# Patient Record
Sex: Male | Born: 1945 | Race: Black or African American | Hispanic: No | Marital: Married | Smoking: Former smoker
Health system: Southern US, Community
[De-identification: ages and names within clinical notes are randomized; demographics above are authoritative.]

## PROBLEM LIST (undated history)

## (undated) DIAGNOSIS — I272 Pulmonary hypertension, unspecified: Secondary | ICD-10-CM

## (undated) DIAGNOSIS — T7840XA Allergy, unspecified, initial encounter: Secondary | ICD-10-CM

## (undated) DIAGNOSIS — I8393 Asymptomatic varicose veins of bilateral lower extremities: Secondary | ICD-10-CM

## (undated) DIAGNOSIS — Z9889 Other specified postprocedural states: Secondary | ICD-10-CM

## (undated) DIAGNOSIS — I739 Peripheral vascular disease, unspecified: Secondary | ICD-10-CM

## (undated) DIAGNOSIS — I82409 Acute embolism and thrombosis of unspecified deep veins of unspecified lower extremity: Secondary | ICD-10-CM

## (undated) HISTORY — DX: Asymptomatic varicose veins of bilateral lower extremities: I83.93

## (undated) HISTORY — PX: CHOLECYSTECTOMY: SHX55

## (undated) HISTORY — DX: Other specified postprocedural states: Z98.890

## (undated) HISTORY — DX: Allergy, unspecified, initial encounter: T78.40XA

## (undated) HISTORY — DX: Acute embolism and thrombosis of unspecified deep veins of unspecified lower extremity: I82.409

## (undated) HISTORY — PX: IVC FILTER PLACEMENT (ARMC HX): HXRAD1551

## (undated) HISTORY — DX: Peripheral vascular disease, unspecified: I73.9

## (undated) HISTORY — PX: HAND AMPUTATION: SUR26

## (undated) HISTORY — PX: FOOT SURGERY: SHX648

## (undated) HISTORY — PX: SPLENECTOMY, TOTAL: SHX788

## (undated) HISTORY — DX: Pulmonary hypertension, unspecified: I27.20

---

## 2009-04-09 ENCOUNTER — Emergency Department (HOSPITAL_COMMUNITY): Admission: EM | Admit: 2009-04-09 | Discharge: 2009-04-09 | Payer: Self-pay | Admitting: Emergency Medicine

## 2009-04-10 ENCOUNTER — Inpatient Hospital Stay (HOSPITAL_COMMUNITY): Admission: EM | Admit: 2009-04-10 | Discharge: 2009-04-20 | Payer: Self-pay | Admitting: Emergency Medicine

## 2009-04-10 ENCOUNTER — Ambulatory Visit: Payer: Self-pay | Admitting: Cardiology

## 2009-04-11 ENCOUNTER — Ambulatory Visit: Payer: Self-pay | Admitting: Internal Medicine

## 2009-04-11 ENCOUNTER — Encounter: Payer: Self-pay | Admitting: Cardiology

## 2009-04-11 ENCOUNTER — Encounter (INDEPENDENT_AMBULATORY_CARE_PROVIDER_SITE_OTHER): Payer: Self-pay | Admitting: Internal Medicine

## 2009-04-11 ENCOUNTER — Ambulatory Visit: Payer: Self-pay | Admitting: Oncology

## 2009-04-12 ENCOUNTER — Ambulatory Visit: Payer: Self-pay | Admitting: Gastroenterology

## 2009-04-13 ENCOUNTER — Ambulatory Visit: Payer: Self-pay | Admitting: Internal Medicine

## 2009-04-21 ENCOUNTER — Encounter: Payer: Self-pay | Admitting: Internal Medicine

## 2009-05-10 ENCOUNTER — Encounter (HOSPITAL_COMMUNITY): Admission: RE | Admit: 2009-05-10 | Discharge: 2009-05-25 | Payer: Self-pay | Admitting: Oncology

## 2009-05-10 ENCOUNTER — Ambulatory Visit (HOSPITAL_COMMUNITY): Payer: Self-pay | Admitting: Oncology

## 2009-05-13 ENCOUNTER — Ambulatory Visit: Payer: Self-pay | Admitting: Internal Medicine

## 2010-12-01 LAB — CBC
MCV: 100.7 fL — ABNORMAL HIGH (ref 78.0–100.0)
Platelets: 407 10*3/uL — ABNORMAL HIGH (ref 150–400)
WBC: 18.2 10*3/uL — ABNORMAL HIGH (ref 4.0–10.5)

## 2010-12-01 LAB — DIFFERENTIAL
Basophils Absolute: 0 10*3/uL (ref 0.0–0.1)
Eosinophils Relative: 4 % (ref 0–5)
Lymphocytes Relative: 13 % (ref 12–46)
Monocytes Absolute: 1.5 10*3/uL — ABNORMAL HIGH (ref 0.1–1.0)

## 2010-12-01 LAB — COMPREHENSIVE METABOLIC PANEL
AST: 22 U/L (ref 0–37)
Albumin: 3.9 g/dL (ref 3.5–5.2)
Chloride: 104 mEq/L (ref 96–112)
Creatinine, Ser: 0.63 mg/dL (ref 0.4–1.5)
GFR calc Af Amer: >60 mL/min (ref >60)
Potassium: 3.3 mEq/L — ABNORMAL LOW (ref 3.5–5.1)
Total Bilirubin: 6.4 mg/dL — ABNORMAL HIGH (ref 0.3–1.2)

## 2010-12-01 LAB — RETICULOCYTES
RBC.: 3.34 MIL/uL — ABNORMAL LOW (ref 4.22–5.81)
Retic Count, Absolute: 203.7 10*3/uL — ABNORMAL HIGH (ref 19.0–186.0)

## 2010-12-02 LAB — COMPREHENSIVE METABOLIC PANEL
ALT: 78 U/L — ABNORMAL HIGH (ref 0–53)
ALT: 83 U/L — ABNORMAL HIGH (ref 0–53)
AST: 22 U/L (ref 0–37)
AST: 28 U/L (ref 0–37)
Albumin: 3.3 g/dL — ABNORMAL LOW (ref 3.5–5.2)
Albumin: 3.3 g/dL — ABNORMAL LOW (ref 3.5–5.2)
Alkaline Phosphatase: 68 U/L (ref 39–117)
Alkaline Phosphatase: 68 U/L (ref 39–117)
Alkaline Phosphatase: 80 U/L (ref 39–117)
Alkaline Phosphatase: 90 U/L (ref 39–117)
Alkaline Phosphatase: 95 U/L (ref 39–117)
BUN: 11 mg/dL (ref 6–23)
BUN: 16 mg/dL (ref 6–23)
BUN: 20 mg/dL (ref 6–23)
CO2: 26 mEq/L (ref 19–32)
CO2: 27 mEq/L (ref 19–32)
Chloride: 106 mEq/L (ref 96–112)
Chloride: 106 mEq/L (ref 96–112)
Chloride: 107 mEq/L (ref 96–112)
Chloride: 109 mEq/L (ref 96–112)
Creatinine, Ser: 0.68 mg/dL (ref 0.4–1.5)
Creatinine, Ser: 0.95 mg/dL (ref 0.4–1.5)
GFR calc Af Amer: >60 mL/min (ref >60)
GFR calc Af Amer: >60 mL/min (ref >60)
GFR calc non Af Amer: >60 mL/min (ref >60)
GFR calc non Af Amer: >60 mL/min (ref >60)
Glucose, Bld: 103 mg/dL — ABNORMAL HIGH (ref 70–99)
Glucose, Bld: 120 mg/dL — ABNORMAL HIGH (ref 70–99)
Glucose, Bld: 129 mg/dL — ABNORMAL HIGH (ref 70–99)
Potassium: 3.5 mEq/L (ref 3.5–5.1)
Potassium: 3.8 mEq/L (ref 3.5–5.1)
Potassium: 3.9 mEq/L (ref 3.5–5.1)
Potassium: 4 mEq/L (ref 3.5–5.1)
Sodium: 137 mEq/L (ref 135–145)
Sodium: 138 mEq/L (ref 135–145)
Sodium: 138 mEq/L (ref 135–145)
Total Bilirubin: 3.2 mg/dL — ABNORMAL HIGH (ref 0.3–1.2)
Total Bilirubin: 5 mg/dL — ABNORMAL HIGH (ref 0.3–1.2)
Total Bilirubin: 7.6 mg/dL — ABNORMAL HIGH (ref 0.3–1.2)
Total Bilirubin: 8 mg/dL — ABNORMAL HIGH (ref 0.3–1.2)
Total Protein: 5.8 g/dL — ABNORMAL LOW (ref 6.0–8.3)
Total Protein: 5.8 g/dL — ABNORMAL LOW (ref 6.0–8.3)

## 2010-12-02 LAB — PROTIME-INR: Prothrombin Time: 13.4 seconds (ref 11.6–15.2)

## 2010-12-02 LAB — HEMOGLOBINOPATHY EVALUATION: Hgb A2 Quant: 3.1 % (ref 2.2–3.2)

## 2010-12-02 LAB — RETICULOCYTES
RBC.: 2.85 MIL/uL — ABNORMAL LOW (ref 4.22–5.81)
RBC.: 2.94 MIL/uL — ABNORMAL LOW (ref 4.22–5.81)
Retic Count, Absolute: 128.3 10*3/uL (ref 19.0–186.0)
Retic Ct Pct: 4.5 % — ABNORMAL HIGH (ref 0.4–3.1)
Retic Ct Pct: 5.3 % — ABNORMAL HIGH (ref 0.4–3.1)

## 2010-12-02 LAB — WOUND CULTURE

## 2010-12-02 LAB — CBC
HCT: 25.3 % — ABNORMAL LOW (ref 39.0–52.0)
HCT: 25.9 % — ABNORMAL LOW (ref 39.0–52.0)
HCT: 26 % — ABNORMAL LOW (ref 39.0–52.0)
HCT: 26.8 % — ABNORMAL LOW (ref 39.0–52.0)
HCT: 29.6 % — ABNORMAL LOW (ref 39.0–52.0)
Hemoglobin: 11 g/dL — ABNORMAL LOW (ref 13.0–17.0)
Hemoglobin: 11.6 g/dL — ABNORMAL LOW (ref 13.0–17.0)
Hemoglobin: 9.2 g/dL — ABNORMAL LOW (ref 13.0–17.0)
Hemoglobin: 9.4 g/dL — ABNORMAL LOW (ref 13.0–17.0)
Hemoglobin: 9.7 g/dL — ABNORMAL LOW (ref 13.0–17.0)
MCHC: 35.6 g/dL (ref 30.0–36.0)
MCHC: 36.2 g/dL — ABNORMAL HIGH (ref 30.0–36.0)
MCHC: 36.5 g/dL — ABNORMAL HIGH (ref 30.0–36.0)
MCV: 109.5 fL — ABNORMAL HIGH (ref 78.0–100.0)
MCV: 109.6 fL — ABNORMAL HIGH (ref 78.0–100.0)
MCV: 111.5 fL — ABNORMAL HIGH (ref 78.0–100.0)
Platelets: 320 10*3/uL (ref 150–400)
Platelets: 320 10*3/uL (ref 150–400)
Platelets: 363 10*3/uL (ref 150–400)
Platelets: 382 10*3/uL (ref 150–400)
Platelets: 406 10*3/uL — ABNORMAL HIGH (ref 150–400)
Platelets: 479 10*3/uL — ABNORMAL HIGH (ref 150–400)
RBC: 2.26 MIL/uL — ABNORMAL LOW (ref 4.22–5.81)
RBC: 2.33 MIL/uL — ABNORMAL LOW (ref 4.22–5.81)
RBC: 2.44 MIL/uL — ABNORMAL LOW (ref 4.22–5.81)
RBC: 2.85 MIL/uL — ABNORMAL LOW (ref 4.22–5.81)
RBC: 2.89 MIL/uL — ABNORMAL LOW (ref 4.22–5.81)
RDW: 19.4 % — ABNORMAL HIGH (ref 11.5–15.5)
RDW: 20.7 % — ABNORMAL HIGH (ref 11.5–15.5)
RDW: 22.1 % — ABNORMAL HIGH (ref 11.5–15.5)
RDW: 23.3 % — ABNORMAL HIGH (ref 11.5–15.5)
RDW: 24.1 % — ABNORMAL HIGH (ref 11.5–15.5)
WBC: 23 10*3/uL — ABNORMAL HIGH (ref 4.0–10.5)
WBC: 23.4 10*3/uL — ABNORMAL HIGH (ref 4.0–10.5)
WBC: 24.1 10*3/uL — ABNORMAL HIGH (ref 4.0–10.5)
WBC: 27.9 10*3/uL — ABNORMAL HIGH (ref 4.0–10.5)
WBC: 28 10*3/uL — ABNORMAL HIGH (ref 4.0–10.5)
WBC: 30.4 10*3/uL — ABNORMAL HIGH (ref 4.0–10.5)

## 2010-12-02 LAB — BASIC METABOLIC PANEL
BUN: 11 mg/dL (ref 6–23)
BUN: 9 mg/dL (ref 6–23)
CO2: 25 mEq/L (ref 19–32)
Calcium: 8.7 mg/dL (ref 8.4–10.5)
Calcium: 8.9 mg/dL (ref 8.4–10.5)
Chloride: 105 mEq/L (ref 96–112)
Chloride: 108 mEq/L (ref 96–112)
Creatinine, Ser: 0.73 mg/dL (ref 0.4–1.5)
Creatinine, Ser: 0.76 mg/dL (ref 0.4–1.5)
Creatinine, Ser: 0.78 mg/dL (ref 0.4–1.5)
GFR calc Af Amer: >60 mL/min (ref >60)
GFR calc non Af Amer: >60 mL/min (ref >60)
Glucose, Bld: 95 mg/dL (ref 70–99)
Sodium: 136 mEq/L (ref 135–145)

## 2010-12-02 LAB — DIFFERENTIAL
Basophils Absolute: 0 10*3/uL (ref 0.0–0.1)
Basophils Absolute: 0 10*3/uL (ref 0.0–0.1)
Basophils Absolute: 0 10*3/uL (ref 0.0–0.1)
Basophils Absolute: 0 10*3/uL (ref 0.0–0.1)
Basophils Absolute: 0 10*3/uL (ref 0.0–0.1)
Basophils Absolute: 0 10*3/uL (ref 0.0–0.1)
Basophils Absolute: 0.1 10*3/uL (ref 0.0–0.1)
Basophils Relative: 0 % (ref 0–1)
Basophils Relative: 0 % (ref 0–1)
Basophils Relative: 0 % (ref 0–1)
Basophils Relative: 0 % (ref 0–1)
Basophils Relative: 0 % (ref 0–1)
Basophils Relative: 0 % (ref 0–1)
Basophils Relative: 0 % (ref 0–1)
Basophils Relative: 0 % (ref 0–1)
Blasts: 0 %
Eosinophils Absolute: 0.2 10*3/uL (ref 0.0–0.7)
Eosinophils Absolute: 0.2 10*3/uL (ref 0.0–0.7)
Eosinophils Absolute: 0.2 10*3/uL (ref 0.0–0.7)
Eosinophils Absolute: 0.3 10*3/uL (ref 0.0–0.7)
Eosinophils Absolute: 0.3 10*3/uL (ref 0.0–0.7)
Eosinophils Relative: 0 % (ref 0–5)
Eosinophils Relative: 0 % (ref 0–5)
Eosinophils Relative: 0 % (ref 0–5)
Eosinophils Relative: 1 % (ref 0–5)
Eosinophils Relative: 1 % (ref 0–5)
Lymphocytes Relative: 10 % — ABNORMAL LOW (ref 12–46)
Lymphocytes Relative: 10 % — ABNORMAL LOW (ref 12–46)
Lymphocytes Relative: 10 % — ABNORMAL LOW (ref 12–46)
Lymphocytes Relative: 12 % (ref 12–46)
Lymphocytes Relative: 4 % — ABNORMAL LOW (ref 12–46)
Lymphocytes Relative: 6 % — ABNORMAL LOW (ref 12–46)
Lymphocytes Relative: 8 % — ABNORMAL LOW (ref 12–46)
Lymphs Abs: 1 10*3/uL (ref 0.7–4.0)
Lymphs Abs: 2.8 10*3/uL (ref 0.7–4.0)
Lymphs Abs: 2.8 10*3/uL (ref 0.7–4.0)
Monocytes Absolute: 0.7 10*3/uL (ref 0.1–1.0)
Monocytes Absolute: 2.2 10*3/uL — ABNORMAL HIGH (ref 0.1–1.0)
Monocytes Absolute: 3 10*3/uL — ABNORMAL HIGH (ref 0.1–1.0)
Monocytes Absolute: 3.4 10*3/uL — ABNORMAL HIGH (ref 0.1–1.0)
Monocytes Relative: 1 % — ABNORMAL LOW (ref 3–12)
Monocytes Relative: 10 % (ref 3–12)
Monocytes Relative: 11 % (ref 3–12)
Monocytes Relative: 12 % (ref 3–12)
Monocytes Relative: 3 % (ref 3–12)
Monocytes Relative: 7 % (ref 3–12)
Monocytes Relative: 8 % (ref 3–12)
Neutro Abs: 21.6 10*3/uL — ABNORMAL HIGH (ref 1.7–7.7)
Neutro Abs: 22.7 10*3/uL — ABNORMAL HIGH (ref 1.7–7.7)
Neutro Abs: 24.7 10*3/uL — ABNORMAL HIGH (ref 1.7–7.7)
Neutro Abs: 25.7 10*3/uL — ABNORMAL HIGH (ref 1.7–7.7)
Neutrophils Relative %: 80 % — ABNORMAL HIGH (ref 43–77)
Neutrophils Relative %: 81 % — ABNORMAL HIGH (ref 43–77)
Neutrophils Relative %: 81 % — ABNORMAL HIGH (ref 43–77)
Neutrophils Relative %: 87 % — ABNORMAL HIGH (ref 43–77)
Neutrophils Relative %: 90 % — ABNORMAL HIGH (ref 43–77)
Neutrophils Relative %: 92 % — ABNORMAL HIGH (ref 43–77)
nRBC: 0 /100 WBC

## 2010-12-02 LAB — CULTURE, BLOOD (ROUTINE X 2)

## 2010-12-02 LAB — URINE CULTURE

## 2010-12-02 LAB — URINALYSIS, ROUTINE W REFLEX MICROSCOPIC
Glucose, UA: NEGATIVE mg/dL
Protein, ur: NEGATIVE mg/dL
Urobilinogen, UA: 1 mg/dL (ref 0.0–1.0)

## 2010-12-02 LAB — ANA: Anti Nuclear Antibody(ANA): NEGATIVE

## 2010-12-02 LAB — DIRECT ANTIGLOBULIN TEST (NOT AT ARMC): DAT, complement: NEGATIVE

## 2010-12-02 LAB — HIV ANTIBODY (ROUTINE TESTING W REFLEX): HIV: NONREACTIVE

## 2010-12-02 LAB — CARDIAC PANEL(CRET KIN+CKTOT+MB+TROPI)
CK, MB: 1.3 ng/mL (ref 0.3–4.0)
Total CK: 37 U/L (ref 7–232)
Troponin I: 0.15 ng/mL — ABNORMAL HIGH (ref 0.00–0.06)
Troponin I: 0.16 ng/mL — ABNORMAL HIGH (ref 0.00–0.06)

## 2010-12-02 LAB — BLOOD GAS, ARTERIAL
Acid-Base Excess: 0.3 mmol/L (ref 0.0–2.0)
Bicarbonate: 23.7 mEq/L (ref 20.0–24.0)
TCO2: 22.1 mmol/L (ref 0–100)
pCO2 arterial: 33.4 mmHg — ABNORMAL LOW (ref 35.0–45.0)

## 2010-12-02 LAB — LACTATE DEHYDROGENASE: LDH: 273 U/L — ABNORMAL HIGH (ref 94–250)

## 2010-12-02 LAB — PHOSPHORUS: Phosphorus: 4.8 mg/dL — ABNORMAL HIGH (ref 2.3–4.6)

## 2010-12-02 LAB — CK: Total CK: 13 U/L (ref 7–232)

## 2010-12-02 LAB — RHEUMATOID FACTOR: Rhuematoid fact SerPl-aCnc: 20 IU/mL (ref 0–20)

## 2010-12-02 LAB — GAMMA GT: GGT: 97 U/L — ABNORMAL HIGH (ref 7–51)

## 2010-12-02 LAB — VANCOMYCIN, TROUGH: Vancomycin Tr: 6.2 ug/mL — ABNORMAL LOW (ref 10.0–20.0)

## 2010-12-02 LAB — ALPHA-1-ANTITRYPSIN: A-1 Antitrypsin, Ser: 166 mg/dL (ref 83–200)

## 2010-12-02 LAB — SEDIMENTATION RATE: Sed Rate: 31 mm/hr — ABNORMAL HIGH (ref 0–16)

## 2010-12-03 LAB — URINE MICROSCOPIC-ADD ON

## 2010-12-03 LAB — FERRITIN: Ferritin: 1188 ng/mL — ABNORMAL HIGH (ref 22–322)

## 2010-12-03 LAB — COMPREHENSIVE METABOLIC PANEL
AST: 64 U/L — ABNORMAL HIGH (ref 0–37)
Albumin: 3.3 g/dL — ABNORMAL LOW (ref 3.5–5.2)
Calcium: 8.7 mg/dL (ref 8.4–10.5)
Creatinine, Ser: 1.07 mg/dL (ref 0.4–1.5)
GFR calc Af Amer: >60 mL/min (ref >60)
GFR calc non Af Amer: >60 mL/min (ref >60)
Sodium: 139 mEq/L (ref 135–145)
Total Protein: 6.5 g/dL (ref 6.0–8.3)

## 2010-12-03 LAB — RETICULOCYTES
RBC.: 2.52 MIL/uL — ABNORMAL LOW (ref 4.22–5.81)
Retic Count, Absolute: 473.8 10*3/uL — ABNORMAL HIGH (ref 19.0–186.0)
Retic Ct Pct: 18.8 % — ABNORMAL HIGH (ref 0.4–3.1)

## 2010-12-03 LAB — CBC
HCT: 26.4 % — ABNORMAL LOW (ref 39.0–52.0)
MCHC: 37.2 g/dL — ABNORMAL HIGH (ref 30.0–36.0)
MCV: 107.4 fL — ABNORMAL HIGH (ref 78.0–100.0)
Platelets: 349 10*3/uL (ref 150–400)
Platelets: 353 10*3/uL (ref 150–400)
RDW: 20.6 % — ABNORMAL HIGH (ref 11.5–15.5)
WBC: 34.1 10*3/uL — ABNORMAL HIGH (ref 4.0–10.5)

## 2010-12-03 LAB — DIFFERENTIAL
Basophils Absolute: 0 10*3/uL (ref 0.0–0.1)
Basophils Absolute: 0 10*3/uL (ref 0.0–0.1)
Basophils Relative: 0 % (ref 0–1)
Eosinophils Relative: 1 % (ref 0–5)
Lymphocytes Relative: 10 % — ABNORMAL LOW (ref 12–46)
Lymphocytes Relative: 8 % — ABNORMAL LOW (ref 12–46)
Monocytes Relative: 5 % (ref 3–12)
Monocytes Relative: 8 % (ref 3–12)
Neutro Abs: 22.4 10*3/uL — ABNORMAL HIGH (ref 1.7–7.7)

## 2010-12-03 LAB — LIPID PANEL
HDL: 31 mg/dL — ABNORMAL LOW (ref >39)
Total CHOL/HDL Ratio: 3.5 RATIO
Triglycerides: 115 mg/dL (ref <150)
VLDL: 23 mg/dL (ref 0–40)

## 2010-12-03 LAB — CULTURE, BLOOD (ROUTINE X 2): Report Status: 8212010

## 2010-12-03 LAB — CARDIAC PANEL(CRET KIN+CKTOT+MB+TROPI)
CK, MB: 1.5 ng/mL (ref 0.3–4.0)
Total CK: 51 U/L (ref 7–232)

## 2010-12-03 LAB — POCT CARDIAC MARKERS
CKMB, poc: 1.2 ng/mL (ref 1.0–8.0)
Myoglobin, poc: 83.7 ng/mL (ref 12–200)
Troponin i, poc: <0.05 ng/mL (ref 0.00–0.09)

## 2010-12-03 LAB — HEPATITIS PANEL, ACUTE: Hepatitis B Surface Ag: NEGATIVE

## 2010-12-03 LAB — URINALYSIS, ROUTINE W REFLEX MICROSCOPIC
Ketones, ur: NEGATIVE mg/dL
Leukocytes, UA: NEGATIVE
Nitrite: NEGATIVE
Specific Gravity, Urine: 1.015 (ref 1.005–1.030)
pH: 6 (ref 5.0–8.0)

## 2010-12-03 LAB — BASIC METABOLIC PANEL
BUN: 22 mg/dL (ref 6–23)
Creatinine, Ser: 1.21 mg/dL (ref 0.4–1.5)
GFR calc non Af Amer: >60 mL/min (ref >60)
Potassium: 2.7 mEq/L — CL (ref 3.5–5.1)

## 2010-12-03 LAB — FOLATE: Folate: 6.1 ng/mL

## 2010-12-03 LAB — D-DIMER, QUANTITATIVE: D-Dimer, Quant: 1.62 ug/mL-FEU — ABNORMAL HIGH (ref 0.00–0.48)

## 2010-12-03 LAB — SICKLE CELL SCREEN: Sickle Cell Screen: POSITIVE — AB

## 2010-12-03 LAB — BRAIN NATRIURETIC PEPTIDE: Pro B Natriuretic peptide (BNP): 214 pg/mL — ABNORMAL HIGH (ref 0.0–100.0)

## 2010-12-03 LAB — HEPATIC FUNCTION PANEL
Alkaline Phosphatase: 76 U/L (ref 39–117)
Indirect Bilirubin: 8.3 mg/dL — ABNORMAL HIGH (ref 0.3–0.9)
Total Protein: 6.4 g/dL (ref 6.0–8.3)

## 2010-12-03 LAB — IRON AND TIBC
TIBC: 279 ug/dL (ref 215–435)
UIBC: 159 ug/dL

## 2010-12-03 LAB — APTT: aPTT: 34 seconds (ref 24–37)

## 2010-12-03 LAB — LACTATE DEHYDROGENASE: LDH: 343 U/L — ABNORMAL HIGH (ref 94–250)

## 2010-12-03 LAB — BILIRUBIN, FRACTIONATED(TOT/DIR/INDIR): Indirect Bilirubin: 8.1 mg/dL — ABNORMAL HIGH (ref 0.3–0.9)

## 2010-12-03 LAB — POTASSIUM: Potassium: 4.3 mEq/L (ref 3.5–5.1)
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
## 2011-01-09 NOTE — Group Therapy Note (Signed)
NAMEGARRISON, [name]            ACCOUNT NO.:  [id number]   MEDICAL RECORD NO.:  [id number]          PATIENT TYPE:  INP   LOCATION:  A303                          FACILITY:  [facility]   PHYSICIAN:  Melissa L. Ladona Ridgel, MD  DATE OF BIRTH:  June 09, 1946   DATE OF PROCEDURE:  04/16/2009  DATE OF DISCHARGE:                                 PROGRESS NOTE   SUBJECTIVE:  The patient is found lying in bed, again writhing around in  pain.  The patient states that he slept last night and that this morning  he again is having discomfort in his arms and his legs and it is  relatively intolerable.  His reaction appears to be out of proportion to  his physiological examination, but we will give him the benefit of the  doubt.  He denies nausea or vomiting.  He denies chest pain or shortness  of breath.  He states that he ate his breakfast quite well.   OBJECTIVE:  VITAL SIGNS:  Temperature was 97.5, blood pressure 121/76,  pulse 97, respirations 20, saturation 97%.  His temperature max  yesterday was 99.4.  GENERAL:  This is a moderately nourished [place] American male, who is  writhing round in the bed complaining of pain in his arms and his legs.  HEENT:  He is normocephalic, atraumatic.  Pupils are equal, round, and  reactive.  He has anicteric sclerae at this time.  Examination of the  nose reveals septum midline without discharge.  Examination of the mouth  reveals poor dentition with no oral lesions or lip lesions.  NECK:  Supple.  There is no JVD, no lymph nodes, no carotid bruits.  CHEST:  Decreased, but clear to auscultation.  There is no rhonchi,  rales, or wheezes.  CARDIOVASCULAR:  Regular rate, rhythm, positive S1 S2, no S3 S4, no  murmurs, rubs, or gallops.  ABDOMEN:  Soft, nondistended with positive  bowel sounds.  There is no hepatosplenomegaly, no guarding, no rebound.  EXTREMITIES:  The right ankle is split open but starting to granulate  in.  There is no purulent material.  There is no  erythema.   Examination of the right forearm reveals an infected site for an IV,  which was discontinued.  There appears to be some purulent material and  cellulitis related to that.  A culture was collected.   Otherwise, he has 2+ radial pulses, 1+ dorsalis pedis pulses.   He does not have any clubbing, cyanosis, or edema.  He does have a left  hand that is amputated.   PSYCHIATRIC:  Affect seems a little bit incongruent with the current  physical exam.  Recent and remote memory are intact.  Judgment and  insight appear to be intact, although at times he does joke a lot when  he is not in pain.   PERTINENT LABORATORY DATA:  Blood cultures have remained negative.  CBC  shows a white count of 23,000 with a hemoglobin of 11.6, hematocrit  31.7.  HIV is nonreactive.  Rheumatoid arthritis factor is 20, T3 is  3.3, T4 is 1.19, ESR is elevated at 31,  LDH is 273.   ASSESSMENT AND PLAN:  This is a 65 year old African American male,  presented after becoming short of breath while mowing the lawn.  In the  emergency room, he was found to be excessively hypoxic without evidence  for pneumonia, but he was found to be anemic with abnormal blood cells.  The patient has been evaluated and determined it is likely he has a  variant of sickle cell disease.  Dr. [name] has been following along  and elected to start him on prednisone to treat what he feels is  probably more related to the hemolytic anemia than the sickle cell  disease.  The patient remains stable with regard to his cardiomyopathy  and Cardiology has been following and titrating his Coreg and ACE  inhibitor.   Dr. [name] feels that there is only a mild component of chronic  obstructive pulmonary disease.  The recommendation is that he start  Spiriva as an outpatient and that he continue with home O2 as needed.   1. Hemolytic anemia:  Currently, the patient has been started on      prednisone.  We will continue to follow him as  this would be dose      #2 today.  We will see if he shows any improvement in his counts.      Dr. [name] has seen the patient.  I await written documentation      of his recommendations but have discussed the case with him.  The      patient will remain on a proton pump inhibitor while on steroids.  2. Transaminitis:  The hemolytic anemia does explain that, however, we      will continue to follow that with serial values as we treat him and      see if that improves.  3. Polyuria:  His urinalysis was completely negative.  I resumed his      intravenous fluids when he started having pain yesterday and it did      seem to help a little, but they actually had been discontinued.  4. Cardiomyopathy:  We will continue his ACE and his Coreg.  5. Infection in the right antecubital:  In light of the fact that we      are suspicious for infection as a source for his hemolytic anemia,      I will go ahead and cover this gentleman with vancomycin and      Levaquin.  We will follow up on the culture from the arm and see if      he develops any clinical improvement.  We will continue with      Dilaudid in moderate doses.   Total time on this case was 30 minutes      Melissa L. Ladona Ridgel, MD  Electronically Signed     MLT/MEDQ  D:  04/16/2009  T:  04/16/2009  Job:  [id number]

## 2011-01-09 NOTE — Procedures (Signed)
NAMEDEITRICK, FERRERI            ACCOUNT NO.:  [id number]   MEDICAL RECORD NO.:  [id number]          PATIENT TYPE:  INP   LOCATION:  A303                          FACILITY:  [facility]   PHYSICIAN:  Edward L. [name], M.D.DATE OF BIRTH:  1946/02/03   DATE OF PROCEDURE:  04/15/2009  DATE OF DISCHARGE:                            PULMONARY FUNCTION TEST   1. Spirometry shows a moderate ventilatory defect with evidence of      airflow obstruction most marked in the smaller airways.  2. There was no significant bronchodilator improvement and, in fact,      pulmonary function worsens post bronchodilator.      Edward L. [name], M.D.  Electronically Signed     ELH/MEDQ  D:  04/15/2009  T:  04/15/2009  Job:  [id number]

## 2011-01-09 NOTE — Group Therapy Note (Signed)
[name], [name]            ACCOUNT NO.:  [id number]   MEDICAL RECORD NO.:  [id number]          PATIENT TYPE:  INP   LOCATION:  A303                          FACILITY:  [facility]   PHYSICIAN:  Edward L. [name], M.D.DATE OF BIRTH:  [birth year]   DATE OF PROCEDURE:  DATE OF DISCHARGE:                                 PROGRESS NOTE   Patient of the hospitalist.  [name] overall seems to be doing  better.  He has had multiple consultations including GI, Pulmonary,  Cardiology, and he seems to have a multifactorial cause of his hypoxia.  He may need to go home on oxygen, but if he improves his cardiac  pulmonary situation, reduce his alcohol consumption, stop his smoking,  etc.  He may be able to come off all that.  I do not think there is  anything else to add at this point.  I am going to follow more  peripherally.  We will be glad to see him at anytime at your request.      Ramon Dredge L. [name], M.D.  Electronically Signed     ELH/MEDQ  D:  04/15/2009  T:  04/15/2009  Job:  [id number]

## 2011-01-09 NOTE — Group Therapy Note (Signed)
[name], [name]            ACCOUNT NO.:  [id number]   MEDICAL RECORD NO.:  [id number]          PATIENT TYPE:  INP   LOCATION:  A303                          FACILITY:  [facility]   PHYSICIAN:  Melissa L. Ladona Ridgel, MD  DATE OF BIRTH:  11/07/1945   DATE OF PROCEDURE:  04/15/2009  DATE OF DISCHARGE:                                 PROGRESS NOTE   NOTATION:  Please see the accompanying written material in the chart.   SUBJECTIVE:  The patient was found lying in bed, rolling back and forth  and complaining of severe pain in his thighs and in his shoulders.  He  denied chest pain or shortness of breath.  The patient was treated with  1 mg of Dilaudid, with some improvement in his pain.  The patient stated  that he had not had a bowel movement today, but was not feeling  uncomfortable related to that.  He denied any cough, nausea, vomiting or  diarrhea.   I reviewed the case with Dr. Ladona [name], who has elected to start  the patient on prednisone to treat his hemolytic component, and we  discussed how this likely may not represent sickle cell disease at all.  The patient does not appear to have any sources of infection at this  time.  We will continue to monitor him closely.  I did repeat his  urinalysis to assure that we were not looking at an atypical organism,  causing his hemolytic anemia, and at this time I again, I do not have a  source for infection.   OBJECTIVE:  VITAL SIGNS:  T-max has been 99.4, blood pressure 108/68,  pulse 92, respirations 18, saturation 96%.  GENERAL:  This is a an ill-appearing African American male, in moderate  distress secondary to pain in his arms and legs, describes as a crampy  aching feeling.  He is rolling around moaning.  HEENT:  The patient is normocephalic, atraumatic.  He tends to close his  right eye when he has a conversation with you, but in general he denies  any visual loss in that eye.  His pupils are equal, round, reactive to  light.  Extraocular muscles appear to be intact.  Anicteric sclerae.  Examination of the nose reveals the septum midline, without discharge.  Examination of mouth reveals poor dentition, with no oral lesions or lip  lesions.  NECK:  Is supple.  There is no JVD.  No lymph nodes.  No carotid bruits.  CHEST:  Decreased but clear to auscultation.  There is no rhonchi, rales  or wheezes.  CARDIOVASCULAR:  A regular rate and rhythm.  Positive S1 and S2, no S3  or S4 and no murmurs, rubs or gallops.  ABDOMEN:  Is soft, nondistended, with positive bowel sounds.  There is  no hepatosplenomegaly, no guarding or rebound.  I do not appreciate any  bruits in the abdomen.  EXTREMITIES:  Shows right ankle wound, continuing to granulate in  nicely.  There is no discharge.  The area of the wound itself is  starting to scab over.  He has 2+ radial  pulses, 1+ dorsalis pedis  pulses.  PSYCH:  Affect is impaired by pain at this time.  There is an element of  increased agitation, related to his pain.   LABORATORY DATA:  His urinalysis as stated, repeat was negative.  His  blood cultures drawn on admission were negative, but his white count  remains 23.9, with a hemoglobin of 11.5, hematocrit 32.2, platelets of  382.  PTT was 31.  PT was 13.4 with an INR of 1.  The reticulocyte count  was 5.3.  A repeat urinalysis again was negative.  His sodium is 138,  potassium 4.8, chloride 104, CO2 of 28, BUN 11, creatinine 0.73 and  glucose of 95.   Please note that I reviewed this case with Dr. [name], and he has  recommended checking an HIV, an ESR and LDH, and to assess the hemolytic  component.  He has started him on prednisone and we will follow and see  how he does with regard to that date.   ASSESSMENT/PLAN:  This is a 65 year old African American male who  presented after becoming short of breath while mowing his lawn.  In the  emergency room, he was found to be excessively hypoxic, without evidence  for  any pneumonia.  He was found to be anemic, with an abnormal blood  dyscrasia, which included an elevation in his leukocyte count, but an  anemia and evidence for a hemolytic anemia.  Cardiology was consulted  because he was found to have a cardiomyopathy.  At this time I have  determined him not to be a candidate for an antiplatelet agent.  He was,  however, started on Coreg and an ACE inhibitor.  Dr. [name] has  evaluated his pulmonary status, and feels that there is a mild component  of obstructive disease, but no other source for his hypoxemia has been  noted, other than to say that it is likely multifactorial.  The patient  complained of dysphagia and Dr. [name] and evaluated him.  He  states he likely will need an upper endoscopy at some time.  The patient  is tolerating a modified pureed diet.   IMPRESSION:  1. Cardiomyopathy, currently stable:  Continue the Coreg and      lisinopril.  2. Sickle cell disease with hemolytic anemia, of unclear etiology:      The patient has been started on prednisone and will be continued on      a proton pump inhibitor while on steroids.  3. Dysphagia:  The patient will ultimately need an EGD.  Right now he      is eating well.  Before his elevated leukocytes, with only evidence      for low-grade temperature, no obvious source for infection has been      found at this time.  Will continue to monitor him and to have a low      threshold for treating him with antibiotic therapy.  4. Right ankle wound:  Continues to the heel.  5. Dyspnea:  Is improving.  He is tolerating O2 well.  6. Hyperkalemia:  Is being repleted appropriately.  7. Transaminitis:  This continues to persistent and is related to his      hemolytic anemia.  8. New pain, of unclear etiology:  I am very concerned that there is      something that is exacerbating his current symptoms, but for now      will treat him symptomatically and monitor him.  The total time on this  case was 40 minutes.      Melissa L. Ladona Ridgel, MD  Electronically Signed     MLT/MEDQ  D:  04/16/2009  T:  04/16/2009  Job:  [id number]
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
## 2011-01-09 NOTE — Group Therapy Note (Signed)
[name], [name]            ACCOUNT NO.:  [id number]   MEDICAL RECORD NO.:  [id number]          PATIENT TYPE:  INP   LOCATION:  A303                          FACILITY:  [facility]   PHYSICIAN:  Melissa L. Ladona Ridgel, MD  DATE OF BIRTH:  [birth year]   DATE OF PROCEDURE:  04/14/2009  DATE OF DISCHARGE:                                 PROGRESS NOTE   Please see the accompanying written note.   Subjectively the patient is found lying in bed.  He seems much more  comfortable than yesterday.  He was actively eating his meal without  difficulty swallowing.  He denied any cough, chest pain, shortness of  breath.  His abdominal pain is improved after having multiple bowel  movements during the night.   PHYSICAL EXAMINATION:  VITAL SIGNS:  T-max was 99.9, today is 98.2,  blood pressures have been 91/56 to 128/82, pulse 92, respirations 16,  saturation 94% to 97% on 4 liters.  GENERAL:  This is a cachectic African American male in no acute  distress.  HEENT:  He is normocephalic, atraumatic.  Pupils equal, round, and  reactive to light.  Extraocular muscles are intact.  He has anicteric  sclerae.  Examination of the nose reveals septum midline without  discharge.  Examination of the mouth reveals no oral lesions or lip  lesions.  NECK:  Supple.  There is no JVD, no lymph nodes.  CHEST:  Decreased, but clear to auscultation.  There are no rhonchi,  rales, or wheezes.  CARDIOVASCULAR:  Regular rate and rhythm, positive S1 and S2.  No S3 or  S4.  No murmurs, rubs, or gallops.  ABDOMEN:  Soft, nondistended with positive bowel sounds.  There is no  hepatosplenomegaly.  No guarding, no rebound.  EXTREMITIES:  The right ankle appears to be granulating in well.  There  is no discharge.  The wound itself is actually starting to scab over.  He has 2+ radial pulses, 1+ dorsalis pedis pulses.  PSYCHIATRIC:  Affect is appropriate.  Recent and remote memory intact.  Judgment and insight intact.   PERTINENT LABORATORY DATA:  None today.  His last hemoglobin  electrophoresis reveals hemoglobin-S is elevated at 3.97, hemoglobin-F  is also elevated at 1.3, and hemoglobin-A is elevated at 55.9.   Blood cultures have remained negative to date.   ASSESSMENT AND PLAN:  This 65 year old African American male presented  after becoming more short of breath after mowing his lawn.  In the  emergency room was found to be excessively hypoxic without evidence for  pneumonia and was found to be anemic with abnormal blood dyscrasia.  The  patient has been seen and evaluated, determined that he likely has a  variant of sickle cell disease.  Dr. [name] has been evaluating him.  Cardiology had also been involved because he was found to have a  cardiomyopathy with an ejection fraction of 40% to 50%.  It was  determined by Cardiology that because of his pattern of hemolytic anemia  that he would not be a candidate for antiplatelet, but he was started on  Coreg, which has been  up-titrated.  He also has been started on an ACE  inhibitor and we will continue to monitor his blood pressure.   Dr. [name] became involved because of his severe hypoxia.  His PFTs are  suggesting that there is possibly an element of chronic obstructive  pulmonary disease and pulmonary hypertension.   Dr. Jena Gauss has become involved because of dysphagia and the patient  ultimately will likely need to have an upper esophagogastroduodenoscopy.  We have modified his diet to account for the difficulty swallowing and  he seems to be tolerating that well.   1. Cardiomyopathy.  Currently stable.  Started on lisinopril.      Continue his Coreg.  2. Sickle cell disease with hemolytic anemia of unclear etiology.      Will continue to investigate the causes and reason for these      changes.  3. Dysphagia.  The patient will ultimately need an      esophagogastroduodenoscopy.  For now he is eating a modified diet      without  difficulty.  4. Elevated white count without evidence for fever.  Will go ahead and      send off a UA, culture and sensitivity, as it appears that one      earlier in the admission may have suggested a urinary tract      infection, although his leukocyte esterase and nitrites were      negative, so we will go ahead and repeat that and if it appears      that it is an active urine, we will go ahead and treat.  5. Right ankle wound.  Continues to heal well.  6. Dyspnea is improved on O2.  He likely will need home oxygen  7. Hypokalemia has been repleted appropriately.  8. Transaminitis.  This appears to be persistent.  Will discuss.  This      is likely related to his hemolytic anemia.   Total time on this case was 30 minutes.      Melissa L. Ladona Ridgel, MD  Electronically Signed     MLT/MEDQ  D:  04/15/2009  T:  04/15/2009  Job:  [id number]

## 2011-01-09 NOTE — Group Therapy Note (Signed)
NAMEDONAVAN, KERLIN            ACCOUNT NO.:  [id number]   MEDICAL RECORD NO.:  [id number]          PATIENT TYPE:  INP   LOCATION:  A303                          FACILITY:  [facility]   PHYSICIAN:  Edward L. [name], M.D.DATE OF BIRTH:  Nov 07, 1945   DATE OF PROCEDURE:  DATE OF DISCHARGE:                                 PROGRESS NOTE   Patient of the hospitalist.   Mr. Terlizzi seems to be doing about the same. His O2 is much better.  He still has other problems of course including his hemolytic anemia,  etc.  My plan is that I am going to sign off at this point, but I will  be happy to see him at your request.      Ramon Dredge L. [name], M.D.  Electronically Signed     ELH/MEDQ  D:  04/17/2009  T:  04/17/2009  Job:  [id number]
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
## 2011-01-09 NOTE — Discharge Summary (Signed)
NAMEJERREN, [name]            ACCOUNT NO.:  [id number]   MEDICAL RECORD NO.:  [id number]          PATIENT TYPE:  INP   LOCATION:  A303                          FACILITY:  [facility]   PHYSICIAN:  Lonia Blood, M.D.      DATE OF BIRTH:  05-Jun-1946   DATE OF ADMISSION:  04/10/2009  DATE OF DISCHARGE:  08/25/2010LH                               DISCHARGE SUMMARY   ADDENDUM:   PRIMARY CARE PHYSICIAN:  Dr. Jorene Guest at [facility]   Please refer to dictated discharge summary by Dr. Ladona Ridgel today.  This is  an addendum.  The patient is going to be discharged on Ancef 1 gm q.8 h.  He will also have Levaquin 500 mg daily for 14 days, as well as  albuterol MDI q.4 h.  Some Bactroban ointment.  The patient will have  prednisone taper.  The rest of the medications are as dictated by Dr.  Ladona Ridgel today.  He has a follow up scheduled with Dr. [name] in about  2 weeks.  Otherwise, the rest of the dictation is per Dr. Lubertha Basque  dictation.  The patient is ready for discharge home.      Lonia Blood, M.D.  Electronically Signed     LG/MEDQ  D:  04/20/2009  T:  04/20/2009  Job:  [id number]

## 2011-01-09 NOTE — Group Therapy Note (Signed)
[name], [name]            ACCOUNT NO.:  [id number]   MEDICAL RECORD NO.:  [id number]          PATIENT TYPE:  INP   LOCATION:  A303                          FACILITY:  [facility]   PHYSICIAN:  Melissa L. Ladona Ridgel, MD  DATE OF BIRTH:  Feb 19, 1946   DATE OF PROCEDURE:  04/17/2009  DATE OF DISCHARGE:                                 PROGRESS NOTE   The patient was again found lying in bed with a complaint of pain in his  arms and legs.  He is not writhing as much as he had with these before.  The patient is a bit dramatic with his presentation of pain if he is  observed.  When he is unaware, he is not nearly as dramatic with his  response to discomfort.  The patient states that he has not moved his  bowels and his uncomfortable  Other than that, he states he has a little  bit of nausea related to that but no vomiting.  He again complains of a  crampy type pain in his thighs and arms.  He states that the pain  medication is relieving it for a short period of time but basically  states that he is 57 minutes into his shot so he has been watching the  clock.   VITAL SIGNS:  Temperature is 98 temperature, T-max was 99.3, blood  pressure 111/74, pulse 87, respirations 20, saturation 98%.  GENERAL:  This is a moderately nourished African American male, lying in  bed rubbing his thighs and his arm.  He is otherwise normocephalic, atraumatic.  Pupils equal and reactive to  light.  He has a tendency to closing the right eye when he speaks to you  but when he opens it up, vision appears to be intact in both eyes.  Examination of the nose reveals septum midline without discharge.  Examination of the mouth reveals poor oral dentition.  No oral or lip  lesions.  NECK:  Supple.  There is no JVD.  No lymph nodes.  No carotid bruits.  CHEST:  Decreased but clear to auscultation.  There are no rhonchi,  rales or wheezes.  CARDIOVASCULAR:  Regular rate and rhythm.  Positive S1 and S2.  No S3,  S4.  No  murmurs, rubs or gallops.  ABDOMEN:  Firm, nondistended with  positive bowel sounds.  There is no guarding or rebound.  There is no  hepatosplenomegaly.  EXTREMITIES:  Show the right antecubital area is much less cellulitic  with no purulent material.  He has a left hand amputation.  The stump is  clean, dry and intact.  Lower extremities:  Right ankle is granulating  into the wound well.  There is no purulent material or discharge.  He  had 1+ pulses in the dorsalis pedis and posterior tibial area.   PERTINENT LABORATORIES:  Blood cultures showing gram-positive cocci in  clusters from the 21st.  His magnesium level is 2.2.  His CK is 13.  His  wound cultures are growing nothing from the right antecubital area.  His  CBC shows a white count of 30.4, hemoglobin 11.4,  hematocrit 32 and  platelets of 406.  Second set of blood cultures on the 21st is also  growing gram-positive cocci.  His sodium is 137, potassium 3.9, chloride  106, CO2 26, BUN 17, creatinine 0.68 and glucose of 176.  His total  bilirubin is 3.2, AST is 28, ALT 56, total protein 6.8, albumin 3.39,  calcium of 8.9.   ASSESSMENT/PLAN:  This is a 65 year old African American male who  presented from home after becoming short of breath while mowing the  lawn.  In the emergency room, he was found to be excessively hypoxic  without evidence for pneumonia.  He was found to be  anemic with  abnormal cells consistent with sickle cell disease.  Dr. [name] has  been following along with me to work up what appears to be a hemolytic  anemia.  He was actually started on prednisone on Friday.  His course  has been marked by further diagnosis of cardiomyopathy with Coreg and an  ACE inhibitor being started.  Dr. [name] has followed his pulmonary  status and feels that he has a mild amount of chronic obstructive  pulmonary disease which may need home O2 and Spiriva at discharge.  1. Hemolytic anemia.  Currently stable.  He is day #3 of  prednisone.      Will follow his LDH and his bilirubin.  2. Transaminitis.  Slightly improved although he remains with an      elevated bilirubin.  3. Mild cellulitis.  The right arm is improved today.  4. Bacteremia.  Please note that the 2 blood cultures drawn are      showing gram-positive cocci.  He has already been placed on      vancomycin as well as Levaquin to cover the cellulitis in the left      arm. We will continue to follow those cultures to determine the      source.  5. Abdominal pain with constipation.  Will obtain an acute abdominal      series to evaluate that and the patient can have another Fleet      enema. He has already been started on MiraLax and Colace.  6. Pain.  At this time it is difficult to separate out the true level      of his pain.  I do not wish to increase his Dilaudid at this time      but I will add Toradol and monitor his renal function.   Total time on this case is 30 minutes.      Melissa L. Ladona Ridgel, MD  Electronically Signed     MLT/MEDQ  D:  04/17/2009  T:  04/17/2009  Job:  [id number]
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
## 2011-01-09 NOTE — Group Therapy Note (Signed)
[name], WOOLLARD            ACCOUNT NO.:  [id number]   MEDICAL RECORD NO.:  [id number]          PATIENT TYPE:  INP   LOCATION:  A303                          FACILITY:  [facility]   PHYSICIAN:  Edward L. [name], M.D.DATE OF BIRTH:  Feb 02, 1946   DATE OF PROCEDURE:  04/14/2009  DATE OF DISCHARGE:                                 PROGRESS NOTE   The patient of the hospitalist team.   SUBJECTIVE:  Mr. Kocak is about the same.  He did have his pulmonary  function testing done yesterday and that shows only mild pulmonary  disease.   PHYSICAL EXAMINATION:  VITAL SIGNS:  Temperature is 97, pulse 88,  respirations 18, blood pressure 128/82, O2 sats 98%, and weight 95.8.   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  He  did have an alpha-1 antitrypsin, which is normal.  His pulmonary  function test is not terribly abnormal.  BMET was essentially normal  with an exception of blood sugar 105.  His white blood count is 21,200  and hemoglobin is 9.9.   ASSESSMENT:  He is hypoxic and I think is probably multifactorial and  includes problems with his chronic hemolytic anemia and some COPD.   PLAN:  He will need Spiriva when he is discharged.  He is still having  his other problems worked up at this point.      Edward L. [name], M.D.  Electronically Signed     ELH/MEDQ  D:  04/14/2009  T:  04/14/2009  Job:  [id number]

## 2011-05-03 ENCOUNTER — Emergency Department: Payer: Self-pay | Admitting: *Deleted

## 2011-05-09 ENCOUNTER — Encounter: Payer: Self-pay | Admitting: Nurse Practitioner

## 2011-05-09 ENCOUNTER — Encounter: Payer: Self-pay | Admitting: Cardiothoracic Surgery

## 2011-05-28 ENCOUNTER — Encounter: Payer: Self-pay | Admitting: Cardiothoracic Surgery

## 2011-05-28 ENCOUNTER — Encounter: Payer: Self-pay | Admitting: Nurse Practitioner

## 2011-06-28 ENCOUNTER — Encounter: Payer: Self-pay | Admitting: Cardiothoracic Surgery

## 2011-06-28 ENCOUNTER — Encounter: Payer: Self-pay | Admitting: Nurse Practitioner

## 2011-07-28 ENCOUNTER — Encounter: Payer: Self-pay | Admitting: Nurse Practitioner

## 2011-07-28 ENCOUNTER — Encounter: Payer: Self-pay | Admitting: Cardiothoracic Surgery

## 2011-08-28 ENCOUNTER — Encounter: Payer: Self-pay | Admitting: Nurse Practitioner

## 2011-08-29 ENCOUNTER — Inpatient Hospital Stay: Payer: Self-pay | Admitting: Internal Medicine

## 2011-08-29 LAB — COMPREHENSIVE METABOLIC PANEL
Alkaline Phosphatase: 79 U/L (ref 50–136)
Anion Gap: 13 (ref 7–16)
Bilirubin,Total: 11.4 mg/dL — ABNORMAL HIGH (ref 0.2–1.0)
Calcium, Total: 8.5 mg/dL (ref 8.5–10.1)
Chloride: 107 mmol/L (ref 98–107)
Co2: 22 mmol/L (ref 21–32)
Creatinine: 0.85 mg/dL (ref 0.60–1.30)
EGFR (Non-African Amer.): >60
Glucose: 136 mg/dL — ABNORMAL HIGH (ref 65–99)
Osmolality: 285 (ref 275–301)
Potassium: 3.8 mmol/L (ref 3.5–5.1)
Sodium: 142 mmol/L (ref 136–145)

## 2011-08-29 LAB — CBC
MCHC: 35.4 g/dL (ref 32.0–36.0)
Platelet: 317 10*3/uL (ref 150–440)
RDW: 24.3 % — ABNORMAL HIGH (ref 11.5–14.5)
WBC: 19 10*3/uL — ABNORMAL HIGH (ref 3.8–10.6)

## 2011-08-29 LAB — PRO B NATRIURETIC PEPTIDE: B-Type Natriuretic Peptide: 5299 pg/mL — ABNORMAL HIGH (ref 0–125)

## 2011-08-30 LAB — BASIC METABOLIC PANEL
Calcium, Total: 8.3 mg/dL — ABNORMAL LOW (ref 8.5–10.1)
Chloride: 108 mmol/L — ABNORMAL HIGH (ref 98–107)
Osmolality: 291 (ref 275–301)
Potassium: 3.4 mmol/L — ABNORMAL LOW (ref 3.5–5.1)
Sodium: 146 mmol/L — ABNORMAL HIGH (ref 136–145)

## 2011-08-31 ENCOUNTER — Encounter: Payer: Self-pay | Admitting: Cardiothoracic Surgery

## 2011-09-13 ENCOUNTER — Encounter: Payer: Self-pay | Admitting: Cardiovascular Disease

## 2011-09-28 ENCOUNTER — Encounter: Payer: Self-pay | Admitting: Nurse Practitioner

## 2011-09-28 ENCOUNTER — Encounter: Payer: Self-pay | Admitting: Cardiothoracic Surgery

## 2011-10-26 ENCOUNTER — Encounter: Payer: Self-pay | Admitting: Nurse Practitioner

## 2011-10-26 ENCOUNTER — Encounter: Payer: Self-pay | Admitting: Cardiothoracic Surgery

## 2011-11-26 ENCOUNTER — Encounter: Payer: Self-pay | Admitting: Nurse Practitioner

## 2011-11-26 ENCOUNTER — Encounter: Payer: Self-pay | Admitting: Cardiothoracic Surgery

## 2011-12-26 ENCOUNTER — Encounter: Payer: Self-pay | Admitting: Cardiothoracic Surgery

## 2011-12-26 ENCOUNTER — Encounter: Payer: Self-pay | Admitting: Nurse Practitioner

## 2012-01-26 ENCOUNTER — Encounter: Payer: Self-pay | Admitting: Nurse Practitioner

## 2012-01-26 ENCOUNTER — Encounter: Payer: Self-pay | Admitting: Cardiothoracic Surgery

## 2012-02-25 ENCOUNTER — Encounter: Payer: Self-pay | Admitting: Cardiothoracic Surgery

## 2012-02-25 ENCOUNTER — Encounter: Payer: Self-pay | Admitting: Nurse Practitioner

## 2012-03-27 ENCOUNTER — Encounter: Payer: Self-pay | Admitting: Nurse Practitioner

## 2012-03-27 ENCOUNTER — Encounter: Payer: Self-pay | Admitting: Cardiothoracic Surgery

## 2012-04-27 ENCOUNTER — Encounter: Payer: Self-pay | Admitting: Cardiothoracic Surgery

## 2012-04-27 ENCOUNTER — Encounter: Payer: Self-pay | Admitting: Nurse Practitioner

## 2014-02-01 ENCOUNTER — Inpatient Hospital Stay: Payer: Self-pay | Admitting: Internal Medicine

## 2014-02-01 LAB — COMPREHENSIVE METABOLIC PANEL
ALBUMIN: 3.4 g/dL (ref 3.4–5.0)
ALK PHOS: 75 U/L
ALT: 33 U/L (ref 12–78)
ANION GAP: 8 (ref 7–16)
BILIRUBIN TOTAL: 13.7 mg/dL — AB (ref 0.2–1.0)
BUN: 24 mg/dL — ABNORMAL HIGH (ref 7–18)
CREATININE: 0.88 mg/dL (ref 0.60–1.30)
Calcium, Total: 8.4 mg/dL — ABNORMAL LOW (ref 8.5–10.1)
Chloride: 105 mmol/L (ref 98–107)
Co2: 27 mmol/L (ref 21–32)
EGFR (African American): >60
Glucose: 127 mg/dL — ABNORMAL HIGH (ref 65–99)
OSMOLALITY: 285 (ref 275–301)
POTASSIUM: 3.4 mmol/L — AB (ref 3.5–5.1)
SGOT(AST): 37 U/L (ref 15–37)
SODIUM: 140 mmol/L (ref 136–145)
Total Protein: 7 g/dL (ref 6.4–8.2)

## 2014-02-01 LAB — PROTIME-INR
INR: 1.5
Prothrombin Time: 18.1 secs — ABNORMAL HIGH (ref 11.5–14.7)

## 2014-02-01 LAB — CBC
HCT: 30.5 % — AB (ref 40.0–52.0)
HGB: 10.2 g/dL — AB (ref 13.0–18.0)
MCH: 37.4 pg — AB (ref 26.0–34.0)
MCHC: 33.6 g/dL (ref 32.0–36.0)
MCV: 111 fL — ABNORMAL HIGH (ref 80–100)
Platelet: 366 10*3/uL (ref 150–440)
RBC: 2.74 10*6/uL — AB (ref 4.40–5.90)
RDW: 18.5 % — ABNORMAL HIGH (ref 11.5–14.5)
WBC: 25.8 10*3/uL — ABNORMAL HIGH (ref 3.8–10.6)

## 2014-02-01 LAB — TROPONIN I

## 2014-02-02 LAB — CBC WITH DIFFERENTIAL/PLATELET
Eosinophil: 2 %
HCT: 29.2 % — AB (ref 40.0–52.0)
HGB: 9.6 g/dL — ABNORMAL LOW (ref 13.0–18.0)
LYMPHS PCT: 13 %
MCH: 37.1 pg — ABNORMAL HIGH (ref 26.0–34.0)
MCHC: 32.7 g/dL (ref 32.0–36.0)
MCV: 113 fL — ABNORMAL HIGH (ref 80–100)
MYELOCYTE: 1 %
Metamyelocyte: 3 %
Monocytes: 11 %
NRBC/100 WBC: 2 /
Platelet: 311 10*3/uL (ref 150–440)
RBC: 2.58 10*6/uL — AB (ref 4.40–5.90)
RDW: 19.6 % — ABNORMAL HIGH (ref 11.5–14.5)
Segmented Neutrophils: 70 %
WBC: 26.7 10*3/uL — ABNORMAL HIGH (ref 3.8–10.6)

## 2014-02-02 LAB — BASIC METABOLIC PANEL
Anion Gap: 7 (ref 7–16)
BUN: 17 mg/dL (ref 7–18)
CALCIUM: 8.3 mg/dL — AB (ref 8.5–10.1)
CO2: 27 mmol/L (ref 21–32)
Chloride: 109 mmol/L — ABNORMAL HIGH (ref 98–107)
Creatinine: 0.97 mg/dL (ref 0.60–1.30)
EGFR (African American): >60
Glucose: 108 mg/dL — ABNORMAL HIGH (ref 65–99)
Osmolality: 287 (ref 275–301)
POTASSIUM: 3.4 mmol/L — AB (ref 3.5–5.1)
Sodium: 143 mmol/L (ref 136–145)

## 2014-02-02 LAB — MAGNESIUM: Magnesium: 2.1 mg/dL

## 2014-02-03 LAB — CBC WITH DIFFERENTIAL/PLATELET
BANDS NEUTROPHIL: 1 %
EOS PCT: 2 %
HCT: 29.2 % — AB (ref 40.0–52.0)
HGB: 9.8 g/dL — ABNORMAL LOW (ref 13.0–18.0)
LYMPHS PCT: 16 %
MCH: 37.9 pg — AB (ref 26.0–34.0)
MCHC: 33.6 g/dL (ref 32.0–36.0)
MCV: 113 fL — AB (ref 80–100)
MONOS PCT: 10 %
Metamyelocyte: 1 %
Myelocyte: 1 %
NRBC/100 WBC: 4 /
Platelet: 298 10*3/uL (ref 150–440)
RBC: 2.59 10*6/uL — AB (ref 4.40–5.90)
RDW: 21.5 % — AB (ref 11.5–14.5)
Segmented Neutrophils: 69 %
WBC: 24.5 10*3/uL — ABNORMAL HIGH (ref 3.8–10.6)

## 2014-02-03 LAB — VANCOMYCIN, TROUGH: Vancomycin, Trough: 12 ug/mL (ref 10–20)

## 2014-02-06 LAB — CULTURE, BLOOD (SINGLE)

## 2014-03-04 ENCOUNTER — Inpatient Hospital Stay: Payer: Self-pay | Admitting: Internal Medicine

## 2014-03-04 LAB — BASIC METABOLIC PANEL
ANION GAP: 11 (ref 7–16)
BUN: 23 mg/dL — ABNORMAL HIGH (ref 7–18)
CALCIUM: 8.1 mg/dL — AB (ref 8.5–10.1)
CREATININE: 1.19 mg/dL (ref 0.60–1.30)
Chloride: 108 mmol/L — ABNORMAL HIGH (ref 98–107)
Co2: 21 mmol/L (ref 21–32)
EGFR (African American): >60
EGFR (Non-African Amer.): >60
GLUCOSE: 131 mg/dL — AB (ref 65–99)
OSMOLALITY: 285 (ref 275–301)
Potassium: 3.4 mmol/L — ABNORMAL LOW (ref 3.5–5.1)
Sodium: 140 mmol/L (ref 136–145)

## 2014-03-04 LAB — URINALYSIS, COMPLETE
BACTERIA: NONE SEEN
Bilirubin,UR: NEGATIVE
Glucose,UR: NEGATIVE mg/dL (ref 0–75)
Ketone: NEGATIVE
Leukocyte Esterase: NEGATIVE
NITRITE: NEGATIVE
Ph: 5 (ref 4.5–8.0)
Protein: 30
SQUAMOUS EPITHELIAL: NONE SEEN
Specific Gravity: 1.041 (ref 1.003–1.030)
WBC UR: 6 /HPF (ref 0–5)

## 2014-03-04 LAB — PRO B NATRIURETIC PEPTIDE: B-TYPE NATIURETIC PEPTID: 9652 pg/mL — AB (ref 0–125)

## 2014-03-04 LAB — CBC
HCT: 32.7 % — AB (ref 40.0–52.0)
HGB: 11.4 g/dL — ABNORMAL LOW (ref 13.0–18.0)
MCH: 39.4 pg — ABNORMAL HIGH (ref 26.0–34.0)
MCHC: 34.8 g/dL (ref 32.0–36.0)
MCV: 113 fL — ABNORMAL HIGH (ref 80–100)
Platelet: 344 10*3/uL (ref 150–440)
RBC: 2.88 10*6/uL — AB (ref 4.40–5.90)
RDW: 20.9 % — ABNORMAL HIGH (ref 11.5–14.5)
WBC: 30.5 10*3/uL — AB (ref 3.8–10.6)

## 2014-03-04 LAB — TROPONIN I: Troponin-I: <0.02 ng/mL

## 2014-03-05 LAB — BASIC METABOLIC PANEL
Anion Gap: 11 (ref 7–16)
BUN: 24 mg/dL — ABNORMAL HIGH (ref 7–18)
CALCIUM: 8 mg/dL — AB (ref 8.5–10.1)
CO2: 21 mmol/L (ref 21–32)
Chloride: 107 mmol/L (ref 98–107)
Creatinine: 0.99 mg/dL (ref 0.60–1.30)
EGFR (African American): >60
EGFR (Non-African Amer.): >60
GLUCOSE: 107 mg/dL — AB (ref 65–99)
Osmolality: 282 (ref 275–301)
Potassium: 3.7 mmol/L (ref 3.5–5.1)
SODIUM: 139 mmol/L (ref 136–145)

## 2014-03-05 LAB — CBC WITH DIFFERENTIAL/PLATELET
EOS PCT: 1 %
HCT: 29.3 % — AB (ref 40.0–52.0)
HGB: 9.7 g/dL — ABNORMAL LOW (ref 13.0–18.0)
Lymphocytes: 16 %
MCH: 37 pg — AB (ref 26.0–34.0)
MCHC: 33.3 g/dL (ref 32.0–36.0)
MCV: 111 fL — AB (ref 80–100)
MONOS PCT: 7 %
NRBC/100 WBC: 3 /
PLATELETS: 308 10*3/uL (ref 150–440)
RBC: 2.63 10*6/uL — ABNORMAL LOW (ref 4.40–5.90)
RDW: 21.7 % — ABNORMAL HIGH (ref 11.5–14.5)
SEGMENTED NEUTROPHILS: 76 %

## 2014-03-05 LAB — MAGNESIUM: Magnesium: 2.1 mg/dL

## 2014-03-09 LAB — CULTURE, BLOOD (SINGLE)

## 2014-04-13 ENCOUNTER — Inpatient Hospital Stay: Payer: Self-pay | Admitting: Internal Medicine

## 2014-04-13 LAB — URINALYSIS, COMPLETE
Bilirubin,UR: NEGATIVE
Blood: NEGATIVE
GLUCOSE, UR: NEGATIVE mg/dL (ref 0–75)
Ketone: NEGATIVE
Leukocyte Esterase: NEGATIVE
Nitrite: NEGATIVE
Ph: 5 (ref 4.5–8.0)
RBC,UR: 6 /HPF (ref 0–5)
Specific Gravity: 1.013 (ref 1.003–1.030)
Squamous Epithelial: 2

## 2014-04-13 LAB — COMPREHENSIVE METABOLIC PANEL
ALBUMIN: 3.4 g/dL (ref 3.4–5.0)
ALK PHOS: 86 U/L
ALT: 172 U/L — AB
AST: 247 U/L — AB (ref 15–37)
Anion Gap: 14 (ref 7–16)
BUN: 24 mg/dL — AB (ref 7–18)
Bilirubin,Total: 15.3 mg/dL — ABNORMAL HIGH (ref 0.2–1.0)
CREATININE: 1.3 mg/dL (ref 0.60–1.30)
Calcium, Total: 8.2 mg/dL — ABNORMAL LOW (ref 8.5–10.1)
Chloride: 104 mmol/L (ref 98–107)
Co2: 21 mmol/L (ref 21–32)
EGFR (African American): >60
EGFR (Non-African Amer.): 57 — ABNORMAL LOW
Glucose: 124 mg/dL — ABNORMAL HIGH (ref 65–99)
Osmolality: 283 (ref 275–301)
Potassium: 4.9 mmol/L (ref 3.5–5.1)
Sodium: 139 mmol/L (ref 136–145)
TOTAL PROTEIN: 7.3 g/dL (ref 6.4–8.2)

## 2014-04-13 LAB — CK TOTAL AND CKMB (NOT AT ARMC)
CK, Total: 87 U/L
CK-MB: 1 ng/mL (ref 0.5–3.6)

## 2014-04-13 LAB — PROTIME-INR
INR: 1.5
Prothrombin Time: 17.5 secs — ABNORMAL HIGH (ref 11.5–14.7)

## 2014-04-13 LAB — CBC
HCT: 34.5 % — AB (ref 40.0–52.0)
HGB: 11.5 g/dL — ABNORMAL LOW (ref 13.0–18.0)
MCH: 37.8 pg — ABNORMAL HIGH (ref 26.0–34.0)
MCHC: 33.4 g/dL (ref 32.0–36.0)
MCV: 113 fL — ABNORMAL HIGH (ref 80–100)
PLATELETS: 334 10*3/uL (ref 150–440)
RBC: 3.05 10*6/uL — AB (ref 4.40–5.90)
RDW: 21.8 % — ABNORMAL HIGH (ref 11.5–14.5)
WBC: 40 10*3/uL — AB (ref 3.8–10.6)

## 2014-04-13 LAB — TROPONIN I: TROPONIN-I: 0.06 ng/mL — AB

## 2014-04-13 LAB — PRO B NATRIURETIC PEPTIDE: B-Type Natriuretic Peptide: 16185 pg/mL — ABNORMAL HIGH (ref 0–125)

## 2014-04-14 LAB — CBC WITH DIFFERENTIAL/PLATELET
HCT: 32.5 % — AB (ref 40.0–52.0)
HGB: 10.6 g/dL — ABNORMAL LOW (ref 13.0–18.0)
LYMPHS PCT: 9 %
MCH: 37.3 pg — ABNORMAL HIGH (ref 26.0–34.0)
MCHC: 32.7 g/dL (ref 32.0–36.0)
MCV: 114 fL — ABNORMAL HIGH (ref 80–100)
Monocytes: 8 %
NRBC/100 WBC: 1 /
Platelet: 315 10*3/uL (ref 150–440)
RBC: 2.85 10*6/uL — ABNORMAL LOW (ref 4.40–5.90)
RDW: 22.4 % — AB (ref 11.5–14.5)
Segmented Neutrophils: 83 %
WBC: 31.3 10*3/uL — ABNORMAL HIGH (ref 3.8–10.6)

## 2014-04-14 LAB — BASIC METABOLIC PANEL
Anion Gap: 17 — ABNORMAL HIGH (ref 7–16)
BUN: 40 mg/dL — ABNORMAL HIGH (ref 7–18)
CHLORIDE: 105 mmol/L (ref 98–107)
Calcium, Total: 7.9 mg/dL — ABNORMAL LOW (ref 8.5–10.1)
Co2: 18 mmol/L — ABNORMAL LOW (ref 21–32)
Creatinine: 1.81 mg/dL — ABNORMAL HIGH (ref 0.60–1.30)
EGFR (African American): 44 — ABNORMAL LOW
EGFR (Non-African Amer.): 38 — ABNORMAL LOW
GLUCOSE: 164 mg/dL — AB (ref 65–99)
Osmolality: 293 (ref 275–301)
Potassium: 4.6 mmol/L (ref 3.5–5.1)
Sodium: 140 mmol/L (ref 136–145)

## 2014-04-14 LAB — LIPID PANEL
Cholesterol: 67 mg/dL (ref 0–200)
HDL Cholesterol: 25 mg/dL — ABNORMAL LOW (ref 40–60)
Ldl Cholesterol, Calc: 28 mg/dL (ref 0–100)
Triglycerides: 69 mg/dL (ref 0–200)
VLDL CHOLESTEROL, CALC: 14 mg/dL (ref 5–40)

## 2014-04-14 LAB — TSH: THYROID STIMULATING HORM: 0.23 u[IU]/mL — AB

## 2014-04-15 LAB — T4, FREE: FREE THYROXINE: 1.33 ng/dL (ref 0.76–1.46)

## 2014-04-18 LAB — CULTURE, BLOOD (SINGLE)

## 2014-04-20 ENCOUNTER — Ambulatory Visit: Payer: Self-pay | Admitting: Internal Medicine

## 2014-04-20 LAB — CBC CANCER CENTER
Bands: 1 %
EOS PCT: 3 %
HCT: 37.3 % — ABNORMAL LOW (ref 40.0–52.0)
HGB: 12.5 g/dL — AB (ref 13.0–18.0)
Lymphocytes: 12 %
MCH: 35.9 pg — ABNORMAL HIGH (ref 26.0–34.0)
MCHC: 33.6 g/dL (ref 32.0–36.0)
MCV: 107 fL — ABNORMAL HIGH (ref 80–100)
METAMYELOCYTE: 1 %
MYELOCYTE: 1 %
Monocytes: 9 %
NRBC/100 WBC: 1 /100
PLATELETS: 245 x10 3/mm (ref 150–440)
RBC: 3.5 10*6/uL — ABNORMAL LOW (ref 4.40–5.90)
RDW: 19.6 % — ABNORMAL HIGH (ref 11.5–14.5)
Segmented Neutrophils: 72 %
Variant Lymphocyte: 1 %
WBC: 18.2 x10 3/mm — ABNORMAL HIGH (ref 3.8–10.6)

## 2014-04-20 LAB — RETICULOCYTES
Absolute Retic Count: 0.2295 10*6/uL — ABNORMAL HIGH (ref 0.019–0.186)
RETICULOCYTE: 6.56 % — AB (ref 0.4–3.1)

## 2014-04-20 LAB — IRON AND TIBC
IRON SATURATION: 36 %
Iron Bind.Cap.(Total): 382 ug/dL (ref 250–450)
Iron: 137 ug/dL (ref 65–175)
Unbound Iron-Bind.Cap.: 245 ug/dL

## 2014-04-20 LAB — LACTATE DEHYDROGENASE: LDH: 373 U/L — ABNORMAL HIGH (ref 85–241)

## 2014-04-20 LAB — FOLATE: FOLIC ACID: 42.2 ng/mL (ref 3.1–100.0)

## 2014-04-20 LAB — FERRITIN: Ferritin (ARMC): 135 ng/mL (ref 8–388)

## 2014-04-22 LAB — PROT IMMUNOELECTROPHORES(ARMC)

## 2014-04-27 ENCOUNTER — Ambulatory Visit: Payer: Self-pay | Admitting: Internal Medicine

## 2014-04-27 LAB — OCCULT BLOOD X 1 CARD TO LAB, STOOL
Occult Blood, Feces: NEGATIVE
Occult Blood, Feces: NEGATIVE

## 2014-04-30 LAB — URINE IEP, RANDOM

## 2014-05-27 ENCOUNTER — Ambulatory Visit: Payer: Self-pay | Admitting: Internal Medicine

## 2014-06-11 ENCOUNTER — Ambulatory Visit: Payer: Self-pay | Admitting: Specialist

## 2014-07-21 ENCOUNTER — Emergency Department: Payer: Self-pay | Admitting: Emergency Medicine

## 2014-07-21 LAB — CBC
HCT: 34 % — ABNORMAL LOW (ref 40.0–52.0)
HGB: 11.6 g/dL — ABNORMAL LOW (ref 13.0–18.0)
MCH: 38.1 pg — ABNORMAL HIGH (ref 26.0–34.0)
MCHC: 34 g/dL (ref 32.0–36.0)
MCV: 112 fL — ABNORMAL HIGH (ref 80–100)
Platelet: 277 10*3/uL (ref 150–440)
RBC: 3.03 10*6/uL — AB (ref 4.40–5.90)
RDW: 22.2 % — AB (ref 11.5–14.5)
WBC: 22.5 10*3/uL — ABNORMAL HIGH (ref 3.8–10.6)

## 2014-07-21 LAB — BASIC METABOLIC PANEL
ANION GAP: 11 (ref 7–16)
BUN: 24 mg/dL — ABNORMAL HIGH (ref 7–18)
Calcium, Total: 8.1 mg/dL — ABNORMAL LOW (ref 8.5–10.1)
Chloride: 101 mmol/L (ref 98–107)
Co2: 25 mmol/L (ref 21–32)
Creatinine: 1.03 mg/dL (ref 0.60–1.30)
EGFR (African American): >60
EGFR (Non-African Amer.): >60
Glucose: 108 mg/dL — ABNORMAL HIGH (ref 65–99)
OSMOLALITY: 278 (ref 275–301)
Potassium: 4.3 mmol/L (ref 3.5–5.1)
SODIUM: 137 mmol/L (ref 136–145)

## 2014-07-21 LAB — TROPONIN I: Troponin-I: <0.02 ng/mL

## 2014-07-21 LAB — PROTIME-INR
INR: 2
PROTHROMBIN TIME: 22 s — AB (ref 11.5–14.7)

## 2014-10-21 ENCOUNTER — Emergency Department: Payer: Self-pay | Admitting: Emergency Medicine

## 2014-12-18 NOTE — H&P (Signed)
PATIENT NAME:  [name], [name] MR#:  [id number] DATE OF BIRTH:  05-Aug-1946  DATE OF ADMISSION:  03/04/2014  REFERRING PHYSICIAN: Kathreen Devoid. Paduchowski, MD  PRIMARY CARE PHYSICIAN: [employer].   CHIEF COMPLAINT: Insect bite and hypoxia.   HISTORY OF PRESENT ILLNESS: This is a 69 year old male with known history of DVT, PE in the past, noncompliant with his Eliquis secondary to financial issues. As well, history of tobacco abuse in the past, sickle cell trait. The patient was recently hospitalized as of 02/01/2014 due to shortness of breath, where he was found to have atypical pneumonia with leukocytosis, where he was treated with IV antibiotic, discharged on p.o. levofloxacin at home. The patient presents with multiple complaints. Mainly today, he reports he was working in [place], where he was bit by insect on the back, where he felt back pain and headache and then he felt shortness of breath, which prompted him to come to the ED. The patient had no stridor when he came to the ED. As well, he was found to be hypoxic, ABG showing pO2 of 55. The patient reports he has not been taking any anticoagulation since his most recent discharge as he could not afford Eliquis upon discharge. Reports he only took 1 pill of Pradaxa yesterday when he had insect bite and the shortness of breath as he had some samples left. The patient had CT chest with IV contrast which did not show any evidence of PE. The patient's blood work was significant for leukocytosis. The patient denies any fever, any chills, any cough, any productive sputum, any dysuria or polyuria. By reviewing his old blood work, the patient appears to be having persistent leukocytosis over the last 3 years with gradual progression and elevation in number. Given the patient's hypoxia, hospitalist requested to admit the patient for further evaluation. He denies any chest pain, any hemoptysis, any leg swelling or edema.   PAST MEDICAL HISTORY:  1.  DVT.  2. Tobacco abuse in the past, no longer smoking.  3. Sickle cell trait.  4. Renal cyst.  5. 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  6. Anemia, hemolytic.  7. Cholelithiasis.   8. Pulmonary embolism.   PAST SURGICAL HISTORY:  1. Bilateral cataract surgery.  2. Splenectomy at age 13 months.  3. Partial left forearm amputation.  4. Cholecystectomy.  5. IVC filter placement in 2012.  6. Multiple skin grafts, right foot and ankle.   HOME MEDICATIONS:  1. As mentioned, the patient used to take Pradaxa, where he was switched to Eliquis during his previous hospitalization, but he was not taking any anticoagulation at home.  2. Vitamin B12 100 mcg oral daily.  3. Multivitamin 1 tablet daily.  4. Folic acid 1 mg daily.   ALLERGIES: No known drug allergies.   FAMILY HISTORY: Denies any history of diabetes or coronary artery disease.   SOCIAL HISTORY: Previous history of tobacco abuse. Currently, no alcohol, no tobacco, no illicit drug use.   REVIEW OF SYSTEMS: CONSTITUTIONAL: The patient denies fever, chills, fatigue, weakness, weight gain, weight loss.  EYES: Denies blurry vision, double vision, inflammation, glaucoma or cataracts.  ENT: Denies tinnitus, ear pain, hearing loss or dysphagia.  RESPIRATORY: Denies any cough, any productive sputum. Complains of shortness of breath at baseline.  CARDIOVASCULAR: Denies any chest pain, any orthopnea, edema, arrhythmia.  GASTROINTESTINAL: Denies nausea, vomiting, diarrhea, abdominal pain.  GENITOURINARY: Denies dysuria, hematuria or renal colic.  ENDOCRINE: Denies polyuria, polydipsia, heat or cold intolerance.  HEMATOLOGY: Denies anemia. Reports  history of sickle cell trait.  SKIN: Denies any acne, rash. Has chronic lateral right ankle ulcer.  MUSCULOSKELETAL: Reports back pain and headache and neck pain after the insect bite. NEUROLOGIC: Denies any tingling, numbness, focal deficits, CVA or TIA. PSYCHIATRIC: Denies anxiety, insomnia or depression.    PHYSICAL EXAMINATION:  VITAL SIGNS: Temperature 98.3, pulse 95, respiratory rate 18, blood pressure 116/76, saturating 94% on 3 liters nasal cannula.  GENERAL: Well-nourished male who looks comfortable in bed, in no apparent distress.  HEENT: Head atraumatic, normocephalic. Pupils equally reactive to light. Pink conjunctivae. Anicteric sclerae. Moist oral mucosa.  NECK: Supple. No thyromegaly. Has plus 6 cm JVD.  CHEST: Good air entry bilaterally. No wheezing, rales, rhonchi.  CARDIOVASCULAR: S1, S2 heard. No rubs, murmurs or gallops.  ABDOMEN: Soft, nontender, nondistended. Bowel sounds present.  EXTREMITIES: No edema. No clubbing. No cyanosis. Pedal pulses slightly diminished, but felt. The patient has lateral right ankle skin ulcer with granulation tissue forming at the base. No discharge. The patient has left upper extremity hand amputation.  PSYCHIATRIC: Appropriate affect. Awake, alert x3. Intact judgment and insight.  NEUROLOGIC: Cranial nerves grossly intact. Motor 5 out of 5. No focal deficits.  MUSCULOSKELETAL: No joint effusion or erythema.   PERTINENT LABORATORY DATA: Glucose 131. BNP 9652. BUN 23, creatinine 1.19, sodium 140, potassium 3.4, chloride 108, CO2 21. Troponin less than 0.02. White blood cells 30,000, hemoglobin 11.4, hematocrit 32.7, platelet 344. The pH 7.4, pCO2 of 32, pO2 of 55.   IMAGING:  CT head without contrast showing no acute intracranial process, mild chronic microvascular ischemic changes.  CT angiograph chest for PE: No evidence of significant pulmonary embolism. Stable appearance of the chest since prior study.   ASSESSMENT AND PLAN:  1. Acute respiratory failure with hypoxic type. The patient will be kept on p.r.n. oxygen. Will consult social services/home care to arrange for oxygen on discharge. Etiology is unclear as the patient does not appear to be having any active pulmonary embolism or infectious process on his CT chest. Possibility of pulmonary  embolism secondary to his PE is there. The patient has an appointment with pulmonary as an outpatient, Dr. [name] on the 13th of this month, which he is asked to keep. As well, versus congestive heart failure as he is having elevated BNP, mild JVD, but no evidence of volume overload on CT chest or clinically. Will check 2-D echocardiogram as well.  2. Leukocytosis: As well, this is chronic. It has been progressing. Unclear if this is due to infectious process or not. Will check procalcitonin. Will obtain septic workup, including urinalysis and blood cultures. Will consult oncology.  3. History of pulmonary embolism and deep vein thrombosis. Due to financial reasons, the patient could not afford Eliquis. He can be resumed back on Pradaxa.  4. Deep vein thrombosis prophylaxis. The patient will be on full anticoagulation with Pradaxa. As well, he has IVC filter.   TOTAL TIME SPENT ON ADMISSION AND PATIENT CARE: 55 minutes.    ____________________________ Starleen Armsawood S. Sabre Romberger, MD dse:lb D: 03/04/2014 07:53:06 ET T: 03/04/2014 08:17:34 ET JOB#: [id number]  cc: Starleen Armsawood S. Brenan Modesto, MD, <Dictator> Fedora Knisely Teena IraniS Varnika Butz MD ELECTRONICALLY SIGNED 03/05/2014 0:52

## 2014-12-18 NOTE — Consult Note (Signed)
Brief Consult Note: Diagnosis: Leucocytosis.   Comments: Patient with sickle cell disease with hypoxia with leukocytosis with increased neutrophils. May be secondary to infection or steroids. Await flow cytometry and will follow [facility] over next fe days.  Electronic Signatures: Antony Hasteamiah, Trixie Maclaren S (MD)  (Signed 09-Jul-15 16:59)  Authored: Brief Consult Note   Last Updated: 09-Jul-15 16:59 by Antony Hasteamiah, Trayden Brandy S (MD)

## 2014-12-18 NOTE — H&P (Signed)
PATIENT NAME:  [name], [name] MR#:  [id number] DATE OF BIRTH:  01-Nov-1945  DATE OF ADMISSION:  04/13/2014  [facility]   EMERGENCY DEPARTMENT REFERRING PHYSICIAN:  Acquanetta Bellingebecca L Lord, MD   CHIEF COMPLAINT: Shortness of breath.   HISTORY OF PRESENT ILLNESS: The patient is a 69 year old African American male who is known to my service from his last hospitalization when I saw him on 03/05/2014; at that time, he had presented with shortness of breath, and was thought to have a COPD exacerbation. He had an echocardiogram of the heart which showed likely moderate pulmonary hypertension. He also had a CT per PE protocol which was negative for PE, who was discharged home on O2.  Presents complaining of shortness of breath which he relates to taking some anti-inflammatory over the counter medication started a few days ago; it has progressively gotten worse. The patient had significantly diminished air entry in the ED on arrival, and was tachypneic, and tachycardic; therefore, he received breathing treatments, still short of breath; therefore, we are asked to admit the patient. The patient otherwise denies any fevers, chills. No chest pains, palpitations. No nausea, vomiting, or diarrhea. No abdominal pain; denies any lower extremity swelling.   PAST MEDICAL HISTORY: History of DVT in the past, history of tobacco abuse in the past, no longer smoking; sickle cell trait, renal cyst, history of adrenal mass on the left, history of any hemolytic anemia, history of chronic leukocytosis, during last admission he was seen by hematology was supposed to follow up; however, does not seem like he has followed up with hematology. Patient also had a flow cytometry which did not show any abnormalities to suggest chronic leukemia.   PAST SURGICAL HISTORY:   1.  Bilateral cataract surgery.  2.  Splenectomy at age 67 months.  3.  Partial left forearm amputation.  4.  Cholecystectomy.  5.   Status post IVC filter placement in 2012,  6.  Multiple skin grafts right foot and ankle.   CURRENT MEDICATIONS: He is on Striverdi Respimat 2.5 inhalations 2 puffs daily, multivitamin daily, folic acid 1 mg daily, fish oil 1 tab p.o. daily, Advair 250/50 one puff b.i.d.   ALLERGIES: None.   FAMILY HISTORY: History of hypertension.   SOCIAL HISTORY: Previous history of tobacco abuse, none now; no alcohol or drug use.   REVIEW OF SYSTEMS:  CONSTITUTIONAL: Denies any fevers; complains of weakness. No weight loss. No weight gain.   EYES: No blurred or double vision. No redness. No inflammation.  ENT: No tinnitus. No ear pain. No hearing loss; no seasonal, or year-round allergies. No epistaxis. No difficulty swallowing.  RESPIRATORY: Complains of shortness of breath, has a history of COPD, he has pulmonary hypertension.  CARDIOVASCULAR: Denies any chest pain, orthopnea, edema, or arrhythmia.  GASTROINTESTINAL: No nausea, vomiting, diarrhea. No abdominal pain; no hematemesis emesis, or melena.  GENITOURINARY: Denies any dysuria, hematuria, renal calculus, or frequency.  ENDOCRINE: Denies any polyuria, nocturia.  HEMATOLOGIC AND LYMPHATIC: Has a history of hemolytic anemia in the past, history of chronic leukocytosis.  SKIN: No acne. No rash.  MUSCULOSKELETAL: Denies any pain in the neck, back, or shoulder.  NEUROLOGIC: No numbness, CVA, TIA.  PSYCHIATRIC: No anxiety, insomnia, or ADD.   PHYSICAL EXAMINATION: VITAL SIGNS: Temperature 98.6, pulse 117, respirations 16, blood pressure 111/81, O2 sat of 85% on room air.  GENERAL: The patient is a well-developed male in mild respiratory distress with accessory muscle usage.  HEENT: Head atraumatic, normocephalic.  Pupils equally round, reactive to light, and accommodation. There is no conjunctival pallor. No scleral icterus. Nasal exam shows no drainage or ulceration. Ear exam shows no erythema or drainage.  NECK: No thyromegaly. No carotid bruits,  full range of motion intact.  RESPIRATORY: He has diminished breath sounds bilaterally with accessory muscle usage, there are no rhonchi.  CARDIOVASCULAR: S1, S2, positive, tachycardic, no murmur, rubs, clicks, or gallops.  ABDOMEN: Soft, nontender, nondistended, positive bowel sounds x 4, no hepatosplenomegaly.  EXTREMITIES: No clubbing, cyanosis, or edema.  SKIN: No rash.  LYMPHATICS: No lymph nodes palpable.  VASCULAR: Good DP, PT pulses.  PSYCHIATRIC: Not anxious, or depressed.  NEUROLOGICAL: Awake, alert, oriented x 3, no focal deficits.  MUSCULOSKELETAL: There is no erythema, or swelling.   LABORATORY: CPK 87, CK-MB 1.0, glucose 124, BUN 24, creatinine 1.30, sodium 139, potassium 4.9, chloride 104, CO2 is 21, alkaline phosphatase is 86, ALT 172, AST 247,000. Chest x-ray shows heart size upper limit of normal.   ASSESSMENT AND PLAN: The patient is a 69 year old African American male with history of chronic obstructive pulmonary disease, recent hospitalization in June for same, comes in shortness of breath, cough.  1.  Acute respiratory failure, likely due to chronic obstructive pulmonary disease flare. At this time, we will treat him with nebulizers, steroids, and empiric antibiotics; continue Advair as taking at home.  2.  Elevated WBC, chronic.  We will need outpatient hematological follow-up.  3.  History of sickle cell trait. Follow his hemoglobin.  4.  Miscellaneous. The patient will be on Lovenox for deep vein thrombosis prophylaxis.   TIME SPENT ON THIS PATIENT: Is 50 minutes.    ____________________________ Lacie Scotts Allena Katz, MD shp:nt D: 04/13/2014 13:49:53 ET T: 04/13/2014 14:01:52 ET JOB#: [id number]  cc: Eudora Guevarra H. Allena Katz, MD, <Dictator> Charise Carwin MD ELECTRONICALLY SIGNED 04/18/2014 13:59

## 2014-12-18 NOTE — Discharge Summary (Signed)
PATIENT NAME:  [name], [name] MR#:  [id number] DATE OF BIRTH:  07/17/1946  DATE OF ADMISSION:  04/13/2014 DATE OF DISCHARGE:  04/16/2014  DISCHARGE DIAGNOSES: 1.  Acute hypoxic respiratory failure due to chronic obstructive pulmonary disease exacerbation, 2.  Chronic obstructive pulmonary disease exacerbation, improving.   SECONDARY DIAGNOSES: 1.  History of deep vein thrombosis.  2.  Sickle cell trait.  3.  History of adrenal mass. 4.  History of hemolytic anemia. 5.  Chronic leukocytosis.   CONSULTATIONS: Pulmonary, Dr. Freda MunroSaadat Khan.   PROCEDURES AND RADIOLOGY: Chest x-ray on August 18, showed no acute cardiopulmonary disease.   Chest x-ray on August 21, showed no evidence of acute cardiopulmonary disease.   MAJOR LABORATORY PANEL: Urinalysis on admission showed 1+ bacteria, 11 WBCs, otherwise negative.   Blood cultures x2 were negative on admission.   Serum free T3 was 1.9.   HISTORY AND SHORT HOSPITAL COURSE: The patient is a 69 year old male with above-mentioned medical problems who was admitted for shortness of breath thought to be from exacerbation. Please see Dr. Serita GritShreyang Patel's dictated history and physical for further details. The patient was improving with regimen of steroids and nebulizer breathing treatments.  Pulmonary consultation was obtained with Dr. Freda MunroSaadat Khan who agreed with the same and the patient was feeling a whole lot better by August 21, and was discharged home as he was close to his baseline.   PHYSICAL EXAMINATION: VITAL SIGNS:  On the date of discharge, his vital signs are as follows: Temperature 98, heart rate 84 per minute, respirations 20 per minute, blood pressure 135/80 mmHg. He was saturating 90% on 4 liters oxygen via nasal cannula. He desaturated to 86% on 2 liters oxygen via nasal cannula, so he was requested to continue at least 3-4 liters oxygen at home. The patient was very adamant on wanting to go home in spite request to stay here just to get  him weaned down on oxygen and get his aeration improved. The patient was adamant for discharge.   LUNGS: Clear to auscultation bilaterally. No wheezing, rales rhonchi, or crepitation.  ABDOMEN: Soft, benign.  NEUROLOGIC: Nonfocal examination.  All other physical examination remained at baseline.   DISCHARGE MEDICATIONS: 1.  Folic acid  1 mg p.o. daily.  2.  Multivitamin once daily.  3.  Fish oil once daily.  4.  Striverdi Respimat 2 puffs inhaled once a day. 5.  Advair 250/50 one puff b.i.d.  6.  Levaquin 500 mg p.o. daily for 5 days. 7.  Prednisone 60 mg p.o. daily, taper that 10 mg daily until finished.  DISCHARGE DIET: Low sodium.   DISCHARGE ACTIVITY: As tolerated.   DISCHARGE INSTRUCTIONS AND FOLLOWUP:  The patient was instructed to [facility] up [facility] in 1 week.  He will need follow up with pulmonary, Dr. Ned ClinesHerbon Fleming in 1-2 weeks. He was instructed to continue 3-4 liters oxygen via nasal cannula continuous at home. The patient certainly will need close monitoring of his kidney function and evaluation of his thyroid function, as he did have a low TSH along with low free T3 suggestive of central cord or thyroid abnormality. He remains at high risk for readmission.   TOTAL TIME DISCHARGING THIS PATIENT: 55 minutes.    ____________________________ [name], MD vss:DT D: 04/16/2014 16:17:01 ET T: 04/16/2014 18:59:49 ET JOB#: [id number]  cc: Otho Michalik S. Sherryll BurgerShah, MD, <Dictator> Herbon E. Meredeth IdeFleming, MD Ellamae SiaVIPUL S Deer[facility] Head CenterHAH MD ELECTRONICALLY SIGNED 04/19/2014 22:43

## 2014-12-18 NOTE — Discharge Summary (Signed)
PATIENT NAME:  [name], [name] MR#:  [id number] DATE OF BIRTH:  02/11/1946  DATE OF ADMISSION:  02/01/2014 DATE OF DISCHARGE:  02/04/2014  PRIMARY CARE PHYSICIAN:  Dr. Ulanda EdisonEmma Williams.  DISCHARGE DIAGNOSES: 1.  Atypical pneumonia with leukocytosis.  2.  Right foot ulcer infection.  3.  History of pulmonary embolism.  4.  Anemia.   CONDITION: Stable.   CODE STATUS: FULL CODE.   HOME MEDICATIONS:  Please refer to the medication reconciliation list. The patient will be given Levaquin 500 mg p.o. daily for 7 days. In addition, Dr. Meredeth IdeFleming suggested Eliquis instead of Pradaxa. We will give Eliquis 2.5 mg p.o. b.i.d.   ACTIVITY:  As tolerated.   DIET:  Regular diet.   FOLLOWUP CARE: Follow up with PCP within 1 to 2 weeks. Follow up with the patient's pulmonary physician  within 1 to 2 weeks. Also, the patient needs  followup CBC as an outpatient.   REASON FOR ADMISSION:  Shortness of breath.   HOSPITAL COURSE:  The patient is a 69 year old African American male with history of PE, DVT, sickle cell trait, presented to the ED with shortness of breath. The patient's white count was 25.8. CAT scan PE protocol did not show any PE. The patient had a similar leukocytosis before. For detailed history and physical examination, please refer to the admission note dictated by Dr. Auburn BilberryShreyang Patel.  1.  Atypical pneumonia with leukocytosis. After admission, the patient had been treated with Levaquin. The patient's blood culture is negative; however, the patient does still have leukocytosis of about 24, but according to previous documents, the patient has similar leukocytosis by the hydrolyzed WBC. The patient initially was treated with oxygen 2 liters by nasal cannula, since the patient's O2 sat was 84, diagnosed with acute respiratory failure. However, the patient is off oxygen after antibiotic treatment with nebulizer.  2.  Since the patient has a right foot ulcer which possibly infection, the patient was  suspected to have osteomyelitis, but MRI of right foot did not show any osteomyelitis.  Podiatry evaluated the patient and suggested no evidence of infection. The patient was initially treated with vancomycin and Zosyn, which were discontinued. The patient got wound care consult.  3.  History of pulmonary embolism. Dr. Meredeth IdeFleming evaluated the patient. He suggested the patient is not compliant with Pradaxa. The patient needs to follow up with his pulmonary physician. The patient needs to switch to Xarelto or Eliquis.  4.  Anemia, stable. The patient has no complaints. Even though the patient has leukocytosis at 24, the patient wants to go home. Clinically, he is stable. He needs to follow up WBC with PCP. In addition, the patient needs to follow up with his pulmonary physician for the treatment of PE.   I discussed the patient's condition and plan of treatment with the patient, nurse and case manager.   TIME SPENT: About 38 minutes.   ____________________________ Shaune PollackQing Troi Bechtold, MD qc:dmm D: 02/04/2014 16:31:20 ET T: 02/04/2014 18:16:23 ET JOB#: [id number]  cc: Shaune PollackQing Allyce Bochicchio, MD, <Dictator> Shaune PollackQING Sylvia Helms MD ELECTRONICALLY SIGNED 02/05/2014 15:28

## 2014-12-18 NOTE — H&P (Signed)
PATIENT NAME:  [name], [name] MR#:  [id number] DATE OF BIRTH:  [birth year]  DATE OF ADMISSION:  02/01/2014  PRIMARY CARE PROVIDER: [facility]  CHIEF COMPLAINT: Shortness of breath.  HISTORY OF PRESENT ILLNESS: The patient is a 69 year old African American male with history of previous pulmonary embolism and a DVT in the past who also has a sickle cell trait who was last hospitalized here in 2013 with shortness of breath. At that time had a CT per PE protocol that showed evidence of pulmonary hypertension. The patient was admitted and given treatment for atypical pneumonia. At that time got better and was discharged. Since then he has not had any significant issues with shortness of breath, however, he is a very poor historian. He over the past few months has symptoms of congestion and cough, also having substernal chest pain with coughing. He progressively has dyspnea on exertion and comes to the ED. In the ER, he again had a CT per PE protocol which showed no evidence of pulmonary embolism. Again, it is showing pulmonary arteries to be elevated. He also has a WBC count that is 25.8. In reviewing his previous records from 2013, he had similar elevated leukocytosis, but it was less. The patient does have an ulcer on his right leg for which he was being followed at the wound care; however, he is not going there anymore. His wife changes the dressing whenever he allows her to change the dressing. The patient has not had any fevers or chills. Denies any nausea, vomiting or diarrhea. Denies any urinary symptoms.   PAST MEDICAL HISTORY:  1.  DVT.  2.  Tobacco use in the past, which he quit. 3.  Sickle cell trait. 4.  Renal cysts. 5.  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  6.  Anemia, hemolytic.  7.  Cholelithiasis.  8.  Pulmonary embolism.   PAST SURGICAL HISTORY: 1.  Bilateral cataract surgery. 2.  Splenectomy at age 74 months. 3.  Partial left amputation.  4.  Cholecystectomy. 5.  IVC filter  placement in 2012.   MEDICATIONS: The patient reports that he is taking Pradaxa 150 one tab p.o. b.i.d. on an intermittent bases not on a daily basis, vitamin B12 100 mcg daily, multivitamin daily, folic acid 1 mg daily.   SOCIAL HISTORY: Smoking, quit a long time ago. No alcohol or drug use.   FAMILY HISTORY: Denies any diabetes, coronary artery disease, or any cancer.   REVIEW OF SYSTEMS: CONSTITUTIONAL: Denies any fevers, fatigue, or weakness. No weight loss. No weight gain.  EYES: No blurred or double vision. No pain. No redness. No inflammation. No glaucoma. No cataracts.  ENT: No tinnitus. No ear pain. No hearing loss. No seasonal or year-round allergies. Complains of postnasal drip. No difficulty swallowing.  RESPIRATORY: Complains of dry cough, but no wheezing. No hemoptysis. Complains of dyspnea.  CARDIOVASCULAR: Complains of chest pain with coughing. No orthopnea or edema. No arrhythmia.  GASTROINTESTINAL: No nausea, vomiting, diarrhea. No abdominal pain. No hematemesis. No melena.  GENITOURINARY: Denies any dysuria, hematuria, renal calculus or frequency.  ENDOCRINE: Denies any polydipsia, nocturia or thyroid problems.  HEMATOLOGIC AND LYMPHATIC: Has a history of hemolytic anemia, sickle cell trait.  SKIN: Denies any acne. Has an ulcer on the right lateral aspect of his ankle.  MUSCULOSKELETAL: Denies any pain in the neck, back, or shoulder.  NEUROLOGIC: No numbness, CVA, TIA. PSYCHIATRIC: No anxiety, insomnia, or ADD.   PHYSICAL EXAMINATION: VITAL SIGNS: Temperature 98.1, pulse 100, respirations 22, blood  pressure 139/83. GENERAL: The patient is a well-developed, well-nourished male in no acute distress.  HEENT: Head atraumatic, normocephalic. Pupils equally round and reactive to light and accommodation. There is no conjunctival pallor. No scleral icterus. Nasal exam shows no drainage. No erythema.  NECK: Supple without any JVD.  CARDIOVASCULAR: Regular rate and rhythm. No  murmurs, rubs, clicks, or gallops.  LUNGS: Clear to auscultation bilaterally without any rales, rhonchi, wheezing.  ABDOMEN: Soft, nontender, nondistended. Positive bowel sounds x4.  EXTREMITIES: No clubbing, cyanosis, or edema.  SKIN: He has an ulcer on the right leg with purulent exudate noted on the wound. There is no foul smell. There is no erythema or warmth around the ulcer.  NEUROLOGIC: Awake, alert and oriented x3. No focal deficits.  PSYCHIATRIC: Not anxious or depressed.  LYMPH: Nodes not palpable. VASCULAR: Good DP and PT pulses.  DIAGNOSTIC DATA: Glucose 127, BUN 24, creatinine 0.88, sodium 140, potassium 3.4, chloride 105, CO2 27, calcium 8.4. LFTs are normal, except for bilirubin total of 13.7. Troponin less than 0.02. WBC 15.8, hemoglobin 11.2, platelet count 366,000. INR 1.5. Lactic acid was 1.8.   ASSESSMENT AND PLAN: The patient is a 69 year old white male with history of sickle cell trait and history of pulmonary embolus who presents with shortness of breath, cough and congestion. 1.  Shortness of breath over the past few months associated with cough and congestion. The patient may have possible atypical pneumonia. At this time, he will be treated for his right foot ulcer and possible infection so that should be able to cover for a lung process as well. He will need a pulmonary evaluation. He also will likely need a sleep study as an outpatient. I will add some Mucinex and Flonase to his current treatment.  2.  Leukocytosis. Possibly due to right foot ulcer and infection. We will get a MRI of the foot, wound care consult, and give vancomycin and Zosyn for now.  3.  History of pulmonary embolus, noncompliant with Pradaxa. CT negative for pulmonary embolus. Will are going to Doppler his legs.  4.  Chronic leg wound. Will get a MRI of the leg. Rule out osteomyelitis. Get wound a care consult.  5.  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 We will need to monitor that.  6.   Miscellaneous. The patient will be on Lovenox for deep vein thrombosis prophylaxis.   TIME SPENT: 45 minutes.   ____________________________ Lacie ScottsShreyang H. Allena KatzPatel, MD shp:sb D: 02/01/2014 12:45:10 ET T: 02/01/2014 13:20:19 ET JOB#: [id number]  cc: Roizy Harold H. Allena KatzPatel, MD, <Dictator> Charise CarwinSHREYANG H Damiel Barthold MD ELECTRONICALLY SIGNED 02/09/2014 16:58

## 2014-12-18 NOTE — Discharge Summary (Signed)
PATIENT NAME:  [name], [name] MR#:  [id number] DATE OF BIRTH:  Dec 31, 1945  DATE OF ADMISSION:  03/04/2014 DATE OF DISCHARGE:  03/05/2014  ADMITTING DIAGNOSIS: Shortness of breath.   DISCHARGE DIAGNOSES:  1. Shortness of breath due to possible chronic obstructive pulmonary disease exacerbation. No history of chronic obstructive pulmonary disease in the past. Has a pulmonary appointment as outpatient.  2. Leukocytosis. Has a history of sickle cell trait seen by hematology. Will need outpatient hematology followup. Had a flow cytometry. Results are currently pending.  3. History of pulmonary embolism and deep vein thrombosis in the past. Has been noncompliant with his medications, status post CT of the chest negative for pulmonary embolism.  4. Pulmonary hypertension, possibly related to underlying lung disease. Based on the CT scan, needs outpatient pulmonary followup.   PERTINENT LABS AND EVALUATIONS: Admitting glucose 131, BUN 23, creatinine 1.19, sodium 140, potassium 3.4, chloride 108, CO2 was 21. Calcium was 8.1. His BNP was 9652. Troponin less than 0.02. WBC 30.5, hemoglobin 11.4, platelet count was 344,000.   PA and lateral chest x-ray shows mild cardiac enlargement, emphysematous changes in the lungs. CT per PE protocol showed no evidence of PE. ABG showed a pH of 7.40, pCO2 of 32, pO2 of 55. Echocardiogram of the heart showed normal ejection fraction, moderately elevated pulmonary artery systolic pressure.   HOSPITAL COURSE: Please refer to H and P done by the admitting physician. The patient is a 69 year old African American male with a history of having shortness of breath, came to the hospital after he was exerting himself and started having shortness of breath and also complained of insect bite on his back. The patient was evaluated in the ED and noted to have hypoxia. He also had a CT per PE protocol which showed negative PE but did show findings consistent with pulmonary  hypertension. He was seen in consultation by pulmonary. The patient was also having some wheezing and thought to have a COPD flare. The patient was treated with nebulizers and steroids. He is feeling better. He wants to go home today. However, he is still having shortness of breath and likely has chronic hypoxia. At this time home O2 has been arranged for him for discharge. The patient is stable for discharge with outpatient close followup with pulmonary.   DISCHARGE MEDICATIONS: Vitamin B12 at 100 mcg daily, folic acid 1 mg daily, multivitamin 1 tab p.o. daily, fish oil 1 tab p.o. daily, cetirizine 10 at 1 tab p.o. daily, Pradaxa 150 at 1 tab p.o. b.i.d., prednisone taper starting at 60, taper by 10 until complete. Combivent 1 puff 4 times a day as needed, Advair 250/50 at 1 puff b.i.d., Spiriva 18 mcg daily, 02 at 2 liters of nasal cannula continuously at all times.   ACTIVITY: As tolerated.   FOLLOWUP: Follow up with a primary physician in 1 to 2 weeks, Dr. Meredeth IdeFleming, as scheduled. Also follow up with hematology as outpatient.   TIME SPENT: 35 minutes on this patient.    ____________________________ Lacie ScottsShreyang H. Allena KatzPatel, MD shp:lt D: 03/05/2014 18:31:53 ET T: 03/06/2014 06:01:16 ET JOB#: [id number]  cc: Arda Keadle H. Allena KatzPatel, MD, <Dictator> Charise CarwinSHREYANG H Delancey Moraes MD ELECTRONICALLY SIGNED 03/14/2014 9:44
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
## 2014-12-19 NOTE — H&P (Signed)
PATIENT NAME:  [name], [name] MR#:  [id number] DATE OF BIRTH:  Feb 28, 1946  DATE OF ADMISSION:  08/29/2011  PRIMARY CARE PHYSICIAN: Dr. Jorene Guest  CHIEF COMPLAINT: Shortness of breath.   HISTORY OF PRESENT ILLNESS: This is a 69 year old male who has a history of a pulmonary embolus two years ago, he has been on Coumadin ever since. He says around Thanksgiving he developed coughing, was treated with some antibiotics and an inhaler. He said he got a little better, but has been gradually getting more short of breath to the point last few days he could not even walk across the room. He denies any chest pain. He said he still has some cough. He has had some night sweats. He denies any chest pain. Here in the Emergency Room CT scan shows no pulmonary embolus but does show possible pulmonary hypertension and he is hypoxic.  PAST MEDICAL HISTORY:  1. Pulmonary embolism two years ago, on chronic Coumadin.  2. Right ankle ulcer being seen in the [facility].   PAST SURGICAL HISTORY:  1. 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  2. Splenectomy.  3. Cholecystectomy.   ALLERGIES: No known drug allergies.   CURRENT MEDICATIONS:  1. Coumadin 5 mg daily.  2. Vitamin B12 1000 mcg q. month.  3. Folic acid 1 mg daily.   SOCIAL HISTORY: He stopped smoking 10 years ago. Drinks alcohol occasionally.   FAMILY HISTORY: Significant for coronary artery disease.   REVIEW OF SYSTEMS: CONSTITUTIONAL: He has had fever. EYES: No blurred vision. ENT: No hearing loss. CARDIOVASCULAR: No chest pain. PULMONARY: Shortness of breath. GI: No nausea, vomiting, or diarrhea. GENITOURINARY: No dysuria. ENDOCRINE: No heat or cold intolerance. INTEGUMENT: No rash. MUSCULOSKELETAL: Occasional joint pain. NEUROLOGIC: No numbness or weakness.   PHYSICAL EXAMINATION:  VITAL SIGNS: Pulse 105, respiration 26, blood pressure 127/62.   GENERAL: This is a well-nourished black male who is short of breath.   HEENT: The pupils  are equal, round, reactive to light. Sclerae is not icteric. The oral mucosa is moist. Oropharynx is clear. Nasopharynx is clear.  NECK: Supple. No JVD, lymphadenopathy or thyromegaly.   CARDIOVASCULAR: Tachy with no murmurs.   LUNGS: Clear to auscultation. No dullness to percussion. He is not using accessory muscles.   ABDOMEN: Soft, nontender, nondistended. Bowel sounds are positive. No hepatosplenomegaly. No masses.   EXTREMITIES: Trace lower extremity edema. The right ankle has a small ulcer that is healing.   NEUROLOGIC: Cranial nerves II through XII are intact. He is alert and oriented x4.   SKIN: Moist with no rash.   LABORATORY, DIAGNOSTIC AND RADIOLOGICAL DATA: CT scan of the chest shows no sign of pulmonary emboli. There is some enlargement of the main pulmonary arteries consistent with pulmonary hypertension. pH 7.46, pCO2 31, pO2 54 with oxygen saturation of 89% on room air. BNP 5299. White blood cells 19, hemoglobin 11.3, BUN 12, creatinine 0.85, INR 2.8.   ASSESSMENT AND PLAN:  1. Acute respiratory failure. He may have some chronic components of this but I do not have any old records to prove this but with indications of possible pulmonary hypertension I suspect he has had some chronic hypoxia that has driven this, perhaps first developed from a pulmonary embolus. Will go ahead and treat him with supplemental oxygen. Repeat his blood gas. Get an echocardiogram to evaluate for pulmonary hypertension and get pulmonology to see him also. His BNP is elevated which may represent some heart failure but before diagnosing him with this  we would want to intrigue any further I would want to get results of an echocardiogram to see exactly what we are dealing with. He has already had some diuretics here in the ER but I'm not going to give him any further diuretics until we get results of the echo.  2. Acute bronchitis. I suspect this may be playing into it with a high white count and cough. No  pneumonia on the x-ray. Will go ahead and put him on some antibiotics and get sputum cultures.  3. Right foot ulcer. He is being treated by the wound center. I will ask them to continue care here in the hospital.  4. Possible pulmonary hypertension. Again, I am going to get an echocardiogram to further evaluate. He may even possibly need right side heart catheterization depending on results and will also get pulmonary to see him and further treatment will depend on results.   TIME SPENT ON ADMISSION: 65 minutes.   ____________________________ Gracelyn NurseJohn D. Timesha Cervantez, MD jdj:cms D: 08/29/2011 12:07:40 ET T: 08/29/2011 12:30:33 ET JOB#: [id number]  cc: Gracelyn NurseJohn D. Loye Vento, MD, <Dictator> Abbey ChattersLloyd K. [name], MD Gracelyn NurseJOHN D Javion Holmer MD ELECTRONICALLY SIGNED 08/31/2011 10:11

## 2014-12-19 NOTE — Discharge Summary (Signed)
PATIENT NAME:  [name], [name] MR#:  [id number] DATE OF BIRTH:  06/03/1946  DATE OF ADMISSION:  08/29/2011 DATE OF DISCHARGE:  08/30/2011  PRIMARY CARE PHYSICIAN: [name], MD  PULMONOLOGIST: [name], MD [facility]  CARDIOLOGIST: [name], MD  DISCHARGE DIAGNOSES:  1. Acute on chronic respiratory failure likely due to underlying acute bronchitis/chronic obstructive pulmonary disease, now improving and feels back to her baseline breathing. No congestive heart failure.  2. Acute bronchitis, on antibiotic, improving.  3. Chronic venous stasis ulcer, no infection, [facility].   SECONDARY DIAGNOSIS: History of pulmonary embolus, on chronic Coumadin.   CONSULTANTS:  1. [name], MD - Pulmonary. 2. Wound Management.   PROCEDURES/RADIOLOGY:  2-D echocardiogram on 08/30/2011: Normal LV function, no thrombus, LVEF more than 55%.   CT scan of the chest with PE protocol on 08/29/2011: No pulmonary embolism. Possible pulmonary edema/atypical infection/hypersensitivity pneumonitis. Enlarged pulmonary artery likely seen with pulmonary arterial hypertension.  Chest x-ray on 08/29/2011: Clear lung fields. Mild cardiomegaly.   HISTORY AND SHORT HOSPITAL COURSE: The patient is a 69 year old male with the above-mentioned medical problems who was admitted for acute respiratory failure thought to be due to acute bronchitis/chronic obstructive pulmonary disease and was started on antibiotics and had significant improvement in his breathing. He was also evaluated by the wound management team and underwent dressing changes while in the hospital. He had a right lateral ankle venous stasis ulcer measuring 4.2 cm x 1.3 cm x 0.2 cm with moderate amount of serosanguineous discharge. This was healthy and healing, per wound management. He was also evaluated by pulmonary, Dr. [name], while in the hospital who recommended continuing current management. He also  recommended outpatient pulmonary follow-up. The patient was doing much better on 08/30/2011 and he was oxygenating at baseline and was discharged home in stable condition.   PHYSICAL EXAMINATION:   VITAL SIGNS: On the date of discharge, his temperature was 98.1, heart rate 97 per minute, respirations 20 per minute, blood pressure 114/78 mmHg, and he was saturating 90% on room air.   HEART: S1 and S2 normal. No murmurs, rubs, or gallops.   LUNGS: Clear to auscultation bilaterally. No wheezing, rales, rhonchi, or crepitation.   ABDOMEN: Soft, benign.   NEURO: Nonfocal examination.   All other physical examination remained at baseline.   DISCHARGE MEDICATIONS:  1. Folic acid 1 mg p.o. daily. 2. Coumadin 5 mg three tablets p.o. at bedtime.  3. Vitamin B12 100 mcg p.o. daily.  4. Levaquin 500 mg p.o. daily for four days.   DISCHARGE DIET: Low sodium.   DISCHARGE ACTIVITY: As tolerated.   DISCHARGE INSTRUCTIONS AND FOLLOW-UP: The patient was instructed to follow up with his primary care physician, Dr. [name], in 1 to 2 weeks. He will  need follow-up with [facility] [name] [facility] in 1 to 2 weeks. He will need followup with Dr. [name] from [facility] in 2 to 3 weeks. He was instructed to get PT-INR check on Monday, 09/03/2011 and 09/05/2011 with results forwarded to primary care physician for Coumadin dose adjustment. He was also instructed to followup with [facility] in 1 week. His PT/INR was within range with INR of 2.7, on the date of discharge.   TIME SPENT: 55 minutes.  ____________________________ [name], MD vss:slb D: 09/02/2011 23:36:00 ET T: 09/04/2011 11:52:43 ET JOB#: [id number]  cc: Deonne Rooks S. Sherryll BurgerShah, MD, <Dictator> Abbey ChattersLloyd K. [name], MD Antonieta Ibaimothy J. Gollan, MD [name], MD - [facility]  Ellamae Sia [facility] MD ELECTRONICALLY SIGNED 09/04/2011 19:52

## 2016-01-17 ENCOUNTER — Encounter: Payer: Medicare HMO | Attending: Internal Medicine | Admitting: Internal Medicine

## 2016-01-17 DIAGNOSIS — Z86718 Personal history of other venous thrombosis and embolism: Secondary | ICD-10-CM | POA: Insufficient documentation

## 2016-01-17 DIAGNOSIS — I87331 Chronic venous hypertension (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: Secondary | ICD-10-CM | POA: Diagnosis present

## 2016-01-17 DIAGNOSIS — Z7901 Long term (current) use of anticoagulants: Secondary | ICD-10-CM | POA: Insufficient documentation

## 2016-01-17 DIAGNOSIS — L97311 Non-pressure chronic ulcer of right ankle limited to breakdown of skin: Secondary | ICD-10-CM | POA: Diagnosis not present

## 2016-01-17 DIAGNOSIS — Y839 Surgical procedure, unspecified as the cause of abnormal reaction of the patient, or of later complication, without mention of misadventure at the time of the procedure: Secondary | ICD-10-CM | POA: Diagnosis not present

## 2016-01-17 DIAGNOSIS — Z87891 Personal history of nicotine dependence: Secondary | ICD-10-CM | POA: Insufficient documentation

## 2016-01-17 DIAGNOSIS — T85613A 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, initial encounter: Secondary | ICD-10-CM | POA: Insufficient documentation

## 2016-01-17 DIAGNOSIS — J449 Chronic obstructive pulmonary disease, unspecified: Secondary | ICD-10-CM | POA: Diagnosis not present

## 2016-01-18 ENCOUNTER — Ambulatory Visit: Payer: Self-pay | Admitting: Internal Medicine

## 2016-01-19 NOTE — Progress Notes (Signed)
[name], [name] ([id number]) Visit Report for 01/17/2016 Allergy List Details [name] Date of Service: 01/17/2016 12:45 PM Patient Name: C. Patient Account Number: [id number] Medical Record Treating RN: Huel CoventryWoody, Kim [id number] Number: Other Clinician: Date of Birth/Sex: May 28, 1946 [age] y.o. Male) Treating [name], [name] Primary Care Physician: Physician/Extender: G Referring Physician: SELF, REFERRED Weeks in Treatment: 0 Allergies Active Allergies No Known Drug Allergies Allergy Notes Electronic Signature(s) Signed: 01/18/2016 5:05:53 PM By: Elliot GurneyWoody, RN, BSN, Kim RN, BSN Entered By: Elliot GurneyWoody, RN, BSN, Kim on 01/17/2016 13:10:00 [name], [name] ([id number]) -------------------------------------------------------------------------------- Arrival Information Details [name] Date of Service: 01/17/2016 12:45 PM Patient Name: C. Patient Account Number: [id number] Medical Record Treating RN: Huel CoventryWoody, Kim [id number] Number: Other Clinician: Date of Birth/Sex: May 28, 1946 [age] y.o. Male) Treating [name], [name] Primary Care Physician: Physician/Extender: G Referring Physician: SELF, REFERRED Weeks in Treatment: 0 Visit Information Patient Arrived: Ambulatory Arrival Time: 12:54 Accompanied By: self Transfer Assistance: None Patient Identification Verified: Yes Secondary Verification Process Yes Completed: Patient Requires Transmission- No Based Precautions: Patient Has Alerts: Yes Patient Alerts: Patient on Blood Thinner Warfarin Electronic Signature(s) Signed: 01/18/2016 5:05:53 PM By: Elliot GurneyWoody, RN, BSN, Kim RN, BSN Entered By: Elliot GurneyWoody, RN, BSN, Kim on 01/17/2016 12:55:09 [name], [name] ([id number]) -------------------------------------------------------------------------------- Clinic Level of Care Assessment Details [name] Date of Service: 01/17/2016 12:45 PM Patient Name: C. Patient Account Number: [id number] Medical Record Treating RN: Huel CoventryWoody,  Kim [id number] Number: Other Clinician: Date of Birth/Sex: May 28, 1946 [age] y.o. Male) Treating [name], [name] Primary Care Physician: Physician/Extender: G Referring Physician: SELF, REFERRED Weeks in Treatment: 0 Clinic Level of Care Assessment Items TOOL 1 Quantity Score []  - Use when EandM and Procedure is performed on INITIAL visit 0 ASSESSMENTS - Nursing Assessment / Reassessment X - General Physical Exam (combine w/ comprehensive assessment (listed just 1 20 below) when performed on new pt. evals) X - Comprehensive Assessment (HX, ROS, Risk Assessments, Wounds Hx, etc.) 1 25 ASSESSMENTS - Wound and Skin Assessment / Reassessment []  - Dermatologic / Skin Assessment (not related to wound area) 0 ASSESSMENTS - Ostomy and/or Continence Assessment and Care []  - Incontinence Assessment and Management 0 []  - Ostomy Care Assessment and Management (repouching, etc.) 0 PROCESS - Coordination of Care X - Simple Patient / Family Education for ongoing care 1 15 []  - Complex (extensive) Patient / Family Education for ongoing care 0 X - Staff obtains ChiropractorConsents, Records, Test Results / Process Orders 1 10 []  - Staff telephones HHA, Nursing Homes / Clarify orders / etc 0 []  - Routine Transfer to another Facility (non-emergent condition) 0 []  - Routine Hospital Admission (non-emergent condition) 0 X - New Admissions / Manufacturing engineernsurance Authorizations / Ordering NPWT, Apligraf, etc. 1 15 []  - Emergency Hospital Admission (emergent condition) 0 PROCESS - Special Needs []  - Pediatric / Minor Patient Management 0 [name], [name] ([id number]) []  - Isolation Patient Management 0 []  - Hearing / Language / Visual special needs 0 []  - Assessment of Community assistance (transportation, D/C planning, etc.) 0 []  - Additional assistance / Altered mentation 0 []  - Support Surface(s) Assessment (bed, cushion, seat, etc.) 0 INTERVENTIONS - Miscellaneous []  - External ear exam 0 []  - Patient Transfer (multiple  staff / Nurse, adultHoyer Lift / Similar devices) 0 []  - Simple Staple / Suture removal (25 or less) 0 []  - Complex Staple / Suture removal (26 or more) 0 []  - Hypo/Hyperglycemic Management (do not check if billed separately) 0 X - Ankle / Brachial Index (ABI) - do not check if billed  separately 1 15 Has the patient been seen at the hospital within the last three years: Yes Total Score: 100 Level Of Care: New/Established - Level 3 Electronic Signature(s) Signed: 01/18/2016 5:05:53 PM By: Elliot Gurney, RN, BSN, Kim RN, BSN Entered By: Elliot Gurney, RN, BSN, Kim on 01/17/2016 14:10:18 [name] ([id number]) -------------------------------------------------------------------------------- Encounter Discharge Information Details [name] Ringer Date of Service: 01/17/2016 12:45 PM Patient Name: C. Patient Account Number: [id number] Medical Record Treating RN: Huel Coventry [id number] Number: Other Clinician: Date of Birth/Sex: February 02, 1946 (70 y.o. Male) Treating [name], [name] Primary Care Physician: Physician/Extender: G Referring Physician: SELF, REFERRED Weeks in Treatment: 0 Encounter Discharge Information Items Discharge Pain Level: 0 Discharge Condition: Stable Ambulatory Status: Ambulatory Discharge Destination: Home Transportation: Private Auto Accompanied By: wife Schedule Follow-up Appointment: No Medication Reconciliation completed and provided to Patient/Care No [name]: Provided on Clinical Summary of Care: 01/17/2016 Form Type Recipient Paper Patient LB Electronic Signature(s) Signed: 01/18/2016 5:05:53 PM By: Elliot Gurney RN, BSN, Kim RN, BSN Previous Signature: 01/17/2016 2:10:06 PM Version By: Gwenlyn Perking Entered By: Elliot Gurney RN, BSN, Kim on 01/17/2016 14:11:21 [name] ([id number]) -------------------------------------------------------------------------------- Lower Extremity Assessment Details [name] Ringer Date of Service: 01/17/2016 12:45 PM Patient Name: C.  Patient Account Number: [id number] Medical Record Treating RN: Huel Coventry [id number] Number: Other Clinician: Date of Birth/Sex: 31-Aug-1945 (70 y.o. Male) Treating [name], [name] Primary Care Physician: Physician/Extender: G Referring Physician: SELF, REFERRED Weeks in Treatment: 0 Edema Assessment Assessed: [Left: No] [Right: No] Edema: [Left: N] [Right: o] Vascular Assessment Pulses: Posterior Tibial Palpable: [Right:No] Doppler: [Right:Monophasic] Dorsalis Pedis Palpable: [Right:No] Doppler: [Right:Monophasic] Extremity colors, hair growth, and conditions: Extremity Color: [Right:Hyperpigmented] Hair Growth on Extremity: [Right:No] Temperature of Extremity: [Right:Warm] Capillary Refill: [Right:< 3 seconds] Blood Pressure: Brachial: [Right:136] Dorsalis Pedis: [Left:Dorsalis Pedis: 160] Ankle: Posterior Tibial: [Left:Posterior Tibial: 160] [Right:1.18] Toe Nail Assessment Left: Right: Thick: Yes Discolored: Yes Deformed: No Improper Length and Hygiene: No Electronic Signature(s) Signed: 01/18/2016 5:05:53 PM By: Elliot Gurney, RN, BSN, Kim RN, BSN Entered By: Elliot Gurney, RN, BSN, Kim on 01/17/2016 13:06:58 [name] ([name], [name] ([id number]) -------------------------------------------------------------------------------- Multi Wound Chart Details [name] Ringer Date of Service: 01/17/2016 12:45 PM Patient Name: C. Patient Account Number: [id number] Medical Record Treating RN: Huel Coventry [id number] Number: Other Clinician: Date of Birth/Sex: August 07, 1946 (70 y.o. Male) Treating [name], [name] Primary Care Physician: Physician/Extender: G Referring Physician: SELF, REFERRED Weeks in Treatment: 0 Vital Signs Height(in): 76 Pulse(bpm): 78 Weight(lbs): 238 Blood Pressure 136/70 (mmHg): Body Mass Index(BMI): 29 Temperature(F): 97.7 Respiratory Rate 18 (breaths/min): Photos: [1:No Photos] [N/A:N/A] Wound Location: [1:Right Malleolus -  Lateral] [N/A:N/A] Wounding Event: [1:Gradually Appeared] [N/A:N/A] Primary Etiology: [1:Venous Leg Ulcer] [N/A:N/A] Date Acquired: [1:08/11/2015] [N/A:N/A] Weeks of Treatment: [1:0] [N/A:N/A] Wound Status: [1:Open] [N/A:N/A] Measurements L x W x D 5.5x4x0.3 [N/A:N/A] (cm) Area (cm) : [1:17.279] [N/A:N/A] Volume (cm) : [1:5.184] [N/A:N/A] Classification: [1:Full Thickness Without Exposed Support Structures] [N/A:N/A] Exudate Amount: [1:Medium] [N/A:N/A] Exudate Type: [1:Serous] [N/A:N/A] Exudate Color: [1:amber] [N/A:N/A] Wound Margin: [1:Flat and Intact] [N/A:N/A] Granulation Amount: [1:None Present (0%)] [N/A:N/A] Necrotic Amount: [1:Large (67-100%)] [N/A:N/A] Exposed Structures: [1:Fascia: No Fat: No Tendon: No Muscle: No Joint: No Bone: No Limited to Skin Breakdown] [N/A:N/A] Epithelialization: None N/A N/A Periwound Skin Texture: Scarring: Yes N/A N/A Edema: No Excoriation: No Induration: No Callus: No Crepitus: No Fluctuance: No Friable: No Rash: No Periwound Skin Maceration: No N/A N/A Moisture: Moist: No Dry/Scaly: No Periwound Skin Color: Hemosiderin Staining: Yes N/A N/A Atrophie Blanche: No Cyanosis: No Ecchymosis: No Erythema: No Mottled: No Pallor:  No Rubor: No Temperature: No Abnormality N/A N/A Tenderness on Yes N/A N/A Palpation: Wound Preparation: Ulcer Cleansing: N/A N/A Rinsed/Irrigated with Saline Topical Anesthetic Applied: Other: lidocaine 4% Treatment Notes Electronic Signature(s) Signed: 01/18/2016 5:05:53 PM By: Elliot Gurney, RN, BSN, Kim RN, BSN Entered By: Elliot Gurney, RN, BSN, Kim on 01/17/2016 13:19:34 [name] ([id number]) -------------------------------------------------------------------------------- Multi-Disciplinary Care Plan Details [name] Ringer Date of Service: 01/17/2016 12:45 PM Patient Name: C. Patient Account Number: [id number] Medical Record Treating RN: Huel Coventry [id number] Number: Other Clinician: Date of  Birth/Sex: Nov 16, 1945 (70 y.o. Male) Treating [name], [name] Primary Care Physician: Physician/Extender: G Referring Physician: SELF, REFERRED Weeks in Treatment: 0 Active Inactive Orientation to the Wound Care Program Nursing Diagnoses: Knowledge deficit related to the wound healing center program Goals: Patient/caregiver will verbalize understanding of the Wound Healing Center Program Date Initiated: 01/17/2016 Goal Status: Active Interventions: Provide education on orientation to the wound center Notes: Wound/Skin Impairment Nursing Diagnoses: Impaired tissue integrity Goals: Ulcer/skin breakdown will heal within 14 weeks Date Initiated: 01/17/2016 Goal Status: Active Interventions: Assess ulceration(s) every visit Notes: Electronic Signature(s) Signed: 01/18/2016 5:05:53 PM By: Elliot Gurney, RN, BSN, Kim RN, BSN Entered By: Elliot Gurney, RN, BSN, Kim on 01/17/2016 13:19:24 [name] ([id number]) -------------------------------------------------------------------------------- Pain Assessment Details [name] Ringer Date of Service: 01/17/2016 12:45 PM Patient Name: C. Patient Account Number: [id number] Medical Record Treating RN: Huel Coventry [id number] Number: Other Clinician: Date of Birth/Sex: [birth year] (70 y.o. Male) Treating [name], [name] Primary Care Physician: Physician/Extender: G Referring Physician: SELF, REFERRED Weeks in Treatment: 0 Active Problems Location of Pain Severity and Description of Pain Patient Has Paino No Site Locations Pain Management and Medication Current Pain Management: Electronic Signature(s) Signed: 01/18/2016 5:05:53 PM By: Elliot Gurney, RN, BSN, Kim RN, BSN Entered By: Elliot Gurney, RN, BSN, Kim on 01/17/2016 12:55:19 [name] ([id number]) -------------------------------------------------------------------------------- Patient/Caregiver Education Details [name] Ringer Date of Service: 01/17/2016 12:45 PM Patient Name: C. Patient  Account Number: [id number] Medical Record Treating RN: Huel Coventry [id number] Number: Other Clinician: Date of Birth/Gender: 1946-07-16 (70 y.o. Male) Treating [name], [name] Primary Care Physician: Physician/Extender: G Referring Physician: SELF, REFERRED Weeks in Treatment: 0 Education Assessment Education Provided To: Patient Education Topics Provided Wound/Skin Impairment: Handouts: Caring for Your Ulcer, Other: wound care as prescribed Methods: Demonstration, Explain/Verbal Responses: State content correctly Electronic Signature(s) Signed: 01/18/2016 5:05:53 PM By: Elliot Gurney, RN, BSN, Kim RN, BSN Entered By: Elliot Gurney, RN, BSN, Kim on 01/17/2016 14:11:44 [name] ([id number]) -------------------------------------------------------------------------------- Wound Assessment Details [name] Ringer Date of Service: 01/17/2016 12:45 PM Patient Name: C. Patient Account Number: [id number] Medical Record Treating RN: Huel Coventry [id number] Number: Other Clinician: Date of Birth/Sex: 1946-02-21 (70 y.o. Male) Treating [name], [name] Primary Care Physician: Physician/Extender: G Referring Physician: SELF, REFERRED Weeks in Treatment: 0 Wound Status Wound Number: 1 Primary Etiology: Venous Leg Ulcer Wound Location: Right Malleolus - Lateral Wound Status: Open Wounding Event: Gradually Appeared Date Acquired: 08/11/2015 Weeks Of Treatment: 0 Clustered Wound: No Photos Photo Uploaded By: Elliot Gurney, RN, BSN, Kim on 01/17/2016 13:24:35 Wound Measurements Length: (cm) 5.5 Width: (cm) 4 Depth: (cm) 0.3 Area: (cm) 17.279 Volume: (cm) 5.184 % Reduction in Area: % Reduction in Volume: Epithelialization: None Tunneling: No Undermining: No Wound Description Full Thickness Without Exposed Classification: Support Structures Wound Margin: Flat and Intact Exudate Medium Amount: Exudate Type: Serous Exudate Color: amber Wound Bed Granulation Amount: None Present (0%)  Exposed Structure Hornak, [name] ([id number]) Necrotic Amount: Large (67-100%) Fascia Exposed: No Necrotic Quality: Adherent Slough Fat Layer Exposed: No Tendon Exposed: No  Muscle Exposed: No Joint Exposed: No Bone Exposed: No Limited to Skin Breakdown Periwound Skin Texture Texture Color No Abnormalities Noted: No No Abnormalities Noted: No Callus: No Atrophie Blanche: No Crepitus: No Cyanosis: No Excoriation: No Ecchymosis: No Fluctuance: No Erythema: No Friable: No Hemosiderin Staining: Yes Induration: No Mottled: No Localized Edema: No Pallor: No Rash: No Rubor: No Scarring: Yes Temperature / Pain Moisture Temperature: No Abnormality No Abnormalities Noted: No Tenderness on Palpation: Yes Dry / Scaly: No Maceration: No Moist: No Wound Preparation Ulcer Cleansing: Rinsed/Irrigated with Saline Topical Anesthetic Applied: Other: lidocaine 4%, Treatment Notes Wound #1 (Right, Lateral Malleolus) 1. Cleansed with: Clean wound with Normal Saline 2. Anesthetic Topical Lidocaine 4% cream to wound bed prior to debridement 4. Dressing Applied: Santyl Ointment 5. Secondary Dressing Applied ABD and Kerlix/Conform Electronic Signature(s) Signed: 01/18/2016 5:05:53 PM By: Elliot Gurney, RN, BSN, Kim RN, BSN Entered By: Elliot Gurney, RN, BSN, Kim on 01/17/2016 13:09:40 [name] ([id number]) -------------------------------------------------------------------------------- Vitals Details [name] Ringer Date of Service: 01/17/2016 12:45 PM Patient Name: C. Patient Account Number: [id number] Medical Record Treating RN: Huel Coventry [id number] Number: Other Clinician: Date of Birth/Sex: September 26, 1945 (70 y.o. Male) Treating [name], [name] Primary Care Physician: Physician/Extender: G Referring Physician: SELF, REFERRED Weeks in Treatment: 0 Vital Signs Time Taken: 12:35 Temperature (F): 97.7 Height (in): 76 Pulse (bpm): 78 Source: Stated Respiratory Rate  (breaths/min): 18 Weight (lbs): 238 Blood Pressure (mmHg): 136/70 Source: Measured Reference Range: 80 - 120 mg / dl Body Mass Index (BMI): 29 Electronic Signature(s) Signed: 01/18/2016 5:05:53 PM By: Elliot Gurney, RN, BSN, Kim RN, BSN Entered By: Elliot Gurney, RN, BSN, Kim on 01/17/2016 12:58:34

## 2016-01-19 NOTE — Progress Notes (Signed)
[name], Rayshawn C. ([id number]) Visit Report for 01/17/2016 Abuse/Suicide Risk Screen Details [name], [name] Date of Service: 01/17/2016 12:45 PM Patient Name: C. Patient Account Number: [id number] Medical Record Treating RN: [name], [name] [id number] Number: Other Clinician: Date of Birth/Sex: [birth year] 5(70 y.o. Male) Treating [name], [name] Primary Care Physician/Extender: G Physician: Referring Physician: SELF, REFERRED Weeks in Treatment: 0 Abuse/Suicide Risk Screen Items Answer ABUSE/SUICIDE RISK SCREEN: Has anyone close to you tried to hurt or harm you recentlyo No Do you feel uncomfortable with anyone in your familyo No Has anyone forced you do things that you didnot want to doo No Do you have any thoughts of harming yourselfo No Patient displays signs or symptoms of abuse and/or neglect. No Electronic Signature(s) Signed: 01/18/2016 5:05:53 PM By: [name] GurneyWoody, RN, BSN, Kim RN, BSN Entered By: [name] GurneyWoody, RN, BSN, [name] on 01/17/2016 13:17:17 [name], [name] ([id number]) -------------------------------------------------------------------------------- Activities of Daily Living Details [name], [name] Date of Service: 01/17/2016 12:45 PM Patient Name: C. Patient Account Number: [id number] Medical Record Treating RN: [name], [name] [id number] Number: Other Clinician: Date of Birth/Sex: [birth year] [age] y.o. Male) Treating [name], [name] Primary Care Physician/Extender: G Physician: Referring Physician: SELF, REFERRED Weeks in Treatment: 0 Activities of Daily Living Items Answer Activities of Daily Living (Please select one for each item) Drive Automobile Completely Able Take Medications Completely Able Use Telephone Completely Able Care for Appearance Completely Able Use Toilet Completely Able Bath / Shower Completely Able Dress Self Completely Able Feed Self Completely Able Walk Completely Able Get In / Out Bed Completely Able Housework Completely Able Prepare Meals  Completely Able Handle Money Completely Able Shop for Self Completely Able Electronic Signature(s) Signed: 01/18/2016 5:05:53 PM By: [name] GurneyWoody, RN, BSN, Kim RN, BSN Entered By: [name] GurneyWoody, RN, BSN, [name] on 01/17/2016 13:17:26 [name], Colan C. ([id number]) -------------------------------------------------------------------------------- Education Assessment Details [name], [name] Date of Service: 01/17/2016 12:45 PM Patient Name: C. Patient Account Number: [id number] Medical Record Treating RN: [name], [name] [id number] Number: Other Clinician: Date of Birth/Sex: [birth year] [age] y.o. Male) Treating [name], [name] Primary Care Physician/Extender: G Physician: Referring Physician: SELF, REFERRED Weeks in Treatment: 0 Learning Preferences/Education Level/Primary Language Learning Preference: Explanation, Demonstration Highest Education Level: High School Preferred Language: English Cognitive Barrier Assessment/Beliefs Language Barrier: No Translator Needed: No Memory Deficit: No Emotional Barrier: No Cultural/Religious Beliefs Affecting Medical No Care: Physical Barrier Assessment Impaired Vision: No Impaired Hearing: No Knowledge/Comprehension Assessment Knowledge Level: High Comprehension Level: High Ability to understand written High instructions: Ability to understand verbal High instructions: Motivation Assessment Anxiety Level: Calm Cooperation: Cooperative Education Importance: Acknowledges Need Interest in Health Problems: Asks Questions Perception: Coherent Willingness to Engage in Self- High Management Activities: Readiness to Engage in Self- High Management Activities: [name], Reginold C. ([id number]) Electronic Signature(s) Signed: 01/18/2016 5:05:53 PM By: [name] GurneyWoody, RN, BSN, Kim RN, BSN Entered By: [name] GurneyWoody, RN, BSN, [name] on 01/17/2016 13:17:55 [name], Shyam C. ([id number]) -------------------------------------------------------------------------------- Fall Risk  Assessment Details [name], [name] Date of Service: 01/17/2016 12:45 PM Patient Name: C. Patient Account Number: [id number] Medical Record Treating RN: [name], [name] [id number] Number: Other Clinician: Date of Birth/Sex: [birth year] [age] y.o. Male) Treating [name], [name] Primary Care Physician/Extender: G Physician: Referring Physician: SELF, REFERRED Weeks in Treatment: 0 Fall Risk Assessment Items Have you had 2 or more falls in the last 12 monthso 0 No Have you had any fall that resulted in injury in the last 12 monthso 0 No FALL RISK ASSESSMENT: History of falling - immediate or within 3 months 0 No Secondary diagnosis 0 No Ambulatory aid None/bed rest/wheelchair/nurse 0  Yes Crutches/cane/walker 0 No Furniture 0 No IV Access/Saline Lock 0 No Gait/Training Normal/bed rest/immobile 0 Yes Weak 0 No Impaired 0 No Mental Status Oriented to own ability 0 Yes Electronic Signature(s) Signed: 01/18/2016 5:05:53 PM By: [name] Gurney, RN, BSN, Kim RN, BSN Entered By: [name] Gurney, RN, BSN, [name] on 01/17/2016 13:18:10 [name] Stack ([id number]) -------------------------------------------------------------------------------- Foot Assessment Details [name] Ringer Date of Service: 01/17/2016 12:45 PM Patient Name: C. Patient Account Number: [id number] Medical Record Treating RN: [name] Coventry [id number] Number: Other Clinician: Date of Birth/Sex: 05/29/1946 (70 y.o. Male) Treating [name], [name] Primary Care Physician/Extender: G Physician: Referring Physician: SELF, REFERRED Weeks in Treatment: 0 Foot Assessment Items Site Locations + = Sensation present, - = Sensation absent, C = Callus, U = Ulcer R = Redness, W = Warmth, M = Maceration, PU = Pre-ulcerative lesion F = Fissure, S = Swelling, D = Dryness Assessment Right: Left: Other Deformity: No No Prior Foot Ulcer: No No Prior Amputation: No No Charcot Joint: No No Ambulatory Status: Ambulatory Without Help Gait: Steady Electronic  Signature(s) Signed: 01/18/2016 5:05:53 PM By: [name] Gurney, RN, BSN, Kim RN, BSN Entered By: [name] Gurney, RN, BSN, [name] on 01/17/2016 13:18:49 [name] Stack ([id number]) [name], [name] ([id number]) -------------------------------------------------------------------------------- Nutrition Risk Assessment Details [name] Ringer Date of Service: 01/17/2016 12:45 PM Patient Name: C. Patient Account Number: [id number] Medical Record Treating RN: [name] Coventry [id number] Number: Other Clinician: Date of Birth/Sex: 07-Nov-1945 (70 y.o. Male) Treating [name], [name] Primary Care Physician/Extender: G Physician: Referring Physician: SELF, REFERRED Weeks in Treatment: 0 Height (in): 76 Weight (lbs): 238 Body Mass Index (BMI): 29 Nutrition Risk Assessment Items NUTRITION RISK SCREEN: I have an illness or condition that made me change the kind and/or 0 No amount of food I eat I eat fewer than two meals per day 0 No I eat few fruits and vegetables, or milk products 0 No I have three or more drinks of beer, liquor or wine almost every day 0 No I have tooth or mouth problems that make it hard for me to eat 0 No I don't always have enough money to buy the food I need 0 No I eat alone most of the time 0 No I take three or more different prescribed or over-the-counter drugs a 0 No day Without wanting to, I have lost or gained 10 pounds in the last six 0 No months I am not always physically able to shop, cook and/or feed myself 0 No Nutrition Protocols Good Risk Protocol 0 No interventions needed Moderate Risk Protocol Electronic Signature(s) Signed: 01/18/2016 5:05:53 PM By: [name] Gurney, RN, BSN, Kim RN, BSN Entered By: [name] Gurney, RN, BSN, [name] on 01/17/2016 13:18:18

## 2016-01-19 NOTE — Progress Notes (Signed)
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 Complaints or Symptoms Medical History: Negative for: Received Chemotherapy; Received Radiation Psychiatric Complaints and Symptoms: No Complaints or Symptoms Medical History: Negative for: Anorexia/bulimia; Confinement Anxiety HBO Extended History Items AMAIR, SHROUT ([id number]) Eyes: Cataracts Family and Social History Former smoker - 10 years ago; Marital Status - Married; Alcohol Use: Moderate; Drug Use: No History; Caffeine Use: Never; Advanced Directives: No; Living Will: No; Medical Power of Attorney: No Electronic Signature(s) Signed: 01/18/2016 7:52:30 AM By: [name] MD Signed: 01/18/2016 5:05:53 PM By: Elliot Gurney RN, BSN, Kim RN,  BSN Entered By: Elliot Gurney, RN, BSN, Kim on 01/17/2016 13:17:10 [name] ([id number]) -------------------------------------------------------------------------------- SuperBill Details [name] Date of Service: 01/17/2016 Patient Name: C. Patient Account Number: [id number] Medical Record Treating RN: Huel Coventry [id number] Number: Other Clinician: Date of Birth/Sex: Jan 08, 1946 (70 y.o. 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 ([id number]) Electronic Signature(s) Signed: 01/18/2016 7:52:30 AM By: Baltazar Najjarobson, [name] MD Entered By: Baltazar Najjarobson, [name] on 01/17/2016 14:59:38

## 2016-01-24 ENCOUNTER — Encounter: Payer: Medicare HMO | Admitting: Internal Medicine

## 2016-01-24 DIAGNOSIS — I87331 Chronic venous hypertension (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: Secondary | ICD-10-CM | POA: Diagnosis not present

## 2016-01-26 NOTE — Progress Notes (Signed)
[name], SURGEON ([id number]) Visit Report for 01/24/2016 Arrival Information Details PEACE, JOST Date of Service: 01/24/2016 10:45 AM Patient Name: C. Patient Account Number: [id number] Medical Record Treating RN: Curtis Sites [id number] Number: Other Clinician: Date of Birth/Sex: 1946-01-02 (70 y.o. Male) Treating [name], [name] Primary Care Physician: Physician/Extender: G Referring Physician: SELF, REFERRED Weeks in Treatment: 1 Visit Information History Since Last Visit Added or deleted any medications: No Patient Arrived: Ambulatory Any new allergies or adverse reactions: No Arrival Time: 10:39 Had a fall or experienced change in No Accompanied By: spouse activities of daily living that may affect Transfer Assistance: None risk of falls: Patient Identification Verified: Yes Signs or symptoms of abuse/neglect since last No Secondary Verification Process Yes visito Completed: Hospitalized since last visit: No Patient Requires Transmission- No Pain Present Now: No Based Precautions: Patient Has Alerts: Yes Patient Alerts: Patient on Blood Thinner Warfarin Electronic Signature(s) Signed: 01/24/2016 3:16:30 PM By: Curtis Sites Entered By: Curtis Sites on 01/24/2016 10:40:09 [name] ([id number]) -------------------------------------------------------------------------------- Encounter Discharge Information Details [name] Date of Service: 01/24/2016 10:45 AM Patient Name: C. Patient Account Number: [id number] Medical Record Treating RN: Curtis Sites [id number] Number: Other Clinician: Date of Birth/Sex: Dec 30, 1945 (70 y.o. Male) Treating [name], [name] Primary Care Physician: Physician/Extender: G Referring Physician: SELF, REFERRED Weeks in Treatment: 1 Encounter Discharge Information Items Schedule Follow-up Appointment: No Medication Reconciliation completed No and provided to Patient/Care Mithcell Schumpert: Provided on Clinical  Summary of Care: 01/24/2016 Form Type Recipient Paper Patient LB Electronic Signature(s) Signed: 01/24/2016 11:05:33 AM By: Gwenlyn Perking Entered By: Gwenlyn Perking on 01/24/2016 11:05:33 [name] ([id number]) -------------------------------------------------------------------------------- Multi Wound Chart Details [name] Date of Service: 01/24/2016 10:45 AM Patient Name: C. Patient Account Number: [id number] Medical Record Treating RN: Curtis Sites [id number] Number: Other Clinician: Date of Birth/Sex: 13-Apr-1946 (70 y.o. Male) Treating [name], [name] Primary Care Physician: Physician/Extender: G Referring Physician: SELF, REFERRED Weeks in Treatment: 1 Vital Signs Height(in): 76 Pulse(bpm): 79 Weight(lbs): 238 Blood Pressure 151/69 (mmHg): Body Mass Index(BMI): 29 Temperature(F): 98.1 Respiratory Rate 18 (breaths/min): Photos: [N/A:N/A] Wound Location: Right Malleolus - Lateral N/A N/A Wounding Event: Gradually Appeared N/A N/A Primary Etiology: Venous Leg Ulcer N/A N/A Comorbid History: Cataracts, Chronic N/A N/A Obstructive Pulmonary Disease (COPD), Osteoarthritis Date Acquired: 08/11/2015 N/A N/A Weeks of Treatment: 1 N/A N/A Wound Status: Open N/A N/A Measurements L x W x D 5.4x4x0.3 N/A N/A (cm) Area (cm) : 16.965 N/A N/A Volume (cm) : 5.089 N/A N/A % Reduction in Area: 1.80% N/A N/A % Reduction in Volume: 1.80% N/A N/A Classification: Full Thickness Without N/A N/A Exposed Support Structures Exudate Amount: Medium N/A N/A Exudate Type: Serous N/A N/A ORLANDER, NORWOOD ([id number]) Exudate Color: amber N/A N/A Wound Margin: Flat and Intact N/A N/A Granulation Amount: Small (1-33%) N/A N/A Granulation Quality: Pink N/A N/A Necrotic Amount: Large (67-100%) N/A N/A Exposed Structures: Fascia: No N/A N/A Fat: No Tendon: No Muscle: No Joint: No Bone: No Limited to Skin Breakdown Epithelialization: None N/A  N/A Periwound Skin Texture: Scarring: Yes N/A N/A Edema: No Excoriation: No Induration: No Callus: No Crepitus: No Fluctuance: No Friable: No Rash: No Periwound Skin Dry/Scaly: Yes N/A N/A Moisture: Maceration: No Moist: No Periwound Skin Color: Hemosiderin Staining: Yes N/A N/A Atrophie Blanche: No Cyanosis: No Ecchymosis: No Erythema: No Mottled: No Pallor: No Rubor: No Temperature: No Abnormality N/A N/A Tenderness on Yes N/A N/A Palpation: Wound Preparation: Ulcer Cleansing: N/A N/A Rinsed/Irrigated with Saline Topical Anesthetic Applied: Other: lidocaine 4% Treatment Notes Electronic  Signature(s) Signed: 01/24/2016 3:16:30 PM By: Barron Schmid, [name] ([id number]) Entered By: Curtis Sites on 01/24/2016 10:52:36 [name] ([id number]) -------------------------------------------------------------------------------- Multi-Disciplinary Care Plan Details [name] Date of Service: 01/24/2016 10:45 AM Patient Name: C. Patient Account Number: [id number] Medical Record Treating RN: Curtis Sites [id number] Number: Other Clinician: Date of Birth/Sex: Oct 23, 1945 (70 y.o. Male) Treating [name], [name] Primary Care Physician: Physician/Extender: G Referring Physician: SELF, REFERRED Weeks in Treatment: 1 Active Inactive Orientation to the Wound Care Program Nursing Diagnoses: Knowledge deficit related to the wound healing center program Goals: Patient/caregiver will verbalize understanding of the Wound Healing Center Program Date Initiated: 01/17/2016 Goal Status: Active Interventions: Provide education on orientation to the wound center Notes: Wound/Skin Impairment Nursing Diagnoses: Impaired tissue integrity Goals: Ulcer/skin breakdown will heal within 14 weeks Date Initiated: 01/17/2016 Goal Status: Active Interventions: Assess ulceration(s) every visit Notes: Electronic Signature(s) Signed: 01/24/2016 3:16:30 PM By:  Curtis Sites Entered By: Curtis Sites on 01/24/2016 10:52:28 [name] ([id number]) -------------------------------------------------------------------------------- Pain Assessment Details [name] Date of Service: 01/24/2016 10:45 AM Patient Name: C. Patient Account Number: [id number] Medical Record Treating RN: Curtis Sites [id number] Number: Other Clinician: Date of Birth/Sex: December 05, 1945 (70 y.o. Male) Treating [name], [name] Primary Care Physician: Physician/Extender: G Referring Physician: SELF, REFERRED Weeks in Treatment: 1 Active Problems Location of Pain Severity and Description of Pain Patient Has Paino No Site Locations With Dressing Change: No Pain Management and Medication Current Pain Management: Electronic Signature(s) Signed: 01/24/2016 3:16:30 PM By: Curtis Sites Entered By: Curtis Sites on 01/24/2016 10:40:39 [name] ([id number]) -------------------------------------------------------------------------------- Patient/Caregiver Education Details [name] Date of Service: 01/24/2016 10:45 AM Patient Name: C. Patient Account Number: [id number] Medical Record Treating RN: Curtis Sites [id number] Number: Other Clinician: Date of Birth/Gender: August 22, 1946 (70 y.o. Male) Treating [name], [name] Primary Care Physician: Physician/Extender: G Referring Physician: SELF, REFERRED Weeks in Treatment: 1 Education Assessment Education Provided To: Patient and Caregiver Education Topics Provided Wound/Skin Impairment: Handouts: Other: wound care to continue as ordered Methods: Demonstration, Explain/Verbal Responses: State content correctly Electronic Signature(s) Signed: 01/24/2016 3:16:30 PM By: Curtis Sites Entered By: Curtis Sites on 01/24/2016 11:05:56 [name] ([id number]) -------------------------------------------------------------------------------- Wound Assessment Details [name]  Date of Service: 01/24/2016 10:45 AM Patient Name: C. Patient Account Number: [id number] Medical Record Treating RN: Curtis Sites [id number] Number: Other Clinician: Date of Birth/Sex: [birth year] (70 y.o. Male) Treating [name], [name] Primary Care Physician: Physician/Extender: G Referring Physician: SELF, REFERRED Weeks in Treatment: 1 Wound Status Wound Number: 1 Primary Venous Leg Ulcer Etiology: Wound Location: Right Malleolus - Lateral Wound Open Wounding Event: Gradually Appeared Status: Date Acquired: 08/11/2015 Comorbid Cataracts, Chronic Obstructive Weeks Of Treatment: 1 History: Pulmonary Disease (COPD), Clustered Wound: No Osteoarthritis Photos Photo Uploaded By: Curtis Sites on 01/24/2016 10:48:45 Wound Measurements Length: (cm) 5.4 Width: (cm) 4 Depth: (cm) 0.3 Area: (cm) 16.965 Volume: (cm) 5.089 % Reduction in Area: 1.8% % Reduction in Volume: 1.8% Epithelialization: None Tunneling: No Undermining: No Wound Description Full Thickness Without Exposed Classification: Support Structures Wound Margin: Flat and Intact Exudate Medium Amount: Exudate Type: Serous Exudate Color: amber Foul Odor After Cleansing: No Wound Bed Devito, Harley C. ([id number]) Granulation Amount: Small (1-33%) Exposed Structure Granulation Quality: Pink Fascia Exposed: No Necrotic Amount: Large (67-100%) Fat Layer Exposed: No Necrotic Quality: Adherent Slough Tendon Exposed: No Muscle Exposed: No Joint Exposed: No Bone Exposed: No Limited to Skin Breakdown Periwound Skin Texture Texture Color No Abnormalities Noted: No No Abnormalities Noted: No Callus: No Atrophie Blanche: No Crepitus: No  Cyanosis: No Excoriation: No Ecchymosis: No Fluctuance: No Erythema: No Friable: No Hemosiderin Staining: Yes Induration: No Mottled: No Localized Edema: No Pallor: No Rash: No Rubor: No Scarring: Yes Temperature / Pain Moisture Temperature: No  Abnormality No Abnormalities Noted: No Tenderness on Palpation: Yes Dry / Scaly: Yes Maceration: No Moist: No Wound Preparation Ulcer Cleansing: Rinsed/Irrigated with Saline Topical Anesthetic Applied: Other: lidocaine 4%, Treatment Notes Wound #1 (Right, Lateral Malleolus) 1. Cleansed with: Clean wound with Normal Saline 2. Anesthetic Topical Lidocaine 4% cream to wound bed prior to debridement 4. Dressing Applied: Santyl Ointment 5. Secondary Dressing Applied ABD and Kerlix/Conform 7. Secured with Secretary/administratorTape Electronic Signature(s) Signed: 01/24/2016 3:16:30 PM By: Barron Schmidorthy, Joanna Gaughan, [name] ([id number]) Entered By: Curtis Sitesorthy, Joanna on 01/24/2016 10:43:36 [name], Blayton C. ([id number]) -------------------------------------------------------------------------------- Vitals Details [name] RingerBLACKWELL, Demetrius Date of Service: 01/24/2016 10:45 AM Patient Name: C. Patient Account Number: [id number] Medical Record Treating RN: Curtis SitesDorthy, Joanna [id number] Number: Other Clinician: Date of Birth/Sex: 1946/02/07 102(69 y.o. Male) Treating [name], [name] Primary Care Physician: Physician/Extender: G Referring Physician: SELF, REFERRED Weeks in Treatment: 1 Vital Signs Time Taken: 10:40 Temperature (F): 98.1 Height (in): 76 Pulse (bpm): 79 Weight (lbs): 238 Respiratory Rate (breaths/min): 18 Body Mass Index (BMI): 29 Blood Pressure (mmHg): 151/69 Reference Range: 80 - 120 mg / dl Electronic Signature(s) Signed: 01/24/2016 3:16:30 PM By: Curtis Sitesorthy, Joanna Entered By: Curtis Sitesorthy, Joanna on 01/24/2016 10:41:02
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
## 2016-01-31 ENCOUNTER — Other Ambulatory Visit
Admission: RE | Admit: 2016-01-31 | Discharge: 2016-01-31 | Disposition: A | Discharge status: Home / Self Care | Payer: Medicare HMO | Source: Ambulatory Visit | Attending: Internal Medicine | Admitting: Internal Medicine

## 2016-01-31 ENCOUNTER — Encounter: Payer: Medicare HMO | Attending: Internal Medicine | Admitting: Internal Medicine

## 2016-01-31 DIAGNOSIS — I87331 Chronic venous hypertension (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: Secondary | ICD-10-CM | POA: Insufficient documentation

## 2016-01-31 DIAGNOSIS — M1990 Unspecified osteoarthritis, unspecified site: Secondary | ICD-10-CM | POA: Insufficient documentation

## 2016-01-31 DIAGNOSIS — Z7901 Long term (current) use of anticoagulants: Secondary | ICD-10-CM | POA: Insufficient documentation

## 2016-01-31 DIAGNOSIS — J449 Chronic obstructive pulmonary disease, unspecified: Secondary | ICD-10-CM | POA: Insufficient documentation

## 2016-01-31 DIAGNOSIS — X58XXXA Exposure to other specified factors, initial encounter: Secondary | ICD-10-CM | POA: Insufficient documentation

## 2016-01-31 DIAGNOSIS — T85613A 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, initial encounter: Secondary | ICD-10-CM | POA: Insufficient documentation

## 2016-01-31 DIAGNOSIS — H269 Unspecified cataract: Secondary | ICD-10-CM | POA: Insufficient documentation

## 2016-01-31 DIAGNOSIS — L97313 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: Secondary | ICD-10-CM | POA: Insufficient documentation

## 2016-01-31 DIAGNOSIS — S91001A Unspecified open wound, right ankle, initial encounter: Secondary | ICD-10-CM | POA: Insufficient documentation

## 2016-02-01 NOTE — Progress Notes (Signed)
LEBRON, NAUERT ([id number]) Visit Report for 01/31/2016 Arrival Information Details HAMZAH, SAVOCA Date of Service: 01/31/2016 12:45 PM Patient Name: C. Patient Account Number: [id number] Medical Record Treating RN: Huel Coventry [id number] Number: Other Clinician: Date of Birth/Sex: 12-14-1945 (70 y.o. Male) Treating [name], [name] Primary Care Physician: Physician/Extender: G Referring Physician: SELF, REFERRED Weeks in Treatment: 2 Visit Information History Since Last Visit Added or deleted any medications: No Patient Arrived: Ambulatory Any new allergies or adverse reactions: No Arrival Time: 12:51 Had a fall or experienced change in No Accompanied By: wife activities of daily living that may affect Transfer Assistance: None risk of falls: Patient Identification Verified: Yes Signs or symptoms of abuse/neglect since last No Secondary Verification Process Yes visito Completed: Hospitalized since last visit: No Patient Requires Transmission- No Has Dressing in Place as Prescribed: Yes Based Precautions: Pain Present Now: No Patient Has Alerts: Yes Patient Alerts: Patient on Blood Thinner Warfarin Electronic Signature(s) Signed: 01/31/2016 4:33:07 PM By: Elliot Gurney, RN, BSN, Kim RN, BSN Entered By: Elliot Gurney, RN, BSN, Kim on 01/31/2016 12:52:07 [name] ([id number]) -------------------------------------------------------------------------------- Encounter Discharge Information Details [name] Date of Service: 01/31/2016 12:45 PM Patient Name: C. Patient Account Number: [id number] Medical Record Treating RN: Curtis Sites [id number] Number: Other Clinician: Date of Birth/Sex: 1945/10/14 (70 y.o. Male) Treating [name], [name] Primary Care Physician: Physician/Extender: G Referring Physician: SELF, REFERRED Weeks in Treatment: 2 Encounter Discharge Information Items Discharge Pain Level: 0 Discharge Condition: Stable Ambulatory Status:  Ambulatory Discharge Destination: Home Transportation: Private Auto Accompanied By: wife Schedule Follow-up Appointment: Yes Medication Reconciliation completed and provided to Patient/Care Yes Dametra Whetsel: Provided on Clinical Summary of Care: 01/31/2016 Form Type Recipient Paper Patient LB Electronic Signature(s) Signed: 01/31/2016 4:33:07 PM By: Elliot Gurney RN, BSN, Kim RN, BSN Previous Signature: 01/31/2016 1:42:22 PM Version By: Gwenlyn Perking Entered By: Elliot Gurney RN, BSN, Kim on 01/31/2016 13:59:16 [name] ([id number]) -------------------------------------------------------------------------------- Lower Extremity Assessment Details [name] Date of Service: 01/31/2016 12:45 PM Patient Name: C. Patient Account Number: [id number] Medical Record Treating RN: Huel Coventry [id number] Number: Other Clinician: Date of Birth/Sex: 04/29/1946 (70 y.o. Male) Treating [name], [name] Primary Care Physician: Physician/Extender: G Referring Physician: SELF, REFERRED Weeks in Treatment: 2 Vascular Assessment Pulses: Posterior Tibial Palpable: [Right:No] Doppler: [Right:Monophasic] Dorsalis Pedis Palpable: [Right:No] Doppler: [Right:Monophasic] Extremity colors, hair growth, and conditions: Extremity Color: [Right:Hyperpigmented] Hair Growth on Extremity: [Right:No] Temperature of Extremity: [Right:Warm] Capillary Refill: [Right:> 3 seconds] Toe Nail Assessment Left: Right: Thick: Yes Discolored: Yes Deformed: No Improper Length and Hygiene: No Electronic Signature(s) Signed: 01/31/2016 4:33:07 PM By: Elliot Gurney, RN, BSN, Kim RN, BSN Entered By: Elliot Gurney, RN, BSN, Kim on 01/31/2016 13:00:35 [name] ([id number]) -------------------------------------------------------------------------------- Multi Wound Chart Details [name] Date of Service: 01/31/2016 12:45 PM Patient Name: C. Patient Account Number: [id number] Medical Record Treating RN: Huel Coventry [id number] Number: Other Clinician: Date of Birth/Sex: [birth year] (70 y.o. 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] Comorbid History: [1:Cataracts, Chronic Obstructive Pulmonary Disease (COPD), Osteoarthritis] [2:Cataracts, Chronic Obstructive Pulmonary Disease (COPD), Osteoarthritis] [N/A:N/A] Date Acquired: [1:08/11/2015] [2:01/30/2016] [N/A:N/A] Weeks of Treatment: [1:2] [2:0] [N/A:N/A] Wound Status: [1:Open] [2:Open] [N/A:N/A] Clustered Wound: [  1:No] [2:Yes] [N/A:N/A] Measurements L x W x D 6x4x0.3 [2:4.2x3.5x0.1] [N/A:N/A] (cm) Area (cm) : [1:18.85] [2:11.545] [N/A:N/A] Volume (cm) : [1:5.655] [2:1.155] [N/A:N/A] % Reduction in Area: [1:-9.10%] [2:0.00%] [N/A:N/A] % Reduction in Volume: -9.10% [2:0.00%] [N/A:N/A] Classification: [1:Full Thickness Without Exposed Support Structures] [2:Partial Thickness] [N/A:N/A] Exudate Amount: [1:Medium] [2:Small] [N/A:N/A] Exudate Type: [1:Serous] [2:Serous] [N/A:N/A] Exudate Color: [1:amber] [2:amber] [N/A:N/A] Wound Margin: [1:Flat and Intact] [2:Indistinct, nonvisible] [N/A:N/A] Granulation Amount: [1:Small (1-33%)] [2:None Present (0%)] [N/A:N/A] Granulation Quality: [1:Pink] [2:N/A] [N/A:N/A] Necrotic Amount: [1:Medium (34-66%)] [2:Large (67-100%)] [N/A:N/A] Exposed Structures: Fascia: No Fascia: No N/A Fat: No Fat: No Tendon: No Tendon: No Muscle: No Muscle: No Joint: No Joint:  No Bone: No Bone: No Limited to Skin Limited to Skin Breakdown Breakdown Epithelialization: None None N/A Periwound Skin Texture: Scarring: Yes Scarring: Yes N/A Edema: No Edema: No Excoriation: No Excoriation: No Induration: No Induration: No Callus: No Callus: No Crepitus: No Crepitus: No Fluctuance: No Fluctuance: No Friable: No Friable: No Rash: No Rash: No Periwound Skin Moist: Yes Moist: Yes N/A Moisture: Maceration: No Maceration: No Dry/Scaly: No Dry/Scaly: No Periwound Skin Color: Hemosiderin Staining: Yes Atrophie Blanche: No N/A Atrophie Blanche: No Cyanosis: No Cyanosis: No Ecchymosis: No Ecchymosis: No Erythema: No Erythema: No Hemosiderin Staining: No Mottled: No Mottled: No Pallor: No Pallor: No Rubor: No Rubor: No Temperature: No Abnormality N/A N/A Tenderness on Yes No N/A Palpation: Wound Preparation: Ulcer Cleansing: Ulcer Cleansing: N/A Rinsed/Irrigated with Rinsed/Irrigated with Saline Saline Topical Anesthetic Topical Anesthetic Applied: Other: lidocaine Applied: Other: lidocaine 4% 4% Treatment Notes Electronic Signature(s) Signed: 01/31/2016 4:33:07 PM By: Elliot GurneyWoody, RN, BSN, Kim RN, BSN Entered By: Elliot GurneyWoody, RN, BSN, Kim on 01/31/2016 13:06:30 [name], Maceo C. ([id number]) -------------------------------------------------------------------------------- Multi-Disciplinary Care Plan Details [name], Jadarian Date of Service: 01/31/2016 12:45 PM Patient Name: C. Patient Account Number: [id number] Medical Record Treating RN: Huel CoventryWoody, Kim [id number] Number: Other Clinician: Date of Birth/Sex: 11-Jun-1946 [age] y.o. Male) Treating [name], [name] Primary Care Physician: Physician/Extender: G Referring Physician: SELF, REFERRED Weeks in Treatment: 2 Active Inactive Orientation to the Wound Care Program Nursing Diagnoses: Knowledge deficit related to the wound healing center program Goals: Patient/caregiver will verbalize understanding  of the Wound Healing Center Program Date Initiated: 01/17/2016 Goal Status: Active Interventions: Provide education on orientation to the wound center Notes: Wound/Skin Impairment Nursing Diagnoses: Impaired tissue integrity Goals: Ulcer/skin breakdown will heal within 14 weeks Date Initiated: 01/17/2016 Goal Status: Active Interventions: Assess ulceration(s) every visit Notes: Electronic Signature(s) Signed: 01/31/2016 4:33:07 PM By: Elliot GurneyWoody, RN, BSN, Kim RN, BSN Entered By: Elliot GurneyWoody, RN, BSN, Kim on 01/31/2016 13:06:24 [name], Rayquan C. ([id number]) -------------------------------------------------------------------------------- Pain Assessment Details [name], Xzavior Date of Service: 01/31/2016 12:45 PM Patient Name: C. Patient Account Number: [id number] Medical Record Treating RN: Huel CoventryWoody, Kim [id number] Number: Other Clinician: Date of Birth/Sex: 11-Jun-1946 [age] y.o. Male) Treating [name], [name] Primary Care Physician: Physician/Extender: G Referring Physician: SELF, REFERRED Weeks in Treatment: 2 Active Problems Location of Pain Severity and Description of Pain Patient Has Paino No Site Locations With Dressing Change: No Pain Management and Medication Current Pain Management: Electronic Signature(s) Signed: 01/31/2016 4:33:07 PM By: Elliot GurneyWoody, RN, BSN, Kim RN, BSN Entered By: Elliot GurneyWoody, RN, BSN, Kim on 01/31/2016 12:52:15 [name], Milo C. ([id number]) -------------------------------------------------------------------------------- Wound Assessment Details [name], Mclean Date of Service: 01/31/2016 12:45 PM Patient Name: C. Patient Account Number: [id number] Medical Record Treating RN: Huel CoventryWoody, Kim [id number] Number: Other Clinician: Date of Birth/Sex: 11-Jun-1946 [age] y.o. Male) Treating Baltazar NajjarOBSON, [name] Primary Care Physician: Physician/Extender: G Referring Physician: SELF, REFERRED Weeks in  Treatment: 2 Wound Status Wound Number: 1 Primary Venous Leg  Ulcer Etiology: Wound Location: Right Malleolus - Lateral Wound Open Wounding Event: Gradually Appeared Status: Date Acquired: 08/11/2015 Comorbid Cataracts, Chronic Obstructive Weeks Of Treatment: 2 History: Pulmonary Disease (COPD), Clustered Wound: No Osteoarthritis Photos Photo Uploaded By: Elliot Gurney, RN, BSN, Kim on 01/31/2016 15:59:56 Wound Measurements Length: (cm) 6 Width: (cm) 4 Depth: (cm) 0.3 Area: (cm) 18.85 Volume: (cm) 5.655 % Reduction in Area: -9.1% % Reduction in Volume: -9.1% Epithelialization: None Tunneling: No Undermining: No Wound Description Full Thickness Without Exposed Classification: Support Structures Wound Margin: Flat and Intact Exudate Medium Amount: Exudate Type: Serous Exudate Color: amber Foul Odor After Cleansing: No Wound Bed Console, Paul C. ([id number]) Granulation Amount: Small (1-33%) Exposed Structure Granulation Quality: Pink Fascia Exposed: No Necrotic Amount: Medium (34-66%) Fat Layer Exposed: No Necrotic Quality: Adherent Slough Tendon Exposed: No Muscle Exposed: No Joint Exposed: No Bone Exposed: No Limited to Skin Breakdown Periwound Skin Texture Texture Color No Abnormalities Noted: No No Abnormalities Noted: No Callus: No Atrophie Blanche: No Crepitus: No Cyanosis: No Excoriation: No Ecchymosis: No Fluctuance: No Erythema: No Friable: No Hemosiderin Staining: Yes Induration: No Mottled: No Localized Edema: No Pallor: No Rash: No Rubor: No Scarring: Yes Temperature / Pain Moisture Temperature: No Abnormality No Abnormalities Noted: No Tenderness on Palpation: Yes Dry / Scaly: No Maceration: No Moist: Yes Wound Preparation Ulcer Cleansing: Rinsed/Irrigated with Saline Topical Anesthetic Applied: Other: lidocaine 4%, Treatment Notes Wound #1 (Right, Lateral Malleolus) 1. Cleansed with: Clean wound with Normal Saline 2. Anesthetic Topical Lidocaine 4% cream to wound bed prior to  debridement 4. Dressing Applied: Iodoflex 5. Secondary Dressing Applied ABD Pad 7. Secured with Other (specify in notes) Notes Kerlix and Coban from base of toes to knee. FAOLAN, [place] ([id number]) Electronic Signature(s) Signed: 01/31/2016 4:33:07 PM By: Elliot Gurney, RN, BSN, Kim RN, BSN Entered By: Elliot Gurney, RN, BSN, Kim on 01/31/2016 12:57:32 [name] ([id number]) -------------------------------------------------------------------------------- Wound Assessment Details [name] Date of Service: 01/31/2016 12:45 PM Patient Name: C. Patient Account Number: [id number] Medical Record Treating RN: Huel Coventry [id number] Number: Other Clinician: Date of Birth/Sex: [birth year] (70 y.o. Male) Treating [name], [name] Primary Care Physician: Physician/Extender: G Referring Physician: SELF, REFERRED Weeks in Treatment: 2 Wound Status Wound Number: 2 Primary Arterial Insufficiency Ulcer Etiology: Wound Location: Right Malleolus - Medial Wound Open Wounding Event: Gradually Appeared Status: Date Acquired: 01/30/2016 Comorbid Cataracts, Chronic Obstructive Weeks Of Treatment: 0 History: Pulmonary Disease (COPD), Clustered Wound: Yes Osteoarthritis Photos Photo Uploaded By: Elliot Gurney, RN, BSN, Kim on 01/31/2016 15:59:56 Wound Measurements Length: (cm) 4.2 Width: (cm) 3.5 Depth: (cm) 0.1 Area: (cm) 11.545 Volume: (cm) 1.155 % Reduction in Area: 0% % Reduction in Volume: 0% Epithelialization: None Tunneling: No Undermining: No Wound Description Classification: Partial Thickness Wound Margin: Indistinct, nonvisible Exudate Amount: Small Exudate Type: Serous Exudate Color: amber Wound Bed Granulation Amount: None Present (0%) Exposed Structure Necrotic Amount: Large (67-100%) Fascia Exposed: No Sabina, Hyland C. ([id number]) Necrotic Quality: Adherent Slough Fat Layer Exposed: No Tendon Exposed: No Muscle Exposed: No Joint Exposed: No Bone Exposed:  No Limited to Skin Breakdown Periwound Skin Texture Texture Color No Abnormalities Noted: No No Abnormalities Noted: No Callus: No Atrophie Blanche: No Crepitus: No Cyanosis: No Excoriation: No Ecchymosis: No Fluctuance: No Erythema: No Friable: No Hemosiderin Staining: No Induration: No Mottled: No Localized Edema: No Pallor: No Rash: No Rubor: No Scarring: Yes Moisture No Abnormalities Noted: No Dry / Scaly: No Maceration: No Moist: Yes Wound Preparation  Ulcer Cleansing: Rinsed/Irrigated with Saline Topical Anesthetic Applied: Other: lidocaine 4%, Treatment Notes Wound #2 (Right, Medial Malleolus) 1. Cleansed with: Clean wound with Normal Saline 2. Anesthetic Topical Lidocaine 4% cream to wound bed prior to debridement 4. Dressing Applied: Aquacel Ag 5. Secondary Dressing Applied ABD Pad Electronic Signature(s) Signed: 01/31/2016 4:33:07 PM By: Elliot Gurney, RN, BSN, Kim RN, BSN Entered By: Elliot Gurney, RN, BSN, Kim on 01/31/2016 13:02:51 [name] ([id number]) -------------------------------------------------------------------------------- Vitals Details [name] Date of Service: 01/31/2016 12:45 PM Patient Name: C. Patient Account Number: [id number] Medical Record Treating RN: Huel Coventry [id number] Number: Other Clinician: Date of Birth/Sex: Mar 18, 1946 (70 y.o. Male) Treating [name], [name] Primary Care Physician: Physician/Extender: G Referring Physician: SELF, REFERRED Weeks in Treatment: 2 Vital Signs Time Taken: 12:50 Temperature (F): 98.0 Height (in): 76 Pulse (bpm): 78 Weight (lbs): 238 Respiratory Rate (breaths/min): 18 Body Mass Index (BMI): 29 Blood Pressure (mmHg): 123/62 Reference Range: 80 - 120 mg / dl Electronic Signature(s) Signed: 01/31/2016 4:33:07 PM By: Elliot Gurney, RN, BSN, Kim RN, BSN Entered By: Elliot Gurney, RN, BSN, Kim on 01/31/2016 12:53:10

## 2016-02-03 DIAGNOSIS — I87331 Chronic venous hypertension (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: Secondary | ICD-10-CM | POA: Diagnosis not present

## 2016-02-04 NOTE — Progress Notes (Signed)
[name], [facility] ([id number]) Visit Report for 02/03/2016 Arrival Information Details Patient Name: [name], [name] Date of Service: 02/03/2016 3:00 PM Medical Record Number: [id number] Patient Account Number: [id number] Date of Birth/Sex: [birth year](70 y.o. Male) Treating RN: Huel CoventryWoody, Kim Primary Care Physician: Other Clinician: Referring Physician: SELF, REFERRED Treating Physician/Extender: Rudene ReBritto, Errol Weeks in Treatment: 2 Visit Information History Since Last Visit Added or deleted any medications: No Patient Arrived: Ambulatory Any new allergies or adverse reactions: No Arrival Time: 14:59 Had a fall or experienced change in No Accompanied By: wife activities of daily living that may affect Transfer Assistance: None risk of falls: Patient Identification Verified: Yes Signs or symptoms of abuse/neglect since last No Secondary Verification Process Yes visito Completed: Hospitalized since last visit: No Patient Requires Transmission- No Has Dressing in Place as Prescribed: Yes Based Precautions: Pain Present Now: No Patient Has Alerts: Yes Patient Alerts: Patient on Blood Thinner Warfarin Electronic Signature(s) Signed: 02/03/2016 4:33:28 PM By: Elliot GurneyWoody, RN, BSN, Kim RN, BSN Entered By: Elliot GurneyWoody, RN, BSN, Kim on 02/03/2016 14:59:37 [name], [name] ([id number]) -------------------------------------------------------------------------------- Clinic Level of Care Assessment Details Patient Name: [name], [name] Date of Service: 02/03/2016 3:00 PM Medical Record Number: [id number] Patient Account Number: [id number] Date of Birth/Sex: [birth year](70 y.o. Male) Treating RN: Huel CoventryWoody, Kim Primary Care Physician: Other Clinician: Referring Physician: SELF, REFERRED Treating Physician/Extender: Rudene ReBritto, Errol Weeks in Treatment: 2 Clinic Level of Care Assessment Items TOOL 4 Quantity Score []  - Use when only an EandM is performed on FOLLOW-UP visit 0 ASSESSMENTS -  Nursing Assessment / Reassessment []  - Reassessment of Co-morbidities (includes updates in patient status) 0 []  - Reassessment of Adherence to Treatment Plan 0 ASSESSMENTS - Wound and Skin Assessment / Reassessment []  - Simple Wound Assessment / Reassessment - one wound 0 []  - Complex Wound Assessment / Reassessment - multiple wounds 0 []  - Dermatologic / Skin Assessment (not related to wound area) 0 ASSESSMENTS - Focused Assessment []  - Circumferential Edema Measurements - multi extremities 0 []  - Nutritional Assessment / Counseling / Intervention 0 []  - Lower Extremity Assessment (monofilament, tuning fork, pulses) 0 []  - Peripheral Arterial Disease Assessment (using hand held doppler) 0 ASSESSMENTS - Ostomy and/or Continence Assessment and Care []  - Incontinence Assessment and Management 0 []  - Ostomy Care Assessment and Management (repouching, etc.) 0 PROCESS - Coordination of Care X - Simple Patient / Family Education for ongoing care 1 15 []  - Complex (extensive) Patient / Family Education for ongoing care 0 []  - Staff obtains ChiropractorConsents, Records, Test Results / Process Orders 0 []  - Staff telephones HHA, Nursing Homes / Clarify orders / etc 0 []  - Routine Transfer to another Facility (non-emergent condition) 0 [name], Shihab C. ([id number]) []  - Routine Hospital Admission (non-emergent condition) 0 []  - New Admissions / [employer] engine[employer] Authorizations / Ordering NPWT, Apligraf, etc. 0 []  - Emergency Hospital Admission (emergent condition) 0 X - Simple Discharge Coordination 1 10 []  - Complex (extensive) Discharge Coordination 0 PROCESS - Special Needs []  - Pediatric / Minor Patient Management 0 []  - Isolation Patient Management 0 []  - Hearing / Language / Visual special needs 0 []  - Assessment of Community assistance (transportation, D/C planning, etc.) 0 []  - Additional assistance / Altered mentation 0 []  - Support Surface(s) Assessment (bed, cushion, seat, etc.) 0 INTERVENTIONS -  Wound Cleansing / Measurement X - Simple Wound Cleansing - one wound 1 5 []  - Complex Wound Cleansing - multiple wounds 0 X - Wound Imaging (photographs - any number  of wounds) 1 5 []  - Wound Tracing (instead of photographs) 0 X - Simple Wound Measurement - one wound 1 5 []  - Complex Wound Measurement - multiple wounds 0 INTERVENTIONS - Wound Dressings []  - Small Wound Dressing one or multiple wounds 0 X - Medium Wound Dressing one or multiple wounds 2 15 []  - Large Wound Dressing one or multiple wounds 0 []  - Application of Medications - topical 0 []  - Application of Medications - injection 0 INTERVENTIONS - Miscellaneous []  - External ear exam 0 Dougan, Kemontae C. ([id number]) []  - Specimen Collection (cultures, biopsies, blood, body fluids, etc.) 0 []  - Specimen(s) / Culture(s) sent or taken to Lab for analysis 0 []  - Patient Transfer (multiple staff / Michiel Sites Lift / Similar devices) 0 []  - Simple Staple / Suture removal (25 or less) 0 []  - Complex Staple / Suture removal (26 or more) 0 []  - Hypo / Hyperglycemic Management (close monitor of Blood Glucose) 0 []  - Ankle / Brachial Index (ABI) - do not check if billed separately 0 []  - Vital Signs 0 Has the patient been seen at the hospital within the last three years: Yes Total Score: 70 Level Of Care: New/Established - Level 2 Electronic Signature(s) Signed: 02/03/2016 4:33:28 PM By: Elliot Gurney, RN, BSN, Kim RN, BSN Entered By: Elliot Gurney, RN, BSN, Kim on 02/03/2016 16:29:34 [name] Stack ([id number]) -------------------------------------------------------------------------------- Encounter Discharge Information Details Patient Name: [name] Stack Date of Service: 02/03/2016 3:00 PM Medical Record Number: [id number] Patient Account Number: [id number] Date of Birth/Sex: 07/30/1946 (70 y.o. Male) Treating RN: Huel Coventry Primary Care Physician: Other Clinician: Referring Physician: SELF, REFERRED Treating Physician/Extender:  Rudene Re in Treatment: 2 Encounter Discharge Information Items Discharge Pain Level: 0 Discharge Condition: Stable Ambulatory Status: Ambulatory Discharge Destination: Home Private Transportation: Auto Accompanied By: wife Schedule Follow-up Appointment: Yes Medication Reconciliation completed and Yes provided to Patient/Care Jakyle Petrucelli: Clinical Summary of Care: Electronic Signature(s) Signed: 02/03/2016 4:33:28 PM By: Elliot Gurney, RN, BSN, Kim RN, BSN Entered By: Elliot Gurney, RN, BSN, Kim on 02/03/2016 08:65:78 [name] Stack ([id number]) -------------------------------------------------------------------------------- Patient/Caregiver Education Details Patient Name: [name] Stack Date of Service: 02/03/2016 3:00 PM Medical Record Number: [id number] Patient Account Number: [id number] Date of Birth/Gender: [birth year] (70 y.o. Male) Treating RN: Huel Coventry Primary Care Physician: Other Clinician: Referring Physician: SELF, REFERRED Treating Physician/Extender: Rudene Re in Treatment: 2 Education Assessment Education Provided To: Patient Education Topics Provided Venous: Handouts: Controlling Swelling with Multilayered Compression Wraps Methods: Demonstration, Explain/Verbal Responses: State content correctly Electronic Signature(s) Signed: 02/03/2016 4:33:28 PM By: Elliot Gurney, RN, BSN, Kim RN, BSN Entered By: Elliot Gurney, RN, BSN, Kim on 02/03/2016 16:28:26 [name] Stack ([id number]) -------------------------------------------------------------------------------- Wound Assessment Details Patient Name: [name] Stack Date of Service: 02/03/2016 3:00 PM Medical Record Number: [id number] Patient Account Number: [id number] Date of Birth/Sex: [birth year] (70 y.o. Male) Treating RN: Huel Coventry Primary Care Physician: Other Clinician: Referring Physician: SELF, REFERRED Treating Physician/Extender: Rudene Re in Treatment: 2 Wound Status Wound Number:  1 Primary Etiology: Venous Leg Ulcer Wound Location: Right, Lateral Malleolus Wound Status: Open Wounding Event: Gradually Appeared Date Acquired: 08/11/2015 Weeks Of Treatment: 2 Clustered Wound: No Photos Photo Uploaded By: Elliot Gurney, RN, BSN, Kim on 02/03/2016 15:26:22 Wound Measurements Length: (cm) 6.2 Width: (cm) 4 Depth: (cm) 0.3 Area: (cm) 19.478 Volume: (cm) 5.843 % Reduction in Area: -12.7% % Reduction in Volume: -12.7% Wound Description Full Thickness Without Exposed Classification: Support Structures Periwound Skin Texture Texture Color No Abnormalities Noted: No No Abnormalities Noted: No  Moisture No Abnormalities Noted: No Treatment Notes Wound #1 (Right, Lateral Malleolus) Hankinson, Gladys C. ([id number]) 1. Cleansed with: Clean wound with Normal Saline 2. Anesthetic Topical Lidocaine 4% cream to wound bed prior to debridement 4. Dressing Applied: Iodoflex 5. Secondary Dressing Applied ABD Pad 7. Secured with Other (specify in notes) Notes Kerlix and Coban from base of toes to knee. Electronic Signature(s) Signed: 02/03/2016 4:33:28 PM By: Elliot Gurney, RN, BSN, Kim RN, BSN Entered By: Elliot Gurney, RN, BSN, Kim on 02/03/2016 15:07:51 [name] Stack ([id number]) -------------------------------------------------------------------------------- Wound Assessment Details Patient Name: [name] Stack Date of Service: 02/03/2016 3:00 PM Medical Record Number: [id number] Patient Account Number: [id number] Date of Birth/Sex: 07/25/1946 (70 y.o. Male) Treating RN: Huel Coventry Primary Care Physician: Other Clinician: Referring Physician: SELF, REFERRED Treating Physician/Extender: Rudene Re in Treatment: 2 Wound Status Wound Number: 2 Primary Etiology: Arterial Insufficiency Ulcer Wound Location: Right, Medial Malleolus Wound Status: Open Wounding Event: Gradually Appeared Date Acquired: 01/30/2016 Weeks Of Treatment: 0 Clustered Wound:  Yes Photos Photo Uploaded By: Elliot Gurney, RN, BSN, Kim on 02/03/2016 15:26:23 Wound Measurements Length: (cm) 4.2 Width: (cm) 3.2 Depth: (cm) 0.1 Area: (cm) 10.556 Volume: (cm) 1.056 % Reduction in Area: 8.6% % Reduction in Volume: 8.6% Wound Description Classification: Partial Thickness Periwound Skin Texture Texture Color No Abnormalities Noted: No No Abnormalities Noted: No Moisture No Abnormalities Noted: No Treatment Notes Wound #2 (Right, Medial Malleolus) 1. Cleansed with: FLAY, GHOSH ([id number]) Clean wound with Normal Saline 2. Anesthetic Topical Lidocaine 4% cream to wound bed prior to debridement 4. Dressing Applied: Iodoflex 5. Secondary Dressing Applied ABD Pad 7. Secured with Other (specify in notes) Notes Kerlix and Coban from base of toes to knee. Electronic Signature(s) Signed: 02/03/2016 4:33:28 PM By: Elliot Gurney, RN, BSN, Kim RN, BSN Entered By: Elliot Gurney, RN, BSN, Kim on 02/03/2016 15:07:51

## 2016-02-07 ENCOUNTER — Ambulatory Visit: Payer: Medicare HMO | Admitting: Internal Medicine

## 2016-02-07 LAB — AEROBIC CULTURE  (SUPERFICIAL SPECIMEN)

## 2016-02-07 LAB — AEROBIC CULTURE W GRAM STAIN (SUPERFICIAL SPECIMEN): Gram Stain: NONE SEEN

## 2016-02-08 ENCOUNTER — Encounter: Payer: Medicare HMO | Admitting: Internal Medicine

## 2016-02-08 DIAGNOSIS — I87331 Chronic venous hypertension (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: Secondary | ICD-10-CM | POA: Diagnosis not present

## 2016-02-15 ENCOUNTER — Encounter: Payer: Medicare HMO | Admitting: Internal Medicine

## 2016-02-15 DIAGNOSIS — I87331 Chronic venous hypertension (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: Secondary | ICD-10-CM | POA: Diagnosis not present

## 2016-02-15 NOTE — Progress Notes (Addendum)
[name], [name] ([id number]) Visit Report for 02/15/2016 Arrival Information Details [name] Date of Service: 02/15/2016 9:15 AM Patient Name: C. Patient Account Number: [id number] Medical Record Treating RN: Clover Mealyfful, RN, BSN, [place] SinkRita [id number] Number: Other Clinician: Date of Birth/Sex: 1945/11/21 [age] y.o. Male) Treating [name], [name] Primary Care Physician: Physician/Extender: G Referring Physician: SELF, REFERRED Weeks in Treatment: 4 Visit Information History Since Last Visit Added or deleted any medications: No Patient Arrived: Ambulatory Any new allergies or adverse reactions: No Arrival Time: 09:05 Had a fall or experienced change in No Accompanied By: wife activities of daily living that may affect Transfer Assistance: None risk of falls: Patient Identification Verified: Yes Signs or symptoms of abuse/neglect since last No Secondary Verification Process Yes visito Completed: Hospitalized since last visit: No Patient Requires Transmission- No Has Dressing in Place as Prescribed: Yes Based Precautions: Pain Present Now: No Patient Has Alerts: Yes Patient Alerts: Patient on Blood Thinner Warfarin Electronic Signature(s) Signed: 02/15/2016 9:05:52 AM By: Elpidio EricAfful, Rita BSN, RN Entered By: Elpidio EricAfful, Rita on 02/15/2016 09:05:52 [name], [name] ([id number]) -------------------------------------------------------------------------------- Encounter Discharge Information Details [name] Date of Service: 02/15/2016 9:15 AM Patient Name: C. Patient Account Number: [id number] Medical Record Treating RN: Clover Mealyfful, RN, BSN, [place] SinkRita [id number] Number: Other Clinician: Date of Birth/Sex: 1945/11/21 [age] y.o. Male) Treating [name], [name] Primary Care Physician: Physician/Extender: G Referring Physician: SELF, REFERRED Weeks in Treatment: 4 Encounter Discharge Information Items Discharge Pain Level: 0 Discharge Condition: Stable Ambulatory Status:  Ambulatory Discharge Destination: Home Transportation: Private Auto Accompanied By: wife Schedule Follow-up Appointment: No Medication Reconciliation completed and provided to Patient/Care No [name]: Provided on Clinical Summary of Care: 02/15/2016 Form Type Recipient Paper Patient LB Electronic Signature(s) Signed: 02/15/2016 5:06:54 PM By: Elpidio EricAfful, Rita BSN, RN Previous Signature: 02/15/2016 9:47:56 AM Version By: Gwenlyn PerkingMoore, Shelia Entered By: Elpidio EricAfful, Rita on 02/15/2016 17:06:54 [name], [name] ([id number]) -------------------------------------------------------------------------------- Lower Extremity Assessment Details [name] Date of Service: 02/15/2016 9:15 AM Patient Name: C. Patient Account Number: [id number] Medical Record Treating RN: Clover Mealyfful, RN, BSN, [place] SinkRita [id number] Number: Other Clinician: Date of Birth/Sex: 1945/11/21 [age] y.o. Male) Treating [name], [name] Primary Care Physician: Physician/Extender: G Referring Physician: SELF, REFERRED Weeks in Treatment: 4 Vascular Assessment Pulses: Posterior Tibial Dorsalis Pedis Palpable: [Right:Yes] Extremity colors, hair growth, and conditions: Extremity Color: [Right:Hyperpigmented] Hair Growth on Extremity: [Right:Yes] Temperature of Extremity: [Right:Warm] Capillary Refill: [Right:< 3 seconds] Toe Nail Assessment Left: Right: Thick: Yes Discolored: Yes Deformed: No Improper Length and Hygiene: No Electronic Signature(s) Signed: 02/15/2016 9:06:29 AM By: Elpidio EricAfful, Rita BSN, RN Entered By: Elpidio EricAfful, Rita on 02/15/2016 09:06:29 [name], [name] ([id number]) -------------------------------------------------------------------------------- Multi Wound Chart Details [name] Date of Service: 02/15/2016 9:15 AM Patient Name: C. Patient Account Number: [id number] Medical Record Treating RN: Clover Mealyfful, RN, BSN,  SinkRita [id number] Number: Other Clinician: Date of Birth/Sex: 1945/11/21 [age] y.o. Male) Treating  [name], [name] Primary Care Physician: Physician/Extender: G Referring Physician: SELF, REFERRED Weeks in Treatment: 4 Vital Signs Height(in): 76 Pulse(bpm): 86 Weight(lbs): 238 Blood Pressure 141/63 (mmHg): Body Mass Index(BMI): 29 Temperature(F): 98.7 Respiratory Rate 18 (breaths/min): Photos: [1:No Photos] [2:No Photos] [N/A:N/A] Wound Location: [1:Right Malleolus - Lateral] [2:Right Malleolus - Medial N/A] Wounding Event: [1:Gradually Appeared] [2:Gradually Appeared] [N/A:N/A] Primary Etiology: [1:Venous Leg Ulcer] [2:Arterial Insufficiency Ulcer N/A] Comorbid History: [1:Cataracts, Chronic Obstructive Pulmonary Disease (COPD), Osteoarthritis] [2:Cataracts, Chronic Obstructive Pulmonary Disease (COPD), Osteoarthritis] [N/A:N/A] Date Acquired: [1:08/11/2015] [2:01/30/2016] [N/A:N/A] Weeks of Treatment: [1:4] [2:2] [N/A:N/A] Wound Status: [1:Open] [2:Open] [N/A:N/A] Clustered Wound: [1:No] [2:Yes] [N/A:N/A] Measurements L x W x D 9x5x0.3 [  2:5.5x2x0.1] [N/A:N/A] (cm) Area (cm) : [1:35.343] [2:8.639] [N/A:N/A] Volume (cm) : [1:10.603] [2:0.864] [N/A:N/A] % Reduction in Area: [1:-104.50%] [2:25.20%] [N/A:N/A] % Reduction in Volume: -104.50% [2:25.20%] [N/A:N/A] Classification: [1:Full Thickness Without Exposed Support Structures] [2:Partial Thickness] [N/A:N/A] Exudate Amount: [1:Large] [2:Large] [N/A:N/A] Exudate Type: [1:Serous] [2:Serous] [N/A:N/A] Exudate Color: [1:amber] [2:amber] [N/A:N/A] Foul Odor After [1:Yes] [2:Yes] [N/A:N/A] Cleansing: Odor Anticipated Due to No [2:No] [N/A:N/A] Product Use: [name], [name]. ([id number]) Wound Margin: Distinct, outline attached Indistinct, nonvisible N/A Granulation Amount: Small (1-33%) Medium (34-66%) N/A Granulation Quality: N/A Pink, Pale N/A Necrotic Amount: Large (67-100%) Medium (34-66%) N/A Exposed Structures: Fascia: No Fascia: No N/A Fat: No Fat: No Tendon: No Tendon: No Muscle: No Muscle: No Joint:  No Joint: No Bone: No Bone: No Limited to Skin Limited to Skin Breakdown Breakdown Epithelialization: None Small (1-33%) N/A Periwound Skin Texture: Scarring: Yes Edema: No N/A Edema: No Excoriation: No Excoriation: No Induration: No Induration: No Callus: No Callus: No Crepitus: No Crepitus: No Fluctuance: No Fluctuance: No Friable: No Friable: No Rash: No Rash: No Scarring: No Periwound Skin Maceration: Yes Moist: Yes N/A Moisture: Moist: Yes Maceration: No Dry/Scaly: No Dry/Scaly: No Periwound Skin Color: Hemosiderin Staining: Yes Hemosiderin Staining: Yes N/A Atrophie Blanche: No Atrophie Blanche: No Cyanosis: No Cyanosis: No Ecchymosis: No Ecchymosis: No Erythema: No Erythema: No Mottled: No Mottled: No Pallor: No Pallor: No Rubor: No Rubor: No Temperature: No Abnormality No Abnormality N/A Tenderness on No No N/A Palpation: Wound Preparation: Ulcer Cleansing: Ulcer Cleansing: N/A Rinsed/Irrigated with Rinsed/Irrigated with Saline Saline Topical Anesthetic Topical Anesthetic Applied: Other: lidocaine Applied: Other: lidocaine 4% 4% Treatment Notes Electronic Signature(s) Signed: 02/15/2016 4:58:21 PM By: [name] BSN, RN Entered By: [name] on 02/15/2016 09:22:37 [name], [name] ([id number]) [name], [name] ([id number]) -------------------------------------------------------------------------------- Multi-Disciplinary Care Plan Details [name] Ringer Date of Service: 02/15/2016 9:15 AM Patient Name: C. Patient Account Number: [id number] Medical Record Treating RN: [name] RN, BSN, [place] Sink [id number] Number: Other Clinician: Date of Birth/Sex: 05/24/1946 (70 y.o. Male) Treating [name], [name] Primary Care Physician: Physician/Extender: G Referring Physician: SELF, REFERRED Weeks in Treatment: 4 Active Inactive Orientation to the Wound Care Program Nursing Diagnoses: Knowledge deficit related to the wound healing center  program Goals: Patient/caregiver will verbalize understanding of the Wound Healing Center Program Date Initiated: 01/17/2016 Goal Status: Active Interventions: Provide education on orientation to the wound center Notes: Wound/Skin Impairment Nursing Diagnoses: Impaired tissue integrity Goals: Ulcer/skin breakdown will heal within 14 weeks Date Initiated: 01/17/2016 Goal Status: Active Interventions: Assess ulceration(s) every visit Notes: Electronic Signature(s) Signed: 02/15/2016 4:58:21 PM By: [name] BSN, RN Entered By: [name] on 02/15/2016 09:22:31 [name] ([id number]) -------------------------------------------------------------------------------- Pain Assessment Details [name] Ringer Date of Service: 02/15/2016 9:15 AM Patient Name: C. Patient Account Number: [id number] Medical Record Treating RN: [name] RN, BSN, [place] Sink [id number] Number: Other Clinician: Date of Birth/Sex: [birth year] (70 y.o. Male) Treating [name], [name] Primary Care Physician: Physician/Extender: G Referring Physician: SELF, REFERRED Weeks in Treatment: 4 Active Problems Location of Pain Severity and Description of Pain Patient Has Paino No Site Locations With Dressing Change: No Pain Management and Medication Current Pain Management: Electronic Signature(s) Signed: 02/15/2016 9:06:02 AM By: [name] BSN, RN Entered By: [name] on 02/15/2016 09:06:02 [name] ([id number]) -------------------------------------------------------------------------------- Patient/Caregiver Education Details [name] Ringer Date of Service: 02/15/2016 9:15 AM Patient Name: C. Patient Account Number: [id number] Medical Record Treating RN: [name] RN, BSN,  Sink [id number] Number: Other Clinician: Date of Birth/Gender: [birth year] (70 y.o. Male) Treating [name], [name] Primary  Care Physician: Physician/Extender: G Referring Physician: SELF, REFERRED Weeks in Treatment:  4 Education Assessment Education Provided To: Patient Education Topics Provided Basic Hygiene: Methods: Explain/Verbal Responses: State content correctly Welcome To The Wound Care Center: Methods: Demonstration Responses: State content correctly Wound/Skin Impairment: Methods: Explain/Verbal Responses: State content correctly Electronic Signature(s) Signed: 02/15/2016 5:07:19 PM By: [name] BSN, RN Entered By: [name] on 02/15/2016 17:07:18 [name] ([id number]) -------------------------------------------------------------------------------- Wound Assessment Details [name] Ringer Date of Service: 02/15/2016 9:15 AM Patient Name: C. Patient Account Number: [id number] Medical Record Treating RN: [name], RN, BSN, [place] Sink [id number] Number: Other Clinician: Date of Birth/Sex: 1945/11/21 (70 y.o. Male) Treating [name], [name] Primary Care Physician: Physician/Extender: G Referring Physician: SELF, REFERRED Weeks in Treatment: 4 Wound Status Wound Number: 1 Primary Venous Leg Ulcer Etiology: Wound Location: Right Malleolus - Lateral Wound Open Wounding Event: Gradually Appeared Status: Date Acquired: 08/11/2015 Comorbid Cataracts, Chronic Obstructive Weeks Of Treatment: 4 History: Pulmonary Disease (COPD), Clustered Wound: No Osteoarthritis Photos Photo Uploaded By: [name] on 02/15/2016 16:53:47 Wound Measurements Length: (cm) 9 Width: (cm) 5 Depth: (cm) 0.3 Area: (cm) 35.343 Volume: (cm) 10.603 % Reduction in Area: -104.5% % Reduction in Volume: -104.5% Epithelialization: None Tunneling: No Undermining: No Wound Description Full Thickness Without Exposed Classification: Support Structures [name], [name] ([id number]) Foul Odor After Cleansing: Yes Due to Product Use: No Wound Margin: Distinct, outline attached Exudate Large Amount: Exudate Type: Serous Exudate Color: amber Wound Bed Granulation Amount: Small (1-33%)  Exposed Structure Necrotic Amount: Large (67-100%) Fascia Exposed: No Necrotic Quality: Adherent Slough Fat Layer Exposed: No Tendon Exposed: No Muscle Exposed: No Joint Exposed: No Bone Exposed: No Limited to Skin Breakdown Periwound Skin Texture Texture Color No Abnormalities Noted: No No Abnormalities Noted: No Callus: No Atrophie Blanche: No Crepitus: No Cyanosis: No Excoriation: No Ecchymosis: No Fluctuance: No Erythema: No Friable: No Hemosiderin Staining: Yes Induration: No Mottled: No Localized Edema: No Pallor: No Rash: No Rubor: No Scarring: Yes Temperature / Pain Moisture Temperature: No Abnormality No Abnormalities Noted: No Dry / Scaly: No Maceration: Yes Moist: Yes Wound Preparation Ulcer Cleansing: Rinsed/Irrigated with Saline Topical Anesthetic Applied: Other: lidocaine 4%, Treatment Notes Wound #1 (Right, Lateral Malleolus) 1. Cleansed with: Cleanse wound with antibacterial soap and water 2. Anesthetic Topical Lidocaine 4% cream to wound bed prior to debridement 3. Peri-wound Care: Barrier cream [name], [name]. ([id number]) 4. Dressing Applied: Iodoflex 5. Secondary Dressing Applied ABD Pad Notes abd, kerlix and coban from base of toes to knee. Electronic Signature(s) Signed: 02/15/2016 4:58:21 PM By: [name] BSN, RN Entered By: [name] on 02/15/2016 09:21:52 [name] ([id number]) -------------------------------------------------------------------------------- Wound Assessment Details [name] Ringer Date of Service: 02/15/2016 9:15 AM Patient Name: C. Patient Account Number: [id number] Medical Record Treating RN: [name], RN, BSN,  Sink [id number] Number: Other Clinician: Date of Birth/Sex: 03-20-1946 (70 y.o. Male) Treating [name], [name] Primary Care Physician: Physician/Extender: G Referring Physician: SELF, REFERRED Weeks in Treatment: 4 Wound Status Wound Number: 2 Primary Arterial Insufficiency  Ulcer Etiology: Wound Location: Right Malleolus - Medial Wound Open Wounding Event: Gradually Appeared Status: Date Acquired: 01/30/2016 Comorbid Cataracts, Chronic Obstructive Weeks Of Treatment: 2 History: Pulmonary Disease (COPD), Clustered Wound: Yes Osteoarthritis Photos Photo Uploaded By: [name] on 02/15/2016 16:54:03 Wound Measurements Length: (cm) 5.5 Width: (cm) 2 Depth: (cm) 0.1 Area: (cm) 8.639 Volume: (cm) 0.864 % Reduction in Area: 25.2% % Reduction in Volume: 25.2% Epithelialization: Small (1-33%) Tunneling: No Undermining: No Wound Description Classification: Partial Thickness Wound Margin: Indistinct, nonvisible Gerstner,  [name] ([id number]) Foul Odor After Cleansing: Yes Due to Product Use: No Exudate Amount: Large Exudate Type: Serous Exudate Color: amber Wound Bed Granulation Amount: Medium (34-66%) Exposed Structure Granulation Quality: Pink, Pale Fascia Exposed: No Necrotic Amount: Medium (34-66%) Fat Layer Exposed: No Necrotic Quality: Adherent Slough Tendon Exposed: No Muscle Exposed: No Joint Exposed: No Bone Exposed: No Limited to Skin Breakdown Periwound Skin Texture Texture Color No Abnormalities Noted: No No Abnormalities Noted: No Callus: No Atrophie Blanche: No Crepitus: No Cyanosis: No Excoriation: No Ecchymosis: No Fluctuance: No Erythema: No Friable: No Hemosiderin Staining: Yes Induration: No Mottled: No Localized Edema: No Pallor: No Rash: No Rubor: No Scarring: No Temperature / Pain Moisture Temperature: No Abnormality No Abnormalities Noted: No Dry / Scaly: No Maceration: No Moist: Yes Wound Preparation Ulcer Cleansing: Rinsed/Irrigated with Saline Topical Anesthetic Applied: Other: lidocaine 4%, Treatment Notes Wound #2 (Right, Medial Malleolus) 1. Cleansed with: Cleanse wound with antibacterial soap and water 2. Anesthetic Topical Lidocaine 4% cream to wound bed prior to debridement 3.  Peri-wound Care: Barrier cream 4. Dressing Applied: Iodoflex Lynk, Heinrich C. ([id number]) 5. Secondary Dressing Applied ABD Pad Notes abd, kerlix and coban from base of toes to knee. Electronic Signature(s) Signed: 02/15/2016 4:58:21 PM By: [name] BSN, RN Entered By: [name] on 02/15/2016 09:22:21 [name] ([id number]) -------------------------------------------------------------------------------- Vitals Details [name] Ringer Date of Service: 02/15/2016 9:15 AM Patient Name: C. Patient Account Number: [id number] Medical Record Treating RN: [name], RN, BSN, [place] Sink [id number] Number: Other Clinician: Date of Birth/Sex: 1945-12-08 (70 y.o. Male) Treating [name], [name] Primary Care Physician: Physician/Extender: G Referring Physician: SELF, REFERRED Weeks in Treatment: 4 Vital Signs Time Taken: 09:11 Temperature (F): 98.7 Height (in): 76 Pulse (bpm): 86 Weight (lbs): 238 Respiratory Rate (breaths/min): 18 Body Mass Index (BMI): 29 Blood Pressure (mmHg): 141/63 Reference Range: 80 - 120 mg / dl Electronic Signature(s) Signed: 02/15/2016 4:58:21 PM By: [name] BSN, RN Entered By: [name] on 02/15/2016 09:11:51
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
## 2016-02-17 DIAGNOSIS — I87331 Chronic venous hypertension (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: Secondary | ICD-10-CM | POA: Diagnosis not present

## 2016-02-17 NOTE — Progress Notes (Signed)
[name], [name] ([id number]) Visit Report for 02/17/2016 Arrival Information Details Patient Name: [name], [name]. Date of Service: 02/17/2016 10:00 AM Medical Record Number: [id number] Patient Account Number: [id number] Date of Birth/Sex: 06/04/1946 (70 y.o. Male) Treating RN: [name] Primary Care Physician: Other Clinician: Referring Physician: SELF, [name] Treating Physician/Extender: [name] in Treatment: 4 Visit Information History Since Last Visit All ordered tests and consults were completed: No Patient Arrived: Ambulatory Added or deleted any medications: No Arrival Time: 10:02 Any new allergies or adverse reactions: No Accompanied By: wife Had a fall or experienced change in No Transfer Assistance: None activities of daily living that may affect Patient Identification Verified: Yes risk of falls: Secondary Verification Process Yes Signs or symptoms of abuse/neglect since last No Completed: visito Patient Requires Transmission- No Hospitalized since last visit: No Based Precautions: Has Dressing in Place as Prescribed: Yes Patient Has Alerts: Yes Pain Present Now: No Patient Alerts: Patient on Blood Thinner Electronic Signature(s) Signed: 02/17/2016 4:06:39 PM By: [name] Starch, RN, [name] Entered By: [name] Starch RN, [name] on 02/17/2016 10:03:14 [name] ([id number]) -------------------------------------------------------------------------------- Clinic Level of Care Assessment Details Patient Name: [name] Date of Service: 02/17/2016 10:00 AM Medical Record Number: [id number] Patient Account Number: [id number] Date of Birth/Sex: [birth year] (70 y.o. Male) Treating RN: [name] Primary Care Physician: Other Clinician: Referring Physician: SELF, [name] Treating Physician/Extender: [name] in Treatment: 4 Clinic Level of Care Assessment Items TOOL 4 Quantity Score X - Use when only an EandM is  performed on FOLLOW-UP visit 1 0 ASSESSMENTS - Nursing Assessment / Reassessment X - Reassessment of Co-morbidities (includes updates in patient status) 1 10 X - Reassessment of Adherence to Treatment Plan 1 5 ASSESSMENTS - Wound and Skin Assessment / Reassessment  - Simple Wound Assessment / Reassessment - one wound 0 X - Complex Wound Assessment / Reassessment - multiple wounds 2 5  - Dermatologic / Skin Assessment (not related to wound area) 0 ASSESSMENTS - Focused Assessment  - Circumferential Edema Measurements - multi extremities 0  - Nutritional Assessment / Counseling / Intervention 0  - Lower Extremity Assessment (monofilament, tuning fork, pulses) 0  - Peripheral Arterial Disease Assessment (using hand held doppler) 0 ASSESSMENTS - Ostomy and/or Continence Assessment and Care  - Incontinence Assessment and Management 0  - Ostomy Care Assessment and Management (repouching, etc.) 0 PROCESS - Coordination of Care X - Simple Patient / Family Education for ongoing care 1 15  - Complex (extensive) Patient / Family Education for ongoing care 0 X - Staff obtains Chiropractor, Records, Test Results / Process Orders 1 10  - Staff telephones HHA, Nursing Homes / Clarify orders / etc 0  - Routine Transfer to another Facility (non-emergent condition) 0 [name], [name] ([id number])  - Routine Hospital Admission (non-emergent condition) 0  - New Admissions / Manufacturing engineer / Ordering NPWT, Apligraf, etc. 0  - Emergency Hospital Admission (emergent condition) 0 X - Simple Discharge Coordination 1 10  - Complex (extensive) Discharge Coordination 0 PROCESS - Special Needs  - Pediatric / Minor Patient Management 0  - Isolation Patient Management 0  - Hearing / Language / Visual special needs 0  - Assessment of Community assistance (transportation, D/C planning, etc.) 0  - Additional assistance / Altered mentation 0  - Support Surface(s)  Assessment (bed, cushion, seat, etc.) 0 INTERVENTIONS - Wound Cleansing / Measurement  - Simple Wound Cleansing - one wound 0 X - Complex Wound Cleansing - multiple wounds 2 5  X - Wound Imaging (photographs - any number of wounds) 1 5 []  - Wound Tracing (instead of photographs) 0 []  - Simple Wound Measurement - one wound 0 X - Complex Wound Measurement - multiple wounds 2 5 INTERVENTIONS - Wound Dressings []  - Small Wound Dressing one or multiple wounds 0 []  - Medium Wound Dressing one or multiple wounds 0 X - Large Wound Dressing one or multiple wounds 1 20 []  - Application of Medications - topical 0 []  - Application of Medications - injection 0 INTERVENTIONS - Miscellaneous []  - External ear exam 0 [name], Srihaan C. ([id number]) []  - Specimen Collection (cultures, biopsies, blood, body fluids, etc.) 0 []  - Specimen(s) / Culture(s) sent or taken to Lab for analysis 0 []  - Patient Transfer (multiple staff / Michiel SitesHoyer Lift / Similar devices) 0 []  - Simple Staple / Suture removal (25 or less) 0 []  - Complex Staple / Suture removal (26 or more) 0 []  - Hypo / Hyperglycemic Management (close monitor of Blood Glucose) 0 []  - Ankle / Brachial Index (ABI) - do not check if billed separately 0 []  - Vital Signs 0 Has the patient been seen at the hospital within the last three years: Yes Total Score: 105 Level Of Care: New/Established - Level 3 Electronic Signature(s) Signed: 02/17/2016 4:06:39 PM By: [name] [place], RN, [name] Entered By: [name] [place], RN, [name] on 02/17/2016 10:32:32 [name] StackBLACKWELL, [name] ([id number]) -------------------------------------------------------------------------------- Encounter Discharge Information Details Patient Name: [name] StackBLACKWELL, [name] Date of Service: 02/17/2016 10:00 AM Medical Record Number: [id number] Patient Account Number: [id number] Date of Birth/Sex: Aug 05, 1946 [age] y.o. Male) Treating RN: [name] [place], [name] Primary Care Physician: Other Clinician: Referring  Physician: SELF, [name] Treating Physician/Extender: [name] ReBritto, Errol Weeks in Treatment: 4 Encounter Discharge Information Items Discharge Pain Level: 0 Discharge Condition: Stable Ambulatory Status: Ambulatory Discharge Destination: Home Private Transportation: Auto Schedule Follow-up Appointment: No Medication Reconciliation completed and No provided to Patient/Care [name] Lipsky: Clinical Summary of Care: Electronic Signature(s) Signed: 02/17/2016 4:06:39 PM By: [name] [place], RN, [name] Entered By: [name] [place], RN, [name] on 02/17/2016 10:31:53 [name] StackBLACKWELL, Saige C. ([id number]) -------------------------------------------------------------------------------- Patient/Caregiver Education Details Patient Name: [name] StackBLACKWELL, [name] Date of Service: 02/17/2016 10:00 AM Medical Record Number: [id number] Patient Account Number: [id number] Date of Birth/Gender: Aug 05, 1946 [age] y.o. Male) Treating RN: [name] [place], [name] Primary Care Physician: Other Clinician: Referring Physician: SELF, [name] Treating Physician/Extender: [name] ReBritto, Errol Weeks in Treatment: 4 Education Assessment Education Provided To: Patient Education Topics Provided Wound/Skin Impairment: Handouts: Caring for Your Ulcer, Skin Care Do's and Dont's Methods: Explain/Verbal Responses: State content correctly Electronic Signature(s) Signed: 02/17/2016 4:06:39 PM By: [name] [place], RN, [name] Entered By: [name] [place], RN, [name] on 02/17/2016 10:31:34 [name] StackBLACKWELL, [name] ([id number]) -------------------------------------------------------------------------------- Wound Assessment Details Patient Name: [name] StackBLACKWELL, Masson C. Date of Service: 02/17/2016 10:00 AM Medical Record Number: [id number] Patient Account Number: [id number] Date of Birth/Sex: Aug 05, 1946 [age] y.o. Male) Treating RN: [name] [place], [name] Primary Care Physician: Other Clinician: Referring Physician: SELF, [name] Treating Physician/Extender: [name] ReBritto, Errol Weeks in Treatment:  4 Wound Status Wound Number: 1 Primary Venous Leg Ulcer Etiology: Wound Location: Right Malleolus - Lateral Wound Open Wounding Event: Gradually Appeared Status: Date Acquired: 08/11/2015 Comorbid Cataracts, Chronic Obstructive Weeks Of Treatment: 4 History: Pulmonary Disease (COPD), Clustered Wound: No Osteoarthritis Wound Measurements Length: (cm) 9 Width: (cm) 5 Depth: (cm) 0.3 Area: (cm) 35.343 Volume: (cm) 10.603 % Reduction in Area: -104.5% % Reduction in Volume: -104.5% Epithelialization: None Wound Description Full Thickness Without Exposed Foul Odor Afte Classification: Support Structures Due to Product Wound Margin: Distinct, outline attached Exudate Large Amount: Exudate Type:  Serous Exudate Color: amber r Cleansing: Yes Use: No Wound Bed Granulation Amount: Small (1-33%) Exposed Structure Necrotic Amount: Large (67-100%) Fascia Exposed: No Necrotic Quality: Adherent Slough Fat Layer Exposed: No Tendon Exposed: No Muscle Exposed: No Joint Exposed: No Bone Exposed: No Limited to Skin Breakdown Periwound Skin Texture Texture Color No Abnormalities Noted: No No Abnormalities Noted: No Callus: No Atrophie Blanche: No [name] StackBLACKWELL, [name] ([id number]) Crepitus: No Cyanosis: No Excoriation: No Ecchymosis: No Fluctuance: No Erythema: No Friable: No Hemosiderin Staining: Yes Induration: No Mottled: No Localized Edema: No Pallor: No Rash: No Rubor: No Scarring: Yes Temperature / Pain Moisture Temperature: No Abnormality No Abnormalities Noted: No Dry / Scaly: No Maceration: Yes Moist: Yes Wound Preparation Ulcer Cleansing: Rinsed/Irrigated with Saline Treatment Notes Wound #1 (Right, Lateral Malleolus) 1. Cleansed with: Clean wound with Normal Saline Cleanse wound with antibacterial soap and water 3. Peri-wound Care: Barrier cream 4. Dressing Applied: Iodoflex 5. Secondary Dressing Applied ABD Pad Dry Gauze 7. Secured  with Other (specify in notes) Notes kerlix and Theatre managercoban Electronic Signature(s) Signed: 02/17/2016 4:06:39 PM By: [name] [place], RN, [name] Entered By: [name] [place], RN, [name] on 02/17/2016 10:30:38 [name] StackBLACKWELL, [name] ([id number]) -------------------------------------------------------------------------------- Wound Assessment Details Patient Name: [name] StackBLACKWELL, [name] Date of Service: 02/17/2016 10:00 AM Medical Record Number: [id number] Patient Account Number: [id number] Date of Birth/Sex: 02-Jun-1946 [age] y.o. Male) Treating RN: [name] [place], [name] Primary Care Physician: Other Clinician: Referring Physician: SELF, [name] Treating Physician/Extender: [name] ReBritto, Errol Weeks in Treatment: 4 Wound Status Wound Number: 2 Primary Arterial Insufficiency Ulcer Etiology: Wound Location: Right Malleolus - Medial Wound Open Wounding Event: Gradually Appeared Status: Date Acquired: 01/30/2016 Comorbid Cataracts, Chronic Obstructive Weeks Of Treatment: 2 History: Pulmonary Disease (COPD), Clustered Wound: Yes Osteoarthritis Wound Measurements Length: (cm) 5.5 Width: (cm) 5 Depth: (cm) 0.1 Area: (cm) 21.598 Volume: (cm) 2.16 % Reduction in Area: -87.1% % Reduction in Volume: -87% Epithelialization: Small (1-33%) Wound Description Classification: Partial Thickness Wound Margin: Indistinct, nonvisible Exudate Amount: Large Exudate Type: Serous Exudate Color: amber Foul Odor After Cleansing: Yes Due to Product Use: No Wound Bed Granulation Amount: Medium (34-66%) Exposed Structure Granulation Quality: Pink, Pale Fascia Exposed: No Necrotic Amount: Medium (34-66%) Fat Layer Exposed: No Necrotic Quality: Adherent Slough Tendon Exposed: No Muscle Exposed: No Joint Exposed: No Bone Exposed: No Limited to Skin Breakdown Periwound Skin Texture Texture Color No Abnormalities Noted: No No Abnormalities Noted: No Callus: No Atrophie Blanche: No Crepitus: No Cyanosis: No Excoriation:  No Ecchymosis: No [name], Kenon C. ([id number]) Fluctuance: No Erythema: No Friable: No Hemosiderin Staining: Yes Induration: No Mottled: No Localized Edema: No Pallor: No Rash: No Rubor: No Scarring: No Temperature / Pain Moisture Temperature: No Abnormality No Abnormalities Noted: No Dry / Scaly: No Maceration: No Moist: Yes Wound Preparation Ulcer Cleansing: Rinsed/Irrigated with Saline Treatment Notes Wound #2 (Right, Medial Malleolus) 1. Cleansed with: Clean wound with Normal Saline Cleanse wound with antibacterial soap and water 3. Peri-wound Care: Barrier cream 4. Dressing Applied: Iodoflex 5. Secondary Dressing Applied ABD Pad Dry Gauze 7. Secured with Other (specify in notes) Notes kerlix and Theatre managercoban Electronic Signature(s) Signed: 02/17/2016 4:06:39 PM By: [name] [place], RN, [name] Entered By: [name] Starch[place], RN, [name] on 02/17/2016 10:30:44

## 2016-02-22 ENCOUNTER — Encounter: Payer: Medicare HMO | Admitting: Internal Medicine

## 2016-02-22 DIAGNOSIS — I87331 Chronic venous hypertension (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: Secondary | ICD-10-CM | POA: Diagnosis not present
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
## 2016-02-23 NOTE — Progress Notes (Signed)
[name] [name] ([id number]) Visit Report for 02/22/2016 Arrival Information Details [name] [name] Date of Service: 02/22/2016 8:00 AM Patient Name: [name] [name]. Patient Account Number: [id number] Medical Record Treating RN: [name] Mealy RN, BSN, [name] [name] [id number] Number: Other Clinician: Date of Birth/Sex: 11/09/1945 (70 y.o. Male) Treating [name] [name] Primary Care [name]: [name]/Extender: [name] [name]: [name] [name] Weeks in Treatment: 5 Visit Information History Since Last Visit Added or deleted any medications: No Patient Arrived: Ambulatory Any new allergies or adverse reactions: No Arrival Time: 08:01 Had a fall or experienced change in No Accompanied By: self activities of daily living that may affect Transfer Assistance: None risk of falls: Patient Identification Verified: Yes Signs or symptoms of abuse/neglect since last No Secondary Verification Process Yes visito Completed: Hospitalized since last visit: No Patient Requires Transmission- No Has Dressing in Place as Prescribed: Yes Based Precautions: Has Compression in Place as Prescribed: Yes Patient Has Alerts: Yes Pain Present Now: No Patient Alerts: Patient on Blood Thinner Electronic Signature(s) Signed: 02/22/2016 5:43:53 PM By: [name] [name] BSN, RN Entered By: [name] [name] on 02/22/2016 08:04:40 [name] [name] ([id number]) -------------------------------------------------------------------------------- Encounter Discharge Information Details [name] [name] Date of Service: 02/22/2016 8:00 AM Patient Name: [name] [name]. Patient Account Number: [id number] Medical Record Treating RN: [name] Mealy RN, BSN, [name] [name] [id number] Number: Other Clinician: Date of Birth/Sex: 09-10-1945 (70 y.o. Male) Treating [name] [name] Primary Care [name]: [name]/Extender: [name] [name]: [name] [name] Weeks in Treatment: 5 Encounter Discharge Information Items Discharge Pain Level: 0 Discharge Condition:  Stable Ambulatory Status: Ambulatory Discharge Destination: Home Transportation: Private Auto Accompanied By: self Schedule Follow-up Appointment: No Medication Reconciliation completed and provided to Patient/Care No [name] [name]: Provided on Clinical Summary of Care: 02/22/2016 Form Type Recipient Paper Patient LB Electronic Signature(s) Signed: 02/22/2016 5:43:53 PM By: [name] [name] BSN, RN Previous Signature: 02/22/2016 8:34:11 AM Version By: Gwenlyn Perking Entered By: [name] [name] on 02/22/2016 08:38:03 [name] [name] ([id number]) -------------------------------------------------------------------------------- Lower Extremity Assessment Details [name] [name] Date of Service: 02/22/2016 8:00 AM Patient Name: [name] [name]. Patient Account Number: [id number] Medical Record Treating RN: [name] Mealy RN, BSN, [place] [name] [id number] Number: Other Clinician: Date of Birth/Sex: 23-Mar-1946 (70 y.o. Male) Treating [name] [name] Primary Care [name]: [name]/Extender: [name] [name]: [name] [name] Weeks in Treatment: 5 Vascular Assessment Pulses: Posterior Tibial Dorsalis Pedis Palpable: [Right:Yes] Extremity colors, hair growth, and conditions: Extremity Color: [Right:Mottled] Hair Growth on Extremity: [Right:Yes] Temperature of Extremity: [Right:Warm] Capillary Refill: [Right:< 3 seconds] Toe Nail Assessment Left: Right: Thick: Yes Discolored: Yes Deformed: No Improper Length and Hygiene: No Electronic Signature(s) Signed: 02/22/2016 5:43:53 PM By: [name] [name] BSN, RN Entered By: [name] [name] on 02/22/2016 08:05:08 [name] [name] ([id number]) -------------------------------------------------------------------------------- Multi Wound Chart Details [name] [name] Date of Service: 02/22/2016 8:00 AM Patient Name: [name] [name]. Patient Account Number: [id number] Medical Record Treating RN: [name] Mealy RN, BSN, [place] [name] [id number] Number: Other Clinician: Date of Birth/Sex: March 15, 1946 (70 y.o.  Male) Treating [name] [name] Primary Care [name]: [name]/Extender: [name] [name]: [name] [name] Weeks in Treatment: 5 Vital Signs Height(in): 76 Pulse(bpm): 84 Weight(lbs): 238 Blood Pressure 149/58 (mmHg): Body Mass Index(BMI): 29 Temperature(F): 97.8 Respiratory Rate 18 (breaths/min): Photos: [1:No Photos] [2:No Photos] [N/A:N/A] Wound Location: [1:Right Malleolus - Lateral] [2:Right Malleolus - Medial N/A] Wounding Event: [1:Gradually Appeared] [2:Gradually Appeared] [N/A:N/A] Primary Etiology: [1:Venous Leg Ulcer] [2:Arterial Insufficiency Ulcer N/A] Comorbid History: [1:Cataracts, Chronic Obstructive Pulmonary Disease (COPD), Osteoarthritis] [2:Cataracts, Chronic Obstructive Pulmonary Disease (COPD), Osteoarthritis] [N/A:N/A] Date Acquired: [1:08/11/2015] [2:01/30/2016] [N/A:N/A] Weeks of Treatment: [1:5] [2:3] [N/A:N/A] Wound Status: [1:Open] [2:Open] [N/A:N/A] Clustered Wound: [1:No] [2:Yes] [N/A:N/A] Measurements  L x W x D 8.5x6.5x0.3 [2:5.5x2.5x0.2] [N/A:N/A] (cm) Area (cm) : [1:43.393] [2:10.799] [N/A:N/A] Volume (cm) : [1:13.018] [2:2.16] [N/A:N/A] % Reduction in Area: [1:-151.10%] [2:6.50%] [N/A:N/A] % Reduction in Volume: -151.10% [2:-87.00%] [N/A:N/A] Classification: [1:Full Thickness Without Exposed Support Structures] [2:Full Thickness Without Exposed Support Structures] [N/A:N/A] Exudate Amount: [1:Large] [2:Large] [N/A:N/A] Exudate Type: [1:Serosanguineous] [2:Serosanguineous] [N/A:N/A] Exudate Color: [1:red, brown] [2:red, brown] [N/A:N/A] Foul Odor After [1:Yes] [2:Yes] [N/A:N/A] Cleansing: Odor Anticipated Due to No [2:No] [N/A:N/A] Product Use: [name], [name]. ([id number]) Wound Margin: Distinct, outline attached Indistinct, nonvisible N/A Granulation Amount: Small (1-33%) Small (1-33%) N/A Granulation Quality: N/A Pink, Pale N/A Necrotic Amount: Large (67-100%) Large (67-100%) N/A Exposed Structures: Fascia: No Fascia: No  N/A Fat: No Fat: No Tendon: No Tendon: No Muscle: No Muscle: No Joint: No Joint: No Bone: No Bone: No Limited to Skin Limited to Skin Breakdown Breakdown Epithelialization: None Small (1-33%) N/A Periwound Skin Texture: Scarring: Yes Edema: No N/A Edema: No Excoriation: No Excoriation: No Induration: No Induration: No Callus: No Callus: No Crepitus: No Crepitus: No Fluctuance: No Fluctuance: No Friable: No Friable: No Rash: No Rash: No Scarring: No Periwound Skin Maceration: Yes Moist: Yes N/A Moisture: Moist: Yes Maceration: No Dry/Scaly: No Dry/Scaly: No Periwound Skin Color: Hemosiderin Staining: Yes Hemosiderin Staining: Yes N/A Atrophie Blanche: No Atrophie Blanche: No Cyanosis: No Cyanosis: No Ecchymosis: No Ecchymosis: No Erythema: No Erythema: No Mottled: No Mottled: No Pallor: No Pallor: No Rubor: No Rubor: No Temperature: No Abnormality No Abnormality N/A Tenderness on No No N/A Palpation: Wound Preparation: Ulcer Cleansing: Ulcer Cleansing: N/A Rinsed/Irrigated with Rinsed/Irrigated with Saline, Other: SUrg Scrub Saline, Other: SUrg Scrub and water with water Topical Anesthetic Topical Anesthetic Applied: Other: Lidocaine Applied: Other: Lidocaine 4% 4% Treatment Notes Electronic Signature(s) Signed: 02/22/2016 5:43:53 PM By: [name] [name] BSN, RN [name] [name], [name] [name] ([id number]) Entered By: [name] [name] on 02/22/2016 08:20:31 [name] [name] ([id number]) -------------------------------------------------------------------------------- Multi-Disciplinary Care Plan Details [name] [name] Date of Service: 02/22/2016 8:00 AM Patient Name: [name] [name]. Patient Account Number: [id number] Medical Record Treating RN: [name] Mealy RN, BSN, [place] [name] [id number] Number: Other Clinician: Date of Birth/Sex: March 26, 1946 (70 y.o. Male) Treating [name] [name] Primary Care [name]: [name]/Extender: [name] [name]: [name] [name] Weeks in Treatment:  5 Active Inactive Orientation to the Wound Care Program Nursing Diagnoses: Knowledge deficit related to the wound healing center program Goals: Patient/caregiver will verbalize understanding of the Wound Healing Center Program Date Initiated: 01/17/2016 Goal Status: Active Interventions: Provide education on orientation to the wound center Notes: Wound/Skin Impairment Nursing Diagnoses: Impaired tissue integrity Goals: Ulcer/skin breakdown will heal within 14 weeks Date Initiated: 01/17/2016 Goal Status: Active Interventions: Assess ulceration(s) every visit Notes: Electronic Signature(s) Signed: 02/22/2016 5:43:53 PM By: [name] [name] BSN, RN Entered By: [name] [name] on 02/22/2016 08:19:16 [name] [name] ([id number]) -------------------------------------------------------------------------------- Pain Assessment Details [name] [name] Date of Service: 02/22/2016 8:00 AM Patient Name: [name] [name]. Patient Account Number: [id number] Medical Record Treating RN: [name] Mealy RN, BSN, [place] [name] [id number] Number: Other Clinician: Date of Birth/Sex: 1946/05/09 (70 y.o. Male) Treating [name] [name] Primary Care [name]: [name]/Extender: [name] [name]: [name] [name] Weeks in Treatment: 5 Active Problems Location of Pain Severity and Description of Pain Patient Has Paino No Site Locations With Dressing Change: No Pain Management and Medication Current Pain Management: Electronic Signature(s) Signed: 02/22/2016 5:43:53 PM By: [name] [name] BSN, RN Entered By: [name] [name] on 02/22/2016 08:04:48 [name] [name] ([id number]) -------------------------------------------------------------------------------- Patient/Caregiver Education Details [name] [name] Date of Service: 02/22/2016 8:00 AM Patient Name: [name] [name]. Patient Account Number: [id number] Medical  Record Treating RN: [name] Mealyfful, RN, BSN, [place] SinkRita [id number] Number: Other Clinician: Date of Birth/Gender: August 24, 1946 [age] y.o. Male)  Treating [name] [name] Primary Care [name]: [name]/Extender: [name] [name]: [name] [name] Weeks in Treatment: 5 Education Assessment Education Provided To: Patient Education Topics Provided Welcome To The Wound Care Center: Methods: Explain/Verbal Responses: State content correctly Wound/Skin Impairment: Methods: Explain/Verbal Responses: State content correctly Electronic Signature(s) Signed: 02/22/2016 5:43:53 PM By: [name] EricAfful, Rita BSN, RN Entered By: [name] EricAfful, Rita on 02/22/2016 08:38:15 [name] StackBLACKWELL, [place] [name] [name]. ([id number]) -------------------------------------------------------------------------------- Wound Assessment Details [name] RingerBLACKWELL, [name] [name] Date of Service: 02/22/2016 8:00 AM Patient Name: [name] [name]. Patient Account Number: [id number] Medical Record Treating RN: [name] Mealyfful, RN, BSN, [place] SinkRita [id number] Number: Other Clinician: Date of Birth/Sex: August 24, 1946 [age] y.o. Male) Treating [name] [name] Primary Care [name]: [name]/Extender: [name] [name]: [name] [name] Weeks in Treatment: 5 Wound Status Wound Number: 1 Primary Venous Leg Ulcer Etiology: Wound Location: Right Malleolus - Lateral Wound Open Wounding Event: Gradually Appeared Status: Date Acquired: 08/11/2015 Comorbid Cataracts, Chronic Obstructive Weeks Of Treatment: 5 History: Pulmonary Disease (COPD), Clustered Wound: No Osteoarthritis Photos Photo Uploaded By: [name] EricAfful, Rita on 02/22/2016 17:27:11 Wound Measurements Length: (cm) 8.5 Width: (cm) 6.5 Depth: (cm) 0.3 Area: (cm) 43.393 Volume: (cm) 13.018 % Reduction in Area: -151.1% % Reduction in Volume: -151.1% Epithelialization: None Tunneling: No Undermining: No Wound Description Full Thickness Without Exposed Classification: Support Structures Wound Margin: Distinct, outline attached Exudate Large Amount: Exudate Type: Serosanguineous Exudate Color: red, brown Foul Odor After Cleansing: Yes Due to Product Use: No Wound  Bed [name] [name], [name] [name] [name]. ([id number]) Granulation Amount: Small (1-33%) Exposed Structure Necrotic Amount: Large (67-100%) Fascia Exposed: No Necrotic Quality: Adherent Slough Fat Layer Exposed: No Tendon Exposed: No Muscle Exposed: No Joint Exposed: No Bone Exposed: No Limited to Skin Breakdown Periwound Skin Texture Texture Color No Abnormalities Noted: No No Abnormalities Noted: No Callus: No Atrophie Blanche: No Crepitus: No Cyanosis: No Excoriation: No Ecchymosis: No Fluctuance: No Erythema: No Friable: No Hemosiderin Staining: Yes Induration: No Mottled: No Localized Edema: No Pallor: No Rash: No Rubor: No Scarring: Yes Temperature / Pain Moisture Temperature: No Abnormality No Abnormalities Noted: No Dry / Scaly: No Maceration: Yes Moist: Yes Wound Preparation Ulcer Cleansing: Rinsed/Irrigated with Saline, Other: SUrg Scrub and water, Topical Anesthetic Applied: Other: Lidocaine 4%, Treatment Notes Wound #1 (Right, Lateral Malleolus) 1. Cleansed with: Cleanse wound with antibacterial soap and water 3. Peri-wound Care: Barrier cream 4. Dressing Applied: Santyl Ointment 5. Secondary Dressing Applied ABD Pad Dry Gauze 7. Secured with Other (specify in notes) Notes drawtex, [name] [name], coban [name] [name], [name] [name] [name]. ([id number]) Electronic Signature(s) Signed: 02/22/2016 5:43:53 PM By: [name] EricAfful, Rita BSN, RN Entered By: [name] EricAfful, Rita on 02/22/2016 08:14:37 [name] StackBLACKWELL, [name] [name] [name]. ([id number]) -------------------------------------------------------------------------------- Wound Assessment Details [name] RingerBLACKWELL, [name] [name] Date of Service: 02/22/2016 8:00 AM Patient Name: [name] [name]. Patient Account Number: [id number] Medical Record Treating RN: [name] Mealyfful, RN, BSN, [place] SinkRita [id number] Number: Other Clinician: Date of Birth/Sex: August 24, 1946 [age] y.o. Male) Treating [name] [name] Primary Care [name]: [name]/Extender: [name] [name]: [name] [name] Weeks in Treatment: 5 Wound  Status Wound Number: 2 Primary Arterial Insufficiency Ulcer Etiology: Wound Location: Right Malleolus - Medial Wound Open Wounding Event: Gradually Appeared Status: Date Acquired: 01/30/2016 Comorbid Cataracts, Chronic Obstructive Weeks Of Treatment: 3 History: Pulmonary Disease (COPD), Clustered Wound: Yes Osteoarthritis Photos Photo Uploaded By: [name] EricAfful, Rita on 02/22/2016 17:27:11 Wound Measurements Length: (cm) 5.5 Width: (cm) 2.5 Depth: (cm) 0.2 Area: (cm) 10.799 Volume: (cm) 2.16 % Reduction in Area: 6.5% % Reduction in Volume: -87% Epithelialization: Small (1-33%) Tunneling: No Undermining:  No Wound Description Full Thickness Without Exposed Classification: Support Structures [name] StackBLACKWELL, [name] [name] [name]. ([id number]) Foul Odor After Cleansing: Yes Due to Product Use: No Wound Margin: Indistinct, nonvisible Exudate Large Amount: Exudate Type: Serosanguineous Exudate Color: red, brown Wound Bed Granulation Amount: Small (1-33%) Exposed Structure Granulation Quality: Pink, Pale Fascia Exposed: No Necrotic Amount: Large (67-100%) Fat Layer Exposed: No Necrotic Quality: Adherent Slough Tendon Exposed: No Muscle Exposed: No Joint Exposed: No Bone Exposed: No Limited to Skin Breakdown Periwound Skin Texture Texture Color No Abnormalities Noted: No No Abnormalities Noted: No Callus: No Atrophie Blanche: No Crepitus: No Cyanosis: No Excoriation: No Ecchymosis: No Fluctuance: No Erythema: No Friable: No Hemosiderin Staining: Yes Induration: No Mottled: No Localized Edema: No Pallor: No Rash: No Rubor: No Scarring: No Temperature / Pain Moisture Temperature: No Abnormality No Abnormalities Noted: No Dry / Scaly: No Maceration: No Moist: Yes Wound Preparation Ulcer Cleansing: Rinsed/Irrigated with Saline, Other: SUrg Scrub with water, Topical Anesthetic Applied: Other: Lidocaine 4%, Treatment Notes Wound #2 (Right, Medial Malleolus) 1. Cleansed  with: Cleanse wound with antibacterial soap and water 3. Peri-wound Care: Barrier cream 4. Dressing Applied: Santyl Ointment [name] StackBLACKWELL, [name] [name] [name]. ([id number]) 5. Secondary Dressing Applied ABD Pad Dry Gauze 7. Secured with Other (specify in notes) Notes drawtex, kerlix, coban Electronic Signature(s) Signed: 02/22/2016 5:43:53 PM By: [name] EricAfful, Rita BSN, RN Entered By: [name] EricAfful, Rita on 02/22/2016 08:15:20 [name] StackBLACKWELL, [name] [name] [name]. ([id number]) -------------------------------------------------------------------------------- Vitals Details [name] RingerBLACKWELL, [name] [name] Date of Service: 02/22/2016 8:00 AM Patient Name: [name] [name]. Patient Account Number: [id number] Medical Record Treating RN: [name] Mealyfful, RN, BSN, [place] SinkRita [id number] Number: Other Clinician: Date of Birth/Sex: [birth year] [age] y.o. Male) Treating [name] [name] Primary Care [name]: [name]/Extender: [name] [name]: [name] [name] Weeks in Treatment: 5 Vital Signs Time Taken: 08:05 Temperature (F): 97.8 Height (in): 76 Pulse (bpm): 84 Weight (lbs): 238 Respiratory Rate (breaths/min): 18 Body Mass Index (BMI): 29 Blood Pressure (mmHg): 149/58 Reference Range: 80 - 120 mg / dl Electronic Signature(s) Signed: 02/22/2016 5:43:53 PM By: [name] EricAfful, Rita BSN, RN Entered By: [name] EricAfful, Rita on 02/22/2016 08:05:29

## 2016-02-24 DIAGNOSIS — I87331 Chronic venous hypertension (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: Secondary | ICD-10-CM | POA: Diagnosis not present

## 2016-02-25 NOTE — Progress Notes (Signed)
[name], [name] ([id number]) Visit Report for 02/24/2016 Arrival Information Details Patient Name: [name], [name]. Date of Service: 02/24/2016 1:30 PM Medical Record Number: [id number] Patient Account Number: [id number] Date of Birth/Sex: [birth year] (70 y.o. Male) Treating RN: [name] Primary Care Physician: Other Clinician: Referring Physician: SELF, REFERRED Treating Physician/Extender: Rudene Re in Treatment: 5 Visit Information History Since Last Visit All ordered tests and consults were completed: No Patient Arrived: Ambulatory Added or deleted any medications: No Arrival Time: 13:32 Any new allergies or adverse reactions: No Accompanied By: self Had a fall or experienced change in No Transfer Assistance: None activities of daily living that may affect Patient Identification Verified: Yes risk of falls: Secondary Verification Process Yes Signs or symptoms of abuse/neglect since last No Completed: visito Patient Requires Transmission- No Hospitalized since last visit: No Based Precautions: Pain Present Now: No Patient Has Alerts: Yes Patient Alerts: Patient on Blood Thinner Electronic Signature(s) Signed: 02/24/2016 5:29:01 PM By: Alejandro Mulling Entered By: Alejandro Mulling on 02/24/2016 13:33:17 [name] ([id number]) -------------------------------------------------------------------------------- Clinic Level of Care Assessment Details Patient Name: [name] Date of Service: 02/24/2016 1:30 PM Medical Record Number: [id number] Patient Account Number: [id number] Date of Birth/Sex: June 21, 1946 (70 y.o. Male) Treating RN: [name] Primary Care Physician: Other Clinician: Referring Physician: SELF, REFERRED Treating Physician/Extender: Rudene Re in Treatment: 5 Clinic Level of Care Assessment Items TOOL 4 Quantity Score X - Use when only an EandM is performed on FOLLOW-UP visit 1 0 ASSESSMENTS -  Nursing Assessment / Reassessment X - Reassessment of Co-morbidities (includes updates in patient status) 1 10 X - Reassessment of Adherence to Treatment Plan 1 5 ASSESSMENTS - Wound and Skin Assessment / Reassessment X - Simple Wound Assessment / Reassessment - one wound 1 5  - Complex Wound Assessment / Reassessment - multiple wounds 0  - Dermatologic / Skin Assessment (not related to wound area) 0 ASSESSMENTS - Focused Assessment X - Circumferential Edema Measurements - multi extremities 1 5  - Nutritional Assessment / Counseling / Intervention 0  - Lower Extremity Assessment (monofilament, tuning fork, pulses) 0  - Peripheral Arterial Disease Assessment (using hand held doppler) 0 ASSESSMENTS - Ostomy and/or Continence Assessment and Care  - Incontinence Assessment and Management 0  - Ostomy Care Assessment and Management (repouching, etc.) 0 PROCESS - Coordination of Care X - Simple Patient / Family Education for ongoing care 1 15  - Complex (extensive) Patient / Family Education for ongoing care 0  - Staff obtains Chiropractor, Records, Test Results / Process Orders 0  - Staff telephones HHA, Nursing Homes / Clarify orders / etc 0  - Routine Transfer to another Facility (non-emergent condition) 0 [name], [name] ([id number])  - Routine Hospital Admission (non-emergent condition) 0  - New Admissions / Manufacturing engineer / Ordering NPWT, Apligraf, etc. 0  - Emergency Hospital Admission (emergent condition) 0 X - Simple Discharge Coordination 1 10  - Complex (extensive) Discharge Coordination 0 PROCESS - Special Needs  - Pediatric / Minor Patient Management 0  - Isolation Patient Management 0  - Hearing / Language / Visual special needs 0  - Assessment of Community assistance (transportation, D/C planning, etc.) 0  - Additional assistance / Altered mentation 0  - Support Surface(s) Assessment (bed, cushion, seat, etc.)  0 INTERVENTIONS - Wound Cleansing / Measurement X - Simple Wound Cleansing - one wound 1 5  - Complex Wound Cleansing - multiple wounds 0 X - Wound Imaging (photographs - any number of  wounds) 1 5 []  - Wound Tracing (instead of photographs) 0 []  - Simple Wound Measurement - one wound 0 X - Complex Wound Measurement - multiple wounds 1 5 INTERVENTIONS - Wound Dressings []  - Small Wound Dressing one or multiple wounds 0 []  - Medium Wound Dressing one or multiple wounds 0 X - Large Wound Dressing one or multiple wounds 1 20 []  - Application of Medications - topical 0 []  - Application of Medications - injection 0 INTERVENTIONS - Miscellaneous []  - External ear exam 0 [name], [name] ([id number]) []  - Specimen Collection (cultures, biopsies, blood, body fluids, etc.) 0 []  - Specimen(s) / Culture(s) sent or taken to Lab for analysis 0 []  - Patient Transfer (multiple staff / Michiel SitesHoyer Lift / Similar devices) 0 []  - Simple Staple / Suture removal (25 or less) 0 []  - Complex Staple / Suture removal (26 or more) 0 []  - Hypo / Hyperglycemic Management (close monitor of Blood Glucose) 0 []  - Ankle / Brachial Index (ABI) - do not check if billed separately 0 []  - Vital Signs 0 Has the patient been seen at the hospital within the last three years: Yes Total Score: 85 Level Of Care: New/Established - Level 3 Electronic Signature(s) Signed: 02/24/2016 5:29:01 PM By: Alejandro MullingPinkerton, Debra Entered By: Alejandro MullingPinkerton, Debra on 02/24/2016 17:21:32 [name] StackBLACKWELL, [name] ([id number]) -------------------------------------------------------------------------------- Encounter Discharge Information Details Patient Name: [name] StackBLACKWELL, [name] Date of Service: 02/24/2016 1:30 PM Medical Record Number: [id number] Patient Account Number: [id number] Date of Birth/Sex: June 25, 1946 [age] y.o. Male) Treating RN: Phillis HaggisPinkerton, [name] Primary Care Physician: Other Clinician: Referring Physician: SELF, REFERRED Treating  Physician/Extender: Rudene ReBritto, Errol Weeks in Treatment: 5 Encounter Discharge Information Items Discharge Pain Level: 0 Discharge Condition: Stable Ambulatory Status: Ambulatory Discharge Destination: Home Private Transportation: Auto Accompanied By: wife Schedule Follow-up Appointment: Yes Medication Reconciliation completed and Yes provided to Patient/Care [place] Pickelsimer: Clinical Summary of Care: Electronic Signature(s) Signed: 02/24/2016 5:29:01 PM By: Alejandro MullingPinkerton, Debra Entered By: Alejandro MullingPinkerton, Debra on 02/24/2016 17:21:28 [name] StackBLACKWELL, [name] ([id number]) -------------------------------------------------------------------------------- Patient/Caregiver Education Details Patient Name: [name] StackBLACKWELL, [name] Date of Service: 02/24/2016 1:30 PM Medical Record Number: [id number] Patient Account Number: [id number] Date of Birth/Gender: June 25, 1946 [age] y.o. Male) Treating RN: Phillis HaggisPinkerton, [name] Primary Care Physician: Other Clinician: Referring Physician: SELF, REFERRED Treating Physician/Extender: Rudene ReBritto, Errol Weeks in Treatment: 5 Education Assessment Education Provided To: Patient Education Topics Provided Wound/Skin Impairment: Handouts: Other: do not get wrap wet Methods: Demonstration, Explain/Verbal Responses: State content correctly Electronic Signature(s) Signed: 02/24/2016 5:29:01 PM By: Alejandro MullingPinkerton, Debra Entered By: Alejandro MullingPinkerton, Debra on 02/24/2016 17:21:26 [name] StackBLACKWELL, Kollin C. ([id number]) -------------------------------------------------------------------------------- Wound Assessment Details Patient Name: [name] StackBLACKWELL, Tieler C. Date of Service: 02/24/2016 1:30 PM Medical Record Number: [id number] Patient Account Number: [id number] Date of Birth/Sex: June 25, 1946 [age] y.o. Male) Treating RN: Phillis HaggisPinkerton, [name] Primary Care Physician: Other Clinician: Referring Physician: SELF, REFERRED Treating Physician/Extender: Rudene ReBritto, Errol Weeks in Treatment: 5 Wound Status Wound Number: 1  Primary Venous Leg Ulcer Etiology: Wound Location: Right Malleolus - Lateral Wound Open Wounding Event: Gradually Appeared Status: Date Acquired: 08/11/2015 Comorbid Cataracts, Chronic Obstructive Weeks Of Treatment: 5 History: Pulmonary Disease (COPD), Clustered Wound: No Osteoarthritis Photos Wound Measurements Length: (cm) 8.5 Width: (cm) 6.5 Depth: (cm) 0.3 Area: (cm) 43.393 Volume: (cm) 13.018 % Reduction in Area: -151.1% % Reduction in Volume: -151.1% Epithelialization: None Tunneling: No Undermining: No Wound Description Full Thickness Without Exposed Classification: Support Structures Wound Margin: Distinct, outline attached Exudate Large Amount: Exudate Type: Serosanguineous Exudate Color: red, brown Foul Odor After Cleansing: Yes Due to Product Use: No  Wound Bed Granulation Amount: Small (1-33%) Exposed Structure Necrotic Amount: Large (67-100%) Fascia Exposed: No Necrotic Quality: Adherent Slough Fat Layer Exposed: No [name], [name] ([id number]) Tendon Exposed: No Muscle Exposed: No Joint Exposed: No Bone Exposed: No Limited to Skin Breakdown Periwound Skin Texture Texture Color No Abnormalities Noted: No No Abnormalities Noted: No Callus: No Atrophie Blanche: No Crepitus: No Cyanosis: No Excoriation: No Ecchymosis: No Fluctuance: No Erythema: No Friable: No Hemosiderin Staining: Yes Induration: No Mottled: No Localized Edema: No Pallor: No Rash: No Rubor: No Scarring: Yes Temperature / Pain Moisture Temperature: No Abnormality No Abnormalities Noted: No Dry / Scaly: No Maceration: Yes Moist: Yes Wound Preparation Ulcer Cleansing: Rinsed/Irrigated with Saline, Other: SUrg Scrub and water, Topical Anesthetic Applied: Other: Lidocaine 4%, Treatment Notes Wound #1 (Right, Lateral Malleolus) 1. Cleansed with: Cleanse wound with antibacterial soap and water 3. Peri-wound Care: Barrier cream 4. Dressing  Applied: Santyl Ointment 5. Secondary Dressing Applied ABD Pad Dry Gauze 7. Secured with Tape 2 Layer Lite Compression System - Right Lower Extremity Notes drawtex, kerlix, coban Electronic Signature(s) [name] StackBLACKWELL, [name] ([id number]) Signed: 02/24/2016 5:45:29 PM By: Alejandro MullingPinkerton, Debra Entered By: Alejandro MullingPinkerton, Debra on 02/24/2016 17:39:04 [name] StackBLACKWELL, [name] ([id number]) -------------------------------------------------------------------------------- Wound Assessment Details Patient Name: [name] StackBLACKWELL, [name] Date of Service: 02/24/2016 1:30 PM Medical Record Number: [id number] Patient Account Number: [id number] Date of Birth/Sex: April 08, 1946 [age] y.o. Male) Treating RN: Phillis HaggisPinkerton, [name] Primary Care Physician: Other Clinician: Referring Physician: SELF, REFERRED Treating Physician/Extender: Rudene ReBritto, Errol Weeks in Treatment: 5 Wound Status Wound Number: 2 Primary Arterial Insufficiency Ulcer Etiology: Wound Location: Right Malleolus - Medial Wound Open Wounding Event: Gradually Appeared Status: Date Acquired: 01/30/2016 Comorbid Cataracts, Chronic Obstructive Weeks Of Treatment: 3 History: Pulmonary Disease (COPD), Clustered Wound: Yes Osteoarthritis Photos Wound Measurements Length: (cm) 5.6 Width: (cm) 2 Depth: (cm) 0.3 Area: (cm) 8.796 Volume: (cm) 2.639 % Reduction in Area: 23.8% % Reduction in Volume: -128.5% Epithelialization: Small (1-33%) Tunneling: No Undermining: No Wound Description Full Thickness Without Exposed Classification: Support Structures Wound Margin: Indistinct, nonvisible Exudate Large Amount: Exudate Type: Serosanguineous Exudate Color: red, brown Foul Odor After Cleansing: Yes Due to Product Use: No Wound Bed Granulation Amount: Medium (34-66%) Exposed Structure Granulation Quality: Pink, Pale Fascia Exposed: No Necrotic Amount: Medium (34-66%) Fat Layer Exposed: No [name], Jerred C. ([id number]) Necrotic Quality: Adherent  Slough Tendon Exposed: No Muscle Exposed: No Joint Exposed: No Bone Exposed: No Limited to Skin Breakdown Periwound Skin Texture Texture Color No Abnormalities Noted: No No Abnormalities Noted: No Callus: No Atrophie Blanche: No Crepitus: No Cyanosis: No Excoriation: No Ecchymosis: No Fluctuance: No Erythema: No Friable: No Hemosiderin Staining: Yes Induration: No Mottled: No Localized Edema: No Pallor: No Rash: No Rubor: No Scarring: No Temperature / Pain Moisture Temperature: No Abnormality No Abnormalities Noted: No Dry / Scaly: No Maceration: No Moist: Yes Wound Preparation Ulcer Cleansing: Rinsed/Irrigated with Saline, Other: SUrg Scrub with water, Topical Anesthetic Applied: None Treatment Notes Wound #2 (Right, Medial Malleolus) 1. Cleansed with: Cleanse wound with antibacterial soap and water 3. Peri-wound Care: Barrier cream 4. Dressing Applied: Santyl Ointment 5. Secondary Dressing Applied ABD Pad Dry Gauze 7. Secured with Tape 2 Layer Lite Compression System - Right Lower Extremity Notes drawtex, kerlix, coban Electronic Signature(s) [name] StackBLACKWELL, [name] C. ([id number]) Signed: 02/24/2016 5:45:29 PM By: Alejandro MullingPinkerton, Debra Previous Signature: 02/24/2016 5:29:01 PM Version By: Alejandro MullingPinkerton, Debra Entered By: Alejandro MullingPinkerton, Debra on 02/24/2016 17:40:03

## 2016-02-29 ENCOUNTER — Encounter: Payer: Medicare HMO | Attending: Internal Medicine | Admitting: Internal Medicine

## 2016-02-29 DIAGNOSIS — Z86718 Personal history of other venous thrombosis and embolism: Secondary | ICD-10-CM | POA: Diagnosis not present

## 2016-02-29 DIAGNOSIS — I87331 Chronic venous hypertension (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: Secondary | ICD-10-CM | POA: Insufficient documentation

## 2016-02-29 DIAGNOSIS — L97313 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: Secondary | ICD-10-CM | POA: Diagnosis not present

## 2016-02-29 DIAGNOSIS — M1990 Unspecified osteoarthritis, unspecified site: Secondary | ICD-10-CM | POA: Diagnosis not present

## 2016-02-29 DIAGNOSIS — Z7901 Long term (current) use of anticoagulants: Secondary | ICD-10-CM | POA: Diagnosis not present

## 2016-02-29 DIAGNOSIS — T85613A 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, initial encounter: Secondary | ICD-10-CM | POA: Diagnosis not present

## 2016-02-29 DIAGNOSIS — H269 Unspecified cataract: Secondary | ICD-10-CM | POA: Insufficient documentation

## 2016-02-29 DIAGNOSIS — Y832 Surgical operation with anastomosis, bypass or graft as the cause of abnormal reaction of the patient, or of later complication, without mention of misadventure at the time of the procedure: Secondary | ICD-10-CM | POA: Diagnosis not present

## 2016-02-29 DIAGNOSIS — J449 Chronic obstructive pulmonary disease, unspecified: Secondary | ICD-10-CM | POA: Diagnosis not present

## 2016-03-01 NOTE — Progress Notes (Signed)
[name], [name] ([id number]) Visit Report for 02/08/2016 Arrival Information Details [name], [name] Date of Service: 02/08/2016 8:00 AM Patient Name: C. Patient Account Number: [id number] Medical Record Treating RN: Huel CoventryWoody, Kim [id number] Number: Other Clinician: Date of Birth/Sex: 01-01-1946 [age] y.o. Male) Treating [name], [name] Primary Care Physician: Physician/Extender: G Referring Physician: SELF, REFERRED Weeks in Treatment: 3 Visit Information History Since Last Visit Added or deleted any medications: No Patient Arrived: Ambulatory Any new allergies or adverse reactions: No Arrival Time: 08:01 Had a fall or experienced change in No Accompanied By: wife activities of daily living that may affect Transfer Assistance: None risk of falls: Patient Identification Verified: Yes Signs or symptoms of abuse/neglect since last No Secondary Verification Process Yes visito Completed: Hospitalized since last visit: No Patient Requires Transmission- No Has Dressing in Place as Prescribed: Yes Based Precautions: Has Compression in Place as Prescribed: Yes Patient Has Alerts: Yes Pain Present Now: No Patient Alerts: Patient on Blood Thinner Warfarin Electronic Signature(s) Signed: 02/09/2016 11:41:21 AM By: Elliot GurneyWoody, RN, BSN, Kim RN, BSN Entered By: Elliot GurneyWoody, RN, BSN, Kim on 02/08/2016 08:01:47 [name], [name] ([id number]) -------------------------------------------------------------------------------- Encounter Discharge Information Details [name], [name] Date of Service: 02/08/2016 8:00 AM Patient Name: C. Patient Account Number: [id number] Medical Record Treating RN: Huel CoventryWoody, Kim [id number] Number: Other Clinician: Date of Birth/Sex: 01-01-1946 [age] y.o. Male) Treating [name], [name] Primary Care Physician: Physician/Extender: G Referring Physician: SELF, REFERRED Weeks in Treatment: 3 Encounter Discharge Information Items Discharge Pain Level: 0 Discharge Condition:  Stable Ambulatory Status: Ambulatory Discharge Destination: Home Transportation: Private Auto Accompanied By: wife Schedule Follow-up Appointment: Yes Medication Reconciliation completed and provided to Patient/Care Yes Nelly Scriven: Provided on Clinical Summary of Care: 02/08/2016 Form Type Recipient Paper Patient LB Electronic Signature(s) Signed: 02/09/2016 11:41:21 AM By: Elliot GurneyWoody, RN, BSN, Kim RN, BSN Previous Signature: 02/08/2016 8:37:53 AM Version By: Gwenlyn PerkingMoore, Shelia Entered By: Elliot GurneyWoody, RN, BSN, Kim on 02/08/2016 08:39:36 [name], [name] ([id number]) -------------------------------------------------------------------------------- Lower Extremity Assessment Details [name], [name] Date of Service: 02/08/2016 8:00 AM Patient Name: C. Patient Account Number: [id number] Medical Record Treating RN: Huel CoventryWoody, Kim [id number] Number: Other Clinician: Date of Birth/Sex: 01-01-1946 [age] y.o. Male) Treating [name], [name] Primary Care Physician: Physician/Extender: G Referring Physician: SELF, REFERRED Weeks in Treatment: 3 Edema Assessment Assessed: [Left: No] [Right: No] Edema: [Left: N] [Right: o] Vascular Assessment Pulses: Posterior Tibial Dorsalis Pedis Palpable: [Right:Yes] Extremity colors, hair growth, and conditions: Extremity Color: [Right:Hyperpigmented] Hair Growth on Extremity: [Right:Yes] Temperature of Extremity: [Right:Warm] Capillary Refill: [Right:< 3 seconds] Toe Nail Assessment Left: Right: Thick: Yes Discolored: Yes Deformed: No Improper Length and Hygiene: No Electronic Signature(s) Signed: 02/09/2016 11:41:21 AM By: Elliot GurneyWoody, RN, BSN, Kim RN, BSN Entered By: Elliot GurneyWoody, RN, BSN, Kim on 02/08/2016 08:07:20 [name], [name] ([id number]) -------------------------------------------------------------------------------- Multi Wound Chart Details [name], [name] Date of Service: 02/08/2016 8:00 AM Patient Name: C. Patient Account Number: [id number] Medical  Record Treating RN: Huel CoventryWoody, Kim [id number] Number: Other Clinician: Date of Birth/Sex: 01-01-1946 (70 y.o. 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 Comorbid History: Cataracts, Chronic Cataracts, Chronic N/A Obstructive Pulmonary Obstructive Pulmonary Disease (COPD), Disease (COPD), Osteoarthritis Osteoarthritis Date Acquired: 08/11/2015 01/30/2016 N/A Weeks of Treatment: 3  1 N/A Wound Status: Open Open N/A Clustered Wound: No Yes N/A Measurements L x W x D 6.5x4x0.3 4.5x2x0.1 N/A (cm) Area (cm) : 20.42 7.069 N/A Volume (cm) : 6.126 0.707 N/A % Reduction in Area: -18.20% 38.80% N/A % Reduction in Volume: -18.20% 38.80% N/A Classification: Full Thickness Without Partial Thickness N/A Exposed Support Structures Exudate Amount: Large Small N/A [name], [name] ([id number]) Exudate Type: Serous Serous N/A Exudate Color: amber amber N/A Wound Margin: Distinct, outline attached Indistinct, nonvisible N/A Granulation Amount: None Present (0%) Large (67-100%) N/A Necrotic Amount: Large (67-100%) Small (1-33%) N/A Exposed Structures: Fascia: No Fascia: No N/A Fat: No Fat: No Tendon: No Tendon: No Muscle: No Muscle: No Joint: No Joint: No Bone: No Bone: No Limited to Skin Limited to Skin Breakdown Breakdown Epithelialization: None Small (1-33%) N/A Periwound Skin Texture: Scarring: Yes Edema: No N/A Edema: No Excoriation: No Excoriation: No Induration: No Induration: No Callus:  No Callus: No Crepitus: No Crepitus: No Fluctuance: No Fluctuance: No Friable: No Friable: No Rash: No Rash: No Scarring: No Periwound Skin Maceration: Yes Dry/Scaly: Yes N/A Moisture: Moist: Yes Maceration: No Dry/Scaly: No Moist: No Periwound Skin Color: Atrophie Blanche: No Hemosiderin Staining: Yes N/A Cyanosis: No Atrophie Blanche: No Ecchymosis: No Cyanosis: No Erythema: No Ecchymosis: No Hemosiderin Staining: No Erythema: No Mottled: No Mottled: No Pallor: No Pallor: No Rubor: No Rubor: No Tenderness on No No N/A Palpation: Wound Preparation: Ulcer Cleansing: Ulcer Cleansing: N/A Rinsed/Irrigated with Rinsed/Irrigated with Saline Saline Topical Anesthetic Topical Anesthetic Applied: Other: lidocaine Applied: Other: lidocaine 4% 4% Treatment Notes Electronic Signature(s) Signed: 02/09/2016 11:41:21 AM By: Elliot Gurney, RN, BSN, Kim RN, BSN Entered By: Elliot Gurney, RN, BSN, Kim on 02/08/2016 08:12:40 KYLLIAN, [name] ([id number]) [name], [name] ([id number]) -------------------------------------------------------------------------------- Multi-Disciplinary Care Plan Details [name] Ringer Date of Service: 02/08/2016 8:00 AM Patient Name: C. Patient Account Number: [id number] Medical Record Treating RN: Huel Coventry [id number] Number: Other Clinician: Date of Birth/Sex: 1945-11-20 (70 y.o. Male) Treating [name], [name] Primary Care Physician: Physician/Extender: G Referring Physician: SELF, REFERRED Weeks in Treatment: 3 Active Inactive Orientation to the Wound Care Program Nursing Diagnoses: Knowledge deficit related to the wound healing center program Goals: Patient/caregiver will verbalize understanding of the Wound Healing Center Program Date Initiated: 01/17/2016 Goal Status: Active Interventions: Provide education on orientation to the wound center Notes: Wound/Skin Impairment Nursing Diagnoses: Impaired tissue integrity Goals: Ulcer/skin  breakdown will heal within 14 weeks Date Initiated: 01/17/2016 Goal Status: Active Interventions: Assess ulceration(s) every visit Notes: Electronic Signature(s) Signed: 02/09/2016 11:41:21 AM By: Elliot Gurney, RN, BSN, Kim RN, BSN Entered By: Elliot Gurney, RN, BSN, Kim on 02/08/2016 08:12:35 [name] Stack ([id number]) -------------------------------------------------------------------------------- Pain Assessment Details [name] Ringer Date of Service: 02/08/2016 8:00 AM Patient Name: C. Patient Account Number: [id number] Medical Record Treating RN: Huel Coventry [id number] Number: Other Clinician: Date of Birth/Sex: 1945-12-11 (70 y.o. Male) Treating [name], [name] Primary Care Physician: Physician/Extender: G Referring Physician: SELF, REFERRED Weeks in Treatment: 3 Active Problems Location of Pain Severity and Description of Pain Patient Has Paino No Site Locations With Dressing Change: No Pain Management and Medication Current Pain Management: Electronic Signature(s) Signed: 02/09/2016 11:41:21 AM By: Elliot Gurney, RN, BSN, Kim RN, BSN Entered By: Elliot Gurney, RN, BSN, Kim on 02/08/2016 08:01:53 [name] Stack ([id number]) -------------------------------------------------------------------------------- Patient/Caregiver Education Details [name] Ringer Date of Service: 02/08/2016 8:00 AM Patient Name: C. Patient Account Number: [id number] Medical Record Treating RN: Huel Coventry [id number] Number: Other Clinician: Date of Birth/Gender: August 30, 1945 (70 y.o. Male) Treating [name] Primary Care Physician:  Physician/Extender: G Referring Physician: SELF, REFERRED Weeks in Treatment: 3 Education Assessment Education Provided To: Patient Education Topics Provided Venous: Wound/Skin Impairment: Handouts: Caring for Your Ulcer, Other: do not get dressing wet Methods: Demonstration, Printed Responses: State content correctly Electronic Signature(s) Signed: 02/09/2016 11:41:21 AM  By: Elliot GurneyWoody, RN, BSN, Kim RN, BSN Entered By: Elliot GurneyWoody, RN, BSN, Kim on 02/08/2016 08:40:02 [name], Enzio C. ([id number]) -------------------------------------------------------------------------------- Wound Assessment Details [name], [name] Date of Service: 02/08/2016 8:00 AM Patient Name: C. Patient Account Number: [id number] Medical Record Treating RN: Huel CoventryWoody, Kim [id number] Number: Other Clinician: Date of Birth/Sex: June 16, 1946 [age] y.o. Male) Treating [name], [name] Primary Care Physician: Physician/Extender: G Referring Physician: SELF, REFERRED Weeks in Treatment: 3 Wound Status Wound Number: 1 Primary Venous Leg Ulcer Etiology: Wound Location: Right Malleolus - Lateral Wound Open Wounding Event: Gradually Appeared Status: Date Acquired: 08/11/2015 Comorbid Cataracts, Chronic Obstructive Weeks Of Treatment: 3 History: Pulmonary Disease (COPD), Clustered Wound: No Osteoarthritis Photos Wound Measurements Length: (cm) 6.5 Width: (cm) 4 Depth: (cm) 0.3 Area: (cm) 20.42 Volume: (cm) 6.126 % Reduction in Area: -18.2% % Reduction in Volume: -18.2% Epithelialization: None Tunneling: No Undermining: No Wound Description Full Thickness Without Exposed Classification: Support Structures Wound Margin: Distinct, outline attached Exudate Large Amount: Exudate Type: Serous Exudate Color: amber Foul Odor After Cleansing: No Wound Bed Granulation Amount: None Present (0%) Exposed Structure [name], [name] ([id number]) Necrotic Amount: Large (67-100%) Fascia Exposed: No Necrotic Quality: Adherent Slough Fat Layer Exposed: No Tendon Exposed: No Muscle Exposed: No Joint Exposed: No Bone Exposed: No Limited to Skin Breakdown Periwound Skin Texture Texture Color No Abnormalities Noted: No No Abnormalities Noted: No Callus: No Atrophie Blanche: No Crepitus: No Cyanosis: No Excoriation: No Ecchymosis: No Fluctuance: No Erythema: No Friable:  No Hemosiderin Staining: No Induration: No Mottled: No Localized Edema: No Pallor: No Rash: No Rubor: No Scarring: Yes Moisture No Abnormalities Noted: No Dry / Scaly: No Maceration: Yes Moist: Yes Wound Preparation Ulcer Cleansing: Rinsed/Irrigated with Saline Topical Anesthetic Applied: Other: lidocaine 4%, Electronic Signature(s) Signed: 02/09/2016 11:41:21 AM By: Elliot GurneyWoody, RN, BSN, Kim RN, BSN Entered By: Elliot GurneyWoody, RN, BSN, Kim on 02/08/2016 08:11:42 [name], [name] ([id number]) -------------------------------------------------------------------------------- Wound Assessment Details [name], [name] Date of Service: 02/08/2016 8:00 AM Patient Name: C. Patient Account Number: [id number] Medical Record Treating RN: Huel CoventryWoody, Kim [id number] Number: Other Clinician: Date of Birth/Sex: June 16, 1946 [age] y.o. Male) Treating [name], [name] Primary Care Physician: Physician/Extender: G Referring Physician: SELF, REFERRED Weeks in Treatment: 3 Wound Status Wound Number: 2 Primary Arterial Insufficiency Ulcer Etiology: Wound Location: Right Malleolus - Medial Wound Open Wounding Event: Gradually Appeared Status: Date Acquired: 01/30/2016 Comorbid Cataracts, Chronic Obstructive Weeks Of Treatment: 1 History: Pulmonary Disease (COPD), Clustered Wound: Yes Osteoarthritis Photos Wound Measurements Length: (cm) 4.5 Width: (cm) 2 Depth: (cm) 0.1 Area: (cm) 7.069 Volume: (cm) 0.707 % Reduction in Area: 38.8% % Reduction in Volume: 38.8% Epithelialization: Small (1-33%) Tunneling: No Undermining: No Wound Description Classification: Partial Thickness Wound Margin: Indistinct, nonvisible Exudate Amount: Small Exudate Type: Serous Exudate Color: amber Wound Bed Granulation Amount: Large (67-100%) Exposed Structure Necrotic Amount: Small (1-33%) Fascia Exposed: No Necrotic Quality: Adherent Slough Fat Layer Exposed: No [name], [name] ([id number]) Tendon Exposed:  No Muscle Exposed: No Joint Exposed: No Bone Exposed: No Limited to Skin Breakdown Periwound Skin Texture Texture Color No Abnormalities Noted: No No Abnormalities Noted: No Callus: No Atrophie Blanche: No Crepitus: No Cyanosis: No Excoriation: No Ecchymosis: No Fluctuance: No Erythema: No Friable: No Hemosiderin Staining: Yes Induration: No Mottled: No  Localized Edema: No Pallor: No Rash: No Rubor: No Scarring: No Moisture No Abnormalities Noted: No Dry / Scaly: Yes Maceration: No Moist: No Wound Preparation Ulcer Cleansing: Rinsed/Irrigated with Saline Topical Anesthetic Applied: Other: lidocaine 4%, Electronic Signature(s) Signed: 02/09/2016 11:41:21 AM By: Elliot Gurney, RN, BSN, Kim RN, BSN Entered By: Elliot Gurney, RN, BSN, Kim on 02/08/2016 08:12:05 [name] Stack ([id number]) -------------------------------------------------------------------------------- Vitals Details [name] Ringer Date of Service: 02/08/2016 8:00 AM Patient Name: C. Patient Account Number: [id number] Medical Record Treating RN: Huel Coventry [id number] Number: Other Clinician: Date of Birth/Sex: 02/03/1946 (70 y.o. Male) Treating [name], [name] Primary Care Physician: Physician/Extender: G Referring Physician: SELF, REFERRED Weeks in Treatment: 3 Vital Signs Time Taken: 08:01 Temperature (F): 98.1 Height (in): 76 Pulse (bpm): 85 Weight (lbs): 238 Respiratory Rate (breaths/min): 18 Body Mass Index (BMI): 29 Blood Pressure (mmHg): 138/57 Reference Range: 80 - 120 mg / dl Electronic Signature(s) Signed: 02/09/2016 11:41:21 AM By: Elliot Gurney, RN, BSN, Kim RN, BSN Entered By: Elliot Gurney, RN, BSN, Kim on 02/08/2016 08:02:09
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
## 2016-03-01 NOTE — Progress Notes (Signed)
[name], Zackerie C. ([id number]) Visit Report for 02/29/2016 Arrival Information Details [name], Donis Date of Service: 02/29/2016 8:45 AM Patient Name: C. Patient Account Number: [id number] Medical Record Treating RN: Phillis Haggisinkerton, [name] [id number] Number: Other Clinician: Date of Birth/Sex: 1946-02-21 [age] y.o. Male) Treating [name], [name] Primary Care Physician: Physician/Extender: G Referring Physician: SELF, REFERRED Weeks in Treatment: 6 Visit Information History Since Last Visit All ordered tests and consults were completed: No Patient Arrived: Ambulatory Added or deleted any medications: No Arrival Time: 08:36 Any new allergies or adverse reactions: No Accompanied By: wife Had a fall or experienced change in No Transfer Assistance: None activities of daily living that may affect Patient Identification Verified: Yes risk of falls: Secondary Verification Process Yes Signs or symptoms of abuse/neglect since last No Completed: visito Patient Requires Transmission- No Hospitalized since last visit: No Based Precautions: Pain Present Now: No Patient Has Alerts: Yes Patient Alerts: Patient on Blood Thinner Electronic Signature(s) Signed: 02/29/2016 5:31:40 PM By: Alejandro MullingPinkerton, Debra Entered By: Alejandro MullingPinkerton, Debra on 02/29/2016 08:36:59 [name], [name] ([id number]) -------------------------------------------------------------------------------- Encounter Discharge Information Details [name], [name] Date of Service: 02/29/2016 8:45 AM Patient Name: C. Patient Account Number: [id number] Medical Record Treating RN: Phillis Haggisinkerton, [name] [id number] Number: Other Clinician: Date of Birth/Sex: 1946-02-21 [age] y.o. Male) Treating [name], [name] Primary Care Physician: Physician/Extender: G Referring Physician: SELF, REFERRED Weeks in Treatment: 6 Encounter Discharge Information Items Discharge Pain Level: 0 Discharge Condition: Stable Ambulatory Status: Ambulatory Discharge  Destination: Home Transportation: Private Auto Accompanied By: wife Schedule Follow-up Appointment: Yes Medication Reconciliation completed and provided to Patient/Care Yes Adwoa Axe: Provided on Clinical Summary of Care: 02/29/2016 Form Type Recipient Paper Patient LB Electronic Signature(s) Signed: 02/29/2016 9:47:29 AM By: Gwenlyn PerkingMoore, Shelia Entered By: Gwenlyn PerkingMoore, Shelia on 02/29/2016 09:47:29 [name], [name] ([id number]) -------------------------------------------------------------------------------- Lower Extremity Assessment Details [name], Pieter Date of Service: 02/29/2016 8:45 AM Patient Name: C. Patient Account Number: [id number] Medical Record Treating RN: Phillis Haggisinkerton, [name] [id number] Number: Other Clinician: Date of Birth/Sex: 1946-02-21 [age] y.o. Male) Treating [name], [name] Primary Care Physician: Physician/Extender: G Referring Physician: SELF, REFERRED Weeks in Treatment: 6 Edema Assessment Assessed: [Left: No] [Right: No] E[Left: dema] [Right: :] Calf Left: Right: Point of Measurement: 40 cm From Medial Instep cm 36.5 cm Ankle Left: Right: Point of Measurement: 12 cm From Medial Instep cm 24.6 cm Vascular Assessment Pulses: Posterior Tibial Palpable: [Right:Yes] Dorsalis Pedis Palpable: [Right:Yes] Extremity colors, hair growth, and conditions: Extremity Color: [Right:Hyperpigmented] Temperature of Extremity: [Right:Warm] Capillary Refill: [Right:< 3 seconds] Toe Nail Assessment Left: Right: Thick: Yes Discolored: Yes Deformed: Yes Improper Length and Hygiene: No Electronic Signature(s) Signed: 02/29/2016 5:31:40 PM By: Alejandro MullingPinkerton, Debra Entered By: Alejandro MullingPinkerton, Debra on 02/29/2016 08:51:56 [name], [name] ([id number]) [name] CritchleyBLACKWELL, [name] ([id number]) -------------------------------------------------------------------------------- Multi Wound Chart Details [name], [name] Date of Service: 02/29/2016 8:45 AM Patient Name: C. Patient Account Number:  [id number] Medical Record Treating RN: Phillis Haggisinkerton, [name] [id number] Number: Other Clinician: Date of Birth/Sex: 1946-02-21 [age] y.o. Male) Treating [name], [name] Primary Care Physician: Physician/Extender: G Referring Physician: SELF, REFERRED Weeks in Treatment: 6 Vital Signs Height(in): 76 Pulse(bpm): 86 Weight(lbs): 238 Blood Pressure 141/61 (mmHg): Body Mass Index(BMI): 29 Temperature(F): 98.3 Respiratory Rate 18 (breaths/min): Photos: [1:No Photos] [2:No Photos] [N/A:N/A] Wound Location: [1:Right Malleolus - Lateral] [2:Right Malleolus - Medial N/A] Wounding Event: [1:Gradually Appeared] [2:Gradually Appeared] [N/A:N/A] Primary Etiology: [1:Venous Leg Ulcer] [2:Arterial Insufficiency Ulcer N/A] Comorbid History: [1:Cataracts, Chronic Obstructive Pulmonary Disease (COPD), Osteoarthritis] [2:Cataracts, Chronic Obstructive Pulmonary Disease (COPD), Osteoarthritis] [N/A:N/A] Date Acquired: [1:08/11/2015] [2:01/30/2016] [N/A:N/A] Weeks of Treatment: [1:6] [2:4] [N/A:N/A]  Wound Status: [1:Open] [2:Open] [N/A:N/A] Clustered Wound: [1:No] [2:Yes] [N/A:N/A] Measurements L x W x D 5.8x5.2x0.2 [2:6.3x9.2x0.2] [N/A:N/A] (cm) Area (cm) : [1:23.688] [2:45.522] [N/A:N/A] Volume (cm) : [1:4.738] [2:9.104] [N/A:N/A] % Reduction in Area: [1:-37.10%] [2:-294.30%] [N/A:N/A] % Reduction in Volume: 8.60% [2:-688.20%] [N/A:N/A] Classification: [1:Full Thickness Without Exposed Support Structures] [2:Full Thickness Without Exposed Support Structures] [N/A:N/A] Exudate Amount: [1:Large] [2:Large] [N/A:N/A] Exudate Type: [1:Serosanguineous] [2:Serosanguineous] [N/A:N/A] Exudate Color: [1:red, brown] [2:red, brown] [N/A:N/A] Foul Odor After [1:Yes] [2:Yes] [N/A:N/A] Cleansing: Odor Anticipated Due to No [2:No] [N/A:N/A] Product Use: REYN, FAIVRE. ([id number]) Wound Margin: Distinct, outline attached Indistinct, nonvisible N/A Granulation Amount: Small (1-33%) Small (1-33%)  N/A Granulation Quality: N/A Pink, Pale N/A Necrotic Amount: Large (67-100%) Large (67-100%) N/A Exposed Structures: Fascia: No Fascia: No N/A Fat: No Fat: No Tendon: No Tendon: No Muscle: No Muscle: No Joint: No Joint: No Bone: No Bone: No Limited to Skin Limited to Skin Breakdown Breakdown Epithelialization: None Small (1-33%) N/A Periwound Skin Texture: Scarring: Yes Edema: No N/A Edema: No Excoriation: No Excoriation: No Induration: No Induration: No Callus: No Callus: No Crepitus: No Crepitus: No Fluctuance: No Fluctuance: No Friable: No Friable: No Rash: No Rash: No Scarring: No Periwound Skin Maceration: Yes Moist: Yes N/A Moisture: Moist: Yes Maceration: No Dry/Scaly: No Dry/Scaly: No Periwound Skin Color: Hemosiderin Staining: Yes Hemosiderin Staining: Yes N/A Atrophie Blanche: No Atrophie Blanche: No Cyanosis: No Cyanosis: No Ecchymosis: No Ecchymosis: No Erythema: No Erythema: No Mottled: No Mottled: No Pallor: No Pallor: No Rubor: No Rubor: No Temperature: No Abnormality No Abnormality N/A Tenderness on No No N/A Palpation: Wound Preparation: Ulcer Cleansing: Ulcer Cleansing: N/A Rinsed/Irrigated with Rinsed/Irrigated with Saline, Other: SUrg Scrub Saline, Other: SUrg Scrub and water with water Topical Anesthetic Topical Anesthetic Applied: Other: Lidocaine Applied: Other: lidocaine 4% 4% Treatment Notes Electronic Signature(s) Signed: 02/29/2016 5:31:40 PM By: Aurelio Jew, [name] ([id number]) Entered By: Alejandro Mulling on 02/29/2016 08:59:25 [name] Stack ([id number]) -------------------------------------------------------------------------------- Multi-Disciplinary Care Plan Details [name] Date of Service: 02/29/2016 8:45 AM Patient Name: C. Patient Account Number: [id number] Medical Record Treating RN: [name] [id number] Number: Other Clinician: Date of Birth/Sex: September 16, 1945 (70  y.o. Male) Treating [name], [name] Primary Care Physician: Physician/Extender: G Referring Physician: SELF, REFERRED Weeks in Treatment: 6 Active Inactive Orientation to the Wound Care Program Nursing Diagnoses: Knowledge deficit related to the wound healing center program Goals: Patient/caregiver will verbalize understanding of the Wound Healing Center Program Date Initiated: 01/17/2016 Goal Status: Active Interventions: Provide education on orientation to the wound center Notes: Wound/Skin Impairment Nursing Diagnoses: Impaired tissue integrity Goals: Ulcer/skin breakdown will heal within 14 weeks Date Initiated: 01/17/2016 Goal Status: Active Interventions: Assess ulceration(s) every visit Notes: Electronic Signature(s) Signed: 02/29/2016 5:31:40 PM By: Alejandro Mulling Entered By: Alejandro Mulling on 02/29/2016 08:59:18 [name] Stack ([id number]) -------------------------------------------------------------------------------- Pain Assessment Details [name] Date of Service: 02/29/2016 8:45 AM Patient Name: C. Patient Account Number: [id number] Medical Record Treating RN: [name] [id number] Number: Other Clinician: Date of Birth/Sex: September 21, 1945 (70 y.o. Male) Treating [name], [name] Primary Care Physician: Physician/Extender: G Referring Physician: SELF, REFERRED Weeks in Treatment: 6 Active Problems Location of Pain Severity and Description of Pain Patient Has Paino No Site Locations With Dressing Change: No Pain Management and Medication Current Pain Management: Electronic Signature(s) Signed: 02/29/2016 5:31:40 PM By: Alejandro Mulling Entered By: Alejandro Mulling on 02/29/2016 08:37:07 [name] Stack ([id number]) -------------------------------------------------------------------------------- Patient/Caregiver Education Details [name] Date of Service: 02/29/2016 8:45 AM Patient Name: C. Patient Account Number:  [id number] Medical Record Treating RN: Phillis Haggisinkerton, [name] [id number] Number: Other Clinician: Date of Birth/Gender: [birth year](69 y.o. Male) Treating [name], [name] Primary Care Physician: Physician/Extender: G Referring Physician: SELF, REFERRED Weeks in Treatment: 6 Education Assessment Education Provided To: Patient Education Topics Provided Wound/Skin Impairment: Handouts: Other: do not get wrap wet Methods: Demonstration, Explain/Verbal Responses: State content correctly Electronic Signature(s) Signed: 02/29/2016 5:31:40 PM By: Alejandro MullingPinkerton, Debra Entered By: Alejandro MullingPinkerton, Debra on 02/29/2016 09:00:11 [name], Elmar C. ([id number]) -------------------------------------------------------------------------------- Wound Assessment Details [name], Domanic Date of Service: 02/29/2016 8:45 AM Patient Name: C. Patient Account Number: [id number] Medical Record Treating RN: Phillis Haggisinkerton, [name] [id number] Number: Other Clinician: Date of Birth/Sex: [birth year] [age] y.o. Male) Treating [name], [name] Primary Care Physician: Physician/Extender: G Referring Physician: SELF, REFERRED Weeks in Treatment: 6 Wound Status Wound Number: 1 Primary Venous Leg Ulcer Etiology: Wound Location: Right, Lateral Malleolus Wound Open Wounding Event: Gradually Appeared Status: Date Acquired: 08/11/2015 Comorbid Cataracts, Chronic Obstructive Weeks Of Treatment: 6 History: Pulmonary Disease (COPD), Clustered Wound: No Osteoarthritis Photos Photo Uploaded By: Alejandro MullingPinkerton, Debra on 02/29/2016 13:30:25 Wound Measurements Length: (cm) 6.3 Width: (cm) 9.2 Depth: (cm) 0.2 Area: (cm) 45.522 Volume: (cm) 9.104 % Reduction in Area: -163.5% % Reduction in Volume: -75.6% Epithelialization: None Tunneling: No Undermining: No Wound Description Full Thickness Without Exposed Classification: Support Structures Wound Margin: Distinct, outline attached Exudate Large Amount: Exudate Type:  Serosanguineous Exudate Color: red, brown Foul Odor After Cleansing: Yes Due to Product Use: No Wound Bed Wilcher, Nicholas C. ([id number]) Granulation Amount: Small (1-33%) Exposed Structure Necrotic Amount: Large (67-100%) Fascia Exposed: No Necrotic Quality: Adherent Slough Fat Layer Exposed: No Tendon Exposed: No Muscle Exposed: No Joint Exposed: No Bone Exposed: No Limited to Skin Breakdown Periwound Skin Texture Texture Color No Abnormalities Noted: No No Abnormalities Noted: No Callus: No Atrophie Blanche: No Crepitus: No Cyanosis: No Excoriation: No Ecchymosis: No Fluctuance: No Erythema: No Friable: No Hemosiderin Staining: Yes Induration: No Mottled: No Localized Edema: No Pallor: No Rash: No Rubor: No Scarring: Yes Temperature / Pain Moisture Temperature: No Abnormality No Abnormalities Noted: No Dry / Scaly: No Maceration: Yes Moist: Yes Wound Preparation Ulcer Cleansing: Rinsed/Irrigated with Saline, Other: SUrg Scrub and water, Topical Anesthetic Applied: Other: Lidocaine 4%, Treatment Notes Wound #1 (Right, Lateral Malleolus) 1. Cleansed with: Cleanse wound with antibacterial soap and water 2. Anesthetic Topical Lidocaine 4% cream to wound bed prior to debridement 3. Peri-wound Care: Barrier cream Moisturizing lotion 4. Dressing Applied: Calcium Alginate Santyl Ointment 5. Secondary Dressing Applied ABD Pad Dry Gauze Daffin, Kamrin C. ([id number]) 7. Secured with Tape 2 Layer Lite Compression System - Right Lower Extremity Notes drawtex, xtrasorb Electronic Signature(s) Signed: 02/29/2016 5:31:40 PM By: Alejandro MullingPinkerton, Debra Entered By: Alejandro MullingPinkerton, Debra on 02/29/2016 09:22:14 [name], Gareld C. ([id number]) -------------------------------------------------------------------------------- Wound Assessment Details [name], Desmen Date of Service: 02/29/2016 8:45 AM Patient Name: C. Patient Account Number: [id number] Medical Record  Treating RN: Phillis Haggisinkerton, [name] [id number] Number: Other Clinician: Date of Birth/Sex: [birth year] [age] y.o. Male) Treating [name], [name] Primary Care Physician: Physician/Extender: G Referring Physician: SELF, REFERRED Weeks in Treatment: 6 Wound Status Wound Number: 2 Primary Arterial Insufficiency Ulcer Etiology: Wound Location: Right, Medial Malleolus Wound Open Wounding Event: Gradually Appeared Status: Date Acquired: 01/30/2016 Comorbid Cataracts, Chronic Obstructive Weeks Of Treatment: 4 History: Pulmonary Disease (COPD), Clustered Wound: Yes Osteoarthritis Photos Photo Uploaded By: Alejandro MullingPinkerton, Debra on 02/29/2016 13:31:06 Wound Measurements Length: (cm) 5.8 Width: (cm) 5.2 Depth: (cm) 0.2 Area: (cm) 23.688 Volume: (cm) 4.738 % Reduction in Area: -105.2% % Reduction in Volume: -310.2%  Epithelialization: Small (1-33%) Tunneling: No Undermining: No Wound Description Full Thickness Without Exposed Classification: Support Structures Wound Margin: Indistinct, nonvisible Exudate Large Amount: Exudate Type: Serosanguineous Exudate Color: red, brown Foul Odor After Cleansing: Yes Due to Product Use: No Wound Bed Bugaj, Treydon C. ([id number]) Granulation Amount: Small (1-33%) Exposed Structure Granulation Quality: Pink, Pale Fascia Exposed: No Necrotic Amount: Large (67-100%) Fat Layer Exposed: No Necrotic Quality: Adherent Slough Tendon Exposed: No Muscle Exposed: No Joint Exposed: No Bone Exposed: No Limited to Skin Breakdown Periwound Skin Texture Texture Color No Abnormalities Noted: No No Abnormalities Noted: No Callus: No Atrophie Blanche: No Crepitus: No Cyanosis: No Excoriation: No Ecchymosis: No Fluctuance: No Erythema: No Friable: No Hemosiderin Staining: Yes Induration: No Mottled: No Localized Edema: No Pallor: No Rash: No Rubor: No Scarring: No Temperature / Pain Moisture Temperature: No Abnormality No Abnormalities Noted:  No Dry / Scaly: No Maceration: No Moist: Yes Wound Preparation Ulcer Cleansing: Rinsed/Irrigated with Saline, Other: SUrg Scrub with water, Topical Anesthetic Applied: Other: lidocaine 4%, Treatment Notes Wound #2 (Right, Medial Malleolus) 1. Cleansed with: Cleanse wound with antibacterial soap and water 2. Anesthetic Topical Lidocaine 4% cream to wound bed prior to debridement 3. Peri-wound Care: Barrier cream Moisturizing lotion 4. Dressing Applied: Calcium Alginate Santyl Ointment 5. Secondary Dressing Applied ABD Pad Dry Gauze Hickox, Antavion C. ([id number]) 7. Secured with Tape 2 Layer Lite Compression System - Right Lower Extremity Notes drawtex, xtrasorb Electronic Signature(s) Signed: 02/29/2016 5:31:40 PM By: Alejandro Mulling Entered By: Alejandro Mulling on 02/29/2016 09:22:14 [name] Stack ([id number]) -------------------------------------------------------------------------------- Vitals Details [name] Date of Service: 02/29/2016 8:45 AM Patient Name: C. Patient Account Number: [id number] Medical Record Treating RN: [name] [id number] Number: Other Clinician: Date of Birth/Sex: 03-16-1946 (70 y.o. Male) Treating [name], [name] Primary Care Physician: Physician/Extender: G Referring Physician: SELF, REFERRED Weeks in Treatment: 6 Vital Signs Time Taken: 08:39 Temperature (F): 98.3 Height (in): 76 Pulse (bpm): 86 Weight (lbs): 238 Respiratory Rate (breaths/min): 18 Body Mass Index (BMI): 29 Blood Pressure (mmHg): 141/61 Reference Range: 80 - 120 mg / dl Electronic Signature(s) Signed: 02/29/2016 5:31:40 PM By: Alejandro Mulling Entered By: Alejandro Mulling on 02/29/2016 08:41:03
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
## 2016-03-02 DIAGNOSIS — I87331 Chronic venous hypertension (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: Secondary | ICD-10-CM | POA: Diagnosis not present

## 2016-03-03 NOTE — Progress Notes (Signed)
[name], [name] ([id number]) Visit Report for 03/02/2016 Arrival Information Details Patient Name: [name], [name]. Date of Service: 03/02/2016 3:15 PM Medical Record Number: [id number] Patient Account Number: [id number] Date of Birth/Sex: March 25, 1946 (70 y.o. Male) Treating RN: [name] Primary Care Physician: Other Clinician: Referring Physician: SELF, REFERRED Treating Physician/Extender: [name] in Treatment: 6 Visit Information History Since Last Visit All ordered tests and consults were No Patient Arrived: Ambulatory completed: Arrival Time: 15:43 Added or deleted any medications: No Accompanied By: spouse Any new allergies or adverse reactions: No Transfer Assistance: None Had a fall or experienced change in No Patient Identification Verified: Yes activities of daily living that may affect Secondary Verification Process Yes risk of falls: Completed: Signs or symptoms of abuse/neglect since No Patient Requires Transmission- No last visito Based Precautions: Hospitalized since last visit: No Patient Has Alerts: Yes Has Dressing in Place as Prescribed: Yes Patient Alerts: Patient on Blood Has Compression in Place as Prescribed: Yes Thinner Has Footwear/Offloading in Place as Yes Prescribed: Right: Wedge Shoe Pain Present Now: No Electronic Signature(s) Signed: 03/02/2016 4:11:07 PM By: [name] Entered By: [name] on 03/02/2016 15:44:29 [name] ([id number]) -------------------------------------------------------------------------------- Clinic Level of Care Assessment Details Patient Name: [name] Date of Service: 03/02/2016 3:15 PM Medical Record Number: [id number] Patient Account Number: [id number] Date of Birth/Sex: March 28, 1946 (70 y.o. Male) Treating RN: [name] Primary Care Physician: Other Clinician: Referring Physician: SELF, REFERRED Treating Physician/Extender: [name]  in Treatment: 6 Clinic Level of Care Assessment Items TOOL 4 Quantity Score []  - Use when only an EandM is performed on FOLLOW-UP visit 0 ASSESSMENTS - Nursing Assessment / Reassessment []  - Reassessment of Co-morbidities (includes updates in patient status) 0 []  - Reassessment of Adherence to Treatment Plan 0 ASSESSMENTS - Wound and Skin Assessment / Reassessment []  - Simple Wound Assessment / Reassessment - one wound 0 X - Complex Wound Assessment / Reassessment - multiple wounds 2 5 []  - Dermatologic / Skin Assessment (not related to wound area) 0 ASSESSMENTS - Focused Assessment []  - Circumferential Edema Measurements - multi extremities 0 []  - Nutritional Assessment / Counseling / Intervention 0 []  - Lower Extremity Assessment (monofilament, tuning fork, pulses) 0 []  - Peripheral Arterial Disease Assessment (using hand held doppler) 0 ASSESSMENTS - Ostomy and/or Continence Assessment and Care []  - Incontinence Assessment and Management 0 []  - Ostomy Care Assessment and Management (repouching, etc.) 0 PROCESS - Coordination of Care X - Simple Patient / Family Education for ongoing care 1 15 []  - Complex (extensive) Patient / Family Education for ongoing care 0 []  - Staff obtains Chiropractor, Records, Test Results / Process Orders 0 []  - Staff telephones HHA, Nursing Homes / Clarify orders / etc 0 []  - Routine Transfer to another Facility (non-emergent condition) 0 [name] ([id number]) []  - Routine Hospital Admission (non-emergent condition) 0 []  - New Admissions / Manufacturing engineer / Ordering NPWT, Apligraf, etc. 0 []  - Emergency Hospital Admission (emergent condition) 0 []  - Simple Discharge Coordination 0 []  - Complex (extensive) Discharge Coordination 0 PROCESS - Special Needs []  - Pediatric / Minor Patient Management 0 []  - Isolation Patient Management 0 []  - Hearing / Language / Visual special needs 0 []  - Assessment of Community assistance (transportation,  D/C planning, etc.) 0 []  - Additional assistance / Altered mentation 0 []  - Support Surface(s) Assessment (bed, cushion, seat, etc.) 0 INTERVENTIONS - Wound Cleansing / Measurement []  - Simple Wound Cleansing - one wound 0  X - Complex Wound Cleansing - multiple wounds 1 5 []  - Wound Imaging (photographs - any number of wounds) 0 []  - Wound Tracing (instead of photographs) 0 []  - Simple Wound Measurement - one wound 0 []  - Complex Wound Measurement - multiple wounds 0 INTERVENTIONS - Wound Dressings []  - Small Wound Dressing one or multiple wounds 0 X - Medium Wound Dressing one or multiple wounds 2 15 []  - Large Wound Dressing one or multiple wounds 0 []  - Application of Medications - topical 0 []  - Application of Medications - injection 0 INTERVENTIONS - Miscellaneous []  - External ear exam 0 [name], [name] ([id number]) []  - Specimen Collection (cultures, biopsies, blood, body fluids, etc.) 0 []  - Specimen(s) / Culture(s) sent or taken to Lab for analysis 0 []  - Patient Transfer (multiple staff / Michiel Sites Lift / Similar devices) 0 []  - Simple Staple / Suture removal (25 or less) 0 []  - Complex Staple / Suture removal (26 or more) 0 []  - Hypo / Hyperglycemic Management (close monitor of Blood Glucose) 0 []  - Ankle / Brachial Index (ABI) - do not check if billed separately 0 X - Vital Signs 1 5 Has the patient been seen at the hospital within the last three years: Yes Total Score: 65 Level Of Care: New/Established - Level 2 Electronic Signature(s) Signed: 03/02/2016 4:11:07 PM By: [name] Entered By: [name] on 03/02/2016 16:10:45 [name] ([id number]) -------------------------------------------------------------------------------- Compression Therapy Details Patient Name: [name] Date of Service: 03/02/2016 3:15 PM Medical Record Number: [id number] Patient Account Number: [id number] Date of Birth/Sex: 02/23/1946 (70 y.o. Male) Treating RN:  [name] Primary Care Physician: Other Clinician: Referring Physician: SELF, REFERRED Treating Physician/Extender: [name] in Treatment: 6 Compression Therapy Performed for Wound Wound #1 Right,Lateral Malleolus Assessment: Performed By: Clinician [name] Lipa, RN Compression Type: Double Layer Pre Treatment ABI: 1.2 Electronic Signature(s) Signed: 03/02/2016 4:11:07 PM By: [name] Entered By: [name] on 03/02/2016 69:62:95 [name] ([id number]) -------------------------------------------------------------------------------- Compression Therapy Details Patient Name: [name] Date of Service: 03/02/2016 3:15 PM Medical Record Number: [id number] Patient Account Number: [id number] Date of Birth/Sex: December 14, 1945 (70 y.o. Male) Treating RN: [name] Primary Care Physician: Other Clinician: Referring Physician: SELF, REFERRED Treating Physician/Extender: [name] in Treatment: 6 Compression Therapy Performed for Wound Wound #2 Right,Medial Malleolus Assessment: Performed By: Clinician [name] Lipa, RN Compression Type: Double Layer Pre Treatment ABI: 1.2 Electronic Signature(s) Signed: 03/02/2016 4:11:07 PM By: [name] Entered By: [name] on 03/02/2016 16:08:22 [name] ([id number]) -------------------------------------------------------------------------------- Encounter Discharge Information Details Patient Name: [name] Date of Service: 03/02/2016 3:15 PM Medical Record Number: [id number] Patient Account Number: [id number] Date of Birth/Sex: 1946/03/29 (70 y.o. Male) Treating RN: [name] Primary Care Physician: Other Clinician: Referring Physician: SELF, REFERRED Treating Physician/Extender: [name] in Treatment: 6 Encounter Discharge Information Items Discharge Pain Level: 0 Discharge Condition: Stable Ambulatory Status:  Ambulatory Discharge Destination: Home Transportation: Private Auto Accompanied By: spouse Schedule Follow-up Appointment: No Medication Reconciliation completed No and provided to Patient/Care Brezlyn Manrique: Patient Clinical Summary of Care: Declined Electronic Signature(s) Signed: 03/02/2016 4:11:07 PM By: [name] Entered By: [name] on 03/02/2016 16:10:01 [name] ([id number]) -------------------------------------------------------------------------------- Patient/Caregiver Education Details Patient Name: [name] Date of Service: 03/02/2016 3:15 PM Medical Record Number: [id number] Patient Account Number: [id number] Date of Birth/Gender: Jul 07, 1946 (70 y.o. Male) Treating RN: [name] Primary Care Physician: Other Clinician: Referring Physician: SELF, REFERRED Treating Physician/Extender: Evlyn Kanner  Weeks in Treatment: 6 Education Assessment Education Provided To: Patient Education Topics Provided Electronic Signature(s) Signed: 03/02/2016 4:11:07 PM By: [name] Entered By: [name] on 03/02/2016 16:09:44 [name] ([id number]) -------------------------------------------------------------------------------- Wound Assessment Details Patient Name: [name] Date of Service: 03/02/2016 3:15 PM Medical Record Number: [id number] Patient Account Number: [id number] Date of Birth/Sex: 1946-06-07 (70 y.o. Male) Treating RN: [name] Primary Care Physician: Other Clinician: Referring Physician: SELF, REFERRED Treating Physician/Extender: [name] in Treatment: 6 Wound Status Wound Number: 1 Primary Venous Leg Ulcer Etiology: Wound Location: Right Malleolus - Lateral Wound Open Wounding Event: Gradually Appeared Status: Date Acquired: 08/11/2015 Comorbid Cataracts, Chronic Obstructive Weeks Of Treatment: 6 History: Pulmonary Disease (COPD), Clustered Wound:  No Osteoarthritis Wound Measurements Length: (cm) 6.3 Width: (cm) 9.2 Depth: (cm) 0.2 Area: (cm) 45.522 Volume: (cm) 9.104 % Reduction in Area: -163.5% % Reduction in Volume: -75.6% Epithelialization: Small (1-33%) Tunneling: No Undermining: No Wound Description Full Thickness Without Exposed Classification: Support Structures Wound Margin: Distinct, outline attached Exudate Small Amount: Exudate Type: Serosanguineous Exudate Color: red, brown Foul Odor After Cleansing: Yes Due to Product Use: No Wound Bed Granulation Amount: Small (1-33%) Exposed Structure Granulation Quality: Pink Fascia Exposed: No Necrotic Amount: Large (67-100%) Fat Layer Exposed: No Necrotic Quality: Adherent Slough Tendon Exposed: No Muscle Exposed: No Joint Exposed: No Bone Exposed: No Limited to Skin Breakdown Periwound Skin Texture Texture Color No Abnormalities Noted: No No Abnormalities Noted: No Callus: No Atrophie Blanche: No Longbottom, Abrar C. ([id number]) Crepitus: No Cyanosis: No Excoriation: No Ecchymosis: No Fluctuance: No Erythema: No Friable: No Hemosiderin Staining: Yes Induration: No Mottled: No Localized Edema: No Pallor: No Rash: No Rubor: No Scarring: Yes Temperature / Pain Moisture Temperature: No Abnormality No Abnormalities Noted: No Dry / Scaly: No Maceration: Yes Moist: Yes Wound Preparation Ulcer Cleansing: Rinsed/Irrigated with Saline, Other: SUrg Scrub and water, Topical Anesthetic Applied: Other: Lidocaine 4%, Treatment Notes Wound #1 (Right, Lateral Malleolus) 1. Cleansed with: Other cleanser (specify in notes) 3. Peri-wound Care: Moisturizing lotion 4. Dressing Applied: Calcium Alginate with Silver Notes santyl , xtra sorb , drawtex , abd pad , kerlix and coban Electronic Signature(s) Signed: 03/02/2016 4:11:07 PM By: [name] Entered By: [name] on 03/02/2016 16:07:19 [name]  ([id number]) -------------------------------------------------------------------------------- Wound Assessment Details Patient Name: [name] Date of Service: 03/02/2016 3:15 PM Medical Record Number: [id number] Patient Account Number: [id number] Date of Birth/Sex: February 14, 1946 (70 y.o. Male) Treating RN: [name] Primary Care Physician: Other Clinician: Referring Physician: SELF, REFERRED Treating Physician/Extender: [name] in Treatment: 6 Wound Status Wound Number: 2 Primary Arterial Insufficiency Ulcer Etiology: Wound Location: Right Malleolus - Medial Wound Open Wounding Event: Gradually Appeared Status: Date Acquired: 01/30/2016 Comorbid Cataracts, Chronic Obstructive Weeks Of Treatment: 4 History: Pulmonary Disease (COPD), Clustered Wound: Yes Osteoarthritis Wound Measurements Length: (cm) 5.8 Width: (cm) 5.2 Depth: (cm) 0.2 Area: (cm) 23.688 Volume: (cm) 4.738 % Reduction in Area: -105.2% % Reduction in Volume: -310.2% Epithelialization: Small (1-33%) Tunneling: No Undermining: No Wound Description Full Thickness Without Exposed Classification: Support Structures Wound Margin: Indistinct, nonvisible Exudate Large Amount: Exudate Type: Serosanguineous Exudate Color: red, brown Foul Odor After Cleansing: Yes Due to Product Use: No Wound Bed Granulation Amount: Small (1-33%) Exposed Structure Granulation Quality: Pink, Pale Fascia Exposed: No Necrotic Amount: Large (67-100%) Fat Layer Exposed: No Necrotic Quality: Adherent Slough Tendon Exposed: No Muscle Exposed: No Joint Exposed: No Bone Exposed: No Limited to Skin Breakdown Periwound Skin Texture Texture Color No Abnormalities  Noted: No No Abnormalities Noted: No Callus: No Atrophie Blanche: No [name], [name] ([id number]) Crepitus: No Cyanosis: No Excoriation: No Ecchymosis: No Fluctuance: No Erythema: No Friable: No Hemosiderin Staining:  Yes Induration: No Mottled: No Localized Edema: No Pallor: No Rash: No Rubor: No Scarring: No Temperature / Pain Moisture Temperature: No Abnormality No Abnormalities Noted: No Dry / Scaly: No Maceration: No Moist: Yes Wound Preparation Ulcer Cleansing: Rinsed/Irrigated with Saline, Other: SUrg Scrub with water, Topical Anesthetic Applied: Other: lidocaine 4%, Treatment Notes Wound #2 (Right, Medial Malleolus) 1. Cleansed with: Other cleanser (specify in notes) 3. Peri-wound Care: Moisturizing lotion 4. Dressing Applied: Calcium Alginate with Silver Notes santyl , xtra sorb , drawtex , abd pad , kerlix and coban Electronic Signature(s) Signed: 03/02/2016 4:11:07 PM By: [name] LipaPriddy, Jennifer Entered By: [name] LipaPriddy, Jennifer on 03/02/2016 16:07:29

## 2016-03-07 ENCOUNTER — Other Ambulatory Visit
Admission: RE | Admit: 2016-03-07 | Discharge: 2016-03-07 | Disposition: A | Discharge status: Home / Self Care | Payer: Medicare HMO | Source: Ambulatory Visit | Attending: Nurse Practitioner | Admitting: Nurse Practitioner

## 2016-03-07 ENCOUNTER — Encounter: Payer: Medicare HMO | Admitting: Nurse Practitioner

## 2016-03-07 DIAGNOSIS — B999 Unspecified infectious disease: Secondary | ICD-10-CM | POA: Diagnosis present

## 2016-03-07 DIAGNOSIS — I87331 Chronic venous hypertension (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: Secondary | ICD-10-CM | POA: Diagnosis not present

## 2016-03-09 DIAGNOSIS — I87331 Chronic venous hypertension (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: Secondary | ICD-10-CM | POA: Diagnosis not present
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
## 2016-03-09 NOTE — Progress Notes (Signed)
ELLWYN, ERGLE ([id number]) Visit Report for 03/07/2016 Arrival Information Details Patient Name: [name], [name]. Date of Service: 03/07/2016 12:45 PM Medical Record Number: [id number] Patient Account Number: [id number] Date of Birth/Sex: 05-28-1946 (70 y.o. Male) Treating RN: Leonard Downing Primary Care Physician: Other Clinician: Referring Physician: SELF, REFERRED Treating Physician/Extender: Eugene Garnet in Treatment: 7 Visit Information History Since Last Visit All ordered tests and consults were completed: No Patient Arrived: Ambulatory Added or deleted any medications: No Arrival Time: 12:48 Any new allergies or adverse reactions: No Accompanied By: wife Had a fall or experienced change in No Transfer Assistance: None activities of daily living that may affect Patient Identification Verified: Yes risk of falls: Secondary Verification Process Yes Signs or symptoms of abuse/neglect since last No Completed: visito Patient Requires Transmission- No Hospitalized since last visit: No Based Precautions: Has Dressing in Place as Prescribed: Yes Patient Has Alerts: Yes Pain Present Now: No Patient Alerts: Patient on Blood Thinner Electronic Signature(s) Signed: 03/07/2016 4:17:03 PM By: Lucrezia Starch, RN, Sendra Entered By: Lucrezia Starch RN, Sendra on 03/07/2016 12:49:10 [name] ([id number]) -------------------------------------------------------------------------------- Encounter Discharge Information Details Patient Name: [name] Date of Service: 03/07/2016 12:45 PM Medical Record Number: [id number] Patient Account Number: [id number] Date of Birth/Sex: [birth year] (70 y.o. Male) Treating RN: Leonard Downing Primary Care Physician: Other Clinician: Referring Physician: SELF, REFERRED Treating Physician/Extender: Eugene Garnet in Treatment: 7 Encounter Discharge Information Items Discharge Pain Level: 0 Discharge Condition:  Stable Ambulatory Status: Ambulatory Discharge Destination: Home Transportation: Private Auto Schedule Follow-up Appointment: Yes Medication Reconciliation completed and provided to Patient/Care Yes [name]: Provided on Clinical Summary of Care: 03/07/2016 Form Type Recipient Paper Patient LB Electronic Signature(s) Signed: 03/07/2016 4:17:03 PM By: Lucrezia Starch RN, Sendra Previous Signature: 03/07/2016 1:37:27 PM Version By: Gwenlyn Perking Entered By: Lucrezia Starch RN, Sendra on 03/07/2016 13:53:11 [name] ([id number]) -------------------------------------------------------------------------------- Lower Extremity Assessment Details Patient Name: [name] Date of Service: 03/07/2016 12:45 PM Medical Record Number: [id number] Patient Account Number: [id number] Date of Birth/Sex: Jan 04, 1946 (70 y.o. Male) Treating RN: Leonard Downing Primary Care Physician: Other Clinician: Referring Physician: SELF, REFERRED Treating Physician/Extender: Eugene Garnet in Treatment: 7 Edema Assessment Assessed: [Left: No] [Right: No] Edema: [Left: N] [Right: o] Calf Left: Right: Point of Measurement: 40 cm From Medial Instep cm 36.7 cm Ankle Left: Right: Point of Measurement: 12 cm From Medial Instep cm 22.5 cm Vascular Assessment Pulses: Posterior Tibial Dorsalis Pedis Palpable: [Right:Yes] Extremity colors, hair growth, and conditions: Extremity Color: [Right:Mottled] Hair Growth on Extremity: [Right:Yes] Temperature of Extremity: [Right:Warm] Capillary Refill: [Right:< 3 seconds] Toe Nail Assessment Left: Right: Thick: Yes Discolored: No Deformed: No Improper Length and Hygiene: No Electronic Signature(s) Signed: 03/07/2016 4:17:03 PM By: Lucrezia Starch, RN, Sendra Entered By: Lucrezia Starch RN, Sendra on 03/07/2016 13:01:18 [name] ([id number]) -------------------------------------------------------------------------------- Pain Assessment  Details Patient Name: [name] Date of Service: 03/07/2016 12:45 PM Medical Record Number: [id number] Patient Account Number: [id number] Date of Birth/Sex: 08/04/1946 (70 y.o. Male) Treating RN: Leonard Downing Primary Care Physician: Other Clinician: Referring Physician: SELF, REFERRED Treating Physician/Extender: Eugene Garnet in Treatment: 7 Active Problems Location of Pain Severity and Description of Pain Patient Has Paino No Site Locations Pain Management and Medication Current Pain Management: Electronic Signature(s) Signed: 03/07/2016 4:17:03 PM By: Lucrezia Starch RN, Sendra Entered By: Lucrezia Starch RN, Sendra on 03/07/2016 12:49:39 [name] ([id number]) -------------------------------------------------------------------------------- Patient/Caregiver Education Details Patient Name: [name] Date of Service: 03/07/2016 12:45 PM Medical Record Number: [id number] Patient Account Number: [id number] Date  of Birth/Gender: 03/22/1946 [age] y.o. Male) Treating RN: Leonard Down[place], Sendra Primary Care Physician: Other Clinician: Referring Physician: SELF, REFERRED Treating Physician/Extender: Eugene GarnetSaunders, Sharon Weeks in Treatment: 7 Education Assessment Education Provided To: Patient Education Topics Provided Wound/Skin Impairment: Handouts: Caring for Your Ulcer, Skin Care Do's and Dont's Methods: Explain/Verbal Responses: State content correctly Electronic Signature(s) Signed: 03/07/2016 4:17:03 PM By: Lucrezia Starchose[place], RN, Sendra Entered By: Lucrezia Starch[place], RN, Sendra on 03/07/2016 13:53:23 [name] StackBLACKWELL, [name] ([id number]) -------------------------------------------------------------------------------- Wound Assessment Details Patient Name: [name] StackBLACKWELL, [name] Date of Service: 03/07/2016 12:45 PM Medical Record Number: [id number] Patient Account Number: [id number] Date of Birth/Sex: 03/22/1946 [age] y.o. Male) Treating RN: Leonard Downing[place], Sendra Primary Care  Physician: Other Clinician: Referring Physician: SELF, REFERRED Treating Physician/Extender: Eugene GarnetSaunders, Sharon Weeks in Treatment: 7 Wound Status Wound Number: 1 Primary Venous Leg Ulcer Etiology: Wound Location: Right Malleolus - Lateral Wound Open Wounding Event: Gradually Appeared Status: Date Acquired: 08/11/2015 Comorbid Cataracts, Chronic Obstructive Weeks Of Treatment: 7 History: Pulmonary Disease (COPD), Clustered Wound: No Osteoarthritis Photos Photo Uploaded By: Lucrezia Starchoseboro, RN, Rosalio MacadamiaSendra on 03/07/2016 15:33:25 Wound Measurements Length: (cm) 11 Width: (cm) 7.4 Depth: (cm) 0.3 Area: (cm) 63.931 Volume: (cm) 19.179 % Reduction in Area: -270% % Reduction in Volume: -270% Epithelialization: Small (1-33%) Wound Description Full Thickness Without Exposed Classification: Support Structures Wound Margin: Distinct, outline attached Exudate Small Amount: Exudate Type: Serosanguineous Exudate Color: red, brown Foul Odor After Cleansing: Yes Due to Product Use: No Wound Bed Granulation Amount: Small (1-33%) Exposed Structure Granulation Quality: Pink, Hyper-granulation Fascia Exposed: No Bogard, Daymen C. ([id number]) Necrotic Amount: Large (67-100%) Fat Layer Exposed: No Necrotic Quality: Adherent Slough Tendon Exposed: No Muscle Exposed: No Joint Exposed: No Bone Exposed: No Limited to Skin Breakdown Periwound Skin Texture Texture Color No Abnormalities Noted: No No Abnormalities Noted: No Callus: No Atrophie Blanche: No Crepitus: No Cyanosis: No Excoriation: No Ecchymosis: No Fluctuance: No Erythema: No Friable: No Hemosiderin Staining: Yes Induration: No Mottled: No Localized Edema: No Pallor: No Rash: No Rubor: No Scarring: Yes Temperature / Pain Moisture Temperature: No Abnormality No Abnormalities Noted: No Dry / Scaly: No Maceration: Yes Moist: Yes Wound Preparation Ulcer Cleansing: Rinsed/Irrigated with Saline, Other: SUrg Scrub  and water, Topical Anesthetic Applied: Other: Lidocaine 4%, Treatment Notes Wound #1 (Right, Lateral Malleolus) 1. Cleansed with: Other cleanser (specify in notes) 2. Anesthetic Topical Lidocaine 4% cream to wound bed prior to debridement 3. Peri-wound Care: Barrier cream 4. Dressing Applied: Other dressing (specify in notes) 5. Secondary Dressing Applied Guaze, ABD and kerlix/Conform 9. Other Orders Culture obtained per physician order Notes hibiclens to wash wound, RTD dressing applied [name] StackBLACKWELL, [name] ([id number]) Electronic Signature(s) Signed: 03/07/2016 4:17:03 PM By: Lucrezia Starch[place], RN, Sendra Entered By: Lucrezia Starch[place], RN, Sendra on 03/07/2016 13:25:45 [name] StackBLACKWELL, Ariel C. ([id number]) -------------------------------------------------------------------------------- Wound Assessment Details Patient Name: [name] StackBLACKWELL, [name] Date of Service: 03/07/2016 12:45 PM Medical Record Number: [id number] Patient Account Number: [id number] Date of Birth/Sex: 03/22/1946 [age] y.o. Male) Treating RN: Leonard Downing[place], Sendra Primary Care Physician: Other Clinician: Referring Physician: SELF, REFERRED Treating Physician/Extender: Eugene GarnetSaunders, Sharon Weeks in Treatment: 7 Wound Status Wound Number: 2 Primary Arterial Insufficiency Ulcer Etiology: Wound Location: Right Malleolus - Medial Wound Open Wounding Event: Gradually Appeared Status: Date Acquired: 01/30/2016 Comorbid Cataracts, Chronic Obstructive Weeks Of Treatment: 5 History: Pulmonary Disease (COPD), Clustered Wound: Yes Osteoarthritis Photos Photo Uploaded By: Lucrezia Starch[place], RN, Rosalio MacadamiaSendra on 03/07/2016 15:33:25 Wound Measurements Length: (cm) 6 Width: (cm) 3 Depth: (cm) 0.3 Area: (cm) 14.137 Volume: (cm) 4.241 % Reduction in Area: -22.5% % Reduction in Volume: -267.2% Epithelialization:  Small (1-33%) Wound Description Full Thickness Without Exposed Classification: Support Structures Wound Margin: Indistinct,  nonvisible Exudate Large Amount: Exudate Type: Serosanguineous Exudate Color: red, brown Foul Odor After Cleansing: Yes Due to Product Use: No Wound Bed Granulation Amount: Small (1-33%) Exposed Structure Granulation Quality: Pink, Pale Fascia Exposed: No Robello, Cale C. ([id number]) Necrotic Amount: Large (67-100%) Fat Layer Exposed: No Necrotic Quality: Adherent Slough Tendon Exposed: No Muscle Exposed: No Joint Exposed: No Bone Exposed: No Limited to Skin Breakdown Periwound Skin Texture Texture Color No Abnormalities Noted: No No Abnormalities Noted: No Callus: No Atrophie Blanche: No Crepitus: No Cyanosis: No Excoriation: No Ecchymosis: No Fluctuance: No Erythema: No Friable: No Hemosiderin Staining: Yes Induration: No Mottled: No Localized Edema: No Pallor: No Rash: No Rubor: No Scarring: No Temperature / Pain Moisture Temperature: No Abnormality No Abnormalities Noted: No Dry / Scaly: No Maceration: Yes Moist: Yes Wound Preparation Ulcer Cleansing: Rinsed/Irrigated with Saline, Other: SUrg Scrub with water, Topical Anesthetic Applied: Other: lidocaine 4%, Treatment Notes Wound #2 (Right, Medial Malleolus) 1. Cleansed with: Other cleanser (specify in notes) 2. Anesthetic Topical Lidocaine 4% cream to wound bed prior to debridement 3. Peri-wound Care: Barrier cream 4. Dressing Applied: Other dressing (specify in notes) 5. Secondary Dressing Applied Guaze, ABD and kerlix/Conform 9. Other Orders Culture obtained per physician order Notes hibiclens to wash wound, RTD dressing applied NILE, PRISK ([id number]) Electronic Signature(s) Signed: 03/07/2016 4:17:03 PM By: Lucrezia Starch RN, Sendra Entered By: Lucrezia Starch RN, Sendra on 03/07/2016 13:25:57 [name] ([id number]) -------------------------------------------------------------------------------- Vitals Details Patient Name: [name] Date of Service: 03/07/2016  12:45 PM Medical Record Number: [id number] Patient Account Number: [id number] Date of Birth/Sex: 1946-05-09 (70 y.o. Male) Treating RN: Leonard Downing Primary Care Physician: Other Clinician: Referring Physician: SELF, REFERRED Treating Physician/Extender: Eugene Garnet in Treatment: 7 Vital Signs Time Taken: 12:49 Temperature (F): 98.1 Height (in): 76 Reference Range: 80 - 120 mg / dl Weight (lbs): 629 Body Mass Index (BMI): 29 Electronic Signature(s) Signed: 03/07/2016 4:17:03 PM By: Lucrezia Starch RN, Sendra Entered By: Lucrezia Starch RN, Sendra on 03/07/2016 12:49:50

## 2016-03-10 NOTE — Progress Notes (Addendum)
ROSEMARY, PENTECOST ([id number]) Visit Report for 03/09/2016 Arrival Information Details Patient Name: [name], [name]. Date of Service: 03/09/2016 3:45 PM Medical Record Number: [id number] Patient Account Number: [id number] Date of Birth/Sex: 28-Mar-1946 (70 y.o. Male) Treating RN: [name], RN, BSN, [place] [employer] Primary Care Physician: Other Clinician: Referring Physician: SELF, REFERRED Treating Physician/Extender: Elayne Snare in Treatment: 7 Visit Information History Since Last Visit Added or deleted any medications: No Patient Arrived: Ambulatory Any new allergies or adverse reactions: No Arrival Time: 15:48 Had a fall or experienced change in No Accompanied By: wife activities of daily living that may affect Transfer Assistance: None risk of falls: Patient Identification Verified: Yes Signs or symptoms of abuse/neglect since last No Secondary Verification Process Yes visito Completed: Hospitalized since last visit: No Patient Requires Transmission- No Has Dressing in Place as Prescribed: Yes Based Precautions: Pain Present Now: No Patient Has Alerts: Yes Patient Alerts: Patient on Blood Thinner Electronic Signature(s) Signed: 03/09/2016 4:05:58 PM By: [name] BSN, RN Entered By: [name] on 03/09/2016 16:05:58 [name] ([id number]) -------------------------------------------------------------------------------- Clinic Level of Care Assessment Details Patient Name: [name] Date of Service: 03/09/2016 3:45 PM Medical Record Number: [id number] Patient Account Number: [id number] Date of Birth/Sex: 1945/10/15 (70 y.o. Male) Treating RN: [name], RN, BSN, Psychologist, clinical Primary Care Physician: Other Clinician: Referring Physician: SELF, REFERRED Treating Physician/Extender: Elayne Snare in Treatment: 7 Clinic Level of Care Assessment Items TOOL 4 Quantity Score []  - Use when only an EandM is performed on FOLLOW-UP visit 0 ASSESSMENTS - Nursing  Assessment / Reassessment X - Reassessment of Co-morbidities (includes updates in patient status) 1 10 X - Reassessment of Adherence to Treatment Plan 1 5 ASSESSMENTS - Wound and Skin Assessment / Reassessment []  - Simple Wound Assessment / Reassessment - one wound 0 X - Complex Wound Assessment / Reassessment - multiple wounds 2 5 []  - Dermatologic / Skin Assessment (not related to wound area) 0 ASSESSMENTS - Focused Assessment []  - Circumferential Edema Measurements - multi extremities 0 []  - Nutritional Assessment / Counseling / Intervention 0 []  - Lower Extremity Assessment (monofilament, tuning fork, pulses) 0 []  - Peripheral Arterial Disease Assessment (using hand held doppler) 0 ASSESSMENTS - Ostomy and/or Continence Assessment and Care []  - Incontinence Assessment and Management 0 []  - Ostomy Care Assessment and Management (repouching, etc.) 0 PROCESS - Coordination of Care X - Simple Patient / Family Education for ongoing care 1 15 []  - Complex (extensive) Patient / Family Education for ongoing care 0 []  - Staff obtains Chiropractor, Records, Test Results / Process Orders 0 []  - Staff telephones HHA, Nursing Homes / Clarify orders / etc 0 []  - Routine Transfer to another Facility (non-emergent condition) 0 MYRLE, DUES ([id number]) []  - Routine Hospital Admission (non-emergent condition) 0 []  - New Admissions / Manufacturing engineer / Ordering NPWT, Apligraf, etc. 0 []  - Emergency Hospital Admission (emergent condition) 0 []  - Simple Discharge Coordination 0 []  - Complex (extensive) Discharge Coordination 0 PROCESS - Special Needs []  - Pediatric / Minor Patient Management 0 []  - Isolation Patient Management 0 []  - Hearing / Language / Visual special needs 0 []  - Assessment of Community assistance (transportation, D/C planning, etc.) 0 []  - Additional assistance / Altered mentation 0 []  - Support Surface(s) Assessment (bed, cushion, seat, etc.) 0 INTERVENTIONS - Wound  Cleansing / Measurement []  - Simple Wound Cleansing - one wound 0 X - Complex Wound Cleansing - multiple wounds 2 5 []  - Wound Imaging (photographs - any  number of wounds) 0 []  - Wound Tracing (instead of photographs) 0 []  - Simple Wound Measurement - one wound 0 X - Complex Wound Measurement - multiple wounds 2 5 INTERVENTIONS - Wound Dressings []  - Small Wound Dressing one or multiple wounds 0 X - Medium Wound Dressing one or multiple wounds 2 15 []  - Large Wound Dressing one or multiple wounds 0 []  - Application of Medications - topical 0 []  - Application of Medications - injection 0 INTERVENTIONS - Miscellaneous []  - External ear exam 0 [name], [name] ([id number]) []  - Specimen Collection (cultures, biopsies, blood, body fluids, etc.) 0 []  - Specimen(s) / Culture(s) sent or taken to Lab for analysis 0 []  - Patient Transfer (multiple staff / Michiel SitesHoyer Lift / Similar devices) 0 []  - Simple Staple / Suture removal (25 or less) 0 []  - Complex Staple / Suture removal (26 or more) 0 []  - Hypo / Hyperglycemic Management (close monitor of Blood Glucose) 0 []  - Ankle / Brachial Index (ABI) - do not check if billed separately 0 []  - Vital Signs 0 Has the patient been seen at the hospital within the last three years: Yes Total Score: 90 Level Of Care: New/Established - Level 3 Electronic Signature(s) Signed: 03/09/2016 4:42:05 PM By: Elpidio EricAfful, Rita BSN, RN Entered By: Elpidio EricAfful, Rita on 03/09/2016 16:09:27 [name] StackBLACKWELL, [name] ([id number]) -------------------------------------------------------------------------------- Encounter Discharge Information Details Patient Name: [name] StackBLACKWELL, [name] Date of Service: 03/09/2016 3:45 PM Medical Record Number: [id number] Patient Account Number: [id number] Date of Birth/Sex: Jun 04, 1946 15(70 y.o. Male) Treating RN: Clover MealyAfful, RN, BSN, [place] Sinkita Primary Care Physician: Other Clinician: Referring Physician: SELF, REFERRED Treating Physician/Extender: Elayne SnarePARKER,  PETER Weeks in Treatment: 7 Encounter Discharge Information Items Discharge Pain Level: 0 Discharge Condition: Stable Ambulatory Status: Ambulatory Discharge Destination: Home Private Transportation: Auto Accompanied By: wife Schedule Follow-up Appointment: No Medication Reconciliation completed and No provided to Patient/Care Kenner Lewan: Clinical Summary of Care: Electronic Signature(s) Signed: 03/09/2016 4:08:48 PM By: Elpidio EricAfful, Rita BSN, RN Entered By: Elpidio EricAfful, Rita on 03/09/2016 16:08:48 [name] StackBLACKWELL, [name] ([id number]) -------------------------------------------------------------------------------- Patient/Caregiver Education Details Patient Name: [name] StackBLACKWELL, [name] Date of Service: 03/09/2016 3:45 PM Medical Record Number: [id number] Patient Account Number: [id number] Date of Birth/Gender: Jun 04, 1946 8(69 y.o. Male) Treating RN: Clover MealyAfful, RN, BSN, [place] Primary Care Physician: Other Clinician: Referring Physician: SELF, REFERRED Treating Physician/Extender: Elayne SnarePARKER, PETER Weeks in Treatment: 7 Education Assessment Education Provided To: Patient Education Topics Provided [facility]: Methods: Explain/Verbal Responses: State content correctly Electronic Signature(s) Signed: 03/09/2016 4:42:05 PM By: Elpidio EricAfful, Rita BSN, RN Entered By: Elpidio EricAfful, Rita on 03/09/2016 16:08:34 [name] StackBLACKWELL, [name] ([id number]) -------------------------------------------------------------------------------- Wound Assessment Details Patient Name: [name] StackBLACKWELL, [name] Date of Service: 03/09/2016 3:45 PM Medical Record Number: [id number] Patient Account Number: [id number] Date of Birth/Sex: Jun 04, 1946 70(69 y.o. Male) Treating RN: Clover MealyAfful, RN, BSN, Psychologist, clinicalita Primary Care Physician: Other Clinician: Referring Physician: SELF, REFERRED Treating Physician/Extender: Elayne SnarePARKER, PETER Weeks in Treatment: 7 Wound Status Wound Number: 1 Primary Venous Leg Ulcer Etiology: Wound Location: Right Malleolus -  Lateral Wound Open Wounding Event: Gradually Appeared Status: Date Acquired: 08/11/2015 Comorbid Cataracts, Chronic Obstructive Weeks Of Treatment: 7 History: Pulmonary Disease (COPD), Clustered Wound: No Osteoarthritis Wound Measurements Length: (cm) 11 Width: (cm) 7.4 Depth: (cm) 0.3 Area: (cm) 63.931 Volume: (cm) 19.179 % Reduction in Area: -270% % Reduction in Volume: -270% Epithelialization: Small (1-33%) Tunneling: No Wound Description Full Thickness Without Exposed Classification: Support Structures Wound Margin: Distinct, outline attached Exudate Large Amount: Exudate Type: Serosanguineous Exudate Color: red, brown Foul Odor After  Cleansing: No Wound Bed Granulation Amount: None Present (0%) Exposed Structure Necrotic Amount: Large (67-100%) Fascia Exposed: No Necrotic Quality: Adherent Slough Fat Layer Exposed: No Tendon Exposed: No Muscle Exposed: No Joint Exposed: No Bone Exposed: No Limited to Skin Breakdown Periwound Skin Texture Texture Color No Abnormalities Noted: No No Abnormalities Noted: No Callus: No Atrophie Blanche: No [name], [name] ([id number]) Crepitus: No Cyanosis: No Excoriation: No Ecchymosis: No Fluctuance: No Erythema: No Friable: No Hemosiderin Staining: Yes Induration: No Mottled: No Localized Edema: No Pallor: No Rash: No Rubor: No Scarring: Yes Temperature / Pain Moisture Temperature: No Abnormality No Abnormalities Noted: No Dry / Scaly: No Maceration: Yes Moist: Yes Wound Preparation Ulcer Cleansing: Rinsed/Irrigated with Saline, Other: SUrg Scrub and water, Topical Anesthetic Applied: Other: Lidocaine 4%, Treatment Notes Wound #1 (Right, Lateral Malleolus) 1. Cleansed with: Other cleanser (specify in notes) 2. Anesthetic Topical Lidocaine 4% cream to wound bed prior to debridement 3. Peri-wound Care: Barrier cream Other peri-wound care (specify in notes) 4. Dressing Applied: Other  dressing (specify in notes) 5. Secondary Dressing Applied Guaze, ABD and kerlix/Conform Notes hibiclens to wash wound, RTD dressing applied, drawtex, extrasorb Electronic Signature(s) Signed: 03/09/2016 4:42:05 PM By: [name] BSN, RN Entered By: [name] on 03/09/2016 16:06:42 [name] ([id number]) -------------------------------------------------------------------------------- Wound Assessment Details Patient Name: [name] Date of Service: 03/09/2016 3:45 PM Medical Record Number: [id number] Patient Account Number: [id number] Date of Birth/Sex: Mar 23, 1946 (70 y.o. Male) Treating RN: [name], RN, BSN, Psychologist, clinical Primary Care Physician: Other Clinician: Referring Physician: SELF, REFERRED Treating Physician/Extender: Elayne Snare in Treatment: 7 Wound Status Wound Number: 2 Primary Arterial Insufficiency Ulcer Etiology: Wound Location: Right Malleolus - Medial Wound Open Wounding Event: Gradually Appeared Status: Date Acquired: 01/30/2016 Comorbid Cataracts, Chronic Obstructive Weeks Of Treatment: 5 History: Pulmonary Disease (COPD), Clustered Wound: Yes Osteoarthritis Wound Measurements Length: (cm) 6 Width: (cm) 3 Depth: (cm) 0.3 Area: (cm) 14.137 Volume: (cm) 4.241 % Reduction in Area: -22.5% % Reduction in Volume: -267.2% Epithelialization: Small (1-33%) Tunneling: No Undermining: No Wound Description Full Thickness Without Exposed Classification: Support Structures Wound Margin: Indistinct, nonvisible Exudate Large Amount: Exudate Type: Serosanguineous Exudate Color: red, brown Foul Odor After Cleansing: Yes Due to Product Use: No Wound Bed Granulation Amount: None Present (0%) Exposed Structure Necrotic Amount: Large (67-100%) Fascia Exposed: No Necrotic Quality: Adherent Slough Fat Layer Exposed: No Tendon Exposed: No Muscle Exposed: No Joint Exposed: No Bone Exposed: No Limited to Skin Breakdown Periwound Skin  Texture Texture Color No Abnormalities Noted: No No Abnormalities Noted: No Callus: No Atrophie Blanche: No [name], [name] ([id number]) Crepitus: No Cyanosis: No Excoriation: No Ecchymosis: No Fluctuance: No Erythema: No Friable: No Hemosiderin Staining: Yes Induration: No Mottled: No Localized Edema: No Pallor: No Rash: No Rubor: No Scarring: No Temperature / Pain Moisture Temperature: No Abnormality No Abnormalities Noted: No Dry / Scaly: No Maceration: Yes Moist: Yes Wound Preparation Ulcer Cleansing: Rinsed/Irrigated with Saline, Other: SUrg Scrub with water, Topical Anesthetic Applied: Other: lidocaine 4%, Treatment Notes Wound #2 (Right, Medial Malleolus) 1. Cleansed with: Other cleanser (specify in notes) 2. Anesthetic Topical Lidocaine 4% cream to wound bed prior to debridement 3. Peri-wound Care: Barrier cream Other peri-wound care (specify in notes) 4. Dressing Applied: Other dressing (specify in notes) 5. Secondary Dressing Applied Guaze, ABD and kerlix/Conform Notes hibiclens to wash wound, RTD dressing applied, drawtex, extrasorb Electronic Signature(s) Signed: 03/09/2016 4:42:05 PM By: [name] BSN, RN Entered By: [name] on 03/09/2016 16:07:03

## 2016-03-13 ENCOUNTER — Encounter: Payer: Medicare HMO | Admitting: Internal Medicine

## 2016-03-13 DIAGNOSIS — I87331 Chronic venous hypertension (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: Secondary | ICD-10-CM | POA: Diagnosis not present

## 2016-03-14 LAB — AEROBIC CULTURE W GRAM STAIN (SUPERFICIAL SPECIMEN): Gram Stain: NONE SEEN

## 2016-03-14 LAB — AEROBIC CULTURE  (SUPERFICIAL SPECIMEN)

## 2016-03-15 NOTE — Progress Notes (Signed)
[name], [name] ([id number]) Visit Report for 03/13/2016 Arrival Information Details [name], [name] Date of Service: 03/13/2016 12:45 PM Patient Name: C. Patient Account Number: [id number] Medical Record Treating RN: [name] RN, BSN, [place] Sink [id number] Number: Other Clinician: Date of Birth/Sex: 08-Jun-1946 (70 y.o. Male) Treating [name], [name] Primary Care Physician: Physician/Extender: G Referring Physician: SELF, REFERRED Weeks in Treatment: 8 Visit Information History Since Last Visit Added or deleted any medications: No Patient Arrived: Ambulatory Any new allergies or adverse reactions: No Arrival Time: 12:49 Had a fall or experienced change in No Accompanied By: self activities of daily living that may affect Transfer Assistance: None risk of falls: Patient Identification Verified: Yes Signs or symptoms of abuse/neglect since last No Secondary Verification Process Yes visito Completed: Hospitalized since last visit: No Patient Requires Transmission- No Has Dressing in Place as Prescribed: Yes Based Precautions: Pain Present Now: No Patient Has Alerts: Yes Patient Alerts: Patient on Blood Thinner Electronic Signature(s) Signed: 03/13/2016 4:14:50 PM By: [name] BSN, RN Entered By: [name] on 03/13/2016 12:49:44 [name] ([id number]) -------------------------------------------------------------------------------- Encounter Discharge Information Details [name] Date of Service: 03/13/2016 12:45 PM Patient Name: C. Patient Account Number: [id number] Medical Record Treating RN: [name] RN, BSN, [place] Sink [id number] Number: Other Clinician: Date of Birth/Sex: 1946-08-15 (70 y.o. Male) Treating [name], [name] Primary Care Physician: Physician/Extender: G Referring Physician: SELF, REFERRED Weeks in Treatment: 8 Encounter Discharge Information Items Discharge Pain Level: 0 Discharge Condition: Stable Ambulatory Status: Ambulatory Discharge  Destination: Home Private Transportation: Auto Accompanied By: self Schedule Follow-up Appointment: No Medication Reconciliation completed and No provided to Patient/Care Dona Klemann: Clinical Summary of Care: Electronic Signature(s) Signed: 03/13/2016 4:14:50 PM By: [name] BSN, RN Previous Signature: 03/13/2016 1:31:33 PM Version By: Gwenlyn Perking Entered By: [name] on 03/13/2016 13:34:53 [name] ([id number]) -------------------------------------------------------------------------------- Lower Extremity Assessment Details [name] Date of Service: 03/13/2016 12:45 PM Patient Name: C. Patient Account Number: [id number] Medical Record Treating RN: [name] RN, BSN, [place] Sink [id number] Number: Other Clinician: Date of Birth/Sex: June 03, 1946 (70 y.o. Male) Treating [name], [name] Primary Care Physician: Physician/Extender: G Referring Physician: SELF, REFERRED Weeks in Treatment: 8 Edema Assessment Assessed: [Left: No] [Right: No] Edema: [Left: N] [Right: o] Calf Left: Right: Point of Measurement: 40 cm From Medial Instep cm cm Ankle Left: Right: Point of Measurement: 12 cm From Medial Instep cm cm Vascular Assessment Pulses: Posterior Tibial Dorsalis Pedis Palpable: [Right:Yes] Extremity colors, hair growth, and conditions: Extremity Color: [Right:Mottled] Hair Growth on Extremity: [Right:Yes] Temperature of Extremity: [Right:Warm] Capillary Refill: [Right:< 3 seconds] Toe Nail Assessment Left: Right: Thick: Yes Discolored: Yes Deformed: No Improper Length and Hygiene: No Electronic Signature(s) Signed: 03/13/2016 4:14:50 PM By: [name] BSN, RN Entered By: [name] on 03/13/2016 12:56:30 [name] ([id number]) [name], [name] ([id number]) -------------------------------------------------------------------------------- Multi Wound Chart Details [name] Date of Service: 03/13/2016 12:45 PM Patient Name: C.  Patient Account Number: [id number] Medical Record Treating RN: [name] RN, BSN,  Sink [id number] Number: Other Clinician: Date of Birth/Sex: May 29, 1946 (70 y.o. Male) Treating [name], [name] Primary Care Physician: Physician/Extender: G Referring Physician: SELF, REFERRED Weeks in Treatment: 8 Vital Signs Height(in): 76 Pulse(bpm): 82 Weight(lbs): 238 Blood Pressure 122/52 (mmHg): Body Mass Index(BMI): 29 Temperature(F): 98.7 Respiratory Rate 17 (breaths/min): Photos: [1:No Photos] [2:No Photos] [N/A:N/A] Wound Location: [1:Right Malleolus - Lateral] [2:Right Malleolus - Medial N/A] Wounding Event: [1:Gradually Appeared] [2:Gradually Appeared] [N/A:N/A] Primary Etiology: [1:Venous Leg Ulcer] [2:Arterial Insufficiency Ulcer N/A] Comorbid History: [1:Cataracts, Chronic Obstructive Pulmonary Disease (COPD), Osteoarthritis] [2:Cataracts, Chronic Obstructive Pulmonary  Disease (COPD), Osteoarthritis] [N/A:N/A] Date Acquired: [1:08/11/2015] [2:01/30/2016] [N/A:N/A] Weeks of Treatment: [1:8] [2:6] [N/A:N/A] Wound Status: [1:Open] [2:Open] [N/A:N/A] Clustered Wound: [1:No] [2:Yes] [N/A:N/A] Measurements L x W x D 9x4x0.2 [2:6x4x0.2] [N/A:N/A] (cm) Area (cm) : [1:28.274] [2:18.85] [N/A:N/A] Volume (cm) : [1:5.655] [2:3.77] [N/A:N/A] % Reduction in Area: [1:-63.60%] [2:-63.30%] [N/A:N/A] % Reduction in Volume: -9.10% [2:-226.40%] [N/A:N/A] Classification: [1:Full Thickness Without Exposed Support Structures] [2:Full Thickness Without Exposed Support Structures] [N/A:N/A] Exudate Amount: [1:Large] [2:Large] [N/A:N/A] Exudate Type: [1:Serosanguineous] [2:Serosanguineous] [N/A:N/A] Exudate Color: [1:red, brown] [2:red, brown] [N/A:N/A] Wound Margin: [1:Distinct, outline attached] [2:Indistinct, nonvisible] [N/A:N/A] Granulation Amount: [1:Small (1-33%)] [2:Small (1-33%)] [N/A:N/A] Granulation Quality: [1:Pink] [2:Pink] [N/A:N/A] Necrotic Amount: [1:Large (67-100%)] [2:Large (67-100%)]  [N/A:N/A] Exposed Structures: Fascia: No Fascia: No N/A Fat: No Fat: No Tendon: No Tendon: No Muscle: No Muscle: No Joint: No Joint: No Bone: No Bone: No Limited to Skin Limited to Skin Breakdown Breakdown Epithelialization: Small (1-33%) Small (1-33%) N/A Periwound Skin Texture: Scarring: Yes Edema: No N/A Edema: No Excoriation: No Excoriation: No Induration: No Induration: No Callus: No Callus: No Crepitus: No Crepitus: No Fluctuance: No Fluctuance: No Friable: No Friable: No Rash: No Rash: No Scarring: No Periwound Skin Maceration: Yes Maceration: Yes N/A Moisture: Moist: Yes Moist: Yes Dry/Scaly: No Dry/Scaly: No Periwound Skin Color: Hemosiderin Staining: Yes Hemosiderin Staining: Yes N/A Atrophie Blanche: No Atrophie Blanche: No Cyanosis: No Cyanosis: No Ecchymosis: No Ecchymosis: No Erythema: No Erythema: No Mottled: No Mottled: No Pallor: No Pallor: No Rubor: No Rubor: No Temperature: No Abnormality No Abnormality N/A Tenderness on No No N/A Palpation: Wound Preparation: Ulcer Cleansing: Ulcer Cleansing: N/A Rinsed/Irrigated with Rinsed/Irrigated with Saline, Other: SUrg Scrub Saline, Other: SUrg Scrub and water with water Topical Anesthetic Topical Anesthetic Applied: Other: Lidocaine Applied: Other: lidocaine 4% 4% Treatment Notes Electronic Signature(s) Signed: 03/13/2016 1:03:12 PM By: Elpidio EricAfful, Rita BSN, RN Entered By: Elpidio EricAfful, Rita on 03/13/2016 13:03:12 [name] StackBLACKWELL, [name] ([id number]) -------------------------------------------------------------------------------- Multi-Disciplinary Care Plan Details [name] RingerBLACKWELL, [name] Date of Service: 03/13/2016 12:45 PM Patient Name: C. Patient Account Number: [id number] Medical Record Treating RN: Clover Mealyfful, RN, BSN, [place] SinkRita [id number] Number: Other Clinician: Date of Birth/Sex: [birth year](69 y.o. Male) Treating [name], [name] Primary Care Physician: Physician/Extender: G Referring Physician:  SELF, REFERRED Weeks in Treatment: 8 Active Inactive Orientation to the Wound Care Program Nursing Diagnoses: Knowledge deficit related to the wound healing center program Goals: Patient/caregiver will verbalize understanding of the Wound Healing Center Program Date Initiated: 01/17/2016 Goal Status: Active Interventions: Provide education on orientation to the wound center Notes: Wound/Skin Impairment Nursing Diagnoses: Impaired tissue integrity Goals: Ulcer/skin breakdown will heal within 14 weeks Date Initiated: 01/17/2016 Goal Status: Active Interventions: Assess ulceration(s) every visit Notes: Electronic Signature(s) Signed: 03/13/2016 1:02:54 PM By: Elpidio EricAfful, Rita BSN, RN Entered By: Elpidio EricAfful, Rita on 03/13/2016 13:02:54 [name] StackBLACKWELL, Mario C. ([id number]) -------------------------------------------------------------------------------- Pain Assessment Details [name] RingerBLACKWELL, [name] Date of Service: 03/13/2016 12:45 PM Patient Name: C. Patient Account Number: [id number] Medical Record Treating RN: Clover Mealyfful, RN, BSN, [place] SinkRita [id number] Number: Other Clinician: Date of Birth/Sex: [birth year](69 y.o. Male) Treating [name], [name] Primary Care Physician: Physician/Extender: G Referring Physician: SELF, REFERRED Weeks in Treatment: 8 Active Problems Location of Pain Severity and Description of Pain Patient Has Paino No Site Locations With Dressing Change: No Pain Management and Medication Current Pain Management: Electronic Signature(s) Signed: 03/13/2016 4:14:50 PM By: Elpidio EricAfful, Rita BSN, RN Entered By: Elpidio EricAfful, Rita on 03/13/2016 12:49:52 [name] StackBLACKWELL, [name] ([id number]) -------------------------------------------------------------------------------- Patient/Caregiver Education Details [name] RingerBLACKWELL, [name] Date of Service: 03/13/2016 12:45 PM Patient Name: C. Patient Account Number:  [id number] Medical Record Treating RN: [name] RN, BSN, [place] Sink [id number] Number: Other Clinician: Date of  Birth/Gender: 01/22/1946 (70 y.o. Male) Treating [name], [name] Primary Care Physician: Physician/Extender: G Referring Physician: SELF, REFERRED Weeks in Treatment: 8 Education Assessment Education Provided To: Patient Education Topics Provided Welcome To The Wound Care Center: Methods: Explain/Verbal Responses: State content correctly Electronic Signature(s) Signed: 03/13/2016 4:14:50 PM By: [name] BSN, RN Entered By: [name] on 03/13/2016 13:35:04 [name] ([id number]) -------------------------------------------------------------------------------- Wound Assessment Details [name] Date of Service: 03/13/2016 12:45 PM Patient Name: C. Patient Account Number: [id number] Medical Record Treating RN: [name], RN, BSN, [place] Sink [id number] Number: Other Clinician: Date of Birth/Sex: Feb 25, 1946 (70 y.o. Male) Treating [name], [name] Primary Care Physician: Physician/Extender: G Referring Physician: SELF, REFERRED Weeks in Treatment: 8 Wound Status Wound Number: 1 Primary Venous Leg Ulcer Etiology: Wound Location: Right Malleolus - Lateral Wound Open Wounding Event: Gradually Appeared Status: Date Acquired: 08/11/2015 Comorbid Cataracts, Chronic Obstructive Weeks Of Treatment: 8 History: Pulmonary Disease (COPD), Clustered Wound: No Osteoarthritis Photos Wound Measurements Length: (cm) 9 Width: (cm) 4 Depth: (cm) 0.2 Area: (cm) 28.274 Volume: (cm) 5.655 % Reduction in Area: -63.6% % Reduction in Volume: -9.1% Epithelialization: Small (1-33%) Tunneling: No Undermining: No Wound Description Full Thickness Without Exposed Foul Odor Aft Classification: Support Structures Wound Margin: Distinct, outline attached Exudate Large Amount: Exudate Type: Serosanguineous Exudate Color: red, brown er Cleansing: No Wound Bed Granulation Amount: Small (1-33%) Exposed Structure Allis, [name] ([id number]) Granulation Quality: Pink Fascia  Exposed: No Necrotic Amount: Large (67-100%) Fat Layer Exposed: No Necrotic Quality: Adherent Slough Tendon Exposed: No Muscle Exposed: No Joint Exposed: No Bone Exposed: No Limited to Skin Breakdown Periwound Skin Texture Texture Color No Abnormalities Noted: No No Abnormalities Noted: No Callus: No Atrophie Blanche: No Crepitus: No Cyanosis: No Excoriation: No Ecchymosis: No Fluctuance: No Erythema: No Friable: No Hemosiderin Staining: Yes Induration: No Mottled: No Localized Edema: No Pallor: No Rash: No Rubor: No Scarring: Yes Temperature / Pain Moisture Temperature: No Abnormality No Abnormalities Noted: No Dry / Scaly: No Maceration: Yes Moist: Yes Wound Preparation Ulcer Cleansing: Rinsed/Irrigated with Saline, Other: SUrg Scrub and water, Topical Anesthetic Applied: Other: Lidocaine 4%, Treatment Notes Wound #1 (Right, Lateral Malleolus) 1. Cleansed with: Cleanse wound with antibacterial soap and water 4. Dressing Applied: Iodoflex 5. Secondary Dressing Applied Guaze, ABD and kerlix/Conform 7. Secured with Secretary/administrator) Signed: 03/13/2016 4:14:50 PM By: [name] BSN, RN Entered By: [name] on 03/13/2016 16:13:24 [name] ([id number]) -------------------------------------------------------------------------------- Wound Assessment Details [name] Date of Service: 03/13/2016 12:45 PM Patient Name: C. Patient Account Number: [id number] Medical Record Treating RN: [name], RN, BSN, [place] Sink [id number] Number: Other Clinician: Date of Birth/Sex: Jan 16, 1946 (70 y.o. Male) Treating [name], [name] Primary Care Physician: Physician/Extender: G Referring Physician: SELF, REFERRED Weeks in Treatment: 8 Wound Status Wound Number: 2 Primary Arterial Insufficiency Ulcer Etiology: Wound Location: Right Malleolus - Medial Wound Open Wounding Event: Gradually Appeared Status: Date Acquired: 01/30/2016 Comorbid  Cataracts, Chronic Obstructive Weeks Of Treatment: 6 History: Pulmonary Disease (COPD), Clustered Wound: Yes Osteoarthritis Photos Wound Measurements Length: (cm) 6 Width: (cm) 4 Depth: (cm) 0.2 Area: (cm) 18.85 Volume: (cm) 3.77 % Reduction in Area: -63.3% % Reduction in Volume: -226.4% Epithelialization: Small (1-33%) Wound Description Full Thickness Without Exposed Foul Odor Aft Classification: Support Structures Wound Margin: Indistinct, nonvisible [name], [name] ([id number]) er Cleansing: No Exudate Large Amount: Exudate Type: Serosanguineous Exudate Color: red, brown Wound Bed Granulation Amount: Small (1-33%) Exposed Structure Granulation  Quality: Pink Fascia Exposed: No Necrotic Amount: Large (67-100%) Fat Layer Exposed: No Necrotic Quality: Adherent Slough Tendon Exposed: No Muscle Exposed: No Joint Exposed: No Bone Exposed: No Limited to Skin Breakdown Periwound Skin Texture Texture Color No Abnormalities Noted: No No Abnormalities Noted: No Callus: No Atrophie Blanche: No Crepitus: No Cyanosis: No Excoriation: No Ecchymosis: No Fluctuance: No Erythema: No Friable: No Hemosiderin Staining: Yes Induration: No Mottled: No Localized Edema: No Pallor: No Rash: No Rubor: No Scarring: No Temperature / Pain Moisture Temperature: No Abnormality No Abnormalities Noted: No Dry / Scaly: No Maceration: Yes Moist: Yes Wound Preparation Ulcer Cleansing: Rinsed/Irrigated with Saline, Other: SUrg Scrub with water, Topical Anesthetic Applied: Other: lidocaine 4%, Treatment Notes Wound #2 (Right, Medial Malleolus) 1. Cleansed with: Cleanse wound with antibacterial soap and water 4. Dressing Applied: Iodoflex 5. Secondary Dressing Applied Guaze, ABD and kerlix/Conform 7. Secured with FADY, STAMPS ([id number]) Tape Electronic Signature(s) Signed: 03/13/2016 4:14:50 PM By: [name] BSN, RN Entered By: [name] on 03/13/2016  16:12:56 [name] ([id number]) -------------------------------------------------------------------------------- Vitals Details [name] Date of Service: 03/13/2016 12:45 PM Patient Name: C. Patient Account Number: [id number] Medical Record Treating RN: [name], RN, BSN, [place] Sink [id number] Number: Other Clinician: Date of Birth/Sex: February 03, 1946 (70 y.o. Male) Treating [name], [name] Primary Care Physician: Physician/Extender: G Referring Physician: SELF, REFERRED Weeks in Treatment: 8 Vital Signs Time Taken: 12:50 Temperature (F): 98.7 Height (in): 76 Pulse (bpm): 82 Weight (lbs): 238 Respiratory Rate (breaths/min): 17 Body Mass Index (BMI): 29 Blood Pressure (mmHg): 122/52 Reference Range: 80 - 120 mg / dl Electronic Signature(s) Signed: 03/13/2016 4:14:50 PM By: [name] BSN, RN Entered By: [name] on 03/13/2016 12:50:45
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
## 2016-03-16 DIAGNOSIS — I87331 Chronic venous hypertension (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: Secondary | ICD-10-CM | POA: Diagnosis not present

## 2016-03-17 NOTE — Progress Notes (Signed)
RANFERI, CLINGAN ([id number]) Visit Report for 03/16/2016 Arrival Information Details Patient Name: [name], [name]. Date of Service: 03/16/2016 1:30 PM Medical Record Number: [id number] Patient Account Number: [id number] Date of Birth/Sex: [birth year] (70 y.o. Male) Treating RN: [name], RN, BSN, [place] Sink Primary Care Physician: Other Clinician: Referring Physician: SELF, REFERRED Treating Physician/Extender: Elayne Snare in Treatment: 8 Visit Information History Since Last Visit Added or deleted any medications: No Patient Arrived: Ambulatory Any new allergies or adverse reactions: No Arrival Time: 13:30 Had a fall or experienced change in No Accompanied By: wife activities of daily living that may affect Transfer Assistance: None risk of falls: Patient Identification Verified: Yes Signs or symptoms of abuse/neglect since last No Patient Requires Transmission- No visito Based Precautions: Hospitalized since last visit: No Patient Has Alerts: Yes Has Dressing in Place as Prescribed: Yes Patient Alerts: Patient on Blood Pain Present Now: No Thinner Electronic Signature(s) Signed: 03/16/2016 2:54:06 PM By: [name] BSN, RN Entered By: [name] on 03/16/2016 13:31:36 [name] ([id number]) -------------------------------------------------------------------------------- Clinic Level of Care Assessment Details Patient Name: [name] Date of Service: 03/16/2016 1:30 PM Medical Record Number: [id number] Patient Account Number: [id number] Date of Birth/Sex: 11-Nov-1945 (70 y.o. Male) Treating RN: [name], RN, BSN, Psychologist, clinical Primary Care Physician: Other Clinician: Referring Physician: SELF, REFERRED Treating Physician/Extender: Elayne Snare in Treatment: 8 Clinic Level of Care Assessment Items TOOL 4 Quantity Score []  - Use when only an EandM is performed on FOLLOW-UP visit 0 ASSESSMENTS - Nursing Assessment / Reassessment X - Reassessment of  Co-morbidities (includes updates in patient status) 1 10 X - Reassessment of Adherence to Treatment Plan 1 5 ASSESSMENTS - Wound and Skin Assessment / Reassessment []  - Simple Wound Assessment / Reassessment - one wound 0 X - Complex Wound Assessment / Reassessment - multiple wounds 2 5 []  - Dermatologic / Skin Assessment (not related to wound area) 0 ASSESSMENTS - Focused Assessment []  - Circumferential Edema Measurements - multi extremities 0 []  - Nutritional Assessment / Counseling / Intervention 0 []  - Lower Extremity Assessment (monofilament, tuning fork, pulses) 0 []  - Peripheral Arterial Disease Assessment (using hand held doppler) 0 ASSESSMENTS - Ostomy and/or Continence Assessment and Care []  - Incontinence Assessment and Management 0 []  - Ostomy Care Assessment and Management (repouching, etc.) 0 PROCESS - Coordination of Care X - Simple Patient / Family Education for ongoing care 1 15 []  - Complex (extensive) Patient / Family Education for ongoing care 0 []  - Staff obtains Chiropractor, Records, Test Results / Process Orders 0 []  - Staff telephones HHA, Nursing Homes / Clarify orders / etc 0 []  - Routine Transfer to another Facility (non-emergent condition) 0 [name], [name] ([id number]) []  - Routine Hospital Admission (non-emergent condition) 0 []  - New Admissions / Manufacturing engineer / Ordering NPWT, Apligraf, etc. 0 []  - Emergency Hospital Admission (emergent condition) 0 []  - Simple Discharge Coordination 0 []  - Complex (extensive) Discharge Coordination 0 PROCESS - Special Needs []  - Pediatric / Minor Patient Management 0 []  - Isolation Patient Management 0 []  - Hearing / Language / Visual special needs 0 []  - Assessment of Community assistance (transportation, D/C planning, etc.) 0 []  - Additional assistance / Altered mentation 0 []  - Support Surface(s) Assessment (bed, cushion, seat, etc.) 0 INTERVENTIONS - Wound Cleansing / Measurement []  - Simple Wound  Cleansing - one wound 0 X - Complex Wound Cleansing - multiple wounds 2 5 []  - Wound Imaging (photographs - any number of wounds) 0 []  -  Wound Tracing (instead of photographs) 0 []  - Simple Wound Measurement - one wound 0 X - Complex Wound Measurement - multiple wounds 2 5 INTERVENTIONS - Wound Dressings []  - Small Wound Dressing one or multiple wounds 0 X - Medium Wound Dressing one or multiple wounds 2 15 []  - Large Wound Dressing one or multiple wounds 0 []  - Application of Medications - topical 0 []  - Application of Medications - injection 0 INTERVENTIONS - Miscellaneous []  - External ear exam 0 [name], [name] ([id number]) []  - Specimen Collection (cultures, biopsies, blood, body fluids, etc.) 0 []  - Specimen(s) / Culture(s) sent or taken to Lab for analysis 0 []  - Patient Transfer (multiple staff / Michiel Sites Lift / Similar devices) 0 []  - Simple Staple / Suture removal (25 or less) 0 []  - Complex Staple / Suture removal (26 or more) 0 []  - Hypo / Hyperglycemic Management (close monitor of Blood Glucose) 0 []  - Ankle / Brachial Index (ABI) - do not check if billed separately 0 []  - Vital Signs 0 Has the patient been seen at the hospital within the last three years: Yes Total Score: 90 Level Of Care: New/Established - Level 3 Electronic Signature(s) Signed: 03/16/2016 2:54:06 PM By: [name] BSN, RN Entered By: [name] on 03/16/2016 13:52:54 [name] ([id number]) -------------------------------------------------------------------------------- Encounter Discharge Information Details Patient Name: [name] Date of Service: 03/16/2016 1:30 PM Medical Record Number: [id number] Patient Account Number: [id number] Date of Birth/Sex: 02/20/1946 (70 y.o. Male) Treating RN: [name], RN, BSN, [place] Sink Primary Care Physician: Other Clinician: Referring Physician: SELF, REFERRED Treating Physician/Extender: Elayne Snare in Treatment: 8 Encounter Discharge  Information Items Discharge Pain Level: 0 Discharge Condition: Stable Ambulatory Status: Ambulatory Discharge Destination: Home Private Transportation: Auto Accompanied By: wife Schedule Follow-up Appointment: No Medication Reconciliation completed and No provided to Patient/Care [name]: Clinical Summary of Care: Electronic Signature(s) Signed: 03/16/2016 2:54:06 PM By: [name] BSN, RN Entered By: [name] on 03/16/2016 13:52:02 [name] ([id number]) -------------------------------------------------------------------------------- Patient/Caregiver Education Details Patient Name: [name] Date of Service: 03/16/2016 1:30 PM Medical Record Number: [id number] Patient Account Number: [id number] Date of Birth/Gender: 09/23/1945 (70 y.o. Male) Treating RN: [name], RN, BSN, [place] Sink Primary Care Physician: Other Clinician: Referring Physician: SELF, REFERRED Treating Physician/Extender: Elayne Snare in Treatment: 8 Education Assessment Education Provided To: Patient Education Topics Provided [facility]: Methods: Explain/Verbal Responses: State content correctly Electronic Signature(s) Signed: 03/16/2016 2:54:06 PM By: [name] BSN, RN Entered By: [name] on 03/16/2016 13:51:40 [name] ([id number]) -------------------------------------------------------------------------------- Wound Assessment Details Patient Name: [name] Date of Service: 03/16/2016 1:30 PM Medical Record Number: [id number] Patient Account Number: [id number] Date of Birth/Sex: [birth year] (69 y.o. Male) Treating RN: [name], RN, BSN, [place] Primary Care Physician: Other Clinician: Referring Physician: SELF, REFERRED Treating Physician/Extender: Ardath Sax Weeks in Treatment: 8 Wound Status Wound Number: 1 Primary Venous Leg Ulcer Etiology: Wound Location: Right Malleolus - Lateral Wound Open Wounding Event: Gradually  Appeared Status: Date Acquired: 08/11/2015 Comorbid Cataracts, Chronic Obstructive Weeks Of Treatment: 8 History: Pulmonary Disease (COPD), Clustered Wound: No Osteoarthritis Wound Measurements Length: (cm) 9 Width: (cm) 4 Depth: (cm) 0.2 Area: (cm) 28.274 Volume: (cm) 5.655 % Reduction in Area: -63.6% % Reduction in Volume: -9.1% Epithelialization: Small (1-33%) Tunneling: No Undermining: No Wound Description Full Thickness Without Exposed Classification: Support Structures Wound Margin: Distinct, outline attached Exudate Large Amount: Exudate Type: Serosanguineous Exudate Color: red, brown Foul Odor After Cleansing: No Wound Bed  Granulation Amount: Small (1-33%) Exposed Structure Granulation Quality: Pink Fascia Exposed: No Necrotic Amount: Large (67-100%) Fat Layer Exposed: No Necrotic Quality: Adherent Slough Tendon Exposed: No Muscle Exposed: No Joint Exposed: No Bone Exposed: No Limited to Skin Breakdown Periwound Skin Texture Texture Color No Abnormalities Noted: No No Abnormalities Noted: No Callus: No Atrophie Blanche: No [name], TREBILCOCK. ([id number]) Crepitus: No Cyanosis: No Excoriation: No Ecchymosis: No Fluctuance: No Erythema: No Friable: No Hemosiderin Staining: Yes Induration: No Mottled: No Localized Edema: No Pallor: No Rash: No Rubor: No Scarring: Yes Temperature / Pain Moisture Temperature: No Abnormality No Abnormalities Noted: No Tenderness on Palpation: Yes Dry / Scaly: No Maceration: Yes Moist: Yes Wound Preparation Ulcer Cleansing: Rinsed/Irrigated with Saline, Other: SUrg Scrub and water, Topical Anesthetic Applied: Other: Lidocaine 4%, Treatment Notes Wound #1 (Right, Lateral Malleolus) 1. Cleansed with: Cleanse wound with antibacterial soap and water 4. Dressing Applied: Iodoflex 5. Secondary Dressing Applied ABD Pad Guaze, ABD and kerlix/Conform 7. Secured with Music therapist) Signed: 03/16/2016 2:54:06 PM By: [name] BSN, RN Entered By: [name] on 03/16/2016 13:50:36 [name] ([id number]) -------------------------------------------------------------------------------- Wound Assessment Details Patient Name: [name] Date of Service: 03/16/2016 1:30 PM Medical Record Number: [id number] Patient Account Number: [id number] Date of Birth/Sex: [birth year] (70 y.o. Male) Treating RN: [name], RN, BSN, [place] Primary Care Physician: Other Clinician: Referring Physician: SELF, REFERRED Treating Physician/Extender: Ardath Sax Weeks in Treatment: 8 Wound Status Wound Number: 2 Primary Arterial Insufficiency Ulcer Etiology: Wound Location: Right Malleolus - Medial Wound Open Wounding Event: Gradually Appeared Status: Date Acquired: 01/30/2016 Comorbid Cataracts, Chronic Obstructive Weeks Of Treatment: 6 History: Pulmonary Disease (COPD), Clustered Wound: Yes Osteoarthritis Wound Measurements Length: (cm) 6 Width: (cm) 4 Depth: (cm) 0.2 Area: (cm) 18.85 Volume: (cm) 3.77 % Reduction in Area: -63.3% % Reduction in Volume: -226.4% Epithelialization: Small (1-33%) Tunneling: No Undermining: No Wound Description Full Thickness Without Exposed Classification: Support Structures Wound Margin: Indistinct, nonvisible Exudate Large Amount: Exudate Type: Serosanguineous Exudate Color: red, brown Foul Odor After Cleansing: No Wound Bed Granulation Amount: Small (1-33%) Exposed Structure Granulation Quality: Pink Fascia Exposed: No Necrotic Amount: Large (67-100%) Fat Layer Exposed: No Necrotic Quality: Adherent Slough Tendon Exposed: No Muscle Exposed: No Joint Exposed: No Bone Exposed: No Limited to Skin Breakdown Periwound Skin Texture Texture Color No Abnormalities Noted: No No Abnormalities Noted: No Callus: No Atrophie Blanche: No [name], [name] ([id number]) Crepitus: No Cyanosis:  No Excoriation: No Ecchymosis: No Fluctuance: No Erythema: No Friable: No Hemosiderin Staining: Yes Induration: No Mottled: No Localized Edema: No Pallor: No Rash: No Rubor: No Scarring: No Temperature / Pain Moisture Temperature: No Abnormality No Abnormalities Noted: No Tenderness on Palpation: Yes Dry / Scaly: No Maceration: Yes Moist: Yes Wound Preparation Ulcer Cleansing: Rinsed/Irrigated with Saline, Other: SUrg Scrub with water, Topical Anesthetic Applied: Other: lidocaine 4%, Treatment Notes Wound #2 (Right, Medial Malleolus) 1. Cleansed with: Cleanse wound with antibacterial soap and water 4. Dressing Applied: Iodoflex 5. Secondary Dressing Applied ABD Pad Guaze, ABD and kerlix/Conform 7. Secured with Secretary/administrator) Signed: 03/16/2016 2:54:06 PM By: [name] BSN, RN Entered By: [name] on 03/16/2016 13:50:51

## 2016-03-20 ENCOUNTER — Encounter: Payer: Medicare HMO | Admitting: Internal Medicine

## 2016-03-20 DIAGNOSIS — I87331 Chronic venous hypertension (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: Secondary | ICD-10-CM | POA: Diagnosis not present

## 2016-03-21 NOTE — Progress Notes (Signed)
GLEAN, SIRACUSE ([id number]) Visit Report for 03/20/2016 Arrival Information Details ARINZE, WOLTER Date of Service: 03/20/2016 10:45 AM Patient Name: C. Patient Account Number: [id number] Medical Record Treating RN: [name] RN, BSN, [place] Sink [id number] Number: Other Clinician: Date of Birth/Sex: 03-17-1946 (70 y.o. Male) Treating [name], [name] Primary Care Physician: Physician/Extender: G Referring Physician: SELF, REFERRED Weeks in Treatment: 9 Visit Information History Since Last Visit Added or deleted any medications: No Patient Arrived: Ambulatory Any new allergies or adverse reactions: No Arrival Time: 10:44 Had a fall or experienced change in No Accompanied By: self activities of daily living that may affect Transfer Assistance: None risk of falls: Patient Identification Verified: Yes Signs or symptoms of abuse/neglect since last No Secondary Verification Process Yes visito Completed: Has Dressing in Place as Prescribed: Yes Patient Requires Transmission- No Pain Present Now: No Based Precautions: Patient Has Alerts: Yes Patient Alerts: Patient on Blood Thinner Electronic Signature(s) Signed: 03/20/2016 4:09:58 PM By: [name] BSN, RN Entered By: [name] on 03/20/2016 10:45:02 [name] ([id number]) -------------------------------------------------------------------------------- Encounter Discharge Information Details [name] Date of Service: 03/20/2016 10:45 AM Patient Name: C. Patient Account Number: [id number] Medical Record Treating RN: [name] RN, BSN, [place] Sink [id number] Number: Other Clinician: Date of Birth/Sex: 07-01-1946 (70 y.o. Male) Treating [name], [name] Primary Care Physician: Physician/Extender: G Referring Physician: SELF, REFERRED Weeks in Treatment: 9 Encounter Discharge Information Items Discharge Pain Level: 0 Discharge Condition: Stable Ambulatory Status: Ambulatory Discharge Destination: Home Transportation:  Private Auto Accompanied By: self Schedule Follow-up Appointment: No Medication Reconciliation completed and provided to Patient/Care No Shanay Woolman: Provided on Clinical Summary of Care: 03/20/2016 Form Type Recipient Paper Patient LB Electronic Signature(s) Signed: 03/20/2016 11:37:37 AM By: Gwenlyn Perking Entered By: Gwenlyn Perking on 03/20/2016 11:37:36 [name] ([id number]) -------------------------------------------------------------------------------- Lower Extremity Assessment Details [name] Date of Service: 03/20/2016 10:45 AM Patient Name: C. Patient Account Number: [id number] Medical Record Treating RN: [name] RN, BSN, [place] Sink [id number] Number: Other Clinician: Date of Birth/Sex: 30-Jul-1946 (70 y.o. Male) Treating [name], [name] Primary Care Physician: Physician/Extender: G Referring Physician: SELF, REFERRED Weeks in Treatment: 9 Edema Assessment Assessed: [Left: No] [Right: No] Edema: [Left: N] [Right: o] Calf Left: Right: Point of Measurement: 40 cm From Medial Instep cm cm Ankle Left: Right: Point of Measurement: 12 cm From Medial Instep cm cm Vascular Assessment Claudication: Claudication Assessment [Right:None] Pulses: Posterior Tibial Dorsalis Pedis Palpable: [Right:Yes] Extremity colors, hair growth, and conditions: Extremity Color: [Right:Dusky] Hair Growth on Extremity: [Right:Yes] Temperature of Extremity: [Right:Warm] Capillary Refill: [Right:< 3 seconds] Toe Nail Assessment Left: Right: Thick: Yes Discolored: Yes Deformed: No Improper Length and Hygiene: Yes Electronic Signature(s) Signed: 03/20/2016 4:09:58 PM By: [name] BSN, RN [place], [name] ([id number]) Entered By: [name] on 03/20/2016 10:45:41 [name] ([id number]) -------------------------------------------------------------------------------- Multi Wound Chart Details [name] Date of Service: 03/20/2016 10:45 AM Patient Name: C.  Patient Account Number: [id number] Medical Record Treating RN: [name] RN, BSN,  Sink [id number] Number: Other Clinician: Date of Birth/Sex: 09/09/1945 (70 y.o. Male) Treating [name], [name] Primary Care Physician: Physician/Extender: G Referring Physician: SELF, REFERRED Weeks in Treatment: 9 Vital Signs Height(in): 76 Pulse(bpm): 79 Weight(lbs): 238 Blood Pressure 153/66 (mmHg): Body Mass Index(BMI): 29 Temperature(F): 98.1 Respiratory Rate 17 (breaths/min): Photos: [1:No Photos] [2:No Photos] [N/A:N/A] Wound Location: [1:Right Malleolus - Lateral] [2:Right Malleolus - Medial N/A] Wounding Event: [1:Gradually Appeared] [2:Gradually Appeared] [N/A:N/A] Primary Etiology: [1:Venous Leg Ulcer] [2:Arterial Insufficiency Ulcer N/A] Comorbid History: [1:Cataracts, Chronic Obstructive Pulmonary Disease (COPD), Osteoarthritis] [2:Cataracts, Chronic Obstructive Pulmonary Disease (COPD), Osteoarthritis] [  N/A:N/A] Date Acquired: [1:08/11/2015] [2:01/30/2016] [N/A:N/A] Weeks of Treatment: [1:9] [2:7] [N/A:N/A] Wound Status: [1:Open] [2:Open] [N/A:N/A] Clustered Wound: [1:No] [2:Yes] [N/A:N/A] Measurements L x W x D 12x8x0.2 [2:6.8x4.3x0.2] [N/A:N/A] (cm) Area (cm) : [1:75.398] [2:22.965] [N/A:N/A] Volume (cm) : [1:15.08] [2:4.593] [N/A:N/A] % Reduction in Area: [1:-336.40%] [2:-98.90%] [N/A:N/A] % Reduction in Volume: -190.90% [2:-297.70%] [N/A:N/A] Classification: [1:Full Thickness Without Exposed Support Structures] [2:Full Thickness Without Exposed Support Structures] [N/A:N/A] Exudate Amount: [1:Large] [2:Large] [N/A:N/A] Exudate Type: [1:Serosanguineous] [2:Serosanguineous] [N/A:N/A] Exudate Color: [1:red, brown] [2:red, brown] [N/A:N/A] Foul Odor After [1:Yes] [2:No] [N/A:N/A] Cleansing: Odor Anticipated Due to No [2:N/A] [N/A:N/A] Product Use: MUNEER, LEIDER. ([id number]) Wound Margin: Distinct, outline attached Indistinct, nonvisible N/A Granulation Amount: Small  (1-33%) Small (1-33%) N/A Granulation Quality: Pink Pink N/A Necrotic Amount: Large (67-100%) Large (67-100%) N/A Exposed Structures: Fascia: No Fascia: No N/A Fat: No Fat: No Tendon: No Tendon: No Muscle: No Muscle: No Joint: No Joint: No Bone: No Bone: No Limited to Skin Limited to Skin Breakdown Breakdown Epithelialization: Small (1-33%) Small (1-33%) N/A Periwound Skin Texture: Scarring: Yes Edema: No N/A Edema: No Excoriation: No Excoriation: No Induration: No Induration: No Callus: No Callus: No Crepitus: No Crepitus: No Fluctuance: No Fluctuance: No Friable: No Friable: No Rash: No Rash: No Scarring: No Periwound Skin Maceration: Yes Maceration: Yes N/A Moisture: Moist: Yes Moist: Yes Dry/Scaly: No Dry/Scaly: No Periwound Skin Color: Hemosiderin Staining: Yes Hemosiderin Staining: Yes N/A Atrophie Blanche: No Atrophie Blanche: No Cyanosis: No Cyanosis: No Ecchymosis: No Ecchymosis: No Erythema: No Erythema: No Mottled: No Mottled: No Pallor: No Pallor: No Rubor: No Rubor: No Temperature: No Abnormality No Abnormality N/A Tenderness on Yes Yes N/A Palpation: Wound Preparation: Ulcer Cleansing: Ulcer Cleansing: N/A Rinsed/Irrigated with Rinsed/Irrigated with Saline, Other: SUrg Scrub Saline, Other: SUrg Scrub and water with water Topical Anesthetic Topical Anesthetic Applied: Other: Lidocaine Applied: Other: lidocaine 4% 4% Treatment Notes Electronic Signature(s) Signed: 03/20/2016 4:09:58 PM By: [name] BSN, RN [place], [name] ([id number]) Entered By: [name] on 03/20/2016 11:20:46 [name] ([id number]) -------------------------------------------------------------------------------- Multi-Disciplinary Care Plan Details [name] Date of Service: 03/20/2016 10:45 AM Patient Name: C. Patient Account Number: [id number] Medical Record Treating RN: [name] RN, BSN, [place] Sink [id number] Number: Other  Clinician: Date of Birth/Sex: 26-Jul-1946 (70 y.o. Male) Treating [name], [name] Primary Care Physician: Physician/Extender: G Referring Physician: SELF, REFERRED Weeks in Treatment: 9 Active Inactive Orientation to the Wound Care Program Nursing Diagnoses: Knowledge deficit related to the wound healing center program Goals: Patient/caregiver will verbalize understanding of the Wound Healing Center Program Date Initiated: 01/17/2016 Goal Status: Active Interventions: Provide education on orientation to the wound center Notes: Wound/Skin Impairment Nursing Diagnoses: Impaired tissue integrity Goals: Ulcer/skin breakdown will heal within 14 weeks Date Initiated: 01/17/2016 Goal Status: Active Interventions: Assess ulceration(s) every visit Notes: Electronic Signature(s) Signed: 03/20/2016 4:09:58 PM By: [name] BSN, RN Entered By: [name] on 03/20/2016 11:20:37 [name] ([id number]) -------------------------------------------------------------------------------- Pain Assessment Details [name] Date of Service: 03/20/2016 10:45 AM Patient Name: C. Patient Account Number: [id number] Medical Record Treating RN: [name] RN, BSN, [place] Sink [id number] Number: Other Clinician: Date of Birth/Sex: [birth year] (70 y.o. Male) Treating [name], [name] Primary Care Physician: Physician/Extender: G Referring Physician: SELF, REFERRED Weeks in Treatment: 9 Active Problems Location of Pain Severity and Description of Pain Patient Has Paino No Site Locations With Dressing Change: No Pain Management and Medication Current Pain Management: Electronic Signature(s) Signed: 03/20/2016 4:09:58 PM By: [name] BSN, RN Entered By: [name] on 03/20/2016 10:45:12 [place],  BARTLEY VUOLO ([id number]) -------------------------------------------------------------------------------- Patient/Caregiver Education Details [name] Date of Service: 03/20/2016 10:45  AM Patient Name: C. Patient Account Number: [id number] Medical Record Treating RN: [name] RN, BSN, [place] Sink [id number] Number: Other Clinician: Date of Birth/Gender: 1946/02/02 (70 y.o. Male) Treating [name], [name] Primary Care Physician: Physician/Extender: G Referring Physician: SELF, REFERRED Weeks in Treatment: 9 Education Assessment Education Provided To: Patient Education Topics Provided Welcome To The Wound Care Center: Methods: Explain/Verbal Responses: State content correctly Wound Debridement: Methods: Explain/Verbal Responses: State content correctly Wound/Skin Impairment: Methods: Explain/Verbal Responses: State content correctly Electronic Signature(s) Signed: 03/20/2016 4:09:58 PM By: [name] BSN, RN Entered By: [name] on 03/20/2016 11:22:23 [name] ([id number]) -------------------------------------------------------------------------------- Wound Assessment Details [name] Date of Service: 03/20/2016 10:45 AM Patient Name: C. Patient Account Number: [id number] Medical Record Treating RN: [name], RN, BSN, [place] Sink [id number] Number: Other Clinician: Date of Birth/Sex: [birth year] (70 y.o. Male) Treating [name], [name] Primary Care Physician: Physician/Extender: G Referring Physician: SELF, REFERRED Weeks in Treatment: 9 Wound Status Wound Number: 1 Primary Venous Leg Ulcer Etiology: Wound Location: Right Malleolus - Lateral Wound Open Wounding Event: Gradually Appeared Status: Date Acquired: 08/11/2015 Comorbid Cataracts, Chronic Obstructive Weeks Of Treatment: 9 History: Pulmonary Disease (COPD), Clustered Wound: No Osteoarthritis Photos Photo Uploaded By: [name] on 03/20/2016 12:51:59 Wound Measurements Length: (cm) 12 Width: (cm) 8 Depth: (cm) 0.2 Area: (cm) 75.398 Volume: (cm) 15.08 % Reduction in Area: -336.4% % Reduction in Volume: -190.9% Epithelialization: Small (1-33%) Tunneling: No Undermining:  No Wound Description Full Thickness Without Exposed Classification: Support Structures DORIEN, MAYOTTE ([id number]) Foul Odor After Cleansing: Yes Due to Product Use: No Wound Margin: Distinct, outline attached Exudate Large Amount: Exudate Type: Serosanguineous Exudate Color: red, brown Wound Bed Granulation Amount: Small (1-33%) Exposed Structure Granulation Quality: Pink Fascia Exposed: No Necrotic Amount: Large (67-100%) Fat Layer Exposed: No Necrotic Quality: Adherent Slough Tendon Exposed: No Muscle Exposed: No Joint Exposed: No Bone Exposed: No Limited to Skin Breakdown Periwound Skin Texture Texture Color No Abnormalities Noted: No No Abnormalities Noted: No Callus: No Atrophie Blanche: No Crepitus: No Cyanosis: No Excoriation: No Ecchymosis: No Fluctuance: No Erythema: No Friable: No Hemosiderin Staining: Yes Induration: No Mottled: No Localized Edema: No Pallor: No Rash: No Rubor: No Scarring: Yes Temperature / Pain Moisture Temperature: No Abnormality No Abnormalities Noted: No Tenderness on Palpation: Yes Dry / Scaly: No Maceration: Yes Moist: Yes Wound Preparation Ulcer Cleansing: Rinsed/Irrigated with Saline, Other: SUrg Scrub and water, Topical Anesthetic Applied: Other: Lidocaine 4%, Treatment Notes Wound #1 (Right, Lateral Malleolus) 1. Cleansed with: Cleanse wound with antibacterial soap and water 4. Dressing Applied: Iodoflex 5. Secondary Dressing Applied ABD Pad TANOR, GLASPY. ([id number]) Guaze, ABD and kerlix/Conform 7. Secured with Secretary/administrator) Signed: 03/20/2016 4:09:58 PM By: [name] BSN, RN Entered By: [name] on 03/20/2016 11:00:17 [name] ([id number]) -------------------------------------------------------------------------------- Wound Assessment Details [name] Date of Service: 03/20/2016 10:45 AM Patient Name: C. Patient Account Number: [id number] Medical  Record Treating RN: [name] RN, BSN,  Sink [id number] Number: Other Clinician: Date of Birth/Sex: [birth year] (70 y.o. Male) Treating [name], [name] Primary Care Physician: Physician/Extender: G Referring Physician: SELF, REFERRED Weeks in Treatment: 9 Wound Status Wound Number: 2 Primary Arterial Insufficiency Ulcer Etiology: Wound Location: Right Malleolus - Medial Wound Open Wounding Event: Gradually Appeared Status: Date Acquired: 01/30/2016 Comorbid Cataracts, Chronic Obstructive Weeks Of Treatment: 7 History: Pulmonary Disease (COPD), Clustered Wound: Yes Osteoarthritis Photos Photo Uploaded By: [name] on 03/20/2016 12:51:59 Wound Measurements Length: (cm) 6.8  Width: (cm) 4.3 Depth: (cm) 0.2 Area: (cm) 22.965 Volume: (cm) 4.593 % Reduction in Area: -98.9% % Reduction in Volume: -297.7% Epithelialization: Small (1-33%) Tunneling: No Undermining: No Wound Description Full Thickness Without Exposed Foul Odor Aft Classification: Support Structures Wound Margin: Indistinct, nonvisible Exudate Large Amount: Exudate Type: Serosanguineous Exudate Color: red, brown er Cleansing: No Wound Bed Fruchter, Codi C. ([id number]) Granulation Amount: Small (1-33%) Exposed Structure Granulation Quality: Pink Fascia Exposed: No Necrotic Amount: Large (67-100%) Fat Layer Exposed: No Necrotic Quality: Adherent Slough Tendon Exposed: No Muscle Exposed: No Joint Exposed: No Bone Exposed: No Limited to Skin Breakdown Periwound Skin Texture Texture Color No Abnormalities Noted: No No Abnormalities Noted: No Callus: No Atrophie Blanche: No Crepitus: No Cyanosis: No Excoriation: No Ecchymosis: No Fluctuance: No Erythema: No Friable: No Hemosiderin Staining: Yes Induration: No Mottled: No Localized Edema: No Pallor: No Rash: No Rubor: No Scarring: No Temperature / Pain Moisture Temperature: No Abnormality No Abnormalities Noted: No Tenderness on  Palpation: Yes Dry / Scaly: No Maceration: Yes Moist: Yes Wound Preparation Ulcer Cleansing: Rinsed/Irrigated with Saline, Other: SUrg Scrub with water, Topical Anesthetic Applied: Other: lidocaine 4%, Treatment Notes Wound #2 (Right, Medial Malleolus) 1. Cleansed with: Cleanse wound with antibacterial soap and water 4. Dressing Applied: Iodoflex 5. Secondary Dressing Applied ABD Pad Guaze, ABD and kerlix/Conform 7. Secured with Secretary/administrator) Signed: 03/20/2016 4:09:58 PM By: [name] BSN, RN Entered By: [name] on 03/20/2016 11:00:28 JAVEON, MACMURRAY ([id number]) IVORY, BAIL ([id number]) -------------------------------------------------------------------------------- Vitals Details [name] Date of Service: 03/20/2016 10:45 AM Patient Name: C. Patient Account Number: [id number] Medical Record Treating RN: [name], RN, BSN, [place] Sink [id number] Number: Other Clinician: Date of Birth/Sex: 1946-06-11 (70 y.o. Male) Treating [name], [name] Primary Care Physician: Physician/Extender: G Referring Physician: SELF, REFERRED Weeks in Treatment: 9 Vital Signs Time Taken: 10:48 Temperature (F): 98.1 Height (in): 76 Pulse (bpm): 79 Weight (lbs): 238 Respiratory Rate (breaths/min): 17 Body Mass Index (BMI): 29 Blood Pressure (mmHg): 153/66 Reference Range: 80 - 120 mg / dl Electronic Signature(s) Signed: 03/20/2016 4:09:58 PM By: [name] BSN, RN Entered By: [name] on 03/20/2016 10:48:40
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
## 2016-03-23 DIAGNOSIS — I87331 Chronic venous hypertension (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: Secondary | ICD-10-CM | POA: Diagnosis not present

## 2016-03-24 NOTE — Progress Notes (Signed)
ZAIDAN, KEEBLE ([id number]) Visit Report for 03/23/2016 Arrival Information Details Patient Name: [name], [name]. Date of Service: 03/23/2016 8:00 AM Medical Record Number: [id number] Patient Account Number: [id number] Date of Birth/Sex: 1946/07/12 (70 y.o. Male) Treating RN: Huel Coventry Primary Care Physician: Other Clinician: Referring Physician: SELF, REFERRED Treating Physician/Extender: Rudene Re in Treatment: 9 Visit Information History Since Last Visit Added or deleted any medications: No Patient Arrived: Ambulatory Any new allergies or adverse reactions: No Arrival Time: 08:19 Had a fall or experienced change in No Accompanied By: wife activities of daily living that may affect Transfer Assistance: None risk of falls: Patient Identification Verified: Yes Signs or symptoms of abuse/neglect since last No Secondary Verification Process Yes visito Completed: Hospitalized since last visit: No Patient Requires Transmission- No Has Dressing in Place as Prescribed: Yes Based Precautions: Pain Present Now: No Patient Has Alerts: Yes Patient Alerts: Patient on Blood Thinner Electronic Signature(s) Signed: 03/23/2016 4:38:27 PM By: Elliot Gurney, RN, BSN, Kim RN, BSN Entered By: Elliot Gurney, RN, BSN, Kim on 03/23/2016 08:19:58 [name] ([id number]) -------------------------------------------------------------------------------- Clinic Level of Care Assessment Details Patient Name: [name] Date of Service: 03/23/2016 8:00 AM Medical Record Number: [id number] Patient Account Number: [id number] Date of Birth/Sex: [birth year] (70 y.o. Male) Treating RN: Huel Coventry Primary Care Physician: Other Clinician: Referring Physician: SELF, REFERRED Treating Physician/Extender: Rudene Re in Treatment: 9 Clinic Level of Care Assessment Items TOOL 3 Quantity Score  - Use when EandM and Procedure is performed on FOLLOW-UP visit 0 ASSESSMENTS -  Nursing Assessment / Reassessment  - Reassessment of Co-morbidities (includes updates in patient status) 0  - Reassessment of Adherence to Treatment Plan 0 ASSESSMENTS - Wound and Skin Assessment / Reassessment  - Points for Wound Assessment can only be taken for a new wound of unknown 0 or different etiology and a procedure is NOT performed to that wound X - Simple Wound Assessment / Reassessment - one wound 1 5  - Complex Wound Assessment / Reassessment - multiple wounds 0  - Dermatologic / Skin Assessment (not related to wound area) 0 ASSESSMENTS - Focused Assessment  - Circumferential Edema Measurements - multi extremities 0  - Nutritional Assessment / Counseling / Intervention 0  - Lower Extremity Assessment (monofilament, tuning fork, pulses) 0  - Peripheral Arterial Disease Assessment (using hand held doppler) 0 ASSESSMENTS - Ostomy and/or Continence Assessment and Care  - Incontinence Assessment and Management 0  - Ostomy Care Assessment and Management (repouching, etc.) 0 PROCESS - Coordination of Care  - Points for Discharge Coordination can only be taken for a new wound of 0 unknown or different etiology and a procedure is NOT performed to that wound X - Simple Patient / Family Education for ongoing care 1 15  - Complex (extensive) Patient / Family Education for ongoing care 0 [name], [name] ([id number])  - Staff obtains Consents, Records, Test Results / Process Orders 0  - Staff telephones HHA, Nursing Homes / Clarify orders / etc 0  - Routine Transfer to another Facility (non-emergent condition) 0  - Routine Hospital Admission (non-emergent condition) 0  - New Admissions / Manufacturing engineer / Ordering NPWT, Apligraf, etc. 0  - Emergency Hospital Admission (emergent condition) 0 X - Simple Discharge Coordination 1 10  - Complex (extensive) Discharge Coordination 0 PROCESS - Special Needs  - Pediatric / Minor Patient  Management 0  - Isolation Patient Management 0  - Hearing / Language / Visual special needs 0  - Assessment of  Community assistance (transportation, D/C planning, etc.) 0 []  - Additional assistance / Altered mentation 0 []  - Support Surface(s) Assessment (bed, cushion, seat, etc.) 0 INTERVENTIONS - Wound Cleansing / Measurement []  - Points for Wound Cleaning / Measurement, Wound Dressing, Specimen 0 Collection and Specimen taken to lab can only be taken for a new wound of unknown or different etiology and a procedure is NOT performed to that wound X - Simple Wound Cleansing - one wound 1 5 []  - Complex Wound Cleansing - multiple wounds 0 X - Wound Imaging (photographs - any number of wounds) 1 5 []  - Wound Tracing (instead of photographs) 0 X - Simple Wound Measurement - one wound 1 5 []  - Complex Wound Measurement - multiple wounds 0 INTERVENTIONS - Wound Dressings []  - Small Wound Dressing one or multiple wounds 0 [name], [name] []  - Medium Wound Dressing one or multiple wounds 0 []  - Large Wound Dressing one or multiple wounds 0 INTERVENTIONS - Miscellaneous []  - External ear exam 0 []  - Specimen Collection (cultures, biopsies, blood, body fluids, etc.) 0 []  - Specimen(s) / Culture(s) sent or taken to Lab for analysis 0 []  - Patient Transfer (multiple staff / Nurse, adult / Similar devices) 0 []  - Simple Staple / Suture removal (25 or less) 0 []  - Complex Staple / Suture removal (26 or more) 0 []  - Hypo / Hyperglycemic Management (close monitor of Blood Glucose) 0 []  - Ankle / Brachial Index (ABI) - do not check if billed separately 0 []  - Vital Signs 0 Has the patient been seen at the hospital within the last three years: Yes Total Score: 45 Level Of Care: New/Established - Level 2 Electronic Signature(s) Signed: 03/23/2016 4:38:27 PM By: Elliot Gurney, RN, BSN, Kim RN, BSN Entered By: Elliot Gurney, RN, BSN, Kim on 03/23/2016 08:45:24 [name]  ([id number]) -------------------------------------------------------------------------------- Encounter Discharge Information Details Patient Name: [name] Date of Service: 03/23/2016 8:00 AM Medical Record Number: [id number] Patient Account Number: [id number] Date of Birth/Sex: [birth year] (70 y.o. Male) Treating RN: Huel Coventry Primary Care Physician: Other Clinician: Referring Physician: SELF, REFERRED Treating Physician/Extender: Rudene Re in Treatment: 9 Encounter Discharge Information Items Discharge Pain Level: 0 Discharge Condition: Stable Ambulatory Status: Ambulatory Discharge Destination: Home Private Transportation: Auto Accompanied By: wife Schedule Follow-up Appointment: Yes Medication Reconciliation completed and Yes provided to Patient/Care Tyleigh Mahn: Clinical Summary of Care: Electronic Signature(s) Signed: 03/23/2016 4:38:27 PM By: Elliot Gurney, RN, BSN, Kim RN, BSN Entered By: Elliot Gurney, RN, BSN, Kim on 03/23/2016 08:44:25 [name] ([id number]) -------------------------------------------------------------------------------- Patient/Caregiver Education Details Patient Name: [name] Date of Service: 03/23/2016 8:00 AM Medical Record Number: [id number] Patient Account Number: [id number] Date of Birth/Gender: 04/03/1946 (70 y.o. Male) Treating RN: Huel Coventry Primary Care Physician: Other Clinician: Referring Physician: SELF, REFERRED Treating Physician/Extender: Rudene Re in Treatment: 9 Education Assessment Education Provided To: Patient Education Topics Provided Wound/Skin Impairment: Handouts: Caring for Your Ulcer Methods: Demonstration Responses: State content correctly Electronic Signature(s) Signed: 03/23/2016 4:38:27 PM By: Elliot Gurney, RN, BSN, Kim RN, BSN Entered By: Elliot Gurney, RN, BSN, Kim on 03/23/2016 08:44:06 [name]  ([id number]) -------------------------------------------------------------------------------- Wound Assessment Details Patient Name: [name] Date of Service: 03/23/2016 8:00 AM Medical Record Number: [id number] Patient Account Number: [id number] Date of Birth/Sex: 09/23/1945 (70 y.o. Male) Treating RN: Huel Coventry Primary Care Physician: Other Clinician: Referring Physician: SELF, REFERRED Treating Physician/Extender: Rudene Re in Treatment: 9 Wound Status Wound Number: 1 Primary Venous Leg Ulcer Etiology: Wound Location: Right  Malleolus - Lateral Wound Open Wounding Event: Gradually Appeared Status: Date Acquired: 08/11/2015 Comorbid Cataracts, Chronic Obstructive Weeks Of Treatment: 9 History: Pulmonary Disease (COPD), Clustered Wound: No Osteoarthritis Photos Wound Measurements Length: (cm) 7 Width: (cm) 6.2 Depth: (cm) 0.2 Area: (cm) 34.086 Volume: (cm) 6.817 % Reduction in Area: -97.3% % Reduction in Volume: -31.5% Epithelialization: Small (1-33%) Wound Description Full Thickness Without Exposed Classification: Support Structures Wound Margin: Distinct, outline attached Exudate Large Amount: Exudate Type: Serosanguineous Exudate Color: red, brown Foul Odor After Cleansing: Yes Due to Product Use: No Wound Bed Granulation Amount: Small (1-33%) Exposed Structure Granulation Quality: Pink Fascia Exposed: No Necrotic Amount: Large (67-100%) Fat Layer Exposed: No [name], [name] Necrotic Quality: Adherent Slough Tendon Exposed: No Muscle Exposed: No Joint Exposed: No Bone Exposed: No Limited to Skin Breakdown Periwound Skin Texture Texture Color No Abnormalities Noted: No No Abnormalities Noted: No Callus: No Atrophie Blanche: No Crepitus: No Cyanosis: No Excoriation: No Ecchymosis: No Fluctuance: No Erythema: No Friable: No Hemosiderin Staining: Yes Induration: No Mottled: No Localized Edema:  No Pallor: No Rash: No Rubor: No Scarring: Yes Temperature / Pain Moisture Temperature: No Abnormality No Abnormalities Noted: No Tenderness on Palpation: Yes Dry / Scaly: No Maceration: Yes Moist: Yes Wound Preparation Ulcer Cleansing: Rinsed/Irrigated with Saline, Other: SUrg Scrub and water, Treatment Notes Wound #1 (Right, Lateral Malleolus) 1. Cleansed with: Clean wound with Normal Saline 2. Anesthetic Topical Lidocaine 4% cream to wound bed prior to debridement 3. Peri-wound Care: Barrier cream 4. Dressing Applied: Iodoflex 5. Secondary Dressing Applied ABD Pad 6. Footwear/Offloading device applied Surgical shoe Notes xsorb, abd, kerlix and Theatre manager) Signed: 03/23/2016 4:38:27 PM By: Elliot Gurney, RN, BSN, Kim RN, BSN [address] [name] ([id number]) Entered By: Elliot Gurney, RN, BSN, Kim on 03/23/2016 08:65:78 [name] ([id number]) -------------------------------------------------------------------------------- Wound Assessment Details Patient Name: [name] Date of Service: 03/23/2016 8:00 AM Medical Record Number: [id number] Patient Account Number: [id number] Date of Birth/Sex: 11/21/1945 (70 y.o. Male) Treating RN: Huel Coventry Primary Care Physician: Other Clinician: Referring Physician: SELF, REFERRED Treating Physician/Extender: Rudene Re in Treatment: 9 Wound Status Wound Number: 2 Primary Arterial Insufficiency Ulcer Etiology: Wound Location: Right Malleolus - Medial Wound Open Wounding Event: Gradually Appeared Status: Date Acquired: 01/30/2016 Comorbid Cataracts, Chronic Obstructive Weeks Of Treatment: 7 History: Pulmonary Disease (COPD), Clustered Wound: Yes Osteoarthritis Photos Wound Measurements Length: (cm) 6.8 Width: (cm) 3 Depth: (cm) 0.2 Area: (cm) 16.022 Volume: (cm) 3.204 % Reduction in Area: -38.8% % Reduction in Volume: -177.4% Epithelialization: Small (1-33%) Wound Description Full  Thickness Without Exposed Foul Odor Aft Classification: Support Structures Wound Margin: Indistinct, nonvisible Exudate Large Amount: Exudate Type: Serosanguineous Exudate Color: red, brown er Cleansing: No Wound Bed Granulation Amount: Small (1-33%) Exposed Structure Granulation Quality: Pink Fascia Exposed: No Necrotic Amount: Large (67-100%) Fat Layer Exposed: No [name], [name] ([id number]) Necrotic Quality: Adherent Slough Tendon Exposed: No Muscle Exposed: No Joint Exposed: No Bone Exposed: No Limited to Skin Breakdown Periwound Skin Texture Texture Color No Abnormalities Noted: No No Abnormalities Noted: No Callus: No Atrophie Blanche: No Crepitus: No Cyanosis: No Excoriation: No Ecchymosis: No Fluctuance: No Erythema: No Friable: No Hemosiderin Staining: Yes Induration: No Mottled: No Localized Edema: No Pallor: No Rash: No Rubor: No Scarring: No Temperature / Pain Moisture Temperature: No Abnormality No Abnormalities Noted: No Tenderness on Palpation: Yes Dry / Scaly: No Maceration: Yes Moist: Yes Wound Preparation Ulcer Cleansing: Rinsed/Irrigated with Saline, Other: SUrg Scrub with water, Topical Anesthetic Applied: None Treatment  Notes Wound #2 (Right, Medial Malleolus) 1. Cleansed with: Clean wound with Normal Saline 2. Anesthetic Topical Lidocaine 4% cream to wound bed prior to debridement 3. Peri-wound Care: Barrier cream 4. Dressing Applied: Iodoflex 5. Secondary Dressing Applied ABD Pad 6. Footwear/Offloading device applied Surgical shoe Notes xsorb, abd, kerlix and [employer]) [name], [name] ([id number]) Signed: 03/23/2016 4:38:27 PM By: Elliot Gurney, RN, BSN, Kim RN, BSN Entered By: Elliot Gurney, RN, BSN, Kim on 03/23/2016 08:30:21

## 2016-03-27 ENCOUNTER — Encounter: Payer: Medicare HMO | Attending: Internal Medicine | Admitting: Internal Medicine

## 2016-03-27 DIAGNOSIS — Y832 Surgical operation with anastomosis, bypass or graft as the cause of abnormal reaction of the patient, or of later complication, without mention of misadventure at the time of the procedure: Secondary | ICD-10-CM | POA: Diagnosis not present

## 2016-03-27 DIAGNOSIS — I87331 Chronic venous hypertension (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: Secondary | ICD-10-CM | POA: Diagnosis not present

## 2016-03-27 DIAGNOSIS — Z86718 Personal history of other venous thrombosis and embolism: Secondary | ICD-10-CM | POA: Diagnosis not present

## 2016-03-27 DIAGNOSIS — J449 Chronic obstructive pulmonary disease, unspecified: Secondary | ICD-10-CM | POA: Insufficient documentation

## 2016-03-27 DIAGNOSIS — L97313 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: Secondary | ICD-10-CM | POA: Diagnosis not present

## 2016-03-27 DIAGNOSIS — M1990 Unspecified osteoarthritis, unspecified site: Secondary | ICD-10-CM | POA: Insufficient documentation

## 2016-03-27 DIAGNOSIS — Z7901 Long term (current) use of anticoagulants: Secondary | ICD-10-CM | POA: Diagnosis not present

## 2016-03-27 DIAGNOSIS — T85613A 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, initial encounter: Secondary | ICD-10-CM | POA: Insufficient documentation
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
## 2016-03-28 NOTE — Progress Notes (Signed)
[name], [name] ([id number]) Visit Report for 03/27/2016 Arrival Information Details [name], [name] Date of Service: 03/27/2016 10:45 AM Patient Name: C. Patient Account Number: [id number] Medical Record Treating RN: [name] RN, BSN, [place] Sink [id number] Number: Other Clinician: Date of Birth/Sex: 1946/05/19 (70 y.o. Male) Treating [name], [name] Primary Care Physician: Physician/Extender: G Referring Physician: SELF, REFERRED Weeks in Treatment: 10 Visit Information History Since Last Visit All ordered tests and consults were completed: No Patient Arrived: Ambulatory Added or deleted any medications: No Arrival Time: 11:00 Any new allergies or adverse reactions: No Accompanied By: wife Had a fall or experienced change in No Transfer Assistance: None activities of daily living that may affect Patient Identification Verified: Yes risk of falls: Secondary Verification Process Yes Signs or symptoms of abuse/neglect since last No Completed: visito Patient Requires Transmission- No Hospitalized since last visit: No Based Precautions: Has Dressing in Place as Prescribed: Yes Patient Has Alerts: Yes Pain Present Now: No Patient Alerts: Patient on Blood Thinner Electronic Signature(s) Signed: 03/27/2016 4:03:28 PM By: [name] BSN, RN Entered By: [name] on 03/27/2016 11:01:04 [name] ([id number]) -------------------------------------------------------------------------------- Encounter Discharge Information Details [name] Date of Service: 03/27/2016 10:45 AM Patient Name: C. Patient Account Number: [id number] Medical Record Treating RN: [name] RN, BSN, [place] Sink [id number] Number: Other Clinician: Date of Birth/Sex: 12-11-1945 (70 y.o. Male) Treating [name], [name] Primary Care Physician: Physician/Extender: G Referring Physician: SELF, REFERRED Weeks in Treatment: 10 Encounter Discharge Information Items Discharge Pain Level: 0 Discharge Condition:  Stable Ambulatory Status: Ambulatory Discharge Destination: Home Transportation: Private Auto Accompanied By: wife Schedule Follow-up Appointment: No Medication Reconciliation completed No and provided to Patient/Care [place]: Patient Clinical Summary of Care: Declined Electronic Signature(s) Signed: 03/27/2016 11:49:39 AM By: Gwenlyn Perking Entered By: Gwenlyn Perking on 03/27/2016 11:49:38 [name] ([id number]) -------------------------------------------------------------------------------- Lower Extremity Assessment Details [name] Date of Service: 03/27/2016 10:45 AM Patient Name: C. Patient Account Number: [id number] Medical Record Treating RN: [name] RN, BSN, [place] Sink [id number] Number: Other Clinician: Date of Birth/Sex: 08/04/1946 (70 y.o. Male) Treating [name], [name] Primary Care Physician: Physician/Extender: G Referring Physician: SELF, REFERRED Weeks in Treatment: 10 Edema Assessment Assessed: [Left: No] [Right: No] Edema: [Left: N] [Right: o] Calf Left: Right: Point of Measurement: 40 cm From Medial Instep cm cm Ankle Left: Right: Point of Measurement: 12 cm From Medial Instep cm cm Vascular Assessment Claudication: Claudication Assessment [Right:None] Pulses: Posterior Tibial Extremity colors, hair growth, and conditions: Extremity Color: [Right:Mottled] Hair Growth on Extremity: [Right:Yes] Temperature of Extremity: [Right:Warm] Capillary Refill: [Right:< 3 seconds] Toe Nail Assessment Left: Right: Thick: Yes Discolored: Yes Deformed: No Improper Length and Hygiene: No Electronic Signature(s) Signed: 03/27/2016 4:03:28 PM By: [name] BSN, RN Entered By: [name] on 03/27/2016 11:02:25 [name] ([name], [name] ([id number]) -------------------------------------------------------------------------------- Multi Wound Chart Details [name] Date of Service: 03/27/2016 10:45 AM Patient Name: C.  Patient Account Number: [id number] Medical Record Treating RN: [name] RN, BSN,  Sink [id number] Number: Other Clinician: Date of Birth/Sex: 07-Dec-1945 (70 y.o. Male) Treating [name], [name] Primary Care Physician: Physician/Extender: G Referring Physician: SELF, REFERRED Weeks in Treatment: 10 Vital Signs Height(in): 76 Pulse(bpm): 81 Weight(lbs): 238 Blood Pressure 136/63 (mmHg): Body Mass Index(BMI): 29 Temperature(F): 98.7 Respiratory Rate 18 (breaths/min): Photos: [1:No Photos] [2:No Photos] [N/A:N/A] Wound Location: [1:Right Malleolus - Lateral] [2:Right Malleolus - Medial N/A] Wounding Event: [1:Gradually Appeared] [2:Gradually Appeared] [N/A:N/A] Primary Etiology: [1:Venous Leg Ulcer] [2:Arterial Insufficiency Ulcer N/A] Comorbid History: [1:Cataracts, Chronic Obstructive Pulmonary Disease (COPD), Osteoarthritis] [2:Cataracts, Chronic Obstructive Pulmonary Disease (  COPD), Osteoarthritis] [N/A:N/A] Date Acquired: [1:08/11/2015] [2:01/30/2016] [N/A:N/A] Weeks of Treatment: [1:10] [2:8] [N/A:N/A] Wound Status: [1:Open] [2:Open] [N/A:N/A] Clustered Wound: [1:No] [2:Yes] [N/A:N/A] Measurements L x W x D 11.3x6.3x0.2 [2:7x4.7x0.2] [N/A:N/A] (cm) Area (cm) : [1:55.912] [2:25.84] [N/A:N/A] Volume (cm) : [1:11.182] [2:5.168] [N/A:N/A] % Reduction in Area: [1:-223.60%] [2:-123.80%] [N/A:N/A] % Reduction in Volume: -115.70% [2:-347.40%] [N/A:N/A] Classification: [1:Full Thickness Without Exposed Support Structures] [2:Full Thickness Without Exposed Support Structures] [N/A:N/A] Exudate Amount: [1:Large] [2:Large] [N/A:N/A] Exudate Type: [1:Serosanguineous] [2:Serosanguineous] [N/A:N/A] Exudate Color: [1:red, brown] [2:red, brown] [N/A:N/A] Foul Odor After [1:Yes] [2:Yes] [N/A:N/A] Cleansing: Odor Anticipated Due to No [2:No] [N/A:N/A] Product Use: SABRIEL, SAMBERG. ([id number]) Wound Margin: Distinct, outline attached Indistinct, nonvisible N/A Granulation Amount: Small  (1-33%) Small (1-33%) N/A Granulation Quality: Pink Pink N/A Necrotic Amount: Large (67-100%) Medium (34-66%) N/A Exposed Structures: Fascia: No Fascia: No N/A Fat: No Fat: No Tendon: No Tendon: No Muscle: No Muscle: No Joint: No Joint: No Bone: No Bone: No Limited to Skin Limited to Skin Breakdown Breakdown Epithelialization: Small (1-33%) Small (1-33%) N/A Periwound Skin Texture: Scarring: Yes Edema: No N/A Edema: No Excoriation: No Excoriation: No Induration: No Induration: No Callus: No Callus: No Crepitus: No Crepitus: No Fluctuance: No Fluctuance: No Friable: No Friable: No Rash: No Rash: No Scarring: No Periwound Skin Maceration: Yes Maceration: Yes N/A Moisture: Moist: Yes Moist: Yes Dry/Scaly: No Dry/Scaly: No Periwound Skin Color: Hemosiderin Staining: Yes Hemosiderin Staining: Yes N/A Atrophie Blanche: No Atrophie Blanche: No Cyanosis: No Cyanosis: No Ecchymosis: No Ecchymosis: No Erythema: No Erythema: No Mottled: No Mottled: No Pallor: No Pallor: No Rubor: No Rubor: No Temperature: No Abnormality No Abnormality N/A Tenderness on Yes Yes N/A Palpation: Wound Preparation: Ulcer Cleansing: Ulcer Cleansing: N/A Rinsed/Irrigated with Rinsed/Irrigated with Saline, Other: SUrg Scrub Saline, Other: SUrg Scrub and water with water Topical Anesthetic Topical Anesthetic Applied: Other: lidocaine Applied: Other: lidocaine 4% 4% Treatment Notes Electronic Signature(s) Signed: 03/27/2016 4:03:28 PM By: [name] BSN, RN [name] ([id number]) Entered By: [name] on 03/27/2016 11:29:34 [name] ([id number]) -------------------------------------------------------------------------------- Multi-Disciplinary Care Plan Details [name] Date of Service: 03/27/2016 10:45 AM Patient Name: C. Patient Account Number: [id number] Medical Record Treating RN: [name] RN, BSN, [place] Sink [id number] Number: Other  Clinician: Date of Birth/Sex: 02-17-1946 (70 y.o. Male) Treating [name], [name] Primary Care Physician: Physician/Extender: G Referring Physician: SELF, REFERRED Weeks in Treatment: 10 Active Inactive Orientation to the Wound Care Program Nursing Diagnoses: Knowledge deficit related to the wound healing center program Goals: Patient/caregiver will verbalize understanding of the Wound Healing Center Program Date Initiated: 01/17/2016 Goal Status: Active Interventions: Provide education on orientation to the wound center Notes: Wound/Skin Impairment Nursing Diagnoses: Impaired tissue integrity Goals: Ulcer/skin breakdown will heal within 14 weeks Date Initiated: 01/17/2016 Goal Status: Active Interventions: Assess ulceration(s) every visit Notes: Electronic Signature(s) Signed: 03/27/2016 4:03:28 PM By: [name] BSN, RN Entered By: [name] on 03/27/2016 11:29:25 [name] ([id number]) -------------------------------------------------------------------------------- Pain Assessment Details [name] Date of Service: 03/27/2016 10:45 AM Patient Name: C. Patient Account Number: [id number] Medical Record Treating RN: [name] RN, BSN,  Sink [id number] Number: Other Clinician: Date of Birth/Sex: April 01, 1946 (70 y.o. Male) Treating [name], [name] Primary Care Physician: Physician/Extender: G Referring Physician: SELF, REFERRED Weeks in Treatment: 10 Active Problems Location of Pain Severity and Description of Pain Patient Has Paino No Site Locations With Dressing Change: No Pain Management and Medication Current Pain Management: Electronic Signature(s) Signed: 03/27/2016 4:03:28 PM By: [name] BSN, RN Entered By: [name] on 03/27/2016  11:01:16 [name], [name] ([id number]) -------------------------------------------------------------------------------- Patient/Caregiver Education Details [name] Date of Service: 03/27/2016 10:45  AM Patient Name: C. Patient Account Number: [id number] Medical Record Treating RN: [name] RN, BSN, [place] Sink [id number] Number: Other Clinician: Date of Birth/Gender: 14-Sep-1945 (70 y.o. Male) Treating [name], [name] Primary Care Physician: Physician/Extender: G Referring Physician: SELF, REFERRED Weeks in Treatment: 10 Education Assessment Education Provided To: Patient Education Topics Provided Welcome To The Wound Care Center: Methods: Explain/Verbal Responses: State content correctly Wound Debridement: Methods: Explain/Verbal Responses: State content correctly Wound/Skin Impairment: Methods: Explain/Verbal Responses: State content correctly Electronic Signature(s) Signed: 03/27/2016 4:03:28 PM By: [name] BSN, RN Entered By: [name] on 03/27/2016 11:51:17 [name] ([id number]) -------------------------------------------------------------------------------- Wound Assessment Details [name] Date of Service: 03/27/2016 10:45 AM Patient Name: C. Patient Account Number: [id number] Medical Record Treating RN: [name], RN, BSN, [place] Sink [id number] Number: Other Clinician: Date of Birth/Sex: [birth year] (70 y.o. Male) Treating [name], [name] Primary Care Physician: Physician/Extender: G Referring Physician: SELF, REFERRED Weeks in Treatment: 10 Wound Status Wound Number: 1 Primary Venous Leg Ulcer Etiology: Wound Location: Right Malleolus - Lateral Wound Open Wounding Event: Gradually Appeared Status: Date Acquired: 08/11/2015 Comorbid Cataracts, Chronic Obstructive Weeks Of Treatment: 10 History: Pulmonary Disease (COPD), Clustered Wound: No Osteoarthritis Photos Photo Uploaded By: [name] on 03/27/2016 16:05:34 Wound Measurements Length: (cm) 11.3 Width: (cm) 6.3 Depth: (cm) 0.2 Area: (cm) 55.912 Volume: (cm) 11.182 % Reduction in Area: -223.6% % Reduction in Volume: -115.7% Epithelialization: Small (1-33%) Tunneling: No Undermining:  No Wound Description Full Thickness Without Exposed Classification: Support Structures Wound Margin: Distinct, outline attached Exudate Large Amount: Exudate Type: Serosanguineous Exudate Color: red, brown Foul Odor After Cleansing: Yes Due to Product Use: No Wound Bed Simpson, Bertin C. ([id number]) Granulation Amount: Small (1-33%) Exposed Structure Granulation Quality: Pink Fascia Exposed: No Necrotic Amount: Large (67-100%) Fat Layer Exposed: No Necrotic Quality: Adherent Slough Tendon Exposed: No Muscle Exposed: No Joint Exposed: No Bone Exposed: No Limited to Skin Breakdown Periwound Skin Texture Texture Color No Abnormalities Noted: No No Abnormalities Noted: No Callus: No Atrophie Blanche: No Crepitus: No Cyanosis: No Excoriation: No Ecchymosis: No Fluctuance: No Erythema: No Friable: No Hemosiderin Staining: Yes Induration: No Mottled: No Localized Edema: No Pallor: No Rash: No Rubor: No Scarring: Yes Temperature / Pain Moisture Temperature: No Abnormality No Abnormalities Noted: No Tenderness on Palpation: Yes Dry / Scaly: No Maceration: Yes Moist: Yes Wound Preparation Ulcer Cleansing: Rinsed/Irrigated with Saline, Other: SUrg Scrub and water, Topical Anesthetic Applied: Other: lidocaine 4%, Treatment Notes Wound #1 (Right, Lateral Malleolus) 1. Cleansed with: Clean wound with Normal Saline 2. Anesthetic Topical Lidocaine 4% cream to wound bed prior to debridement 3. Peri-wound Care: Barrier cream 4. Dressing Applied: Iodoflex 5. Secondary Dressing Applied ABD Pad 6. Footwear/Offloading device applied Surgical shoe Notes TALLEN, SCHNORR ([id number]) xsorb, abd, kerlix and coban Electronic Signature(s) Signed: 03/27/2016 4:03:28 PM By: [name] BSN, RN Entered By: [name] on 03/27/2016 11:12:49 [name] ([id number]) -------------------------------------------------------------------------------- Wound  Assessment Details [name] Date of Service: 03/27/2016 10:45 AM Patient Name: C. Patient Account Number: [id number] Medical Record Treating RN: [name] RN, BSN, [place] Sink [id number] Number: Other Clinician: Date of Birth/Sex: 10/07/1945 (70 y.o. Male) Treating [name], [name] Primary Care Physician: Physician/Extender: G Referring Physician: SELF, REFERRED Weeks in Treatment: 10 Wound Status Wound Number: 2 Primary Arterial Insufficiency Ulcer Etiology: Wound Location: Right Malleolus - Medial Wound Open Wounding Event: Gradually Appeared Status: Date Acquired: 01/30/2016 Comorbid Cataracts, Chronic Obstructive Weeks Of Treatment: 8 History:  Pulmonary Disease (COPD), Clustered Wound: Yes Osteoarthritis Photos Photo Uploaded By: [name] on 03/27/2016 16:05:59 Wound Measurements Length: (cm) 7 Width: (cm) 4.7 Depth: (cm) 0.2 Area: (cm) 25.84 Volume: (cm) 5.168 % Reduction in Area: -123.8% % Reduction in Volume: -347.4% Epithelialization: Small (1-33%) Tunneling: No Undermining: No Wound Description Full Thickness Without Exposed Classification: Support Structures AARAV, BURGETT ([id number]) Foul Odor After Cleansing: Yes Due to Product Use: No Wound Margin: Indistinct, nonvisible Exudate Large Amount: Exudate Type: Serosanguineous Exudate Color: red, brown Wound Bed Granulation Amount: Small (1-33%) Exposed Structure Granulation Quality: Pink Fascia Exposed: No Necrotic Amount: Medium (34-66%) Fat Layer Exposed: No Necrotic Quality: Adherent Slough Tendon Exposed: No Muscle Exposed: No Joint Exposed: No Bone Exposed: No Limited to Skin Breakdown Periwound Skin Texture Texture Color No Abnormalities Noted: No No Abnormalities Noted: No Callus: No Atrophie Blanche: No Crepitus: No Cyanosis: No Excoriation: No Ecchymosis: No Fluctuance: No Erythema: No Friable: No Hemosiderin Staining: Yes Induration: No Mottled: No Localized Edema:  No Pallor: No Rash: No Rubor: No Scarring: No Temperature / Pain Moisture Temperature: No Abnormality No Abnormalities Noted: No Tenderness on Palpation: Yes Dry / Scaly: No Maceration: Yes Moist: Yes Wound Preparation Ulcer Cleansing: Rinsed/Irrigated with Saline, Other: SUrg Scrub with water, Topical Anesthetic Applied: Other: lidocaine 4%, Treatment Notes Wound #2 (Right, Medial Malleolus) 1. Cleansed with: Clean wound with Normal Saline 2. Anesthetic Topical Lidocaine 4% cream to wound bed prior to debridement 3. Peri-wound Care: Barrier cream METRO, EDENFIELD. ([id number]) 4. Dressing Applied: Iodoflex 5. Secondary Dressing Applied ABD Pad 6. Footwear/Offloading device applied Surgical shoe Notes xsorb, abd, kerlix and coban Electronic Signature(s) Signed: 03/27/2016 4:03:28 PM By: [name] BSN, RN Entered By: [name] on 03/27/2016 11:13:34 [name] ([id number]) -------------------------------------------------------------------------------- Vitals Details [name] Date of Service: 03/27/2016 10:45 AM Patient Name: C. Patient Account Number: [id number] Medical Record Treating RN: [name], RN, BSN, [place] Sink [id number] Number: Other Clinician: Date of Birth/Sex: 15-Oct-1945 (70 y.o. Male) Treating [name], [name] Primary Care Physician: Physician/Extender: G Referring Physician: SELF, REFERRED Weeks in Treatment: 10 Vital Signs Time Taken: 11:01 Temperature (F): 98.7 Height (in): 76 Pulse (bpm): 81 Weight (lbs): 238 Respiratory Rate (breaths/min): 18 Body Mass Index (BMI): 29 Blood Pressure (mmHg): 136/63 Reference Range: 80 - 120 mg / dl Electronic Signature(s) Signed: 03/27/2016 4:03:28 PM By: [name] BSN, RN Entered By: [name] on 03/27/2016 11:02:54

## 2016-03-30 DIAGNOSIS — I87331 Chronic venous hypertension (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: Secondary | ICD-10-CM | POA: Diagnosis not present

## 2016-03-31 NOTE — Progress Notes (Signed)
[name], [name] ([id number]) Visit Report for 03/30/2016 Arrival Information Details Patient Name: [name], [name]. Date of Service: 03/30/2016 8:00 AM Medical Record Number: [id number] Patient Account Number: [id number] Date of Birth/Sex: Dec 26, 1945 (70 y.o. Male) Treating RN: [name] Primary Care Physician: Other Clinician: Referring Physician: SELF, REFERRED Treating Physician/Extender: Rudene Re in Treatment: 10 Visit Information History Since Last Visit All ordered tests and consults were completed: No Patient Arrived: Ambulatory Added or deleted any medications: No Arrival Time: 08:29 Any new allergies or adverse reactions: No Accompanied By: self Had a fall or experienced change in No Transfer Assistance: None activities of daily living that may affect Patient Identification Verified: Yes risk of falls: Secondary Verification Process Yes Signs or symptoms of abuse/neglect since last No Completed: visito Patient Requires Transmission- No Hospitalized since last visit: No Based Precautions: Pain Present Now: No Patient Has Alerts: Yes Patient Alerts: Patient on Blood Thinner Electronic Signature(s) Signed: 03/30/2016 4:13:59 PM By: Alejandro Mulling Entered By: Alejandro Mulling on 03/30/2016 08:30:05 [name] ([id number]) -------------------------------------------------------------------------------- Clinic Level of Care Assessment Details Patient Name: [name] Date of Service: 03/30/2016 8:00 AM Medical Record Number: [id number] Patient Account Number: [id number] Date of Birth/Sex: 1946/07/04 (70 y.o. Male) Treating RN: [name] Primary Care Physician: Other Clinician: Referring Physician: SELF, REFERRED Treating Physician/Extender: Rudene Re in Treatment: 10 Clinic Level of Care Assessment Items TOOL 4 Quantity Score X - Use when only an EandM is performed on FOLLOW-UP visit 1 0 ASSESSMENTS - Nursing  Assessment / Reassessment X - Reassessment of Co-morbidities (includes updates in patient status) 1 10 X - Reassessment of Adherence to Treatment Plan 1 5 ASSESSMENTS - Wound and Skin Assessment / Reassessment []  - Simple Wound Assessment / Reassessment - one wound 0 X - Complex Wound Assessment / Reassessment - multiple wounds 2 5 []  - Dermatologic / Skin Assessment (not related to wound area) 0 ASSESSMENTS - Focused Assessment []  - Circumferential Edema Measurements - multi extremities 0 []  - Nutritional Assessment / Counseling / Intervention 0 []  - Lower Extremity Assessment (monofilament, tuning fork, pulses) 0 []  - Peripheral Arterial Disease Assessment (using hand held doppler) 0 ASSESSMENTS - Ostomy and/or Continence Assessment and Care []  - Incontinence Assessment and Management 0 []  - Ostomy Care Assessment and Management (repouching, etc.) 0 PROCESS - Coordination of Care X - Simple Patient / Family Education for ongoing care 1 15 []  - Complex (extensive) Patient / Family Education for ongoing care 0 []  - Staff obtains Chiropractor, Records, Test Results / Process Orders 0 []  - Staff telephones HHA, Nursing Homes / Clarify orders / etc 0 []  - Routine Transfer to another Facility (non-emergent condition) 0 Carriero, Vanden C. ([id number]) []  - Routine Hospital Admission (non-emergent condition) 0 []  - New Admissions / Manufacturing engineer / Ordering NPWT, Apligraf, etc. 0 []  - Emergency Hospital Admission (emergent condition) 0 X - Simple Discharge Coordination 1 10 []  - Complex (extensive) Discharge Coordination 0 PROCESS - Special Needs []  - Pediatric / Minor Patient Management 0 []  - Isolation Patient Management 0 []  - Hearing / Language / Visual special needs 0 []  - Assessment of Community assistance (transportation, D/C planning, etc.) 0 []  - Additional assistance / Altered mentation 0 []  - Support Surface(s) Assessment (bed, cushion, seat, etc.) 0 INTERVENTIONS -  Wound Cleansing / Measurement []  - Simple Wound Cleansing - one wound 0 X - Complex Wound Cleansing - multiple wounds 2 5 []  - Wound Imaging (photographs - any number of wounds)  0  - Wound Tracing (instead of photographs) 0  - Simple Wound Measurement - one wound 0 X - Complex Wound Measurement - multiple wounds 2 5 INTERVENTIONS - Wound Dressings  - Small Wound Dressing one or multiple wounds 0  - Medium Wound Dressing one or multiple wounds 0 X - Large Wound Dressing one or multiple wounds 1 20  - Application of Medications - topical 0  - Application of Medications - injection 0 INTERVENTIONS - Miscellaneous  - External ear exam 0 [name], [name]  - Specimen Collection (cultures, biopsies, blood, body fluids, etc.) 0  - Specimen(s) / Culture(s) sent or taken to Lab for analysis 0  - Patient Transfer (multiple staff / Michiel Sites Lift / Similar devices) 0  - Simple Staple / Suture removal (25 or less) 0  - Complex Staple / Suture removal (26 or more) 0  - Hypo / Hyperglycemic Management (close monitor of Blood Glucose) 0  - Ankle / Brachial Index (ABI) - do not check if billed separately 0 X - Vital Signs 1 5 Has the patient been seen at the hospital within the last three years: Yes Total Score: 95 Level Of Care: New/Established - Level 3 Electronic Signature(s) Signed: 03/30/2016 4:13:59 PM By: Alejandro Mulling Entered By: Alejandro Mulling on 03/30/2016 08:32:51 [name] ([id number]) -------------------------------------------------------------------------------- Encounter Discharge Information Details Patient Name: [name] Date of Service: 03/30/2016 8:00 AM Medical Record Number: [id number] Patient Account Number: [id number] Date of Birth/Sex: [birth year] (70 y.o. Male) Treating RN: [name] Primary Care Physician: Other Clinician: Referring Physician: SELF, REFERRED Treating Physician/Extender: Rudene Re in Treatment: 10 Encounter Discharge Information Items Discharge Pain Level: 0 Discharge Condition: Stable Ambulatory Status: Ambulatory Discharge Destination: Home Private Transportation: Auto Accompanied By: self Schedule Follow-up Appointment: Yes Medication Reconciliation completed and Yes provided to Patient/Care Anddy Wingert: Clinical Summary of Care: Electronic Signature(s) Signed: 03/30/2016 4:13:59 PM By: Alejandro Mulling Entered By: Alejandro Mulling on 03/30/2016 08:32:18 [name] ([id number]) -------------------------------------------------------------------------------- Patient/Caregiver Education Details Patient Name: [name] Date of Service: 03/30/2016 8:00 AM Medical Record Number: [id number] Patient Account Number: [id number] Date of Birth/Gender: September 07, 1945 (70 y.o. Male) Treating RN: [name] Primary Care Physician: Other Clinician: Referring Physician: SELF, REFERRED Treating Physician/Extender: Rudene Re in Treatment: 10 Education Assessment Education Provided To: Patient Education Topics Provided Wound/Skin Impairment: Handouts: Other: do not get wrap wet Methods: Demonstration, Explain/Verbal Responses: State content correctly Electronic Signature(s) Signed: 03/30/2016 4:13:59 PM By: Alejandro Mulling Entered By: Alejandro Mulling on 03/30/2016 08:32:05 [name] ([id number]) -------------------------------------------------------------------------------- Wound Assessment Details Patient Name: [name] Date of Service: 03/30/2016 8:00 AM Medical Record Number: [id number] Patient Account Number: [id number] Date of Birth/Sex: 02-Aug-1946 (70 y.o. Male) Treating RN: [name] Primary Care Physician: Other Clinician: Referring Physician: SELF, REFERRED Treating Physician/Extender: Rudene Re in Treatment: 10 Wound Status Wound Number: 1 Primary Venous Leg  Ulcer Etiology: Wound Location: Right Malleolus - Lateral Wound Open Wounding Event: Gradually Appeared Status: Date Acquired: 08/11/2015 Comorbid Cataracts, Chronic Obstructive Weeks Of Treatment: 10 History: Pulmonary Disease (COPD), Clustered Wound: No Osteoarthritis Wound Measurements Length: (cm) 11.3 Width: (cm) 6.3 Depth: (cm) 0.2 Area: (cm) 55.912 Volume: (cm) 11.182 % Reduction in Area: -223.6% % Reduction in Volume: -115.7% Epithelialization: Small (1-33%) Tunneling: No Undermining: No Wound Description Full Thickness Without Exposed Classification: Support Structures Wound Margin: Distinct, outline attached Exudate Large Amount: Exudate Type: Serosanguineous Exudate Color: red, brown Foul Odor After Cleansing: Yes Due to Product Use: No Wound  Bed Granulation Amount: Small (1-33%) Exposed Structure Granulation Quality: Pink Fascia Exposed: No Necrotic Amount: Large (67-100%) Fat Layer Exposed: No Necrotic Quality: Adherent Slough Tendon Exposed: No Muscle Exposed: No Joint Exposed: No Bone Exposed: No Limited to Skin Breakdown Periwound Skin Texture Texture Color No Abnormalities Noted: No No Abnormalities Noted: No Callus: No Atrophie Blanche: No [name], [name]. ([id number]) Crepitus: No Cyanosis: No Excoriation: No Ecchymosis: No Fluctuance: No Erythema: No Friable: No Hemosiderin Staining: Yes Induration: No Mottled: No Localized Edema: No Pallor: No Rash: No Rubor: No Scarring: Yes Temperature / Pain Moisture Temperature: No Abnormality No Abnormalities Noted: No Tenderness on Palpation: Yes Dry / Scaly: No Maceration: Yes Moist: Yes Wound Preparation Ulcer Cleansing: Rinsed/Irrigated with Saline, Other: SUrg Scrub and water, Topical Anesthetic Applied: None Treatment Notes Wound #1 (Right, Lateral Malleolus) 1. Cleansed with: Cleanse wound with antibacterial soap and water 3. Peri-wound Care: Barrier  cream Moisturizing lotion 4. Dressing Applied: Iodoflex 5. Secondary Dressing Applied ABD Pad Dry Gauze 7. Secured with Tape Notes kerlix, coban, xtrasorb, drawtex Electronic Signature(s) Signed: 03/30/2016 4:13:59 PM By: Alejandro Mulling Entered By: Alejandro Mulling on 03/30/2016 08:30:28 [name] ([id number]) -------------------------------------------------------------------------------- Wound Assessment Details Patient Name: [name] Date of Service: 03/30/2016 8:00 AM Medical Record Number: [id number] Patient Account Number: [id number] Date of Birth/Sex: 11/12/1945 (70 y.o. Male) Treating RN: [name] Primary Care Physician: Other Clinician: Referring Physician: SELF, REFERRED Treating Physician/Extender: Rudene Re in Treatment: 10 Wound Status Wound Number: 2 Primary Arterial Insufficiency Ulcer Etiology: Wound Location: Right Malleolus - Medial Wound Open Wounding Event: Gradually Appeared Status: Date Acquired: 01/30/2016 Comorbid Cataracts, Chronic Obstructive Weeks Of Treatment: 8 History: Pulmonary Disease (COPD), Clustered Wound: Yes Osteoarthritis Wound Measurements Length: (cm) 7 Width: (cm) 4.7 Depth: (cm) 0.2 Area: (cm) 25.84 Volume: (cm) 5.168 % Reduction in Area: -123.8% % Reduction in Volume: -347.4% Epithelialization: Small (1-33%) Tunneling: No Undermining: No Wound Description Full Thickness Without Exposed Classification: Support Structures Wound Margin: Indistinct, nonvisible Exudate Large Amount: Exudate Type: Serosanguineous Exudate Color: red, brown Foul Odor After Cleansing: Yes Due to Product Use: No Wound Bed Granulation Amount: Small (1-33%) Exposed Structure Granulation Quality: Pink Fascia Exposed: No Necrotic Amount: Large (67-100%) Fat Layer Exposed: No Necrotic Quality: Adherent Slough Tendon Exposed: No Muscle Exposed: No Joint Exposed: No Bone Exposed: No Limited to Skin  Breakdown Periwound Skin Texture Texture Color No Abnormalities Noted: No No Abnormalities Noted: No Callus: No Atrophie Blanche: No [name], [name] ([id number]) Crepitus: No Cyanosis: No Excoriation: No Ecchymosis: No Fluctuance: No Erythema: No Friable: No Hemosiderin Staining: Yes Induration: No Mottled: No Localized Edema: No Pallor: No Rash: No Rubor: No Scarring: No Temperature / Pain Moisture Temperature: No Abnormality No Abnormalities Noted: No Tenderness on Palpation: Yes Dry / Scaly: No Maceration: Yes Moist: Yes Wound Preparation Ulcer Cleansing: Rinsed/Irrigated with Saline, Other: SUrg Scrub with water, Topical Anesthetic Applied: None Treatment Notes Wound #2 (Right, Medial Malleolus) 1. Cleansed with: Cleanse wound with antibacterial soap and water 3. Peri-wound Care: Barrier cream Moisturizing lotion 4. Dressing Applied: Iodoflex 5. Secondary Dressing Applied ABD Pad Dry Gauze 7. Secured with Tape Notes kerlix, coban, xtrasorb, drawtex Electronic Signature(s) Signed: 03/30/2016 4:13:59 PM By: Alejandro Mulling Entered By: Alejandro Mulling on 03/30/2016 08:30:49

## 2016-04-03 ENCOUNTER — Encounter: Payer: Medicare HMO | Admitting: Surgery

## 2016-04-03 DIAGNOSIS — I87331 Chronic venous hypertension (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: Secondary | ICD-10-CM | POA: Diagnosis not present

## 2016-04-06 DIAGNOSIS — I87331 Chronic venous hypertension (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: Secondary | ICD-10-CM | POA: Diagnosis not present

## 2016-04-06 NOTE — Progress Notes (Signed)
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 on 04/03/2016 11:58:51 [name] ([id number]) -------------------------------------------------------------------------------- SuperBill Details Patient Name: [name] Date of Service: 04/03/2016 Medical Record Number: [id number] Patient Account Number: [id number] Date of Birth/Sex: November 12, 1945 (70 y.o. 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 Facility Procedures CPT4: Description Modifier Quantity Code 16109604 11042 - DEB SUBQ TISSUE 20 SQ CM/<  1 ICD-10 Description Diagnosis I87.331 Chronic venous hypertension (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 T85.613A Breakdown (mechanical) of  artificial skin graft and decellularized allodermis, initial encounter 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 CPT4: 54098119 11045 - DEB SUBQ TISS EA ADDL 20CM 3 ICD-10 Description Diagnosis I87.331 Chronic venous hypertension (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 T85.613A Breakdown (mechanical) of artificial skin graft and  decellularized allodermis, initial encounter 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

## 2016-04-06 NOTE — Progress Notes (Signed)
ISAID, SALVIA ([id number]) Visit Report for 04/03/2016 Arrival Information Details Patient Name: [name], [name]. Date of Service: 04/03/2016 8:00 AM Medical Record Number: [id number] Patient Account Number: [id number] Date of Birth/Sex: 04-Dec-1945 (70 y.o. Male) Treating RN: Huel Coventry Primary Care Physician: Other Clinician: Referring Physician: SELF, REFERRED Treating Physician/Extender: Rudene Re in Treatment: 11 Visit Information History Since Last Visit Added or deleted any medications: No Patient Arrived: Ambulatory Any new allergies or adverse reactions: No Arrival Time: 08:02 Had a fall or experienced change in No Accompanied By: self activities of daily living that may affect Transfer Assistance: None risk of falls: Patient Identification Verified: Yes Signs or symptoms of abuse/neglect since last No Secondary Verification Process Yes visito Completed: Hospitalized since last visit: No Patient Requires Transmission- No Has Dressing in Place as Prescribed: Yes Based Precautions: Has Compression in Place as Prescribed: Yes Patient Has Alerts: Yes Has Footwear/Offloading in Place as Yes Patient Alerts: Patient on Blood Prescribed: Thinner Pain Present Now: No Electronic Signature(s) Signed: 04/03/2016 11:24:36 AM By: Elliot Gurney, RN, BSN, Kim RN, BSN Entered By: Elliot Gurney, RN, BSN, Kim on 04/03/2016 11:24:36 [name] ([id number]) -------------------------------------------------------------------------------- Clinic Level of Care Assessment Details Patient Name: [name] Date of Service: 04/03/2016 8:00 AM Medical Record Number: [id number] Patient Account Number: [id number] Date of Birth/Sex: 12-Feb-1946 (70 y.o. Male) Treating RN: Huel Coventry Primary Care Physician: Other Clinician: Referring Physician: SELF, REFERRED Treating Physician/Extender: Rudene Re in Treatment: 11 Clinic Level of Care Assessment Items TOOL 1  Quantity Score []  - Use when EandM and Procedure is performed on INITIAL visit 0 ASSESSMENTS - Nursing Assessment / Reassessment []  - General Physical Exam (combine w/ comprehensive assessment (listed just 0 below) when performed on new pt. evals) []  - Comprehensive Assessment (HX, ROS, Risk Assessments, Wounds Hx, etc.) 0 ASSESSMENTS - Wound and Skin Assessment / Reassessment []  - Dermatologic / Skin Assessment (not related to wound area) 0 ASSESSMENTS - Ostomy and/or Continence Assessment and Care []  - Incontinence Assessment and Management 0 []  - Ostomy Care Assessment and Management (repouching, etc.) 0 PROCESS - Coordination of Care []  - Simple Patient / Family Education for ongoing care 0 []  - Complex (extensive) Patient / Family Education for ongoing care 0 []  - Staff obtains Chiropractor, Records, Test Results / Process Orders 0 []  - Staff telephones HHA, Nursing Homes / Clarify orders / etc 0 []  - Routine Transfer to another Facility (non-emergent condition) 0 []  - Routine Hospital Admission (non-emergent condition) 0 []  - New Admissions / Manufacturing engineer / Ordering NPWT, Apligraf, etc. 0 []  - Emergency Hospital Admission (emergent condition) 0 PROCESS - Special Needs []  - Pediatric / Minor Patient Management 0 []  - Isolation Patient Management 0 SUSAN, ARANA ([id number]) []  - Hearing / Language / Visual special needs 0 []  - Assessment of Community assistance (transportation, D/C planning, etc.) 0 []  - Additional assistance / Altered mentation 0 []  - Support Surface(s) Assessment (bed, cushion, seat, etc.) 0 INTERVENTIONS - Miscellaneous []  - External ear exam 0 []  - Patient Transfer (multiple staff / Nurse, adult / Similar devices) 0 []  - Simple Staple / Suture removal (25 or less) 0 []  - Complex Staple / Suture removal (26 or more) 0 []  - Hypo/Hyperglycemic Management (do not check if billed separately) 0 []  - Ankle / Brachial Index (ABI) - do not check if  billed separately 0 Has the patient been seen at the hospital within the last three years: Yes Total Score: 0 Level Of  Care: ____ Electronic Signature(s) Signed: 04/05/2016 5:15:55 PM By: Elliot GurneyWoody, RN, BSN, Kim RN, BSN Entered By: Elliot GurneyWoody, RN, BSN, Kim on 04/03/2016 11:47:43 [name] StackBLACKWELL, [name] ([id number]) -------------------------------------------------------------------------------- Encounter Discharge Information Details Patient Name: [name] StackBLACKWELL, [name] Date of Service: 04/03/2016 8:00 AM Medical Record Number: [id number] Patient Account Number: [id number] Date of Birth/Sex: 1946/07/23 [age] y.o. Male) Treating RN: Huel CoventryWoody, Kim Primary Care Physician: Other Clinician: Referring Physician: SELF, REFERRED Treating Physician/Extender: Rudene ReBritto, Errol Weeks in Treatment: 11 Encounter Discharge Information Items Discharge Pain Level: 0 Discharge Condition: Stable Ambulatory Status: Ambulatory Discharge Destination: Home Transportation: Private Auto Accompanied By: self Schedule Follow-up Appointment: Yes Medication Reconciliation completed and provided to Patient/Care Yes Honora Searson: Provided on Clinical Summary of Care: 04/03/2016 Form Type Recipient Paper Patient LB Electronic Signature(s) Signed: 04/03/2016 11:53:53 AM By: Elliot GurneyWoody, RN, BSN, Kim RN, BSN Previous Signature: 04/03/2016 8:42:15 AM Version By: Gwenlyn PerkingMoore, Shelia Entered By: Elliot GurneyWoody, RN, BSN, Kim on 04/03/2016 11:53:52 [name] StackBLACKWELL, [name] ([id number]) -------------------------------------------------------------------------------- Lower Extremity Assessment Details Patient Name: [name] StackBLACKWELL, [name] Date of Service: 04/03/2016 8:00 AM Medical Record Number: [id number] Patient Account Number: [id number] Date of Birth/Sex: 1946/07/23 [age] y.o. Male) Treating RN: Huel CoventryWoody, Kim Primary Care Physician: Other Clinician: Referring Physician: SELF, REFERRED Treating Physician/Extender: Rudene ReBritto, Errol Weeks in Treatment: 11 Edema  Assessment Assessed: [Left: No] [Right: No] E[Left: dema] [Right: :] Calf Left: Right: Point of Measurement: 40 cm From Medial Instep cm 39 cm Ankle Left: Right: Point of Measurement: 12 cm From Medial Instep cm 26.5 cm Vascular Assessment Pulses: Posterior Tibial Dorsalis Pedis Palpable: [Right:Yes] Extremity colors, hair growth, and conditions: Extremity Color: [Right:Hyperpigmented] Hair Growth on Extremity: [Right:No] Temperature of Extremity: [Right:Warm] Capillary Refill: [Right:< 3 seconds] Toe Nail Assessment Left: Right: Thick: Yes Discolored: Yes Deformed: Yes Improper Length and Hygiene: No Electronic Signature(s) Signed: 04/05/2016 5:15:55 PM By: Elliot GurneyWoody, RN, BSN, Kim RN, BSN Entered By: Elliot GurneyWoody, RN, BSN, Kim on 04/03/2016 02:72:5308:09:24 [name] StackBLACKWELL, Meer C. ([id number]) -------------------------------------------------------------------------------- Multi Wound Chart Details Patient Name: [name] StackBLACKWELL, [name] Date of Service: 04/03/2016 8:00 AM Medical Record Number: [id number] Patient Account Number: [id number] Date of Birth/Sex: 1946/07/23 [age] y.o. 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 Comorbid History: Cataracts, Chronic Cataracts, Chronic N/A Obstructive Pulmonary Obstructive Pulmonary Disease (COPD), Disease (COPD), Osteoarthritis Osteoarthritis Date Acquired: 08/11/2015 01/30/2016 N/A Weeks of Treatment: 11 9 N/A Wound Status: Open Open  N/A Clustered Wound: No Yes N/A Measurements L x W x D 6x7x0.2 7.5x4.5x0.3 N/A (cm) Area (cm) : 32.987 26.507 N/A Volume (cm) : 6.597 7.952 N/A % Reduction in Area: -90.90% -129.60% N/A % Reduction in Volume: -27.30% -588.50% N/A Classification: Full Thickness Without Full Thickness Without N/A Exposed Support Exposed Support Structures Structures Exudate Amount: Large Large N/A Exudate Type: Serous Serous N/A Exudate Color: amber amber N/A [name] StackBLACKWELL, [name] ([id number]) Foul Odor After Yes Yes N/A Cleansing: Odor Anticipated Due to No No N/A Product Use: Wound Margin: Distinct, outline attached Indistinct, nonvisible N/A Granulation Amount: Medium (34-66%) Medium (34-66%) N/A Granulation Quality: Pink Pink N/A Necrotic Amount: Medium (34-66%) Medium (34-66%) N/A Exposed Structures: Fascia: No Fascia: No N/A Fat: No Fat: No Tendon: No Tendon: No Muscle:  No Muscle: No Joint: No Joint: No Bone: No Bone: No Limited to Skin Limited to Skin Breakdown Breakdown Epithelialization: Small (1-33%) Small (1-33%) N/A Debridement: Debridement (45409- Debridement (81191- N/A 11047) 11047) Time-Out Taken: Yes Yes N/A Pain Control: Other Other N/A Tissue Debrided: Fibrin/Slough, Exudates, Fibrin/Slough, Exudates, N/A Subcutaneous Subcutaneous Level: Skin/Subcutaneous Skin/Subcutaneous N/A Tissue Tissue Debridement Area (sq 42 33.75 N/A cm): Instrument: Curette Curette N/A Bleeding: Minimum Minimum N/A Hemostasis Achieved: Pressure Pressure N/A Procedural Pain: 2 2 N/A Post Procedural Pain: 2 2 N/A Debridement Treatment Procedure was tolerated Procedure was tolerated N/A Response: well well Post Debridement 6x7x0.2 7.5x4.5x0.3 N/A Measurements L x W x D (cm) Post Debridement 6.597 7.952 N/A Volume: (cm) Periwound Skin Texture: Scarring: Yes Edema: No N/A Edema: No Excoriation: No Excoriation: No Induration: No Induration: No Callus: No Callus: No Crepitus:  No Crepitus: No Fluctuance: No Fluctuance: No Friable: No Friable: No Rash: No Rash: No Scarring: No Periwound Skin N/A Moisture: [name], [name] ([id number]) Maceration: Yes Maceration: Yes Moist: Yes Moist: Yes Dry/Scaly: No Dry/Scaly: No Periwound Skin Color: Hemosiderin Staining: Yes Hemosiderin Staining: Yes N/A Atrophie Blanche: No Atrophie Blanche: No Cyanosis: No Cyanosis: No Ecchymosis: No Ecchymosis: No Erythema: No Erythema: No Mottled: No Mottled: No Pallor: No Pallor: No Rubor: No Rubor: No Temperature: No Abnormality No Abnormality N/A Tenderness on Yes Yes N/A Palpation: Wound Preparation: Ulcer Cleansing: Ulcer Cleansing: N/A Rinsed/Irrigated with Rinsed/Irrigated with Saline, Other: soap and Saline, Other: soap with water water Topical Anesthetic Topical Anesthetic Applied: Other: lidocaine Applied: Other: lidocaine 4% 4% Procedures Performed: Debridement Debridement N/A Treatment Notes Wound #1 (Right, Lateral Malleolus) 1. Cleansed with: Clean wound with Normal Saline 2. Anesthetic Topical Lidocaine 4% cream to wound bed prior to debridement 3. Peri-wound Care: Barrier cream 4. Dressing Applied: Aquacel Ag 5. Secondary Dressing Applied ABD Pad 7. Secured with 3 Layer Compression System - Right Lower Extremity Wound #2 (Right, Medial Malleolus) 1. Cleansed with: Clean wound with Normal Saline 2. Anesthetic Topical Lidocaine 4% cream to wound bed prior to debridement 3. Peri-wound Care: Barrier cream 4. Dressing Applied: Aquacel Ag [name], [name] ([id number]) 5. Secondary Dressing Applied ABD Pad 7. Secured with 3 Layer Compression System - Right Lower Extremity Electronic Signature(s) Signed: 04/03/2016 11:36:33 AM By: Elliot Gurney, RN, BSN, Kim RN, BSN Entered By: Elliot Gurney, RN, BSN, Kim on 04/03/2016 11:36:33 [name]  ([id number]) -------------------------------------------------------------------------------- Multi-Disciplinary Care Plan Details Patient Name: [name] Date of Service: 04/03/2016 8:00 AM Medical Record Number: [id number] Patient Account Number: [id number] Date of Birth/Sex: [birth year] (70 y.o. Male) Treating RN: Huel Coventry Primary Care Physician: Other Clinician: Referring Physician: SELF, REFERRED Treating Physician/Extender: Rudene Re in Treatment: 11 Active Inactive Venous Leg Ulcer Nursing Diagnoses: Knowledge deficit related to disease process and management Goals: Patient will maintain optimal edema control Date Initiated: 04/03/2016 Goal Status: Active Interventions: Compression as ordered Treatment Activities: Therapeutic compression applied : 04/03/2016 Notes: Wound/Skin Impairment Nursing Diagnoses: Impaired tissue integrity Goals: Ulcer/skin breakdown will heal within 14 weeks Date Initiated: 01/17/2016 Goal Status: Active Interventions: Assess ulceration(s) every visit Notes: Electronic Signature(s) Signed: 04/03/2016 11:32:28 AM By: Elliot Gurney, RN, BSN, Kim RN, BSN Entered By: Elliot Gurney, RN, BSN, Kim on 04/03/2016 11:32:26 [name] ([name], [name] ([id number]) -------------------------------------------------------------------------------- Pain Assessment Details Patient Name: [name] Date of Service: 04/03/2016 8:00 AM Medical Record Number: [id number] Patient Account Number: [id number] Date of Birth/Sex: 1945-10-30 (70 y.o. Male) Treating RN: Huel Coventry Primary Care Physician: Other Clinician: Referring Physician: SELF, REFERRED  Treating Physician/Extender: Rudene Re in Treatment: 11 Active Problems Location of Pain Severity and Description of Pain Patient Has Paino No Site Locations Pain Management and Medication Current Pain Management: Electronic Signature(s) Signed: 04/05/2016 5:15:55 PM  By: Elliot Gurney, RN, BSN, Kim RN, BSN Entered By: Elliot Gurney, RN, BSN, Kim on 04/03/2016 08:03:07 [name] ([id number]) -------------------------------------------------------------------------------- Patient/Caregiver Education Details Patient Name: [name] Date of Service: 04/03/2016 8:00 AM Medical Record Number: [id number] Patient Account Number: [id number] Date of Birth/Gender: 07-22-1946 (70 y.o. Male) Treating RN: Huel Coventry Primary Care Physician: Other Clinician: Referring Physician: SELF, REFERRED Treating Physician/Extender: Rudene Re in Treatment: 11 Education Assessment Education Provided To: Patient Education Topics Provided Infection: Venous: Handouts: Controlling Swelling with Multilayered Compression Wraps Methods: Demonstration, Explain/Verbal Responses: State content correctly Electronic Signature(s) Signed: 04/05/2016 5:15:55 PM By: Elliot Gurney, RN, BSN, Kim RN, BSN Entered By: Elliot Gurney, RN, BSN, Kim on 04/03/2016 11:59:10 [name] ([id number]) -------------------------------------------------------------------------------- Wound Assessment Details Patient Name: [name] Date of Service: 04/03/2016 8:00 AM Medical Record Number: [id number] Patient Account Number: [id number] Date of Birth/Sex: [birth year] (70 y.o. Male) Treating RN: Huel Coventry Primary Care Physician: Other Clinician: Referring Physician: SELF, REFERRED Treating Physician/Extender: Rudene Re in Treatment: 11 Wound Status Wound Number: 1 Primary Venous Leg Ulcer Etiology: Wound Location: Right Malleolus - Lateral Wound Open Wounding Event: Gradually Appeared Status: Date Acquired: 08/11/2015 Comorbid Cataracts, Chronic Obstructive Weeks Of Treatment: 11 History: Pulmonary Disease (COPD), Clustered Wound: No Osteoarthritis Photos Wound Measurements Length: (cm) 6 Width: (cm) 7 Depth: (cm) 0.2 Area: (cm) 32.987 Volume: (cm) 6.597 %  Reduction in Area: -90.9% % Reduction in Volume: -27.3% Epithelialization: Small (1-33%) Tunneling: No Undermining: No Wound Description Full Thickness Without Exposed Foul Odor Afte Classification: Support Structures Due to Product Wound Margin: Distinct, outline attached Exudate Large Amount: Exudate Type: Serous Exudate Color: amber r Cleansing: Yes Use: No Wound Bed Granulation Amount: Medium (34-66%) Exposed Structure Granulation Quality: Pink Fascia Exposed: No Necrotic Amount: Medium (34-66%) Fat Layer Exposed: No [name], [name] ([id number]) Necrotic Quality: Adherent Slough Tendon Exposed: No Muscle Exposed: No Joint Exposed: No Bone Exposed: No Limited to Skin Breakdown Periwound Skin Texture Texture Color No Abnormalities Noted: No No Abnormalities Noted: No Callus: No Atrophie Blanche: No Crepitus: No Cyanosis: No Excoriation: No Ecchymosis: No Fluctuance: No Erythema: No Friable: No Hemosiderin Staining: Yes Induration: No Mottled: No Localized Edema: No Pallor: No Rash: No Rubor: No Scarring: Yes Temperature / Pain Moisture Temperature: No Abnormality No Abnormalities Noted: No Tenderness on Palpation: Yes Dry / Scaly: No Maceration: Yes Moist: Yes Wound Preparation Ulcer Cleansing: Rinsed/Irrigated with Saline, Other: soap and water, Topical Anesthetic Applied: Other: lidocaine 4%, Treatment Notes Wound #1 (Right, Lateral Malleolus) 1. Cleansed with: Clean wound with Normal Saline 2. Anesthetic Topical Lidocaine 4% cream to wound bed prior to debridement 3. Peri-wound Care: Barrier cream 4. Dressing Applied: Aquacel Ag 5. Secondary Dressing Applied ABD Pad 7. Secured with 3 Layer Compression System - Right Lower Extremity Electronic Signature(s) Signed: 04/05/2016 5:15:55 PM By: Elliot Gurney, RN, BSN, Kim RN, BSN Entered By: Elliot Gurney, RN, BSN, Kim on 04/03/2016 08:14:42 [name] ([id number]) [name], [name]  ([id number]) -------------------------------------------------------------------------------- Wound Assessment Details Patient Name: [name] Date of Service: 04/03/2016 8:00 AM Medical Record Number: [id number] Patient Account Number: [id number] Date of Birth/Sex: 1946-02-03 (70 y.o. Male) Treating RN: Huel Coventry Primary Care Physician: Other Clinician: Referring Physician: SELF, REFERRED Treating Physician/Extender: Rudene Re in Treatment: 11 Wound Status Wound Number: 2  Primary Arterial Insufficiency Ulcer Etiology: Wound Location: Right Malleolus - Medial Wound Open Wounding Event: Gradually Appeared Status: Date Acquired: 01/30/2016 Comorbid Cataracts, Chronic Obstructive Weeks Of Treatment: 9 History: Pulmonary Disease (COPD), Clustered Wound: Yes Osteoarthritis Photos Wound Measurements Length: (cm) 7.5 Width: (cm) 4.5 Depth: (cm) 0.3 Area: (cm) 26.507 Volume: (cm) 7.952 % Reduction in Area: -129.6% % Reduction in Volume: -588.5% Epithelialization: Small (1-33%) Tunneling: No Undermining: No Wound Description Full Thickness Without Exposed Foul Odor Afte Classification: Support Structures Due to Product Wound Margin: Indistinct, nonvisible Exudate Large Amount: Exudate Type: Serous Exudate Color: amber r Cleansing: Yes Use: No Wound Bed Granulation Amount: Medium (34-66%) Exposed Structure Granulation Quality: Pink Fascia Exposed: No Necrotic Amount: Medium (34-66%) Fat Layer Exposed: No [name], Callan C. ([id number]) Necrotic Quality: Adherent Slough Tendon Exposed: No Muscle Exposed: No Joint Exposed: No Bone Exposed: No Limited to Skin Breakdown Periwound Skin Texture Texture Color No Abnormalities Noted: No No Abnormalities Noted: No Callus: No Atrophie Blanche: No Crepitus: No Cyanosis: No Excoriation: No Ecchymosis: No Fluctuance: No Erythema: No Friable: No Hemosiderin Staining: Yes Induration:  No Mottled: No Localized Edema: No Pallor: No Rash: No Rubor: No Scarring: No Temperature / Pain Moisture Temperature: No Abnormality No Abnormalities Noted: No Tenderness on Palpation: Yes Dry / Scaly: No Maceration: Yes Moist: Yes Wound Preparation Ulcer Cleansing: Rinsed/Irrigated with Saline, Other: soap with water, Topical Anesthetic Applied: Other: lidocaine 4%, Treatment Notes Wound #2 (Right, Medial Malleolus) 1. Cleansed with: Clean wound with Normal Saline 2. Anesthetic Topical Lidocaine 4% cream to wound bed prior to debridement 3. Peri-wound Care: Barrier cream 4. Dressing Applied: Aquacel Ag 5. Secondary Dressing Applied ABD Pad 7. Secured with 3 Layer Compression System - Right Lower Extremity Electronic Signature(s) Signed: 04/05/2016 5:15:55 PM By: Elliot Gurney, RN, BSN, Kim RN, BSN Entered By: Elliot Gurney, RN, BSN, Kim on 04/03/2016 08:15:32 [name] ([id number]) [name], [name] ([id number]) -------------------------------------------------------------------------------- Vitals Details Patient Name: [name] Date of Service: 04/03/2016 8:00 AM Medical Record Number: [id number] Patient Account Number: [id number] Date of Birth/Sex: 01-Sep-1945 (70 y.o. Male) Treating RN: Huel Coventry Primary Care Physician: Other Clinician: Referring Physician: SELF, REFERRED Treating Physician/Extender: Rudene Re in Treatment: 11 Vital Signs Time Taken: 08:03 Temperature (F): 98.0 Height (in): 76 Pulse (bpm): 75 Weight (lbs): 238 Respiratory Rate (breaths/min): 20 Body Mass Index (BMI): 29 Blood Pressure (mmHg): 143/69 Reference Range: 80 - 120 mg / dl Electronic Signature(s) Signed: 04/05/2016 5:15:55 PM By: Elliot Gurney, RN, BSN, Kim RN, BSN Entered By: Elliot Gurney, RN, BSN, Kim on 04/03/2016 62:95:28

## 2016-04-06 NOTE — Progress Notes (Addendum)
[name], Jayjay C. ([id number]) Visit Report for 04/06/2016 Arrival Information Details Patient Name: [name], [name] Date of Service: 04/06/2016 2:15 PM Medical Record Number: [id number] Patient Account Number: [id number] Date of Birth/Sex: 03/25/1946 [age] y.o. Male) Treating RN: Clover MealyAfful, RN, BSN, [place] Primary Care Physician: Other Clinician: Referring Physician: SELF, REFERRED Treating Physician/Extender: Rudene ReBritto, Errol Weeks in Treatment: 11 Visit Information History Since Last Visit All ordered tests and consults were completed: No Patient Arrived: Ambulatory Added or deleted any medications: No Arrival Time: 14:20 Any new allergies or adverse reactions: No Accompanied By: self Had a fall or experienced change in No Transfer Assistance: None activities of daily living that may affect Patient Identification Verified: Yes risk of falls: Secondary Verification Process Yes Signs or symptoms of abuse/neglect since last No Completed: visito Patient Requires Transmission- No Hospitalized since last visit: No Based Precautions: Has Dressing in Place as Prescribed: Yes Patient Has Alerts: Yes Has Compression in Place as Prescribed: Yes Patient Alerts: Patient on Blood Pain Present Now: No Thinner Electronic Signature(s) Signed: 04/06/2016 2:48:14 PM By: Elpidio EricAfful, Rita BSN, RN Entered By: Elpidio EricAfful, Rita on 04/06/2016 14:48:13 [name], [name] ([id number]) -------------------------------------------------------------------------------- Encounter Discharge Information Details Patient Name: [name], [name] Date of Service: 04/06/2016 2:15 PM Medical Record Number: [id number] Patient Account Number: [id number] Date of Birth/Sex: 03/25/1946 [age] y.o. Male) Treating RN: Clover MealyAfful, RN, BSN, [place] Primary Care Physician: Other Clinician: Referring Physician: SELF, REFERRED Treating Physician/Extender: Rudene ReBritto, Errol Weeks in Treatment: 11 Encounter Discharge Information Items Discharge  Pain Level: 0 Discharge Condition: Stable Ambulatory Status: Ambulatory Discharge Destination: Home Private Transportation: Auto Accompanied By: self Schedule Follow-up Appointment: No Medication Reconciliation completed and No provided to Patient/Care Timarie Labell: Clinical Summary of Care: Electronic Signature(s) Signed: 04/06/2016 2:49:53 PM By: Elpidio EricAfful, Rita BSN, RN Entered By: Elpidio EricAfful, Rita on 04/06/2016 14:49:53 [name], [name] ([id number]) -------------------------------------------------------------------------------- Patient/Caregiver Education Details Patient Name: [name], [name] Date of Service: 04/06/2016 2:15 PM Medical Record Number: [id number] Patient Account Number: [id number] Date of Birth/Gender: 03/25/1946 [age] y.o. Male) Treating RN: Clover MealyAfful, RN, BSN, [place] Primary Care Physician: Other Clinician: Referring Physician: SELF, REFERRED Treating Physician/Extender: Rudene ReBritto, Errol Weeks in Treatment: 11 Education Assessment Education Provided To: Patient Education Topics Provided Wound/Skin Impairment: Methods: Explain/Verbal Responses: State content correctly Electronic Signature(s) Signed: 04/06/2016 2:56:07 PM By: Elpidio EricAfful, Rita BSN, RN Entered By: Elpidio EricAfful, Rita on 04/06/2016 14:49:41 [name], [name] ([id number]) -------------------------------------------------------------------------------- Wound Assessment Details Patient Name: [name], [name] Date of Service: 04/06/2016 2:15 PM Medical Record Number: [id number] Patient Account Number: [id number] Date of Birth/Sex: 03/25/1946 [age] y.o. Male) Treating RN: Clover MealyAfful, RN, BSN, Rita Primary Care Physician: Other Clinician: Referring Physician: SELF, REFERRED Treating Physician/Extender: Rudene ReBritto, Errol Weeks in Treatment: 11 Wound Status Wound Number: 1 Primary Venous Leg Ulcer Etiology: Wound Location: Right Malleolus - Lateral Wound Open Wounding Event: Gradually Appeared Status: Date Acquired:  08/11/2015 Comorbid Cataracts, Chronic Obstructive Weeks Of Treatment: 11 History: Pulmonary Disease (COPD), Clustered Wound: No Osteoarthritis Wound Measurements Length: (cm) 6 Width: (cm) 6 Depth: (cm) 0.2 Area: (cm) 28.274 Volume: (cm) 5.655 % Reduction in Area: -63.6% % Reduction in Volume: -9.1% Epithelialization: Small (1-33%) Tunneling: No Undermining: No Wound Description Full Thickness Without Exposed Foul Odor Afte Classification: Support Structures Due to Product Wound Margin: Distinct, outline attached Exudate Large Amount: Exudate Type: Serous Exudate Color: amber r Cleansing: Yes Use: No Wound Bed Granulation Amount: Medium (34-66%) Exposed Structure Granulation Quality: Pink Fascia Exposed: No Necrotic Amount: Medium (34-66%) Fat Layer Exposed: No Necrotic Quality: Adherent Slough Tendon Exposed: No Muscle Exposed:  No Joint Exposed: No Bone Exposed: No Limited to Skin Breakdown Periwound Skin Texture Texture Color No Abnormalities Noted: No No Abnormalities Noted: No Callus: No Atrophie Blanche: No SAGE, KOPERA. ([id number]) Crepitus: No Cyanosis: No Excoriation: No Ecchymosis: No Fluctuance: No Erythema: No Friable: No Hemosiderin Staining: Yes Induration: No Mottled: No Localized Edema: No Pallor: No Rash: No Rubor: No Scarring: Yes Temperature / Pain Moisture Temperature: No Abnormality No Abnormalities Noted: No Tenderness on Palpation: Yes Dry / Scaly: No Maceration: Yes Moist: Yes Wound Preparation Ulcer Cleansing: Rinsed/Irrigated with Saline, Other: soap and water, Topical Anesthetic Applied: Other: lidocaine 4%, Treatment Notes Wound #1 (Right, Lateral Malleolus) 1. Cleansed with: Clean wound with Normal Saline 2. Anesthetic Topical Lidocaine 4% cream to wound bed prior to debridement 3. Peri-wound Care: Barrier cream 4. Dressing Applied: Aquacel Ag 5. Secondary Dressing Applied ABD Pad 7. Secured  with 3 Layer Compression System - Right Lower Extremity Electronic Signature(s) Signed: 04/06/2016 2:56:07 PM By: [name] BSN, RN Entered By: [name] on 04/06/2016 14:48:51 [name] ([id number]) -------------------------------------------------------------------------------- Wound Assessment Details Patient Name: [name] Date of Service: 04/06/2016 2:15 PM Medical Record Number: [id number] Patient Account Number: [id number] Date of Birth/Sex: 1945/12/09 (70 y.o. Male) Treating RN: [name], RN, BSN, Rita Primary Care Physician: Other Clinician: Referring Physician: SELF, REFERRED Treating Physician/Extender: Rudene Re in Treatment: 11 Wound Status Wound Number: 2 Primary Arterial Insufficiency Ulcer Etiology: Wound Location: Right Malleolus - Medial Wound Open Wounding Event: Gradually Appeared Status: Date Acquired: 01/30/2016 Comorbid Cataracts, Chronic Obstructive Weeks Of Treatment: 9 History: Pulmonary Disease (COPD), Clustered Wound: Yes Osteoarthritis Wound Measurements Length: (cm) 7.5 Width: (cm) 4.5 Depth: (cm) 0.3 Area: (cm) 26.507 Volume: (cm) 7.952 % Reduction in Area: -129.6% % Reduction in Volume: -588.5% Epithelialization: Small (1-33%) Tunneling: No Undermining: No Wound Description Full Thickness Without Exposed Classification: Support Structures Wound Margin: Indistinct, nonvisible Exudate Large Amount: Exudate Type: Serous Exudate Color: amber Foul Odor After Cleansing: Yes Due to Product Use: No Wound Bed Granulation Amount: Medium (34-66%) Exposed Structure Granulation Quality: Pink Fascia Exposed: No Necrotic Amount: Medium (34-66%) Fat Layer Exposed: No Necrotic Quality: Adherent Slough Tendon Exposed: No Muscle Exposed: No Joint Exposed: No Bone Exposed: No Limited to Skin Breakdown Periwound Skin Texture Texture Color No Abnormalities Noted: No No Abnormalities Noted: No Callus:  No Atrophie Blanche: No Hatton, Darick C. ([id number]) Crepitus: No Cyanosis: No Excoriation: No Ecchymosis: No Fluctuance: No Erythema: No Friable: No Hemosiderin Staining: Yes Induration: No Mottled: No Localized Edema: No Pallor: No Rash: No Rubor: No Scarring: No Temperature / Pain Moisture Temperature: No Abnormality No Abnormalities Noted: No Tenderness on Palpation: Yes Dry / Scaly: No Maceration: Yes Moist: Yes Wound Preparation Ulcer Cleansing: Rinsed/Irrigated with Saline, Other: soap with water, Topical Anesthetic Applied: Other: lidocaine 4%, Treatment Notes Wound #2 (Right, Medial Malleolus) 1. Cleansed with: Clean wound with Normal Saline 2. Anesthetic Topical Lidocaine 4% cream to wound bed prior to debridement 3. Peri-wound Care: Barrier cream 4. Dressing Applied: Aquacel Ag 5. Secondary Dressing Applied ABD Pad 7. Secured with 3 Layer Compression System - Right Lower Extremity Electronic Signature(s) Signed: 04/06/2016 2:56:07 PM By: [name] BSN, RN Entered By: [name] on 04/06/2016 14:49:06

## 2016-04-10 ENCOUNTER — Encounter: Payer: Medicare HMO | Admitting: Surgery

## 2016-04-10 DIAGNOSIS — I87331 Chronic venous hypertension (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: Secondary | ICD-10-CM | POA: Diagnosis not present

## 2016-04-13 DIAGNOSIS — I87331 Chronic venous hypertension (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: Secondary | ICD-10-CM | POA: Diagnosis not present

## 2016-04-13 NOTE — Progress Notes (Signed)
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 Patient Account Number: [id number] Date of Birth/Sex: [birth year](69 y.o. 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 Facility Procedures CPT4: Description Modifier Quantity Code [id number] 11042 - DEB SUBQ TISSUE 20 SQ CM/< 1 ICD-10 Description Diagnosis I87.331 Chronic venous hypertension (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 T85.613A Breakdown (mechanical) of  artificial skin graft and decellularized allodermis, initial encounter 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 CPT4: 40981191 11045 - DEB SUBQ TISS EA ADDL 20CM 2 ICD-10 Description Diagnosis I87.331 Chronic venous hypertension (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 T85.613A  Breakdown (mechanical) of artificial skin graft and  decellularized allodermis, initial encounter 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

## 2016-04-13 NOTE — Progress Notes (Signed)
[name] ([id number]) Visit Report for 04/10/2016 Arrival Information Details Patient Name: [name], [name]. Date of Service: 04/10/2016 8:00 AM Medical Record Number: [id number] Patient Account Number: [id number] Date of Birth/Sex: 02-17-1946 (70 y.o. Male) Treating RN: Huel Coventry Primary Care Physician: Other Clinician: Referring Physician: SELF, REFERRED Treating Physician/Extender: Rudene Re in Treatment: 12 Visit Information History Since Last Visit Added or deleted any medications: No Patient Arrived: Ambulatory Any new allergies or adverse reactions: No Arrival Time: 08:00 Had a fall or experienced change in No Accompanied By: wife activities of daily living that may affect Transfer Assistance: None risk of falls: Patient Identification Verified: Yes Signs or symptoms of abuse/neglect since last No Secondary Verification Process Yes visito Completed: Hospitalized since last visit: No Patient Requires Transmission- No Has Dressing in Place as Prescribed: Yes Based Precautions: Has Compression in Place as Prescribed: Yes Patient Has Alerts: Yes Has Footwear/Offloading in Place as Yes Patient Alerts: Patient on Blood Prescribed: Thinner Right: Wedge Shoe Pain Present Now: No Electronic Signature(s) Signed: 04/12/2016 5:06:33 PM By: Elliot Gurney, RN, BSN, Kim RN, BSN Entered By: Elliot Gurney, RN, BSN, Kim on 04/10/2016 08:01:09 [name] ([id number]) -------------------------------------------------------------------------------- Encounter Discharge Information Details Patient Name: [name] Date of Service: 04/10/2016 8:00 AM Medical Record Number: [id number] Patient Account Number: [id number] Date of Birth/Sex: 1946-07-07 (70 y.o. Male) Treating RN: Huel Coventry Primary Care Physician: Other Clinician: Referring Physician: SELF, REFERRED Treating Physician/Extender: Rudene Re in Treatment: 12 Encounter Discharge  Information Items Discharge Pain Level: 0 Discharge Condition: Stable Ambulatory Status: Ambulatory Discharge Destination: Home Transportation: Private Auto Accompanied By: self Schedule Follow-up Appointment: Yes Medication Reconciliation completed Yes and provided to Patient/Care Reah Justo: Patient Clinical Summary of Care: Declined Electronic Signature(s) Signed: 04/12/2016 5:06:33 PM By: Elliot Gurney RN, BSN, Kim RN, BSN Previous Signature: 04/10/2016 8:41:31 AM Version By: Gwenlyn Perking Entered By: Elliot Gurney RN, BSN, Kim on 04/10/2016 08:44:09 [name] ([id number]) -------------------------------------------------------------------------------- Lower Extremity Assessment Details Patient Name: [name] Date of Service: 04/10/2016 8:00 AM Medical Record Number: [id number] Patient Account Number: [id number] Date of Birth/Sex: 1945/11/05 (70 y.o. Male) Treating RN: Huel Coventry Primary Care Physician: Other Clinician: Referring Physician: SELF, REFERRED Treating Physician/Extender: Rudene Re in Treatment: 12 Edema Assessment Assessed: [Left: No] [Right: No] E[Left: dema] [Right: :] Calf Left: Right: Point of Measurement: 40 cm From Medial Instep cm 36.4 cm Ankle Left: Right: Point of Measurement: 12 cm From Medial Instep cm 25 cm Vascular Assessment Pulses: Posterior Tibial Dorsalis Pedis Palpable: [Right:Yes] Extremity colors, hair growth, and conditions: Extremity Color: [Right:Hyperpigmented] Hair Growth on Extremity: [Right:No] Temperature of Extremity: [Right:Warm] Capillary Refill: [Right:< 3 seconds] Toe Nail Assessment Left: Right: Thick: Yes Discolored: Yes Deformed: Yes Improper Length and Hygiene: No Electronic Signature(s) Signed: 04/12/2016 5:06:33 PM By: Elliot Gurney, RN, BSN, Kim RN, BSN Entered By: Elliot Gurney, RN, BSN, Kim on 04/10/2016 08:08:17 [name]  ([id number]) -------------------------------------------------------------------------------- Multi Wound Chart Details Patient Name: [name] Date of Service: 04/10/2016 8:00 AM Medical Record Number: [id number] Patient Account Number: [id number] Date of Birth/Sex: 03-19-1946 (70 y.o. 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 Comorbid  History: Cataracts, Chronic Cataracts, Chronic N/A Obstructive Pulmonary Obstructive Pulmonary Disease (COPD), Disease (COPD), Osteoarthritis Osteoarthritis Date Acquired: 08/11/2015 01/30/2016 N/A Weeks of Treatment: 12 10 N/A Wound Status: Open Open N/A Clustered Wound: No Yes N/A Measurements L x W x D 6x4x0.2 6x6x0.2 N/A (cm) Area (cm) : 18.85 28.274 N/A Volume (cm) : 3.77 5.655 N/A % Reduction in Area: -9.10% -144.90% N/A % Reduction in Volume: 27.30% -389.60% N/A Classification: Full Thickness Without Full Thickness Without N/A Exposed Support Exposed Support Structures Structures Exudate Amount: Large Large N/A Exudate Type: Serous Serous N/A Exudate Color: amber amber N/A ANTHEM, FRAZER ([id number]) Foul Odor After Yes Yes N/A Cleansing: Odor Anticipated Due to No No N/A Product Use: Wound Margin: Distinct, outline attached Indistinct, nonvisible N/A Granulation Amount: None Present (0%) None Present (0%) N/A Necrotic Amount: Large (67-100%) Large (67-100%) N/A Exposed Structures: Fascia:  No Fascia: No N/A Fat: No Fat: No Tendon: No Tendon: No Muscle: No Muscle: No Joint: No Joint: No Bone: No Bone: No Limited to Skin Limited to Skin Breakdown Breakdown Epithelialization: Small (1-33%) Small (1-33%) N/A Periwound Skin Texture: Scarring: Yes Edema: No N/A Edema: No Excoriation: No Excoriation: No Induration: No Induration: No Callus: No Callus: No Crepitus: No Crepitus: No Fluctuance: No Fluctuance: No Friable: No Friable: No Rash: No Rash: No Scarring: No Periwound Skin Maceration: Yes Maceration: Yes N/A Moisture: Moist: Yes Moist: Yes Dry/Scaly: No Dry/Scaly: No Periwound Skin Color: Hemosiderin Staining: Yes Hemosiderin Staining: Yes N/A Atrophie Blanche: No Atrophie Blanche: No Cyanosis: No Cyanosis: No Ecchymosis: No Ecchymosis: No Erythema: No Erythema: No Mottled: No Mottled: No Pallor: No Pallor: No Rubor: No Rubor: No Temperature: No Abnormality No Abnormality N/A Tenderness on Yes Yes N/A Palpation: Wound Preparation: Ulcer Cleansing: Ulcer Cleansing: N/A Rinsed/Irrigated with Rinsed/Irrigated with Saline, Other: soap and Saline, Other: soap with water water Topical Anesthetic Topical Anesthetic Applied: Other: lidocaine Applied: Other: lidocaine 4% 4% Treatment Notes STORY, VANVRANKEN ([id number]) Electronic Signature(s) Signed: 04/12/2016 5:06:33 PM By: Elliot Gurney, RN, BSN, Kim RN, BSN Entered By: Elliot Gurney, RN, BSN, Kim on 04/10/2016 08:14:17 [name] ([id number]) -------------------------------------------------------------------------------- Multi-Disciplinary Care Plan Details Patient Name: [name] Date of Service: 04/10/2016 8:00 AM Medical Record Number: [id number] Patient Account Number: [id number] Date of Birth/Sex: 03-13-1946 (70 y.o. Male) Treating RN: Huel Coventry Primary Care Physician: Other Clinician: Referring Physician: SELF, REFERRED Treating Physician/Extender: Rudene Re in Treatment: 12 Active Inactive Venous Leg Ulcer Nursing Diagnoses: Knowledge deficit related to disease process and management Goals: Patient will maintain optimal edema control Date Initiated: 04/03/2016 Goal Status: Active Interventions: Compression as ordered Treatment Activities: Therapeutic compression applied : 04/03/2016 Notes: Wound/Skin Impairment Nursing Diagnoses: Impaired tissue integrity Goals: Ulcer/skin breakdown will heal within 14 weeks Date Initiated: 01/17/2016 Goal Status: Active Interventions: Assess ulceration(s) every visit Notes: Electronic Signature(s) Signed: 04/12/2016 5:06:33 PM By: Elliot Gurney, RN, BSN, Kim RN, BSN Entered By: Elliot Gurney, RN, BSN, Kim on 04/10/2016 08:14:10 [name] ([id number]) [name], [name] ([id number]) -------------------------------------------------------------------------------- Pain Assessment Details Patient Name: [name] Date of Service: 04/10/2016 8:00 AM Medical Record Number: [id number] Patient Account Number: [id number] Date of Birth/Sex: Jan 16, 1946 (70 y.o. Male) Treating RN: Huel Coventry Primary Care Physician: Other Clinician: Referring Physician: SELF, REFERRED Treating Physician/Extender: Rudene Re in Treatment: 12 Active Problems Location of Pain Severity and Description of Pain Patient Has Paino No Site Locations With Dressing Change: No Pain Management and Medication Current Pain Management: Electronic Signature(s) Signed: 04/12/2016 5:06:33 PM By: Elliot Gurney, RN, BSN, Kim  RN, BSN Entered By: Elliot GurneyWoody, RN, BSN, Kim on 04/10/2016 08:01:17 [name] StackBLACKWELL, Zackary C. ([id number]) -------------------------------------------------------------------------------- Patient/Caregiver Education Details Patient Name: [name] StackBLACKWELL, Harlon C. Date of Service: 04/10/2016 8:00 AM Medical Record Number: [id number] Patient Account Number: [id number] Date of Birth/Gender: 03/06/1946 [age] y.o.  Male) Treating RN: Huel CoventryWoody, Kim Primary Care Physician: Other Clinician: Referring Physician: SELF, REFERRED Treating Physician/Extender: Rudene ReBritto, Errol Weeks in Treatment: 12 Education Assessment Education Provided To: Caregiver Education Topics Provided Wound/Skin Impairment: Handouts: Caring for Your Ulcer Methods: Demonstration Responses: State content correctly [employer] CompanyElectronic Signature(s) Signed: 04/12/2016 5:06:33 PM By: Elliot GurneyWoody, RN, BSN, Kim RN, BSN Entered By: Elliot GurneyWoody, RN, BSN, Kim on 04/10/2016 08:44:33 [name] StackBLACKWELL, Bharath C. ([id number]) -------------------------------------------------------------------------------- Wound Assessment Details Patient Name: [name] StackBLACKWELL, Taijuan C. Date of Service: 04/10/2016 8:00 AM Medical Record Number: [id number] Patient Account Number: [id number] Date of Birth/Sex: 03/06/1946 [age] y.o. Male) Treating RN: Huel CoventryWoody, Kim Primary Care Physician: Other Clinician: Referring Physician: SELF, REFERRED Treating Physician/Extender: Rudene ReBritto, Errol Weeks in Treatment: 12 Wound Status Wound Number: 1 Primary Venous Leg Ulcer Etiology: Wound Location: Right Malleolus - Lateral Wound Open Wounding Event: Gradually Appeared Status: Date Acquired: 08/11/2015 Comorbid Cataracts, Chronic Obstructive Weeks Of Treatment: 12 History: Pulmonary Disease (COPD), Clustered Wound: No Osteoarthritis Photos Wound Measurements Length: (cm) 6 Width: (cm) 4 Depth: (cm) 0.2 Area: (cm) 18.85 Volume: (cm) 3.77 % Reduction in Area: -9.1% % Reduction in Volume: 27.3% Epithelialization: Small (1-33%) Tunneling: No Undermining: No Wound Description Full Thickness Without Exposed Foul Odor Afte Classification: Support Structures Due to Product Wound Margin: Distinct, outline attached Exudate Large Amount: Exudate Type: Serous Exudate Color: amber r Cleansing: Yes Use: No Wound Bed Granulation Amount: None Present (0%) Exposed Structure Necrotic Amount: Large  (67-100%) Fascia Exposed: No Necrotic Quality: Adherent Slough Fat Layer Exposed: No Fenster, Jaque C. ([id number]) Tendon Exposed: No Muscle Exposed: No Joint Exposed: No Bone Exposed: No Limited to Skin Breakdown Periwound Skin Texture Texture Color No Abnormalities Noted: No No Abnormalities Noted: No Callus: No Atrophie Blanche: No Crepitus: No Cyanosis: No Excoriation: No Ecchymosis: No Fluctuance: No Erythema: No Friable: No Hemosiderin Staining: Yes Induration: No Mottled: No Localized Edema: No Pallor: No Rash: No Rubor: No Scarring: Yes Temperature / Pain Moisture Temperature: No Abnormality No Abnormalities Noted: No Tenderness on Palpation: Yes Dry / Scaly: No Maceration: Yes Moist: Yes Wound Preparation Ulcer Cleansing: Rinsed/Irrigated with Saline, Other: soap and water, Topical Anesthetic Applied: Other: lidocaine 4%, Treatment Notes Wound #1 (Right, Lateral Malleolus) 1. Cleansed with: Clean wound with Normal Saline 2. Anesthetic Topical Lidocaine 4% cream to wound bed prior to debridement 3. Peri-wound Care: Barrier cream 4. Dressing Applied: Aquacel Ag 5. Secondary Dressing Applied ABD Pad 7. Secured with 4-Layer Compression System - Right Lower Extremity Electronic Signature(s) Signed: 04/12/2016 5:06:33 PM By: Elliot GurneyWoody, RN, BSN, Kim RN, BSN Entered By: Elliot GurneyWoody, RN, BSN, Kim on 04/10/2016 08:12:49 [name] StackBLACKWELL, Demareon C. ([id number]) [name] StackBLACKWELL, Arick C. ([id number]) -------------------------------------------------------------------------------- Wound Assessment Details Patient Name: [name] StackBLACKWELL, Alem C. Date of Service: 04/10/2016 8:00 AM Medical Record Number: [id number] Patient Account Number: [id number] Date of Birth/Sex: 03/06/1946 [age] y.o. Male) Treating RN: Huel CoventryWoody, Kim Primary Care Physician: Other Clinician: Referring Physician: SELF, REFERRED Treating Physician/Extender: Rudene ReBritto, Errol Weeks in Treatment: 12 Wound Status Wound  Number: 2 Primary Arterial Insufficiency Ulcer Etiology: Wound Location: Right Malleolus - Medial Wound Open Wounding Event: Gradually Appeared Status: Date Acquired: 01/30/2016 Comorbid Cataracts, Chronic Obstructive Weeks Of Treatment: 10 History: Pulmonary Disease (COPD), Clustered Wound: Yes Osteoarthritis Photos Wound Measurements Length: (cm) 6 Width: (cm) 6 Depth: (cm)  0.2 Area: (cm) 28.274 Volume: (cm) 5.655 % Reduction in Area: -144.9% % Reduction in Volume: -389.6% Epithelialization: Small (1-33%) Tunneling: No Undermining: No Wound Description Full Thickness Without Exposed Foul Odor Afte Classification: Support Structures Due to Product Wound Margin: Indistinct, nonvisible Exudate Large Amount: Exudate Type: Serous Exudate Color: amber r Cleansing: Yes Use: No Wound Bed Granulation Amount: None Present (0%) Exposed Structure Necrotic Amount: Large (67-100%) Fascia Exposed: No Necrotic Quality: Adherent Slough Fat Layer Exposed: No Belongia, Worthington C. ([id number]) Tendon Exposed: No Muscle Exposed: No Joint Exposed: No Bone Exposed: No Limited to Skin Breakdown Periwound Skin Texture Texture Color No Abnormalities Noted: No No Abnormalities Noted: No Callus: No Atrophie Blanche: No Crepitus: No Cyanosis: No Excoriation: No Ecchymosis: No Fluctuance: No Erythema: No Friable: No Hemosiderin Staining: Yes Induration: No Mottled: No Localized Edema: No Pallor: No Rash: No Rubor: No Scarring: No Temperature / Pain Moisture Temperature: No Abnormality No Abnormalities Noted: No Tenderness on Palpation: Yes Dry / Scaly: No Maceration: Yes Moist: Yes Wound Preparation Ulcer Cleansing: Rinsed/Irrigated with Saline, Other: soap with water, Topical Anesthetic Applied: Other: lidocaine 4%, Treatment Notes Wound #2 (Right, Medial Malleolus) 1. Cleansed with: Clean wound with Normal Saline 2. Anesthetic Topical Lidocaine 4% cream to  wound bed prior to debridement 3. Peri-wound Care: Barrier cream 4. Dressing Applied: Aquacel Ag 5. Secondary Dressing Applied ABD Pad 7. Secured with 4-Layer Compression System - Right Lower Extremity Electronic Signature(s) Signed: 04/12/2016 5:06:33 PM By: Elliot GurneyWoody, RN, BSN, Kim RN, BSN Entered By: Elliot GurneyWoody, RN, BSN, Kim on 04/10/2016 08:13:21 [name] StackBLACKWELL, Din C. ([id number]) [name] StackBLACKWELL, Talin C. ([id number]) -------------------------------------------------------------------------------- Vitals Details Patient Name: [name] StackBLACKWELL, Jacai C. Date of Service: 04/10/2016 8:00 AM Medical Record Number: [id number] Patient Account Number: [id number] Date of Birth/Sex: [birth year] [age] y.o. Male) Treating RN: Huel CoventryWoody, Kim Primary Care Physician: Other Clinician: Referring Physician: SELF, REFERRED Treating Physician/Extender: Rudene ReBritto, Errol Weeks in Treatment: 12 Vital Signs Time Taken: 08:01 Temperature (F): 98.1 Height (in): 76 Pulse (bpm): 83 Weight (lbs): 238 Respiratory Rate (breaths/min): 20 Body Mass Index (BMI): 29 Blood Pressure (mmHg): 145/54 Reference Range: 80 - 120 mg / dl Electronic Signature(s) Signed: 04/12/2016 5:06:33 PM By: Elliot GurneyWoody, RN, BSN, Kim RN, BSN Entered By: Elliot GurneyWoody, RN, BSN, Kim on 04/10/2016 08:01:35

## 2016-04-17 ENCOUNTER — Encounter: Payer: Medicare HMO | Admitting: Internal Medicine

## 2016-04-17 DIAGNOSIS — I87331 Chronic venous hypertension (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: Secondary | ICD-10-CM | POA: Diagnosis not present

## 2016-04-18 NOTE — Progress Notes (Signed)
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 11042 - DEB SUBQ TISSUE 20 SQ CM/< 1 ICD-10 Description Diagnosis I87.331 Chronic venous hypertension (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 CPT4: 2536644036100018 11045 - DEB SUBQ TISS EA ADDL 20CM 5 ICD-10 Description Diagnosis I87.331 Chronic venous hypertension (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 Physician Procedures CPT4: Description Modifier Quantity Code [id number] 11042 - WC PHYS SUBQ TISS 20 SQ CM 1 ICD-10 Description Diagnosis I87.331 Chronic venous hypertension (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 CPT4: 56387566770176 11045 - WC PHYS SUBQ TISS EA ADDL 20 CM 5 [name], Zain C. ([id number]) Electronic Signature(s) Signed: 04/18/2016 7:31:48 AM By: Baltazar Najjarobson, Krina Mraz MD Entered By: Baltazar Najjarobson, Kaspian Muccio on 04/17/2016 08:50:44

## 2016-04-18 NOTE — Progress Notes (Signed)
[name], Joah C. ([id number]) Visit Report for 04/13/2016 Arrival Information Details Patient Name: [name], Caidon C. Date of Service: 04/13/2016 8:00 AM Medical Record Number: [id number] Patient Account Number: [id number] Date of Birth/Sex: March 30, 1946 [age] y.o. Male) Treating RN: Huel CoventryWoody, Kim Primary Care Physician: Other Clinician: Referring Physician: SELF, REFERRED Treating Physician/Extender: Elayne SnarePARKER, PETER Weeks in Treatment: 12 Visit Information History Since Last Visit Added or deleted any medications: No Patient Arrived: Ambulatory Any new allergies or adverse reactions: No Arrival Time: 07:59 Had a fall or experienced change in No Accompanied By: self activities of daily living that may affect Transfer Assistance: None risk of falls: Patient Identification Verified: Yes Signs or symptoms of abuse/neglect since last No Secondary Verification Process Yes visito Completed: Hospitalized since last visit: No Patient Requires Transmission- No Has Dressing in Place as Prescribed: No Based Precautions: Has Compression in Place as Prescribed: No Patient Has Alerts: Yes Pain Present Now: No Patient Alerts: Patient on Blood Thinner Notes PAtient was instructed to take wrap off and shower before his visit Electronic Signature(s) Signed: 04/18/2016 10:00:43 AM By: Elliot GurneyWoody, RN, BSN, Kim RN, BSN Entered By: Elliot GurneyWoody, RN, BSN, Kim on 04/13/2016 08:00:45 [name], Kannan C. ([id number]) -------------------------------------------------------------------------------- Compression Therapy Details Patient Name: [name], Marquize C. Date of Service: 04/13/2016 8:00 AM Medical Record Number: [id number] Patient Account Number: [id number] Date of Birth/Sex: March 30, 1946 [age] y.o. Male) Treating RN: Huel CoventryWoody, Kim Primary Care Physician: Other Clinician: Referring Physician: SELF, REFERRED Treating Physician/Extender: Ardath SaxPARKER, PETER Weeks in Treatment: 12 Compression Therapy Performed for Wound  Wound #2 Right,Medial Malleolus Assessment: Performed By: Cletus Gashlinician Woody, Kim, RN Compression Type: Four Layer Pre Treatment ABI: 1.2 Electronic Signature(s) Signed: 04/18/2016 10:00:43 AM By: Elliot GurneyWoody, RN, BSN, Kim RN, BSN Entered By: Elliot GurneyWoody, RN, BSN, Kim on 04/13/2016 08:22:47 [name], Ashely C. ([id number]) -------------------------------------------------------------------------------- Encounter Discharge Information Details Patient Name: [name], Navdeep C. Date of Service: 04/13/2016 8:00 AM Medical Record Number: [id number] Patient Account Number: [id number] Date of Birth/Sex: March 30, 1946 2(70 y.o. Male) Treating RN: Huel CoventryWoody, Kim Primary Care Physician: Other Clinician: Referring Physician: SELF, REFERRED Treating Physician/Extender: Elayne SnarePARKER, PETER Weeks in Treatment: 12 Encounter Discharge Information Items Discharge Pain Level: 1 Discharge Condition: Stable Ambulatory Status: Ambulatory Discharge Destination: Home Private Transportation: Auto Accompanied By: self Schedule Follow-up Appointment: Yes Medication Reconciliation completed and Yes provided to Patient/Care Verley Pariseau: Clinical Summary of Care: Electronic Signature(s) Signed: 04/18/2016 10:00:43 AM By: Elliot GurneyWoody, RN, BSN, Kim RN, BSN Entered By: Elliot GurneyWoody, RN, BSN, Kim on 04/13/2016 53:66:4408:24:22 [name], Dominyk C. ([id number]) -------------------------------------------------------------------------------- Patient/Caregiver Education Details Patient Name: [name], Cullin C. Date of Service: 04/13/2016 8:00 AM Medical Record Number: [id number] Patient Account Number: [id number] Date of Birth/Gender: March 30, 1946 [age] y.o. Male) Treating RN: Huel CoventryWoody, Kim Primary Care Physician: Other Clinician: Referring Physician: SELF, REFERRED Treating Physician/Extender: Elayne SnarePARKER, PETER Weeks in Treatment: 12 Education Assessment Education Provided To: Patient Education Topics Provided Venous: Handouts: Controlling Swelling with  Multilayered Compression Wraps Methods: Demonstration, Explain/Verbal Responses: State content correctly Electronic Signature(s) Signed: 04/18/2016 10:00:43 AM By: Elliot GurneyWoody, RN, BSN, Kim RN, BSN Entered By: Elliot GurneyWoody, RN, BSN, Kim on 04/13/2016 08:24:05 [name], Blayke C. ([id number]) -------------------------------------------------------------------------------- Wound Assessment Details Patient Name: [name], Artrell C. Date of Service: 04/13/2016 8:00 AM Medical Record Number: [id number] Patient Account Number: [id number] Date of Birth/Sex: March 30, 1946 [age] y.o. Male) Treating RN: Huel CoventryWoody, Kim Primary Care Physician: Other Clinician: Referring Physician: SELF, REFERRED Treating Physician/Extender: Ardath SaxPARKER, PETER Weeks in Treatment: 12 Wound Status Wound Number: 1 Primary Etiology: Venous Leg Ulcer Wound Location: Right, Lateral Malleolus Wound Status: Open Wounding Event: Gradually Appeared Date Acquired:  08/11/2015 Weeks Of Treatment: 12 Clustered Wound: No Wound Measurements Length: (cm) 5 Width: (cm) 4 Depth: (cm) 0.2 Area: (cm) 15.708 Volume: (cm) 3.142 % Reduction in Area: 9.1% % Reduction in Volume: 39.4% Wound Description Full Thickness Without Exposed Classification: Support Structures Periwound Skin Texture Texture Color No Abnormalities Noted: No No Abnormalities Noted: No Moisture No Abnormalities Noted: No Electronic Signature(s) Signed: 04/18/2016 10:00:43 AM By: Elliot GurneyWoody, RN, BSN, Kim RN, BSN Entered By: Elliot GurneyWoody, RN, BSN, Kim on 04/13/2016 08:22:14 [name], Eldo C. ([id number]) -------------------------------------------------------------------------------- Wound Assessment Details Patient Name: [name], Jumar C. Date of Service: 04/13/2016 8:00 AM Medical Record Number: [id number] Patient Account Number: [id number] Date of Birth/Sex: 01-Sep-1945 [age] y.o. Male) Treating RN: Huel CoventryWoody, Kim Primary Care Physician: Other Clinician: Referring Physician: SELF,  REFERRED Treating Physician/Extender: Ardath SaxPARKER, PETER Weeks in Treatment: 12 Wound Status Wound Number: 2 Primary Etiology: Arterial Insufficiency Ulcer Wound Location: Right, Medial Malleolus Wound Status: Open Wounding Event: Gradually Appeared Date Acquired: 01/30/2016 Weeks Of Treatment: 10 Clustered Wound: Yes Wound Measurements Length: (cm) 6 Width: (cm) 6 Depth: (cm) 0.2 Area: (cm) 28.274 Volume: (cm) 5.655 % Reduction in Area: -144.9% % Reduction in Volume: -389.6% Wound Description Full Thickness Without Exposed Classification: Support Structures Periwound Skin Texture Texture Color No Abnormalities Noted: No No Abnormalities Noted: No Moisture No Abnormalities Noted: No Electronic Signature(s) Signed: 04/18/2016 10:00:43 AM By: Elliot GurneyWoody, RN, BSN, Kim RN, BSN Entered By: Elliot GurneyWoody, RN, BSN, Kim on 04/13/2016 08:22:14

## 2016-04-18 NOTE — Progress Notes (Signed)
[name] ([id number]) Visit Report for 04/17/2016 Arrival Information Details [name] Date of Service: 04/17/2016 8:00 AM Patient Name: C. Patient Account Number: [id number] Medical Record Treating RN: [name] [id number] Number: Other Clinician: Date of Birth/Sex: 02-09-1946 (70 y.o. Male) Treating [name], [name] Primary Care Physician: Physician/Extender: G Referring Physician: SELF, REFERRED Weeks in Treatment: 13 Visit Information History Since Last Visit All ordered tests and consults were completed: No Patient Arrived: Ambulatory Added or deleted any medications: No Arrival Time: 07:59 Any new allergies or adverse reactions: No Accompanied By: self Had a fall or experienced change in No Transfer Assistance: None activities of daily living that may affect Patient Identification Verified: Yes risk of falls: Secondary Verification Process Yes Signs or symptoms of abuse/neglect since last No Completed: visito Patient Requires Transmission- No Hospitalized since last visit: No Based Precautions: Pain Present Now: No Patient Has Alerts: Yes Patient Alerts: Patient on Blood Thinner Electronic Signature(s) Signed: 04/17/2016 3:40:23 PM By: Alejandro Mulling Entered By: Alejandro Mulling on 04/17/2016 08:00:02 [name] ([id number]) -------------------------------------------------------------------------------- Encounter Discharge Information Details [name] Date of Service: 04/17/2016 8:00 AM Patient Name: C. Patient Account Number: [id number] Medical Record Treating RN: [name] [id number] Number: Other Clinician: Date of Birth/Sex: 09-Jun-1946 (70 y.o. Male) Treating [name], [name] Primary Care Physician: Physician/Extender: G Referring Physician: SELF, REFERRED Weeks in Treatment: 13 Encounter Discharge Information Items Discharge Pain Level: 0 Discharge Condition: Stable Ambulatory Status: Ambulatory Discharge  Destination: Home Transportation: Private Auto Accompanied By: self Schedule Follow-up Appointment: Yes Medication Reconciliation completed and provided to Patient/Care Yes [name]: Provided on Clinical Summary of Care: 04/17/2016 Form Type Recipient Paper Patient LB Electronic Signature(s) Signed: 04/17/2016 8:43:46 AM By: Gwenlyn Perking Entered By: Gwenlyn Perking on 04/17/2016 08:43:46 [name] ([id number]) -------------------------------------------------------------------------------- Lower Extremity Assessment Details [name] Date of Service: 04/17/2016 8:00 AM Patient Name: C. Patient Account Number: [id number] Medical Record Treating RN: [name] [id number] Number: Other Clinician: Date of Birth/Sex: 09-05-1945 (70 y.o. Male) Treating [name], [name] Primary Care Physician: Physician/Extender: G Referring Physician: SELF, REFERRED Weeks in Treatment: 13 Edema Assessment Assessed: [Left: No] [Right: No] E[Left: dema] [Right: :] Calf Left: Right: Point of Measurement: 40 cm From Medial Instep cm 36.5 cm Ankle Left: Right: Point of Measurement: 12 cm From Medial Instep cm 25 cm Vascular Assessment Pulses: Posterior Tibial Dorsalis Pedis Palpable: [Right:Yes] Extremity colors, hair growth, and conditions: Extremity Color: [Right:Hyperpigmented] Temperature of Extremity: [Right:Warm] Capillary Refill: [Right:< 3 seconds] Toe Nail Assessment Left: Right: Thick: Yes Discolored: Yes Deformed: No Improper Length and Hygiene: No Electronic Signature(s) Signed: 04/17/2016 3:40:23 PM By: Alejandro Mulling Entered By: Alejandro Mulling on 04/17/2016 08:07:06 [name] ([id number]) [name], [name] ([id number]) -------------------------------------------------------------------------------- Multi Wound Chart Details [name] Date of Service: 04/17/2016 8:00 AM Patient Name: C. Patient Account Number: [id number] Medical  Record Treating RN: [name] [id number] Number: Other Clinician: Date of Birth/Sex: [birth year] (70 y.o. Male) Treating [name], [name] Primary Care Physician: Physician/Extender: G Referring Physician: SELF, REFERRED Weeks in Treatment: 13 Vital Signs Height(in): 76 Pulse(bpm): 76 Weight(lbs): 238 Blood Pressure 153/65 (mmHg): Body Mass Index(BMI): 29 Temperature(F): 97.9 Respiratory Rate 20 (breaths/min): Photos: [1:No Photos] [2:No Photos] [N/A:N/A] Wound Location: [1:Right Malleolus - Lateral] [2:Right Malleolus - Medial N/A] Wounding Event: [1:Gradually Appeared] [2:Gradually Appeared] [N/A:N/A] Primary Etiology: [1:Venous Leg Ulcer] [2:Arterial Insufficiency Ulcer N/A] Comorbid History: [1:Cataracts, Chronic Obstructive Pulmonary Disease (COPD), Osteoarthritis] [2:Cataracts, Chronic Obstructive Pulmonary Disease (COPD), Osteoarthritis] [N/A:N/A] Date Acquired: [1:08/11/2015] [2:01/30/2016] [N/A:N/A] Weeks of Treatment: [1:13] [2:11] [N/A:N/A] Wound Status: [  1:Open] [2:Open] [N/A:N/A] Clustered Wound: [1:No] [2:Yes] [N/A:N/A] Measurements L x W x D 9x8.5x0.2 [2:7x3.7x0.2] [N/A:N/A] (cm) Area (cm) : [1:60.083] [2:20.342] [N/A:N/A] Volume (cm) : [1:12.017] [2:4.068] [N/A:N/A] % Reduction in Area: [1:-247.70%] [2:-76.20%] [N/A:N/A] % Reduction in Volume: -131.80% [2:-252.20%] [N/A:N/A] Classification: [1:Full Thickness Without Exposed Support Structures] [2:Full Thickness Without Exposed Support Structures] [N/A:N/A] Exudate Amount: [1:Large] [2:Large] [N/A:N/A] Exudate Type: [1:Serosanguineous] [2:Serosanguineous] [N/A:N/A] Exudate Color: [1:red, brown] [2:red, brown] [N/A:N/A] Wound Margin: [1:Thickened] [2:N/A] [N/A:N/A] Granulation Amount: [1:None Present (0%)] [2:None Present (0%)] [N/A:N/A] Necrotic Amount: [1:Large (67-100%)] [2:Large (67-100%)] [N/A:N/A] Epithelialization: [1:N/A] [2:None] [N/A:N/A] Periwound Skin Texture: Edema: Yes Edema: Yes  N/A Periwound Skin Moist: Yes Moist: Yes N/A Moisture: Periwound Skin Color: No Abnormalities Noted No Abnormalities Noted N/A Temperature: No Abnormality No Abnormality N/A Tenderness on Yes Yes N/A Palpation: Wound Preparation: Ulcer Cleansing: Other: Ulcer Cleansing: Other: N/A soap and water soap and water Topical Anesthetic Topical Anesthetic Applied: Other: lidocaine 4 Applied: Other: lidocaine % 4% Treatment Notes Electronic Signature(s) Signed: 04/17/2016 3:40:23 PM By: Alejandro MullingPinkerton, Debra Entered By: Alejandro MullingPinkerton, Debra on 04/17/2016 08:11:22 [name] StackBLACKWELL, [name] ([id number]) -------------------------------------------------------------------------------- Multi-Disciplinary Care Plan Details [name] RingerBLACKWELL, [name] Date of Service: 04/17/2016 8:00 AM Patient Name: C. Patient Account Number: [id number] Medical Record Treating RN: Phillis Haggisinkerton, [name] [id number] Number: Other Clinician: Date of Birth/Sex: August 31, 1945 [age] y.o. Male) Treating [name], [name] Primary Care Physician: Physician/Extender: G Referring Physician: SELF, REFERRED Weeks in Treatment: 13 Active Inactive Venous Leg Ulcer Nursing Diagnoses: Knowledge deficit related to disease process and management Goals: Patient will maintain optimal edema control Date Initiated: 04/03/2016 Goal Status: Active Interventions: Compression as ordered Treatment Activities: Therapeutic compression applied : 04/03/2016 Notes: Wound/Skin Impairment Nursing Diagnoses: Impaired tissue integrity Goals: Ulcer/skin breakdown will heal within 14 weeks Date Initiated: 01/17/2016 Goal Status: Active Interventions: Assess ulceration(s) every visit Notes: Electronic Signature(s) Signed: 04/17/2016 3:40:23 PM By: Aurelio JewPinkerton, Debra Keena, [name] ([id number]) Entered By: Alejandro MullingPinkerton, Debra on 04/17/2016 08:11:17 [name] StackBLACKWELL, [name] ([id number]) -------------------------------------------------------------------------------- Pain Assessment  Details [name] RingerBLACKWELL, [name] Date of Service: 04/17/2016 8:00 AM Patient Name: C. Patient Account Number: [id number] Medical Record Treating RN: Phillis Haggisinkerton, [name] [id number] Number: Other Clinician: Date of Birth/Sex: August 31, 1945 [age] y.o. Male) Treating [name], [name] Primary Care Physician: Physician/Extender: G Referring Physician: SELF, REFERRED Weeks in Treatment: 13 Active Problems Location of Pain Severity and Description of Pain Patient Has Paino No Site Locations With Dressing Change: No Pain Management and Medication Current Pain Management: Electronic Signature(s) Signed: 04/17/2016 3:40:23 PM By: Alejandro MullingPinkerton, Debra Entered By: Alejandro MullingPinkerton, Debra on 04/17/2016 08:00:33 [name] StackBLACKWELL, [name] ([id number]) -------------------------------------------------------------------------------- Patient/Caregiver Education Details [name] RingerBLACKWELL, [name] Date of Service: 04/17/2016 8:00 AM Patient Name: C. Patient Account Number: [id number] Medical Record Treating RN: Phillis Haggisinkerton, [name] [id number] Number: Other Clinician: Date of Birth/Gender: August 31, 1945 [age] y.o. Male) Treating [name], [name] Primary Care Physician: Physician/Extender: G Referring Physician: SELF, REFERRED Weeks in Treatment: 13 Education Assessment Education Provided To: Patient Education Topics Provided Wound/Skin Impairment: Handouts: Other: do not get wrap wet Methods: Demonstration, Explain/Verbal Responses: State content correctly Electronic Signature(s) Signed: 04/17/2016 3:40:23 PM By: Alejandro MullingPinkerton, Debra Entered By: Alejandro MullingPinkerton, Debra on 04/17/2016 08:43:22 [name] StackBLACKWELL, Jaidin C. ([id number]) -------------------------------------------------------------------------------- Wound Assessment Details [name] RingerBLACKWELL, [name] Date of Service: 04/17/2016 8:00 AM Patient Name: C. Patient Account Number: [id number] Medical Record Treating RN: Phillis Haggisinkerton, [name] [id number] Number: Other Clinician: Date of Birth/Sex: August 31, 1945 [age] y.o. Male)  Treating [name], [name] Primary Care Physician: Physician/Extender: G Referring Physician: SELF, REFERRED Weeks in Treatment: 13 Wound Status Wound Number: 1 Primary Venous Leg Ulcer Etiology: Wound Location: Right Malleolus - Lateral Wound  Open Wounding Event: Gradually Appeared Status: Date Acquired: 08/11/2015 Comorbid Cataracts, Chronic Obstructive Weeks Of Treatment: 13 History: Pulmonary Disease (COPD), Clustered Wound: No Osteoarthritis Photos Photo Uploaded By: Alejandro MullingPinkerton, Debra on 04/17/2016 12:02:12 Wound Measurements Length: (cm) 9 Width: (cm) 8.5 Depth: (cm) 0.2 Area: (cm) 60.083 Volume: (cm) 12.017 % Reduction in Area: -247.7% % Reduction in Volume: -131.8% Tunneling: No Undermining: No Wound Description Full Thickness Without Exposed Classification: Support Structures Wound Margin: Thickened Exudate Large Amount: Exudate Type: Serosanguineous Exudate Color: red, brown Foul Odor After Cleansing: No Wound Bed [name], [name] ([id number]) Granulation Amount: None Present (0%) Necrotic Amount: Large (67-100%) Necrotic Quality: Adherent Slough Periwound Skin Texture Texture Color No Abnormalities Noted: No No Abnormalities Noted: No Localized Edema: Yes Temperature / Pain Moisture Temperature: No Abnormality No Abnormalities Noted: No Tenderness on Palpation: Yes Moist: Yes Wound Preparation Ulcer Cleansing: Other: soap and water, Topical Anesthetic Applied: Other: lidocaine 4 %, Treatment Notes Wound #1 (Right, Lateral Malleolus) 1. Cleansed with: Cleanse wound with antibacterial soap and water 2. Anesthetic Topical Lidocaine 4% cream to wound bed prior to debridement 3. Peri-wound Care: Barrier cream Moisturizing lotion 4. Dressing Applied: Santyl Ointment Medihoney Gel 5. Secondary Dressing Applied ABD Pad Dry Gauze 7. Secured with Tape 4-Layer Compression System - Right Lower Extremity Electronic Signature(s) Signed:  04/17/2016 3:40:23 PM By: Alejandro MullingPinkerton, Debra Entered By: Alejandro MullingPinkerton, Debra on 04/17/2016 08:09:01 [name] StackBLACKWELL, Massey C. ([id number]) -------------------------------------------------------------------------------- Wound Assessment Details [name] RingerBLACKWELL, [name] Date of Service: 04/17/2016 8:00 AM Patient Name: C. Patient Account Number: [id number] Medical Record Treating RN: Phillis Haggisinkerton, [name] [id number] Number: Other Clinician: Date of Birth/Sex: Dec 30, 1945 [age] y.o. Male) Treating [name], [name] Primary Care Physician: Physician/Extender: G Referring Physician: SELF, REFERRED Weeks in Treatment: 13 Wound Status Wound Number: 2 Primary Arterial Insufficiency Ulcer Etiology: Wound Location: Right Malleolus - Medial Wound Open Wounding Event: Gradually Appeared Status: Date Acquired: 01/30/2016 Comorbid Cataracts, Chronic Obstructive Weeks Of Treatment: 11 History: Pulmonary Disease (COPD), Clustered Wound: Yes Osteoarthritis Photos Photo Uploaded By: Alejandro MullingPinkerton, Debra on 04/17/2016 12:02:13 Wound Measurements Length: (cm) 7 Width: (cm) 3.7 Depth: (cm) 0.2 Area: (cm) 20.342 Volume: (cm) 4.068 % Reduction in Area: -76.2% % Reduction in Volume: -252.2% Epithelialization: None Tunneling: No Undermining: No Wound Description Full Thickness Without Exposed Classification: Support Structures Exudate Large Amount: Exudate Type: Serosanguineous Exudate Color: red, brown Foul Odor After Cleansing: No Wound Bed Granulation Amount: None Present (0%) [name], [name] ([id number]) Necrotic Amount: Large (67-100%) Necrotic Quality: Adherent Slough Periwound Skin Texture Texture Color No Abnormalities Noted: No No Abnormalities Noted: No Localized Edema: Yes Temperature / Pain Moisture Temperature: No Abnormality No Abnormalities Noted: No Tenderness on Palpation: Yes Moist: Yes Wound Preparation Ulcer Cleansing: Other: soap and water, Topical Anesthetic Applied: Other:  lidocaine 4%, Treatment Notes Wound #2 (Right, Medial Malleolus) 1. Cleansed with: Cleanse wound with antibacterial soap and water 2. Anesthetic Topical Lidocaine 4% cream to wound bed prior to debridement 3. Peri-wound Care: Barrier cream Moisturizing lotion 4. Dressing Applied: Santyl Ointment Medihoney Gel 5. Secondary Dressing Applied ABD Pad Dry Gauze 7. Secured with Tape 4-Layer Compression System - Right Lower Extremity Electronic Signature(s) Signed: 04/17/2016 3:40:23 PM By: Alejandro MullingPinkerton, Debra Entered By: Alejandro MullingPinkerton, Debra on 04/17/2016 08:09:57 [name] StackBLACKWELL, Ryot C. ([id number]) -------------------------------------------------------------------------------- Vitals Details [name] RingerBLACKWELL, Loye Date of Service: 04/17/2016 8:00 AM Patient Name: C. Patient Account Number: [id number] Medical Record Treating RN: Phillis Haggisinkerton, [name] [id number] Number: Other Clinician: Date of Birth/Sex: Dec 30, 1945 [age] y.o. Male) Treating Baltazar NajjarOBSON, [name] Primary Care Physician: Physician/Extender: G Referring Physician: SELF, REFERRED Weeks in  Treatment: 13 Vital Signs Time Taken: 08:01 Temperature (F): 97.9 Height (in): 76 Pulse (bpm): 76 Weight (lbs): 238 Respiratory Rate (breaths/min): 20 Body Mass Index (BMI): 29 Blood Pressure (mmHg): 153/65 Reference Range: 80 - 120 mg / dl Electronic Signature(s) Signed: 04/17/2016 3:40:23 PM By: Alejandro Mulling Entered By: Alejandro Mulling on 04/17/2016 08:01:58

## 2016-04-20 ENCOUNTER — Encounter: Payer: Medicare HMO | Admitting: Surgery

## 2016-04-20 DIAGNOSIS — I87331 Chronic venous hypertension (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: Secondary | ICD-10-CM | POA: Diagnosis not present

## 2016-04-21 NOTE — Progress Notes (Signed)
CID, AGENA ([id number]) Visit Report for 04/20/2016 Arrival Information Details Patient Name: [name], [name]. Date of Service: 04/20/2016 8:00 AM Medical Record Number: [id number] Patient Account Number: [id number] Date of Birth/Sex: 1946/07/26 (70 y.o. Male) Treating RN: [name], RN, BSN, [place] Sink Primary Care Physician: Other Clinician: Referring Physician: SELF, REFERRED Treating Physician/Extender: Rudene Re in Treatment: 13 Visit Information History Since Last Visit All ordered tests and consults were completed: No Patient Arrived: Ambulatory Added or deleted any medications: No Arrival Time: 08:11 Any new allergies or adverse reactions: No Accompanied By: self Had a fall or experienced change in No Transfer Assistance: None activities of daily living that may affect Patient Identification Verified: Yes risk of falls: Secondary Verification Process Yes Signs or symptoms of abuse/neglect since last No Completed: visito Patient Requires Transmission- No Hospitalized since last visit: No Based Precautions: Has Dressing in Place as Prescribed: Yes Patient Has Alerts: Yes Pain Present Now: No Patient Alerts: Patient on Blood Thinner Electronic Signature(s) Signed: 04/20/2016 3:54:21 PM By: [name] BSN, RN Entered By: [name] on 04/20/2016 08:11:35 [name] ([id number]) -------------------------------------------------------------------------------- Encounter Discharge Information Details Patient Name: [name] Date of Service: 04/20/2016 8:00 AM Medical Record Number: [id number] Patient Account Number: [id number] Date of Birth/Sex: June 01, 1946 (70 y.o. Male) Treating RN: [name], RN, BSN, [place] Sink Primary Care Physician: Other Clinician: Referring Physician: SELF, REFERRED Treating Physician/Extender: Rudene Re in Treatment: 66 Encounter Discharge Information Items Discharge Pain Level: 0 Discharge Condition:  Stable Ambulatory Status: Ambulatory Discharge Destination: Home Private Transportation: Auto Accompanied By: self Schedule Follow-up Appointment: No Medication Reconciliation completed and No provided to Patient/Care Colletta Spillers: Clinical Summary of Care: Electronic Signature(s) Signed: 04/20/2016 3:54:21 PM By: [name] BSN, RN Entered By: [name] on 04/20/2016 08:57:18 [name] ([id number]) -------------------------------------------------------------------------------- Lower Extremity Assessment Details Patient Name: [name] Date of Service: 04/20/2016 8:00 AM Medical Record Number: [id number] Patient Account Number: [id number] Date of Birth/Sex: [birth year] (70 y.o. Male) Treating RN: [name], RN, BSN, [place] Primary Care Physician: Other Clinician: Referring Physician: SELF, REFERRED Treating Physician/Extender: Rudene Re in Treatment: 13 Edema Assessment Assessed: [Left: No] [Right: No] Edema: [Left: N] [Right: o] Calf Left: Right: Point of Measurement: 40 cm From Medial Instep cm 36.2 cm Ankle Left: Right: Point of Measurement: 12 cm From Medial Instep cm 24.5 cm Vascular Assessment Claudication: Claudication Assessment [Right:None] Pulses: Posterior Tibial Dorsalis Pedis Palpable: [Right:Yes] Extremity colors, hair growth, and conditions: Extremity Color: [Right:Mottled] Hair Growth on Extremity: [Right:Yes] Temperature of Extremity: [Right:Warm] Capillary Refill: [Right:< 3 seconds] Toe Nail Assessment Left: Right: Thick: Yes Discolored: Yes Deformed: No Improper Length and Hygiene: No Electronic Signature(s) Signed: 04/20/2016 3:54:21 PM By: [name] BSN, RN Entered By: [name] on 04/20/2016 08:54:47 [name] ([name], [name] ([id number]) -------------------------------------------------------------------------------- Multi Wound Chart Details Patient Name: [name] Date  of Service: 04/20/2016 8:00 AM Medical Record Number: [id number] Patient Account Number: [id number] Date of Birth/Sex: September 13, 1945 (70 y.o. Male) Treating RN: [name], RN, BSN, [place] Sink Primary Care Physician: Other Clinician: Referring Physician: SELF, REFERRED Treating Physician/Extender: Rudene Re in Treatment: 13 Vital Signs Height(in): 76 Pulse(bpm): 76 Weight(lbs): 238 Blood Pressure 148/70 (mmHg): Body Mass Index(BMI): 29 Temperature(F): 98.1 Respiratory Rate 17 (breaths/min): Photos: [1:No Photos] [2:No Photos] [N/A:N/A] Wound Location: [1:Right Malleolus - Lateral] [2:Right Malleolus - Medial N/A] Wounding Event: [1:Gradually Appeared] [2:Gradually Appeared] [N/A:N/A] Primary Etiology: [1:Venous Leg Ulcer] [2:Arterial Insufficiency Ulcer N/A] Comorbid History: [1:Cataracts, Chronic Obstructive Pulmonary Disease (COPD), Osteoarthritis] [2:Cataracts, Chronic Obstructive  Pulmonary Disease (COPD), Osteoarthritis] [N/A:N/A] Date Acquired: [1:08/11/2015] [2:01/30/2016] [N/A:N/A] Weeks of Treatment: [1:13] [2:11] [N/A:N/A] Wound Status: [1:Open] [2:Open] [N/A:N/A] Clustered Wound: [1:No] [2:Yes] [N/A:N/A] Measurements L x W x D 10.3x5.2x0.2 [2:7.6x4x0.2] [N/A:N/A] (cm) Area (cm) : [1:42.066] [2:23.876] [N/A:N/A] Volume (cm) : [1:8.413] [2:4.775] [N/A:N/A] % Reduction in Area: [1:-143.50%] [2:-106.80%] [N/A:N/A] % Reduction in Volume: -62.30% [2:-313.40%] [N/A:N/A] Classification: [1:Full Thickness Without Exposed Support Structures] [2:Full Thickness Without Exposed Support Structures] [N/A:N/A] Exudate Amount: [1:Large] [2:Small] [N/A:N/A] Exudate Type: [1:Serosanguineous] [2:Serosanguineous] [N/A:N/A] Exudate Color: [1:red, brown] [2:red, brown] [N/A:N/A] Wound Margin: [1:Thickened] [2:Distinct, outline attached] [N/A:N/A] Granulation Amount: [1:Small (1-33%)] [2:Small (1-33%)] [N/A:N/A] Granulation Quality: [1:Pink] [2:Pink] [N/A:N/A] Necrotic Amount: [1:Large  (67-100%)] [2:Large (67-100%)] [N/A:N/A] Exposed Structures: [1:Fascia: No Fat: No] [2:Fascia: No Fat: No] [N/A:N/A] Tendon: No Tendon: No Muscle: No Muscle: No Joint: No Joint: No Bone: No Bone: No Limited to Skin Limited to Skin Breakdown Breakdown Epithelialization: None None N/A Periwound Skin Texture: Scarring: Yes Scarring: Yes N/A Edema: No Edema: No Excoriation: No Excoriation: No Induration: No Induration: No Callus: No Callus: No Crepitus: No Crepitus: No Fluctuance: No Fluctuance: No Friable: No Friable: No Rash: No Rash: No Periwound Skin Moist: Yes Moist: Yes N/A Moisture: Maceration: No Maceration: No Dry/Scaly: No Dry/Scaly: No Periwound Skin Color: Atrophie Blanche: No Atrophie Blanche: No N/A Cyanosis: No Cyanosis: No Ecchymosis: No Ecchymosis: No Erythema: No Erythema: No Hemosiderin Staining: No Hemosiderin Staining: No Mottled: No Mottled: No Pallor: No Pallor: No Rubor: No Rubor: No Temperature: No Abnormality No Abnormality N/A Tenderness on Yes Yes N/A Palpation: Wound Preparation: Ulcer Cleansing: Other: Ulcer Cleansing: Other: N/A soap and water soap and water Topical Anesthetic Topical Anesthetic Applied: Other: lidocaine 4 Applied: Other: lidocaine % 4% Treatment Notes Electronic Signature(s) Signed: 04/20/2016 3:54:21 PM By: [name] BSN, RN Entered By: [name] on 04/20/2016 08:56:04 [name] ([id number]) -------------------------------------------------------------------------------- Multi-Disciplinary Care Plan Details Patient Name: [name] Date of Service: 04/20/2016 8:00 AM Medical Record Number: [id number] Patient Account Number: [id number] Date of Birth/Sex: March 02, 1946 (70 y.o. Male) Treating RN: [name], RN, BSN, [place] Sink Primary Care Physician: Other Clinician: Referring Physician: SELF, REFERRED Treating Physician/Extender: Rudene Re in Treatment: 13 Active  Inactive Venous Leg Ulcer Nursing Diagnoses: Knowledge deficit related to disease process and management Goals: Patient will maintain optimal edema control Date Initiated: 04/03/2016 Goal Status: Active Interventions: Compression as ordered Treatment Activities: Therapeutic compression applied : 04/03/2016 Notes: Wound/Skin Impairment Nursing Diagnoses: Impaired tissue integrity Goals: Ulcer/skin breakdown will heal within 14 weeks Date Initiated: 01/17/2016 Goal Status: Active Interventions: Assess ulceration(s) every visit Notes: Electronic Signature(s) Signed: 04/20/2016 3:54:21 PM By: [name] BSN, RN Entered By: [name] on 04/20/2016 08:51:03 [name] ([name], [name] ([id number]) -------------------------------------------------------------------------------- Pain Assessment Details Patient Name: [name] Date of Service: 04/20/2016 8:00 AM Medical Record Number: [id number] Patient Account Number: [id number] Date of Birth/Sex: 16-Aug-1946 (70 y.o. Male) Treating RN: [name], RN, BSN, [place] Sink Primary Care Physician: Other Clinician: Referring Physician: SELF, REFERRED Treating Physician/Extender: Rudene Re in Treatment: 13 Active Problems Location of Pain Severity and Description of Pain Patient Has Paino No Site Locations With Dressing Change: No Pain Management and Medication Current Pain Management: Electronic Signature(s) Signed: 04/20/2016 3:54:21 PM By: [name] BSN, RN Entered By: [name] on 04/20/2016 08:53:50 [name] ([id number]) -------------------------------------------------------------------------------- Patient/Caregiver Education Details Patient Name: [name] Date of Service: 04/20/2016 8:00 AM Medical Record Number: [id number] Patient Account Number: [id number] Date of Birth/Gender: [birth year] (70 y.o. Male) Treating  RN: Clover MealyAfful, RN, BSN, [employer] Primary Care  Physician: Other Clinician: Referring Physician: SELF, REFERRED Treating Physician/Extender: Rudene ReBritto, Errol Weeks in Treatment: 13 Education Assessment Education Provided To: Patient Education Topics Provided Wound/Skin Impairment: Methods: Explain/Verbal Responses: State content correctly [employer] CompanyElectronic Signature(s) Signed: 04/20/2016 3:54:21 PM By: Elpidio EricAfful, Rita BSN, RN Entered By: Elpidio EricAfful, Rita on 04/20/2016 08:57:27 [name] StackBLACKWELL, [name] ([id number]) -------------------------------------------------------------------------------- Wound Assessment Details Patient Name: [name] StackBLACKWELL, [name] Date of Service: 04/20/2016 8:00 AM Medical Record Number: [id number] Patient Account Number: [id number] Date of Birth/Sex: 1946-02-29 [age] y.o. Male) Treating RN: Clover MealyAfful, RN, BSN, Psychologist, clinicalita Primary Care Physician: Other Clinician: Referring Physician: SELF, REFERRED Treating Physician/Extender: Rudene ReBritto, Errol Weeks in Treatment: 13 Wound Status Wound Number: 1 Primary Venous Leg Ulcer Etiology: Wound Location: Right Malleolus - Lateral Wound Open Wounding Event: Gradually Appeared Status: Date Acquired: 08/11/2015 Comorbid Cataracts, Chronic Obstructive Weeks Of Treatment: 13 History: Pulmonary Disease (COPD), Clustered Wound: No Osteoarthritis Photos Photo Uploaded By: Elpidio EricAfful, Rita on 04/20/2016 13:26:21 Wound Measurements Length: (cm) 10.3 Width: (cm) 5.2 Depth: (cm) 0.2 Area: (cm) 42.066 Volume: (cm) 8.413 % Reduction in Area: -143.5% % Reduction in Volume: -62.3% Epithelialization: None Tunneling: No Undermining: No Wound Description Full Thickness Without Exposed Classification: Support Structures Wound Margin: Thickened Large [name] StackBLACKWELL, [name] ([id number]) Foul Odor After Cleansing: No Exudate Amount: Exudate Type: Serosanguineous Exudate Color: red, brown Wound Bed Granulation Amount: Small (1-33%) Exposed Structure Granulation Quality: Pink Fascia Exposed: No Necrotic  Amount: Large (67-100%) Fat Layer Exposed: No Necrotic Quality: Adherent Slough Tendon Exposed: No Muscle Exposed: No Joint Exposed: No Bone Exposed: No Limited to Skin Breakdown Periwound Skin Texture Texture Color No Abnormalities Noted: No No Abnormalities Noted: No Callus: No Atrophie Blanche: No Crepitus: No Cyanosis: No Excoriation: No Ecchymosis: No Fluctuance: No Erythema: No Friable: No Hemosiderin Staining: No Induration: No Mottled: No Localized Edema: No Pallor: No Rash: No Rubor: No Scarring: Yes Temperature / Pain Moisture Temperature: No Abnormality No Abnormalities Noted: No Tenderness on Palpation: Yes Dry / Scaly: No Maceration: No Moist: Yes Wound Preparation Ulcer Cleansing: Other: soap and water, Topical Anesthetic Applied: Other: lidocaine 4 %, Treatment Notes Wound #1 (Right, Lateral Malleolus) 1. Cleansed with: Cleanse wound with antibacterial soap and water 3. Peri-wound Care: Barrier cream 4. Dressing Applied: Santyl Ointment 5. Secondary Dressing Applied [name], [name] ([id number]) ABD Pad Dry Gauze 7. Secured with 4-Layer Compression System - Right Lower Extremity Notes drawtex Electronic Signature(s) Signed: 04/20/2016 3:54:21 PM By: Elpidio EricAfful, Rita BSN, RN Entered By: Elpidio EricAfful, Rita on 04/20/2016 08:55:28 [name] StackBLACKWELL, [name] ([id number]) -------------------------------------------------------------------------------- Wound Assessment Details Patient Name: [name] StackBLACKWELL, [name] Date of Service: 04/20/2016 8:00 AM Medical Record Number: [id number] Patient Account Number: [id number] Date of Birth/Sex: 1946-02-29 [age] y.o. Male) Treating RN: Clover MealyAfful, RN, BSN, Psychologist, clinicalita Primary Care Physician: Other Clinician: Referring Physician: SELF, REFERRED Treating Physician/Extender: Rudene ReBritto, Errol Weeks in Treatment: 13 Wound Status Wound Number: 2 Primary Arterial Insufficiency Ulcer Etiology: Wound Location: Right Malleolus - Medial Wound  Open Wounding Event: Gradually Appeared Status: Date Acquired: 01/30/2016 Comorbid Cataracts, Chronic Obstructive Weeks Of Treatment: 11 History: Pulmonary Disease (COPD), Clustered Wound: Yes Osteoarthritis Photos Photo Uploaded By: Elpidio EricAfful, Rita on 04/20/2016 13:26:21 Wound Measurements Length: (cm) 7.6 Width: (cm) 4 Depth: (cm) 0.2 Area: (cm) 23.876 Volume: (cm) 4.775 % Reduction in Area: -106.8% % Reduction in Volume: -313.4% Epithelialization: None Tunneling: No Undermining: No Wound Description Full Thickness Without Exposed Foul Odor Aft Classification: Support Structures Wound Margin: Distinct, outline attached Small [name], [name] ([id number]) er Cleansing: No Exudate Amount: Exudate  Type: Serosanguineous Exudate Color: red, brown Wound Bed Granulation Amount: Small (1-33%) Exposed Structure Granulation Quality: Pink Fascia Exposed: No Necrotic Amount: Large (67-100%) Fat Layer Exposed: No Necrotic Quality: Adherent Slough Tendon Exposed: No Muscle Exposed: No Joint Exposed: No Bone Exposed: No Limited to Skin Breakdown Periwound Skin Texture Texture Color No Abnormalities Noted: No No Abnormalities Noted: No Callus: No Atrophie Blanche: No Crepitus: No Cyanosis: No Excoriation: No Ecchymosis: No Fluctuance: No Erythema: No Friable: No Hemosiderin Staining: No Induration: No Mottled: No Localized Edema: No Pallor: No Rash: No Rubor: No Scarring: Yes Temperature / Pain Moisture Temperature: No Abnormality No Abnormalities Noted: No Tenderness on Palpation: Yes Dry / Scaly: No Maceration: No Moist: Yes Wound Preparation Ulcer Cleansing: Other: soap and water, Topical Anesthetic Applied: Other: lidocaine 4%, Treatment Notes Wound #2 (Right, Medial Malleolus) 1. Cleansed with: Cleanse wound with antibacterial soap and water 3. Peri-wound Care: Barrier cream 4. Dressing Applied: Santyl Ointment 5. Secondary Dressing  Applied [name], [name] ABD Pad Dry Gauze 7. Secured with 4-Layer Compression System - Right Lower Extremity Notes drawtex Electronic Signature(s) Signed: 04/20/2016 3:54:21 PM By: [name] BSN, RN Entered By: [name] on 04/20/2016 08:55:54 [name] ([id number]) -------------------------------------------------------------------------------- Vitals Details Patient Name: [name] Date of Service: 04/20/2016 8:00 AM Medical Record Number: [id number] Patient Account Number: [id number] Date of Birth/Sex: [birth year] (70 y.o. Male) Treating RN: Afful, RN, BSN, Rita Primary Care Physician: Other Clinician: Referring Physician: SELF, REFERRED Treating Physician/Extender: Rudene Re in Treatment: 13 Vital Signs Time Taken: 08:22 Temperature (F): 98.1 Height (in): 76 Pulse (bpm): 76 Weight (lbs): 238 Respiratory Rate (breaths/min): 17 Body Mass Index (BMI): 29 Blood Pressure (mmHg): 148/70 Reference Range: 80 - 120 mg / dl Electronic Signature(s) Signed: 04/20/2016 3:54:21 PM By: [name] BSN, RN Entered By: [name] on 04/20/2016 08:54:11
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
## 2016-04-24 ENCOUNTER — Encounter: Payer: Medicare HMO | Admitting: Internal Medicine

## 2016-04-24 DIAGNOSIS — I87331 Chronic venous hypertension (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: Secondary | ICD-10-CM | POA: Diagnosis not present

## 2016-04-26 NOTE — Progress Notes (Signed)
[name], Demarquez C. ([id number]) Visit Report for 04/24/2016 Arrival Information Details [name], [name] Date of Service: 04/24/2016 8:00 AM Patient Name: C. Patient Account Number: [id number] Medical Record Treating RN: Phillis Haggisinkerton, [name] [id number] Number: Other Clinician: Date of Birth/Sex: 05-May-1946 1(70 y.o. Male) Treating [name], [name] Primary Care Physician: Physician/Extender: G Referring Physician: SELF, REFERRED Weeks in Treatment: 14 Visit Information History Since Last Visit All ordered tests and consults were completed: No Patient Arrived: Ambulatory Added or deleted any medications: No Arrival Time: 08:02 Any new allergies or adverse reactions: No Accompanied By: self Had a fall or experienced change in No Transfer Assistance: None activities of daily living that may affect Patient Identification Verified: Yes risk of falls: Secondary Verification Process Yes Signs or symptoms of abuse/neglect since last No Completed: visito Patient Requires Transmission- No Hospitalized since last visit: No Based Precautions: Pain Present Now: No Patient Has Alerts: Yes Patient Alerts: Patient on Blood Thinner Electronic Signature(s) Signed: 04/24/2016 4:22:14 PM By: Alejandro MullingPinkerton, Debra Entered By: Alejandro MullingPinkerton, Debra on 04/24/2016 08:02:48 [name], [name] ([id number]) -------------------------------------------------------------------------------- Encounter Discharge Information Details [name], [name] Date of Service: 04/24/2016 8:00 AM Patient Name: C. Patient Account Number: [id number] Medical Record Treating RN: Phillis Haggisinkerton, [name] [id number] Number: Other Clinician: Date of Birth/Sex: 05-May-1946 [age] y.o. Male) Treating [name], [name] Primary Care Physician: Physician/Extender: G Referring Physician: SELF, REFERRED Weeks in Treatment: 14 Encounter Discharge Information Items Discharge Pain Level: 0 Discharge Condition: Stable Ambulatory Status: Ambulatory Discharge  Destination: Home Transportation: Private Auto Accompanied By: SELF Schedule Follow-up Appointment: Yes Medication Reconciliation completed and provided to Patient/Care Yes Taqwa Deem: Provided on Clinical Summary of Care: 04/24/2016 Form Type Recipient Paper Patient LB Electronic Signature(s) Signed: 04/24/2016 8:45:35 AM By: Gwenlyn PerkingMoore, Shelia Entered By: Gwenlyn PerkingMoore, Shelia on 04/24/2016 08:45:34 [name], [name] ([id number]) -------------------------------------------------------------------------------- Lower Extremity Assessment Details [name], [name] Date of Service: 04/24/2016 8:00 AM Patient Name: C. Patient Account Number: [id number] Medical Record Treating RN: Phillis Haggisinkerton, [name] [id number] Number: Other Clinician: Date of Birth/Sex: 05-May-1946 [age] y.o. Male) Treating [name], [name] Primary Care Physician: Physician/Extender: G Referring Physician: SELF, REFERRED Weeks in Treatment: 14 Edema Assessment Assessed: [Left: No] [Right: No] E[Left: dema] [Right: :] Calf Left: Right: Point of Measurement: 40 cm From Medial Instep cm 35.2 cm Ankle Left: Right: Point of Measurement: 12 cm From Medial Instep cm 24.6 cm Vascular Assessment Pulses: Posterior Tibial Dorsalis Pedis Palpable: [Right:Yes] Extremity colors, hair growth, and conditions: Extremity Color: [Right:Hyperpigmented] Temperature of Extremity: [Right:Warm] Capillary Refill: [Right:< 3 seconds] Toe Nail Assessment Left: Right: Thick: Yes Discolored: Yes Deformed: No Improper Length and Hygiene: No Electronic Signature(s) Signed: 04/24/2016 4:22:14 PM By: Alejandro MullingPinkerton, Debra Entered By: Alejandro MullingPinkerton, Debra on 04/24/2016 08:09:23 [name], Inmer C. ([id number]) [name], [name] -------------------------------------------------------------------------------- Multi Wound Chart Details [name], [name] Date of Service: 04/24/2016 8:00 AM Patient Name: C. Patient Account Number: [id number] Medical  Record Treating RN: Phillis Haggisinkerton, [name] [id number] Number: Other Clinician: Date of Birth/Sex: 05-May-1946 [age] y.o. Male) Treating [name], [name] Primary Care Physician: Physician/Extender: G Referring Physician: SELF, REFERRED Weeks in Treatment: 14 Vital Signs Height(in): 76 Pulse(bpm): 89 Weight(lbs): 238 Blood Pressure 137/71 (mmHg): Body Mass Index(BMI): 29 Temperature(F): 98.2 Respiratory Rate 18 (breaths/min): Photos: [1:No Photos] [2:No Photos] [N/A:N/A] Wound Location: [1:Right Malleolus - Lateral] [2:Right Malleolus - Medial N/A] Wounding Event: [1:Gradually Appeared] [2:Gradually Appeared] [N/A:N/A] Primary Etiology: [1:Venous Leg Ulcer] [2:Arterial Insufficiency Ulcer N/A] Comorbid History: [1:Cataracts, Chronic Obstructive Pulmonary Disease (COPD), Osteoarthritis] [2:Cataracts, Chronic Obstructive Pulmonary Disease (COPD), Osteoarthritis] [N/A:N/A] Date Acquired: [1:08/11/2015] [2:01/30/2016] [N/A:N/A] Weeks of Treatment: [1:14] [2:12] [N/A:N/A] Wound Status: [  1:Open] [2:Open] [N/A:N/A] Clustered Wound: [1:No] [2:Yes] [N/A:N/A] Measurements L x W x D 7x8.3x0.2 [2:7x3.6x0.2] [N/A:N/A] (cm) Area (cm) : [1:45.632] [2:19.792] [N/A:N/A] Volume (cm) : [1:9.126] [2:3.958] [N/A:N/A] % Reduction in Area: [1:-164.10%] [2:-71.40%] [N/A:N/A] % Reduction in Volume: -76.00% [2:-242.70%] [N/A:N/A] Classification: [1:Full Thickness Without Exposed Support Structures] [2:Full Thickness Without Exposed Support Structures] [N/A:N/A] Exudate Amount: [1:Large] [2:Large] [N/A:N/A] Exudate Type: [1:Serosanguineous] [2:Serosanguineous] [N/A:N/A] Exudate Color: [1:red, brown] [2:red, brown] [N/A:N/A] Wound Margin: [1:Thickened] [2:Distinct, outline attached] [N/A:N/A] Granulation Amount: [1:Small (1-33%)] [2:Small (1-33%)] [N/A:N/A] Granulation Quality: [1:Pink] [2:Pink] [N/A:N/A] Necrotic Amount: [1:Large (67-100%)] [2:Large (67-100%)] [N/A:N/A] Exposed Structures: Fascia: No Fascia:  No N/A Fat: No Fat: No Tendon: No Tendon: No Muscle: No Muscle: No Joint: No Joint: No Bone: No Bone: No Limited to Skin Limited to Skin Breakdown Breakdown Epithelialization: None None N/A Periwound Skin Texture: Scarring: Yes Scarring: Yes N/A Edema: No Edema: No Excoriation: No Excoriation: No Induration: No Induration: No Callus: No Callus: No Crepitus: No Crepitus: No Fluctuance: No Fluctuance: No Friable: No Friable: No Rash: No Rash: No Periwound Skin Moist: Yes Moist: Yes N/A Moisture: Maceration: No Maceration: No Dry/Scaly: No Dry/Scaly: No Periwound Skin Color: Atrophie Blanche: No Atrophie Blanche: No N/A Cyanosis: No Cyanosis: No Ecchymosis: No Ecchymosis: No Erythema: No Erythema: No Hemosiderin Staining: No Hemosiderin Staining: No Mottled: No Mottled: No Pallor: No Pallor: No Rubor: No Rubor: No Temperature: No Abnormality No Abnormality N/A Tenderness on Yes Yes N/A Palpation: Wound Preparation: Ulcer Cleansing: Other: Ulcer Cleansing: Other: N/A soap and water soap and water Topical Anesthetic Topical Anesthetic Applied: Other: lidocaine 4 Applied: Other: lidocaine % 4% Treatment Notes Electronic Signature(s) Signed: 04/24/2016 4:22:14 PM By: Alejandro Mulling Entered By: Alejandro Mulling on 04/24/2016 08:16:45 [name] Stack ([id number]) -------------------------------------------------------------------------------- Multi-Disciplinary Care Plan Details [name] Date of Service: 04/24/2016 8:00 AM Patient Name: C. Patient Account Number: [id number] Medical Record Treating RN: [name] [id number] Number: Other Clinician: Date of Birth/Sex: 21-Mar-1946 (70 y.o. Male) Treating [name], [name] Primary Care Physician: Physician/Extender: G Referring Physician: SELF, REFERRED Weeks in Treatment: 14 Active Inactive Venous Leg Ulcer Nursing Diagnoses: Knowledge deficit related to disease process and  management Goals: Patient will maintain optimal edema control Date Initiated: 04/03/2016 Goal Status: Active Interventions: Compression as ordered Treatment Activities: Therapeutic compression applied : 04/03/2016 Notes: Wound/Skin Impairment Nursing Diagnoses: Impaired tissue integrity Goals: Ulcer/skin breakdown will heal within 14 weeks Date Initiated: 01/17/2016 Goal Status: Active Interventions: Assess ulceration(s) every visit Notes: Electronic Signature(s) Signed: 04/24/2016 4:22:14 PM By: Aurelio Jew, [name] ([id number]) Entered By: Alejandro Mulling on 04/24/2016 08:16:40 [name] Stack ([id number]) -------------------------------------------------------------------------------- Pain Assessment Details [name] Date of Service: 04/24/2016 8:00 AM Patient Name: C. Patient Account Number: [id number] Medical Record Treating RN: [name] [id number] Number: Other Clinician: Date of Birth/Sex: August 26, 1946 (70 y.o. Male) Treating [name], [name] Primary Care Physician: Physician/Extender: G Referring Physician: SELF, REFERRED Weeks in Treatment: 14 Active Problems Location of Pain Severity and Description of Pain Patient Has Paino No Site Locations With Dressing Change: No Pain Management and Medication Current Pain Management: Electronic Signature(s) Signed: 04/24/2016 4:22:14 PM By: Alejandro Mulling Entered By: Alejandro Mulling on 04/24/2016 08:02:54 [name] Stack ([id number]) -------------------------------------------------------------------------------- Patient/Caregiver Education Details [name] Date of Service: 04/24/2016 8:00 AM Patient Name: C. Patient Account Number: [id number] Medical Record Treating RN: [name] [id number] Number: Other Clinician: Date of Birth/Gender: 1946/03/06 (70 y.o. Male) Treating [name] Primary Care Physician: Physician/Extender: G Referring Physician: SELF,  REFERRED Weeks in Treatment: 14 Education Assessment Education Provided  To: Patient Education Topics Provided Wound/Skin Impairment: Handouts: Other: do not get wrap wet Methods: Demonstration, Explain/Verbal Responses: State content correctly Electronic Signature(s) Signed: 04/24/2016 4:22:14 PM By: Alejandro Mulling Entered By: Alejandro Mulling on 04/24/2016 08:22:16 [name] Stack ([id number]) -------------------------------------------------------------------------------- Wound Assessment Details [name] Date of Service: 04/24/2016 8:00 AM Patient Name: C. Patient Account Number: [id number] Medical Record Treating RN: [name] [id number] Number: Other Clinician: Date of Birth/Sex: 1946/04/05 (70 y.o. Male) Treating [name], [name] Primary Care Physician: Physician/Extender: G Referring Physician: SELF, REFERRED Weeks in Treatment: 14 Wound Status Wound Number: 1 Primary Venous Leg Ulcer Etiology: Wound Location: Right Malleolus - Lateral Wound Open Wounding Event: Gradually Appeared Status: Date Acquired: 08/11/2015 Comorbid Cataracts, Chronic Obstructive Weeks Of Treatment: 14 History: Pulmonary Disease (COPD), Clustered Wound: No Osteoarthritis Photos Photo Uploaded By: Alejandro Mulling on 04/24/2016 15:47:20 Wound Measurements Length: (cm) 7 Width: (cm) 8.3 Depth: (cm) 0.2 Area: (cm) 45.632 Volume: (cm) 9.126 % Reduction in Area: -164.1% % Reduction in Volume: -76% Epithelialization: None Tunneling: No Undermining: No Wound Description Full Thickness Without Exposed Classification: Support Structures Wound Margin: Thickened Exudate Large Amount: Exudate Type: Serosanguineous Exudate Color: red, brown Foul Odor After Cleansing: No Wound Bed [name], [name] ([id number]) Granulation Amount: Small (1-33%) Exposed Structure Granulation Quality: Pink Fascia Exposed: No Necrotic Amount: Large (67-100%) Fat Layer Exposed:  No Necrotic Quality: Adherent Slough Tendon Exposed: No Muscle Exposed: No Joint Exposed: No Bone Exposed: No Limited to Skin Breakdown Periwound Skin Texture Texture Color No Abnormalities Noted: No No Abnormalities Noted: No Callus: No Atrophie Blanche: No Crepitus: No Cyanosis: No Excoriation: No Ecchymosis: No Fluctuance: No Erythema: No Friable: No Hemosiderin Staining: No Induration: No Mottled: No Localized Edema: No Pallor: No Rash: No Rubor: No Scarring: Yes Temperature / Pain Moisture Temperature: No Abnormality No Abnormalities Noted: No Tenderness on Palpation: Yes Dry / Scaly: No Maceration: No Moist: Yes Wound Preparation Ulcer Cleansing: Other: soap and water, Topical Anesthetic Applied: Other: lidocaine 4 %, Treatment Notes Wound #1 (Right, Lateral Malleolus) 1. Cleansed with: Cleanse wound with antibacterial soap and water 2. Anesthetic Topical Lidocaine 4% cream to wound bed prior to debridement 3. Peri-wound Care: Barrier cream Moisturizing lotion Other peri-wound care (specify in notes) 4. Dressing Applied: Santyl Ointment Medihoney Gel 5. Secondary Dressing Applied ABD Pad [name], Rylend C. ([id number]) Dry Gauze 7. Secured with Tape 4-Layer Compression System - Right Lower Extremity Notes DRAWTEX, UNNA TO ANCHOR, TCA Electronic Signature(s) Signed: 04/24/2016 4:22:14 PM By: Alejandro Mulling Entered By: Alejandro Mulling on 04/24/2016 08:13:47 [name] Stack ([id number]) -------------------------------------------------------------------------------- Wound Assessment Details [name] Date of Service: 04/24/2016 8:00 AM Patient Name: C. Patient Account Number: [id number] Medical Record Treating RN: [name] [id number] Number: Other Clinician: Date of Birth/Sex: [birth year] (70 y.o. Male) Treating [name], [name] Primary Care Physician: Physician/Extender: G Referring Physician: SELF, REFERRED Weeks in  Treatment: 14 Wound Status Wound Number: 2 Primary Arterial Insufficiency Ulcer Etiology: Wound Location: Right Malleolus - Medial Wound Open Wounding Event: Gradually Appeared Status: Date Acquired: 01/30/2016 Comorbid Cataracts, Chronic Obstructive Weeks Of Treatment: 12 History: Pulmonary Disease (COPD), Clustered Wound: Yes Osteoarthritis Photos Photo Uploaded By: Alejandro Mulling on 04/24/2016 15:47:21 Wound Measurements Length: (cm) 7 Width: (cm) 3.6 Depth: (cm) 0.2 Area: (cm) 19.792 Volume: (cm) 3.958 % Reduction in Area: -71.4% % Reduction in Volume: -242.7% Epithelialization: None Tunneling: No Undermining: No Wound Description Full Thickness Without Exposed Classification: Support Structures Wound Margin: Distinct, outline attached Exudate Large Amount: Exudate Type: Serosanguineous Exudate Color: red, brown Foul Odor  After Cleansing: No Wound Bed Wester, Tyrann C. ([id number]) Granulation Amount: Small (1-33%) Exposed Structure Granulation Quality: Pink Fascia Exposed: No Necrotic Amount: Large (67-100%) Fat Layer Exposed: No Necrotic Quality: Adherent Slough Tendon Exposed: No Muscle Exposed: No Joint Exposed: No Bone Exposed: No Limited to Skin Breakdown Periwound Skin Texture Texture Color No Abnormalities Noted: No No Abnormalities Noted: No Callus: No Atrophie Blanche: No Crepitus: No Cyanosis: No Excoriation: No Ecchymosis: No Fluctuance: No Erythema: No Friable: No Hemosiderin Staining: No Induration: No Mottled: No Localized Edema: No Pallor: No Rash: No Rubor: No Scarring: Yes Temperature / Pain Moisture Temperature: No Abnormality No Abnormalities Noted: No Tenderness on Palpation: Yes Dry / Scaly: No Maceration: No Moist: Yes Wound Preparation Ulcer Cleansing: Other: soap and water, Topical Anesthetic Applied: Other: lidocaine 4%, Electronic Signature(s) Signed: 04/24/2016 4:22:14 PM By: Alejandro Mulling Entered By: Alejandro Mulling on 04/24/2016 08:14:32 [name] Stack ([id number]) -------------------------------------------------------------------------------- Vitals Details [name] Date of Service: 04/24/2016 8:00 AM Patient Name: C. Patient Account Number: [id number] Medical Record Treating RN: [name] [id number] Number: Other Clinician: Date of Birth/Sex: 14-May-1946 (69 y.o. Male) Treating [name], [name] Primary Care Physician: Physician/Extender: G Referring Physician: SELF, REFERRED Weeks in Treatment: 14 Vital Signs Time Taken: 08:04 Temperature (F): 98.2 Height (in): 76 Pulse (bpm): 89 Weight (lbs): 238 Respiratory Rate (breaths/min): 18 Body Mass Index (BMI): 29 Blood Pressure (mmHg): 137/71 Reference Range: 80 - 120 mg / dl Electronic Signature(s) Signed: 04/24/2016 4:22:14 PM By: Alejandro Mulling Entered By: Alejandro Mulling on 04/24/2016 08:04:37

## 2016-04-26 NOTE — Progress Notes (Signed)
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 PM By: [name] MD Entered By: [name] on 04/24/2016 08:32:48 [name] ([id number]) -------------------------------------------------------------------------------- SuperBill Details [name] Date of Service: 04/24/2016 Patient Name: C. Patient Account Number: [id number] Medical Record Treating RN: [name] [id number] Number: Other Clinician: Date of Birth/Sex: Feb 01, 1946 (70 y.o. 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 Facility Procedures CPT4 Code Description: 16109604 11042 - DEB SUBQ TISSUE 20 SQ CM/< ICD-10 Description Diagnosis L97.313 Non-pressure chronic ulcer of right ankle with necros Modifier: is of muscle Quantity: 1 CPT4 Code Description: 54098119  11045 - DEB SUBQ TISS EA ADDL 20CM ICD-10 Description Diagnosis L97.313 Non-pressure chronic ulcer of right ankle with necros Modifier: is of muscle Quantity: 3 Physician Procedures CPT4 Code Description: 1478295 11042 - WC PHYS SUBQ TISS 20 SQ CM ICD-10 Description Diagnosis L97.313 Non-pressure chronic ulcer of right ankle with necros Modifier: is of muscle Quantity: 1 CPT4 Code Description: 6213086 11045 - WC PHYS SUBQ TISS EA ADDL 20 CM ICD-10 Description Diagnosis L97.313 Non-pressure chronic ulcer of right ankle with necros RAFEAL, SKIBICKI ([id number]) Modifier: is of muscle Quantity: 3 Electronic Signature(s) Signed: 04/25/2016 4:32:31 PM By: [name] MD Entered By: [name] on 04/24/2016 08:34:04

## 2016-04-27 ENCOUNTER — Encounter: Payer: Medicare HMO | Attending: Surgery

## 2016-04-27 DIAGNOSIS — M1990 Unspecified osteoarthritis, unspecified site: Secondary | ICD-10-CM | POA: Diagnosis not present

## 2016-04-27 DIAGNOSIS — T85613A 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, initial encounter: Secondary | ICD-10-CM | POA: Diagnosis not present

## 2016-04-27 DIAGNOSIS — I87331 Chronic venous hypertension (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: Secondary | ICD-10-CM | POA: Insufficient documentation

## 2016-04-27 DIAGNOSIS — Y832 Surgical operation with anastomosis, bypass or graft as the cause of abnormal reaction of the patient, or of later complication, without mention of misadventure at the time of the procedure: Secondary | ICD-10-CM | POA: Diagnosis not present

## 2016-04-27 DIAGNOSIS — L97313 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: Secondary | ICD-10-CM | POA: Diagnosis not present

## 2016-04-27 DIAGNOSIS — J449 Chronic obstructive pulmonary disease, unspecified: Secondary | ICD-10-CM | POA: Insufficient documentation

## 2016-04-28 NOTE — Progress Notes (Signed)
[name], Michaelangelo C. ([id number]) Visit Report for 04/27/2016 Arrival Information Details Patient Name: [name], [name] Date of Service: 04/27/2016 1:30 PM Medical Record Number: [id number] Patient Account Number: [id number] Date of Birth/Sex: 1946/08/10 103(70 y.o. Male) Treating RN: Clover MealyAfful, RN, BSN, [place] Sinkita Primary Care Physician: Other Clinician: Referring Physician: SELF, REFERRED Treating Physician/Extender: Rudene ReBritto, Errol Weeks in Treatment: 14 Visit Information History Since Last Visit All ordered tests and consults were completed: No Patient Arrived: Ambulatory Added or deleted any medications: No Arrival Time: 13:12 Any new allergies or adverse reactions: No Accompanied By: self Had a fall or experienced change in No Transfer Assistance: None activities of daily living that may affect Patient Identification Verified: Yes risk of falls: Secondary Verification Process Yes Signs or symptoms of abuse/neglect since last No Completed: visito Patient Requires Transmission- No Hospitalized since last visit: No Based Precautions: Has Dressing in Place as Prescribed: Yes Patient Has Alerts: Yes Pain Present Now: No Patient Alerts: Patient on Blood Thinner Electronic Signature(s) Signed: 04/27/2016 3:26:34 PM By: Elpidio EricAfful, Rita BSN, RN Entered By: Elpidio EricAfful, Rita on 04/27/2016 13:12:45 [name], [name] ([id number]) -------------------------------------------------------------------------------- Encounter Discharge Information Details Patient Name: [name], [name] Date of Service: 04/27/2016 1:30 PM Medical Record Number: [id number] Patient Account Number: [id number] Date of Birth/Sex: 1946/08/10 81(70 y.o. Male) Treating RN: Clover MealyAfful, RN, BSN, [place] Sinkita Primary Care Physician: Other Clinician: Referring Physician: SELF, REFERRED Treating Physician/Extender: Rudene ReBritto, Errol Weeks in Treatment: 14 Encounter Discharge Information Items Discharge Pain Level: 0 Discharge Condition:  Stable Ambulatory Status: Ambulatory Discharge Destination: Home Private Transportation: Auto Accompanied By: self Schedule Follow-up Appointment: No Medication Reconciliation completed and No provided to Patient/Care Emmanuelle Coxe: Clinical Summary of Care: Electronic Signature(s) Signed: 04/27/2016 2:04:36 PM By: Elpidio EricAfful, Rita BSN, RN Entered By: Elpidio EricAfful, Rita on 04/27/2016 14:04:36 [name], [name] ([id number]) -------------------------------------------------------------------------------- Patient/Caregiver Education Details Patient Name: [name], [name] Date of Service: 04/27/2016 1:30 PM Medical Record Number: [id number] Patient Account Number: [id number] Date of Birth/Gender: 1946/08/10 65(70 y.o. Male) Treating RN: Clover MealyAfful, RN, BSN, [place] Sinkita Primary Care Physician: Other Clinician: Referring Physician: SELF, REFERRED Treating Physician/Extender: Rudene ReBritto, Errol Weeks in Treatment: 14 Education Assessment Education Provided To: Patient Education Topics Provided Wound/Skin Impairment: Methods: Explain/Verbal Responses: State content correctly Electronic Signature(s) Signed: 04/27/2016 3:26:34 PM By: Elpidio EricAfful, Rita BSN, RN Entered By: Elpidio EricAfful, Rita on 04/27/2016 14:04:23 [name], [name] ([id number]) -------------------------------------------------------------------------------- Wound Assessment Details Patient Name: [name], [name] Date of Service: 04/27/2016 1:30 PM Medical Record Number: [id number] Patient Account Number: [id number] Date of Birth/Sex: 1946/08/10 4(70 y.o. Male) Treating RN: Afful, RN, BSN, Rita Primary Care Physician: Other Clinician: Referring Physician: SELF, REFERRED Treating Physician/Extender: Rudene ReBritto, Errol Weeks in Treatment: 14 Wound Status Wound Number: 1 Primary Venous Leg Ulcer Etiology: Wound Location: Right Malleolus - Lateral Wound Open Wounding Event: Gradually Appeared Status: Date Acquired: 08/11/2015 Comorbid Cataracts, Chronic  Obstructive Weeks Of Treatment: 14 History: Pulmonary Disease (COPD), Clustered Wound: No Osteoarthritis Wound Measurements Length: (cm) 7 Width: (cm) 8.3 Depth: (cm) 0.2 Area: (cm) 45.632 Volume: (cm) 9.126 % Reduction in Area: -164.1% % Reduction in Volume: -76% Epithelialization: None Tunneling: No Undermining: No Wound Description Full Thickness Without Exposed Classification: Support Structures Wound Margin: Thickened Exudate Large Amount: Exudate Type: Serosanguineous Exudate Color: red, brown Foul Odor After Cleansing: No Wound Bed Granulation Amount: Small (1-33%) Exposed Structure Granulation Quality: Pink Fascia Exposed: No Necrotic Amount: Large (67-100%) Fat Layer Exposed: No Necrotic Quality: Adherent Slough Tendon Exposed: No Muscle Exposed: No Joint Exposed: No Bone Exposed: No Limited to Skin Breakdown Periwound Skin Texture Texture  Color No Abnormalities Noted: No No Abnormalities Noted: No Callus: No Atrophie Blanche: No SASCHA, BAUGHER. ([id number]) Crepitus: No Cyanosis: No Excoriation: No Ecchymosis: No Fluctuance: No Erythema: No Friable: No Hemosiderin Staining: No Induration: No Mottled: No Localized Edema: No Pallor: No Rash: No Rubor: No Scarring: Yes Temperature / Pain Moisture Temperature: No Abnormality No Abnormalities Noted: No Tenderness on Palpation: Yes Dry / Scaly: No Maceration: No Moist: Yes Wound Preparation Ulcer Cleansing: Other: soap and water, Topical Anesthetic Applied: None Treatment Notes Wound #1 (Right, Lateral Malleolus) 1. Cleansed with: Cleanse wound with antibacterial soap and water 3. Peri-wound Care: Barrier cream Moisturizing lotion Other peri-wound care (specify in notes) 4. Dressing Applied: Santyl Ointment Medihoney Gel 5. Secondary Dressing Applied ABD Pad Dry Gauze 7. Secured with Tape 4-Layer Compression System - Right Lower Extremity Notes DRAWTEX, UNNA TO  ANCHOR, TCA Electronic Signature(s) Signed: 04/27/2016 3:26:34 PM By: [name] BSN, RN Entered By: [name] on 04/27/2016 14:03:17 [name] Stack ([id number]) -------------------------------------------------------------------------------- Wound Assessment Details Patient Name: [name] Stack Date of Service: 04/27/2016 1:30 PM Medical Record Number: [id number] Patient Account Number: [id number] Date of Birth/Sex: 1946/05/06 (70 y.o. Male) Treating RN: Afful, RN, BSN, Rita Primary Care Physician: Other Clinician: Referring Physician: SELF, REFERRED Treating Physician/Extender: Rudene Re in Treatment: 14 Wound Status Wound Number: 2 Primary Venous Leg Ulcer Etiology: Wound Location: Right Malleolus - Medial Wound Open Wounding Event: Gradually Appeared Status: Date Acquired: 01/30/2016 Comorbid Cataracts, Chronic Obstructive Weeks Of Treatment: 12 History: Pulmonary Disease (COPD), Clustered Wound: Yes Osteoarthritis Wound Measurements Length: (cm) 7 Width: (cm) 3.6 Depth: (cm) 0.2 Area: (cm) 19.792 Volume: (cm) 3.958 % Reduction in Area: -71.4% % Reduction in Volume: -242.7% Epithelialization: None Tunneling: No Undermining: No Wound Description Full Thickness Without Exposed Classification: Support Structures Wound Margin: Distinct, outline attached Exudate Large Amount: Exudate Type: Serosanguineous Exudate Color: red, brown Foul Odor After Cleansing: No Wound Bed Granulation Amount: Small (1-33%) Exposed Structure Granulation Quality: Pink Fascia Exposed: No Necrotic Amount: Large (67-100%) Fat Layer Exposed: No Necrotic Quality: Adherent Slough Tendon Exposed: No Muscle Exposed: No Joint Exposed: No Bone Exposed: No Limited to Skin Breakdown Periwound Skin Texture Texture Color No Abnormalities Noted: No No Abnormalities Noted: No Callus: No Atrophie Blanche: No Yard, Griffen C. ([id number]) Crepitus: No Cyanosis:  No Excoriation: No Ecchymosis: No Fluctuance: No Erythema: No Friable: No Hemosiderin Staining: No Induration: No Mottled: No Localized Edema: No Pallor: No Rash: No Rubor: No Scarring: Yes Temperature / Pain Moisture Temperature: No Abnormality No Abnormalities Noted: No Tenderness on Palpation: Yes Dry / Scaly: No Maceration: No Moist: Yes Wound Preparation Ulcer Cleansing: Other: soap and water, Topical Anesthetic Applied: None Treatment Notes Wound #2 (Right, Medial Malleolus) 1. Cleansed with: Cleanse wound with antibacterial soap and water 3. Peri-wound Care: Barrier cream Moisturizing lotion Other peri-wound care (specify in notes) 4. Dressing Applied: Santyl Ointment Medihoney Gel 5. Secondary Dressing Applied ABD Pad Dry Gauze 7. Secured with Tape 4-Layer Compression System - Right Lower Extremity Notes DRAWTEX, UNNA TO ANCHOR, TCA Electronic Signature(s) Signed: 04/27/2016 3:26:34 PM By: [name] BSN, RN Entered By: [name] on 04/27/2016 14:03:33

## 2016-05-01 ENCOUNTER — Encounter: Payer: Medicare HMO | Admitting: Internal Medicine

## 2016-05-01 DIAGNOSIS — I87331 Chronic venous hypertension (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: Secondary | ICD-10-CM | POA: Diagnosis not present

## 2016-05-02 NOTE — Progress Notes (Signed)
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 1 week. Edema Control: Wound #1 Right,Lateral Malleolus: 4-Layer Compression System - Right Lower Extremity - UNNA TO ANCHOR Wound #2 Right,Medial Malleolus: 4-Layer Compression System - Right Lower Extremity - UNNA TO ANCHOR Additional Orders / Instructions: Wound #1 Right,Lateral Malleolus: Increase protein intake. OK to return to work with the following restrictions: Activity as tolerated Wound #2 Right,Medial Malleolus: Increase protein  intake. OK to return to work with the following restrictions: Activity as tolerated #1 continue Santyl/Metahydrin he #2 Hydrofera Blue week #3 I still think there is a possibility that especially the lateral aspect of this wound will require a skin substitute possibly Apligraf although I am no longer quite as certain about this as it once was Electronic Signature(s) Signed: 05/02/2016 7:55:14 AM By: [name] MD Entered By: [name] on 05/01/2016 08:35:33 [name]  ([id number]) -------------------------------------------------------------------------------- SuperBill Details [name] Date of Service: 05/01/2016 Patient Name: C. Patient Account Number: [id number] Medical Record Treating RN: [name] [id number] Number: Other Clinician: Date of Birth/Sex: 1945/11/20 (70 y.o. 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 Facility Procedures CPT4 Code Description: 78295621 11042 - DEB SUBQ TISSUE 20 SQ CM/< ICD-10 Description Diagnosis L97.313 Non-pressure chronic ulcer of right ankle with necros Modifier: is of muscle Quantity: 1 CPT4 Code Description: 30865784 11045 - DEB SUBQ TISS EA ADDL 20CM ICD-10 Description Diagnosis L97.313 Non-pressure chronic ulcer of right ankle with necros Modifier: is of muscle Quantity: 3 Physician Procedures CPT4 Code Description: 6962952 11042 - WC PHYS SUBQ TISS 20 SQ CM ICD-10 Description Diagnosis L97.313 Non-pressure chronic ulcer of right ankle with necros Modifier: is of muscle Quantity: 1 CPT4 Code Description: 8413244 11045 - WC PHYS SUBQ TISS EA ADDL 20 CM ICD-10 Description Diagnosis L97.313 Non-pressure chronic ulcer of right ankle with necros [name], [name] ([id number]) Modifier: is of muscle Quantity: 3 Electronic Signature(s) Signed: 05/02/2016 7:55:14 AM By: [name] MD Entered By: [name] on 05/01/2016 08:36:06

## 2016-05-02 NOTE — Progress Notes (Signed)
[name], [name] ([id number]) Visit Report for 05/01/2016 Arrival Information Details [name], [name] Date of Service: 05/01/2016 8:00 AM Patient Name: C. Patient Account Number: [id number] Medical Record Treating RN: [name], [name] [id number] Number: Other Clinician: Date of Birth/Sex: 1946-02-08 7(70 y.o. Male) Treating [name], [name] Primary Care [name]: [name]/Extender: [name]: [name] Weeks in Treatment: 15 Visit Information History Since Last Visit All ordered tests and consults were completed: No Patient Arrived: Ambulatory Added or deleted any medications: No Arrival Time: 08:06 Any new allergies or adverse reactions: No Accompanied By: self Had a fall or experienced change in No Transfer Assistance: None activities of daily living that may affect Patient Identification Verified: Yes risk of falls: Secondary Verification Process Yes Signs or symptoms of abuse/neglect since last No Completed: visito Patient Requires Transmission- No Hospitalized since last visit: No Based Precautions: Pain Present Now: No Patient Has Alerts: Yes Patient Alerts: Patient on Blood Thinner Electronic Signature(s) Signed: 05/01/2016 4:41:03 PM By: [name], [name] Entered By: [name], [name] [name], [name] ([id number]) -------------------------------------------------------------------------------- Encounter Discharge Information Details [name], [name] Date of Service: 05/01/2016 8:00 AM Patient Name: C. Patient Account Number: [id number] Medical Record Treating RN: [name], [name] [id number] Number: Other Clinician: Date of Birth/Sex: 1946-02-08 44(70 y.o. Male) Treating [name], [name] Primary Care [name]: [name]/Extender: [name]: [name] Weeks in Treatment: 15 Encounter Discharge Information Items Discharge Pain Level: 0 Discharge Condition: Stable Ambulatory Status: Ambulatory Discharge  Destination: Home Transportation: Private Auto Accompanied By: self Schedule Follow-up Appointment: Yes Medication Reconciliation completed Yes and provided to Patient/Care [name]: Patient Clinical Summary of Care: Declined Electronic Signature(s) Signed: 05/01/2016 8:56:16 AM By: [name], [name] Entered By: [name], [name] on 05/01/2016 08:56:16 [name], [name] ([id number]) -------------------------------------------------------------------------------- Lower Extremity Assessment Details [name], [name] Date of Service: 05/01/2016 8:00 AM Patient Name: C. Patient Account Number: [id number] Medical Record Treating RN: [name], [name] [id number] Number: Other Clinician: Date of Birth/Sex: 1946-02-08 78(70 y.o. Male) Treating [name], [name] Primary Care [name]: [name]/Extender: [name]: [name] Weeks in Treatment: 15 Edema Assessment Assessed: [Left: No] [Right: No] E[Left: dema] [Right: :] Calf Left: Right: Point of Measurement: 40 cm From Medial Instep cm 345 cm Ankle Left: Right: Point of Measurement: 12 cm From Medial Instep cm 24.4 cm Vascular Assessment Pulses: Posterior Tibial Dorsalis Pedis Palpable: [Right:Yes] Extremity colors, hair growth, and conditions: Extremity Color: [Right:Hyperpigmented] Temperature of Extremity: [Right:Warm] Capillary Refill: [Right:< 3 seconds] Toe Nail Assessment Left: Right: Thick: Yes Discolored: Yes Deformed: No Improper Length and Hygiene: No Electronic Signature(s) Signed: 05/01/2016 4:41:03 PM By: [name], [name] Entered By: [name], [name] on 05/01/2016 08:12:21 [name], [name] ([name]) [name], [name] ([id number]) -------------------------------------------------------------------------------- Multi Wound Chart Details [name], [name] Date of Service: 05/01/2016 8:00 AM Patient Name: C. Patient Account Number: [id number] Medical Record Treating RN: [name],  [name] [id number] Number: Other Clinician: Date of Birth/Sex: 1946-02-08 27(70 y.o. Male) Treating [name], [name] Primary Care [name]: [name]/Extender: [name]: [name] Weeks in Treatment: 15 Photos: [1:No Photos] [2:No Photos] [N/A:N/A] Wound Location: [1:Right Malleolus - Lateral] [2:Right Malleolus - Medial] [N/A:N/A] Wounding Event: [1:Gradually Appeared] [2:Gradually Appeared] [N/A:N/A] Primary Etiology: [1:Venous Leg Ulcer] [2:Venous Leg Ulcer] [N/A:N/A] Comorbid History: [1:Cataracts, Chronic Obstructive Pulmonary Disease (COPD), Osteoarthritis] [2:Cataracts, Chronic Obstructive Pulmonary Disease (COPD), Osteoarthritis] [N/A:N/A] Date Acquired: [1:08/11/2015] [2:01/30/2016] [N/A:N/A] Weeks of Treatment: [1:15] [2:13] [N/A:N/A] Wound Status: [1:Open] [2:Open] [N/A:N/A] Clustered Wound: [1:No] [2:Yes] [N/A:N/A] Measurements L x W x D 5.6x8x0.2 [2:7x4x0.2] [N/A:N/A] (cm) Area (cm) : [1:35.186] [2:21.991] [N/A:N/A] Volume (cm) : [1:7.037] [2:4.398] [  N/A:N/A] % Reduction in Area: [1:-103.60%] [2:-90.50%] [N/A:N/A] % Reduction in Volume: -35.70% [2:-280.80%] [N/A:N/A] Classification: [1:Full Thickness Without Exposed Support Structures] [2:Full Thickness Without Exposed Support Structures] [N/A:N/A] Exudate Amount: [1:Large] [2:Large] [N/A:N/A] Exudate Type: [1:Serosanguineous] [2:Serosanguineous] [N/A:N/A] Exudate Color: [1:red, brown] [2:red, brown] [N/A:N/A] Wound Margin: [1:Thickened] [2:Distinct, outline attached] [N/A:N/A] Granulation Amount: [1:Small (1-33%)] [2:Small (1-33%)] [N/A:N/A] Granulation Quality: [1:Pink] [2:Pink] [N/A:N/A] Necrotic Amount: [1:Large (67-100%)] [2:Large (67-100%)] [N/A:N/A] Exposed Structures: [1:Fascia: No Fat: No Tendon: No Muscle: No Joint: No Bone: No Limited to Skin Breakdown] [2:Fascia: No Fat: No Tendon: No Muscle: No Joint: No Bone: No Limited to Skin Breakdown] [N/A:N/A] Epithelialization: None None N/A Periwound  Skin Texture: Scarring: Yes Scarring: Yes N/A Edema: No Edema: No Excoriation: No Excoriation: No Induration: No Induration: No Callus: No Callus: No Crepitus: No Crepitus: No Fluctuance: No Fluctuance: No Friable: No Friable: No Rash: No Rash: No Periwound Skin Moist: Yes Moist: Yes N/A Moisture: Maceration: No Maceration: No Dry/Scaly: No Dry/Scaly: No Periwound Skin Color: Atrophie Blanche: No Atrophie Blanche: No N/A Cyanosis: No Cyanosis: No Ecchymosis: No Ecchymosis: No Erythema: No Erythema: No Hemosiderin Staining: No Hemosiderin Staining: No Mottled: No Mottled: No Pallor: No Pallor: No Rubor: No Rubor: No Temperature: No Abnormality No Abnormality N/A Tenderness on Yes Yes N/A Palpation: Wound Preparation: Ulcer Cleansing: Other: Ulcer Cleansing: Other: N/A soap and water soap and water Topical Anesthetic Topical Anesthetic Applied: Other: lidocaine Applied: Other: lidocaine 4% 4% Treatment Notes Electronic Signature(s) Signed: 05/01/2016 4:41:03 PM By: [name] Entered By: [name] on 05/01/2016 08:21:24 [name] Stack ([id number]) -------------------------------------------------------------------------------- Multi-Disciplinary Care Plan Details [name] Ringer Date of Service: 05/01/2016 8:00 AM Patient Name: C. Patient Account Number: [id number] Medical Record Treating RN: [name] Haggis [id number] Number: Other Clinician: Date of Birth/Sex: 1945-12-03 (70 y.o. Male) Treating [name], [name] Primary Care [name]: [name]/Extender: [name]: [name] Weeks in Treatment: 15 Active Inactive Venous Leg Ulcer Nursing Diagnoses: Knowledge deficit related to disease process and management Goals: Patient will maintain optimal edema control Date Initiated: 04/03/2016 Goal Status: Active Interventions: Compression as ordered Treatment Activities: Therapeutic compression applied :  04/03/2016 Notes: Wound/Skin Impairment Nursing Diagnoses: Impaired tissue integrity Goals: Ulcer/skin breakdown will heal within 14 weeks Date Initiated: 01/17/2016 Goal Status: Active Interventions: Assess ulceration(s) every visit Notes: Electronic Signature(s) Signed: 05/01/2016 4:41:03 PM By: Aurelio Jew, [name] Severance ([id number]) Entered By: [name] on 05/01/2016 08:20:24 [name] Stack ([id number]) -------------------------------------------------------------------------------- Pain Assessment Details [name] Ringer Date of Service: 05/01/2016 8:00 AM Patient Name: C. Patient Account Number: [id number] Medical Record Treating RN: [name] Haggis [id number] Number: Other Clinician: Date of Birth/Sex: 01-20-1946 (70 y.o. Male) Treating [name], [name] Primary Care [name]: [name]/Extender: [name]: [name] Weeks in Treatment: 15 Active Problems Location of Pain Severity and Description of Pain Patient Has Paino No Site Locations With Dressing Change: No Pain Management and Medication Current Pain Management: Electronic Signature(s) Signed: 05/01/2016 4:41:03 PM By: [name] Entered By: [name] on 05/01/2016 08:06:58 [name] Stack ([id number]) -------------------------------------------------------------------------------- Patient/Caregiver Education Details [name] Ringer Date of Service: 05/01/2016 8:00 AM Patient Name: C. Patient Account Number: [id number] Medical Record Treating RN: [name] Haggis [id number] Number: Other Clinician: Date of Birth/Gender: April 07, 1946 (70 y.o. Male) Treating [name], [name] Primary Care [name]: [name]/Extender: [name]: [name] Weeks in Treatment: 15 Education Assessment Education Provided To: Patient Education Topics Provided Wound/Skin Impairment: Handouts: Other: do not get wrap wet Methods: Demonstration,  Explain/Verbal Responses: State content correctly Electronic Signature(s) Signed: 05/01/2016 4:41:03 PM By: [name]  Entered By: [name] on 05/01/2016 08:55:18 [name] Stack ([id number]) -------------------------------------------------------------------------------- Wound Assessment Details [name] Ringer Date of Service: 05/01/2016 8:00 AM Patient Name: C. Patient Account Number: [id number] Medical Record Treating RN: [name] Haggis [id number] Number: Other Clinician: Date of Birth/Sex: 10-21-1945 (70 y.o. Male) Treating [name], [name] Primary Care [name]: [name]/Extender: [name]: [name] Weeks in Treatment: 15 Wound Status Wound Number: 1 Primary Venous Leg Ulcer Etiology: Wound Location: Right Malleolus - Lateral Wound Open Wounding Event: Gradually Appeared Status: Date Acquired: 08/11/2015 Comorbid Cataracts, Chronic Obstructive Weeks Of Treatment: 15 History: Pulmonary Disease (COPD), Clustered Wound: No Osteoarthritis Photos Photo Uploaded By: [name] on 05/01/2016 10:24:37 Wound Measurements Length: (cm) 5.6 Width: (cm) 8 Depth: (cm) 0.2 Area: (cm) 35.186 Volume: (cm) 7.037 % Reduction in Area: -103.6% % Reduction in Volume: -35.7% Epithelialization: None Tunneling: No Undermining: No Wound Description Full Thickness Without Exposed Classification: Support Structures Wound Margin: Thickened Exudate Large Amount: Exudate Type: Serosanguineous Exudate Color: red, brown Foul Odor After Cleansing: No Wound Bed [name], [name] ([id number]) Granulation Amount: Small (1-33%) Exposed Structure Granulation Quality: Pink Fascia Exposed: No Necrotic Amount: Large (67-100%) Fat Layer Exposed: No Necrotic Quality: Adherent Slough Tendon Exposed: No Muscle Exposed: No Joint Exposed: No Bone Exposed: No Limited to Skin Breakdown Periwound Skin Texture Texture Color No Abnormalities  Noted: No No Abnormalities Noted: No Callus: No Atrophie Blanche: No Crepitus: No Cyanosis: No Excoriation: No Ecchymosis: No Fluctuance: No Erythema: No Friable: No Hemosiderin Staining: No Induration: No Mottled: No Localized Edema: No Pallor: No Rash: No Rubor: No Scarring: Yes Temperature / Pain Moisture Temperature: No Abnormality No Abnormalities Noted: No Tenderness on Palpation: Yes Dry / Scaly: No Maceration: No Moist: Yes Wound Preparation Ulcer Cleansing: Other: soap and water, Topical Anesthetic Applied: Other: lidocaine 4%, Treatment Notes Wound #1 (Right, Lateral Malleolus) 1. Cleansed with: Cleanse wound with antibacterial soap and water 2. Anesthetic Topical Lidocaine 4% cream to wound bed prior to debridement 3. Peri-wound Care: Barrier cream Moisturizing lotion Other peri-wound care (specify in notes) 4. Dressing Applied: Santyl Ointment Medihoney Gel 5. Secondary Dressing Applied ABD Pad [name], [name] ([id number]) Dry Gauze 7. Secured with Tape 4-Layer Compression System - Right Lower Extremity Notes xtrasorb, drawtex, TCA Electronic Signature(s) Signed: 05/01/2016 4:41:03 PM By: [name] Entered By: [name] on 05/01/2016 08:18:41 [name] Stack ([id number]) -------------------------------------------------------------------------------- Wound Assessment Details [name] Ringer Date of Service: 05/01/2016 8:00 AM Patient Name: C. Patient Account Number: [id number] Medical Record Treating RN: [name] Haggis [id number] Number: Other Clinician: Date of Birth/Sex: April 03, 1946 (70 y.o. Male) Treating [name], [name] Primary Care [name]: [name]/Extender: [name]: [name] Weeks in Treatment: 15 Wound Status Wound Number: 2 Primary Venous Leg Ulcer Etiology: Wound Location: Right Malleolus - Medial Wound Open Wounding Event: Gradually Appeared Status: Date Acquired:  01/30/2016 Comorbid Cataracts, Chronic Obstructive Weeks Of Treatment: 13 History: Pulmonary Disease (COPD), Clustered Wound: Yes Osteoarthritis Photos Photo Uploaded By: [name] on 05/01/2016 10:24:39 Wound Measurements Length: (cm) 7 Width: (cm) 4 Depth: (cm) 0.2 Area: (cm) 21.991 Volume: (cm) 4.398 % Reduction in Area: -90.5% % Reduction in Volume: -280.8% Epithelialization: None Tunneling: No Undermining: No Wound Description Full Thickness Without Exposed Classification: Support Structures Wound Margin: Distinct, outline attached Exudate Large Amount: Exudate Type: Serosanguineous Exudate Color: red, brown Foul Odor After Cleansing: No Wound Bed [name], [name] ([id number]) Granulation Amount: Small (1-33%) Exposed Structure Granulation Quality: Pink Fascia Exposed: No Necrotic Amount: Large (67-100%) Fat Layer Exposed: No Necrotic Quality: Adherent Slough  Tendon Exposed: No Muscle Exposed: No Joint Exposed: No Bone Exposed: No Limited to Skin Breakdown Periwound Skin Texture Texture Color No Abnormalities Noted: No No Abnormalities Noted: No Callus: No Atrophie Blanche: No Crepitus: No Cyanosis: No Excoriation: No Ecchymosis: No Fluctuance: No Erythema: No Friable: No Hemosiderin Staining: No Induration: No Mottled: No Localized Edema: No Pallor: No Rash: No Rubor: No Scarring: Yes Temperature / Pain Moisture Temperature: No Abnormality No Abnormalities Noted: No Tenderness on Palpation: Yes Dry / Scaly: No Maceration: No Moist: Yes Wound Preparation Ulcer Cleansing: Other: soap and water, Topical Anesthetic Applied: Other: lidocaine 4%, Treatment Notes Wound #2 (Right, Medial Malleolus) 1. Cleansed with: Cleanse wound with antibacterial soap and water 2. Anesthetic Topical Lidocaine 4% cream to wound bed prior to debridement 3. Peri-wound Care: Barrier cream Moisturizing lotion Other peri-wound care (specify in  notes) 4. Dressing Applied: Santyl Ointment Medihoney Gel 5. Secondary Dressing Applied ABD Pad Emberson, Zakk C. ([id number]) Dry Gauze 7. Secured with Tape 4-Layer Compression System - Right Lower Extremity Notes xtrasorb, drawtex, TCA Electronic Signature(s) Signed: 05/01/2016 4:41:03 PM By: [name] Entered By: [name] on 05/01/2016 08:18:52 [name] Stack ([id number]) -------------------------------------------------------------------------------- Vitals Details [name] Ringer Date of Service: 05/01/2016 8:00 AM Patient Name: C. Patient Account Number: [id number] Medical Record Treating RN: [name] Haggis [id number] Number: Other Clinician: Date of Birth/Sex: 03-Mar-1946 (70 y.o. Male) Treating [name], [name] Primary Care [name]: [name]/Extender: [name]: [name] Weeks in Treatment: 15 Vital Signs Time Taken: 08:21 Temperature (F): 98.2 Height (in): 76 Pulse (bpm): 79 Weight (lbs): 238 Respiratory Rate (breaths/min): 18 Body Mass Index (BMI): 29 Blood Pressure (mmHg): 135/65 Reference Range: 80 - 120 mg / dl Electronic Signature(s) Signed: 05/01/2016 4:41:03 PM By: [name] Entered By: [name] on 05/01/2016 08:22:02

## 2016-05-04 DIAGNOSIS — I87331 Chronic venous hypertension (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: Secondary | ICD-10-CM | POA: Diagnosis not present

## 2016-05-05 NOTE — Progress Notes (Signed)
BRAXDON, GAPPA ([id number]) Visit Report for 05/04/2016 Arrival Information Details Patient Name: [name], [name]. Date of Service: 05/04/2016 8:00 AM Medical Record Number: [id number] Patient Account Number: [id number] Date of Birth/Sex: 1945/10/04 (70 y.o. Male) Treating RN: Curtis Sites Primary Care Physician: Other Clinician: Referring Physician: SELF, REFERRED Treating Physician/Extender: Rudene Re in Treatment: 15 Visit Information History Since Last Visit Added or deleted any medications: No Patient Arrived: Ambulatory Any new allergies or adverse reactions: No Arrival Time: 08:07 Had a fall or experienced change in No Accompanied By: self activities of daily living that may affect Transfer Assistance: None risk of falls: Patient Identification Verified: Yes Signs or symptoms of abuse/neglect since last No Secondary Verification Process Yes visito Completed: Hospitalized since last visit: No Patient Requires Transmission- No Pain Present Now: No Based Precautions: Patient Has Alerts: Yes Patient Alerts: Patient on Blood Thinner Electronic Signature(s) Signed: 05/04/2016 4:21:46 PM By: Curtis Sites Entered By: Curtis Sites on 05/04/2016 08:11:26 [name] ([id number]) -------------------------------------------------------------------------------- Encounter Discharge Information Details Patient Name: [name] Date of Service: 05/04/2016 8:00 AM Medical Record Number: [id number] Patient Account Number: [id number] Date of Birth/Sex: [birth year] (70 y.o. Male) Treating RN: Curtis Sites Primary Care Physician: Other Clinician: Referring Physician: SELF, REFERRED Treating Physician/Extender: Rudene Re in Treatment: 15 Encounter Discharge Information Items Discharge Pain Level: 0 Discharge Condition: Stable Ambulatory Status: Ambulatory Discharge Destination: Home Private Transportation: Auto Accompanied By:  self Schedule Follow-up Appointment: Yes Medication Reconciliation completed and No provided to Patient/Care Karl Erway: Clinical Summary of Care: Electronic Signature(s) Signed: 05/04/2016 9:38:07 AM By: Curtis Sites Entered By: Curtis Sites on 05/04/2016 09:38:06 [name] ([id number]) -------------------------------------------------------------------------------- Patient/Caregiver Education Details Patient Name: [name] Date of Service: 05/04/2016 8:00 AM Medical Record Number: [id number] Patient Account Number: [id number] Date of Birth/Gender: Nov 03, 1945 (70 y.o. Male) Treating RN: Curtis Sites Primary Care Physician: Other Clinician: Referring Physician: SELF, REFERRED Treating Physician/Extender: Rudene Re in Treatment: 15 Education Assessment Education Provided To: Patient Education Topics Provided Venous: Handouts: Other: leg elevation Methods: Demonstration, Explain/Verbal Responses: State content correctly Electronic Signature(s) Signed: 05/04/2016 4:21:46 PM By: Curtis Sites Entered By: Curtis Sites on 05/04/2016 09:37:51 [name] ([id number]) -------------------------------------------------------------------------------- Wound Assessment Details Patient Name: [name] Date of Service: 05/04/2016 8:00 AM Medical Record Number: [id number] Patient Account Number: [id number] Date of Birth/Sex: 02-03-1946 (70 y.o. Male) Treating RN: Curtis Sites Primary Care Physician: Other Clinician: Referring Physician: SELF, REFERRED Treating Physician/Extender: Rudene Re in Treatment: 15 Wound Status Wound Number: 1 Primary Venous Leg Ulcer Etiology: Wound Location: Right Malleolus - Lateral Wound Open Wounding Event: Gradually Appeared Status: Date Acquired: 08/11/2015 Comorbid Cataracts, Chronic Obstructive Weeks Of Treatment: 15 History: Pulmonary Disease (COPD), Clustered Wound:  No Osteoarthritis Photos Wound Measurements Length: (cm) 5.6 Width: (cm) 8 Depth: (cm) 0.2 Area: (cm) 35.186 Volume: (cm) 7.037 % Reduction in Area: -103.6% % Reduction in Volume: -35.7% Epithelialization: None Tunneling: No Undermining: No Wound Description Full Thickness Without Exposed Classification: Support Structures Wound Margin: Thickened Exudate Large Amount: Exudate Type: Serosanguineous Exudate Color: red, brown Foul Odor After Cleansing: No Wound Bed Granulation Amount: Small (1-33%) Exposed Structure Granulation Quality: Pink Fascia Exposed: No Necrotic Amount: Large (67-100%) Fat Layer Exposed: No Salsbury, Karis C. ([id number]) Necrotic Quality: Adherent Slough Tendon Exposed: No Muscle Exposed: No Joint Exposed: No Bone Exposed: No Limited to Skin Breakdown Periwound Skin Texture Texture Color No Abnormalities Noted: No No Abnormalities Noted: No Callus: No Atrophie Blanche: No Crepitus: No Cyanosis: No Excoriation: No  Ecchymosis: No Fluctuance: No Erythema: No Friable: No Hemosiderin Staining: No Induration: No Mottled: No Localized Edema: No Pallor: No Rash: No Rubor: No Scarring: Yes Temperature / Pain Moisture Temperature: No Abnormality No Abnormalities Noted: No Tenderness on Palpation: Yes Dry / Scaly: No Maceration: No Moist: Yes Wound Preparation Ulcer Cleansing: Other: soap and water, Topical Anesthetic Applied: Other: lidocaine 4%, Treatment Notes Wound #1 (Right, Lateral Malleolus) 1. Cleansed with: Cleanse wound with antibacterial soap and water 3. Peri-wound Care: Barrier cream Moisturizing lotion Other peri-wound care (specify in notes) 4. Dressing Applied: Santyl Ointment Medihoney Gel 5. Secondary Dressing Applied ABD Pad Dry Gauze 7. Secured with Tape 4-Layer Compression System - Right Lower Extremity Notes [name] StackBLACKWELL, Ronelle C. ([id number]) xtrasorb, drawtex, TCA Electronic  Signature(s) Signed: 05/04/2016 4:21:46 PM By: Curtis Sitesorthy, Joanna Entered By: Curtis Sitesorthy, Joanna on 05/04/2016 08:36:36 [name] StackBLACKWELL, De C. ([id number]) -------------------------------------------------------------------------------- Wound Assessment Details Patient Name: [name] StackBLACKWELL, Rondall C. Date of Service: 05/04/2016 8:00 AM Medical Record Number: [id number] Patient Account Number: [id number] Date of Birth/Sex: [birth year](70 y.o. Male) Treating RN: Curtis Sitesorthy, Joanna Primary Care Physician: Other Clinician: Referring Physician: SELF, REFERRED Treating Physician/Extender: Rudene ReBritto, Errol Weeks in Treatment: 15 Wound Status Wound Number: 2 Primary Venous Leg Ulcer Etiology: Wound Location: Right Malleolus - Medial Wound Open Wounding Event: Gradually Appeared Status: Date Acquired: 01/30/2016 Comorbid Cataracts, Chronic Obstructive Weeks Of Treatment: 13 History: Pulmonary Disease (COPD), Clustered Wound: Yes Osteoarthritis Photos Wound Measurements Length: (cm) 7 Width: (cm) 4 Depth: (cm) 0.2 Area: (cm) 21.991 Volume: (cm) 4.398 % Reduction in Area: -90.5% % Reduction in Volume: -280.8% Epithelialization: None Tunneling: No Undermining: No Wound Description Full Thickness Without Exposed Classification: Support Structures Wound Margin: Distinct, outline attached Exudate Large Amount: Exudate Type: Serosanguineous Exudate Color: red, brown Foul Odor After Cleansing: No Wound Bed Granulation Amount: Small (1-33%) Exposed Structure Granulation Quality: Pink Fascia Exposed: No Necrotic Amount: Large (67-100%) Fat Layer Exposed: No Funnell, Cristino C. ([id number]) Necrotic Quality: Adherent Slough Tendon Exposed: No Muscle Exposed: No Joint Exposed: No Bone Exposed: No Limited to Skin Breakdown Periwound Skin Texture Texture Color No Abnormalities Noted: No No Abnormalities Noted: No Callus: No Atrophie Blanche: No Crepitus: No Cyanosis: No Excoriation:  No Ecchymosis: No Fluctuance: No Erythema: No Friable: No Hemosiderin Staining: No Induration: No Mottled: No Localized Edema: No Pallor: No Rash: No Rubor: No Scarring: Yes Temperature / Pain Moisture Temperature: No Abnormality No Abnormalities Noted: No Tenderness on Palpation: Yes Dry / Scaly: No Maceration: No Moist: Yes Wound Preparation Ulcer Cleansing: Other: soap and water, Topical Anesthetic Applied: Other: lidocaine 4%, Treatment Notes Wound #2 (Right, Medial Malleolus) 1. Cleansed with: Cleanse wound with antibacterial soap and water 3. Peri-wound Care: Barrier cream Moisturizing lotion Other peri-wound care (specify in notes) 4. Dressing Applied: Santyl Ointment Medihoney Gel 5. Secondary Dressing Applied ABD Pad Dry Gauze 7. Secured with Tape 4-Layer Compression System - Right Lower Extremity Notes [name] StackBLACKWELL, Dejuan C. ([id number]) xtrasorb, drawtex, TCA Electronic Signature(s) Signed: 05/04/2016 4:21:46 PM By: Curtis Sitesorthy, Joanna Entered By: Curtis Sitesorthy, Joanna on 05/04/2016 08:36:55

## 2016-05-08 ENCOUNTER — Encounter: Payer: Medicare HMO | Admitting: Internal Medicine

## 2016-05-08 DIAGNOSIS — I87331 Chronic venous hypertension (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: Secondary | ICD-10-CM | POA: Diagnosis not present

## 2016-05-08 NOTE — Progress Notes (Signed)
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 Dressing: Wound #1 Right,Lateral Malleolus: ABD pad Dry Gauze XtraSorb Wound #2 Right,Medial Malleolus: ABD pad Dry Gauze XtraSorb Dressing Change Frequency: Wound #1 Right,Lateral Malleolus: Change dressing every week Wound #2 Right,Medial Malleolus: Change dressing every week Follow-up Appointments: Wound #1 Right,Lateral Malleolus: Return Appointment in 1 week. [name] StackBLACKWELL, Berle C. ([id number]) Wound #2 Right,Medial Malleolus: Return Appointment in 1 week. Edema Control: Wound #1 Right,Lateral Malleolus: 4-Layer Compression System - Right Lower Extremity - UNNA TO ANCHOR Wound #2 Right,Medial Malleolus: 4-Layer Compression System - Right Lower Extremity - UNNA TO ANCHOR Additional Orders / Instructions: Wound #1 Right,Lateral Malleolus: Increase protein intake. OK to return to work with the following restrictions: Activity as tolerated Wound #2 Right,Medial Malleolus: Increase protein intake. OK to return to work with the following restrictions: Activity as tolerated o #1 as per our continued plan I changed Hydrofera Blue today. I am hopeful to get some closure over the surface #2 and some thoughts about an advanced treatment dressing especially on the lateral aspect of this wound however  I'm not sure if he has the insurance to cover this. May be something we can look into starting next week #3 in spite of the drainage reports I see no evidence of infection in either site Electronic Signature(s) Signed: 05/08/2016 4:19:04 PM By: [name] [name] Entered By: [name] [name] on 05/08/2016 08:33:59 [name] [name] ([id number]) -------------------------------------------------------------------------------- SuperBill Details [name] [name] Date of Service: 05/08/2016 Patient Name: C. Patient Account Number:  [id number] Medical Record Treating [name]: [name] [name] [id number] Number: Other Clinician: Date of Birth/Sex: 03-May-1946 (70 y.o. 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 Facility Procedures CPT4: Description Modifier Quantity Code 78295621 11042 - DEB SUBQ TISSUE 20 SQ CM/< 1 ICD-10 Description Diagnosis I87.331 Chronic venous hypertension (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 CPT4: 30865784 11045 - DEB SUBQ TISS EA ADDL 20CM 4 ICD-10 Description Diagnosis I87.331 Chronic venous hypertension (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 Physician Procedures CPT4: Description Modifier Quantity Code 6962952 11042 - WC PHYS SUBQ TISS 20 SQ CM 1 ICD-10 Description Diagnosis I87.331 Chronic venous hypertension (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 CPT4: 8413244 11045 - WC PHYS SUBQ TISS EA ADDL 20 CM 4 BRYCEN, BEAN ([id number]) Electronic Signature(s) Signed: 05/08/2016 4:19:04 PM By: [name] [name] Entered By: [name] [name] on 05/08/2016 08:34:48

## 2016-05-08 NOTE — Progress Notes (Signed)
[name], [name] ([id number]) Visit Report for 05/08/2016 Arrival Information Details [name], [name] Date of Service: 05/08/2016 8:00 AM Patient Name: C. Patient Account Number: [id number] Medical Record Treating RN: [name], [name] Number: Other Clinician: Date of Birth/Sex: [birth year](70 y.o. Male) Treating [name] Primary Care Physician: Physician/Extender: G Referring Physician: SELF, REFERRED Weeks in Treatment: 16 Visit Information History Since Last Visit Added or deleted any medications: No Patient Arrived: Ambulatory Any new allergies or adverse reactions: No Arrival Time: 08:05 Had a fall or experienced change in No Accompanied By: self activities of daily living that may affect Transfer Assistance: None risk of falls: Patient Identification Verified: Yes Signs or symptoms of abuse/neglect since last No Secondary Verification Process Yes visito Completed: Hospitalized since last visit: No Patient Requires Transmission- No Has Dressing in Place as Prescribed: Yes Based Precautions: Pain Present Now: No Patient Has Alerts: Yes Patient Alerts: Patient on Blood Thinner Electronic Signature(s) Signed: 05/08/2016 5:05:22 PM By: Elliot GurneyWoody, RN, BSN, Kim RN, BSN Entered By: Elliot GurneyWoody, RN, BSN, [name] on 05/08/2016 08:05:56 [name], [name] ([id number]) -------------------------------------------------------------------------------- Encounter Discharge Information Details [name], [name] Date of Service: 05/08/2016 8:00 AM Patient Name: C. Patient Account Number: [id number] Medical Record Treating RN: [name], [name] [id number] Number: Other Clinician: Date of Birth/Sex: [birth year](70 y.o. Male) Treating [name] Primary Care Physician: Physician/Extender: G Referring Physician: SELF, REFERRED Weeks in Treatment: 16 Encounter Discharge Information Items Discharge Pain Level: 0 Discharge Condition: Stable Ambulatory Status: Ambulatory Discharge  Destination: Home Transportation: Private Auto Accompanied By: self Schedule Follow-up Appointment: Yes Medication Reconciliation completed Yes and provided to Patient/Care Rosamae Rocque: Patient Clinical Summary of Care: Declined Electronic Signature(s) Signed: 05/08/2016 1:39:16 PM By: Gwenlyn PerkingMoore, Shelia Entered By: Gwenlyn PerkingMoore, Shelia on 05/08/2016 13:39:16 [name], [name] ([id number]) -------------------------------------------------------------------------------- Lower Extremity Assessment Details [name], [name] Date of Service: 05/08/2016 8:00 AM Patient Name: C. Patient Account Number: [id number] Medical Record Treating RN: [name], [name] [id number] Number: Other Clinician: Date of Birth/Sex: [birth year](70 y.o. Male) Treating [name] Primary Care Physician: Physician/Extender: G Referring Physician: SELF, REFERRED Weeks in Treatment: 16 Edema Assessment Assessed: [Left: No] [Right: No] E[Left: dema] [Right: :] Calf Left: Right: Point of Measurement: 40 cm From Medial Instep cm 35.5 cm Ankle Left: Right: Point of Measurement: 12 cm From Medial Instep cm 25.6 cm Vascular Assessment Pulses: Posterior Tibial Dorsalis Pedis Palpable: [Right:Yes] Extremity colors, hair growth, and conditions: Extremity Color: [Right:Hyperpigmented] Hair Growth on Extremity: [Right:No] Temperature of Extremity: [Right:Warm] Capillary Refill: [Right:< 3 seconds] Dependent Rubor: [Right:No] Blanched when Elevated: [Right:No] Lipodermatosclerosis: [Right:No] Toe Nail Assessment Left: Right: Thick: Yes Discolored: Yes Deformed: Yes Improper Length and Hygiene: Yes Electronic Signature(s) [name], [name] ([id number]) Signed: 05/08/2016 5:05:22 PM By: Elliot GurneyWoody, RN, BSN, Kim RN, BSN Entered By: Elliot GurneyWoody, RN, BSN, [name] on 05/08/2016 08:13:26 [name], Bland C. ([id number]) -------------------------------------------------------------------------------- Multi Wound Chart Details [name],  [name] Date of Service: 05/08/2016 8:00 AM Patient Name: C. Patient Account Number: [id number] Medical Record Treating RN: [name], [name] [id number] Number: Other Clinician: Date of Birth/Sex: [birth year] (70 y.o. Male) Treating [name] Primary Care Physician: Physician/Extender: G Referring Physician: SELF, REFERRED Weeks in Treatment: 16 Vital Signs Height(in): 76 Pulse(bpm): 77 Weight(lbs): 238 Blood Pressure 151/64 (mmHg): Body Mass Index(BMI): 29 Temperature(F): 98.2 Respiratory Rate 16 (breaths/min): Photos: [N/A:N/A] Wound Location: Right Malleolus - Lateral Right Malleolus - Medial N/A Wounding Event: Gradually Appeared Gradually Appeared N/A Primary Etiology: Venous Leg Ulcer Venous Leg Ulcer N/A Comorbid History: Cataracts, Chronic Cataracts, Chronic N/A Obstructive Pulmonary Obstructive Pulmonary Disease (COPD), Disease (COPD), Osteoarthritis  Osteoarthritis Date Acquired: 08/11/2015 01/30/2016 N/A Weeks of Treatment: 16 14 N/A Wound Status: Open Open N/A Clustered Wound: No Yes N/A Measurements L x W x D 7x7.5x0.2 7x4.5x0.2 N/A (cm) Area (cm) : 41.233 24.74 N/A Volume (cm) : 8.247 4.948 N/A % Reduction in Area: -138.60% -114.30% N/A % Reduction in Volume: -59.10% -328.40% N/A Classification: Full Thickness Without Full Thickness Without N/A Exposed Support Exposed Support Structures Structures Exudate Amount: Large Large N/A [name]. ([id number]) Exudate Type: Serosanguineous Serosanguineous N/A Exudate Color: red, brown red, brown N/A Wound Margin: Thickened Distinct, outline attached N/A Granulation Amount: Small (1-33%) Small (1-33%) N/A Granulation Quality: Pink Pink N/A Necrotic Amount: Large (67-100%) Large (67-100%) N/A Exposed Structures: Fascia: No Fascia: No N/A Fat: No Fat: No Tendon: No Tendon: No Muscle: No Muscle: No Joint: No Joint: No Bone: No Bone: No Limited to Skin Limited to Skin Breakdown  Breakdown Epithelialization: None None N/A Periwound Skin Texture: Scarring: Yes Scarring: Yes N/A Edema: No Edema: No Excoriation: No Excoriation: No Induration: No Induration: No Callus: No Callus: No Crepitus: No Crepitus: No Fluctuance: No Fluctuance: No Friable: No Friable: No Rash: No Rash: No Periwound Skin Maceration: Yes Maceration: Yes N/A Moisture: Moist: Yes Moist: Yes Dry/Scaly: No Dry/Scaly: No Periwound Skin Color: Atrophie Blanche: No Atrophie Blanche: No N/A Cyanosis: No Cyanosis: No Ecchymosis: No Ecchymosis: No Erythema: No Erythema: No Hemosiderin Staining: No Hemosiderin Staining: No Mottled: No Mottled: No Pallor: No Pallor: No Rubor: No Rubor: No Temperature: No Abnormality No Abnormality N/A Tenderness on Yes Yes N/A Palpation: Wound Preparation: Ulcer Cleansing: Other: Ulcer Cleansing: Other: N/A soap and water soap and water Topical Anesthetic Topical Anesthetic Applied: Other: lidocaine Applied: Other: lidocaine 4% 4% Treatment Notes Electronic Signature(s) Signed: 05/08/2016 5:05:22 PM By: Elliot Gurney, RN, BSN, Kim RN, BSN [address], [name] ([id number]) Entered By: Elliot Gurney, RN, BSN, [name] on 05/08/2016 08:20:19 [name] Stack ([id number]) -------------------------------------------------------------------------------- Multi-Disciplinary Care Plan Details [name] Ringer Date of Service: 05/08/2016 8:00 AM Patient Name: C. Patient Account Number: [id number] Medical Record Treating RN: [name] Coventry [id number] Number: Other Clinician: Date of Birth/Sex: 03-31-1946 (70 y.o. Male) Treating [name] Primary Care Physician: Physician/Extender: G Referring Physician: SELF, REFERRED Weeks in Treatment: 16 Active Inactive Venous Leg Ulcer Nursing Diagnoses: Knowledge deficit related to disease process and management Goals: Patient will maintain optimal edema control Date Initiated: 04/03/2016 Goal Status:  Active Interventions: Compression as ordered Treatment Activities: Therapeutic compression applied : 04/03/2016 Notes: Wound/Skin Impairment Nursing Diagnoses: Impaired tissue integrity Goals: Ulcer/skin breakdown will heal within 14 weeks Date Initiated: 01/17/2016 Goal Status: Active Interventions: Assess ulceration(s) every visit Notes: Electronic Signature(s) Signed: 05/08/2016 5:05:22 PM By: Elliot Gurney, RN, BSN, Kim RN, BSN [address], [name] ([id number]) Entered By: Elliot Gurney, RN, BSN, [name] on 05/08/2016 08:17:57 [name] Stack ([id number]) -------------------------------------------------------------------------------- Pain Assessment Details [name] Ringer Date of Service: 05/08/2016 8:00 AM Patient Name: C. Patient Account Number: [id number] Medical Record Treating RN: [name] Coventry [id number] Number: Other Clinician: Date of Birth/Sex: 09-30-1945 (71 y.o. Male) Treating [name] Primary Care Physician: Physician/Extender: G Referring Physician: SELF, REFERRED Weeks in Treatment: 16 Active Problems Location of Pain Severity and Description of Pain Patient Has Paino No Site Locations With Dressing Change: No Pain Management and Medication Current Pain Management: Electronic Signature(s) Signed: 05/08/2016 5:05:22 PM By: Elliot Gurney, RN, BSN, Kim RN, BSN Entered By: Elliot Gurney, RN, BSN, [name] on 05/08/2016 08:06:03 [name] Stack ([id number]) -------------------------------------------------------------------------------- Patient/Caregiver Education Details [name] Ringer Date of Service: 05/08/2016 8:00 AM Patient Name: C. Patient Account Number:  [id number] Medical Record Treating RN: [name] Coventry [id number] Number: Other Clinician: Date of Birth/Gender: 1946/03/20 (70 y.o. Male) Treating [name] Primary Care Physician: Physician/Extender: G Referring Physician: SELF, REFERRED Weeks in Treatment: 16 Education Assessment Education Provided  To: Patient Education Topics Provided Wound/Skin Impairment: Handouts: Other: wound care as prescribed Methods: Demonstration Responses: State content correctly Electronic Signature(s) Signed: 05/08/2016 5:05:22 PM By: Elliot Gurney, RN, BSN, Kim RN, BSN Entered By: Elliot Gurney, RN, BSN, [name] on 05/08/2016 08:45:31 [name] Stack ([id number]) -------------------------------------------------------------------------------- Wound Assessment Details [name] Ringer Date of Service: 05/08/2016 8:00 AM Patient Name: C. Patient Account Number: [id number] Medical Record Treating RN: [name] Coventry [id number] Number: Other Clinician: Date of Birth/Sex: [birth year] (70 y.o. Male) Treating [name] Primary Care Physician: Physician/Extender: G Referring Physician: SELF, REFERRED Weeks in Treatment: 16 Wound Status Wound Number: 1 Primary Venous Leg Ulcer Etiology: Wound Location: Right Malleolus - Lateral Wound Open Wounding Event: Gradually Appeared Status: Date Acquired: 08/11/2015 Comorbid Cataracts, Chronic Obstructive Weeks Of Treatment: 16 History: Pulmonary Disease (COPD), Clustered Wound: No Osteoarthritis Photos Wound Measurements Length: (cm) 7 Width: (cm) 7.5 Depth: (cm) 0.2 Area: (cm) 41.233 Volume: (cm) 8.247 % Reduction in Area: -138.6% % Reduction in Volume: -59.1% Epithelialization: None Tunneling: No Undermining: No Wound Description Full Thickness Without Exposed Classification: Support Structures Wound Margin: Thickened Exudate Large Amount: Exudate Type: Serosanguineous Exudate Color: red, brown Foul Odor After Cleansing: No Wound Bed Granulation Amount: Small (1-33%) Exposed Structure Navarrete, Mahkai C. ([id number]) Granulation Quality: Pink Fascia Exposed: No Necrotic Amount: Large (67-100%) Fat Layer Exposed: No Necrotic Quality: Adherent Slough Tendon Exposed: No Muscle Exposed: No Joint Exposed: No Bone Exposed: No Limited to Skin  Breakdown Periwound Skin Texture Texture Color No Abnormalities Noted: No No Abnormalities Noted: No Callus: No Atrophie Blanche: No Crepitus: No Cyanosis: No Excoriation: No Ecchymosis: No Fluctuance: No Erythema: No Friable: No Hemosiderin Staining: No Induration: No Mottled: No Localized Edema: No Pallor: No Rash: No Rubor: No Scarring: Yes Temperature / Pain Moisture Temperature: No Abnormality No Abnormalities Noted: No Tenderness on Palpation: Yes Dry / Scaly: No Maceration: Yes Moist: Yes Wound Preparation Ulcer Cleansing: Other: soap and water, Topical Anesthetic Applied: Other: lidocaine 4%, Treatment Notes Wound #1 (Right, Lateral Malleolus) 1. Cleansed with: Cleanse wound with antibacterial soap and water 2. Anesthetic Topical Lidocaine 4% cream to wound bed prior to debridement 3. Peri-wound Care: Barrier cream 4. Dressing Applied: Hydrafera Blue 5. Secondary Dressing Applied ABD Pad 6. Footwear/Offloading device applied Surgical shoe 7. Secured with 4-Layer Compression System - Right Lower Extremity ABID, BOLLA ([id number]) Notes large x-sorb Electronic Signature(s) Signed: 05/08/2016 5:05:22 PM By: Elliot Gurney, RN, BSN, Kim RN, BSN Entered By: Elliot Gurney, RN, BSN, [name] on 05/08/2016 08:17:03 [name] Stack ([id number]) -------------------------------------------------------------------------------- Wound Assessment Details [name] Ringer Date of Service: 05/08/2016 8:00 AM Patient Name: C. Patient Account Number: [id number] Medical Record Treating RN: [name] Coventry [id number] Number: Other Clinician: Date of Birth/Sex: [birth year] (70 y.o. Male) Treating [name] Primary Care Physician: Physician/Extender: G Referring Physician: SELF, REFERRED Weeks in Treatment: 16 Wound Status Wound Number: 2 Primary Venous Leg Ulcer Etiology: Wound Location: Right Malleolus - Medial Wound Open Wounding Event: Gradually  Appeared Status: Date Acquired: 01/30/2016 Comorbid Cataracts, Chronic Obstructive Weeks Of Treatment: 14 History: Pulmonary Disease (COPD), Clustered Wound: Yes Osteoarthritis Photos Wound Measurements Length: (cm) 7 Width: (cm) 4.5 Depth: (cm) 0.2 Area: (cm) 24.74 Volume: (cm) 4.948 % Reduction in Area: -114.3% % Reduction in Volume: -328.4% Epithelialization: None Wound Description Full Thickness Without Exposed Classification: Support  Structures Wound Margin: Distinct, outline attached Exudate Large Amount: Exudate Type: Serosanguineous Exudate Color: red, brown Foul Odor After Cleansing: No Wound Bed Granulation Amount: Small (1-33%) Exposed Structure Geno, Jimie C. ([id number]) Granulation Quality: Pink Fascia Exposed: No Necrotic Amount: Large (67-100%) Fat Layer Exposed: No Necrotic Quality: Adherent Slough Tendon Exposed: No Muscle Exposed: No Joint Exposed: No Bone Exposed: No Limited to Skin Breakdown Periwound Skin Texture Texture Color No Abnormalities Noted: No No Abnormalities Noted: No Callus: No Atrophie Blanche: No Crepitus: No Cyanosis: No Excoriation: No Ecchymosis: No Fluctuance: No Erythema: No Friable: No Hemosiderin Staining: No Induration: No Mottled: No Localized Edema: No Pallor: No Rash: No Rubor: No Scarring: Yes Temperature / Pain Moisture Temperature: No Abnormality No Abnormalities Noted: No Tenderness on Palpation: Yes Dry / Scaly: No Maceration: Yes Moist: Yes Wound Preparation Ulcer Cleansing: Other: soap and water, Topical Anesthetic Applied: Other: lidocaine 4%, Treatment Notes Wound #2 (Right, Medial Malleolus) 1. Cleansed with: Cleanse wound with antibacterial soap and water 2. Anesthetic Topical Lidocaine 4% cream to wound bed prior to debridement 3. Peri-wound Care: Barrier cream 4. Dressing Applied: Hydrafera Blue 5. Secondary Dressing Applied ABD Pad 6. Footwear/Offloading device  applied Surgical shoe 7. Secured with 4-Layer Compression System - Right Lower Extremity KHYREN, HING ([id number]) Notes large x-sorb Electronic Signature(s) Signed: 05/08/2016 5:05:22 PM By: Elliot Gurney, RN, BSN, Kim RN, BSN Entered By: Elliot Gurney, RN, BSN, [name] on 05/08/2016 08:17:40 [name] Stack ([id number]) -------------------------------------------------------------------------------- Vitals Details [name] Ringer Date of Service: 05/08/2016 8:00 AM Patient Name: C. Patient Account Number: [id number] Medical Record Treating RN: [name] Coventry [id number] Number: Other Clinician: Date of Birth/Sex: [birth year] (70 y.o. Male) Treating [name] Primary Care Physician: Physician/Extender: G Referring Physician: SELF, REFERRED Weeks in Treatment: 16 Vital Signs Time Taken: 08:06 Temperature (F): 98.2 Height (in): 76 Pulse (bpm): 77 Weight (lbs): 238 Respiratory Rate (breaths/min): 16 Body Mass Index (BMI): 29 Blood Pressure (mmHg): 151/64 Reference Range: 80 - 120 mg / dl Electronic Signature(s) Signed: 05/08/2016 5:05:22 PM By: Elliot Gurney, RN, BSN, Kim RN, BSN Entered By: Elliot Gurney, RN, BSN, [name] on 05/08/2016 27:25:36

## 2016-05-11 DIAGNOSIS — I87331 Chronic venous hypertension (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: Secondary | ICD-10-CM | POA: Diagnosis not present

## 2016-05-12 NOTE — Progress Notes (Signed)
[name], Welles C. ([id number]) Visit Report for 05/11/2016 Arrival Information Details Patient Name: [name], Martavius C. Date of Service: 05/11/2016 9:30 AM Medical Record Number: [id number] Patient Account Number: [id number] Date of Birth/Sex: Apr 10, 1946 [age] y.o. Male) Treating RN: Clover MealyAfful, RN, BSN, [place] Sinkita Primary Care Physician: Other Clinician: Referring Physician: SELF, REFERRED Treating Physician/Extender: Rudene ReBritto, Errol Weeks in Treatment: 16 Visit Information History Since Last Visit All ordered tests and consults were completed: No Patient Arrived: Ambulatory Added or deleted any medications: No Arrival Time: 09:30 Any new allergies or adverse reactions: No Accompanied By: self Had a fall or experienced change in No Transfer Assistance: None activities of daily living that may affect Patient Identification Verified: Yes risk of falls: Secondary Verification Process Yes Signs or symptoms of abuse/neglect since last No Completed: visito Patient Requires Transmission- No Has Dressing in Place as Prescribed: Yes Based Precautions: Pain Present Now: No Patient Has Alerts: Yes Patient Alerts: Patient on Blood Thinner Electronic Signature(s) Signed: 05/11/2016 4:12:23 PM By: Elpidio EricAfful, Rita BSN, RN Entered By: Elpidio EricAfful, Rita on 05/11/2016 09:42:49 [name], Nathian C. ([id number]) -------------------------------------------------------------------------------- Encounter Discharge Information Details Patient Name: [name], Imani C. Date of Service: 05/11/2016 9:30 AM Medical Record Number: [id number] Patient Account Number: [id number] Date of Birth/Sex: Apr 10, 1946 [age] y.o. Male) Treating RN: Clover MealyAfful, RN, BSN, Vivian Sinkita Primary Care Physician: Other Clinician: Referring Physician: SELF, REFERRED Treating Physician/Extender: Rudene ReBritto, Errol Weeks in Treatment: 16 Encounter Discharge Information Items Discharge Pain Level: 0 Discharge Condition: Stable Ambulatory Status:  Ambulatory Discharge Destination: Home Private Transportation: Auto Accompanied By: self Schedule Follow-up Appointment: No Medication Reconciliation completed and No provided to Patient/Care Raine Blodgett: Clinical Summary of Care: Electronic Signature(s) Signed: 05/11/2016 4:12:23 PM By: Elpidio EricAfful, Rita BSN, RN Entered By: Elpidio EricAfful, Rita on 05/11/2016 09:43:36 [name], Deavion C. ([id number]) -------------------------------------------------------------------------------- Patient/Caregiver Education Details Patient Name: [name], Dayon C. Date of Service: 05/11/2016 9:30 AM Medical Record Number: [id number] Patient Account Number: [id number] Date of Birth/Gender: Apr 10, 1946 [age] y.o. Male) Treating RN: Clover MealyAfful, RN, BSN, [place] Sinkita Primary Care Physician: Other Clinician: Referring Physician: SELF, REFERRED Treating Physician/Extender: Rudene ReBritto, Errol Weeks in Treatment: 16 Education Assessment Education Provided To: Patient Education Topics Provided Wound/Skin Impairment: Methods: Explain/Verbal Responses: State content correctly Electronic Signature(s) Signed: 05/11/2016 4:12:23 PM By: Elpidio EricAfful, Rita BSN, RN Entered By: Elpidio EricAfful, Rita on 05/11/2016 09:43:25

## 2016-05-15 ENCOUNTER — Encounter: Payer: Medicare HMO | Admitting: Internal Medicine

## 2016-05-15 DIAGNOSIS — I87331 Chronic venous hypertension (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: Secondary | ICD-10-CM | POA: Diagnosis not present

## 2016-05-18 DIAGNOSIS — I87331 Chronic venous hypertension (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: Secondary | ICD-10-CM | POA: Diagnosis not present

## 2016-05-18 NOTE — Progress Notes (Addendum)
[name], [place] C. ([id number]) Visit Report for 05/18/2016 Arrival Information Details Patient Name: [name], [name] Date of Service: 05/18/2016 8:00 AM Medical Record Number: [id number] Patient Account Number: [id number] Date of Birth/Sex: 01/11/1946 6(70 y.o. Male) Treating RN: Clover MealyAfful, RN, BSN, [place] Primary Care Physician: Other Clinician: Referring Physician: SELF, REFERRED Treating Physician/Extender: Rudene ReBritto, Errol Weeks in Treatment: 17 Visit Information History Since Last Visit All ordered tests and consults were completed: No Patient Arrived: Ambulatory Any new allergies or adverse reactions: No Arrival Time: 08:02 Had a fall or experienced change in No Accompanied By: self activities of daily living that may affect Transfer Assistance: None risk of falls: Patient Identification Verified: Yes Signs or symptoms of abuse/neglect since last No Secondary Verification Process Yes visito Completed: Has Dressing in Place as Prescribed: Yes Patient Requires Transmission- No Has Compression in Place as Prescribed: Yes Based Precautions: Pain Present Now: No Patient Has Alerts: Yes Patient Alerts: Patient on Blood Thinner Electronic Signature(s) Signed: 05/18/2016 8:02:44 AM By: Elpidio EricAfful, Rita BSN, RN Entered By: Elpidio EricAfful, Rita on 05/18/2016 08:02:44 [name], [name] ([id number]) -------------------------------------------------------------------------------- Encounter Discharge Information Details Patient Name: [name], [name] Date of Service: 05/18/2016 8:00 AM Medical Record Number: [id number] Patient Account Number: [id number] Date of Birth/Sex: 01/11/1946 [age] y.o. Male) Treating RN: Clover MealyAfful, RN, BSN, [place] S[place] Primary Care Physician: Other Clinician: Referring Physician: SELF, REFERRED Treating Physician/Extender: Rudene ReBritto, Errol Weeks in Treatment: [id number] Encounter Discharge Information Items Discharge Pain Level: 0 Discharge Condition: Stable Ambulatory Status:  Ambulatory Discharge Destination: Home Private Transportation: Auto Accompanied By: self Schedule Follow-up Appointment: No Medication Reconciliation completed and No provided to Patient/Care Tycho Cheramie: Clinical Summary of Care: Electronic Signature(s) Signed: 05/18/2016 4:34:10 PM By: Elpidio EricAfful, Rita BSN, RN Entered By: Elpidio EricAfful, Rita on 05/18/2016 08:26:34 [name], [name] ([id number]) -------------------------------------------------------------------------------- Patient/Caregiver Education Details Patient Name: [name], [name] Date of Service: 05/18/2016 8:00 AM Medical Record Number: [id number] Patient Account Number: [id number] Date of Birth/Gender: 01/11/1946 [age] y.o. Male) Treating RN: Clover MealyAfful, RN, BSN, [place] Primary Care Physician: Other Clinician: Referring Physician: SELF, REFERRED Treating Physician/Extender: Rudene ReBritto, Errol Weeks in Treatment: 17 Education Assessment Education Provided To: Patient Education Topics Provided Wound/Skin Impairment: Methods: Explain/Verbal Responses: State content correctly Electronic Signature(s) Signed: 05/18/2016 4:34:10 PM By: Elpidio EricAfful, Rita BSN, RN Entered By: Elpidio EricAfful, Rita on 05/18/2016 53:[id number]:4408:26:22

## 2016-05-19 NOTE — Progress Notes (Signed)
[name], [name] ([id number]) Visit Report for 05/15/2016 Arrival Information Details [name], [name] Date of Service: 05/15/2016 8:00 AM Patient Name: C. Patient Account Number: [id number] Medical Record Treating RN: [name] [id number] Number: Other Clinician: Date of Birth/Sex: Jun 28, 1946 (70 y.o. Male) Treating [name], [name] Primary Care Physician: Physician/Extender: G Referring Physician: SELF, REFERRED Weeks in Treatment: 17 Visit Information History Since Last Visit All ordered tests and consults were completed: No Patient Arrived: Ambulatory Added or deleted any medications: No Arrival Time: 08:07 Any new allergies or adverse reactions: No Accompanied By: wife Had a fall or experienced change in No Transfer Assistance: None activities of daily living that may affect Patient Identification Verified: Yes risk of falls: Secondary Verification Process Yes Signs or symptoms of abuse/neglect since last No Completed: visito Patient Requires Transmission- No Hospitalized since last visit: No Based Precautions: Pain Present Now: No Patient Has Alerts: Yes Patient Alerts: Patient on Blood Thinner Electronic Signature(s) Signed: 05/18/2016 5:24:41 PM By: Alejandro Mulling Entered By: Alejandro Mulling on 05/15/2016 08:08:06 [name] ([id number]) -------------------------------------------------------------------------------- Encounter Discharge Information Details [name] Date of Service: 05/15/2016 8:00 AM Patient Name: C. Patient Account Number: [id number] Medical Record Treating RN: [name] [id number] Number: Other Clinician: Date of Birth/Sex: 05/23/1946 (70 y.o. Male) Treating [name], [name] Primary Care Physician: Physician/Extender: G Referring Physician: SELF, REFERRED Weeks in Treatment: 17 Encounter Discharge Information Items Discharge Pain Level: 0 Discharge Condition: Stable Ambulatory Status: Ambulatory Discharge  Destination: Home Transportation: Private Auto Accompanied By: wife Schedule Follow-up Appointment: Yes Medication Reconciliation completed Yes and provided to Patient/Care [name]: Patient Clinical Summary of Care: Declined Electronic Signature(s) Signed: 05/15/2016 9:00:55 AM By: Gwenlyn Perking Entered By: Gwenlyn Perking on 05/15/2016 09:00:55 [name] ([id number]) -------------------------------------------------------------------------------- Lower Extremity Assessment Details [name] Date of Service: 05/15/2016 8:00 AM Patient Name: C. Patient Account Number: [id number] Medical Record Treating RN: [name] [id number] Number: Other Clinician: Date of Birth/Sex: 1945/10/20 (70 y.o. Male) Treating [name], [name] Primary Care Physician: Physician/Extender: G Referring Physician: SELF, REFERRED Weeks in Treatment: 17 Edema Assessment Assessed: [Left: No] [Right: No] E[Left: dema] [Right: :] Calf Left: Right: Point of Measurement: 40 cm From Medial Instep cm 35.4 cm Ankle Left: Right: Point of Measurement: 12 cm From Medial Instep cm 23.8 cm Vascular Assessment Pulses: Posterior Tibial Dorsalis Pedis Palpable: [Right:Yes] Extremity colors, hair growth, and conditions: Extremity Color: [Right:Hyperpigmented] Temperature of Extremity: [Right:Warm] Capillary Refill: [Right:< 3 seconds] Toe Nail Assessment Left: Right: Thick: Yes Discolored: Yes Deformed: Yes Improper Length and Hygiene: Yes Electronic Signature(s) Signed: 05/18/2016 5:24:41 PM By: Alejandro Mulling Entered By: Alejandro Mulling on 05/15/2016 08:15:52 [name] ([id number]) [name], [name] ([id number]) -------------------------------------------------------------------------------- Multi Wound Chart Details [name] Date of Service: 05/15/2016 8:00 AM Patient Name: C. Patient Account Number: [id number] Medical Record Treating RN: [name] [id number] Number: Other Clinician: Date of Birth/Sex: 11-22-1945 (70 y.o. Male) Treating [name], [name] Primary Care Physician: Physician/Extender: G Referring Physician: SELF, REFERRED Weeks in Treatment: 17 Vital Signs Height(in): 76 Pulse(bpm): 67 Weight(lbs): 238 Blood Pressure 144/78 (mmHg): Body Mass Index(BMI): 29 Temperature(F): 97.8 Respiratory Rate 18 (breaths/min): Photos: [N/A:N/A] Wound Location: Right Malleolus - Lateral Right Malleolus - Medial N/A Wounding Event: Gradually Appeared Gradually Appeared N/A Primary Etiology: Venous Leg Ulcer Venous Leg Ulcer N/A Comorbid History: Cataracts, Chronic Cataracts, Chronic N/A Obstructive Pulmonary Obstructive Pulmonary Disease (COPD), Disease (COPD), Osteoarthritis Osteoarthritis Date Acquired: 08/11/2015 01/30/2016 N/A Weeks of Treatment: 17 15 N/A Wound Status: Open Open N/A Clustered Wound: No Yes N/A Measurements L x  W x D 60.5x7.5x0.2 6.7x4.5x0.2 N/A (cm) Area (cm) : 356.374 23.68 N/A Volume (cm) : 71.275 4.736 N/A % Reduction in Area: -1962.50% -105.10% N/A % Reduction in Volume: -1274.90% -310.00% N/A Classification: Full Thickness Without Full Thickness Without N/A Exposed Support Exposed Support Structures Structures Exudate Amount: Large Large N/A [name], [name] ([id number]) Exudate Type: Serosanguineous Serosanguineous N/A Exudate Color: red, brown red, brown N/A Wound Margin: Thickened Distinct, outline attached N/A Granulation Amount: Medium (34-66%) Medium (34-66%) N/A Granulation Quality: Pink Pink N/A Necrotic Amount: Medium (34-66%) Medium (34-66%) N/A Exposed Structures: Fascia: No Fascia: No N/A Fat: No Fat: No Tendon: No Tendon: No Muscle: No Muscle: No Joint: No Joint: No Bone: No Bone: No Limited to Skin Limited to Skin Breakdown Breakdown Epithelialization: None None N/A Periwound Skin Texture: Scarring: Yes Scarring: Yes N/A Edema: No Edema:  No Excoriation: No Excoriation: No Induration: No Induration: No Callus: No Callus: No Crepitus: No Crepitus: No Fluctuance: No Fluctuance: No Friable: No Friable: No Rash: No Rash: No Periwound Skin Maceration: Yes Maceration: Yes N/A Moisture: Moist: Yes Moist: Yes Dry/Scaly: No Dry/Scaly: No Periwound Skin Color: Atrophie Blanche: No Atrophie Blanche: No N/A Cyanosis: No Cyanosis: No Ecchymosis: No Ecchymosis: No Erythema: No Erythema: No Hemosiderin Staining: No Hemosiderin Staining: No Mottled: No Mottled: No Pallor: No Pallor: No Rubor: No Rubor: No Temperature: No Abnormality No Abnormality N/A Tenderness on Yes Yes N/A Palpation: Wound Preparation: Ulcer Cleansing: Other: Ulcer Cleansing: Other: N/A soap and water soap and water Topical Anesthetic Topical Anesthetic Applied: Other: lidocaine Applied: Other: lidocaine 4% 4% Treatment Notes Electronic Signature(s) Signed: 05/18/2016 5:24:41 PM By: Aurelio JewPinkerton, Debra Zuver, [name] ([id number]) Entered By: Alejandro MullingPinkerton, Debra on 05/15/2016 08:40:27 [name], [place] C. ([id number]) -------------------------------------------------------------------------------- Multi-Disciplinary Care Plan Details [name] RingerBLACKWELL, Abrahm Date of Service: 05/15/2016 8:00 AM Patient Name: C. Patient Account Number: [id number] Medical Record Treating RN: Phillis Haggisinkerton, [name] [id number] Number: Other Clinician: Date of Birth/Sex: 31-Mar-1946 [age] y.o. Male) Treating [name], [name] Primary Care Physician: Physician/Extender: G Referring Physician: SELF, REFERRED Weeks in Treatment: 17 Active Inactive Venous Leg Ulcer Nursing Diagnoses: Knowledge deficit related to disease process and management Goals: Patient will maintain optimal edema control Date Initiated: 04/03/2016 Goal Status: Active Interventions: Compression as ordered Treatment Activities: Therapeutic compression applied : 04/03/2016 Notes: Wound/Skin  Impairment Nursing Diagnoses: Impaired tissue integrity Goals: Ulcer/skin breakdown will heal within 14 weeks Date Initiated: 01/17/2016 Goal Status: Active Interventions: Assess ulceration(s) every visit Notes: Electronic Signature(s) Signed: 05/18/2016 5:24:41 PM By: Aurelio JewPinkerton, Debra Propes, [name] Entered By: Alejandro MullingPinkerton, Debra on 05/15/2016 08:24:13 [name], Montrell C. ([id number]) -------------------------------------------------------------------------------- Pain Assessment Details [name] RingerBLACKWELL, Talis Date of Service: 05/15/2016 8:00 AM Patient Name: C. Patient Account Number: [id number] Medical Record Treating RN: Phillis Haggisinkerton, [name] [id number] Number: Other Clinician: Date of Birth/Sex: 31-Mar-1946 [age] y.o. Male) Treating [name], [name] Primary Care Physician: Physician/Extender: G Referring Physician: SELF, REFERRED Weeks in Treatment: 17 Active Problems Location of Pain Severity and Description of Pain Patient Has Paino No Site Locations With Dressing Change: No Pain Management and Medication Current Pain Management: Electronic Signature(s) Signed: 05/18/2016 5:24:41 PM By: Alejandro MullingPinkerton, Debra Entered By: Alejandro MullingPinkerton, Debra on 05/15/2016 08:08:13 [name], [place] C. ([id number]) -------------------------------------------------------------------------------- Patient/Caregiver Education Details [name] RingerBLACKWELL, [place] Date of Service: 05/15/2016 8:00 AM Patient Name: C. Patient Account Number: [id number] Medical Record Treating RN: Phillis Haggisinkerton, [name] [id number] Number: Other Clinician: Date of Birth/Gender: 31-Mar-1946 8(70 y.o. Male) Treating [name], [name] Primary Care Physician: Physician/Extender: G Referring Physician: SELF, REFERRED Weeks in Treatment: 17 Education Assessment Education Provided To: Patient Education Topics Provided  Wound/Skin Impairment: Handouts: Other: keep dressings clean and dry Methods: Demonstration, Explain/Verbal Responses: State  content correctly Electronic Signature(s) Signed: 05/18/2016 5:24:41 PM By: Alejandro Mulling Entered By: Alejandro Mulling on 05/15/2016 08:34:24 [name] ([id number]) -------------------------------------------------------------------------------- Wound Assessment Details [name] Date of Service: 05/15/2016 8:00 AM Patient Name: C. Patient Account Number: [id number] Medical Record Treating RN: [name] [id number] Number: Other Clinician: Date of Birth/Sex: 1946/06/25 (70 y.o. Male) Treating [name], [name] Primary Care Physician: Physician/Extender: G Referring Physician: SELF, REFERRED Weeks in Treatment: 17 Wound Status Wound Number: 1 Primary Venous Leg Ulcer Etiology: Wound Location: Right, Lateral Malleolus Wound Open Wounding Event: Gradually Appeared Status: Date Acquired: 08/11/2015 Comorbid Cataracts, Chronic Obstructive Weeks Of Treatment: 17 History: Pulmonary Disease (COPD), Clustered Wound: No Osteoarthritis Photos Photo Uploaded By: Alejandro Mulling on 05/15/2016 08:33:11 Wound Measurements Length: (cm) 6.5 Width: (cm) 7.5 Depth: (cm) 0.2 Area: (cm) 38.288 Volume: (cm) 7.658 % Reduction in Area: -121.6% % Reduction in Volume: -47.7% Epithelialization: None Tunneling: No Undermining: No Wound Description Full Thickness Without Exposed Classification: Support Structures Wound Margin: Thickened Exudate Large Amount: Exudate Type: Serosanguineous Exudate Color: red, brown Foul Odor After Cleansing: No Wound Bed Gentz, Elion C. ([id number]) Granulation Amount: Medium (34-66%) Exposed Structure Granulation Quality: Pink Fascia Exposed: No Necrotic Amount: Medium (34-66%) Fat Layer Exposed: No Necrotic Quality: Adherent Slough Tendon Exposed: No Muscle Exposed: No Joint Exposed: No Bone Exposed: No Limited to Skin Breakdown Periwound Skin Texture Texture Color No Abnormalities Noted: No No Abnormalities  Noted: No Callus: No Atrophie Blanche: No Crepitus: No Cyanosis: No Excoriation: No Ecchymosis: No Fluctuance: No Erythema: No Friable: No Hemosiderin Staining: No Induration: No Mottled: No Localized Edema: No Pallor: No Rash: No Rubor: No Scarring: Yes Temperature / Pain Moisture Temperature: No Abnormality No Abnormalities Noted: No Tenderness on Palpation: Yes Dry / Scaly: No Maceration: Yes Moist: Yes Wound Preparation Ulcer Cleansing: Other: soap and water, Topical Anesthetic Applied: Other: lidocaine 4%, Treatment Notes Wound #1 (Right, Lateral Malleolus) 1. Cleansed with: Cleanse wound with antibacterial soap and water 2. Anesthetic Topical Lidocaine 4% cream to wound bed prior to debridement 3. Peri-wound Care: Moisturizing lotion 4. Dressing Applied: Hydrafera Blue 5. Secondary Dressing Applied ABD Pad 7. Secured with 4-Layer Compression System - Right Lower Extremity Notes JAMMAL, SARR ([id number]) xtrasorb Electronic Signature(s) Signed: 05/18/2016 5:24:41 PM By: Alejandro Mulling Entered By: Alejandro Mulling on 05/15/2016 08:41:07 [name] ([id number]) -------------------------------------------------------------------------------- Wound Assessment Details [name] Date of Service: 05/15/2016 8:00 AM Patient Name: C. Patient Account Number: [id number] Medical Record Treating RN: [name] [id number] Number: Other Clinician: Date of Birth/Sex: May 08, 1946 (70 y.o. Male) Treating [name], [name] Primary Care Physician: Physician/Extender: G Referring Physician: SELF, REFERRED Weeks in Treatment: 17 Wound Status Wound Number: 2 Primary Venous Leg Ulcer Etiology: Wound Location: Right Malleolus - Medial Wound Open Wounding Event: Gradually Appeared Status: Date Acquired: 01/30/2016 Comorbid Cataracts, Chronic Obstructive Weeks Of Treatment: 15 History: Pulmonary Disease (COPD), Clustered Wound:  Yes Osteoarthritis Photos Photo Uploaded By: Alejandro Mulling on 05/15/2016 08:33:12 Wound Measurements Length: (cm) 6.7 Width: (cm) 4.5 Depth: (cm) 0.2 Area: (cm) 23.68 Volume: (cm) 4.736 % Reduction in Area: -105.1% % Reduction in Volume: -310% Epithelialization: None Tunneling: No Undermining: No Wound Description Full Thickness Without Exposed Classification: Support Structures Wound Margin: Distinct, outline attached Exudate Large Amount: Exudate Type: Serosanguineous Exudate Color: red, brown Foul Odor After Cleansing: No Wound Bed Strey, Yeshua C. ([id number]) Granulation Amount: Medium (34-66%) Exposed Structure Granulation Quality: Pink Fascia Exposed: No Necrotic Amount:  Medium (34-66%) Fat Layer Exposed: No Necrotic Quality: Adherent Slough Tendon Exposed: No Muscle Exposed: No Joint Exposed: No Bone Exposed: No Limited to Skin Breakdown Periwound Skin Texture Texture Color No Abnormalities Noted: No No Abnormalities Noted: No Callus: No Atrophie Blanche: No Crepitus: No Cyanosis: No Excoriation: No Ecchymosis: No Fluctuance: No Erythema: No Friable: No Hemosiderin Staining: No Induration: No Mottled: No Localized Edema: No Pallor: No Rash: No Rubor: No Scarring: Yes Temperature / Pain Moisture Temperature: No Abnormality No Abnormalities Noted: No Tenderness on Palpation: Yes Dry / Scaly: No Maceration: Yes Moist: Yes Wound Preparation Ulcer Cleansing: Other: soap and water, Topical Anesthetic Applied: Other: lidocaine 4%, Treatment Notes Wound #2 (Right, Medial Malleolus) 1. Cleansed with: Cleanse wound with antibacterial soap and water 2. Anesthetic Topical Lidocaine 4% cream to wound bed prior to debridement 3. Peri-wound Care: Moisturizing lotion 4. Dressing Applied: Hydrafera Blue 5. Secondary Dressing Applied ABD Pad 7. Secured with 4-Layer Compression System - Right Lower Extremity Notes ELIGE, SHOUSE ([id number]) xtrasorb Electronic Signature(s) Signed: 05/18/2016 5:24:41 PM By: Alejandro Mulling Entered By: Alejandro Mulling on 05/15/2016 08:21:59 [name] ([id number]) -------------------------------------------------------------------------------- Vitals Details [name] Date of Service: 05/15/2016 8:00 AM Patient Name: C. Patient Account Number: [id number] Medical Record Treating RN: [name] [id number] Number: Other Clinician: Date of Birth/Sex: 03/03/1946 (70 y.o. Male) Treating [name], [name] Primary Care Physician: Physician/Extender: G Referring Physician: SELF, REFERRED Weeks in Treatment: 17 Vital Signs Time Taken: 08:09 Temperature (F): 97.8 Height (in): 76 Pulse (bpm): 67 Weight (lbs): 238 Respiratory Rate (breaths/min): 18 Body Mass Index (BMI): 29 Blood Pressure (mmHg): 144/78 Reference Range: 80 - 120 mg / dl Electronic Signature(s) Signed: 05/18/2016 5:24:41 PM By: Alejandro Mulling Entered By: Alejandro Mulling on 05/15/2016 08:10:00

## 2016-05-19 NOTE — Progress Notes (Signed)
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 OAKES, MCCREADY ([id number]) Electronic Signature(s) Signed: 05/15/2016 3:24:48 PM By: [name] MD Entered By: [name] on 05/15/2016 08:57:51

## 2016-05-22 ENCOUNTER — Encounter: Payer: Medicare HMO | Admitting: Internal Medicine

## 2016-05-22 DIAGNOSIS — I87331 Chronic venous hypertension (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: Secondary | ICD-10-CM | POA: Diagnosis not present

## 2016-05-23 NOTE — Progress Notes (Signed)
[name], [name] ([id number]) Visit Report for 05/22/2016 Arrival Information Details [name] Date of Service: 05/22/2016 8:00 AM Patient Name: C. Patient Account Number: [id number] Medical Record Treating RN: [name] [id number] Number: Other Clinician: Date of Birth/Sex: 08-06-1946 (70 y.o. Male) Treating [name] Primary Care Physician: Physician/Extender: [name] Referring Physician: SELF, [name] Weeks in Treatment: 18 Visit Information History Since Last Visit All ordered tests and consults were completed: No Patient Arrived: Ambulatory Added or deleted any medications: No Arrival Time: 08:05 Any new allergies or adverse reactions: No Accompanied By: self Had a fall or experienced change in No Transfer Assistance: None activities of daily living that may affect Patient Requires Transmission- No risk of falls: Based Precautions: Signs or symptoms of abuse/neglect since last No Patient Has Alerts: Yes visito Patient Alerts: Patient on Blood Hospitalized since last visit: No Thinner Pain Present Now: No Electronic Signature(s) Signed: 05/22/2016 4:46:10 PM By: [name] Entered By: [name] on 05/22/2016 08:05:34 [name] ([id number]) -------------------------------------------------------------------------------- Encounter Discharge Information Details [name] Date of Service: 05/22/2016 8:00 AM Patient Name: C. Patient Account Number: [id number] Medical Record Treating RN: [name] [id number] Number: Other Clinician: Date of Birth/Sex: [birth year] (70 y.o. Male) Treating [name] Primary Care Physician: Physician/Extender: [name] Referring Physician: SELF, [name] Weeks in Treatment: 55 Encounter Discharge Information Items Discharge Pain Level: 0 Discharge Condition: Stable Ambulatory Status: Ambulatory Discharge Destination: Home Private Transportation: Auto Accompanied By: self Schedule  Follow-up Appointment: Yes Medication Reconciliation completed and Yes provided to Patient/Care [name]: Clinical Summary of Care: Electronic Signature(s) Signed: 05/22/2016 4:46:10 PM By: [name] Entered By: [name] on 05/22/2016 08:35:45 [name] ([id number]) -------------------------------------------------------------------------------- Lower Extremity Assessment Details [name] Date of Service: 05/22/2016 8:00 AM Patient Name: C. Patient Account Number: [id number] Medical Record Treating RN: [name] [id number] Number: Other Clinician: Date of Birth/Sex: 12/15/1945 (70 y.o. Male) Treating [name] Primary Care Physician: Physician/Extender: [name] Referring Physician: SELF, [name] Weeks in Treatment: 18 Edema Assessment Assessed: [Left: No] [Right: No] E[Left: dema] [Right: :] Calf Left: Right: Point of Measurement: 40 cm From Medial Instep cm 35.4 cm Ankle Left: Right: Point of Measurement: 12 cm From Medial Instep cm 23.2 cm Vascular Assessment Pulses: Posterior Tibial Dorsalis Pedis Palpable: [Right:Yes] Extremity colors, hair growth, and conditions: Extremity Color: [Right:Hyperpigmented] Temperature of Extremity: [Right:Warm] Capillary Refill: [Right:< 3 seconds] Toe Nail Assessment Left: Right: Thick: Yes Discolored: Yes Deformed: Yes Improper Length and Hygiene: No Electronic Signature(s) Signed: 05/22/2016 4:46:10 PM By: [name] Entered By: [name] on 05/22/2016 08:17:16 [name] ([id number]) [name], [name] ([id number]) -------------------------------------------------------------------------------- Multi Wound Chart Details [name] Date of Service: 05/22/2016 8:00 AM Patient Name: C. Patient Account Number: [id number] Medical Record Treating RN: [name] [id number] Number: Other Clinician: Date of Birth/Sex: 1946-02-27 (70 y.o. Male) Treating [name],  [name] Primary Care Physician: Physician/Extender: [name] Referring Physician: SELF, [name] Weeks in Treatment: 18 Vital Signs Height(in): 76 Pulse(bpm): 77 Weight(lbs): 238 Blood Pressure 142/84 (mmHg): Body Mass Index(BMI): 29 Temperature(F): 98.0 Respiratory Rate 18 (breaths/min): Photos: [1:No Photos] [2:No Photos] [N/A:N/A] Wound Location: [1:Right Malleolus - Lateral] [2:Right Malleolus - Medial] [N/A:N/A] Wounding Event: [1:Gradually Appeared] [2:Gradually Appeared] [N/A:N/A] Primary Etiology: [1:Venous Leg Ulcer] [2:Venous Leg Ulcer] [N/A:N/A] Comorbid History: [1:Cataracts, Chronic Obstructive Pulmonary Disease (COPD), Osteoarthritis] [2:Cataracts, Chronic Obstructive Pulmonary Disease (COPD), Osteoarthritis] [N/A:N/A] Date Acquired: [1:08/11/2015] [2:01/30/2016] [N/A:N/A] Weeks of Treatment: [1:18] [2:16] [N/A:N/A] Wound Status: [1:Open] [2:Open] [N/A:N/A] Clustered Wound: [1:No] [2:Yes] [N/A:N/A] Measurements L x W x D 6.3x7.5x0.2 [2:6.2x3.8x0.2] [N/A:N/A] (cm)  Area (cm) : [1:37.11] [2:18.504] [N/A:N/A] Volume (cm) : [1:7.422] [2:3.701] [N/A:N/A] % Reduction in Area: [1:-114.80%] [2:-60.30%] [N/A:N/A] % Reduction in Volume: -43.20% [2:-220.40%] [N/A:N/A] Classification: [1:Full Thickness Without Exposed Support Structures] [2:Full Thickness Without Exposed Support Structures] [N/A:N/A] Exudate Amount: [1:Large] [2:Large] [N/A:N/A] Exudate Type: [1:Serosanguineous] [2:Serosanguineous] [N/A:N/A] Exudate Color: [1:red, brown] [2:red, brown] [N/A:N/A] Wound Margin: [1:Thickened] [2:Distinct, outline attached] [N/A:N/A] Granulation Amount: [1:Medium (34-66%)] [2:Medium (34-66%)] [N/A:N/A] Granulation Quality: [1:Pink] [2:Pink] [N/A:N/A] Necrotic Amount: [1:Medium (34-66%)] [2:Medium (34-66%)] [N/A:N/A] Exposed Structures: Fascia: No Fascia: No N/A Fat: No Fat: No Tendon: No Tendon: No Muscle: No Muscle: No Joint: No Joint: No Bone: No Bone: No Limited to  Skin Limited to Skin Breakdown Breakdown Epithelialization: None None N/A Periwound Skin Texture: Scarring: Yes Scarring: Yes N/A Edema: No Edema: No Excoriation: No Excoriation: No Induration: No Induration: No Callus: No Callus: No Crepitus: No Crepitus: No Fluctuance: No Fluctuance: No Friable: No Friable: No Rash: No Rash: No Periwound Skin Maceration: Yes Maceration: Yes N/A Moisture: Moist: Yes Moist: Yes Dry/Scaly: No Dry/Scaly: No Periwound Skin Color: Atrophie Blanche: No Atrophie Blanche: No N/A Cyanosis: No Cyanosis: No Ecchymosis: No Ecchymosis: No Erythema: No Erythema: No Hemosiderin Staining: No Hemosiderin Staining: No Mottled: No Mottled: No Pallor: No Pallor: No Rubor: No Rubor: No Temperature: No Abnormality No Abnormality N/A Tenderness on Yes Yes N/A Palpation: Wound Preparation: Ulcer Cleansing: Other: Ulcer Cleansing: Other: N/A soap and water soap and water Topical Anesthetic Topical Anesthetic Applied: Other: lidocaine Applied: Other: lidocaine 4% 4% Treatment Notes Electronic Signature(s) Signed: 05/22/2016 4:46:10 PM By: [name] Entered By: [name] on 05/22/2016 08:25:41 [name] ([id number]) -------------------------------------------------------------------------------- Multi-Disciplinary Care Plan Details [name] Date of Service: 05/22/2016 8:00 AM Patient Name: C. Patient Account Number: [id number] Medical Record Treating RN: [name] [id number] Number: Other Clinician: Date of Birth/Sex: Sep 29, 1945 (70 y.o. Male) Treating [name] Primary Care Physician: Physician/Extender: [name] Referring Physician: SELF, [name] Weeks in Treatment: 18 Active Inactive Venous Leg Ulcer Nursing Diagnoses: Knowledge deficit related to disease process and management Goals: Patient will maintain optimal edema control Date Initiated: 04/03/2016 Goal Status:  Active Interventions: Compression as ordered Treatment Activities: Therapeutic compression applied : 04/03/2016 Notes: Wound/Skin Impairment Nursing Diagnoses: Impaired tissue integrity Goals: Ulcer/skin breakdown will heal within 14 weeks Date Initiated: 01/17/2016 Goal Status: Active Interventions: Assess ulceration(s) every visit Notes: Electronic Signature(s) Signed: 05/22/2016 4:46:10 PM By: Aurelio Jew, [name] ([id number]) Entered By: [name] on 05/22/2016 08:25:33 [name] ([id number]) -------------------------------------------------------------------------------- Pain Assessment Details [name] Date of Service: 05/22/2016 8:00 AM Patient Name: C. Patient Account Number: [id number] Medical Record Treating RN: [name] [id number] Number: Other Clinician: Date of Birth/Sex: [birth year] (70 y.o. Male) Treating [name] Primary Care Physician: Physician/Extender: [name] Referring Physician: SELF, [name] Weeks in Treatment: 18 Active Problems Location of Pain Severity and Description of Pain Patient Has Paino No Site Locations With Dressing Change: No Pain Management and Medication Current Pain Management: Electronic Signature(s) Signed: 05/22/2016 4:46:10 PM By: [name] Entered By: [name] on 05/22/2016 08:05:42 [name] ([id number]) -------------------------------------------------------------------------------- Patient/Caregiver Education Details [name] Date of Service: 05/22/2016 8:00 AM Patient Name: C. Patient Account Number: [id number] Medical Record Treating RN: [name] [id number] Number: Other Clinician: Date of Birth/Gender: 06-11-1946 (70 y.o. Male) Treating [name] Primary Care Physician: Physician/Extender: [name] Referring Physician: SELF, [name] Weeks in Treatment: 57 Education Assessment Education Provided To: Patient Education Topics  Provided Wound/Skin Impairment: Handouts: Other: keep wrap clean and dry Methods: Demonstration, Explain/Verbal Responses: State  content correctly Electronic Signature(s) Signed: 05/22/2016 4:46:10 PM By: [name] Entered By: [name] on 05/22/2016 08:36:01 [name] ([id number]) -------------------------------------------------------------------------------- Wound Assessment Details [name] Date of Service: 05/22/2016 8:00 AM Patient Name: C. Patient Account Number: [id number] Medical Record Treating RN: [name] [id number] Number: Other Clinician: Date of Birth/Sex: 06-20-1946 (70 y.o. Male) Treating [name] Primary Care Physician: Physician/Extender: [name] Referring Physician: SELF, [name] Weeks in Treatment: 18 Wound Status Wound Number: 1 Primary Venous Leg Ulcer Etiology: Wound Location: Right Malleolus - Lateral Wound Open Wounding Event: Gradually Appeared Status: Date Acquired: 08/11/2015 Comorbid Cataracts, Chronic Obstructive Weeks Of Treatment: 18 History: Pulmonary Disease (COPD), Clustered Wound: No Osteoarthritis Photos Photo Uploaded By: [name] on 05/22/2016 08:49:36 Wound Measurements Length: (cm) 6.3 Width: (cm) 7.5 Depth: (cm) 0.2 Area: (cm) 37.11 Volume: (cm) 7.422 % Reduction in Area: -114.8% % Reduction in Volume: -43.2% Epithelialization: None Tunneling: No Undermining: No Wound Description Full Thickness Without Exposed Classification: Support Structures Wound Margin: Thickened Exudate Large Amount: Exudate Type: Serosanguineous Exudate Color: red, brown Foul Odor After Cleansing: No Wound Bed [name], [name] ([id number]) Granulation Amount: Medium (34-66%) Exposed Structure Granulation Quality: Pink Fascia Exposed: No Necrotic Amount: Medium (34-66%) Fat Layer Exposed: No Necrotic Quality: Adherent Slough Tendon Exposed: No Muscle Exposed: No Joint Exposed:  No Bone Exposed: No Limited to Skin Breakdown Periwound Skin Texture Texture Color No Abnormalities Noted: No No Abnormalities Noted: No Callus: No Atrophie Blanche: No Crepitus: No Cyanosis: No Excoriation: No Ecchymosis: No Fluctuance: No Erythema: No Friable: No Hemosiderin Staining: No Induration: No Mottled: No Localized Edema: No Pallor: No Rash: No Rubor: No Scarring: Yes Temperature / Pain Moisture Temperature: No Abnormality No Abnormalities Noted: No Tenderness on Palpation: Yes Dry / Scaly: No Maceration: Yes Moist: Yes Wound Preparation Ulcer Cleansing: Other: soap and water, Topical Anesthetic Applied: Other: lidocaine 4%, Treatment Notes Wound #1 (Right, Lateral Malleolus) 1. Cleansed with: Cleanse wound with antibacterial soap and water 2. Anesthetic Topical Lidocaine 4% cream to wound bed prior to debridement 3. Peri-wound Care: Barrier cream 4. Dressing Applied: Hydrafera Blue 5. Secondary Dressing Applied ABD Pad 7. Secured with Tape 4-Layer Compression System - Right Lower Extremity [name], [name] ([id number]) Notes xtrasorb, unna to Ecologist) Signed: 05/22/2016 4:46:10 PM By: [name] Entered By: [name] on 05/22/2016 08:19:51 [name] ([id number]) -------------------------------------------------------------------------------- Wound Assessment Details [name] Date of Service: 05/22/2016 8:00 AM Patient Name: C. Patient Account Number: [id number] Medical Record Treating RN: [name] [id number] Number: Other Clinician: Date of Birth/Sex: [birth year] (70 y.o. Male) Treating [name] Primary Care Physician: Physician/Extender: [name] Referring Physician: SELF, [name] Weeks in Treatment: 18 Wound Status Wound Number: 2 Primary Venous Leg Ulcer Etiology: Wound Location: Right Malleolus - Medial Wound Open Wounding Event: Gradually Appeared Status: Date  Acquired: 01/30/2016 Comorbid Cataracts, Chronic Obstructive Weeks Of Treatment: 16 History: Pulmonary Disease (COPD), Clustered Wound: Yes Osteoarthritis Photos Photo Uploaded By: [name] on 05/22/2016 08:49:39 Wound Measurements Length: (cm) 6.2 Width: (cm) 3.8 Depth: (cm) 0.2 Area: (cm) 18.504 Volume: (cm) 3.701 % Reduction in Area: -60.3% % Reduction in Volume: -220.4% Epithelialization: None Tunneling: No Undermining: No Wound Description Full Thickness Without Exposed Classification: Support Structures Wound Margin: Distinct, outline attached Exudate Large Amount: Exudate Type: Serosanguineous Exudate Color: red, brown Foul Odor After Cleansing: No Wound Bed [name], [name] ([id number]) Granulation Amount: Medium (34-66%) Exposed Structure Granulation Quality: Pink Fascia Exposed: No Necrotic Amount: Medium (34-66%) Fat Layer Exposed: No Necrotic Quality: Adherent Slough  Tendon Exposed: No Muscle Exposed: No Joint Exposed: No Bone Exposed: No Limited to Skin Breakdown Periwound Skin Texture Texture Color No Abnormalities Noted: No No Abnormalities Noted: No Callus: No Atrophie Blanche: No Crepitus: No Cyanosis: No Excoriation: No Ecchymosis: No Fluctuance: No Erythema: No Friable: No Hemosiderin Staining: No Induration: No Mottled: No Localized Edema: No Pallor: No Rash: No Rubor: No Scarring: Yes Temperature / Pain Moisture Temperature: No Abnormality No Abnormalities Noted: No Tenderness on Palpation: Yes Dry / Scaly: No Maceration: Yes Moist: Yes Wound Preparation Ulcer Cleansing: Other: soap and water, Topical Anesthetic Applied: Other: lidocaine 4%, Treatment Notes Wound #2 (Right, Medial Malleolus) 1. Cleansed with: Cleanse wound with antibacterial soap and water 2. Anesthetic Topical Lidocaine 4% cream to wound bed prior to debridement 3. Peri-wound Care: Barrier cream 4. Dressing Applied: Hydrafera  Blue 5. Secondary Dressing Applied ABD Pad 7. Secured with Tape 4-Layer Compression System - Right Lower Extremity [name] StackBLACKWELL, [name] ([id number]) Notes xtrasorb, unna to Ecologistanchor Electronic Signature(s) Signed: 05/22/2016 4:46:10 PM By: [name] MullingPinkerton, Debra Entered By: [name] MullingPinkerton, Debra on 05/22/2016 08:18:32 [name] StackBLACKWELL, [name] ([id number]) -------------------------------------------------------------------------------- Vitals Details [name] RingerBLACKWELL, [name] Date of Service: 05/22/2016 8:00 AM Patient Name: C. Patient Account Number: [id number] Medical Record Treating RN: [name] Haggisinkerton, [name] [id number] Number: Other Clinician: Date of Birth/Sex: 07/09/1946 [age] y.o. Male) Treating [name] Primary Care Physician: Physician/Extender: [name] Referring Physician: SELF, [name] Weeks in Treatment: 18 Vital Signs Time Taken: 08:06 Temperature (F): 98.0 Height (in): 76 Pulse (bpm): 77 Weight (lbs): 238 Respiratory Rate (breaths/min): 18 Body Mass Index (BMI): 29 Blood Pressure (mmHg): 142/84 Reference Range: 80 - 120 mg / dl Electronic Signature(s) Signed: 05/22/2016 4:46:10 PM By: [name] MullingPinkerton, Debra Entered By: [name] MullingPinkerton, Debra on 05/22/2016 08:07:33

## 2016-05-23 NOTE — Progress Notes (Signed)
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 Facility Procedures CPT4: Description Modifier Quantity Code 44010272 11042 - DEB SUBQ TISSUE 20 SQ CM/< 1 ICD-10 Description Diagnosis I87.331 Chronic venous hypertension (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 CPT4: 53664403 11045 - DEB SUBQ TISS EA ADDL 20CM 2 ICD-10 Description Diagnosis I87.331 Chronic venous hypertension (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 Physician Procedures CPT4: Description Modifier Quantity Code 4742595 11042 - WC PHYS SUBQ TISS 20 SQ CM 1 ICD-10 Description Diagnosis I87.331 Chronic venous hypertension (  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 CPT4: 1610960 11045 - WC PHYS SUBQ TISS EA ADDL 20 CM 2 [name], [name] ([id number]) Electronic Signature(s) Signed: 05/23/2016 8:02:21 AM By: [name] MD Entered By: [name] on 05/22/2016 08:54:52

## 2016-05-25 DIAGNOSIS — I87331 Chronic venous hypertension (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: Secondary | ICD-10-CM | POA: Diagnosis not present

## 2016-05-26 NOTE — Progress Notes (Signed)
[name], Elhadj C. ([id number]) Visit Report for 05/25/2016 Arrival Information Details Patient Name: [name], Elek C. Date of Service: 05/25/2016 9:15 AM Medical Record Number: [id number] Patient Account Number: [id number] Date of Birth/Sex: 08/28/1945 [age] y.o. Male) Treating RN: Huel CoventryWoody, Kim Primary Care Physician: Other Clinician: Referring Physician: SELF, REFERRED Treating Physician/Extender: Rudene ReBritto, Errol Weeks in Treatment: 18 Visit Information History Since Last Visit Added or deleted any medications: No Patient Arrived: Ambulatory Any new allergies or adverse reactions: No Arrival Time: 09:08 Had a fall or experienced change in No Accompanied By: self activities of daily living that may affect Transfer Assistance: None risk of falls: Patient Identification Verified: Yes Signs or symptoms of abuse/neglect No Secondary Verification Process Yes since last visito Completed: Hospitalized since last visit: No Patient Requires Transmission- No Has Dressing in Place as Prescribed: Yes Based Precautions: Has Compression in Place as Yes Patient Has Alerts: Yes Prescribed: Patient Alerts: Patient on Blood Has Footwear/Offloading in Place as Yes Thinner Prescribed: Right: Surgical Shoe with Pressure Relief Insole Pain Present Now: No Electronic Signature(s) Signed: 05/25/2016 4:17:19 PM By: Elliot GurneyWoody, RN, BSN, Kim RN, BSN Entered By: Elliot GurneyWoody, RN, BSN, Kim on 05/25/2016 09:09:08 [name], Shawnee C. ([id number]) -------------------------------------------------------------------------------- Compression Therapy Details Patient Name: [name], Jarel C. Date of Service: 05/25/2016 9:15 AM Medical Record Number: [id number] Patient Account Number: [id number] Date of Birth/Sex: 06/03/1946 [age] y.o. Male) Treating RN: Huel CoventryWoody, Kim Primary Care Physician: Other Clinician: Referring Physician: SELF, REFERRED Treating Physician/Extender: Rudene ReBritto, Errol Weeks in Treatment:  18 Compression Therapy Performed for Wound Wound #1 Right,Lateral Malleolus Assessment: Performed By: Cletus Gashlinician Woody, Kim, RN Compression Type: Four Layer Pre Treatment ABI: 1.2 Electronic Signature(s) Signed: 05/25/2016 4:17:19 PM By: Elliot GurneyWoody, RN, BSN, Kim RN, BSN Entered By: Elliot GurneyWoody, RN, BSN, Kim on 05/25/2016 24:40:1009:28:58 [name], Jarmarcus C. ([id number]) -------------------------------------------------------------------------------- Compression Therapy Details Patient Name: [name], Caydin C. Date of Service: 05/25/2016 9:15 AM Medical Record Number: [id number] Patient Account Number: [id number] Date of Birth/Sex: 10/25/1945 [age] y.o. Male) Treating RN: Huel CoventryWoody, Kim Primary Care Physician: Other Clinician: Referring Physician: SELF, REFERRED Treating Physician/Extender: Rudene ReBritto, Errol Weeks in Treatment: 18 Compression Therapy Performed for Wound Wound #2 Right,Medial Malleolus Assessment: Performed By: Cletus Gashlinician Woody, Kim, RN Compression Type: Four Layer Pre Treatment ABI: 1.2 Electronic Signature(s) Signed: 05/25/2016 4:17:19 PM By: Elliot GurneyWoody, RN, BSN, Kim RN, BSN Entered By: Elliot GurneyWoody, RN, BSN, Kim on 05/25/2016 09:28:59 [name], [place] C. ([id number]) -------------------------------------------------------------------------------- Encounter Discharge Information Details Patient Name: [name], Ellery C. Date of Service: 05/25/2016 9:15 AM Medical Record Number: [id number] Patient Account Number: [id number] Date of Birth/Sex: 05/11/1946 [age] y.o. Male) Treating RN: Huel CoventryWoody, Kim Primary Care Physician: Other Clinician: Referring Physician: SELF, REFERRED Treating Physician/Extender: Rudene ReBritto, Errol Weeks in Treatment: 7018 Encounter Discharge Information Items Discharge Pain Level: 0 Discharge Condition: Stable Ambulatory Status: Ambulatory Discharge Destination: Home Private Transportation: Auto Accompanied By: self Schedule Follow-up Appointment: Yes Medication Reconciliation  completed and Yes provided to Patient/Care Verlin Duke: Clinical Summary of Care: Electronic Signature(s) Signed: 05/25/2016 4:17:19 PM By: Elliot GurneyWoody, RN, BSN, Kim RN, BSN Entered By: Elliot GurneyWoody, RN, BSN, Kim on 05/25/2016 09:29:58 [name], Slaton C. ([id number]) -------------------------------------------------------------------------------- Patient/Caregiver Education Details Patient Name: [name], Petra C. Date of Service: 05/25/2016 9:15 AM Medical Record Number: [id number] Patient Account Number: [id number] Date of Birth/Gender: 09/04/1945 [age] y.o. Male) Treating RN: Huel CoventryWoody, Kim Primary Care Physician: Other Clinician: Referring Physician: SELF, REFERRED Treating Physician/Extender: Rudene ReBritto, Errol Weeks in Treatment: 18 Education Assessment Education Provided To: Patient Education Topics Provided Venous: Handouts: Controlling Swelling with Multilayered Compression Wraps Methods: Demonstration Responses: State content correctly  Electronic Signature(s) Signed: 05/25/2016 4:17:19 PM By: Elliot Gurney, RN, BSN, Kim RN, BSN Entered By: Elliot Gurney, RN, BSN, Kim on 05/25/2016 09:29:43 [name] Stack ([id number]) -------------------------------------------------------------------------------- Wound Assessment Details Patient Name: [name] Stack Date of Service: 05/25/2016 9:15 AM Medical Record Number: [id number] Patient Account Number: [id number] Date of Birth/Sex: [birth year] (70 y.o. Male) Treating RN: Huel Coventry Primary Care Physician: Other Clinician: Referring Physician: SELF, REFERRED Treating Physician/Extender: Rudene Re in Treatment: 18 Wound Status Wound Number: 1 Primary Venous Leg Ulcer Etiology: Wound Location: Right Malleolus - Lateral Wound Open Wounding Event: Gradually Appeared Status: Date Acquired: 08/11/2015 Comorbid Cataracts, Chronic Obstructive Weeks Of Treatment: 18 History: Pulmonary Disease (COPD), Clustered Wound: No Osteoarthritis Wound  Measurements Length: (cm) 6.3 Width: (cm) 7.2 Depth: (cm) 0.2 Area: (cm) 35.626 Volume: (cm) 7.125 % Reduction in Area: -106.2% % Reduction in Volume: -37.4% Epithelialization: None Wound Description Full Thickness Without Exposed Classification: Support Structures Wound Margin: Thickened Exudate Large Amount: Exudate Type: Serosanguineous Exudate Color: red, brown Foul Odor After Cleansing: No Wound Bed Granulation Amount: Medium (34-66%) Exposed Structure Granulation Quality: Pink Fascia Exposed: No Necrotic Amount: Medium (34-66%) Fat Layer Exposed: No Necrotic Quality: Adherent Slough Tendon Exposed: No Muscle Exposed: No Joint Exposed: No Bone Exposed: No Limited to Skin Breakdown Periwound Skin Texture Texture Color No Abnormalities Noted: No No Abnormalities Noted: No Callus: No Atrophie Blanche: No MORRILL, BOMKAMP. ([id number]) Crepitus: No Cyanosis: No Excoriation: No Ecchymosis: No Fluctuance: No Erythema: No Friable: No Hemosiderin Staining: No Induration: No Mottled: No Localized Edema: No Pallor: No Rash: No Rubor: No Scarring: Yes Temperature / Pain Moisture Temperature: No Abnormality No Abnormalities Noted: No Tenderness on Palpation: Yes Dry / Scaly: No Maceration: Yes Moist: Yes Wound Preparation Ulcer Cleansing: Other: soap and water, Topical Anesthetic Applied: None Treatment Notes Wound #1 (Right, Lateral Malleolus) 1. Cleansed with: Cleanse wound with antibacterial soap and water 2. Anesthetic Topical Lidocaine 4% cream to wound bed prior to debridement 3. Peri-wound Care: Barrier cream 4. Dressing Applied: Hydrafera Blue 5. Secondary Dressing Applied ABD Pad 7. Secured with Tape 4-Layer Compression System - Right Lower Extremity Notes xtrasorb, unna to Ecologist) Signed: 05/25/2016 4:17:19 PM By: Elliot Gurney, RN, BSN, Kim RN, BSN Entered By: Elliot Gurney, RN, BSN, Kim on 05/25/2016  09:28:15 [name] Stack ([id number]) -------------------------------------------------------------------------------- Wound Assessment Details Patient Name: [name] Stack Date of Service: 05/25/2016 9:15 AM Medical Record Number: [id number] Patient Account Number: [id number] Date of Birth/Sex: March 30, 1946 (70 y.o. Male) Treating RN: Huel Coventry Primary Care Physician: Other Clinician: Referring Physician: SELF, REFERRED Treating Physician/Extender: Rudene Re in Treatment: 18 Wound Status Wound Number: 2 Primary Venous Leg Ulcer Etiology: Wound Location: Right Malleolus - Medial Wound Open Wounding Event: Gradually Appeared Status: Date Acquired: 01/30/2016 Comorbid Cataracts, Chronic Obstructive Weeks Of Treatment: 16 History: Pulmonary Disease (COPD), Clustered Wound: Yes Osteoarthritis Wound Measurements Length: (cm) 6 Width: (cm) 3.7 Depth: (cm) 0.2 Area: (cm) 17.436 Volume: (cm) 3.487 % Reduction in Area: -51% % Reduction in Volume: -201.9% Epithelialization: None Wound Description Full Thickness Without Exposed Classification: Support Structures Wound Margin: Distinct, outline attached Exudate Large Amount: Exudate Type: Serosanguineous Exudate Color: red, brown Foul Odor After Cleansing: No Wound Bed Granulation Amount: Medium (34-66%) Exposed Structure Granulation Quality: Pink Fascia Exposed: No Necrotic Amount: Medium (34-66%) Fat Layer Exposed: No Necrotic Quality: Adherent Slough Tendon Exposed: No Muscle Exposed: No Joint Exposed: No Bone Exposed: No Limited to Skin Breakdown Periwound Skin Texture Texture Color No Abnormalities Noted: No No Abnormalities Noted:  No Callus: No Atrophie Blanche: No TRAVONTA, GILL. ([id number]) Crepitus: No Cyanosis: No Excoriation: No Ecchymosis: No Fluctuance: No Erythema: No Friable: No Hemosiderin Staining: No Induration: No Mottled: No Localized Edema: No Pallor:  No Rash: No Rubor: No Scarring: Yes Temperature / Pain Moisture Temperature: No Abnormality No Abnormalities Noted: No Tenderness on Palpation: Yes Dry / Scaly: No Maceration: Yes Moist: Yes Wound Preparation Ulcer Cleansing: Other: soap and water, Topical Anesthetic Applied: None Treatment Notes Wound #2 (Right, Medial Malleolus) 1. Cleansed with: Cleanse wound with antibacterial soap and water 2. Anesthetic Topical Lidocaine 4% cream to wound bed prior to debridement 3. Peri-wound Care: Barrier cream 4. Dressing Applied: Hydrafera Blue 5. Secondary Dressing Applied ABD Pad 7. Secured with Tape 4-Layer Compression System - Right Lower Extremity Notes xtrasorb, unna to Ecologist) Signed: 05/25/2016 4:17:19 PM By: Elliot Gurney, RN, BSN, Kim RN, BSN Entered By: Elliot Gurney, RN, BSN, Kim on 05/25/2016 40:98:11

## 2016-05-29 ENCOUNTER — Encounter: Payer: Medicare HMO | Attending: Physician Assistant | Admitting: Physician Assistant

## 2016-05-29 DIAGNOSIS — H269 Unspecified cataract: Secondary | ICD-10-CM | POA: Insufficient documentation

## 2016-05-29 DIAGNOSIS — M1990 Unspecified osteoarthritis, unspecified site: Secondary | ICD-10-CM | POA: Diagnosis not present

## 2016-05-29 DIAGNOSIS — Y832 Surgical operation with anastomosis, bypass or graft as the cause of abnormal reaction of the patient, or of later complication, without mention of misadventure at the time of the procedure: Secondary | ICD-10-CM | POA: Diagnosis not present

## 2016-05-29 DIAGNOSIS — J449 Chronic obstructive pulmonary disease, unspecified: Secondary | ICD-10-CM | POA: Diagnosis not present

## 2016-05-29 DIAGNOSIS — Z86718 Personal history of other venous thrombosis and embolism: Secondary | ICD-10-CM | POA: Diagnosis not present

## 2016-05-29 DIAGNOSIS — I87331 Chronic venous hypertension (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: Secondary | ICD-10-CM | POA: Insufficient documentation

## 2016-05-29 DIAGNOSIS — L97313 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: Secondary | ICD-10-CM | POA: Insufficient documentation

## 2016-05-29 DIAGNOSIS — T85613A 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, initial encounter: Secondary | ICD-10-CM | POA: Insufficient documentation

## 2016-05-29 DIAGNOSIS — Z7901 Long term (current) use of anticoagulants: Secondary | ICD-10-CM | POA: Diagnosis not present

## 2016-06-01 DIAGNOSIS — I87331 Chronic venous hypertension (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: Secondary | ICD-10-CM | POA: Diagnosis not present

## 2016-06-01 NOTE — Progress Notes (Addendum)
PRESS, CASALE ([id number]) Visit Report for 06/01/2016 Arrival Information Details Patient Name: [name], [name]. Date of Service: 06/01/2016 8:00 AM Medical Record Number: [id number] Patient Account Number: [id number] Date of Birth/Sex: 17-May-1946 (70 y.o. Male) Treating RN: [name], RN, BSN, [place] Sink Primary Care Physician: Other Clinician: Referring Physician: SELF, REFERRED Treating Physician/Extender: Eugene Garnet in Treatment: 19 Visit Information History Since Last Visit All ordered tests and consults were completed: No Patient Arrived: Ambulatory Added or deleted any medications: No Arrival Time: 08:05 Any new allergies or adverse reactions: No Accompanied By: self Had a fall or experienced change in No Transfer Assistance: None activities of daily living that may affect Patient Identification Verified: Yes risk of falls: Secondary Verification Process Yes Signs or symptoms of abuse/neglect since last No Completed: visito Patient Requires Transmission- No Hospitalized since last visit: No Based Precautions: Has Dressing in Place as Prescribed: Yes Patient Has Alerts: Yes Has Compression in Place as Prescribed: Yes Patient Alerts: Patient on Blood Pain Present Now: No Thinner Electronic Signature(s) Signed: 06/01/2016 8:31:18 AM By: [name] BSN, RN Entered By: [name] on 06/01/2016 08:31:18 [name] ([id number]) -------------------------------------------------------------------------------- Compression Therapy Details Patient Name: [name] Date of Service: 06/01/2016 8:00 AM Medical Record Number: [id number] Patient Account Number: [id number] Date of Birth/Sex: February 15, 1946 (70 y.o. Male) Treating RN: [name], RN, BSN, [place] Sink Primary Care Physician: Other Clinician: Referring Physician: SELF, REFERRED Treating Physician/Extender: Eugene Garnet in Treatment: 19 Compression Therapy Performed for Wound Wound #1  Right,Lateral Malleolus Assessment: Performed By: Clinician [name], RN, BSN, [place]ink, RN Compression Type: Four Layer Pre Treatment ABI: 1.2 Electronic Signature(s) Signed: 06/01/2016 8:32:55 AM By: [name] BSN, RN Entered By: [name] on 06/01/2016 08:32:55 [name] ([id number]) -------------------------------------------------------------------------------- Compression Therapy Details Patient Name: [name] Date of Service: 06/01/2016 8:00 AM Medical Record Number: [id number] Patient Account Number: [id number] Date of Birth/Sex: [birth year] (70 y.o. Male) Treating RN: [name], RN, BSN, [place] Sink Primary Care Physician: Other Clinician: Referring Physician: SELF, REFERRED Treating Physician/Extender: Eugene Garnet in Treatment: 19 Compression Therapy Performed for Wound Wound #2 Right,Medial Malleolus Assessment: Performed By: Clinician [name], RN, BSN, [place] Sink, RN Compression Type: Four Layer Pre Treatment ABI: 1.2 Electronic Signature(s) Signed: 06/01/2016 8:32:55 AM By: [name] BSN, RN Entered By: [name] on 06/01/2016 08:32:55 [name] ([id number]) -------------------------------------------------------------------------------- Encounter Discharge Information Details Patient Name: [name] Date of Service: 06/01/2016 8:00 AM Medical Record Number: [id number] Patient Account Number: [id number] Date of Birth/Sex: [birth year] (70 y.o. Male) Treating RN: [name], RN, BSN, [place] Sink Primary Care Physician: Other Clinician: Referring Physician: SELF, REFERRED Treating Physician/Extender: Eugene Garnet in Treatment: 19 Encounter Discharge Information Items Discharge Pain Level: 0 Discharge Condition: Stable Ambulatory Status: Ambulatory Discharge Destination: Home Private Transportation: Auto Accompanied By: self Schedule Follow-up Appointment: No Medication Reconciliation completed and No provided to Patient/Care  Janeshia Ciliberto: Clinical Summary of Care: Electronic Signature(s) Signed: 06/01/2016 8:33:57 AM By: [name] BSN, RN Entered By: [name] on 06/01/2016 08:33:57 [name] ([id number]) -------------------------------------------------------------------------------- Patient/Caregiver Education Details Patient Name: [name] Date of Service: 06/01/2016 8:00 AM Medical Record Number: [id number] Patient Account Number: [id number] Date of Birth/Gender: 12-21-1945 (70 y.o. Male) Treating RN: [name], RN, BSN, [place] Sink Primary Care Physician: Other Clinician: Referring Physician: SELF, REFERRED Treating Physician/Extender: Eugene Garnet in Treatment: 19 Education Assessment Education Provided To: Patient Education Topics Provided Electronic Signature(s) Signed: 06/01/2016 3:48:46 PM By: [name] BSN, RN Entered By: [name] on 06/01/2016 08:33:42 [name] ([id number]) --------------------------------------------------------------------------------  Wound Assessment Details Patient Name: [name], [name]. Date of Service: 06/01/2016 8:00 AM Medical Record Number: [id number] Patient Account Number: [id number] Date of Birth/Sex: 27-Aug-1946 (70 y.o. Male) Treating RN: Afful, RN, BSN, Rita Primary Care Physician: Other Clinician: Referring Physician: SELF, REFERRED Treating Physician/Extender: Eugene Garnet in Treatment: 19 Wound Status Wound Number: 1 Primary Venous Leg Ulcer Etiology: Wound Location: Right Malleolus - Lateral Wound Open Wounding Event: Gradually Appeared Status: Date Acquired: 08/11/2015 Comorbid Cataracts, Chronic Obstructive Weeks Of Treatment: 19 History: Pulmonary Disease (COPD), Clustered Wound: No Osteoarthritis Wound Measurements Length: (cm) 5 Width: (cm) 3 Depth: (cm) 0.2 Area: (cm) 11.781 Volume: (cm) 2.356 % Reduction in Area: 31.8% % Reduction in Volume: 54.6% Epithelialization: Small  (1-33%) Tunneling: No Undermining: No Wound Description Full Thickness Without Exposed Classification: Support Structures Wound Margin: Thickened Exudate Large Amount: Exudate Type: Serosanguineous Exudate Color: red, brown Foul Odor After Cleansing: Yes Due to Product Use: No Wound Bed Granulation Amount: Medium (34-66%) Exposed Structure Granulation Quality: Pink Fascia Exposed: No Necrotic Amount: Medium (34-66%) Fat Layer Exposed: No Necrotic Quality: Adherent Slough Tendon Exposed: No Muscle Exposed: No Joint Exposed: No Bone Exposed: No Limited to Skin Breakdown Periwound Skin Texture Texture Color No Abnormalities Noted: No No Abnormalities Noted: No Callus: No Atrophie Blanche: No [name], [name] ([id number]) Crepitus: No Cyanosis: No Excoriation: No Ecchymosis: No Fluctuance: No Erythema: No Friable: No Hemosiderin Staining: No Induration: No Mottled: No Localized Edema: No Pallor: No Rash: No Rubor: No Scarring: Yes Temperature / Pain Moisture Temperature: No Abnormality No Abnormalities Noted: No Dry / Scaly: No Maceration: Yes Moist: Yes Wound Preparation Ulcer Cleansing: Other: soap and water, Topical Anesthetic Applied: None Treatment Notes Wound #1 (Right, Lateral Malleolus) 1. Cleansed with: Cleanse wound with antibacterial soap and water 3. Peri-wound Care: Barrier cream 4. Dressing Applied: Hydrafera Blue 5. Secondary Dressing Applied ABD Pad 7. Secured with Tape 4-Layer Compression System - Right Lower Extremity Notes drawtex, unna to anchor Electronic Signature(s) Signed: 06/01/2016 3:48:46 PM By: [name] BSN, RN Entered By: [name] on 06/01/2016 08:31:59 [name] ([id number]) -------------------------------------------------------------------------------- Wound Assessment Details Patient Name: [name] Date of Service: 06/01/2016 8:00 AM Medical Record Number: [id number] Patient  Account Number: [id number] Date of Birth/Sex: 08-15-1946 (70 y.o. Male) Treating RN: [name], RN, BSN, Rita Primary Care Physician: Other Clinician: Referring Physician: SELF, REFERRED Treating Physician/Extender: Eugene Garnet in Treatment: 19 Wound Status Wound Number: 2 Primary Venous Leg Ulcer Etiology: Wound Location: Right Malleolus - Medial Wound Open Wounding Event: Gradually Appeared Status: Date Acquired: 01/30/2016 Comorbid Cataracts, Chronic Obstructive Weeks Of Treatment: 17 History: Pulmonary Disease (COPD), Clustered Wound: Yes Osteoarthritis Wound Measurements Length: (cm) 6.5 Width: (cm) 3.6 Depth: (cm) 0.1 Area: (cm) 18.378 Volume: (cm) 1.838 % Reduction in Area: -59.2% % Reduction in Volume: -59.1% Epithelialization: Medium (34-66%) Tunneling: No Undermining: No Wound Description Full Thickness Without Exposed Classification: Support Structures Wound Margin: Distinct, outline attached Exudate Large Amount: Exudate Type: Serosanguineous Exudate Color: red, brown Foul Odor After Cleansing: No Wound Bed Granulation Amount: Medium (34-66%) Exposed Structure Granulation Quality: Pink Fascia Exposed: No Necrotic Amount: Medium (34-66%) Fat Layer Exposed: No Necrotic Quality: Adherent Slough Tendon Exposed: No Muscle Exposed: No Joint Exposed: No Bone Exposed: No Limited to Skin Breakdown Periwound Skin Texture Texture Color No Abnormalities Noted: No No Abnormalities Noted: No Callus: No Atrophie Blanche: No [name], [name] ([id number]) Crepitus: No Cyanosis: No Excoriation: No Ecchymosis: No Fluctuance: No Erythema: No Friable: No Hemosiderin Staining: No Induration:  No Mottled: No Localized Edema: No Pallor: No Rash: No Rubor: No Scarring: Yes Temperature / Pain Moisture Temperature: No Abnormality No Abnormalities Noted: No Tenderness on Palpation: Yes Dry / Scaly: No Maceration: Yes Moist: Yes Wound  Preparation Ulcer Cleansing: Other: soap and water, Topical Anesthetic Applied: None Treatment Notes Wound #2 (Right, Medial Malleolus) 1. Cleansed with: Cleanse wound with antibacterial soap and water 3. Peri-wound Care: Barrier cream 4. Dressing Applied: Hydrafera Blue 5. Secondary Dressing Applied ABD Pad 7. Secured with Tape 4-Layer Compression System - Right Lower Extremity Notes drawtex, unna to anchor Electronic Signature(s) Signed: 06/01/2016 3:48:46 PM By: Elpidio EricAfful, Rita BSN, RN Entered By: Elpidio EricAfful, Rita on 06/01/2016 08:32:20

## 2016-06-05 ENCOUNTER — Encounter: Payer: Medicare HMO | Admitting: Physician Assistant

## 2016-06-05 DIAGNOSIS — I87331 Chronic venous hypertension (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: Secondary | ICD-10-CM | POA: Diagnosis not present

## 2016-06-06 NOTE — Progress Notes (Signed)
LYNDAL, REGGIO ([id number]) Visit Report for 06/05/2016 Arrival Information Details Patient Name: KETIH, GOODIE. Date of Service: 06/05/2016 8:00 AM Medical Record Number: [id number] Patient Account Number: [id number] Date of Birth/Sex: 1946-02-01 (70 y.o. Male) Treating RN: [name] Primary Care Physician: Other Clinician: Referring Physician: SELF, REFERRED Treating Physician/Extender: STONE III, HOYT Weeks in Treatment: 20 Visit Information History Since Last Visit All ordered tests and consults were completed: No Patient Arrived: Ambulatory Added or deleted any medications: No Arrival Time: 08:02 Any new allergies or adverse reactions: No Accompanied By: self Had a fall or experienced change in No Transfer Assistance: None activities of daily living that may affect Patient Identification Verified: Yes risk of falls: Secondary Verification Process Yes Signs or symptoms of abuse/neglect since last No Completed: visito Patient Requires Transmission- No Hospitalized since last visit: No Based Precautions: Pain Present Now: No Patient Has Alerts: Yes Patient Alerts: Patient on Blood Thinner Electronic Signature(s) Signed: 06/06/2016 1:21:57 PM By: Alejandro Mulling Entered By: Alejandro Mulling on 06/05/2016 08:02:39 [name] ([id number]) -------------------------------------------------------------------------------- Encounter Discharge Information Details Patient Name: [name] Date of Service: 06/05/2016 8:00 AM Medical Record Number: [id number] Patient Account Number: [id number] Date of Birth/Sex: 08/15/1946 (70 y.o. Male) Treating RN: [name] Primary Care Physician: Other Clinician: Referring Physician: SELF, REFERRED Treating Physician/Extender: STONE III, HOYT Weeks in Treatment: 20 Encounter Discharge Information Items Discharge Pain Level: 0 Discharge Condition: Stable Ambulatory Status: Ambulatory Discharge  Destination: Home Transportation: Private Auto Accompanied By: self Schedule Follow-up Appointment: Yes Medication Reconciliation completed Yes and provided to Patient/Care Mulki Roesler: Patient Clinical Summary of Care: Declined Electronic Signature(s) Signed: 06/05/2016 8:58:37 AM By: Gwenlyn Perking Entered By: Gwenlyn Perking on 06/05/2016 08:58:37 [name] ([id number]) -------------------------------------------------------------------------------- Lower Extremity Assessment Details Patient Name: [name] Date of Service: 06/05/2016 8:00 AM Medical Record Number: [id number] Patient Account Number: [id number] Date of Birth/Sex: Oct 12, 1945 (70 y.o. Male) Treating RN: Ashok Cordia, [name] Primary Care Physician: Other Clinician: Referring Physician: SELF, REFERRED Treating Physician/Extender: STONE III, HOYT Weeks in Treatment: 20 Edema Assessment Assessed: [Left: No] [Right: No] E[Left: dema] [Right: :] Calf Left: Right: Point of Measurement: 40 cm From Medial Instep cm 35.5 cm Ankle Left: Right: Point of Measurement: 12 cm From Medial Instep cm 23.6 cm Vascular Assessment Pulses: Posterior Tibial Dorsalis Pedis Palpable: [Right:Yes] Extremity colors, hair growth, and conditions: Extremity Color: [Right:Hyperpigmented] Temperature of Extremity: [Right:Warm] Capillary Refill: [Right:< 3 seconds] Toe Nail Assessment Left: Right: Thick: Yes Discolored: Yes Deformed: No Improper Length and Hygiene: No Electronic Signature(s) Signed: 06/06/2016 1:21:57 PM By: Alejandro Mulling Entered By: Alejandro Mulling on 06/05/2016 08:11:42 [name] ([id number]) -------------------------------------------------------------------------------- Multi Wound Chart Details Patient Name: [name] Date of Service: 06/05/2016 8:00 AM Medical Record Number: [id number] Patient Account Number: [id number] Date of Birth/Sex: 03-16-1946 (70 y.o.  Male) Treating RN: [name] Primary Care Physician: Other Clinician: Referring Physician: SELF, REFERRED Treating Physician/Extender: STONE III, HOYT Weeks in Treatment: 20 Vital Signs Height(in): 76 Pulse(bpm): 73 Weight(lbs): 238 Blood Pressure 131/77 (mmHg): Body Mass Index(BMI): 29 Temperature(F): 98.2 Respiratory Rate 18 (breaths/min): Photos: [1:No Photos] [2:No Photos] [N/A:N/A] Wound Location: [1:Right Malleolus - Lateral] [2:Right Malleolus - Medial] [N/A:N/A] Wounding Event: [1:Gradually Appeared] [2:Gradually Appeared] [N/A:N/A] Primary Etiology: [1:Venous Leg Ulcer] [2:Venous Leg Ulcer] [N/A:N/A] Comorbid History: [1:Cataracts, Chronic Obstructive Pulmonary Disease (COPD), Osteoarthritis] [2:Cataracts, Chronic Obstructive Pulmonary Disease (COPD), Osteoarthritis] [N/A:N/A] Date Acquired: [1:08/11/2015] [2:01/30/2016] [N/A:N/A] Weeks of Treatment: [1:20] [2:18] [N/A:N/A] Wound Status: [1:Open] [2:Open] [N/A:N/A] Clustered Wound: [1:No] [2:Yes] [N/A:N/A] Measurements L x  W x D 6x7.5x0.2 [2:4.4x3.4x0.2] [N/A:N/A] (cm) Area (cm) : [1:35.343] [2:11.75] [N/A:N/A] Volume (cm) : [1:7.069] [2:2.35] [N/A:N/A] % Reduction in Area: [1:-104.50%] [2:-1.80%] [N/A:N/A] % Reduction in Volume: -36.40% [2:-103.50%] [N/A:N/A] Classification: [1:Full Thickness Without Exposed Support Structures] [2:Full Thickness Without Exposed Support Structures] [N/A:N/A] Exudate Amount: [1:Large] [2:Large] [N/A:N/A] Exudate Type: [1:Serosanguineous] [2:Serosanguineous] [N/A:N/A] Exudate Color: [1:red, brown] [2:red, brown] [N/A:N/A] Foul Odor After [1:Yes] [2:No] [N/A:N/A] Cleansing: Odor Anticipated Due to No [2:N/A] [N/A:N/A] Product Use: Wound Margin: [1:Thickened] [2:Distinct, outline attached N/A] Granulation Amount: [1:Medium (34-66%)] [2:Medium (34-66%)] [N/A:N/A] Granulation Quality: Pink Pink N/A Necrotic Amount: Medium (34-66%) Medium (34-66%) N/A Exposed  Structures: Fascia: No Fascia: No N/A Fat: No Fat: No Tendon: No Tendon: No Muscle: No Muscle: No Joint: No Joint: No Bone: No Bone: No Limited to Skin Limited to Skin Breakdown Breakdown Epithelialization: Small (1-33%) Medium (34-66%) N/A Periwound Skin Texture: Scarring: Yes Scarring: Yes N/A Edema: No Edema: No Excoriation: No Excoriation: No Induration: No Induration: No Callus: No Callus: No Crepitus: No Crepitus: No Fluctuance: No Fluctuance: No Friable: No Friable: No Rash: No Rash: No Periwound Skin Maceration: Yes Maceration: Yes N/A Moisture: Moist: Yes Moist: Yes Dry/Scaly: No Dry/Scaly: No Periwound Skin Color: Atrophie Blanche: No Atrophie Blanche: No N/A Cyanosis: No Cyanosis: No Ecchymosis: No Ecchymosis: No Erythema: No Erythema: No Hemosiderin Staining: No Hemosiderin Staining: No Mottled: No Mottled: No Pallor: No Pallor: No Rubor: No Rubor: No Temperature: No Abnormality No Abnormality N/A Tenderness on No Yes N/A Palpation: Wound Preparation: Ulcer Cleansing: Other: Ulcer Cleansing: Other: N/A soap and water soap and water Topical Anesthetic Topical Anesthetic Applied: Other: lidocaine Applied: Other: lidocaine 4% 4% Treatment Notes Electronic Signature(s) Signed: 06/06/2016 1:21:57 PM By: Alejandro Mulling Entered By: Alejandro Mulling on 06/05/2016 08:19:35 [name] ([id number]) -------------------------------------------------------------------------------- Multi-Disciplinary Care Plan Details Patient Name: [name] Date of Service: 06/05/2016 8:00 AM Medical Record Number: [id number] Patient Account Number: [id number] Date of Birth/Sex: 04-14-1946 (70 y.o. Male) Treating RN: [name] Primary Care Physician: Other Clinician: Referring Physician: SELF, REFERRED Treating Physician/Extender: STONE III, HOYT Weeks in Treatment: 20 Active Inactive Venous Leg Ulcer Nursing  Diagnoses: Knowledge deficit related to disease process and management Goals: Patient will maintain optimal edema control Date Initiated: 04/03/2016 Goal Status: Active Interventions: Compression as ordered Treatment Activities: Therapeutic compression applied : 04/03/2016 Notes: Wound/Skin Impairment Nursing Diagnoses: Impaired tissue integrity Goals: Ulcer/skin breakdown will heal within 14 weeks Date Initiated: 01/17/2016 Goal Status: Active Interventions: Assess ulceration(s) every visit Notes: Electronic Signature(s) Signed: 06/06/2016 1:21:57 PM By: Alejandro Mulling Entered By: Alejandro Mulling on 06/05/2016 08:19:30 [name] ([name], [name] ([id number]) -------------------------------------------------------------------------------- Pain Assessment Details Patient Name: [name] Date of Service: 06/05/2016 8:00 AM Medical Record Number: [id number] Patient Account Number: [id number] Date of Birth/Sex: 08-08-1946 (70 y.o. Male) Treating RN: [name] Primary Care Physician: Other Clinician: Referring Physician: SELF, REFERRED Treating Physician/Extender: STONE III, HOYT Weeks in Treatment: 20 Active Problems Location of Pain Severity and Description of Pain Patient Has Paino No Site Locations With Dressing Change: No Pain Management and Medication Current Pain Management: Electronic Signature(s) Signed: 06/06/2016 1:21:57 PM By: Alejandro Mulling Entered By: Alejandro Mulling on 06/05/2016 08:02:45 [name] ([id number]) -------------------------------------------------------------------------------- Patient/Caregiver Education Details Patient Name: [name] Date of Service: 06/05/2016 8:00 AM Medical Record Number: [id number] Patient Account Number: [id number] Date of Birth/Gender: 1946-03-01 (70 y.o. Male) Treating RN: [name] Primary Care Physician: Other Clinician: Referring Physician:  SELF, REFERRED Treating Physician/Extender: STONE III, HOYT Weeks in  Treatment: 20 Education Assessment Education Provided To: Patient Education Topics Provided Wound/Skin Impairment: Handouts: Other: change dressing as ordered and do not get dressing wet Methods: Demonstration, Explain/Verbal Responses: State content correctly Electronic Signature(s) Signed: 06/06/2016 1:21:57 PM By: Alejandro Mulling Entered By: Alejandro Mulling on 06/05/2016 08:21:53 [name] ([id number]) -------------------------------------------------------------------------------- Wound Assessment Details Patient Name: [name] Date of Service: 06/05/2016 8:00 AM Medical Record Number: [id number] Patient Account Number: [id number] Date of Birth/Sex: [birth year] (70 y.o. Male) Treating RN: [name] Primary Care Physician: Other Clinician: Referring Physician: SELF, REFERRED Treating Physician/Extender: STONE III, HOYT Weeks in Treatment: 20 Wound Status Wound Number: 1 Primary Venous Leg Ulcer Etiology: Wound Location: Right Malleolus - Lateral Wound Open Wounding Event: Gradually Appeared Status: Date Acquired: 08/11/2015 Comorbid Cataracts, Chronic Obstructive Weeks Of Treatment: 20 History: Pulmonary Disease (COPD), Clustered Wound: No Osteoarthritis Photos Photo Uploaded By: Alejandro Mulling on 06/05/2016 08:23:39 Wound Measurements Length: (cm) 6 Width: (cm) 7.5 Depth: (cm) 0.2 Area: (cm) 35.343 Volume: (cm) 7.069 % Reduction in Area: -104.5% % Reduction in Volume: -36.4% Epithelialization: Small (1-33%) Tunneling: No Undermining: No Wound Description Full Thickness Without Exposed Foul Odor Afte Classification: Support Structures Due to Product Wound Margin: Thickened Exudate Large Amount: Exudate Type: Serosanguineous Exudate Color: red, brown r Cleansing: Yes Use: No Wound Bed Granulation Amount: Medium (34-66%) Exposed  Structure Granulation Quality: Pink Fascia Exposed: No Mcelrath, Davione C. ([id number]) Necrotic Amount: Medium (34-66%) Fat Layer Exposed: No Necrotic Quality: Adherent Slough Tendon Exposed: No Muscle Exposed: No Joint Exposed: No Bone Exposed: No Limited to Skin Breakdown Periwound Skin Texture Texture Color No Abnormalities Noted: No No Abnormalities Noted: No Callus: No Atrophie Blanche: No Crepitus: No Cyanosis: No Excoriation: No Ecchymosis: No Fluctuance: No Erythema: No Friable: No Hemosiderin Staining: No Induration: No Mottled: No Localized Edema: No Pallor: No Rash: No Rubor: No Scarring: Yes Temperature / Pain Moisture Temperature: No Abnormality No Abnormalities Noted: No Dry / Scaly: No Maceration: Yes Moist: Yes Wound Preparation Ulcer Cleansing: Other: soap and water, Topical Anesthetic Applied: Other: lidocaine 4%, Treatment Notes Wound #1 (Right, Lateral Malleolus) 1. Cleansed with: Clean wound with Normal Saline Cleanse wound with antibacterial soap and water 2. Anesthetic Topical Lidocaine 4% cream to wound bed prior to debridement 3. Peri-wound Care: Barrier cream Moisturizing lotion 4. Dressing Applied: Hydrafera Blue 5. Secondary Dressing Applied ABD Pad Dry Gauze 7. Secured with Tape THOMPSON, MCKIM ([id number]) 4-Layer Compression System - Right Lower Extremity Notes xtrasorb Electronic Signature(s) Signed: 06/06/2016 1:21:57 PM By: Alejandro Mulling Entered By: Alejandro Mulling on 06/05/2016 08:16:51 [name] ([id number]) -------------------------------------------------------------------------------- Wound Assessment Details Patient Name: [name] Date of Service: 06/05/2016 8:00 AM Medical Record Number: [id number] Patient Account Number: [id number] Date of Birth/Sex: May 08, 1946 (70 y.o. Male) Treating RN: [name] Primary Care Physician: Other Clinician: Referring Physician: SELF,  REFERRED Treating Physician/Extender: STONE III, HOYT Weeks in Treatment: 20 Wound Status Wound Number: 2 Primary Venous Leg Ulcer Etiology: Wound Location: Right Malleolus - Medial Wound Open Wounding Event: Gradually Appeared Status: Date Acquired: 01/30/2016 Comorbid Cataracts, Chronic Obstructive Weeks Of Treatment: 18 History: Pulmonary Disease (COPD), Clustered Wound: Yes Osteoarthritis Photos Photo Uploaded By: Alejandro Mulling on 06/05/2016 08:23:52 Wound Measurements Length: (cm) 4.4 Width: (cm) 3.4 Depth: (cm) 0.2 Area: (cm) 11.75 Volume: (cm) 2.35 % Reduction in Area: -1.8% % Reduction in Volume: -103.5% Epithelialization: Medium (34-66%) Tunneling: No Wound Description Full Thickness Without Exposed Classification: Support Structures Wound Margin: Distinct, outline attached Exudate Large Amount: Exudate Type: Serosanguineous Exudate  Color: red, brown Foul Odor After Cleansing: No Wound Bed Granulation Amount: Medium (34-66%) Exposed Structure Granulation Quality: Pink Fascia Exposed: No Millett, Haruki C. ([id number]) Necrotic Amount: Medium (34-66%) Fat Layer Exposed: No Necrotic Quality: Adherent Slough Tendon Exposed: No Muscle Exposed: No Joint Exposed: No Bone Exposed: No Limited to Skin Breakdown Periwound Skin Texture Texture Color No Abnormalities Noted: No No Abnormalities Noted: No Callus: No Atrophie Blanche: No Crepitus: No Cyanosis: No Excoriation: No Ecchymosis: No Fluctuance: No Erythema: No Friable: No Hemosiderin Staining: No Induration: No Mottled: No Localized Edema: No Pallor: No Rash: No Rubor: No Scarring: Yes Temperature / Pain Moisture Temperature: No Abnormality No Abnormalities Noted: No Tenderness on Palpation: Yes Dry / Scaly: No Maceration: Yes Moist: Yes Wound Preparation Ulcer Cleansing: Other: soap and water, Topical Anesthetic Applied: Other: lidocaine 4%, Treatment Notes Wound #2  (Right, Medial Malleolus) 1. Cleansed with: Clean wound with Normal Saline Cleanse wound with antibacterial soap and water 2. Anesthetic Topical Lidocaine 4% cream to wound bed prior to debridement 3. Peri-wound Care: Barrier cream Moisturizing lotion 4. Dressing Applied: Hydrafera Blue 5. Secondary Dressing Applied ABD Pad Dry Gauze 7. Secured with Tape [name], Roberta C. ([id number]) 4-Layer Compression System - Right Lower Extremity Notes xtrasorb Electronic Signature(s) Signed: 06/06/2016 1:21:57 PM By: Alejandro MullingPinkerton, Debra Entered By: Alejandro MullingPinkerton, Debra on 06/05/2016 08:17:48 [name], Willian C. ([id number]) -------------------------------------------------------------------------------- Vitals Details Patient Name: [name], Buddy C. Date of Service: 06/05/2016 8:00 AM Medical Record Number: [id number] Patient Account Number: [id number] Date of Birth/Sex: [birth year](70 y.o. Male) Treating RN: Phillis HaggisPinkerton, [name] Primary Care Physician: Other Clinician: Referring Physician: SELF, REFERRED Treating Physician/Extender: STONE III, HOYT Weeks in Treatment: 20 Vital Signs Time Taken: 08:02 Temperature (F): 98.2 Height (in): 76 Pulse (bpm): 73 Weight (lbs): 238 Respiratory Rate (breaths/min): 18 Body Mass Index (BMI): 29 Blood Pressure (mmHg): 131/77 Reference Range: 80 - 120 mg / dl Electronic Signature(s) Signed: 06/06/2016 1:21:57 PM By: Alejandro MullingPinkerton, Debra Entered By: Alejandro MullingPinkerton, Debra on 06/05/2016 08:04:33

## 2016-06-06 NOTE — Progress Notes (Addendum)
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 out was conducted at 08:31, prior to the start of the procedure. A Minimum amount of bleeding was controlled with Pressure. The procedure was tolerated well with a pain level of 0 throughout and a pain level of 0 following the procedure. Post Debridement Measurements: 6cm length x 7.5cm width x 0.25cm depth; 8.836cm^3 volume. Character of Wound/Ulcer Post Debridement requires further debridement. Severity of Tissue Post Debridement is: Fat layer exposed. Post procedure Diagnosis Wound #1: Same as Pre-Procedure Wound #2 Wound #2 is a Venous Leg Ulcer located on the Right,Medial Malleolus . There was a Skin/Subcutaneous Tissue Debridement ([id number]) debridement with total area of 14.96 sq cm performed by STONE III, HOYT. with the following instrument(s): Curette to remove Viable and Non-Viable tissue/material including Exudate, Fibrin/Slough, and Subcutaneous after achieving pain control using Lidocaine 4%  Topical Solution. A time out was conducted at 08:31, prior to the start of the procedure. A Minimum amount of bleeding was controlled with Pressure. The procedure was tolerated well with a pain level of 0 throughout and a pain level of 0 following the procedure. Post Debridement Measurements: 4.4cm length x 3.4cm width x 0.25cm depth; 2.937cm^3 volume. Character of Wound/Ulcer Post Debridement requires further debridement. Severity of Tissue Post Debridement is: Fat layer exposed. Post procedure Diagnosis Wound #2: Same as Pre-Procedure Plan Wound Cleansing: Wound #1 Right,Lateral Malleolus: Cleanse wound with mild soap and water May shower with protection. No tub bath. Wound #2 Right,Medial Malleolus: Cleanse wound with mild soap and water May shower with protection. No tub bath. Anesthetic: Wound #1 Right,Lateral Malleolus: Topical Lidocaine 4% cream applied to wound bed prior to debridement Wound #2 Right,Medial Malleolus: Topical Lidocaine 4% cream applied to wound bed prior to debridement Skin Barriers/Peri-Wound Care: [name], [name] ([id number]) Wound #1 Right,Lateral Malleolus: Barrier cream - zinc oxide Moisturizing lotion Wound #2 Right,Medial Malleolus: Barrier cream - zinc oxide Moisturizing lotion Primary Wound Dressing: Wound #1 Right,Lateral Malleolus: Hydrafera Blue Wound #2 Right,Medial Malleolus: Hydrafera Blue Secondary Dressing: Wound #1 Right,Lateral Malleolus: ABD pad Dry Gauze XtraSorb - large pad Wound #2 Right,Medial Malleolus: ABD pad Dry Gauze XtraSorb - large pad Dressing Change Frequency: Wound #1 Right,Lateral Malleolus: Other: - Nurse Visit Friday Follow-up Appointments: Wound #1 Right,Lateral Malleolus: Return Appointment in 1 week. Nurse Visit as needed - Friday Wound #2 Right,Medial Malleolus: Return Appointment in 1 week. Nurse Visit as needed - Friday Edema Control: Wound #1 Right,Lateral Malleolus: 4-Layer Compression  System - Right Lower Extremity - UNNA TO ANCHOR Wound #2 Right,Medial Malleolus: 4-Layer Compression System - Right Lower Extremity - UNNA TO ANCHOR Additional Orders / Instructions: Wound #1 Right,Lateral Malleolus: Increase protein intake. OK to return to work with the following restrictions: Activity as tolerated Wound #2 Right,Medial Malleolus: Increase protein intake. OK to return to work with the following restrictions: Activity as tolerated Follow-Up Appointments: A follow-up appointment should be scheduled. [name], [name] ([id number]) Medication Reconciliation completed and provided to Patient/Care Provider. At this point in time patient continues to see improvement regarding his wounds. I was able to debride the wounds today of the bimalleolar surfaces which include the removal of slough and devitalized subcutaneous tissue. Patient tolerated this well today without complication or significant pain bleeding was noted that hemostasis controlled with pressure. I am going to suggest that we go ahead and continue with the 2 layer compression wrapping along with the Hydrofera Blue dressing. This seems to be benefiting him currently. Otherwise I I recommended he continue with activity as tolerated and  that walking can also help as far as lower extremity swelling is concerned. He understands. If anything changes he will contact the office and let us know otherwise we will see him for a follow-up evaluation in one week to see where things stand at that point in time. Electronic Signature(s) Signed: 06/20/2016 4:20:21 PM By: Lenda Kelp PA-C Previous Signature: 06/06/2016 9:39:47 AM Version By: Lenda Kelp PA-C Entered By: Lenda Kelp on 06/20/2016 16:20:21 [name] ([id number]) -------------------------------------------------------------------------------- SuperBill Details Patient Name: [name] Date of Service: 06/05/2016 Medical Record  Number: [id number] Patient Account Number: [id number] Date of Birth/Sex: 06/27/1946 (70 y.o. 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, initial T85.613A encounter Facility Procedures CPT4 Code Description: 78295621 11042 - DEB SUBQ TISSUE 20 SQ CM/< ICD-10 Description Diagnosis L97.313 Non-pressure chronic ulcer of right ankle with necros Modifier: is of muscle Quantity: 1 CPT4 Code Description: [id number] 11045 - DEB SUBQ TISS EA ADDL 20CM ICD-10 Description Diagnosis L97.313 Non-pressure chronic ulcer of right ankle with necros Modifier: is of muscle Quantity: 2 Physician Procedures CPT4 Code Description: [id number] 11042 - WC PHYS SUBQ TISS 20 SQ CM ICD-10 Description Diagnosis L97.313 Non-pressure chronic ulcer of right ankle with necros Modifier: is of muscle Quantity: 1 CPT4 Code Description: [id number] 11045 - WC PHYS SUBQ TISS EA ADDL 20 CM ICD-10 Description Diagnosis L97.313 Non-pressure chronic ulcer of right ankle with necros Modifier: is of muscle Quantity: 2 Electronic Signature(s) [name], [name] ([id number]) Signed: 06/06/2016 9:39:47 AM By: Lenda Kelp PA-C Entered By: Lenda Kelp on 06/05/2016 08:56:41

## 2016-06-07 NOTE — Progress Notes (Signed)
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 ability during the office visit  today. Electronic Signature(s) Signed: 06/05/2016 1:46:02 PM By: [name] Gurney RN, BSN, Kim RN, BSN Signed: 06/06/2016 4:52:13 PM By: [name] Previous Signature: 05/31/2016 1:37:32 AM Version By: [name] Entered By: [name] Gurney RN, BSN, [name] on 06/05/2016 13:22:33 [name] ([id number]) -------------------------------------------------------------------------------- SuperBill Details Patient Name: [name] Date of Service: 05/29/2016 Medical Record Number: [id number] Patient Account Number: [id number] Date of Birth/Sex: 1946-05-19 (70 y.o. 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

## 2016-06-07 NOTE — Progress Notes (Signed)
MASSIMO, HARTLAND ([id number]) Visit Report for 05/29/2016 Arrival Information Details Patient Name: [name], [name]. Date of Service: 05/29/2016 8:00 AM Medical Record Number: [id number] Patient Account Number: [id number] Date of Birth/Sex: [birth year] (70 y.o. Male) Treating RN: Huel Coventry Primary Care Physician: Other Clinician: Referring Physician: SELF, REFERRED Treating Physician/Extender: Linwood Dibbles, HOYT Weeks in Treatment: 19 Visit Information History Since Last Visit Added or deleted any medications: No Patient Arrived: Ambulatory Any new allergies or adverse reactions: No Arrival Time: 08:06 Had a fall or experienced change in No Accompanied By: self activities of daily living that may affect Transfer Assistance: None risk of falls: Patient Identification Verified: Yes Signs or symptoms of abuse/neglect since last No Secondary Verification Process Yes visito Completed: Hospitalized since last visit: No Patient Requires Transmission- No Has Dressing in Place as Prescribed: Yes Based Precautions: Pain Present Now: No Patient Has Alerts: Yes Patient Alerts: Patient on Blood Thinner Electronic Signature(s) Signed: 06/01/2016 4:52:48 PM By: Elliot Gurney, RN, BSN, Kim RN, BSN Previous Signature: 05/31/2016 4:28:52 PM Version By: Elliot Gurney, RN, BSN, Kim RN, BSN Entered By: Elliot Gurney, RN, BSN, Kim on 06/01/2016 16:46:01 [name] ([id number]) -------------------------------------------------------------------------------- Encounter Discharge Information Details Patient Name: [name] Date of Service: 05/29/2016 8:00 AM Medical Record Number: [id number] Patient Account Number: [id number] Date of Birth/Sex: Nov 18, 1945 (70 y.o. Male) Treating RN: Huel Coventry Primary Care Physician: Other Clinician: Referring Physician: SELF, REFERRED Treating Physician/Extender: Linwood Dibbles, HOYT Weeks in Treatment: 19 Encounter Discharge Information Items Discharge Pain Level:  0 Discharge Condition: Stable Ambulatory Status: Ambulatory Discharge Destination: Home Private Transportation: Auto Accompanied By: self Schedule Follow-up Appointment: Yes Medication Reconciliation completed and Yes provided to Patient/Care Virgilia Quigg: Clinical Summary of Care: Electronic Signature(s) Signed: 06/01/2016 4:52:48 PM By: Elliot Gurney, RN, BSN, Kim RN, BSN Previous Signature: 05/31/2016 4:28:52 PM Version By: Elliot Gurney, RN, BSN, Kim RN, BSN Entered By: Elliot Gurney, RN, BSN, Kim on 06/01/2016 16:47:37 [name] ([id number]) -------------------------------------------------------------------------------- Lower Extremity Assessment Details Patient Name: [name] Date of Service: 05/29/2016 8:00 AM Medical Record Number: [id number] Patient Account Number: [id number] Date of Birth/Sex: 1946/04/04 (70 y.o. Male) Treating RN: Huel Coventry Primary Care Physician: Other Clinician: Referring Physician: SELF, REFERRED Treating Physician/Extender: Linwood Dibbles, HOYT Weeks in Treatment: 19 Edema Assessment Assessed: [Left: No] [Right: No] E[Left: dema] [Right: :] Calf Left: Right: Point of Measurement: 40 cm From Medial Instep cm 35 cm Ankle Left: Right: Point of Measurement: 12 cm From Medial Instep cm 25.9 cm Vascular Assessment Pulses: Posterior Tibial Dorsalis Pedis Palpable: [Right:Yes] Extremity colors, hair growth, and conditions: Extremity Color: [Right:Hyperpigmented] Hair Growth on Extremity: [Right:No] Temperature of Extremity: [Right:Warm] Capillary Refill: [Right:< 3 seconds] Dependent Rubor: [Right:No] Blanched when Elevated: [Right:No] Lipodermatosclerosis: [Right:No] Toe Nail Assessment Left: Right: Thick: Yes Discolored: Yes Deformed: No Improper Length and Hygiene: No Electronic Signature(s) Signed: 06/01/2016 4:52:48 PM By: Elliot Gurney, RN, BSN, Kim RN, BSN Previous Signature: 05/31/2016 4:28:52 PM Version By: Elliot Gurney RN, BSN, Kim RN, BSN [place],  [place] ([id number]) Entered By: Elliot Gurney, RN, BSN, Kim on 06/01/2016 16:46:24 [name] ([id number]) -------------------------------------------------------------------------------- Multi Wound Chart Details Patient Name: [name] Date of Service: 05/29/2016 8:00 AM Medical Record Number: [id number] Patient Account Number: [id number] Date of Birth/Sex: June 18, 1946 (70 y.o. Male) Treating RN: Huel Coventry Primary Care Physician: Other Clinician: Referring Physician: SELF, REFERRED Treating Physician/Extender: STONE III, HOYT Weeks in Treatment: 19 Vital Signs Height(in): 76 Pulse(bpm): 72 Weight(lbs): 238 Blood Pressure 141/65 (mmHg): Body Mass Index(BMI): 29 Temperature(F): 98.2 Respiratory Rate 16 (breaths/min): Photos: [N/A:N/A]  Wound Location: Right Malleolus - Lateral Right Malleolus - Medial N/A Wounding Event: Gradually Appeared Gradually Appeared N/A Primary Etiology: Venous Leg Ulcer Venous Leg Ulcer N/A Comorbid History: Cataracts, Chronic Cataracts, Chronic N/A Obstructive Pulmonary Obstructive Pulmonary Disease (COPD), Disease (COPD), Osteoarthritis Osteoarthritis Date Acquired: 08/11/2015 01/30/2016 N/A Weeks of Treatment: 19 17 N/A Wound Status: Open Open N/A Clustered Wound: No Yes N/A Measurements L x W x D 5x3x0.2 6.5x3.6x0.1 N/A (cm) Area (cm) : 11.781 18.378 N/A Volume (cm) : 2.356 1.838 N/A % Reduction in Area: 31.80% -59.20% N/A % Reduction in Volume: 54.60% -59.10% N/A Classification: Full Thickness Without Full Thickness Without N/A Exposed Support Exposed Support Structures Structures Exudate Amount: Large Large N/A Exudate Type: Serosanguineous Serosanguineous N/A Exudate Color: red, brown red, brown N/A Wound Margin: Thickened Distinct, outline attached N/A [name], DECOSTE. ([id number]) Granulation Amount: Medium (34-66%) Medium (34-66%) N/A Granulation Quality: Pink Pink N/A Necrotic Amount: Medium (34-66%) Medium  (34-66%) N/A Exposed Structures: Fascia: No Fascia: No N/A Fat: No Fat: No Tendon: No Tendon: No Muscle: No Muscle: No Joint: No Joint: No Bone: No Bone: No Limited to Skin Limited to Skin Breakdown Breakdown Epithelialization: Small (1-33%) Medium (34-66%) N/A Debridement: Debridement (40981- N/A N/A 19147) Pre-procedure 08:25 N/A N/A Verification/Time Out Taken: Pain Control: Other N/A N/A Tissue Debrided: Fibrin/Slough, Exudates, N/A N/A Subcutaneous Level: Skin/Subcutaneous N/A N/A Tissue Debridement Area (sq 15 N/A N/A cm): Instrument: Curette N/A N/A Bleeding: Minimum N/A N/A Hemostasis Achieved: Pressure N/A N/A Procedural Pain: 0 N/A N/A Post Procedural Pain: 0 N/A N/A Debridement Treatment Procedure was tolerated N/A N/A Response: well Post Debridement 5x3x0.25 N/A N/A Measurements L x W x D (cm) Post Debridement 2.945 N/A N/A Volume: (cm) Periwound Skin Texture: Scarring: Yes Scarring: Yes N/A Edema: No Edema: No Excoriation: No Excoriation: No Induration: No Induration: No Callus: No Callus: No Crepitus: No Crepitus: No Fluctuance: No Fluctuance: No Friable: No Friable: No Rash: No Rash: No Periwound Skin Maceration: Yes Maceration: Yes N/A Moisture: Moist: Yes Moist: Yes Dry/Scaly: No Dry/Scaly: No Periwound Skin Color: Atrophie Blanche: No Atrophie Blanche: No N/A Cyanosis: No Cyanosis: No [name], [name] ([id number]) Ecchymosis: No Ecchymosis: No Erythema: No Erythema: No Hemosiderin Staining: No Hemosiderin Staining: No Mottled: No Mottled: No Pallor: No Pallor: No Rubor: No Rubor: No Temperature: No Abnormality No Abnormality N/A Tenderness on Yes Yes N/A Palpation: Wound Preparation: Ulcer Cleansing: Other: Ulcer Cleansing: Other: N/A soap and water soap and water Topical Anesthetic Topical Anesthetic Applied: Other: lidocaine Applied: Other: lidocaine 4% 4% Procedures Performed: Debridement N/A  N/A Treatment Notes Wound #1 (Right, Lateral Malleolus) 1. Cleansed with: Cleanse wound with antibacterial soap and water 2. Anesthetic Topical Lidocaine 4% cream to wound bed prior to debridement 3. Peri-wound Care: Barrier cream 4. Dressing Applied: Hydrafera Blue 5. Secondary Dressing Applied ABD Pad 7. Secured with Tape 4-Layer Compression System - Right Lower Extremity Notes xtrasorb, unna to anchor Wound #2 (Right, Medial Malleolus) 1. Cleansed with: Cleanse wound with antibacterial soap and water 2. Anesthetic Topical Lidocaine 4% cream to wound bed prior to debridement 3. Peri-wound Care: Barrier cream 4. Dressing Applied: Hydrafera Blue 5. Secondary Dressing Applied ABD Pad 7. Secured with [name], [name] ([id number]) Tape 4-Layer Compression System - Right Lower Extremity Notes xtrasorb, unna to Ecologist) Signed: 06/01/2016 4:52:48 PM By: Elliot Gurney, RN, BSN, Kim RN, BSN Previous Signature: 05/31/2016 4:28:52 PM Version By: Elliot Gurney, RN, BSN, Kim RN, BSN Entered By: Elliot Gurney, RN, BSN, Kim on 06/01/2016 16:46:58 [name], [name] (  [id number]) -------------------------------------------------------------------------------- Multi-Disciplinary Care Plan Details Patient Name: [name], [name] Date of Service: 05/29/2016 8:00 AM Medical Record Number: [id number] Patient Account Number: [id number] Date of Birth/Sex: March 07, 1946 [age] y.o. Male) Treating RN: Huel CoventryWoody, Kim Primary Care Physician: Other Clinician: Referring Physician: SELF, REFERRED Treating Physician/Extender: Linwood DibblesSTONE III, HOYT Weeks in Treatment: 19 Active Inactive Venous Leg Ulcer Nursing Diagnoses: Knowledge deficit related to disease process and management Goals: Patient will maintain optimal edema control Date Initiated: 04/03/2016 Goal Status: Active Interventions: Compression as ordered Treatment Activities: Therapeutic compression applied : 04/03/2016 Notes: Wound/Skin  Impairment Nursing Diagnoses: Impaired tissue integrity Goals: Ulcer/skin breakdown will heal within 14 weeks Date Initiated: 01/17/2016 Goal Status: Active Interventions: Assess ulceration(s) every visit Notes: Electronic Signature(s) Signed: 06/01/2016 4:52:48 PM By: Elliot GurneyWoody, RN, BSN, Kim RN, BSN Previous Signature: 05/31/2016 4:28:52 PM Version By: Elliot GurneyWoody, RN, BSN, Kim RN, BSN Entered By: Elliot GurneyWoody, RN, BSN, Kim on 06/01/2016 16:46:37 [name], [name] ([name]) [name], [name] ([id number]) -------------------------------------------------------------------------------- Pain Assessment Details Patient Name: [name], [name] Date of Service: 05/29/2016 8:00 AM Medical Record Number: [id number] Patient Account Number: [id number] Date of Birth/Sex: March 07, 1946 [age] y.o. Male) Treating RN: Huel CoventryWoody, Kim Primary Care Physician: Other Clinician: Referring Physician: SELF, REFERRED Treating Physician/Extender: Linwood DibblesSTONE III, HOYT Weeks in Treatment: 19 Active Problems Location of Pain Severity and Description of Pain Patient Has Paino No Site Locations With Dressing Change: No Pain Management and Medication Current Pain Management: Electronic Signature(s) Signed: 06/01/2016 4:52:48 PM By: Elliot GurneyWoody, RN, BSN, Kim RN, BSN Previous Signature: 05/31/2016 4:28:52 PM Version By: Elliot GurneyWoody, RN, BSN, Kim RN, BSN Entered By: Elliot GurneyWoody, RN, BSN, Kim on 06/01/2016 16:46:08 [name], Deunte C. ([id number]) -------------------------------------------------------------------------------- Patient/Caregiver Education Details Patient Name: [name], Jamaar C. Date of Service: 05/29/2016 8:00 AM Medical Record Number: [id number] Patient Account Number: [id number] Date of Birth/Gender: March 07, 1946 [age] y.o. Male) Treating RN: Huel CoventryWoody, Kim Primary Care Physician: Other Clinician: Referring Physician: SELF, REFERRED Treating Physician/Extender: Lenda KelpSTONE III, HOYT Weeks in Treatment: 19 Education Assessment Education  Provided To: Patient Education Topics Provided Wound/Skin Impairment: Handouts: Caring for Your Ulcer Methods: Demonstration Responses: State content correctly Nash-Finch CompanyElectronic Signature(s) Signed: 06/01/2016 4:52:48 PM By: Elliot GurneyWoody, RN, BSN, Kim RN, BSN Previous Signature: 05/31/2016 4:28:52 PM Version By: Elliot GurneyWoody, RN, BSN, Kim RN, BSN Entered By: Elliot GurneyWoody, RN, BSN, Kim on 06/01/2016 16:47:45 [name], Ola C. ([id number]) -------------------------------------------------------------------------------- Wound Assessment Details Patient Name: [name], Breckan C. Date of Service: 05/29/2016 8:00 AM Medical Record Number: [id number] Patient Account Number: [id number] Date of Birth/Sex: March 07, 1946 [age] y.o. Male) Treating RN: Huel CoventryWoody, Kim Primary Care Physician: Other Clinician: Referring Physician: SELF, REFERRED Treating Physician/Extender: Eugene GarnetSaunders, Sharon Weeks in Treatment: 19 Wound Status Wound Number: 1 Primary Venous Leg Ulcer Etiology: Wound Location: Right Malleolus - Lateral Wound Open Wounding Event: Gradually Appeared Status: Date Acquired: 08/11/2015 Comorbid Cataracts, Chronic Obstructive Weeks Of Treatment: 19 History: Pulmonary Disease (COPD), Clustered Wound: No Osteoarthritis Photos Wound Measurements Length: (cm) 5 Width: (cm) 3 Depth: (cm) 0.2 Area: (cm) 11.781 Volume: (cm) 2.356 % Reduction in Area: 31.8% % Reduction in Volume: 54.6% Epithelialization: Small (1-33%) Tunneling: No Undermining: No Wound Description Full Thickness Without Exposed Classification: Support Structures Wound Margin: Thickened Exudate Large Amount: Exudate Type: Serosanguineous Exudate Color: red, brown Foul Odor After Cleansing: No Wound Bed Granulation Amount: Medium (34-66%) Exposed Structure Granulation Quality: Pink Fascia Exposed: No Necrotic Amount: Medium (34-66%) Fat Layer Exposed: No Necrotic Quality: Adherent Slough Tendon Exposed: No Muscle Exposed:  No Ilyas, Jovonta C. ([id number]) Joint Exposed: No Bone Exposed: No Limited to Skin Breakdown Periwound  Skin Texture Texture Color No Abnormalities Noted: No No Abnormalities Noted: No Callus: No Atrophie Blanche: No Crepitus: No Cyanosis: No Excoriation: No Ecchymosis: No Fluctuance: No Erythema: No Friable: No Hemosiderin Staining: No Induration: No Mottled: No Localized Edema: No Pallor: No Rash: No Rubor: No Scarring: Yes Temperature / Pain Moisture Temperature: No Abnormality No Abnormalities Noted: No Tenderness on Palpation: Yes Dry / Scaly: No Maceration: Yes Moist: Yes Wound Preparation Ulcer Cleansing: Other: soap and water, Topical Anesthetic Applied: Other: lidocaine 4%, Electronic Signature(s) Signed: 05/31/2016 4:28:52 PM By: Elliot Gurney, RN, BSN, Kim RN, BSN Entered By: Elliot Gurney, RN, BSN, Kim on 05/29/2016 08:20:41 [name] ([id number]) -------------------------------------------------------------------------------- Wound Assessment Details Patient Name: [name] Date of Service: 05/29/2016 8:00 AM Medical Record Number: [id number] Patient Account Number: [id number] Date of Birth/Sex: September 05, 1945 (70 y.o. Male) Treating RN: Huel Coventry Primary Care Physician: Other Clinician: Referring Physician: SELF, REFERRED Treating Physician/Extender: Eugene Garnet in Treatment: 19 Wound Status Wound Number: 2 Primary Venous Leg Ulcer Etiology: Wound Location: Right Malleolus - Medial Wound Open Wounding Event: Gradually Appeared Status: Date Acquired: 01/30/2016 Comorbid Cataracts, Chronic Obstructive Weeks Of Treatment: 17 History: Pulmonary Disease (COPD), Clustered Wound: Yes Osteoarthritis Photos Wound Measurements Length: (cm) 6.5 Width: (cm) 3.6 Depth: (cm) 0.1 Area: (cm) 18.378 Volume: (cm) 1.838 % Reduction in Area: -59.2% % Reduction in Volume: -59.1% Epithelialization: Medium (34-66%) Tunneling:  No Undermining: No Wound Description Full Thickness Without Exposed Classification: Support Structures Wound Margin: Distinct, outline attached Exudate Large Amount: Exudate Type: Serosanguineous Exudate Color: red, brown Foul Odor After Cleansing: No Wound Bed Granulation Amount: Medium (34-66%) Exposed Structure Granulation Quality: Pink Fascia Exposed: No Necrotic Amount: Medium (34-66%) Fat Layer Exposed: No Necrotic Quality: Adherent Slough Tendon Exposed: No Muscle Exposed: No Penninger, Jelani C. ([id number]) Joint Exposed: No Bone Exposed: No Limited to Skin Breakdown Periwound Skin Texture Texture Color No Abnormalities Noted: No No Abnormalities Noted: No Callus: No Atrophie Blanche: No Crepitus: No Cyanosis: No Excoriation: No Ecchymosis: No Fluctuance: No Erythema: No Friable: No Hemosiderin Staining: No Induration: No Mottled: No Localized Edema: No Pallor: No Rash: No Rubor: No Scarring: Yes Temperature / Pain Moisture Temperature: No Abnormality No Abnormalities Noted: No Tenderness on Palpation: Yes Dry / Scaly: No Maceration: Yes Moist: Yes Wound Preparation Ulcer Cleansing: Other: soap and water, Topical Anesthetic Applied: Other: lidocaine 4%, Electronic Signature(s) Signed: 05/31/2016 4:28:52 PM By: Elliot Gurney, RN, BSN, Kim RN, BSN Entered By: Elliot Gurney, RN, BSN, Kim on 05/29/2016 08:21:14 [name] ([id number]) -------------------------------------------------------------------------------- Vitals Details Patient Name: [name] Date of Service: 05/29/2016 8:00 AM Medical Record Number: [id number] Patient Account Number: [id number] Date of Birth/Sex: Dec 14, 1945 (70 y.o. Male) Treating RN: Huel Coventry Primary Care Physician: Other Clinician: Referring Physician: SELF, REFERRED Treating Physician/Extender: STONE III, HOYT Weeks in Treatment: 19 Vital Signs Time Taken: 08:11 Temperature (F): 98.2 Height (in):  76 Pulse (bpm): 72 Weight (lbs): 238 Respiratory Rate (breaths/min): 16 Body Mass Index (BMI): 29 Blood Pressure (mmHg): 141/65 Reference Range: 80 - 120 mg / dl Electronic Signature(s) Signed: 06/01/2016 4:52:48 PM By: Elliot Gurney, RN, BSN, Kim RN, BSN Previous Signature: 05/31/2016 4:28:52 PM Version By: Elliot Gurney, RN, BSN, Kim RN, BSN Entered By: Elliot Gurney, RN, BSN, Kim on 06/01/2016 16:46:16

## 2016-06-08 DIAGNOSIS — I87331 Chronic venous hypertension (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: Secondary | ICD-10-CM | POA: Diagnosis not present

## 2016-06-09 NOTE — Progress Notes (Addendum)
CEM, KOSMAN ([id number]) Visit Report for 06/08/2016 Arrival Information Details Patient Name: [name], [name]. Date of Service: 06/08/2016 3:00 PM Medical Record Number: [id number] Patient Account Number: [id number] Date of Birth/Sex: [birth year] (70 y.o. Male) Treating RN: [name], RN, BSN, [place] Sink Primary Care Physician: Other Clinician: Referring Physician: SELF, REFERRED Treating Physician/Extender: Eugene Garnet in Treatment: 20 Visit Information History Since Last Visit All ordered tests and consults were completed: No Patient Arrived: Ambulatory Added or deleted any medications: No Arrival Time: 14:49 Any new allergies or adverse reactions: No Accompanied By: self Had a fall or experienced change in No Transfer Assistance: None activities of daily living that may affect Patient Identification Verified: Yes risk of falls: Secondary Verification Process Yes Signs or symptoms of abuse/neglect since last No Completed: visito Patient Requires Transmission-Based No Hospitalized since last visit: No Precautions: Has Dressing in Place as Prescribed: Yes Patient Has Alerts: No Has Compression in Place as Prescribed: Yes Pain Present Now: No Electronic Signature(s) Signed: 06/08/2016 2:49:41 PM By: [name] BSN, RN Entered By: [name] on 06/08/2016 14:49:41 [name] ([id number]) -------------------------------------------------------------------------------- Encounter Discharge Information Details Patient Name: [name] Date of Service: 06/08/2016 3:00 PM Medical Record Number: [id number] Patient Account Number: [id number] Date of Birth/Sex: 08-04-1946 (70 y.o. Male) Treating RN: [name], RN, BSN, [place] Sink Primary Care Physician: Other Clinician: Referring Physician: SELF, REFERRED Treating Physician/Extender: Eugene Garnet in Treatment: 20 Encounter Discharge Information Items Discharge Pain Level: 0 Discharge Condition:  Stable Ambulatory Status: Ambulatory Discharge Destination: Home Private Transportation: Auto Accompanied By: self Schedule Follow-up Appointment: No Medication Reconciliation completed and No provided to Patient/Care Rhaya Coale: Clinical Summary of Care: Electronic Signature(s) Signed: 06/08/2016 2:51:54 PM By: [name] BSN, RN Entered By: [name] on 06/08/2016 14:51:53 [name] ([id number]) -------------------------------------------------------------------------------- Patient/Caregiver Education Details Patient Name: [name] Date of Service: 06/08/2016 3:00 PM Medical Record Number: [id number] Patient Account Number: [id number] Date of Birth/Gender: [birth year] (70 y.o. Male) Treating RN: [name], RN, BSN, [place] Sink Primary Care Physician: Other Clinician: Referring Physician: Seymour Bars, REFERRED Treating Physician/Extender: Eugene Garnet in Treatment: 20 Education Assessment Education Provided To: Patient Education Topics Provided Electronic Signature(s) Signed: 06/08/2016 4:35:15 PM By: [name] BSN, RN Entered By: [name] on 06/08/2016 14:51:40 [name] ([id number]) -------------------------------------------------------------------------------- Wound Assessment Details Patient Name: [name] Date of Service: 06/08/2016 3:00 PM Medical Record Number: [id number] Patient Account Number: [id number] Date of Birth/Sex: 08-Dec-1945 (70 y.o. Male) Treating RN: [name], RN, BSN, Rita Primary Care Physician: Other Clinician: Referring Physician: SELF, REFERRED Treating Physician/Extender: Eugene Garnet in Treatment: 20 Wound Status Wound Number: 1 Primary Venous Leg Ulcer Etiology: Wound Location: Right Malleolus - Lateral Wound Open Wounding Event: Gradually Appeared Status: Date Acquired: 08/11/2015 Comorbid Cataracts, Chronic Obstructive Weeks Of Treatment: 20 History: Pulmonary Disease  (COPD), Clustered Wound: No Osteoarthritis Wound Measurements Length: (cm) 6 Width: (cm) 7.5 Depth: (cm) 0.2 Area: (cm) 35.343 Volume: (cm) 7.069 % Reduction in Area: -104.5% % Reduction in Volume: -36.4% Epithelialization: Small (1-33%) Tunneling: No Undermining: No Wound Description Full Thickness Without Exposed Classification: Support Structures Wound Margin: Thickened Exudate Large Amount: Exudate Type: Serosanguineous Exudate Color: red, brown Foul Odor After Cleansing: Yes Due to Product Use: No Wound Bed Granulation Amount: Medium (34-66%) Exposed Structure Granulation Quality: Pink Fascia Exposed: No Necrotic Amount: Medium (34-66%) Fat Layer Exposed: No Necrotic Quality: Adherent Slough Tendon Exposed: No Muscle Exposed: No Joint Exposed: No Bone Exposed: No Limited to Skin Breakdown Periwound Skin Texture Texture Color No  Abnormalities Noted: No No Abnormalities Noted: No Callus: No Atrophie Blanche: No [name] StackBLACKWELL, Quintavious C. ([id number]) Crepitus: No Cyanosis: No Excoriation: No Ecchymosis: No Fluctuance: No Erythema: No Friable: No Hemosiderin Staining: No Induration: No Mottled: No Localized Edema: No Pallor: No Rash: No Rubor: No Scarring: Yes Temperature / Pain Moisture Temperature: No Abnormality No Abnormalities Noted: No Dry / Scaly: No Maceration: Yes Moist: Yes Wound Preparation Ulcer Cleansing: Other: soap and water, Topical Anesthetic Applied: None Treatment Notes Wound #1 (Right, Lateral Malleolus) 1. Cleansed with: Clean wound with Normal Saline Cleanse wound with antibacterial soap and water 2. Anesthetic Topical Lidocaine 4% cream to wound bed prior to debridement 3. Peri-wound Care: Barrier cream Moisturizing lotion 4. Dressing Applied: Hydrafera Blue 5. Secondary Dressing Applied ABD Pad Dry Gauze 7. Secured with Tape 4-Layer Compression System - Right Lower Extremity Notes xtrasorb Electronic  Signature(s) Signed: 06/08/2016 4:35:15 PM By: Elpidio EricAfful, Rita BSN, RN Entered By: Elpidio EricAfful, Rita on 06/08/2016 14:50:31 [name] StackBLACKWELL, Alen C. ([id number]) -------------------------------------------------------------------------------- Wound Assessment Details Patient Name: [name] StackBLACKWELL, Edsel C. Date of Service: 06/08/2016 3:00 PM Medical Record Number: [id number] Patient Account Number: [id number] Date of Birth/Sex: [birth year](70 y.o. Male) Treating RN: Clover MealyAfful, RN, BSN, Rita Primary Care Physician: Other Clinician: Referring Physician: SELF, REFERRED Treating Physician/Extender: Eugene GarnetSaunders, Sharon Weeks in Treatment: 20 Wound Status Wound Number: 2 Primary Venous Leg Ulcer Etiology: Wound Location: Right Malleolus - Medial Wound Open Wounding Event: Gradually Appeared Status: Date Acquired: 01/30/2016 Comorbid Cataracts, Chronic Obstructive Weeks Of Treatment: 18 History: Pulmonary Disease (COPD), Clustered Wound: Yes Osteoarthritis Wound Measurements Length: (cm) 4.4 Width: (cm) 3.4 Depth: (cm) 0.2 Area: (cm) 11.75 Volume: (cm) 2.35 % Reduction in Area: -1.8% % Reduction in Volume: -103.5% Epithelialization: Medium (34-66%) Tunneling: No Undermining: No Wound Description Full Thickness Without Exposed Classification: Support Structures Wound Margin: Distinct, outline attached Exudate Large Amount: Exudate Type: Serosanguineous Exudate Color: red, brown Foul Odor After Cleansing: No Wound Bed Granulation Amount: Medium (34-66%) Exposed Structure Granulation Quality: Pink Fascia Exposed: No Necrotic Amount: Medium (34-66%) Fat Layer Exposed: No Necrotic Quality: Adherent Slough Tendon Exposed: No Muscle Exposed: No Joint Exposed: No Bone Exposed: No Limited to Skin Breakdown Periwound Skin Texture Texture Color No Abnormalities Noted: No No Abnormalities Noted: No Callus: No Atrophie Blanche: No Wailes, Lark C. ([id number]) Crepitus: No Cyanosis:  No Excoriation: No Ecchymosis: No Fluctuance: No Erythema: No Friable: No Hemosiderin Staining: No Induration: No Mottled: No Localized Edema: No Pallor: No Rash: No Rubor: No Scarring: Yes Temperature / Pain Moisture Temperature: No Abnormality No Abnormalities Noted: No Tenderness on Palpation: Yes Dry / Scaly: No Maceration: Yes Moist: Yes Wound Preparation Ulcer Cleansing: Other: soap and water, Topical Anesthetic Applied: None Treatment Notes Wound #2 (Right, Medial Malleolus) 1. Cleansed with: Clean wound with Normal Saline Cleanse wound with antibacterial soap and water 2. Anesthetic Topical Lidocaine 4% cream to wound bed prior to debridement 3. Peri-wound Care: Barrier cream Moisturizing lotion 4. Dressing Applied: Hydrafera Blue 5. Secondary Dressing Applied ABD Pad Dry Gauze 7. Secured with Tape 4-Layer Compression System - Right Lower Extremity Notes xtrasorb Electronic Signature(s) Signed: 06/08/2016 4:35:15 PM By: Elpidio EricAfful, Rita BSN, RN Entered By: Elpidio EricAfful, Rita on 06/08/2016 14:51:06

## 2016-06-12 ENCOUNTER — Encounter: Payer: Medicare HMO | Admitting: Internal Medicine

## 2016-06-12 DIAGNOSIS — I87331 Chronic venous hypertension (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: Secondary | ICD-10-CM | POA: Diagnosis not present

## 2016-06-13 NOTE — Progress Notes (Signed)
[name], [name] ([id number]) Visit Report for 06/12/2016 Arrival Information Details [name] RingerBLACKWELL, [name] Date of Service: 06/12/2016 8:00 AM Patient Name: C. Patient Account Number: [id number] Medical Record Treating RN: Clover Mealyfful, RN, BSN, [place] SinkRita [id number] Number: Other Clinician: Date of Birth/Sex: [birth year] [age] y.o. Male) Treating [name] Primary Care Physician: Physician/Extender: G Referring Physician: SELF, REFERRED Weeks in Treatment: 21 Visit Information History Since Last Visit All ordered tests and consults were completed: No Patient Arrived: Ambulatory Added or deleted any medications: No Arrival Time: 08:15 Any new allergies or adverse reactions: No Accompanied By: self Had a fall or experienced change in No Transfer Assistance: None activities of daily living that may affect Patient Identification Verified: Yes risk of falls: Secondary Verification Process Yes Signs or symptoms of abuse/neglect since last No Completed: visito Patient Requires Transmission-Based No Hospitalized since last visit: No Precautions: Has Dressing in Place as Prescribed: Yes Patient Has Alerts: No Pain Present Now: No Electronic Signature(s) Signed: 06/12/2016 5:26:45 PM By: Elpidio EricAfful, Rita BSN, RN Entered By: Elpidio EricAfful, Rita on 06/12/2016 08:15:44 [name], [name] ([id number]) -------------------------------------------------------------------------------- Encounter Discharge Information Details [name] RingerBLACKWELL, [name] Date of Service: 06/12/2016 8:00 AM Patient Name: C. Patient Account Number: [id number] Medical Record Treating RN: Clover Mealyfful, RN, BSN, [name] Number: Other Clinician: Date of Birth/Sex: [birth year] [age] y.o. Male) Treating [name] Primary Care Physician: Physician/Extender: G Referring Physician: SELF, REFERRED Weeks in Treatment: 21 Encounter Discharge Information Items Discharge Pain Level: 0 Discharge Condition: Stable Ambulatory Status:  Ambulatory Discharge Destination: Home Transportation: Private Auto Accompanied By: self Schedule Follow-up Appointment: No Medication Reconciliation completed No and provided to Patient/Care Stellah Donovan: Patient Clinical Summary of Care: Declined Electronic Signature(s) Signed: 06/12/2016 8:52:27 AM By: Gwenlyn PerkingMoore, Shelia Entered By: Gwenlyn PerkingMoore, Shelia on 06/12/2016 08:52:27 [name], [name] ([id number]) -------------------------------------------------------------------------------- Lower Extremity Assessment Details [name] RingerBLACKWELL, [name] Date of Service: 06/12/2016 8:00 AM Patient Name: C. Patient Account Number: [id number] Medical Record Treating RN: Clover Mealyfful, RN, BSN, [place] SinkRita [id number] Number: Other Clinician: Date of Birth/Sex: [birth year] [age] y.o. Male) Treating [name] Primary Care Physician: Physician/Extender: G Referring Physician: SELF, REFERRED Weeks in Treatment: 21 Edema Assessment Assessed: [Left: No] [Right: No] Edema: [Left: N] [Right: o] Calf Left: Right: Point of Measurement: 40 cm From Medial Instep cm 35.4 cm Ankle Left: Right: Point of Measurement: 12 cm From Medial Instep cm 23.4 cm Vascular Assessment Claudication: Claudication Assessment [Right:None] Pulses: Posterior Tibial Dorsalis Pedis Palpable: [Right:Yes] Extremity colors, hair growth, and conditions: Extremity Color: [Right:Mottled] Hair Growth on Extremity: [Right:No] Temperature of Extremity: [Right:Warm] Capillary Refill: [Right:< 3 seconds] Toe Nail Assessment Left: Right: Thick: Yes Discolored: Yes Deformed: No Improper Length and Hygiene: Yes Electronic Signature(s) Signed: 06/12/2016 5:26:45 PM By: Elpidio EricAfful, Rita BSN, RN [name], [name] ([id number]) Entered By: Elpidio EricAfful, Rita on 06/12/2016 08:18:16 [name], [name] ([id number]) -------------------------------------------------------------------------------- Multi Wound Chart Details [name] RingerBLACKWELL, [name] Date of Service: 06/12/2016  8:00 AM Patient Name: C. Patient Account Number: [id number] Medical Record Treating RN: Clover Mealyfful, RN, BSN, [place] SinkRita [id number] Number: Other Clinician: Date of Birth/Sex: [birth year] [age] y.o. Male) Treating [name] Primary Care Physician: Physician/Extender: G Referring Physician: SELF, REFERRED Weeks in Treatment: 21 Vital Signs Height(in): 76 Pulse(bpm): 66 Weight(lbs): 238 Blood Pressure 121/69 (mmHg): Body Mass Index(BMI): 29 Temperature(F): 98.2 Respiratory Rate 18 (breaths/min): Photos: [1:No Photos] [2:No Photos] [N/A:N/A] Wound Location: [1:Right Malleolus - Lateral] [2:Right Malleolus - Medial] [N/A:N/A] Wounding Event: [1:Gradually Appeared] [2:Gradually Appeared] [N/A:N/A] Primary Etiology: [1:Venous Leg Ulcer] [2:Venous Leg Ulcer] [N/A:N/A] Comorbid History: [1:Cataracts, Chronic Obstructive Pulmonary Disease (COPD), Osteoarthritis] [2:Cataracts, Chronic Obstructive Pulmonary Disease (COPD),  Osteoarthritis] [N/A:N/A] Date Acquired: [1:08/11/2015] [2:01/30/2016] [N/A:N/A] Weeks of Treatment: [1:21] [2:19] [N/A:N/A] Wound Status: [1:Open] [2:Open] [N/A:N/A] Clustered Wound: [1:No] [2:Yes] [N/A:N/A] Measurements L x W x D 8.5x3.5x0.2 [2:4.8x3x0.2] [N/A:N/A] (cm) Area (cm) : [1:23.366] [2:11.31] [N/A:N/A] Volume (cm) : [1:4.673] [2:2.262] [N/A:N/A] % Reduction in Area: [1:-35.20%] [2:2.00%] [N/A:N/A] % Reduction in Volume: 9.90% [2:-95.80%] [N/A:N/A] Classification: [1:Full Thickness Without Exposed Support Structures] [2:Full Thickness Without Exposed Support Structures] [N/A:N/A] Exudate Amount: [1:Large] [2:Large] [N/A:N/A] Exudate Type: [1:Serosanguineous] [2:Serosanguineous] [N/A:N/A] Exudate Color: [1:red, brown] [2:red, brown] [N/A:N/A] Foul Odor After [1:Yes] [2:No] [N/A:N/A] Cleansing: Odor Anticipated Due to No [2:N/A] [N/A:N/A] Product UseKENTAVIUS, DETTORE ([id number]) Wound Margin: Thickened Distinct, outline attached N/A Granulation Amount:  Medium (34-66%) Medium (34-66%) N/A Granulation Quality: Pink Pink N/A Necrotic Amount: Medium (34-66%) Medium (34-66%) N/A Exposed Structures: Fascia: No Fascia: No N/A Fat: No Fat: No Tendon: No Tendon: No Muscle: No Muscle: No Joint: No Joint: No Bone: No Bone: No Limited to Skin Limited to Skin Breakdown Breakdown Epithelialization: Small (1-33%) Medium (34-66%) N/A Periwound Skin Texture: Scarring: Yes Scarring: Yes N/A Edema: No Edema: No Excoriation: No Excoriation: No Induration: No Induration: No Callus: No Callus: No Crepitus: No Crepitus: No Fluctuance: No Fluctuance: No Friable: No Friable: No Rash: No Rash: No Periwound Skin Maceration: Yes Maceration: Yes N/A Moisture: Moist: Yes Moist: Yes Dry/Scaly: No Dry/Scaly: No Periwound Skin Color: Atrophie Blanche: No Atrophie Blanche: No N/A Cyanosis: No Cyanosis: No Ecchymosis: No Ecchymosis: No Erythema: No Erythema: No Hemosiderin Staining: No Hemosiderin Staining: No Mottled: No Mottled: No Pallor: No Pallor: No Rubor: No Rubor: No Temperature: No Abnormality No Abnormality N/A Tenderness on No Yes N/A Palpation: Wound Preparation: Ulcer Cleansing: Other: Ulcer Cleansing: Other: N/A soap and water soap and water Topical Anesthetic Topical Anesthetic Applied: Other: lidocaine Applied: Other: lidocaine 4% 4% Treatment Notes Electronic Signature(s) Signed: 06/12/2016 5:26:45 PM By: [name] BSN, RN Entered By: [name] on 06/12/2016 08:33:17 [name], [name] ([id number]) [name], [name] ([id number]) -------------------------------------------------------------------------------- Multi-Disciplinary Care Plan Details [name] Date of Service: 06/12/2016 8:00 AM Patient Name: C. Patient Account Number: [id number] Medical Record Treating RN: [name] RN, BSN, [place] Sink [id number] Number: Other Clinician: Date of Birth/Sex: [birth year] (70 y.o. Male) Treating [name],  [name] Primary Care Physician: Physician/Extender: G Referring Physician: SELF, REFERRED Weeks in Treatment: 21 Active Inactive Venous Leg Ulcer Nursing Diagnoses: Knowledge deficit related to disease process and management Goals: Patient will maintain optimal edema control Date Initiated: 04/03/2016 Goal Status: Active Interventions: Compression as ordered Treatment Activities: Therapeutic compression applied : 04/03/2016 Notes: Wound/Skin Impairment Nursing Diagnoses: Impaired tissue integrity Goals: Ulcer/skin breakdown will heal within 14 weeks Date Initiated: 01/17/2016 Goal Status: Active Interventions: Assess ulceration(s) every visit Notes: Electronic Signature(s) Signed: 06/12/2016 5:26:45 PM By: [name] BSN, RN [name] Stack ([id number]) Entered By: [name] on 06/12/2016 08:33:07 [name] Stack ([id number]) -------------------------------------------------------------------------------- Pain Assessment Details [name] Date of Service: 06/12/2016 8:00 AM Patient Name: C. Patient Account Number: [id number] Medical Record Treating RN: [name] RN, BSN, [place] Sink [id number] Number: Other Clinician: Date of Birth/Sex: 1946-01-05 (70 y.o. Male) Treating [name] Primary Care Physician: Physician/Extender: G Referring Physician: SELF, REFERRED Weeks in Treatment: 21 Active Problems Location of Pain Severity and Description of Pain Patient Has Paino No Site Locations With Dressing Change: No Pain Management and Medication Current Pain Management: Electronic Signature(s) Signed: 06/12/2016 5:26:45 PM By: [name] BSN, RN Entered By: [name] on 06/12/2016 08:16:29 [name] Stack ([id number]) -------------------------------------------------------------------------------- Patient/Caregiver Education Details [name] Date  of Service: 06/12/2016 8:00 AM Patient Name: C. Patient Account Number: [id number] Medical  Record Treating RN: [name] RN, BSN, [place] Sink [id number] Number: Other Clinician: Date of Birth/Gender: Jan 12, 1946 (70 y.o. Male) Treating [name] Primary Care Physician: Physician/Extender: G Referring Physician: SELF, REFERRED Weeks in Treatment: 21 Education Assessment Education Provided To: Patient Education Topics Provided Basic Hygiene: Methods: Explain/Verbal Responses: State content correctly Wound/Skin Impairment: Methods: Explain/Verbal Responses: State content correctly Electronic Signature(s) Signed: 06/12/2016 5:26:45 PM By: [name] BSN, RN Entered By: [name] on 06/12/2016 08:37:18 [name] Stack ([id number]) -------------------------------------------------------------------------------- Wound Assessment Details [name] Date of Service: 06/12/2016 8:00 AM Patient Name: C. Patient Account Number: [id number] Medical Record Treating RN: [name], RN, BSN, [place] Sink [id number] Number: Other Clinician: Date of Birth/Sex: 04/14/1946 (70 y.o. Male) Treating [name] Primary Care Physician: Physician/Extender: G Referring Physician: SELF, REFERRED Weeks in Treatment: 21 Wound Status Wound Number: 1 Primary Venous Leg Ulcer Etiology: Wound Location: Right Malleolus - Lateral Wound Open Wounding Event: Gradually Appeared Status: Date Acquired: 08/11/2015 Comorbid Cataracts, Chronic Obstructive Weeks Of Treatment: 21 History: Pulmonary Disease (COPD), Clustered Wound: No Osteoarthritis Photos Photo Uploaded By: [name] on 06/12/2016 17:14:31 Wound Measurements Length: (cm) 8.5 Width: (cm) 3.5 Depth: (cm) 0.2 Area: (cm) 23.366 Volume: (cm) 4.673 % Reduction in Area: -35.2% % Reduction in Volume: 9.9% Epithelialization: Small (1-33%) Tunneling: No Undermining: No Wound Description Full Thickness Without Exposed Classification: Support Structures Wound Margin: Thickened Exudate Large Amount: Exudate Type:  Serosanguineous Exudate Color: red, brown Foul Odor After Cleansing: Yes Due to Product Use: No Wound Bed Granulation Amount: Medium (34-66%) Exposed Structure Granulation Quality: Pink Fascia Exposed: No [name], [name] ([id number]) Necrotic Amount: Medium (34-66%) Fat Layer Exposed: No Necrotic Quality: Adherent Slough Tendon Exposed: No Muscle Exposed: No Joint Exposed: No Bone Exposed: No Limited to Skin Breakdown Periwound Skin Texture Texture Color No Abnormalities Noted: No No Abnormalities Noted: No Callus: No Atrophie Blanche: No Crepitus: No Cyanosis: No Excoriation: No Ecchymosis: No Fluctuance: No Erythema: No Friable: No Hemosiderin Staining: No Induration: No Mottled: No Localized Edema: No Pallor: No Rash: No Rubor: No Scarring: Yes Temperature / Pain Moisture Temperature: No Abnormality No Abnormalities Noted: No Dry / Scaly: No Maceration: Yes Moist: Yes Wound Preparation Ulcer Cleansing: Other: soap and water, Topical Anesthetic Applied: Other: lidocaine 4%, Treatment Notes Wound #1 (Right, Lateral Malleolus) 1. Cleansed with: Clean wound with Normal Saline Cleanse wound with antibacterial soap and water 2. Anesthetic Topical Lidocaine 4% cream to wound bed prior to debridement 3. Peri-wound Care: Barrier cream Moisturizing lotion 4. Dressing Applied: Iodoflex 5. Secondary Dressing Applied ABD Pad Dry Gauze 7. Secured with Tape [name], [name] ([id number]) 4-Layer Compression System - Right Lower Extremity Notes xtrasorb Electronic Signature(s) Signed: 06/12/2016 5:26:45 PM By: [name] BSN, RN Entered By: [name] on 06/12/2016 08:29:13 [name] Stack ([id number]) -------------------------------------------------------------------------------- Wound Assessment Details [name] Date of Service: 06/12/2016 8:00 AM Patient Name: C. Patient Account Number: [id number] Medical Record Treating RN:  [name] RN, BSN, [place] Sink [id number] Number: Other Clinician: Date of Birth/Sex: 28-Jul-1946 (70 y.o. Male) Treating [name] Primary Care Physician: Physician/Extender: G Referring Physician: SELF, REFERRED Weeks in Treatment: 21 Wound Status Wound Number: 2 Primary Venous Leg Ulcer Etiology: Wound Location: Right Malleolus - Medial Wound Open Wounding Event: Gradually Appeared Status: Date Acquired: 01/30/2016 Comorbid Cataracts, Chronic Obstructive Weeks Of Treatment: 19 History: Pulmonary Disease (COPD), Clustered Wound: Yes Osteoarthritis Photos Photo Uploaded By: [name] on 06/12/2016 17:14:31 Wound Measurements Length: (cm) 4.8 Width: (  cm) 3 Depth: (cm) 0.2 Area: (cm) 11.31 Volume: (cm) 2.262 % Reduction in Area: 2% % Reduction in Volume: -95.8% Epithelialization: Medium (34-66%) Tunneling: No Undermining: No Wound Description Full Thickness Without Exposed Classification: Support Structures Wound Margin: Distinct, outline attached Exudate Large Amount: Exudate Type: Serosanguineous Exudate Color: red, brown Foul Odor After Cleansing: No Wound Bed Granulation Amount: Medium (34-66%) Exposed Structure Granulation Quality: Pink Fascia Exposed: No [name], [name] Necrotic Amount: Medium (34-66%) Fat Layer Exposed: No Necrotic Quality: Adherent Slough Tendon Exposed: No Muscle Exposed: No Joint Exposed: No Bone Exposed: No Limited to Skin Breakdown Periwound Skin Texture Texture Color No Abnormalities Noted: No No Abnormalities Noted: No Callus: No Atrophie Blanche: No Crepitus: No Cyanosis: No Excoriation: No Ecchymosis: No Fluctuance: No Erythema: No Friable: No Hemosiderin Staining: No Induration: No Mottled: No Localized Edema: No Pallor: No Rash: No Rubor: No Scarring: Yes Temperature / Pain Moisture Temperature: No Abnormality No Abnormalities Noted: No Tenderness on Palpation: Yes Dry / Scaly:  No Maceration: Yes Moist: Yes Wound Preparation Ulcer Cleansing: Other: soap and water, Topical Anesthetic Applied: Other: lidocaine 4%, Treatment Notes Wound #2 (Right, Medial Malleolus) 1. Cleansed with: Clean wound with Normal Saline Cleanse wound with antibacterial soap and water 2. Anesthetic Topical Lidocaine 4% cream to wound bed prior to debridement 3. Peri-wound Care: Barrier cream Moisturizing lotion 4. Dressing Applied: Iodoflex 5. Secondary Dressing Applied ABD Pad Dry Gauze 7. Secured with Tape [name], [name] ([id number]) 4-Layer Compression System - Right Lower Extremity Notes xtrasorb Electronic Signature(s) Signed: 06/12/2016 5:26:45 PM By: [name] BSN, RN Entered By: [name] on 06/12/2016 08:32:51 [name] Stack ([id number]) -------------------------------------------------------------------------------- Vitals Details [name] Date of Service: 06/12/2016 8:00 AM Patient Name: C. Patient Account Number: [id number] Medical Record Treating RN: [name], RN, BSN, [place] Sink [id number] Number: Other Clinician: Date of Birth/Sex: 02/23/1946 (70 y.o. Male) Treating [name] Primary Care Physician: Physician/Extender: G Referring Physician: SELF, REFERRED Weeks in Treatment: 21 Vital Signs Time Taken: 08:16 Temperature (F): 98.2 Height (in): 76 Pulse (bpm): 66 Weight (lbs): 238 Respiratory Rate (breaths/min): 18 Body Mass Index (BMI): 29 Blood Pressure (mmHg): 121/69 Reference Range: 80 - 120 mg / dl Electronic Signature(s) Signed: 06/12/2016 5:26:45 PM By: [name] BSN, RN Entered By: [name] on 06/12/2016 08:17:15

## 2016-06-13 NOTE — Progress Notes (Signed)
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 Mottled, Pallor, Rubor, Erythema. Periwound temperature was noted as No Abnormality. The periwound has tenderness on palpation. 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 There was a Skin/Subcutaneous Tissue Debridement ([id number]) debridement with total area of 29.75 sq cm performed by Rosalio Macadamia, [name] ([id number]) Venetia Night, MD. with the following instrument(s): Curette to remove Non-Viable tissue/material including Fibrin/Slough and Subcutaneous after achieving pain control using Lidocaine 4% Topical Solution. A time out was conducted at 08:30, prior to the start of the procedure. A Minimum amount of  bleeding was controlled with Pressure. The procedure was tolerated well with a pain level of 0 throughout and a pain level of 0 following the procedure. Post Debridement Measurements: 8.5cm length x 3.5cm width x 0.2cm depth; 4.673cm^3 volume. Character of Wound/Ulcer Post Debridement requires further debridement. Severity of Tissue Post Debridement is: Fat layer exposed. Post procedure Diagnosis Wound #1: Same as Pre-Procedure Wound #2 Wound #2 is a Venous Leg Ulcer located on the Right,Medial Malleolus . There was a Skin/Subcutaneous Tissue Debridement ([id number]) debridement with total area of 14.4 sq cm performed by [name], MD. with the following instrument(s): Curette to remove Non-Viable tissue/material including Fibrin/Slough and Subcutaneous after achieving pain control using Lidocaine 4% Topical Solution. A time out was conducted at 08:30, prior to the start of the procedure. A Minimum amount of bleeding was controlled with Pressure. The procedure was tolerated well with a pain level of 0 throughout and a pain level of 0 following the procedure. Post Debridement Measurements: 4.8cm length x 3cm width x 0.2cm depth; 2.262cm^3 volume. Character of Wound/Ulcer Post Debridement requires further debridement. Severity of Tissue Post Debridement is: Fat layer exposed. Post procedure Diagnosis Wound #2: Same as Pre-Procedure Plan Wound Cleansing: Wound #1 Right,Lateral Malleolus: Cleanse wound with mild soap and water May shower with protection. No tub bath. Wound #2 Right,Medial Malleolus: Cleanse wound with mild soap and water May shower with protection. No tub bath. Anesthetic: Wound #1 Right,Lateral Malleolus: Topical Lidocaine 4% cream applied to wound bed prior to debridement Wound #2 Right,Medial Malleolus: Topical Lidocaine 4% cream applied to wound bed prior to debridement Skin Barriers/Peri-Wound Care: Wound #1 Right,Lateral Malleolus: Barrier cream - zinc  oxide Moisturizing lotion Wound #2 Right,Medial Malleolus: [name], [name] ([id number]) Barrier cream - zinc oxide Moisturizing lotion Primary Wound Dressing: Wound #1 Right,Lateral Malleolus: Iodoflex Wound #2 Right,Medial Malleolus: Iodoflex Secondary Dressing: Wound #1 Right,Lateral Malleolus: ABD pad Dry Gauze XtraSorb - large pad Wound #2 Right,Medial Malleolus: ABD pad Dry Gauze XtraSorb - large pad Dressing Change Frequency: Wound #1 Right,Lateral Malleolus: Other: - Nurse Visit Friday Follow-up Appointments: Wound #1 Right,Lateral Malleolus: Return Appointment in 1 week. Nurse Visit as needed - Friday Wound #2 Right,Medial Malleolus: Return Appointment in 1 week. Nurse Visit as needed - Friday Edema Control: Wound #1 Right,Lateral Malleolus: 4-Layer Compression System - Right Lower Extremity - UNNA TO ANCHOR Wound #2 Right,Medial Malleolus: 4-Layer Compression  System - Right Lower Extremity - UNNA TO ANCHOR Additional Orders / Instructions: Wound #1 Right,Lateral Malleolus: Increase protein intake. OK to return to work with the following restrictions: Activity as tolerated Wound #2 Right,Medial Malleolus: Increase protein intake. OK to return to work with the following restrictions: Activity as tolerated #1 I'm changing him back to Iodoflex ABD under a Profore. #2 consider reducing him to a Profore light [name], [name] ([id number]) Electronic Signature(s) Signed: 06/13/2016 8:01:59 AM By: [name] MD Entered By: [name] on 06/12/2016 12:28:32 [name] ([id number]) -------------------------------------------------------------------------------- SuperBill Details [name] Date of Service: 06/12/2016 Patient Name: C. Patient Account Number: [id number] Medical Record Treating RN: [name] RN, BSN, [place] [id number] Number: Other Clinician: Date of Birth/Sex: 1946-04-16 (70 y.o. 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, initial T85.613A encounter Facility Procedures CPT4 Code Description: 08657846 11042 - DEB SUBQ TISSUE 20 SQ CM/< ICD-10 Description Diagnosis L97.313 Non-pressure chronic ulcer of right ankle with necros Modifier: is of muscle Quantity: 1 CPT4 Code Description: [id number] 11045 - DEB SUBQ TISS EA ADDL 20CM ICD-10 Description Diagnosis L97.313 Non-pressure chronic ulcer of right ankle with necros Modifier: is of muscle Quantity: 2 Physician Procedures CPT4 Code Description: 1324401 11042 - WC PHYS SUBQ TISS 20 SQ CM ICD-10 Description Diagnosis L97.313 Non-pressure chronic ulcer of right ankle with necros Modifier: is of muscle Quantity: 1 CPT4 Code Description: 0272536 11045 - WC PHYS SUBQ TISS EA ADDL 20 CM ICD-10 Description Diagnosis L97.313 Non-pressure chronic ulcer of right ankle with necros [name], [name] ([id number]) Modifier: is of muscle Quantity: 2 Electronic Signature(s) Signed: 06/13/2016 8:01:59 AM By: [name] MD Entered By: [name] on 06/12/2016 12:28:59

## 2016-06-15 DIAGNOSIS — I87331 Chronic venous hypertension (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: Secondary | ICD-10-CM | POA: Diagnosis not present

## 2016-06-16 NOTE — Progress Notes (Signed)
[name], Jahmire C. ([id number]) Visit Report for 06/15/2016 Physician Orders Details Patient Name: [name], Homero C. Date of Service: 06/15/2016 9:15 AM Medical Record Number: [id number] Patient Account Number: [id number] Date of Birth/Sex: 11/05/1945 [age] y.o. Male) Treating RN: Huel CoventryWoody, Kim Primary Care Physician: Other Clinician: Referring Physician: SELF, REFERRED Treating Physician/Extender: Rudene ReBritto, Saralee Bolick Weeks in Treatment: 21 Verbal / Phone Orders: Yes Clinician: Huel CoventryWoody, Kim Read Back and Verified: Yes Diagnosis Coding Wound Cleansing Wound #1 Right,Lateral Malleolus o Cleanse wound with mild soap and water o May shower with protection. o No tub bath. Wound #2 Right,Medial Malleolus o Cleanse wound with mild soap and water o May shower with protection. o No tub bath. Skin Barriers/Peri-Wound Care Wound #1 Right,Lateral Malleolus o Barrier cream - zinc oxide o Moisturizing lotion Wound #2 Right,Medial Malleolus o Barrier cream - zinc oxide o Moisturizing lotion Primary Wound Dressing Wound #1 Right,Lateral Malleolus o Hydrafera Blue Wound #2 Right,Medial Malleolus o Hydrafera Blue Secondary Dressing Wound #1 Right,Lateral Malleolus o ABD pad o Dry Gauze Wound #2 Right,Medial Malleolus Michels, Delsin C. ([id number]) o ABD pad o Dry Gauze Follow-up Appointments Wound #1 Right,Lateral Malleolus o Return Appointment in 1 week. Edema Control Wound #1 Right,Lateral Malleolus o 4-Layer Compression System - Right Lower Extremity - UNNA TO ANCHOR Wound #2 Right,Medial Malleolus o 4-Layer Compression System - Right Lower Extremity - UNNA TO ANCHOR Additional Orders / Instructions Wound #1 Right,Lateral Malleolus o Increase protein intake. o OK to return to work with the following restrictions: o Activity as tolerated Wound #2 Right,Medial Malleolus o Increase protein intake. o OK to return to work with the  following restrictions: o Activity as tolerated Psychologist, prison and probation serviceslectronic Signature(s) Signed: 06/15/2016 3:22:56 PM By: Elliot GurneyWoody, RN, BSN, Kim RN, BSN Signed: 06/15/2016 4:40:13 PM By: Evlyn KannerBritto, Nery Frappier MD, FACS Entered By: Elliot GurneyWoody, RN, BSN, Kim on 06/15/2016 09:57:57 [name], Tonya C. ([id number]) -------------------------------------------------------------------------------- SuperBill Details Patient Name: [name], [place] C. Date of Service: 06/15/2016 Medical Record Number: [id number] Patient Account Number: [id number] Date of Birth/Sex: 11/05/1945 [age] y.o. 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

## 2016-06-16 NOTE — Progress Notes (Signed)
KAUAN, KLOOSTERMAN ([id number]) Visit Report for 06/15/2016 Arrival Information Details Patient Name: [name], [name]. Date of Service: 06/15/2016 9:15 AM Medical Record Number: [id number] Patient Account Number: [id number] Date of Birth/Sex: 1946/07/24 (70 y.o. Male) Treating RN: Huel Coventry Primary Care Physician: Other Clinician: Referring Physician: SELF, REFERRED Treating Physician/Extender: Rudene Re in Treatment: 21 Visit Information History Since Last Visit Added or deleted any medications: No Patient Arrived: Ambulatory Any new allergies or adverse reactions: No Arrival Time: 09:32 Had a fall or experienced change in No Accompanied By: self activities of daily living that may affect Transfer Assistance: None risk of falls: Patient Identification Verified: Yes Signs or symptoms of abuse/neglect since last No Secondary Verification Process Yes visito Completed: Hospitalized since last visit: No Patient Requires Transmission-Based No Has Dressing in Place as Prescribed: Yes Precautions: Has Compression in Place as Prescribed: Yes Patient Has Alerts: No Pain Present Now: No Electronic Signature(s) Signed: 06/15/2016 3:22:56 PM By: Elliot Gurney, RN, BSN, Kim RN, BSN Entered By: Elliot Gurney, RN, BSN, Kim on 06/15/2016 09:32:58 [name] ([id number]) -------------------------------------------------------------------------------- Encounter Discharge Information Details Patient Name: [name] Date of Service: 06/15/2016 9:15 AM Medical Record Number: [id number] Patient Account Number: [id number] Date of Birth/Sex: [birth year] (70 y.o. Male) Treating RN: Huel Coventry Primary Care Physician: Other Clinician: Referring Physician: SELF, REFERRED Treating Physician/Extender: Rudene Re in Treatment: 21 Encounter Discharge Information Items Discharge Pain Level: 0 Discharge Condition: Stable Ambulatory Status: Ambulatory Discharge  Destination: Home Private Transportation: Auto Accompanied By: self Schedule Follow-up Appointment: Yes Medication Reconciliation completed and Yes provided to Patient/Care Nyron Mozer: Clinical Summary of Care: Electronic Signature(s) Signed: 06/15/2016 3:22:56 PM By: Elliot Gurney, RN, BSN, Kim RN, BSN Entered By: Elliot Gurney, RN, BSN, Kim on 06/15/2016 09:58:49 [name] ([id number]) -------------------------------------------------------------------------------- Patient/Caregiver Education Details Patient Name: [name] Date of Service: 06/15/2016 9:15 AM Medical Record Number: [id number] Patient Account Number: [id number] Date of Birth/Gender: [birth year] (70 y.o. Male) Treating RN: Huel Coventry Primary Care Physician: Other Clinician: Referring Physician: SELF, REFERRED Treating Physician/Extender: Rudene Re in Treatment: 21 Education Assessment Education Provided To: Patient Education Topics Provided Tissue Oxygenation: Venous: Handouts: Controlling Swelling with Multilayered Compression Wraps, Other: do not get wrap wet Methods: Demonstration, Explain/Verbal Responses: State content correctly Electronic Signature(s) Signed: 06/15/2016 3:22:56 PM By: Elliot Gurney, RN, BSN, Kim RN, BSN Entered By: Elliot Gurney, RN, BSN, Kim on 06/15/2016 09:58:34 [name] ([id number]) -------------------------------------------------------------------------------- Wound Assessment Details Patient Name: [name] Date of Service: 06/15/2016 9:15 AM Medical Record Number: [id number] Patient Account Number: [id number] Date of Birth/Sex: 03-27-1946 (70 y.o. Male) Treating RN: Huel Coventry Primary Care Physician: Other Clinician: Referring Physician: SELF, REFERRED Treating Physician/Extender: Rudene Re in Treatment: 21 Wound Status Wound Number: 1 Primary Venous Leg Ulcer Etiology: Wound Location: Right Malleolus - Lateral Wound Open Wounding Event:  Gradually Appeared Status: Date Acquired: 08/11/2015 Comorbid Cataracts, Chronic Obstructive Weeks Of Treatment: 21 History: Pulmonary Disease (COPD), Clustered Wound: No Osteoarthritis Photos Wound Measurements Length: (cm) 7 Width: (cm) 4 Depth: (cm) 0.2 Area: (cm) 21.991 Volume: (cm) 4.398 % Reduction in Area: -27.3% % Reduction in Volume: 15.2% Epithelialization: Small (1-33%) Wound Description Full Thickness Without Exposed Classification: Support Structures Wound Margin: Thickened Exudate Large Amount: Exudate Type: Serosanguineous Exudate Color: red, brown Foul Odor After Cleansing: Yes Due to Product Use: No Wound Bed Granulation Amount: Medium (34-66%) Exposed Structure Granulation Quality: Pink Fascia Exposed: No Necrotic Amount: Medium (34-66%) Fat Layer Exposed: No [name], [name] ([id number]) Necrotic Quality: Adherent Slough Tendon Exposed:  No Muscle Exposed: No Joint Exposed: No Bone Exposed: No Limited to Skin Breakdown Periwound Skin Texture Texture Color No Abnormalities Noted: No No Abnormalities Noted: No Callus: No Atrophie Blanche: No Crepitus: No Cyanosis: No Excoriation: No Ecchymosis: No Fluctuance: No Erythema: No Friable: No Hemosiderin Staining: No Induration: No Mottled: No Localized Edema: No Pallor: No Rash: No Rubor: No Scarring: Yes Temperature / Pain Moisture Temperature: No Abnormality No Abnormalities Noted: No Dry / Scaly: No Maceration: Yes Moist: Yes Wound Preparation Ulcer Cleansing: Other: soap and water, Topical Anesthetic Applied: None Treatment Notes Wound #1 (Right, Lateral Malleolus) 1. Cleansed with: Clean wound with Normal Saline 4. Dressing Applied: Hydrafera Blue 5. Secondary Dressing Applied ABD Pad 7. Secured with 4-Layer Compression System - Right Lower Extremity Electronic Signature(s) Signed: 06/15/2016 3:22:56 PM By: Elliot GurneyWoody, RN, BSN, Kim RN, BSN Entered By: Elliot GurneyWoody, RN,  BSN, Kim on 06/15/2016 09:42:39 [name], [name] ([id number]) -------------------------------------------------------------------------------- Wound Assessment Details Patient Name: [name], [name] Date of Service: 06/15/2016 9:15 AM Medical Record Number: [id number] Patient Account Number: [id number] Date of Birth/Sex: 1946/02/02 [age] y.o. Male) Treating RN: Huel CoventryWoody, Kim Primary Care Physician: Other Clinician: Referring Physician: SELF, REFERRED Treating Physician/Extender: Rudene ReBritto, Errol Weeks in Treatment: 21 Wound Status Wound Number: 2 Primary Venous Leg Ulcer Etiology: Wound Location: Right Malleolus - Medial Wound Open Wounding Event: Gradually Appeared Status: Date Acquired: 01/30/2016 Comorbid Cataracts, Chronic Obstructive Weeks Of Treatment: 19 History: Pulmonary Disease (COPD), Clustered Wound: Yes Osteoarthritis Photos Wound Measurements Length: (cm) 5 Width: (cm) 5 Depth: (cm) 0.2 Area: (cm) 19.635 Volume: (cm) 3.927 % Reduction in Area: -70.1% % Reduction in Volume: -240% Epithelialization: Medium (34-66%) Wound Description Full Thickness Without Exposed Classification: Support Structures Wound Margin: Distinct, outline attached Exudate Large Amount: Exudate Type: Serosanguineous Exudate Color: red, brown Foul Odor After Cleansing: No Wound Bed Granulation Amount: Medium (34-66%) Exposed Structure Granulation Quality: Pink Fascia Exposed: No Necrotic Amount: Medium (34-66%) Fat Layer Exposed: No [name], [name] ([id number]) Necrotic Quality: Adherent Slough Tendon Exposed: No Muscle Exposed: No Joint Exposed: No Bone Exposed: No Limited to Skin Breakdown Periwound Skin Texture Texture Color No Abnormalities Noted: No No Abnormalities Noted: No Callus: No Atrophie Blanche: No Crepitus: No Cyanosis: No Excoriation: No Ecchymosis: No Fluctuance: No Erythema: No Friable: No Hemosiderin Staining: No Induration: No Mottled:  No Localized Edema: No Pallor: No Rash: No Rubor: No Scarring: Yes Temperature / Pain Moisture Temperature: No Abnormality No Abnormalities Noted: No Tenderness on Palpation: Yes Dry / Scaly: No Maceration: Yes Moist: Yes Wound Preparation Ulcer Cleansing: Other: soap and water, Treatment Notes Wound #2 (Right, Medial Malleolus) 1. Cleansed with: Clean wound with Normal Saline 4. Dressing Applied: Hydrafera Blue 5. Secondary Dressing Applied ABD Pad 7. Secured with 4-Layer Compression System - Right Lower Extremity Electronic Signature(s) Signed: 06/15/2016 3:22:56 PM By: Elliot GurneyWoody, RN, BSN, Kim RN, BSN Entered By: Elliot GurneyWoody, RN, BSN, Kim on 06/15/2016 09:43:19

## 2016-06-19 ENCOUNTER — Encounter: Payer: Medicare HMO | Admitting: Internal Medicine

## 2016-06-19 DIAGNOSIS — I87331 Chronic venous hypertension (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: Secondary | ICD-10-CM | POA: Diagnosis not present

## 2016-06-21 NOTE — Progress Notes (Signed)
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 Excoriation, Fluctuance, Friable, Induration, Localized Edema, Rash, Dry/Scaly, Atrophie Blanche, Cyanosis, Ecchymosis, Hemosiderin Staining, Mottled, Pallor, Rubor, Erythema. Periwound temperature was noted as No Abnormality. The periwound has tenderness on palpation. 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 Procedures Wound #1 Wound #1 is a Venous Leg Ulcer located on the Right,Lateral Malleolus . There was a Skin/Subcutaneous Tissue Debridement ([id number]) debridement with total area of 45 sq cm performed by [name], MD. with the following instrument(s): Curette to remove Viable and Non-Viable tissue/material including  Exudate, Fibrin/Slough, and Subcutaneous after achieving pain control using Lidocaine 4% Topical Solution. A time out was conducted at 08:30, prior to the start of the procedure. A Minimum amount of bleeding was controlled with Pressure. The procedure was tolerated well with a pain level of 0 throughout and a pain level of 0 following the procedure. Post Debridement Measurements: 7.5cm length x 6cm width x 0.2cm depth; 7.069cm^3 volume. Character of Wound/Ulcer Post Debridement requires further debridement. Severity of Tissue Post Debridement is: Fat layer exposed. Post procedure Diagnosis Wound #1: Same as Pre-Procedure Wound #2 Wound #2 is a Venous Leg Ulcer located on the Right,Medial Malleolus . There was a Skin/Subcutaneous Tissue Debridement ([id number]) debridement with total area of 18.5 sq cm performed by [name], MD. with the following instrument(s): Curette to remove Viable and Non-Viable tissue/material including Exudate, Fibrin/Slough, and Subcutaneous after achieving pain control using Lidocaine 4% Topical Solution. A time out was conducted at 08:30, prior to the start of the procedure. A Minimum amount of bleeding was controlled with Pressure. The procedure was tolerated well with a pain level of 0 throughout and a pain level of 0 following the procedure. Post Debridement Measurements: 5cm length x 3.7cm width x 0.2cm depth; 2.906cm^3 volume. Character of Wound/Ulcer Post Debridement requires further debridement. Severity of Tissue Post Debridement is: Fat layer exposed. Post procedure Diagnosis Wound #2: Same as Pre-Procedure Plan Wound Cleansing: Wound #1 Right,Lateral Malleolus: Cleanse wound with mild soap and water May shower with protection. No tub bath. Wound #2 Right,Medial Malleolus: Cleanse wound with mild soap and water May shower with protection. No tub bath. Skin Barriers/Peri-Wound Care: Wound #1 Right,Lateral Malleolus: YING, ROCKS  ([id number]) Barrier cream - zinc oxide Moisturizing lotion Wound #2 Right,Medial Malleolus: Barrier cream - zinc oxide Moisturizing lotion Primary Wound Dressing: Wound #1 Right,Lateral Malleolus: Santyl Ointment Wound #2 Right,Medial Malleolus: Santyl Ointment Secondary Dressing: Wound #1 Right,Lateral Malleolus: ABD pad Dry Gauze Drawtex Wound #2 Right,Medial Malleolus: ABD pad Dry Gauze Drawtex Follow-up Appointments: Wound #1 Right,Lateral Malleolus: Return Appointment in 1 week. Edema Control: Wound #1 Right,Lateral Malleolus: 4-Layer Compression System - Right Lower Extremity - UNNA TO ANCHOR Wound #2 Right,Medial Malleolus: 4-Layer Compression System - Right Lower Extremity - UNNA TO ANCHOR Additional Orders / Instructions: Wound #1 Right,Lateral Malleolus: Increase protein intake. OK to return to work with the following restrictions: Activity as tolerated Wound #2 Right,Medial Malleolus: Increase protein intake. OK to return to work with the following restrictions:  Activity as tolerated #1 clearly he cannot tolerate Iodoflex because of pain. [facility]. The options to adequately debridement this area are limited Electronic Signature(s) Signed: 06/19/2016 5:12:34 PM By: Baltazar Najjarobson, Larnce Schnackenberg MD Entered By: Baltazar Najjarobson, Anson Peddie on 06/19/2016 09:06:27 [name] StackBLACKWELL, [name] ([id number]) [name] StackBLACKWELL, [name] ([id number]) -------------------------------------------------------------------------------- SuperBill Details [name] RingerBLACKWELL, [name] Date of Service: 06/19/2016 Patient Name: C. Patient Account Number: [id number] Medical Record Treating RN: Phillis Haggisinkerton, [name] [id number] Number: Other Clinician: Date of Birth/Sex: May 22, 1946 [age] y.o. 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, initial T85.613A encounter Facility Procedures CPT4 Code Description: [id number] 11042 - DEB SUBQ TISSUE 20 SQ CM/< ICD-10 Description Diagnosis L97.313 Non-pressure chronic ulcer of right ankle with necros Modifier: is of muscle Quantity: 1 CPT4 Code Description: [id number] 11045 - DEB SUBQ TISS EA ADDL 20CM ICD-10 Description Diagnosis L97.313 Non-pressure chronic ulcer of right ankle with necros Modifier: is of muscle Quantity: 2 Physician Procedures CPT4 Code Description: [id number] 11042 - WC PHYS SUBQ TISS 20 SQ CM ICD-10 Description Diagnosis L97.313 Non-pressure chronic ulcer of right ankle with necros Modifier: is of muscle Quantity: 1 CPT4 Code Description: [id number] 11045 - WC PHYS SUBQ TISS EA ADDL 20 CM ICD-10 Description Diagnosis L97.313 Non-pressure chronic ulcer of right ankle with necros [name] StackBLACKWELL, [name] ([id number]) Modifier: is of muscle Quantity: 2 Electronic Signature(s) Signed: 06/19/2016 5:12:34 PM By: Baltazar Najjarobson, Aiva Miskell MD Entered By: Baltazar Najjarobson, [name] Goldberg on 06/19/2016 09:07:06

## 2016-06-21 NOTE — Progress Notes (Signed)
[name], [name] ([id number]) Visit Report for 06/19/2016 Arrival Information Details BURTON, GAHAN Date of Service: 06/19/2016 8:00 AM Patient Name: C. Patient Account Number: [id number] Medical Record Treating RN: [name] [id number] Number: Other Clinician: Date of Birth/Sex: 1945-12-14 (70 y.o. Male) Treating [name] Primary Care Physician: Physician/Extender: G Referring Physician: SELF, REFERRED Weeks in Treatment: 22 Visit Information History Since Last Visit All ordered tests and consults were completed: No Patient Arrived: Ambulatory Added or deleted any medications: No Arrival Time: 08:01 Any new allergies or adverse reactions: No Accompanied By: wife Had a fall or experienced change in No Transfer Assistance: None activities of daily living that may affect Patient Identification Verified: Yes risk of falls: Secondary Verification Process Yes Signs or symptoms of abuse/neglect since last No Completed: visito Patient Requires Transmission-Based No Hospitalized since last visit: No Precautions: Pain Present Now: No Patient Has Alerts: No Electronic Signature(s) Signed: 06/20/2016 5:37:51 PM By: Alejandro Mulling Entered By: Alejandro Mulling on 06/19/2016 08:02:06 [name] ([id number]) -------------------------------------------------------------------------------- Encounter Discharge Information Details [name] Date of Service: 06/19/2016 8:00 AM Patient Name: C. Patient Account Number: [id number] Medical Record Treating RN: [name] [id number] Number: Other Clinician: Date of Birth/Sex: Apr 06, 1946 (70 y.o. Male) Treating [name] Primary Care Physician: Physician/Extender: G Referring Physician: SELF, REFERRED Weeks in Treatment: 22 Encounter Discharge Information Items Discharge Pain Level: 0 Discharge Condition: Stable Ambulatory Status: Ambulatory Discharge Destination:  Home Private Transportation: Auto Accompanied By: wife Schedule Follow-up Appointment: Yes Medication Reconciliation completed and Yes provided to Patient/Care Donnalynn Wheeless: Clinical Summary of Care: Electronic Signature(s) Signed: 06/20/2016 5:37:51 PM By: Alejandro Mulling Entered By: Alejandro Mulling on 06/19/2016 08:21:04 [name] ([id number]) -------------------------------------------------------------------------------- Lower Extremity Assessment Details [name] Date of Service: 06/19/2016 8:00 AM Patient Name: C. Patient Account Number: [id number] Medical Record Treating RN: [name] [id number] Number: Other Clinician: Date of Birth/Sex: [birth year] (70 y.o. Male) Treating [name] Primary Care Physician: Physician/Extender: G Referring Physician: SELF, REFERRED Weeks in Treatment: 22 Edema Assessment Assessed: [Left: No] [Right: No] E[Left: dema] [Right: :] Calf Left: Right: Point of Measurement: 40 cm From Medial Instep cm 34 cm Ankle Left: Right: Point of Measurement: 12 cm From Medial Instep cm 23.6 cm Vascular Assessment Pulses: Posterior Tibial Dorsalis Pedis Palpable: [Right:Yes] Extremity colors, hair growth, and conditions: Extremity Color: [Right:Mottled] Temperature of Extremity: [Right:Warm] Capillary Refill: [Right:< 3 seconds] Toe Nail Assessment Left: Right: Thick: Yes Discolored: Yes Deformed: No Improper Length and Hygiene: Yes Electronic Signature(s) Signed: 06/20/2016 5:37:51 PM By: Alejandro Mulling Entered By: Alejandro Mulling on 06/19/2016 08:15:00 [name] ([id number]) [name], [name] ([id number]) -------------------------------------------------------------------------------- Multi Wound Chart Details [name] Date of Service: 06/19/2016 8:00 AM Patient Name: C. Patient Account Number: [id number] Medical Record Treating RN: [name] [id number] Number: Other  Clinician: Date of Birth/Sex: [birth year] (70 y.o. Male) Treating [name] Primary Care Physician: Physician/Extender: G Referring Physician: SELF, REFERRED Weeks in Treatment: 22 Vital Signs Height(in): 76 Pulse(bpm): 63 Weight(lbs): 238 Blood Pressure 143/60 (mmHg): Body Mass Index(BMI): 29 Temperature(F): 98.2 Respiratory Rate 18 (breaths/min): Photos: [N/A:N/A] Wound Location: Right Malleolus - Lateral Right Malleolus - Medial N/A Wounding Event: Gradually Appeared Gradually Appeared N/A Primary Etiology: Venous Leg Ulcer Venous Leg Ulcer N/A Comorbid History: Cataracts, Chronic Cataracts, Chronic N/A Obstructive Pulmonary Obstructive Pulmonary Disease (COPD), Disease (COPD), Osteoarthritis Osteoarthritis Date Acquired: 08/11/2015 01/30/2016 N/A Weeks of Treatment: 22 20 N/A Wound Status: Open Open N/A Clustered Wound: No Yes N/A Measurements L x W x D 7.5x6x0.2 5x3.7x0.2 N/A (cm) Area (cm) :  35.343 14.53 N/A Volume (cm) : 7.069 2.906 N/A % Reduction in Area: -104.50% -25.90% N/A % Reduction in Volume: -36.40% -151.60% N/A Classification: Full Thickness Without Full Thickness Without N/A Exposed Support Exposed Support Structures Structures Exudate Amount: Large Large N/A AKIN, YI. ([id number]) Exudate Type: Serosanguineous Serosanguineous N/A Exudate Color: red, brown red, brown N/A Foul Odor After Yes No N/A Cleansing: Odor Anticipated Due to No N/A N/A Product Use: Wound Margin: Thickened Distinct, outline attached N/A Granulation Amount: Medium (34-66%) Medium (34-66%) N/A Granulation Quality: Pink Pink N/A Necrotic Amount: Medium (34-66%) Medium (34-66%) N/A Exposed Structures: Fascia: No Fascia: No N/A Fat: No Fat: No Tendon: No Tendon: No Muscle: No Muscle: No Joint: No Joint: No Bone: No Bone: No Limited to Skin Limited to Skin Breakdown Breakdown Epithelialization: Small (1-33%) Medium (34-66%) N/A Periwound Skin  Texture: Scarring: Yes Scarring: Yes N/A Edema: No Edema: No Excoriation: No Excoriation: No Induration: No Induration: No Callus: No Callus: No Crepitus: No Crepitus: No Fluctuance: No Fluctuance: No Friable: No Friable: No Rash: No Rash: No Periwound Skin Maceration: Yes Maceration: Yes N/A Moisture: Moist: Yes Moist: Yes Dry/Scaly: No Dry/Scaly: No Periwound Skin Color: Atrophie Blanche: No Atrophie Blanche: No N/A Cyanosis: No Cyanosis: No Ecchymosis: No Ecchymosis: No Erythema: No Erythema: No Hemosiderin Staining: No Hemosiderin Staining: No Mottled: No Mottled: No Pallor: No Pallor: No Rubor: No Rubor: No Temperature: No Abnormality No Abnormality N/A Tenderness on No Yes N/A Palpation: Wound Preparation: Ulcer Cleansing: Other: Ulcer Cleansing: Other: N/A soap and water soap and water Topical Anesthetic Topical Anesthetic Applied: Other: lidocaine Applied: Other: lidocaine 4% 4% Treatment Notes EAN, GETTEL ([id number]) Electronic Signature(s) Signed: 06/20/2016 5:37:51 PM By: Alejandro Mulling Entered By: Alejandro Mulling on 06/19/2016 08:31:08 [name] ([id number]) -------------------------------------------------------------------------------- Multi-Disciplinary Care Plan Details [name] Date of Service: 06/19/2016 8:00 AM Patient Name: C. Patient Account Number: [id number] Medical Record Treating RN: [name] [id number] Number: Other Clinician: Date of Birth/Sex: 1945/09/16 (70 y.o. Male) Treating [name] Primary Care Physician: Physician/Extender: G Referring Physician: SELF, REFERRED Weeks in Treatment: 22 Active Inactive Venous Leg Ulcer Nursing Diagnoses: Knowledge deficit related to disease process and management Goals: Patient will maintain optimal edema control Date Initiated: 04/03/2016 Goal Status: Active Interventions: Compression as ordered Treatment Activities: Therapeutic  compression applied : 04/03/2016 Notes: Wound/Skin Impairment Nursing Diagnoses: Impaired tissue integrity Goals: Ulcer/skin breakdown will heal within 14 weeks Date Initiated: 01/17/2016 Goal Status: Active Interventions: Assess ulceration(s) every visit Notes: Electronic Signature(s) Signed: 06/20/2016 5:37:51 PM By: Aurelio Jew, [name] ([id number]) Entered By: Alejandro Mulling on 06/19/2016 08:18:20 [name] ([id number]) -------------------------------------------------------------------------------- Pain Assessment Details [name] Date of Service: 06/19/2016 8:00 AM Patient Name: C. Patient Account Number: [id number] Medical Record Treating RN: [name] [id number] Number: Other Clinician: Date of Birth/Sex: [birth year] (70 y.o. Male) Treating [name] Primary Care Physician: Physician/Extender: G Referring Physician: SELF, REFERRED Weeks in Treatment: 22 Active Problems Location of Pain Severity and Description of Pain Patient Has Paino No Site Locations With Dressing Change: No Pain Management and Medication Current Pain Management: Electronic Signature(s) Signed: 06/20/2016 5:37:51 PM By: Alejandro Mulling Entered By: Alejandro Mulling on 06/19/2016 08:02:13 [name] ([id number]) -------------------------------------------------------------------------------- Patient/Caregiver Education Details [name] Date of Service: 06/19/2016 8:00 AM Patient Name: C. Patient Account Number: [id number] Medical Record Treating RN: [name] [id number] Number: Other Clinician: Date of Birth/Gender: [birth year] (70 y.o. Male) Treating [name] Primary Care Physician: Physician/Extender: G Referring Physician: SELF, REFERRED Weeks in Treatment: 22 Education  Assessment Education Provided To: Patient Education Topics Provided Electronic Signature(s) Signed: 06/20/2016 5:37:51 PM By: Alejandro Mulling Entered By: Alejandro Mulling on 06/19/2016 08:21:16 [name] ([id number]) -------------------------------------------------------------------------------- Wound Assessment Details [name] Date of Service: 06/19/2016 8:00 AM Patient Name: C. Patient Account Number: [id number] Medical Record Treating RN: [name] [id number] Number: Other Clinician: Date of Birth/Sex: 1946-05-11 (70 y.o. Male) Treating [name] Primary Care Physician: Physician/Extender: G Referring Physician: SELF, REFERRED Weeks in Treatment: 22 Wound Status Wound Number: 1 Primary Venous Leg Ulcer Etiology: Wound Location: Right Malleolus - Lateral Wound Open Wounding Event: Gradually Appeared Status: Date Acquired: 08/11/2015 Comorbid Cataracts, Chronic Obstructive Weeks Of Treatment: 22 History: Pulmonary Disease (COPD), Clustered Wound: No Osteoarthritis Photos Photo Uploaded By: Alejandro Mulling on 06/19/2016 08:22:51 Wound Measurements Length: (cm) 7.5 Width: (cm) 6 Depth: (cm) 0.2 Area: (cm) 35.343 Volume: (cm) 7.069 % Reduction in Area: -104.5% % Reduction in Volume: -36.4% Epithelialization: Small (1-33%) Tunneling: No Undermining: No Wound Description Full Thickness Without Exposed Classification: Support Structures Wound Margin: Thickened Exudate Large Amount: Exudate Type: Serosanguineous Exudate Color: red, brown Foul Odor After Cleansing: Yes Due to Product Use: No Wound Bed Ayllon, Marcial C. ([id number]) Granulation Amount: Medium (34-66%) Exposed Structure Granulation Quality: Pink Fascia Exposed: No Necrotic Amount: Medium (34-66%) Fat Layer Exposed: No Necrotic Quality: Adherent Slough Tendon Exposed: No Muscle Exposed: No Joint Exposed: No Bone Exposed: No Limited to Skin Breakdown Periwound Skin Texture Texture Color No Abnormalities Noted: No No Abnormalities Noted: No Callus: No Atrophie Blanche: No Crepitus:  No Cyanosis: No Excoriation: No Ecchymosis: No Fluctuance: No Erythema: No Friable: No Hemosiderin Staining: No Induration: No Mottled: No Localized Edema: No Pallor: No Rash: No Rubor: No Scarring: Yes Temperature / Pain Moisture Temperature: No Abnormality No Abnormalities Noted: No Dry / Scaly: No Maceration: Yes Moist: Yes Wound Preparation Ulcer Cleansing: Other: soap and water, Topical Anesthetic Applied: Other: lidocaine 4%, Treatment Notes Wound #1 (Right, Lateral Malleolus) 1. Cleansed with: Clean wound with Normal Saline Cleanse wound with antibacterial soap and water 2. Anesthetic Topical Lidocaine 4% cream to wound bed prior to debridement 3. Peri-wound Care: Barrier cream Moisturizing lotion 4. Dressing Applied: Santyl Ointment 5. Secondary Dressing Applied ABD Pad Dry Gauze Blumenstock, Bertrand C. ([id number]) 7. Secured with Tape 4-Layer Compression System - Right Lower Extremity Notes drawtex Electronic Signature(s) Signed: 06/20/2016 5:37:51 PM By: Alejandro Mulling Entered By: Alejandro Mulling on 06/19/2016 08:16:23 [name] ([id number]) -------------------------------------------------------------------------------- Wound Assessment Details [name] Date of Service: 06/19/2016 8:00 AM Patient Name: C. Patient Account Number: [id number] Medical Record Treating RN: [name] [id number] Number: Other Clinician: Date of Birth/Sex: 1946-07-13 (70 y.o. Male) Treating [name] Primary Care Physician: Physician/Extender: G Referring Physician: SELF, REFERRED Weeks in Treatment: 22 Wound Status Wound Number: 2 Primary Venous Leg Ulcer Etiology: Wound Location: Right Malleolus - Medial Wound Open Wounding Event: Gradually Appeared Status: Date Acquired: 01/30/2016 Comorbid Cataracts, Chronic Obstructive Weeks Of Treatment: 20 History: Pulmonary Disease (COPD), Clustered Wound: Yes Osteoarthritis Photos Photo  Uploaded By: Alejandro Mulling on 06/19/2016 08:22:51 Wound Measurements Length: (cm) 5 Width: (cm) 3.7 Depth: (cm) 0.2 Area: (cm) 14.53 Volume: (cm) 2.906 % Reduction in Area: -25.9% % Reduction in Volume: -151.6% Epithelialization: Medium (34-66%) Tunneling: No Undermining: No Wound Description Full Thickness Without Exposed Classification: Support Structures Wound Margin: Distinct, outline attached Exudate Large Amount: Exudate Type: Serosanguineous Exudate Color: red, brown Foul Odor After Cleansing: No Wound Bed Ambrosio, Jesaiah C. ([id number]) Granulation Amount: Medium (34-66%) Exposed Structure Granulation Quality:  Pink Fascia Exposed: No Necrotic Amount: Medium (34-66%) Fat Layer Exposed: No Necrotic Quality: Adherent Slough Tendon Exposed: No Muscle Exposed: No Joint Exposed: No Bone Exposed: No Limited to Skin Breakdown Periwound Skin Texture Texture Color No Abnormalities Noted: No No Abnormalities Noted: No Callus: No Atrophie Blanche: No Crepitus: No Cyanosis: No Excoriation: No Ecchymosis: No Fluctuance: No Erythema: No Friable: No Hemosiderin Staining: No Induration: No Mottled: No Localized Edema: No Pallor: No Rash: No Rubor: No Scarring: Yes Temperature / Pain Moisture Temperature: No Abnormality No Abnormalities Noted: No Tenderness on Palpation: Yes Dry / Scaly: No Maceration: Yes Moist: Yes Wound Preparation Ulcer Cleansing: Other: soap and water, Topical Anesthetic Applied: Other: lidocaine 4%, Treatment Notes Wound #2 (Right, Medial Malleolus) 1. Cleansed with: Clean wound with Normal Saline Cleanse wound with antibacterial soap and water 2. Anesthetic Topical Lidocaine 4% cream to wound bed prior to debridement 3. Peri-wound Care: Barrier cream Moisturizing lotion 4. Dressing Applied: Santyl Ointment 5. Secondary Dressing Applied ABD Pad Dry Gauze Proehl, Jonas C. ([id number]) 7. Secured  with Tape 4-Layer Compression System - Right Lower Extremity Notes drawtex Electronic Signature(s) Signed: 06/20/2016 5:37:51 PM By: Alejandro MullingPinkerton, Debra Entered By: Alejandro MullingPinkerton, Debra on 06/19/2016 08:18:13 [name], Mikell C. ([id number]) -------------------------------------------------------------------------------- Vitals Details [name] RingerBLACKWELL, Eivan Date of Service: 06/19/2016 8:00 AM Patient Name: C. Patient Account Number: [id number] Medical Record Treating RN: Phillis Haggisinkerton, [name] [id number] Number: Other Clinician: Date of Birth/Sex: 06-12-1946 [age] y.o. Male) Treating [name] Primary Care Physician: Physician/Extender: G Referring Physician: SELF, REFERRED Weeks in Treatment: 22 Vital Signs Time Taken: 08:02 Temperature (F): 98.2 Height (in): 76 Pulse (bpm): 63 Weight (lbs): 238 Respiratory Rate (breaths/min): 18 Body Mass Index (BMI): 29 Blood Pressure (mmHg): 143/60 Reference Range: 80 - 120 mg / dl Electronic Signature(s) Signed: 06/20/2016 5:37:51 PM By: Alejandro MullingPinkerton, Debra Entered By: Alejandro MullingPinkerton, Debra on 06/19/2016 08:04:18

## 2016-06-22 DIAGNOSIS — I87331 Chronic venous hypertension (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: Secondary | ICD-10-CM | POA: Diagnosis not present

## 2016-06-25 NOTE — Progress Notes (Signed)
[name], [name] ([id number]) Visit Report for 06/22/2016 Arrival Information Details [name], [name] 06/22/2016 11:00 Patient Name: Date of Service: [name] Medical Record Patient Account Number: [id number] Number: Treating RN: [name] Date of Birth/Sex: February 28, 1946 [age] y.o. Male) Other Clinician: Primary Care Physician: Treating [name] Referring Physician: SELF, REFERRED Physician/Extender: [name] in Treatment: 22 Visit Information History Since Last Visit Added or deleted any medications: No Patient Arrived: Ambulatory Any new allergies or adverse reactions: No Arrival Time: 11:13 Had a fall or experienced change in No Accompanied By: wife activities of daily living that may affect Transfer Assistance: None risk of falls: Patient Identification Verified: Yes Signs or symptoms of abuse/neglect No Secondary Verification Process Yes since last visito Completed: Hospitalized since last visit: No Patient Requires Transmission-Based No Has Dressing in Place as Prescribed: Yes Precautions: Has Compression in Place as Yes Patient Has Alerts: No Prescribed: Has Footwear/Offloading in Place as Yes Prescribed: Right: Surgical Shoe with Pressure Relief Insole Pain Present Now: Yes Electronic Signature(s) Signed: 06/25/2016 3:33:39 PM By: [name] GurneyWoody, RN, [name], [name] Entered By: [name] on 06/22/2016 11:14:08 [name], [name] ([id number]) -------------------------------------------------------------------------------- Compression Therapy Details [name], [name] 06/22/2016 11:00 Patient Name: Date of Service: [name] Medical Record Patient Account Number: [id number] Number: Treating RN: [name] Date of Birth/Sex: February 28, 1946 [age] y.o. Male) Other Clinician: Primary Care Physician: Treating [name] Referring Physician: SELF, REFERRED Physician/Extender: [name] in Treatment: 22 Compression Therapy Performed for Wound  Wound #1 Right,Lateral Malleolus Assessment: Performed By: Clinician [name] CoventryWoody, Kim, RN Compression Type: Four Layer Pre Treatment ABI: 1.2 Electronic Signature(s) Signed: 06/25/2016 3:33:39 PM By: [name] GurneyWoody, RN, [name], [name] Entered By: [name] on 06/22/2016 11:39:01 [name], [name] ([id number]) -------------------------------------------------------------------------------- Encounter Discharge Information Details [name], [name] 06/22/2016 11:00 Patient Name: Date of Service: [name] Medical Record Patient Account Number: [id number] Number: Treating RN: [name] Date of Birth/Sex: February 28, 1946 [age] y.o. Male) Other Clinician: Primary Care Physician: Treating [name] Referring Physician: SELF, REFERRED Physician/Extender: Weeks in Treatment: 22 Encounter Discharge Information Items Discharge Pain Level: 0 Discharge Condition: Stable Ambulatory Status: Ambulatory Discharge Destination: Home Private Transportation: Auto Accompanied By: wife Schedule Follow-up Appointment: Yes Medication Reconciliation completed and Yes provided to Patient/Care [name]: Clinical Summary of Care: Electronic Signature(s) Signed: 06/25/2016 3:33:39 PM By: [name] GurneyWoody, RN, [name], [name] Entered By: [name] on 06/22/2016 11:40:59 [name], [name] ([id number]) -------------------------------------------------------------------------------- Patient/Caregiver Education Details [name], [name] 06/22/2016 11:00 Patient Name: Date of Service: [name] Medical Record Patient Account Number: [id number] Number: Treating RN: [name] Date of Birth/Gender: February 28, 1946 [age] y.o. Male) Other Clinician: Primary Care Physician: Treating [name] Referring Physician: SELF, REFERRED Physician/Extender: [name] in Treatment: 22 Education Assessment Education Provided To: Patient Education Topics Provided Wound/Skin Impairment: Handouts: Caring  for Your Ulcer, Other: do not get wrap wet Methods: Explain/Verbal Responses: State content correctly Electronic Signature(s) Signed: 06/25/2016 3:33:39 PM By: [name] GurneyWoody, RN, [name], [name] Entered By: [name] on 06/22/2016 11:40:45 [name], [name] ([id number]) -------------------------------------------------------------------------------- Wound Assessment Details [name], [name] 06/22/2016 11:00 Patient Name: Date of Service: [name] Medical Record Patient Account Number: [id number] Number: Treating RN: [name] Date of Birth/Sex: February 28, 1946 [age] y.o. Male) Other Clinician: Primary Care Physician: Treating [name] Referring Physician: SELF, REFERRED Physician/Extender: Weeks in Treatment: 22 Wound Status Wound Number: 1 Primary Etiology: Venous Leg Ulcer Wound Location: Right, Lateral Malleolus Wound Status: Open Wounding Event: Gradually Appeared Date Acquired: 08/11/2015 Weeks  Of Treatment: 22 Clustered Wound: No Wound Measurements Length: (cm) 6.5 Width: (cm) 5.8 Depth: (cm) 0.2 Area: (cm) 29.61 Volume: (cm) 5.922 % Reduction in Area: -71.4% % Reduction in Volume: -14.2% Wound Description Full Thickness Without Exposed Classification: Support Structures Periwound Skin Texture Texture Color No Abnormalities Noted: No No Abnormalities Noted: No Moisture No Abnormalities Noted: No Treatment Notes Wound #1 (Right, Lateral Malleolus) 1. Cleansed with: Cleanse wound with antibacterial soap and water 3. Peri-wound Care: Barrier cream 4. Dressing Applied: Santyl Ointment 7. Secured with 4-Layer Compression System - Left Lower Extremity Notes [name], [name] ([id number]) white paste to anchor; x-sorb; abd up shin for protection Electronic Signature(s) Signed: 06/25/2016 3:33:39 PM By: [name] GurneyWoody, RN, [name], [name] Entered By: [name] on 06/22/2016 11:38:33 [name], [name]  ([id number]) -------------------------------------------------------------------------------- Wound Assessment Details [name], [name] 06/22/2016 11:00 Patient Name: Date of Service: [name] Medical Record Patient Account Number: [id number] Number: Treating RN: [name] Date of Birth/Sex: 1946-08-12 [age] y.o. Male) Other Clinician: Primary Care Physician: Treating [name] Referring Physician: SELF, REFERRED Physician/Extender: Weeks in Treatment: 22 Wound Status Wound Number: 2 Primary Etiology: Venous Leg Ulcer Wound Location: Right, Medial Malleolus Wound Status: Open Wounding Event: Gradually Appeared Date Acquired: 01/30/2016 Weeks Of Treatment: 20 Clustered Wound: Yes Wound Measurements Length: (cm) 4.7 Width: (cm) 3.5 Depth: (cm) 0.2 Area: (cm) 12.92 Volume: (cm) 2.584 % Reduction in Area: -11.9% % Reduction in Volume: -123.7% Wound Description Full Thickness Without Exposed Classification: Support Structures Periwound Skin Texture Texture Color No Abnormalities Noted: No No Abnormalities Noted: No Moisture No Abnormalities Noted: No Treatment Notes Wound #2 (Right, Medial Malleolus) 1. Cleansed with: Cleanse wound with antibacterial soap and water 3. Peri-wound Care: Barrier cream 4. Dressing Applied: Santyl Ointment 7. Secured with 4-Layer Compression System - Left Lower Extremity Notes [name], [name] ([id number]) white paste to anchor; x-sorb; abd up shin for protection Electronic Signature(s) Signed: 06/25/2016 3:33:39 PM By: [name] GurneyWoody, RN, [name], [name] Entered By: [name] on 06/22/2016 11:38:34

## 2016-06-26 ENCOUNTER — Encounter: Payer: Medicare HMO | Admitting: Physician Assistant

## 2016-06-26 DIAGNOSIS — I87331 Chronic venous hypertension (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: Secondary | ICD-10-CM | POA: Diagnosis not present

## 2016-06-27 NOTE — Progress Notes (Signed)
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 he has no surrounding erythema or evidence of infection. Integumentary (Hair, Skin) Wound #1 status is Open. Original cause of wound was Gradually Appeared. The wound is located on the Right,Lateral Malleolus. The wound measures 6.2cm length x 7.5cm width x 0.2cm depth; 36.521cm^2 area and 7.304cm^3 volume. The wound is limited to skin breakdown. There is no tunneling or undermining noted. There is a large amount of serosanguineous drainage noted. The wound margin is distinct with the outline attached to the wound base. There is small (1-33%) red, pink granulation within the wound bed. There is a large (67-100%) amount of necrotic tissue within the wound bed including Adherent Slough. The periwound skin appearance exhibited: Moist. Periwound temperature was noted as No Abnormality. The periwound has tenderness on palpation. Wound #2 status is Open. Original cause of wound was Gradually Appeared. The wound is located on the Right,Medial Malleolus. The wound measures 5cm length x 3.5cm width x 0.2cm depth; 13.744cm^2 area and 2.749cm^3 volume. The wound is limited to skin breakdown. There is no tunneling noted. There is a large amount of serosanguineous  drainage noted. The wound margin is distinct with the outline attached to the wound base. There is small (1-33%) red, pink granulation within the wound bed. There is a large (67- 100%) amount of necrotic tissue within the wound bed including Adherent Slough. The periwound skin appearance exhibited: Moist. Periwound temperature was noted as No Abnormality. The periwound has tenderness on palpation. 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 There was a Skin/Subcutaneous Tissue Debridement ([id number]) debridement with total area of 46.5 sq cm performed by STONE III, HOYT E., PA-C. with the following instrument(s): Curette to remove Viable and Non-Viable tissue/material including Exudate, Fibrin/Slough, and Subcutaneous after achieving pain control using Lidocaine 4% Topical Solution. A time out was conducted at 08:23, prior to the start of the procedure. A Minimum amount of bleeding was controlled with Pressure. The procedure was tolerated well with a pain level of 0 throughout and a pain level of 0 following the procedure. Post Debridement Measurements: 6.5cm length x 7.2cm width x 0.3cm depth; 11.027cm^3 volume. Character of Wound/Ulcer Post Debridement requires further debridement. Severity of  Tissue Post Debridement is: Fat layer exposed. Post procedure Diagnosis Wound #1: Same as Pre-Procedure Wound #2 Wound #2 is a Venous Leg Ulcer located on the Right,Medial Malleolus . There was a Skin/Subcutaneous Tissue Debridement ([id number]) debridement with total area of 17.5 sq cm performed by STONE III, HOYT E., PA-C. with the following instrument(s): Curette to remove Viable and Non-Viable tissue/material including Exudate, Fibrin/Slough, and Subcutaneous after achieving pain control  using Lidocaine 4% Topical Solution. A time out was conducted at 08:23, prior to the start of the procedure. A Minimum amount of bleeding was controlled with Pressure. The procedure was tolerated well with a pain level of 0 throughout and a pain level of 0 following the procedure. Post Debridement Measurements: 4cm length x 3.5cm width x 0.3cm depth; 3.299cm^3 volume. Character of Wound/Ulcer Post Debridement requires further debridement. Severity of Tissue Post Debridement is: Fat layer exposed. Post procedure Diagnosis Wound #2: Same as Pre-Procedure Plan Wound Cleansing: Wound #1 Right,Lateral Malleolus: Dillen, Mahari C. ([id number]) Cleanse wound with mild soap and water May shower with protection. No tub bath. Wound #2 Right,Medial Malleolus: Cleanse wound with mild soap and water May shower with protection. No tub bath. Skin Barriers/Peri-Wound Care: Wound #1 Right,Lateral Malleolus: Barrier cream - zinc oxide Moisturizing lotion Wound #2 Right,Medial Malleolus: Barrier cream - zinc oxide Moisturizing lotion Primary Wound Dressing: Wound #1 Right,Lateral Malleolus: Hydrafera Blue Wound #2 Right,Medial Malleolus: Hydrafera Blue Secondary Dressing: Wound #1 Right,Lateral Malleolus: ABD pad Dry Gauze XtraSorb Wound #2 Right,Medial Malleolus: ABD pad Dry Gauze XtraSorb Follow-up Appointments: Wound #1 Right,Lateral Malleolus: Return Appointment in 1 week. Edema  Control: Wound #1 Right,Lateral Malleolus: 4-Layer Compression System - Right Lower Extremity - UNNA TO ANCHOR Wound #2 Right,Medial Malleolus: 4-Layer Compression System - Right Lower Extremity - UNNA TO ANCHOR Additional Orders / Instructions: Wound #1 Right,Lateral Malleolus: Increase protein intake. OK to return to work with the following restrictions: Activity as tolerated Wound #2 Right,Medial Malleolus: Increase protein intake. OK to return to work with the following restrictions: Activity as tolerated Follow-Up Appointments: A follow-up appointment should be scheduled. [name], Neno C. ([id number]) Medication Reconciliation completed and provided to Patient/Care Provider. At this point in time I do believe the Santyl has done what we wanted which was to clean up the surface. For that reason and since there is a lot of maceration around the wound I'm going to discontinue the Santyl with sharp debridement I was able to clear off the majority of the slough at this point. [facility] seemed to do significantly better for this patient in the past. we will continue with the 4-layer compression wrap as well. I will see him for reevaluation in one week to see where things stand at that point in time. All questions were encouraged and answered to the best of my ability today and patient was discharged in stable condition. The good news is there is no evidence of infection around the periwound region. Electronic Signature(s) Signed: 06/27/2016 1:37:38 AM By: Lenda KelpStone III, Hoyt PA-C Entered By: Lenda KelpStone III, Hoyt on 06/26/2016 09:05:34 [name], Anil C. ([id number]) -------------------------------------------------------------------------------- SuperBill Details Patient Name: [name], Kyce C. Date of Service: 06/26/2016 Medical Record Number: [id number] Patient Account Number: [id number] Date of Birth/Sex: 1945-12-24 [age] y.o. 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, initial T85.613A encounter Facility Procedures CPT4 Code Description: [id number] 11042 - DEB SUBQ TISSUE 20 SQ CM/< ICD-10 Description Diagnosis L97.313 Non-pressure chronic ulcer of right ankle with necros Modifier: is of muscle Quantity: 1 CPT4 Code Description: [id number] 11045 - DEB SUBQ TISS EA ADDL 20CM ICD-10  Description Diagnosis L97.313 Non-pressure chronic ulcer of right ankle with necros Modifier: is of muscle Quantity: 3 Physician Procedures CPT4 Code Description: [id number] 11042 - WC PHYS SUBQ TISS 20 SQ CM ICD-10 Description Diagnosis L97.313 Non-pressure chronic ulcer of right ankle with necros Modifier: is of muscle Quantity: 1 CPT4 Code Description: [id number] 11045 - WC PHYS SUBQ TISS EA ADDL 20 CM ICD-10 Description Diagnosis L97.313 Non-pressure chronic ulcer of right ankle with necros Modifier: is of muscle Quantity: 3 Electronic Signature(s) SAVINO, WHISENANT ([id number]) Signed: 06/27/2016 1:37:38 AM By: Lenda Kelp PA-C Entered By: Lenda Kelp on 06/26/2016 08:53:51

## 2016-06-27 NOTE — Progress Notes (Signed)
KEMON, DEVINCENZI ([id number]) Visit Report for 06/26/2016 Arrival Information Details Patient Name: [name], [name]. Date of Service: 06/26/2016 8:00 AM Medical Record Number: [id number] Patient Account Number: [id number] Date of Birth/Sex: 05-06-1946 (70 y.o. Male) Treating RN: [name] Primary Care Physician: Other Clinician: Referring Physician: SELF, REFERRED Treating Physician/Extender: STONE III, HOYT Weeks in Treatment: 23 Visit Information History Since Last Visit All ordered tests and consults were completed: No Patient Arrived: Ambulatory Added or deleted any medications: No Arrival Time: 08:04 Any new allergies or adverse reactions: No Accompanied By: self Had a fall or experienced change in No Transfer Assistance: None activities of daily living that may affect Patient Identification Verified: Yes risk of falls: Secondary Verification Process Yes Signs or symptoms of abuse/neglect since last No Completed: visito Patient Requires Transmission-Based No Hospitalized since last visit: No Precautions: Pain Present Now: No Patient Has Alerts: No Electronic Signature(s) Signed: 06/26/2016 5:06:01 PM By: Alejandro Mulling Entered By: Alejandro Mulling on 06/26/2016 08:04:28 [name] ([id number]) -------------------------------------------------------------------------------- Encounter Discharge Information Details Patient Name: [name] Date of Service: 06/26/2016 8:00 AM Medical Record Number: [id number] Patient Account Number: [id number] Date of Birth/Sex: 1946-03-15 (70 y.o. Male) Treating RN: [name] Primary Care Physician: Other Clinician: Referring Physician: SELF, REFERRED Treating Physician/Extender: STONE III, HOYT Weeks in Treatment: 23 Encounter Discharge Information Items Discharge Pain Level: 0 Discharge Condition: Stable Ambulatory Status: Ambulatory Discharge Destination: Home Transportation: Private  Auto Accompanied By: self Schedule Follow-up Appointment: Yes Medication Reconciliation completed Yes and provided to Patient/Care Shyvonne Chastang: Patient Clinical Summary of Care: Declined Electronic Signature(s) Signed: 06/26/2016 8:55:54 AM By: Gwenlyn Perking Entered By: Gwenlyn Perking on 06/26/2016 08:55:53 [name] ([id number]) -------------------------------------------------------------------------------- Lower Extremity Assessment Details Patient Name: [name] Date of Service: 06/26/2016 8:00 AM Medical Record Number: [id number] Patient Account Number: [id number] Date of Birth/Sex: March 12, 1946 (70 y.o. Male) Treating RN: [name] Primary Care Physician: Other Clinician: Referring Physician: SELF, REFERRED Treating Physician/Extender: STONE III, HOYT Weeks in Treatment: 23 Edema Assessment Assessed: [Left: No] [Right: No] E[Left: dema] [Right: :] Calf Left: Right: Point of Measurement: 40 cm From Medial Instep cm 34 cm Ankle Left: Right: Point of Measurement: 12 cm From Medial Instep cm 23.5 cm Vascular Assessment Pulses: Posterior Tibial Dorsalis Pedis Palpable: [Right:Yes] Extremity colors, hair growth, and conditions: Extremity Color: [Right:Mottled] Temperature of Extremity: [Right:Warm] Capillary Refill: [Right:< 3 seconds] Toe Nail Assessment Left: Right: Thick: Yes Discolored: Yes Deformed: No Improper Length and Hygiene: Yes Electronic Signature(s) Signed: 06/26/2016 5:06:01 PM By: Alejandro Mulling Entered By: Alejandro Mulling on 06/26/2016 08:12:47 [name] ([id number]) -------------------------------------------------------------------------------- Multi Wound Chart Details Patient Name: [name] Date of Service: 06/26/2016 8:00 AM Medical Record Number: [id number] Patient Account Number: [id number] Date of Birth/Sex: 1946/07/07 (70 y.o. Male) Treating RN: [name] Primary Care  Physician: Other Clinician: Referring Physician: SELF, REFERRED Treating Physician/Extender: STONE III, HOYT Weeks in Treatment: 23 Vital Signs Height(in): 76 Pulse(bpm): 69 Weight(lbs): 238 Blood Pressure 147/61 (mmHg): Body Mass Index(BMI): 29 Temperature(F): 98.1 Respiratory Rate 18 (breaths/min): Photos: [1:No Photos] [2:No Photos] [N/A:N/A] Wound Location: [1:Right Malleolus - Lateral] [2:Right Malleolus - Medial] [N/A:N/A] Wounding Event: [1:Gradually Appeared] [2:Gradually Appeared] [N/A:N/A] Primary Etiology: [1:Venous Leg Ulcer] [2:Venous Leg Ulcer] [N/A:N/A] Comorbid History: [1:Cataracts, Chronic Obstructive Pulmonary Disease (COPD), Osteoarthritis] [2:Cataracts, Chronic Obstructive Pulmonary Disease (COPD), Osteoarthritis] [N/A:N/A] Date Acquired: [1:08/11/2015] [2:01/30/2016] [N/A:N/A] Weeks of Treatment: [1:23] [2:21] [N/A:N/A] Wound Status: [1:Open] [2:Open] [N/A:N/A] Clustered Wound: [1:No] [2:Yes] [N/A:N/A] Measurements L x W x D 6.2x7.5x0.2 [2:5x3.5x0.2] [N/A:N/A] (cm)  Area (cm) : [1:36.521] [2:13.744] [N/A:N/A] Volume (cm) : [1:7.304] [2:2.749] [N/A:N/A] % Reduction in Area: [1:-111.40%] [2:-19.00%] [N/A:N/A] % Reduction in Volume: -40.90% [2:-138.00%] [N/A:N/A] Classification: [1:Full Thickness Without Exposed Support Structures] [2:Full Thickness Without Exposed Support Structures] [N/A:N/A] Exudate Amount: [1:Large] [2:Large] [N/A:N/A] Exudate Type: [1:Serosanguineous] [2:Serosanguineous] [N/A:N/A] Exudate Color: [1:red, brown] [2:red, brown] [N/A:N/A] Wound Margin: [1:Distinct, outline attached] [2:Distinct, outline attached] [N/A:N/A] Granulation Amount: [1:Small (1-33%)] [2:Small (1-33%)] [N/A:N/A] Granulation Quality: [1:Red, Pink] [2:Red, Pink] [N/A:N/A] Necrotic Amount: [1:Large (67-100%)] [2:Large (67-100%)] [N/A:N/A] Exposed Structures: [1:Fascia: No Fat: No] [2:Fascia: No Fat: No] [N/A:N/A] Tendon: No Tendon: No Muscle: No Muscle:  No Joint: No Joint: No Bone: No Bone: No Limited to Skin Limited to Skin Breakdown Breakdown Epithelialization: None None N/A Periwound Skin Texture: No Abnormalities Noted No Abnormalities Noted N/A Periwound Skin Moist: Yes Moist: Yes N/A Moisture: Periwound Skin Color: No Abnormalities Noted No Abnormalities Noted N/A Temperature: No Abnormality No Abnormality N/A Tenderness on Yes Yes N/A Palpation: Wound Preparation: Ulcer Cleansing: Ulcer Cleansing: N/A Rinsed/Irrigated with Rinsed/Irrigated with Saline Saline Topical Anesthetic Topical Anesthetic Applied: Other: lidocaine Applied: Other: lidocaine 4% 4% Treatment Notes Electronic Signature(s) Signed: 06/26/2016 5:06:01 PM By: Alejandro Mulling Entered By: Alejandro Mulling on 06/26/2016 08:23:20 [name] ([id number]) -------------------------------------------------------------------------------- Multi-Disciplinary Care Plan Details Patient Name: [name] Date of Service: 06/26/2016 8:00 AM Medical Record Number: [id number] Patient Account Number: [id number] Date of Birth/Sex: Dec 28, 1945 (70 y.o. Male) Treating RN: [name] Primary Care Physician: Other Clinician: Referring Physician: SELF, REFERRED Treating Physician/Extender: STONE III, HOYT Weeks in Treatment: 23 Active Inactive Venous Leg Ulcer Nursing Diagnoses: Knowledge deficit related to disease process and management Goals: Patient will maintain optimal edema control Date Initiated: 04/03/2016 Goal Status: Active Interventions: Compression as ordered Treatment Activities: Therapeutic compression applied : 04/03/2016 Notes: Wound/Skin Impairment Nursing Diagnoses: Impaired tissue integrity Goals: Ulcer/skin breakdown will heal within 14 weeks Date Initiated: 01/17/2016 Goal Status: Active Interventions: Assess ulceration(s) every visit Notes: Electronic Signature(s) Signed: 06/26/2016 5:06:01 PM By: Alejandro Mulling Entered By: Alejandro Mulling on 06/26/2016 08:20:21 [name] ([name], [name] ([id number]) -------------------------------------------------------------------------------- Pain Assessment Details Patient Name: [name] Date of Service: 06/26/2016 8:00 AM Medical Record Number: [id number] Patient Account Number: [id number] Date of Birth/Sex: April 23, 1946 (70 y.o. Male) Treating RN: [name] Primary Care Physician: Other Clinician: Referring Physician: SELF, REFERRED Treating Physician/Extender: STONE III, HOYT Weeks in Treatment: 23 Active Problems Location of Pain Severity and Description of Pain Patient Has Paino No Site Locations With Dressing Change: No Pain Management and Medication Current Pain Management: Electronic Signature(s) Signed: 06/26/2016 5:06:01 PM By: Alejandro Mulling Entered By: Alejandro Mulling on 06/26/2016 08:04:34 [name] ([id number]) -------------------------------------------------------------------------------- Patient/Caregiver Education Details Patient Name: [name] Date of Service: 06/26/2016 8:00 AM Medical Record Number: [id number] Patient Account Number: [id number] Date of Birth/Gender: 1945/12/28 (70 y.o. Male) Treating RN: [name] Primary Care Physician: Other Clinician: Referring Physician: SELF, REFERRED Treating Physician/Extender: Linwood Dibbles, HOYT Weeks in Treatment: 23 Education Assessment Education Provided To: Patient Education Topics Provided Wound/Skin Impairment: Handouts: Other: change dressing as ordered Methods: Demonstration, Explain/Verbal Responses: State content correctly Electronic Signature(s) Signed: 06/26/2016 5:06:01 PM By: Alejandro Mulling Entered By: Alejandro Mulling on 06/26/2016 08:35:41 [name], [name] ([id number]) -------------------------------------------------------------------------------- Wound Assessment  Details Patient Name: [name] Date of Service: 06/26/2016 8:00 AM Medical Record Number: [id number] Patient Account Number: [id number] Date of Birth/Sex: Jan 24, 1946 (70 y.o. Male) Treating RN: [name] Primary Care Physician: Other Clinician: Referring Physician: SELF, REFERRED Treating Physician/Extender:  STONE III, HOYT Weeks in Treatment: 23 Wound Status Wound Number: 1 Primary Venous Leg Ulcer Etiology: Wound Location: Right Malleolus - Lateral Wound Open Wounding Event: Gradually Appeared Status: Date Acquired: 08/11/2015 Comorbid Cataracts, Chronic Obstructive Weeks Of Treatment: 23 History: Pulmonary Disease (COPD), Clustered Wound: No Osteoarthritis Photos Photo Uploaded By: Alejandro MullingPinkerton, [name] on 06/26/2016 09:39:36 Wound Measurements Length: (cm) 6.2 Width: (cm) 7.5 Depth: (cm) 0.2 Area: (cm) 36.521 Volume: (cm) 7.304 % Reduction in Area: -111.4% % Reduction in Volume: -40.9% Epithelialization: None Tunneling: No Undermining: No Wound Description Full Thickness Without Exposed Classification: Support Structures Wound Margin: Distinct, outline attached Exudate Large Amount: Exudate Type: Serosanguineous Exudate Color: red, brown Foul Odor After Cleansing: No Wound Bed Granulation Amount: Small (1-33%) Exposed Structure Granulation Quality: Red, Pink Fascia Exposed: No [name], [name] ([id number]) Necrotic Amount: Large (67-100%) Fat Layer Exposed: No Necrotic Quality: Adherent Slough Tendon Exposed: No Muscle Exposed: No Joint Exposed: No Bone Exposed: No Limited to Skin Breakdown Periwound Skin Texture Texture Color No Abnormalities Noted: No No Abnormalities Noted: No Moisture Temperature / Pain No Abnormalities Noted: No Temperature: No Abnormality Moist: Yes Tenderness on Palpation: Yes Wound Preparation Ulcer Cleansing: Rinsed/Irrigated with Saline Topical Anesthetic Applied: Other: lidocaine 4%, Treatment  Notes Wound #1 (Right, Lateral Malleolus) 1. Cleansed with: Clean wound with Normal Saline Cleanse wound with antibacterial soap and water 2. Anesthetic Topical Lidocaine 4% cream to wound bed prior to debridement 3. Peri-wound Care: Barrier cream Moisturizing lotion 4. Dressing Applied: Hydrafera Blue 5. Secondary Dressing Applied ABD Pad Dry Gauze 7. Secured with Tape 4-Layer Compression System - Right Lower Extremity Notes xtrasorb Electronic Signature(s) Signed: 06/26/2016 5:06:01 PM By: Alejandro MullingPinkerton, [name] Entered By: Alejandro MullingPinkerton, [name] on 06/26/2016 08:20:13 [name] StackBLACKWELL, [name] ([id number]) -------------------------------------------------------------------------------- Wound Assessment Details Patient Name: [name] StackBLACKWELL, [name] Date of Service: 06/26/2016 8:00 AM Medical Record Number: [id number] Patient Account Number: [id number] Date of Birth/Sex: 04/06/1946 [age] y.o. Male) Treating RN: Phillis HaggisPinkerton, [name] Primary Care Physician: Other Clinician: Referring Physician: SELF, REFERRED Treating Physician/Extender: STONE III, HOYT Weeks in Treatment: 23 Wound Status Wound Number: 2 Primary Venous Leg Ulcer Etiology: Wound Location: Right Malleolus - Medial Wound Open Wounding Event: Gradually Appeared Status: Date Acquired: 01/30/2016 Comorbid Cataracts, Chronic Obstructive Weeks Of Treatment: 21 History: Pulmonary Disease (COPD), Clustered Wound: Yes Osteoarthritis Photos Photo Uploaded By: Alejandro MullingPinkerton, [name] on 06/26/2016 09:39:37 Wound Measurements Length: (cm) 5 Width: (cm) 3.5 Depth: (cm) 0.2 Area: (cm) 13.744 Volume: (cm) 2.749 % Reduction in Area: -19% % Reduction in Volume: -138% Epithelialization: None Tunneling: No Wound Description Full Thickness Without Exposed Classification: Support Structures Wound Margin: Distinct, outline attached Exudate Large Amount: Exudate Type: Serosanguineous Exudate Color: red, brown Foul Odor After Cleansing:  No Wound Bed Granulation Amount: Small (1-33%) Exposed Structure Granulation Quality: Red, Pink Fascia Exposed: No Pain, Khylon C. ([id number]) Necrotic Amount: Large (67-100%) Fat Layer Exposed: No Necrotic Quality: Adherent Slough Tendon Exposed: No Muscle Exposed: No Joint Exposed: No Bone Exposed: No Limited to Skin Breakdown Periwound Skin Texture Texture Color No Abnormalities Noted: No No Abnormalities Noted: No Moisture Temperature / Pain No Abnormalities Noted: No Temperature: No Abnormality Moist: Yes Tenderness on Palpation: Yes Wound Preparation Ulcer Cleansing: Rinsed/Irrigated with Saline Topical Anesthetic Applied: Other: lidocaine 4%, Treatment Notes Wound #2 (Right, Medial Malleolus) 1. Cleansed with: Clean wound with Normal Saline Cleanse wound with antibacterial soap and water 2. Anesthetic Topical Lidocaine 4% cream to wound bed prior to debridement 3. Peri-wound Care: Barrier cream Moisturizing lotion 4. Dressing Applied: Hydrafera Blue 5. Secondary  Dressing Applied ABD Pad Dry Gauze 7. Secured with Tape 4-Layer Compression System - Right Lower Extremity Notes xtrasorb Electronic Signature(s) Signed: 06/26/2016 5:06:01 PM By: Alejandro MullingPinkerton, [name] Entered By: Alejandro MullingPinkerton, [name] on 06/26/2016 08:18:56 [name] StackBLACKWELL, [name] ([id number]) -------------------------------------------------------------------------------- Vitals Details Patient Name: [name] StackBLACKWELL, [name] Date of Service: 06/26/2016 8:00 AM Medical Record Number: [id number] Patient Account Number: [id number] Date of Birth/Sex: 1946/05/24 12(70 y.o. Male) Treating RN: Phillis HaggisPinkerton, [name] Primary Care Physician: Other Clinician: Referring Physician: SELF, REFERRED Treating Physician/Extender: STONE III, HOYT Weeks in Treatment: 23 Vital Signs Time Taken: 08:04 Temperature (F): 98.1 Height (in): 76 Pulse (bpm): 69 Weight (lbs): 238 Respiratory Rate (breaths/min): 18 Body Mass Index  (BMI): 29 Blood Pressure (mmHg): 147/61 Reference Range: 80 - 120 mg / dl Electronic Signature(s) Signed: 06/26/2016 5:06:01 PM By: Alejandro MullingPinkerton, [name] Entered By: Alejandro MullingPinkerton, [name] on 06/26/2016 08:06:49

## 2016-06-29 ENCOUNTER — Encounter: Payer: Medicare HMO | Attending: Surgery

## 2016-06-29 DIAGNOSIS — T85613A 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, initial encounter: Secondary | ICD-10-CM | POA: Diagnosis not present

## 2016-06-29 DIAGNOSIS — Z86718 Personal history of other venous thrombosis and embolism: Secondary | ICD-10-CM | POA: Insufficient documentation

## 2016-06-29 DIAGNOSIS — J449 Chronic obstructive pulmonary disease, unspecified: Secondary | ICD-10-CM | POA: Insufficient documentation

## 2016-06-29 DIAGNOSIS — I87331 Chronic venous hypertension (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: Secondary | ICD-10-CM | POA: Insufficient documentation

## 2016-06-29 DIAGNOSIS — Z7901 Long term (current) use of anticoagulants: Secondary | ICD-10-CM | POA: Insufficient documentation

## 2016-06-29 DIAGNOSIS — L97212 Non-pressure chronic ulcer of right calf with fat layer exposed: Secondary | ICD-10-CM | POA: Insufficient documentation

## 2016-06-29 DIAGNOSIS — Y839 Surgical procedure, unspecified as the cause of abnormal reaction of the patient, or of later complication, without mention of misadventure at the time of the procedure: Secondary | ICD-10-CM | POA: Insufficient documentation

## 2016-06-30 NOTE — Progress Notes (Signed)
[name], [name] ([id number]) Visit Report for 06/29/2016 Arrival Information Details Patient Name: [name], [name] Date of Service: 06/29/2016 9:15 AM Medical Record Number: [id number] Patient Account Number: [id number] Date of Birth/Sex: 07/27/1946 [age] y.o. Male) Treating RN: Huel CoventryWoody, Kim Primary Care Physician: Other Clinician: Referring Physician: SELF, REFERRED Treating Physician/Extender: Rudene ReBritto, Errol Weeks in Treatment: 23 Visit Information History Since Last Visit Added or deleted any medications: No Patient Arrived: Ambulatory Any new allergies or adverse reactions: No Arrival Time: 09:27 Had a fall or experienced change in No Accompanied By: self activities of daily living that may affect Transfer Assistance: None risk of falls: Patient Identification Verified: Yes Signs or symptoms of abuse/neglect since last No Secondary Verification Process Yes visito Completed: Hospitalized since last visit: No Patient Requires Transmission-Based No Has Dressing in Place as Prescribed: Yes Precautions: Has Compression in Place as Prescribed: Yes Patient Has Alerts: No Pain Present Now: No Electronic Signature(s) Signed: 06/29/2016 10:50:42 AM By: Elliot GurneyWoody, RN, BSN, Kim RN, BSN Entered By: Elliot GurneyWoody, RN, BSN, Kim on 06/29/2016 09:36:39 [name], Nashton C. ([id number]) -------------------------------------------------------------------------------- Compression Therapy Details Patient Name: [name], [name] Date of Service: 06/29/2016 9:15 AM Medical Record Number: [id number] Patient Account Number: [id number] Date of Birth/Sex: 07/27/1946 [age] y.o. Male) Treating RN: Huel CoventryWoody, Kim Primary Care Physician: Other Clinician: Referring Physician: SELF, REFERRED Treating Physician/Extender: Rudene ReBritto, Errol Weeks in Treatment: 23 Compression Therapy Performed for Wound Wound #1 Right,Lateral Malleolus Assessment: Performed By: Cletus Gashlinician Woody, Kim, RN Compression Type: Four Layer Pre  Treatment ABI: 1.2 Electronic Signature(s) Signed: 06/29/2016 10:50:42 AM By: Elliot GurneyWoody, RN, BSN, Kim RN, BSN Entered By: Elliot GurneyWoody, RN, BSN, Kim on 06/29/2016 09:40:08 [name], [name] ([id number]) -------------------------------------------------------------------------------- Compression Therapy Details Patient Name: [name], [name] Date of Service: 06/29/2016 9:15 AM Medical Record Number: [id number] Patient Account Number: [id number] Date of Birth/Sex: 07/27/1946 [age] y.o. Male) Treating RN: Huel CoventryWoody, Kim Primary Care Physician: Other Clinician: Referring Physician: SELF, REFERRED Treating Physician/Extender: Rudene ReBritto, Errol Weeks in Treatment: 23 Compression Therapy Performed for Wound Wound #2 Right,Medial Malleolus Assessment: Performed By: Cletus Gashlinician Woody, Kim, RN Compression Type: Four Layer Pre Treatment ABI: 1.2 Electronic Signature(s) Signed: 06/29/2016 10:50:42 AM By: Elliot GurneyWoody, RN, BSN, Kim RN, BSN Entered By: Elliot GurneyWoody, RN, BSN, Kim on 06/29/2016 09:40:08 [name], Matteo C. ([id number]) -------------------------------------------------------------------------------- Encounter Discharge Information Details Patient Name: [name], [name] Date of Service: 06/29/2016 9:15 AM Medical Record Number: [id number] Patient Account Number: [id number] Date of Birth/Sex: 07/27/1946 [age] y.o. Male) Treating RN: Huel CoventryWoody, Kim Primary Care Physician: Other Clinician: Referring Physician: SELF, REFERRED Treating Physician/Extender: Rudene ReBritto, Errol Weeks in Treatment: 4723 Encounter Discharge Information Items Discharge Pain Level: 0 Discharge Condition: Stable Ambulatory Status: Ambulatory Discharge Destination: Home Private Transportation: Auto Accompanied By: self Schedule Follow-up Appointment: Yes Medication Reconciliation completed and Yes provided to Patient/Care Vidalia Serpas: Clinical Summary of Care: Electronic Signature(s) Signed: 06/29/2016 10:50:42 AM By: Elliot GurneyWoody, RN, BSN, Kim RN,  BSN Entered By: Elliot GurneyWoody, RN, BSN, Kim on 06/29/2016 09:57:23 [name], [name] ([id number]) -------------------------------------------------------------------------------- Patient/Caregiver Education Details Patient Name: [name], [name] Date of Service: 06/29/2016 9:15 AM Medical Record Number: [id number] Patient Account Number: [id number] Date of Birth/Gender: 07/27/1946 [age] y.o. Male) Treating RN: Huel CoventryWoody, Kim Primary Care Physician: Other Clinician: Referring Physician: SELF, REFERRED Treating Physician/Extender: Rudene ReBritto, Errol Weeks in Treatment: 9123 Education Assessment Education Provided To: Patient Education Topics Provided Wound/Skin Impairment: Handouts: Caring for Your Ulcer, Other: keep wraps dry Methods: Explain/Verbal Responses: State content correctly Electronic Signature(s) Signed: 06/29/2016 10:50:42 AM By: Elliot GurneyWoody, RN, BSN, Kim RN, BSN Entered By: Elliot GurneyWoody, RN, BSN,  Kim on 06/29/2016 09:57:09 [name], [name] ([id number]) -------------------------------------------------------------------------------- Wound Assessment Details Patient Name: [name], [name] Date of Service: 06/29/2016 9:15 AM Medical Record Number: [id number] Patient Account Number: [id number] Date of Birth/Sex: 01/11/1946 [age] y.o. Male) Treating RN: Huel CoventryWoody, Kim Primary Care Physician: Other Clinician: Referring Physician: SELF, REFERRED Treating Physician/Extender: Rudene ReBritto, Errol Weeks in Treatment: 23 Wound Status Wound Number: 1 Primary Etiology: Venous Leg Ulcer Wound Location: Right, Lateral Malleolus Wound Status: Open Wounding Event: Gradually Appeared Date Acquired: 08/11/2015 Weeks Of Treatment: 23 Clustered Wound: No Photos Photo Uploaded By: Elliot GurneyWoody, RN, BSN, Kim on 06/29/2016 10:29:09 Wound Measurements Length: (cm) 5.5 Width: (cm) 5 Depth: (cm) 0.2 Area: (cm) 21.598 Volume: (cm) 4.32 % Reduction in Area: -25% % Reduction in Volume: 16.7% Wound Description Full  Thickness Without Exposed Classification: Support Structures Periwound Skin Texture Texture Color No Abnormalities Noted: No No Abnormalities Noted: No Moisture No Abnormalities Noted: No Treatment Notes Wound #1 (Right, Lateral Malleolus) [place], Leiam C. ([id number]) 1. Cleansed with: Clean wound with Normal Saline Cleanse wound with antibacterial soap and water 2. Anesthetic Topical Lidocaine 4% cream to wound bed prior to debridement 3. Peri-wound Care: Barrier cream Moisturizing lotion 4. Dressing Applied: Hydrafera Blue 5. Secondary Dressing Applied ABD Pad 7. Secured with Tape 4-Layer Compression System - Right Lower Extremity Electronic Signature(s) Signed: 06/29/2016 10:50:42 AM By: Elliot GurneyWoody, RN, BSN, Kim RN, BSN Entered By: Elliot GurneyWoody, RN, BSN, Kim on 06/29/2016 08:65:7809:39:24 [name], Arend C. ([id number]) -------------------------------------------------------------------------------- Wound Assessment Details Patient Name: [name], Konstantin C. Date of Service: 06/29/2016 9:15 AM Medical Record Number: [id number] Patient Account Number: [id number] Date of Birth/Sex: 01/11/1946 [age] y.o. Male) Treating RN: Huel CoventryWoody, Kim Primary Care Physician: Other Clinician: Referring Physician: SELF, REFERRED Treating Physician/Extender: Rudene ReBritto, Errol Weeks in Treatment: 23 Wound Status Wound Number: 2 Primary Etiology: Venous Leg Ulcer Wound Location: Right, Medial Malleolus Wound Status: Open Wounding Event: Gradually Appeared Date Acquired: 01/30/2016 Weeks Of Treatment: 21 Clustered Wound: Yes Photos Photo Uploaded By: Elliot GurneyWoody, RN, BSN, Kim on 06/29/2016 10:29:10 Wound Measurements Length: (cm) 3.8 Width: (cm) 3 Depth: (cm) 0.2 Area: (cm) 8.954 Volume: (cm) 1.791 % Reduction in Area: 22.4% % Reduction in Volume: -55.1% Wound Description Full Thickness Without Exposed Classification: Support Structures Periwound Skin Texture Texture Color No Abnormalities Noted:  No No Abnormalities Noted: No Moisture No Abnormalities Noted: No Treatment Notes Wound #2 (Right, Medial Malleolus) [place], Solace C. ([id number]) 1. Cleansed with: Clean wound with Normal Saline Cleanse wound with antibacterial soap and water 2. Anesthetic Topical Lidocaine 4% cream to wound bed prior to debridement 3. Peri-wound Care: Barrier cream Moisturizing lotion 4. Dressing Applied: Hydrafera Blue 5. Secondary Dressing Applied ABD Pad 7. Secured with Tape 4-Layer Compression System - Right Lower Extremity Electronic Signature(s) Signed: 06/29/2016 10:50:42 AM By: Elliot GurneyWoody, RN, BSN, Kim RN, BSN Entered By: Elliot GurneyWoody, RN, BSN, Kim on 06/29/2016 03:47:4209:39:24

## 2016-07-03 ENCOUNTER — Encounter: Payer: Medicare HMO | Admitting: Physician Assistant

## 2016-07-03 DIAGNOSIS — I87331 Chronic venous hypertension (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: Secondary | ICD-10-CM | POA: Diagnosis not present

## 2016-07-04 ENCOUNTER — Ambulatory Visit: Payer: Medicare HMO | Admitting: Physician Assistant

## 2016-07-04 NOTE — Progress Notes (Signed)
[name], Kean C. ([id number]) Visit Report for 07/03/2016 Arrival Information Details Patient Name: [name], [name] Date of Service: 07/03/2016 8:00 AM Medical Record Number: [id number] Patient Account Number: [id number] Date of Birth/Sex: 03-Aug-1946 [age] y.o. Male) Treating RN: Phillis HaggisPinkerton, [name] Primary Care Physician: Other Clinician: Referring Physician: SELF, REFERRED Treating Physician/Extender: STONE III, HOYT Weeks in Treatment: 24 Visit Information History Since Last Visit All ordered tests and consults were completed: No Patient Arrived: Ambulatory Added or deleted any medications: No Arrival Time: 08:05 Any new allergies or adverse reactions: No Accompanied By: self Had a fall or experienced change in No Transfer Assistance: None activities of daily living that may affect Patient Identification Verified: Yes risk of falls: Secondary Verification Process Yes Signs or symptoms of abuse/neglect since last No Completed: visito Patient Requires Transmission-Based No Hospitalized since last visit: No Precautions: Pain Present Now: No Patient Has Alerts: No Electronic Signature(s) Signed: 07/03/2016 5:42:16 PM By: Alejandro MullingPinkerton, Debra Entered By: Alejandro MullingPinkerton, Debra on 07/03/2016 08:05:55 [name], Hammond C. ([id number]) -------------------------------------------------------------------------------- Encounter Discharge Information Details Patient Name: [name], [name] Date of Service: 07/03/2016 8:00 AM Medical Record Number: [id number] Patient Account Number: [id number] Date of Birth/Sex: 03-Aug-1946 [age] y.o. Male) Treating RN: Phillis HaggisPinkerton, [name] Primary Care Physician: Other Clinician: Referring Physician: SELF, REFERRED Treating Physician/Extender: STONE III, HOYT Weeks in Treatment: 24 Encounter Discharge Information Items Discharge Pain Level: 0 Discharge Condition: Stable Ambulatory Status: Ambulatory Discharge Destination: Home Transportation: Private  Auto Accompanied By: self Schedule Follow-up Appointment: Yes Medication Reconciliation completed Yes and provided to Patient/Care Leovardo Thoman: Patient Clinical Summary of Care: Declined Electronic Signature(s) Signed: 07/03/2016 9:00:27 AM By: Gwenlyn PerkingMoore, Shelia Entered By: Gwenlyn PerkingMoore, Shelia on 07/03/2016 09:00:27 [name], Tannar C. ([id number]) -------------------------------------------------------------------------------- Lower Extremity Assessment Details Patient Name: [name], [name] Date of Service: 07/03/2016 8:00 AM Medical Record Number: [id number] Patient Account Number: [id number] Date of Birth/Sex: 03-Aug-1946 [age] y.o. Male) Treating RN: Phillis HaggisPinkerton, [name] Primary Care Physician: Other Clinician: Referring Physician: SELF, REFERRED Treating Physician/Extender: STONE III, HOYT Weeks in Treatment: 24 Edema Assessment Assessed: [Left: No] [Right: No] E[Left: dema] [Right: :] Calf Left: Right: Point of Measurement: 40 cm From Medial Instep cm 34 cm Ankle Left: Right: Point of Measurement: 12 cm From Medial Instep cm 23 cm Vascular Assessment Pulses: Posterior Tibial Dorsalis Pedis Palpable: [Right:Yes] Extremity colors, hair growth, and conditions: Extremity Color: [Right:Hyperpigmented] Temperature of Extremity: [Right:Warm] Capillary Refill: [Right:< 3 seconds] Electronic Signature(s) Signed: 07/03/2016 5:42:16 PM By: Alejandro MullingPinkerton, Debra Entered By: Alejandro MullingPinkerton, Debra on 07/03/2016 08:14:34 [name], Jakob C. ([id number]) -------------------------------------------------------------------------------- Multi Wound Chart Details Patient Name: [name], [name] Date of Service: 07/03/2016 8:00 AM Medical Record Number: [id number] Patient Account Number: [id number] Date of Birth/Sex: 03-Aug-1946 [age] y.o. Male) Treating RN: Phillis HaggisPinkerton, [name] Primary Care Physician: Other Clinician: Referring Physician: SELF, REFERRED Treating Physician/Extender: STONE III, HOYT Weeks in  Treatment: 24 Vital Signs Height(in): 76 Pulse(bpm): 79 Weight(lbs): 238 Blood Pressure 145/68 (mmHg): Body Mass Index(BMI): 29 Temperature(F): 98.5 Respiratory Rate 18 (breaths/min): Photos: [1:No Photos] [2:No Photos] [N/A:N/A] Wound Location: [1:Right Malleolus - Lateral] [2:Right Malleolus - Medial] [N/A:N/A] Wounding Event: [1:Gradually Appeared] [2:Gradually Appeared] [N/A:N/A] Primary Etiology: [1:Venous Leg Ulcer] [2:Venous Leg Ulcer] [N/A:N/A] Comorbid History: [1:Cataracts, Chronic Obstructive Pulmonary Disease (COPD), Osteoarthritis] [2:Cataracts, Chronic Obstructive Pulmonary Disease (COPD), Osteoarthritis] [N/A:N/A] Date Acquired: [1:08/11/2015] [2:01/30/2016] [N/A:N/A] Weeks of Treatment: [1:24] [2:22] [N/A:N/A] Wound Status: [1:Open] [2:Open] [N/A:N/A] Clustered Wound: [1:No] [2:Yes] [N/A:N/A] Measurements L x W x D 5.3x6x0.2 [2:5.3x3.2x0.2] [N/A:N/A] (cm) Area (cm) : [1:24.976] [2:13.32] [N/A:N/A] Volume (cm) : [1:4.995] [2:2.664] [N/A:N/A] % Reduction in Area: [  1:-44.50%] [2:-15.40%] [N/A:N/A] % Reduction in Volume: 3.60% [2:-130.60%] [N/A:N/A] Classification: [1:Full Thickness Without Exposed Support Structures] [2:Full Thickness Without Exposed Support Structures] [N/A:N/A] Exudate Amount: [1:Large] [2:Large] [N/A:N/A] Exudate Type: [1:Serosanguineous] [2:Serosanguineous] [N/A:N/A] Exudate Color: [1:red, brown] [2:red, brown] [N/A:N/A] Wound Margin: [1:Distinct, outline attached] [2:Distinct, outline attached] [N/A:N/A] Granulation Amount: [1:Small (1-33%)] [2:Small (1-33%)] [N/A:N/A] Granulation Quality: [1:Red] [2:Red] [N/A:N/A] Necrotic Amount: [1:Large (67-100%)] [2:Large (67-100%)] [N/A:N/A] Exposed Structures: [1:Fascia: No Fat: No] [2:Fascia: No Fat: No] [N/A:N/A] Tendon: No Tendon: No Muscle: No Muscle: No Joint: No Joint: No Bone: No Bone: No Limited to Skin Limited to Skin Breakdown Breakdown Epithelialization: None None N/A Periwound  Skin Texture: No Abnormalities Noted No Abnormalities Noted N/A Periwound Skin Maceration: Yes Maceration: Yes N/A Moisture: Moist: Yes Moist: Yes Periwound Skin Color: No Abnormalities Noted No Abnormalities Noted N/A Temperature: No Abnormality No Abnormality N/A Tenderness on Yes Yes N/A Palpation: Wound Preparation: Ulcer Cleansing: Ulcer Cleansing: N/A Rinsed/Irrigated with Rinsed/Irrigated with Saline, Other: soap and Saline, Other: soap and water water Topical Anesthetic Topical Anesthetic Applied: Other: lidocaine Applied: Other: lidocaine 4% 4% Treatment Notes Electronic Signature(s) Signed: 07/03/2016 5:42:16 PM By: Alejandro MullingPinkerton, Debra Entered By: Alejandro MullingPinkerton, Debra on 07/03/2016 08:22:38 [name], Leonidus C. ([id number]) -------------------------------------------------------------------------------- Multi-Disciplinary Care Plan Details Patient Name: [name], Raybon C. Date of Service: 07/03/2016 8:00 AM Medical Record Number: [id number] Patient Account Number: [id number] Date of Birth/Sex: [birth year] [age] y.o. Male) Treating RN: Phillis HaggisPinkerton, [name] Primary Care Physician: Other Clinician: Referring Physician: SELF, REFERRED Treating Physician/Extender: STONE III, HOYT Weeks in Treatment: 24 Active Inactive Venous Leg Ulcer Nursing Diagnoses: Knowledge deficit related to disease process and management Goals: Patient will maintain optimal edema control Date Initiated: 04/03/2016 Goal Status: Active Interventions: Compression as ordered Treatment Activities: Therapeutic compression applied : 04/03/2016 Notes: Wound/Skin Impairment Nursing Diagnoses: Impaired tissue integrity Goals: Ulcer/skin breakdown will heal within 14 weeks Date Initiated: 01/17/2016 Goal Status: Active Interventions: Assess ulceration(s) every visit Notes: Electronic Signature(s) Signed: 07/03/2016 5:42:16 PM By: Alejandro MullingPinkerton, Debra Entered By: Alejandro MullingPinkerton, Debra on 07/03/2016 08:22:32 [name],  Winn C. ([name]) Hugill, [name] ([id number]) -------------------------------------------------------------------------------- Pain Assessment Details Patient Name: [name], Javonn C. Date of Service: 07/03/2016 8:00 AM Medical Record Number: [id number] Patient Account Number: [id number] Date of Birth/Sex: [birth year] [age] y.o. Male) Treating RN: Phillis HaggisPinkerton, [name] Primary Care Physician: Other Clinician: Referring Physician: SELF, REFERRED Treating Physician/Extender: STONE III, HOYT Weeks in Treatment: 24 Active Problems Location of Pain Severity and Description of Pain Patient Has Paino No Site Locations With Dressing Change: No Pain Management and Medication Current Pain Management: Electronic Signature(s) Signed: 07/03/2016 5:42:16 PM By: Alejandro MullingPinkerton, Debra Entered By: Alejandro MullingPinkerton, Debra on 07/03/2016 08:06:01 [name], Nethaniel C. ([id number]) -------------------------------------------------------------------------------- Patient/Caregiver Education Details Patient Name: [name], Azarion C. Date of Service: 07/03/2016 8:00 AM Medical Record Number: [id number] Patient Account Number: [id number] Date of Birth/Gender: [birth year] [age] y.o. Male) Treating RN: Phillis HaggisPinkerton, [name] Primary Care Physician: Other Clinician: Referring Physician: SELF, REFERRED Treating Physician/Extender: Linwood DibblesSTONE III, HOYT Weeks in Treatment: 24 Education Assessment Education Provided To: Patient Education Topics Provided Wound/Skin Impairment: Handouts: Other: keep wrap clean and dry Methods: Demonstration, Explain/Verbal Responses: State content correctly Electronic Signature(s) Signed: 07/03/2016 5:42:16 PM By: Alejandro MullingPinkerton, Debra Entered By: Alejandro MullingPinkerton, Debra on 07/03/2016 09:00:31 [name], Hikaru C. ([id number]) -------------------------------------------------------------------------------- Wound Assessment Details Patient Name: [name], Franciso C. Date of Service: 07/03/2016 8:00 AM Medical  Record Number: [id number] Patient Account Number: [id number] Date of Birth/Sex: [birth year] [age] y.o. Male) Treating RN: Phillis HaggisPinkerton, [name] Primary Care Physician: Other Clinician: Referring Physician: SELF, REFERRED Treating Physician/Extender: STONE III, HOYT Weeks in  Treatment: 24 Wound Status Wound Number: 1 Primary Venous Leg Ulcer Etiology: Wound Location: Right Malleolus - Lateral Wound Open Wounding Event: Gradually Appeared Status: Date Acquired: 08/11/2015 Comorbid Cataracts, Chronic Obstructive Weeks Of Treatment: 24 History: Pulmonary Disease (COPD), Clustered Wound: No Osteoarthritis Photos Photo Uploaded By: Alejandro Mulling on 07/03/2016 11:14:53 Wound Measurements Length: (cm) 5.3 Width: (cm) 6 Depth: (cm) 0.2 Area: (cm) 24.976 Volume: (cm) 4.995 % Reduction in Area: -44.5% % Reduction in Volume: 3.6% Epithelialization: None Tunneling: No Undermining: No Wound Description Full Thickness Without Exposed Classification: Support Structures Wound Margin: Distinct, outline attached Exudate Large Amount: Exudate Type: Serosanguineous Exudate Color: red, brown Wound Bed Granulation Amount: Small (1-33%) Exposed Structure Granulation Quality: Red Fascia Exposed: No Sammarco, Volney C. ([id number]) Necrotic Amount: Large (67-100%) Fat Layer Exposed: No Necrotic Quality: Adherent Slough Tendon Exposed: No Muscle Exposed: No Joint Exposed: No Bone Exposed: No Limited to Skin Breakdown Periwound Skin Texture Texture Color No Abnormalities Noted: No No Abnormalities Noted: No Moisture Temperature / Pain No Abnormalities Noted: No Temperature: No Abnormality Maceration: Yes Tenderness on Palpation: Yes Moist: Yes Wound Preparation Ulcer Cleansing: Rinsed/Irrigated with Saline, Other: soap and water, Topical Anesthetic Applied: Other: lidocaine 4%, Treatment Notes Wound #1 (Right, Lateral Malleolus) 1. Cleansed with: Clean wound with Normal  Saline Cleanse wound with antibacterial soap and water 2. Anesthetic Topical Lidocaine 4% cream to wound bed prior to debridement 3. Peri-wound Care: Barrier cream Moisturizing lotion 4. Dressing Applied: Hydrafera Blue 5. Secondary Dressing Applied ABD Pad Dry Gauze 7. Secured with Tape 4-Layer Compression System - Right Lower Extremity Notes xtrasorb, charcoal Electronic Signature(s) Signed: 07/03/2016 5:42:16 PM By: Alejandro Mulling Entered By: Alejandro Mulling on 07/03/2016 08:20:47 [name] Stack ([id number]) -------------------------------------------------------------------------------- Wound Assessment Details Patient Name: [name] Stack Date of Service: 07/03/2016 8:00 AM Medical Record Number: [id number] Patient Account Number: [id number] Date of Birth/Sex: 1946-08-05 (70 y.o. Male) Treating RN: [name] Primary Care Physician: Other Clinician: Referring Physician: SELF, REFERRED Treating Physician/Extender: STONE III, HOYT Weeks in Treatment: 24 Wound Status Wound Number: 2 Primary Venous Leg Ulcer Etiology: Wound Location: Right Malleolus - Medial Wound Open Wounding Event: Gradually Appeared Status: Date Acquired: 01/30/2016 Comorbid Cataracts, Chronic Obstructive Weeks Of Treatment: 22 History: Pulmonary Disease (COPD), Clustered Wound: Yes Osteoarthritis Photos Photo Uploaded By: Alejandro Mulling on 07/03/2016 11:15:25 Wound Measurements Length: (cm) 5.3 Width: (cm) 3.2 Depth: (cm) 0.2 Area: (cm) 13.32 Volume: (cm) 2.664 % Reduction in Area: -15.4% % Reduction in Volume: -130.6% Epithelialization: None Tunneling: No Undermining: No Wound Description Full Thickness Without Exposed Classification: Support Structures Wound Margin: Distinct, outline attached Exudate Large Amount: Exudate Type: Serosanguineous Exudate Color: red, brown Foul Odor After Cleansing: No Wound Bed Granulation Amount: Small (1-33%) Exposed  Structure Granulation Quality: Red Fascia Exposed: No Paiz, [name] C. ([id number]) Necrotic Amount: Large (67-100%) Fat Layer Exposed: No Necrotic Quality: Adherent Slough Tendon Exposed: No Muscle Exposed: No Joint Exposed: No Bone Exposed: No Limited to Skin Breakdown Periwound Skin Texture Texture Color No Abnormalities Noted: No No Abnormalities Noted: No Moisture Temperature / Pain No Abnormalities Noted: No Temperature: No Abnormality Maceration: Yes Tenderness on Palpation: Yes Moist: Yes Wound Preparation Ulcer Cleansing: Rinsed/Irrigated with Saline, Other: soap and water, Topical Anesthetic Applied: Other: lidocaine 4%, Treatment Notes Wound #2 (Right, Medial Malleolus) 1. Cleansed with: Clean wound with Normal Saline Cleanse wound with antibacterial soap and water 2. Anesthetic Topical Lidocaine 4% cream to wound bed prior to debridement 3. Peri-wound Care: Barrier cream Moisturizing lotion 4. Dressing Applied: Hydrafera  Blue 5. Secondary Dressing Applied ABD Pad Dry Gauze 7. Secured with Tape 4-Layer Compression System - Right Lower Extremity Notes xtrasorb, charcoal Electronic Signature(s) Signed: 07/03/2016 5:42:16 PM By: Alejandro Mulling Entered By: Alejandro Mulling on 07/03/2016 08:21:19 [name] Stack ([id number]) -------------------------------------------------------------------------------- Vitals Details Patient Name: [name] Stack Date of Service: 07/03/2016 8:00 AM Medical Record Number: [id number] Patient Account Number: [id number] Date of Birth/Sex: 09/20/1945 (70 y.o. Male) Treating RN: [name] Primary Care Physician: Other Clinician: Referring Physician: SELF, REFERRED Treating Physician/Extender: STONE III, HOYT Weeks in Treatment: 24 Vital Signs Time Taken: 08:06 Temperature (F): 98.5 Height (in): 76 Pulse (bpm): 79 Weight (lbs): 238 Respiratory Rate (breaths/min): 18 Body Mass Index (BMI):  29 Blood Pressure (mmHg): 145/68 Reference Range: 80 - 120 mg / dl Electronic Signature(s) Signed: 07/03/2016 5:42:16 PM By: Alejandro Mulling Entered By: Alejandro Mulling on 07/03/2016 08:07:51

## 2016-07-04 NOTE — Progress Notes (Signed)
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 the right lower extremity. Psychiatric this patient is able to make decisions and demonstrates good insight into disease process. Alert and Oriented x 3. pleasant and cooperative. General Notes: Patient's wound appears to be somewhat sloughed covered over the medial and lateral malleolus locations. With that being said this is significantly improved over last week and not nearly as tender as last week. I was able to perform debridement without him having any significant discomfort which was not possible last week he had a difficult time getting through it. No evidence of infection Integumentary (Hair, Skin) Wound #1 status is Open. Original cause of wound was Gradually Appeared. The wound is located on the Right,Lateral Malleolus. The wound measures 5.3cm length x 6cm width x 0.2cm depth; 24.976cm^2 area and 4.995cm^3 volume. The wound is limited to skin breakdown. There is no tunneling or undermining noted. There is a large amount of serosanguineous drainage noted. The wound margin is distinct with the outline attached to the wound base. There is small (1-33%) red granulation within the wound bed. There is a large (67-100%) amount of necrotic tissue within the wound bed including Adherent Slough. The periwound skin appearance exhibited: Maceration, Moist. Periwound  temperature was noted as No Abnormality. The periwound has tenderness on palpation. Wound #2 status is Open. Original cause of wound was Gradually Appeared. The wound is located on the Right,Medial Malleolus. The wound measures 5.3cm length x 3.2cm width x 0.2cm depth; 13.32cm^2 area and 2.664cm^3 volume. The wound is limited to skin breakdown. There is no tunneling or undermining noted. There is a large amount of serosanguineous drainage noted. The wound margin is distinct with the outline attached to the wound base. There is small (1-33%) red granulation within the wound bed. There is a large (67-100%) amount of necrotic tissue within the wound bed including Adherent Slough. The periwound skin appearance exhibited: Maceration, Moist. Periwound temperature was noted as No Abnormality. The periwound has tenderness on palpation. 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 There was a Skin/Subcutaneous Tissue Debridement ([id number]) debridement with total area of 33.6 sq cm performed by STONE III, HOYT E., PA-C. with the following instrument(s): Curette to remove Viable and Non-Viable tissue/material including Exudate, Fibrin/Slough, and Subcutaneous after achieving pain control using Lidocaine 4% Topical Solution. A time out was conducted at 08:35, prior to the start of the procedure. A Minimum amount of bleeding was controlled with Pressure. The procedure was tolerated well with a pain level of 0 throughout and a pain  level of 0 following the procedure. Post Debridement Measurements: 5.6cm length x 6cm width x 0.25cm depth; 6.597cm^3 volume. Character of Wound/Ulcer Post Debridement requires further debridement. Severity of Tissue Post Debridement is: Fat layer exposed. Post procedure Diagnosis Wound #1: Same as  Pre-Procedure Wound #2 Wound #2 is a Venous Leg Ulcer located on the Right,Medial Malleolus . There was a Skin/Subcutaneous Tissue Debridement ([id number]) debridement with total area of 16.96 sq cm performed by STONE III, HOYT E., PA-C. with the following instrument(s): Curette to remove Viable and Non-Viable tissue/material including Exudate, Fibrin/Slough, and Subcutaneous after achieving pain control using Lidocaine 4% Topical Solution. A time out was conducted at 08:35, prior to the start of the procedure. A Minimum amount of bleeding was controlled with Pressure. The procedure was tolerated well with a pain level of 0 throughout and a pain level of 0 following the procedure. Post Debridement Measurements: 5.3cm length x 3.2cm width x 0.25cm depth; 3.33cm^3 volume. Character of Wound/Ulcer Post Debridement requires further debridement. Severity of Tissue Post Debridement is: Fat layer exposed. Post procedure Diagnosis Wound #2: Same as Pre-Procedure Plan Wound Cleansing: Wound #1 Right,Lateral Malleolus: Cleanse wound with mild soap and water DEAUNDRA, KUTZER ([id number]) May shower with protection. No tub bath. Wound #2 Right,Medial Malleolus: Cleanse wound with mild soap and water May shower with protection. No tub bath. Skin Barriers/Peri-Wound Care: Wound #1 Right,Lateral Malleolus: Barrier cream - zinc oxide Moisturizing lotion Wound #2 Right,Medial Malleolus: Barrier cream - zinc oxide Moisturizing lotion Primary Wound Dressing: Wound #1 Right,Lateral Malleolus: Hydrafera Blue Wound #2 Right,Medial Malleolus: Hydrafera Blue Secondary Dressing: Wound #1 Right,Lateral  Malleolus: ABD pad Dry Gauze - charcoal XtraSorb Wound #2 Right,Medial Malleolus: ABD pad Dry Gauze - charcoal XtraSorb Follow-up Appointments: Wound #1 Right,Lateral Malleolus: Return Appointment in 1 week. Edema Control: Wound #1 Right,Lateral Malleolus: 4-Layer Compression System - Right Lower Extremity - UNNA TO ANCHOR Wound #2 Right,Medial Malleolus: 4-Layer Compression System - Right Lower Extremity - UNNA TO ANCHOR Additional Orders / Instructions: Wound #1 Right,Lateral Malleolus: Increase protein intake. OK to return to work with the following restrictions: Activity as tolerated Wound #2 Right,Medial Malleolus: Increase protein intake. OK to return to work with the following restrictions: Activity as tolerated Pettitt, Geremy C. ([id number]) At this point in time him do recommend that we continue with the current treatment which includes the [facility] covered with an ABD pad, Xtrasorb, and a 4-layer compression wrap. We are going to let him continue with this for the next week and will see him for reevaluation in one week. Hopefully this will continue to improve. I'm pleased with how things have progressed even over last week until this week. Electronic Signature(s) Signed: 07/04/2016 12:52:14 AM By: Lenda Kelp PA-C Entered By: Lenda Kelp on 07/03/2016 08:56:20 [name] Stack ([id number]) -------------------------------------------------------------------------------- SuperBill Details Patient Name: [name] Stack Date of Service: 07/03/2016 Medical Record Number: [id number] Patient Account Number: [id number] Date of Birth/Sex: 10-25-1945 (70 y.o. 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, initial T85.613A encounter Facility Procedures CPT4 Code: [id number] Description: 11042 - DEB SUBQ TISSUE 20 SQ CM/< ICD-10 Description Diagnosis L97.212 Non-pressure chronic ulcer of right calf with fat la Modifier: yer exposed Quantity: 1 CPT4 Code: [id number] Description: 11045 - DEB SUBQ TISS EA ADDL 20CM ICD-10 Description Diagnosis L97.212 Non-pressure chronic ulcer of right calf with fat la Modifier: yer  exposed Quantity: 2 Physician Procedures CPT4 Code: [id number] Description: 11042 - WC PHYS SUBQ TISS 20 SQ CM ICD-10 Description Diagnosis L97.212 Non-pressure chronic ulcer of right calf with fat la Modifier: yer exposed Quantity: 1 CPT4 Code: [id number] Sterry, L Description: 11045 - WC PHYS SUBQ TISS EA ADDL 20 CM ICD-10 Description Diagnosis L97.212 Non-pressure chronic ulcer of right calf with fat la Tillman Abide ([id number]) Modifier: yer exposed Quantity: 2 Electronic Signature(s) Signed: 07/04/2016 12:52:14 AM By: Lenda Kelp PA-C Entered By: Lenda Kelp on 07/03/2016 08:57:07

## 2016-07-06 ENCOUNTER — Ambulatory Visit: Payer: Medicare HMO

## 2016-07-10 ENCOUNTER — Encounter: Payer: Medicare HMO | Admitting: Internal Medicine

## 2016-07-10 DIAGNOSIS — I87331 Chronic venous hypertension (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: Secondary | ICD-10-CM | POA: Diagnosis not present

## 2016-07-11 NOTE — Progress Notes (Signed)
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 within target range for patient.Marland Kitchen Respirations regular, non-labored and within target range.. Temperature is normal and within the target range for the patient.. Patient's appearance is neat and clean. Appears in no acute distress. Well nourished and well developed.. Vitals Time Taken: 8:00 AM, Height: 76 in, Weight: 238 lbs, BMI: 29, Temperature: 97.8 F, Pulse: 65 bpm, Respiratory Rate: 18 breaths/min, Blood Pressure: 154/64 mmHg. Cardiovascular Pedal pulses palpable and strong bilaterally.. General Notes: Wound exam; debridement performed with a curet. Due to the presence of surface slough nonviable subcutaneous tissue. On the lateral ankle now in the inferior part of this wound there is rolled edges which may need to be addressed at some point. Careful attention to  the progression of the wound measurements. The medial wound is measuring smaller also require debridement with a curette for the same reasons. There is no evidence of surrounding infection Integumentary (Hair, Skin) Wound #1 status is Open. Original cause of wound was Gradually Appeared. The wound is located on the Right,Lateral Malleolus. The wound measures 6cm length x 6.5cm width x 0.2cm depth; 30.631cm^2 area and 6.126cm^3 volume. The wound is limited to skin breakdown. There is no tunneling or undermining noted. There is a large amount of serosanguineous drainage noted. The wound margin is distinct with the outline attached to the wound base. There is medium (34-66%) red granulation within the wound bed. There is a medium (34-66%) amount of necrotic tissue within the wound bed including Adherent Slough. The periwound skin appearance exhibited: Maceration, Moist. Periwound temperature was noted as No Abnormality. The periwound has tenderness on palpation. Wound #2 status is Open. Original cause of wound was Gradually Appeared. The wound is located on the Right,Medial Malleolus. The wound measures 5cm length x 3cm width x 0.2cm depth; 11.781cm^2 area and 2.356cm^3 volume. The wound is limited to skin breakdown. There is no tunneling or undermining noted. There is a large amount of serosanguineous drainage noted. The wound margin is distinct with the outline attached to the wound base. There is small (1-33%) red granulation within the wound bed. There is a large (67-100%) amount of necrotic tissue within the wound bed including Adherent Slough. The periwound skin appearance exhibited: Maceration, Moist. Periwound temperature was noted as No Abnormality. The periwound has tenderness on palpation. 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 There was a Skin/Subcutaneous Tissue Debridement ([id number]) debridement with total area of 39  sq cm performed by [name] Caul, MD. with the following instrument(s): scoop to remove Viable and Non-Viable tissue/material including Exudate and Fibrin/Slough after achieving pain control using Lidocaine 4% Topical Solution. A time out was conducted at 08:22, prior to the start of the procedure. A Minimum amount of bleeding was controlled with Pressure. The procedure was tolerated well with a pain level of 0 throughout and a pain level of 0 following the procedure. Post Debridement Measurements: 6cm length x 6.5cm width x 0.2cm depth; 6.126cm^3 volume. Character of Wound/Ulcer Post Debridement requires further debridement. Severity of Tissue Post Debridement is: Fat layer exposed. Post procedure Diagnosis Wound #1: Same as Pre-Procedure Wound #2 Wound #2 is a Venous Leg Ulcer located on the Right,Medial Malleolus . There was a Skin/Subcutaneous Tissue Debridement ([id number]) debridement with total area of 15 sq cm performed by [name] Caul, MD. with the following instrument(s): scoop to remove Viable and Non-Viable tissue/material including Exudate and Fibrin/Slough after achieving pain control using Lidocaine 4% Topical Solution. A time out was conducted at 08:22, prior to the start of the procedure. A Minimum amount of bleeding was controlled with Pressure. The procedure was tolerated well with a pain level of 0 throughout and a pain level of 0 following the procedure. Post Debridement Measurements: 5cm length x 3cm width x 0.2cm depth; 2.356cm^3  volume. Character of Wound/Ulcer Post Debridement requires further debridement. Severity of Tissue Post Debridement is: Fat layer exposed. Post procedure Diagnosis Wound #2: Same as Pre-Procedure Plan Wound Cleansing: PRIMUS, GRITTON. ([id number]) Wound #1 Right,Lateral Malleolus: Cleanse wound with mild soap and water May shower with protection. No tub bath. Wound #2 Right,Medial Malleolus: Cleanse wound with mild soap and water May shower with protection. No tub bath. Skin Barriers/Peri-Wound Care: Wound #1 Right,Lateral Malleolus: Barrier cream - zinc oxide Moisturizing lotion Wound #2 Right,Medial Malleolus: Barrier cream - zinc oxide Moisturizing lotion Primary Wound Dressing: Wound #1 Right,Lateral Malleolus: Hydrafera Blue Wound #2 Right,Medial Malleolus: Hydrafera Blue Secondary Dressing: Wound #1 Right,Lateral Malleolus: ABD pad Dry Gauze - charcoal XtraSorb Wound #2 Right,Medial Malleolus: ABD pad Dry Gauze - charcoal XtraSorb Follow-up Appointments: Wound #1 Right,Lateral Malleolus: Return Appointment in 1 week. Edema Control: Wound #1 Right,Lateral Malleolus: 4-Layer Compression System - Right Lower Extremity - UNNA TO ANCHOR Wound #2 Right,Medial Malleolus: 4-Layer Compression System - Right Lower Extremity - UNNA TO ANCHOR Additional Orders / Instructions: Wound #1 Right,Lateral Malleolus: Increase protein intake. OK to return to work with the following restrictions: Activity as tolerated Wound #2 Right,Medial Malleolus: Increase protein intake. OK to return to work with the following restrictions: Activity as tolerated [name], [name] ([id number]) #1 where continuing with the [facility] which seems to be resulting in gradual improvement. He no longer requires a dressing change. Wound as secondary dressings of xtrasorb, ABDs Electronic Signature(s) Signed: 07/10/2016 4:42:06 PM By: [name] Najjar MD Entered By: [name] on  07/10/2016 08:31:34 [name] ([id number]) -------------------------------------------------------------------------------- SuperBill Details [name] Date of Service: 07/10/2016 Patient Name: C. Patient Account Number: [id number] Medical Record Treating RN: [name] [id number] Number: Other Clinician: Date of Birth/Sex: 03-12-1946 (70 y.o. 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 Breakdown (mechanical) of artificial skin graft  and decellularized allodermis, initial T85.613A encounter Facility Procedures CPT4: Description Modifier Quantity Code [id number] 11042 - DEB SUBQ TISSUE 20 SQ CM/< 1 ICD-10 Description Diagnosis L97.212 Non-pressure chronic ulcer of right calf with fat layer exposed I87.331 Chronic venous hypertension (idiopathic) with ulcer and  inflammation of right lower extremity CPT4: [id number] 11045 - DEB SUBQ TISS EA ADDL 20CM 2 ICD-10 Description Diagnosis L97.212 Non-pressure chronic ulcer of right calf with fat layer exposed Physician Procedures CPT4: Description Modifier Quantity Code [id number] 11042 - WC PHYS SUBQ TISS 20 SQ CM 1 ICD-10 Description Diagnosis L97.212 Non-pressure chronic ulcer of right calf with fat layer exposed I87.331 Chronic venous hypertension (idiopathic) with ulcer and  inflammation of right lower extremity [name] StackBLACKWELL, [name] ([id number]) Electronic Signature(s) Signed: 07/10/2016 4:42:06 PM By: [name] Najjarobson, Valina Maes MD Entered By: [name] Najjarobson, Montzerrat Brunell on 07/10/2016 08:32:19

## 2016-07-11 NOTE — Progress Notes (Signed)
[name], Traveon C. ([id number]) Visit Report for 07/10/2016 Arrival Information Details [name], [name] Date of Service: 07/10/2016 8:00 AM [name] Name: [name] Account Number: [id number] Medical Record Treating RN: Phillis Haggisinkerton, [name] [id number] Number: Other Clinician: Date of Birth/Sex: 15-Dec-1945 6(70 y.o. Male) Treating [name], [name] Primary Care Physician: Physician/Extender: G Referring Physician: SELF, REFERRED Weeks in Treatment: 25 Visit Information History Since Last Visit All ordered tests and consults were completed: No [name] Arrived: Ambulatory Added or deleted any medications: No Arrival Time: 07:59 Any new allergies or adverse reactions: No Accompanied By: self Had a fall or experienced change in No Transfer Assistance: None activities of daily living that may affect [name] Identification Verified: Yes risk of falls: Secondary Verification Process Yes Signs or symptoms of abuse/neglect since last No Completed: visito [name] Requires Transmission-Based No Hospitalized since last visit: No Precautions: Pain Present Now: No [name] Has Alerts: No Electronic Signature(s) Signed: 07/10/2016 4:24:36 PM By: Alejandro MullingPinkerton, Debra Entered By: Alejandro MullingPinkerton, Debra on 07/10/2016 08:00:01 [name], [name] ([id number]) -------------------------------------------------------------------------------- Encounter Discharge Information Details [name], [name] Date of Service: 07/10/2016 8:00 AM [name] Name: [name] Account Number: [id number] Medical Record Treating RN: Phillis Haggisinkerton, [name] [id number] Number: Other Clinician: Date of Birth/Sex: 15-Dec-1945 [age] y.o. Male) Treating [name], [name] Primary Care Physician: Physician/Extender: G Referring Physician: SELF, REFERRED Weeks in Treatment: 25 Encounter Discharge Information Items Discharge Pain Level: 0 Discharge Condition: Stable Ambulatory Status: Ambulatory Discharge Destination: Home Transportation:  Private Auto Accompanied By: self Schedule Follow-up Appointment: Yes Medication Reconciliation completed Yes and provided to [name]/Care Dorwin Fitzhenry: [name] Clinical Summary of Care: Declined Electronic Signature(s) Signed: 07/10/2016 8:49:09 AM By: Gwenlyn PerkingMoore, Shelia Entered By: Gwenlyn PerkingMoore, Shelia on 07/10/2016 08:49:09 [name], [name] ([id number]) -------------------------------------------------------------------------------- Lower Extremity Assessment Details [name], Quy Date of Service: 07/10/2016 8:00 AM [name] Name: [name] Account Number: [id number] Medical Record Treating RN: Phillis Haggisinkerton, [name] [id number] Number: Other Clinician: Date of Birth/Sex: 15-Dec-1945 [age] y.o. Male) Treating [name], [name] Primary Care Physician: Physician/Extender: G Referring Physician: SELF, REFERRED Weeks in Treatment: 25 Edema Assessment Assessed: [Left: No] [Right: No] E[Left: dema] [Right: :] Calf Left: Right: Point of Measurement: 40 cm From Medial Instep cm 34 cm Ankle Left: Right: Point of Measurement: 12 cm From Medial Instep cm 23.2 cm Vascular Assessment Pulses: Posterior Tibial Dorsalis Pedis Palpable: [Right:Yes] Extremity colors, hair growth, and conditions: Extremity Color: [Right:Hyperpigmented] Temperature of Extremity: [Right:Warm] Capillary Refill: [Right:< 3 seconds] Toe Nail Assessment Left: Right: Thick: Yes Discolored: Yes Deformed: Yes Improper Length and Hygiene: No Electronic Signature(s) Signed: 07/10/2016 4:24:36 PM By: Alejandro MullingPinkerton, Debra Entered By: Alejandro MullingPinkerton, Debra on 07/10/2016 08:07:50 [name], Kurt C. ([id number]) [name], [name] -------------------------------------------------------------------------------- Multi Wound Chart Details [name], [name] Date of Service: 07/10/2016 8:00 AM [name] Name: [name] Account Number: [id number] Medical Record Treating RN: Phillis Haggisinkerton, [name] [id number] Number: Other  Clinician: Date of Birth/Sex: 15-Dec-1945 [age] y.o. Male) Treating [name], [name] Primary Care Physician: Physician/Extender: G Referring Physician: SELF, REFERRED Weeks in Treatment: 25 Vital Signs Height(in): 76 Pulse(bpm): 65 Weight(lbs): 238 Blood Pressure 154/64 (mmHg): Body Mass Index(BMI): 29 Temperature(F): 97.8 Respiratory Rate 18 (breaths/min): Photos: [1:No Photos] [2:No Photos] [N/A:N/A] Wound Location: [1:Right Malleolus - Lateral] [2:Right Malleolus - Medial] [N/A:N/A] Wounding Event: [1:Gradually Appeared] [2:Gradually Appeared] [N/A:N/A] Primary Etiology: [1:Venous Leg Ulcer] [2:Venous Leg Ulcer] [N/A:N/A] Comorbid History: [1:Cataracts, Chronic Obstructive Pulmonary Disease (COPD), Osteoarthritis] [2:Cataracts, Chronic Obstructive Pulmonary Disease (COPD), Osteoarthritis] [N/A:N/A] Date Acquired: [1:08/11/2015] [2:01/30/2016] [N/A:N/A] Weeks of Treatment: [1:25] [2:23] [N/A:N/A] Wound Status: [1:Open] [2:Open] [N/A:N/A] Clustered Wound: [1:No] [2:Yes] [N/A:N/A] Measurements L x W x D  6x6.5x0.2 [2:5x3x0.2] [N/A:N/A] (cm) Area (cm) : [1:30.631] [2:11.781] [N/A:N/A] Volume (cm) : [1:6.126] [2:2.356] [N/A:N/A] % Reduction in Area: [1:-77.30%] [2:-2.00%] [N/A:N/A] % Reduction in Volume: -18.20% [2:-104.00%] [N/A:N/A] Classification: [1:Full Thickness Without Exposed Support Structures] [2:Full Thickness Without Exposed Support Structures] [N/A:N/A] Exudate Amount: [1:Large] [2:Large] [N/A:N/A] Exudate Type: [1:Serosanguineous] [2:Serosanguineous] [N/A:N/A] Exudate Color: [1:red, brown] [2:red, brown] [N/A:N/A] Wound Margin: [1:Distinct, outline attached] [2:Distinct, outline attached] [N/A:N/A] Granulation Amount: [1:Medium (34-66%)] [2:Small (1-33%)] [N/A:N/A] Granulation Quality: [1:Red] [2:Red] [N/A:N/A] Necrotic Amount: [1:Medium (34-66%)] [2:Large (67-100%)] [N/A:N/A] Exposed Structures: Fascia: No Fascia: No N/A Fat: No Fat: No Tendon: No Tendon:  No Muscle: No Muscle: No Joint: No Joint: No Bone: No Bone: No Limited to Skin Limited to Skin Breakdown Breakdown Epithelialization: None None N/A Periwound Skin Texture: No Abnormalities Noted No Abnormalities Noted N/A Periwound Skin Maceration: Yes Maceration: Yes N/A Moisture: Moist: Yes Moist: Yes Periwound Skin Color: No Abnormalities Noted No Abnormalities Noted N/A Temperature: No Abnormality No Abnormality N/A Tenderness on Yes Yes N/A Palpation: Wound Preparation: Ulcer Cleansing: Ulcer Cleansing: N/A Rinsed/Irrigated with Rinsed/Irrigated with Saline, Other: soap and Saline, Other: soap and water water Topical Anesthetic Topical Anesthetic Applied: Other: lidocaine Applied: Other: lidocaine 4% 4% Treatment Notes Electronic Signature(s) Signed: 07/10/2016 4:24:36 PM By: Alejandro Mulling Entered By: Alejandro Mulling on 07/10/2016 08:15:48 [name] Stack ([id number]) -------------------------------------------------------------------------------- Multi-Disciplinary Care Plan Details [name] Ringer Date of Service: 07/10/2016 8:00 AM [name] Name: [name] Account Number: [id number] Medical Record Treating RN: [name] [id number] Number: Other Clinician: Date of Birth/Sex: 1946/07/29 (70 y.o. Male) Treating [name], [name] Primary Care Physician: Physician/Extender: G Referring Physician: SELF, REFERRED Weeks in Treatment: 25 Active Inactive Venous Leg Ulcer Nursing Diagnoses: Knowledge deficit related to disease process and management Goals: [name] will maintain optimal edema control Date Initiated: 04/03/2016 Goal Status: Active Interventions: Compression as ordered Treatment Activities: Therapeutic compression applied : 04/03/2016 Notes: Wound/Skin Impairment Nursing Diagnoses: Impaired tissue integrity Goals: Ulcer/skin breakdown will heal within 14 weeks Date Initiated: 01/17/2016 Goal Status: Active Interventions: Assess  ulceration(s) every visit Notes: Electronic Signature(s) Signed: 07/10/2016 4:24:36 PM By: Aurelio Jew, [name] ([id number]) Entered By: Alejandro Mulling on 07/10/2016 08:15:42 [name] Stack ([id number]) -------------------------------------------------------------------------------- Pain Assessment Details [name] Ringer Date of Service: 07/10/2016 8:00 AM [name] Name: [name] Account Number: [id number] Medical Record Treating RN: [name] [id number] Number: Other Clinician: Date of Birth/Sex: [birth year] (70 y.o. Male) Treating [name], [name] Primary Care Physician: Physician/Extender: G Referring Physician: SELF, REFERRED Weeks in Treatment: 25 Active Problems Location of Pain Severity and Description of Pain [name] Has Paino No Site Locations With Dressing Change: No Pain Management and Medication Current Pain Management: Electronic Signature(s) Signed: 07/10/2016 4:24:36 PM By: Alejandro Mulling Entered By: Alejandro Mulling on 07/10/2016 08:00:08 [name] Stack ([id number]) -------------------------------------------------------------------------------- [name]/Caregiver Education Details [name] Ringer Date of Service: 07/10/2016 8:00 AM [name] Name: [name] Account Number: [id number] Medical Record Treating RN: [name] [id number] Number: Other Clinician: Date of Birth/Gender: [birth year] (70 y.o. Male) Treating [name], [name] Primary Care Physician: Physician/Extender: G Referring Physician: SELF, REFERRED Weeks in Treatment: 25 Education Assessment Education Provided To: [name] Education Topics Provided Wound/Skin Impairment: Handouts: Other: change dressing as ordered and do not get dressing wet Methods: Demonstration, Explain/Verbal Responses: State content correctly Electronic Signature(s) Signed: 07/10/2016 4:24:36 PM By: Alejandro Mulling Entered By: Alejandro Mulling on 07/10/2016  08:17:46 [name] Stack ([id number]) -------------------------------------------------------------------------------- Wound Assessment Details [name] Ringer Date of Service: 07/10/2016 8:00 AM [name] Name: [name] Account Number: [id number] Medical Record Treating RN: [name]  [id number] Number: Other Clinician: Date of Birth/Sex: 1946-02-13 [age] y.o. Male) Treating [name], [name] Primary Care Physician: Physician/Extender: G Referring Physician: SELF, REFERRED Weeks in Treatment: 25 Wound Status Wound Number: 1 Primary Venous Leg Ulcer Etiology: Wound Location: Right Malleolus - Lateral Wound Open Wounding Event: Gradually Appeared Status: Date Acquired: 08/11/2015 Comorbid Cataracts, Chronic Obstructive Weeks Of Treatment: 25 History: Pulmonary Disease (COPD), Clustered Wound: No Osteoarthritis Photos Photo Uploaded By: Alejandro MullingPinkerton, Debra on 07/10/2016 08:21:17 Wound Measurements Length: (cm) 6 Width: (cm) 6.5 Depth: (cm) 0.2 Area: (cm) 30.631 Volume: (cm) 6.126 % Reduction in Area: -77.3% % Reduction in Volume: -18.2% Epithelialization: None Tunneling: No Undermining: No Wound Description Full Thickness Without Exposed Classification: Support Structures Wound Margin: Distinct, outline attached Exudate Large Amount: Exudate Type: Serosanguineous Exudate Color: red, brown Wound Bed Granulation Amount: Medium (34-66%) Exposed Structure Granulation Quality: Red Fascia Exposed: No Paradis, Alrick C. ([id number]) Necrotic Amount: Medium (34-66%) Fat Layer Exposed: No Necrotic Quality: Adherent Slough Tendon Exposed: No Muscle Exposed: No Joint Exposed: No Bone Exposed: No Limited to Skin Breakdown Periwound Skin Texture Texture Color No Abnormalities Noted: No No Abnormalities Noted: No Moisture Temperature / Pain No Abnormalities Noted: No Temperature: No Abnormality Maceration: Yes Tenderness on Palpation: Yes Moist:  Yes Wound Preparation Ulcer Cleansing: Rinsed/Irrigated with Saline, Other: soap and water, Topical Anesthetic Applied: Other: lidocaine 4%, Treatment Notes Wound #1 (Right, Lateral Malleolus) 1. Cleansed with: Clean wound with Normal Saline Cleanse wound with antibacterial soap and water 2. Anesthetic Topical Lidocaine 4% cream to wound bed prior to debridement 3. Peri-wound Care: Moisturizing lotion 4. Dressing Applied: Hydrafera Blue 5. Secondary Dressing Applied ABD Pad Dry Gauze Notes xtrasorb, charcoal Electronic Signature(s) Signed: 07/10/2016 4:24:36 PM By: Alejandro MullingPinkerton, Debra Entered By: Alejandro MullingPinkerton, Debra on 07/10/2016 08:14:39 [name], [name] ([id number]) -------------------------------------------------------------------------------- Wound Assessment Details [name], Cathan Date of Service: 07/10/2016 8:00 AM [name] Name: [name] Account Number: [id number] Medical Record Treating RN: Phillis Haggisinkerton, [name] [id number] Number: Other Clinician: Date of Birth/Sex: 1946-02-13 [age] y.o. Male) Treating [name], [name] Primary Care Physician: Physician/Extender: G Referring Physician: SELF, REFERRED Weeks in Treatment: 25 Wound Status Wound Number: 2 Primary Venous Leg Ulcer Etiology: Wound Location: Right Malleolus - Medial Wound Open Wounding Event: Gradually Appeared Status: Date Acquired: 01/30/2016 Comorbid Cataracts, Chronic Obstructive Weeks Of Treatment: 23 History: Pulmonary Disease (COPD), Clustered Wound: Yes Osteoarthritis Photos Photo Uploaded By: Alejandro MullingPinkerton, Debra on 07/10/2016 08:21:17 Wound Measurements Length: (cm) 5 Width: (cm) 3 Depth: (cm) 0.2 Area: (cm) 11.781 Volume: (cm) 2.356 % Reduction in Area: -2% % Reduction in Volume: -104% Epithelialization: None Tunneling: No Undermining: No Wound Description Full Thickness Without Exposed Classification: Support Structures Wound Margin: Distinct, outline  attached Exudate Large Amount: Exudate Type: Serosanguineous Exudate Color: red, brown Foul Odor After Cleansing: No Wound Bed Granulation Amount: Small (1-33%) Exposed Structure Granulation Quality: Red Fascia Exposed: No [place], Lamari C. ([id number]) Necrotic Amount: Large (67-100%) Fat Layer Exposed: No Necrotic Quality: Adherent Slough Tendon Exposed: No Muscle Exposed: No Joint Exposed: No Bone Exposed: No Limited to Skin Breakdown Periwound Skin Texture Texture Color No Abnormalities Noted: No No Abnormalities Noted: No Moisture Temperature / Pain No Abnormalities Noted: No Temperature: No Abnormality Maceration: Yes Tenderness on Palpation: Yes Moist: Yes Wound Preparation Ulcer Cleansing: Rinsed/Irrigated with Saline, Other: soap and water, Topical Anesthetic Applied: Other: lidocaine 4%, Treatment Notes Wound #2 (Right, Medial Malleolus) 1. Cleansed with: Clean wound with Normal Saline Cleanse wound with antibacterial soap and water 2. Anesthetic Topical Lidocaine 4% cream to wound bed prior to  debridement 3. Peri-wound Care: Moisturizing lotion 4. Dressing Applied: Hydrafera Blue 5. Secondary Dressing Applied ABD Pad Dry Gauze Notes xtrasorb, charcoal Electronic Signature(s) Signed: 07/10/2016 4:24:36 PM By: Alejandro Mulling Entered By: Alejandro Mulling on 07/10/2016 08:13:25 [name] Stack ([id number]) -------------------------------------------------------------------------------- Vitals Details [name] Ringer Date of Service: 07/10/2016 8:00 AM [name] Name: [name] Account Number: [id number] Medical Record Treating RN: [name] [id number] Number: Other Clinician: Date of Birth/Sex: [birth year] (70 y.o. Male) Treating [name], [name] Primary Care Physician: Physician/Extender: G Referring Physician: SELF, REFERRED Weeks in Treatment: 25 Vital Signs Time Taken: 08:00 Temperature (F): 97.8 Height (in): 76 Pulse  (bpm): 65 Weight (lbs): 238 Respiratory Rate (breaths/min): 18 Body Mass Index (BMI): 29 Blood Pressure (mmHg): 154/64 Reference Range: 80 - 120 mg / dl Electronic Signature(s) Signed: 07/10/2016 4:24:36 PM By: Alejandro Mulling Entered By: Alejandro Mulling on 07/10/2016 08:01:57

## 2016-07-17 ENCOUNTER — Encounter: Payer: Medicare HMO | Admitting: Nurse Practitioner

## 2016-07-17 DIAGNOSIS — I87331 Chronic venous hypertension (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: Secondary | ICD-10-CM | POA: Diagnosis not present

## 2016-07-18 NOTE — Progress Notes (Signed)
[name], [name] ([id number]) Visit Report for 07/17/2016 Arrival Information Details Patient Name: [name], [name]. Date of Service: 07/17/2016 8:00 AM Medical Record Number: [id number] Patient Account Number: [id number] Date of Birth/Sex: 27-Feb-1946 (70 y.o. Male) Treating RN: [name] Primary Care Physician: Other Clinician: Referring Physician: Treating Physician/Extender: Kathreen Cosier in Treatment: 26 Visit Information History Since Last Visit All ordered tests and consults were completed: No Patient Arrived: Ambulatory Added or deleted any medications: No Arrival Time: 08:01 Any new allergies or adverse reactions: No Accompanied By: self Had a fall or experienced change in No Transfer Assistance: None activities of daily living that may affect Patient Identification Verified: Yes risk of falls: Secondary Verification Process Yes Signs or symptoms of abuse/neglect since last No Completed: visito Patient Requires Transmission-Based No Hospitalized since last visit: No Precautions: Pain Present Now: No Patient Has Alerts: No Electronic Signature(s) Signed: 07/17/2016 4:33:45 PM By: Alejandro Mulling Entered By: Alejandro Mulling on 07/17/2016 08:02:05 [name] ([id number]) -------------------------------------------------------------------------------- Encounter Discharge Information Details Patient Name: [name] Date of Service: 07/17/2016 8:00 AM Medical Record Number: [id number] Patient Account Number: [id number] Date of Birth/Sex: 1946-03-12 (70 y.o. Male) Treating RN: [name] Primary Care Physician: Other Clinician: Referring Physician: Treating Physician/Extender: Kathreen Cosier in Treatment: 64 Encounter Discharge Information Items Discharge Pain Level: 0 Discharge Condition: Stable Ambulatory Status: Ambulatory Discharge Destination: Home Transportation: Private Auto Accompanied By: self Schedule  Follow-up Appointment: Yes Medication Reconciliation completed Yes and provided to Patient/Care Larinda Herter: Patient Clinical Summary of Care: Declined Electronic Signature(s) Signed: 07/17/2016 8:59:29 AM By: Gwenlyn Perking Entered By: Gwenlyn Perking on 07/17/2016 08:59:29 [name] ([id number]) -------------------------------------------------------------------------------- Lower Extremity Assessment Details Patient Name: [name] Date of Service: 07/17/2016 8:00 AM Medical Record Number: [id number] Patient Account Number: [id number] Date of Birth/Sex: 1945-10-29 (70 y.o. Male) Treating RN: [name] Primary Care Physician: Other Clinician: Referring Physician: Treating Physician/Extender: Kathreen Cosier in Treatment: 26 Edema Assessment Assessed: [Left: No] [Right: No] E[Left: dema] [Right: :] Calf Left: Right: Point of Measurement: 40 cm From Medial Instep cm 34.5 cm Ankle Left: Right: Point of Measurement: 12 cm From Medial Instep cm 23 cm Vascular Assessment Pulses: Posterior Tibial Dorsalis Pedis Palpable: [Right:Yes] Extremity colors, hair growth, and conditions: Extremity Color: [Right:Hyperpigmented] Temperature of Extremity: [Right:Warm] Capillary Refill: [Right:< 3 seconds] Toe Nail Assessment Left: Right: Thick: Yes Discolored: Yes Deformed: Yes Improper Length and Hygiene: No Electronic Signature(s) Signed: 07/17/2016 4:33:45 PM By: Alejandro Mulling Entered By: Alejandro Mulling on 07/17/2016 08:10:27 [name] ([id number]) -------------------------------------------------------------------------------- Multi Wound Chart Details Patient Name: [name] Date of Service: 07/17/2016 8:00 AM Medical Record Number: [id number] Patient Account Number: [id number] Date of Birth/Sex: 02-18-1946 (71 y.o. Male) Treating RN: [name] Primary Care Physician: Other Clinician: Referring Physician: Treating  Physician/Extender: Kathreen Cosier in Treatment: 26 Vital Signs Height(in): 76 Pulse(bpm): 66 Weight(lbs): 238 Blood Pressure 160/71 (mmHg): Body Mass Index(BMI): 29 Temperature(F): 97.8 Respiratory Rate 18 (breaths/min): Photos: [1:No Photos] [2:No Photos] [3:No Photos] Wound Location: [1:Right Malleolus - Lateral, Proximal] [2:Right Malleolus - Medial] [3:Right Malleolus - Lateral, Distal] Wounding Event: [1:Gradually Appeared] [2:Gradually Appeared] [3:Gradually Appeared] Primary Etiology: [1:Venous Leg Ulcer] [2:Venous Leg Ulcer] [3:Venous Leg Ulcer] Comorbid History: [1:Cataracts, Chronic Obstructive Pulmonary Disease (COPD), Osteoarthritis] [2:Cataracts, Chronic Obstructive Pulmonary Disease (COPD), Osteoarthritis] [3:Cataracts, Chronic Obstructive Pulmonary Disease (COPD), Osteoarthritis] Date Acquired: [1:08/11/2015] [2:01/30/2016] [3:07/17/2016] Weeks of Treatment: [1:26] [2:24] [3:0] Wound Status: [1:Open] [2:Open] [3:Open] Clustered Wound: [1:No] [2:Yes] [3:No] Measurements L x W x D 3x2.6x0.2 [  2:3.3x2.7x0.2] [3:4x3.5x0.2] (cm) Area (cm) : [1:6.126] [2:6.998] [3:10.996] Volume (cm) : [1:1.225] [2:1.4] [3:2.199] % Reduction in Area: [1:64.50%] [2:39.40%] [3:0.00%] % Reduction in Volume: 76.40% [2:-21.20%] [3:0.00%] Classification: [1:Full Thickness Without Exposed Support Structures] [2:Full Thickness Without Exposed Support Structures] [3:Partial Thickness] Exudate Amount: [1:Large] [2:Large] [3:Large] Exudate Type: [1:Serosanguineous] [2:Serosanguineous] [3:Serosanguineous] Exudate Color: [1:red, brown] [2:red, brown] [3:red, brown] Wound Margin: [1:Distinct, outline attached] [2:Distinct, outline attached] [3:Distinct, outline attached] Granulation Amount: [1:Medium (34-66%)] [2:Small (1-33%)] [3:Medium (34-66%)] Granulation Quality: [1:Red] [2:Red] [3:Pink] Necrotic Amount: [1:Medium (34-66%)] [2:Large (67-100%)] [3:Medium (34-66%)] Exposed  Structures: [name] StackBLACKWELL, [name] ([id number]) Fascia: No Fascia: No Fascia: No Fat: No Fat: No Fat: No Tendon: No Tendon: No Tendon: No Muscle: No Muscle: No Muscle: No Joint: No Joint: No Joint: No Bone: No Bone: No Bone: No Limited to Skin Limited to Skin Limited to Skin Breakdown Breakdown Breakdown Epithelialization: None None N/A Periwound Skin Texture: No Abnormalities Noted No Abnormalities Noted No Abnormalities Noted Periwound Skin Maceration: Yes Maceration: Yes Moist: Yes Moisture: Moist: Yes Moist: Yes Periwound Skin Color: No Abnormalities Noted No Abnormalities Noted No Abnormalities Noted Temperature: No Abnormality No Abnormality No Abnormality Tenderness on Yes Yes Yes Palpation: Wound Preparation: Ulcer Cleansing: Ulcer Cleansing: Ulcer Cleansing: Rinsed/Irrigated with Rinsed/Irrigated with Rinsed/Irrigated with Saline, Other: soap and Saline, Other: soap and Saline, Other: soap and water water water Topical Anesthetic Topical Anesthetic Topical Anesthetic Applied: Other: lidocaine Applied: Other: lidocaine Applied: Other: lidocaine 4% 4% 4% Treatment Notes Electronic Signature(s) Signed: 07/17/2016 4:33:45 PM By: Alejandro MullingPinkerton, Debra Entered By: Alejandro MullingPinkerton, Debra on 07/17/2016 44:01:0208:28:48 [name] StackBLACKWELL, Dimitris C. ([id number]) -------------------------------------------------------------------------------- Multi-Disciplinary Care Plan Details Patient Name: [name] StackBLACKWELL, Jagdeep C. Date of Service: 07/17/2016 8:00 AM Medical Record Number: [id number] Patient Account Number: [id number] Date of Birth/Sex: 11/18/1945 [age] y.o. Male) Treating RN: Phillis HaggisPinkerton, [name] Primary Care Physician: Other Clinician: Referring Physician: Treating Physician/Extender: Kathreen Cosieroulter, Leah Weeks in Treatment: 26 Active Inactive Venous Leg Ulcer Nursing Diagnoses: Knowledge deficit related to disease process and management Goals: Patient will maintain optimal edema control Date  Initiated: 04/03/2016 Goal Status: Active Interventions: Compression as ordered Treatment Activities: Therapeutic compression applied : 04/03/2016 Notes: Wound/Skin Impairment Nursing Diagnoses: Impaired tissue integrity Goals: Ulcer/skin breakdown will heal within 14 weeks Date Initiated: 01/17/2016 Goal Status: Active Interventions: Assess ulceration(s) every visit Notes: Electronic Signature(s) Signed: 07/17/2016 4:33:45 PM By: Alejandro MullingPinkerton, Debra Entered By: Alejandro MullingPinkerton, Debra on 07/17/2016 08:22:55 [name] StackBLACKWELL, Montavis C. ([name]) Barrington, [name] ([id number]) -------------------------------------------------------------------------------- Pain Assessment Details Patient Name: [name] StackBLACKWELL, Alban C. Date of Service: 07/17/2016 8:00 AM Medical Record Number: [id number] Patient Account Number: [id number] Date of Birth/Sex: 11/18/1945 [age] y.o. Male) Treating RN: Phillis HaggisPinkerton, [name] Primary Care Physician: Other Clinician: Referring Physician: Treating Physician/Extender: Kathreen Cosieroulter, Leah Weeks in Treatment: 26 Active Problems Location of Pain Severity and Description of Pain Patient Has Paino No Site Locations With Dressing Change: No Pain Management and Medication Current Pain Management: Electronic Signature(s) Signed: 07/17/2016 4:33:45 PM By: Alejandro MullingPinkerton, Debra Entered By: Alejandro MullingPinkerton, Debra on 07/17/2016 08:02:12 [name] StackBLACKWELL, Tiberius C. ([id number]) -------------------------------------------------------------------------------- Patient/Caregiver Education Details Patient Name: [name] StackBLACKWELL, Sargon C. Date of Service: 07/17/2016 8:00 AM Medical Record Number: [id number] Patient Account Number: [id number] Date of Birth/Gender: 11/18/1945 [age] y.o. Male) Treating RN: Phillis HaggisPinkerton, [name] Primary Care Physician: Other Clinician: Referring Physician: Treating Physician/Extender: Kathreen Cosieroulter, Leah Weeks in Treatment: 4626 Education Assessment Education Provided To: Patient Education Topics  Provided Wound/Skin Impairment: Handouts: Other: change dressing as ordered and do not get dressings wet Methods: Demonstration, Explain/Verbal Responses: State content correctly Electronic Signature(s) Signed: 07/17/2016 4:33:45 PM By: Alejandro MullingPinkerton, Debra Entered  By: Alejandro MullingPinkerton, Debra on 07/17/2016 08:28:03 [name] StackBLACKWELL, Labaron C. ([id number]) -------------------------------------------------------------------------------- Wound Assessment Details Patient Name: [name] StackBLACKWELL, Maxxwell C. Date of Service: 07/17/2016 8:00 AM Medical Record Number: [id number] Patient Account Number: [id number] Date of Birth/Sex: August 17, 1946 [age] y.o. Male) Treating RN: Phillis HaggisPinkerton, [name] Primary Care Physician: Other Clinician: Referring Physician: Treating Physician/Extender: Kathreen Cosieroulter, Leah Weeks in Treatment: 26 Wound Status Wound Number: 1 Primary Venous Leg Ulcer Etiology: Wound Location: Right Malleolus - Lateral, Proximal Wound Open Status: Wounding Event: Gradually Appeared Comorbid Cataracts, Chronic Obstructive Date Acquired: 08/11/2015 History: Pulmonary Disease (COPD), Weeks Of Treatment: 26 Osteoarthritis Clustered Wound: No Photos Photo Uploaded By: Alejandro MullingPinkerton, Debra on 07/17/2016 09:17:43 Wound Measurements Length: (cm) 6 Width: (cm) 6 Depth: (cm) 0.2 Area: (cm) 28.274 Volume: (cm) 5.655 % Reduction in Area: -63.6% % Reduction in Volume: -9.1% Epithelialization: None Tunneling: No Undermining: No Wound Description Full Thickness Without Exposed Classification: Support Structures Wound Margin: Distinct, outline attached Exudate Large Amount: Exudate Type: Serosanguineous Exudate Color: red, brown Foul Odor After Cleansing: No Wound Bed Granulation Amount: Medium (34-66%) Exposed Structure Granulation Quality: Red Fascia Exposed: No Necrotic Amount: Medium (34-66%) Fat Layer Exposed: No Necrotic Quality: Adherent Slough Tendon Exposed: No Patnaude, Amaru C.  ([id number]) Muscle Exposed: No Joint Exposed: No Bone Exposed: No Limited to Skin Breakdown Periwound Skin Texture Texture Color No Abnormalities Noted: No No Abnormalities Noted: No Moisture Temperature / Pain No Abnormalities Noted: No Temperature: No Abnormality Maceration: Yes Tenderness on Palpation: Yes Moist: Yes Wound Preparation Ulcer Cleansing: Rinsed/Irrigated with Saline, Other: soap and water, Topical Anesthetic Applied: Other: lidocaine 4%, Electronic Signature(s) Signed: 07/17/2016 4:33:45 PM By: Alejandro MullingPinkerton, Debra Entered By: Alejandro MullingPinkerton, Debra on 07/17/2016 08:34:29 [name] StackBLACKWELL, Frederico C. ([id number]) -------------------------------------------------------------------------------- Wound Assessment Details Patient Name: [name] StackBLACKWELL, Oddie C. Date of Service: 07/17/2016 8:00 AM Medical Record Number: [id number] Patient Account Number: [id number] Date of Birth/Sex: August 17, 1946 [age] y.o. Male) Treating RN: Phillis HaggisPinkerton, [name] Primary Care Physician: Other Clinician: Referring Physician: Treating Physician/Extender: Kathreen Cosieroulter, Leah Weeks in Treatment: 26 Wound Status Wound Number: 2 Primary Venous Leg Ulcer Etiology: Wound Location: Right Malleolus - Medial Wound Open Wounding Event: Gradually Appeared Status: Date Acquired: 01/30/2016 Comorbid Cataracts, Chronic Obstructive Weeks Of Treatment: 24 History: Pulmonary Disease (COPD), Clustered Wound: Yes Osteoarthritis Photos Photo Uploaded By: Alejandro MullingPinkerton, Debra on 07/17/2016 09:17:53 Wound Measurements Length: (cm) 3.3 Width: (cm) 2.7 Depth: (cm) 0.2 Area: (cm) 6.998 Volume: (cm) 1.4 % Reduction in Area: 39.4% % Reduction in Volume: -21.2% Epithelialization: None Tunneling: No Undermining: No Wound Description Full Thickness Without Exposed Classification: Support Structures Wound Margin: Distinct, outline attached Exudate Large Amount: Exudate Type: Serosanguineous Exudate Color: red, brown Foul Odor  After Cleansing: No Wound Bed Granulation Amount: Small (1-33%) Exposed Structure Granulation Quality: Red Fascia Exposed: No Necrotic Amount: Large (67-100%) Fat Layer Exposed: No Necrotic Quality: Adherent Slough Tendon Exposed: No Spoto, Levander C. ([id number]) Muscle Exposed: No Joint Exposed: No Bone Exposed: No Limited to Skin Breakdown Periwound Skin Texture Texture Color No Abnormalities Noted: No No Abnormalities Noted: No Moisture Temperature / Pain No Abnormalities Noted: No Temperature: No Abnormality Maceration: Yes Tenderness on Palpation: Yes Moist: Yes Wound Preparation Ulcer Cleansing: Rinsed/Irrigated with Saline, Other: soap and water, Topical Anesthetic Applied: Other: lidocaine 4%, Treatment Notes Wound #2 (Right, Medial Malleolus) 1. Cleansed with: Clean wound with Normal Saline Cleanse wound with antibacterial soap and water 2. Anesthetic Topical Lidocaine 4% cream to wound bed prior to debridement 3. Peri-wound Care: Barrier cream Moisturizing lotion 4. Dressing Applied: Hydrafera Blue 5. Secondary Dressing Applied ABD Pad Dry  Gauze 7. Secured with Tape 4-Layer Compression System - Right Lower Extremity Notes xtrasorb, unna to Ecologist) Signed: 07/17/2016 4:33:45 PM By: Alejandro Mulling Entered By: Alejandro Mulling on 07/17/2016 08:21:35 [name] ([id number]) -------------------------------------------------------------------------------- Vitals Details Patient Name: [name] Date of Service: 07/17/2016 8:00 AM Medical Record Number: [id number] Patient Account Number: [id number] Date of Birth/Sex: Aug 26, 1946 (70 y.o. Male) Treating RN: [name] Primary Care Physician: Other Clinician: Referring Physician: Treating Physician/Extender: Kathreen Cosier in Treatment: 26 Vital Signs Time Taken: 08:02 Temperature (F): 97.8 Height (in): 76 Pulse (bpm): 66 Weight (lbs):  238 Respiratory Rate (breaths/min): 18 Body Mass Index (BMI): 29 Blood Pressure (mmHg): 160/71 Reference Range: 80 - 120 mg / dl Electronic Signature(s) Signed: 07/17/2016 4:33:45 PM By: Alejandro Mulling Entered By: Alejandro Mulling on 07/17/2016 08:04:29

## 2016-07-24 ENCOUNTER — Encounter: Payer: Medicare HMO | Admitting: Internal Medicine

## 2016-07-24 DIAGNOSIS — I87331 Chronic venous hypertension (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: Secondary | ICD-10-CM | POA: Diagnosis not present

## 2016-07-24 NOTE — Progress Notes (Signed)
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 sufficiently improved over what I saw last week. I still see no evidence of local infection. 07/10/16; continued gradual improvement in the right medial and lateral malleolus locations. The lateral is more substantial wound now divided into 2 by a rim of normal epithelialization. 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 He denies any complaints of pain or intolerance to compression. Both ulcers are smaller in size oriented today's measurements, both are covered with a softly adherent slough. [name], [name] ([id number]) Objective Constitutional hypertensive. afebrile. well nourished; well developed; appears stated age;Marland Kitchen Vitals Time Taken: 8:02 AM, Height: 76 in, Weight: 238 lbs, BMI: 29, Temperature: 97.8 F, Pulse: 66 bpm, Respiratory Rate: 18 breaths/min, Blood Pressure: 160/71 mmHg. Respiratory non-labored respiratory effort. Cardiovascular palpable DP, non-palpable PT. extensive scar tissue to right foot from distant history of injury and skin grafting; warm extremities, well perfused, cap refill less than 3 seconds, no edema  present. Musculoskeletal ambulated without assistance; steady gait. Psychiatric oriented to time, place, person and situation. calm, pleasant, conversive. General Notes: Mr. Maroney is tolerating the Profore compression Integumentary (Hair, Skin) no periwound erythema, softly adherent slough to both ulcerations left ulcer with more densely adherent slough, no evidence of deeper structure; granular buds noted to the lateral malleolar ulceration none noted to the medial malleolar ulceration. no induration, no fluctuance, denies pain, significant scar tissue to entirety of dorsum of right foot. Wound #1 status is Open. Original cause of wound was Gradually Appeared. The wound is located on the Right,Lateral Malleolus. The wound measures 6cm length x 6cm width x 0.2cm depth; 28.274cm^2 area and 5.655cm^3 volume. The wound is limited to skin breakdown. There is no tunneling or undermining noted. There is a large amount of serosanguineous drainage noted. The wound margin is distinct with the outline attached to the wound base. There is medium (34-66%) red granulation within the wound bed. There is a medium (34-66%) amount of necrotic tissue within the wound bed including Adherent Slough. The periwound skin appearance exhibited: Maceration, Moist. Periwound temperature was noted as No Abnormality. The periwound has tenderness on palpation. Wound #2 status is Open. Original cause of wound was Gradually Appeared. The wound is located on the Right,Medial Malleolus. The wound measures 3.3cm length x 2.7cm width x 0.2cm depth; 6.998cm^2 area and 1.4cm^3 volume. The wound is limited to skin breakdown. There is no tunneling or undermining noted. There is a large amount of serosanguineous drainage noted. The wound margin is distinct with the outline attached to the wound base. There is small (1-33%) red granulation within the wound bed. There is a large (67-100%) amount of necrotic tissue within the wound  bed including Adherent Slough. The periwound skin appearance exhibited: Maceration, Moist. Periwound temperature was noted as No Abnormality. The [name], [name] periwound has tenderness on palpation. 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 There was an Open Wound debridement with total area of 36 sq cm performed by Bonnell Public, NP. with the following instrument(s): Curette to remove Viable and Non-Viable tissue/material including Exudate, Fibrin/Slough, and Subcutaneous after achieving pain control using Lidocaine 4% Topical Solution. A time out was conducted at 08:29, prior to the start of the procedure. A Minimum amount of bleeding was controlled with Pressure. The procedure was tolerated well with a pain level of 0 throughout and a pain level of 0 following the procedure. Post Debridement Measurements: 6cm length x 6cm width x 0.2cm depth; 5.655cm^3 volume. Character of Wound/Ulcer Post Debridement requires further debridement. Severity of Tissue Post Debridement is: Fat layer exposed. Post procedure Diagnosis Wound #1: Same as Pre-Procedure Wound #2 Wound #2 is a Venous Leg Ulcer located on the Right,Medial Malleolus . There was a Non-Viable Tissue Open Wound/Selective [phone]) debridement with total area of 8.91 sq cm performed by Bonnell Public, NP. with the following instrument(s): Curette to remove Viable and Non-Viable tissue/material including Exudate, Fibrin/Slough, and Subcutaneous after achieving pain control using  Lidocaine 4% Topical Solution. A time out was conducted at 08:29, prior to the start of the procedure. A Minimum amount of bleeding was controlled with Pressure. The procedure was tolerated well with a pain level of 0 throughout and a pain level of 0 following the procedure. Post Debridement Measurements: 3.3cm length x 2.7cm width x 0.2cm depth; 1.4cm^3 volume. Character of Wound/Ulcer Post Debridement requires further debridement. Severity of Tissue Post Debridement is: Fat layer exposed. Post procedure Diagnosis Wound #2: Same as Pre-Procedure [name], DEVOL. ([id number]) Plan Wound Cleansing: Wound #1 Right,Lateral Malleolus: Cleanse wound with mild soap and water May shower with protection. No tub bath. Wound #2 Right,Medial Malleolus: Cleanse wound with mild soap and water May shower with protection. No tub bath. Skin Barriers/Peri-Wound Care: Wound #1 Right,Lateral Malleolus: Barrier cream - zinc oxide Moisturizing lotion Wound #2 Right,Medial Malleolus: Barrier cream - zinc oxide Moisturizing lotion Primary Wound Dressing: Wound #1 Right,Lateral Malleolus: Hydrafera Blue Wound #2 Right,Medial Malleolus: Hydrafera Blue Secondary Dressing: Wound #1 Right,Lateral Malleolus: ABD pad Dry Gauze - charcoal XtraSorb Wound #2 Right,Medial Malleolus: ABD pad Dry Gauze - charcoal XtraSorb Follow-up Appointments: Wound #1 Right,Lateral Malleolus: Return Appointment in 1 week. Edema Control: Wound #1 Right,Lateral Malleolus: 4-Layer Compression System - Right Lower Extremity - UNNA TO ANCHOR Wound #2 Right,Medial Malleolus: 4-Layer Compression System - Right Lower Extremity - UNNA TO ANCHOR Additional Orders / Instructions: Wound #1 Right,Lateral Malleolus: Increase protein intake. OK to return to work with the following restrictions: Activity as tolerated Wound #2 Right,Medial Malleolus: Increase protein intake. [name], [name] ([id number]) OK to return to  work with the following restrictions: Activity as tolerated Follow-Up Appointments: A follow-up appointment should be scheduled. Medication Reconciliation completed and provided to Patient/Care Provider. 1. Continue with Hydrofera Blue and Profore compression 2. Continue with weekly follow-ups Electronic Signature(s) Signed: 07/23/2016 9:17:57 AM By: Penne Lash, NP, Deryck Hippler Previous Signature: 07/17/2016 8:47:53 AM Version By: Penne Lash, NP, Geoffrey Hynes Entered By: Penne Lash, NP, Emilyanne Mcgough on 07/23/2016 09:17:57 [name] ([id number]) -------------------------------------------------------------------------------- SuperBill Details Patient Name: [name] Date of Service: 07/17/2016 Medical Record Number: [id number] Patient Account Number: [id number] Date of Birth/Sex: [birth year] (70 y.o. 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 Facility Procedures CPT4: Description Modifier Quantity Code [id number] - DEBRIDE WOUND 1ST 20 SQ CM OR < 1 ICD-10 Description Diagnosis I87.331 Chronic venous hypertension (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 T85.613A Breakdown (mechanical)  of artificial skin graft and decellularized allodermis, initial encounter CPT4: [id number] - DEBRIDE WOUND EA ADDL 20 SQ CM 2 ICD-10 Description Diagnosis I87.331 Chronic venous hypertension (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 T85.613A  Breakdown (mechanical) of artificial skin graft and  decellularized allodermis, initial encounter Physician Procedures CPT4: Description Modifier Quantity Code [id number] 97597 - WC PHYS DEBR WO ANESTH 20 SQ CM 1 ICD-10 Description Diagnosis I87.331 [name], Christoffer C. ([id number]) Electronic Signature(s) Signed: 07/17/2016 8:48:33 AM By: Penne Lashoulter, NP, Shalanda Brogden Entered By: Penne Lashoulter, NP, Cadey Bazile on 07/17/2016 08:48:33

## 2016-07-25 NOTE — Progress Notes (Signed)
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 local infection. 07/10/16; continued gradual improvement in the right medial and lateral malleolus locations. The lateral is more substantial wound now divided into 2 by a rim of normal epithelialization. 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 He denies any complaints of pain or intolerance to compression. Both ulcers are smaller in size oriented today's measurements, both are covered with a softly adherent slough. [name], [name] ([id number]) 07/24/16; medial wound is smaller, lateral about the same although surface looks better. Still using Hydrofera Blue Objective Constitutional Patient is hypertensive.. Pulse regular and within target range for patient.Marland Kitchen Respirations regular, non-labored and within target range.. Temperature is normal and within the target range for the patient.. Patient's appearance is neat and clean. Appears in no acute distress. Well nourished and well developed.. Vitals Time Taken: 8:00 AM, Height: 76 in, Weight: 238 lbs, BMI: 29, Temperature: 97.9 F, Pulse: 75 bpm, Respiratory Rate: 18 breaths/min, Blood Pressure: 160/75 mmHg. General Notes: Wound exam; both wound beds in the same state.  Covered in adherent slough, some nonviable tissue. Removed with a curet hemostasis direct pressure. No evidence of surrounding infection Integumentary (Hair, Skin) Wound #1 status is Open. Original cause of wound was Gradually Appeared. The wound is located on the Right,Lateral Malleolus. The wound measures 6.5cm length x 6cm width x 0.2cm depth; 30.631cm^2 area and 6.126cm^3 volume. The wound is limited to skin breakdown. There is no tunneling or undermining noted. There is a large amount of serosanguineous drainage noted. The wound margin is distinct with the outline attached to the wound base. There is medium (34-66%) red granulation within the wound bed. There is a medium (34-66%) amount of necrotic tissue within the wound bed including Adherent Slough. The periwound skin appearance exhibited: Maceration, Moist. Periwound temperature was noted as No Abnormality. The periwound has tenderness on palpation. Wound #2 status is Open. Original cause of wound was Gradually Appeared. The wound is located on the Right,Medial Malleolus. The wound measures 3.2cm length x 2.3cm width x 0.2cm depth; 5.781cm^2 area and 1.156cm^3 volume. The wound is limited to skin breakdown. There is no tunneling or undermining noted. There is a large amount of serosanguineous drainage noted. The wound margin is distinct with the outline attached to the wound base. There is medium (34-66%) red granulation within the wound bed. There is a medium (34-66%) amount of necrotic tissue within the wound bed including Adherent Slough. The periwound skin appearance exhibited: Maceration, Moist. Periwound temperature was noted as No Abnormality. The periwound has tenderness on palpation. 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 There was a Skin/Subcutaneous Tissue Debridement ([id number]) debridement with total area of 39 sq cm performed by [name] Caul, MD. with the following instrument(s): scoop to remove Viable and Non-Viable tissue/material including Exudate, Fibrin/Slough, and Subcutaneous after achieving pain control  using Lidocaine 4% Topical Solution. A time out was conducted at 08:25, prior to the start of the procedure. A Minimum amount of bleeding was controlled with Pressure. The procedure was tolerated well with a pain level of 0 throughout and a pain level of 0 following the procedure. Post Debridement Measurements: 6.5cm length x 6cm width x 0.2cm depth; 6.126cm^3 volume. Character of Wound/Ulcer Post Debridement requires further debridement. Severity of Tissue Post Debridement is: Fat layer exposed. Post procedure Diagnosis Wound #1: Same as Pre-Procedure Wound #2 Wound #2 is a Venous Leg Ulcer located on the Right,Medial Malleolus . There was a Skin/Subcutaneous Tissue Debridement ([id number]) debridement with total area of 7.36 sq cm performed by [name] Caul, MD. with the following instrument(s): scoop to remove Viable and Non-Viable tissue/material including Exudate, Fibrin/Slough, and Subcutaneous after achieving pain control using Lidocaine 4% Topical Solution. A time out was conducted at 08:25, prior to the start of the procedure. A Minimum amount of bleeding was controlled with Pressure. The procedure was tolerated well with a pain level of 0 throughout and a pain level of 0 following the procedure. Post Debridement Measurements: 3.2cm length x 2.3cm width x 0.2cm depth; 1.156cm^3 volume. Character of  Wound/Ulcer Post Debridement requires further debridement. Severity of Tissue Post Debridement is: Fat layer exposed. Post procedure Diagnosis Wound #2: Same as Pre-Procedure Plan Wound Cleansing: Wound #1 Right,Lateral Malleolus: Cleanse wound with mild soap and water May shower with protection. [name], [name] ([id number]) No tub bath. Wound #2 Right,Medial Malleolus: Cleanse wound with mild soap and water May shower with protection. No tub bath. Skin Barriers/Peri-Wound Care: Wound #1 Right,Lateral Malleolus: Barrier cream - zinc oxide Moisturizing lotion Wound #2 Right,Medial Malleolus: Barrier cream - zinc oxide Moisturizing lotion Primary Wound Dressing: Wound #1 Right,Lateral Malleolus: Hydrafera Blue Wound #2 Right,Medial Malleolus: Hydrafera Blue Secondary Dressing: Wound #1 Right,Lateral Malleolus: ABD pad Dry Gauze - charcoal XtraSorb Wound #2 Right,Medial Malleolus: ABD pad Dry Gauze - charcoal XtraSorb Follow-up Appointments: Wound #1 Right,Lateral Malleolus: Return Appointment in 1 week. Edema Control: Wound #1 Right,Lateral Malleolus: 4-Layer Compression System - Right Lower Extremity - UNNA TO ANCHOR Wound #2 Right,Medial Malleolus: 4-Layer Compression System - Right Lower Extremity - UNNA TO ANCHOR Additional Orders / Instructions: Wound #1 Right,Lateral Malleolus: Increase protein intake. OK to return to work with the following restrictions: Activity as tolerated Wound #2 Right,Medial Malleolus: Increase protein intake. OK to return to work with the following restrictions: Activity as tolerated Farrior, Hawkins C. ([id number]) #1 I'm going to continue with the [facility] for this week. He still requires weekly debridement. Consider Iodoflex Electronic Signature(s) Signed: 07/24/2016 6:11:07 PM By: [name] Najjar MD Entered By: [name] on 07/24/2016 08:35:03 [name]  ([id number]) -------------------------------------------------------------------------------- SuperBill Details [name] Date of Service: 07/24/2016 Patient Name: C. Patient Account Number: [id number] Medical Record Treating RN: [name] [id number] Number: Other Clinician: Date of Birth/Sex: 1946/03/14 (70 y.o. 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, initial T85.613A encounter Facility Procedures CPT4: Description Modifier Quantity Code [id number] 11042 - DEB SUBQ TISSUE 20 SQ CM/< 1 ICD-10 Description Diagnosis I87.331 Chronic venous hypertension (idiopathic) with ulcer and inflammation of right lower  extremity L97.212 Non-pressure chronic ulcer  of right calf with fat layer exposed CPT4: [id number] 11045 - DEB SUBQ TISS EA ADDL 20CM 2 ICD-10 Description Diagnosis I87.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 ([id number]) Electronic Signature(s) Signed: 07/24/2016 6:11:07 PM By: [name] Najjarobson, Anaissa Macfadden MD Entered By: [name] Najjarobson, Yao [place] on 07/24/2016 08:35:43

## 2016-07-25 NOTE — Progress Notes (Signed)
[name], [name] ([id number]) Visit Report for 07/24/2016 Arrival Information Details [name], [name] Date of Service: 07/24/2016 8:00 AM Patient Name: C. Patient Account Number: [id number] Medical Record Treating RN: [name] [id number] Number: Other Clinician: Date of Birth/Sex: September 12, 1945 (70 y.o. Male) Treating [name], [name] Primary Care Physician: Physician/Extender: G Referring Physician: Tania Ade in Treatment: 30 Visit Information History Since Last Visit All ordered tests and consults were completed: No Patient Arrived: Ambulatory Added or deleted any medications: No Arrival Time: 07:59 Any new allergies or adverse reactions: No Accompanied By: self Had a fall or experienced change in No Transfer Assistance: None activities of daily living that may affect Patient Identification Verified: Yes risk of falls: Secondary Verification Process Yes Signs or symptoms of abuse/neglect since last No Completed: visito Patient Requires Transmission-Based No Hospitalized since last visit: No Precautions: Pain Present Now: No Patient Has Alerts: No Electronic Signature(s) Signed: 07/24/2016 5:55:17 PM By: Alejandro Mulling Entered By: Alejandro Mulling on 07/24/2016 08:00:44 [name] ([id number]) -------------------------------------------------------------------------------- Encounter Discharge Information Details [name] Date of Service: 07/24/2016 8:00 AM Patient Name: C. Patient Account Number: [id number] Medical Record Treating RN: [name] [id number] Number: Other Clinician: Date of Birth/Sex: [birth year] (71 y.o. Male) Treating [name], [name] Primary Care Physician: Physician/Extender: G Referring Physician: Tania Ade in Treatment: 57 Encounter Discharge Information Items Discharge Pain Level: 0 Discharge Condition: Stable Ambulatory Status: Ambulatory Discharge Destination: Home Transportation: Private Auto Accompanied By:  self Schedule Follow-up Appointment: Yes Medication Reconciliation completed Yes and provided to Patient/Care Kaydance Bowie: Patient Clinical Summary of Care: Declined Electronic Signature(s) Signed: 07/24/2016 8:57:34 AM By: Gwenlyn Perking Entered By: Gwenlyn Perking on 07/24/2016 08:57:34 [name] ([id number]) -------------------------------------------------------------------------------- Lower Extremity Assessment Details [name] Date of Service: 07/24/2016 8:00 AM Patient Name: C. Patient Account Number: [id number] Medical Record Treating RN: [name] [id number] Number: Other Clinician: Date of Birth/Sex: Oct 22, 1945 (70 y.o. Male) Treating [name], [name] Primary Care Physician: Physician/Extender: G Referring Physician: Tania Ade in Treatment: 27 Edema Assessment Assessed: [Left: No] [Right: No] E[Left: dema] [Right: :] Calf Left: Right: Point of Measurement: 40 cm From Medial Instep cm 33.8 cm Ankle Left: Right: Point of Measurement: 12 cm From Medial Instep cm 23.2 cm Vascular Assessment Pulses: Posterior Tibial Dorsalis Pedis Palpable: [Right:Yes] Extremity colors, hair growth, and conditions: Extremity Color: [Right:Hyperpigmented] Temperature of Extremity: [Right:Warm] Capillary Refill: [Right:< 3 seconds] Toe Nail Assessment Left: Right: Thick: Yes Discolored: Yes Deformed: Yes Improper Length and Hygiene: No Electronic Signature(s) Signed: 07/24/2016 5:55:17 PM By: Alejandro Mulling Entered By: Alejandro Mulling on 07/24/2016 08:13:06 [name] ([id number]) [name], [name] ([id number]) -------------------------------------------------------------------------------- Multi Wound Chart Details [name] Date of Service: 07/24/2016 8:00 AM Patient Name: C. Patient Account Number: [id number] Medical Record Treating RN: [name] [id number] Number: Other Clinician: Date of Birth/Sex: 12-17-1945 (69 y.o. Male)  Treating [name], [name] Primary Care Physician: Physician/Extender: G Referring Physician: Tania Ade in Treatment: 27 Vital Signs Height(in): 76 Pulse(bpm): 75 Weight(lbs): 238 Blood Pressure 160/75 (mmHg): Body Mass Index(BMI): 29 Temperature(F): 97.9 Respiratory Rate 18 (breaths/min): Photos: [N/A:N/A] Wound Location: Right Malleolus - Lateral Right Malleolus - Medial N/A Wounding Event: Gradually Appeared Gradually Appeared N/A Primary Etiology: Venous Leg Ulcer Venous Leg Ulcer N/A Comorbid History: Cataracts, Chronic Cataracts, Chronic N/A Obstructive Pulmonary Obstructive Pulmonary Disease (COPD), Disease (COPD), Osteoarthritis Osteoarthritis Date Acquired: 08/11/2015 01/30/2016 N/A Weeks of Treatment: 27 25 N/A Wound Status: Open Open N/A Clustered Wound: No Yes N/A Measurements L x W x D 6x6x0.2 3.2x2.3x0.2 N/A (cm) Area (cm) : 28.274 5.781 N/A Volume (cm) :  5.655 1.156 N/A % Reduction in Area: -63.60% 49.90% N/A % Reduction in Volume: -9.10% -0.10% N/A Classification: Full Thickness Without Full Thickness Without N/A Exposed Support Exposed Support Structures Structures Exudate Amount: Large Large N/A [name] StackBLACKWELL, [name] ([id number]) Exudate Type: Serosanguineous Serosanguineous N/A Exudate Color: red, brown red, brown N/A Wound Margin: Distinct, outline attached Distinct, outline attached N/A Granulation Amount: Medium (34-66%) Medium (34-66%) N/A Granulation Quality: Red Red N/A Necrotic Amount: Medium (34-66%) Medium (34-66%) N/A Exposed Structures: Fascia: No Fascia: No N/A Fat: No Fat: No Tendon: No Tendon: No Muscle: No Muscle: No Joint: No Joint: No Bone: No Bone: No Limited to Skin Limited to Skin Breakdown Breakdown Epithelialization: None None N/A Periwound Skin Texture: No Abnormalities Noted No Abnormalities Noted N/A Periwound Skin Maceration: Yes Maceration: Yes N/A Moisture: Moist: Yes Moist: Yes Periwound Skin Color: No  Abnormalities Noted No Abnormalities Noted N/A Temperature: No Abnormality No Abnormality N/A Tenderness on Yes Yes N/A Palpation: Wound Preparation: Ulcer Cleansing: Ulcer Cleansing: N/A Rinsed/Irrigated with Rinsed/Irrigated with Saline, Other: soap and Saline, Other: soap and water water Topical Anesthetic Topical Anesthetic Applied: Other: lidocaine Applied: Other: lidocaine 4% 4% Treatment Notes Electronic Signature(s) Signed: 07/24/2016 5:55:17 PM By: Alejandro MullingPinkerton, Debra Entered By: Alejandro MullingPinkerton, Debra on 07/24/2016 08:23:46 [name] StackBLACKWELL, [name] ([id number]) -------------------------------------------------------------------------------- Multi-Disciplinary Care Plan Details [name] RingerBLACKWELL, [name] Date of Service: 07/24/2016 8:00 AM Patient Name: C. Patient Account Number: [id number] Medical Record Treating RN: Phillis Haggisinkerton, [name] [id number] Number: Other Clinician: Date of Birth/Sex: October 14, 1945 [age] y.o. Male) Treating Baltazar NajjarOBSON, [name] Primary Care Physician: Physician/Extender: G Referring Physician: Tania AdeWeeks in Treatment: 27 Active Inactive Venous Leg Ulcer Nursing Diagnoses: Knowledge deficit related to disease process and management Goals: Patient will maintain optimal edema control Date Initiated: 04/03/2016 Goal Status: Active Interventions: Compression as ordered Treatment Activities: Therapeutic compression applied : 04/03/2016 Notes: Wound/Skin Impairment Nursing Diagnoses: Impaired tissue integrity Goals: Ulcer/skin breakdown will heal within 14 weeks Date Initiated: 01/17/2016 Goal Status: Active Interventions: Assess ulceration(s) every visit Notes: Electronic Signature(s) Signed: 07/24/2016 5:55:17 PM By: Aurelio JewPinkerton, Debra [name], [name] ([id number]) Entered By: Alejandro MullingPinkerton, Debra on 07/24/2016 08:23:26 [name] StackBLACKWELL, Danzig C. ([id number]) -------------------------------------------------------------------------------- Pain Assessment Details [name] RingerBLACKWELL, [name] Date  of Service: 07/24/2016 8:00 AM Patient Name: C. Patient Account Number: [id number] Medical Record Treating RN: Phillis Haggisinkerton, [name] [id number] Number: Other Clinician: Date of Birth/Sex: October 14, 1945 [age] y.o. Male) Treating Baltazar NajjarOBSON, [name] Primary Care Physician: Physician/Extender: G Referring Physician: Tania AdeWeeks in Treatment: 27 Active Problems Location of Pain Severity and Description of Pain Patient Has Paino No Site Locations With Dressing Change: No Pain Management and Medication Current Pain Management: Electronic Signature(s) Signed: 07/24/2016 5:55:17 PM By: Alejandro MullingPinkerton, Debra Entered By: Alejandro MullingPinkerton, Debra on 07/24/2016 08:00:49 [name] StackBLACKWELL, Dhairya C. ([id number]) -------------------------------------------------------------------------------- Patient/Caregiver Education Details [name] RingerBLACKWELL, [name] Date of Service: 07/24/2016 8:00 AM Patient Name: C. Patient Account Number: [id number] Medical Record Treating RN: Phillis Haggisinkerton, [name] [id number] Number: Other Clinician: Date of Birth/Gender: October 14, 1945 [age] y.o. Male) Treating Baltazar NajjarOBSON, [name] Primary Care Physician: Physician/Extender: G Referring Physician: Tania AdeWeeks in Treatment: 3227 Education Assessment Education Provided To: Patient Education Topics Provided Wound/Skin Impairment: Handouts: Other: change dressing as ordered and do not get dressing wet Methods: Demonstration, Explain/Verbal Responses: State content correctly Electronic Signature(s) Signed: 07/24/2016 5:55:17 PM By: Alejandro MullingPinkerton, Debra Entered By: Alejandro MullingPinkerton, Debra on 07/24/2016 08:19:53 [name] StackBLACKWELL, [name] ([id number]) -------------------------------------------------------------------------------- Wound Assessment Details [name] RingerBLACKWELL, [name] Date of Service: 07/24/2016 8:00 AM Patient Name: C. Patient Account Number: [id number] Medical Record Treating RN: Phillis Haggisinkerton, [name] [id number] Number: Other Clinician: Date of Birth/Sex: October 14, 1945 [age] y.o. Male) Treating [name],  [name] Primary  Care Physician: Physician/Extender: G Referring Physician: Tania Ade in Treatment: 27 Wound Status Wound Number: 1 Primary Venous Leg Ulcer Etiology: Wound Location: Right, Lateral Malleolus Wound Open Wounding Event: Gradually Appeared Status: Date Acquired: 08/11/2015 Comorbid Cataracts, Chronic Obstructive Weeks Of Treatment: 27 History: Pulmonary Disease (COPD), Clustered Wound: No Osteoarthritis Photos Photo Uploaded By: Alejandro Mulling on 07/24/2016 08:20:50 Wound Measurements Length: (cm) 6.5 Width: (cm) 6 Depth: (cm) 0.2 Area: (cm) 30.631 Volume: (cm) 6.126 % Reduction in Area: -77.3% % Reduction in Volume: -18.2% Epithelialization: None Tunneling: No Undermining: No Wound Description Full Thickness Without Exposed Classification: Support Structures Wound Margin: Distinct, outline attached Exudate Large Amount: Exudate Type: Serosanguineous Exudate Color: red, brown Foul Odor After Cleansing: No Wound Bed Code, [name] Granulation Amount: Medium (34-66%) Exposed Structure Granulation Quality: Red Fascia Exposed: No Necrotic Amount: Medium (34-66%) Fat Layer Exposed: No Necrotic Quality: Adherent Slough Tendon Exposed: No Muscle Exposed: No Joint Exposed: No Bone Exposed: No Limited to Skin Breakdown Periwound Skin Texture Texture Color No Abnormalities Noted: No No Abnormalities Noted: No Moisture Temperature / Pain No Abnormalities Noted: No Temperature: No Abnormality Maceration: Yes Tenderness on Palpation: Yes Moist: Yes Wound Preparation Ulcer Cleansing: Rinsed/Irrigated with Saline, Other: soap and water, Topical Anesthetic Applied: Other: lidocaine 4%, Treatment Notes Wound #1 (Right, Lateral Malleolus) 1. Cleansed with: Clean wound with Normal Saline Cleanse wound with antibacterial soap and water 2. Anesthetic Topical Lidocaine 4% cream to wound bed prior to debridement 3. Peri-wound  Care: Barrier cream Moisturizing lotion 4. Dressing Applied: Hydrafera Blue 5. Secondary Dressing Applied ABD Pad Dry Gauze 7. Secured with 4-Layer Compression System - Right Lower Extremity Notes xtrasorb Electronic Signature(s) Signed: 07/24/2016 5:55:17 PM By: Alejandro Mulling Entered By: Alejandro Mulling on 07/24/2016 08:28:07 [name] ([id number]) -------------------------------------------------------------------------------- Wound Assessment Details [name] Date of Service: 07/24/2016 8:00 AM Patient Name: C. Patient Account Number: [id number] Medical Record Treating RN: [name] [id number] Number: Other Clinician: Date of Birth/Sex: 1945-12-22 (70 y.o. Male) Treating [name], [name] Primary Care Physician: Physician/Extender: G Referring Physician: Tania Ade in Treatment: 27 Wound Status Wound Number: 2 Primary Venous Leg Ulcer Etiology: Wound Location: Right Malleolus - Medial Wound Open Wounding Event: Gradually Appeared Status: Date Acquired: 01/30/2016 Comorbid Cataracts, Chronic Obstructive Weeks Of Treatment: 25 History: Pulmonary Disease (COPD), Clustered Wound: Yes Osteoarthritis Photos Photo Uploaded By: Alejandro Mulling on 07/24/2016 08:20:51 Wound Measurements Length: (cm) 3.2 Width: (cm) 2.3 Depth: (cm) 0.2 Area: (cm) 5.781 Volume: (cm) 1.156 % Reduction in Area: 49.9% % Reduction in Volume: -0.1% Epithelialization: None Tunneling: No Undermining: No Wound Description Full Thickness Without Exposed Classification: Support Structures Wound Margin: Distinct, outline attached Exudate Large Amount: Exudate Type: Serosanguineous Exudate Color: red, brown Foul Odor After Cleansing: No Wound Bed [name], [name] ([id number]) Granulation Amount: Medium (34-66%) Exposed Structure Granulation Quality: Red Fascia Exposed: No Necrotic Amount: Medium (34-66%) Fat Layer Exposed: No Necrotic Quality: Adherent  Slough Tendon Exposed: No Muscle Exposed: No Joint Exposed: No Bone Exposed: No Limited to Skin Breakdown Periwound Skin Texture Texture Color No Abnormalities Noted: No No Abnormalities Noted: No Moisture Temperature / Pain No Abnormalities Noted: No Temperature: No Abnormality Maceration: Yes Tenderness on Palpation: Yes Moist: Yes Wound Preparation Ulcer Cleansing: Rinsed/Irrigated with Saline, Other: soap and water, Topical Anesthetic Applied: Other: lidocaine 4%, Treatment Notes Wound #2 (Right, Medial Malleolus) 1. Cleansed with: Clean wound with Normal Saline Cleanse wound with antibacterial soap and water 2. Anesthetic Topical Lidocaine 4% cream to wound bed prior to debridement 3. Peri-wound Care:  Barrier cream Moisturizing lotion 4. Dressing Applied: Hydrafera Blue 5. Secondary Dressing Applied ABD Pad Dry Gauze 7. Secured with 4-Layer Compression System - Right Lower Extremity Notes xtrasorb Electronic Signature(s) Signed: 07/24/2016 5:55:17 PM By: Alejandro MullingPinkerton, Debra Entered By: Alejandro MullingPinkerton, Debra on 07/24/2016 08:16:01 [name] StackBLACKWELL, Rithwik C. ([id number]) -------------------------------------------------------------------------------- Vitals Details [name] RingerBLACKWELL, Zakry Date of Service: 07/24/2016 8:00 AM Patient Name: C. Patient Account Number: [id number] Medical Record Treating RN: Phillis Haggisinkerton, [name] [id number] Number: Other Clinician: Date of Birth/Sex: 1946/07/20 16(70 y.o. Male) Treating [name], [name] Primary Care Physician: Physician/Extender: G Referring Physician: Tania AdeWeeks in Treatment: 5927 Vital Signs Time Taken: 08:00 Temperature (F): 97.9 Height (in): 76 Pulse (bpm): 75 Weight (lbs): 238 Respiratory Rate (breaths/min): 18 Body Mass Index (BMI): 29 Blood Pressure (mmHg): 160/75 Reference Range: 80 - 120 mg / dl Electronic Signature(s) Signed: 07/24/2016 5:55:17 PM By: Alejandro MullingPinkerton, Debra Entered By: Alejandro MullingPinkerton, Debra on 07/24/2016 08:03:40

## 2016-07-31 ENCOUNTER — Encounter: Payer: Medicare HMO | Attending: Internal Medicine | Admitting: Internal Medicine

## 2016-07-31 DIAGNOSIS — Z86718 Personal history of other venous thrombosis and embolism: Secondary | ICD-10-CM | POA: Insufficient documentation

## 2016-07-31 DIAGNOSIS — L97212 Non-pressure chronic ulcer of right calf with fat layer exposed: Secondary | ICD-10-CM | POA: Diagnosis not present

## 2016-07-31 DIAGNOSIS — Y839 Surgical procedure, unspecified as the cause of abnormal reaction of the patient, or of later complication, without mention of misadventure at the time of the procedure: Secondary | ICD-10-CM | POA: Diagnosis not present

## 2016-07-31 DIAGNOSIS — Z7901 Long term (current) use of anticoagulants: Secondary | ICD-10-CM | POA: Diagnosis not present

## 2016-07-31 DIAGNOSIS — T85613A 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, initial encounter: Secondary | ICD-10-CM | POA: Insufficient documentation

## 2016-07-31 DIAGNOSIS — J449 Chronic obstructive pulmonary disease, unspecified: Secondary | ICD-10-CM | POA: Insufficient documentation

## 2016-07-31 DIAGNOSIS — I87331 Chronic venous hypertension (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: Secondary | ICD-10-CM | POA: Diagnosis not present

## 2016-08-01 NOTE — Progress Notes (Signed)
[name] ([id number]) Visit Report for 07/31/2016 Arrival Information Details [name] Date of Service: 07/31/2016 8:00 AM Patient Name: C. Patient Account Number: [id number] Medical Record Treating RN: [name] [id number] Number: Other Clinician: Date of Birth/Sex: 08-20-1946 (70 y.o. Male) Treating [name] Primary Care Physician: Physician/Extender: G Referring Physician: Tania Ade in Treatment: 28 Visit Information History Since Last Visit All ordered tests and consults were completed: No Patient Arrived: Ambulatory Added or deleted any medications: No Arrival Time: 08:03 Any new allergies or adverse reactions: No Accompanied By: self Had a fall or experienced change in No Transfer Assistance: None activities of daily living that may affect Patient Identification Verified: Yes risk of falls: Secondary Verification Process Yes Signs or symptoms of abuse/neglect since last No Completed: visito Patient Requires Transmission-Based No Hospitalized since last visit: No Precautions: Pain Present Now: Yes Patient Has Alerts: No Electronic Signature(s) Signed: 07/31/2016 4:57:07 PM By: Alejandro Mulling Entered By: Alejandro Mulling on 07/31/2016 08:04:06 [name] ([id number]) -------------------------------------------------------------------------------- Encounter Discharge Information Details [name] Date of Service: 07/31/2016 8:00 AM Patient Name: C. Patient Account Number: [id number] Medical Record Treating RN: [name] [id number] Number: Other Clinician: Date of Birth/Sex: 18-Feb-1946 (70 y.o. Male) Treating [name] Primary Care Physician: Physician/Extender: G Referring Physician: Tania Ade in Treatment: 47 Encounter Discharge Information Items Discharge Pain Level: 0 Discharge Condition: Stable Ambulatory Status: Ambulatory Discharge Destination: Home Transportation: Private Auto Accompanied By:  self Schedule Follow-up Appointment: Yes Medication Reconciliation completed Yes and provided to Patient/Care Marlenne Ridge: Patient Clinical Summary of Care: Declined Electronic Signature(s) Signed: 07/31/2016 4:57:07 PM By: Alejandro Mulling Previous Signature: 07/31/2016 8:55:26 AM Version By: Gwenlyn Perking Entered By: Alejandro Mulling on 07/31/2016 09:38:13 [name] ([id number]) -------------------------------------------------------------------------------- Lower Extremity Assessment Details [name] Date of Service: 07/31/2016 8:00 AM Patient Name: C. Patient Account Number: [id number] Medical Record Treating RN: [name] [id number] Number: Other Clinician: Date of Birth/Sex: 24-Aug-1946 (70 y.o. Male) Treating [name] Primary Care Physician: Physician/Extender: G Referring Physician: Tania Ade in Treatment: 28 Edema Assessment Assessed: [Left: No] [Right: No] E[Left: dema] [Right: :] Calf Left: Right: Point of Measurement: 40 cm From Medial Instep cm 34.2 cm Ankle Left: Right: Point of Measurement: 12 cm From Medial Instep cm 23.6 cm Vascular Assessment Pulses: Posterior Tibial Dorsalis Pedis Palpable: [Right:Yes] Extremity colors, hair growth, and conditions: Extremity Color: [Right:Hyperpigmented] Temperature of Extremity: [Right:Warm] Capillary Refill: [Right:< 3 seconds] Toe Nail Assessment Left: Right: Thick: Yes Discolored: Yes Deformed: Yes Improper Length and Hygiene: No Electronic Signature(s) Signed: 07/31/2016 4:57:07 PM By: Alejandro Mulling Entered By: Alejandro Mulling on 07/31/2016 08:11:43 [name] ([id number]) [name], [name] ([id number]) -------------------------------------------------------------------------------- Multi Wound Chart Details [name] Date of Service: 07/31/2016 8:00 AM Patient Name: C. Patient Account Number: [id number] Medical Record Treating RN: [name] [id number] Number: Other Clinician: Date of Birth/Sex: August 01, 1946 (70 y.o. Male) Treating [name] Primary Care Physician: Physician/Extender: G Referring Physician: Tania Ade in Treatment: 28 Vital Signs Height(in): 76 Pulse(bpm): 68 Weight(lbs): 238 Blood Pressure 152/72 (mmHg): Body Mass Index(BMI): 29 Temperature(F): 97.4 Respiratory Rate 18 (breaths/min): Photos: [1:No Photos] [2:No Photos] [N/A:N/A] Wound Location: [1:Right Malleolus - Lateral] [2:Right Malleolus - Medial] [N/A:N/A] Wounding Event: [1:Gradually Appeared] [2:Gradually Appeared] [N/A:N/A] Primary Etiology: [1:Venous Leg Ulcer] [2:Venous Leg Ulcer] [N/A:N/A] Comorbid History: [1:Cataracts, Chronic Obstructive Pulmonary Disease (COPD), Osteoarthritis] [2:Cataracts, Chronic Obstructive Pulmonary Disease (COPD), Osteoarthritis] [N/A:N/A] Date Acquired: [1:08/11/2015] [2:01/30/2016] [N/A:N/A] Weeks of Treatment: [1:28] [2:26] [N/A:N/A] Wound Status: [1:Open] [2:Open] [N/A:N/A] Clustered Wound: [1:No] [2:Yes] [N/A:N/A] Measurements L x W x  D 7.7x8.3x0.2 [2:3.6x2.2x0.2] [N/A:N/A] (cm) Area (cm) : [1:50.195] [2:6.22] [N/A:N/A] Volume (cm) : [1:10.039] [2:1.244] [N/A:N/A] % Reduction in Area: [1:-190.50%] [2:46.10%] [N/A:N/A] % Reduction in Volume: -93.70% [2:-7.70%] [N/A:N/A] Classification: [1:Full Thickness Without Exposed Support Structures] [2:Full Thickness Without Exposed Support Structures] [N/A:N/A] Exudate Amount: [1:Large] [2:Large] [N/A:N/A] Exudate Type: [1:Serosanguineous] [2:Serosanguineous] [N/A:N/A] Exudate Color: [1:red, brown] [2:red, brown] [N/A:N/A] Wound Margin: [1:Distinct, outline attached] [2:Distinct, outline attached] [N/A:N/A] Granulation Amount: [1:Medium (34-66%)] [2:Medium (34-66%)] [N/A:N/A] Granulation Quality: [1:Red] [2:Red] [N/A:N/A] Necrotic Amount: [1:Medium (34-66%)] [2:Medium (34-66%)] [N/A:N/A] Exposed Structures: Fascia: No Fascia: No N/A Fat: No Fat:  No Tendon: No Tendon: No Muscle: No Muscle: No Joint: No Joint: No Bone: No Bone: No Limited to Skin Limited to Skin Breakdown Breakdown Epithelialization: None None N/A Periwound Skin Texture: No Abnormalities Noted No Abnormalities Noted N/A Periwound Skin Maceration: Yes Maceration: Yes N/A Moisture: Moist: Yes Moist: Yes Periwound Skin Color: No Abnormalities Noted No Abnormalities Noted N/A Temperature: No Abnormality No Abnormality N/A Tenderness on Yes Yes N/A Palpation: Wound Preparation: Ulcer Cleansing: Ulcer Cleansing: N/A Rinsed/Irrigated with Rinsed/Irrigated with Saline, Other: soap and Saline, Other: soap and water water Topical Anesthetic Topical Anesthetic Applied: Other: lidocaine Applied: Other: lidocaine 4% 4% Treatment Notes Electronic Signature(s) Signed: 07/31/2016 4:57:07 PM By: Alejandro MullingPinkerton, Debra Entered By: Alejandro MullingPinkerton, Debra on 07/31/2016 08:20:14 [name], Tymeer C. ([id number]) -------------------------------------------------------------------------------- Multi-Disciplinary Care Plan Details [name] RingerBLACKWELL, Jamison Date of Service: 07/31/2016 8:00 AM Patient Name: C. Patient Account Number: [id number] Medical Record Treating RN: Phillis Haggisinkerton, [name] [id number] Number: Other Clinician: Date of Birth/Sex: 1946/01/13 41(70 y.o. Male) Treating Baltazar NajjarOBSON, [name] Primary Care Physician: Physician/Extender: G Referring Physician: Tania AdeWeeks in Treatment: 28 Active Inactive Venous Leg Ulcer Nursing Diagnoses: Knowledge deficit related to disease process and management Goals: Patient will maintain optimal edema control Date Initiated: 04/03/2016 Goal Status: Active Interventions: Compression as ordered Treatment Activities: Therapeutic compression applied : 04/03/2016 Notes: Wound/Skin Impairment Nursing Diagnoses: Impaired tissue integrity Goals: Ulcer/skin breakdown will heal within 14 weeks Date Initiated: 01/17/2016 Goal Status:  Active Interventions: Assess ulceration(s) every visit Notes: Electronic Signature(s) Signed: 07/31/2016 4:57:07 PM By: Aurelio JewPinkerton, Debra Hayman, [name] ([id number]) Entered By: Alejandro MullingPinkerton, Debra on 07/31/2016 08:20:09 [name], Almer C. ([id number]) -------------------------------------------------------------------------------- Pain Assessment Details [name] RingerBLACKWELL, Aldine Date of Service: 07/31/2016 8:00 AM Patient Name: C. Patient Account Number: [id number] Medical Record Treating RN: Phillis Haggisinkerton, [name] [id number] Number: Other Clinician: Date of Birth/Sex: 1946/01/13 43(70 y.o. Male) Treating [name] Primary Care Physician: Physician/Extender: G Referring Physician: Tania AdeWeeks in Treatment: 28 Active Problems Location of Pain Severity and Description of Pain Patient Has Paino Yes Site Locations Pain Location: Pain in Ulcers With Dressing Change: No Rate the pain. Current Pain Level: 5 Character of Pain Describe the Pain: Burning Pain Management and Medication Current Pain Management: Electronic Signature(s) Signed: 07/31/2016 4:57:07 PM By: Alejandro MullingPinkerton, Debra Entered By: Alejandro MullingPinkerton, Debra on 07/31/2016 08:04:19 [name], Cyncere C. ([id number]) -------------------------------------------------------------------------------- Patient/Caregiver Education Details [name] RingerBLACKWELL, Suhail Date of Service: 07/31/2016 8:00 AM Patient Name: C. Patient Account Number: [id number] Medical Record Treating RN: Phillis Haggisinkerton, [name] [id number] Number: Other Clinician: Date of Birth/Gender: 1946/01/13 75(70 y.o. Male) Treating [name] Primary Care Physician: Physician/Extender: G Referring Physician: Tania AdeWeeks in Treatment: 4428 Education Assessment Education Provided To: Patient Education Topics Provided Wound/Skin Impairment: Handouts: Other: do not get wrap wet Methods: Demonstration, Explain/Verbal Responses: State content correctly Electronic Signature(s) Signed: 07/31/2016 4:57:07 PM  By: Alejandro MullingPinkerton, Debra Entered By: Alejandro MullingPinkerton, Debra on 07/31/2016 09:38:32 [name], Tiffany C. ([id number]) -------------------------------------------------------------------------------- Wound Assessment Details [name] RingerBLACKWELL, Aldous Date of Service: 07/31/2016 8:00 AM Patient Name: C. Patient  Account Number: [id number] Medical Record Treating RN: Phillis Haggisinkerton, [name] [id number] Number: Other Clinician: Date of Birth/Sex: Mar 07, 1946 [age] y.o. Male) Treating [name] Primary Care Physician: Physician/Extender: G Referring Physician: Tania AdeWeeks in Treatment: 28 Wound Status Wound Number: 1 Primary Venous Leg Ulcer Etiology: Wound Location: Right Malleolus - Lateral Wound Open Wounding Event: Gradually Appeared Status: Date Acquired: 08/11/2015 Comorbid Cataracts, Chronic Obstructive Weeks Of Treatment: 28 History: Pulmonary Disease (COPD), Clustered Wound: No Osteoarthritis Photos Photo Uploaded By: Alejandro MullingPinkerton, Debra on 07/31/2016 10:18:35 Wound Measurements Length: (cm) 7.7 Width: (cm) 8.3 Depth: (cm) 0.2 Area: (cm) 50.195 Volume: (cm) 10.039 % Reduction in Area: -190.5% % Reduction in Volume: -93.7% Epithelialization: None Tunneling: No Undermining: No Wound Description Full Thickness Without Exposed Classification: Support Structures Wound Margin: Distinct, outline attached Exudate Large Amount: Exudate Type: Serosanguineous Exudate Color: red, brown Foul Odor After Cleansing: No Wound Bed Butrick, Maison C. ([id number]) Granulation Amount: Medium (34-66%) Exposed Structure Granulation Quality: Red Fascia Exposed: No Necrotic Amount: Medium (34-66%) Fat Layer Exposed: No Necrotic Quality: Adherent Slough Tendon Exposed: No Muscle Exposed: No Joint Exposed: No Bone Exposed: No Limited to Skin Breakdown Periwound Skin Texture Texture Color No Abnormalities Noted: No No Abnormalities Noted: No Moisture Temperature / Pain No Abnormalities Noted:  No Temperature: No Abnormality Maceration: Yes Tenderness on Palpation: Yes Moist: Yes Wound Preparation Ulcer Cleansing: Rinsed/Irrigated with Saline, Other: soap and water, Topical Anesthetic Applied: Other: lidocaine 4%, Treatment Notes Wound #1 (Right, Lateral Malleolus) 1. Cleansed with: Clean wound with Normal Saline Cleanse wound with antibacterial soap and water 2. Anesthetic Topical Lidocaine 4% cream to wound bed prior to debridement 3. Peri-wound Care: Barrier cream 5. Secondary Dressing Applied ABD Pad 7. Secured with Tape 4-Layer Compression System - Right Lower Extremity Notes unna to anchor, exufiber ag, drawtex, Armed forces operational officerxtrasorb Electronic Signature(s) Signed: 07/31/2016 4:57:07 PM By: Alejandro MullingPinkerton, Debra Entered By: Alejandro MullingPinkerton, Debra on 07/31/2016 08:19:57 [name], Eros C. ([id number]) -------------------------------------------------------------------------------- Wound Assessment Details [name] RingerBLACKWELL, Zebadiah Date of Service: 07/31/2016 8:00 AM Patient Name: C. Patient Account Number: [id number] Medical Record Treating RN: Phillis Haggisinkerton, [name] [id number] Number: Other Clinician: Date of Birth/Sex: Mar 07, 1946 [age] y.o. Male) Treating [name] Primary Care Physician: Physician/Extender: G Referring Physician: Tania AdeWeeks in Treatment: 28 Wound Status Wound Number: 2 Primary Venous Leg Ulcer Etiology: Wound Location: Right Malleolus - Medial Wound Open Wounding Event: Gradually Appeared Status: Date Acquired: 01/30/2016 Comorbid Cataracts, Chronic Obstructive Weeks Of Treatment: 26 History: Pulmonary Disease (COPD), Clustered Wound: Yes Osteoarthritis Photos Photo Uploaded By: Alejandro MullingPinkerton, Debra on 07/31/2016 10:18:36 Wound Measurements Length: (cm) 3.6 Width: (cm) 2.2 Depth: (cm) 0.2 Area: (cm) 6.22 Volume: (cm) 1.244 % Reduction in Area: 46.1% % Reduction in Volume: -7.7% Epithelialization: None Tunneling: No Undermining: No Wound Description Full  Thickness Without Exposed Classification: Support Structures Wound Margin: Distinct, outline attached Exudate Large Amount: Exudate Type: Serosanguineous Exudate Color: red, brown Foul Odor After Cleansing: No Wound Bed Huebsch, Selestino C. ([id number]) Granulation Amount: Medium (34-66%) Exposed Structure Granulation Quality: Red Fascia Exposed: No Necrotic Amount: Medium (34-66%) Fat Layer Exposed: No Necrotic Quality: Adherent Slough Tendon Exposed: No Muscle Exposed: No Joint Exposed: No Bone Exposed: No Limited to Skin Breakdown Periwound Skin Texture Texture Color No Abnormalities Noted: No No Abnormalities Noted: No Moisture Temperature / Pain No Abnormalities Noted: No Temperature: No Abnormality Maceration: Yes Tenderness on Palpation: Yes Moist: Yes Wound Preparation Ulcer Cleansing: Rinsed/Irrigated with Saline, Other: soap and water, Topical Anesthetic Applied: Other: lidocaine 4%, Treatment Notes Wound #2 (Right, Medial Malleolus) 1. Cleansed with: Clean wound with Normal  Saline Cleanse wound with antibacterial soap and water 2. Anesthetic Topical Lidocaine 4% cream to wound bed prior to debridement 3. Peri-wound Care: Barrier cream 5. Secondary Dressing Applied ABD Pad 7. Secured with Tape 4-Layer Compression System - Right Lower Extremity Notes unna to anchor, exufiber ag, drawtex, xtrasorb Electronic Signature(s) Signed: 07/31/2016 4:57:07 PM By: Alejandro Mulling Entered By: Alejandro Mulling on 07/31/2016 08:15:56 [name] ([id number]) -------------------------------------------------------------------------------- Vitals Details [name] Date of Service: 07/31/2016 8:00 AM Patient Name: C. Patient Account Number: [id number] Medical Record Treating RN: [name] [id number] Number: Other Clinician: Date of Birth/Sex: [birth year] (70 y.o. Male) Treating [name] Primary Care Physician: Physician/Extender:  G Referring Physician: Tania Ade in Treatment: 28 Vital Signs Time Taken: 08:06 Temperature (F): 97.4 Height (in): 76 Pulse (bpm): 68 Weight (lbs): 238 Respiratory Rate (breaths/min): 18 Body Mass Index (BMI): 29 Blood Pressure (mmHg): 152/72 Reference Range: 80 - 120 mg / dl Electronic Signature(s) Signed: 07/31/2016 4:57:07 PM By: Alejandro Mulling Entered By: Alejandro Mulling on 07/31/2016 08:06:26

## 2016-08-01 NOTE — Progress Notes (Signed)
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 3.6cm length x 2.2cm width x 0.2cm depth; 6.22cm^2 area and 1.244cm^3 volume. The wound is limited to skin breakdown. There is no tunneling or undermining noted. There is a large amount of serosanguineous drainage noted. The wound margin is distinct with the outline attached to the wound base. There is medium (34-66%) red granulation within the wound bed. There is a medium (34-66%) amount of necrotic tissue within the wound bed including Adherent Slough. The periwound skin appearance exhibited: Maceration, Moist. Periwound temperature was noted as No Abnormality. The periwound has tenderness on palpation. 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 No tub bath. Wound #2 Right,Medial Malleolus: Cleanse wound with mild soap and water May shower with protection. No tub bath. Skin Barriers/Peri-Wound Care: Wound #1 Right,Lateral Malleolus: Barrier cream - zinc oxide Moisturizing lotion Wound #2 Right,Medial Malleolus: Barrier cream - zinc oxide Moisturizing lotion Primary Wound Dressing: Wound #1 Right,Lateral Malleolus: Exufiber Ag+ Wound #2 Right,Medial Malleolus: Exufiber Ag+ Secondary Dressing: [name], Yehia C. ([id number]) Wound #1 Right,Lateral Malleolus: ABD pad Dry Gauze XtraSorb Drawtex Wound #2 Right,Medial Malleolus: ABD pad Dry Gauze XtraSorb Drawtex Follow-up Appointments: Wound #1 Right,Lateral Malleolus: Return Appointment in 1 week. Edema Control: Wound #1 Right,Lateral Malleolus: 4-Layer Compression System - Right Lower Extremity - UNNA TO ANCHOR Wound #2 Right,Medial Malleolus: 4-Layer Compression System - Right Lower Extremity - UNNA TO ANCHOR Additional Orders / Instructions: Wound #1 Right,Lateral Malleolus: Increase protein intake. OK to return to work with the following restrictions: Activity as tolerated Wound #2 Right,Medial Malleolus: Increase protein intake. OK to return to work with the following restrictions: Activity as tolerated Medications-please add to medication list.: Wound #1 Right,Lateral Malleolus: P.O. Antibiotics - Doxycycline 100mg  #1 I gave him empiric doxycycline. I didn't see anything that was worth culturing. #2 switch the dressings to silver alginate. Drawtex #3 we'll bring him back on Friday for a nurse check #4 previously cultured MSSA from this area and June, this is the reason for the doxycycline. Electronic Signature(s) Signed: 07/31/2016 5:08:56 PM By: Baltazar Najjarobson, Nikki Rusnak MD Entered By: Baltazar Najjarobson, Joane Postel on 07/31/2016  08:46:29 [name], Muscab C. ([id number]) [name], [name] ([id number]) -------------------------------------------------------------------------------- Conley RollsSuperBill Details [name], Mcihael Date of Service: 07/31/2016  Patient Name: C. Patient Account Number: [id number] Medical Record Treating RN: [name] [id number] Number: Other Clinician: Date of Birth/Sex: 1946-02-04 (70 y.o. 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

## 2016-08-03 ENCOUNTER — Encounter: Payer: Medicare HMO | Admitting: Surgery

## 2016-08-03 ENCOUNTER — Other Ambulatory Visit
Admission: RE | Admit: 2016-08-03 | Discharge: 2016-08-03 | Disposition: A | Discharge status: Home / Self Care | Payer: Medicare HMO | Source: Ambulatory Visit | Attending: Surgery | Admitting: Surgery

## 2016-08-03 DIAGNOSIS — B999 Unspecified infectious disease: Secondary | ICD-10-CM | POA: Insufficient documentation

## 2016-08-03 DIAGNOSIS — I87331 Chronic venous hypertension (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: Secondary | ICD-10-CM | POA: Diagnosis not present

## 2016-08-04 NOTE — Progress Notes (Signed)
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 significantly better at this point in time. His edema continues to be controlled well with the 4-layer compression wrap and he really has not noted any odor at this  point in time. He is tolerating the dressing changes when they are performed well. 06/05/16 at this point in time today patient currently shows no interval signs or symptoms of local or systemic infection. Again his pain level he rates to be a 1 out of 10 at most and overall he tells me that generally this is not giving him much trouble. In fact he even feels maybe a little bit better than last week. We have continue with the 4-layer compression wrap in which she tolerates very well at this point. He is continuing to utilize the [place]. 06/12/16 I think there has been some progression in the status of both of these wounds over today again covered in a gelatinous surface. Has been using Hydrofera Blue. We had used Iodoflex in the past I'm not sure if there was an issue other than changing to something that might progress towards closure faster 06/19/16; he did not tolerate the Flexeril last week secondary to pain and this was changed on Friday back to [facility] area he continues to have copious amounts of gelatinous surface slough which is think inhibiting the speed of healing this area 06/26/16 patient over the last week has utilized the Santyl to try to loosen up some of the tightly adherent slough that was noted on evaluation last week. The good news is he tells me that the medial malleoli region really  does not bother him the right llateral malleoli region is more tender to palpation at this point in time especially in the central/inferior location. However it does appear that the Santyl has done his job to loosen up the adherent slough at this point in time. Fortunately he has no interval signs or symptoms of infection locally or systemically no purulent discharge noted. 07/03/16 at this point in time today patient's wounds appear to be significantly improved over the right medial and lateral malleolus locations. He has much less tenderness at this point in time and the wounds appear clean her although there is still adherent slough this is sufficiently improved over what I saw last week. I still see no evidence of local infection. 07/10/16; continued gradual improvement in the right medial and lateral malleolus locations. The lateral is more substantial wound now divided into 2 by a rim of normal epithelialization. 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 He denies any complaints of pain or intolerance to compression. Both ulcers are smaller in size oriented today's measurements, both are covered with a softly adherent slough. 07/24/16; medial wound is smaller, lateral about the same although surface looks better. Still using Hydrofera Blue 07/31/16; arrives today complaining of pain in the lateral part of his foot. Nurse reports a lot more drainage. REICE, BIENVENUE ([id number]) He has been using Hydrofera Blue. Switch to silver alginate today 08/03/2016 -- I was asked to see the patient was here for a nurse visit today. I understand he had a lot of pain in his right lower extremity and was having blisters on his right foot which have not been there before. Though he started on doxycycline he does not have blisters elsewhere on his body. I do not believe this is  a drug allergy. also mentioned that there was a copious purulent discharge from the wound and clinically there is no evidence of cellulitis. Objective Constitutional Pulse regular. Respirations normal and unlabored. Afebrile. Vitals Time Taken: 8:01 AM, Height: 76  in, Weight: 238 lbs, BMI: 29, Temperature: 97.6 F, Pulse: 70 bpm, Respiratory Rate: 18 breaths/min, Blood Pressure: 156/76 mmHg. Eyes Nonicteric. Reactive to light. Ears, Nose, Mouth, and Throat Lips, teeth, and gums WNL.Marland Kitchen Moist mucosa without lesions. Neck supple and nontender. No palpable supraclavicular or cervical adenopathy. Normal sized without goiter. Respiratory WNL. No retractions.. Breath sounds WNL, No rubs, rales, rhonchi, or wheeze.. Cardiovascular Heart rhythm and rate regular, no murmur or gallop.. Pedal Pulses WNL. No clubbing, cyanosis or edema. Chest Breasts symmetical and no nipple discharge.. Breast tissue WNL, no masses, lumps, or tenderness.. Lymphatic No adneopathy. No adenopathy. No adenopathy. Musculoskeletal Adexa without tenderness or enlargement.. Digits and nails w/o clubbing, cyanosis, infection, petechiae, ischemia, or inflammatory conditions.Marland Kitchen Psychiatric Judgement and insight Intact.. No evidence of depression, anxiety, or agitation.Marland Kitchen LAQUINN, SHIPPY ([id number]) General Notes: the wound is covered with significant amount of slough but there is no evidence of cellulitis and at present time I see no evidence of gross infection. The area around his toes are basically blisters which may have been caused by a 4-layer wrap which was tied to tight. I have washed at the wound profusely with saline and removed some of the external slough and then taken some deep tissue cultures. Integumentary (Hair, Skin) No suspicious lesions. No crepitus or fluctuance. No peri-wound warmth or erythema. No masses.. Wound #1 status is Open. Original cause of wound was Gradually Appeared. The wound is located  on the Right,Lateral Malleolus. The wound measures 7.7cm length x 8.2cm width x 0.2cm depth; 49.59cm^2 area and 9.918cm^3 volume. The wound is limited to skin breakdown. There is no tunneling or undermining noted. There is a large amount of purulent drainage noted. The wound margin is distinct with the outline attached to the wound base. There is medium (34-66%) red granulation within the wound bed. There is a medium (34-66%) amount of necrotic tissue within the wound bed including Adherent Slough. The periwound skin appearance exhibited: Maceration, Moist. Periwound temperature was noted as No Abnormality. The periwound has tenderness on palpation. Wound #2 status is Open. Original cause of wound was Gradually Appeared. The wound is located on the Right,Medial Malleolus. The wound measures 4.7cm length x 3cm width x 0.2cm depth; 11.074cm^2 area and 2.215cm^3 volume. The wound is limited to skin breakdown. There is no tunneling or undermining noted. There is a large amount of purulent drainage noted. The wound margin is distinct with the outline attached to the wound base. There is medium (34-66%) red granulation within the wound bed. There is a medium (34-66%) amount of necrotic tissue within the wound bed including Adherent Slough. The periwound skin appearance exhibited: Maceration, Moist. Periwound temperature was noted as No Abnormality. The periwound has tenderness on palpation. 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 There was a  Skin/Subcutaneous Tissue Debridement ([id number]) debridement with total area of 21 sq cm performed by Evlyn Kanner, MD. with the following instrument(s): Curette to remove Viable and Non-Viable tissue/material including Exudate, Marut, Elwyn C. ([id number]) Fibrin/Slough, and Subcutaneous after achieving pain control using Lidocaine 4% Topical Solution. 1 Specimen was taken by a Swab and sent to the  lab per facility protocol.A time out was conducted at 08:18, prior to the start of the procedure. A Minimum amount of bleeding was controlled with Pressure. The procedure was tolerated well with a pain level of 0 throughout and a pain level of 0 following the procedure. Post Debridement Measurements: 7.7cm length x 8.2cm width x 0.2cm depth; 9.918cm^3 volume. Character of Wound/Ulcer Post Debridement requires further debridement. Severity of Tissue Post Debridement is: Fat layer exposed. Post procedure Diagnosis Wound #1: Same as Pre-Procedure General Notes: deep tissue cultures were taken for study. Wound #2 Wound #2 is a Venous Leg Ulcer located on the Right,Medial Malleolus . There was a Skin/Subcutaneous Tissue Debridement ([id number]) debridement with total area of 15 sq cm performed by Evlyn Kanner, MD. with the following instrument(s): Curette to remove Viable and Non-Viable tissue/material including Exudate, Fibrin/Slough, and Subcutaneous after achieving pain control using Lidocaine 4% Topical Solution. A time out was conducted at 08:18, prior to the start of the procedure. A Minimum amount of bleeding was controlled with Pressure. The procedure was tolerated well with a pain level of 0 throughout and a pain level of 0 following the procedure. Post Debridement Measurements: 4.7cm length x 3cm width x 0.2cm depth; 2.215cm^3 volume. Character of Wound/Ulcer Post Debridement requires further debridement. Severity of Tissue Post Debridement is: Fat layer exposed. Post procedure  Diagnosis Wound #2: Same as Pre-Procedure Plan Wound Cleansing: Wound #1 Right,Lateral Malleolus: Cleanse wound with mild soap and water May shower with protection. No tub bath. Wound #2 Right,Medial Malleolus: Cleanse wound with mild soap and water May shower with protection. No tub bath. Skin Barriers/Peri-Wound Care: Wound #1 Right,Lateral Malleolus: Barrier cream - zinc oxide Moisturizing lotion Wound #2 Right,Medial Malleolus: Barrier cream - zinc oxide Moisturizing lotion Primary Wound Dressing: Wound #1 Right,Lateral Malleolus: Aquacel Ag Wound #2 Right,Medial Malleolus: Ehrmann, Monish C. ([id number]) Aquacel Ag Secondary Dressing: Wound #1 Right,Lateral Malleolus: ABD pad Dry Gauze XtraSorb Drawtex Wound #2 Right,Medial Malleolus: ABD pad Dry Gauze XtraSorb Drawtex Follow-up Appointments: Wound #1 Right,Lateral Malleolus: Return Appointment in 1 week. Edema Control: Wound #1 Right,Lateral Malleolus: 3 Layer Compression System - Bilateral - UNNA TO ANCHOR Wound #2 Right,Medial Malleolus: 3 Layer Compression System - Bilateral - UNNA TO ANCHOR Additional Orders / Instructions: Wound #1 Right,Lateral Malleolus: Increase protein intake. OK to return to work with the following restrictions: Activity as tolerated Wound #2 Right,Medial Malleolus: Increase protein intake. OK to return to work with the following restrictions: Activity as tolerated Medications-please add to medication list.: Wound #1 Right,Lateral Malleolus: P.O. Antibiotics - Doxycycline 100mg  Laboratory ordered were: Wound culture routine - right lateral malleolus having reviewed his wound and his general health I have recommended: 1. Silver alginate and drawtex and possibly ABDs below his 3 layer compression wrap. 2. I do not believe this is a drug allergy and he can continue with his doxycycline 3. Await the wound culture report prior to changing or supplementing his antibiotic 4.  Elevation and exercise 5. If he develops signs of cellulitis or septicemia, I have urged him to report immediately to the ER. Electronic Signature(s) BASSEM, BERNASCONI ([id number]) Signed: 08/03/2016 8:42:55 AM By: Evlyn Kanner MD, FACS Entered By: Evlyn Kanner on 08/03/2016 08:42:55 [name] ([id number]) -------------------------------------------------------------------------------- SuperBill Details Patient Name: [name] Date of Service: 08/03/2016 Medical Record Number: [id number] Patient Account Number: [id number] Date of Birth/Sex: 1945/10/26 (70 y.o. 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, initial T85.613A encounter Facility Procedures CPT4: Description Modifier Quantity Code 16109604 11042 - DEB SUBQ TISSUE 20 SQ CM/< 1 ICD-10 Description Diagnosis I87.331 Chronic venous hypertension (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 L97.212 Non-pressure chronic ulcer  of right calf with fat layer exposed T85.613A 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, initial encounter CPT4: 54098119 11045 - DEB SUBQ TISS EA ADDL 20CM 1 ICD-10 Description Diagnosis I87.331 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

## 2016-08-04 NOTE — Progress Notes (Signed)
[name], [name] ([id number]) Visit Report for 08/03/2016 Arrival Information Details Patient Name: [name], [name]. Date of Service: 08/03/2016 8:00 AM Medical Record Number: [id number] Patient Account Number: [id number] Date of Birth/Sex: 1946-07-21 (70 y.o. Male) Treating RN: Ashok Cordia, [name] Primary Care Physician: Other Clinician: Referring Physician: Treating Physician/Extender: Rudene Re in Treatment: 28 Visit Information History Since Last Visit All ordered tests and consults were completed: No Patient Arrived: Ambulatory Added or deleted any medications: No Arrival Time: 08:00 Any new allergies or adverse reactions: No Accompanied By: self Had a fall or experienced change in No Transfer Assistance: None activities of daily living that may affect Patient Identification Verified: Yes risk of falls: Secondary Verification Process Yes Signs or symptoms of abuse/neglect since last No Completed: visito Patient Requires Transmission-Based No Hospitalized since last visit: No Precautions: Pain Present Now: No Patient Has Alerts: No Electronic Signature(s) Signed: 08/03/2016 12:21:12 PM By: Alejandro Mulling Entered By: Alejandro Mulling on 08/03/2016 08:00:52 [name] ([id number]) -------------------------------------------------------------------------------- Encounter Discharge Information Details Patient Name: [name] Date of Service: 08/03/2016 8:00 AM Medical Record Number: [id number] Patient Account Number: [id number] Date of Birth/Sex: [birth year] (70 y.o. Male) Treating RN: [name] Primary Care Physician: Other Clinician: Referring Physician: Treating Physician/Extender: Rudene Re in Treatment: 49 Encounter Discharge Information Items Discharge Pain Level: 0 Discharge Condition: Stable Ambulatory Status: Ambulatory Discharge Destination: Home Transportation: Private Auto Accompanied By: self Schedule  Follow-up Appointment: Yes Medication Reconciliation completed Yes and provided to Patient/Care Luccas Towell: Patient Clinical Summary of Care: Declined Electronic Signature(s) Signed: 08/03/2016 12:21:12 PM By: Alejandro Mulling Previous Signature: 08/03/2016 8:45:16 AM Version By: Gwenlyn Perking Entered By: Alejandro Mulling on 08/03/2016 10:37:29 [name] ([id number]) -------------------------------------------------------------------------------- Lower Extremity Assessment Details Patient Name: [name] Date of Service: 08/03/2016 8:00 AM Medical Record Number: [id number] Patient Account Number: [id number] Date of Birth/Sex: Feb 17, 1946 (70 y.o. Male) Treating RN: [name] Primary Care Physician: Other Clinician: Referring Physician: Treating Physician/Extender: Rudene Re in Treatment: 28 Edema Assessment Assessed: [Left: No] [Right: No] E[Left: dema] [Right: :] Calf Left: Right: Point of Measurement: 40 cm From Medial Instep cm 34.2 cm Ankle Left: Right: Point of Measurement: 12 cm From Medial Instep cm 23.6 cm Vascular Assessment Pulses: Posterior Tibial Extremity colors, hair growth, and conditions: Extremity Color: [Right:Hyperpigmented] Temperature of Extremity: [Right:Warm] Capillary Refill: [Right:< 3 seconds] Toe Nail Assessment Left: Right: Thick: Yes Discolored: Yes Deformed: Yes Improper Length and Hygiene: Yes Electronic Signature(s) Signed: 08/03/2016 12:21:12 PM By: Alejandro Mulling Entered By: Alejandro Mulling on 08/03/2016 08:06:35 [name] ([id number]) -------------------------------------------------------------------------------- Multi Wound Chart Details Patient Name: [name] Date of Service: 08/03/2016 8:00 AM Medical Record Number: [id number] Patient Account Number: [id number] Date of Birth/Sex: Sep 29, 1945 (70 y.o. Male) Treating RN: [name] Primary Care Physician: Other  Clinician: Referring Physician: Treating Physician/Extender: Rudene Re in Treatment: 28 Vital Signs Height(in): 76 Pulse(bpm): 70 Weight(lbs): 238 Blood Pressure 156/76 (mmHg): Body Mass Index(BMI): 29 Temperature(F): 97.6 Respiratory Rate 18 (breaths/min): Photos: [1:No Photos] [2:No Photos] [N/A:N/A] Wound Location: [1:Right Malleolus - Lateral] [2:Right Malleolus - Medial] [N/A:N/A] Wounding Event: [1:Gradually Appeared] [2:Gradually Appeared] [N/A:N/A] Primary Etiology: [1:Venous Leg Ulcer] [2:Venous Leg Ulcer] [N/A:N/A] Comorbid History: [1:Cataracts, Chronic Obstructive Pulmonary Disease (COPD), Osteoarthritis] [2:Cataracts, Chronic Obstructive Pulmonary Disease (COPD), Osteoarthritis] [N/A:N/A] Date Acquired: [1:08/11/2015] [2:01/30/2016] [N/A:N/A] Weeks of Treatment: [1:28] [2:26] [N/A:N/A] Wound Status: [1:Open] [2:Open] [N/A:N/A] Clustered Wound: [1:No] [2:Yes] [N/A:N/A] Measurements L x W x D 7.7x8.2x0.2 [2:4.7x3x0.2] [N/A:N/A] (cm) Area (cm) : [1:49.59] [2:11.074] [N/A:N/A] Volume (  cm) : [1:9.918] [2:2.215] [N/A:N/A] % Reduction in Area: [1:-187.00%] [2:4.10%] [N/A:N/A] % Reduction in Volume: -91.30% [2:-91.80%] [N/A:N/A] Classification: [1:Full Thickness Without Exposed Support Structures] [2:Full Thickness Without Exposed Support Structures] [N/A:N/A] Exudate Amount: [1:Large] [2:Large] [N/A:N/A] Exudate Type: [1:Purulent] [2:Purulent] [N/A:N/A] Exudate Color: [1:yellow, brown, green] [2:yellow, brown, green] [N/A:N/A] Wound Margin: [1:Distinct, outline attached] [2:Distinct, outline attached] [N/A:N/A] Granulation Amount: [1:Medium (34-66%)] [2:Medium (34-66%)] [N/A:N/A] Granulation Quality: [1:Red] [2:Red] [N/A:N/A] Necrotic Amount: [1:Medium (34-66%)] [2:Medium (34-66%)] [N/A:N/A] Exposed Structures: [1:Fascia: No Fat: No] [2:Fascia: No Fat: No] [N/A:N/A] Tendon: No Tendon: No Muscle: No Muscle: No Joint: No Joint: No Bone: No Bone:  No Limited to Skin Limited to Skin Breakdown Breakdown Epithelialization: None None N/A Periwound Skin Texture: No Abnormalities Noted No Abnormalities Noted N/A Periwound Skin Maceration: Yes Maceration: Yes N/A Moisture: Moist: Yes Moist: Yes Periwound Skin Color: No Abnormalities Noted No Abnormalities Noted N/A Temperature: No Abnormality No Abnormality N/A Tenderness on Yes Yes N/A Palpation: Wound Preparation: Ulcer Cleansing: Ulcer Cleansing: N/A Rinsed/Irrigated with Rinsed/Irrigated with Saline, Other: soap and Saline, Other: soap and water water Topical Anesthetic Topical Anesthetic Applied: Other: lidocaine Applied: Other: lidocaine 4% 4% Treatment Notes Electronic Signature(s) Signed: 08/03/2016 12:21:12 PM By: Alejandro MullingPinkerton, Debra Entered By: Alejandro MullingPinkerton, Debra on 08/03/2016 08:13:31 [name] StackBLACKWELL, [name] ([id number]) -------------------------------------------------------------------------------- Multi-Disciplinary Care Plan Details Patient Name: [name] StackBLACKWELL, [name] Date of Service: 08/03/2016 8:00 AM Medical Record Number: [id number] Patient Account Number: [id number] Date of Birth/Sex: February 12, 1946 [age] y.o. Male) Treating RN: Phillis HaggisPinkerton, [name] Primary Care Physician: Other Clinician: Referring Physician: Treating Physician/Extender: Rudene ReBritto, Errol Weeks in Treatment: 28 Active Inactive Venous Leg Ulcer Nursing Diagnoses: Knowledge deficit related to disease process and management Goals: Patient will maintain optimal edema control Date Initiated: 04/03/2016 Goal Status: Active Interventions: Compression as ordered Treatment Activities: Therapeutic compression applied : 04/03/2016 Notes: Wound/Skin Impairment Nursing Diagnoses: Impaired tissue integrity Goals: Ulcer/skin breakdown will heal within 14 weeks Date Initiated: 01/17/2016 Goal Status: Active Interventions: Assess ulceration(s) every visit Notes: Electronic Signature(s) Signed: 08/03/2016 12:21:12 PM  By: Alejandro MullingPinkerton, Debra Entered By: Alejandro MullingPinkerton, Debra on 08/03/2016 08:13:26 [name] StackBLACKWELL, [name] ([name]) Garden, [name] ([id number]) -------------------------------------------------------------------------------- Pain Assessment Details Patient Name: [name] StackBLACKWELL, [name] Date of Service: 08/03/2016 8:00 AM Medical Record Number: [id number] Patient Account Number: [id number] Date of Birth/Sex: February 12, 1946 [age] y.o. Male) Treating RN: Phillis HaggisPinkerton, [name] Primary Care Physician: Other Clinician: Referring Physician: Treating Physician/Extender: Rudene ReBritto, Errol Weeks in Treatment: 28 Active Problems Location of Pain Severity and Description of Pain Patient Has Paino No Site Locations With Dressing Change: No Pain Management and Medication Current Pain Management: Electronic Signature(s) Signed: 08/03/2016 12:21:12 PM By: Alejandro MullingPinkerton, Debra Entered By: Alejandro MullingPinkerton, Debra on 08/03/2016 08:01:00 [name] StackBLACKWELL, Kiah C. ([id number]) -------------------------------------------------------------------------------- Patient/Caregiver Education Details Patient Name: [name] StackBLACKWELL, [name] Date of Service: 08/03/2016 8:00 AM Medical Record Number: [id number] Patient Account Number: [id number] Date of Birth/Gender: February 12, 1946 [age] y.o. Male) Treating RN: Phillis HaggisPinkerton, [name] Primary Care Physician: Other Clinician: Referring Physician: Treating Physician/Extender: Rudene ReBritto, Errol Weeks in Treatment: 28 Education Assessment Education Provided To: Patient Education Topics Provided Wound/Skin Impairment: Handouts: Other: do not get wrap wet Methods: Demonstration, Explain/Verbal Responses: State content correctly Electronic Signature(s) Signed: 08/03/2016 12:21:12 PM By: Alejandro MullingPinkerton, Debra Entered By: Alejandro MullingPinkerton, Debra on 08/03/2016 08:19:11 [name] StackBLACKWELL, [name] ([id number]) -------------------------------------------------------------------------------- Wound Assessment Details Patient Name: [name] StackBLACKWELL, [name]  C. Date of Service: 08/03/2016 8:00 AM Medical Record Number: [id number] Patient Account Number: [id number] Date of Birth/Sex: February 12, 1946 [age] y.o. Male) Treating RN: Phillis HaggisPinkerton, [name] Primary Care Physician: Other Clinician: Referring Physician: Treating Physician/Extender: Rudene ReBritto, Errol Weeks in Treatment:  28 Wound Status Wound Number: 1 Primary Venous Leg Ulcer Etiology: Wound Location: Right Malleolus - Lateral Wound Open Wounding Event: Gradually Appeared Status: Date Acquired: 08/11/2015 Comorbid Cataracts, Chronic Obstructive Weeks Of Treatment: 28 History: Pulmonary Disease (COPD), Clustered Wound: No Osteoarthritis Photos Photo Uploaded By: Alejandro MullingPinkerton, Debra on 08/03/2016 08:17:38 Wound Measurements Length: (cm) 7.7 Width: (cm) 8.2 Depth: (cm) 0.2 Area: (cm) 49.59 Volume: (cm) 9.918 % Reduction in Area: -187% % Reduction in Volume: -91.3% Epithelialization: None Tunneling: No Undermining: No Wound Description Full Thickness Without Exposed Classification: Support Structures Wound Margin: Distinct, outline attached Exudate Large Amount: Exudate Type: Purulent Exudate Color: yellow, brown, green Foul Odor After Cleansing: No Wound Bed Granulation Amount: Medium (34-66%) Exposed Structure Granulation Quality: Red Fascia Exposed: No [name], [name] ([id number]) Necrotic Amount: Medium (34-66%) Fat Layer Exposed: No Necrotic Quality: Adherent Slough Tendon Exposed: No Muscle Exposed: No Joint Exposed: No Bone Exposed: No Limited to Skin Breakdown Periwound Skin Texture Texture Color No Abnormalities Noted: No No Abnormalities Noted: No Moisture Temperature / Pain No Abnormalities Noted: No Temperature: No Abnormality Maceration: Yes Tenderness on Palpation: Yes Moist: Yes Wound Preparation Ulcer Cleansing: Rinsed/Irrigated with Saline, Other: soap and water, Topical Anesthetic Applied: Other: lidocaine 4%, Treatment Notes Wound #1  (Right, Lateral Malleolus) 1. Cleansed with: Clean wound with Normal Saline Cleanse wound with antibacterial soap and water 2. Anesthetic Topical Lidocaine 4% cream to wound bed prior to debridement 3. Peri-wound Care: Barrier cream Moisturizing lotion 4. Dressing Applied: Aquacel Ag 5. Secondary Dressing Applied ABD Pad Dry Gauze 7. Secured with Tape 3 Layer Compression System - Bilateral Notes unna to anchor, drawtex, xtrasorb Electronic Signature(s) Signed: 08/03/2016 12:21:12 PM By: Alejandro MullingPinkerton, Debra Entered By: Alejandro MullingPinkerton, Debra on 08/03/2016 08:12:01 [name] StackBLACKWELL, [name] ([id number]) -------------------------------------------------------------------------------- Wound Assessment Details Patient Name: [name] StackBLACKWELL, Amaan C. Date of Service: 08/03/2016 8:00 AM Medical Record Number: [id number] Patient Account Number: [id number] Date of Birth/Sex: 03/02/1946 [age] y.o. Male) Treating RN: Phillis HaggisPinkerton, [name] Primary Care Physician: Other Clinician: Referring Physician: Treating Physician/Extender: Rudene ReBritto, Errol Weeks in Treatment: 28 Wound Status Wound Number: 2 Primary Venous Leg Ulcer Etiology: Wound Location: Right Malleolus - Medial Wound Open Wounding Event: Gradually Appeared Status: Date Acquired: 01/30/2016 Comorbid Cataracts, Chronic Obstructive Weeks Of Treatment: 26 History: Pulmonary Disease (COPD), Clustered Wound: Yes Osteoarthritis Photos Photo Uploaded By: Alejandro MullingPinkerton, Debra on 08/03/2016 08:17:38 Wound Measurements Length: (cm) 4.7 Width: (cm) 3 Depth: (cm) 0.2 Area: (cm) 11.074 Volume: (cm) 2.215 % Reduction in Area: 4.1% % Reduction in Volume: -91.8% Epithelialization: None Tunneling: No Undermining: No Wound Description Full Thickness Without Exposed Classification: Support Structures Wound Margin: Distinct, outline attached Exudate Large Amount: Exudate Type: Purulent Exudate Color: yellow, brown, green Foul Odor After Cleansing:  No Wound Bed Granulation Amount: Medium (34-66%) Exposed Structure Granulation Quality: Red Fascia Exposed: No Railsback, Enoc C. ([id number]) Necrotic Amount: Medium (34-66%) Fat Layer Exposed: No Necrotic Quality: Adherent Slough Tendon Exposed: No Muscle Exposed: No Joint Exposed: No Bone Exposed: No Limited to Skin Breakdown Periwound Skin Texture Texture Color No Abnormalities Noted: No No Abnormalities Noted: No Moisture Temperature / Pain No Abnormalities Noted: No Temperature: No Abnormality Maceration: Yes Tenderness on Palpation: Yes Moist: Yes Wound Preparation Ulcer Cleansing: Rinsed/Irrigated with Saline, Other: soap and water, Topical Anesthetic Applied: Other: lidocaine 4%, Treatment Notes Wound #2 (Right, Medial Malleolus) 1. Cleansed with: Clean wound with Normal Saline Cleanse wound with antibacterial soap and water 2. Anesthetic Topical Lidocaine 4% cream to wound bed prior to debridement 3. Peri-wound Care: Barrier cream Moisturizing  lotion 4. Dressing Applied: Aquacel Ag 5. Secondary Dressing Applied ABD Pad Dry Gauze 7. Secured with Tape 3 Layer Compression System - Bilateral Notes unna to anchor, drawtex, xtrasorb Electronic Signature(s) Signed: 08/03/2016 12:21:12 PM By: Alejandro Mulling Entered By: Alejandro Mulling on 08/03/2016 08:11:19 [name] ([id number]) -------------------------------------------------------------------------------- Vitals Details Patient Name: [name] Date of Service: 08/03/2016 8:00 AM Medical Record Number: [id number] Patient Account Number: [id number] Date of Birth/Sex: 1946/04/02 (70 y.o. Male) Treating RN: [name] Primary Care Physician: Other Clinician: Referring Physician: Treating Physician/Extender: Rudene Re in Treatment: 28 Vital Signs Time Taken: 08:01 Temperature (F): 97.6 Height (in): 76 Pulse (bpm): 70 Weight (lbs): 238 Respiratory Rate  (breaths/min): 18 Body Mass Index (BMI): 29 Blood Pressure (mmHg): 156/76 Reference Range: 80 - 120 mg / dl Electronic Signature(s) Signed: 08/03/2016 12:21:12 PM By: Alejandro Mulling Entered By: Alejandro Mulling on 08/03/2016 08:05:34

## 2016-08-07 ENCOUNTER — Encounter: Payer: Medicare HMO | Admitting: Internal Medicine

## 2016-08-07 DIAGNOSIS — I87331 Chronic venous hypertension (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: Secondary | ICD-10-CM | POA: Diagnosis not present

## 2016-08-08 ENCOUNTER — Telehealth: Payer: Self-pay

## 2016-08-08 LAB — AEROBIC/ANAEROBIC CULTURE (SURGICAL/DEEP WOUND)

## 2016-08-08 LAB — AEROBIC/ANAEROBIC CULTURE W GRAM STAIN (SURGICAL/DEEP WOUND)

## 2016-08-08 NOTE — Progress Notes (Signed)
[name], Kamren C. ([id number]) Visit Report for 08/07/2016 Arrival Information Details [name], Abdinasir Date of Service: 08/07/2016 8:00 AM Patient Name: C. Patient Account Number: [id number] Medical Record Treating RN: Phillis Haggisinkerton, [name] [id number] Number: Other Clinician: Date of Birth/Sex: 1945/09/20 50(70 y.o. Male) Treating [name], [name] Primary Care Physician: Physician/Extender: G Referring Physician: Tania AdeWeeks in Treatment: 1129 Visit Information History Since Last Visit All ordered tests and consults were No Patient Arrived: Ambulatory completed: Arrival Time: 08:03 Added or deleted any medications: No Accompanied By: self Any new allergies or adverse reactions: No Transfer Assistance: None Had a fall or experienced change in No Patient Identification Verified: Yes activities of daily living that may affect Secondary Verification Process Yes risk of falls: Completed: Signs or symptoms of abuse/neglect No Patient Requires Transmission-Based No since last visito Precautions: Hospitalized since last visit: No Patient Has Alerts: No Has Dressing in Place as Prescribed: Yes Has Compression in Place as Yes Prescribed: Has Footwear/Offloading in Place as Yes Prescribed: Right: Surgical Shoe with Pressure Relief Insole Pain Present Now: No Electronic Signature(s) Signed: 08/07/2016 5:23:32 PM By: Alejandro MullingPinkerton, Debra Entered By: Alejandro MullingPinkerton, Debra on 08/07/2016 08:04:34 [name], [name] ([id number]) -------------------------------------------------------------------------------- Encounter Discharge Information Details [name], Loys Date of Service: 08/07/2016 8:00 AM Patient Name: C. Patient Account Number: [id number] Medical Record Treating RN: Phillis Haggisinkerton, [name] [id number] Number: Other Clinician: Date of Birth/Sex: 1945/09/20 89(70 y.o. Male) Treating [name], [name] Primary Care Physician: Physician/Extender: G Referring Physician: Tania AdeWeeks in Treatment: 4529 Encounter  Discharge Information Items Discharge Pain Level: 0 Discharge Condition: Stable Ambulatory Status: Ambulatory Discharge Destination: Home Transportation: Private Auto Accompanied By: self Schedule Follow-up Appointment: Yes Medication Reconciliation completed Yes and provided to Patient/Care Rolanda Campa: Patient Clinical Summary of Care: Declined Electronic Signature(s) Signed: 08/07/2016 9:05:02 AM By: Gwenlyn PerkingMoore, Shelia Entered By: Gwenlyn PerkingMoore, Shelia on 08/07/2016 09:05:02 [name], [name] ([id number]) -------------------------------------------------------------------------------- Lower Extremity Assessment Details [name], Jabri Date of Service: 08/07/2016 8:00 AM Patient Name: C. Patient Account Number: [id number] Medical Record Treating RN: Phillis Haggisinkerton, [name] [id number] Number: Other Clinician: Date of Birth/Sex: 1945/09/20 29(70 y.o. Male) Treating [name], [name] Primary Care Physician: Physician/Extender: G Referring Physician: Tania AdeWeeks in Treatment: 29 Edema Assessment Assessed: [Left: No] [Right: No] E[Left: dema] [Right: :] Calf Left: Right: Point of Measurement: 40 cm From Medial Instep cm 34.5 cm Ankle Left: Right: Point of Measurement: 12 cm From Medial Instep cm 23.2 cm Vascular Assessment Pulses: Posterior Tibial Dorsalis Pedis Palpable: [Right:Yes] Extremity colors, hair growth, and conditions: Extremity Color: [Right:Hyperpigmented] Temperature of Extremity: [Right:Warm] Capillary Refill: [Right:< 3 seconds] Toe Nail Assessment Left: Right: Thick: Yes Discolored: Yes Deformed: Yes Improper Length and Hygiene: Yes Electronic Signature(s) Signed: 08/07/2016 5:23:32 PM By: Alejandro MullingPinkerton, Debra Entered By: Alejandro MullingPinkerton, Debra on 08/07/2016 08:12:11 [name], [name] ([id number]) [name], [name] -------------------------------------------------------------------------------- Multi Wound Chart Details [name], [name] Date of Service: 08/07/2016  8:00 AM Patient Name: C. Patient Account Number: [id number] Medical Record Treating RN: Phillis Haggisinkerton, [name] [id number] Number: Other Clinician: Date of Birth/Sex: 1945/09/20 69(70 y.o. Male) Treating [name], [name] Primary Care Physician: Physician/Extender: G Referring Physician: Tania AdeWeeks in Treatment: 29 Vital Signs Height(in): 76 Pulse(bpm): 96 Weight(lbs): 238 Blood Pressure 144/76 (mmHg): Body Mass Index(BMI): 29 Temperature(F): 97.6 Respiratory Rate 18 (breaths/min): Photos: [1:No Photos] [2:No Photos] [N/A:N/A] Wound Location: [1:Right Malleolus - Lateral] [2:Right Malleolus - Medial] [N/A:N/A] Wounding Event: [1:Gradually Appeared] [2:Gradually Appeared] [N/A:N/A] Primary Etiology: [1:Venous Leg Ulcer] [2:Venous Leg Ulcer] [N/A:N/A] Comorbid History: [1:Cataracts, Chronic Obstructive Pulmonary Disease (COPD), Osteoarthritis] [2:Cataracts, Chronic Obstructive Pulmonary Disease (COPD), Osteoarthritis] [N/A:N/A] Date Acquired: [1:08/11/2015] [2:01/30/2016] [N/A:N/A] Weeks of  Treatment: [1:29] [2:27] [N/A:N/A] Wound Status: [1:Open] [2:Open] [N/A:N/A] Clustered Wound: [1:No] [2:Yes] [N/A:N/A] Measurements L x W x D 8x8.2x0.2 [2:4.6x3.3x0.2] [N/A:N/A] (cm) Area (cm) : [1:51.522] [2:11.922] [N/A:N/A] Volume (cm) : [1:10.304] [2:2.384] [N/A:N/A] % Reduction in Area: [1:-198.20%] [2:-3.30%] [N/A:N/A] % Reduction in Volume: -98.80% [2:-106.40%] [N/A:N/A] Classification: [1:Full Thickness Without Exposed Support Structures] [2:Full Thickness Without Exposed Support Structures] [N/A:N/A] Exudate Amount: [1:Large] [2:Large] [N/A:N/A] Exudate Type: [1:Purulent] [2:Purulent] [N/A:N/A] Exudate Color: [1:yellow, brown, green] [2:yellow, brown, green] [N/A:N/A] Wound Margin: [1:Distinct, outline attached] [2:Distinct, outline attached] [N/A:N/A] Granulation Amount: [1:Small (1-33%)] [2:Small (1-33%)] [N/A:N/A] Granulation Quality: [1:Red] [2:Red] [N/A:N/A] Necrotic Amount: [1:Large  (67-100%)] [2:Large (67-100%)] [N/A:N/A] Exposed Structures: Fascia: No Fascia: No N/A Fat: No Fat: No Tendon: No Tendon: No Muscle: No Muscle: No Joint: No Joint: No Bone: No Bone: No Limited to Skin Limited to Skin Breakdown Breakdown Epithelialization: None None N/A Periwound Skin Texture: No Abnormalities Noted No Abnormalities Noted N/A Periwound Skin Maceration: Yes Maceration: Yes N/A Moisture: Moist: Yes Moist: Yes Periwound Skin Color: No Abnormalities Noted No Abnormalities Noted N/A Temperature: No Abnormality No Abnormality N/A Tenderness on Yes Yes N/A Palpation: Wound Preparation: Ulcer Cleansing: Ulcer Cleansing: N/A Rinsed/Irrigated with Rinsed/Irrigated with Saline, Other: soap and Saline, Other: soap and water water Topical Anesthetic Topical Anesthetic Applied: Other: lidocaine Applied: Other: lidocaine 4% 4% Treatment Notes Electronic Signature(s) Signed: 08/07/2016 5:23:32 PM By: Alejandro Mulling Entered By: Alejandro Mulling on 08/07/2016 08:31:56 [name] Stack ([id number]) -------------------------------------------------------------------------------- Multi-Disciplinary Care Plan Details [name] Ringer Date of Service: 08/07/2016 8:00 AM Patient Name: C. Patient Account Number: [id number] Medical Record Treating RN: [name] [id number] Number: Other Clinician: Date of Birth/Sex: 10/29/1945 (70 y.o. Male) Treating [name] Primary Care Physician: Physician/Extender: G Referring Physician: Tania Ade in Treatment: 29 Active Inactive Venous Leg Ulcer Nursing Diagnoses: Knowledge deficit related to disease process and management Goals: Patient will maintain optimal edema control Date Initiated: 04/03/2016 Goal Status: Active Interventions: Compression as ordered Treatment Activities: Therapeutic compression applied : 04/03/2016 Notes: Wound/Skin Impairment Nursing Diagnoses: Impaired tissue  integrity Goals: Ulcer/skin breakdown will heal within 14 weeks Date Initiated: 01/17/2016 Goal Status: Active Interventions: Assess ulceration(s) every visit Notes: Electronic Signature(s) Signed: 08/07/2016 5:23:32 PM By: Aurelio Jew, [name] ([id number]) Entered By: Alejandro Mulling on 08/07/2016 08:31:36 [name] Stack ([id number]) -------------------------------------------------------------------------------- Pain Assessment Details [name] Ringer Date of Service: 08/07/2016 8:00 AM Patient Name: C. Patient Account Number: [id number] Medical Record Treating RN: [name] [id number] Number: Other Clinician: Date of Birth/Sex: 1946/04/19 (70 y.o. Male) Treating [name] Primary Care Physician: Physician/Extender: G Referring Physician: Tania Ade in Treatment: 29 Active Problems Location of Pain Severity and Description of Pain Patient Has Paino No Site Locations With Dressing Change: No Pain Management and Medication Current Pain Management: Electronic Signature(s) Signed: 08/07/2016 5:23:32 PM By: Alejandro Mulling Entered By: Alejandro Mulling on 08/07/2016 08:04:40 [name] Stack ([id number]) -------------------------------------------------------------------------------- Patient/Caregiver Education Details [name] Ringer Date of Service: 08/07/2016 8:00 AM Patient Name: C. Patient Account Number: [id number] Medical Record Treating RN: [name] [id number] Number: Other Clinician: Date of Birth/Gender: 1945/10/14 (70 y.o. Male) Treating [name], [name] Primary Care Physician: Physician/Extender: G Referring Physician: Tania Ade in Treatment: 68 Education Assessment Education Provided To: Patient Education Topics Provided Wound/Skin Impairment: Handouts: Other: do not get wrap wet Methods: Demonstration, Explain/Verbal Responses: State content correctly Electronic Signature(s) Signed: 08/07/2016 5:23:32 PM By:  Alejandro Mulling Entered By: Alejandro Mulling on 08/07/2016 09:01:03 [name] Stack ([id number]) -------------------------------------------------------------------------------- Wound Assessment Details [name] Ringer Date of Service: 08/07/2016 8:00 AM Patient Name:  C. Patient Account Number: [id number] Medical Record Treating RN: Phillis Haggisinkerton, [name] [id number] Number: Other Clinician: Date of Birth/Sex: 1946/04/27 100(70 y.o. Male) Treating [name], [name] Primary Care Physician: Physician/Extender: G Referring Physician: Tania AdeWeeks in Treatment: 29 Wound Status Wound Number: 1 Primary Venous Leg Ulcer Etiology: Wound Location: Right Malleolus - Lateral Wound Open Wounding Event: Gradually Appeared Status: Date Acquired: 08/11/2015 Comorbid Cataracts, Chronic Obstructive Weeks Of Treatment: 29 History: Pulmonary Disease (COPD), Clustered Wound: No Osteoarthritis Photos Photo Uploaded By: Alejandro MullingPinkerton, Debra on 08/07/2016 13:26:52 Wound Measurements Length: (cm) 8 Width: (cm) 8.2 Depth: (cm) 0.2 Area: (cm) 51.522 Volume: (cm) 10.304 % Reduction in Area: -198.2% % Reduction in Volume: -98.8% Epithelialization: None Tunneling: No Undermining: No Wound Description Full Thickness Without Exposed Classification: Support Structures Wound Margin: Distinct, outline attached Exudate Large Amount: Exudate Type: Purulent Exudate Color: yellow, brown, green Foul Odor After Cleansing: No Wound Bed Hem, Kaydin C. ([id number]) Granulation Amount: Small (1-33%) Exposed Structure Granulation Quality: Red Fascia Exposed: No Necrotic Amount: Large (67-100%) Fat Layer Exposed: No Necrotic Quality: Adherent Slough Tendon Exposed: No Muscle Exposed: No Joint Exposed: No Bone Exposed: No Limited to Skin Breakdown Periwound Skin Texture Texture Color No Abnormalities Noted: No No Abnormalities Noted: No Moisture Temperature / Pain No Abnormalities Noted:  No Temperature: No Abnormality Maceration: Yes Tenderness on Palpation: Yes Moist: Yes Wound Preparation Ulcer Cleansing: Rinsed/Irrigated with Saline, Other: soap and water, Topical Anesthetic Applied: Other: lidocaine 4%, Treatment Notes Wound #1 (Right, Lateral Malleolus) 1. Cleansed with: Clean wound with Normal Saline Cleanse wound with antibacterial soap and water 2. Anesthetic Topical Lidocaine 4% cream to wound bed prior to debridement 3. Peri-wound Care: Other peri-wound care (specify in notes) 4. Dressing Applied: Aquacel Ag 5. Secondary Dressing Applied ABD Pad Dry Gauze 7. Secured with Tape 3 Layer Compression System - Right Lower Extremity Notes xtrasorb, unna to anchor, TCA Electronic Signature(s) Signed: 08/07/2016 5:23:32 PM By: Alejandro MullingPinkerton, Debra Entered By: Alejandro MullingPinkerton, Debra on 08/07/2016 08:17:09 [name], Diontae C. ([id number]) -------------------------------------------------------------------------------- Wound Assessment Details [name], Rubel Date of Service: 08/07/2016 8:00 AM Patient Name: C. Patient Account Number: [id number] Medical Record Treating RN: Phillis Haggisinkerton, [name] [id number] Number: Other Clinician: Date of Birth/Sex: 1946/04/27 10(70 y.o. Male) Treating [name], [name] Primary Care Physician: Physician/Extender: G Referring Physician: Tania AdeWeeks in Treatment: 29 Wound Status Wound Number: 2 Primary Venous Leg Ulcer Etiology: Wound Location: Right Malleolus - Medial Wound Open Wounding Event: Gradually Appeared Status: Date Acquired: 01/30/2016 Comorbid Cataracts, Chronic Obstructive Weeks Of Treatment: 27 History: Pulmonary Disease (COPD), Clustered Wound: Yes Osteoarthritis Photos Photo Uploaded By: Alejandro MullingPinkerton, Debra on 08/07/2016 13:26:53 Wound Measurements Length: (cm) 4.6 Width: (cm) 3.3 Depth: (cm) 0.2 Area: (cm) 11.922 Volume: (cm) 2.384 % Reduction in Area: -3.3% % Reduction in Volume: -106.4% Epithelialization:  None Tunneling: No Undermining: No Wound Description Full Thickness Without Exposed Classification: Support Structures Wound Margin: Distinct, outline attached Exudate Large Amount: Exudate Type: Purulent Exudate Color: yellow, brown, green Foul Odor After Cleansing: No Wound Bed Rupnow, Phoenix C. ([id number]) Granulation Amount: Small (1-33%) Exposed Structure Granulation Quality: Red Fascia Exposed: No Necrotic Amount: Large (67-100%) Fat Layer Exposed: No Necrotic Quality: Adherent Slough Tendon Exposed: No Muscle Exposed: No Joint Exposed: No Bone Exposed: No Limited to Skin Breakdown Periwound Skin Texture Texture Color No Abnormalities Noted: No No Abnormalities Noted: No Moisture Temperature / Pain No Abnormalities Noted: No Temperature: No Abnormality Maceration: Yes Tenderness on Palpation: Yes Moist: Yes Wound Preparation Ulcer Cleansing: Rinsed/Irrigated with Saline, Other: soap and water, Topical Anesthetic Applied: Other: lidocaine 4%,  Treatment Notes Wound #2 (Right, Medial Malleolus) 1. Cleansed with: Clean wound with Normal Saline Cleanse wound with antibacterial soap and water 2. Anesthetic Topical Lidocaine 4% cream to wound bed prior to debridement 3. Peri-wound Care: Other peri-wound care (specify in notes) 4. Dressing Applied: Aquacel Ag 5. Secondary Dressing Applied ABD Pad Dry Gauze 7. Secured with Tape 3 Layer Compression System - Right Lower Extremity Notes xtrasorb, unna to anchor, TCA Electronic Signature(s) Signed: 08/07/2016 5:23:32 PM By: Alejandro Mulling Entered By: Alejandro Mulling on 08/07/2016 08:16:24 [name] Stack ([id number]) -------------------------------------------------------------------------------- Vitals Details [name] Ringer Date of Service: 08/07/2016 8:00 AM Patient Name: C. Patient Account Number: [id number] Medical Record Treating RN: [name] [id number] Number: Other  Clinician: Date of Birth/Sex: [birth year] (70 y.o. Male) Treating [name], [name] Primary Care Physician: Physician/Extender: G Referring Physician: Tania Ade in Treatment: 29 Vital Signs Time Taken: 08:04 Temperature (F): 97.6 Height (in): 76 Pulse (bpm): 96 Weight (lbs): 238 Respiratory Rate (breaths/min): 18 Body Mass Index (BMI): 29 Blood Pressure (mmHg): 144/76 Reference Range: 80 - 120 mg / dl Electronic Signature(s) Signed: 08/07/2016 5:23:32 PM By: Alejandro Mulling Entered By: Alejandro Mulling on 08/07/2016 08:06:56

## 2016-08-08 NOTE — Progress Notes (Addendum)
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 Currently he tells me that the pain is doing very well about a 1 out of 10 today. Yesterday was a little  bit worse but he tells me that he was more active watering his flowers that day. Overall he feels that his symptoms are doing significantly better at this point in time. His edema continues to be controlled well with the 4-layer compression wrap and he really has not noted any odor at this point in time. He is tolerating the dressing changes when they are performed well. 06/05/16 at this point in time today patient currently shows no interval signs or symptoms of local or systemic infection. Again his pain level he rates to be a 1 out of 10 at most and overall he tells me that generally this is not giving him much trouble. In fact he even feels maybe a little bit better than last week. We have continue with the 4-layer compression wrap in which she tolerates very well at this point. He is continuing to utilize the [place]. 06/12/16 I think there has been some progression in the status of both of these wounds over today again covered in a gelatinous surface. Has been using Hydrofera Blue. We had used Iodoflex in the past I'm not sure if there was an issue other than changing to something that might progress towards closure faster 06/19/16; he did not tolerate the Flexeril last week secondary to pain and this was changed on Friday back to [facility] area he continues to have copious amounts of gelatinous surface slough which is think inhibiting the speed of healing this area 06/26/16  patient over the last week has utilized the Santyl to try to loosen up some of the tightly adherent slough that was noted on evaluation last week. The good news is he tells me that the medial malleoli region really does not bother him the right llateral malleoli region is more tender to palpation at this point in time especially in the central/inferior location. However it does appear that the Santyl has done his job to loosen up the adherent slough at this point in time. Fortunately he has no interval signs or symptoms of infection locally or systemically no purulent discharge noted. 07/03/16 at this point in time today patient's wounds appear to be significantly improved over the right medial and lateral malleolus locations. He has much less tenderness at this point in time and the wounds appear clean her although there is still adherent slough this is sufficiently improved over what I saw last week. I still see no evidence of local infection. 07/10/16; continued gradual improvement in the right medial and lateral malleolus locations. The lateral is more substantial wound now divided into 2 by a rim of normal epithelialization. 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 He denies any complaints of pain or intolerance to compression. Both ulcers are smaller in size oriented today's measurements, both are covered with a softly adherent slough. [name], [name] ([id number]) 07/24/16; medial wound is smaller, lateral about the same although surface looks better. Still using Hydrofera Blue 07/31/16; arrives today complaining of pain in the lateral part of his foot. Nurse reports a lot more drainage. He has been using Hydrofera Blue. Switch to silver alginate today 08/03/2016 -- I was asked to see the patient was here for a nurse visit today. I understand he had a lot of pain in  his right lower extremity and was having blisters on his right foot which have not been there before. Though he started on doxycycline he  does not have blisters elsewhere on his body. I do not believe this is a drug allergy. also mentioned that there was a copious purulent discharge from the wound and clinically there is no evidence of cellulitis. 08/07/16; I note that the patient came in for his nurse check on Friday apparently with blisters on his toes on the right than a lot of swelling in his forefoot. He continued on the doxycycline that I had prescribed on 12/8. A culture was done of the lateral wound that showed a combination of a few Proteus and Pseudomonas. Doxycycline might of covered the Proteus but would be unlikely to cover the Pseudomonas. He is on Coumadin. He arrives in the clinic today feeling a lot better states the pain is a lot better but nothing specific really was done other than to rewrap the foot also noted that he had arterial studies ordered in August although these were never done. It is reasonable to go ahead and reorder these. Objective Constitutional Sitting or standing Blood Pressure is within target range for patient.. Pulse regular and within target range for patient.Marland Kitchen Respirations regular, non-labored and within target range.. Temperature is normal and within the target range for the patient.. Patient's appearance is neat and clean. Appears in no acute distress. Well nourished and well developed.. Vitals Time Taken: 8:04 AM, Height: 76 in, Weight: 238 lbs, BMI: 29, Temperature: 97.6 F, Pulse: 96 bpm, Respiratory Rate: 18 breaths/min, Blood Pressure: 144/76 mmHg. Cardiovascular Popliteal pulses are strong.. Pedal pulses palpable and strong bilaterally.. No edema.. No blisters although I see where the blisters were on the dorsal aspect of his foot. He has previous skin grafting. General Notes: Wound exam; not much change this week. Copious amounts of surface  slough and nonviable subcutaneous tissue over the surface of the wound. Debridement with a open curet. Really no evidence of surrounding infection. No drainage no tenderness. No involvement of his ankle. Integumentary (Hair, Skin) Wound #1 status is Open. Original cause of wound was Gradually Appeared. The wound is located on the Right,Lateral Malleolus. The wound measures 8cm length x 8.2cm width x 0.2cm depth; 51.522cm^2 area and 10.304cm^3 volume. The wound is limited to skin breakdown. There is no tunneling or undermining noted. There is a large amount of purulent drainage noted. The wound margin is distinct with the outline attached to the wound base. There is small (1-33%) red granulation within the wound bed. There is a large [name], [name] ([id number]) (67-100%) amount of necrotic tissue within the wound bed including Adherent Slough. The periwound skin appearance exhibited: Maceration, Moist. Periwound temperature was noted as No Abnormality. The periwound has tenderness on palpation. Wound #2 status is Open. Original cause of wound was Gradually Appeared. The wound is located on the Right,Medial Malleolus. The wound measures 4.6cm length x 3.3cm width x 0.2cm depth; 11.922cm^2 area and 2.384cm^3 volume. The wound is limited to skin breakdown. There is no tunneling or undermining noted. There is a large amount of purulent drainage noted. The wound margin is distinct with the outline attached to the wound base. There is small (1-33%) red granulation within the wound bed. There is a large (67-100%) amount of necrotic tissue within the wound bed including Adherent Slough. The periwound skin appearance exhibited: Maceration, Moist. Periwound temperature was noted as No Abnormality. The periwound has tenderness on palpation. 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 There was a Skin/Subcutaneous Tissue Debridement ([id number]) debridement with total area of 65.6 sq cm performed by [name] Caul, [name]. with the following instrument(s): scoop to remove Viable and Non-Viable tissue/material including Exudate, Fibrin/Slough, and Subcutaneous after achieving pain control using Lidocaine 4% Topical Solution. A time out was conducted at 08:33, prior to the start of the procedure. A Minimum amount of bleeding was controlled with Pressure. The procedure was tolerated well with a pain level of 0 throughout and a pain level of 0 following the procedure. Post Debridement Measurements: 8cm length x 8.2cm width x 0.2cm depth; 10.304cm^3 volume. Character of Wound/Ulcer Post Debridement requires further debridement. Severity of Tissue Post Debridement is: Fat layer exposed. Post procedure Diagnosis Wound #1: Same as Pre-Procedure Wound #2 Wound #2 is a Venous Leg Ulcer located on the Right,Medial Malleolus . There was a Skin/Subcutaneous Tissue Debridement ([id number]) debridement with total area of 15.18 sq cm performed by Rosalio Macadamia, [name] ([id number]) Venetia Night, [name]. with the following instrument(s): scoop to remove Viable and Non-Viable tissue/material including Exudate, Fibrin/Slough, and Subcutaneous after achieving pain control using Lidocaine 4% Topical Solution. A time out was conducted at 08:33, prior to the start of the procedure. A Minimum amount of bleeding was controlled with Pressure. The procedure was tolerated well with a pain level of 0 throughout and a pain level of 0 following the procedure. Post Debridement  Measurements: 4.6cm length x 3.3cm width x 0.2cm depth; 2.384cm^3 volume. Character of Wound/Ulcer Post Debridement requires further debridement. Severity of Tissue Post Debridement is: Fat layer exposed. Post procedure Diagnosis Wound #2: Same as Pre-Procedure Plan Wound Cleansing: Wound #1 Right,Lateral Malleolus: Cleanse wound with mild soap and water May shower with protection. No tub bath. Wound #2 Right,Medial Malleolus: Cleanse wound with mild soap and water May shower with protection. No tub bath. Skin Barriers/Peri-Wound Care: Wound #1 Right,Lateral Malleolus: Barrier cream - zinc oxide Triamcinolone Acetonide Ointment Wound #2 Right,Medial Malleolus: Barrier cream - zinc oxide Triamcinolone Acetonide Ointment Primary Wound Dressing: Wound #1 Right,Lateral Malleolus: Aquacel Ag Wound #2 Right,Medial Malleolus: Aquacel Ag Secondary Dressing: Wound #1 Right,Lateral Malleolus: ABD pad Dry Gauze XtraSorb Wound #2 Right,Medial Malleolus: ABD pad Dry Gauze XtraSorb Follow-up Appointments: Wound #1 Right,Lateral Malleolus: Return Appointment in 1 week. [name], [name] ([id number]) Edema Control: Wound #1 Right,Lateral Malleolus: 3 Layer Compression System - Right Lower Extremity - UNNA TO ANCHOR Wound #2 Right,Medial Malleolus: 3 Layer Compression System - Right Lower Extremity - UNNA TO ANCHOR Additional Orders / Instructions: Wound #1 Right,Lateral Malleolus: Increase protein intake. OK to return to work with the following restrictions: Activity as tolerated Wound #2 Right,Medial Malleolus: Increase protein intake. OK to return to work with the following restrictions: Activity as tolerated Medications-please add to medication list.: Wound #1 Right,Lateral Malleolus: P.O. Antibiotics - Cefidinir Other: - Vitamin C, Zinc, Multivitamin Wound #2 Right,Medial Malleolus: Other: - Vitamin C, Zinc, Multivitamin Services and Therapies ordered were: Arterial  Studies- Bilateral - Dr. Kirke Corin #1 I'm not completely convinced this was an infection, the doxycycline should not of covered Pseudomonas. Nevertheless the patient states his foot is a lot better and it looks a lot better. I wonder whether this was a wrap issue. I have given him 7 days of  cefdinir. He is on Coumadin we'll check his PT/INR INR on Friday #2 it is reasonable to go ahead and order arterial studies on this man who has deep wounds especially on the lateral aspect of the leg. His dorsalis pedis and posterior tibial pulses as well as his popliteal pulses however are easily palpable. #3 continue the silver alginate based dressings although we may need to change this next week as there continues to be a lot of necrotic surface tissue Electronic Signature(s) Signed: 08/13/2016 4:58:12 PM By: Elliot Gurney RN, BSN, Kim RN, BSN Signed: 08/15/2016 8:17:12 AM By: [name] Najjar [name] Previous Signature: 08/07/2016 5:30:41 PM Version By: [name] Najjar [name] Entered By: Elliot Gurney RN, BSN, Kim on 08/13/2016 16:58:12 [name], [name] ([id number]) -------------------------------------------------------------------------------- SuperBill Details [name] Ringer Date of Service: 08/07/2016 Patient Name: C. Patient Account Number: [id number] Medical Record Treating RN: [name] Haggis [id number] Number: Other Clinician: Date of Birth/Sex: 09/18/1945 (70 y.o. 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, initial T85.613A encounter Facility  Procedures CPT4 Code: [id number] Description: 11042 - DEB SUBQ TISSUE 20 SQ CM/< ICD-10 Description Diagnosis L97.212 Non-pressure chronic ulcer of right calf with fat la Modifier: yer exposed Quantity: 1 CPT4 Code: [id number] Description: 11045 - DEB SUBQ TISS EA ADDL 20CM ICD-10 Description Diagnosis L97.212 Non-pressure chronic ulcer of right calf with fat la Modifier: yer exposed Quantity: 4 Physician Procedures CPT4 Code: [id number] Description: 11042 - WC PHYS SUBQ TISS 20 SQ CM ICD-10 Description Diagnosis L97.212 Non-pressure chronic ulcer of right calf with fat la Modifier: yer exposed Quantity: 1 CPT4 Code: [id number] [place]ne[place], L Description: 11045 - WC PHYS SUBQ TISS EA ADDL 20 CM ICD-10 Description Diagnosis L97.212 Non-pressure chronic ulcer of right calf with fat la [name] ([id number]) Modifier: yer exposed Quantity: 4 Electronic Signature(s) Signed: 08/07/2016 5:30:41 PM By: [name] Najjar [name] Entered By: [name] Najjar on 08/07/2016 08:49:11

## 2016-08-08 NOTE — Telephone Encounter (Signed)
l mom to inform pt of LE u/s. Per dr Leanord [facility]kingobson at [facility] . Also pt has NP appt with DR. Kirke CorinArida

## 2016-08-10 ENCOUNTER — Other Ambulatory Visit: Payer: Self-pay | Admitting: Internal Medicine

## 2016-08-10 DIAGNOSIS — S91301A Unspecified open wound, right foot, initial encounter: Secondary | ICD-10-CM

## 2016-08-10 DIAGNOSIS — I87331 Chronic venous hypertension (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: Secondary | ICD-10-CM | POA: Diagnosis not present

## 2016-08-11 NOTE — Progress Notes (Addendum)
[name], Serafin C. ([id number]) Visit Report for 08/10/2016 Arrival Information Details Patient Name: [name], Micael C. Date of Service: 08/10/2016 1:30 PM Medical Record Number: [id number] Patient Account Number: [id number] Date of Birth/Sex: 1946-08-09 67(70 y.o. Male) Treating RN: Clover MealyAfful, RN, BSN, [place] Phys[place] Other Clinician: Referring Physician: Treating Physician/Extender: Rudene ReBritto, Errol Weeks in Treatment: 29 Visit Information History Since Last Visit All ordered tests and consults were completed: No Patient Arrived: Ambulatory Added or deleted any medications: No Arrival Time: 13:29 Any new allergies or adverse reactions: No Accompanied By: self Had a fall or experienced change in No Transfer Assistance: None activities of daily living that may affect Patient Identification Verified: Yes risk of falls: Secondary Verification Process Yes Hospitalized since last visit: No Completed: Has Dressing in Place as Prescribed: Yes Patient Requires Transmission-Based No Has Compression in Place as Prescribed: Yes Precautions: Pain Present Now: No Patient Has Alerts: No Electronic Signature(s) Signed: 08/10/2016 1:29:47 PM By: Elpidio EricAfful, Rita BSN, RN Entered By: Elpidio EricAfful, Rita on 08/10/2016 13:29:47 [name], Sander C. ([id number]) -------------------------------------------------------------------------------- Encounter Discharge Information Details Patient Name: [name], Giannis C. Date of Service: 08/10/2016 1:30 PM Medical Record Number: [id number] Patient Account Number: [id number] Date of Birth/Sex: 1946-08-09 27(70 y.o. Male) Treating RN: Clover MealyAfful, RN, BSN,  [place] Phys[place] Other Clinician: Referring Physician: Treating Physician/Extender: Rudene ReBritto, Errol Weeks in Treatment: 3529 Encounter Discharge Information Items Discharge Pain Level: 0 Discharge Condition: Stable Ambulatory Status: Ambulatory Discharge Destination:  Home Private Transportation: Auto Accompanied By: self Schedule Follow-up Appointment: No Medication Reconciliation completed and No provided to Patient/Care Zaya Kessenich: Clinical Summary of Care: Electronic Signature(s) Signed: 08/10/2016 3:28:14 PM By: Elpidio EricAfful, Rita BSN, RN Entered By: Elpidio EricAfful, Rita on 08/10/2016 15:28:14 [name], Fred C. ([id number]) -------------------------------------------------------------------------------- Patient/Caregiver Education Details Patient Name: [name], Zhion C. Date of Service: 08/10/2016 1:30 PM Medical Record Number: [id number] Patient Account Number: [id number] Date of Birth/Gender: 1946-08-09 [age] y.o. Male) Treating RN: Clover MealyAfful, RN, BSN, [employer] [place] Other Clinician: Referring Physician: Treating Physician/Extender: Rudene ReBritto, Errol Weeks in Treatment: 6629 Education Assessment Education Provided To: Patient Education Topics Provided Psychologist, prison and probation serviceslectronic Signature(s) Signed: 08/10/2016 3:49:14 PM By: Elpidio EricAfful, Rita BSN, RN Entered By: Elpidio EricAfful, Rita on 08/10/2016 15:27:56

## 2016-08-14 ENCOUNTER — Encounter: Payer: Medicare HMO | Admitting: Internal Medicine

## 2016-08-14 DIAGNOSIS — I87331 Chronic venous hypertension (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: Secondary | ICD-10-CM | POA: Diagnosis not present

## 2016-08-15 NOTE — Progress Notes (Addendum)
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 especially in the medial right leg wound. Area on the lateral which is more substantial also looks better still requires debridement 05/22/16; we have been using Hydrofera Blue. 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 Currently he tells me that the pain is doing very well about a 1 out of 10 today. Yesterday was a little bit worse but he tells me that he was more active watering his flowers that day. Overall he feels that his symptoms are doing significantly better at this point in time. His edema continues to be controlled well with the 4-layer compression wrap and he really has not noted any odor at this point in time. He is tolerating the dressing changes when they are performed well. 06/05/16 at this point in time today patient currently shows no interval signs or symptoms of local or systemic infection. Again his  pain level he rates to be a 1 out of 10 at most and overall he tells me that generally this is not giving him much trouble. In fact he even feels maybe a little bit better than last week. We have continue with the 4-layer compression wrap in which she tolerates very well at this point. He is continuing to utilize the [place]. 06/12/16 I think there has been some progression in the status of both of these wounds over today again covered in a gelatinous surface. Has been using Hydrofera Blue. We had used Iodoflex in the past I'm not sure if there was an issue other than changing to something that might progress towards closure faster 06/19/16; he did not tolerate the Flexeril last week secondary to pain and this was changed on Friday back to [name] area he continues to have copious amounts of gelatinous surface slough which is think inhibiting the speed of healing this area 06/26/16 patient over the last week has utilized the Santyl to try to loosen up some of the tightly adherent slough that was noted on evaluation last week. The good news is he tells me that the medial malleoli region really does not bother him the right llateral malleoli region is more tender to palpation at this point in time especially in the central/inferior location. However it does appear that the Santyl has done his job to loosen up the adherent slough at this point in time. Fortunately he has no interval signs or symptoms of infection locally or systemically no purulent discharge noted. 07/03/16 at this point in time today patient's wounds appear to be significantly improved over the right medial and lateral malleolus locations. He has much less tenderness at this point in time and the wounds appear clean her although there is still adherent slough this is sufficiently improved over what I saw last week. I still see no evidence of local infection. 07/10/16; continued gradual improvement in the right  medial and lateral malleolus locations. The lateral is more substantial wound now  divided into 2 by a rim of normal epithelialization. 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 He denies any complaints of pain or intolerance to compression. Both ulcers are smaller in size oriented today's measurements, both are covered with a softly adherent slough. [name], [name] ([id number]) 07/24/16; medial wound is smaller, lateral about the same although surface looks better. Still using Hydrofera Blue 07/31/16; arrives today complaining of pain in the lateral part of his foot. Nurse reports a lot more drainage. He has been using Hydrofera Blue. Switch to silver alginate today 08/03/2016 -- I was asked to see the patient was here for a nurse visit today. I understand he had a lot of pain in his right lower extremity and was having blisters on his right foot which have not been there before. Though he started on doxycycline he does not have blisters elsewhere on his body. I do not believe this is a drug allergy. also mentioned that there was a copious purulent discharge from the wound and clinically there is no evidence of cellulitis. 08/07/16; I note that the patient came in for his nurse check on Friday apparently with blisters on his toes on the right than a lot of swelling in his forefoot. He continued on the doxycycline that I had prescribed on 12/8. A culture was done of the lateral wound that showed a combination of a few Proteus and Pseudomonas. Doxycycline might of covered the Proteus but would be unlikely to cover the Pseudomonas. He is on Coumadin. He arrives in the clinic today feeling a lot better states the pain is a lot better but nothing specific really was done other than to rewrap the foot also noted that he had arterial studies ordered in August although these were  never done. It is reasonable to go ahead and reorder these. 08/14/16; generally arrives in a better state today in terms of the wounds he has taken cefdinir for one week. Our intake nurse reports copious amounts of drainage but the patient is complaining of much less pain. He is not had his PT and INR checked and I've asked him to do this today or tomorrow. Objective Constitutional Sitting or standing Blood Pressure is within target range for patient.. Pulse regular and within target range for patient.Marland Kitchen Respirations regular, non-labored and within target range.. Temperature is normal and within the target range for the patient.. Patient's appearance is neat and clean. Appears in no acute distress. Well nourished and well developed.. Vitals Time Taken: 8:06 AM, Height: 76 in, Weight: 238 lbs, BMI: 29, Temperature: 97.7 F, Pulse: 74 bpm, Respiratory Rate: 18 breaths/min, Blood Pressure: 139/67 mmHg. Eyes Conjunctivae clear. No discharge.Marland Kitchen Respiratory Respiratory effort is easy and symmetric bilaterally. Rate is normal at rest and on room air.. Cardiovascular Pedal pulses palpable and strong bilaterally.. No edema. Previous plastic surgery in the area. Open area medially has been there for a long time.Marland Kitchen Lymphatic None palpable in the popliteal or inguinal area. [name], [name] ([id number]) Psychiatric No evidence of depression, anxiety, or agitation. Calm, cooperative, and communicative. Appropriate interactions and affect.. General Notes: Wound exam; surface of the wound looks better but not much change in dimensions. Still requires debridement with an open curette surface slough bioburden nonviable subcutaneous tissue. Circumference does not show evidence of cellulitis this part of it is much better than 2 weeks ago although he apparently is still having copious green drainage Integumentary (  Hair, Skin) Wound #1 status is Open. Original cause of wound was Gradually Appeared. The  wound is located on the Right,Lateral Malleolus. The wound measures 7.1cm length x 10cm width x 0.2cm depth; 55.763cm^2 area and 11.153cm^3 volume. The wound is limited to skin breakdown. There is no tunneling or undermining noted. There is a large amount of purulent drainage noted. The wound margin is distinct with the outline attached to the wound base. There is medium (34-66%) red granulation within the wound bed. There is a medium (34-66%) amount of necrotic tissue within the wound bed including Adherent Slough. The periwound skin appearance exhibited: Maceration, Moist. Periwound temperature was noted as No Abnormality. The periwound has tenderness on palpation. Wound #2 status is Open. Original cause of wound was Gradually Appeared. The wound is located on the Right,Medial Malleolus. The wound measures 3.9cm length x 2.9cm width x 0.2cm depth; 8.883cm^2 area and 1.777cm^3 volume. The wound is limited to skin breakdown. There is no tunneling or undermining noted. There is a large amount of purulent drainage noted. The wound margin is distinct with the outline attached to the wound base. There is medium (34-66%) red granulation within the wound bed. There is a medium (34-66%) amount of necrotic tissue within the wound bed including Adherent Slough. The periwound skin appearance exhibited: Maceration, Moist. Periwound temperature was noted as No Abnormality. The periwound has tenderness on palpation. 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 ([id number]) Wound #1 Wound #1 is a Venous Leg Ulcer located on the  Right,Lateral Malleolus . There was a Skin/Subcutaneous Tissue Debridement ([id number](11042-11047) debridement with total area of 71 sq cm performed by [name] CaulOBSON, [name] G, MD. with the following instrument(s): scoop to remove Viable and Non-Viable tissue/material including Exudate, Fibrin/Slough, and Subcutaneous after achieving pain control using Lidocaine 4% Topical Solution. A time out was conducted at 08:28, prior to the start of the procedure. A Minimum amount of bleeding was controlled with Pressure. The procedure was tolerated well with a pain level of 2 throughout and a pain level of 2 following the procedure. Post Debridement Measurements: 7.1cm length x 10cm width x 0.2cm depth; 11.153cm^3 volume. Character of Wound/Ulcer Post Debridement requires further debridement. Severity of Tissue Post Debridement is: Fat layer exposed. Post procedure Diagnosis Wound #1: Same as Pre-Procedure Wound #2 Wound #2 is a Venous Leg Ulcer located on the Right,Medial Malleolus . There was a Skin/Subcutaneous Tissue Debridement ([id number]([id number]) debridement with total area of 11.31 sq cm performed by [name] CaulOBSON, [name] G, MD. with the following instrument(s): scoop to remove Viable and Non-Viable tissue/material including Exudate, Fibrin/Slough, and Subcutaneous after achieving pain control using Lidocaine 4% Topical Solution. A time out was conducted at 08:28, prior to the start of the procedure. A Minimum amount of bleeding was controlled with Pressure. The procedure was tolerated well with a pain level of 2 throughout and a pain level of 2 following the procedure. Post Debridement Measurements: 3.9cm length x 2.9cm width x 0.2cm depth; 1.777cm^3 volume. Character of Wound/Ulcer Post Debridement requires further debridement. Severity of Tissue Post Debridement is: Fat layer exposed. Post procedure Diagnosis Wound #2: Same as Pre-Procedure Plan Wound Cleansing: Wound #1 Right,Lateral Malleolus: Cleanse wound with  mild soap and  water May shower with protection. No tub bath. Wound #2 Right,Medial Malleolus: Cleanse wound with mild soap and water May shower with protection. No tub bath. Skin Barriers/Peri-Wound Care: Wound #1 Right,Lateral Malleolus: Barrier cream - zinc oxide Triamcinolone Acetonide Ointment Wound #2 Right,Medial Malleolus: Barrier cream - zinc oxide [name], [name] ([id number]) Triamcinolone Acetonide Ointment Primary Wound Dressing: Wound #1 Right,Lateral Malleolus: Aquacel Ag Wound #2 Right,Medial Malleolus: Aquacel Ag Secondary Dressing: Wound #1 Right,Lateral Malleolus: ABD pad Dry Gauze Wound #2 Right,Medial Malleolus: ABD pad Dry Gauze Follow-up Appointments: Wound #1 Right,Lateral Malleolus: Return Appointment in 1 week. Other: - nurse visit Friday Wound #2 Right,Medial Malleolus: Return Appointment in 1 week. Edema Control: Wound #1 Right,Lateral Malleolus: 3 Layer Compression System - Right Lower Extremity - UNNA TO ANCHOR Elevate legs to the level of the heart and pump ankles as often as possible Wound #2 Right,Medial Malleolus: 3 Layer Compression System - Right Lower Extremity - UNNA TO ANCHOR Elevate legs to the level of the heart and pump ankles as often as possible Additional Orders / Instructions: Wound #1 Right,Lateral Malleolus: Increase protein intake. OK to return to work with the following restrictions: Activity as tolerated Wound #2 Right,Medial Malleolus: Increase protein intake. OK to return to work with the following restrictions: Activity as tolerated Medications-please add to medication list.: Wound #1 Right,Lateral Malleolus: P.O. Antibiotics - Cefidinir Other: - Vitamin C, Zinc, Multivitamin Wound #2 Right,Medial Malleolus: Other: - Vitamin C, Zinc, Multivitamin continue silver alginate another week of cefdinir 300 bid for 7 days LELAN, CUSH ([id number]) I have emphasized the need for PT/INR Electronic  Signature(s) Signed: 08/17/2016 8:41:19 AM By: Elliot Gurney, RN, BSN, Kim RN, BSN Signed: 08/21/2016 3:42:59 PM By: [name] Najjar MD Previous Signature: 08/15/2016 11:47:17 AM Version By: Elliot Gurney RN, BSN, Kim RN, BSN Previous Signature: 08/15/2016 5:40:41 PM Version By: [name] Najjar MD Previous Signature: 08/14/2016 5:07:10 PM Version By: [name] Najjar MD Entered By: Elliot Gurney RN, BSN, Kim on 08/17/2016 08:37:45 Nikolas, Casher [name] ([id number]) -------------------------------------------------------------------------------- SuperBill Details [name] Date of Service: 08/14/2016 Patient Name: C. Patient Account Number: [id number] Medical Record Treating RN: [name] [id number] Number: Other Clinician: Date of Birth/Sex: 25-Feb-1946 (70 y.o. 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, initial T85.613A encounter Facility Procedures CPT4: Description Modifier Quantity Code [id number] 11042 - DEB SUBQ TISSUE 20 SQ CM/< 1 ICD-10 Description Diagnosis I87.331 Chronic venous hypertension (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 L97.212 Non-pressure chronic ulcer  of right calf with fat layer exposed CPT4: [id number] 11045 - DEB SUBQ TISS EA ADDL 20CM 4 ICD-10 Description Diagnosis L97.212 Non-pressure chronic ulcer of right calf with fat layer exposed I87.331 Chronic venous hypertension (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

## 2016-08-15 NOTE — Progress Notes (Signed)
[name], [name] ([id number]) Visit Report for 08/14/2016 Arrival Information Details [name], [name] Date of Service: 08/14/2016 8:00 AM Patient Name: C. Patient Account Number: [id number] Medical Record Treating RN: Phillis Haggisinkerton, [name] [id number] Number: Other Clinician: Date of Birth/Sex: 03/19/1946 [age] y.o. Male) Treating Baltazar NajjarOBSON, [name] [place] Physician/Extender: G Referring Physician: Tania AdeWeeks in Treatment: 30 Visit Information History Since Last Visit All ordered tests and consults were completed: No Patient Arrived: Ambulatory Added or deleted any medications: No Arrival Time: 08:03 Any new allergies or adverse reactions: No Accompanied By: self Had a fall or experienced change in No Transfer Assistance: None activities of daily living that may affect Patient Requires Transmission-Based No risk of falls: Precautions: Signs or symptoms of abuse/neglect since last No Patient Has Alerts: No visito Hospitalized since last visit: No Has Dressing in Place as Prescribed: Yes Has Compression in Place as Prescribed: Yes Pain Present Now: Yes Electronic Signature(s) Signed: 08/14/2016 4:44:26 PM By: Alejandro MullingPinkerton, Debra Entered By: Alejandro MullingPinkerton, Debra on 08/14/2016 08:06:20 [name], [name] ([id number]) -------------------------------------------------------------------------------- Encounter Discharge Information Details [name], [name] Date of Service: 08/14/2016 8:00 AM Patient Name: C. Patient Account Number: [id number] Medical Record Treating RN: Phillis Haggisinkerton, [name] [id number] Number: Other Clinician: Date of Birth/Sex: 03/19/1946 [age] y.o. Male) Treating Baltazar NajjarOBSON, [name] [place] Physician/Extender: G Referring Physician: Tania AdeWeeks in Treatment: 30 Encounter Discharge Information Items Discharge Pain Level: 0 Discharge Condition: Stable Ambulatory Status: Ambulatory Discharge Destination: Home Transportation:  Private Auto Accompanied By: self Schedule Follow-up Appointment: Yes Medication Reconciliation completed Yes and provided to Patient/Care [name]: Patient Clinical Summary of Care: Declined Electronic Signature(s) Signed: 08/14/2016 8:55:03 AM By: Gwenlyn PerkingMoore, Shelia Entered By: Gwenlyn PerkingMoore, Shelia on 08/14/2016 08:55:03 [name], [name] ([id number]) -------------------------------------------------------------------------------- Lower Extremity Assessment Details [name], [name] Date of Service: 08/14/2016 8:00 AM Patient Name: C. Patient Account Number: [id number] Medical Record Treating RN: Phillis Haggisinkerton, [name] [id number] Number: Other Clinician: Date of Birth/Sex: 03/19/1946 [age] y.o. Male) Treating [name], [name] [place] Physician/Extender: G Referring Physician: Tania AdeWeeks in Treatment: 30 Edema Assessment Assessed: [Left: No] [Right: No] E[Left: dema] [Right: :] Calf Left: Right: Point of Measurement: 40 cm From Medial Instep cm 33.8 cm Ankle Left: Right: Point of Measurement: 12 cm From Medial Instep cm 23.2 cm Vascular Assessment Pulses: Dorsalis Pedis Palpable: [Right:Yes] Posterior Tibial Popliteal Palpable: [Right:Yes] Extremity colors, hair growth, and conditions: Extremity Color: [Right:Hyperpigmented] Temperature of Extremity: [Right:Warm] Capillary Refill: [Right:< 3 seconds] Toe Nail Assessment Left: Right: Thick: Yes Discolored: Yes Deformed: Yes Improper Length and Hygiene: Yes Electronic Signature(s) Signed: 08/14/2016 4:44:26 PM By: Aurelio JewPinkerton, Debra Ritchey, [name] ([id number]) Entered By: Alejandro MullingPinkerton, Debra on 08/14/2016 08:14:01 [name], [name] ([id number]) -------------------------------------------------------------------------------- Multi Wound Chart Details [name], [name] Date of Service: 08/14/2016 8:00 AM Patient Name: C. Patient Account Number: [id number] Medical Record Treating RN: Phillis Haggisinkerton,  [name] [id number] Number: Other Clinician: Date of Birth/Sex: 03/19/1946 [age] y.o. Male) Treating Baltazar NajjarOBSON, [name] [place] Physician/Extender: G Referring Physician: Tania AdeWeeks in Treatment: 30 Vital Signs Height(in): 76 Pulse(bpm): 74 Weight(lbs): 238 Blood Pressure 139/67 (mmHg): Body Mass Index(BMI): 29 Temperature(F): 97.7 Respiratory Rate 18 (breaths/min): Photos: [1:No Photos] [2:No Photos] [N/A:N/A] Wound Location: [1:Right Malleolus - Lateral] [2:Right Malleolus - Medial] [N/A:N/A] Wounding Event: [1:Gradually Appeared] [2:Gradually Appeared] [N/A:N/A] Primary Etiology: [1:Venous Leg Ulcer] [2:Venous Leg Ulcer] [N/A:N/A] Comorbid History: [1:Cataracts, Chronic Obstructive Pulmonary Disease (COPD), Osteoarthritis] [2:Cataracts, Chronic Obstructive Pulmonary Disease (COPD), Osteoarthritis] [N/A:N/A] Date Acquired: [1:08/11/2015] [2:01/30/2016] [N/A:N/A] Weeks of Treatment: [1:30] [2:28] [N/A:N/A] Wound Status: [1:Open] [2:Open] [N/A:N/A] Clustered Wound: [1:No] [  2:Yes] [N/A:N/A] Measurements L x W x D 7.1x10x0.2 [2:3.9x2.9x0.2] [N/A:N/A] (cm) Area (cm) : [1:55.763] [2:8.883] [N/A:N/A] Volume (cm) : [1:11.153] [2:1.777] [N/A:N/A] % Reduction in Area: [1:-222.70%] [2:23.10%] [N/A:N/A] % Reduction in Volume: -115.10% [2:-53.90%] [N/A:N/A] Classification: [1:Full Thickness Without Exposed Support Structures] [2:Full Thickness Without Exposed Support Structures] [N/A:N/A] Exudate Amount: [1:Large] [2:Large] [N/A:N/A] Exudate Type: [1:Purulent] [2:Purulent] [N/A:N/A] Exudate Color: [1:yellow, brown, green] [2:yellow, brown, green] [N/A:N/A] Wound Margin: [1:Distinct, outline attached] [2:Distinct, outline attached] [N/A:N/A] Granulation Amount: [1:Medium (34-66%)] [2:Medium (34-66%)] [N/A:N/A] Granulation Quality: [1:Red] [2:Red] [N/A:N/A] Necrotic Amount: [1:Medium (34-66%)] [2:Medium (34-66%)] [N/A:N/A] Exposed Structures: Fascia: No Fascia: No  N/A Fat: No Fat: No Tendon: No Tendon: No Muscle: No Muscle: No Joint: No Joint: No Bone: No Bone: No Limited to Skin Limited to Skin Breakdown Breakdown Epithelialization: None None N/A Periwound Skin Texture: No Abnormalities Noted No Abnormalities Noted N/A Periwound Skin Maceration: Yes Maceration: Yes N/A Moisture: Moist: Yes Moist: Yes Periwound Skin Color: No Abnormalities Noted No Abnormalities Noted N/A Temperature: No Abnormality No Abnormality N/A Tenderness on Yes Yes N/A Palpation: Wound Preparation: Ulcer Cleansing: Ulcer Cleansing: N/A Rinsed/Irrigated with Rinsed/Irrigated with Saline, Other: soap and Saline, Other: soap and water water Topical Anesthetic Topical Anesthetic Applied: Other: lidocaine Applied: Other: lidocaine 4% 4% Treatment Notes Electronic Signature(s) Signed: 08/14/2016 4:44:26 PM By: Alejandro Mulling Entered By: Alejandro Mulling on 08/14/2016 08:22:18 [name] Stack ([id number]) -------------------------------------------------------------------------------- Multi-Disciplinary Care Plan Details [name] Ringer Date of Service: 08/14/2016 8:00 AM Patient Name: C. Patient Account Number: [id number] Medical Record Treating RN: [name] [id number] Number: Other Clinician: Date of Birth/Sex: 22-Apr-1946 (70 y.o. Male) Treating [name] Primary Care Physician: [facility] [place] Physician/Extender: G Referring Physician: Tania [name] in Treatment: 30 Active Inactive Venous Leg Ulcer Nursing Diagnoses: Knowledge deficit related to disease process and management Goals: Patient will maintain optimal edema control Date Initiated: 04/03/2016 Goal Status: Active Interventions: Compression as ordered Treatment Activities: Therapeutic compression applied : 04/03/2016 Notes: Wound/Skin Impairment Nursing Diagnoses: Impaired tissue integrity Goals: Ulcer/skin breakdown will heal within 14 weeks Date Initiated: 01/17/2016 Goal  Status: Active Interventions: Assess ulceration(s) every visit Notes: Electronic Signature(s) Signed: 08/14/2016 4:44:26 PM By: Aurelio Jew, [name] Severance ([id number]) Entered By: Alejandro Mulling on 08/14/2016 08:22:05 [name] Stack ([id number]) -------------------------------------------------------------------------------- Pain Assessment Details [name] Ringer Date of Service: 08/14/2016 8:00 AM Patient Name: C. Patient Account Number: [id number] Medical Record Treating RN: [name] [id number] Number: Other Clinician: Date of Birth/Sex: 09-09-1945 (70 y.o. Male) Treating [name] Primary Care Physician: [facility] [place] Physician/Extender: G Referring Physician: Tania [name] in Treatment: 30 Active Problems Location of Pain Severity and Description of Pain Patient Has Paino Yes Site Locations Pain Location: Pain in Ulcers With Dressing Change: Yes Rate the pain. Current Pain Level: 2 Character of Pain Describe the Pain: Burning Pain Management and Medication Current Pain Management: Electronic Signature(s) Signed: 08/14/2016 4:44:26 PM By: Alejandro Mulling Entered By: Alejandro Mulling on 08/14/2016 08:06:39 [name] Stack ([id number]) -------------------------------------------------------------------------------- Patient/Caregiver Education Details [name] Ringer Date of Service: 08/14/2016 8:00 AM Patient Name: C. Patient Account Number: [id number] Medical Record Treating RN: [name] [id number] Number: Other Clinician: Date of Birth/Gender: 1945-12-14 (70 y.o. Male) Treating [name] Primary Care Physician: [facility] [place] Physician/Extender: G Referring Physician: Tania [name] in Treatment: 30 Education Assessment Education Provided To: Patient Education Topics Provided Wound/Skin Impairment: Handouts: Other: change dressing as ordered and do not get dressing wet Methods: Demonstration, Explain/Verbal Responses: State  content correctly Electronic Signature(s) Signed: 08/14/2016 4:44:26 PM By: Alejandro Mulling Entered By: Alejandro Mulling on 08/14/2016 08:26:33  [name], [name] ([id number]) -------------------------------------------------------------------------------- Wound Assessment Details [name] Ringer Date of Service: 08/14/2016 8:00 AM Patient Name: C. Patient Account Number: [id number] Medical Record Treating RN: [name] [id number] Number: Other Clinician: Date of Birth/Sex: 15-May-1946 (70 y.o. Male) Treating [name] Primary Care Physician: [facility] [place] Physician/Extender: G Referring Physician: Tania [name] in Treatment: 30 Wound Status Wound Number: 1 Primary Venous Leg Ulcer Etiology: Wound Location: Right Malleolus - Lateral Wound Open Wounding Event: Gradually Appeared Status: Date Acquired: 08/11/2015 Comorbid Cataracts, Chronic Obstructive Weeks Of Treatment: 30 History: Pulmonary Disease (COPD), Clustered Wound: No Osteoarthritis Photos Photo Uploaded By: Alejandro Mulling on 08/14/2016 08:25:42 Wound Measurements Length: (cm) 7.1 Width: (cm) 10 Depth: (cm) 0.2 Area: (cm) 55.763 Volume: (cm) 11.153 % Reduction in Area: -222.7% % Reduction in Volume: -115.1% Epithelialization: None Tunneling: No Undermining: No Wound Description Full Thickness Without Exposed Classification: Support Structures Wound Margin: Distinct, outline attached Exudate Large Amount: Exudate Type: Purulent Exudate Color: yellow, brown, green Foul Odor After Cleansing: No Wound Bed Buttermore, [name] ([id number]) Granulation Amount: Medium (34-66%) Exposed Structure Granulation Quality: Red Fascia Exposed: No Necrotic Amount: Medium (34-66%) Fat Layer Exposed: No Necrotic Quality: Adherent Slough Tendon Exposed: No Muscle Exposed: No Joint Exposed: No Bone Exposed: No Limited to Skin Breakdown Periwound Skin Texture Texture Color No Abnormalities Noted:  No No Abnormalities Noted: No Moisture Temperature / Pain No Abnormalities Noted: No Temperature: No Abnormality Maceration: Yes Tenderness on Palpation: Yes Moist: Yes Wound Preparation Ulcer Cleansing: Rinsed/Irrigated with Saline, Other: soap and water, Topical Anesthetic Applied: Other: lidocaine 4%, Treatment Notes Wound #1 (Right, Lateral Malleolus) 1. Cleansed with: Clean wound with Normal Saline Cleanse wound with antibacterial soap and water 2. Anesthetic Topical Lidocaine 4% cream to wound bed prior to debridement 3. Peri-wound Care: Barrier cream Other peri-wound care (specify in notes) 4. Dressing Applied: Aquacel Ag 5. Secondary Dressing Applied ABD Pad Dry Gauze 7. Secured with Tape 3 Layer Compression System - Right Lower Extremity Notes TCA Electronic Signature(s) Signed: 08/14/2016 4:44:26 PM By: Alejandro Mulling Entered By: Alejandro Mulling on 08/14/2016 08:18:59 [name] Stack ([id number]) [name] Critchley, [name] Severance ([id number]) -------------------------------------------------------------------------------- Wound Assessment Details [name] Ringer Date of Service: 08/14/2016 8:00 AM Patient Name: C. Patient Account Number: [id number] Medical Record Treating RN: [name] [id number] Number: Other Clinician: Date of Birth/Sex: 20-Feb-1946 (70 y.o. Male) Treating [name] Primary Care Physician: [facility] [place] Physician/Extender: G Referring Physician: Tania [name] in Treatment: 30 Wound Status Wound Number: 2 Primary Venous Leg Ulcer Etiology: Wound Location: Right Malleolus - Medial Wound Open Wounding Event: Gradually Appeared Status: Date Acquired: 01/30/2016 Comorbid Cataracts, Chronic Obstructive Weeks Of Treatment: 28 History: Pulmonary Disease (COPD), Clustered Wound: Yes Osteoarthritis Photos Photo Uploaded By: Alejandro Mulling on 08/14/2016 08:25:43 Wound Measurements Length: (cm) 3.9 Width: (cm) 2.9 Depth: (cm)  0.2 Area: (cm) 8.883 Volume: (cm) 1.777 % Reduction in Area: 23.1% % Reduction in Volume: -53.9% Epithelialization: None Tunneling: No Undermining: No Wound Description Full Thickness Without Exposed Classification: Support Structures Wound Margin: Distinct, outline attached Exudate Large Amount: Exudate Type: Purulent Exudate Color: yellow, brown, green Foul Odor After Cleansing: No Wound Bed Graddy, [name] ([id number]) Granulation Amount: Medium (34-66%) Exposed Structure Granulation Quality: Red Fascia Exposed: No Necrotic Amount: Medium (34-66%) Fat Layer Exposed: No Necrotic Quality: Adherent Slough Tendon Exposed: No Muscle Exposed: No Joint Exposed: No Bone Exposed: No Limited to Skin Breakdown Periwound Skin Texture Texture Color No Abnormalities Noted: No No Abnormalities Noted: No Moisture Temperature / Pain No Abnormalities Noted: No Temperature: No Abnormality Maceration:  Yes Tenderness on Palpation: Yes Moist: Yes Wound Preparation Ulcer Cleansing: Rinsed/Irrigated with Saline, Other: soap and water, Topical Anesthetic Applied: Other: lidocaine 4%, Treatment Notes Wound #2 (Right, Medial Malleolus) 1. Cleansed with: Clean wound with Normal Saline Cleanse wound with antibacterial soap and water 2. Anesthetic Topical Lidocaine 4% cream to wound bed prior to debridement 3. Peri-wound Care: Barrier cream Other peri-wound care (specify in notes) 4. Dressing Applied: Aquacel Ag 5. Secondary Dressing Applied ABD Pad Dry Gauze 7. Secured with Tape 3 Layer Compression System - Right Lower Extremity Notes TCA Electronic Signature(s) Signed: 08/14/2016 4:44:26 PM By: Alejandro MullingPinkerton, Debra Entered By: Alejandro MullingPinkerton, Debra on 08/14/2016 08:20:08 [name], [name] ([id number]) [name], [name] ([id number]) -------------------------------------------------------------------------------- Vitals Details [name], [name] Date of Service:  08/14/2016 8:00 AM Patient Name: C. Patient Account Number: [id number] Medical Record Treating RN: Phillis Haggisinkerton, [name] [id number] Number: Other Clinician: Date of Birth/Sex: 12-24-1945 [age] y.o. Male) Treating Baltazar NajjarOBSON, [name] [place] Physician/Extender: G Referring Physician: Tania AdeWeeks in Treatment: 30 Vital Signs Time Taken: 08:06 Temperature (F): 97.7 Height (in): 76 Pulse (bpm): 74 Weight (lbs): 238 Respiratory Rate (breaths/min): 18 Body Mass Index (BMI): 29 Blood Pressure (mmHg): 139/67 Reference Range: 80 - 120 mg / dl Electronic Signature(s) Signed: 08/14/2016 4:44:26 PM By: Alejandro MullingPinkerton, Debra Entered By: Alejandro MullingPinkerton, Debra on 08/14/2016 08:09:16

## 2016-08-17 ENCOUNTER — Ambulatory Visit: Payer: Medicare HMO

## 2016-08-17 DIAGNOSIS — S91301A Unspecified open wound, right foot, initial encounter: Secondary | ICD-10-CM

## 2016-08-17 DIAGNOSIS — I87331 Chronic venous hypertension (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: Secondary | ICD-10-CM | POA: Diagnosis not present

## 2016-08-21 ENCOUNTER — Encounter: Payer: Self-pay | Admitting: *Deleted

## 2016-08-21 ENCOUNTER — Ambulatory Visit: Payer: Medicare HMO | Admitting: Internal Medicine

## 2016-08-21 ENCOUNTER — Ambulatory Visit: Payer: Medicare HMO | Admitting: Cardiovascular Disease

## 2016-08-24 ENCOUNTER — Encounter: Payer: Medicare HMO | Admitting: Surgery

## 2016-08-24 DIAGNOSIS — I87331 Chronic venous hypertension (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: Secondary | ICD-10-CM | POA: Diagnosis not present

## 2016-08-25 NOTE — Progress Notes (Signed)
[name], [name] ([id number]) Visit Report for 08/24/2016 Arrival Information Details Patient Name: [name], [name] Date of Service: 08/24/2016 3:15 PM Medical Record Number: [id number] Patient Account Number: [id number] Date of Birth/Sex: Apr 12, 1946 [age] y.o. Male) Treating RN: Clover MealyAfful, RN, BSN, [place] Sinkita Primary Care Physician: [place] Other Clinician: Referring Physician: [facility], [place] Treating Physician/Extender: Rudene ReBritto, Errol Weeks in Treatment: 31 Visit Information History Since Last Visit All ordered tests and consults were completed: No Patient Arrived: Ambulatory Added or deleted any medications: No Arrival Time: 15:53 Any new allergies or adverse reactions: No Accompanied By: wife Had a fall or experienced change in No Transfer Assistance: None activities of daily living that may affect Patient Identification Verified: Yes risk of falls: Secondary Verification Process Yes Signs or symptoms of abuse/neglect since last No Completed: visito Patient Requires Transmission-Based No Hospitalized since last visit: No Precautions: Has Dressing in Place as Prescribed: Yes Patient Has Alerts: No Has Compression in Place as Prescribed: Yes Pain Present Now: No Electronic Signature(s) Signed: 08/24/2016 5:28:47 PM By: Elpidio EricAfful, Rita BSN, RN Entered By: Elpidio EricAfful, Rita on 08/24/2016 15:54:34 [name], [name] ([id number]) -------------------------------------------------------------------------------- Encounter Discharge Information Details Patient Name: [name], [name] Date of Service: 08/24/2016 3:15 PM Medical Record Number: [id number] Patient Account Number: [id number] Date of Birth/Sex: Apr 12, 1946 [age] y.o. Male) Treating RN: Clover MealyAfful, RN, BSN, [place] Sinkita [place] Phys[place] Other Clinician: Referring Physician: [facility], [place] Treating Physician/Extender: Rudene ReBritto, Errol Weeks in Treatment: 6731 Encounter Discharge Information Items Discharge Pain Level:  0 Discharge Condition: Stable Ambulatory Status: Ambulatory Discharge Destination: Home Private Transportation: Auto Accompanied By: wife Schedule Follow-up Appointment: No Medication Reconciliation completed and No provided to Patient/Care Hanya Guerin: Clinical Summary of Care: Electronic Signature(s) Signed: 08/24/2016 5:28:47 PM By: Elpidio EricAfful, Rita BSN, RN Entered By: Elpidio EricAfful, Rita on 08/24/2016 16:21:52 [name], [name] ([id number]) -------------------------------------------------------------------------------- Lower Extremity Assessment Details Patient Name: [name], [name] Date of Service: 08/24/2016 3:15 PM Medical Record Number: [id number] Patient Account Number: [id number] Date of Birth/Sex: Apr 12, 1946 [age] y.o. Male) Treating RN: Clover MealyAfful, RN, BSN, [place] Sinkita [place] Care Physician: [place] Other Clinician: Referring Physician: [facility], [place] Treating Physician/Extender: Rudene ReBritto, Errol Weeks in Treatment: 31 Edema Assessment Assessed: [Left: No] [Right: No] E[Left: dema] [Right: :] Calf Left: Right: Point of Measurement: 40 cm From Medial Instep cm 33.5 cm Ankle Left: Right: Point of Measurement: 12 cm From Medial Instep cm 23.1 cm Vascular Assessment Claudication: Claudication Assessment [Right:None] Pulses: Dorsalis Pedis Palpable: [Right:Yes] Posterior Tibial Extremity colors, hair growth, and conditions: Extremity Color: [Right:Dusky] Hair Growth on Extremity: [Right:No] Temperature of Extremity: [Right:Warm] Capillary Refill: [Right:< 3 seconds] Toe Nail Assessment Left: Right: Thick: Yes Discolored: Yes Deformed: No Improper Length and Hygiene: No Electronic Signature(s) Signed: 08/24/2016 5:28:47 PM By: Elpidio EricAfful, Rita BSN, RN Entered By: Elpidio EricAfful, Rita on 08/24/2016 16:20:18 [name], [name] ([name]) [name], [name] ([id number]) -------------------------------------------------------------------------------- Multi Wound Chart  Details Patient Name: [name], [name] Date of Service: 08/24/2016 3:15 PM Medical Record Number: [id number] Patient Account Number: [id number] Date of Birth/Sex: Apr 12, 1946 [age] y.o. Male) Treating RN: Clover MealyAfful, RN, BSN, [place] Sinkita [place] Other Clinician: Referring Physician: [facility], [place] Treating Physician/Extender: Rudene ReBritto, Errol Weeks in Treatment: 31 Vital Signs Height(in): 76 Pulse(bpm): 74 Weight(lbs): 238 Blood Pressure (mmHg): Body Mass Index(BMI): 29 Temperature(F): Respiratory Rate 18 (breaths/min): Photos: [1:No Photos] [2:No Photos] [N/A:N/A] Wound Location: [1:Right Malleolus - Lateral] [2:Right, Medial Malleolus] [N/A:N/A] Wounding Event: [1:Gradually Appeared] [2:Gradually Appeared] [N/A:N/A] Primary Etiology: [1:Venous Leg Ulcer] [2:Venous Leg Ulcer] [N/A:N/A] Comorbid History: [1:Cataracts, Chronic Obstructive Pulmonary Disease (COPD),  Osteoarthritis] [2:N/A] [N/A:N/A] Date Acquired: [1:08/11/2015] [2:01/30/2016] [N/A:N/A] Weeks of Treatment: [1:31] [2:29] [N/A:N/A] Wound Status: [1:Open] [2:Open] [N/A:N/A] Clustered Wound: [1:No] [2:Yes] [N/A:N/A] Measurements L x W x D 11x7.5x0.4 [2:5x2.5x0.2] [N/A:N/A] (cm) Area (cm) : [1:64.795] [2:9.817] [N/A:N/A] Volume (cm) : [1:25.918] [2:1.963] [N/A:N/A] % Reduction in Area: [1:-275.00%] [2:15.00%] [N/A:N/A] % Reduction in Volume: -400.00% [2:-70.00%] [N/A:N/A] Classification: [1:Full Thickness Without Exposed Support Structures] [2:Full Thickness Without Exposed Support Structures] [N/A:N/A] Exudate Amount: [1:Large] [2:N/A] [N/A:N/A] Exudate Type: [1:Purulent] [2:N/A] [N/A:N/A] Exudate Color: [1:yellow, brown, green] [2:N/A] [N/A:N/A] Wound Margin: [1:Distinct, outline attached] [2:N/A] [N/A:N/A] Granulation Amount: [1:Medium (34-66%)] [2:N/A] [N/A:N/A] Granulation Quality: [1:[name]] [2:N/A] [N/A:N/A] Necrotic Amount: [1:Medium (34-66%)] [2:N/A] [N/A:N/A] Exposed Structures: [1:Fascia:  No Fat: No] [2:N/A] [N/A:N/A] Tendon: No Muscle: No Joint: No Bone: No Limited to Skin Breakdown Epithelialization: None N/A N/A Periwound Skin Texture: No Abnormalities Noted No Abnormalities Noted N/A Periwound Skin Maceration: Yes No Abnormalities Noted N/A Moisture: Moist: Yes Periwound Skin Color: No Abnormalities Noted No Abnormalities Noted N/A Temperature: No Abnormality N/A N/A Tenderness on Yes No N/A Palpation: Wound Preparation: Ulcer Cleansing: N/A N/A Rinsed/Irrigated with Saline, Other: soap and water Topical Anesthetic Applied: None Treatment Notes Wound #1 (Right, Lateral Malleolus) 1. Cleansed with: Clean wound with Normal Saline Cleanse wound with antibacterial soap and water 4. Dressing Applied: Aquacel Ag 5. Secondary Dressing Applied ABD Pad Dry Gauze 7. Secured with Tape 3 Layer Compression System - Right Lower Extremity Notes unna to anchor Wound #2 (Right, Medial Malleolus) 1. Cleansed with: Clean wound with Normal Saline Cleanse wound with antibacterial soap and water 4. Dressing Applied: Aquacel Ag 5. Secondary Dressing Applied ABD Pad Dry Gauze 7. Secured with WAYLYN, TENBRINK ([id number]) Tape 3 Layer Compression System - Right Lower Extremity Notes unna to anchor Electronic Signature(s) Signed: 08/24/2016 4:23:02 PM By: Evlyn Kanner MD, FACS Entered By: Evlyn Kanner on 08/24/2016 16:23:02 [name] ([id number]) -------------------------------------------------------------------------------- Multi-Disciplinary Care Plan Details Patient Name: [name] Date of Service: 08/24/2016 3:15 PM Medical Record Number: [id number] Patient Account Number: [id number] Date of Birth/Sex: 06/28/1946 (70 y.o. Male) Treating RN: [name], RN, BSN, [place] Sink Primary Care Physician: [place], [place] Other Clinician: Referring Physician: [facility], [facility] Treating Physician/Extender: Rudene Re in Treatment: 31 Active  Inactive Venous Leg Ulcer Nursing Diagnoses: Knowledge deficit related to disease process and management Goals: Patient will maintain optimal edema control Date Initiated: 04/03/2016 Goal Status: Active Interventions: Compression as ordered Treatment Activities: Therapeutic compression applied : 04/03/2016 Notes: Wound/Skin Impairment Nursing Diagnoses: Impaired tissue integrity Goals: Ulcer/skin breakdown will heal within 14 weeks Date Initiated: 01/17/2016 Goal Status: Active Interventions: Assess ulceration(s) every visit Notes: Electronic Signature(s) Signed: 08/24/2016 5:28:47 PM By: [name] BSN, RN Entered By: [name] on 08/24/2016 16:20:25 [name] ([name], [name] ([id number]) -------------------------------------------------------------------------------- Pain Assessment Details Patient Name: [name] Date of Service: 08/24/2016 3:15 PM Medical Record Number: [id number] Patient Account Number: [id number] Date of Birth/Sex: 1945-09-28 (70 y.o. Male) Treating RN: [name], RN, BSN, [place] Sink Primary Care Physician: [place], [place] Other Clinician: Referring Physician: [facility], [facility] Treating Physician/Extender: Rudene Re in Treatment: 31 Active Problems Location of Pain Severity and Description of Pain Patient Has Paino No Site Locations With Dressing Change: No Pain Management and Medication Current Pain Management: Electronic Signature(s) Signed: 08/24/2016 5:28:47 PM By: [name] BSN, RN Entered By: [name] on 08/24/2016 15:54:42 [name] ([id number]) -------------------------------------------------------------------------------- Patient/Caregiver Education Details Patient Name: [name] Date of Service: 08/24/2016 3:15 PM Medical Record Number: [id number] Patient Account  Number: [id number] Date of Birth/Gender: 1946/04/12 (70 y.o. Male) Treating RN: [name], RN, BSN, [place] Sink Primary  Care Physician: [place], [place] Other Clinician: Referring Physician: [facility], [place] Treating Physician/Extender: Rudene Re in Treatment: 53 Education Assessment Education Provided To: Patient Education Topics Provided Electronic Signature(s) Signed: 08/24/2016 5:28:47 PM By: [name] BSN, RN Entered By: [name] on 08/24/2016 16:22:02 [name] ([id number]) -------------------------------------------------------------------------------- Wound Assessment Details Patient Name: [name] Date of Service: 08/24/2016 3:15 PM Medical Record Number: [id number] Patient Account Number: [id number] Date of Birth/Sex: 05/18/1946 (70 y.o. Male) Treating RN: [name], RN, BSN, Rita Primary Care Physician: [place], [place] Other Clinician: Referring Physician: [facility], [phone] Treating Physician/Extender: Rudene Re in Treatment: 31 Wound Status Wound Number: 1 Primary Venous Leg Ulcer Etiology: Wound Location: Right Malleolus - Lateral Wound Open Wounding Event: Gradually Appeared Status: Date Acquired: 08/11/2015 Comorbid Cataracts, Chronic Obstructive Weeks Of Treatment: 31 History: Pulmonary Disease (COPD), Clustered Wound: No Osteoarthritis Photos Photo Uploaded By: [name] on 08/24/2016 17:25:20 Wound Measurements Length: (cm) 11 Width: (cm) 7.5 Depth: (cm) 0.4 Area: (cm) 64.795 Volume: (cm) 25.918 % Reduction in Area: -275% % Reduction in Volume: -400% Epithelialization: None Tunneling: No Undermining: No Wound Description Full Thickness Without Exposed Classification: Support Structures Wound Margin: Distinct, outline attached Exudate Large Amount: Exudate Type: Purulent Exudate Color: yellow, brown, green Foul Odor After Cleansing: No Wound Bed Granulation Amount: Medium (34-66%) Exposed Structure Granulation Quality: [name] Fascia Exposed: No [name], [name] ([id number]) Necrotic Amount: Medium (34-66%) Fat Layer  Exposed: No Necrotic Quality: Adherent Slough Tendon Exposed: No Muscle Exposed: No Joint Exposed: No Bone Exposed: No Limited to Skin Breakdown Periwound Skin Texture Texture Color No Abnormalities Noted: No No Abnormalities Noted: No Moisture Temperature / Pain No Abnormalities Noted: No Temperature: No Abnormality Maceration: Yes Tenderness on Palpation: Yes Moist: Yes Wound Preparation Ulcer Cleansing: Rinsed/Irrigated with Saline, Other: soap and water, Topical Anesthetic Applied: None Treatment Notes Wound #1 (Right, Lateral Malleolus) 1. Cleansed with: Clean wound with Normal Saline Cleanse wound with antibacterial soap and water 4. Dressing Applied: Aquacel Ag 5. Secondary Dressing Applied ABD Pad Dry Gauze 7. Secured with Tape 3 Layer Compression System - Right Lower Extremity Notes unna to anchor Electronic Signature(s) Signed: 08/24/2016 5:28:47 PM By: [name] BSN, RN Entered By: [name] on 08/24/2016 16:18:12 [name] ([id number]) -------------------------------------------------------------------------------- Wound Assessment Details Patient Name: [name] Date of Service: 08/24/2016 3:15 PM Medical Record Number: [id number] Patient Account Number: [id number] Date of Birth/Sex: 1946-06-30 (70 y.o. Male) Treating RN: [name], RN, BSN, Rita Primary Care Physician: [place], [place] Other Clinician: Referring Physician: [facility], [place] Treating Physician/Extender: Rudene Re in Treatment: 31 Wound Status Wound Number: 2 Primary Etiology: Venous Leg Ulcer Wound Location: Right, Medial Malleolus Wound Status: Open Wounding Event: Gradually Appeared Date Acquired: 01/30/2016 Weeks Of Treatment: 29 Clustered Wound: Yes Photos Photo Uploaded By: [name] on 08/24/2016 17:25:20 Wound Measurements Length: (cm) 5 Width: (cm) 2.5 Depth: (cm) 0.2 Area: (cm) 9.817 Volume: (cm) 1.963 % Reduction in Area: 15% %  Reduction in Volume: -70% Wound Description Full Thickness Without Exposed Classification: Support Structures Periwound Skin Texture Texture Color No Abnormalities Noted: No No Abnormalities Noted: No Moisture No Abnormalities Noted: No Treatment Notes Wound #2 (Right, Medial Malleolus) Arntson, Kasten C. ([id number]) 1. Cleansed with: Clean wound with Normal Saline Cleanse wound with antibacterial soap and water 4. Dressing Applied: Aquacel Ag 5. Secondary Dressing Applied ABD Pad Dry Gauze 7. Secured with Tape 3 Layer Compression System -  Right Lower Extremity Notes unna to anchor Electronic Signature(s) Signed: 08/24/2016 5:28:47 PM By: Elpidio EricAfful, Rita BSN, RN Entered By: Elpidio EricAfful, Rita on 08/24/2016 16:10:57 [name], [name] ([id number]) -------------------------------------------------------------------------------- Vitals Details Patient Name: [name], Gabriell C. Date of Service: 08/24/2016 3:15 PM Medical Record Number: [id number] Patient Account Number: [id number] Date of Birth/Sex: 1946-05-24 [age] y.o. Male) Treating RN: Clover MealyAfful, RN, BSN, Rita Primary Care Physician: [place], [place] Other Clinician: Referring Physician: [facility], [phone] Treating Physician/Extender: Rudene ReBritto, Errol Weeks in Treatment: 31 Vital Signs Time Taken: 15:54 Pulse (bpm): 74 Height (in): 76 Respiratory Rate (breaths/min): 18 Weight (lbs): 238 Reference Range: 80 - 120 mg / dl Body Mass Index (BMI): 29 Electronic Signature(s) Signed: 08/24/2016 5:28:47 PM By: Elpidio EricAfful, Rita BSN, RN Entered By: Elpidio EricAfful, Rita on 08/24/2016 15:56:34

## 2016-08-25 NOTE — Progress Notes (Signed)
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 divided into 2 by a rim of normal epithelialization. 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 He denies any complaints of pain or intolerance to compression. Both ulcers are smaller in size oriented today's measurements, both are covered with a softly adherent slough. 07/24/16; medial wound is smaller, lateral about the same although surface looks better. Still using Hydrofera Blue 07/31/16; arrives today complaining of pain in the lateral part of his foot. Nurse reports a lot more drainage. [name] StackBLACKWELL, Belford C. ([id number]) He has been using Hydrofera Blue. Switch to silver alginate today 08/03/2016 -- I was asked to see the patient was here for a nurse visit today. I understand he had a lot of pain in his right lower extremity and was having blisters on his right foot which have not been there before. Though he started on doxycycline he does not have blisters elsewhere on his body. I do not believe this is a drug allergy. also mentioned that  there was a copious purulent discharge from the wound and clinically there is no evidence of cellulitis. 08/07/16; I note that the patient came in for his nurse check on Friday apparently with blisters on his toes on the right than a lot of swelling in his forefoot. He continued on the doxycycline that I had prescribed on 12/8. A culture was done of the lateral wound that showed a combination of a few Proteus and Pseudomonas. Doxycycline might of covered the Proteus but would be unlikely to cover the Pseudomonas. He is on Coumadin. He arrives in the clinic today feeling a lot better states the pain is a lot better but nothing specific really was done other than to rewrap the foot also noted that he had arterial studies ordered in August although these were never done. It is reasonable to go ahead and reorder these. 08/14/16; generally arrives in a better state today in terms of the wounds he has taken cefdinir for one week. Our intake nurse reports copious amounts of drainage but the patient is complaining of much less pain. He is not had his PT and  INR checked and I've asked him to do this today or tomorrow. 08/24/2016 -- patient arrives today after 10 days and said he had a stomach upset. His arterial study was done and I have reviewed this report and find it to be within normal limits. However I did not note any venous duplex studies for reflux, and Dr. Leanord Hawking may have ordered these in the past but I will leave it to him to decide if he needs these. The patient has finished his course of cefdinir. Objective Constitutional Pulse regular. Respirations normal and unlabored. Afebrile. Vitals Time Taken: 3:54 PM, Height: 76 in, Weight: 238 lbs, BMI: 29, Pulse: 74 bpm, Respiratory Rate: 18 breaths/min. Eyes Nonicteric. Reactive to light. Ears, Nose, Mouth, and Throat Lips, teeth, and gums WNL.Marland Kitchen Moist mucosa without lesions. Neck supple and nontender. No palpable supraclavicular or cervical  adenopathy. Normal sized without goiter. Respiratory WNL. No retractions.. Breath sounds WNL, No rubs, rales, rhonchi, or wheeze.Marland Kitchen [name], [name] ([id number]) Cardiovascular Heart rhythm and rate regular, no murmur or gallop.. Pedal Pulses WNL. No clubbing, cyanosis or edema. Chest Breasts symmetical and no nipple discharge.. Breast tissue WNL, no masses, lumps, or tenderness.. Lymphatic No adneopathy. No adenopathy. No adenopathy. Musculoskeletal Adexa without tenderness or enlargement.. Digits and nails w/o clubbing, cyanosis, infection, petechiae, ischemia, or inflammatory conditions.Marland Kitchen Psychiatric Judgement and insight Intact.. No evidence of depression, anxiety, or agitation.. General Notes: the wound looks clean and does not have any surrounding cellulitis or debris and no sharp debridement was required today. Integumentary (Hair, Skin) No suspicious lesions. No crepitus or fluctuance. No peri-wound warmth or erythema. No masses.. Wound #1 status is Open. Original cause of wound was Gradually Appeared. The wound is located on the Right,Lateral Malleolus. The wound measures 11cm length x 7.5cm width x 0.4cm depth; 64.795cm^2 area and 25.918cm^3 volume. The wound is limited to skin breakdown. There is no tunneling or undermining noted. There is a large amount of purulent drainage noted. The wound margin is distinct with the outline attached to the wound base. There is medium (34-66%) red granulation within the wound bed. There is a medium (34-66%) amount of necrotic tissue within the wound bed including Adherent Slough. The periwound skin appearance exhibited: Maceration, Moist. Periwound temperature was noted as No Abnormality. The periwound has tenderness on palpation. Wound #2 status is Open. Original cause of wound was Gradually Appeared. The wound is located on the Right,Medial Malleolus. The wound measures 5cm length x 2.5cm width x 0.2cm depth; 9.817cm^2 area and 1.963cm^3  volume. 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 ([id number]) Plan Wound Cleansing: Wound #1 Right,Lateral Malleolus: Cleanse wound with mild soap and water May shower with protection. No tub bath. Wound #2 Right,Medial Malleolus: Cleanse wound with mild soap and water May shower with protection. No tub bath. Skin Barriers/Peri-Wound Care: Wound #1 Right,Lateral Malleolus: Barrier cream - zinc oxide Triamcinolone Acetonide Ointment Wound #2 Right,Medial Malleolus: Barrier cream - zinc oxide Triamcinolone Acetonide Ointment Primary Wound Dressing: Wound #1 Right,Lateral Malleolus: Aquacel Ag Wound #2 Right,Medial Malleolus: Aquacel Ag Secondary Dressing: Wound #1 Right,Lateral Malleolus: ABD pad Dry Gauze Wound #2 Right,Medial Malleolus: ABD pad Dry Gauze Follow-up Appointments: Wound #1 Right,Lateral Malleolus: Return Appointment in 1  week. Other: - nurse visit Friday Wound #2 Right,Medial Malleolus: Return Appointment in 1 week. Edema Control: Wound #1 Right,Lateral Malleolus: 3 Layer Compression System - Right Lower Extremity - UNNA TO ANCHOR Elevate legs to the level of the heart and pump ankles as often as possible Wound #2 Right,Medial Malleolus: 3 Layer Compression System - Right Lower Extremity - UNNA TO ANCHOR Elevate legs to the level of the heart and pump ankles as often as possible Additional Orders / Instructions: Wound #1 Right,Lateral Malleolus: Increase protein intake. OK to return to work with the following restrictions: [name], [name] ([id number]) Activity as  tolerated Wound #2 Right,Medial Malleolus: Increase protein intake. OK to return to work with the following restrictions: Activity as tolerated Medications-please add to medication list.: Wound #1 Right,Lateral Malleolus: P.O. Antibiotics - Cefidinir Other: - Vitamin C, Zinc, Multivitamin Wound #2 Right,Medial Malleolus: Other: - Vitamin C, Zinc, Multivitamin having reviewed his wound and his general health I have recommended: 1. Silver alginate and drawtex and possibly ABDs below his 3 layer compression wrap. 2. Elevation and exercise 3. I do not find evidence of a venous duplex study done for reflux and this may need to be considered by Dr. Leanord Hawking. 4. he will see Dr. Leanord Hawking this coming Tuesday 5. I have asked him to take some probiotics and yogurt with cultures to help his GI upset Electronic Signature(s) Signed: 08/24/2016 4:27:16 PM By: Evlyn Kanner MD, FACS Previous Signature: 08/24/2016 4:26:26 PM Version By: Evlyn Kanner MD, FACS Entered By: Evlyn Kanner on 08/24/2016 16:27:16 [name] ([id number]) -------------------------------------------------------------------------------- SuperBill Details Patient Name: [name] Date of Service: 08/24/2016 Medical Record Number: [id number] Patient Account Number: [id number] Date of Birth/Sex: [birth year] (70 y.o. 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,  initial T85.613A encounter Facility Procedures CPT4: Description Modifier Quantity Code [id number] (Facility Use Only) [phone] - APPLY MULTLAY COMPRS LWR RT 1 LEG Physician Procedures CPT4: Description Modifier Quantity Code 6644034 99213 - WC PHYS LEVEL 3 - EST PT 1 ICD-10 Description Diagnosis I87.331 Chronic venous hypertension (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 L97.212 Non-pressure chronic ulcer of  right calf with fat layer exposed T85.613A 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

## 2016-08-28 ENCOUNTER — Encounter: Payer: Medicare HMO | Attending: Internal Medicine | Admitting: Internal Medicine

## 2016-08-28 DIAGNOSIS — I87331 Chronic venous hypertension (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: Secondary | ICD-10-CM | POA: Diagnosis present

## 2016-08-28 DIAGNOSIS — T85613A 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, initial encounter: Secondary | ICD-10-CM | POA: Diagnosis not present

## 2016-08-28 DIAGNOSIS — Y832 Surgical operation with anastomosis, bypass or graft as the cause of abnormal reaction of the patient, or of later complication, without mention of misadventure at the time of the procedure: Secondary | ICD-10-CM | POA: Insufficient documentation

## 2016-08-28 DIAGNOSIS — L97212 Non-pressure chronic ulcer of right calf with fat layer exposed: Secondary | ICD-10-CM | POA: Insufficient documentation

## 2016-08-28 NOTE — Progress Notes (Signed)
[name], Gardner C. ([id number]) Visit Report for 08/17/2016 Arrival Information Details Patient Name: [name], [name] Date of Service: 08/17/2016 8:00 AM Medical Record Number: [id number] Patient Account Number: [id number] Date of Birth/Sex: [birth year](71 y.o. Male) Treating RN: Phillis HaggisPinkerton, [name] [place] Care Physician: [place] Other Clinician: Referring Physician: [facility], [place] Treating Physician/Extender: Weeks in Treatment: 30 Visit Information History Since Last Visit All ordered tests and consults were completed: No Patient Arrived: Ambulatory Added or deleted any medications: No Arrival Time: 08:07 Any new allergies or adverse reactions: No Accompanied By: wife Had a fall or experienced change in No Transfer Assistance: None activities of daily living that may affect Patient Identification Verified: Yes risk of falls: Secondary Verification Process Yes Signs or symptoms of abuse/neglect since last No Completed: visito Patient Requires Transmission-Based No Hospitalized since last visit: No Precautions: Pain Present Now: No Patient Has Alerts: No Electronic Signature(s) Signed: 08/23/2016 2:11:50 PM By: Elliot GurneyWoody, RN, BSN, Kim RN, BSN Entered By: Elliot GurneyWoody, RN, BSN, Kim on 08/23/2016 14:08:19 [name], [name] ([id number]) -------------------------------------------------------------------------------- Clinic Level of Care Assessment Details Patient Name: [name], [name] Date of Service: 08/17/2016 8:00 AM Medical Record Number: [id number] Patient Account Number: [id number] Date of Birth/Sex: [birth year](71 y.o. Male) Treating RN: Huel CoventryWoody, Kim [place] Care Physician: [place] Other Clinician: Referring Physician: [facility], [place] Treating Physician/Extender: Weeks in Treatment: 30 Clinic Level of Care Assessment Items TOOL 1 Quantity Score []  - Use when EandM and Procedure is performed on INITIAL visit 0 ASSESSMENTS - Nursing Assessment /  Reassessment []  - General Physical Exam (combine w/ comprehensive assessment (listed just 0 below) when performed on new pt. evals) []  - Comprehensive Assessment (HX, ROS, Risk Assessments, Wounds Hx, etc.) 0 ASSESSMENTS - Wound and Skin Assessment / Reassessment []  - Dermatologic / Skin Assessment (not related to wound area) 0 ASSESSMENTS - Ostomy and/or Continence Assessment and Care []  - Incontinence Assessment and Management 0 []  - Ostomy Care Assessment and Management (repouching, etc.) 0 PROCESS - Coordination of Care []  - Simple Patient / Family Education for ongoing care 0 []  - Complex (extensive) Patient / Family Education for ongoing care 0 []  - Staff obtains ChiropractorConsents, Records, Test Results / Process Orders 0 []  - Staff telephones HHA, Nursing Homes / Clarify orders / etc 0 []  - Routine Transfer to another Facility (non-emergent condition) 0 []  - Routine Hospital Admission (non-emergent condition) 0 []  - New Admissions / Manufacturing engineernsurance Authorizations / Ordering NPWT, Apligraf, etc. 0 []  - Emergency Hospital Admission (emergent condition) 0 PROCESS - Special Needs []  - Pediatric / Minor Patient Management 0 []  - Isolation Patient Management 0 [name], [name] ([id number]) []  - Hearing / Language / Visual special needs 0 []  - Assessment of Community assistance (transportation, D/C planning, etc.) 0 []  - Additional assistance / Altered mentation 0 []  - Support Surface(s) Assessment (bed, cushion, seat, etc.) 0 INTERVENTIONS - Miscellaneous []  - External ear exam 0 []  - Patient Transfer (multiple staff / Nurse, adultHoyer Lift / Similar devices) 0 []  - Simple Staple / Suture removal (25 or less) 0 []  - Complex Staple / Suture removal (26 or more) 0 []  - Hypo/Hyperglycemic Management (do not check if billed separately) 0 []  - Ankle / Brachial Index (ABI) - do not check if billed separately 0 Has the patient been seen at the hospital within the last three years: Yes Total Score: 0 Level Of  Care: ____ Electronic Signature(s) Signed: 08/23/2016 2:11:50 PM By: Elliot GurneyWoody, RN, BSN, Kim RN, BSN Entered By: Elliot GurneyWoody, RN, BSN,  Kim on 08/23/2016 14:08:49 [name], [name] ([id number]) -------------------------------------------------------------------------------- Encounter Discharge Information Details Patient Name: [name], SPEAS. Date of Service: 08/17/2016 8:00 AM Medical Record Number: [id number] Patient Account Number: [id number] Date of Birth/Sex: 12/06/1945 (71 y.o. Male) Treating RN: [name] Primary Care Physician: [facility], [place] Other Clinician: Referring Physician: [facility], [place] Treating Physician/Extender: Weeks in Treatment: 30 Encounter Discharge Information Items Discharge Pain Level: 0 Discharge Condition: Stable Ambulatory Status: Ambulatory Discharge Destination: Home Private Transportation: Auto Accompanied By: wife Schedule Follow-up Appointment: Yes Medication Reconciliation completed and Yes provided to Patient/Care [name]: Clinical Summary of Care: Electronic Signature(s) Signed: 08/23/2016 2:11:50 PM By: Elliot Gurney, RN, BSN, Kim RN, BSN Entered By: Elliot Gurney, RN, BSN, Kim on 08/23/2016 14:08:41 [name] ([id number]) -------------------------------------------------------------------------------- Patient/Caregiver Education Details Patient Name: [name] Date of Service: 08/17/2016 8:00 AM Medical Record Number: [id number] Patient Account Number: [id number] Date of Birth/Gender: Jul 28, 1946 (71 y.o. Male) Treating RN: [name] Primary Care Physician: [facility], [place] Other Clinician: Referring Physician: [facility], [place] Treating Physician/Extender: Tania Ade in Treatment: 30 Education Assessment Education Provided To: Patient Education Topics Provided Wound/Skin Impairment: Handouts: Other: do not get wrap wet Methods: Demonstration, Explain/Verbal Responses: State content correctly Electronic Signature(s) Signed:  08/23/2016 2:11:50 PM By: Elliot Gurney, RN, BSN, Kim RN, BSN Entered By: Elliot Gurney, RN, BSN, Kim on 08/23/2016 14:08:34 [name] ([id number]) -------------------------------------------------------------------------------- Wound Assessment Details Patient Name: [name] Date of Service: 08/17/2016 8:00 AM Medical Record Number: [id number] Patient Account Number: [id number] Date of Birth/Sex: 1945-12-21 (71 y.o. Male) Treating RN: [name] Primary Care Physician: [place], [place] Other Clinician: Referring Physician: [facility], [place] Treating Physician/Extender: Bonnell Public Weeks in Treatment: 30 Wound Status Wound Number: 1 Primary Venous Leg Ulcer Etiology: Wound Location: Right Malleolus - Lateral Wound Open Wounding Event: Gradually Appeared Status: Date Acquired: 08/11/2015 Comorbid Cataracts, Chronic Obstructive Weeks Of Treatment: 30 History: Pulmonary Disease (COPD), Clustered Wound: No Osteoarthritis Photos Photo Uploaded By: Alejandro Mulling on 08/17/2016 08:43:22 Wound Measurements Length: (cm) 7 Width: (cm) 9.8 Depth: (cm) 0.2 Area: (cm) 53.878 Volume: (cm) 10.776 % Reduction in Area: -211.8% % Reduction in Volume: -107.9% Epithelialization: None Tunneling: No Undermining: No Wound Description Full Thickness Without Exposed Foul Odor Afte Classification: Support Structures Wound Margin: Distinct, outline attached Exudate Large Amount: Exudate Type: Purulent Exudate Color: yellow, brown, green r Cleansing: No Wound Bed Granulation Amount: Medium (34-66%) Exposed Structure Granulation Quality: Red Fascia Exposed: No [name], [name] ([id number]) Necrotic Amount: Medium (34-66%) Fat Layer Exposed: No Necrotic Quality: Adherent Slough Tendon Exposed: No Muscle Exposed: No Joint Exposed: No Bone Exposed: No Limited to Skin Breakdown Periwound Skin Texture Texture Color No Abnormalities Noted: No No Abnormalities Noted:  No Moisture Temperature / Pain No Abnormalities Noted: No Temperature: No Abnormality Maceration: Yes Tenderness on Palpation: Yes Moist: Yes Wound Preparation Ulcer Cleansing: Rinsed/Irrigated with Saline, Other: soap and water, Topical Anesthetic Applied: None Electronic Signature(s) Signed: 08/28/2016 2:01:11 PM By: Alejandro Mulling Entered By: Alejandro Mulling on 08/17/2016 08:18:35 [name] ([id number]) -------------------------------------------------------------------------------- Wound Assessment Details Patient Name: [name] Date of Service: 08/17/2016 8:00 AM Medical Record Number: [id number] Patient Account Number: [id number] Date of Birth/Sex: Jan 22, 1946 (71 y.o. Male) Treating RN: [name] Primary Care Physician: [facility], [place] Other Clinician: Referring Physician: [facility], [place] Treating Physician/Extender: Bonnell Public Weeks in Treatment: 30 Wound Status Wound Number: 2 Primary Venous Leg Ulcer Etiology: Wound Location: Right Malleolus - Medial Wound Open Wounding Event: Gradually Appeared Status: Date Acquired: 01/30/2016 Comorbid Cataracts, Chronic Obstructive Weeks Of Treatment: 28 History: Pulmonary  Disease (COPD), Clustered Wound: Yes Osteoarthritis Photos Photo Uploaded By: Alejandro Mulling on 08/17/2016 08:43:23 Wound Measurements Length: (cm) 3.9 Width: (cm) 2.9 Depth: (cm) 0.2 Area: (cm) 8.883 Volume: (cm) 1.777 % Reduction in Area: 23.1% % Reduction in Volume: -53.9% Epithelialization: None Tunneling: No Undermining: No Wound Description Full Thickness Without Exposed Classification: Support Structures Wound Margin: Distinct, outline attached Exudate Large Amount: Exudate Type: Purulent Exudate Color: yellow, brown, green Foul Odor After Cleansing: No Wound Bed Granulation Amount: Medium (34-66%) Exposed Structure Granulation Quality: Red Fascia Exposed: No [name], [name]  ([id number]) Necrotic Amount: Medium (34-66%) Fat Layer Exposed: No Necrotic Quality: Adherent Slough Tendon Exposed: No Muscle Exposed: No Joint Exposed: No Bone Exposed: No Limited to Skin Breakdown Periwound Skin Texture Texture Color No Abnormalities Noted: No No Abnormalities Noted: No Moisture Temperature / Pain No Abnormalities Noted: No Temperature: No Abnormality Maceration: Yes Tenderness on Palpation: Yes Moist: Yes Wound Preparation Ulcer Cleansing: Rinsed/Irrigated with Saline, Other: soap and water, Topical Anesthetic Applied: None Electronic Signature(s) Signed: 08/28/2016 2:01:11 PM By: Alejandro Mulling Entered By: Alejandro Mulling on 08/17/2016 08:17:35
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
## 2016-08-29 NOTE — Progress Notes (Addendum)
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 surface continues to improve however he continues to require debridement switch her easier each week. Continue Santyl/Metahydrin mixture Hydrofera Blue next week. Still drainage on the medial aspect according to the intake nurse 05/08/16; still using Santyl and Medihoney. Still a lot of drainage per her intake nurse. Patient has no complaints pain fever chills etc. 05/15/16 switched the Hydrofera Blue last week. Dimensions down especially in the medial right leg wound. Area on the lateral which is more substantial also looks better still requires debridement 05/22/16; we have been using Hydrofera Blue. 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 Currently he tells me that the pain is doing very well about a 1 out of 10 today. Yesterday was a little bit worse but he tells me that he was more active watering his flowers that day. Overall he feels that his symptoms are doing significantly better at this point in time. His edema continues to be controlled well with the 4-layer  compression wrap and he really has not noted any odor at this point in time. He is tolerating the dressing changes when they are performed well. 06/05/16 at this point in time today patient currently shows no interval signs or symptoms of local or systemic infection. Again his pain level he rates to be a 1 out of 10 at most and overall he tells me that generally this is not giving him much trouble. In fact he even feels maybe a little bit better than last week. We have continue with the 4-layer compression wrap in which she tolerates very well at this point. He is continuing to utilize the [place]. 06/12/16 I think there has been some progression in the status of both of these wounds over today again covered in a gelatinous surface. Has been using Hydrofera Blue. We had used Iodoflex in the past I'm not sure if there was an issue other than changing to something that might progress towards closure faster 06/19/16; he did not tolerate the Flexeril last week secondary to pain and this was changed on Friday back to [facility] area he continues to have copious amounts of gelatinous surface slough which is think inhibiting the speed of healing this area 06/26/16 patient over the last week has utilized the Santyl to try to loosen up some of the tightly adherent slough that was noted on evaluation last week. The good news is he tells me that the medial malleoli region really does not bother him the right llateral malleoli region is more tender to palpation at this point in time especially in the central/inferior location. However it does appear that the Santyl has done his job to loosen up the adherent slough at this point in time. Fortunately he has no interval signs or symptoms of infection locally or systemically no purulent discharge noted. 07/03/16 at this point in time today patient's wounds appear to be significantly improved over  the right medial and lateral malleolus  locations. He has much less tenderness at this point in time and the wounds appear clean her although there is still adherent slough this is sufficiently improved over what I saw last week. I still see no evidence of local infection. 07/10/16; continued gradual improvement in the right medial and lateral malleolus locations. The lateral is more substantial wound now divided into 2 by a rim of normal epithelialization. 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 He denies any complaints of pain or intolerance to compression. Both ulcers are smaller in size oriented today's measurements, both are covered with a softly adherent slough. [name], [name] ([id number]) 07/24/16; medial wound is smaller, lateral about the same although surface looks better. Still using Hydrofera Blue 07/31/16; arrives today complaining of pain in the lateral part of his foot. Nurse reports a lot more drainage. He has been using Hydrofera Blue. Switch to silver alginate today 08/03/2016 -- I was asked to see the patient was here for a nurse visit today. I understand he had a lot of pain in his right lower extremity and was having blisters on his right foot which have not been there before. Though he started on doxycycline he does not have blisters elsewhere on his body. I do not believe this is a drug allergy. also mentioned that there was a copious purulent discharge from the wound and clinically there is no evidence of cellulitis. 08/07/16; I note that the patient came in for his nurse check on Friday apparently with blisters on his toes on the right than a lot of swelling in his forefoot. He continued on the doxycycline that I had prescribed on 12/8. A culture was done of the lateral wound that showed a combination of a few Proteus and Pseudomonas. Doxycycline might of covered the Proteus but  would be unlikely to cover the Pseudomonas. He is on Coumadin. He arrives in the clinic today feeling a lot better states the pain is a lot better but nothing specific really was done other than to rewrap the foot also noted that he had arterial studies ordered in August although these were never done. It is reasonable to go ahead and reorder these. 08/14/16; generally arrives in a better state today in terms of the wounds he has taken cefdinir for one week. Our intake nurse reports copious amounts of drainage but the patient is complaining of much less pain. He is not had his PT and INR checked and I've asked him to do this today or tomorrow. 08/24/2016 -- patient arrives today after 10 days and said he had a stomach upset. His arterial study was done and I have reviewed this report and find it to be within normal limits. However I did not note any venous duplex studies for reflux, and Dr. Leanord Hawking may have ordered these in the past but I will leave it to him to decide if he needs these. The patient has finished his course of cefdinir. 08/28/16; patient arrives today again with copious amounts of thick really green drainage for our intake nurse. He states he has a very tender spot at the superior part of the lateral wound. Wounds are larger Objective Constitutional Sitting or standing Blood Pressure is within target range for patient.. Pulse regular and within target range for patient.Marland Kitchen Respirations regular, non-labored and within target range.. Temperature is normal and within the target range  for the patient.. Patient's appearance is neat and clean. Appears in no acute distress. Well nourished and well developed.. Vitals Time Taken: 3:30 PM, Height: 76 in, Weight: 238 lbs, BMI: 29, Temperature: 98.1 F, Pulse: 100 bpm, Respiratory Rate: 18 breaths/min, Blood Pressure: 153/88 mmHg. General Notes: Wound exam; wounds are covered and a thick adherent surface slough. Debrided with a #5 curet  removing the slough and nonviable subcutaneous tissue. Post debridement the wound medially actually appears to have reasonably healthy base whereas the right side, larger wound still has adherent [name], [name] ([id number]) material. The wound is not overtly infected although the patient once again shows me an area superiorly that is very tender Integumentary (Hair, Skin) Wound #1 status is Open. Original cause of wound was Gradually Appeared. The wound is located on the Right,Lateral Malleolus. The wound measures 7.5cm length x 8.6cm width x 0.4cm depth; 50.658cm^2 area and 20.263cm^3 volume. The wound is limited to skin breakdown. There is no tunneling or undermining noted. There is a large amount of purulent drainage noted. The wound margin is distinct with the outline attached to the wound base. There is no granulation within the wound bed. There is a large (67-100%) amount of necrotic tissue within the wound bed including Adherent Slough. The periwound skin appearance exhibited: Maceration, Moist. Periwound temperature was noted as No Abnormality. The periwound has tenderness on palpation. Wound #2 status is Open. Original cause of wound was Gradually Appeared. The wound is located on the Right,Medial Malleolus. The wound measures 4.2cm length x 3.2cm width x 0.2cm depth; 10.556cm^2 area and 2.111cm^3 volume. The wound is limited to skin breakdown. There is no tunneling or undermining noted. There is a large amount of purulent drainage noted. The wound margin is distinct with the outline attached to the wound base. There is no granulation within the wound bed. There is a large (67-100%) amount of necrotic tissue within the wound bed including Adherent Slough. The periwound skin appearance exhibited: Maceration, Moist. Periwound temperature was noted as No Abnormality. The periwound has tenderness on palpation. 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 There was a Skin/Subcutaneous Tissue Debridement ([id number]) debridement with total area of 64.5 sq cm performed by [name] Caul, MD. with the following instrument(s): Curette to remove Viable and Non-Viable tissue/material including Exudate, Fibrin/Slough, and Subcutaneous after achieving pain control using Lidocaine 4% Topical Solution. A time out was conducted at 15:41, prior to the start of the procedure. A Minimum amount of bleeding was controlled with Pressure. The procedure was tolerated well with a pain level of 0 throughout and a pain level of 0 following the procedure. Post Debridement Measurements: 7.5cm length x 8.6cm width [name], [name] ([id number]) x 0.5cm depth; 25.329cm^3 volume. Character of Wound/Ulcer Post Debridement requires further debridement. Severity of Tissue Post Debridement is: Fat layer exposed. Post procedure Diagnosis Wound #1: Same as Pre-Procedure Wound #2 Wound #2 is a Venous Leg Ulcer located on the Right,Medial Malleolus . There was a Skin/Subcutaneous Tissue Debridement ([id number]) debridement with total area of 13.44 sq cm performed by [name] Caul, MD. with the following instrument(s): Curette to remove  Viable and Non-Viable tissue/material including Exudate, Fibrin/Slough, and Subcutaneous after achieving pain control using Lidocaine 4% Topical Solution. A time out was conducted at 15:41, prior to the start of the procedure. A Minimum amount of bleeding was controlled with Pressure. The procedure was tolerated well with a pain  level of 0 throughout and a pain level of 0 following the procedure. Post Debridement Measurements: 4.2cm length x 3.2cm width x 0.3cm depth; 3.167cm^3 volume. Character of Wound/Ulcer Post Debridement requires further debridement. Severity of Tissue Post Debridement is: Fat layer exposed. Post procedure Diagnosis Wound #2: Same as Pre-Procedure Plan Wound Cleansing: Wound #1 Right,Lateral Malleolus: Cleanse wound with mild soap and water May shower with protection. No tub bath. Wound #2 Right,Medial Malleolus: Cleanse wound with mild soap and water May shower with protection. No tub bath. Skin Barriers/Peri-Wound Care: Wound #1 Right,Lateral Malleolus: Barrier cream - zinc oxide Triamcinolone Acetonide Ointment Wound #2 Right,Medial Malleolus: Barrier cream - zinc oxide Triamcinolone Acetonide Ointment Primary Wound Dressing: Wound #1 Right,Lateral Malleolus: Iodoflex Wound #2 Right,Medial Malleolus: Iodoflex Secondary Dressing: Wound #1 Right,Lateral Malleolus: ABD pad Hata, Saahas C. ([id number]) Dry Gauze XtraSorb Wound #2 Right,Medial Malleolus: ABD pad Dry Gauze Drawtex Follow-up Appointments: Wound #1 Right,Lateral Malleolus: Return Appointment in 1 week. Other: - nurse visit Friday Wound #2 Right,Medial Malleolus: Return Appointment in 1 week. Other: - nurse visit Friday Edema Control: Wound #1 Right,Lateral Malleolus: 3 Layer Compression System - Right Lower Extremity - UNNA TO ANCHOR Elevate legs to the level of the heart and pump ankles as often as possible Wound #2 Right,Medial Malleolus: 3 Layer Compression System - Right Lower Extremity - UNNA TO ANCHOR Elevate legs to the level of the heart and pump ankles as often as possible Additional Orders / Instructions: Wound #1 Right,Lateral Malleolus: Increase protein intake. OK to return to work with the following restrictions: Activity as tolerated Wound #2 Right,Medial Malleolus: Increase  protein intake. OK to return to work with the following restrictions: Activity as tolerated Medications-please add to medication list.: Wound #1 Right,Lateral Malleolus: P.O. Antibiotics - Cefidinir Other: - Vitamin C, Zinc, Multivitamin Wound #2 Right,Medial Malleolus: P.O. Antibiotics - Cefidinir Other: - Vitamin C, Zinc, Multivitamin Services and Therapies ordered were: Venous Duplex Doppler - AVVS The following medication(s) was prescribed: cefdinir oral 300 mg capsule bid bid capsule oral starting 08/28/2016 I will move his primary dressing back to Iodoflex as a primary debridement agent. This seemed to clean up his wound well enough that we switched the [facility] a month ago however he developed infection and ended up on silver alginate for the last 4 weeks. [name], [name] ([id number]) His arterial studies are reasonably normal. We'll order venous reflux studies. Given the tender spot just above the lateral wound I'm going to give him another week of Cefdinir. He cultured gram negatives fairly consistently and unfortunately is on Coumadin therefore not a candidate for a quinolone Electronic Signature(s) Signed: 08/28/2016 6:01:28 PM By: Elliot Gurney RN, BSN, Kim RN, BSN Signed: 08/29/2016 5:37:12 PM By: [name] Najjar MD Previous Signature: 08/28/2016 4:57:22 PM Version By: [name] Najjar MD Entered By: Elliot Gurney RN, BSN, Kim on 08/28/2016 18:00:44 [name] ([id number]) -------------------------------------------------------------------------------- SuperBill Details [name] Date of Service: 08/28/2016 Patient Name: C. Patient Account Number: [id number] Medical Record Treating RN: [name] [id number] Number: Other Clinician: Date of Birth/Sex: Mar 27, 1946 (71 y.o. 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, initial T85.613A encounter Facility Procedures CPT4: Description Modifier Quantity Code [id number] 11042 - DEB SUBQ TISSUE 20 SQ CM/< 1 ICD-10 Description Diagnosis L97.212 Non-pressure chronic ulcer of right calf with fat layer exposed I87.331 Chronic venous hypertension (idiopathic) with ulcer and  inflammation of right lower extremity CPT4: [id number] 11045 - DEB SUBQ TISS EA ADDL 20CM 3 ICD-10 Description Diagnosis L97.212 Non-pressure chronic ulcer of right calf with fat layer exposed I87.331 Chronic venous hypertension (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 Physician Procedures CPT4: Description Modifier Quantity Code [id number] 11042 - WC PHYS SUBQ TISS 20 SQ CM 1 ICD-10 Description Diagnosis L97.212 Non-pressure chronic ulcer of right calf with fat layer exposed ULYSESS, WITZ ([id number]) Electronic Signature(s) Signed: 08/28/2016 4:57:22 PM By: [name] Najjar MD Entered By: [name] on 08/28/2016 16:31:52

## 2016-08-31 DIAGNOSIS — I87331 Chronic venous hypertension (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: Secondary | ICD-10-CM | POA: Diagnosis not present

## 2016-09-01 NOTE — Progress Notes (Addendum)
BRAVERY, KETCHAM ([id number]) Visit Report for 08/31/2016 Arrival Information Details Patient Name: MUHANNAD, BIGNELL. Date of Service: 08/31/2016 10:45 AM Medical Record Number: [id number] Patient Account Number: [id number] Date of Birth/Sex: Jan 29, 1946 (71 y.o. Male) Treating RN: Huel Coventry Primary Care Physician: [facility], [place] Other Clinician: Referring Physician: [facility], [place] Treating Physician/Extender: Weeks in Treatment: 32 Visit Information History Since Last Visit Added or deleted any medications: No Patient Arrived: Ambulatory Any new allergies or adverse reactions: No Arrival Time: 10:49 Had a fall or experienced change in No Accompanied By: sef activities of daily living that may affect Transfer Assistance: None risk of falls: Patient Identification Verified: Yes Signs or symptoms of abuse/neglect since last No Secondary Verification Process Yes visito Completed: Hospitalized since last visit: No Patient Requires Transmission-Based No Has Dressing in Place as Prescribed: Yes Precautions: Has Compression in Place as Prescribed: Yes Patient Has Alerts: No Pain Present Now: No Electronic Signature(s) Signed: 09/05/2016 2:00:06 PM By: Elliot Gurney, RN, BSN, Kim RN, BSN Previous Signature: 09/05/2016 1:59:55 PM Version By: Elliot Gurney RN, BSN, Kim RN, BSN Previous Signature: 08/31/2016 5:50:13 PM Version By: Elliot Gurney, RN, BSN, Kim RN, BSN Entered By: Elliot Gurney, RN, BSN, Kim on 09/05/2016 14:00:05 [name] ([id number]) -------------------------------------------------------------------------------- Clinic Level of Care Assessment Details Patient Name: [name] Date of Service: 08/31/2016 10:45 AM Medical Record Number: [id number] Patient Account Number: [id number] Date of Birth/Sex: 1946/02/05 (71 y.o. Male) Treating RN: Huel Coventry Primary Care Physician: [facility], [place] Other Clinician: Referring Physician: [facility], [place] Treating Physician/Extender: Weeks in  Treatment: 32 Clinic Level of Care Assessment Items TOOL 1 Quantity Score []  - Use when EandM and Procedure is performed on INITIAL visit 0 ASSESSMENTS - Nursing Assessment / Reassessment []  - General Physical Exam (combine w/ comprehensive assessment (listed just 0 below) when performed on new pt. evals) []  - Comprehensive Assessment (HX, ROS, Risk Assessments, Wounds Hx, etc.) 0 ASSESSMENTS - Wound and Skin Assessment / Reassessment []  - Dermatologic / Skin Assessment (not related to wound area) 0 ASSESSMENTS - Ostomy and/or Continence Assessment and Care []  - Incontinence Assessment and Management 0 []  - Ostomy Care Assessment and Management (repouching, etc.) 0 PROCESS - Coordination of Care []  - Simple Patient / Family Education for ongoing care 0 []  - Complex (extensive) Patient / Family Education for ongoing care 0 []  - Staff obtains Chiropractor, Records, Test Results / Process Orders 0 []  - Staff telephones HHA, Nursing Homes / Clarify orders / etc 0 []  - Routine Transfer to another Facility (non-emergent condition) 0 []  - Routine Hospital Admission (non-emergent condition) 0 []  - New Admissions / Manufacturing engineer / Ordering NPWT, Apligraf, etc. 0 []  - Emergency Hospital Admission (emergent condition) 0 PROCESS - Special Needs []  - Pediatric / Minor Patient Management 0 []  - Isolation Patient Management 0 STACIE, KNUTZEN. ([id number]) []  - Hearing / Language / Visual special needs 0 []  - Assessment of Community assistance (transportation, D/C planning, etc.) 0 []  - Additional assistance / Altered mentation 0 []  - Support Surface(s) Assessment (bed, cushion, seat, etc.) 0 INTERVENTIONS - Miscellaneous []  - External ear exam 0 []  - Patient Transfer (multiple staff / Nurse, adult / Similar devices) 0 []  - Simple Staple / Suture removal (25 or less) 0 []  - Complex Staple / Suture removal (26 or more) 0 []  - Hypo/Hyperglycemic Management (do not check if billed  separately) 0 []  - Ankle / Brachial Index (ABI) - do not check if billed separately 0 Has the patient been seen at the  hospital within the last three years: Yes Total Score: 0 Level Of Care: ____ Electronic Signature(s) Signed: 09/05/2016 2:01:17 PM By: Elliot Gurney, RN, BSN, Kim RN, BSN Entered By: Elliot Gurney, RN, BSN, Kim on 09/05/2016 14:00:57 [name] ([id number]) -------------------------------------------------------------------------------- Compression Therapy Details Patient Name: [name] Date of Service: 08/31/2016 10:45 AM Medical Record Number: [id number] Patient Account Number: [id number] Date of Birth/Sex: 1946/08/25 (71 y.o. Male) Treating RN: Huel Coventry Primary Care Physician: [facility], [place] Other Clinician: Referring Physician: [facility], [place] Treating Physician/Extender: Rudene Re in Treatment: 32 Compression Therapy Performed for Wound Wound #2 Right,Medial Malleolus Assessment: Performed By: Cletus Gash, RN Compression Type: Three Layer Pre Treatment ABI: 1.2 Electronic Signature(s) Signed: 09/03/2016 4:50:09 PM By: Elliot Gurney, RN, BSN, Kim RN, BSN Entered By: Elliot Gurney, RN, BSN, Kim on 09/03/2016 10:36:11 [name] ([id number]) -------------------------------------------------------------------------------- Encounter Discharge Information Details Patient Name: [name] Date of Service: 08/31/2016 10:45 AM Medical Record Number: [id number] Patient Account Number: [id number] Date of Birth/Sex: Dec 31, 1945 (71 y.o. Male) Treating RN: Huel Coventry Primary Care Physician: [facility], [place] Other Clinician: Referring Physician: [facility], [place] Treating Physician/Extender: Weeks in Treatment: 11 Encounter Discharge Information Items Discharge Pain Level: 0 Discharge Condition: Stable Ambulatory Status: Ambulatory Emergency Discharge Destination: Room Transportation: Private Auto Accompanied By: self Schedule Follow-up Appointment:  Yes Medication Reconciliation completed Yes and provided to Patient/Care Kawthar Ennen: Clinical Summary of Care: Electronic Signature(s) Signed: 09/05/2016 2:00:42 PM By: Elliot Gurney, RN, BSN, Kim RN, BSN Previous Signature: 09/03/2016 4:50:09 PM Version By: Elliot Gurney, RN, BSN, Kim RN, BSN Entered By: Elliot Gurney, RN, BSN, Kim on 09/05/2016 14:00:42 [name] ([id number]) -------------------------------------------------------------------------------- Patient/Caregiver Education Details Patient Name: [name] Date of Service: 08/31/2016 10:45 AM Medical Record Number: [id number] Patient Account Number: [id number] Date of Birth/Gender: [birth year] (71 y.o. Male) Treating RN: Huel Coventry Primary Care Physician: [facility], [place] Other Clinician: Referring Physician: [facility], [place] Treating Physician/Extender: Tania Ade in Treatment: 41 Education Assessment Education Provided To: Patient Education Topics Provided Venous: Handouts: Controlling Swelling with Multilayered Compression Wraps Methods: Demonstration, Explain/Verbal Responses: State content correctly Wound/Skin Impairment: Electronic Signature(s) Signed: 09/05/2016 2:01:17 PM By: Elliot Gurney, RN, BSN, Kim RN, BSN Previous Signature: 09/03/2016 4:50:09 PM Version By: Elliot Gurney, RN, BSN, Kim RN, BSN Entered By: Elliot Gurney, RN, BSN, Kim on 09/05/2016 14:00:35 [name] ([id number]) -------------------------------------------------------------------------------- Wound Assessment Details Patient Name: [name] Date of Service: 08/31/2016 10:45 AM Medical Record Number: [id number] Patient Account Number: [id number] Date of Birth/Sex: Mar 03, 1946 (71 y.o. Male) Treating RN: Huel Coventry Primary Care Physician: [facility], [place] Other Clinician: Referring Physician: [facility], [place] Treating Physician/Extender: Rudene Re in Treatment: 32 Wound Status Wound Number: 1 Primary Etiology: Venous Leg Ulcer Wound Location: Right, Lateral  Malleolus Wound Status: Open Wounding Event: Gradually Appeared Date Acquired: 08/11/2015 Weeks Of Treatment: 32 Clustered Wound: No Photos Photo Uploaded By: Elliot Gurney, RN, BSN, Kim on 08/31/2016 17:49:08 Wound Measurements Length: (cm) 5 Width: (cm) 7 Depth: (cm) 0.4 Area: (cm) 27.489 Volume: (cm) 10.996 % Reduction in Area: -59.1% % Reduction in Volume: -112.1% Wound Description Full Thickness Without Exposed Classification: Support Structures Periwound Skin Texture Texture Color No Abnormalities Noted: No No Abnormalities Noted: No Moisture No Abnormalities Noted: No Electronic Signature(s) Signed: 08/31/2016 5:50:13 PM By: Elliot Gurney, RN, BSN, Kim RN, BSN Entered By: Elliot Gurney, RN, BSN, Kim on 08/31/2016 10:57:48 [name] ([name], [name] ([id number]) -------------------------------------------------------------------------------- Wound Assessment Details Patient Name: [name] Date of Service: 08/31/2016 10:45 AM Medical Record Number: [id number] Patient Account Number: [id number] Date of Birth/Sex: 11/24/1945 (  71 y.o. Male) Treating RN: Huel CoventryWoody, Kim [place] Phys[place] Other Clinician: Referring Physician: [facility], [place] Treating Physician/Extender: Rudene ReBritto, Errol Weeks in Treatment: 32 Wound Status Wound Number: 2 Primary Etiology: Venous Leg Ulcer Wound Location: Right, Medial Malleolus Wound Status: Open Wounding Event: Gradually Appeared Date Acquired: 01/30/2016 Weeks Of Treatment: 30 Clustered Wound: Yes Photos Photo Uploaded By: Elliot GurneyWoody, RN, BSN, Kim on 08/31/2016 17:49:09 Wound Measurements Length: (cm) 4 Width: (cm) 3 Depth: (cm) 0.2 Area: (cm) 9.425 Volume: (cm) 1.885 % Reduction in Area: 18.4% % Reduction in Volume: -63.2% Wound Description Full Thickness Without Exposed Classification: Support Structures Periwound Skin Texture Texture Color No Abnormalities Noted: No No Abnormalities Noted:  No Moisture No Abnormalities Noted: No Electronic Signature(s) Signed: 08/31/2016 5:50:13 PM By: Elliot GurneyWoody, RN, BSN, Kim RN, BSN Entered By: Elliot GurneyWoody, RN, BSN, Kim on 08/31/2016 10:57:49

## 2016-09-04 ENCOUNTER — Encounter: Payer: Medicare HMO | Admitting: Internal Medicine

## 2016-09-04 DIAGNOSIS — I87331 Chronic venous hypertension (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: Secondary | ICD-10-CM | POA: Diagnosis not present

## 2016-09-05 NOTE — Progress Notes (Signed)
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 slough and nonviable tissue. The wound will not heal like this. 04/20/2016 -- the patient was here for a nurse visit but I was asked to see the patient as the slough was quite significant and the nurse needed for clarification regarding the ointment to be used. 04/24/16; the patient's wounds on the right medial and right lateral ankle/malleolus both look a lot better today. Less adherent slough healthier tissue. Dimensions better especially medially 05/01/16; the patient's wound surface continues to improve however he continues to require debridement switch her easier each week. Continue Santyl/Metahydrin mixture Hydrofera Blue next week. Still drainage on the medial aspect according to the intake nurse 05/08/16; still using Santyl and Medihoney. Still a lot of drainage per her intake nurse. Patient has no complaints pain fever chills etc. 05/15/16 switched the Hydrofera Blue last week. Dimensions down especially in the medial right leg wound. Area on  the lateral which is more substantial also looks better still requires debridement 05/22/16; we have been using Hydrofera Blue. 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 Currently he tells me that the pain is doing very well about a 1 out of 10 today. Yesterday was a little bit worse but he tells me that he was more active watering his flowers that day. Overall he feels that his symptoms are doing significantly better at this point in time. His edema continues to be controlled well with the 4-layer compression wrap and he really has not noted any odor at this point in time. He is tolerating the dressing changes when they are performed well. 06/05/16 at this point in time today patient currently shows no interval signs or symptoms of local or systemic infection. Again his pain level he rates to be a 1 out of 10 at most and overall he tells me that generally this is not giving him much trouble. In fact he even feels maybe a little bit better than last week. We have continue with the 4-layer compression wrap in which she tolerates very well at this point. He is continuing to utilize the [place]. 06/12/16 I think there has been some progression in the status of both of these wounds over today again covered in a gelatinous surface. Has been using Hydrofera Blue. We had used Iodoflex in the past I'm not sure if there was an issue other than changing to something that might progress towards closure faster 06/19/16; he did not tolerate the Flexeril last week secondary to pain and this was changed on Friday back to [facility] area he continues to have copious amounts of gelatinous surface  slough which is think inhibiting the speed of healing this area 06/26/16 patient over the last week has utilized the Santyl to  try to loosen up some of the tightly adherent slough that was noted on evaluation last week. The good news is he tells me that the medial malleoli region really does not bother him the right llateral malleoli region is more tender to palpation at this point in time especially in the central/inferior location. However it does appear that the Santyl has done his job to loosen up the adherent slough at this point in time. Fortunately he has no interval signs or symptoms of infection locally or systemically no purulent discharge noted. 07/03/16 at this point in time today patient's wounds appear to be significantly improved over the right medial and lateral malleolus locations. He has much less tenderness at this point in time and the wounds appear clean her although there is still adherent slough this is sufficiently improved over what I saw last week. I still see no evidence of local infection. 07/10/16; continued gradual improvement in the right medial and lateral malleolus locations. The lateral is more substantial wound now divided into 2 by a rim of normal epithelialization. 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 He denies any complaints of pain or intolerance to compression. Both ulcers are smaller in size oriented today's measurements, both are covered with a softly adherent slough. GAYLIN, BULTHUIS ([id number]) 07/24/16; medial wound is smaller, lateral about the same although surface looks better. Still using Hydrofera Blue 07/31/16; arrives today complaining of pain in the lateral part of his foot. Nurse reports a lot more drainage. He has been using Hydrofera Blue. Switch to silver alginate today 08/03/2016 -- I was asked to see the patient was here for a nurse visit today. I understand he had a lot of pain in his right lower extremity and was having blisters on  his right foot which have not been there before. Though he started on doxycycline he does not have blisters elsewhere on his body. I do not believe this is a drug allergy. also mentioned that there was a copious purulent discharge from the wound and clinically there is no evidence of cellulitis. 08/07/16; I note that the patient came in for his nurse check on Friday apparently with blisters on his toes on the right than a lot of swelling in his forefoot. He continued on the doxycycline that I had prescribed on 12/8. A culture was done of the lateral wound that showed a combination of a few Proteus and Pseudomonas. Doxycycline might of covered the Proteus but would be unlikely to cover the Pseudomonas. He is on Coumadin. He arrives in the clinic today feeling a lot better states the pain is a lot better but nothing specific really was done other than to rewrap the foot also noted that he had arterial studies ordered in August although these were never done. It is reasonable to go ahead and reorder these. 08/14/16; generally arrives in a better state today in terms of the wounds he has taken cefdinir for one week. Our intake nurse reports copious amounts of drainage but the patient is complaining of much less pain. He is not had his PT and INR checked and I've asked him to do this today or tomorrow. 08/24/2016 -- patient arrives today after 10 days and said he had a stomach upset. His arterial study  was done and I have reviewed this report and find it to be within normal limits. However I did not note any venous duplex studies for reflux, and Dr. Leanord Hawking may have ordered these in the past but I will leave it to him to decide if he needs these. The patient has finished his course of cefdinir. 08/28/16; patient arrives today again with copious amounts of thick really green drainage for our intake nurse. He states he has a very tender spot at the superior part of the lateral wound. Wounds are  larger 09/04/16; no real change in the condition of this patient's wound still copious amounts of surface slough. Started him on Iodoflex last week he is completing another course of Cefdinir or which I think was done empirically. His arterial study showed ABIs were 1.1 on the right 1.5 on the left. He did have a slightly reduced ABI in the right the left one was not obtained. Had calcification of the right posterior tibial artery. The interpretation was no segmental stenosis. His waveforms were triphasic. His reflux studies are later this month. Depending on this I'll send him for a vascular consultation, he may need to see plastic surgery as I believe he is had plastic surgery on this foot in the past. He had an injury to the foot in the 1980s. Objective Constitutional Sitting or standing Blood Pressure is within target range for patient.. Pulse regular and within target range for patient.Marland Kitchen Respirations regular, non-labored and within target range.. Temperature is normal and within the target range for the patient.. Patient's appearance is neat and clean. Appears in no acute distress. Well nourished and well developed.. Vitals Time Taken: 8:05 AM, Height: 76 in, Weight: 238 lbs, BMI: 29, Temperature: 98.1 F, Pulse: 97 bpm, Clipper, Valen C. ([id number]) Respiratory Rate: 18 breaths/min, Blood Pressure: 135/67 mmHg. Cardiovascular Pedal pulses palpable and strong bilaterally.. Edema present in both extremities.. General Notes: Wound exam; the wounds are totally unchanged. The area on the right lateral leg covered in copious amounts of surface slough nonviable subcutaneous tissue removed with an open curet. The smaller area on the medial right leg also had the same covering and required the same debridement but this is not as bad as the lateral side. There is no real evidence of infection. Surrounding tissue from prior grafting in the area. Integumentary (Hair, Skin) Wound #1 status is  Open. Original cause of wound was Gradually Appeared. The wound is located on the Right,Lateral Malleolus. The wound measures 8cm length x 8.9cm width x 0.4cm depth; 55.92cm^2 area and 22.368cm^3 volume. The wound is limited to skin breakdown. There is no tunneling or undermining noted. There is a large amount of purulent drainage noted. The wound margin is distinct with the outline attached to the wound base. There is no granulation within the wound bed. There is a large (67-100%) amount of necrotic tissue within the wound bed including Adherent Slough. The periwound skin appearance exhibited: Moist. Periwound temperature was noted as No Abnormality. The periwound has tenderness on palpation. Wound #2 status is Open. Original cause of wound was Gradually Appeared. The wound is located on the Right,Medial Malleolus. The wound measures 4.2cm length x 3.3cm width x 0.2cm depth; 10.886cm^2 area and 2.177cm^3 volume. The wound is limited to skin breakdown. There is no tunneling or undermining noted. There is a large amount of purulent drainage noted. There is no granulation within the wound bed. There is a large (67-100%) amount of necrotic tissue within the wound bed including  Adherent Slough. The periwound skin appearance exhibited: Moist. Periwound temperature was noted as No Abnormality. The periwound has tenderness on palpation. 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 ([id number]) Wound #1 Wound #1 is a Venous Leg Ulcer located on the Right,Lateral Malleolus . There was a Skin/Subcutaneous Tissue Debridement  ([id number]) debridement with total area of 71.2 sq cm performed by [name], MD. with the following instrument(s): scoop to remove Viable and Non-Viable tissue/material including Exudate, Fibrin/Slough, and Subcutaneous after achieving pain control using Lidocaine 4% Topical Solution. A time out was conducted at 08:26, prior to the start of the procedure. A Minimum amount of bleeding was controlled with Pressure. The procedure was tolerated well with a pain level of 0 throughout and a pain level of 0 following the procedure. Post Debridement Measurements: 8cm length x 8.9cm width x 0.4cm depth; 22.368cm^3 volume. Character of Wound/Ulcer Post Debridement requires further debridement. Severity of Tissue Post Debridement is: Fat layer exposed. Post procedure Diagnosis Wound #1: Same as Pre-Procedure Wound #2 Wound #2 is a Venous Leg Ulcer located on the Right,Medial Malleolus . There was a Skin/Subcutaneous Tissue Debridement ([id number]) debridement with total area of 13.86 sq cm performed by [name], MD. with the following instrument(s): scoop to remove Viable and Non-Viable tissue/material including Exudate, Fibrin/Slough, and Subcutaneous after achieving pain control using Lidocaine 4% Topical Solution. A time out was conducted at 08:26, prior to the start of the procedure. A Minimum amount of bleeding was controlled with Pressure. The procedure was tolerated well with a pain level of 0 throughout and a pain level of 0 following the procedure. Post Debridement Measurements: 4.2cm length x 3.3cm width x 0.2cm depth; 2.177cm^3 volume. Character of Wound/Ulcer Post Debridement requires further debridement. Severity of Tissue Post Debridement is: Fat layer exposed. Post procedure Diagnosis Wound #2: Same as Pre-Procedure Plan Wound Cleansing: Wound #1 Right,Lateral Malleolus: Cleanse wound with mild soap and water May shower with protection. No tub bath. Wound #2  Right,Medial Malleolus: Cleanse wound with mild soap and water May shower with protection. No tub bath. Skin Barriers/Peri-Wound Care: Wound #1 Right,Lateral Malleolus: Barrier cream - zinc oxide Triamcinolone Acetonide Ointment Wound #2 Right,Medial Malleolus: Barrier cream - zinc oxide Cosma, Caylen C. ([id number]) Triamcinolone Acetonide Ointment Primary Wound Dressing: Wound #1 Right,Lateral Malleolus: Iodoflex Wound #2 Right,Medial Malleolus: Iodoflex Secondary Dressing: Wound #1 Right,Lateral Malleolus: ABD pad Dry Gauze XtraSorb Wound #2 Right,Medial Malleolus: ABD pad Dry Gauze Drawtex Follow-up Appointments: Wound #1 Right,Lateral Malleolus: Return Appointment in 1 week. Other: - nurse visit Friday Wound #2 Right,Medial Malleolus: Return Appointment in 1 week. Other: - nurse visit Friday Edema Control: Wound #1 Right,Lateral Malleolus: 3 Layer Compression System - Right Lower Extremity - UNNA TO ANCHOR Elevate legs to the level of the heart and pump ankles as often as possible Wound #2 Right,Medial Malleolus: 3 Layer Compression System - Right Lower Extremity - UNNA TO ANCHOR Elevate legs to the level of the heart and pump ankles as often as possible Additional Orders / Instructions: Wound #1 Right,Lateral Malleolus: Increase protein intake. OK to return to work with the following restrictions: Activity as tolerated Wound #2 Right,Medial Malleolus: Increase protein intake. OK to return to  work with the following restrictions: Activity as tolerated Medications-please add to medication list.: Wound #1 Right,Lateral Malleolus: P.O. Antibiotics - Cefidinir Other: - Vitamin C, Zinc, Multivitamin Wound #2 Right,Medial Malleolus: P.O. Antibiotics - Cefidinir Other: - Vitamin C, Zinc, Multivitamin Mayhall, Kendyn C. ([id number]) #1 we'll continue with the Iodoflex started last week. #2 he continues to require mechanical debridement however he tolerates  this poorly #3 I would like to get his reflux studies before sending him to vascular surgery #4 is possible he is going to need to see plastic surgery again although he would likely need operative debridement before they could do any form of breath Electronic Signature(s) Signed: 09/04/2016 4:24:12 PM By: [name] MD Entered By: [name] on 09/04/2016 08:41:37 [name] ([id number]) -------------------------------------------------------------------------------- SuperBill Details [name] Date of Service: 09/04/2016 Patient Name: C. Patient Account Number: [id number] Medical Record Treating RN: [name] [id number] Number: Other Clinician: Date of Birth/Sex: 12-09-1945 (71 y.o. 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, initial T85.613A encounter Facility Procedures CPT4: Description Modifier Quantity Code [id number] 11042 - DEB SUBQ TISSUE 20 SQ CM/< 1 ICD-10 Description Diagnosis L97.212 Non-pressure chronic ulcer of right calf with fat layer exposed I87.331 Chronic venous hypertension (idiopathic) with ulcer and  inflammation of right lower extremity CPT4: [id number] 11045 - DEB SUBQ TISS EA ADDL 20CM 1 ICD-10 Description Diagnosis L97.212 Non-pressure chronic ulcer of right calf with fat layer exposed Physician Procedures CPT4: Description Modifier Quantity Code [id number] 11042 - WC PHYS SUBQ TISS 20 SQ CM 1 ICD-10 Description Diagnosis L97.212 Non-pressure chronic ulcer of right  calf with fat layer exposed I87.331 Chronic venous hypertension (idiopathic) with ulcer and  inflammation of right lower extremity JAVONTAY, VANDAM ([id number]) Electronic Signature(s) Signed: 09/04/2016 4:24:12 PM By: [name] MD Entered By: [name] on 09/04/2016 08:42:20

## 2016-09-05 NOTE — Progress Notes (Signed)
DELOY, ARCHEY ([id number]) Visit Report for 09/04/2016 Arrival Information Details TREVION, HOBEN Date of Service: 09/04/2016 8:00 AM Patient Name: C. Patient Account Number: [id number] Medical Record Treating RN: [name] [id number] Number: Other Clinician: Date of Birth/Sex: 05-02-1946 (71 y.o. Male) Treating [name] Primary Care Physician: [facility], [place] Physician/Extender: G Referring Physician: [facility], [facility] Weeks in Treatment: 78 Visit Information History Since Last Visit All ordered tests and consults were completed: No Patient Arrived: Ambulatory Added or deleted any medications: No Arrival Time: 08:03 Any new allergies or adverse reactions: No Accompanied By: self Had a fall or experienced change in No Transfer Assistance: None activities of daily living that may affect Patient Identification Verified: Yes risk of falls: Secondary Verification Process Yes Signs or symptoms of abuse/neglect since last No Completed: visito Patient Requires Transmission-Based No Hospitalized since last visit: No Precautions: Has Dressing in Place as Prescribed: Yes Patient Has Alerts: No Has Compression in Place as Prescribed: Yes Pain Present Now: No Electronic Signature(s) Signed: 09/04/2016 4:34:43 PM By: Alejandro Mulling Entered By: Alejandro Mulling on 09/04/2016 08:05:04 [name] ([id number]) -------------------------------------------------------------------------------- Encounter Discharge Information Details [name] Date of Service: 09/04/2016 8:00 AM Patient Name: C. Patient Account Number: [id number] Medical Record Treating RN: [name] [id number] Number: Other Clinician: Date of Birth/Sex: 12/28/1945 (71 y.o. Male) Treating [name] Primary Care Physician: [facility], [place] Physician/Extender: G Referring Physician: [facility], [facility] Weeks in Treatment: 68 Encounter Discharge Information Items Discharge Pain Level:  0 Discharge Condition: Stable Ambulatory Status: Ambulatory Discharge Destination: Home Transportation: Private Auto Accompanied By: self Schedule Follow-up Appointment: Yes Medication Reconciliation completed Yes and provided to Patient/Care Amandajo Gonder: Patient Clinical Summary of Care: Declined Electronic Signature(s) Signed: 09/04/2016 8:55:31 AM By: Gwenlyn Perking Entered By: Gwenlyn Perking on 09/04/2016 08:55:31 [name] ([id number]) -------------------------------------------------------------------------------- Lower Extremity Assessment Details [name] Date of Service: 09/04/2016 8:00 AM Patient Name: C. Patient Account Number: [id number] Medical Record Treating RN: [name] [id number] Number: Other Clinician: Date of Birth/Sex: 10-Dec-1945 (70 y.o. Male) Treating [name] Primary Care Physician: [facility] [place] Physician/Extender: G Referring Physician: [facility], [facility] Weeks in Treatment: 33 Edema Assessment Assessed: [Left: No] [Right: No] E[Left: dema] [Right: :] Calf Left: Right: Point of Measurement: 40 cm From Medial Instep cm 33.4 cm Ankle Left: Right: Point of Measurement: 12 cm From Medial Instep cm 23 cm Vascular Assessment Pulses: Dorsalis Pedis Palpable: [Right:Yes] Posterior Tibial Extremity colors, hair growth, and conditions: Extremity Color: [Right:Normal] Temperature of Extremity: [Right:Warm] Capillary Refill: [Right:< 3 seconds] Toe Nail Assessment Left: Right: Thick: Yes Discolored: Yes Deformed: Yes Improper Length and Hygiene: Yes Electronic Signature(s) Signed: 09/04/2016 4:34:43 PM By: Alejandro Mulling Entered By: Alejandro Mulling on 09/04/2016 08:20:47 [name] ([name], [name] ([id number]) -------------------------------------------------------------------------------- Multi Wound Chart Details [name] Date of Service: 09/04/2016 8:00 AM Patient Name: C. Patient  Account Number: [id number] Medical Record Treating RN: [name] [id number] Number: Other Clinician: Date of Birth/Sex: November 09, 1945 (71 y.o. Male) Treating [name] Primary Care Physician: [facility] [place] Physician/Extender: G Referring Physician: [facility], [facility] Weeks in Treatment: 23 Vital Signs Height(in): 76 Pulse(bpm): 97 Weight(lbs): 238 Blood Pressure 135/67 (mmHg): Body Mass Index(BMI): 29 Temperature(F): 98.1 Respiratory Rate 18 (breaths/min): Photos: [1:No Photos] [2:No Photos] [N/A:N/A] Wound Location: [1:Right Malleolus - Lateral] [2:Right Malleolus - Medial] [N/A:N/A] Wounding Event: [1:Gradually Appeared] [2:Gradually Appeared] [N/A:N/A] Primary Etiology: [1:Venous Leg Ulcer] [2:Venous Leg Ulcer] [N/A:N/A] Comorbid History: [1:Cataracts, Chronic Obstructive Pulmonary Disease (COPD), Osteoarthritis] [2:Cataracts, Chronic Obstructive Pulmonary Disease (COPD), Osteoarthritis] [N/A:N/A] Date Acquired: [1:08/11/2015] [2:01/30/2016] [N/A:N/A]  Weeks of Treatment: [1:33] [2:31] [N/A:N/A] Wound Status: [1:Open] [2:Open] [N/A:N/A] Clustered Wound: [1:No] [2:Yes] [N/A:N/A] Measurements L x W x D 8x8.9x0.4 [2:4.2x3.3x0.2] [N/A:N/A] (cm) Area (cm) : [1:55.92] [2:10.886] [N/A:N/A] Volume (cm) : [1:22.368] [2:2.177] [N/A:N/A] % Reduction in Area: [1:-223.60%] [2:5.70%] [N/A:N/A] % Reduction in Volume: -331.50% [2:-88.50%] [N/A:N/A] Classification: [1:Full Thickness Without Exposed Support Structures] [2:Full Thickness Without Exposed Support Structures] [N/A:N/A] Exudate Amount: [1:Large] [2:Large] [N/A:N/A] Exudate Type: [1:Purulent] [2:Purulent] [N/A:N/A] Exudate Color: [1:yellow, brown, green] [2:yellow, brown, green] [N/A:N/A] Foul Odor After [1:Yes] [2:No] [N/A:N/A] Cleansing: Odor Anticipated Due to No [2:N/A] [N/A:N/A] Product Use: MILBERT, BIXLER ([id number]) Wound Margin: Distinct, outline attached N/A N/A Granulation Amount: None Present (0%) None  Present (0%) N/A Necrotic Amount: Large (67-100%) Large (67-100%) N/A Exposed Structures: Fascia: No Fascia: No N/A Fat: No Fat: No Tendon: No Tendon: No Muscle: No Muscle: No Joint: No Joint: No Bone: No Bone: No Limited to Skin Limited to Skin Breakdown Breakdown Epithelialization: None None N/A Debridement: Debridement (40981- Debridement (19147- N/A 11047) 11047) Pre-procedure 08:26 08:26 N/A Verification/Time Out Taken: Pain Control: Lidocaine 4% Topical Lidocaine 4% Topical N/A Solution Solution Tissue Debrided: Fibrin/Slough, Exudates, Fibrin/Slough, Exudates, N/A Subcutaneous Subcutaneous Level: Skin/Subcutaneous Skin/Subcutaneous N/A Tissue Tissue Debridement Area (sq 71.2 13.86 N/A cm): Instrument: Other(scoop) Other(scoop) N/A Bleeding: Minimum Minimum N/A Hemostasis Achieved: Pressure Pressure N/A Procedural Pain: 0 0 N/A Post Procedural Pain: 0 0 N/A Debridement Treatment Procedure was tolerated Procedure was tolerated N/A Response: well well Post Debridement 8x8.9x0.4 4.2x3.3x0.2 N/A Measurements L x W x D (cm) Post Debridement 22.368 2.177 N/A Volume: (cm) Periwound Skin Texture: No Abnormalities Noted No Abnormalities Noted N/A Periwound Skin Moist: Yes Moist: Yes N/A Moisture: Periwound Skin Color: No Abnormalities Noted No Abnormalities Noted N/A Temperature: No Abnormality No Abnormality N/A Tenderness on Yes Yes N/A Palpation: Wound Preparation: Ulcer Cleansing: Ulcer Cleansing: N/A Rinsed/Irrigated with Rinsed/Irrigated with Saline, Other: soap and Saline, Other: soap and water water Topical Anesthetic Topical Anesthetic HARITH, MCCADDEN. ([id number]) Applied: Other: lidocaine Applied: Other: lidocaine 4% 4% Procedures Performed: Debridement Debridement N/A Treatment Notes Wound #1 (Right, Lateral Malleolus) 1. Cleansed with: Clean wound with Normal Saline Cleanse wound with antibacterial soap and water 2.  Anesthetic Topical Lidocaine 4% cream to wound bed prior to debridement 4. Dressing Applied: Iodoflex 5. Secondary Dressing Applied ABD Pad Dry Gauze 7. Secured with Tape 3 Layer Compression System - Right Lower Extremity Notes unna to anchor, xtrasorb Wound #2 (Right, Medial Malleolus) 1. Cleansed with: Clean wound with Normal Saline Cleanse wound with antibacterial soap and water 2. Anesthetic Topical Lidocaine 4% cream to wound bed prior to debridement 4. Dressing Applied: Iodoflex 5. Secondary Dressing Applied ABD Pad Dry Gauze 7. Secured with Tape 3 Layer Compression System - Right Lower Extremity Notes unna to anchor, [employer] forces operational [employer]) Signed: 09/04/2016 4:24:12 PM By: [name] MD Entered By: [name] on 09/04/2016 08:31:26 [name] ([id number]) -------------------------------------------------------------------------------- Multi-Disciplinary Care Plan Details [name] Date of Service: 09/04/2016 8:00 AM Patient Name: C. Patient Account Number: [id number] Medical Record Treating RN: [name] [id number] Number: Other Clinician: Date of Birth/Sex: July 09, 1946 (71 y.o. Male) Treating [name] Primary Care Physician: [facility], [place] Physician/Extender: G Referring Physician: [facility] Weeks in Treatment: 98 Active Inactive Venous Leg Ulcer Nursing Diagnoses: Knowledge deficit related to disease process and management Goals: Patient will maintain optimal edema control Date Initiated: 04/03/2016 Goal Status: Active Interventions: Compression as ordered Treatment Activities: Therapeutic compression applied : 04/03/2016 Notes: Wound/Skin Impairment Nursing Diagnoses: Impaired tissue  integrity Goals: Ulcer/skin breakdown will heal within 14 weeks Date Initiated: 01/17/2016 Goal Status: Active Interventions: Assess ulceration(s) every visit Notes: Electronic Signature(s) Signed: 09/04/2016 4:34:43 PM  By: Aurelio JewPinkerton, Debra Dormer, [name] ([id number]) Entered By: Alejandro MullingPinkerton, Debra on 09/04/2016 08:27:01 [name], Khaleed C. ([id number]) -------------------------------------------------------------------------------- Pain Assessment Details [name], Bo Date of Service: 09/04/2016 8:00 AM Patient Name: C. Patient Account Number: [id number] Medical Record Treating RN: Phillis Haggisinkerton, [name] [id number] Number: Other Clinician: Date of Birth/Sex: Sep 14, 1945 [age] y.o. Male) Treating Baltazar NajjarOBSON, [name] [place] Care Phys[place] Physician/Extender: G Referring Physician: [facility], [facility] Weeks in Treatment: 5433 Active Problems Location of Pain Severity and Description of Pain Patient Has Paino No Site Locations With Dressing Change: No Pain Management and Medication Current Pain Management: Electronic Signature(s) Signed: 09/04/2016 4:34:43 PM By: Alejandro MullingPinkerton, Debra Entered By: Alejandro MullingPinkerton, Debra on 09/04/2016 08:05:10 [name], Aaidyn C. ([id number]) -------------------------------------------------------------------------------- Patient/Caregiver Education Details [name], Woodson Date of Service: 09/04/2016 8:00 AM Patient Name: C. Patient Account Number: [id number] Medical Record Treating RN: Phillis Haggisinkerton, [name] [id number] Number: Other Clinician: Date of Birth/Gender: Sep 14, 1945 (70 y.o. Male) Treating Baltazar NajjarOBSON, [name] [place] Phys[place] Physician/Extender: G Referring Physician: [facility], [facility] Weeks in Treatment: 2133 Education Assessment Education Provided To: Patient Education Topics Provided Wound/Skin Impairment: Handouts: Other: keep wrap clean and dry Methods: Demonstration, Explain/Verbal Responses: State content correctly Electronic Signature(s) Signed: 09/04/2016 4:34:43 PM By: Alejandro MullingPinkerton, Debra Entered By: Alejandro MullingPinkerton, Debra on 09/04/2016 08:31:25 [name], Reda C.  ([id number]) -------------------------------------------------------------------------------- Wound Assessment Details [name], Marland Date of Service: 09/04/2016 8:00 AM Patient Name: C. Patient Account Number: [id number] Medical Record Treating RN: Phillis Haggisinkerton, [name] [id number] Number: Other Clinician: Date of Birth/Sex: Sep 14, 1945 [age] y.o. Male) Treating [name], [name] [place] Care Phys[place] Physician/Extender: G Referring Physician: [facility], [facility] Weeks in Treatment: 33 Wound Status Wound Number: 1 Primary Venous Leg Ulcer Etiology: Wound Location: Right Malleolus - Lateral Wound Open Wounding Event: Gradually Appeared Status: Date Acquired: 08/11/2015 Comorbid Cataracts, Chronic Obstructive Weeks Of Treatment: 33 History: Pulmonary Disease (COPD), Clustered Wound: No Osteoarthritis Photos Photo Uploaded By: Elliot GurneyWoody, RN, BSN, Kim on 09/04/2016 09:46:56 Wound Measurements Length: (cm) 8 Width: (cm) 8.9 Depth: (cm) 0.4 Area: (cm) 55.92 Volume: (cm) 22.368 % Reduction in Area: -223.6% % Reduction in Volume: -331.5% Epithelialization: None Tunneling: No Undermining: No Wound Description Full Thickness Without Exposed Classification: Support Structures Wound Margin: Distinct, outline attached Exudate Large Amount: Exudate Type: Purulent Exudate Color: yellow, brown, green Foul Odor After Cleansing: Yes Due to Product Use: No Wound Bed Chavarin, Nithin C. ([id number]) Granulation Amount: None Present (0%) Exposed Structure Necrotic Amount: Large (67-100%) Fascia Exposed: No Necrotic Quality: Adherent Slough Fat Layer Exposed: No Tendon Exposed: No Muscle Exposed: No Joint Exposed: No Bone Exposed: No Limited to Skin Breakdown Periwound Skin Texture Texture Color No Abnormalities Noted: No No Abnormalities Noted: No Moisture Temperature / Pain No Abnormalities Noted: No Temperature: No Abnormality Moist: Yes Tenderness on Palpation:  Yes Wound Preparation Ulcer Cleansing: Rinsed/Irrigated with Saline, Other: soap and water, Topical Anesthetic Applied: Other: lidocaine 4%, Treatment Notes Wound #1 (Right, Lateral Malleolus) 1. Cleansed with: Clean wound with Normal Saline Cleanse wound with antibacterial soap and water 2. Anesthetic Topical Lidocaine 4% cream to wound bed prior to debridement 4. Dressing Applied: Iodoflex 5. Secondary Dressing Applied ABD Pad Dry Gauze 7. Secured with Tape 3 Layer Compression System - Right Lower Extremity Notes unna to anchor, xtrasorb Electronic Signature(s) Signed: 09/04/2016 4:34:43 PM By: Alejandro MullingPinkerton, Debra Entered By: Alejandro MullingPinkerton, Debra on 09/04/2016 08:20:15 [name], Starsky C. ([id number]) -------------------------------------------------------------------------------- Wound Assessment  Details [name] Date of Service: 09/04/2016 8:00 AM Patient Name: C. Patient Account Number: [id number] Medical Record Treating RN: [name] [id number] Number: Other Clinician: Date of Birth/Sex: 11-21-1945 (71 y.o. Male) Treating [name], [name] Primary Care Physician: [facility], [place] Physician/Extender: G Referring Physician: [facility], [facility] Weeks in Treatment: 33 Wound Status Wound Number: 2 Primary Venous Leg Ulcer Etiology: Wound Location: Right Malleolus - Medial Wound Open Wounding Event: Gradually Appeared Status: Date Acquired: 01/30/2016 Comorbid Cataracts, Chronic Obstructive Weeks Of Treatment: 31 History: Pulmonary Disease (COPD), Clustered Wound: Yes Osteoarthritis Photos Photo Uploaded By: Elliot Gurney, RN, BSN, Kim on 09/04/2016 09:46:57 Wound Measurements Length: (cm) 4.2 Width: (cm) 3.3 Depth: (cm) 0.2 Area: (cm) 10.886 Volume: (cm) 2.177 % Reduction in Area: 5.7% % Reduction in Volume: -88.5% Epithelialization: None Tunneling: No Undermining: No Wound Description Full Thickness Without Exposed Classification: Support  Structures Exudate Large Amount: Exudate Type: Purulent Exudate Color: yellow, brown, green Foul Odor After Cleansing: No Wound Bed Granulation Amount: None Present (0%) Exposed Structure Claw, Radames C. ([id number]) Necrotic Amount: Large (67-100%) Fascia Exposed: No Necrotic Quality: Adherent Slough Fat Layer Exposed: No Tendon Exposed: No Muscle Exposed: No Joint Exposed: No Bone Exposed: No Limited to Skin Breakdown Periwound Skin Texture Texture Color No Abnormalities Noted: No No Abnormalities Noted: No Moisture Temperature / Pain No Abnormalities Noted: No Temperature: No Abnormality Moist: Yes Tenderness on Palpation: Yes Wound Preparation Ulcer Cleansing: Rinsed/Irrigated with Saline, Other: soap and water, Topical Anesthetic Applied: Other: lidocaine 4%, Treatment Notes Wound #2 (Right, Medial Malleolus) 1. Cleansed with: Clean wound with Normal Saline Cleanse wound with antibacterial soap and water 2. Anesthetic Topical Lidocaine 4% cream to wound bed prior to debridement 4. Dressing Applied: Iodoflex 5. Secondary Dressing Applied ABD Pad Dry Gauze 7. Secured with Tape 3 Layer Compression System - Right Lower Extremity Notes unna to anchor, xtrasorb Electronic Signature(s) Signed: 09/04/2016 4:34:43 PM By: Alejandro Mulling Entered By: Alejandro Mulling on 09/04/2016 08:18:10 [name] ([id number]) -------------------------------------------------------------------------------- Vitals Details [name] Date of Service: 09/04/2016 8:00 AM Patient Name: C. Patient Account Number: [id number] Medical Record Treating RN: [name] [id number] Number: Other Clinician: Date of Birth/Sex: 1946-01-27 (71 y.o. Male) Treating [name], [name] Primary Care Physician: [facility], [place] Physician/Extender: G Referring Physician: [facility], [facility] Weeks in Treatment: 15 Vital Signs Time Taken: 08:05 Temperature (F): 98.1 Height (in):  76 Pulse (bpm): 97 Weight (lbs): 238 Respiratory Rate (breaths/min): 18 Body Mass Index (BMI): 29 Blood Pressure (mmHg): 135/67 Reference Range: 80 - 120 mg / dl Electronic Signature(s) Signed: 09/04/2016 4:34:43 PM By: Alejandro Mulling Entered By: Alejandro Mulling on 09/04/2016 08:05:52

## 2016-09-07 DIAGNOSIS — I87331 Chronic venous hypertension (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: Secondary | ICD-10-CM | POA: Diagnosis not present

## 2016-09-08 NOTE — Progress Notes (Addendum)
[name], Shammond C. ([id number]) Visit Report for 09/07/2016 Arrival Information Details Patient Name: [name], Blue C. Date of Service: 09/07/2016 10:45 AM Medical Record Number: [id number] Patient Account Number: [id number] Date of Birth/Sex: [birth year] 40(70 y.o. Male) Treating RN: Curtis Sitesorthy, Joanna [place] Phys[place]: [facility] [place] Other Clinician: Referring Physician: [facility] [place] Treating Physician/Extender: Weeks in Treatment: 133 Visit Information History Since Last Visit Added or deleted any medications: No Patient Arrived: Ambulatory Any new allergies or adverse reactions: No Arrival Time: 10:50 Had a fall or experienced change in No Accompanied By: self activities of daily living that may affect Transfer Assistance: None risk of falls: Patient Identification Verified: Yes Signs or symptoms of abuse/neglect since last No Secondary Verification Process Yes visito Completed: Hospitalized since last visit: No Patient Requires Transmission-Based No Has Dressing in Place as Prescribed: Yes Precautions: Has Compression in Place as Prescribed: Yes Patient Has Alerts: No Pain Present Now: No Electronic Signature(s) Signed: 09/07/2016 2:26:02 PM By: Curtis Sitesorthy, Joanna Previous Signature: 09/07/2016 2:22:26 PM Version By: Curtis Sitesorthy, Joanna Entered By: Curtis Sitesorthy, Joanna on 09/07/2016 14:26:02 [name], Aldin C. ([id number]) -------------------------------------------------------------------------------- Encounter Discharge Information Details Patient Name: [name], Sufian C. Date of Service: 09/07/2016 10:45 AM Medical Record Number: [id number] Patient Account Number: [id number] Date of Birth/Sex: [birth year](70 y.o. Male) Treating RN: Curtis Sitesorthy, Joanna [place] Phys[place]: [facility] [place] Other Clinician: Referring Physician: [facility] [place] Treating Physician/Extender: Weeks in Treatment: 3033 Encounter Discharge Information Items Discharge Pain Level: 0 Discharge Condition:  Stable Ambulatory Status: Ambulatory Discharge Destination: Home Private Transportation: Auto Accompanied By: self Schedule Follow-up Appointment: Yes Medication Reconciliation completed and No provided to Patient/Care Cletis Muma: Clinical Summary of Care: Electronic Signature(s) Signed: 09/07/2016 2:25:43 PM By: Curtis Sitesorthy, Joanna Previous Signature: 09/07/2016 2:24:45 PM Version By: Curtis Sitesorthy, Joanna Entered By: Curtis Sitesorthy, Joanna on 09/07/2016 14:25:43 [name], Coburn C. ([id number]) -------------------------------------------------------------------------------- Patient/Caregiver Education Details Patient Name: [name], Romello C. Date of Service: 09/07/2016 10:45 AM Medical Record Number: [id number] Patient Account Number: [id number] Date of Birth/Gender: [birth year] 2(70 y.o. Male) Treating RN: Curtis Sitesorthy, Joanna Primary Care Physician: [facility] [place] Other Clinician: Referring Physician: [facility] [place] Treating Physician/Extender: Weeks in Treatment: 433 Education Assessment Education Provided To: Patient Education Topics Provided Venous: Handouts: Other: leg elevation Methods: Explain/Verbal Responses: State content correctly Electronic Signature(s) Signed: 09/07/2016 5:10:42 PM By: Curtis Sitesorthy, Joanna Entered By: Curtis Sitesorthy, Joanna on 09/07/2016 14:25:51 [name], Benjimin C. ([id number]) -------------------------------------------------------------------------------- Wound Assessment Details Patient Name: [name], Rafiq C. Date of Service: 09/07/2016 10:45 AM Medical Record Number: [id number] Patient Account Number: [id number] Date of Birth/Sex: [birth year](70 y.o. Male) Treating RN: Curtis Sitesorthy, Joanna Primary Care Physician: [facility] [place] Other Clinician: Referring Physician: [facility] [place] Treating Physician/Extender: Rudene ReBritto, Errol Weeks in Treatment: 5133 Wound Status Wound Number: 1 Primary Venous Leg Ulcer Etiology: Wound Location: Right Malleolus - Lateral Wound Open Wounding Event:  Gradually Appeared Status: Date Acquired: 08/11/2015 Comorbid Cataracts, Chronic Obstructive Weeks Of Treatment: 33 History: Pulmonary Disease (COPD), Clustered Wound: No Osteoarthritis Wound Measurements Length: (cm) 8 Width: (cm) 8.9 Depth: (cm) 0.4 Area: (cm) 55.92 Volume: (cm) 22.368 % Reduction in Area: -223.6% % Reduction in Volume: -331.5% Epithelialization: None Tunneling: No Undermining: No Wound Description Full Thickness Without Exposed Classification: Support Structures Wound Margin: Distinct, outline attached Exudate Large Amount: Exudate Type: Purulent Exudate Color: yellow, brown, green Foul Odor After Cleansing: Yes Due to Product Use: No Wound Bed Granulation Amount: None Present (0%) Exposed Structure Necrotic Amount: Large (67-100%) Fascia Exposed: No Necrotic Quality: Adherent Slough Fat Layer Exposed: No Tendon Exposed: No Muscle Exposed: No Joint Exposed: No Bone Exposed: No Limited  to Skin Breakdown Periwound Skin Texture Texture Color No Abnormalities Noted: No No Abnormalities Noted: No Moisture Temperature / Pain Morrill, Gloyd C. ([id number]) No Abnormalities Noted: No Temperature: No Abnormality Moist: Yes Tenderness on Palpation: Yes Wound Preparation Ulcer Cleansing: Rinsed/Irrigated with Saline, Other: soap and water, Topical Anesthetic Applied: None Treatment Notes Wound #1 (Right, Lateral Malleolus) 1. Cleansed with: Clean wound with Normal Saline Cleanse wound with antibacterial soap and water 4. Dressing Applied: Iodoflex 5. Secondary Dressing Applied ABD Pad Dry Gauze 7. Secured with Tape 3 Layer Compression System - Right Lower Extremity Notes unna to anchor, xtrasorb Electronic Signature(s) Signed: 09/07/2016 5:10:42 PM By: Curtis Sites Entered By: Curtis Sites on 09/07/2016 14:23:08 [name] Stack  ([id number]) -------------------------------------------------------------------------------- Wound Assessment Details Patient Name: [name] Stack Date of Service: 09/07/2016 10:45 AM Medical Record Number: [id number] Patient Account Number: [id number] Date of Birth/Sex: [birth year] (71 y.o. Male) Treating RN: Curtis Sites Primary Care Physician: [facility] [place] Other Clinician: Referring Physician: [facility] [place] Treating Physician/Extender: Rudene Re in Treatment: 71 Wound Status Wound Number: 2 Primary Venous Leg Ulcer Etiology: Wound Location: Right Malleolus - Medial Wound Open Wounding Event: Gradually Appeared Status: Date Acquired: 01/30/2016 Comorbid Cataracts, Chronic Obstructive Weeks Of Treatment: 31 History: Pulmonary Disease (COPD), Clustered Wound: Yes Osteoarthritis Wound Measurements Length: (cm) 4.2 Width: (cm) 3.3 Depth: (cm) 0.2 Area: (cm) 10.886 Volume: (cm) 2.177 % Reduction in Area: 5.7% % Reduction in Volume: -88.5% Epithelialization: None Tunneling: No Undermining: No Wound Description Full Thickness Without Exposed Classification: Support Structures Exudate Large Amount: Exudate Type: Purulent Exudate Color: yellow, brown, green Foul Odor After Cleansing: No Wound Bed Granulation Amount: None Present (0%) Exposed Structure Necrotic Amount: Large (67-100%) Fascia Exposed: No Necrotic Quality: Adherent Slough Fat Layer Exposed: No Tendon Exposed: No Muscle Exposed: No Joint Exposed: No Bone Exposed: No Limited to Skin Breakdown Periwound Skin Texture Texture Color No Abnormalities Noted: No No Abnormalities Noted: No Moisture Temperature / Pain No Abnormalities Noted: No Temperature: No Abnormality Hockey, Nesanel C. ([id number]) Moist: Yes Tenderness on Palpation: Yes Wound Preparation Ulcer Cleansing: Rinsed/Irrigated with Saline, Other: soap and water, Topical Anesthetic Applied: None Treatment  Notes Wound #2 (Right, Medial Malleolus) 1. Cleansed with: Clean wound with Normal Saline Cleanse wound with antibacterial soap and water 4. Dressing Applied: Iodoflex 5. Secondary Dressing Applied ABD Pad Dry Gauze 7. Secured with Tape 3 Layer Compression System - Right Lower Extremity Notes unna to anchor, xtrasorb Electronic Signature(s) Signed: 09/07/2016 5:10:42 PM By: Curtis Sites Entered By: Curtis Sites on 09/07/2016 14:23:40

## 2016-09-10 ENCOUNTER — Other Ambulatory Visit: Payer: Self-pay | Admitting: Internal Medicine

## 2016-09-10 DIAGNOSIS — I872 Venous insufficiency (chronic) (peripheral): Secondary | ICD-10-CM

## 2016-09-10 DIAGNOSIS — L97919 Non-pressure chronic ulcer of unspecified part of right lower leg with unspecified severity: Secondary | ICD-10-CM

## 2016-09-11 ENCOUNTER — Encounter: Payer: Medicare HMO | Admitting: Internal Medicine

## 2016-09-11 ENCOUNTER — Ambulatory Visit (INDEPENDENT_AMBULATORY_CARE_PROVIDER_SITE_OTHER): Payer: Medicare HMO

## 2016-09-11 ENCOUNTER — Ambulatory Visit (INDEPENDENT_AMBULATORY_CARE_PROVIDER_SITE_OTHER): Payer: Medicare HMO | Admitting: Vascular Surgery

## 2016-09-11 ENCOUNTER — Encounter (INDEPENDENT_AMBULATORY_CARE_PROVIDER_SITE_OTHER): Payer: Self-pay | Admitting: Vascular Surgery

## 2016-09-11 VITALS — BP 162/76 | HR 103 | Resp 16 | Ht 76.0 in | Wt 228.6 lb

## 2016-09-11 DIAGNOSIS — L97919 Non-pressure chronic ulcer of unspecified part of right lower leg with unspecified severity: Secondary | ICD-10-CM | POA: Diagnosis not present

## 2016-09-11 DIAGNOSIS — M7989 Other specified soft tissue disorders: Secondary | ICD-10-CM

## 2016-09-11 DIAGNOSIS — I872 Venous insufficiency (chronic) (peripheral): Secondary | ICD-10-CM

## 2016-09-11 DIAGNOSIS — I83019 Varicose veins of right lower extremity with ulcer of unspecified site: Secondary | ICD-10-CM

## 2016-09-11 DIAGNOSIS — L97909 Non-pressure chronic ulcer of unspecified part of unspecified lower leg with unspecified severity: Secondary | ICD-10-CM

## 2016-09-11 DIAGNOSIS — I87331 Chronic venous hypertension (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: Secondary | ICD-10-CM | POA: Diagnosis not present

## 2016-09-11 DIAGNOSIS — I83009 Varicose veins of unspecified lower extremity with ulcer of unspecified site: Secondary | ICD-10-CM | POA: Insufficient documentation

## 2016-09-11 DIAGNOSIS — L97312 Non-pressure chronic ulcer of right ankle with fat layer exposed: Secondary | ICD-10-CM | POA: Diagnosis not present

## 2016-09-11 NOTE — Assessment & Plan Note (Signed)
The patient has a nonhealing ulceration of the right leg now for about 3 months. His venous duplex today demonstrates right small saphenous vein reflux and a large right great saphenous vein branch with reflux. Given these findings, the patient would benefit from laser ablation of the small saphenous vein as well as the right great saphenous vein and branch of it is technically feasible. I have discussed the risks and benefits of the procedure. I have discussed the reason and rationale for treatment of venous disease particularly with nonhealing ulcerations. I discussed the high-risk of recurrence of the ulceration of his venous disease is not addressed. The patient voices his understanding and is agreeable to proceed with our plan of care.

## 2016-09-11 NOTE — Assessment & Plan Note (Signed)
Likely related to venous insufficiency although lymphedema from his previous injury could certainly be contributing as well. Laser ablation as planned below as well as compression and elevation.
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
## 2016-09-11 NOTE — Assessment & Plan Note (Signed)
Currently dressed in [employer] boots today. Has been present for several months. Associated venous insufficiency as below and would benefit from laser ablation therapy.

## 2016-09-14 ENCOUNTER — Ambulatory Visit: Payer: Medicare HMO

## 2016-09-14 NOTE — Progress Notes (Signed)
[name], [name] ([id number]) Visit Report for 09/11/2016 Arrival Information Details [name], [name] Date of Service: 09/11/2016 12:30 PM Patient Name: C. Patient Account Number: [id number] Medical Record Treating RN: [name] [id number] Number: Other Clinician: Date of Birth/Sex: June 15, 1946 [age] y.o. Male) Treating Baltazar NajjarOBSON, [name] [place] Physician/Extender: G Referring Physician: [facility], [place] Weeks in Treatment: 2734 Visit Information History Since Last Visit Added or deleted any medications: No Patient Arrived: Ambulatory Any new allergies or adverse reactions: No Arrival Time: 12:40 Had a fall or experienced change in No Accompanied By: wife activities of daily living that may affect Transfer Assistance: None risk of falls: Patient Identification Verified: Yes Signs or symptoms of abuse/neglect since last No Secondary Verification Process Yes visito Completed: Hospitalized since last visit: No Patient Requires Transmission-Based No Has Dressing in Place as Prescribed: Yes Precautions: Has Compression in Place as Prescribed: Yes Patient Has Alerts: No Pain Present Now: No Electronic Signature(s) Signed: 09/14/2016 2:39:52 PM By: [name], Kim RN, [name] Entered By: [name] on 09/11/2016 12:41:17 [name], [name] ([id number]) -------------------------------------------------------------------------------- Encounter Discharge Information Details [name], [name] Date of Service: 09/11/2016 12:30 PM Patient Name: C. Patient Account Number: [id number] Medical Record Treating RN: [name] [id number] Number: Other Clinician: Date of Birth/Sex: June 15, 1946 [age] y.o. Male) Treating Baltazar NajjarOBSON, [name] [place] Physician/Extender: G Referring Physician: [facility] [place] Weeks in Treatment: 4934 Encounter Discharge Information Items Discharge Pain Level: 0 Discharge Condition: Stable Ambulatory Status:  Ambulatory Discharge Destination: Home Transportation: Private Auto Accompanied By: wife in lobby Schedule Follow-up Appointment: Yes Medication Reconciliation completed Yes and provided to Patient/Care Lemon Sternberg: Patient Clinical Summary of Care: Declined Electronic Signature(s) Signed: 09/11/2016 1:43:40 PM By: Gwenlyn PerkingMoore, Shelia Entered By: Gwenlyn PerkingMoore, Shelia on 09/11/2016 13:43:40 [name], [name] ([id number]) -------------------------------------------------------------------------------- Lower Extremity Assessment Details [name], [name] Date of Service: 09/11/2016 12:30 PM Patient Name: C. Patient Account Number: [id number] Medical Record Treating RN: [name] [id number] Number: Other Clinician: Date of Birth/Sex: June 15, 1946 (70 y.o. Male) Treating Baltazar NajjarOBSON, [name] [place] Physician/Extender: G Referring Physician: [facility] [place] Weeks in Treatment: 4934 Vascular Assessment Claudication: Claudication Assessment [Right:None] Pulses: Dorsalis Pedis Palpable: [Right:Yes] Posterior Tibial Extremity colors, hair growth, and conditions: Extremity Color: [Right:Hyperpigmented] Hair Growth on Extremity: [Right:Yes] Temperature of Extremity: [Right:Warm] Capillary Refill: [Right:< 3 seconds] Dependent Rubor: [Right:No] Blanched when Elevated: [Right:No] Lipodermatosclerosis: [Right:No] Toe Nail Assessment Left: Right: Thick: Yes Discolored: Yes Deformed: No Improper Length and Hygiene: No Electronic Signature(s) Signed: 09/14/2016 2:39:52 PM By: [name], Kim RN, [name] Entered By: [name] on 09/11/2016 12:54:27 [name], [name] ([id number]) -------------------------------------------------------------------------------- Multi Wound Chart Details [name], [name] Date of Service: 09/11/2016 12:30 PM Patient Name: C. Patient Account Number: [id number] Medical Record Treating RN: [name] [id number] Number: Other  Clinician: Date of Birth/Sex: June 15, 1946 [age] y.o. Male) Treating Baltazar NajjarOBSON, [name] [place] Physician/Extender: G Referring Physician: [facility] [place] Weeks in Treatment: 34 Vital Signs Height(in): 76 Pulse(bpm): Weight(lbs): 238 Blood Pressure 148/82 (mmHg): Body Mass Index(BMI): 29 Temperature(F): 98.6 Respiratory Rate 18 (breaths/min): Photos: [N/A:N/A] Wound Location: Right Malleolus - Lateral Right Malleolus - Medial N/A Wounding Event: Gradually Appeared Gradually Appeared N/A Primary Etiology: Venous Leg Ulcer Venous Leg Ulcer N/A Comorbid History: Cataracts, Chronic Cataracts, Chronic N/A Obstructive Pulmonary Obstructive Pulmonary Disease (COPD), Disease (COPD), Osteoarthritis Osteoarthritis Date Acquired: 08/11/2015 01/30/2016 N/A Weeks of Treatment: 34 32 N/A Wound Status: Open Open N/A Clustered Wound: No Yes N/A Measurements L x W x D 6.5x6x0.4 4x2.8x0.2  N/A (cm) Area (cm) : 30.631 8.796 N/A Volume (cm) : 12.252 1.759 N/A % Reduction in Area: -77.30% 23.80% N/A % Reduction in Volume: -136.30% -52.30% N/A Classification: Full Thickness Without Full Thickness Without N/A Exposed Support Exposed Support Structures Structures Exudate Amount: Large Large N/A Exudate Type: Serous Serous N/A Exudate Color: amber amber N/A [name] ([id number]) Foul Odor After Yes No N/A Cleansing: Odor Anticipated Due to No N/A N/A Product Use: Wound Margin: Thickened Thickened N/A Granulation Amount: Small (1-33%) Small (1-33%) N/A Granulation Quality: Pink, Hyper-granulation Pink, Hyper-granulation N/A Necrotic Amount: Large (67-100%) Large (67-100%) N/A Exposed Structures: Fat: Yes Fat: Yes N/A Fascia: No Fascia: No Tendon: No Tendon: No Muscle: No Muscle: No Joint: No Joint: No Bone: No Bone: No Epithelialization: None None N/A Debridement: Debridement ([id number]- N/A N/A 11047) Pre-procedure 13:10 N/A  N/A Verification/Time Out Taken: Pain Control: Other N/A N/A Tissue Debrided: Fibrin/Slough, Fat, N/A N/A Subcutaneous Level: Skin/Subcutaneous N/A N/A Tissue Debridement Area (sq 39 N/A N/A cm): Instrument: Curette N/A N/A Bleeding: Moderate N/A N/A Hemostasis Achieved: Pressure N/A N/A Procedural Pain: 3 N/A N/A Post Procedural Pain: 3 N/A N/A Debridement Treatment Procedure was tolerated N/A N/A Response: well Post Debridement 6.5x6x0.5 N/A N/A Measurements L x W x D (cm) Post Debridement 15.315 N/A N/A Volume: (cm) Periwound Skin Texture: Scarring: Yes Scarring: Yes N/A Edema: No Excoriation: No Induration: No Callus: No Crepitus: No Fluctuance: No Friable: No Rash: No Periwound Skin Moist: Yes N/A Moisture: JAQUEZ, FARRINGTON ([id number]) Maceration: Yes Moist: Yes Dry/Scaly: No Periwound Skin Color: Hemosiderin Staining: Yes Hemosiderin Staining: Yes N/A Atrophie Blanche: No Cyanosis: No Ecchymosis: No Erythema: No Mottled: No Pallor: No Rubor: No Temperature: No Abnormality No Abnormality N/A Tenderness on Yes Yes N/A Palpation: Wound Preparation: Ulcer Cleansing: Ulcer Cleansing: N/A Rinsed/Irrigated with Rinsed/Irrigated with Saline, Other: soap and Saline, Other: soap and water water Topical Anesthetic Topical Anesthetic Applied: None Applied: None, Other: lidocaine 4% Procedures Performed: Debridement N/A N/A Treatment Notes Wound #1 (Right, Lateral Malleolus) 1. Cleansed with: Clean wound with Normal Saline Cleanse wound with antibacterial soap and water 4. Dressing Applied: Iodoflex 5. Secondary Dressing Applied ABD Pad 7. Secured with Tape 3 Layer Compression System - Right Lower Extremity Notes unna to anchor, xtrasorb Wound #2 (Right, Medial Malleolus) 1. Cleansed with: Clean wound with Normal Saline Cleanse wound with antibacterial soap and water 4. Dressing Applied: Iodoflex 5. Secondary Dressing Applied ABD  Pad 7. Secured with SKIPPY, MARHEFKA ([id number]) Tape 3 Layer Compression System - Right Lower Extremity Notes unna to anchor, xtrasorb Electronic Signature(s) Signed: 09/12/2016 7:43:54 AM By: [name] MD Entered By: [name] on 09/11/2016 14:01:10 [name] Stack ([id number]) -------------------------------------------------------------------------------- Multi-Disciplinary Care Plan Details [name] Ringer Date of Service: 09/11/2016 12:30 PM Patient Name: C. Patient Account Number: [id number] Medical Record Treating RN: [name] Coventry [id number] Number: Other Clinician: Date of Birth/Sex: February 18, 1946 (70 y.o. Male) Treating [name] Primary Care Physician: [facility], [place] Physician/Extender: G Referring Physician: [facility] Weeks in Treatment: 65 Active Inactive Venous Leg Ulcer Nursing Diagnoses: Knowledge deficit related to disease process and management Goals: Patient will maintain optimal edema control Date Initiated: 04/03/2016 Goal Status: Active Interventions: Compression as ordered Treatment Activities: Therapeutic compression applied : 04/03/2016 Notes: Wound/Skin Impairment Nursing Diagnoses: Impaired tissue integrity Goals: Ulcer/skin breakdown will heal within 14 weeks Date Initiated: 01/17/2016 Goal Status: Active Interventions: Assess ulceration(s) every visit Notes: Electronic Signature(s) Signed: 09/14/2016 2:39:52 PM By: [name] Gurney, RN, [name], Kim RN, [name] [name], Cutler C. ([id number]) Entered  By: [name] Gurney, RN, [name], [name] on 09/11/2016 12:54:33 [name] Stack ([id number]) -------------------------------------------------------------------------------- Pain Assessment Details [name] Ringer Date of Service: 09/11/2016 12:30 PM Patient Name: C. Patient Account Number: [id number] Medical Record Treating RN: [name] Coventry [id number] Number: Other Clinician: Date of Birth/Sex: April 26, 1946 (70 y.o. Male) Treating [name] Primary Care Physician: [facility], [place] Physician/Extender: G Referring Physician: [facility] [place] Weeks in Treatment: 5 Active Problems Location of Pain Severity and Description of Pain Patient Has Paino No Site Locations With Dressing Change: No Pain Management and Medication Current Pain Management: Electronic Signature(s) Signed: 09/14/2016 2:39:52 PM By: [name] Gurney, RN, [name], Kim RN, [name] Entered By: [name] Gurney, RN, [name], [name] on 09/11/2016 12:41:26 [name] Stack ([id number]) -------------------------------------------------------------------------------- Patient/Caregiver Education Details [name] Ringer Date of Service: 09/11/2016 12:30 PM Patient Name: C. Patient Account Number: [id number] Medical Record Treating RN: [name] Coventry [id number] Number: Other Clinician: Date of Birth/Gender: 1946-05-17 (70 y.o. Male) Treating [name] Primary Care Physician: [facility], [place] Physician/Extender: G Referring Physician: [facility] [place] Weeks in Treatment: 98 Education Assessment Education Provided To: Patient Education Topics Provided Venous: Handouts: Controlling Swelling with Multilayered Compression Wraps Methods: Demonstration, Explain/Verbal Responses: State content correctly Wound/Skin Impairment: Handouts: Caring for Your Ulcer Methods: Demonstration, Explain/Verbal Responses: State content correctly Electronic Signature(s) Signed: 09/14/2016 2:39:52 PM By: [name] Gurney, RN, [name], Kim RN, [name] Entered By: [name] Gurney, RN, [name], [name] on 09/11/2016 13:33:38 [name] Stack ([id number]) -------------------------------------------------------------------------------- Wound Assessment Details [name] Ringer Date of Service: 09/11/2016 12:30 PM Patient Name: C. Patient Account Number: [id number] Medical Record Treating RN: [name] Coventry [id number] Number: Other Clinician: Date of Birth/Sex: 1945/09/24 (70 y.o. Male) Treating [name] Primary Care Physician: [facility],  [place] Physician/Extender: G Referring Physician: [facility] [place] Weeks in Treatment: 34 Wound Status Wound Number: 1 Primary Venous Leg Ulcer Etiology: Wound Location: Right Malleolus - Lateral Wound Open Wounding Event: Gradually Appeared Status: Date Acquired: 08/11/2015 Comorbid Cataracts, Chronic Obstructive Weeks Of Treatment: 34 History: Pulmonary Disease (COPD), Clustered Wound: No Osteoarthritis Photos Wound Measurements Length: (cm) 6.5 Width: (cm) 6 Depth: (cm) 0.4 Area: (cm) 30.631 Volume: (cm) 12.252 % Reduction in Area: -77.3% % Reduction in Volume: -136.3% Epithelialization: None Tunneling: No Undermining: No Wound Description Full Thickness Without Exposed Classification: Support Structures Wound Margin: Thickened Exudate Large Amount: Exudate Type: Serous Exudate Color: amber Foul Odor After Cleansing: Yes Due to Product Use: No Wound Bed Granulation Amount: Small (1-33%) Exposed Structure Granulation Quality: Pink, Hyper-granulation Fascia Exposed: No Necrotic Amount: Large (67-100%) Fat Layer Exposed: Yes DEJOHN, IBARRA ([id number]) Necrotic Quality: Adherent Slough Tendon Exposed: No Muscle Exposed: No Joint Exposed: No Bone Exposed: No Periwound Skin Texture Texture Color No Abnormalities Noted: No No Abnormalities Noted: No Callus: No Atrophie Blanche: No Crepitus: No Cyanosis: No Excoriation: No Ecchymosis: No Fluctuance: No Erythema: No Friable: No Hemosiderin Staining: Yes Induration: No Mottled: No Localized Edema: No Pallor: No Rash: No Rubor: No Scarring: Yes Temperature / Pain Moisture Temperature: No Abnormality No Abnormalities Noted: No Tenderness on Palpation: Yes Dry / Scaly: No Maceration: Yes Moist: Yes Wound Preparation Ulcer Cleansing: Rinsed/Irrigated with Saline, Other: soap and water, Topical Anesthetic Applied: None Treatment Notes Wound #1 (Right, Lateral Malleolus) 1. Cleansed  with: Clean wound with Normal Saline Cleanse wound with antibacterial soap and water 4. Dressing Applied: Iodoflex 5. Secondary Dressing Applied ABD Pad 7. Secured with Tape 3 Layer Compression System - Right Lower Extremity Notes unna to anchor, [employer] operational [employer]) Signed: 09/14/2016 2:39:52 PM By: [name] Gurney, RN, [name], Kim RN, [name] Entered By: [name] Gurney, RN, [name], [name]  on 09/11/2016 12:53:46 ZION, TA ([id number]) ECHO, [place] ([id number]) -------------------------------------------------------------------------------- Wound Assessment Details [name] Ringer Date of Service: 09/11/2016 12:30 PM Patient Name: C. Patient Account Number: [id number] Medical Record Treating RN: [name] Coventry [id number] Number: Other Clinician: Date of Birth/Sex: [birth year] (70 y.o. Male) Treating [name] Primary Care Physician: [facility], [place] Physician/Extender: G Referring Physician: [facility] Weeks in Treatment: 34 Wound Status Wound Number: 2 Primary Venous Leg Ulcer Etiology: Wound Location: Right Malleolus - Medial Wound Open Wounding Event: Gradually Appeared Status: Date Acquired: 01/30/2016 Comorbid Cataracts, Chronic Obstructive Weeks Of Treatment: 32 History: Pulmonary Disease (COPD), Clustered Wound: Yes Osteoarthritis Photos Wound Measurements Length: (cm) 4 Width: (cm) 2.8 Depth: (cm) 0.2 Area: (cm) 8.796 Volume: (cm) 1.759 % Reduction in Area: 23.8% % Reduction in Volume: -52.3% Epithelialization: None Tunneling: No Undermining: No Wound Description Full Thickness Without Exposed Classification: Support Structures Wound Margin: Thickened Exudate Large Amount: Exudate Type: Serous Exudate Color: amber Foul Odor After Cleansing: No Wound Bed Granulation Amount: Small (1-33%) Exposed Structure Granulation Quality: Pink, Hyper-granulation Fascia Exposed: No Necrotic Amount: Large (67-100%) Fat Layer Exposed: Yes DEMORRIS, CHOYCE ([id number]) Necrotic Quality: Adherent Slough Tendon Exposed: No Muscle Exposed: No Joint Exposed: No Bone Exposed: No Periwound Skin Texture Texture Color No Abnormalities Noted: No No Abnormalities Noted: No Scarring: Yes Hemosiderin Staining: Yes Moisture Temperature / Pain No Abnormalities Noted: No Temperature: No Abnormality Moist: Yes Tenderness on Palpation: Yes Wound Preparation Ulcer Cleansing: Rinsed/Irrigated with Saline, Other: soap and water, Topical Anesthetic Applied: None, Other: lidocaine 4%, Treatment Notes Wound #2 (Right, Medial Malleolus) 1. Cleansed with: Clean wound with Normal Saline Cleanse wound with antibacterial soap and water 4. Dressing Applied: Iodoflex 5. Secondary Dressing Applied ABD Pad 7. Secured with Tape 3 Layer Compression System - Right Lower Extremity Notes unna to anchor, [employer] forces operational [employer]) Signed: 09/14/2016 2:39:52 PM By: [name] Gurney, RN, [name], Kim RN, [name] Entered By: [name] Gurney, RN, [name], [name] on 09/11/2016 12:53:18 [name] Stack ([id number]) -------------------------------------------------------------------------------- Vitals Details [name] Ringer Date of Service: 09/11/2016 12:30 PM Patient Name: C. Patient Account Number: [id number] Medical Record Treating RN: [name] Coventry [id number] Number: Other Clinician: Date of Birth/Sex: [birth year] (70 y.o. Male) Treating [name], [name] Primary Care Physician: [facility], [place] Physician/Extender: G Referring Physician: [facility] [place] Weeks in Treatment: 75 Vital Signs Time Taken: 12:41 Temperature (F): 98.6 Height (in): 76 Respiratory Rate (breaths/min): 18 Weight (lbs): 238 Blood Pressure (mmHg): 148/82 Body Mass Index (BMI): 29 Reference Range: 80 - 120 mg / dl Electronic Signature(s) Signed: 09/14/2016 2:39:52 PM By: [name] Gurney, RN, [name], Kim RN, [name] Entered By: [name] Gurney, RN, [name], [name] on 09/11/2016 12:43:29

## 2016-09-14 NOTE — Progress Notes (Signed)
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 Currently he tells me that the pain is doing very well about a 1 out of 10 today. Yesterday was a little bit worse but he tells me that he was more active watering his flowers that day. Overall he feels that his symptoms are doing significantly better at this point in time. His edema continues to be controlled well with the 4-layer compression wrap and he really has not noted any odor at this point in time. He is tolerating the dressing changes when they are performed well. 06/05/16 at this point in time today patient currently shows no interval signs or symptoms of local or systemic infection. Again his pain level he rates to be a 1 out of 10 at most and overall he tells me that generally this is not giving him much trouble. In fact he even feels maybe a little bit better than last week. We have continue with the 4-layer compression wrap in which she tolerates very well at this point. He is continuing to utilize the [place]. 06/12/16 I think there has been some progression in the status  of both of these wounds over today again covered in a gelatinous surface. Has been using Hydrofera Blue. We had used Iodoflex in the past I'm not sure if there was an issue other than changing to something that might progress towards closure faster 06/19/16; he did not tolerate the Flexeril last week secondary to pain and this was changed on Friday back to [facility] area he continues to have copious amounts of gelatinous surface slough which is think inhibiting the speed of healing this area 06/26/16 patient over the last week has utilized the Santyl to try to loosen up some of the tightly adherent slough that was noted on evaluation last week. The good news is he tells me that the medial malleoli region really does not bother him the right llateral malleoli region is more tender to palpation at this point in time especially in the central/inferior location. However it does appear that the Santyl has done his job to loosen up the adherent slough at this point in time. Fortunately he has no interval signs or symptoms of infection locally or systemically no purulent discharge noted. 07/03/16 at this point in time today patient's wounds appear to be significantly improved over the right medial and lateral malleolus locations. He has much less tenderness at this point in time and the wounds  appear clean her although there is still adherent slough this is sufficiently improved over what I saw last week. I still see no evidence of local infection. 07/10/16; continued gradual improvement in the right medial and lateral malleolus locations. The lateral is more substantial wound now divided into 2 by a rim of normal epithelialization. 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 He denies any complaints of pain or intolerance to compression. Both ulcers are smaller in size oriented  today's measurements, both are covered with a softly adherent slough. [name], [name] ([id number]) 07/24/16; medial wound is smaller, lateral about the same although surface looks better. Still using Hydrofera Blue 07/31/16; arrives today complaining of pain in the lateral part of his foot. Nurse reports a lot more drainage. He has been using Hydrofera Blue. Switch to silver alginate today 08/03/2016 -- I was asked to see the patient was here for a nurse visit today. I understand he had a lot of pain in his right lower extremity and was having blisters on his right foot which have not been there before. Though he started on doxycycline he does not have blisters elsewhere on his body. I do not believe this is a drug allergy. also mentioned that there was a copious purulent discharge from the wound and clinically there is no evidence of cellulitis. 08/07/16; I note that the patient came in for his nurse check on Friday apparently with blisters on his toes on the right than a lot of swelling in his forefoot. He continued on the doxycycline that I had prescribed on 12/8. A culture was done of the lateral wound that showed a combination of a few Proteus and Pseudomonas. Doxycycline might of covered the Proteus but would be unlikely to cover the Pseudomonas. He is on Coumadin. He arrives in the clinic today feeling a lot better states the pain is a lot better but nothing specific really was done other than to rewrap the foot also noted that he had arterial studies ordered in August although these were never done. It is reasonable to go ahead and reorder these. 08/14/16; generally arrives in a better state today in terms of the wounds he has taken cefdinir for one week. Our intake nurse reports copious amounts of drainage but the patient is complaining of much less pain. He is not had his PT and INR checked and I've asked him to do this today or tomorrow. 08/24/2016 -- patient arrives today after  10 days and said he had a stomach upset. His arterial study was done and I have reviewed this report and find it to be within normal limits. However I did not note any venous duplex studies for reflux, and Dr. Leanord Hawking may have ordered these in the past but I will leave it to him to decide if he needs these. The patient has finished his course of cefdinir. 08/28/16; patient arrives today again with copious amounts of thick really green drainage for our intake nurse. He states he has a very tender spot at the superior part of the lateral wound. Wounds are larger 09/04/16; no real change in the condition of this patient's wound still copious amounts of surface slough. Started him on Iodoflex last week he is completing another course of Cefdinir or which I think was done empirically. His arterial study showed ABIs were 1.1 on the right 1.5 on the left. He did have a slightly  reduced ABI in the right the left one was not obtained. Had calcification of the right posterior tibial artery. The interpretation was no segmental stenosis. His waveforms were triphasic. His reflux studies are later this month. Depending on this I'll send him for a vascular consultation, he may need to see plastic surgery as I believe he is had plastic surgery on this foot in the past. He had an injury to the foot in the 1980s. 1/16 /18 right lateral greater than right medial ankle wounds on the right in the setting of previous skin grafting. 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 He does not have arterial issues. Each week he comes in with the same adherent surface slough although there was less of this today Objective Constitutional Patient is hypertensive.. Pulse regular and within target range for patient.Marland Kitchen Respirations regular, non-labored and within target range.. Temperature is normal and within the target range for the patient..  Patient's BRONISLAW, SWITZER ([id number]) appearance is neat and clean. Appears in no acute distress. Well nourished and well developed.. Vitals Time Taken: 12:41 PM, Height: 76 in, Weight: 238 lbs, BMI: 29, Temperature: 98.6 F, Respiratory Rate: 18 breaths/min, Blood Pressure: 148/82 mmHg. Cardiovascular Pedal pulses palpable and strong bilaterally.. The patient has no edema. General Notes: Wound exam; wounds look unchanged however the dimensions are slightly smaller per intake nurse today. Patient is complaining of a lot of pain in the superior aspect of the wound but I see no evidence of infection. The surface slough and acrotic tissue is easier to debridement today with an open curette underneath this the tissue looks quite healthy however I simply cannot get this to maintain from week to week. Integumentary (Hair, Skin) Wound #1 status is Open. Original cause of wound was Gradually Appeared. The wound is located on the Right,Lateral Malleolus. The wound measures 6.5cm length x 6cm width x 0.4cm depth; 30.631cm^2 area and 12.252cm^3 volume. There is fat exposed. There is no tunneling or undermining noted. There is a large amount of serous drainage noted. The wound margin is thickened. There is small (1-33%) pink granulation within the wound bed. There is a large (67-100%) amount of necrotic tissue within the wound bed including Adherent Slough. The periwound skin appearance exhibited: Scarring, Maceration, Moist, Hemosiderin Staining. The periwound skin appearance did not exhibit: Callus, Crepitus, Excoriation, Fluctuance, Friable, Induration, Localized Edema, Rash, Dry/Scaly, Atrophie Blanche, Cyanosis, Ecchymosis, Mottled, Pallor, Rubor, Erythema. Periwound temperature was noted as No Abnormality. The periwound has tenderness on palpation. Wound #2 status is Open. Original cause of wound was Gradually Appeared. The wound is located on the Right,Medial Malleolus. The wound measures  4cm length x 2.8cm width x 0.2cm depth; 8.796cm^2 area and 1.759cm^3 volume. There is fat exposed. There is no tunneling or undermining noted. There is a large amount of serous drainage noted. The wound margin is thickened. There is small (1-33%) pink granulation within the wound bed. There is a large (67-100%) amount of necrotic tissue within the wound bed including Adherent Slough. The periwound skin appearance exhibited: Scarring, Moist, Hemosiderin Staining. Periwound temperature was noted as No Abnormality. The periwound has tenderness on palpation. 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 ([id number]) Procedures Wound #1 Wound #1 is a Venous Leg Ulcer located on the Right,Lateral Malleolus . There was a Skin/Subcutaneous Tissue Debridement ([id number]) debridement with total area of 39 sq cm performed by [name], MD. with the following instrument(s): Curette to remove Viable and Non-Viable tissue/material including Fat, Fibrin/Slough, and Subcutaneous after achieving pain control using Other (lidocaine 4%). A time out was conducted at 13:10, prior to the start of the procedure. A Moderate amount of bleeding was controlled with Pressure. The procedure was tolerated well with a pain level of 3 throughout and a pain level of 3 following the procedure. Post Debridement Measurements: 6.5cm length x 6cm width x 0.5cm depth; 15.315cm^3 volume. Character of Wound/Ulcer Post Debridement requires further debridement. Severity of Tissue Post Debridement is: Fat layer exposed. Post procedure Diagnosis Wound #1: Same as  Pre-Procedure Plan Wound Cleansing: Wound #1 Right,Lateral Malleolus: Cleanse wound with mild soap and water May shower with protection. No tub bath. Wound #2 Right,Medial Malleolus: Cleanse wound with mild soap and water May shower with protection. No tub bath. Skin Barriers/Peri-Wound Care: Wound #1 Right,Lateral Malleolus: Barrier cream - zinc oxide Triamcinolone Acetonide Ointment Wound #2 Right,Medial Malleolus: Barrier cream - zinc oxide Triamcinolone Acetonide Ointment Primary Wound Dressing: Wound #1 Right,Lateral Malleolus: Iodoflex Wound #2 Right,Medial Malleolus: Iodoflex Secondary Dressing: Wound #1 Right,Lateral Malleolus: ABD pad Dry Gauze Hedlund, Jceon C. ([id number]) XtraSorb Wound #2 Right,Medial Malleolus: ABD pad Dry Gauze Drawtex Follow-up Appointments: Wound #1 Right,Lateral Malleolus: Return Appointment in 1 week. Other: - nurse visit Friday Wound #2 Right,Medial Malleolus: Return Appointment in 1 week. Other: - nurse visit Friday Edema Control: Wound #1 Right,Lateral Malleolus: 3 Layer Compression System - Right Lower Extremity - UNNA TO ANCHOR Elevate legs to the level of the heart and pump ankles as often as possible Wound #2 Right,Medial Malleolus: 3 Layer Compression System - Right Lower Extremity - UNNA TO ANCHOR Elevate legs to the level of the heart and pump ankles as often as possible Additional Orders / Instructions: Wound #1 Right,Lateral Malleolus: Increase protein intake. OK to return to work with the following restrictions: Activity as tolerated Wound #2 Right,Medial Malleolus: Increase protein intake. OK to return to work with the following restrictions: Activity as tolerated Medications-please add to medication list.: Wound #1 Right,Lateral Malleolus: P.O. Antibiotics - Cefidinir Other: - Vitamin C, Zinc, Multivitamin Wound #2 Right,Medial Malleolus: P.O. Antibiotics - continue Cefidinir Other: - Vitamin C, Zinc,  Multivitamin #1 continue with the Iodoflex #2 await vein ablations in the next week to 2 #3 I'm thinking that he may need further skin grafting over these wound areas or perhaps a trip to the OR for operative debridement and a ACEL placement #4 as I remember things we could not order him a skin substitute since his co-pay was beyond is financial means however in any case I think the wound area here is beyond any of the applicable agents we use HALL, BIRCHARD ([id number]) Electronic Signature(s) Signed: 09/12/2016 7:43:54 AM By: [name] MD Entered By: [name] on 09/11/2016 14:06:47 [name] Stack ([id number]) -------------------------------------------------------------------------------- SuperBill Details [name] Ringer Date of Service: 09/11/2016 Patient Name: C. Patient Account Number: [id number] Medical Record Treating RN: Huel Coventry [id number] Number: Other Clinician: Date of Birth/Sex: 10/20/1945 (70 y.o. 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, initial T85.613A encounter Facility Procedures CPT4: Description Modifier Quantity Code [id number] 11042 - DEB SUBQ TISSUE 20 SQ CM/< 1 ICD-10 Description Diagnosis I87.331 Chronic venous hypertension (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 CPT4: 62130865 11045 - DEB SUBQ TISS EA ADDL 20CM 1 ICD-10 Description Diagnosis I87.331 Chronic venous hypertension (idiopathic) with ulcer and inflammation of right lower  extremity Physician Procedures CPT4: Description Modifier Quantity Code [id number] 11042 - WC PHYS SUBQ TISS 20 SQ CM 1 ICD-10 Description Diagnosis I87.331 Chronic venous hypertension (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 ESAU, FRIDMAN ([id number]) Electronic Signature(s) Signed: 09/12/2016 7:43:54 AM By: [name] MD Entered By: [name] on 09/11/2016 14:07:36

## 2016-09-18 ENCOUNTER — Encounter: Payer: Medicare HMO | Admitting: Internal Medicine

## 2016-09-18 DIAGNOSIS — I87331 Chronic venous hypertension (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: Secondary | ICD-10-CM | POA: Diagnosis not present

## 2016-09-19 ENCOUNTER — Encounter (INDEPENDENT_AMBULATORY_CARE_PROVIDER_SITE_OTHER): Payer: Self-pay

## 2016-09-19 NOTE — Progress Notes (Addendum)
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 tolerating the dressing changes when they are performed well. 06/05/16 at this point in time today patient currently shows no interval signs or symptoms of local or systemic infection. Again his pain level he rates to be a 1 out of 10 at most and overall he tells me that generally this is not giving him much trouble. In fact he even feels maybe a little bit better than last week. We have continue with the 4-layer compression wrap in which she tolerates very well at this point. He is continuing to utilize the [place]. 06/12/16 I think there has been some progression in the status of both of these wounds over today again covered in a gelatinous surface. Has been using Hydrofera Blue. We had used Iodoflex in the past I'm not sure if there was an issue other than changing to something that might progress towards closure faster 06/19/16; he did not tolerate the Flexeril last week secondary to pain and this was changed on Friday back to [facility] area he continues to have copious amounts of gelatinous surface slough which is think inhibiting the speed of healing this area 06/26/16 patient over the last week has utilized the Santyl to try to loosen  up some of the tightly adherent slough that was noted on evaluation last week. The good news is he tells me that the medial malleoli region really does not bother him the right llateral malleoli region is more tender to palpation at this point in time especially in the central/inferior location. However it does appear that the Santyl has done his job to loosen up the adherent slough at this point in time. Fortunately he has no interval signs or symptoms of infection locally or systemically no purulent discharge noted. 07/03/16 at this point in time today patient's wounds appear to be significantly improved over the right medial and lateral malleolus locations. He has much less tenderness at this point in time and the wounds appear clean her although there is still adherent slough this is sufficiently improved over what I saw last week. I still see no evidence of local infection. 07/10/16; continued gradual improvement in the right medial and lateral malleolus locations. The lateral is more substantial wound now divided into 2 by a rim of normal epithelialization. Both areas have adherent surface slough and nonviable subcutaneous tissue 07-17-16- He continues to have progress to his right  medial and lateral malleolus ulcers. He denies any complaints of pain or intolerance to compression. Both ulcers are smaller in size oriented today's measurements, both are covered with a softly adherent slough. 07/24/16; medial wound is smaller, lateral about the same although surface looks better. Still using [name], [name] ([id number]) Hydrofera Blue 07/31/16; arrives today complaining of pain in the lateral part of his foot. Nurse reports a lot more drainage. He has been using Hydrofera Blue. Switch to silver alginate today 08/03/2016 -- I was asked to see the patient was here for a nurse visit today. I understand he had a lot of pain in his right lower extremity and was having blisters on his right foot  which have not been there before. Though he started on doxycycline he does not have blisters elsewhere on his body. I do not believe this is a drug allergy. also mentioned that there was a copious purulent discharge from the wound and clinically there is no evidence of cellulitis. 08/07/16; I note that the patient came in for his nurse check on Friday apparently with blisters on his toes on the right than a lot of swelling in his forefoot. He continued on the doxycycline that I had prescribed on 12/8. A culture was done of the lateral wound that showed a combination of a few Proteus and Pseudomonas. Doxycycline might of covered the Proteus but would be unlikely to cover the Pseudomonas. He is on Coumadin. He arrives in the clinic today feeling a lot better states the pain is a lot better but nothing specific really was done other than to rewrap the foot also noted that he had arterial studies ordered in August although these were never done. It is reasonable to go ahead and reorder these. 08/14/16; generally arrives in a better state today in terms of the wounds he has taken cefdinir for one week. Our intake nurse reports copious amounts of drainage but the patient is complaining of much less pain. He is not had his PT and INR checked and I've asked him to do this today or tomorrow. 08/24/2016 -- patient arrives today after 10 days and said he had a stomach upset. His arterial study was done and I have reviewed this report and find it to be within normal limits. However I did not note any venous duplex studies for reflux, and Dr. Leanord Hawkingobson may have ordered these in the past but I will leave it to him to decide if he needs these. The patient has finished his course of cefdinir. 08/28/16; patient arrives today again with copious amounts of thick really green drainage for our intake nurse. He states he has a very tender spot at the superior part of the lateral wound. Wounds are larger 09/04/16; no real  change in the condition of this patient's wound still copious amounts of surface slough. Started him on Iodoflex last week he is completing another course of Cefdinir or which I think was done empirically. His arterial study showed ABIs were 1.1 on the right 1.5 on the left. He did have a slightly reduced ABI in the right the left one was not obtained. Had calcification of the right posterior tibial artery. The interpretation was no segmental stenosis. His waveforms were triphasic. His reflux studies are later this month. Depending on this I'll send him for a vascular consultation, he may need to see plastic surgery as I believe he is had plastic surgery on this foot in the past. He had an injury to the foot  in the 1980s. 1/16 /18 right lateral greater than right medial ankle wounds on the right in the setting of previous skin grafting. 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 He does not have arterial issues. Each week he comes in with the same adherent surface slough although there was less of this today 09/18/16; right lateral greater than right medial lower extremity wounds in the setting of previous skin grafting and trauma. He has least to vein laser ablation scheduled for February 2 for venous reflux. He does not have significant arterial disease. Problem has been very difficult to handle surface slough/necrotic tissue. Recently using Iodoflex for this with some, albeit slow improvement Objective [name], [name] Constitutional Patient is hypertensive.. Pulse regular and within target range for patient.Marland Kitchen Respirations regular, non-labored and within target range.. Temperature is normal and within the target range for the patient.. Patient's appearance is neat and clean. Appears in no acute distress. Well nourished and well developed.. Vitals Time Taken: 8:04 AM, Height: 76 in, Weight: 238  lbs, BMI: 29, Temperature: 98.1 F, Pulse: 87 bpm, Respiratory Rate: 18 breaths/min, Blood Pressure: 150/74 mmHg. Eyes Conjunctivae clear. No discharge.Marland Kitchen Respiratory Respiratory effort is easy and symmetric bilaterally. Rate is normal at rest and on room air.. Cardiovascular Pedal pulses palpable and strong bilaterally.. Edema is well controlled, surrounding skin graft.. Lymphatic Nonpalpable no popliteal or inguinal area. Psychiatric No evidence of depression, anxiety, or agitation. Calm, cooperative, and communicative. Appropriate interactions and affect.. General Notes: Wound exam; wounds are unchanged in terms of dimensions however the surface looks better and there is reasonably healthy granulation showing through the surface slough. No overt necrotic surface material. I therefore elected not to debridement this today Integumentary (Hair, Skin) There is no evidence of surrounding infection around the wound.. Wound #1 status is Open. Original cause of wound was Gradually Appeared. The wound is located on the Right,Lateral Malleolus. The wound measures 6.5cm length x 8.8cm width x 0.3cm depth; 44.925cm^2 area and 13.477cm^3 volume. There is Fat Layer (Subcutaneous Tissue) Exposed exposed. There is no tunneling or undermining noted. There is a large amount of purulent drainage noted. The wound margin is thickened. There is small (1-33%) pink granulation within the wound bed. There is a large (67-100%) amount of necrotic tissue within the wound bed including Adherent Slough. The periwound skin appearance exhibited: Scarring, Maceration, Hemosiderin Staining. The periwound skin appearance did not exhibit: Callus, Crepitus, Excoriation, Induration, Rash, Dry/Scaly, Atrophie Blanche, Cyanosis, Ecchymosis, Mottled, Pallor, Rubor, Erythema. Periwound temperature was noted as No Abnormality. The periwound has tenderness on palpation. Wound #2 status is Open. Original cause of wound was  Gradually Appeared. The wound is located on the Right,Medial Malleolus. The wound measures 4.2cm length x 3cm width x 0.2cm depth; 9.896cm^2 area and 1.979cm^3 volume. There is Fat Layer (Subcutaneous Tissue) Exposed exposed. There is no tunneling or undermining noted. There is a large amount of purulent drainage noted. The wound margin is thickened. There is small (1-33%) pink granulation within the wound bed. There is a large (67-100%) amount of necrotic tissue within the wound bed including Adherent Slough. The periwound skin appearance exhibited: [name], [name] Scarring, Hemosiderin Staining. Periwound temperature was noted as No Abnormality. The periwound has tenderness on palpation. 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 No tub bath. Wound #2 Right,Medial Malleolus: Cleanse wound with mild soap and water May shower with protection. No tub bath. Skin Barriers/Peri-Wound Care: Wound #1 Right,Lateral Malleolus: Barrier cream - zinc oxide Triamcinolone Acetonide Ointment Wound #2 Right,Medial Malleolus: Barrier cream - zinc oxide Triamcinolone Acetonide Ointment Primary Wound Dressing: Wound #1 Right,Lateral Malleolus: Iodoflex Wound #2 Right,Medial Malleolus: Iodoflex Secondary Dressing: Wound #1 Right,Lateral Malleolus: ABD pad Dry Gauze Drawtex Wound #2 Right,Medial Malleolus: [place], Daquon C. ([id number]) ABD pad Dry Gauze Drawtex Follow-up Appointments: Wound #1  Right,Lateral Malleolus: Return Appointment in 1 week. Other: - nurse visit Friday Wound #2 Right,Medial Malleolus: Return Appointment in 1 week. Other: - nurse visit Friday Edema Control: Wound #1 Right,Lateral Malleolus: 3 Layer Compression System - Right Lower Extremity - UNNA TO ANCHOR Elevate legs to the level of the heart and pump ankles as often as possible Wound #2 Right,Medial Malleolus: 3 Layer Compression System - Right Lower Extremity - UNNA TO ANCHOR Elevate legs to the level of the heart and pump ankles as often as possible Additional Orders / Instructions: Wound #1 Right,Lateral Malleolus: Increase protein intake. OK to return to work with the following restrictions: Activity as tolerated Wound #2 Right,Medial Malleolus: Increase protein intake. OK to return to work with the following restrictions: Activity as tolerated Medications-please add to medication list.: Wound #1 Right,Lateral Malleolus: P.O. Antibiotics - Cefidinir Other: - Vitamin C, Zinc, Multivitamin Wound #2 Right,Medial Malleolus: P.O. Antibiotics - continue Cefidinir Other: - Vitamin C, Zinc, Multivitamin o o 1 the patient did have some maceration around the wounds although I don't think there is any evidence of cellulitis. I think this was drainage, as well we did not get to change the dressing last Friday as per usual. [name], [name] #2 we will continue with Iodoflex which really seems to be helping the surface of the wound, changed her drawtex is a secondary dressing ABD pads under the same compression. #3 hydrofera if I can get the surface healthier. #4 ablation next week Electronic Signature(s) Signed: 09/21/2016 7:57:49 AM By: Elliot Gurney RN, BSN, Kim RN, BSN Signed: 10/10/2016 7:48:39 AM By: [name] MD Previous Signature: 09/18/2016 4:44:13 PM Version By: [name] MD Entered By: Elliot Gurney RN, BSN, Kim on 09/21/2016 07:57:49 [name], [name]  ([id number]) -------------------------------------------------------------------------------- SuperBill Details [name] Date of Service: 09/18/2016 Patient Name: C. Patient Account Number: [id number] Medical Record Treating RN: [name] [id number] Number: Other Clinician: Date of Birth/Sex: 1945-11-21 (71 y.o. 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, initial T85.613A encounter Facility Procedures CPT4: Description Modifier Quantity Code [id number] (Facility Use Only) 32440NU - APPLY MULTLAY COMPRS LWR RT 1 LEG Physician Procedures CPT4: Description Modifier Quantity Code [id number] - WC PHYS LEVEL 3 - EST PT 1 ICD-10 Description Diagnosis I87.331 Chronic venous hypertension (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 L97.212 Non-pressure  chronic ulcer of  right calf with fat layer exposed Electronic Signature(s) Signed: 09/18/2016 3:17:24 PM By: Alejandro Mulling Signed: 09/18/2016 4:44:13 PM By: [name] MD Entered By: Alejandro Mulling on 09/18/2016 13:02:10

## 2016-09-19 NOTE — Progress Notes (Signed)
PINCHAS, REITHER ([id number]) Visit Report for 09/18/2016 Arrival Information Details Patient Name: [name], [name]. Date of Service: 09/18/2016 8:00 AM Medical Record Number: [id number] Patient Account Number: [id number] Date of Birth/Sex: June 15, 1946 (71 y.o. Male) Treating RN: [name] Primary Care Tandi Hanko: [facility] [place] Other Clinician: Referring Taite Baldassari: [facility] [place] Treating Adahlia Stembridge/Extender: Altamese [place] in Treatment: 35 Visit Information History Since Last Visit All ordered tests and consults were No Patient Arrived: Ambulatory completed: Arrival Time: 08:01 Added or deleted any medications: No Accompanied By: self Any new allergies or adverse reactions: No Transfer Assistance: None Had a fall or experienced change in No Patient Identification Verified: Yes activities of daily living that may affect Secondary Verification Process Yes risk of falls: Completed: Signs or symptoms of abuse/neglect No Patient Requires Transmission-Based No since last visito Precautions: Hospitalized since last visit: No Patient Has Alerts: No Has Dressing in Place as Prescribed: Yes Has Compression in Place as Yes Prescribed: Has Footwear/Offloading in Place as Yes Prescribed: Right: Surgical Shoe with Pressure Relief Insole Pain Present Now: No Electronic Signature(s) Signed: 09/18/2016 3:17:24 PM By: Alejandro Mulling Entered By: Alejandro Mulling on 09/18/2016 08:03:45 [name] ([id number]) -------------------------------------------------------------------------------- Encounter Discharge Information Details Patient Name: [name] Date of Service: 09/18/2016 8:00 AM Medical Record Number: [id number] Patient Account Number: [id number] Date of Birth/Sex: July 28, 1946 (71 y.o. Male) Treating RN: [name] Primary Care Taeko Schaffer: [place] [place] Other Clinician: Referring Nakeesha Bowler: [facility] [place] Treating Eula Jaster/Extender: Altamese [place] in Treatment: 35 Encounter Discharge Information Items Discharge Pain Level: 0 Discharge Condition: Stable Ambulatory Status: Ambulatory Discharge Destination: Home Private Transportation: Auto Accompanied By: self Schedule Follow-up Appointment: Yes Medication Reconciliation completed and Yes provided to Patient/Care Savier Trickett: Clinical Summary of Care: Electronic Signature(s) Signed: 09/18/2016 3:17:24 PM By: Alejandro Mulling Previous Signature: 09/18/2016 8:42:58 AM Version By: Gwenlyn Perking Entered By: Alejandro Mulling on 09/18/2016 08:43:09 [name] ([id number]) -------------------------------------------------------------------------------- Lower Extremity Assessment Details Patient Name: [name] Date of Service: 09/18/2016 8:00 AM Medical Record Number: [id number] Patient Account Number: [id number] Date of Birth/Sex: 1946/05/04 (71 y.o. Male) Treating RN: [name] Primary Care Louiza Moor: [facility] [place] Other Clinician: Referring Columbia Pandey: [facility] [place] Treating Cyan Moultrie/Extender: [name] Weeks in Treatment: 35 Vascular Assessment Pulses: Dorsalis Pedis Palpable: [Right:Yes] Posterior Tibial Extremity colors, hair growth, and conditions: Extremity Color: [Right:Normal] Temperature of Extremity: [Right:Warm] Capillary Refill: [Right:< 3 seconds] Toe Nail Assessment Left: Right: Thick: Yes Discolored: Yes Deformed: No Improper Length and Hygiene: No Electronic Signature(s) Signed: 09/18/2016 3:17:24 PM By: Alejandro Mulling Entered By: Alejandro Mulling on 09/18/2016 08:19:23 [name] ([id number]) -------------------------------------------------------------------------------- Multi Wound Chart Details Patient Name: [name] Date of Service: 09/18/2016 8:00 AM Medical Record Number: [id number] Patient Account Number: [id number] Date of Birth/Sex: [birth year] (71 y.o. Male) Treating RN:  [name] Primary Care Keara Pagliarulo: [facility] [place] Other Clinician: Referring Militza Devery: [facility] [place] Treating Kwabena Strutz/Extender: [name] Weeks in Treatment: 35 Vital Signs Height(in): 76 Pulse(bpm): 87 Weight(lbs): 238 Blood Pressure 150/74 (mmHg): Body Mass Index(BMI): 29 Temperature(F): 98.1 Respiratory Rate 18 (breaths/min): Photos: [1:No Photos] [2:No Photos] [N/A:N/A] Wound Location: [1:Right Malleolus - Lateral] [2:Right Malleolus - Medial] [N/A:N/A] Wounding Event: [1:Gradually Appeared] [2:Gradually Appeared] [N/A:N/A] Primary Etiology: [1:Venous Leg Ulcer] [2:Venous Leg Ulcer] [N/A:N/A] Comorbid History: [1:Cataracts, Chronic Obstructive Pulmonary Disease (COPD), Osteoarthritis] [2:Cataracts, Chronic Obstructive Pulmonary Disease (COPD), Osteoarthritis] [N/A:N/A] Date Acquired: [1:08/11/2015] [2:01/30/2016] [N/A:N/A] Weeks of Treatment: [1:35] [2:33] [N/A:N/A] Wound Status: [1:Open] [2:Open] [N/A:N/A] Clustered Wound: [1:No] [2:Yes] [N/A:N/A] Measurements L x W x D  6.5x8.8x0.3 [2:4.2x3x0.2] [N/A:N/A] (cm) Area (cm) : [1:44.925] [2:9.896] [N/A:N/A] Volume (cm) : [1:13.477] [2:1.979] [N/A:N/A] % Reduction in Area: [1:-160.00%] [2:14.30%] [N/A:N/A] % Reduction in Volume: -160.00% [2:-71.30%] [N/A:N/A] Classification: [1:Full Thickness Without Exposed Support Structures] [2:Full Thickness Without Exposed Support Structures] [N/A:N/A] Exudate Amount: [1:Large] [2:Large] [N/A:N/A] Exudate Type: [1:Purulent] [2:Purulent] [N/A:N/A] Exudate Color: [1:yellow, brown, green] [2:yellow, brown, green] [N/A:N/A] Foul Odor After [1:Yes] [2:Yes] [N/A:N/A] Cleansing: Odor Anticipated Due to No [2:No] [N/A:N/A] Product Use: Wound Margin: [1:Thickened] [2:Thickened] [N/A:N/A] Granulation Amount: [1:Small (1-33%)] [2:Small (1-33%)] [N/A:N/A] Granulation Quality: Pink, Hyper-granulation Pink, Hyper-granulation N/A Necrotic Amount: Large (67-100%) Large (67-100%)  N/A Exposed Structures: Fat Layer (Subcutaneous Fat Layer (Subcutaneous N/A Tissue) Exposed: Yes Tissue) Exposed: Yes Fascia: No Fascia: No Tendon: No Tendon: No Muscle: No Muscle: No Joint: No Joint: No Bone: No Bone: No Epithelialization: None None N/A Periwound Skin Texture: Scarring: Yes Scarring: Yes N/A Excoriation: No Induration: No Callus: No Crepitus: No Rash: No Periwound Skin Maceration: Yes No Abnormalities Noted N/A Moisture: Dry/Scaly: No Periwound Skin Color: Hemosiderin Staining: Yes Hemosiderin Staining: Yes N/A Atrophie Blanche: No Cyanosis: No Ecchymosis: No Erythema: No Mottled: No Pallor: No Rubor: No Temperature: No Abnormality No Abnormality N/A Tenderness on Yes Yes N/A Palpation: Wound Preparation: Ulcer Cleansing: Ulcer Cleansing: N/A Rinsed/Irrigated with Rinsed/Irrigated with Saline, Other: soap and Saline, Other: soap and water water Topical Anesthetic Topical Anesthetic Applied: None Applied: None, Other: lidocaine 4% Treatment Notes Electronic Signature(s) Signed: 09/18/2016 4:44:13 PM By: [name] MD Entered By: [name] on 09/18/2016 08:24:37 [name] ([id number]) -------------------------------------------------------------------------------- Pain Assessment Details Patient Name: [name] Date of Service: 09/18/2016 8:00 AM Medical Record Number: [id number] Patient Account Number: [id number] Date of Birth/Sex: 07-12-1946 (71 y.o. Male) Treating RN: [name] Primary Care Carmie Lanpher: [place] [place] Other Clinician: Referring Avraj Lindroth: [facility] [place] Treating Brigette Hopfer/Extender: [name] Weeks in Treatment: 35 Active Problems Location of Pain Severity and Description of Pain Patient Has Paino No Site Locations With Dressing Change: No Pain Management and Medication Current Pain Management: Electronic Signature(s) Signed: 09/18/2016 3:17:24 PM By: Alejandro Mulling Entered  By: Alejandro Mulling on 09/18/2016 08:04:28 [name] ([id number]) -------------------------------------------------------------------------------- Patient/Caregiver Education Details [name] Date of Service: 09/18/2016 8:00 AM Patient Name: C. Patient Account Number: [id number] Medical Record Treating RN: [name] [id number] Number: Other Clinician: Date of Birth/Gender: Aug 17, 1946 (70 y.o. Male) Treating [name] Primary Care Physician: [facility], [place] Physician/Extender: G Referring Physician: [facility] [place] Weeks in Treatment: 81 Education Assessment Education Provided To: Patient Education Topics Provided Wound/Skin Impairment: Handouts: Other: do not get wrap wet Methods: Demonstration, Explain/Verbal Responses: State content correctly Electronic Signature(s) Signed: 09/18/2016 3:17:24 PM By: Alejandro Mulling Entered By: Alejandro Mulling on 09/18/2016 08:43:22 Shon, [name] ([id number]) -------------------------------------------------------------------------------- Wound Assessment Details Patient Name: [name] Date of Service: 09/18/2016 8:00 AM Medical Record Number: [id number] Patient Account Number: [id number] Date of Birth/Sex: 1945-11-09 (71 y.o. Male) Treating RN: [name] Primary Care Juliet Vasbinder: [place] [place] Other Clinician: Referring Kyle Luppino: [facility] [place] Treating Bransyn Adami/Extender: [name] Weeks in Treatment: 35 Wound Status Wound Number: 1 Primary Venous Leg Ulcer Etiology: Wound Location: Right Malleolus - Lateral Wound Open Wounding Event: Gradually Appeared Status: Date Acquired: 08/11/2015 Comorbid Cataracts, Chronic Obstructive Weeks Of Treatment: 35 History: Pulmonary Disease (COPD), Clustered Wound: No Osteoarthritis Photos Photo Uploaded By: Alejandro Mulling on 09/18/2016 12:00:37 Wound Measurements Length: (cm) 6.5 Width: (cm) 8.8 Depth: (cm) 0.3 Area: (cm)  44.925 Volume: (cm) 13.477 % Reduction in Area: -160% % Reduction in Volume: -160%  Epithelialization: None Tunneling: No Undermining: No Wound Description Full Thickness Without Exposed Foul Odor After Classification: Support Structures Due to Product Wound Margin: Thickened Slough/Fibrino Exudate Large Amount: Exudate Type: Purulent Exudate Color: yellow, brown, green Cleansing: Yes Use: No Yes Wound Bed Granulation Amount: Small (1-33%) Exposed Structure Granulation Quality: Pink, Hyper-granulation Fascia Exposed: No Necrotic Amount: Large (67-100%) Fat Layer (Subcutaneous Tissue) Exposed: Yes Necrotic Quality: Adherent Slough Tendon Exposed: No Froning, Darrill C. ([id number]) Muscle Exposed: No Joint Exposed: No Bone Exposed: No Periwound Skin Texture Texture Color No Abnormalities Noted: No No Abnormalities Noted: No Callus: No Atrophie Blanche: No Crepitus: No Cyanosis: No Excoriation: No Ecchymosis: No Induration: No Erythema: No Rash: No Hemosiderin Staining: Yes Scarring: Yes Mottled: No Pallor: No Moisture Rubor: No No Abnormalities Noted: No Dry / Scaly: No Temperature / Pain Maceration: Yes Temperature: No Abnormality Tenderness on Palpation: Yes Wound Preparation Ulcer Cleansing: Rinsed/Irrigated with Saline, Other: soap and water, Topical Anesthetic Applied: None Treatment Notes Wound #1 (Right, Lateral Malleolus) 1. Cleansed with: Clean wound with Normal Saline Cleanse wound with antibacterial soap and water 2. Anesthetic Topical Lidocaine 4% cream to wound bed prior to debridement 4. Dressing Applied: Iodoflex 5. Secondary Dressing Applied ABD Pad Dry Gauze 7. Secured with Tape 3 Layer Compression System - Right Lower Extremity Notes drawtex Electronic Signature(s) Signed: 09/18/2016 3:17:24 PM By: Alejandro MullingPinkerton, Debra Entered By: Alejandro MullingPinkerton, Debra on 09/18/2016 08:14:51 [name] StackBLACKWELL, Cleland C.  ([id number]) -------------------------------------------------------------------------------- Wound Assessment Details Patient Name: [name] StackBLACKWELL, Kerrigan C. Date of Service: 09/18/2016 8:00 AM Medical Record Number: [id number] Patient Account Number: [id number] Date of Birth/Sex: 06-30-1946 [age] y.o. Male) Treating RN: Phillis HaggisPinkerton, [name] Primary Care [name] [name]: [facility] [place] Other Clinician: Referring Briella Hobday: [facility] [place] Treating Liem Copenhaver/Extender: Maxwell CaulOBSON, [name] G Weeks in Treatment: 35 Wound Status Wound Number: 2 Primary Venous Leg Ulcer Etiology: Wound Location: Right Malleolus - Medial Wound Open Wounding Event: Gradually Appeared Status: Date Acquired: 01/30/2016 Comorbid Cataracts, Chronic Obstructive Weeks Of Treatment: 33 History: Pulmonary Disease (COPD), Clustered Wound: Yes Osteoarthritis Photos Photo Uploaded By: Alejandro MullingPinkerton, Debra on 09/18/2016 12:00:37 Wound Measurements Length: (cm) 4.2 Width: (cm) 3 Depth: (cm) 0.2 Area: (cm) 9.896 Volume: (cm) 1.979 % Reduction in Area: 14.3% % Reduction in Volume: -71.3% Epithelialization: None Tunneling: No Undermining: No Wound Description Full Thickness Without Exposed Foul Odor After Classification: Support Structures Due to Product Wound Margin: Thickened Slough/Fibrino Exudate Large Amount: Exudate Type: Purulent Exudate Color: yellow, brown, green Cleansing: Yes Use: No Yes Wound Bed Granulation Amount: Small (1-33%) Exposed Structure Granulation Quality: Pink, Hyper-granulation Fascia Exposed: No Necrotic Amount: Large (67-100%) Fat Layer (Subcutaneous Tissue) Exposed: Yes Necrotic Quality: Adherent Slough Tendon Exposed: No Stribling, Tytan C. ([id number]) Muscle Exposed: No Joint Exposed: No Bone Exposed: No Periwound Skin Texture Texture Color No Abnormalities Noted: No No Abnormalities Noted: No Scarring: Yes Hemosiderin Staining: Yes Moisture Temperature / Pain No Abnormalities Noted:  No Temperature: No Abnormality Tenderness on Palpation: Yes Wound Preparation Ulcer Cleansing: Rinsed/Irrigated with Saline, Other: soap and water, Topical Anesthetic Applied: None, Other: lidocaine 4%, Treatment Notes Wound #2 (Right, Medial Malleolus) 1. Cleansed with: Clean wound with Normal Saline Cleanse wound with antibacterial soap and water 2. Anesthetic Topical Lidocaine 4% cream to wound bed prior to debridement 4. Dressing Applied: Iodoflex 5. Secondary Dressing Applied ABD Pad Dry Gauze 7. Secured with Tape 3 Layer Compression System - Right Lower Extremity Notes drawtex Electronic Signature(s) Signed: 09/18/2016 3:17:24 PM By: Alejandro MullingPinkerton, Debra Entered By: Alejandro MullingPinkerton, Debra on 09/18/2016 08:13:34 [name] StackBLACKWELL, Ameen C. ([id number]) -------------------------------------------------------------------------------- Vitals Details Patient  Name: Lindquist, Cloud C. Date of Service: 09/18/2016 8:00 AM Medical Record Number: [id number] Patient Account Number: [id number] Date of Birth/Sex: 10-08-1945 (71 y.o. Male) Treating RN: [name] Primary Care Jenesys Casseus: [facility] [place] Other Clinician: Referring Lorel Lembo: [facility] [place] Treating Dimas Scheck/Extender: [name] Weeks in Treatment: 35 Vital Signs Time Taken: 08:04 Temperature (F): 98.1 Height (in): 76 Pulse (bpm): 87 Weight (lbs): 238 Respiratory Rate (breaths/min): 18 Body Mass Index (BMI): 29 Blood Pressure (mmHg): 150/74 Reference Range: 80 - 120 mg / dl Electronic Signature(s) Signed: 09/18/2016 3:17:24 PM By: Alejandro Mulling Entered By: Alejandro Mulling on 09/18/2016 08:05:23

## 2016-09-21 DIAGNOSIS — I87331 Chronic venous hypertension (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: Secondary | ICD-10-CM | POA: Diagnosis not present

## 2016-09-22 NOTE — Progress Notes (Signed)
JOANNA, HALL ([id number]) Visit Report for 09/21/2016 Arrival Information Details Patient Name: [name], [name]. Date of Service: 09/21/2016 8:00 AM Medical Record Number: [id number] Patient Account Number: [id number] Date of Birth/Sex: 10-23-1945 (71 y.o. Male) Treating RN: [name] Primary Care Cayman Brogden: [facility], [place] Other Clinician: Referring Daelyn Pettaway: [facility] [place] Treating Zacharie Portner/Extender: Rudene Re in Treatment: 35 Visit Information History Since Last Visit All ordered tests and consults were completed: No Patient Arrived: Ambulatory Added or deleted any medications: No Arrival Time: 08:02 Any new allergies or adverse reactions: No Accompanied By: self Had a fall or experienced change in No Transfer Assistance: None activities of daily living that may affect Patient Identification Verified: Yes risk of falls: Secondary Verification Process Yes Signs or symptoms of abuse/neglect since last No Completed: visito Patient Requires Transmission-Based No Hospitalized since last visit: No Precautions: Has Dressing in Place as Prescribed: Yes Patient Has Alerts: No Has Compression in Place as Prescribed: Yes Pain Present Now: No Electronic Signature(s) Signed: 09/21/2016 4:32:35 PM By: Alejandro Mulling Entered By: Alejandro Mulling on 09/21/2016 08:27:58 [name] ([id number]) -------------------------------------------------------------------------------- Encounter Discharge Information Details Patient Name: [name] Date of Service: 09/21/2016 8:00 AM Medical Record Number: [id number] Patient Account Number: [id number] Date of Birth/Sex: March 14, 1946 (71 y.o. Male) Treating RN: [name] Primary Care Jameis Newsham: [place], [place] Other Clinician: Referring Serrina Minogue: [facility] [place] Treating Arieon Scalzo/Extender: Rudene Re in Treatment: 23 Encounter Discharge Information Items Discharge Pain Level: 0 Discharge  Condition: Stable Ambulatory Status: Ambulatory Discharge Destination: Home Private Transportation: Auto Accompanied By: self Schedule Follow-up Appointment: Yes Medication Reconciliation completed and No provided to Patient/Care Aleka Twitty: Clinical Summary of Care: Electronic Signature(s) Signed: 09/21/2016 4:32:35 PM By: Alejandro Mulling Entered By: Alejandro Mulling on 09/21/2016 08:30:02 [name] ([id number]) -------------------------------------------------------------------------------- Patient/Caregiver Education Details Patient Name: [name] Date of Service: 09/21/2016 8:00 AM Medical Record Number: [id number] Patient Account Number: [id number] Date of Birth/Gender: [birth year] (70 y.o. Male) Treating RN: [name] Primary Care Physician: [place], [place] Other Clinician: Referring Physician: [facility] [place] Treating Physician/Extender: Rudene Re in Treatment: 74 Education Assessment Education Provided To: Patient Education Topics Provided Wound/Skin Impairment: Handouts: Other: do not get wrap wet Methods: Demonstration, Explain/Verbal Responses: State content correctly Electronic Signature(s) Signed: 09/21/2016 4:32:35 PM By: Alejandro Mulling Entered By: Alejandro Mulling on 09/21/2016 08:29:45 [name] ([id number]) -------------------------------------------------------------------------------- Wound Assessment Details Patient Name: [name] Date of Service: 09/21/2016 8:00 AM Medical Record Number: [id number] Patient Account Number: [id number] Date of Birth/Sex: 1945/10/27 (71 y.o. Male) Treating RN: [name] Primary Care Jaylea Plourde: [place], [place] Other Clinician: Referring Lorelai Huyser: [facility] [place] Treating Alanni Vader/Extender: Rudene Re in Treatment: 35 Wound Status Wound Number: 1 Primary Venous Leg Ulcer Etiology: Wound Location: Right Malleolus - Lateral Wound Open Wounding Event:  Gradually Appeared Status: Date Acquired: 08/11/2015 Comorbid Cataracts, Chronic Obstructive Weeks Of Treatment: 35 History: Pulmonary Disease (COPD), Clustered Wound: No Osteoarthritis Wound Measurements Length: (cm) 6.5 Width: (cm) 8.8 Depth: (cm) 0.3 Area: (cm) 44.925 Volume: (cm) 13.477 % Reduction in Area: -160% % Reduction in Volume: -160% Epithelialization: None Tunneling: No Undermining: No Wound Description Full Thickness Without Exposed Foul Odor After Classification: Support Structures Due to Product Wound Margin: Thickened Slough/Fibrino Exudate Large Amount: Exudate Type: Purulent Exudate Color: yellow, brown, green Cleansing: Yes Use: No Yes Wound Bed Granulation Amount: Small (1-33%) Exposed Structure Granulation Quality: Pink, Hyper-granulation Fascia Exposed: No Necrotic Amount: Large (67-100%) Fat Layer (Subcutaneous Tissue) Exposed: Yes Necrotic Quality: Adherent Slough Tendon Exposed: No Muscle Exposed: No Joint  Exposed: No Bone Exposed: No Periwound Skin Texture Texture Color No Abnormalities Noted: No No Abnormalities Noted: No Callus: No Atrophie Blanche: No Crepitus: No Cyanosis: No [name], [name] ([id number]) Excoriation: No Ecchymosis: No Induration: No Erythema: No Rash: No Hemosiderin Staining: Yes Scarring: Yes Mottled: No Pallor: No Moisture Rubor: No No Abnormalities Noted: No Dry / Scaly: No Temperature / Pain Maceration: Yes Temperature: No Abnormality Tenderness on Palpation: Yes Wound Preparation Ulcer Cleansing: Rinsed/Irrigated with Saline, Other: soap and water, Topical Anesthetic Applied: None Treatment Notes Wound #1 (Right, Lateral Malleolus) 1. Cleansed with: Clean wound with Normal Saline Cleanse wound with antibacterial soap and water 2. Anesthetic Topical Lidocaine 4% cream to wound bed prior to debridement 3. Peri-wound Care: Barrier cream 4. Dressing Applied: Iodoflex 5.  Secondary Dressing Applied ABD Pad Dry Gauze 7. Secured with Tape 3 Layer Compression System - Right Lower Extremity Notes drawtex Electronic Signature(s) Signed: 09/21/2016 4:32:35 PM By: Alejandro MullingPinkerton, Debra Entered By: Alejandro MullingPinkerton, Debra on 09/21/2016 57:84:6908:28:22 [name] StackBLACKWELL, [name] ([id number]) -------------------------------------------------------------------------------- Wound Assessment Details Patient Name: [name] StackBLACKWELL, [name] Date of Service: 09/21/2016 8:00 AM Medical Record Number: [id number] Patient Account Number: [id number] Date of Birth/Sex: 07/29/1946 [age] y.o. Male) Treating RN: Phillis HaggisPinkerton, [name] Primary Care Efrat Zuidema: [place] Other Clinician: Referring Brown Dunlap: [facility] [place] Treating Kresha Abelson/Extender: Rudene ReBritto, Errol Weeks in Treatment: 35 Wound Status Wound Number: 2 Primary Venous Leg Ulcer Etiology: Wound Location: Right Malleolus - Medial Wound Open Wounding Event: Gradually Appeared Status: Date Acquired: 01/30/2016 Comorbid Cataracts, Chronic Obstructive Weeks Of Treatment: 33 History: Pulmonary Disease (COPD), Clustered Wound: Yes Osteoarthritis Wound Measurements Length: (cm) 4.2 Width: (cm) 3 Depth: (cm) 0.2 Area: (cm) 9.896 Volume: (cm) 1.979 % Reduction in Area: 14.3% % Reduction in Volume: -71.3% Epithelialization: None Tunneling: No Undermining: No Wound Description Full Thickness Without Exposed Foul Odor After Classification: Support Structures Due to Product Wound Margin: Thickened Slough/Fibrino Exudate Large Amount: Exudate Type: Purulent Exudate Color: yellow, brown, green Cleansing: Yes Use: No Yes Wound Bed Granulation Amount: Small (1-33%) Exposed Structure Granulation Quality: Pink, Hyper-granulation Fascia Exposed: No Necrotic Amount: Large (67-100%) Fat Layer (Subcutaneous Tissue) Exposed: Yes Necrotic Quality: Adherent Slough Tendon Exposed: No Muscle Exposed: No Joint Exposed: No Bone Exposed: No Periwound  Skin Texture Texture Color No Abnormalities Noted: No No Abnormalities Noted: No Scarring: Yes Hemosiderin Staining: Yes Moisture Temperature / Pain [name], [name] ([id number]) No Abnormalities Noted: No Temperature: No Abnormality Tenderness on Palpation: Yes Wound Preparation Ulcer Cleansing: Rinsed/Irrigated with Saline, Other: soap and water, Topical Anesthetic Applied: None Treatment Notes Wound #2 (Right, Medial Malleolus) 1. Cleansed with: Clean wound with Normal Saline Cleanse wound with antibacterial soap and water 2. Anesthetic Topical Lidocaine 4% cream to wound bed prior to debridement 3. Peri-wound Care: Barrier cream 4. Dressing Applied: Iodoflex 5. Secondary Dressing Applied ABD Pad Dry Gauze 7. Secured with Tape 3 Layer Compression System - Right Lower Extremity Notes drawtex Electronic Signature(s) Signed: 09/21/2016 4:32:35 PM By: Alejandro MullingPinkerton, Debra Entered By: Alejandro MullingPinkerton, Debra on 09/21/2016 74:25:9508:28:44

## 2016-09-25 ENCOUNTER — Ambulatory Visit (INDEPENDENT_AMBULATORY_CARE_PROVIDER_SITE_OTHER): Payer: Self-pay | Admitting: Vascular Surgery

## 2016-09-25 ENCOUNTER — Encounter (INDEPENDENT_AMBULATORY_CARE_PROVIDER_SITE_OTHER): Payer: Self-pay

## 2016-09-25 ENCOUNTER — Encounter: Payer: Medicare HMO | Admitting: Internal Medicine

## 2016-09-25 DIAGNOSIS — I87331 Chronic venous hypertension (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: Secondary | ICD-10-CM | POA: Diagnosis not present

## 2016-09-26 NOTE — Progress Notes (Signed)
MARLEE, TRENTMAN ([id number]) Visit Report for 09/25/2016 Arrival Information Details Patient Name: [name], [name]. Date of Service: 09/25/2016 8:00 AM Medical Record Number: [id number] Patient Account Number: [id number] Date of Birth/Sex: 06-23-1946 (71 y.o. Male) Treating RN: [name] Primary Care Kamauri Denardo: [facility] [place] Other Clinician: Referring Maeci Kalbfleisch: [facility] [place] Treating Shanine Kreiger/Extender: Altamese [place] in Treatment: 36 Visit Information History Since Last Visit All ordered tests and consults were completed: No Patient Arrived: Ambulatory Added or deleted any medications: No Arrival Time: 08:10 Any new allergies or adverse reactions: No Accompanied By: self Had a fall or experienced change in No Transfer Assistance: None activities of daily living that may affect Patient Identification Verified: Yes risk of falls: Secondary Verification Process Yes Signs or symptoms of abuse/neglect since last No Completed: visito Patient Requires Transmission-Based No Hospitalized since last visit: No Precautions: Has Dressing in Place as Prescribed: Yes Patient Has Alerts: No Has Compression in Place as Prescribed: Yes Pain Present Now: No Electronic Signature(s) Signed: 09/25/2016 4:30:41 PM By: Alejandro Mulling Entered By: Alejandro Mulling on 09/25/2016 08:10:52 [name] ([id number]) -------------------------------------------------------------------------------- Encounter Discharge Information Details Patient Name: [name] Date of Service: 09/25/2016 8:00 AM Medical Record Number: [id number] Patient Account Number: [id number] Date of Birth/Sex: 1945/11/01 (71 y.o. Male) Treating RN: [name] Primary Care Elmond Poehlman: [facility] [place] Other Clinician: Referring Mechele Kittleson: [facility] [place] Treating Zanna Hawn/Extender: [name] Weeks in Treatment: 36 Encounter Discharge Information Items Discharge Pain Level: 0 Discharge  Condition: Stable Ambulatory Status: Ambulatory Discharge Destination: Home Transportation: Private Auto Accompanied By: self Schedule Follow-up Appointment: Yes Medication Reconciliation completed No and provided to Patient/Care Gavriella Hearst: Patient Clinical Summary of Care: Declined Electronic Signature(s) Signed: 09/25/2016 8:51:04 AM By: Gwenlyn Perking Entered By: Gwenlyn Perking on 09/25/2016 08:51:04 [name] ([id number]) -------------------------------------------------------------------------------- Lower Extremity Assessment Details Patient Name: [name] Date of Service: 09/25/2016 8:00 AM Medical Record Number: [id number] Patient Account Number: [id number] Date of Birth/Sex: [birth year] (71 y.o. Male) Treating RN: [name] Primary Care Maritza Goldsborough: [facility] [place] Other Clinician: Referring Katalyna Socarras: [facility] [place] Treating Chela Sutphen/Extender: [name] Weeks in Treatment: 36 Edema Assessment Assessed: [Left: No] [Right: No] E[Left: dema] [Right: :] Calf Left: Right: Point of Measurement: 40 cm From Medial Instep cm 34.2 cm Ankle Left: Right: Point of Measurement: 12 cm From Medial Instep cm 23 cm Vascular Assessment Pulses: Dorsalis Pedis Palpable: [Right:Yes] Posterior Tibial Extremity colors, hair growth, and conditions: Extremity Color: [Right:Normal] Temperature of Extremity: [Right:Warm] Capillary Refill: [Right:< 3 seconds] Electronic Signature(s) Signed: 09/25/2016 4:30:41 PM By: Alejandro Mulling Entered By: Alejandro Mulling on 09/25/2016 08:27:22 [name] ([id number]) -------------------------------------------------------------------------------- Multi Wound Chart Details Patient Name: [name] Date of Service: 09/25/2016 8:00 AM Medical Record Number: [id number] Patient Account Number: [id number] Date of Birth/Sex: Apr 16, 1946 (71 y.o. Male) Treating RN: [name] Primary Care Dawsyn Zurn: [facility]  [place] Other Clinician: Referring Alzora Ha: [facility] [place] Treating Jaquel Glassburn/Extender: [name] Weeks in Treatment: 36 Vital Signs Height(in): 76 Pulse(bpm): 80 Weight(lbs): 238 Blood Pressure 156/66 (mmHg): Body Mass Index(BMI): 29 Temperature(F): 98.0 Respiratory Rate 18 (breaths/min): Photos: [1:No Photos] [2:No Photos] [N/A:N/A] Wound Location: [1:Right Malleolus - Lateral] [2:Right Malleolus - Medial] [N/A:N/A] Wounding Event: [1:Gradually Appeared] [2:Gradually Appeared] [N/A:N/A] Primary Etiology: [1:Venous Leg Ulcer] [2:Venous Leg Ulcer] [N/A:N/A] Comorbid History: [1:Cataracts, Chronic Obstructive Pulmonary Disease (COPD), Osteoarthritis] [2:Cataracts, Chronic Obstructive Pulmonary Disease (COPD), Osteoarthritis] [N/A:N/A] Date Acquired: [1:08/11/2015] [2:01/30/2016] [N/A:N/A] Weeks of Treatment: [1:36] [2:34] [N/A:N/A] Wound Status: [1:Open] [2:Open] [N/A:N/A] Clustered Wound: [1:No] [2:Yes] [N/A:N/A] Measurements L x W  x D 6x8.5x0.3 [2:4x3.6x0.2] [N/A:N/A] (cm) Area (cm) : [1:40.055] [2:11.31] [N/A:N/A] Volume (cm) : [1:12.017] [2:2.262] [N/A:N/A] % Reduction in Area: [1:-131.80%] [2:2.00%] [N/A:N/A] % Reduction in Volume: -131.80% [2:-95.80%] [N/A:N/A] Classification: [1:Full Thickness Without Exposed Support Structures] [2:Full Thickness Without Exposed Support Structures] [N/A:N/A] Exudate Amount: [1:Large] [2:Large] [N/A:N/A] Exudate Type: [1:Purulent] [2:Purulent] [N/A:N/A] Exudate Color: [1:yellow, brown, green] [2:yellow, brown, green] [N/A:N/A] Foul Odor After [1:Yes] [2:Yes] [N/A:N/A] Cleansing: Odor Anticipated Due to No [2:No] [N/A:N/A] Product Use: Wound Margin: [1:Thickened] [2:Thickened] [N/A:N/A] Granulation Amount: [1:Small (1-33%)] [2:Small (1-33%)] [N/A:N/A] Granulation Quality: Pink, Hyper-granulation Pink, Hyper-granulation N/A Necrotic Amount: Large (67-100%) Large (67-100%) N/A Exposed Structures: Fat Layer (Subcutaneous Fat Layer  (Subcutaneous N/A Tissue) Exposed: Yes Tissue) Exposed: Yes Fascia: No Fascia: No Tendon: No Tendon: No Muscle: No Muscle: No Joint: No Joint: No Bone: No Bone: No Epithelialization: None None N/A Periwound Skin Texture: Scarring: Yes Scarring: Yes N/A Excoriation: No Induration: No Callus: No Crepitus: No Rash: No Periwound Skin Maceration: Yes No Abnormalities Noted N/A Moisture: Dry/Scaly: No Periwound Skin Color: Hemosiderin Staining: Yes Hemosiderin Staining: Yes N/A Atrophie Blanche: No Cyanosis: No Ecchymosis: No Erythema: No Mottled: No Pallor: No Rubor: No Temperature: No Abnormality No Abnormality N/A Tenderness on Yes Yes N/A Palpation: Wound Preparation: Ulcer Cleansing: Ulcer Cleansing: N/A Rinsed/Irrigated with Rinsed/Irrigated with Saline, Other: soap and Saline, Other: soap and water water Topical Anesthetic Topical Anesthetic Applied: Other: lidocaine Applied: Other: lidocaine 4% 4% Treatment Notes Electronic Signature(s) Signed: 09/25/2016 4:30:41 PM By: Alejandro MullingPinkerton, Debra Entered By: Alejandro MullingPinkerton, Debra on 09/25/2016 08:47:38 [name], [name] ([id number]) -------------------------------------------------------------------------------- Multi-Disciplinary Care Plan Details Patient Name: [name], [name] Date of Service: 09/25/2016 8:00 AM Medical Record Number: [id number] Patient Account Number: [id number] Date of Birth/Sex: [birth year](70 y.o. Male) Treating RN: Phillis HaggisPinkerton, [name] Primary Care Melenda Bielak: [place] Other Clinician: Referring Hollin Crewe: [facility] [place] Treating Cecely Rengel/Extender: Maxwell CaulOBSON, [name] G Weeks in Treatment: 36 Active Inactive ` Venous Leg Ulcer Nursing Diagnoses: Knowledge deficit related to disease process and management Goals: Patient will maintain optimal edema control Date Initiated: 04/03/2016 Target Resolution Date: 11/24/2016 Goal Status: Active Interventions: Compression as ordered Treatment  Activities: Therapeutic compression applied : 04/03/2016 Notes: ` Wound/Skin Impairment Nursing Diagnoses: Impaired tissue integrity Goals: Ulcer/skin breakdown will heal within 14 weeks Date Initiated: 01/17/2016 Target Resolution Date: 11/24/2016 Goal Status: Active Interventions: Assess ulceration(s) every visit Notes: Electronic Signature(s) Signed: 09/25/2016 4:30:41 PM By: Aurelio JewPinkerton, Debra Mccuistion, [name] ([id number]) Entered By: Alejandro MullingPinkerton, Debra on 09/25/2016 08:47:31 [name], [name] ([id number]) -------------------------------------------------------------------------------- Pain Assessment Details Patient Name: [name], Carnell C. Date of Service: 09/25/2016 8:00 AM Medical Record Number: [id number] Patient Account Number: [id number] Date of Birth/Sex: [birth year] [age] y.o. Male) Treating RN: Phillis HaggisPinkerton, [name] Primary Care Zhana Jeangilles: [place] Other Clinician: Referring Crista Nuon: [facility] [place] Treating Joy Reiger/Extender: Maxwell CaulOBSON, [name] G Weeks in Treatment: 36 Active Problems Location of Pain Severity and Description of Pain Patient Has Paino No Site Locations With Dressing Change: No Pain Management and Medication Current Pain Management: Electronic Signature(s) Signed: 09/25/2016 4:30:41 PM By: Alejandro MullingPinkerton, Debra Entered By: Alejandro MullingPinkerton, Debra on 09/25/2016 08:11:01 [name], [name] ([id number]) -------------------------------------------------------------------------------- Patient/Caregiver Education Details [name], [name] Date of Service: 09/25/2016 8:00 AM Patient Name: C. Patient Account Number: [id number] Medical Record Treating RN: Phillis Haggisinkerton, [name] [id number] Number: Other Clinician: Date of Birth/Gender: [birth year] [age] y.o. Male) Treating Baltazar NajjarOBSON, [name] Primary [place] Physician: [place] Physician/Extender: G Referring Physician: [facility] [place] Weeks in Treatment: 5636 Education Assessment Education Provided To: Patient Education Topics  Provided Wound/Skin Impairment: Handouts: Other: change dressing as ordered and do  not get dressing wet Methods: Demonstration, Explain/Verbal Responses: State content correctly Electronic Signature(s) Signed: 09/25/2016 4:30:41 PM By: Alejandro Mulling Entered By: Alejandro Mulling on 09/25/2016 08:49:08 [name] ([id number]) -------------------------------------------------------------------------------- Wound Assessment Details Patient Name: [name] Date of Service: 09/25/2016 8:00 AM Medical Record Number: [id number] Patient Account Number: [id number] Date of Birth/Sex: Nov 21, 1945 (71 y.o. Male) Treating RN: [name] Primary Care Shakoya Gilmore: [place], [place] Other Clinician: Referring Yul Diana: [facility] [place] Treating Mahesh Sizemore/Extender: [name] Weeks in Treatment: 36 Wound Status Wound Number: 1 Primary Venous Leg Ulcer Etiology: Wound Location: Right Malleolus - Lateral Wound Open Wounding Event: Gradually Appeared Status: Date Acquired: 08/11/2015 Comorbid Cataracts, Chronic Obstructive Weeks Of Treatment: 36 History: Pulmonary Disease (COPD), Clustered Wound: No Osteoarthritis Photos Photo Uploaded By: Alejandro Mulling on 09/25/2016 09:37:14 Wound Measurements Length: (cm) 6 Width: (cm) 8.5 Depth: (cm) 0.3 Area: (cm) 40.055 Volume: (cm) 12.017 % Reduction in Area: -131.8% % Reduction in Volume: -131.8% Epithelialization: None Tunneling: No Undermining: No Wound Description Full Thickness Without Exposed Foul Odor After Classification: Support Structures Due to Product Wound Margin: Thickened Slough/Fibrino Exudate Large Amount: Exudate Type: Purulent Exudate Color: yellow, brown, green Cleansing: Yes Use: No Yes Wound Bed Granulation Amount: Small (1-33%) Exposed Structure Granulation Quality: Pink, Hyper-granulation Fascia Exposed: No [name], [name] ([id number]) Necrotic Amount: Large (67-100%) Fat Layer  (Subcutaneous Tissue) Exposed: Yes Necrotic Quality: Adherent Slough Tendon Exposed: No Muscle Exposed: No Joint Exposed: No Bone Exposed: No Periwound Skin Texture Texture Color No Abnormalities Noted: No No Abnormalities Noted: No Callus: No Atrophie Blanche: No Crepitus: No Cyanosis: No Excoriation: No Ecchymosis: No Induration: No Erythema: No Rash: No Hemosiderin Staining: Yes Scarring: Yes Mottled: No Pallor: No Moisture Rubor: No No Abnormalities Noted: No Dry / Scaly: No Temperature / Pain Maceration: Yes Temperature: No Abnormality Tenderness on Palpation: Yes Wound Preparation Ulcer Cleansing: Rinsed/Irrigated with Saline, Other: soap and water, Topical Anesthetic Applied: Other: lidocaine 4%, Treatment Notes Wound #1 (Right, Lateral Malleolus) 1. Cleansed with: Clean wound with Normal Saline Cleanse wound with antibacterial soap and water 2. Anesthetic Topical Lidocaine 4% cream to wound bed prior to debridement 3. Peri-wound Care: Barrier cream 4. Dressing Applied: Iodoflex 5. Secondary Dressing Applied ABD Pad Dry Gauze 7. Secured with 3 Layer Compression System - Right Lower Extremity Notes drawtex Electronic Signature(s) Signed: 09/25/2016 4:30:41 PM By: Aurelio Jew, [name] ([id number]) Entered By: Alejandro Mulling on 09/25/2016 08:24:36 [name] ([id number]) -------------------------------------------------------------------------------- Wound Assessment Details Patient Name: [name] Date of Service: 09/25/2016 8:00 AM Medical Record Number: [id number] Patient Account Number: [id number] Date of Birth/Sex: [birth year] (71 y.o. Male) Treating RN: [name] Primary Care Ambriana Selway: [facility] [place] Other Clinician: Referring Alphonza Tramell: [facility] [place] Treating Austyn Seier/Extender: [name] Weeks in Treatment: 36 Wound Status Wound Number: 2 Primary Venous Leg Ulcer Etiology: Wound Location:  Right Malleolus - Medial Wound Open Wounding Event: Gradually Appeared Status: Date Acquired: 01/30/2016 Comorbid Cataracts, Chronic Obstructive Weeks Of Treatment: 34 History: Pulmonary Disease (COPD), Clustered Wound: Yes Osteoarthritis Photos Photo Uploaded By: Alejandro Mulling on 09/25/2016 09:37:14 Wound Measurements Length: (cm) 4 Width: (cm) 3.6 Depth: (cm) 0.2 Area: (cm) 11.31 Volume: (cm) 2.262 % Reduction in Area: 2% % Reduction in Volume: -95.8% Epithelialization: None Tunneling: No Undermining: No Wound Description Full Thickness Without Exposed Foul Odor After Classification: Support Structures Due to Product Wound Margin: Thickened Slough/Fibrino Exudate Large Amount: Exudate Type: Purulent Exudate Color: yellow, brown, green Cleansing: Yes Use: No Yes Wound Bed Granulation Amount: Small (1-33%) Exposed  Structure Granulation Quality: Pink, Hyper-granulation Fascia Exposed: No [name], [name] Necrotic Amount: Large (67-100%) Fat Layer (Subcutaneous Tissue) Exposed: Yes Necrotic Quality: Adherent Slough Tendon Exposed: No Muscle Exposed: No Joint Exposed: No Bone Exposed: No Periwound Skin Texture Texture Color No Abnormalities Noted: No No Abnormalities Noted: No Scarring: Yes Hemosiderin Staining: Yes Moisture Temperature / Pain No Abnormalities Noted: No Temperature: No Abnormality Tenderness on Palpation: Yes Wound Preparation Ulcer Cleansing: Rinsed/Irrigated with Saline, Other: soap and water, Topical Anesthetic Applied: Other: lidocaine 4%, Treatment Notes Wound #2 (Right, Medial Malleolus) 1. Cleansed with: Clean wound with Normal Saline Cleanse wound with antibacterial soap and water 2. Anesthetic Topical Lidocaine 4% cream to wound bed prior to debridement 3. Peri-wound Care: Barrier cream 4. Dressing Applied: Iodoflex 5. Secondary Dressing Applied ABD Pad Dry Gauze 7. Secured with 3 Layer  Compression System - Right Lower Extremity Notes drawtex Electronic Signature(s) Signed: 09/25/2016 4:30:41 PM By: Alejandro Mulling Entered By: Alejandro Mulling on 09/25/2016 08:24:47 [name] ([id number]) -------------------------------------------------------------------------------- Vitals Details Patient Name: [name] Date of Service: 09/25/2016 8:00 AM Medical Record Number: [id number] Patient Account Number: [id number] Date of Birth/Sex: 1945/10/28 (71 y.o. Male) Treating RN: [name] Primary Care Jarae Panas: [facility] [place] Other Clinician: Referring Dayannara Pascal: [facility] [place] Treating Boniface Goffe/Extender: [name] Weeks in Treatment: 36 Vital Signs Time Taken: 08:11 Temperature (F): 98.0 Height (in): 76 Pulse (bpm): 80 Weight (lbs): 238 Respiratory Rate (breaths/min): 18 Body Mass Index (BMI): 29 Blood Pressure (mmHg): 156/66 Reference Range: 80 - 120 mg / dl Electronic Signature(s) Signed: 09/25/2016 4:30:41 PM By: Alejandro Mulling Entered By: Alejandro Mulling on 09/25/2016 08:12:59

## 2016-09-26 NOTE — Progress Notes (Addendum)
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 at this point in time today patient currently shows no interval signs or symptoms of local or systemic infection. Again his pain level he rates to be a 1 out of 10 at most and overall he tells me that generally this is not giving him much trouble. In fact he even feels maybe a little bit better than last week. We have continue with the 4-layer compression wrap in which she tolerates very well at this point. He is continuing to utilize the [place]. 06/12/16 I think there has been some progression in the status of both of these wounds over today again covered in a gelatinous surface. Has been using Hydrofera Blue. We had used Iodoflex in the past I'm not sure if there was an issue other than changing to something that might progress towards closure faster 06/19/16; he did not tolerate the Flexeril last week secondary to pain and this was changed on Friday back to [facility] area he continues to have copious amounts of gelatinous surface slough which is think inhibiting the speed of healing this area 06/26/16 patient over the last week has utilized the Santyl to try to loosen up some of the tightly  adherent slough that was noted on evaluation last week. The good news is he tells me that the medial malleoli region really does not bother him the right llateral malleoli region is more tender to palpation at this point in time especially in the central/inferior location. However it does appear that the Santyl has done his job to loosen up the adherent slough at this point in time. Fortunately he has no interval signs or symptoms of infection locally or systemically no purulent discharge noted. 07/03/16 at this point in time today patient's wounds appear to be significantly improved over the right medial and lateral malleolus locations. He has much less tenderness at this point in time and the wounds appear clean her although there is still adherent slough this is sufficiently improved over what I saw last week. I still see no evidence of local infection. 07/10/16; continued gradual improvement in the right medial and lateral malleolus locations. The lateral is more substantial wound now divided into 2 by a rim of normal epithelialization. Both areas have adherent surface slough and nonviable subcutaneous tissue 07-17-16-  He continues to have progress to his right medial and lateral malleolus ulcers. He denies any complaints of pain or intolerance to compression. Both ulcers are smaller in size oriented today's measurements, both are covered with a softly adherent slough. 07/24/16; medial wound is smaller, lateral about the same although surface looks better. Still using [name], [name] ([id number]) Hydrofera Blue 07/31/16; arrives today complaining of pain in the lateral part of his foot. Nurse reports a lot more drainage. He has been using Hydrofera Blue. Switch to silver alginate today 08/03/2016 -- I was asked to see the patient was here for a nurse visit today. I understand he had a lot of pain in his right lower extremity and was having blisters on his right foot which have not been  there before. Though he started on doxycycline he does not have blisters elsewhere on his body. I do not believe this is a drug allergy. also mentioned that there was a copious purulent discharge from the wound and clinically there is no evidence of cellulitis. 08/07/16; I note that the patient came in for his nurse check on Friday apparently with blisters on his toes on the right than a lot of swelling in his forefoot. He continued on the doxycycline that I had prescribed on 12/8. A culture was done of the lateral wound that showed a combination of a few Proteus and Pseudomonas. Doxycycline might of covered the Proteus but would be unlikely to cover the Pseudomonas. He is on Coumadin. He arrives in the clinic today feeling a lot better states the pain is a lot better but nothing specific really was done other than to rewrap the foot also noted that he had arterial studies ordered in August although these were never done. It is reasonable to go ahead and reorder these. 08/14/16; generally arrives in a better state today in terms of the wounds he has taken cefdinir for one week. Our intake nurse reports copious amounts of drainage but the patient is complaining of much less pain. He is not had his PT and INR checked and I've asked him to do this today or tomorrow. 08/24/2016 -- patient arrives today after 10 days and said he had a stomach upset. His arterial study was done and I have reviewed this report and find it to be within normal limits. However I did not note any venous duplex studies for reflux, and Dr. Leanord Hawking may have ordered these in the past but I will leave it to him to decide if he needs these. The patient has finished his course of cefdinir. 08/28/16; patient arrives today again with copious amounts of thick really green drainage for our intake nurse. He states he has a very tender spot at the superior part of the lateral wound. Wounds are larger 09/04/16; no real change in the  condition of this patient's wound still copious amounts of surface slough. Started him on Iodoflex last week he is completing another course of Cefdinir or which I think was done empirically. His arterial study showed ABIs were 1.1 on the right 1.5 on the left. He did have a slightly reduced ABI in the right the left one was not obtained. Had calcification of the right posterior tibial artery. The interpretation was no segmental stenosis. His waveforms were triphasic. His reflux studies are later this month. Depending on this I'll send him for a vascular consultation, he may need to see plastic surgery as I believe he is had plastic surgery on this foot in the  past. He had an injury to the foot in the 1980s. 1/16 /18 right lateral greater than right medial ankle wounds on the right in the setting of previous skin grafting. 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 He does not have arterial issues. Each week he comes in with the same adherent surface slough although there was less of this today 09/18/16; right lateral greater than right medial lower extremity wounds in the setting of previous skin grafting and trauma. He has least to vein laser ablation scheduled for February 2 for venous reflux. He does not have significant arterial disease. Problem has been very difficult to handle surface slough/necrotic tissue. Recently using Iodoflex for this with some, albeit slow improvement 09/25/16; right lateral greater than right medial lower extremity venous wounds in the setting of previous skin grafting. He is going for ablation surgery on February 2 after this he'll come back here for rewrap. He has been using Iodoflex as the primary dressing Objective [name], [name]. ([id number]) Constitutional Patient is hypertensive.. Pulse regular and within target range for patient.Marland Kitchen Respirations regular, non-labored and  within target range.. Temperature is normal and within the target range for the patient.. Patient is alert and appears in no distress. Vitals Time Taken: 8:11 AM, Height: 76 in, Weight: 238 lbs, BMI: 29, Temperature: 98.0 F, Pulse: 80 bpm, Respiratory Rate: 18 breaths/min, Blood Pressure: 156/66 mmHg. Eyes Conjunctivae clear. No discharge.. Cardiovascular Right femoral pulses palpable.. Pedal pulses palpable and strong bilaterally.. Lymphatic Nonpalpable in the right popliteal or inguinal area. Psychiatric No evidence of depression, anxiety, or agitation. Calm, cooperative, and communicative. Appropriate interactions and affect.. General Notes: Wound exam; wounds are smaller and although we still has significant slough this is also better. I did not debridement today. As long as these wounds continued to contract we will use Iodoflex. There is no evidence of surrounding infection Integumentary (Hair, Skin) Wound #1 status is Open. Original cause of wound was Gradually Appeared. The wound is located on the Right,Lateral Malleolus. The wound measures 6cm length x 8.5cm width x 0.3cm depth; 40.055cm^2 area and 12.017cm^3 volume. There is Fat Layer (Subcutaneous Tissue) Exposed exposed. There is no tunneling or undermining noted. There is a large amount of purulent drainage noted. The wound margin is thickened. There is small (1-33%) pink granulation within the wound bed. There is a large (67-100%) amount of necrotic tissue within the wound bed including Adherent Slough. The periwound skin appearance exhibited: Scarring, Maceration, Hemosiderin Staining. The periwound skin appearance did not exhibit: Callus, Crepitus, Excoriation, Induration, Rash, Dry/Scaly, Atrophie Blanche, Cyanosis, Ecchymosis, Mottled, Pallor, Rubor, Erythema. Periwound temperature was noted as No Abnormality. The periwound has tenderness on palpation. Wound #2 status is Open. Original cause of wound was Gradually  Appeared. The wound is located on the Right,Medial Malleolus. The wound measures 4cm length x 3.6cm width x 0.2cm depth; 11.31cm^2 area and 2.262cm^3 volume. There is Fat Layer (Subcutaneous Tissue) Exposed exposed. There is no tunneling or undermining noted. There is a large amount of purulent drainage noted. The wound margin is thickened. There is small (1-33%) pink granulation within the wound bed. There is a large (67-100%) amount of necrotic tissue within the wound bed including Adherent Slough. The periwound skin appearance exhibited: Scarring, Hemosiderin Staining. Periwound temperature was noted as No Abnormality. The periwound has tenderness on palpation. 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 No tub bath. Wound #2 Right,Medial Malleolus: Cleanse wound with mild soap and water May shower with protection. No tub bath. Skin Barriers/Peri-Wound Care: Wound #1 Right,Lateral Malleolus: Barrier cream - zinc oxide Triamcinolone Acetonide Ointment Wound #2 Right,Medial Malleolus: Barrier cream - zinc oxide Triamcinolone Acetonide Ointment Primary Wound Dressing: Wound #1 Right,Lateral Malleolus: Iodoflex Wound #2 Right,Medial Malleolus: Iodoflex Secondary Dressing: Wound #1 Right,Lateral Malleolus: ABD pad Dry Gauze Drawtex Wound #2 Right,Medial Malleolus: ABD pad Dry Gauze Drawtex Follow-up Appointments: [name], [name] ([id number]) Wound #1 Right,Lateral  Malleolus: Return Appointment in 1 week. Other: - nurse visit Friday Wound #2 Right,Medial Malleolus: Return Appointment in 1 week. Other: - nurse visit Friday Edema Control: Wound #1 Right,Lateral Malleolus: 3 Layer Compression System - Right Lower Extremity - UNNA TO ANCHOR Elevate legs to the level of the heart and pump ankles as often as possible Wound #2 Right,Medial Malleolus: 3 Layer Compression System - Right Lower Extremity - UNNA TO ANCHOR Elevate legs to the level of the heart and pump ankles as often as possible Additional Orders / Instructions: Wound #1 Right,Lateral Malleolus: Increase protein intake. OK to return to work with the following restrictions: Activity as tolerated Wound #2 Right,Medial Malleolus: Increase protein intake. OK to return to work with the following restrictions: Activity as tolerated Medications-please add to medication list.: Wound #1 Right,Lateral Malleolus: P.O. Antibiotics - Cefidinir Other: - Vitamin C, Zinc, Multivitamin Wound #2 Right,Medial Malleolus: P.O. Antibiotics - continue Cefidinir Other: - Vitamin C, Zinc, Multivitamin At this point I see no reason to change the primary dressing which is iodoflex. wound is imporved secondary dressings and compression unchanged ablation on friday may need debridementy going forward if wound stalls Electronic Signature(s) Signed: 09/27/2016 12:49:03 PM By: Elliot Gurney RN, BSN, Kim RN, BSN Signed: 10/10/2016 7:48:39 AM By: [name] Najjar MD Previous Signature: 09/25/2016 5:54:02 PM Version By: [name] Najjar MD [name] ([id number]) Entered By: Elliot Gurney RN, BSN, Kim on 09/27/2016 12:49:03 [name], [name] ([id number]) -------------------------------------------------------------------------------- SuperBill Details [name] Date of Service: 09/25/2016 Patient Name: C. Patient Account Number: [id number] Medical Record Treating RN: [name] [id number] Number: [id number]  Clinician: Date of Birth/Sex: [birth year] (71 y.o. 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 Physician Procedures CPT4: Description Modifier Quantity Code [id number] - WC PHYS LEVEL 3 - EST PT 1 ICD-10 Description Diagnosis I87.331 Chronic venous hypertension (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 L97.212 Non-pressure chronic ulcer of  right calf with fat layer exposed Electronic Signature(s) Signed: 09/25/2016 4:30:41 PM By: Alejandro Mulling Signed: 09/25/2016 5:54:02 PM By: [name] Najjar MD Entered By:  Alejandro Mulling on 09/25/2016 11:17:50

## 2016-09-28 ENCOUNTER — Ambulatory Visit: Payer: Medicare HMO

## 2016-09-28 ENCOUNTER — Encounter (INDEPENDENT_AMBULATORY_CARE_PROVIDER_SITE_OTHER): Payer: Self-pay | Admitting: Vascular Surgery

## 2016-09-28 ENCOUNTER — Ambulatory Visit (INDEPENDENT_AMBULATORY_CARE_PROVIDER_SITE_OTHER): Payer: Medicare HMO | Admitting: Vascular Surgery

## 2016-09-28 VITALS — BP 146/76 | HR 91 | Resp 16 | Ht 73.0 in | Wt 229.0 lb

## 2016-09-28 DIAGNOSIS — I83018 Varicose veins of right lower extremity with ulcer other part of lower leg: Secondary | ICD-10-CM

## 2016-09-28 DIAGNOSIS — I83019 Varicose veins of right lower extremity with ulcer of unspecified site: Secondary | ICD-10-CM

## 2016-09-28 DIAGNOSIS — L97919 Non-pressure chronic ulcer of unspecified part of right lower leg with unspecified severity: Principal | ICD-10-CM

## 2016-09-28 NOTE — Assessment & Plan Note (Signed)
See laser note 

## 2016-09-28 NOTE — Progress Notes (Signed)
Varicose veins of lower extremities with ulcer (HCC) See laser note   The patient's right lower extremity was sterilely prepped and draped. The ultrasound machine was used to visualize the saphenous vein throughout its course. 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 This was the vein that we treated today. A segment at the knee was selected for access. The superficial saphenous vein was accessed without difficulty using ultrasound guidance with a micro puncture needle. A micro puncture wire and sheath were then placed. A 0.018 wire was placed beyond the saphenofemoral junction through the sheath and the micro puncture sheath was removed. The 65 cm sheath was then placed over the wire and the wire and dilator were removed. The laser fiber was placed through the sheath and its tip was placed approximately 4 cm below the saphenofemoral junction. Tumescent anesthesia was then created with a dilute lidocaine solution. Laser energy was then delivered with constant withdrawal of the sheath and laser fiber. Approximately 1091 Joules of energy were delivered over a length of 30 cm using the 1470 Hz VenaCure machine at [employer]. Sterile dressings were placed. The patient tolerated the procedure well without complications. See below for SSV ablation note.   Varicose veins of lower extremities with ulcer (HCC) See laser note   The patient's right lower extremity was sterilely prepped and draped. The ultrasound machine was used to visualize the lesser saphenous vein throughout its course. A segment in the lower calf was selected for access. The lesser saphenous vein was accessed without difficulty using ultrasound guidance with a micro puncture needle. A 0.018 wire was placed beyond the small saphenous-popliteal junction. The 65cm sheath was placed over the wire and the wire and dilator were removed. The laser fiber was placed through the sheath  and its tip was placed approximately 3-4 cm below the small saphenous-popliteal junction. Tumescent anesthesia was then created with a dilute lidocaine solution. Laser energy was then delivered with constant withdrawal of the sheath and laser fiber. Approximately 951 Joules of energy were delivered over a length of 18 cm using the 1470 Hz Venocare machine at [employer]. Sterile dressings were placed. The patient tolerated the procedure well without complications.

## 2016-10-02 ENCOUNTER — Encounter: Payer: Medicare HMO | Attending: Internal Medicine | Admitting: Internal Medicine

## 2016-10-02 DIAGNOSIS — I87331 Chronic venous hypertension (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: Secondary | ICD-10-CM | POA: Diagnosis present

## 2016-10-02 DIAGNOSIS — Y832 Surgical operation with anastomosis, bypass or graft as the cause of abnormal reaction of the patient, or of later complication, without mention of misadventure at the time of the procedure: Secondary | ICD-10-CM | POA: Insufficient documentation

## 2016-10-02 DIAGNOSIS — L97212 Non-pressure chronic ulcer of right calf with fat layer exposed: Secondary | ICD-10-CM | POA: Diagnosis not present

## 2016-10-02 DIAGNOSIS — T85613A 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, initial encounter: Secondary | ICD-10-CM | POA: Diagnosis not present

## 2016-10-03 ENCOUNTER — Ambulatory Visit (INDEPENDENT_AMBULATORY_CARE_PROVIDER_SITE_OTHER): Payer: Medicare HMO

## 2016-10-03 DIAGNOSIS — L97919 Non-pressure chronic ulcer of unspecified part of right lower leg with unspecified severity: Principal | ICD-10-CM

## 2016-10-03 DIAGNOSIS — I83019 Varicose veins of right lower extremity with ulcer of unspecified site: Secondary | ICD-10-CM | POA: Diagnosis not present

## 2016-10-04 NOTE — Progress Notes (Signed)
AYDIAN, DIMMICK ([id number]) Visit Report for 10/02/2016 Arrival Information Details Patient Name: [name], [name]. Date of Service: 10/02/2016 8:00 AM Medical Record Number: [id number] Patient Account Number: [id number] Date of Birth/Sex: [birth year] (71 y.o. Male) Treating RN: Huel Coventry Primary Care Emonni Depasquale: [place] Other Clinician: Referring Chrissie Dacquisto: [place] Treating Rokia Bosket/Extender: [place] in Treatment: 37 Visit Information History Since Last Visit Added or deleted any medications: No Patient Arrived: Ambulatory Any new allergies or adverse reactions: No Arrival Time: 08:08 Had a fall or experienced change in No Accompanied By: self activities of daily living that may affect Transfer Assistance: None risk of falls: Patient Identification Verified: Yes Signs or symptoms of abuse/neglect since last No Secondary Verification Process Yes visito Completed: Hospitalized since last visit: No Patient Requires Transmission-Based No Has Dressing in Place as Prescribed: Yes Precautions: Has Compression in Place as Prescribed: Yes Patient Has Alerts: No Pain Present Now: No Electronic Signature(s) Signed: 10/02/2016 5:09:04 PM By: Elliot Gurney, RN, BSN, Kim RN, BSN Entered By: Elliot Gurney, RN, BSN, Kim on 10/02/2016 08:09:03 [name] ([id number]) -------------------------------------------------------------------------------- Encounter Discharge Information Details Patient Name: [name] Date of Service: 10/02/2016 8:00 AM Medical Record Number: [id number] Patient Account Number: [id number] Date of Birth/Sex: [birth year] (71 y.o. Male) Treating RN: [name] Primary Care Dreon Pineda: [place] Other Clinician: Referring Siarah Deleo: [place] Treating Raenah Murley/Extender: [name] Weeks in Treatment: 37 Encounter Discharge Information Items Discharge Pain Level: 0 Discharge Condition: Stable Ambulatory Status:  Ambulatory Discharge Destination: Home Transportation: Private Auto Accompanied By: self Schedule Follow-up Appointment: Yes Medication Reconciliation completed Yes and provided to Patient/Care Lawton Dollinger: Patient Clinical Summary of Care: Declined Electronic Signature(s) Signed: 10/02/2016 5:09:04 PM By: Elliot Gurney RN, BSN, Kim RN, BSN Previous Signature: 10/02/2016 8:42:58 AM Version By: Gwenlyn Perking Entered By: Elliot Gurney RN, BSN, Kim on 10/02/2016 08:49:36 [name] ([id number]) -------------------------------------------------------------------------------- Lower Extremity Assessment Details Patient Name: [name] Date of Service: 10/02/2016 8:00 AM Medical Record Number: [id number] Patient Account Number: [id number] Date of Birth/Sex: 1946/07/27 (71 y.o. Male) Treating RN: Huel Coventry Primary Care Exzavier Ruderman: [place] Other Clinician: Referring Brevon Dewald: [place] Treating [place] Inthavong/Extender: [name] Weeks in Treatment: 37 Edema Assessment Assessed: [Left: No] [Right: No] Edema: [Left: N] [Right: o] Vascular Assessment Claudication: Claudication Assessment [Right:None] Pulses: Dorsalis Pedis Palpable: [Right:Yes] Posterior Tibial Extremity colors, hair growth, and conditions: Extremity Color: [Right:Hyperpigmented] Hair Growth on Extremity: [Right:No] Temperature of Extremity: [Right:Warm] Capillary Refill: [Right:< 3 seconds] Toe Nail Assessment Left: Right: Thick: Yes Discolored: Yes Deformed: Yes Improper Length and Hygiene: Yes Electronic Signature(s) Signed: 10/02/2016 5:09:04 PM By: Elliot Gurney, RN, BSN, Kim RN, BSN Entered By: Elliot Gurney, RN, BSN, Kim on 10/02/2016 08:20:12 [name] ([id number]) -------------------------------------------------------------------------------- Multi Wound Chart Details Patient Name: [name] Date of Service: 10/02/2016 8:00 AM Medical Record Number: [id number] Patient Account Number:  [id number] Date of Birth/Sex: [birth year] (71 y.o. Male) Treating RN: Huel Coventry Primary Care Hazley Dezeeuw: [place] Other Clinician: Referring Lurdes Haltiwanger: [place] Treating Taneisha Fuson/Extender: [name] Weeks in Treatment: 37 Vital Signs Height(in): 76 Pulse(bpm): 79 Weight(lbs): 238 Blood Pressure 118/61 (mmHg): Body Mass Index(BMI): 29 Temperature(F): 98.4 Respiratory Rate 16 (breaths/min): Photos: [N/A:N/A] Wound Location: Right Malleolus - Lateral Right, Medial Malleolus N/A Wounding Event: Gradually Appeared Gradually Appeared N/A Primary Etiology: Venous Leg Ulcer Venous Leg Ulcer N/A Comorbid History: Cataracts, Chronic N/A N/A Obstructive Pulmonary Disease (COPD), Osteoarthritis Date Acquired: 08/11/2015 01/30/2016 N/A Weeks of Treatment: 37 35 N/A Wound Status: Open Open N/A Clustered Wound: No  Yes N/A Measurements L x W x D 6x5.6x0.3 4x3x0.2 N/A (cm) Area (cm) : 26.389 9.425 N/A Volume (cm) : 7.917 1.885 N/A % Reduction in Area: -52.70% 18.40% N/A % Reduction in Volume: -52.70% -63.20% N/A Classification: Full Thickness Without Full Thickness Without N/A Exposed Support Exposed Support Structures Structures Exudate Amount: Large N/A N/A Exudate Type: Purulent N/A N/A Exudate Color: yellow, brown, green N/A N/A Yes N/A N/A [name], [name] ([id number]) Foul Odor After Cleansing: Odor Anticipated Due to No N/A N/A Product Use: Wound Margin: Thickened N/A N/A Granulation Amount: Small (1-33%) N/A N/A Granulation Quality: Pink, Hyper-granulation N/A N/A Necrotic Amount: Large (67-100%) N/A N/A Exposed Structures: Fat Layer (Subcutaneous N/A N/A Tissue) Exposed: Yes Fascia: No Tendon: No Muscle: No Joint: No Bone: No Epithelialization: None N/A N/A Debridement: Debridement ([id number](11042- N/A N/A 11047) Pre-procedure 08:25 N/A N/A Verification/Time Out Taken: Pain Control: Other N/A N/A Tissue Debrided: Fibrin/Slough, Fat, N/A  N/A Subcutaneous Level: Skin/Subcutaneous N/A N/A Tissue Debridement Area (sq 33.6 N/A N/A cm): Instrument: Curette N/A N/A Bleeding: Large N/A N/A Hemostasis Achieved: Pressure N/A N/A Procedural Pain: 3 N/A N/A Post Procedural Pain: 1 N/A N/A Debridement Treatment Procedure was tolerated N/A N/A Response: well Post Debridement 6x5.6x0.3 N/A N/A Measurements L x W x D (cm) Post Debridement 7.917 N/A N/A Volume: (cm) Periwound Skin Texture: Scarring: Yes No Abnormalities Noted N/A Excoriation: No Induration: No Callus: No Crepitus: No Rash: No Periwound Skin Maceration: Yes No Abnormalities Noted N/A Moisture: Dry/Scaly: No Periwound Skin Color: Hemosiderin Staining: Yes No Abnormalities Noted N/A Atrophie Blanche: No [name], [name] ([id number]) Cyanosis: No Ecchymosis: No Erythema: No Mottled: No Pallor: No Rubor: No Temperature: No Abnormality N/A N/A Tenderness on Yes No N/A Palpation: Wound Preparation: Ulcer Cleansing: N/A N/A Rinsed/Irrigated with Saline, Other: soap and water Topical Anesthetic Applied: Other: lidocaine 4% Procedures Performed: Debridement N/A N/A Treatment Notes Electronic Signature(s) Signed: 10/03/2016 4:28:51 PM By: Baltazar Najjarobson, [name] MD Entered By: Baltazar Najjarobson, [name] on 10/02/2016 08:37:02 [name], [name] ([id number]) -------------------------------------------------------------------------------- Multi-Disciplinary Care Plan Details Patient Name: [name], [name] Date of Service: 10/02/2016 8:00 AM Medical Record Number: [id number] Patient Account Number: [id number] Date of Birth/Sex: 30-Jan-1946 34(70 y.o. Male) Treating RN: Huel CoventryWoody, Kim Primary Care Jaley Yan[place] Other Clinician: Referring Wessley Emert: [place] Treating Keidrick Murty/Extender: Maxwell CaulOBSON, [name] G Weeks in Treatment: 37 Active Inactive ` Venous Leg Ulcer Nursing Diagnoses: Knowledge deficit related to disease process and management Goals: Patient  will maintain optimal edema control Date Initiated: 04/03/2016 Target Resolution Date: 11/24/2016 Goal Status: Active Interventions: Compression as ordered Treatment Activities: Therapeutic compression applied : 04/03/2016 Notes: ` Wound/Skin Impairment Nursing Diagnoses: Impaired tissue integrity Goals: Ulcer/skin breakdown will heal within 14 weeks Date Initiated: 01/17/2016 Target Resolution Date: 11/24/2016 Goal Status: Active Interventions: Assess ulceration(s) every visit Notes: Electronic Signature(s) Signed: 10/02/2016 5:09:04 PM By: Elliot GurneyWoody, RN, BSN, Kim RN, BSN [address][name], [name] ([id number]) Entered By: Elliot GurneyWoody, RN, BSN, Kim on 10/02/2016 08:20:18 [name], [name] ([id number]) -------------------------------------------------------------------------------- Pain Assessment Details Patient Name: [name], Destry C. Date of Service: 10/02/2016 8:00 AM Medical Record Number: [id number] Patient Account Number: [id number] Date of Birth/Sex: 30-Jan-1946 [age] y.o. Male) Treating RN: Huel CoventryWoody, Kim Primary Care Luvern Mcisaac: [place] Other Clinician: Referring Josua Ferrebee: [place] Treating Mattia Liford/Extender: Maxwell CaulOBSON, [name] G Weeks in Treatment: 37 Active Problems Location of Pain Severity and Description of Pain Patient Has Paino No Site Locations With Dressing Change: No Pain Management and Medication Current Pain Management: Electronic Signature(s) Signed: 10/02/2016 5:09:04 PM By: Elliot GurneyWoody, RN, BSN, Kim RN, BSN  Entered By: Elliot Gurney, RN, BSN, Kim on 10/02/2016 08:12:07 [name] ([id number]) -------------------------------------------------------------------------------- Patient/Caregiver Education Details [name] Date of Service: 10/02/2016 8:00 AM Patient Name: C. Patient Account Number: [id number] Medical Record Treating RN: Huel Coventry [id number] Number: Other Clinician: Date of Birth/Gender: Oct 15, 1945 (70 y.o. Male) Treating [name] Primary Care  Physician: [place] Physician/Extender: G Referring Physician: [place] Weeks in Treatment: 32 Education Assessment Education Provided To: Patient Education Topics Provided Wound/Skin Impairment: Handouts: Caring for Your Ulcer, Other: countinue wound care as prescribed Methods: Demonstration, Explain/Verbal Responses: State content correctly Electronic Signature(s) Signed: 10/02/2016 5:09:04 PM By: Elliot Gurney, RN, BSN, Kim RN, BSN Entered By: Elliot Gurney, RN, BSN, Kim on 10/02/2016 08:49:59 [name] ([id number]) -------------------------------------------------------------------------------- Wound Assessment Details Patient Name: [name] Date of Service: 10/02/2016 8:00 AM Medical Record Number: [id number] Patient Account Number: [id number] Date of Birth/Sex: [birth year] (71 y.o. Male) Treating RN: Huel Coventry Primary Care Jennelle Pinkstaff: [place] Other Clinician: Referring Briggett Tuccillo: [place] Treating Mickael Mcnutt/Extender: [name] Weeks in Treatment: 37 Wound Status Wound Number: 1 Primary Venous Leg Ulcer Etiology: Wound Location: Right Malleolus - Lateral Wound Open Wounding Event: Gradually Appeared Status: Date Acquired: 08/11/2015 Comorbid Cataracts, Chronic Obstructive Weeks Of Treatment: 37 History: Pulmonary Disease (COPD), Clustered Wound: No Osteoarthritis Photos Photo Uploaded By: Elliot Gurney, RN, BSN, Kim on 10/02/2016 08:25:21 Wound Measurements Length: (cm) 6 Width: (cm) 5.6 Depth: (cm) 0.3 Area: (cm) 26.389 Volume: (cm) 7.917 % Reduction in Area: -52.7% % Reduction in Volume: -52.7% Epithelialization: None Tunneling: No Undermining: No Wound Description Full Thickness Without Exposed Foul Odor After Classification: Support Structures Due to Product Wound Margin: Thickened Slough/Fibrino Exudate Large Amount: Exudate Type: Purulent Exudate Color: yellow, brown, green Cleansing: Yes Use: No Yes Wound  Bed Granulation Amount: Small (1-33%) Exposed Structure Granulation Quality: Pink, Hyper-granulation Fascia Exposed: No Necrotic Amount: Large (67-100%) Fat Layer (Subcutaneous Tissue) Exposed: Yes Necrotic Quality: Adherent Slough Tendon Exposed: No [name], [name] ([id number]) Muscle Exposed: No Joint Exposed: No Bone Exposed: No Periwound Skin Texture Texture Color No Abnormalities Noted: No No Abnormalities Noted: No Callus: No Atrophie Blanche: No Crepitus: No Cyanosis: No Excoriation: No Ecchymosis: No Induration: No Erythema: No Rash: No Hemosiderin Staining: Yes Scarring: Yes Mottled: No Pallor: No Moisture Rubor: No No Abnormalities Noted: No Dry / Scaly: No Temperature / Pain Maceration: Yes Temperature: No Abnormality Tenderness on Palpation: Yes Wound Preparation Ulcer Cleansing: Rinsed/Irrigated with Saline, Other: soap and water, Topical Anesthetic Applied: Other: lidocaine 4%, Treatment Notes Wound #1 (Right, Lateral Malleolus) 1. Cleansed with: Clean wound with Normal Saline 2. Anesthetic Topical Lidocaine 4% cream to wound bed prior to debridement 4. Dressing Applied: Iodoflex 5. Secondary Dressing Applied ABD Pad 7. Secured with 3 Layer Compression System - Right Lower Extremity Electronic Signature(s) Signed: 10/02/2016 5:09:04 PM By: Elliot Gurney, RN, BSN, Kim RN, BSN Entered By: Elliot Gurney, RN, BSN, Kim on 10/02/2016 08:19:37 [name] ([id number]) -------------------------------------------------------------------------------- Wound Assessment Details Patient Name: [name] Date of Service: 10/02/2016 8:00 AM Medical Record Number: [id number] Patient Account Number: [id number] Date of Birth/Sex: 1945-11-10 (71 y.o. Male) Treating RN: Huel Coventry Primary Care Lunetta Marina: [place], [place] Other Clinician: Referring Geneveive Furness: [place] Treating Keniah Klemmer/Extender: [name] Weeks in Treatment: 37 Wound Status Wound  Number: 2 Primary Etiology: Venous Leg Ulcer Wound Location: Right, Medial Malleolus Wound Status: Open Wounding Event: Gradually Appeared Date Acquired: 01/30/2016 Weeks Of Treatment: 35 Clustered Wound: Yes Photos Photo Uploaded By: Elliot Gurney, RN, BSN, Kim on 10/02/2016 08:25:21 Wound Measurements Length: (  cm) 4 Width: (cm) 3 Depth: (cm) 0.2 Area: (cm) 9.425 Volume: (cm) 1.885 % Reduction in Area: 18.4% % Reduction in Volume: -63.2% Wound Description Full Thickness Without Exposed Classification: Support Structures Periwound Skin Texture Texture Color No Abnormalities Noted: No No Abnormalities Noted: No Moisture No Abnormalities Noted: No Treatment Notes Wound #2 (Right, Medial Malleolus) 1. Cleansed with: Clean wound with Normal Saline [name], [name] 2. Anesthetic Topical Lidocaine 4% cream to wound bed prior to debridement 4. Dressing Applied: Iodoflex 5. Secondary Dressing Applied ABD Pad 7. Secured with 3 Layer Compression System - Right Lower Extremity Electronic Signature(s) Signed: 10/02/2016 5:09:04 PM By: Elliot Gurney, RN, BSN, Kim RN, BSN Entered By: Elliot Gurney, RN, BSN, Kim on 10/02/2016 08:18:56 [name] ([id number]) -------------------------------------------------------------------------------- Vitals Details Patient Name: [name] Date of Service: 10/02/2016 8:00 AM Medical Record Number: [id number] Patient Account Number: [id number] Date of Birth/Sex: 1946/01/23 (72 y.o. Male) Treating RN: Huel Coventry Primary Care Edoardo Laforte: [place] Other Clinician: Referring Israella Hubert: [place] Treating Maryama Kuriakose/Extender: [name] Weeks in Treatment: 37 Vital Signs Time Taken: 08:12 Temperature (F): 98.4 Height (in): 76 Pulse (bpm): 79 Weight (lbs): 238 Respiratory Rate (breaths/min): 16 Body Mass Index (BMI): 29 Blood Pressure (mmHg): 118/61 Reference Range: 80 - 120 mg / dl Electronic Signature(s) Signed:  10/02/2016 5:09:04 PM By: Elliot Gurney, RN, BSN, Kim RN, BSN Entered By: Elliot Gurney, RN, BSN, Kim on 10/02/2016 08:65:78

## 2016-10-06 NOTE — Progress Notes (Signed)
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 continues to be unhealthy with Coles, Samnang [name]. ([id number]) thick gelatinous slough and nonviable tissue. The wound will not heal like this. 04/20/2016 -- the patient was here for a nurse visit but I was asked to see the patient as the slough was quite significant and the nurse needed for clarification regarding the ointment to be used. 04/24/16; the patient's wounds on the right medial and right lateral ankle/malleolus both look a lot better today. Less adherent slough healthier tissue. Dimensions better especially medially 05/01/16; the patient's wound surface continues to improve however he continues to require debridement switch her easier each week. Continue Santyl/Metahydrin mixture Hydrofera Blue next week. Still drainage on the medial aspect according to the intake nurse 05/08/16; still using Santyl and Medihoney. Still a lot of drainage per her intake nurse. Patient has no complaints pain fever chills etc. 05/15/16 switched the Hydrofera Blue last week. Dimensions down especially in the medial right leg wound. Area on the lateral which is more substantial also looks better still requires debridement 05/22/16; we have been using Hydrofera Blue. 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 Currently he tells me that the pain is doing very well about a 1 out of 10 today. Yesterday was a little bit worse but he  tells me that he was more active watering his flowers that day. Overall he feels that his symptoms are doing significantly better at this point in time. His edema continues to be controlled well with the 4-layer compression wrap and he really has not noted any odor at this point in time. He is tolerating the dressing changes when they are performed well. 06/05/16 at this point in time today patient currently shows no interval signs or symptoms of local or systemic infection. Again his pain level he rates to be a 1 out of 10 at most and overall he tells me that generally this is not giving him much trouble. In fact he even feels maybe a little bit better than last week. We have continue with the 4-layer compression wrap in which she tolerates very well at this point. He is continuing to utilize the [place]. 06/12/16 I think there has been some progression in the status of both of these wounds over today again covered in a gelatinous surface. Has been using Hydrofera Blue. We had used Iodoflex in the past I'm not sure if there was an issue other than changing to something that might progress towards closure faster 06/19/16; he did not tolerate the Flexeril last week secondary to pain and this was changed on Friday back to [facility] area he continues to have copious amounts of gelatinous surface slough  which is think inhibiting the speed of healing this area 06/26/16 patient over the last week has utilized the Santyl to try to loosen up some of the tightly adherent slough that was noted on evaluation last week. The good news is he tells me that the medial malleoli region really does not bother him the right llateral malleoli region is more tender to palpation at this point in time especially in the central/inferior location. However it does appear that the Santyl has done his job to loosen up the adherent slough at this point in time. Fortunately he has no interval signs or  symptoms of infection locally or systemically no purulent discharge noted. 07/03/16 at this point in time today patient's wounds appear to be significantly improved over the right medial and lateral malleolus locations. He has much less tenderness at this point in time and the wounds appear clean her although there is still adherent slough this is sufficiently improved over what I saw last week. I still see no evidence of local infection. 07/10/16; continued gradual improvement in the right medial and lateral malleolus locations. The lateral is more substantial wound now divided into 2 by a rim of normal epithelialization. 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 He denies any complaints of pain or intolerance to compression. Both ulcers are smaller in size oriented today's measurements, both are covered with a softly adherent slough. 07/24/16; medial wound is smaller, lateral about the same although surface looks better. Still using BENCE, TRAPP ([id number]) Hydrofera Blue 07/31/16; arrives today complaining of pain in the lateral part of his foot. Nurse reports a lot more drainage. He has been using Hydrofera Blue. Switch to silver alginate today 08/03/2016 -- I was asked to see the patient was here for a nurse visit today. I understand he had a lot of pain in his right lower extremity and was having blisters on his right foot which have not been there before. Though he started on doxycycline he does not have blisters elsewhere on his body. I do not believe this is a drug allergy. also mentioned that there was a copious purulent discharge from the wound and clinically there is no evidence of cellulitis. 08/07/16; I note that the patient came in for his nurse check on Friday apparently with blisters on his toes on the right than a lot of swelling in his forefoot. He  continued on the doxycycline that I had prescribed on 12/8. A culture was done of the lateral wound that showed a combination of a few Proteus and Pseudomonas. Doxycycline might of covered the Proteus but would be unlikely to cover the Pseudomonas. He is on Coumadin. He arrives in the clinic today feeling a lot better states the pain is a lot better but nothing specific really was done other than to rewrap the foot also noted that he had arterial studies ordered in August although these were never done. It is reasonable to go ahead and reorder these. 08/14/16; generally arrives in a better state today in terms of the wounds he has taken cefdinir for one week. Our intake nurse reports copious amounts of drainage but the patient is complaining of much less pain. He is not had his PT and INR checked and I've asked him to do this today or tomorrow. 08/24/2016 -- patient arrives today after 10 days and said he had a stomach upset. His arterial study was  done and I have reviewed this report and find it to be within normal limits. However I did not note any venous duplex studies for reflux, and Dr. Leanord Hawking may have ordered these in the past but I will leave it to him to decide if he needs these. The patient has finished his course of cefdinir. 08/28/16; patient arrives today again with copious amounts of thick really green drainage for our intake nurse. He states he has a very tender spot at the superior part of the lateral wound. Wounds are larger 09/04/16; no real change in the condition of this patient's wound still copious amounts of surface slough. Started him on Iodoflex last week he is completing another course of Cefdinir or which I think was done empirically. His arterial study showed ABIs were 1.1 on the right 1.5 on the left. He did have a slightly reduced ABI in the right the left one was not obtained. Had calcification of the right posterior tibial artery. The interpretation was no segmental  stenosis. His waveforms were triphasic. His reflux studies are later this month. Depending on this I'll send him for a vascular consultation, he may need to see plastic surgery as I believe he is had plastic surgery on this foot in the past. He had an injury to the foot in the 1980s. 1/16 /18 right lateral greater than right medial ankle wounds on the right in the setting of previous skin grafting. 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 He does not have arterial issues. Each week he comes in with the same adherent surface slough although there was less of this today 09/18/16; right lateral greater than right medial lower extremity wounds in the setting of previous skin grafting and trauma. He has least to vein laser ablation scheduled for February 2 for venous reflux. He does not have significant arterial disease. Problem has been very difficult to handle surface slough/necrotic tissue. Recently using Iodoflex for this with some, albeit slow improvement 09/25/16; right lateral greater than right medial lower extremity venous wounds in the setting of previous skin grafting. He is going for ablation surgery on February 2 after this he'll come back here for rewrap. He has been using Iodoflex as the primary dressing. 10/02/16; right lateral greater than right medial lower extremity wounds in the setting of previous skin grafting. He had his ablation surgery last week, I don't have a report. He tolerated this well. Came in with a thigh- high Unna boots on Friday. We have been using Iodoflex as the primary dressing. His measurements are improving Groome, Jagdeep [name]. ([id number]) Objective Constitutional Sitting or standing Blood Pressure is within target range for patient.. Pulse regular and within target range for patient.Marland Kitchen Respirations regular, non-labored and within target range.. Temperature is normal and  within the target range for the patient.. Patient's appearance is neat and clean. Appears in no acute distress. Well nourished and well developed.. Vitals Time Taken: 8:12 AM, Height: 76 in, Weight: 238 lbs, BMI: 29, Temperature: 98.4 F, Pulse: 79 bpm, Respiratory Rate: 16 breaths/min, Blood Pressure: 118/61 mmHg. Cardiovascular Pedal pulses palpable and strong bilaterally.. No edema is present. No erythema around either wound.. General Notes: Wound exam; wounds are smaller. Still covered in an adherent gelatinous necrotic surface. Debridement with a #5 curet to remove this surface which he tolerates marginally due to pain. Hemostasis with direct pressure. Post debridement there is visible healthy  red granulation which is a large improvement over one to 2 months ago. There is no evidence of an arterial issue here Integumentary (Hair, Skin) Wound #1 status is Open. Original cause of wound was Gradually Appeared. The wound is located on the Right,Lateral Malleolus. The wound measures 6cm length x 5.6cm width x 0.3cm depth; 26.389cm^2 area and 7.917cm^3 volume. There is Fat Layer (Subcutaneous Tissue) Exposed exposed. There is no tunneling or undermining noted. There is a large amount of purulent drainage noted. The wound margin is thickened. There is small (1-33%) pink granulation within the wound bed. There is a large (67-100%) amount of necrotic tissue within the wound bed including Adherent Slough. The periwound skin appearance exhibited: Scarring, Maceration, Hemosiderin Staining. The periwound skin appearance did not exhibit: Callus, Crepitus, Excoriation, Induration, Rash, Dry/Scaly, Atrophie Blanche, Cyanosis, Ecchymosis, Mottled, Pallor, Rubor, Erythema. Periwound temperature was noted as No Abnormality. The periwound has tenderness on palpation. Wound #2 status is Open. Original cause of wound was Gradually Appeared. The wound is located on the Right,Medial Malleolus. The wound  measures 4cm length x 3cm width x 0.2cm depth; 9.425cm^2 area and 1.885cm^3 volume. 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 There was a Skin/Subcutaneous Tissue Debridement ([id number]) debridement with total area of 33.6 sq cm performed by [name], MD. with the following instrument(s): Curette to remove Viable tissue/material including Fat Layer (and Subcutaneous Tissue) Exposed, Fibrin/Slough, and Subcutaneous after achieving pain control using Other (lidocaine 4%). A time out was conducted at 08:25, prior to the start of the procedure. A Large amount of bleeding was controlled with Pressure. The procedure was tolerated well with a pain level of 3 throughout and a pain level of 1 following the procedure. Post Debridement Measurements: 6cm length x 5.6cm width x 0.3cm depth; 7.917cm^3 volume. Character of Wound/Ulcer Post Debridement requires further debridement. Severity of Tissue Post Debridement is: Fat layer exposed. Post procedure Diagnosis Wound #1: Same as Pre-Procedure Plan Wound Cleansing: Wound #1 Right,Lateral Malleolus: Cleanse wound with mild soap and water May shower with protection. No tub bath. Wound #2 Right,Medial Malleolus: Cleanse wound with mild soap and water May shower with protection. No tub bath. Anesthetic: Wound #1 Right,Lateral Malleolus: Topical Lidocaine 4% cream applied to wound bed prior to debridement Wound #2 Right,Medial Malleolus: Topical Lidocaine 4% cream  applied to wound bed prior to debridement Skin Barriers/Peri-Wound Care: Wound #1 Right,Lateral Malleolus: Barrier cream - zinc oxide Triamcinolone Acetonide Ointment Primary Wound Dressing: Wound #1 Right,Lateral Malleolus: Dimarco, Anthony [name]. ([id number]) Iodoflex Wound #2 Right,Medial Malleolus: Iodoflex Secondary Dressing: Wound #1 Right,Lateral Malleolus: ABD pad Wound #2 Right,Medial Malleolus: ABD pad Dressing Change Frequency: Wound #1 Right,Lateral Malleolus: Change dressing every week Wound #2 Right,Medial Malleolus: Change dressing every week Follow-up Appointments: Wound #1 Right,Lateral Malleolus: Return Appointment in 1 week. Other: - nurse visit Friday Wound #2 Right,Medial Malleolus: Return Appointment in 1 week. Other: - nurse visit Friday Edema Control: Wound #1 Right,Lateral Malleolus: 3 Layer Compression System - Right Lower Extremity - UNNA TO ANCHOR Elevate legs to the level of the heart and pump ankles as often as possible  Wound #2 Right,Medial Malleolus: 3 Layer Compression System - Right Lower Extremity - UNNA TO ANCHOR Elevate legs to the level of the heart and pump ankles as often as possible Additional Orders / Instructions: Wound #1 Right,Lateral Malleolus: Increase protein intake. OK to return to work with the following restrictions: Activity as tolerated Wound #2 Right,Medial Malleolus: Increase protein intake. OK to return to work with the following restrictions: Activity as tolerated no change to iodflex. We seem to be making good gains in derms of the dimensions and the condition off the wound no evidence of ischmia or infection Electronic Signature(s) BENJAMYN, HESTAND ([id number]) Signed: 10/03/2016 4:28:51 PM By: [name] MD Entered By: [name] on 10/02/2016 08:41:09 [name] ([id number]) -------------------------------------------------------------------------------- SuperBill Details [name] Date of Service: 10/02/2016 Patient Name: [name]. Patient Account Number: [id number] Medical Record Treating RN: [name] [id number] Number: Other Clinician: Date of Birth/Sex: Feb 05, 1946 (70 y.o. 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, initial T85.613A encounter Facility Procedures CPT4: Description Modifier Quantity Code 08657846 11042 - DEB SUBQ TISSUE 20 SQ CM/< 1 ICD-10 Description Diagnosis L97.212 Non-pressure chronic ulcer of right calf with fat layer exposed I87.331 Chronic venous hypertension (idiopathic) with ulcer and  inflammation of right lower extremity CPT4: [id number] 11045 - DEB SUBQ TISS EA ADDL 20CM 1 ICD-10 Description Diagnosis L97.212 Non-pressure chronic ulcer of right calf with fat layer exposed Physician Procedures CPT4: Description Modifier Quantity Code [id number] 11042 - WC PHYS SUBQ TISS 20 SQ CM 1 ICD-10 Description Diagnosis L97.212 Non-pressure chronic ulcer of right calf with fat layer exposed I87.331 Chronic venous hypertension (idiopathic) with ulcer and  inflammation of right lower extremity ALDAIR, RICKEL ([id number]) Electronic Signature(s) Signed: 10/03/2016 4:28:51 PM By: [name] MD Entered By: [name] on 10/02/2016 08:41:43

## 2016-10-09 ENCOUNTER — Encounter: Payer: Medicare HMO | Admitting: Internal Medicine

## 2016-10-09 ENCOUNTER — Other Ambulatory Visit
Admission: RE | Admit: 2016-10-09 | Discharge: 2016-10-09 | Disposition: A | Discharge status: Home / Self Care | Payer: Medicare HMO | Source: Ambulatory Visit | Attending: Internal Medicine | Admitting: Internal Medicine

## 2016-10-09 DIAGNOSIS — B999 Unspecified infectious disease: Secondary | ICD-10-CM | POA: Diagnosis present

## 2016-10-09 DIAGNOSIS — I87331 Chronic venous hypertension (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: Secondary | ICD-10-CM | POA: Diagnosis not present

## 2016-10-10 ENCOUNTER — Telehealth (INDEPENDENT_AMBULATORY_CARE_PROVIDER_SITE_OTHER): Payer: Self-pay

## 2016-10-10 NOTE — Telephone Encounter (Signed)
Patient wife called stating that Mr [name] had a small tip size wound from incision but the wound center is providing wound care for the patient

## 2016-10-10 NOTE — Progress Notes (Signed)
REFOEL, PALLADINO ([id number]) Visit Report for 10/09/2016 Arrival Information Details Patient Name: [name], [name]. Date of Service: 10/09/2016 8:00 AM Medical Record Number: [id number] Patient Account Number: [id number] Date of Birth/Sex: 1946-01-19 (71 y.o. Male) Treating RN: [name] Primary Care Ejay Lashley: [facility] [place] Other Clinician: Referring Annjanette Wertenberger: [facility] [place] Treating Sabatino Williard/Extender: Altamese Horse Shoe in Treatment: 38 Visit Information History Since Last Visit All ordered tests and consults were completed: No Patient Arrived: Ambulatory Added or deleted any medications: No Arrival Time: 08:00 Any new allergies or adverse reactions: No Accompanied By: self Had a fall or experienced change in No Transfer Assistance: None activities of daily living that may affect Patient Identification Verified: Yes risk of falls: Secondary Verification Process Yes Signs or symptoms of abuse/neglect since last No Completed: visito Patient Requires Transmission-Based No Hospitalized since last visit: No Precautions: Has Dressing in Place as Prescribed: Yes Patient Has Alerts: No Has Compression in Place as Prescribed: Yes Pain Present Now: No Electronic Signature(s) Signed: 10/09/2016 4:37:07 PM By: Alejandro Mulling Entered By: Alejandro Mulling on 10/09/2016 08:01:10 [name] ([id number]) -------------------------------------------------------------------------------- Encounter Discharge Information Details Patient Name: [name] Date of Service: 10/09/2016 8:00 AM Medical Record Number: [id number] Patient Account Number: [id number] Date of Birth/Sex: 12/22/1945 (71 y.o. Male) Treating RN: [name] Primary Care Sherina Stammer: [facility] [place] Other Clinician: Referring Xitlally Mooneyham: [facility] [place] Treating Kara Mierzejewski/Extender: [name] Weeks in Treatment: 49 Encounter Discharge Information Items Discharge Pain Level: 0 Discharge  Condition: Stable Ambulatory Status: Ambulatory Discharge Destination: Home Private Transportation: Auto Accompanied By: self Schedule Follow-up Appointment: Yes Medication Reconciliation completed and No provided to Patient/Care Tammye Kahler: Clinical Summary of Care: Electronic Signature(s) Signed: 10/09/2016 4:37:07 PM By: Alejandro Mulling Entered By: Alejandro Mulling on 10/09/2016 08:31:25 [name] ([id number]) -------------------------------------------------------------------------------- Lower Extremity Assessment Details Patient Name: [name] Date of Service: 10/09/2016 8:00 AM Medical Record Number: [id number] Patient Account Number: [id number] Date of Birth/Sex: 07/18/1946 (71 y.o. Male) Treating RN: [name] Primary Care Jovee Dettinger: [facility] [place] Other Clinician: Referring Frederich Montilla: [facility] [place] Treating Ferd Horrigan/Extender: [name] Weeks in Treatment: 38 Vascular Assessment Pulses: Dorsalis Pedis Palpable: [Right:Yes] Posterior Tibial Extremity colors, hair growth, and conditions: Extremity Color: [Right:Normal] Temperature of Extremity: [Right:Warm] Capillary Refill: [Right:< 3 seconds] Electronic Signature(s) Signed: 10/09/2016 4:37:07 PM By: Alejandro Mulling Entered By: Alejandro Mulling on 10/09/2016 08:16:24 [name] ([id number]) -------------------------------------------------------------------------------- Multi Wound Chart Details Patient Name: [name] Date of Service: 10/09/2016 8:00 AM Medical Record Number: [id number] Patient Account Number: [id number] Date of Birth/Sex: May 12, 1946 (71 y.o. Male) Treating RN: [name] Primary Care Maximiano Lott: [facility] [place] Other Clinician: Referring Baya Lentz: [facility] [place] Treating Cyrah Mclamb/Extender: [name] Weeks in Treatment: 38 Vital Signs Height(in): 76 Pulse(bpm): 72 Weight(lbs): 238 Blood Pressure 151/80 (mmHg): Body Mass  Index(BMI): 29 Temperature(F): 98.5 Respiratory Rate 18 (breaths/min): Photos: [1:No Photos] [2:No Photos] [3:No Photos] Wound Location: [1:Right Malleolus - Lateral] [2:Right Malleolus - Medial] [3:Right Lower Leg - Posterior] Wounding Event: [1:Gradually Appeared] [2:Gradually Appeared] [3:Surgical Injury] Primary Etiology: [1:Venous Leg Ulcer] [2:Venous Leg Ulcer] [3:Open Surgical Wound] Comorbid History: [1:Cataracts, Chronic Obstructive Pulmonary Disease (COPD), Osteoarthritis] [2:Cataracts, Chronic Obstructive Pulmonary Disease (COPD), Osteoarthritis] [3:Cataracts, Chronic Obstructive Pulmonary Disease (COPD), Osteoarthritis] Date Acquired: [1:08/11/2015] [2:01/30/2016] [3:10/08/2016] Weeks of Treatment: [1:38] [2:36] [3:0] Wound Status: [1:Open] [2:Open] [3:Open] Clustered Wound: [1:No] [2:Yes] [3:No] Measurements L x W x D 7.9x8.5x0.2 [2:4x3.7x0.2] [3:0.5x0.5x1] (cm) Area (cm) : [1:52.739] [2:11.624] [3:0.196] Volume (cm) : [1:10.548] [2:2.325] [3:0.196] % Reduction in Area: [1:-205.20%] [2:-0.70%] [3:N/A] % Reduction  in Volume: -103.50% [2:-101.30%] [3:N/A] Classification: [1:Full Thickness Without Exposed Support Structures] [2:Full Thickness Without Exposed Support Structures] [3:Partial Thickness] Exudate Amount: [1:Large] [2:Large] [3:Large] Exudate Type: [1:Purulent] [2:Serous] [3:Serosanguineous] Exudate Color: [1:yellow, brown, green] [2:amber] [3:red, brown] Foul Odor After [1:Yes] [2:No] [3:No] Cleansing: Odor Anticipated Due to No [2:N/A] [3:N/A] Product Use: Wound Margin: [1:Thickened] [2:Distinct, outline attached Distinct, outline attached] Granulation Amount: Small (1-33%) Small (1-33%) Large (67-100%) Granulation Quality: Pink, Hyper-granulation Pink Red, Pink Necrotic Amount: Large (67-100%) Large (67-100%) None Present (0%) Exposed Structures: Fat Layer (Subcutaneous Fat Layer (Subcutaneous Fascia: No Tissue) Exposed: Yes Tissue) Exposed: Yes Fat Layer  (Subcutaneous Fascia: No Fascia: No Tissue) Exposed: No Tendon: No Tendon: No Tendon: No Muscle: No Muscle: No Muscle: No Joint: No Joint: No Joint: No Bone: No Bone: No Bone: No Limited to Skin Breakdown Epithelialization: None None None Debridement: Debridement (16109- Debridement (60454- N/A 11047) 11047) Pre-procedure 08:21 08:21 N/A Verification/Time Out Taken: Pain Control: Lidocaine 4% Topical Lidocaine 4% Topical N/A Solution Solution Tissue Debrided: Fibrin/Slough, Exudates, Fibrin/Slough, Exudates, N/A Subcutaneous Subcutaneous Level: Skin/Subcutaneous Skin/Subcutaneous N/A Tissue Tissue Debridement Area (sq 67.15 14.8 N/A cm): Instrument: Other(scoop) Other(scoop) N/A Bleeding: Minimum Minimum N/A Hemostasis Achieved: Pressure Pressure N/A Procedural Pain: 0 0 N/A Post Procedural Pain: 0 0 N/A Debridement Treatment Procedure was tolerated Procedure was tolerated N/A Response: well well Post Debridement 7.9x8.5x0.3 4x3.7x0.2 N/A Measurements L x W x D (cm) Post Debridement 15.822 2.325 N/A Volume: (cm) Periwound Skin Texture: Scarring: Yes No Abnormalities Noted No Abnormalities Noted Excoriation: No Induration: No Callus: No Crepitus: No Rash: No Periwound Skin Maceration: Yes Maceration: Yes No Abnormalities Noted Moisture: Dry/Scaly: No Periwound Skin Color: Hemosiderin Staining: Yes No Abnormalities Noted No Abnormalities Noted Atrophie Blanche: No Cyanosis: No Ecchymosis: No [name], [name] ([id number]) Erythema: No Mottled: No Pallor: No Rubor: No Temperature: No Abnormality No Abnormality No Abnormality Tenderness on Yes Yes Yes Palpation: Wound Preparation: Ulcer Cleansing: Ulcer Cleansing: Ulcer Cleansing: Rinsed/Irrigated with Rinsed/Irrigated with Rinsed/Irrigated with Saline, Other: soap and Saline, Other: soap and Saline, Other: soap and water water water Topical Anesthetic Topical Anesthetic Topical  Anesthetic Applied: Other: lidocaine Applied: Other: lidocaine Applied: Other 4% 4% Procedures Performed: Debridement Debridement N/A Treatment Notes Electronic Signature(s) Signed: 10/10/2016 7:47:19 AM By: [name] MD Entered By: [name] on 10/09/2016 08:30:23 [name] ([id number]) -------------------------------------------------------------------------------- Multi-Disciplinary Care Plan Details Patient Name: [name] Date of Service: 10/09/2016 8:00 AM Medical Record Number: [id number] Patient Account Number: [id number] Date of Birth/Sex: 03-23-1946 (71 y.o. Male) Treating RN: [name] Primary Care Nashali Ditmer: [facility] [place] Other Clinician: Referring Cleda Imel: [facility] [place] Treating Jessic Standifer/Extender: [name] Weeks in Treatment: 38 Active Inactive ` Venous Leg Ulcer Nursing Diagnoses: Knowledge deficit related to disease process and management Goals: Patient will maintain optimal edema control Date Initiated: 04/03/2016 Target Resolution Date: 11/24/2016 Goal Status: Active Interventions: Compression as ordered Treatment Activities: Therapeutic compression applied : 04/03/2016 Notes: ` Wound/Skin Impairment Nursing Diagnoses: Impaired tissue integrity Goals: Ulcer/skin breakdown will heal within 14 weeks Date Initiated: 01/17/2016 Target Resolution Date: 11/24/2016 Goal Status: Active Interventions: Assess ulceration(s) every visit Notes: Electronic Signature(s) Signed: 10/09/2016 4:37:07 PM By: Aurelio Jew, [name] ([id number]) Entered By: Alejandro Mulling on 10/09/2016 08:18:02 [name] ([id number]) -------------------------------------------------------------------------------- Pain Assessment Details Patient Name: [name] Date of Service: 10/09/2016 8:00 AM Medical Record Number: [id number] Patient Account Number: [id number] Date of Birth/Sex: 03/09/1946 (71 y.o.  Male) Treating RN: [name] Primary Care Scarlet Abad: [facility] [place] Other Clinician: Referring Avner Stroder:  [facility] [place] Treating Hameed Kolar/Extender: Maxwell CaulOBSON, [name] G Weeks in Treatment: 38 Active Problems Location of Pain Severity and Description of Pain Patient Has Paino No Site Locations With Dressing Change: No Pain Management and Medication Current Pain Management: Electronic Signature(s) Signed: 10/09/2016 4:37:07 PM By: Alejandro MullingPinkerton, Debra Entered By: Alejandro MullingPinkerton, Debra on 10/09/2016 08:00:55 [name], [name] ([id number]) -------------------------------------------------------------------------------- Patient/Caregiver Education Details [name], [name] Date of Service: 10/09/2016 8:00 AM Patient Name: C. Patient Account Number: [id number] Medical Record Treating RN: Phillis Haggisinkerton, [name] [id number] Number: Other Clinician: Date of Birth/Gender: 1945/09/30 (70 y.o. Male) Treating Baltazar NajjarOBSON, [name] Primary Care Physician: [place] Physician/Extender: G Referring Physician: [facility] [place] Weeks in Treatment: 7238 Education Assessment Education Provided To: Patient Education Topics Provided Wound/Skin Impairment: Handouts: Other: change dressing as ordered Methods: Demonstration, Explain/Verbal Responses: State content correctly Electronic Signature(s) Signed: 10/09/2016 4:37:07 PM By: Alejandro MullingPinkerton, Debra Entered By: Alejandro MullingPinkerton, Debra on 10/09/2016 08:49:04 [name], [name] ([id number]) -------------------------------------------------------------------------------- Wound Assessment Details Patient Name: [name], Darreld C. Date of Service: 10/09/2016 8:00 AM Medical Record Number: [id number] Patient Account Number: [id number] Date of Birth/Sex: 1945/09/30 69(70 y.o. Male) Treating RN: Phillis HaggisPinkerton, [name] Primary Care Giulliana Mcroberts: [facility] [place] Other Clinician: Referring Calden Dorsey: [facility] [place] Treating Erionna Strum/Extender: Maxwell CaulOBSON, [name] G Weeks in Treatment: 38 Wound  Status Wound Number: 1 Primary Venous Leg Ulcer Etiology: Wound Location: Right Malleolus - Lateral Wound Open Wounding Event: Gradually Appeared Status: Date Acquired: 08/11/2015 Comorbid Cataracts, Chronic Obstructive Weeks Of Treatment: 38 History: Pulmonary Disease (COPD), Clustered Wound: No Osteoarthritis Photos Photo Uploaded By: Alejandro MullingPinkerton, Debra on 10/09/2016 14:59:49 Wound Measurements Length: (cm) 7.9 Width: (cm) 8.5 Depth: (cm) 0.2 Area: (cm) 52.739 Volume: (cm) 10.548 % Reduction in Area: -205.2% % Reduction in Volume: -103.5% Epithelialization: None Tunneling: No Undermining: No Wound Description Full Thickness Without Exposed Foul Odor After Classification: Support Structures Due to Product Wound Margin: Thickened Slough/Fibrino Exudate Large Amount: Exudate Type: Purulent Exudate Color: yellow, brown, green Cleansing: Yes Use: No Yes Wound Bed Granulation Amount: Small (1-33%) Exposed Structure Granulation Quality: Pink, Hyper-granulation Fascia Exposed: No [name], [name] ([id number]) Necrotic Amount: Large (67-100%) Fat Layer (Subcutaneous Tissue) Exposed: Yes Necrotic Quality: Adherent Slough Tendon Exposed: No Muscle Exposed: No Joint Exposed: No Bone Exposed: No Periwound Skin Texture Texture Color No Abnormalities Noted: No No Abnormalities Noted: No Callus: No Atrophie Blanche: No Crepitus: No Cyanosis: No Excoriation: No Ecchymosis: No Induration: No Erythema: No Rash: No Hemosiderin Staining: Yes Scarring: Yes Mottled: No Pallor: No Moisture Rubor: No No Abnormalities Noted: No Dry / Scaly: No Temperature / Pain Maceration: Yes Temperature: No Abnormality Tenderness on Palpation: Yes Wound Preparation Ulcer Cleansing: Rinsed/Irrigated with Saline, Other: soap and water, Topical Anesthetic Applied: Other: lidocaine 4%, Treatment Notes Wound #1 (Right, Lateral Malleolus) 1. Cleansed with: Clean wound with  Normal Saline Cleanse wound with antibacterial soap and water 2. Anesthetic Topical Lidocaine 4% cream to wound bed prior to debridement 4. Dressing Applied: Iodoflex 5. Secondary Dressing Applied ABD Pad Dry Gauze 7. Secured with Tape 3 Layer Compression System - Right Lower Extremity Notes darwtex, unna to anchor Electronic Signature(s) Signed: 10/09/2016 4:37:07 PM By: Alejandro MullingPinkerton, Debra Entered By: Alejandro MullingPinkerton, Debra on 10/09/2016 08:15:36 [name], [name] ([id number]) [name], [name] ([id number]) -------------------------------------------------------------------------------- Wound Assessment Details Patient Name: [name], [name] Date of Service: 10/09/2016 8:00 AM Medical Record Number: [id number] Patient Account Number: [id number] Date of Birth/Sex: 1945/09/30 89(70 y.o. Male) Treating RN: Phillis HaggisPinkerton, [name] Primary Care Shelisha Gautier: [facility] [place] Other Clinician: Referring Eathen Budreau: [facility] [place] Treating Deneshia Zucker/Extender: Maxwell CaulOBSON, [name] G Weeks in Treatment: 38 Wound Status  Wound Number: 2 Primary Venous Leg Ulcer Etiology: Wound Location: Right Malleolus - Medial Wound Open Wounding Event: Gradually Appeared Status: Date Acquired: 01/30/2016 Comorbid Cataracts, Chronic Obstructive Weeks Of Treatment: 36 History: Pulmonary Disease (COPD), Clustered Wound: Yes Osteoarthritis Photos Photo Uploaded By: Alejandro Mulling on 10/09/2016 15:00:10 Wound Measurements Length: (cm) 4 Width: (cm) 3.7 Depth: (cm) 0.2 Area: (cm) 11.624 Volume: (cm) 2.325 % Reduction in Area: -0.7% % Reduction in Volume: -101.3% Epithelialization: None Tunneling: No Undermining: No Wound Description Full Thickness Without Exposed Foul Odor After Classification: Support Structures Slough/Fibrino Wound Margin: Distinct, outline attached Exudate Large Amount: Exudate Type: Serous Exudate Color: amber Cleansing: No No Wound Bed Granulation Amount: Small (1-33%) Exposed  Structure Granulation Quality: Pink Fascia Exposed: No [name], [name] Necrotic Amount: Large (67-100%) Fat Layer (Subcutaneous Tissue) Exposed: Yes Necrotic Quality: Adherent Slough Tendon Exposed: No Muscle Exposed: No Joint Exposed: No Bone Exposed: No Periwound Skin Texture Texture Color No Abnormalities Noted: No No Abnormalities Noted: No Moisture Temperature / Pain No Abnormalities Noted: No Temperature: No Abnormality Maceration: Yes Tenderness on Palpation: Yes Wound Preparation Ulcer Cleansing: Rinsed/Irrigated with Saline, Other: soap and water, Topical Anesthetic Applied: Other: lidocaine 4%, Treatment Notes Wound #2 (Right, Medial Malleolus) 1. Cleansed with: Clean wound with Normal Saline Cleanse wound with antibacterial soap and water 2. Anesthetic Topical Lidocaine 4% cream to wound bed prior to debridement 4. Dressing Applied: Iodoflex 5. Secondary Dressing Applied ABD Pad Dry Gauze 7. Secured with Tape 3 Layer Compression System - Right Lower Extremity Notes darwtex, unna to anchor Electronic Signature(s) Signed: 10/09/2016 4:37:07 PM By: Alejandro Mulling Entered By: Alejandro Mulling on 10/09/2016 08:15:48 [name] ([id number]) -------------------------------------------------------------------------------- Wound Assessment Details Patient Name: [name] Date of Service: 10/09/2016 8:00 AM Medical Record Number: [id number] Patient Account Number: [id number] Date of Birth/Sex: [birth year] (71 y.o. Male) Treating RN: [name] Primary Care Austynn Pridmore: [facility] [place] Other Clinician: Referring Nhung Danko: [facility] [place] Treating Dmani Mizer/Extender: [name] Weeks in Treatment: 38 Wound Status Wound Number: 3 Primary Open Surgical Wound Etiology: Wound Location: Right Lower Leg - Posterior Wound Open Wounding Event: Surgical Injury Status: Date Acquired: 10/08/2016 Comorbid Cataracts, Chronic  Obstructive Weeks Of Treatment: 0 History: Pulmonary Disease (COPD), Clustered Wound: No Osteoarthritis Photos Photo Uploaded By: Alejandro Mulling on 10/09/2016 15:01:10 Wound Measurements Length: (cm) 0.5 Width: (cm) 0.5 Depth: (cm) 1 Area: (cm) 0.196 Volume: (cm) 0.196 % Reduction in Area: % Reduction in Volume: Epithelialization: None Tunneling: No Undermining: No Wound Description Classification: Partial Thickness Foul Odor Af Wound Margin: Distinct, outline attached Slough/Fibri Exudate Amount: Large Exudate Type: Serosanguineous Exudate Color: red, brown ter Cleansing: No no No Wound Bed Granulation Amount: Large (67-100%) Exposed Structure Granulation Quality: Red, Pink Fascia Exposed: No Necrotic Amount: None Present (0%) Fat Layer (Subcutaneous Tissue) Exposed: No Tendon Exposed: No Schmid, Carrell C. ([id number]) Muscle Exposed: No Joint Exposed: No Bone Exposed: No Limited to Skin Breakdown Periwound Skin Texture Texture Color No Abnormalities Noted: No No Abnormalities Noted: No Moisture Temperature / Pain No Abnormalities Noted: No Temperature: No Abnormality Tenderness on Palpation: Yes Wound Preparation Ulcer Cleansing: Rinsed/Irrigated with Saline, Other: soap and water, Topical Anesthetic Applied: Other Treatment Notes Wound #3 (Right, Posterior Lower Leg) 1. Cleansed with: Clean wound with Normal Saline Cleanse wound with antibacterial soap and water 4. Dressing Applied: Iodoform packing Gauze 5. Secondary Dressing Applied Dry Gauze [place] Electronic Signature(s) Signed: 10/09/2016 4:37:07 PM By: Alejandro Mulling Entered By: Alejandro Mulling on 10/09/2016 62:95:28 [name] ([id number]) --------------------------------------------------------------------------------  Vitals Details Patient Name: FORRESTER, BLANDO. Date of Service: 10/09/2016 8:00 AM Medical Record Number: [id number] Patient Account Number:  [id number] Date of Birth/Sex: [birth year] (71 y.o. Male) Treating RN: [name] Primary Care Kristy Schomburg: [facility] [place] Other Clinician: Referring Raydan Schlabach: [facility] [place] Treating Payten Hobin/Extender: [name] Weeks in Treatment: 38 Vital Signs Time Taken: 08:03 Temperature (F): 98.5 Height (in): 76 Pulse (bpm): 72 Weight (lbs): 238 Respiratory Rate (breaths/min): 18 Body Mass Index (BMI): 29 Blood Pressure (mmHg): 151/80 Reference Range: 80 - 120 mg / dl Electronic Signature(s) Signed: 10/09/2016 4:37:07 PM By: Alejandro Mulling Entered By: Alejandro Mulling on 10/09/2016 08:05:25
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
## 2016-10-11 LAB — AEROBIC CULTURE W GRAM STAIN (SUPERFICIAL SPECIMEN): Culture: NO GROWTH

## 2016-10-11 LAB — AEROBIC CULTURE  (SUPERFICIAL SPECIMEN)

## 2016-10-16 ENCOUNTER — Encounter: Payer: Medicare HMO | Admitting: Internal Medicine

## 2016-10-16 DIAGNOSIS — I87331 Chronic venous hypertension (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: Secondary | ICD-10-CM | POA: Diagnosis not present

## 2016-10-17 NOTE — Progress Notes (Signed)
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 the Iodoflex was hurting his wound and since that is been changed he has had no pain unfortunately the surface of the wound continues to be unhealthy with Herter, Yonas C. ([id number]) thick gelatinous slough and nonviable tissue. The wound will not heal like this. 04/20/2016 -- the patient was here for a nurse visit but I was asked to see the patient as the slough was quite significant and the nurse needed for clarification regarding the ointment to be used. 04/24/16; the patient's wounds on the right medial and right lateral ankle/malleolus both look a lot better today. Less adherent slough healthier tissue. Dimensions better especially medially 05/01/16; the patient's wound surface continues to improve however he continues to require debridement switch her easier each week. Continue Santyl/Metahydrin mixture Hydrofera Blue next week. Still drainage on the medial aspect according to the intake nurse 05/08/16; still using Santyl and Medihoney. Still a lot of drainage per her intake nurse. Patient has no complaints pain fever chills etc. 05/15/16 switched the Hydrofera Blue last week. Dimensions down especially in the medial right leg wound. Area on the lateral which is more substantial also looks better still requires debridement 05/22/16; we have been using Hydrofera Blue. Dimensions of the wound are improved  especially medially although this continues to be a long arduous process 05/29/16 Patient is seen in follow-up today concerning the bimalleolar wounds to his right lower extremity. Currently he tells me that the pain is doing very well about a 1 out of 10 today. Yesterday was a little bit worse but he tells me that he was more active watering his flowers that day. Overall he feels that his symptoms are doing significantly better at this point in time. His edema continues to be controlled well with the 4-layer compression wrap and he really has not noted any odor at this point in time. He is tolerating the dressing changes when they are performed well. 06/05/16 at this point in time today patient currently shows no interval signs or symptoms of local or systemic infection. Again his pain level he rates to be a 1 out of 10 at most and overall he tells me that generally this is not giving him much trouble. In fact he even feels maybe a little bit better than last week. We have continue with the 4-layer compression wrap in which she tolerates very  well at this point. He is continuing to utilize the [place]. 06/12/16 I think there has been some progression in the status of both of these wounds over today again covered in a gelatinous surface. Has been using Hydrofera Blue. We had used Iodoflex in the past I'm not sure if there was an issue other than changing to something that might progress towards closure faster 06/19/16; he did not tolerate the Flexeril last week secondary to pain and this was changed on Friday back to [facility] area he continues to have copious amounts of gelatinous surface slough which is think inhibiting the speed of healing this area 06/26/16 patient over the last week has utilized the Santyl to try to loosen up some of the tightly adherent slough that was noted on evaluation last week. The good news is he tells me that the medial malleoli region really  does not bother him the right llateral malleoli region is more tender to palpation at this point in time especially in the central/inferior location. However it does appear that the Santyl has done his job to loosen up the adherent slough at this point in time. Fortunately he has no interval signs or symptoms of infection locally or systemically no purulent discharge noted. 07/03/16 at this point in time today patient's wounds appear to be significantly improved over the right medial and lateral malleolus locations. He has much less tenderness at this point in time and the wounds appear clean her although there is still adherent slough this is sufficiently improved over what I saw last week. I still see no evidence of local infection. 07/10/16; continued gradual improvement in the right medial and lateral malleolus locations. The lateral is more substantial wound now divided into 2 by a rim of normal epithelialization. 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 He denies any complaints of pain or intolerance to compression. Both ulcers are smaller in size oriented today's measurements, both are covered with a softly adherent slough. 07/24/16; medial wound is smaller, lateral about the same although surface looks better. Still using ALMIR, BOTTS ([id number]) Hydrofera Blue 07/31/16; arrives today complaining of pain in the lateral part of his foot. Nurse reports a lot more drainage. He has been using Hydrofera Blue. Switch to silver alginate today 08/03/2016 -- I was asked to see the patient was here for a nurse visit today. I understand he had a lot of pain in his right lower extremity and was having blisters on his right foot which have not been there before. Though he started on doxycycline he does not have blisters elsewhere on his body. I do not believe this is  a drug allergy. also mentioned that there was a copious purulent discharge from the wound and clinically there is no evidence of cellulitis. 08/07/16; I note that the patient came in for his nurse check on Friday apparently with blisters on his toes on the right than a lot of swelling in his forefoot. He continued on the doxycycline that I had prescribed on 12/8. A culture was done of the lateral wound that showed a combination of a few Proteus and Pseudomonas. Doxycycline might of covered the Proteus but would be unlikely to cover the Pseudomonas. He is on Coumadin. He arrives in the clinic today feeling a lot better states the pain is a lot better but nothing specific really was done other than to rewrap  the foot also noted that he had arterial studies ordered in August although these were never done. It is reasonable to go ahead and reorder these. 08/14/16; generally arrives in a better state today in terms of the wounds he has taken cefdinir for one week. Our intake nurse reports copious amounts of drainage but the patient is complaining of much less pain. He is not had his PT and INR checked and I've asked him to do this today or tomorrow. 08/24/2016 -- patient arrives today after 10 days and said he had a stomach upset. His arterial study was done and I have reviewed this report and find it to be within normal limits. However I did not note any venous duplex studies for reflux, and Dr. Leanord Hawking may have ordered these in the past but I will leave it to him to decide if he needs these. The patient has finished his course of cefdinir. 08/28/16; patient arrives today again with copious amounts of thick really green drainage for our intake nurse. He states he has a very tender spot at the superior part of the lateral wound. Wounds are larger 09/04/16; no real change in the condition of this patient's wound still copious amounts of surface slough. Started him on Iodoflex last week he is completing  another course of Cefdinir or which I think was done empirically. His arterial study showed ABIs were 1.1 on the right 1.5 on the left. He did have a slightly reduced ABI in the right the left one was not obtained. Had calcification of the right posterior tibial artery. The interpretation was no segmental stenosis. His waveforms were triphasic. His reflux studies are later this month. Depending on this I'll send him for a vascular consultation, he may need to see plastic surgery as I believe he is had plastic surgery on this foot in the past. He had an injury to the foot in the 1980s. 1/16 /18 right lateral greater than right medial ankle wounds on the right in the setting of previous skin grafting. 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 He does not have arterial issues. Each week he comes in with the same adherent surface slough although there was less of this today 09/18/16; right lateral greater than right medial lower extremity wounds in the setting of previous skin grafting and trauma. He has least to vein laser ablation scheduled for February 2 for venous reflux. He does not have significant arterial disease. Problem has been very difficult to handle surface slough/necrotic tissue. Recently using Iodoflex for this with some, albeit slow improvement 09/25/16; right lateral greater than right medial lower extremity venous wounds in the setting of previous skin grafting. He is going for ablation surgery on February 2 after this he'll come back here for rewrap. He has been using Iodoflex as the primary dressing. 10/02/16; right lateral greater than right medial lower extremity wounds in the setting of previous skin grafting. He had his ablation surgery last week, I don't have a report. He tolerated this well. Came in with a thigh- high Unna boots on Friday. We have been using Iodoflex as the primary dressing. His  measurements are improving 10/09/16; continues to make nice aggressive in terms of the wounds on his lateral and medial right ankle in the setting of previous skin grafting. Yesterday he noticed drainage at one of his surgical sites from his ORA, [facility] ([id number]) venous  ablation on the right calf. He took off the bandage over this area felt a "popping" sensation and a reddish-brown drainage. He is not complaining of any pain 10/16/16; he continues to make nice progression in terms of the wounds on the lateral and medial malleolus. Both smaller using Iodoflex. He had a surgical area in his posterior mid calf we have been using iodoform. All the wounds are down and dimensions Objective Constitutional Vitals Time Taken: 8:10 AM, Height: 76 in, Weight: 238 lbs, BMI: 29, Temperature: 98.5 F, Pulse: 74 bpm, Respiratory Rate: 18 breaths/min, Blood Pressure: 150/80 mmHg. Eyes Conjunctivae clear. No discharge.Marland Kitchen Respiratory Respiratory effort is easy and symmetric bilaterally. Rate is normal at rest and on room air.. Cardiovascular Pedal pulses palpable and strong bilaterally.. Lymphatic None palpable in the popliteal or inguinal area. Psychiatric No evidence of depression, anxiety, or agitation. Calm, cooperative, and communicative. Appropriate interactions and affect.. General Notes: Wound exam; 1. His original wound on the right ankle lateral this looks healthy. There is still some surface slough granulation looks healthy. No debridement #2 area on the medial right ankle also looks healthy. Dimensions improved no debridement. #3 the surgical area from his venous ablation in the right mid calf is down in depth. There is some edema surrounding this a week though we did not wrap this superiorly allowing him to change iodoform packing Integumentary (Hair, Skin) Wound #1 status is Open. Original cause of wound was Gradually Appeared. The wound is located on the Right,Lateral Malleolus.  The wound measures 7.9cm length x 7.4cm width x 0.2cm depth; 45.914cm^2 area and 9.183cm^3 volume. There is Fat Layer (Subcutaneous Tissue) Exposed exposed. There is no tunneling or undermining noted. There is a large amount of purulent drainage noted. The wound margin is thickened. There is small (1-33%) pink granulation within the wound bed. There is a large (67-100%) amount of necrotic tissue within the wound bed including Adherent Slough. The periwound skin appearance exhibited: Scarring, Maceration, Hemosiderin Staining. The periwound skin appearance did not exhibit: Callus, Crepitus, Excoriation, Induration, Rash, Dry/Scaly, Atrophie Blanche, Cyanosis, Ecchymosis, Mottled, Plazola, Damarien C. ([id number]) Pallor, Rubor, Erythema. Periwound temperature was noted as No Abnormality. The periwound has tenderness on palpation. Wound #2 status is Open. Original cause of wound was Gradually Appeared. The wound is located on the Right,Medial Malleolus. The wound measures 3.6cm length x 3.2cm width x 0.2cm depth; 9.048cm^2 area and 1.81cm^3 volume. There is Fat Layer (Subcutaneous Tissue) Exposed exposed. There is no tunneling or undermining noted. There is a large amount of serous drainage noted. The wound margin is distinct with the outline attached to the wound base. There is small (1-33%) pink granulation within the wound bed. There is a large (67-100%) amount of necrotic tissue within the wound bed including Adherent Slough. The periwound skin appearance exhibited: Maceration. Periwound temperature was noted as No Abnormality. The periwound has tenderness on palpation. Wound #3 status is Open. Original cause of wound was Surgical Injury. The wound is located on the Right,Posterior Lower Leg. The wound measures 0.5cm length x 0.5cm width x 0.4cm depth; 0.196cm^2 area and 0.079cm^3 volume. The wound is limited to skin breakdown. There is no tunneling or undermining noted. There is a large amount  of serosanguineous drainage noted. The wound margin is distinct with the outline attached to the wound base. There is large (67-100%) red, pink granulation within the wound bed. There is no necrotic tissue within the wound bed. Periwound temperature was noted as No Abnormality. The periwound has tenderness on  palpation. 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 No tub bath. Wound #2 Right,Medial Malleolus: Cleanse wound with mild soap and water ABHIJOT, STRAUGHTER ([id number]) May shower with protection. No tub bath. Wound #3 Right,Posterior Lower Leg: Cleanse wound with mild soap and water May shower with protection. No tub bath. Anesthetic: Wound #1 Right,Lateral Malleolus: Topical Lidocaine 4% cream applied to wound bed prior to debridement Wound #2 Right,Medial Malleolus: Topical Lidocaine 4% cream applied to wound bed prior to debridement Wound #3 Right,Posterior Lower Leg: Topical Lidocaine 4% cream applied to wound bed prior to debridement Skin Barriers/Peri-Wound Care: Wound #1 Right,Lateral Malleolus: Barrier cream - zinc oxide Triamcinolone Acetonide Ointment Primary Wound Dressing: Wound #1 Right,Lateral Malleolus: Iodoflex Wound #2 Right,Medial Malleolus: Iodoflex Wound #3 Right,Posterior Lower Leg: Aquacel  Ag Secondary Dressing: Wound #1 Right,Lateral Malleolus: ABD pad Drawtex Wound #2 Right,Medial Malleolus: ABD pad Drawtex Wound #3 Right,Posterior Lower Leg: ABD pad Dressing Change Frequency: Wound #1 Right,Lateral Malleolus: Change dressing every week Other: - nurse visit Friday Wound #2 Right,Medial Malleolus: Change dressing every week Other: - nurse visit Friday Wound #3 Right,Posterior Lower Leg: Change dressing every day. Other: - nurse visit Friday Follow-up Appointments: Wound #1 Right,Lateral Malleolus: Return Appointment in 1 week. Other: - nurse visit Friday Wound #2 Right,Medial Malleolus: Return Appointment in 1 week. Other: - nurse visit Friday Wound #3 Right,Posterior Lower Leg: DEVYON, KEATOR. ([id number]) Return Appointment in 1 week. Other: - nurse visit Friday Edema Control: Wound #1 Right,Lateral Malleolus: 3 Layer Compression System - Right Lower Extremity - UNNA TO ANCHOR Elevate legs to the level of the heart and pump ankles as often as possible Wound #2 Right,Medial Malleolus: 3 Layer Compression System - Right Lower Extremity - UNNA TO ANCHOR Elevate legs to the level of the heart and pump ankles as often as possible Wound #3 Right,Posterior Lower Leg: 3 Layer Compression System - Right Lower Extremity - UNNA TO ANCHOR Elevate legs to the level of the heart and pump ankles as often as possible Additional Orders / Instructions: Wound #1 Right,Lateral Malleolus: Increase protein intake. OK to return to work with the following restrictions: Activity as tolerated Wound #2 Right,Medial Malleolus: Increase protein intake. OK to return to work with the following restrictions: Activity as tolerated Wound #3 Right,Posterior Lower Leg: Increase protein intake. OK to return to work with the following restrictions: Activity as tolerated #1` no need to change iodoflex 2 Aquacel ag packing to small surgical wound on right posterior  calf Electronic Signature(s) Signed: 10/17/2016 9:58:44 AM By: [name] MD Entered By: [name] on 10/16/2016 08:47:01 [name] ([id number]) -------------------------------------------------------------------------------- SuperBill Details [name] Date of Service: 10/16/2016 Patient Name: C. Patient Account Number: [id number] Medical Record Treating RN: [name] [id number] Number: Other Clinician: Date of Birth/Sex: 12-19-1945 (71 y.o. 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, initial T85.613A encounter Disruption of external operation (surgical) wound, not elsewhere classified, subsequent T81.31XD encounter L97.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 of skin Facility Procedures CPT4: Description Modifier Quantity Code [id number] (Facility Use Only) [phone] - APPLY MULTLAY COMPRS LWR RT 1 LEG Physician Procedures CPT4: Description Modifier Quantity Code [id number] - WC PHYS LEVEL 3 - EST PT 1 ICD-10 Description Diagnosis I87.331 Chronic venous hypertension (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 Electronic Signature(s) Signed: 10/16/2016 4:45:40 PM By: Alejandro Mulling Signed: 10/17/2016 9:58:44 AM By: [name] MD Entered By: Alejandro Mulling on 10/16/2016 09:23:42

## 2016-10-17 NOTE — Progress Notes (Signed)
DWON, SKY ([id number]) Visit Report for 10/16/2016 Arrival Information Details Patient Name: [name], [name]. Date of Service: 10/16/2016 8:00 AM Medical Record Number: [id number] Patient Account Number: [id number] Date of Birth/Sex: [birth year] (71 y.o. Male) Treating RN: [name] Primary Care Winferd Wease: [place] Other Clinician: Referring Frankee Gritz: [place] [place] Treating Jmya Uliano/Extender: Altamese [place] in Treatment: 39 Visit Information History Since Last Visit All ordered tests and consults were completed: No Patient Arrived: Ambulatory Added or deleted any medications: No Arrival Time: 08:08 Any new allergies or adverse reactions: No Accompanied By: self Had a fall or experienced change in No Transfer Assistance: None activities of daily living that may affect Patient Identification Verified: Yes risk of falls: Secondary Verification Process Yes Signs or symptoms of abuse/neglect since last No Completed: visito Patient Requires Transmission-Based No Hospitalized since last visit: No Precautions: Pain Present Now: No Patient Has Alerts: No Electronic Signature(s) Signed: 10/16/2016 4:45:40 PM By: Alejandro Mulling Entered By: Alejandro Mulling on 10/16/2016 08:08:31 [name] ([id number]) -------------------------------------------------------------------------------- Encounter Discharge Information Details Patient Name: [name] Date of Service: 10/16/2016 8:00 AM Medical Record Number: [id number] Patient Account Number: [id number] Date of Birth/Sex: [birth year] (71 y.o. Male) Treating RN: [name] Primary Care Bernard Donahoo: [place], [place] Other Clinician: Referring Sehar Sedano: [place], [place] Treating Mecca Barga/Extender: [name] Weeks in Treatment: 39 Encounter Discharge Information Items Discharge Pain Level: 0 Discharge Condition: Stable Ambulatory Status: Ambulatory Discharge Destination:  Home Transportation: Private Auto Accompanied By: self Schedule Follow-up Appointment: Yes Medication Reconciliation completed No and provided to Patient/Care Jamie-Lee Galdamez: Patient Clinical Summary of Care: Declined Electronic Signature(s) Signed: 10/16/2016 8:40:18 AM By: Gwenlyn Perking Entered By: Gwenlyn Perking on 10/16/2016 08:40:18 [name] ([id number]) -------------------------------------------------------------------------------- Lower Extremity Assessment Details Patient Name: [name] Date of Service: 10/16/2016 8:00 AM Medical Record Number: [id number] Patient Account Number: [id number] Date of Birth/Sex: April 03, 1946 (71 y.o. Male) Treating RN: [name] Primary Care Gavriela Cashin: [place], [place] Other Clinician: Referring Aldine Chakraborty: [place], [place] Treating Merisa Julio/Extender: [name] Weeks in Treatment: 39 Vascular Assessment Pulses: Dorsalis Pedis Palpable: [Right:Yes] Posterior Tibial Extremity colors, hair growth, and conditions: Extremity Color: [Right:Normal] Temperature of Extremity: [Right:Warm] Capillary Refill: [Right:< 3 seconds] Electronic Signature(s) Signed: 10/16/2016 4:45:40 PM By: Alejandro Mulling Entered By: Alejandro Mulling on 10/16/2016 08:20:02 [name] ([id number]) -------------------------------------------------------------------------------- Multi Wound Chart Details Patient Name: [name] Date of Service: 10/16/2016 8:00 AM Medical Record Number: [id number] Patient Account Number: [id number] Date of Birth/Sex: September 28, 1945 (71 y.o. Male) Treating RN: [name] Primary Care Dajon Rowe: [facility] Other Clinician: Referring Tirso Laws: [place], [place] Treating Heron Pitcock/Extender: [name] Weeks in Treatment: 39 Photos: [1:No Photos] [2:No Photos] [3:No Photos] Wound Location: [1:Right Malleolus - Lateral] [2:Right Malleolus - Medial] [3:Right Lower Leg - Posterior] Wounding Event:  [1:Gradually Appeared] [2:Gradually Appeared] [3:Surgical Injury] Primary Etiology: [1:Venous Leg Ulcer] [2:Venous Leg Ulcer] [3:Open Surgical Wound] Comorbid History: [1:Cataracts, Chronic Obstructive Pulmonary Disease (COPD), Osteoarthritis] [2:Cataracts, Chronic Obstructive Pulmonary Disease (COPD), Osteoarthritis] [3:Cataracts, Chronic Obstructive Pulmonary Disease (COPD), Osteoarthritis] Date Acquired: [1:08/11/2015] [2:01/30/2016] [3:10/08/2016] Weeks of Treatment: [1:39] [2:37] [3:1] Wound Status: [1:Open] [2:Open] [3:Open] Clustered Wound: [1:No] [2:Yes] [3:No] Measurements L x W x D 7.9x7.4x0.2 [2:3.6x3.2x0.2] [3:0.5x0.5x0.4] (cm) Area (cm) : [1:45.914] [2:9.048] [3:0.196] Volume (cm) : [1:9.183] [2:1.81] [3:0.079] % Reduction in Area: [1:-165.70%] [2:21.60%] [3:0.00%] % Reduction in Volume: -77.10% [2:-56.70%] [3:59.70%] Classification: [1:Full Thickness Without Exposed Support Structures] [2:Full Thickness Without Exposed Support Structures] [3:Partial Thickness] Exudate Amount: [1:Large] [2:Large] [3:Large] Exudate Type: [1:Purulent] [2:Serous] [3:Serosanguineous] Exudate Color: [1:yellow, brown,  green] [2:amber] [3:red, brown] Foul Odor After [1:Yes] [2:No] [3:No] Cleansing: Odor Anticipated Due to No [2:N/A] [3:N/A] Product Use: Wound Margin: [1:Thickened] [2:Distinct, outline attached] [3:Distinct, outline attached] Granulation Amount: [1:Small (1-33%)] [2:Small (1-33%)] [3:Large (67-100%)] Granulation Quality: [1:Pink, Hyper-granulation] [2:Pink] [3:Red, Pink] Necrotic Amount: [1:Large (67-100%)] [2:Large (67-100%)] [3:None Present (0%)] Exposed Structures: [1:Fat Layer (Subcutaneous Tissue) Exposed: Yes Fascia: No Tendon: No Muscle: No] [2:Fat Layer (Subcutaneous Tissue) Exposed: Yes Fascia: No Tendon: No Muscle: No] [3:Fascia: No Fat Layer (Subcutaneous Tissue) Exposed: No Tendon: No Muscle: No] Joint: No Joint: No Joint: No Bone: No Bone: No Bone: No Limited to  Skin Breakdown Epithelialization: None None None Periwound Skin Texture: Scarring: Yes No Abnormalities Noted No Abnormalities Noted Excoriation: No Induration: No Callus: No Crepitus: No Rash: No Periwound Skin Maceration: Yes Maceration: Yes No Abnormalities Noted Moisture: Dry/Scaly: No Periwound Skin Color: Hemosiderin Staining: Yes No Abnormalities Noted No Abnormalities Noted Atrophie Blanche: No Cyanosis: No Ecchymosis: No Erythema: No Mottled: No Pallor: No Rubor: No Temperature: No Abnormality No Abnormality No Abnormality Tenderness on Yes Yes Yes Palpation: Wound Preparation: Ulcer Cleansing: Ulcer Cleansing: Ulcer Cleansing: Rinsed/Irrigated with Rinsed/Irrigated with Rinsed/Irrigated with Saline, Other: soap and Saline, Other: soap and Saline, Other: soap and water water water Topical Anesthetic Topical Anesthetic Topical Anesthetic Applied: Other: lidocaine Applied: Other: lidocaine Applied: Other 4% 4% Treatment Notes Wound #1 (Right, Lateral Malleolus) 1. Cleansed with: Clean wound with Normal Saline Cleanse wound with antibacterial soap and water 2. Anesthetic Topical Lidocaine 4% cream to wound bed prior to debridement 4. Dressing Applied: Iodoflex 5. Secondary Dressing Applied ABD Pad 7. Secured with Tape 3 Layer Compression System - Right Lower Extremity Notes drawtex CHASEN, MENDELL C. ([id number]) Wound #2 (Right, Medial Malleolus) 1. Cleansed with: Clean wound with Normal Saline Cleanse wound with antibacterial soap and water 2. Anesthetic Topical Lidocaine 4% cream to wound bed prior to debridement 4. Dressing Applied: Iodoflex 5. Secondary Dressing Applied ABD Pad 7. Secured with Tape 3 Layer Compression System - Right Lower Extremity Notes drawtex Wound #3 (Right, Posterior Lower Leg) 1. Cleansed with: Clean wound with Normal Saline 2. Anesthetic Topical Lidocaine 4% cream to wound bed prior to debridement 4. Dressing  Applied: Aquacel Ag 5. Secondary Dressing Applied ABD Pad 7. Secured with Tape 3 Layer Compression System - Right Lower Extremity Electronic Signature(s) Signed: 10/17/2016 9:58:44 AM By: [name] MD Entered By: [name] on 10/16/2016 08:40:35 [name] ([id number]) -------------------------------------------------------------------------------- Multi-Disciplinary Care Plan Details Patient Name: [name] Date of Service: 10/16/2016 8:00 AM Medical Record Number: [id number] Patient Account Number: [id number] Date of Birth/Sex: September 27, 1945 (71 y.o. Male) Treating RN: [name] Primary Care Lulu Hirschmann: [place], [place] Other Clinician: Referring Aldina Porta: [place], [place] Treating Rilley Stash/Extender: [name] Weeks in Treatment: 39 Active Inactive ` Venous Leg Ulcer Nursing Diagnoses: Knowledge deficit related to disease process and management Goals: Patient will maintain optimal edema control Date Initiated: 04/03/2016 Target Resolution Date: 11/24/2016 Goal Status: Active Interventions: Compression as ordered Treatment Activities: Therapeutic compression applied : 04/03/2016 Notes: ` Wound/Skin Impairment Nursing Diagnoses: Impaired tissue integrity Goals: Ulcer/skin breakdown will heal within 14 weeks Date Initiated: 01/17/2016 Target Resolution Date: 11/24/2016 Goal Status: Active Interventions: Assess ulceration(s) every visit Notes: Electronic Signature(s) Signed: 10/16/2016 4:45:40 PM By: Aurelio Jew, [name] ([id number]) Entered By: Alejandro Mulling on 10/16/2016 08:21:58 [name] ([id number]) -------------------------------------------------------------------------------- Pain Assessment Details Patient Name: [name] Date of Service: 10/16/2016 8:00 AM Medical Record Number: [id number] Patient Account Number: [id number] Date of  Birth/Sex: 1946/02/08 26(70 y.o. Male) Treating RN:  Phillis HaggisPinkerton, [name] Primary Care Circe Chilton: [place] Other Clinician: Referring Glenis Musolf: [place] Treating Danijela Vessey/Extender: Maxwell CaulOBSON, [name] G Weeks in Treatment: 39 Active Problems Location of Pain Severity and Description of Pain Patient Has Paino No Site Locations With Dressing Change: No Pain Management and Medication Current Pain Management: Electronic Signature(s) Signed: 10/16/2016 4:45:40 PM By: Alejandro MullingPinkerton, Debra Entered By: Alejandro MullingPinkerton, Debra on 10/16/2016 08:08:37 [name], [name] ([id number]) -------------------------------------------------------------------------------- Patient/Caregiver Education Details [name], Jaydyn Date of Service: 10/16/2016 8:00 AM Patient Name: C. Patient Account Number: [id number] Medical Record Treating RN: Phillis Haggisinkerton, [name] [id number] Number: Other Clinician: Date of Birth/Gender: 1946/02/08 (70 y.o. Male) Treating Baltazar NajjarOBSON, [name] Primary Care Physician: [place] Physician/Extender: G Referring Physician: [place], [place] Weeks in Treatment: 6839 Education Assessment Education Provided To: Patient Education Topics Provided Wound/Skin Impairment: Handouts: Other: do not get wrap wet Methods: Demonstration, Explain/Verbal Responses: State content correctly Electronic Signature(s) Signed: 10/16/2016 4:45:40 PM By: Alejandro MullingPinkerton, Debra Entered By: Alejandro MullingPinkerton, Debra on 10/16/2016 08:39:55 [name], [name] ([id number]) -------------------------------------------------------------------------------- Wound Assessment Details Patient Name: [name], [name] Date of Service: 10/16/2016 8:00 AM Medical Record Number: [id number] Patient Account Number: [id number] Date of Birth/Sex: 1946/02/08 31(70 y.o. Male) Treating RN: Phillis HaggisPinkerton, [name] Primary Care Denyse Fillion: [place] Other Clinician: Referring Etai Copado: [place], [place] Treating Del Wiseman/Extender: Maxwell CaulOBSON, [name] G Weeks in Treatment: 39 Wound Status Wound Number: 1 Primary  Venous Leg Ulcer Etiology: Wound Location: Right Malleolus - Lateral Wound Open Wounding Event: Gradually Appeared Status: Date Acquired: 08/11/2015 Comorbid Cataracts, Chronic Obstructive Weeks Of Treatment: 39 History: Pulmonary Disease (COPD), Clustered Wound: No Osteoarthritis Photos Photo Uploaded By: Alejandro MullingPinkerton, Debra on 10/16/2016 09:26:04 Wound Measurements Length: (cm) 7.9 Width: (cm) 7.4 Depth: (cm) 0.2 Area: (cm) 45.914 Volume: (cm) 9.183 % Reduction in Area: -165.7% % Reduction in Volume: -77.1% Epithelialization: None Tunneling: No Undermining: No Wound Description Full Thickness Without Exposed Foul Odor After Classification: Support Structures Due to Product Wound Margin: Thickened Slough/Fibrino Exudate Large Amount: Exudate Type: Purulent Exudate Color: yellow, brown, green Cleansing: Yes Use: No Yes Wound Bed Granulation Amount: Small (1-33%) Exposed Structure Granulation Quality: Pink, Hyper-granulation Fascia Exposed: No Vanderpool, Demetres C. ([id number]) Necrotic Amount: Large (67-100%) Fat Layer (Subcutaneous Tissue) Exposed: Yes Necrotic Quality: Adherent Slough Tendon Exposed: No Muscle Exposed: No Joint Exposed: No Bone Exposed: No Periwound Skin Texture Texture Color No Abnormalities Noted: No No Abnormalities Noted: No Callus: No Atrophie Blanche: No Crepitus: No Cyanosis: No Excoriation: No Ecchymosis: No Induration: No Erythema: No Rash: No Hemosiderin Staining: Yes Scarring: Yes Mottled: No Pallor: No Moisture Rubor: No No Abnormalities Noted: No Dry / Scaly: No Temperature / Pain Maceration: Yes Temperature: No Abnormality Tenderness on Palpation: Yes Wound Preparation Ulcer Cleansing: Rinsed/Irrigated with Saline, Other: soap and water, Topical Anesthetic Applied: Other: lidocaine 4%, Treatment Notes Wound #1 (Right, Lateral Malleolus) 1. Cleansed with: Clean wound with Normal Saline Cleanse wound with  antibacterial soap and water 2. Anesthetic Topical Lidocaine 4% cream to wound bed prior to debridement 4. Dressing Applied: Iodoflex 5. Secondary Dressing Applied ABD Pad 7. Secured with Tape 3 Layer Compression System - Right Lower Extremity Notes drawtex Electronic Signature(s) Signed: 10/16/2016 4:45:40 PM By: Alejandro MullingPinkerton, Debra Entered By: Alejandro MullingPinkerton, Debra on 10/16/2016 08:16:57 [name], [name] ([id number]) [name], [name] ([id number]) -------------------------------------------------------------------------------- Wound Assessment Details Patient Name: [name], [name] Date of Service: 10/16/2016 8:00 AM Medical Record Number: [id number] Patient Account Number: [id number] Date of Birth/Sex: 1946/02/08 40(70 y.o. Male) Treating RN: Phillis HaggisPinkerton, [name] Primary Care Shamecca Whitebread: [place] Other Clinician:  Referring Susanne Baumgarner: [place], [place] Treating Jamol Ginyard/Extender: Altamese [place] in Treatment: 39 Wound Status Wound Number: 2 Primary Venous Leg Ulcer Etiology: Wound Location: Right Malleolus - Medial Wound Open Wounding Event: Gradually Appeared Status: Date Acquired: 01/30/2016 Comorbid Cataracts, Chronic Obstructive Weeks Of Treatment: 37 History: Pulmonary Disease (COPD), Clustered Wound: Yes Osteoarthritis Photos Photo Uploaded By: Alejandro Mulling on 10/16/2016 09:26:25 Wound Measurements Length: (cm) 3.6 Width: (cm) 3.2 Depth: (cm) 0.2 Area: (cm) 9.048 Volume: (cm) 1.81 % Reduction in Area: 21.6% % Reduction in Volume: -56.7% Epithelialization: None Tunneling: No Undermining: No Wound Description Full Thickness Without Exposed Foul Odor After Classification: Support Structures Slough/Fibrino Wound Margin: Distinct, outline attached Exudate Large Amount: Exudate Type: Serous Exudate Color: amber Cleansing: No No Wound Bed Granulation Amount: Small (1-33%) Exposed Structure Granulation Quality: Pink Fascia Exposed:  No Racicot, Ardie C. ([id number]) Necrotic Amount: Large (67-100%) Fat Layer (Subcutaneous Tissue) Exposed: Yes Necrotic Quality: Adherent Slough Tendon Exposed: No Muscle Exposed: No Joint Exposed: No Bone Exposed: No Periwound Skin Texture Texture Color No Abnormalities Noted: No No Abnormalities Noted: No Moisture Temperature / Pain No Abnormalities Noted: No Temperature: No Abnormality Maceration: Yes Tenderness on Palpation: Yes Wound Preparation Ulcer Cleansing: Rinsed/Irrigated with Saline, Other: soap and water, Topical Anesthetic Applied: Other: lidocaine 4%, Treatment Notes Wound #2 (Right, Medial Malleolus) 1. Cleansed with: Clean wound with Normal Saline Cleanse wound with antibacterial soap and water 2. Anesthetic Topical Lidocaine 4% cream to wound bed prior to debridement 4. Dressing Applied: Iodoflex 5. Secondary Dressing Applied ABD Pad 7. Secured with Tape 3 Layer Compression System - Right Lower Extremity Notes drawtex Electronic Signature(s) Signed: 10/16/2016 4:45:40 PM By: Alejandro Mulling Entered By: Alejandro Mulling on 10/16/2016 08:15:40 [name] ([id number]) -------------------------------------------------------------------------------- Wound Assessment Details Patient Name: [name] Date of Service: 10/16/2016 8:00 AM Medical Record Number: [id number] Patient Account Number: [id number] Date of Birth/Sex: 15-Jun-1946 (71 y.o. Male) Treating RN: [name] Primary Care Evann Koelzer: [place] Other Clinician: Referring Bush Murdoch: [place], [place] Treating Halen Mossbarger/Extender: [name] Weeks in Treatment: 39 Wound Status Wound Number: 3 Primary Open Surgical Wound Etiology: Wound Location: Right Lower Leg - Posterior Wound Open Wounding Event: Surgical Injury Status: Date Acquired: 10/08/2016 Comorbid Cataracts, Chronic Obstructive Weeks Of Treatment: 1 History: Pulmonary Disease (COPD), Clustered  Wound: No Osteoarthritis Photos Photo Uploaded By: Alejandro Mulling on 10/16/2016 09:26:25 Wound Measurements Length: (cm) 0.5 Width: (cm) 0.5 Depth: (cm) 0.4 Area: (cm) 0.196 Volume: (cm) 0.079 % Reduction in Area: 0% % Reduction in Volume: 59.7% Epithelialization: None Tunneling: No Undermining: No Wound Description Classification: Partial Thickness Foul Odor Af Wound Margin: Distinct, outline attached Slough/Fibri Exudate Amount: Large Exudate Type: Serosanguineous Exudate Color: red, brown ter Cleansing: No no No Wound Bed Granulation Amount: Large (67-100%) Exposed Structure Granulation Quality: Red, Pink Fascia Exposed: No Necrotic Amount: None Present (0%) Fat Layer (Subcutaneous Tissue) Exposed: No Tendon Exposed: No Railey, Trase C. ([id number]) Muscle Exposed: No Joint Exposed: No Bone Exposed: No Limited to Skin Breakdown Periwound Skin Texture Texture Color No Abnormalities Noted: No No Abnormalities Noted: No Moisture Temperature / Pain No Abnormalities Noted: No Temperature: No Abnormality Tenderness on Palpation: Yes Wound Preparation Ulcer Cleansing: Rinsed/Irrigated with Saline, Other: soap and water, Topical Anesthetic Applied: Other Treatment Notes Wound #3 (Right, Posterior Lower Leg) 1. Cleansed with: Clean wound with Normal Saline 2. Anesthetic Topical Lidocaine 4% cream to wound bed prior to debridement 4. Dressing Applied: Aquacel Ag 5. Secondary Dressing Applied ABD Pad 7. Secured with Tape 3 Layer  Compression System - Right Lower Extremity Electronic Signature(s) Signed: 10/16/2016 4:45:40 PM By: Alejandro Mulling Entered By: Alejandro Mulling on 10/16/2016 08:18:04 [name] ([id number]) -------------------------------------------------------------------------------- Vitals Details Patient Name: [name] Date of Service: 10/16/2016 8:00 AM Medical Record Number: [id number] Patient Account Number:  [id number] Date of Birth/Sex: [birth year] (71 y.o. Male) Treating RN: [name] Primary Care Undrea Shipes: [place] Other Clinician: Referring Tasmin Exantus: [place], [place] Treating Brittlyn Cloe/Extender: [name] Weeks in Treatment: 39 Vital Signs Time Taken: 08:10 Temperature (F): 98.5 Height (in): 76 Pulse (bpm): 74 Weight (lbs): 238 Respiratory Rate (breaths/min): 18 Body Mass Index (BMI): 29 Blood Pressure (mmHg): 150/80 Reference Range: 80 - 120 mg / dl Electronic Signature(s) Signed: 10/16/2016 4:45:40 PM By: Alejandro Mulling Entered By: Alejandro Mulling on 10/16/2016 08:44:28

## 2016-10-19 DIAGNOSIS — I87331 Chronic venous hypertension (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: Secondary | ICD-10-CM | POA: Diagnosis not present

## 2016-10-20 NOTE — Progress Notes (Signed)
[name], Raynold C. ([id number]) Visit Report for 10/19/2016 Arrival Information Details Patient Name: [name], [name] Date of Service: 10/19/2016 8:00 AM Medical Record Number: [id number] Patient Account Number: [id number] Date of Birth/Sex: 02-12-1946 [age] y.o. Male) Treating RN: Clover MealyAfful, RN, BSN, [employer] Primary Care Cresencio Reesor: [place] Other Clinician: Referring Evann Koelzer: TEST, VICTOR Treating Brayant Dorr/Extender: Rudene ReBritto, Errol Weeks in Treatment: 39 Visit Information History Since Last Visit All ordered tests and consults were completed: No Patient Arrived: Ambulatory Added or deleted any medications: No Arrival Time: 08:01 Any new allergies or adverse reactions: No Accompanied By: self Had a fall or experienced change in No Transfer Assistance: None activities of daily living that may affect Patient Identification Verified: Yes risk of falls: Secondary Verification Process Yes Signs or symptoms of abuse/neglect since last No Completed: visito Patient Requires Transmission-Based No Hospitalized since last visit: No Precautions: Has Dressing in Place as Prescribed: Yes Patient Has Alerts: No Has Compression in Place as Prescribed: Yes Pain Present Now: No Electronic Signature(s) Signed: 10/19/2016 2:23:26 PM By: Elpidio EricAfful, Rita BSN, RN Entered By: Elpidio EricAfful, Rita on 10/19/2016 08:01:22 [name], Dariyon C. ([id number]) -------------------------------------------------------------------------------- Encounter Discharge Information Details Patient Name: [name], [name] Date of Service: 10/19/2016 8:00 AM Medical Record Number: [id number] Patient Account Number: [id number] Date of Birth/Sex: 02-12-1946 [age] y.o. Male) Treating RN: Clover MealyAfful, RN, BSN, [employer] Primary Care Dion Parrow: [place] Other Clinician: Referring Ananiah Maciolek: TEST, VICTOR Treating Kiya Eno/Extender: Rudene ReBritto, Errol Weeks in Treatment: 6639 Encounter Discharge Information Items Discharge Pain Level: 0 Discharge  Condition: Stable Ambulatory Status: Ambulatory Discharge Destination: Home Private Transportation: Auto Accompanied By: self Schedule Follow-up Appointment: No Medication Reconciliation completed and No provided to Patient/Care Margretta Zamorano: Clinical Summary of Care: Electronic Signature(s) Signed: 10/19/2016 9:11:29 AM By: Elpidio EricAfful, Rita BSN, RN Entered By: Elpidio EricAfful, Rita on 10/19/2016 09:11:28 [name], Win C. ([id number]) -------------------------------------------------------------------------------- Patient/Caregiver Education Details Patient Name: [name], [name] Date of Service: 10/19/2016 8:00 AM Medical Record Number: [id number] Patient Account Number: [id number] Date of Birth/Gender: 02-12-1946 [age] y.o. Male) Treating RN: Clover MealyAfful, RN, BSN, [place] [employer] Primary Care Physician: [place] Other Clinician: Referring Physician: TEST, VICTOR Treating Physician/Extender: Rudene ReBritto, Errol Weeks in Treatment: 6339 Education Assessment Education Provided To: Patient Education Topics Provided Basic Hygiene: Methods: Explain/Verbal Responses: State content correctly Electronic Signature(s) Signed: 10/19/2016 2:23:26 PM By: Elpidio EricAfful, Rita BSN, RN Entered By: Elpidio EricAfful, Rita on 10/19/2016 09:11:09

## 2016-10-23 ENCOUNTER — Encounter: Payer: Medicare HMO | Admitting: Internal Medicine

## 2016-10-23 DIAGNOSIS — I87331 Chronic venous hypertension (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: Secondary | ICD-10-CM | POA: Diagnosis not present

## 2016-10-25 NOTE — Progress Notes (Signed)
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 the last day or 2. There are four new tiny wounds in this area. His co- pay for TheraSkin application is really high I think beyond her means 02/07/16; patient is improved C+S cultures MSSA completed Doxy. using iodoflex 02/15/16; patient arrived today with the wound and roughly the same condition. Extensive area on the right lateral foot and ankle. Using Iodoflex. He came in last week with a cluster of new wounds on the medial aspect of the same ankle. 02/22/16; once again the patient complains of a lot of drainage coming out of this wound. We brought him back in on Friday for a dressing change has been using Iodoflex. States his pain level is better 02/29/16; still complaining of a lot of drainage even though we are putting absorbent material over the Santyl and bringing him back on Fridays for dressing changes. He is not complaining of pain. Her intake nurse notes blistering 03/07/16: pt returns today for f/u. he admits out in rain on Saturday and soaked his right leg. he did not share with his wife and he didn't notify the [facility]. he has an odor today that is c/w pseudomonas. Wound  has greenish tan slough. there is no periwound erythema, induration, or fluctuance. wound has deteriorated since previous visit. denies fever, chills, body aches or malaise. no increased pain. 03/13/16: C+S showed proteus. He has not received AB'S. Switched to RTD last week. 03/27/16 patient is been using Iodoflex. Wound bed has improved and debridement is certainly easier 04/10/2016 -- he has been scheduled for a venous duplex study towards the end of the month 04/17/16; has been using silver alginate, states that the Iodoflex was hurting his wound and since that is been changed he has had no pain unfortunately the surface of the wound continues to be unhealthy with Date, Colleen C. ([id number]) thick gelatinous slough and nonviable tissue. The wound will not heal like this. 04/20/2016 -- the patient was here for a nurse visit but I was asked to see the patient as the slough was quite significant and the nurse needed for clarification regarding the ointment to be used. 04/24/16; the patient's wounds on the right medial and right lateral ankle/malleolus both look a lot better today. Less adherent slough healthier tissue. Dimensions better especially medially 05/01/16; the patient's wound surface continues to improve however he continues to require debridement switch her easier each week. Continue Santyl/Metahydrin mixture Hydrofera Blue next week. Still drainage on the medial aspect according to the intake nurse 05/08/16; still using Santyl and Medihoney. Still a lot of drainage per her intake  nurse. Patient has no complaints pain fever chills etc. 05/15/16 switched the Hydrofera Blue last week. Dimensions down especially in the medial right leg wound. Area on the lateral which is more substantial also looks better still requires debridement 05/22/16; we have been using Hydrofera Blue. Dimensions of the wound are improved especially medially although this continues to be a long arduous  process 05/29/16 Patient is seen in follow-up today concerning the bimalleolar wounds to his right lower extremity. Currently he tells me that the pain is doing very well about a 1 out of 10 today. Yesterday was a little bit worse but he tells me that he was more active watering his flowers that day. Overall he feels that his symptoms are doing significantly better at this point in time. His edema continues to be controlled well with the 4-layer compression wrap and he really has not noted any odor at this point in time. He is tolerating the dressing changes when they are performed well. 06/05/16 at this point in time today patient currently shows no interval signs or symptoms of local or systemic infection. Again his pain level he rates to be a 1 out of 10 at most and overall he tells me that generally this is not giving him much trouble. In fact he even feels maybe a little bit better than last week. We have continue with the 4-layer compression wrap in which she tolerates very well at this point. He is continuing to utilize the [place]. 06/12/16 I think there has been some progression in the status of both of these wounds over today again covered in a gelatinous surface. Has been using Hydrofera Blue. We had used Iodoflex in the past I'm not sure if there was an issue other than changing to something that might progress towards closure faster 06/19/16; he did not tolerate the Flexeril last week secondary to pain and this was changed on Friday back to [facility] area he continues to have copious amounts of gelatinous surface slough which is think inhibiting the speed of healing this area 06/26/16 patient over the last week has utilized the Santyl to try to loosen up some of the tightly adherent slough that was noted on evaluation last week. The good news is he tells me that the medial malleoli region really does not bother him the right llateral malleoli region is more tender  to palpation at this point in time especially in the central/inferior location. However it does appear that the Santyl has done his job to loosen up the adherent slough at this point in time. Fortunately he has no interval signs or symptoms of infection locally or systemically no purulent discharge noted. 07/03/16 at this point in time today patient's wounds appear to be significantly improved over the right medial and lateral malleolus locations. He has much less tenderness at this point in time and the wounds appear clean her although there is still adherent slough this is sufficiently improved over what I saw last week. I still see no evidence of local infection. 07/10/16; continued gradual improvement in the right medial and lateral malleolus locations. The lateral is more substantial wound now divided into 2 by a rim of normal epithelialization. 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 He denies any complaints of pain or intolerance to compression. Both ulcers are smaller in size oriented today's measurements, both are covered with a softly adherent  slough. 07/24/16; medial wound is smaller, lateral about the same although surface looks better. Still using [name], [name] ([id number]) Hydrofera Blue 07/31/16; arrives today complaining of pain in the lateral part of his foot. Nurse reports a lot more drainage. He has been using Hydrofera Blue. Switch to silver alginate today 08/03/2016 -- I was asked to see the patient was here for a nurse visit today. I understand he had a lot of pain in his right lower extremity and was having blisters on his right foot which have not been there before. Though he started on doxycycline he does not have blisters elsewhere on his body. I do not believe this is a drug allergy. also mentioned that there was a copious purulent discharge  from the wound and clinically there is no evidence of cellulitis. 08/07/16; I note that the patient came in for his nurse check on Friday apparently with blisters on his toes on the right than a lot of swelling in his forefoot. He continued on the doxycycline that I had prescribed on 12/8. A culture was done of the lateral wound that showed a combination of a few Proteus and Pseudomonas. Doxycycline might of covered the Proteus but would be unlikely to cover the Pseudomonas. He is on Coumadin. He arrives in the clinic today feeling a lot better states the pain is a lot better but nothing specific really was done other than to rewrap the foot also noted that he had arterial studies ordered in August although these were never done. It is reasonable to go ahead and reorder these. 08/14/16; generally arrives in a better state today in terms of the wounds he has taken cefdinir for one week. Our intake nurse reports copious amounts of drainage but the patient is complaining of much less pain. He is not had his PT and INR checked and I've asked him to do this today or tomorrow. 08/24/2016 -- patient arrives today after 10 days and said he had a stomach upset. His arterial study was done and I have reviewed this report and find it to be within normal limits. However I did not note any venous duplex studies for reflux, and Dr. Leanord Hawking may have ordered these in the past but I will leave it to him to decide if he needs these. The patient has finished his course of cefdinir. 08/28/16; patient arrives today again with copious amounts of thick really green drainage for our intake nurse. He states he has a very tender spot at the superior part of the lateral wound. Wounds are larger 09/04/16; no real change in the condition of this patient's wound still copious amounts of surface slough. Started him on Iodoflex last week he is completing another course of Cefdinir or which I think was done empirically. His  arterial study showed ABIs were 1.1 on the right 1.5 on the left. He did have a slightly reduced ABI in the right the left one was not obtained. Had calcification of the right posterior tibial artery. The interpretation was no segmental stenosis. His waveforms were triphasic. His reflux studies are later this month. Depending on this I'll send him for a vascular consultation, he may need to see plastic surgery as I believe he is had plastic surgery on this foot in the past. He had an injury to the foot in the 1980s. 1/16 /18 right lateral greater than right medial ankle wounds on the right in the setting of previous skin grafting. 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 He does not have arterial issues. Each week he comes in with the same adherent surface slough although there was less of this today 09/18/16; right lateral greater than right medial lower extremity wounds in the setting of previous skin grafting and trauma. He has least to vein laser ablation scheduled for February 2 for venous reflux. He does not have significant arterial disease. Problem has been very difficult to handle surface slough/necrotic tissue. Recently using Iodoflex for this with some, albeit slow improvement 09/25/16; right lateral greater than right medial lower extremity venous wounds in the setting of previous skin grafting. He is going for ablation surgery on February 2 after this he'll come back here for rewrap. He has been using Iodoflex as the primary dressing. 10/02/16; right lateral greater than right medial lower extremity wounds in the setting of previous skin grafting. He had his ablation surgery last week, I don't have a report. He tolerated this well. Came in with a thigh- high Unna boots on Friday. We have been using Iodoflex as the primary dressing. His measurements are improving 10/09/16; continues to make nice aggressive  in terms of the wounds on his lateral and medial right ankle in the setting of previous skin grafting. Yesterday he noticed drainage at one of his surgical sites from his [name], [name]. ([id number]) venous ablation on the right calf. He took off the bandage over this area felt a "popping" sensation and a reddish-brown drainage. He is not complaining of any pain 10/16/16; he continues to make nice progression in terms of the wounds on the lateral and medial malleolus. Both smaller using Iodoflex. He had a surgical area in his posterior mid calf we have been using iodoform. All the wounds are down and dimensions 10/23/16; the patient arrives today with no complaints. He states the Iodoflex is a bit uncomfortable. He is not systemically unwell. We have been using Iodoflex to the lateral right ankle and the medial and Aquacel Ag to the reflux surgical wound on the posterior right calf. All of these wounds are doing well Objective Constitutional Patient is hypertensive.. Pulse regular and within target range for patient.Marland Kitchen Respirations regular, non-labored and within target range.. Temperature is normal and within the target range for the patient.. Patient's appearance is neat and clean. Appears in no acute distress. Well nourished and well developed.. Vitals Time Taken: 8:09 AM, Height: 76 in, Weight: 238 lbs, BMI: 29, Temperature: 98.3 F, Pulse: 84 bpm, Respiratory Rate: 18 breaths/min, Blood Pressure: 150/76 mmHg. General Notes: Wound exam; his original wound on the right lateral ankle as necrotic material over roughly 40% of the wound and circumferentially. This was removed with a #3 curet. He tolerates this well. Granulation looks healthy. No debridement was necessary to the medial wound. Both wounds are slightly smaller. Surgical wound on the right posterior calf also looks healthier appears to be progressing towards closure Integumentary (Hair, Skin) Wound #1 status is Open. Original  cause of wound was Gradually Appeared. The wound is located on the Right,Lateral Malleolus. The wound measures 7cm length x 8cm width x 0.2cm depth; 43.982cm^2 area and 8.796cm^3 volume. There is Fat Layer (Subcutaneous Tissue) Exposed exposed. There is no tunneling or undermining noted. There is a large amount of purulent drainage noted. The wound margin is thickened. There is medium (34-66%) pink granulation within the wound bed. There is a medium (34-66%) amount of necrotic tissue within the  wound bed including Adherent Slough. The periwound skin appearance exhibited: Scarring, Maceration, Hemosiderin Staining. The periwound skin appearance did not exhibit: Callus, Crepitus, Excoriation, Induration, Rash, Dry/Scaly, Atrophie Blanche, Cyanosis, Ecchymosis, Mottled, Pallor, Rubor, Erythema. Periwound temperature was noted as No Abnormality. The periwound has tenderness on palpation. Wound #2 status is Open. Original cause of wound was Gradually Appeared. The wound is located on the Right,Medial Malleolus. The wound measures 3.5cm length x 3.2cm width x 0.2cm depth; 8.796cm^2 area and 1.759cm^3 volume. There is Fat Layer (Subcutaneous Tissue) Exposed exposed. There is no tunneling or undermining noted. There is a large amount of serous drainage noted. The wound margin is distinct with the outline attached to the wound base. There is medium (34-66%) pink granulation within the wound bed. There is a medium (34-66%) amount of necrotic tissue within the wound bed including Adherent Slough. [name], [name] ([id number]) The periwound skin appearance exhibited: Maceration. Periwound temperature was noted as No Abnormality. The periwound has tenderness on palpation. Wound #3 status is Open. Original cause of wound was Surgical Injury. The wound is located on the Right,Posterior Lower Leg. The wound measures 0.3cm length x 0.3cm width x 0.2cm depth; 0.071cm^2 area and 0.014cm^3 volume. The wound is  limited to skin breakdown. There is no tunneling or undermining noted. There is a large amount of serous drainage noted. The wound margin is distinct with the outline attached to the wound base. There is medium (34-66%) red, pink granulation within the wound bed. There is a medium (34-66%) amount of necrotic tissue within the wound bed including Adherent Slough. Periwound temperature was noted as No Abnormality. The periwound has tenderness on palpation. 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 There was a Skin/Subcutaneous Tissue Debridement ([id number]) debridement with total area of 56 sq cm performed by [name], MD. with the following instrument(s): Curette to remove Viable and Non-Viable tissue/material including Fibrin/Slough and Subcutaneous after achieving pain control using Lidocaine 4% Topical Solution. A time out was conducted at 08:29, prior to the start of the procedure. A Minimum amount of bleeding was controlled with Pressure. The procedure was tolerated well with a pain level of 0 throughout and a pain level of 0 following the procedure. Post Debridement Measurements: 7cm length x 8cm width x 0.2cm depth; 8.796cm^3 volume. Character of Wound/Ulcer Post Debridement requires further debridement. Severity of Tissue Post Debridement is: Fat layer  exposed. Post procedure Diagnosis Wound #1: Same as Pre-Procedure [name], [name]. ([id number]) Plan Wound Cleansing: Wound #1 Right,Lateral Malleolus: Cleanse wound with mild soap and water May shower with protection. No tub bath. Wound #2 Right,Medial Malleolus: Cleanse wound with mild soap and water May shower with protection. No tub bath. Wound #3 Right,Posterior Lower Leg: Cleanse wound with mild soap and water May shower with protection. No tub bath. Anesthetic: Wound #1 Right,Lateral Malleolus: Topical Lidocaine 4% cream applied to wound bed prior to debridement Wound #2 Right,Medial Malleolus: Topical Lidocaine 4% cream applied to wound bed prior  to debridement Wound #3 Right,Posterior Lower Leg: Topical Lidocaine 4% cream applied to wound bed prior to debridement Skin Barriers/Peri-Wound Care: Wound #1 Right,Lateral Malleolus: Barrier cream - zinc oxide Triamcinolone Acetonide Ointment Primary Wound Dressing: Wound #1 Right,Lateral Malleolus: Hydrafera Blue Wound #2 Right,Medial Malleolus: Hydrafera Blue Wound #3 Right,Posterior Lower Leg: Aquacel Ag Secondary Dressing: Wound #1 Right,Lateral Malleolus: ABD pad Drawtex Wound #2 Right,Medial Malleolus: ABD pad Drawtex Wound #3 Right,Posterior Lower Leg: ABD pad Dressing Change Frequency: Wound #1 Right,Lateral Malleolus: Change dressing every week Other: - nurse visit Friday Wound #2 Right,Medial MalleolusCEVIN, RUBINSTEIN. ([id number]) Change dressing every week Other: - nurse visit Friday Wound #3 Right,Posterior Lower Leg: Change dressing every day. Other: - nurse visit Friday Follow-up Appointments: Wound #1 Right,Lateral Malleolus: Return Appointment in 1 week. Other: - nurse visit Friday Wound #2 Right,Medial Malleolus: Return Appointment in 1 week. Other: - nurse visit Friday Wound #3 Right,Posterior Lower Leg: Return Appointment in 1 week. Other: - nurse visit Friday Edema  Control: Wound #1 Right,Lateral Malleolus: 3 Layer Compression System - Right Lower Extremity - UNNA TO ANCHOR Elevate legs to the level of the heart and pump ankles as often as possible Wound #2 Right,Medial Malleolus: 3 Layer Compression System - Right Lower Extremity - UNNA TO ANCHOR Elevate legs to the level of the heart and pump ankles as often as possible Wound #3 Right,Posterior Lower Leg: 3 Layer Compression System - Right Lower Extremity - UNNA TO ANCHOR Elevate legs to the level of the heart and pump ankles as often as possible Additional Orders / Instructions: Wound #1 Right,Lateral Malleolus: Increase protein intake. OK to return to work with the following restrictions: Activity as tolerated Wound #2 Right,Medial Malleolus: Increase protein intake. OK to return to work with the following restrictions: Activity as tolerated Wound #3 Right,Posterior Lower Leg: Increase protein intake. OK to return to work with the following restrictions: Activity as tolerated I've changed primary dressings to the ankle wounds to hydrofera Blue.Hopefully to speed epitherlizlization. I don't believe he had the resourses for an advanced rx option TYRICK, DUNAGAN ([id number]) Electronic Signature(s) Signed: 10/24/2016 3:56:26 PM By: [name] MD Entered By: [name] on 10/23/2016 08:37:40 [name] ([id number]) -------------------------------------------------------------------------------- SuperBill Details [name] Date of Service: 10/23/2016 Patient Name: C. Patient Account Number: [id number] Medical Record Treating RN: [name] [id number] Number: Other Clinician: Date of Birth/Sex: 1946/07/22 (70 y.o. 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, initial T85.613A encounter Disruption of external operation (surgical) wound, not elsewhere classified, subsequent T81.31XD encounter L97.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 of skin Facility Procedures CPT4: Description Modifier Quantity Code [id number] 11042 - DEB SUBQ TISSUE 20 SQ CM/< 1 ICD-10 Description Diagnosis L97.212 Non-pressure chronic ulcer of right calf with fat layer exposed I87.331 Chronic venous hypertension (idiopathic) with ulcer and  inflammation of right lower extremity Physician Procedures CPT4: Description Modifier Quantity Code [id number] 11042 - WC PHYS SUBQ TISS 20 SQ CM 1 ICD-10 Description Diagnosis L97.212 Non-pressure chronic ulcer of right calf with fat layer exposed I87.331 Chronic venous hypertension (idiopathic) with ulcer and  inflammation of  right lower extremity GALE, HULSE ([id number]) Electronic Signature(s) Signed: 10/24/2016 3:56:26 PM By: [name] MD Entered By: [name] on 10/23/2016 08:38:19

## 2016-10-25 NOTE — Progress Notes (Signed)
[name], [name] ([id number]) Visit Report for 10/23/2016 Arrival Information Details Patient Name: [name], [name]. Date of Service: 10/23/2016 8:00 AM Medical Record Number: [id number] Patient Account Number: [id number] Date of Birth/Sex: Jan 16, 1946 (71 y.o. Male) Treating RN: [name] Primary Care Loistine Eberlin: [facility] [place] Other Clinician: Referring Noele Icenhour: [facility] [place] Treating Darnel Mchan/Extender: [place] in Treatment: 40 Visit Information History Since Last Visit All ordered tests and consults were completed: No Patient Arrived: Ambulatory Added or deleted any medications: No Arrival Time: 08:07 Any new allergies or adverse reactions: No Accompanied By: self Had a fall or experienced change in No Transfer Assistance: None activities of daily living that may affect Patient Identification Verified: Yes risk of falls: Secondary Verification Process Yes Signs or symptoms of abuse/neglect since last No Completed: visito Patient Requires Transmission-Based No Hospitalized since last visit: No Precautions: Has Dressing in Place as Prescribed: Yes Patient Has Alerts: No Has Compression in Place as Prescribed: Yes Pain Present Now: No Electronic Signature(s) Signed: 10/23/2016 4:34:40 PM By: Alejandro Mulling Entered By: Alejandro Mulling on 10/23/2016 08:09:05 [name] ([id number]) -------------------------------------------------------------------------------- Encounter Discharge Information Details Patient Name: [name] Date of Service: 10/23/2016 8:00 AM Medical Record Number: [id number] Patient Account Number: [id number] Date of Birth/Sex: 1945/12/02 (71 y.o. Male) Treating RN: [name] Primary Care Natalyia Innes: [place] [place] Other Clinician: Referring Perris Tripathi: [facility] [place] Treating Tyrelle Raczka/Extender: [name] Weeks in Treatment: 40 Encounter Discharge Information Items Discharge Pain Level: 0 Discharge  Condition: Stable Ambulatory Status: Ambulatory Discharge Destination: Home Transportation: Private Auto Accompanied By: self Schedule Follow-up Appointment: Yes Medication Reconciliation completed No and provided to Patient/Care Joseline Mccampbell: Patient Clinical Summary of Care: Declined Electronic Signature(s) Signed: 10/23/2016 9:10:45 AM By: Gwenlyn Perking Entered By: Gwenlyn Perking on 10/23/2016 09:10:45 [name] ([id number]) -------------------------------------------------------------------------------- Lower Extremity Assessment Details Patient Name: [name] Date of Service: 10/23/2016 8:00 AM Medical Record Number: [id number] Patient Account Number: [id number] Date of Birth/Sex: [birth year] (71 y.o. Male) Treating RN: [name] Primary Care Ayvah Caroll: [facility] [place] Other Clinician: Referring Darcell Yacoub: [facility] [place] Treating Claryce Friel/Extender: [name] Weeks in Treatment: 40 Edema Assessment Assessed: [Left: No] [Right: No] E[Left: dema] [Right: :] Calf Left: Right: Point of Measurement: 40 cm From Medial Instep cm 34.5 cm Ankle Left: Right: Point of Measurement: 12 cm From Medial Instep cm 22.2 cm Vascular Assessment Pulses: Dorsalis Pedis Palpable: [Right:Yes] Posterior Tibial Extremity colors, hair growth, and conditions: Extremity Color: [Right:Normal] Temperature of Extremity: [Right:Warm] Capillary Refill: [Right:< 3 seconds] Electronic Signature(s) Signed: 10/23/2016 4:34:40 PM By: Alejandro Mulling Entered By: Alejandro Mulling on 10/23/2016 08:25:56 [name] ([id number]) -------------------------------------------------------------------------------- Multi Wound Chart Details Patient Name: [name] Date of Service: 10/23/2016 8:00 AM Medical Record Number: [id number] Patient Account Number: [id number] Date of Birth/Sex: 08-14-1946 (71 y.o. Male) Treating RN: [name] Primary Care Saragrace Selke:  [facility] [place] Other Clinician: Referring Kaylee Wombles: [facility] [place] Treating Jeree Delcid/Extender: [name] Weeks in Treatment: 40 Vital Signs Height(in): 76 Pulse(bpm): 84 Weight(lbs): 238 Blood Pressure 150/76 (mmHg): Body Mass Index(BMI): 29 Temperature(F): 98.3 Respiratory Rate 18 (breaths/min): Photos: [1:No Photos] [2:No Photos] [3:No Photos] Wound Location: [1:Right Malleolus - Lateral] [2:Right Malleolus - Medial] [3:Right Lower Leg - Posterior] Wounding Event: [1:Gradually Appeared] [2:Gradually Appeared] [3:Surgical Injury] Primary Etiology: [1:Venous Leg Ulcer] [2:Venous Leg Ulcer] [3:Open Surgical Wound] Comorbid History: [1:Cataracts, Chronic Obstructive Pulmonary Disease (COPD), Osteoarthritis] [2:Cataracts, Chronic Obstructive Pulmonary Disease (COPD), Osteoarthritis] [3:Cataracts, Chronic Obstructive Pulmonary Disease (COPD), Osteoarthritis] Date Acquired: [1:08/11/2015] [2:01/30/2016] [3:10/08/2016] Weeks of Treatment: [1:40] [2:38] [3:2]  Wound Status: [1:Open] [2:Open] [3:Open] Clustered Wound: [1:No] [2:Yes] [3:No] Measurements L x W x D 7x8x0.2 [2:3.5x3.2x0.2] [3:0.3x0.3x0.2] (cm) Area (cm) : [1:43.982] [2:8.796] [3:0.071] Volume (cm) : [1:8.796] [2:1.759] [3:0.014] % Reduction in Area: [1:-154.50%] [2:23.80%] [3:63.80%] % Reduction in Volume: -69.70% [2:-52.30%] [3:92.90%] Classification: [1:Full Thickness Without Exposed Support Structures] [2:Full Thickness Without Exposed Support Structures] [3:Partial Thickness] Exudate Amount: [1:Large] [2:Large] [3:Large] Exudate Type: [1:Purulent] [2:Serous] [3:Serous] Exudate Color: [1:yellow, brown, green] [2:amber] [3:amber] Foul Odor After [1:Yes] [2:No] [3:No] Cleansing: Odor Anticipated Due to No [2:N/A] [3:N/A] Product Use: Wound Margin: [1:Thickened] [2:Distinct, outline attached Distinct, outline attached] Granulation Amount: Medium (34-66%) Medium (34-66%) Medium (34-66%) Granulation Quality: Pink  Pink Red, Pink Necrotic Amount: Medium (34-66%) Medium (34-66%) Medium (34-66%) Exposed Structures: Fat Layer (Subcutaneous Fat Layer (Subcutaneous Fascia: No Tissue) Exposed: Yes Tissue) Exposed: Yes Fat Layer (Subcutaneous Fascia: No Fascia: No Tissue) Exposed: No Tendon: No Tendon: No Tendon: No Muscle: No Muscle: No Muscle: No Joint: No Joint: No Joint: No Bone: No Bone: No Bone: No Limited to Skin Breakdown Epithelialization: None None None Debridement: Debridement ([id number]- N/A N/A 11047) Pre-procedure 08:29 N/A N/A Verification/Time Out Taken: Pain Control: Lidocaine 4% Topical N/A N/A Solution Tissue Debrided: Fibrin/Slough, N/A N/A Subcutaneous Level: Skin/Subcutaneous N/A N/A Tissue Debridement Area (sq 56 N/A N/A cm): Instrument: Curette N/A N/A Bleeding: Minimum N/A N/A Hemostasis Achieved: Pressure N/A N/A Procedural Pain: 0 N/A N/A Post Procedural Pain: 0 N/A N/A Debridement Treatment Procedure was tolerated N/A N/A Response: well Post Debridement 7x8x0.2 N/A N/A Measurements L x W x D (cm) Post Debridement 8.796 N/A N/A Volume: (cm) Periwound Skin Texture: Scarring: Yes No Abnormalities Noted No Abnormalities Noted Excoriation: No Induration: No Callus: No Crepitus: No Rash: No Periwound Skin Maceration: Yes Maceration: Yes No Abnormalities Noted Moisture: Dry/Scaly: No Periwound Skin Color: Hemosiderin Staining: Yes No Abnormalities Noted No Abnormalities Noted Atrophie Blanche: No Cyanosis: No Ecchymosis: No Traeger, Rochelle C. ([id number]) Erythema: No Mottled: No Pallor: No Rubor: No Temperature: No Abnormality No Abnormality No Abnormality Tenderness on Yes Yes Yes Palpation: Wound Preparation: Ulcer Cleansing: Ulcer Cleansing: Ulcer Cleansing: Rinsed/Irrigated with Rinsed/Irrigated with Rinsed/Irrigated with Saline, Other: soap and Saline, Other: soap and Saline, Other: soap and water water water Topical Anesthetic  Topical Anesthetic Topical Anesthetic Applied: Other: lidocaine Applied: Other: lidocaine Applied: Other 4% 4% Procedures Performed: Debridement N/A N/A Treatment Notes Electronic Signature(s) Signed: 10/24/2016 3:56:26 PM By: [name] MD Entered By: [name] on 10/23/2016 08:33:19 [name] ([id number]) -------------------------------------------------------------------------------- Multi-Disciplinary Care Plan Details Patient Name: [name] Date of Service: 10/23/2016 8:00 AM Medical Record Number: [id number] Patient Account Number: [id number] Date of Birth/Sex: Jun 06, 1946 (71 y.o. Male) Treating RN: [name] Primary Care Natasja Niday: [facility], [place] Other Clinician: Referring Levester Waldridge: [facility] [place] Treating Rielyn Krupinski/Extender: [name] Weeks in Treatment: 40 Active Inactive ` Venous Leg Ulcer Nursing Diagnoses: Knowledge deficit related to disease process and management Goals: Patient will maintain optimal edema control Date Initiated: 04/03/2016 Target Resolution Date: 11/24/2016 Goal Status: Active Interventions: Compression as ordered Treatment Activities: Therapeutic compression applied : 04/03/2016 Notes: ` Wound/Skin Impairment Nursing Diagnoses: Impaired tissue integrity Goals: Ulcer/skin breakdown will heal within 14 weeks Date Initiated: 01/17/2016 Target Resolution Date: 11/24/2016 Goal Status: Active Interventions: Assess ulceration(s) every visit Notes: Electronic Signature(s) Signed: 10/23/2016 4:34:40 PM By: Aurelio Jew, [name] ([id number]) Entered By: Alejandro Mulling on 10/23/2016 08:26:08 [name] ([id number]) -------------------------------------------------------------------------------- Pain Assessment Details Patient Name: [name] Date of Service: 10/23/2016 8:00 AM Medical Record Number:  [id number] Patient Account Number: [id number] Date of Birth/Sex: 1946/01/10  (71 y.o. Male) Treating RN: [name] Primary Care Laykin Rainone: [facility] [place] Other Clinician: Referring Elisah Parmer: [facility] [place] Treating Terion Hedman/Extender: [name] Weeks in Treatment: 40 Active Problems Location of Pain Severity and Description of Pain Patient Has Paino No Site Locations With Dressing Change: No Pain Management and Medication Current Pain Management: Electronic Signature(s) Signed: 10/23/2016 4:34:40 PM By: Alejandro Mulling Entered By: Alejandro Mulling on 10/23/2016 08:09:11 [name] ([id number]) -------------------------------------------------------------------------------- Patient/Caregiver Education Details [name] Date of Service: 10/23/2016 8:00 AM Patient Name: C. Patient Account Number: [id number] Medical Record Treating RN: [name] [id number] Number: Other Clinician: Date of Birth/Gender: 01/26/1946 (70 y.o. Male) Treating [name] Primary Care Physician: [place], [place] Physician/Extender: G Referring Physician: [facility] [place] Weeks in Treatment: 18 Education Assessment Education Provided To: Patient Education Topics Provided Wound/Skin Impairment: Handouts: Other: do not get wrap wet Methods: Demonstration, Explain/Verbal Responses: State content correctly Electronic Signature(s) Signed: 10/23/2016 4:34:40 PM By: Alejandro Mulling Entered By: Alejandro Mulling on 10/23/2016 08:28:09 [name] ([id number]) -------------------------------------------------------------------------------- Wound Assessment Details Patient Name: [name] Date of Service: 10/23/2016 8:00 AM Medical Record Number: [id number] Patient Account Number: [id number] Date of Birth/Sex: 1945-12-03 (71 y.o. Male) Treating RN: [name] Primary Care Treysean Petruzzi: [facility] [place] Other Clinician: Referring Birdella Sippel: [facility] [place] Treating Saintclair Schroader/Extender: [name] Weeks in Treatment: 40 Wound  Status Wound Number: 1 Primary Venous Leg Ulcer Etiology: Wound Location: Right Malleolus - Lateral Wound Open Wounding Event: Gradually Appeared Status: Date Acquired: 08/11/2015 Comorbid Cataracts, Chronic Obstructive Weeks Of Treatment: 40 History: Pulmonary Disease (COPD), Clustered Wound: No Osteoarthritis Photos Photo Uploaded By: Alejandro Mulling on 10/23/2016 11:25:27 Wound Measurements Length: (cm) 7 Width: (cm) 8 Depth: (cm) 0.2 Area: (cm) 43.982 Volume: (cm) 8.796 % Reduction in Area: -154.5% % Reduction in Volume: -69.7% Epithelialization: None Tunneling: No Undermining: No Wound Description Full Thickness Without Exposed Foul Odor After Classification: Support Structures Due to Product Wound Margin: Thickened Slough/Fibrino Exudate Large Amount: Exudate Type: Purulent Exudate Color: yellow, brown, green Cleansing: Yes Use: No Yes Wound Bed Granulation Amount: Medium (34-66%) Exposed Structure Granulation Quality: Pink Fascia Exposed: No Gruenhagen, Marcell C. ([id number]) Necrotic Amount: Medium (34-66%) Fat Layer (Subcutaneous Tissue) Exposed: Yes Necrotic Quality: Adherent Slough Tendon Exposed: No Muscle Exposed: No Joint Exposed: No Bone Exposed: No Periwound Skin Texture Texture Color No Abnormalities Noted: No No Abnormalities Noted: No Callus: No Atrophie Blanche: No Crepitus: No Cyanosis: No Excoriation: No Ecchymosis: No Induration: No Erythema: No Rash: No Hemosiderin Staining: Yes Scarring: Yes Mottled: No Pallor: No Moisture Rubor: No No Abnormalities Noted: No Dry / Scaly: No Temperature / Pain Maceration: Yes Temperature: No Abnormality Tenderness on Palpation: Yes Wound Preparation Ulcer Cleansing: Rinsed/Irrigated with Saline, Other: soap and water, Topical Anesthetic Applied: Other: lidocaine 4%, Treatment Notes Wound #1 (Right, Lateral Malleolus) 1. Cleansed with: Clean wound with Normal Saline Cleanse  wound with antibacterial soap and water 2. Anesthetic Topical Lidocaine 4% cream to wound bed prior to debridement 4. Dressing Applied: Hydrafera Blue 5. Secondary Dressing Applied ABD Pad 7. Secured with Tape 3 Layer Compression System - Right Lower Extremity Notes unna to anchor, drawtex Electronic Signature(s) Signed: 10/23/2016 4:34:40 PM By: Alejandro Mulling Entered By: Alejandro Mulling on 10/23/2016 08:22:08 [name] ([id number]) [name], [name] ([id number]) -------------------------------------------------------------------------------- Wound Assessment Details Patient Name: [name] Date of Service: 10/23/2016 8:00 AM Medical Record Number: [id number] Patient Account Number: [id number] Date of Birth/Sex: 11-Jan-1946 (71 y.o. Male) Treating  RN: [name] Primary Care Makinze Jani: [facility] [place] Other Clinician: Referring Arneshia Ade: [facility] [place] Treating Oneisha Ammons/Extender: [name] Weeks in Treatment: 40 Wound Status Wound Number: 2 Primary Venous Leg Ulcer Etiology: Wound Location: Right Malleolus - Medial Wound Open Wounding Event: Gradually Appeared Status: Date Acquired: 01/30/2016 Comorbid Cataracts, Chronic Obstructive Weeks Of Treatment: 38 History: Pulmonary Disease (COPD), Clustered Wound: Yes Osteoarthritis Photos Photo Uploaded By: Alejandro Mulling on 10/23/2016 11:25:49 Wound Measurements Length: (cm) 3.5 Width: (cm) 3.2 Depth: (cm) 0.2 Area: (cm) 8.796 Volume: (cm) 1.759 % Reduction in Area: 23.8% % Reduction in Volume: -52.3% Epithelialization: None Tunneling: No Undermining: No Wound Description Full Thickness Without Exposed Foul Odor After Classification: Support Structures Slough/Fibrino Wound Margin: Distinct, outline attached Exudate Large Amount: Exudate Type: Serous Exudate Color: amber Cleansing: No No Wound Bed Granulation Amount: Medium (34-66%) Exposed Structure Granulation Quality:  Pink Fascia Exposed: No Gilmartin, Jamerson C. ([id number]) Necrotic Amount: Medium (34-66%) Fat Layer (Subcutaneous Tissue) Exposed: Yes Necrotic Quality: Adherent Slough Tendon Exposed: No Muscle Exposed: No Joint Exposed: No Bone Exposed: No Periwound Skin Texture Texture Color No Abnormalities Noted: No No Abnormalities Noted: No Moisture Temperature / Pain No Abnormalities Noted: No Temperature: No Abnormality Maceration: Yes Tenderness on Palpation: Yes Wound Preparation Ulcer Cleansing: Rinsed/Irrigated with Saline, Other: soap and water, Topical Anesthetic Applied: Other: lidocaine 4%, Treatment Notes Wound #2 (Right, Medial Malleolus) 1. Cleansed with: Clean wound with Normal Saline Cleanse wound with antibacterial soap and water 2. Anesthetic Topical Lidocaine 4% cream to wound bed prior to debridement 4. Dressing Applied: Hydrafera Blue 5. Secondary Dressing Applied ABD Pad 7. Secured with Tape 3 Layer Compression System - Right Lower Extremity Notes unna to anchor, drawtex Electronic Signature(s) Signed: 10/23/2016 4:34:40 PM By: Alejandro Mulling Entered By: Alejandro Mulling on 10/23/2016 08:30:14 [name] ([id number]) -------------------------------------------------------------------------------- Wound Assessment Details Patient Name: [name] Date of Service: 10/23/2016 8:00 AM Medical Record Number: [id number] Patient Account Number: [id number] Date of Birth/Sex: Jul 13, 1946 (71 y.o. Male) Treating RN: [name] Primary Care Sidhant Helderman: [facility] [place] Other Clinician: Referring Emmarae Cowdery: [facility] [place] Treating Eveleigh Crumpler/Extender: [name] Weeks in Treatment: 40 Wound Status Wound Number: 3 Primary Open Surgical Wound Etiology: Wound Location: Right Lower Leg - Posterior Wound Open Wounding Event: Surgical Injury Status: Date Acquired: 10/08/2016 Comorbid Cataracts, Chronic Obstructive Weeks Of Treatment:  2 History: Pulmonary Disease (COPD), Clustered Wound: No Osteoarthritis Photos Photo Uploaded By: Alejandro Mulling on 10/23/2016 11:25:49 Wound Measurements Length: (cm) 0.3 Width: (cm) 0.3 Depth: (cm) 0.2 Area: (cm) 0.071 Volume: (cm) 0.014 % Reduction in Area: 63.8% % Reduction in Volume: 92.9% Epithelialization: None Tunneling: No Undermining: No Wound Description Classification: Partial Thickness Foul Odor Aft Wound Margin: Distinct, outline attached Slough/Fibrin Exudate Amount: Large Exudate Type: Serous Exudate Color: amber er Cleansing: No o No Wound Bed Granulation Amount: Medium (34-66%) Exposed Structure Granulation Quality: Red, Pink Fascia Exposed: No Necrotic Amount: Medium (34-66%) Fat Layer (Subcutaneous Tissue) Exposed: No Necrotic Quality: Adherent Slough Tendon Exposed: No Marland, Neo C. ([id number]) Muscle Exposed: No Joint Exposed: No Bone Exposed: No Limited to Skin Breakdown Periwound Skin Texture Texture Color No Abnormalities Noted: No No Abnormalities Noted: No Moisture Temperature / Pain No Abnormalities Noted: No Temperature: No Abnormality Tenderness on Palpation: Yes Wound Preparation Ulcer Cleansing: Rinsed/Irrigated with Saline, Other: soap and water, Topical Anesthetic Applied: Other Treatment Notes Wound #3 (Right, Posterior Lower Leg) 1. Cleansed with: Clean wound with Normal Saline Cleanse wound with antibacterial soap and water 2. Anesthetic Topical Lidocaine 4% cream  to wound bed prior to debridement 4. Dressing Applied: Aquacel Ag 5. Secondary Dressing Applied ABD Pad 7. Secured with Tape 3 Layer Compression System - Right Lower Extremity Notes drawtex, aquacel to posterior leg wound, unna to anchor Electronic Signature(s) Signed: 10/23/2016 4:34:40 PM By: Alejandro MullingPinkerton, Debra Entered By: Alejandro MullingPinkerton, Debra on 10/23/2016 08:23:06 [name] StackBLACKWELL, Yossi C.  ([id number]) -------------------------------------------------------------------------------- Vitals Details Patient Name: [name] StackBLACKWELL, Jakub C. Date of Service: 10/23/2016 8:00 AM Medical Record Number: [id number] Patient Account Number: [id number] Date of Birth/Sex: 08-21-1946 [age] y.o. Male) Treating RN: Phillis HaggisPinkerton, [name] Primary Care Riyanna Crutchley: [facility] [place] Other Clinician: Referring Aceson Labell: [facility] [place] Treating Vickee Mormino/Extender: Maxwell CaulOBSON, [name] G Weeks in Treatment: 40 Vital Signs Time Taken: 08:09 Temperature (F): 98.3 Height (in): 76 Pulse (bpm): 84 Weight (lbs): 238 Respiratory Rate (breaths/min): 18 Body Mass Index (BMI): 29 Blood Pressure (mmHg): 150/76 Reference Range: 80 - 120 mg / dl Electronic Signature(s) Signed: 10/23/2016 4:34:40 PM By: Alejandro MullingPinkerton, Debra Entered By: Alejandro MullingPinkerton, Debra on 10/23/2016 08:11:58

## 2016-10-26 ENCOUNTER — Encounter: Payer: Medicare HMO | Attending: Internal Medicine

## 2016-10-26 DIAGNOSIS — Z86718 Personal history of other venous thrombosis and embolism: Secondary | ICD-10-CM | POA: Insufficient documentation

## 2016-10-26 DIAGNOSIS — T8131XD Disruption of external operation (surgical) wound, not elsewhere classified, subsequent encounter: Secondary | ICD-10-CM | POA: Insufficient documentation

## 2016-10-26 DIAGNOSIS — Y832 Surgical operation with anastomosis, bypass or graft as the cause of abnormal reaction of the patient, or of later complication, without mention of misadventure at the time of the procedure: Secondary | ICD-10-CM | POA: Diagnosis not present

## 2016-10-26 DIAGNOSIS — L97212 Non-pressure chronic ulcer of right calf with fat layer exposed: Secondary | ICD-10-CM | POA: Insufficient documentation

## 2016-10-26 DIAGNOSIS — L97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 of skin: Secondary | ICD-10-CM | POA: Insufficient documentation

## 2016-10-26 DIAGNOSIS — Z7901 Long term (current) use of anticoagulants: Secondary | ICD-10-CM | POA: Insufficient documentation

## 2016-10-26 DIAGNOSIS — T85613A 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, initial encounter: Secondary | ICD-10-CM | POA: Insufficient documentation

## 2016-10-26 DIAGNOSIS — I87331 Chronic venous hypertension (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: Secondary | ICD-10-CM | POA: Insufficient documentation

## 2016-10-27 NOTE — Progress Notes (Signed)
[name], [name] ([id number]) Visit Report for 10/26/2016 Arrival Information Details Patient Name: [name], [name]. Date of Service: 10/26/2016 8:00 AM Medical Record Number: [id number] Patient Account Number: [id number] Date of Birth/Sex: 17-Feb-1946 (71 y.o. Male) Treating RN: Huel Coventry Primary Care Noely Kuhnle: [place], [place] Other Clinician: Referring Aurore Redinger: [place] Treating Tashae Inda/Extender: Weeks in Treatment: 40 Visit Information History Since Last Visit Added or deleted any medications: No Patient Arrived: Ambulatory Any new allergies or adverse reactions: No Arrival Time: 08:03 Had a fall or experienced change in No Accompanied By: self activities of daily living that may affect Transfer Assistance: None risk of falls: Patient Identification Verified: Yes Signs or symptoms of abuse/neglect No Secondary Verification Process Yes since last visito Completed: Hospitalized since last visit: No Patient Requires Transmission-Based No Has Dressing in Place as Prescribed: Yes Precautions: Has Compression in Place as Yes Patient Has Alerts: No Prescribed: Has Footwear/Offloading in Place as Yes Prescribed: Right: Surgical Shoe with Pressure Relief Insole Pain Present Now: No Electronic Signature(s) Signed: 10/26/2016 2:35:21 PM By: Elliot Gurney, RN, BSN, Kim RN, BSN Entered By: Elliot Gurney, RN, BSN, Kim on 10/26/2016 78:29:56 [name] ([id number]) -------------------------------------------------------------------------------- Clinic Level of Care Assessment Details Patient Name: [name] Date of Service: 10/26/2016 8:00 AM Medical Record Number: [id number] Patient Account Number: [id number] Date of Birth/Sex: 08-Jul-1946 (71 y.o. Male) Treating RN: Huel Coventry Primary Care Abrey Bradway: [place] Other Clinician: Referring Shakemia Madera: [place] Treating Kvion Shapley/Extender: Weeks in Treatment: 40 Clinic Level of Care Assessment Items TOOL 1 Quantity  Score []  - Use when EandM and Procedure is performed on INITIAL visit 0 ASSESSMENTS - Nursing Assessment / Reassessment []  - General Physical Exam (combine w/ comprehensive assessment (listed just 0 below) when performed on new pt. evals) []  - Comprehensive Assessment (HX, ROS, Risk Assessments, Wounds Hx, etc.) 0 ASSESSMENTS - Wound and Skin Assessment / Reassessment []  - Dermatologic / Skin Assessment (not related to wound area) 0 ASSESSMENTS - Ostomy and/or Continence Assessment and Care []  - Incontinence Assessment and Management 0 []  - Ostomy Care Assessment and Management (repouching, etc.) 0 PROCESS - Coordination of Care []  - Simple Patient / Family Education for ongoing care 0 []  - Complex (extensive) Patient / Family Education for ongoing care 0 []  - Staff obtains Chiropractor, Records, Test Results / Process Orders 0 []  - Staff telephones HHA, Nursing Homes / Clarify orders / etc 0 []  - Routine Transfer to another Facility (non-emergent condition) 0 []  - Routine Hospital Admission (non-emergent condition) 0 []  - New Admissions / Manufacturing engineer / Ordering NPWT, Apligraf, etc. 0 []  - Emergency Hospital Admission (emergent condition) 0 PROCESS - Special Needs []  - Pediatric / Minor Patient Management 0 []  - Isolation Patient Management 0 HIROYUKI, OZANICH ([id number]) []  - Hearing / Language / Visual special needs 0 []  - Assessment of Community assistance (transportation, D/C planning, etc.) 0 []  - Additional assistance / Altered mentation 0 []  - Support Surface(s) Assessment (bed, cushion, seat, etc.) 0 INTERVENTIONS - Miscellaneous []  - External ear exam 0 []  - Patient Transfer (multiple staff / Nurse, adult / Similar devices) 0 []  - Simple Staple / Suture removal (25 or less) 0 []  - Complex Staple / Suture removal (26 or more) 0 []  - Hypo/Hyperglycemic Management (do not check if billed separately) 0 []  - Ankle / Brachial Index (ABI) - do not check if billed  separately 0 Has the patient been seen at the hospital within the last three years: Yes Total Score: 0 Level Of  Care: ____ Electronic Signature(s) Signed: 10/26/2016 2:35:21 PM By: Elliot GurneyWoody, RN, BSN, Kim RN, BSN Entered By: Elliot GurneyWoody, RN, BSN, Kim on 10/26/2016 16:10:9608:29:52 [name] StackBLACKWELL, [name] ([id number]) -------------------------------------------------------------------------------- Compression Therapy Details Patient Name: [name] StackBLACKWELL, [name] Date of Service: 10/26/2016 8:00 AM Medical Record Number: [id number] Patient Account Number: [id number] Date of Birth/Sex: 01-19-1946 6(70 y.o. Male) Treating RN: Huel CoventryWoody, Kim Primary Care Marilyn Wing: [place] Other Clinician: Referring Cleona Doubleday: [place] Treating Marijah Larranaga/Extender: Rudene ReBritto, Errol Weeks in Treatment: 40 Compression Therapy Performed for Wound Wound #1 Right,Lateral Malleolus Assessment: Performed By: Cletus Gashlinician Woody, Kim, RN Compression Type: Three Layer Pre Treatment ABI: 1.2 Electronic Signature(s) Signed: 10/26/2016 2:35:21 PM By: Elliot GurneyWoody, RN, BSN, Kim RN, BSN Entered By: Elliot GurneyWoody, RN, BSN, Kim on 10/26/2016 08:25:33 [name] StackBLACKWELL, Arshawn C. ([id number]) -------------------------------------------------------------------------------- Compression Therapy Details Patient Name: [name] StackBLACKWELL, [name] Date of Service: 10/26/2016 8:00 AM Medical Record Number: [id number] Patient Account Number: [id number] Date of Birth/Sex: 01-19-1946 1(70 y.o. Male) Treating RN: Huel CoventryWoody, Kim Primary Care Kiffany Schelling: [place] Other Clinician: Referring Keltin Baird: [place] Treating Cortlandt Capuano/Extender: Rudene ReBritto, Errol Weeks in Treatment: 40 Compression Therapy Performed for Wound Wound #2 Right,Medial Malleolus Assessment: Performed By: Cletus Gashlinician Woody, Kim, RN Compression Type: Three Layer Pre Treatment ABI: 1.2 Electronic Signature(s) Signed: 10/26/2016 2:35:21 PM By: Elliot GurneyWoody, RN, BSN, Kim RN, BSN Entered By: Elliot GurneyWoody, RN, BSN, Kim on 10/26/2016  08:25:33 [name] StackBLACKWELL, [name] ([id number]) -------------------------------------------------------------------------------- Compression Therapy Details Patient Name: [name] StackBLACKWELL, Lior C. Date of Service: 10/26/2016 8:00 AM Medical Record Number: [id number] Patient Account Number: [id number] Date of Birth/Sex: 01-19-1946 [age] y.o. Male) Treating RN: Huel CoventryWoody, Kim Primary Care Joli Koob: [place] Other Clinician: Referring Jilliam Bellmore: [place] Treating Damione Robideau/Extender: Rudene ReBritto, Errol Weeks in Treatment: 40 Compression Therapy Performed for Wound Wound #3 Right,Posterior Lower Leg Assessment: Performed By: Cletus Gashlinician Woody, Kim, RN Compression Type: Three Layer Pre Treatment ABI: 1.2 Electronic Signature(s) Signed: 10/26/2016 2:35:21 PM By: Elliot GurneyWoody, RN, BSN, Kim RN, BSN Entered By: Elliot GurneyWoody, RN, BSN, Kim on 10/26/2016 08:25:33 [name] StackBLACKWELL, [name] ([id number]) -------------------------------------------------------------------------------- Encounter Discharge Information Details Patient Name: [name] StackBLACKWELL, Tracen C. Date of Service: 10/26/2016 8:00 AM Medical Record Number: [id number] Patient Account Number: [id number] Date of Birth/Sex: 01-19-1946 [age] y.o. Male) Treating RN: Huel CoventryWoody, Kim Primary Care Lenea Bywater: [place] Other Clinician: Referring Reather Steller: [place] Treating Satori Krabill/Extender: Weeks in Treatment: 7440 Encounter Discharge Information Items Discharge Pain Level: 0 Discharge Condition: Stable Ambulatory Status: Ambulatory Discharge Destination: Home Private Transportation: Auto Accompanied By: self Schedule Follow-up Appointment: Yes Medication Reconciliation completed and Yes provided to Patient/Care Tiffaney Heimann: Clinical Summary of Care: Electronic Signature(s) Signed: 10/26/2016 2:35:21 PM By: Elliot GurneyWoody, RN, BSN, Kim RN, BSN Entered By: Elliot GurneyWoody, RN, BSN, Kim on 10/26/2016 08:29:45 [name] StackBLACKWELL, [name]  ([id number]) -------------------------------------------------------------------------------- Patient/Caregiver Education Details Patient Name: [name] StackBLACKWELL, [name] Date of Service: 10/26/2016 8:00 AM Medical Record Number: [id number] Patient Account Number: [id number] Date of Birth/Gender: 01-19-1946 [age] y.o. Male) Treating RN: Huel CoventryWoody, Kim Primary Care Physician: [place] Other Clinician: Referring Physician: [place] Treating Physician/Extender: Tania AdeWeeks in Treatment: 1140 Education Assessment Education Provided To: Patient Education Topics Provided Venous: Handouts: Controlling Swelling with Multilayered Compression Wraps Methods: Demonstration, Explain/Verbal Responses: State content correctly Electronic Signature(s) Signed: 10/26/2016 2:35:21 PM By: Elliot GurneyWoody, RN, BSN, Kim RN, BSN Entered By: Elliot GurneyWoody, RN, BSN, Kim on 10/26/2016 09:32:3508:28:52 [name] StackBLACKWELL, [name] ([id number]) -------------------------------------------------------------------------------- Wound Assessment Details Patient Name: [name] StackBLACKWELL, [name] Date of Service: 10/26/2016 8:00 AM Medical Record Number: [id number] Patient Account Number: [id number] Date of Birth/Sex: 01-19-1946 [age] y.o. Male) Treating RN: Huel CoventryWoody, Kim Primary Care Seth Friedlander: [place] Other Clinician:  Referring Zuriel Yeaman: [place] Treating Milburn Freeney/Extender: Rudene Re in Treatment: 40 Wound Status Wound Number: 1 Primary Etiology: Venous Leg Ulcer Wound Location: Right, Lateral Malleolus Wound Status: Open Wounding Event: Gradually Appeared Date Acquired: 08/11/2015 Weeks Of Treatment: 40 Clustered Wound: No Wound Measurements Length: (cm) 7 Width: (cm) 8 Depth: (cm) 0.2 Area: (cm) 43.982 Volume: (cm) 8.796 % Reduction in Area: -154.5% % Reduction in Volume: -69.7% Wound Description Full Thickness Without Exposed Classification: Support Structures Periwound Skin Texture Texture Color No Abnormalities Noted: No No  Abnormalities Noted: No Moisture No Abnormalities Noted: No Treatment Notes Wound #1 (Right, Lateral Malleolus) 1. Cleansed with: Clean wound with Normal Saline Cleanse wound with antibacterial soap and water 2. Anesthetic Topical Lidocaine 4% cream to wound bed prior to debridement 4. Dressing Applied: Hydrafera Blue 5. Secondary Dressing Applied ABD Pad 7. Secured with Tape HARM, JOU ([id number]) 3 Layer Compression System - Right Lower Extremity Notes unna to anchor Electronic Signature(s) Signed: 10/26/2016 2:35:21 PM By: Elliot Gurney, RN, BSN, Kim RN, BSN Entered By: Elliot Gurney, RN, BSN, Kim on 10/26/2016 08:25:10 [name] ([id number]) -------------------------------------------------------------------------------- Wound Assessment Details Patient Name: [name] Date of Service: 10/26/2016 8:00 AM Medical Record Number: [id number] Patient Account Number: [id number] Date of Birth/Sex: 09/17/1945 (71 y.o. Male) Treating RN: Huel Coventry Primary Care Genesi Stefanko: [place] Other Clinician: Referring Agostino Gorin: [place] Treating Dailyn Kempner/Extender: Rudene Re in Treatment: 40 Wound Status Wound Number: 2 Primary Etiology: Venous Leg Ulcer Wound Location: Right, Medial Malleolus Wound Status: Open Wounding Event: Gradually Appeared Date Acquired: 01/30/2016 Weeks Of Treatment: 38 Clustered Wound: Yes Wound Measurements Length: (cm) 3.5 Width: (cm) 3.2 Depth: (cm) 0.2 Area: (cm) 8.796 Volume: (cm) 1.759 % Reduction in Area: 23.8% % Reduction in Volume: -52.3% Wound Description Full Thickness Without Exposed Classification: Support Structures Periwound Skin Texture Texture Color No Abnormalities Noted: No No Abnormalities Noted: No Moisture No Abnormalities Noted: No Treatment Notes Wound #2 (Right, Medial Malleolus) 1. Cleansed with: Clean wound with Normal Saline Cleanse wound with antibacterial soap and water 2.  Anesthetic Topical Lidocaine 4% cream to wound bed prior to debridement 4. Dressing Applied: Hydrafera Blue 5. Secondary Dressing Applied ABD Pad 7. Secured with Tape MEHAR, KIRKWOOD ([id number]) 3 Layer Compression System - Right Lower Extremity Notes unna to anchor Electronic Signature(s) Signed: 10/26/2016 2:35:21 PM By: Elliot Gurney, RN, BSN, Kim RN, BSN Entered By: Elliot Gurney, RN, BSN, Kim on 10/26/2016 08:25:10 [name] ([id number]) -------------------------------------------------------------------------------- Wound Assessment Details Patient Name: [name] Date of Service: 10/26/2016 8:00 AM Medical Record Number: [id number] Patient Account Number: [id number] Date of Birth/Sex: 10/22/1945 (71 y.o. Male) Treating RN: Huel Coventry Primary Care Benjaman Artman: [place] Other Clinician: Referring Shneur [place]: [place] Treating Maurene Hollin/Extender: Rudene Re in Treatment: 40 Wound Status Wound Number: 3 Primary Etiology: Open Surgical Wound Wound Location: Right, Posterior Lower Leg Wound Status: Open Wounding Event: Surgical Injury Date Acquired: 10/08/2016 Weeks Of Treatment: 2 Clustered Wound: No Wound Measurements Length: (cm) 0.2 Width: (cm) 0.2 Depth: (cm) 0.1 Area: (cm) 0.031 Volume: (cm) 0.003 % Reduction in Area: 84.2% % Reduction in Volume: 98.5% Wound Description Classification: Partial Thickness Periwound Skin Texture Texture Color No Abnormalities Noted: No No Abnormalities Noted: No Moisture No Abnormalities Noted: No Treatment Notes Wound #3 (Right, Posterior Lower Leg) 1. Cleansed with: Clean wound with Normal Saline Cleanse wound with antibacterial soap and water 2. Anesthetic Topical Lidocaine 4% cream to wound bed prior to debridement 4. Dressing Applied: Aquacel Ag 5. Secondary Dressing  Applied ABD Pad 7. Secured with Tape 3 Layer Compression System - Right Lower Extremity EDWARD, GUTHMILLER  ([id number]) Notes unna to anchor Electronic Signature(s) Signed: 10/26/2016 2:35:21 PM By: Elliot Gurney, RN, BSN, Kim RN, BSN Entered By: Elliot Gurney, RN, BSN, Kim on 10/26/2016 08:25:11

## 2016-10-30 ENCOUNTER — Encounter: Payer: Medicare HMO | Admitting: Internal Medicine

## 2016-10-30 ENCOUNTER — Ambulatory Visit (INDEPENDENT_AMBULATORY_CARE_PROVIDER_SITE_OTHER): Payer: Medicare HMO | Admitting: Vascular Surgery

## 2016-10-30 ENCOUNTER — Encounter (INDEPENDENT_AMBULATORY_CARE_PROVIDER_SITE_OTHER): Payer: Self-pay | Admitting: Vascular Surgery

## 2016-10-30 VITALS — BP 149/72 | HR 88 | Resp 16 | Ht 76.0 in | Wt 229.0 lb

## 2016-10-30 DIAGNOSIS — L97312 Non-pressure chronic ulcer of right ankle with fat layer exposed: Secondary | ICD-10-CM | POA: Diagnosis not present

## 2016-10-30 DIAGNOSIS — M7989 Other specified soft tissue disorders: Secondary | ICD-10-CM | POA: Diagnosis not present

## 2016-10-30 DIAGNOSIS — I83019 Varicose veins of right lower extremity with ulcer of unspecified site: Secondary | ICD-10-CM | POA: Diagnosis not present

## 2016-10-30 DIAGNOSIS — L97919 Non-pressure chronic ulcer of unspecified part of right lower leg with unspecified severity: Principal | ICD-10-CM

## 2016-10-30 DIAGNOSIS — I87331 Chronic venous hypertension (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: Secondary | ICD-10-CM | POA: Diagnosis not present

## 2016-10-30 NOTE — Progress Notes (Signed)
MRN : [id number]  [name] is a 71 y.o. (1945-10-30) male who presents with chief complaint of  Chief Complaint  Patient presents with  . Re-evaluation    3-4 week post laser  .  History of Present Illness: Patient returns today in follow up of Venous insufficiency. He has undergone right great saphenous vein and small saphenous vein laser ablation with marked improvement in the swelling in his leg and marked improvement of his medial and lateral ankle ulcerations. These were larger than a $0.50 piece before and are now down to the size of a dime. He had no periprocedural complications and is doing well today.        Past Medical History:  Diagnosis Date  . Allergy   . DVT (deep venous thrombosis) (HCC)          Past Surgical History:  Procedure Laterality Date  . CHOLECYSTECTOMY    . FOOT SURGERY Right   . HAND AMPUTATION Left   . IVC FILTER PLACEMENT ([facility] HX)    . SPLENECTOMY, TOTAL      Family History No bleeding disorders, clotting disorders, autoimmune diseases or aneurysms  Social History     Social History  Substance Use Topics  . Smoking status: Former Games developermoker  . Smokeless tobacco: Never Used  . Alcohol use No  No IVDU  No Known Allergies        Current Outpatient Prescriptions  Medication Sig Dispense Refill  . cetirizine (ZYRTEC) 10 MG tablet Take by mouth.    . Cyanocobalamin (B-12) 1000 MCG CAPS Take by mouth.    . folic acid (FOLVITE) 1 MG tablet Take by mouth.    . Macitentan 10 MG TABS Take by mouth.    . Multiple Vitamin (MULTI-VITAMINS) TABS Take by mouth.    . Omega-3 Fatty Acids (FISH OIL PO) Take by mouth.    . tadalafil, PAH, (ADCIRCA) 20 MG tablet Take by mouth.     No current facility-administered medications for this visit.       REVIEW OF SYSTEMS (Negative unless checked)  Constitutional: [] Weight loss  [] Fever  [] Chills Cardiac: [] Chest pain   [] Chest pressure   [] Palpitations    [] Shortness of breath when laying flat   [] Shortness of breath at rest   [] Shortness of breath with exertion. 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   [x] Non-healing ulcers Pulmonary:   [] Uses home oxygen   [] Productive cough   [] Hemoptysis   [] Wheeze  [] COPD   [] Asthma Neurologic:  [] Dizziness  [] Blackouts   [] Seizures   [] History of stroke   [] History of TIA  [] Aphasia   [] Temporary blindness   [] Dysphagia   [] Weakness or numbness in arms   [] Weakness or numbness in legs Musculoskeletal:  [] Arthritis   [] Joint swelling   [] Joint pain   [] Low back pain Hematologic:  [] Easy bruising  [] Easy bleeding   [] Hypercoagulable state   [] Anemic  [] Hepatitis Gastrointestinal:  [] Blood in stool   [] Vomiting blood  [] Gastroesophageal reflux/heartburn   [] Abdominal pain Genitourinary:  [] Chronic kidney disease   [] Difficult urination  [] Frequent urination  [] Burning with urination   [] Hematuria Skin:  [] Rashes   [x] Ulcers   [  x]Wounds Psychological:  [] History of anxiety   []  History of major depression.    Physical Exam BP (!) 162/76   Pulse (!) 103   Resp 16   Ht 6\' 4"  (1.93 m)   Wt 228 lb 9.6 oz (103.7 kg)   BMI 27.83 kg/m  Gen:  WD/WN, NAD Head: [place]/AT, No temporalis wasting. Prominent temp pulse not noted. Ear/Nose/Throat: Hearing grossly intact, nares w/o erythema or drainage, oropharynx w/o Erythema/Exudate Eyes: Conjunctiva clear, sclera non-icteric  Neck: trachea midline.  No bruit or JVD.  Pulmonary:  Good air movement, clear to auscultation bilaterally.  Cardiac: RRR, normal S1, S2, no Murmurs, rubs or gallops. Vascular:  Vessel Right Left  Radial Palpable Palpable                                   Gastrointestinal:  soft, non-tender/non-distended. No guarding/reflex. No masses, surgical incisions, or scars. Musculoskeletal: M/S 5/5 throughout.  Extremities without ischemic changes. Left hand amputation. 1+ right lower extremity edema and no appreciable left lower extremity edema. Neurologic: Sensation grossly intact in extremities.  Symmetrical.  Speech is fluent. Motor exam as listed above. Psychiatric: Judgment intact, Mood & affect appropriate for pt's clinical situation. Dermatologic: Right Medial and lateral ankle wound is dressed today.He states are markedly improved Lymph : No Cervical, Axillary, or Inguinal lymphadenopathy    Labs Recent Results (from the past 2160 hour(s))  Aerobic/Anaerobic Culture (surgical/deep wound)     Status: None   Collection Time: 08/03/16  8:19 AM  Result Value Ref Range   Specimen Description ANKLE RIGHT LATERAL MALLEOLUS    Special Requests NONE    Gram Stain      RARE WBC PRESENT, PREDOMINANTLY MONONUCLEAR NO ORGANISMS SEEN    Culture      RARE PROTEUS MIRABILIS RARE PSEUDOMONAS AERUGINOSA NO ANAEROBES ISOLATED Performed at [facility]    Report Status 08/08/2016 FINAL    Organism ID, Bacteria PROTEUS MIRABILIS    Organism ID, Bacteria PSEUDOMONAS AERUGINOSA       Susceptibility   Pseudomonas aeruginosa - MIC*    CEFTAZIDIME 4 SENSITIVE Sensitive     CIPROFLOXACIN <=0.25 SENSITIVE Sensitive     GENTAMICIN <=1 SENSITIVE Sensitive     IMIPENEM 2 SENSITIVE Sensitive     PIP/TAZO 8 SENSITIVE Sensitive     CEFEPIME 2 SENSITIVE Sensitive     * RARE PSEUDOMONAS AERUGINOSA   Proteus mirabilis - MIC*    AMPICILLIN <=2 SENSITIVE Sensitive     CEFAZOLIN <=4 SENSITIVE Sensitive     CEFEPIME <=1 SENSITIVE Sensitive     CEFTAZIDIME <=1 SENSITIVE Sensitive     CEFTRIAXONE <=1 SENSITIVE Sensitive     CIPROFLOXACIN <=0.25 SENSITIVE Sensitive     GENTAMICIN <=1 SENSITIVE Sensitive     IMIPENEM 8 INTERMEDIATE Intermediate     TRIMETH/SULFA <=20 SENSITIVE  Sensitive     AMPICILLIN/SULBACTAM <=2 SENSITIVE Sensitive     PIP/TAZO <=4 SENSITIVE Sensitive     * RARE PROTEUS MIRABILIS  Aerobic Culture (superficial specimen)     Status: None   Collection Time: 10/09/16  8:25 AM  Result Value Ref Range   Specimen Description LEG    Special Requests RIGHT POSTERIOIR LOWER LEG    Gram Stain      FEW WBC PRESENT,BOTH PMN AND MONONUCLEAR NO ORGANISMS SEEN    Culture      NO GROWTH 2 DAYS Performed at [facility]  [facility], [address], [place], [place] [address]    Report Status 10/11/2016 FINAL     Radiology No results found.   Assessment/Plan  No problem-specific Assessment & Plan notes found for this encounter.    Festus Barren, MD  10/30/2016 11:32 AM    This note was created with Dragon medical transcription system.  Any errors from dictation are purely unintentional

## 2016-10-30 NOTE — Assessment & Plan Note (Signed)
Improving but not entirely healed

## 2016-10-30 NOTE — Assessment & Plan Note (Signed)
Improved after laser ablation 

## 2016-10-30 NOTE — Assessment & Plan Note (Signed)
He has had a significant improvement after laser ablation of the right great and small saphenous veins. He should continue to elevate his leg and wear compression stockings. I'll plan to see him back in 3-4 months

## 2016-10-31 NOTE — Progress Notes (Addendum)
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 brought him back in on Friday for a dressing change has been using Iodoflex. States his pain level is better 02/29/16; still complaining of a lot of drainage even though we are putting absorbent material over the Santyl and bringing him back on Fridays for dressing changes. He is not complaining of pain. Her intake nurse notes blistering 03/07/16: pt returns today for f/u. he admits out in rain on Saturday and soaked his right leg. he did not share with his wife and he didn't notify the [facility]. he has an odor today that is c/w pseudomonas. Wound has greenish tan slough. there is no periwound erythema, induration, or fluctuance. wound has deteriorated since previous visit. denies fever, chills, body aches or malaise. no increased pain. 03/13/16: C+S showed proteus. He has not received AB'S. Switched to RTD last week. 03/27/16 patient is been using Iodoflex. Wound bed has improved and debridement is certainly easier 04/10/2016 -- he has been scheduled for a venous duplex study towards the end of the month 04/17/16; has been using silver alginate, states  that the Iodoflex was hurting his wound and since that is been changed he has had no pain unfortunately the surface of the wound continues to be unhealthy with Rigaud, Polk C. ([id number]) thick gelatinous slough and nonviable tissue. The wound will not heal like this. 04/20/2016 -- the patient was here for a nurse visit but I was asked to see the patient as the slough was quite significant and the nurse needed for clarification regarding the ointment to be used. 04/24/16; the patient's wounds on the right medial and right lateral ankle/malleolus both look a lot better today. Less adherent slough healthier tissue. Dimensions better especially medially 05/01/16; the patient's wound surface continues to improve however he continues to require debridement switch her easier each week. Continue Santyl/Metahydrin mixture Hydrofera Blue next week. Still drainage on the medial aspect according to the intake nurse 05/08/16; still using Santyl and Medihoney. Still a lot of drainage per her intake nurse. Patient has no complaints pain fever chills etc. 05/15/16 switched the Hydrofera Blue last week. Dimensions down especially in the medial right leg wound. Area on the lateral which is more substantial also looks better still requires debridement 05/22/16; we have been using Hydrofera  Blue. 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 Currently he tells me that the pain is doing very well about a 1 out of 10 today. Yesterday was a little bit worse but he tells me that he was more active watering his flowers that day. Overall he feels that his symptoms are doing significantly better at this point in time. His edema continues to be controlled well with the 4-layer compression wrap and he really has not noted any odor at this point in time. He  is tolerating the dressing changes when they are performed well. 06/05/16 at this point in time today patient currently shows no interval signs or symptoms of local or systemic infection. Again his pain level he rates to be a 1 out of 10 at most and overall he tells me that generally this is not giving him much trouble. In fact he even feels maybe a little bit better than last week. We have continue with the 4-layer compression wrap in which she tolerates very well at this point. He is continuing to utilize the [place] dressing. 06/12/16 I think there has been some progression in the status of both of these wounds over today again covered in a gelatinous surface. Has been using Hydrofera Blue. We had used Iodoflex in the past I'm not sure if there was an issue other than changing to something that might progress towards closure faster 06/19/16; he did not tolerate the Flexeril last week secondary to pain and this was changed on Friday back to [facility] area he continues to have copious amounts of gelatinous surface slough which is think inhibiting the speed of healing this area 06/26/16 patient over the last week has utilized the Santyl to try to loosen up some of the tightly adherent slough that was noted on evaluation last week. The good news is he tells me that the medial malleoli region really does not bother him the right llateral malleoli region is more tender to palpation at this point in time especially in the central/inferior location. However it does appear that the Santyl has done his job to loosen up the adherent slough at this point in time. Fortunately he has no interval signs or symptoms of infection locally or systemically no purulent discharge noted. 07/03/16 at this point in time today patient's wounds appear to be significantly improved over the right medial and lateral malleolus locations. He has much less tenderness at this point in time and the wounds  appear clean her although there is still adherent slough this is sufficiently improved over what I saw last week. I still see no evidence of local infection. 07/10/16; continued gradual improvement in the right medial and lateral malleolus locations. The lateral is more substantial wound now divided into 2 by a rim of normal epithelialization. 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 He denies any complaints of pain or intolerance to compression. Both ulcers are smaller in size oriented today's measurements, both are covered with a softly adherent slough. 07/24/16; medial wound is smaller, lateral about the same although surface looks better. Still using [name], Derald C. ([id number]) Hydrofera Blue 07/31/16; arrives today complaining of pain in the lateral part of his foot. Nurse reports a lot more drainage. He has been using  Hydrofera Blue. Switch to silver alginate today 08/03/2016 -- I was asked to see the patient was here for a nurse visit today. I understand he had a lot of pain in his right lower extremity and was having blisters on his right foot which have not been there before. Though he started on doxycycline he does not have blisters elsewhere on his body. I do not believe this is a drug allergy. also mentioned that there was a copious purulent discharge from the wound and clinically there is no evidence of cellulitis. 08/07/16; I note that the patient came in for his nurse check on Friday apparently with blisters on his toes on the right than a lot of swelling in his forefoot. He continued on the doxycycline that I had prescribed on 12/8. A culture was done of the lateral wound that showed a combination of a few Proteus and Pseudomonas. Doxycycline might of covered the Proteus but would be unlikely to cover the Pseudomonas. He is on Coumadin. He arrives in  the clinic today feeling a lot better states the pain is a lot better but nothing specific really was done other than to rewrap the foot also noted that he had arterial studies ordered in August although these were never done. It is reasonable to go ahead and reorder these. 08/14/16; generally arrives in a better state today in terms of the wounds he has taken cefdinir for one week. Our intake nurse reports copious amounts of drainage but the patient is complaining of much less pain. He is not had his PT and INR checked and I've asked him to do this today or tomorrow. 08/24/2016 -- patient arrives today after 10 days and said he had a stomach upset. His arterial study was done and I have reviewed this report and find it to be within normal limits. However I did not note any venous duplex studies for reflux, and Dr. Leanord Hawking may have ordered these in the past but I will leave it to him to decide if he needs these. The patient has finished his course of cefdinir. 08/28/16; patient arrives today again with copious amounts of thick really green drainage for our intake nurse. He states he has a very tender spot at the superior part of the lateral wound. Wounds are larger 09/04/16; no real change in the condition of this patient's wound still copious amounts of surface slough. Started him on Iodoflex last week he is completing another course of Cefdinir or which I think was done empirically. His arterial study showed ABIs were 1.1 on the right 1.5 on the left. He did have a slightly reduced ABI in the right the left one was not obtained. Had calcification of the right posterior tibial artery. The interpretation was no segmental stenosis. His waveforms were triphasic. His reflux studies are later this month. Depending on this I'll send him for a vascular consultation, he may need to see plastic surgery as I believe he is had plastic surgery on this foot in the past. He had an injury to the foot in the  1980s. 1/16 /18 right lateral greater than right medial ankle wounds on the right in the setting of previous skin grafting. 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 He does not have arterial issues. Each week he comes in with the same adherent surface slough although there was less of this today 09/18/16; right lateral greater  than right medial lower extremity wounds in the setting of previous skin grafting and trauma. He has least to vein laser ablation scheduled for February 2 for venous reflux. He does not have significant arterial disease. Problem has been very difficult to handle surface slough/necrotic tissue. Recently using Iodoflex for this with some, albeit slow improvement 09/25/16; right lateral greater than right medial lower extremity venous wounds in the setting of previous skin grafting. He is going for ablation surgery on February 2 after this he'll come back here for rewrap. He has been using Iodoflex as the primary dressing. 10/02/16; right lateral greater than right medial lower extremity wounds in the setting of previous skin grafting. He had his ablation surgery last week, I don't have a report. He tolerated this well. Came in with a thigh- high Unna boots on Friday. We have been using Iodoflex as the primary dressing. His measurements are improving 10/09/16; continues to make nice aggressive in terms of the wounds on his lateral and medial right ankle in the setting of previous skin grafting. Yesterday he noticed drainage at one of his surgical sites from his IZAIYAH, KLEINMAN. ([id number]) venous ablation on the right calf. He took off the bandage over this area felt a "popping" sensation and a reddish-brown drainage. He is not complaining of any pain 10/16/16; he continues to make nice progression in terms of the wounds on the lateral and medial malleolus. Both smaller using Iodoflex. He had a  surgical area in his posterior mid calf we have been using iodoform. All the wounds are down and dimensions 10/23/16; the patient arrives today with no complaints. He states the Iodoflex is a bit uncomfortable. He is not systemically unwell. We have been using Iodoflex to the lateral right ankle and the medial and Aquacel Ag to the reflux surgical wound on the posterior right calf. All of these wounds are doing well 10/30/16; patient states he has no pain no systemic symptoms. I changed him to [facility] last week. Although the wounds are doing well Objective Constitutional Sitting or standing Blood Pressure is within target range for patient.. Pulse regular and within target range for patient.Marland Kitchen Respirations regular, non-labored and within target range.. Temperature is normal and within the target range for the patient.. Patient's appearance is neat and clean. Appears in no acute distress. Well nourished and well developed.. Vitals Time Taken: 8:01 AM, Height: 76 in, Weight: 238 lbs, BMI: 29, Temperature: 98.3 F, Pulse: 80 bpm, Respiratory Rate: 18 breaths/min, Blood Pressure: 148/67 mmHg. Eyes Conjunctivae clear. No discharge.Marland Kitchen Respiratory Respiratory effort is easy and symmetric bilaterally. Rate is normal at rest and on room air.. Cardiovascular Pedal pulses palpable and strong bilaterally.. Good edema control. Lymphatic Nonpalpable in the popliteal or inguinal area. Psychiatric No evidence of depression, anxiety, or agitation. Calm, cooperative, and communicative. Appropriate interactions and affect.. General Notes: Wound exam; his original wound on the right lateral ankle continues to progress nicely in terms of size. The area on the medial ankle also is down and dimensions. Both appear to have healthy underlying granulation tissue perhaps about 20% slough no debridement is felt to be necessary today. The area on the right posterior calf which was a postsurgical wound is barely  visible today. KYRIE, FLUDD ([id number]) Integumentary (Hair, Skin) Wound #1 status is Open. Original cause of wound was Gradually Appeared. The wound is located on the Right,Lateral Malleolus. The wound measures 6cm length x 6.4cm width x 0.2cm depth; 30.159cm^2 area and 6.032cm^3 volume. The wound  is limited to skin breakdown. There is no tunneling or undermining noted. There is a large amount of serosanguineous drainage noted. The wound margin is distinct with the outline attached to the wound base. There is medium (34-66%) red granulation within the wound bed. There is a medium (34-66%) amount of necrotic tissue within the wound bed including Adherent Slough. Periwound temperature was noted as No Abnormality. Wound #2 status is Open. Original cause of wound was Gradually Appeared. The wound is located on the Right,Medial Malleolus. The wound measures 3cm length x 2.5cm width x 0.2cm depth; 5.89cm^2 area and 1.178cm^3 volume. The wound is limited to skin breakdown. There is no tunneling or undermining noted. There is a large amount of serosanguineous drainage noted. The wound margin is distinct with the outline attached to the wound base. There is large (67-100%) red granulation within the wound bed. There is a small (1-33%) amount of necrotic tissue within the wound bed including Adherent Slough. The periwound skin appearance exhibited: Maceration. Periwound temperature was noted as No Abnormality. The periwound has tenderness on palpation. Wound #3 status is Open. Original cause of wound was Surgical Injury. The wound is located on the Right,Posterior Lower Leg. The wound measures 0.2cm length x 0.2cm width x 0.1cm depth; 0.031cm^2 area and 0.003cm^3 volume. There is no tunneling or undermining noted. There is a large amount of serosanguineous drainage noted. The wound margin is distinct with the outline attached to the wound base. There is large (67-100%) red granulation within the  wound bed. There is no necrotic tissue within the wound bed. Periwound temperature was noted as No Abnormality. 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 ([id number]) Cleanse wound with mild soap and water May shower with protection. No tub bath. Wound #2 Right,Medial Malleolus: Cleanse wound with mild soap and water May shower with protection. No tub bath. Wound #3 Right,Posterior Lower Leg: Cleanse wound with mild soap and water May shower with protection. No tub bath. Anesthetic: Wound #1 Right,Lateral Malleolus: Topical Lidocaine 4% cream applied to wound bed prior to debridement Wound #2 Right,Medial Malleolus: Topical Lidocaine 4% cream applied to wound bed prior to debridement Wound #3 Right,Posterior Lower Leg: Topical Lidocaine 4% cream applied to wound bed prior to debridement Skin Barriers/Peri-Wound Care: Wound #1 Right,Lateral Malleolus: Barrier cream - zinc oxide Triamcinolone Acetonide Ointment Primary Wound Dressing: Wound #1 Right,Lateral Malleolus: Hydrafera Blue Wound #2 Right,Medial Malleolus: Hydrafera Blue Wound #3 Right,Posterior Lower Leg: Aquacel Ag Secondary Dressing: Wound #1 Right,Lateral Malleolus: ABD pad Drawtex Wound #2 Right,Medial Malleolus: ABD pad Drawtex Wound #3 Right,Posterior Lower Leg: ABD pad Dressing Change  Frequency: Wound #1 Right,Lateral Malleolus: Change dressing every week Other: - nurse visit Friday Wound #2 Right,Medial Malleolus: Change dressing every week Other: - nurse visit Friday Wound #3 Right,Posterior Lower Leg: Change dressing every day. Other: - nurse visit Friday Follow-up Appointments: Wound #1 Right,Lateral Malleolus: Return Appointment in 1 week. CHEY, CHO ([id number]) Other: - nurse visit Friday Wound #2 Right,Medial Malleolus: Return Appointment in 1 week.  Other: - nurse visit Friday Wound #3 Right,Posterior Lower Leg: Return Appointment in 1 week. Other: - nurse visit Friday Edema Control: Wound #1 Right,Lateral Malleolus: 3 Layer Compression System - Right Lower Extremity - UNNA TO ANCHOR Elevate legs to the level of the heart and pump ankles as often as possible Wound #2 Right,Medial Malleolus: 3 Layer Compression System - Right Lower Extremity - UNNA TO ANCHOR Elevate legs to the level of the heart and pump ankles as often as possible Wound #3 Right,Posterior Lower Leg: 3 Layer Compression System - Right Lower Extremity - UNNA TO ANCHOR Elevate legs to the level of the heart and pump ankles as often as possible Additional Orders / Instructions: Wound #1 Right,Lateral Malleolus: Increase protein intake. OK to return to work with the following restrictions: Activity as tolerated Wound #2 Right,Medial Malleolus: Increase protein intake. OK to return to work with the following restrictions: Activity as tolerated Wound #3 Right,Posterior Lower Leg: Increase protein intake. OK to return to work with the following restrictions: Activity as tolerated #1 continue with Hydrofera Blue to the ankle wounds which seems to resulted in a nice reduction in wound dimensions today #2 continue collagen to the right posterior leg which is barely open at all. #3 he will need compression stockings as we start to look forward to discharge down the road. #4 he  does not have any arterial issues. No signs of infection VANESSA, ALESI ([id number]) Electronic Signature(s) Signed: 10/31/2016 12:54:47 PM By: Elliot Gurney RN, BSN, Kim RN, BSN Signed: 11/01/2016 5:01:50 PM By: [name] MD Previous Signature: 10/31/2016 8:02:19 AM Version By: [name] MD Entered By: Elliot Gurney RN, BSN, Kim on 10/31/2016 12:54:47 [name] ([id number]) -------------------------------------------------------------------------------- SuperBill Details [name] Date of Service: 10/30/2016 Patient Name: C. Patient Account Number: [id number] Medical Record Treating RN: [name] [id number] Number: Other Clinician: Date of Birth/Sex: [birth year] (70 y.o. 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, initial T85.613A encounter Disruption of external operation (surgical) wound, not elsewhere classified, subsequent T81.31XD encounter L97.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 of skin Facility Procedures CPT4: Description Modifier Quantity Code [id number] (Facility Use Only) [phone] - APPLY MULTLAY COMPRS LWR RT 1 LEG Physician Procedures CPT4: Description Modifier Quantity Code [id number] - WC PHYS LEVEL 3 - EST PT 1 ICD-10 Description Diagnosis I87.331 Chronic venous hypertension (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 L97.212 Non-pressure chronic ulcer of  right calf with fat layer exposed Electronic  Signature(s) Signed: 10/30/2016 3:56:24 PM By: Alejandro Mulling Signed: 10/31/2016 8:02:19 AM By: [name] MD Entered By: Alejandro Mulling on 10/30/2016 08:34:55 [name] ([id number])

## 2016-10-31 NOTE — Progress Notes (Signed)
[name] ([id number]) Visit Report for 10/30/2016 Arrival Information Details Patient Name: [name], [name]. Date of Service: 10/30/2016 8:00 AM Medical Record Number: [id number] Patient Account Number: [id number] Date of Birth/Sex: February 20, 1946 (71 y.o. Male) Treating RN: [name] Primary Care Zissy Hamlett: [facility] [place] Other Clinician: Referring Angelyse Heslin: [facility] [place] Treating Smita Lesh/Extender: Altamese Hawley in Treatment: 41 Visit Information History Since Last Visit All ordered tests and consults were completed: No Patient Arrived: Ambulatory Added or deleted any medications: No Arrival Time: 08:00 Any new allergies or adverse reactions: No Accompanied By: self Had a fall or experienced change in No Transfer Assistance: None activities of daily living that may affect Patient Identification Verified: Yes risk of falls: Secondary Verification Process Yes Signs or symptoms of abuse/neglect since last No Completed: visito Patient Requires Transmission-Based No Hospitalized since last visit: No Precautions: Has Dressing in Place as Prescribed: Yes Patient Has Alerts: No Has Compression in Place as Prescribed: Yes Pain Present Now: No Electronic Signature(s) Signed: 10/30/2016 3:56:24 PM By: Alejandro Mulling Entered By: Alejandro Mulling on 10/30/2016 08:01:27 [name] ([id number]) -------------------------------------------------------------------------------- Encounter Discharge Information Details Patient Name: [name] Date of Service: 10/30/2016 8:00 AM Medical Record Number: [id number] Patient Account Number: [id number] Date of Birth/Sex: 11-Oct-1945 (71 y.o. Male) Treating RN: [name] Primary Care Bryce Kimble: [facility] [place] Other Clinician: Referring Cordia Miklos: [facility] [place] Treating Fynn Vanblarcom/Extender: Altamese Hastings in Treatment: 67 Encounter Discharge Information Items Discharge Pain Level: 0 Discharge  Condition: Stable Ambulatory Status: Ambulatory Discharge Destination: Home Transportation: Private Auto Accompanied By: self Schedule Follow-up Appointment: Yes Medication Reconciliation completed No and provided to Patient/Care Denica Web: Patient Clinical Summary of Care: Declined Electronic Signature(s) Signed: 10/30/2016 8:34:02 AM By: Gwenlyn Perking Entered By: Gwenlyn Perking on 10/30/2016 08:34:02 [name] ([id number]) -------------------------------------------------------------------------------- Lower Extremity Assessment Details Patient Name: [name] Date of Service: 10/30/2016 8:00 AM Medical Record Number: [id number] Patient Account Number: [id number] Date of Birth/Sex: April 17, 1946 (71 y.o. Male) Treating RN: [name] Primary Care Taylynn Easton: [facility] [place] Other Clinician: Referring Fahed Morten: [facility] [place] Treating Marlene Beidler/Extender: [name] Weeks in Treatment: 41 Edema Assessment Assessed: [Left: No] [Right: No] E[Left: dema] [Right: :] Calf Left: Right: Point of Measurement: 40 cm From Medial Instep cm 34.5 cm Ankle Left: Right: Point of Measurement: 12 cm From Medial Instep cm 22.1 cm Vascular Assessment Pulses: Dorsalis Pedis Palpable: [Right:Yes] Posterior Tibial Extremity colors, hair growth, and conditions: Extremity Color: [Right:Normal] Temperature of Extremity: [Right:Warm] Capillary Refill: [Right:< 3 seconds] Electronic Signature(s) Signed: 10/30/2016 3:56:24 PM By: Alejandro Mulling Entered By: Alejandro Mulling on 10/30/2016 08:15:26 [name] ([id number]) -------------------------------------------------------------------------------- Multi Wound Chart Details Patient Name: [name] Date of Service: 10/30/2016 8:00 AM Medical Record Number: [id number] Patient Account Number: [id number] Date of Birth/Sex: September 13, 1945 (71 y.o. Male) Treating RN: [name] Primary Care Heidy Mccubbin: [facility]  [place] Other Clinician: Referring Kambry Takacs: [facility] [place] Treating Jaselyn Nahm/Extender: [name] Weeks in Treatment: 41 Vital Signs Height(in): 76 Pulse(bpm): 80 Weight(lbs): 238 Blood Pressure 148/67 (mmHg): Body Mass Index(BMI): 29 Temperature(F): 98.3 Respiratory Rate 18 (breaths/min): Photos: [1:No Photos] [2:No Photos] [3:No Photos] Wound Location: [1:Right Malleolus - Lateral] [2:Right Malleolus - Medial] [3:Right Lower Leg - Posterior] Wounding Event: [1:Gradually Appeared] [2:Gradually Appeared] [3:Surgical Injury] Primary Etiology: [1:Venous Leg Ulcer] [2:Venous Leg Ulcer] [3:Open Surgical Wound] Comorbid History: [1:Cataracts, Chronic Obstructive Pulmonary Disease (COPD), Osteoarthritis] [2:Cataracts, Chronic Obstructive Pulmonary Disease (COPD), Osteoarthritis] [3:Cataracts, Chronic Obstructive Pulmonary Disease (COPD), Osteoarthritis] Date Acquired: [1:08/11/2015] [2:01/30/2016] [3:10/08/2016] Weeks of Treatment: [1:41] [2:39] [3:3]  Wound Status: [1:Open] [2:Open] [3:Open] Clustered Wound: [1:No] [2:Yes] [3:No] Measurements L x W x D 6x6.4x0.2 [2:3x2.5x0.2] [3:0.2x0.2x0.1] (cm) Area (cm) : [1:30.159] [2:5.89] [3:0.031] Volume (cm) : [1:6.032] [2:1.178] [3:0.003] % Reduction in Area: [1:-74.50%] [2:49.00%] [3:84.20%] % Reduction in Volume: -16.40% [2:-2.00%] [3:98.50%] Classification: [1:Full Thickness Without Exposed Support Structures] [2:Full Thickness Without Exposed Support Structures] [3:Partial Thickness] Exudate Amount: [1:Large] [2:Large] [3:Large] Exudate Type: [1:Serosanguineous] [2:Serosanguineous] [3:Serosanguineous] Exudate Color: [1:red, brown] [2:red, brown] [3:red, brown] Wound Margin: [1:Distinct, outline attached] [2:Distinct, outline attached] [3:Distinct, outline attached] Granulation Amount: [1:Medium (34-66%)] [2:Large (67-100%)] [3:Large (67-100%)] Granulation Quality: [1:Red] [2:Red] [3:Red] Necrotic Amount: [1:Medium (34-66%)]  [2:Small (1-33%)] [3:None Present (0%)] Exposed Structures: [3:N/A] Fascia: No Fascia: No Fat Layer (Subcutaneous Fat Layer (Subcutaneous Tissue) Exposed: No Tissue) Exposed: No Tendon: No Tendon: No Muscle: No Muscle: No Joint: No Joint: No Bone: No Bone: No Limited to Skin Limited to Skin Breakdown Breakdown Epithelialization: Small (1-33%) N/A N/A Periwound Skin Texture: No Abnormalities Noted No Abnormalities Noted No Abnormalities Noted Periwound Skin No Abnormalities Noted Maceration: Yes No Abnormalities Noted Moisture: Periwound Skin Color: No Abnormalities Noted No Abnormalities Noted No Abnormalities Noted Temperature: No Abnormality No Abnormality No Abnormality Tenderness on No Yes No Palpation: Wound Preparation: Ulcer Cleansing: Ulcer Cleansing: Ulcer Cleansing: Rinsed/Irrigated with Rinsed/Irrigated with Rinsed/Irrigated with Saline, Other: soap and Saline, Other: soap and Saline, Other: soap and water water water Topical Anesthetic Topical Anesthetic Topical Anesthetic Applied: Other: lidocaine Applied: Other: lidocaine Applied: Other: lidocaine 4% 4% 4% Treatment Notes Electronic Signature(s) Signed: 10/31/2016 8:02:19 AM By: Baltazar Najjarobson, [name] MD Entered By: Baltazar Najjarobson, [name] on 10/30/2016 08:21:37 [name] StackBLACKWELL, Keatin C. ([id number]) -------------------------------------------------------------------------------- Multi-Disciplinary Care Plan Details Patient Name: [name] StackBLACKWELL, Jayko C. Date of Service: 10/30/2016 8:00 AM Medical Record Number: [id number] Patient Account Number: [id number] Date of Birth/Sex: 29-Jul-1946 80(70 y.o. Male) Treating RN: Phillis HaggisPinkerton, [name] Primary Care Kamran Coker: [place] Other Clinician: Referring Devanta Daniel: [facility] [place] Treating Sayana Salley/Extender: Maxwell CaulOBSON, [name] G Weeks in Treatment: 1041 Active Inactive ` Venous Leg Ulcer Nursing Diagnoses: Knowledge deficit related to disease process and management Goals: Patient will maintain  optimal edema control Date Initiated: 04/03/2016 Target Resolution Date: 11/24/2016 Goal Status: Active Interventions: Compression as ordered Treatment Activities: Therapeutic compression applied : 04/03/2016 Notes: ` Wound/Skin Impairment Nursing Diagnoses: Impaired tissue integrity Goals: Ulcer/skin breakdown will heal within 14 weeks Date Initiated: 01/17/2016 Target Resolution Date: 11/24/2016 Goal Status: Active Interventions: Assess ulceration(s) every visit Notes: Electronic Signature(s) Signed: 10/30/2016 3:56:24 PM By: Aurelio JewPinkerton, Debra Jr, [name] ([id number]) Entered By: Alejandro MullingPinkerton, Debra on 10/30/2016 08:17:11 [name] StackBLACKWELL, Ilan C. ([id number]) -------------------------------------------------------------------------------- Pain Assessment Details Patient Name: [name] StackBLACKWELL, Matthieu C. Date of Service: 10/30/2016 8:00 AM Medical Record Number: [id number] Patient Account Number: [id number] Date of Birth/Sex: 29-Jul-1946 [age] y.o. Male) Treating RN: Phillis HaggisPinkerton, [name] Primary Care Revis Whalin: [place] Other Clinician: Referring Ellyce Lafevers: [facility] [place] Treating Sinclair Arrazola/Extender: Maxwell CaulOBSON, [name] G Weeks in Treatment: 41 Active Problems Location of Pain Severity and Description of Pain Patient Has Paino No Site Locations With Dressing Change: No Pain Management and Medication Current Pain Management: Electronic Signature(s) Signed: 10/30/2016 3:56:24 PM By: Alejandro MullingPinkerton, Debra Entered By: Alejandro MullingPinkerton, Debra on 10/30/2016 08:01:36 [name] StackBLACKWELL, Silverio C. ([id number]) -------------------------------------------------------------------------------- Patient/Caregiver Education Details [name], Hutchinson Date of Service: 10/30/2016 8:00 AM Patient Name: C. Patient Account Number: [id number] Medical Record Treating RN: Phillis Haggisinkerton, [name] [id number] Number: Other Clinician: Date of Birth/Gender: 29-Jul-1946 [age] y.o. Male) Treating Baltazar NajjarOBSON, [name] Primary Care Phys[place] Physician/Extender: G Referring Physician: [facility] [place] Weeks in Treatment: 1441 Education Assessment Education Provided To: Patient Education Topics  Provided Wound/Skin Impairment: Handouts: Other: do not get wrap wet Methods: Demonstration, Explain/Verbal Responses: State content correctly Electronic Signature(s) Signed: 10/30/2016 3:56:24 PM By: Alejandro Mulling Entered By: Alejandro Mulling on 10/30/2016 08:30:48 [name] ([id number]) -------------------------------------------------------------------------------- Wound Assessment Details Patient Name: [name] Date of Service: 10/30/2016 8:00 AM Medical Record Number: [id number] Patient Account Number: [id number] Date of Birth/Sex: November 09, 1945 (71 y.o. Male) Treating RN: [name] Primary Care Romyn Boswell: [facility] [place] Other Clinician: Referring Bluma Buresh: [facility] [place] Treating Glada Wickstrom/Extender: [name] Weeks in Treatment: 41 Wound Status Wound Number: 1 Primary Venous Leg Ulcer Etiology: Wound Location: Right Malleolus - Lateral Wound Open Wounding Event: Gradually Appeared Status: Date Acquired: 08/11/2015 Comorbid Cataracts, Chronic Obstructive Weeks Of Treatment: 41 History: Pulmonary Disease (COPD), Clustered Wound: No Osteoarthritis Photos Photo Uploaded By: Alejandro Mulling on 10/30/2016 08:36:13 Wound Measurements Length: (cm) 6 Width: (cm) 6.4 Depth: (cm) 0.2 Area: (cm) 30.159 Volume: (cm) 6.032 % Reduction in Area: -74.5% % Reduction in Volume: -16.4% Epithelialization: Small (1-33%) Tunneling: No Undermining: No Wound Description Full Thickness Without Exposed Foul Odor Afte Classification: Support Structures Slough/Fibrino Wound Margin: Distinct, outline attached Exudate Large Amount: Exudate Type: Serosanguineous Exudate Color: red, brown r Cleansing: No Yes Wound Bed Granulation Amount: Medium (34-66%) Exposed Structure Granulation Quality:  Red Fascia Exposed: No Johanson, Talal C. ([id number]) Necrotic Amount: Medium (34-66%) Fat Layer (Subcutaneous Tissue) Exposed: No Necrotic Quality: Adherent Slough Tendon Exposed: No Muscle Exposed: No Joint Exposed: No Bone Exposed: No Limited to Skin Breakdown Periwound Skin Texture Texture Color No Abnormalities Noted: No No Abnormalities Noted: No Moisture Temperature / Pain No Abnormalities Noted: No Temperature: No Abnormality Wound Preparation Ulcer Cleansing: Rinsed/Irrigated with Saline, Other: soap and water, Topical Anesthetic Applied: Other: lidocaine 4%, Treatment Notes Wound #1 (Right, Lateral Malleolus) 1. Cleansed with: Clean wound with Normal Saline Cleanse wound with antibacterial soap and water 2. Anesthetic Topical Lidocaine 4% cream to wound bed prior to debridement 4. Dressing Applied: Hydrafera Blue 5. Secondary Dressing Applied ABD Pad 7. Secured with Tape 3 Layer Compression System - Right Lower Extremity Notes drawtex, unna to anchor Electronic Signature(s) Signed: 10/30/2016 3:56:24 PM By: Alejandro Mulling Entered By: Alejandro Mulling on 10/30/2016 08:15:01 [name] ([id number]) -------------------------------------------------------------------------------- Wound Assessment Details Patient Name: [name] Date of Service: 10/30/2016 8:00 AM Medical Record Number: [id number] Patient Account Number: [id number] Date of Birth/Sex: 02-04-1946 (71 y.o. Male) Treating RN: [name] Primary Care Sallee Hogrefe: [facility] [place] Other Clinician: Referring Durinda Buzzelli: [facility] [place] Treating Raechelle Sarti/Extender: [name] Weeks in Treatment: 41 Wound Status Wound Number: 2 Primary Venous Leg Ulcer Etiology: Wound Location: Right Malleolus - Medial Wound Open Wounding Event: Gradually Appeared Status: Date Acquired: 01/30/2016 Comorbid Cataracts, Chronic Obstructive Weeks Of Treatment: 39 History: Pulmonary Disease  (COPD), Clustered Wound: Yes Osteoarthritis Photos Photo Uploaded By: Alejandro Mulling on 10/30/2016 08:36:14 Wound Measurements Length: (cm) 3 Width: (cm) 2.5 Depth: (cm) 0.2 Area: (cm) 5.89 Volume: (cm) 1.178 % Reduction in Area: 49% % Reduction in Volume: -2% Tunneling: No Undermining: No Wound Description Full Thickness Without Exposed Foul Odor Afte Classification: Support Structures Slough/Fibrino Wound Margin: Distinct, outline attached Exudate Large Amount: Exudate Type: Serosanguineous Exudate Color: red, brown r Cleansing: No Yes Wound Bed Granulation Amount: Large (67-100%) Exposed Structure Granulation Quality: Red Fascia Exposed: No Helvie, Lorimer C. ([id number]) Necrotic Amount: Small (1-33%) Fat Layer (Subcutaneous Tissue) Exposed: No Necrotic Quality: Adherent Slough Tendon Exposed: No Muscle Exposed: No Joint Exposed: No Bone Exposed: No Limited to Skin Breakdown Periwound Skin Texture  Texture Color No Abnormalities Noted: No No Abnormalities Noted: No Moisture Temperature / Pain No Abnormalities Noted: No Temperature: No Abnormality Maceration: Yes Tenderness on Palpation: Yes Wound Preparation Ulcer Cleansing: Rinsed/Irrigated with Saline, Other: soap and water, Topical Anesthetic Applied: Other: lidocaine 4%, Treatment Notes Wound #2 (Right, Medial Malleolus) 1. Cleansed with: Clean wound with Normal Saline Cleanse wound with antibacterial soap and water 2. Anesthetic Topical Lidocaine 4% cream to wound bed prior to debridement 4. Dressing Applied: Hydrafera Blue 5. Secondary Dressing Applied ABD Pad 7. Secured with Tape 3 Layer Compression System - Right Lower Extremity Notes drawtex, unna to anchor Electronic Signature(s) Signed: 10/30/2016 3:56:24 PM By: Alejandro Mulling Entered By: Alejandro Mulling on 10/30/2016 08:13:06 [name]  ([id number]) -------------------------------------------------------------------------------- Wound Assessment Details Patient Name: [name] Date of Service: 10/30/2016 8:00 AM Medical Record Number: [id number] Patient Account Number: [id number] Date of Birth/Sex: [birth year] (71 y.o. Male) Treating RN: [name] Primary Care Olufemi Mofield: [facility] [place] Other Clinician: Referring Cade Olberding: [facility] [place] Treating Moxon Messler/Extender: [name] Weeks in Treatment: 41 Wound Status Wound Number: 3 Primary Open Surgical Wound Etiology: Wound Location: Right Lower Leg - Posterior Wound Open Wounding Event: Surgical Injury Status: Date Acquired: 10/08/2016 Comorbid Cataracts, Chronic Obstructive Weeks Of Treatment: 3 History: Pulmonary Disease (COPD), Clustered Wound: No Osteoarthritis Photos Photo Uploaded By: Alejandro Mulling on 10/30/2016 08:36:25 Wound Measurements Length: (cm) 0.2 Width: (cm) 0.2 Depth: (cm) 0.1 Area: (cm) 0.031 Volume: (cm) 0.003 % Reduction in Area: 84.2% % Reduction in Volume: 98.5% Tunneling: No Undermining: No Wound Description Classification: Partial Thickness Foul Odor Af Wound Margin: Distinct, outline attached Slough/Fibri Exudate Amount: Large Exudate Type: Serosanguineous Exudate Color: red, brown ter Cleansing: No no No Wound Bed Granulation Amount: Large (67-100%) Granulation Quality: Red Necrotic Amount: None Present (0%) Marian, Merick C. ([id number]) Periwound Skin Texture Texture Color No Abnormalities Noted: No No Abnormalities Noted: No Moisture Temperature / Pain No Abnormalities Noted: No Temperature: No Abnormality Wound Preparation Ulcer Cleansing: Rinsed/Irrigated with Saline, Other: soap and water, Topical Anesthetic Applied: Other: lidocaine 4%, Treatment Notes Wound #3 (Right, Posterior Lower Leg) 1. Cleansed with: Clean wound with Normal Saline Cleanse wound with antibacterial soap and  water 2. Anesthetic Topical Lidocaine 4% cream to wound bed prior to debridement 4. Dressing Applied: Aquacel Ag 5. Secondary Dressing Applied ABD Pad 7. Secured with Tape 3 Layer Compression System - Right Lower Extremity Notes unna to anchor, drawtex Electronic Signature(s) Signed: 10/30/2016 3:56:24 PM By: Alejandro Mulling Entered By: Alejandro Mulling on 10/30/2016 08:13:49 [name] ([id number]) -------------------------------------------------------------------------------- Vitals Details Patient Name: [name] Date of Service: 10/30/2016 8:00 AM Medical Record Number: [id number] Patient Account Number: [id number] Date of Birth/Sex: [birth year] (71 y.o. Male) Treating RN: [name] Primary Care Erleen Egner: [facility] [place] Other Clinician: Referring Dalphine Cowie: [facility] [place] Treating Rifky Lapre/Extender: [name] Weeks in Treatment: 41 Vital Signs Time Taken: 08:01 Temperature (F): 98.3 Height (in): 76 Pulse (bpm): 80 Weight (lbs): 238 Respiratory Rate (breaths/min): 18 Body Mass Index (BMI): 29 Blood Pressure (mmHg): 148/67 Reference Range: 80 - 120 mg / dl Electronic Signature(s) Signed: 10/30/2016 3:56:24 PM By: Alejandro Mulling Entered By: Alejandro Mulling on 10/30/2016 08:04:07

## 2016-11-02 DIAGNOSIS — I87331 Chronic venous hypertension (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: Secondary | ICD-10-CM | POA: Diagnosis not present

## 2016-11-03 NOTE — Progress Notes (Signed)
[name], Elon C. ([id number]) Visit Report for 11/02/2016 Arrival Information Details Patient Name: [name], [name] Date of Service: 11/02/2016 8:00 AM Medical Record Number: [id number] Patient Account Number: [id number] Date of Birth/Sex: September 05, 1945 [age] y.o. Male) Treating RN: Phillis HaggisPinkerton, [name] Primary Care Sheral Pfahler: [place] Other Clinician: Referring Lenard Kampf: [facility] [place] Treating Maria Coin/Extender: Rudene ReBritto, Errol Weeks in Treatment: 1041 Visit Information History Since Last Visit All ordered tests and consults were completed: No Patient Arrived: Ambulatory Added or deleted any medications: No Arrival Time: 07:59 Any new allergies or adverse reactions: No Accompanied By: self Had a fall or experienced change in No Transfer Assistance: None activities of daily living that may affect Patient Identification Verified: Yes risk of falls: Secondary Verification Process Yes Signs or symptoms of abuse/neglect since last No Completed: visito Patient Requires Transmission-Based No Hospitalized since last visit: No Precautions: Has Dressing in Place as Prescribed: Yes Patient Has Alerts: No Has Compression in Place as Prescribed: Yes Pain Present Now: No Electronic Signature(s) Signed: 11/02/2016 5:07:14 PM By: Alejandro MullingPinkerton, Debra Entered By: Alejandro MullingPinkerton, Debra on 11/02/2016 08:00:12 [name], [name] ([id number]) -------------------------------------------------------------------------------- Encounter Discharge Information Details Patient Name: [name], [name] Date of Service: 11/02/2016 8:00 AM Medical Record Number: [id number] Patient Account Number: [id number] Date of Birth/Sex: September 05, 1945 [age] y.o. Male) Treating RN: Phillis HaggisPinkerton, [name] Primary Care Henrique Parekh: [place] [place] Other Clinician: Referring Elantra Caprara: [facility] [place] Treating Emry Tobin/Extender: Rudene ReBritto, Errol Weeks in Treatment: 5641 Encounter Discharge Information Items Discharge Pain Level: 0 Discharge Condition:  Stable Ambulatory Status: Ambulatory Discharge Destination: Home Private Transportation: Auto Accompanied By: self Schedule Follow-up Appointment: Yes Medication Reconciliation completed and No provided to Patient/Care Tomas Schamp: Clinical Summary of Care: Electronic Signature(s) Signed: 11/02/2016 5:07:14 PM By: Alejandro MullingPinkerton, Debra Entered By: Alejandro MullingPinkerton, Debra on 11/02/2016 08:13:20 [name], [name] ([id number]) -------------------------------------------------------------------------------- Patient/Caregiver Education Details Patient Name: [name], [name] Date of Service: 11/02/2016 8:00 AM Medical Record Number: [id number] Patient Account Number: [id number] Date of Birth/Gender: September 05, 1945 [age] y.o. Male) Treating RN: Phillis HaggisPinkerton, [name] Primary Care Physician: [place] [place] Other Clinician: Referring Physician: [facility] [place] Treating Physician/Extender: Rudene ReBritto, Errol Weeks in Treatment: 7341 Education Assessment Education Provided To: Patient Education Topics Provided Wound/Skin Impairment: Handouts: Other: do not get wrap wet Methods: Demonstration, Explain/Verbal Responses: State content correctly Electronic Signature(s) Signed: 11/02/2016 5:07:14 PM By: Alejandro MullingPinkerton, Debra Entered By: Alejandro MullingPinkerton, Debra on 11/02/2016 08:13:08 [name], [name] ([id number]) -------------------------------------------------------------------------------- Wound Assessment Details Patient Name: [name], [name] Date of Service: 11/02/2016 8:00 AM Medical Record Number: [id number] Patient Account Number: [id number] Date of Birth/Sex: September 05, 1945 [age] y.o. Male) Treating RN: Phillis HaggisPinkerton, [name] Primary Care Ismeal Heider: [facility] [place] Other Clinician: Referring Zipporah Finamore: [facility] [place] Treating Horacio Werth/Extender: Rudene ReBritto, Errol Weeks in Treatment: 41 Wound Status Wound Number: 1 Primary Venous Leg Ulcer Etiology: Wound Location: Right Malleolus - Lateral Wound Open Wounding Event: Gradually  Appeared Status: Date Acquired: 08/11/2015 Comorbid Cataracts, Chronic Obstructive Weeks Of Treatment: 41 History: Pulmonary Disease (COPD), Clustered Wound: No Osteoarthritis Wound Measurements Length: (cm) 6 Width: (cm) 6.4 Depth: (cm) 0.2 Area: (cm) 30.159 Volume: (cm) 6.032 % Reduction in Area: -74.5% % Reduction in Volume: -16.4% Epithelialization: Small (1-33%) Tunneling: No Undermining: No Wound Description Full Thickness Without Exposed Classification: Support Structures Wound Margin: Distinct, outline attached Exudate Large Amount: Exudate Type: Serosanguineous Exudate Color: red, brown Foul Odor After Cleansing: No Slough/Fibrino Yes Wound Bed Granulation Amount: Medium (34-66%) Exposed Structure Granulation Quality: Red Fascia Exposed: No Necrotic Amount: Medium (34-66%) Fat Layer (Subcutaneous Tissue) Exposed: No Necrotic Quality: Adherent Slough Tendon Exposed: No Muscle Exposed: No Joint Exposed: No Bone Exposed:  No Limited to Skin Breakdown Periwound Skin Texture Texture Color No Abnormalities Noted: No No Abnormalities Noted: No Moisture Temperature / Pain Steward, Emeterio C. ([id number]) No Abnormalities Noted: No Temperature: No Abnormality Wound Preparation Ulcer Cleansing: Rinsed/Irrigated with Saline, Other: soap and water, Topical Anesthetic Applied: None, Other: lidocaine 4%, Treatment Notes Wound #1 (Right, Lateral Malleolus) 1. Cleansed with: Clean wound with Normal Saline Cleanse wound with antibacterial soap and water 4. Dressing Applied: Hydrafera Blue 5. Secondary Dressing Applied ABD Pad 7. Secured with Tape 3 Layer Compression System - Bilateral Notes unna to anchor, drawtex Electronic Signature(s) Signed: 11/02/2016 5:07:14 PM By: Alejandro Mulling Entered By: Alejandro Mulling on 11/02/2016 08:00:50 [name]  ([id number]) -------------------------------------------------------------------------------- Wound Assessment Details Patient Name: [name] Date of Service: 11/02/2016 8:00 AM Medical Record Number: [id number] Patient Account Number: [id number] Date of Birth/Sex: [birth year] (71 y.o. Male) Treating RN: [name] Primary Care Aslyn Cottman: [facility] [place] Other Clinician: Referring Devota Viruet: [facility] [place] Treating Brad Mcgaughy/Extender: Rudene Re in Treatment: 41 Wound Status Wound Number: 2 Primary Venous Leg Ulcer Etiology: Wound Location: Right Malleolus - Medial Wound Open Wounding Event: Gradually Appeared Status: Date Acquired: 01/30/2016 Comorbid Cataracts, Chronic Obstructive Weeks Of Treatment: 39 History: Pulmonary Disease (COPD), Clustered Wound: Yes Osteoarthritis Wound Measurements Length: (cm) 3 Width: (cm) 2.5 Depth: (cm) 0.2 Area: (cm) 5.89 Volume: (cm) 1.178 % Reduction in Area: 49% % Reduction in Volume: -2% Epithelialization: None Tunneling: No Undermining: No Wound Description Full Thickness Without Exposed Classification: Support Structures Wound Margin: Distinct, outline attached Exudate Large Amount: Exudate Type: Serosanguineous Exudate Color: red, brown Foul Odor After Cleansing: No Slough/Fibrino Yes Wound Bed Granulation Amount: Large (67-100%) Exposed Structure Granulation Quality: Red Fascia Exposed: No Necrotic Amount: Small (1-33%) Fat Layer (Subcutaneous Tissue) Exposed: No Necrotic Quality: Adherent Slough Tendon Exposed: No Muscle Exposed: No Joint Exposed: No Bone Exposed: No Limited to Skin Breakdown Periwound Skin Texture Texture Color No Abnormalities Noted: No No Abnormalities Noted: No Moisture Temperature / Pain Bielinski, Tonya C. ([id number]) No Abnormalities Noted: No Temperature: No Abnormality Maceration: Yes Tenderness on Palpation: Yes Wound Preparation Ulcer  Cleansing: Rinsed/Irrigated with Saline, Other: soap and water, Topical Anesthetic Applied: None, Other: lidocaine 4%, Treatment Notes Wound #2 (Right, Medial Malleolus) 1. Cleansed with: Clean wound with Normal Saline Cleanse wound with antibacterial soap and water 4. Dressing Applied: Hydrafera Blue 5. Secondary Dressing Applied ABD Pad 7. Secured with Tape 3 Layer Compression System - Bilateral Notes unna to anchor, drawtex Electronic Signature(s) Signed: 11/02/2016 5:07:14 PM By: Alejandro Mulling Entered By: Alejandro Mulling on 11/02/2016 08:01:04 [name] ([id number]) -------------------------------------------------------------------------------- Wound Assessment Details Patient Name: [name] Date of Service: 11/02/2016 8:00 AM Medical Record Number: [id number] Patient Account Number: [id number] Date of Birth/Sex: [birth year] (71 y.o. Male) Treating RN: [name] Primary Care Sheala Dosh: [facility] [place] Other Clinician: Referring Taquana Bartley: [facility] [place] Treating Bodhi Moradi/Extender: Rudene Re in Treatment: 41 Wound Status Wound Number: 3 Primary Open Surgical Wound Etiology: Wound Location: Right Lower Leg - Posterior Wound Open Wounding Event: Surgical Injury Status: Date Acquired: 10/08/2016 Comorbid Cataracts, Chronic Obstructive Weeks Of Treatment: 3 History: Pulmonary Disease (COPD), Clustered Wound: No Osteoarthritis Wound Measurements Length: (cm) 0.2 Width: (cm) 0.2 Depth: (cm) 0.1 Area: (cm) 0.031 Volume: (cm) 0.003 % Reduction in Area: 84.2% % Reduction in Volume: 98.5% Epithelialization: None Tunneling: No Undermining: No Wound Description Classification: Partial Thickness Wound Margin: Distinct, outline attached Exudate Amount: Large Exudate Type: Serosanguineous Exudate Color: red, brown Foul Odor After Cleansing: No Slough/Fibrino No  Wound Bed Granulation Amount: Large (67-100%) Granulation Quality:  Red Necrotic Amount: None Present (0%) Periwound Skin Texture Texture Color No Abnormalities Noted: No No Abnormalities Noted: No Moisture Temperature / Pain No Abnormalities Noted: No Temperature: No Abnormality Wound Preparation Ulcer Cleansing: Rinsed/Irrigated with Saline, Other: soap and water, Topical Anesthetic Applied: None, Other: lidocaine 4%, Treatment Notes Schirtzinger, [name] Wound #3 (Right, Posterior Lower Leg) 1. Cleansed with: Clean wound with Normal Saline Cleanse wound with antibacterial soap and water 4. Dressing Applied: Aquacel Ag 5. Secondary Dressing Applied ABD Pad 7. Secured with Tape 3 Layer Compression System - Bilateral Notes unna to anchor, drawtex Electronic Signature(s) Signed: 11/02/2016 5:07:14 PM By: Alejandro Mulling Entered By: Alejandro Mulling on 11/02/2016 08:01:19

## 2016-11-06 ENCOUNTER — Encounter: Payer: Medicare HMO | Admitting: Internal Medicine

## 2016-11-06 DIAGNOSIS — I87331 Chronic venous hypertension (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: Secondary | ICD-10-CM | POA: Diagnosis not present

## 2016-11-07 NOTE — Progress Notes (Signed)
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 down especially in the medial right leg wound. Area on the lateral which is more substantial also looks better still requires debridement 05/22/16; we have been using Hydrofera Blue. 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 Currently he tells me that the pain is doing very well about a 1 out of 10 today. Yesterday was a little bit worse but he tells me that he was more active watering his flowers that day. Overall he feels that his symptoms are doing significantly better at this point in time. His edema continues to be controlled well with the 4-layer compression wrap and he really has not noted any odor at this point in time. He is tolerating the dressing changes when they are performed well. 06/05/16 at this point in time today patient currently shows no interval signs or symptoms of local or systemic infection. Again his pain level he rates to be a 1 out of 10  at most and overall he tells me that generally this is not giving him much trouble. In fact he even feels maybe a little bit better than last week. We have continue with the 4-layer compression wrap in which she tolerates very well at this point. He is continuing to utilize the [place]. 06/12/16 I think there has been some progression in the status of both of these wounds over today again covered in a gelatinous surface. Has been using Hydrofera Blue. We had used Iodoflex in the past I'm not sure if there was an issue other than changing to something that might progress towards closure faster 06/19/16; he did not tolerate the Flexeril last week secondary to pain and this was changed on Friday back to [facility] area he continues to have copious amounts of gelatinous surface slough which is think inhibiting the speed of healing this area 06/26/16 patient over the last week  has utilized the Santyl to try to loosen up some of the tightly adherent slough that was noted on evaluation last week. The good news is he tells me that the medial malleoli region really does not bother him the right llateral malleoli region is more tender to palpation at this point in time especially in the central/inferior location. However it does appear that the Santyl has done his job to loosen up the adherent slough at this point in time. Fortunately he has no interval signs or symptoms of infection locally or systemically no purulent discharge noted. 07/03/16 at this point in time today patient's wounds appear to be significantly improved over the right medial and lateral malleolus locations. He has much less tenderness at this point in time and the wounds appear clean her although there is still adherent slough this is sufficiently improved over what I saw last week. I still see no evidence of local infection. 07/10/16; continued gradual improvement in the right medial and lateral malleolus locations.  The lateral is more substantial wound now divided into 2 by a rim of normal epithelialization. 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 He denies any complaints of pain or intolerance to compression. Both ulcers are smaller in size oriented today's measurements, both are covered with a softly adherent slough. 07/24/16; medial wound is smaller, lateral about the same although surface looks better. Still using [name], Gaudencio C. ([id number]) Hydrofera Blue 07/31/16; arrives today complaining of pain in the lateral part of his foot. Nurse reports a lot more drainage. He has been using Hydrofera Blue. Switch to silver alginate today 08/03/2016 -- I was asked to see the patient was here for a nurse visit today. I understand he had a lot of pain in his right lower extremity and was having blisters on his right foot which have not been there before. Though he started on doxycycline he does not have blisters elsewhere on his body. I do not believe this is a drug allergy. also mentioned that there was a copious purulent discharge from the wound and clinically there is no evidence of cellulitis. 08/07/16; I note that the patient came in for his nurse check on Friday apparently with blisters on his toes on the right than a lot of swelling in his forefoot. He continued on the doxycycline that I had prescribed on 12/8. A culture was done of the lateral wound that showed a combination of a few Proteus and Pseudomonas. Doxycycline might of covered the Proteus but would be unlikely to cover the Pseudomonas. He is on Coumadin. He arrives in the clinic today feeling a lot better states the pain is a lot better but nothing specific really was done other than to rewrap the foot also noted that he had arterial studies ordered in August although these were never done. It is reasonable to go  ahead and reorder these. 08/14/16; generally arrives in a better state today in terms of the wounds he has taken cefdinir for one week. Our intake nurse reports copious amounts of drainage but the patient is complaining of much less pain. He is not had his PT and INR checked and I've asked him to do this today or tomorrow. 08/24/2016 -- patient arrives today after 10 days and said he had a stomach upset. His arterial study was done and I have reviewed this report and find it to be within normal limits. However  I did not note any venous duplex studies for reflux, and Dr. Leanord Hawking may have ordered these in the past but I will leave it to him to decide if he needs these. The patient has finished his course of cefdinir. 08/28/16; patient arrives today again with copious amounts of thick really green drainage for our intake nurse. He states he has a very tender spot at the superior part of the lateral wound. Wounds are larger 09/04/16; no real change in the condition of this patient's wound still copious amounts of surface slough. Started him on Iodoflex last week he is completing another course of Cefdinir or which I think was done empirically. His arterial study showed ABIs were 1.1 on the right 1.5 on the left. He did have a slightly reduced ABI in the right the left one was not obtained. Had calcification of the right posterior tibial artery. The interpretation was no segmental stenosis. His waveforms were triphasic. His reflux studies are later this month. Depending on this I'll send him for a vascular consultation, he may need to see plastic surgery as I believe he is had plastic surgery on this foot in the past. He had an injury to the foot in the 1980s. 1/16 /18 right lateral greater than right medial ankle wounds on the right in the setting of previous skin grafting. 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 He does  not have arterial issues. Each week he comes in with the same adherent surface slough although there was less of this today 09/18/16; right lateral greater than right medial lower extremity wounds in the setting of previous skin grafting and trauma. He has least to vein laser ablation scheduled for February 2 for venous reflux. He does not have significant arterial disease. Problem has been very difficult to handle surface slough/necrotic tissue. Recently using Iodoflex for this with some, albeit slow improvement 09/25/16; right lateral greater than right medial lower extremity venous wounds in the setting of previous skin grafting. He is going for ablation surgery on February 2 after this he'll come back here for rewrap. He has been using Iodoflex as the primary dressing. 10/02/16; right lateral greater than right medial lower extremity wounds in the setting of previous skin grafting. He had his ablation surgery last week, I don't have a report. He tolerated this well. Came in with a thigh- high Unna boots on Friday. We have been using Iodoflex as the primary dressing. His measurements are improving 10/09/16; continues to make nice aggressive in terms of the wounds on his lateral and medial right ankle in the setting of previous skin grafting. Yesterday he noticed drainage at one of his surgical sites from his KORION, CUEVAS. ([id number]) venous ablation on the right calf. He took off the bandage over this area felt a "popping" sensation and a reddish-brown drainage. He is not complaining of any pain 10/16/16; he continues to make nice progression in terms of the wounds on the lateral and medial malleolus. Both smaller using Iodoflex. He had a surgical area in his posterior mid calf we have been using iodoform. All the wounds are down and dimensions 10/23/16; the patient arrives today with no complaints. He states the Iodoflex is a bit uncomfortable. He is not systemically unwell. We have been  using Iodoflex to the lateral right ankle and the medial and Aquacel Ag to the reflux surgical wound on the posterior  right calf. All of these wounds are doing well 10/30/16; patient states he has no pain no systemic symptoms. I changed him to [facility] last week. Although the wounds are doing well 11/06/16; patient reports no pain or systemic symptoms. We continue with Hydrofera Blue. Both wound areas on the medial and lateral ankle appear to be doing well with improvement and dimensions and improvement in the wound bed Objective Constitutional Vitals Time Taken: 1:30 PM, Height: 76 in, Weight: 238 lbs, BMI: 29, Temperature: 98.1 F, Pulse: 100 bpm, Respiratory Rate: 18 breaths/min, Blood Pressure: 122/85 mmHg. Integumentary (Hair, Skin) Wound #1 status is Open. Original cause of wound was Gradually Appeared. The wound is located on the Right,Lateral Malleolus. The wound measures 5.5cm length x 6.2cm width x 0.2cm depth; 26.782cm^2 area and 5.356cm^3 volume. The wound is limited to skin breakdown. There is no tunneling or undermining noted. There is a large amount of serosanguineous drainage noted. The wound margin is distinct with the outline attached to the wound base. There is medium (34-66%) red granulation within the wound bed. There is a medium (34-66%) amount of necrotic tissue within the wound bed including Adherent Slough. Periwound temperature was noted as No Abnormality. Wound #2 status is Open. Original cause of wound was Gradually Appeared. The wound is located on the Right,Medial Malleolus. The wound measures 3cm length x 1.7cm width x 0.2cm depth; 4.006cm^2 area and 0.801cm^3 volume. The wound is limited to skin breakdown. There is no tunneling or undermining noted. There is a large amount of serosanguineous drainage noted. The wound margin is distinct with the outline attached to the wound base. There is large (67-100%) red granulation within the wound bed. There is a  small (1-33%) amount of necrotic tissue within the wound bed including Adherent Slough. The periwound skin appearance exhibited: Maceration. Periwound temperature was noted as No Abnormality. The periwound has tenderness on palpation. Wound #3 status is Healed - Epithelialized. Original cause of wound was Surgical Injury. The wound is located on the Right,Posterior Lower Leg. The wound measures 0cm length x 0cm width x 0cm depth; 0cm^2 area and 0cm^3 volume. 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 No tub bath. Wound #2 Right,Medial Malleolus: Cleanse wound with mild soap and water May shower with protection. No tub bath. Anesthetic: Wound #1 Right,Lateral Malleolus: Topical Lidocaine 4% cream applied to wound bed prior to debridement Wound #2 Right,Medial Malleolus: Topical Lidocaine 4% cream applied to wound bed prior to debridement Skin Barriers/Peri-Wound Care: Wound #1 Right,Lateral Malleolus: Barrier cream - zinc oxide Triamcinolone Acetonide Ointment Primary Wound Dressing: Wound #1 Right,Lateral Malleolus: Hydrafera Blue Wound #2 Right,Medial Malleolus: Hydrafera Blue Secondary Dressing: Wound #1 Right,Lateral Malleolus: ABD pad Drawtex Wound #2 Right,Medial  Malleolus: Dobie, Jacksen C. ([id number]) ABD pad Drawtex Dressing Change Frequency: Wound #1 Right,Lateral Malleolus: Change dressing every week Other: - nurse visit Friday Wound #2 Right,Medial Malleolus: Change dressing every week Other: - nurse visit Friday Follow-up  Appointments: Wound #1 Right,Lateral Malleolus: Return Appointment in 1 week. Other: - nurse visit Friday Wound #2 Right,Medial Malleolus: Return Appointment in 1 week. Other: - nurse visit Friday Edema Control: Wound #1 Right,Lateral Malleolus: 3 Layer Compression System - Right Lower Extremity - UNNA TO ANCHOR Elevate legs to the level of the heart and pump ankles as often as possible Wound #2 Right,Medial Malleolus: 3 Layer Compression System - Right Lower Extremity - UNNA TO ANCHOR Elevate legs to the level of the heart and pump ankles as often as possible Additional Orders / Instructions: Wound #1 Right,Lateral Malleolus: Increase protein intake. OK to return to work with the following restrictions: Activity as tolerated Wound #2 Right,Medial Malleolus: Increase protein intake. OK to return to work with the following restrictions: Activity as tolerated #1 continue with Hydrofera Blue to both wound areas #23 layer compression Electronic Signature(s) Signed: 11/07/2016 7:54:47 AM By: [name] MD Entered By: [name] on 11/06/2016 14:08:05 [name] ([id number]) JOBIE, POPP ([id number]) -------------------------------------------------------------------------------- SuperBill Details [name] Date of Service: 11/06/2016 Patient Name: C. Patient Account Number: [id number] Medical Record Treating RN: [name] [id number] Number: Other Clinician: Date of Birth/Sex: 1946/08/21 (70 y.o. 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, initial T85.613A encounter Disruption of external operation (surgical) wound, not elsewhere classified, subsequent T81.31XD encounter L97.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 of skin Facility Procedures CPT4: Description Modifier Quantity Code [id number] (Facility Use Only) [id number] - APPLY MULTLAY COMPRS LWR RT 1 LEG Physician Procedures CPT4: Description Modifier Quantity Code [id number] 10272 - WC PHYS LEVEL 2 - EST PT 1 ICD-10 Description Diagnosis I87.331 Chronic venous hypertension (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

## 2016-11-07 NOTE — Progress Notes (Signed)
[name] ([id number]) Visit Report for 11/06/2016 Arrival Information Details Patient Name: [name], [name]. Date of Service: 11/06/2016 1:30 PM Medical Record Number: [id number] Patient Account Number: [id number] Date of Birth/Sex: [birth year] (71 y.o. Male) Treating RN: [name] Primary Care Maximos Zayas: [facility] [place] Other Clinician: Referring Alfonsa Vaile: [facility] [place] Treating Zuha Dejonge/Extender: Altamese [place] in Treatment: 42 Visit Information History Since Last Visit All ordered tests and consults were completed: No Patient Arrived: Ambulatory Added or deleted any medications: No Arrival Time: 13:29 Any new allergies or adverse reactions: No Accompanied By: self Had a fall or experienced change in No Transfer Assistance: None activities of daily living that may affect Patient Identification Verified: Yes risk of falls: Secondary Verification Process Yes Signs or symptoms of abuse/neglect since last No Completed: visito Patient Requires Transmission-Based No Hospitalized since last visit: No Precautions: Has Dressing in Place as Prescribed: Yes Patient Has Alerts: No Has Compression in Place as Prescribed: Yes Pain Present Now: No Electronic Signature(s) Signed: 11/06/2016 5:20:09 PM By: Alejandro Mulling Entered By: Alejandro Mulling on 11/06/2016 13:30:17 [name] ([id number]) -------------------------------------------------------------------------------- Encounter Discharge Information Details Patient Name: [name] Date of Service: 11/06/2016 1:30 PM Medical Record Number: [id number] Patient Account Number: [id number] Date of Birth/Sex: [birth year] (71 y.o. Male) Treating RN: [name] Primary Care Fermon Ureta: [facility] [place] Other Clinician: Referring Davide Risdon: [facility] [place] Treating Coreyon Nicotra/Extender: Altamese [place] in Treatment: 38 Encounter Discharge Information Items Discharge Pain Level: 0 Discharge  Condition: Stable Ambulatory Status: Ambulatory Discharge Destination: Home Private Transportation: Auto Accompanied By: self Schedule Follow-up Appointment: Yes Medication Reconciliation completed and No provided to Patient/Care Egan Sahlin: Clinical Summary of Care: Electronic Signature(s) Signed: 11/06/2016 5:20:09 PM By: Alejandro Mulling Entered By: Alejandro Mulling on 11/06/2016 13:43:36 [name] ([id number]) -------------------------------------------------------------------------------- Lower Extremity Assessment Details Patient Name: [name] Date of Service: 11/06/2016 1:30 PM Medical Record Number: [id number] Patient Account Number: [id number] Date of Birth/Sex: 09-Apr-1946 (71 y.o. Male) Treating RN: [name] Primary Care Angelle Isais: [facility] [place] Other Clinician: Referring [place] Gamero: [facility] [place] Treating Katherina Wimer/Extender: [name] Weeks in Treatment: 42 Edema Assessment Assessed: [Left: No] [Right: No] E[Left: dema] [Right: :] Calf Left: Right: Point of Measurement: 40 cm From Medial Instep cm 34.5 cm Ankle Left: Right: Point of Measurement: 12 cm From Medial Instep cm 22.1 cm Vascular Assessment Pulses: Dorsalis Pedis Palpable: [Right:Yes] Posterior Tibial Extremity colors, hair growth, and conditions: Extremity Color: [Right:Normal] Temperature of Extremity: [Right:Warm] Capillary Refill: [Right:< 3 seconds] Electronic Signature(s) Signed: 11/06/2016 5:20:09 PM By: Alejandro Mulling Entered By: Alejandro Mulling on 11/06/2016 13:41:52 [name] ([id number]) -------------------------------------------------------------------------------- Multi Wound Chart Details Patient Name: [name] Date of Service: 11/06/2016 1:30 PM Medical Record Number: [id number] Patient Account Number: [id number] Date of Birth/Sex: [birth year] (72 y.o. Male) Treating RN: [name] Primary Care Truth Wolaver: [facility]  [place] Other Clinician: Referring Weslyn Holsonback: [facility] [place] Treating Doak Mah/Extender: [name] Weeks in Treatment: 42 Vital Signs Height(in): 76 Pulse(bpm): 100 Weight(lbs): 238 Blood Pressure 122/85 (mmHg): Body Mass Index(BMI): 29 Temperature(F): 98.1 Respiratory Rate 18 (breaths/min): Photos: [1:No Photos] [2:No Photos] [3:No Photos] Wound Location: [1:Right Malleolus - Lateral] [2:Right Malleolus - Medial] [3:Right, Posterior Lower Leg] Wounding Event: [1:Gradually Appeared] [2:Gradually Appeared] [3:Surgical Injury] Primary Etiology: [1:Venous Leg Ulcer] [2:Venous Leg Ulcer] [3:Open Surgical Wound] Comorbid History: [1:Cataracts, Chronic Obstructive Pulmonary Disease (COPD), Osteoarthritis] [2:Cataracts, Chronic Obstructive Pulmonary Disease (COPD), Osteoarthritis] [3:N/A] Date Acquired: [1:08/11/2015] [2:01/30/2016] [3:10/08/2016] Weeks of Treatment: [1:42] [2:40] [3:4] Wound Status: [1:Open] [2:Open] [3:Healed - Epithelialized] Clustered Wound: [  1:No] [2:Yes] [3:No] Measurements L x W x D 5.5x6.2x0.2 [2:3x1.7x0.2] [3:0x0x0] (cm) Area (cm) : [1:26.782] [2:4.006] [3:0] Volume (cm) : [1:5.356] [2:0.801] [3:0] % Reduction in Area: [1:-55.00%] [2:65.30%] [3:100.00%] % Reduction in Volume: -3.30% [2:30.60%] [3:100.00%] Classification: [1:Full Thickness Without Exposed Support Structures] [2:Full Thickness Without Exposed Support Structures] [3:Partial Thickness] Exudate Amount: [1:Large] [2:Large] [3:N/A] Exudate Type: [1:Serosanguineous] [2:Serosanguineous] [3:N/A] Exudate Color: [1:red, brown] [2:red, brown] [3:N/A] Wound Margin: [1:Distinct, outline attached] [2:Distinct, outline attached] [3:N/A] Granulation Amount: [1:Medium (34-66%)] [2:Large (67-100%)] [3:N/A] Granulation Quality: [1:Red] [2:Red] [3:N/A] Necrotic Amount: [1:Medium (34-66%)] [2:Small (1-33%)] [3:N/A] Exposed Structures: [3:N/A] Fascia: No Fascia: No Fat Layer (Subcutaneous Fat Layer  (Subcutaneous Tissue) Exposed: No Tissue) Exposed: No Tendon: No Tendon: No Muscle: No Muscle: No Joint: No Joint: No Bone: No Bone: No Limited to Skin Limited to Skin Breakdown Breakdown Epithelialization: Small (1-33%) Small (1-33%) N/A Periwound Skin Texture: No Abnormalities Noted No Abnormalities Noted No Abnormalities Noted Periwound Skin No Abnormalities Noted Maceration: Yes No Abnormalities Noted Moisture: Periwound Skin Color: No Abnormalities Noted No Abnormalities Noted No Abnormalities Noted Temperature: No Abnormality No Abnormality N/A Tenderness on No Yes No Palpation: Wound Preparation: Ulcer Cleansing: Ulcer Cleansing: N/A Rinsed/Irrigated with Rinsed/Irrigated with Saline, Other: soap and Saline, Other: soap and water water Topical Anesthetic Topical Anesthetic Applied: None, Other: Applied: None, Other: lidocaine 4% lidocaine 4% Treatment Notes Wound #1 (Right, Lateral Malleolus) 1. Cleansed with: Clean wound with Normal Saline Cleanse wound with antibacterial soap and water 4. Dressing Applied: Hydrafera Blue 5. Secondary Dressing Applied ABD Pad 7. Secured with Tape 3 Layer Compression System - Right Lower Extremity Notes drawtex, unna to anchor, TCA Wound #2 (Right, Medial Malleolus) 1. Cleansed with: Clean wound with Normal Saline Cleanse wound with antibacterial soap and water 4. Dressing Applied: Hydrafera Blue 5. Secondary Dressing Applied TYMERE, DEPUY C. ([id number]) ABD Pad 7. Secured with Tape 3 Layer Compression System - Right Lower Extremity Notes drawtex, unna to anchor, TCA Electronic Signature(s) Signed: 11/07/2016 7:54:47 AM By: [name] MD Entered By: [name] on 11/06/2016 14:05:48 [name] ([id number]) -------------------------------------------------------------------------------- Multi-Disciplinary Care Plan Details Patient Name: [name] Date of Service: 11/06/2016 1:30  PM Medical Record Number: [id number] Patient Account Number: [id number] Date of Birth/Sex: 04/08/1946 (71 y.o. Male) Treating RN: [name] Primary Care Symantha Steeber: [facility] [place] Other Clinician: Referring Deangela Randleman: [facility] [place] Treating Jaedyn Marrufo/Extender: [name] Weeks in Treatment: 42 Active Inactive ` Venous Leg Ulcer Nursing Diagnoses: Knowledge deficit related to disease process and management Goals: Patient will maintain optimal edema control Date Initiated: 04/03/2016 Target Resolution Date: 11/24/2016 Goal Status: Active Interventions: Compression as ordered Treatment Activities: Therapeutic compression applied : 04/03/2016 Notes: ` Wound/Skin Impairment Nursing Diagnoses: Impaired tissue integrity Goals: Ulcer/skin breakdown will heal within 14 weeks Date Initiated: 01/17/2016 Target Resolution Date: 11/24/2016 Goal Status: Active Interventions: Assess ulceration(s) every visit Notes: Electronic Signature(s) Signed: 11/06/2016 5:20:09 PM By: Aurelio Jew, [name] ([id number]) Entered By: Alejandro Mulling on 11/06/2016 13:44:00 [name] ([id number]) -------------------------------------------------------------------------------- Pain Assessment Details Patient Name: [name] Date of Service: 11/06/2016 1:30 PM Medical Record Number: [id number] Patient Account Number: [id number] Date of Birth/Sex: 1945/10/09 (71 y.o. Male) Treating RN: [name] Primary Care Laurieanne Galloway: [facility] [place] Other Clinician: Referring Morrison Mcbryar: [facility] [place] Treating Levander Katzenstein/Extender: [name] Weeks in Treatment: 42 Active Problems Location of Pain Severity and Description of Pain Patient Has Paino No Site Locations With Dressing Change: No Pain Management and Medication Current Pain Management: Electronic Signature(s) Signed: 11/06/2016 5:20:09 PM  By: Alejandro Mulling Entered By: Alejandro Mulling on 11/06/2016  13:30:26 [name] ([id number]) -------------------------------------------------------------------------------- Patient/Caregiver Education Details [name] Date of Service: 11/06/2016 1:30 PM Patient Name: C. Patient Account Number: [id number] Medical Record Treating RN: [name] [id number] Number: Other Clinician: Date of Birth/Gender: [birth year] (70 y.o. Male) Treating [name] Primary Care Physician: [facility], [place] Physician/Extender: G Referring Physician: [facility] [place] Weeks in Treatment: 65 Education Assessment Education Provided To: Patient Education Topics Provided Wound/Skin Impairment: Handouts: Other: do not get wrap wet Methods: Demonstration, Explain/Verbal Responses: State content correctly Electronic Signature(s) Signed: 11/06/2016 5:20:09 PM By: Alejandro Mulling Entered By: Alejandro Mulling on 11/06/2016 13:43:49 [name] ([id number]) -------------------------------------------------------------------------------- Wound Assessment Details Patient Name: [name] Date of Service: 11/06/2016 1:30 PM Medical Record Number: [id number] Patient Account Number: [id number] Date of Birth/Sex: [birth year] (71 y.o. Male) Treating RN: [name] Primary Care Josilyn Shippee: [facility] [place] Other Clinician: Referring Grayce Budden: [facility] [place] Treating Jasmynn Pfalzgraf/Extender: [name] Weeks in Treatment: 42 Wound Status Wound Number: 1 Primary Venous Leg Ulcer Etiology: Wound Location: Right Malleolus - Lateral Wound Open Wounding Event: Gradually Appeared Status: Date Acquired: 08/11/2015 Comorbid Cataracts, Chronic Obstructive Weeks Of Treatment: 42 History: Pulmonary Disease (COPD), Clustered Wound: No Osteoarthritis Photos Photo Uploaded By: Alejandro Mulling on 11/06/2016 14:32:25 Wound Measurements Length: (cm) 5.5 Width: (cm) 6.2 Depth: (cm) 0.2 Area: (cm) 26.782 Volume: (cm) 5.356 % Reduction in  Area: -55% % Reduction in Volume: -3.3% Epithelialization: Small (1-33%) Tunneling: No Undermining: No Wound Description Full Thickness Without Exposed Foul Odor Afte Classification: Support Structures Slough/Fibrino Wound Margin: Distinct, outline attached Exudate Large Amount: Exudate Type: Serosanguineous Exudate Color: red, brown r Cleansing: No Yes Wound Bed Granulation Amount: Medium (34-66%) Exposed Structure Granulation Quality: Red Fascia Exposed: No Childrey, Winford C. ([id number]) Necrotic Amount: Medium (34-66%) Fat Layer (Subcutaneous Tissue) Exposed: No Necrotic Quality: Adherent Slough Tendon Exposed: No Muscle Exposed: No Joint Exposed: No Bone Exposed: No Limited to Skin Breakdown Periwound Skin Texture Texture Color No Abnormalities Noted: No No Abnormalities Noted: No Moisture Temperature / Pain No Abnormalities Noted: No Temperature: No Abnormality Wound Preparation Ulcer Cleansing: Rinsed/Irrigated with Saline, Other: soap and water, Topical Anesthetic Applied: None, Other: lidocaine 4%, Treatment Notes Wound #1 (Right, Lateral Malleolus) 1. Cleansed with: Clean wound with Normal Saline Cleanse wound with antibacterial soap and water 4. Dressing Applied: Hydrafera Blue 5. Secondary Dressing Applied ABD Pad 7. Secured with Tape 3 Layer Compression System - Right Lower Extremity Notes drawtex, unna to anchor, TCA Electronic Signature(s) Signed: 11/06/2016 5:20:09 PM By: Alejandro Mulling Entered By: Alejandro Mulling on 11/06/2016 13:39:47 [name] ([id number]) -------------------------------------------------------------------------------- Wound Assessment Details Patient Name: [name] Date of Service: 11/06/2016 1:30 PM Medical Record Number: [id number] Patient Account Number: [id number] Date of Birth/Sex: Jun 09, 1946 (71 y.o. Male) Treating RN: [name] Primary Care Irini Leet: [facility] [place] Other  Clinician: Referring Chania Kochanski: [facility] [place] Treating Brittanee Ghazarian/Extender: [name] Weeks in Treatment: 42 Wound Status Wound Number: 2 Primary Venous Leg Ulcer Etiology: Wound Location: Right Malleolus - Medial Wound Open Wounding Event: Gradually Appeared Status: Date Acquired: 01/30/2016 Comorbid Cataracts, Chronic Obstructive Weeks Of Treatment: 40 History: Pulmonary Disease (COPD), Clustered Wound: Yes Osteoarthritis Photos Photo Uploaded By: Alejandro Mulling on 11/06/2016 14:32:48 Wound Measurements Length: (cm) 3 Width: (cm) 1.7 Depth: (cm) 0.2 Area: (cm) 4.006 Volume: (cm) 0.801 % Reduction in Area: 65.3% % Reduction in Volume: 30.6% Epithelialization: Small (1-33%) Tunneling: No Undermining: No Wound Description Full Thickness Without Exposed Foul Odor Afte Classification: Support Structures Slough/Fibrino  Wound Margin: Distinct, outline attached Exudate Large Amount: Exudate Type: Serosanguineous Exudate Color: red, brown r Cleansing: No Yes Wound Bed Granulation Amount: Large (67-100%) Exposed Structure Granulation Quality: Red Fascia Exposed: No Diantonio, Loui C. ([id number]) Necrotic Amount: Small (1-33%) Fat Layer (Subcutaneous Tissue) Exposed: No Necrotic Quality: Adherent Slough Tendon Exposed: No Muscle Exposed: No Joint Exposed: No Bone Exposed: No Limited to Skin Breakdown Periwound Skin Texture Texture Color No Abnormalities Noted: No No Abnormalities Noted: No Moisture Temperature / Pain No Abnormalities Noted: No Temperature: No Abnormality Maceration: Yes Tenderness on Palpation: Yes Wound Preparation Ulcer Cleansing: Rinsed/Irrigated with Saline, Other: soap and water, Topical Anesthetic Applied: None, Other: lidocaine 4%, Treatment Notes Wound #2 (Right, Medial Malleolus) 1. Cleansed with: Clean wound with Normal Saline Cleanse wound with antibacterial soap and water 4. Dressing Applied: Hydrafera Blue 5.  Secondary Dressing Applied ABD Pad 7. Secured with Tape 3 Layer Compression System - Right Lower Extremity Notes drawtex, unna to anchor, TCA Electronic Signature(s) Signed: 11/06/2016 5:20:09 PM By: Alejandro MullingPinkerton, Debra Entered By: Alejandro MullingPinkerton, Debra on 11/06/2016 13:39:05 [name] StackBLACKWELL, Niccolo C. ([id number]) -------------------------------------------------------------------------------- Wound Assessment Details Patient Name: [name] StackBLACKWELL, Achille C. Date of Service: 11/06/2016 1:30 PM Medical Record Number: [id number] Patient Account Number: [id number] Date of Birth/Sex: [birth year] [age] y.o. Male) Treating RN: Phillis HaggisPinkerton, [name] Primary Care Nickole Adamek: [facility] [place] Other Clinician: Referring Jaeden Westbay: [facility] [place] Treating Sakiyah Shur/Extender: Maxwell CaulOBSON, [name] G Weeks in Treatment: 42 Wound Status Wound Number: 3 Primary Etiology: Open Surgical Wound Wound Location: Right, Posterior Lower Leg Wound Status: Healed - Epithelialized Wounding Event: Surgical Injury Date Acquired: 10/08/2016 Weeks Of Treatment: 4 Clustered Wound: No Wound Measurements Length: (cm) 0 % Reduction Width: (cm) 0 % Reduction Depth: (cm) 0 Area: (cm) 0 Volume: (cm) 0 in Area: 100% in Volume: 100% Wound Description Classification: Partial Thickness Periwound Skin Texture Texture Color No Abnormalities Noted: No No Abnormalities Noted: No Moisture No Abnormalities Noted: No Electronic Signature(s) Signed: 11/06/2016 5:20:09 PM By: Alejandro MullingPinkerton, Debra Entered By: Alejandro MullingPinkerton, Debra on 11/06/2016 13:56:19 [name] StackBLACKWELL, Byron C. ([id number]) -------------------------------------------------------------------------------- Vitals Details Patient Name: [name] StackBLACKWELL, Johnothan C. Date of Service: 11/06/2016 1:30 PM Medical Record Number: [id number] Patient Account Number: [id number] Date of Birth/Sex: [birth year] [age] y.o. Male) Treating RN: Phillis HaggisPinkerton, [name] Primary Care Jaretssi Kraker: [facility] [place] Other Clinician: Referring Lamere Lightner:  [facility] [place] Treating [place] Spainhower/Extender: Maxwell CaulOBSON, [name] G Weeks in Treatment: 42 Vital Signs Time Taken: 13:30 Temperature (F): 98.1 Height (in): 76 Pulse (bpm): 100 Weight (lbs): 238 Respiratory Rate (breaths/min): 18 Body Mass Index (BMI): 29 Blood Pressure (mmHg): 122/85 Reference Range: 80 - 120 mg / dl Electronic Signature(s) Signed: 11/06/2016 5:20:09 PM By: Alejandro MullingPinkerton, Debra Entered By: Alejandro MullingPinkerton, Debra on 11/06/2016 13:30:45

## 2016-11-09 DIAGNOSIS — I87331 Chronic venous hypertension (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: Secondary | ICD-10-CM | POA: Diagnosis not present

## 2016-11-12 NOTE — Progress Notes (Addendum)
[name], Shyheim C. ([id number]) Visit Report for 11/09/2016 Arrival Information Details Patient Name: [name], [name] Date of Service: 11/09/2016 3:45 PM Medical Record Number: [id number] Patient Account Number: [id number] Date of Birth/Sex: May 03, 1946 [age] y.o. Male) Treating RN: Curtis Sitesorthy, Joanna Primary Care Itzel Lowrimore: [place] Other Clinician: Referring Macenzie Burford: [facility] [place] Treating Guila Owensby/Extender: Rudene ReBritto, Errol Weeks in Treatment: 4542 Visit Information History Since Last Visit Added or deleted any medications: No Patient Arrived: Ambulatory Any new allergies or adverse reactions: No Arrival Time: 13:45 Had a fall or experienced change in No Accompanied By: self activities of daily living that may affect Transfer Assistance: None risk of falls: Patient Identification Verified: Yes Signs or symptoms of abuse/neglect since last No Secondary Verification Process Yes visito Completed: Hospitalized since last visit: No Patient Requires Transmission-Based No Has Dressing in Place as Prescribed: Yes Precautions: Has Compression in Place as Prescribed: Yes Patient Has Alerts: No Pain Present Now: No Electronic Signature(s) Signed: 11/12/2016 12:58:55 PM By: Curtis Sitesorthy, Joanna Entered By: Curtis Sitesorthy, Joanna on 11/12/2016 12:58:55 [name], [name] ([id number]) -------------------------------------------------------------------------------- Encounter Discharge Information Details Patient Name: [name], [name] Date of Service: 11/09/2016 3:45 PM Medical Record Number: [id number] Patient Account Number: [id number] Date of Birth/Sex: May 03, 1946 [age] y.o. Male) Treating RN: Curtis Sitesorthy, Joanna Primary Care Arabell Neria: [place] Other Clinician: Referring Sopheap Basic: [facility] [place] Treating Garvis Downum/Extender: Rudene ReBritto, Errol Weeks in Treatment: 5642 Encounter Discharge Information Items Discharge Pain Level: 0 Discharge Condition: Stable Ambulatory Status: Ambulatory Discharge  Destination: Home Private Transportation: Auto Accompanied By: self Schedule Follow-up Appointment: Yes Medication Reconciliation completed and No provided to Patient/Care Surie Suchocki: Clinical Summary of Care: Electronic Signature(s) Signed: 11/12/2016 1:25:01 PM By: Curtis Sitesorthy, Joanna Entered By: Curtis Sitesorthy, Joanna on 11/12/2016 13:25:01 [name], [name] ([id number]) -------------------------------------------------------------------------------- Patient/Caregiver Education Details Patient Name: [name], [name] Date of Service: 11/09/2016 3:45 PM Medical Record Number: [id number] Patient Account Number: [id number] Date of Birth/Gender: May 03, 1946 [age] y.o. Male) Treating RN: Curtis Sitesorthy, Joanna Primary Care Physician: [place] Other Clinician: Referring Physician: [facility] [place] Treating Physician/Extender: Rudene ReBritto, Errol Weeks in Treatment: 1942 Education Assessment Education Provided To: Patient Education Topics Provided Wound/Skin Impairment: Handouts: Other: leg elevation Methods: Explain/Verbal Responses: State content correctly Electronic Signature(s) Signed: 11/12/2016 4:59:17 PM By: Curtis Sitesorthy, Joanna Entered By: Curtis Sitesorthy, Joanna on 11/12/2016 12:59:51

## 2016-11-13 ENCOUNTER — Encounter: Payer: Medicare HMO | Admitting: Internal Medicine

## 2016-11-13 DIAGNOSIS — I87331 Chronic venous hypertension (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: Secondary | ICD-10-CM | POA: Diagnosis not present

## 2016-11-14 NOTE — Progress Notes (Signed)
[name], Gurinder C. ([id number]) Visit Report for 11/13/2016 Arrival Information Details Patient Name: [name], Sukhman C. Date of Service: 11/13/2016 8:00 AM Medical Record Number: [id number] Patient Account Number: [id number] Date of Birth/Sex: 15-Jul-1946 [age] y.o. Male) Treating RN: Phillis HaggisPinkerton, [name] Primary Care Emrys Mckamie: [facility] [place] Other Clinician: Referring Chaseton Yepiz: [facility] [place] Treating Ortencia Askari/Extender: Altamese CarolinaOBSON, [name] G Weeks in Treatment: 6343 Visit Information History Since Last Visit All ordered tests and consults were completed: No Patient Arrived: Ambulatory Added or deleted any medications: No Arrival Time: 08:03 Any new allergies or adverse reactions: No Accompanied By: self Had a fall or experienced change in No Transfer Assistance: None activities of daily living that may affect Patient Identification Verified: Yes risk of falls: Secondary Verification Process Yes Signs or symptoms of abuse/neglect since last No Completed: visito Patient Requires Transmission-Based No Hospitalized since last visit: No Precautions: Has Dressing in Place as Prescribed: Yes Patient Has Alerts: No Has Compression in Place as Prescribed: Yes Pain Present Now: No Electronic Signature(s) Signed: 11/13/2016 4:55:45 PM By: Alejandro MullingPinkerton, Debra Entered By: Alejandro MullingPinkerton, Debra on 11/13/2016 08:05:50 [name], Brodey C. ([id number]) -------------------------------------------------------------------------------- Encounter Discharge Information Details Patient Name: [name], Deaglan C. Date of Service: 11/13/2016 8:00 AM Medical Record Number: [id number] Patient Account Number: [id number] Date of Birth/Sex: 15-Jul-1946 [age] y.o. Male) Treating RN: Phillis HaggisPinkerton, [name] Primary Care [place]ee [place] Other Clinician: Referring Joell Usman: [facility] [place] Treating Madilyn Cephas/Extender: Maxwell CaulOBSON, [name] G Weeks in Treatment: 2443 Encounter Discharge Information Items Discharge Pain Level: 0 Discharge  Condition: Stable Ambulatory Status: Ambulatory Discharge Destination: Home Transportation: Private Auto Accompanied By: self Schedule Follow-up Appointment: Yes Medication Reconciliation completed No and provided to Patient/Care Trica Usery: Patient Clinical Summary of Care: Declined Electronic Signature(s) Signed: 11/13/2016 8:39:40 AM By: Gwenlyn PerkingMoore, Shelia Entered By: Gwenlyn PerkingMoore, Shelia on 11/13/2016 08:39:40 [name], Raife C. ([id number]) -------------------------------------------------------------------------------- Lower Extremity Assessment Details Patient Name: [name], Caldwell C. Date of Service: 11/13/2016 8:00 AM Medical Record Number: [id number] Patient Account Number: [id number] Date of Birth/Sex: 15-Jul-1946 [age] y.o. Male) Treating RN: Phillis HaggisPinkerton, [name] Primary Care Shawanna Zanders: [facility] [place] Other Clinician: Referring Kaleena Corrow: [facility] [place] Treating Donabelle Molden/Extender: Maxwell CaulOBSON, [name] G Weeks in Treatment: 43 Edema Assessment Assessed: [Left: No] [Right: No] E[Left: dema] [Right: :] Calf Left: Right: Point of Measurement: 40 cm From Medial Instep cm 34.4 cm Ankle Left: Right: Point of Measurement: 12 cm From Medial Instep cm 22 cm Vascular Assessment Pulses: Dorsalis Pedis Palpable: [Right:Yes] Posterior Tibial Extremity colors, hair growth, and conditions: Extremity Color: [Right:Normal] Temperature of Extremity: [Right:Warm] Capillary Refill: [Right:< 3 seconds] Electronic Signature(s) Signed: 11/13/2016 4:55:45 PM By: Alejandro MullingPinkerton, Debra Entered By: Alejandro MullingPinkerton, Debra on 11/13/2016 08:21:22 [name], Riggs C. ([id number]) -------------------------------------------------------------------------------- Multi Wound Chart Details Patient Name: [name], Clevland C. Date of Service: 11/13/2016 8:00 AM Medical Record Number: [id number] Patient Account Number: [id number] Date of Birth/Sex: 15-Jul-1946 [age] y.o. Male) Treating RN: Phillis HaggisPinkerton, [name] Primary Care Jovonta Levit: [facility]  [place] Other Clinician: Referring Verneal Wiers: [facility] [place] Treating Argusta Mcgann/Extender: Maxwell CaulOBSON, [name] G Weeks in Treatment: 43 Vital Signs Height(in): 76 Pulse(bpm): 81 Weight(lbs): 238 Blood Pressure 171/80 (mmHg): Body Mass Index(BMI): 29 Temperature(F): 98.3 Respiratory Rate 18 (breaths/min): Photos: [1:No Photos] [2:No Photos] [N/A:N/A] Wound Location: [1:Right Malleolus - Lateral] [2:Right Malleolus - Medial] [N/A:N/A] Wounding Event: [1:Gradually Appeared] [2:Gradually Appeared] [N/A:N/A] Primary Etiology: [1:Venous Leg Ulcer] [2:Venous Leg Ulcer] [N/A:N/A] Comorbid History: [1:Cataracts, Chronic Obstructive Pulmonary Disease (COPD), Osteoarthritis] [2:Cataracts, Chronic Obstructive Pulmonary Disease (COPD), Osteoarthritis] [N/A:N/A] Date Acquired: [1:08/11/2015] [2:01/30/2016] [N/A:N/A] Weeks of Treatment: [1:43] [2:41] [N/A:N/A] Wound Status: [1:Open] [2:Open] [N/A:N/A] Clustered Wound: [1:No] [2:Yes] [N/A:N/A] Measurements L x W  x D 5.5x5.5x0.2 [2:2.5x1.5x0.2] [N/A:N/A] (cm) Area (cm) : [1:23.758] [2:2.945] [N/A:N/A] Volume (cm) : [1:4.752] [2:0.589] [N/A:N/A] % Reduction in Area: [1:-37.50%] [2:74.50%] [N/A:N/A] % Reduction in Volume: 8.30% [2:49.00%] [N/A:N/A] Classification: [1:Full Thickness Without Exposed Support Structures] [2:Full Thickness Without Exposed Support Structures] [N/A:N/A] Exudate Amount: [1:Large] [2:Large] [N/A:N/A] Exudate Type: [1:Serosanguineous] [2:Serosanguineous] [N/A:N/A] Exudate Color: [1:red, brown] [2:red, brown] [N/A:N/A] Wound Margin: [1:Distinct, outline attached] [2:Distinct, outline attached] [N/A:N/A] Granulation Amount: [1:Large (67-100%)] [2:Large (67-100%)] [N/A:N/A] Granulation Quality: [1:Red] [2:Red] [N/A:N/A] Necrotic Amount: [1:Small (1-33%)] [2:Small (1-33%)] [N/A:N/A] Exposed Structures: [1:Fascia: No Fat Layer (Subcutaneous] [2:Fascia: No Fat Layer (Subcutaneous] [N/A:N/A] Tissue) Exposed: No Tissue) Exposed:  No Tendon: No Tendon: No Muscle: No Muscle: No Joint: No Joint: No Bone: No Bone: No Limited to Skin Limited to Skin Breakdown Breakdown Epithelialization: Small (1-33%) Small (1-33%) N/A Periwound Skin Texture: No Abnormalities Noted No Abnormalities Noted N/A Periwound Skin No Abnormalities Noted Maceration: Yes N/A Moisture: Periwound Skin Color: No Abnormalities Noted No Abnormalities Noted N/A Temperature: No Abnormality No Abnormality N/A Tenderness on No Yes N/A Palpation: Wound Preparation: Ulcer Cleansing: Ulcer Cleansing: N/A Rinsed/Irrigated with Rinsed/Irrigated with Saline, Other: soap and Saline, Other: soap and water water Topical Anesthetic Topical Anesthetic Applied: None, Other: Applied: None, Other: lidocaine 4% lidocaine 4% Treatment Notes Wound #1 (Right, Lateral Malleolus) 1. Cleansed with: Clean wound with Normal Saline Cleanse wound with antibacterial soap and water 2. Anesthetic Topical Lidocaine 4% cream to wound bed prior to debridement 4. Dressing Applied: Hydrafera Blue 5. Secondary Dressing Applied ABD Pad 7. Secured with Tape 3 Layer Compression System - Right Lower Extremity Notes drawtex, unna to anchor Wound #2 (Right, Medial Malleolus) 1. Cleansed with: Clean wound with Normal Saline Cleanse wound with antibacterial soap and water 2. Anesthetic Topical Lidocaine 4% cream to wound bed prior to debridement 4. Dressing Applied: LAWYER, [place] ([id number]) Hydrafera Blue 5. Secondary Dressing Applied ABD Pad 7. Secured with Tape 3 Layer Compression System - Right Lower Extremity Notes drawtex, unna to anchor Electronic Signature(s) Signed: 11/13/2016 4:28:03 PM By: [name] MD Entered By: [name] on 11/13/2016 08:26:17 [name] Stack ([id number]) -------------------------------------------------------------------------------- Multi-Disciplinary Care Plan Details Patient Name: [name] Stack Date of Service: 11/13/2016 8:00 AM Medical Record Number: [id number] Patient Account Number: [id number] Date of Birth/Sex: 1946/04/20 (71 y.o. Male) Treating RN: [name] Primary Care Ada Woodbury: [place], [place] Other Clinician: Referring Lakenzie Mcclafferty: [facility], [place] Treating Katanya Schlie/Extender: [name] Weeks in Treatment: 9 Active Inactive ` Venous Leg Ulcer Nursing Diagnoses: Knowledge deficit related to disease process and management Goals: Patient will maintain optimal edema control Date Initiated: 04/03/2016 Target Resolution Date: 11/24/2016 Goal Status: Active Interventions: Compression as ordered Treatment Activities: Therapeutic compression applied : 04/03/2016 Notes: ` Wound/Skin Impairment Nursing Diagnoses: Impaired tissue integrity Goals: Ulcer/skin breakdown will heal within 14 weeks Date Initiated: 01/17/2016 Target Resolution Date: 11/24/2016 Goal Status: Active Interventions: Assess ulceration(s) every visit Notes: Electronic Signature(s) Signed: 11/13/2016 4:55:45 PM By: Aurelio Jew, [name] ([id number]) Entered By: Alejandro Mulling on 11/13/2016 08:23:06 [name] Stack ([id number]) -------------------------------------------------------------------------------- Pain Assessment Details Patient Name: [name] Stack Date of Service: 11/13/2016 8:00 AM Medical Record Number: [id number] Patient Account Number: [id number] Date of Birth/Sex: 1946-07-19 (71 y.o. Male) Treating RN: [name] Primary Care Jossue Rubenstein: [facility], [place] Other Clinician: Referring Kelia Gibbon: [facility] [place] Treating Lismary Kiehn/Extender: [name] Weeks in Treatment: 19 Active Problems Location of Pain Severity and Description of Pain Patient Has Paino No Site Locations With Dressing Change: No Pain Management and Medication Current  Pain Management: Electronic Signature(s) Signed: 11/13/2016 4:55:45 PM By: Alejandro Mulling Entered By:  Alejandro Mulling on 11/13/2016 08:07:17 [name] Stack ([id number]) -------------------------------------------------------------------------------- Patient/Caregiver Education Details [name] Date of Service: 11/13/2016 8:00 AM Patient Name: C. Patient Account Number: [id number] Medical Record Treating RN: [name] [phone] Number: Other Clinician: Date of Birth/Gender: 1945/12/11 (70 y.o. Male) Treating [name] Primary Care Physician: [facility], [place] Physician/Extender: G Referring Physician: [facility], [place] Weeks in Treatment: 72 Education Assessment Education Provided To: Patient Education Topics Provided Wound/Skin Impairment: Handouts: Other: change dressing as ordered Methods: Demonstration, Explain/Verbal Responses: State content correctly Electronic Signature(s) Signed: 11/13/2016 4:55:45 PM By: Alejandro Mulling Entered By: Alejandro Mulling on 11/13/2016 08:25:54 [name] Stack ([id number]) -------------------------------------------------------------------------------- Wound Assessment Details Patient Name: [name] Stack Date of Service: 11/13/2016 8:00 AM Medical Record Number: [id number] Patient Account Number: [id number] Date of Birth/Sex: May 04, 1946 (71 y.o. Male) Treating RN: [name] Primary Care Jeannett Dekoning: [facility] [place] Other Clinician: Referring Ji Fairburn: [facility], [place] Treating Cina Klumpp/Extender: [name] Weeks in Treatment: 43 Wound Status Wound Number: 1 Primary Venous Leg Ulcer Etiology: Wound Location: Right Malleolus - Lateral Wound Open Wounding Event: Gradually Appeared Status: Date Acquired: 08/11/2015 Comorbid Cataracts, Chronic Obstructive Weeks Of Treatment: 43 History: Pulmonary Disease (COPD), Clustered Wound: No Osteoarthritis Photos Photo Uploaded By: Alejandro Mulling on 11/13/2016 09:11:42 Wound Measurements Length: (cm) 5.5 Width: (cm) 5.5 Depth: (cm) 0.2 Area: (cm)  23.758 Volume: (cm) 4.752 % Reduction in Area: -37.5% % Reduction in Volume: 8.3% Epithelialization: Small (1-33%) Tunneling: No Undermining: No Wound Description Full Thickness Without Exposed Foul Odor Afte Classification: Support Structures Slough/Fibrino Wound Margin: Distinct, outline attached Exudate Large Amount: Exudate Type: Serosanguineous Exudate Color: red, brown r Cleansing: No Yes Wound Bed Granulation Amount: Large (67-100%) Exposed Structure Granulation Quality: Red Fascia Exposed: No Yono, Yahmir C. ([id number]) Necrotic Amount: Small (1-33%) Fat Layer (Subcutaneous Tissue) Exposed: No Necrotic Quality: Adherent Slough Tendon Exposed: No Muscle Exposed: No Joint Exposed: No Bone Exposed: No Limited to Skin Breakdown Periwound Skin Texture Texture Color No Abnormalities Noted: No No Abnormalities Noted: No Moisture Temperature / Pain No Abnormalities Noted: No Temperature: No Abnormality Wound Preparation Ulcer Cleansing: Rinsed/Irrigated with Saline, Other: soap and water, Topical Anesthetic Applied: None, Other: lidocaine 4%, Treatment Notes Wound #1 (Right, Lateral Malleolus) 1. Cleansed with: Clean wound with Normal Saline Cleanse wound with antibacterial soap and water 2. Anesthetic Topical Lidocaine 4% cream to wound bed prior to debridement 4. Dressing Applied: Hydrafera Blue 5. Secondary Dressing Applied ABD Pad 7. Secured with Tape 3 Layer Compression System - Right Lower Extremity Notes drawtex, unna to anchor Electronic Signature(s) Signed: 11/13/2016 4:55:45 PM By: Alejandro Mulling Entered By: Alejandro Mulling on 11/13/2016 08:19:18 [name] Stack ([id number]) -------------------------------------------------------------------------------- Wound Assessment Details Patient Name: [name] Stack Date of Service: 11/13/2016 8:00 AM Medical Record Number: [id number] Patient Account Number: [id number] Date of  Birth/Sex: 10-Jun-1946 (71 y.o. Male) Treating RN: [name] Primary Care Azyiah Bo: [facility] [place] Other Clinician: Referring Layna Roeper: [facility] [place] Treating Liisa Picone/Extender: [name] Weeks in Treatment: 43 Wound Status Wound Number: 2 Primary Venous Leg Ulcer Etiology: Wound Location: Right Malleolus - Medial Wound Open Wounding Event: Gradually Appeared Status: Date Acquired: 01/30/2016 Comorbid Cataracts, Chronic Obstructive Weeks Of Treatment: 41 History: Pulmonary Disease (COPD), Clustered Wound: Yes Osteoarthritis Photos Photo Uploaded By: Alejandro Mulling on 11/13/2016 09:11:57 Wound Measurements Length: (cm) 2.5 Width: (cm) 1.5 Depth: (cm) 0.2 Area: (cm) 2.945 Volume: (cm) 0.589 % Reduction in Area: 74.5% % Reduction in Volume: 49% Epithelialization: Small (  1-33%) Tunneling: No Undermining: No Wound Description Full Thickness Without Exposed Foul Odor Afte Classification: Support Structures Slough/Fibrino Wound Margin: Distinct, outline attached Exudate Large Amount: Exudate Type: Serosanguineous Exudate Color: red, brown r Cleansing: No Yes Wound Bed Granulation Amount: Large (67-100%) Exposed Structure Granulation Quality: Red Fascia Exposed: No Reinertsen, Meshulem C. ([id number]) Necrotic Amount: Small (1-33%) Fat Layer (Subcutaneous Tissue) Exposed: No Necrotic Quality: Adherent Slough Tendon Exposed: No Muscle Exposed: No Joint Exposed: No Bone Exposed: No Limited to Skin Breakdown Periwound Skin Texture Texture Color No Abnormalities Noted: No No Abnormalities Noted: No Moisture Temperature / Pain No Abnormalities Noted: No Temperature: No Abnormality Maceration: Yes Tenderness on Palpation: Yes Wound Preparation Ulcer Cleansing: Rinsed/Irrigated with Saline, Other: soap and water, Topical Anesthetic Applied: None, Other: lidocaine 4%, Treatment Notes Wound #2 (Right, Medial Malleolus) 1. Cleansed with: Clean wound  with Normal Saline Cleanse wound with antibacterial soap and water 2. Anesthetic Topical Lidocaine 4% cream to wound bed prior to debridement 4. Dressing Applied: Hydrafera Blue 5. Secondary Dressing Applied ABD Pad 7. Secured with Tape 3 Layer Compression System - Right Lower Extremity Notes drawtex, unna to anchor Electronic Signature(s) Signed: 11/13/2016 4:55:45 PM By: Alejandro Mulling Entered By: Alejandro Mulling on 11/13/2016 08:19:52 [name] Stack ([id number]) -------------------------------------------------------------------------------- Vitals Details Patient Name: [name] Stack Date of Service: 11/13/2016 8:00 AM Medical Record Number: [id number] Patient Account Number: [id number] Date of Birth/Sex: 1945/10/14 (71 y.o. Male) Treating RN: [name] Primary Care Ottie Tillery: [facility] [place] Other Clinician: Referring Brynlie Daza: [facility] [place] Treating Mekenna Finau/Extender: [name] Weeks in Treatment: 54 Vital Signs Time Taken: 08:07 Temperature (F): 98.3 Height (in): 76 Pulse (bpm): 81 Weight (lbs): 238 Respiratory Rate (breaths/min): 18 Body Mass Index (BMI): 29 Blood Pressure (mmHg): 171/80 Reference Range: 80 - 120 mg / dl Electronic Signature(s) Signed: 11/13/2016 4:55:45 PM By: Alejandro Mulling Entered By: Alejandro Mulling on 11/13/2016 08:10:26

## 2016-11-14 NOTE — Progress Notes (Signed)
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 debridement is certainly easier 04/10/2016 -- he has been scheduled for a venous duplex study towards the end of the month 04/17/16; has been using silver alginate, states that the Iodoflex was hurting his wound and since that is been changed he has had no pain unfortunately the surface of the wound continues to be unhealthy with Stansell, Treshawn C. ([id number]) thick gelatinous slough and nonviable tissue. The wound will not heal like this. 04/20/2016 -- the patient was here for a nurse visit but I was asked to see the patient as the slough was quite significant and the nurse needed for clarification regarding the ointment to be used. 04/24/16; the patient's wounds on the right medial and right lateral ankle/malleolus both look a lot better today. Less adherent slough healthier tissue. Dimensions better especially medially 05/01/16; the patient's wound surface continues to improve however he continues to require debridement switch her easier each week. Continue Santyl/Metahydrin mixture Hydrofera Blue next week. Still drainage on the medial aspect according to the intake nurse 05/08/16; still using Santyl and Medihoney.  Still a lot of drainage per her intake nurse. Patient has no complaints pain fever chills etc. 05/15/16 switched the Hydrofera Blue last week. Dimensions down especially in the medial right leg wound. Area on the lateral which is more substantial also looks better still requires debridement 05/22/16; we have been using Hydrofera Blue. 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 Currently he tells me that the pain is doing very well about a 1 out of 10 today. Yesterday was a little bit worse but he tells me that he was more active watering his flowers that day. Overall he feels that his symptoms are doing significantly better at this point in time. His edema continues to be controlled well with the 4-layer compression wrap and he really has not noted any odor at this point in time. He is tolerating the dressing changes when they are performed well. 06/05/16 at this point in time today patient currently shows no interval signs or symptoms of local or systemic infection.  Again his pain level he rates to be a 1 out of 10 at most and overall he tells me that generally this is not giving him much trouble. In fact he even feels maybe a little bit better than last week. We have continue with the 4-layer compression wrap in which she tolerates very well at this point. He is continuing to utilize the [place]. 06/12/16 I think there has been some progression in the status of both of these wounds over today again covered in a gelatinous surface. Has been using Hydrofera Blue. We had used Iodoflex in the past I'm not sure if there was an issue other than changing to something that might progress towards closure faster 06/19/16; he did not tolerate the Flexeril last week secondary to pain and this was changed on Friday  back to [facility] area he continues to have copious amounts of gelatinous surface slough which is think inhibiting the speed of healing this area 06/26/16 patient over the last week has utilized the Santyl to try to loosen up some of the tightly adherent slough that was noted on evaluation last week. The good news is he tells me that the medial malleoli region really does not bother him the right llateral malleoli region is more tender to palpation at this point in time especially in the central/inferior location. However it does appear that the Santyl has done his job to loosen up the adherent slough at this point in time. Fortunately he has no interval signs or symptoms of infection locally or systemically no purulent discharge noted. 07/03/16 at this point in time today patient's wounds appear to be significantly improved over the right medial and lateral malleolus locations. He has much less tenderness at this point in time and the wounds appear clean her although there is still adherent slough this is sufficiently improved over what I saw last week. I still see no evidence of local infection. 07/10/16; continued gradual improvement in the right medial and lateral malleolus locations. The lateral is more substantial wound now divided into 2 by a rim of normal epithelialization. 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 He denies any complaints of pain or intolerance to compression. Both ulcers are smaller in size oriented today's measurements, both are covered with a softly adherent slough. 07/24/16; medial wound is smaller, lateral about the same although surface looks better. Still using [name], [name] ([id number]) Hydrofera Blue 07/31/16; arrives today complaining of pain in the lateral part of his foot. Nurse reports a lot more drainage. He has been using  Hydrofera Blue. Switch to silver alginate today 08/03/2016 -- I was asked to see the patient was here for a nurse visit today. I understand he had a lot of pain in his right lower extremity and was having blisters on his right foot which have not been there before. Though he started on doxycycline he does not have blisters elsewhere on his body. I do not believe this is a drug allergy. also mentioned that there was a copious purulent discharge from the wound and clinically there is no evidence of cellulitis. 08/07/16; I note that the patient came in for his nurse check on Friday apparently with blisters on his toes on the right than a lot of swelling in his forefoot. He continued on the doxycycline that I had prescribed on 12/8. A culture was done of the lateral wound  that showed a combination of a few Proteus and Pseudomonas. Doxycycline might of covered the Proteus but would be unlikely to cover the Pseudomonas. He is on Coumadin. He arrives in the clinic today feeling a lot better states the pain is a lot better but nothing specific really was done other than to rewrap the foot also noted that he had arterial studies ordered in August although these were never done. It is reasonable to go ahead and reorder these. 08/14/16; generally arrives in a better state today in terms of the wounds he has taken cefdinir for one week. Our intake nurse reports copious amounts of drainage but the patient is complaining of much less pain. He is not had his PT and INR checked and I've asked him to do this today or tomorrow. 08/24/2016 -- patient arrives today after 10 days and said he had a stomach upset. His arterial study was done and I have reviewed this report and find it to be within normal limits. However I did not note any venous duplex studies for reflux, and Dr. Leanord Hawkingobson may have ordered these in the past but I will leave it to him to decide if he needs these. The patient has finished his course of  cefdinir. 08/28/16; patient arrives today again with copious amounts of thick really green drainage for our intake nurse. He states he has a very tender spot at the superior part of the lateral wound. Wounds are larger 09/04/16; no real change in the condition of this patient's wound still copious amounts of surface slough. Started him on Iodoflex last week he is completing another course of Cefdinir or which I think was done empirically. His arterial study showed ABIs were 1.1 on the right 1.5 on the left. He did have a slightly reduced ABI in the right the left one was not obtained. Had calcification of the right posterior tibial artery. The interpretation was no segmental stenosis. His waveforms were triphasic. His reflux studies are later this month. Depending on this I'll send him for a vascular consultation, he may need to see plastic surgery as I believe he is had plastic surgery on this foot in the past. He had an injury to the foot in the 1980s. 1/16 /18 right lateral greater than right medial ankle wounds on the right in the setting of previous skin grafting. 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 He does not have arterial issues. Each week he comes in with the same adherent surface slough although there was less of this today 09/18/16; right lateral greater than right medial lower extremity wounds in the setting of previous skin grafting and trauma. He has least to vein laser ablation scheduled for February 2 for venous reflux. He does not have significant arterial disease. Problem has been very difficult to handle surface slough/necrotic tissue. Recently using Iodoflex for this with some, albeit slow improvement 09/25/16; right lateral greater than right medial lower extremity venous wounds in the setting of previous skin grafting. He is going for ablation surgery on February 2 after this he'll come back  here for rewrap. He has been using Iodoflex as the primary dressing. 10/02/16; right lateral greater than right medial lower extremity wounds in the setting of previous skin grafting. He had his ablation surgery last week, I don't have a report. He tolerated this well. Came in with a thigh- high Unna boots on Friday.  We have been using Iodoflex as the primary dressing. His measurements are improving 10/09/16; continues to make nice aggressive in terms of the wounds on his lateral and medial right ankle in the setting of previous skin grafting. Yesterday he noticed drainage at one of his surgical sites from his [name], [name]. ([id number]) venous ablation on the right calf. He took off the bandage over this area felt a "popping" sensation and a reddish-brown drainage. He is not complaining of any pain 10/16/16; he continues to make nice progression in terms of the wounds on the lateral and medial malleolus. Both smaller using Iodoflex. He had a surgical area in his posterior mid calf we have been using iodoform. All the wounds are down and dimensions 10/23/16; the patient arrives today with no complaints. He states the Iodoflex is a bit uncomfortable. He is not systemically unwell. We have been using Iodoflex to the lateral right ankle and the medial and Aquacel Ag to the reflux surgical wound on the posterior right calf. All of these wounds are doing well 10/30/16; patient states he has no pain no systemic symptoms. I changed him to [facility] last week. Although the wounds are doing well 11/06/16; patient reports no pain or systemic symptoms. We continue with Hydrofera Blue. Both wound areas on the medial and lateral ankle appear to be doing well with improvement and dimensions and improvement in the wound bed. 11/13/16; patient's dimensions continued to improve. We continue with Hydrofera Blue on the medial and lateral side. Appear to be doing well with healthy granulation and advancing  epithelialization Objective Constitutional Patient is hypertensive.. Pulse regular and within target range for patient.Marland Kitchen Respirations regular, non-labored and within target range.. Temperature is normal and within the target range for the patient.. Patient's appearance is neat and clean. Appears in no acute distress. Well nourished and well developed.. Vitals Time Taken: 8:07 AM, Height: 76 in, Weight: 238 lbs, BMI: 29, Temperature: 98.3 F, Pulse: 81 bpm, Respiratory Rate: 18 breaths/min, Blood Pressure: 171/80 mmHg. Eyes Conjunctivae clear. No discharge.Marland Kitchen Respiratory Respiratory effort is easy and symmetric bilaterally. Rate is normal at rest and on room air.. Cardiovascular Pedal pulses palpable and strong on the right. 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 Lymphatic Nonpalpable in the popliteal or inguinal area on the right. Psychiatric No evidence of depression, anxiety, or agitation. Calm, cooperative, and communicative. Appropriate interactions and affect.. General Notes: M; his original large wound on the lateral aspect of the right lateral ankle continue to make [name], [name] progress. He has an [place] of normal epithelialization in the middle. Granulation on either side of this looks healthy and the wound is certainly a lot narrower The area on his medial right ankle also looks like it's contracting nicely. Healthy granulation. No debridement required on any wound and there is no evidence of concomitant infection Integumentary (Hair, Skin) Wound #1 status is Open. Original cause of wound was Gradually Appeared. The wound is located on the Right,Lateral Malleolus. The wound measures 5.5cm length x 5.5cm width x 0.2cm depth; 23.758cm^2 area and 4.752cm^3 volume. The wound is limited to skin breakdown. There is no tunneling or undermining noted. There is a large amount of serosanguineous drainage noted. The wound margin is distinct with  the outline attached to the wound base. There is large (67-100%) red granulation within the wound bed. There is a small (1-33%) amount of necrotic tissue within the wound bed including Adherent Slough. Periwound temperature was noted as No Abnormality.  Wound #2 status is Open. Original cause of wound was Gradually Appeared. The wound is located on the Right,Medial Malleolus. The wound measures 2.5cm length x 1.5cm width x 0.2cm depth; 2.945cm^2 area and 0.589cm^3 volume. The wound is limited to skin breakdown. There is no tunneling or undermining noted. There is a large amount of serosanguineous drainage noted. The wound margin is distinct with the outline attached to the wound base. There is large (67-100%) red granulation within the wound bed. There is a small (1-33%) amount of necrotic tissue within the wound bed including Adherent Slough. The periwound skin appearance exhibited: Maceration. Periwound temperature was noted as No Abnormality. The periwound has tenderness on palpation. 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 [name], [name] ([id number]) No tub bath. Wound #2 Right,Medial Malleolus: Cleanse wound with mild soap and water May shower with protection. No tub bath. Anesthetic: Wound #1 Right,Lateral  Malleolus: Topical Lidocaine 4% cream applied to wound bed prior to debridement Wound #2 Right,Medial Malleolus: Topical Lidocaine 4% cream applied to wound bed prior to debridement Skin Barriers/Peri-Wound Care: Wound #1 Right,Lateral Malleolus: Barrier cream - zinc oxide Triamcinolone Acetonide Ointment Primary Wound Dressing: Wound #1 Right,Lateral Malleolus: Hydrafera Blue Wound #2 Right,Medial Malleolus: Hydrafera Blue Secondary Dressing: Wound #1 Right,Lateral Malleolus: ABD pad Drawtex Wound #2 Right,Medial Malleolus: ABD pad Drawtex Dressing Change Frequency: Wound #1 Right,Lateral Malleolus: Change dressing every week Other: - nurse visit Friday Wound #2 Right,Medial Malleolus: Change dressing every week Other: - nurse visit Friday Follow-up Appointments: Wound #1 Right,Lateral Malleolus: Return Appointment in 1 week. Other: - nurse visit Friday Wound #2 Right,Medial Malleolus: Return Appointment in 1 week. Other: - nurse visit Friday Edema Control: Wound #1 Right,Lateral Malleolus: 3 Layer Compression System - Right Lower Extremity - UNNA TO ANCHOR Elevate legs to the level of the heart and pump ankles as often as possible Wound #2 Right,Medial Malleolus: 3 Layer Compression System - Right Lower Extremity - UNNA TO ANCHOR Elevate legs to the level of the heart and pump ankles as often as possible Additional Orders / Instructions: Wound #1 Right,Lateral Malleolus: Increase protein intake. OK to return to work with the following restrictions: [name], [name] ([id number]) Activity as tolerated Wound #2 Right,Medial Malleolus: Increase protein intake. OK to return to work with the following restrictions: Activity as tolerated #1 I think the patient continues to make nice progress #2 there is no evidence of concurrent ischemia or infection #3 he is status post venous ablation #4 continue Hydrofera Blue under 3 layer wraps Electronic  Signature(s) Signed: 11/13/2016 4:28:03 PM By: [name] MD Entered By: [name] on 11/13/2016 08:32:30 [name] ([id number]) -------------------------------------------------------------------------------- SuperBill Details [name] Date of Service: 11/13/2016 Patient Name: C. Patient Account Number: [id number] Medical Record Treating RN: [name] [id number] Number: Other Clinician: Date of Birth/Sex: 11/04/1945 (71 y.o. 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, initial T85.613A encounter Disruption of external operation (surgical) wound, not elsewhere classified, subsequent T81.31XD encounter L97.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 of skin Facility Procedures CPT4: Description Modifier Quantity Code [id number] (Facility Use Only) [phone] - APPLY MULTLAY COMPRS LWR RT 1 LEG Physician Procedures CPT4: Description Modifier Quantity Code [id number] - WC PHYS LEVEL 3 - EST PT 1 ICD-10 Description Diagnosis I87.331 Chronic venous hypertension (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

## 2016-11-16 DIAGNOSIS — I87331 Chronic venous hypertension (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: Secondary | ICD-10-CM | POA: Diagnosis not present

## 2016-11-17 NOTE — Progress Notes (Signed)
WOODROW, DRAB ([id number]) Visit Report for 11/16/2016 Arrival Information Details Patient Name: [name], [name]. Date of Service: 11/16/2016 8:00 AM Medical Record Number: [id number] Patient Account Number: [id number] Date of Birth/Sex: [birth year] (71 y.o. Male) Treating RN: [name] Primary Care Hadlea Furuya: [place] Other Clinician: Referring Jeffie Widdowson: [place] [place] Treating Jacie Tristan/Extender: Rudene Re in Treatment: 101 Visit Information History Since Last Visit All ordered tests and consults were completed: No Patient Arrived: Ambulatory Added or deleted any medications: No Arrival Time: 08:03 Any new allergies or adverse reactions: No Accompanied By: self Had a fall or experienced change in No Transfer Assistance: None activities of daily living that may affect Patient Identification Verified: Yes risk of falls: Secondary Verification Process Yes Signs or symptoms of abuse/neglect since last No Completed: visito Patient Requires Transmission-Based No Hospitalized since last visit: No Precautions: Has Dressing in Place as Prescribed: Yes Patient Has Alerts: No Has Compression in Place as Prescribed: Yes Pain Present Now: No Electronic Signature(s) Signed: 11/16/2016 4:34:11 PM By: Alejandro Mulling Entered By: Alejandro Mulling on 11/16/2016 08:03:55 [name] ([id number]) -------------------------------------------------------------------------------- Encounter Discharge Information Details Patient Name: [name] Date of Service: 11/16/2016 8:00 AM Medical Record Number: [id number] Patient Account Number: [id number] Date of Birth/Sex: 17-Jun-1946 (71 y.o. Male) Treating RN: [name] Primary Care Azusena Erlandson: [place], [place] Other Clinician: Referring Adger Cantera: [place] [place] Treating Godwin Tedesco/Extender: Rudene Re in Treatment: 42 Encounter Discharge Information Items Discharge Pain Level: 0 Discharge  Condition: Stable Ambulatory Status: Ambulatory Discharge Destination: Home Private Transportation: Auto Accompanied By: self Schedule Follow-up Appointment: Yes Medication Reconciliation completed and No provided to Patient/Care Gerard Cantara: Clinical Summary of Care: Electronic Signature(s) Signed: 11/16/2016 4:34:11 PM By: Alejandro Mulling Entered By: Alejandro Mulling on 11/16/2016 08:06:48 [name] ([id number]) -------------------------------------------------------------------------------- Patient/Caregiver Education Details Patient Name: [name] Date of Service: 11/16/2016 8:00 AM Medical Record Number: [id number] Patient Account Number: [id number] Date of Birth/Gender: [birth year] (71 y.o. Male) Treating RN: [name] Primary Care Physician: [place], [place] Other Clinician: Referring Physician: [place] Treating Physician/Extender: Rudene Re in Treatment: 12 Education Assessment Education Provided To: Patient Education Topics Provided Wound/Skin Impairment: Handouts: Other: change dressing as ordered Methods: Demonstration, Explain/Verbal Responses: State content correctly Electronic Signature(s) Signed: 11/16/2016 4:34:11 PM By: Alejandro Mulling Entered By: Alejandro Mulling on 11/16/2016 08:06:36 [name] ([id number]) -------------------------------------------------------------------------------- Wound Assessment Details Patient Name: [name] Date of Service: 11/16/2016 8:00 AM Medical Record Number: [id number] Patient Account Number: [id number] Date of Birth/Sex: August 29, 1945 (71 y.o. Male) Treating RN: [name] Primary Care Gerilyn Stargell: [place] Other Clinician: Referring Yarelie Hams: [place] [place] Treating Evangela Heffler/Extender: Rudene Re in Treatment: 39 Wound Status Wound Number: 1 Primary Venous Leg Ulcer Etiology: Wound Location: Right Malleolus - Lateral Wound Open Wounding  Event: Gradually Appeared Status: Date Acquired: 08/11/2015 Comorbid Cataracts, Chronic Obstructive Weeks Of Treatment: 43 History: Pulmonary Disease (COPD), Clustered Wound: No Osteoarthritis Wound Measurements Length: (cm) 5.5 Width: (cm) 5.5 Depth: (cm) 0.2 Area: (cm) 23.758 Volume: (cm) 4.752 % Reduction in Area: -37.5% % Reduction in Volume: 8.3% Epithelialization: Small (1-33%) Tunneling: No Undermining: No Wound Description Full Thickness Without Exposed Classification: Support Structures Wound Margin: Distinct, outline attached Exudate Large Amount: Exudate Type: Serosanguineous Exudate Color: red, brown Foul Odor After Cleansing: No Slough/Fibrino Yes Wound Bed Granulation Amount: Large (67-100%) Exposed Structure Granulation Quality: Red Fascia Exposed: No Necrotic Amount: Small (1-33%) Fat Layer (Subcutaneous Tissue) Exposed: No Necrotic Quality: Adherent Slough Tendon Exposed: No Muscle Exposed: No Joint Exposed: No Bone Exposed: No  Limited to Skin Breakdown Periwound Skin Texture Texture Color No Abnormalities Noted: No No Abnormalities Noted: No Moisture Temperature / Pain Ziesmer, Cordarious C. ([id number]) No Abnormalities Noted: No Temperature: No Abnormality Wound Preparation Ulcer Cleansing: Rinsed/Irrigated with Saline, Other: soap and water, Topical Anesthetic Applied: None Treatment Notes Wound #1 (Right, Lateral Malleolus) 1. Cleansed with: Clean wound with Normal Saline Cleanse wound with antibacterial soap and water 4. Dressing Applied: Hydrafera Blue 5. Secondary Dressing Applied ABD Pad Dry Gauze 7. Secured with Tape 3 Layer Compression System - Right Lower Extremity Notes unna to anchor, drawtex Electronic Signature(s) Signed: 11/16/2016 4:34:11 PM By: Alejandro MullingPinkerton, Debra Entered By: Alejandro MullingPinkerton, Debra on 11/16/2016 08:04:47 [name] StackBLACKWELL, Keveon C.  ([id number]) -------------------------------------------------------------------------------- Wound Assessment Details Patient Name: [name] StackBLACKWELL, Tanyon C. Date of Service: 11/16/2016 8:00 AM Medical Record Number: [id number] Patient Account Number: [id number] Date of Birth/Sex: [birth year](70 y.o. Male) Treating RN: Phillis HaggisPinkerton, [name] Primary Care Librada Castronovo: [place] Other Clinician: Referring Aerionna Moravek: [place] Treating Donnette Macmullen/Extender: Rudene ReBritto, Errol Weeks in Treatment: 2543 Wound Status Wound Number: 2 Primary Venous Leg Ulcer Etiology: Wound Location: Right Malleolus - Medial Wound Open Wounding Event: Gradually Appeared Status: Date Acquired: 01/30/2016 Comorbid Cataracts, Chronic Obstructive Weeks Of Treatment: 41 History: Pulmonary Disease (COPD), Clustered Wound: Yes Osteoarthritis Wound Measurements Length: (cm) 2.5 Width: (cm) 1.5 Depth: (cm) 0.2 Area: (cm) 2.945 Volume: (cm) 0.589 % Reduction in Area: 74.5% % Reduction in Volume: 49% Epithelialization: Small (1-33%) Tunneling: No Undermining: No Wound Description Full Thickness Without Exposed Classification: Support Structures Wound Margin: Distinct, outline attached Exudate Large Amount: Exudate Type: Serosanguineous Exudate Color: red, brown Foul Odor After Cleansing: No Slough/Fibrino Yes Wound Bed Granulation Amount: Large (67-100%) Exposed Structure Granulation Quality: Red Fascia Exposed: No Necrotic Amount: Small (1-33%) Fat Layer (Subcutaneous Tissue) Exposed: No Necrotic Quality: Adherent Slough Tendon Exposed: No Muscle Exposed: No Joint Exposed: No Bone Exposed: No Limited to Skin Breakdown Periwound Skin Texture Texture Color No Abnormalities Noted: No No Abnormalities Noted: No Moisture Temperature / Pain Naef, Breton C. ([id number]) No Abnormalities Noted: No Temperature: No Abnormality Maceration: Yes Tenderness on Palpation: Yes Wound Preparation Ulcer  Cleansing: Rinsed/Irrigated with Saline, Other: soap and water, Topical Anesthetic Applied: None Treatment Notes Wound #2 (Right, Medial Malleolus) 1. Cleansed with: Clean wound with Normal Saline Cleanse wound with antibacterial soap and water 4. Dressing Applied: Hydrafera Blue 5. Secondary Dressing Applied ABD Pad Dry Gauze 7. Secured with Tape 3 Layer Compression System - Right Lower Extremity Notes unna to anchor, drawtex Electronic Signature(s) Signed: 11/16/2016 4:34:11 PM By: Alejandro MullingPinkerton, Debra Entered By: Alejandro MullingPinkerton, Debra on 11/16/2016 08:04:38

## 2016-11-20 ENCOUNTER — Encounter: Payer: Medicare HMO | Admitting: Internal Medicine

## 2016-11-20 DIAGNOSIS — I87331 Chronic venous hypertension (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: Secondary | ICD-10-CM | POA: Diagnosis not present

## 2016-11-21 NOTE — Progress Notes (Signed)
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 Switched to RTD last week. 03/27/16 patient is been using Iodoflex. Wound bed has improved and debridement is certainly easier 04/10/2016 -- he has been scheduled for a venous duplex study towards the end of the month 04/17/16; has been using silver alginate, states that the Iodoflex was hurting his wound and since that is been changed he has had no pain unfortunately the surface of the wound continues to be unhealthy with Karnes, Parth C. ([id number]) thick gelatinous slough and nonviable tissue. The wound will not heal like this. 04/20/2016 -- the patient was here for a nurse visit but I was asked to see the patient as the slough was quite significant and the nurse needed for clarification regarding the ointment to be used. 04/24/16; the patient's wounds on the right medial and right lateral ankle/malleolus both look a lot better today. Less adherent slough healthier tissue. Dimensions better especially medially 05/01/16; the patient's wound surface continues to improve however he continues to require debridement switch her easier each week. Continue Santyl/Metahydrin mixture Hydrofera Blue next week. Still drainage on the medial aspect according to the intake nurse 05/08/16; still using  Santyl and Medihoney. Still a lot of drainage per her intake nurse. Patient has no complaints pain fever chills etc. 05/15/16 switched the Hydrofera Blue last week. Dimensions down especially in the medial right leg wound. Area on the lateral which is more substantial also looks better still requires debridement 05/22/16; we have been using Hydrofera Blue. 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 Currently he tells me that the pain is doing very well about a 1 out of 10 today. Yesterday was a little bit worse but he tells me that he was more active watering his flowers that day. Overall he feels that his symptoms are doing significantly better at this point in time. His edema continues to be controlled well with the 4-layer compression wrap and he really has not noted any odor at this point in time. He is tolerating the dressing changes when they are performed well. 06/05/16 at this point in time today patient currently shows no interval signs or symptoms of local or systemic infection.  Again his pain level he rates to be a 1 out of 10 at most and overall he tells me that generally this is not giving him much trouble. In fact he even feels maybe a little bit better than last week. We have continue with the 4-layer compression wrap in which she tolerates very well at this point. He is continuing to utilize the [place]. 06/12/16 I think there has been some progression in the status of both of these wounds over today again covered in a gelatinous surface. Has been using Hydrofera Blue. We had used Iodoflex in the past I'm not sure if there was an issue other than changing to something that might progress towards closure faster 06/19/16; he did not tolerate the Flexeril last week secondary to pain and this was  changed on Friday back to [facility] area he continues to have copious amounts of gelatinous surface slough which is think inhibiting the speed of healing this area 06/26/16 patient over the last week has utilized the Santyl to try to loosen up some of the tightly adherent slough that was noted on evaluation last week. The good news is he tells me that the medial malleoli region really does not bother him the right llateral malleoli region is more tender to palpation at this point in time especially in the central/inferior location. However it does appear that the Santyl has done his job to loosen up the adherent slough at this point in time. Fortunately he has no interval signs or symptoms of infection locally or systemically no purulent discharge noted. 07/03/16 at this point in time today patient's wounds appear to be significantly improved over the right medial and lateral malleolus locations. He has much less tenderness at this point in time and the wounds appear clean her although there is still adherent slough this is sufficiently improved over what I saw last week. I still see no evidence of local infection. 07/10/16; continued gradual improvement in the right medial and lateral malleolus locations. The lateral is more substantial wound now divided into 2 by a rim of normal epithelialization. 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 He denies any complaints of pain or intolerance to compression. Both ulcers are smaller in size oriented today's measurements, both are covered with a softly adherent slough. 07/24/16; medial wound is smaller, lateral about the same although surface looks better. Still using DAIMIEN, [name] ([id number]) Hydrofera Blue 07/31/16; arrives today complaining of pain in the lateral part of his foot. Nurse reports a lot more drainage. He  has been using Hydrofera Blue. Switch to silver alginate today 08/03/2016 -- I was asked to see the patient was here for a nurse visit today. I understand he had a lot of pain in his right lower extremity and was having blisters on his right foot which have not been there before. Though he started on doxycycline he does not have blisters elsewhere on his body. I do not believe this is a drug allergy. also mentioned that there was a copious purulent discharge from the wound and clinically there is no evidence of cellulitis. 08/07/16; I note that the patient came in for his nurse check on Friday apparently with blisters on his toes on the right than a lot of swelling in his forefoot. He continued on the doxycycline that I had prescribed on 12/8. A culture was done of the lateral wound  that showed a combination of a few Proteus and Pseudomonas. Doxycycline might of covered the Proteus but would be unlikely to cover the Pseudomonas. He is on Coumadin. He arrives in the clinic today feeling a lot better states the pain is a lot better but nothing specific really was done other than to rewrap the foot also noted that he had arterial studies ordered in August although these were never done. It is reasonable to go ahead and reorder these. 08/14/16; generally arrives in a better state today in terms of the wounds he has taken cefdinir for one week. Our intake nurse reports copious amounts of drainage but the patient is complaining of much less pain. He is not had his PT and INR checked and I've asked him to do this today or tomorrow. 08/24/2016 -- patient arrives today after 10 days and said he had a stomach upset. His arterial study was done and I have reviewed this report and find it to be within normal limits. However I did not note any venous duplex studies for reflux, and Dr. Leanord Hawkingobson may have ordered these in the past but I will leave it to him to decide if he needs these. The patient has finished  his course of cefdinir. 08/28/16; patient arrives today again with copious amounts of thick really green drainage for our intake nurse. He states he has a very tender spot at the superior part of the lateral wound. Wounds are larger 09/04/16; no real change in the condition of this patient's wound still copious amounts of surface slough. Started him on Iodoflex last week he is completing another course of Cefdinir or which I think was done empirically. His arterial study showed ABIs were 1.1 on the right 1.5 on the left. He did have a slightly reduced ABI in the right the left one was not obtained. Had calcification of the right posterior tibial artery. The interpretation was no segmental stenosis. His waveforms were triphasic. His reflux studies are later this month. Depending on this I'll send him for a vascular consultation, he may need to see plastic surgery as I believe he is had plastic surgery on this foot in the past. He had an injury to the foot in the 1980s. 1/16 /18 right lateral greater than right medial ankle wounds on the right in the setting of previous skin grafting. 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 He does not have arterial issues. Each week he comes in with the same adherent surface slough although there was less of this today 09/18/16; right lateral greater than right medial lower extremity wounds in the setting of previous skin grafting and trauma. He has least to vein laser ablation scheduled for February 2 for venous reflux. He does not have significant arterial disease. Problem has been very difficult to handle surface slough/necrotic tissue. Recently using Iodoflex for this with some, albeit slow improvement 09/25/16; right lateral greater than right medial lower extremity venous wounds in the setting of previous skin grafting. He is going for ablation surgery on February 2 after this  he'll come back here for rewrap. He has been using Iodoflex as the primary dressing. 10/02/16; right lateral greater than right medial lower extremity wounds in the setting of previous skin grafting. He had his ablation surgery last week, I don't have a report. He tolerated this well. Came in with a thigh- high Unna boots on Friday.  We have been using Iodoflex as the primary dressing. His measurements are improving 10/09/16; continues to make nice aggressive in terms of the wounds on his lateral and medial right ankle in the setting of previous skin grafting. Yesterday he noticed drainage at one of his surgical sites from his [name], [name]. ([id number]) venous ablation on the right calf. He took off the bandage over this area felt a "popping" sensation and a reddish-brown drainage. He is not complaining of any pain 10/16/16; he continues to make nice progression in terms of the wounds on the lateral and medial malleolus. Both smaller using Iodoflex. He had a surgical area in his posterior mid calf we have been using iodoform. All the wounds are down and dimensions 10/23/16; the patient arrives today with no complaints. He states the Iodoflex is a bit uncomfortable. He is not systemically unwell. We have been using Iodoflex to the lateral right ankle and the medial and Aquacel Ag to the reflux surgical wound on the posterior right calf. All of these wounds are doing well 10/30/16; patient states he has no pain no systemic symptoms. I changed him to [facility] last week. Although the wounds are doing well 11/06/16; patient reports no pain or systemic symptoms. We continue with Hydrofera Blue. Both wound areas on the medial and lateral ankle appear to be doing well with improvement and dimensions and improvement in the wound bed. 11/13/16; patient's dimensions continued to improve. We continue with Hydrofera Blue on the medial and lateral side. Appear to be doing well with healthy granulation and  advancing epithelialization 11/20/16; patient's dimensions improving laterally by about half a centimeter in length. Otherwise no change on the medial side. Using Hydrofera Blue Objective Constitutional Patient is hypertensive.. Pulse regular and within target range for patient.Marland Kitchen Respirations regular, non-labored and within target range.. Temperature is normal and within the target range for the patient.. Patient's appearance is neat and clean. Appears in no acute distress. Well nourished and well developed.. Vitals Time Taken: 8:02 AM, Height: 76 in, Weight: 238 lbs, BMI: 29, Temperature: 98.1 F, Pulse: 79 bpm, Respiratory Rate: 18 breaths/min, Blood Pressure: 167/77 mmHg. Eyes Conjunctivae clear. No discharge.Marland Kitchen Respiratory Respiratory effort is easy and symmetric bilaterally. Rate is normal at rest and on room air.. Cardiovascular Pedal pulses palpable and strong bilaterally.. Edema is well-controlled. Lymphatic None palpable in the right popliteal or inguinal area. Psychiatric No evidence of depression, anxiety, or agitation. Calm, cooperative, and communicative. Appropriate interactions and affect.Marland Kitchen [name], [name] ([id number]) General Notes: Wound exam; original large wound on the lateral aspect of the right ankle continues to look healthy. There is an [place] of skin in the middle of this long wound. Dimensions down half a centimeter prior intake nurse granulation looks healthy Absolutely no change in the medial aspect of the right ankle wound. Both areas appear to be slightly moist Integumentary (Hair, Skin) Wound #1 status is Open. Original cause of wound was Gradually Appeared. The wound is located on the Right,Lateral Malleolus. The wound measures 5.5cm length x 5cm width x 0.2cm depth; 21.598cm^2 area and 4.32cm^3 volume. The wound is limited to skin breakdown. There is no tunneling or undermining noted. There is a large amount of serosanguineous drainage noted. The wound  margin is distinct with the outline attached to the wound base. There is large (67-100%) red granulation within the wound bed. There is a small (1-33%) amount of necrotic tissue within the wound bed including Adherent Slough. Periwound temperature was noted as No Abnormality.  Wound #2 status is Open. Original cause of wound was Gradually Appeared. The wound is located on the Right,Medial Malleolus. The wound measures 2.5cm length x 1.5cm width x 0.2cm depth; 2.945cm^2 area and 0.589cm^3 volume. The wound is limited to skin breakdown. There is no tunneling or undermining noted. There is a large amount of serosanguineous drainage noted. The wound margin is distinct with the outline attached to the wound base. There is large (67-100%) red granulation within the wound bed. There is a small (1-33%) amount of necrotic tissue within the wound bed including Adherent Slough. The periwound skin appearance exhibited: Maceration. Periwound temperature was noted as No Abnormality. The periwound has tenderness on palpation. 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 No tub bath. Wound #2 Right,Medial Malleolus: Cleanse wound with mild soap and water May shower with protection. No tub bath. Anesthetic: Wound #1  Right,Lateral Malleolus: Topical Lidocaine 4% cream applied to wound bed prior to debridement Wound #2 Right,Medial Malleolus: Topical Lidocaine 4% cream applied to wound bed prior to debridement Skin Barriers/Peri-Wound Care: Wound #1 Right,Lateral Malleolus: Barrier cream - zinc oxide Triamcinolone Acetonide Ointment Primary Wound Dressing: Wound #1 Right,Lateral Malleolus: Hydrafera Blue Wound #2 Right,Medial Malleolus: Hydrafera Blue Secondary Dressing: Wound #1 Right,Lateral Malleolus: ABD pad Drawtex Wound #2 Right,Medial Malleolus: ABD pad Drawtex Dressing Change Frequency: Wound #1 Right,Lateral Malleolus: Change dressing every week Other: - nurse visit Friday Wound #2 Right,Medial Malleolus: Change dressing every week Other: - nurse visit Friday Follow-up Appointments: Wound #1 Right,Lateral Malleolus: Return Appointment in 1 week. Other: - nurse visit Friday Wound #2 Right,Medial Malleolus: Return Appointment in 1 week. Other: - nurse visit Friday Edema Control: Wound #1 Right,Lateral Malleolus: 3 Layer Compression System - Right Lower Extremity - UNNA TO ANCHOR Elevate legs to the level of the heart and pump ankles as often as possible Wound #2 Right,Medial Malleolus: 3 Layer Compression System - Right Lower Extremity - UNNA TO ANCHOR Elevate legs to the level of the heart and pump ankles as often as possible Additional Orders / Instructions: Wound #1 Right,Lateral Malleolus: Increase protein intake. [name], [name] ([id number]) OK to return to work with the following restrictions: Activity as tolerated Wound #2 Right,Medial Malleolus: Increase protein intake. OK to return to work with the following restrictions: Activity as tolerated #1 I'm going to continue the Hydrofera this week however if this continues this stalls consider changing next week. There may be a bit too much drainage here. No debridement is required today Electronic  Signature(s) Signed: 11/21/2016 7:58:45 AM By: [name] Najjar MD Entered By: [name] on 11/20/2016 08:32:09 [name] ([id number]) -------------------------------------------------------------------------------- SuperBill Details [name] Date of Service: 11/20/2016 Patient Name: C. Patient Account Number: [id number] Medical Record Treating RN: [name] [id number] Number: Other Clinician: Date of Birth/Sex: 26-May-1946 (71 y.o. 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, initial T85.613A encounter Disruption of external operation (surgical) wound, not elsewhere classified, subsequent T81.31XD encounter L97.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 of skin Facility Procedures CPT4: Description Modifier Quantity Code [id number] (Facility Use Only) [phone] - APPLY MULTLAY COMPRS LWR RT 1 LEG Physician Procedures CPT4: Description Modifier Quantity Code [id number] - WC PHYS LEVEL 3 - EST PT 1 ICD-10 Description Diagnosis I87.331 Chronic venous hypertension (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

## 2016-11-21 NOTE — Progress Notes (Signed)
[name] ([id number]) Visit Report for 11/20/2016 Arrival Information Details Patient Name: [name], [name]. Date of Service: 11/20/2016 8:00 AM Medical Record Number: [id number] Patient Account Number: [id number] Date of Birth/Sex: 1946-07-21 (71 y.o. Male) Treating RN: [name] Primary Care Elisa Sorlie: [facility] [place] Other Clinician: Referring Drew Lips: [facility] [place] Treating Chelse Matas/Extender: [place] in Treatment: 44 Visit Information History Since Last Visit All ordered tests and consults were completed: No Patient Arrived: Ambulatory Added or deleted any medications: No Arrival Time: 08:00 Any new allergies or adverse reactions: No Accompanied By: self Had a fall or experienced change in No Transfer Assistance: None activities of daily living that may affect Patient Identification Verified: Yes risk of falls: Secondary Verification Process Yes Signs or symptoms of abuse/neglect since last No Completed: visito Patient Requires Transmission-Based No Hospitalized since last visit: No Precautions: Has Dressing in Place as Prescribed: Yes Patient Has Alerts: No Has Compression in Place as Prescribed: Yes Pain Present Now: No Electronic Signature(s) Signed: 11/20/2016 5:34:36 PM By: Alejandro Mulling Entered By: Alejandro Mulling on 11/20/2016 08:01:02 [name] ([id number]) -------------------------------------------------------------------------------- Encounter Discharge Information Details Patient Name: [name] Date of Service: 11/20/2016 8:00 AM Medical Record Number: [id number] Patient Account Number: [id number] Date of Birth/Sex: 01-22-1946 (71 y.o. Male) Treating RN: [name] Primary Care Akyia Borelli: [facility] [place] Other Clinician: Referring Nadine Ryle: [facility] [place] Treating Paislyn Domenico/Extender: [name] Weeks in Treatment: 64 Encounter Discharge Information Items Discharge Pain Level: 0 Discharge  Condition: Stable Ambulatory Status: Ambulatory Discharge Destination: Home Transportation: Private Auto Accompanied By: self Schedule Follow-up Appointment: Yes Medication Reconciliation completed No and provided to Patient/Care Lavern Maslow: Patient Clinical Summary of Care: Declined Electronic Signature(s) Signed: 11/20/2016 8:36:48 AM By: Gwenlyn Perking Entered By: Gwenlyn Perking on 11/20/2016 08:36:48 [name] ([id number]) -------------------------------------------------------------------------------- Lower Extremity Assessment Details Patient Name: [name] Date of Service: 11/20/2016 8:00 AM Medical Record Number: [id number] Patient Account Number: [id number] Date of Birth/Sex: 09-15-1945 (71 y.o. Male) Treating RN: [name] Primary Care Demone Lyles: [facility] [place] Other Clinician: Referring Justen Fonda: [facility] [place] Treating Reeta Kuk/Extender: [name] Weeks in Treatment: 44 Edema Assessment Assessed: [Left: No] [Right: No] E[Left: dema] [Right: :] Calf Left: Right: Point of Measurement: 40 cm From Medial Instep cm 34.4 cm Ankle Left: Right: Point of Measurement: 12 cm From Medial Instep cm 22.1 cm Vascular Assessment Pulses: Dorsalis Pedis Palpable: [Right:Yes] Posterior Tibial Extremity colors, hair growth, and conditions: Extremity Color: [Right:Normal] Temperature of Extremity: [Right:Warm] Capillary Refill: [Right:< 3 seconds] Electronic Signature(s) Signed: 11/20/2016 5:34:36 PM By: Alejandro Mulling Entered By: Alejandro Mulling on 11/20/2016 08:17:37 [name] ([id number]) -------------------------------------------------------------------------------- Multi Wound Chart Details Patient Name: [name] Date of Service: 11/20/2016 8:00 AM Medical Record Number: [id number] Patient Account Number: [id number] Date of Birth/Sex: 1945-12-10 (71 y.o. Male) Treating RN: [name] Primary Care Chesley Veasey:  [facility] [place] Other Clinician: Referring Quin Mcpherson: [facility] [place] Treating Mennie Spiller/Extender: [name] Weeks in Treatment: 44 Vital Signs Height(in): 76 Pulse(bpm): 79 Weight(lbs): 238 Blood Pressure 167/77 (mmHg): Body Mass Index(BMI): 29 Temperature(F): 98.1 Respiratory Rate 18 (breaths/min): Photos: [1:No Photos] [2:No Photos] [N/A:N/A] Wound Location: [1:Right Malleolus - Lateral] [2:Right Malleolus - Medial] [N/A:N/A] Wounding Event: [1:Gradually Appeared] [2:Gradually Appeared] [N/A:N/A] Primary Etiology: [1:Venous Leg Ulcer] [2:Venous Leg Ulcer] [N/A:N/A] Comorbid History: [1:Cataracts, Chronic Obstructive Pulmonary Disease (COPD), Osteoarthritis] [2:Cataracts, Chronic Obstructive Pulmonary Disease (COPD), Osteoarthritis] [N/A:N/A] Date Acquired: [1:08/11/2015] [2:01/30/2016] [N/A:N/A] Weeks of Treatment: [1:44] [2:42] [N/A:N/A] Wound Status: [1:Open] [2:Open] [N/A:N/A] Clustered Wound: [1:No] [2:Yes] [N/A:N/A] Measurements L x W  x D 5.5x5x0.2 [2:2.5x1.5x0.2] [N/A:N/A] (cm) Area (cm) : [1:21.598] [2:2.945] [N/A:N/A] Volume (cm) : [1:4.32] [2:0.589] [N/A:N/A] % Reduction in Area: [1:-25.00%] [2:74.50%] [N/A:N/A] % Reduction in Volume: 16.70% [2:49.00%] [N/A:N/A] Classification: [1:Full Thickness Without Exposed Support Structures] [2:Full Thickness Without Exposed Support Structures] [N/A:N/A] Exudate Amount: [1:Large] [2:Large] [N/A:N/A] Exudate Type: [1:Serosanguineous] [2:Serosanguineous] [N/A:N/A] Exudate Color: [1:red, brown] [2:red, brown] [N/A:N/A] Wound Margin: [1:Distinct, outline attached] [2:Distinct, outline attached] [N/A:N/A] Granulation Amount: [1:Large (67-100%)] [2:Large (67-100%)] [N/A:N/A] Granulation Quality: [1:Red] [2:Red] [N/A:N/A] Necrotic Amount: [1:Small (1-33%)] [2:Small (1-33%)] [N/A:N/A] Exposed Structures: [1:Fascia: No Fat Layer (Subcutaneous] [2:Fascia: No Fat Layer (Subcutaneous] [N/A:N/A] Tissue) Exposed: No Tissue)  Exposed: No Tendon: No Tendon: No Muscle: No Muscle: No Joint: No Joint: No Bone: No Bone: No Limited to Skin Limited to Skin Breakdown Breakdown Epithelialization: Small (1-33%) Small (1-33%) N/A Periwound Skin Texture: No Abnormalities Noted No Abnormalities Noted N/A Periwound Skin No Abnormalities Noted Maceration: Yes N/A Moisture: Periwound Skin Color: No Abnormalities Noted No Abnormalities Noted N/A Temperature: No Abnormality No Abnormality N/A Tenderness on No Yes N/A Palpation: Wound Preparation: Ulcer Cleansing: Ulcer Cleansing: N/A Rinsed/Irrigated with Rinsed/Irrigated with Saline, Other: soap and Saline, Other: soap and water water Topical Anesthetic Topical Anesthetic Applied: Other: lidocaine Applied: Other: lidocaine 4% 4% Treatment Notes Wound #1 (Right, Lateral Malleolus) 1. Cleansed with: Clean wound with Normal Saline Cleanse wound with antibacterial soap and water 2. Anesthetic Topical Lidocaine 4% cream to wound bed prior to debridement 4. Dressing Applied: Hydrafera Blue 5. Secondary Dressing Applied ABD Pad 7. Secured with Tape 3 Layer Compression System - Right Lower Extremity Notes unna to anchor, drawtex Wound #2 (Right, Medial Malleolus) 1. Cleansed with: Clean wound with Normal Saline Cleanse wound with antibacterial soap and water 2. Anesthetic Topical Lidocaine 4% cream to wound bed prior to debridement 4. Dressing Applied: [name] StackBLACKWELL, Kwamaine C. ([id number]) Hydrafera Blue 5. Secondary Dressing Applied ABD Pad 7. Secured with Tape 3 Layer Compression System - Right Lower Extremity Notes unna to anchor, drawtex Electronic Signature(s) Signed: 11/21/2016 7:58:45 AM By: Baltazar Najjarobson, [name] MD Entered By: Baltazar Najjarobson, [name] on 11/20/2016 08:28:43 [name] StackBLACKWELL, Valentino C. ([id number]) -------------------------------------------------------------------------------- Multi-Disciplinary Care Plan Details Patient Name: [name] StackBLACKWELL, Tiquan  C. Date of Service: 11/20/2016 8:00 AM Medical Record Number: [id number] Patient Account Number: [id number] Date of Birth/Sex: [birth year](70 y.o. Male) Treating RN: Phillis HaggisPinkerton, [name] Primary Care Ronan Duecker: [place] Other Clinician: Referring Yaseen Gilberg: [facility] [place] Treating Peg Fifer/Extender: Maxwell CaulOBSON, [name] G Weeks in Treatment: 4844 Active Inactive ` Venous Leg Ulcer Nursing Diagnoses: Knowledge deficit related to disease process and management Goals: Patient will maintain optimal edema control Date Initiated: 04/03/2016 Target Resolution Date: 11/24/2016 Goal Status: Active Interventions: Compression as ordered Treatment Activities: Therapeutic compression applied : 04/03/2016 Notes: ` Wound/Skin Impairment Nursing Diagnoses: Impaired tissue integrity Goals: Ulcer/skin breakdown will heal within 14 weeks Date Initiated: 01/17/2016 Target Resolution Date: 11/24/2016 Goal Status: Active Interventions: Assess ulceration(s) every visit Notes: Electronic Signature(s) Signed: 11/20/2016 5:34:36 PM By: Aurelio JewPinkerton, Debra Damian, [name] ([id number]) Entered By: Alejandro MullingPinkerton, Debra on 11/20/2016 08:19:20 [name] StackBLACKWELL, Alben C. ([id number]) -------------------------------------------------------------------------------- Pain Assessment Details Patient Name: [name] StackBLACKWELL, Williard C. Date of Service: 11/20/2016 8:00 AM Medical Record Number: [id number] Patient Account Number: [id number] Date of Birth/Sex: [birth year](70 y.o. Male) Treating RN: Phillis HaggisPinkerton, [name] Primary Care Temesgen Weightman: [place] Other Clinician: Referring Burdett Pinzon: [facility] [place] Treating Myanna Ziesmer/Extender: Maxwell CaulOBSON, [name] G Weeks in Treatment: 44 Active Problems Location of Pain Severity and Description of Pain Patient Has Paino No Site Locations With Dressing Change: No Pain Management and Medication Current Pain Management:  Electronic Signature(s) Signed: 11/20/2016 5:34:36 PM By: Alejandro Mulling Entered By:  Alejandro Mulling on 11/20/2016 08:02:45 [name] ([id number]) -------------------------------------------------------------------------------- Patient/Caregiver Education Details [name] Date of Service: 11/20/2016 8:00 AM Patient Name: C. Patient Account Number: [id number] Medical Record Treating RN: [name] [id number] Number: Other Clinician: Date of Birth/Gender: 08/29/1945 (70 y.o. Male) Treating [name] Primary Care Physician: [place], [place] Physician/Extender: G Referring Physician: [facility] [place] Weeks in Treatment: 57 Education Assessment Education Provided To: Patient Education Topics Provided Wound/Skin Impairment: Handouts: Other: change dressing as ordered Methods: Demonstration, Explain/Verbal Responses: State content correctly Electronic Signature(s) Signed: 11/20/2016 5:34:36 PM By: Alejandro Mulling Entered By: Alejandro Mulling on 11/20/2016 08:20:10 [name] ([id number]) -------------------------------------------------------------------------------- Wound Assessment Details Patient Name: [name] Date of Service: 11/20/2016 8:00 AM Medical Record Number: [id number] Patient Account Number: [id number] Date of Birth/Sex: Oct 06, 1945 (71 y.o. Male) Treating RN: [name] Primary Care Gracelyn Coventry: [facility], [place] Other Clinician: Referring Rosali Augello: [facility] [place] Treating Delisha Peaden/Extender: [name] Weeks in Treatment: 44 Wound Status Wound Number: 1 Primary Venous Leg Ulcer Etiology: Wound Location: Right Malleolus - Lateral Wound Open Wounding Event: Gradually Appeared Status: Date Acquired: 08/11/2015 Comorbid Cataracts, Chronic Obstructive Weeks Of Treatment: 44 History: Pulmonary Disease (COPD), Clustered Wound: No Osteoarthritis Photos Photo Uploaded By: Alejandro Mulling on 11/20/2016 17:16:28 Wound Measurements Length: (cm) 5.5 Width: (cm) 5 Depth: (cm) 0.2 Area: (cm)  21.598 Volume: (cm) 4.32 % Reduction in Area: -25% % Reduction in Volume: 16.7% Epithelialization: Small (1-33%) Tunneling: No Undermining: No Wound Description Full Thickness Without Exposed Foul Odor Afte Classification: Support Structures Slough/Fibrino Wound Margin: Distinct, outline attached Exudate Large Amount: Exudate Type: Serosanguineous Exudate Color: red, brown r Cleansing: No Yes Wound Bed Granulation Amount: Large (67-100%) Exposed Structure Granulation Quality: Red Fascia Exposed: No Rallis, Mael C. ([id number]) Necrotic Amount: Small (1-33%) Fat Layer (Subcutaneous Tissue) Exposed: No Necrotic Quality: Adherent Slough Tendon Exposed: No Muscle Exposed: No Joint Exposed: No Bone Exposed: No Limited to Skin Breakdown Periwound Skin Texture Texture Color No Abnormalities Noted: No No Abnormalities Noted: No Moisture Temperature / Pain No Abnormalities Noted: No Temperature: No Abnormality Wound Preparation Ulcer Cleansing: Rinsed/Irrigated with Saline, Other: soap and water, Topical Anesthetic Applied: Other: lidocaine 4%, Treatment Notes Wound #1 (Right, Lateral Malleolus) 1. Cleansed with: Clean wound with Normal Saline Cleanse wound with antibacterial soap and water 2. Anesthetic Topical Lidocaine 4% cream to wound bed prior to debridement 4. Dressing Applied: Hydrafera Blue 5. Secondary Dressing Applied ABD Pad 7. Secured with Tape 3 Layer Compression System - Right Lower Extremity Notes unna to anchor, drawtex Electronic Signature(s) Signed: 11/20/2016 5:34:36 PM By: Alejandro Mulling Entered By: Alejandro Mulling on 11/20/2016 08:15:54 [name] ([id number]) -------------------------------------------------------------------------------- Wound Assessment Details Patient Name: [name] Date of Service: 11/20/2016 8:00 AM Medical Record Number: [id number] Patient Account Number: [id number] Date of Birth/Sex:  1945-09-18 (71 y.o. Male) Treating RN: [name] Primary Care Pearson Picou: [facility] [place] Other Clinician: Referring Iliani Vejar: [facility] [place] Treating Jniya Madara/Extender: [name] Weeks in Treatment: 44 Wound Status Wound Number: 2 Primary Venous Leg Ulcer Etiology: Wound Location: Right Malleolus - Medial Wound Open Wounding Event: Gradually Appeared Status: Date Acquired: 01/30/2016 Comorbid Cataracts, Chronic Obstructive Weeks Of Treatment: 42 History: Pulmonary Disease (COPD), Clustered Wound: Yes Osteoarthritis Photos Photo Uploaded By: Alejandro Mulling on 11/20/2016 17:16:28 Wound Measurements Length: (cm) 2.5 Width: (cm) 1.5 Depth: (cm) 0.2 Area: (cm) 2.945 Volume: (cm) 0.589 % Reduction in Area: 74.5% % Reduction in Volume: 49% Epithelialization: Small (1-33%) Tunneling: No  Undermining: No Wound Description Full Thickness Without Exposed Foul Odor Afte Classification: Support Structures Slough/Fibrino Wound Margin: Distinct, outline attached Exudate Large Amount: Exudate Type: Serosanguineous Exudate Color: red, brown r Cleansing: No Yes Wound Bed Granulation Amount: Large (67-100%) Exposed Structure Granulation Quality: Red Fascia Exposed: No Charley, Curtez C. ([id number]) Necrotic Amount: Small (1-33%) Fat Layer (Subcutaneous Tissue) Exposed: No Necrotic Quality: Adherent Slough Tendon Exposed: No Muscle Exposed: No Joint Exposed: No Bone Exposed: No Limited to Skin Breakdown Periwound Skin Texture Texture Color No Abnormalities Noted: No No Abnormalities Noted: No Moisture Temperature / Pain No Abnormalities Noted: No Temperature: No Abnormality Maceration: Yes Tenderness on Palpation: Yes Wound Preparation Ulcer Cleansing: Rinsed/Irrigated with Saline, Other: soap and water, Topical Anesthetic Applied: Other: lidocaine 4%, Treatment Notes Wound #2 (Right, Medial Malleolus) 1. Cleansed with: Clean wound with Normal  Saline Cleanse wound with antibacterial soap and water 2. Anesthetic Topical Lidocaine 4% cream to wound bed prior to debridement 4. Dressing Applied: Hydrafera Blue 5. Secondary Dressing Applied ABD Pad 7. Secured with Tape 3 Layer Compression System - Right Lower Extremity Notes unna to anchor, drawtex Electronic Signature(s) Signed: 11/20/2016 5:34:36 PM By: Alejandro Mulling Entered By: Alejandro Mulling on 11/20/2016 08:16:32 [name] ([id number]) -------------------------------------------------------------------------------- Vitals Details Patient Name: [name] Date of Service: 11/20/2016 8:00 AM Medical Record Number: [id number] Patient Account Number: [id number] Date of Birth/Sex: 1946-01-18 (71 y.o. Male) Treating RN: [name] Primary Care Mikie Misner: [facility] [place] Other Clinician: Referring Gary Bultman: [facility] [place] Treating Harrietta Incorvaia/Extender: [name] Weeks in Treatment: 44 Vital Signs Time Taken: 08:02 Temperature (F): 98.1 Height (in): 76 Pulse (bpm): 79 Weight (lbs): 238 Respiratory Rate (breaths/min): 18 Body Mass Index (BMI): 29 Blood Pressure (mmHg): 167/77 Reference Range: 80 - 120 mg / dl Electronic Signature(s) Signed: 11/20/2016 5:34:36 PM By: Alejandro Mulling Entered By: Alejandro Mulling on 11/20/2016 08:65:78

## 2016-11-23 DIAGNOSIS — I87331 Chronic venous hypertension (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: Secondary | ICD-10-CM | POA: Diagnosis not present

## 2016-11-26 NOTE — Progress Notes (Signed)
[name] ([id number]) Visit Report for 11/23/2016 Arrival Information Details Patient Name: [name]. Date of Service: 11/23/2016 8:00 AM Medical Record Number: [id number] Patient Account Number: [id number] Date of Birth/Sex: 09-03-1945 (71 y.o. Male) Treating RN: Ashok Cordia, [name] Primary Care Rasmus Preusser: [name] Other Clinician: Referring Danaria Larsen: [name] Treating Bern Fare/Extender: Rudene Re in Treatment: 49 Visit Information History Since Last Visit All ordered tests and consults were completed: No Patient Arrived: Ambulatory Added or deleted any medications: No Arrival Time: 08:08 Any new allergies or adverse reactions: No Accompanied By: self Had a fall or experienced change in No Transfer Assistance: None activities of daily living that may affect Patient Requires Transmission-Based No risk of falls: Precautions: Signs or symptoms of abuse/neglect since last No Patient Has Alerts: No visito Hospitalized since last visit: No Has Dressing in Place as Prescribed: Yes Has Compression in Place as Prescribed: Yes Pain Present Now: No Electronic Signature(s) Signed: 11/26/2016 4:13:19 PM By: Alejandro Mulling Entered By: Alejandro Mulling on 11/23/2016 08:08:38 [name] ([id number]) -------------------------------------------------------------------------------- Encounter Discharge Information Details Patient Name: [name] Date of Service: 11/23/2016 8:00 AM Medical Record Number: [id number] Patient Account Number: [id number] Date of Birth/Sex: 06/03/1946 (71 y.o. Male) Treating RN: [name] Primary Care Aneesha Holloran: [name] Other Clinician: Referring Mystery Schrupp: [name] Treating Kolten Ryback/Extender: Rudene Re in Treatment: 74 Encounter Discharge Information Items Discharge Pain Level: 0 Discharge Condition: Stable Ambulatory Status: Ambulatory Discharge Destination:  Home Private Transportation: Auto Accompanied By: self Schedule Follow-up Appointment: Yes Medication Reconciliation completed and No provided to Patient/Care Sheranda Seabrooks: Clinical Summary of Care: Electronic Signature(s) Signed: 11/26/2016 4:13:19 PM By: Alejandro Mulling Entered By: Alejandro Mulling on 11/23/2016 08:10:41 [name] ([id number]) -------------------------------------------------------------------------------- Patient/Caregiver Education Details Patient Name: [name] Date of Service: 11/23/2016 8:00 AM Medical Record Number: [id number] Patient Account Number: [id number] Date of Birth/Gender: 18-Mar-1946 (71 y.o. Male) Treating RN: [name] Primary Care Physician: [name] Other Clinician: Referring Physician: [name] Treating Physician/Extender: Rudene Re in Treatment: 35 Education Assessment Education Provided To: Patient Education Topics Provided Wound/Skin Impairment: Handouts: Other: do not get wraps wet Methods: Explain/Verbal Responses: State content correctly Electronic Signature(s) Signed: 11/26/2016 4:13:19 PM By: Alejandro Mulling Entered By: Alejandro Mulling on 11/23/2016 08:10:25 [name] ([id number]) -------------------------------------------------------------------------------- Wound Assessment Details Patient Name: [name] Date of Service: 11/23/2016 8:00 AM Medical Record Number: [id number] Patient Account Number: [id number] Date of Birth/Sex: Feb 14, 1946 (71 y.o. Male) Treating RN: Ashok Cordia, [name] Primary Care Mila Pair: [name] Other Clinician: Referring Gil Ingwersen: [name] Treating Makenleigh Crownover/Extender: Rudene Re in Treatment: 44 Wound Status Wound Number: 1 Primary Venous Leg Ulcer Etiology: Wound Location: Right Malleolus - Lateral Wound Open Wounding Event: Gradually Appeared Status: Date Acquired: 08/11/2015 Comorbid Cataracts, Chronic  Obstructive Weeks Of Treatment: 44 History: Pulmonary Disease (COPD), Clustered Wound: No Osteoarthritis Wound Measurements Length: (cm) 5.5 Width: (cm) 5 Depth: (cm) 0.2 Area: (cm) 21.598 Volume: (cm) 4.32 % Reduction in Area: -25% % Reduction in Volume: 16.7% Epithelialization: Small (1-33%) Tunneling: No Undermining: No Wound Description Full Thickness Without Exposed Classification: Support Structures Wound Margin: Distinct, outline attached Exudate Large Amount: Exudate Type: Serosanguineous Exudate Color: red, brown Foul Odor After Cleansing: No Slough/Fibrino Yes Wound Bed Granulation Amount: Large (67-100%) Exposed Structure Granulation Quality: Red Fascia Exposed: No Necrotic Amount: Small (1-33%) Fat Layer (Subcutaneous Tissue) Exposed: No Necrotic Quality: Adherent Slough Tendon Exposed: No Muscle Exposed: No Joint Exposed: No Bone Exposed: No Limited to Skin Breakdown Periwound Skin Texture Texture Color  No Abnormalities Noted: No No Abnormalities Noted: No Moisture Temperature / Pain Kichline, Rino C. ([id number]) No Abnormalities Noted: No Temperature: No Abnormality Wound Preparation Ulcer Cleansing: Rinsed/Irrigated with Saline, Other: soap and water, Topical Anesthetic Applied: None, Other: lidocaine 4%, Treatment Notes Wound #1 (Right, Lateral Malleolus) 1. Cleansed with: Clean wound with Normal Saline Cleanse wound with antibacterial soap and water 4. Dressing Applied: Hydrafera Blue 5. Secondary Dressing Applied ABD Pad 7. Secured with Tape 3 Layer Compression System - Right Lower Extremity Notes drawtex, unna to anchor Electronic Signature(s) Signed: 11/26/2016 4:13:19 PM By: Alejandro Mulling Entered By: Alejandro Mulling on 11/23/2016 08:09:11 [name] ([id number]) -------------------------------------------------------------------------------- Wound Assessment Details Patient Name: [name] Date  of Service: 11/23/2016 8:00 AM Medical Record Number: [id number] Patient Account Number: [id number] Date of Birth/Sex: [birth year] (71 y.o. Male) Treating RN: Ashok Cordia, [name] Primary Care Kairi Harshbarger: [name] Other Clinician: Referring Zvi Duplantis: [name] Treating Dariann Huckaba/Extender: Rudene Re in Treatment: 44 Wound Status Wound Number: 2 Primary Venous Leg Ulcer Etiology: Wound Location: Right Malleolus - Medial Wound Open Wounding Event: Gradually Appeared Status: Date Acquired: 01/30/2016 Comorbid Cataracts, Chronic Obstructive Weeks Of Treatment: 42 History: Pulmonary Disease (COPD), Clustered Wound: Yes Osteoarthritis Wound Measurements Length: (cm) 2.5 Width: (cm) 1.5 Depth: (cm) 0.2 Area: (cm) 2.945 Volume: (cm) 0.589 % Reduction in Area: 74.5% % Reduction in Volume: 49% Epithelialization: Small (1-33%) Tunneling: No Undermining: No Wound Description Full Thickness Without Exposed Classification: Support Structures Wound Margin: Distinct, outline attached Exudate Large Amount: Exudate Type: Serosanguineous Exudate Color: red, brown Foul Odor After Cleansing: No Slough/Fibrino Yes Wound Bed Granulation Amount: Large (67-100%) Exposed Structure Granulation Quality: Red Fascia Exposed: No Necrotic Amount: Small (1-33%) Fat Layer (Subcutaneous Tissue) Exposed: No Necrotic Quality: Adherent Slough Tendon Exposed: No Muscle Exposed: No Joint Exposed: No Bone Exposed: No Limited to Skin Breakdown Periwound Skin Texture Texture Color No Abnormalities Noted: No No Abnormalities Noted: No Moisture Temperature / Pain Hathaway, Doyce C. ([id number]) No Abnormalities Noted: No Temperature: No Abnormality Maceration: Yes Tenderness on Palpation: Yes Wound Preparation Ulcer Cleansing: Rinsed/Irrigated with Saline, Other: soap and water, Topical Anesthetic Applied: None, Other: lidocaine 4%, Treatment Notes Wound #2 (Right, Medial  Malleolus) 1. Cleansed with: Clean wound with Normal Saline Cleanse wound with antibacterial soap and water 4. Dressing Applied: Hydrafera Blue 5. Secondary Dressing Applied ABD Pad 7. Secured with Tape 3 Layer Compression System - Right Lower Extremity Notes drawtex, unna to anchor Electronic Signature(s) Signed: 11/26/2016 4:13:19 PM By: Alejandro Mulling Entered By: Alejandro Mulling on 11/23/2016 24:40:10

## 2016-11-27 ENCOUNTER — Encounter: Payer: Medicare HMO | Attending: Nurse Practitioner | Admitting: Nurse Practitioner

## 2016-11-27 DIAGNOSIS — Z7901 Long term (current) use of anticoagulants: Secondary | ICD-10-CM | POA: Diagnosis not present

## 2016-11-27 DIAGNOSIS — L97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 of skin: Secondary | ICD-10-CM | POA: Diagnosis not present

## 2016-11-27 DIAGNOSIS — Z86718 Personal history of other venous thrombosis and embolism: Secondary | ICD-10-CM | POA: Insufficient documentation

## 2016-11-27 DIAGNOSIS — T8131XD Disruption of external operation (surgical) wound, not elsewhere classified, subsequent encounter: Secondary | ICD-10-CM | POA: Insufficient documentation

## 2016-11-27 DIAGNOSIS — Y832 Surgical operation with anastomosis, bypass or graft as the cause of abnormal reaction of the patient, or of later complication, without mention of misadventure at the time of the procedure: Secondary | ICD-10-CM | POA: Insufficient documentation

## 2016-11-27 DIAGNOSIS — L97212 Non-pressure chronic ulcer of right calf with fat layer exposed: Secondary | ICD-10-CM | POA: Diagnosis not present

## 2016-11-27 DIAGNOSIS — I87331 Chronic venous hypertension (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: Secondary | ICD-10-CM | POA: Insufficient documentation

## 2016-11-27 DIAGNOSIS — T85613A 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, initial encounter: Secondary | ICD-10-CM | POA: Insufficient documentation
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
## 2016-11-28 NOTE — Progress Notes (Signed)
TOBIE, [name] ([id number]) Visit Report for 11/27/2016 Arrival Information Details Patient Name: [name], [name]. Date of Service: 11/27/2016 8:00 AM Medical Record Number: [id number] Patient Account Number: [id number] Date of Birth/Sex: 1946-05-25 (71 y.o. Male) Treating RN: [name] Primary Care Reve Crocket: [name] Other Clinician: Referring Rufina Kimery: [facility], [facility] Treating Lorn Butcher/Extender: Kathreen Cosier in Treatment: 45 Visit Information History Since Last Visit All ordered tests and consults were completed: No Patient Arrived: Ambulatory Added or deleted any medications: No Arrival Time: 08:01 Any new allergies or adverse reactions: No Accompanied By: self Had a fall or experienced change in No Transfer Assistance: None activities of daily living that may affect Patient Identification Verified: Yes risk of falls: Secondary Verification Process Yes Signs or symptoms of abuse/neglect since last No Completed: visito Patient Requires Transmission-Based No Hospitalized since last visit: No Precautions: Has Dressing in Place as Prescribed: Yes Patient Has Alerts: No Has Compression in Place as Prescribed: Yes Pain Present Now: No Electronic Signature(s) Signed: 11/27/2016 5:40:47 PM By: Alejandro Mulling Entered By: Alejandro Mulling on 11/27/2016 08:02:32 [name] ([id number]) -------------------------------------------------------------------------------- Encounter Discharge Information Details Patient Name: [name] Date of Service: 11/27/2016 8:00 AM Medical Record Number: [id number] Patient Account Number: [id number] Date of Birth/Sex: March 05, 1946 (72 y.o. Male) Treating RN: [name] Primary Care Pansy Ostrovsky: [name] Other Clinician: Referring Ruqaya Strauss: [facility] Treating Kycen Spalla/Extender: Kathreen Cosier in Treatment: 8 Encounter Discharge Information Items Discharge Pain Level: 0 Discharge Condition:  Stable Ambulatory Status: Ambulatory Discharge Destination: Home Transportation: Private Auto Accompanied By: self Schedule Follow-up Appointment: Yes Medication Reconciliation completed No and provided to Patient/Care Terrisa Curfman: Patient Clinical Summary of Care: Declined Electronic Signature(s) Signed: 11/27/2016 8:46:54 AM By: Gwenlyn Perking Entered By: Gwenlyn Perking on 11/27/2016 08:46:54 [name] ([id number]) -------------------------------------------------------------------------------- Lower Extremity Assessment Details Patient Name: [name] Date of Service: 11/27/2016 8:00 AM Medical Record Number: [id number] Patient Account Number: [id number] Date of Birth/Sex: 03-Nov-1945 (71 y.o. Male) Treating RN: [name] Primary Care Kymari Nuon: [name] Other Clinician: Referring Kayliee Atienza: [facility] Treating Iram Lundberg/Extender: Kathreen Cosier in Treatment: 45 Edema Assessment Assessed: [Left: No] [Right: No] E[Left: dema] [Right: :] Calf Left: Right: Point of Measurement: 40 cm From Medial Instep cm 34.4 cm Ankle Left: Right: Point of Measurement: 12 cm From Medial Instep cm 22.1 cm Vascular Assessment Pulses: Dorsalis Pedis Palpable: [Right:Yes] Posterior Tibial Extremity colors, hair growth, and conditions: Extremity Color: [Right:Normal] Temperature of Extremity: [Right:Warm] Capillary Refill: [Right:< 3 seconds] Electronic Signature(s) Signed: 11/27/2016 5:40:47 PM By: Alejandro Mulling Entered By: Alejandro Mulling on 11/27/2016 08:16:43 [name] ([id number]) -------------------------------------------------------------------------------- Multi Wound Chart Details Patient Name: [name] Date of Service: 11/27/2016 8:00 AM Medical Record Number: [id number] Patient Account Number: [id number] Date of Birth/Sex: [birth year] (71 y.o. Male) Treating RN: [name] Primary Care Braven Wolk: [name] Other  Clinician: Referring Tyasia Packard: [facility] Treating Ojas Coone/Extender: Kathreen Cosier in Treatment: 45 Vital Signs Height(in): 76 Pulse(bpm): 80 Weight(lbs): 238 Blood Pressure 154/66 (mmHg): Body Mass Index(BMI): 29 Temperature(F): 97.5 Respiratory Rate 18 (breaths/min): Photos: [1:No Photos] [2:No Photos] [N/A:N/A] Wound Location: [1:Right Malleolus - Lateral] [2:Right Malleolus - Medial] [N/A:N/A] Wounding Event: [1:Gradually Appeared] [2:Gradually Appeared] [N/A:N/A] Primary Etiology: [1:Venous Leg Ulcer] [2:Venous Leg Ulcer] [N/A:N/A] Comorbid History: [1:Cataracts, Chronic Obstructive Pulmonary Disease (COPD), Osteoarthritis] [2:Cataracts, Chronic Obstructive Pulmonary Disease (COPD), Osteoarthritis] [N/A:N/A] Date Acquired: [1:08/11/2015] [2:01/30/2016] [N/A:N/A] Weeks of Treatment: [1:45] [2:43] [N/A:N/A] Wound Status: [1:Open] [2:Open] [N/A:N/A] Clustered Wound: [1:No] [2:Yes] [N/A:N/A] Measurements L x W x D 5.5x5x0.2 [2:2.5x1.5x0.2] [  N/A:N/A] (cm) Area (cm) : [1:21.598] [2:2.945] [N/A:N/A] Volume (cm) : [1:4.32] [2:0.589] [N/A:N/A] % Reduction in Area: [1:-25.00%] [2:74.50%] [N/A:N/A] % Reduction in Volume: 16.70% [2:49.00%] [N/A:N/A] Classification: [1:Full Thickness Without Exposed Support Structures] [2:Full Thickness Without Exposed Support Structures] [N/A:N/A] Exudate Amount: [1:Large] [2:Large] [N/A:N/A] Exudate Type: [1:Serosanguineous] [2:Serosanguineous] [N/A:N/A] Exudate Color: [1:red, brown] [2:red, brown] [N/A:N/A] Wound Margin: [1:Distinct, outline attached] [2:Distinct, outline attached] [N/A:N/A] Granulation Amount: [1:Medium (34-66%)] [2:Large (67-100%)] [N/A:N/A] Granulation Quality: [1:Red] [2:Red] [N/A:N/A] Necrotic Amount: [1:Medium (34-66%)] [2:Small (1-33%)] [N/A:N/A] Exposed Structures: [1:Fascia: No Fat Layer (Subcutaneous] [2:Fascia: No Fat Layer (Subcutaneous] [N/A:N/A] Tissue) Exposed: No Tissue) Exposed: No Tendon:  No Tendon: No Muscle: No Muscle: No Joint: No Joint: No Bone: No Bone: No Limited to Skin Limited to Skin Breakdown Breakdown Epithelialization: Small (1-33%) Small (1-33%) N/A Debridement: Debridement (16109- Open Wound/Selective N/A 11047) ([id number]) - Selective Pre-procedure 08:28 08:28 N/A Verification/Time Out Taken: Pain Control: Lidocaine 4% Topical Lidocaine 4% Topical N/A Solution Solution Tissue Debrided: Fibrin/Slough, Fibrin/Slough, Exudates N/A Subcutaneous Level: Skin/Subcutaneous Skin/Dermis N/A Tissue Debridement Area (sq 27.5 3.75 N/A cm): Instrument: Curette Curette N/A Bleeding: Minimum Minimum N/A Hemostasis Achieved: Pressure Pressure N/A Procedural Pain: 0 0 N/A Post Procedural Pain: 0 0 N/A Debridement Treatment Procedure was tolerated Procedure was tolerated N/A Response: well well Post Debridement 5.5x5x0.2 2.5x1.5x0.2 N/A Measurements L x W x D (cm) Post Debridement 4.32 0.589 N/A Volume: (cm) Periwound Skin Texture: No Abnormalities Noted No Abnormalities Noted N/A Periwound Skin Maceration: Yes Maceration: Yes N/A Moisture: Periwound Skin Color: No Abnormalities Noted No Abnormalities Noted N/A Temperature: No Abnormality No Abnormality N/A Tenderness on No Yes N/A Palpation: Wound Preparation: Ulcer Cleansing: Ulcer Cleansing: N/A Rinsed/Irrigated with Rinsed/Irrigated with Saline, Other: soap and Saline, Other: soap and water water Topical Anesthetic Topical Anesthetic Applied: Other: lidocaine Applied: Other: lidocaine 4% 4% Procedures Performed: Debridement Debridement N/A CONSTANTINOS, KREMPASKY. ([id number]) Treatment Notes Wound #1 (Right, Lateral Malleolus) 1. Cleansed with: Clean wound with Normal Saline Cleanse wound with antibacterial soap and water 2. Anesthetic Topical Lidocaine 4% cream to wound bed prior to debridement 4. Dressing Applied: Hydrafera Blue 5. Secondary Dressing Applied ABD Pad 7. Secured  with Tape 3 Layer Compression System - Right Lower Extremity Notes drawtex Wound #2 (Right, Medial Malleolus) 1. Cleansed with: Clean wound with Normal Saline Cleanse wound with antibacterial soap and water 2. Anesthetic Topical Lidocaine 4% cream to wound bed prior to debridement 4. Dressing Applied: Hydrafera Blue 5. Secondary Dressing Applied ABD Pad 7. Secured with Tape 3 Layer Compression System - Right Lower Extremity Notes drawtex Electronic Signature(s) Signed: 11/27/2016 9:03:00 AM By: Bonnell Public Entered By: Bonnell Public on 11/27/2016 09:03:00 [name] ([id number]) -------------------------------------------------------------------------------- Multi-Disciplinary Care Plan Details Patient Name: [name] Date of Service: 11/27/2016 8:00 AM Medical Record Number: [id number] Patient Account Number: [id number] Date of Birth/Sex: [birth year] (71 y.o. Male) Treating RN: [name] Primary Care Eain Mullendore: [name] Other Clinician: Referring Torrie Lafavor: [facility] Treating Chaselynn Kepple/Extender: Kathreen Cosier in Treatment: 35 Active Inactive ` Venous Leg Ulcer Nursing Diagnoses: Knowledge deficit related to disease process and management Goals: Patient will maintain optimal edema control Date Initiated: 04/03/2016 Target Resolution Date: 11/24/2016 Goal Status: Active Interventions: Compression as ordered Treatment Activities: Therapeutic compression applied : 04/03/2016 Notes: ` Wound/Skin Impairment Nursing Diagnoses: Impaired tissue integrity Goals: Ulcer/skin breakdown will heal within 14 weeks Date Initiated: 01/17/2016 Target Resolution Date: 11/24/2016 Goal Status: Active Interventions: Assess ulceration(s) every visit Notes: Electronic Signature(s) Signed: 11/27/2016 5:40:47 PM By: Aurelio Jew, Derryl Harbor  C. ([id number]) Entered By: Alejandro Mulling on 11/27/2016 08:17:55 [name]  ([id number]) -------------------------------------------------------------------------------- Pain Assessment Details Patient Name: SHANE, BADEAUX Date of Service: 11/27/2016 8:00 AM Medical Record Number: [id number] Patient Account Number: [id number] Date of Birth/Sex: 05-Mar-1946 (71 y.o. Male) Treating RN: [name] Primary Care Berdia Lachman: [name] Other Clinician: Referring Elbridge Magowan: [facility] Treating Yohannes Waibel/Extender: Kathreen Cosier in Treatment: 45 Active Problems Location of Pain Severity and Description of Pain Patient Has Paino No Site Locations With Dressing Change: No Pain Management and Medication Current Pain Management: Electronic Signature(s) Signed: 11/27/2016 5:40:47 PM By: Alejandro Mulling Entered By: Alejandro Mulling on 11/27/2016 08:02:39 [name] ([id number]) -------------------------------------------------------------------------------- Patient/Caregiver Education Details Patient Name: [name] Date of Service: 11/27/2016 8:00 AM Medical Record Number: [id number] Patient Account Number: [id number] Date of Birth/Gender: September 02, 1945 (71 y.o. Male) Treating RN: [name] Primary Care Physician: [name] Other Clinician: Referring Physician: [facility], [facility]OTT Treating Physician/Extender: Kathreen Cosier in Treatment: 16 Education Assessment Education Provided To: Patient Education Topics Provided Wound/Skin Impairment: Handouts: Other: keep wrap clean and dry Methods: Demonstration, Explain/Verbal Responses: State content correctly Electronic Signature(s) Signed: 11/27/2016 5:40:47 PM By: Alejandro Mulling Entered By: Alejandro Mulling on 11/27/2016 08:23:44 [name] ([id number]) -------------------------------------------------------------------------------- Wound Assessment Details Patient Name: [name] Date of Service: 11/27/2016 8:00 AM Medical Record Number: [id number] Patient  Account Number: [id number] Date of Birth/Sex: November 17, 1945 (71 y.o. Male) Treating RN: [name] Primary Care Artist Bloom: [name] Other Clinician: Referring Derriona Branscom: [facility], [facility] Treating Bahar Shelden/Extender: Kathreen Cosier in Treatment: 45 Wound Status Wound Number: 1 Primary Venous Leg Ulcer Etiology: Wound Location: Right Malleolus - Lateral Wound Open Wounding Event: Gradually Appeared Status: Date Acquired: 08/11/2015 Comorbid Cataracts, Chronic Obstructive Weeks Of Treatment: 45 History: Pulmonary Disease (COPD), Clustered Wound: No Osteoarthritis Photos Photo Uploaded By: Alejandro Mulling on 11/27/2016 11:41:01 Wound Measurements Length: (cm) 5.5 Width: (cm) 5 Depth: (cm) 0.2 Area: (cm) 21.598 Volume: (cm) 4.32 % Reduction in Area: -25% % Reduction in Volume: 16.7% Epithelialization: Small (1-33%) Tunneling: No Undermining: No Wound Description Full Thickness Without Exposed Foul Odor Afte Classification: Support Structures Slough/Fibrino Wound Margin: Distinct, outline attached Exudate Large Amount: Exudate Type: Serosanguineous Exudate Color: red, brown r Cleansing: No Yes Wound Bed Granulation Amount: Medium (34-66%) Exposed Structure Granulation Quality: Red Fascia Exposed: No [place], Ernestine C. ([id number]) Necrotic Amount: Medium (34-66%) Fat Layer (Subcutaneous Tissue) Exposed: No Necrotic Quality: Adherent Slough Tendon Exposed: No Muscle Exposed: No Joint Exposed: No Bone Exposed: No Limited to Skin Breakdown Periwound Skin Texture Texture Color No Abnormalities Noted: No No Abnormalities Noted: No Moisture Temperature / Pain No Abnormalities Noted: No Temperature: No Abnormality Maceration: Yes Wound Preparation Ulcer Cleansing: Rinsed/Irrigated with Saline, Other: soap and water, Topical Anesthetic Applied: Other: lidocaine 4%, Treatment Notes Wound #1 (Right, Lateral Malleolus) 1. Cleansed with: Clean wound  with Normal Saline Cleanse wound with antibacterial soap and water 2. Anesthetic Topical Lidocaine 4% cream to wound bed prior to debridement 4. Dressing Applied: Hydrafera Blue 5. Secondary Dressing Applied ABD Pad 7. Secured with Tape 3 Layer Compression System - Right Lower Extremity Notes drawtex Electronic Signature(s) Signed: 11/27/2016 5:40:47 PM By: Alejandro Mulling Entered By: Alejandro Mulling on 11/27/2016 08:15:15 [name] ([id number]) -------------------------------------------------------------------------------- Wound Assessment Details Patient Name: [name] Date of Service: 11/27/2016 8:00 AM Medical Record Number: [id number] Patient Account Number: [id number] Date of Birth/Sex: 02-24-1946 (72 y.o. Male) Treating RN: [name] Primary Care Marq Rebello: [name] Other Clinician: Referring Michel Eskelson: [facility], [facility] Treating  Daquawn Seelman/Extender: Bonnell Public Weeks in Treatment: 45 Wound Status Wound Number: 2 Primary Venous Leg Ulcer Etiology: Wound Location: Right Malleolus - Medial Wound Open Wounding Event: Gradually Appeared Status: Date Acquired: 01/30/2016 Comorbid Cataracts, Chronic Obstructive Weeks Of Treatment: 43 History: Pulmonary Disease (COPD), Clustered Wound: Yes Osteoarthritis Photos Photo Uploaded By: Alejandro Mulling on 11/27/2016 11:41:34 Wound Measurements Length: (cm) 2.5 Width: (cm) 1.5 Depth: (cm) 0.2 Area: (cm) 2.945 Volume: (cm) 0.589 % Reduction in Area: 74.5% % Reduction in Volume: 49% Epithelialization: Small (1-33%) Tunneling: No Undermining: No Wound Description Full Thickness Without Exposed Foul Odor Afte Classification: Support Structures Slough/Fibrino Wound Margin: Distinct, outline attached Exudate Large Amount: Exudate Type: Serosanguineous Exudate Color: red, brown r Cleansing: No Yes Wound Bed Granulation Amount: Large (67-100%) Exposed Structure Granulation Quality:  Red Fascia Exposed: No Tumminello, Easton C. ([id number]) Necrotic Amount: Small (1-33%) Fat Layer (Subcutaneous Tissue) Exposed: No Necrotic Quality: Adherent Slough Tendon Exposed: No Muscle Exposed: No Joint Exposed: No Bone Exposed: No Limited to Skin Breakdown Periwound Skin Texture Texture Color No Abnormalities Noted: No No Abnormalities Noted: No Moisture Temperature / Pain No Abnormalities Noted: No Temperature: No Abnormality Maceration: Yes Tenderness on Palpation: Yes Wound Preparation Ulcer Cleansing: Rinsed/Irrigated with Saline, Other: soap and water, Topical Anesthetic Applied: Other: lidocaine 4%, Treatment Notes Wound #2 (Right, Medial Malleolus) 1. Cleansed with: Clean wound with Normal Saline Cleanse wound with antibacterial soap and water 2. Anesthetic Topical Lidocaine 4% cream to wound bed prior to debridement 4. Dressing Applied: Hydrafera Blue 5. Secondary Dressing Applied ABD Pad 7. Secured with Tape 3 Layer Compression System - Right Lower Extremity Notes drawtex Electronic Signature(s) Signed: 11/27/2016 5:40:47 PM By: Alejandro Mulling Entered By: Alejandro Mulling on 11/27/2016 08:16:18 [name] ([id number]) -------------------------------------------------------------------------------- Vitals Details Patient Name: [name] Date of Service: 11/27/2016 8:00 AM Medical Record Number: [id number] Patient Account Number: [id number] Date of Birth/Sex: Dec 29, 1945 (71 y.o. Male) Treating RN: Ashok Cordia, [name] Primary Care Marna Weniger: [name] Other Clinician: Referring Cesiah Westley: [facility], [facility] Treating Talana Slatten/Extender: Kathreen Cosier in Treatment: 45 Vital Signs Time Taken: 08:02 Temperature (F): 97.5 Height (in): 76 Pulse (bpm): 80 Weight (lbs): 238 Respiratory Rate (breaths/min): 18 Body Mass Index (BMI): 29 Blood Pressure (mmHg): 154/66 Reference Range: 80 - 120 mg / dl Electronic Signature(s) Signed:  11/27/2016 5:40:47 PM By: Alejandro Mulling Entered By: Alejandro Mulling on 11/27/2016 08:02:57

## 2016-11-30 DIAGNOSIS — I87331 Chronic venous hypertension (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: Secondary | ICD-10-CM | POA: Diagnosis not present

## 2016-12-01 NOTE — Progress Notes (Signed)
BRAY, VICKERMAN ([id number]) Visit Report for 11/30/2016 Arrival Information Details Patient Name: [name]. Date of Service: 11/30/2016 8:00 AM Medical Record Number: [id number] Patient Account Number: [id number] Date of Birth/Sex: [birth year] (71 y.o. Male) Treating RN: [name] Primary Care [name]: Darreld [name] Other Clinician: Referring [name]: Darreld [name] Treating Genevieve Ritzel/Extender: [name] in Treatment: 45 Visit Information History Since Last Visit All ordered tests and consults were completed: No Patient Arrived: Ambulatory Added or deleted any medications: No Arrival Time: 08:02 Any new allergies or adverse reactions: No Accompanied By: self Had a fall or experienced change in No Transfer Assistance: None activities of daily living that may affect Patient Identification Verified: Yes risk of falls: Secondary Verification Process Yes Signs or symptoms of abuse/neglect since last No Completed: visito Patient Requires Transmission-Based No Hospitalized since last visit: No Precautions: Has Dressing in Place as Prescribed: Yes Patient Has Alerts: No Has Compression in Place as Prescribed: Yes Pain Present Now: No Electronic Signature(s) Signed: 11/30/2016 4:32:27 PM By: [name] Entered By: [name] on 11/30/2016 08:07:22 [name] ([id number]) -------------------------------------------------------------------------------- Encounter Discharge Information Details Patient Name: [name] Date of Service: 11/30/2016 8:00 AM Medical Record Number: [id number] Patient Account Number: [id number] Date of Birth/Sex: Apr 19, 1946 (71 y.o. Male) Treating RN: [name] Primary Care Rosia Syme: Darreld [name] Other Clinician: Referring [name]: Darreld [name] Treating Kalanie Fewell/Extender: [name] in Treatment: 49 Encounter Discharge Information Items Discharge Pain Level: 0 Discharge Condition:  Stable Ambulatory Status: Ambulatory Discharge Destination: Home Private Transportation: Auto Accompanied By: self Schedule Follow-up Appointment: Yes Medication Reconciliation completed and No provided to Patient/Care [name]: Clinical Summary of Care: Electronic Signature(s) Signed: 11/30/2016 4:32:27 PM By: [name] Entered By: [name] on 11/30/2016 08:09:13 [name] ([id number]) -------------------------------------------------------------------------------- Patient/Caregiver Education Details Patient Name: [name] Date of Service: 11/30/2016 8:00 AM Medical Record Number: [id number] Patient Account Number: [id number] Date of Birth/Gender: 1945/11/05 (71 y.o. Male) Treating RN: [name] Primary Care Physician: Darreld [name] Other Clinician: Referring Physician: Darreld [name] Treating Physician/Extender: [name] in Treatment: 20 Education Assessment Education Provided To: Patient Education Topics Provided Wound/Skin Impairment: Handouts: Other: do not get wrap wet Methods: Demonstration, Explain/Verbal Responses: State content correctly Electronic Signature(s) Signed: 11/30/2016 4:32:27 PM By: [name] Entered By: [name] on 11/30/2016 08:09:00 [name] ([id number]) -------------------------------------------------------------------------------- Wound Assessment Details Patient Name: [name] Date of Service: 11/30/2016 8:00 AM Medical Record Number: [id number] Patient Account Number: [id number] Date of Birth/Sex: [birth year] (71 y.o. Male) Treating RN: [name] Primary Care [name]: Darreld [name] Other Clinician: Referring [name]: Darreld [name] Treating [name]/Extender: [name] in Treatment: 45 Wound Status Wound Number: 1 Primary Venous Leg Ulcer Etiology: Wound Location: Right Malleolus - Lateral Wound Open Wounding Event: Gradually  Appeared Status: Date Acquired: 08/11/2015 Comorbid Cataracts, Chronic Obstructive Weeks Of Treatment: 45 History: Pulmonary Disease (COPD), Clustered Wound: No Osteoarthritis Wound Measurements Length: (cm) 5.5 Width: (cm) 5 Depth: (cm) 0.2 Area: (cm) 21.598 Volume: (cm) 4.32 % Reduction in Area: -25% % Reduction in Volume: 16.7% Epithelialization: Small (1-33%) Tunneling: No Undermining: No Wound Description Full Thickness Without Exposed Classification: Support Structures Wound Margin: Distinct, outline attached Exudate Large Amount: Exudate Type: Serosanguineous Exudate Color: red, brown Foul Odor After Cleansing: No Slough/Fibrino Yes Wound Bed Granulation Amount: Medium (34-66%) Exposed Structure Granulation Quality: Red Fascia Exposed: No Necrotic Amount: Medium (34-66%) Fat Layer (Subcutaneous Tissue) Exposed: No Necrotic Quality: Adherent Slough Tendon Exposed: No Muscle Exposed: No Joint Exposed: No Bone Exposed:  No Limited to Skin Breakdown Periwound Skin Texture Texture Color No Abnormalities Noted: No No Abnormalities Noted: No Moisture Temperature / Pain Study, [name] No Abnormalities Noted: No Temperature: No Abnormality Maceration: Yes Wound Preparation Ulcer Cleansing: Rinsed/Irrigated with Saline, Other: soap and water, Topical Anesthetic Applied: None, Other: lidocaine 4%, Treatment Notes Wound #1 (Right, Lateral Malleolus) 1. Cleansed with: Clean wound with Normal Saline Cleanse wound with antibacterial soap and water 4. Dressing Applied: Hydrafera Blue 5. Secondary Dressing Applied ABD Pad 7. Secured with Tape 3 Layer Compression System - Right Lower Extremity Notes drawtex Electronic Signature(s) Signed: 11/30/2016 4:32:27 PM By: [name] Entered By: [name] on 11/30/2016 08:07:44 [name]  ([id number]) -------------------------------------------------------------------------------- Wound Assessment Details Patient Name: [name] Date of Service: 11/30/2016 8:00 AM Medical Record Number: [id number] Patient Account Number: [id number] Date of Birth/Sex: 04/07/1946 (71 y.o. Male) Treating RN: [name] Primary Care Alila Sotero: Darreld [name] Other Clinician: Referring Evolette Pendell: Darreld [name] Treating Deaun Rocha/Extender: [name] in Treatment: 45 Wound Status Wound Number: 2 Primary Venous Leg Ulcer Etiology: Wound Location: Right Malleolus - Medial Wound Open Wounding Event: Gradually Appeared Status: Date Acquired: 01/30/2016 Comorbid Cataracts, Chronic Obstructive Weeks Of Treatment: 43 History: Pulmonary Disease (COPD), Clustered Wound: Yes Osteoarthritis Wound Measurements Length: (cm) 2.5 Width: (cm) 1.5 Depth: (cm) 0.2 Area: (cm) 2.945 Volume: (cm) 0.589 % Reduction in Area: 74.5% % Reduction in Volume: 49% Epithelialization: Small (1-33%) Tunneling: No Undermining: No Wound Description Full Thickness Without Exposed Classification: Support Structures Wound Margin: Distinct, outline attached Exudate Large Amount: Exudate Type: Serosanguineous Exudate Color: red, brown Foul Odor After Cleansing: No Slough/Fibrino Yes Wound Bed Granulation Amount: Large (67-100%) Exposed Structure Granulation Quality: Red Fascia Exposed: No Necrotic Amount: Small (1-33%) Fat Layer (Subcutaneous Tissue) Exposed: No Necrotic Quality: Adherent Slough Tendon Exposed: No Muscle Exposed: No Joint Exposed: No Bone Exposed: No Limited to Skin Breakdown Periwound Skin Texture Texture Color No Abnormalities Noted: No No Abnormalities Noted: No Moisture Temperature / Pain Petz, [name] ([id number]) No Abnormalities Noted: No Temperature: No Abnormality Maceration: Yes Tenderness on Palpation: Yes Wound Preparation Ulcer  Cleansing: Rinsed/Irrigated with Saline, Other: soap and water, Topical Anesthetic Applied: None, Other: lidocaine 4%, Treatment Notes Wound #2 (Right, Medial Malleolus) 1. Cleansed with: Clean wound with Normal Saline Cleanse wound with antibacterial soap and water 4. Dressing Applied: Hydrafera Blue 5. Secondary Dressing Applied ABD Pad 7. Secured with Tape 3 Layer Compression System - Right Lower Extremity Notes drawtex Electronic Signature(s) Signed: 11/30/2016 4:32:27 PM By: [name] Entered By: [name] on 11/30/2016 24:40:10

## 2016-12-04 ENCOUNTER — Encounter: Payer: Medicare HMO | Admitting: Internal Medicine

## 2016-12-04 DIAGNOSIS — I87331 Chronic venous hypertension (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: Secondary | ICD-10-CM | POA: Diagnosis not present

## 2016-12-06 NOTE — Progress Notes (Signed)
[name], [name] ([id number]) Visit Report for 12/04/2016 Arrival Information Details Patient Name: [name], [name]. Date of Service: 12/04/2016 8:00 AM Medical Record Number: [id number] Patient Account Number: [id number] Date of Birth/Sex: [birth year] (71 y.o. Male) Treating RN: [name] Primary Care Tyrone Balash: [name] Other Clinician: Referring Noell Lorensen: [name] Treating Triton Heidrich/Extender: Altamese [place] in Treatment: 46 Visit Information History Since Last Visit All ordered tests and consults were completed: No Patient Arrived: Ambulatory Added or deleted any medications: No Arrival Time: 08:02 Any new allergies or adverse reactions: No Accompanied By: self Had a fall or experienced change in No Transfer Assistance: None activities of daily living that may affect Patient Identification Verified: Yes risk of falls: Secondary Verification Process Yes Signs or symptoms of abuse/neglect since last No Completed: visito Patient Requires Transmission-Based No Hospitalized since last visit: No Precautions: Has Dressing in Place as Prescribed: Yes Patient Has Alerts: No Has Compression in Place as Prescribed: Yes Pain Present Now: No Electronic Signature(s) Signed: 12/04/2016 5:04:51 PM By: Alejandro Mulling Entered By: Alejandro Mulling on 12/04/2016 08:04:36 [name] ([id number]) -------------------------------------------------------------------------------- Encounter Discharge Information Details Patient Name: [name] Date of Service: 12/04/2016 8:00 AM Medical Record Number: [id number] Patient Account Number: [id number] Date of Birth/Sex: 1946/02/15 (71 y.o. Male) Treating RN: [name] Primary Care Leilah Polimeni: [name] Other Clinician: Referring Netta Fodge: [name] Treating Khya Halls/Extender: Altamese [place] in Treatment: 48 Encounter Discharge Information Items Discharge Pain Level: 0 Discharge  Condition: Stable Ambulatory Status: Ambulatory Discharge Destination: Home Transportation: Private Auto Accompanied By: self Schedule Follow-up Appointment: Yes Medication Reconciliation completed No and provided to Patient/Care Raymone Pembroke: Patient Clinical Summary of Care: Declined Electronic Signature(s) Signed: 12/04/2016 8:40:44 AM By: Gwenlyn Perking Entered By: Gwenlyn Perking on 12/04/2016 08:40:44 [name] ([id number]) -------------------------------------------------------------------------------- Lower Extremity Assessment Details Patient Name: [name] Date of Service: 12/04/2016 8:00 AM Medical Record Number: [id number] Patient Account Number: [id number] Date of Birth/Sex: 1946-08-26 (71 y.o. Male) Treating RN: Ashok Cordia, [name] Primary Care Alvena Kiernan: [name] Other Clinician: Referring Providence Stivers: [name] Treating Cashlynn Yearwood/Extender: [name] Weeks in Treatment: 46 Edema Assessment Assessed: [Left: No] [Right: No] E[Left: dema] [Right: :] Calf Left: Right: Point of Measurement: 40 cm From Medial Instep cm 34.3 cm Ankle Left: Right: Point of Measurement: 12 cm From Medial Instep cm 22 cm Vascular Assessment Pulses: Dorsalis Pedis Palpable: [Right:Yes] Posterior Tibial Extremity colors, hair growth, and conditions: Extremity Color: [Right:Normal] Temperature of Extremity: [Right:Warm] Capillary Refill: [Right:< 3 seconds] Toe Nail Assessment Left: Right: Thick: Yes Discolored: Yes Deformed: No Improper Length and Hygiene: Yes Electronic Signature(s) Signed: 12/04/2016 5:04:51 PM By: Alejandro Mulling Entered By: Alejandro Mulling on 12/04/2016 08:18:11 [name] ([id number]) -------------------------------------------------------------------------------- Multi Wound Chart Details Patient Name: [name] Date of Service: 12/04/2016 8:00 AM Medical Record Number: [id number] Patient Account Number:  [id number] Date of Birth/Sex: 1946-06-12 (71 y.o. Male) Treating RN: Ashok Cordia, [name] Primary Care Zeek Rostron: [name] Other Clinician: Referring Shaton Lore: [name] Treating Genia Perin/Extender: [name] Weeks in Treatment: 46 Vital Signs Height(in): 76 Pulse(bpm): 67 Weight(lbs): 238 Blood Pressure 156/63 (mmHg): Body Mass Index(BMI): 29 Temperature(F): 97.8 Respiratory Rate 18 (breaths/min): Photos: [1:No Photos] [2:No Photos] [N/A:N/A] Wound Location: [1:Right Malleolus - Lateral] [2:Right Malleolus - Medial] [N/A:N/A] Wounding Event: [1:Gradually Appeared] [2:Gradually Appeared] [N/A:N/A] Primary Etiology: [1:Venous Leg Ulcer] [2:Venous Leg Ulcer] [N/A:N/A] Comorbid History: [1:Cataracts, Chronic Obstructive Pulmonary Disease (COPD), Osteoarthritis] [2:Cataracts, Chronic Obstructive Pulmonary Disease (COPD), Osteoarthritis] [N/A:N/A] Date Acquired: [1:08/11/2015] [2:01/30/2016] [N/A:N/A] Weeks of Treatment: [1:46] [  2:44] [N/A:N/A] Wound Status: [1:Open] [2:Open] [N/A:N/A] Clustered Wound: [1:No] [2:Yes] [N/A:N/A] Measurements L x W x D 5.5x5x0.2 [2:2.5x1.5x0.2] [N/A:N/A] (cm) Area (cm) : [1:21.598] [2:2.945] [N/A:N/A] Volume (cm) : [1:4.32] [2:0.589] [N/A:N/A] % Reduction in Area: [1:-25.00%] [2:74.50%] [N/A:N/A] % Reduction in Volume: 16.70% [2:49.00%] [N/A:N/A] Classification: [1:Full Thickness Without Exposed Support Structures] [2:Full Thickness Without Exposed Support Structures] [N/A:N/A] Exudate Amount: [1:Large] [2:Large] [N/A:N/A] Exudate Type: [1:Serosanguineous] [2:Serosanguineous] [N/A:N/A] Exudate Color: [1:red, brown] [2:red, brown] [N/A:N/A] Wound Margin: [1:Distinct, outline attached] [2:Distinct, outline attached] [N/A:N/A] Granulation Amount: [1:Medium (34-66%)] [2:Medium (34-66%)] [N/A:N/A] Granulation Quality: [1:Red] [2:Red] [N/A:N/A] Necrotic Amount: [1:Medium (34-66%)] [2:Medium (34-66%)] [N/A:N/A] Exposed Structures: [1:Fascia: No  Fat Layer (Subcutaneous] [2:Fascia: No Fat Layer (Subcutaneous] [N/A:N/A] Tissue) Exposed: No Tissue) Exposed: No Tendon: No Tendon: No Muscle: No Muscle: No Joint: No Joint: No Bone: No Bone: No Limited to Skin Limited to Skin Breakdown Breakdown Epithelialization: Small (1-33%) Small (1-33%) N/A Debridement: Debridement (40981- N/A N/A 19147) Pre-procedure 08:22 N/A N/A Verification/Time Out Taken: Pain Control: Lidocaine 4% Topical N/A N/A Solution Tissue Debrided: Fibrin/Slough, Exudates, N/A N/A Subcutaneous Level: Skin/Subcutaneous N/A N/A Tissue Debridement Area (sq 27.5 N/A N/A cm): Instrument: Curette N/A N/A Bleeding: Minimum N/A N/A Hemostasis Achieved: Pressure N/A N/A Procedural Pain: 0 N/A N/A Post Procedural Pain: 0 N/A N/A Debridement Treatment Procedure was tolerated N/A N/A Response: well Post Debridement 5.5x5x0.2 N/A N/A Measurements L x W x D (cm) Post Debridement 4.32 N/A N/A Volume: (cm) Periwound Skin Texture: No Abnormalities Noted No Abnormalities Noted N/A Periwound Skin Maceration: Yes Maceration: Yes N/A Moisture: Periwound Skin Color: No Abnormalities Noted No Abnormalities Noted N/A Temperature: No Abnormality No Abnormality N/A Tenderness on No Yes N/A Palpation: Wound Preparation: Ulcer Cleansing: Ulcer Cleansing: N/A Rinsed/Irrigated with Rinsed/Irrigated with Saline, Other: soap and Saline, Other: soap and water water Topical Anesthetic Topical Anesthetic Applied: None, Other: Applied: None, Other: lidocaine 4% lidocaine 4% Procedures Performed: Debridement N/A N/A ELIODORO, GULLETT ([id number]) Treatment Notes Electronic Signature(s) Signed: 12/04/2016 5:28:32 PM By: [name] MD Entered By: [name] on 12/04/2016 86:57:84 [name] ([id number]) -------------------------------------------------------------------------------- Multi-Disciplinary Care Plan Details Patient Name: [name] Date of Service: 12/04/2016 8:00 AM Medical Record Number: [id number] Patient Account Number: [id number] Date of Birth/Sex: 03-Apr-1946 (71 y.o. Male) Treating RN: [name] Primary Care Achilles Neville: [name] Other Clinician: Referring Jhan Conery: [name] Treating Carlota Philley/Extender: [name] Weeks in Treatment: 46 Active Inactive ` Venous Leg Ulcer Nursing Diagnoses: Knowledge deficit related to disease process and management Goals: Patient will maintain optimal edema control Date Initiated: 04/03/2016 Target Resolution Date: 11/24/2016 Goal Status: Active Interventions: Compression as ordered Treatment Activities: Therapeutic compression applied : 04/03/2016 Notes: ` Wound/Skin Impairment Nursing Diagnoses: Impaired tissue integrity Goals: Ulcer/skin breakdown will heal within 14 weeks Date Initiated: 01/17/2016 Target Resolution Date: 11/24/2016 Goal Status: Active Interventions: Assess ulceration(s) every visit Notes: Electronic Signature(s) Signed: 12/04/2016 5:04:51 PM By: Aurelio Jew, [name] ([id number]) Entered By: Alejandro Mulling on 12/04/2016 08:20:00 [name] ([id number]) -------------------------------------------------------------------------------- Pain Assessment Details Patient Name: [name] Date of Service: 12/04/2016 8:00 AM Medical Record Number: [id number] Patient Account Number: [id number] Date of Birth/Sex: 1946-06-30 (71 y.o. Male) Treating RN: [name] Primary Care Mickaela Starlin: [name] Other Clinician: Referring Emmaclaire Switala: [name] Treating Nivan Melendrez/Extender: [name] Weeks in Treatment: 46 Active Problems Location of Pain Severity and Description of Pain Patient Has Paino No Site Locations With Dressing Change: No Pain Management and Medication Current Pain Management: Electronic Signature(s) Signed: 12/04/2016 5:04:51 PM  By: Alejandro Mulling Entered By:  Alejandro Mulling on 12/04/2016 08:06:04 [name] ([id number]) -------------------------------------------------------------------------------- Patient/Caregiver Education Details [name] Date of Service: 12/04/2016 8:00 AM Patient Name: C. Patient Account Number: [id number] Medical Record Treating RN: [name] [id number] Number: Other Clinician: Date of Birth/Gender: [birth year] (71 y.o. Male) Treating [name], [name] Primary Care Physician: [name] Physician/Extender: G Referring Physician: [name] in Treatment: 73 Education Assessment Education Provided To: Patient Education Topics Provided Wound/Skin Impairment: Handouts: Other: change dressing as ordered Methods: Demonstration, Explain/Verbal Responses: State content correctly Electronic Signature(s) Signed: 12/04/2016 5:04:51 PM By: Alejandro Mulling Entered By: Alejandro Mulling on 12/04/2016 08:19:48 [name] ([id number]) -------------------------------------------------------------------------------- Wound Assessment Details Patient Name: [name] Date of Service: 12/04/2016 8:00 AM Medical Record Number: [id number] Patient Account Number: [id number] Date of Birth/Sex: 1946-04-25 (71 y.o. Male) Treating RN: [name] Primary Care Bowman Higbie: [name] Other Clinician: Referring Aizen Duval: [name] Treating Polk Minor/Extender: [name] Weeks in Treatment: 46 Wound Status Wound Number: 1 Primary Venous Leg Ulcer Etiology: Wound Location: Right Malleolus - Lateral Wound Open Wounding Event: Gradually Appeared Status: Date Acquired: 08/11/2015 Comorbid Cataracts, Chronic Obstructive Weeks Of Treatment: 46 History: Pulmonary Disease (COPD), Clustered Wound: No Osteoarthritis Photos Photo Uploaded By: Alejandro Mulling on 12/04/2016 10:31:09 Wound Measurements Length: (cm) 5.5 Width: (cm) 5 Depth: (cm) 0.2 Area: (cm)  21.598 Volume: (cm) 4.32 % Reduction in Area: -25% % Reduction in Volume: 16.7% Epithelialization: Small (1-33%) Tunneling: No Undermining: No Wound Description Full Thickness Without Exposed Foul Odor Afte Classification: Support Structures Slough/Fibrino Wound Margin: Distinct, outline attached Exudate Large Amount: Exudate Type: Serosanguineous Exudate Color: red, brown r Cleansing: No Yes Wound Bed Granulation Amount: Medium (34-66%) Exposed Structure Granulation Quality: Red Fascia Exposed: No Dorvil, Conrad C. ([id number]) Necrotic Amount: Medium (34-66%) Fat Layer (Subcutaneous Tissue) Exposed: No Necrotic Quality: Adherent Slough Tendon Exposed: No Muscle Exposed: No Joint Exposed: No Bone Exposed: No Limited to Skin Breakdown Periwound Skin Texture Texture Color No Abnormalities Noted: No No Abnormalities Noted: No Moisture Temperature / Pain No Abnormalities Noted: No Temperature: No Abnormality Maceration: Yes Wound Preparation Ulcer Cleansing: Rinsed/Irrigated with Saline, Other: soap and water, Topical Anesthetic Applied: None, Other: lidocaine 4%, Treatment Notes Wound #1 (Right, Lateral Malleolus) 1. Cleansed with: Clean wound with Normal Saline Cleanse wound with antibacterial soap and water 2. Anesthetic Topical Lidocaine 4% cream to wound bed prior to debridement 4. Dressing Applied: Aquacel Ag 5. Secondary Dressing Applied ABD Pad Dry Gauze 7. Secured with Tape 3 Layer Compression System - Right Lower Extremity Notes drawtex Electronic Signature(s) Signed: 12/04/2016 5:04:51 PM By: Alejandro Mulling Entered By: Alejandro Mulling on 12/04/2016 08:15:14 [name] ([id number]) -------------------------------------------------------------------------------- Wound Assessment Details Patient Name: [name] Date of Service: 12/04/2016 8:00 AM Medical Record Number: [id number] Patient Account Number:  [id number] Date of Birth/Sex: 1946/04/14 (71 y.o. Male) Treating RN: Ashok Cordia, [name] Primary Care Cleave Ternes: [name] Other Clinician: Referring Amarii Amy: [name] Treating Symphonie Schneiderman/Extender: [name] Weeks in Treatment: 46 Wound Status Wound Number: 2 Primary Venous Leg Ulcer Etiology: Wound Location: Right Malleolus - Medial Wound Open Wounding Event: Gradually Appeared Status: Date Acquired: 01/30/2016 Comorbid Cataracts, Chronic Obstructive Weeks Of Treatment: 44 History: Pulmonary Disease (COPD), Clustered Wound: Yes Osteoarthritis Photos Photo Uploaded By: Alejandro Mulling on 12/04/2016 10:31:28 Wound Measurements Length: (cm) 2.5 Width: (cm) 1.5 Depth: (cm) 0.2 Area: (cm) 2.945 Volume: (cm) 0.589 % Reduction in Area: 74.5% % Reduction in Volume: 49% Epithelialization: Small (1-33%) Tunneling: No Undermining: No Wound Description  Full Thickness Without Exposed Foul Odor Afte Classification: Support Structures Slough/Fibrino Wound Margin: Distinct, outline attached Exudate Large Amount: Exudate Type: Serosanguineous Exudate Color: red, brown r Cleansing: No Yes Wound Bed Granulation Amount: Medium (34-66%) Exposed Structure Granulation Quality: Red Fascia Exposed: No Dadisman, Careem C. ([id number]) Necrotic Amount: Medium (34-66%) Fat Layer (Subcutaneous Tissue) Exposed: No Necrotic Quality: Adherent Slough Tendon Exposed: No Muscle Exposed: No Joint Exposed: No Bone Exposed: No Limited to Skin Breakdown Periwound Skin Texture Texture Color No Abnormalities Noted: No No Abnormalities Noted: No Moisture Temperature / Pain No Abnormalities Noted: No Temperature: No Abnormality Maceration: Yes Tenderness on Palpation: Yes Wound Preparation Ulcer Cleansing: Rinsed/Irrigated with Saline, Other: soap and water, Topical Anesthetic Applied: None, Other: lidocaine 4%, Treatment Notes Wound #2 (Right, Medial Malleolus) 1. Cleansed  with: Clean wound with Normal Saline Cleanse wound with antibacterial soap and water 2. Anesthetic Topical Lidocaine 4% cream to wound bed prior to debridement 4. Dressing Applied: Aquacel Ag 5. Secondary Dressing Applied ABD Pad Dry Gauze 7. Secured with Tape 3 Layer Compression System - Right Lower Extremity Notes drawtex Electronic Signature(s) Signed: 12/04/2016 5:04:51 PM By: Alejandro Mulling Entered By: Alejandro Mulling on 12/04/2016 08:17:00 [name] ([id number]) -------------------------------------------------------------------------------- Vitals Details Patient Name: [name] Date of Service: 12/04/2016 8:00 AM Medical Record Number: [id number] Patient Account Number: [id number] Date of Birth/Sex: 1946-06-24 (71 y.o. Male) Treating RN: Ashok Cordia, [name] Primary Care Itzamar Traynor: [name] Other Clinician: Referring Treyven Lafauci: [name] Treating Forrest Jaroszewski/Extender: [name] Weeks in Treatment: 46 Vital Signs Time Taken: 08:05 Temperature (F): 97.8 Height (in): 76 Pulse (bpm): 67 Weight (lbs): 238 Respiratory Rate (breaths/min): 18 Body Mass Index (BMI): 29 Blood Pressure (mmHg): 156/63 Reference Range: 80 - 120 mg / dl Electronic Signature(s) Signed: 12/04/2016 5:04:51 PM By: Alejandro Mulling Entered By: Alejandro Mulling on 12/04/2016 08:65:78

## 2016-12-06 NOTE — Progress Notes (Signed)
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 in 1988 had an extensive wound on the dorsal aspect of his right foot that required skin grafting at the time to close. 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 Wound is quite extensive measuring 5. 4 x 4 by 0.3. They have been using some thermal wound product and sprayed that the obtained on the Internet for the last 5-6 monthsing much progress. This started as a small open wound that expanded. 01/24/16; the patient is been receiving Santyl changed daily by his wife. Continue debridement. Patient has no complaints 01/31/16; the patient arrives with irritation on the medial aspect of his ankle noticed by her intake nurse. The patient is noted pain in the area over the last day or 2. There are four new tiny wounds in this area. His co- pay for TheraSkin application is really high I think beyond her means 02/07/16; patient is  improved C+S cultures MSSA completed Doxy. using iodoflex 02/15/16; patient arrived today with the wound and roughly the same condition. Extensive area on the right lateral foot and ankle. Using Iodoflex. He came in last week with a cluster of new wounds on the medial aspect of the same ankle. 02/22/16; once again the patient complains of a lot of drainage coming out of this wound. We brought him back in on Friday for a dressing change has been using Iodoflex. States his pain level is better 02/29/16; still complaining of a lot of drainage even though we are putting absorbent material over the Santyl and bringing him back on Fridays for dressing changes. He is not complaining of pain. Her intake nurse notes blistering 03/07/16: pt returns today for f/u. he admits out in rain on Saturday and soaked his right leg. he did not share with his wife and he didn't notify the [facility]. he has an odor today that is c/w pseudomonas. Wound has greenish tan slough. there is no periwound erythema, induration, or fluctuance. wound has deteriorated since previous visit. denies fever, chills, body aches or malaise. no increased pain. 03/13/16: C+S showed proteus. He has not received AB'S. Switched to RTD last week. 03/27/16 patient is been using Iodoflex. Wound bed has improved and debridement is certainly easier 04/10/2016 -- he has been scheduled for a venous duplex study towards the end of the month 04/17/16; has been using silver alginate, states that the Iodoflex was hurting his wound and since that is been changed he has had no pain unfortunately the surface of the wound continues to be unhealthy with Shaker, Deforest C. ([id number]) thick gelatinous slough and nonviable tissue. The wound will  not heal like this. 04/20/2016 -- the patient was here for a nurse visit but I was asked to see the patient as the slough was quite significant and the nurse needed for clarification regarding the ointment to be used. 04/24/16;  the patient's wounds on the right medial and right lateral ankle/malleolus both look a lot better today. Less adherent slough healthier tissue. Dimensions better especially medially 05/01/16; the patient's wound surface continues to improve however he continues to require debridement switch her easier each week. Continue Santyl/Metahydrin mixture Hydrofera Blue next week. Still drainage on the medial aspect according to the intake nurse 05/08/16; still using Santyl and Medihoney. Still a lot of drainage per her intake nurse. Patient has no complaints pain fever chills etc. 05/15/16 switched the Hydrofera Blue last week. Dimensions down especially in the medial right leg wound. Area on the lateral which is more substantial also looks better still requires debridement 05/22/16; we have been using Hydrofera Blue. 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 Currently he tells me that the pain is doing very well about a 1 out of 10 today. Yesterday was a little bit worse but he tells me that he was more active watering his flowers that day. Overall he feels that his symptoms are doing significantly better at this point in time. His edema continues to be controlled well with the 4-layer compression wrap and he really has not noted any odor at this point in time. He is tolerating the dressing changes when they are performed well. 06/05/16 at this point in time today patient currently shows no interval signs or symptoms of local or systemic infection. Again his pain level he rates to be a 1 out of 10 at most and overall he tells me that generally this is not giving him much trouble. In fact he even feels maybe a little bit better than last week. We have continue with the 4-layer compression wrap in which she tolerates very well at this  point. He is continuing to utilize the [place]. 06/12/16 I think there has been some progression in the status of both of these wounds over today again covered in a gelatinous surface. Has been using Hydrofera Blue. We had used Iodoflex in the past I'm not sure if there was an issue other than changing to something that might progress towards closure faster 06/19/16; he did not tolerate the Flexeril last week secondary to pain and this was changed on Friday back to [facility] area he continues to have copious amounts of gelatinous surface slough which is think inhibiting the speed of healing this area 06/26/16 patient over the last week has utilized the Santyl to try to loosen up some of the tightly adherent slough that was noted on evaluation last week. The good news is he tells me that the medial malleoli region really does not bother him the right llateral malleoli region is more tender to palpation at this point in time especially in the central/inferior location. However it does appear that the Santyl has done his job to loosen up the adherent slough at this point in time. Fortunately he has no interval signs or symptoms of infection locally or systemically no purulent discharge noted. 07/03/16 at this point in time today patient's wounds appear to be significantly improved over the right medial and lateral malleolus locations.  He has much less tenderness at this point in time and the wounds appear clean her although there is still adherent slough this is sufficiently improved over what I saw last week. I still see no evidence of local infection. 07/10/16; continued gradual improvement in the right medial and lateral malleolus locations. The lateral is more substantial wound now divided into 2 by a rim of normal epithelialization. Both areas have adherent surface slough and nonviable subcutaneous tissue 07-17-16- He continues to have progress to his right medial and  lateral malleolus ulcers. He denies any complaints of pain or intolerance to compression. Both ulcers are smaller in size oriented today's measurements, both are covered with a softly adherent slough. 07/24/16; medial wound is smaller, lateral about the same although surface looks better. Still using JUERGEN, HARDENBROOK ([id number]) Hydrofera Blue 07/31/16; arrives today complaining of pain in the lateral part of his foot. Nurse reports a lot more drainage. He has been using Hydrofera Blue. Switch to silver alginate today 08/03/2016 -- I was asked to see the patient was here for a nurse visit today. I understand he had a lot of pain in his right lower extremity and was having blisters on his right foot which have not been there before. Though he started on doxycycline he does not have blisters elsewhere on his body. I do not believe this is a drug allergy. also mentioned that there was a copious purulent discharge from the wound and clinically there is no evidence of cellulitis. 08/07/16; I note that the patient came in for his nurse check on Friday apparently with blisters on his toes on the right than a lot of swelling in his forefoot. He continued on the doxycycline that I had prescribed on 12/8. A culture was done of the lateral wound that showed a combination of a few Proteus and Pseudomonas. Doxycycline might of covered the Proteus but would be unlikely to cover the Pseudomonas. He is on Coumadin. He arrives in the clinic today feeling a lot better states the pain is a lot better but nothing specific really was done other than to rewrap the foot also noted that he had arterial studies ordered in August although these were never done. It is reasonable to go ahead and reorder these. 08/14/16; generally arrives in a better state today in terms of the wounds he has taken cefdinir for one week. Our intake nurse reports copious amounts of drainage but the patient is complaining of much  less pain. He is not had his PT and INR checked and I've asked him to do this today or tomorrow. 08/24/2016 -- patient arrives today after 10 days and said he had a stomach upset. His arterial study was done and I have reviewed this report and find it to be within normal limits. However I did not note any venous duplex studies for reflux, and Dr. Leanord Hawking may have ordered these in the past but I will leave it to him to decide if he needs these. The patient has finished his course of cefdinir. 08/28/16; patient arrives today again with copious amounts of thick really green drainage for our intake nurse. He states he has a very tender spot at the superior part of the lateral wound. Wounds are larger 09/04/16; no real change in the condition of this patient's wound still copious amounts of surface slough. Started him on Iodoflex last week he is completing another course of Cefdinir or which I think was done empirically. His arterial study showed ABIs were  1.1 on the right 1.5 on the left. He did have a slightly reduced ABI in the right the left one was not obtained. Had calcification of the right posterior tibial artery. The interpretation was no segmental stenosis. His waveforms were triphasic. His reflux studies are later this month. Depending on this I'll send him for a vascular consultation, he may need to see plastic surgery as I believe he is had plastic surgery on this foot in the past. He had an injury to the foot in the 1980s. 1/16 /18 right lateral greater than right medial ankle wounds on the right in the setting of previous skin grafting. 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 He does not have arterial issues. Each week he comes in with the same adherent surface slough although there was less of this today 09/18/16; right lateral greater than right medial lower extremity wounds in the setting of previous skin  grafting and trauma. He has least to vein laser ablation scheduled for February 2 for venous reflux. He does not have significant arterial disease. Problem has been very difficult to handle surface slough/necrotic tissue. Recently using Iodoflex for this with some, albeit slow improvement 09/25/16; right lateral greater than right medial lower extremity venous wounds in the setting of previous skin grafting. He is going for ablation surgery on February 2 after this he'll come back here for rewrap. He has been using Iodoflex as the primary dressing. 10/02/16; right lateral greater than right medial lower extremity wounds in the setting of previous skin grafting. He had his ablation surgery last week, I don't have a report. He tolerated this well. Came in with a thigh- high Unna boots on Friday. We have been using Iodoflex as the primary dressing. His measurements are improving 10/09/16; continues to make nice aggressive in terms of the wounds on his lateral and medial right ankle in the setting of previous skin grafting. Yesterday he noticed drainage at one of his surgical sites from his KYZER, BLOWE. ([id number]) venous ablation on the right calf. He took off the bandage over this area felt a "popping" sensation and a reddish-brown drainage. He is not complaining of any pain 10/16/16; he continues to make nice progression in terms of the wounds on the lateral and medial malleolus. Both smaller using Iodoflex. He had a surgical area in his posterior mid calf we have been using iodoform. All the wounds are down and dimensions 10/23/16; the patient arrives today with no complaints. He states the Iodoflex is a bit uncomfortable. He is not systemically unwell. We have been using Iodoflex to the lateral right ankle and the medial and Aquacel Ag to the reflux surgical wound on the posterior right calf. All of these wounds are doing well 10/30/16; patient states he has no pain no systemic symptoms. I  changed him to [facility] last week. Although the wounds are doing well 11/06/16; patient reports no pain or systemic symptoms. We continue with Hydrofera Blue. Both wound areas on the medial and lateral ankle appear to be doing well with improvement and dimensions and improvement in the wound bed. 11/13/16; patient's dimensions continued to improve. We continue with Hydrofera Blue on the medial and lateral side. Appear to be doing well with healthy granulation and advancing epithelialization 11/20/16; patient's dimensions improving laterally by about half a centimeter in length. Otherwise no change on the medial side. Using [facility]  12/04/16; no major change in patient's wound dimensions. Intake nurse reports more drainage. 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 He is voicing no complaints or concerns. He has been tolerating his twice weekly compression therapy changes Objective Constitutional Patient is hypertensive.. Pulse regular and within target range for patient.Marland Kitchen Respirations regular, non-labored and within target range.. Temperature is normal and within the target range for the patient.. Patient's appearance is neat and clean. Appears in no acute distress. Well nourished and well developed.. Vitals Time Taken: 8:05 AM, Height: 76 in, Weight: 238 lbs, BMI: 29, Temperature: 97.8 F, Pulse: 67 bpm, Respiratory Rate: 18 breaths/min, Blood Pressure: 156/63 mmHg. Cardiovascular Pedal pulses palpable and strong bilaterally.. Edema present in both extremities. Edema is controlled with current compression. General Notes: Wound exam; bimalleolar wounds on the right. The medial wound is smaller with a healthy surface. Lateral wound looks about the same as last time. There are islands of epithelialization. A lot of adherent material which required debridement of #5 curet.  Hemostasis with direct pressure. Post debridement the granulation here looks satisfactory as well. Circumference of both wounds looked wet. There was no evidence of infection however Mesch, Taggart C. ([id number]) Integumentary (Hair, Skin) Wound #1 status is Open. Original cause of wound was Gradually Appeared. The wound is located on the Right,Lateral Malleolus. The wound measures 5.5cm length x 5cm width x 0.2cm depth; 21.598cm^2 area and 4.32cm^3 volume. The wound is limited to skin breakdown. There is no tunneling or undermining noted. There is a large amount of serosanguineous drainage noted. The wound margin is distinct with the outline attached to the wound base. There is medium (34-66%) red granulation within the wound bed. There is a medium (34-66%) amount of necrotic tissue within the wound bed including Adherent Slough. The periwound skin appearance exhibited: Maceration. Periwound temperature was noted as No Abnormality. Wound #2 status is Open. Original cause of wound was Gradually Appeared. The wound is located on the Right,Medial Malleolus. The wound measures 2.5cm length x 1.5cm width x 0.2cm depth; 2.945cm^2 area and 0.589cm^3 volume. The wound is limited to skin breakdown. There is no tunneling or undermining noted. There is a large amount of serosanguineous drainage noted. The wound margin is distinct with the outline attached to the wound base. There is medium (34-66%) red granulation within the wound bed. There is a medium (34-66%) amount of necrotic tissue within the wound bed including Adherent Slough. The periwound skin appearance exhibited: Maceration. Periwound temperature was noted as No Abnormality. The periwound has tenderness on palpation. 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 There was a Skin/Subcutaneous Tissue Debridement ([id number]) debridement with total area of 27.5 sq cm performed by [name], MD. with the following instrument(s): Curette to remove Viable and Non-Viable tissue/material including Exudate, Fibrin/Slough, and Subcutaneous after achieving pain control using Lidocaine 4% Topical Solution. A time out was conducted at  08:22, prior to the start of the procedure. A Minimum amount of bleeding was controlled with Pressure. The procedure was tolerated well with a pain level of 0 throughout and a pain level of 0 following the procedure. Post Debridement Measurements: 5.5cm length x 5cm width x 0.2cm depth; 4.32cm^3 volume. EMILIAN, STAWICKI ([id number]) Character of Wound/Ulcer Post Debridement requires further debridement. Severity of Tissue Post Debridement is: Fat layer exposed. Post procedure Diagnosis Wound #1: Same as Pre-Procedure Plan Wound Cleansing: Wound #1 Right,Lateral Malleolus: Cleanse wound with mild soap and water May shower with protection. No tub bath. Wound #2 Right,Medial Malleolus: Cleanse wound with mild soap and water May shower with protection. No tub bath. Anesthetic: Wound #1 Right,Lateral Malleolus: Topical Lidocaine 4% cream applied to wound bed prior to debridement Wound #2 Right,Medial Malleolus: Topical Lidocaine 4% cream applied to wound bed prior to debridement Skin Barriers/Peri-Wound Care: Wound #1 Right,Lateral Malleolus: Barrier cream - zinc  oxide Triamcinolone Acetonide Ointment Primary Wound Dressing: Wound #1 Right,Lateral Malleolus: Aquacel Ag Wound #2 Right,Medial Malleolus: Aquacel Ag Secondary Dressing: Wound #1 Right,Lateral Malleolus: ABD pad Drawtex Wound #2 Right,Medial Malleolus: ABD pad Drawtex Dressing Change Frequency: Wound #1 Right,Lateral Malleolus: Change dressing every week Other: - nurse visit Friday Wound #2 Right,Medial Malleolus: Change dressing every week Other: - nurse visit Friday Follow-up Appointments: Wound #1 Right,Lateral Malleolus: LYAL, HUSTED. ([id number]) Return Appointment in 1 week. Other: - nurse visit Friday Wound #2 Right,Medial Malleolus: Return Appointment in 1 week. Other: - nurse visit Friday Edema Control: Wound #1 Right,Lateral Malleolus: 3 Layer Compression System - Right Lower Extremity - UNNA TO ANCHOR Elevate legs to the level of the heart and pump ankles as often as possible Wound #2 Right,Medial Malleolus: 3 Layer Compression System - Right Lower Extremity - UNNA TO ANCHOR Elevate legs to the level of the heart and pump ankles as often as possible Additional Orders / Instructions: Wound #1 Right,Lateral Malleolus: Increase protein intake. OK to return to work with the following restrictions: Activity as tolerated Wound #2 Right,Medial Malleolus: Increase protein intake. OK to return to work with the following restrictions: Activity as tolerated #1 I have switched the primary dressing to Aquacel Ag from [facility] which we have used for a number of weeks. The primary reason for this was drainage and the fact that dimensions had stalled #2 major wound on the lateral right ankle has epithelialization centrally although this is apparently not changed that much in 2 weeks. #3 I see no evidence of infection here. His edema is controlled Electronic Signature(s) Signed: 12/04/2016 8:42:11 AM By: [name] MD Entered By: [name] on  12/04/2016 08:42:11 [name] ([id number]) -------------------------------------------------------------------------------- SuperBill Details [name] Date of Service: 12/04/2016 Patient Name: C. Patient Account Number: [id number] Medical Record Treating RN: [name] [id number] Number: Other Clinician: Date of Birth/Sex: 09-09-1945 (71 y.o. 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, initial T85.613A encounter Disruption of external operation (surgical) wound, not elsewhere classified, subsequent T81.31XD encounter L97.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 of skin Facility Procedures CPT4 Code: [id number] Description: 11042 - DEB SUBQ TISSUE 20 SQ CM/<  ICD-10 Description Diagnosis L97.212 Non-pressure chronic ulcer of right calf with fat la Modifier: yer exposed Quantity: 1 CPT4 Code: [id number] Description: 11045 - DEB SUBQ TISS EA ADDL 20CM ICD-10 Description Diagnosis L97.212 Non-pressure chronic ulcer of right calf with fat la Modifier: yer exposed Quantity: 1 Physician Procedures CPT4 Code: [id number] Description: 11042 - WC PHYS SUBQ TISS 20 SQ CM ICD-10 Description Diagnosis L97.212 Non-pressure chronic ulcer of right calf with fat la Modifier: yer exposed Quantity: 1 CPT4 Code: [id number] Carreker, L Description: 11045 - WC PHYS SUBQ TISS EA ADDL 20 CM Description Tillman Abide ([id number]) Modifier: Quantity: 1 Electronic Signature(s) Signed: 12/04/2016 5:28:32  PM By: [name] MD Entered By: [name] on 12/04/2016 08:42:50

## 2016-12-07 DIAGNOSIS — I87331 Chronic venous hypertension (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: Secondary | ICD-10-CM | POA: Diagnosis not present

## 2016-12-09 NOTE — Progress Notes (Signed)
[name], [name] ([id number]) Visit Report for 12/07/2016 Arrival Information Details Patient Name: [name], [name]. Date of Service: 12/07/2016 8:00 AM Medical Record Number: [id number] Patient Account Number: [id number] Date of Birth/Sex: 1946/07/15 (71 y.o. Male) Treating RN: [name] Primary Care Steffie Waggoner: [name] Other Clinician: Referring Zniya Cottone: [name] Treating Kandon Hosking/Extender: Rudene Re in Treatment: 45 Visit Information History Since Last Visit All ordered tests and consults were completed: No Patient Arrived: Ambulatory Added or deleted any medications: No Arrival Time: 08:06 Any new allergies or adverse reactions: No Accompanied By: self Had a fall or experienced change in No Transfer Assistance: None activities of daily living that may affect Patient Identification Verified: Yes risk of falls: Secondary Verification Process Yes Signs or symptoms of abuse/neglect since last No Completed: visito Patient Requires Transmission-Based No Hospitalized since last visit: No Precautions: Has Dressing in Place as Prescribed: Yes Patient Has Alerts: No Has Compression in Place as Prescribed: Yes Pain Present Now: No Electronic Signature(s) Signed: 12/07/2016 4:40:58 PM By: Alejandro Mulling Entered By: Alejandro Mulling on 12/07/2016 08:06:32 [name] ([id number]) -------------------------------------------------------------------------------- Compression Therapy Details Patient Name: [name] Date of Service: 12/07/2016 8:00 AM Medical Record Number: [id number] Patient Account Number: [id number] Date of Birth/Sex: [birth year] (71 y.o. Male) Treating RN: [name] Primary Care Tineka Uriegas: [name] Other Clinician: Referring Olawale Marney: [name] Treating Liba Hulsey/Extender: Rudene Re in Treatment: 46 Compression Therapy Performed for Wound Wound #1 Right,Lateral Malleolus Assessment: Performed  By: Jannifer Hick, [name], RN Compression Type: Three Layer Pre Treatment ABI: 1.2 Electronic Signature(s) Signed: 12/07/2016 4:40:58 PM By: Alejandro Mulling Entered By: Alejandro Mulling on 12/07/2016 08:37:59 [name] ([id number]) -------------------------------------------------------------------------------- Compression Therapy Details Patient Name: [name] Date of Service: 12/07/2016 8:00 AM Medical Record Number: [id number] Patient Account Number: [id number] Date of Birth/Sex: [birth year] (71 y.o. Male) Treating RN: [name] Primary Care Vinayak Bobier: [name] Other Clinician: Referring Bethani Brugger: [name] Treating Leanthony Rhett/Extender: Rudene Re in Treatment: 46 Compression Therapy Performed for Wound Wound #2 Right,Medial Malleolus Assessment: Performed By: Jannifer Hick, [name], RN Compression Type: Three Layer Pre Treatment ABI: 1.2 Electronic Signature(s) Signed: 12/07/2016 4:40:58 PM By: Alejandro Mulling Entered By: Alejandro Mulling on 12/07/2016 08:38:00 [name] ([id number]) -------------------------------------------------------------------------------- Encounter Discharge Information Details Patient Name: [name] Date of Service: 12/07/2016 8:00 AM Medical Record Number: [id number] Patient Account Number: [id number] Date of Birth/Sex: 1945-09-30 (71 y.o. Male) Treating RN: [name] Primary Care Yaniel Limbaugh: [name] Other Clinician: Referring Danuta Huseman: [name] Treating Tarence Searcy/Extender: Rudene Re in Treatment: 74 Encounter Discharge Information Items Discharge Pain Level: 0 Discharge Condition: Stable Ambulatory Status: Ambulatory Discharge Destination: Home Private Transportation: Auto Accompanied By: self Schedule Follow-up Appointment: Yes Medication Reconciliation completed and No provided to Patient/Care Estel Scholze: Clinical Summary of Care: Electronic  Signature(s) Signed: 12/07/2016 4:40:58 PM By: Alejandro Mulling Entered By: Alejandro Mulling on 12/07/2016 08:08:29 [name] ([id number]) -------------------------------------------------------------------------------- Patient/Caregiver Education Details Patient Name: [name] Date of Service: 12/07/2016 8:00 AM Medical Record Number: [id number] Patient Account Number: [id number] Date of Birth/Gender: September 08, 1945 (71 y.o. Male) Treating RN: [name] Primary Care Physician: [name] Other Clinician: Referring Physician: [name] Treating Physician/Extender: Rudene Re in Treatment: 13 Education Assessment Education Provided To: Patient Education Topics Provided Wound/Skin Impairment: Handouts: Other: do nolt get wrap wet Methods: Demonstration, Explain/Verbal Responses: State content correctly Electronic Signature(s) Signed: 12/07/2016 4:40:58 PM By: Alejandro Mulling Entered By: Alejandro Mulling on 12/07/2016 08:08:16 [name] ([id number]) -------------------------------------------------------------------------------- Wound Assessment Details Patient  Name: [name], [name] Date of Service: 12/07/2016 8:00 AM Medical Record Number: [id number] Patient Account Number: [id number] Date of Birth/Sex: [birth year] (71 y.o. Male) Treating RN: Ashok Cordia, [name] Primary Care Kamie Korber: [name] Other Clinician: Referring Isma Tietje: [name] Treating Loleta Frommelt/Extender: Rudene Re in Treatment: 46 Wound Status Wound Number: 1 Primary Venous Leg Ulcer Etiology: Wound Location: Right Malleolus - Lateral Wound Open Wounding Event: Gradually Appeared Status: Date Acquired: 08/11/2015 Comorbid Cataracts, Chronic Obstructive Weeks Of Treatment: 46 History: Pulmonary Disease (COPD), Clustered Wound: No Osteoarthritis Wound Measurements Length: (cm) 5.5 Width: (cm) 5 Depth: (cm) 0.2 Area: (cm) 21.598 Volume:  (cm) 4.32 % Reduction in Area: -25% % Reduction in Volume: 16.7% Epithelialization: Small (1-33%) Tunneling: No Undermining: No Wound Description Full Thickness Without Exposed Classification: Support Structures Wound Margin: Distinct, outline attached Exudate Large Amount: Exudate Type: Serosanguineous Exudate Color: red, brown Foul Odor After Cleansing: No Slough/Fibrino Yes Wound Bed Granulation Amount: Medium (34-66%) Exposed Structure Granulation Quality: Red Fascia Exposed: No Necrotic Amount: Medium (34-66%) Fat Layer (Subcutaneous Tissue) Exposed: No Necrotic Quality: Adherent Slough Tendon Exposed: No Muscle Exposed: No Joint Exposed: No Bone Exposed: No Limited to Skin Breakdown Periwound Skin Texture Texture Color No Abnormalities Noted: No No Abnormalities Noted: No Moisture Temperature / Pain Cureton, Praise C. ([id number]) No Abnormalities Noted: No Temperature: No Abnormality Maceration: Yes Wound Preparation Ulcer Cleansing: Rinsed/Irrigated with Saline, Other: soap and water, Topical Anesthetic Applied: None, Other: lidocaine 4%, Treatment Notes Wound #1 (Right, Lateral Malleolus) 1. Cleansed with: Clean wound with Normal Saline Cleanse wound with antibacterial soap and water 4. Dressing Applied: Aquacel Ag 5. Secondary Dressing Applied ABD Pad 7. Secured with Tape 3 Layer Compression System - Right Lower Extremity Notes drawtex, unna to anchor Electronic Signature(s) Signed: 12/07/2016 4:40:58 PM By: Alejandro Mulling Entered By: Alejandro Mulling on 12/07/2016 08:07:04 [name] ([id number]) -------------------------------------------------------------------------------- Wound Assessment Details Patient Name: [name] Date of Service: 12/07/2016 8:00 AM Medical Record Number: [id number] Patient Account Number: [id number] Date of Birth/Sex: [birth year] (71 y.o. Male) Treating RN: Ashok Cordia, [name] Primary Care  Reyann Troop: [name] Other Clinician: Referring Umar Patmon: [name] Treating Muaad Boehning/Extender: Rudene Re in Treatment: 46 Wound Status Wound Number: 2 Primary Venous Leg Ulcer Etiology: Wound Location: Right Malleolus - Medial Wound Open Wounding Event: Gradually Appeared Status: Date Acquired: 01/30/2016 Comorbid Cataracts, Chronic Obstructive Weeks Of Treatment: 44 History: Pulmonary Disease (COPD), Clustered Wound: Yes Osteoarthritis Wound Measurements Length: (cm) 2.5 Width: (cm) 1.5 Depth: (cm) 0.2 Area: (cm) 2.945 Volume: (cm) 0.589 % Reduction in Area: 74.5% % Reduction in Volume: 49% Epithelialization: Small (1-33%) Tunneling: No Undermining: No Wound Description Full Thickness Without Exposed Classification: Support Structures Wound Margin: Distinct, outline attached Exudate Large Amount: Exudate Type: Serosanguineous Exudate Color: red, brown Foul Odor After Cleansing: No Slough/Fibrino Yes Wound Bed Granulation Amount: Medium (34-66%) Exposed Structure Granulation Quality: Red Fascia Exposed: No Necrotic Amount: Medium (34-66%) Fat Layer (Subcutaneous Tissue) Exposed: No Necrotic Quality: Adherent Slough Tendon Exposed: No Muscle Exposed: No Joint Exposed: No Bone Exposed: No Limited to Skin Breakdown Periwound Skin Texture Texture Color No Abnormalities Noted: No No Abnormalities Noted: No Moisture Temperature / Pain Wimberly, Merrick C. ([id number]) No Abnormalities Noted: No Temperature: No Abnormality Maceration: Yes Tenderness on Palpation: Yes Wound Preparation Ulcer Cleansing: Rinsed/Irrigated with Saline, Other: soap and water, Topical Anesthetic Applied: None, Other: lidocaine 4%, Treatment Notes Wound #2 (Right, Medial Malleolus) 1. Cleansed with: Clean wound with Normal Saline Cleanse wound with antibacterial soap and water 4. Dressing Applied:  Aquacel Ag 5. Secondary Dressing Applied ABD Pad 7.  Secured with Tape 3 Layer Compression System - Right Lower Extremity Notes drawtex, unna to anchor Electronic Signature(s) Signed: 12/07/2016 4:40:58 PM By: Alejandro Mulling Entered By: Alejandro Mulling on 12/07/2016 08:07:17

## 2016-12-11 ENCOUNTER — Encounter: Payer: Medicare HMO | Admitting: Internal Medicine

## 2016-12-11 DIAGNOSIS — I87331 Chronic venous hypertension (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: Secondary | ICD-10-CM | POA: Diagnosis not present

## 2016-12-13 NOTE — Progress Notes (Signed)
ALEXX, MCBURNEY ([id number]) Visit Report for 12/11/2016 Arrival Information Details Patient Name: [name], [name]. Date of Service: 12/11/2016 8:00 AM Medical Record Number: [id number] Patient Account Number: [id number] Date of Birth/Sex: 10/25/1945 (71 y.o. Male) Treating RN: [name] Primary Care Tehila Sokolow: [name] Other Clinician: Referring Charleston Vierling: [name] Treating Nieves Barberi/Extender: Altamese Waller in Treatment: 47 Visit Information History Since Last Visit All ordered tests and consults were completed: No Patient Arrived: Ambulatory Added or deleted any medications: No Arrival Time: 08:03 Any new allergies or adverse reactions: No Accompanied By: self Had a fall or experienced change in No Transfer Assistance: None activities of daily living that may affect Patient Identification Verified: Yes risk of falls: Secondary Verification Process Yes Signs or symptoms of abuse/neglect since last No Completed: visito Patient Requires Transmission-Based No Hospitalized since last visit: No Precautions: Has Dressing in Place as Prescribed: Yes Patient Has Alerts: No Has Compression in Place as Prescribed: Yes Pain Present Now: No Electronic Signature(s) Signed: 12/11/2016 4:52:03 PM By: Alejandro Mulling Entered By: Alejandro Mulling on 12/11/2016 08:03:36 [name] ([id number]) -------------------------------------------------------------------------------- Encounter Discharge Information Details Patient Name: [name] Date of Service: 12/11/2016 8:00 AM Medical Record Number: [id number] Patient Account Number: [id number] Date of Birth/Sex: 1946/06/23 (71 y.o. Male) Treating RN: [name] Primary Care Kaylon Laroche: [name] Other Clinician: Referring Janelly Switalski: [name] Treating Dallana Mavity/Extender: Altamese Keizer in Treatment: 59 Encounter Discharge Information Items Discharge Pain Level: 0 Discharge  Condition: Stable Ambulatory Status: Ambulatory Discharge Destination: Home Transportation: Private Auto Accompanied By: self Schedule Follow-up Appointment: Yes Medication Reconciliation completed No and provided to Patient/Care Nhi Butrum: Patient Clinical Summary of Care: Declined Electronic Signature(s) Signed: 12/11/2016 8:48:51 AM By: Gwenlyn Perking Entered By: Gwenlyn Perking on 12/11/2016 08:48:51 [name] ([id number]) -------------------------------------------------------------------------------- Lower Extremity Assessment Details Patient Name: [name] Date of Service: 12/11/2016 8:00 AM Medical Record Number: [id number] Patient Account Number: [id number] Date of Birth/Sex: May 21, 1946 (71 y.o. Male) Treating RN: Ashok Cordia, [name] Primary Care Renaud Celli: [name] Other Clinician: Referring Kinberly Perris: [name] Treating Clorinda Wyble/Extender: [name] Weeks in Treatment: 47 Edema Assessment Assessed: [Left: No] [Right: No] E[Left: dema] [Right: :] Calf Left: Right: Point of Measurement: 40 cm From Medial Instep cm 34.3 cm Ankle Left: Right: Point of Measurement: 12 cm From Medial Instep cm 22.1 cm Vascular Assessment Pulses: Dorsalis Pedis Palpable: [Right:Yes] Posterior Tibial Extremity colors, hair growth, and conditions: Extremity Color: [Right:Normal] Temperature of Extremity: [Right:Warm] Capillary Refill: [Right:< 3 seconds] Toe Nail Assessment Left: Right: Thick: Yes Discolored: Yes Deformed: No Improper Length and Hygiene: Yes Electronic Signature(s) Signed: 12/11/2016 4:52:03 PM By: Alejandro Mulling Entered By: Alejandro Mulling on 12/11/2016 08:15:35 [name] ([id number]) -------------------------------------------------------------------------------- Multi Wound Chart Details Patient Name: [name] Date of Service: 12/11/2016 8:00 AM Medical Record Number: [id number] Patient Account Number:  [id number] Date of Birth/Sex: [birth year] (71 y.o. Male) Treating RN: Ashok Cordia, [name] Primary Care Yanis Juma: [name] Other Clinician: Referring Adryanna Friedt: [name] Treating Melysa Schroyer/Extender: [name] Weeks in Treatment: 47 Vital Signs Height(in): 76 Pulse(bpm): 71 Weight(lbs): 238 Blood Pressure 169/77 (mmHg): Body Mass Index(BMI): 29 Temperature(F): 97.5 Respiratory Rate 18 (breaths/min): Photos: [1:No Photos] [2:No Photos] [N/A:N/A] Wound Location: [1:Right Malleolus - Lateral] [2:Right Malleolus - Medial] [N/A:N/A] Wounding Event: [1:Gradually Appeared] [2:Gradually Appeared] [N/A:N/A] Primary Etiology: [1:Venous Leg Ulcer] [2:Venous Leg Ulcer] [N/A:N/A] Comorbid History: [1:Cataracts, Chronic Obstructive Pulmonary Disease (COPD), Osteoarthritis] [2:Cataracts, Chronic Obstructive Pulmonary Disease (COPD), Osteoarthritis] [N/A:N/A] Date Acquired: [1:08/11/2015] [2:01/30/2016] [N/A:N/A] Weeks of Treatment: [1:47] [  2:45] [N/A:N/A] Wound Status: [1:Open] [2:Open] [N/A:N/A] Clustered Wound: [1:No] [2:Yes] [N/A:N/A] Measurements L x W x D 6.5x6x0.2 [2:2.5x1.7x0.2] [N/A:N/A] (cm) Area (cm) : [1:30.631] [2:3.338] [N/A:N/A] Volume (cm) : [1:6.126] [2:0.668] [N/A:N/A] % Reduction in Area: [1:-77.30%] [2:71.10%] [N/A:N/A] % Reduction in Volume: -18.20% [2:42.20%] [N/A:N/A] Classification: [1:Full Thickness Without Exposed Support Structures] [2:Full Thickness Without Exposed Support Structures] [N/A:N/A] Exudate Amount: [1:Large] [2:Large] [N/A:N/A] Exudate Type: [1:Serosanguineous] [2:Serosanguineous] [N/A:N/A] Exudate Color: [1:red, brown] [2:red, brown] [N/A:N/A] Wound Margin: [1:Distinct, outline attached] [2:Distinct, outline attached] [N/A:N/A] Granulation Amount: [1:Medium (34-66%)] [2:Medium (34-66%)] [N/A:N/A] Granulation Quality: [1:Red] [2:Red] [N/A:N/A] Necrotic Amount: [1:Medium (34-66%)] [2:Medium (34-66%)] [N/A:N/A] Exposed Structures: [1:Fascia:  No Fat Layer (Subcutaneous] [2:Fascia: No Fat Layer (Subcutaneous] [N/A:N/A] Tissue) Exposed: No Tissue) Exposed: No Tendon: No Tendon: No Muscle: No Muscle: No Joint: No Joint: No Bone: No Bone: No Limited to Skin Limited to Skin Breakdown Breakdown Epithelialization: Small (1-33%) Small (1-33%) N/A Debridement: Debridement (40981- Debridement (19147- N/A 11047) 11047) Pre-procedure 08:18 08:18 N/A Verification/Time Out Taken: Pain Control: Lidocaine 4% Topical Lidocaine 4% Topical N/A Solution Solution Tissue Debrided: Fibrin/Slough, Exudates, Fibrin/Slough, Exudates, N/A Subcutaneous Subcutaneous Level: Skin/Subcutaneous Skin/Subcutaneous N/A Tissue Tissue Debridement Area (sq 39 4.25 N/A cm): Instrument: Other(scoop) Other(scoop) N/A Bleeding: Minimum Minimum N/A Hemostasis Achieved: Pressure Pressure N/A Procedural Pain: 0 0 N/A Post Procedural Pain: 0 0 N/A Debridement Treatment Procedure was tolerated Procedure was tolerated N/A Response: well well Post Debridement 6.5x6x0.2 2.5x1.7x0.2 N/A Measurements L x W x D (cm) Post Debridement 6.126 0.668 N/A Volume: (cm) Periwound Skin Texture: No Abnormalities Noted No Abnormalities Noted N/A Periwound Skin Maceration: Yes Maceration: Yes N/A Moisture: Periwound Skin Color: No Abnormalities Noted No Abnormalities Noted N/A Temperature: No Abnormality No Abnormality N/A Tenderness on No Yes N/A Palpation: Wound Preparation: Ulcer Cleansing: Ulcer Cleansing: N/A Rinsed/Irrigated with Rinsed/Irrigated with Saline, Other: soap and Saline, Other: soap and water water Topical Anesthetic Topical Anesthetic Applied: None, Other: Applied: None, Other: lidocaine 4% lidocaine 4% Procedures Performed: Debridement Debridement N/A ORON, WESTRUP ([id number]) Treatment Notes Electronic Signature(s) Signed: 12/12/2016 7:57:33 AM By: [name] MD Entered By: [name] on 12/11/2016 08:25:07 [name] ([id number]) -------------------------------------------------------------------------------- Multi-Disciplinary Care Plan Details Patient Name: [name] Date of Service: 12/11/2016 8:00 AM Medical Record Number: [id number] Patient Account Number: [id number] Date of Birth/Sex: 1946/05/08 (71 y.o. Male) Treating RN: [name] Primary Care Latayna Ritchie: [name] Other Clinician: Referring Dennison Mcdaid: [name] Treating Carlye Panameno/Extender: [name] Weeks in Treatment: 47 Active Inactive ` Venous Leg Ulcer Nursing Diagnoses: Knowledge deficit related to disease process and management Goals: Patient will maintain optimal edema control Date Initiated: 04/03/2016 Target Resolution Date: 11/24/2016 Goal Status: Active Interventions: Compression as ordered Treatment Activities: Therapeutic compression applied : 04/03/2016 Notes: ` Wound/Skin Impairment Nursing Diagnoses: Impaired tissue integrity Goals: Ulcer/skin breakdown will heal within 14 weeks Date Initiated: 01/17/2016 Target Resolution Date: 11/24/2016 Goal Status: Active Interventions: Assess ulceration(s) every visit Notes: Electronic Signature(s) Signed: 12/11/2016 4:52:03 PM By: Aurelio Jew, [name] ([id number]) Entered By: Alejandro Mulling on 12/11/2016 08:15:58 [name] ([id number]) -------------------------------------------------------------------------------- Pain Assessment Details Patient Name: [name] Date of Service: 12/11/2016 8:00 AM Medical Record Number: [id number] Patient Account Number: [id number] Date of Birth/Sex: 07-Jul-1946 (71 y.o. Male) Treating RN: Ashok Cordia, [name] Primary Care Kerry Chisolm: [name] Other Clinician: Referring Leyton Magoon: [name] Treating Macon Lesesne/Extender: [name] Weeks in Treatment: 47 Active Problems Location of Pain Severity and Description of Pain Patient Has Paino No Site Locations With  Dressing Change: No Pain  Management and Medication Current Pain Management: Electronic Signature(s) Signed: 12/11/2016 4:52:03 PM By: Alejandro Mulling Entered By: Alejandro Mulling on 12/11/2016 08:03:43 [name] ([id number]) -------------------------------------------------------------------------------- Patient/Caregiver Education Details [name] Date of Service: 12/11/2016 8:00 AM Patient Name: C. Patient Account Number: [id number] Medical Record Treating RN: [name] [id number] Number: Other Clinician: Date of Birth/Gender: [birth year] (71 y.o. Male) Treating [name], [name] Primary Care Physician: [name] Physician/Extender: G Referring Physician: [name] in Treatment: 67 Education Assessment Education Provided To: Patient Education Topics Provided Wound/Skin Impairment: Handouts: Other: change dressing as ordered Methods: Demonstration, Explain/Verbal Responses: State content correctly Electronic Signature(s) Signed: 12/11/2016 4:52:03 PM By: Alejandro Mulling Entered By: Alejandro Mulling on 12/11/2016 08:18:45 [name] ([id number]) -------------------------------------------------------------------------------- Wound Assessment Details Patient Name: [name] Date of Service: 12/11/2016 8:00 AM Medical Record Number: [id number] Patient Account Number: [id number] Date of Birth/Sex: 05-15-1946 (71 y.o. Male) Treating RN: Ashok Cordia, [name] Primary Care [name] [name]: [name] Other Clinician: Referring Anthonee Gelin: [name] Treating Levar Fayson/Extender: [name] Weeks in Treatment: 47 Wound Status Wound Number: 1 Primary Venous Leg Ulcer Etiology: Wound Location: Right Malleolus - Lateral Wound Open Wounding Event: Gradually Appeared Status: Date Acquired: 08/11/2015 Comorbid Cataracts, Chronic Obstructive Weeks Of Treatment: 47 History: Pulmonary Disease (COPD), Clustered Wound:  No Osteoarthritis Photos Photo Uploaded By: Alejandro Mulling on 12/11/2016 16:40:01 Wound Measurements Length: (cm) 6.5 Width: (cm) 6 Depth: (cm) 0.2 Area: (cm) 30.631 Volume: (cm) 6.126 % Reduction in Area: -77.3% % Reduction in Volume: -18.2% Epithelialization: Small (1-33%) Tunneling: No Undermining: No Wound Description Full Thickness Without Exposed Foul Odor Afte Classification: Support Structures Slough/Fibrino Wound Margin: Distinct, outline attached Exudate Large Amount: Exudate Type: Serosanguineous Exudate Color: red, brown r Cleansing: No Yes Wound Bed Granulation Amount: Medium (34-66%) Exposed Structure Granulation Quality: Red Fascia Exposed: No Joaquin, Jeanpaul C. ([id number]) Necrotic Amount: Medium (34-66%) Fat Layer (Subcutaneous Tissue) Exposed: No Necrotic Quality: Adherent Slough Tendon Exposed: No Muscle Exposed: No Joint Exposed: No Bone Exposed: No Limited to Skin Breakdown Periwound Skin Texture Texture Color No Abnormalities Noted: No No Abnormalities Noted: No Moisture Temperature / Pain No Abnormalities Noted: No Temperature: No Abnormality Maceration: Yes Wound Preparation Ulcer Cleansing: Rinsed/Irrigated with Saline, Other: soap and water, Topical Anesthetic Applied: None, Other: lidocaine 4%, Treatment Notes Wound #1 (Right, Lateral Malleolus) 1. Cleansed with: Clean wound with Normal Saline Cleanse wound with antibacterial soap and water 2. Anesthetic Topical Lidocaine 4% cream to wound bed prior to debridement 3. Peri-wound Care: Barrier cream Other peri-wound care (specify in notes) 4. Dressing Applied: Hydrogel Other dressing (specify in notes) 5. Secondary Dressing Applied ABD Pad 7. Secured with Tape 3 Layer Compression System - Right Lower Extremity Notes sorbact, drawtex Electronic Signature(s) Signed: 12/11/2016 4:52:03 PM By: Alejandro Mulling Entered By: Alejandro Mulling on 12/11/2016  08:13:22 [name] ([id number]) -------------------------------------------------------------------------------- Wound Assessment Details Patient Name: [name] Date of Service: 12/11/2016 8:00 AM Medical Record Number: [id number] Patient Account Number: [id number] Date of Birth/Sex: 07-02-1946 (70 y.o. Male) Treating RN: [name] Primary Care Jesus Nevills: [name] Other Clinician: Referring Darryel Diodato: [name] Treating Trason Shifflet/Extender: [name] Weeks in Treatment: 47 Wound Status Wound Number: 2 Primary Venous Leg Ulcer Etiology: Wound Location: Right Malleolus - Medial Wound Open Wounding Event: Gradually Appeared Status: Date Acquired: 01/30/2016 Comorbid Cataracts, Chronic Obstructive Weeks Of Treatment: 45 History: Pulmonary Disease (COPD), Clustered Wound: Yes Osteoarthritis Photos Photo Uploaded By: Alejandro Mulling on 12/11/2016 16:40:20 Wound Measurements Length: (cm) 2.5 Width: (cm) 1.7 Depth: (cm) 0.2  Area: (cm) 3.338 Volume: (cm) 0.668 % Reduction in Area: 71.1% % Reduction in Volume: 42.2% Epithelialization: Small (1-33%) Tunneling: No Undermining: No Wound Description Full Thickness Without Exposed Foul Odor Afte Classification: Support Structures Slough/Fibrino Wound Margin: Distinct, outline attached Exudate Large Amount: Exudate Type: Serosanguineous Exudate Color: red, brown r Cleansing: No Yes Wound Bed Granulation Amount: Medium (34-66%) Exposed Structure Granulation Quality: Red Fascia Exposed: No Menz, Jamaris C. ([id number]) Necrotic Amount: Medium (34-66%) Fat Layer (Subcutaneous Tissue) Exposed: No Necrotic Quality: Adherent Slough Tendon Exposed: No Muscle Exposed: No Joint Exposed: No Bone Exposed: No Limited to Skin Breakdown Periwound Skin Texture Texture Color No Abnormalities Noted: No No Abnormalities Noted: No Moisture Temperature / Pain No Abnormalities Noted:  No Temperature: No Abnormality Maceration: Yes Tenderness on Palpation: Yes Wound Preparation Ulcer Cleansing: Rinsed/Irrigated with Saline, Other: soap and water, Topical Anesthetic Applied: None, Other: lidocaine 4%, Treatment Notes Wound #2 (Right, Medial Malleolus) 1. Cleansed with: Clean wound with Normal Saline Cleanse wound with antibacterial soap and water 2. Anesthetic Topical Lidocaine 4% cream to wound bed prior to debridement 3. Peri-wound Care: Barrier cream Other peri-wound care (specify in notes) 4. Dressing Applied: Hydrogel Other dressing (specify in notes) 5. Secondary Dressing Applied ABD Pad 7. Secured with Tape 3 Layer Compression System - Right Lower Extremity Notes sorbact, drawtex Electronic Signature(s) Signed: 12/11/2016 4:52:03 PM By: Alejandro Mulling Entered By: Alejandro Mulling on 12/11/2016 08:13:56 [name] ([id number]) -------------------------------------------------------------------------------- Vitals Details Patient Name: [name] Date of Service: 12/11/2016 8:00 AM Medical Record Number: [id number] Patient Account Number: [id number] Date of Birth/Sex: 10/25/1945 (71 y.o. Male) Treating RN: Ashok Cordia, [name] Primary Care Ewell Benassi: [name] Other Clinician: Referring Jarmon Javid: [name] Treating Amman Bartel/Extender: [name] Weeks in Treatment: 47 Vital Signs Time Taken: 08:03 Temperature (F): 97.5 Height (in): 76 Pulse (bpm): 71 Weight (lbs): 238 Respiratory Rate (breaths/min): 18 Body Mass Index (BMI): 29 Blood Pressure (mmHg): 169/77 Reference Range: 80 - 120 mg / dl Electronic Signature(s) Signed: 12/11/2016 4:52:03 PM By: Alejandro Mulling Entered By: Alejandro Mulling on 12/11/2016 08:05:44
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
## 2016-12-14 DIAGNOSIS — I87331 Chronic venous hypertension (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: Secondary | ICD-10-CM | POA: Diagnosis not present

## 2016-12-16 NOTE — Progress Notes (Signed)
[name] ([id number]) Visit Report for 12/14/2016 Arrival Information Details Patient Name: [name], [name]. Date of Service: 12/14/2016 8:00 AM Medical Record Number: [id number] Patient Account Number: [id number] Date of Birth/Sex: May 06, 1946 (71 y.o. Male) Treating RN: [name] Primary Care Mihir Flanigan: [name] Other Clinician: Referring Hy Swiatek: [name] Treating Mahmud Keithly/Extender: Kathreen Cosier in Treatment: 55 Visit Information History Since Last Visit All ordered tests and consults were completed: No Patient Arrived: Ambulatory Added or deleted any medications: No Arrival Time: 08:04 Any new allergies or adverse reactions: No Accompanied By: self Had a fall or experienced change in No Transfer Assistance: None activities of daily living that may affect Patient Identification Verified: Yes risk of falls: Secondary Verification Process Yes Signs or symptoms of abuse/neglect since last No Completed: visito Patient Requires Transmission-Based No Hospitalized since last visit: No Precautions: Has Dressing in Place as Prescribed: Yes Patient Has Alerts: No Has Compression in Place as Prescribed: Yes Pain Present Now: No Electronic Signature(s) Signed: 12/14/2016 4:12:19 PM By: Alejandro Mulling Entered By: Alejandro Mulling on 12/14/2016 08:08:09 [name] ([id number]) -------------------------------------------------------------------------------- Encounter Discharge Information Details Patient Name: [name] Date of Service: 12/14/2016 8:00 AM Medical Record Number: [id number] Patient Account Number: [id number] Date of Birth/Sex: Feb 22, 1946 (71 y.o. Male) Treating RN: [name] Primary Care Odell Choung: [name] Other Clinician: Referring Mikhia Dusek: [name] Treating Arlan Birks/Extender: Kathreen Cosier in Treatment: 51 Encounter Discharge Information Items Discharge Pain Level: 0 Discharge Condition:  Stable Ambulatory Status: Ambulatory Discharge Destination: Home Private Transportation: Auto Accompanied By: self Schedule Follow-up Appointment: Yes Medication Reconciliation completed and No provided to Patient/Care Sanita Estrada: Clinical Summary of Care: Electronic Signature(s) Signed: 12/14/2016 4:12:19 PM By: Alejandro Mulling Entered By: Alejandro Mulling on 12/14/2016 08:13:12 [name] ([id number]) -------------------------------------------------------------------------------- Patient/Caregiver Education Details Patient Name: [name] Date of Service: 12/14/2016 8:00 AM Medical Record Number: [id number] Patient Account Number: [id number] Date of Birth/Gender: 02-15-1946 (71 y.o. Male) Treating RN: [name] Primary Care Physician: [name] Other Clinician: Referring Physician: [name] Treating Physician/Extender: Kathreen Cosier in Treatment: 36 Education Assessment Education Provided To: Patient Education Topics Provided Wound/Skin Impairment: Handouts: Other: change dressing as ordered Methods: Demonstration, Explain/Verbal Responses: State content correctly Electronic Signature(s) Signed: 12/14/2016 4:12:19 PM By: Alejandro Mulling Entered By: Alejandro Mulling on 12/14/2016 08:12:59 [name] ([id number]) -------------------------------------------------------------------------------- Wound Assessment Details Patient Name: [name] Date of Service: 12/14/2016 8:00 AM Medical Record Number: [id number] Patient Account Number: [id number] Date of Birth/Sex: June 28, 1946 (71 y.o. Male) Treating RN: Ashok Cordia, [name] Primary Care Vlad Mayberry: [name] Other Clinician: Referring Abdulai Blaylock: [name] Treating Armany Mano/Extender: Kathreen Cosier in Treatment: 47 Wound Status Wound Number: 1 Primary Venous Leg Ulcer Etiology: Wound Location: Right Malleolus - Lateral Wound Open Wounding Event: Gradually  Appeared Status: Date Acquired: 08/11/2015 Comorbid Cataracts, Chronic Obstructive Weeks Of Treatment: 47 History: Pulmonary Disease (COPD), Clustered Wound: No Osteoarthritis Photos Photo Uploaded By: Alejandro Mulling on 12/14/2016 14:25:58 Wound Measurements Length: (cm) 6.5 Width: (cm) 6 Depth: (cm) 0.2 Area: (cm) 30.631 Volume: (cm) 6.126 % Reduction in Area: -77.3% % Reduction in Volume: -18.2% Epithelialization: Small (1-33%) Tunneling: No Undermining: No Wound Description Full Thickness Without Exposed Classification: Support Structures Wound Margin: Distinct, outline attached Exudate Large Amount: Exudate Type: Serosanguineous Exudate Color: red, brown Foul Odor After Cleansing: No Slough/Fibrino Yes Wound Bed Granulation Amount: Medium (34-66%) Exposed Structure Granulation Quality: Red Fascia Exposed: No Strollo, Anothy C. ([id number]) Necrotic Amount: Medium (34-66%) Fat Layer (Subcutaneous Tissue) Exposed: No Necrotic Quality: Adherent  Slough Tendon Exposed: No Muscle Exposed: No Joint Exposed: No Bone Exposed: No Limited to Skin Breakdown Periwound Skin Texture Texture Color No Abnormalities Noted: No No Abnormalities Noted: No Moisture Temperature / Pain No Abnormalities Noted: No Temperature: No Abnormality Maceration: Yes Wound Preparation Ulcer Cleansing: Rinsed/Irrigated with Saline, Other: soap and water, Topical Anesthetic Applied: None, Other: lidocaine 4%, Treatment Notes Wound #1 (Right, Lateral Malleolus) 1. Cleansed with: Clean wound with Normal Saline Cleanse wound with antibacterial soap and water 4. Dressing Applied: Hydrogel Other dressing (specify in notes) 5. Secondary Dressing Applied ABD Pad 7. Secured with Tape 3 Layer Compression System - Right Lower Extremity Notes drawtex, sorbact Electronic Signature(s) Signed: 12/14/2016 4:12:19 PM By: Alejandro Mulling Entered By: Alejandro Mulling on 12/14/2016  08:09:09 [name] ([id number]) -------------------------------------------------------------------------------- Wound Assessment Details Patient Name: [name] Date of Service: 12/14/2016 8:00 AM Medical Record Number: [id number] Patient Account Number: [id number] Date of Birth/Sex: [birth year] (71 y.o. Male) Treating RN: Ashok Cordia, [name] Primary Care Mirela Parsley: [name] Other Clinician: Referring Donna Silverman: [name] Treating Judyann Casasola/Extender: Kathreen Cosier in Treatment: 47 Wound Status Wound Number: 2 Primary Venous Leg Ulcer Etiology: Wound Location: Right Malleolus - Medial Wound Open Wounding Event: Gradually Appeared Status: Date Acquired: 01/30/2016 Comorbid Cataracts, Chronic Obstructive Weeks Of Treatment: 45 History: Pulmonary Disease (COPD), Clustered Wound: Yes Osteoarthritis Photos Photo Uploaded By: Alejandro Mulling on 12/14/2016 14:25:59 Wound Measurements Length: (cm) 2.5 Width: (cm) 1.7 Depth: (cm) 0.2 Area: (cm) 3.338 Volume: (cm) 0.668 % Reduction in Area: 71.1% % Reduction in Volume: 42.2% Epithelialization: Small (1-33%) Tunneling: No Undermining: No Wound Description Full Thickness Without Exposed Classification: Support Structures Wound Margin: Distinct, outline attached Exudate Large Amount: Exudate Type: Serosanguineous Exudate Color: red, brown Foul Odor After Cleansing: No Slough/Fibrino Yes Wound Bed Granulation Amount: Medium (34-66%) Exposed Structure Granulation Quality: Red Fascia Exposed: No Helmer, Margie C. ([id number]) Necrotic Amount: Medium (34-66%) Fat Layer (Subcutaneous Tissue) Exposed: No Necrotic Quality: Adherent Slough Tendon Exposed: No Muscle Exposed: No Joint Exposed: No Bone Exposed: No Limited to Skin Breakdown Periwound Skin Texture Texture Color No Abnormalities Noted: No No Abnormalities Noted: No Moisture Temperature / Pain No Abnormalities Noted:  No Temperature: No Abnormality Maceration: Yes Tenderness on Palpation: Yes Wound Preparation Ulcer Cleansing: Rinsed/Irrigated with Saline, Other: soap and water, Topical Anesthetic Applied: None, Other: lidocaine 4%, Treatment Notes Wound #2 (Right, Medial Malleolus) 1. Cleansed with: Clean wound with Normal Saline Cleanse wound with antibacterial soap and water 4. Dressing Applied: Hydrogel Other dressing (specify in notes) 5. Secondary Dressing Applied ABD Pad 7. Secured with Tape 3 Layer Compression System - Right Lower Extremity Notes drawtex, sorbact Electronic Signature(s) Signed: 12/14/2016 4:12:19 PM By: Alejandro Mulling Entered By: Alejandro Mulling on 12/14/2016 08:11:43

## 2016-12-18 ENCOUNTER — Encounter: Payer: Medicare HMO | Admitting: Internal Medicine

## 2016-12-18 DIAGNOSIS — I87331 Chronic venous hypertension (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: Secondary | ICD-10-CM | POA: Diagnosis not present

## 2016-12-19 NOTE — Progress Notes (Signed)
[name] ([id number]) Visit Report for 12/18/2016 Arrival Information Details Patient Name: [name], [name]. Date of Service: 12/18/2016 8:00 AM Medical Record Number: [id number] Patient Account Number: [id number] Date of Birth/Sex: 07-11-1946 (71 y.o. Male) Treating RN: [name] Primary Care Yecenia Dalgleish: [name] Other Clinician: Referring Dimetrius Montfort: [name] Treating Kalah Pflum/Extender: Altamese [place] in Treatment: 48 Visit Information History Since Last Visit All ordered tests and consults were completed: No Patient Arrived: Ambulatory Added or deleted any medications: No Arrival Time: 08:01 Any new allergies or adverse reactions: No Accompanied By: self Had a fall or experienced change in No Transfer Assistance: None activities of daily living that may affect Patient Identification Verified: Yes risk of falls: Secondary Verification Process Yes Signs or symptoms of abuse/neglect since last No Completed: visito Patient Requires Transmission-Based No Hospitalized since last visit: No Precautions: Has Dressing in Place as Prescribed: Yes Patient Has Alerts: No Has Compression in Place as Prescribed: Yes Pain Present Now: No Electronic Signature(s) Signed: 12/18/2016 4:19:56 PM By: Alejandro Mulling Entered By: Alejandro Mulling on 12/18/2016 08:05:09 [name] ([id number]) -------------------------------------------------------------------------------- Encounter Discharge Information Details Patient Name: [name] Date of Service: 12/18/2016 8:00 AM Medical Record Number: [id number] Patient Account Number: [id number] Date of Birth/Sex: 12/10/1945 (71 y.o. Male) Treating RN: [name] Primary Care Pasquale Matters: [name] Other Clinician: Referring Duward Allbritton: [name] Treating Khamryn Calderone/Extender: [name] Weeks in Treatment: 48 Encounter Discharge Information Items Discharge Pain Level: 0 Discharge  Condition: Stable Ambulatory Status: Ambulatory Discharge Destination: Home Transportation: Private Auto Accompanied By: self Schedule Follow-up Appointment: Yes Medication Reconciliation completed No and provided to Patient/Care Larina Lieurance: Patient Clinical Summary of Care: Declined Electronic Signature(s) Signed: 12/18/2016 10:40:36 AM By: Gwenlyn Perking Entered By: Gwenlyn Perking on 12/18/2016 10:40:35 [name] ([id number]) -------------------------------------------------------------------------------- Lower Extremity Assessment Details Patient Name: [name] Date of Service: 12/18/2016 8:00 AM Medical Record Number: [id number] Patient Account Number: [id number] Date of Birth/Sex: 01/04/1946 (71 y.o. Male) Treating RN: Ashok Cordia, [name] Primary Care Chalisa Kobler: [name] Other Clinician: Referring Keighan Amezcua: [name] Treating Bertin Inabinet/Extender: [name] Weeks in Treatment: 48 Edema Assessment Assessed: [Left: No] [Right: No] E[Left: dema] [Right: :] Calf Left: Right: Point of Measurement: 40 cm From Medial Instep cm 34.2 cm Ankle Left: Right: Point of Measurement: 12 cm From Medial Instep cm 22 cm Vascular Assessment Pulses: Dorsalis Pedis Palpable: [Right:Yes] Posterior Tibial Extremity colors, hair growth, and conditions: Extremity Color: [Right:Normal] Temperature of Extremity: [Right:Warm] Capillary Refill: [Right:< 3 seconds] Toe Nail Assessment Left: Right: Thick: Yes Discolored: Yes Deformed: No Improper Length and Hygiene: No Electronic Signature(s) Signed: 12/18/2016 4:19:56 PM By: Alejandro Mulling Entered By: Alejandro Mulling on 12/18/2016 08:18:20 [name] ([id number]) -------------------------------------------------------------------------------- Multi Wound Chart Details Patient Name: [name] Date of Service: 12/18/2016 8:00 AM Medical Record Number: [id number] Patient Account Number:  [id number] Date of Birth/Sex: 26-Mar-1946 (71 y.o. Male) Treating RN: Ashok Cordia, [name] Primary Care Kentley Blyden: [name] Other Clinician: Referring Amalia Edgecombe: [name] Treating Kaiya Boatman/Extender: [name] Weeks in Treatment: 48 Vital Signs Height(in): 76 Pulse(bpm): 74 Weight(lbs): 238 Blood Pressure 151/68 (mmHg): Body Mass Index(BMI): 29 Temperature(F): 98.2 Respiratory Rate 18 (breaths/min): Photos: [1:No Photos] [2:No Photos] [N/A:N/A] Wound Location: [1:Right Malleolus - Lateral] [2:Right Malleolus - Medial] [N/A:N/A] Wounding Event: [1:Gradually Appeared] [2:Gradually Appeared] [N/A:N/A] Primary Etiology: [1:Venous Leg Ulcer] [2:Venous Leg Ulcer] [N/A:N/A] Comorbid History: [1:Cataracts, Chronic Obstructive Pulmonary Disease (COPD), Osteoarthritis] [2:Cataracts, Chronic Obstructive Pulmonary Disease (COPD), Osteoarthritis] [N/A:N/A] Date Acquired: [1:08/11/2015] [2:01/30/2016] [N/A:N/A] Weeks of Treatment: [1:48] [  2:46] [N/A:N/A] Wound Status: [1:Open] [2:Open] [N/A:N/A] Clustered Wound: [1:No] [2:Yes] [N/A:N/A] Measurements L x W x D 5.5x6x0.2 [2:2.2x1.5x0.2] [N/A:N/A] (cm) Area (cm) : [1:25.918] [2:2.592] [N/A:N/A] Volume (cm) : [1:5.184] [2:0.518] [N/A:N/A] % Reduction in Area: [1:-50.00%] [2:77.50%] [N/A:N/A] % Reduction in Volume: 0.00% [2:55.20%] [N/A:N/A] Classification: [1:Full Thickness Without Exposed Support Structures] [2:Full Thickness Without Exposed Support Structures] [N/A:N/A] Exudate Amount: [1:Large] [2:Large] [N/A:N/A] Exudate Type: [1:Serosanguineous] [2:Serosanguineous] [N/A:N/A] Exudate Color: [1:red, brown] [2:red, brown] [N/A:N/A] Wound Margin: [1:Distinct, outline attached] [2:Distinct, outline attached] [N/A:N/A] Granulation Amount: [1:Medium (34-66%)] [2:Medium (34-66%)] [N/A:N/A] Granulation Quality: [1:Red] [2:Red] [N/A:N/A] Necrotic Amount: [1:Medium (34-66%)] [2:Medium (34-66%)] [N/A:N/A] Exposed Structures: [1:Fascia: No  Fat Layer (Subcutaneous] [2:Fascia: No Fat Layer (Subcutaneous] [N/A:N/A] Tissue) Exposed: No Tissue) Exposed: No Tendon: No Tendon: No Muscle: No Muscle: No Joint: No Joint: No Bone: No Bone: No Limited to Skin Limited to Skin Breakdown Breakdown Epithelialization: Small (1-33%) Small (1-33%) N/A Debridement: Debridement (52841- N/A N/A 11047) Pre-procedure 08:20 N/A N/A Verification/Time Out Taken: Pain Control: Lidocaine 4% Topical N/A N/A Solution Tissue Debrided: Fibrin/Slough, Exudates, N/A N/A Subcutaneous Level: Skin/Subcutaneous N/A N/A Tissue Debridement Area (sq 33 N/A N/A cm): Instrument: Other(scoop) N/A N/A Bleeding: Minimum N/A N/A Hemostasis Achieved: Pressure N/A N/A Procedural Pain: 0 N/A N/A Post Procedural Pain: 0 N/A N/A Debridement Treatment Procedure was tolerated N/A N/A Response: well Post Debridement 5.5x6x0.2 N/A N/A Measurements L x W x D (cm) Post Debridement 5.184 N/A N/A Volume: (cm) Periwound Skin Texture: No Abnormalities Noted No Abnormalities Noted N/A Periwound Skin Maceration: Yes Maceration: Yes N/A Moisture: Periwound Skin Color: No Abnormalities Noted No Abnormalities Noted N/A Temperature: No Abnormality No Abnormality N/A Tenderness on No Yes N/A Palpation: Wound Preparation: Ulcer Cleansing: Ulcer Cleansing: N/A Rinsed/Irrigated with Rinsed/Irrigated with Saline, Other: soap and Saline, Other: soap and water water Topical Anesthetic Topical Anesthetic Applied: None, Other: Applied: None, Other: lidocaine 4% lidocaine 4% Procedures Performed: Debridement N/A N/A EARLIE, SCHANK ([id number]) Treatment Notes Electronic Signature(s) Signed: 12/19/2016 7:39:10 AM By: [name] MD Entered By: [name] on 12/18/2016 08:32:13 [name] ([id number]) -------------------------------------------------------------------------------- Multi-Disciplinary Care Plan Details Patient Name: [name] Date of Service: 12/18/2016 8:00 AM Medical Record Number: [id number] Patient Account Number: [id number] Date of Birth/Sex: 1945-11-04 (71 y.o. Male) Treating RN: [name] Primary Care Nicle Connole: [name] Other Clinician: Referring Cheresa Siers: [name] Treating Lexany Belknap/Extender: [name] Weeks in Treatment: 48 Active Inactive ` Venous Leg Ulcer Nursing Diagnoses: Knowledge deficit related to disease process and management Goals: Patient will maintain optimal edema control Date Initiated: 04/03/2016 Target Resolution Date: 11/24/2016 Goal Status: Active Interventions: Compression as ordered Treatment Activities: Therapeutic compression applied : 04/03/2016 Notes: ` Wound/Skin Impairment Nursing Diagnoses: Impaired tissue integrity Goals: Ulcer/skin breakdown will heal within 14 weeks Date Initiated: 01/17/2016 Target Resolution Date: 11/24/2016 Goal Status: Active Interventions: Assess ulceration(s) every visit Notes: Electronic Signature(s) Signed: 12/18/2016 4:19:56 PM By: Aurelio Jew, [name] ([id number]) Entered By: Alejandro Mulling on 12/18/2016 08:18:29 [name] ([id number]) -------------------------------------------------------------------------------- Pain Assessment Details Patient Name: [name] Date of Service: 12/18/2016 8:00 AM Medical Record Number: [id number] Patient Account Number: [id number] Date of Birth/Sex: July 05, 1946 (71 y.o. Male) Treating RN: [name] Primary Care Aryaa Bunting: [name] Other Clinician: Referring Zahriyah Joo: [name] Treating Darrah Dredge/Extender: [name] Weeks in Treatment: 48 Active Problems Location of Pain Severity and Description of Pain Patient Has Paino No Site Locations With Dressing Change: No Pain Management and Medication Current Pain Management: Electronic Signature(s) Signed: 12/18/2016 4:19:56 PM  By: Alejandro Mulling Entered By:  Alejandro Mulling on 12/18/2016 08:05:16 [name] ([id number]) -------------------------------------------------------------------------------- Patient/Caregiver Education Details [name] Date of Service: 12/18/2016 8:00 AM Patient Name: C. Patient Account Number: [id number] Medical Record Treating RN: [name] [id number] Number: Other Clinician: Date of Birth/Gender: 12/27/1945 (71 y.o. Male) Treating [name], [name] Primary Care Physician: [name] Physician/Extender: G Referring Physician: [name] in Treatment: 40 Education Assessment Education Provided To: Patient Education Topics Provided Wound/Skin Impairment: Handouts: Other: change dressing as ordered Methods: Demonstration, Explain/Verbal Responses: State content correctly Electronic Signature(s) Signed: 12/18/2016 4:19:56 PM By: Alejandro Mulling Entered By: Alejandro Mulling on 12/18/2016 08:57:20 [name] ([id number]) -------------------------------------------------------------------------------- Wound Assessment Details Patient Name: [name] Date of Service: 12/18/2016 8:00 AM Medical Record Number: [id number] Patient Account Number: [id number] Date of Birth/Sex: 13-Oct-1945 (71 y.o. Male) Treating RN: Ashok Cordia, [name] Primary Care Aziah Kaiser: [name] Other Clinician: Referring Gerik Coberly: [name] Treating Yarely Bebee/Extender: [name] Weeks in Treatment: 48 Wound Status Wound Number: 1 Primary Venous Leg Ulcer Etiology: Wound Location: Right Malleolus - Lateral Wound Open Wounding Event: Gradually Appeared Status: Date Acquired: 08/11/2015 Comorbid Cataracts, Chronic Obstructive Weeks Of Treatment: 48 History: Pulmonary Disease (COPD), Clustered Wound: No Osteoarthritis Photos Photo Uploaded By: Alejandro Mulling on 12/18/2016 10:43:27 Wound Measurements Length: (cm) 5.5 Width: (cm) 6 Depth: (cm) 0.2 Area: (cm)  25.918 Volume: (cm) 5.184 % Reduction in Area: -50% % Reduction in Volume: 0% Epithelialization: Small (1-33%) Tunneling: No Undermining: No Wound Description Full Thickness Without Exposed Foul Odor Afte Classification: Support Structures Slough/Fibrino Wound Margin: Distinct, outline attached Exudate Large Amount: Exudate Type: Serosanguineous Exudate Color: red, brown r Cleansing: No Yes Wound Bed Granulation Amount: Medium (34-66%) Exposed Structure Granulation Quality: Red Fascia Exposed: No Moten, Ceaser C. ([id number]) Necrotic Amount: Medium (34-66%) Fat Layer (Subcutaneous Tissue) Exposed: No Necrotic Quality: Adherent Slough Tendon Exposed: No Muscle Exposed: No Joint Exposed: No Bone Exposed: No Limited to Skin Breakdown Periwound Skin Texture Texture Color No Abnormalities Noted: No No Abnormalities Noted: No Moisture Temperature / Pain No Abnormalities Noted: No Temperature: No Abnormality Maceration: Yes Wound Preparation Ulcer Cleansing: Rinsed/Irrigated with Saline, Other: soap and water, Topical Anesthetic Applied: None, Other: lidocaine 4%, Treatment Notes Wound #1 (Right, Lateral Malleolus) 1. Cleansed with: Clean wound with Normal Saline Cleanse wound with antibacterial soap and water 2. Anesthetic Topical Lidocaine 4% cream to wound bed prior to debridement 3. Peri-wound Care: Barrier cream 4. Dressing Applied: Hydrogel Other dressing (specify in notes) 5. Secondary Dressing Applied ABD Pad 7. Secured with Tape 3 Layer Compression System - Right Lower Extremity Notes sorbact, xtrasorb, drawtex Electronic Signature(s) Signed: 12/18/2016 4:19:56 PM By: Alejandro Mulling Entered By: Alejandro Mulling on 12/18/2016 08:15:58 [name] ([id number]) -------------------------------------------------------------------------------- Wound Assessment Details Patient Name: [name] Date of Service: 12/18/2016 8:00  AM Medical Record Number: [id number] Patient Account Number: [id number] Date of Birth/Sex: 09-Jun-1946 (71 y.o. Male) Treating RN: Ashok Cordia, [name] Primary Care Waller Marcussen: [name] Other Clinician: Referring Kylen Schliep: [name] Treating Hart Haas/Extender: [name] Weeks in Treatment: 48 Wound Status Wound Number: 2 Primary Venous Leg Ulcer Etiology: Wound Location: Right Malleolus - Medial Wound Open Wounding Event: Gradually Appeared Status: Date Acquired: 01/30/2016 Comorbid Cataracts, Chronic Obstructive Weeks Of Treatment: 46 History: Pulmonary Disease (COPD), Clustered Wound: Yes Osteoarthritis Photos Photo Uploaded By: Alejandro Mulling on 12/18/2016 10:43:57 Wound Measurements Length: (cm) 2.2 Width: (cm) 1.5 Depth: (cm) 0.2 Area: (cm) 2.592 Volume: (cm) 0.518 % Reduction in Area: 77.5% % Reduction in Volume: 55.2%  Epithelialization: Small (1-33%) Tunneling: No Undermining: No Wound Description Full Thickness Without Exposed Foul Odor Afte Classification: Support Structures Slough/Fibrino Wound Margin: Distinct, outline attached Exudate Large Amount: Exudate Type: Serosanguineous Exudate Color: red, brown r Cleansing: No Yes Wound Bed Granulation Amount: Medium (34-66%) Exposed Structure Granulation Quality: Red Fascia Exposed: No [place], Virl C. ([id number]) Necrotic Amount: Medium (34-66%) Fat Layer (Subcutaneous Tissue) Exposed: No Necrotic Quality: Adherent Slough Tendon Exposed: No Muscle Exposed: No Joint Exposed: No Bone Exposed: No Limited to Skin Breakdown Periwound Skin Texture Texture Color No Abnormalities Noted: No No Abnormalities Noted: No Moisture Temperature / Pain No Abnormalities Noted: No Temperature: No Abnormality Maceration: Yes Tenderness on Palpation: Yes Wound Preparation Ulcer Cleansing: Rinsed/Irrigated with Saline, Other: soap and water, Topical Anesthetic Applied: None, Other: lidocaine  4%, Treatment Notes Wound #2 (Right, Medial Malleolus) 1. Cleansed with: Clean wound with Normal Saline Cleanse wound with antibacterial soap and water 2. Anesthetic Topical Lidocaine 4% cream to wound bed prior to debridement 3. Peri-wound Care: Barrier cream 4. Dressing Applied: Hydrogel Other dressing (specify in notes) 5. Secondary Dressing Applied ABD Pad 7. Secured with Tape 3 Layer Compression System - Right Lower Extremity Notes sorbact, drawtex Electronic Signature(s) Signed: 12/18/2016 4:19:56 PM By: Alejandro Mulling Entered By: Alejandro Mulling on 12/18/2016 08:16:46 [name] ([id number]) -------------------------------------------------------------------------------- Vitals Details Patient Name: [name] Date of Service: 12/18/2016 8:00 AM Medical Record Number: [id number] Patient Account Number: [id number] Date of Birth/Sex: 01/18/1946 (71 y.o. Male) Treating RN: Ashok Cordia, [name] Primary Care Rashay Barnette: [name] Other Clinician: Referring Trueman Worlds: [name] Treating Ananth Fiallos/Extender: [name] Weeks in Treatment: 48 Vital Signs Time Taken: 08:05 Temperature (F): 98.2 Height (in): 76 Pulse (bpm): 74 Weight (lbs): 238 Respiratory Rate (breaths/min): 18 Body Mass Index (BMI): 29 Blood Pressure (mmHg): 151/68 Reference Range: 80 - 120 mg / dl Electronic Signature(s) Signed: 12/18/2016 4:19:56 PM By: Alejandro Mulling Entered By: Alejandro Mulling on 12/18/2016 08:06:47

## 2016-12-19 NOTE — Progress Notes (Addendum)
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 debridement. Patient has no complaints 01/31/16; the patient arrives with irritation on the medial aspect of his ankle noticed by her intake nurse. The patient is noted pain in the area over the last day or 2. There are four new tiny wounds in this area. His co- pay for TheraSkin application is really high I think beyond her means 02/07/16; patient is improved C+S cultures MSSA completed Doxy. using iodoflex 02/15/16; patient arrived today with the wound and roughly the same condition. Extensive area on the right lateral foot and ankle. Using Iodoflex. He came in last week with a cluster of new wounds on the medial aspect of the same ankle. 02/22/16; once again the patient complains of a lot of drainage coming out of this wound. We brought him back in on Friday for a dressing change has been using Iodoflex. States his pain level is better 02/29/16; still complaining of a lot of drainage even though we are putting absorbent material over the Santyl and bringing him back on  Fridays for dressing changes. He is not complaining of pain. Her intake nurse notes blistering 03/07/16: pt returns today for f/u. he admits out in rain on Saturday and soaked his right leg. he did not share with his wife and he didn't notify the [facility]. he has an odor today that is c/w pseudomonas. Wound has greenish tan slough. there is no periwound erythema, induration, or fluctuance. wound has deteriorated since previous visit. denies fever, chills, body aches or malaise. no increased pain. 03/13/16: C+S showed proteus. He has not received AB'S. Switched to RTD last week. 03/27/16 patient is been using Iodoflex. Wound bed has improved and debridement is certainly easier 04/10/2016 -- he has been scheduled for a venous duplex study towards the end of the month 04/17/16; has been using silver alginate, states that the Iodoflex was hurting his wound and since that is been changed he has had no pain unfortunately the surface of the wound continues to be unhealthy with Zumstein, Collyn C. ([id number]) thick gelatinous slough and nonviable tissue. The wound will not heal like this. 04/20/2016 -- the patient was here for a nurse visit but I was asked to see the patient as the slough was quite significant and the nurse needed for clarification regarding the ointment to be used. 04/24/16; the patient's wounds on the right medial and right lateral ankle/malleolus both look a lot better today. Less adherent slough healthier tissue. Dimensions better especially medially 05/01/16; the patient's wound surface continues to improve however he continues to require debridement switch her easier each  week. Continue Santyl/Metahydrin mixture Hydrofera Blue next week. Still drainage on the medial aspect according to the intake nurse 05/08/16; still using Santyl and Medihoney. Still a lot of drainage per her intake nurse. Patient has no complaints pain fever chills etc. 05/15/16 switched the Hydrofera Blue last week.  Dimensions down especially in the medial right leg wound. Area on the lateral which is more substantial also looks better still requires debridement 05/22/16; we have been using Hydrofera Blue. 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 Currently he tells me that the pain is doing very well about a 1 out of 10 today. Yesterday was a little bit worse but he tells me that he was more active watering his flowers that day. Overall he feels that his symptoms are doing significantly better at this point in time. His edema continues to be controlled well with the 4-layer compression wrap and he really has not noted any odor at this point in time. He is tolerating the dressing changes when they are performed well. 06/05/16 at this point in time today patient currently shows no interval signs or symptoms of local or systemic infection. Again his pain level he rates to be a 1 out of 10 at most and overall he tells me that generally this is not giving him much trouble. In fact he even feels maybe a little bit better than last week. We have continue with the 4-layer compression wrap in which she tolerates very well at this point. He is continuing to utilize the [place]. 06/12/16 I think there has been some progression in the status of both of these wounds over today again covered in a gelatinous surface. Has been using Hydrofera Blue. We had used Iodoflex in the past I'm not sure if there was an issue other than changing to something that might progress towards closure faster 06/19/16; he did not tolerate the Flexeril last week secondary to pain and this was changed on Friday back to [facility] area he continues to have copious amounts of gelatinous surface slough which is think inhibiting the speed of healing this  area 06/26/16 patient over the last week has utilized the Santyl to try to loosen up some of the tightly adherent slough that was noted on evaluation last week. The good news is he tells me that the medial malleoli region really does not bother him the right llateral malleoli region is more tender to palpation at this point in time especially in the central/inferior location. However it does appear that the Santyl has done his job to loosen up the adherent slough at this point in time. Fortunately he has no interval signs or symptoms of infection locally or systemically no purulent discharge noted. 07/03/16 at this point in time today patient's wounds appear to be significantly improved over the right medial and lateral malleolus locations. He has much less tenderness at this point in time and the wounds appear clean her although there is still adherent slough this is sufficiently improved over what I saw last week. I still see no evidence of local infection. 07/10/16; continued gradual improvement in the right medial and lateral malleolus locations. The lateral is more substantial wound now divided into 2 by a rim of normal epithelialization. Both areas have adherent surface slough and nonviable subcutaneous tissue 07-17-16- He continues to have progress to  his right medial and lateral malleolus ulcers. He denies any complaints of pain or intolerance to compression. Both ulcers are smaller in size oriented today's measurements, both are covered with a softly adherent slough. 07/24/16; medial wound is smaller, lateral about the same although surface looks better. Still using [name], [name] ([id number]) Hydrofera Blue 07/31/16; arrives today complaining of pain in the lateral part of his foot. Nurse reports a lot more drainage. He has been using Hydrofera Blue. Switch to silver alginate today 08/03/2016 -- I was asked to see the patient was here for a nurse visit today. I understand he had a  lot of pain in his right lower extremity and was having blisters on his right foot which have not been there before. Though he started on doxycycline he does not have blisters elsewhere on his body. I do not believe this is a drug allergy. also mentioned that there was a copious purulent discharge from the wound and clinically there is no evidence of cellulitis. 08/07/16; I note that the patient came in for his nurse check on Friday apparently with blisters on his toes on the right than a lot of swelling in his forefoot. He continued on the doxycycline that I had prescribed on 12/8. A culture was done of the lateral wound that showed a combination of a few Proteus and Pseudomonas. Doxycycline might of covered the Proteus but would be unlikely to cover the Pseudomonas. He is on Coumadin. He arrives in the clinic today feeling a lot better states the pain is a lot better but nothing specific really was done other than to rewrap the foot also noted that he had arterial studies ordered in August although these were never done. It is reasonable to go ahead and reorder these. 08/14/16; generally arrives in a better state today in terms of the wounds he has taken cefdinir for one week. Our intake nurse reports copious amounts of drainage but the patient is complaining of much less pain. He is not had his PT and INR checked and I've asked him to do this today or tomorrow. 08/24/2016 -- patient arrives today after 10 days and said he had a stomach upset. His arterial study was done and I have reviewed this report and find it to be within normal limits. However I did not note any venous duplex studies for reflux, and Dr. Leanord Hawking may have ordered these in the past but I will leave it to him to decide if he needs these. The patient has finished his course of cefdinir. 08/28/16; patient arrives today again with copious amounts of thick really green drainage for our intake nurse. He states he has a very tender  spot at the superior part of the lateral wound. Wounds are larger 09/04/16; no real change in the condition of this patient's wound still copious amounts of surface slough. Started him on Iodoflex last week he is completing another course of Cefdinir or which I think was done empirically. His arterial study showed ABIs were 1.1 on the right 1.5 on the left. He did have a slightly reduced ABI in the right the left one was not obtained. Had calcification of the right posterior tibial artery. The interpretation was no segmental stenosis. His waveforms were triphasic. His reflux studies are later this month. Depending on this I'll send him for a vascular consultation, he may need to see plastic surgery as I believe he is had plastic surgery on this foot in the past. He had an injury to  the foot in the 1980s. 1/16 /18 right lateral greater than right medial ankle wounds on the right in the setting of previous skin grafting. 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 He does not have arterial issues. Each week he comes in with the same adherent surface slough although there was less of this today 09/18/16; right lateral greater than right medial lower extremity wounds in the setting of previous skin grafting and trauma. He has least to vein laser ablation scheduled for February 2 for venous reflux. He does not have significant arterial disease. Problem has been very difficult to handle surface slough/necrotic tissue. Recently using Iodoflex for this with some, albeit slow improvement 09/25/16; right lateral greater than right medial lower extremity venous wounds in the setting of previous skin grafting. He is going for ablation surgery on February 2 after this he'll come back here for rewrap. He has been using Iodoflex as the primary dressing. 10/02/16; right lateral greater than right medial lower extremity wounds in the  setting of previous skin grafting. He had his ablation surgery last week, I don't have a report. He tolerated this well. Came in with a thigh- high Unna boots on Friday. We have been using Iodoflex as the primary dressing. His measurements are improving 10/09/16; continues to make nice aggressive in terms of the wounds on his lateral and medial right ankle in the setting of previous skin grafting. Yesterday he noticed drainage at one of his surgical sites from his [name], CYR. ([id number]) venous ablation on the right calf. He took off the bandage over this area felt a "popping" sensation and a reddish-brown drainage. He is not complaining of any pain 10/16/16; he continues to make nice progression in terms of the wounds on the lateral and medial malleolus. Both smaller using Iodoflex. He had a surgical area in his posterior mid calf we have been using iodoform. All the wounds are down and dimensions 10/23/16; the patient arrives today with no complaints. He states the Iodoflex is a bit uncomfortable. He is not systemically unwell. We have been using Iodoflex to the lateral right ankle and the medial and Aquacel Ag to the reflux surgical wound on the posterior right calf. All of these wounds are doing well 10/30/16; patient states he has no pain no systemic symptoms. I changed him to [facility] last week. Although the wounds are doing well 11/06/16; patient reports no pain or systemic symptoms. We continue with Hydrofera Blue. Both wound areas on the medial and lateral ankle appear to be doing well with improvement and dimensions and improvement in the wound bed. 11/13/16; patient's dimensions continued to improve. We continue with Hydrofera Blue on the medial and lateral side. Appear to be doing well with healthy granulation and advancing epithelialization 11/20/16; patient's dimensions improving laterally by about half a centimeter in length. Otherwise no change on the medial side. Using  [facility] 12/04/16; no major change in patient's wound dimensions. Intake nurse reports more drainage. 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 He is voicing no complaints or concerns. He has been tolerating his twice weekly compression therapy changes 12/11/16 Patient complains of pain and increased drainage.. wants hydrofera blue 12/18/16 improvement. Sorbact Objective Constitutional Patient is hypertensive.. Pulse regular and within target range for patient.Marland Kitchen Respirations regular, non-labored and within  target range.. Temperature is normal and within the target range for the patient.. Patient's appearance is neat and clean. Appears in no acute distress. Well nourished and well developed.. Vitals Time Taken: 8:05 AM, Height: 76 in, Weight: 238 lbs, BMI: 29, Temperature: 98.2 F, Pulse: 74 bpm, Respiratory Rate: 18 breaths/min, Blood Pressure: 151/68 mmHg. General Notes: wound exam; much improved in terms of wound condition and dimensions. Using an open curette the lateral right wound is debrided again of adherent necrotic material. Hemostasis with direct pressure. surface of wound post debridement looks healthy doing well. -lateral ankle wound does not required debridement. healthy base and no debridement Integumentary (Hair, Skin) Wound #1 status is Open. Original cause of wound was Gradually Appeared. The wound is located on the Right,Lateral Malleolus. The wound measures 5.5cm length x 6cm width x 0.2cm depth; 25.918cm^2 area [name], Halsey C. ([id number]) and 5.184cm^3 volume. The wound is limited to skin breakdown. There is no tunneling or undermining noted. There is a large amount of serosanguineous drainage noted. The wound margin is distinct with the outline attached to the wound base. There is medium (34-66%) red granulation within the wound bed. There is a medium  (34-66%) amount of necrotic tissue within the wound bed including Adherent Slough. The periwound skin appearance exhibited: Maceration. Periwound temperature was noted as No Abnormality. Wound #2 status is Open. Original cause of wound was Gradually Appeared. The wound is located on the Right,Medial Malleolus. The wound measures 2.2cm length x 1.5cm width x 0.2cm depth; 2.592cm^2 area and 0.518cm^3 volume. The wound is limited to skin breakdown. There is no tunneling or undermining noted. There is a large amount of serosanguineous drainage noted. The wound margin is distinct with the outline attached to the wound base. There is medium (34-66%) red granulation within the wound bed. There is a medium (34-66%) amount of necrotic tissue within the wound bed including Adherent Slough. The periwound skin appearance exhibited: Maceration. Periwound temperature was noted as No Abnormality. The periwound has tenderness on palpation. 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 There was a Skin/Subcutaneous Tissue Debridement ([id number]) debridement with total area of 33 sq cm performed by [name], MD. with the following instrument(s): scoop to remove Viable and Non-Viable tissue/material including Exudate, Fibrin/Slough, and Subcutaneous after achieving pain control  using Lidocaine 4% Topical Solution. A time out was conducted at 08:20, prior to the start of the procedure. A Minimum amount of bleeding was controlled with Pressure. The procedure was tolerated well with a pain level of 0 throughout and a pain level of 0 following the procedure. Post Debridement Measurements: 5.5cm length x 6cm width x 0.2cm depth; 5.184cm^3 volume. Character of Wound/Ulcer Post Debridement requires further debridement. Severity of Tissue Post Debridement is: Fat layer exposed. Post procedure Diagnosis Wound #1: Same as Pre-Procedure DOMIQUE, CLAPPER. ([id number]) Plan Wound Cleansing: Wound #1 Right,Lateral Malleolus: Cleanse wound with mild soap and water May shower with protection. No tub bath. Wound #2 Right,Medial Malleolus:  Cleanse wound with mild soap and water May shower with protection. No tub bath. Anesthetic: Wound #1 Right,Lateral Malleolus: Topical Lidocaine 4% cream applied to wound bed prior to debridement Wound #2 Right,Medial Malleolus: Topical Lidocaine 4% cream applied to wound bed prior to debridement Skin Barriers/Peri-Wound Care: Wound #1 Right,Lateral Malleolus: Barrier cream - zinc oxide Triamcinolone Acetonide Ointment Primary Wound Dressing: Wound #1 Right,Lateral Malleolus: Hydrogel Other: - sorbact Wound #2 Right,Medial Malleolus: Hydrogel Other: - sorbact Secondary Dressing: Wound #1 Right,Lateral Malleolus: ABD pad XtraSorb Drawtex Wound #2 Right,Medial Malleolus: ABD pad Drawtex Dressing Change Frequency: Wound #1 Right,Lateral Malleolus: Change dressing every week Other: - nurse visit Friday Wound #2 Right,Medial Malleolus: Change dressing every week Other: - nurse visit Friday Follow-up Appointments: Wound #1 Right,Lateral Malleolus: PRATHAM, CASSATT. ([id number]) Return Appointment in 1 week. Other: - nurse visit Friday Wound #2 Right,Medial Malleolus: Return Appointment in 1 week. Other: - nurse visit  Friday Edema Control: Wound #1 Right,Lateral Malleolus: 3 Layer Compression System - Right Lower Extremity - UNNA TO ANCHOR Elevate legs to the level of the heart and pump ankles as often as possible Wound #2 Right,Medial Malleolus: 3 Layer Compression System - Right Lower Extremity - UNNA TO ANCHOR Elevate legs to the level of the heart and pump ankles as often as possible Additional Orders / Instructions: Wound #1 Right,Lateral Malleolus: Increase protein intake. OK to return to work with the following restrictions: Activity as tolerated Wound #2 Right,Medial Malleolus: Increase protein intake. OK to return to work with the following restrictions: Activity as tolerated 1. much better using sorbact/hydrogel Electronic Signature(s) Signed: 12/25/2016 4:03:28 PM By: Elliot Gurney RN, BSN, Kim RN, BSN Signed: 12/26/2016 7:54:35 AM By: [name] MD Previous Signature: 12/19/2016 7:39:10 AM Version By: [name] MD Entered By: Elliot Gurney RN, BSN, Kim on 12/25/2016 16:03:28 AUSTAN, NICHOLL ([id number]) -------------------------------------------------------------------------------- SuperBill Details [name] Date of Service: 12/18/2016 Patient Name: C. Patient Account Number: [id number] Medical Record Treating RN: [name] [id number] Number: Other Clinician: Date of Birth/Sex: Jun 29, 1946 (71 y.o. 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, initial T85.613A encounter Disruption of external operation  (surgical) wound, not elsewhere classified, subsequent T81.31XD encounter L97.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 of skin Facility Procedures CPT4: Description Modifier Quantity Code [id number] 11042 - DEB SUBQ TISSUE 20 SQ CM/< 25 1 ICD-10 Description Diagnosis I87.331 Chronic venous hypertension (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

## 2016-12-21 DIAGNOSIS — I87331 Chronic venous hypertension (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: Secondary | ICD-10-CM | POA: Diagnosis not present

## 2016-12-23 NOTE — Progress Notes (Signed)
[name] ([id number]) Visit Report for 12/21/2016 Arrival Information Details Patient Name: [name]. Date of Service: 12/21/2016 8:00 AM Medical Record Number: [id number] Patient Account Number: [id number] Date of Birth/Sex: [birth year] (71 y.o. Male) Treating RN: [name] Primary Care Cinde Ebert: [name] Other Clinician: Referring Safina Huard: [name] Treating General Wearing/Extender: STONE III, HOYT Weeks in Treatment: 48 Visit Information History Since Last Visit All ordered tests and consults were completed: No Patient Arrived: Ambulatory Added or deleted any medications: No Arrival Time: 08:03 Any new allergies or adverse reactions: No Accompanied By: self Had a fall or experienced change in No Transfer Assistance: None activities of daily living that may affect Patient Identification Verified: Yes risk of falls: Secondary Verification Process Yes Signs or symptoms of abuse/neglect since last No Completed: visito Patient Requires Transmission-Based No Hospitalized since last visit: No Precautions: Has Dressing in Place as Prescribed: Yes Patient Has Alerts: No Has Compression in Place as Prescribed: Yes Pain Present Now: No Electronic Signature(s) Signed: 12/21/2016 4:51:20 PM By: Alejandro Mulling Entered By: Alejandro Mulling on 12/21/2016 08:12:58 [name] ([id number]) -------------------------------------------------------------------------------- Encounter Discharge Information Details Patient Name: [name] Date of Service: 12/21/2016 8:00 AM Medical Record Number: [id number] Patient Account Number: [id number] Date of Birth/Sex: 01/01/1946 (71 y.o. Male) Treating RN: [name] Primary Care Courtny Bennison: [name] Other Clinician: Referring Sou Nohr: [name] Treating Stephene Alegria/Extender: STONE III, HOYT Weeks in Treatment: 48 Encounter Discharge Information Items Discharge Pain Level: 0 Discharge  Condition: Stable Ambulatory Status: Ambulatory Discharge Destination: Home Private Transportation: Auto Accompanied By: self Schedule Follow-up Appointment: Yes Medication Reconciliation completed and No provided to Patient/Care Ventura Leggitt: Clinical Summary of Care: Electronic Signature(s) Signed: 12/21/2016 4:51:20 PM By: Alejandro Mulling Entered By: Alejandro Mulling on 12/21/2016 08:34:20 [name] ([id number]) -------------------------------------------------------------------------------- Patient/Caregiver Education Details Patient Name: [name] Date of Service: 12/21/2016 8:00 AM Medical Record Number: [id number] Patient Account Number: [id number] Date of Birth/Gender: Jul 05, 1946 (71 y.o. Male) Treating RN: [name] Primary Care Physician: [name] Other Clinician: Referring Physician: [name] Treating Physician/Extender: Linwood Dibbles, HOYT Weeks in Treatment: 48 Education Assessment Education Provided To: Patient Education Topics Provided Wound/Skin Impairment: Handouts: Other: do not get wrap wet Methods: Demonstration, Explain/Verbal Responses: State content correctly Electronic Signature(s) Signed: 12/21/2016 4:51:20 PM By: Alejandro Mulling Entered By: Alejandro Mulling on 12/21/2016 08:34:05 [name] ([id number]) -------------------------------------------------------------------------------- Wound Assessment Details Patient Name: [name] Date of Service: 12/21/2016 8:00 AM Medical Record Number: [id number] Patient Account Number: [id number] Date of Birth/Sex: 10-28-1945 (71 y.o. Male) Treating RN: Ashok Cordia, [name] Primary Care Kaci Freel: [name] Other Clinician: Referring Karon Cotterill: [name] Treating Lakyn Mantione/Extender: STONE III, HOYT Weeks in Treatment: 48 Wound Status Wound Number: 1 Primary Venous Leg Ulcer Etiology: Wound Location: Right Malleolus - Lateral Wound Open Wounding Event:  Gradually Appeared Status: Date Acquired: 08/11/2015 Comorbid Cataracts, Chronic Obstructive Weeks Of Treatment: 48 History: Pulmonary Disease (COPD), Clustered Wound: No Osteoarthritis Wound Measurements Length: (cm) 5.5 Width: (cm) 6 Depth: (cm) 0.2 Area: (cm) 25.918 Volume: (cm) 5.184 % Reduction in Area: -50% % Reduction in Volume: 0% Epithelialization: Small (1-33%) Tunneling: No Undermining: No Wound Description Full Thickness Without Exposed Classification: Support Structures Wound Margin: Distinct, outline attached Exudate Large Amount: Exudate Type: Serosanguineous Exudate Color: red, brown Foul Odor After Cleansing: No Slough/Fibrino Yes Wound Bed Granulation Amount: Medium (34-66%) Exposed Structure Granulation Quality: Red Fascia Exposed: No Necrotic Amount: Medium (34-66%) Fat Layer (Subcutaneous Tissue) Exposed: No Necrotic Quality: Adherent Slough Tendon Exposed: No Muscle Exposed: No Joint  Exposed: No Bone Exposed: No Limited to Skin Breakdown Periwound Skin Texture Texture Color No Abnormalities Noted: No No Abnormalities Noted: No Moisture Temperature / Pain [name], [name] ([id number]) No Abnormalities Noted: No Temperature: No Abnormality Maceration: Yes Wound Preparation Ulcer Cleansing: Rinsed/Irrigated with Saline, Other: soap and water, Topical Anesthetic Applied: None, Other: lidocaine 4%, Treatment Notes Wound #1 (Right, Lateral Malleolus) 1. Cleansed with: Clean wound with Normal Saline Cleanse wound with antibacterial soap and water 4. Dressing Applied: Hydrogel Other dressing (specify in notes) 5. Secondary Dressing Applied ABD Pad 7. Secured with Tape 3 Layer Compression System - Right Lower Extremity Notes drawtex, sorbact Electronic Signature(s) Signed: 12/21/2016 4:51:20 PM By: Alejandro Mulling Entered By: Alejandro Mulling on 12/21/2016 08:22:50 [name]  ([id number]) -------------------------------------------------------------------------------- Wound Assessment Details Patient Name: [name] Date of Service: 12/21/2016 8:00 AM Medical Record Number: [id number] Patient Account Number: [id number] Date of Birth/Sex: 1946/01/27 (71 y.o. Male) Treating RN: Ashok Cordia, [name] Primary Care Nakaila Freeze: [name] Other Clinician: Referring Devynn Scheff: [name] Treating Zyonna Vardaman/Extender: STONE III, HOYT Weeks in Treatment: 48 Wound Status Wound Number: 2 Primary Venous Leg Ulcer Etiology: Wound Location: Right Malleolus - Medial Wound Open Wounding Event: Gradually Appeared Status: Date Acquired: 01/30/2016 Comorbid Cataracts, Chronic Obstructive Weeks Of Treatment: 46 History: Pulmonary Disease (COPD), Clustered Wound: Yes Osteoarthritis Wound Measurements Length: (cm) 2.2 Width: (cm) 1.5 Depth: (cm) 0.2 Area: (cm) 2.592 Volume: (cm) 0.518 % Reduction in Area: 77.5% % Reduction in Volume: 55.2% Epithelialization: Small (1-33%) Tunneling: No Undermining: No Wound Description Full Thickness Without Exposed Classification: Support Structures Wound Margin: Distinct, outline attached Exudate Large Amount: Exudate Type: Serosanguineous Exudate Color: red, brown Foul Odor After Cleansing: No Slough/Fibrino Yes Wound Bed Granulation Amount: Medium (34-66%) Exposed Structure Granulation Quality: Red Fascia Exposed: No Necrotic Amount: Medium (34-66%) Fat Layer (Subcutaneous Tissue) Exposed: No Necrotic Quality: Adherent Slough Tendon Exposed: No Muscle Exposed: No Joint Exposed: No Bone Exposed: No Limited to Skin Breakdown Periwound Skin Texture Texture Color No Abnormalities Noted: No No Abnormalities Noted: No Moisture Temperature / Pain [name], [name] ([id number]) No Abnormalities Noted: No Temperature: No Abnormality Maceration: Yes Tenderness on Palpation: Yes Wound Preparation Ulcer  Cleansing: Rinsed/Irrigated with Saline, Other: soap and water, Topical Anesthetic Applied: None, Other: lidocaine 4%, Treatment Notes Wound #2 (Right, Medial Malleolus) 1. Cleansed with: Clean wound with Normal Saline Cleanse wound with antibacterial soap and water 4. Dressing Applied: Hydrogel Other dressing (specify in notes) 5. Secondary Dressing Applied ABD Pad 7. Secured with Tape 3 Layer Compression System - Right Lower Extremity Notes drawtex, sorbact Electronic Signature(s) Signed: 12/21/2016 4:51:20 PM By: Alejandro Mulling Entered By: Alejandro Mulling on 12/21/2016 08:23:04

## 2016-12-25 ENCOUNTER — Encounter: Payer: Medicare HMO | Attending: Internal Medicine | Admitting: Internal Medicine

## 2016-12-25 DIAGNOSIS — T85613A 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, initial encounter: Secondary | ICD-10-CM | POA: Insufficient documentation

## 2016-12-25 DIAGNOSIS — Y832 Surgical operation with anastomosis, bypass or graft as the cause of abnormal reaction of the patient, or of later complication, without mention of misadventure at the time of the procedure: Secondary | ICD-10-CM | POA: Insufficient documentation

## 2016-12-25 DIAGNOSIS — Z86718 Personal history of other venous thrombosis and embolism: Secondary | ICD-10-CM | POA: Insufficient documentation

## 2016-12-25 DIAGNOSIS — Z7901 Long term (current) use of anticoagulants: Secondary | ICD-10-CM | POA: Diagnosis not present

## 2016-12-25 DIAGNOSIS — T8131XD Disruption of external operation (surgical) wound, not elsewhere classified, subsequent encounter: Secondary | ICD-10-CM | POA: Insufficient documentation

## 2016-12-25 DIAGNOSIS — L97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 of skin: Secondary | ICD-10-CM | POA: Diagnosis not present

## 2016-12-25 DIAGNOSIS — I87331 Chronic venous hypertension (idiopathic) with ulcer and inflammation of right lower extremity: Secondary | ICD-10-CM | POA: Diagnosis not present

## 2016-12-25 DIAGNOSIS — L97212 Non-pressure chronic ulcer of right calf with fat layer exposed: Secondary | ICD-10-CM | POA: Diagnosis not present

## 2016-12-27 NOTE — Progress Notes (Signed)
[name], [name] ([id number]) Visit Report for 12/25/2016 Arrival Information Details Patient Name: [name], [name]. Date of Service: 12/25/2016 8:00 AM Medical Record Number: [id number] Patient Account Number: [id number] Date of Birth/Sex: 10/09/1945 (71 y.o. Male) Treating RN: [name] Primary Care Areil Ottey: [name] Other Clinician: Referring Ahnesty Finfrock: [name] Treating Kimi Kroft/Extender: Altamese [place] in Treatment: 49 Visit Information History Since Last Visit All ordered tests and consults were completed: No Patient Arrived: Ambulatory Added or deleted any medications: No Arrival Time: 08:00 Any new allergies or adverse reactions: No Accompanied By: self Had a fall or experienced change in No Transfer Assistance: EasyP[employer] activities of daily living that may affect Patient Lift risk of falls: Patient Identification Verified: Yes Signs or symptoms of abuse/neglect since last No Secondary Verification Process Yes visito Completed: Hospitalized since last visit: No Patient Requires Transmission- No Has Dressing in Place as Prescribed: Yes Based Precautions: Has Compression in Place as Prescribed: Yes Patient Has Alerts: No Pain Present Now: No Electronic Signature(s) Signed: 12/25/2016 4:41:02 PM By: Alejandro Mulling Entered By: Alejandro Mulling on 12/25/2016 08:03:01 [name] ([id number]) -------------------------------------------------------------------------------- Encounter Discharge Information Details Patient Name: [name] Date of Service: 12/25/2016 8:00 AM Medical Record Number: [id number] Patient Account Number: [id number] Date of Birth/Sex: Oct 29, 1945 (71 y.o. Male) Treating RN: [name] Primary Care Ashlyn Cabler: [name] Other Clinician: Referring Betania Dizon: [name] Treating Kaylena Pacifico/Extender: Altamese [place] in Treatment: 36 Encounter Discharge Information Items Discharge Pain Level:  0 Discharge Condition: Stable Ambulatory Status: Ambulatory Discharge Destination: Home Transportation: Private Auto Accompanied By: self Schedule Follow-up Appointment: Yes Medication Reconciliation completed No and provided to Patient/Care Shalaine Payson: Patient Clinical Summary of Care: Declined Electronic Signature(s) Signed: 12/25/2016 8:41:03 AM By: Gwenlyn Perking Entered By: Gwenlyn Perking on 12/25/2016 08:41:03 [name] ([id number]) -------------------------------------------------------------------------------- Lower Extremity Assessment Details Patient Name: [name] Date of Service: 12/25/2016 8:00 AM Medical Record Number: [id number] Patient Account Number: [id number] Date of Birth/Sex: 04-18-1946 (71 y.o. Male) Treating RN: Ashok Cordia, [name] Primary Care Chasiti Waddington: [name] Other Clinician: Referring Deserae Jennings: [name] Treating Zaden Sako/Extender: [name] Weeks in Treatment: 49 Edema Assessment Assessed: [Left: No] [Right: No] E[Left: dema] [Right: :] Calf Left: Right: Point of Measurement: 40 cm From Medial Instep cm 34.2 cm Ankle Left: Right: Point of Measurement: 12 cm From Medial Instep cm 22.1 cm Vascular Assessment Pulses: Dorsalis Pedis Palpable: [Right:Yes] Posterior Tibial Extremity colors, hair growth, and conditions: Extremity Color: [Right:Normal] Temperature of Extremity: [Right:Warm] Capillary Refill: [Right:< 3 seconds] Electronic Signature(s) Signed: 12/25/2016 4:41:02 PM By: Alejandro Mulling Entered By: Alejandro Mulling on 12/25/2016 08:13:52 [name] ([id number]) -------------------------------------------------------------------------------- Multi Wound Chart Details Patient Name: [name] Date of Service: 12/25/2016 8:00 AM Medical Record Number: [id number] Patient Account Number: [id number] Date of Birth/Sex: [birth year] (71 y.o. Male) Treating RN: Ashok Cordia, [name] Primary Care Jodell Weitman:  [name] Other Clinician: Referring Satya Buttram: [name] Treating Destiny Hagin/Extender: [name] Weeks in Treatment: 49 Vital Signs Height(in): 76 Pulse(bpm): 67 Weight(lbs): 238 Blood Pressure 156/67 (mmHg): Body Mass Index(BMI): 29 Temperature(F): 97.7 Respiratory Rate 18 (breaths/min): Photos: [1:No Photos] [2:No Photos] [N/A:N/A] Wound Location: [1:Right Malleolus - Lateral] [2:Right Malleolus - Medial] [N/A:N/A] Wounding Event: [1:Gradually Appeared] [2:Gradually Appeared] [N/A:N/A] Primary Etiology: [1:Venous Leg Ulcer] [2:Venous Leg Ulcer] [N/A:N/A] Comorbid History: [1:Cataracts, Chronic Obstructive Pulmonary Disease (COPD), Osteoarthritis] [2:Cataracts, Chronic Obstructive Pulmonary Disease (COPD), Osteoarthritis] [N/A:N/A] Date Acquired: [1:08/11/2015] [2:01/30/2016] [N/A:N/A] Weeks of Treatment: [1:49] [2:47] [N/A:N/A] Wound Status: [1:Open] [2:Open] [N/A:N/A] Clustered Wound: [1:No] [2:Yes] [N/A:N/A] Measurements  L x W x D 5.5x6x0.2 [2:2x1.1x0.2] [N/A:N/A] (cm) Area (cm) : [1:25.918] [2:1.728] [N/A:N/A] Volume (cm) : [1:5.184] [2:0.346] [N/A:N/A] % Reduction in Area: [1:-50.00%] [2:85.00%] [N/A:N/A] % Reduction in Volume: 0.00% [2:70.00%] [N/A:N/A] Classification: [1:Full Thickness Without Exposed Support Structures] [2:Full Thickness Without Exposed Support Structures] [N/A:N/A] Exudate Amount: [1:Large] [2:Large] [N/A:N/A] Exudate Type: [1:Serosanguineous] [2:Serosanguineous] [N/A:N/A] Exudate Color: [1:red, brown] [2:red, brown] [N/A:N/A] Wound Margin: [1:Distinct, outline attached] [2:Distinct, outline attached] [N/A:N/A] Granulation Amount: [1:None Present (0%)] [2:None Present (0%)] [N/A:N/A] Necrotic Amount: [1:Large (67-100%)] [2:Large (67-100%)] [N/A:N/A] Exposed Structures: [1:Fascia: No Fat Layer (Subcutaneous Tissue) Exposed: No] [2:Fascia: No Fat Layer (Subcutaneous Tissue) Exposed: No] [N/A:N/A] Tendon: No Tendon: No Muscle:  No Muscle: No Joint: No Joint: No Bone: No Bone: No Limited to Skin Limited to Skin Breakdown Breakdown Epithelialization: Small (1-33%) Small (1-33%) N/A Debridement: Debridement (96045- Debridement (40981- N/A 11047) 11047) Pre-procedure 08:17 08:17 N/A Verification/Time Out Taken: Pain Control: Lidocaine 4% Topical Lidocaine 4% Topical N/A Solution Solution Tissue Debrided: Fibrin/Slough, Exudates, Fibrin/Slough, Exudates, N/A Subcutaneous Subcutaneous Level: Skin/Subcutaneous Skin/Subcutaneous N/A Tissue Tissue Debridement Area (sq 33 2.2 N/A cm): Instrument: Other(scoop) Other(scoop) N/A Bleeding: Minimum Minimum N/A Hemostasis Achieved: Pressure Pressure N/A Procedural Pain: 0 0 N/A Post Procedural Pain: 0 0 N/A Debridement Treatment Procedure was tolerated Procedure was tolerated N/A Response: well well Post Debridement 5.5x6x0.2 2x1.1x0.2 N/A Measurements L x W x D (cm) Post Debridement 5.184 0.346 N/A Volume: (cm) Periwound Skin Texture: No Abnormalities Noted No Abnormalities Noted N/A Periwound Skin Maceration: Yes Maceration: Yes N/A Moisture: Periwound Skin Color: No Abnormalities Noted No Abnormalities Noted N/A Temperature: No Abnormality No Abnormality N/A Tenderness on No Yes N/A Palpation: Wound Preparation: Ulcer Cleansing: Ulcer Cleansing: N/A Rinsed/Irrigated with Rinsed/Irrigated with Saline, Other: soap and Saline, Other: soap and water water Topical Anesthetic Topical Anesthetic Applied: None, Other: Applied: None, Other: lidocaine 4% lidocaine 4% Procedures Performed: Debridement Debridement N/A Treatment Notes [name], [name] ([id number]) Wound #1 (Right, Lateral Malleolus) 1. Cleansed with: Clean wound with Normal Saline Cleanse wound with antibacterial soap and water 2. Anesthetic Topical Lidocaine 4% cream to wound bed prior to debridement 4. Dressing Applied: Hydrogel Other dressing (specify in notes) 5. Secondary  Dressing Applied ABD Pad Dry Gauze 7. Secured with Tape 3 Layer Compression System - Right Lower Extremity Notes xtrasorb, unna to anchor, sorbact Wound #2 (Right, Medial Malleolus) 1. Cleansed with: Clean wound with Normal Saline Cleanse wound with antibacterial soap and water 2. Anesthetic Topical Lidocaine 4% cream to wound bed prior to debridement 4. Dressing Applied: Hydrogel Other dressing (specify in notes) 5. Secondary Dressing Applied ABD Pad Dry Gauze 7. Secured with Tape 3 Layer Compression System - Right Lower Extremity Notes xtrasorb, unna to anchor, Building surveyor) Signed: 12/26/2016 7:53:49 AM By: [name] MD Entered By: [name] on 12/25/2016 08:22:23 [name] ([id number]) -------------------------------------------------------------------------------- Multi-Disciplinary Care Plan Details Patient Name: [name] Date of Service: 12/25/2016 8:00 AM Medical Record Number: [id number] Patient Account Number: [id number] Date of Birth/Sex: [birth year] (71 y.o. Male) Treating RN: [name] Primary Care Vihana Kydd: [name] Other Clinician: Referring Jade Burright: [name] Treating Jonny Longino/Extender: [name] Weeks in Treatment: 71 Active Inactive ` Venous Leg Ulcer Nursing Diagnoses: Knowledge deficit related to disease process and management Goals: Patient will maintain optimal edema control Date Initiated: 04/03/2016 Target Resolution Date: 11/24/2016 Goal Status: Active Interventions: Compression as ordered Treatment Activities: Therapeutic compression applied : 04/03/2016 Notes: ` Wound/Skin Impairment Nursing Diagnoses: Impaired tissue integrity Goals: Ulcer/skin breakdown will heal within  14 weeks Date Initiated: 01/17/2016 Target Resolution Date: 11/24/2016 Goal Status: Active Interventions: Assess ulceration(s) every visit Notes: Electronic Signature(s) Signed: 12/25/2016 4:41:02  PM By: Aurelio Jew, [name] ([id number]) Entered By: Alejandro Mulling on 12/25/2016 08:14:01 [name] ([id number]) -------------------------------------------------------------------------------- Pain Assessment Details Patient Name: [name] Date of Service: 12/25/2016 8:00 AM Medical Record Number: [id number] Patient Account Number: [id number] Date of Birth/Sex: [birth year] (71 y.o. Male) Treating RN: [name] Primary Care Greely Atiyeh: [name] Other Clinician: Referring Kevis Qu: [name] Treating Lakrista Scaduto/Extender: [name] Weeks in Treatment: 49 Active Problems Location of Pain Severity and Description of Pain Patient Has Paino No Site Locations With Dressing Change: No Pain Management and Medication Current Pain Management: Electronic Signature(s) Signed: 12/25/2016 4:41:02 PM By: Alejandro Mulling Entered By: Alejandro Mulling on 12/25/2016 08:03:08 [name] ([id number]) -------------------------------------------------------------------------------- Patient/Caregiver Education Details [name] Date of Service: 12/25/2016 8:00 AM Patient Name: C. Patient Account Number: [id number] Medical Record Treating RN: [name] [id number] Number: Other Clinician: Date of Birth/Gender: 16-Jan-1946 (71 y.o. Male) Treating [name], [name] Primary Care Physician: [name] Physician/Extender: G Referring Physician: [name] in Treatment: 83 Education Assessment Education Provided To: Patient Education Topics Provided Wound/Skin Impairment: Handouts: Other: change dressing as ordered Methods: Demonstration, Explain/Verbal Responses: State content correctly Electronic Signature(s) Signed: 12/25/2016 4:41:02 PM By: Alejandro Mulling Entered By: Alejandro Mulling on 12/25/2016 08:21:30 [name]  ([id number]) -------------------------------------------------------------------------------- Wound Assessment Details Patient Name: [name] Date of Service: 12/25/2016 8:00 AM Medical Record Number: [id number] Patient Account Number: [id number] Date of Birth/Sex: March 25, 1946 (72 y.o. Male) Treating RN: Ashok Cordia, [name] Primary Care Charley Miske: [name] Other Clinician: Referring Pegge Cumberledge: [name] Treating Zaryan Yakubov/Extender: [name] Weeks in Treatment: 49 Wound Status Wound Number: 1 Primary Venous Leg Ulcer Etiology: Wound Location: Right Malleolus - Lateral Wound Open Wounding Event: Gradually Appeared Status: Date Acquired: 08/11/2015 Comorbid Cataracts, Chronic Obstructive Weeks Of Treatment: 49 History: Pulmonary Disease (COPD), Clustered Wound: No Osteoarthritis Photos Photo Uploaded By: Alejandro Mulling on 12/25/2016 16:36:12 Wound Measurements Length: (cm) 5.5 Width: (cm) 6 Depth: (cm) 0.2 Area: (cm) 25.918 Volume: (cm) 5.184 % Reduction in Area: -50% % Reduction in Volume: 0% Epithelialization: Small (1-33%) Tunneling: No Undermining: No Wound Description Full Thickness Without Exposed Foul Odor Afte Classification: Support Structures Slough/Fibrino Wound Margin: Distinct, outline attached Exudate Large Amount: Exudate Type: Serosanguineous Exudate Color: red, brown r Cleansing: No Yes Wound Bed Granulation Amount: None Present (0%) Exposed Structure Necrotic Amount: Large (67-100%) Fascia Exposed: No [place], Cassiel C. ([id number]) Necrotic Quality: Adherent Slough Fat Layer (Subcutaneous Tissue) Exposed: No Tendon Exposed: No Muscle Exposed: No Joint Exposed: No Bone Exposed: No Limited to Skin Breakdown Periwound Skin Texture Texture Color No Abnormalities Noted: No No Abnormalities Noted: No Moisture Temperature / Pain No Abnormalities Noted: No Temperature: No Abnormality Maceration: Yes Wound  Preparation Ulcer Cleansing: Rinsed/Irrigated with Saline, Other: soap and water, Topical Anesthetic Applied: None, Other: lidocaine 4%, Treatment Notes Wound #1 (Right, Lateral Malleolus) 1. Cleansed with: Clean wound with Normal Saline Cleanse wound with antibacterial soap and water 2. Anesthetic Topical Lidocaine 4% cream to wound bed prior to debridement 4. Dressing Applied: Hydrogel Other dressing (specify in notes) 5. Secondary Dressing Applied ABD Pad Dry Gauze 7. Secured with Tape 3 Layer Compression System - Right Lower Extremity Notes xtrasorb, unna to anchor, Education officer, museum Signature(s) Signed: 12/25/2016 4:41:02 PM By: Alejandro Mulling Entered By: Alejandro Mulling on 12/25/2016 08:11:31 [name] ([id number]) -------------------------------------------------------------------------------- Wound Assessment Details Patient Name:  [name], [name] Date of Service: 12/25/2016 8:00 AM Medical Record Number: [id number] Patient Account Number: [id number] Date of Birth/Sex: 1946-02-02 15(70 y.o. Male) Treating RN: Ashok CordiaPinkerton, [name] Primary Care Zamari Bonsall: Darreld McleanMILES, LINDA Other Clinician: Referring Marque Bango: Darreld McleanMILES, LINDA Treating Odell Choung/Extender: Maxwell CaulOBSON, [name] G Weeks in Treatment: 49 Wound Status Wound Number: 2 Primary Venous Leg Ulcer Etiology: Wound Location: Right Malleolus - Medial Wound Open Wounding Event: Gradually Appeared Status: Date Acquired: 01/30/2016 Comorbid Cataracts, Chronic Obstructive Weeks Of Treatment: 47 History: Pulmonary Disease (COPD), Clustered Wound: Yes Osteoarthritis Photos Photo Uploaded By: Alejandro MullingPinkerton, Debra on 12/25/2016 16:36:51 Wound Measurements Length: (cm) 2 Width: (cm) 1.1 Depth: (cm) 0.2 Area: (cm) 1.728 Volume: (cm) 0.346 % Reduction in Area: 85% % Reduction in Volume: 70% Epithelialization: Small (1-33%) Tunneling: No Undermining: No Wound Description Full Thickness Without Exposed Foul Odor  Afte Classification: Support Structures Slough/Fibrino Wound Margin: Distinct, outline attached Exudate Large Amount: Exudate Type: Serosanguineous Exudate Color: red, brown r Cleansing: No Yes Wound Bed Granulation Amount: None Present (0%) Exposed Structure Necrotic Amount: Large (67-100%) Fascia Exposed: No Caldron, Kharson C. ([id number]) Necrotic Quality: Adherent Slough Fat Layer (Subcutaneous Tissue) Exposed: No Tendon Exposed: No Muscle Exposed: No Joint Exposed: No Bone Exposed: No Limited to Skin Breakdown Periwound Skin Texture Texture Color No Abnormalities Noted: No No Abnormalities Noted: No Moisture Temperature / Pain No Abnormalities Noted: No Temperature: No Abnormality Maceration: Yes Tenderness on Palpation: Yes Wound Preparation Ulcer Cleansing: Rinsed/Irrigated with Saline, Other: soap and water, Topical Anesthetic Applied: None, Other: lidocaine 4%, Treatment Notes Wound #2 (Right, Medial Malleolus) 1. Cleansed with: Clean wound with Normal Saline Cleanse wound with antibacterial soap and water 2. Anesthetic Topical Lidocaine 4% cream to wound bed prior to debridement 4. Dressing Applied: Hydrogel Other dressing (specify in notes) 5. Secondary Dressing Applied ABD Pad Dry Gauze 7. Secured with Tape 3 Layer Compression System - Right Lower Extremity Notes xtrasorb, unna to anchor, Education officer, museumsorbact Electronic Signature(s) Signed: 12/25/2016 4:41:02 PM By: Alejandro MullingPinkerton, Debra Entered By: Alejandro MullingPinkerton, Debra on 12/25/2016 08:12:09 [name] StackBLACKWELL, [name] ([id number]) -------------------------------------------------------------------------------- Vitals Details Patient Name: [name] StackBLACKWELL, Brook C. Date of Service: 12/25/2016 8:00 AM Medical Record Number: [id number] Patient Account Number: [id number] Date of Birth/Sex: 1946-02-02 59(70 y.o. Male) Treating RN: Ashok CordiaPinkerton, [name] Primary Care Koby Pickup: Darreld McleanMILES, LINDA Other Clinician: Referring Amrit Cress: Darreld McleanMILES,  LINDA Treating Aidynn Krenn/Extender: Maxwell CaulOBSON, [name] G Weeks in Treatment: 49 Vital Signs Time Taken: 08:03 Temperature (F): 97.7 Height (in): 76 Pulse (bpm): 67 Weight (lbs): 238 Respiratory Rate (breaths/min): 18 Body Mass Index (BMI): 29 Blood Pressure (mmHg): 156/67 Reference Range: 80 - 120 mg / dl Electronic Signature(s) Signed: 12/25/2016 4:41:02 PM By: Alejandro MullingPinkerton, Debra Entered By: Alejandro MullingPinkerton, Debra on 12/25/2016 08:05:26

## 2016-12-27 NOTE — Progress Notes (Signed)
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 Quantity Code 08657846 11042 - DEB SUBQ TISSUE 20 SQ CM/< 1 ICD-10 Description Diagnosis 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

## 2016-12-28 DIAGNOSIS — I87331 Chronic venous hypertension (idiopathic) with ulcer and inflammation of right lower extremity: Secondary | ICD-10-CM | POA: Diagnosis not present

## 2016-12-30 NOTE — Progress Notes (Signed)
HARUN, BRUMLEY ([id number]) Visit Report for 12/28/2016 Arrival Information Details Patient Name: [name], [name]. Date of Service: 12/28/2016 8:00 AM Medical Record Number: [id number] Patient Account Number: [id number] Date of Birth/Sex: [birth year] (71 y.o. Male) Treating RN: [name] Primary Care Zohar Laing: [name] Other Clinician: Referring Makina Skow: [name] Treating Zuzanna Maroney/Extender: Rudene Re in Treatment: 45 Visit Information History Since Last Visit All ordered tests and consults were completed: No Patient Arrived: Ambulatory Added or deleted any medications: No Arrival Time: 08:08 Any new allergies or adverse reactions: No Accompanied By: self Had a fall or experienced change in No Transfer Assistance: [employer] activities of daily living that may affect Patient Lift risk of falls: Patient Identification Verified: Yes Signs or symptoms of abuse/neglect since last No Secondary Verification Process Yes visito Completed: Hospitalized since last visit: No Patient Requires Transmission- No Has Dressing in Place as Prescribed: Yes Based Precautions: Has Compression in Place as Prescribed: Yes Patient Has Alerts: No Pain Present Now: No Electronic Signature(s) Signed: 12/28/2016 4:31:51 PM By: Alejandro Mulling Entered By: Alejandro Mulling on 12/28/2016 08:12:12 [name] ([id number]) -------------------------------------------------------------------------------- Encounter Discharge Information Details Patient Name: [name] Date of Service: 12/28/2016 8:00 AM Medical Record Number: [id number] Patient Account Number: [id number] Date of Birth/Sex: September 14, 1945 (71 y.o. Male) Treating RN: [name] Primary Care Luretha Eberly: [name] Other Clinician: Referring Hesham Womac: [name] Treating Jaydee Ingman/Extender: Rudene Re in Treatment: 23 Encounter Discharge Information Items Discharge Pain Level:  0 Discharge Condition: Stable Ambulatory Status: Ambulatory Discharge Destination: Home Private Transportation: Auto Accompanied By: self Schedule Follow-up Appointment: Yes Medication Reconciliation completed and No provided to Patient/Care Sarahann Horrell: Clinical Summary of Care: Electronic Signature(s) Signed: 12/28/2016 4:31:51 PM By: Alejandro Mulling Entered By: Alejandro Mulling on 12/28/2016 08:16:15 [name] ([id number]) -------------------------------------------------------------------------------- Patient/Caregiver Education Details Patient Name: [name] Date of Service: 12/28/2016 8:00 AM Medical Record Number: [id number] Patient Account Number: [id number] Date of Birth/Gender: March 05, 1946 (71 y.o. Male) Treating RN: [name] Primary Care Physician: [name] Other Clinician: Referring Physician: [name] Treating Physician/Extender: Rudene Re in Treatment: 63 Education Assessment Education Provided To: Patient Education Topics Provided Wound/Skin Impairment: Handouts: Other: change dressing as ordered Methods: Demonstration, Explain/Verbal Responses: State content correctly Electronic Signature(s) Signed: 12/28/2016 4:31:51 PM By: Alejandro Mulling Entered By: Alejandro Mulling on 12/28/2016 08:16:04 [name] ([id number]) -------------------------------------------------------------------------------- Wound Assessment Details Patient Name: [name] Date of Service: 12/28/2016 8:00 AM Medical Record Number: [id number] Patient Account Number: [id number] Date of Birth/Sex: [birth year] (71 y.o. Male) Treating RN: Ashok Cordia, [name] Primary Care Tej Murdaugh: [name] Other Clinician: Referring Jay Kempe: [name] Treating Sairah Knobloch/Extender: Rudene Re in Treatment: 49 Wound Status Wound Number: 1 Primary Venous Leg Ulcer Etiology: Wound Location: Right Malleolus - Lateral Wound Open Wounding  Event: Gradually Appeared Status: Date Acquired: 08/11/2015 Comorbid Cataracts, Chronic Obstructive Weeks Of Treatment: 49 History: Pulmonary Disease (COPD), Clustered Wound: No Osteoarthritis Photos Photo Uploaded By: Alejandro Mulling on 12/28/2016 10:29:00 Wound Measurements Length: (cm) 5.5 Width: (cm) 6 Depth: (cm) 0.2 Area: (cm) 25.918 Volume: (cm) 5.184 % Reduction in Area: -50% % Reduction in Volume: 0% Epithelialization: Small (1-33%) Tunneling: No Undermining: No Wound Description Full Thickness Without Exposed Classification: Support Structures Wound Margin: Distinct, outline attached Exudate Large Amount: Exudate Type: Serosanguineous Exudate Color: red, brown Foul Odor After Cleansing: No Slough/Fibrino Yes Wound Bed Granulation Amount: None Present (0%) Exposed Structure Necrotic Amount: Large (67-100%) Fascia Exposed: No Mandell, Suresh C. ([id number]) Necrotic Quality: Adherent Slough Fat Layer (Subcutaneous Tissue)  Exposed: No Tendon Exposed: No Muscle Exposed: No Joint Exposed: No Bone Exposed: No Limited to Skin Breakdown Periwound Skin Texture Texture Color No Abnormalities Noted: No No Abnormalities Noted: No Moisture Temperature / Pain No Abnormalities Noted: No Temperature: No Abnormality Maceration: Yes Wound Preparation Ulcer Cleansing: Rinsed/Irrigated with Saline, Other: soap and water, Topical Anesthetic Applied: None, Other: lidocaine 4%, Treatment Notes Wound #1 (Right, Lateral Malleolus) 1. Cleansed with: Clean wound with Normal Saline Cleanse wound with antibacterial soap and water 3. Peri-wound Care: Barrier cream 4. Dressing Applied: Hydrogel Other dressing (specify in notes) 5. Secondary Dressing Applied ABD Pad 7. Secured with Tape 3 Layer Compression System - Right Lower Extremity Notes unna to anchor, xtrasorb, drawtex Electronic Signature(s) Signed: 12/28/2016 4:31:51 PM By: Alejandro MullingPinkerton, Debra Entered  By: Alejandro MullingPinkerton, Debra on 12/28/2016 08:12:53 [name], Carlo C. ([id number]) -------------------------------------------------------------------------------- Wound Assessment Details Patient Name: [name], Malaky C. Date of Service: 12/28/2016 8:00 AM Medical Record Number: [id number] Patient Account Number: [id number] Date of Birth/Sex: [birth year](70 y.o. Male) Treating RN: Ashok CordiaPinkerton, [name] Primary Care Maebell Lyvers: Darreld McleanMILES, LINDA Other Clinician: Referring Camiya Vinal: Darreld McleanMILES, LINDA Treating Chenae Brager/Extender: Rudene ReBritto, Errol Weeks in Treatment: 49 Wound Status Wound Number: 2 Primary Venous Leg Ulcer Etiology: Wound Location: Right Malleolus - Medial Wound Open Wounding Event: Gradually Appeared Status: Date Acquired: 01/30/2016 Comorbid Cataracts, Chronic Obstructive Weeks Of Treatment: 47 History: Pulmonary Disease (COPD), Clustered Wound: Yes Osteoarthritis Photos Photo Uploaded By: Alejandro MullingPinkerton, Debra on 12/28/2016 10:29:34 Wound Measurements Length: (cm) 2 Width: (cm) 1.1 Depth: (cm) 0.2 Area: (cm) 1.728 Volume: (cm) 0.346 % Reduction in Area: 85% % Reduction in Volume: 70% Epithelialization: Small (1-33%) Tunneling: No Undermining: No Wound Description Full Thickness Without Exposed Classification: Support Structures Wound Margin: Distinct, outline attached Exudate Large Amount: Exudate Type: Serosanguineous Exudate Color: red, brown Foul Odor After Cleansing: No Slough/Fibrino Yes Wound Bed Granulation Amount: None Present (0%) Exposed Structure Necrotic Amount: Large (67-100%) Fascia Exposed: No Corkern, Lorenzo C. ([id number]) Necrotic Quality: Adherent Slough Fat Layer (Subcutaneous Tissue) Exposed: No Tendon Exposed: No Muscle Exposed: No Joint Exposed: No Bone Exposed: No Limited to Skin Breakdown Periwound Skin Texture Texture Color No Abnormalities Noted: No No Abnormalities Noted: No Moisture Temperature / Pain No Abnormalities Noted:  No Temperature: No Abnormality Maceration: Yes Tenderness on Palpation: Yes Wound Preparation Ulcer Cleansing: Rinsed/Irrigated with Saline, Other: soap and water, Topical Anesthetic Applied: None, Other: lidocaine 4%, Treatment Notes Wound #2 (Right, Medial Malleolus) 1. Cleansed with: Clean wound with Normal Saline Cleanse wound with antibacterial soap and water 3. Peri-wound Care: Barrier cream 4. Dressing Applied: Hydrogel Other dressing (specify in notes) 5. Secondary Dressing Applied ABD Pad 7. Secured with Tape 3 Layer Compression System - Right Lower Extremity Notes unna to anchor, xtrasorb, drawtex Electronic Signature(s) Signed: 12/28/2016 4:31:51 PM By: Alejandro MullingPinkerton, Debra Entered By: Alejandro MullingPinkerton, Debra on 12/28/2016 08:13:12

## 2017-01-01 ENCOUNTER — Encounter: Payer: Medicare HMO | Attending: Internal Medicine | Admitting: Internal Medicine

## 2017-01-01 DIAGNOSIS — T8131XD Disruption of external operation (surgical) wound, not elsewhere classified, subsequent encounter: Secondary | ICD-10-CM | POA: Diagnosis not present

## 2017-01-01 DIAGNOSIS — I87331 Chronic venous hypertension (idiopathic) with ulcer and inflammation of right lower extremity: Secondary | ICD-10-CM | POA: Diagnosis present

## 2017-01-01 DIAGNOSIS — Z86718 Personal history of other venous thrombosis and embolism: Secondary | ICD-10-CM | POA: Diagnosis not present

## 2017-01-01 DIAGNOSIS — L97212 Non-pressure chronic ulcer of right calf with fat layer exposed: Secondary | ICD-10-CM | POA: Diagnosis not present

## 2017-01-01 DIAGNOSIS — Y832 Surgical operation with anastomosis, bypass or graft as the cause of abnormal reaction of the patient, or of later complication, without mention of misadventure at the time of the procedure: Secondary | ICD-10-CM | POA: Diagnosis not present

## 2017-01-01 DIAGNOSIS — T85613A 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, initial encounter: Secondary | ICD-10-CM | POA: Insufficient documentation

## 2017-01-01 DIAGNOSIS — Z7901 Long term (current) use of anticoagulants: Secondary | ICD-10-CM | POA: Insufficient documentation

## 2017-01-01 DIAGNOSIS — L97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 of skin: Secondary | ICD-10-CM | POA: Diagnosis not present

## 2017-01-03 NOTE — Progress Notes (Signed)
TATSUO, [name] ([id number]) Visit Report for 01/01/2017 Arrival Information Details Patient Name: [name], [name]. Date of Service: 01/01/2017 8:00 AM Medical Record Number: [id number] Patient Account Number: [id number] Date of Birth/Sex: 10-Feb-1946 (71 y.o. Male) Treating RN: [name] Primary Care Tresha Muzio: [name] Other Clinician: Referring [name] Deskins: [name] Treating Mayes Sangiovanni/Extender: [place] in Treatment: 50 Visit Information History Since Last Visit All ordered tests and consults were completed: No Patient Arrived: Ambulatory Added or deleted any medications: No Arrival Time: 08:03 Any new allergies or adverse reactions: No Accompanied By: self Had a fall or experienced change in No Transfer Assistance: None activities of daily living that may affect Patient Identification Verified: Yes risk of falls: Secondary Verification Process Yes Signs or symptoms of abuse/neglect since last No Completed: visito Patient Requires Transmission-Based No Hospitalized since last visit: No Precautions: Has Dressing in Place as Prescribed: Yes Patient Has Alerts: No Has Compression in Place as Prescribed: Yes Pain Present Now: No Electronic Signature(s) Signed: 01/01/2017 4:45:02 PM By: Alejandro Mulling Entered By: Alejandro Mulling on 01/01/2017 08:07:30 [name] ([id number]) -------------------------------------------------------------------------------- Encounter Discharge Information Details Patient Name: [name] Date of Service: 01/01/2017 8:00 AM Medical Record Number: [id number] Patient Account Number: [id number] Date of Birth/Sex: 05-21-1946 (71 y.o. Male) Treating RN: [name] Primary Care Saahil Herbster: [name] Other Clinician: Referring Ezme Duch: [name] Treating Sherriann Szuch/Extender: [name] Weeks in Treatment: 50 Encounter Discharge Information Items Discharge Pain Level: 0 Discharge  Condition: Stable Ambulatory Status: Ambulatory Discharge Destination: Home Transportation: Private Auto Accompanied By: self Schedule Follow-up Appointment: Yes Medication Reconciliation completed No and provided to Patient/Care Mariena Meares: Patient Clinical Summary of Care: Declined Electronic Signature(s) Signed: 01/01/2017 8:47:44 AM By: Gwenlyn Perking Entered By: Gwenlyn Perking on 01/01/2017 08:47:44 [name] ([id number]) -------------------------------------------------------------------------------- Lower Extremity Assessment Details Patient Name: [name] Date of Service: 01/01/2017 8:00 AM Medical Record Number: [id number] Patient Account Number: [id number] Date of Birth/Sex: 1946/02/26 (71 y.o. Male) Treating RN: Ashok Cordia, [name] Primary Care Mandel Seiden: [name] Other Clinician: Referring Gesenia Bantz: [name] Treating Johana Hopkinson/Extender: [name] Weeks in Treatment: 50 Edema Assessment Assessed: [Left: No] [Right: No] E[Left: dema] [Right: :] Calf Left: Right: Point of Measurement: 40 cm From Medial Instep cm 34.2 cm Ankle Left: Right: Point of Measurement: 12 cm From Medial Instep cm 22.1 cm Vascular Assessment Pulses: Dorsalis Pedis Palpable: [Right:Yes] Posterior Tibial Extremity colors, hair growth, and conditions: Extremity Color: [Right:Normal] Temperature of Extremity: [Right:Warm] Capillary Refill: [Right:< 3 seconds] Electronic Signature(s) Signed: 01/01/2017 4:45:02 PM By: Alejandro Mulling Entered By: Alejandro Mulling on 01/01/2017 08:20:16 [name] ([id number]) -------------------------------------------------------------------------------- Multi Wound Chart Details Patient Name: [name] Date of Service: 01/01/2017 8:00 AM Medical Record Number: [id number] Patient Account Number: [id number] Date of Birth/Sex: 07-11-1946 (71 y.o. Male) Treating RN: Ashok Cordia, [name] Primary Care Loyce Klasen: [name] Other Clinician: Referring Herchel Hopkin: [name] Treating Yitzhak Awan/Extender: [name] Weeks in Treatment: 50 Vital Signs Height(in): 76 Pulse(bpm): 75 Weight(lbs): 238 Blood Pressure 136/60 (mmHg): Body Mass Index(BMI): 29 Temperature(F): 97.8 Respiratory Rate 18 (breaths/min): Photos: [1:No Photos] [2:No Photos] [N/A:N/A] Wound Location: [1:Right Malleolus - Lateral] [2:Right Malleolus - Medial] [N/A:N/A] Wounding Event: [1:Gradually Appeared] [2:Gradually Appeared] [N/A:N/A] Primary Etiology: [1:Venous Leg Ulcer] [2:Venous Leg Ulcer] [N/A:N/A] Comorbid History: [1:Cataracts, Chronic Obstructive Pulmonary Disease (COPD), Osteoarthritis] [2:Cataracts, Chronic Obstructive Pulmonary Disease (COPD), Osteoarthritis] [N/A:N/A] Date Acquired: [1:08/11/2015] [2:01/30/2016] [N/A:N/A] Weeks of Treatment: [1:50] [2:48] [N/A:N/A] Wound Status: [1:Open] [2:Open] [N/A:N/A] Clustered Wound: [1:No] [2:Yes] [N/A:N/A] Measurements L x W  x D 6.5x5.5x0.2 [2:2x0.8x0.2] [N/A:N/A] (cm) Area (cm) : [1:28.078] [2:1.257] [N/A:N/A] Volume (cm) : [1:5.616] [2:0.251] [N/A:N/A] % Reduction in Area: [1:-62.50%] [2:89.10%] [N/A:N/A] % Reduction in Volume: -8.30% [2:78.30%] [N/A:N/A] Classification: [1:Full Thickness Without Exposed Support Structures] [2:Full Thickness Without Exposed Support Structures] [N/A:N/A] Exudate Amount: [1:Large] [2:Large] [N/A:N/A] Exudate Type: [1:Serosanguineous] [2:Serosanguineous] [N/A:N/A] Exudate Color: [1:red, brown] [2:red, brown] [N/A:N/A] Wound Margin: [1:Distinct, outline attached] [2:Distinct, outline attached] [N/A:N/A] Granulation Amount: [1:None Present (0%)] [2:Medium (34-66%)] [N/A:N/A] Necrotic Amount: [1:Large (67-100%)] [2:Medium (34-66%)] [N/A:N/A] Exposed Structures: [1:Fascia: No Fat Layer (Subcutaneous Tissue) Exposed: No] [2:Fascia: No Fat Layer (Subcutaneous Tissue) Exposed: No] [N/A:N/A] Tendon: No Tendon: No Muscle: No Muscle:  No Joint: No Joint: No Bone: No Bone: No Limited to Skin Limited to Skin Breakdown Breakdown Epithelialization: Small (1-33%) Small (1-33%) N/A Debridement: Debridement ([id number](11042- N/A N/A [id number]) Pre-procedure 08:21 N/A N/A Verification/Time Out Taken: Pain Control: Lidocaine 4% Topical N/A N/A Solution Tissue Debrided: Fibrin/Slough, Exudates, N/A N/A Subcutaneous Level: Skin/Subcutaneous N/A N/A Tissue Debridement Area (sq 35.75 N/A N/A cm): Instrument: Other(scoop) N/A N/A Bleeding: Minimum N/A N/A Hemostasis Achieved: Pressure N/A N/A Procedural Pain: 0 N/A N/A Post Procedural Pain: 0 N/A N/A Debridement Treatment Procedure was tolerated N/A N/A Response: well Post Debridement 6.5x5.5x0.2 N/A N/A Measurements L x W x D (cm) Post Debridement 5.616 N/A N/A Volume: (cm) Periwound Skin Texture: No Abnormalities Noted No Abnormalities Noted N/A Periwound Skin Maceration: Yes Maceration: Yes N/A Moisture: Periwound Skin Color: No Abnormalities Noted No Abnormalities Noted N/A Temperature: No Abnormality No Abnormality N/A Tenderness on No Yes N/A Palpation: Wound Preparation: Ulcer Cleansing: Ulcer Cleansing: N/A Rinsed/Irrigated with Rinsed/Irrigated with Saline, Other: soap and Saline, Other: soap and water water Topical Anesthetic Topical Anesthetic Applied: Other: lidocaine Applied: Other: lidocaine 4% 4% Procedures Performed: Debridement N/A N/A Treatment Notes [name] StackBLACKWELL, Mayo C. ([id number]) Electronic Signature(s) Signed: 01/02/2017 12:30:45 PM By: Baltazar Najjarobson, [name] MD Entered By: Baltazar Najjarobson, [name] on 01/01/2017 08:25:44 [name] StackBLACKWELL, [name] ([id number]) -------------------------------------------------------------------------------- Multi-Disciplinary Care Plan Details Patient Name: [name] StackBLACKWELL, Jcion C. Date of Service: 01/01/2017 8:00 AM Medical Record Number: [id number] Patient Account Number: [id number] Date of Birth/Sex: 1946/05/03 [age] y.o. Male) Treating  RN: Phillis HaggisPinkerton, [name] Primary Care Kevin Mario: Darreld McleanMILES, LINDA Other Clinician: Referring Kendrew Paci: Darreld McleanMILES, LINDA Treating Tamer Baughman/Extender: Maxwell CaulOBSON, [name] G Weeks in Treatment: 50 Active Inactive ` Venous Leg Ulcer Nursing Diagnoses: Knowledge deficit related to disease process and management Goals: Patient will maintain optimal edema control Date Initiated: 04/03/2016 Target Resolution Date: 11/24/2016 Goal Status: Active Interventions: Compression as ordered Treatment Activities: Therapeutic compression applied : 04/03/2016 Notes: ` Wound/Skin Impairment Nursing Diagnoses: Impaired tissue integrity Goals: Ulcer/skin breakdown will heal within 14 weeks Date Initiated: 01/17/2016 Target Resolution Date: 11/24/2016 Goal Status: Active Interventions: Assess ulceration(s) every visit Notes: Electronic Signature(s) Signed: 01/01/2017 4:45:02 PM By: Aurelio JewPinkerton, Debra Parlato, [name] ([id number]) Entered By: Alejandro MullingPinkerton, Debra on 01/01/2017 08:21:32 [name] StackBLACKWELL, Edelmiro C. ([id number]) -------------------------------------------------------------------------------- Pain Assessment Details Patient Name: [name] StackBLACKWELL, Nichalos C. Date of Service: 01/01/2017 8:00 AM Medical Record Number: [id number] Patient Account Number: [id number] Date of Birth/Sex: 1946/05/03 67(70 y.o. Male) Treating RN: Phillis HaggisPinkerton, [name] Primary Care Laryssa Hassing: Darreld McleanMILES, LINDA Other Clinician: Referring Markevion Lattin: Darreld McleanMILES, LINDA Treating Melisse Caetano/Extender: Maxwell CaulOBSON, [name] G Weeks in Treatment: 50 Active Problems Location of Pain Severity and Description of Pain Patient Has Paino No Site Locations With Dressing Change: No Pain Management and Medication Current Pain Management: Electronic Signature(s) Signed: 01/01/2017 4:45:02 PM By: Alejandro MullingPinkerton, Debra Entered By: Alejandro MullingPinkerton, Debra on 01/01/2017 08:07:37 [name] StackBLACKWELL, Ajeet C. ([id number]) -------------------------------------------------------------------------------- Patient/Caregiver  Education Details [name], Demontray Date  of Service: 01/01/2017 8:00 AM Patient Name: C. Patient Account Number: [id number] Medical Record Treating RN: [name] [id number] Number: Other Clinician: Date of Birth/Gender: 1946-04-13 (71 y.o. Male) Treating [name], [name] Primary Care Physician: [name] Physician/Extender: G Referring Physician: [name] in Treatment: 11 Education Assessment Education Provided To: Patient Education Topics Provided Wound/Skin Impairment: Handouts: Other: do not get wrap wet Methods: Demonstration, Explain/Verbal Responses: State content correctly Electronic Signature(s) Signed: 01/01/2017 4:45:02 PM By: Alejandro Mulling Entered By: Alejandro Mulling on 01/01/2017 08:44:02 [name] ([id number]) -------------------------------------------------------------------------------- Wound Assessment Details Patient Name: [name] Date of Service: 01/01/2017 8:00 AM Medical Record Number: [id number] Patient Account Number: [id number] Date of Birth/Sex: [birth year] (71 y.o. Male) Treating RN: Ashok Cordia, [name] Primary Care Javad Salva: [name] Other Clinician: Referring Shantana Christon: [name] Treating Patria Warzecha/Extender: [name] Weeks in Treatment: 50 Wound Status Wound Number: 1 Primary Venous Leg Ulcer Etiology: Wound Location: Right Malleolus - Lateral Wound Open Wounding Event: Gradually Appeared Status: Date Acquired: 08/11/2015 Comorbid Cataracts, Chronic Obstructive Weeks Of Treatment: 50 History: Pulmonary Disease (COPD), Clustered Wound: No Osteoarthritis Photos Photo Uploaded By: Alejandro Mulling on 01/01/2017 16:23:46 Wound Measurements Length: (cm) 6.5 Width: (cm) 5.5 Depth: (cm) 0.2 Area: (cm) 28.078 Volume: (cm) 5.616 % Reduction in Area: -62.5% % Reduction in Volume: -8.3% Epithelialization: Small (1-33%) Tunneling: No Undermining: No Wound Description Full Thickness Without  Exposed Foul Odor Afte Classification: Support Structures Slough/Fibrino Wound Margin: Distinct, outline attached Exudate Large Amount: Exudate Type: Serosanguineous Exudate Color: red, brown r Cleansing: No Yes Wound Bed Granulation Amount: None Present (0%) Exposed Structure Necrotic Amount: Large (67-100%) Fascia Exposed: No Defrancesco, Eytan C. ([id number]) Necrotic Quality: Adherent Slough Fat Layer (Subcutaneous Tissue) Exposed: No Tendon Exposed: No Muscle Exposed: No Joint Exposed: No Bone Exposed: No Limited to Skin Breakdown Periwound Skin Texture Texture Color No Abnormalities Noted: No No Abnormalities Noted: No Moisture Temperature / Pain No Abnormalities Noted: No Temperature: No Abnormality Maceration: Yes Wound Preparation Ulcer Cleansing: Rinsed/Irrigated with Saline, Other: soap and water, Topical Anesthetic Applied: Other: lidocaine 4%, Treatment Notes Wound #1 (Right, Lateral Malleolus) 1. Cleansed with: Clean wound with Normal Saline Cleanse wound with antibacterial soap and water 2. Anesthetic Topical Lidocaine 4% cream to wound bed prior to debridement 4. Dressing Applied: Hydrogel Other dressing (specify in notes) 5. Secondary Dressing Applied ABD Pad 7. Secured with Tape 3 Layer Compression System - Right Lower Extremity Notes unna to anchor, drawtex Electronic Signature(s) Signed: 01/01/2017 4:45:02 PM By: Alejandro Mulling Entered By: Alejandro Mulling on 01/01/2017 08:17:43 [name] ([id number]) -------------------------------------------------------------------------------- Wound Assessment Details Patient Name: [name] Date of Service: 01/01/2017 8:00 AM Medical Record Number: [id number] Patient Account Number: [id number] Date of Birth/Sex: Dec 10, 1945 (71 y.o. Male) Treating RN: Ashok Cordia, [name] Primary Care Vendetta Pittinger: [name] Other Clinician: Referring Kurk Corniel: [name] Treating Miroslava Santellan/Extender:  [name] Weeks in Treatment: 50 Wound Status Wound Number: 2 Primary Venous Leg Ulcer Etiology: Wound Location: Right Malleolus - Medial Wound Open Wounding Event: Gradually Appeared Status: Date Acquired: 01/30/2016 Comorbid Cataracts, Chronic Obstructive Weeks Of Treatment: 48 History: Pulmonary Disease (COPD), Clustered Wound: Yes Osteoarthritis Photos Photo Uploaded By: Alejandro Mulling on 01/01/2017 16:28:27 Wound Measurements Length: (cm) 2 Width: (cm) 0.8 Depth: (cm) 0.2 Area: (cm) 1.257 Volume: (cm) 0.251 % Reduction in Area: 89.1% % Reduction in Volume: 78.3% Epithelialization: Small (1-33%) Tunneling: No Undermining: No Wound Description Full Thickness Without Exposed Foul Odor Afte Classification: Support Structures Slough/Fibrino Wound Margin: Distinct, outline attached Exudate Large Amount:  Exudate Type: Serosanguineous Exudate Color: red, brown r Cleansing: No Yes Wound Bed Granulation Amount: Medium (34-66%) Exposed Structure Necrotic Amount: Medium (34-66%) Fascia Exposed: No Rome, Trigg C. ([id number]) Necrotic Quality: Adherent Slough Fat Layer (Subcutaneous Tissue) Exposed: No Tendon Exposed: No Muscle Exposed: No Joint Exposed: No Bone Exposed: No Limited to Skin Breakdown Periwound Skin Texture Texture Color No Abnormalities Noted: No No Abnormalities Noted: No Moisture Temperature / Pain No Abnormalities Noted: No Temperature: No Abnormality Maceration: Yes Tenderness on Palpation: Yes Wound Preparation Ulcer Cleansing: Rinsed/Irrigated with Saline, Other: soap and water, Topical Anesthetic Applied: Other: lidocaine 4%, Treatment Notes Wound #2 (Right, Medial Malleolus) 1. Cleansed with: Clean wound with Normal Saline Cleanse wound with antibacterial soap and water 2. Anesthetic Topical Lidocaine 4% cream to wound bed prior to debridement 4. Dressing Applied: Hydrogel Other dressing (specify in notes) 5.  Secondary Dressing Applied ABD Pad 7. Secured with Tape 3 Layer Compression System - Right Lower Extremity Notes unna to anchor, drawtex Electronic Signature(s) Signed: 01/01/2017 4:45:02 PM By: Alejandro Mulling Entered By: Alejandro Mulling on 01/01/2017 08:18:15 [name] ([id number]) -------------------------------------------------------------------------------- Vitals Details Patient Name: [name] Date of Service: 01/01/2017 8:00 AM Medical Record Number: [id number] Patient Account Number: [id number] Date of Birth/Sex: February 12, 1946 (71 y.o. Male) Treating RN: Ashok Cordia, [name] Primary Care Mirka Barbone: [name] Other Clinician: Referring Adya Wirz: [name] Treating Sabra Sessler/Extender: [name] Weeks in Treatment: 50 Vital Signs Time Taken: 08:08 Temperature (F): 97.8 Height (in): 76 Pulse (bpm): 75 Weight (lbs): 238 Respiratory Rate (breaths/min): 18 Body Mass Index (BMI): 29 Blood Pressure (mmHg): 136/60 Reference Range: 80 - 120 mg / dl Electronic Signature(s) Signed: 01/01/2017 4:45:02 PM By: Alejandro Mulling Entered By: Alejandro Mulling on 01/01/2017 08:09:05

## 2017-01-03 NOTE — Progress Notes (Signed)
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 aspect of his right foot that required skin grafting at the time to close. 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 Wound is quite extensive measuring 5. 4 x 4 by 0.3. They have been using some thermal wound product and sprayed that the obtained on the Internet for the last 5-6 monthsing much progress. This started as a small open wound that expanded. 01/24/16; the patient is been receiving Santyl changed daily by his wife. Continue debridement. Patient has no complaints 01/31/16; the patient arrives with irritation on the medial aspect of his ankle noticed by her intake nurse. The patient is noted pain in the area over the last day or 2. There are four new tiny wounds in this area. His co- pay for TheraSkin application is really high I think beyond her means 02/07/16; patient is improved C+S cultures MSSA completed Doxy. using iodoflex 02/15/16; patient arrived today with the wound and roughly the same condition. Extensive area on the right lateral foot and ankle. Using Iodoflex. He came in last week with a cluster of new wounds on the medial aspect of the same ankle. 02/22/16; once again the patient complains of a lot of drainage  coming out of this wound. We brought him back in on Friday for a dressing change has been using Iodoflex. States his pain level is better 02/29/16; still complaining of a lot of drainage even though we are putting absorbent material over the Santyl and bringing him back on Fridays for dressing changes. He is not complaining of pain. Her intake nurse notes blistering 03/07/16: pt returns today for f/u. he admits out in rain on Saturday and soaked his right leg. he did not share with his wife and he didn't notify the [facility]. he has an odor today that is c/w pseudomonas. Wound has greenish tan slough. there is no periwound erythema, induration, or fluctuance. wound has deteriorated since previous visit. denies fever, chills, body aches or malaise. no increased pain. 03/13/16: C+S showed proteus. He has not received AB'S. Switched to RTD last week. 03/27/16 patient is been using Iodoflex. Wound bed has improved and debridement is certainly easier 04/10/2016 -- he has been scheduled for a venous duplex study towards the end of the month 04/17/16; has been using silver alginate, states that the Iodoflex was hurting his wound and since that is been changed he has had no pain unfortunately the surface of the wound continues to be unhealthy with [name], [name] thick gelatinous slough and nonviable tissue. The wound will not heal like this. 04/20/2016 -- the patient was here for a nurse visit but I was asked to see the patient as the slough was quite significant and the nurse needed for clarification  regarding the ointment to be used. 04/24/16; the patient's wounds on the right medial and right lateral ankle/malleolus both look a lot better today. Less adherent slough healthier tissue. Dimensions better especially medially 05/01/16; the patient's wound surface continues to improve however he continues to require debridement switch her easier each week. Continue Santyl/Metahydrin mixture Hydrofera  Blue next week. Still drainage on the medial aspect according to the intake nurse 05/08/16; still using Santyl and Medihoney. Still a lot of drainage per her intake nurse. Patient has no complaints pain fever chills etc. 05/15/16 switched the Hydrofera Blue last week. Dimensions down especially in the medial right leg wound. Area on the lateral which is more substantial also looks better still requires debridement 05/22/16; we have been using Hydrofera Blue. 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 Currently he tells me that the pain is doing very well about a 1 out of 10 today. Yesterday was a little bit worse but he tells me that he was more active watering his flowers that day. Overall he feels that his symptoms are doing significantly better at this point in time. His edema continues to be controlled well with the 4-layer compression wrap and he really has not noted any odor at this point in time. He is tolerating the dressing changes when they are performed well. 06/05/16 at this point in time today patient currently shows no interval signs or symptoms of local or systemic infection. Again his pain level he rates to be a 1 out of 10 at most and overall he tells me that generally this is not giving him much trouble. In fact he even feels maybe a little bit better than last week. We have continue with the 4-layer compression wrap in which she tolerates very well at this point. He is continuing to utilize the [place]. 06/12/16 I think there has been some progression in the status of both of these wounds over today again covered in a gelatinous surface. Has been using Hydrofera Blue. We had used Iodoflex in the past I'm not sure if there was an issue other than changing to something that might progress  towards closure faster 06/19/16; he did not tolerate the Flexeril last week secondary to pain and this was changed on Friday back to [facility] area he continues to have copious amounts of gelatinous surface slough which is think inhibiting the speed of healing this area 06/26/16 patient over the last week has utilized the Santyl to try to loosen up some of the tightly adherent slough that was noted on evaluation last week. The good news is he tells me that the medial malleoli region really does not bother him the right llateral malleoli region is more tender to palpation at this point in time especially in the central/inferior location. However it does appear that the Santyl has done his job to loosen up the adherent slough at this point in time. Fortunately he has no interval signs or symptoms of infection locally or systemically no purulent discharge noted. 07/03/16 at this point in time today patient's wounds appear to be significantly improved over the right medial and lateral malleolus locations. He has much less tenderness at this point in time and the wounds appear clean her although there is still adherent slough this is sufficiently improved over what I saw last week. I still  see no evidence of local infection. 07/10/16; continued gradual improvement in the right medial and lateral malleolus locations. The lateral is more substantial wound now divided into 2 by a rim of normal epithelialization. 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 He denies any complaints of pain or intolerance to compression. Both ulcers are smaller in size oriented today's measurements, both are covered with a softly adherent slough. 07/24/16; medial wound is smaller, lateral about the same although surface looks better. Still using [name], [name] ([id number]) Hydrofera  Blue 07/31/16; arrives today complaining of pain in the lateral part of his foot. Nurse reports a lot more drainage. He has been using Hydrofera Blue. Switch to silver alginate today 08/03/2016 -- I was asked to see the patient was here for a nurse visit today. I understand he had a lot of pain in his right lower extremity and was having blisters on his right foot which have not been there before. Though he started on doxycycline he does not have blisters elsewhere on his body. I do not believe this is a drug allergy. also mentioned that there was a copious purulent discharge from the wound and clinically there is no evidence of cellulitis. 08/07/16; I note that the patient came in for his nurse check on Friday apparently with blisters on his toes on the right than a lot of swelling in his forefoot. He continued on the doxycycline that I had prescribed on 12/8. A culture was done of the lateral wound that showed a combination of a few Proteus and Pseudomonas. Doxycycline might of covered the Proteus but would be unlikely to cover the Pseudomonas. He is on Coumadin. He arrives in the clinic today feeling a lot better states the pain is a lot better but nothing specific really was done other than to rewrap the foot also noted that he had arterial studies ordered in August although these were never done. It is reasonable to go ahead and reorder these. 08/14/16; generally arrives in a better state today in terms of the wounds he has taken cefdinir for one week. Our intake nurse reports copious amounts of drainage but the patient is complaining of much less pain. He is not had his PT and INR checked and I've asked him to do this today or tomorrow. 08/24/2016 -- patient arrives today after 10 days and said he had a stomach upset. His arterial study was done and I have reviewed this report and find it to be within normal limits. However I did not note any venous duplex studies for reflux, and Dr. Leanord Hawking  may have ordered these in the past but I will leave it to him to decide if he needs these. The patient has finished his course of cefdinir. 08/28/16; patient arrives today again with copious amounts of thick really green drainage for our intake nurse. He states he has a very tender spot at the superior part of the lateral wound. Wounds are larger 09/04/16; no real change in the condition of this patient's wound still copious amounts of surface slough. Started him on Iodoflex last week he is completing another course of Cefdinir or which I think was done empirically. His arterial study showed ABIs were 1.1 on the right 1.5 on the left. He did have a slightly reduced ABI in the right the left one was not obtained. Had calcification of the right posterior tibial artery. The interpretation  was no segmental stenosis. His waveforms were triphasic. His reflux studies are later this month. Depending on this I'll send him for a vascular consultation, he may need to see plastic surgery as I believe he is had plastic surgery on this foot in the past. He had an injury to the foot in the 1980s. 1/16 /18 right lateral greater than right medial ankle wounds on the right in the setting of previous skin grafting. 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 He does not have arterial issues. Each week he comes in with the same adherent surface slough although there was less of this today 09/18/16; right lateral greater than right medial lower extremity wounds in the setting of previous skin grafting and trauma. He has least to vein laser ablation scheduled for February 2 for venous reflux. He does not have significant arterial disease. Problem has been very difficult to handle surface slough/necrotic tissue. Recently using Iodoflex for this with some, albeit slow improvement 09/25/16; right lateral greater than right medial lower extremity  venous wounds in the setting of previous skin grafting. He is going for ablation surgery on February 2 after this he'll come back here for rewrap. He has been using Iodoflex as the primary dressing. 10/02/16; right lateral greater than right medial lower extremity wounds in the setting of previous skin grafting. He had his ablation surgery last week, I don't have a report. He tolerated this well. Came in with a thigh- high Unna boots on Friday. We have been using Iodoflex as the primary dressing. His measurements are improving 10/09/16; continues to make nice aggressive in terms of the wounds on his lateral and medial right ankle in the setting of previous skin grafting. Yesterday he noticed drainage at one of his surgical sites from his AUSENCIO, VADEN. ([id number]) venous ablation on the right calf. He took off the bandage over this area felt a "popping" sensation and a reddish-brown drainage. He is not complaining of any pain 10/16/16; he continues to make nice progression in terms of the wounds on the lateral and medial malleolus. Both smaller using Iodoflex. He had a surgical area in his posterior mid calf we have been using iodoform. All the wounds are down and dimensions 10/23/16; the patient arrives today with no complaints. He states the Iodoflex is a bit uncomfortable. He is not systemically unwell. We have been using Iodoflex to the lateral right ankle and the medial and Aquacel Ag to the reflux surgical wound on the posterior right calf. All of these wounds are doing well 10/30/16; patient states he has no pain no systemic symptoms. I changed him to [facility] last week. Although the wounds are doing well 11/06/16; patient reports no pain or systemic symptoms. We continue with Hydrofera Blue. Both wound areas on the medial and lateral ankle appear to be doing well with improvement and dimensions and improvement in the wound bed. 11/13/16; patient's dimensions continued to improve. We  continue with Hydrofera Blue on the medial and lateral side. Appear to be doing well with healthy granulation and advancing epithelialization 11/20/16; patient's dimensions improving laterally by about half a centimeter in length. Otherwise no change on the medial side. Using [facility] 12/04/16; no major change in patient's wound dimensions. Intake nurse reports more drainage. The patient states no pain, no systemic symptoms including fever or chills 11/27/16- patient is here for follow-up evaluation of his  bimalleolar ulcers. He is voicing no complaints or concerns. He has been tolerating his twice weekly compression therapy changes 12/11/16 Patient complains of pain and increased drainage.. wants hydrofera blue 12/18/16 improvement. Sorbact 12/25/16; medial wound is smaller, lateral measures the same. Still on sorbact 01/01/17; medial wound continues to be smaller, lateral measures about the same however there is clearly advancing epithelialization here as well in fact I think the wound will ultimately divided into 2 open areas Objective Constitutional Sitting or standing Blood Pressure is within target range for patient.. Pulse regular and within target range for patient.Marland Kitchen Respirations regular, non-labored and within target range.. Temperature is normal and within the target range for the patient.. Vitals Time Taken: 8:08 AM, Height: 76 in, Weight: 238 lbs, BMI: 29, Temperature: 97.8 F, Pulse: 75 bpm, Respiratory Rate: 18 breaths/min, Blood Pressure: 136/60 mmHg. General Notes: Wound exam; continued smaller wound medially on the right ankle. No debridement required here. The left continues to be smaller at the top and in the mid section where I think this is going to epithelialize across leaving to open areas of the original wound. The lower areas still substantial. All of this covered in an adherent necrotic surface which requires continued weekly debridement [name], [name]  ([id number]) Integumentary (Hair, Skin) Wound #1 status is Open. Original cause of wound was Gradually Appeared. The wound is located on the Right,Lateral Malleolus. The wound measures 6.5cm length x 5.5cm width x 0.2cm depth; 28.078cm^2 area and 5.616cm^3 volume. The wound is limited to skin breakdown. There is no tunneling or undermining noted. There is a large amount of serosanguineous drainage noted. The wound margin is distinct with the outline attached to the wound base. There is no granulation within the wound bed. There is a large (67- 100%) amount of necrotic tissue within the wound bed including Adherent Slough. The periwound skin appearance exhibited: Maceration. Periwound temperature was noted as No Abnormality. Wound #2 status is Open. Original cause of wound was Gradually Appeared. The wound is located on the Right,Medial Malleolus. The wound measures 2cm length x 0.8cm width x 0.2cm depth; 1.257cm^2 area and 0.251cm^3 volume. The wound is limited to skin breakdown. There is no tunneling or undermining noted. There is a large amount of serosanguineous drainage noted. The wound margin is distinct with the outline attached to the wound base. There is medium (34-66%) granulation within the wound bed. There is a medium (34-66%) amount of necrotic tissue within the wound bed including Adherent Slough. The periwound skin appearance exhibited: Maceration. Periwound temperature was noted as No Abnormality. The periwound has tenderness on palpation. 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 There was a Skin/Subcutaneous Tissue Debridement ([id number]) debridement with total area of 35.75 sq cm performed by [name], MD. with the following instrument(s): scoop to remove Viable and Non-Viable tissue/material including Exudate, Fibrin/Slough, and Subcutaneous after achieving pain control using Lidocaine 4% Topical Solution. A time out was conducted at 08:21, prior to the start  of the procedure. A Minimum amount of bleeding was controlled with Pressure. The procedure was tolerated well with a pain level of 0 throughout and a pain level of 0 following the procedure. Post Debridement Measurements: 6.5cm length x 5.5cm width x 0.2cm depth; 5.616cm^3 volume. [name], [name] ([id number]) Character of Wound/Ulcer Post Debridement requires further debridement. Severity of Tissue Post Debridement is: Fat layer exposed. Post procedure Diagnosis Wound #1: Same as Pre-Procedure Plan Wound Cleansing: Wound #1 Right,Lateral Malleolus: Cleanse wound with mild soap and water May shower with protection. No tub bath. Wound #2 Right,Medial Malleolus: Cleanse wound with mild soap and water May shower with protection. No tub bath. Anesthetic: Wound #1 Right,Lateral Malleolus: Topical Lidocaine 4% cream applied to wound bed prior to debridement Wound #2 Right,Medial Malleolus: Topical Lidocaine 4% cream applied to wound bed prior to debridement Skin Barriers/Peri-Wound Care: Wound #1 Right,Lateral Malleolus: Barrier cream - zinc oxide Triamcinolone Acetonide Ointment Primary Wound Dressing: Wound #1 Right,Lateral Malleolus: Hydrogel Other: - sorbact Wound #2 Right,Medial Malleolus: Hydrogel Other: - sorbact Secondary Dressing: Wound #1  Right,Lateral Malleolus: ABD pad XtraSorb Drawtex Wound #2 Right,Medial Malleolus: ABD pad Drawtex Dressing Change Frequency: Wound #1 Right,Lateral Malleolus: Change dressing every week Other: - nurse visit Friday Wound #2 Right,Medial Malleolus: Change dressing every week [name], [name] ([id number]) Other: - nurse visit Friday Follow-up Appointments: Wound #1 Right,Lateral Malleolus: Return Appointment in 1 week. Other: - nurse visit Friday Wound #2 Right,Medial Malleolus: Return Appointment in 1 week. Other: - nurse visit Friday Edema Control: Wound #1 Right,Lateral Malleolus: 3 Layer Compression System - Right Lower Extremity - UNNA TO ANCHOR Elevate legs to the level of the heart and pump ankles as often as possible Wound #2 Right,Medial Malleolus: 3 Layer Compression System - Right Lower Extremity - UNNA TO ANCHOR Elevate legs to the level of the heart and pump ankles as often as possible Additional Orders / Instructions: Wound #1 Right,Lateral Malleolus: Increase protein intake. OK to return to work with the following restrictions: Activity as tolerated Wound #2 Right,Medial Malleolus: Increase protein intake. OK to return to work with the following restrictions: Activity as tolerated #1 no change to sorbact/hydrogel/Drawtex/ABDs/3 with her compression #2 or making gradual progression. Measurably on the medial aspect. The lateral wound is not an easy wound to measure as it is diagonal irregular. Nevertheless I think this is also improving Electronic Signature(s) Signed: 01/02/2017 12:30:45 PM By: [name] MD Entered By: [name] on 01/01/2017 08:28:56 [name] ([id number]) -------------------------------------------------------------------------------- SuperBill Details [name] Date of Service: 01/01/2017 Patient Name: C. Patient Account Number: [id number] Medical Record Treating RN: [name] [id number] Number: Other Clinician: Date of Birth/Sex: 1946/06/27 (71 y.o. 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, initial T85.613A encounter Disruption of external operation (surgical) wound, not elsewhere classified, subsequent T81.31XD encounter L97.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 of skin Facility Procedures CPT4: Description Modifier Quantity Code [id number] 11042 - DEB SUBQ TISSUE 20 SQ CM/< 1 ICD-10 Description Diagnosis L97.212 Non-pressure chronic ulcer of right calf with fat layer exposed I87.331 Chronic venous hypertension (idiopathic) with ulcer and  inflammation of right lower  extremity CPT4: [id number] 11045 - DEB SUBQ TISS EA ADDL 20CM 1 ICD-10 Description Diagnosis I87.331 Chronic venous hypertension (idiopathic) with ulcer and inflammation of right lower extremity Physician Procedures CPT4: Description Modifier Quantity Code [id number] 11042 - WC PHYS SUBQ TISS 20 SQ CM 1 ICD-10 Description Diagnosis VIHAN, SANTAGATA ([id number]) Electronic Signature(s) Signed: 01/02/2017 12:30:45 PM By: [name] MD Entered By: [name] on 01/01/2017 21:30:86

## 2017-01-04 DIAGNOSIS — I87331 Chronic venous hypertension (idiopathic) with ulcer and inflammation of right lower extremity: Secondary | ICD-10-CM | POA: Diagnosis not present

## 2017-01-06 NOTE — Progress Notes (Signed)
ROSARIO, KUSHNER ([id number]) Visit Report for 01/04/2017 Arrival Information Details Patient Name: [name], [name]. Date of Service: 01/04/2017 8:00 AM Medical Record Number: [id number] Patient Account Number: [id number] Date of Birth/Sex: Oct 04, 1945 (71 y.o. Male) Treating RN: [name] Primary Care Christmas Faraci: [name] Other Clinician: Referring Cathryne Mancebo: [name] Treating Porchia Sinkler/Extender: Rudene Re in Treatment: 50 Visit Information History Since Last Visit All ordered tests and consults were completed: No Patient Arrived: Ambulatory Added or deleted any medications: No Arrival Time: 08:04 Any new allergies or adverse reactions: No Accompanied By: self Had a fall or experienced change in No Transfer Assistance: None activities of daily living that may affect Patient Identification Verified: Yes risk of falls: Secondary Verification Process Yes Signs or symptoms of abuse/neglect since last No Completed: visito Patient Requires Transmission-Based No Hospitalized since last visit: No Precautions: Has Dressing in Place as Prescribed: Yes Patient Has Alerts: No Has Compression in Place as Prescribed: Yes Pain Present Now: No Electronic Signature(s) Signed: 01/04/2017 4:07:26 PM By: Alejandro Mulling Entered By: Alejandro Mulling on 01/04/2017 08:36:21 [name] ([id number]) -------------------------------------------------------------------------------- Encounter Discharge Information Details Patient Name: [name] Date of Service: 01/04/2017 8:00 AM Medical Record Number: [id number] Patient Account Number: [id number] Date of Birth/Sex: May 25, 1946 (71 y.o. Male) Treating RN: [name] Primary Care Luxe Cuadros: [name] Other Clinician: Referring Solace Manwarren: [name] Treating Jarmon Javid/Extender: Rudene Re in Treatment: 66 Encounter Discharge Information Items Discharge Pain Level: 0 Discharge Condition:  Stable Ambulatory Status: Ambulatory Discharge Destination: Home Private Transportation: Auto Accompanied By: self Schedule Follow-up Appointment: Yes Medication Reconciliation completed and No provided to Patient/Care Glorianna Gott: Clinical Summary of Care: Electronic Signature(s) Signed: 01/04/2017 4:07:26 PM By: Alejandro Mulling Entered By: Alejandro Mulling on 01/04/2017 08:39:16 [name] ([id number]) -------------------------------------------------------------------------------- Patient/Caregiver Education Details Patient Name: [name] Date of Service: 01/04/2017 8:00 AM Medical Record Number: [id number] Patient Account Number: [id number] Date of Birth/Gender: 1946-01-01 (71 y.o. Male) Treating RN: [name] Primary Care Physician: [name] Other Clinician: Referring Physician: [name] Treating Physician/Extender: Rudene Re in Treatment: 75 Education Assessment Education Provided To: Patient Education Topics Provided Wound/Skin Impairment: Handouts: Other: change dressing as ordered Methods: Demonstration, Explain/Verbal Responses: State content correctly Electronic Signature(s) Signed: 01/04/2017 4:07:26 PM By: Alejandro Mulling Entered By: Alejandro Mulling on 01/04/2017 08:39:03 [name] ([id number]) -------------------------------------------------------------------------------- Wound Assessment Details Patient Name: [name] Date of Service: 01/04/2017 8:00 AM Medical Record Number: [id number] Patient Account Number: [id number] Date of Birth/Sex: 1945-09-20 (71 y.o. Male) Treating RN: Ashok Cordia, [name] Primary Care [name] [name]: [name] Other Clinician: Referring Perlie Stene: [name] Treating Novak Stgermaine/Extender: Rudene Re in Treatment: 50 Wound Status Wound Number: 1 Primary Venous Leg Ulcer Etiology: Wound Location: Right Malleolus - Lateral Wound Open Wounding Event: Gradually  Appeared Status: Date Acquired: 08/11/2015 Comorbid Cataracts, Chronic Obstructive Weeks Of Treatment: 50 History: Pulmonary Disease (COPD), Clustered Wound: No Osteoarthritis Photos Photo Uploaded By: Alejandro Mulling on 01/04/2017 11:21:43 Wound Measurements Length: (cm) 6.5 Width: (cm) 5.5 Depth: (cm) 0.2 Area: (cm) 28.078 Volume: (cm) 5.616 % Reduction in Area: -62.5% % Reduction in Volume: -8.3% Epithelialization: Small (1-33%) Tunneling: No Undermining: No Wound Description Full Thickness Without Exposed Classification: Support Structures Wound Margin: Distinct, outline attached Exudate Large Amount: Exudate Type: Serosanguineous Exudate Color: red, brown Foul Odor After Cleansing: No Slough/Fibrino Yes Wound Bed Granulation Amount: None Present (0%) Exposed Structure Necrotic Amount: Large (67-100%) Fascia Exposed: No [name], [name] ([id number]) Necrotic Quality: Adherent Slough Fat Layer (Subcutaneous Tissue) Exposed: No Tendon  Exposed: No Muscle Exposed: No Joint Exposed: No Bone Exposed: No Limited to Skin Breakdown Periwound Skin Texture Texture Color No Abnormalities Noted: No No Abnormalities Noted: No Moisture Temperature / Pain No Abnormalities Noted: No Temperature: No Abnormality Maceration: Yes Wound Preparation Ulcer Cleansing: Rinsed/Irrigated with Saline, Other: soap and water, Topical Anesthetic Applied: None, Other: lidocaine 4%, Treatment Notes Wound #1 (Right, Lateral Malleolus) 1. Cleansed with: Clean wound with Normal Saline Cleanse wound with antibacterial soap and water 4. Dressing Applied: Hydrogel Other dressing (specify in notes) 5. Secondary Dressing Applied ABD Pad 7. Secured with Tape 3 Layer Compression System - Right Lower Extremity Notes drawtex, unna to anchor Electronic Signature(s) Signed: 01/04/2017 4:07:26 PM By: Alejandro MullingPinkerton, Debra Entered By: Alejandro MullingPinkerton, Debra on 01/04/2017 08:36:58 [name] StackBLACKWELL,  [name] ([id number]) -------------------------------------------------------------------------------- Wound Assessment Details Patient Name: [name] StackBLACKWELL, [name] Date of Service: 01/04/2017 8:00 AM Medical Record Number: [id number] Patient Account Number: [id number] Date of Birth/Sex: Oct 08, 1945 41(70 y.o. Male) Treating RN: Ashok CordiaPinkerton, [name] Primary Care Myrene Bougher: Darreld McleanMILES, LINDA Other Clinician: Referring Andruw Battie: Darreld McleanMILES, LINDA Treating Seena Face/Extender: Rudene ReBritto, Errol Weeks in Treatment: 50 Wound Status Wound Number: 2 Primary Venous Leg Ulcer Etiology: Wound Location: Right Malleolus - Medial Wound Open Wounding Event: Gradually Appeared Status: Date Acquired: 01/30/2016 Comorbid Cataracts, Chronic Obstructive Weeks Of Treatment: 48 History: Pulmonary Disease (COPD), Clustered Wound: Yes Osteoarthritis Photos Photo Uploaded By: Alejandro MullingPinkerton, Debra on 01/04/2017 11:21:44 Wound Measurements Length: (cm) 2 Width: (cm) 0.8 Depth: (cm) 0.2 Area: (cm) 1.257 Volume: (cm) 0.251 % Reduction in Area: 89.1% % Reduction in Volume: 78.3% Epithelialization: Small (1-33%) Tunneling: No Undermining: No Wound Description Full Thickness Without Exposed Classification: Support Structures Wound Margin: Distinct, outline attached Exudate Large Amount: Exudate Type: Serosanguineous Exudate Color: red, brown Foul Odor After Cleansing: No Slough/Fibrino Yes Wound Bed Granulation Amount: Medium (34-66%) Exposed Structure Necrotic Amount: Medium (34-66%) Fascia Exposed: No [name], [name] ([id number]) Necrotic Quality: Adherent Slough Fat Layer (Subcutaneous Tissue) Exposed: No Tendon Exposed: No Muscle Exposed: No Joint Exposed: No Bone Exposed: No Limited to Skin Breakdown Periwound Skin Texture Texture Color No Abnormalities Noted: No No Abnormalities Noted: No Moisture Temperature / Pain No Abnormalities Noted: No Temperature: No Abnormality Maceration: Yes Tenderness  on Palpation: Yes Wound Preparation Ulcer Cleansing: Rinsed/Irrigated with Saline, Other: soap and water, Topical Anesthetic Applied: None, Other: lidocaine 4%, Treatment Notes Wound #2 (Right, Medial Malleolus) 1. Cleansed with: Clean wound with Normal Saline Cleanse wound with antibacterial soap and water 4. Dressing Applied: Hydrogel Other dressing (specify in notes) 5. Secondary Dressing Applied ABD Pad 7. Secured with Tape 3 Layer Compression System - Right Lower Extremity Notes drawtex, unna to anchor Electronic Signature(s) Signed: 01/04/2017 4:07:26 PM By: Alejandro MullingPinkerton, Debra Entered By: Alejandro MullingPinkerton, Debra on 01/04/2017 08:37:14

## 2017-01-08 ENCOUNTER — Encounter: Payer: Medicare HMO | Admitting: Internal Medicine

## 2017-01-08 DIAGNOSIS — I87331 Chronic venous hypertension (idiopathic) with ulcer and inflammation of right lower extremity: Secondary | ICD-10-CM | POA: Diagnosis not present

## 2017-01-09 NOTE — Progress Notes (Signed)
BRICE, KOSSMAN ([id number]) Visit Report for 01/08/2017 Arrival Information Details Patient Name: [name], [name]. Date of Service: 01/08/2017 8:00 AM Medical Record Number: [id number] Patient Account Number: [id number] Date of Birth/Sex: 1946/04/26 (71 y.o. Male) Treating RN: [name] Primary Care Jackob [place]ok[place]: [name] Other Clinician: Referring Zyier Dykema: [name] Treating Saylah Ketner/Extender: Altamese [place] in Treatment: 51 Visit Information History Since Last Visit All ordered tests and consults were completed: No Patient Arrived: Ambulatory Added or deleted any medications: No Arrival Time: 08:02 Any new allergies or adverse reactions: No Accompanied By: self Had a fall or experienced change in No Transfer Assistance: None activities of daily living that may affect Patient Identification Verified: Yes risk of falls: Secondary Verification Process Yes Signs or symptoms of abuse/neglect since last No Completed: visito Patient Requires Transmission-Based No Hospitalized since last visit: No Precautions: Has Dressing in Place as Prescribed: Yes Patient Has Alerts: No Has Compression in Place as Prescribed: Yes Pain Present Now: No Electronic Signature(s) Signed: 01/08/2017 4:34:49 PM By: Alejandro Mulling Entered By: Alejandro Mulling on 01/08/2017 08:03:42 [name] ([id number]) -------------------------------------------------------------------------------- Encounter Discharge Information Details Patient Name: [name] Date of Service: 01/08/2017 8:00 AM Medical Record Number: [id number] Patient Account Number: [id number] Date of Birth/Sex: 1946-02-12 (71 y.o. Male) Treating RN: [name] Primary Care Ustin Cruickshank: [name] Other Clinician: Referring Brittin Belnap: [name] Treating Natthew Marlatt/Extender: Altamese [place] in Treatment: 30 Encounter Discharge Information Items Discharge Pain Level: 0 Discharge  Condition: Stable Ambulatory Status: Ambulatory Discharge Destination: Home Transportation: Private Auto Accompanied By: self Schedule Follow-up Appointment: Yes Medication Reconciliation completed No and provided to Patient/Care Lashonne Shull: Patient Clinical Summary of Care: Declined Electronic Signature(s) Signed: 01/08/2017 8:47:20 AM By: Gwenlyn Perking Entered By: Gwenlyn Perking on 01/08/2017 08:47:20 [name] ([id number]) -------------------------------------------------------------------------------- Lower Extremity Assessment Details Patient Name: [name] Date of Service: 01/08/2017 8:00 AM Medical Record Number: [id number] Patient Account Number: [id number] Date of Birth/Sex: 1946/04/03 (71 y.o. Male) Treating RN: Ashok Cordia, [name] Primary Care Burch [place]uk: [name] Other Clinician: Referring Terriah Reggio: [name] Treating Chrysten Woulfe/Extender: [name] Weeks in Treatment: 51 Edema Assessment Assessed: [Left: No] [Right: No] E[Left: dema] [Right: :] Calf Left: Right: Point of Measurement: 40 cm From Medial Instep cm 34.2 cm Ankle Left: Right: Point of Measurement: 12 cm From Medial Instep cm 22.1 cm Vascular Assessment Pulses: Dorsalis Pedis Palpable: [Right:Yes] Posterior Tibial Extremity colors, hair growth, and conditions: Extremity Color: [Right:Normal] Temperature of Extremity: [Right:Warm] Capillary Refill: [Right:< 3 seconds] Electronic Signature(s) Signed: 01/08/2017 4:34:49 PM By: Alejandro Mulling Entered By: Alejandro Mulling on 01/08/2017 08:16:37 [name] ([id number]) -------------------------------------------------------------------------------- Multi Wound Chart Details Patient Name: [name] Date of Service: 01/08/2017 8:00 AM Medical Record Number: [id number] Patient Account Number: [id number] Date of Birth/Sex: [birth year] (70 y.o. Male) Treating RN: Ashok Cordia, [name] Primary Care Thomasena Vandenheuvel: [name] Other Clinician: Referring Talayla [name]: [name] Treating Kylene Zamarron/Extender: [name] Weeks in Treatment: 51 Vital Signs Height(in): 76 Pulse(bpm): 72 Weight(lbs): 238 Blood Pressure 148/67 (mmHg): Body Mass Index(BMI): 29 Temperature(F): Respiratory Rate 18 (breaths/min): Photos: [1:No Photos] [2:No Photos] [N/A:N/A] Wound Location: [1:Right Malleolus - Lateral] [2:Right Malleolus - Medial] [N/A:N/A] Wounding Event: [1:Gradually Appeared] [2:Gradually Appeared] [N/A:N/A] Primary Etiology: [1:Venous Leg Ulcer] [2:Venous Leg Ulcer] [N/A:N/A] Comorbid History: [1:Cataracts, Chronic Obstructive Pulmonary Disease (COPD), Osteoarthritis] [2:Cataracts, Chronic Obstructive Pulmonary Disease (COPD), Osteoarthritis] [N/A:N/A] Date Acquired: [1:08/11/2015] [2:01/30/2016] [N/A:N/A] Weeks of Treatment: [1:51] [2:49] [N/A:N/A] Wound Status: [1:Open] [2:Open] [N/A:N/A] Clustered Wound: [1:No] [2:Yes] [N/A:N/A] Measurements L x W x  D 7.5x6x0.2 [2:2.2x1.5x0.2] [N/A:N/A] (cm) Area (cm) : [1:35.343] [2:2.592] [N/A:N/A] Volume (cm) : [1:7.069] [2:0.518] [N/A:N/A] % Reduction in Area: [1:-104.50%] [2:77.50%] [N/A:N/A] % Reduction in Volume: -36.40% [2:55.20%] [N/A:N/A] Classification: [1:Full Thickness Without Exposed Support Structures] [2:Full Thickness Without Exposed Support Structures] [N/A:N/A] Exudate Amount: [1:Large] [2:Large] [N/A:N/A] Exudate Type: [1:Serosanguineous] [2:Serosanguineous] [N/A:N/A] Exudate Color: [1:red, brown] [2:red, brown] [N/A:N/A] Wound Margin: [1:Distinct, outline attached] [2:Distinct, outline attached] [N/A:N/A] Granulation Amount: [1:None Present (0%)] [2:None Present (0%)] [N/A:N/A] Necrotic Amount: [1:Large (67-100%)] [2:Large (67-100%)] [N/A:N/A] Exposed Structures: [1:Fascia: No Fat Layer (Subcutaneous Tissue) Exposed: No] [2:Fascia: No Fat Layer (Subcutaneous Tissue) Exposed: No] [N/A:N/A] Tendon: No Tendon: No Muscle: No Muscle:  No Joint: No Joint: No Bone: No Bone: No Limited to Skin Limited to Skin Breakdown Breakdown Epithelialization: Small (1-33%) Small (1-33%) N/A Debridement: Debridement (60109- Debridement (32355- N/A 11047) 11047) Pre-procedure 08:19 08:19 N/A Verification/Time Out Taken: Pain Control: Lidocaine 4% Topical Lidocaine 4% Topical N/A Solution Solution Tissue Debrided: Fibrin/Slough, Exudates, Fibrin/Slough, Exudates, N/A Subcutaneous Subcutaneous Level: Skin/Subcutaneous Skin/Subcutaneous N/A Tissue Tissue Debridement Area (sq 45 3.3 N/A cm): Instrument: Other(scoop) Other(scoop) N/A Bleeding: Minimum Minimum N/A Hemostasis Achieved: Pressure Pressure N/A Procedural Pain: 0 0 N/A Post Procedural Pain: 0 0 N/A Debridement Treatment Procedure was tolerated Procedure was tolerated N/A Response: well well Post Debridement 7.5x6x0.2 2.2x1.5x0.2 N/A Measurements L x W x D (cm) Post Debridement 7.069 0.518 N/A Volume: (cm) Periwound Skin Texture: No Abnormalities Noted No Abnormalities Noted N/A Periwound Skin Maceration: Yes Maceration: Yes N/A Moisture: Periwound Skin Color: No Abnormalities Noted No Abnormalities Noted N/A Temperature: No Abnormality No Abnormality N/A Tenderness on No Yes N/A Palpation: Wound Preparation: Ulcer Cleansing: Ulcer Cleansing: N/A Rinsed/Irrigated with Rinsed/Irrigated with Saline, Other: soap and Saline, Other: soap and water water Topical Anesthetic Topical Anesthetic Applied: Other: lidocaine Applied: Other: lidocaine 4% 4% Procedures Performed: Debridement Debridement N/A Treatment Notes DONELL, TOMKINS ([id number]) Electronic Signature(s) Signed: 01/08/2017 4:47:06 PM By: [name] MD Entered By: [name] on 01/08/2017 08:44:07 [name] ([id number]) -------------------------------------------------------------------------------- Multi-Disciplinary Care Plan Details Patient Name: [name] Date of Service: 01/08/2017 8:00 AM Medical Record Number: [id number] Patient Account Number: [id number] Date of Birth/Sex: 01-07-1946 (71 y.o. Male) Treating RN: [name] Primary Care Quiara Killian: [name] Other Clinician: Referring Castulo Scarpelli: [name] Treating Shanda Cadotte/Extender: [name] Weeks in Treatment: 51 Active Inactive ` Venous Leg Ulcer Nursing Diagnoses: Knowledge deficit related to disease process and management Goals: Patient will maintain optimal edema control Date Initiated: 04/03/2016 Target Resolution Date: 11/24/2016 Goal Status: Active Interventions: Compression as ordered Treatment Activities: Therapeutic compression applied : 04/03/2016 Notes: ` Wound/Skin Impairment Nursing Diagnoses: Impaired tissue integrity Goals: Ulcer/skin breakdown will heal within 14 weeks Date Initiated: 01/17/2016 Target Resolution Date: 11/24/2016 Goal Status: Active Interventions: Assess ulceration(s) every visit Notes: Electronic Signature(s) Signed: 01/08/2017 4:34:49 PM By: Aurelio Jew, [name] ([id number]) Entered By: Alejandro Mulling on 01/08/2017 08:20:05 [name] ([id number]) -------------------------------------------------------------------------------- Pain Assessment Details Patient Name: [name] Date of Service: 01/08/2017 8:00 AM Medical Record Number: [id number] Patient Account Number: [id number] Date of Birth/Sex: [birth year] (71 y.o. Male) Treating RN: [name] Primary Care Sumayyah Custodio: [name] Other Clinician: Referring Devonne Lalani: [name] Treating Banyan Goodchild/Extender: [name] Weeks in Treatment: 51 Active Problems Location of Pain Severity and Description of Pain Patient Has Paino No Site Locations With Dressing Change: No Pain Management and Medication Current Pain Management: Electronic Signature(s) Signed: 01/08/2017 4:34:49 PM By: Alejandro Mulling Entered By:  Alejandro Mulling on 01/08/2017 08:03:50  [name] StackBLACKWELL, Sabastien C. ([id number]) -------------------------------------------------------------------------------- Patient/Caregiver Education Details [name], Jaccob Date of Service: 01/08/2017 8:00 AM Patient Name: C. Patient Account Number: [id number] Medical Record Treating RN: Phillis Haggisinkerton, [name] [id number] Number: Other Clinician: Date of Birth/Gender: 1946/03/17 [age] y.o. Male) Treating [name], [name] Primary Care Physician: Darreld McleanMILES, LINDA Physician/Extender: G Referring Physician: Mickey FarberMILES, LINDA Weeks in Treatment: 4251 Education Assessment Education Provided To: Patient Education Topics Provided Wound/Skin Impairment: Handouts: Other: do not get wrap wet and elevate leg when sitting Methods: Demonstration, Explain/Verbal Responses: State content correctly Electronic Signature(s) Signed: 01/08/2017 4:34:49 PM By: Alejandro MullingPinkerton, Debra Entered By: Alejandro MullingPinkerton, Debra on 01/08/2017 08:47:16 [name] StackBLACKWELL, Caroline C. ([id number]) -------------------------------------------------------------------------------- Wound Assessment Details Patient Name: [name] StackBLACKWELL, Martino C. Date of Service: 01/08/2017 8:00 AM Medical Record Number: [id number] Patient Account Number: [id number] Date of Birth/Sex: 1946/03/17 [age] y.o. Male) Treating RN: Ashok CordiaPinkerton, [name] Primary Care Eusebio Blazejewski: Darreld McleanMILES, LINDA Other Clinician: Referring Hilliary Jock: Darreld McleanMILES, LINDA Treating Breda Bond/Extender: Maxwell CaulOBSON, [name] G Weeks in Treatment: 51 Wound Status Wound Number: 1 Primary Venous Leg Ulcer Etiology: Wound Location: Right Malleolus - Lateral Wound Open Wounding Event: Gradually Appeared Status: Date Acquired: 08/11/2015 Comorbid Cataracts, Chronic Obstructive Weeks Of Treatment: 51 History: Pulmonary Disease (COPD), Clustered Wound: No Osteoarthritis Photos Photo Uploaded By: Alejandro MullingPinkerton, Debra on 01/08/2017 10:20:20 Wound Measurements Length: (cm) 7.5 Width: (cm) 6 Depth: (cm) 0.2 Area:  (cm) 35.343 Volume: (cm) 7.069 % Reduction in Area: -104.5% % Reduction in Volume: -36.4% Epithelialization: Small (1-33%) Tunneling: No Undermining: No Wound Description Full Thickness Without Exposed Foul Odor Afte Classification: Support Structures Slough/Fibrino Wound Margin: Distinct, outline attached Exudate Large Amount: Exudate Type: Serosanguineous Exudate Color: red, brown r Cleansing: No Yes Wound Bed Granulation Amount: None Present (0%) Exposed Structure Necrotic Amount: Large (67-100%) Fascia Exposed: No Limpert, Pookela C. ([id number]) Necrotic Quality: Adherent Slough Fat Layer (Subcutaneous Tissue) Exposed: No Tendon Exposed: No Muscle Exposed: No Joint Exposed: No Bone Exposed: No Limited to Skin Breakdown Periwound Skin Texture Texture Color No Abnormalities Noted: No No Abnormalities Noted: No Moisture Temperature / Pain No Abnormalities Noted: No Temperature: No Abnormality Maceration: Yes Wound Preparation Ulcer Cleansing: Rinsed/Irrigated with Saline, Other: soap and water, Topical Anesthetic Applied: Other: lidocaine 4%, Treatment Notes Wound #1 (Right, Lateral Malleolus) 1. Cleansed with: Clean wound with Normal Saline Cleanse wound with antibacterial soap and water 2. Anesthetic Topical Lidocaine 4% cream to wound bed prior to debridement 3. Peri-wound Care: Barrier cream 4. Dressing Applied: Other dressing (specify in notes) 5. Secondary Dressing Applied ABD Pad 7. Secured with Tape 3 Layer Compression System - Right Lower Extremity Notes unna to anchor, xtrasorb, drawtex, Polymem Ag Electronic Signature(s) Signed: 01/08/2017 4:34:49 PM By: Alejandro MullingPinkerton, Debra Entered By: Alejandro MullingPinkerton, Debra on 01/08/2017 08:15:08 [name] StackBLACKWELL, Dillan C. ([id number]) -------------------------------------------------------------------------------- Wound Assessment Details Patient Name: [name] StackBLACKWELL, Daouda C. Date of Service: 01/08/2017 8:00  AM Medical Record Number: [id number] Patient Account Number: [id number] Date of Birth/Sex: 1946/03/17 68(70 y.o. Male) Treating RN: Ashok CordiaPinkerton, [name] Primary Care Shara Hartis: Darreld McleanMILES, LINDA Other Clinician: Referring Satya Bohall: Darreld McleanMILES, LINDA Treating Kennis Buell/Extender: Maxwell CaulOBSON, [name] G Weeks in Treatment: 51 Wound Status Wound Number: 2 Primary Venous Leg Ulcer Etiology: Wound Location: Right Malleolus - Medial Wound Open Wounding Event: Gradually Appeared Status: Date Acquired: 01/30/2016 Comorbid Cataracts, Chronic Obstructive Weeks Of Treatment: 49 History: Pulmonary Disease (COPD), Clustered Wound: Yes Osteoarthritis Photos Photo Uploaded By: Alejandro MullingPinkerton, Debra on 01/08/2017 10:20:20 Wound Measurements Length: (cm) 2.2 Width: (cm) 1.5 Depth: (cm) 0.2 Area: (cm) 2.592 Volume: (cm) 0.518 % Reduction in Area: 77.5% % Reduction in Volume: 55.2% Epithelialization: Small (1-33%) Tunneling: No  Undermining: No Wound Description Full Thickness Without Exposed Foul Odor Afte Classification: Support Structures Slough/Fibrino Wound Margin: Distinct, outline attached Exudate Large Amount: Exudate Type: Serosanguineous Exudate Color: red, brown r Cleansing: No Yes Wound Bed Granulation Amount: None Present (0%) Exposed Structure Necrotic Amount: Large (67-100%) Fascia Exposed: No Marzan, Ritesh C. ([id number]) Necrotic Quality: Adherent Slough Fat Layer (Subcutaneous Tissue) Exposed: No Tendon Exposed: No Muscle Exposed: No Joint Exposed: No Bone Exposed: No Limited to Skin Breakdown Periwound Skin Texture Texture Color No Abnormalities Noted: No No Abnormalities Noted: No Moisture Temperature / Pain No Abnormalities Noted: No Temperature: No Abnormality Maceration: Yes Tenderness on Palpation: Yes Wound Preparation Ulcer Cleansing: Rinsed/Irrigated with Saline, Other: soap and water, Topical Anesthetic Applied: Other: lidocaine 4%, Treatment Notes Wound #2  (Right, Medial Malleolus) 1. Cleansed with: Clean wound with Normal Saline Cleanse wound with antibacterial soap and water 2. Anesthetic Topical Lidocaine 4% cream to wound bed prior to debridement 3. Peri-wound Care: Barrier cream 4. Dressing Applied: Other dressing (specify in notes) 5. Secondary Dressing Applied ABD Pad 7. Secured with Tape 3 Layer Compression System - Right Lower Extremity Notes unna to anchor, xtrasorb, drawtex, Polymem Ag Electronic Signature(s) Signed: 01/08/2017 4:34:49 PM By: Alejandro Mulling Entered By: Alejandro Mulling on 01/08/2017 08:16:11 [name] ([id number]) -------------------------------------------------------------------------------- Vitals Details Patient Name: [name] Date of Service: 01/08/2017 8:00 AM Medical Record Number: [id number] Patient Account Number: [id number] Date of Birth/Sex: 01-20-1946 (71 y.o. Male) Treating RN: Ashok Cordia, [name] Primary Care Acasia Skilton: [name] Other Clinician: Referring Brooklen Runquist: [name] Treating Malyia Moro/Extender: [name] Weeks in Treatment: 51 Vital Signs Time Taken: 08:03 Pulse (bpm): 72 Height (in): 76 Respiratory Rate (breaths/min): 18 Weight (lbs): 238 Blood Pressure (mmHg): 148/67 Body Mass Index (BMI): 29 Reference Range: 80 - 120 mg / dl Electronic Signature(s) Signed: 01/08/2017 4:34:49 PM By: Alejandro Mulling Entered By: Alejandro Mulling on 01/08/2017 08:06:07

## 2017-01-09 NOTE — Progress Notes (Signed)
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 Number: Other Clinician: Date of Birth/Sex: 1946-04-30 (71 y.o.  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, initial T85.613A encounter Disruption of external operation (surgical) wound, not elsewhere classified, subsequent T81.31XD encounter L97.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 of skin Facility Procedures CPT4: Description Modifier Quantity Code 62952841 11042 - DEB SUBQ TISSUE 20 SQ CM/< 1 ICD-10 Description Diagnosis I87.331 Chronic venous hypertension (idiopathic) with ulcer and inflammation of right lower extremity L97.212 Non-pressure chronic ulcer  of right calf with fat layer exposed CPT4: 32440102 11045 - DEB SUBQ TISS EA ADDL 20CM 2 ICD-10 Description Diagnosis  Z61.09697.212 Non-pressure chronic ulcer of right calf with fat layer exposed Physician Procedures CPT4: Description Modifier Quantity Code [id number] 11042 - WC PHYS SUBQ TISS 20 SQ CM 1 ICD-10 Description Diagnosis I87.331 [name], [name] ([id number]) Electronic Signature(s) Signed: 01/08/2017 4:47:06 PM By: Baltazar Najjarobson, Sabena Winner MD Entered By: Baltazar Najjarobson, Samaa Ueda on 01/08/2017 08:52:46

## 2017-01-11 DIAGNOSIS — I87331 Chronic venous hypertension (idiopathic) with ulcer and inflammation of right lower extremity: Secondary | ICD-10-CM | POA: Diagnosis not present

## 2017-01-13 NOTE — Progress Notes (Signed)
[name], QUANT ([id number]) Visit Report for 01/11/2017 Arrival Information Details Patient Name: [name], [name]. Date of Service: 01/11/2017 8:00 AM Medical Record Number: [id number] Patient Account Number: [id number] Date of Birth/Sex: [birth year] (71 y.o. Male) Treating RN: [name] Primary Care Jaqua Ching: [name] Other Clinician: Referring Allayna Erlich: [name] Treating Arminda Foglio/Extender: Rudene Re in Treatment: 51 Visit Information History Since Last Visit All ordered tests and consults were completed: No Patient Arrived: Ambulatory Added or deleted any medications: No Arrival Time: 08:01 Any new allergies or adverse reactions: No Accompanied By: self Had a fall or experienced change in No Transfer Assistance: None activities of daily living that may affect Patient Identification Verified: Yes risk of falls: Secondary Verification Process Yes Signs or symptoms of abuse/neglect since last No Completed: visito Patient Requires Transmission-Based No Hospitalized since last visit: No Precautions: Has Dressing in Place as Prescribed: Yes Patient Has Alerts: No Has Compression in Place as Prescribed: Yes Pain Present Now: No Electronic Signature(s) Signed: 01/11/2017 2:24:54 PM By: Alejandro Mulling Entered By: Alejandro Mulling on 01/11/2017 08:03:51 [name] ([id number]) -------------------------------------------------------------------------------- Encounter Discharge Information Details Patient Name: [name] Date of Service: 01/11/2017 8:00 AM Medical Record Number: [id number] Patient Account Number: [id number] Date of Birth/Sex: 19-Sep-1945 (71 y.o. Male) Treating RN: [name] Primary Care Shenicka Sunderlin: [name] Other Clinician: Referring Glendola Friedhoff: [name] Treating Jada Kuhnert/Extender: Rudene Re in Treatment: 46 Encounter Discharge Information Items Discharge Pain Level: 0 Discharge Condition:  Stable Ambulatory Status: Ambulatory Discharge Destination: Home Private Transportation: Auto Accompanied By: self Schedule Follow-up Appointment: Yes Medication Reconciliation completed and No provided to Patient/Care [place] Gamero: Clinical Summary of Care: Electronic Signature(s) Signed: 01/11/2017 2:24:54 PM By: Alejandro Mulling Entered By: Alejandro Mulling on 01/11/2017 08:47:45 [name] ([id number]) -------------------------------------------------------------------------------- Patient/Caregiver Education Details Patient Name: [name] Date of Service: 01/11/2017 8:00 AM Medical Record Number: [id number] Patient Account Number: [id number] Date of Birth/Gender: [birth year] (71 y.o. Male) Treating RN: [name] Primary Care Physician: [name] Other Clinician: Referring Physician: [name] Treating Physician/Extender: Rudene Re in Treatment: 61 Education Assessment Education Provided To: Patient Education Topics Provided Wound/Skin Impairment: Handouts: Other: change dressing as ordered Methods: Demonstration, Explain/Verbal Responses: State content correctly Electronic Signature(s) Signed: 01/11/2017 2:24:54 PM By: Alejandro Mulling Entered By: Alejandro Mulling on 01/11/2017 08:47:17 [name] ([id number]) -------------------------------------------------------------------------------- Wound Assessment Details Patient Name: [name] Date of Service: 01/11/2017 8:00 AM Medical Record Number: [id number] Patient Account Number: [id number] Date of Birth/Sex: December 17, 1945 (71 y.o. Male) Treating RN: Ashok Cordia, [name] Primary Care Judd Mccubbin: [name] Other Clinician: Referring Chloris Marcoux: [name] Treating Delmont Prosch/Extender: Rudene Re in Treatment: 51 Wound Status Wound Number: 1 Primary Venous Leg Ulcer Etiology: Wound Location: Right Malleolus - Lateral Wound Open Wounding Event: Gradually  Appeared Status: Date Acquired: 08/11/2015 Comorbid Cataracts, Chronic Obstructive Weeks Of Treatment: 51 History: Pulmonary Disease (COPD), Clustered Wound: No Osteoarthritis Photos Photo Uploaded By: Alejandro Mulling on 01/11/2017 14:17:48 Wound Measurements Length: (cm) 7.5 Width: (cm) 6 Depth: (cm) 0.2 Area: (cm) 35.343 Volume: (cm) 7.069 % Reduction in Area: -104.5% % Reduction in Volume: -36.4% Epithelialization: Small (1-33%) Tunneling: No Undermining: No Wound Description Full Thickness Without Exposed Classification: Support Structures Wound Margin: Distinct, outline attached Exudate Large Amount: Exudate Type: Serosanguineous Exudate Color: red, brown Foul Odor After Cleansing: No Slough/Fibrino Yes Wound Bed Granulation Amount: None Present (0%) Exposed Structure Necrotic Amount: Large (67-100%) Fascia Exposed: No Nater, Jaber C. ([id number]) Necrotic Quality: Adherent Slough Fat Layer (Subcutaneous Tissue) Exposed: No Tendon  Exposed: No Muscle Exposed: No Joint Exposed: No Bone Exposed: No Limited to Skin Breakdown Periwound Skin Texture Texture Color No Abnormalities Noted: No No Abnormalities Noted: No Moisture Temperature / Pain No Abnormalities Noted: No Temperature: No Abnormality Maceration: Yes Wound Preparation Ulcer Cleansing: Rinsed/Irrigated with Saline, Other: soap and water, Topical Anesthetic Applied: None, Other: lidocaine 4%, Treatment Notes Wound #1 (Right, Lateral Malleolus) 1. Cleansed with: Clean wound with Normal Saline 2. Anesthetic Topical Lidocaine 4% cream to wound bed prior to debridement 4. Dressing Applied: Other dressing (specify in notes) 5. Secondary Dressing Applied ABD Pad 7. Secured with Tape 3 Layer Compression System - Right Lower Extremity Notes xtrasorb, drawtex, PolyMem Ag Electronic Signature(s) Signed: 01/11/2017 2:24:54 PM By: Alejandro MullingPinkerton, Debra Entered By: Alejandro MullingPinkerton, Debra on  01/11/2017 08:04:35 [name] StackBLACKWELL, Letrell C. ([id number]) -------------------------------------------------------------------------------- Wound Assessment Details Patient Name: [name] StackBLACKWELL, Larsen C. Date of Service: 01/11/2017 8:00 AM Medical Record Number: [id number] Patient Account Number: [id number] Date of Birth/Sex: Jun 11, 1946 [age] y.o. Male) Treating RN: Ashok CordiaPinkerton, [name] Primary Care Shivaay Stormont: Darreld McleanMILES, LINDA Other Clinician: Referring Griselda Bramblett: Darreld McleanMILES, LINDA Treating Mehar Kirkwood/Extender: Rudene ReBritto, Errol Weeks in Treatment: 51 Wound Status Wound Number: 2 Primary Venous Leg Ulcer Etiology: Wound Location: Right Malleolus - Medial Wound Open Wounding Event: Gradually Appeared Status: Date Acquired: 01/30/2016 Comorbid Cataracts, Chronic Obstructive Weeks Of Treatment: 49 History: Pulmonary Disease (COPD), Clustered Wound: Yes Osteoarthritis Photos Photo Uploaded By: Alejandro MullingPinkerton, Debra on 01/11/2017 14:18:29 Wound Measurements Length: (cm) 2.2 Width: (cm) 1.5 Depth: (cm) 0.2 Area: (cm) 2.592 Volume: (cm) 0.518 % Reduction in Area: 77.5% % Reduction in Volume: 55.2% Epithelialization: Small (1-33%) Tunneling: No Undermining: No Wound Description Full Thickness Without Exposed Classification: Support Structures Wound Margin: Distinct, outline attached Exudate Large Amount: Exudate Type: Serosanguineous Exudate Color: red, brown Foul Odor After Cleansing: No Slough/Fibrino Yes Wound Bed Granulation Amount: None Present (0%) Exposed Structure Necrotic Amount: Large (67-100%) Fascia Exposed: No Skeet, Khoury C. ([id number]) Necrotic Quality: Adherent Slough Fat Layer (Subcutaneous Tissue) Exposed: No Tendon Exposed: No Muscle Exposed: No Joint Exposed: No Bone Exposed: No Limited to Skin Breakdown Periwound Skin Texture Texture Color No Abnormalities Noted: No No Abnormalities Noted: No Moisture Temperature / Pain No Abnormalities Noted: No Temperature: No  Abnormality Maceration: Yes Tenderness on Palpation: Yes Wound Preparation Ulcer Cleansing: Rinsed/Irrigated with Saline, Other: soap and water, Topical Anesthetic Applied: None, Other: lidocaine 4%, Treatment Notes Wound #2 (Right, Medial Malleolus) 1. Cleansed with: Clean wound with Normal Saline 2. Anesthetic Topical Lidocaine 4% cream to wound bed prior to debridement 4. Dressing Applied: Other dressing (specify in notes) 5. Secondary Dressing Applied ABD Pad 7. Secured with Tape 3 Layer Compression System - Right Lower Extremity Notes xtrasorb, drawtex, PolyMem Ag Electronic Signature(s) Signed: 01/11/2017 2:24:54 PM By: Alejandro MullingPinkerton, Debra Entered By: Alejandro MullingPinkerton, Debra on 01/11/2017 08:05:04

## 2017-01-15 ENCOUNTER — Encounter: Payer: Medicare HMO | Admitting: Internal Medicine

## 2017-01-15 ENCOUNTER — Other Ambulatory Visit
Admission: RE | Admit: 2017-01-15 | Discharge: 2017-01-15 | Disposition: A | Discharge status: Home / Self Care | Payer: Medicare HMO | Source: Ambulatory Visit | Attending: Internal Medicine | Admitting: Internal Medicine

## 2017-01-15 DIAGNOSIS — I87331 Chronic venous hypertension (idiopathic) with ulcer and inflammation of right lower extremity: Secondary | ICD-10-CM | POA: Diagnosis not present

## 2017-01-15 DIAGNOSIS — B998 Other infectious disease: Secondary | ICD-10-CM | POA: Insufficient documentation

## 2017-01-16 NOTE — Progress Notes (Signed)
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 following restrictions: Activity as tolerated Wound #2 Right,Medial Malleolus: Increase protein intake. OK to return to work with the  following restrictions: ZIAD, MAYE ([id number]) Activity as tolerated Laboratory ordered were: Wound culture routine - right lateral malleolus 1 C+S done. No emperic AB's 2 Polymen second week 3 wound is stalled I see no other issues Electronic Signature(s) Signed: 01/15/2017 3:44:58 PM By: [name] Najjar [name] Entered By: [name] on 01/15/2017 08:33:33 [name] ([id number]) -------------------------------------------------------------------------------- SuperBill Details [name] Date of Service: 01/15/2017 Patient Name: C. Patient Account Number: [id number] Medical Record Treating RN: [name] [id number] Number: Other Clinician: Date of Birth/Sex: 11/02/1945 (71 y.o. 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, initial T85.613A encounter Disruption of external operation (surgical) wound, not elsewhere classified, subsequent T81.31XD encounter L97.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 of skin Facility  Procedures CPT4: Description Modifier Quantity Code 16109604 11042 - DEB SUBQ TISSUE 20 SQ CM/< 1 ICD-10 Description Diagnosis I87.331 Chronic venous hypertension (idiopathic) with ulcer and inflammation of right lower extremity L97.212 Non-pressure chronic ulcer  of right calf with fat layer exposed CPT4: 54098119 11045 - DEB SUBQ TISS EA ADDL 20CM 2 ICD-10 Description Diagnosis L97.212 Non-pressure chronic ulcer of right calf with fat layer  exposed Physician Procedures CPT4: Description Modifier Quantity Code 1478295 11042 - WC PHYS SUBQ TISS 20 SQ CM 1 ICD-10 Description Diagnosis I87.331 SAL, SPRATLEY ([id number]) Electronic Signature(s) Signed: 01/15/2017 3:44:58 PM By: [name] Najjar [name] Entered By: [name] on 01/15/2017 08:34:00

## 2017-01-16 NOTE — Progress Notes (Signed)
MATHIEU, SCHLOEMER ([id number]) Visit Report for 01/15/2017 Arrival Information Details Patient Name: [name], [name]. Date of Service: 01/15/2017 8:00 AM Medical Record Number: [id number] Patient Account Number: [id number] Date of Birth/Sex: 03-Mar-1946 (71 y.o. Male) Treating RN: [name] Primary Care Owenn Rothermel: [name] Other Clinician: Referring Karmelo Bass: [name] Treating Skylee Baird/Extender: [place] in Treatment: 52 Visit Information History Since Last Visit All ordered tests and consults were completed: No Patient Arrived: Ambulatory Added or deleted any medications: No Arrival Time: 08:03 Any new allergies or adverse reactions: No Accompanied By: self Had a fall or experienced change in No Transfer Assistance: None activities of daily living that may affect Patient Identification Verified: Yes risk of falls: Secondary Verification Process Yes Signs or symptoms of abuse/neglect since last No Completed: visito Patient Requires Transmission-Based No Hospitalized since last visit: No Precautions: Has Dressing in Place as Prescribed: Yes Patient Has Alerts: No Has Compression in Place as Prescribed: Yes Pain Present Now: No Electronic Signature(s) Signed: 01/15/2017 3:34:08 PM By: Alejandro Mulling Entered By: Alejandro Mulling on 01/15/2017 08:04:04 [name] ([id number]) -------------------------------------------------------------------------------- Encounter Discharge Information Details Patient Name: [name] Date of Service: 01/15/2017 8:00 AM Medical Record Number: [id number] Patient Account Number: [id number] Date of Birth/Sex: 1945-12-08 (71 y.o. Male) Treating RN: [name] Primary Care Patina Spanier: [name] Other Clinician: Referring Mathew Postiglione: [name] Treating Shonnie Poudrier/Extender: [name] Weeks in Treatment: 37 Encounter Discharge Information Items Discharge Pain Level: 0 Discharge  Condition: Stable Ambulatory Status: Ambulatory Discharge Destination: Home Transportation: Private Auto Accompanied By: self Schedule Follow-up Appointment: Yes Medication Reconciliation completed No and provided to Patient/Care Scheryl Sanborn: Patient Clinical Summary of Care: Declined Electronic Signature(s) Signed: 01/15/2017 3:34:08 PM By: Alejandro Mulling Previous Signature: 01/15/2017 8:51:57 AM Version By: Gwenlyn Perking Entered By: Alejandro Mulling on 01/15/2017 09:25:41 [name] ([id number]) -------------------------------------------------------------------------------- Lower Extremity Assessment Details Patient Name: [name] Date of Service: 01/15/2017 8:00 AM Medical Record Number: [id number] Patient Account Number: [id number] Date of Birth/Sex: 02-08-1946 (71 y.o. Male) Treating RN: Ashok Cordia, [name] Primary Care Benford Asch: [name] Other Clinician: Referring Kinan Safley: [name] Treating Pearse Shiffler/Extender: [name] Weeks in Treatment: 52 Edema Assessment Assessed: [Left: No] [Right: No] E[Left: dema] [Right: :] Calf Left: Right: Point of Measurement: 40 cm From Medial Instep cm 34.4 cm Ankle Left: Right: Point of Measurement: 12 cm From Medial Instep cm 22.2 cm Vascular Assessment Pulses: Dorsalis Pedis Palpable: [Right:Yes] Posterior Tibial Extremity colors, hair growth, and conditions: Extremity Color: [Right:Normal] Temperature of Extremity: [Right:Warm] Capillary Refill: [Right:< 3 seconds] Toe Nail Assessment Left: Right: Thick: Yes Discolored: Yes Deformed: Yes Improper Length and Hygiene: Yes Electronic Signature(s) Signed: 01/15/2017 3:34:08 PM By: Alejandro Mulling Entered By: Alejandro Mulling on 01/15/2017 08:21:58 [name] ([id number]) -------------------------------------------------------------------------------- Multi Wound Chart Details Patient Name: [name] Date of Service:  01/15/2017 8:00 AM Medical Record Number: [id number] Patient Account Number: [id number] Date of Birth/Sex: 1946-05-28 (71 y.o. Male) Treating RN: Ashok Cordia, [name] Primary Care Eloyse Causey: [name] Other Clinician: Referring Ivana Nicastro: [name] Treating [place]/Extender: [name] Weeks in Treatment: 52 Vital Signs Height(in): 76 Pulse(bpm): 68 Weight(lbs): 238 Blood Pressure 154/66 (mmHg): Body Mass Index(BMI): 29 Temperature(F): 97.9 Respiratory Rate 18 (breaths/min): Photos: [1:No Photos] [2:No Photos] [N/A:N/A] Wound Location: [1:Right Malleolus - Lateral] [2:Right Malleolus - Medial] [N/A:N/A] Wounding Event: [1:Gradually Appeared] [2:Gradually Appeared] [N/A:N/A] Primary Etiology: [1:Venous Leg Ulcer] [2:Venous Leg Ulcer] [N/A:N/A] Comorbid History: [1:Cataracts, Chronic Obstructive Pulmonary Disease (COPD), Osteoarthritis] [2:Cataracts, Chronic Obstructive Pulmonary Disease (COPD), Osteoarthritis] [N/A:N/A]  Date Acquired: [1:08/11/2015] [2:01/30/2016] [N/A:N/A] Weeks of Treatment: [1:52] [2:50] [N/A:N/A] Wound Status: [1:Open] [2:Open] [N/A:N/A] Clustered Wound: [1:No] [2:Yes] [N/A:N/A] Measurements L x W x D 7x6x0.2 [2:2.5x1.5x0.2] [N/A:N/A] (cm) Area (cm) : [1:32.987] [2:2.945] [N/A:N/A] Volume (cm) : [1:6.597] [2:0.589] [N/A:N/A] % Reduction in Area: [1:-90.90%] [2:74.50%] [N/A:N/A] % Reduction in Volume: -27.30% [2:49.00%] [N/A:N/A] Classification: [1:Full Thickness Without Exposed Support Structures] [2:Full Thickness Without Exposed Support Structures] [N/A:N/A] Exudate Amount: [1:Large] [2:Large] [N/A:N/A] Exudate Type: [1:Serosanguineous] [2:Serosanguineous] [N/A:N/A] Exudate Color: [1:red, brown] [2:red, brown] [N/A:N/A] Wound Margin: [1:Distinct, outline attached] [2:Distinct, outline attached] [N/A:N/A] Granulation Amount: [1:None Present (0%)] [2:None Present (0%)] [N/A:N/A] Necrotic Amount: [1:Large (67-100%)] [2:Large (67-100%)]  [N/A:N/A] Exposed Structures: [1:Fascia: No Fat Layer (Subcutaneous Tissue) Exposed: No] [2:Fascia: No Fat Layer (Subcutaneous Tissue) Exposed: No] [N/A:N/A] Tendon: No Tendon: No Muscle: No Muscle: No Joint: No Joint: No Bone: No Bone: No Limited to Skin Limited to Skin Breakdown Breakdown Epithelialization: Small (1-33%) Small (1-33%) N/A Debridement: Debridement (16109(11042- Debridement (60454(11042- N/A 11047) 11047) Pre-procedure 08:20 08:20 N/A Verification/Time Out Taken: Pain Control: Lidocaine 4% Topical Lidocaine 4% Topical N/A Solution Solution Tissue Debrided: Fibrin/Slough, Exudates, Fibrin/Slough, Exudates, N/A Subcutaneous Subcutaneous Level: Skin/Subcutaneous Skin/Subcutaneous N/A Tissue Tissue Debridement Area (sq 42 3.75 N/A cm): Instrument: Curette Curette N/A Specimen: Swab None N/A Number of Specimens 1 N/A N/A Taken: Bleeding: Minimum Minimum N/A Hemostasis Achieved: Pressure Pressure N/A Procedural Pain: 0 0 N/A Post Procedural Pain: 0 0 N/A Debridement Treatment Procedure was tolerated Procedure was tolerated N/A Response: well well Post Debridement 7x6x0.2 2.5x1.5x0.2 N/A Measurements L x W x D (cm) Post Debridement 6.597 0.589 N/A Volume: (cm) Periwound Skin Texture: No Abnormalities Noted No Abnormalities Noted N/A Periwound Skin Maceration: Yes Maceration: Yes N/A Moisture: Periwound Skin Color: No Abnormalities Noted No Abnormalities Noted N/A Temperature: No Abnormality No Abnormality N/A Tenderness on No Yes N/A Palpation: Wound Preparation: Ulcer Cleansing: Ulcer Cleansing: N/A Rinsed/Irrigated with Rinsed/Irrigated with Saline, Other: soap and Saline, Other: soap and water water Topical Anesthetic Topical Anesthetic Applied: None, Other: Applied: None, Other: lidocaine 4% lidocaine 4% [name] StackBLACKWELL, [name] ([id number]) Procedures Performed: Debridement Debridement N/A Treatment Notes Electronic Signature(s) Signed: 01/15/2017  3:44:58 PM By: Baltazar Najjarobson, [name] MD Entered By: Baltazar Najjarobson, [name] on 01/15/2017 08:28:49 [name] StackBLACKWELL, [name] ([id number]) -------------------------------------------------------------------------------- Multi-Disciplinary Care Plan Details Patient Name: [name] StackBLACKWELL, Maurilio C. Date of Service: 01/15/2017 8:00 AM Medical Record Number: [id number] Patient Account Number: [id number] Date of Birth/Sex: 21-Dec-1945 [age] y.o. Male) Treating RN: Phillis HaggisPinkerton, [name] Primary Care Loreal Schuessler: Darreld McleanMILES, LINDA Other Clinician: Referring Quitman Norberto: Darreld McleanMILES, LINDA Treating Brissia Delisa/Extender: Maxwell CaulOBSON, [name] G Weeks in Treatment: 52 Active Inactive ` Venous Leg Ulcer Nursing Diagnoses: Knowledge deficit related to disease process and management Goals: Patient will maintain optimal edema control Date Initiated: 04/03/2016 Target Resolution Date: 11/24/2016 Goal Status: Active Interventions: Compression as ordered Treatment Activities: Therapeutic compression applied : 04/03/2016 Notes: ` Wound/Skin Impairment Nursing Diagnoses: Impaired tissue integrity Goals: Ulcer/skin breakdown will heal within 14 weeks Date Initiated: 01/17/2016 Target Resolution Date: 11/24/2016 Goal Status: Active Interventions: Assess ulceration(s) every visit Notes: Electronic Signature(s) Signed: 01/15/2017 3:34:08 PM By: Aurelio JewPinkerton, Debra Gandolfi, [name] ([id number]) Entered By: Alejandro MullingPinkerton, Debra on 01/15/2017 08:22:11 [name] StackBLACKWELL, [name] ([id number]) -------------------------------------------------------------------------------- Pain Assessment Details Patient Name: [name] StackBLACKWELL, [name] Date of Service: 01/15/2017 8:00 AM Medical Record Number: [id number] Patient Account Number: [id number] Date of Birth/Sex: 21-Dec-1945 [age] y.o. Male) Treating RN: Phillis HaggisPinkerton, [name] Primary Care Tonjua Rossetti: Darreld McleanMILES, LINDA Other Clinician: Referring Shalie Schremp: Darreld McleanMILES, LINDA Treating [name] Redmon/Extender: Maxwell CaulOBSON, [name] G Weeks in Treatment: 5652 Active  Problems Location of  Pain Severity and Description of Pain Patient Has Paino No Site Locations With Dressing Change: No Character of Pain Describe the Pain: Sharp Pain Management and Medication Current Pain Management: Electronic Signature(s) Signed: 01/15/2017 3:34:08 PM By: Alejandro Mulling Entered By: Alejandro Mulling on 01/15/2017 08:04:17 [name] ([id number]) -------------------------------------------------------------------------------- Patient/Caregiver Education Details [name] Date of Service: 01/15/2017 8:00 AM Patient Name: C. Patient Account Number: [id number] Medical Record Treating RN: [name] [id number] Number: Other Clinician: Date of Birth/Gender: 10-Aug-1946 (71 y.o. Male) Treating [name], [name] Primary Care Physician: [name] Physician/Extender: G Referring Physician: [name] in Treatment: 51 Education Assessment Education Provided To: Patient Education Topics Provided Wound/Skin Impairment: Handouts: Other: change dressing as ordered Methods: Demonstration, Explain/Verbal Responses: State content correctly Electronic Signature(s) Signed: 01/15/2017 3:34:08 PM By: Alejandro Mulling Entered By: Alejandro Mulling on 01/15/2017 09:25:57 [name] ([id number]) -------------------------------------------------------------------------------- Wound Assessment Details Patient Name: [name] Date of Service: 01/15/2017 8:00 AM Medical Record Number: [id number] Patient Account Number: [id number] Date of Birth/Sex: June 20, 1946 (71 y.o. Male) Treating RN: Ashok Cordia, [name] Primary Care Jahnyla Parrillo: [name] Other Clinician: Referring Dewane Timson: [name] Treating Kateena Degroote/Extender: [name] Weeks in Treatment: 52 Wound Status Wound Number: 1 Primary Venous Leg Ulcer Etiology: Wound Location: Right Malleolus - Lateral Wound Open Wounding Event: Gradually Appeared Status: Date Acquired:  08/11/2015 Comorbid Cataracts, Chronic Obstructive Weeks Of Treatment: 52 History: Pulmonary Disease (COPD), Clustered Wound: No Osteoarthritis Photos Photo Uploaded By: Alejandro Mulling on 01/15/2017 13:18:44 Wound Measurements Length: (cm) 7 Width: (cm) 6 Depth: (cm) 0.2 Area: (cm) 32.987 Volume: (cm) 6.597 % Reduction in Area: -90.9% % Reduction in Volume: -27.3% Epithelialization: Small (1-33%) Tunneling: No Undermining: No Wound Description Full Thickness Without Exposed Foul Odor Afte Classification: Support Structures Slough/Fibrino Wound Margin: Distinct, outline attached Exudate Large Amount: Exudate Type: Serosanguineous Exudate Color: red, brown r Cleansing: No Yes Wound Bed Granulation Amount: None Present (0%) Exposed Structure Necrotic Amount: Large (67-100%) Fascia Exposed: No [name], [name] ([id number]) Necrotic Quality: Adherent Slough Fat Layer (Subcutaneous Tissue) Exposed: No Tendon Exposed: No Muscle Exposed: No Joint Exposed: No Bone Exposed: No Limited to Skin Breakdown Periwound Skin Texture Texture Color No Abnormalities Noted: No No Abnormalities Noted: No Moisture Temperature / Pain No Abnormalities Noted: No Temperature: No Abnormality Maceration: Yes Wound Preparation Ulcer Cleansing: Rinsed/Irrigated with Saline, Other: soap and water, Topical Anesthetic Applied: None, Other: lidocaine 4%, Treatment Notes Wound #1 (Right, Lateral Malleolus) 1. Cleansed with: Clean wound with Normal Saline Cleanse wound with antibacterial soap and water 2. Anesthetic Topical Lidocaine 4% cream to wound bed prior to debridement 3. Peri-wound Care: Barrier cream Moisturizing lotion 4. Dressing Applied: Other dressing (specify in notes) 5. Secondary Dressing Applied ABD Pad 7. Secured with Tape 3 Layer Compression System - Right Lower Extremity Notes unna to anchor, xtrasorb, drawtex, PolyMem Ag Electronic  Signature(s) Signed: 01/15/2017 3:34:08 PM By: Alejandro Mulling Entered By: Alejandro Mulling on 01/15/2017 08:15:28 [name] ([id number]) -------------------------------------------------------------------------------- Wound Assessment Details Patient Name: [name] Date of Service: 01/15/2017 8:00 AM Medical Record Number: [id number] Patient Account Number: [id number] Date of Birth/Sex: May 23, 1946 (71 y.o. Male) Treating RN: [name] Primary Care Julien Oscar: [name] Other Clinician: Referring Fareeda Downard: [name] Treating Madysin Crisp/Extender: [name] Weeks in Treatment: 52 Wound Status Wound Number: 2 Primary Venous Leg Ulcer Etiology: Wound Location: Right Malleolus - Medial Wound Open Wounding Event: Gradually Appeared Status: Date Acquired: 01/30/2016 Comorbid Cataracts, Chronic Obstructive Weeks Of Treatment: 50 History: Pulmonary Disease (COPD), Clustered Wound:  Yes Osteoarthritis Photos Photo Uploaded By: Alejandro Mulling on 01/15/2017 13:18:45 Wound Measurements Length: (cm) 2.5 Width: (cm) 1.5 Depth: (cm) 0.2 Area: (cm) 2.945 Volume: (cm) 0.589 % Reduction in Area: 74.5% % Reduction in Volume: 49% Epithelialization: Small (1-33%) Tunneling: No Undermining: No Wound Description Full Thickness Without Exposed Foul Odor Afte Classification: Support Structures Slough/Fibrino Wound Margin: Distinct, outline attached Exudate Large Amount: Exudate Type: Serosanguineous Exudate Color: red, brown r Cleansing: No Yes Wound Bed Granulation Amount: None Present (0%) Exposed Structure Necrotic Amount: Large (67-100%) Fascia Exposed: No [name], Eran C. ([id number]) Necrotic Quality: Adherent Slough Fat Layer (Subcutaneous Tissue) Exposed: No Tendon Exposed: No Muscle Exposed: No Joint Exposed: No Bone Exposed: No Limited to Skin Breakdown Periwound Skin Texture Texture Color No Abnormalities Noted: No No  Abnormalities Noted: No Moisture Temperature / Pain No Abnormalities Noted: No Temperature: No Abnormality Maceration: Yes Tenderness on Palpation: Yes Wound Preparation Ulcer Cleansing: Rinsed/Irrigated with Saline, Other: soap and water, Topical Anesthetic Applied: None, Other: lidocaine 4%, Treatment Notes Wound #2 (Right, Medial Malleolus) 1. Cleansed with: Clean wound with Normal Saline Cleanse wound with antibacterial soap and water 2. Anesthetic Topical Lidocaine 4% cream to wound bed prior to debridement 3. Peri-wound Care: Barrier cream Moisturizing lotion 4. Dressing Applied: Other dressing (specify in notes) 5. Secondary Dressing Applied ABD Pad 7. Secured with Tape 3 Layer Compression System - Right Lower Extremity Notes unna to anchor, xtrasorb, drawtex, PolyMem Ag Electronic Signature(s) Signed: 01/15/2017 3:34:08 PM By: Alejandro Mulling Entered By: Alejandro Mulling on 01/15/2017 08:16:11 [name] ([id number]) -------------------------------------------------------------------------------- Vitals Details Patient Name: [name] Date of Service: 01/15/2017 8:00 AM Medical Record Number: [id number] Patient Account Number: [id number] Date of Birth/Sex: 12-27-1945 (71 y.o. Male) Treating RN: Ashok Cordia, [name] Primary Care Juniper Cobey: [name] Other Clinician: Referring Turkessa Ostrom: [name] Treating Mallery Harshman/Extender: [name] Weeks in Treatment: 52 Vital Signs Time Taken: 08:04 Temperature (F): 97.9 Height (in): 76 Pulse (bpm): 68 Weight (lbs): 238 Respiratory Rate (breaths/min): 18 Body Mass Index (BMI): 29 Blood Pressure (mmHg): 154/66 Reference Range: 80 - 120 mg / dl Electronic Signature(s) Signed: 01/15/2017 3:34:08 PM By: Alejandro Mulling Entered By: Alejandro Mulling on 01/15/2017 08:06:03

## 2017-01-18 DIAGNOSIS — I87331 Chronic venous hypertension (idiopathic) with ulcer and inflammation of right lower extremity: Secondary | ICD-10-CM | POA: Diagnosis not present

## 2017-01-18 LAB — AEROBIC CULTURE W GRAM STAIN (SUPERFICIAL SPECIMEN)

## 2017-01-18 LAB — AEROBIC CULTURE  (SUPERFICIAL SPECIMEN)

## 2017-01-19 NOTE — Progress Notes (Signed)
[name], [name] ([id number]) Visit Report for 01/18/2017 Arrival Information Details Patient Name: [name], [name] Date of Service: 01/18/2017 8:00 AM Medical Record Number: [id number] Patient Account Number: [id number] Date of Birth/Sex: 1946/01/01 46(70 y.o. Male) Treating RN: Huel CoventryWoody, Kim Primary Care Dayannara Pascal: Darreld McleanMILES, LINDA Other Clinician: Referring Jaleiah Asay: Darreld McleanMILES, LINDA Treating Cleva Camero/Extender: Rudene ReBritto, Errol Weeks in Treatment: 6952 Visit Information History Since Last Visit Added or deleted any medications: No Patient Arrived: Ambulatory Any new allergies or adverse reactions: No Arrival Time: 08:00 Had a fall or experienced change in No Accompanied By: self activities of daily living that may affect Transfer Assistance: None risk of falls: Patient Identification Verified: Yes Has Dressing in Place as Prescribed: Yes Secondary Verification Process Yes Pain Present Now: No Completed: Patient Requires Transmission-Based No Precautions: Patient Has Alerts: No Electronic Signature(s) Signed: 01/18/2017 9:42:09 AM By: Elliot GurneyWoody, BSN, RN, CWS, Kim RN, BSN Entered By: Elliot GurneyWoody, BSN, RN, CWS, Kim on 01/18/2017 08:00:21 [name], [name] ([id number]) -------------------------------------------------------------------------------- Compression Therapy Details Patient Name: [name], [name] Date of Service: 01/18/2017 8:00 AM Medical Record Number: [id number] Patient Account Number: [id number] Date of Birth/Sex: 1946/01/01 [age] y.o. Male) Treating RN: Huel CoventryWoody, Kim Primary Care Tameron Lama: Darreld McleanMILES, LINDA Other Clinician: Referring Kimberleigh Mehan: Darreld McleanMILES, LINDA Treating Cella Cappello/Extender: Rudene ReBritto, Errol Weeks in Treatment: 152 Compression Therapy Performed for Wound Wound #1 Right,Lateral Malleolus Assessment: Performed By: Cletus Gashlinician Woody, Kim, RN Compression Type: Three Layer Pre Treatment ABI: 1.2 Electronic Signature(s) Signed: 01/18/2017 9:42:09 AM By: Elliot GurneyWoody, BSN, RN, CWS, Kim RN,  BSN Entered By: Elliot GurneyWoody, BSN, RN, CWS, Kim on 01/18/2017 08:14:46 [name], [name] ([id number]) -------------------------------------------------------------------------------- Encounter Discharge Information Details Patient Name: [name], [name] Date of Service: 01/18/2017 8:00 AM Medical Record Number: [id number] Patient Account Number: [id number] Date of Birth/Sex: 1946/01/01 30(70 y.o. Male) Treating RN: Huel CoventryWoody, Kim Primary Care Ashea Winiarski: Darreld McleanMILES, LINDA Other Clinician: Referring Halbert Jesson: Darreld McleanMILES, LINDA Treating Davy Westmoreland/Extender: Rudene ReBritto, Errol Weeks in Treatment: 6452 Encounter Discharge Information Items Discharge Pain Level: 0 Discharge Condition: Stable Ambulatory Status: Ambulatory Discharge Destination: Home Private Transportation: Auto Accompanied By: self Schedule Follow-up Appointment: Yes Medication Reconciliation completed and Yes provided to Patient/Care Donda Friedli: Clinical Summary of Care: Electronic Signature(s) Signed: 01/18/2017 9:42:09 AM By: Elliot GurneyWoody, BSN, RN, CWS, Kim RN, BSN Entered By: Elliot GurneyWoody, BSN, RN, CWS, Kim on 01/18/2017 08:16:55 [name], [name] ([id number]) -------------------------------------------------------------------------------- Patient/Caregiver Education Details Patient Name: [name], [name] Date of Service: 01/18/2017 8:00 AM Medical Record Number: [id number] Patient Account Number: [id number] Date of Birth/Gender: 1946/01/01 [age] y.o. Male) Treating RN: Huel CoventryWoody, Kim Primary Care Physician: Darreld McleanMILES, LINDA Other Clinician: Referring Physician: Darreld McleanMILES, LINDA Treating Physician/Extender: Rudene ReBritto, Errol Weeks in Treatment: 4752 Education Assessment Education Provided To: Patient Education Topics Provided Venous: Handouts: Controlling Swelling with Multilayered Compression Wraps Methods: Demonstration Responses: State content correctly Electronic Signature(s) Signed: 01/18/2017 9:42:09 AM By: Elliot GurneyWoody, BSN, RN, CWS, Kim RN, BSN Entered By:  Elliot GurneyWoody, BSN, RN, CWS, Kim on 01/18/2017 08:16:41 [name], [name] ([id number]) -------------------------------------------------------------------------------- Wound Assessment Details Patient Name: [name], [name] Date of Service: 01/18/2017 8:00 AM Medical Record Number: [id number] Patient Account Number: [id number] Date of Birth/Sex: 1946/01/01 68(70 y.o. Male) Treating RN: Huel CoventryWoody, Kim Primary Care [name]: Darreld McleanMILES, LINDA Other Clinician: Referring [name]: Darreld McleanMILES, LINDA Treating Belissa Kooy/Extender: Rudene ReBritto, Errol Weeks in Treatment: 52 Wound Status Wound Number: 1 Primary Etiology: Venous Leg Ulcer Wound Location: Right, Lateral Malleolus Wound Status: Open Wounding Event: Gradually Appeared Date Acquired: 08/11/2015 Weeks Of Treatment: 52 Clustered Wound: No Photos Photo Uploaded By: Elliot GurneyWoody, BSN, RN, CWS, Kim on 01/18/2017 08:29:37 Wound Measurements Length: (cm)  6.8 Width: (cm) 4 Depth: (cm) 0.2 Area: (cm) 21.363 Volume: (cm) 4.273 % Reduction in Area: -23.6% % Reduction in Volume: 17.6% Wound Description Full Thickness Without Exposed Classification: Support Structures Periwound Skin Texture Texture Color No Abnormalities Noted: No No Abnormalities Noted: No Moisture No Abnormalities Noted: No Treatment Notes Wound #1 (Right, Lateral Malleolus) 1. Cleansed with: Clean wound with Normal Saline [name], [name] ([id number]) 2. Anesthetic Topical Lidocaine 4% cream to wound bed prior to debridement 3. Peri-wound Care: Barrier cream Ointment 5. Secondary Dressing Applied ABD Pad 7. Secured with 3 Layer Compression System - Right Lower Extremity Notes Polymem, xsorb Electronic Signature(s) Signed: 01/18/2017 9:42:09 AM By: Elliot Gurney, BSN, RN, CWS, Kim RN, BSN Entered By: Elliot Gurney, BSN, RN, CWS, Kim on 01/18/2017 08:09:16 [name] Stack ([id number]) -------------------------------------------------------------------------------- Wound Assessment  Details Patient Name: [name] Stack Date of Service: 01/18/2017 8:00 AM Medical Record Number: [id number] Patient Account Number: [id number] Date of Birth/Sex: 1946-02-28 (71 y.o. Male) Treating RN: Huel Coventry Primary Care Tarshia Kot: [name] Other Clinician: Referring Ambrosio Reuter: [name] Treating Lisandro Meggett/Extender: Rudene Re in Treatment: 52 Wound Status Wound Number: 2 Primary Etiology: Venous Leg Ulcer Wound Location: Right, Medial Malleolus Wound Status: Open Wounding Event: Gradually Appeared Date Acquired: 01/30/2016 Weeks Of Treatment: 50 Clustered Wound: Yes Photos Photo Uploaded By: Elliot Gurney, BSN, RN, CWS, Kim on 01/18/2017 08:29:37 Wound Measurements Length: (cm) 2.5 Width: (cm) 1.5 Depth: (cm) 0.2 Area: (cm) 2.945 Volume: (cm) 0.589 % Reduction in Area: 74.5% % Reduction in Volume: 49% Wound Description Full Thickness Without Exposed Classification: Support Structures Periwound Skin Texture Texture Color No Abnormalities Noted: No No Abnormalities Noted: No Moisture No Abnormalities Noted: No Treatment Notes Wound #2 (Right, Medial Malleolus) 1. Cleansed with: Clean wound with Normal Saline Keathley, Ellsworth C. ([id number]) 2. Anesthetic Topical Lidocaine 4% cream to wound bed prior to debridement 3. Peri-wound Care: Barrier cream Ointment 5. Secondary Dressing Applied ABD Pad 7. Secured with 3 Layer Compression System - Right Lower Extremity Notes Polymem, xsorb Electronic Signature(s) Signed: 01/18/2017 9:42:09 AM By: Elliot Gurney, BSN, RN, CWS, Kim RN, BSN Entered By: Elliot Gurney, BSN, RN, CWS, Kim on 01/18/2017 08:09:17

## 2017-01-22 ENCOUNTER — Encounter: Payer: Medicare HMO | Admitting: Internal Medicine

## 2017-01-22 DIAGNOSIS — I87331 Chronic venous hypertension (idiopathic) with ulcer and inflammation of right lower extremity: Secondary | ICD-10-CM | POA: Diagnosis not present

## 2017-01-23 NOTE — Progress Notes (Signed)
[name] ([id number]) Visit Report for 01/22/2017 Arrival Information Details Patient Name: [name]. Date of Service: 01/22/2017 8:15 AM Medical Record Number: [id number] Patient Account Number: [id number] Date of Birth/Sex: 1946-05-13 (71 y.o. Male) Treating [name]: [name] Primary Care [name]: [name] Other Clinician: Referring [name]: [name] Treating [name]/Extender: [name] in Treatment: 53 Visit Information History Since Last Visit Added or deleted any medications: No Patient Arrived: Ambulatory Any new allergies or adverse reactions: Yes Arrival Time: 08:03 Had a fall or experienced change in No Accompanied By: self activities of daily living that may affect Transfer Assistance: None risk of falls: Patient Identification Verified: Yes Signs or symptoms of abuse/neglect since last No Secondary Verification Process Yes visito Completed: Hospitalized since last visit: No Patient Requires Transmission-Based No Has Dressing in Place as Prescribed: Yes Precautions: Has Compression in Place as Prescribed: Yes Patient Has Alerts: No Pain Present Now: No Electronic Signature(s) Signed: 01/22/2017 5:11:39 PM By: [name] Entered By: [name], BSN, [name], CWS, [name] on 01/22/2017 57:84:69 [name] ([id number]) -------------------------------------------------------------------------------- Encounter Discharge Information Details Patient Name: [name] Date of Service: 01/22/2017 8:15 AM Medical Record Number: [id number] Patient Account Number: [id number] Date of Birth/Sex: [birth year] (71 y.o. Male) Treating [name]: Phillis [name] Primary Care Zahmir Lalla: [name] Other Clinician: Referring Celina Shiley: [name] Treating Styles Fambro/Extender: [name] in Treatment: 53 Encounter Discharge Information Items Discharge Pain Level: 0 Discharge Condition: Stable Ambulatory  Status: Ambulatory Discharge Destination: Home Transportation: Private Auto Accompanied By: self Schedule Follow-up Appointment: Yes Medication Reconciliation completed Yes and provided to Patient/Care Charmagne Buhl: Patient Clinical Summary of Care: Declined Electronic Signature(s) Signed: 01/22/2017 5:11:39 PM By: [name] Previous Signature: 01/22/2017 8:46:55 AM Version By: [name] Entered By: [name] on 01/22/2017 08:48:55 [name] ([id number]) -------------------------------------------------------------------------------- Lower Extremity Assessment Details Patient Name: [name] Date of Service: 01/22/2017 8:15 AM Medical Record Number: [id number] Patient Account Number: [id number] Date of Birth/Sex: 04-16-1946 (71 y.o. Male) Treating [name]: [name] Primary Care Azeneth Carbonell: [name] Other Clinician: Referring Azani Brogdon: [name] Treating Theron Cumbie/Extender: [name] in Treatment: 52 Vascular Assessment Pulses: Dorsalis Pedis Palpable: [Right:Yes] Posterior Tibial Extremity colors, hair growth, and conditions: Extremity Color: [Right:Mottled] Hair Growth on Extremity: [Right:No] Temperature of Extremity: [Right:Warm] Capillary Refill: [Right:< 3 seconds] Toe Nail Assessment Left: Right: Thick: Yes Discolored: Yes Deformed: Yes Improper Length and Hygiene: Yes Notes 2nd toe has attached black toenail. Electronic Signature(s) Signed: 01/22/2017 5:11:39 PM By: [name] Entered By: [name], BSN, [name], CWS, [name] on 01/22/2017 08:19:55 [name] ([id number]) -------------------------------------------------------------------------------- Multi Wound Chart Details Patient Name: [name] Date of Service: 01/22/2017 8:15 AM Medical Record Number: [id number] Patient Account Number: [id number] Date of Birth/Sex: 04-Sep-1945 (71 y.o. Male) Treating [name]:  [name], [name] Primary Care [name]: [name] Other Clinician: Referring Carmelo Reidel: [name] Treating [name]/Extender: [name] Weeks in Treatment: 53 Vital Signs Height(in): 76 Pulse(bpm): 72 Weight(lbs): 238 Blood Pressure 135/56 (mmHg): Body Mass Index(BMI): 29 Temperature(F): 98.2 Respiratory Rate 18 (breaths/min): Photos: [N/A:N/A] Wound Location: Right Malleolus - Lateral Right Malleolus - Medial N/A Wounding Event: Gradually Appeared Gradually Appeared N/A Primary Etiology: Venous Leg Ulcer Venous Leg Ulcer N/A Comorbid History: Cataracts, Chronic Cataracts, Chronic N/A Obstructive Pulmonary Obstructive Pulmonary Disease (COPD), Disease (COPD), Osteoarthritis Osteoarthritis Date Acquired: 08/11/2015 01/30/2016 N/A Weeks of Treatment: 53 51 N/A Wound Status: Open Open  N/A Clustered Wound: No Yes N/A Measurements L x W x D 5.5x3x0.2 3x2.5x0.2 N/A (cm) Area (cm) : 12.959 5.89 N/A Volume (cm) : 2.592 1.178 N/A % Reduction in Area: 25.00% 49.00% N/A % Reduction in Volume: 50.00% -2.00% N/A Classification: Full Thickness Without Full Thickness Without N/A Exposed Support Exposed Support Structures Structures Exudate Amount: Large Large N/A Exudate Type: Serous Serous N/A Exudate Color: amber amber N/A Wound Margin: Indistinct, nonvisible Indistinct, nonvisible N/A Razavi, [name] ([id number]) Granulation Amount: None Present (0%) None Present (0%) N/A Necrotic Amount: Large (67-100%) Large (67-100%) N/A Exposed Structures: Fat Layer (Subcutaneous Fat Layer (Subcutaneous N/A Tissue) Exposed: Yes Tissue) Exposed: Yes Fascia: No Fascia: No Tendon: No Tendon: No Muscle: No Muscle: No Joint: No Joint: No Bone: No Bone: No Epithelialization: Small (1-33%) None N/A Periwound Skin Texture: No Abnormalities Noted Scarring: Yes N/A Periwound Skin Maceration: Yes Maceration: Yes N/A Moisture: Periwound Skin Color: Erythema: Yes No  Abnormalities Noted N/A Tenderness on No No N/A Palpation: Wound Preparation: Ulcer Cleansing: Other: Ulcer Cleansing: Other: N/A soap and water soa and water Topical Anesthetic Topical Anesthetic Applied: Other: lidocaine Applied: Other: Lidocaone 4% 4% Treatment Notes Electronic Signature(s) Signed: 01/22/2017 5:12:21 PM By: [name] MD Entered By: [name] on 01/22/2017 08:39:23 [name] ([id number]) -------------------------------------------------------------------------------- Multi-Disciplinary Care Plan Details Patient Name: [name] Date of Service: 01/22/2017 8:15 AM Medical Record Number: [id number] Patient Account Number: [id number] Date of Birth/Sex: [birth year] (71 y.o. Male) Treating [name]: [name] Primary Care Kanav Kazmierczak: [name] Other Clinician: Referring Safari Cinque: [name] Treating Merilee Wible/Extender: [name] Weeks in Treatment: 29 Active Inactive ` Venous Leg Ulcer Nursing Diagnoses: Knowledge deficit related to disease process and management Goals: Patient will maintain optimal edema control Date Initiated: 04/03/2016 Target Resolution Date: 11/24/2016 Goal Status: Active Interventions: Compression as ordered Treatment Activities: Therapeutic compression applied : 04/03/2016 Notes: ` Wound/Skin Impairment Nursing Diagnoses: Impaired tissue integrity Goals: Ulcer/skin breakdown will heal within 14 weeks Date Initiated: 01/17/2016 Target Resolution Date: 11/24/2016 Goal Status: Active Interventions: Assess ulceration(s) every visit Notes: Electronic Signature(s) Signed: 01/22/2017 5:11:39 PM By: [name] [place], [name] ([id number]) Entered By: [name], BSN, [name], CWS, [name] on 01/22/2017 08:43:16 [name] ([id number]) -------------------------------------------------------------------------------- Pain Assessment Details Patient Name: [name] Date of  Service: 01/22/2017 8:15 AM Medical Record Number: [id number] Patient Account Number: [id number] Date of Birth/Sex: Sep 03, 1945 (71 y.o. Male) Treating [name]: [name] Primary Care Emagene Merfeld: [name] Other Clinician: Referring Sybella Harnish: [name] Treating Janika Jedlicka/Extender: [name] [place] in Treatment: 7 Active Problems Location of Pain Severity and Description of Pain Patient Has Paino No Site Locations With Dressing Change: No Pain Management and Medication Current Pain Management: Electronic Signature(s) Signed: 01/22/2017 5:11:39 PM By: [name] Entered By: [name], BSN, [name], CWS, [name] on 01/22/2017 08:08:34 [name] ([id number]) -------------------------------------------------------------------------------- Patient/Caregiver Education Details [name] Date of Service: 01/22/2017 8:15 AM Patient Name: C. Patient Account Number: [id number] Medical Record Treating [name]: [name] [id number] Number: Other Clinician: Date of Birth/Gender: 04/27/1946 (71 y.o. Male) Treating [name] Primary Care Physician: [name] Physician/Extender: G Referring Physician: [name] in Treatment: 25 Education Assessment Education Provided To: Patient Education Topics Provided Venous: Handouts: Controlling Swelling with Multilayered Compression Wraps Methods: Demonstration, Explain/Verbal Responses: State content correctly Wound/Skin Impairment: Handouts: Caring for Your Ulcer Methods: Explain/Verbal Responses: State content correctly Electronic Signature(s) Signed: 01/22/2017 5:11:39 PM By: [name]  Entered By: [name] GurneyWoody, BSN, [name], CWS, [name] on 01/22/2017 08:49:21 [name], At[place] C. ([id number]) -------------------------------------------------------------------------------- Wound Assessment Details Patient Name: [name], [name] Date of Service: 01/22/2017 8:15 AM Medical Record Number:  [id number] Patient Account Number: [id number] Date of Birth/Sex: [birth year] [age] y.o. Male) Treating [name]: [name] CoventryWoody, [name] Primary Care Erhardt Dada: [name] McleanMILES, LINDA Other Clinician: Referring Kenzlei Runions: [name] McleanMILES, LINDA Treating Kaleiah Kutzer/Extender: [name] CaulOBSON, [name] G Weeks in Treatment: 53 Wound Status Wound Number: 1 Primary Venous Leg Ulcer Etiology: Wound Location: Right Malleolus - Lateral Wound Open Wounding Event: Gradually Appeared Status: Date Acquired: 08/11/2015 Comorbid Cataracts, Chronic Obstructive Weeks Of Treatment: 53 History: Pulmonary Disease (COPD), Clustered Wound: No Osteoarthritis Photos Wound Measurements Length: (cm) 5.5 Width: (cm) 3 Depth: (cm) 0.2 Area: (cm) 12.959 Volume: (cm) 2.592 % Reduction in Area: 25% % Reduction in Volume: 50% Epithelialization: Small (1-33%) Tunneling: No Undermining: No Wound Description Full Thickness Without Exposed Foul Odor After Classification: Support Structures Slough/Fibrino Wound Margin: Indistinct, nonvisible Exudate Large Amount: Exudate Type: Serous Exudate Color: amber Cleansing: No Yes Wound Bed Granulation Amount: None Present (0%) Exposed Structure Necrotic Amount: Large (67-100%) Fascia Exposed: No Necrotic Quality: Adherent Slough Fat Layer (Subcutaneous Tissue) Exposed: Yes Tendon Exposed: No Muscle Exposed: No [name], [name] ([id number]) Joint Exposed: No Bone Exposed: No Periwound Skin Texture Texture Color No Abnormalities Noted: No No Abnormalities Noted: No Erythema: Yes Moisture No Abnormalities Noted: No Maceration: Yes Wound Preparation Ulcer Cleansing: Other: soap and water, Topical Anesthetic Applied: Other: lidocaine 4%, Treatment Notes Wound #1 (Right, Lateral Malleolus) 1. Cleansed with: Clean wound with Normal Saline 2. Anesthetic Topical Lidocaine 4% cream to wound bed prior to debridement 3. Peri-wound Care: Barrier cream 4. Dressing Applied: Iodoflex 5. Secondary  Dressing Applied ABD Pad Foam 7. Secured with 3 Layer Compression System - Right Lower Extremity Electronic Signature(s) Signed: 01/22/2017 5:11:39 PM By: [name] GurneyWoody, BSN, [name], CWS, [name], [name] Entered By: [name] GurneyWoody, BSN, [name], CWS, [name] on 01/22/2017 08:16:58 [name], [name] ([id number]) -------------------------------------------------------------------------------- Wound Assessment Details Patient Name: [name], [name] Date of Service: 01/22/2017 8:15 AM Medical Record Number: [id number] Patient Account Number: [id number] Date of Birth/Sex: [birth year] 82(70 y.o. Male) Treating [name]: [name] CoventryWoody, [name] Primary Care Shelsy Seng: [name] McleanMILES, LINDA Other Clinician: Referring Margert Edsall: [name] McleanMILES, LINDA Treating Tynia Wiers/Extender: [name] CaulOBSON, [name] G Weeks in Treatment: 53 Wound Status Wound Number: 2 Primary Venous Leg Ulcer Etiology: Wound Location: Right Malleolus - Medial Wound Open Wounding Event: Gradually Appeared Status: Date Acquired: 01/30/2016 Comorbid Cataracts, Chronic Obstructive Weeks Of Treatment: 51 History: Pulmonary Disease (COPD), Clustered Wound: Yes Osteoarthritis Photos Wound Measurements Length: (cm) 3 Width: (cm) 2.5 Depth: (cm) 0.2 Area: (cm) 5.89 Volume: (cm) 1.178 % Reduction in Area: 49% % Reduction in Volume: -2% Epithelialization: None Tunneling: No Wound Description Full Thickness Without Exposed Foul Odor After Classification: Support Structures Slough/Fibrino Wound Margin: Indistinct, nonvisible Exudate Large Amount: Exudate Type: Serous Exudate Color: amber Cleansing: No Yes Wound Bed Granulation Amount: None Present (0%) Exposed Structure Necrotic Amount: Large (67-100%) Fascia Exposed: No Necrotic Quality: Adherent Slough Fat Layer (Subcutaneous Tissue) Exposed: Yes Tendon Exposed: No Muscle Exposed: No [name], [name] ([id number]) Joint Exposed: No Bone Exposed: No Periwound Skin Texture Texture Color No Abnormalities Noted: No No  Abnormalities Noted: No Scarring: Yes Moisture No Abnormalities Noted: No Maceration: Yes Wound Preparation Ulcer Cleansing: Other: soa and water, Topical Anesthetic Applied: Other: Lidocaone 4%, Treatment Notes Wound #2 (Right, Medial Malleolus) 1. Cleansed with: Clean wound with Normal Saline 2. Anesthetic Topical Lidocaine 4% cream to wound bed prior to debridement 3.  Peri-wound Care: Barrier cream 4. Dressing Applied: Iodoflex 5. Secondary Dressing Applied ABD Pad Foam 7. Secured with 3 Layer Compression System - Right Lower Extremity Electronic Signature(s) Signed: 01/22/2017 5:11:39 PM By: [name] Entered By: [name], BSN, [name], CWS, [name] on 01/22/2017 08:18:10 [name] ([id number]) -------------------------------------------------------------------------------- Vitals Details Patient Name: [name] Date of Service: 01/22/2017 8:15 AM Medical Record Number: [id number] Patient Account Number: [id number] Date of Birth/Sex: 1946/03/29 (71 y.o. Male) Treating [name]: [name] Primary Care Donya Tomaro: [name] Other Clinician: Referring Johnna Bollier: [name] Treating Labrisha Wuellner/Extender: [name] Weeks in Treatment: 53 Vital Signs Time Taken: 08:08 Temperature (F): 98.2 Height (in): 76 Pulse (bpm): 72 Weight (lbs): 238 Respiratory Rate (breaths/min): 18 Body Mass Index (BMI): 29 Blood Pressure (mmHg): 135/56 Reference Range: 80 - 120 mg / dl Electronic Signature(s) Signed: 01/22/2017 5:11:39 PM By: [name] Entered By: [name], BSN, [name], CWS, [name] on 01/22/2017 78:29:56

## 2017-01-24 NOTE — Progress Notes (Addendum)
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 Birth/Sex: November 14, 1945 (71 y.o. 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, initial T85.613A encounter Disruption of external operation (surgical) wound, not elsewhere classified, subsequent T81.31XD encounter L97.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 of skin Facility Procedures CPT4: Description Modifier Quantity Code 38756433 11042 - DEB SUBQ TISSUE 20 SQ CM/< 1 ICD-10 Description Diagnosis I87.331 Chronic venous hypertension (idiopathic) with ulcer and inflammation of right lower extremity L97.212 Non-pressure chronic ulcer  of right calf with fat layer exposed Physician Procedures CPT4: Description Modifier Quantity Code 2951884 11042 - WC PHYS SUBQ TISS 20 SQ CM 1 ICD-10 Description Diagnosis I87.331 Chronic venous hypertension (idiopathic) with ulcer and inflammation of right lower extremity L97.212 Non-pressure chronic ulcer of  right calf with fat layer exposed Electronic Signature(s) Haymond, Argus C. ([id number])  Signed: 01/22/2017 5:12:21 PM By: [name] Najjar MD Entered By: [name] on 01/22/2017 10:54:14

## 2017-01-25 ENCOUNTER — Encounter: Payer: Medicare HMO | Attending: Surgery

## 2017-01-25 DIAGNOSIS — Z7901 Long term (current) use of anticoagulants: Secondary | ICD-10-CM | POA: Insufficient documentation

## 2017-01-25 DIAGNOSIS — T8131XD Disruption of external operation (surgical) wound, not elsewhere classified, subsequent encounter: Secondary | ICD-10-CM | POA: Diagnosis not present

## 2017-01-25 DIAGNOSIS — I87331 Chronic venous hypertension (idiopathic) with ulcer and inflammation of right lower extremity: Secondary | ICD-10-CM | POA: Insufficient documentation

## 2017-01-25 DIAGNOSIS — T85613A 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, initial encounter: Secondary | ICD-10-CM | POA: Insufficient documentation

## 2017-01-25 DIAGNOSIS — Y832 Surgical operation with anastomosis, bypass or graft as the cause of abnormal reaction of the patient, or of later complication, without mention of misadventure at the time of the procedure: Secondary | ICD-10-CM | POA: Diagnosis not present

## 2017-01-25 DIAGNOSIS — L97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 of skin: Secondary | ICD-10-CM | POA: Insufficient documentation

## 2017-01-25 DIAGNOSIS — L97212 Non-pressure chronic ulcer of right calf with fat layer exposed: Secondary | ICD-10-CM | POA: Insufficient documentation

## 2017-01-25 DIAGNOSIS — Z86718 Personal history of other venous thrombosis and embolism: Secondary | ICD-10-CM | POA: Diagnosis not present

## 2017-01-27 NOTE — Progress Notes (Signed)
[name], [name] ([id number]) Visit Report for 01/25/2017 Arrival Information Details Patient Name: [name], [name] Date of Service: 01/25/2017 8:00 AM Medical Record Number: [id number] Patient Account Number: [id number] Date of Birth/Sex: [birth year] [age] y.o. Male) Treating RN: Phillis HaggisPinkerton, [name] Primary Care Jeanette Moffatt: Darreld McleanMILES, LINDA Other Clinician: Referring Shana Younge: Darreld McleanMILES, LINDA Treating Vetta Couzens/Extender: Rudene ReBritto, Errol Weeks in Treatment: 6753 Visit Information History Since Last Visit All ordered tests and consults were completed: No Patient Arrived: Ambulatory Added or deleted any medications: No Arrival Time: 08:03 Any new allergies or adverse reactions: No Accompanied By: self Had a fall or experienced change in No Transfer Assistance: None activities of daily living that may affect Patient Identification Verified: Yes risk of falls: Secondary Verification Process Yes Signs or symptoms of abuse/neglect since last No Completed: visito Patient Requires Transmission-Based No Hospitalized since last visit: No Precautions: Has Dressing in Place as Prescribed: Yes Patient Has Alerts: No Has Compression in Place as Prescribed: Yes Pain Present Now: No Electronic Signature(s) Signed: 01/25/2017 4:23:26 PM By: Alejandro MullingPinkerton, Debra Entered By: Alejandro MullingPinkerton, Debra on 01/25/2017 08:10:05 [name], [name] ([id number]) -------------------------------------------------------------------------------- Encounter Discharge Information Details Patient Name: [name], [name] Date of Service: 01/25/2017 8:00 AM Medical Record Number: [id number] Patient Account Number: [id number] Date of Birth/Sex: [birth year] [age] y.o. Male) Treating RN: Phillis HaggisPinkerton, [name] Primary Care Alano Blasco: Darreld McleanMILES, LINDA Other Clinician: Referring Kirt Chew: Darreld McleanMILES, LINDA Treating Saliou Barnier/Extender: Rudene ReBritto, Errol Weeks in Treatment: 2253 Encounter Discharge Information Items Discharge Pain Level: 0 Discharge Condition:  Stable Ambulatory Status: Ambulatory Discharge Destination: Home Private Transportation: Auto Accompanied By: self Schedule Follow-up Appointment: Yes Medication Reconciliation completed and No provided to Patient/Care Javana Schey: Clinical Summary of Care: Electronic Signature(s) Signed: 01/25/2017 4:23:26 PM By: Alejandro MullingPinkerton, Debra Entered By: Alejandro MullingPinkerton, Debra on 01/25/2017 08:40:17 [name], [name] ([id number]) -------------------------------------------------------------------------------- Patient/Caregiver Education Details Patient Name: [name], [name] Date of Service: 01/25/2017 8:00 AM Medical Record Number: [id number] Patient Account Number: [id number] Date of Birth/Gender: [birth year] [age] y.o. Male) Treating RN: Phillis HaggisPinkerton, [name] Primary Care Physician: Darreld McleanMILES, LINDA Other Clinician: Referring Physician: Darreld McleanMILES, LINDA Treating Physician/Extender: Rudene ReBritto, Errol Weeks in Treatment: 7353 Education Assessment Education Provided To: Patient Education Topics Provided Wound/Skin Impairment: Handouts: Other: do not get wrap wet Methods: Demonstration, Explain/Verbal Responses: State content correctly Electronic Signature(s) Signed: 01/25/2017 4:23:26 PM By: Alejandro MullingPinkerton, Debra Entered By: Alejandro MullingPinkerton, Debra on 01/25/2017 08:40:01 [name], [name] ([id number]) -------------------------------------------------------------------------------- Wound Assessment Details Patient Name: [name], [name] Date of Service: 01/25/2017 8:00 AM Medical Record Number: [id number] Patient Account Number: [id number] Date of Birth/Sex: [birth year] [age] y.o. Male) Treating RN: Ashok CordiaPinkerton, [name] Primary Care Jayen Bromwell: Darreld McleanMILES, LINDA Other Clinician: Referring Tayo Maute: Darreld McleanMILES, LINDA Treating Joziyah Roblero/Extender: Rudene ReBritto, Errol Weeks in Treatment: 53 Wound Status Wound Number: 1 Primary Venous Leg Ulcer Etiology: Wound Location: Right Malleolus - Lateral Wound Open Wounding Event: Gradually  Appeared Status: Date Acquired: 08/11/2015 Comorbid Cataracts, Chronic Obstructive Weeks Of Treatment: 53 History: Pulmonary Disease (COPD), Clustered Wound: No Osteoarthritis Wound Measurements Length: (cm) 5.5 Width: (cm) 3 Depth: (cm) 0.2 Area: (cm) 12.959 Volume: (cm) 2.592 % Reduction in Area: 25% % Reduction in Volume: 50% Epithelialization: Small (1-33%) Tunneling: No Undermining: No Wound Description Full Thickness Without Exposed Classification: Support Structures Wound Margin: Indistinct, nonvisible Exudate Large Amount: Exudate Type: Serous Exudate Color: amber Foul Odor After Cleansing: No Slough/Fibrino Yes Wound Bed Granulation Amount: None Present (0%) Exposed Structure Necrotic Amount: Large (67-100%) Fascia Exposed: No Necrotic Quality: Adherent Slough Fat Layer (Subcutaneous Tissue) Exposed: Yes Tendon Exposed: No Muscle Exposed: No Joint Exposed: No Bone Exposed: No Periwound Skin Texture  Texture Color No Abnormalities Noted: No No Abnormalities Noted: No Erythema: Yes Moisture No Abnormalities Noted: No Sheek, [name] Maceration: Yes Wound Preparation Ulcer Cleansing: Other: soap and water, Topical Anesthetic Applied: None, Other: lidocaine 4%, Treatment Notes Wound #1 (Right, Lateral Malleolus) 1. Cleansed with: Clean wound with Normal Saline 3. Peri-wound Care: Barrier cream Other peri-wound care (specify in notes) 4. Dressing Applied: Iodoflex 5. Secondary Dressing Applied ABD Pad Dry Gauze Foam 7. Secured with Tape 3 Layer Compression System - Right Lower Extremity Notes unna to anchor Electronic Signature(s) Signed: 01/25/2017 4:23:26 PM By: Alejandro Mulling Entered By: Alejandro Mulling on 01/25/2017 08:10:49 [name] Stack ([id number]) -------------------------------------------------------------------------------- Wound Assessment Details Patient Name: [name] Stack Date of  Service: 01/25/2017 8:00 AM Medical Record Number: [id number] Patient Account Number: [id number] Date of Birth/Sex: [birth year] (71 y.o. Male) Treating RN: Ashok Cordia, [name] Primary Care Christalyn Goertz: [name] Other Clinician: Referring Lelaina Oatis: [name] Treating Tanylah Schnoebelen/Extender: Rudene Re in Treatment: 53 Wound Status Wound Number: 2 Primary Venous Leg Ulcer Etiology: Wound Location: Right Malleolus - Medial Wound Open Wounding Event: Gradually Appeared Status: Date Acquired: 01/30/2016 Comorbid Cataracts, Chronic Obstructive Weeks Of Treatment: 51 History: Pulmonary Disease (COPD), Clustered Wound: Yes Osteoarthritis Wound Measurements Length: (cm) 3 Width: (cm) 2.5 Depth: (cm) 0.25 Area: (cm) 5.89 Volume: (cm) 1.473 % Reduction in Area: 49% % Reduction in Volume: -27.5% Epithelialization: None Tunneling: No Undermining: No Wound Description Full Thickness Without Exposed Foul Odor After Classification: Support Structures Slough/Fibrino Wound Margin: Indistinct, nonvisible Exudate Large Amount: Exudate Type: Serous Exudate Color: amber Cleansing: No Yes Wound Bed Granulation Amount: None Present (0%) Exposed Structure Necrotic Amount: Large (67-100%) Fascia Exposed: No Necrotic Quality: Adherent Slough Fat Layer (Subcutaneous Tissue) Exposed: Yes Tendon Exposed: No Muscle Exposed: No Joint Exposed: No Bone Exposed: No Periwound Skin Texture Texture Color No Abnormalities Noted: No No Abnormalities Noted: No Scarring: Yes Moisture Metzner, [name] ([id number]) No Abnormalities Noted: No Maceration: Yes Wound Preparation Ulcer Cleansing: Other: soap and water, Topical Anesthetic Applied: None, Other: Lidocaone 4%, Treatment Notes Wound #2 (Right, Medial Malleolus) 1. Cleansed with: Clean wound with Normal Saline 3. Peri-wound Care: Barrier cream Other peri-wound care (specify in notes) 4. Dressing Applied: Iodoflex 5.  Secondary Dressing Applied ABD Pad Dry Gauze Foam 7. Secured with Tape 3 Layer Compression System - Right Lower Extremity Notes unna to anchor Electronic Signature(s) Signed: 01/25/2017 4:23:26 PM By: Alejandro Mulling Entered By: Alejandro Mulling on 01/25/2017 08:11:14

## 2017-01-29 ENCOUNTER — Encounter: Payer: Medicare HMO | Admitting: Internal Medicine

## 2017-01-29 DIAGNOSIS — I87331 Chronic venous hypertension (idiopathic) with ulcer and inflammation of right lower extremity: Secondary | ICD-10-CM | POA: Diagnosis not present

## 2017-01-30 NOTE — Progress Notes (Signed)
[name], [name] ([id number]) Visit Report for 01/29/2017 Arrival Information Details Patient Name: [name], [name] Date of Service: 01/29/2017 8:00 AM Medical Record Number: [id number] Patient Account Number: [id number] Date of Birth/Sex: [birth year] [age] y.o. Male) Treating RN: Phillis HaggisPinkerton, [name] Primary Care Huma Imhoff: Darreld McleanMILES, LINDA Other Clinician: Referring Cloris Flippo: Darreld McleanMILES, LINDA Treating Sakeena Teall/Extender: Altamese CarolinaOBSON, [name] G Weeks in Treatment: 54 Visit Information History Since Last Visit All ordered tests and consults were No Patient Arrived: Ambulatory completed: Arrival Time: 08:01 Added or deleted any medications: No Accompanied By: self Any new allergies or adverse reactions: No Transfer Assistance: None Had a fall or experienced change in No Patient Identification Verified: Yes activities of daily living that may affect Secondary Verification Process Yes risk of falls: Completed: Signs or symptoms of abuse/neglect No Patient Requires Transmission-Based No since last visito Precautions: Hospitalized since last visit: No Patient Has Alerts: No Has Dressing in Place as Prescribed: Yes Has Footwear/Offloading in Place as Yes Prescribed: Right: Surgical Shoe with Pressure Relief Insole Pain Present Now: No Electronic Signature(s) Signed: 01/29/2017 5:03:58 PM By: Alejandro MullingPinkerton, Debra Entered By: Alejandro MullingPinkerton, Debra on 01/29/2017 08:03:38 [name], [name] ([id number]) -------------------------------------------------------------------------------- Encounter Discharge Information Details Patient Name: [name], [name] Date of Service: 01/29/2017 8:00 AM Medical Record Number: [id number] Patient Account Number: [id number] Date of Birth/Sex: [birth year] [age] y.o. Male) Treating RN: Phillis HaggisPinkerton, [name] Primary Care Kelon Easom: Darreld McleanMILES, LINDA Other Clinician: Referring Welda Azzarello: Darreld McleanMILES, LINDA Treating Alfonza Toft/Extender: Altamese CarolinaOBSON, [name] G Weeks in Treatment: 1354 Encounter Discharge  Information Items Discharge Pain Level: 0 Discharge Condition: Stable Ambulatory Status: Ambulatory Discharge Destination: Home Transportation: Private Auto Accompanied By: self Schedule Follow-up Appointment: Yes Medication Reconciliation completed No and provided to Patient/Care Shakirah Kirkey: Patient Clinical Summary of Care: Declined Electronic Signature(s) Signed: 01/29/2017 8:53:48 AM By: Francie MassingKelly, Tia Entered By: Francie MassingKelly, Tia on 01/29/2017 08:53:48 [name], [name] ([id number]) -------------------------------------------------------------------------------- Lower Extremity Assessment Details Patient Name: [name], [name] Date of Service: 01/29/2017 8:00 AM Medical Record Number: [id number] Patient Account Number: [id number] Date of Birth/Sex: [birth year] [age] y.o. Male) Treating RN: Phillis HaggisPinkerton, [name] Primary Care Zayvier Caravello: Darreld McleanMILES, LINDA Other Clinician: Referring Lindley Stachnik: Darreld McleanMILES, LINDA Treating Macari Zalesky/Extender: Maxwell CaulOBSON, [name] G Weeks in Treatment: 54 Vascular Assessment Pulses: Dorsalis Pedis Palpable: [Left:Yes] [Right:Yes] Posterior Tibial Extremity colors, hair growth, and conditions: Extremity Color: [Right:Normal] Temperature of Extremity: [Right:Warm] Capillary Refill: [Right:< 3 seconds] Toe Nail Assessment Left: Right: Thick: Yes Discolored: Yes Deformed: No Improper Length and Hygiene: Yes Electronic Signature(s) Signed: 01/29/2017 5:03:58 PM By: Alejandro MullingPinkerton, Debra Entered By: Alejandro MullingPinkerton, Debra on 01/29/2017 08:19:15 [name], [name] ([id number]) -------------------------------------------------------------------------------- Multi Wound Chart Details Patient Name: [name], [name] Date of Service: 01/29/2017 8:00 AM Medical Record Number: [id number] Patient Account Number: [id number] Date of Birth/Sex: [birth year] [age] y.o. Male) Treating RN: Ashok CordiaPinkerton, [name] Primary Care Sharone Picchi: Darreld McleanMILES, LINDA Other Clinician: Referring Olyvia Gopal: Darreld McleanMILES, LINDA Treating  Shirlean Berman/Extender: Maxwell CaulOBSON, [name] G Weeks in Treatment: 54 Vital Signs Height(in): 76 Pulse(bpm): 76 Weight(lbs): 238 Blood Pressure 141/73 (mmHg): Body Mass Index(BMI): 29 Temperature(F): 98.0 Respiratory Rate 18 (breaths/min): Photos: [1:No Photos] [2:No Photos] [N/A:N/A] Wound Location: [1:Right Malleolus - Lateral] [2:Right Malleolus - Medial] [N/A:N/A] Wounding Event: [1:Gradually Appeared] [2:Gradually Appeared] [N/A:N/A] Primary Etiology: [1:Venous Leg Ulcer] [2:Venous Leg Ulcer] [N/A:N/A] Comorbid History: [1:Cataracts, Chronic Obstructive Pulmonary Disease (COPD), Osteoarthritis] [2:Cataracts, Chronic Obstructive Pulmonary Disease (COPD), Osteoarthritis] [N/A:N/A] Date Acquired: [1:08/11/2015] [2:01/30/2016] [N/A:N/A] Weeks of Treatment: [1:54] [2:52] [N/A:N/A] Wound Status: [1:Open] [2:Open] [N/A:N/A] Clustered Wound: [1:No] [2:Yes] [N/A:N/A] Measurements L x W x D 5x6.5x0.5 [2:2.5x2.3x0.2] [N/A:N/A] (cm) Area (cm) : [1:25.525] [2:4.516] [N/A:N/A] Volume (cm) : [  1:12.763] [2:0.903] [N/A:N/A] % Reduction in Area: [1:-47.70%] [2:60.90%] [N/A:N/A] % Reduction in Volume: -146.20% [2:21.80%] [N/A:N/A] Classification: [1:Full Thickness Without Exposed Support Structures] [2:Full Thickness Without Exposed Support Structures] [N/A:N/A] Exudate Amount: [1:Large] [2:Large] [N/A:N/A] Exudate Type: [1:Purulent] [2:Serous] [N/A:N/A] Exudate Color: [1:yellow, brown, green] [2:amber] [N/A:N/A] Wound Margin: [1:Indistinct, nonvisible] [2:Indistinct, nonvisible] [N/A:N/A] Granulation Amount: [1:None Present (0%)] [2:Small (1-33%)] [N/A:N/A] Granulation Quality: [1:N/A] [2:Pink] [N/A:N/A] Necrotic Amount: [1:Large (67-100%)] [2:Large (67-100%)] [N/A:N/A] Exposed Structures: [1:Fat Layer (Subcutaneous Tissue) Exposed: Yes] [2:Fat Layer (Subcutaneous Tissue) Exposed: Yes] [N/A:N/A] Fascia: No Fascia: No Tendon: No Tendon: No Muscle: No Muscle: No Joint: No Joint: No Bone:  No Bone: No Epithelialization: Small (1-33%) None N/A Periwound Skin Texture: No Abnormalities Noted Scarring: Yes N/A Periwound Skin Maceration: Yes Maceration: Yes N/A Moisture: Periwound Skin Color: Erythema: Yes No Abnormalities Noted N/A Tenderness on No No N/A Palpation: Wound Preparation: Ulcer Cleansing: Other: Ulcer Cleansing: Other: N/A soap and water soap and water Topical Anesthetic Topical Anesthetic Applied: None, Other: Applied: None, Other: lidocaine 4% Lidocaone 4% Treatment Notes Electronic Signature(s) Signed: 01/29/2017 5:27:39 PM By: [name] MD Entered By: [name] on 01/29/2017 08:27:07 [name] Stack ([id number]) -------------------------------------------------------------------------------- Multi-Disciplinary Care Plan Details Patient Name: [name] Stack Date of Service: 01/29/2017 8:00 AM Medical Record Number: [id number] Patient Account Number: [id number] Date of Birth/Sex: 1946/03/22 (71 y.o. Male) Treating RN: [name] Primary Care Braylee Lal: [name] Other Clinician: Referring Kelli Egolf: [name] Treating Barrie Sigmund/Extender: [name] Weeks in Treatment: 54 Active Inactive ` Venous Leg Ulcer Nursing Diagnoses: Knowledge deficit related to disease process and management Goals: Patient will maintain optimal edema control Date Initiated: 04/03/2016 Target Resolution Date: 11/24/2016 Goal Status: Active Interventions: Compression as ordered Treatment Activities: Therapeutic compression applied : 04/03/2016 Notes: ` Wound/Skin Impairment Nursing Diagnoses: Impaired tissue integrity Goals: Ulcer/skin breakdown will heal within 14 weeks Date Initiated: 01/17/2016 Target Resolution Date: 11/24/2016 Goal Status: Active Interventions: Assess ulceration(s) every visit Notes: Electronic Signature(s) Signed: 01/29/2017 5:03:58 PM By: Aurelio Jew, [name] ([id number]) Entered By: Alejandro Mulling on 01/29/2017 08:19:32 [name] Stack ([id number]) -------------------------------------------------------------------------------- Pain Assessment Details Patient Name: [name] Stack Date of Service: 01/29/2017 8:00 AM Medical Record Number: [id number] Patient Account Number: [id number] Date of Birth/Sex: [birth year] (71 y.o. Male) Treating RN: [name] Primary Care Nazar Kuan: [name] Other Clinician: Referring Khoi Hamberger: [name] Treating Bradie Sangiovanni/Extender: [name] Weeks in Treatment: 54 Active Problems Location of Pain Severity and Description of Pain Patient Has Paino No Site Locations With Dressing Change: No Pain Management and Medication Current Pain Management: Electronic Signature(s) Signed: 01/29/2017 5:03:58 PM By: Alejandro Mulling Entered By: Alejandro Mulling on 01/29/2017 08:03:45 [name] Stack ([id number]) -------------------------------------------------------------------------------- Patient/Caregiver Education Details [name] Date of Service: 01/29/2017 8:00 AM Patient Name: C. Patient Account Number: [id number] Medical Record Treating RN: [name] [id number] Number: Other Clinician: Date of Birth/Gender: [birth year] (71 y.o. Male) Treating [name], [name] Primary Care Physician: [name] Physician/Extender: G Referring Physician: [name] in Treatment: 41 Education Assessment Education Provided To: Patient Education Topics Provided Wound/Skin Impairment: Handouts: Other: change dressing as ordered Methods: Demonstration, Explain/Verbal Responses: State content correctly Electronic Signature(s) Signed: 01/29/2017 5:03:58 PM By: Alejandro Mulling Entered By: Alejandro Mulling on 01/29/2017 08:21:52 [name] Stack ([id number]) -------------------------------------------------------------------------------- Wound Assessment Details Patient Name: [name] Stack Date of Service:  01/29/2017 8:00 AM Medical Record Number: [id number] Patient Account Number: [id number] Date of Birth/Sex: 07-26-1946 (71 y.o. Male) Treating RN: [name] Primary Care Lyndzee Kliebert: [name] Other Clinician: Referring Saraih Lorton:  MILES, LINDA Treating Digna Countess/Extender: [name] Weeks in Treatment: 54 Wound Status Wound Number: 1 Primary Venous Leg Ulcer Etiology: Wound Location: Right Malleolus - Lateral Wound Open Wounding Event: Gradually Appeared Status: Date Acquired: 08/11/2015 Comorbid Cataracts, Chronic Obstructive Weeks Of Treatment: 54 History: Pulmonary Disease (COPD), Clustered Wound: No Osteoarthritis Photos Photo Uploaded By: Alejandro Mulling on 01/29/2017 10:18:58 Wound Measurements Length: (cm) 5 Width: (cm) 6.5 Depth: (cm) 0.5 Area: (cm) 25.525 Volume: (cm) 12.763 % Reduction in Area: -47.7% % Reduction in Volume: -146.2% Epithelialization: Small (1-33%) Tunneling: No Undermining: No Wound Description Full Thickness Without Exposed Foul Odor After Classification: Support Structures Slough/Fibrino Wound Margin: Indistinct, nonvisible Exudate Large Amount: Exudate Type: Purulent Exudate Color: yellow, brown, green Cleansing: No Yes Wound Bed Granulation Amount: None Present (0%) Exposed Structure Necrotic Amount: Large (67-100%) Fascia Exposed: No Tomb, Dewaine C. ([id number]) Necrotic Quality: Adherent Slough Fat Layer (Subcutaneous Tissue) Exposed: Yes Tendon Exposed: No Muscle Exposed: No Joint Exposed: No Bone Exposed: No Periwound Skin Texture Texture Color No Abnormalities Noted: No No Abnormalities Noted: No Erythema: Yes Moisture No Abnormalities Noted: No Maceration: Yes Wound Preparation Ulcer Cleansing: Other: soap and water, Topical Anesthetic Applied: None, Other: lidocaine 4%, Treatment Notes Wound #1 (Right, Lateral Malleolus) 1. Cleansed with: Clean wound with Normal Saline Cleanse wound with  antibacterial soap and water 2. Anesthetic Topical Lidocaine 4% cream to wound bed prior to debridement 3. Peri-wound Care: Barrier cream Moisturizing lotion 4. Dressing Applied: Iodoflex 5. Secondary Dressing Applied ABD Pad 7. Secured with Tape 3 Layer Compression System - Right Lower Extremity Notes unna to anchor, drawtex, xtrasorb Electronic Signature(s) Signed: 01/29/2017 5:03:58 PM By: Alejandro Mulling Entered By: Alejandro Mulling on 01/29/2017 08:16:55 [name] Stack ([id number]) -------------------------------------------------------------------------------- Wound Assessment Details Patient Name: [name] Stack Date of Service: 01/29/2017 8:00 AM Medical Record Number: [id number] Patient Account Number: [id number] Date of Birth/Sex: 03-Mar-1946 (71 y.o. Male) Treating RN: Ashok Cordia, [name] Primary Care Katricia Prehn: [name] Other Clinician: Referring Basia Mcginty: [name] Treating Braylea Brancato/Extender: [name] Weeks in Treatment: 54 Wound Status Wound Number: 2 Primary Venous Leg Ulcer Etiology: Wound Location: Right Malleolus - Medial Wound Open Wounding Event: Gradually Appeared Status: Date Acquired: 01/30/2016 Comorbid Cataracts, Chronic Obstructive Weeks Of Treatment: 52 History: Pulmonary Disease (COPD), Clustered Wound: Yes Osteoarthritis Photos Photo Uploaded By: Alejandro Mulling on 01/29/2017 10:18:59 Wound Measurements Length: (cm) 2.5 Width: (cm) 2.3 Depth: (cm) 0.2 Area: (cm) 4.516 Volume: (cm) 0.903 % Reduction in Area: 60.9% % Reduction in Volume: 21.8% Epithelialization: None Tunneling: No Undermining: No Wound Description Full Thickness Without Exposed Foul Odor After Classification: Support Structures Slough/Fibrino Wound Margin: Indistinct, nonvisible Exudate Large Amount: Exudate Type: Serous Exudate Color: amber Cleansing: No Yes Wound Bed Granulation Amount: Small (1-33%) Exposed Structure Granulation  Quality: Pink Fascia Exposed: No Ravan, Ayomide C. ([id number]) Necrotic Amount: Large (67-100%) Fat Layer (Subcutaneous Tissue) Exposed: Yes Necrotic Quality: Adherent Slough Tendon Exposed: No Muscle Exposed: No Joint Exposed: No Bone Exposed: No Periwound Skin Texture Texture Color No Abnormalities Noted: No No Abnormalities Noted: No Scarring: Yes Moisture No Abnormalities Noted: No Maceration: Yes Wound Preparation Ulcer Cleansing: Other: soap and water, Topical Anesthetic Applied: None, Other: Lidocaone 4%, Treatment Notes Wound #2 (Right, Medial Malleolus) 1. Cleansed with: Clean wound with Normal Saline Cleanse wound with antibacterial soap and water 2. Anesthetic Topical Lidocaine 4% cream to wound bed prior to debridement 3. Peri-wound Care: Barrier cream Moisturizing lotion 4. Dressing Applied: Iodoflex 5. Secondary Dressing Applied ABD Pad 7. Secured with Tape  3 Layer Compression System - Right Lower Extremity Notes unna to anchor, drawtex, xtrasorb Electronic Signature(s) Signed: 01/29/2017 5:03:58 PM By: Alejandro Mulling Entered By: Alejandro Mulling on 01/29/2017 08:17:35 [name] Stack ([id number]) -------------------------------------------------------------------------------- Vitals Details Patient Name: [name] Stack Date of Service: 01/29/2017 8:00 AM Medical Record Number: [id number] Patient Account Number: [id number] Date of Birth/Sex: 1945-12-23 (71 y.o. Male) Treating RN: Ashok Cordia, [name] Primary Care Arushi Partridge: [name] Other Clinician: Referring Makenzee Choudhry: [name] Treating Camara Rosander/Extender: [name] Weeks in Treatment: 54 Vital Signs Time Taken: 08:03 Temperature (F): 98.0 Height (in): 76 Pulse (bpm): 76 Weight (lbs): 238 Respiratory Rate (breaths/min): 18 Body Mass Index (BMI): 29 Blood Pressure (mmHg): 141/73 Reference Range: 80 - 120 mg / dl Electronic Signature(s) Signed: 01/29/2017 5:03:58 PM  By: Alejandro Mulling Entered By: Alejandro Mulling on 01/29/2017 08:04:03

## 2017-01-30 NOTE — Progress Notes (Signed)
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 Wound is quite extensive measuring 5. 4 x 4 by 0.3. They have been using some thermal wound product and sprayed that the obtained on the Internet for the last 5-6 monthsing much progress. This started as a small open wound that expanded. 01/24/16; the patient is been receiving Santyl changed daily by his wife. Continue debridement. Patient has no complaints 01/31/16; the patient arrives with irritation on the medial aspect of his ankle noticed by her intake nurse. The patient is noted pain in the area over the last day or 2. There are four new tiny wounds in this area. His co- pay for TheraSkin application is really high I think beyond her means 02/07/16; patient is improved C+S cultures MSSA completed Doxy. using iodoflex 02/15/16; patient arrived today with the wound and roughly the  same condition. Extensive area on the right lateral foot and ankle. Using Iodoflex. He came in last week with a cluster of new wounds on the medial aspect of the same ankle. 02/22/16; once again the patient complains of a lot of drainage coming out of this wound. We brought him back in on Friday for a dressing change has been using Iodoflex. States his pain level is better 02/29/16; still complaining of a lot of drainage even though we are putting absorbent material over the Santyl and bringing him back on Fridays for dressing changes. He is not complaining of pain. Her intake nurse notes blistering 03/07/16: pt returns today for f/u. he admits out in rain on Saturday and soaked his right leg. he did not share with his wife and he didn't notify the [facility]. he has an odor today that is c/w pseudomonas. Wound has greenish tan slough. there is no periwound erythema, induration, or fluctuance. wound has deteriorated since previous visit. denies fever, chills, body aches or malaise. no increased pain. 03/13/16: C+S showed proteus. He has not received AB'S. Switched to RTD last week. 03/27/16 patient is been using Iodoflex. Wound bed has improved and debridement is certainly easier 04/10/2016 -- he has been scheduled for a venous duplex study towards the end of the month 04/17/16; has been using silver alginate, states that the Iodoflex was hurting his wound and since  that is been changed he has had no pain unfortunately the surface of the wound continues to be unhealthy with Morency, Racer C. ([id number]) thick gelatinous slough and nonviable tissue. The wound will not heal like this. 04/20/2016 -- the patient was here for a nurse visit but I was asked to see the patient as the slough was quite significant and the nurse needed for clarification regarding the ointment to be used. 04/24/16; the patient's wounds on the right medial and right lateral ankle/malleolus both look a lot better today. Less adherent  slough healthier tissue. Dimensions better especially medially 05/01/16; the patient's wound surface continues to improve however he continues to require debridement switch her easier each week. Continue Santyl/Metahydrin mixture Hydrofera Blue next week. Still drainage on the medial aspect according to the intake nurse 05/08/16; still using Santyl and Medihoney. Still a lot of drainage per her intake nurse. Patient has no complaints pain fever chills etc. 05/15/16 switched the Hydrofera Blue last week. Dimensions down especially in the medial right leg wound. Area on the lateral which is more substantial also looks better still requires debridement 05/22/16; we have been using Hydrofera Blue. 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 Currently he tells me that the pain is doing very well about a 1 out of 10 today. Yesterday was a little bit worse but he tells me that he was more active watering his flowers that day. Overall he feels that his symptoms are doing significantly better at this point in time. His edema continues to be controlled well with the 4-layer compression wrap and he really has not noted any odor at this point in time. He is tolerating the dressing changes when they are performed well. 06/05/16 at this point in time today patient currently shows no interval signs or symptoms of local or systemic infection. Again his pain level he rates to be a 1 out of 10 at most and overall he tells me that generally this is not giving him much trouble. In fact he even feels maybe a little bit better than last week. We have continue with the 4-layer compression wrap in which she tolerates very well at this point. He is continuing to utilize the [place]. 06/12/16 I think there has been some progression in the  status of both of these wounds over today again covered in a gelatinous surface. Has been using Hydrofera Blue. We had used Iodoflex in the past I'm not sure if there was an issue other than changing to something that might progress towards closure faster 06/19/16; he did not tolerate the Flexeril last week secondary to pain and this was changed on Friday back to [facility] area he continues to have copious amounts of gelatinous surface slough which is think inhibiting the speed of healing this area 06/26/16 patient over the last week has utilized the Santyl to try to loosen up some of the tightly adherent slough that was noted on evaluation last week. The good news is he tells me that the medial malleoli region really does not bother him the right llateral malleoli region is more tender to palpation at this point in time especially in the central/inferior location. However it does appear that the Santyl has done his job to loosen up the adherent slough at this point in time. Fortunately he has no interval signs  or symptoms of infection locally or systemically no purulent discharge noted. 07/03/16 at this point in time today patient's wounds appear to be significantly improved over the right medial and lateral malleolus locations. He has much less tenderness at this point in time and the wounds appear clean her although there is still adherent slough this is sufficiently improved over what I saw last week. I still see no evidence of local infection. 07/10/16; continued gradual improvement in the right medial and lateral malleolus locations. The lateral is more substantial wound now divided into 2 by a rim of normal epithelialization. 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 He denies any complaints of pain or intolerance to compression. Both ulcers are smaller in size  oriented today's measurements, both are covered with a softly adherent slough. 07/24/16; medial wound is smaller, lateral about the same although surface looks better. Still using DOC, MANDALA ([id number]) Hydrofera Blue 07/31/16; arrives today complaining of pain in the lateral part of his foot. Nurse reports a lot more drainage. He has been using Hydrofera Blue. Switch to silver alginate today 08/03/2016 -- I was asked to see the patient was here for a nurse visit today. I understand he had a lot of pain in his right lower extremity and was having blisters on his right foot which have not been there before. Though he started on doxycycline he does not have blisters elsewhere on his body. I do not believe this is a drug allergy. also mentioned that there was a copious purulent discharge from the wound and clinically there is no evidence of cellulitis. 08/07/16; I note that the patient came in for his nurse check on Friday apparently with blisters on his toes on the right than a lot of swelling in his forefoot. He continued on the doxycycline that I had prescribed on 12/8. A culture was done of the lateral wound that showed a combination of a few Proteus and Pseudomonas. Doxycycline might of covered the Proteus but would be unlikely to cover the Pseudomonas. He is on Coumadin. He arrives in the clinic today feeling a lot better states the pain is a lot better but nothing specific really was done other than to rewrap the foot also noted that he had arterial studies ordered in August although these were never done. It is reasonable to go ahead and reorder these. 08/14/16; generally arrives in a better state today in terms of the wounds he has taken cefdinir for one week. Our intake nurse reports copious amounts of drainage but the patient is complaining of much less pain. He is not had his PT and INR checked and I've asked him to do this today or tomorrow. 08/24/2016 -- patient arrives  today after 10 days and said he had a stomach upset. His arterial study was done and I have reviewed this report and find it to be within normal limits. However I did not note any venous duplex studies for reflux, and Dr. Leanord Hawking may have ordered these in the past but I will leave it to him to decide if he needs these. The patient has finished his course of cefdinir. 08/28/16; patient arrives today again with copious amounts of thick really green drainage for our intake nurse. He states he has a very tender spot at the superior part of the lateral wound. Wounds are larger 09/04/16; no real change in the condition of this patient's  wound still copious amounts of surface slough. Started him on Iodoflex last week he is completing another course of Cefdinir or which I think was done empirically. His arterial study showed ABIs were 1.1 on the right 1.5 on the left. He did have a slightly reduced ABI in the right the left one was not obtained. Had calcification of the right posterior tibial artery. The interpretation was no segmental stenosis. His waveforms were triphasic. His reflux studies are later this month. Depending on this I'll send him for a vascular consultation, he may need to see plastic surgery as I believe he is had plastic surgery on this foot in the past. He had an injury to the foot in the 1980s. 1/16 /18 right lateral greater than right medial ankle wounds on the right in the setting of previous skin grafting. 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 He does not have arterial issues. Each week he comes in with the same adherent surface slough although there was less of this today 09/18/16; right lateral greater than right medial lower extremity wounds in the setting of previous skin grafting and trauma. He has least to vein laser ablation scheduled for February 2 for venous reflux. He does not have  significant arterial disease. Problem has been very difficult to handle surface slough/necrotic tissue. Recently using Iodoflex for this with some, albeit slow improvement 09/25/16; right lateral greater than right medial lower extremity venous wounds in the setting of previous skin grafting. He is going for ablation surgery on February 2 after this he'll come back here for rewrap. He has been using Iodoflex as the primary dressing. 10/02/16; right lateral greater than right medial lower extremity wounds in the setting of previous skin grafting. He had his ablation surgery last week, I don't have a report. He tolerated this well. Came in with a thigh- high Unna boots on Friday. We have been using Iodoflex as the primary dressing. His measurements are improving 10/09/16; continues to make nice aggressive in terms of the wounds on his lateral and medial right ankle in the setting of previous skin grafting. Yesterday he noticed drainage at one of his surgical sites from his TEO, MOEDE. ([id number]) venous ablation on the right calf. He took off the bandage over this area felt a "popping" sensation and a reddish-brown drainage. He is not complaining of any pain 10/16/16; he continues to make nice progression in terms of the wounds on the lateral and medial malleolus. Both smaller using Iodoflex. He had a surgical area in his posterior mid calf we have been using iodoform. All the wounds are down and dimensions 10/23/16; the patient arrives today with no complaints. He states the Iodoflex is a bit uncomfortable. He is not systemically unwell. We have been using Iodoflex to the lateral right ankle and the medial and Aquacel Ag to the reflux surgical wound on the posterior right calf. All of these wounds are doing well 10/30/16; patient states he has no pain no systemic symptoms. I changed him to [facility] last week. Although the wounds are doing well 11/06/16; patient reports no pain or systemic  symptoms. We continue with Hydrofera Blue. Both wound areas on the medial and lateral ankle appear to be doing well with improvement and dimensions and improvement in the wound bed. 11/13/16; patient's dimensions continued to improve. We continue with Hydrofera Blue on the medial and lateral side.  Appear to be doing well with healthy granulation and advancing epithelialization 11/20/16; patient's dimensions improving laterally by about half a centimeter in length. Otherwise no change on the medial side. [facility] 12/[facility]16; no major change in patient's wound dimensions. Intake nurse reports more drainage. 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 He is voicing no complaints or concerns. He has been tolerating his twice weekly compression therapy changes 12/11/16 Patient complains of pain and increased drainage.. wants hydrofera blue 12/18/16 improvement. Sorbact 12/25/16; medial wound is smaller, lateral measures the same. Still on sorbact 01/01/17; medial wound continues to be smaller, lateral measures about the same however there is clearly advancing epithelialization here as well in fact I think the wound will ultimately divided into 2 open areas 01/08/17; unfortunately today fairly significant regression in several areas. Surface of the lateral wound covered again in adherent necrotic material which is difficult to debridement. He has significant surrounding skin maceration. The expanding area of tissue epithelialization in the middle of the wound that was encouraging last week appears to be smaller. There is no surrounding tenderness. The area on the medial leg also did not seem to be as healthy as last week, the reason for this regression this week is not totally clear. We have been using Sorbact for the last 4 weeks. We'll switched of polymen AG which we will order via home  medical supply. If there is a problem with this would switch back to Iodoflex 01/15/18; drainage,odor. No change. Switched to polymen last week 01/22/17; still continuous drainage. Culture I did last week showed a few Proteus pansensitive. I did this culture because of drainage. Put him on Augmentin which she has been taking since Saturday however he is developed 4-5 liquid bowel movements. He is also on Coumadin. Beyond this wound is not changed at all, still nonviable necrotic surface material which I debrided reveals healthy granulation line 01/29/17; still copious amounts of drainage reported by her intake nurse. Wound measuring slightly smaller. Currently fact on Iodoflex although I'm looking forward to changing back to perhaps [facility] or polymen AG Objective Girdler, Coda C. ([id number]) Constitutional Sitting or standing Blood Pressure is within target range for patient.. Pulse regular and within target range for patient.Marland Kitchen. Respirations regular, non-labored and within target range.. Temperature is normal and within the target range for the patient.Marland Kitchen. appears in no distress. Vitals Time Taken: 8:03 AM, Height: 76 in, Weight: 238 lbs, BMI: 29, Temperature: 98.0 F, Pulse: 76 bpm, Respiratory Rate: 18 breaths/min, Blood Pressure: 141/73 mmHg. Eyes Conjunctivae clear. No discharge. Respiratory Respiratory effort is easy and symmetric bilaterally. Rate is normal at rest and on room air.. Cardiovascular Peripheral pulses are robust on the right. Chronic venous insufficiency however the edema is controlled. Lymphatic None palpable in the popliteal or inguinal area. Psychiatric No evidence of depression, anxiety, or agitation. Calm, cooperative, and communicative. Appropriate interactions and affect.. General Notes: When exam; perhaps slightly smaller and a better-looking surface on the Iodoflex. No debridement is required today and I'm hopeful the Iodoflex will make that  unnecessary. There is no evidence of surrounding infection. His edema is well-controlled peripheral pulses are robust Integumentary (Hair, Skin) Previous scar tissue from trauma in the same wound area.. Wound #1 status is Open. Original cause of wound was Gradually Appeared. The wound is located on the Right,Lateral Malleolus. The wound measures 5cm length x 6.5cm width x 0.5cm depth; 25.525cm^2  area and 12.763cm^3 volume. There is Fat Layer (Subcutaneous Tissue) Exposed exposed. There is no tunneling or undermining noted. There is a large amount of purulent drainage noted. The wound margin is indistinct and nonvisible. There is no granulation within the wound bed. There is a large (67-100%) amount of necrotic tissue within the wound bed including Adherent Slough. The periwound skin appearance exhibited: Maceration, Erythema. The surrounding wound skin color is noted with erythema. Wound #2 status is Open. Original cause of wound was Gradually Appeared. The wound is located on the Right,Medial Malleolus. The wound measures 2.5cm length x 2.3cm width x 0.2cm depth; 4.516cm^2 area and 0.903cm^3 volume. There is Fat Layer (Subcutaneous Tissue) Exposed exposed. There is no tunneling or undermining noted. There is a large amount of serous drainage noted. The wound margin is indistinct and nonvisible. There is small (1-33%) pink granulation within the wound bed. There is a large (67-100%) amount of necrotic tissue within the wound bed including Adherent Slough. The periwound skin appearance exhibited: Scarring, Maceration. 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 No tub bath. Wound #2 Right,Medial Malleolus: Cleanse wound with mild soap and water May shower with protection. No tub bath. Anesthetic: Wound #1 Right,Lateral Malleolus: Topical Lidocaine 4% cream applied to wound bed prior to debridement Wound #2 Right,Medial Malleolus: Topical Lidocaine 4% cream applied to wound bed prior to debridement Skin Barriers/Peri-Wound Care: Wound #1 Right,Lateral Malleolus: Barrier cream - zinc oxide Triamcinolone Acetonide Ointment Primary Wound Dressing: Wound #1 Right,Lateral Malleolus: Iodoflex Wound #2 Right,Medial Malleolus: Iodoflex Secondary Dressing: Wound #1 Right,Lateral Malleolus: ABD pad JAEVIAN, SHEAN. ([id number]) Wound #2 Right,Medial Malleolus: ABD pad Drawtex Drawtex Dressing Change Frequency: Wound #1 Right,Lateral Malleolus: Change dressing every week Other: - nurse visit Friday Wound #2 Right,Medial Malleolus: Change dressing every week Other: - nurse visit Friday Follow-up Appointments: Wound #1 Right,Lateral Malleolus: Return Appointment in 1 week. Other: - nurse visit Friday Wound #2 Right,Medial Malleolus: Return Appointment in 1 week. Other: - nurse visit Friday Edema Control: Wound #1 Right,Lateral Malleolus: 3 Layer Compression System - Right Lower Extremity - UNNA TO ANCHOR Elevate legs to the level of the heart and pump ankles as often as possible Wound #2 Right,Medial Malleolus: 3 Layer Compression System - Right Lower Extremity - UNNA TO ANCHOR Elevate legs to the level of the heart and pump ankles as often as  possible Additional Orders / Instructions: Wound #1 Right,Lateral Malleolus: Increase protein intake. OK to return to work with the following restrictions: Activity as tolerated Wound #2 Right,Medial Malleolus: Increase protein intake. OK to return to work with the following restrictions: Activity as tolerated #1 I'm going to continue with the Iodoflex to the right lateral and right medial wounds for another week or 2 then I'm going to consider changing to either Hydrofera Blue or polymen AG #2 still a lot of drainage reported by her intake nurse although there is not appear to be evidence  of infection. Skin maceration looks satisfactory today. We are using a lot in terms of secondary dressings including zinc around the wound circumference, Drawtex, ABDs Electronic Signature(s) SOLAN, VOSLER ([id number]) Signed: 01/29/2017 5:27:39 PM By: [name] MD Entered By: [name] on 01/29/2017 08:31:28 [name] ([id number]) -------------------------------------------------------------------------------- SuperBill Details [name] Date of Service: 01/29/2017 Patient Name: C. Patient Account Number: [id number] Medical Record Treating RN: [name] [id number] Number: Other Clinician: Date of Birth/Sex: 22-Sep-1945 (71 y.o. 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, initial T85.613A encounter Disruption of external operation (surgical) wound, not elsewhere classified,  subsequent T81.31XD encounter L97.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 of skin Facility Procedures CPT4: Description Modifier Quantity Code [id number] (Facility Use Only) [id number] - APPLY MULTLAY COMPRS LWR RT 1 LEG Physician Procedures CPT4: Description Modifier Quantity Code [id number] - WC PHYS LEVEL 3 - EST PT 1 ICD-10 Description Diagnosis I87.331 Chronic venous hypertension (idiopathic) with ulcer and inflammation of right lower extremity L97.212 Non-pressure chronic ulcer of  right calf with fat layer exposed Electronic Signature(s) Signed: 01/29/2017 5:03:58 PM By: Alejandro Mulling Signed: 01/29/2017 5:27:39 PM By: [name] MD Entered By: Alejandro Mulling on 01/29/2017 08:45:09

## 2017-02-01 DIAGNOSIS — I87331 Chronic venous hypertension (idiopathic) with ulcer and inflammation of right lower extremity: Secondary | ICD-10-CM | POA: Diagnosis not present

## 2017-02-03 NOTE — Progress Notes (Signed)
[name], [name] ([id number]) Visit Report for 02/01/2017 Arrival Information Details Patient Name: [name], [name] Date of Service: 02/01/2017 8:00 AM Medical Record Number: [id number] Patient Account Number: [id number] Date of Birth/Sex: 02-Apr-1946 [age] y.o. Male) Treating RN: Phillis HaggisPinkerton, [name] Primary Care Dameisha Tschida: [name], [name] Other Clinician: Referring Zyra Parrillo: [name], [name] Treating Kyona Chauncey/Extender: Rudene ReBritto, Errol Weeks in Treatment: 2954 Visit Information History Since Last Visit All ordered tests and consults were completed: No Patient Arrived: Ambulatory Added or deleted any medications: No Arrival Time: 08:04 Any new allergies or adverse reactions: No Accompanied By: self Had a fall or experienced change in No Transfer Assistance: None activities of daily living that may affect Patient Identification Verified: Yes risk of falls: Secondary Verification Process Yes Signs or symptoms of abuse/neglect since last No Completed: visito Patient Requires Transmission-Based No Hospitalized since last visit: No Precautions: Has Dressing in Place as Prescribed: Yes Patient Has Alerts: No Has Compression in Place as Prescribed: Yes Pain Present Now: No Electronic Signature(s) Signed: 02/01/2017 4:22:44 PM By: Alejandro MullingPinkerton, Debra Entered By: Alejandro MullingPinkerton, Debra on 02/01/2017 08:04:31 [name], [name] ([id number]) -------------------------------------------------------------------------------- Encounter Discharge Information Details Patient Name: [name], [name] Date of Service: 02/01/2017 8:00 AM Medical Record Number: [id number] Patient Account Number: [id number] Date of Birth/Sex: 02-Apr-1946 [age] y.o. Male) Treating RN: Phillis HaggisPinkerton, [name] Primary Care Willette Mudry: [name], [name] Other Clinician: Referring Rebekha Diveley: [name], [name] Treating Daniyla Pfahler/Extender: Rudene ReBritto, Errol Weeks in Treatment: 3354 Encounter Discharge Information Items Discharge Pain Level: 0 Discharge Condition:  Stable Ambulatory Status: Ambulatory Discharge Destination: Home Private Transportation: Auto Accompanied By: self Schedule Follow-up Appointment: Yes Medication Reconciliation completed and No provided to Patient/Care Carren Blakley: Clinical Summary of Care: Electronic Signature(s) Signed: 02/01/2017 4:22:44 PM By: Alejandro MullingPinkerton, Debra Entered By: Alejandro MullingPinkerton, Debra on 02/01/2017 08:06:59 [name], [name] ([id number]) -------------------------------------------------------------------------------- Patient/Caregiver Education Details Patient Name: [name], [name] Date of Service: 02/01/2017 8:00 AM Medical Record Number: [id number] Patient Account Number: [id number] Date of Birth/Gender: 02-Apr-1946 [age] y.o. Male) Treating RN: Phillis HaggisPinkerton, [name] Primary Care Physician: [name], [name] Other Clinician: Referring Physician: Darreld [name], [name] Treating Physician/Extender: Rudene ReBritto, Errol Weeks in Treatment: 3854 Education Assessment Education Provided To: Patient Education Topics Provided Wound/Skin Impairment: Handouts: Other: do not get wrap wet Methods: Demonstration, Explain/Verbal Responses: State content correctly Electronic Signature(s) Signed: 02/01/2017 4:22:44 PM By: Alejandro MullingPinkerton, Debra Entered By: Alejandro MullingPinkerton, Debra on 02/01/2017 08:06:45 [name], [name] ([id number]) -------------------------------------------------------------------------------- Wound Assessment Details Patient Name: [name], [name] Date of Service: 02/01/2017 8:00 AM Medical Record Number: [id number] Patient Account Number: [id number] Date of Birth/Sex: 02-Apr-1946 [age] y.o. Male) Treating RN: Ashok CordiaPinkerton, [name] Primary Care Rosaleigh Brazzel: [name], [name] Other Clinician: Referring Herron Fero: [name], [name] Treating Jayshaun Phillips/Extender: Rudene ReBritto, Errol Weeks in Treatment: 54 Wound Status Wound Number: 1 Primary Venous Leg Ulcer Etiology: Wound Location: Right Malleolus - Lateral Wound Open Wounding Event: Gradually  Appeared Status: Date Acquired: 08/11/2015 Comorbid Cataracts, Chronic Obstructive Weeks Of Treatment: 54 History: Pulmonary Disease (COPD), Clustered Wound: No Osteoarthritis Wound Measurements Length: (cm) 5 Width: (cm) 6.5 Depth: (cm) 0.5 Area: (cm) 25.525 Volume: (cm) 12.763 % Reduction in Area: -47.7% % Reduction in Volume: -146.2% Epithelialization: Small (1-33%) Tunneling: No Undermining: No Wound Description Full Thickness Without Exposed Classification: Support Structures Wound Margin: Indistinct, nonvisible Exudate Large Amount: Exudate Type: Purulent Exudate Color: yellow, brown, green Foul Odor After Cleansing: No Slough/Fibrino Yes Wound Bed Granulation Amount: None Present (0%) Exposed Structure Necrotic Amount: Large (67-100%) Fascia Exposed: No Necrotic Quality: Adherent Slough Fat Layer (Subcutaneous Tissue) Exposed: Yes Tendon Exposed: No Muscle Exposed: No Joint Exposed: No Bone Exposed: No Periwound  Skin Texture Texture Color No Abnormalities Noted: No No Abnormalities Noted: No Erythema: Yes Moisture No Abnormalities Noted: No [name], [name] Maceration: Yes Wound Preparation Ulcer Cleansing: Other: soap and water, Topical Anesthetic Applied: None, Other: lidocaine 4%, Treatment Notes Wound #1 (Right, Lateral Malleolus) 1. Cleansed with: Clean wound with Normal Saline Cleanse wound with antibacterial soap and water 3. Peri-wound Care: Barrier cream Moisturizing lotion 4. Dressing Applied: Iodoflex 5. Secondary Dressing Applied ABD Pad Dry Gauze 7. Secured with Tape 3 Layer Compression System - Right Lower Extremity Notes drawtex, xtrasorb, unna to Ecologist) Signed: 02/01/2017 4:22:44 PM By: Alejandro Mulling Entered By: Alejandro Mulling on 02/01/2017 08:05:10 [name] Stack ([id number]) -------------------------------------------------------------------------------- Wound  Assessment Details Patient Name: [name] Stack Date of Service: 02/01/2017 8:00 AM Medical Record Number: [id number] Patient Account Number: [id number] Date of Birth/Sex: 1945/10/11 (71 y.o. Male) Treating RN: Ashok Cordia, [name] Primary Care Kayline Sheer: [name] Mclean Other Clinician: Referring Ninel Abdella: [name] Mclean Treating Adriauna Campton/Extender: Rudene Re in Treatment: 54 Wound Status Wound Number: 2 Primary Venous Leg Ulcer Etiology: Wound Location: Right Malleolus - Medial Wound Open Wounding Event: Gradually Appeared Status: Date Acquired: 01/30/2016 Comorbid Cataracts, Chronic Obstructive Weeks Of Treatment: 52 History: Pulmonary Disease (COPD), Clustered Wound: Yes Osteoarthritis Wound Measurements Length: (cm) 2.5 Width: (cm) 2.3 Depth: (cm) 0.2 Area: (cm) 4.516 Volume: (cm) 0.903 % Reduction in Area: 60.9% % Reduction in Volume: 21.8% Epithelialization: None Tunneling: No Undermining: No Wound Description Full Thickness Without Exposed Foul Odor After Classification: Support Structures Slough/Fibrino Wound Margin: Indistinct, nonvisible Exudate Large Amount: Exudate Type: Serous Exudate Color: amber Cleansing: No Yes Wound Bed Granulation Amount: Small (1-33%) Exposed Structure Granulation Quality: Pink Fascia Exposed: No Necrotic Amount: Large (67-100%) Fat Layer (Subcutaneous Tissue) Exposed: Yes Necrotic Quality: Adherent Slough Tendon Exposed: No Muscle Exposed: No Joint Exposed: No Bone Exposed: No Periwound Skin Texture Texture Color No Abnormalities Noted: No No Abnormalities Noted: No Scarring: Yes Moisture Haskew, [name] ([id number]) No Abnormalities Noted: No Maceration: Yes Wound Preparation Ulcer Cleansing: Other: soap and water, Topical Anesthetic Applied: None, Other: Lidocaone 4%, Treatment Notes Wound #2 (Right, Medial Malleolus) 1. Cleansed with: Clean wound with Normal Saline Cleanse wound with  antibacterial soap and water 3. Peri-wound Care: Barrier cream Moisturizing lotion 4. Dressing Applied: Iodoflex 5. Secondary Dressing Applied ABD Pad Dry Gauze 7. Secured with Tape 3 Layer Compression System - Right Lower Extremity Notes drawtex, xtrasorb, unna to Ecologist) Signed: 02/01/2017 4:22:44 PM By: Alejandro Mulling Entered By: Alejandro Mulling on 02/01/2017 08:05:32

## 2017-02-05 ENCOUNTER — Encounter: Payer: Medicare HMO | Admitting: Internal Medicine

## 2017-02-05 DIAGNOSIS — I87331 Chronic venous hypertension (idiopathic) with ulcer and inflammation of right lower extremity: Secondary | ICD-10-CM | POA: Diagnosis not present

## 2017-02-06 NOTE — Progress Notes (Signed)
ROBERTH, BERLING ([id number]) Visit Report for 02/05/2017 Arrival Information Details Patient Name: [name], [name]. Date of Service: 02/05/2017 8:00 AM Medical Record Number: [id number] Patient Account Number: [id number] Date of Birth/Sex: 1945/11/19 (71 y.o. Male) Treating RN: [name] Primary Care Latriece Anstine: [name] Other Clinician: Referring Gladstone Rosas: [name] Treating Haifa Hatton/Extender: Altamese [place] in Treatment: 55 Visit Information History Since Last Visit All ordered tests and consults were completed: No Patient Arrived: Ambulatory Added or deleted any medications: No Arrival Time: 08:01 Any new allergies or adverse reactions: No Accompanied By: self Had a fall or experienced change in No Transfer Assistance: None activities of daily living that may affect Patient Identification Verified: Yes risk of falls: Secondary Verification Process Yes Signs or symptoms of abuse/neglect since last No Completed: visito Patient Requires Transmission-Based No Hospitalized since last visit: No Precautions: Has Dressing in Place as Prescribed: Yes Patient Has Alerts: No Has Compression in Place as Prescribed: Yes Pain Present Now: No Electronic Signature(s) Signed: 02/05/2017 5:04:17 PM By: Alejandro Mulling Entered By: Alejandro Mulling on 02/05/2017 08:06:26 [name] ([id number]) -------------------------------------------------------------------------------- Encounter Discharge Information Details Patient Name: [name] Date of Service: 02/05/2017 8:00 AM Medical Record Number: [id number] Patient Account Number: [id number] Date of Birth/Sex: 03-Jun-1946 (71 y.o. Male) Treating RN: [name] Primary Care Teauna Dubach: [name] Other Clinician: Referring Saanvika Vazques: [name] Treating Jeanette Rauth/Extender: [name] Weeks in Treatment: 58 Encounter Discharge Information Items Discharge Pain Level: 0 Discharge  Condition: Stable Ambulatory Status: Ambulatory Discharge Destination: Home Transportation: Private Auto Accompanied By: self Schedule Follow-up Appointment: Yes Medication Reconciliation completed No and provided to Patient/Care Yazid Pop: Patient Clinical Summary of Care: Declined Electronic Signature(s) Signed: 02/05/2017 8:56:22 AM By: Gwenlyn Perking Entered By: Gwenlyn Perking on 02/05/2017 08:56:22 [name] ([id number]) -------------------------------------------------------------------------------- Lower Extremity Assessment Details Patient Name: [name] Date of Service: 02/05/2017 8:00 AM Medical Record Number: [id number] Patient Account Number: [id number] Date of Birth/Sex: 1945/12/25 (72 y.o. Male) Treating RN: [name] Primary Care Yolande Skoda: [name] Other Clinician: Referring Dianely Krehbiel: [name] Treating Jaysten Essner/Extender: [name] Weeks in Treatment: 55 Vascular Assessment Pulses: Dorsalis Pedis Palpable: [Right:Yes] Posterior Tibial Extremity colors, hair growth, and conditions: Extremity Color: [Right:Normal] Temperature of Extremity: [Right:Warm] Capillary Refill: [Right:< 3 seconds] Toe Nail Assessment Left: Right: Thick: Yes Discolored: Yes Deformed: No Improper Length and Hygiene: Yes Electronic Signature(s) Signed: 02/05/2017 5:04:17 PM By: Alejandro Mulling Entered By: Alejandro Mulling on 02/05/2017 08:18:46 [name] ([id number]) -------------------------------------------------------------------------------- Multi Wound Chart Details Patient Name: [name] Date of Service: 02/05/2017 8:00 AM Medical Record Number: [id number] Patient Account Number: [id number] Date of Birth/Sex: 1945/09/24 (71 y.o. Male) Treating RN: Ashok Cordia, [name] Primary Care Javell Blackburn: [name] Other Clinician: Referring Baylen Dea: [name] Treating Nadra Hritz/Extender: [name] Weeks in Treatment:  55 Vital Signs Height(in): 76 Pulse(bpm): 66 Weight(lbs): 238 Blood Pressure 144/58 (mmHg): Body Mass Index(BMI): 29 Temperature(F): 97.8 Respiratory Rate 18 (breaths/min): Photos: [1:No Photos] [2:No Photos] [N/A:N/A] Wound Location: [1:Right Malleolus - Lateral] [2:Right Malleolus - Medial] [N/A:N/A] Wounding Event: [1:Gradually Appeared] [2:Gradually Appeared] [N/A:N/A] Primary Etiology: [1:Venous Leg Ulcer] [2:Venous Leg Ulcer] [N/A:N/A] Comorbid History: [1:Cataracts, Chronic Obstructive Pulmonary Disease (COPD), Osteoarthritis] [2:Cataracts, Chronic Obstructive Pulmonary Disease (COPD), Osteoarthritis] [N/A:N/A] Date Acquired: [1:08/11/2015] [2:01/30/2016] [N/A:N/A] Weeks of Treatment: [1:55] [2:53] [N/A:N/A] Wound Status: [1:Open] [2:Open] [N/A:N/A] Clustered Wound: [1:No] [2:Yes] [N/A:N/A] Measurements L x W x D 6x6x0.3 [2:2.6x2.5x0.2] [N/A:N/A] (cm) Area (cm) : [1:28.274] [2:5.105] [N/A:N/A] Volume (cm) : [1:8.482] [2:1.021] [N/A:N/A] % Reduction in Area: [1:-63.60%] [  2:55.80%] [N/A:N/A] % Reduction in Volume: -63.60% [2:11.60%] [N/A:N/A] Classification: [1:Full Thickness Without Exposed Support Structures] [2:Full Thickness Without Exposed Support Structures] [N/A:N/A] Exudate Amount: [1:Large] [2:Large] [N/A:N/A] Exudate Type: [1:Purulent] [2:Serous] [N/A:N/A] Exudate Color: [1:yellow, brown, green] [2:amber] [N/A:N/A] Wound Margin: [1:Indistinct, nonvisible] [2:Indistinct, nonvisible] [N/A:N/A] Granulation Amount: [1:None Present (0%)] [2:Medium (34-66%)] [N/A:N/A] Granulation Quality: [1:N/A] [2:Pink] [N/A:N/A] Necrotic Amount: [1:Large (67-100%)] [2:Medium (34-66%)] [N/A:N/A] Exposed Structures: [1:Fat Layer (Subcutaneous Tissue) Exposed: Yes] [2:Fat Layer (Subcutaneous Tissue) Exposed: Yes] [N/A:N/A] Fascia: No Fascia: No Tendon: No Tendon: No Muscle: No Muscle: No Joint: No Joint: No Bone: No Bone: No Epithelialization: Small (1-33%) None  N/A Debridement: Debridement ([id number](11042- N/A N/A [id number]) Pre-procedure 08:35 N/A N/A Verification/Time Out Taken: Pain Control: Lidocaine 4% Topical N/A N/A Solution Tissue Debrided: Fibrin/Slough, Exudates, N/A N/A Subcutaneous Level: Skin/Subcutaneous N/A N/A Tissue Debridement Area (sq 36 N/A N/A cm): Instrument: Curette N/A N/A Bleeding: Minimum N/A N/A Hemostasis Achieved: Pressure N/A N/A Procedural Pain: 0 N/A N/A Post Procedural Pain: 0 N/A N/A Debridement Treatment Procedure was tolerated N/A N/A Response: well Post Debridement 6x6x0.3 N/A N/A Measurements L x W x D (cm) Post Debridement 8.482 N/A N/A Volume: (cm) Periwound Skin Texture: No Abnormalities Noted Scarring: Yes N/A Periwound Skin Maceration: Yes Maceration: Yes N/A Moisture: Periwound Skin Color: Erythema: Yes No Abnormalities Noted N/A Temperature: No Abnormality No Abnormality N/A Tenderness on Yes Yes N/A Palpation: Wound Preparation: Ulcer Cleansing: Other: Ulcer Cleansing: Other: N/A soap and water soap and water Topical Anesthetic Topical Anesthetic Applied: Other: lidocaine Applied: Other: Lidocaone 4% 4% Procedures Performed: Debridement N/A N/A Treatment Notes Wound #1 (Right, Lateral Malleolus) 1. Cleansed with: [name] StackBLACKWELL, Dyshawn C. ([id number]) Clean wound with Normal Saline Cleanse wound with antibacterial soap and water 2. Anesthetic Topical Lidocaine 4% cream to wound bed prior to debridement 3. Peri-wound Care: Barrier cream 4. Dressing Applied: Hydrafera Blue 5. Secondary Dressing Applied ABD Pad 7. Secured with Tape 3 Layer Compression System - Right Lower Extremity Notes unna to anchor, drawtex, xtrasorb Wound #2 (Right, Medial Malleolus) 1. Cleansed with: Clean wound with Normal Saline Cleanse wound with antibacterial soap and water 2. Anesthetic Topical Lidocaine 4% cream to wound bed prior to debridement 3. Peri-wound Care: Barrier cream 4. Dressing  Applied: Hydrafera Blue 5. Secondary Dressing Applied ABD Pad 7. Secured with Tape 3 Layer Compression System - Right Lower Extremity Notes unna to anchor, drawtex, xtrasorb Electronic Signature(s) Signed: 02/05/2017 6:01:27 PM By: Baltazar Najjarobson, [name] MD Entered By: Baltazar Najjarobson, [name] on 02/05/2017 82:95:6209:28:28 [name] StackBLACKWELL, Laksh C. ([id number]) -------------------------------------------------------------------------------- Multi-Disciplinary Care Plan Details Patient Name: [name] StackBLACKWELL, Tally C. Date of Service: 02/05/2017 8:00 AM Medical Record Number: [id number] Patient Account Number: [id number] Date of Birth/Sex: 1945/09/02 52(70 y.o. Male) Treating RN: Phillis HaggisPinkerton, [name] Primary Care Jemimah Cressy: Darreld McleanMILES, LINDA Other Clinician: Referring Jahzier Villalon: Darreld McleanMILES, LINDA Treating Malone Vanblarcom/Extender: Maxwell CaulOBSON, [name] G Weeks in Treatment: 55 Active Inactive ` Venous Leg Ulcer Nursing Diagnoses: Knowledge deficit related to disease process and management Goals: Patient will maintain optimal edema control Date Initiated: 04/03/2016 Target Resolution Date: 11/24/2016 Goal Status: Active Interventions: Compression as ordered Treatment Activities: Therapeutic compression applied : 04/03/2016 Notes: ` Wound/Skin Impairment Nursing Diagnoses: Impaired tissue integrity Goals: Ulcer/skin breakdown will heal within 14 weeks Date Initiated: 01/17/2016 Target Resolution Date: 11/24/2016 Goal Status: Active Interventions: Assess ulceration(s) every visit Notes: Electronic Signature(s) Signed: 02/05/2017 5:04:17 PM By: Aurelio JewPinkerton, Debra Co, [name] ([id number]) Entered By: Alejandro MullingPinkerton, Debra on 02/05/2017 08:18:56 [name] StackBLACKWELL, Jeromy C. ([id number]) -------------------------------------------------------------------------------- Pain Assessment Details Patient Name: [name] StackBLACKWELL, Kimo C. Date of Service: 02/05/2017 8:00  AM Medical Record Number: [id number] Patient Account Number: [id number] Date of Birth/Sex:  12/25/1945 (71 y.o. Male) Treating RN: Ashok Cordia, [name] Primary Care Arieana Somoza: [name] Other Clinician: Referring Kayanna Mckillop: [name] Treating Franklyn Cafaro/Extender: [name] Weeks in Treatment: 55 Active Problems Location of Pain Severity and Description of Pain Patient Has Paino No Site Locations With Dressing Change: No Pain Management and Medication Current Pain Management: Electronic Signature(s) Signed: 02/05/2017 5:04:17 PM By: Alejandro Mulling Entered By: Alejandro Mulling on 02/05/2017 08:06:34 [name] ([id number]) -------------------------------------------------------------------------------- Patient/Caregiver Education Details [name] Date of Service: 02/05/2017 8:00 AM Patient Name: C. Patient Account Number: [id number] Medical Record Treating RN: [name] [id number] Number: Other Clinician: Date of Birth/Gender: 09-11-1945 (71 y.o. Male) Treating [name], [name] Primary Care Physician: [name] Physician/Extender: G Referring Physician: [name] in Treatment: 23 Education Assessment Education Provided To: Patient Education Topics Provided Wound/Skin Impairment: Handouts: Other: change dressing as ordered Methods: Demonstration, Explain/Verbal Responses: State content correctly Electronic Signature(s) Signed: 02/05/2017 5:04:17 PM By: Alejandro Mulling Entered By: Alejandro Mulling on 02/05/2017 08:21:56 [name] ([id number]) -------------------------------------------------------------------------------- Wound Assessment Details Patient Name: [name] Date of Service: 02/05/2017 8:00 AM Medical Record Number: [id number] Patient Account Number: [id number] Date of Birth/Sex: [birth year] (71 y.o. Male) Treating RN: Ashok Cordia, [name] Primary Care Margy Sumler: [name] Other Clinician: Referring Tecla Mailloux: [name] Treating Sullivan Blasing/Extender: [name] Weeks in Treatment:  55 Wound Status Wound Number: 1 Primary Venous Leg Ulcer Etiology: Wound Location: Right Malleolus - Lateral Wound Open Wounding Event: Gradually Appeared Status: Date Acquired: 08/11/2015 Comorbid Cataracts, Chronic Obstructive Weeks Of Treatment: 55 History: Pulmonary Disease (COPD), Clustered Wound: No Osteoarthritis Photos Photo Uploaded By: Alejandro Mulling on 02/05/2017 16:18:27 Wound Measurements Length: (cm) 6 Width: (cm) 6 Depth: (cm) 0.3 Area: (cm) 28.274 Volume: (cm) 8.482 % Reduction in Area: -63.6% % Reduction in Volume: -63.6% Epithelialization: Small (1-33%) Tunneling: No Undermining: No Wound Description Full Thickness Without Exposed Foul Odor After Classification: Support Structures Slough/Fibrino Wound Margin: Indistinct, nonvisible Exudate Large Amount: Exudate Type: Purulent Exudate Color: yellow, brown, green Cleansing: No Yes Wound Bed Granulation Amount: None Present (0%) Exposed Structure Necrotic Amount: Large (67-100%) Fascia Exposed: No Ky, Ines C. ([id number]) Necrotic Quality: Adherent Slough Fat Layer (Subcutaneous Tissue) Exposed: Yes Tendon Exposed: No Muscle Exposed: No Joint Exposed: No Bone Exposed: No Periwound Skin Texture Texture Color No Abnormalities Noted: No No Abnormalities Noted: No Erythema: Yes Moisture No Abnormalities Noted: No Temperature / Pain Maceration: Yes Temperature: No Abnormality Tenderness on Palpation: Yes Wound Preparation Ulcer Cleansing: Other: soap and water, Topical Anesthetic Applied: Other: lidocaine 4%, Treatment Notes Wound #1 (Right, Lateral Malleolus) 1. Cleansed with: Clean wound with Normal Saline Cleanse wound with antibacterial soap and water 2. Anesthetic Topical Lidocaine 4% cream to wound bed prior to debridement 3. Peri-wound Care: Barrier cream 4. Dressing Applied: Hydrafera Blue 5. Secondary Dressing Applied ABD Pad 7. Secured with Tape 3 Layer  Compression System - Right Lower Extremity Notes unna to anchor, drawtex, xtrasorb Electronic Signature(s) Signed: 02/05/2017 5:04:17 PM By: Alejandro Mulling Entered By: Alejandro Mulling on 02/05/2017 08:17:54 [name] ([id number]) -------------------------------------------------------------------------------- Wound Assessment Details Patient Name: [name] Date of Service: 02/05/2017 8:00 AM Medical Record Number: [id number] Patient Account Number: [id number] Date of Birth/Sex: 04-Jan-1946 (71 y.o. Male) Treating RN: Ashok Cordia, [name] Primary Care Lanetta Figuero: [name] Other Clinician: Referring Vasilios Ottaway: [name] Treating Johan Creveling/Extender: [name] Weeks in Treatment: 55 Wound Status Wound Number: 2 Primary Venous Leg Ulcer Etiology: Wound Location:  Right Malleolus - Medial Wound Open Wounding Event: Gradually Appeared Status: Date Acquired: 01/30/2016 Comorbid Cataracts, Chronic Obstructive Weeks Of Treatment: 53 History: Pulmonary Disease (COPD), Clustered Wound: Yes Osteoarthritis Photos Photo Uploaded By: Alejandro Mulling on 02/05/2017 16:20:08 Wound Measurements Length: (cm) 2.6 Width: (cm) 2.5 Depth: (cm) 0.2 Area: (cm) 5.105 Volume: (cm) 1.021 % Reduction in Area: 55.8% % Reduction in Volume: 11.6% Epithelialization: None Tunneling: No Undermining: No Wound Description Full Thickness Without Exposed Foul Odor After Classification: Support Structures Slough/Fibrino Wound Margin: Indistinct, nonvisible Exudate Large Amount: Exudate Type: Serous Exudate Color: amber Cleansing: No Yes Wound Bed Granulation Amount: Medium (34-66%) Exposed Structure Granulation Quality: Pink Fascia Exposed: No Pennings, [name] C. ([id number]) Necrotic Amount: Medium (34-66%) Fat Layer (Subcutaneous Tissue) Exposed: Yes Necrotic Quality: Adherent Slough Tendon Exposed: No Muscle Exposed: No Joint Exposed: No Bone Exposed:  No Periwound Skin Texture Texture Color No Abnormalities Noted: No No Abnormalities Noted: No Scarring: Yes Temperature / Pain Moisture Temperature: No Abnormality No Abnormalities Noted: No Tenderness on Palpation: Yes Maceration: Yes Wound Preparation Ulcer Cleansing: Other: soap and water, Topical Anesthetic Applied: Other: Lidocaone 4%, Treatment Notes Wound #2 (Right, Medial Malleolus) 1. Cleansed with: Clean wound with Normal Saline Cleanse wound with antibacterial soap and water 2. Anesthetic Topical Lidocaine 4% cream to wound bed prior to debridement 3. Peri-wound Care: Barrier cream 4. Dressing Applied: Hydrafera Blue 5. Secondary Dressing Applied ABD Pad 7. Secured with Tape 3 Layer Compression System - Right Lower Extremity Notes unna to anchor, drawtex, xtrasorb Electronic Signature(s) Signed: 02/05/2017 5:04:17 PM By: Alejandro Mulling Entered By: Alejandro Mulling on 02/05/2017 08:18:22 [name] ([id number]) -------------------------------------------------------------------------------- Vitals Details Patient Name: [name] Date of Service: 02/05/2017 8:00 AM Medical Record Number: [id number] Patient Account Number: [id number] Date of Birth/Sex: 08-14-1946 (71 y.o. Male) Treating RN: Ashok Cordia, [name] Primary Care Ashly Yepez: [name] Other Clinician: Referring Leeman Johnsey: [name] Treating Abdullah Rizzi/Extender: [name] Weeks in Treatment: 55 Vital Signs Time Taken: 08:05 Temperature (F): 97.8 Height (in): 76 Pulse (bpm): 66 Weight (lbs): 238 Respiratory Rate (breaths/min): 18 Body Mass Index (BMI): 29 Blood Pressure (mmHg): 144/58 Reference Range: 80 - 120 mg / dl Electronic Signature(s) Signed: 02/05/2017 5:04:17 PM By: Alejandro Mulling Entered By: Alejandro Mulling on 02/05/2017 08:09:49

## 2017-02-06 NOTE — Progress Notes (Signed)
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 Non-pressure chronic ulcer of right calf with fat layer exposed Physician Procedures CPT4: Description Modifier Quantity Code 1324401 11042 - WC PHYS SUBQ TISS 20 SQ CM 1 ICD-10  Description Diagnosis I87.331 ADE, STMARIE ([id number]) Electronic Signature(s) Signed: 02/05/2017 6:01:27 PM By: [name] MD Entered By: [name] on 02/05/2017 09:33:13

## 2017-02-08 DIAGNOSIS — I87331 Chronic venous hypertension (idiopathic) with ulcer and inflammation of right lower extremity: Secondary | ICD-10-CM | POA: Diagnosis not present

## 2017-02-11 NOTE — Progress Notes (Signed)
[name], [name] ([id number]) Visit Report for 02/08/2017 Arrival Information Details Patient Name: [name], [name]. Date of Service: 02/08/2017 8:15 AM Medical Record Number: [id number] Patient Account Number: [id number] Date of Birth/Sex: 28-Dec-1945 (71 y.o. Male) Treating [name]: [name] Primary Care [name]: [name] Other Clinician: Referring [name]: [name] Treating [name]/Extender: [name] in Treatment: 16 Visit Information History Since Last Visit Added or deleted any medications: No Patient Arrived: Ambulatory Any new allergies or adverse reactions: No Arrival Time: 08:00 Had a fall or experienced change in No Accompanied By: self activities of daily living that may affect Transfer Assistance: None risk of falls: Patient Identification Verified: Yes Signs or symptoms of abuse/neglect since last No Secondary Verification Process Yes visito Completed: Has Dressing in Place as Prescribed: Yes Patient Requires Transmission-Based No Has Compression in Place as Prescribed: Yes Precautions: Pain Present Now: No Patient Has Alerts: No Electronic Signature(s) Signed: 02/08/2017 8:20:06 AM By: [name], BSN, [name], CWS, [name], BSN Entered By: [name], BSN, [name], CWS, [name] on 02/08/2017 08:20:06 [name] ([id number]) -------------------------------------------------------------------------------- Compression Therapy Details Patient Name: [name] Date of Service: 02/08/2017 8:15 AM Medical Record Number: [id number] Patient Account Number: [id number] Date of Birth/Sex: [birth year] (71 y.o. Male) Treating [name]: [name] Primary Care Jamylah Marinaccio: [name] Other Clinician: Referring Bette Brienza: [name] Treating [name]/Extender: [name] in Treatment: 55 Compression Therapy Performed for Wound Wound #1 Right,Lateral Malleolus Assessment: Performed By: [name] Gash, [name] Compression Type: Three Layer Pre  Treatment ABI: 1.2 Electronic Signature(s) Signed: 02/08/2017 8:22:38 AM By: [name], BSN, [name], CWS, [name], BSN Entered By: [name], BSN, [name], CWS, [name] on 02/08/2017 08:22:38 [name] ([id number]) -------------------------------------------------------------------------------- Encounter Discharge Information Details Patient Name: [name] Date of Service: 02/08/2017 8:15 AM Medical Record Number: [id number] Patient Account Number: [id number] Date of Birth/Sex: Jun 01, 1946 (71 y.o. Male) Treating [name]: [name] Primary Care Aleksandar Duve: [name] Other Clinician: Referring [name]: [name] Treating [name]/Extender: [name] in Treatment: 7 Encounter Discharge Information Items Discharge Pain Level: 0 Discharge Condition: Stable Ambulatory Status: Ambulatory Discharge Destination: Home Private Transportation: Auto Accompanied By: self Schedule Follow-up Appointment: Yes Medication Reconciliation completed and Yes provided to Patient/Care [name]: Clinical Summary of Care: Electronic Signature(s) Signed: 02/08/2017 8:24:57 AM By: [name], BSN, [name], CWS, [name], BSN Entered By: [name], BSN, [name], CWS, [name] on 02/08/2017 08:24:57 [name] ([id number]) -------------------------------------------------------------------------------- Patient/Caregiver Education Details Patient Name: [name] Date of Service: 02/08/2017 8:15 AM Medical Record Number: [id number] Patient Account Number: [id number] Date of Birth/Gender: 1946/03/04 (71 y.o. Male) Treating [name]: [name] Primary Care Physician: [name] Other Clinician: Referring Physician: Darreld [name] Treating Physician/Extender: [name] in Treatment: 7 Education Assessment Education Provided To: Patient Education Topics Provided Venous: Controlling Swelling with Multilayered Compression Wraps, Other: elevate and keep dressing Handouts: dry Methods:  Explain/Verbal Responses: State content correctly Electronic Signature(s) Signed: 02/08/2017 8:26:15 AM By: [name], BSN, [name], CWS, [name], BSN Entered By: [name], BSN, [name], CWS, [name] on 02/08/2017 08:24:43 [name] ([id number]) -------------------------------------------------------------------------------- Wound Assessment Details Patient Name: [name] Date of Service: 02/08/2017 8:15 AM Medical Record Number: [id number] Patient Account Number: [id number] Date of Birth/Sex: [birth year] (71 y.o. Male) Treating [name]: [name] Primary Care Coraleigh [name]: [name] Other Clinician: Referring [name]: [name] Treating [name]/Extender: [name] in Treatment: 55 Wound Status Wound Number: 1 Primary Venous Leg Ulcer Etiology: Wound Location: Right Malleolus - Lateral Wound Open Wounding Event: Gradually Appeared Status: Date Acquired: 08/11/2015  Comorbid Cataracts, Chronic Obstructive Weeks Of Treatment: 55 History: Pulmonary Disease (COPD), Clustered Wound: No Osteoarthritis Photos Photo Uploaded By: [name] GurneyWoody, BSN, [name], CWS, [name] on 02/08/2017 08:26:05 Wound Measurements Length: (cm) 4.5 Width: (cm) 3.5 Depth: (cm) 0.3 Area: (cm) 12.37 Volume: (cm) 3.711 % Reduction in Area: 28.4% % Reduction in Volume: 28.4% Epithelialization: Small (1-33%) Tunneling: No Undermining: No Wound Description Full Thickness Without Exposed Foul Odor After Classification: Support Structures Slough/Fibrino Wound Margin: Indistinct, nonvisible Exudate Large Amount: Exudate Type: Purulent Exudate Color: yellow, brown, green Cleansing: No Yes Wound Bed Granulation Amount: Small (1-33%) Exposed Structure Granulation Quality: Red, Pink Fascia Exposed: No Necrotic Amount: Medium (34-66%) Fat Layer (Subcutaneous Tissue) Exposed: Yes Necrotic Quality: Adherent Slough Tendon Exposed: No [name], Kahlel C. ([id number]) Muscle Exposed: No Joint Exposed:  No Bone Exposed: No Periwound Skin Texture Texture Color No Abnormalities Noted: No No Abnormalities Noted: No Callus: No Atrophie Blanche: No Crepitus: No Cyanosis: No Excoriation: No Ecchymosis: Yes Induration: No Erythema: No Rash: No Hemosiderin Staining: No Scarring: Yes Mottled: No Pallor: No Moisture Rubor: No No Abnormalities Noted: No Dry / Scaly: No Temperature / Pain Maceration: No Temperature: No Abnormality Tenderness on Palpation: Yes Wound Preparation Ulcer Cleansing: Other: soap and water, Treatment Notes Wound #1 (Right, Lateral Malleolus) 1. Cleansed with: Cleanse wound with antibacterial soap and water 3. Peri-wound Care: Barrier cream Ointment 4. Dressing Applied: Hydrafera Blue 5. Secondary Dressing Applied ABD Pad 6. Footwear/Offloading device applied Surgical shoe 7. Secured with 3 Layer Compression System - Right Lower Extremity Notes Dome Paste to anchor; x-sorb Psychologist, prison [facility] probation serviceslect[facility] Signature(s) Signed: 02/08/2017 8:21:31 AM By: [name] GurneyWoody, BSN, [name], CWS, [name], BSN Entered By: [name] GurneyWoody, BSN, [name], CWS, [name] on 02/08/2017 08:21:29 [name], [name] C. ([id number]) -------------------------------------------------------------------------------- Wound Assessment Details Patient Name: [name], Clemente C. Date of Service: 02/08/2017 8:15 AM Medical Record Number: [id number] Patient Account Number: [id number] Date of Birth/Sex: 05-Jul-1946 [age] y.o. Male) Treating [name]: [name] CoventryWoody, [name] Primary Care Zakariyah Freimark: [name] McleanMILES, LINDA Other Clinician: Referring Caedan Sumler: [name] McleanMILES, LINDA Treating Demerius Podolak/Extender: [name] ReBritto, Errol Weeks in Treatment: 55 Wound Status Wound Number: 2 Primary Venous Leg Ulcer Etiology: Wound Location: Right Malleolus - Medial Wound Open Wounding Event: Gradually Appeared Status: Date Acquired: 01/30/2016 Comorbid Cataracts, Chronic Obstructive Weeks Of Treatment: 53 History: Pulmonary Disease (COPD), Clustered Wound:  Yes Osteoarthritis Photos Wound Measurements Length: (cm) 3.1 Width: (cm) 2.5 Depth: (cm) 0.2 Area: (cm) 6.087 Volume: (cm) 1.217 % Reduction in Area: 47.3% % Reduction in Volume: -5.4% Epithelialization: None Tunneling: No Undermining: No Wound Description Full Thickness Without Exposed Foul Odor After Classification: Support Structures Slough/Fibrino Wound Margin: Indistinct, nonvisible Exudate Large Amount: Exudate Type: Serous Exudate Color: amber Cleansing: No Yes Wound Bed Granulation Amount: Medium (34-66%) Exposed Structure Granulation Quality: Pink Fascia Exposed: No Necrotic Amount: Medium (34-66%) Fat Layer (Subcutaneous Tissue) Exposed: Yes Necrotic Quality: Adherent Slough Tendon Exposed: No Muscle Exposed: No Streety, Cortavius C. ([id number]) Joint Exposed: No Bone Exposed: No Periwound Skin Texture Texture Color No Abnormalities Noted: No No Abnormalities Noted: No Callus: No Atrophie Blanche: No Crepitus: No Cyanosis: No Excoriation: No Ecchymosis: Yes Induration: No Erythema: No Rash: No Hemosiderin Staining: No Scarring: Yes Mottled: No Pallor: No Moisture Rubor: No No Abnormalities Noted: No Dry / Scaly: No Temperature / Pain Maceration: No Temperature: No Abnormality Tenderness on Palpation: Yes Wound Preparation Ulcer Cleansing: Other: soap and water, Treatment Notes Wound #2 (Right, Medial Malleolus) 1. Cleansed with: Cleanse wound with antibacterial soap and water 3. Peri-wound Care: Barrier cream Ointment 4. Dressing Applied: Hydrafera Blue 5.  Secondary Dressing Applied ABD Pad 6. Footwear/Offloading device applied Surgical shoe 7. Secured with 3 Layer Compression System - Right Lower Extremity Notes Dome Paste to anchor; x-sorb Psychologist, prison and probation services) Signed: 02/08/2017 8:22:13 AM By: [name], BSN, [name], CWS, [name], BSN Entered By: [name], BSN, [name], CWS, [name] on 02/08/2017 08:22:11

## 2017-02-12 ENCOUNTER — Ambulatory Visit (INDEPENDENT_AMBULATORY_CARE_PROVIDER_SITE_OTHER): Payer: Medicare HMO | Admitting: Vascular Surgery

## 2017-02-12 ENCOUNTER — Encounter: Payer: Medicare HMO | Admitting: Internal Medicine

## 2017-02-12 DIAGNOSIS — I87331 Chronic venous hypertension (idiopathic) with ulcer and inflammation of right lower extremity: Secondary | ICD-10-CM | POA: Diagnosis not present

## 2017-02-13 NOTE — Progress Notes (Signed)
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 T85.613A encounter Disruption of external operation (surgical) wound, not elsewhere classified, subsequent T81.31XD encounter L97.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 of skin Facility Procedures CPT4: Description Modifier Quantity Code 24401027 11042 - DEB SUBQ TISSUE 20 SQ CM/< 1 ICD-10 Description Diagnosis 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

## 2017-02-13 NOTE — Progress Notes (Signed)
DEMARIUS, [name] ([id number]) Visit Report for 02/12/2017 Arrival Information Details Patient Name: [name], [name]. Date of Service: 02/12/2017 8:00 AM Medical Record Number: [id number] Patient Account Number: [id number] Date of Birth/Sex: 1946/05/11 (71 y.o. Male) Treating RN: [name] Primary Care Chaz Mcglasson: [name] Other Clinician: Referring Lyndal Reggio: [name] Treating Aubrea Meixner/Extender: Altamese  in Treatment: 56 Visit Information History Since Last Visit All ordered tests and consults were completed: No Patient Arrived: Ambulatory Added or deleted any medications: No Arrival Time: 08:02 Any new allergies or adverse reactions: No Accompanied By: self Had a fall or experienced change in No Transfer Assistance: None activities of daily living that may affect Patient Identification Verified: Yes risk of falls: Secondary Verification Process Yes Signs or symptoms of abuse/neglect since last No Completed: visito Patient Requires Transmission-Based No Hospitalized since last visit: No Precautions: Has Dressing in Place as Prescribed: Yes Patient Has Alerts: No Has Compression in Place as Prescribed: Yes Pain Present Now: No Electronic Signature(s) Signed: 02/12/2017 5:11:11 PM By: Alejandro Mulling Entered By: Alejandro Mulling on 02/12/2017 08:06:31 [name] ([id number]) -------------------------------------------------------------------------------- Encounter Discharge Information Details Patient Name: [name] Date of Service: 02/12/2017 8:00 AM Medical Record Number: [id number] Patient Account Number: [id number] Date of Birth/Sex: [birth year] (71 y.o. Male) Treating RN: [name] Primary Care Elihu Milstein: [name] Other Clinician: Referring Aislynn Cifelli: [name] Treating Lucretia Pendley/Extender: [name] Weeks in Treatment: 47 Encounter Discharge Information Items Discharge Pain Level: 0 Discharge  Condition: Stable Ambulatory Status: Ambulatory Discharge Destination: Home Private Transportation: Auto Accompanied By: self Schedule Follow-up Appointment: Yes Medication Reconciliation completed and No provided to Patient/Care Hatem Cull: Clinical Summary of Care: Electronic Signature(s) Signed: 02/12/2017 5:11:11 PM By: Alejandro Mulling Entered By: Alejandro Mulling on 02/12/2017 08:23:54 [name] ([id number]) -------------------------------------------------------------------------------- Lower Extremity Assessment Details Patient Name: [name] Date of Service: 02/12/2017 8:00 AM Medical Record Number: [id number] Patient Account Number: [id number] Date of Birth/Sex: 08-Nov-1945 (71 y.o. Male) Treating RN: [name] Primary Care Levern Kalka: [name] Other Clinician: Referring Metha Kolasa: [name] Treating Jann Ra/Extender: [name] Weeks in Treatment: 56 Vascular Assessment Pulses: Dorsalis Pedis Palpable: [Right:Yes] Posterior Tibial Extremity colors, hair growth, and conditions: Extremity Color: [Right:Normal] Temperature of Extremity: [Right:Warm] Capillary Refill: [Right:< 3 seconds] Electronic Signature(s) Signed: 02/12/2017 5:11:11 PM By: Alejandro Mulling Entered By: Alejandro Mulling on 02/12/2017 08:19:49 [name] ([id number]) -------------------------------------------------------------------------------- Multi Wound Chart Details Patient Name: [name] Date of Service: 02/12/2017 8:00 AM Medical Record Number: [id number] Patient Account Number: [id number] Date of Birth/Sex: May 24, 1946 (71 y.o. Male) Treating RN: Ashok Cordia, [name] Primary Care Raijon Lindfors: [name] Other Clinician: Referring Inika Bellanger: [name] Treating Esmirna Ravan/Extender: [name] Weeks in Treatment: 56 Vital Signs Height(in): 76 Pulse(bpm): 80 Weight(lbs): 238 Blood Pressure 134/73 (mmHg): Body Mass Index(BMI):  29 Temperature(F): 97.9 Respiratory Rate 18 (breaths/min): Photos: [1:No Photos] [2:No Photos] [N/A:N/A] Wound Location: [1:Right Malleolus - Lateral] [2:Right Malleolus - Medial] [N/A:N/A] Wounding Event: [1:Gradually Appeared] [2:Gradually Appeared] [N/A:N/A] Primary Etiology: [1:Venous Leg Ulcer] [2:Venous Leg Ulcer] [N/A:N/A] Comorbid History: [1:Cataracts, Chronic Obstructive Pulmonary Disease (COPD), Osteoarthritis] [2:Cataracts, Chronic Obstructive Pulmonary Disease (COPD), Osteoarthritis] [N/A:N/A] Date Acquired: [1:08/11/2015] [2:01/30/2016] [N/A:N/A] Weeks of Treatment: [1:56] [2:54] [N/A:N/A] Wound Status: [1:Open] [2:Open] [N/A:N/A] Clustered Wound: [1:No] [2:Yes] [N/A:N/A] Measurements L x W x D 6x6x0.3 [2:2.4x2.3x0.2] [N/A:N/A] (cm) Area (cm) : [1:28.274] [2:4.335] [N/A:N/A] Volume (cm) : [1:8.482] [2:0.867] [N/A:N/A] % Reduction in Area: [1:-63.60%] [2:62.50%] [N/A:N/A] % Reduction in Volume: -63.60% [2:24.90%] [N/A:N/A] Classification: [1:Full Thickness Without Exposed Support Structures] [2:Full Thickness  Without Exposed Support Structures] [N/A:N/A] Exudate Amount: [1:Large] [2:Large] [N/A:N/A] Exudate Type: [1:Purulent] [2:Serosanguineous] [N/A:N/A] Exudate Color: [1:yellow, brown, green] [2:red, brown] [N/A:N/A] Wound Margin: [1:Indistinct, nonvisible] [2:Indistinct, nonvisible] [N/A:N/A] Granulation Amount: [1:Small (1-33%)] [2:Small (1-33%)] [N/A:N/A] Granulation Quality: [1:Red, Pink] [2:Pink] [N/A:N/A] Necrotic Amount: [1:Large (67-100%)] [2:Large (67-100%)] [N/A:N/A] Exposed Structures: [1:Fat Layer (Subcutaneous Tissue) Exposed: Yes] [2:Fat Layer (Subcutaneous Tissue) Exposed: Yes] [N/A:N/A] Fascia: No Fascia: No Tendon: No Tendon: No Muscle: No Muscle: No Joint: No Joint: No Bone: No Bone: No Epithelialization: Small (1-33%) None N/A Debridement: Debridement (82956- N/A N/A 21308) Pre-procedure 08:23 N/A N/A Verification/Time  Out Taken: Pain Control: Lidocaine 4% Topical N/A N/A Solution Tissue Debrided: Fibrin/Slough, Exudates, N/A N/A Subcutaneous Level: Skin/Subcutaneous N/A N/A Tissue Debridement Area (sq 36 N/A N/A cm): Instrument: Curette N/A N/A Bleeding: Minimum N/A N/A Hemostasis Achieved: Pressure N/A N/A Procedural Pain: 0 N/A N/A Post Procedural Pain: 0 N/A N/A Debridement Treatment Procedure was tolerated N/A N/A Response: well Post Debridement 6x6x0.3 N/A N/A Measurements L x W x D (cm) Post Debridement 8.482 N/A N/A Volume: (cm) Periwound Skin Texture: Scarring: Yes Scarring: Yes N/A Excoriation: No Excoriation: No Induration: No Induration: No Callus: No Callus: No Crepitus: No Crepitus: No Rash: No Rash: No Periwound Skin Maceration: Yes Maceration: No N/A Moisture: Dry/Scaly: No Dry/Scaly: No Periwound Skin Color: Ecchymosis: Yes Ecchymosis: Yes N/A Atrophie Blanche: No Atrophie Blanche: No Cyanosis: No Cyanosis: No Erythema: No Erythema: No Hemosiderin Staining: No Hemosiderin Staining: No Mottled: No Mottled: No Pallor: No Pallor: No Rubor: No Rubor: No Temperature: No Abnormality No Abnormality N/A Tenderness on Yes Yes N/A Palpation: Cohick, Pope C. ([id number]) Wound Preparation: Ulcer Cleansing: Other: Ulcer Cleansing: Other: N/A soap and water soap and water Topical Anesthetic Topical Anesthetic Applied: Other: lidocaine Applied: Other: lidocaine 4% 4% Procedures Performed: Debridement N/A N/A Treatment Notes Wound #1 (Right, Lateral Malleolus) 1. Cleansed with: Clean wound with Normal Saline Cleanse wound with antibacterial soap and water 2. Anesthetic Topical Lidocaine 4% cream to wound bed prior to debridement 3. Peri-wound Care: Barrier cream Moisturizing lotion 4. Dressing Applied: Hydrafera Blue 5. Secondary Dressing Applied ABD Pad 7. Secured with Tape 3 Layer Compression System - Right Lower Extremity Notes unna to  anchor, drawtex, xtrasorb Wound #2 (Right, Medial Malleolus) 1. Cleansed with: Clean wound with Normal Saline Cleanse wound with antibacterial soap and water 2. Anesthetic Topical Lidocaine 4% cream to wound bed prior to debridement 3. Peri-wound Care: Barrier cream Moisturizing lotion 4. Dressing Applied: Hydrafera Blue 5. Secondary Dressing Applied ABD Pad 7. Secured with Tape 3 Layer Compression System - Right Lower Extremity Notes unna to anchor, drawtex, xtrasorb PRATHER, FAILLA ([id number]) Electronic Signature(s) Signed: 02/12/2017 4:47:51 PM By: [name] MD Entered By: [name] on 02/12/2017 08:33:44 [name] ([id number]) -------------------------------------------------------------------------------- Multi-Disciplinary Care Plan Details Patient Name: [name] Date of Service: 02/12/2017 8:00 AM Medical Record Number: [id number] Patient Account Number: [id number] Date of Birth/Sex: 01/01/1946 (72 y.o. Male) Treating RN: [name] Primary Care Amier Hoyt: [name] Other Clinician: Referring Zykee Avakian: [name] Treating Pauline Pegues/Extender: [name] Weeks in Treatment: 56 Active Inactive ` Venous Leg Ulcer Nursing Diagnoses: Knowledge deficit related to disease process and management Goals: Patient will maintain optimal edema control Date Initiated: 04/03/2016 Target Resolution Date: 11/24/2016 Goal Status: Active Interventions: Compression as ordered Treatment Activities: Therapeutic compression applied : 04/03/2016 Notes: ` Wound/Skin Impairment Nursing Diagnoses: Impaired tissue integrity Goals: Ulcer/skin breakdown will heal within 14 weeks Date Initiated: 01/17/2016 Target Resolution Date: 11/24/2016 Goal Status:  Active Interventions: Assess ulceration(s) every visit Notes: Electronic Signature(s) Signed: 02/12/2017 5:11:11 PM By: Aurelio JewPinkerton, Debra Bushey, [name] ([id number]) Entered By:  Alejandro MullingPinkerton, Debra on 02/12/2017 08:19:57 [name] StackBLACKWELL, Hawthorne C. ([id number]) -------------------------------------------------------------------------------- Pain Assessment Details Patient Name: [name] StackBLACKWELL, Bartlomiej C. Date of Service: 02/12/2017 8:00 AM Medical Record Number: [id number] Patient Account Number: [id number] Date of Birth/Sex: October 07, 1945 [age] y.o. Male) Treating RN: Phillis HaggisPinkerton, [name] Primary Care Neelah Mannings: Darreld McleanMILES, LINDA Other Clinician: Referring Era Parr: Darreld McleanMILES, LINDA Treating Colburn Asper/Extender: Maxwell CaulOBSON, [name] G Weeks in Treatment: 56 Active Problems Location of Pain Severity and Description of Pain Patient Has Paino No Site Locations With Dressing Change: No Pain Management and Medication Current Pain Management: Electronic Signature(s) Signed: 02/12/2017 5:11:11 PM By: Alejandro MullingPinkerton, Debra Entered By: Alejandro MullingPinkerton, Debra on 02/12/2017 08:06:37 [name] StackBLACKWELL, Dimitrius C. ([id number]) -------------------------------------------------------------------------------- Patient/Caregiver Education Details [name], Ark Date of Service: 02/12/2017 8:00 AM Patient Name: C. Patient Account Number: [id number] Medical Record Treating RN: Phillis Haggisinkerton, [name] [id number] Number: Other Clinician: Date of Birth/Gender: October 07, 1945 [age] y.o. Male) Treating [name], [name] Primary Care Physician: Darreld McleanMILES, LINDA Physician/Extender: G Referring Physician: Mickey FarberMILES, LINDA Weeks in Treatment: 1556 Education Assessment Education Provided To: Patient Education Topics Provided Wound/Skin Impairment: Handouts: Other: change dressing as ordered Methods: Demonstration, Explain/Verbal Responses: State content correctly Electronic Signature(s) Signed: 02/12/2017 5:11:11 PM By: Alejandro MullingPinkerton, Debra Entered By: Alejandro MullingPinkerton, Debra on 02/12/2017 08:24:09 [name] StackBLACKWELL, Krishav C. ([id number]) -------------------------------------------------------------------------------- Wound Assessment Details Patient Name: [name] StackBLACKWELL, Zymere  C. Date of Service: 02/12/2017 8:00 AM Medical Record Number: [id number] Patient Account Number: [id number] Date of Birth/Sex: October 07, 1945 [age] y.o. Male) Treating RN: Ashok CordiaPinkerton, [name] Primary Care Theodora Lalanne: Darreld McleanMILES, LINDA Other Clinician: Referring Tanica Gaige: Darreld McleanMILES, LINDA Treating Godfrey Tritschler/Extender: Maxwell CaulOBSON, [name] G Weeks in Treatment: 56 Wound Status Wound Number: 1 Primary Venous Leg Ulcer Etiology: Wound Location: Right Malleolus - Lateral Wound Open Wounding Event: Gradually Appeared Status: Date Acquired: 08/11/2015 Comorbid Cataracts, Chronic Obstructive Weeks Of Treatment: 56 History: Pulmonary Disease (COPD), Clustered Wound: No Osteoarthritis Photos Photo Uploaded By: Alejandro MullingPinkerton, Debra on 02/12/2017 12:56:55 Wound Measurements Length: (cm) 6 Width: (cm) 6 Depth: (cm) 0.3 Area: (cm) 28.274 Volume: (cm) 8.482 % Reduction in Area: -63.6% % Reduction in Volume: -63.6% Epithelialization: Small (1-33%) Tunneling: No Undermining: No Wound Description Full Thickness Without Exposed Foul Odor After Classification: Support Structures Slough/Fibrino Wound Margin: Indistinct, nonvisible Exudate Large Amount: Exudate Type: Purulent Exudate Color: yellow, brown, green Cleansing: No Yes Wound Bed Granulation Amount: Small (1-33%) Exposed Structure Granulation Quality: Red, Pink Fascia Exposed: No Siers, Cordney C. ([id number]) Necrotic Amount: Large (67-100%) Fat Layer (Subcutaneous Tissue) Exposed: Yes Necrotic Quality: Adherent Slough Tendon Exposed: No Muscle Exposed: No Joint Exposed: No Bone Exposed: No Periwound Skin Texture Texture Color No Abnormalities Noted: No No Abnormalities Noted: No Callus: No Atrophie Blanche: No Crepitus: No Cyanosis: No Excoriation: No Ecchymosis: Yes Induration: No Erythema: No Rash: No Hemosiderin Staining: No Scarring: Yes Mottled: No Pallor: No Moisture Rubor: No No Abnormalities Noted: No Dry / Scaly: No  Temperature / Pain Maceration: Yes Temperature: No Abnormality Tenderness on Palpation: Yes Wound Preparation Ulcer Cleansing: Other: soap and water, Topical Anesthetic Applied: Other: lidocaine 4%, Treatment Notes Wound #1 (Right, Lateral Malleolus) 1. Cleansed with: Clean wound with Normal Saline Cleanse wound with antibacterial soap and water 2. Anesthetic Topical Lidocaine 4% cream to wound bed prior to debridement 3. Peri-wound Care: Barrier cream Moisturizing lotion 4. Dressing Applied: Hydrafera Blue 5. Secondary Dressing Applied ABD Pad 7. Secured with Tape 3 Layer Compression System - Right Lower Extremity Notes unna to anchor, drawtex, xtrasorb Electronic Signature(s) Signed: 02/12/2017  5:11:11 PM By: Aurelio Jew, [name] ([id number]) Entered By: Alejandro Mulling on 02/12/2017 08:17:36 [name] ([id number]) -------------------------------------------------------------------------------- Wound Assessment Details Patient Name: [name] Date of Service: 02/12/2017 8:00 AM Medical Record Number: [id number] Patient Account Number: [id number] Date of Birth/Sex: [birth year] (71 y.o. Male) Treating RN: Ashok Cordia, [name] Primary Care Lakelynn Severtson: [name] Other Clinician: Referring Hayes Czaja: [name] Treating Sulaiman Imbert/Extender: [name] Weeks in Treatment: 56 Wound Status Wound Number: 2 Primary Venous Leg Ulcer Etiology: Wound Location: Right Malleolus - Medial Wound Open Wounding Event: Gradually Appeared Status: Date Acquired: 01/30/2016 Comorbid Cataracts, Chronic Obstructive Weeks Of Treatment: 54 History: Pulmonary Disease (COPD), Clustered Wound: Yes Osteoarthritis Photos Photo Uploaded By: Alejandro Mulling on 02/12/2017 12:56:56 Wound Measurements Length: (cm) 2.4 Width: (cm) 2.3 Depth: (cm) 0.2 Area: (cm) 4.335 Volume: (cm) 0.867 % Reduction in Area: 62.5% % Reduction in Volume:  24.9% Epithelialization: None Tunneling: No Undermining: No Wound Description Full Thickness Without Exposed Foul Odor Afte Classification: Support Structures Slough/Fibrino Wound Margin: Indistinct, nonvisible Exudate Large Amount: Exudate Type: Serosanguineous Exudate Color: red, brown r Cleansing: No Yes Wound Bed Granulation Amount: Small (1-33%) Exposed Structure Granulation Quality: Pink Fascia Exposed: No Khiev, Deryl C. ([id number]) Necrotic Amount: Large (67-100%) Fat Layer (Subcutaneous Tissue) Exposed: Yes Necrotic Quality: Adherent Slough Tendon Exposed: No Muscle Exposed: No Joint Exposed: No Bone Exposed: No Periwound Skin Texture Texture Color No Abnormalities Noted: No No Abnormalities Noted: No Callus: No Atrophie Blanche: No Crepitus: No Cyanosis: No Excoriation: No Ecchymosis: Yes Induration: No Erythema: No Rash: No Hemosiderin Staining: No Scarring: Yes Mottled: No Pallor: No Moisture Rubor: No No Abnormalities Noted: No Dry / Scaly: No Temperature / Pain Maceration: No Temperature: No Abnormality Tenderness on Palpation: Yes Wound Preparation Ulcer Cleansing: Other: soap and water, Topical Anesthetic Applied: Other: lidocaine 4%, Treatment Notes Wound #2 (Right, Medial Malleolus) 1. Cleansed with: Clean wound with Normal Saline Cleanse wound with antibacterial soap and water 2. Anesthetic Topical Lidocaine 4% cream to wound bed prior to debridement 3. Peri-wound Care: Barrier cream Moisturizing lotion 4. Dressing Applied: Hydrafera Blue 5. Secondary Dressing Applied ABD Pad 7. Secured with Tape 3 Layer Compression System - Right Lower Extremity Notes unna to anchor, drawtex, xtrasorb Electronic Signature(s) Signed: 02/12/2017 5:11:11 PM By: Aurelio Jew, [name] ([id number]) Entered By: Alejandro Mulling on 02/12/2017 08:18:37 [name]  ([id number]) -------------------------------------------------------------------------------- Vitals Details Patient Name: [name] Date of Service: 02/12/2017 8:00 AM Medical Record Number: [id number] Patient Account Number: [id number] Date of Birth/Sex: 08-05-1946 (71 y.o. Male) Treating RN: Ashok Cordia, [name] Primary Care Odyn Turko: [name] Other Clinician: Referring Taelyn Nemes: [name] Treating Amel Kitch/Extender: [name] Weeks in Treatment: 56 Vital Signs Time Taken: 08:05 Temperature (F): 97.9 Height (in): 76 Pulse (bpm): 80 Weight (lbs): 238 Respiratory Rate (breaths/min): 18 Body Mass Index (BMI): 29 Blood Pressure (mmHg): 134/73 Reference Range: 80 - 120 mg / dl Electronic Signature(s) Signed: 02/12/2017 5:11:11 PM By: Alejandro Mulling Entered By: Alejandro Mulling on 02/12/2017 08:08:20

## 2017-02-15 DIAGNOSIS — I87331 Chronic venous hypertension (idiopathic) with ulcer and inflammation of right lower extremity: Secondary | ICD-10-CM | POA: Diagnosis not present

## 2017-02-19 ENCOUNTER — Encounter: Payer: Medicare HMO | Admitting: Internal Medicine

## 2017-02-19 DIAGNOSIS — I87331 Chronic venous hypertension (idiopathic) with ulcer and inflammation of right lower extremity: Secondary | ICD-10-CM | POA: Diagnosis not present

## 2017-02-19 NOTE — Progress Notes (Signed)
RHODES, CALVERT ([id number]) Visit Report for 02/15/2017 Arrival Information Details Patient Name: [name], [name]. Date of Service: 02/15/2017 8:00 AM Medical Record Number: [id number] Patient Account Number: [id number] Date of Birth/Sex: 1946-08-27 (71 y.o. Male) Treating RN: [name] Primary Care Mairlyn Tegtmeyer: [name] Other Clinician: Referring Avey Mcmanamon: [name] Treating Claris Guymon/Extender: STONE III, HOYT Weeks in Treatment: 56 Visit Information History Since Last Visit All ordered tests and consults were completed: No Patient Arrived: Ambulatory Added or deleted any medications: No Arrival Time: 07:58 Any new allergies or adverse reactions: No Accompanied By: self Had a fall or experienced change in No Transfer Assistance: None activities of daily living that may affect Patient Identification Verified: Yes risk of falls: Secondary Verification Process Yes Signs or symptoms of abuse/neglect since last No Completed: visito Patient Requires Transmission-Based No Hospitalized since last visit: No Precautions: Has Dressing in Place as Prescribed: Yes Patient Has Alerts: No Has Compression in Place as Prescribed: Yes Pain Present Now: No Electronic Signature(s) Signed: 02/18/2017 4:29:00 PM By: Alejandro Mulling Entered By: Alejandro Mulling on 02/15/2017 07:59:41 [name] ([id number]) -------------------------------------------------------------------------------- Encounter Discharge Information Details Patient Name: [name] Date of Service: 02/15/2017 8:00 AM Medical Record Number: [id number] Patient Account Number: [id number] Date of Birth/Sex: 12/08/1945 (71 y.o. Male) Treating RN: [name] Primary Care Andre Gallego: [name] Other Clinician: Referring Glenn Gullickson: [name] Treating Salia Cangemi/Extender: STONE III, HOYT Weeks in Treatment: 26 Encounter Discharge Information Items Discharge Pain Level: 0 Discharge  Condition: Stable Ambulatory Status: Ambulatory Discharge Destination: Home Private Transportation: Auto Accompanied By: self Schedule Follow-up Appointment: Yes Medication Reconciliation completed and No provided to Patient/Care Onica Davidovich: Clinical Summary of Care: Electronic Signature(s) Signed: 02/18/2017 4:29:00 PM By: Alejandro Mulling Entered By: Alejandro Mulling on 02/15/2017 08:02:15 [name] ([id number]) -------------------------------------------------------------------------------- Patient/Caregiver Education Details Patient Name: [name] Date of Service: 02/15/2017 8:00 AM Medical Record Number: [id number] Patient Account Number: [id number] Date of Birth/Gender: 06/21/1946 (71 y.o. Male) Treating RN: [name] Primary Care Physician: [name] Other Clinician: Referring Physician: [name] Treating Physician/Extender: Linwood Dibbles, HOYT Weeks in Treatment: 40 Education Assessment Education Provided To: Patient Education Topics Provided Wound/Skin Impairment: Handouts: Other: do not get wrap wet Methods: Demonstration, Explain/Verbal Responses: State content correctly Electronic Signature(s) Signed: 02/18/2017 4:29:00 PM By: Alejandro Mulling Entered By: Alejandro Mulling on 02/15/2017 08:02:00 [name] ([id number]) -------------------------------------------------------------------------------- Wound Assessment Details Patient Name: [name] Date of Service: 02/15/2017 8:00 AM Medical Record Number: [id number] Patient Account Number: [id number] Date of Birth/Sex: [birth year] (71 y.o. Male) Treating RN: Ashok Cordia, [name] Primary Care Ramir Malerba: [name] Other Clinician: Referring Shylyn Younce: [name] Treating Jailine Lieder/Extender: STONE III, HOYT Weeks in Treatment: 56 Wound Status Wound Number: 1 Primary Venous Leg Ulcer Etiology: Wound Location: Right Malleolus - Lateral Wound Open Wounding Event:  Gradually Appeared Status: Date Acquired: 08/11/2015 Comorbid Cataracts, Chronic Obstructive Weeks Of Treatment: 56 History: Pulmonary Disease (COPD), Clustered Wound: No Osteoarthritis Photos Photo Uploaded By: Alejandro Mulling on 02/15/2017 08:28:43 Wound Measurements Length: (cm) 6 Width: (cm) 6 Depth: (cm) 0.3 Area: (cm) 28.274 Volume: (cm) 8.482 % Reduction in Area: -63.6% % Reduction in Volume: -63.6% Epithelialization: Small (1-33%) Tunneling: No Undermining: No Wound Description Full Thickness Without Exposed Foul Odor After Classification: Support Structures Slough/Fibrino Wound Margin: Indistinct, nonvisible Exudate Large Amount: Exudate Type: Purulent Exudate Color: yellow, brown, green Cleansing: No Yes Wound Bed Granulation Amount: Small (1-33%) Exposed Structure Granulation Quality: Red, Pink Fascia Exposed: No Sebo, Amere C. ([id number]) Necrotic Amount: Large (67-100%) Fat Layer (Subcutaneous  Tissue) Exposed: Yes Necrotic Quality: Adherent Slough Tendon Exposed: No Muscle Exposed: No Joint Exposed: No Bone Exposed: No Periwound Skin Texture Texture Color No Abnormalities Noted: No No Abnormalities Noted: No Callus: No Atrophie Blanche: No Crepitus: No Cyanosis: No Excoriation: No Ecchymosis: Yes Induration: No Erythema: No Rash: No Hemosiderin Staining: No Scarring: Yes Mottled: No Pallor: No Moisture Rubor: No No Abnormalities Noted: No Dry / Scaly: No Temperature / Pain Maceration: Yes Temperature: No Abnormality Tenderness on Palpation: Yes Wound Preparation Ulcer Cleansing: Other: soap and water, Topical Anesthetic Applied: None, Other: lidocaine 4%, Treatment Notes Wound #1 (Right, Lateral Malleolus) 1. Cleansed with: Clean wound with Normal Saline Cleanse wound with antibacterial soap and water 3. Peri-wound Care: Barrier cream Moisturizing lotion 4. Dressing Applied: Hydrafera Blue 5. Secondary  Dressing Applied ABD Pad 7. Secured with Tape 3 Layer Compression System - Right Lower Extremity Notes unna to anchor, drawtex, xtrasorb Electronic Signature(s) Signed: 02/18/2017 4:29:00 PM By: Alejandro MullingPinkerton, Debra Entered By: Alejandro MullingPinkerton, Debra on 02/15/2017 08:00:22 [name], [name] ([id number]) [name], [name] ([id number]) -------------------------------------------------------------------------------- Wound Assessment Details Patient Name: [name], [name] Date of Service: 02/15/2017 8:00 AM Medical Record Number: [id number] Patient Account Number: [id number] Date of Birth/Sex: [birth year](70 y.o. Male) Treating RN: Ashok CordiaPinkerton, [name] Primary Care Ashraf Mesta: Darreld McleanMILES, LINDA Other Clinician: Referring Marget Outten: Darreld McleanMILES, LINDA Treating Ameena Vesey/Extender: STONE III, HOYT Weeks in Treatment: 56 Wound Status Wound Number: 2 Primary Venous Leg Ulcer Etiology: Wound Location: Right Malleolus - Medial Wound Open Wounding Event: Gradually Appeared Status: Date Acquired: 01/30/2016 Comorbid Cataracts, Chronic Obstructive Weeks Of Treatment: 54 History: Pulmonary Disease (COPD), Clustered Wound: Yes Osteoarthritis Photos Photo Uploaded By: Alejandro MullingPinkerton, Debra on 02/15/2017 08:28:43 Wound Measurements Length: (cm) 2.4 Width: (cm) 2.3 Depth: (cm) 0.2 Area: (cm) 4.335 Volume: (cm) 0.867 % Reduction in Area: 62.5% % Reduction in Volume: 24.9% Epithelialization: None Tunneling: No Undermining: No Wound Description Full Thickness Without Exposed Classification: Support Structures Wound Margin: Indistinct, nonvisible Exudate Large Amount: Exudate Type: Serosanguineous Exudate Color: red, brown Foul Odor After Cleansing: No Slough/Fibrino Yes Wound Bed Granulation Amount: Small (1-33%) Exposed Structure Granulation Quality: Pink Fascia Exposed: No [name], [name] ([id number]) Necrotic Amount: Large (67-100%) Fat Layer (Subcutaneous Tissue) Exposed: Yes Necrotic  Quality: Adherent Slough Tendon Exposed: No Muscle Exposed: No Joint Exposed: No Bone Exposed: No Periwound Skin Texture Texture Color No Abnormalities Noted: No No Abnormalities Noted: No Callus: No Atrophie Blanche: No Crepitus: No Cyanosis: No Excoriation: No Ecchymosis: Yes Induration: No Erythema: No Rash: No Hemosiderin Staining: No Scarring: Yes Mottled: No Pallor: No Moisture Rubor: No No Abnormalities Noted: No Dry / Scaly: No Temperature / Pain Maceration: No Temperature: No Abnormality Tenderness on Palpation: Yes Wound Preparation Ulcer Cleansing: Other: soap and water, Topical Anesthetic Applied: None, Other: lidocaine 4%, Treatment Notes Wound #2 (Right, Medial Malleolus) 1. Cleansed with: Clean wound with Normal Saline Cleanse wound with antibacterial soap and water 3. Peri-wound Care: Barrier cream Moisturizing lotion 4. Dressing Applied: Hydrafera Blue 5. Secondary Dressing Applied ABD Pad 7. Secured with Tape 3 Layer Compression System - Right Lower Extremity Notes unna to anchor, drawtex, xtrasorb Electronic Signature(s) Signed: 02/18/2017 4:29:00 PM By: Alejandro MullingPinkerton, Debra Entered By: Alejandro MullingPinkerton, Debra on 02/15/2017 08:00:41

## 2017-02-20 NOTE — Progress Notes (Signed)
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 MD Entered By: Alejandro Mulling on 02/19/2017 08:45:07

## 2017-02-20 NOTE — Progress Notes (Signed)
[name] ([id number]) Visit Report for 02/19/2017 Arrival Information Details Patient Name: [name], [name]. Date of Service: 02/19/2017 8:00 AM Medical Record Number: [id number] Patient Account Number: [id number] Date of Birth/Sex: [birth year] (71 y.o. Male) Treating RN: Ashok Cordia, [name] Primary Care Katty Fretwell: [name] Other Clinician: Referring Aladdin Kollmann: [name] Treating Kelsee Preslar/Extender: Altamese [place] in Treatment: 57 Visit Information History Since Last Visit All ordered tests and consults were completed: No Patient Arrived: Ambulatory Added or deleted any medications: No Arrival Time: 08:00 Any new allergies or adverse reactions: No Accompanied By: self Had a fall or experienced change in No Transfer Assistance: None activities of daily living that may affect Patient Identification Verified: Yes risk of falls: Secondary Verification Process Yes Signs or symptoms of abuse/neglect since last No Completed: visito Patient Requires Transmission-Based No Hospitalized since last visit: No Precautions: Has Dressing in Place as Prescribed: Yes Patient Has Alerts: No Has Compression in Place as Prescribed: Yes Pain Present Now: No Electronic Signature(s) Signed: 02/19/2017 4:41:59 PM By: Alejandro Mulling Entered By: Alejandro Mulling on 02/19/2017 08:08:02 [name] ([id number]) -------------------------------------------------------------------------------- Encounter Discharge Information Details Patient Name: [name] Date of Service: 02/19/2017 8:00 AM Medical Record Number: [id number] Patient Account Number: [id number] Date of Birth/Sex: Nov 06, 1945 (71 y.o. Male) Treating RN: [name] Primary Care Anjali Manzella: [name] Other Clinician: Referring Areen Trautner: [name] Treating Baylyn Sickles/Extender: Altamese [place] in Treatment: 54 Encounter Discharge Information Items Discharge Pain Level: 0 Discharge  Condition: Stable Ambulatory Status: Ambulatory Discharge Destination: Home Transportation: Private Auto Accompanied By: self Schedule Follow-up Appointment: Yes Medication Reconciliation completed No and provided to Patient/Care Adren Dollins: Patient Clinical Summary of Care: Declined Electronic Signature(s) Signed: 02/19/2017 8:46:49 AM By: Gwenlyn Perking Entered By: Gwenlyn Perking on 02/19/2017 08:46:49 [name] ([id number]) -------------------------------------------------------------------------------- Lower Extremity Assessment Details Patient Name: [name] Date of Service: 02/19/2017 8:00 AM Medical Record Number: [id number] Patient Account Number: [id number] Date of Birth/Sex: 14-Jun-1946 (71 y.o. Male) Treating RN: [name] Primary Care Loula Marcella: [name] Other Clinician: Referring Anastashia Westerfeld: [name] Treating Dewight Catino/Extender: [name] Weeks in Treatment: 57 Vascular Assessment Pulses: Dorsalis Pedis Palpable: [Right:Yes] Posterior Tibial Extremity colors, hair growth, and conditions: Extremity Color: [Right:Normal] Temperature of Extremity: [Right:Warm] Capillary Refill: [Right:< 3 seconds] Electronic Signature(s) Signed: 02/19/2017 4:41:59 PM By: Alejandro Mulling Entered By: Alejandro Mulling on 02/19/2017 08:21:07 [name] ([id number]) -------------------------------------------------------------------------------- Multi Wound Chart Details Patient Name: [name] Date of Service: 02/19/2017 8:00 AM Medical Record Number: [id number] Patient Account Number: [id number] Date of Birth/Sex: May 15, 1946 (71 y.o. Male) Treating RN: Ashok Cordia, [name] Primary Care Quantrell Splitt: [name] Other Clinician: Referring Huston Stonehocker: [name] Treating Lajuan Kovaleski/Extender: [name] Weeks in Treatment: 57 Vital Signs Height(in): 76 Pulse(bpm): 78 Weight(lbs): 238 Blood Pressure 142/68 (mmHg): Body Mass  Index(BMI): 29 Temperature(F): 98.2 Respiratory Rate 18 (breaths/min): Photos: [1:No Photos] [2:No Photos] [N/A:N/A] Wound Location: [1:Right Malleolus - Lateral] [2:Right Malleolus - Medial] [N/A:N/A] Wounding Event: [1:Gradually Appeared] [2:Gradually Appeared] [N/A:N/A] Primary Etiology: [1:Venous Leg Ulcer] [2:Venous Leg Ulcer] [N/A:N/A] Comorbid History: [1:Cataracts, Chronic Obstructive Pulmonary Disease (COPD), Osteoarthritis] [2:Cataracts, Chronic Obstructive Pulmonary Disease (COPD), Osteoarthritis] [N/A:N/A] Date Acquired: [1:08/11/2015] [2:01/30/2016] [N/A:N/A] Weeks of Treatment: [1:57] [2:55] [N/A:N/A] Wound Status: [1:Open] [2:Open] [N/A:N/A] Clustered Wound: [1:No] [2:Yes] [N/A:N/A] Measurements L x W x D 6x6x0.3 [2:2.5x2x0.2] [N/A:N/A] (cm) Area (cm) : [1:28.274] [2:3.927] [N/A:N/A] Volume (cm) : [1:8.482] [2:0.785] [N/A:N/A] % Reduction in Area: [1:-63.60%] [2:66.00%] [N/A:N/A] % Reduction in Volume: -63.60% [2:32.00%] [N/A:N/A] Classification: [1:Full Thickness Without Exposed Support Structures] [  2:Full Thickness Without Exposed Support Structures] [N/A:N/A] Exudate Amount: [1:Large] [2:Large] [N/A:N/A] Exudate Type: [1:Purulent] [2:Serosanguineous] [N/A:N/A] Exudate Color: [1:yellow, brown, green] [2:red, brown] [N/A:N/A] Wound Margin: [1:Indistinct, nonvisible] [2:Indistinct, nonvisible] [N/A:N/A] Granulation Amount: [1:Medium (34-66%)] [2:Medium (34-66%)] [N/A:N/A] Granulation Quality: [1:Red, Pink] [2:Pink] [N/A:N/A] Necrotic Amount: [1:Medium (34-66%)] [2:Medium (34-66%)] [N/A:N/A] Exposed Structures: [1:Fat Layer (Subcutaneous Tissue) Exposed: Yes] [2:Fat Layer (Subcutaneous Tissue) Exposed: Yes] [N/A:N/A] Fascia: No Fascia: No Tendon: No Tendon: No Muscle: No Muscle: No Joint: No Joint: No Bone: No Bone: No Epithelialization: Small (1-33%) None N/A Periwound Skin Texture: Scarring: Yes Scarring: Yes N/A Excoriation: No Excoriation:  No Induration: No Induration: No Callus: No Callus: No Crepitus: No Crepitus: No Rash: No Rash: No Periwound Skin Maceration: Yes Maceration: No N/A Moisture: Dry/Scaly: No Dry/Scaly: No Periwound Skin Color: Ecchymosis: Yes Ecchymosis: Yes N/A Atrophie Blanche: No Atrophie Blanche: No Cyanosis: No Cyanosis: No Erythema: No Erythema: No Hemosiderin Staining: No Hemosiderin Staining: No Mottled: No Mottled: No Pallor: No Pallor: No Rubor: No Rubor: No Temperature: No Abnormality No Abnormality N/A Tenderness on Yes Yes N/A Palpation: Wound Preparation: Ulcer Cleansing: Other: Ulcer Cleansing: Other: N/A soap and water soap and water Topical Anesthetic Topical Anesthetic Applied: None, Other: Applied: None, Other: lidocaine 4% lidocaine 4% Treatment Notes Wound #1 (Right, Lateral Malleolus) 1. Cleansed with: Clean wound with Normal Saline Cleanse wound with antibacterial soap and water 2. Anesthetic Topical Lidocaine 4% cream to wound bed prior to debridement 3. Peri-wound Care: Barrier cream Moisturizing lotion 4. Dressing Applied: Hydrafera Blue 5. Secondary Dressing Applied ABD Pad 7. Secured with Tape 3 Layer Compression System - Right Lower Extremity [name], [name] ([id number]) Notes drawtex, xtrasorb Wound #2 (Right, Medial Malleolus) 1. Cleansed with: Clean wound with Normal Saline Cleanse wound with antibacterial soap and water 2. Anesthetic Topical Lidocaine 4% cream to wound bed prior to debridement 3. Peri-wound Care: Barrier cream Moisturizing lotion 4. Dressing Applied: Hydrafera Blue 5. Secondary Dressing Applied ABD Pad 7. Secured with Tape 3 Layer Compression System - Right Lower Extremity Notes drawtex, xtrasorb Electronic Signature(s) Signed: 02/19/2017 4:49:17 PM By: [name] MD Entered By: [name] on 02/19/2017 08:26:29 [name]  ([id number]) -------------------------------------------------------------------------------- Multi-Disciplinary Care Plan Details Patient Name: [name] Date of Service: 02/19/2017 8:00 AM Medical Record Number: [id number] Patient Account Number: [id number] Date of Birth/Sex: [birth year] (71 y.o. Male) Treating RN: [name] Primary Care Exodus Kutzer: [name] Other Clinician: Referring Jannae Fagerstrom: [name] Treating Felica Chargois/Extender: [name] Weeks in Treatment: 34 Active Inactive ` Venous Leg Ulcer Nursing Diagnoses: Knowledge deficit related to disease process and management Goals: Patient will maintain optimal edema control Date Initiated: 04/03/2016 Target Resolution Date: 11/24/2016 Goal Status: Active Interventions: Compression as ordered Treatment Activities: Therapeutic compression applied : 04/03/2016 Notes: ` Wound/Skin Impairment Nursing Diagnoses: Impaired tissue integrity Goals: Ulcer/skin breakdown will heal within 14 weeks Date Initiated: 01/17/2016 Target Resolution Date: 11/24/2016 Goal Status: Active Interventions: Assess ulceration(s) every visit Notes: Electronic Signature(s) Signed: 02/19/2017 4:41:59 PM By: Aurelio Jew, [name] ([id number]) Entered By: Alejandro Mulling on 02/19/2017 08:21:37 [name] ([id number]) -------------------------------------------------------------------------------- Pain Assessment Details Patient Name: [name] Date of Service: 02/19/2017 8:00 AM Medical Record Number: [id number] Patient Account Number: [id number] Date of Birth/Sex: April 06, 1946 (71 y.o. Male) Treating RN: [name] Primary Care Jonelle Bann: [name] Other Clinician: Referring Humbert Morozov: [name] Treating Brace Welte/Extender: [name] Weeks in Treatment: 57 Active Problems Location of Pain Severity and Description of Pain Patient Has Paino No Site Locations With Dressing  Change: No Pain Management and Medication Current Pain Management: Electronic Signature(s) Signed: 02/19/2017 4:41:59 PM By: Alejandro Mulling Entered By: Alejandro Mulling on 02/19/2017 08:08:15 [name] ([id number]) -------------------------------------------------------------------------------- Patient/Caregiver Education Details [name] Date of Service: 02/19/2017 8:00 AM Patient Name: C. Patient Account Number: [id number] Medical Record Treating RN: [name] [id number] Number: Other Clinician: Date of Birth/Gender: 1946-01-18 (71 y.o. Male) Treating [name], [name] Primary Care Physician: [name] Physician/Extender: [name] Referring Physician: [name] in Treatment: 71 Education Assessment Education Provided To: Patient Education Topics Provided Wound/Skin Impairment: Handouts: Other: change dressing as ordered Methods: Demonstration, Explain/Verbal Responses: State content correctly Electronic Signature(s) Signed: 02/19/2017 4:41:59 PM By: Alejandro Mulling Entered By: Alejandro Mulling on 02/19/2017 08:23:55 [name] ([id number]) -------------------------------------------------------------------------------- Wound Assessment Details Patient Name: [name] Date of Service: 02/19/2017 8:00 AM Medical Record Number: [id number] Patient Account Number: [id number] Date of Birth/Sex: Sep 07, 1945 (71 y.o. Male) Treating RN: Ashok Cordia, [name] Primary Care Mc Hollen: [name] Other Clinician: Referring Tyasia Packard: [name] Treating Krysta Bloomfield/Extender: [name] Weeks in Treatment: 57 Wound Status Wound Number: 1 Primary Venous Leg Ulcer Etiology: Wound Location: Right Malleolus - Lateral Wound Open Wounding Event: Gradually Appeared Status: Date Acquired: 08/11/2015 Comorbid Cataracts, Chronic Obstructive Weeks Of Treatment: 57 History: Pulmonary Disease (COPD), Clustered Wound:  No Osteoarthritis Photos Photo Uploaded By: Alejandro Mulling on 02/19/2017 15:49:21 Wound Measurements Length: (cm) 6 Width: (cm) 6 Depth: (cm) 0.3 Area: (cm) 28.274 Volume: (cm) 8.482 % Reduction in Area: -63.6% % Reduction in Volume: -63.6% Epithelialization: Small (1-33%) Tunneling: No Undermining: No Wound Description Full Thickness Without Exposed Foul Odor After Classification: Support Structures Slough/Fibrino Wound Margin: Indistinct, nonvisible Exudate Large Amount: Exudate Type: Purulent Exudate Color: yellow, brown, green Cleansing: No Yes Wound Bed Granulation Amount: Medium (34-66%) Exposed Structure Granulation Quality: Red, Pink Fascia Exposed: No [name], [name] ([id number]) Necrotic Amount: Medium (34-66%) Fat Layer (Subcutaneous Tissue) Exposed: Yes Necrotic Quality: Adherent Slough Tendon Exposed: No Muscle Exposed: No Joint Exposed: No Bone Exposed: No Periwound Skin Texture Texture Color No Abnormalities Noted: No No Abnormalities Noted: No Callus: No Atrophie Blanche: No Crepitus: No Cyanosis: No Excoriation: No Ecchymosis: Yes Induration: No Erythema: No Rash: No Hemosiderin Staining: No Scarring: Yes Mottled: No Pallor: No Moisture Rubor: No No Abnormalities Noted: No Dry / Scaly: No Temperature / Pain Maceration: Yes Temperature: No Abnormality Tenderness on Palpation: Yes Wound Preparation Ulcer Cleansing: Other: soap and water, Topical Anesthetic Applied: None, Other: lidocaine 4%, Treatment Notes Wound #1 (Right, Lateral Malleolus) 1. Cleansed with: Clean wound with Normal Saline Cleanse wound with antibacterial soap and water 2. Anesthetic Topical Lidocaine 4% cream to wound bed prior to debridement 3. Peri-wound Care: Barrier cream Moisturizing lotion 4. Dressing Applied: Hydrafera Blue 5. Secondary Dressing Applied ABD Pad 7. Secured with Tape 3 Layer Compression System - Right Lower  Extremity Notes drawtex, xtrasorb Electronic Signature(s) Signed: 02/19/2017 4:41:59 PM By: Aurelio Jew, [name] ([id number]) Entered By: Alejandro Mulling on 02/19/2017 08:18:24 [name] ([id number]) -------------------------------------------------------------------------------- Wound Assessment Details Patient Name: [name] Date of Service: 02/19/2017 8:00 AM Medical Record Number: [id number] Patient Account Number: [id number] Date of Birth/Sex: June 23, 1946 (71 y.o. Male) Treating RN: [name] Primary Care Brietta Manso: [name] Other Clinician: Referring Andrius Andrepont: [name] Treating Shaylinn Hladik/Extender: [name] Weeks in Treatment: 57 Wound Status Wound Number: 2 Primary Venous Leg Ulcer Etiology: Wound Location: Right Malleolus - Medial Wound Open Wounding Event: Gradually Appeared Status: Date Acquired: 01/30/2016 Comorbid Cataracts, Chronic Obstructive Weeks Of  Treatment: 55 History: Pulmonary Disease (COPD), Clustered Wound: Yes Osteoarthritis Photos Photo Uploaded By: Alejandro MullingPinkerton, [name] on 02/19/2017 15:50:38 Wound Measurements Length: (cm) 2.5 Width: (cm) 2 Depth: (cm) 0.2 Area: (cm) 3.927 Volume: (cm) 0.785 % Reduction in Area: 66% % Reduction in Volume: 32% Epithelialization: None Tunneling: No Undermining: No Wound Description Full Thickness Without Exposed Foul Odor Afte Classification: Support Structures Slough/Fibrino Wound Margin: Indistinct, nonvisible Exudate Large Amount: Exudate Type: Serosanguineous Exudate Color: red, brown r Cleansing: No Yes Wound Bed Granulation Amount: Medium (34-66%) Exposed Structure Granulation Quality: Pink Fascia Exposed: No [name], [name] ([id number]) Necrotic Amount: Medium (34-66%) Fat Layer (Subcutaneous Tissue) Exposed: Yes Necrotic Quality: Adherent Slough Tendon Exposed: No Muscle Exposed: No Joint Exposed: No Bone Exposed: No Periwound  Skin Texture Texture Color No Abnormalities Noted: No No Abnormalities Noted: No Callus: No Atrophie Blanche: No Crepitus: No Cyanosis: No Excoriation: No Ecchymosis: Yes Induration: No Erythema: No Rash: No Hemosiderin Staining: No Scarring: Yes Mottled: No Pallor: No Moisture Rubor: No No Abnormalities Noted: No Dry / Scaly: No Temperature / Pain Maceration: No Temperature: No Abnormality Tenderness on Palpation: Yes Wound Preparation Ulcer Cleansing: Other: soap and water, Topical Anesthetic Applied: None, Other: lidocaine 4%, Treatment Notes Wound #2 (Right, Medial Malleolus) 1. Cleansed with: Clean wound with Normal Saline Cleanse wound with antibacterial soap and water 2. Anesthetic Topical Lidocaine 4% cream to wound bed prior to debridement 3. Peri-wound Care: Barrier cream Moisturizing lotion 4. Dressing Applied: Hydrafera Blue 5. Secondary Dressing Applied ABD Pad 7. Secured with Tape 3 Layer Compression System - Right Lower Extremity Notes drawtex, xtrasorb Electronic Signature(s) Signed: 02/19/2017 4:41:59 PM By: Aurelio JewPinkerton, [name], [name] ([id number]) Entered By: Alejandro MullingPinkerton, [name] on 02/19/2017 08:18:53 [name] StackBLACKWELL, [name] ([id number]) -------------------------------------------------------------------------------- Vitals Details Patient Name: [name] StackBLACKWELL, [name] Date of Service: 02/19/2017 8:00 AM Medical Record Number: [id number] Patient Account Number: [id number] Date of Birth/Sex: [birth year](70 y.o. Male) Treating RN: Ashok CordiaPinkerton, [name] Primary Care Alfred Eckley: Darreld McleanMILES, LINDA Other Clinician: Referring Duke Weisensel: Darreld McleanMILES, LINDA Treating Marquelle Balow/Extender: Maxwell CaulOBSON, [name] Weeks in Treatment: 57 Vital Signs Time Taken: 08:05 Temperature (F): 98.2 Height (in): 76 Pulse (bpm): 78 Weight (lbs): 238 Respiratory Rate (breaths/min): 18 Body Mass Index (BMI): 29 Blood Pressure (mmHg): 142/68 Reference Range: 80 - 120 mg /  dl Electronic Signature(s) Signed: 02/19/2017 4:41:59 PM By: Alejandro MullingPinkerton, [name] Entered By: Alejandro MullingPinkerton, [name] on 02/19/2017 08:09:43

## 2017-02-22 DIAGNOSIS — I87331 Chronic venous hypertension (idiopathic) with ulcer and inflammation of right lower extremity: Secondary | ICD-10-CM | POA: Diagnosis not present

## 2017-02-25 NOTE — Progress Notes (Signed)
[name], [name] ([id number]) Visit Report for 02/22/2017 Arrival Information Details Patient Name: [name], [name] Date of Service: 02/22/2017 8:00 AM Medical Record Number: [id number] Patient Account Number: [id number] Date of Birth/Sex: 1945/11/12 29(70 y.o. Male) Treating RN: Phillis [name], [name] Primary Care [name]: [name] McleanMILES, [name] Other Clinician: Referring Georgene Kopper: [name] McleanMILES, [name] Treating Damali Broadfoot/Extender: [name], [name] in Treatment: 2957 Visit Information History Since Last Visit All ordered tests and consults were completed: No Patient Arrived: Ambulatory Added or deleted any medications: No Arrival Time: 08:02 Any new allergies or adverse reactions: No Accompanied By: self Had a fall or experienced change in No Transfer Assistance: None activities of daily living that may affect Patient Identification Verified: Yes risk of falls: Secondary Verification Process Yes Signs or symptoms of abuse/neglect since last No Completed: visito Patient Requires Transmission-Based No Hospitalized since last visit: No Precautions: Has Dressing in Place as Prescribed: Yes Patient Has Alerts: No Has Compression in Place as Prescribed: Yes Pain Present Now: No Electronic Signature(s) Signed: 02/22/2017 4:49:46 PM By: [name], [name] Entered By: [name], [name] on 02/22/2017 08:02:26 [name], [name] ([id number]) -------------------------------------------------------------------------------- Encounter Discharge Information Details Patient Name: [name], [name] Date of Service: 02/22/2017 8:00 AM Medical Record Number: [id number] Patient Account Number: [id number] Date of Birth/Sex: 1945/11/12 47(70 y.o. Male) Treating RN: Phillis [name], [name] Primary Care Sahmir Weatherbee: [name] McleanMILES, [name] Other Clinician: Referring Stefany Starace: [name] McleanMILES, [name] Treating Charlee Whitebread/Extender: [name], [name] in Treatment: 2057 Encounter Discharge Information Items Discharge Pain Level: 0 Discharge Condition:  Stable Ambulatory Status: Ambulatory Discharge Destination: Home Private Transportation: Auto Accompanied By: self Schedule Follow-up Appointment: Yes Medication Reconciliation completed and No provided to Patient/Care Ithzel Fedorchak: Clinical Summary of Care: Electronic Signature(s) Signed: 02/22/2017 4:49:46 PM By: [name], [name] Entered By: [name], [name] on 02/22/2017 08:00:09 [name], [name] ([id number]) -------------------------------------------------------------------------------- Patient/Caregiver Education Details Patient Name: [name], [name] Date of Service: 02/22/2017 8:00 AM Medical Record Number: [id number] Patient Account Number: [id number] Date of Birth/Gender: 1945/11/12 [age] y.o. Male) Treating RN: Phillis [name], [name] Primary Care Physician: [name] McleanMILES, [name] Other Clinician: Referring Physician: Darreld McleanMILES, [name] Treating Physician/Extender: [name], [name] in Treatment: 6757 Education Assessment Education Provided To: Patient Education Topics Provided Wound/Skin Impairment: Handouts: Other: do not get dressing wet Methods: Demonstration, Explain/Verbal Responses: State content correctly Electronic Signature(s) Signed: 02/22/2017 4:49:46 PM By: [name], [name] Entered By: [name], [name] on 02/22/2017 07:59:55 [name], [name] ([id number]) -------------------------------------------------------------------------------- Wound Assessment Details Patient Name: [name], [name] Date of Service: 02/22/2017 8:00 AM Medical Record Number: [id number] Patient Account Number: [id number] Date of Birth/Sex: 1945/11/12 48(70 y.o. Male) Treating RN: Ashok CordiaPinkerton, [name] Primary Care Worley Radermacher: [name] McleanMILES, [name] Other Clinician: Referring Ceara Wrightson: [name] McleanMILES, [name] Treating Leitha Hyppolite/Extender: [name], [name] in Treatment: 57 Wound Status Wound Number: 1 Primary Venous Leg Ulcer Etiology: Wound Location: Right Malleolus - Lateral Wound Open Wounding Event: Gradually  Appeared Status: Date Acquired: 08/11/2015 Comorbid Cataracts, Chronic Obstructive [name] Of Treatment: 57 History: Pulmonary Disease (COPD), Clustered Wound: No Osteoarthritis Photos Photo Uploaded By: [name], [name] on 02/22/2017 08:56:05 Wound Measurements Length: (cm) 6 Width: (cm) 6 Depth: (cm) 0.3 Area: (cm) 28.274 Volume: (cm) 8.482 % Reduction in Area: -63.6% % Reduction in Volume: -63.6% Epithelialization: Small (1-33%) Tunneling: No Undermining: No Wound Description Full Thickness Without Exposed Foul Odor After Classification: Support Structures Slough/Fibrino Wound Margin: Indistinct, nonvisible Exudate Large Amount: Exudate Type: Purulent Exudate Color: yellow, brown, green Cleansing: No Yes Wound Bed Granulation Amount: Medium (34-66%) Exposed Structure Granulation Quality: Red, Pink Fascia Exposed: No [name], [name] ([id number]) Necrotic Amount: Medium (34-66%) Fat Layer (Subcutaneous Tissue) Exposed: Yes Necrotic  Quality: Adherent Slough Tendon Exposed: No Muscle Exposed: No Joint Exposed: No Bone Exposed: No Periwound Skin Texture Texture Color No Abnormalities Noted: No No Abnormalities Noted: No Callus: No Atrophie Blanche: No Crepitus: No Cyanosis: No Excoriation: No Ecchymosis: Yes Induration: No Erythema: No Rash: No Hemosiderin Staining: No Scarring: Yes Mottled: No Pallor: No Moisture Rubor: No No Abnormalities Noted: No Dry / Scaly: No Temperature / Pain Maceration: Yes Temperature: No Abnormality Tenderness on Palpation: Yes Wound Preparation Ulcer Cleansing: Other: soap and water, Topical Anesthetic Applied: None, Other: lidocaine 4%, Treatment Notes Wound #1 (Right, Lateral Malleolus) 1. Cleansed with: Clean wound with Normal Saline Cleanse wound with antibacterial soap and water 3. Peri-wound Care: Barrier cream Moisturizing lotion 4. Dressing Applied: Hydrafera Blue 5. Secondary Dressing  Applied ABD Pad 7. Secured with Tape 3 Layer Compression System - Right Lower Extremity Notes xtrasorb, drawtex, unna to anchor Electronic Signature(s) Signed: 02/22/2017 4:49:46 PM By: [name] Mulling Entered By: [name] Mulling on 02/22/2017 07:56:12 [name] Stack ([id number]) [name], [name] ([id number]) -------------------------------------------------------------------------------- Wound Assessment Details Patient Name: [name] Stack Date of Service: 02/22/2017 8:00 AM Medical Record Number: [id number] Patient Account Number: [id number] Date of Birth/Sex: 1946-05-19 (71 y.o. Male) Treating RN: Ashok Cordia, [name] Primary Care Keelin Sheridan: [name] Other Clinician: Referring Uniqua Kihn: [name] Treating Lillian Ballester/Extender: [name] Re in Treatment: 57 Wound Status Wound Number: 2 Primary Venous Leg Ulcer Etiology: Wound Location: Right Malleolus - Medial Wound Open Wounding Event: Gradually Appeared Status: Date Acquired: 01/30/2016 Comorbid Cataracts, Chronic Obstructive [name] Of Treatment: 55 History: Pulmonary Disease (COPD), Clustered Wound: Yes Osteoarthritis Photos Photo Uploaded By: [name] Mulling on 02/22/2017 08:56:06 Wound Measurements Length: (cm) 2.5 Width: (cm) 2 Depth: (cm) 0.2 Area: (cm) 3.927 Volume: (cm) 0.785 % Reduction in Area: 66% % Reduction in Volume: 32% Epithelialization: None Tunneling: No Undermining: No Wound Description Full Thickness Without Exposed Classification: Support Structures Wound Margin: Indistinct, nonvisible Exudate Large Amount: Exudate Type: Serosanguineous Exudate Color: red, brown Foul Odor After Cleansing: No Slough/Fibrino Yes Wound Bed Granulation Amount: Medium (34-66%) Exposed Structure Granulation Quality: Pink Fascia Exposed: No Cowles, Emari C. ([id number]) Necrotic Amount: Medium (34-66%) Fat Layer (Subcutaneous Tissue) Exposed: Yes Necrotic Quality: Adherent  Slough Tendon Exposed: No Muscle Exposed: No Joint Exposed: No Bone Exposed: No Periwound Skin Texture Texture Color No Abnormalities Noted: No No Abnormalities Noted: No Callus: No Atrophie Blanche: No Crepitus: No Cyanosis: No Excoriation: No Ecchymosis: Yes Induration: No Erythema: No Rash: No Hemosiderin Staining: No Scarring: Yes Mottled: No Pallor: No Moisture Rubor: No No Abnormalities Noted: No Dry / Scaly: No Temperature / Pain Maceration: No Temperature: No Abnormality Tenderness on Palpation: Yes Wound Preparation Ulcer Cleansing: Other: soap and water, Topical Anesthetic Applied: None, Other: lidocaine 4%, Treatment Notes Wound #2 (Right, Medial Malleolus) 1. Cleansed with: Clean wound with Normal Saline Cleanse wound with antibacterial soap and water 3. Peri-wound Care: Barrier cream Moisturizing lotion 4. Dressing Applied: Hydrafera Blue 5. Secondary Dressing Applied ABD Pad 7. Secured with Tape 3 Layer Compression System - Right Lower Extremity Notes xtrasorb, drawtex, unna to anchor Electronic Signature(s) Signed: 02/22/2017 4:49:46 PM By: [name] Mulling Entered By: [name] Mulling on 02/22/2017 07:56:30

## 2017-02-26 ENCOUNTER — Encounter: Payer: Medicare HMO | Attending: Internal Medicine | Admitting: Internal Medicine

## 2017-02-26 DIAGNOSIS — Y832 Surgical operation with anastomosis, bypass or graft as the cause of abnormal reaction of the patient, or of later complication, without mention of misadventure at the time of the procedure: Secondary | ICD-10-CM | POA: Insufficient documentation

## 2017-02-26 DIAGNOSIS — L97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 of skin: Secondary | ICD-10-CM | POA: Diagnosis not present

## 2017-02-26 DIAGNOSIS — T8131XD Disruption of external operation (surgical) wound, not elsewhere classified, subsequent encounter: Secondary | ICD-10-CM | POA: Insufficient documentation

## 2017-02-26 DIAGNOSIS — I87331 Chronic venous hypertension (idiopathic) with ulcer and inflammation of right lower extremity: Secondary | ICD-10-CM | POA: Insufficient documentation

## 2017-02-26 DIAGNOSIS — L97212 Non-pressure chronic ulcer of right calf with fat layer exposed: Secondary | ICD-10-CM | POA: Diagnosis not present

## 2017-02-26 DIAGNOSIS — Z86718 Personal history of other venous thrombosis and embolism: Secondary | ICD-10-CM | POA: Diagnosis not present

## 2017-02-26 DIAGNOSIS — T85613A 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, initial encounter: Secondary | ICD-10-CM | POA: Insufficient documentation

## 2017-02-26 DIAGNOSIS — Z7901 Long term (current) use of anticoagulants: Secondary | ICD-10-CM | POA: Insufficient documentation

## 2017-03-01 DIAGNOSIS — I87331 Chronic venous hypertension (idiopathic) with ulcer and inflammation of right lower extremity: Secondary | ICD-10-CM | POA: Diagnosis not present

## 2017-03-02 NOTE — Progress Notes (Signed)
TAIT, BALISTRERI ([id number]) Visit Report for 02/26/2017 Arrival Information Details Patient Name: [name], [name]. Date of Service: 02/26/2017 8:00 AM Medical Record Number: [id number] Patient Account Number: [id number] Date of Birth/Sex: 11-28-1945 (71 y.o. Male) Treating RN: [name] Primary Care Abigaile Rossie: [name] Other Clinician: Referring Hosie Sharman: [name] Treating Mario Coronado/Extender: Altamese Paxton in Treatment: 58 Visit Information History Since Last Visit All ordered tests and consults were completed: No Patient Arrived: Ambulatory Added or deleted any medications: No Arrival Time: 07:58 Any new allergies or adverse reactions: No Accompanied By: self Had a fall or experienced change in No Transfer Assistance: None activities of daily living that may affect Patient Identification Verified: Yes risk of falls: Secondary Verification Process Yes Signs or symptoms of abuse/neglect since last No Completed: visito Patient Requires Transmission-Based No Hospitalized since last visit: No Precautions: Has Dressing in Place as Prescribed: Yes Patient Has Alerts: No Has Compression in Place as Prescribed: Yes Pain Present Now: No Electronic Signature(s) Signed: 03/01/2017 4:17:34 PM By: [name] Mulling Entered By: [name] Mulling on 02/26/2017 07:58:40 [name] ([id number]) -------------------------------------------------------------------------------- Encounter Discharge Information Details Patient Name: [name] Date of Service: 02/26/2017 8:00 AM Medical Record Number: [id number] Patient Account Number: [id number] Date of Birth/Sex: Mar 01, 1946 (71 y.o. Male) Treating RN: [name] Primary Care Korrine Sicard: [name] Other Clinician: Referring Sugar Vanzandt: [name] Treating Vernestine Brodhead/Extender: [name] Weeks in Treatment: 37 Encounter Discharge Information Items Discharge Pain Level: 0 Discharge  Condition: Stable Ambulatory Status: Ambulatory Discharge Destination: Home Transportation: Private Auto Accompanied By: self Schedule Follow-up Appointment: Yes Medication Reconciliation completed No and provided to Patient/Care Terris Germano: Patient Clinical Summary of Care: Declined Electronic Signature(s) Signed: 03/01/2017 4:17:34 PM By: [name] Mulling Previous Signature: 02/26/2017 8:49:24 AM Version By: Gwenlyn Perking Entered By: [name] Mulling on 02/26/2017 09:10:41 [name] ([id number]) -------------------------------------------------------------------------------- Lower Extremity Assessment Details Patient Name: [name] Date of Service: 02/26/2017 8:00 AM Medical Record Number: [id number] Patient Account Number: [id number] Date of Birth/Sex: [birth year] (71 y.o. Male) Treating RN: [name] Primary Care Kavontae Pritchard: [name] Other Clinician: Referring Jeno Calleros: [name] Treating Fabian Coca/Extender: [name] Weeks in Treatment: 58 Vascular Assessment Pulses: Dorsalis Pedis Palpable: [Right:Yes] Posterior Tibial Extremity colors, hair growth, and conditions: Extremity Color: [Right:Normal] Temperature of Extremity: [Right:Warm] Capillary Refill: [Right:< 3 seconds] Toe Nail Assessment Left: Right: Thick: Yes Discolored: Yes Deformed: Yes Improper Length and Hygiene: No Electronic Signature(s) Signed: 03/01/2017 4:17:34 PM By: [name] Mulling Entered By: [name] Mulling on 02/26/2017 08:07:27 [name] ([id number]) -------------------------------------------------------------------------------- Multi Wound Chart Details Patient Name: [name] Date of Service: 02/26/2017 8:00 AM Medical Record Number: [id number] Patient Account Number: [id number] Date of Birth/Sex: [birth year] (71 y.o. Male) Treating RN: Ashok Cordia, [name] Primary Care Maika Kaczmarek: [name] Other Clinician: Referring Fantashia Shupert: [name] Treating Pedro Oldenburg/Extender: [name] Weeks in Treatment: 58 Vital Signs Height(in): 76 Pulse(bpm): 80 Weight(lbs): 238 Blood Pressure 150/75 (mmHg): Body Mass Index(BMI): 29 Temperature(F): 97.6 Respiratory Rate 18 (breaths/min): Photos: [1:No Photos] [2:No Photos] [N/A:N/A] Wound Location: [1:Right Malleolus - Lateral] [2:Right Malleolus - Medial] [N/A:N/A] Wounding Event: [1:Gradually Appeared] [2:Gradually Appeared] [N/A:N/A] Primary Etiology: [1:Venous Leg Ulcer] [2:Venous Leg Ulcer] [N/A:N/A] Comorbid History: [1:Cataracts, Chronic Obstructive Pulmonary Disease (COPD), Osteoarthritis] [2:Cataracts, Chronic Obstructive Pulmonary Disease (COPD), Osteoarthritis] [N/A:N/A] Date Acquired: [1:08/11/2015] [2:01/30/2016] [N/A:N/A] Weeks of Treatment: [1:58] [2:56] [N/A:N/A] Wound Status: [1:Open] [2:Open] [N/A:N/A] Clustered Wound: [1:No] [2:Yes] [N/A:N/A] Measurements L x W x D 6x6x0.3 [2:2.5x1.8x0.2] [N/A:N/A] (cm) Area (cm) : [1:28.274] [2:3.534] [N/A:N/A] Volume (cm) : [  1:8.482] [2:0.707] [N/A:N/A] % Reduction in Area: [1:-63.60%] [2:69.40%] [N/A:N/A] % Reduction in Volume: -63.60% [2:38.80%] [N/A:N/A] Classification: [1:Full Thickness Without Exposed Support Structures] [2:Full Thickness Without Exposed Support Structures] [N/A:N/A] Exudate Amount: [1:Large] [2:Large] [N/A:N/A] Exudate Type: [1:Purulent] [2:Serosanguineous] [N/A:N/A] Exudate Color: [1:yellow, brown, green] [2:red, brown] [N/A:N/A] Wound Margin: [1:Indistinct, nonvisible] [2:Indistinct, nonvisible] [N/A:N/A] Granulation Amount: [1:Medium (34-66%)] [2:Medium (34-66%)] [N/A:N/A] Granulation Quality: [1:Red, Pink] [2:Pink] [N/A:N/A] Necrotic Amount: [1:Medium (34-66%)] [2:Medium (34-66%)] [N/A:N/A] Exposed Structures: [1:Fat Layer (Subcutaneous Tissue) Exposed: Yes] [2:Fat Layer (Subcutaneous Tissue) Exposed: Yes] [N/A:N/A] Fascia: No Fascia: No Tendon: No Tendon: No Muscle: No Muscle:  No Joint: No Joint: No Bone: No Bone: No Epithelialization: Small (1-33%) Small (1-33%) N/A Periwound Skin Texture: Scarring: Yes Scarring: Yes N/A Excoriation: No Excoriation: No Induration: No Induration: No Callus: No Callus: No Crepitus: No Crepitus: No Rash: No Rash: No Periwound Skin Maceration: Yes Maceration: No N/A Moisture: Dry/Scaly: No Dry/Scaly: No Periwound Skin Color: Ecchymosis: Yes Ecchymosis: Yes N/A Atrophie Blanche: No Atrophie Blanche: No Cyanosis: No Cyanosis: No Erythema: No Erythema: No Hemosiderin Staining: No Hemosiderin Staining: No Mottled: No Mottled: No Pallor: No Pallor: No Rubor: No Rubor: No Temperature: No Abnormality No Abnormality N/A Tenderness on Yes Yes N/A Palpation: Wound Preparation: Ulcer Cleansing: Other: Ulcer Cleansing: Other: N/A soap and water soap and water Topical Anesthetic Topical Anesthetic Applied: None, Other: Applied: None, Other: lidocaine 4% lidocaine 4% Treatment Notes Electronic Signature(s) Signed: 02/26/2017 5:35:48 PM By: [name] MD Entered By: [name] on 02/26/2017 08:28:09 [name] ([id number]) -------------------------------------------------------------------------------- Multi-Disciplinary Care Plan Details Patient Name: [name] Date of Service: 02/26/2017 8:00 AM Medical Record Number: [id number] Patient Account Number: [id number] Date of Birth/Sex: 07-12-1946 (71 y.o. Male) Treating RN: [name] Primary Care Marguetta Windish: [name] Other Clinician: Referring Aimi Essner: [name] Treating Elner Seifert/Extender: [name] Weeks in Treatment: 26 Active Inactive ` Venous Leg Ulcer Nursing Diagnoses: Knowledge deficit related to disease process and management Goals: Patient will maintain optimal edema control Date Initiated: 04/03/2016 Target Resolution Date: 11/24/2016 Goal Status: Active Interventions: Compression as ordered Treatment  Activities: Therapeutic compression applied : 04/03/2016 Notes: ` Wound/Skin Impairment Nursing Diagnoses: Impaired tissue integrity Goals: Ulcer/skin breakdown will heal within 14 weeks Date Initiated: 01/17/2016 Target Resolution Date: 11/24/2016 Goal Status: Active Interventions: Assess ulceration(s) every visit Notes: Electronic Signature(s) Signed: 03/01/2017 4:17:34 PM By: Aurelio Jew, [name] ([id number]) Entered By: [name] Mulling on 02/26/2017 08:09:13 [name] ([id number]) -------------------------------------------------------------------------------- Pain Assessment Details Patient Name: [name] Date of Service: 02/26/2017 8:00 AM Medical Record Number: [id number] Patient Account Number: [id number] Date of Birth/Sex: 12-30-1945 (71 y.o. Male) Treating RN: [name] Primary Care Skylee Baird: [name] Other Clinician: Referring Sanoe Hazan: [name] Treating Leeland Lovelady/Extender: [name] Weeks in Treatment: 58 Active Problems Location of Pain Severity and Description of Pain Patient Has Paino No Site Locations With Dressing Change: No Pain Management and Medication Current Pain Management: Electronic Signature(s) Signed: 03/01/2017 4:17:34 PM By: [name] Mulling Entered By: [name] Mulling on 02/26/2017 07:58:47 [name] ([id number]) -------------------------------------------------------------------------------- Patient/Caregiver Education Details [name] Date of Service: 02/26/2017 8:00 AM Patient Name: C. Patient Account Number: [id number] Medical Record Treating RN: [name] [id number] Number: Other Clinician: Date of Birth/Gender: 16-Mar-1946 (71 y.o. Male) Treating [name], [name] Primary Care Physician: [name] Physician/Extender: G Referring Physician: [name] in Treatment: 61 Education Assessment Education Provided To: Patient Education Topics  Provided Wound/Skin Impairment: Handouts: Other: change dressing as ordered Methods: Demonstration, Explain/Verbal Responses: State content correctly Electronic  Signature(s) Signed: 03/01/2017 4:17:34 PM By: [name] Mulling Entered By: [name] Mulling on 02/26/2017 09:10:52 [name] ([id number]) -------------------------------------------------------------------------------- Wound Assessment Details Patient Name: [name] Date of Service: 02/26/2017 8:00 AM Medical Record Number: [id number] Patient Account Number: [id number] Date of Birth/Sex: 1946/03/08 (71 y.o. Male) Treating RN: Ashok Cordia, [name] Primary Care Joclyn Alsobrook: [name] Other Clinician: Referring Jevante Hollibaugh: [name] Treating Keli Buehner/Extender: [name] Weeks in Treatment: 58 Wound Status Wound Number: 1 Primary Venous Leg Ulcer Etiology: Wound Location: Right Malleolus - Lateral Wound Open Wounding Event: Gradually Appeared Status: Date Acquired: 08/11/2015 Comorbid Cataracts, Chronic Obstructive Weeks Of Treatment: 58 History: Pulmonary Disease (COPD), Clustered Wound: No Osteoarthritis Photos Photo Uploaded By: [name] Mulling on 02/26/2017 10:51:35 Wound Measurements Length: (cm) 6 Width: (cm) 6 Depth: (cm) 0.3 Area: (cm) 28.274 Volume: (cm) 8.482 % Reduction in Area: -63.6% % Reduction in Volume: -63.6% Epithelialization: Small (1-33%) Tunneling: No Undermining: No Wound Description Full Thickness Without Exposed Foul Odor After Classification: Support Structures Slough/Fibrino Wound Margin: Indistinct, nonvisible Exudate Large Amount: Exudate Type: Purulent Exudate Color: yellow, brown, green Cleansing: No Yes Wound Bed Granulation Amount: Medium (34-66%) Exposed Structure Granulation Quality: Red, Pink Fascia Exposed: No [name], [name] ([id number]) Necrotic Amount: Medium (34-66%) Fat Layer (Subcutaneous Tissue) Exposed: Yes Necrotic  Quality: Adherent Slough Tendon Exposed: No Muscle Exposed: No Joint Exposed: No Bone Exposed: No Periwound Skin Texture Texture Color No Abnormalities Noted: No No Abnormalities Noted: No Callus: No Atrophie Blanche: No Crepitus: No Cyanosis: No Excoriation: No Ecchymosis: Yes Induration: No Erythema: No Rash: No Hemosiderin Staining: No Scarring: Yes Mottled: No Pallor: No Moisture Rubor: No No Abnormalities Noted: No Dry / Scaly: No Temperature / Pain Maceration: Yes Temperature: No Abnormality Tenderness on Palpation: Yes Wound Preparation Ulcer Cleansing: Other: soap and water, Topical Anesthetic Applied: None, Other: lidocaine 4%, Electronic Signature(s) Signed: 03/01/2017 4:17:34 PM By: [name] Mulling Entered By: [name] Mulling on 02/26/2017 08:06:28 [name] ([id number]) -------------------------------------------------------------------------------- Wound Assessment Details Patient Name: [name] Date of Service: 02/26/2017 8:00 AM Medical Record Number: [id number] Patient Account Number: [id number] Date of Birth/Sex: 05/27/1946 (71 y.o. Male) Treating RN: Ashok Cordia, [name] Primary Care Tiyona Desouza: [name] Other Clinician: Referring Tayelor Osborne: [name] Treating Tyronn Golda/Extender: [name] Weeks in Treatment: 58 Wound Status Wound Number: 2 Primary Venous Leg Ulcer Etiology: Wound Location: Right Malleolus - Medial Wound Open Wounding Event: Gradually Appeared Status: Date Acquired: 01/30/2016 Comorbid Cataracts, Chronic Obstructive Weeks Of Treatment: 56 History: Pulmonary Disease (COPD), Clustered Wound: Yes Osteoarthritis Photos Photo Uploaded By: [name] Mulling on 02/26/2017 10:51:36 Wound Measurements Length: (cm) 2.5 Width: (cm) 1.8 Depth: (cm) 0.2 Area: (cm) 3.534 Volume: (cm) 0.707 % Reduction in Area: 69.4% % Reduction in Volume: 38.8% Epithelialization: Small (1-33%) Tunneling:  No Undermining: No Wound Description Full Thickness Without Exposed Foul Odor Afte Classification: Support Structures Slough/Fibrino Wound Margin: Indistinct, nonvisible Exudate Large Amount: Exudate Type: Serosanguineous Exudate Color: red, brown r Cleansing: No Yes Wound Bed Granulation Amount: Medium (34-66%) Exposed Structure Granulation Quality: Pink Fascia Exposed: No [name], [name] ([id number]) Necrotic Amount: Medium (34-66%) Fat Layer (Subcutaneous Tissue) Exposed: Yes Necrotic Quality: Adherent Slough Tendon Exposed: No Muscle Exposed: No Joint Exposed: No Bone Exposed: No Periwound Skin Texture Texture Color No Abnormalities Noted: No No Abnormalities Noted: No Callus: No Atrophie Blanche: No Crepitus: No Cyanosis: No Excoriation: No Ecchymosis: Yes Induration: No Erythema: No Rash: No Hemosiderin Staining: No Scarring: Yes Mottled: No Pallor: No Moisture Rubor: No No Abnormalities Noted: No Dry / Scaly:  No Temperature / Pain Maceration: No Temperature: No Abnormality Tenderness on Palpation: Yes Wound Preparation Ulcer Cleansing: Other: soap and water, Topical Anesthetic Applied: None, Other: lidocaine 4%, Electronic Signature(s) Signed: 03/01/2017 4:17:34 PM By: [name] MullingPinkerton, Debra Entered By: [name] MullingPinkerton, Debra on 02/26/2017 08:07:05 [name], [name] ([id number]) -------------------------------------------------------------------------------- Vitals Details Patient Name: [name], [name] Date of Service: 02/26/2017 8:00 AM Medical Record Number: [id number] Patient Account Number: [id number] Date of Birth/Sex: Feb 19, 1946 [age] y.o. Male) Treating RN: Ashok CordiaPinkerton, [name] Primary Care Tomer Chalmers: Darreld McleanMILES, LINDA Other Clinician: Referring Oleda Borski: Darreld McleanMILES, LINDA Treating Lue Sykora/Extender: Maxwell CaulOBSON, [name] G Weeks in Treatment: 58 Vital Signs Time Taken: 07:58 Temperature (F): 97.6 Height (in): 76 Pulse (bpm): 80 Weight (lbs):  238 Respiratory Rate (breaths/min): 18 Body Mass Index (BMI): 29 Blood Pressure (mmHg): 150/75 Reference Range: 80 - 120 mg / dl Electronic Signature(s) Signed: 03/01/2017 4:17:34 PM By: [name] MullingPinkerton, Debra Entered By: [name] MullingPinkerton, Debra on 02/26/2017 08:00:22

## 2017-03-02 NOTE — Progress Notes (Signed)
[name], Tien C. ([id number]) Visit Report for 03/01/2017 Arrival Information Details Patient Name: [name], [name] Date of Service: 03/01/2017 8:00 AM Medical Record Number: [id number] Patient Account Number: [id number] Date of Birth/Sex: 01-11-1946 3(70 y.o. Male) Treating RN: Ashok CordiaPinkerton, [name] Primary Care Agastya Meister: Darreld McleanMILES, LINDA Other Clinician: Referring Jayden Kratochvil: Darreld McleanMILES, LINDA Treating Yamilee Harmes/Extender: STONE III, HOYT Weeks in Treatment: 58 Visit Information History Since Last Visit All ordered tests and consults were completed: No Patient Arrived: Ambulatory Added or deleted any medications: No Arrival Time: 08:00 Any new allergies or adverse reactions: No Accompanied By: self Had a fall or experienced change in No Transfer Assistance: None activities of daily living that may affect Patient Identification Verified: Yes risk of falls: Secondary Verification Process Yes Signs or symptoms of abuse/neglect since last No Completed: visito Patient Requires Transmission-Based No Hospitalized since last visit: No Precautions: Has Dressing in Place as Prescribed: Yes Patient Has Alerts: No Has Compression in Place as Prescribed: Yes Pain Present Now: No Electronic Signature(s) Signed: 03/01/2017 4:17:34 PM By: Alejandro MullingPinkerton, Debra Entered By: Alejandro MullingPinkerton, Debra on 03/01/2017 08:10:30 [name], [name] ([id number]) -------------------------------------------------------------------------------- Encounter Discharge Information Details Patient Name: [name], [name] Date of Service: 03/01/2017 8:00 AM Medical Record Number: [id number] Patient Account Number: [id number] Date of Birth/Sex: 01-11-1946 [age] y.o. Male) Treating RN: Phillis HaggisPinkerton, [name] Primary Care Neesa Knapik: Darreld McleanMILES, LINDA Other Clinician: Referring Shiah Berhow: Darreld McleanMILES, LINDA Treating Darlyne Schmiesing/Extender: STONE III, HOYT Weeks in Treatment: 4158 Encounter Discharge Information Items Discharge Pain Level: 0 Discharge Condition:  Stable Ambulatory Status: Ambulatory Discharge Destination: Home Private Transportation: Auto Accompanied By: self Schedule Follow-up Appointment: Yes Medication Reconciliation completed and No provided to Patient/Care Catie Chiao: Clinical Summary of Care: Electronic Signature(s) Signed: 03/01/2017 4:17:34 PM By: Alejandro MullingPinkerton, Debra Entered By: Alejandro MullingPinkerton, Debra on 03/01/2017 08:12:44 [name], [name] ([id number]) -------------------------------------------------------------------------------- Patient/Caregiver Education Details Patient Name: [name], [name] Date of Service: 03/01/2017 8:00 AM Medical Record Number: [id number] Patient Account Number: [id number] Date of Birth/Gender: 01-11-1946 [age] y.o. Male) Treating RN: Phillis HaggisPinkerton, [name] Primary Care Physician: Darreld McleanMILES, LINDA Other Clinician: Referring Physician: Darreld McleanMILES, LINDA Treating Physician/Extender: Linwood DibblesSTONE III, HOYT Weeks in Treatment: 3358 Education Assessment Education Provided To: Patient Education Topics Provided Wound/Skin Impairment: Handouts: Other: do not get wrap wet Methods: Demonstration, Explain/Verbal Responses: State content correctly Electronic Signature(s) Signed: 03/01/2017 4:17:34 PM By: Alejandro MullingPinkerton, Debra Entered By: Alejandro MullingPinkerton, Debra on 03/01/2017 08:12:30 [name], [name] ([id number]) -------------------------------------------------------------------------------- Wound Assessment Details Patient Name: [name], [name] Date of Service: 03/01/2017 8:00 AM Medical Record Number: [id number] Patient Account Number: [id number] Date of Birth/Sex: 01-11-1946 [age] y.o. Male) Treating RN: Ashok CordiaPinkerton, [name] Primary Care Sharonna Vinje: Darreld McleanMILES, LINDA Other Clinician: Referring Clarence Cogswell: Darreld McleanMILES, LINDA Treating Knoxx Boeding/Extender: STONE III, HOYT Weeks in Treatment: 58 Wound Status Wound Number: 1 Primary Venous Leg Ulcer Etiology: Wound Location: Right Malleolus - Lateral Wound Open Wounding Event: Gradually  Appeared Status: Date Acquired: 08/11/2015 Comorbid Cataracts, Chronic Obstructive Weeks Of Treatment: 58 History: Pulmonary Disease (COPD), Clustered Wound: No Osteoarthritis Photos Photo Uploaded By: Alejandro MullingPinkerton, Debra on 03/01/2017 08:50:16 Wound Measurements Length: (cm) 6 Width: (cm) 6 Depth: (cm) 0.3 Area: (cm) 28.274 Volume: (cm) 8.482 % Reduction in Area: -63.6% % Reduction in Volume: -63.6% Epithelialization: Small (1-33%) Tunneling: No Undermining: No Wound Description Full Thickness Without Exposed Foul Odor After Classification: Support Structures Slough/Fibrino Wound Margin: Indistinct, nonvisible Exudate Large Amount: Exudate Type: Purulent Exudate Color: yellow, brown, green Cleansing: No Yes Wound Bed Granulation Amount: Medium (34-66%) Exposed Structure Granulation Quality: Red, Pink Fascia Exposed: No Chapdelaine, [name] C. ([id number]) Necrotic Amount: Medium (34-66%) Fat Layer (Subcutaneous  Tissue) Exposed: Yes Necrotic Quality: Adherent Slough Tendon Exposed: No Muscle Exposed: No Joint Exposed: No Bone Exposed: No Periwound Skin Texture Texture Color No Abnormalities Noted: No No Abnormalities Noted: No Callus: No Atrophie Blanche: No Crepitus: No Cyanosis: No Excoriation: No Ecchymosis: Yes Induration: No Erythema: No Rash: No Hemosiderin Staining: No Scarring: Yes Mottled: No Pallor: No Moisture Rubor: No No Abnormalities Noted: No Dry / Scaly: No Temperature / Pain Maceration: Yes Temperature: No Abnormality Tenderness on Palpation: Yes Wound Preparation Ulcer Cleansing: Other: soap and water, Topical Anesthetic Applied: None, Other: lidocaine 4%, Treatment Notes Wound #1 (Right, Lateral Malleolus) 1. Cleansed with: Clean wound with Normal Saline Cleanse wound with antibacterial soap and water 2. Anesthetic Topical Lidocaine 4% cream to wound bed prior to debridement 4. Dressing Applied: Hydrafera Blue 5.  Secondary Dressing Applied ABD Pad 7. Secured with Tape 3 Layer Compression System - Right Lower Extremity Notes xtrasorb, drawtex, unna to anchor Electronic Signature(s) Signed: 03/01/2017 4:17:34 PM By: Alejandro Mulling Entered By: Alejandro Mulling on 03/01/2017 08:11:06 [name] Stack ([id number]) [name], [name] ([id number]) -------------------------------------------------------------------------------- Wound Assessment Details Patient Name: [name] Stack Date of Service: 03/01/2017 8:00 AM Medical Record Number: [id number] Patient Account Number: [id number] Date of Birth/Sex: 07-31-1946 (71 y.o. Male) Treating RN: Ashok Cordia, [name] Primary Care Jacqueline Delapena: [name] Other Clinician: Referring Raymon Schlarb: [name] Treating Westlyn Glaza/Extender: STONE III, HOYT Weeks in Treatment: 58 Wound Status Wound Number: 2 Primary Venous Leg Ulcer Etiology: Wound Location: Right Malleolus - Medial Wound Open Wounding Event: Gradually Appeared Status: Date Acquired: 01/30/2016 Comorbid Cataracts, Chronic Obstructive Weeks Of Treatment: 56 History: Pulmonary Disease (COPD), Clustered Wound: Yes Osteoarthritis Photos Photo Uploaded By: Alejandro Mulling on 03/01/2017 08:50:17 Wound Measurements Length: (cm) 2.5 Width: (cm) 1.8 Depth: (cm) 0.2 Area: (cm) 3.534 Volume: (cm) 0.707 % Reduction in Area: 69.4% % Reduction in Volume: 38.8% Epithelialization: Small (1-33%) Tunneling: No Undermining: No Wound Description Full Thickness Without Exposed Classification: Support Structures Wound Margin: Indistinct, nonvisible Exudate Large Amount: Exudate Type: Serosanguineous Exudate Color: red, brown Foul Odor After Cleansing: No Slough/Fibrino Yes Wound Bed Granulation Amount: Medium (34-66%) Exposed Structure Granulation Quality: Pink Fascia Exposed: No Garis, Merle C. ([id number]) Necrotic Amount: Medium (34-66%) Fat Layer (Subcutaneous Tissue) Exposed:  Yes Necrotic Quality: Adherent Slough Tendon Exposed: No Muscle Exposed: No Joint Exposed: No Bone Exposed: No Periwound Skin Texture Texture Color No Abnormalities Noted: No No Abnormalities Noted: No Callus: No Atrophie Blanche: No Crepitus: No Cyanosis: No Excoriation: No Ecchymosis: Yes Induration: No Erythema: No Rash: No Hemosiderin Staining: No Scarring: Yes Mottled: No Pallor: No Moisture Rubor: No No Abnormalities Noted: No Dry / Scaly: No Temperature / Pain Maceration: No Temperature: No Abnormality Tenderness on Palpation: Yes Wound Preparation Ulcer Cleansing: Other: soap and water, Topical Anesthetic Applied: None, Other: lidocaine 4%, Treatment Notes Wound #2 (Right, Medial Malleolus) 1. Cleansed with: Clean wound with Normal Saline Cleanse wound with antibacterial soap and water 2. Anesthetic Topical Lidocaine 4% cream to wound bed prior to debridement 4. Dressing Applied: Hydrafera Blue 5. Secondary Dressing Applied ABD Pad 7. Secured with Tape 3 Layer Compression System - Right Lower Extremity Notes xtrasorb, drawtex, unna to anchor Electronic Signature(s) Signed: 03/01/2017 4:17:34 PM By: Alejandro Mulling Entered By: Alejandro Mulling on 03/01/2017 08:11:21

## 2017-03-04 NOTE — Progress Notes (Signed)
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 Description: 11045 - WC PHYS SUBQ TISS EA ADDL 20 CM Description Tillman Abide  ([id number]) Modifier: Quantity: 1 Electronic Signature(s) Signed: 02/26/2017 5:35:48 PM By: [name] MD Entered By: [name] on 02/26/2017 08:32:37

## 2017-03-05 ENCOUNTER — Encounter: Payer: Medicare HMO | Admitting: Physician Assistant

## 2017-03-05 DIAGNOSIS — I87331 Chronic venous hypertension (idiopathic) with ulcer and inflammation of right lower extremity: Secondary | ICD-10-CM | POA: Diagnosis not present

## 2017-03-06 NOTE — Progress Notes (Signed)
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 Blanche, Cyanosis, Hemosiderin Staining, Mottled, Pallor, Rubor, Erythema. Periwound temperature was noted as No Abnormality. The periwound has tenderness [name] palpation. 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 There was a Skin/Subcutaneous Tissue Debridement ([id number]) debridement with total area of 35.4 sq cm performed by STONE III, HOYT E., PA-C. with the following instrument(s): Curette to remove Viable and Non-Viable tissue/material including Exudate, Fibrin/Slough, and Subcutaneous after achieving pain control using Lidocaine 4% Topical Solution. A time out was conducted at 08:22, prior to the start of the  procedure. A Minimum amount of bleeding was controlled with Pressure. The procedure was tolerated well with a pain level of 0 throughout and a pain level of 0 following the procedure. Post Debridement Measurements: 5.9cm length x 6cm width x 0.3cm depth; 8.341cm^3 volume. Character of Wound/Ulcer Post Debridement requires further debridement. Severity of Tissue Post Debridement is: Fat layer exposed. Post procedure Diagnosis Wound #1: Same as Pre-Procedure Wound #2 Pre-procedure diagnosis of Wound #2 is a Venous Leg Ulcer located [name] the Right,Medial Malleolus .Severity of Tissue Pre Debridement is: Fat layer exposed. There was a Skin/Subcutaneous Tissue Debridement ([id number]) debridement with total area of 3.15 sq cm performed by STONE III, HOYT E., PA-C. with the following instrument(s): Curette to remove Viable and Non-Viable tissue/material including Exudate, Fibrin/Slough, and Subcutaneous after achieving pain control using Lidocaine 4% Topical Solution. A time out was conducted at [employer],  prior to the start of the procedure. A Minimum amount [name], [name] ([id number]) of bleeding was controlled with Pressure. The procedure was tolerated well with a pain level of 0 throughout and a pain level of 0 following the procedure. Post Debridement Measurements: 2.1cm length x 1.5cm width x 0.2cm depth; 0.495cm^3 volume. Character of Wound/Ulcer Post Debridement requires further debridement. Severity of Tissue Post Debridement is: Fat layer exposed. Post procedure Diagnosis Wound #2: Same as Pre-Procedure Plan Wound Cleansing: Wound #1 Right,Lateral Malleolus: Cleanse wound with mild soap and water May shower with protection. No tub bath. Wound #2 Right,Medial Malleolus: Cleanse wound with mild soap and water May shower with protection. No tub bath. Anesthetic: Wound #1 Right,Lateral Malleolus: Topical Lidocaine 4% cream applied to wound bed prior to debridement Wound #2  Right,Medial Malleolus: Topical Lidocaine 4% cream applied to wound bed prior to debridement Skin Barriers/Peri-Wound Care: Wound #1 Right,Lateral Malleolus: Barrier cream - zinc oxide Triamcinolone Acetonide Ointment Primary Wound Dressing: Wound #1 Right,Lateral Malleolus: Hydrafera Blue Wound #2 Right,Medial Malleolus: Hydrafera Blue Secondary Dressing: Wound #1 Right,Lateral Malleolus: ABD pad XtraSorb Drawtex Wound #2 Right,Medial Malleolus: ABD pad Drawtex Drawtex Dressing Change Frequency: Wound #1 Right,Lateral Malleolus: Change dressing every week Other: - nurse visit Friday [name], [name] ([id number]) Wound #2 Right,Medial Malleolus: Change dressing every week Other: - nurse visit Friday Follow-up Appointments: Wound #1 Right,Lateral Malleolus: Return Appointment in 1 week. Other: - nurse visit Friday Wound #2 Right,Medial Malleolus: Return Appointment in 1 week. Other: - nurse visit Friday Edema Control: Wound #1 Right,Lateral Malleolus: 3 Layer Compression System - Right Lower Extremity - UNNA TO ANCHOR Elevate legs to the level of the heart and pump ankles as often as possible Wound #2 Right,Medial Malleolus: 3 Layer Compression System - Right Lower Extremity - UNNA TO ANCHOR Elevate legs to the level of the heart and pump ankles as often as possible Additional Orders / Instructions: Wound #1 Right,Lateral Malleolus: Increase protein intake. OK to return to work with the following restrictions: Activity as tolerated Other: - Run Apligraph through insurance Wound #2 Right,Medial Malleolus: Increase protein intake. OK to return to work with the following restrictions: Activity as tolerated General Notes: I am going to recommend that we continue with the [facility] Dressing for for the next week as this seems to be doing very well for him. If anything worsens in the interim he will contact our office for additional recommendations. Electronic  Signature(s) Signed: 03/05/2017 7:35:55 PM By: Lenda Kelp PA-C Entered By: Lenda Kelp [name] 03/05/2017 08:32:00 [name] ([id number]) -------------------------------------------------------------------------------- SuperBill Details Patient Name: [name] Date of Service: 03/05/2017 Medical Record Number: [id number] Patient Account Number: [id number] Date of Birth/Sex: November 17, 1945 (72 y.o. 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, initial T85.613A encounter Disruption of external operation (surgical) wound, not elsewhere classified, subsequent T81.31XD encounter L97.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 of skin Facility Procedures CPT4 Code: [id number] Description: 11042 - DEB SUBQ TISSUE 20 SQ CM/< ICD-10 Description Diagnosis L97.212 Non-pressure chronic ulcer of right calf with fat la Modifier: yer exposed Quantity:  1 CPT4 Code: [id number] Description: 11045 - DEB SUBQ TISS EA ADDL 20CM ICD-10 Description Diagnosis L97.212 Non-pressure chronic ulcer of right calf with fat la Modifier: yer exposed Quantity: 1 Physician Procedures CPT4 Code: [id number] Description: 11042 - WC PHYS SUBQ TISS 20 SQ CM ICD-10 Description Diagnosis L97.212 Non-pressure chronic ulcer of right calf with fat la Modifier: yer exposed Quantity: 1 CPT4 Code: [id number] Maffia, L Description: 11045 - WC PHYS SUBQ TISS EA ADDL 20 CM ICD-10  Description Diagnosis ONNIE C. ([id number]) Modifier: Quantity: 1 Electronic Signature(s) Signed: 03/05/2017 7:35:55 PM By: Lenda KelpStone III, Hoyt PA-C Entered By: Lenda KelpStone III, Hoyt [name] 03/05/2017 08:32:39

## 2017-03-06 NOTE — Progress Notes (Signed)
ALANSON, HAUSMANN ([id number]) Visit Report for 03/05/2017 Arrival Information Details Patient Name: [name], [name]. Date of Service: 03/05/2017 8:00 AM Medical Record Number: [id number] Patient Account Number: [id number] Date of Birth/Sex: Aug 20, 1946 (71 y.o. Male) Treating RN: Ashok Cordia, [name] Primary Care Wadie Mattie: [name] Other Clinician: Referring Joell Buerger: [name] Treating Earlyn Sylvan/Extender: STONE III, HOYT Weeks in Treatment: 59 Visit Information History Since Last Visit All ordered tests and consults were completed: No Patient Arrived: Ambulatory Added or deleted any medications: No Arrival Time: 08:00 Any new allergies or adverse reactions: No Accompanied By: self Had a fall or experienced change in No Transfer Assistance: None activities of daily living that may affect Patient Identification Verified: Yes risk of falls: Secondary Verification Process Yes Signs or symptoms of abuse/neglect since last No Completed: visito Patient Requires Transmission-Based No Hospitalized since last visit: No Precautions: Has Dressing in Place as Prescribed: Yes Patient Has Alerts: No Has Compression in Place as Prescribed: Yes Pain Present Now: No Electronic Signature(s) Signed: 03/05/2017 4:36:33 PM By: Alejandro Mulling Entered By: Alejandro Mulling on 03/05/2017 08:06:03 [name] ([id number]) -------------------------------------------------------------------------------- Encounter Discharge Information Details Patient Name: [name] Date of Service: 03/05/2017 8:00 AM Medical Record Number: [id number] Patient Account Number: [id number] Date of Birth/Sex: 1946-03-30 (71 y.o. Male) Treating RN: [name] Primary Care Trayon Krantz: [name] Other Clinician: Referring Jairen Goldfarb: [name] Treating Maor Meckel/Extender: STONE III, HOYT Weeks in Treatment: 98 Encounter Discharge Information Items Discharge Pain Level: 0 Discharge  Condition: Stable Ambulatory Status: Ambulatory Discharge Destination: Home Transportation: Private Auto Accompanied By: self Schedule Follow-up Appointment: Yes Medication Reconciliation completed No and provided to Patient/Care Stace Peace: Patient Clinical Summary of Care: Declined Electronic Signature(s) Signed: 03/05/2017 8:41:46 AM By: Gwenlyn Perking Entered By: Gwenlyn Perking on 03/05/2017 08:41:45 [name] ([id number]) -------------------------------------------------------------------------------- Lower Extremity Assessment Details Patient Name: [name] Date of Service: 03/05/2017 8:00 AM Medical Record Number: [id number] Patient Account Number: [id number] Date of Birth/Sex: 1945/10/01 (71 y.o. Male) Treating RN: [name] Primary Care Kiyomi Pallo: [name] Other Clinician: Referring Marisa Hufstetler: [name] Treating Misbah Hornaday/Extender: STONE III, HOYT Weeks in Treatment: 59 Vascular Assessment Pulses: Dorsalis Pedis Palpable: [Right:Yes] Posterior Tibial Extremity colors, hair growth, and conditions: Extremity Color: [Right:Normal] Temperature of Extremity: [Right:Warm] Capillary Refill: [Right:< 3 seconds] Toe Nail Assessment Left: Right: Thick: Yes Discolored: Yes Deformed: Yes Improper Length and Hygiene: No Electronic Signature(s) Signed: 03/05/2017 4:36:33 PM By: Alejandro Mulling Entered By: Alejandro Mulling on 03/05/2017 08:19:08 [name] ([id number]) -------------------------------------------------------------------------------- Multi Wound Chart Details Patient Name: [name] Date of Service: 03/05/2017 8:00 AM Medical Record Number: [id number] Patient Account Number: [id number] Date of Birth/Sex: 1945-12-09 (72 y.o. Male) Treating RN: Ashok Cordia, [name] Primary Care Amirra Herling: [name] Other Clinician: Referring Juliah Scadden: [name] Treating Kamorah Nevils/Extender: STONE III, HOYT Weeks in Treatment:  59 Vital Signs Height(in): 76 Pulse(bpm): 68 Weight(lbs): 238 Blood Pressure 136/63 (mmHg): Body Mass Index(BMI): 29 Temperature(F): 97.9 Respiratory Rate 18 (breaths/min): Photos: [1:No Photos] [2:No Photos] [N/A:N/A] Wound Location: [1:Right Malleolus - Lateral] [2:Right Malleolus - Medial] [N/A:N/A] Wounding Event: [1:Gradually Appeared] [2:Gradually Appeared] [N/A:N/A] Primary Etiology: [1:Venous Leg Ulcer] [2:Venous Leg Ulcer] [N/A:N/A] Comorbid History: [1:Cataracts, Chronic Obstructive Pulmonary Disease (COPD), Osteoarthritis] [2:Cataracts, Chronic Obstructive Pulmonary Disease (COPD), Osteoarthritis] [N/A:N/A] Date Acquired: [1:08/11/2015] [2:01/30/2016] [N/A:N/A] Weeks of Treatment: [1:59] [2:57] [N/A:N/A] Wound Status: [1:Open] [2:Open] [N/A:N/A] Clustered Wound: [1:No] [2:Yes] [N/A:N/A] Measurements L x W x D 5.9x6x0.2 [2:2.1x1.5x0.2] [N/A:N/A] (cm) Area (cm) : [1:27.803] [2:2.474] [N/A:N/A] Volume (cm) : [1:5.561] [2:0.495] [N/A:N/A] % Reduction in Area: [1:-60.90%] [  2:78.60%] [N/A:N/A] % Reduction in Volume: -7.30% [2:57.10%] [N/A:N/A] Classification: [1:Full Thickness Without Exposed Support Structures] [2:Full Thickness Without Exposed Support Structures] [N/A:N/A] Exudate Amount: [1:Large] [2:Large] [N/A:N/A] Exudate Type: [1:Purulent] [2:Serosanguineous] [N/A:N/A] Exudate Color: [1:yellow, brown, green] [2:red, brown] [N/A:N/A] Wound Margin: [1:Indistinct, nonvisible] [2:Indistinct, nonvisible] [N/A:N/A] Granulation Amount: [1:Medium (34-66%)] [2:Medium (34-66%)] [N/A:N/A] Granulation Quality: [1:Red, Pink] [2:Pink] [N/A:N/A] Necrotic Amount: [1:Medium (34-66%)] [2:Medium (34-66%)] [N/A:N/A] Exposed Structures: [1:Fat Layer (Subcutaneous Tissue) Exposed: Yes] [2:Fat Layer (Subcutaneous Tissue) Exposed: Yes] [N/A:N/A] Fascia: No Fascia: No Tendon: No Tendon: No Muscle: No Muscle: No Joint: No Joint: No Bone: No Bone: No Epithelialization: Small  (1-33%) Small (1-33%) N/A Periwound Skin Texture: Scarring: Yes Scarring: Yes N/A Excoriation: No Excoriation: No Induration: No Induration: No Callus: No Callus: No Crepitus: No Crepitus: No Rash: No Rash: No Periwound Skin Maceration: Yes Maceration: No N/A Moisture: Dry/Scaly: No Dry/Scaly: No Periwound Skin Color: Ecchymosis: Yes Ecchymosis: Yes N/A Atrophie Blanche: No Atrophie Blanche: No Cyanosis: No Cyanosis: No Erythema: No Erythema: No Hemosiderin Staining: No Hemosiderin Staining: No Mottled: No Mottled: No Pallor: No Pallor: No Rubor: No Rubor: No Temperature: No Abnormality No Abnormality N/A Tenderness on Yes Yes N/A Palpation: Wound Preparation: Ulcer Cleansing: Other: Ulcer Cleansing: Other: N/A soap and water soap and water Topical Anesthetic Topical Anesthetic Applied: None, Other: Applied: None, Other: lidocaine 4% lidocaine 4% Treatment Notes Electronic Signature(s) Signed: 03/05/2017 4:36:33 PM By: Alejandro Mulling Entered By: Alejandro Mulling on 03/05/2017 08:19:30 [name] ([id number]) -------------------------------------------------------------------------------- Multi-Disciplinary Care Plan Details Patient Name: [name] Date of Service: 03/05/2017 8:00 AM Medical Record Number: [id number] Patient Account Number: [id number] Date of Birth/Sex: 1945-09-03 (71 y.o. Male) Treating RN: [name] Primary Care Darrol Brandenburg: [name] Other Clinician: Referring Lama Narayanan: [name] Treating Chrisanne Loose/Extender: STONE III, HOYT Weeks in Treatment: 16 Active Inactive ` Venous Leg Ulcer Nursing Diagnoses: Knowledge deficit related to disease process and management Goals: Patient will maintain optimal edema control Date Initiated: 04/03/2016 Target Resolution Date: 11/24/2016 Goal Status: Active Interventions: Compression as ordered Treatment Activities: Therapeutic compression applied :  04/03/2016 Notes: ` Wound/Skin Impairment Nursing Diagnoses: Impaired tissue integrity Goals: Ulcer/skin breakdown will heal within 14 weeks Date Initiated: 01/17/2016 Target Resolution Date: 11/24/2016 Goal Status: Active Interventions: Assess ulceration(s) every visit Notes: Electronic Signature(s) Signed: 03/05/2017 4:36:33 PM By: Aurelio Jew, [name] ([id number]) Entered By: Alejandro Mulling on 03/05/2017 08:19:18 [name] ([id number]) -------------------------------------------------------------------------------- Pain Assessment Details Patient Name: [name] Date of Service: 03/05/2017 8:00 AM Medical Record Number: [id number] Patient Account Number: [id number] Date of Birth/Sex: 1946-02-02 (71 y.o. Male) Treating RN: [name] Primary Care Myha Arizpe: [name] Other Clinician: Referring Alanii Ramer: [name] Treating Witney Huie/Extender: STONE III, HOYT Weeks in Treatment: 59 Active Problems Location of Pain Severity and Description of Pain Patient Has Paino No Site Locations With Dressing Change: No Pain Management and Medication Current Pain Management: Electronic Signature(s) Signed: 03/05/2017 4:36:33 PM By: Alejandro Mulling Entered By: Alejandro Mulling on 03/05/2017 08:06:11 [name] ([id number]) -------------------------------------------------------------------------------- Patient/Caregiver Education Details Patient Name: [name] Date of Service: 03/05/2017 8:00 AM Medical Record Number: [id number] Patient Account Number: [id number] Date of Birth/Gender: 01/04/1946 (71 y.o. Male) Treating RN: [name] Primary Care Physician: [name] Other Clinician: Referring Physician: [name] Treating Physician/Extender: Lenda Kelp Weeks in Treatment: 52 Education Assessment Education Provided To: Patient Education Topics Provided Wound/Skin Impairment: Handouts: Other: change  dressing as ordered Methods: Demonstration, Explain/Verbal Responses: State content correctly Electronic Signature(s) Signed: 03/05/2017 4:36:33 PM By: Alejandro Mulling Entered  By: Alejandro MullingPinkerton, Debra on 03/05/2017 08:20:07 [name] StackBLACKWELL, [name] ([id number]) -------------------------------------------------------------------------------- Wound Assessment Details Patient Name: [name] StackBLACKWELL, [name] Date of Service: 03/05/2017 8:00 AM Medical Record Number: [id number] Patient Account Number: [id number] Date of Birth/Sex: April 17, 1946 [age] y.o. Male) Treating RN: Ashok CordiaPinkerton, [name] Primary Care Jaydn Moscato: Darreld McleanMILES, LINDA Other Clinician: Referring Kalib Bhagat: Darreld McleanMILES, LINDA Treating Elster Corbello/Extender: STONE III, HOYT Weeks in Treatment: 59 Wound Status Wound Number: 1 Primary Venous Leg Ulcer Etiology: Wound Location: Right Malleolus - Lateral Wound Open Wounding Event: Gradually Appeared Status: Date Acquired: 08/11/2015 Comorbid Cataracts, Chronic Obstructive Weeks Of Treatment: 59 History: Pulmonary Disease (COPD), Clustered Wound: No Osteoarthritis Photos Photo Uploaded By: Alejandro MullingPinkerton, Debra on 03/05/2017 09:18:59 Wound Measurements Length: (cm) 5.9 Width: (cm) 6 Depth: (cm) 0.2 Area: (cm) 27.803 Volume: (cm) 5.561 % Reduction in Area: -60.9% % Reduction in Volume: -7.3% Epithelialization: Small (1-33%) Tunneling: No Undermining: No Wound Description Full Thickness Without Exposed Foul Odor After Classification: Support Structures Slough/Fibrino Wound Margin: Indistinct, nonvisible Exudate Large Amount: Exudate Type: Purulent Exudate Color: yellow, brown, green Cleansing: No Yes Wound Bed Granulation Amount: Medium (34-66%) Exposed Structure Granulation Quality: Red, Pink Fascia Exposed: No [name], [name] ([id number]) Necrotic Amount: Medium (34-66%) Fat Layer (Subcutaneous Tissue) Exposed: Yes Necrotic Quality: Adherent Slough Tendon Exposed: No Muscle Exposed:  No Joint Exposed: No Bone Exposed: No Periwound Skin Texture Texture Color No Abnormalities Noted: No No Abnormalities Noted: No Callus: No Atrophie Blanche: No Crepitus: No Cyanosis: No Excoriation: No Ecchymosis: Yes Induration: No Erythema: No Rash: No Hemosiderin Staining: No Scarring: Yes Mottled: No Pallor: No Moisture Rubor: No No Abnormalities Noted: No Dry / Scaly: No Temperature / Pain Maceration: Yes Temperature: No Abnormality Tenderness on Palpation: Yes Wound Preparation Ulcer Cleansing: Other: soap and water, Topical Anesthetic Applied: None, Other: lidocaine 4%, Treatment Notes Wound #1 (Right, Lateral Malleolus) 1. Cleansed with: Clean wound with Normal Saline Cleanse wound with antibacterial soap and water 2. Anesthetic Topical Lidocaine 4% cream to wound bed prior to debridement 3. Peri-wound Care: Barrier cream Moisturizing lotion 4. Dressing Applied: Hydrafera Blue 5. Secondary Dressing Applied ABD Pad 7. Secured with Tape 3 Layer Compression System - Right Lower Extremity Notes drawtex, xtrasorb, unna to Ecologistanchor Electronic Signature(s) Signed: 03/05/2017 4:36:33 PM By: Aurelio JewPinkerton, Debra Greek, [name] ([id number]) Entered By: Alejandro MullingPinkerton, Debra on 03/05/2017 08:16:37 [name] StackBLACKWELL, Thermon C. ([id number]) -------------------------------------------------------------------------------- Wound Assessment Details Patient Name: [name] StackBLACKWELL, Lehi C. Date of Service: 03/05/2017 8:00 AM Medical Record Number: [id number] Patient Account Number: [id number] Date of Birth/Sex: April 17, 1946 [age] y.o. Male) Treating RN: Ashok CordiaPinkerton, [name] Primary Care Dynasti Kerman: Darreld McleanMILES, LINDA Other Clinician: Referring Hue Frick: Darreld McleanMILES, LINDA Treating Betheny Suchecki/Extender: STONE III, HOYT Weeks in Treatment: 59 Wound Status Wound Number: 2 Primary Venous Leg Ulcer Etiology: Wound Location: Right Malleolus - Medial Wound Open Wounding Event: Gradually Appeared Status: Date  Acquired: 01/30/2016 Comorbid Cataracts, Chronic Obstructive Weeks Of Treatment: 57 History: Pulmonary Disease (COPD), Clustered Wound: Yes Osteoarthritis Photos Photo Uploaded By: Alejandro MullingPinkerton, Debra on 03/05/2017 09:19:18 Wound Measurements Length: (cm) 2.1 Width: (cm) 1.5 Depth: (cm) 0.2 Area: (cm) 2.474 Volume: (cm) 0.495 % Reduction in Area: 78.6% % Reduction in Volume: 57.1% Epithelialization: Small (1-33%) Tunneling: No Undermining: No Wound Description Full Thickness Without Exposed Foul Odor Afte Classification: Support Structures Slough/Fibrino Wound Margin: Indistinct, nonvisible Exudate Large Amount: Exudate Type: Serosanguineous Exudate Color: red, brown r Cleansing: No Yes Wound Bed Granulation Amount: Medium (34-66%) Exposed Structure Granulation Quality: Pink Fascia Exposed: No [name], [name] ([id number]) Necrotic Amount: Medium (34-66%) Fat Layer (Subcutaneous Tissue) Exposed: Yes Necrotic Quality:  Adherent Slough Tendon Exposed: No Muscle Exposed: No Joint Exposed: No Bone Exposed: No Periwound Skin Texture Texture Color No Abnormalities Noted: No No Abnormalities Noted: No Callus: No Atrophie Blanche: No Crepitus: No Cyanosis: No Excoriation: No Ecchymosis: Yes Induration: No Erythema: No Rash: No Hemosiderin Staining: No Scarring: Yes Mottled: No Pallor: No Moisture Rubor: No No Abnormalities Noted: No Dry / Scaly: No Temperature / Pain Maceration: No Temperature: No Abnormality Tenderness on Palpation: Yes Wound Preparation Ulcer Cleansing: Other: soap and water, Topical Anesthetic Applied: None, Other: lidocaine 4%, Treatment Notes Wound #2 (Right, Medial Malleolus) 1. Cleansed with: Clean wound with Normal Saline Cleanse wound with antibacterial soap and water 2. Anesthetic Topical Lidocaine 4% cream to wound bed prior to debridement 3. Peri-wound Care: Barrier cream Moisturizing lotion 4. Dressing  Applied: Hydrafera Blue 5. Secondary Dressing Applied ABD Pad 7. Secured with Tape 3 Layer Compression System - Right Lower Extremity Notes drawtex, xtrasorb, unna to Ecologist) Signed: 03/05/2017 4:36:33 PM By: Aurelio Jew, [name] ([id number]) Entered By: Alejandro Mulling on 03/05/2017 08:17:03 [name] ([id number]) -------------------------------------------------------------------------------- Vitals Details Patient Name: [name] Date of Service: 03/05/2017 8:00 AM Medical Record Number: [id number] Patient Account Number: [id number] Date of Birth/Sex: [birth year] (71 y.o. Male) Treating RN: Ashok Cordia, [name] Primary Care Dola Lunsford: [name] Other Clinician: Referring Farzana Koci: [name] Treating Rosan Calbert/Extender: STONE III, HOYT Weeks in Treatment: 59 Vital Signs Time Taken: 08:05 Temperature (F): 97.9 Height (in): 76 Pulse (bpm): 68 Weight (lbs): 238 Respiratory Rate (breaths/min): 18 Body Mass Index (BMI): 29 Blood Pressure (mmHg): 136/63 Reference Range: 80 - 120 mg / dl Electronic Signature(s) Signed: 03/05/2017 4:36:33 PM By: Alejandro Mulling Entered By: Alejandro Mulling on 03/05/2017 08:07:53

## 2017-03-08 DIAGNOSIS — I87331 Chronic venous hypertension (idiopathic) with ulcer and inflammation of right lower extremity: Secondary | ICD-10-CM | POA: Diagnosis not present

## 2017-03-11 NOTE — Progress Notes (Signed)
[name], [name] ([id number]) Visit Report for 03/08/2017 Arrival Information Details Patient Name: [name], [name] Date of Service: 03/08/2017 8:00 AM Medical Record Number: [id number] Patient Account Number: [id number] Date of Birth/Sex: 06-21-1946 [age] y.o. Male) Treating RN: Phillis HaggisPinkerton, [name] Primary Care Kadajah Kjos: Darreld McleanMILES, LINDA Other Clinician: Referring Evalisse Prajapati: Darreld McleanMILES, LINDA Treating Jamea Robicheaux/Extender: Linwood DibblesSTONE III, HOYT Weeks in Treatment: 59 Visit Information History Since Last Visit All ordered tests and consults were No Patient Arrived: Ambulatory completed: Arrival Time: 08:03 Added or deleted any medications: No Accompanied By: self Any new allergies or adverse reactions: No Transfer Assistance: None Had a fall or experienced change in No Patient Identification Verified: Yes activities of daily living that may affect Secondary Verification Process Yes risk of falls: Completed: Signs or symptoms of abuse/neglect No Patient Requires Transmission-Based No since last visito Precautions: Hospitalized since last visit: No Patient Has Alerts: No Has Dressing in Place as Prescribed: Yes Has Compression in Place as Yes Prescribed: Has Footwear/Offloading in Place as Yes Prescribed: Right: Surgical Shoe with Pressure Relief Insole Pain Present Now: No Electronic Signature(s) Signed: 03/08/2017 4:14:11 PM By: Alejandro MullingPinkerton, Debra Entered By: Alejandro MullingPinkerton, Debra on 03/08/2017 08:05:34 [name], [name] ([id number]) -------------------------------------------------------------------------------- Encounter Discharge Information Details Patient Name: [name], [name] Date of Service: 03/08/2017 8:00 AM Medical Record Number: [id number] Patient Account Number: [id number] Date of Birth/Sex: 06-21-1946 [age] y.o. Male) Treating RN: Phillis HaggisPinkerton, [name] Primary Care Zlata Alcaide: Darreld McleanMILES, LINDA Other Clinician: Referring Dorothy Polhemus: Darreld McleanMILES, LINDA Treating Joselito Fieldhouse/Extender: STONE III,  HOYT Weeks in Treatment: 3059 Encounter Discharge Information Items Discharge Pain Level: 0 Discharge Condition: Stable Ambulatory Status: Ambulatory Discharge Destination: Home Private Transportation: Auto Accompanied By: self Schedule Follow-up Appointment: Yes Medication Reconciliation completed and No provided to Patient/Care Kairee Kozma: Clinical Summary of Care: Electronic Signature(s) Signed: 03/08/2017 4:14:11 PM By: Alejandro MullingPinkerton, Debra Entered By: Alejandro MullingPinkerton, Debra on 03/08/2017 08:27:03 [name], [name] ([id number]) -------------------------------------------------------------------------------- Patient/Caregiver Education Details Patient Name: [name], [name] Date of Service: 03/08/2017 8:00 AM Medical Record Number: [id number] Patient Account Number: [id number] Date of Birth/Gender: 06-21-1946 [age] y.o. Male) Treating RN: Phillis HaggisPinkerton, [name] Primary Care Physician: Darreld McleanMILES, LINDA Other Clinician: Referring Physician: Darreld McleanMILES, LINDA Treating Physician/Extender: Lenda KelpSTONE III, HOYT Weeks in Treatment: 7359 Education Assessment Education Provided To: Patient Education Topics Provided Wound/Skin Impairment: Handouts: Other: do not get dressing wet Methods: Demonstration, Explain/Verbal Responses: State content correctly Electronic Signature(s) Signed: 03/08/2017 4:14:11 PM By: Alejandro MullingPinkerton, Debra Entered By: Alejandro MullingPinkerton, Debra on 03/08/2017 08:26:47 [name], [name] ([id number]) -------------------------------------------------------------------------------- Wound Assessment Details Patient Name: [name], [name] Date of Service: 03/08/2017 8:00 AM Medical Record Number: [id number] Patient Account Number: [id number] Date of Birth/Sex: 06-21-1946 [age] y.o. Male) Treating RN: Ashok CordiaPinkerton, [name] Primary Care Kleo Dungee: Darreld McleanMILES, LINDA Other Clinician: Referring Jerriann Schrom: Darreld McleanMILES, LINDA Treating Yoshimi Sarr/Extender: STONE III, HOYT Weeks in Treatment: 59 Wound Status Wound Number: 1  Primary Venous Leg Ulcer Etiology: Wound Location: Right Malleolus - Lateral Wound Open Wounding Event: Gradually Appeared Status: Date Acquired: 08/11/2015 Comorbid Cataracts, Chronic Obstructive Weeks Of Treatment: 59 History: Pulmonary Disease (COPD), Clustered Wound: No Osteoarthritis Photos Photo Uploaded By: Alejandro MullingPinkerton, Debra on 03/08/2017 08:31:45 Wound Measurements Length: (cm) 5.9 Width: (cm) 6 Depth: (cm) 0.2 Area: (cm) 27.803 Volume: (cm) 5.561 % Reduction in Area: -60.9% % Reduction in Volume: -7.3% Epithelialization: Small (1-33%) Tunneling: No Undermining: No Wound Description Full Thickness Without Exposed Foul Odor After Classification: Support Structures Slough/Fibrino Wound Margin: Indistinct, nonvisible Exudate Large Amount: Exudate Type: Purulent Exudate Color: yellow, brown, green Cleansing: No Yes Wound Bed Granulation Amount: Medium (34-66%) Exposed Structure Granulation Quality: Red, Pink  Fascia Exposed: No Rathel, [name] Necrotic Amount: Medium (34-66%) Fat Layer (Subcutaneous Tissue) Exposed: Yes Necrotic Quality: Adherent Slough Tendon Exposed: No Muscle Exposed: No Joint Exposed: No Bone Exposed: No Periwound Skin Texture Texture Color No Abnormalities Noted: No No Abnormalities Noted: No Callus: No Atrophie Blanche: No Crepitus: No Cyanosis: No Excoriation: No Ecchymosis: Yes Induration: No Erythema: No Rash: No Hemosiderin Staining: No Scarring: Yes Mottled: No Pallor: No Moisture Rubor: No No Abnormalities Noted: No Dry / Scaly: No Temperature / Pain Maceration: Yes Temperature: No Abnormality Tenderness on Palpation: Yes Wound Preparation Ulcer Cleansing: Other: soap and water, Topical Anesthetic Applied: None, Other: lidocaine 4%, Treatment Notes Wound #1 (Right, Lateral Malleolus) 1. Cleansed with: Clean wound with Normal Saline Cleanse wound with antibacterial soap and water 2.  Anesthetic Topical Lidocaine 4% cream to wound bed prior to debridement 3. Peri-wound Care: Barrier cream Moisturizing lotion 4. Dressing Applied: Hydrafera Blue 5. Secondary Dressing Applied ABD Pad 7. Secured with Tape 3 Layer Compression System - Right Lower Extremity Notes drawtex, xtrasorb, unna to Ecologist) Signed: 03/08/2017 4:14:11 PM By: Aurelio Jew, [name] ([id number]) Entered By: Alejandro Mulling on 03/08/2017 08:25:06 [name] Stack ([id number]) -------------------------------------------------------------------------------- Wound Assessment Details Patient Name: [name] Stack Date of Service: 03/08/2017 8:00 AM Medical Record Number: [id number] Patient Account Number: [id number] Date of Birth/Sex: [birth year] (71 y.o. Male) Treating RN: Ashok Cordia, [name] Primary Care Tawsha Terrero: [name] Other Clinician: Referring Brynna Dobos: [name] Treating Jaquay Posthumus/Extender: STONE III, HOYT Weeks in Treatment: 59 Wound Status Wound Number: 2 Primary Venous Leg Ulcer Etiology: Wound Location: Right Malleolus - Medial Wound Open Wounding Event: Gradually Appeared Status: Date Acquired: 01/30/2016 Comorbid Cataracts, Chronic Obstructive Weeks Of Treatment: 57 History: Pulmonary Disease (COPD), Clustered Wound: Yes Osteoarthritis Photos Photo Uploaded By: Alejandro Mulling on 03/08/2017 08:31:46 Wound Measurements Length: (cm) 2.1 Width: (cm) 1.5 Depth: (cm) 0.2 Area: (cm) 2.474 Volume: (cm) 0.495 % Reduction in Area: 78.6% % Reduction in Volume: 57.1% Epithelialization: Small (1-33%) Tunneling: No Undermining: No Wound Description Full Thickness Without Exposed Classification: Support Structures Wound Margin: Indistinct, nonvisible Exudate Large Amount: Exudate Type: Serosanguineous Exudate Color: red, brown Foul Odor After Cleansing: No Slough/Fibrino Yes Wound Bed Granulation Amount: Medium (34-66%)  Exposed Structure Granulation Quality: Pink Fascia Exposed: No Anfinson, Brax C. ([id number]) Necrotic Amount: Medium (34-66%) Fat Layer (Subcutaneous Tissue) Exposed: Yes Necrotic Quality: Adherent Slough Tendon Exposed: No Muscle Exposed: No Joint Exposed: No Bone Exposed: No Periwound Skin Texture Texture Color No Abnormalities Noted: No No Abnormalities Noted: No Callus: No Atrophie Blanche: No Crepitus: No Cyanosis: No Excoriation: No Ecchymosis: Yes Induration: No Erythema: No Rash: No Hemosiderin Staining: No Scarring: Yes Mottled: No Pallor: No Moisture Rubor: No No Abnormalities Noted: No Dry / Scaly: No Temperature / Pain Maceration: No Temperature: No Abnormality Tenderness on Palpation: Yes Wound Preparation Ulcer Cleansing: Other: soap and water, Topical Anesthetic Applied: None, Other: lidocaine 4%, Treatment Notes Wound #2 (Right, Medial Malleolus) 1. Cleansed with: Clean wound with Normal Saline Cleanse wound with antibacterial soap and water 2. Anesthetic Topical Lidocaine 4% cream to wound bed prior to debridement 3. Peri-wound Care: Barrier cream Moisturizing lotion 4. Dressing Applied: Hydrafera Blue 5. Secondary Dressing Applied ABD Pad 7. Secured with Tape 3 Layer Compression System - Right Lower Extremity Notes drawtex, xtrasorb, unna to Ecologist) Signed: 03/08/2017 4:14:11 PM By: Aurelio Jew, [name] ([id number]) Entered By: Alejandro Mulling on 03/08/2017 08:25:35

## 2017-03-12 ENCOUNTER — Encounter: Payer: Medicare HMO | Admitting: Internal Medicine

## 2017-03-12 DIAGNOSIS — I87331 Chronic venous hypertension (idiopathic) with ulcer and inflammation of right lower extremity: Secondary | ICD-10-CM | POA: Diagnosis not present

## 2017-03-14 NOTE — Progress Notes (Signed)
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 was controlled with Pressure. The procedure was tolerated well with a pain level of 0 throughout and a pain level of 0 following the procedure. Post Debridement Measurements: 6cm length x 5.5cm width  x 0.2cm depth; 5.184cm^3 volume. Character of Wound/Ulcer Post Debridement requires further debridement. Severity of Tissue Post Debridement is: Fat layer exposed. 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 There was a Skin/Subcutaneous Tissue Debridement ([id number]) debridement with total area of 2.85 sq cm performed by [name], MD. with the following instrument(s): scoop to remove Viable and Non-Viable tissue/material including Exudate, Fibrin/Slough, and Subcutaneous after achieving pain control using Lidocaine 4% Topical Solution. A time out was conducted at 08:21, prior to the start of the procedure. A Minimum amount of bleeding  was controlled with Pressure. The procedure was tolerated well with a pain level of 0 throughout and a pain level of 0 following the procedure. Post Debridement Measurements: 1.9cm length x 1.5cm width x 0.2cm depth; 0.448cm^3 volume. [name], [name] ([id number]) Character of Wound/Ulcer Post Debridement requires further debridement. Severity of Tissue Post Debridement is: Fat layer exposed. Post procedure Diagnosis Wound #2: Same as Pre-Procedure Plan Wound Cleansing: Wound #1 Right,Lateral Malleolus: Cleanse wound with mild soap and water May shower with protection. No tub bath. Wound #2 Right,Medial Malleolus: Cleanse wound with mild soap and water May shower with protection. No tub bath. Anesthetic: Wound #1 Right,Lateral Malleolus: Topical Lidocaine 4% cream applied to wound bed prior to debridement Wound #2 Right,Medial Malleolus: Topical Lidocaine 4% cream applied to wound bed prior to debridement Skin Barriers/Peri-Wound Care: Wound #1 Right,Lateral Malleolus: Barrier cream - zinc oxide Triamcinolone Acetonide Ointment Primary Wound Dressing: Wound  #1 Right,Lateral Malleolus: Hydrafera Blue Wound #2 Right,Medial Malleolus: Hydrafera Blue Secondary Dressing: Wound #1 Right,Lateral Malleolus: ABD pad XtraSorb Drawtex Wound #2 Right,Medial Malleolus: ABD pad Drawtex Drawtex Dressing Change Frequency: Wound #1 Right,Lateral Malleolus: Change dressing every week Other: - nurse visit Friday Wound #2 Right,Medial Malleolus: Change dressing every week Other: - nurse visit Friday [name], GUZZO ([id number]) Follow-up Appointments: Wound #1 Right,Lateral Malleolus: Return Appointment in 1 week. Other: - nurse visit Friday Wound #2 Right,Medial Malleolus: Return Appointment in 1 week. Other: - nurse visit Friday Edema Control: Wound #1 Right,Lateral Malleolus: 3 Layer Compression System - Right Lower Extremity - UNNA TO ANCHOR Elevate legs to the level of the heart and pump ankles as often as possible Wound #2 Right,Medial Malleolus: 3 Layer Compression System - Right Lower Extremity - UNNA TO ANCHOR Elevate legs to the level of the heart and pump ankles as often as possible Additional Orders / Instructions: Wound #1 Right,Lateral Malleolus: Increase protein intake. OK to return to work with the following restrictions: Activity as tolerated Other: - Run Apligraph through insurance Wound #2 Right,Medial Malleolus: Increase protein intake. OK to return to work with the following restrictions: Activity as tolerated #1 very gradual progress with Hydrofera Blue #2 as far as I remember he is not a candidate for advanced treatment options although the surface of this wound may preclude any benefit from these. #3 he might benefit from more aggressive debridement/more frequent debridement but I'm not certain how we could arrange this. He keeps his wrap on except for the change on Fridays Electronic Signature(s) Signed: 03/12/2017 5:59:51 PM By: [name] MD Entered By: [name] on 03/12/2017 08:31:51 [name] ([id number]) -------------------------------------------------------------------------------- SuperBill Details [name] Date of Service: 03/12/2017 Patient Name: C. Patient Account Number: [id number] Medical Record Treating RN: [name] [id number] Number: Other Clinician: Date of Birth/Sex: 1946/02/03 (71 y.o. 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, initial T85.613A encounter Disruption of external operation (surgical) wound, not elsewhere classified, subsequent T81.31XD encounter L97.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 of skin Facility Procedures CPT4: Description Modifier Quantity Code 62952841 11042 - DEB SUBQ TISSUE 20 SQ CM/< 1 ICD-10 Description Diagnosis L97.212 Non-pressure chronic ulcer of  right calf with fat layer exposed I87.331 Chronic venous hypertension (idiopathic) with ulcer and  inflammation of right lower extremity CPT4: 16109604 11045 - DEB SUBQ TISS EA ADDL 20CM 1 ICD-10 Description Diagnosis L97.212 Non-pressure chronic ulcer of right calf with fat layer exposed Physician Procedures CPT4: Description Modifier Quantity Code 5409811 11042 - WC PHYS SUBQ TISS 20 SQ CM 1 ICD-10 Description Diagnosis L97.212 Non-pressure chronic ulcer of right calf with fat layer exposed FRANS, VALENTE ([id number]) Electronic Signature(s) Signed: 03/12/2017 5:59:51 PM By: [name] MD Entered By: [name] on 03/12/2017 08:32:52

## 2017-03-15 DIAGNOSIS — I87331 Chronic venous hypertension (idiopathic) with ulcer and inflammation of right lower extremity: Secondary | ICD-10-CM | POA: Diagnosis not present

## 2017-03-15 NOTE — Progress Notes (Signed)
[name], [name] ([id number]) Visit Report for 03/12/2017 Arrival Information Details Patient Name: [name], [name] Date of Service: 03/12/2017 8:00 AM Medical Record Number: [id number] Patient Account Number: [id number] Date of Birth/Sex: 12-18-1945 [age] y.o. Male) Treating RN: Ashok CordiaPinkerton, [name] Primary Care Neshawn Aird: Darreld McleanMILES, LINDA Other Clinician: Referring Nakia Remmers: Darreld McleanMILES, LINDA Treating Akelia Husted/Extender: Altamese CarolinaOBSON, [name] G Weeks in Treatment: 60 Visit Information History Since Last Visit All ordered tests and consults were completed: No Patient Arrived: Ambulatory Added or deleted any medications: No Arrival Time: 08:00 Any new allergies or adverse reactions: No Accompanied By: self Had a fall or experienced change in No Transfer Assistance: None activities of daily living that may affect Patient Identification Verified: Yes risk of falls: Secondary Verification Process Yes Signs or symptoms of abuse/neglect since last No Completed: visito Patient Requires Transmission-Based No Hospitalized since last visit: No Precautions: Has Dressing in Place as Prescribed: Yes Patient Has Alerts: No Has Compression in Place as Prescribed: Yes Pain Present Now: No Electronic Signature(s) Signed: 03/13/2017 5:03:57 PM By: Alejandro MullingPinkerton, Debra Entered By: Alejandro MullingPinkerton, Debra on 03/12/2017 08:06:52 [name], [name] ([id number]) -------------------------------------------------------------------------------- Encounter Discharge Information Details Patient Name: [name], [name] Date of Service: 03/12/2017 8:00 AM Medical Record Number: [id number] Patient Account Number: [id number] Date of Birth/Sex: 12-18-1945 [age] y.o. Male) Treating RN: Phillis HaggisPinkerton, [name] Primary Care Evgenia Merriman: Darreld McleanMILES, LINDA Other Clinician: Referring Ravin Bendall: Darreld McleanMILES, LINDA Treating Annaston Upham/Extender: Maxwell CaulOBSON, [name] G Weeks in Treatment: 60 Encounter Discharge Information Items Discharge Pain Level: 0 Discharge  Condition: Stable Ambulatory Status: Ambulatory Discharge Destination: Home Transportation: Private Auto Accompanied By: self Schedule Follow-up Appointment: Yes Medication Reconciliation completed No and provided to Patient/Care Santiaga Butzin: Patient Clinical Summary of Care: Declined Electronic Signature(s) Signed: 03/12/2017 8:41:24 AM By: Gwenlyn PerkingMoore, Shelia Entered By: Gwenlyn PerkingMoore, Shelia on 03/12/2017 08:41:24 [name], [name] ([id number]) -------------------------------------------------------------------------------- Lower Extremity Assessment Details Patient Name: [name], [name] Date of Service: 03/12/2017 8:00 AM Medical Record Number: [id number] Patient Account Number: [id number] Date of Birth/Sex: 12-18-1945 [age] y.o. Male) Treating RN: Phillis HaggisPinkerton, [name] Primary Care Wendell Nicoson: Darreld McleanMILES, LINDA Other Clinician: Referring Jatinder Mcdonagh: Darreld McleanMILES, LINDA Treating Joreen Swearingin/Extender: Maxwell CaulOBSON, [name] G Weeks in Treatment: 60 Vascular Assessment Pulses: Dorsalis Pedis Palpable: [Right:Yes] Posterior Tibial Extremity colors, hair growth, and conditions: Extremity Color: [Right:Normal] Temperature of Extremity: [Right:Warm] Capillary Refill: [Right:< 3 seconds] Toe Nail Assessment Left: Right: Thick: Yes Discolored: Yes Deformed: No Improper Length and Hygiene: Yes Electronic Signature(s) Signed: 03/13/2017 5:03:57 PM By: Alejandro MullingPinkerton, Debra Entered By: Alejandro MullingPinkerton, Debra on 03/12/2017 08:20:17 [name], [name] ([id number]) -------------------------------------------------------------------------------- Multi Wound Chart Details Patient Name: [name], [name] Date of Service: 03/12/2017 8:00 AM Medical Record Number: [id number] Patient Account Number: [id number] Date of Birth/Sex: 12-18-1945 [age] y.o. Male) Treating RN: Ashok CordiaPinkerton, [name] Primary Care Alisen Marsiglia: Darreld McleanMILES, LINDA Other Clinician: Referring Derrel Moore: Darreld McleanMILES, LINDA Treating Masyn Rostro/Extender: Maxwell CaulOBSON, [name] G Weeks in Treatment:  60 Vital Signs Height(in): 76 Pulse(bpm): 77 Weight(lbs): 238 Blood Pressure 130/60 (mmHg): Body Mass Index(BMI): 29 Temperature(F): 98.0 Respiratory Rate 18 (breaths/min): Photos: [1:No Photos] [2:No Photos] [N/A:N/A] Wound Location: [1:Right Malleolus - Lateral] [2:Right Malleolus - Medial] [N/A:N/A] Wounding Event: [1:Gradually Appeared] [2:Gradually Appeared] [N/A:N/A] Primary Etiology: [1:Venous Leg Ulcer] [2:Venous Leg Ulcer] [N/A:N/A] Comorbid History: [1:Cataracts, Chronic Obstructive Pulmonary Disease (COPD), Osteoarthritis] [2:Cataracts, Chronic Obstructive Pulmonary Disease (COPD), Osteoarthritis] [N/A:N/A] Date Acquired: [1:08/11/2015] [2:01/30/2016] [N/A:N/A] Weeks of Treatment: [1:60] [2:58] [N/A:N/A] Wound Status: [1:Open] [2:Open] [N/A:N/A] Clustered Wound: [1:No] [2:Yes] [N/A:N/A] Measurements L x W x D 6x5.5x0.2 [2:1.9x1.5x0.2] [N/A:N/A] (cm) Area (cm) : [1:25.918] [2:2.238] [N/A:N/A] Volume (cm) : [1:5.184] [2:0.448] [N/A:N/A] % Reduction in Area: [1:-50.00%] [  2:80.60%] [N/A:N/A] % Reduction in Volume: 0.00% [2:61.20%] [N/A:N/A] Classification: [1:Full Thickness Without Exposed Support Structures] [2:Full Thickness Without Exposed Support Structures] [N/A:N/A] Exudate Amount: [1:Large] [2:Large] [N/A:N/A] Exudate Type: [1:Purulent] [2:Serosanguineous] [N/A:N/A] Exudate Color: [1:yellow, brown, green] [2:red, brown] [N/A:N/A] Wound Margin: [1:Indistinct, nonvisible] [2:Indistinct, nonvisible] [N/A:N/A] Granulation Amount: [1:Medium (34-66%)] [2:Medium (34-66%)] [N/A:N/A] Granulation Quality: [1:Red, Pink] [2:Pink] [N/A:N/A] Necrotic Amount: [1:Medium (34-66%)] [2:Medium (34-66%)] [N/A:N/A] Exposed Structures: [1:Fat Layer (Subcutaneous Tissue) Exposed: Yes] [2:Fat Layer (Subcutaneous Tissue) Exposed: Yes] [N/A:N/A] Fascia: No Fascia: No Tendon: No Tendon: No Muscle: No Muscle: No Joint: No Joint: No Bone: No Bone: No Epithelialization: Small  (1-33%) Small (1-33%) N/A Debridement: Debridement (40981- Debridement (19147- N/A 11047) 11047) Pre-procedure 08:21 08:21 N/A Verification/Time Out Taken: Pain Control: Lidocaine 4% Topical Lidocaine 4% Topical N/A Solution Solution Tissue Debrided: Fibrin/Slough, Exudates, Fibrin/Slough, Exudates, N/A Subcutaneous Subcutaneous Level: Skin/Subcutaneous Skin/Subcutaneous N/A Tissue Tissue Debridement Area (sq 33 2.85 N/A cm): Instrument: Other(scoop) Other(scoop) N/A Bleeding: Minimum Minimum N/A Hemostasis Achieved: Pressure Pressure N/A Procedural Pain: 0 0 N/A Post Procedural Pain: 0 0 N/A Debridement Treatment Procedure was tolerated Procedure was tolerated N/A Response: well well Post Debridement 6x5.5x0.2 1.9x1.5x0.2 N/A Measurements L x W x D (cm) Post Debridement 5.184 0.448 N/A Volume: (cm) Periwound Skin Texture: Scarring: Yes Scarring: Yes N/A Excoriation: No Excoriation: No Induration: No Induration: No Callus: No Callus: No Crepitus: No Crepitus: No Rash: No Rash: No Periwound Skin Maceration: Yes Maceration: No N/A Moisture: Dry/Scaly: No Dry/Scaly: No Periwound Skin Color: Ecchymosis: Yes Ecchymosis: Yes N/A Atrophie Blanche: No Atrophie Blanche: No Cyanosis: No Cyanosis: No Erythema: No Erythema: No Hemosiderin Staining: No Hemosiderin Staining: No Mottled: No Mottled: No Pallor: No Pallor: No Rubor: No Rubor: No Temperature: No Abnormality No Abnormality N/A Tenderness on Yes Yes N/A Palpation: LORNE, WINKELS ([id number]) Wound Preparation: Ulcer Cleansing: Other: Ulcer Cleansing: Other: N/A soap and water soap and water Topical Anesthetic Topical Anesthetic Applied: None, Other: Applied: None, Other: lidocaine 4% lidocaine 4% Procedures Performed: Debridement Debridement N/A Treatment Notes Electronic Signature(s) Signed: 03/12/2017 5:59:51 PM By: [name] MD Entered By: [name] on 03/12/2017  08:27:00 [name] ([id number]) -------------------------------------------------------------------------------- Multi-Disciplinary Care Plan Details Patient Name: [name] Date of Service: 03/12/2017 8:00 AM Medical Record Number: [id number] Patient Account Number: [id number] Date of Birth/Sex: 04-20-1946 (71 y.o. Male) Treating RN: [name] Primary Care Chrsitopher Wik: [name] Other Clinician: Referring Harlee Eckroth: [name] Treating Michaelina Blandino/Extender: [name] Weeks in Treatment: 60 Active Inactive ` Venous Leg Ulcer Nursing Diagnoses: Knowledge deficit related to disease process and management Goals: Patient will maintain optimal edema control Date Initiated: 04/03/2016 Target Resolution Date: 11/24/2016 Goal Status: Active Interventions: Compression as ordered Treatment Activities: Therapeutic compression applied : 04/03/2016 Notes: ` Wound/Skin Impairment Nursing Diagnoses: Impaired tissue integrity Goals: Ulcer/skin breakdown will heal within 14 weeks Date Initiated: 01/17/2016 Target Resolution Date: 11/24/2016 Goal Status: Active Interventions: Assess ulceration(s) every visit Notes: Electronic Signature(s) Signed: 03/13/2017 5:03:57 PM By: Aurelio Jew, [name] ([id number]) Entered By: Alejandro Mulling on 03/12/2017 08:20:27 [name] ([id number]) -------------------------------------------------------------------------------- Pain Assessment Details Patient Name: [name] Date of Service: 03/12/2017 8:00 AM Medical Record Number: [id number] Patient Account Number: [id number] Date of Birth/Sex: 11/25/1945 (71 y.o. Male) Treating RN: [name] Primary Care Jackston [place]: [name] Other Clinician: Referring Mcdaniel Ohms: [name] Treating Adasha Boehme/Extender: [name] Weeks in Treatment: 60 Active Problems Location of Pain Severity and Description of Pain Patient Has Paino  No Site Locations With Dressing Change: No Pain Management  and Medication Current Pain Management: Electronic Signature(s) Signed: 03/13/2017 5:03:57 PM By: Alejandro Mulling Entered By: Alejandro Mulling on 03/12/2017 08:07:00 [name] ([id number]) -------------------------------------------------------------------------------- Patient/Caregiver Education Details [name] Date of Service: 03/12/2017 8:00 AM Patient Name: C. Patient Account Number: [id number] Medical Record Treating RN: [name] [id number] Number: Other Clinician: Date of Birth/Gender: Feb 03, 1946 (71 y.o. Male) Treating [name], [name] Primary Care Physician: [name] Physician/Extender: G Referring Physician: [name] in Treatment: 41 Education Assessment Education Provided To: Patient Education Topics Provided Wound/Skin Impairment: Handouts: Other: change dressing as ordered Methods: Demonstration, Explain/Verbal Responses: State content correctly Electronic Signature(s) Signed: 03/13/2017 5:03:57 PM By: Alejandro Mulling Entered By: Alejandro Mulling on 03/12/2017 08:21:58 [name] ([id number]) -------------------------------------------------------------------------------- Wound Assessment Details Patient Name: [name] Date of Service: 03/12/2017 8:00 AM Medical Record Number: [id number] Patient Account Number: [id number] Date of Birth/Sex: 06/25/1946 (71 y.o. Male) Treating RN: Ashok Cordia, [name] Primary Care Mylie Mccurley: [name] Other Clinician: Referring Malcolm Quast: [name] Treating Manreet Kiernan/Extender: [name] Weeks in Treatment: 60 Wound Status Wound Number: 1 Primary Venous Leg Ulcer Etiology: Wound Location: Right Malleolus - Lateral Wound Open Wounding Event: Gradually Appeared Status: Date Acquired: 08/11/2015 Comorbid Cataracts, Chronic Obstructive Weeks Of Treatment: 60 History: Pulmonary Disease (COPD), Clustered  Wound: No Osteoarthritis Photos Photo Uploaded By: Alejandro Mulling on 03/12/2017 17:22:19 Wound Measurements Length: (cm) 6 Width: (cm) 5.5 Depth: (cm) 0.2 Area: (cm) 25.918 Volume: (cm) 5.184 % Reduction in Area: -50% % Reduction in Volume: 0% Epithelialization: Small (1-33%) Tunneling: No Undermining: No Wound Description Full Thickness Without Exposed Foul Odor After Classification: Support Structures Slough/Fibrino Wound Margin: Indistinct, nonvisible Exudate Large Amount: Exudate Type: Purulent Exudate Color: yellow, brown, green Cleansing: No Yes Wound Bed Granulation Amount: Medium (34-66%) Exposed Structure Granulation Quality: Red, Pink Fascia Exposed: No Bilger, Doryan C. ([id number]) Necrotic Amount: Medium (34-66%) Fat Layer (Subcutaneous Tissue) Exposed: Yes Necrotic Quality: Adherent Slough Tendon Exposed: No Muscle Exposed: No Joint Exposed: No Bone Exposed: No Periwound Skin Texture Texture Color No Abnormalities Noted: No No Abnormalities Noted: No Callus: No Atrophie Blanche: No Crepitus: No Cyanosis: No Excoriation: No Ecchymosis: Yes Induration: No Erythema: No Rash: No Hemosiderin Staining: No Scarring: Yes Mottled: No Pallor: No Moisture Rubor: No No Abnormalities Noted: No Dry / Scaly: No Temperature / Pain Maceration: Yes Temperature: No Abnormality Tenderness on Palpation: Yes Wound Preparation Ulcer Cleansing: Other: soap and water, Topical Anesthetic Applied: None, Other: lidocaine 4%, Treatment Notes Wound #1 (Right, Lateral Malleolus) 1. Cleansed with: Clean wound with Normal Saline Cleanse wound with antibacterial soap and water 2. Anesthetic Topical Lidocaine 4% cream to wound bed prior to debridement 3. Peri-wound Care: Barrier cream Moisturizing lotion 4. Dressing Applied: Hydrafera Blue 5. Secondary Dressing Applied ABD Pad 7. Secured with Tape 3 Layer Compression System - Right Lower  Extremity Notes unna to anchor, drawtex, xtrasorb Electronic Signature(s) Signed: 03/13/2017 5:03:57 PM By: Aurelio Jew, [name] ([id number]) Entered By: Alejandro Mulling on 03/12/2017 08:17:41 [name] ([id number]) -------------------------------------------------------------------------------- Wound Assessment Details Patient Name: [name] Date of Service: 03/12/2017 8:00 AM Medical Record Number: [id number] Patient Account Number: [id number] Date of Birth/Sex: [birth year] (71 y.o. Male) Treating RN: [name] Primary Care Shanaia Sievers: [name] Other Clinician: Referring Ashrita Chrismer: [name] Treating Annalena Piatt/Extender: [name] Weeks in Treatment: 60 Wound Status Wound Number: 2 Primary Venous Leg Ulcer Etiology: Wound Location: Right Malleolus - Medial Wound Open Wounding Event: Gradually Appeared Status: Date Acquired: 01/30/2016 Comorbid Cataracts, Chronic Obstructive Weeks Of Treatment:  58 History: Pulmonary Disease (COPD), Clustered Wound: Yes Osteoarthritis Photos Photo Uploaded By: Alejandro Mulling on 03/12/2017 17:22:19 Wound Measurements Length: (cm) 1.9 Width: (cm) 1.5 Depth: (cm) 0.2 Area: (cm) 2.238 Volume: (cm) 0.448 % Reduction in Area: 80.6% % Reduction in Volume: 61.2% Epithelialization: Small (1-33%) Tunneling: No Undermining: No Wound Description Full Thickness Without Exposed Foul Odor Afte Classification: Support Structures Slough/Fibrino Wound Margin: Indistinct, nonvisible Exudate Large Amount: Exudate Type: Serosanguineous Exudate Color: red, brown r Cleansing: No Yes Wound Bed Granulation Amount: Medium (34-66%) Exposed Structure Granulation Quality: Pink Fascia Exposed: No Coutant, Stpehen C. ([id number]) Necrotic Amount: Medium (34-66%) Fat Layer (Subcutaneous Tissue) Exposed: Yes Necrotic Quality: Adherent Slough Tendon Exposed: No Muscle Exposed: No Joint Exposed:  No Bone Exposed: No Periwound Skin Texture Texture Color No Abnormalities Noted: No No Abnormalities Noted: No Callus: No Atrophie Blanche: No Crepitus: No Cyanosis: No Excoriation: No Ecchymosis: Yes Induration: No Erythema: No Rash: No Hemosiderin Staining: No Scarring: Yes Mottled: No Pallor: No Moisture Rubor: No No Abnormalities Noted: No Dry / Scaly: No Temperature / Pain Maceration: No Temperature: No Abnormality Tenderness on Palpation: Yes Wound Preparation Ulcer Cleansing: Other: soap and water, Topical Anesthetic Applied: None, Other: lidocaine 4%, Treatment Notes Wound #2 (Right, Medial Malleolus) 1. Cleansed with: Clean wound with Normal Saline Cleanse wound with antibacterial soap and water 2. Anesthetic Topical Lidocaine 4% cream to wound bed prior to debridement 3. Peri-wound Care: Barrier cream Moisturizing lotion 4. Dressing Applied: Hydrafera Blue 5. Secondary Dressing Applied ABD Pad 7. Secured with Tape 3 Layer Compression System - Right Lower Extremity Notes unna to anchor, drawtex, xtrasorb Electronic Signature(s) Signed: 03/13/2017 5:03:57 PM By: Aurelio Jew, [name] ([id number]) Entered By: Alejandro Mulling on 03/12/2017 08:19:49 [name] ([id number]) -------------------------------------------------------------------------------- Vitals Details Patient Name: [name] Date of Service: 03/12/2017 8:00 AM Medical Record Number: [id number] Patient Account Number: [id number] Date of Birth/Sex: [birth year] (71 y.o. Male) Treating RN: Ashok Cordia, [name] Primary Care Saagar Tortorella: [name] Other Clinician: Referring Miroslav Gin: [name] Treating Cortina Vultaggio/Extender: [name] Weeks in Treatment: 60 Vital Signs Time Taken: 08:07 Temperature (F): 98.0 Height (in): 76 Pulse (bpm): 77 Weight (lbs): 238 Respiratory Rate (breaths/min): 18 Body Mass Index (BMI): 29 Blood Pressure (mmHg):  130/60 Reference Range: 80 - 120 mg / dl Electronic Signature(s) Signed: 03/13/2017 5:03:57 PM By: Alejandro Mulling Entered By: Alejandro Mulling on 03/12/2017 08:07:20

## 2017-03-17 NOTE — Progress Notes (Signed)
CORVIN, SORBO ([id number]) Visit Report for 03/15/2017 Arrival Information Details Patient Name: [name], [name]. Date of Service: 03/15/2017 8:00 AM Medical Record Number: [id number] Patient Account Number: [id number] Date of Birth/Sex: 1945-10-29 (71 y.o. Male) Treating RN: Huel Coventry Primary Care Nelani Schmelzle: [name] Other Clinician: Referring Kieana Livesay: [name] Treating [place]/Extender: Rudene Re in Treatment: 60 Visit Information History Since Last Visit Added or deleted any medications: No Patient Arrived: Ambulatory Any new allergies or adverse reactions: No Arrival Time: 07:49 Had a fall or experienced change in No Accompanied By: self activities of daily living that may affect Transfer Assistance: None risk of falls: Patient Identification Verified: Yes Signs or symptoms of abuse/neglect No Secondary Verification Process Yes since last visito Completed: Hospitalized since last visit: No Patient Requires Transmission-Based No Has Dressing in Place as Prescribed: Yes Precautions: Has Compression in Place as Yes Patient Has Alerts: No Prescribed: Has Footwear/Offloading in Place as Yes Prescribed: Right: Surgical Shoe with Pressure Relief Insole Pain Present Now: No Electronic Signature(s) Signed: 03/15/2017 4:53:37 PM By: Elliot Gurney, BSN, RN, CWS, Kim RN, BSN Entered By: Elliot Gurney, BSN, RN, CWS, Kim on 03/15/2017 07:49:43 [name] ([id number]) -------------------------------------------------------------------------------- Compression Therapy Details Patient Name: [name] Date of Service: 03/15/2017 8:00 AM Medical Record Number: [id number] Patient Account Number: [id number] Date of Birth/Sex: [birth year] (71 y.o. Male) Treating RN: Huel Coventry Primary Care Letisia Schwalb: [name] Other Clinician: Referring Destiny Hagin: [name] Treating Orval Dortch/Extender: Rudene Re in Treatment: 60 Compression Therapy  Performed for Wound Wound #1 Right,Lateral Malleolus Assessment: Performed By: Cletus Gash, RN Compression Type: Three Layer Pre Treatment ABI: 1.2 Electronic Signature(s) Signed: 03/15/2017 4:53:37 PM By: Elliot Gurney, BSN, RN, CWS, Kim RN, BSN Entered By: Elliot Gurney, BSN, RN, CWS, Kim on 03/15/2017 07:55:52 [name] ([id number]) -------------------------------------------------------------------------------- Encounter Discharge Information Details Patient Name: [name] Date of Service: 03/15/2017 8:00 AM Medical Record Number: [id number] Patient Account Number: [id number] Date of Birth/Sex: 05/06/1946 (71 y.o. Male) Treating RN: Huel Coventry Primary Care Audel Coakley: [name] Other Clinician: Referring Davin Archuletta: [name] Treating Khyre Germond/Extender: Rudene Re in Treatment: 18 Encounter Discharge Information Items Discharge Pain Level: 0 Discharge Condition: Stable Ambulatory Status: Ambulatory Discharge Destination: Home Private Transportation: Auto Accompanied By: self Schedule Follow-up Appointment: Yes Medication Reconciliation completed and Yes provided to Patient/Care Kedric Bumgarner: Clinical Summary of Care: Electronic Signature(s) Signed: 03/15/2017 8:09:21 AM By: Elliot Gurney, BSN, RN, CWS, Kim RN, BSN Entered By: Elliot Gurney, BSN, RN, CWS, Kim on 03/15/2017 08:09:21 [name] ([id number]) -------------------------------------------------------------------------------- Patient/Caregiver Education Details Patient Name: [name] Date of Service: 03/15/2017 8:00 AM Medical Record Number: [id number] Patient Account Number: [id number] Date of Birth/Gender: [birth year] (71 y.o. Male) Treating RN: Huel Coventry Primary Care Physician: [name] Other Clinician: Referring Physician: [name] Treating Physician/Extender: Rudene Re in Treatment: 65 Education Assessment Education Provided To: Caregiver Education Topics  Provided Venous: Handouts: Controlling Swelling with Compression Stockings Methods: Demonstration, Explain/Verbal Responses: State content correctly Wound/Skin Impairment: Handouts: Caring for Your Ulcer Methods: Demonstration, Explain/Verbal Responses: State content correctly Electronic Signature(s) Signed: 03/15/2017 4:53:37 PM By: Elliot Gurney, BSN, RN, CWS, Kim RN, BSN Entered By: Elliot Gurney, BSN, RN, CWS, Kim on 03/15/2017 08:09:01 [name] ([id number]) -------------------------------------------------------------------------------- Wound Assessment Details Patient Name: [name] Date of Service: 03/15/2017 8:00 AM Medical Record Number: [id number] Patient Account Number: [id number] Date of Birth/Sex: 30-Sep-1945 (71 y.o. Male) Treating RN: Huel Coventry Primary Care Alaa Eyerman: [name] Other Clinician: Referring Loretta Doutt: [name] Treating Abhimanyu Cruces/Extender: Rudene Re in Treatment:  60 Wound Status Wound Number: 1 Primary Etiology: Venous Leg Ulcer Wound Location: Right, Lateral Malleolus Wound Status: Open Wounding Event: Gradually Appeared Date Acquired: 08/11/2015 Weeks Of Treatment: 60 Clustered Wound: No Photos Photo Uploaded By: Elliot GurneyWoody, BSN, RN, CWS, Kim on 03/15/2017 16:23:09 Wound Measurements Length: (cm) 5 Width: (cm) 4.8 Depth: (cm) 0.2 Area: (cm) 18.85 Volume: (cm) 3.77 % Reduction in Area: -9.1% % Reduction in Volume: 27.3% Wound Description Full Thickness Without Exposed Classification: Support Structures Periwound Skin Texture Texture Color No Abnormalities Noted: No No Abnormalities Noted: No Moisture No Abnormalities Noted: No Treatment Notes Wound #1 (Right, Lateral Malleolus) 1. Cleansed with: Clean wound with Normal Saline Gaubert, Saulo C. ([id number]) Cleanse wound with antibacterial soap and water 2. Anesthetic Topical Lidocaine 4% cream to wound bed prior to debridement 3. Peri-wound Care: Barrier  cream Moisturizing lotion 4. Dressing Applied: Hydrafera Blue 5. Secondary Dressing Applied ABD Pad 7. Secured with Tape 3 Layer Compression System - Right Lower Extremity Notes unna to anchor, drawtex, Armed forces operational officerxtrasorb Electronic Signature(s) Signed: 03/15/2017 4:53:37 PM By: Elliot GurneyWoody, BSN, RN, CWS, Kim RN, BSN Entered By: Elliot GurneyWoody, BSN, RN, CWS, Kim on 03/15/2017 07:55:28 [name], [name] ([id number]) -------------------------------------------------------------------------------- Wound Assessment Details Patient Name: [name], [name] Date of Service: 03/15/2017 8:00 AM Medical Record Number: [id number] Patient Account Number: [id number] Date of Birth/Sex: 1946-03-10 72(70 y.o. Male) Treating RN: Huel CoventryWoody, Kim Primary Care Teal Bontrager: Darreld McleanMILES, LINDA Other Clinician: Referring Vong Garringer: Darreld McleanMILES, LINDA Treating Maninder Deboer/Extender: Rudene ReBritto, Errol Weeks in Treatment: 60 Wound Status Wound Number: 2 Primary Etiology: Venous Leg Ulcer Wound Location: Right, Medial Malleolus Wound Status: Open Wounding Event: Gradually Appeared Date Acquired: 01/30/2016 Weeks Of Treatment: 58 Clustered Wound: Yes Photos Photo Uploaded By: Elliot GurneyWoody, BSN, RN, CWS, Kim on 03/15/2017 16:23:10 Wound Measurements Length: (cm) 1.9 Width: (cm) 1.5 Depth: (cm) 0.2 Area: (cm) 2.238 Volume: (cm) 0.448 % Reduction in Area: 80.6% % Reduction in Volume: 61.2% Wound Description Full Thickness Without Exposed Classification: Support Structures Periwound Skin Texture Texture Color No Abnormalities Noted: No No Abnormalities Noted: No Moisture No Abnormalities Noted: No Treatment Notes Wound #2 (Right, Medial Malleolus) 1. Cleansed with: Clean wound with Normal Saline Charters, Lyric C. ([id number]) Cleanse wound with antibacterial soap and water 2. Anesthetic Topical Lidocaine 4% cream to wound bed prior to debridement 3. Peri-wound Care: Barrier cream Moisturizing lotion 4. Dressing Applied: Hydrafera  Blue 5. Secondary Dressing Applied ABD Pad 7. Secured with Tape 3 Layer Compression System - Right Lower Extremity Notes unna to anchor, drawtex, [employer] forces operational officerxtrasorb Electronic Signature(s) Signed: 03/15/2017 4:53:37 PM By: Elliot GurneyWoody, BSN, RN, CWS, Kim RN, BSN Entered By: Elliot GurneyWoody, BSN, RN, CWS, Kim on 03/15/2017 07:55:28

## 2017-03-19 ENCOUNTER — Encounter: Payer: Medicare HMO | Admitting: Internal Medicine

## 2017-03-19 DIAGNOSIS — I87331 Chronic venous hypertension (idiopathic) with ulcer and inflammation of right lower extremity: Secondary | ICD-10-CM | POA: Diagnosis not present

## 2017-03-20 NOTE — Progress Notes (Signed)
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 Scarring, Maceration. Periwound temperature was noted as No Abnormality. The periwound has tenderness on palpation. 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 There was a Skin/Subcutaneous Tissue Debridement ([id number]) debridement with total area of 24 sq cm performed by [name], MD. with the following instrument(s): Curette to remove Viable and Non-Viable tissue/material including Exudate, Fibrin/Slough, and Subcutaneous after achieving pain control using Lidocaine 4% Topical Solution. A time out was conducted at 08:26, prior to the start of the procedure. A Minimum amount of bleeding was controlled with Pressure. The procedure was tolerated well with a pain level of 0 throughout and a pain level of 0 following the procedure. Post Debridement  Measurements: 5cm length x 4.8cm width x 0.2cm depth; 3.77cm^3 volume. Character of Wound/Ulcer Post Debridement requires further debridement. Severity of Tissue Post Debridement is: Fat layer exposed. 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 There was a Skin/Subcutaneous Tissue Debridement ([id number](11042-11047) debridement with total area of 3.61 sq cm performed by Maxwell CaulOBSON, Stephania Macfarlane G, MD. with the following instrument(s): Curette to remove Viable and Non-Viable tissue/material including Exudate, Fibrin/Slough, and Subcutaneous after achieving pain control using Lidocaine 4% Topical Solution. A time out was conducted at 08:26, prior to the start of the procedure. A Minimum amount of bleeding was controlled with Pressure. The procedure was tolerated well with a pain level of 0 throughout and a pain level of 0 following the procedure. Post  Debridement Measurements: 1.9cm length x 1.9cm width x 0.2cm depth; 0.567cm^3 volume. Character of Wound/Ulcer Post Debridement requires further debridement. Severity of Tissue Post Debridement is: Fat layer exposed. Post procedure Diagnosis Wound #2: Same as Pre-Procedure [name], Shell C. ([id number]) Plan #1 continued debridement each visit #2 continued Hydrofera Blue #3 continued twice-weekly dressing changes secondary to large amounts of drainage. #4 we have been making progress with the current treatment regimen. I have no plans to change this as long as the wound continues to gradually Research officer, trade unioncontract Electronic Signature(s) Signed: 03/19/2017 5:23:04 PM By: Baltazar Najjarobson, Kanijah Groseclose MD Entered By: Baltazar Najjarobson, Kamisha Ell on 03/19/2017 08:39:50 [name], Emma C. ([id number]) -------------------------------------------------------------------------------- SuperBill Details [name], Matti Date of Service: 03/19/2017 Patient Name: C. Patient Account Number: [id number] Medical Record Treating RN: Phillis Haggisinkerton, [name] [id number] Number: Other Clinician: Date of Birth/Sex: 1946-01-22 [age] y.o. 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, initial T85.613A encounter Disruption of external operation (surgical) wound, not elsewhere classified, subsequent T81.31XD encounter L97.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 of skin Facility Procedures CPT4: Description Modifier Quantity Code [id number] 11042 - DEB SUBQ  TISSUE 20 SQ CM/< 1 ICD-10 Description Diagnosis I87.331 Chronic venous hypertension (idiopathic) with ulcer and inflammation of right lower extremity CPT4: 5176160736100018 11045 - DEB SUBQ TISS EA ADDL 20CM 1 ICD-10 Description Diagnosis L97.212 Non-pressure chronic ulcer of right calf with fat layer exposed Physician Procedures CPT4: Description Modifier Quantity Code [id number] 11042 - WC PHYS SUBQ TISS 20 SQ CM 1 ICD-10 Description Diagnosis I87.331 Chronic venous hypertension (idiopathic) with ulcer and inflammation of right lower extremity [name], Tommey C. ([id number]) Electronic Signature(s) Signed: 03/19/2017 5:23:04 PM By: Baltazar Najjarobson, Federick Levene MD Entered By: Baltazar Najjarobson, Odette Watanabe on 03/19/2017 08:40:39

## 2017-03-20 NOTE — Progress Notes (Signed)
LINTON, STOLP ([id number]) Visit Report for 03/19/2017 Arrival Information Details Patient Name: [name], [name]. Date of Service: 03/19/2017 8:00 AM Medical Record Number: [id number] Patient Account Number: [id number] Date of Birth/Sex: 1945/10/29 (71 y.o. Male) Treating RN: [name] Primary Care Woodroe Vogan: [name] Other Clinician: Referring Ramere Downs: [name] Treating Jazia Faraci/Extender: Altamese Dante in Treatment: 61 Visit Information History Since Last Visit All ordered tests and consults were completed: No Patient Arrived: Ambulatory Added or deleted any medications: No Arrival Time: 08:06 Any new allergies or adverse reactions: No Accompanied By: self Had a fall or experienced change in No Transfer Assistance: None activities of daily living that may affect Patient Identification Verified: Yes risk of falls: Secondary Verification Process Yes Signs or symptoms of abuse/neglect since last No Completed: visito Patient Requires Transmission-Based No Hospitalized since last visit: No Precautions: Has Dressing in Place as Prescribed: Yes Patient Has Alerts: No Has Compression in Place as Prescribed: Yes Pain Present Now: No Electronic Signature(s) Signed: 03/19/2017 4:38:41 PM By: Alejandro Mulling Entered By: Alejandro Mulling on 03/19/2017 08:06:44 [name] ([id number]) -------------------------------------------------------------------------------- Encounter Discharge Information Details Patient Name: [name] Date of Service: 03/19/2017 8:00 AM Medical Record Number: [id number] Patient Account Number: [id number] Date of Birth/Sex: 1946-05-27 (71 y.o. Male) Treating RN: [name] Primary Care Yajaira Doffing: [name] Other Clinician: Referring Akeem Heppler: [name] Treating Dayleen Beske/Extender: [name] Weeks in Treatment: 26 Encounter Discharge Information Items Discharge Pain Level: 0 Discharge  Condition: Stable Ambulatory Status: Ambulatory Discharge Destination: Home Transportation: Private Auto Accompanied By: self Schedule Follow-up Appointment: Yes Medication Reconciliation completed No and provided to Patient/Care Marquist Binstock: Patient Clinical Summary of Care: Declined Electronic Signature(s) Signed: 03/19/2017 8:44:55 AM By: Gwenlyn Perking Entered By: Gwenlyn Perking on 03/19/2017 08:44:54 [name] ([id number]) -------------------------------------------------------------------------------- Lower Extremity Assessment Details Patient Name: [name] Date of Service: 03/19/2017 8:00 AM Medical Record Number: [id number] Patient Account Number: [id number] Date of Birth/Sex: 12-Aug-1946 (71 y.o. Male) Treating RN: [name] Primary Care Aleen Marston: [name] Other Clinician: Referring Laquinn Shippy: [name] Treating Dhruva Orndoff/Extender: [name] Weeks in Treatment: 61 Vascular Assessment Pulses: Dorsalis Pedis Palpable: [Right:Yes] Posterior Tibial Extremity colors, hair growth, and conditions: Extremity Color: [Right:Normal] Temperature of Extremity: [Right:Warm] Capillary Refill: [Right:< 3 seconds] Electronic Signature(s) Signed: 03/19/2017 4:38:41 PM By: Alejandro Mulling Entered By: Alejandro Mulling on 03/19/2017 08:23:21 [name] ([id number]) -------------------------------------------------------------------------------- Multi Wound Chart Details Patient Name: [name] Date of Service: 03/19/2017 8:00 AM Medical Record Number: [id number] Patient Account Number: [id number] Date of Birth/Sex: 1945/09/17 (71 y.o. Male) Treating RN: Ashok Cordia, [name] Primary Care Antionetta Ator: [name] Other Clinician: Referring Endya Austin: [name] Treating Ambree Frances/Extender: [name] Weeks in Treatment: 61 Vital Signs Height(in): 76 Pulse(bpm): 90 Weight(lbs): 238 Blood Pressure 124/60 (mmHg): Body Mass  Index(BMI): 29 Temperature(F): 98.5 Respiratory Rate 18 (breaths/min): Photos: [1:No Photos] [2:No Photos] [N/A:N/A] Wound Location: [1:Right Malleolus - Lateral] [2:Right Malleolus - Medial] [N/A:N/A] Wounding Event: [1:Gradually Appeared] [2:Gradually Appeared] [N/A:N/A] Primary Etiology: [1:Venous Leg Ulcer] [2:Venous Leg Ulcer] [N/A:N/A] Comorbid History: [1:Cataracts, Chronic Obstructive Pulmonary Disease (COPD), Osteoarthritis] [2:Cataracts, Chronic Obstructive Pulmonary Disease (COPD), Osteoarthritis] [N/A:N/A] Date Acquired: [1:08/11/2015] [2:01/30/2016] [N/A:N/A] Weeks of Treatment: [1:61] [2:59] [N/A:N/A] Wound Status: [1:Open] [2:Open] [N/A:N/A] Clustered Wound: [1:No] [2:Yes] [N/A:N/A] Measurements L x W x D 5x4.8x0.2 [2:1.9x1.9x0.2] [N/A:N/A] (cm) Area (cm) : [1:18.85] [2:2.835] [N/A:N/A] Volume (cm) : [1:3.77] [2:0.567] [N/A:N/A] % Reduction in Area: [1:-9.10%] [2:75.40%] [N/A:N/A] % Reduction in Volume: 27.30% [2:50.90%] [N/A:N/A] Classification: [1:Full Thickness Without Exposed Support Structures] [  2:Full Thickness Without Exposed Support Structures] [N/A:N/A] Exudate Amount: [1:Large] [2:Large] [N/A:N/A] Exudate Type: [1:Serous] [2:Serosanguineous] [N/A:N/A] Exudate Color: [1:amber] [2:red, brown] [N/A:N/A] Wound Margin: [1:Distinct, outline attached] [2:Distinct, outline attached] [N/A:N/A] Granulation Amount: [1:Small (1-33%)] [2:Medium (34-66%)] [N/A:N/A] Granulation Quality: [1:Red] [2:Red] [N/A:N/A] Necrotic Amount: [1:Large (67-100%)] [2:Medium (34-66%)] [N/A:N/A] Epithelialization: [1:Small (1-33%)] [2:Small (1-33%)] [N/A:N/A] Periwound Skin Texture: Scarring: Yes [2:Scarring: Yes] [N/A:N/A] Periwound Skin Maceration: Yes Maceration: Yes N/A Moisture: Periwound Skin Color: No Abnormalities Noted No Abnormalities Noted N/A Temperature: No Abnormality No Abnormality N/A Tenderness on Yes Yes N/A Palpation: Wound Preparation: Ulcer Cleansing: Ulcer  Cleansing: N/A Rinsed/Irrigated with Rinsed/Irrigated with Saline Saline Topical Anesthetic Topical Anesthetic Applied: Other: lidocaine Applied: Other: lidocaine 4% 4% Treatment Notes Electronic Signature(s) Signed: 03/19/2017 5:23:04 PM By: [name] MD Entered By: [name] on 03/19/2017 08:33:30 [name] ([id number]) -------------------------------------------------------------------------------- Multi-Disciplinary Care Plan Details Patient Name: [name] Date of Service: 03/19/2017 8:00 AM Medical Record Number: [id number] Patient Account Number: [id number] Date of Birth/Sex: 16-Aug-1946 (71 y.o. Male) Treating RN: [name] Primary Care Uchechi Denison: [name] Other Clinician: Referring Erielle Gawronski: [name] Treating Timtohy Broski/Extender: [name] Weeks in Treatment: 61 Active Inactive ` Venous Leg Ulcer Nursing Diagnoses: Knowledge deficit related to disease process and management Goals: Patient will maintain optimal edema control Date Initiated: 04/03/2016 Target Resolution Date: 11/24/2016 Goal Status: Active Interventions: Compression as ordered Treatment Activities: Therapeutic compression applied : 04/03/2016 Notes: ` Wound/Skin Impairment Nursing Diagnoses: Impaired tissue integrity Goals: Ulcer/skin breakdown will heal within 14 weeks Date Initiated: 01/17/2016 Target Resolution Date: 11/24/2016 Goal Status: Active Interventions: Assess ulceration(s) every visit Notes: Electronic Signature(s) Signed: 03/19/2017 4:38:41 PM By: Aurelio Jew, [name] ([id number]) Entered By: Alejandro Mulling on 03/19/2017 08:25:24 [name] ([id number]) -------------------------------------------------------------------------------- Pain Assessment Details Patient Name: [name] Date of Service: 03/19/2017 8:00 AM Medical Record Number: [id number] Patient Account Number: [id number] Date of  Birth/Sex: 20-Aug-1946 (71 y.o. Male) Treating RN: [name] Primary Care Zya Finkle: [name] Other Clinician: Referring Ashante Snelling: [name] Treating Keishla Oyer/Extender: [name] Weeks in Treatment: 61 Active Problems Location of Pain Severity and Description of Pain Patient Has Paino No Site Locations With Dressing Change: No Pain Management and Medication Current Pain Management: Electronic Signature(s) Signed: 03/19/2017 4:38:41 PM By: Alejandro Mulling Entered By: Alejandro Mulling on 03/19/2017 08:06:52 [name] ([id number]) -------------------------------------------------------------------------------- Patient/Caregiver Education Details [name] Date of Service: 03/19/2017 8:00 AM Patient Name: C. Patient Account Number: [id number] Medical Record Treating RN: [name] [id number] Number: Other Clinician: Date of Birth/Gender: Nov 29, 1945 (71 y.o. Male) Treating [name], [name] Primary Care Physician: [name] Physician/Extender: G Referring Physician: [name] in Treatment: 41 Education Assessment Education Provided To: Patient Education Topics Provided Wound/Skin Impairment: Handouts: Other: change dressing as ordered Methods: Demonstration, Explain/Verbal Responses: State content correctly Electronic Signature(s) Signed: 03/19/2017 4:38:41 PM By: Alejandro Mulling Entered By: Alejandro Mulling on 03/19/2017 08:09:55 [name] ([id number]) -------------------------------------------------------------------------------- Wound Assessment Details Patient Name: [name] Date of Service: 03/19/2017 8:00 AM Medical Record Number: [id number] Patient Account Number: [id number] Date of Birth/Sex: [birth year] (71 y.o. Male) Treating RN: [name] Primary Care Treydon Henricks: [name] Other Clinician: Referring Demonica Farrey: [name] Treating Chinelo Benn/Extender: [name] Weeks in  Treatment: 61 Wound Status Wound Number: 1 Primary Venous Leg Ulcer Etiology: Wound Location: Right Malleolus - Lateral Wound Open Wounding Event: Gradually Appeared Status: Date Acquired: 08/11/2015 Comorbid Cataracts, Chronic Obstructive Weeks Of Treatment: 61 History: Pulmonary Disease (COPD), Clustered Wound: No Osteoarthritis Photos Photo Uploaded  By: Alejandro MullingPinkerton, Debra on 03/19/2017 09:40:26 Wound Measurements Length: (cm) 5 Width: (cm) 4.8 Depth: (cm) 0.2 Area: (cm) 18.85 Volume: (cm) 3.77 % Reduction in Area: -9.1% % Reduction in Volume: 27.3% Epithelialization: Small (1-33%) Tunneling: No Undermining: No Wound Description Full Thickness Without Exposed Foul Odor After Classification: Support Structures Slough/Fibrino Wound Margin: Distinct, outline attached Exudate Large Amount: Exudate Type: Serous Exudate Color: amber Cleansing: No Yes Wound Bed Granulation Amount: Small (1-33%) Granulation Quality: Red Bagshaw, [name] ([id number]) Necrotic Amount: Large (67-100%) Necrotic Quality: Adherent Slough Periwound Skin Texture Texture Color No Abnormalities Noted: No No Abnormalities Noted: No Scarring: Yes Temperature / Pain Moisture Temperature: No Abnormality No Abnormalities Noted: No Tenderness on Palpation: Yes Maceration: Yes Wound Preparation Ulcer Cleansing: Rinsed/Irrigated with Saline Topical Anesthetic Applied: Other: lidocaine 4%, Treatment Notes Wound #1 (Right, Lateral Malleolus) 1. Cleansed with: Clean wound with Normal Saline Cleanse wound with antibacterial soap and water 2. Anesthetic Topical Lidocaine 4% cream to wound bed prior to debridement 3. Peri-wound Care: Barrier cream Moisturizing lotion 4. Dressing Applied: Hydrafera Blue 5. Secondary Dressing Applied ABD Pad 7. Secured with Tape 3 Layer Compression System - Right Lower Extremity Notes unna to anchor, drawtex, xtrasorb Electronic Signature(s) Signed:  03/19/2017 4:38:41 PM By: Alejandro MullingPinkerton, Debra Entered By: Alejandro MullingPinkerton, Debra on 03/19/2017 08:19:56 [name], [name] ([id number]) -------------------------------------------------------------------------------- Wound Assessment Details Patient Name: [name], [name] Date of Service: 03/19/2017 8:00 AM Medical Record Number: [id number] Patient Account Number: [id number] Date of Birth/Sex: 08-09-1946 [age] y.o. Male) Treating RN: Ashok CordiaPinkerton, [name] Primary Care Shanquita Ronning: Darreld McleanMILES, LINDA Other Clinician: Referring Gentri Guardado: Darreld McleanMILES, LINDA Treating Krina Mraz/Extender: Maxwell CaulOBSON, [name] G Weeks in Treatment: 61 Wound Status Wound Number: 2 Primary Venous Leg Ulcer Etiology: Wound Location: Right Malleolus - Medial Wound Open Wounding Event: Gradually Appeared Status: Date Acquired: 01/30/2016 Comorbid Cataracts, Chronic Obstructive Weeks Of Treatment: 59 History: Pulmonary Disease (COPD), Clustered Wound: Yes Osteoarthritis Photos Photo Uploaded By: Alejandro MullingPinkerton, Debra on 03/19/2017 09:40:27 Wound Measurements Length: (cm) 1.9 Width: (cm) 1.9 Depth: (cm) 0.2 Area: (cm) 2.835 Volume: (cm) 0.567 % Reduction in Area: 75.4% % Reduction in Volume: 50.9% Epithelialization: Small (1-33%) Tunneling: No Undermining: No Wound Description Full Thickness Without Exposed Foul Odor Afte Classification: Support Structures Slough/Fibrino Wound Margin: Distinct, outline attached Exudate Large Amount: Exudate Type: Serosanguineous Exudate Color: red, brown r Cleansing: No Yes Wound Bed Granulation Amount: Medium (34-66%) Granulation Quality: Red [name], [name] ([id number]) Necrotic Amount: Medium (34-66%) Necrotic Quality: Adherent Slough Periwound Skin Texture Texture Color No Abnormalities Noted: No No Abnormalities Noted: No Scarring: Yes Temperature / Pain Moisture Temperature: No Abnormality No Abnormalities Noted: No Tenderness on Palpation: Yes Maceration: Yes Wound  Preparation Ulcer Cleansing: Rinsed/Irrigated with Saline Topical Anesthetic Applied: Other: lidocaine 4%, Treatment Notes Wound #2 (Right, Medial Malleolus) 1. Cleansed with: Clean wound with Normal Saline Cleanse wound with antibacterial soap and water 2. Anesthetic Topical Lidocaine 4% cream to wound bed prior to debridement 3. Peri-wound Care: Barrier cream Moisturizing lotion 4. Dressing Applied: Hydrafera Blue 5. Secondary Dressing Applied ABD Pad 7. Secured with Tape 3 Layer Compression System - Right Lower Extremity Notes unna to anchor, drawtex, xtrasorb Electronic Signature(s) Signed: 03/19/2017 4:38:41 PM By: Alejandro MullingPinkerton, Debra Entered By: Alejandro MullingPinkerton, Debra on 03/19/2017 08:21:02 [name], [name] ([id number]) -------------------------------------------------------------------------------- Vitals Details Patient Name: [name], [name] Date of Service: 03/19/2017 8:00 AM Medical Record Number: [id number] Patient Account Number: [id number] Date of Birth/Sex: 08-09-1946 [age] y.o. Male) Treating RN: Phillis HaggisPinkerton, [name] Primary Care Naryiah Schley: Darreld McleanMILES, LINDA Other Clinician: Referring Alanie Syler: Darreld McleanMILES, LINDA Treating  Marquesa Rath/Extender: Maxwell CaulOBSON, [name] G Weeks in Treatment: 3661 Vital Signs Time Taken: 08:06 Temperature (F): 98.5 Height (in): 76 Pulse (bpm): 90 Weight (lbs): 238 Respiratory Rate (breaths/min): 18 Body Mass Index (BMI): 29 Blood Pressure (mmHg): 124/60 Reference Range: 80 - 120 mg / dl Electronic Signature(s) Signed: 03/19/2017 4:38:41 PM By: Alejandro MullingPinkerton, Debra Entered By: Alejandro MullingPinkerton, Debra on 03/19/2017 08:08:33

## 2017-03-22 DIAGNOSIS — I87331 Chronic venous hypertension (idiopathic) with ulcer and inflammation of right lower extremity: Secondary | ICD-10-CM | POA: Diagnosis not present

## 2017-03-24 NOTE — Progress Notes (Signed)
[name], Foday C. ([id number]) Visit Report for 03/22/2017 Arrival Information Details Patient Name: [name], [name] Date of Service: 03/22/2017 8:00 AM Medical Record Number: [id number] Patient Account Number: [id number] Date of Birth/Sex: Aug 06, 1946 [age] y.o. Male) Treating RN: Phillis HaggisPinkerton, [name] Primary Care Wade Asebedo: Darreld McleanMILES, LINDA Other Clinician: Referring Linsey Arteaga: Darreld McleanMILES, LINDA Treating Rocsi Hazelbaker/Extender: Rudene ReBritto, Errol Weeks in Treatment: 6361 Visit Information History Since Last Visit All ordered tests and consults were completed: No Patient Arrived: Ambulatory Added or deleted any medications: No Arrival Time: 08:05 Any new allergies or adverse reactions: No Accompanied By: self Had a fall or experienced change in No Transfer Assistance: None activities of daily living that may affect Patient Identification Verified: Yes risk of falls: Secondary Verification Process Yes Signs or symptoms of abuse/neglect since last No Completed: visito Patient Requires Transmission-Based No Hospitalized since last visit: No Precautions: Has Dressing in Place as Prescribed: Yes Patient Has Alerts: No Has Compression in Place as Prescribed: Yes Pain Present Now: No Electronic Signature(s) Signed: 03/22/2017 2:59:16 PM By: Alejandro MullingPinkerton, Debra Entered By: Alejandro MullingPinkerton, Debra on 03/22/2017 08:11:52 [name], [name] ([id number]) -------------------------------------------------------------------------------- Encounter Discharge Information Details Patient Name: [name], [name] Date of Service: 03/22/2017 8:00 AM Medical Record Number: [id number] Patient Account Number: [id number] Date of Birth/Sex: Aug 06, 1946 [age] y.o. Male) Treating RN: Phillis HaggisPinkerton, [name] Primary Care Karisma Meiser: Darreld McleanMILES, LINDA Other Clinician: Referring Janitza Revuelta: Darreld McleanMILES, LINDA Treating Yosgar Demirjian/Extender: Rudene ReBritto, Errol Weeks in Treatment: 4361 Encounter Discharge Information Items Discharge Pain Level: 0 Discharge Condition:  Stable Ambulatory Status: Ambulatory Discharge Destination: Home Private Transportation: Auto Accompanied By: self Schedule Follow-up Appointment: Yes Medication Reconciliation completed and No provided to Patient/Care Sabrea Sankey: Clinical Summary of Care: Electronic Signature(s) Signed: 03/22/2017 2:59:16 PM By: Alejandro MullingPinkerton, Debra Entered By: Alejandro MullingPinkerton, Debra on 03/22/2017 08:14:34 [name], [place] C. ([id number]) -------------------------------------------------------------------------------- Patient/Caregiver Education Details Patient Name: [name], [name] Date of Service: 03/22/2017 8:00 AM Medical Record Number: [id number] Patient Account Number: [id number] Date of Birth/Gender: Aug 06, 1946 [age] y.o. Male) Treating RN: Phillis HaggisPinkerton, [name] Primary Care Physician: Darreld McleanMILES, LINDA Other Clinician: Referring Physician: Darreld McleanMILES, LINDA Treating Physician/Extender: Rudene ReBritto, Errol Weeks in Treatment: 3461 Education Assessment Education Provided To: Patient Education Topics Provided Wound/Skin Impairment: Handouts: Other: change dressing as ordered Methods: Demonstration, Explain/Verbal Responses: State content correctly Electronic Signature(s) Signed: 03/22/2017 2:59:16 PM By: Alejandro MullingPinkerton, Debra Entered By: Alejandro MullingPinkerton, Debra on 03/22/2017 08:14:20 [name], [name] ([id number]) -------------------------------------------------------------------------------- Wound Assessment Details Patient Name: [name], [name] Date of Service: 03/22/2017 8:00 AM Medical Record Number: [id number] Patient Account Number: [id number] Date of Birth/Sex: Aug 06, 1946 [age] y.o. Male) Treating RN: Ashok CordiaPinkerton, [name] Primary Care Gelene Recktenwald: Darreld McleanMILES, LINDA Other Clinician: Referring Reanne Nellums: Darreld McleanMILES, LINDA Treating Markelle Najarian/Extender: Rudene ReBritto, Errol Weeks in Treatment: 61 Wound Status Wound Number: 1 Primary Venous Leg Ulcer Etiology: Wound Location: Right Malleolus - Lateral Wound Open Wounding Event: Gradually  Appeared Status: Date Acquired: 08/11/2015 Comorbid Cataracts, Chronic Obstructive Weeks Of Treatment: 61 History: Pulmonary Disease (COPD), Clustered Wound: No Osteoarthritis Photos Photo Uploaded By: Alejandro MullingPinkerton, Debra on 03/22/2017 13:21:01 Wound Measurements Length: (cm) 5 Width: (cm) 4.8 Depth: (cm) 0.2 Area: (cm) 18.85 Volume: (cm) 3.77 % Reduction in Area: -9.1% % Reduction in Volume: 27.3% Epithelialization: Small (1-33%) Tunneling: No Undermining: No Wound Description Full Thickness Without Exposed Foul Odor After Classification: Support Structures Slough/Fibrino Wound Margin: Distinct, outline attached Exudate Large Amount: Exudate Type: Serous Exudate Color: amber Cleansing: No Yes Wound Bed Granulation Amount: Small (1-33%) Granulation Quality: Red Pennypacker, Amrit C. ([id number]) Necrotic Amount: Large (67-100%) Necrotic Quality: Adherent Slough Periwound Skin Texture Texture Color No Abnormalities Noted: No No Abnormalities  Noted: No Scarring: Yes Temperature / Pain Moisture Temperature: No Abnormality No Abnormalities Noted: No Tenderness on Palpation: Yes Maceration: Yes Wound Preparation Ulcer Cleansing: Rinsed/Irrigated with Saline Topical Anesthetic Applied: None, Other: lidocaine 4%, Treatment Notes Wound #1 (Right, Lateral Malleolus) 1. Cleansed with: Clean wound with Normal Saline Cleanse wound with antibacterial soap and water 3. Peri-wound Care: Barrier cream Moisturizing lotion 4. Dressing Applied: Hydrafera Blue 5. Secondary Dressing Applied ABD Pad 7. Secured with Tape 3 Layer Compression System - Right Lower Extremity Notes unna to anchor, xtrasorb, drawtex Electronic Signature(s) Signed: 03/22/2017 2:59:16 PM By: Alejandro MullingPinkerton, Debra Entered By: Alejandro MullingPinkerton, Debra on 03/22/2017 08:12:41 [name], [name] ([id number]) -------------------------------------------------------------------------------- Wound Assessment  Details Patient Name: [name], [name] Date of Service: 03/22/2017 8:00 AM Medical Record Number: [id number] Patient Account Number: [id number] Date of Birth/Sex: September 27, 1945 [age] y.o. Male) Treating RN: Ashok CordiaPinkerton, [name] Primary Care Audrey Eller: Darreld McleanMILES, LINDA Other Clinician: Referring Cierah Crader: Darreld McleanMILES, LINDA Treating Leston Schueller/Extender: Rudene ReBritto, Errol Weeks in Treatment: 61 Wound Status Wound Number: 2 Primary Venous Leg Ulcer Etiology: Wound Location: Right Malleolus - Medial Wound Open Wounding Event: Gradually Appeared Status: Date Acquired: 01/30/2016 Comorbid Cataracts, Chronic Obstructive Weeks Of Treatment: 59 History: Pulmonary Disease (COPD), Clustered Wound: Yes Osteoarthritis Photos Photo Uploaded By: Alejandro MullingPinkerton, Debra on 03/22/2017 13:21:01 Wound Measurements Length: (cm) 1.9 Width: (cm) 1.9 Depth: (cm) 0.2 Area: (cm) 2.835 Volume: (cm) 0.567 % Reduction in Area: 75.4% % Reduction in Volume: 50.9% Epithelialization: Small (1-33%) Tunneling: No Undermining: No Wound Description Full Thickness Without Exposed Classification: Support Structures Wound Margin: Distinct, outline attached Exudate Large Amount: Exudate Type: Serosanguineous Exudate Color: red, brown Foul Odor After Cleansing: No Slough/Fibrino Yes Wound Bed Granulation Amount: Medium (34-66%) Granulation Quality: Red [place], Icker C. ([id number]) Necrotic Amount: Medium (34-66%) Necrotic Quality: Adherent Slough Periwound Skin Texture Texture Color No Abnormalities Noted: No No Abnormalities Noted: No Scarring: Yes Temperature / Pain Moisture Temperature: No Abnormality No Abnormalities Noted: No Tenderness on Palpation: Yes Maceration: Yes Wound Preparation Ulcer Cleansing: Rinsed/Irrigated with Saline Topical Anesthetic Applied: None, Other: lidocaine 4%, Treatment Notes Wound #2 (Right, Medial Malleolus) 1. Cleansed with: Clean wound with Normal Saline Cleanse wound with  antibacterial soap and water 3. Peri-wound Care: Barrier cream Moisturizing lotion 4. Dressing Applied: Hydrafera Blue 5. Secondary Dressing Applied ABD Pad 7. Secured with Tape 3 Layer Compression System - Right Lower Extremity Notes unna to anchor, xtrasorb, drawtex Electronic Signature(s) Signed: 03/22/2017 2:59:16 PM By: Alejandro MullingPinkerton, Debra Entered By: Alejandro MullingPinkerton, Debra on 03/22/2017 08:12:57

## 2017-03-26 ENCOUNTER — Encounter: Payer: Medicare HMO | Admitting: Internal Medicine

## 2017-03-26 DIAGNOSIS — I87331 Chronic venous hypertension (idiopathic) with ulcer and inflammation of right lower extremity: Secondary | ICD-10-CM | POA: Diagnosis not present

## 2017-03-27 NOTE — Progress Notes (Signed)
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 3.927cm^3 volume. Character of Wound/Ulcer Post Debridement requires further debridement. Severity of Tissue Post Debridement is: Fat layer exposed. Post procedure Diagnosis Wound #1: Same as Pre-Procedure WILLAIM, MODE. ([id number]) Plan Wound Cleansing: Wound #1 Right,Lateral Malleolus: Cleanse wound with mild soap and water May shower with protection. No tub bath. Wound #2 Right,Medial  Malleolus: Cleanse wound with mild soap and water May shower with protection. No tub bath. Anesthetic: Wound #1 Right,Lateral Malleolus: Topical Lidocaine 4% cream applied to wound bed prior to debridement Wound #2 Right,Medial Malleolus: Topical Lidocaine 4% cream applied to wound bed prior to debridement Skin Barriers/Peri-Wound Care: Wound #1 Right,Lateral Malleolus: Barrier cream - zinc oxide Triamcinolone Acetonide Ointment Primary Wound Dressing: Wound #1 Right,Lateral Malleolus: Hydrafera Blue Wound #  2 Right,Medial Malleolus: Hydrafera Blue Secondary Dressing: Wound #1 Right,Lateral Malleolus: ABD pad XtraSorb Drawtex Wound #2 Right,Medial Malleolus: ABD pad Drawtex Drawtex Dressing Change Frequency: Wound #1 Right,Lateral Malleolus: Change dressing every week Other: - nurse visit Friday Wound #2 Right,Medial Malleolus: Change dressing every week Other: - nurse visit Friday Follow-up Appointments: Wound #1 Right,Lateral Malleolus: Return Appointment in 1 week. Other: - nurse visit Friday Wound #2 Right,Medial Malleolus: Return Appointment in 1 week. Other: - nurse visit Friday Edema Control: SHAWNTA, ZIMBELMAN ([id number]) Wound #1 Right,Lateral Malleolus: 3 Layer Compression System - Right Lower Extremity - UNNA TO ANCHOR Elevate legs to the level of the heart and pump ankles as often as possible Wound #2 Right,Medial Malleolus: 3 Layer Compression System - Right Lower Extremity - UNNA TO ANCHOR Elevate legs to the level of the heart and pump ankles as often as possible Additional Orders / Instructions: Wound #1 Right,Lateral Malleolus: Increase protein intake. OK to return to work with the following restrictions: Activity as tolerated Other: - Run Apligraph through insurance Wound #2 Right,Medial Malleolus: Increase protein intake. OK to return to work with the following restrictions: Activity as tolerated #1 I see no good option but to continue  the current treatment plan which is weekly debridements and hydrofera. Due to problems are large drainage and tightly adherent nonviable surface. We have had multiple courses of different dressings and he has no option for advanced treatment from a financial point of view, even if we could get the surface to the point where it might help Electronic Signature(s) Signed: 03/26/2017 6:05:23 PM By: [name] MD Entered By: [name] on 03/26/2017 08:37:58 [name] ([id number]) -------------------------------------------------------------------------------- SuperBill Details [name] Date of Service: 03/26/2017 Patient Name: C. Patient Account Number: [id number] Medical Record Treating RN: [name] [id number] Number: Other Clinician: Date of Birth/Sex: [birth year] (71 y.o. 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, initial T85.613A encounter Disruption of external operation (surgical) wound, not elsewhere classified, subsequent T81.31XD encounter L97.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 of skin Facility Procedures CPT4: Description Modifier Quantity Code 08657846 11042 - DEB SUBQ TISSUE 20 SQ CM/< 1 ICD-10 Description Diagnosis I87.331 Chronic venous hypertension (idiopathic) with ulcer and inflammation of right lower extremity L97.212 Non-pressure chronic ulcer  of right calf with fat layer exposed CPT4: 96295284 11045 - DEB SUBQ TISS EA ADDL 20CM 1 ICD-10  Description Diagnosis L97.212 Non-pressure chronic ulcer of right calf with fat layer exposed Physician Procedures CPT4: Description Modifier Quantity Code 1324401 11042 - WC PHYS SUBQ TISS 20 SQ CM 1 ICD-10 Description Diagnosis I87.331 DHAVAL, WOO ([id number]) Electronic Signature(s) Signed: 03/26/2017 6:05:23 PM By: [name] MD Entered By: [name] on 03/26/2017 08:38:45

## 2017-03-27 NOTE — Progress Notes (Signed)
[name], [name] ([id number]) Visit Report for 03/26/2017 Arrival Information Details Patient Name: [name], [name] Date of Service: 03/26/2017 8:00 AM Medical Record Number: [id number] Patient Account Number: [id number] Date of Birth/Sex: 1946/07/30 26(70 y.o. Male) Treating RN: Phillis HaggisPinkerton, [name] Primary Care Lihanna Biever: Darreld McleanMILES, LINDA Other Clinician: Referring Sidnie Swalley: Darreld McleanMILES, LINDA Treating Gail Creekmore/Extender: Altamese CarolinaOBSON, [name] G Weeks in Treatment: 62 Visit Information History Since Last Visit All ordered tests and consults were completed: No Patient Arrived: Ambulatory Added or deleted any medications: No Arrival Time: 08:03 Any new allergies or adverse reactions: No Accompanied By: self Had a fall or experienced change in No Transfer Assistance: None activities of daily living that may affect Patient Identification Verified: Yes risk of falls: Secondary Verification Process Yes Signs or symptoms of abuse/neglect since last No Completed: visito Patient Requires Transmission-Based No Hospitalized since last visit: No Precautions: Has Dressing in Place as Prescribed: Yes Patient Has Alerts: No Has Compression in Place as Prescribed: Yes Pain Present Now: No Electronic Signature(s) Signed: 03/26/2017 5:31:33 PM By: Alejandro MullingPinkerton, Debra Entered By: Alejandro MullingPinkerton, Debra on 03/26/2017 08:04:12 [name], [name] ([id number]) -------------------------------------------------------------------------------- Encounter Discharge Information Details Patient Name: [name], [name] Date of Service: 03/26/2017 8:00 AM Medical Record Number: [id number] Patient Account Number: [id number] Date of Birth/Sex: 1946/07/30 [age] y.o. Male) Treating RN: Phillis HaggisPinkerton, [name] Primary Care Kwane Rohl: Darreld McleanMILES, LINDA Other Clinician: Referring Mihir Flanigan: Darreld McleanMILES, LINDA Treating Jackelyne Sayer/Extender: Maxwell CaulOBSON, [name] G Weeks in Treatment: 2562 Encounter Discharge Information Items Discharge Pain Level: 0 Discharge  Condition: Stable Ambulatory Status: Ambulatory Discharge Destination: Home Transportation: Private Auto Accompanied By: self Schedule Follow-up Appointment: Yes Medication Reconciliation completed No and provided to Patient/Care Curly Mackowski: Patient Clinical Summary of Care: Declined Electronic Signature(s) Signed: 03/26/2017 8:42:11 AM By: Gwenlyn PerkingMoore, Shelia Entered By: Gwenlyn PerkingMoore, Shelia on 03/26/2017 08:42:11 [name], Rice C. ([id number]) -------------------------------------------------------------------------------- Lower Extremity Assessment Details Patient Name: [name], [name] Date of Service: 03/26/2017 8:00 AM Medical Record Number: [id number] Patient Account Number: [id number] Date of Birth/Sex: 1946/07/30 63(70 y.o. Male) Treating RN: Phillis HaggisPinkerton, [name] Primary Care Marijah Larranaga: Darreld McleanMILES, LINDA Other Clinician: Referring Randon Somera: Darreld McleanMILES, LINDA Treating Roniqua Kintz/Extender: Maxwell CaulOBSON, [name] G Weeks in Treatment: 62 Vascular Assessment Pulses: Dorsalis Pedis Palpable: [Right:Yes] Posterior Tibial Extremity colors, hair growth, and conditions: Extremity Color: [Right:Normal] Temperature of Extremity: [Right:Warm] Capillary Refill: [Right:< 3 seconds] Toe Nail Assessment Left: Right: Thick: Yes Discolored: Yes Deformed: No Improper Length and Hygiene: Yes Electronic Signature(s) Signed: 03/26/2017 5:31:33 PM By: Alejandro MullingPinkerton, Debra Entered By: Alejandro MullingPinkerton, Debra on 03/26/2017 08:17:12 [name], [name] ([id number]) -------------------------------------------------------------------------------- Multi Wound Chart Details Patient Name: [name], [name] Date of Service: 03/26/2017 8:00 AM Medical Record Number: [id number] Patient Account Number: [id number] Date of Birth/Sex: 1946/07/30 [age] y.o. Male) Treating RN: Ashok CordiaPinkerton, [name] Primary Care Thi Klich: Darreld McleanMILES, LINDA Other Clinician: Referring Mohsin Crum: Darreld McleanMILES, LINDA Treating Zacharie Portner/Extender: Maxwell CaulOBSON, [name] G Weeks in Treatment:  62 Vital Signs Height(in): 76 Pulse(bpm): 75 Weight(lbs): 238 Blood Pressure 136/63 (mmHg): Body Mass Index(BMI): 29 Temperature(F): 98.0 Respiratory Rate 18 (breaths/min): Photos: [1:No Photos] [2:No Photos] [N/A:N/A] Wound Location: [1:Right, Lateral Malleolus] [2:Right Malleolus - Medial] [N/A:N/A] Wounding Event: [1:Gradually Appeared] [2:Gradually Appeared] [N/A:N/A] Primary Etiology: [1:Venous Leg Ulcer] [2:Venous Leg Ulcer] [N/A:N/A] Comorbid History: [1:Cataracts, Chronic Obstructive Pulmonary Disease (COPD), Osteoarthritis] [2:Cataracts, Chronic Obstructive Pulmonary Disease (COPD), Osteoarthritis] [N/A:N/A] Date Acquired: [1:08/11/2015] [2:01/30/2016] [N/A:N/A] Weeks of Treatment: [1:62] [2:60] [N/A:N/A] Wound Status: [1:Open] [2:Open] [N/A:N/A] Clustered Wound: [1:No] [2:Yes] [N/A:N/A] Measurements L x W x D 5x5x0.2 [2:2.1x0.9x0.2] [N/A:N/A] (cm) Area (cm) : [1:19.635] [2:1.484] [N/A:N/A] Volume (cm) : [1:3.927] [2:0.297] [N/A:N/A] % Reduction in Area: [1:-13.60%] [2:87.10%] [  N/A:N/A] % Reduction in Volume: 24.20% [2:74.30%] [N/A:N/A] Classification: [1:Full Thickness Without Exposed Support Structures] [2:Full Thickness Without Exposed Support Structures] [N/A:N/A] Exudate Amount: [1:Large] [2:Large] [N/A:N/A] Exudate Type: [1:Serosanguineous] [2:Serosanguineous] [N/A:N/A] Exudate Color: [1:red, brown] [2:red, brown] [N/A:N/A] Wound Margin: [1:Distinct, outline attached] [2:Distinct, outline attached] [N/A:N/A] Granulation Amount: [1:Medium (34-66%)] [2:Medium (34-66%)] [N/A:N/A] Granulation Quality: [1:Red] [2:Red] [N/A:N/A] Necrotic Amount: [1:Medium (34-66%)] [2:Medium (34-66%)] [N/A:N/A] Epithelialization: [1:Small (1-33%)] [2:Small (1-33%)] [N/A:N/A] Periwound Skin Texture: Scarring: Yes [2:Scarring: Yes] [N/A:N/A] Periwound Skin Maceration: Yes Maceration: Yes N/A Moisture: Periwound Skin Color: No Abnormalities Noted No Abnormalities Noted  N/A Temperature: No Abnormality No Abnormality N/A Tenderness on Yes Yes N/A Palpation: Wound Preparation: Ulcer Cleansing: Ulcer Cleansing: N/A Rinsed/Irrigated with Rinsed/Irrigated with Saline Saline Topical Anesthetic Topical Anesthetic Applied: None, Other: Applied: None, Other: lidocaine 4% lidocaine 4% Treatment Notes Electronic Signature(s) Signed: 03/26/2017 5:31:33 PM By: Alejandro Mulling Entered By: Alejandro Mulling on 03/26/2017 08:19:26 [name] Stack ([id number]) -------------------------------------------------------------------------------- Multi-Disciplinary Care Plan Details Patient Name: [name] Stack Date of Service: 03/26/2017 8:00 AM Medical Record Number: [id number] Patient Account Number: [id number] Date of Birth/Sex: 1945-10-20 (71 y.o. Male) Treating RN: [name] Primary Care Shavontae Gibeault: [name] Other Clinician: Referring Brooklee Michelin: [name] Treating Leo Weyandt/Extender: [name] Weeks in Treatment: 62 Active Inactive ` Venous Leg Ulcer Nursing Diagnoses: Knowledge deficit related to disease process and management Goals: Patient will maintain optimal edema control Date Initiated: 04/03/2016 Target Resolution Date: 11/24/2016 Goal Status: Active Interventions: Compression as ordered Treatment Activities: Therapeutic compression applied : 04/03/2016 Notes: ` Wound/Skin Impairment Nursing Diagnoses: Impaired tissue integrity Goals: Ulcer/skin breakdown will heal within 14 weeks Date Initiated: 01/17/2016 Target Resolution Date: 11/24/2016 Goal Status: Active Interventions: Assess ulceration(s) every visit Notes: Electronic Signature(s) Signed: 03/26/2017 5:31:33 PM By: Aurelio Jew, [name] ([id number]) Entered By: Alejandro Mulling on 03/26/2017 08:19:01 [name] Stack ([id number]) -------------------------------------------------------------------------------- Pain Assessment Details Patient  Name: [name] Stack Date of Service: 03/26/2017 8:00 AM Medical Record Number: [id number] Patient Account Number: [id number] Date of Birth/Sex: [birth year] (71 y.o. Male) Treating RN: [name] Primary Care Carlosdaniel Grob: [name] Other Clinician: Referring Cydni Reddoch: [name] Treating Donald Jacque/Extender: [name] Weeks in Treatment: 62 Active Problems Location of Pain Severity and Description of Pain Patient Has Paino No Site Locations With Dressing Change: No Pain Management and Medication Current Pain Management: Electronic Signature(s) Signed: 03/26/2017 5:31:33 PM By: Alejandro Mulling Entered By: Alejandro Mulling on 03/26/2017 08:04:19 [name] Stack ([id number]) -------------------------------------------------------------------------------- Patient/Caregiver Education Details [name] Date of Service: 03/26/2017 8:00 AM Patient Name: C. Patient Account Number: [id number] Medical Record Treating RN: [name] [id number] Number: Other Clinician: Date of Birth/Gender: 01/20/1946 (71 y.o. Male) Treating [name], [name] Primary Care Physician: [name] Physician/Extender: G Referring Physician: [name] in Treatment: 10 Education Assessment Education Provided To: Patient Education Topics Provided Wound/Skin Impairment: Handouts: Other: change dressing as ordered Methods: Demonstration, Explain/Verbal Responses: State content correctly Electronic Signature(s) Signed: 03/26/2017 5:31:33 PM By: Alejandro Mulling Entered By: Alejandro Mulling on 03/26/2017 08:20:06 [name] Stack ([id number]) -------------------------------------------------------------------------------- Wound Assessment Details Patient Name: [name] Stack Date of Service: 03/26/2017 8:00 AM Medical Record Number: [id number] Patient Account Number: [id number] Date of Birth/Sex: 06/26/1946 (71 y.o. Male) Treating RN: [name] Primary  Care Kentrell Guettler: [name] Other Clinician: Referring Myer Bohlman: [name] Treating Nyjah Schwake/Extender: [name] Weeks in Treatment: 62 Wound Status Wound Number: 1 Primary Venous Leg Ulcer Etiology: Wound Location: Right, Lateral Malleolus Wound Open Wounding Event: Gradually Appeared Status: Date Acquired: 08/11/2015 Comorbid Cataracts, Chronic  Obstructive Weeks Of Treatment: 62 History: Pulmonary Disease (COPD), Clustered Wound: No Osteoarthritis Photos Photo Uploaded By: Alejandro MullingPinkerton, Debra on 03/26/2017 09:03:29 Wound Measurements Length: (cm) 5 Width: (cm) 5 Depth: (cm) 0.2 Area: (cm) 19.635 Volume: (cm) 3.927 % Reduction in Area: -13.6% % Reduction in Volume: 24.2% Epithelialization: Small (1-33%) Tunneling: No Undermining: No Wound Description Full Thickness Without Exposed Foul Odor Afte Classification: Support Structures Slough/Fibrino Wound Margin: Distinct, outline attached Exudate Large Amount: Exudate Type: Serosanguineous Exudate Color: red, brown r Cleansing: No Yes Wound Bed Granulation Amount: Medium (34-66%) Granulation Quality: Red Pollok, Verdon C. ([id number]) Necrotic Amount: Medium (34-66%) Necrotic Quality: Adherent Slough Periwound Skin Texture Texture Color No Abnormalities Noted: No No Abnormalities Noted: No Scarring: Yes Temperature / Pain Moisture Temperature: No Abnormality No Abnormalities Noted: No Tenderness on Palpation: Yes Maceration: Yes Wound Preparation Ulcer Cleansing: Rinsed/Irrigated with Saline Topical Anesthetic Applied: None, Other: lidocaine 4%, Treatment Notes Wound #1 (Right, Lateral Malleolus) 1. Cleansed with: Clean wound with Normal Saline Cleanse wound with antibacterial soap and water 2. Anesthetic Topical Lidocaine 4% cream to wound bed prior to debridement 3. Peri-wound Care: Barrier cream Moisturizing lotion 4. Dressing Applied: Hydrafera Blue 5. Secondary Dressing  Applied ABD Pad 7. Secured with Tape 3 Layer Compression System - Right Lower Extremity Notes unna to anchor, xtrasorb, drawtex Electronic Signature(s) Signed: 03/26/2017 5:31:33 PM By: Alejandro MullingPinkerton, Debra Entered By: Alejandro MullingPinkerton, Debra on 03/26/2017 08:16:49 [name], Krishon C. ([id number]) -------------------------------------------------------------------------------- Wound Assessment Details Patient Name: [name], Jakhai C. Date of Service: 03/26/2017 8:00 AM Medical Record Number: [id number] Patient Account Number: [id number] Date of Birth/Sex: 12/30/1945 [age] y.o. Male) Treating RN: Ashok CordiaPinkerton, [name] Primary Care Elvina Bosch: Darreld McleanMILES, LINDA Other Clinician: Referring Kollyn Lingafelter: Darreld McleanMILES, LINDA Treating [name] [name]/Extender: Maxwell CaulOBSON, [name] G Weeks in Treatment: 62 Wound Status Wound Number: 2 Primary Venous Leg Ulcer Etiology: Wound Location: Right Malleolus - Medial Wound Open Wounding Event: Gradually Appeared Status: Date Acquired: 01/30/2016 Comorbid Cataracts, Chronic Obstructive Weeks Of Treatment: 60 History: Pulmonary Disease (COPD), Clustered Wound: Yes Osteoarthritis Photos Photo Uploaded By: Alejandro MullingPinkerton, Debra on 03/26/2017 09:03:30 Wound Measurements Length: (cm) 2.1 Width: (cm) 0.9 Depth: (cm) 0.2 Area: (cm) 1.484 Volume: (cm) 0.297 % Reduction in Area: 87.1% % Reduction in Volume: 74.3% Epithelialization: Small (1-33%) Tunneling: No Undermining: No Wound Description Full Thickness Without Exposed Foul Odor Afte Classification: Support Structures Slough/Fibrino Wound Margin: Distinct, outline attached Exudate Large Amount: Exudate Type: Serosanguineous Exudate Color: red, brown r Cleansing: No Yes Wound Bed Granulation Amount: Medium (34-66%) Granulation Quality: Red Luther, Jahmani C. ([id number]) Necrotic Amount: Medium (34-66%) Necrotic Quality: Adherent Slough Periwound Skin Texture Texture Color No Abnormalities Noted: No No Abnormalities  Noted: No Scarring: Yes Temperature / Pain Moisture Temperature: No Abnormality No Abnormalities Noted: No Tenderness on Palpation: Yes Maceration: Yes Wound Preparation Ulcer Cleansing: Rinsed/Irrigated with Saline Topical Anesthetic Applied: None, Other: lidocaine 4%, Treatment Notes Wound #2 (Right, Medial Malleolus) 1. Cleansed with: Clean wound with Normal Saline Cleanse wound with antibacterial soap and water 2. Anesthetic Topical Lidocaine 4% cream to wound bed prior to debridement 3. Peri-wound Care: Barrier cream Moisturizing lotion 4. Dressing Applied: Hydrafera Blue 5. Secondary Dressing Applied ABD Pad 7. Secured with Tape 3 Layer Compression System - Right Lower Extremity Notes unna to anchor, xtrasorb, drawtex Electronic Signature(s) Signed: 03/26/2017 5:31:33 PM By: Alejandro MullingPinkerton, Debra Entered By: Alejandro MullingPinkerton, Debra on 03/26/2017 08:14:16 [name], Tomer C. ([id number]) -------------------------------------------------------------------------------- Vitals Details Patient Name: [name], Jacorian C. Date of Service: 03/26/2017 8:00 AM Medical Record Number: [id number] Patient Account Number: [id number] Date of Birth/Sex:  Jun 28, 1946 (71 y.o. Male) Treating RN: Ashok Cordia, [name] Primary Care Delfin Squillace: MILES, LINDA Other Clinician: Referring Uriyah Raska: [name] Treating Joeziah Voit/Extender: Altamese [place] in Treatment: 62 Vital Signs Time Taken: 08:04 Temperature (F): 98.0 Height (in): 76 Pulse (bpm): 75 Weight (lbs): 238 Respiratory Rate (breaths/min): 18 Body Mass Index (BMI): 29 Blood Pressure (mmHg): 136/63 Reference Range: 80 - 120 mg / dl Electronic Signature(s) Signed: 03/26/2017 5:31:33 PM By: Alejandro Mulling Entered By: Alejandro Mulling on 03/26/2017 08:06:03

## 2017-03-29 ENCOUNTER — Encounter: Payer: Medicare HMO | Attending: Surgery

## 2017-03-29 DIAGNOSIS — Z7901 Long term (current) use of anticoagulants: Secondary | ICD-10-CM | POA: Diagnosis not present

## 2017-03-29 DIAGNOSIS — Z86718 Personal history of other venous thrombosis and embolism: Secondary | ICD-10-CM | POA: Insufficient documentation

## 2017-03-29 DIAGNOSIS — Y832 Surgical operation with anastomosis, bypass or graft as the cause of abnormal reaction of the patient, or of later complication, without mention of misadventure at the time of the procedure: Secondary | ICD-10-CM | POA: Diagnosis not present

## 2017-03-29 DIAGNOSIS — L97212 Non-pressure chronic ulcer of right calf with fat layer exposed: Secondary | ICD-10-CM | POA: Insufficient documentation

## 2017-03-29 DIAGNOSIS — T8131XD Disruption of external operation (surgical) wound, not elsewhere classified, subsequent encounter: Secondary | ICD-10-CM | POA: Insufficient documentation

## 2017-03-29 DIAGNOSIS — L97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 of skin: Secondary | ICD-10-CM | POA: Insufficient documentation

## 2017-03-29 DIAGNOSIS — T85613A 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, initial encounter: Secondary | ICD-10-CM | POA: Diagnosis not present

## 2017-03-29 DIAGNOSIS — I87331 Chronic venous hypertension (idiopathic) with ulcer and inflammation of right lower extremity: Secondary | ICD-10-CM | POA: Diagnosis present

## 2017-03-31 NOTE — Progress Notes (Signed)
[name], Mills C. ([id number]) Visit Report for 03/29/2017 Arrival Information Details Patient Name: [name], [name] Date of Service: 03/29/2017 8:00 AM Medical Record Number: [id number] Patient Account Number: [id number] Date of Birth/Sex: 1946/04/04 [age] y.o. Male) Treating RN: Phillis HaggisPinkerton, [name] Primary Care Danton Palmateer: Darreld McleanMILES, LINDA Other Clinician: Referring Elif Yonts: Darreld McleanMILES, LINDA Treating Aleisa Howk/Extender: Rudene ReBritto, Errol Weeks in Treatment: 4462 Visit Information History Since Last Visit All ordered tests and consults were completed: No Patient Arrived: Ambulatory Added or deleted any medications: No Arrival Time: 08:01 Any new allergies or adverse reactions: No Accompanied By: self Had a fall or experienced change in No Transfer Assistance: None activities of daily living that may affect Patient Identification Verified: Yes risk of falls: Secondary Verification Process Yes Signs or symptoms of abuse/neglect since last No Completed: visito Patient Requires Transmission-Based No Hospitalized since last visit: No Precautions: Has Dressing in Place as Prescribed: Yes Patient Has Alerts: No Has Compression in Place as Prescribed: Yes Pain Present Now: No Electronic Signature(s) Signed: 03/29/2017 4:34:22 PM By: Alejandro MullingPinkerton, Debra Entered By: Alejandro MullingPinkerton, Debra on 03/29/2017 08:06:49 [name], [name] ([id number]) -------------------------------------------------------------------------------- Encounter Discharge Information Details Patient Name: [name], [name] Date of Service: 03/29/2017 8:00 AM Medical Record Number: [id number] Patient Account Number: [id number] Date of Birth/Sex: 1946/04/04 [age] y.o. Male) Treating RN: Phillis HaggisPinkerton, [name] Primary Care Donjuan Robison: Darreld McleanMILES, LINDA Other Clinician: Referring Davanee Klinkner: Darreld McleanMILES, LINDA Treating Liany Mumpower/Extender: Rudene ReBritto, Errol Weeks in Treatment: 5562 Encounter Discharge Information Items Discharge Pain Level: 0 Discharge Condition:  Stable Ambulatory Status: Ambulatory Discharge Destination: Home Private Transportation: Auto Accompanied By: self Schedule Follow-up Appointment: Yes Medication Reconciliation completed and No provided to Patient/Care Tinya Cadogan: Clinical Summary of Care: Electronic Signature(s) Signed: 03/29/2017 4:34:22 PM By: Alejandro MullingPinkerton, Debra Entered By: Alejandro MullingPinkerton, Debra on 03/29/2017 08:09:20 [name], [name] ([id number]) -------------------------------------------------------------------------------- Patient/Caregiver Education Details Patient Name: [name], [name] Date of Service: 03/29/2017 8:00 AM Medical Record Number: [id number] Patient Account Number: [id number] Date of Birth/Gender: 1946/04/04 [age] y.o. Male) Treating RN: Phillis HaggisPinkerton, [name] Primary Care Physician: Darreld McleanMILES, LINDA Other Clinician: Referring Physician: Darreld McleanMILES, LINDA Treating Physician/Extender: Rudene ReBritto, Errol Weeks in Treatment: 6262 Education Assessment Education Provided To: Patient Education Topics Provided Wound/Skin Impairment: Handouts: Other: change dressing as ordered Methods: Demonstration, Explain/Verbal Responses: State content correctly Electronic Signature(s) Signed: 03/29/2017 4:34:22 PM By: Alejandro MullingPinkerton, Debra Entered By: Alejandro MullingPinkerton, Debra on 03/29/2017 08:09:06 [name], [name] ([id number]) -------------------------------------------------------------------------------- Wound Assessment Details Patient Name: [name], [name] Date of Service: 03/29/2017 8:00 AM Medical Record Number: [id number] Patient Account Number: [id number] Date of Birth/Sex: 1946/04/04 [age] y.o. Male) Treating RN: Ashok CordiaPinkerton, [name] Primary Care Mack Alvidrez: Darreld McleanMILES, LINDA Other Clinician: Referring Nashiya Disbrow: Darreld McleanMILES, LINDA Treating Mileah Hemmer/Extender: Rudene ReBritto, Errol Weeks in Treatment: 62 Wound Status Wound Number: 1 Primary Venous Leg Ulcer Etiology: Wound Location: Right Malleolus - Lateral Wound Open Wounding Event: Gradually  Appeared Status: Date Acquired: 08/11/2015 Comorbid Cataracts, Chronic Obstructive Weeks Of Treatment: 62 History: Pulmonary Disease (COPD), Clustered Wound: No Osteoarthritis Photos Photo Uploaded By: Alejandro MullingPinkerton, Debra on 03/29/2017 11:38:12 Wound Measurements Length: (cm) 5 Width: (cm) 5 Depth: (cm) 0.2 Area: (cm) 19.635 Volume: (cm) 3.927 % Reduction in Area: -13.6% % Reduction in Volume: 24.2% Epithelialization: Small (1-33%) Tunneling: No Undermining: No Wound Description Full Thickness Without Exposed Classification: Support Structures Wound Margin: Distinct, outline attached Exudate Large Amount: Exudate Type: Serosanguineous Exudate Color: red, brown Foul Odor After Cleansing: No Slough/Fibrino Yes Wound Bed Granulation Amount: Medium (34-66%) Granulation Quality: Red Sime, Brennon C. ([id number]) Necrotic Amount: Medium (34-66%) Necrotic Quality: Adherent Slough Periwound Skin Texture Texture Color No Abnormalities Noted: No No  Abnormalities Noted: No Scarring: Yes Temperature / Pain Moisture Temperature: No Abnormality No Abnormalities Noted: No Tenderness on Palpation: Yes Maceration: Yes Wound Preparation Ulcer Cleansing: Rinsed/Irrigated with Saline Topical Anesthetic Applied: None, Other: lidocaine 4%, Treatment Notes Wound #1 (Right, Lateral Malleolus) 1. Cleansed with: Clean wound with Normal Saline Cleanse wound with antibacterial soap and water 2. Anesthetic Topical Lidocaine 4% cream to wound bed prior to debridement 3. Peri-wound Care: Barrier cream Moisturizing lotion 4. Dressing Applied: Hydrafera Blue 5. Secondary Dressing Applied ABD Pad 7. Secured with Tape 3 Layer Compression System - Right Lower Extremity Notes unna to anchor, xtrasorb, drawtex Electronic Signature(s) Signed: 03/29/2017 4:34:22 PM By: Alejandro MullingPinkerton, Debra Entered By: Alejandro MullingPinkerton, Debra on 03/29/2017 08:07:31 [name], Aziel C.  ([id number]) -------------------------------------------------------------------------------- Wound Assessment Details Patient Name: [name], Adeeb C. Date of Service: 03/29/2017 8:00 AM Medical Record Number: [id number] Patient Account Number: [id number] Date of Birth/Sex: [birth year](70 y.o. Male) Treating RN: Ashok CordiaPinkerton, [name] Primary Care Amaurie Schreckengost: Darreld McleanMILES, LINDA Other Clinician: Referring Jaydalyn Demattia: Darreld McleanMILES, LINDA Treating Caeleigh Prohaska/Extender: Rudene ReBritto, Errol Weeks in Treatment: 62 Wound Status Wound Number: 2 Primary Venous Leg Ulcer Etiology: Wound Location: Right Malleolus - Medial Wound Open Wounding Event: Gradually Appeared Status: Date Acquired: 01/30/2016 Comorbid Cataracts, Chronic Obstructive Weeks Of Treatment: 60 History: Pulmonary Disease (COPD), Clustered Wound: Yes Osteoarthritis Photos Photo Uploaded By: Alejandro MullingPinkerton, Debra on 03/29/2017 11:38:12 Wound Measurements Length: (cm) 2.1 Width: (cm) 0.9 Depth: (cm) 0.2 Area: (cm) 1.484 Volume: (cm) 0.297 % Reduction in Area: 87.1% % Reduction in Volume: 74.3% Epithelialization: Small (1-33%) Tunneling: No Undermining: No Wound Description Full Thickness Without Exposed Classification: Support Structures Wound Margin: Distinct, outline attached Exudate Large Amount: Exudate Type: Serosanguineous Exudate Color: red, brown Foul Odor After Cleansing: No Slough/Fibrino Yes Wound Bed Granulation Amount: Medium (34-66%) Granulation Quality: Red Petit, Chou C. ([id number]) Necrotic Amount: Medium (34-66%) Necrotic Quality: Adherent Slough Periwound Skin Texture Texture Color No Abnormalities Noted: No No Abnormalities Noted: No Scarring: Yes Temperature / Pain Moisture Temperature: No Abnormality No Abnormalities Noted: No Tenderness on Palpation: Yes Maceration: Yes Wound Preparation Ulcer Cleansing: Rinsed/Irrigated with Saline Topical Anesthetic Applied: None, Other: lidocaine 4%, Treatment  Notes Wound #2 (Right, Medial Malleolus) 1. Cleansed with: Clean wound with Normal Saline Cleanse wound with antibacterial soap and water 2. Anesthetic Topical Lidocaine 4% cream to wound bed prior to debridement 3. Peri-wound Care: Barrier cream Moisturizing lotion 4. Dressing Applied: Hydrafera Blue 5. Secondary Dressing Applied ABD Pad 7. Secured with Tape 3 Layer Compression System - Right Lower Extremity Notes unna to anchor, xtrasorb, drawtex Electronic Signature(s) Signed: 03/29/2017 4:34:22 PM By: Alejandro MullingPinkerton, Debra Entered By: Alejandro MullingPinkerton, Debra on 03/29/2017 08:07:57

## 2017-04-02 ENCOUNTER — Encounter: Payer: Medicare HMO | Admitting: Physician Assistant

## 2017-04-02 DIAGNOSIS — I87331 Chronic venous hypertension (idiopathic) with ulcer and inflammation of right lower extremity: Secondary | ICD-10-CM | POA: Diagnosis not present

## 2017-04-03 NOTE — Progress Notes (Signed)
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 on Iodoflex and had a 4 week trial of sorbact, Polymen AG and a long courses of Aquacel Ag. He is not a candidate for advanced treatment options for many reasons 04/02/17 on evaluation today patient continues to show signs of improvement in regard to his right lower extremity wounds. He does have some discomfort but this is  ready to be around a 1-2 out of 10 only worse really with debridement typically although he tells me it's not too painful this morning. He has been tolerating the dressing without complication. He has no sign of local or systemic infection specifically no nausea, vomiting, or diarrhea. He has been doing well with the [facility] Dressing. Objective Constitutional Well-nourished and well-hydrated in no acute distress. Vitals Time Taken: 8:03 AM, Height: 76 in, Weight: 238 lbs, BMI: 29, Temperature: 98.0 F, Pulse: 75 bpm, Respiratory Rate: 18 breaths/min, Blood  Pressure: 117/53 mmHg. Respiratory normal breathing without difficulty. Psychiatric this patient is able to make decisions and demonstrates good insight into disease process. Alert and Oriented x 3. pleasant and cooperative. General Notes: 04/02/17 on evaluation today patient continues to show signs of improvement in regard to his right lower extremity wounds. He does have some discomfort but this is ready to be around a 1-2 out of 10 only worse really with debridement typically although he tells me it's not too painful this morning. He has been tolerating the dressing without complication. He has no sign of local or systemic infection specifically no nausea, vomiting, or diarrhea. He has been doing well with the [facility] Dressing. MARTINEZ, BOXX ([id number]) Integumentary (Hair, Skin) Wound #1 status is Open. Original cause of wound was Gradually Appeared. The wound is located on the Right,Lateral Malleolus. The wound measures 3.9cm length x 5cm width x 0.2cm depth; 15.315cm^2 area and 3.063cm^3 volume. There is no tunneling or undermining noted. There is a large amount of serosanguineous drainage noted. The wound margin is distinct with the outline attached to the wound base. There is medium (34-66%) red granulation within the wound bed. There is a medium (34-66%) amount of necrotic tissue within the wound bed including  Adherent Slough. The periwound skin appearance exhibited: Scarring, Maceration. Periwound temperature was noted as No Abnormality. The periwound has tenderness on palpation. Wound #2 status is Open. Original cause of wound was Gradually Appeared. The wound is located on the Right,Medial Malleolus. The wound measures 2.1cm length x 1.8cm width x 0.2cm depth; 2.969cm^2 area and 0.594cm^3 volume. There is no tunneling or undermining noted. There is a large amount of serosanguineous drainage noted. The wound margin is distinct with the outline attached to the wound base. There is medium (34-66%) red granulation within the wound bed. There is a medium (34-66%) amount of necrotic tissue within the wound bed including Adherent Slough. The periwound skin appearance exhibited: Scarring, Maceration. Periwound temperature was noted as No Abnormality. The periwound has tenderness on palpation. 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 There was a Skin/Subcutaneous Tissue Debridement ([id number]) debridement with total area of 19.5 sq cm performed by STONE III, Tavita Eastham E., PA-C. with the following instrument(s): Curette to remove Viable and Non-Viable tissue/material including Exudate, Fibrin/Slough,  and Subcutaneous after achieving pain control using Lidocaine 4% Topical Solution. A time out was conducted at 08:35, prior to the start of the procedure. A Minimum amount Rodell, Alberto C. ([id number]) of bleeding was controlled with Pressure. The procedure was tolerated well with a pain level of 0 throughout and a pain level of 0 following the procedure. Post Debridement Measurements: 3.9cm length x 5cm width x 0.3cm depth; 4.595cm^3 volume. Character of Wound/Ulcer Post Debridement requires further debridement. Severity of Tissue Post Debridement is: Fat layer exposed. Post procedure Diagnosis Wound #1: Same as Pre-Procedure Wound #2 Pre-procedure diagnosis of Wound #2 is a Venous  Leg Ulcer located on the Right,Medial Malleolus .Severity of Tissue Pre Debridement is: Fat layer exposed. There was a Skin/Subcutaneous Tissue Debridement ([id number]) debridement with total area of 3.78 sq cm performed by STONE III, Saydie Gerdts E., PA-C. with the following instrument(s): Curette to remove Viable and Non-Viable tissue/material including Exudate, Fibrin/Slough, and Subcutaneous after achieving pain control using Lidocaine 4% Topical Solution. A time out was conducted at 08:35, prior to the start of the procedure. A Minimum amount of bleeding was controlled with Pressure. The procedure was tolerated well with a pain level of 0 throughout and a pain level of 0 following the procedure. Post Debridement Measurements: 2.1cm length x 1.9cm width x 0.2cm depth; 0.627cm^3 volume. Character of Wound/Ulcer Post Debridement requires further debridement. Severity of Tissue Post Debridement is: Fat layer exposed. Post procedure Diagnosis Wound #2: Same as Pre-Procedure Plan Wound Cleansing: Wound #1 Right,Lateral Malleolus: Cleanse wound with mild soap and water May shower with protection. No tub bath. Wound #2 Right,Medial Malleolus: Cleanse wound with mild soap and water May shower with protection. No  tub bath. Anesthetic: Wound #1 Right,Lateral Malleolus: Topical Lidocaine 4% cream applied to wound bed prior to debridement Wound #2 Right,Medial Malleolus: Topical Lidocaine 4% cream applied to wound bed prior to debridement Skin Barriers/Peri-Wound Care: Wound #1 Right,Lateral Malleolus: Barrier cream - zinc oxide Triamcinolone Acetonide Ointment Primary Wound Dressing: Wound #1 Right,Lateral Malleolus: Hydrafera Blue EVERTTE, SONES. ([id number]) Wound #2 Right,Medial Malleolus: Hydrafera Blue Secondary Dressing: Wound #1 Right,Lateral Malleolus: ABD pad XtraSorb Drawtex Wound #2 Right,Medial Malleolus: ABD pad Drawtex Drawtex Dressing Change Frequency: Wound #1 Right,Lateral Malleolus: Change dressing every week Other: - nurse visit Friday Wound #2 Right,Medial Malleolus: Change dressing every week Other: - nurse visit Friday Follow-up Appointments: Wound #1 Right,Lateral Malleolus: Return Appointment in 1 week. Other: - nurse visit Friday Wound #2 Right,Medial Malleolus: Return Appointment in 1 week. Other: - nurse visit Friday Edema Control: Wound #1 Right,Lateral Malleolus: 3 Layer Compression System - Right Lower Extremity - UNNA TO ANCHOR Elevate legs to the level of the heart and pump ankles as often as possible Wound #2 Right,Medial Malleolus: 3 Layer Compression System - Right Lower Extremity - UNNA TO ANCHOR Elevate legs to the level of the heart and pump ankles as often as possible Additional Orders / Instructions: Wound #1 Right,Lateral Malleolus: Increase protein intake. OK to return to work with the following restrictions: Activity as tolerated Other: - Run Apligraph through insurance Wound #2 Right,Medial Malleolus: Increase protein intake. OK to return to work with the following restrictions: Activity as tolerated General Notes: I'm going to recommend that we continue with the current wound care measures per the above orders for the next  week. We will see him for reevaluation in one weeks time. Post debridement his wound look excellent as far as the wound bed is concerned. If anything worsens you will contact the office for additional recommendations otherwise we will see him in one week. CONSTANTINO, STARACE ([id number]) Electronic Signature(s) Signed: 04/02/2017 5:26:05 PM By: Lenda Kelp PA-C Entered By: Lenda Kelp on 04/02/2017 08:46:47 [name] ([id number]) -------------------------------------------------------------------------------- SuperBill Details Patient Name: [name] Date of Service: 04/02/2017 Medical Record Number: [id number] Patient Account Number: [id number] Date of Birth/Sex: [birth year] (72 y.o. 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, initial T85.613A encounter Disruption of external operation (surgical) wound, not elsewhere classified, subsequent T81.31XD encounter L97.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 of skin Facility Procedures CPT4 Code Description: [id number] 11042 - DEB SUBQ TISSUE 20 SQ CM/< ICD-10 Description Diagnosis L97.212 Non-pressure chronic ulcer of right calf with fat lay L97.211 Non-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 to b Modifier: er exposed reakdown of sk Quantity: 1 in CPT4 Code Description: [id number] 11045 - DEB SUBQ TISS EA ADDL 20CM ICD-10 Description Diagnosis L97.212 Non-pressure chronic ulcer  of right calf with fat lay L97.211 Non-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 to b Modifier: er exposed reakdown of sk Quantity: 1 in Physician Procedures CPT4 Code Description: [id number] 11042 - WC PHYS SUBQ TISS 20 SQ CM ICD-10 Description Diagnosis L97.212 Non-pressure chronic ulcer of right calf with fat lay L97.211 Non-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 to b ENOC, GETTER. ([id number]) Modifier: er exposed reakdown of sk Quantity: 1 in Electronic Signature(s) Signed: 04/02/2017 5:26:05 PM By: Lenda Kelp PA-C Entered By: Lenda Kelp on 04/02/2017 08:47:27

## 2017-04-04 NOTE — Progress Notes (Signed)
[name] ([id number]) Visit Report for 04/02/2017 Arrival Information Details Patient Name: [name]. Date of Service: 04/02/2017 8:00 AM Medical Record Number: [id number] Patient Account Number: [id number] Date of Birth/Sex: 02/12/1946 (71 y.o. Male) Treating RN: [name] Primary Care Kerolos Nehme: [name] Other Clinician: Referring Todd Jelinski: [name] Treating Diann Bangerter/Extender: Linwood Dibbles, HOYT Weeks in Treatment: 63 Visit Information History Since Last Visit All ordered tests and consults were completed: No Patient Arrived: Ambulatory Added or deleted any medications: No Arrival Time: 08:02 Any new allergies or adverse reactions: No Accompanied By: self Had a fall or experienced change in No Transfer Assistance: None activities of daily living that may affect Patient Identification Verified: Yes risk of falls: Secondary Verification Process Yes Signs or symptoms of abuse/neglect since last No Completed: visito Patient Requires Transmission-Based No Hospitalized since last visit: No Precautions: Has Dressing in Place as Prescribed: Yes Patient Has Alerts: No Has Compression in Place as Prescribed: Yes Pain Present Now: No Electronic Signature(s) Signed: 04/02/2017 5:39:54 PM By: Alejandro Mulling Entered By: Alejandro Mulling on 04/02/2017 08:03:30 [name] ([id number]) -------------------------------------------------------------------------------- Encounter Discharge Information Details Patient Name: [name] Date of Service: 04/02/2017 8:00 AM Medical Record Number: [id number] Patient Account Number: [id number] Date of Birth/Sex: [birth year] (71 y.o. Male) Treating RN: [name] Primary Care Dajuan Turnley: [name] Other Clinician: Referring Love Chowning: [name] Treating Amarachi Kotz/Extender: STONE III, HOYT Weeks in Treatment: 40 Encounter Discharge Information Items Discharge Pain Level: 0 Discharge Condition:  Stable Ambulatory Status: Ambulatory Discharge Destination: Home Transportation: Private Auto Accompanied By: self Schedule Follow-up Appointment: Yes Medication Reconciliation completed No and provided to Patient/Care Thailan Sava: Patient Clinical Summary of Care: Declined Electronic Signature(s) Signed: 04/02/2017 5:39:54 PM By: Alejandro Mulling Previous Signature: 04/02/2017 8:54:00 AM Version By: Gwenlyn Perking Entered By: Alejandro Mulling on 04/02/2017 14:45:49 [name] ([id number]) -------------------------------------------------------------------------------- Lower Extremity Assessment Details Patient Name: [name] Date of Service: 04/02/2017 8:00 AM Medical Record Number: [id number] Patient Account Number: [id number] Date of Birth/Sex: February 09, 1946 (71 y.o. Male) Treating RN: [name] Primary Care Rosa Wyly: [name] Other Clinician: Referring Huckleberry Martinson: [name] Treating Barri Neidlinger/Extender: STONE III, HOYT Weeks in Treatment: 63 Vascular Assessment Pulses: Dorsalis Pedis Palpable: [Right:Yes] Posterior Tibial Extremity colors, hair growth, and conditions: Extremity Color: [Right:Normal] Temperature of Extremity: [Right:Warm] Capillary Refill: [Right:< 3 seconds] Toe Nail Assessment Left: Right: Thick: Yes Discolored: Yes Deformed: No Improper Length and Hygiene: No Electronic Signature(s) Signed: 04/02/2017 5:39:54 PM By: Alejandro Mulling Entered By: Alejandro Mulling on 04/02/2017 08:35:17 [name] ([id number]) -------------------------------------------------------------------------------- Multi Wound Chart Details Patient Name: [name] Date of Service: 04/02/2017 8:00 AM Medical Record Number: [id number] Patient Account Number: [id number] Date of Birth/Sex: 1945-11-18 (71 y.o. Male) Treating RN: Ashok Cordia, [name] Primary Care Naomie Crow: [name] Other Clinician: Referring Eylin Pontarelli: [name] Treating  Laverne Klugh/Extender: STONE III, HOYT Weeks in Treatment: 63 Vital Signs Height(in): 76 Pulse(bpm): 75 Weight(lbs): 238 Blood Pressure 117/53 (mmHg): Body Mass Index(BMI): 29 Temperature(F): 98.0 Respiratory Rate 18 (breaths/min): Photos: [1:No Photos] [2:No Photos] [N/A:N/A] Wound Location: [1:Right Malleolus - Lateral] [2:Right Malleolus - Medial] [N/A:N/A] Wounding Event: [1:Gradually Appeared] [2:Gradually Appeared] [N/A:N/A] Primary Etiology: [1:Venous Leg Ulcer] [2:Venous Leg Ulcer] [N/A:N/A] Comorbid History: [1:Cataracts, Chronic Obstructive Pulmonary Disease (COPD), Osteoarthritis] [2:Cataracts, Chronic Obstructive Pulmonary Disease (COPD), Osteoarthritis] [N/A:N/A] Date Acquired: [1:08/11/2015] [2:01/30/2016] [N/A:N/A] Weeks of Treatment: [1:63] [2:61] [N/A:N/A] Wound Status: [1:Open] [2:Open] [N/A:N/A] Clustered Wound: [1:No] [2:Yes] [N/A:N/A] Measurements L x W x D 3.9x5x0.2 [2:2.1x1.8x0.2] [N/A:N/A] (cm) Area (cm) : [1:15.315] [2:2.969] [N/A:N/A] Volume (cm) : [  1:3.063] [2:0.594] [N/A:N/A] % Reduction in Area: [1:11.40%] [2:74.30%] [N/A:N/A] % Reduction in Volume: 40.90% [2:48.60%] [N/A:N/A] Classification: [1:Full Thickness Without Exposed Support Structures] [2:Full Thickness Without Exposed Support Structures] [N/A:N/A] Exudate Amount: [1:Large] [2:Large] [N/A:N/A] Exudate Type: [1:Serosanguineous] [2:Serosanguineous] [N/A:N/A] Exudate Color: [1:red, brown] [2:red, brown] [N/A:N/A] Wound Margin: [1:Distinct, outline attached] [2:Distinct, outline attached] [N/A:N/A] Granulation Amount: [1:Medium (34-66%)] [2:Medium (34-66%)] [N/A:N/A] Granulation Quality: [1:Red] [2:Red] [N/A:N/A] Necrotic Amount: [1:Medium (34-66%)] [2:Medium (34-66%)] [N/A:N/A] Epithelialization: [1:Small (1-33%)] [2:Small (1-33%)] [N/A:N/A] Periwound Skin Texture: Scarring: Yes [2:Scarring: Yes] [N/A:N/A] Periwound Skin Maceration: Yes Maceration: Yes N/A Moisture: Periwound Skin Color: No  Abnormalities Noted No Abnormalities Noted N/A Temperature: No Abnormality No Abnormality N/A Tenderness on Yes Yes N/A Palpation: Wound Preparation: Ulcer Cleansing: Ulcer Cleansing: N/A Rinsed/Irrigated with Rinsed/Irrigated with Saline Saline Topical Anesthetic Topical Anesthetic Applied: Other: lidocaine Applied: Other: lidocaine 4% 4% Treatment Notes Electronic Signature(s) Signed: 04/02/2017 5:39:54 PM By: Alejandro MullingPinkerton, Debra Entered By: Alejandro MullingPinkerton, Debra on 04/02/2017 08:35:37 [name] StackBLACKWELL, Kauan C. ([id number]) -------------------------------------------------------------------------------- Multi-Disciplinary Care Plan Details Patient Name: [name] StackBLACKWELL, Terren C. Date of Service: 04/02/2017 8:00 AM Medical Record Number: [id number] Patient Account Number: [id number] Date of Birth/Sex: 11-Nov-1945 [age] y.o. Male) Treating RN: Phillis HaggisPinkerton, [name] Primary Care Amelio Brosky: Darreld McleanMILES, LINDA Other Clinician: Referring Louretta Tantillo: Darreld McleanMILES, LINDA Treating Jasher Barkan/Extender: STONE III, HOYT Weeks in Treatment: 63 Active Inactive ` Venous Leg Ulcer Nursing Diagnoses: Knowledge deficit related to disease process and management Goals: Patient will maintain optimal edema control Date Initiated: 04/03/2016 Target Resolution Date: 11/24/2016 Goal Status: Active Interventions: Compression as ordered Treatment Activities: Therapeutic compression applied : 04/03/2016 Notes: ` Wound/Skin Impairment Nursing Diagnoses: Impaired tissue integrity Goals: Ulcer/skin breakdown will heal within 14 weeks Date Initiated: 01/17/2016 Target Resolution Date: 11/24/2016 Goal Status: Active Interventions: Assess ulceration(s) every visit Notes: Electronic Signature(s) Signed: 04/02/2017 5:39:54 PM By: Aurelio JewPinkerton, Debra Platz, [name] ([id number]) Entered By: Alejandro MullingPinkerton, Debra on 04/02/2017 08:35:28 [name] StackBLACKWELL, Eshaan C. ([id number]) -------------------------------------------------------------------------------- Pain  Assessment Details Patient Name: [name] StackBLACKWELL, Steadman C. Date of Service: 04/02/2017 8:00 AM Medical Record Number: [id number] Patient Account Number: [id number] Date of Birth/Sex: 11-Nov-1945 7(70 y.o. Male) Treating RN: Phillis HaggisPinkerton, [name] Primary Care Kerisha Goughnour: Darreld McleanMILES, LINDA Other Clinician: Referring Nephi Savage: Darreld McleanMILES, LINDA Treating Shaneil Yazdi/Extender: STONE III, HOYT Weeks in Treatment: 63 Active Problems Location of Pain Severity and Description of Pain Patient Has Paino No Site Locations With Dressing Change: No Pain Management and Medication Current Pain Management: Electronic Signature(s) Signed: 04/02/2017 5:39:54 PM By: Alejandro MullingPinkerton, Debra Entered By: Alejandro MullingPinkerton, Debra on 04/02/2017 08:03:37 [name] StackBLACKWELL, Esther C. ([id number]) -------------------------------------------------------------------------------- Patient/Caregiver Education Details Patient Name: [name] StackBLACKWELL, Masiyah C. Date of Service: 04/02/2017 8:00 AM Medical Record Number: [id number] Patient Account Number: [id number] Date of Birth/Gender: 11-Nov-1945 [age] y.o. Male) Treating RN: Phillis HaggisPinkerton, [name] Primary Care Physician: Darreld McleanMILES, LINDA Other Clinician: Referring Physician: Darreld McleanMILES, LINDA Treating Physician/Extender: Linwood DibblesSTONE III, HOYT Weeks in Treatment: 9963 Education Assessment Education Provided To: Patient Education Topics Provided Wound/Skin Impairment: Handouts: Other: change dressing as ordered Methods: Demonstration, Explain/Verbal Responses: State content correctly Electronic Signature(s) Signed: 04/02/2017 5:39:54 PM By: Alejandro MullingPinkerton, Debra Entered By: Alejandro MullingPinkerton, Debra on 04/02/2017 14:46:02 [name] StackBLACKWELL, Johnthomas C. ([id number]) -------------------------------------------------------------------------------- Wound Assessment Details Patient Name: [name] StackBLACKWELL, Hason C. Date of Service: 04/02/2017 8:00 AM Medical Record Number: [id number] Patient Account Number: [id number] Date of Birth/Sex: 11-Nov-1945 [age] y.o. Male) Treating RN: Ashok CordiaPinkerton,  [name] Primary Care Brion Sossamon: Darreld McleanMILES, LINDA Other Clinician: Referring Paisley Grajeda: Darreld McleanMILES, LINDA Treating Brit Carbonell/Extender: STONE III, HOYT Weeks in Treatment: 63 Wound Status Wound Number: 1 Primary Venous Leg Ulcer Etiology: Wound Location: Right Malleolus - Lateral Wound Open Wounding Event: Gradually  Appeared Status: Date Acquired: 08/11/2015 Comorbid Cataracts, Chronic Obstructive Weeks Of Treatment: 63 History: Pulmonary Disease (COPD), Clustered Wound: No Osteoarthritis Photos Photo Uploaded By: Alejandro Mulling on 04/02/2017 17:25:09 Wound Measurements Length: (cm) 3.9 Width: (cm) 5 Depth: (cm) 0.2 Area: (cm) 15.315 Volume: (cm) 3.063 % Reduction in Area: 11.4% % Reduction in Volume: 40.9% Epithelialization: Small (1-33%) Tunneling: No Undermining: No Wound Description Full Thickness Without Exposed Foul Odor Afte Classification: Support Structures Slough/Fibrino Wound Margin: Distinct, outline attached Exudate Large Amount: Exudate Type: Serosanguineous Exudate Color: red, brown r Cleansing: No Yes Wound Bed Granulation Amount: Medium (34-66%) Granulation Quality: Red Hougland, Klint C. ([id number]) Necrotic Amount: Medium (34-66%) Necrotic Quality: Adherent Slough Periwound Skin Texture Texture Color No Abnormalities Noted: No No Abnormalities Noted: No Scarring: Yes Temperature / Pain Moisture Temperature: No Abnormality No Abnormalities Noted: No Tenderness on Palpation: Yes Maceration: Yes Wound Preparation Ulcer Cleansing: Rinsed/Irrigated with Saline Topical Anesthetic Applied: Other: lidocaine 4%, Treatment Notes Wound #1 (Right, Lateral Malleolus) 1. Cleansed with: Clean wound with Normal Saline 2. Anesthetic Topical Lidocaine 4% cream to wound bed prior to debridement 3. Peri-wound Care: Barrier cream Moisturizing lotion 4. Dressing Applied: Hydrafera Blue 5. Secondary Dressing Applied ABD Pad 7. Secured with Tape 3 Layer  Compression System - Right Lower Extremity Notes unna to anchor, xtrasorb, drawtex Electronic Signature(s) Signed: 04/02/2017 5:39:54 PM By: Alejandro Mulling Entered By: Alejandro Mulling on 04/02/2017 08:16:44 [name] ([id number]) -------------------------------------------------------------------------------- Wound Assessment Details Patient Name: [name] Date of Service: 04/02/2017 8:00 AM Medical Record Number: [id number] Patient Account Number: [id number] Date of Birth/Sex: 1946/04/10 (71 y.o. Male) Treating RN: Ashok Cordia, [name] Primary Care Konstantinos Cordoba: [name] Other Clinician: Referring Eaven Schwager: [name] Treating Kristien Salatino/Extender: STONE III, HOYT Weeks in Treatment: 63 Wound Status Wound Number: 2 Primary Venous Leg Ulcer Etiology: Wound Location: Right Malleolus - Medial Wound Open Wounding Event: Gradually Appeared Status: Date Acquired: 01/30/2016 Comorbid Cataracts, Chronic Obstructive Weeks Of Treatment: 61 History: Pulmonary Disease (COPD), Clustered Wound: Yes Osteoarthritis Photos Photo Uploaded By: Alejandro Mulling on 04/02/2017 17:25:09 Wound Measurements Length: (cm) 2.1 Width: (cm) 1.8 Depth: (cm) 0.2 Area: (cm) 2.969 Volume: (cm) 0.594 % Reduction in Area: 74.3% % Reduction in Volume: 48.6% Epithelialization: Small (1-33%) Tunneling: No Undermining: No Wound Description Full Thickness Without Exposed Foul Odor Afte Classification: Support Structures Slough/Fibrino Wound Margin: Distinct, outline attached Exudate Large Amount: Exudate Type: Serosanguineous Exudate Color: red, brown r Cleansing: No Yes Wound Bed Granulation Amount: Medium (34-66%) Granulation Quality: Red Brocksmith, Seann C. ([id number]) Necrotic Amount: Medium (34-66%) Necrotic Quality: Adherent Slough Periwound Skin Texture Texture Color No Abnormalities Noted: No No Abnormalities Noted: No Scarring: Yes Temperature / Pain Moisture  Temperature: No Abnormality No Abnormalities Noted: No Tenderness on Palpation: Yes Maceration: Yes Wound Preparation Ulcer Cleansing: Rinsed/Irrigated with Saline Topical Anesthetic Applied: Other: lidocaine 4%, Treatment Notes Wound #2 (Right, Medial Malleolus) 1. Cleansed with: Clean wound with Normal Saline 2. Anesthetic Topical Lidocaine 4% cream to wound bed prior to debridement 3. Peri-wound Care: Barrier cream Moisturizing lotion 4. Dressing Applied: Hydrafera Blue 5. Secondary Dressing Applied ABD Pad 7. Secured with Tape 3 Layer Compression System - Right Lower Extremity Notes unna to anchor, xtrasorb, drawtex Electronic Signature(s) Signed: 04/02/2017 5:39:54 PM By: Alejandro Mulling Entered By: Alejandro Mulling on 04/02/2017 08:16:23 [name] ([id number]) -------------------------------------------------------------------------------- Vitals Details Patient Name: [name] Date of Service: 04/02/2017 8:00 AM Medical Record Number: [id number] Patient Account Number: [id number] Date of Birth/Sex: 1946/08/05 (71 y.o. Male) Treating RN: Ashok Cordia, [place]  Primary Care Avi Archuleta: MILES, LINDA Other Clinician: Referring Deovion Batrez: [name] Treating Temara Lanum/Extender: STONE III, HOYT Weeks in Treatment: 63 Vital Signs Time Taken: 08:03 Temperature (F): 98.0 Height (in): 76 Pulse (bpm): 75 Weight (lbs): 238 Respiratory Rate (breaths/min): 18 Body Mass Index (BMI): 29 Blood Pressure (mmHg): 117/53 Reference Range: 80 - 120 mg / dl Electronic Signature(s) Signed: 04/02/2017 5:39:54 PM By: Alejandro Mulling Entered By: Alejandro Mulling on 04/02/2017 08:05:57

## 2017-04-05 DIAGNOSIS — I87331 Chronic venous hypertension (idiopathic) with ulcer and inflammation of right lower extremity: Secondary | ICD-10-CM | POA: Diagnosis not present

## 2017-04-07 NOTE — Progress Notes (Signed)
[name], [name] ([id number]) Visit Report for 04/05/2017 Arrival Information Details Patient Name: [name], [name]. Date of Service: 04/05/2017 9:45 AM Medical Record Number: [id number] Patient Account Number: [id number] Date of Birth/Sex: 20-Oct-1945 (71 y.o. Male) Treating RN: [name] Primary Care Adien Kimmel: [name] Other Clinician: Referring Sulo Janczak: [name] Treating Filipe Greathouse/Extender: Linwood Dibbles, HOYT Weeks in Treatment: 63 Visit Information History Since Last Visit All ordered tests and consults were completed: No Patient Arrived: Ambulatory Added or deleted any medications: No Arrival Time: 09:17 Any new allergies or adverse reactions: No Accompanied By: self Had a fall or experienced change in No Transfer Assistance: None activities of daily living that may affect Patient Identification Verified: Yes risk of falls: Secondary Verification Process Yes Signs or symptoms of abuse/neglect since last No Completed: visito Patient Requires Transmission-Based No Hospitalized since last visit: No Precautions: Has Dressing in Place as Prescribed: Yes Patient Has Alerts: No Has Compression in Place as Prescribed: Yes Pain Present Now: No Electronic Signature(s) Signed: 04/05/2017 4:51:41 PM By: Alejandro Mulling Entered By: Alejandro Mulling on 04/05/2017 09:18:21 [name] ([id number]) -------------------------------------------------------------------------------- Encounter Discharge Information Details Patient Name: [name] Date of Service: 04/05/2017 9:45 AM Medical Record Number: [id number] Patient Account Number: [id number] Date of Birth/Sex: 02/17/1946 (71 y.o. Male) Treating RN: [name] Primary Care Piotr Christopher: [name] Other Clinician: Referring Yesha Muchow: [name] Treating Tanveer Brammer/Extender: STONE III, HOYT Weeks in Treatment: 52 Encounter Discharge Information Items Discharge Pain Level: 0 Discharge  Condition: Stable Ambulatory Status: Ambulatory Discharge Destination: Home Private Transportation: Auto Accompanied By: self Schedule Follow-up Appointment: Yes Medication Reconciliation completed and No provided to Patient/Care Krissa Utke: Clinical Summary of Care: Electronic Signature(s) Signed: 04/05/2017 4:51:41 PM By: Alejandro Mulling Entered By: Alejandro Mulling on 04/05/2017 09:20:55 [name] ([id number]) -------------------------------------------------------------------------------- Patient/Caregiver Education Details Patient Name: [name] Date of Service: 04/05/2017 9:45 AM Medical Record Number: [id number] Patient Account Number: [id number] Date of Birth/Gender: 09/12/1945 (71 y.o. Male) Treating RN: [name] Primary Care Physician: [name] Other Clinician: Referring Physician: [name] Treating Physician/Extender: Lenda Kelp Weeks in Treatment: 101 Education Assessment Education Provided To: Patient Education Topics Provided Wound/Skin Impairment: Handouts: Other: cahnge dressing as ordered Methods: Demonstration, Explain/Verbal Responses: State content correctly Electronic Signature(s) Signed: 04/05/2017 4:51:41 PM By: Alejandro Mulling Entered By: Alejandro Mulling on 04/05/2017 09:20:41 [name] ([id number]) -------------------------------------------------------------------------------- Wound Assessment Details Patient Name: [name] Date of Service: 04/05/2017 9:45 AM Medical Record Number: [id number] Patient Account Number: [id number] Date of Birth/Sex: 01/28/1946 (71 y.o. Male) Treating RN: Ashok Cordia, [name] Primary Care Kortny Lirette: [name] Other Clinician: Referring Luci Bellucci: [name] Treating Roseana Rhine/Extender: STONE III, HOYT Weeks in Treatment: 63 Wound Status Wound Number: 1 Primary Venous Leg Ulcer Etiology: Wound Location: Right Malleolus - Lateral Wound Open Wounding  Event: Gradually Appeared Status: Date Acquired: 08/11/2015 Comorbid Cataracts, Chronic Obstructive Weeks Of Treatment: 63 History: Pulmonary Disease (COPD), Clustered Wound: No Osteoarthritis Photos Photo Uploaded By: Alejandro Mulling on 04/05/2017 13:12:32 Wound Measurements Length: (cm) 3.9 Width: (cm) 5 Depth: (cm) 0.2 Area: (cm) 15.315 Volume: (cm) 3.063 % Reduction in Area: 11.4% % Reduction in Volume: 40.9% Epithelialization: Small (1-33%) Tunneling: No Undermining: No Wound Description Full Thickness Without Exposed Classification: Support Structures Wound Margin: Distinct, outline attached Exudate Large Amount: Exudate Type: Serosanguineous Exudate Color: red, brown Foul Odor After Cleansing: No Slough/Fibrino Yes Wound Bed Granulation Amount: Medium (34-66%) Granulation Quality: Red Boike, Alyssa C. ([id number]) Necrotic Amount: Medium (34-66%) Necrotic Quality: Adherent Slough Periwound Skin Texture Texture Color No  Abnormalities Noted: No No Abnormalities Noted: No Scarring: Yes Temperature / Pain Moisture Temperature: No Abnormality No Abnormalities Noted: No Tenderness on Palpation: Yes Maceration: Yes Wound Preparation Ulcer Cleansing: Rinsed/Irrigated with Saline Topical Anesthetic Applied: None, Other: lidocaine 4%, Treatment Notes Wound #1 (Right, Lateral Malleolus) 1. Cleansed with: Clean wound with Normal Saline Cleanse wound with antibacterial soap and water 3. Peri-wound Care: Barrier cream Moisturizing lotion 4. Dressing Applied: Hydrafera Blue 5. Secondary Dressing Applied ABD Pad 7. Secured with Tape 3 Layer Compression System - Right Lower Extremity Notes xtrasorb, drawtex Electronic Signature(s) Signed: 04/05/2017 4:51:41 PM By: Alejandro MullingPinkerton, Debra Entered By: Alejandro MullingPinkerton, Debra on 04/05/2017 09:19:02 [name] StackBLACKWELL, Cecilio C.  ([id number]) -------------------------------------------------------------------------------- Wound Assessment Details Patient Name: [name] StackBLACKWELL, Jamerson C. Date of Service: 04/05/2017 9:45 AM Medical Record Number: [id number] Patient Account Number: [id number] Date of Birth/Sex: [birth year] [age] y.o. Male) Treating RN: Ashok CordiaPinkerton, [name] Primary Care Meghann Landing: Darreld McleanMILES, LINDA Other Clinician: Referring Nance Mccombs: Darreld McleanMILES, LINDA Treating Milea Klink/Extender: STONE III, HOYT Weeks in Treatment: 63 Wound Status Wound Number: 2 Primary Venous Leg Ulcer Etiology: Wound Location: Right Malleolus - Medial Wound Open Wounding Event: Gradually Appeared Status: Date Acquired: 01/30/2016 Comorbid Cataracts, Chronic Obstructive Weeks Of Treatment: 61 History: Pulmonary Disease (COPD), Clustered Wound: Yes Osteoarthritis Photos Photo Uploaded By: Alejandro MullingPinkerton, Debra on 04/05/2017 13:12:33 Wound Measurements Length: (cm) 2.1 Width: (cm) 1.8 Depth: (cm) 0.2 Area: (cm) 2.969 Volume: (cm) 0.594 % Reduction in Area: 74.3% % Reduction in Volume: 48.6% Epithelialization: Small (1-33%) Tunneling: No Undermining: No Wound Description Full Thickness Without Exposed Classification: Support Structures Wound Margin: Distinct, outline attached Exudate Large Amount: Exudate Type: Serosanguineous Exudate Color: red, brown Foul Odor After Cleansing: No Slough/Fibrino Yes Wound Bed Granulation Amount: Medium (34-66%) Granulation Quality: Red Gillian, Davyd C. ([id number]) Necrotic Amount: Medium (34-66%) Necrotic Quality: Adherent Slough Periwound Skin Texture Texture Color No Abnormalities Noted: No No Abnormalities Noted: No Scarring: Yes Temperature / Pain Moisture Temperature: No Abnormality No Abnormalities Noted: No Tenderness on Palpation: Yes Maceration: Yes Wound Preparation Ulcer Cleansing: Rinsed/Irrigated with Saline Topical Anesthetic Applied: None, Other: lidocaine 4%, Treatment  Notes Wound #2 (Right, Medial Malleolus) 1. Cleansed with: Clean wound with Normal Saline Cleanse wound with antibacterial soap and water 3. Peri-wound Care: Barrier cream Moisturizing lotion 4. Dressing Applied: Hydrafera Blue 5. Secondary Dressing Applied ABD Pad 7. Secured with Tape 3 Layer Compression System - Right Lower Extremity Notes xtrasorb, drawtex Electronic Signature(s) Signed: 04/05/2017 4:51:41 PM By: Alejandro MullingPinkerton, Debra Entered By: Alejandro MullingPinkerton, Debra on 04/05/2017 09:19:26

## 2017-04-09 ENCOUNTER — Encounter: Payer: Medicare HMO | Admitting: Physician Assistant

## 2017-04-09 DIAGNOSIS — I87331 Chronic venous hypertension (idiopathic) with ulcer and inflammation of right lower extremity: Secondary | ICD-10-CM | POA: Diagnosis not present

## 2017-04-11 NOTE — Progress Notes (Signed)
[name] ([id number]) Visit Report for 04/09/2017 Arrival Information Details Patient Name: [name]. Date of Service: 04/09/2017 8:00 AM Medical Record Number: [id number] Patient Account Number: [id number] Date of Birth/Sex: March 09, 1946 (71 y.o. Male) Treating RN: [name] Primary Care Emeric Novinger: [name] Other Clinician: Referring Izek Corvino: [name] Treating Luisdaniel Kenton/Extender: Linwood Dibbles, HOYT Weeks in Treatment: 64 Visit Information History Since Last Visit All ordered tests and consults were completed: No Patient Arrived: Ambulatory Added or deleted any medications: No Arrival Time: 08:04 Any new allergies or adverse reactions: No Accompanied By: self Had a fall or experienced change in No Transfer Assistance: None activities of daily living that may affect Patient Identification Verified: Yes risk of falls: Secondary Verification Process Yes Signs or symptoms of abuse/neglect since last No Completed: visito Patient Requires Transmission-Based No Hospitalized since last visit: No Precautions: Has Dressing in Place as Prescribed: Yes Patient Has Alerts: No Has Compression in Place as Prescribed: Yes Pain Present Now: No Electronic Signature(s) Signed: 04/09/2017 4:46:49 PM By: Alejandro Mulling Entered By: Alejandro Mulling on 04/09/2017 08:04:30 [name] ([id number]) -------------------------------------------------------------------------------- Encounter Discharge Information Details Patient Name: [name] Date of Service: 04/09/2017 8:00 AM Medical Record Number: [id number] Patient Account Number: [id number] Date of Birth/Sex: 1946-05-06 (71 y.o. Male) Treating RN: [name] Primary Care Alany Borman: [name] Other Clinician: Referring Zailyn Rowser: [name] Treating Anntionette Madkins/Extender: STONE III, HOYT Weeks in Treatment: 57 Encounter Discharge Information Items Discharge Pain Level: 0 Discharge  Condition: Stable Ambulatory Status: Ambulatory Discharge Destination: Home Private Transportation: Auto Accompanied By: self Schedule Follow-up Appointment: Yes Medication Reconciliation completed and No provided to Patient/Care Inice Sanluis: Clinical Summary of Care: Electronic Signature(s) Signed: 04/09/2017 4:46:49 PM By: Alejandro Mulling Entered By: Alejandro Mulling on 04/09/2017 09:25:56 [name] ([id number]) -------------------------------------------------------------------------------- Lower Extremity Assessment Details Patient Name: [name] Date of Service: 04/09/2017 8:00 AM Medical Record Number: [id number] Patient Account Number: [id number] Date of Birth/Sex: [birth year] (71 y.o. Male) Treating RN: [name] Primary Care Dan Scearce: [name] Other Clinician: Referring Shateka Petrea: [name] Treating Maanav Kassabian/Extender: STONE III, HOYT Weeks in Treatment: 64 Vascular Assessment Pulses: Dorsalis Pedis Palpable: [Right:Yes] Posterior Tibial Extremity colors, hair growth, and conditions: Extremity Color: [Right:Normal] Temperature of Extremity: [Right:Warm] Capillary Refill: [Right:< 3 seconds] Toe Nail Assessment Left: Right: Thick: Yes Discolored: Yes Deformed: No Improper Length and Hygiene: Yes Electronic Signature(s) Signed: 04/09/2017 4:46:49 PM By: Alejandro Mulling Entered By: Alejandro Mulling on 04/09/2017 08:19:02 [name] ([id number]) -------------------------------------------------------------------------------- Multi Wound Chart Details Patient Name: [name] Date of Service: 04/09/2017 8:00 AM Medical Record Number: [id number] Patient Account Number: [id number] Date of Birth/Sex: 1946-08-05 (71 y.o. Male) Treating RN: Ashok Cordia, [name] Primary Care Sharah Finnell: [name] Other Clinician: Referring Amaia Lavallie: [name] Treating Deserai Cansler/Extender: STONE III, HOYT Weeks in Treatment: 64 Vital  Signs Height(in): 76 Pulse(bpm): 75 Weight(lbs): 238 Blood Pressure 144/70 (mmHg): Body Mass Index(BMI): 29 Temperature(F): 98.0 Respiratory Rate 18 (breaths/min): Photos: [1:No Photos] [2:No Photos] [N/A:N/A] Wound Location: [1:Right Malleolus - Lateral] [2:Right Malleolus - Medial] [N/A:N/A] Wounding Event: [1:Gradually Appeared] [2:Gradually Appeared] [N/A:N/A] Primary Etiology: [1:Venous Leg Ulcer] [2:Venous Leg Ulcer] [N/A:N/A] Comorbid History: [1:Cataracts, Chronic Obstructive Pulmonary Disease (COPD), Osteoarthritis] [2:Cataracts, Chronic Obstructive Pulmonary Disease (COPD), Osteoarthritis] [N/A:N/A] Date Acquired: [1:08/11/2015] [2:01/30/2016] [N/A:N/A] Weeks of Treatment: [1:64] [2:62] [N/A:N/A] Wound Status: [1:Open] [2:Open] [N/A:N/A] Clustered Wound: [1:No] [2:Yes] [N/A:N/A] Measurements L x W x D 3.5x3.5x0.2 [2:2x1.9x0.2] [N/A:N/A] (cm) Area (cm) : [1:9.621] [2:2.985] [N/A:N/A] Volume (cm) : [1:1.924] [2:0.597] [N/A:N/A] % Reduction in Area: [1:44.30%] [2:74.10%] [N/A:N/A] %  Reduction in Volume: 62.90% [2:48.30%] [N/A:N/A] Classification: [1:Full Thickness Without Exposed Support Structures] [2:Full Thickness Without Exposed Support Structures] [N/A:N/A] Exudate Amount: [1:Large] [2:Large] [N/A:N/A] Exudate Type: [1:Serosanguineous] [2:Serosanguineous] [N/A:N/A] Exudate Color: [1:red, brown] [2:red, brown] [N/A:N/A] Wound Margin: [1:Distinct, outline attached] [2:Distinct, outline attached] [N/A:N/A] Granulation Amount: [1:Medium (34-66%)] [2:Medium (34-66%)] [N/A:N/A] Granulation Quality: [1:Red] [2:Red] [N/A:N/A] Necrotic Amount: [1:Medium (34-66%)] [2:Medium (34-66%)] [N/A:N/A] Epithelialization: [1:Small (1-33%)] [2:Small (1-33%)] [N/A:N/A] Periwound Skin Texture: Scarring: Yes [2:Scarring: Yes] [N/A:N/A] Periwound Skin Maceration: Yes Maceration: Yes N/A Moisture: Periwound Skin Color: No Abnormalities Noted No Abnormalities Noted N/A Temperature: No  Abnormality No Abnormality N/A Tenderness on Yes Yes N/A Palpation: Wound Preparation: Ulcer Cleansing: Ulcer Cleansing: N/A Rinsed/Irrigated with Rinsed/Irrigated with Saline Saline Topical Anesthetic Topical Anesthetic Applied: Other: lidocaine Applied: Other: lidocaine 4% 4% Treatment Notes Electronic Signature(s) Signed: 04/09/2017 4:46:49 PM By: Alejandro MullingPinkerton, Debra Entered By: Alejandro MullingPinkerton, Debra on 04/09/2017 08:21:06 [name] StackBLACKWELL, Erven C. ([id number]) -------------------------------------------------------------------------------- Multi-Disciplinary Care Plan Details Patient Name: [name] StackBLACKWELL, Lawernce C. Date of Service: 04/09/2017 8:00 AM Medical Record Number: [id number] Patient Account Number: [id number] Date of Birth/Sex: 01/08/1946 [age] y.o. Male) Treating RN: Phillis HaggisPinkerton, [name] Primary Care Raun Routh: Darreld McleanMILES, LINDA Other Clinician: Referring Uzma Hellmer: Darreld McleanMILES, LINDA Treating Susy Placzek/Extender: STONE III, HOYT Weeks in Treatment: 64 Active Inactive ` Venous Leg Ulcer Nursing Diagnoses: Knowledge deficit related to disease process and management Goals: Patient will maintain optimal edema control Date Initiated: 04/03/2016 Target Resolution Date: 11/24/2016 Goal Status: Active Interventions: Compression as ordered Treatment Activities: Therapeutic compression applied : 04/03/2016 Notes: ` Wound/Skin Impairment Nursing Diagnoses: Impaired tissue integrity Goals: Ulcer/skin breakdown will heal within 14 weeks Date Initiated: 01/17/2016 Target Resolution Date: 11/24/2016 Goal Status: Active Interventions: Assess ulceration(s) every visit Notes: Electronic Signature(s) Signed: 04/09/2017 4:46:49 PM By: Aurelio JewPinkerton, Debra Lurry, [name] ([id number]) Entered By: Alejandro MullingPinkerton, Debra on 04/09/2017 08:19:14 [name] StackBLACKWELL, Marcquis C. ([id number]) -------------------------------------------------------------------------------- Pain Assessment Details Patient Name: [name] StackBLACKWELL, Brittain C. Date  of Service: 04/09/2017 8:00 AM Medical Record Number: [id number] Patient Account Number: [id number] Date of Birth/Sex: 01/08/1946 3(70 y.o. Male) Treating RN: Phillis HaggisPinkerton, [name] Primary Care Adama Ivins: Darreld McleanMILES, LINDA Other Clinician: Referring Tymel Conely: Darreld McleanMILES, LINDA Treating Jaziah Goeller/Extender: STONE III, HOYT Weeks in Treatment: 64 Active Problems Location of Pain Severity and Description of Pain Patient Has Paino No Site Locations With Dressing Change: No Pain Management and Medication Current Pain Management: Electronic Signature(s) Signed: 04/09/2017 4:46:49 PM By: Alejandro MullingPinkerton, Debra Entered By: Alejandro MullingPinkerton, Debra on 04/09/2017 08:04:40 [name] StackBLACKWELL, Shreyansh C. ([id number]) -------------------------------------------------------------------------------- Patient/Caregiver Education Details Patient Name: [name] StackBLACKWELL, Arye C. Date of Service: 04/09/2017 8:00 AM Medical Record Number: [id number] Patient Account Number: [id number] Date of Birth/Gender: 01/08/1946 [age] y.o. Male) Treating RN: Phillis HaggisPinkerton, [name] Primary Care Physician: Darreld McleanMILES, LINDA Other Clinician: Referring Physician: Darreld McleanMILES, LINDA Treating Physician/Extender: Lenda KelpSTONE III, HOYT Weeks in Treatment: 8464 Education Assessment Education Provided To: Patient Education Topics Provided Wound/Skin Impairment: Handouts: Other: change dressing as ordered Methods: Demonstration, Explain/Verbal Responses: State content correctly Electronic Signature(s) Signed: 04/09/2017 4:46:49 PM By: Alejandro MullingPinkerton, Debra Entered By: Alejandro MullingPinkerton, Debra on 04/09/2017 08:23:16 [name] StackBLACKWELL, Keanan C. ([id number]) -------------------------------------------------------------------------------- Wound Assessment Details Patient Name: [name] StackBLACKWELL, Josemanuel C. Date of Service: 04/09/2017 8:00 AM Medical Record Number: [id number] Patient Account Number: [id number] Date of Birth/Sex: 01/08/1946 [age] y.o. Male) Treating RN: Phillis HaggisPinkerton, [name] Primary Care Gustaf Mccarter: Darreld McleanMILES, LINDA Other  Clinician: Referring Rhena Glace: Darreld McleanMILES, LINDA Treating Sears Oran/Extender: STONE III, HOYT Weeks in Treatment: 64 Wound Status Wound Number: 1 Primary Venous Leg Ulcer Etiology: Wound Location: Right Malleolus - Lateral Wound Open Wounding Event: Gradually Appeared Status: Date Acquired: 08/11/2015 Comorbid Cataracts, Chronic Obstructive Weeks Of  Treatment: 64 History: Pulmonary Disease (COPD), Clustered Wound: No Osteoarthritis Photos Photo Uploaded By: Alejandro Mulling on 04/09/2017 16:12:03 Wound Measurements Length: (cm) 3.5 Width: (cm) 3.5 Depth: (cm) 0.2 Area: (cm) 9.621 Volume: (cm) 1.924 % Reduction in Area: 44.3% % Reduction in Volume: 62.9% Epithelialization: Small (1-33%) Tunneling: No Undermining: No Wound Description Full Thickness Without Exposed Foul Odor Afte Classification: Support Structures Slough/Fibrino Wound Margin: Distinct, outline attached Exudate Large Amount: Exudate Type: Serosanguineous Exudate Color: red, brown r Cleansing: No Yes Wound Bed Granulation Amount: Medium (34-66%) Granulation Quality: Red Kluver, Gavan C. ([id number]) Necrotic Amount: Medium (34-66%) Necrotic Quality: Adherent Slough Periwound Skin Texture Texture Color No Abnormalities Noted: No No Abnormalities Noted: No Scarring: Yes Temperature / Pain Moisture Temperature: No Abnormality No Abnormalities Noted: No Tenderness on Palpation: Yes Maceration: Yes Wound Preparation Ulcer Cleansing: Rinsed/Irrigated with Saline Topical Anesthetic Applied: Other: lidocaine 4%, Treatment Notes Wound #1 (Right, Lateral Malleolus) 1. Cleansed with: Clean wound with Normal Saline 2. Anesthetic Topical Lidocaine 4% cream to wound bed prior to debridement 4. Dressing Applied: Hydrafera Blue 5. Secondary Dressing Applied ABD Pad Kerlix/Conform 7. Secured with Tape Notes drawtex Electronic Signature(s) Signed: 04/09/2017 4:46:49 PM By: Alejandro Mulling Entered By: Alejandro Mulling on 04/09/2017 08:18:38 [name] ([id number]) -------------------------------------------------------------------------------- Wound Assessment Details Patient Name: [name] Date of Service: 04/09/2017 8:00 AM Medical Record Number: [id number] Patient Account Number: [id number] Date of Birth/Sex: 09/03/1945 (71 y.o. Male) Treating RN: Ashok Cordia, [name] Primary Care Hooper Petteway: [name] Other Clinician: Referring Asmaa Tirpak: [name] Treating Eben Choinski/Extender: STONE III, HOYT Weeks in Treatment: 64 Wound Status Wound Number: 2 Primary Venous Leg Ulcer Etiology: Wound Location: Right Malleolus - Medial Wound Open Wounding Event: Gradually Appeared Status: Date Acquired: 01/30/2016 Comorbid Cataracts, Chronic Obstructive Weeks Of Treatment: 62 History: Pulmonary Disease (COPD), Clustered Wound: Yes Osteoarthritis Photos Photo Uploaded By: Alejandro Mulling on 04/09/2017 16:12:33 Wound Measurements Length: (cm) 2 Width: (cm) 1.9 Depth: (cm) 0.2 Area: (cm) 2.985 Volume: (cm) 0.597 % Reduction in Area: 74.1% % Reduction in Volume: 48.3% Epithelialization: Small (1-33%) Tunneling: No Undermining: No Wound Description Full Thickness Without Exposed Foul Odor Afte Classification: Support Structures Slough/Fibrino Wound Margin: Distinct, outline attached Exudate Large Amount: Exudate Type: Serosanguineous Exudate Color: red, brown r Cleansing: No Yes Wound Bed Granulation Amount: Medium (34-66%) Granulation Quality: Red Gatlin, Baylen C. ([id number]) Necrotic Amount: Medium (34-66%) Necrotic Quality: Adherent Slough Periwound Skin Texture Texture Color No Abnormalities Noted: No No Abnormalities Noted: No Scarring: Yes Temperature / Pain Moisture Temperature: No Abnormality No Abnormalities Noted: No Tenderness on Palpation: Yes Maceration: Yes Wound Preparation Ulcer Cleansing: Rinsed/Irrigated  with Saline Topical Anesthetic Applied: Other: lidocaine 4%, Treatment Notes Wound #2 (Right, Medial Malleolus) 1. Cleansed with: Clean wound with Normal Saline 2. Anesthetic Topical Lidocaine 4% cream to wound bed prior to debridement 4. Dressing Applied: Hydrafera Blue 5. Secondary Dressing Applied ABD Pad Kerlix/Conform 7. Secured with Tape Notes drawtex Electronic Signature(s) Signed: 04/09/2017 4:46:49 PM By: Alejandro Mulling Entered By: Alejandro Mulling on 04/09/2017 08:16:58 [name] ([id number]) -------------------------------------------------------------------------------- Vitals Details Patient Name: [name] Date of Service: 04/09/2017 8:00 AM Medical Record Number: [id number] Patient Account Number: [id number] Date of Birth/Sex: 07/19/1946 (71 y.o. Male) Treating RN: Ashok Cordia, [name] Primary Care Nautica Hotz: [name] Other Clinician: Referring Haylyn Halberg: [name] Treating Karmelo Bass/Extender: STONE III, HOYT Weeks in Treatment: 64 Vital Signs Time Taken: 08:04 Temperature (F): 98.0 Height (in): 76 Pulse (bpm): 75 Weight (lbs): 238 Respiratory Rate (breaths/min): 18 Body Mass Index (BMI): 29 Blood  Pressure (mmHg): 144/70 Reference Range: 80 - 120 mg / dl Electronic Signature(s) Signed: 04/09/2017 4:46:49 PM By: Alejandro Mulling Entered By: Alejandro Mulling on 04/09/2017 08:06:32

## 2017-04-11 NOTE — Progress Notes (Signed)
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 does not have swelling anymore compared to what he has had in the past following his vascular intervention. He wonders if potentially we could attempt avoiding the rats to see if he could cleanse the wound in between to try to prevent some of the  fiber and its buildup that occurs in the interim between when we see him week to week. No fevers, chills, nausea, or vomiting noted at this time. Objective Constitutional Well-nourished and well-hydrated in no acute distress. Vitals Time Taken: 8:04 AM, Height: 76 in, Weight: 238 lbs, BMI: 29, Temperature: 98.0 F, Pulse: 75 bpm, Respiratory Rate: 18 breaths/min, Blood Pressure: 144/70 mmHg. Respiratory normal breathing without difficulty. Cardiovascular no clubbing, cyanosis, significant edema, Psychiatric this patient is able to make decisions and demonstrates good insight into disease process. Alert and Oriented x 3. pleasant and cooperative. SALEEM, COCCIA ([id number]) General Notes: Patient's wound shows  no evidence of erythema surrounding at this point. He has been tolerating the debridement which was required today early well. Fortunately he does not have any significant discomfort. Two out of 10 seems to be about the worst his pain got during that time. Integumentary (Hair, Skin) Wound #1 status is Open. Original cause of wound was Gradually Appeared. The wound is located on the Right,Lateral Malleolus. The wound measures 3.5cm length x 3.5cm width x 0.2cm depth; 9.621cm^2 area and 1.924cm^3 volume. There is no tunneling or undermining noted. There is a large amount of serosanguineous drainage noted. The wound margin is distinct with the outline attached to the wound base. There is medium (34-66%) red granulation within the wound bed. There is a medium (34-66%) amount of necrotic tissue within the wound bed including Adherent Slough. The periwound skin appearance exhibited: Scarring, Maceration. Periwound temperature was noted as No Abnormality. The periwound has tenderness on palpation. Wound #2 status is Open. Original cause of wound was Gradually Appeared. The wound is located on the Right,Medial Malleolus. The wound measures 2cm length x 1.9cm width x 0.2cm depth;  2.985cm^2 area and 0.597cm^3 volume. There is no tunneling or undermining noted. There is a large amount of serosanguineous drainage noted. The wound margin is distinct with the outline attached to the wound base. There is medium (34-66%) red granulation within the wound bed. There is a medium (34-66%) amount of necrotic tissue within the wound bed including Adherent Slough. The periwound skin appearance exhibited: Scarring, Maceration. Periwound temperature was noted as No Abnormality. The periwound has tenderness on palpation. 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 There was a Skin/Subcutaneous Tissue Debridement ([id number]) debridement with total area of 12.25 sq cm performed by STONE III, [place], Shakim C. ([id number]) HOYT E., PA-C. with the following instrument(s): Curette to remove Viable and Non-Viable tissue/material including Exudate, Fibrin/Slough, and Subcutaneous after achieving pain control using Lidocaine 4% Topical Solution. A time out was conducted at 08:33, prior to the start of the procedure. A Minimum amount of bleeding was controlled with Pressure. The procedure was tolerated well with a pain level of 0 throughout and a pain level of 0 following the  procedure. Post Debridement Measurements: 3.5cm length x 3.5cm width x 0.2cm depth; 1.924cm^3 volume. Character of Wound/Ulcer Post Debridement requires further debridement. Severity of Tissue Post Debridement is: Fat layer exposed. 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 There was a Skin/Subcutaneous Tissue Debridement ([id number]) debridement with total area of 3.8 sq cm performed by STONE III, HOYT E., PA-C. with the following instrument(s): Curette to remove Viable and Non-Viable tissue/material including Exudate, Fibrin/Slough, and Subcutaneous after achieving pain control using Lidocaine 4% Topical Solution. A time out was conducted at 08:33, prior to the start of the procedure. A  Minimum amount of bleeding was controlled with Pressure. The procedure was tolerated well with a pain level of 0 throughout and a pain level of 0 following the procedure. Post Debridement Measurements: 2cm length x 1.9cm width x 0.2cm depth; 0.597cm^3 volume. Character of Wound/Ulcer Post Debridement requires further debridement. Severity of Tissue Post Debridement is: Fat layer exposed. Post procedure Diagnosis Wound #2: Same as Pre-Procedure Plan Wound Cleansing: Wound #1 Right,Lateral Malleolus: Clean wound with Normal Saline. Cleanse wound with mild soap and water May Shower, gently pat wound dry prior to applying new dressing. Wound #2 Right,Medial Malleolus: Clean wound with Normal Saline. Cleanse wound with mild soap and water May Shower, gently pat wound dry prior to applying new dressing. Anesthetic: Wound #1 Right,Lateral Malleolus: Topical Lidocaine 4% cream applied to wound bed prior to debridement Wound #2 Right,Medial Malleolus: Topical Lidocaine 4% cream applied to wound bed prior to debridement Skin  Barriers/Peri-Wound Care: Wound #1 Right,Lateral Malleolus: Barrier cream - zinc oxide Triamcinolone Acetonide Ointment Geralds, Arhaan C. ([id number]) Primary Wound Dressing: Wound #1 Right,Lateral Malleolus: Hydrafera Blue Wound #2 Right,Medial Malleolus: Hydrafera Blue Secondary Dressing: Wound #1 Right,Lateral Malleolus: ABD pad Conform/Kerlix Drawtex Wound #2 Right,Medial Malleolus: ABD pad Conform/Kerlix Drawtex Dressing Change Frequency: Wound #1 Right,Lateral Malleolus: Change dressing every other day. Other: - PRN visit Friday Wound #2 Right,Medial Malleolus: Change dressing every other day. Other: - PRN visit Friday Follow-up Appointments: Wound #1 Right,Lateral Malleolus: Return Appointment in 1 week. Other: - nurse visit Friday Wound #2 Right,Medial Malleolus: Return Appointment in 1 week. Other: - nurse visit Friday Edema Control: Wound #1 Right,Lateral Malleolus: Elevate legs to the level of the heart and pump ankles as often as possible Wound #2 Right,Medial Malleolus: Elevate legs to the level of the heart and pump ankles as often as possible Additional Orders / Instructions: Wound #1 Right,Lateral Malleolus: Increase protein intake. OK to return to work with the following restrictions: Activity as tolerated Wound #2 Right,Medial Malleolus: Increase protein intake. OK to return to work with the following restrictions: Activity as tolerated I discussed with patient today that potentially we could attempt to going the week without a wrap on to see if this could make a difference for him. With that being said there is some risk to doing this if he does start to [facility], [place] C. ([id number]) swell we will need to get the wraps back on therefore he does not need to wait until it's too late in order for [place] to rewrap. So we will not place the wraps on at this point that way he can get to the wound to clean it in the interim between now and when we see  him back in one week. We will continue with the [facility] Dressing feel like this is helping him. And subsequently if anything starts to worsen he agrees to contact us before next week's evaluation in order for [place] to go ahead and get him back and to rewrap his leg. Otherwise hopefully this will help prevent some of the fiber this buildup/slough that he has been experiencing really significantly week to week. Electronic Signature(s) Signed: 04/10/2017 1:25:22 AM By: Lenda Kelp PA-C Entered By: Lenda Kelp on 04/09/2017 08:50:56 [name] ([id number]) -------------------------------------------------------------------------------- SuperBill Details Patient Name: [name] Date of Service: 04/09/2017 Medical Record Number: [id number] Patient Account Number: [id number] Date of Birth/Sex: [birth year] (71 y.o. 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, initial T85.613A encounter Disruption of external operation (surgical) wound, not elsewhere classified, subsequent T81.31XD encounter L97.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 of skin Facility Procedures CPT4 Code: [id number] Description: 11042 - DEB SUBQ TISSUE 20 SQ CM/< ICD-10 Description Diagnosis L97.212 Non-pressure chronic ulcer of right calf with fat la Modifier: yer exposed Quantity: 1 Physician Procedures CPT4 Code: [id number] Description: 11042 - WC PHYS SUBQ  TISS 20 SQ CM ICD-10 Description Diagnosis L97.212 Non-pressure chronic ulcer of right calf with fat la Modifier: yer exposed Quantity: 1 Electronic Signature(s) Signed: 04/10/2017 1:25:22 AM By: Lenda Kelp PA-C Entered By: Lenda Kelp on 04/09/2017 08:51:34

## 2017-04-12 ENCOUNTER — Ambulatory Visit: Payer: Medicare HMO

## 2017-04-16 ENCOUNTER — Encounter: Payer: Medicare HMO | Admitting: Internal Medicine

## 2017-04-16 DIAGNOSIS — I87331 Chronic venous hypertension (idiopathic) with ulcer and inflammation of right lower extremity: Secondary | ICD-10-CM | POA: Diagnosis not present

## 2017-04-17 NOTE — Progress Notes (Signed)
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 base. There is large (67-100%)  red granulation within the wound  bed. There is a small (1-33%) amount of necrotic tissue within the wound bed including Adherent Slough. The periwound skin appearance exhibited: Scarring, Maceration. Periwound temperature was noted as No Abnormality. The periwound has tenderness on palpation. 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 There was a Skin/Subcutaneous Tissue Debridement ([id number]) debridement with total area of 11.9 sq cm performed by [name], MD. with the following instrument(s): Curette and scoop to remove Viable and Non-Viable tissue/material including Exudate, Fibrin/Slough, and Subcutaneous after achieving pain control using Lidocaine 4% Topical Solution. A time out was conducted at 08:21, prior to the start of the procedure. A Minimum amount of bleeding was controlled with Pressure. The procedure was tolerated well with a pain level of 0 throughout and a pain level of 0 following the procedure. Post Debridement Measurements: 3.4cm Goldwire, Turki C. ([id number]) length x 3.5cm width x 0.2cm depth; 1.869cm^3 volume. Character of Wound/Ulcer Post Debridement requires further debridement. Severity of Tissue Post Debridement is: Fat layer exposed. Post procedure Diagnosis Wound #1: Same as  Pre-Procedure Plan Wound Cleansing: Wound #1 Right,Lateral Malleolus: Clean wound with Normal Saline. Cleanse wound with mild soap and water May Shower, gently pat wound dry prior to applying new dressing. Wound #2 Right,Medial Malleolus: Clean wound with Normal Saline. Cleanse wound with mild soap and water May Shower, gently pat wound dry prior to applying new dressing. Anesthetic: Wound #1 Right,Lateral Malleolus: Topical Lidocaine 4% cream applied to wound bed prior to debridement Wound #2 Right,Medial Malleolus: Topical Lidocaine 4% cream applied to wound bed prior to debridement Skin Barriers/Peri-Wound Care: Wound #1 Right,Lateral Malleolus: Barrier cream - zinc oxide Triamcinolone Acetonide Ointment Primary Wound Dressing: Wound #1 Right,Lateral Malleolus: Hydrafera Blue Wound #2 Right,Medial Malleolus: Hydrafera Blue Secondary Dressing: Wound #1 Right,Lateral Malleolus: ABD pad Conform/Kerlix Drawtex Wound #2 Right,Medial Malleolus: ABD pad Conform/Kerlix Drawtex Dressing Change Frequency: Wound #1 Right,Lateral Malleolus: Change dressing every other day. Other: - PRN visit Friday Wound #2 Right,Medial Malleolus: Change dressing every other day. IVO, MOGA ([id number]) Other: - PRN visit Friday Follow-up Appointments: Wound #1 Right,Lateral Malleolus: Return Appointment in 1 week. Other: - nurse visit Friday Wound #2 Right,Medial Malleolus: Return Appointment in 1 week. Other: - nurse visit Friday Edema Control: Wound #1 Right,Lateral Malleolus: Elevate legs to the level of the heart and pump ankles as often as possible Other: - wear own compression stockings Wound #2 Right,Medial Malleolus: Elevate legs to the level of the heart and pump ankles as often as possible Other: - wear own compression stockings Additional Orders / Instructions: Wound #1 Right,Lateral Malleolus: Increase protein intake. OK to return to work with the following  restrictions: Activity as tolerated Wound #2 Right,Medial Malleolus: Increase protein intake. OK to return to work with the following restrictions: Activity as tolerated #1 we'll continue the Hydrofera Blue/ABDs/Kerlix change every second day. We gave him Deborasoft which is essentially a  scrub brush for home surface debridement. We'll see if this helps with the surface of this wound. May need to review my notes on [employer] Electronic Signature(s) Signed: 04/16/2017 5:34:31 PM By: [name] MD Entered By: [name] on 04/16/2017 08:36:48 [name] ([id number]) -------------------------------------------------------------------------------- SuperBill Details [name] Date of Service: 04/16/2017 Patient Name: C. Patient Account Number: [id number] Medical Record Treating RN: [name] [id number] Number: Other Clinician: Date of Birth/Sex: Feb 19, 1946 (71 y.o. 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, initial T85.613A encounter Disruption of external operation (surgical) wound, not elsewhere classified, subsequent T81.31XD encounter L97.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 of skin Facility Procedures CPT4: Description Modifier Quantity Code 08657846 11042 - DEB SUBQ TISSUE 20 SQ CM/< 1 ICD-10 Description Diagnosis L97.212 Non-pressure chronic ulcer of right calf with fat layer exposed I87.331 Chronic venous hypertension (idiopathic) with ulcer and   inflammation of right lower extremity Physician Procedures CPT4: Description Modifier Quantity Code [id number] 11042 - WC PHYS SUBQ TISS 20 SQ CM 1 ICD-10 Description Diagnosis L97.212 Non-pressure chronic ulcer of right calf with fat layer exposed I87.331 Chronic venous hypertension (idiopathic) with ulcer and  inflammation of right lower extremity Electronic Signature(s) MOSTAFA, YUAN ([id number]) Signed: 04/16/2017 5:34:31 PM By: [name] MD Entered By: [name] on 04/16/2017 08:37:22

## 2017-04-17 NOTE — Progress Notes (Signed)
TREMON, HOLLAN ([id number]) Visit Report for 04/16/2017 Arrival Information Details Patient Name: [name], [name]. Date of Service: 04/16/2017 8:00 AM Medical Record Number: [id number] Patient Account Number: [id number] Date of Birth/Sex: 30-Nov-1945 (71 y.o. Male) Treating RN: [name] Primary Care Amand Lemoine: [name] Other Clinician: Referring Kalab Camps: [name] Treating Lydiana Milley/Extender: Alt[place]e [place] in Treatment: 65 Visit Information History Since Last Visit All ordered tests and consults were completed: No Patient Arrived: Ambulatory Added or deleted any medications: No Arrival Time: 08:05 Any new allergies or adverse reactions: No Accompanied By: self Had a fall or experienced change in No Transfer Assistance: None activities of daily living that may affect Patient Identification Verified: Yes risk of falls: Secondary Verification Process Yes Signs or symptoms of abuse/neglect since last No Completed: visito Patient Requires Transmission- No Hospitalized since last visit: No Based Precautions: Has Dressing in Place as Prescribed: Yes Patient Has Alerts: Yes Pain Present Now: No Patient Alerts: Patient on Blood Thinner Electronic Signature(s) Signed: 04/16/2017 4:15:31 PM By: Alejandro Mulling Entered By: Alejandro Mulling on 04/16/2017 08:37:54 [name] ([id number]) -------------------------------------------------------------------------------- Encounter Discharge Information Details Patient Name: [name] Date of Service: 04/16/2017 8:00 AM Medical Record Number: [id number] Patient Account Number: [id number] Date of Birth/Sex: [birth year] (71 y.o. Male) Treating RN: [name] Primary Care Ritisha Deitrick: [name] Other Clinician: Referring Shrihan Putt: [name] Treating Anthany Thornhill/Extender: [name] Weeks in Treatment: 6 Encounter Discharge Information Items Discharge Pain Level: 0 Discharge  Condition: Stable Ambulatory Status: Ambulatory Discharge Destination: Home Transportation: Private Auto Accompanied By: self Schedule Follow-up Appointment: Yes Medication Reconciliation completed No and provided to Patient/Care Doretta Remmert: Patient Clinical Summary of Care: Declined Electronic Signature(s) Signed: 04/16/2017 4:15:31 PM By: Alejandro Mulling Entered By: Alejandro Mulling on 04/16/2017 08:41:42 [name] ([id number]) -------------------------------------------------------------------------------- Lower Extremity Assessment Details Patient Name: [name] Date of Service: 04/16/2017 8:00 AM Medical Record Number: [id number] Patient Account Number: [id number] Date of Birth/Sex: [birth year] (71 y.o. Male) Treating RN: [name] Primary Care Bobbe Quilter: [name] Other Clinician: Referring Tej Murdaugh: [name] Treating Bradrick Kamau/Extender: [name] Weeks in Treatment: 65 Vascular Assessment Pulses: Dorsalis Pedis Palpable: [Right:Yes] Posterior Tibial Extremity colors, hair growth, and conditions: Extremity Color: [Right:Normal] Temperature of Extremity: [Right:Warm] Capillary Refill: [Right:< 3 seconds] Toe Nail Assessment Left: Right: Thick: Yes Discolored: Yes Deformed: No Improper Length and Hygiene: Yes Electronic Signature(s) Signed: 04/16/2017 4:15:31 PM By: Alejandro Mulling Entered By: Alejandro Mulling on 04/16/2017 08:17:09 [name] ([id number]) -------------------------------------------------------------------------------- Multi Wound Chart Details Patient Name: [name] Date of Service: 04/16/2017 8:00 AM Medical Record Number: [id number] Patient Account Number: [id number] Date of Birth/Sex: August 12, 1946 (71 y.o. Male) Treating RN: Ashok Cordia, [name] Primary Care Yareliz Thorstenson: [name] Other Clinician: Referring Colon Rueth: [name] Treating Rorey Bisson/Extender: [name] Weeks in  Treatment: 65 Vital Signs Height(in): 76 Pulse(bpm): 74 Weight(lbs): 238 Blood Pressure 136/62 (mmHg): Body Mass Index(BMI): 29 Temperature(F): 98.0 Respiratory Rate 18 (breaths/min): Photos: [1:No Photos] [2:No Photos] [N/A:N/A] Wound Location: [1:Right Malleolus - Lateral] [2:Right Malleolus - Medial] [N/A:N/A] Wounding Event: [1:Gradually Appeared] [2:Gradually Appeared] [N/A:N/A] Primary Etiology: [1:Venous Leg Ulcer] [2:Venous Leg Ulcer] [N/A:N/A] Comorbid History: [1:Cataracts, Chronic Obstructive Pulmonary Disease (COPD), Osteoarthritis] [2:Cataracts, Chronic Obstructive Pulmonary Disease (COPD), Osteoarthritis] [N/A:N/A] Date Acquired: [1:08/11/2015] [2:01/30/2016] [N/A:N/A] Weeks of Treatment: [1:65] [2:63] [N/A:N/A] Wound Status: [1:Open] [2:Open] [N/A:N/A] Clustered Wound: [1:No] [2:Yes] [N/A:N/A] Measurements L x W x D 3.4x3.5x0.2 [2:2x2x0.2] [N/A:N/A] (cm) Area (cm) : [1:9.346] [2:3.142] [N/A:N/A] Volume (cm) : [1:1.869] [2:0.628] [N/A:N/A] % Reduction in Area: [1:45.90%] [  2:72.80%] [N/A:N/A] % Reduction in Volume: 63.90% [2:45.60%] [N/A:N/A] Classification: [1:Full Thickness Without Exposed Support Structures] [2:Full Thickness Without Exposed Support Structures] [N/A:N/A] Exudate Amount: [1:Large] [2:Large] [N/A:N/A] Exudate Type: [1:Serosanguineous] [2:Serosanguineous] [N/A:N/A] Exudate Color: [1:red, brown] [2:red, brown] [N/A:N/A] Wound Margin: [1:Distinct, outline attached] [2:Distinct, outline attached] [N/A:N/A] Granulation Amount: [1:Small (1-33%)] [2:Large (67-100%)] [N/A:N/A] Granulation Quality: [1:Red] [2:Red] [N/A:N/A] Necrotic Amount: [1:Large (67-100%)] [2:Small (1-33%)] [N/A:N/A] Epithelialization: [1:Small (1-33%)] [2:Small (1-33%)] [N/A:N/A] Debridement: [2:N/A] [N/A:N/A] Debridement ([id number]) Pre-procedure 08:21 N/A N/A Verification/Time Out Taken: Pain Control: Lidocaine 4% Topical N/A N/A Solution Tissue Debrided: Fibrin/Slough,  Exudates, N/A N/A Subcutaneous Level: Skin/Subcutaneous N/A N/A Tissue Debridement Area (sq 11.9 N/A N/A cm): Instrument: Curette, Other(scoop) N/A N/A Bleeding: Minimum N/A N/A Hemostasis Achieved: Pressure N/A N/A Procedural Pain: 0 N/A N/A Post Procedural Pain: 0 N/A N/A Debridement Treatment Procedure was tolerated N/A N/A Response: well Post Debridement 3.4x3.5x0.2 N/A N/A Measurements L x W x D (cm) Post Debridement 1.869 N/A N/A Volume: (cm) Periwound Skin Texture: Scarring: Yes Scarring: Yes N/A Periwound Skin Maceration: Yes Maceration: Yes N/A Moisture: Periwound Skin Color: No Abnormalities Noted No Abnormalities Noted N/A Temperature: No Abnormality No Abnormality N/A Tenderness on Yes Yes N/A Palpation: Wound Preparation: Ulcer Cleansing: Ulcer Cleansing: N/A Rinsed/Irrigated with Rinsed/Irrigated with Saline Saline Topical Anesthetic Topical Anesthetic Applied: Other: lidocaine Applied: Other: lidocaine 4% 4% Procedures Performed: Debridement N/A N/A Treatment Notes Electronic Signature(s) Signed: 04/16/2017 5:34:31 PM By: [name] MD Entered By: [name] on 04/16/2017 08:31:06 [name] ([id number]) -------------------------------------------------------------------------------- Multi-Disciplinary Care Plan Details Patient Name: [name] Date of Service: 04/16/2017 8:00 AM Medical Record Number: [id number] Patient Account Number: [id number] Date of Birth/Sex: 24-Aug-1946 (71 y.o. Male) Treating RN: [name] Primary Care Samari Gorby: [name] Other Clinician: Referring Kimberlynn Lumbra: [name] Treating Ankith Edmonston/Extender: [name] Weeks in Treatment: 65 Active Inactive ` Venous Leg Ulcer Nursing Diagnoses: Knowledge deficit related to disease process and management Goals: Patient will maintain optimal edema control Date Initiated: 04/03/2016 Target Resolution Date: 11/24/2016 Goal Status:  Active Interventions: Compression as ordered Treatment Activities: Therapeutic compression applied : 04/03/2016 Notes: ` Wound/Skin Impairment Nursing Diagnoses: Impaired tissue integrity Goals: Ulcer/skin breakdown will heal within 14 weeks Date Initiated: 01/17/2016 Target Resolution Date: 11/24/2016 Goal Status: Active Interventions: Assess ulceration(s) every visit Notes: Electronic Signature(s) Signed: 04/16/2017 4:15:31 PM By: Aurelio Jew, [name] ([id number]) Entered By: Alejandro Mulling on 04/16/2017 08:22:43 [name] ([id number]) -------------------------------------------------------------------------------- Pain Assessment Details Patient Name: [name] Date of Service: 04/16/2017 8:00 AM Medical Record Number: [id number] Patient Account Number: [id number] Date of Birth/Sex: 09/19/1945 (71 y.o. Male) Treating RN: [name] Primary Care Tishie Altmann: [name] Other Clinician: Referring Celia Friedland: [name] Treating Carren Blakley/Extender: [name] Weeks in Treatment: 65 Active Problems Location of Pain Severity and Description of Pain Patient Has Paino No Site Locations With Dressing Change: No Pain Management and Medication Current Pain Management: Electronic Signature(s) Signed: 04/16/2017 4:15:31 PM By: Alejandro Mulling Entered By: Alejandro Mulling on 04/16/2017 08:07:15 [name] ([id number]) -------------------------------------------------------------------------------- Patient/Caregiver Education Details [name] Date of Service: 04/16/2017 8:00 AM Patient Name: C. Patient Account Number: [id number] Medical Record Treating RN: [name] [id number] Number: Other Clinician: Date of Birth/Gender: March 20, 1946 (71 y.o. Male) Treating [name], [name] Primary Care Physician: [name] Physician/Extender: G Referring Physician: [name] in Treatment: 22 Education  Assessment Education Provided To: Patient Education Topics Provided Wound/Skin Impairment: Handouts: Other: change dressing as ordered Methods: Demonstration, Explain/Verbal Responses: State content correctly Electronic Signature(s) Signed: 04/16/2017  4:15:31 PM By: Alejandro Mulling Entered By: Alejandro Mulling on 04/16/2017 08:41:53 [name] ([id number]) -------------------------------------------------------------------------------- Wound Assessment Details Patient Name: [name] Date of Service: 04/16/2017 8:00 AM Medical Record Number: [id number] Patient Account Number: [id number] Date of Birth/Sex: 1945/12/16 (71 y.o. Male) Treating RN: Ashok Cordia, [name] Primary Care Mckenna Boruff: [name] Other Clinician: Referring Nyrah Demos: [name] Treating Denica Web/Extender: [name] Weeks in Treatment: 65 Wound Status Wound Number: 1 Primary Venous Leg Ulcer Etiology: Wound Location: Right Malleolus - Lateral Wound Open Wounding Event: Gradually Appeared Status: Date Acquired: 08/11/2015 Comorbid Cataracts, Chronic Obstructive Weeks Of Treatment: 65 History: Pulmonary Disease (COPD), Clustered Wound: No Osteoarthritis Photos Photo Uploaded By: Alejandro Mulling on 04/16/2017 10:20:22 Wound Measurements Length: (cm) 3.4 Width: (cm) 3.5 Depth: (cm) 0.2 Area: (cm) 9.346 Volume: (cm) 1.869 % Reduction in Area: 45.9% % Reduction in Volume: 63.9% Epithelialization: Small (1-33%) Tunneling: No Undermining: No Wound Description Full Thickness Without Exposed Foul Odor Afte Classification: Support Structures Slough/Fibrino Wound Margin: Distinct, outline attached Exudate Large Amount: Exudate Type: Serosanguineous Exudate Color: red, brown r Cleansing: No Yes Wound Bed Granulation Amount: Small (1-33%) Granulation Quality: Red [name], [name] ([id number]) Necrotic Amount: Large (67-100%) Necrotic Quality: Adherent  Slough Periwound Skin Texture Texture Color No Abnormalities Noted: No No Abnormalities Noted: No Scarring: Yes Temperature / Pain Moisture Temperature: No Abnormality No Abnormalities Noted: No Tenderness on Palpation: Yes Maceration: Yes Wound Preparation Ulcer Cleansing: Rinsed/Irrigated with Saline Topical Anesthetic Applied: Other: lidocaine 4%, Treatment Notes Wound #1 (Right, Lateral Malleolus) 1. Cleansed with: Clean wound with Normal Saline 2. Anesthetic Topical Lidocaine 4% cream to wound bed prior to debridement 4. Dressing Applied: Hydrafera Blue 5. Secondary Dressing Applied ABD Pad Kerlix/Conform 7. Secured with Tape Notes drawtex Electronic Signature(s) Signed: 04/16/2017 4:15:31 PM By: Alejandro Mulling Entered By: Alejandro Mulling on 04/16/2017 08:13:46 [name] ([id number]) -------------------------------------------------------------------------------- Wound Assessment Details Patient Name: [name] Date of Service: 04/16/2017 8:00 AM Medical Record Number: [id number] Patient Account Number: [id number] Date of Birth/Sex: 1945-09-16 (71 y.o. Male) Treating RN: Ashok Cordia, [name] Primary Care Arieonna Medine: [name] Other Clinician: Referring Korrey Schleicher: [name] Treating [name] Demonte/Extender: [name] Weeks in Treatment: 65 Wound Status Wound Number: 2 Primary Venous Leg Ulcer Etiology: Wound Location: Right Malleolus - Medial Wound Open Wounding Event: Gradually Appeared Status: Date Acquired: 01/30/2016 Comorbid Cataracts, Chronic Obstructive Weeks Of Treatment: 63 History: Pulmonary Disease (COPD), Clustered Wound: Yes Osteoarthritis Photos Photo Uploaded By: Alejandro Mulling on 04/16/2017 10:20:22 Wound Measurements Length: (cm) 2 Width: (cm) 2 Depth: (cm) 0.2 Area: (cm) 3.142 Volume: (cm) 0.628 % Reduction in Area: 72.8% % Reduction in Volume: 45.6% Epithelialization: Small (1-33%) Tunneling:  No Undermining: No Wound Description Full Thickness Without Exposed Foul Odor Afte Classification: Support Structures Slough/Fibrino Wound Margin: Distinct, outline attached Exudate Large Amount: Exudate Type: Serosanguineous Exudate Color: red, brown r Cleansing: No Yes Wound Bed Granulation Amount: Large (67-100%) Granulation Quality: Red Strawser, [name] ([id number]) Necrotic Amount: Small (1-33%) Necrotic Quality: Adherent Slough Periwound Skin Texture Texture Color No Abnormalities Noted: No No Abnormalities Noted: No Scarring: Yes Temperature / Pain Moisture Temperature: No Abnormality No Abnormalities Noted: No Tenderness on Palpation: Yes Maceration: Yes Wound Preparation Ulcer Cleansing: Rinsed/Irrigated with Saline Topical Anesthetic Applied: Other: lidocaine 4%, Treatment Notes Wound #2 (Right, Medial Malleolus) 1. Cleansed with: Clean wound with Normal Saline 2. Anesthetic Topical Lidocaine 4% cream to wound bed prior to debridement 4. Dressing Applied: Hydrafera Blue 5. Secondary Dressing Applied ABD Pad Kerlix/Conform 7. Secured with Tape  Notes drawtex Electronic Signature(s) Signed: 04/16/2017 4:15:31 PM By: Alejandro Mulling Entered By: Alejandro Mulling on 04/16/2017 08:14:30 [name] ([id number]) -------------------------------------------------------------------------------- Vitals Details Patient Name: [name] Date of Service: 04/16/2017 8:00 AM Medical Record Number: [id number] Patient Account Number: [id number] Date of Birth/Sex: October 01, 1945 (71 y.o. Male) Treating RN: Ashok Cordia, [name] Primary Care Kahli Mayon: [name] Other Clinician: Referring Qaadir Kent: [name] Treating Melea Prezioso/Extender: [name] Weeks in Treatment: 65 Vital Signs Time Taken: 08:08 Temperature (F): 98.0 Height (in): 76 Pulse (bpm): 74 Weight (lbs): 238 Respiratory Rate (breaths/min): 18 Body Mass Index (BMI): 29 Blood  Pressure (mmHg): 136/62 Reference Range: 80 - 120 mg / dl Electronic Signature(s) Signed: 04/16/2017 4:15:31 PM By: Alejandro Mulling Entered By: Alejandro Mulling on 04/16/2017 08:08:52

## 2017-04-23 ENCOUNTER — Encounter: Payer: Medicare HMO | Admitting: Internal Medicine

## 2017-04-23 DIAGNOSIS — I87331 Chronic venous hypertension (idiopathic) with ulcer and inflammation of right lower extremity: Secondary | ICD-10-CM | POA: Diagnosis not present

## 2017-04-25 NOTE — Progress Notes (Signed)
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 within the wound bed including Adherent Slough. The periwound skin appearance exhibited: Scarring, Maceration. Periwound temperature was noted as No Abnormality.  The periwound has tenderness on palpation. Wound #2 status is Open.  Original cause of wound was Gradually Appeared. The wound is located on the Right,Medial Malleolus. The wound measures 2.3cm length x 1.8cm width x 0.2cm depth; 3.252cm^2 area and 0.65cm^3 volume. There is no tunneling or undermining noted. There is a large amount of serosanguineous drainage noted. The wound margin is distinct with the outline attached to the wound base. There is large (67-100%) red granulation within the wound bed. There is a small (1-33%) amount of necrotic tissue within the wound bed including Adherent Slough. The periwound skin appearance exhibited: Scarring, Maceration. Periwound temperature was noted as No Abnormality. The periwound has tenderness on palpation. 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 There was a Skin/Subcutaneous Tissue Debridement ([id number]) debridement with total area of 13.3 sq cm performed by [name] Caul, MD. with the following instrument(s): Curette to remove Viable and Non-Viable tissue/material including Exudate, Fibrin/Slough, and Subcutaneous after achieving pain control using Lidocaine 4% [name], [name] ([id number]) Topical Solution. A time out was conducted at 08:27, prior to the start of the procedure. A Minimum  amount of bleeding was controlled with Pressure. The procedure was tolerated well with a pain level of 0 throughout and a pain level of 0 following the procedure. Post Debridement Measurements: 3.8cm length x 3.5cm width x 0.2cm depth; 2.089cm^3 volume. Character of Wound/Ulcer Post Debridement requires further debridement. Severity of Tissue Post Debridement is: Fat layer exposed. Post procedure Diagnosis Wound #1: Same as Pre-Procedure Plan Wound Cleansing: Wound #1 Right,Lateral Malleolus: Clean wound with Normal Saline. Cleanse wound with mild soap and water May Shower, gently pat wound dry prior to applying new dressing. Wound #2 Right,Medial Malleolus: Clean wound with Normal Saline. Cleanse wound with mild soap and water May Shower, gently pat wound dry prior to applying new dressing. Anesthetic: Wound #1 Right,Lateral Malleolus: Topical Lidocaine 4% cream applied to wound bed prior to debridement Wound #2 Right,Medial Malleolus: Topical Lidocaine 4% cream applied to wound bed prior to debridement Skin Barriers/Peri-Wound Care: Wound #1 Right,Lateral Malleolus: Barrier cream - zinc oxide Primary Wound Dressing: Wound #1 Right,Lateral Malleolus: Medihoney gel Wound #2 Right,Medial Malleolus: Medihoney gel Secondary Dressing: Wound #1 Right,Lateral Malleolus: ABD pad Dry Gauze Conform/Kerlix Drawtex Wound #2 Right,Medial Malleolus: ABD pad Dry Gauze Conform/Kerlix Drawtex Dressing Change Frequency: Wound #1 Right,Lateral Malleolus: Clair, Stark C. ([id number]) Change dressing every other day. Other: - PRN visit Friday Wound #2 Right,Medial Malleolus: Change dressing every other day. Other: - PRN visit Friday Follow-up Appointments: Wound #1 Right,Lateral Malleolus: Return Appointment in 1 week. Other: - nurse visit Friday Wound #2 Right,Medial Malleolus: Return Appointment in 1 week. Other: - nurse visit Friday Edema Control: Wound #1 Right,Lateral  Malleolus: Elevate legs to the level of the heart and pump ankles as often as possible Other: - wear own compression stockings Wound #2 Right,Medial Malleolus: Elevate legs to the level of the  heart and pump ankles as often as possible Other: - wear own compression stockings Additional Orders / Instructions: Wound #1 Right,Lateral Malleolus: Increase protein intake. OK to return to work with the following restrictions: Activity as tolerated Wound #2 Right,Medial Malleolus: Increase protein intake. OK to return to work with the following restrictions: Activity as tolerated #1 stalled wound with recurrent nonviable surface. We have reviewed his prior treatments. I'm going to try medihoney, keep santyl in reserve. #2 there is no evidence of infection however a deep tissue culture of this may be in order previously he has grown gram negatives Electronic Signature(s) Signed: 04/24/2017 7:49:24 AM By: [name] Najjar MD Entered By: [name] on 04/23/2017 08:37:56 [name] ([id number]) -------------------------------------------------------------------------------- SuperBill Details [name] Date of Service: 04/23/2017 Patient Name: C. Patient Account Number: [id number] Medical Record Treating RN: [name] [id number] Number: Other Clinician: Date of Birth/Sex: [birth year] (71 y.o. 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, initial T85.613A encounter Disruption of external operation  (surgical) wound, not elsewhere classified, subsequent T81.31XD encounter L97.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 of skin Facility Procedures CPT4: Description Modifier Quantity Code 78295621 11042 - DEB SUBQ TISSUE 20 SQ CM/< 1 ICD-10 Description Diagnosis L97.212 Non-pressure chronic ulcer of right calf with fat layer exposed I87.331 Chronic venous hypertension (idiopathic) with ulcer and  inflammation of right lower extremity Physician Procedures CPT4: Description Modifier Quantity Code [id number] 11042 - WC PHYS SUBQ TISS 20 SQ CM 1 ICD-10 Description Diagnosis L97.212 Non-pressure chronic ulcer of right calf with fat layer exposed I87.331 Chronic venous hypertension (idiopathic) with ulcer and  inflammation of right lower extremity Electronic Signature(s) MUSCAB, BRENNEMAN ([id number]) Signed: 04/24/2017 7:49:24 AM By: [name] Najjar MD Entered By: [name] on 04/23/2017 08:38:26

## 2017-04-25 NOTE — Progress Notes (Signed)
[name], [name] ([id number]) Visit Report for 04/23/2017 Arrival Information Details Patient Name: [name], [name]. Date of Service: 04/23/2017 8:00 AM Medical Record Number: [id number] Patient Account Number: [id number] Date of Birth/Sex: June 16, 1946 (71 y.o. Male) Treating RN: [name] Primary Care Neko Boyajian: [name] Other Clinician: Referring Liron Eissler: [name] Treating Rexanne Inocencio/Extender: Altamese [place] in Treatment: 66 Visit Information History Since Last Visit All ordered tests and consults were completed: No Patient Arrived: Ambulatory Added or deleted any medications: No Arrival Time: 08:07 Any new allergies or adverse reactions: No Accompanied By: self Had a fall or experienced change in No Transfer Assistance: None activities of daily living that may affect Patient Identification Verified: Yes risk of falls: Secondary Verification Process Yes Signs or symptoms of abuse/neglect since last No Completed: visito Patient Requires Transmission- No Hospitalized since last visit: No Based Precautions: Has Dressing in Place as Prescribed: Yes Patient Has Alerts: Yes Pain Present Now: No Patient Alerts: Patient on Blood Thinner Electronic Signature(s) Signed: 04/23/2017 4:37:15 PM By: Alejandro Mulling Entered By: Alejandro Mulling on 04/23/2017 08:08:49 [name] ([id number]) -------------------------------------------------------------------------------- Encounter Discharge Information Details Patient Name: [name] Date of Service: 04/23/2017 8:00 AM Medical Record Number: [id number] Patient Account Number: [id number] Date of Birth/Sex: 04-05-1946 (71 y.o. Male) Treating RN: [name] Primary Care Yizel Canby: [name] Other Clinician: Referring Maggie Senseney: [name] Treating Aliscia Clayton/Extender: [name] Weeks in Treatment: 52 Encounter Discharge Information Items Discharge Pain Level: 0 Discharge  Condition: Stable Ambulatory Status: Ambulatory Discharge Destination: Home Transportation: Private Auto Accompanied By: self Schedule Follow-up Appointment: Yes Medication Reconciliation completed No and provided to Patient/Care Cherylyn Sundby: Patient Clinical Summary of Care: Declined Electronic Signature(s) Signed: 04/24/2017 9:32:52 AM By: Gwenlyn Perking Entered By: Gwenlyn Perking on 04/23/2017 08:50:31 [name] ([id number]) -------------------------------------------------------------------------------- Lower Extremity Assessment Details Patient Name: [name] Date of Service: 04/23/2017 8:00 AM Medical Record Number: [id number] Patient Account Number: [id number] Date of Birth/Sex: [birth year] (71 y.o. Male) Treating RN: [name] Primary Care Matteo Banke: [name] Other Clinician: Referring Mariana Wiederholt: [name] Treating Jewell Ryans/Extender: [name] Weeks in Treatment: 66 Vascular Assessment Pulses: Dorsalis Pedis Palpable: [Right:Yes] Posterior Tibial Extremity colors, hair growth, and conditions: Extremity Color: [Right:Normal] Temperature of Extremity: [Right:Warm] Capillary Refill: [Right:< 3 seconds] Toe Nail Assessment Left: Right: Thick: Yes Discolored: Yes Deformed: No Improper Length and Hygiene: No Electronic Signature(s) Signed: 04/23/2017 4:37:15 PM By: Alejandro Mulling Entered By: Alejandro Mulling on 04/23/2017 08:18:29 [name] ([id number]) -------------------------------------------------------------------------------- Multi Wound Chart Details Patient Name: [name] Date of Service: 04/23/2017 8:00 AM Medical Record Number: [id number] Patient Account Number: [id number] Date of Birth/Sex: 1946/07/18 (71 y.o. Male) Treating RN: Ashok Cordia, [name] Primary Care Jamaury Gumz: [name] Other Clinician: Referring Mayo Faulk: [name] Treating Treyten Monestime/Extender: [name] Weeks in Treatment:  66 Vital Signs Height(in): 76 Pulse(bpm): 66 Weight(lbs): 238 Blood Pressure 140/60 (mmHg): Body Mass Index(BMI): 29 Temperature(F): 97.8 Respiratory Rate 18 (breaths/min): Photos: [1:No Photos] [2:No Photos] [N/A:N/A] Wound Location: [1:Right Malleolus - Lateral] [2:Right Malleolus - Medial] [N/A:N/A] Wounding Event: [1:Gradually Appeared] [2:Gradually Appeared] [N/A:N/A] Primary Etiology: [1:Venous Leg Ulcer] [2:Venous Leg Ulcer] [N/A:N/A] Comorbid History: [1:Cataracts, Chronic Obstructive Pulmonary Disease (COPD), Osteoarthritis] [2:Cataracts, Chronic Obstructive Pulmonary Disease (COPD), Osteoarthritis] [N/A:N/A] Date Acquired: [1:08/11/2015] [2:01/30/2016] [N/A:N/A] Weeks of Treatment: [1:66] [2:64] [N/A:N/A] Wound Status: [1:Open] [2:Open] [N/A:N/A] Clustered Wound: [1:No] [2:Yes] [N/A:N/A] Measurements L x W x D 3.8x3.5x0.2 [2:2.3x1.8x0.2] [N/A:N/A] (cm) Area (cm) : [1:10.446] [2:3.252] [N/A:N/A] Volume (cm) : [1:2.089] [2:0.65] [N/A:N/A] % Reduction in Area: [1:39.50%] [  2:71.80%] [N/A:N/A] % Reduction in Volume: 59.70% [2:43.70%] [N/A:N/A] Classification: [1:Full Thickness Without Exposed Support Structures] [2:Full Thickness Without Exposed Support Structures] [N/A:N/A] Exudate Amount: [1:Large] [2:Large] [N/A:N/A] Exudate Type: [1:Serosanguineous] [2:Serosanguineous] [N/A:N/A] Exudate Color: [1:red, brown] [2:red, brown] [N/A:N/A] Wound Margin: [1:Distinct, outline attached] [2:Distinct, outline attached] [N/A:N/A] Granulation Amount: [1:None Present (0%)] [2:Large (67-100%)] [N/A:N/A] Granulation Quality: [1:N/A] [2:Red] [N/A:N/A] Necrotic Amount: [1:Large (67-100%)] [2:Small (1-33%)] [N/A:N/A] Epithelialization: [1:Small (1-33%)] [2:Small (1-33%)] [N/A:N/A] Debridement: [2:N/A] [N/A:N/A] Debridement ([id number]) Pre-procedure 08:27 N/A N/A Verification/Time Out Taken: Pain Control: Lidocaine 4% Topical N/A N/A Solution Tissue Debrided: Fibrin/Slough,  Exudates, N/A N/A Subcutaneous Level: Skin/Subcutaneous N/A N/A Tissue Debridement Area (sq 13.3 N/A N/A cm): Instrument: Curette N/A N/A Bleeding: Minimum N/A N/A Hemostasis Achieved: Pressure N/A N/A Procedural Pain: 0 N/A N/A Post Procedural Pain: 0 N/A N/A Debridement Treatment Procedure was tolerated N/A N/A Response: well Post Debridement 3.8x3.5x0.2 N/A N/A Measurements L x W x D (cm) Post Debridement 2.089 N/A N/A Volume: (cm) Periwound Skin Texture: Scarring: Yes Scarring: Yes N/A Periwound Skin Maceration: Yes Maceration: Yes N/A Moisture: Periwound Skin Color: No Abnormalities Noted No Abnormalities Noted N/A Temperature: No Abnormality No Abnormality N/A Tenderness on Yes Yes N/A Palpation: Wound Preparation: Ulcer Cleansing: Ulcer Cleansing: N/A Rinsed/Irrigated with Rinsed/Irrigated with Saline Saline Topical Anesthetic Topical Anesthetic Applied: Other: lidocaine Applied: Other: lidocaine 4% 4% Procedures Performed: Debridement N/A N/A Treatment Notes Electronic Signature(s) Signed: 04/24/2017 7:49:24 AM By: [name] MD Entered By: [name] on 04/23/2017 08:32:10 [name] ([id number]) -------------------------------------------------------------------------------- Multi-Disciplinary Care Plan Details Patient Name: [name] Date of Service: 04/23/2017 8:00 AM Medical Record Number: [id number] Patient Account Number: [id number] Date of Birth/Sex: May 05, 1946 (71 y.o. Male) Treating RN: [name] Primary Care Kunaal Walkins: [name] Other Clinician: Referring Breyson Kelm: [name] Treating Jisel Fleet/Extender: [name] Weeks in Treatment: 66 Active Inactive ` Venous Leg Ulcer Nursing Diagnoses: Knowledge deficit related to disease process and management Goals: Patient will maintain optimal edema control Date Initiated: 04/03/2016 Target Resolution Date: 11/24/2016 Goal Status:  Active Interventions: Compression as ordered Treatment Activities: Therapeutic compression applied : 04/03/2016 Notes: ` Wound/Skin Impairment Nursing Diagnoses: Impaired tissue integrity Goals: Ulcer/skin breakdown will heal within 14 weeks Date Initiated: 01/17/2016 Target Resolution Date: 11/24/2016 Goal Status: Active Interventions: Assess ulceration(s) every visit Notes: Electronic Signature(s) Signed: 04/23/2017 4:37:15 PM By: Aurelio Jew, [name] ([id number]) Entered By: Alejandro Mulling on 04/23/2017 08:18:42 [name] ([id number]) -------------------------------------------------------------------------------- Pain Assessment Details Patient Name: [name] Date of Service: 04/23/2017 8:00 AM Medical Record Number: [id number] Patient Account Number: [id number] Date of Birth/Sex: 1946/08/14 (71 y.o. Male) Treating RN: [name] Primary Care Connell Bognar: [name] Other Clinician: Referring Keron Neenan: [name] Treating Kham Zuckerman/Extender: [name] Weeks in Treatment: 66 Active Problems Location of Pain Severity and Description of Pain Patient Has Paino No Site Locations With Dressing Change: No Pain Management and Medication Current Pain Management: Electronic Signature(s) Signed: 04/23/2017 4:37:15 PM By: Alejandro Mulling Entered By: Alejandro Mulling on 04/23/2017 08:08:56 [name] ([id number]) -------------------------------------------------------------------------------- Patient/Caregiver Education Details [name] Date of Service: 04/23/2017 8:00 AM Patient Name: C. Patient Account Number: [id number] Medical Record Treating RN: [name] [id number] Number: Other Clinician: Date of Birth/Gender: March 09, 1946 (71 y.o. Male) Treating [name], [name] Primary Care Physician: [name] Physician/Extender: G Referring Physician: [name] in Treatment: 24 Education  Assessment Education Provided To: Patient Education Topics Provided Wound/Skin Impairment: Handouts: Other: change dressing as ordered Methods: Demonstration, Explain/Verbal Responses: State content correctly Electronic Signature(s) Signed: 04/23/2017  4:37:15 PM By: Alejandro Mulling Entered By: Alejandro Mulling on 04/23/2017 08:20:51 [name] ([id number]) -------------------------------------------------------------------------------- Wound Assessment Details Patient Name: [name] Date of Service: 04/23/2017 8:00 AM Medical Record Number: [id number] Patient Account Number: [id number] Date of Birth/Sex: 06/05/1946 (71 y.o. Male) Treating RN: Ashok Cordia, [name] Primary Care Crystalle Popwell: [name] Other Clinician: Referring Tassie Pollett: [name] Treating Taressa Rauh/Extender: [name] Weeks in Treatment: 66 Wound Status Wound Number: 1 Primary Venous Leg Ulcer Etiology: Wound Location: Right Malleolus - Lateral Wound Open Wounding Event: Gradually Appeared Status: Date Acquired: 08/11/2015 Comorbid Cataracts, Chronic Obstructive Weeks Of Treatment: 66 History: Pulmonary Disease (COPD), Clustered Wound: No Osteoarthritis Photos Photo Uploaded By: Alejandro Mulling on 04/23/2017 09:24:26 Wound Measurements Length: (cm) 3.8 Width: (cm) 3.5 Depth: (cm) 0.2 Area: (cm) 10.446 Volume: (cm) 2.089 % Reduction in Area: 39.5% % Reduction in Volume: 59.7% Epithelialization: Small (1-33%) Tunneling: No Undermining: No Wound Description Full Thickness Without Exposed Foul Odor Afte Classification: Support Structures Slough/Fibrino Wound Margin: Distinct, outline attached Exudate Large Amount: Exudate Type: Serosanguineous Exudate Color: red, brown r Cleansing: No Yes Wound Bed Granulation Amount: None Present (0%) Necrotic Amount: Large (67-100%) [name], [name] ([id number]) Necrotic Quality: Adherent Slough Periwound Skin  Texture Texture Color No Abnormalities Noted: No No Abnormalities Noted: No Scarring: Yes Temperature / Pain Moisture Temperature: No Abnormality No Abnormalities Noted: No Tenderness on Palpation: Yes Maceration: Yes Wound Preparation Ulcer Cleansing: Rinsed/Irrigated with Saline Topical Anesthetic Applied: Other: lidocaine 4%, Treatment Notes Wound #1 (Right, Lateral Malleolus) 1. Cleansed with: Clean wound with Normal Saline 2. Anesthetic Topical Lidocaine 4% cream to wound bed prior to debridement 3. Peri-wound Care: Barrier cream 4. Dressing Applied: Medihoney Gel 5. Secondary Dressing Applied ABD Pad Dry Gauze Kerlix/Conform 7. Secured with Tape Notes drawtex Electronic Signature(s) Signed: 04/23/2017 4:37:15 PM By: Alejandro Mulling Entered By: Alejandro Mulling on 04/23/2017 08:15:53 [name] ([id number]) -------------------------------------------------------------------------------- Wound Assessment Details Patient Name: [name] Date of Service: 04/23/2017 8:00 AM Medical Record Number: [id number] Patient Account Number: [id number] Date of Birth/Sex: 1946/06/30 (71 y.o. Male) Treating RN: Ashok Cordia, [name] Primary Care Jabin Tapp: [name] Other Clinician: Referring Katty Fretwell: [name] Treating Xyler Terpening/Extender: [name] Weeks in Treatment: 66 Wound Status Wound Number: 2 Primary Venous Leg Ulcer Etiology: Wound Location: Right Malleolus - Medial Wound Open Wounding Event: Gradually Appeared Status: Date Acquired: 01/30/2016 Comorbid Cataracts, Chronic Obstructive Weeks Of Treatment: 64 History: Pulmonary Disease (COPD), Clustered Wound: Yes Osteoarthritis Photos Photo Uploaded By: Alejandro Mulling on 04/23/2017 09:24:26 Wound Measurements Length: (cm) 2.3 Width: (cm) 1.8 Depth: (cm) 0.2 Area: (cm) 3.252 Volume: (cm) 0.65 % Reduction in Area: 71.8% % Reduction in Volume: 43.7% Epithelialization: Small  (1-33%) Tunneling: No Undermining: No Wound Description Full Thickness Without Exposed Foul Odor Afte Classification: Support Structures Slough/Fibrino Wound Margin: Distinct, outline attached Exudate Large Amount: Exudate Type: Serosanguineous Exudate Color: red, brown r Cleansing: No Yes Wound Bed Granulation Amount: Large (67-100%) Granulation Quality: Red [name], [name] ([id number]) Necrotic Amount: Small (1-33%) Necrotic Quality: Adherent Slough Periwound Skin Texture Texture Color No Abnormalities Noted: No No Abnormalities Noted: No Scarring: Yes Temperature / Pain Moisture Temperature: No Abnormality No Abnormalities Noted: No Tenderness on Palpation: Yes Maceration: Yes Wound Preparation Ulcer Cleansing: Rinsed/Irrigated with Saline Topical Anesthetic Applied: Other: lidocaine 4%, Treatment Notes Wound #2 (Right, Medial Malleolus) 1. Cleansed with: Clean wound with Normal Saline 2. Anesthetic Topical Lidocaine 4% cream to wound bed prior to debridement 3. Peri-wound Care: Barrier cream 4. Dressing Applied: Medihoney Gel 5.  Secondary Dressing Applied ABD Pad Dry Gauze Kerlix/Conform 7. Secured with Tape Notes drawtex Electronic Signature(s) Signed: 04/23/2017 4:37:15 PM By: Alejandro MullingPinkerton, Debra Entered By: Alejandro MullingPinkerton, Debra on 04/23/2017 08:16:31 [name] StackBLACKWELL, [name] ([id number]) -------------------------------------------------------------------------------- Vitals Details Patient Name: [name] StackBLACKWELL, [name] Date of Service: 04/23/2017 8:00 AM Medical Record Number: [id number] Patient Account Number: [id number] Date of Birth/Sex: 11/13/1945 [age] y.o. Male) Treating RN: Ashok CordiaPinkerton, [name] Primary Care Zamzam Whinery: Darreld McleanMILES, LINDA Other Clinician: Referring Awilda Covin: Darreld McleanMILES, LINDA Treating Brannon Levene/Extender: Maxwell CaulOBSON, [name] G Weeks in Treatment: 66 Vital Signs Time Taken: 08:08 Temperature (F): 97.8 Height (in): 76 Pulse (bpm): 66 Weight (lbs):  238 Respiratory Rate (breaths/min): 18 Body Mass Index (BMI): 29 Blood Pressure (mmHg): 140/60 Reference Range: 80 - 120 mg / dl Electronic Signature(s) Signed: 04/23/2017 4:37:15 PM By: Alejandro MullingPinkerton, Debra Entered By: Alejandro MullingPinkerton, Debra on 04/23/2017 08:11:01

## 2017-04-30 ENCOUNTER — Encounter: Payer: Medicare HMO | Attending: Internal Medicine | Admitting: Internal Medicine

## 2017-04-30 DIAGNOSIS — Z86718 Personal history of other venous thrombosis and embolism: Secondary | ICD-10-CM | POA: Insufficient documentation

## 2017-04-30 DIAGNOSIS — T8131XD Disruption of external operation (surgical) wound, not elsewhere classified, subsequent encounter: Secondary | ICD-10-CM | POA: Diagnosis not present

## 2017-04-30 DIAGNOSIS — I87331 Chronic venous hypertension (idiopathic) with ulcer and inflammation of right lower extremity: Secondary | ICD-10-CM | POA: Diagnosis not present

## 2017-04-30 DIAGNOSIS — Y832 Surgical operation with anastomosis, bypass or graft as the cause of abnormal reaction of the patient, or of later complication, without mention of misadventure at the time of the procedure: Secondary | ICD-10-CM | POA: Diagnosis not present

## 2017-04-30 DIAGNOSIS — L97212 Non-pressure chronic ulcer of right calf with fat layer exposed: Secondary | ICD-10-CM | POA: Diagnosis not present

## 2017-04-30 DIAGNOSIS — L97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 of skin: Secondary | ICD-10-CM | POA: Diagnosis not present

## 2017-04-30 DIAGNOSIS — T85613A 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, initial encounter: Secondary | ICD-10-CM | POA: Insufficient documentation

## 2017-04-30 DIAGNOSIS — Z7901 Long term (current) use of anticoagulants: Secondary | ICD-10-CM | POA: Diagnosis not present

## 2017-05-01 NOTE — Progress Notes (Signed)
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 located on the Right,Lateral Malleolus. The wound measures 6cm length x 3cm width x 0.2cm depth; 14.137cm^2 area and 2.827cm^3 volume. There is Fat Layer (Subcutaneous Tissue) Exposed  exposed. There is no tunneling or undermining noted. There is a large amount of serosanguineous drainage noted. The wound margin is distinct with the outline attached to the wound base. There is small (1-33%) red granulation within the wound bed. There is a  large (67-100%) amount of necrotic tissue within the wound bed including Adherent Slough. The periwound skin appearance exhibited: Scarring, Maceration, Hemosiderin Staining. The periwound skin appearance did not exhibit: Callus, Crepitus, Excoriation, Induration, Rash, Dry/Scaly, Atrophie Blanche, Cyanosis, Ecchymosis, Mottled, Pallor, Rubor, Erythema. Periwound temperature was noted as No Abnormality. The periwound has tenderness on palpation. Wound #2 status is Open. Original cause of wound was Gradually Appeared. The wound is located on the Right,Medial Malleolus. The wound measures 2.5cm length x 2.5cm width x 0.2cm depth; 4.909cm^2 area and 0.982cm^3 volume. There is Fat Layer (Subcutaneous Tissue) Exposed exposed. There is no tunneling or undermining noted. There is a large amount of serosanguineous drainage noted. The wound margin is distinct with the outline attached to the wound base. There is small (1-33%) red granulation within the wound bed. There is a large (67-100%) amount of necrotic tissue within the wound bed including Adherent Slough. The periwound skin appearance exhibited: Scarring, Maceration, Hemosiderin Staining. The periwound skin appearance did not exhibit: Callus, Crepitus, Excoriation, Induration, Rash, Dry/Scaly, Atrophie Blanche, Cyanosis, Ecchymosis, Mottled, Pallor, Rubor, Erythema. Periwound temperature was noted as No Abnormality. The periwound has tenderness on palpation. 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 Wound #1 Pre-procedure diagnosis of Wound #1 is a Venous Leg Ulcer located on the Right,Lateral Malleolus .Severity of Tissue Pre Debridement is: Fat layer exposed. There was a Skin/Subcutaneous Tissue Debridement ([id number]) debridement with total area of 18 sq cm performed by [name] Caul, MD. with the following instrument(s): Curette to remove Viable and Non-Viable tissue/material including Exudate, Fibrin/Slough, and Subcutaneous after achieving pain control using Other (lidocaone 4%). A time out was conducted at 09:22, prior to the start of the procedure. A Large amount of bleeding was controlled with Pressure. The procedure was tolerated well with a pain level of 2 throughout and a pain level of 2 following the procedure. Post Debridement Measurements: 6cm length x 3cm width x 0.3cm depth; 4.241cm^3 volume. Character of Wound/Ulcer Post Debridement requires further debridement. Severity of Tissue Post Debridement is: Fat layer exposed. Post procedure Diagnosis Wound #1: Same as Pre-Procedure Plan Wound Cleansing: Wound #1 Right,Lateral Malleolus: Clean wound with Normal Saline. May Shower, gently pat wound dry prior to applying new dressing. Wound #2 Right,Medial Malleolus: Clean wound with Normal Saline. May Shower, gently pat wound dry prior to applying new dressing. Anesthetic: Wound #1 Right,Lateral Malleolus: Topical Lidocaine 4% cream applied to wound bed prior to debridement Wound #2 Right,Medial Malleolus: Topical Lidocaine 4% cream applied to wound bed prior to debridement Skin Barriers/Peri-Wound Care: Wound #1 Right,Lateral Malleolus: Barrier cream - zinc oxide Wound #2 Right,Medial Malleolus: Barrier cream - zinc oxide Primary Wound Dressing: Wound #1 Right,Lateral Malleolus: Medihoney gel Wound #2 Right,Medial  Malleolus: Medihoney gel Secondary Dressing: Wound #1 Right,Lateral Malleolus: ABD  pad Dry Gauze Conform/Kerlix Wound #2 Right,Medial Malleolus: [name], [name] ABD pad Dry Gauze Conform/Kerlix Dressing Change Frequency: Wound #1 Right,Lateral Malleolus: Change dressing every other day. Other: - PRN visit Friday Wound #2 Right,Medial Malleolus: Change dressing every other day. Other: - PRN visit Friday Follow-up Appointments: Wound #1 Right,Lateral Malleolus: Return Appointment in 1 week. Other: - nurse visit Friday Wound #2 Right,Medial Malleolus: Return Appointment in 1 week. Other: - nurse visit Friday Edema Control: Wound #1 Right,Lateral Malleolus: Elevate legs to the level of the heart and pump ankles as often as possible Other: - wear own compression stockings Wound #2 Right,Medial Malleolus: Elevate legs to the level of the heart and pump ankles as often as possible Other: - wear own compression stockings Additional Orders / Instructions: Wound #1 Right,Lateral Malleolus: Increase protein intake. OK to return to work with the following restrictions: Activity as tolerated Wound #2 Right,Medial Malleolus: Increase protein intake. OK to return to work with the following restrictions: Activity as tolerated #1 I'm going to continue with medical knee for the next week. kerramax AG as a primary dressing after that to help with the continued drainage. I am hopeful to have a better surface by that point Electronic Signature(s) Signed: 05/01/2017 4:30:21 AM By: [name] Najjar MD Entered By: [name] on 04/30/2017 09:47:22 [name] ([id number]) -------------------------------------------------------------------------------- SuperBill Details [name] Date of Service: 04/30/2017 Patient Name: C. Patient Account Number: [id number] Medical Record Treating RN: [name] [id number] Number: Other Clinician: Date of  Birth/Sex: 07/11/1946 (71 y.o. 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, initial T85.613A encounter Disruption of external operation (surgical) wound, not elsewhere classified, subsequent T81.31XD encounter L97.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 of skin Facility Procedures CPT4: Description Modifier Quantity Code [id number] 11042 - DEB SUBQ TISSUE 20 SQ CM/< 1 ICD-10 Description Diagnosis I87.331 Chronic venous hypertension (idiopathic) with ulcer and inflammation of right lower extremity L97.212 Non-pressure chronic ulcer  of right calf with fat layer exposed Physician Procedures CPT4: Description Modifier Quantity Code [id number] 11042 - WC PHYS SUBQ TISS 20 SQ CM 1 ICD-10 Description Diagnosis I87.331 Chronic venous hypertension (idiopathic) with ulcer and inflammation of right lower extremity L97.212 Non-pressure chronic ulcer of  right calf with fat layer exposed Electronic Signature(s) [name], BOMBERGER ([id number]) Signed: 05/01/2017 4:30:21 AM By: [name] Najjar MD Entered By: [name] on 04/30/2017 09:47:51

## 2017-05-01 NOTE — Progress Notes (Signed)
[name], [name] ([id number]) Visit Report for 04/30/2017 Arrival Information Details Patient Name: [name], [name]. Date of Service: 04/30/2017 8:45 AM Medical Record Number: [id number] Patient Account Number: [id number] Date of Birth/Sex: Oct 08, 1945 (71 y.o. Male) Treating RN: Huel Coventry Primary Care Farida Mcreynolds: [name] Other Clinician: Referring Brysten Reister: [name] Treating Hancel Ion/Extender: Altamese [place] in Treatment: 58 Visit Information History Since Last Visit Added or deleted any medications: No Patient Arrived: Ambulatory Any new allergies or adverse reactions: No Arrival Time: 08:49 Had a fall or experienced change in No Accompanied By: self activities of daily living that may affect Transfer Assistance: None risk of falls: Patient Identification Verified: Yes Signs or symptoms of abuse/neglect since last No Secondary Verification Process Yes visito Completed: Hospitalized since last visit: No Patient Requires Transmission- No Has Dressing in Place as Prescribed: Yes Based Precautions: Pain Present Now: No Patient Has Alerts: Yes Patient Alerts: Patient on Blood Thinner Electronic Signature(s) Signed: 04/30/2017 5:20:16 PM By: Elliot Gurney, BSN, RN, CWS, Kim RN, BSN Entered By: Elliot Gurney, BSN, RN, CWS, Kim on 04/30/2017 08:51:54 [name] ([id number]) -------------------------------------------------------------------------------- Encounter Discharge Information Details Patient Name: [name] Date of Service: 04/30/2017 8:45 AM Medical Record Number: [id number] Patient Account Number: [id number] Date of Birth/Sex: [birth year] (71 y.o. Male) Treating RN: [name] Primary Care Rashell Shambaugh: [name] Other Clinician: Referring Linder Prajapati: [name] Treating Kadrian Partch/Extender: Altamese [place] in Treatment: 78 Encounter Discharge Information Items Discharge Pain Level: 0 Discharge Condition: Stable Ambulatory Status:  Ambulatory Discharge Destination: Home Transportation: Private Auto Accompanied By: self Schedule Follow-up Appointment: Yes Medication Reconciliation completed Yes and provided to Patient/Care Lonzell Dorris: Patient Clinical Summary of Care: Declined Electronic Signature(s) Signed: 04/30/2017 5:20:16 PM By: Elliot Gurney, BSN, RN, CWS, Kim RN, BSN Entered By: Elliot Gurney, BSN, RN, CWS, Kim on 04/30/2017 09:43:40 [name] ([id number]) -------------------------------------------------------------------------------- Lower Extremity Assessment Details Patient Name: [name] Date of Service: 04/30/2017 8:45 AM Medical Record Number: [id number] Patient Account Number: [id number] Date of Birth/Sex: October 31, 1945 (71 y.o. Male) Treating RN: Huel Coventry Primary Care Averyana Pillars: [name] Other Clinician: Referring Claxton Levitz: [name] Treating Infinity Jeffords/Extender: [name] Weeks in Treatment: 46 Vascular Assessment Pulses: Dorsalis Pedis Palpable: [Right:Yes] Posterior Tibial Extremity colors, hair growth, and conditions: Extremity Color: [Right:Hyperpigmented] Hair Growth on Extremity: [Right:Yes] Temperature of Extremity: [Right:Warm] Capillary Refill: [Right:< 3 seconds] Toe Nail Assessment Left: Right: Thick: Yes Discolored: Yes Deformed: No Improper Length and Hygiene: No Electronic Signature(s) Signed: 04/30/2017 5:20:16 PM By: Elliot Gurney, BSN, RN, CWS, Kim RN, BSN Entered By: Elliot Gurney, BSN, RN, CWS, Kim on 04/30/2017 09:00:50 [name] ([id number]) -------------------------------------------------------------------------------- Multi Wound Chart Details Patient Name: [name] Date of Service: 04/30/2017 8:45 AM Medical Record Number: [id number] Patient Account Number: [id number] Date of Birth/Sex: 24-Dec-1945 (71 y.o. Male) Treating RN: Huel Coventry Primary Care Jessic Standifer: [name] Other Clinician: Referring Jayci Ellefson: [name] Treating  Oree Mirelez/Extender: [name] Weeks in Treatment: 67 Vital Signs Height(in): 76 Pulse(bpm): 77 Weight(lbs): 238 Blood Pressure 156/63 (mmHg): Body Mass Index(BMI): 29 Temperature(F): 98.2 Respiratory Rate 18 (breaths/min): Photos: [1:No Photos] [2:No Photos] [N/A:N/A] Wound Location: [1:Right Malleolus - Lateral] [2:Right Malleolus - Medial] [N/A:N/A] Wounding Event: [1:Gradually Appeared] [2:Gradually Appeared] [N/A:N/A] Primary Etiology: [1:Venous Leg Ulcer] [2:Venous Leg Ulcer] [N/A:N/A] Comorbid History: [1:Cataracts, Chronic Obstructive Pulmonary Disease (COPD), Osteoarthritis] [2:Cataracts, Chronic Obstructive Pulmonary Disease (COPD), Osteoarthritis] [N/A:N/A] Date Acquired: [1:08/11/2015] [2:01/30/2016] [N/A:N/A] Weeks of Treatment: [1:67] [2:65] [N/A:N/A] Wound Status: [1:Open] [2:Open] [N/A:N/A] Clustered Wound: [1:No] [2:Yes] [N/A:N/A] Measurements L x W x D  6x3x0.2 [2:2.5x2.5x0.2] [N/A:N/A] (cm) Area (cm) : [1:14.137] [2:4.909] [N/A:N/A] Volume (cm) : [1:2.827] [2:0.982] [N/A:N/A] % Reduction in Area: [1:18.20%] [2:57.50%] [N/A:N/A] % Reduction in Volume: 45.50% [2:15.00%] [N/A:N/A] Classification: [1:Full Thickness Without Exposed Support Structures] [2:Full Thickness Without Exposed Support Structures] [N/A:N/A] Exudate Amount: [1:Large] [2:Large] [N/A:N/A] Exudate Type: [1:Serosanguineous] [2:Serosanguineous] [N/A:N/A] Exudate Color: [1:red, brown] [2:red, brown] [N/A:N/A] Wound Margin: [1:Distinct, outline attached] [2:Distinct, outline attached] [N/A:N/A] Granulation Amount: [1:Small (1-33%)] [2:Small (1-33%)] [N/A:N/A] Granulation Quality: [1:Red] [2:Red] [N/A:N/A] Necrotic Amount: [1:Large (67-100%)] [2:Large (67-100%)] [N/A:N/A] Exposed Structures: [1:Fat Layer (Subcutaneous Tissue) Exposed: Yes] [2:Fat Layer (Subcutaneous Tissue) Exposed: Yes] [N/A:N/A] Fascia: No Fascia: No Tendon: No Tendon: No Muscle: No Muscle: No Joint: No Joint:  No Bone: No Bone: No Epithelialization: Small (1-33%) Small (1-33%) N/A Debridement: Debridement (40981- N/A N/A 19147) Pre-procedure 09:22 N/A N/A Verification/Time Out Taken: Pain Control: Other N/A N/A Tissue Debrided: Fibrin/Slough, Exudates, N/A N/A Subcutaneous Level: Skin/Subcutaneous N/A N/A Tissue Debridement Area (sq 18 N/A N/A cm): Instrument: Curette N/A N/A Bleeding: Large N/A N/A Hemostasis Achieved: Pressure N/A N/A Procedural Pain: 2 N/A N/A Post Procedural Pain: 2 N/A N/A Debridement Treatment Procedure was tolerated N/A N/A Response: well Post Debridement 6x3x0.3 N/A N/A Measurements L x W x D (cm) Post Debridement 4.241 N/A N/A Volume: (cm) Periwound Skin Texture: Scarring: Yes Scarring: Yes N/A Excoriation: No Excoriation: No Induration: No Induration: No Callus: No Callus: No Crepitus: No Crepitus: No Rash: No Rash: No Periwound Skin Maceration: Yes Maceration: Yes N/A Moisture: Dry/Scaly: No Dry/Scaly: No Periwound Skin Color: Hemosiderin Staining: Yes Hemosiderin Staining: Yes N/A Atrophie Blanche: No Atrophie Blanche: No Cyanosis: No Cyanosis: No Ecchymosis: No Ecchymosis: No Erythema: No Erythema: No Mottled: No Mottled: No Pallor: No Pallor: No Rubor: No Rubor: No Temperature: No Abnormality No Abnormality N/A Tenderness on Yes Yes N/A Palpation: Wound Preparation: N/A [name], [name] ([id number]) Ulcer Cleansing: Ulcer Cleansing: Rinsed/Irrigated with Rinsed/Irrigated with Saline Saline Topical Anesthetic Topical Anesthetic Applied: Other: lidocaine Applied: Other: lidocaine 4% 4% Procedures Performed: Debridement N/A N/A Treatment Notes Electronic Signature(s) Signed: 05/01/2017 4:30:21 AM By: [name] MD Entered By: [name] on 04/30/2017 09:41:50 [name] ([id number]) -------------------------------------------------------------------------------- Multi-Disciplinary Care Plan  Details Patient Name: [name] Date of Service: 04/30/2017 8:45 AM Medical Record Number: [id number] Patient Account Number: [id number] Date of Birth/Sex: 1946-01-23 (71 y.o. Male) Treating RN: Huel Coventry Primary Care July Nickson: [name] Other Clinician: Referring Tarvaris Puglia: [name] Treating Zayvier Caravello/Extender: [name] Weeks in Treatment: 80 Active Inactive ` Venous Leg Ulcer Nursing Diagnoses: Knowledge deficit related to disease process and management Goals: Patient will maintain optimal edema control Date Initiated: 04/03/2016 Target Resolution Date: 11/24/2016 Goal Status: Active Interventions: Compression as ordered Treatment Activities: Therapeutic compression applied : 04/03/2016 Notes: ` Wound/Skin Impairment Nursing Diagnoses: Impaired tissue integrity Goals: Ulcer/skin breakdown will heal within 14 weeks Date Initiated: 01/17/2016 Target Resolution Date: 11/24/2016 Goal Status: Active Interventions: Assess ulceration(s) every visit Notes: Electronic Signature(s) Signed: 04/30/2017 5:20:16 PM By: Elliot Gurney, BSN, RN, CWS, Kim RN, BSN [address] [name] ([id number]) Entered By: Elliot Gurney, BSN, RN, CWS, Kim on 04/30/2017 09:20:28 [name] ([id number]) -------------------------------------------------------------------------------- Pain Assessment Details Patient Name: [name] Date of Service: 04/30/2017 8:45 AM Medical Record Number: [id number] Patient Account Number: [id number] Date of Birth/Sex: [birth year] (71 y.o. Male) Treating RN: Huel Coventry Primary Care Leslee Suire: [name] Other Clinician: Referring Dalina Samara: [name] Treating Metha Kolasa/Extender: [name] Weeks in Treatment: 63 Active Problems Location of Pain Severity and Description of Pain Patient Has  Paino No Site Locations With Dressing Change: No Pain Management and Medication Current Pain Management: Goals for Pain Management Topical or  injectable lidocaine is offered to patient for acute pain when surgical debridement is performed. If needed, Patient is instructed to use over the counter pain medication for the following 24-48 hours after debridement. Wound care MDs do not prescribed pain medications. Patient has chronic pain or uncontrolled pain. Patient has been instructed to make an appointment with their Primary Care Physician for pain management. Electronic Signature(s) Signed: 04/30/2017 5:20:16 PM By: Elliot Gurney, BSN, RN, CWS, Kim RN, BSN Entered By: Elliot Gurney, BSN, RN, CWS, Kim on 04/30/2017 08:52:15 [name] ([id number]) -------------------------------------------------------------------------------- Patient/Caregiver Education Details [name] Date of Service: 04/30/2017 8:45 AM Patient Name: C. Patient Account Number: [id number] Medical Record Treating RN: Huel Coventry [id number] Number: Other Clinician: Date of Birth/Gender: Sep 25, 1945 (71 y.o. Male) Treating [name] Primary Care Physician: [name] Physician/Extender: G Referring Physician: [name] in Treatment: 79 Education Assessment Education Provided To: Patient Education Topics Provided Wound/Skin Impairment: Handouts: Caring for Your Ulcer, Other: continue daily wound care as prescribed Methods: Demonstration, Explain/Verbal Responses: State content correctly Electronic Signature(s) Signed: 04/30/2017 5:20:16 PM By: Elliot Gurney, BSN, RN, CWS, Kim RN, BSN Entered By: Elliot Gurney, BSN, RN, CWS, Kim on 04/30/2017 09:44:09 [name] ([id number]) -------------------------------------------------------------------------------- Wound Assessment Details Patient Name: [name] Date of Service: 04/30/2017 8:45 AM Medical Record Number: [id number] Patient Account Number: [id number] Date of Birth/Sex: Jan 22, 1946 (71 y.o. Male) Treating RN: Huel Coventry Primary Care Jakara Blatter: [name] Other Clinician: Referring  Delonda Coley: [name] Treating Kelilah Hebard/Extender: [name] Weeks in Treatment: 67 Wound Status Wound Number: 1 Primary Venous Leg Ulcer Etiology: Wound Location: Right Malleolus - Lateral Wound Open Wounding Event: Gradually Appeared Status: Date Acquired: 08/11/2015 Comorbid Cataracts, Chronic Obstructive Weeks Of Treatment: 67 History: Pulmonary Disease (COPD), Clustered Wound: No Osteoarthritis Photos Photo Uploaded By: Elliot Gurney, BSN, RN, CWS, Kim on 04/30/2017 09:49:03 Wound Measurements Length: (cm) 6 Width: (cm) 3 Depth: (cm) 0.2 Area: (cm) 14.137 Volume: (cm) 2.827 % Reduction in Area: 18.2% % Reduction in Volume: 45.5% Epithelialization: Small (1-33%) Tunneling: No Undermining: No Wound Description Full Thickness Without Exposed Foul Odor Afte Classification: Support Structures Slough/Fibrino Wound Margin: Distinct, outline attached Exudate Large Amount: Exudate Type: Serosanguineous Exudate Color: red, brown r Cleansing: No Yes Wound Bed Granulation Amount: Small (1-33%) Exposed Structure Granulation Quality: Red Fascia Exposed: No Ricklefs, Deja C. ([id number]) Necrotic Amount: Large (67-100%) Fat Layer (Subcutaneous Tissue) Exposed: Yes Necrotic Quality: Adherent Slough Tendon Exposed: No Muscle Exposed: No Joint Exposed: No Bone Exposed: No Periwound Skin Texture Texture Color No Abnormalities Noted: No No Abnormalities Noted: No Callus: No Atrophie Blanche: No Crepitus: No Cyanosis: No Excoriation: No Ecchymosis: No Induration: No Erythema: No Rash: No Hemosiderin Staining: Yes Scarring: Yes Mottled: No Pallor: No Moisture Rubor: No No Abnormalities Noted: No Dry / Scaly: No Temperature / Pain Maceration: Yes Temperature: No Abnormality Tenderness on Palpation: Yes Wound Preparation Ulcer Cleansing: Rinsed/Irrigated with Saline Topical Anesthetic Applied: Other: lidocaine 4%, Treatment Notes Wound #1 (Right,  Lateral Malleolus) 1. Cleansed with: Clean wound with Normal Saline 2. Anesthetic Topical Lidocaine 4% cream to wound bed prior to debridement 4. Dressing Applied: Medihoney Gel 5. Secondary Dressing Applied ABD Pad Notes xsorb and kerlix Electronic Signature(s) Signed: 04/30/2017 5:20:16 PM By: Elliot Gurney, BSN, RN, CWS, Kim RN, BSN Entered By: Elliot Gurney, BSN, RN, CWS, Kim on 04/30/2017 08:58:35 [name] ([id number]) -------------------------------------------------------------------------------- Wound Assessment Details Patient Name: [name],  Hendrick C. Date of Service: 04/30/2017 8:45 AM Medical Record Number: [id number] Patient Account Number: [id number] Date of Birth/Sex: 1946/02/19 34(71 y.o. Male) Treating RN: Huel CoventryWoody, Kim Primary Care Horst Ostermiller: Darreld McleanMILES, LINDA Other Clinician: Referring Mauro Arps: Darreld McleanMILES, LINDA Treating Mireille Lacombe/Extender: Maxwell CaulOBSON, [name] G Weeks in Treatment: 67 Wound Status Wound Number: 2 Primary Venous Leg Ulcer Etiology: Wound Location: Right Malleolus - Medial Wound Open Wounding Event: Gradually Appeared Status: Date Acquired: 01/30/2016 Comorbid Cataracts, Chronic Obstructive Weeks Of Treatment: 65 History: Pulmonary Disease (COPD), Clustered Wound: Yes Osteoarthritis Photos Photo Uploaded By: Elliot GurneyWoody, BSN, RN, CWS, Kim on 04/30/2017 09:49:04 Wound Measurements Length: (cm) 2.5 Width: (cm) 2.5 Depth: (cm) 0.2 Area: (cm) 4.909 Volume: (cm) 0.982 % Reduction in Area: 57.5% % Reduction in Volume: 15% Epithelialization: Small (1-33%) Tunneling: No Undermining: No Wound Description Full Thickness Without Exposed Foul Odor Afte Classification: Support Structures Slough/Fibrino Wound Margin: Distinct, outline attached Exudate Large Amount: Exudate Type: Serosanguineous Exudate Color: red, brown r Cleansing: No Yes Wound Bed Granulation Amount: Small (1-33%) Exposed Structure Granulation Quality: Red Fascia Exposed: No [name], [name]  C. ([id number]) Necrotic Amount: Large (67-100%) Fat Layer (Subcutaneous Tissue) Exposed: Yes Necrotic Quality: Adherent Slough Tendon Exposed: No Muscle Exposed: No Joint Exposed: No Bone Exposed: No Periwound Skin Texture Texture Color No Abnormalities Noted: No No Abnormalities Noted: No Callus: No Atrophie Blanche: No Crepitus: No Cyanosis: No Excoriation: No Ecchymosis: No Induration: No Erythema: No Rash: No Hemosiderin Staining: Yes Scarring: Yes Mottled: No Pallor: No Moisture Rubor: No No Abnormalities Noted: No Dry / Scaly: No Temperature / Pain Maceration: Yes Temperature: No Abnormality Tenderness on Palpation: Yes Wound Preparation Ulcer Cleansing: Rinsed/Irrigated with Saline Topical Anesthetic Applied: Other: lidocaine 4%, Treatment Notes Wound #2 (Right, Medial Malleolus) 1. Cleansed with: Clean wound with Normal Saline 2. Anesthetic Topical Lidocaine 4% cream to wound bed prior to debridement 4. Dressing Applied: Medihoney Gel 5. Secondary Dressing Applied ABD Pad Notes xsorb and kerlix Electronic Signature(s) Signed: 04/30/2017 5:20:16 PM By: Elliot GurneyWoody, BSN, RN, CWS, Kim RN, BSN Entered By: Elliot GurneyWoody, BSN, RN, CWS, Kim on 04/30/2017 08:59:25 [name], [name] ([id number]) -------------------------------------------------------------------------------- Vitals Details Patient Name: [name], [name] Date of Service: 04/30/2017 8:45 AM Medical Record Number: [id number] Patient Account Number: [id number] Date of Birth/Sex: 1946/02/19 47(71 y.o. Male) Treating RN: Huel CoventryWoody, Kim Primary Care Andrian Urbach: Darreld McleanMILES, LINDA Other Clinician: Referring Jennyfer Nickolson: Darreld McleanMILES, LINDA Treating Mustafa Potts/Extender: Maxwell CaulOBSON, [name] G Weeks in Treatment: 3767 Vital Signs Time Taken: 08:52 Temperature (F): 98.2 Height (in): 76 Pulse (bpm): 77 Weight (lbs): 238 Respiratory Rate (breaths/min): 18 Body Mass Index (BMI): 29 Blood Pressure (mmHg): 156/63 Reference Range: 80 - 120  mg / dl Electronic Signature(s) Signed: 04/30/2017 5:20:16 PM By: Elliot GurneyWoody, BSN, RN, CWS, Kim RN, BSN Entered By: Elliot GurneyWoody, BSN, RN, CWS, Kim on 04/30/2017 08:52:55

## 2017-05-07 ENCOUNTER — Other Ambulatory Visit
Admission: RE | Admit: 2017-05-07 | Discharge: 2017-05-07 | Disposition: A | Discharge status: Home / Self Care | Payer: Medicare HMO | Source: Ambulatory Visit | Attending: Internal Medicine | Admitting: Internal Medicine

## 2017-05-07 ENCOUNTER — Encounter: Payer: Medicare HMO | Admitting: Internal Medicine

## 2017-05-07 ENCOUNTER — Ambulatory Visit
Admission: RE | Admit: 2017-05-07 | Discharge: 2017-05-07 | Disposition: A | Discharge status: Home / Self Care | Payer: Medicare HMO | Source: Ambulatory Visit | Attending: Internal Medicine | Admitting: Internal Medicine

## 2017-05-07 ENCOUNTER — Other Ambulatory Visit: Payer: Self-pay | Admitting: Internal Medicine

## 2017-05-07 DIAGNOSIS — M7989 Other specified soft tissue disorders: Secondary | ICD-10-CM | POA: Diagnosis not present

## 2017-05-07 DIAGNOSIS — M25571 Pain in right ankle and joints of right foot: Secondary | ICD-10-CM | POA: Insufficient documentation

## 2017-05-07 DIAGNOSIS — R52 Pain, unspecified: Secondary | ICD-10-CM

## 2017-05-07 DIAGNOSIS — I87331 Chronic venous hypertension (idiopathic) with ulcer and inflammation of right lower extremity: Secondary | ICD-10-CM | POA: Diagnosis not present

## 2017-05-07 DIAGNOSIS — I739 Peripheral vascular disease, unspecified: Secondary | ICD-10-CM | POA: Diagnosis not present

## 2017-05-09 NOTE — Progress Notes (Signed)
[name], [name] ([id number]) Visit Report for 05/07/2017 Arrival Information Details Patient Name: [name], [name]. Date of Service: 05/07/2017 8:00 AM Medical Record Number: [id number] Patient Account Number: [id number] Date of Birth/Sex: 10-13-1945 (71 y.o. Male) Treating RN: [name] Primary Care Juda Lajeunesse: [name] Other Clinician: Referring Belem Hintze: [name] Treating Oakley Kossman/Extender: Altamese Broxton in Treatment: 68 Visit Information History Since Last Visit All ordered tests and consults were completed: No Patient Arrived: Ambulatory Added or deleted any medications: No Arrival Time: 08:00 Any new allergies or adverse reactions: No Accompanied By: self Had a fall or experienced change in No Transfer Assistance: None activities of daily living that may affect Patient Identification Verified: Yes risk of falls: Secondary Verification Process Yes Signs or symptoms of abuse/neglect since last No Completed: visito Patient Requires Transmission- No Hospitalized since last visit: No Based Precautions: Has Dressing in Place as Prescribed: Yes Patient Has Alerts: Yes Pain Present Now: Yes Patient Alerts: Patient on Blood Thinner Electronic Signature(s) Signed: 05/08/2017 4:54:17 PM By: Alejandro Mulling Entered By: Alejandro Mulling on 05/07/2017 08:03:49 [name] ([id number]) -------------------------------------------------------------------------------- Encounter Discharge Information Details Patient Name: [name] Date of Service: 05/07/2017 8:00 AM Medical Record Number: [id number] Patient Account Number: [id number] Date of Birth/Sex: 1946-01-17 (71 y.o. Male) Treating RN: [name] Primary Care Analleli Gierke: [name] Other Clinician: Referring Roby Spalla: [name] Treating Aarian Griffie/Extender: [name] Weeks in Treatment: 44 Encounter Discharge Information Items Discharge Pain Level: 8 Discharge  Condition: Stable Ambulatory Status: Ambulatory Discharge Destination: Home Private Transportation: Auto Accompanied By: self Schedule Follow-up Appointment: Yes Medication Reconciliation completed and No provided to Patient/Care Duru Reiger: Clinical Summary of Care: Electronic Signature(s) Signed: 05/08/2017 4:54:17 PM By: Alejandro Mulling Entered By: Alejandro Mulling on 05/07/2017 08:59:47 [name] ([id number]) -------------------------------------------------------------------------------- Lower Extremity Assessment Details Patient Name: [name] Date of Service: 05/07/2017 8:00 AM Medical Record Number: [id number] Patient Account Number: [id number] Date of Birth/Sex: [birth year] (71 y.o. Male) Treating RN: [name] Primary Care Nikolus Marczak: [name] Other Clinician: Referring Rion Schnitzer: [name] Treating Honour Schwieger/Extender: [name] Weeks in Treatment: 68 Vascular Assessment Pulses: Dorsalis Pedis Palpable: [Right:Yes] Posterior Tibial Extremity colors, hair growth, and conditions: Extremity Color: [Right:Normal] Temperature of Extremity: [Right:Warm] Capillary Refill: [Right:< 3 seconds] Toe Nail Assessment Left: Right: Thick: Yes Discolored: Yes Deformed: No Improper Length and Hygiene: Yes Electronic Signature(s) Signed: 05/08/2017 4:54:17 PM By: Alejandro Mulling Entered By: Alejandro Mulling on 05/07/2017 08:15:07 [name] ([id number]) -------------------------------------------------------------------------------- Multi Wound Chart Details Patient Name: [name] Date of Service: 05/07/2017 8:00 AM Medical Record Number: [id number] Patient Account Number: [id number] Date of Birth/Sex: 1946/06/26 (71 y.o. Male) Treating RN: Ashok Cordia, [name] Primary Care Yona Stansbury: [name] Other Clinician: Referring Brandell Maready: [name] Treating Canna Nickelson/Extender: [name] Weeks in Treatment: 68 Vital  Signs Height(in): 76 Pulse(bpm): 75 Weight(lbs): 238 Blood Pressure 127/62 (mmHg): Body Mass Index(BMI): 29 Temperature(F): 98.0 Respiratory Rate 18 (breaths/min): Photos: [1:No Photos] [2:No Photos] [N/A:N/A] Wound Location: [1:Right Malleolus - Lateral] [2:Right Malleolus - Medial] [N/A:N/A] Wounding Event: [1:Gradually Appeared] [2:Gradually Appeared] [N/A:N/A] Primary Etiology: [1:Venous Leg Ulcer] [2:Venous Leg Ulcer] [N/A:N/A] Comorbid History: [1:Cataracts, Chronic Obstructive Pulmonary Disease (COPD), Osteoarthritis] [2:Cataracts, Chronic Obstructive Pulmonary Disease (COPD), Osteoarthritis] [N/A:N/A] Date Acquired: [1:08/11/2015] [2:01/30/2016] [N/A:N/A] Weeks of Treatment: [1:68] [2:66] [N/A:N/A] Wound Status: [1:Open] [2:Open] [N/A:N/A] Clustered Wound: [1:No] [2:Yes] [N/A:N/A] Measurements L x W x D 4.8x3.8x0.2 [2:2.8x2.7x0.2] [N/A:N/A] (cm) Area (cm) : [1:14.326] [2:5.938] [N/A:N/A] Volume (cm) : [1:2.865] [2:1.188] [N/A:N/A] % Reduction in Area: [1:17.10%] [2:48.60%] [N/A:N/A] %  Reduction in Volume: 44.70% [2:-2.90%] [N/A:N/A] Classification: [1:Full Thickness Without Exposed Support Structures] [2:Full Thickness Without Exposed Support Structures] [N/A:N/A] Exudate Amount: [1:Large] [2:Large] [N/A:N/A] Exudate Type: [1:Serous] [2:Serous] [N/A:N/A] Exudate Color: [1:amber] [2:amber] [N/A:N/A] Wound Margin: [1:Distinct, outline attached] [2:Distinct, outline attached] [N/A:N/A] Granulation Amount: [1:None Present (0%)] [2:None Present (0%)] [N/A:N/A] Necrotic Amount: [1:Large (67-100%)] [2:Large (67-100%)] [N/A:N/A] Exposed Structures: [1:Fat Layer (Subcutaneous Tissue) Exposed: Yes Fascia: No] [2:Fat Layer (Subcutaneous Tissue) Exposed: Yes Fascia: No] [N/A:N/A] Tendon: No Tendon: No Muscle: No Muscle: No Joint: No Joint: No Bone: No Bone: No Epithelialization: Small (1-33%) Small (1-33%) N/A Periwound Skin Texture: Scarring: Yes Scarring: Yes  N/A Excoriation: No Excoriation: No Induration: No Induration: No Callus: No Callus: No Crepitus: No Crepitus: No Rash: No Rash: No Periwound Skin Maceration: Yes Maceration: Yes N/A Moisture: Dry/Scaly: No Dry/Scaly: No Periwound Skin Color: Hemosiderin Staining: Yes Hemosiderin Staining: Yes N/A Atrophie Blanche: No Atrophie Blanche: No Cyanosis: No Cyanosis: No Ecchymosis: No Ecchymosis: No Erythema: No Erythema: No Mottled: No Mottled: No Pallor: No Pallor: No Rubor: No Rubor: No Temperature: No Abnormality No Abnormality N/A Tenderness on Yes Yes N/A Palpation: Wound Preparation: Ulcer Cleansing: Ulcer Cleansing: N/A Rinsed/Irrigated with Rinsed/Irrigated with Saline Saline Topical Anesthetic Topical Anesthetic Applied: Other: lidocaine Applied: Other: lidocaine 4% 4% Treatment Notes Electronic Signature(s) Signed: 05/08/2017 4:54:17 PM By: Alejandro MullingPinkerton, Debra Entered By: Alejandro MullingPinkerton, Debra on 05/07/2017 08:15:30 [name] StackBLACKWELL, Kasten C. ([id number]) -------------------------------------------------------------------------------- Multi-Disciplinary Care Plan Details Patient Name: [name] StackBLACKWELL, Romaine C. Date of Service: 05/07/2017 8:00 AM Medical Record Number: [id number] Patient Account Number: [id number] Date of Birth/Sex: 05/28/1946 [age] y.o. Male) Treating RN: Phillis HaggisPinkerton, [name] Primary Care Leon Goodnow: Darreld McleanMILES, LINDA Other Clinician: Referring Bueford Arp: Darreld McleanMILES, LINDA Treating Anjelina Dung/Extender: Maxwell CaulOBSON, [name] G Weeks in Treatment: 68 Active Inactive ` Venous Leg Ulcer Nursing Diagnoses: Knowledge deficit related to disease process and management Goals: Patient will maintain optimal edema control Date Initiated: 04/03/2016 Target Resolution Date: 11/24/2016 Goal Status: Active Interventions: Compression as ordered Treatment Activities: Therapeutic compression applied : 04/03/2016 Notes: ` Wound/Skin Impairment Nursing Diagnoses: Impaired tissue  integrity Goals: Ulcer/skin breakdown will heal within 14 weeks Date Initiated: 01/17/2016 Target Resolution Date: 11/24/2016 Goal Status: Active Interventions: Assess ulceration(s) every visit Notes: Electronic Signature(s) Signed: 05/08/2017 4:54:17 PM By: Aurelio JewPinkerton, Debra Lalor, [name] ([id number]) Entered By: Alejandro MullingPinkerton, Debra on 05/07/2017 08:15:22 [name] StackBLACKWELL, Rolondo C. ([id number]) -------------------------------------------------------------------------------- Pain Assessment Details Patient Name: [name] StackBLACKWELL, Pankaj C. Date of Service: 05/07/2017 8:00 AM Medical Record Number: [id number] Patient Account Number: [id number] Date of Birth/Sex: 05/28/1946 [age] y.o. Male) Treating RN: Phillis HaggisPinkerton, [name] Primary Care Cerinity Zynda: Darreld McleanMILES, LINDA Other Clinician: Referring Chasady Longwell: Darreld McleanMILES, LINDA Treating Celina Shiley/Extender: Maxwell CaulOBSON, [name] G Weeks in Treatment: 68 Active Problems Location of Pain Severity and Description of Pain Patient Has Paino Yes Site Locations Pain Location: Pain in Ulcers Rate the pain. Current Pain Level: 10 Character of Pain Describe the Pain: Tender, Throbbing Pain Management and Medication Current Pain Management: Electronic Signature(s) Signed: 05/08/2017 4:54:17 PM By: Alejandro MullingPinkerton, Debra Entered By: Alejandro MullingPinkerton, Debra on 05/07/2017 08:04:11 [name] StackBLACKWELL, Kaleb C. ([id number]) -------------------------------------------------------------------------------- Patient/Caregiver Education Details [name], Aadith Date of Service: 05/07/2017 8:00 AM Patient Name: C. Patient Account Number: [id number] Medical Record Treating RN: Phillis Haggisinkerton, [name] [id number] Number: Other Clinician: Date of Birth/Gender: 05/28/1946 [age] y.o. Male) Treating [name], [name] Primary Care Physician: Darreld McleanMILES, LINDA Physician/Extender: G Referring Physician: Mickey FarberMILES, LINDA Weeks in Treatment: 2268 Education Assessment Education Provided To: Patient Education Topics Provided Wound/Skin  Impairment: Handouts: Other: change dressing as ordered Methods: Demonstration, Explain/Verbal Responses: State content correctly Electronic Signature(s) Signed: 05/08/2017 4:54:17 PM  By: Alejandro Mulling Entered By: Alejandro Mulling on 05/07/2017 08:59:59 [name] ([id number]) -------------------------------------------------------------------------------- Wound Assessment Details Patient Name: [name] Date of Service: 05/07/2017 8:00 AM Medical Record Number: [id number] Patient Account Number: [id number] Date of Birth/Sex: Dec 10, 1945 (71 y.o. Male) Treating RN: Ashok Cordia, [name] Primary Care Terriah Reggio: [name] Other Clinician: Referring Jamerion Cabello: [name] Treating [name] [name]/Extender: [name] Weeks in Treatment: 68 Wound Status Wound Number: 1 Primary Venous Leg Ulcer Etiology: Wound Location: Right Malleolus - Lateral Wound Open Wounding Event: Gradually Appeared Status: Date Acquired: 08/11/2015 Comorbid Cataracts, Chronic Obstructive Weeks Of Treatment: 68 History: Pulmonary Disease (COPD), Clustered Wound: No Osteoarthritis Photos Photo Uploaded By: Elliot Gurney, BSN, RN, CWS, Kim on 05/07/2017 15:56:30 Wound Measurements Length: (cm) 4.8 Width: (cm) 3.8 Depth: (cm) 0.2 Area: (cm) 14.326 Volume: (cm) 2.865 % Reduction in Area: 17.1% % Reduction in Volume: 44.7% Epithelialization: Small (1-33%) Tunneling: No Undermining: No Wound Description Full Thickness Without Exposed Foul Odor After Classification: Support Structures Slough/Fibrino Wound Margin: Distinct, outline attached Exudate Large Amount: Exudate Type: Serous Exudate Color: amber Cleansing: No Yes Wound Bed Granulation Amount: None Present (0%) Exposed Structure Necrotic Amount: Large (67-100%) Fascia Exposed: No Gathright, Tyrion C. ([id number]) Necrotic Quality: Adherent Slough Fat Layer (Subcutaneous Tissue) Exposed: Yes Tendon Exposed: No Muscle  Exposed: No Joint Exposed: No Bone Exposed: No Periwound Skin Texture Texture Color No Abnormalities Noted: No No Abnormalities Noted: No Callus: No Atrophie Blanche: No Crepitus: No Cyanosis: No Excoriation: No Ecchymosis: No Induration: No Erythema: No Rash: No Hemosiderin Staining: Yes Scarring: Yes Mottled: No Pallor: No Moisture Rubor: No No Abnormalities Noted: No Dry / Scaly: No Temperature / Pain Maceration: Yes Temperature: No Abnormality Tenderness on Palpation: Yes Wound Preparation Ulcer Cleansing: Rinsed/Irrigated with Saline Topical Anesthetic Applied: Other: lidocaine 4%, Treatment Notes Wound #1 (Right, Lateral Malleolus) 1. Cleansed with: Clean wound with Normal Saline 2. Anesthetic Topical Lidocaine 4% cream to wound bed prior to debridement 4. Dressing Applied: Hydrafera Blue 5. Secondary Dressing Applied Dry Gauze Kerlix/Conform 7. Secured with Secretary/administrator) Signed: 05/08/2017 4:54:17 PM By: Alejandro Mulling Entered By: Alejandro Mulling on 05/07/2017 08:13:15 [name] ([id number]) -------------------------------------------------------------------------------- Wound Assessment Details Patient Name: [name] Date of Service: 05/07/2017 8:00 AM Medical Record Number: [id number] Patient Account Number: [id number] Date of Birth/Sex: 1946/02/13 (71 y.o. Male) Treating RN: Ashok Cordia, [name] Primary Care Louan Base: [name] Other Clinician: Referring Carsyn Boster: [name] Treating Madisan Bice/Extender: [name] Weeks in Treatment: 68 Wound Status Wound Number: 2 Primary Venous Leg Ulcer Etiology: Wound Location: Right Malleolus - Medial Wound Open Wounding Event: Gradually Appeared Status: Date Acquired: 01/30/2016 Comorbid Cataracts, Chronic Obstructive Weeks Of Treatment: 66 History: Pulmonary Disease (COPD), Clustered Wound: Yes Osteoarthritis Photos Photo Uploaded By: Elliot Gurney, BSN, RN,  CWS, Kim on 05/07/2017 15:56:31 Wound Measurements Length: (cm) 2.8 Width: (cm) 2.7 Depth: (cm) 0.2 Area: (cm) 5.938 Volume: (cm) 1.188 % Reduction in Area: 48.6% % Reduction in Volume: -2.9% Epithelialization: Small (1-33%) Tunneling: No Undermining: No Wound Description Full Thickness Without Exposed Foul Odor After Classification: Support Structures Slough/Fibrino Wound Margin: Distinct, outline attached Exudate Large Amount: Exudate Type: Serous Exudate Color: amber Cleansing: No Yes Wound Bed Granulation Amount: None Present (0%) Exposed Structure Necrotic Amount: Large (67-100%) Fascia Exposed: No Kott, Rama C. ([id number]) Necrotic Quality: Adherent Slough Fat Layer (Subcutaneous Tissue) Exposed: Yes Tendon Exposed: No Muscle Exposed: No Joint Exposed: No Bone Exposed: No Periwound Skin Texture Texture Color No Abnormalities Noted: No No Abnormalities Noted: No Callus: No Atrophie  Blanche: No Crepitus: No Cyanosis: No Excoriation: No Ecchymosis: No Induration: No Erythema: No Rash: No Hemosiderin Staining: Yes Scarring: Yes Mottled: No Pallor: No Moisture Rubor: No No Abnormalities Noted: No Dry / Scaly: No Temperature / Pain Maceration: Yes Temperature: No Abnormality Tenderness on Palpation: Yes Wound Preparation Ulcer Cleansing: Rinsed/Irrigated with Saline Topical Anesthetic Applied: Other: lidocaine 4%, Treatment Notes Wound #2 (Right, Medial Malleolus) 1. Cleansed with: Clean wound with Normal Saline 2. Anesthetic Topical Lidocaine 4% cream to wound bed prior to debridement 4. Dressing Applied: Hydrafera Blue 5. Secondary Dressing Applied Dry Gauze Kerlix/Conform 7. Secured with Secretary/administrator) Signed: 05/08/2017 4:54:17 PM By: Alejandro Mulling Entered By: Alejandro Mulling on 05/07/2017 08:11:50 [name]  ([id number]) -------------------------------------------------------------------------------- Vitals Details Patient Name: [name] Date of Service: 05/07/2017 8:00 AM Medical Record Number: [id number] Patient Account Number: [id number] Date of Birth/Sex: 10/20/1945 (71 y.o. Male) Treating RN: Ashok Cordia, [name] Primary Care Torian Quintero: [name] Other Clinician: Referring Rontae Inglett: [name] Treating Sahaj Bona/Extender: [name] Weeks in Treatment: 68 Vital Signs Time Taken: 08:05 Temperature (F): 98.0 Height (in): 76 Pulse (bpm): 75 Weight (lbs): 238 Respiratory Rate (breaths/min): 18 Body Mass Index (BMI): 29 Blood Pressure (mmHg): 127/62 Reference Range: 80 - 120 mg / dl Electronic Signature(s) Signed: 05/08/2017 4:54:17 PM By: Alejandro Mulling Entered By: Alejandro Mulling on 05/07/2017 08:07:35

## 2017-05-09 NOTE — Progress Notes (Signed)
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 being said he really does not have swelling anymore compared to what he has had in the past following his vascular intervention. He wonders if potentially we could attempt avoiding the rats to see if he could cleanse the wound in between to try to prevent some of the fiber and its buildup that occurs in the interim between when we see him week to  week. No fevers, chills, nausea, or vomiting noted at this time. 04/16/17; noted that the staff made a choice last week not to put him in compression. The patient is changing his dressing at home using [facility] and changing every second day. His wife is washing the wound with saline. He is using kerlix. Surprisingly he has not developed a lot of edema. This choice was made because of the degree of fluid retention and maceration even with changing his dressing twice a week 04/23/17; absolutely no change. Using Hydrofera Blue. Recurrent tightly adherent nonviable surface material. This is been refractory to Iodoflex, sorbact and now Hydrofera Blue. More recently he has been changing his own dressings at home, cleansing the wound in the shower. He has not developed lower extremity edema 04/30/17; if anything the wound is larger still in adherent surface although it debrided easily today. I've been using Mehta honey for 1 week and I'd like to try and do it for a second week this see if we can get some form of viable surface here. 05/07/17; patient arrives with copious amounts of drainage and some pain in the superior part of the wound. He has not been systemically unwell. He's been changing this daily at home Objective Constitutional Sitting or standing Blood Pressure is within target range for patient.. Pulse regular and within target range for patient.Marland Kitchen Respirations regular, non-labored and within target range.. Temperature is normal and within the target range for the patient.Marland Kitchen appears in no distress. Vitals Time Taken: 8:05  AM, Height: 76 in, Weight: 238 lbs, BMI: 29, Temperature: 98.0 F, Pulse: 75 bpm, Respiratory Rate: 18 breaths/min, Blood Pressure: 127/62 mmHg. [name], [name] ([id number]) General Notes: Exam; there is absolutely no improvement in the wound areas especially laterally where there is a small open wound. Using a #5 curet difficult to debridement infection discomfort. Most stasis with direct pressure. I see no evidence of circumferential infection, nevertheless because of the discomfort I have cultured the surface of the wound. Consider a deep tissue culture. Also will order a x-ray Integumentary (Hair, Skin) Wound #1 status is Open. Original cause of wound was Gradually Appeared. The wound is located on the Right,Lateral Malleolus. The wound measures 4.8cm length x 3.8cm width x 0.2cm depth; 14.326cm^2 area and 2.865cm^3 volume. There is Fat Layer (Subcutaneous Tissue) Exposed exposed. There is no tunneling or undermining noted. There is a large amount of serous drainage noted. The wound margin is distinct with the outline attached to the wound base. There is no granulation within the wound bed. There is a large (67- 100%) amount of necrotic tissue within the wound bed including Adherent Slough. The periwound skin appearance exhibited: Scarring, Maceration, Hemosiderin Staining. The periwound skin appearance did not exhibit: Callus, Crepitus, Excoriation, Induration, Rash, Dry/Scaly, Atrophie Blanche, Cyanosis, Ecchymosis, Mottled, Pallor, Rubor, Erythema. Periwound temperature was noted as No Abnormality. The periwound has tenderness on palpation. Wound #2 status is Open. Original cause of wound was Gradually Appeared. The wound is located on the Right,Medial Malleolus. The wound measures 2.8cm length x 2.7cm width x 0.2cm depth; 5.938cm^2 area and 1.188cm^3 volume. There is Fat Layer (Subcutaneous Tissue) Exposed exposed. There is no tunneling or undermining noted. There is a large amount  of serous drainage noted. The wound margin is distinct with the outline attached to the wound base. There is no granulation within the wound bed. There is a large (67- 100%) amount of necrotic tissue within the wound bed including Adherent Slough. The periwound skin appearance exhibited:  Scarring, Maceration, Hemosiderin Staining. The periwound skin appearance did not exhibit: Callus, Crepitus, Excoriation, Induration, Rash, Dry/Scaly, Atrophie Blanche, Cyanosis, Ecchymosis, Mottled, Pallor, Rubor, Erythema. Periwound temperature was noted as No Abnormality. The periwound has tenderness on palpation. 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 Procedures Wound #1 Pre-procedure diagnosis of Wound #1 is a Venous Leg Ulcer located on the Right,Lateral Malleolus .Severity of Tissue Pre Debridement is: Fat layer exposed. There was a Skin/Subcutaneous Tissue Debridement ([id number]) debridement with total area of 18.24 sq cm performed by [name], MD. with the following instrument(s): Curette to remove Viable and Non-Viable tissue/material including Exudate, Fibrin/Slough, and Subcutaneous after achieving pain control using Lidocaine 4% Topical Solution. 1 Specimen was taken by a Swab and sent to the lab per facility protocol.A time out was conducted at 08:18, prior to the start of the procedure. A Minimum amount of bleeding  was controlled with Pressure. The procedure was tolerated well with a pain level of 0 throughout and a pain level of 0 following the procedure. Post Debridement Measurements: 4.8cm length x 3.8cm width x 0.2cm depth; 2.865cm^3 volume. Character of Wound/Ulcer Post Debridement requires further debridement. Severity of Tissue Post Debridement is: Fat layer exposed. 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 There was a Skin/Subcutaneous Tissue Debridement ([id number]) debridement with total area of 7.56 sq cm performed by [name], MD. with the following instrument(s): Curette to remove Viable and Non-Viable tissue/material including Exudate, Fibrin/Slough, and Subcutaneous after achieving pain control using Lidocaine 4% Topical Solution. 1 Specimen was taken by a Swab and sent to the lab per facility protocol.A time out was conducted at 08:18, prior to the start of the procedure. A Minimum amount of bleeding was controlled with Pressure. The procedure was tolerated well with a pain level of 0 throughout and a pain level of 0 following the procedure. Post Debridement Measurements: 2.8cm length x 2.7cm width x 0.2cm depth; 1.188cm^3 volume. Character of Wound/Ulcer Post Debridement requires further debridement. Severity of Tissue Post Debridement is: Fat layer exposed. Post procedure Diagnosis Wound #2: Same as Pre-Procedure Plan Wound Cleansing: Wound #1 Right,Lateral Malleolus: Clean wound with Normal Saline. May Shower, gently pat wound dry prior to applying new dressing. Wound #2 Right,Medial Malleolus: Clean wound with Normal Saline. May Shower, gently pat wound dry prior to applying new dressing. [name], [name] ([id number]) Anesthetic: Wound #1 Right,Lateral Malleolus: Topical Lidocaine 4%  cream applied to wound bed prior to debridement Wound #2 Right,Medial Malleolus: Topical Lidocaine 4% cream applied to wound bed prior to debridement Skin Barriers/Peri-Wound Care: Wound #1 Right,Lateral Malleolus: Barrier cream - zinc oxide Wound #2 Right,Medial Malleolus: Barrier cream - zinc oxide Primary Wound Dressing: Wound #1 Right,Lateral Malleolus: Other: - Keramax Ag Wound #2 Right,Medial Malleolus: Other: - Keramax Ag Secondary Dressing: Wound #1 Right,Lateral Malleolus: ABD pad Dry Gauze Conform/Kerlix Wound #2 Right,Medial Malleolus: ABD pad Dry Gauze Conform/Kerlix  Dressing Change Frequency: Wound #1 Right,Lateral Malleolus: Change dressing every other day. Other: - PRN visit Friday Wound #2 Right,Medial Malleolus: Change dressing every other day. Other: - PRN visit Friday Follow-up Appointments: Wound #1 Right,Lateral Malleolus: Return Appointment in 1 week. Other: - nurse visit Friday Wound #2 Right,Medial Malleolus: Return Appointment in 1 week. Other: - nurse visit Friday Edema Control: Wound #1 Right,Lateral Malleolus: Elevate legs to the level of the heart and pump ankles as often as possible Other: - wear own compression stockings Wound #2 Right,Medial Malleolus: Elevate legs to the level of the heart and pump ankles as often as possible Other: - wear own compression stockings Additional Orders / Instructions: Wound #1 Right,Lateral Malleolus: Increase protein intake. OK to return to work with the following restrictions: Activity as tolerated Wound #2 Right,Medial Malleolus: Behlke, Chaseton C. ([id number]) Increase protein intake. OK to return to work with the following restrictions: Activity as tolerated #1d/c medihoney 2 empiric C+S no antibiotics for now 3 xray of right ankle 4 change dressg to [employer] due to increased absorbtion Electronic Signature(s) Signed: 05/07/2017 3:57:41 PM By: [name] MD Entered By: [name] on 05/07/2017 08:30:58 [name] ([id number]) -------------------------------------------------------------------------------- SuperBill Details [name] Date of Service: 05/07/2017 Patient Name: C. Patient Account Number: [id number] Medical Record Treating RN: [name] [id number] Number: Other Clinician: Date of Birth/Sex: 1946/01/17 (71 y.o. 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, initial T85.613A encounter Disruption of external operation (surgical) wound, not elsewhere classified, subsequent T81.31XD encounter L97.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 of skin Facility Procedures CPT4: Description Modifier Quantity Code [id number] 11042 - DEB SUBQ TISSUE 20 SQ CM/< 1 ICD-10 Description Diagnosis I87.331 Chronic venous hypertension (idiopathic) with ulcer and inflammation of right lower extremity L97.212 Non-pressure chronic ulcer  of right calf with fat layer exposed CPT4: 24401027 11045 - DEB SUBQ TISS EA ADDL 20CM 1 ICD-10 Description Diagnosis L97.212 Non-pressure chronic ulcer of right calf with fat layer exposed Physician Procedures CPT4: Description Modifier Quantity Code [id number] 11042 - WC PHYS SUBQ TISS 20 SQ CM 1 ICD-10 Description Diagnosis I87.331 WILLARD, FARQUHARSON ([id number]) Electronic Signature(s) Signed: 05/07/2017 3:57:41 PM By: [name] MD Entered By: [name] on 05/07/2017 08:31:49

## 2017-05-10 LAB — AEROBIC CULTURE  (SUPERFICIAL SPECIMEN)

## 2017-05-10 LAB — AEROBIC CULTURE W GRAM STAIN (SUPERFICIAL SPECIMEN)

## 2017-05-14 ENCOUNTER — Encounter: Payer: Medicare HMO | Admitting: Internal Medicine

## 2017-05-14 DIAGNOSIS — I87331 Chronic venous hypertension (idiopathic) with ulcer and inflammation of right lower extremity: Secondary | ICD-10-CM | POA: Diagnosis not present

## 2017-05-15 NOTE — Progress Notes (Signed)
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 for many reasons 04/09/17 on evaluation today patient's right lower extremity wounds appear to be doing a little  better. Fortunately he has no significant discomfort and has been tolerating the dressing changes including the wraps without complication. With that being said he really does not have swelling anymore compared to what he has had in the past following his vascular intervention. He wonders if potentially we could attempt avoiding the rats to see if he could cleanse the  wound in between to try to prevent some of the fiber and its buildup that occurs in the interim between when we see him week to week. No fevers, chills, nausea, or vomiting noted at this time. 04/16/17; noted that the staff made a choice last week not to put him in compression. The patient is changing his dressing at home using [facility] and changing every second day. His wife is washing the wound with saline. He is using kerlix. Surprisingly he has not developed a lot of edema. This choice was made because of the degree of fluid retention and maceration even with changing his dressing twice a week 04/23/17; absolutely no change. Using Hydrofera Blue. Recurrent tightly adherent nonviable surface material. This is been refractory to Iodoflex, sorbact and now Hydrofera Blue. More recently he has been changing his own dressings at home, cleansing the wound in the shower. He has not developed lower extremity edema 04/30/17; if anything the wound is larger still in adherent surface although it debrided easily today. I've been using Mehta honey for 1 week and I'd like to try and do it for a second week this see if we can get some form of viable surface here. 05/07/17; patient arrives with copious amounts of drainage and some pain in the superior part of the wound. He has not been systemically unwell. He's been changing this daily at home 05/14/17; patient arrives today complaining of less drainage and less pain. Dimensions slightly better. Surface culture I did of this last week grew MSSA I put him on dicloxacillin for 10 days. Patient  requesting a further prescription since he feels so much better. He did not obtain the kerramax AG for reasons that aren't clear, he has been using Hydrofera Blue Objective Constitutional Sitting or standing Blood Pressure is within target range for patient.. Pulse regular and within target range for patient.Marland Kitchen Respirations regular, non-labored and within target range.. Temperature is normal and within [place], Deangelo C. ([id number]) the target range for the patient.Marland Kitchen appears in no distress. Vitals Time Taken: 8:10 AM, Height: 76 in, Weight: 238 lbs, BMI: 29, Temperature: 98.3 F, Pulse: 81 bpm, Respiratory Rate: 18 breaths/min, Blood Pressure: 139/80 mmHg. General Notes: Wound exam; not much change in overall appearance although her intake nurse reports better dimensions. Still a tightly adherent nonviable necrotic surface which requires difficult debridement with a #5 curet minimal bleeding hemostasis with direct pressure. He tolerates this marginally. The area medially also was debrided. There is no surrounding soft tissue tenderness no evidence of ischemia. Previous scar tissue from grafting in this site Integumentary (Hair, Skin) Wound #1 status is Open. Original cause of wound was Gradually Appeared. The wound is located on the Right,Lateral Malleolus. The wound measures 4cm length x 3.8cm width x 0.2cm depth; 11.938cm^2 area and 2.388cm^3 volume. There is Fat Layer (Subcutaneous Tissue) Exposed exposed. There is no tunneling or undermining noted. There is a large amount of serous drainage noted. The wound margin is distinct with the outline attached to the wound base. There is no granulation within the wound bed. There is a large (67- 100%) amount of necrotic tissue within the wound bed including Adherent Slough. The periwound skin appearance exhibited: Scarring, Maceration, Hemosiderin Staining. The periwound skin appearance did not exhibit: Callus, Crepitus, Excoriation, Induration,  Rash, Dry/Scaly, Atrophie Blanche, Cyanosis, Ecchymosis, Mottled, Pallor, Rubor, Erythema. Periwound temperature was noted as No Abnormality. The periwound has tenderness on palpation. Wound #2 status is Open. Original cause of wound was Gradually Appeared. The wound  is located on the Right,Medial Malleolus. The wound measures 2.8cm length x 2.4cm width x 0.2cm depth; 5.278cm^2 area and 1.056cm^3 volume. There is Fat Layer (Subcutaneous Tissue) Exposed exposed. There is no tunneling or undermining noted. There is a large amount of serous drainage noted. The wound margin is distinct with the outline attached to the wound base. There is no granulation within the wound bed. There is a large (67- 100%) amount of necrotic tissue within the wound bed including Adherent Slough. The periwound skin appearance exhibited: Scarring, Maceration, Hemosiderin Staining. The periwound skin appearance did not exhibit: Callus, Crepitus, Excoriation, Induration, Rash, Dry/Scaly, Atrophie Blanche, Cyanosis, Ecchymosis, Mottled, Pallor, Rubor, Erythema. Periwound temperature was noted as No Abnormality. The periwound has tenderness on palpation. 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 ([id number]) L97.211 - Non-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 to breakdown of skin Procedures Wound #1 Pre-procedure diagnosis of Wound #1 is a Venous Leg Ulcer located on the Right,Lateral Malleolus .Severity of Tissue Pre Debridement is: Fat  layer exposed. There was a Skin/Subcutaneous Tissue Debridement ([id number]) debridement with total area of 15.2 sq cm performed by [name], MD. with the following instrument(s): Curette to remove Viable and Non-Viable tissue/material including Exudate, Fibrin/Slough, and Subcutaneous after achieving pain control using Lidocaine 4% Topical Solution. A time out was conducted at 08:23, prior to the start of the procedure. A Minimum amount of bleeding was controlled with Pressure. The procedure was tolerated well with a pain level of 0 throughout and a pain level of 0 following the procedure. Post Debridement Measurements: 4.2cm length x 3.8cm width x 0.2cm depth; 2.507cm^3 volume. Character of Wound/Ulcer Post Debridement requires further debridement. Severity of Tissue Post Debridement is: Fat layer exposed. 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 There was a Skin/Subcutaneous Tissue Debridement ([id number]) debridement with total area of 6.72 sq cm performed by [name], MD. with the following instrument(s): Curette to remove Viable and Non-Viable tissue/material including Exudate, Fibrin/Slough, and Subcutaneous after achieving pain control using Lidocaine 4% Topical Solution. A time out was conducted at 08:23, prior to the start of the procedure. A Minimum amount of bleeding was controlled with Pressure. The procedure was tolerated well with a pain level of 0 throughout and a pain level of 0 following the procedure. Post Debridement Measurements: 2.8cm length x 2.3cm width x 0.2cm depth; 1.012cm^3 volume. Character of Wound/Ulcer Post Debridement requires further debridement. Severity of Tissue Post Debridement is: Fat layer exposed. Post procedure Diagnosis Wound #2: Same as  Pre-Procedure Plan The following medication(s) was prescribed: dicloxacillin oral 500 mg capsule 1capsule oral qid for a further week. Continuing Rx for wound infection starting 05/20/2017 TRAYCEN, GOYER ([id number]) #1 I have given him an extension of dicloxacillin I prescribed last week [E scribed] #2 it seems self evidence that we are having a tremendous bottle with this gentleman's surface bioburden. I'm going to recommend anasept gel under the Hydrofera Blue. #3 there is no evidence of  soft tissue infection nevertheless his drainage seems a lot better this week and the discomfort that he was contacting about superiorly also better. He has not been systemically unwell Electronic Signature(s) Signed: 05/14/2017 4:13:20 PM By: [name] MD Entered By: [name] on 05/14/2017 08:40:57 [name] ([id number]) -------------------------------------------------------------------------------- SuperBill Details [name] Date of Service: 05/14/2017 Patient Name: C. Patient Account Number: [id number] Medical Record Treating RN: [name] [id number] Number: Other Clinician: Date of Birth/Sex: [birth year] (71 y.o. 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, initial T85.613A encounter Disruption of external operation (surgical) wound, not elsewhere classified, subsequent T81.31XD encounter L97.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 of skin Facility  Procedures CPT4 Code: [id number] Description: 11042 - DEB SUBQ TISSUE 20 SQ CM/< ICD-10 Description Diagnosis L97.212 Non-pressure chronic ulcer of right calf with fat la Modifier: yer exposed Quantity: 1 CPT4 Code: [id number] Description: 11045 - DEB SUBQ TISS EA ADDL 20CM ICD-10 Description Diagnosis L97.212 Non-pressure chronic ulcer of right calf with fat la Modifier: yer exposed Quantity: 1 Physician Procedures CPT4 Code: [id number] Description: 11042 - WC PHYS SUBQ TISS 20 SQ CM ICD-10 Description Diagnosis L97.212 Non-pressure chronic ulcer of right calf with fat la Modifier: yer exposed Quantity: 1 CPT4 Code: [id number] Prabhakar, L Description: 11045 - WC PHYS SUBQ TISS EA ADDL 20 CM Description Tillman Abide ([id number]) Modifier: Quantity: 1 Electronic Signature(s) Signed: 05/14/2017 4:13:20 PM By: [name] MD Entered By: [name] on 05/14/2017 08:41:29

## 2017-05-15 NOTE — Progress Notes (Signed)
ADRIENNE, DELAY ([id number]) Visit Report for 05/14/2017 Arrival Information Details Patient Name: [name], [name]. Date of Service: 05/14/2017 8:00 AM Medical Record Number: [id number] Patient Account Number: [id number] Date of Birth/Sex: Feb 12, 1946 (71 y.o. Male) Treating RN: [name] Primary Care Adylin Hankey: [name] Other Clinician: Referring Ahman Dugdale: [name] Treating Breland Elders/Extender: Altamese  in Treatment: 69 Visit Information History Since Last Visit All ordered tests and consults were completed: No Patient Arrived: Ambulatory Added or deleted any medications: No Arrival Time: 08:03 Any new allergies or adverse reactions: No Accompanied By: self Had a fall or experienced change in No Transfer Assistance: None activities of daily living that may affect Patient Identification Verified: Yes risk of falls: Secondary Verification Process Yes Signs or symptoms of abuse/neglect since last No Completed: visito Patient Requires Transmission- No Hospitalized since last visit: No Based Precautions: Has Dressing in Place as Prescribed: Yes Patient Has Alerts: Yes Pain Present Now: No Patient Alerts: Patient on Blood Thinner Electronic Signature(s) Signed: 05/14/2017 4:19:24 PM By: Alejandro Mulling Entered By: Alejandro Mulling on 05/14/2017 08:10:40 [name] ([id number]) -------------------------------------------------------------------------------- Encounter Discharge Information Details Patient Name: [name] Date of Service: 05/14/2017 8:00 AM Medical Record Number: [id number] Patient Account Number: [id number] Date of Birth/Sex: [birth year] (71 y.o. Male) Treating RN: [name] Primary Care Zylee Marchiano: [name] Other Clinician: Referring Lothar Prehn: [name] Treating Gearlene Godsil/Extender: [name] Weeks in Treatment: 12 Encounter Discharge Information Items Discharge Pain Level: 0 Discharge  Condition: Stable Ambulatory Status: Ambulatory Discharge Destination: Home Transportation: Private Auto Accompanied By: self Schedule Follow-up Appointment: Yes Medication Reconciliation completed No and provided to Patient/Care Valente Fosberg: Patient Clinical Summary of Care: Declined Electronic Signature(s) Signed: 05/14/2017 4:18:12 PM By: Gwenlyn Perking Entered By: Gwenlyn Perking on 05/14/2017 08:35:09 [name] ([id number]) -------------------------------------------------------------------------------- Lower Extremity Assessment Details Patient Name: [name] Date of Service: 05/14/2017 8:00 AM Medical Record Number: [id number] Patient Account Number: [id number] Date of Birth/Sex: Jul 02, 1946 (71 y.o. Male) Treating RN: [name] Primary Care Ellory Khurana: [name] Other Clinician: Referring Sammuel Blick: [name] Treating Trinitie Mcgirr/Extender: [name] Weeks in Treatment: 69 Vascular Assessment Pulses: Dorsalis Pedis Palpable: [Right:Yes] Posterior Tibial Extremity colors, hair growth, and conditions: Extremity Color: [Right:Normal] Temperature of Extremity: [Right:Warm] Capillary Refill: [Right:< 3 seconds] Toe Nail Assessment Left: Right: Thick: Yes Discolored: Yes Deformed: No Improper Length and Hygiene: Yes Electronic Signature(s) Signed: 05/14/2017 4:19:24 PM By: Alejandro Mulling Entered By: Alejandro Mulling on 05/14/2017 08:20:45 [name] ([id number]) -------------------------------------------------------------------------------- Multi Wound Chart Details Patient Name: [name] Date of Service: 05/14/2017 8:00 AM Medical Record Number: [id number] Patient Account Number: [id number] Date of Birth/Sex: 10-Dec-1945 (71 y.o. Male) Treating RN: Ashok Cordia, [name] Primary Care Demiana Crumbley: [name] Other Clinician: Referring Eustacia Urbanek: [name] Treating Catricia Scheerer/Extender: [name] Weeks in Treatment:  69 Vital Signs Height(in): 76 Pulse(bpm): 81 Weight(lbs): 238 Blood Pressure 139/80 (mmHg): Body Mass Index(BMI): 29 Temperature(F): 98.3 Respiratory Rate 18 (breaths/min): Photos: [1:No Photos] [2:No Photos] [N/A:N/A] Wound Location: [1:Right Malleolus - Lateral] [2:Right Malleolus - Medial] [N/A:N/A] Wounding Event: [1:Gradually Appeared] [2:Gradually Appeared] [N/A:N/A] Primary Etiology: [1:Venous Leg Ulcer] [2:Venous Leg Ulcer] [N/A:N/A] Comorbid History: [1:Cataracts, Chronic Obstructive Pulmonary Disease (COPD), Osteoarthritis] [2:Cataracts, Chronic Obstructive Pulmonary Disease (COPD), Osteoarthritis] [N/A:N/A] Date Acquired: [1:08/11/2015] [2:01/30/2016] [N/A:N/A] Weeks of Treatment: [1:69] [2:67] [N/A:N/A] Wound Status: [1:Open] [2:Open] [N/A:N/A] Clustered Wound: [1:No] [2:Yes] [N/A:N/A] Measurements L x W x D 4x3.8x0.2 [2:2.8x2.4x0.2] [N/A:N/A] (cm) Area (cm) : [1:11.938] [2:5.278] [N/A:N/A] Volume (cm) : [1:2.388] [2:1.056] [N/A:N/A] % Reduction in Area: [1:30.90%] [  2:54.30%] [N/A:N/A] % Reduction in Volume: 53.90% [2:8.60%] [N/A:N/A] Classification: [1:Full Thickness Without Exposed Support Structures] [2:Full Thickness Without Exposed Support Structures] [N/A:N/A] Exudate Amount: [1:Large] [2:Large] [N/A:N/A] Exudate Type: [1:Serous] [2:Serous] [N/A:N/A] Exudate Color: [1:amber] [2:amber] [N/A:N/A] Wound Margin: [1:Distinct, outline attached] [2:Distinct, outline attached] [N/A:N/A] Granulation Amount: [1:None Present (0%)] [2:None Present (0%)] [N/A:N/A] Necrotic Amount: [1:Large (67-100%)] [2:Large (67-100%)] [N/A:N/A] Exposed Structures: [1:Fat Layer (Subcutaneous Tissue) Exposed: Yes Fascia: No] [2:Fat Layer (Subcutaneous Tissue) Exposed: Yes Fascia: No] [N/A:N/A] Tendon: No Tendon: No Muscle: No Muscle: No Joint: No Joint: No Bone: No Bone: No Epithelialization: Small (1-33%) Small (1-33%) N/A Debridement: Debridement (16109- Debridement (60454-  N/A 11047) 11047) Pre-procedure 08:23 08:23 N/A Verification/Time Out Taken: Pain Control: Lidocaine 4% Topical Lidocaine 4% Topical N/A Solution Solution Tissue Debrided: Fibrin/Slough, Exudates, Fibrin/Slough, Exudates, N/A Subcutaneous Subcutaneous Level: Skin/Subcutaneous Skin/Subcutaneous N/A Tissue Tissue Debridement Area (sq 15.2 6.72 N/A cm): Instrument: Curette Curette N/A Bleeding: Minimum Minimum N/A Hemostasis Achieved: Pressure Pressure N/A Procedural Pain: 0 0 N/A Post Procedural Pain: 0 0 N/A Debridement Treatment Procedure was tolerated Procedure was tolerated N/A Response: well well Post Debridement 4.2x3.8x0.2 2.8x2.3x0.2 N/A Measurements L x W x D (cm) Post Debridement 2.507 1.012 N/A Volume: (cm) Periwound Skin Texture: Scarring: Yes Scarring: Yes N/A Excoriation: No Excoriation: No Induration: No Induration: No Callus: No Callus: No Crepitus: No Crepitus: No Rash: No Rash: No Periwound Skin Maceration: Yes Maceration: Yes N/A Moisture: Dry/Scaly: No Dry/Scaly: No Periwound Skin Color: Hemosiderin Staining: Yes Hemosiderin Staining: Yes N/A Atrophie Blanche: No Atrophie Blanche: No Cyanosis: No Cyanosis: No Ecchymosis: No Ecchymosis: No Erythema: No Erythema: No Mottled: No Mottled: No Pallor: No Pallor: No Rubor: No Rubor: No Temperature: No Abnormality No Abnormality N/A Tenderness on Yes Yes N/A Palpation: Wound Preparation: N/A OPAL, MCKELLIPS ([id number]) Ulcer Cleansing: Ulcer Cleansing: Rinsed/Irrigated with Rinsed/Irrigated with Saline Saline Topical Anesthetic Topical Anesthetic Applied: Other: lidocaine Applied: Other: lidocaine 4% 4% Procedures Performed: Debridement Debridement N/A Treatment Notes Electronic Signature(s) Signed: 05/14/2017 4:13:20 PM By: [name] MD Entered By: [name] on 05/14/2017 08:35:51 [name]  ([id number]) -------------------------------------------------------------------------------- Multi-Disciplinary Care Plan Details Patient Name: [name] Date of Service: 05/14/2017 8:00 AM Medical Record Number: [id number] Patient Account Number: [id number] Date of Birth/Sex: 1946-07-05 (71 y.o. Male) Treating RN: [name] Primary Care Feiga Nadel: [name] Other Clinician: Referring Avelynn Sellin: [name] Treating Suresh Audi/Extender: [name] Weeks in Treatment: 69 Active Inactive ` Venous Leg Ulcer Nursing Diagnoses: Knowledge deficit related to disease process and management Goals: Patient will maintain optimal edema control Date Initiated: 04/03/2016 Target Resolution Date: 11/24/2016 Goal Status: Active Interventions: Compression as ordered Treatment Activities: Therapeutic compression applied : 04/03/2016 Notes: ` Wound/Skin Impairment Nursing Diagnoses: Impaired tissue integrity Goals: Ulcer/skin breakdown will heal within 14 weeks Date Initiated: 01/17/2016 Target Resolution Date: 11/24/2016 Goal Status: Active Interventions: Assess ulceration(s) every visit Notes: Electronic Signature(s) Signed: 05/14/2017 4:19:24 PM By: Aurelio Jew, [name] ([id number]) Entered By: Alejandro Mulling on 05/14/2017 08:23:17 [name] ([id number]) -------------------------------------------------------------------------------- Pain Assessment Details Patient Name: [name] Date of Service: 05/14/2017 8:00 AM Medical Record Number: [id number] Patient Account Number: [id number] Date of Birth/Sex: Aug 30, 1945 (71 y.o. Male) Treating RN: [name] Primary Care Sherine Cortese: [name] Other Clinician: Referring Eryx Zane: [name] Treating Aariona Momon/Extender: [name] Weeks in Treatment: 69 Active Problems Location of Pain Severity and Description of Pain Patient Has Paino No Site Locations Pain Management  and Medication Current Pain Management: Electronic Signature(s) Signed: 05/14/2017 4:19:24 PM By: Ashok Cordia,  Debra Entered By: Alejandro Mulling on 05/14/2017 08:10:47 [name] ([id number]) -------------------------------------------------------------------------------- Patient/Caregiver Education Details [name] Date of Service: 05/14/2017 8:00 AM Patient Name: C. Patient Account Number: [id number] Medical Record Treating RN: [name] [id number] Number: Other Clinician: Date of Birth/Gender: 05-14-1946 (71 y.o. Male) Treating [name], [name] Primary Care Physician: [name] Physician/Extender: G Referring Physician: [name] in Treatment: 31 Education Assessment Education Provided To: Patient Education Topics Provided Wound/Skin Impairment: Handouts: Other: change dressing as ordered Methods: Demonstration, Explain/Verbal Responses: State content correctly Electronic Signature(s) Signed: 05/14/2017 4:19:24 PM By: Alejandro Mulling Entered By: Alejandro Mulling on 05/14/2017 08:24:15 [name] ([id number]) -------------------------------------------------------------------------------- Wound Assessment Details Patient Name: [name] Date of Service: 05/14/2017 8:00 AM Medical Record Number: [id number] Patient Account Number: [id number] Date of Birth/Sex: [birth year] (71 y.o. Male) Treating RN: Ashok Cordia, [name] Primary Care Joseline Mccampbell: [name] Other Clinician: Referring Holiday Mcmenamin: [name] Treating Zale Marcotte/Extender: [name] Weeks in Treatment: 69 Wound Status Wound Number: 1 Primary Venous Leg Ulcer Etiology: Wound Location: Right Malleolus - Lateral Wound Open Wounding Event: Gradually Appeared Status: Date Acquired: 08/11/2015 Comorbid Cataracts, Chronic Obstructive Weeks Of Treatment: 69 History: Pulmonary Disease (COPD), Clustered Wound: No Osteoarthritis Photos Photo Uploaded By:  Alejandro Mulling on 05/14/2017 09:29:57 Wound Measurements Length: (cm) 4 Width: (cm) 3.8 Depth: (cm) 0.2 Area: (cm) 11.938 Volume: (cm) 2.388 % Reduction in Area: 30.9% % Reduction in Volume: 53.9% Epithelialization: Small (1-33%) Tunneling: No Undermining: No Wound Description Full Thickness Without Exposed Foul Odor After Classification: Support Structures Slough/Fibrino Wound Margin: Distinct, outline attached Exudate Large Amount: Exudate Type: Serous Exudate Color: amber Cleansing: No Yes Wound Bed Granulation Amount: None Present (0%) Exposed Structure Necrotic Amount: Large (67-100%) Fascia Exposed: No Franek, Banks C. ([id number]) Necrotic Quality: Adherent Slough Fat Layer (Subcutaneous Tissue) Exposed: Yes Tendon Exposed: No Muscle Exposed: No Joint Exposed: No Bone Exposed: No Periwound Skin Texture Texture Color No Abnormalities Noted: No No Abnormalities Noted: No Callus: No Atrophie Blanche: No Crepitus: No Cyanosis: No Excoriation: No Ecchymosis: No Induration: No Erythema: No Rash: No Hemosiderin Staining: Yes Scarring: Yes Mottled: No Pallor: No Moisture Rubor: No No Abnormalities Noted: No Dry / Scaly: No Temperature / Pain Maceration: Yes Temperature: No Abnormality Tenderness on Palpation: Yes Wound Preparation Ulcer Cleansing: Rinsed/Irrigated with Saline Topical Anesthetic Applied: Other: lidocaine 4%, Treatment Notes Wound #1 (Right, Lateral Malleolus) 1. Cleansed with: Clean wound with Normal Saline 2. Anesthetic Topical Lidocaine 4% cream to wound bed prior to debridement 4. Dressing Applied: Hydrafera Blue 5. Secondary Dressing Applied ABD Pad Kerlix/Conform 7. Secured with Tape Notes drawtex Electronic Signature(s) Signed: 05/14/2017 4:19:24 PM By: Alejandro Mulling Entered By: Alejandro Mulling on 05/14/2017 08:17:56 [name]  ([id number]) -------------------------------------------------------------------------------- Wound Assessment Details Patient Name: [name] Date of Service: 05/14/2017 8:00 AM Medical Record Number: [id number] Patient Account Number: [id number] Date of Birth/Sex: June 11, 1946 (71 y.o. Male) Treating RN: [name] Primary Care Radiance Deady: [name] Other Clinician: Referring Starkeisha Vanwinkle: [name] Treating Deyjah Kindel/Extender: [name] Weeks in Treatment: 69 Wound Status Wound Number: 2 Primary Venous Leg Ulcer Etiology: Wound Location: Right Malleolus - Medial Wound Open Wounding Event: Gradually Appeared Status: Date Acquired: 01/30/2016 Comorbid Cataracts, Chronic Obstructive Weeks Of Treatment: 67 History: Pulmonary Disease (COPD), Clustered Wound: Yes Osteoarthritis Photos Photo Uploaded By: Alejandro Mulling on 05/14/2017 09:29:57 Wound Measurements Length: (cm) 2.8 Width: (cm) 2.4 Depth: (cm) 0.2 Area: (cm) 5.278 Volume: (cm) 1.056 % Reduction in Area: 54.3% % Reduction in Volume: 8.6% Epithelialization: Small (1-33%)  Tunneling: No Undermining: No Wound Description Full Thickness Without Exposed Foul Odor After Classification: Support Structures Slough/Fibrino Wound Margin: Distinct, outline attached Exudate Large Amount: Exudate Type: Serous Exudate Color: amber Cleansing: No Yes Wound Bed Granulation Amount: None Present (0%) Exposed Structure Necrotic Amount: Large (67-100%) Fascia Exposed: No Walls, Peter C. ([id number]) Necrotic Quality: Adherent Slough Fat Layer (Subcutaneous Tissue) Exposed: Yes Tendon Exposed: No Muscle Exposed: No Joint Exposed: No Bone Exposed: No Periwound Skin Texture Texture Color No Abnormalities Noted: No No Abnormalities Noted: No Callus: No Atrophie Blanche: No Crepitus: No Cyanosis: No Excoriation: No Ecchymosis: No Induration: No Erythema: No Rash: No Hemosiderin Staining:  Yes Scarring: Yes Mottled: No Pallor: No Moisture Rubor: No No Abnormalities Noted: No Dry / Scaly: No Temperature / Pain Maceration: Yes Temperature: No Abnormality Tenderness on Palpation: Yes Wound Preparation Ulcer Cleansing: Rinsed/Irrigated with Saline Topical Anesthetic Applied: Other: lidocaine 4%, Treatment Notes Wound #2 (Right, Medial Malleolus) 1. Cleansed with: Clean wound with Normal Saline 2. Anesthetic Topical Lidocaine 4% cream to wound bed prior to debridement 4. Dressing Applied: Hydrafera Blue 5. Secondary Dressing Applied ABD Pad Kerlix/Conform 7. Secured with Tape Notes drawtex Electronic Signature(s) Signed: 05/14/2017 4:19:24 PM By: Alejandro Mulling Entered By: Alejandro Mulling on 05/14/2017 08:20:02 [name] ([id number]) -------------------------------------------------------------------------------- Vitals Details Patient Name: [name] Date of Service: 05/14/2017 8:00 AM Medical Record Number: [id number] Patient Account Number: [id number] Date of Birth/Sex: [birth year] (71 y.o. Male) Treating RN: Ashok Cordia, [name] Primary Care Derian Pfost: [name] Other Clinician: Referring Abdul Beirne: [name] Treating Janeil Schexnayder/Extender: [name] Weeks in Treatment: 69 Vital Signs Time Taken: 08:10 Temperature (F): 98.3 Height (in): 76 Pulse (bpm): 81 Weight (lbs): 238 Respiratory Rate (breaths/min): 18 Body Mass Index (BMI): 29 Blood Pressure (mmHg): 139/80 Reference Range: 80 - 120 mg / dl Electronic Signature(s) Signed: 05/14/2017 4:19:24 PM By: Alejandro Mulling Entered By: Alejandro Mulling on 05/14/2017 08:12:46

## 2017-05-21 ENCOUNTER — Encounter: Payer: Medicare HMO | Admitting: Internal Medicine

## 2017-05-21 DIAGNOSIS — I87331 Chronic venous hypertension (idiopathic) with ulcer and inflammation of right lower extremity: Secondary | ICD-10-CM | POA: Diagnosis not present

## 2017-05-22 NOTE — Progress Notes (Signed)
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 advanced treatment options for many reasons 04/09/17 on evaluation today patient's right lower extremity wounds appear to be doing a little better. Fortunately he has no significant discomfort and has been tolerating the dressing changes including the wraps without complication. With that being said he really does not have swelling anymore  compared to what he has had in the past following his vascular intervention. He wonders if potentially we could attempt avoiding the rats to see if he could cleanse the wound in between to try to prevent some of the fiber and its buildup that occurs in the interim between when we see him week to week. No fevers, chills, nausea, or vomiting noted at this time. 04/16/17; noted that the staff made a choice last week not to put him in compression. The patient is changing his dressing at home using [facility] and changing every second day. His wife is washing the wound with saline. He is using kerlix. Surprisingly he has not developed a lot of edema. This choice was made because of the degree of fluid retention and maceration even with changing his dressing twice a week 04/23/17; absolutely no change. Using Hydrofera Blue. Recurrent tightly adherent nonviable surface material. This is been refractory to Iodoflex, sorbact and now Hydrofera Blue. More recently he has been changing his own dressings at home, cleansing the wound in the shower. He has not developed lower extremity edema 04/30/17; if anything the wound is larger still in adherent surface although it debrided easily today. I've been using Mehta honey for 1 week and I'd like to try and do it for a second week this see if we can get some form of viable surface here. 05/07/17; patient arrives with copious amounts of drainage and some pain in the superior part of the wound. He has not been systemically unwell. He's been changing this daily at home 05/14/17;  patient arrives today complaining of less drainage and less pain. Dimensions slightly better. Surface culture I did of this last week grew MSSA I put him on dicloxacillin for 10 days. Patient requesting a further prescription since he feels so much better. He did not obtain the [facility] for reasons that aren't clear, he has been using Hydrofera Blue 05/21/17; perhaps some less drainage and less pain. He is completing another week of doxycycline. Unfortunately there is no change in the wound measurements are appearance still tightly adherent necrotic surface material that is really defied treatment. We have been using Hydrofera Blue most recently. He did not manage to get Anasept. He was told it was prescription at [facility] KENROY, TIMBERMAN. ([id number]) Constitutional Sitting or standing Blood Pressure is within target range for patient.. Pulse regular and within target range for patient.Marland Kitchen Respirations regular, non-labored and within target range.. Temperature is normal and within the target range for the patient.Marland Kitchen appears in no distress. Vitals Time Taken: 8:01 AM, Height: 76 in, Weight: 238 lbs, BMI: 29, Temperature: 97.8 F, Pulse: 75 bpm, Respiratory Rate: 18 breaths/min, Blood Pressure: 144/75 mmHg. General Notes: Wound exam; not much change in overall appearance. Small island of normal skin in the middle of this. Tightly adherent nonviable necrotic material which requires difficult debridements each time he is here however post debridement usually the surface looks fairly viable. Both his larger right lateral and right medial wounds look the same. Surrounding skin grafting complicating the issue. He tolerates debridement marginally hemostasis with direct pressure. No evidence of surrounding infection or ischemia Integumentary (Hair, Skin) Wound #1 status is Open. Original cause of wound was Gradually Appeared. The wound is located on the Right,Lateral Malleolus. The wound  measures 4.5cm length x 3.5cm width x 0.2cm depth; 12.37cm^2 area and 2.474cm^3 volume. There is Fat Layer (Subcutaneous Tissue) Exposed exposed. There is no tunneling or undermining noted. There is a large amount  of serous drainage noted. The wound margin is distinct with the outline attached to the wound base. There is no granulation within the wound bed. There is a large (67- 100%) amount of necrotic tissue within the wound bed including Adherent Slough. The periwound skin appearance exhibited: Scarring, Maceration, Hemosiderin Staining. The periwound skin appearance did not exhibit: Callus, Crepitus, Excoriation, Induration, Rash, Dry/Scaly, Atrophie Blanche, Cyanosis, Ecchymosis, Mottled, Pallor, Rubor, Erythema. Periwound temperature was noted as No Abnormality. The periwound has tenderness on palpation. Wound #2 status is Open. Original cause of wound was Gradually Appeared. The wound is located on the Right,Medial Malleolus. The wound measures 3cm length x 2.5cm width x 0.1cm depth; 5.89cm^2 area and 0.589cm^3 volume. There is Fat Layer (Subcutaneous Tissue) Exposed exposed. There is no tunneling or undermining noted. There is a large amount of serous drainage noted. The wound margin is distinct with the outline attached to the wound base. There is no granulation within the wound bed. There is a large (67- 100%) amount of necrotic tissue within the wound bed including Adherent Slough. The periwound skin appearance exhibited: Scarring, Maceration, Hemosiderin Staining. The periwound skin appearance did not exhibit: Callus, Crepitus, Excoriation, Induration, Rash, Dry/Scaly, Atrophie Blanche, Cyanosis, Ecchymosis, Mottled, Pallor, Rubor, Erythema. Periwound temperature was noted as No Abnormality. The periwound has tenderness on palpation. 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 There was a Skin/Subcutaneous Tissue Debridement ([id number]) debridement with total area of 15.75 sq cm performed by [name], MD. with the following instrument(s): scoop to remove Viable and Non-Viable tissue/material including Exudate, Fibrin/Slough, and Subcutaneous after achieving pain control using Lidocaine 4% Topical Solution. A time out was conducted at 08:15, prior to the start of the procedure. A Minimum amount of bleeding was controlled with Pressure. The procedure was tolerated well with a pain level of 0 throughout and a pain level of 0 following the procedure. Post Debridement Measurements: 4.5cm length x 3.5cm width x 0.3cm depth; 3.711cm^3 volume. Character of Wound/Ulcer Post Debridement requires further debridement. Severity of Tissue Post Debridement is: Fat layer exposed. 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 There was a Skin/Subcutaneous Tissue Debridement ([id number]) debridement with total area of 7.5 sq cm performed by [name], MD. with the following instrument(s): scoop to remove Viable and Non-Viable tissue/material including  Exudate, Fibrin/Slough, and Subcutaneous after achieving pain control using Lidocaine 4% Topical Solution. A time out was conducted at 08:15, prior to the start of the procedure. A Minimum amount of bleeding was controlled with Pressure. The procedure was tolerated well with a pain level of 0 throughout and a pain level of 0 following the procedure. Post Debridement Measurements: 3cm length x 2.5cm width x 0.2cm depth; 1.178cm^3 volume. Character of Wound/Ulcer  Post Debridement requires further debridement. Severity of Tissue Post Debridement is: Fat layer exposed. Post procedure Diagnosis Wound #2: Same as Pre-Procedure Plan Wound Cleansing: KOY, LAMP C. ([id number]) Wound #1 Right,Lateral Malleolus: Clean wound with Normal Saline. Cleanse wound with mild soap and water May Shower, gently pat wound dry prior to applying new dressing. Wound #2 Right,Medial Malleolus: Clean wound with Normal Saline. Cleanse wound with mild soap and water May Shower, gently pat wound dry prior to applying new dressing. Anesthetic: Wound #1 Right,Lateral Malleolus: Topical Lidocaine 4% cream applied to wound bed prior to debridement Wound #2 Right,Medial Malleolus: Topical Lidocaine 4% cream applied to wound bed prior to debridement Skin Barriers/Peri-Wound Care: Wound #1 Right,Lateral Malleolus: Barrier cream Wound #2 Right,Medial Malleolus: Barrier cream Primary Wound Dressing: Wound #1 Right,Lateral Malleolus: Cutimed Sorbact Other: - Anicept Gel Wound #2 Right,Medial Malleolus: Cutimed Sorbact Other: - Anicept Gel Secondary Dressing: Wound #1 Right,Lateral Malleolus: ABD pad Conform/Kerlix Drawtex Wound #2 Right,Medial Malleolus: ABD pad Conform/Kerlix Drawtex Dressing Change Frequency: Wound #1 Right,Lateral Malleolus: Change dressing every day. Wound #2 Right,Medial Malleolus: Change dressing every day. Follow-up Appointments: Wound #1 Right,Lateral Malleolus: Return  Appointment in 1 week. Wound #2 Right,Medial Malleolus: Return Appointment in 1 week. Edema Control: Wound #1 Right,Lateral Malleolus: Elevate legs to the level of the heart and pump ankles as often as possible Wound #2 Right,Medial Malleolus: Elevate legs to the level of the heart and pump ankles as often as possible Additional Orders / Instructions: Wound #1 Right,Lateral Malleolus: Grandpre, Somtochukwu C. ([id number]) Increase protein intake. Wound #2 Right,Medial Malleolus: Increase protein intake. Medications-please add to medication list.: Wound #1 Right,Lateral Malleolus: P.O. Antibiotics - Dicloxacillin Other: - Vitamin C, Zinc, MVI Wound #2 Right,Medial Malleolus: P.O. Antibiotics - Dicloxacillin Other: - Vitamin C, Zinc, MVI #1 change to sorbact without hydrogel. He will get Anasept online to cleanse the wound surface #2 consider plastic surgery referral we've really gotten nowhere on this, each week this same tightly adherent material covers this wound . Electronic Signature(s) Signed: 05/21/2017 4:14:11 PM By: [name] MD Entered By: [name] on 05/21/2017 08:29:55 [name] ([id number]) -------------------------------------------------------------------------------- SuperBill Details [name] Date of Service: 05/21/2017 Patient Name: C. Patient Account Number: [id number] Medical Record Treating RN: [name] [id number] Number: Other Clinician: Date of Birth/Sex: 1945-10-26 (71 y.o. 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 Breakdown  (mechanical) of artificial skin graft and decellularized allodermis, initial T85.613A encounter Disruption of external operation (surgical) wound, not elsewhere classified, subsequent T81.31XD encounter L97.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 of skin Facility Procedures CPT4 Code: [id number] Description: 11042 - DEB SUBQ TISSUE 20 SQ CM/< ICD-10 Description Diagnosis L97.212 Non-pressure chronic ulcer of right calf with fat la Modifier: yer exposed Quantity: 1 CPT4 Code: [id number] Description: 11045 - DEB SUBQ TISS EA ADDL 20CM ICD-10 Description Diagnosis L97.212 Non-pressure chronic ulcer of right calf with fat la Modifier: yer exposed Quantity: 1 Physician Procedures CPT4 Code: [id number] Description: 11042 - WC PHYS SUBQ TISS 20 SQ CM ICD-10 Description Diagnosis L97.212 Non-pressure chronic ulcer of right calf with fat la Modifier: yer exposed Quantity: 1 CPT4 Code: [id number] Glauser, L Description: 11045 - WC PHYS SUBQ TISS  EA ADDL 20 CM Description Tillman Abide ([id number]) Modifier: Quantity: 1 Electronic Signature(s) Signed: 05/21/2017 4:14:11 PM By: [name] MD Entered By: [name] on 05/21/2017 08:30:16

## 2017-05-22 NOTE — Progress Notes (Signed)
[name], [name] ([id number]) Visit Report for 05/21/2017 Arrival Information Details Patient Name: [name], [name]. Date of Service: 05/21/2017 8:00 AM Medical Record Number: [id number] Patient Account Number: [id number] Date of Birth/Sex: 1946-01-09 (71 y.o. Male) Treating RN: [name] Primary Care Rebecah Dangerfield: [name] Other Clinician: Referring Geraldine Sandberg: [name] Treating Nichoel Digiulio/Extender: [place] in Treatment: 70 Visit Information History Since Last Visit All ordered tests and consults were completed: No Patient Arrived: Ambulatory Added or deleted any medications: No Arrival Time: 08:01 Any new allergies or adverse reactions: No Accompanied By: self Had a fall or experienced change in No Transfer Assistance: None activities of daily living that may affect Patient Identification Verified: Yes risk of falls: Secondary Verification Process Yes Signs or symptoms of abuse/neglect since last No Completed: visito Patient Requires Transmission- No Hospitalized since last visit: No Based Precautions: Has Dressing in Place as Prescribed: Yes Patient Has Alerts: Yes Pain Present Now: No Patient Alerts: Patient on Blood Thinner Electronic Signature(s) Signed: 05/21/2017 4:23:28 PM By: Alejandro Mulling Entered By: Alejandro Mulling on 05/21/2017 08:01:50 [name] ([id number]) -------------------------------------------------------------------------------- Encounter Discharge Information Details Patient Name: [name] Date of Service: 05/21/2017 8:00 AM Medical Record Number: [id number] Patient Account Number: [id number] Date of Birth/Sex: [birth year] (71 y.o. Male) Treating RN: [name] Primary Care Leighanne Adolph: [name] Other Clinician: Referring Etrulia Zarr: [name] Treating Paiden Caraveo/Extender: [name] Weeks in Treatment: 1 Encounter Discharge Information Items Discharge Pain Level: 0 Discharge  Condition: Stable Ambulatory Status: Ambulatory Discharge Destination: Home Transportation: Private Auto Accompanied By: self Schedule Follow-up Appointment: Yes Medication Reconciliation completed No and provided to Patient/Care Heiress Williamson: Patient Clinical Summary of Care: Declined Electronic Signature(s) Signed: 05/21/2017 4:23:28 PM By: Alejandro Mulling Entered By: Alejandro Mulling on 05/21/2017 08:47:45 [name] ([id number]) -------------------------------------------------------------------------------- Lower Extremity Assessment Details Patient Name: [name] Date of Service: 05/21/2017 8:00 AM Medical Record Number: [id number] Patient Account Number: [id number] Date of Birth/Sex: Feb 24, 1946 (71 y.o. Male) Treating RN: [name] Primary Care Leul Narramore: [name] Other Clinician: Referring Terressa Evola: [name] Treating Timia Casselman/Extender: [name] Weeks in Treatment: 70 Vascular Assessment Pulses: Dorsalis Pedis Palpable: [Right:Yes] Posterior Tibial Extremity colors, hair growth, and conditions: Extremity Color: [Right:Normal] Temperature of Extremity: [Right:Warm] Capillary Refill: [Right:< 3 seconds] Toe Nail Assessment Left: Right: Thick: Yes Discolored: Yes Deformed: No Improper Length and Hygiene: Yes Electronic Signature(s) Signed: 05/21/2017 4:23:28 PM By: Alejandro Mulling Entered By: Alejandro Mulling on 05/21/2017 08:10:52 [name] ([id number]) -------------------------------------------------------------------------------- Multi Wound Chart Details Patient Name: [name] Date of Service: 05/21/2017 8:00 AM Medical Record Number: [id number] Patient Account Number: [id number] Date of Birth/Sex: October 26, 1945 (72 y.o. Male) Treating RN: Ashok Cordia, [name] Primary Care Jeremaine Maraj: [name] Other Clinician: Referring Capitola Ladson: [name] Treating Burnette Sautter/Extender: [name] Weeks in  Treatment: 70 Vital Signs Height(in): 76 Pulse(bpm): 75 Weight(lbs): 238 Blood Pressure 144/75 (mmHg): Body Mass Index(BMI): 29 Temperature(F): 97.8 Respiratory Rate 18 (breaths/min): Photos: [1:No Photos] [2:No Photos] [N/A:N/A] Wound Location: [1:Right Malleolus - Lateral] [2:Right Malleolus - Medial] [N/A:N/A] Wounding Event: [1:Gradually Appeared] [2:Gradually Appeared] [N/A:N/A] Primary Etiology: [1:Venous Leg Ulcer] [2:Venous Leg Ulcer] [N/A:N/A] Comorbid History: [1:Cataracts, Chronic Obstructive Pulmonary Disease (COPD), Osteoarthritis] [2:Cataracts, Chronic Obstructive Pulmonary Disease (COPD), Osteoarthritis] [N/A:N/A] Date Acquired: [1:08/11/2015] [2:01/30/2016] [N/A:N/A] Weeks of Treatment: [1:70] [2:68] [N/A:N/A] Wound Status: [1:Open] [2:Open] [N/A:N/A] Clustered Wound: [1:No] [2:Yes] [N/A:N/A] Measurements L x W x D 4.5x3.5x0.2 [2:3x2.5x0.1] [N/A:N/A] (cm) Area (cm) : [1:12.37] [2:5.89] [N/A:N/A] Volume (cm) : [1:2.474] [2:0.589] [N/A:N/A] % Reduction in Area: [1:28.40%] [  2:49.00%] [N/A:N/A] % Reduction in Volume: 52.30% [2:49.00%] [N/A:N/A] Classification: [1:Full Thickness Without Exposed Support Structures] [2:Full Thickness Without Exposed Support Structures] [N/A:N/A] Exudate Amount: [1:Large] [2:Large] [N/A:N/A] Exudate Type: [1:Serous] [2:Serous] [N/A:N/A] Exudate Color: [1:amber] [2:amber] [N/A:N/A] Wound Margin: [1:Distinct, outline attached] [2:Distinct, outline attached] [N/A:N/A] Granulation Amount: [1:None Present (0%)] [2:None Present (0%)] [N/A:N/A] Necrotic Amount: [1:Large (67-100%)] [2:Large (67-100%)] [N/A:N/A] Exposed Structures: [1:Fat Layer (Subcutaneous Tissue) Exposed: Yes Fascia: No] [2:Fat Layer (Subcutaneous Tissue) Exposed: Yes Fascia: No] [N/A:N/A] Tendon: No Tendon: No Muscle: No Muscle: No Joint: No Joint: No Bone: No Bone: No Epithelialization: Small (1-33%) Small (1-33%) N/A Debridement: Debridement (69629- Debridement  (52841- N/A 11047) 11047) Pre-procedure 08:15 08:15 N/A Verification/Time Out Taken: Pain Control: Lidocaine 4% Topical Lidocaine 4% Topical N/A Solution Solution Tissue Debrided: Fibrin/Slough, Exudates, Fibrin/Slough, Exudates, N/A Subcutaneous Subcutaneous Level: Skin/Subcutaneous Skin/Subcutaneous N/A Tissue Tissue Debridement Area (sq 15.75 7.5 N/A cm): Instrument: Other(scoop) Other(scoop) N/A Bleeding: Minimum Minimum N/A Hemostasis Achieved: Pressure Pressure N/A Procedural Pain: 0 0 N/A Post Procedural Pain: 0 0 N/A Debridement Treatment Procedure was tolerated Procedure was tolerated N/A Response: well well Post Debridement 4.5x3.5x0.3 3x2.5x0.2 N/A Measurements L x W x D (cm) Post Debridement 3.711 1.178 N/A Volume: (cm) Periwound Skin Texture: Scarring: Yes Scarring: Yes N/A Excoriation: No Excoriation: No Induration: No Induration: No Callus: No Callus: No Crepitus: No Crepitus: No Rash: No Rash: No Periwound Skin Maceration: Yes Maceration: Yes N/A Moisture: Dry/Scaly: No Dry/Scaly: No Periwound Skin Color: Hemosiderin Staining: Yes Hemosiderin Staining: Yes N/A Atrophie Blanche: No Atrophie Blanche: No Cyanosis: No Cyanosis: No Ecchymosis: No Ecchymosis: No Erythema: No Erythema: No Mottled: No Mottled: No Pallor: No Pallor: No Rubor: No Rubor: No Temperature: No Abnormality No Abnormality N/A Tenderness on Yes Yes N/A Palpation: Wound Preparation: N/A SANTOSH, PETTER ([id number]) Ulcer Cleansing: Ulcer Cleansing: Rinsed/Irrigated with Rinsed/Irrigated with Saline Saline Topical Anesthetic Topical Anesthetic Applied: Other: lidocaine Applied: Other: lidocaine 4% 4% Procedures Performed: Debridement Debridement N/A Treatment Notes Electronic Signature(s) Signed: 05/21/2017 4:14:11 PM By: [name] MD Entered By: [name] on 05/21/2017 08:25:24 [name]  ([id number]) -------------------------------------------------------------------------------- Multi-Disciplinary Care Plan Details Patient Name: [name] Date of Service: 05/21/2017 8:00 AM Medical Record Number: [id number] Patient Account Number: [id number] Date of Birth/Sex: 27-Dec-1945 (71 y.o. Male) Treating RN: [name] Primary Care Waylon Koffler: [name] Other Clinician: Referring Barett Whidbee: [name] Treating Yanai Hobson/Extender: [name] Weeks in Treatment: 70 Active Inactive ` Venous Leg Ulcer Nursing Diagnoses: Knowledge deficit related to disease process and management Goals: Patient will maintain optimal edema control Date Initiated: 04/03/2016 Target Resolution Date: 11/24/2016 Goal Status: Active Interventions: Compression as ordered Treatment Activities: Therapeutic compression applied : 04/03/2016 Notes: ` Wound/Skin Impairment Nursing Diagnoses: Impaired tissue integrity Goals: Ulcer/skin breakdown will heal within 14 weeks Date Initiated: 01/17/2016 Target Resolution Date: 11/24/2016 Goal Status: Active Interventions: Assess ulceration(s) every visit Notes: Electronic Signature(s) Signed: 05/21/2017 4:23:28 PM By: Aurelio Jew, [name] ([id number]) Entered By: Alejandro Mulling on 05/21/2017 08:12:47 [name] ([id number]) -------------------------------------------------------------------------------- Pain Assessment Details Patient Name: [name] Date of Service: 05/21/2017 8:00 AM Medical Record Number: [id number] Patient Account Number: [id number] Date of Birth/Sex: [birth year] (71 y.o. Male) Treating RN: [name] Primary Care Tonnia Bardin: [name] Other Clinician: Referring Juliyah Mergen: [name] Treating Peregrine Nolt/Extender: [name] Weeks in Treatment: 70 Active Problems Location of Pain Severity and Description of Pain Patient Has Paino No Site Locations Pain Management  and Medication Current Pain Management: Electronic Signature(s) Signed: 05/21/2017 4:23:28 PM By: Ashok Cordia,  Debra Entered By: Alejandro Mulling on 05/21/2017 08:01:56 [name] ([id number]) -------------------------------------------------------------------------------- Patient/Caregiver Education Details [name] Date of Service: 05/21/2017 8:00 AM Patient Name: C. Patient Account Number: [id number] Medical Record Treating RN: [name] [id number] Number: Other Clinician: Date of Birth/Gender: [birth year] (71 y.o. Male) Treating [name], [name] Primary Care Physician: [name] Physician/Extender: G Referring Physician: [name] in Treatment: 10 Education Assessment Education Provided To: Patient Education Topics Provided Wound/Skin Impairment: Handouts: Other: change dressing as ordered Methods: Demonstration, Explain/Verbal Responses: State content correctly Electronic Signature(s) Signed: 05/21/2017 4:23:28 PM By: Alejandro Mulling Entered By: Alejandro Mulling on 05/21/2017 08:47:58 Lucena, [name] ([id number]) -------------------------------------------------------------------------------- Wound Assessment Details Patient Name: [name] Date of Service: 05/21/2017 8:00 AM Medical Record Number: [id number] Patient Account Number: [id number] Date of Birth/Sex: 06/07/1946 (71 y.o. Male) Treating RN: Ashok Cordia, [name] Primary Care Melvyn Hommes: [name] Other Clinician: Referring Myla Mauriello: [name] Treating Meredith Mells/Extender: [name] Weeks in Treatment: 70 Wound Status Wound Number: 1 Primary Venous Leg Ulcer Etiology: Wound Location: Right Malleolus - Lateral Wound Open Wounding Event: Gradually Appeared Status: Date Acquired: 08/11/2015 Comorbid Cataracts, Chronic Obstructive Weeks Of Treatment: 70 History: Pulmonary Disease (COPD), Clustered Wound: No Osteoarthritis Photos Photo Uploaded By:  Alejandro Mulling on 05/21/2017 11:52:26 Wound Measurements Length: (cm) 4.5 Width: (cm) 3.5 Depth: (cm) 0.2 Area: (cm) 12.37 Volume: (cm) 2.474 % Reduction in Area: 28.4% % Reduction in Volume: 52.3% Epithelialization: Small (1-33%) Tunneling: No Undermining: No Wound Description Full Thickness Without Exposed Foul Odor After Classification: Support Structures Slough/Fibrino Wound Margin: Distinct, outline attached Exudate Large Amount: Exudate Type: Serous Exudate Color: amber Cleansing: No Yes Wound Bed Granulation Amount: None Present (0%) Exposed Structure Necrotic Amount: Large (67-100%) Fascia Exposed: No Stecklein, Kmari C. ([id number]) Necrotic Quality: Adherent Slough Fat Layer (Subcutaneous Tissue) Exposed: Yes Tendon Exposed: No Muscle Exposed: No Joint Exposed: No Bone Exposed: No Periwound Skin Texture Texture Color No Abnormalities Noted: No No Abnormalities Noted: No Callus: No Atrophie Blanche: No Crepitus: No Cyanosis: No Excoriation: No Ecchymosis: No Induration: No Erythema: No Rash: No Hemosiderin Staining: Yes Scarring: Yes Mottled: No Pallor: No Moisture Rubor: No No Abnormalities Noted: No Dry / Scaly: No Temperature / Pain Maceration: Yes Temperature: No Abnormality Tenderness on Palpation: Yes Wound Preparation Ulcer Cleansing: Rinsed/Irrigated with Saline Topical Anesthetic Applied: Other: lidocaine 4%, Treatment Notes Wound #1 (Right, Lateral Malleolus) 1. Cleansed with: Clean wound with Normal Saline 2. Anesthetic Topical Lidocaine 4% cream to wound bed prior to debridement 4. Dressing Applied: Other dressing (specify in notes) 5. Secondary Dressing Applied ABD Pad Dry Gauze Kerlix/Conform 7. Secured with Tape Notes anasept gel, drawtex, cutimed sorbact Electronic Signature(s) Signed: 05/21/2017 4:23:28 PM By: Alejandro Mulling Entered By: Alejandro Mulling on 05/21/2017 08:10:27 [name]  ([id number]) [name], [name] ([id number]) -------------------------------------------------------------------------------- Wound Assessment Details Patient Name: [name] Date of Service: 05/21/2017 8:00 AM Medical Record Number: [id number] Patient Account Number: [id number] Date of Birth/Sex: [birth year] (71 y.o. Male) Treating RN: [name] Primary Care Ryiah Bellissimo: [name] Other Clinician: Referring Chin Wachter: [name] Treating Aycen Porreca/Extender: [name] Weeks in Treatment: 70 Wound Status Wound Number: 2 Primary Venous Leg Ulcer Etiology: Wound Location: Right Malleolus - Medial Wound Open Wounding Event: Gradually Appeared Status: Date Acquired: 01/30/2016 Comorbid Cataracts, Chronic Obstructive Weeks Of Treatment: 68 History: Pulmonary Disease (COPD), Clustered Wound: Yes Osteoarthritis Photos Photo Uploaded By: Alejandro Mulling on 05/21/2017 11:52:26 Wound Measurements Length: (cm) 3 Width: (cm) 2.5 Depth: (cm) 0.1 Area: (cm) 5.89 Volume: (cm) 0.589 %  Reduction in Area: 49% % Reduction in Volume: 49% Epithelialization: Small (1-33%) Tunneling: No Undermining: No Wound Description Full Thickness Without Exposed Foul Odor After Classification: Support Structures Slough/Fibrino Wound Margin: Distinct, outline attached Exudate Large Amount: Exudate Type: Serous Exudate Color: amber Cleansing: No Yes Wound Bed Granulation Amount: None Present (0%) Exposed Structure Necrotic Amount: Large (67-100%) Fascia Exposed: No Sydnor, Oluwatobi C. ([id number]) Necrotic Quality: Adherent Slough Fat Layer (Subcutaneous Tissue) Exposed: Yes Tendon Exposed: No Muscle Exposed: No Joint Exposed: No Bone Exposed: No Periwound Skin Texture Texture Color No Abnormalities Noted: No No Abnormalities Noted: No Callus: No Atrophie Blanche: No Crepitus: No Cyanosis: No Excoriation: No Ecchymosis: No Induration: No Erythema: No Rash:  No Hemosiderin Staining: Yes Scarring: Yes Mottled: No Pallor: No Moisture Rubor: No No Abnormalities Noted: No Dry / Scaly: No Temperature / Pain Maceration: Yes Temperature: No Abnormality Tenderness on Palpation: Yes Wound Preparation Ulcer Cleansing: Rinsed/Irrigated with Saline Topical Anesthetic Applied: Other: lidocaine 4%, Treatment Notes Wound #2 (Right, Medial Malleolus) 1. Cleansed with: Clean wound with Normal Saline 2. Anesthetic Topical Lidocaine 4% cream to wound bed prior to debridement 4. Dressing Applied: Other dressing (specify in notes) 5. Secondary Dressing Applied ABD Pad Dry Gauze Kerlix/Conform 7. Secured with Tape Notes anasept gel, drawtex, cutimed sorbact Electronic Signature(s) Signed: 05/21/2017 4:23:28 PM By: Alejandro Mulling Entered By: Alejandro Mulling on 05/21/2017 08:08:35 [name] ([id number]) [name], [name] ([id number]) -------------------------------------------------------------------------------- Vitals Details Patient Name: [name] Date of Service: 05/21/2017 8:00 AM Medical Record Number: [id number] Patient Account Number: [id number] Date of Birth/Sex: [birth year] (71 y.o. Male) Treating RN: Ashok Cordia, [name] Primary Care Chae Oommen: [name] Other Clinician: Referring Dorean Daniello: [name] Treating Krystal Teachey/Extender: [name] Weeks in Treatment: 70 Vital Signs Time Taken: 08:01 Temperature (F): 97.8 Height (in): 76 Pulse (bpm): 75 Weight (lbs): 238 Respiratory Rate (breaths/min): 18 Body Mass Index (BMI): 29 Blood Pressure (mmHg): 144/75 Reference Range: 80 - 120 mg / dl Electronic Signature(s) Signed: 05/21/2017 4:23:28 PM By: Alejandro Mulling Entered By: Alejandro Mulling on 05/21/2017 62:95:28

## 2017-05-29 ENCOUNTER — Encounter: Payer: Medicare HMO | Attending: Internal Medicine | Admitting: Internal Medicine

## 2017-05-29 DIAGNOSIS — T8131XD Disruption of external operation (surgical) wound, not elsewhere classified, subsequent encounter: Secondary | ICD-10-CM | POA: Insufficient documentation

## 2017-05-29 DIAGNOSIS — L97212 Non-pressure chronic ulcer of right calf with fat layer exposed: Secondary | ICD-10-CM | POA: Diagnosis not present

## 2017-05-29 DIAGNOSIS — Y832 Surgical operation with anastomosis, bypass or graft as the cause of abnormal reaction of the patient, or of later complication, without mention of misadventure at the time of the procedure: Secondary | ICD-10-CM | POA: Diagnosis not present

## 2017-05-29 DIAGNOSIS — Z86718 Personal history of other venous thrombosis and embolism: Secondary | ICD-10-CM | POA: Insufficient documentation

## 2017-05-29 DIAGNOSIS — Z7901 Long term (current) use of anticoagulants: Secondary | ICD-10-CM | POA: Insufficient documentation

## 2017-05-29 DIAGNOSIS — I87331 Chronic venous hypertension (idiopathic) with ulcer and inflammation of right lower extremity: Secondary | ICD-10-CM | POA: Insufficient documentation

## 2017-05-29 DIAGNOSIS — T85613A 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, initial encounter: Secondary | ICD-10-CM | POA: Diagnosis not present

## 2017-05-29 DIAGNOSIS — L97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 of skin: Secondary | ICD-10-CM | POA: Diagnosis not present

## 2017-05-30 NOTE — Progress Notes (Signed)
[name], [name] ([id number]) Visit Report for 05/29/2017 Arrival Information Details Patient Name: [name], [name]. Date of Service: 05/29/2017 8:00 AM Medical Record Number: [id number] Patient Account Number: [id number] Date of Birth/Sex: 08-Sep-1945 (71 y.o. Male) Treating RN: [name] Primary Care Denielle Bayard: [name] Other Clinician: Referring Rune Mendez: [name] Treating Ebonie Westerlund/Extender: Altamese Kendleton in Treatment: 71 Visit Information History Since Last Visit All ordered tests and consults were completed: No Patient Arrived: Ambulatory Added or deleted any medications: No Arrival Time: 08:03 Any new allergies or adverse reactions: No Accompanied By: self Had a fall or experienced change in No Transfer Assistance: None activities of daily living that may affect Patient Identification Verified: Yes risk of falls: Secondary Verification Process Yes Signs or symptoms of abuse/neglect since last No Completed: visito Patient Requires Transmission- No Hospitalized since last visit: No Based Precautions: Has Dressing in Place as Prescribed: Yes Patient Has Alerts: Yes Pain Present Now: No Patient Alerts: Patient on Blood Thinner Electronic Signature(s) Signed: 05/29/2017 4:20:40 PM By: Alejandro Mulling Entered By: Alejandro Mulling on 05/29/2017 08:04:17 [name] ([id number]) -------------------------------------------------------------------------------- Encounter Discharge Information Details Patient Name: [name] Date of Service: 05/29/2017 8:00 AM Medical Record Number: [id number] Patient Account Number: [id number] Date of Birth/Sex: September 03, 1945 (71 y.o. Male) Treating RN: [name] Primary Care Jeannifer Drakeford: [name] Other Clinician: Referring Vail Vuncannon: [name] Treating Fallon Haecker/Extender: [name] Weeks in Treatment: 69 Encounter Discharge Information Items Discharge Pain Level: 0 Discharge  Condition: Stable Ambulatory Status: Ambulatory Discharge Destination: Home Transportation: Private Auto Accompanied By: self Schedule Follow-up Appointment: Yes Medication Reconciliation completed No and provided to Patient/Care Clatie Kessen: Patient Clinical Summary of Care: Declined Electronic Signature(s) Signed: 05/29/2017 4:17:02 PM By: Gwenlyn Perking Entered By: Gwenlyn Perking on 05/29/2017 08:42:09 [name] ([id number]) -------------------------------------------------------------------------------- Lower Extremity Assessment Details Patient Name: [name] Date of Service: 05/29/2017 8:00 AM Medical Record Number: [id number] Patient Account Number: [id number] Date of Birth/Sex: 11-05-1945 (71 y.o. Male) Treating RN: [name] Primary Care Edith Groleau: [name] Other Clinician: Referring Lonetta Blassingame: [name] Treating Gavinn Collard/Extender: [name] Weeks in Treatment: 56 Vascular Assessment Pulses: Dorsalis Pedis Palpable: [Right:Yes] Posterior Tibial Extremity colors, hair growth, and conditions: Extremity Color: [Right:Normal] Temperature of Extremity: [Right:Warm] Capillary Refill: [Right:< 3 seconds] Toe Nail Assessment Left: Right: Thick: Yes Discolored: Yes Deformed: No Improper Length and Hygiene: Yes Electronic Signature(s) Signed: 05/29/2017 4:20:40 PM By: Alejandro Mulling Entered By: Alejandro Mulling on 05/29/2017 08:14:33 [name] ([id number]) -------------------------------------------------------------------------------- Multi Wound Chart Details Patient Name: [name] Date of Service: 05/29/2017 8:00 AM Medical Record Number: [id number] Patient Account Number: [id number] Date of Birth/Sex: 1946-05-18 (71 y.o. Male) Treating RN: Ashok Cordia, [name] Primary Care Cyrene Gharibian: [name] Other Clinician: Referring Ariele Vidrio: [name] Treating Shahid Flori/Extender: [name] Weeks in Treatment:  71 Vital Signs Height(in): 76 Pulse(bpm): 73 Weight(lbs): 238 Blood Pressure 135/66 (mmHg): Body Mass Index(BMI): 29 Temperature(F): 98.4 Respiratory Rate 18 (breaths/min): Photos: [1:No Photos] [2:No Photos] [N/A:N/A] Wound Location: [1:Right Malleolus - Lateral] [2:Right Malleolus - Medial] [N/A:N/A] Wounding Event: [1:Gradually Appeared] [2:Gradually Appeared] [N/A:N/A] Primary Etiology: [1:Venous Leg Ulcer] [2:Venous Leg Ulcer] [N/A:N/A] Comorbid History: [1:Cataracts, Chronic Obstructive Pulmonary Disease (COPD), Osteoarthritis] [2:Cataracts, Chronic Obstructive Pulmonary Disease (COPD), Osteoarthritis] [N/A:N/A] Date Acquired: [1:08/11/2015] [2:01/30/2016] [N/A:N/A] Weeks of Treatment: [1:71] [2:69] [N/A:N/A] Wound Status: [1:Open] [2:Open] [N/A:N/A] Clustered Wound: [1:No] [2:Yes] [N/A:N/A] Measurements L x W x D 4.5x3.5x0.2 [2:3x2.8x0.1] [N/A:N/A] (cm) Area (cm) : [1:12.37] [2:6.597] [N/A:N/A] Volume (cm) : [1:2.474] [2:0.66] [N/A:N/A] % Reduction in Area: [1:28.40%] [  2:42.90%] [N/A:N/A] % Reduction in Volume: 52.30% [2:42.90%] [N/A:N/A] Classification: [1:Full Thickness Without Exposed Support Structures] [2:Full Thickness Without Exposed Support Structures] [N/A:N/A] Exudate Amount: [1:Large] [2:Large] [N/A:N/A] Exudate Type: [1:Serous] [2:Serous] [N/A:N/A] Exudate Color: [1:amber] [2:amber] [N/A:N/A] Wound Margin: [1:Distinct, outline attached] [2:Distinct, outline attached] [N/A:N/A] Granulation Amount: [1:None Present (0%)] [2:None Present (0%)] [N/A:N/A] Necrotic Amount: [1:Large (67-100%)] [2:Large (67-100%)] [N/A:N/A] Exposed Structures: [1:Fat Layer (Subcutaneous Tissue) Exposed: Yes Fascia: No] [2:Fat Layer (Subcutaneous Tissue) Exposed: Yes Fascia: No] [N/A:N/A] Tendon: No Tendon: No Muscle: No Muscle: No Joint: No Joint: No Bone: No Bone: No Epithelialization: Small (1-33%) Small (1-33%) N/A Debridement: Debridement (16109- Debridement (60454-  N/A 11047) 11047) Pre-procedure 08:24 08:24 N/A Verification/Time Out Taken: Pain Control: Lidocaine 4% Topical Lidocaine 4% Topical N/A Solution Solution Tissue Debrided: Fibrin/Slough, Exudates, Fibrin/Slough, Exudates, N/A Subcutaneous Subcutaneous Level: Skin/Subcutaneous Skin/Subcutaneous N/A Tissue Tissue Debridement Area (sq 15.75 8.4 N/A cm): Instrument: Curette Curette N/A Bleeding: Minimum Minimum N/A Hemostasis Achieved: Pressure Pressure N/A Procedural Pain: 0 0 N/A Post Procedural Pain: 0 0 N/A Debridement Treatment Procedure was tolerated Procedure was tolerated N/A Response: well well Post Debridement 4.5x3.5x0.3 3x2.8x0.2 N/A Measurements L x W x D (cm) Post Debridement 3.711 1.319 N/A Volume: (cm) Periwound Skin Texture: Scarring: Yes Scarring: Yes N/A Excoriation: No Excoriation: No Induration: No Induration: No Callus: No Callus: No Crepitus: No Crepitus: No Rash: No Rash: No Periwound Skin Maceration: Yes Maceration: Yes N/A Moisture: Dry/Scaly: No Dry/Scaly: No Periwound Skin Color: Hemosiderin Staining: Yes Hemosiderin Staining: Yes N/A Atrophie Blanche: No Atrophie Blanche: No Cyanosis: No Cyanosis: No Ecchymosis: No Ecchymosis: No Erythema: No Erythema: No Mottled: No Mottled: No Pallor: No Pallor: No Rubor: No Rubor: No Temperature: No Abnormality No Abnormality N/A Tenderness on Yes Yes N/A Palpation: Wound Preparation: N/A MEIR, ELWOOD ([id number]) Ulcer Cleansing: Ulcer Cleansing: Rinsed/Irrigated with Rinsed/Irrigated with Saline Saline Topical Anesthetic Topical Anesthetic Applied: Other: lidocaine Applied: Other: lidocaine 4% 4% Procedures Performed: Debridement Debridement N/A Treatment Notes Electronic Signature(s) Signed: 05/29/2017 4:23:55 PM By: [name] MD Entered By: [name] on 05/29/2017 08:33:19 [name]  ([id number]) -------------------------------------------------------------------------------- Multi-Disciplinary Care Plan Details Patient Name: [name] Date of Service: 05/29/2017 8:00 AM Medical Record Number: [id number] Patient Account Number: [id number] Date of Birth/Sex: [birth year] (72 y.o. Male) Treating RN: [name] Primary Care Carrera Kiesel: [name] Other Clinician: Referring Shaquna Geigle: [name] Treating Amberlie Gaillard/Extender: [name] Weeks in Treatment: 20 Active Inactive ` Venous Leg Ulcer Nursing Diagnoses: Knowledge deficit related to disease process and management Goals: Patient will maintain optimal edema control Date Initiated: 04/03/2016 Target Resolution Date: 11/24/2016 Goal Status: Active Interventions: Compression as ordered Treatment Activities: Therapeutic compression applied : 04/03/2016 Notes: ` Wound/Skin Impairment Nursing Diagnoses: Impaired tissue integrity Goals: Ulcer/skin breakdown will heal within 14 weeks Date Initiated: 01/17/2016 Target Resolution Date: 11/24/2016 Goal Status: Active Interventions: Assess ulceration(s) every visit Notes: Electronic Signature(s) Signed: 05/29/2017 4:20:40 PM By: Aurelio Jew, [name] ([id number]) Entered By: Alejandro Mulling on 05/29/2017 08:19:36 [name] ([id number]) -------------------------------------------------------------------------------- Pain Assessment Details Patient Name: [name] Date of Service: 05/29/2017 8:00 AM Medical Record Number: [id number] Patient Account Number: [id number] Date of Birth/Sex: 01-Mar-1946 (71 y.o. Male) Treating RN: [name] Primary Care Julieth Tugman: [name] Other Clinician: Referring Jinnifer Montejano: [name] Treating Annye Forrey/Extender: [name] Weeks in Treatment: 71 Active Problems Location of Pain Severity and Description of Pain Patient Has Paino No Site Locations Pain Management  and Medication Current Pain Management: Electronic Signature(s) Signed: 05/29/2017 4:20:40 PM By: Ashok Cordia,  Debra Entered By: Alejandro Mulling on 05/29/2017 08:04:28 [name] ([id number]) -------------------------------------------------------------------------------- Patient/Caregiver Education Details [name] Date of Service: 05/29/2017 8:00 AM Patient Name: C. Patient Account Number: [id number] Medical Record Treating RN: [name] [id number] Number: Other Clinician: Date of Birth/Gender: 05-13-1946 (71 y.o. Male) Treating [name], [name] Primary Care Physician: [name] Physician/Extender: G Referring Physician: [name] in Treatment: 80 Education Assessment Education Provided To: Patient Education Topics Provided Wound/Skin Impairment: Handouts: Other: change dressing as ordered Methods: Demonstration, Explain/Verbal Responses: State content correctly Electronic Signature(s) Signed: 05/29/2017 4:20:40 PM By: Alejandro Mulling Entered By: Alejandro Mulling on 05/29/2017 08:22:21 [name] ([id number]) -------------------------------------------------------------------------------- Wound Assessment Details Patient Name: [name] Date of Service: 05/29/2017 8:00 AM Medical Record Number: [id number] Patient Account Number: [id number] Date of Birth/Sex: Nov 25, 1945 (71 y.o. Male) Treating RN: Ashok Cordia, [name] Primary Care Kiaria Quinnell: [name] Other Clinician: Referring Megann Easterwood: [name] Treating Billye Nydam/Extender: [name] Weeks in Treatment: 71 Wound Status Wound Number: 1 Primary Venous Leg Ulcer Etiology: Wound Location: Right Malleolus - Lateral Wound Open Wounding Event: Gradually Appeared Status: Date Acquired: 08/11/2015 Comorbid Cataracts, Chronic Obstructive Weeks Of Treatment: 71 History: Pulmonary Disease (COPD), Clustered Wound: No Osteoarthritis Photos Photo Uploaded By:  Alejandro Mulling on 05/29/2017 12:36:13 Wound Measurements Length: (cm) 4.5 Width: (cm) 3.5 Depth: (cm) 0.2 Area: (cm) 12.37 Volume: (cm) 2.474 % Reduction in Area: 28.4% % Reduction in Volume: 52.3% Epithelialization: Small (1-33%) Tunneling: No Undermining: No Wound Description Full Thickness Without Exposed Foul Odor After Classification: Support Structures Slough/Fibrino Wound Margin: Distinct, outline attached Exudate Large Amount: Exudate Type: Serous Exudate Color: amber Cleansing: No Yes Wound Bed Granulation Amount: None Present (0%) Exposed Structure Necrotic Amount: Large (67-100%) Fascia Exposed: No Tinoco, Harsha C. ([id number]) Necrotic Quality: Adherent Slough Fat Layer (Subcutaneous Tissue) Exposed: Yes Tendon Exposed: No Muscle Exposed: No Joint Exposed: No Bone Exposed: No Periwound Skin Texture Texture Color No Abnormalities Noted: No No Abnormalities Noted: No Callus: No Atrophie Blanche: No Crepitus: No Cyanosis: No Excoriation: No Ecchymosis: No Induration: No Erythema: No Rash: No Hemosiderin Staining: Yes Scarring: Yes Mottled: No Pallor: No Moisture Rubor: No No Abnormalities Noted: No Dry / Scaly: No Temperature / Pain Maceration: Yes Temperature: No Abnormality Tenderness on Palpation: Yes Wound Preparation Ulcer Cleansing: Rinsed/Irrigated with Saline Topical Anesthetic Applied: Other: lidocaine 4%, Treatment Notes Wound #1 (Right, Lateral Malleolus) 1. Cleansed with: Clean wound with Normal Saline 2. Anesthetic Topical Lidocaine 4% cream to wound bed prior to debridement 4. Dressing Applied: Other dressing (specify in notes) 5. Secondary Dressing Applied ABD Pad Dry Gauze Kerlix/Conform Notes cutimed sorbact, anasept gel, drawtex Electronic Signature(s) Signed: 05/29/2017 4:20:40 PM By: Alejandro Mulling Entered By: Alejandro Mulling on 05/29/2017 08:11:53 [name]  ([id number]) -------------------------------------------------------------------------------- Wound Assessment Details Patient Name: [name] Date of Service: 05/29/2017 8:00 AM Medical Record Number: [id number] Patient Account Number: [id number] Date of Birth/Sex: 15-Apr-1946 (71 y.o. Male) Treating RN: Ashok Cordia, [name] Primary Care Kaniel Kiang: [name] Other Clinician: Referring Roseline Ebarb: [name] Treating Vienna Folden/Extender: [name] Weeks in Treatment: 71 Wound Status Wound Number: 2 Primary Venous Leg Ulcer Etiology: Wound Location: Right Malleolus - Medial Wound Open Wounding Event: Gradually Appeared Status: Date Acquired: 01/30/2016 Comorbid Cataracts, Chronic Obstructive Weeks Of Treatment: 69 History: Pulmonary Disease (COPD), Clustered Wound: Yes Osteoarthritis Photos Photo Uploaded By: Alejandro Mulling on 05/29/2017 12:36:14 Wound Measurements Length: (cm) 3 Width: (cm) 2.8 Depth: (cm) 0.1 Area: (cm) 6.597 Volume: (cm) 0.66 % Reduction in Area: 42.9% % Reduction in  Volume: 42.9% Epithelialization: Small (1-33%) Tunneling: No Undermining: No Wound Description Full Thickness Without Exposed Foul Odor After Classification: Support Structures Slough/Fibrino Wound Margin: Distinct, outline attached Exudate Large Amount: Exudate Type: Serous Exudate Color: amber Cleansing: No Yes Wound Bed Granulation Amount: None Present (0%) Exposed Structure Necrotic Amount: Large (67-100%) Fascia Exposed: No Melchor, Vestal C. ([id number]) Necrotic Quality: Adherent Slough Fat Layer (Subcutaneous Tissue) Exposed: Yes Tendon Exposed: No Muscle Exposed: No Joint Exposed: No Bone Exposed: No Periwound Skin Texture Texture Color No Abnormalities Noted: No No Abnormalities Noted: No Callus: No Atrophie Blanche: No Crepitus: No Cyanosis: No Excoriation: No Ecchymosis: No Induration: No Erythema: No Rash: No Hemosiderin Staining:  Yes Scarring: Yes Mottled: No Pallor: No Moisture Rubor: No No Abnormalities Noted: No Dry / Scaly: No Temperature / Pain Maceration: Yes Temperature: No Abnormality Tenderness on Palpation: Yes Wound Preparation Ulcer Cleansing: Rinsed/Irrigated with Saline Topical Anesthetic Applied: Other: lidocaine 4%, Treatment Notes Wound #2 (Right, Medial Malleolus) 1. Cleansed with: Clean wound with Normal Saline 2. Anesthetic Topical Lidocaine 4% cream to wound bed prior to debridement 4. Dressing Applied: Other dressing (specify in notes) 5. Secondary Dressing Applied ABD Pad Dry Gauze Kerlix/Conform Notes cutimed sorbact, anasept gel, drawtex Electronic Signature(s) Signed: 05/29/2017 4:20:40 PM By: Alejandro Mulling Entered By: Alejandro Mulling on 05/29/2017 08:12:16 [name] ([id number]) -------------------------------------------------------------------------------- Vitals Details Patient Name: [name] Date of Service: 05/29/2017 8:00 AM Medical Record Number: [id number] Patient Account Number: [id number] Date of Birth/Sex: 02/03/1946 (71 y.o. Male) Treating RN: Ashok Cordia, [name] Primary Care Amarii Amy: [name] Other Clinician: Referring Tesslyn Baumert: [name] Treating Yaneliz Radebaugh/Extender: [name] Weeks in Treatment: 71 Vital Signs Time Taken: 08:04 Temperature (F): 98.4 Height (in): 76 Pulse (bpm): 73 Weight (lbs): 238 Respiratory Rate (breaths/min): 18 Body Mass Index (BMI): 29 Blood Pressure (mmHg): 135/66 Reference Range: 80 - 120 mg / dl Electronic Signature(s) Signed: 05/29/2017 4:20:40 PM By: Alejandro Mulling Entered By: Alejandro Mulling on 05/29/2017 08:06:39

## 2017-05-30 NOTE — Progress Notes (Addendum)
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 Apligraf and he has a $290 co-pay per application but truthfully I think the drainage as well as the nonviable surface would preclude use of Apligraf there are any other skin substitute at this point therefore the continued plan will be debridement each clinic visit, 2 times a week dressing  changes and continued use of Hydrofera Blue. improvement has been very slow but sustained 03/26/17 perhaps slight improvements in peripheral epithelialization is especially inferiorly. Still with large amount of drainage and tightly adherent necrotic surface on arrival. Along with the intake nurse I reviewed previous treatment. He worsened on Iodoflex and had a 4 week trial of sorbact, Polymen AG and a long courses of Aquacel Ag. He is not a candidate for advanced treatment options for many reasons 04/09/17 on evaluation today patient's right lower extremity wounds appear to be doing a little better. Fortunately he has no significant discomfort and has been tolerating the dressing changes including the wraps without complication. With that being said he really does not have swelling anymore compared to what he has had in the past following his vascular intervention. He wonders if potentially we could attempt avoiding the rats to see if he could cleanse the wound in between to try to prevent some of the fiber and its buildup that occurs in the interim between when we see him week to week. No fevers, chills, nausea, or vomiting noted at this time. 04/16/17; noted that the staff made a choice last week not to put him in compression. The patient is changing his dressing at home using [facility] and changing every second day. His wife is washing the wound with saline. He is using kerlix. Surprisingly he has not developed a lot of edema. This choice was made because of the degree of fluid retention and maceration even with changing his dressing twice a week 04/23/17; absolutely no change. Using Hydrofera  Blue. Recurrent tightly adherent nonviable surface material. This is been refractory to Iodoflex, sorbact and now Hydrofera Blue. More recently he has been changing his own dressings at home, cleansing the wound in the shower. He has not developed lower extremity edema 04/30/17; if anything the wound is larger still in adherent surface although it debrided easily today. I've been using Mehta honey for 1 week and I'd like to try and do it for a second week this see if we can get some form of viable surface here. 05/07/17; patient arrives with copious amounts of drainage and some pain in the superior part of the wound. He has not been systemically unwell. He's been changing this daily at home 05/14/17; patient arrives today complaining of less drainage and less pain. Dimensions slightly better. Surface culture I did of this last week grew MSSA I put him on dicloxacillin for 10 days. Patient requesting a further prescription since he feels so much better. He did not obtain the [facility] AG for reasons that aren't clear, he has been using Hydrofera Blue 05/21/17; perhaps some less drainage and less pain. He is completing another week of doxycycline. Unfortunately there is no change in the wound measurements are appearance still tightly adherent necrotic surface material that is really defied treatment. We have been using Hydrofera Blue most recently. He did not manage to get Anasept. He was told it was prescription at [facility] 05/29/17; absolutely no improvement in these wound areas. He had remote plastic surgery with who was done at [facility] in [place] surrounding this area. This was a traumatic wound with extensive plastic surgery/skin grafting at that time. I switched him to sorbact last week to see if he can do anything about the recurrent necrotic surface and drainage. This is largely been refractory to Iodoflex/Hydrofera Blue/alginates. I believe we tried Elby Showers and more recently Medi honey l  however the drainage is really too excessive  SARP, VERNIER ([id number]) Objective Constitutional Sitting or standing Blood Pressure is within target range for patient.. Pulse regular and within target range for patient.Marland Kitchen Respirations regular, non-labored and within target range.. Temperature is normal and within the target range for the patient.Marland Kitchen appears in no distress. Vitals Time Taken: 8:04 AM, Height: 76 in, Weight: 238 lbs, BMI: 29, Temperature: 98.4 F, Pulse: 73 bpm, Respiratory Rate: 18 breaths/min, Blood Pressure: 135/66 mmHg. Cardiovascular Pulses are normal on the right. Edema is controlled. General Notes: When exam; absolutely no change in many weeks now. His larger wound is on the right lateral ankle and heel. At one point we managed to have a healthier surface and an [place] of epithelialization developed but we've never been able to get beyond that. Once again tightly adherent necrotic material is aggressively debrided this time with a #5 curet and it is much as the patient couldn't tolerate. Hemostasis with pressure dressings The area on the right medial heel which is smaller and circular in about the same condition. Also debrided. No real improvement in this area either in fact this actually may be slightly larger Integumentary (Hair, Skin) Wound #1 status is Open. Original cause of wound was Gradually Appeared. The wound is located on the Right,Lateral Malleolus. The wound measures 4.5cm length x 3.5cm width x 0.2cm depth; 12.37cm^2 area and 2.474cm^3 volume. There is Fat Layer (Subcutaneous Tissue) Exposed exposed. There is no tunneling or undermining noted. There is a large amount of serous drainage noted. The wound margin is distinct with the outline attached to the wound base. There is no granulation within the wound bed. There is a large (67- 100%) amount of necrotic tissue within the wound bed including Adherent Slough. The periwound skin appearance exhibited:  Scarring, Maceration, Hemosiderin Staining. The periwound skin appearance did not exhibit: Callus, Crepitus, Excoriation, Induration, Rash, Dry/Scaly, Atrophie Blanche, Cyanosis, Ecchymosis, Mottled, Pallor, Rubor, Erythema. Periwound temperature was noted as No Abnormality. The periwound has tenderness on palpation. Wound #2 status is Open. Original cause of wound was Gradually Appeared. The wound is located on the Right,Medial Malleolus. The wound measures 3cm length x 2.8cm width x 0.1cm depth; 6.597cm^2 area and 0.66cm^3 volume. There is Fat Layer (Subcutaneous Tissue) Exposed exposed. There is no tunneling or undermining noted. There is a large amount of serous drainage noted. The wound margin is distinct with the outline attached to the wound base. There is no granulation within the wound bed. There is a large (67- 100%) amount of necrotic tissue within the wound bed including Adherent Slough. The periwound skin appearance exhibited: Scarring, Maceration, Hemosiderin Staining. The periwound skin appearance did not exhibit: Callus, Crepitus, Excoriation, Induration, Rash, Dry/Scaly, Atrophie Blanche, Cyanosis, Ecchymosis, Mottled, Pallor, Rubor, Erythema. Periwound temperature was noted as No Abnormality. The periwound has tenderness on palpation. 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 There was a Skin/Subcutaneous Tissue Debridement ([id number]) debridement  with total area of 15.75 sq cm performed by [name], MD. with the following instrument(s): Curette to remove Viable and Non-Viable tissue/material including Exudate, Fibrin/Slough, and Subcutaneous after achieving pain control using Lidocaine 4% Topical Solution. A time out was conducted at 08:24, prior to the start of the procedure. A Minimum amount of bleeding was controlled with Pressure. The procedure was tolerated well with a pain level of 0 throughout and a pain level of 0 following the procedure. Post Debridement Measurements: 4.5cm length x 3.5cm width x 0.3cm depth; 3.711cm^3 volume. Character of Wound/Ulcer Post Debridement requires further debridement. Severity of Tissue Post Debridement is: Fat layer exposed. 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 There was a Skin/Subcutaneous Tissue Debridement ([id number]) debridement with total area of 8.4 sq cm performed by [name], MD. with the following instrument(s): Curette to remove Viable and Non-Viable tissue/material including Exudate, Fibrin/Slough, and Subcutaneous after achieving pain control using Lidocaine 4% Topical Solution. A time out was conducted at 08:24, prior to the start of the procedure. A Minimum amount of bleeding was controlled with Pressure. The procedure was tolerated well with a pain level of 0 throughout and a pain level of 0 following the procedure. Post Debridement Measurements: 3cm length x 2.8cm width x 0.2cm depth; 1.319cm^3 volume. Character of Wound/Ulcer Post Debridement requires further  debridement. Severity of Tissue Post Debridement is: Fat layer exposed. Post procedure Diagnosis Wound #2: Same as Pre-Procedure Hilger, Cung C. ([id number]) Plan Wound Cleansing: Wound #1 Right,Lateral Malleolus: Clean wound with Normal Saline. Cleanse wound with mild soap and water May Shower, gently pat wound dry prior to applying new dressing. Wound #2 Right,Medial Malleolus: Clean wound with Normal Saline. Cleanse wound with mild soap and water May Shower, gently pat wound dry prior to applying new dressing. Anesthetic: Wound #1 Right,Lateral Malleolus: Topical Lidocaine 4% cream applied to wound bed prior to debridement Wound #2 Right,Medial Malleolus: Topical Lidocaine 4% cream applied to wound bed prior to debridement Skin Barriers/Peri-Wound Care: Wound #1 Right,Lateral Malleolus: Barrier cream Wound #2 Right,Medial Malleolus: Barrier cream Primary Wound Dressing: Wound #1 Right,Lateral Malleolus: Cutimed Sorbact Other: - Anicept Gel Wound #2 Right,Medial Malleolus: Cutimed Sorbact Other: - Anicept Gel Secondary Dressing: Wound #1 Right,Lateral Malleolus: ABD pad Conform/Kerlix Drawtex Wound #2 Right,Medial Malleolus: ABD pad Conform/Kerlix Drawtex Dressing Change Frequency: Wound #1 Right,Lateral Malleolus: Change dressing every day. Wound #2 Right,Medial Malleolus: Change dressing every day. Follow-up Appointments: Wound #1 Right,Lateral Malleolus: MONTRAVIOUS, WEIGELT ([id number]) Return Appointment in 1 week. Wound #2 Right,Medial Malleolus: Return Appointment in 1 week. Edema Control: Wound #1 Right,Lateral Malleolus: Elevate legs to the level of the heart and pump ankles as often as possible Wound #2 Right,Medial Malleolus: Elevate legs to the level of the heart and pump ankles as often as possible Additional Orders / Instructions: Wound #1 Right,Lateral Malleolus: Increase protein intake. Wound #2 Right,Medial Malleolus: Increase protein  intake. Medications-please add to medication list.: Wound #1 Right,Lateral Malleolus: Other: - Vitamin C, Zinc, MVI Wound #2 Right,Medial Malleolus: Other: - Vitamin C, Zinc, MVI Consults ordered were: Plastic Surgery - [place] #1 making absolutely no progress here in spite of a multitude of different dressings. I've never been able to get beyond the necrotic surface material and drainage #2 he did see vascular surgery and had venous ablations I'll need to review this. My  mind says there is not an arterial issue but I'll check into this as well #3 I'm going to refer him to plastic surgery in [facility] either Dr. Drake Leach him or Dr.thamoppa for an opinion on whether he'll be candidate for operative debridement possible skin grafting. I had wanted the surface of these wounds to be better before I consider this but I just don't seem to be able to get to a point where he has a clean granulating base. Post debridement he certainly does however Electronic Signature(s) Signed: 06/10/2017 1:47:56 PM By: Elliot Gurney, BSN, RN, CWS, Kim RN, BSN Signed: 06/12/2017 5:51:56 PM By: [name] MD Previous Signature: 05/29/2017 4:23:55 PM Version By: [name] MD Entered By: Elliot Gurney, BSN, RN, CWS, Kim on 06/10/2017 13:47:56 [name] ([id number]) -------------------------------------------------------------------------------- SuperBill Details [name] Date of Service: 05/29/2017 Patient Name: C. Patient Account Number: [id number] Medical Record Treating RN: [name] [id number] Number: Other Clinician: Date of Birth/Sex: 12/02/1945 (71 y.o. 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, initial T85.613A encounter Disruption of external operation (surgical) wound, not elsewhere classified, subsequent T81.31XD encounter L97.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 of skin Facility Procedures CPT4 Code: [id number] Description: 11042 - DEB SUBQ TISSUE 20 SQ CM/< ICD-10 Description Diagnosis L97.212 Non-pressure chronic ulcer of right calf with fat la Modifier: yer exposed Quantity: 1 CPT4 Code: [id number] Description: 11045 - DEB SUBQ TISS EA ADDL 20CM ICD-10 Description Diagnosis L97.212 Non-pressure chronic ulcer of right calf with fat la Modifier: yer exposed Quantity: 1 Physician Procedures CPT4 Code: [id number] Description: 11042 - WC PHYS SUBQ TISS 20 SQ CM ICD-10 Description Diagnosis L97.212 Non-pressure chronic ulcer of right calf with fat la Modifier: yer exposed Quantity: 1 CPT4 Code: [id number] Greenfeld, L Description: 11045 - WC PHYS SUBQ TISS EA ADDL 20 CM Description Tillman Abide ([id number]) Modifier: Quantity: 1 Electronic Signature(s) Signed: 05/29/2017 4:23:55 PM By: [name] MD Entered By: [name] on 05/29/2017 08:40:20

## 2017-06-04 ENCOUNTER — Encounter: Payer: Medicare HMO | Admitting: Internal Medicine

## 2017-06-04 DIAGNOSIS — I87331 Chronic venous hypertension (idiopathic) with ulcer and inflammation of right lower extremity: Secondary | ICD-10-CM | POA: Diagnosis not present

## 2017-06-05 NOTE — Progress Notes (Signed)
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 better. Surface culture I did of this last week grew MSSA I put him on dicloxacillin for 10 days. Patient  requesting a further prescription since he feels so much better. He did not obtain the [facility] for reasons that aren't clear, he has been using Hydrofera Blue 05/21/17; perhaps some less drainage and less pain. He is completing another week of doxycycline. Unfortunately there is no change in the wound measurements are appearance still tightly adherent necrotic surface material that is really defied treatment. We have been using Hydrofera Blue most recently. He did not manage to get Anasept. He was told it was prescription at [facility] 05/29/17; absolutely no improvement in these wound areas. He had remote plastic surgery with who was done at [facility] in [place] surrounding this area. This was a traumatic wound with extensive plastic surgery/skin grafting at that time. I switched him to sorbact last week to see if he can do anything about the recurrent necrotic surface and drainage. This is largely been refractory to Iodoflex/Hydrofera Blue/alginates. I believe we tried Elby Showers and more recently Medi honey l however the drainage is really too excessive 06/04/17; patient went to see Dr. Ulice Bold. Per the patient she ordered him a compression stocking. Continued the Anasept gel and sorbact was already on. No major improvement in the condition of the wounds in fact the medial wound looks larger. Again per the patient she did not feel a skin graft or operative debridement was indicated DYWANE, PERUSKI. ([id number]) The patient had previous venous ablation in February 2018. Last arterial studies were in December 2017 and were within normal limits Objective Constitutional Patient is hypertensive.. Pulse regular and within target range for patient.Marland Kitchen Respirations regular, non-labored and within target range.. Temperature is normal and within the target range for the patient.Marland Kitchen appears in no distress. Vitals Time Taken: 8:05 AM, Height: 76 in, Weight: 238 lbs, BMI: 29, Temperature: 98.3  F, Pulse: 79 bpm, Respiratory Rate: 18 breaths/min, Blood Pressure: 150/71 mmHg. Cardiovascular Pedal pulses palpable and strong bilaterally.. 3 is no edema. General Notes: Wound exam; no major change in the condition of the large wound on the right lateral lower leg and ankle. The area on the right medial leg looks larger although the surface looks better. There is some exposed healthy tissue here which I suppose his reason for very guarded optimism Integumentary (Hair, Skin) Wound #1 status is Open. Original cause of wound was Gradually Appeared. The wound is located on the Right,Lateral Malleolus. The wound measures 5cm length x 3.6cm width x 0.2cm depth; 14.137cm^2 area and 2.827cm^3 volume. There is Fat Layer (Subcutaneous Tissue) Exposed exposed. There is no tunneling or undermining noted. There is a large amount of serous drainage noted. The wound margin is distinct with the outline attached to the wound base. There is small (1-33%) pink granulation within the wound bed. There is a large (67-100%) amount of necrotic tissue within the wound bed including Adherent Slough. The periwound skin appearance exhibited: Scarring, Maceration, Hemosiderin Staining. The periwound skin appearance did not exhibit: Callus, Crepitus, Excoriation, Induration, Rash, Dry/Scaly, Atrophie Blanche, Cyanosis, Ecchymosis, Mottled, Pallor, Rubor, Erythema. Periwound temperature was noted as No Abnormality. The periwound has tenderness on palpation.  Wound #2 status is Open. Original cause of wound was Gradually Appeared. The wound is located on the Right,Medial Malleolus. The wound measures 3cm length x 3cm width x 0.1cm depth; 7.069cm^2 area and 0.707cm^3 volume. There is Fat Layer (Subcutaneous Tissue) Exposed exposed. There is no tunneling or undermining noted. There is a large amount of serous drainage noted. The wound margin is distinct with the outline attached to the wound base. There is small (1-33%)  pink granulation within the wound bed. There is a large (67-100%) amount of necrotic tissue within the wound bed including Adherent Slough. The periwound skin appearance exhibited: Scarring, Maceration, Hemosiderin Staining. The periwound skin appearance did not exhibit: Callus, Crepitus, Excoriation, Induration, Rash, Dry/Scaly, Markevious, Ehmke, Bassel C. ([id number]) Cyanosis, Ecchymosis, Mottled, Pallor, Rubor, Erythema. Periwound temperature was noted as No Abnormality. The periwound has tenderness on palpation. 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 Cleanse wound with mild soap and water May Shower, gently pat wound dry prior to applying new dressing. Wound #2 Right,Medial Malleolus: Clean wound with Normal Saline. Cleanse wound with mild soap and water May Shower, gently pat wound dry prior to applying new dressing. Anesthetic: Wound #1 Right,Lateral Malleolus: Topical Lidocaine 4% cream applied to wound bed prior to debridement Wound #2 Right,Medial Malleolus: Topical Lidocaine 4% cream applied to wound bed prior to debridement Skin Barriers/Peri-Wound Care: Wound #1 Right,Lateral Malleolus: Barrier cream Wound #2 Right,Medial Malleolus: Barrier cream Primary Wound Dressing: Wound #1 Right,Lateral Malleolus: Cutimed Sorbact Other: -  Anicept Gel Wound #2 Right,Medial Malleolus: MANDY, FITZWATER ([id number]) Cutimed Sorbact Other: - Anicept Gel Secondary Dressing: Wound #1 Right,Lateral Malleolus: ABD pad Conform/Kerlix Drawtex Wound #2 Right,Medial Malleolus: ABD pad Conform/Kerlix Drawtex Dressing Change Frequency: Wound #1 Right,Lateral Malleolus: Change dressing every day. Wound #2 Right,Medial Malleolus: Change dressing every day. Follow-up Appointments: Wound #1 Right,Lateral Malleolus: Return Appointment in 2 weeks. Wound #2 Right,Medial Malleolus: Return Appointment in 2 weeks. Edema Control: Wound #1 Right,Lateral Malleolus: Elevate legs to the level of the heart and pump ankles as often as possible Wound #2 Right,Medial Malleolus: Elevate legs to the level of the heart and pump ankles as often as possible Additional Orders / Instructions: Wound #1 Right,Lateral Malleolus: Increase protein intake. Wound #2 Right,Medial Malleolus: Increase protein intake. Medications-please add to medication list.: Wound #1 Right,Lateral Malleolus: Other: - Vitamin C, Zinc, MVI Wound #2 Right,Medial Malleolus: Other: - Vitamin C, Zinc, MVI #1 per the patient's description Dr. Ulice Bold did not recommend continued frequent debridements. Nor did she give any possibility to operative debridement or A CELL placement. I have not yet reviewed her note but will do so DEMONTE, DOBRATZ. ([id number]) # 2I did review the patient's vascular status today is arterial studies were normal in December 2017 he had venous reflux surgery in February 2018 #3 we will give the Anasept and the sorbact some time to see if we can reduce the amount of adherent necrotic slough and drainage. #4 follow-up in 2 weeks Electronic Signature(s) Signed: 06/04/2017 4:33:30  PM By: [name] MD Entered By: [name] on 06/04/2017 08:36:52 [name]  ([id number]) -------------------------------------------------------------------------------- Emeline Gins Date of Service: 06/04/2017 Patient Name: C. Patient Account Number: [id number] Medical Record Treating RN: [name] [id number] Number: Other Clinician: Date of Birth/Sex: 04-13-1946 (71 y.o. 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, initial T85.613A encounter Disruption of external operation (surgical) wound, not elsewhere classified, subsequent T81.31XD encounter L97.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 of skin Facility Procedures CPT4 Code: [id number] Description: 99213 - WOUND CARE VISIT-LEV 3 EST PT Modifier: Quantity: 1 Physician Procedures CPT4 Code: [id number] Description: 74259 - WC PHYS LEVEL 2 - EST PT ICD-10 Description Diagnosis L97.212 Non-pressure chronic ulcer of right calf with fat l Modifier: ayer exposed Quantity: 1 Electronic Signature(s) Signed: 06/04/2017 4:19:28 PM By: Alejandro Mulling Signed: 06/04/2017 4:33:30 PM By: [name] MD Entered By: Alejandro Mulling on 06/04/2017 09:27:50

## 2017-06-05 NOTE — Progress Notes (Signed)
AYYUB, KRALL ([id number]) Visit Report for 06/04/2017 Arrival Information Details Patient Name: [name], [name]. Date of Service: 06/04/2017 8:00 AM Medical Record Number: [id number] Patient Account Number: [id number] Date of Birth/Sex: 1945-09-24 (71 y.o. Male) Treating RN: [name] Primary Care Leocadia Idleman: [name] Other Clinician: Referring Thi Sisemore: [name] Treating Rilen Shukla/Extender: Altamese [place] in Treatment: 72 Visit Information History Since Last Visit All ordered tests and consults were completed: No Patient Arrived: Ambulatory Added or deleted any medications: No Arrival Time: 08:03 Any new allergies or adverse reactions: No Accompanied By: self Had a fall or experienced change in No Transfer Assistance: None activities of daily living that may affect Patient Identification Verified: Yes risk of falls: Secondary Verification Process Yes Signs or symptoms of abuse/neglect since last No Completed: visito Patient Requires Transmission- No Hospitalized since last visit: No Based Precautions: Has Dressing in Place as Prescribed: Yes Patient Has Alerts: Yes Pain Present Now: No Patient Alerts: Patient on Blood Thinner Electronic Signature(s) Signed: 06/04/2017 4:19:28 PM By: Alejandro Mulling Entered By: Alejandro Mulling on 06/04/2017 08:03:19 [name] ([id number]) -------------------------------------------------------------------------------- Clinic Level of Care Assessment Details Patient Name: [name] Date of Service: 06/04/2017 8:00 AM Medical Record Number: [id number] Patient Account Number: [id number] Date of Birth/Sex: 1946-05-14 (71 y.o. Male) Treating RN: Ashok Cordia, [name] Primary Care Mackenna Kamer: [name] Other Clinician: Referring Chetan Mehring: [name] Treating Donavan Kerlin/Extender: Altamese [place] in Treatment: 72 Clinic Level of Care Assessment Items TOOL 4 Quantity Score X - Use when  only an EandM is performed on FOLLOW-UP visit 1 0 ASSESSMENTS - Nursing Assessment / Reassessment X - Reassessment of Co-morbidities (includes updates in patient status) 1 10 X - Reassessment of Adherence to Treatment Plan 1 5 ASSESSMENTS - Wound and Skin Assessment / Reassessment  - Simple Wound Assessment / Reassessment - one wound 0 X - Complex Wound Assessment / Reassessment - multiple wounds 2 5  - Dermatologic / Skin Assessment (not related to wound area) 0 ASSESSMENTS - Focused Assessment  - Circumferential Edema Measurements - multi extremities 0  - Nutritional Assessment / Counseling / Intervention 0  - Lower Extremity Assessment (monofilament, tuning fork, pulses) 0  - Peripheral Arterial Disease Assessment (using hand held doppler) 0 ASSESSMENTS - Ostomy and/or Continence Assessment and Care  - Incontinence Assessment and Management 0  - Ostomy Care Assessment and Management (repouching, etc.) 0 PROCESS - Coordination of Care X - Simple Patient / Family Education for ongoing care 1 15  - Complex (extensive) Patient / Family Education for ongoing care 0  - Staff obtains Chiropractor, Records, Test Results / Process Orders 0  - Staff telephones HHA, Nursing Homes / Clarify orders / etc 0  - Routine Transfer to another Facility (non-emergent condition) 0 Lagares, Arlester C. ([id number])  - Routine Hospital Admission (non-emergent condition) 0  - New Admissions / Manufacturing engineer / Ordering NPWT, Apligraf, etc. 0  - Emergency Hospital Admission (emergent condition) 0 X - Simple Discharge Coordination 1 10  - Complex (extensive) Discharge Coordination 0 PROCESS - Special Needs  - Pediatric / Minor Patient Management 0  - Isolation Patient Management 0  - Hearing / Language / Visual special needs 0  - Assessment of Community assistance (transportation, D/C planning, etc.) 0  - Additional assistance / Altered mentation 0  - Support  Surface(s) Assessment (bed, cushion, seat, etc.) 0 INTERVENTIONS - Wound Cleansing / Measurement  - Simple Wound Cleansing - one wound 0 X - Complex Wound Cleansing - multiple  wounds 2 5 X - Wound Imaging (photographs - any number of wounds) 1 5  - Wound Tracing (instead of photographs) 0  - Simple Wound Measurement - one wound 0 X - Complex Wound Measurement - multiple wounds 2 5 INTERVENTIONS - Wound Dressings  - Small Wound Dressing one or multiple wounds 0 X - Medium Wound Dressing one or multiple wounds 1 15  - Large Wound Dressing one or multiple wounds 0 X - Application of Medications - topical 1 5  - Application of Medications - injection 0 INTERVENTIONS - Miscellaneous  - External ear exam 0 Blacklock, Haneef C. ([id number])  - Specimen Collection (cultures, biopsies, blood, body fluids, etc.) 0  - Specimen(s) / Culture(s) sent or taken to Lab for analysis 0  - Patient Transfer (multiple staff / Michiel Sites Lift / Similar devices) 0  - Simple Staple / Suture removal (25 or less) 0  - Complex Staple / Suture removal (26 or more) 0  - Hypo / Hyperglycemic Management (close monitor of Blood Glucose) 0  - Ankle / Brachial Index (ABI) - do not check if billed separately 0 X - Vital Signs 1 5 Has the patient been seen at the hospital within the last three years: Yes Total Score: 100 Level Of Care: New/Established - Level 3 Electronic Signature(s) Signed: 06/04/2017 4:19:28 PM By: Alejandro Mulling Entered By: Alejandro Mulling on 06/04/2017 09:27:40 [name] ([id number]) -------------------------------------------------------------------------------- Encounter Discharge Information Details Patient Name: [name] Date of Service: 06/04/2017 8:00 AM Medical Record Number: [id number] Patient Account Number: [id number] Date of Birth/Sex: 01-Jan-1946 (71 y.o. Male) Treating RN: [name] Primary Care Tevon Berhane: [name] Other  Clinician: Referring Bryceton Hantz: [name] Treating Olly Shiner/Extender: [name] Weeks in Treatment: 72 Encounter Discharge Information Items Discharge Pain Level: 0 Discharge Condition: Stable Ambulatory Status: Ambulatory Discharge Destination: Home Transportation: Private Auto Accompanied By: self Schedule Follow-up Appointment: Yes Medication Reconciliation completed No and provided to Patient/Care Shantale Holtmeyer: Patient Clinical Summary of Care: Declined Electronic Signature(s) Signed: 06/04/2017 4:19:28 PM By: Alejandro Mulling Entered By: Alejandro Mulling on 06/04/2017 08:35:06 [name] ([id number]) -------------------------------------------------------------------------------- Lower Extremity Assessment Details Patient Name: [name] Date of Service: 06/04/2017 8:00 AM Medical Record Number: [id number] Patient Account Number: [id number] Date of Birth/Sex: October 10, 1945 (71 y.o. Male) Treating RN: [name] Primary Care Raahi Korber: [name] Other Clinician: Referring Louie Meaders: [name] Treating Garry Bochicchio/Extender: [name] Weeks in Treatment: 72 Vascular Assessment Pulses: Dorsalis Pedis Palpable: [Right:Yes] Posterior Tibial Extremity colors, hair growth, and conditions: Extremity Color: [Right:Normal] Temperature of Extremity: [Right:Warm] Capillary Refill: [Right:< 3 seconds] Toe Nail Assessment Left: Right: Thick: Yes Discolored: Yes Deformed: Yes Improper Length and Hygiene: Yes Electronic Signature(s) Signed: 06/04/2017 4:19:28 PM By: Alejandro Mulling Entered By: Alejandro Mulling on 06/04/2017 08:14:59 [name] ([id number]) -------------------------------------------------------------------------------- Multi Wound Chart Details Patient Name: [name] Date of Service: 06/04/2017 8:00 AM Medical Record Number: [id number] Patient Account Number: [id number] Date of Birth/Sex: 03/02/1946 (71  y.o. Male) Treating RN: Ashok Cordia, [name] Primary Care Maudry Zeidan: [name] Other Clinician: Referring Tiwatope Emmitt: [name] Treating Nona Gracey/Extender: [name] Weeks in Treatment: 72 Vital Signs Height(in): 76 Pulse(bpm): 79 Weight(lbs): 238 Blood Pressure 150/71 (mmHg): Body Mass Index(BMI): 29 Temperature(F): 98.3 Respiratory Rate 18 (breaths/min): Photos: [1:No Photos] [2:No Photos] [N/A:N/A] Wound Location: [1:Right Malleolus - Lateral] [2:Right Malleolus - Medial] [N/A:N/A] Wounding Event: [1:Gradually Appeared] [2:Gradually Appeared] [N/A:N/A] Primary Etiology: [1:Venous Leg Ulcer] [2:Venous Leg Ulcer] [N/A:N/A] Comorbid History: [1:Cataracts, Chronic Obstructive Pulmonary Disease (COPD), Osteoarthritis] [2:Cataracts,  Chronic Obstructive Pulmonary Disease (COPD), Osteoarthritis] [N/A:N/A] Date Acquired: [1:08/11/2015] [2:01/30/2016] [N/A:N/A] Weeks of Treatment: [1:72] [2:70] [N/A:N/A] Wound Status: [1:Open] [2:Open] [N/A:N/A] Clustered Wound: [1:No] [2:Yes] [N/A:N/A] Measurements L x W x D 5x3.6x0.2 [2:3x3x0.1] [N/A:N/A] (cm) Area (cm) : [1:14.137] [2:7.069] [N/A:N/A] Volume (cm) : [1:2.827] [2:0.707] [N/A:N/A] % Reduction in Area: [1:18.20%] [2:38.80%] [N/A:N/A] % Reduction in Volume: 45.50% [2:38.80%] [N/A:N/A] Classification: [1:Full Thickness Without Exposed Support Structures] [2:Full Thickness Without Exposed Support Structures] [N/A:N/A] Exudate Amount: [1:Large] [2:Large] [N/A:N/A] Exudate Type: [1:Serous] [2:Serous] [N/A:N/A] Exudate Color: [1:amber] [2:amber] [N/A:N/A] Wound Margin: [1:Distinct, outline attached] [2:Distinct, outline attached] [N/A:N/A] Granulation Amount: [1:Small (1-33%)] [2:Small (1-33%)] [N/A:N/A] Granulation Quality: [1:Pink] [2:Pink] [N/A:N/A] Necrotic Amount: [1:Large (67-100%)] [2:Large (67-100%)] [N/A:N/A] Exposed Structures: [1:Fat Layer (Subcutaneous Tissue) Exposed: Yes] [2:Fat Layer (Subcutaneous Tissue)  Exposed: Yes] [N/A:N/A] Fascia: No Fascia: No Tendon: No Tendon: No Muscle: No Muscle: No Joint: No Joint: No Bone: No Bone: No Epithelialization: Small (1-33%) Small (1-33%) N/A Periwound Skin Texture: Scarring: Yes Scarring: Yes N/A Excoriation: No Excoriation: No Induration: No Induration: No Callus: No Callus: No Crepitus: No Crepitus: No Rash: No Rash: No Periwound Skin Maceration: Yes Maceration: Yes N/A Moisture: Dry/Scaly: No Dry/Scaly: No Periwound Skin Color: Hemosiderin Staining: Yes Hemosiderin Staining: Yes N/A Atrophie Blanche: No Atrophie Blanche: No Cyanosis: No Cyanosis: No Ecchymosis: No Ecchymosis: No Erythema: No Erythema: No Mottled: No Mottled: No Pallor: No Pallor: No Rubor: No Rubor: No Temperature: No Abnormality No Abnormality N/A Tenderness on Yes Yes N/A Palpation: Wound Preparation: Ulcer Cleansing: Ulcer Cleansing: N/A Rinsed/Irrigated with Rinsed/Irrigated with Saline Saline Topical Anesthetic Topical Anesthetic Applied: Other: lidocaine Applied: Other: lidocaine 4% 4% Treatment Notes Electronic Signature(s) Signed: 06/04/2017 4:33:30 PM By: [name] MD Entered By: [name] on 06/04/2017 08:27:50 [name] ([id number]) -------------------------------------------------------------------------------- Multi-Disciplinary Care Plan Details Patient Name: [name] Date of Service: 06/04/2017 8:00 AM Medical Record Number: [id number] Patient Account Number: [id number] Date of Birth/Sex: [birth year] (71 y.o. Male) Treating RN: [name] Primary Care Ahmarion Saraceno: [name] Other Clinician: Referring Wahid Holley: [name] Treating Baleria Wyman/Extender: [name] Weeks in Treatment: 72 Active Inactive ` Venous Leg Ulcer Nursing Diagnoses: Knowledge deficit related to disease process and management Goals: Patient will maintain optimal edema control Date Initiated:  04/03/2016 Target Resolution Date: 11/24/2016 Goal Status: Active Interventions: Compression as ordered Treatment Activities: Therapeutic compression applied : 04/03/2016 Notes: ` Wound/Skin Impairment Nursing Diagnoses: Impaired tissue integrity Goals: Ulcer/skin breakdown will heal within 14 weeks Date Initiated: 01/17/2016 Target Resolution Date: 11/24/2016 Goal Status: Active Interventions: Assess ulceration(s) every visit Notes: Electronic Signature(s) Signed: 06/04/2017 4:19:28 PM By: Aurelio Jew, [name] Severance ([id number]) Entered By: Alejandro Mulling on 06/04/2017 08:15:26 [name] ([id number]) -------------------------------------------------------------------------------- Pain Assessment Details Patient Name: [name] Date of Service: 06/04/2017 8:00 AM Medical Record Number: [id number] Patient Account Number: [id number] Date of Birth/Sex: March 22, 1946 (71 y.o. Male) Treating RN: [name] Primary Care Analeigh Aries: [name] Other Clinician: Referring Lyn Joens: [name] Treating Arthelia Callicott/Extender: [name] Weeks in Treatment: 72 Active Problems Location of Pain Severity and Description of Pain Patient Has Paino No Site Locations Pain Management and Medication Current Pain Management: Electronic Signature(s) Signed: 06/04/2017 4:19:28 PM By: Alejandro Mulling Entered By: Alejandro Mulling on 06/04/2017 08:03:25 [name] ([id number]) -------------------------------------------------------------------------------- Patient/Caregiver Education Details [name] Date of Service: 06/04/2017 8:00 AM Patient Name: C. Patient Account Number: [id number] Medical Record Treating RN: [name] [id number] Number: Other Clinician: Date of Birth/Gender: 05/11/1946 (72 y.o. Male) Treating [name] Primary Care Physician: MILES,  LINDA Physician/Extender: G Referring Physician: [name] in  Treatment: 90 Education Assessment Education Provided To: Patient Education Topics Provided Wound/Skin Impairment: Handouts: Other: change dressing as ordered Methods: Demonstration, Explain/Verbal Responses: State content correctly Electronic Signature(s) Signed: 06/04/2017 4:19:28 PM By: Alejandro Mulling Entered By: Alejandro Mulling on 06/04/2017 08:35:19 [name] ([id number]) -------------------------------------------------------------------------------- Wound Assessment Details Patient Name: [name] Date of Service: 06/04/2017 8:00 AM Medical Record Number: [id number] Patient Account Number: [id number] Date of Birth/Sex: 1945/09/14 (71 y.o. Male) Treating RN: Ashok Cordia, [name] Primary Care Lateisha Thurlow: [name] Other Clinician: Referring Malvina Schadler: [name] Treating Ruchama Kubicek/Extender: [name] Weeks in Treatment: 72 Wound Status Wound Number: 1 Primary Venous Leg Ulcer Etiology: Wound Location: Right Malleolus - Lateral Wound Open Wounding Event: Gradually Appeared Status: Date Acquired: 08/11/2015 Comorbid Cataracts, Chronic Obstructive Weeks Of Treatment: 72 History: Pulmonary Disease (COPD), Clustered Wound: No Osteoarthritis Photos Photo Uploaded By: Alejandro Mulling on 06/04/2017 09:54:44 Wound Measurements Length: (cm) 5 Width: (cm) 3.6 Depth: (cm) 0.2 Area: (cm) 14.137 Volume: (cm) 2.827 % Reduction in Area: 18.2% % Reduction in Volume: 45.5% Epithelialization: Small (1-33%) Tunneling: No Undermining: No Wound Description Full Thickness Without Exposed Foul Odor After Classification: Support Structures Slough/Fibrino Wound Margin: Distinct, outline attached Exudate Large Amount: Exudate Type: Serous Exudate Color: amber Cleansing: No Yes Wound Bed Granulation Amount: Small (1-33%) Exposed Structure Granulation Quality: Pink Fascia Exposed: No Anctil, Elyas C. ([id number]) Necrotic Amount: Large  (67-100%) Fat Layer (Subcutaneous Tissue) Exposed: Yes Necrotic Quality: Adherent Slough Tendon Exposed: No Muscle Exposed: No Joint Exposed: No Bone Exposed: No Periwound Skin Texture Texture Color No Abnormalities Noted: No No Abnormalities Noted: No Callus: No Atrophie Blanche: No Crepitus: No Cyanosis: No Excoriation: No Ecchymosis: No Induration: No Erythema: No Rash: No Hemosiderin Staining: Yes Scarring: Yes Mottled: No Pallor: No Moisture Rubor: No No Abnormalities Noted: No Dry / Scaly: No Temperature / Pain Maceration: Yes Temperature: No Abnormality Tenderness on Palpation: Yes Wound Preparation Ulcer Cleansing: Rinsed/Irrigated with Saline Topical Anesthetic Applied: Other: lidocaine 4%, Treatment Notes Wound #1 (Right, Lateral Malleolus) 1. Cleansed with: Clean wound with Normal Saline 2. Anesthetic Topical Lidocaine 4% cream to wound bed prior to debridement 4. Dressing Applied: Other dressing (specify in notes) 5. Secondary Dressing Applied ABD Pad Dry Gauze Kerlix/Conform 7. Secured with Tape Notes sorbact, anacept gel, drawtex Electronic Signature(s) Signed: 06/04/2017 4:19:28 PM By: Alejandro Mulling Entered By: Alejandro Mulling on 06/04/2017 08:13:23 [name], [name] Severance ([id number]) -------------------------------------------------------------------------------- Wound Assessment Details Patient Name: [name] Date of Service: 06/04/2017 8:00 AM Medical Record Number: [id number] Patient Account Number: [id number] Date of Birth/Sex: 1946-06-15 (71 y.o. Male) Treating RN: Ashok Cordia, [name] Primary Care Shaylon Aden: [name] Other Clinician: Referring Eustace Hur: [name] Treating Ariana Juul/Extender: [name] Weeks in Treatment: 72 Wound Status Wound Number: 2 Primary Venous Leg Ulcer Etiology: Wound Location: Right Malleolus - Medial Wound Open Wounding Event: Gradually  Appeared Status: Date Acquired: 01/30/2016 Comorbid Cataracts, Chronic Obstructive Weeks Of Treatment: 70 History: Pulmonary Disease (COPD), Clustered Wound: Yes Osteoarthritis Photos Photo Uploaded By: Alejandro Mulling on 06/04/2017 09:54:44 Wound Measurements Length: (cm) 3 Width: (cm) 3 Depth: (cm) 0.1 Area: (cm) 7.069 Volume: (cm) 0.707 % Reduction in Area: 38.8% % Reduction in Volume: 38.8% Epithelialization: Small (1-33%) Tunneling: No Undermining: No Wound Description Full Thickness Without Exposed Foul Odor After Classification: Support Structures Slough/Fibrino Wound Margin: Distinct, outline attached Exudate Large Amount: Exudate Type: Serous Exudate Color: amber Cleansing: No Yes Wound Bed Granulation Amount: Small (1-33%)  Exposed Structure Granulation Quality: Pink Fascia Exposed: No Scaletta, Lenon C. ([id number]) Necrotic Amount: Large (67-100%) Fat Layer (Subcutaneous Tissue) Exposed: Yes Necrotic Quality: Adherent Slough Tendon Exposed: No Muscle Exposed: No Joint Exposed: No Bone Exposed: No Periwound Skin Texture Texture Color No Abnormalities Noted: No No Abnormalities Noted: No Callus: No Atrophie Blanche: No Crepitus: No Cyanosis: No Excoriation: No Ecchymosis: No Induration: No Erythema: No Rash: No Hemosiderin Staining: Yes Scarring: Yes Mottled: No Pallor: No Moisture Rubor: No No Abnormalities Noted: No Dry / Scaly: No Temperature / Pain Maceration: Yes Temperature: No Abnormality Tenderness on Palpation: Yes Wound Preparation Ulcer Cleansing: Rinsed/Irrigated with Saline Topical Anesthetic Applied: Other: lidocaine 4%, Treatment Notes Wound #2 (Right, Medial Malleolus) 1. Cleansed with: Clean wound with Normal Saline 2. Anesthetic Topical Lidocaine 4% cream to wound bed prior to debridement 4. Dressing Applied: Other dressing (specify in notes) 5. Secondary Dressing Applied ABD Pad Dry  Gauze Kerlix/Conform 7. Secured with Tape Notes sorbact, anacept gel, drawtex Electronic Signature(s) Signed: 06/04/2017 4:19:28 PM By: Alejandro Mulling Entered By: Alejandro Mulling on 06/04/2017 08:12:08 [name] ([id number]) [name], [name] Severance ([id number]) -------------------------------------------------------------------------------- Vitals Details Patient Name: [name] Date of Service: 06/04/2017 8:00 AM Medical Record Number: [id number] Patient Account Number: [id number] Date of Birth/Sex: 03-20-1946 (71 y.o. Male) Treating RN: Ashok Cordia, [name] Primary Care Sajjad Honea: [name] Other Clinician: Referring Preslynn Bier: [name] Treating Afsheen Antony/Extender: [name] Weeks in Treatment: 72 Vital Signs Time Taken: 08:05 Temperature (F): 98.3 Height (in): 76 Pulse (bpm): 79 Weight (lbs): 238 Respiratory Rate (breaths/min): 18 Body Mass Index (BMI): 29 Blood Pressure (mmHg): 150/71 Reference Range: 80 - 120 mg / dl Electronic Signature(s) Signed: 06/04/2017 4:19:28 PM By: Alejandro Mulling Entered By: Alejandro Mulling on 06/04/2017 08:05:46

## 2017-06-18 ENCOUNTER — Encounter: Payer: Medicare HMO | Admitting: Internal Medicine

## 2017-06-18 DIAGNOSIS — I87331 Chronic venous hypertension (idiopathic) with ulcer and inflammation of right lower extremity: Secondary | ICD-10-CM | POA: Diagnosis not present

## 2017-06-20 NOTE — Progress Notes (Signed)
[name], [name] ([id number]) Visit Report for 06/18/2017 Arrival Information Details Patient Name: [name], [name]. Date of Service: 06/18/2017 8:00 AM Medical Record Number: [id number] Patient Account Number: [id number] Date of Birth/Sex: 11-Sep-1945 (71 y.o. Male) Treating RN: [name] Primary Care Roquel Burgin: [name] Other Clinician: Referring Madolyn Ackroyd: [name] Treating Vicie Cech/Extender: [place] in Treatment: 29 Visit Information History Since Last Visit All ordered tests and consults were completed: No Patient Arrived: Ambulatory Added or deleted any medications: No Arrival Time: 08:06 Any new allergies or adverse reactions: No Accompanied By: self Had a fall or experienced change in No Transfer Assistance: None activities of daily living that may affect Patient Identification Verified: Yes risk of falls: Secondary Verification Process Yes Signs or symptoms of abuse/neglect since last visito No Completed: Hospitalized since last visit: No Patient Requires Transmission-Based No Has Dressing in Place as Prescribed: Yes Precautions: Pain Present Now: No Patient Has Alerts: Yes Patient Alerts: Patient on Blood Thinner Electronic Signature(s) Signed: 06/18/2017 4:58:47 PM By: Alejandro Mulling Entered By: Alejandro Mulling on 06/18/2017 08:07:23 [name] ([id number]) -------------------------------------------------------------------------------- Encounter Discharge Information Details Patient Name: [name] Date of Service: 06/18/2017 8:00 AM Medical Record Number: [id number] Patient Account Number: [id number] Date of Birth/Sex: 10-20-1945 (71 y.o. Male) Treating RN: [name] Primary Care Aaryav Hopfensperger: [name] Other Clinician: Referring Nataya Bastedo: [name] Treating Nylia Gavina/Extender: [name] Weeks in Treatment: 71 Encounter Discharge Information Items Discharge Pain Level: 0 Discharge  Condition: Stable Ambulatory Status: Ambulatory Discharge Destination: Home Transportation: Private Auto Accompanied By: self Schedule Follow-up Appointment: Yes Medication Reconciliation completed and No provided to Patient/Care Yanel Dombrosky: Patient Clinical Summary of Care: Declined Electronic Signature(s) Signed: 06/18/2017 9:00:40 AM By: Alejandro Mulling Entered By: Alejandro Mulling on 06/18/2017 09:00:40 [name] ([id number]) -------------------------------------------------------------------------------- Lower Extremity Assessment Details Patient Name: [name] Date of Service: 06/18/2017 8:00 AM Medical Record Number: [id number] Patient Account Number: [id number] Date of Birth/Sex: 30-Nov-1945 (71 y.o. Male) Treating RN: [name] Primary Care Yee Gangi: [name] Other Clinician: Referring Cristian Grieves: [name] Treating Illa Enlow/Extender: [name] Weeks in Treatment: 74 Vascular Assessment Pulses: Dorsalis Pedis Palpable: [Right:Yes] Posterior Tibial Extremity colors, hair growth, and conditions: Extremity Color: [Right:Normal] Temperature of Extremity: [Right:Warm] Capillary Refill: [Right:< 3 seconds] Toe Nail Assessment Left: Right: Thick: Yes Discolored: Yes Deformed: No Improper Length and Hygiene: Yes Electronic Signature(s) Signed: 06/18/2017 4:58:47 PM By: Alejandro Mulling Entered By: Alejandro Mulling on 06/18/2017 08:16:23 [name] ([id number]) -------------------------------------------------------------------------------- Multi Wound Chart Details Patient Name: [name] Date of Service: 06/18/2017 8:00 AM Medical Record Number: [id number] Patient Account Number: [id number] Date of Birth/Sex: 1946-05-17 (71 y.o. Male) Treating RN: [name] Primary Care Loda Bialas: [name] Other Clinician: Referring Sallie Maker: [name] Treating Eshaal Duby/Extender: [name] Weeks in  Treatment: 74 Vital Signs Height(in): 76 Pulse(bpm): 65 Weight(lbs): 238 Blood Pressure(mmHg): 134/63 Body Mass Index(BMI): 29 Temperature(F): 97.9 Respiratory Rate 18 (breaths/min): Photos: [1:No Photos] [2:No Photos] [N/A:N/A] Wound Location: [1:Right Malleolus - Lateral] [2:Right Malleolus - Medial] [N/A:N/A] Wounding Event: [1:Gradually Appeared] [2:Gradually Appeared] [N/A:N/A] Primary Etiology: [1:Venous Leg Ulcer] [2:Venous Leg Ulcer] [N/A:N/A] Comorbid History: [1:Cataracts, Chronic Obstructive Cataracts, Chronic Obstructive N/A Pulmonary Disease (COPD), Pulmonary Disease (COPD), Osteoarthritis] [2:Osteoarthritis] Date Acquired: [1:08/11/2015] [2:01/30/2016] [N/A:N/A] Weeks of Treatment: [1:74] [2:72] [N/A:N/A] Wound Status: [1:Open] [2:Open] [N/A:N/A] Clustered Wound: [1:No] [2:Yes] [N/A:N/A] Measurements L x W x D [1:4.5x3.5x0.2] [2:3.7x3.4x0.1] [N/A:N/A] (cm) Area (cm) : [1:12.37] [2:9.88] [N/A:N/A] Volume (cm) : [1:2.474] [2:0.988] [N/A:N/A] % Reduction in Area: [1:28.40%] [2:14.40%] [N/A:N/A] %  Reduction in Volume: [1:52.30%] [2:14.50%] [N/A:N/A] Classification: [1:Full Thickness Without Exposed Support Structures] [2:Full Thickness Without Exposed Support Structures] [N/A:N/A] Exudate Amount: [1:Large] [2:Large] [N/A:N/A] Exudate Type: [1:Serous] [2:Serous] [N/A:N/A] Exudate Color: [1:amber] [2:amber] [N/A:N/A] Wound Margin: [1:Distinct, outline attached] [2:Distinct, outline attached] [N/A:N/A] Granulation Amount: [1:Small (1-33%)] [2:None Present (0%)] [N/A:N/A] Granulation Quality: [1:Pink] [2:N/A] [N/A:N/A] Necrotic Amount: [1:Large (67-100%)] [2:Large (67-100%)] [N/A:N/A] Exposed Structures: [1:Fat Layer (Subcutaneous Tissue) Exposed: Yes Fascia: No Tendon: No Muscle: No Joint: No Bone: No] [2:Fat Layer (Subcutaneous Tissue) Exposed: Yes Fascia: No Tendon: No Muscle: No Joint: No Bone: No] [N/A:N/A] Epithelialization: [1:Small (1-33%)] [2:None]  [N/A:N/A] Debridement: [1:Debridement (11042-11047)] [2:Debridement (11042-11047)] [N/A:N/A] Pre-procedure [1:08:23] [2:08:23] [N/A:N/A] Verification/Time Out Taken: [name] StackBLACKWELL, Julia C. ([id number]) Pain Control: Lidocaine 4% Topical Solution Lidocaine 4% Topical Solution N/A Tissue Debrided: Fibrin/Slough, Exudates, Fibrin/Slough, Exudates, N/A Subcutaneous Subcutaneous Level: Skin/Subcutaneous Tissue Skin/Subcutaneous Tissue N/A Debridement Area (sq cm): 15.75 12.58 N/A Instrument: Other(scoop) Other(scoop) N/A Bleeding: Minimum Minimum N/A Hemostasis Achieved: Pressure Pressure N/A Procedural Pain: 0 0 N/A Post Procedural Pain: 0 0 N/A Debridement Treatment Procedure was tolerated well Procedure was tolerated well N/A Response: Post Debridement 4.5x3.5x0.3 3.7x3.4x0.2 N/A Measurements L x W x D (cm) Post Debridement Volume: 3.711 1.976 N/A (cm) Periwound Skin Texture: Scarring: Yes Scarring: Yes N/A Excoriation: No Excoriation: No Induration: No Induration: No Callus: No Callus: No Crepitus: No Crepitus: No Rash: No Rash: No Periwound Skin Moisture: Maceration: Yes Maceration: Yes N/A Dry/Scaly: No Dry/Scaly: No Periwound Skin Color: Hemosiderin Staining: Yes Hemosiderin Staining: Yes N/A Atrophie Blanche: No Atrophie Blanche: No Cyanosis: No Cyanosis: No Ecchymosis: No Ecchymosis: No Erythema: No Erythema: No Mottled: No Mottled: No Pallor: No Pallor: No Rubor: No Rubor: No Temperature: No Abnormality No Abnormality N/A Tenderness on Palpation: Yes Yes N/A Wound Preparation: Ulcer Cleansing: Ulcer Cleansing: N/A Rinsed/Irrigated with Saline Rinsed/Irrigated with Saline Topical Anesthetic Applied: Topical Anesthetic Applied: Other: lidocaine 4% Other: lidocaine 4% Procedures Performed: Debridement Debridement N/A Treatment Notes Electronic Signature(s) Signed: 06/18/2017 4:56:05 PM By: Baltazar Najjarobson, [name] MD Entered By: Baltazar Najjarobson, [name] on  06/18/2017 08:43:44 [name] StackBLACKWELL, Drayke C. ([id number]) -------------------------------------------------------------------------------- Multi-Disciplinary Care Plan Details Patient Name: [name] StackBLACKWELL, Aarya C. Date of Service: 06/18/2017 8:00 AM Medical Record Number: [id number] Patient Account Number: [id number] Date of Birth/Sex: [birth year](71 y.o. Male) Treating RN: Phillis HaggisPinkerton, [name] Primary Care Arriyanna Mersch: Darreld McleanMILES, LINDA Other Clinician: Referring Braeleigh Pyper: Darreld McleanMILES, LINDA Treating Kelso Bibby/Extender: Maxwell CaulOBSON, [name] G Weeks in Treatment: 74 Active Inactive ` Venous Leg Ulcer Nursing Diagnoses: Knowledge deficit related to disease process and management Goals: Patient will maintain optimal edema control Date Initiated: 04/03/2016 Target Resolution Date: 11/24/2016 Goal Status: Active Interventions: Compression as ordered Treatment Activities: Therapeutic compression applied : 04/03/2016 Notes: ` Wound/Skin Impairment Nursing Diagnoses: Impaired tissue integrity Goals: Ulcer/skin breakdown will heal within 14 weeks Date Initiated: 01/17/2016 Target Resolution Date: 11/24/2016 Goal Status: Active Interventions: Assess ulceration(s) every visit Notes: Electronic Signature(s) Signed: 06/18/2017 4:58:47 PM By: Alejandro MullingPinkerton, Debra Entered By: Alejandro MullingPinkerton, Debra on 06/18/2017 08:16:30 [name] StackBLACKWELL, Taite C. ([id number]) -------------------------------------------------------------------------------- Pain Assessment Details Patient Name: [name] StackBLACKWELL, Nisaiah C. Date of Service: 06/18/2017 8:00 AM Medical Record Number: [id number] Patient Account Number: [id number] Date of Birth/Sex: [birth year](71 y.o. Male) Treating RN: Phillis HaggisPinkerton, [name] Primary Care Mimie Goering: Darreld McleanMILES, LINDA Other Clinician: Referring Vassie Kugel: Darreld McleanMILES, LINDA Treating Tri Chittick/Extender: Maxwell CaulOBSON, [name] G Weeks in Treatment: 74 Active Problems Location of Pain Severity and Description of Pain Patient Has Paino No Site Locations Pain  Management and Medication Current Pain Management: Electronic Signature(s) Signed: 06/18/2017 4:58:47 PM By: Alejandro MullingPinkerton, Debra Entered By: Alejandro MullingPinkerton, Debra on  06/18/2017 08:07:29 JASUN, GASPARINI ([id number]) -------------------------------------------------------------------------------- Patient/Caregiver Education Details Patient Name: [name] Date of Service: 06/18/2017 8:00 AM Medical Record Number: [id number] Patient Account Number: [id number] Date of Birth/Gender: 1946/03/07 (71 y.o. Male) Treating RN: [name] Primary Care Physician: [name] Other Clinician: Referring Physician: [name] Treating Physician/Extender: [place] in Treatment: 34 Education Assessment Education Provided To: Patient Education Topics Provided Wound/Skin Impairment: Handouts: Other: change dressing as ordered Methods: Demonstration, Explain/Verbal Responses: State content correctly Electronic Signature(s) Signed: 06/18/2017 4:58:47 PM By: Alejandro Mulling Entered By: Alejandro Mulling on 06/18/2017 08:24:05 [name] ([id number]) -------------------------------------------------------------------------------- Wound Assessment Details Patient Name: [name] Date of Service: 06/18/2017 8:00 AM Medical Record Number: [id number] Patient Account Number: [id number] Date of Birth/Sex: Nov 27, 1945 (71 y.o. Male) Treating RN: Ashok Cordia, [name] Primary Care Lianny Molter: [name] Other Clinician: Referring Evonne Rinks: [name] Treating Gardner Servantes/Extender: [name] Weeks in Treatment: 74 Wound Status Wound Number: 1 Primary Venous Leg Ulcer Etiology: Wound Location: Right Malleolus - Lateral Wound Open Wounding Event: Gradually Appeared Status: Date Acquired: 08/11/2015 Comorbid Cataracts, Chronic Obstructive Pulmonary Weeks Of Treatment: 74 History: Disease (COPD), Osteoarthritis Clustered Wound: No Photos Photo Uploaded  By: Alejandro Mulling on 06/18/2017 10:50:39 Wound Measurements Length: (cm) 4.5 Width: (cm) 3.5 Depth: (cm) 0.2 Area: (cm) 12.37 Volume: (cm) 2.474 % Reduction in Area: 28.4% % Reduction in Volume: 52.3% Epithelialization: Small (1-33%) Tunneling: No Undermining: No Wound Description Full Thickness Without Exposed Support Classification: Structures Wound Margin: Distinct, outline attached Exudate Large Amount: Exudate Type: Serous Exudate Color: amber Foul Odor After Cleansing: No Slough/Fibrino Yes Wound Bed Granulation Amount: Small (1-33%) Exposed Structure Granulation Quality: Pink Fascia Exposed: No Necrotic Amount: Large (67-100%) Fat Layer (Subcutaneous Tissue) Exposed: Yes Necrotic Quality: Adherent Slough Tendon Exposed: No Muscle Exposed: No Joint Exposed: No Bone Exposed: No Kleinert, [place] C. ([id number]) Periwound Skin Texture Texture Color No Abnormalities Noted: No No Abnormalities Noted: No Callus: No Atrophie Blanche: No Crepitus: No Cyanosis: No Excoriation: No Ecchymosis: No Induration: No Erythema: No Rash: No Hemosiderin Staining: Yes Scarring: Yes Mottled: No Pallor: No Moisture Rubor: No No Abnormalities Noted: No Dry / Scaly: No Temperature / Pain Maceration: Yes Temperature: No Abnormality Tenderness on Palpation: Yes Wound Preparation Ulcer Cleansing: Rinsed/Irrigated with Saline Topical Anesthetic Applied: Other: lidocaine 4%, Treatment Notes Wound #1 (Right, Lateral Malleolus) 1. Cleansed with: Clean wound with Normal Saline 2. Anesthetic Topical Lidocaine 4% cream to wound bed prior to debridement 3. Peri-wound Care: Barrier cream 4. Dressing Applied: Hydrogel Other dressing (specify in notes) 5. Secondary Dressing Applied ABD Pad Dry Gauze Kerlix/Conform 7. Secured with Tape Notes drawtex, sorbact Electronic Signature(s) Signed: 06/18/2017 4:58:47 PM By: Alejandro Mulling Entered By: Alejandro Mulling on 06/18/2017 08:14:28 [name] ([id number]) -------------------------------------------------------------------------------- Wound Assessment Details Patient Name: [name] Date of Service: 06/18/2017 8:00 AM Medical Record Number: [id number] Patient Account Number: [id number] Date of Birth/Sex: 28-Feb-1946 (71 y.o. Male) Treating RN: Ashok Cordia, [name] Primary Care Ayeisha Lindenberger: [name] Other Clinician: Referring Kenden Brandt: [name] Treating Khary Schaben/Extender: [name] Weeks in Treatment: 74 Wound Status Wound Number: 2 Primary Venous Leg Ulcer Etiology: Wound Location: Right Malleolus - Medial Wound Open Wounding Event: Gradually Appeared Status: Date Acquired: 01/30/2016 Comorbid Cataracts, Chronic Obstructive Pulmonary Weeks Of Treatment: 72 History: Disease (COPD), Osteoarthritis Clustered Wound: Yes Photos Photo Uploaded By: Alejandro Mulling on 06/18/2017 10:50:40 Wound Measurements Length: (cm) 3.7 Width: (cm) 3.4 Depth: (cm) 0.1 Area: (cm) 9.88 Volume: (cm) 0.988 % Reduction in Area: 14.4% %  Reduction in Volume: 14.5% Epithelialization: None Tunneling: No Undermining: No Wound Description Full Thickness Without Exposed Support Classification: Structures Wound Margin: Distinct, outline attached Exudate Large Amount: Exudate Type: Serous Exudate Color: amber Foul Odor After Cleansing: No Slough/Fibrino Yes Wound Bed Granulation Amount: None Present (0%) Exposed Structure Necrotic Amount: Large (67-100%) Fascia Exposed: No Necrotic Quality: Adherent Slough Fat Layer (Subcutaneous Tissue) Exposed: Yes Tendon Exposed: No Muscle Exposed: No Joint Exposed: No Bone Exposed: No Sant, Dez C. ([id number]) Periwound Skin Texture Texture Color No Abnormalities Noted: No No Abnormalities Noted: No Callus: No Atrophie Blanche: No Crepitus: No Cyanosis: No Excoriation: No Ecchymosis: No Induration:  No Erythema: No Rash: No Hemosiderin Staining: Yes Scarring: Yes Mottled: No Pallor: No Moisture Rubor: No No Abnormalities Noted: No Dry / Scaly: No Temperature / Pain Maceration: Yes Temperature: No Abnormality Tenderness on Palpation: Yes Wound Preparation Ulcer Cleansing: Rinsed/Irrigated with Saline Topical Anesthetic Applied: Other: lidocaine 4%, Treatment Notes Wound #2 (Right, Medial Malleolus) 1. Cleansed with: Clean wound with Normal Saline 2. Anesthetic Topical Lidocaine 4% cream to wound bed prior to debridement 3. Peri-wound Care: Barrier cream 4. Dressing Applied: Hydrogel Other dressing (specify in notes) 5. Secondary Dressing Applied ABD Pad Dry Gauze Kerlix/Conform 7. Secured with Tape Notes drawtex, sorbact Electronic Signature(s) Signed: 06/18/2017 4:58:47 PM By: Alejandro Mulling Entered By: Alejandro Mulling on 06/18/2017 08:13:31 [name] ([id number]) -------------------------------------------------------------------------------- Vitals Details Patient Name: [name] Date of Service: 06/18/2017 8:00 AM Medical Record Number: [id number] Patient Account Number: [id number] Date of Birth/Sex: July 31, 1946 (71 y.o. Male) Treating RN: Ashok Cordia, [name] Primary Care Rylyn Ranganathan: [name] Other Clinician: Referring Kiev Labrosse: [name] Treating Keyri Salberg/Extender: [name] Weeks in Treatment: 74 Vital Signs Time Taken: 08:07 Temperature (F): 97.9 Height (in): 76 Pulse (bpm): 65 Weight (lbs): 238 Respiratory Rate (breaths/min): 18 Body Mass Index (BMI): 29 Blood Pressure (mmHg): 134/63 Reference Range: 80 - 120 mg / dl Electronic Signature(s) Signed: 06/18/2017 4:58:47 PM By: Alejandro Mulling Entered By: Alejandro Mulling on 06/18/2017 08:08:01

## 2017-06-20 NOTE — Progress Notes (Signed)
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 the wound. We have been using Iodoflex but with little improvement in the surface. Change to [facility] 02/12/17; still large amount of drainage. Much less adherent slough however. Started Hydrofera Blue last week 02/19/17; drainage is better this week. Much less adherent slough. Perhaps some improvement in dimensions. Using [facility] 02/26/17; severe venous insufficiency wounds on the right lateral and right medial leg. Drainage is some better and slough  is less adherent we've been using Hydrofera Blue 03/05/17 on evaluation today patient appears to be doing well. His wounds have been decreasing  in size and overall he is KRISTAPHER, DUBUQUE. ([id number]) pleased with how this is progressing. We are awaiting approval for the epigraph which has previously been recommended in the meantime the [facility] Dressing is to be doing very well for him. 03/12/17; wound dimensions are smaller still using Hydrofera Blue. Comes in on Fridays for a dressing change. 03/19/17; wound dimensions continued to contract. Healing of this wound is complicated by continuous significant drainage as well as recurrent buildup of necrotic surface material. We looked into Apligraf and he has a $290 co-pay per application but truthfully I think the drainage as well as the nonviable surface would preclude use of Apligraf there are any other skin substitute at this point therefore the continued plan will be debridement each clinic visit, 2 times a week dressing changes and continued use of Hydrofera Blue. improvement has been very slow but sustained 03/26/17 perhaps slight improvements in peripheral epithelialization is especially inferiorly. Still with large amount of drainage and tightly adherent necrotic surface on arrival. Along with the intake nurse I reviewed previous treatment. He worsened on Iodoflex and had a 4 week trial of sorbact, Polymen AG and a long courses of Aquacel Ag. He is not a candidate for advanced treatment options for many reasons 04/09/17 on evaluation today patient's right lower extremity wounds appear to be doing a little better. Fortunately he has no significant discomfort and has been tolerating the dressing changes including the wraps without complication. With that being said he really does not have swelling anymore compared to what he has had in the past following his vascular intervention. He wonders if potentially we could attempt avoiding the  rats to see if he could cleanse the wound in between to try to prevent some of the fiber and its buildup that occurs in the interim between when we see him week to week. No fevers, chills, nausea, or vomiting noted at this time. 04/16/17; noted that the staff made a choice last week not to put him in compression. The patient is changing his dressing at home using [facility] and changing every second day. His wife is washing the wound with saline. He is using kerlix. Surprisingly he has not developed a lot of edema. This choice was made because of the degree of fluid retention and maceration even with changing his dressing twice a week 04/23/17; absolutely no change. Using Hydrofera Blue. Recurrent tightly adherent nonviable surface material. This is been refractory to Iodoflex, sorbact and now Hydrofera Blue. More recently he has been changing his own dressings at home, cleansing the wound in the shower. He has not developed lower extremity edema 04/30/17; if anything the wound is larger still in adherent surface although it debrided easily today. I've been using Mehta honey for 1 week and I'd like to try and do it for a second week this see if we can get some form of viable surface here. 05/07/17; patient arrives with copious amounts of drainage and some pain in the superior part of the wound. He has not been systemically unwell. He's been changing this daily at home 05/14/17; patient arrives today complaining of less drainage and less pain. Dimensions slightly better. Surface culture I did of this last week grew MSSA I put him on dicloxacillin for 10 days. Patient requesting a further prescription since he feels so much better. He did not obtain the [facility] for reasons that aren't clear, he has been using Hydrofera Blue 05/21/17; perhaps some less drainage and less pain.  He is completing another week of doxycycline. Unfortunately there is no change in the wound measurements are appearance still  tightly adherent necrotic surface material that is really defied treatment. We have been using Hydrofera Blue most recently. He did not manage to get Anasept. He was told it was prescription at [facility] 05/29/17; absolutely no improvement in these wound areas. He had remote plastic surgery with who was done at [facility] in [place] surrounding this area. This was a traumatic wound with extensive plastic surgery/skin grafting at that time. I switched him to sorbact last week to see if he can do anything about the recurrent necrotic surface and drainage. This is largely been refractory to Iodoflex/Hydrofera Blue/alginates. I believe we tried Elby Showers and more recently Medi honey l however the drainage is really too excessive 06/04/17; patient went to see Dr. Ulice Bold. Per the patient she ordered him a compression stocking. Continued the Anasept gel and sorbact was already on. No major improvement in the condition of the wounds in fact the medial wound looks larger. Again per the patient she did not feel a skin graft or operative debridement was indicated The patient had previous venous ablation in February 2018. Last arterial studies were in December 2017 and were within normal limits 06/18/17; patient arrives back in clinic today using Anasept gel with sorbact. He changes this every day is wearing his own compression stocking. The patient actually saw Dr. Leta Baptist of plastic surgery. I've reviewed her note. The appointment was on 05/30/17. The basic issue with here was that she did not feel that skin grafts for patients with underlying stasis and edema have a good long-term success rate. She also discuss skin substitutes which has been done here as well however I have not been able to get the surface of these wounds to something that I think would support skin substitutes. This is why he felt he might need an operative debridement more aggressively than I can do in this clinic Review of  systems; is otherwise negative he feels well [place], Raymundo C. ([id number]) Objective Constitutional Sitting or standing Blood Pressure is within target range for patient.. Pulse regular and within target range for patient.Marland Kitchen Respirations regular, non-labored and within target range.. Temperature is normal and within the target range for the patient.Marland Kitchen appears in no distress. Vitals Time Taken: 8:07 AM, Height: 76 in, Weight: 238 lbs, BMI: 29, Temperature: 97.9 F, Pulse: 65 bpm, Respiratory Rate: 18 breaths/min, Blood Pressure: 134/63 mmHg. Cardiovascular Peripheral pulses are palpable on the right is edema is controlled. General Notes: Wound exam; no major change in the condition here. Perhaps slightly smaller on the lateral slightly larger on the medial. Debridement is easier in that the curet necrotic material comes off a lot easier than it used to although this is really cautious optimism. There is no evidence of surrounding infection no ischemia and no uncontrolled edema Integumentary (Hair, Skin) Wound #1 status is Open. Original cause of wound was Gradually Appeared. The wound is located on the Right,Lateral Malleolus. The wound measures 4.5cm length x 3.5cm width x 0.2cm depth; 12.37cm^2 area and 2.474cm^3 volume. There is Fat Layer (Subcutaneous Tissue) Exposed exposed. There is no tunneling or undermining noted. There is a large amount of serous drainage noted. The wound margin is distinct with the outline attached to the wound base. There is small (1-33%) pink granulation within the wound bed. There is a large (67-100%) amount of necrotic tissue within the wound bed including Adherent Slough. The periwound skin appearance exhibited:  Scarring, Maceration, Hemosiderin Staining. The periwound skin appearance did not exhibit: Callus, Crepitus, Excoriation, Induration, Rash, Dry/Scaly, Atrophie Blanche, Cyanosis, Ecchymosis, Mottled, Pallor, Rubor, Erythema. Periwound temperature was  noted as No Abnormality. The periwound has tenderness on palpation. Wound #2 status is Open. Original cause of wound was Gradually Appeared. The wound is located on the Right,Medial Malleolus. The wound measures 3.7cm length x 3.4cm width x 0.1cm depth; 9.88cm^2 area and 0.988cm^3 volume. There is Fat Layer (Subcutaneous Tissue) Exposed exposed. There is no tunneling or undermining noted. There is a large amount of serous drainage noted. The wound margin is distinct with the outline attached to the wound base. There is no granulation within the wound bed. There is a large (67-100%) amount of necrotic tissue within the wound bed including Adherent Slough. The periwound skin appearance exhibited: Scarring, Maceration, Hemosiderin Staining. The periwound skin appearance did not exhibit: Callus, Crepitus, Excoriation, Induration, Rash, Dry/Scaly, Atrophie Blanche, Cyanosis, Ecchymosis, Mottled, Pallor, Rubor, Erythema. Periwound temperature was noted as No Abnormality. The periwound has tenderness on palpation. 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 ([id number]) L97.211 - Non-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 to breakdown of skin Procedures Wound #1 Pre-procedure diagnosis of Wound #1 is a Venous Leg Ulcer located on the Right,Lateral Malleolus .Severity of Tissue Pre Debridement is: Fat layer exposed. There was a Skin/Subcutaneous Tissue Debridement ([id number]) debridement with total area of 15.75 sq cm  performed by [name], MD. with the following instrument(s): scoop to remove Viable and Non-Viable tissue/material including Exudate, Fibrin/Slough, and Subcutaneous after achieving pain control using Lidocaine 4% Topical Solution. A time out was conducted at 08:23, prior to the start of the procedure. A Minimum amount of bleeding was controlled with Pressure. The procedure was tolerated well with a pain level of 0 throughout and a pain level of 0 following the procedure. Post Debridement Measurements: 4.5cm length x 3.5cm width x 0.3cm depth; 3.711cm^3 volume. Character of Wound/Ulcer Post Debridement requires further debridement. Severity of Tissue Post Debridement is: Fat layer exposed. 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 There was a Skin/Subcutaneous Tissue Debridement ([id number]) debridement with total area of 12.58 sq cm performed by [name], MD. with the following instrument(s): scoop to remove Viable and Non-Viable tissue/material including Exudate, Fibrin/Slough, and Subcutaneous after achieving pain control using Lidocaine 4% Topical Solution. A time out was conducted at 08:23, prior to the start of the procedure. A Minimum amount of bleeding was controlled with Pressure. The procedure was tolerated well with a pain level of 0 throughout and a pain level of 0 following the procedure. Post Debridement Measurements: 3.7cm length x 3.4cm width x 0.2cm depth; 1.976cm^3 volume. Character of Wound/Ulcer Post Debridement requires further debridement. Severity of Tissue Post Debridement is: Fat layer exposed. Post procedure Diagnosis Wound #2: Same as Pre-Procedure Plan Wound Cleansing: Wound #1 Right,Lateral Malleolus: Clean wound with Normal Saline. Cleanse wound with mild soap and  water May Shower, gently pat wound dry  prior to applying new dressing. Wound #2 Right,Medial Malleolus: Clean wound with Normal Saline. Cleanse wound with mild soap and water May Shower, gently pat wound dry prior to applying new dressing. Anesthetic: Wound #1 Right,Lateral Malleolus: Topical Lidocaine 4% cream applied to wound bed prior to debridement Wound #2 Right,Medial Malleolus: Topical Lidocaine 4% cream applied to wound bed prior to debridement Skin Barriers/Peri-Wound Care: Wound #1 Right,Lateral Malleolus: Barrier cream FITZ, MATSUO ([id number]) Wound #2 Right,Medial Malleolus: Barrier cream Primary Wound Dressing: Wound #1 Right,Lateral Malleolus: Cutimed Sorbact Other: - Anicept Gel Wound #2 Right,Medial Malleolus: Cutimed Sorbact Other: - Anicept Gel Secondary Dressing: Wound #1 Right,Lateral Malleolus: ABD pad Conform/Kerlix Drawtex Wound #2 Right,Medial Malleolus: ABD pad Conform/Kerlix Drawtex Dressing Change Frequency: Wound #1 Right,Lateral Malleolus: Change dressing every day. Wound #2 Right,Medial Malleolus: Change dressing every day. Follow-up Appointments: Wound #1 Right,Lateral Malleolus: Return Appointment in 2 weeks. Wound #2 Right,Medial Malleolus: Return Appointment in 2 weeks. Edema Control: Wound #1 Right,Lateral Malleolus: Elevate legs to the level of the heart and pump ankles as often as possible Wound #2 Right,Medial Malleolus: Elevate legs to the level of the heart and pump ankles as often as possible Additional Orders / Instructions: Wound #1 Right,Lateral Malleolus: Increase protein intake. Wound #2 Right,Medial Malleolus: Increase protein intake. Medications-please add to medication list.: Wound #1 Right,Lateral Malleolus: Other: - Vitamin C, Zinc, MVI Wound #2 Right,Medial Malleolus: Other: - Vitamin C, Zinc, MVI #1 I'm going to continue with the Anasept gel and sorbact #2 I would like to consider this patient for  a skin substitute although I have not been able to get the surface of this wound to look like it would supported #3 I believe there is also financial/insurance issues about the patient's skin substitute cost. He has [employer]) TRISTRAM, MILIAN ([id number]) Signed: 06/18/2017 4:56:05 PM By: [name] MD Entered By: [name] on 06/18/2017 08:51:46 [name] ([id number]) -------------------------------------------------------------------------------- SuperBill Details Patient Name: [name] Date of Service: 06/18/2017 Medical Record Number: [id number] Patient Account Number: [id number] Date of Birth/Sex: 05/07/1946 (71 y.o. 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, initial encounter T81.31XD Disruption of external operation (surgical) wound, not elsewhere classified, subsequent encounter L97.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 of skin Facility Procedures CPT4 Code: 62952841 Description: 99213 - WOUND CARE VISIT-LEV 3 EST PT Modifier: Quantity: 1 CPT4 Code: 32440102 Description: 11042 - DEB SUBQ TISSUE 20 SQ CM/< ICD-10 Diagnosis Description L97.212 Non-pressure chronic ulcer of right calf with fat layer expos Modifier: ed Quantity: 1 CPT4 Code: 72536644 Description: 11045 - DEB SUBQ TISS EA ADDL 20CM ICD-10 Diagnosis Description L97.212 Non-pressure chronic ulcer of right calf  with fat layer expos Modifier: ed Quantity: 1 Physician Procedures CPT4 Code: [id number] Description: 11042 - WC PHYS SUBQ TISS 20 SQ CM ICD-10 Diagnosis Description L97.212 Non-pressure chronic ulcer of right calf with fat layer expose Modifier: d Quantity: 1 CPT4 Code: [id number] Description: 11045 - WC PHYS SUBQ TISS EA ADDL 20 CM ICD-10 Diagnosis Description L97.212 Non-pressure chronic ulcer of right calf with fat layer expose Modifier: d Quantity:  1 Electronic Signature(s) Signed: 06/18/2017 9:01:46 AM By: Alejandro Mulling Signed: 06/18/2017 4:56:05 PM By: [name] MD Entered By: Alejandro Mulling on 06/18/2017 09:01:46

## 2017-07-02 ENCOUNTER — Encounter: Payer: Medicare HMO | Attending: Internal Medicine | Admitting: Internal Medicine

## 2017-07-02 DIAGNOSIS — T85613A 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, initial encounter: Secondary | ICD-10-CM | POA: Diagnosis not present

## 2017-07-02 DIAGNOSIS — I87331 Chronic venous hypertension (idiopathic) with ulcer and inflammation of right lower extremity: Secondary | ICD-10-CM | POA: Diagnosis not present

## 2017-07-02 DIAGNOSIS — Z7901 Long term (current) use of anticoagulants: Secondary | ICD-10-CM | POA: Insufficient documentation

## 2017-07-02 DIAGNOSIS — Y832 Surgical operation with anastomosis, bypass or graft as the cause of abnormal reaction of the patient, or of later complication, without mention of misadventure at the time of the procedure: Secondary | ICD-10-CM | POA: Diagnosis not present

## 2017-07-02 DIAGNOSIS — L97212 Non-pressure chronic ulcer of right calf with fat layer exposed: Secondary | ICD-10-CM | POA: Diagnosis not present

## 2017-07-02 DIAGNOSIS — T8131XD Disruption of external operation (surgical) wound, not elsewhere classified, subsequent encounter: Secondary | ICD-10-CM | POA: Insufficient documentation

## 2017-07-02 DIAGNOSIS — Z86718 Personal history of other venous thrombosis and embolism: Secondary | ICD-10-CM | POA: Insufficient documentation

## 2017-07-02 DIAGNOSIS — L97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 of skin: Secondary | ICD-10-CM | POA: Insufficient documentation

## 2017-07-07 NOTE — Progress Notes (Signed)
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 [facility] or polymen AG 02/05/17; still large amounts of drainage and presenting with really large amounts of adherent slough and necrotic material over the remaining open area of the  wound. We have been using Iodoflex but with little improvement in the surface. Change to [facility] 02/12/17; still large amount of drainage. Much less adherent  slough however. Started Hydrofera Blue last week 02/19/17; drainage is better this week. Much less adherent slough. Perhaps some improvement in dimensions. Using [facility] 02/26/17; severe venous insufficiency wounds on the right lateral and right medial leg. Drainage is some better and slough is less adherent we've been using Hydrofera Blue 03/05/17 on evaluation today patient appears to be doing well. His wounds have been decreasing in size and overall he is [name], [name] pleased with how this is progressing. We are awaiting approval for the epigraph which has previously been recommended in the meantime the [facility] Dressing is to be doing very well for him. 03/12/17; wound dimensions are smaller still using Hydrofera Blue. Comes in on Fridays for a dressing change. 03/19/17; wound dimensions continued to contract. Healing of this wound is complicated by continuous significant drainage as well as recurrent buildup of necrotic surface material. We looked into Apligraf and he has a $290 co-pay per application but truthfully I think the drainage as well as the nonviable surface would preclude use of Apligraf there are any other skin substitute at this point therefore the continued plan will be debridement each clinic visit, 2 times a week dressing changes and continued use of Hydrofera Blue. improvement has been very slow but sustained 03/26/17 perhaps slight improvements in peripheral epithelialization is especially inferiorly. Still with large amount of drainage and tightly adherent necrotic surface on arrival. Along with the intake nurse I reviewed previous treatment. He worsened on Iodoflex and had a 4 week trial of sorbact, Polymen AG and a long courses of Aquacel Ag. He is not a candidate for  advanced treatment options for many reasons 04/09/17 on evaluation today patient's right lower extremity wounds appear to be doing a little better. Fortunately he has no significant discomfort and has been tolerating the dressing changes including the wraps without complication. With that being said he really does not have swelling anymore compared to what he has had in the past following his vascular intervention. He wonders if potentially we could attempt avoiding the rats to see if he could cleanse the wound in between to try to prevent some of the fiber and its buildup that occurs in the interim between when we see him week to week. No fevers, chills, nausea, or vomiting noted at this time. 04/16/17; noted that the staff made a choice last week not to put him in compression. The patient is changing his dressing at home using [facility] and changing every second day. His wife is washing the wound with saline. He is using kerlix. Surprisingly he has not developed a lot of edema. This choice was made because of the degree of fluid retention and maceration even with changing his dressing twice a week 04/23/17; absolutely no change. Using Hydrofera Blue. Recurrent tightly adherent nonviable surface material. This is been refractory to Iodoflex, sorbact and now Hydrofera Blue. More recently he has been changing his own dressings at home, cleansing the wound in the shower. He has not developed lower extremity edema 04/30/17; if anything the wound is larger still in adherent surface although it debrided easily today. I've been using Mehta honey for 1 week and I'd like to try and do it for a second week this see if we can get some form of viable surface here. 05/07/17; patient arrives with copious amounts of drainage and some pain in the superior part of the wound. He has not been systemically unwell. He's been changing this daily at home 05/14/17; patient  arrives today complaining of less drainage and  less pain. Dimensions slightly better. Surface culture I did of this last week grew MSSA I put him on dicloxacillin for 10 days. Patient requesting a further prescription since he feels so much better. He did not obtain the [facility] AG for reasons that aren't clear, he has been using Hydrofera Blue 05/21/17; perhaps some less drainage and less pain. He is completing another week of doxycycline. Unfortunately there is no change in the wound measurements are appearance still tightly adherent necrotic surface material that is really defied treatment. We have been using Hydrofera Blue most recently. He did not manage to get Anasept. He was told it was prescription at [facility] 05/29/17; absolutely no improvement in these wound areas. He had remote plastic surgery with who was done at [facility] in [place] surrounding this area. This was a traumatic wound with extensive plastic surgery/skin grafting at that time. I switched him to sorbact last week to see if he can do anything about the recurrent necrotic surface and drainage. This is largely been refractory to Iodoflex/Hydrofera Blue/alginates. I believe we tried Elby Showers and more recently Medi honey l however the drainage is really too excessive 06/04/17; patient went to see Dr. Ulice Bold. Per the patient she ordered him a compression stocking. Continued the Anasept gel and sorbact was already on. No major improvement in the condition of the wounds in fact the medial wound looks larger. Again per the patient she did not feel a skin graft or operative debridement was indicated The patient had previous venous ablation in February 2018. Last arterial studies were in December 2017 and were within normal limits 06/18/17; patient arrives back in clinic today using Anasept gel with sorbact. He changes this every day is wearing his own compression stocking. The patient actually saw Dr. Leta Baptist of plastic surgery. I've reviewed her note. The appointment  was on 05/30/17. The basic issue with here was that she did not feel that skin grafts for patients with underlying stasis and edema have a good long-term success rate. She also discuss skin substitutes which has been done here as well however I have not been able to get the surface of these wounds to something that I think would support skin substitutes. This is why he felt he might need an operative debridement more aggressively than I can do in this clinic Review of systems; is otherwise negative he feels well 07/02/17; patient has been using Anasept gel with Sorbact. He changes this every day scrubs out the wound beds. Arrives today with the wound bed looking some better. Easier debridement [name], [name] ([id number]) Objective Constitutional Vitals Time Taken: 8:06 AM, Height: 76 in, Weight: 238 lbs, BMI: 29, Temperature: 97.7 F, Pulse: 76 bpm, Respiratory Rate: 18 breaths/min, Blood Pressure: 140/73 mmHg. Integumentary (Hair, Skin) Wound #1 status is Open. Original cause of wound was Gradually Appeared. The wound is located on the Right,Lateral Malleolus. The wound measures 4.4cm length x 3.3cm width x 0.2cm depth; 11.404cm^2 area and 2.281cm^3 volume. There is Fat Layer (Subcutaneous Tissue) Exposed exposed. There is no tunneling or undermining noted. There is a large amount of serous drainage noted. The wound margin is distinct with the outline attached to the wound base. There is no granulation within the wound bed. There is a large (67-100%) amount of necrotic tissue within the wound bed including Adherent Slough. The periwound skin appearance exhibited: Scarring, Maceration, Hemosiderin Staining. The periwound skin appearance did not exhibit: Callus, Crepitus, Excoriation, Induration, Rash, Dry/Scaly,  Atrophie Blanche, Cyanosis, Ecchymosis, Mottled, Pallor, Rubor, Erythema. Periwound temperature was noted as No Abnormality. The periwound has tenderness on palpation. Wound #2  status is Open. Original cause of wound was Gradually Appeared. The wound is located on the Right,Medial Malleolus. The wound measures 3.4cm length x 3.2cm width x 0.1cm depth; 8.545cm^2 area and 0.855cm^3 volume. There is Fat Layer (Subcutaneous Tissue) Exposed exposed. There is no tunneling or undermining noted. There is a large amount of serous drainage noted. The wound margin is distinct with the outline attached to the wound base. There is no granulation within the wound bed. There is a large (67-100%) amount of necrotic tissue within the wound bed including Adherent Slough. The periwound skin appearance exhibited: Scarring, Maceration, Hemosiderin Staining. The periwound skin appearance did not exhibit: Callus, Crepitus, Excoriation, Induration, Rash, Dry/Scaly, Atrophie Blanche, Cyanosis, Ecchymosis, Mottled, Pallor, Rubor, Erythema. Periwound temperature was noted as No Abnormality. The periwound has tenderness on palpation. 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 There was a Skin/Subcutaneous Tissue Debridement ([id number]) debridement with [name], [name] ([id number]) total area of 14.52 sq cm performed by [name], MD. with the following instrument(s): scoop to  remove Viable and Non-Viable tissue/material including Exudate, Fibrin/Slough, and Subcutaneous after achieving pain control using Lidocaine 4% Topical Solution. A time out was conducted at 08:24, prior to the start of the procedure. A Minimum amount of bleeding was controlled with Pressure. The procedure was tolerated well with a pain level of 0 throughout and a pain level of 0 following the procedure. Post Debridement Measurements: 4.4cm length x 3.3cm width x 0.3cm depth; 3.421cm^3 volume. Character of Wound/Ulcer Post Debridement requires further debridement. Severity of Tissue Post Debridement is: Fat layer exposed. 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 There was a Skin/Subcutaneous Tissue Debridement ([id number]) debridement with total area of 10.88 sq cm performed by [name], MD. with the following instrument(s): scoop to remove Viable and Non-Viable tissue/material including Exudate, Fibrin/Slough, and Subcutaneous after achieving pain control using Lidocaine 4% Topical Solution. A time out was conducted at 08:24, prior to the start of the procedure. A Minimum amount of bleeding was controlled with Pressure. The procedure was tolerated well with a pain level of 0 throughout and a pain level of 0 following the procedure. Post Debridement Measurements: 3.4cm length x 3.2cm width x 0.2cm depth; 1.709cm^3 volume. Character of Wound/Ulcer Post Debridement is stable. Severity of Tissue Post Debridement is: Fat layer exposed. Post procedure Diagnosis Wound #2: Same as Pre-Procedure Plan Wound Cleansing: Wound #1 Right,Lateral Malleolus: Clean wound with Normal Saline. Cleanse wound with mild soap and water May Shower, gently pat wound dry prior to applying new dressing. Wound #2 Right,Medial  Malleolus: Clean wound with Normal Saline. Cleanse wound with mild  soap and water May Shower, gently pat wound dry prior to applying new dressing. Anesthetic: Wound #1 Right,Lateral Malleolus: Topical Lidocaine 4% cream applied to wound bed prior to debridement Wound #2 Right,Medial Malleolus: Topical Lidocaine 4% cream applied to wound bed prior to debridement Skin Barriers/Peri-Wound Care: Wound #1 Right,Lateral Malleolus: Barrier cream Wound #2 Right,Medial Malleolus: Barrier cream Primary Wound Dressing: Wound #1 Right,Lateral Malleolus: Hydrogel - Hydrogel used in office Cutimed Sorbact Other: - Anicept Gel at home Wound #2 Right,Medial Malleolus: Hydrogel - in office Cutimed Sorbact Other: - Anicept Gel at home Secondary Dressing: Wound #1 Right,Lateral Malleolus: ABD pad [name], [name] Conform/Kerlix Drawtex Wound #2 Right,Medial Malleolus: ABD pad Conform/Kerlix Drawtex Dressing Change Frequency: Wound #1 Right,Lateral Malleolus: Change dressing every day. Wound #2 Right,Medial Malleolus: Change dressing every day. Follow-up Appointments: Wound #1 Right,Lateral Malleolus: Return Appointment in 2 weeks. Wound #2 Right,Medial Malleolus: Return Appointment in 2 weeks. Edema Control: Wound #1 Right,Lateral Malleolus: Elevate legs to the level of the heart and pump ankles as often as possible Wound #2 Right,Medial Malleolus: Elevate legs to the level of the heart and pump ankles as often as possible Additional Orders / Instructions: Wound #1 Right,Lateral Malleolus: Increase protein intake. Wound #2 Right,Medial Malleolus: Increase protein intake. Medications-please add to medication list.: Wound #1 Right,Lateral Malleolus: Other: - Vitamin C, Zinc, MVI Wound #2 Right,Medial Malleolus: Other: - Vitamin C, Zinc, MVI #1 continue Anasept gel with Sorbact #2I don't really have a plan B here. I was hoping to get somebody to agreed to do an  operative debridement on him with a cell placement although plastic surgery in [place] did not agree to thisor at least did not comment on it. They did not want to do a skin graft because of the venous insufficiency. Perhaps seeking a second opinion Electronic Signature(s) Signed: 07/02/2017 9:07:08 AM By: [name] MD Entered By: [name] on 07/02/2017 09:07:07 [name] ([id number]) -------------------------------------------------------------------------------- SuperBill Details Patient Name: [name] Date of Service: 07/02/2017 Medical Record Number: [id number] Patient Account Number: [id number] Date of Birth/Sex: [birth year] (71 y.o. 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, initial encounter T81.31XD Disruption of external operation (surgical) wound, not elsewhere classified, subsequent encounter L97.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 of skin Facility Procedures CPT4 Code: 08657846 Description: 11042 - DEB SUBQ TISSUE 20 SQ CM/< ICD-10 Diagnosis Description L97.212 Non-pressure chronic ulcer of right calf with fat layer expos Modifier: ed Quantity: 1 CPT4 Code: [id number] Description: 11045 - DEB SUBQ TISS EA ADDL 20CM ICD-10 Diagnosis Description L97.212 Non-pressure chronic ulcer of right calf with fat layer expos Modifier: ed Quantity: 1 Physician Procedures CPT4 Code: 1324401 Description:  11042 - WC PHYS SUBQ TISS 20 SQ CM ICD-10 Diagnosis Description L97.212 Non-pressure chronic ulcer of right calf with fat layer expose Modifier: d Quantity: 1 CPT4 Code: [id number] Description: 11045 - WC PHYS SUBQ TISS EA ADDL 20 CM ICD-10 Diagnosis Description L97.212 Non-pressure chronic ulcer of right calf with fat layer expose Modifier: d Quantity: 1 Electronic Signature(s) Signed: 07/03/2017 8:07:20 AM By: [name] MD Entered By:  [name] on 07/02/2017 08:38:49

## 2017-07-07 NOTE — Progress Notes (Signed)
[name], Kolin C. ([id number]) Visit Report for 07/02/2017 Arrival Information Details Patient Name: [name], [name] Date of Service: 07/02/2017 8:00 AM Medical Record Number: [id number] Patient Account Number: [id number] Date of Birth/Sex: 09-07-1945 [age] y.o. Male) Treating RN: Ashok CordiaPinkerton, [name] Primary Care Joyice Magda: Darreld McleanMILES, LINDA Other Clinician: Referring Deondrea Aguado: Darreld McleanMILES, LINDA Treating Michaelia Beilfuss/Extender: Altamese CarolinaOBSON, [name] G Weeks in Treatment: 76 Visit Information History Since Last Visit All ordered tests and consults were completed: No Patient Arrived: Ambulatory Added or deleted any medications: No Arrival Time: 08:05 Any new allergies or adverse reactions: No Accompanied By: self Had a fall or experienced change in No Transfer Assistance: None activities of daily living that may affect Patient Identification Verified: Yes risk of falls: Secondary Verification Process Yes Signs or symptoms of abuse/neglect since last visito No Completed: Hospitalized since last visit: No Patient Requires Transmission-Based No Has Dressing in Place as Prescribed: Yes Precautions: Pain Present Now: No Patient Has Alerts: Yes Patient Alerts: Patient on Blood Thinner Electronic Signature(s) Signed: 07/05/2017 4:36:47 PM By: Alejandro MullingPinkerton, Debra Entered By: Alejandro MullingPinkerton, Debra on 07/02/2017 08:06:43 [name], [name] ([id number]) -------------------------------------------------------------------------------- Encounter Discharge Information Details Patient Name: [name], [name] Date of Service: 07/02/2017 8:00 AM Medical Record Number: [id number] Patient Account Number: [id number] Date of Birth/Sex: 09-07-1945 [age] y.o. Male) Treating RN: Phillis HaggisPinkerton, [name] Primary Care Gianlucas Evenson: Darreld McleanMILES, LINDA Other Clinician: Referring Kathryn Linarez: Darreld McleanMILES, LINDA Treating Molli Gethers/Extender: Maxwell CaulOBSON, [name] G Weeks in Treatment: [id number] Encounter Discharge Information Items Discharge Pain Level: 0 Discharge  Condition: Stable Ambulatory Status: Ambulatory Discharge Destination: Home Private Transportation: Auto Accompanied By: self Schedule Follow-up Appointment: Yes Medication Reconciliation completed and provided No to Patient/Care Shamona Wirtz: Clinical Summary of Care: Electronic Signature(s) Signed: 07/05/2017 4:36:47 PM By: Alejandro MullingPinkerton, Debra Entered By: Alejandro MullingPinkerton, Debra on 07/02/2017 09:00:39 [name], [name] ([id number]) -------------------------------------------------------------------------------- Lower Extremity Assessment Details Patient Name: [name], [name] Date of Service: 07/02/2017 8:00 AM Medical Record Number: [id number] Patient Account Number: [id number] Date of Birth/Sex: 09-07-1945 [age] y.o. Male) Treating RN: Phillis HaggisPinkerton, [name] Primary Care Georgian Mcclory: Darreld McleanMILES, LINDA Other Clinician: Referring Kathye Cipriani: Darreld McleanMILES, LINDA Treating Arleene Settle/Extender: Maxwell CaulOBSON, [name] G Weeks in Treatment: 76 Vascular Assessment Pulses: Dorsalis Pedis Palpable: [Right:Yes] Posterior Tibial Extremity colors, hair growth, and conditions: Extremity Color: [Right:Normal] Temperature of Extremity: [Right:Warm] Capillary Refill: [Right:< 3 seconds] Toe Nail Assessment Left: Right: Thick: Yes Discolored: Yes Deformed: No Improper Length and Hygiene: Yes Electronic Signature(s) Signed: 07/05/2017 4:36:47 PM By: Alejandro MullingPinkerton, Debra Entered By: Alejandro MullingPinkerton, Debra on 07/02/2017 08:24:15 [name], [name] ([id number]) -------------------------------------------------------------------------------- Multi Wound Chart Details Patient Name: [name], [name] Date of Service: 07/02/2017 8:00 AM Medical Record Number: [id number] Patient Account Number: [id number] Date of Birth/Sex: 09-07-1945 [age] y.o. Male) Treating RN: Phillis HaggisPinkerton, [name] Primary Care Anysha Frappier: Darreld McleanMILES, LINDA Other Clinician: Referring Iann Rodier: Darreld McleanMILES, LINDA Treating Sarahann Horrell/Extender: Maxwell CaulOBSON, [name] G Weeks in Treatment: 76 Vital  Signs Height(in): 76 Pulse(bpm): 76 Weight(lbs): 238 Blood Pressure(mmHg): 140/73 Body Mass Index(BMI): 29 Temperature(F): 97.7 Respiratory Rate 18 (breaths/min): Photos: [1:No Photos] [2:No Photos] [N/A:N/A] Wound Location: [1:Right Malleolus - Lateral] [2:Right Malleolus - Medial] [N/A:N/A] Wounding Event: [1:Gradually Appeared] [2:Gradually Appeared] [N/A:N/A] Primary Etiology: [1:Venous Leg Ulcer] [2:Venous Leg Ulcer] [N/A:N/A] Comorbid History: [1:Cataracts, Chronic Obstructive Cataracts, Chronic Obstructive N/A Pulmonary Disease (COPD), Pulmonary Disease (COPD), Osteoarthritis] [2:Osteoarthritis] Date Acquired: [1:08/11/2015] [2:01/30/2016] [N/A:N/A] Weeks of Treatment: [1:76] [2:74] [N/A:N/A] Wound Status: [1:Open] [2:Open] [N/A:N/A] Clustered Wound: [1:No] [2:Yes] [N/A:N/A] Measurements L x W x D [1:4.4x3.3x0.2] [2:3.4x3.2x0.1] [N/A:N/A] (cm) Area (cm) : [1:11.404] [2:8.545] [N/A:N/A] Volume (cm) : [1:2.281] [2:0.855] [N/A:N/A] % Reduction in Area: [1:34.00%] [2:26.00%] [N/A:N/A] % Reduction  in Volume: [1:56.00%] [2:26.00%] [N/A:N/A] Classification: [1:Full Thickness Without Exposed Support Structures] [2:Full Thickness Without Exposed Support Structures] [N/A:N/A] Exudate Amount: [1:Large] [2:Large] [N/A:N/A] Exudate Type: [1:Serous] [2:Serous] [N/A:N/A] Exudate Color: [1:amber] [2:amber] [N/A:N/A] Wound Margin: [1:Distinct, outline attached] [2:Distinct, outline attached] [N/A:N/A] Granulation Amount: [1:None Present (0%)] [2:None Present (0%)] [N/A:N/A] Necrotic Amount: [1:Large (67-100%)] [2:Large (67-100%)] [N/A:N/A] Exposed Structures: [1:Fat Layer (Subcutaneous Tissue) Exposed: Yes Fascia: No Tendon: No Muscle: No Joint: No Bone: No] [2:Fat Layer (Subcutaneous Tissue) Exposed: Yes Fascia: No Tendon: No Muscle: No Joint: No Bone: No] [N/A:N/A] Epithelialization: [1:Small (1-33%)] [2:None] [N/A:N/A] Debridement: [1:Debridement (11042-11047)] [2:Debridement  (11042-11047)] [N/A:N/A] Pre-procedure [1:08:24] [2:08:24] [N/A:N/A] Verification/Time Out Taken: Pain Control: [1:Lidocaine 4% Topical Solution Lidocaine 4% Topical Solution] [N/A:N/A] Tissue Debrided: Fibrin/Slough, Exudates, Fibrin/Slough, Exudates, N/A Subcutaneous Subcutaneous Level: Skin/Subcutaneous Tissue Skin/Subcutaneous Tissue N/A Debridement Area (sq cm): 14.52 10.88 N/A Instrument: Other(scoop) Other(scoop) N/A Bleeding: Minimum Minimum N/A Hemostasis Achieved: Pressure Pressure N/A Procedural Pain: 0 0 N/A Post Procedural Pain: 0 0 N/A Debridement Treatment Procedure was tolerated well Procedure was tolerated well N/A Response: Post Debridement 4.4x3.3x0.3 3.4x3.2x0.2 N/A Measurements L x W x D (cm) Post Debridement Volume: 3.421 1.709 N/A (cm) Periwound Skin Texture: Scarring: Yes Scarring: Yes N/A Excoriation: No Excoriation: No Induration: No Induration: No Callus: No Callus: No Crepitus: No Crepitus: No Rash: No Rash: No Periwound Skin Moisture: Maceration: Yes Maceration: Yes N/A Dry/Scaly: No Dry/Scaly: No Periwound Skin Color: Hemosiderin Staining: Yes Hemosiderin Staining: Yes N/A Atrophie Blanche: No Atrophie Blanche: No Cyanosis: No Cyanosis: No Ecchymosis: No Ecchymosis: No Erythema: No Erythema: No Mottled: No Mottled: No Pallor: No Pallor: No Rubor: No Rubor: No Temperature: No Abnormality No Abnormality N/A Tenderness on Palpation: Yes Yes N/A Wound Preparation: Ulcer Cleansing: Ulcer Cleansing: N/A Rinsed/Irrigated with Saline Rinsed/Irrigated with Saline Topical Anesthetic Applied: Topical Anesthetic Applied: Other: lidocaine 4% Other: lidocaine 4% Procedures Performed: Debridement Debridement N/A Treatment Notes Electronic Signature(s) Signed: 07/03/2017 8:07:20 AM By: [name] MD Entered By: [name] on 07/02/2017 08:34:45 [name] Stack  ([id number]) -------------------------------------------------------------------------------- Multi-Disciplinary Care Plan Details Patient Name: [name] Stack Date of Service: 07/02/2017 8:00 AM Medical Record Number: [id number] Patient Account Number: [id number] Date of Birth/Sex: [birth year] (71 y.o. Male) Treating RN: [name] Primary Care Kemani Demarais: [name] Other Clinician: Referring Maimuna Leaman: [name] Treating Aleene Swanner/Extender: [name] Weeks in Treatment: 76 Active Inactive ` Venous Leg Ulcer Nursing Diagnoses: Knowledge deficit related to disease process and management Goals: Patient will maintain optimal edema control Date Initiated: 04/03/2016 Target Resolution Date: 11/24/2016 Goal Status: Active Interventions: Compression as ordered Treatment Activities: Therapeutic compression applied : 04/03/2016 Notes: ` Wound/Skin Impairment Nursing Diagnoses: Impaired tissue integrity Goals: Ulcer/skin breakdown will heal within 14 weeks Date Initiated: 01/17/2016 Target Resolution Date: 11/24/2016 Goal Status: Active Interventions: Assess ulceration(s) every visit Notes: Electronic Signature(s) Signed: 07/05/2017 4:36:47 PM By: Alejandro Mulling Entered By: Alejandro Mulling on 07/02/2017 08:24:29 [name] Stack ([id number]) -------------------------------------------------------------------------------- Pain Assessment Details Patient Name: [name] Stack Date of Service: 07/02/2017 8:00 AM Medical Record Number: [id number] Patient Account Number: [id number] Date of Birth/Sex: 12/12/1945 (71 y.o. Male) Treating RN: [name] Primary Care Johnnay Pleitez: [name] Other Clinician: Referring Sanye Ledesma: [name] Treating Deoni Cosey/Extender: [name] Weeks in Treatment: 76 Active Problems Location of Pain Severity and Description of Pain Patient Has Paino No Site Locations Pain Management and Medication Current Pain  Management: Electronic Signature(s) Signed: 07/05/2017 4:36:47 PM By: Alejandro Mulling Entered By: Alejandro Mulling on 07/02/2017 08:06:50 [name] Stack ([id number]) -------------------------------------------------------------------------------- Patient/Caregiver Education  Details Patient Name: [name], [name] Date of Service: 07/02/2017 8:00 AM Medical Record Number: [id number] Patient Account Number: [id number] Date of Birth/Gender: 1946/06/23 [age] y.o. Male) Treating RN: Phillis HaggisPinkerton, [name] Primary Care Physician: Darreld McleanMILES, LINDA Other Clinician: Referring Physician: Darreld McleanMILES, LINDA Treating Physician/Extender: Altamese CarolinaOBSON, [name] G Weeks in Treatment: 1176 Education Assessment Education Provided To: Patient Education Topics Provided Wound/Skin Impairment: Handouts: Other: change dressing as ordered Methods: Demonstration, Explain/Verbal Responses: State content correctly Electronic Signature(s) Signed: 07/05/2017 4:36:47 PM By: Alejandro MullingPinkerton, Debra Entered By: Alejandro MullingPinkerton, Debra on 07/02/2017 09:01:04 [name], Josef C. ([id number]) -------------------------------------------------------------------------------- Wound Assessment Details Patient Name: [name], Kanden C. Date of Service: 07/02/2017 8:00 AM Medical Record Number: [id number] Patient Account Number: [id number] Date of Birth/Sex: 1946/06/23 [age] y.o. Male) Treating RN: Ashok CordiaPinkerton, [name] Primary Care Rilda Bulls: Darreld McleanMILES, LINDA Other Clinician: Referring Ladaisha Portillo: Darreld McleanMILES, LINDA Treating Gloria Ricardo/Extender: Maxwell CaulOBSON, [name] G Weeks in Treatment: 76 Wound Status Wound Number: 1 Primary Venous Leg Ulcer Etiology: Wound Location: Right Malleolus - Lateral Wound Open Wounding Event: Gradually Appeared Status: Date Acquired: 08/11/2015 Comorbid Cataracts, Chronic Obstructive Pulmonary Weeks Of Treatment: 76 History: Disease (COPD), Osteoarthritis Clustered Wound: No Photos Photo Uploaded By: Alejandro MullingPinkerton, Debra on 07/02/2017  09:17:33 Wound Measurements Length: (cm) 4.4 Width: (cm) 3.3 Depth: (cm) 0.2 Area: (cm) 11.404 Volume: (cm) 2.281 % Reduction in Area: 34% % Reduction in Volume: 56% Epithelialization: Small (1-33%) Tunneling: No Undermining: No Wound Description Full Thickness Without Exposed Support Classification: Structures Wound Margin: Distinct, outline attached Exudate Large Amount: Exudate Type: Serous Exudate Color: amber Foul Odor After Cleansing: No Slough/Fibrino Yes Wound Bed Granulation Amount: None Present (0%) Exposed Structure Necrotic Amount: Large (67-100%) Fascia Exposed: No Necrotic Quality: Adherent Slough Fat Layer (Subcutaneous Tissue) Exposed: Yes Tendon Exposed: No Muscle Exposed: No Joint Exposed: No Bone Exposed: No Kanaan, Verdie C. ([id number]) Periwound Skin Texture Texture Color No Abnormalities Noted: No No Abnormalities Noted: No Callus: No Atrophie Blanche: No Crepitus: No Cyanosis: No Excoriation: No Ecchymosis: No Induration: No Erythema: No Rash: No Hemosiderin Staining: Yes Scarring: Yes Mottled: No Pallor: No Moisture Rubor: No No Abnormalities Noted: No Dry / Scaly: No Temperature / Pain Maceration: Yes Temperature: No Abnormality Tenderness on Palpation: Yes Wound Preparation Ulcer Cleansing: Rinsed/Irrigated with Saline Topical Anesthetic Applied: Other: lidocaine 4%, Treatment Notes Wound #1 (Right, Lateral Malleolus) 1. Cleansed with: Clean wound with Normal Saline Cleanse wound with antibacterial soap and water 2. Anesthetic Topical Lidocaine 4% cream to wound bed prior to debridement 4. Dressing Applied: Other dressing (specify in notes) 5. Secondary Dressing Applied ABD Pad Kerlix/Conform 6. Footwear/Offloading device applied Surgical shoe 7. Secured with Tape Notes sorbact, anacept gel, drawtex, darco shoe Electronic Signature(s) Signed: 07/05/2017 4:36:47 PM By: Alejandro MullingPinkerton, Debra Entered By:  Alejandro MullingPinkerton, Debra on 07/02/2017 08:18:19 [name], Kain C. ([id number]) -------------------------------------------------------------------------------- Wound Assessment Details Patient Name: [name], Ranferi C. Date of Service: 07/02/2017 8:00 AM Medical Record Number: [id number] Patient Account Number: [id number] Date of Birth/Sex: 1946/06/23 [age] y.o. Male) Treating RN: Ashok CordiaPinkerton, [name] Primary Care Francesca Strome: Darreld McleanMILES, LINDA Other Clinician: Referring Latia Mataya: Darreld McleanMILES, LINDA Treating Conny Moening/Extender: Maxwell CaulOBSON, [name] G Weeks in Treatment: 76 Wound Status Wound Number: 2 Primary Venous Leg Ulcer Etiology: Wound Location: Right Malleolus - Medial Wound Open Wounding Event: Gradually Appeared Status: Date Acquired: 01/30/2016 Comorbid Cataracts, Chronic Obstructive Pulmonary Weeks Of Treatment: 74 History: Disease (COPD), Osteoarthritis Clustered Wound: Yes Photos Photo Uploaded By: Alejandro MullingPinkerton, Debra on 07/02/2017 09:17:34 Wound Measurements Length: (cm) 3.4 Width: (cm) 3.2 Depth: (cm) 0.1 Area: (cm) 8.545 Volume: (cm) 0.855 % Reduction in Area: 26% % Reduction  in Volume: 26% Epithelialization: None Tunneling: No Undermining: No Wound Description Full Thickness Without Exposed Support Classification: Structures Wound Margin: Distinct, outline attached Exudate Large Amount: Exudate Type: Serous Exudate Color: amber Foul Odor After Cleansing: No Slough/Fibrino Yes Wound Bed Granulation Amount: None Present (0%) Exposed Structure Necrotic Amount: Large (67-100%) Fascia Exposed: No Necrotic Quality: Adherent Slough Fat Layer (Subcutaneous Tissue) Exposed: Yes Tendon Exposed: No Muscle Exposed: No Joint Exposed: No Bone Exposed: No Malina, Ravis C. ([id number]) Periwound Skin Texture Texture Color No Abnormalities Noted: No No Abnormalities Noted: No Callus: No Atrophie Blanche: No Crepitus: No Cyanosis: No Excoriation: No Ecchymosis: No Induration:  No Erythema: No Rash: No Hemosiderin Staining: Yes Scarring: Yes Mottled: No Pallor: No Moisture Rubor: No No Abnormalities Noted: No Dry / Scaly: No Temperature / Pain Maceration: Yes Temperature: No Abnormality Tenderness on Palpation: Yes Wound Preparation Ulcer Cleansing: Rinsed/Irrigated with Saline Topical Anesthetic Applied: Other: lidocaine 4%, Treatment Notes Wound #2 (Right, Medial Malleolus) 1. Cleansed with: Clean wound with Normal Saline Cleanse wound with antibacterial soap and water 2. Anesthetic Topical Lidocaine 4% cream to wound bed prior to debridement 4. Dressing Applied: Other dressing (specify in notes) 5. Secondary Dressing Applied ABD Pad Kerlix/Conform 6. Footwear/Offloading device applied Surgical shoe 7. Secured with Tape Notes sorbact, anacept gel, drawtex, darco shoe Electronic Signature(s) Signed: 07/05/2017 4:36:47 PM By: Alejandro Mulling Entered By: Alejandro Mulling on 07/02/2017 08:17:15 [name] Stack ([id number]) -------------------------------------------------------------------------------- Vitals Details Patient Name: [name] Stack Date of Service: 07/02/2017 8:00 AM Medical Record Number: [id number] Patient Account Number: [id number] Date of Birth/Sex: August 13, 1946 (71 y.o. Male) Treating RN: Ashok Cordia, [name] Primary Care Gianno Volner: [name] Other Clinician: Referring Quantez Schnyder: [name] Treating Alonie Gazzola/Extender: [name] Weeks in Treatment: 76 Vital Signs Time Taken: 08:06 Temperature (F): 97.7 Height (in): 76 Pulse (bpm): 76 Weight (lbs): 238 Respiratory Rate (breaths/min): 18 Body Mass Index (BMI): 29 Blood Pressure (mmHg): 140/73 Reference Range: 80 - 120 mg / dl Electronic Signature(s) Signed: 07/05/2017 4:36:47 PM By: Alejandro Mulling Entered By: Alejandro Mulling on 07/02/2017 08:09:35

## 2017-07-16 ENCOUNTER — Encounter: Payer: Medicare HMO | Admitting: Internal Medicine

## 2017-07-16 DIAGNOSIS — I87331 Chronic venous hypertension (idiopathic) with ulcer and inflammation of right lower extremity: Secondary | ICD-10-CM | POA: Diagnosis not present

## 2017-07-18 NOTE — Progress Notes (Signed)
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 measuring slightly smaller. Currently fact on Iodoflex although  I'm looking forward to changing back to perhaps [facility] or polymen AG 02/05/17; still large amounts of drainage and presenting with really large amounts of  adherent slough and necrotic material over the remaining open area of the wound. We have been using Iodoflex but with little improvement in the surface. Change to [facility] 02/12/17; still large amount of drainage. Much less adherent slough however. Started Hydrofera Blue last week 02/19/17; drainage is better this week. Much less adherent slough. Perhaps some improvement in dimensions. Using [facility] 02/26/17; severe venous insufficiency wounds on the right lateral and right medial leg. Drainage is some better and slough is less adherent we've been using Hydrofera Blue 03/05/17 on evaluation today patient appears to be doing well. His wounds have been decreasing in size and overall he is Dimichele, Kacee C. ([id number]) pleased with how this is progressing. We are awaiting approval for the epigraph which has previously been recommended in the meantime the [facility] Dressing is to be doing very well for him. 03/12/17; wound dimensions are smaller still using Hydrofera Blue. Comes in on Fridays for a dressing change. 03/19/17; wound dimensions continued to contract. Healing of this wound is complicated by continuous significant drainage as well as recurrent buildup of necrotic surface material. We looked into Apligraf and he has a $290 co-pay per application but truthfully I think the drainage as well as the nonviable surface would preclude use of Apligraf there are any other skin substitute at this point therefore the continued plan will be debridement each clinic visit, 2 times a week dressing changes and continued use of Hydrofera Blue. improvement has been very slow but sustained 03/26/17 perhaps slight improvements in peripheral epithelialization is especially inferiorly. Still with large amount of drainage and tightly  adherent necrotic surface on arrival. Along with the intake nurse I reviewed previous treatment. He worsened on Iodoflex and had a 4 week trial of sorbact, Polymen AG and a long courses of Aquacel Ag. He is not a candidate for advanced treatment options for many reasons 04/09/17 on evaluation today patient's right lower extremity wounds appear to be doing a little better. Fortunately he has no significant discomfort and has been tolerating the dressing changes including the wraps without complication. With that being said he really does not have swelling anymore compared to what he has had in the past following his vascular intervention. He wonders if potentially we could attempt avoiding the rats to see if he could cleanse the wound in between to try to prevent some of the fiber and its buildup that occurs in the interim between when we see him week to week. No fevers, chills, nausea, or vomiting noted at this time. 04/16/17; noted that the staff made a choice last week not to put him in compression. The patient is changing his dressing at home using [facility] and changing every second day. His wife is washing the wound with saline. He is using kerlix. Surprisingly he has not developed a lot of edema. This choice was made because of the degree of fluid retention and maceration even with changing his dressing twice a week 04/23/17; absolutely no change. Using Hydrofera Blue. Recurrent tightly adherent nonviable surface material. This is been refractory to Iodoflex, sorbact and now Hydrofera Blue. More recently he has been changing his own dressings at home, cleansing the wound in the shower. He has not developed lower extremity edema 04/30/17; if anything the wound is larger still in adherent surface although it debrided easily today. I've been using Mehta honey for 1 week and I'd like to try and do it for a second week this see if we can get some  form of viable surface here. 05/07/17; patient  arrives with copious amounts of drainage and some pain in the superior part of the wound. He has not been systemically unwell. He's been changing this daily at home 05/14/17; patient arrives today complaining of less drainage and less pain. Dimensions slightly better. Surface culture I did of this last week grew MSSA I put him on dicloxacillin for 10 days. Patient requesting a further prescription since he feels so much better. He did not obtain the [facility] for reasons that aren't clear, he has been using Hydrofera Blue 05/21/17; perhaps some less drainage and less pain. He is completing another week of doxycycline. Unfortunately there is no change in the wound measurements are appearance still tightly adherent necrotic surface material that is really defied treatment. We have been using Hydrofera Blue most recently. He did not manage to get Anasept. He was told it was prescription at [facility] 05/29/17; absolutely no improvement in these wound areas. He had remote plastic surgery with who was done at [facility] in [place] surrounding this area. This was a traumatic wound with extensive plastic surgery/skin grafting at that time. I switched him to sorbact last week to see if he can do anything about the recurrent necrotic surface and drainage. This is largely been refractory to Iodoflex/Hydrofera Blue/alginates. I believe we tried Elby Showers and more recently Medi honey l however the drainage is really too excessive 06/04/17; patient went to see Dr. Ulice Bold. Per the patient she ordered him a compression stocking. Continued the Anasept gel and sorbact was already on. No major improvement in the condition of the wounds in fact the medial wound looks larger. Again per the patient she did not feel a skin graft or operative debridement was indicated The patient had previous venous ablation in February 2018. Last arterial studies were in December 2017 and were within normal limits 06/18/17; patient  arrives back in clinic today using Anasept gel with sorbact. He changes this every day is wearing his own compression stocking. The patient actually saw Dr. Leta Baptist of plastic surgery. I've reviewed her note. The appointment was on 05/30/17. The basic issue with here was that she did not feel that skin grafts for patients with underlying stasis and edema have a good long-term success rate. She also discuss skin substitutes which has been done here as well however I have not been able to get the surface of these wounds to something that I think would support skin substitutes. This is why he felt he might need an operative debridement more aggressively than I can do in this clinic Review of systems; is otherwise negative he feels well 07/02/17; patient has been using Anasept gel with Sorbact. He changes this every day scrubs out the wound beds. Arrives today with the wound bed looking some better. Easier debridement 07/16/17; patient has been using Anasept gel was sorbact for about a month now. The necrotic service on his wound bed is Strawser, Anastacio C. ([id number]) better and the drainage is now down to minimal but we've not made any improvements in the epithelialization. Change to Santyl today. Surface of the wound is still not good enough to support skin substitutes Objective Constitutional Sitting or standing Blood Pressure is within target range for patient.. Pulse regular and within target range for patient.Marland Kitchen Respirations regular, non-labored and within target range.. Temperature is normal and within the target range for the patient.Marland Kitchen appears in no distress. Vitals Time Taken: 8:05 AM, Height: 76 in, Weight: 238 lbs, BMI: 29,  Temperature: 98.4 F, Pulse: 92 bpm, Respiratory Rate: 18 breaths/min, Blood Pressure: 141/70 mmHg. Cardiovascular Pedal pulses are palpable on the right. Edema is well controlled. General Notes: Wound exam; not much change in the overall appearance of the wound on  either side right lateral lower right medial however debridement is easier. There is no evidence of surrounding infection. 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 Still has adherent debris that will not allow epithelialization debrided with an open curette. He tolerates this marginally due to pain. Hemostasis with direct pressure Integumentary (Hair, Skin) Wound #1 status is Open. Original cause of wound was Gradually Appeared. The wound is located on the Right,Lateral Malleolus. The wound measures 4.5cm length x 3cm width x 0.2cm depth; 10.603cm^2 area and 2.121cm^3 volume. There is Fat Layer (Subcutaneous Tissue) Exposed exposed. There is no tunneling or undermining noted. There is a large amount of serous drainage noted. The wound margin is distinct with the outline attached to the wound base. There is small (1-33%) red granulation within the wound bed. There is a large (67-100%) amount of necrotic tissue within the wound bed including Adherent Slough. The periwound skin appearance exhibited: Scarring, Maceration, Hemosiderin Staining. The periwound skin appearance did not exhibit: Callus, Crepitus, Excoriation, Induration, Rash, Dry/Scaly, Atrophie Blanche, Cyanosis, Ecchymosis, Mottled, Pallor, Rubor, Erythema. Periwound temperature was noted as No Abnormality. The periwound has tenderness on palpation. Wound #2 status is Open. Original cause of wound was Gradually Appeared. The wound is located on the Right,Medial Malleolus. The wound measures 3.5cm length x 3.1cm width x 0.1cm depth; 8.522cm^2 area and 0.852cm^3 volume. There is Fat Layer (Subcutaneous Tissue) Exposed exposed. There is no tunneling or undermining noted. There is a large amount of serous drainage noted. The wound margin is distinct with the outline attached to the wound base. There is small (1-33%) red granulation within the wound bed. There is a large (67-100%) amount of necrotic tissue within the wound bed  including Adherent Slough. The periwound skin appearance exhibited: Scarring, Maceration, Hemosiderin Staining. The periwound skin appearance did not exhibit: Callus, Crepitus, Excoriation, Induration, Rash, Dry/Scaly, Atrophie Blanche, Cyanosis, Ecchymosis, Mottled, Pallor, Rubor, Erythema. Periwound temperature was noted as No Abnormality. The periwound has tenderness on palpation. 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 There was a Skin/Subcutaneous Tissue Debridement ([id number]) debridement with total area of 13.5 sq cm performed by [name], MD. with the following instrument(s): scoop to remove Viable and Non-Viable tissue/material including Exudate, Fibrin/Slough, and Subcutaneous after achieving pain control using Lidocaine 4% Topical Solution. A time out was conducted at 08:20, prior to the start of the procedure. A Minimum amount of bleeding was controlled with Pressure. The procedure was tolerated well with a pain level of 0 throughout and a pain level of 0 following the procedure. Post Debridement Measurements: 4.5cm length x 3cm width x 0.3cm depth; 3.181cm^3 volume. Character of Wound/Ulcer Post  Debridement requires further  debridement. Severity of Tissue Post Debridement is: Fat layer exposed. 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 There was a Skin/Subcutaneous Tissue Debridement ([id number]) debridement with total area of 10.85 sq cm performed by [name], MD. with the following instrument(s): scoop to remove Viable and Non-Viable tissue/material including Exudate, Fibrin/Slough, and Subcutaneous after achieving pain control using Lidocaine 4% Topical Solution. A time out was conducted at 08:20, prior to the start of the procedure. A Minimum amount of bleeding was controlled with Pressure. The procedure was tolerated well with a pain level of 0 throughout and a pain level of 0 following the procedure. Post Debridement Measurements: 3.5cm length x 3.1cm width x 0.2cm depth; 1.704cm^3 volume. Character of Wound/Ulcer Post Debridement requires further debridement. Severity of Tissue Post Debridement is: Fat layer exposed. Post procedure Diagnosis Wound #2: Same as Pre-Procedure Plan Wound Cleansing: Wound #1 Right,Lateral Malleolus: Clean wound with Normal Saline. Cleanse wound with mild soap and water May Shower, gently pat wound dry prior to applying new dressing. Wound #2 Right,Medial Malleolus: Clean wound with Normal Saline. Cleanse wound with mild soap and water May Shower, gently pat wound dry prior to applying new dressing. RAMIL, EDGINGTON ([id number]) Anesthetic: Wound #1 Right,Lateral Malleolus: Topical Lidocaine 4% cream applied to wound bed prior to debridement Wound #2 Right,Medial Malleolus: Topical Lidocaine 4% cream applied to wound bed prior to debridement Skin Barriers/Peri-Wound Care: Wound #1 Right,Lateral Malleolus: Barrier cream Wound #2 Right,Medial Malleolus: Barrier cream Primary Wound  Dressing: Wound #1 Right,Lateral Malleolus: Santyl Ointment Wound #2 Right,Medial Malleolus: Santyl Ointment Secondary Dressing: Wound #1 Right,Lateral Malleolus: ABD pad Conform/Kerlix Drawtex Wound #2 Right,Medial Malleolus: ABD pad Conform/Kerlix Drawtex Dressing Change Frequency: Wound #1 Right,Lateral Malleolus: Change dressing every day. Wound #2 Right,Medial Malleolus: Change dressing every day. Follow-up Appointments: Wound #1 Right,Lateral Malleolus: Return Appointment in 2 weeks. Wound #2 Right,Medial Malleolus: Return Appointment in 2 weeks. Edema Control: Wound #1 Right,Lateral Malleolus: Elevate legs to the level of the heart and pump ankles as often as possible Wound #2 Right,Medial Malleolus: Elevate legs to the level of the heart and pump ankles as often as possible Additional Orders / Instructions: Wound #1 Right,Lateral Malleolus: Increase protein intake. Wound #2 Right,Medial Malleolus: Increase protein intake. Medications-please add to medication list.: Wound #1 Right,Lateral Malleolus: Santyl Enzymatic Ointment Other: - Vitamin C, Zinc, MVI Wound #2 Right,Medial Malleolus: Santyl Enzymatic Ointment Other: - Vitamin C, Zinc, MVI The following medication(s) was prescribed: Santyl topical 250 unit/gram ointment ointment topical to wound with dressing change starting 07/16/2017 KEYSHAUN, EXLEY ([id number]) #1 as the patient's drainage is down to minimal this should allow treatment with either Santyl or medihoney #2 wounds have been extremely refractory to other methods to control necrotic surface debris including Iodoflex, Hydrofera Blue, sorbact. #3 is not a candidate for plastic surgery #4 I have not been able to get this wound bed down to a surface that would support a skin substitute and early in the year he had a co-pay that was not affordable. Electronic Signature(s) Signed: 07/17/2017 8:07:00 AM By: [name] MD Entered By: [name] on 07/16/2017 08:33:23 [name] ([id number]) -------------------------------------------------------------------------------- SuperBill Details Patient Name: [name] Date of Service: 07/16/2017 Medical Record Number: [id number] Patient Account Number: [id number] Date of Birth/Sex: 1946-03-26 (71 y.o. 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, initial encounter T81.31XD Disruption of external operation (surgical) wound, not elsewhere classified, subsequent encounter L97.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 of skin Facility Procedures CPT4: Description Modifier Quantity Code [id number] 11042 - DEB SUBQ TISSUE 20 SQ CM/< 1 ICD-10 Diagnosis Description I87.331 Chronic venous hypertension (idiopathic) with ulcer and inflammation of right lower extremity CPT4: 95621308 11045 - DEB SUBQ TISS EA ADDL 20CM 1 ICD-10 Diagnosis Description I87.331 Chronic venous hypertension (idiopathic) with ulcer and inflammation of right lower extremity Physician Procedures CPT4: Description Modifier Quantity Code [id number] 11042 - WC PHYS SUBQ TISS 20 SQ CM 1 ICD-10 Diagnosis Description I87.331 Chronic venous hypertension (idiopathic) with ulcer and inflammation of right lower extremity CPT4: 6295284 11045 - WC PHYS SUBQ TISS EA ADDL 20 CM 1 ICD-10 Diagnosis Description I87.331 Chronic venous hypertension (idiopathic) with ulcer and  inflammation of right lower extremity Electronic Signature(s) Signed: 07/17/2017 8:07:00 AM By: [name] MD Entered By: [name] on 07/16/2017 08:33:49 [name] ([id number])

## 2017-07-24 NOTE — Progress Notes (Signed)
[name], Kavion C. ([id number]) Visit Report for 07/16/2017 Arrival Information Details Patient Name: [name], Isrrael C. Date of Service: 07/16/2017 8:00 AM Medical Record Number: [id number] Patient Account Number: [id number] Date of Birth/Sex: [birth year](71 y.o. Male) Treating RN: Ashok CordiaPinkerton, [name] Primary Care Annora Guderian: Darreld McleanMILES, LINDA Other Clinician: Referring Elverna Caffee: Darreld McleanMILES, LINDA Treating Merilee Wible/Extender: Altamese CarolinaOBSON, [name] G Weeks in Treatment: [id number] Visit Information History Since Last Visit All ordered tests and consults were completed: No Patient Arrived: Ambulatory Added or deleted any medications: No Arrival Time: 08:02 Any new allergies or adverse reactions: No Accompanied By: self Had a fall or experienced change in No Transfer Assistance: None activities of daily living that may affect Patient Identification Verified: Yes risk of falls: Secondary Verification Process Yes Signs or symptoms of abuse/neglect since last visito No Completed: Hospitalized since last visit: No Patient Requires Transmission-Based No Has Dressing in Place as Prescribed: Yes Precautions: Has Compression in Place as Prescribed: Yes Patient Has Alerts: Yes Pain Present Now: No Patient Alerts: Patient on Blood Thinner Electronic Signature(s) Signed: 07/16/2017 4:11:07 PM By: Alejandro MullingPinkerton, Debra Entered By: Alejandro MullingPinkerton, Debra on 07/16/2017 08:04:53 [name], Kort C. ([id number]) -------------------------------------------------------------------------------- Encounter Discharge Information Details Patient Name: [name], Pacer C. Date of Service: 07/16/2017 8:00 AM Medical Record Number: [id number] Patient Account Number: [id number] Date of Birth/Sex: [birth year](71 y.o. Male) Treating RN: Phillis HaggisPinkerton, [name] Primary Care Clearance Chenault: Darreld McleanMILES, LINDA Other Clinician: Referring Gianfranco Araki: Darreld McleanMILES, LINDA Treating Bailynn Dyk/Extender: Maxwell CaulOBSON, [name] G Weeks in Treatment: [id number] Encounter Discharge Information  Items Discharge Pain Level: 0 Discharge Condition: Stable Ambulatory Status: Ambulatory Discharge Destination: Home Transportation: Private Auto Accompanied By: self Schedule Follow-up Appointment: Yes Medication Reconciliation completed and No provided to Patient/Care Teneisha Gignac: Patient Clinical Summary of Care: Declined Electronic Signature(s) Signed: 07/24/2017 9:06:40 AM By: Alejandro MullingPinkerton, Debra Entered By: Alejandro MullingPinkerton, Debra on 07/24/2017 09:06:40 [name], Bora C. ([id number]) -------------------------------------------------------------------------------- Lower Extremity Assessment Details Patient Name: [name], Brek C. Date of Service: 07/16/2017 8:00 AM Medical Record Number: [id number] Patient Account Number: [id number] Date of Birth/Sex: [birth year](71 y.o. Male) Treating RN: Phillis HaggisPinkerton, [name] Primary Care Tyrrell Stephens: Darreld McleanMILES, LINDA Other Clinician: Referring Prerna Harold: Darreld McleanMILES, LINDA Treating Jamear Carbonneau/Extender: Maxwell CaulOBSON, [name] G Weeks in Treatment: 78 Vascular Assessment Pulses: Dorsalis Pedis Palpable: [Right:Yes] Posterior Tibial Extremity colors, hair growth, and conditions: Extremity Color: [Right:Normal] Temperature of Extremity: [Right:Warm] Capillary Refill: [Right:< 3 seconds] Toe Nail Assessment Left: Right: Thick: Yes Discolored: Yes Deformed: No Improper Length and Hygiene: Yes Electronic Signature(s) Signed: 07/16/2017 4:11:07 PM By: Alejandro MullingPinkerton, Debra Entered By: Alejandro MullingPinkerton, Debra on 07/16/2017 08:15:32 [name], Aasim C. ([id number]) -------------------------------------------------------------------------------- Multi Wound Chart Details Patient Name: [name], Aashrith C. Date of Service: 07/16/2017 8:00 AM Medical Record Number: [id number] Patient Account Number: [id number] Date of Birth/Sex: [birth year] [age] y.o. Male) Treating RN: Ashok CordiaPinkerton, [name] Primary Care Dawn Convery: Darreld McleanMILES, LINDA Other Clinician: Referring Timia Casselman: Darreld McleanMILES, LINDA Treating  Lovett Coffin/Extender: Maxwell CaulOBSON, [name] G Weeks in Treatment: 78 Vital Signs Height(in): 76 Pulse(bpm): 92 Weight(lbs): 238 Blood Pressure(mmHg): 141/70 Body Mass Index(BMI): 29 Temperature(F): 98.4 Respiratory Rate 18 (breaths/min): Photos: [1:No Photos] [2:No Photos] [N/A:N/A] Wound Location: [1:Right Malleolus - Lateral] [2:Right Malleolus - Medial] [N/A:N/A] Wounding Event: [1:Gradually Appeared] [2:Gradually Appeared] [N/A:N/A] Primary Etiology: [1:Venous Leg Ulcer] [2:Venous Leg Ulcer] [N/A:N/A] Comorbid History: [1:Cataracts, Chronic Obstructive Cataracts, Chronic Obstructive N/A Pulmonary Disease (COPD), Pulmonary Disease (COPD), Osteoarthritis] [2:Osteoarthritis] Date Acquired: [1:08/11/2015] [2:01/30/2016] [N/A:N/A] Weeks of Treatment: [1:78] [2:76] [N/A:N/A] Wound Status: [1:Open] [2:Open] [N/A:N/A] Clustered Wound: [1:No] [2:Yes] [N/A:N/A] Measurements L x W x D [1:4.5x3x0.2] [2:3.5x3.1x0.1] [N/A:N/A] (cm) Area (cm) : [1:10.603] [2:8.522] [N/A:N/A] Volume (cm) : [1:2.121] [2:0.852] [N/A:N/A] %  Reduction in Area: [1:38.60%] [2:26.20%] [N/A:N/A] % Reduction in Volume: [1:59.10%] [2:26.20%] [N/A:N/A] Classification: [1:Full Thickness Without Exposed Support Structures] [2:Full Thickness Without Exposed Support Structures] [N/A:N/A] Exudate Amount: [1:Large] [2:Large] [N/A:N/A] Exudate Type: [1:Serous] [2:Serous] [N/A:N/A] Exudate Color: [1:amber] [2:amber] [N/A:N/A] Wound Margin: [1:Distinct, outline attached] [2:Distinct, outline attached] [N/A:N/A] Granulation Amount: [1:Small (1-33%)] [2:Small (1-33%)] [N/A:N/A] Granulation Quality: [1:Red] [2:Red] [N/A:N/A] Necrotic Amount: [1:Large (67-100%)] [2:Large (67-100%)] [N/A:N/A] Exposed Structures: [1:Fat Layer (Subcutaneous Tissue) Exposed: Yes Fascia: No Tendon: No Muscle: No Joint: No Bone: No] [2:Fat Layer (Subcutaneous Tissue) Exposed: Yes Fascia: No Tendon: No Muscle: No Joint: No Bone: No]  [N/A:N/A] Epithelialization: [1:Small (1-33%)] [2:None] [N/A:N/A] Debridement: [1:Debridement (11042-11047)] [2:Debridement (11042-11047)] [N/A:N/A] Pre-procedure [1:08:20] [2:08:20] [N/A:N/A] Verification/Time Out Taken: [place], PROSCH ([id number]) Pain Control: Lidocaine 4% Topical Solution Lidocaine 4% Topical Solution N/A Tissue Debrided: Fibrin/Slough, Exudates, Fibrin/Slough, Exudates, N/A Subcutaneous Subcutaneous Level: Skin/Subcutaneous Tissue Skin/Subcutaneous Tissue N/A Debridement Area (sq cm): 13.5 10.85 N/A Instrument: Other(scoop) Other(scoop) N/A Bleeding: Minimum Minimum N/A Hemostasis Achieved: Pressure Pressure N/A Procedural Pain: 0 0 N/A Post Procedural Pain: 0 0 N/A Debridement Treatment Procedure was tolerated well Procedure was tolerated well N/A Response: Post Debridement 4.5x3x0.3 3.5x3.1x0.2 N/A Measurements L x W x D (cm) Post Debridement Volume: 3.181 1.704 N/A (cm) Periwound Skin Texture: Scarring: Yes Scarring: Yes N/A Excoriation: No Excoriation: No Induration: No Induration: No Callus: No Callus: No Crepitus: No Crepitus: No Rash: No Rash: No Periwound Skin Moisture: Maceration: Yes Maceration: Yes N/A Dry/Scaly: No Dry/Scaly: No Periwound Skin Color: Hemosiderin Staining: Yes Hemosiderin Staining: Yes N/A Atrophie Blanche: No Atrophie Blanche: No Cyanosis: No Cyanosis: No Ecchymosis: No Ecchymosis: No Erythema: No Erythema: No Mottled: No Mottled: No Pallor: No Pallor: No Rubor: No Rubor: No Temperature: No Abnormality No Abnormality N/A Tenderness on Palpation: Yes Yes N/A Wound Preparation: Ulcer Cleansing: Ulcer Cleansing: N/A Rinsed/Irrigated with Saline Rinsed/Irrigated with Saline Topical Anesthetic Applied: Topical Anesthetic Applied: Other: lidocaine 4% Other: lidocaine 4% Procedures Performed: Debridement Debridement N/A Treatment Notes Electronic Signature(s) Signed: 07/17/2017 8:07:00 AM By:  [name] MD Entered By: [name] on 07/16/2017 08:26:08 [name] Stack ([id number]) -------------------------------------------------------------------------------- Multi-Disciplinary Care Plan Details Patient Name: [name] Stack Date of Service: 07/16/2017 8:00 AM Medical Record Number: [id number] Patient Account Number: [id number] Date of Birth/Sex: Dec 21, 1945 (71 y.o. Male) Treating RN: [name] Primary Care Sourish Allender: [name] Other Clinician: Referring Ahmad Vanwey: [name] Treating Kashena Novitski/Extender: [name] Weeks in Treatment: 78 Active Inactive ` Venous Leg Ulcer Nursing Diagnoses: Knowledge deficit related to disease process and management Goals: Patient will maintain optimal edema control Date Initiated: 04/03/2016 Target Resolution Date: 11/24/2016 Goal Status: Active Interventions: Compression as ordered Treatment Activities: Therapeutic compression applied : 04/03/2016 Notes: ` Wound/Skin Impairment Nursing Diagnoses: Impaired tissue integrity Goals: Ulcer/skin breakdown will heal within 14 weeks Date Initiated: 01/17/2016 Target Resolution Date: 11/24/2016 Goal Status: Active Interventions: Assess ulceration(s) every visit Notes: Electronic Signature(s) Signed: 07/16/2017 4:11:07 PM By: Alejandro Mulling Entered By: Alejandro Mulling on 07/16/2017 08:15:55 [name] Stack ([id number]) -------------------------------------------------------------------------------- Pain Assessment Details Patient Name: [name] Stack Date of Service: 07/16/2017 8:00 AM Medical Record Number: [id number] Patient Account Number: [id number] Date of Birth/Sex: 13-Dec-1945 (71 y.o. Male) Treating RN: [name] Primary Care Thursa Emme: [name] Other Clinician: Referring Dawayne Ohair: [name] Treating Stephine Langbehn/Extender: [name] Weeks in Treatment: 78 Active Problems Location of Pain Severity and Description of  Pain Patient Has Paino No Site Locations Pain Management and Medication Current Pain Management: Electronic Signature(s) Signed: 07/16/2017 4:11:07 PM By: Ashok Cordia,  Debra Entered By: Alejandro Mulling on 07/16/2017 08:04:59 [name] Stack ([id number]) -------------------------------------------------------------------------------- Patient/Caregiver Education Details Patient Name: [name] Stack Date of Service: 07/16/2017 8:00 AM Medical Record Number: [id number] Patient Account Number: [id number] Date of Birth/Gender: 01/12/1946 (71 y.o. Male) Treating RN: [name] Primary Care Physician: [name] Other Clinician: Referring Physician: [name] Treating Physician/Extender: Altamese [place] in Treatment: 65 Education Assessment Education Provided To: Patient Education Topics Provided Wound/Skin Impairment: Handouts: Other: change dressing as ordered Methods: Demonstration, Explain/Verbal Responses: State content correctly Electronic Signature(s) Signed: 07/16/2017 4:11:07 PM By: Alejandro Mulling Entered By: Alejandro Mulling on 07/16/2017 08:19:07 [name] Stack ([id number]) -------------------------------------------------------------------------------- Wound Assessment Details Patient Name: [name] Stack Date of Service: 07/16/2017 8:00 AM Medical Record Number: [id number] Patient Account Number: [id number] Date of Birth/Sex: [birth year] (71 y.o. Male) Treating RN: Ashok Cordia, [name] Primary Care [name] Lofton: [name] Other Clinician: Referring Caeleigh Prohaska: [name] Treating Carlen Rebuck/Extender: [name] Weeks in Treatment: 78 Wound Status Wound Number: 1 Primary Venous Leg Ulcer Etiology: Wound Location: Right Malleolus - Lateral Wound Open Wounding Event: Gradually Appeared Status: Date Acquired: 08/11/2015 Comorbid Cataracts, Chronic Obstructive Pulmonary Weeks Of Treatment: 78 History: Disease (COPD),  Osteoarthritis Clustered Wound: No Photos Photo Uploaded By: Alejandro Mulling on 07/16/2017 16:02:47 Wound Measurements Length: (cm) 4.5 Width: (cm) 3 Depth: (cm) 0.2 Area: (cm) 10.603 Volume: (cm) 2.121 % Reduction in Area: 38.6% % Reduction in Volume: 59.1% Epithelialization: Small (1-33%) Tunneling: No Undermining: No Wound Description Full Thickness Without Exposed Support Classification: Structures Wound Margin: Distinct, outline attached Exudate Large Amount: Exudate Type: Serous Exudate Color: amber Foul Odor After Cleansing: No Slough/Fibrino Yes Wound Bed Granulation Amount: Small (1-33%) Exposed Structure Granulation Quality: Red Fascia Exposed: No Necrotic Amount: Large (67-100%) Fat Layer (Subcutaneous Tissue) Exposed: Yes Necrotic Quality: Adherent Slough Tendon Exposed: No Muscle Exposed: No Joint Exposed: No Bone Exposed: No Dupuis, Koven C. ([id number]) Periwound Skin Texture Texture Color No Abnormalities Noted: No No Abnormalities Noted: No Callus: No Atrophie Blanche: No Crepitus: No Cyanosis: No Excoriation: No Ecchymosis: No Induration: No Erythema: No Rash: No Hemosiderin Staining: Yes Scarring: Yes Mottled: No Pallor: No Moisture Rubor: No No Abnormalities Noted: No Dry / Scaly: No Temperature / Pain Maceration: Yes Temperature: No Abnormality Tenderness on Palpation: Yes Wound Preparation Ulcer Cleansing: Rinsed/Irrigated with Saline Topical Anesthetic Applied: Other: lidocaine 4%, Treatment Notes Wound #1 (Right, Lateral Malleolus) 1. Cleansed with: Clean wound with Normal Saline 2. Anesthetic Topical Lidocaine 4% cream to wound bed prior to debridement 4. Dressing Applied: Santyl Ointment 5. Secondary Dressing Applied ABD Pad Dry Gauze Kerlix/Conform 7. Secured with Paper tape Notes drawtex Electronic Signature(s) Signed: 07/16/2017 4:11:07 PM By: Alejandro Mulling Entered By: Alejandro Mulling on  07/16/2017 08:12:47 [name] Stack ([id number]) -------------------------------------------------------------------------------- Wound Assessment Details Patient Name: [name] Stack Date of Service: 07/16/2017 8:00 AM Medical Record Number: [id number] Patient Account Number: [id number] Date of Birth/Sex: 07/14/1946 (71 y.o. Male) Treating RN: Ashok Cordia, [name] Primary Care Anthonio Mizzell: [name] Other Clinician: Referring Kelly Ranieri: [name] Treating Lorilee Cafarella/Extender: [name] Weeks in Treatment: 78 Wound Status Wound Number: 2 Primary Venous Leg Ulcer Etiology: Wound Location: Right Malleolus - Medial Wound Open Wounding Event: Gradually Appeared Status: Date Acquired: 01/30/2016 Comorbid Cataracts, Chronic Obstructive Pulmonary Weeks Of Treatment: 76 History: Disease (COPD), Osteoarthritis Clustered Wound: Yes Photos Photo Uploaded By: Alejandro Mulling on 07/16/2017 16:02:47 Wound Measurements Length: (cm) 3.5 Width: (cm) 3.1 Depth: (cm) 0.1 Area: (cm) 8.522 Volume: (cm) 0.852 % Reduction in Area: 26.2% % Reduction in  Volume: 26.2% Epithelialization: None Tunneling: No Undermining: No Wound Description Full Thickness Without Exposed Support Classification: Structures Wound Margin: Distinct, outline attached Exudate Large Amount: Exudate Type: Serous Exudate Color: amber Foul Odor After Cleansing: No Slough/Fibrino Yes Wound Bed Granulation Amount: Small (1-33%) Exposed Structure Granulation Quality: Red Fascia Exposed: No Necrotic Amount: Large (67-100%) Fat Layer (Subcutaneous Tissue) Exposed: Yes Necrotic Quality: Adherent Slough Tendon Exposed: No Muscle Exposed: No Joint Exposed: No Bone Exposed: No Coltrin, Daymein C. ([id number]) Periwound Skin Texture Texture Color No Abnormalities Noted: No No Abnormalities Noted: No Callus: No Atrophie Blanche: No Crepitus: No Cyanosis: No Excoriation: No Ecchymosis:  No Induration: No Erythema: No Rash: No Hemosiderin Staining: Yes Scarring: Yes Mottled: No Pallor: No Moisture Rubor: No No Abnormalities Noted: No Dry / Scaly: No Temperature / Pain Maceration: Yes Temperature: No Abnormality Tenderness on Palpation: Yes Wound Preparation Ulcer Cleansing: Rinsed/Irrigated with Saline Topical Anesthetic Applied: Other: lidocaine 4%, Treatment Notes Wound #2 (Right, Medial Malleolus) 1. Cleansed with: Clean wound with Normal Saline 2. Anesthetic Topical Lidocaine 4% cream to wound bed prior to debridement 4. Dressing Applied: Santyl Ointment 5. Secondary Dressing Applied ABD Pad Dry Gauze Kerlix/Conform 7. Secured with Paper tape Notes drawtex Electronic Signature(s) Signed: 07/16/2017 4:11:07 PM By: Alejandro MullingPinkerton, Debra Entered By: Alejandro MullingPinkerton, Debra on 07/16/2017 08:13:27 [name], Dameir C. ([id number]) -------------------------------------------------------------------------------- Vitals Details Patient Name: [name], Braxston C. Date of Service: 07/16/2017 8:00 AM Medical Record Number: [id number] Patient Account Number: [id number] Date of Birth/Sex: 1945-09-10 [age] y.o. Male) Treating RN: Ashok CordiaPinkerton, [name] Primary Care Demarquis Osley: Darreld McleanMILES, LINDA Other Clinician: Referring Rayshun Kandler: Darreld McleanMILES, LINDA Treating Madeline Bebout/Extender: Maxwell CaulOBSON, [name] G Weeks in Treatment: 78 Vital Signs Time Taken: 08:05 Temperature (F): 98.4 Height (in): 76 Pulse (bpm): 92 Weight (lbs): 238 Respiratory Rate (breaths/min): 18 Body Mass Index (BMI): 29 Blood Pressure (mmHg): 141/70 Reference Range: 80 - 120 mg / dl Electronic Signature(s) Signed: 07/16/2017 4:11:07 PM By: Alejandro MullingPinkerton, Debra Entered By: Alejandro MullingPinkerton, Debra on 07/16/2017 08:05:22

## 2017-07-30 ENCOUNTER — Encounter: Payer: Medicare HMO | Attending: Physician Assistant | Admitting: Physician Assistant

## 2017-07-30 DIAGNOSIS — L97212 Non-pressure chronic ulcer of right calf with fat layer exposed: Secondary | ICD-10-CM | POA: Insufficient documentation

## 2017-07-30 DIAGNOSIS — Z86718 Personal history of other venous thrombosis and embolism: Secondary | ICD-10-CM | POA: Insufficient documentation

## 2017-07-30 DIAGNOSIS — I87331 Chronic venous hypertension (idiopathic) with ulcer and inflammation of right lower extremity: Secondary | ICD-10-CM | POA: Diagnosis present

## 2017-07-30 DIAGNOSIS — T85613A 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, initial encounter: Secondary | ICD-10-CM | POA: Diagnosis not present

## 2017-07-30 DIAGNOSIS — Z87891 Personal history of nicotine dependence: Secondary | ICD-10-CM | POA: Insufficient documentation

## 2017-07-30 DIAGNOSIS — J449 Chronic obstructive pulmonary disease, unspecified: Secondary | ICD-10-CM | POA: Diagnosis not present

## 2017-07-30 DIAGNOSIS — L97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 of skin: Secondary | ICD-10-CM | POA: Diagnosis not present

## 2017-07-30 DIAGNOSIS — T8131XD Disruption of external operation (surgical) wound, not elsewhere classified, subsequent encounter: Secondary | ICD-10-CM | POA: Insufficient documentation

## 2017-07-30 DIAGNOSIS — Z7901 Long term (current) use of anticoagulants: Secondary | ICD-10-CM | POA: Diagnosis not present

## 2017-07-30 DIAGNOSIS — Y839 Surgical procedure, unspecified as the cause of abnormal reaction of the patient, or of later complication, without mention of misadventure at the time of the procedure: Secondary | ICD-10-CM | POA: Diagnosis not present

## 2017-07-30 DIAGNOSIS — M1990 Unspecified osteoarthritis, unspecified site: Secondary | ICD-10-CM | POA: Diagnosis not present

## 2017-08-01 NOTE — Progress Notes (Signed)
[name], [name] ([id number]) Visit Report for 07/30/2017 Arrival Information Details Patient Name: [name], [name]. Date of Service: 07/30/2017 8:00 AM Medical Record Number: [id number] Patient Account Number: [id number] Date of Birth/Sex: July 05, 1946 (71 y.o. Male) Treating RN: Ashok Cordia, [name] Primary Care Harsimran Westman: [name] Other Clinician: Referring Jyren Cerasoli: [name] Treating Sylvie Mifsud/Extender: Altamese  in Treatment: 80 Visit Information History Since Last Visit All ordered tests and consults were completed: No Patient Arrived: Ambulatory Added or deleted any medications: No Arrival Time: 08:04 Any new allergies or adverse reactions: No Accompanied By: self Had a fall or experienced change in No Transfer Assistance: None activities of daily living that may affect Patient Identification Verified: Yes risk of falls: Secondary Verification Process Yes Signs or symptoms of abuse/neglect since last visito No Completed: Hospitalized since last visit: No Patient Requires Transmission-Based No Has Dressing in Place as Prescribed: Yes Precautions: Pain Present Now: No Patient Has Alerts: Yes Patient Alerts: Patient on Blood Thinner Electronic Signature(s) Signed: 07/31/2017 5:20:26 PM By: Alejandro Mulling Entered By: Alejandro Mulling on 07/30/2017 08:04:54 [name] ([id number]) -------------------------------------------------------------------------------- Encounter Discharge Information Details Patient Name: [name] Date of Service: 07/30/2017 8:00 AM Medical Record Number: [id number] Patient Account Number: [id number] Date of Birth/Sex: 02-Aug-1946 (71 y.o. Male) Treating RN: [name] Primary Care Contrina Orona: [name] Other Clinician: Referring Adhrit Krenz: [name] Treating Jassmin Kemmerer/Extender: STONE III, HOYT Weeks in Treatment: 80 Encounter Discharge Information Items Discharge Pain Level: 0 Discharge Condition:  Stable Ambulatory Status: Ambulatory Discharge Destination: Home Transportation: Private Auto Accompanied By: self Schedule Follow-up Appointment: Yes Medication Reconciliation completed and No provided to Patient/Care Aleyssa Pike: Patient Clinical Summary of Care: Declined Electronic Signature(s) Signed: 07/30/2017 4:48:37 PM By: Gwenlyn Perking Entered By: Gwenlyn Perking on 07/30/2017 08:40:37 [name] ([id number]) -------------------------------------------------------------------------------- Lower Extremity Assessment Details Patient Name: [name] Date of Service: 07/30/2017 8:00 AM Medical Record Number: [id number] Patient Account Number: [id number] Date of Birth/Sex: 30-Nov-1945 (71 y.o. Male) Treating RN: [name] Primary Care Kanasia Gayman: [name] Other Clinician: Referring Lamonica Trueba: [name] Treating Denzel Etienne/Extender: STONE III, HOYT Weeks in Treatment: 80 Vascular Assessment Pulses: Dorsalis Pedis Palpable: [Right:Yes] Posterior Tibial Extremity colors, hair growth, and conditions: Extremity Color: [Right:Normal] Temperature of Extremity: [Right:Warm] Capillary Refill: [Right:< 3 seconds] Toe Nail Assessment Left: Right: Thick: Yes Discolored: Yes Deformed: No Improper Length and Hygiene: Yes Electronic Signature(s) Signed: 07/31/2017 5:20:26 PM By: Alejandro Mulling Entered By: Alejandro Mulling on 07/30/2017 08:12:19 [name] ([id number]) -------------------------------------------------------------------------------- Multi Wound Chart Details Patient Name: [name] Date of Service: 07/30/2017 8:00 AM Medical Record Number: [id number] Patient Account Number: [id number] Date of Birth/Sex: Feb 05, 1946 (71 y.o. Male) Treating RN: Ashok Cordia, [name] Primary Care Ruthy Forry: [name] Other Clinician: Referring Lemoyne Nestor: [name] Treating Boss Danielsen/Extender: STONE III, HOYT Weeks in Treatment: 80 Vital  Signs Height(in): 76 Pulse(bpm): 73 Weight(lbs): 238 Blood Pressure(mmHg): 138/80 Body Mass Index(BMI): 29 Temperature(F): 98.0 Respiratory Rate 18 (breaths/min): Photos: [1:No Photos] [2:No Photos] [N/A:N/A] Wound Location: [1:Right Malleolus - Lateral] [2:Right Malleolus - Medial] [N/A:N/A] Wounding Event: [1:Gradually Appeared] [2:Gradually Appeared] [N/A:N/A] Primary Etiology: [1:Venous Leg Ulcer] [2:Venous Leg Ulcer] [N/A:N/A] Comorbid History: [1:Cataracts, Chronic Obstructive Cataracts, Chronic Obstructive N/A Pulmonary Disease (COPD), Pulmonary Disease (COPD), Osteoarthritis] [2:Osteoarthritis] Date Acquired: [1:08/11/2015] [2:01/30/2016] [N/A:N/A] Weeks of Treatment: [1:80] [2:78] [N/A:N/A] Wound Status: [1:Open] [2:Open] [N/A:N/A] Clustered Wound: [1:No] [2:Yes] [N/A:N/A] Measurements L x W x D [1:4.9x2.6x0.2] [2:3.5x3.2x0.1] [N/A:N/A] (cm) Area (cm) : [1:10.006] [2:8.796] [N/A:N/A] Volume (cm) : [1:2.001] [2:0.88] [N/A:N/A] % Reduction in Area: [1:42.10%] [2:23.80%] [N/A:N/A] %  Reduction in Volume: [1:61.40%] [2:23.80%] [N/A:N/A] Classification: [1:Full Thickness Without Exposed Support Structures] [2:Full Thickness Without Exposed Support Structures] [N/A:N/A] Exudate Amount: [1:Large] [2:Large] [N/A:N/A] Exudate Type: [1:Serous] [2:Serous] [N/A:N/A] Exudate Color: [1:amber] [2:amber] [N/A:N/A] Wound Margin: [1:Distinct, outline attached] [2:Distinct, outline attached] [N/A:N/A] Granulation Amount: [1:Small (1-33%)] [2:Small (1-33%)] [N/A:N/A] Granulation Quality: [1:Red] [2:Red] [N/A:N/A] Necrotic Amount: [1:Large (67-100%)] [2:Large (67-100%)] [N/A:N/A] Exposed Structures: [1:Fat Layer (Subcutaneous Tissue) Exposed: Yes Fascia: No Tendon: No Muscle: No Joint: No Bone: No] [2:Fat Layer (Subcutaneous Tissue) Exposed: Yes Fascia: No Tendon: No Muscle: No Joint: No Bone: No] [N/A:N/A] Epithelialization: [1:Small (1-33%)] [2:None] [N/A:N/A] Periwound Skin  Texture: [1:Scarring: Yes Excoriation: No Induration: No] [2:Scarring: Yes Excoriation: No Induration: No] [N/A:N/A] Callus: No Callus: No Crepitus: No Crepitus: No Rash: No Rash: No Periwound Skin Moisture: Maceration: Yes Maceration: Yes N/A Dry/Scaly: No Dry/Scaly: No Periwound Skin Color: Hemosiderin Staining: Yes Hemosiderin Staining: Yes N/A Atrophie Blanche: No Atrophie Blanche: No Cyanosis: No Cyanosis: No Ecchymosis: No Ecchymosis: No Erythema: No Erythema: No Mottled: No Mottled: No Pallor: No Pallor: No Rubor: No Rubor: No Temperature: No Abnormality No Abnormality N/A Tenderness on Palpation: Yes Yes N/A Wound Preparation: Ulcer Cleansing: Ulcer Cleansing: N/A Rinsed/Irrigated with Saline Rinsed/Irrigated with Saline Topical Anesthetic Applied: Topical Anesthetic Applied: Other: lidocaine 4% Other: lidocaine 4% Treatment Notes Electronic Signature(s) Signed: 07/31/2017 5:20:26 PM By: Alejandro MullingPinkerton, Debra Entered By: Alejandro MullingPinkerton, Debra on 07/30/2017 08:14:41 [name] StackBLACKWELL, [name] ([id number]) -------------------------------------------------------------------------------- Multi-Disciplinary Care Plan Details Patient Name: [name] StackBLACKWELL, [name] Date of Service: 07/30/2017 8:00 AM Medical Record Number: [id number] Patient Account Number: [id number] Date of Birth/Sex: 22-Jun-1946 34(71 y.o. Male) Treating RN: Phillis HaggisPinkerton, [name] Primary Care Areanna Gengler: Darreld McleanMILES, LINDA Other Clinician: Referring Isobella Ascher: Darreld McleanMILES, LINDA Treating Talley Casco/Extender: STONE III, HOYT Weeks in Treatment: 80 Active Inactive ` Venous Leg Ulcer Nursing Diagnoses: Knowledge deficit related to disease process and management Goals: Patient will maintain optimal edema control Date Initiated: 04/03/2016 Target Resolution Date: 11/24/2016 Goal Status: Active Interventions: Compression as ordered Treatment Activities: Therapeutic compression applied : 04/03/2016 Notes: ` Wound/Skin Impairment Nursing  Diagnoses: Impaired tissue integrity Goals: Ulcer/skin breakdown will heal within 14 weeks Date Initiated: 01/17/2016 Target Resolution Date: 11/24/2016 Goal Status: Active Interventions: Assess ulceration(s) every visit Notes: Electronic Signature(s) Signed: 07/31/2017 5:20:26 PM By: Alejandro MullingPinkerton, Debra Entered By: Alejandro MullingPinkerton, Debra on 07/30/2017 08:13:33 [name] StackBLACKWELL, [name] ([id number]) -------------------------------------------------------------------------------- Pain Assessment Details Patient Name: [name] StackBLACKWELL, [name] Date of Service: 07/30/2017 8:00 AM Medical Record Number: [id number] Patient Account Number: [id number] Date of Birth/Sex: 22-Jun-1946 4(71 y.o. Male) Treating RN: Phillis HaggisPinkerton, [name] Primary Care Delontae Lamm: Darreld McleanMILES, LINDA Other Clinician: Referring Jacqueline Delapena: Darreld McleanMILES, LINDA Treating Gayna Braddy/Extender: Maxwell CaulOBSON, [name] G Weeks in Treatment: 80 Active Problems Location of Pain Severity and Description of Pain Patient Has Paino No Site Locations Pain Management and Medication Current Pain Management: Electronic Signature(s) Signed: 07/31/2017 5:20:26 PM By: Alejandro MullingPinkerton, Debra Entered By: Alejandro MullingPinkerton, Debra on 07/30/2017 08:05:00 [name] StackBLACKWELL, [name] ([id number]) -------------------------------------------------------------------------------- Patient/Caregiver Education Details Patient Name: [name] StackBLACKWELL, [name] Date of Service: 07/30/2017 8:00 AM Medical Record Number: [id number] Patient Account Number: [id number] Date of Birth/Gender: 22-Jun-1946 [age] y.o. Male) Treating RN: Phillis HaggisPinkerton, [name] Primary Care Physician: Darreld McleanMILES, LINDA Other Clinician: Referring Physician: Darreld McleanMILES, LINDA Treating Physician/Extender: Linwood DibblesSTONE III, HOYT Weeks in Treatment: 480 Education Assessment Education Provided To: Patient Education Topics Provided Wound/Skin Impairment: Handouts: Other: change dressing as ordered Methods: Demonstration, Explain/Verbal Responses: State content correctly Electronic  Signature(s) Signed: 07/31/2017 5:20:26 PM By: Alejandro MullingPinkerton, Debra Entered By: Alejandro MullingPinkerton, Debra on 07/30/2017 08:16:53 [name] StackBLACKWELL, [name] ([id number]) -------------------------------------------------------------------------------- Wound Assessment Details Patient Name: [name], [name]  C. Date of Service: 07/30/2017 8:00 AM Medical Record Number: [id number] Patient Account Number: [id number] Date of Birth/Sex: 08-20-1946 (71 y.o. Male) Treating RN: Ashok Cordia, [name] Primary Care Kerria Sapien: [name] Other Clinician: Referring Kayla Deshaies: [name] Treating Lennard Capek/Extender: STONE III, HOYT Weeks in Treatment: 80 Wound Status Wound Number: 1 Primary Venous Leg Ulcer Etiology: Wound Location: Right Malleolus - Lateral Wound Open Wounding Event: Gradually Appeared Status: Date Acquired: 08/11/2015 Comorbid Cataracts, Chronic Obstructive Pulmonary Weeks Of Treatment: 80 History: Disease (COPD), Osteoarthritis Clustered Wound: No Photos Photo Uploaded By: Alejandro Mulling on 07/30/2017 14:30:49 Wound Measurements Length: (cm) 4.9 Width: (cm) 2.6 Depth: (cm) 0.2 Area: (cm) 10.006 Volume: (cm) 2.001 % Reduction in Area: 42.1% % Reduction in Volume: 61.4% Epithelialization: Small (1-33%) Tunneling: No Undermining: No Wound Description Full Thickness Without Exposed Support Classification: Structures Wound Margin: Distinct, outline attached Exudate Large Amount: Exudate Type: Serous Exudate Color: amber Foul Odor After Cleansing: No Slough/Fibrino Yes Wound Bed Granulation Amount: Small (1-33%) Exposed Structure Granulation Quality: Red Fascia Exposed: No Necrotic Amount: Large (67-100%) Fat Layer (Subcutaneous Tissue) Exposed: Yes Necrotic Quality: Adherent Slough Tendon Exposed: No Muscle Exposed: No Joint Exposed: No Bone Exposed: No Metayer, Finnegan C. ([id number]) Periwound Skin Texture Texture Color No Abnormalities Noted: No No Abnormalities Noted:  No Callus: No Atrophie Blanche: No Crepitus: No Cyanosis: No Excoriation: No Ecchymosis: No Induration: No Erythema: No Rash: No Hemosiderin Staining: Yes Scarring: Yes Mottled: No Pallor: No Moisture Rubor: No No Abnormalities Noted: No Dry / Scaly: No Temperature / Pain Maceration: Yes Temperature: No Abnormality Tenderness on Palpation: Yes Wound Preparation Ulcer Cleansing: Rinsed/Irrigated with Saline Topical Anesthetic Applied: Other: lidocaine 4%, Treatment Notes Wound #1 (Right, Lateral Malleolus) 1. Cleansed with: Clean wound with Normal Saline 2. Anesthetic Topical Lidocaine 4% cream to wound bed prior to debridement 4. Dressing Applied: Hydrogel Other dressing (specify in notes) 5. Secondary Dressing Applied ABD Pad Kerlix/Conform Notes drawtex, sorbact Electronic Signature(s) Signed: 07/31/2017 5:20:26 PM By: Alejandro Mulling Entered By: Alejandro Mulling on 07/30/2017 08:11:56 [name] ([id number]) -------------------------------------------------------------------------------- Wound Assessment Details Patient Name: [name] Date of Service: 07/30/2017 8:00 AM Medical Record Number: [id number] Patient Account Number: [id number] Date of Birth/Sex: [birth year] (71 y.o. Male) Treating RN: Ashok Cordia, [name] Primary Care Concepcion Kirkpatrick: [name] Other Clinician: Referring Meekah Math: [name] Treating Bill Yohn/Extender: STONE III, HOYT Weeks in Treatment: 80 Wound Status Wound Number: 2 Primary Venous Leg Ulcer Etiology: Wound Location: Right Malleolus - Medial Wound Open Wounding Event: Gradually Appeared Status: Date Acquired: 01/30/2016 Comorbid Cataracts, Chronic Obstructive Pulmonary Weeks Of Treatment: 78 History: Disease (COPD), Osteoarthritis Clustered Wound: Yes Photos Photo Uploaded By: Alejandro Mulling on 07/30/2017 14:30:50 Wound Measurements Length: (cm) 3.5 Width: (cm) 3.2 Depth: (cm) 0.1 Area: (cm)  8.796 Volume: (cm) 0.88 % Reduction in Area: 23.8% % Reduction in Volume: 23.8% Epithelialization: None Tunneling: No Undermining: No Wound Description Full Thickness Without Exposed Support Classification: Structures Wound Margin: Distinct, outline attached Exudate Large Amount: Exudate Type: Serous Exudate Color: amber Foul Odor After Cleansing: No Slough/Fibrino Yes Wound Bed Granulation Amount: Small (1-33%) Exposed Structure Granulation Quality: Red Fascia Exposed: No Necrotic Amount: Large (67-100%) Fat Layer (Subcutaneous Tissue) Exposed: Yes Necrotic Quality: Adherent Slough Tendon Exposed: No Muscle Exposed: No Joint Exposed: No Bone Exposed: No Martenson, [name] ([id number]) Periwound Skin Texture Texture Color No Abnormalities Noted: No No Abnormalities Noted: No Callus: No Atrophie Blanche: No Crepitus: No Cyanosis: No Excoriation: No Ecchymosis: No Induration: No Erythema: No Rash: No Hemosiderin Staining: Yes Scarring: Yes Mottled:  No Pallor: No Moisture Rubor: No No Abnormalities Noted: No Dry / Scaly: No Temperature / Pain Maceration: Yes Temperature: No Abnormality Tenderness on Palpation: Yes Wound Preparation Ulcer Cleansing: Rinsed/Irrigated with Saline Topical Anesthetic Applied: Other: lidocaine 4%, Treatment Notes Wound #2 (Right, Medial Malleolus) 1. Cleansed with: Clean wound with Normal Saline 2. Anesthetic Topical Lidocaine 4% cream to wound bed prior to debridement 4. Dressing Applied: Hydrogel Other dressing (specify in notes) 5. Secondary Dressing Applied ABD Pad Kerlix/Conform Notes drawtex, sorbact Electronic Signature(s) Signed: 07/31/2017 5:20:26 PM By: Alejandro MullingPinkerton, Debra Entered By: Alejandro MullingPinkerton, Debra on 07/30/2017 08:11:08 [name] StackBLACKWELL, Haydan C. ([id number]) -------------------------------------------------------------------------------- Vitals Details Patient Name: [name] StackBLACKWELL, [name] Date of Service:  07/30/2017 8:00 AM Medical Record Number: [id number] Patient Account Number: [id number] Date of Birth/Sex: [birth year] [age] y.o. Male) Treating RN: Ashok CordiaPinkerton, [name] Primary Care Theoren Palka: Darreld McleanMILES, LINDA Other Clinician: Referring Shalae Belmonte: Darreld McleanMILES, LINDA Treating Caitlyn Buchanan/Extender: STONE III, HOYT Weeks in Treatment: 80 Vital Signs Time Taken: 08:05 Temperature (F): 98.0 Height (in): 76 Pulse (bpm): 73 Weight (lbs): 238 Respiratory Rate (breaths/min): 18 Body Mass Index (BMI): 29 Blood Pressure (mmHg): 138/80 Reference Range: 80 - 120 mg / dl Electronic Signature(s) Signed: 07/31/2017 5:20:26 PM By: Alejandro MullingPinkerton, Debra Entered By: Alejandro MullingPinkerton, Debra on 07/30/2017 08:07:11

## 2017-08-01 NOTE — Progress Notes (Signed)
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 granulation and advancing epithelialization 11/20/16; patient's dimensions improving laterally by about half a centimeter in length. Otherwise no change on the medial side. Using [facility] 12/04/16; no major change in patient's wound dimensions. Intake nurse reports more drainage. 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 He is voicing no complaints or concerns. He has been tolerating his twice weekly compression therapy  changes 12/11/16 Patient complains of pain and increased drainage.. wants hydrofera blue 12/18/16 improvement. Sorbact 12/25/16; medial wound is smaller, lateral measures the same. Still on sorbact 01/01/17; medial wound continues to be smaller, lateral measures about the same however there is clearly advancing epithelialization here  as well in fact I think the wound will ultimately divided into 2 open areas 01/08/17; unfortunately today fairly significant regression in several areas. Surface of the lateral wound covered again in adherent necrotic material which is difficult to debridement. He has significant surrounding skin maceration. The expanding area of tissue epithelialization in the middle of the wound that was encouraging last week appears to be smaller. There is no surrounding tenderness. The area on the medial leg also did not seem to be as healthy as last week, the reason for this regression this week is not totally clear. We have been using Sorbact for the last 4 weeks. We'll switched of polymen AG which we will order via home medical supply. If there is a problem with this would switch back to Iodoflex 01/15/18; drainage,odor. No change. Switched to polymen last week 01/22/17; still continuous drainage. Culture I did last week showed a few Proteus pansensitive. I did this culture because of drainage. Put him on Augmentin which she has been taking since Saturday however he is developed 4-5 liquid bowel movements. He is also on Coumadin. Beyond this wound is not changed at all, still nonviable necrotic surface material which I debrided reveals healthy granulation line 01/29/17; still copious amounts of drainage reported by her intake nurse. Wound measuring slightly smaller. Currently fact on Iodoflex although I'm looking forward to changing back to [facility] polymen AG 02/05/17; still large amounts of drainage and presenting with really large amounts of adherent slough and necrotic  material over the remaining open area of the wound. We have been using Iodoflex but with little improvement in the surface. [facility] 02/12[facility]; still large amount of drainage. Much less adherent slough however. Started [employer]	[place] [place] last week [name] StackBLACKWELL, Yoseph C. ([id number]) 02/19/17; drainage is better this week. Much less adherent slough. Perhaps some improvement in dimensions. [facility]; severe venous insufficiency wounds on the right lateral and right medial leg. Drainage is some better and slough is less adherent we've been using Hydrofera Blue 03/05/17 on evaluation today patient appears to be doing well. His wounds have been decreasing in size and overall he is pleased with how this is progressing. We are awaiting approval for the epigraph which has previously been recommended in the meantime the [facility] is to be doing very well for him. 03/12/17; wound dimensions are smaller still using Hydrofera Blue. Comes in on Fridays for a dressing change. 03/19/17; wound dimensions continued to contract. Healing of this wound is complicated by continuous significant drainage as well as recurrent buildup of necrotic surface material. We looked into Apligraf and he has a $290 co-pay per application but truthfully I think the drainage as well as the nonviable surface would preclude use of Apligraf there are any other skin substitute at this point therefore the continued plan will be debridement each clinic visit, 2 times a week dressing changes and continued use of Hydrofera Blue. improvement has been very slow but sustained 03/26/17 perhaps slight improvements in peripheral epithelialization is especially inferiorly. Still with large amount of drainage and tightly adherent necrotic surface on arrival. Along with the intake nurse I reviewed previous treatment. He worsened on Iodoflex and had a 4 week trial of sorbact, Polymen AG and a long courses of Aquacel  Ag. He is not a candidate for advanced treatment options for many reasons 04/09/17 on evaluation today patient's right lower extremity wounds appear to be doing a little better. Fortunately he has no significant  discomfort and has been tolerating the dressing changes including the wraps without complication. With that being said he really does not have swelling anymore compared to what he has had in the past following his vascular intervention. He wonders if potentially we could attempt avoiding the rats to see if he could cleanse the wound in between to try to prevent some of the fiber and its buildup that occurs in the interim between when we see him week to week. No fevers, chills, nausea, or vomiting noted at this time. 04/16/17; noted that the staff made a choice last week not to put him in compression. The patient is changing his dressing at home using [facility] and changing every second day. His wife is washing the wound with saline. He is using kerlix. Surprisingly he has not developed a lot of edema. This choice was made because of the degree of fluid retention and maceration even with changing his dressing twice a week 04/23/17; absolutely no change. Using Hydrofera Blue. Recurrent tightly adherent nonviable surface material. This is been refractory to Iodoflex, sorbact and now Hydrofera Blue. More recently he has been changing his own dressings at home, cleansing the wound in the shower. He has not developed lower extremity edema 04/30/17; if anything the wound is larger still in adherent surface although it debrided easily today. I've been using Mehta honey for 1 week and I'd like to try and do it for a second week this see if we can get some form of viable surface here. 05/07/17; patient arrives with copious amounts of drainage and some pain in the superior part of the wound. He has not been systemically unwell. He's been changing this daily at home 05/14/17; patient arrives today  complaining of less drainage and less pain. Dimensions slightly better. Surface culture I did of this last week grew MSSA I put him on dicloxacillin for 10 days. Patient requesting a further prescription since he feels so much better. He did not obtain the [facility] AG for reasons that aren't clear, he has been using Hydrofera Blue 05/21/17; perhaps some less drainage and less pain. He is completing another week of doxycycline. Unfortunately there is no change in the wound measurements are appearance still tightly adherent necrotic surface material that is really defied treatment. We have been using Hydrofera Blue most recently. He did not manage to get Anasept. He was told it was prescription at [facility] 05/29/17; absolutely no improvement in these wound areas. He had remote plastic surgery with who was done at [facility] in [place] surrounding this area. This was a traumatic wound with extensive plastic surgery/skin grafting at that time. I switched him to sorbact last week to see if he can do anything about the recurrent necrotic surface and drainage. This is largely been refractory to Iodoflex/Hydrofera Blue/alginates. I believe we tried Elby Showers and more recently Medi honey l however the drainage is really too excessive 06/04/17; patient went to see Dr. Ulice Bold. Per the patient she ordered him a compression stocking. Continued the Anasept gel and sorbact was already on. No major improvement in the condition of the wounds in fact the medial wound looks larger. Again per the patient she did not feel a skin graft or operative debridement was indicated The patient had previous venous ablation in February 2018. Last arterial studies were in December 2017 and were within normal limits 06/18/17; patient arrives back in clinic today using Anasept gel with sorbact. He changes this every day is wearing his own compression stocking.  The patient actually saw Dr. Leta Baptist of plastic surgery. I've  reviewed her note. The appointment was on 05/30/17. The basic issue with here was that she did not feel that skin grafts for patients with underlying stasis and edema have a good long-term success rate. She also discuss skin substitutes which has been done here as well however I have not been able to get the surface of these wounds to something that I think would support skin substitutes. This is why he felt he might need an TONG, PIECZYNSKI. ([id number]) operative debridement more aggressively than I can do in this clinic Review of systems; is otherwise negative he feels well 07/02/17; patient has been using Anasept gel with Sorbact. He changes this every day scrubs out the wound beds. Arrives today with the wound bed looking some better. Easier debridement 07/16/17; patient has been using Anasept gel was sorbact for about a month now. The necrotic service on his wound bed is better and the drainage is now down to minimal but we've not made any improvements in the epithelialization. Change to Santyl today. Surface of the wound is still not good enough to support skin substitutes 07/30/17 on evaluation today patient appears to be doing very well in regard to his right bimalleolus wounds. He has been tolerating the treatment with the Anasept gel and sorbact. However we were going to try and switch to Santyl to be more aggressive with these wounds. This was however cost prohibitive. Therefore we will likely go back to the sorbact. Nonetheless he is not having any significant discomfort he still is forming slough over the wound location but nothing too significant. The wounds do appear to be filling in nicely. Patient History Information obtained from Patient. Social History Former smoker - 10 years ago, Marital Status - Married, Alcohol Use - Moderate, Drug Use - No History, Caffeine Use - Never. Medical And Surgical History Notes Constitutional Symptoms (General Health) Vein Filter (groin  area); CODA; H/O Blood Clots; pulmonary hypertensive arterial disease Review of Systems (ROS) Constitutional Symptoms (General Health) Denies complaints or symptoms of Fever, Chills. Respiratory The patient has no complaints or symptoms. Cardiovascular The patient has no complaints or symptoms. Psychiatric The patient has no complaints or symptoms. Objective Constitutional Well-nourished and well-hydrated in no acute distress. Vitals Time Taken: 8:05 AM, Height: 76 in, Weight: 238 lbs, BMI: 29, Temperature: 98.0 F, Pulse: 73 bpm, Respiratory Rate: 18 breaths/min, Blood Pressure: 138/80 mmHg. Respiratory normal breathing without difficulty. Psychiatric this patient is able to make decisions and demonstrates good insight into disease process. Alert and Oriented x 3. pleasant and cooperative. MAGDIEL, BARTLES ([id number]) General Notes: Patient has slough noted over the surface of the wound but there does not appear to be any evidence of infection at this point which is excellent news. I did sharply debride both wounds today with a curette and patient tolerated this well without complication or significant discomfort. Integumentary (Hair, Skin) Wound #1 status is Open. Original cause of wound was Gradually Appeared. The wound is located on the Right,Lateral Malleolus. The wound measures 4.9cm length x 2.6cm width x 0.2cm depth; 10.006cm^2 area and 2.001cm^3 volume. There is Fat Layer (Subcutaneous Tissue) Exposed exposed. There is no tunneling or undermining noted. There is a large amount of serous drainage noted. The wound margin is distinct with the outline attached to the wound base. There is small (1-33%) red granulation within the wound bed. There is a large (67-100%) amount of necrotic tissue within the wound bed  including Adherent Slough. The periwound skin appearance exhibited: Scarring, Maceration, Hemosiderin Staining. The periwound skin appearance did not exhibit: Callus,  Crepitus, Excoriation, Induration, Rash, Dry/Scaly, Atrophie Blanche, Cyanosis, Ecchymosis, Mottled, Pallor, Rubor, Erythema. Periwound temperature was noted as No Abnormality. The periwound has tenderness on palpation. Wound #2 status is Open. Original cause of wound was Gradually Appeared. The wound is located on the Right,Medial Malleolus. The wound measures 3.5cm length x 3.2cm width x 0.1cm depth; 8.796cm^2 area and 0.88cm^3 volume. There is Fat Layer (Subcutaneous Tissue) Exposed exposed. There is no tunneling or undermining noted. There is a large amount of serous drainage noted. The wound margin is distinct with the outline attached to the wound base. There is small (1-33%) red granulation within the wound bed. There is a large (67-100%) amount of necrotic tissue within the wound bed including Adherent Slough. The periwound skin appearance exhibited: Scarring, Maceration, Hemosiderin Staining. The periwound skin appearance did not exhibit: Callus, Crepitus, Excoriation, Induration, Rash, Dry/Scaly, Atrophie Blanche, Cyanosis, Ecchymosis, Mottled, Pallor, Rubor, Erythema. Periwound temperature was noted as No Abnormality. The periwound has tenderness on palpation. 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 There was a Skin/Subcutaneous Tissue Debridement ([id number]) debridement with total area of 12.74 sq cm performed by STONE III, Lamoine Magallon E., PA-C. with the following instrument(s): Curette to remove Viable and Non-Viable tissue/material including Exudate, Fibrin/Slough, and Subcutaneous after achieving pain control using Lidocaine 4% Topical Solution. A time out was conducted at 08:17, prior to the start of the procedure. A Minimum amount of bleeding was controlled with Pressure. The procedure was tolerated well with a pain level of 0 throughout and a pain level of 0 following the procedure. Post Debridement Measurements: 4.9cm length x 2.6cm width x 0.3cm depth; 3.002cm^3 volume. Character of Wound/Ulcer Post Debridement requires further debridement. Severity of Tissue Post Debridement is: Fat layer Biegel, Matan C. ([id number]) exposed. 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 There was a Skin/Subcutaneous Tissue Debridement ([id number]) debridement with total area of 11.2 sq cm performed by STONE III, Ellajane Stong E., PA-C. with the following instrument(s): Curette to remove Viable and Non-Viable tissue/material including Exudate, Fibrin/Slough, and Subcutaneous after achieving pain control using Lidocaine 4% Topical Solution. A time out was conducted at 08:17, prior to the start of the procedure. A Minimum amount of bleeding was controlled with Pressure. The procedure was tolerated well with a pain level of 0 throughout and a pain level of 0 following the procedure. Post Debridement Measurements: 3.5cm length x 3.2cm width x 0.2cm depth; 1.759cm^3 volume. Character of Wound/Ulcer Post Debridement requires further debridement. Severity of Tissue Post Debridement is: Fat layer exposed. Post procedure  Diagnosis Wound #2: Same as Pre-Procedure Plan Wound Cleansing: Wound #1 Right,Lateral Malleolus: Clean wound with Normal Saline. Cleanse  wound with mild soap and water May Shower, gently pat wound dry prior to applying new dressing. Wound #2 Right,Medial Malleolus: Clean wound with Normal Saline. Cleanse wound with mild soap and water May Shower, gently pat wound dry prior to applying new dressing. Anesthetic: Wound #1 Right,Lateral Malleolus: Topical Lidocaine 4% cream applied to wound bed prior to debridement Wound #2 Right,Medial Malleolus: Topical Lidocaine 4% cream applied to wound bed prior to debridement Skin Barriers/Peri-Wound Care: Wound #1 Right,Lateral Malleolus: Barrier cream Wound #2 Right,Medial Malleolus: Barrier cream Primary Wound Dressing: Wound #1 Right,Lateral Malleolus: Hydrogel - Hydrogel used in office Cutimed Sorbact Other: - Anicept Gel at home Wound #2 Right,Medial Malleolus: Hydrogel - in office Cutimed Sorbact Other: - Anicept Gel at home Secondary Dressing: Wound #1 Right,Lateral Malleolus: ABD pad Conform/Kerlix Drawtex Wound #2 Right,Medial Malleolus: ABD pad Conform/Kerlix Montuori, Paige C. ([id number]) Drawtex Dressing Change Frequency: Wound #1 Right,Lateral Malleolus: Change dressing every day. Wound #2 Right,Medial Malleolus: Change dressing every day. Follow-up Appointments: Wound #1 Right,Lateral Malleolus: Return Appointment in 2 weeks. Wound #2 Right,Medial Malleolus: Return Appointment in 2 weeks. Edema Control: Wound #1 Right,Lateral Malleolus: Elevate legs to the level of the heart and pump ankles as often as possible Wound #2 Right,Medial Malleolus: Elevate legs to the level of the heart and pump ankles as often as possible Additional Orders / Instructions: Wound #1 Right,Lateral Malleolus: Increase protein intake. Wound #2 Right,Medial Malleolus: Increase protein intake. Medications-please add to medication  list.: Wound #1 Right,Lateral Malleolus: Other: - Vitamin C, Zinc, MVI Wound #2 Right,Medial Malleolus: Other: - Vitamin C, Zinc, MVI Patient's wound had a fairly good granular bed post debridement today and looked very well. I'm going to recommend that we continue with the Current wound care measures for the next week. Please see above for specific wound care orders. We will see patient for re-evaluation in 1 week here in the clinic. If anything worsens or changes patient will contact our office for additional recommendations. Electronic Signature(s) Signed: 07/30/2017 5:06:08 PM By: Lenda Kelp PA-C Entered By: Lenda Kelp on 07/30/2017 08:31:53 [name] ([id number]) -------------------------------------------------------------------------------- ROS/PFSH Details Patient Name: [name] Date of Service: 07/30/2017 8:00 AM Medical Record Number: [id number] Patient Account Number: [id number] Date of Birth/Sex: 1946-02-14 (71 y.o. Male) Treating RN: Ashok Cordia, [name] Primary Care Provider: [name] Other Clinician: Referring Provider: [name] Treating Provider/Extender: STONE III, Walfred Bettendorf Weeks in Treatment: 80 Information Obtained From Patient Wound History Do you currently have one or more open woundso Yes How many open wounds do you currently haveo 1 Approximately how long have you had your woundso 5 months How have you been treating your wound(s) until nowo saline, dressing Has your wound(s) ever healed and then re-openedo Yes Have you had any lab work done in the past montho No Have you tested positive for an antibiotic resistant organism (MRSA, VRE)o No Have you tested positive for osteomyelitis (bone infection)o No Have you had any tests for circulation on your legso No Constitutional Symptoms (General Health) Complaints and Symptoms: Negative for: Fever; Chills Medical History: Past Medical History Notes: Vein Filter (groin area); CODA;  H/O Blood Clots; pulmonary hypertensive arterial disease Eyes Medical History: Positive for: Cataracts - removed Negative for: Glaucoma; Optic Neuritis Ear/Nose/Mouth/Throat Medical History: Negative for: Chronic sinus problems/congestion Hematologic/Lymphatic Medical History: Negative for: Anemia; Hemophilia; Human Immunodeficiency Virus; Lymphedema; Sickle Cell Disease Respiratory Complaints and Symptoms: No Complaints or Symptoms Medical History: Positive for: Chronic Obstructive Pulmonary Disease (COPD) Negative for: Aspiration; Asthma; Pneumothorax;  Sleep Apnea; Tuberculosis Cardiovascular ALYAAN, BUDZYNSKI. ([id number]) Complaints and Symptoms: No Complaints or Symptoms Medical History: Negative for: Angina; Arrhythmia; Congestive Heart Failure; Coronary Artery Disease; Deep Vein Thrombosis; Hypertension; Hypotension; Myocardial Infarction; Peripheral Arterial Disease; Peripheral Venous Disease; Phlebitis; Vasculitis Gastrointestinal Medical History: Negative for: Cirrhosis ; Colitis; Crohnos; Hepatitis A; Hepatitis B; Hepatitis C Endocrine Medical History: Negative for: Type I Diabetes; Type II Diabetes Genitourinary Medical History: Negative for: End Stage Renal Disease Immunological Medical History: Negative for: Lupus Erythematosus; Raynaudos Integumentary (Skin) Medical History: Negative for: History of Burn; History of pressure wounds Musculoskeletal Medical History: Positive for: Osteoarthritis Negative for: Gout; Rheumatoid Arthritis; Osteomyelitis Neurologic Medical History: Negative for: Dementia; Neuropathy; Quadriplegia; Paraplegia; Seizure Disorder Oncologic Medical History: Negative for: Received Chemotherapy; Received Radiation Psychiatric Complaints and Symptoms: No Complaints or Symptoms Medical History: Negative for: Anorexia/bulimia; Confinement Anxiety HBO Extended History Items ANTONYO, HINDERER.  ([id number]) Eyes: Cataracts Immunizations Pneumococcal Vaccine: Received Pneumococcal Vaccination: No Implantable Devices Family and Social History Former smoker - 10 years ago; Marital Status - Married; Alcohol Use: Moderate; Drug Use: No History; Caffeine Use: Never; Advanced Directives: No; Patient does not want information on Advanced Directives; Living Will: No; Medical Power of Attorney: No Physician Affirmation I have reviewed and agree with the above information. Electronic Signature(s) Signed: 07/30/2017 5:06:08 PM By: Lenda Kelp PA-C Signed: 07/31/2017 5:20:26 PM By: Alejandro Mulling Entered By: Lenda Kelp on 07/30/2017 08:30:52 [name] ([id number]) -------------------------------------------------------------------------------- SuperBill Details Patient Name: [name] Date of Service: 07/30/2017 Medical Record Number: [id number] Patient Account Number: [id number] Date of Birth/Sex: 09-24-1945 (71 y.o. 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, initial encounter T81.31XD Disruption of external operation (surgical) wound, not elsewhere classified, subsequent encounter L97.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 of skin Facility Procedures CPT4 Code: 62952841 Description: 11042 - DEB SUBQ TISSUE 20 SQ CM/< ICD-10 Diagnosis Description L97.212 Non-pressure chronic ulcer of right calf with fat layer  expos Modifier: ed Quantity: 1 CPT4 Code: 32440102 Description: 11045 - DEB SUBQ TISS EA ADDL 20CM ICD-10 Diagnosis Description L97.212 Non-pressure chronic ulcer of right calf with fat layer expos Modifier: ed Quantity: 1 Physician Procedures CPT4 Code: 7253664 Description: 11042 - WC PHYS SUBQ TISS 20 SQ CM ICD-10 Diagnosis Description L97.212 Non-pressure chronic ulcer of right calf with fat layer expose Modifier: d Quantity: 1 CPT4 Code: 4034742 Description: 11045 - WC PHYS SUBQ TISS EA ADDL 20 CM ICD-10 Diagnosis Description 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

## 2017-08-13 ENCOUNTER — Encounter: Payer: Medicare HMO | Admitting: Internal Medicine

## 2017-08-13 DIAGNOSIS — I87331 Chronic venous hypertension (idiopathic) with ulcer and inflammation of right lower extremity: Secondary | ICD-10-CM | POA: Diagnosis not present

## 2017-08-15 NOTE — Progress Notes (Signed)
[name], [name] ([id number]) Visit Report for 08/13/2017 Arrival Information Details Patient Name: [name], [name]. Date of Service: 08/13/2017 8:00 AM Medical Record Number: [id number] Patient Account Number: [id number] Date of Birth/Sex: 1945-11-02 (71 y.o. Male) Treating RN: [name] Primary Care Joziyah Roblero: [name] Other Clinician: Referring Tierrah Anastos: [name] Treating Adama Ivins/Extender: Altamese [place] in Treatment: 82 Visit Information History Since Last Visit All ordered tests and consults were completed: No Patient Arrived: Ambulatory Added or deleted any medications: No Arrival Time: 08:07 Any new allergies or adverse reactions: No Accompanied By: self Had a fall or experienced change in No Transfer Assistance: None activities of daily living that may affect Patient Identification Verified: Yes risk of falls: Secondary Verification Process Yes Signs or symptoms of abuse/neglect since last visito No Completed: Hospitalized since last visit: No Patient Requires Transmission-Based No Has Dressing in Place as Prescribed: Yes Precautions: Pain Present Now: No Patient Has Alerts: Yes Patient Alerts: Patient on Blood Thinner Electronic Signature(s) Signed: 08/13/2017 4:52:40 PM By: Alejandro Mulling Entered By: Alejandro Mulling on 08/13/2017 08:07:45 [name] ([id number]) -------------------------------------------------------------------------------- Clinic Level of Care Assessment Details Patient Name: [name] Date of Service: 08/13/2017 8:00 AM Medical Record Number: [id number] Patient Account Number: [id number] Date of Birth/Sex: November 13, 1945 (71 y.o. Male) Treating RN: Ashok Cordia, [name] Primary Care Farzad Tibbetts: [name] Other Clinician: Referring Avanell Banwart: [name] Treating [place] Frieden/Extender: Altamese Angie in Treatment: 82 Clinic Level of Care Assessment Items TOOL 4 Quantity Score X - Use when  only an EandM is performed on FOLLOW-UP visit 1 0 ASSESSMENTS - Nursing Assessment / Reassessment X - Reassessment of Co-morbidities (includes updates in patient status) 1 10 X- 1 5 Reassessment of Adherence to Treatment Plan ASSESSMENTS - Wound and Skin Assessment / Reassessment []  - Simple Wound Assessment / Reassessment - one wound 0 X- 2 5 Complex Wound Assessment / Reassessment - multiple wounds []  - 0 Dermatologic / Skin Assessment (not related to wound area) ASSESSMENTS - Focused Assessment []  - Circumferential Edema Measurements - multi extremities 0 []  - 0 Nutritional Assessment / Counseling / Intervention []  - 0 Lower Extremity Assessment (monofilament, tuning fork, pulses) []  - 0 Peripheral Arterial Disease Assessment (using hand held doppler) ASSESSMENTS - Ostomy and/or Continence Assessment and Care []  - Incontinence Assessment and Management 0 []  - 0 Ostomy Care Assessment and Management (repouching, etc.) PROCESS - Coordination of Care X - Simple Patient / Family Education for ongoing care 1 15 []  - 0 Complex (extensive) Patient / Family Education for ongoing care []  - 0 Staff obtains Chiropractor, Records, Test Results / Process Orders []  - 0 Staff telephones HHA, Nursing Homes / Clarify orders / etc []  - 0 Routine Transfer to another Facility (non-emergent condition) []  - 0 Routine Hospital Admission (non-emergent condition) []  - 0 New Admissions / Manufacturing engineer / Ordering NPWT, Apligraf, etc. []  - 0 Emergency Hospital Admission (emergent condition) X- 1 10 Simple Discharge Coordination KASEN, ADDUCI. ([id number]) []  - 0 Complex (extensive) Discharge Coordination PROCESS - Special Needs []  - Pediatric / Minor Patient Management 0 []  - 0 Isolation Patient Management []  - 0 Hearing / Language / Visual special needs []  - 0 Assessment of Community assistance (transportation, D/C planning, etc.) []  - 0 Additional assistance / Altered  mentation []  - 0 Support Surface(s) Assessment (bed, cushion, seat, etc.) INTERVENTIONS - Wound Cleansing / Measurement []  - Simple Wound Cleansing - one wound 0 X- 2 5 Complex Wound Cleansing - multiple wounds X- 1  5 Wound Imaging (photographs - any number of wounds) []  - 0 Wound Tracing (instead of photographs) []  - 0 Simple Wound Measurement - one wound X- 2 5 Complex Wound Measurement - multiple wounds INTERVENTIONS - Wound Dressings []  - Small Wound Dressing one or multiple wounds 0 X- 1 15 Medium Wound Dressing one or multiple wounds []  - 0 Large Wound Dressing one or multiple wounds X- 1 5 Application of Medications - topical []  - 0 Application of Medications - injection INTERVENTIONS - Miscellaneous []  - External ear exam 0 []  - 0 Specimen Collection (cultures, biopsies, blood, body fluids, etc.) []  - 0 Specimen(s) / Culture(s) sent or taken to Lab for analysis []  - 0 Patient Transfer (multiple staff / Nurse, adult / Similar devices) []  - 0 Simple Staple / Suture removal (25 or less) []  - 0 Complex Staple / Suture removal (26 or more) []  - 0 Hypo / Hyperglycemic Management (close monitor of Blood Glucose) []  - 0 Ankle / Brachial Index (ABI) - do not check if billed separately X- 1 5 Vital Signs Boling, Vane C. ([id number]) Has the patient been seen at the hospital within the last three years: Yes Total Score: 100 Level Of Care: New/Established - Level 3 Electronic Signature(s) Signed: 08/13/2017 4:52:40 PM By: Alejandro Mulling Entered By: Alejandro Mulling on 08/13/2017 09:28:03 [name] ([id number]) -------------------------------------------------------------------------------- Encounter Discharge Information Details Patient Name: [name] Date of Service: 08/13/2017 8:00 AM Medical Record Number: [id number] Patient Account Number: [id number] Date of Birth/Sex: 06/21/1946 (71 y.o. Male) Treating RN: [name] Primary Care  Zayli Villafuerte: [name] Other Clinician: Referring Deveion Denz: [name] Treating Tyrees Chopin/Extender: [name] Weeks in Treatment: 26 Encounter Discharge Information Items Discharge Pain Level: 0 Discharge Condition: Stable Ambulatory Status: Ambulatory Discharge Destination: Home Private Transportation: Auto Accompanied By: self Schedule Follow-up Appointment: Yes Medication Reconciliation completed and provided No to Patient/Care Essence Merle: Clinical Summary of Care: Electronic Signature(s) Signed: 08/13/2017 9:29:21 AM By: Alejandro Mulling Entered By: Alejandro Mulling on 08/13/2017 09:29:20 [name] ([id number]) -------------------------------------------------------------------------------- Lower Extremity Assessment Details Patient Name: [name] Date of Service: 08/13/2017 8:00 AM Medical Record Number: [id number] Patient Account Number: [id number] Date of Birth/Sex: 1946/03/10 (71 y.o. Male) Treating RN: [name] Primary Care Riley Papin: [name] Other Clinician: Referring Larren Copes: [name] Treating Rada Zegers/Extender: [name] Weeks in Treatment: 82 Vascular Assessment Pulses: Dorsalis Pedis Palpable: [Right:Yes] Posterior Tibial Extremity colors, hair growth, and conditions: Extremity Color: [Right:Normal] Temperature of Extremity: [Right:Warm] Capillary Refill: [Right:< 3 seconds] Toe Nail Assessment Left: Right: Thick: Yes Discolored: Yes Deformed: No Improper Length and Hygiene: No Electronic Signature(s) Signed: 08/13/2017 4:52:40 PM By: Alejandro Mulling Entered By: Alejandro Mulling on 08/13/2017 08:16:32 [name] ([id number]) -------------------------------------------------------------------------------- Multi Wound Chart Details Patient Name: [name] Date of Service: 08/13/2017 8:00 AM Medical Record Number: [id number] Patient Account Number: [id number] Date of Birth/Sex:  1946/07/17 (71 y.o. Male) Treating RN: [name] Primary Care Hosea Hanawalt: [name] Other Clinician: Referring Talayeh Bruinsma: [name] Treating Katlen Seyer/Extender: [name] Weeks in Treatment: 82 Vital Signs Height(in): 76 Pulse(bpm): 79 Weight(lbs): 238 Blood Pressure(mmHg): 159/71 Body Mass Index(BMI): 29 Temperature(F): 98.0 Respiratory Rate 18 (breaths/min): Photos: [1:No Photos] [2:No Photos] [N/A:N/A] Wound Location: [1:Right Malleolus - Lateral] [2:Right Malleolus - Medial] [N/A:N/A] Wounding Event: [1:Gradually Appeared] [2:Gradually Appeared] [N/A:N/A] Primary Etiology: [1:Venous Leg Ulcer] [2:Venous Leg Ulcer] [N/A:N/A] Comorbid History: [1:Cataracts, Chronic Obstructive Cataracts, Chronic Obstructive N/A Pulmonary Disease (COPD), Pulmonary Disease (COPD), Osteoarthritis] [2:Osteoarthritis] Date Acquired: [1:08/11/2015] [2:01/30/2016] [N/A:N/A] Weeks  of Treatment: [1:82] [2:80] [N/A:N/A] Wound Status: [1:Open] [2:Open] [N/A:N/A] Clustered Wound: [1:No] [2:Yes] [N/A:N/A] Measurements L x W x D [1:5x2.5x0.2] [2:3.9x4.2x0.1] [N/A:N/A] (cm) Area (cm) : [1:9.817] [2:12.865] [N/A:N/A] Volume (cm) : [1:1.963] [2:1.286] [N/A:N/A] % Reduction in Area: [1:43.20%] [2:-11.40%] [N/A:N/A] % Reduction in Volume: [1:62.10%] [2:-11.30%] [N/A:N/A] Classification: [1:Full Thickness Without Exposed Support Structures] [2:Full Thickness Without Exposed Support Structures] [N/A:N/A] Exudate Amount: [1:Large] [2:Large] [N/A:N/A] Exudate Type: [1:Serous] [2:Serous] [N/A:N/A] Exudate Color: [1:amber] [2:amber] [N/A:N/A] Wound Margin: [1:Distinct, outline attached] [2:Distinct, outline attached] [N/A:N/A] Granulation Amount: [1:Small (1-33%)] [2:Small (1-33%)] [N/A:N/A] Granulation Quality: [1:Red] [2:Red] [N/A:N/A] Necrotic Amount: [1:Large (67-100%)] [2:Large (67-100%)] [N/A:N/A] Exposed Structures: [1:Fat Layer (Subcutaneous Tissue) Exposed: Yes Fascia: No Tendon: No  Muscle: No Joint: No Bone: No] [2:Fat Layer (Subcutaneous Tissue) Exposed: Yes Fascia: No Tendon: No Muscle: No Joint: No Bone: No] [N/A:N/A] Epithelialization: [1:Small (1-33%)] [2:None] [N/A:N/A] Periwound Skin Texture: [1:Scarring: Yes Excoriation: No Induration: No] [2:Scarring: Yes Excoriation: No Induration: No] [N/A:N/A] Callus: No Callus: No Crepitus: No Crepitus: No Rash: No Rash: No Periwound Skin Moisture: Maceration: Yes Maceration: Yes N/A Dry/Scaly: No Dry/Scaly: No Periwound Skin Color: Hemosiderin Staining: Yes Hemosiderin Staining: Yes N/A Atrophie Blanche: No Atrophie Blanche: No Cyanosis: No Cyanosis: No Ecchymosis: No Ecchymosis: No Erythema: No Erythema: No Mottled: No Mottled: No Pallor: No Pallor: No Rubor: No Rubor: No Temperature: No Abnormality No Abnormality N/A Tenderness on Palpation: Yes Yes N/A Wound Preparation: Ulcer Cleansing: Ulcer Cleansing: N/A Rinsed/Irrigated with Saline Rinsed/Irrigated with Saline Topical Anesthetic Applied: Topical Anesthetic Applied: Other: lidocaine 4% Other: lidocaine 4% Treatment Notes Electronic Signature(s) Signed: 08/14/2017 8:14:14 AM By: [name] MD Entered By: [name] on 08/13/2017 08:27:43 [name] ([id number]) -------------------------------------------------------------------------------- Multi-Disciplinary Care Plan Details Patient Name: [name] Date of Service: 08/13/2017 8:00 AM Medical Record Number: [id number] Patient Account Number: [id number] Date of Birth/Sex: [birth year] (71 y.o. Male) Treating RN: [name] Primary Care Jamonte Curfman: [name] Other Clinician: Referring Lashawnna Lambrecht: [name] Treating Hiliary Osorto/Extender: [name] Weeks in Treatment: 82 Active Inactive ` Venous Leg Ulcer Nursing Diagnoses: Knowledge deficit related to disease process and management Goals: Patient will maintain optimal edema control Date  Initiated: 04/03/2016 Target Resolution Date: 11/24/2016 Goal Status: Active Interventions: Compression as ordered Treatment Activities: Therapeutic compression applied : 04/03/2016 Notes: ` Wound/Skin Impairment Nursing Diagnoses: Impaired tissue integrity Goals: Ulcer/skin breakdown will heal within 14 weeks Date Initiated: 01/17/2016 Target Resolution Date: 11/24/2016 Goal Status: Active Interventions: Assess ulceration(s) every visit Notes: Electronic Signature(s) Signed: 08/13/2017 4:52:40 PM By: Alejandro Mulling Entered By: Alejandro Mulling on 08/13/2017 08:16:53 [name] ([id number]) -------------------------------------------------------------------------------- Pain Assessment Details Patient Name: [name] Date of Service: 08/13/2017 8:00 AM Medical Record Number: [id number] Patient Account Number: [id number] Date of Birth/Sex: 08/22/1946 (71 y.o. Male) Treating RN: [name] Primary Care Heath Badon: [name] Other Clinician: Referring Denard Tuminello: [name] Treating Odus Clasby/Extender: [name] Weeks in Treatment: 82 Active Problems Location of Pain Severity and Description of Pain Patient Has Paino No Site Locations Pain Management and Medication Current Pain Management: Electronic Signature(s) Signed: 08/13/2017 4:52:40 PM By: Alejandro Mulling Entered By: Alejandro Mulling on 08/13/2017 08:07:51 [name] ([id number]) -------------------------------------------------------------------------------- Patient/Caregiver Education Details Patient Name: [name] Date of Service: 08/13/2017 8:00 AM Medical Record Number: [id number] Patient Account Number: [id number] Date of Birth/Gender: 1946/04/16 (71 y.o. Male) Treating RN: [name] Primary Care Physician: [name] Other Clinician: Referring Physician: [name] Treating Physician/Extender: Altamese Geraldine in Treatment: 66 Education  Assessment Education Provided To: Patient  Education Topics Provided Wound/Skin Impairment: Handouts: Other: change dressing as ordered Methods: Demonstration, Explain/Verbal Responses: State content correctly Electronic Signature(s) Signed: 08/13/2017 4:52:40 PM By: Alejandro MullingPinkerton, Debra Entered By: Alejandro MullingPinkerton, Debra on 08/13/2017 09:29:35 [name] StackBLACKWELL, Maicol C. ([id number]) -------------------------------------------------------------------------------- Wound Assessment Details Patient Name: [name] StackBLACKWELL, Ethon C. Date of Service: 08/13/2017 8:00 AM Medical Record Number: [id number] Patient Account Number: [id number] Date of Birth/Sex: 03-Jul-1946 [age] y.o. Male) Treating RN: Ashok CordiaPinkerton, [name] Primary Care Trinten Boudoin: Darreld McleanMILES, LINDA Other Clinician: Referring Arjan Strohm: Darreld McleanMILES, LINDA Treating Ahmaya Ostermiller/Extender: Maxwell CaulOBSON, [name] G Weeks in Treatment: 82 Wound Status Wound Number: 1 Primary Venous Leg Ulcer Etiology: Wound Location: Right Malleolus - Lateral Wound Open Wounding Event: Gradually Appeared Status: Date Acquired: 08/11/2015 Comorbid Cataracts, Chronic Obstructive Pulmonary Weeks Of Treatment: 82 History: Disease (COPD), Osteoarthritis Clustered Wound: No Photos Photo Uploaded By: Alejandro MullingPinkerton, Debra on 08/13/2017 14:35:25 Wound Measurements Length: (cm) 5 Width: (cm) 2.5 Depth: (cm) 0.2 Area: (cm) 9.817 Volume: (cm) 1.963 % Reduction in Area: 43.2% % Reduction in Volume: 62.1% Epithelialization: Small (1-33%) Tunneling: No Undermining: No Wound Description Full Thickness Without Exposed Support Classification: Structures Wound Margin: Distinct, outline attached Exudate Large Amount: Exudate Type: Serous Exudate Color: amber Foul Odor After Cleansing: No Slough/Fibrino Yes Wound Bed Granulation Amount: Small (1-33%) Exposed Structure Granulation Quality: Red Fascia Exposed: No Necrotic Amount: Large (67-100%) Fat Layer (Subcutaneous Tissue) Exposed: Yes Necrotic  Quality: Adherent Slough Tendon Exposed: No Muscle Exposed: No Joint Exposed: No Bone Exposed: No Wyse, Lowen C. ([id number]) Periwound Skin Texture Texture Color No Abnormalities Noted: No No Abnormalities Noted: No Callus: No Atrophie Blanche: No Crepitus: No Cyanosis: No Excoriation: No Ecchymosis: No Induration: No Erythema: No Rash: No Hemosiderin Staining: Yes Scarring: Yes Mottled: No Pallor: No Moisture Rubor: No No Abnormalities Noted: No Dry / Scaly: No Temperature / Pain Maceration: Yes Temperature: No Abnormality Tenderness on Palpation: Yes Wound Preparation Ulcer Cleansing: Rinsed/Irrigated with Saline Topical Anesthetic Applied: Other: lidocaine 4%, Treatment Notes Wound #1 (Right, Lateral Malleolus) 1. Cleansed with: Clean wound with Normal Saline 2. Anesthetic Topical Lidocaine 4% cream to wound bed prior to debridement 3. Peri-wound Care: Barrier cream 4. Dressing Applied: Other dressing (specify in notes) 5. Secondary Dressing Applied ABD Pad Dry Gauze Kerlix/Conform 7. Secured with Tape Notes drawtex, silvercel Electronic Signature(s) Signed: 08/13/2017 4:52:40 PM By: Alejandro MullingPinkerton, Debra Entered By: Alejandro MullingPinkerton, Debra on 08/13/2017 08:15:36 [name] StackBLACKWELL, Graydon C. ([id number]) -------------------------------------------------------------------------------- Wound Assessment Details Patient Name: [name] StackBLACKWELL, Cliffton C. Date of Service: 08/13/2017 8:00 AM Medical Record Number: [id number] Patient Account Number: [id number] Date of Birth/Sex: 03-Jul-1946 100(71 y.o. Male) Treating RN: Ashok CordiaPinkerton, [name] Primary Care Tresten Pantoja: Darreld McleanMILES, LINDA Other Clinician: Referring Jeromie Gainor: Darreld McleanMILES, LINDA Treating Chandlor Noecker/Extender: Maxwell CaulOBSON, [name] G Weeks in Treatment: 82 Wound Status Wound Number: 2 Primary Venous Leg Ulcer Etiology: Wound Location: Right Malleolus - Medial Wound Open Wounding Event: Gradually Appeared Status: Date Acquired:  01/30/2016 Comorbid Cataracts, Chronic Obstructive Pulmonary Weeks Of Treatment: 80 History: Disease (COPD), Osteoarthritis Clustered Wound: Yes Photos Photo Uploaded By: Alejandro MullingPinkerton, Debra on 08/13/2017 14:35:26 Wound Measurements Length: (cm) 3.9 Width: (cm) 4.2 Depth: (cm) 0.1 Area: (cm) 12.865 Volume: (cm) 1.286 % Reduction in Area: -11.4% % Reduction in Volume: -11.3% Epithelialization: None Tunneling: No Undermining: No Wound Description Full Thickness Without Exposed Support Classification: Structures Wound Margin: Distinct, outline attached Exudate Large Amount: Exudate Type: Serous Exudate Color: amber Foul Odor After Cleansing: No Slough/Fibrino Yes Wound Bed Granulation Amount: Small (1-33%) Exposed Structure Granulation Quality: Red Fascia Exposed: No Necrotic Amount: Large (67-100%) Fat Layer (Subcutaneous Tissue) Exposed: Yes Necrotic Quality:  Adherent Slough Tendon Exposed: No Muscle Exposed: No Joint Exposed: No Bone Exposed: No Langsam, Bingham C. ([id number]) Periwound Skin Texture Texture Color No Abnormalities Noted: No No Abnormalities Noted: No Callus: No Atrophie Blanche: No Crepitus: No Cyanosis: No Excoriation: No Ecchymosis: No Induration: No Erythema: No Rash: No Hemosiderin Staining: Yes Scarring: Yes Mottled: No Pallor: No Moisture Rubor: No No Abnormalities Noted: No Dry / Scaly: No Temperature / Pain Maceration: Yes Temperature: No Abnormality Tenderness on Palpation: Yes Wound Preparation Ulcer Cleansing: Rinsed/Irrigated with Saline Topical Anesthetic Applied: Other: lidocaine 4%, Treatment Notes Wound #2 (Right, Medial Malleolus) 1. Cleansed with: Clean wound with Normal Saline 2. Anesthetic Topical Lidocaine 4% cream to wound bed prior to debridement 3. Peri-wound Care: Barrier cream 4. Dressing Applied: Other dressing (specify in notes) 5. Secondary Dressing Applied ABD Pad Dry  Gauze Kerlix/Conform 7. Secured with Tape Notes drawtex, silvercel Electronic Signature(s) Signed: 08/13/2017 4:52:40 PM By: Alejandro MullingPinkerton, Debra Entered By: Alejandro MullingPinkerton, Debra on 08/13/2017 08:14:35 [name] StackBLACKWELL, Domanick C. ([id number]) -------------------------------------------------------------------------------- Vitals Details Patient Name: [name] StackBLACKWELL, Gleen C. Date of Service: 08/13/2017 8:00 AM Medical Record Number: [id number] Patient Account Number: [id number] Date of Birth/Sex: Jun 07, 1946 [age] y.o. Male) Treating RN: Ashok CordiaPinkerton, [name] Primary Care Jahdiel Krol: Darreld McleanMILES, LINDA Other Clinician: Referring Zeena Starkel: Darreld McleanMILES, LINDA Treating Dannie Woolen/Extender: Maxwell CaulOBSON, [name] G Weeks in Treatment: 82 Vital Signs Time Taken: 08:07 Temperature (F): 98.0 Height (in): 76 Pulse (bpm): 79 Weight (lbs): 238 Respiratory Rate (breaths/min): 18 Body Mass Index (BMI): 29 Blood Pressure (mmHg): 159/71 Reference Range: 80 - 120 mg / dl Electronic Signature(s) Signed: 08/13/2017 4:52:40 PM By: Alejandro MullingPinkerton, Debra Entered By: Alejandro MullingPinkerton, Debra on 08/13/2017 08:10:03

## 2017-08-15 NOTE — Progress Notes (Addendum)
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 still using Hydrofera Blue. Comes in on Fridays for a dressing change.  03/19/17; wound dimensions continued to contract. Healing of this wound is complicated by continuous significant drainage as well as recurrent buildup of necrotic surface material. We looked into Apligraf and he has a $290 co-pay per application but truthfully I think the drainage as well as the nonviable surface would preclude use of Apligraf there are any other skin substitute at this point therefore the continued plan will be debridement each clinic visit,  2 times a week dressing changes and continued use of Hydrofera Blue. improvement has been very slow but sustained 03/26/17 perhaps slight improvements in peripheral epithelialization is especially inferiorly. Still with large amount of drainage and tightly adherent necrotic surface on arrival. Along with the intake nurse I reviewed previous treatment. He worsened on Iodoflex and had a 4 week trial of sorbact, Polymen AG and a long courses of Aquacel Ag. He is not a candidate for advanced treatment options for many reasons 04/09/17 on evaluation today patient's right lower extremity wounds appear to be doing a little better. Fortunately he has no significant discomfort and has been tolerating the dressing changes including the wraps without complication. With that being said he really does not have swelling anymore compared to what he has had in the past following his vascular intervention. He wonders if potentially we could attempt avoiding the rats to see if he could cleanse the wound in between to try to prevent some of the fiber and its buildup that occurs in the interim between when we see him week to week. No fevers, chills, nausea, or vomiting noted at this time. 04/16/17; noted that the staff made a choice last week not to put him in compression. The patient is changing his dressing at home using [facility] and changing every second day. His wife is washing the wound with saline. He is using kerlix. Surprisingly he has not developed a lot of edema. This choice was made because of the degree of fluid retention and maceration even with changing his dressing twice a week 04/23/17; absolutely no change. Using Hydrofera Blue. Recurrent tightly adherent nonviable surface material. This is been refractory to Iodoflex, sorbact and now Hydrofera Blue. More recently he has been changing his own dressings at home, cleansing the wound in the shower. He has not developed lower extremity edema 04/30/17; if  anything the wound is larger still in adherent surface although it debrided easily today. I've been using Mehta honey for 1 week and I'd like to try and do it for a second week this see if we can get some form of viable surface here. 05/07/17; patient arrives with copious amounts of drainage and some pain in the superior part of the wound. He has not been systemically unwell. He's been changing this daily at home 05/14/17; patient arrives today complaining of less drainage and less pain. Dimensions slightly better. Surface culture I did of this last week grew MSSA I put him on dicloxacillin for 10 days. Patient requesting a further prescription since he feels so much better. He did not obtain the [name] for reasons that aren't clear, he has been using Hydrofera Blue 05/21/17; perhaps some less drainage and less pain. He is completing another week of doxycycline. Unfortunately there is no change in the wound measurements are appearance still tightly adherent necrotic surface material that is really defied treatment. We have been using Hydrofera Blue most recently. He did not manage to get Anasept. He was told it was prescription at [facility] 05/29/17; absolutely no improvement in these wound  areas. He had remote plastic surgery with who was done at [name] in [place] surrounding this area. This was a traumatic wound with extensive plastic surgery/skin grafting at that time. I switched him to sorbact last week to see if he can do anything about the recurrent necrotic surface and drainage. This is largely been refractory to Iodoflex/Hydrofera Blue/alginates. I believe we tried Elby Showers and more recently Medi honey l however the drainage is really too excessive 06/04/17; patient went to see Dr. Ulice Bold. Per the patient she ordered him a compression stocking. Continued the Anasept gel and sorbact was already on. No major improvement in the condition of the wounds in fact the medial wound looks  larger. Again per the patient she did not feel a skin graft or operative debridement was indicated The patient had previous venous ablation in February 2018. Last arterial studies were in December 2017 and were within normal limits 06/18/17; patient arrives back in clinic today using Anasept gel with sorbact. He changes this every day is wearing his own compression stocking. The patient actually saw Dr. Leta Baptist of plastic surgery. I've reviewed her note. The appointment was on 05/30/17. The basic issue with here was that she did not feel that skin grafts for patients with underlying stasis and edema have a good long-term success rate. She also discuss skin substitutes which has been done here as well however I have not been able to get the surface of these wounds to something that I think would support skin substitutes. This is why he felt he might need an operative debridement more aggressively than I can do in this clinic Review of systems; is otherwise negative he feels well 07/02/17; patient has been using Anasept gel with Sorbact. He changes this every day scrubs out the wound beds. Arrives today with the wound bed looking some better. Easier debridement 07/16/17; patient has been using Anasept gel was sorbact for about a month now. The necrotic service on his wound bed is Varas, [name]. ([id number]) better and the drainage is now down to minimal but we've not made any improvements in the epithelialization. Change to Santyl today. Surface of the wound is still not good enough to support skin substitutes 07/30/17 on evaluation today patient appears to be doing very well in regard to his right bimalleolus wounds. He has been tolerating the treatment with the Anasept gel and sorbact. However we were going to try and switch to Santyl to be more aggressive with these wounds. This was however cost prohibitive. Therefore we will likely go back to the sorbact. Nonetheless he is not having any  significant discomfort he still is forming slough over the wound location but nothing too significant. The wounds do appear to be filling in nicely. 08/13/17; I have not seen this wound in about a month. There is not much change. The fact the area medially looks larger. He has been using sorbact and Anasept for over a month now without much effect. Arrives in clinic stating that he does not want the area debrided Objective Constitutional Vitals Time Taken: 8:07 AM, Height: 76 in, Weight: 238 lbs, BMI: 29, Temperature: 98.0 F, Pulse: 79 bpm, Respiratory Rate: 18 breaths/min, Blood Pressure: 159/71 mmHg. Cardiovascular Pedal pulses are normal on the right there is no edema. Psychiatric No evidence of depression, anxiety, or agitation. Calm, cooperative, and communicative. Appropriate interactions and affect.. General Notes: Wound exam; the patient continues to have tightly adherent and recurrent necrotic surface debris and slough over the vast majority  of the wound areas on both the lateral and medial wounds. There is no evidence of surrounding infection. In keeping with the patient's wishes today I did not debride this however I told him that the next time he is here he he will definitely need this debrided Integumentary (Hair, Skin) Wound #1 status is Open. Original cause of wound was Gradually Appeared. The wound is located on the Right,Lateral Malleolus. The wound measures 5cm length x 2.5cm width x 0.2cm depth; 9.817cm^2 area and 1.963cm^3 volume. There is Fat Layer (Subcutaneous Tissue) Exposed exposed. There is no tunneling or undermining noted. There is a large amount of serous drainage noted. The wound margin is distinct with the outline attached to the wound base. There is small (1-33%) red granulation within the wound bed. There is a large (67-100%) amount of necrotic tissue within the wound bed including Adherent Slough. The periwound skin appearance exhibited: Scarring,  Maceration, Hemosiderin Staining. The periwound skin appearance did not exhibit: Callus, Crepitus, Excoriation, Induration, Rash, Dry/Scaly, Atrophie Blanche, Cyanosis, Ecchymosis, Mottled, Pallor, Rubor, Erythema. Periwound temperature was noted as No Abnormality. The periwound has tenderness on palpation. Wound #2 status is Open. Original cause of wound was Gradually Appeared. The wound is located on the Right,Medial Malleolus. The wound measures 3.9cm length x 4.2cm width x 0.1cm depth; 12.865cm^2 area and 1.286cm^3 volume. There is Fat Layer (Subcutaneous Tissue) Exposed exposed. There is no tunneling or undermining noted. There is a large amount of serous drainage noted. The wound margin is distinct with the outline attached to the wound base. There is small (1-33%) red granulation within the wound bed. There is a large (67-100%) amount of necrotic tissue within the wound bed including Adherent Slough. The periwound skin appearance exhibited: Scarring, Maceration, Hemosiderin Staining. The periwound skin appearance did not exhibit: Callus, Crepitus, Excoriation, Induration, Rash, Dry/Scaly, Atrophie Blanche, Cyanosis, Ecchymosis, Mottled, Pallor, Rubor, Erythema. Periwound temperature was noted as No Abnormality. The periwound has tenderness on palpation. 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 Cleanse wound with mild soap and water May Shower, gently pat wound dry prior to applying new dressing. Wound #2 Right,Medial Malleolus: Clean wound with Normal Saline. Cleanse wound with mild soap and water May Shower, gently pat wound dry prior to applying new dressing. Anesthetic (add to Medication List): Wound #1 Right,Lateral Malleolus: Topical Lidocaine 4% cream applied to wound bed prior to debridement (In Clinic Only). Wound #2 Right,Medial Malleolus: Topical Lidocaine 4% cream applied to wound bed prior to debridement (In Clinic Only). Skin Barriers/Peri-Wound Care: Wound #1 Right,Lateral Malleolus: Barrier cream Wound #2 Right,Medial Malleolus: Barrier cream Primary Wound Dressing: Wound #1 Right,Lateral Malleolus: Silvercel Non-Adherent - in office Other: - kerracel ag Wound #2 Right,Medial Malleolus: Silvercel Non-Adherent - in office Other: - kerracel ag Secondary Dressing: Wound #1 Right,Lateral Malleolus: ABD pad Conform/Kerlix Drawtex Wound #2 Right,Medial Malleolus: ABD pad Conform/Kerlix Drawtex Dressing Change Frequency: Wound #1 Right,Lateral Malleolus: Leano, Josip [name]. ([id number]) Change dressing every other day. Wound #2  Right,Medial Malleolus: Change dressing every other day. Follow-up Appointments: Return Appointment in 2 [name]. Edema Control: Wound #1 Right,Lateral Malleolus: Elevate legs to the level of the heart and pump ankles as often as possible Wound #2 Right,Medial Malleolus: Elevate legs to the level of the heart and pump ankles as often as possible Additional Orders / Instructions: Wound #1 Right,Lateral Malleolus: Increase protein intake. Wound #2 Right,Medial Malleolus: Increase protein intake. Medications-please add to medication list.: Wound #1 Right,Lateral Malleolus: Other: - Vitamin [name], Zinc, MVI Wound #2 Right,Medial Malleolus: Other: - Vitamin [name], Zinc,  MVI The following medication(s) was prescribed: lidocaine topical 4 % cream 1 1 cream topical kerracel AG f/u 2 [name] will definately need debridement Electronic Signature(s) Signed: 08/16/2017 11:04:16 AM By: Elliot Gurney, BSN, RN, CWS, Kim RN, BSN Signed: 08/18/2017 7:28:22 AM By: [name] MD Previous Signature: 08/14/2017 8:14:14 AM Version By: [name] MD Entered By: Elliot Gurney, BSN, RN, CWS, Kim on 08/16/2017 11:04:15 [name] ([id number]) -------------------------------------------------------------------------------- SuperBill Details Patient Name: [name] Date of Service: 08/13/2017 Medical Record Number: [id number] Patient Account Number: [id number] Date of Birth/Sex: [birth year] (71 y.o. 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, initial encounter T81.31XD Disruption of external operation (surgical) wound, not elsewhere classified, subsequent encounter L97.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 of skin Facility Procedures CPT4 Code: [id number] Description: 99213 - WOUND CARE VISIT-LEV 3 EST PT Modifier: Quantity: 1 Physician Procedures CPT4 Code: [id number] Description: 99212 - WC PHYS LEVEL 2 - EST PT ICD-10 Diagnosis Description L97.212 Non-pressure chronic ulcer of right calf with fat layer expo Modifier: sed Quantity: 1 Electronic Signature(s) Signed: 08/13/2017 9:28:13 AM By: Alejandro Mulling Signed: 08/14/2017 8:14:14 AM By: [name] MD Entered By: Alejandro Mulling on 08/13/2017 09:28:13

## 2017-08-28 ENCOUNTER — Encounter: Payer: Medicare HMO | Attending: Internal Medicine | Admitting: Internal Medicine

## 2017-08-28 ENCOUNTER — Ambulatory Visit: Payer: Medicare HMO | Admitting: Internal Medicine

## 2017-08-28 DIAGNOSIS — Y832 Surgical operation with anastomosis, bypass or graft as the cause of abnormal reaction of the patient, or of later complication, without mention of misadventure at the time of the procedure: Secondary | ICD-10-CM | POA: Diagnosis not present

## 2017-08-28 DIAGNOSIS — T8131XD Disruption of external operation (surgical) wound, not elsewhere classified, subsequent encounter: Secondary | ICD-10-CM | POA: Insufficient documentation

## 2017-08-28 DIAGNOSIS — T85613A 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, initial encounter: Secondary | ICD-10-CM | POA: Diagnosis not present

## 2017-08-28 DIAGNOSIS — L97212 Non-pressure chronic ulcer of right calf with fat layer exposed: Secondary | ICD-10-CM | POA: Diagnosis not present

## 2017-08-28 DIAGNOSIS — I87331 Chronic venous hypertension (idiopathic) with ulcer and inflammation of right lower extremity: Secondary | ICD-10-CM | POA: Insufficient documentation

## 2017-08-28 DIAGNOSIS — L97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 of skin: Secondary | ICD-10-CM | POA: Diagnosis not present

## 2017-08-29 NOTE — Progress Notes (Signed)
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 evaluation today patient appears to be doing well. His wounds have been decreasing in size and overall he is [name], [name]. ([id number]) pleased with how this is progressing. We are awaiting approval for the epigraph which has previously been recommended in the meantime the [facility] Dressing is to be doing very well for him. 03/12/17; wound dimensions are smaller still  using Hydrofera Blue. Comes in on Fridays for a dressing change. 03/19/17; wound dimensions continued to contract. Healing of this wound is complicated by continuous significant drainage as well as recurrent buildup of necrotic surface material. We looked into Apligraf and he has a $290 co-pay per application but truthfully I think the drainage as well as the nonviable surface would preclude use of Apligraf there are any other skin substitute at this point therefore the continued plan will be debridement each clinic visit, 2 times a week dressing changes and continued use of Hydrofera Blue. improvement has been very slow but sustained 03/26/17 perhaps slight improvements in peripheral epithelialization is especially inferiorly. Still with large amount of drainage and tightly adherent necrotic surface on arrival. Along with the intake nurse I reviewed previous treatment. He worsened on Iodoflex and had a 4 week trial of sorbact, Polymen AG and a long courses of Aquacel Ag. He is not a candidate for advanced treatment options for many reasons 04/09/17 on evaluation today patient's right lower extremity wounds appear to be doing a little better. Fortunately he has no significant discomfort and has been tolerating the dressing changes including the wraps without complication. With that being said he really does not have swelling anymore compared to what he has had in the past following his vascular intervention. He wonders if potentially we could attempt avoiding the rats to see if he could cleanse the wound in between to try to prevent some of the fiber and its buildup that occurs in the interim between when we see him week to week. No fevers, chills, nausea, or vomiting noted at this time. 04/16/17; noted that the staff made a choice last week not to put him in compression. The patient is changing his dressing at home using [facility] and changing every second day. His wife is washing the wound with  saline. He is using kerlix. Surprisingly he has not developed a lot of edema. This choice was made because of the degree of fluid retention and maceration even with changing his dressing twice a week 04/23/17; absolutely no change. Using Hydrofera Blue. Recurrent tightly adherent nonviable surface material. This is been refractory to Iodoflex, sorbact and now Hydrofera Blue. More recently he has been changing his own dressings at home, cleansing the wound in the shower. He has not developed lower extremity edema 04/30/17; if anything the wound is larger still in adherent surface although it debrided easily today. I've been using Mehta honey for 1 week and I'd like to try and do it for a second week this see if we can get some form of viable surface here. 05/07/17; patient arrives with copious amounts of drainage and some pain in the superior part of the wound. He has not been systemically unwell. He's been changing this daily at home 05/14/17; patient arrives today complaining of less drainage and less pain. Dimensions slightly better. Surface culture I did of this last week grew MSSA I put him on dicloxacillin for 10 days. Patient requesting a further prescription since he feels so much better. He did  not obtain the [facility] AG for reasons that aren't clear, he has been using Hydrofera Blue 05/21/17; perhaps some less drainage and less pain. He is completing another week of doxycycline. Unfortunately there is no change in the wound measurements are appearance still tightly adherent necrotic surface material that is really defied treatment. We have been using Hydrofera Blue most recently. He did not manage to get Anasept. He was told it was prescription at [facility] 05/29/17; absolutely no improvement in these wound areas. He had remote plastic surgery with who was done at [facility] in [place] surrounding this area. This was a traumatic wound with extensive plastic surgery/skin grafting at  that time. I switched him to sorbact last week to see if he can do anything about the recurrent necrotic surface and drainage. This is largely been refractory to Iodoflex/Hydrofera Blue/alginates. I believe we tried Elby Showers and more recently Medi honey l however the drainage is really too excessive 06/04/17; patient went to see Dr. Ulice Bold. Per the patient she ordered him a compression stocking. Continued the Anasept gel and sorbact was already on. No major improvement in the condition of the wounds in fact the medial wound looks larger. Again per the patient she did not feel a skin graft or operative debridement was indicated The patient had previous venous ablation in February 2018. Last arterial studies were in December 2017 and were within normal limits 06/18/17; patient arrives back in clinic today using Anasept gel with sorbact. He changes this every day is wearing his own compression stocking. The patient actually saw Dr. Leta Baptist of plastic surgery. I've reviewed her note. The appointment was on 05/30/17. The basic issue with here was that she did not feel that skin grafts for patients with underlying stasis and edema have a good long-term success rate. She also discuss skin substitutes which has been done here as well however I have not been able to get the surface of these wounds to something that I think would support skin substitutes. This is why he felt he might need an operative debridement more aggressively than I can do in this clinic Review of systems; is otherwise negative he feels well 07/02/17; patient has been using Anasept gel with Sorbact. He changes this every day scrubs out the wound beds. Arrives today with the wound bed looking some better. Easier debridement 07/16/17; patient has been using Anasept gel was sorbact for about a month now. The necrotic service on his wound bed is [name], [name]. ([id number]) better and the drainage is now down to minimal but we've not  made any improvements in the epithelialization. Change to Santyl today. Surface of the wound is still not good enough to support skin substitutes 07/30/17 on evaluation today patient appears to be doing very well in regard to his right bimalleolus wounds. He has been tolerating the treatment with the Anasept gel and sorbact. However we were going to try and switch to Santyl to be more aggressive with these wounds. This was however cost prohibitive. Therefore we will likely go back to the sorbact. Nonetheless he is not having any significant discomfort he still is forming slough over the wound location but nothing too significant. The wounds do appear to be filling in nicely. 08/13/17; I have not seen this wound in about a month. There is not much change. The fact the area medially looks larger. He has been using sorbact and Anasept for over a month now without much effect. Arrives in clinic stating that he does not  want the area debrided 08/28/17; patient arrives with the areas on his right medial and right lateral ankle worse. The wounds of expanded there is erythema and still drainage. There is no pain and no tenderness. We had been using Keracel AG clearly not doing well. The patient has had previous ablations I'm going to send him back to vascular surgery to see if anything else can be done both wound areas are larger Objective Constitutional Patient is hypertensive.. Pulse regular and within target range for patient.Marland Kitchen Respirations regular, non-labored and within target range.. Temperature is normal and within the target range for the patient.Marland Kitchen appears in no distress. Vitals Time Taken: 8:07 AM, Height: 76 in, Weight: 238 lbs, BMI: 29, Temperature: 98.2 F, Pulse: 74 bpm, Respiratory Rate: 18 breaths/min, Blood Pressure: 140/97 mmHg. Eyes Conjunctivae clear. No discharge. Respiratory Respiratory effort is easy and symmetric bilaterally. Rate is normal at rest and on room  air.Marland Kitchen Lymphatic none palpable in right popliteal or inguinal area. Psychiatric No evidence of depression, anxiety, or agitation. Calm, cooperative, and communicative. Appropriate interactions and affect.. General Notes: wound exam; really a deterioration in both wound areas. Wounds are larger there is surrounding erythema a lot of drainage but no tenderness and I don't think there is actually infection here. Integumentary (Hair, Skin) Wound #1 status is Open. Original cause of wound was Gradually Appeared. The wound is located on the Right,Lateral Malleolus. The wound measures 5.6cm length x 3.1cm width x 0.2cm depth; 13.635cm^2 area and 2.727cm^3 volume. There is Fat Layer (Subcutaneous Tissue) Exposed exposed. There is no tunneling or undermining noted. There is a large amount of serous drainage noted. The wound margin is distinct with the outline attached to the wound base. There is no granulation within the wound bed. There is a large (67-100%) amount of necrotic tissue within the wound bed including Adherent Slough. The periwound skin appearance exhibited: Scarring, Maceration, Hemosiderin Staining. The periwound skin appearance did not exhibit: Callus, Crepitus, Excoriation, Induration, Rash, Dry/Scaly, Atrophie Blanche, Cyanosis, Ecchymosis, Mottled, Pallor, Rubor, Erythema. Periwound temperature was noted as No Abnormality. The periwound has tenderness on palpation. [name], [name] ([id number]) Wound #2 status is Open. Original cause of wound was Gradually Appeared. The wound is located on the Right,Medial Malleolus. The wound measures 4cm length x 4.5cm width x 0.1cm depth; 14.137cm^2 area and 1.414cm^3 volume. There is Fat Layer (Subcutaneous Tissue) Exposed exposed. There is no tunneling or undermining noted. There is a large amount of serous drainage noted. The wound margin is distinct with the outline attached to the wound base. There is no granulation within the wound bed.  There is a large (67-100%) amount of necrotic tissue within the wound bed including Adherent Slough. The periwound skin appearance exhibited: Scarring, Maceration, Hemosiderin Staining. The periwound skin appearance did not exhibit: Callus, Crepitus, Excoriation, Induration, Rash, Dry/Scaly, Atrophie Blanche, Cyanosis, Ecchymosis, Mottled, Pallor, Rubor, Erythema. Periwound temperature was noted as No Abnormality. The periwound has tenderness on palpation. 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 Cleanse  wound with mild soap and water May Shower, gently pat wound dry prior to applying new dressing. Wound #2 Right,Medial Malleolus: Clean wound with Normal Saline. Cleanse wound with mild soap and water May Shower, gently pat wound dry prior to applying new dressing. Anesthetic (add to Medication List): Wound #1 Right,Lateral Malleolus: Topical Lidocaine 4% cream applied to wound bed prior to debridement (In Clinic Only). Wound #2 Right,Medial Malleolus: Topical Lidocaine 4% cream applied to wound bed prior to debridement (In Clinic Only). Skin Barriers/Peri-Wound Care: Wound #1 Right,Lateral Malleolus: Barrier cream Wound #2 Right,Medial Malleolus: Barrier cream Primary Wound Dressing: Wound #1 Right,Lateral Malleolus: Iodoflex Wound #2 Right,Medial  Malleolus: Iodoflex Secondary Dressing: Wound #1 Right,Lateral Malleolus: [name], [name]. ([id number]) ABD pad Conform/Kerlix Drawtex Wound #2 Right,Medial Malleolus: ABD pad Conform/Kerlix Drawtex Dressing Change Frequency: Wound #1 Right,Lateral Malleolus: Change dressing every other day. Wound #2 Right,Medial Malleolus: Change dressing every other day. Follow-up Appointments: Return Appointment in 2 [name]. Edema Control: Wound #1 Right,Lateral Malleolus: Elevate legs to the level of the heart and pump ankles as often as possible Wound #2 Right,Medial Malleolus: Elevate legs to the level of the heart and pump ankles as often as possible Additional Orders / Instructions: Wound #1 Right,Lateral Malleolus: Increase protein intake. Wound #2 Right,Medial Malleolus: Increase protein intake. Medications-please add to medication list.: Wound #1 Right,Lateral Malleolus: Other: - Vitamin [name], Zinc, MVI Wound #2 Right,Medial Malleolus: Other: - Vitamin [name], Zinc, MVI Consults ordered were: Vascular - [facility] The following medication(s) was prescribed: lidocaine topical 4 % cream 1 1 cream topical pentoxifylline oral 400 mg tablet extended release 1 tablet extended release oral tid for venous insufficiency ulcers starting 08/28/2017 #1 I change the primary dressing to Iodoflex. I know we've used this in the past however we have tried just about every other dressing to see about the slough on this area #2 he saw plastic surgery they didn't feel there was anything they could offer him #3 I'm going to make an appointment follow-up with vascular surgery to see if there is anything else that can be done to help these healed. Dakin's wet to dry areas about my last option here Electronic Signature(s) Signed: 08/28/2017 5:48:27 PM By: Baltazar Najjarobson, [name] MD Entered By: Baltazar Najjarobson, [name] on 08/28/2017 09:06:21 [name], [name].  ([id number]) -------------------------------------------------------------------------------- SuperBill Details Patient Name: [name], [name]. Date of Service: 08/28/2017 Medical Record Number: [id number] Patient Account Number: [id number] Date of Birth/Sex: 01-19-1946 [age] y.o. 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, initial encounter T81.31XD Disruption of external operation (surgical) wound, not elsewhere classified, subsequent encounter L97.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 of skin Facility Procedures CPT4 Code: [id number] Description: 99214 - WOUND CARE VISIT-LEV 4 EST PT Modifier: Quantity: 1 Physician Procedures CPT4: Description Modifier Quantity Code [id number] 99213 - WC PHYS LEVEL 3 - EST PT 1 ICD-10 Diagnosis Description I87.331 Chronic venous hypertension (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

## 2017-08-30 ENCOUNTER — Other Ambulatory Visit: Payer: Self-pay

## 2017-08-30 DIAGNOSIS — I8311 Varicose veins of right lower extremity with inflammation: Secondary | ICD-10-CM

## 2017-08-30 NOTE — Progress Notes (Signed)
[name], [name] ([id number]) Visit Report for 08/28/2017 Arrival Information Details Patient Name: [name], [name]. Date of Service: 08/28/2017 8:00 AM Medical Record Number: [id number] Patient Account Number: [id number] Date of Birth/Sex: 1946/01/08 (72 y.o. Male) Treating RN: [name] Primary Care Karmina Zufall: [name] Other Clinician: Referring Kieanna Rollo: [name] Treating Clarabel Marion/Extender: Altamese Beaver in Treatment: 84 Visit Information History Since Last Visit [name] ordered tests and consults were completed: No Patient Arrived: Ambulatory Added or deleted any medications: No Arrival Time: 08:06 Any new allergies or adverse reactions: No Accompanied By: self Had a fall or experienced change in No Transfer Assistance: None activities of daily living that may affect Patient Identification Verified: Yes risk of falls: Secondary Verification Process Yes Signs or symptoms of abuse/neglect since last visito No Completed: Hospitalized since last visit: No Patient Requires Transmission-Based No Has Dressing in Place as Prescribed: Yes Precautions: Pain Present Now: No Patient Has Alerts: Yes Patient Alerts: Patient on Blood Thinner Electronic Signature(s) Signed: 08/28/2017 5:16:05 PM By: Alejandro Mulling Entered By: Alejandro Mulling on 08/28/2017 08:06:31 [name] ([id number]) -------------------------------------------------------------------------------- Clinic Level of Care Assessment Details Patient Name: [name] Date of Service: 08/28/2017 8:00 AM Medical Record Number: [id number] Patient Account Number: [id number] Date of Birth/Sex: Oct 31, 1945 (72 y.o. Male) Treating RN: Ashok Cordia, [name] Primary Care Anabel Lykins: [name] Other Clinician: Referring Iyanah Demont: [name] Treating Cinthia Rodden/Extender: Altamese Bamberg in Treatment: 84 Clinic Level of Care Assessment Items TOOL 4 Quantity Score X - Use when only an  EandM is performed on FOLLOW-UP visit 1 0 ASSESSMENTS - Nursing Assessment / Reassessment X - Reassessment of Co-morbidities (includes updates in patient status) 1 10 X- 1 5 Reassessment of Adherence to Treatment Plan ASSESSMENTS - Wound and Skin Assessment / Reassessment []  - Simple Wound Assessment / Reassessment - one wound 0 X- 2 5 Complex Wound Assessment / Reassessment - multiple wounds []  - 0 Dermatologic / Skin Assessment (not related to wound area) ASSESSMENTS - Focused Assessment []  - Circumferential Edema Measurements - multi extremities 0 []  - 0 Nutritional Assessment / Counseling / Intervention []  - 0 Lower Extremity Assessment (monofilament, tuning fork, pulses) []  - 0 Peripheral Arterial Disease Assessment (using hand held doppler) ASSESSMENTS - Ostomy and/or Continence Assessment and Care []  - Incontinence Assessment and Management 0 []  - 0 Ostomy Care Assessment and Management (repouching, etc.) PROCESS - Coordination of Care []  - Simple Patient / Family Education for ongoing care 0 X- 1 20 Complex (extensive) Patient / Family Education for ongoing care X- 1 10 Staff obtains Chiropractor, Records, Test Results / Process Orders X- 1 10 Staff telephones HHA, Nursing Homes / Clarify orders / etc []  - 0 Routine Transfer to another Facility (non-emergent condition) []  - 0 Routine Hospital Admission (non-emergent condition) []  - 0 New Admissions / Manufacturing engineer / Ordering NPWT, Apligraf, etc. []  - 0 Emergency Hospital Admission (emergent condition) X- 1 10 Simple Discharge Coordination [name], XIA. ([id number]) []  - 0 Complex (extensive) Discharge Coordination PROCESS - Special Needs []  - Pediatric / Minor Patient Management 0 []  - 0 Isolation Patient Management []  - 0 Hearing / Language / Visual special needs []  - 0 Assessment of Community assistance (transportation, D/C planning, etc.) []  - 0 Additional assistance / Altered mentation []   - 0 Support Surface(s) Assessment (bed, cushion, seat, etc.) INTERVENTIONS - Wound Cleansing / Measurement []  - Simple Wound Cleansing - one wound 0 X- 2 5 Complex Wound Cleansing - multiple wounds X- 1 5  Wound Imaging (photographs - any number of wounds) []  - 0 Wound Tracing (instead of photographs) []  - 0 Simple Wound Measurement - one wound X- 2 5 Complex Wound Measurement - multiple wounds INTERVENTIONS - Wound Dressings []  - Small Wound Dressing one or multiple wounds 0 X- 1 15 Medium Wound Dressing one or multiple wounds []  - 0 Large Wound Dressing one or multiple wounds []  - 0 Application of Medications - topical X- 1 10 Application of Medications - injection INTERVENTIONS - Miscellaneous []  - External ear exam 0 []  - 0 Specimen Collection (cultures, biopsies, blood, body fluids, etc.) []  - 0 Specimen(s) / Culture(s) sent or taken to Lab for analysis []  - 0 Patient Transfer (multiple staff / Nurse, adult / Similar devices) []  - 0 Simple Staple / Suture removal (25 or less) []  - 0 Complex Staple / Suture removal (26 or more) []  - 0 Hypo / Hyperglycemic Management (close monitor of Blood Glucose) []  - 0 Ankle / Brachial Index (ABI) - do not check if billed separately X- 1 5 Vital Signs [name], [name] Has the patient been seen at the hospital within the last three years: Yes Total Score: 130 Level Of Care: New/Established - Level 4 Electronic Signature(s) Signed: 08/28/2017 5:16:05 PM By: Alejandro Mulling Entered By: Alejandro Mulling on 08/28/2017 09:40:28 [name] ([id number]) -------------------------------------------------------------------------------- Encounter Discharge Information Details Patient Name: [name] Date of Service: 08/28/2017 8:00 AM Medical Record Number: [id number] Patient Account Number: [id number] Date of Birth/Sex: 1945/12/07 (72 y.o. Male) Treating RN: [name] Primary Care Tanveer Brammer: [name] Other Clinician: Referring Yaslene Lindamood: [name] Treating Tywaun Hiltner/Extender: [name] Weeks in Treatment: 33 Encounter Discharge Information Items Discharge Pain Level: 0 Discharge Condition: Stable Ambulatory Status: Ambulatory Discharge Destination: Home Transportation: Private Auto Accompanied By: self Schedule Follow-up Appointment: Yes Medication Reconciliation completed and No provided to Patient/Care Nguyet Mercer: Patient Clinical Summary of Care: Declined Electronic Signature(s) Signed: 08/30/2017 8:42:55 AM By: Gwenlyn Perking Entered By: Gwenlyn Perking on 08/28/2017 08:37:05 [name] ([id number]) -------------------------------------------------------------------------------- Lower Extremity Assessment Details Patient Name: [name] Date of Service: 08/28/2017 8:00 AM Medical Record Number: [id number] Patient Account Number: [id number] Date of Birth/Sex: 03-02-1946 (72 y.o. Male) Treating RN: [name] Primary Care Anne Boltz: [name] Other Clinician: Referring Trevor Duty: [name] Treating Michala Deblanc/Extender: [name] Weeks in Treatment: 84 Vascular Assessment Pulses: Dorsalis Pedis Palpable: [Right:Yes] Posterior Tibial Extremity colors, hair growth, and conditions: Extremity Color: [Right:Normal] Temperature of Extremity: [Right:Warm] Capillary Refill: [Right:< 3 seconds] Toe Nail Assessment Left: Right: Thick: Yes Discolored: Yes Deformed: No Improper Length and Hygiene: No Electronic Signature(s) Signed: 08/28/2017 5:16:05 PM By: Alejandro Mulling Entered By: Alejandro Mulling on 08/28/2017 08:15:36 [name] ([id number]) -------------------------------------------------------------------------------- Multi Wound Chart Details Patient Name: [name] Date of Service: 08/28/2017 8:00 AM Medical Record Number: [id number] Patient Account Number: [id number] Date of Birth/Sex: 12-10-1945 (72  y.o. Male) Treating RN: [name] Primary Care Elihu Milstein: [name] Other Clinician: Referring Arlissa Monteverde: [name] Treating Bernie Ransford/Extender: [name] Weeks in Treatment: 84 Vital Signs Height(in): 76 Pulse(bpm): 74 Weight(lbs): 238 Blood Pressure(mmHg): 140/97 Body Mass Index(BMI): 29 Temperature(F): 98.2 Respiratory Rate 18 (breaths/min): Photos: [1:No Photos] [2:No Photos] [N/A:N/A] Wound Location: [1:Right Malleolus - Lateral] [2:Right Malleolus - Medial] [N/A:N/A] Wounding Event: [1:Gradually Appeared] [2:Gradually Appeared] [N/A:N/A] Primary Etiology: [1:Venous Leg Ulcer] [2:Venous Leg Ulcer] [N/A:N/A] Comorbid History: [1:Cataracts, Chronic Obstructive Cataracts, Chronic Obstructive N/A Pulmonary Disease (COPD), Pulmonary Disease (COPD), Osteoarthritis] [2:Osteoarthritis] Date Acquired: [1:08/11/2015] [2:01/30/2016] [N/A:N/A]  Weeks of Treatment: [1:84] [2:82] [N/A:N/A] Wound Status: [1:Open] [2:Open] [N/A:N/A] Clustered Wound: [1:No] [2:Yes] [N/A:N/A] Measurements L x W x D [1:5.6x3.1x0.2] [2:4x4.5x0.1] [N/A:N/A] (cm) Area (cm) : [1:13.635] [2:14.137] [N/A:N/A] Volume (cm) : [1:2.727] [2:1.414] [N/A:N/A] % Reduction in Area: [1:21.10%] [2:-22.50%] [N/A:N/A] % Reduction in Volume: [1:47.40%] [2:-22.40%] [N/A:N/A] Classification: [1:Full Thickness Without Exposed Support Structures] [2:Full Thickness Without Exposed Support Structures] [N/A:N/A] Exudate Amount: [1:Large] [2:Large] [N/A:N/A] Exudate Type: [1:Serous] [2:Serous] [N/A:N/A] Exudate Color: [1:amber] [2:amber] [N/A:N/A] Wound Margin: [1:Distinct, outline attached] [2:Distinct, outline attached] [N/A:N/A] Granulation Amount: [1:None Present (0%)] [2:None Present (0%)] [N/A:N/A] Necrotic Amount: [1:Large (67-100%)] [2:Large (67-100%)] [N/A:N/A] Exposed Structures: [1:Fat Layer (Subcutaneous Tissue) Exposed: Yes Fascia: No Tendon: No Muscle: No Joint: No Bone: No] [2:Fat Layer (Subcutaneous  Tissue) Exposed: Yes Fascia: No Tendon: No Muscle: No Joint: No Bone: No] [N/A:N/A] Epithelialization: [1:Small (1-33%)] [2:None] [N/A:N/A] Periwound Skin Texture: [1:Scarring: Yes Excoriation: No Induration: No Callus: No] [2:Scarring: Yes Excoriation: No Induration: No Callus: No] [N/A:N/A] Crepitus: No Crepitus: No Rash: No Rash: No Periwound Skin Moisture: Maceration: Yes Maceration: Yes N/A Dry/Scaly: No Dry/Scaly: No Periwound Skin Color: Hemosiderin Staining: Yes Hemosiderin Staining: Yes N/A Atrophie Blanche: No Atrophie Blanche: No Cyanosis: No Cyanosis: No Ecchymosis: No Ecchymosis: No Erythema: No Erythema: No Mottled: No Mottled: No Pallor: No Pallor: No Rubor: No Rubor: No Temperature: No Abnormality No Abnormality N/A Tenderness on Palpation: Yes Yes N/A Wound Preparation: Ulcer Cleansing: Ulcer Cleansing: N/A Rinsed/Irrigated with Saline Rinsed/Irrigated with Saline Topical Anesthetic Applied: Topical Anesthetic Applied: Other: lidocaine 4% Other: lidocaine 4% Treatment Notes Wound #1 (Right, Lateral Malleolus) 1. Cleansed with: Clean wound with Normal Saline 2. Anesthetic Topical Lidocaine 4% cream to wound bed prior to debridement 3. Peri-wound Care: Barrier cream 4. Dressing Applied: Iodoflex 5. Secondary Dressing Applied ABD Pad Dry Gauze Kerlix/Conform 7. Secured with Tape Notes drawtex Wound #2 (Right, Medial Malleolus) 1. Cleansed with: Clean wound with Normal Saline 2. Anesthetic Topical Lidocaine 4% cream to wound bed prior to debridement 3. Peri-wound Care: Barrier cream 4. Dressing Applied: Iodoflex 5. Secondary Dressing Applied ABD Pad Dry Gauze Kerlix/Conform 7. Secured with Tape [name], [name] ([id number]) Notes drawtex Electronic Signature(s) Signed: 08/28/2017 5:48:27 PM By: Baltazar Najjarobson, [name] MD Entered By: Baltazar Najjarobson, [name] on 08/28/2017 08:59:35 [name], [name]  ([id number]) -------------------------------------------------------------------------------- Multi-Disciplinary Care Plan Details Patient Name: [name], [name] Date of Service: 08/28/2017 8:00 AM Medical Record Number: [id number] Patient Account Number: [id number] Date of Birth/Sex: [birth year](71 y.o. Male) Treating RN: Phillis HaggisPinkerton, [name] Primary Care Juleon Narang: Darreld McleanMILES, LINDA Other Clinician: Referring Dejha King: Darreld McleanMILES, LINDA Treating Eudell Julian/Extender: Maxwell CaulOBSON, [name] G Weeks in Treatment: 6484 Active Inactive ` Venous Leg Ulcer Nursing Diagnoses: Knowledge deficit related to disease process and management Goals: Patient will maintain optimal edema control Date Initiated: 04/03/2016 Target Resolution Date: 11/24/2016 Goal Status: Active Interventions: Compression as ordered Treatment Activities: Therapeutic compression applied : 04/03/2016 Notes: ` Wound/Skin Impairment Nursing Diagnoses: Impaired tissue integrity Goals: Ulcer/skin breakdown will heal within 14 weeks Date Initiated: 01/17/2016 Target Resolution Date: 11/24/2016 Goal Status: Active Interventions: Assess ulceration(s) every visit Notes: Electronic Signature(s) Signed: 08/28/2017 5:16:05 PM By: Alejandro MullingPinkerton, Debra Entered By: Alejandro MullingPinkerton, Debra on 08/28/2017 08:16:02 [name], [name] ([id number]) -------------------------------------------------------------------------------- Pain Assessment Details Patient Name: [name], [name] Date of Service: 08/28/2017 8:00 AM Medical Record Number: [id number] Patient Account Number: [id number] Date of Birth/Sex: [birth year](71 y.o. Male) Treating RN: Phillis HaggisPinkerton, [name] Primary Care Monteen Toops: Darreld McleanMILES, LINDA Other Clinician: Referring Kealan Buchan: Darreld McleanMILES, LINDA Treating Marcellis Frampton/Extender: Maxwell CaulOBSON, [name] G Weeks in Treatment: 1184 Active Problems Location of  Pain Severity and Description of Pain Patient Has Paino No Site Locations Pain Management and Medication Current Pain  Management: Electronic Signature(s) Signed: 08/28/2017 5:16:05 PM By: Alejandro Mulling Entered By: Alejandro Mulling on 08/28/2017 08:07:38 [name] ([id number]) -------------------------------------------------------------------------------- Patient/Caregiver Education Details Patient Name: [name] Date of Service: 08/28/2017 8:00 AM Medical Record Number: [id number] Patient Account Number: [id number] Date of Birth/Gender: 1946/02/21 (72 y.o. Male) Treating RN: [name] Primary Care Physician: [name] Other Clinician: Referring Physician: [name] Treating Physician/Extender: Altamese Hatteras in Treatment: 84 Education Assessment Education Provided To: Patient Education Topics Provided Wound/Skin Impairment: Handouts: Caring for Your Ulcer, Other: change dressing as ordered Methods: Demonstration, Explain/Verbal Responses: State content correctly Electronic Signature(s) Signed: 08/28/2017 5:16:05 PM By: Alejandro Mulling Entered By: Alejandro Mulling on 08/28/2017 08:36:04 [name] ([id number]) -------------------------------------------------------------------------------- Wound Assessment Details Patient Name: [name] Date of Service: 08/28/2017 8:00 AM Medical Record Number: [id number] Patient Account Number: [id number] Date of Birth/Sex: 07-13-1946 (72 y.o. Male) Treating RN: Ashok Cordia, [name] Primary Care Lowen Mansouri: [name] Other Clinician: Referring Lari Linson: [name] Treating Refoel Palladino/Extender: [name] Weeks in Treatment: 84 Wound Status Wound Number: 1 Primary Venous Leg Ulcer Etiology: Wound Location: Right Malleolus - Lateral Wound Open Wounding Event: Gradually Appeared Status: Date Acquired: 08/11/2015 Comorbid Cataracts, Chronic Obstructive Pulmonary Weeks Of Treatment: 84 History: Disease (COPD), Osteoarthritis Clustered Wound: No Photos Photo Uploaded By: Alejandro Mulling on  08/28/2017 17:10:13 Wound Measurements Length: (cm) 5.6 Width: (cm) 3.1 Depth: (cm) 0.2 Area: (cm) 13.635 Volume: (cm) 2.727 % Reduction in Area: 21.1% % Reduction in Volume: 47.4% Epithelialization: Small (1-33%) Tunneling: No Undermining: No Wound Description Full Thickness Without Exposed Support Classification: Structures Wound Margin: Distinct, outline attached Exudate Large Amount: Exudate Type: Serous Exudate Color: amber Foul Odor After Cleansing: No Slough/Fibrino Yes Wound Bed Granulation Amount: None Present (0%) Exposed Structure Necrotic Amount: Large (67-100%) Fascia Exposed: No Necrotic Quality: Adherent Slough Fat Layer (Subcutaneous Tissue) Exposed: Yes Tendon Exposed: No Muscle Exposed: No Joint Exposed: No Bone Exposed: No [name], [name] ([id number]) Periwound Skin Texture Texture Color No Abnormalities Noted: No No Abnormalities Noted: No Callus: No Atrophie Blanche: No Crepitus: No Cyanosis: No Excoriation: No Ecchymosis: No Induration: No Erythema: No Rash: No Hemosiderin Staining: Yes Scarring: Yes Mottled: No Pallor: No Moisture Rubor: No No Abnormalities Noted: No Dry / Scaly: No Temperature / Pain Maceration: Yes Temperature: No Abnormality Tenderness on Palpation: Yes Wound Preparation Ulcer Cleansing: Rinsed/Irrigated with Saline Topical Anesthetic Applied: Other: lidocaine 4%, Treatment Notes Wound #1 (Right, Lateral Malleolus) 1. Cleansed with: Clean wound with Normal Saline 2. Anesthetic Topical Lidocaine 4% cream to wound bed prior to debridement 3. Peri-wound Care: Barrier cream 4. Dressing Applied: Iodoflex 5. Secondary Dressing Applied ABD Pad Dry Gauze Kerlix/Conform 7. Secured with Tape Notes drawtex Electronic Signature(s) Signed: 08/28/2017 5:16:05 PM By: Alejandro Mulling Entered By: Alejandro Mulling on 08/28/2017 08:13:47 [name]  ([id number]) -------------------------------------------------------------------------------- Wound Assessment Details Patient Name: [name] Date of Service: 08/28/2017 8:00 AM Medical Record Number: [id number] Patient Account Number: [id number] Date of Birth/Sex: [birth year] (72 y.o. Male) Treating RN: [name] Primary Care Zakara Parkey: [name] Other Clinician: Referring Estle Huguley: [name] Treating Ziah Turvey/Extender: [name] Weeks in Treatment: 84 Wound Status Wound Number: 2 Primary Venous Leg Ulcer Etiology: Wound Location: Right Malleolus - Medial Wound Open Wounding Event: Gradually Appeared Status: Date Acquired: 01/30/2016 Comorbid Cataracts, Chronic Obstructive Pulmonary Weeks Of Treatment: 82 History: Disease (COPD), Osteoarthritis Clustered Wound: Yes Photos  Photo Uploaded By: Alejandro Mulling on 08/28/2017 17:10:57 Wound Measurements Length: (cm) 4 Width: (cm) 4.5 Depth: (cm) 0.1 Area: (cm) 14.137 Volume: (cm) 1.414 % Reduction in Area: -22.5% % Reduction in Volume: -22.4% Epithelialization: None Tunneling: No Undermining: No Wound Description Full Thickness Without Exposed Support Classification: Structures Wound Margin: Distinct, outline attached Exudate Large Amount: Exudate Type: Serous Exudate Color: amber Foul Odor After Cleansing: No Slough/Fibrino Yes Wound Bed Granulation Amount: None Present (0%) Exposed Structure Necrotic Amount: Large (67-100%) Fascia Exposed: No Necrotic Quality: Adherent Slough Fat Layer (Subcutaneous Tissue) Exposed: Yes Tendon Exposed: No Muscle Exposed: No Joint Exposed: No Bone Exposed: No Ankney, Brigham C. ([id number]) Periwound Skin Texture Texture Color No Abnormalities Noted: No No Abnormalities Noted: No Callus: No Atrophie Blanche: No Crepitus: No Cyanosis: No Excoriation: No Ecchymosis: No Induration: No Erythema: No Rash: No Hemosiderin Staining:  Yes Scarring: Yes Mottled: No Pallor: No Moisture Rubor: No No Abnormalities Noted: No Dry / Scaly: No Temperature / Pain Maceration: Yes Temperature: No Abnormality Tenderness on Palpation: Yes Wound Preparation Ulcer Cleansing: Rinsed/Irrigated with Saline Topical Anesthetic Applied: Other: lidocaine 4%, Treatment Notes Wound #2 (Right, Medial Malleolus) 1. Cleansed with: Clean wound with Normal Saline 2. Anesthetic Topical Lidocaine 4% cream to wound bed prior to debridement 3. Peri-wound Care: Barrier cream 4. Dressing Applied: Iodoflex 5. Secondary Dressing Applied ABD Pad Dry Gauze Kerlix/Conform 7. Secured with Tape Notes drawtex Electronic Signature(s) Signed: 08/28/2017 5:16:05 PM By: Alejandro Mulling Entered By: Alejandro Mulling on 08/28/2017 08:12:37 [name] ([id number]) -------------------------------------------------------------------------------- Vitals Details Patient Name: [name] Date of Service: 08/28/2017 8:00 AM Medical Record Number: [id number] Patient Account Number: [id number] Date of Birth/Sex: 1945/12/19 (72 y.o. Male) Treating RN: Ashok Cordia, [name] Primary Care Rubina Basinski: [name] Other Clinician: Referring Zenya Hickam: [name] Treating Thelton Graca/Extender: [name] Weeks in Treatment: 84 Vital Signs Time Taken: 08:07 Temperature (F): 98.2 Height (in): 76 Pulse (bpm): 74 Weight (lbs): 238 Respiratory Rate (breaths/min): 18 Body Mass Index (BMI): 29 Blood Pressure (mmHg): 140/97 Reference Range: 80 - 120 mg / dl Electronic Signature(s) Signed: 08/28/2017 5:16:05 PM By: Alejandro Mulling Entered By: Alejandro Mulling on 08/28/2017 08:65:78

## 2017-09-10 ENCOUNTER — Other Ambulatory Visit
Admission: RE | Admit: 2017-09-10 | Discharge: 2017-09-10 | Disposition: A | Discharge status: Home / Self Care | Payer: Medicare HMO | Source: Ambulatory Visit | Attending: Physician Assistant | Admitting: Physician Assistant

## 2017-09-10 ENCOUNTER — Encounter: Payer: Medicare HMO | Admitting: Physician Assistant

## 2017-09-10 DIAGNOSIS — S91001A Unspecified open wound, right ankle, initial encounter: Secondary | ICD-10-CM | POA: Insufficient documentation

## 2017-09-10 DIAGNOSIS — X58XXXA Exposure to other specified factors, initial encounter: Secondary | ICD-10-CM | POA: Insufficient documentation

## 2017-09-10 DIAGNOSIS — I87331 Chronic venous hypertension (idiopathic) with ulcer and inflammation of right lower extremity: Secondary | ICD-10-CM | POA: Diagnosis not present

## 2017-09-11 NOTE — Progress Notes (Signed)
REID, REGAS ([id number]) Visit Report for 09/10/2017 Allergy List Details Patient Name: [name], [name]. Date of Service: 09/10/2017 8:00 AM Medical Record Number: [id number] Patient Account Number: [id number] Date of Birth/Sex: [birth year] (72 y.o. Male) Treating RN: [name] Primary Care Erica Richwine: [name] Other Clinician: Referring Semaje Kinker: [name] Treating Exodus Kutzer/Extender: STONE III, HOYT Weeks in Treatment: 86 Allergies Active Allergies iodine Allergy Notes Electronic Signature(s) Signed: 09/10/2017 3:36:56 PM By: Alejandro Mulling Entered By: Alejandro Mulling on 09/10/2017 08:18:57 [name] ([id number]) -------------------------------------------------------------------------------- Arrival Information Details Patient Name: [name] Date of Service: 09/10/2017 8:00 AM Medical Record Number: [id number] Patient Account Number: [id number] Date of Birth/Sex: [birth year] (72 y.o. Male) Treating RN: Ashok Cordia, [name] Primary Care Trana Ressler: [name] Other Clinician: Referring Latoiya Maradiaga: [name] Treating Chrisandra Wiemers/Extender: Linwood Dibbles, HOYT Weeks in Treatment: 19 Visit Information Patient Arrived: Ambulatory Arrival Time: 08:06 Accompanied By: self Transfer Assistance: None Patient Identification Verified: Yes Secondary Verification Process Yes Completed: Patient Requires Transmission-Based No Precautions: Patient Has Alerts: Yes Patient Alerts: Patient on Blood Thinner History Since Last Visit All ordered tests and consults were completed: No Added or deleted any medications: No Any new allergies or adverse reactions: No Had a fall or experienced change in activities of daily living that may affect risk of falls: No Signs or symptoms of abuse/neglect since last visito No Hospitalized since last visit: No Has Dressing in Place as Prescribed: Yes Electronic Signature(s) Signed: 09/10/2017 3:36:56 PM By: Alejandro Mulling Entered By: Alejandro Mulling on 09/10/2017 08:06:51 [name] ([id number]) -------------------------------------------------------------------------------- Clinic Level of Care Assessment Details Patient Name: [name] Date of Service: 09/10/2017 8:00 AM Medical Record Number: [id number] Patient Account Number: [id number] Date of Birth/Sex: [birth year] (72 y.o. Male) Treating RN: Ashok Cordia, [name] Primary Care Kajol Crispen: [name] Other Clinician: Referring Zakiya Sporrer: [name] Treating Buckley Bradly/Extender: STONE III, HOYT Weeks in Treatment: 86 Clinic Level of Care Assessment Items TOOL 4 Quantity Score X - Use when only an EandM is performed on FOLLOW-UP visit 1 0 ASSESSMENTS - Nursing Assessment / Reassessment X - Reassessment of Co-morbidities (includes updates in patient status) 1 10 X- 1 5 Reassessment of Adherence to Treatment Plan ASSESSMENTS - Wound and Skin Assessment / Reassessment []  - Simple Wound Assessment / Reassessment - one wound 0 X- 2 5 Complex Wound Assessment / Reassessment - multiple wounds []  - 0 Dermatologic / Skin Assessment (not related to wound area) ASSESSMENTS - Focused Assessment []  - Circumferential Edema Measurements - multi extremities 0 []  - 0 Nutritional Assessment / Counseling / Intervention []  - 0 Lower Extremity Assessment (monofilament, tuning fork, pulses) []  - 0 Peripheral Arterial Disease Assessment (using hand held doppler) ASSESSMENTS - Ostomy and/or Continence Assessment and Care []  - Incontinence Assessment and Management 0 []  - 0 Ostomy Care Assessment and Management (repouching, etc.) PROCESS - Coordination of Care X - Simple Patient / Family Education for ongoing care 1 15 []  - 0 Complex (extensive) Patient / Family Education for ongoing care []  - 0 Staff obtains Chiropractor, Records, Test Results / Process Orders []  - 0 Staff telephones HHA, Nursing Homes / Clarify orders / etc []  - 0 Routine  Transfer to another Facility (non-emergent condition) []  - 0 Routine Hospital Admission (non-emergent condition) []  - 0 New Admissions / Manufacturing engineer / Ordering NPWT, Apligraf, etc. []  - 0 Emergency Hospital Admission (emergent condition) X- 1 10 Simple Discharge Coordination LAVERN, MASLOW. ([id number]) []  - 0 Complex (extensive) Discharge Coordination PROCESS - Special Needs []  - Pediatric /  Minor Patient Management 0 []  - 0 Isolation Patient Management []  - 0 Hearing / Language / Visual special needs []  - 0 Assessment of Community assistance (transportation, D/C planning, etc.) []  - 0 Additional assistance / Altered mentation []  - 0 Support Surface(s) Assessment (bed, cushion, seat, etc.) INTERVENTIONS - Wound Cleansing / Measurement []  - Simple Wound Cleansing - one wound 0 X- 2 5 Complex Wound Cleansing - multiple wounds X- 1 5 Wound Imaging (photographs - any number of wounds) []  - 0 Wound Tracing (instead of photographs) []  - 0 Simple Wound Measurement - one wound X- 2 5 Complex Wound Measurement - multiple wounds INTERVENTIONS - Wound Dressings []  - Small Wound Dressing one or multiple wounds 0 X- 1 15 Medium Wound Dressing one or multiple wounds []  - 0 Large Wound Dressing one or multiple wounds X- 1 5 Application of Medications - topical []  - 0 Application of Medications - injection INTERVENTIONS - Miscellaneous []  - External ear exam 0 []  - 0 Specimen Collection (cultures, biopsies, blood, body fluids, etc.) []  - 0 Specimen(s) / Culture(s) sent or taken to Lab for analysis []  - 0 Patient Transfer (multiple staff / Nurse, adult / Similar devices) []  - 0 Simple Staple / Suture removal (25 or less) []  - 0 Complex Staple / Suture removal (26 or more) []  - 0 Hypo / Hyperglycemic Management (close monitor of Blood Glucose) []  - 0 Ankle / Brachial Index (ABI) - do not check if billed separately X- 1 5 Vital Signs Mercadel, Elizabeth C.  ([id number]) Has the patient been seen at the hospital within the last three years: Yes Total Score: 100 Level Of Care: New/Established - Level 3 Electronic Signature(s) Signed: 09/10/2017 3:36:56 PM By: Alejandro Mulling Entered By: Alejandro Mulling on 09/10/2017 13:19:36 [name] ([id number]) -------------------------------------------------------------------------------- Encounter Discharge Information Details Patient Name: [name] Date of Service: 09/10/2017 8:00 AM Medical Record Number: [id number] Patient Account Number: [id number] Date of Birth/Sex: 07/12/1946 (72 y.o. Male) Treating RN: [name] Primary Care Malka Bocek: [name] Other Clinician: Referring Dorwin Fitzhenry: [name] Treating Burman Bruington/Extender: STONE III, HOYT Weeks in Treatment: 37 Encounter Discharge Information Items Discharge Pain Level: 0 Discharge Condition: Stable Ambulatory Status: Ambulatory Discharge Destination: Home Transportation: Private Auto Accompanied By: self Schedule Follow-up Appointment: Yes Medication Reconciliation completed and No provided to Patient/Care Aland Chestnutt: Patient Clinical Summary of Care: Declined Electronic Signature(s) Signed: 09/11/2017 11:52:14 AM By: Gwenlyn Perking Entered By: Gwenlyn Perking on 09/10/2017 08:56:28 [name] ([id number]) -------------------------------------------------------------------------------- Lower Extremity Assessment Details Patient Name: [name] Date of Service: 09/10/2017 8:00 AM Medical Record Number: [id number] Patient Account Number: [id number] Date of Birth/Sex: 09-29-1945 (72 y.o. Male) Treating RN: [name] Primary Care Octavis Sheeler: [name] Other Clinician: Referring Giancarlos Berendt: [name] Treating Parilee Hally/Extender: STONE III, HOYT Weeks in Treatment: 86 Vascular Assessment Pulses: Dorsalis Pedis Palpable: [Right:Yes] Posterior Tibial Extremity colors, hair growth, and  conditions: Extremity Color: [Right:Normal] Temperature of Extremity: [Right:Warm] Capillary Refill: [Right:< 3 seconds] Toe Nail Assessment Left: Right: Thick: Yes Discolored: Yes Deformed: No Improper Length and Hygiene: No Electronic Signature(s) Signed: 09/10/2017 3:36:56 PM By: Alejandro Mulling Entered By: Alejandro Mulling on 09/10/2017 08:15:13 [name] ([id number]) -------------------------------------------------------------------------------- Multi Wound Chart Details Patient Name: [name] Date of Service: 09/10/2017 8:00 AM Medical Record Number: [id number] Patient Account Number: [id number] Date of Birth/Sex: Jun 24, 1946 (72 y.o. Male) Treating RN: [name] Primary Care Snigdha Howser: [name] Other Clinician: Referring Jordon Bourquin: [name] Treating Kentaro Alewine/Extender: STONE III, HOYT Weeks in Treatment: 44  Vital Signs Height(in): 76 Pulse(bpm): 87 Weight(lbs): 238 Blood Pressure(mmHg): 123/63 Body Mass Index(BMI): 29 Temperature(F): 98.1 Respiratory Rate 18 (breaths/min): Photos: [1:No Photos] [2:No Photos] [N/A:N/A] Wound Location: [1:Right Malleolus - Lateral] [2:Right Malleolus - Medial] [N/A:N/A] Wounding Event: [1:Gradually Appeared] [2:Gradually Appeared] [N/A:N/A] Primary Etiology: [1:Venous Leg Ulcer] [2:Venous Leg Ulcer] [N/A:N/A] Comorbid History: [1:Cataracts, Chronic Obstructive Cataracts, Chronic Obstructive N/A Pulmonary Disease (COPD), Pulmonary Disease (COPD), Osteoarthritis] [2:Osteoarthritis] Date Acquired: [1:08/11/2015] [2:01/30/2016] [N/A:N/A] Weeks of Treatment: [1:86] [2:84] [N/A:N/A] Wound Status: [1:Open] [2:Open] [N/A:N/A] Clustered Wound: [1:No] [2:Yes] [N/A:N/A] Measurements L x W x D [1:5.8x3.7x0.2] [2:4.7x5x0.1] [N/A:N/A] (cm) Area (cm) : [1:16.855] [2:18.457] [N/A:N/A] Volume (cm) : [1:3.371] [2:1.846] [N/A:N/A] % Reduction in Area: [1:2.50%] [2:-59.90%] [N/A:N/A] % Reduction in Volume:  [1:35.00%] [2:-59.80%] [N/A:N/A] Classification: [1:Full Thickness Without Exposed Support Structures] [2:Full Thickness Without Exposed Support Structures] [N/A:N/A] Exudate Amount: [1:Large] [2:Large] [N/A:N/A] Exudate Type: [1:Purulent] [2:Purulent] [N/A:N/A] Exudate Color: [1:yellow, brown, green] [2:yellow, brown, green] [N/A:N/A] Wound Margin: [1:Distinct, outline attached] [2:Distinct, outline attached] [N/A:N/A] Granulation Amount: [1:None Present (0%)] [2:None Present (0%)] [N/A:N/A] Necrotic Amount: [1:Large (67-100%)] [2:Large (67-100%)] [N/A:N/A] Exposed Structures: [1:Fat Layer (Subcutaneous Tissue) Exposed: Yes Fascia: No Tendon: No Muscle: No Joint: No Bone: No] [2:Fat Layer (Subcutaneous Tissue) Exposed: Yes Fascia: No Tendon: No Muscle: No Joint: No Bone: No] [N/A:N/A] Epithelialization: [1:None] [2:None] [N/A:N/A] Periwound Skin Texture: [1:Scarring: Yes Excoriation: No Induration: No Callus: No] [2:Scarring: Yes Excoriation: No Induration: No Callus: No] [N/A:N/A] Crepitus: No Crepitus: No Rash: No Rash: No Periwound Skin Moisture: Maceration: Yes Maceration: Yes N/A Dry/Scaly: No Dry/Scaly: No Periwound Skin Color: Erythema: Yes Erythema: Yes N/A Hemosiderin Staining: Yes Hemosiderin Staining: Yes Atrophie Blanche: No Atrophie Blanche: No Cyanosis: No Cyanosis: No Ecchymosis: No Ecchymosis: No Mottled: No Mottled: No Pallor: No Pallor: No Rubor: No Rubor: No Erythema Location: Circumferential Circumferential N/A Temperature: No Abnormality No Abnormality N/A Tenderness on Palpation: Yes Yes N/A Wound Preparation: Ulcer Cleansing: Ulcer Cleansing: N/A Rinsed/Irrigated with Saline Rinsed/Irrigated with Saline Topical Anesthetic Applied: Topical Anesthetic Applied: Other: lidocaine 4% Other: lidocaine 4% Treatment Notes Electronic Signature(s) Signed: 09/10/2017 3:36:56 PM By: Alejandro Mulling Entered By: Alejandro Mulling on 09/10/2017  08:28:59 [name] ([id number]) -------------------------------------------------------------------------------- Multi-Disciplinary Care Plan Details Patient Name: [name] Date of Service: 09/10/2017 8:00 AM Medical Record Number: [id number] Patient Account Number: [id number] Date of Birth/Sex: 16-Mar-1946 (73 y.o. Male) Treating RN: [name] Primary Care Aishani Kalis: [name] Other Clinician: Referring Laurent Cargile: [name] Treating Omid Deardorff/Extender: STONE III, HOYT Weeks in Treatment: 86 Active Inactive ` Venous Leg Ulcer Nursing Diagnoses: Knowledge deficit related to disease process and management Goals: Patient will maintain optimal edema control Date Initiated: 04/03/2016 Target Resolution Date: 11/24/2016 Goal Status: Active Interventions: Compression as ordered Treatment Activities: Therapeutic compression applied : 04/03/2016 Notes: ` Wound/Skin Impairment Nursing Diagnoses: Impaired tissue integrity Goals: Ulcer/skin breakdown will heal within 14 weeks Date Initiated: 01/17/2016 Target Resolution Date: 11/24/2016 Goal Status: Active Interventions: Assess ulceration(s) every visit Notes: Electronic Signature(s) Signed: 09/10/2017 3:36:56 PM By: Alejandro Mulling Entered By: Alejandro Mulling on 09/10/2017 08:18:06 [name] ([id number]) -------------------------------------------------------------------------------- Pain Assessment Details Patient Name: [name] Date of Service: 09/10/2017 8:00 AM Medical Record Number: [id number] Patient Account Number: [id number] Date of Birth/Sex: Jan 16, 1946 (72 y.o. Male) Treating RN: [name] Primary Care Tashira Torre: [name] Other Clinician: Referring Albertus Chiarelli: [name] Treating Gwendy Boeder/Extender: STONE III, HOYT Weeks in Treatment: 86 Active Problems Location of Pain Severity and Description of Pain Patient Has Paino No Site Locations Pain Management and  Medication Current Pain  Management: Electronic Signature(s) Signed: 09/10/2017 3:36:56 PM By: Alejandro Mulling Entered By: Alejandro Mulling on 09/10/2017 08:06:56 [name] ([id number]) -------------------------------------------------------------------------------- Patient/Caregiver Education Details Patient Name: [name] Date of Service: 09/10/2017 8:00 AM Medical Record Number: [id number] Patient Account Number: [id number] Date of Birth/Gender: June 07, 1946 (72 y.o. Male) Treating RN: [name] Primary Care Physician: [name] Other Clinician: Referring Physician: [name] Treating Physician/Extender: Linwood Dibbles, HOYT Weeks in Treatment: 74 Education Assessment Education Provided To: Patient Education Topics Provided Wound/Skin Impairment: Handouts: Caring for Your Ulcer, Other: change dressing as ordered Methods: Demonstration, Explain/Verbal Responses: State content correctly Electronic Signature(s) Signed: 09/10/2017 3:36:56 PM By: Alejandro Mulling Entered By: Alejandro Mulling on 09/10/2017 08:37:39 [name] ([id number]) -------------------------------------------------------------------------------- Wound Assessment Details Patient Name: [name] Date of Service: 09/10/2017 8:00 AM Medical Record Number: [id number] Patient Account Number: [id number] Date of Birth/Sex: 19-Dec-1945 (72 y.o. Male) Treating RN: Ashok Cordia, [name] Primary Care Lani Havlik: [name] Other Clinician: Referring Malichi Palardy: [name] Treating Sheryle Vice/Extender: STONE III, HOYT Weeks in Treatment: 86 Wound Status Wound Number: 1 Primary Venous Leg Ulcer Etiology: Wound Location: Right Malleolus - Lateral Wound Open Wounding Event: Gradually Appeared Status: Date Acquired: 08/11/2015 Comorbid Cataracts, Chronic Obstructive Pulmonary Weeks Of Treatment: 86 History: Disease (COPD), Osteoarthritis Clustered Wound: No Photos Photo Uploaded  By: Alejandro Mulling on 09/10/2017 14:44:35 Wound Measurements Length: (cm) 5.8 Width: (cm) 3.7 Depth: (cm) 0.2 Area: (cm) 16.855 Volume: (cm) 3.371 % Reduction in Area: 2.5% % Reduction in Volume: 35% Epithelialization: None Tunneling: No Wound Description Full Thickness Without Exposed Support Classification: Structures Wound Margin: Distinct, outline attached Exudate Large Amount: Exudate Type: Purulent Exudate Color: yellow, brown, green Foul Odor After Cleansing: No Slough/Fibrino Yes Wound Bed Granulation Amount: None Present (0%) Exposed Structure Necrotic Amount: Large (67-100%) Fascia Exposed: No Necrotic Quality: Adherent Slough Fat Layer (Subcutaneous Tissue) Exposed: Yes Tendon Exposed: No Muscle Exposed: No Joint Exposed: No Bone Exposed: No Loving, Hezakiah C. ([id number]) Periwound Skin Texture Texture Color No Abnormalities Noted: No No Abnormalities Noted: No Callus: No Atrophie Blanche: No Crepitus: No Cyanosis: No Excoriation: No Ecchymosis: No Induration: No Erythema: Yes Rash: No Erythema Location: Circumferential Scarring: Yes Hemosiderin Staining: Yes Mottled: No Moisture Pallor: No No Abnormalities Noted: No Rubor: No Dry / Scaly: No Maceration: Yes Temperature / Pain Temperature: No Abnormality Tenderness on Palpation: Yes Wound Preparation Ulcer Cleansing: Rinsed/Irrigated with Saline Topical Anesthetic Applied: Other: lidocaine 4%, Treatment Notes Wound #1 (Right, Lateral Malleolus) 1. Cleansed with: Clean wound with Normal Saline 2. Anesthetic Topical Lidocaine 4% cream to wound bed prior to debridement 3. Peri-wound Care: Barrier cream 4. Dressing Applied: Other dressing (specify in notes) 5. Secondary Dressing Applied ABD Pad Dry Gauze Kerlix/Conform Notes xtrasorb, Nurse, mental health Signature(s) Signed: 09/10/2017 3:36:56 PM By: Alejandro Mulling Entered By: Alejandro Mulling on 09/10/2017  08:16:04 [name] ([id number]) -------------------------------------------------------------------------------- Wound Assessment Details Patient Name: [name] Date of Service: 09/10/2017 8:00 AM Medical Record Number: [id number] Patient Account Number: [id number] Date of Birth/Sex: 1946/07/18 (72 y.o. Male) Treating RN: [name] Primary Care Morrisa Aldaba: [name] Other Clinician: Referring Chevon Laufer: [name] Treating Ketra Duchesne/Extender: STONE III, HOYT Weeks in Treatment: 86 Wound Status Wound Number: 2 Primary Venous Leg Ulcer Etiology: Wound Location: Right Malleolus - Medial Wound Open Wounding Event: Gradually Appeared Status: Date Acquired: 01/30/2016 Comorbid Cataracts, Chronic Obstructive Pulmonary Weeks Of Treatment: 84 History: Disease (COPD), Osteoarthritis Clustered Wound: Yes Photos Photo Uploaded By: Alejandro Mulling on 09/10/2017 14:44:35 Wound Measurements Length: (cm) 4.7 Width: (cm)  5 Depth: (cm) 0.1 Area: (cm) 18.457 Volume: (cm) 1.846 % Reduction in Area: -59.9% % Reduction in Volume: -59.8% Epithelialization: None Tunneling: No Undermining: No Wound Description Full Thickness Without Exposed Support Classification: Structures Wound Margin: Distinct, outline attached Exudate Large Amount: Exudate Type: Purulent Exudate Color: yellow, brown, green Foul Odor After Cleansing: No Slough/Fibrino Yes Wound Bed Granulation Amount: None Present (0%) Exposed Structure Necrotic Amount: Large (67-100%) Fascia Exposed: No Necrotic Quality: Adherent Slough Fat Layer (Subcutaneous Tissue) Exposed: Yes Tendon Exposed: No Muscle Exposed: No Joint Exposed: No Bone Exposed: No Danielsen, Quantez C. ([id number]) Periwound Skin Texture Texture Color No Abnormalities Noted: No No Abnormalities Noted: No Callus: No Atrophie Blanche: No Crepitus: No Cyanosis: No Excoriation: No Ecchymosis: No Induration: No Erythema:  Yes Rash: No Erythema Location: Circumferential Scarring: Yes Hemosiderin Staining: Yes Mottled: No Moisture Pallor: No No Abnormalities Noted: No Rubor: No Dry / Scaly: No Maceration: Yes Temperature / Pain Temperature: No Abnormality Tenderness on Palpation: Yes Wound Preparation Ulcer Cleansing: Rinsed/Irrigated with Saline Topical Anesthetic Applied: Other: lidocaine 4%, Treatment Notes Wound #2 (Right, Medial Malleolus) 1. Cleansed with: Clean wound with Normal Saline 2. Anesthetic Topical Lidocaine 4% cream to wound bed prior to debridement 3. Peri-wound Care: Barrier cream 4. Dressing Applied: Other dressing (specify in notes) 5. Secondary Dressing Applied ABD Pad Dry Gauze Kerlix/Conform Notes xtrasorb, Nurse, mental healthDakins Solution Electronic Signature(s) Signed: 09/10/2017 3:36:56 PM By: Alejandro MullingPinkerton, Debra Entered By: Alejandro MullingPinkerton, Debra on 09/10/2017 08:16:21 [name] StackBLACKWELL, Eh C. ([id number]) -------------------------------------------------------------------------------- Vitals Details Patient Name: [name] StackBLACKWELL, Theophil C. Date of Service: 09/10/2017 8:00 AM Medical Record Number: [id number] Patient Account Number: [id number] Date of Birth/Sex: 01-02-1946 [age] y.o. Male) Treating RN: Ashok CordiaPinkerton, [name] Primary Care Raif Chachere: Darreld McleanMILES, LINDA Other Clinician: Referring Nate Perri: Darreld McleanMILES, LINDA Treating Milica Gully/Extender: STONE III, HOYT Weeks in Treatment: 86 Vital Signs Time Taken: 08:07 Temperature (F): 98.1 Height (in): 76 Pulse (bpm): 87 Weight (lbs): 238 Respiratory Rate (breaths/min): 18 Body Mass Index (BMI): 29 Blood Pressure (mmHg): 123/63 Reference Range: 80 - 120 mg / dl Electronic Signature(s) Signed: 09/10/2017 3:36:56 PM By: Alejandro MullingPinkerton, Debra Entered By: Alejandro MullingPinkerton, Debra on 09/10/2017 08:09:38

## 2017-09-11 NOTE — Progress Notes (Addendum)
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 voicing no complaints or concerns. He has been tolerating his twice weekly compression therapy  changes 12/11/16 Patient complains of pain and increased drainage.. wants hydrofera blue 12/18/16 improvement. Sorbact 12/25/16; medial wound is smaller, lateral measures the same. Still on sorbact 01/01/17; medial wound continues to be smaller, lateral measures about the same however there is clearly advancing epithelialization here as well in fact I think the wound will ultimately divided into 2 open areas 01/08/17; unfortunately today fairly significant regression in several areas. Surface of the lateral wound covered again in adherent necrotic material which is difficult to debridement. He has significant surrounding skin maceration. The expanding area of tissue epithelialization in the middle of the wound that was encouraging last week appears to be smaller. There is no surrounding tenderness. The area on the medial leg also did not seem to be as healthy as last week, the reason for this regression this week is not totally clear. We have been using Sorbact for the last 4 weeks. We'll switched of polymen AG which we will order via home medical supply. If there is a problem with this would switch back to Iodoflex 01/15/18; drainage,odor. No change. Switched to polymen last week 01/22/17; still continuous drainage. Culture I did last week showed a few Proteus pansensitive. I did this culture because of drainage. Put him on Augmentin which she has been taking since Saturday however he is developed 4-5 liquid bowel movements. He is also on Coumadin. Beyond this wound is not changed at all, still nonviable necrotic surface material which I debrided reveals healthy granulation line 01/29/17; still copious amounts of drainage reported by her intake nurse. Wound measuring slightly smaller. Currently fact on Iodoflex although I'm looking forward to changing back to [facility] polymen [facility] 02/05/17; still large amounts of drainage and presenting with really large amounts of adherent slough and necrotic  material over the remaining open area of the wound. We have been using Iodoflex but with little improvement in the surface. [facility] 02/12[facility]17; still large amount of drainage. Much less adherent slough however. Started [employer]	[place] [place] last week [name] StackBLACKWELL, Dempsey C. ([id number]) 02/19/17; drainage is better this week. Much less adherent slough. Perhaps some improvement in dimensions. [facility] 02/[facility]17; severe venous insufficiency wounds on the right lateral and right medial leg. Drainage is some better and slough is less adherent we've been using Hydrofera Blue 03/05/17 on evaluation today patient appears to be doing well. His wounds have been decreasing in size and overall he is pleased with how this is progressing. We are awaiting approval for the epigraph which has previously been recommended in the meantime the [facility] is to be doing very well for him. 03/12/17; wound dimensions are smaller still using Hydrofera Blue. Comes in on Fridays for a dressing change. 03/19/17; wound dimensions continued to contract. Healing of this wound is complicated by continuous significant drainage as well as recurrent buildup of necrotic surface material. We looked into Apligraf and he has a $290 co-pay per application but truthfully I think the drainage as well as the nonviable surface would preclude use of Apligraf there are any other skin substitute at this point therefore the continued plan will be debridement each clinic visit, 2 times a week dressing changes and continued use of Hydrofera Blue. improvement has been very slow but sustained 03/26/17 perhaps slight  improvements in peripheral epithelialization is especially inferiorly. Still with large amount of drainage and tightly adherent necrotic surface on arrival. Along with the intake nurse I reviewed previous treatment. He worsened on Iodoflex and had a 4 week trial of sorbact, Polymen AG and a long courses of Aquacel  Ag. He is not a candidate for advanced treatment options for many reasons 04/09/17 on evaluation today patient's right lower extremity wounds appear to be doing a little better. Fortunately he has no significant discomfort and has been tolerating the dressing changes including the wraps without complication. With that being said he really does not have swelling anymore compared to what he has had in the past following his vascular intervention. He wonders if potentially we could attempt avoiding the rats to see if he could cleanse the wound in between to try to prevent some of the fiber and its buildup that occurs in the interim between when we see him week to week. No fevers, chills, nausea, or vomiting noted at this time. 04/16/17; noted that the staff made a choice last week not to put him in compression. The patient is changing his dressing at home using [facility] and changing every second day. His wife is washing the wound with saline. He is using kerlix. Surprisingly he has not developed a lot of edema. This choice was made because of the degree of fluid retention and maceration even with changing his dressing twice a week 04/23/17; absolutely no change. Using Hydrofera Blue. Recurrent tightly adherent nonviable surface material. This is been refractory to Iodoflex, sorbact and now Hydrofera Blue. More recently he has been changing his own dressings at home, cleansing the wound in the shower. He has not developed lower extremity edema 04/30/17; if anything the wound is larger still in adherent surface although it debrided easily today. I've been using Mehta honey for 1 week and I'd like to try and do it for a second week this see if we can get some form of viable surface here. 05/07/17; patient arrives with copious amounts of drainage and some pain in the superior part of the wound. He has not been systemically unwell. He's been changing this daily at home 05/14/17; patient arrives today  complaining of less drainage and less pain. Dimensions slightly better. Surface culture I did of this last week grew MSSA I put him on dicloxacillin for 10 days. Patient requesting a further prescription since he feels so much better. He did not obtain the [facility] AG for reasons that aren't clear, he has been using Hydrofera Blue 05/21/17; perhaps some less drainage and less pain. He is completing another week of doxycycline. Unfortunately there is no change in the wound measurements are appearance still tightly adherent necrotic surface material that is really defied treatment. We have been using Hydrofera Blue most recently. He did not manage to get Anasept. He was told it was prescription at [facility] 05/29/17; absolutely no improvement in these wound areas. He had remote plastic surgery with who was done at [facility] in [place] surrounding this area. This was a traumatic wound with extensive plastic surgery/skin grafting at that time. I switched him to sorbact last week to see if he can do anything about the recurrent necrotic surface and drainage. This is largely been refractory to Iodoflex/Hydrofera Blue/alginates. I believe we tried Elby Showers and more recently Medi honey l however the drainage is really too excessive 06/04/17; patient went to see Dr. Ulice Bold. Per the patient she ordered him a compression stocking. Continued  the Anasept gel and sorbact was already on. No major improvement in the condition of the wounds in fact the medial wound looks larger. Again per the patient she did not feel a skin graft or operative debridement was indicated The patient had previous venous ablation in February 2018. Last arterial studies were in December 2017 and were within normal limits 06/18/17; patient arrives back in clinic today using Anasept gel with sorbact. He changes this every day is wearing his own compression stocking. The patient actually saw Dr. Leta Baptist of plastic surgery. I've  reviewed her note. The appointment was on 05/30/17. The basic issue with here was that she did not feel that skin grafts for patients with underlying stasis and edema have a good long-term success rate. She also discuss skin substitutes which has been done here as well however I have not been able to get the surface of these wounds to something that I think would support skin substitutes. This is why he felt he might need an MICHAELJOSEPH, REVOLORIO. ([id number]) operative debridement more aggressively than I can do in this clinic Review of systems; is otherwise negative he feels well 07/02/17; patient has been using Anasept gel with Sorbact. He changes this every day scrubs out the wound beds. Arrives today with the wound bed looking some better. Easier debridement 07/16/17; patient has been using Anasept gel was sorbact for about a month now. The necrotic service on his wound bed is better and the drainage is now down to minimal but we've not made any improvements in the epithelialization. Change to Santyl today. Surface of the wound is still not good enough to support skin substitutes 07/30/17 on evaluation today patient appears to be doing very well in regard to his right bimalleolus wounds. He has been tolerating the treatment with the Anasept gel and sorbact. However we were going to try and switch to Santyl to be more aggressive with these wounds. This was however cost prohibitive. Therefore we will likely go back to the sorbact. Nonetheless he is not having any significant discomfort he still is forming slough over the wound location but nothing too significant. The wounds do appear to be filling in nicely. 08/13/17; I have not seen this wound in about a month. There is not much change. The fact the area medially looks larger. He has been using sorbact and Anasept for over a month now without much effect. Arrives in clinic stating that he does not want the area debrided 08/28/17; patient arrives  with the areas on his right medial and right lateral ankle worse. The wounds of expanded there is erythema and still drainage. There is no pain and no tenderness. We had been using Keracel AG clearly not doing well. 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 He states that even the evening that we began utilizing the Iodoflex he started having burning. Subsequently this never really improved and he eventually discontinue the use of the Iodoflex altogether. Unfortunately he did not let us know about any of this until today. I'm unsure of any specific infection at this point although I am going to obtain a wound culture today in order to see if there's anything that could potentially be causing an infection  as well. It does sound as if he's had the Iodoflex before and did not have this kind of reaction although this does sound very specific for a reaction to the Iodoflex. Patient History Information obtained from Patient. Allergies iodine Social History Former smoker - 10 years ago, Marital Status - Married, Alcohol Use - Moderate, Drug Use - No History, Caffeine Use - Never. Medical And Surgical History Notes Constitutional Symptoms (General Health) Vein Filter (groin area); CODA; H/O Blood Clots; pulmonary hypertensive arterial disease Review of Systems (ROS) Constitutional Symptoms (General Health) Denies complaints or symptoms of Fever, Chills. Respiratory The patient has no complaints or symptoms. Cardiovascular Complains or has symptoms of LE edema. Psychiatric The patient has no complaints or symptoms. DAEQUAN, KOZMA ([id number]) Objective Constitutional Well-nourished and well-hydrated in no acute  distress. Vitals Time Taken: 8:07 AM, Height: 76 in, Weight: 238 lbs, BMI: 29, Temperature: 98.1 F, Pulse: 87 bpm, Respiratory Rate: 18 breaths/min, Blood Pressure: 123/63 mmHg. Respiratory normal breathing without difficulty. clear to auscultation bilaterally. Cardiovascular regular rate and rhythm with normal S1, S2. 1+ pitting edema of the bilateral lower extremities. Psychiatric this patient is able to make decisions and demonstrates good insight into disease process. Alert and Oriented x 3. pleasant and cooperative. General Notes: Patient's skin surrounding the wound in the periwound region appears to be erythematous there does not appear to be any evidence of significant purulent discharge other than in regard to the lateral malleolus ulcer which was culture today. We will see what that shows. Otherwise no sharp debridement was performed today due to the fact that patient may have an infection and I did not want to risk spreading this any additional and further. He was appreciated above this. Integumentary (Hair, Skin) Wound #1 status is Open. Original cause of wound was Gradually Appeared. The wound is located on the Right,Lateral Malleolus. The wound measures 5.8cm length x 3.7cm width x 0.2cm depth; 16.855cm^2 area and 3.371cm^3 volume. There is Fat Layer (Subcutaneous Tissue) Exposed exposed. There is no tunneling noted. There is a large amount of purulent drainage noted. The wound margin is distinct with the outline attached to the wound base. There is no granulation within the wound bed. There is a large (67-100%) amount of necrotic tissue within the wound bed including Adherent Slough. The periwound skin appearance exhibited: Scarring, Maceration, Hemosiderin Staining, Erythema. The periwound skin appearance did not exhibit: Callus, Crepitus, Excoriation, Induration, Rash, Dry/Scaly, Atrophie Blanche, Cyanosis, Ecchymosis, Mottled, Pallor, Rubor. The surrounding wound skin color  is noted with erythema which is circumferential. Periwound temperature was noted as No Abnormality. The periwound has tenderness on palpation. Wound #2 status is Open. Original cause of wound was Gradually Appeared. The wound is located on the Right,Medial Malleolus. The wound measures 4.7cm length x 5cm width x 0.1cm depth; 18.457cm^2 area and 1.846cm^3 volume. There is Fat Layer (Subcutaneous Tissue) Exposed exposed. There is no tunneling or undermining noted. There is a large amount of purulent drainage noted. The wound margin is distinct with the outline attached to the wound base. There is no granulation within the wound bed. There is a large (67-100%) amount of necrotic tissue within the wound bed including Adherent Slough. The periwound skin appearance exhibited: Scarring, Maceration, Hemosiderin Staining, Erythema. The periwound skin appearance did not exhibit: Callus, Crepitus, Excoriation, Induration, Rash, Dry/Scaly, Atrophie Blanche, Cyanosis, Ecchymosis, Mottled, Pallor, Rubor. The surrounding wound skin color is noted with erythema which is circumferential. Periwound temperature was noted as No Abnormality. The periwound has tenderness  on palpation. 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 (idiopathic) with ulcer and inflammation of right lower extremity Crossin, Jarome C. 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 Cleanse wound with mild soap and  water May Shower, gently pat wound dry prior to applying new dressing. Wound #2 Right,Medial Malleolus: Clean wound with Normal Saline. Cleanse wound with mild soap and water May Shower, gently pat wound dry prior to applying new dressing. Anesthetic (add to Medication List): Wound #1 Right,Lateral Malleolus: Topical Lidocaine 4% cream applied to wound bed prior to debridement (In Clinic Only). Wound #2 Right,Medial Malleolus: Topical Lidocaine 4% cream applied to wound bed prior to debridement (In Clinic Only). Skin Barriers/Peri-Wound Care: Wound #1 Right,Lateral Malleolus: Barrier cream Wound #2 Right,Medial Malleolus: Barrier cream Primary Wound Dressing: Wound #1 Right,Lateral Malleolus: Other: - Dakins Solution Wound #2 Right,Medial Malleolus: Other: - Dakins Solution Secondary Dressing: Wound #1 Right,Lateral Malleolus: ABD pad Conform/Kerlix XtraSorb Wound #2 Right,Medial Malleolus: ABD pad Conform/Kerlix XtraSorb Dressing Change Frequency: Wound #1 Right,Lateral Malleolus: Change dressing every other day. Wound #2 Right,Medial Malleolus: Change dressing every other day. Follow-up Appointments: Return Appointment in 2 weeks. Edema Control: Wound #1 Right,Lateral Malleolus: Elevate legs to the level of the heart and pump ankles as often as possible Wound #2 Right,Medial Malleolus: Elevate legs to the level of the heart and pump ankles as often as possible Additional Orders / InstructionsJAQWON, MANFRED ([id number]) Wound #1 Right,Lateral Malleolus: Increase protein intake. Wound #2 Right,Medial Malleolus: Increase protein intake. Medications-please add to medication list.: Wound #1 Right,Lateral Malleolus: Other: - Vitamin C, Zinc, MVI Wound #2 Right,Medial Malleolus: Other: - Vitamin C, Zinc, MVI Laboratory ordered were: Wound culture routine - right lateral The following medication(s) was prescribed: lidocaine topical 4 % cream 1 1 cream topical  was prescribed at facility We will actually initiate treatment with Dakin solution 0.25% to the wound bed per above. Hopefully this will help to clean up the wound beds along with helping to manage the distal drainage due to the fact that he will be changing this daily. It can actually even be changed more often if it becomes necessary. We will also send the wound culture for evaluation to see if there's anything that shows and initiate treatment as needed if indeed there appears to be an infection. Please see above for specific wound care orders. We will see patient for re-evaluation in 1 week(s) here in the clinic. If anything worsens or changes patient will contact our office for additional recommendations. Electronic Signature(s) Signed: 09/30/2017 4:50:01 PM By: Lenda Kelp PA-C Previous Signature: 09/10/2017 4:32:07 PM Version By: Lenda Kelp PA-C Entered By: Lenda Kelp on 09/30/2017 09:00:43 [name] ([id number]) -------------------------------------------------------------------------------- ROS/PFSH Details Patient Name: [name] Date of Service: 09/10/2017 8:00 AM Medical Record Number: [id number] Patient Account Number: [id number] Date of Birth/Sex: 1946-07-09 (72 y.o. Male) Treating RN: Ashok Cordia, [name] Primary Care Provider: [name] Other Clinician: Referring Provider: [name] Treating Provider/Extender: STONE III, HOYT Weeks in Treatment: 24 Information Obtained From Patient Wound History Do you currently have one or more  open woundso Yes How many open wounds do you currently haveo 1 Approximately how long have you had your woundso 5 months How have you been treating your wound(s) until nowo saline, dressing Has your wound(s) ever healed and then re-openedo Yes Have you had any lab work done in the past montho No Have you tested positive for an antibiotic resistant organism (MRSA, VRE)o No Have you tested positive for osteomyelitis  (bone infection)o No Have you had any tests for circulation on your legso No Constitutional Symptoms (General Health) Complaints and Symptoms: Negative for: Fever; Chills Medical History: Past Medical History Notes: Vein Filter (groin area); CODA; H/O Blood Clots; pulmonary hypertensive arterial disease Cardiovascular Complaints and Symptoms: Positive for: LE edema Medical History: Negative for: Angina; Arrhythmia; Congestive Heart Failure; Coronary Artery Disease; Deep Vein Thrombosis; Hypertension; Hypotension; Myocardial Infarction; Peripheral Arterial Disease; Peripheral Venous Disease; Phlebitis; Vasculitis Eyes Medical History: Positive for: Cataracts - removed Negative for: Glaucoma; Optic Neuritis Ear/Nose/Mouth/Throat Medical History: Negative for: Chronic sinus problems/congestion Hematologic/Lymphatic Medical History: Negative for: Anemia; Hemophilia; Human Immunodeficiency Virus; Lymphedema; Sickle Cell Disease Respiratory Michaelis, Wang C. ([id number]) Complaints and Symptoms: No Complaints or Symptoms Medical History: Positive for: Chronic Obstructive Pulmonary Disease (COPD) Negative for: Aspiration; Asthma; Pneumothorax; Sleep Apnea; Tuberculosis Gastrointestinal Medical History: Negative for: Cirrhosis ; Colitis; Crohnos; Hepatitis A; Hepatitis B; Hepatitis C Endocrine Medical History: Negative for: Type I Diabetes; Type II Diabetes Genitourinary Medical History: Negative for: End Stage Renal Disease Immunological Medical History: Negative for: Lupus Erythematosus; Raynaudos Integumentary (Skin) Medical History: Negative for: History of Burn; History of pressure wounds Musculoskeletal Medical History: Positive for: Osteoarthritis Negative for: Gout; Rheumatoid Arthritis; Osteomyelitis Neurologic Medical History: Negative for: Dementia; Neuropathy; Quadriplegia; Paraplegia; Seizure Disorder Oncologic Medical History: Negative for: Received  Chemotherapy; Received Radiation Psychiatric Complaints and Symptoms: No Complaints or Symptoms Medical History: Negative for: Anorexia/bulimia; Confinement Anxiety HBO Extended History Items Eyes: Cataracts Schnyder, Shizuo C. ([id number]) Immunizations Pneumococcal Vaccine: Received Pneumococcal Vaccination: No Implantable Devices Family and Social History Former smoker - 10 years ago; Marital Status - Married; Alcohol Use: Moderate; Drug Use: No History; Caffeine Use: Never; Advanced Directives: No; Patient does not want information on Advanced Directives; Living Will: No; Medical Power of Attorney: No Physician Affirmation I have reviewed and agree with the above information. Electronic Signature(s) Signed: 09/10/2017 3:36:56 PM By: Alejandro Mulling Signed: 09/10/2017 4:32:07 PM By: Lenda Kelp PA-C Entered By: Lenda Kelp on 09/10/2017 14:48:13 [name] ([id number]) -------------------------------------------------------------------------------- SuperBill Details Patient Name: [name] Date of Service: 09/10/2017 Medical Record Number: [id number] Patient Account Number: [id number] Date of Birth/Sex: [birth year] (72 y.o. 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, initial encounter T81.31XD Disruption of external operation (surgical) wound, not elsewhere classified, subsequent encounter L97.211 Non-pressure chronic ulcer of right  calf limited to breakdown of skin Facility Procedures CPT4 Code: [id number] Description: [id number] - WOUND CARE VISIT-LEV 3 EST PT Modifier: Quantity: 1 Physician Procedures CPT4: Description Modifier Quantity Code [id number] - WC PHYS LEVEL 3 - EST PT 1 ICD-10 Diagnosis Description I87.331 Chronic venous hypertension (idiopathic) with ulcer and inflammation of right  lower extremity L97.212 Non-pressure chronic ulcer of  right calf with fat layer exposed T85.613A 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

## 2017-09-12 LAB — AEROBIC CULTURE  (SUPERFICIAL SPECIMEN): GRAM STAIN: NONE SEEN

## 2017-09-12 LAB — AEROBIC CULTURE W GRAM STAIN (SUPERFICIAL SPECIMEN)

## 2017-09-17 ENCOUNTER — Encounter: Payer: Medicare HMO | Admitting: Physician Assistant

## 2017-09-17 DIAGNOSIS — I87331 Chronic venous hypertension (idiopathic) with ulcer and inflammation of right lower extremity: Secondary | ICD-10-CM | POA: Diagnosis not present

## 2017-09-18 NOTE — Progress Notes (Signed)
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 believe he is had plastic surgery on this foot in the past. He had an injury to the foot in the 1980s. 1/16 /18 right lateral greater than right medial ankle wounds on the right in the setting of previous skin grafting. 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 He does not have arterial issues. Each week he comes in with the same adherent surface slough although there was less of this today 09/18/16; right lateral greater than right medial lower extremity wounds in the setting of previous skin grafting and trauma. He has least to vein laser ablation scheduled for February 2 for venous reflux. He does not have significant arterial disease. Problem has been very difficult to handle surface slough/necrotic tissue. Recently using Iodoflex for this with some, albeit slow improvement 09/25/16; right lateral greater than right medial lower extremity venous wounds in the setting of previous skin grafting. He is going for ablation surgery on February 2 after this he'll come back here for rewrap. He has been using Iodoflex as the primary dressing. 10/02/16; right lateral greater than right medial lower extremity wounds in the setting of previous skin grafting. He had his ablation surgery last week, I don't have a report. He tolerated this well. Came in with a thigh-high Unna boots on Friday. We have been using Iodoflex as the primary dressing. His measurements are improving 10/09/16; continues to make nice aggressive in terms of the wounds on his lateral and medial right ankle in the setting of previous skin grafting. Yesterday he noticed drainage at one of his surgical sites from his venous ablation on the right calf. He took off the bandage over this area felt a "popping" sensation and a reddish-brown drainage. He is not complaining of any pain 10/16/16; he continues to make nice progression in  terms of the wounds on the lateral and medial malleolus. Both smaller using Iodoflex. He had a surgical area in his posterior mid calf we have been using iodoform. All the wounds are down and dimensions 10/23/16; the patient arrives today with no complaints. He states the Iodoflex is a bit uncomfortable. He is not systemically unwell. We have been using Iodoflex to the lateral right ankle and the medial and Aquacel Ag to the reflux surgical wound on the posterior right calf. All of these wounds are doing well 10/30/16; patient states he has no pain no systemic symptoms. I changed him to [facility] last week. Although the wounds are doing well 11/06/16; patient reports no pain or systemic symptoms. We continue with Hydrofera Blue. Both wound areas on the medial and lateral ankle appear to be doing well with improvement and dimensions and improvement in the wound bed. 11/13/16; patient's dimensions continued to improve. We continue with Hydrofera Blue on the medial and lateral side. Appear to be doing well with healthy granulation and advancing epithelialization 11/20/16; patient's dimensions improving laterally by about half a centimeter in length. Otherwise no change on the medial side. Using [facility] 12/04/16; no major change in patient's wound dimensions. Intake nurse reports more drainage. 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 He is voicing no complaints or concerns. He has been tolerating his twice weekly compression therapy changes 12/11/16 Patient complains of pain and increased drainage.. wants hydrofera blue 12/18/16 improvement. Sorbact 12/25/16; medial wound is smaller, lateral measures the same. Still on sorbact 01/01/17; medial wound continues to be smaller, lateral measures about the same however there is clearly advancing epithelialization here as well in fact I think the  wound will ultimately divided into 2 open areas 01/08/17; unfortunately today fairly significant regression in several areas. Surface of the lateral wound  covered again in adherent necrotic material which is difficult to debridement. He has significant surrounding skin maceration. The expanding area of tissue epithelialization in the middle of the wound that was encouraging last week appears to be smaller. There is no surrounding tenderness. The area on the medial leg also did not seem to be as healthy as last week, the reason for this regression this week is not totally clear. We have been using Sorbact for the last 4 weeks. We'll switched of polymen AG which we will order via home medical supply. If there is a problem with this would switch back to Iodoflex 01/15/18; drainage,odor. No change. Switched to polymen last week 01/22/17; still continuous drainage. Culture I did last week showed a few Proteus pansensitive. I did this culture because of drainage. Put him on Augmentin which she has been taking since Saturday however he is developed 4-5 liquid bowel movements. He is also on Coumadin. Beyond this wound is not changed at all, still nonviable necrotic surface material which I debrided reveals healthy granulation line 01/29/17; still copious amounts of drainage reported by her intake nurse. Wound measuring slightly smaller. Currently fact on Iodoflex although I'm looking forward to changing back to perhaps [facility] or polymen AG 02/05/17; still large amounts of drainage and presenting with really large amounts of adherent slough and necrotic material over the remaining open area of the wound. We have been using Iodoflex but with little improvement in the surface. Change to [facility] 02/12/17; still large amount of drainage. Much less adherent slough however. Started [employer]	[place] last week LUCIOUS, ZOU ([id number]) 02/19/17; drainage is better this week. Much less adherent slough.  Perhaps some improvement in dimensions. Using [facility] 02/26/17; severe venous insufficiency wounds on the right lateral and right medial leg. Drainage is some better and slough is less adherent we've been using Hydrofera Blue 03/05/17 on evaluation today patient appears to be doing well. His wounds have been decreasing in size and overall he is pleased with how this is progressing. We are awaiting approval for the epigraph which has previously been recommended in the meantime the [facility] Dressing is to be doing very well for him. 03/12/17; wound dimensions are smaller still using Hydrofera Blue. Comes in on Fridays for a dressing change. 03/19/17; wound dimensions continued to contract. Healing of this wound is complicated by continuous significant drainage as well as recurrent buildup of necrotic surface material. We looked into Apligraf and he has a $290 co-pay per application but truthfully I think the drainage as well as the nonviable surface would preclude use of Apligraf there are any other skin substitute at this point therefore the continued plan will be debridement each clinic visit, 2 times a week dressing changes and continued use of Hydrofera Blue. improvement has been very slow but sustained 03/26/17 perhaps slight improvements in peripheral epithelialization is especially inferiorly. Still with large amount of drainage and tightly adherent necrotic surface on arrival. Along with the intake nurse I reviewed previous treatment. He worsened on Iodoflex and had a 4 week trial of sorbact, Polymen AG and a long courses of Aquacel Ag. He is not a candidate for advanced treatment options for many reasons 04/09/17 on evaluation today patient's right lower extremity wounds appear to be doing a little better. Fortunately he has no significant discomfort and has been tolerating the dressing changes including the wraps without complication. With that being said he really does not have  swelling anymore compared to what he has  had in the past following his vascular intervention. He wonders if potentially we could attempt avoiding the rats to see if he could cleanse the wound in between to try to prevent some of the fiber and its buildup that occurs in the interim between when we see him week to week. No fevers, chills, nausea, or vomiting noted at this time. 04/16/17; noted that the staff made a choice last week not to put him in compression. The patient is changing his dressing at home using [facility] and changing every second day. His wife is washing the wound with saline. He is using kerlix. Surprisingly he has not developed a lot of edema. This choice was made because of the degree of fluid retention and maceration even with changing his dressing twice a week 04/23/17; absolutely no change. Using Hydrofera Blue. Recurrent tightly adherent nonviable surface material. This is been refractory to Iodoflex, sorbact and now Hydrofera Blue. More recently he has been changing his own dressings at home, cleansing the wound in the shower. He has not developed lower extremity edema 04/30/17; if anything the wound is larger still in adherent surface although it debrided easily today. I've been using Mehta honey for 1 week and I'd like to try and do it for a second week this see if we can get some form of viable surface here. 05/07/17; patient arrives with copious amounts of drainage and some pain in the superior part of the wound. He has not been systemically unwell. He's been changing this daily at home 05/14/17; patient arrives today complaining of less drainage and less pain. Dimensions slightly better. Surface culture I did of this last week grew MSSA I put him on dicloxacillin for 10 days. Patient requesting a further prescription since he feels so much better. He did not obtain the [facility] AG for reasons that aren't clear, he has been using Hydrofera Blue 05/21/17; perhaps some less  drainage and less pain. He is completing another week of doxycycline. Unfortunately there is no change in the wound measurements are appearance still tightly adherent necrotic surface material that is really defied treatment. We have been using Hydrofera Blue most recently. He did not manage to get Anasept. He was told it was prescription at [facility] 05/29/17; absolutely no improvement in these wound areas. He had remote plastic surgery with who was done at [facility] in [place] surrounding this area. This was a traumatic wound with extensive plastic surgery/skin grafting at that time. I switched him to sorbact last week to see if he can do anything about the recurrent necrotic surface and drainage. This is largely been refractory to Iodoflex/Hydrofera Blue/alginates. I believe we tried Elby Showers and more recently Medi honey l however the drainage is really too excessive 06/04/17; patient went to see Dr. Ulice Bold. Per the patient she ordered him a compression stocking. Continued the Anasept gel and sorbact was already on. No major improvement in the condition of the wounds in fact the medial wound looks larger. Again per the patient she did not feel a skin graft or operative debridement was indicated The patient had previous venous ablation in February 2018. Last arterial studies were in December 2017 and were within normal limits 06/18/17; patient arrives back in clinic today using Anasept gel with sorbact. He changes this every day is wearing his own compression stocking. The patient actually saw Dr. Leta Baptist of plastic surgery. I've reviewed her note. The appointment was on 05/30/17. The basic issue with here was that she did not feel  that skin grafts for patients with underlying stasis and edema have a good long-term success rate. She also discuss skin substitutes which has been done here as well however I have not been able to get the surface of these wounds to something that I think would  support skin substitutes. This is why he felt he might need an [name] StackBLACKWELL, Delma C. ([id number]) operative debridement more aggressively than I can do in this clinic Review of systems; is otherwise negative he feels well 07/02/17; patient has been using Anasept gel with Sorbact. He changes this every day scrubs out the wound beds. Arrives today with the wound bed looking some better. Easier debridement 07/16/17; patient has been using Anasept gel was sorbact for about a month now. The necrotic service on his wound bed is better and the drainage is now down to minimal but we've not made any improvements in the epithelialization. Change to Santyl today. Surface of the wound is still not good enough to support skin substitutes 07/30/17 on evaluation today patient appears to be doing very well in regard to his right bimalleolus wounds. He has been tolerating the treatment with the Anasept gel and sorbact. However we were going to try and switch to Santyl to be more aggressive with these wounds. This was however cost prohibitive. Therefore we will likely go back to the sorbact. Nonetheless he is not having any significant discomfort he still is forming slough over the wound location but nothing too significant. The wounds do appear to be filling in nicely. 08/13/17; I have not seen this wound in about a month. There is not much change. The fact the area medially looks larger. He has been using sorbact and Anasept for over a month now without much effect. Arrives in clinic stating that he does not want the area debrided 08/28/17; patient arrives with the areas on his right medial and right lateral ankle worse. The wounds of expanded there is erythema and still drainage. There is no pain and no tenderness. We had been using Keracel AG clearly not doing well. The patient has had previous ablations I'm going to send him back to vascular surgery to see if anything else can be done both wound areas are  larger 09/10/17 on evaluation today patient appears to be doing a little bit worse in regard to his medial and lateral malleolus ulcer's of the right lower extremity. He states that even the evening that we began utilizing the Iodoflex he started having burning. Subsequently this never really improved and he eventually discontinue the use of the Iodoflex altogether. Unfortunately he did not let us know about any of this until today. I'm unsure of any specific infection at this point although I am going to obtain a wound culture today in order to see if there's anything that could potentially be causing an infection as well. It does sound as if he's had the Iodoflex before and did not have this kind of reaction although this does sound very specific for a reaction to the Iodoflex. 09/17/17 on evaluation today patient appears to be doing significantly better compared to last week's evaluation. At that time it actually appears that he did have an infection the good news is that we have been able to get this under control with [facility] he's not having as much pain and discomfort he still does have some erythema surrounding the medial aspect wound. He has been tolerating the Dakin's soaked gauze dressing which is excellent and that  his wounds do not seem to have as much adherent slough. Overall I'm pleased with the progress she's made in last week. Patient History Information obtained from Patient. Social History Former smoker - 10 years ago, Marital Status - Married, Alcohol Use - Moderate, Drug Use - No History, Caffeine Use - Never. Medical And Surgical History Notes Constitutional Symptoms (General Health) Vein Filter (groin area); CODA; H/O Blood Clots; pulmonary hypertensive arterial disease Review of Systems (ROS) Constitutional Symptoms (General Health) The patient has no complaints or symptoms. Respiratory The patient has no complaints or  symptoms. Cardiovascular Complains or has symptoms of LE edema. Psychiatric The patient has no complaints or symptoms. IAN, CASTAGNA ([id number]) Objective Constitutional Well-nourished and well-hydrated in no acute distress. Vitals Time Taken: 8:09 AM, Height: 76 in, Weight: 238 lbs, BMI: 29, Temperature: 97.7 F, Pulse: 76 bpm, Respiratory Rate: 18 breaths/min, Blood Pressure: 146/70 mmHg. Respiratory normal breathing without difficulty. Psychiatric this patient is able to make decisions and demonstrates good insight into disease process. Alert and Oriented x 3. pleasant and cooperative. General Notes: Patient's wound does show slough covering at this point in time. There does not appear to be any evidence of severe infection although he does have erythema noted of the perimeter of the medial right ankle ulcer. I still think the antibiotics will likely be appropriate to ensure everything clears completely. I did perform sharp debridement of both the medial and lateral ulcers this was much more easily accomplished than in past weeks and months that I have seen him. I do believe the Dakin's is doing very well and keeping the wounds much cleaner than previously noted. Integumentary (Hair, Skin) Wound #1 status is Open. Original cause of wound was Gradually Appeared. The wound is located on the Right,Lateral Malleolus. The wound measures 5.8cm length x 3.5cm width x 0.2cm depth; 15.944cm^2 area and 3.189cm^3 volume. There is Fat Layer (Subcutaneous Tissue) Exposed exposed. There is no tunneling or undermining noted. There is a large amount of serous drainage noted. The wound margin is distinct with the outline attached to the wound base. There is no granulation within the wound bed. There is a large (67-100%) amount of necrotic tissue within the wound bed including Adherent Slough. The periwound skin appearance exhibited: Scarring, Maceration, Hemosiderin Staining, Erythema. The  periwound skin appearance did not exhibit: Callus, Crepitus, Excoriation, Induration, Rash, Dry/Scaly, Atrophie Blanche, Cyanosis, Ecchymosis, Mottled, Pallor, Rubor. The surrounding wound skin color is noted with erythema which is circumferential. Periwound temperature was noted as No Abnormality. The periwound has tenderness on palpation. Wound #2 status is Open. Original cause of wound was Gradually Appeared. The wound is located on the Right,Medial Malleolus. The wound measures 3.9cm length x 4cm width x 0.1cm depth; 12.252cm^2 area and 1.225cm^3 volume. There is Fat Layer (Subcutaneous Tissue) Exposed exposed. There is no tunneling or undermining noted. There is a large amount of serous drainage noted. The wound margin is distinct with the outline attached to the wound base. There is no granulation within the wound bed. There is a large (67-100%) amount of necrotic tissue within the wound bed including Adherent Slough. The periwound skin appearance exhibited: Scarring, Maceration, Hemosiderin Staining, Erythema. The periwound skin appearance did not exhibit: Callus, Crepitus, Excoriation, Induration, Rash, Dry/Scaly, Atrophie Blanche, Cyanosis, Ecchymosis, Mottled, Pallor, Rubor. The surrounding wound skin color is noted with erythema which is circumferential. Periwound temperature was noted as No Abnormality. The periwound has tenderness on palpation. 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 There was a Skin/Subcutaneous Tissue Debridement ([id number]) debridement with total area of 20.3 sq cm performed by STONE III, HOYT E., PA-C. with the following instrument(s): Curette to remove Viable and Non-Viable tissue/material including Exudate, Fibrin/Slough, and Subcutaneous after achieving pain control using Lidocaine 4% Topical Solution. A time out was conducted at 08:34, prior to the start of the procedure. A Minimum amount of bleeding was controlled with Pressure. The procedure was tolerated well with a pain level of 0 throughout and a pain level of 0 following the procedure. Post Debridement Measurements: 5.8cm length x 3.5cm width x 0.2cm depth; 3.189cm^3 volume. Character of Wound/Ulcer Post Debridement requires further debridement. Severity of Tissue Post Debridement is: Fat layer exposed. 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 There was a Skin/Subcutaneous Tissue Debridement ([id number]) debridement with total area of 15.6 sq cm performed by STONE III, HOYT E., PA-C. with the following instrument(s): Curette to remove Viable and Non-Viable tissue/material including Exudate, Fibrin/Slough, and Subcutaneous after achieving pain control using Lidocaine 4% Topical Solution. A time out was conducted at 08:34, prior to the start of the procedure. A Minimum amount of bleeding was controlled with Pressure. The procedure was tolerated well with a pain level of 0 throughout and a pain level of 0 following the procedure. Post Debridement Measurements: 3.9cm length x  4cm width x 0.2cm depth; 2.45cm^3 volume. Character of Wound/Ulcer Post Debridement requires further debridement. Severity of Tissue Post Debridement is: Fat layer exposed. Post procedure Diagnosis Wound #2: Same as Pre-Procedure Plan Wound Cleansing: Wound #1 Right,Lateral Malleolus: Clean wound with Normal Saline. Cleanse wound with mild soap and water May Shower, gently pat wound dry prior to applying new dressing. Wound #2 Right,Medial Malleolus: Clean wound with Normal Saline. Cleanse wound with mild soap and water May Shower, gently pat wound dry prior to applying new dressing. Anesthetic (add to Medication List): Wound #1 Right,Lateral Malleolus: Topical Lidocaine 4% cream applied to wound bed prior to debridement (In Clinic Only). CELESTINE, BOUGIE ([id number]) Wound #2 Right,Medial Malleolus: Topical Lidocaine 4% cream applied to wound bed prior to debridement (In Clinic Only). Skin Barriers/Peri-Wound Care: Wound #1 Right,Lateral Malleolus: Barrier cream Wound #2 Right,Medial Malleolus: Barrier cream Primary Wound Dressing: Wound #1 Right,Lateral Malleolus: Other: - Dakins Solution Wound #2 Right,Medial Malleolus: Other: - Dakins Solution Secondary Dressing: Wound #1 Right,Lateral Malleolus: ABD pad Conform/Kerlix XtraSorb Wound #2 Right,Medial Malleolus: ABD pad Conform/Kerlix XtraSorb Dressing Change Frequency: Wound #1 Right,Lateral Malleolus: Change dressing every other day. Wound #2 Right,Medial Malleolus: Change dressing every other day. Follow-up Appointments: Wound #1 Right,Lateral Malleolus: Return Appointment in 1 week. Wound #2 Right,Medial Malleolus: Return Appointment in 1 week. Edema Control: Wound #1 Right,Lateral Malleolus: Elevate legs to the level of the heart and pump ankles  as often as possible Wound #2 Right,Medial Malleolus: Elevate legs to the level of the heart and pump ankles as often as possible Additional Orders /  Instructions: Wound #1 Right,Lateral Malleolus: Increase protein intake. Wound #2 Right,Medial Malleolus: Increase protein intake. Medications-please add to medication list.: Wound #1 Right,Lateral Malleolus: Other: - Vitamin C, Zinc, MVI Wound #2 Right,Medial Malleolus: Other: - Vitamin C, Zinc, MVI The following medication(s) was prescribed: lidocaine topical 4 % cream 1 1 cream topical was prescribed at facility Bactrim DS oral 800 mg-160 mg tablet 1 1 tablet oral taken 2 times a day for 10 days starting 09/17/2017 I'm gonna recommend that we continue with the Current wound care measures as this seems to be doing excellent at this point. Patient is in agreement with the plan. I will subsequently send in a prescription for Bactrim for him based on the culture results. We will subsequently see how things are doing in the next week although I think that he is on the right track at this point with the Dakin's dressings that he is performing daily. We will continue. DADRIAN, BALLANTINE ([id number]) Please see above for specific wound care orders. We will see patient for re-evaluation in 1 week(s) here in the clinic. If anything worsens or changes patient will contact our office for additional recommendations. Electronic Signature(s) Signed: 09/18/2017 7:56:11 AM By: Lenda Kelp PA-C Entered By: Lenda Kelp on 09/17/2017 08:55:15 [name] ([id number]) -------------------------------------------------------------------------------- ROS/PFSH Details Patient Name: [name] Date of Service: 09/17/2017 8:00 AM Medical Record Number: [id number] Patient Account Number: [id number] Date of Birth/Sex: 25-Jan-1946 (72 y.o. Male) Treating RN: Ashok Cordia, [name] Primary Care Provider: [name] Other Clinician: Referring Provider: [name] Treating Provider/Extender: STONE III, HOYT Weeks in Treatment: 56 Information Obtained From Patient Wound History Do you  currently have one or more open woundso Yes How many open wounds do you currently haveo 1 Approximately how long have you had your woundso 5 months How have you been treating your wound(s) until nowo saline, dressing Has your wound(s) ever healed and then re-openedo Yes Have you had any lab work done in the past montho No Have you tested positive for an antibiotic resistant organism (MRSA, VRE)o No Have you tested positive for osteomyelitis (bone infection)o No Have you had any tests for circulation on your legso No Constitutional Symptoms (General Health) Complaints and Symptoms: No Complaints or Symptoms Complaints and Symptoms: Negative for: Fever; Chills Medical History: Past Medical History Notes: Vein Filter (groin area); CODA; H/O Blood Clots; pulmonary hypertensive arterial disease Cardiovascular Complaints and Symptoms: Positive for: LE edema Medical History: Negative for: Angina; Arrhythmia; Congestive Heart Failure; Coronary Artery Disease; Deep Vein Thrombosis; Hypertension; Hypotension; Myocardial Infarction; Peripheral Arterial Disease; Peripheral Venous Disease; Phlebitis; Vasculitis Eyes Medical History: Positive for: Cataracts - removed Negative for: Glaucoma; Optic Neuritis Ear/Nose/Mouth/Throat Medical History: Negative for: Chronic sinus problems/congestion Hematologic/Lymphatic JIYAAN, STEINHAUSER ([id number]) Medical History: Negative for: Anemia; Hemophilia; Human Immunodeficiency Virus; Lymphedema; Sickle Cell Disease Respiratory Complaints and Symptoms: No Complaints or Symptoms Medical History: Positive for: Chronic Obstructive Pulmonary Disease (COPD) Negative for: Aspiration; Asthma; Pneumothorax; Sleep Apnea; Tuberculosis Gastrointestinal Medical History: Negative for: Cirrhosis ; Colitis; Crohnos; Hepatitis A; Hepatitis B; Hepatitis C Endocrine Medical History: Negative for: Type I Diabetes; Type II Diabetes Genitourinary Medical  History: Negative for: End Stage Renal Disease Immunological Medical History: Negative for: Lupus Erythematosus; Raynaudos Integumentary (Skin) Medical History: Negative for: History of Burn; History of pressure wounds Musculoskeletal Medical History: Positive for: Osteoarthritis  Negative for: Gout; Rheumatoid Arthritis; Osteomyelitis Neurologic Medical History: Negative for: Dementia; Neuropathy; Quadriplegia; Paraplegia; Seizure Disorder Oncologic Medical History: Negative for: Received Chemotherapy; Received Radiation Psychiatric Complaints and Symptoms: No Complaints or Symptoms Medical History: ALVAH, LAGROW ([id number]) Negative for: Anorexia/bulimia; Confinement Anxiety HBO Extended History Items Eyes: Cataracts Immunizations Pneumococcal Vaccine: Received Pneumococcal Vaccination: No Implantable Devices Family and Social History Former smoker - 10 years ago; Marital Status - Married; Alcohol Use: Moderate; Drug Use: No History; Caffeine Use: Never; Advanced Directives: No; Patient does not want information on Advanced Directives; Living Will: No; Medical Power of Attorney: No Physician Affirmation I have reviewed and agree with the above information. Electronic Signature(s) Signed: 09/17/2017 9:12:55 AM By: Alejandro Mulling Signed: 09/18/2017 7:56:11 AM By: Lenda Kelp PA-C Entered By: Lenda Kelp on 09/17/2017 08:52:59 [name] ([id number]) -------------------------------------------------------------------------------- SuperBill Details Patient Name: [name] Date of Service: 09/17/2017 Medical Record Number: [id number] Patient Account Number: [id number] Date of Birth/Sex: 02-Feb-1946 (72 y.o. 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, initial encounter T81.31XD Disruption of external operation (surgical) wound, not elsewhere classified, subsequent encounter L97.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 of skin Facility Procedures CPT4 Code: 62952841 Description: 11042 - DEB SUBQ TISSUE 20 SQ CM/< ICD-10 Diagnosis Description L97.212 Non-pressure chronic ulcer of right calf with fat layer expos Modifier: ed Quantity: 1 CPT4 Code: 32440102 Description: 11045 - DEB SUBQ TISS EA ADDL 20CM ICD-10 Diagnosis Description L97.212 Non-pressure chronic ulcer of right calf with fat layer expos Modifier: ed Quantity: 1 Physician Procedures CPT4 Code: 7253664 Description: 11042 - WC PHYS SUBQ TISS 20 SQ CM ICD-10 Diagnosis Description L97.212 Non-pressure chronic ulcer of right calf with fat layer expose Modifier: d Quantity: 1 CPT4 Code: 4034742 Description: 11045 - WC PHYS SUBQ TISS EA ADDL 20 CM ICD-10 Diagnosis Description L97.212 Non-pressure chronic ulcer of right calf with fat layer expose Modifier: d Quantity: 1 Electronic Signature(s) Signed: 09/18/2017 7:56:11 AM By: Lenda Kelp PA-C Entered By: Lenda Kelp on 09/17/2017 08:55:41

## 2017-09-20 NOTE — Progress Notes (Signed)
[name], DIGUGLIELMO ([id number]) Visit Report for 09/17/2017 Arrival Information Details Patient Name: [name], [name]. Date of Service: 09/17/2017 8:00 AM Medical Record Number: [id number] Patient Account Number: [id number] Date of Birth/Sex: 01-Jan-1946 (72 y.o. Male) Treating RN: [name] Primary Care Paetyn Pietrzak: [name] Other Clinician: Referring Crockett Rallo: [name] Treating Antanasia Kaczynski/Extender: STONE III, HOYT Weeks in Treatment: 49 Visit Information History Since Last Visit All ordered tests and consults were completed: No Patient Arrived: Ambulatory Added or deleted any medications: No Arrival Time: 08:08 Any new allergies or adverse reactions: No Accompanied By: self Had a fall or experienced change in No Transfer Assistance: None activities of daily living that may affect Patient Identification Verified: Yes risk of falls: Secondary Verification Process Yes Signs or symptoms of abuse/neglect since last visito No Completed: Hospitalized since last visit: No Patient Requires Transmission-Based No Has Dressing in Place as Prescribed: Yes Precautions: Pain Present Now: No Patient Has Alerts: Yes Patient Alerts: Patient on Blood Thinner Electronic Signature(s) Signed: 09/17/2017 9:12:55 AM By: Alejandro Mulling Entered By: Alejandro Mulling on 09/17/2017 08:09:05 [name] ([id number]) -------------------------------------------------------------------------------- Encounter Discharge Information Details Patient Name: [name] Date of Service: 09/17/2017 8:00 AM Medical Record Number: [id number] Patient Account Number: [id number] Date of Birth/Sex: June 18, 1946 (72 y.o. Male) Treating RN: [name] Primary Care Aveah Castell: [name] Other Clinician: Referring Enrique Weiss: [name] Treating Shannel Zahm/Extender: STONE III, HOYT Weeks in Treatment: 53 Encounter Discharge Information Items Discharge Pain Level: 0 Discharge Condition:  Stable Ambulatory Status: Ambulatory Discharge Destination: Home Transportation: Private Auto Accompanied By: self Schedule Follow-up Appointment: Yes Medication Reconciliation completed and No provided to Patient/Care Izacc Demeyer: Patient Clinical Summary of Care: Declined Electronic Signature(s) Signed: 09/19/2017 11:25:55 AM By: Gwenlyn Perking Entered By: Gwenlyn Perking on 09/17/2017 08:57:14 [name] ([id number]) -------------------------------------------------------------------------------- Lower Extremity Assessment Details Patient Name: [name] Date of Service: 09/17/2017 8:00 AM Medical Record Number: [id number] Patient Account Number: [id number] Date of Birth/Sex: [birth year] (72 y.o. Male) Treating RN: [name] Primary Care Maurico Perrell: [name] Other Clinician: Referring Denver Bentson: [name] Treating Micharl Helmes/Extender: STONE III, HOYT Weeks in Treatment: 87 Vascular Assessment Pulses: Dorsalis Pedis Palpable: [Right:Yes] Posterior Tibial Extremity colors, hair growth, and conditions: Extremity Color: [Right:Normal] Temperature of Extremity: [Right:Warm] Capillary Refill: [Right:< 3 seconds] Electronic Signature(s) Signed: 09/17/2017 9:12:55 AM By: Alejandro Mulling Entered By: Alejandro Mulling on 09/17/2017 08:20:02 [name] ([id number]) -------------------------------------------------------------------------------- Multi Wound Chart Details Patient Name: [name] Date of Service: 09/17/2017 8:00 AM Medical Record Number: [id number] Patient Account Number: [id number] Date of Birth/Sex: 1946-05-05 (72 y.o. Male) Treating RN: Ashok Cordia, [name] Primary Care Niki Payment: [name] Other Clinician: Referring Lincy Belles: [name] Treating Tenleigh Byer/Extender: STONE III, HOYT Weeks in Treatment: 87 Vital Signs Height(in): 76 Pulse(bpm): 76 Weight(lbs): 238 Blood Pressure(mmHg): 146/70 Body Mass Index(BMI):  29 Temperature(F): 97.7 Respiratory Rate 18 (breaths/min): Photos: [1:No Photos] [2:No Photos] [N/A:N/A] Wound Location: [1:Right Malleolus - Lateral] [2:Right Malleolus - Medial] [N/A:N/A] Wounding Event: [1:Gradually Appeared] [2:Gradually Appeared] [N/A:N/A] Primary Etiology: [1:Venous Leg Ulcer] [2:Venous Leg Ulcer] [N/A:N/A] Comorbid History: [1:Cataracts, Chronic Obstructive Cataracts, Chronic Obstructive N/A Pulmonary Disease (COPD), Pulmonary Disease (COPD), Osteoarthritis] [2:Osteoarthritis] Date Acquired: [1:08/11/2015] [2:01/30/2016] [N/A:N/A] Weeks of Treatment: [1:87] [2:85] [N/A:N/A] Wound Status: [1:Open] [2:Open] [N/A:N/A] Clustered Wound: [1:No] [2:Yes] [N/A:N/A] Measurements L x W x D [1:5.8x3.5x0.2] [2:3.9x4x0.1] [N/A:N/A] (cm) Area (cm) : [1:15.944] [2:12.252] [N/A:N/A] Volume (cm) : [1:3.189] [2:1.225] [N/A:N/A] % Reduction in Area: [1:7.70%] [2:-6.10%] [N/A:N/A] % Reduction in Volume: [1:38.50%] [2:-6.10%] [N/A:N/A] Classification: [1:Full Thickness Without Exposed Support Structures] [2:Full Thickness  Without Exposed Support Structures] [N/A:N/A] Exudate Amount: [1:Large] [2:Large] [N/A:N/A] Exudate Type: [1:Serous] [2:Serous] [N/A:N/A] Exudate Color: [1:amber] [2:amber] [N/A:N/A] Wound Margin: [1:Distinct, outline attached] [2:Distinct, outline attached] [N/A:N/A] Granulation Amount: [1:None Present (0%)] [2:None Present (0%)] [N/A:N/A] Necrotic Amount: [1:Large (67-100%)] [2:Large (67-100%)] [N/A:N/A] Exposed Structures: [1:Fat Layer (Subcutaneous Tissue) Exposed: Yes Fascia: No Tendon: No Muscle: No Joint: No Bone: No] [2:Fat Layer (Subcutaneous Tissue) Exposed: Yes Fascia: No Tendon: No Muscle: No Joint: No Bone: No] [N/A:N/A] Epithelialization: [1:None] [2:None] [N/A:N/A] Periwound Skin Texture: [1:Scarring: Yes Excoriation: No Induration: No Callus: No] [2:Scarring: Yes Excoriation: No Induration: No Callus: No] [N/A:N/A] Crepitus: No Crepitus:  No Rash: No Rash: No Periwound Skin Moisture: Maceration: Yes Maceration: Yes N/A Dry/Scaly: No Dry/Scaly: No Periwound Skin Color: Erythema: Yes Erythema: Yes N/A Hemosiderin Staining: Yes Hemosiderin Staining: Yes Atrophie Blanche: No Atrophie Blanche: No Cyanosis: No Cyanosis: No Ecchymosis: No Ecchymosis: No Mottled: No Mottled: No Pallor: No Pallor: No Rubor: No Rubor: No Erythema Location: Circumferential Circumferential N/A Temperature: No Abnormality No Abnormality N/A Tenderness on Palpation: Yes Yes N/A Wound Preparation: Ulcer Cleansing: Ulcer Cleansing: N/A Rinsed/Irrigated with Saline Rinsed/Irrigated with Saline Topical Anesthetic Applied: Topical Anesthetic Applied: Other: lidocaine 4% Other: lidocaine 4% Treatment Notes Electronic Signature(s) Signed: 09/17/2017 9:12:55 AM By: Alejandro Mulling Entered By: Alejandro Mulling on 09/17/2017 08:22:40 [name] ([id number]) -------------------------------------------------------------------------------- Multi-Disciplinary Care Plan Details Patient Name: [name] Date of Service: 09/17/2017 8:00 AM Medical Record Number: [id number] Patient Account Number: [id number] Date of Birth/Sex: [birth year] (72 y.o. Male) Treating RN: [name] Primary Care Meekah Math: [name] Other Clinician: Referring Keven Soucy: [name] Treating Latressa Harries/Extender: STONE III, HOYT Weeks in Treatment: 87 Active Inactive ` Venous Leg Ulcer Nursing Diagnoses: Knowledge deficit related to disease process and management Goals: Patient will maintain optimal edema control Date Initiated: 04/03/2016 Target Resolution Date: 11/24/2016 Goal Status: Active Interventions: Compression as ordered Treatment Activities: Therapeutic compression applied : 04/03/2016 Notes: ` Wound/Skin Impairment Nursing Diagnoses: Impaired tissue integrity Goals: Ulcer/skin breakdown will heal within 14 weeks Date  Initiated: 01/17/2016 Target Resolution Date: 11/24/2016 Goal Status: Active Interventions: Assess ulceration(s) every visit Notes: Electronic Signature(s) Signed: 09/17/2017 9:12:55 AM By: Alejandro Mulling Entered By: Alejandro Mulling on 09/17/2017 08:22:32 [name] ([id number]) -------------------------------------------------------------------------------- Pain Assessment Details Patient Name: [name] Date of Service: 09/17/2017 8:00 AM Medical Record Number: [id number] Patient Account Number: [id number] Date of Birth/Sex: [birth year] (72 y.o. Male) Treating RN: [name] Primary Care Severa Jeremiah: [name] Other Clinician: Referring Anupama Piehl: [name] Treating Kayston Jodoin/Extender: STONE III, HOYT Weeks in Treatment: 87 Active Problems Location of Pain Severity and Description of Pain Patient Has Paino No Site Locations Pain Management and Medication Current Pain Management: Electronic Signature(s) Signed: 09/17/2017 9:12:55 AM By: Alejandro Mulling Entered By: Alejandro Mulling on 09/17/2017 08:09:18 [name] ([id number]) -------------------------------------------------------------------------------- Patient/Caregiver Education Details Patient Name: [name] Date of Service: 09/17/2017 8:00 AM Medical Record Number: [id number] Patient Account Number: [id number] Date of Birth/Gender: 14-Mar-1946 (72 y.o. Male) Treating RN: [name] Primary Care Physician: [name] Other Clinician: Referring Physician: [name] Treating Physician/Extender: Linwood Dibbles, HOYT Weeks in Treatment: 55 Education Assessment Education Provided To: Patient Education Topics Provided Wound/Skin Impairment: Handouts: Caring for Your Ulcer, Other: change dressing as orddered Methods: Demonstration, Explain/Verbal Responses: State content correctly Electronic Signature(s) Signed: 09/17/2017 9:12:55 AM By: Alejandro Mulling Entered By:  Alejandro Mulling on 09/17/2017 08:23:26 [name] ([id number]) -------------------------------------------------------------------------------- Wound Assessment Details Patient Name: [name] Date of Service: 09/17/2017 8:00 AM Medical Record Number:  [id number] Patient Account Number: [id number] Date of Birth/Sex: 02-01-1946 [age] y.o. Male) Treating RN: Ashok CordiaPinkerton, [name] Primary Care Tymothy Cass: Darreld McleanMILES, LINDA Other Clinician: Referring Crucita Lacorte: Darreld McleanMILES, LINDA Treating Gill Delrossi/Extender: STONE III, HOYT Weeks in Treatment: 87 Wound Status Wound Number: 1 Primary Venous Leg Ulcer Etiology: Wound Location: Right Malleolus - Lateral Wound Open Wounding Event: Gradually Appeared Status: Date Acquired: 08/11/2015 Comorbid Cataracts, Chronic Obstructive Pulmonary Weeks Of Treatment: 87 History: Disease (COPD), Osteoarthritis Clustered Wound: No Photos Photo Uploaded By: Alejandro MullingPinkerton, Debra on 09/17/2017 16:58:41 Wound Measurements Length: (cm) 5.8 Width: (cm) 3.5 Depth: (cm) 0.2 Area: (cm) 15.944 Volume: (cm) 3.189 % Reduction in Area: 7.7% % Reduction in Volume: 38.5% Epithelialization: None Tunneling: No Undermining: No Wound Description Full Thickness Without Exposed Support Classification: Structures Wound Margin: Distinct, outline attached Exudate Large Amount: Exudate Type: Serous Exudate Color: amber Foul Odor After Cleansing: No Slough/Fibrino Yes Wound Bed Granulation Amount: None Present (0%) Exposed Structure Necrotic Amount: Large (67-100%) Fascia Exposed: No Necrotic Quality: Adherent Slough Fat Layer (Subcutaneous Tissue) Exposed: Yes Tendon Exposed: No Muscle Exposed: No Joint Exposed: No Bone Exposed: No Mccolgan, Charlene C. ([id number]) Periwound Skin Texture Texture Color No Abnormalities Noted: No No Abnormalities Noted: No Callus: No Atrophie Blanche: No Crepitus: No Cyanosis: No Excoriation: No Ecchymosis: No Induration:  No Erythema: Yes Rash: No Erythema Location: Circumferential Scarring: Yes Hemosiderin Staining: Yes Mottled: No Moisture Pallor: No No Abnormalities Noted: No Rubor: No Dry / Scaly: No Maceration: Yes Temperature / Pain Temperature: No Abnormality Tenderness on Palpation: Yes Wound Preparation Ulcer Cleansing: Rinsed/Irrigated with Saline Topical Anesthetic Applied: Other: lidocaine 4%, Treatment Notes Wound #1 (Right, Lateral Malleolus) 1. Cleansed with: Clean wound with Normal Saline 2. Anesthetic Topical Lidocaine 4% cream to wound bed prior to debridement 3. Peri-wound Care: Barrier cream 4. Dressing Applied: Other dressing (specify in notes) 5. Secondary Dressing Applied ABD Pad Dry Gauze Kerlix/Conform Notes xtrasorb, dakins solution Electronic Signature(s) Signed: 09/17/2017 9:12:55 AM By: Alejandro MullingPinkerton, Debra Entered By: Alejandro MullingPinkerton, Debra on 09/17/2017 08:19:45 [name] StackBLACKWELL, Almer C. ([id number]) -------------------------------------------------------------------------------- Wound Assessment Details Patient Name: [name] StackBLACKWELL, Zerek C. Date of Service: 09/17/2017 8:00 AM Medical Record Number: [id number] Patient Account Number: [id number] Date of Birth/Sex: 02-01-1946 [age] y.o. Male) Treating RN: Ashok CordiaPinkerton, [name] Primary Care Benzion Mesta: Darreld McleanMILES, LINDA Other Clinician: Referring Caylie Sandquist: Darreld McleanMILES, LINDA Treating Tahani Potier/Extender: STONE III, HOYT Weeks in Treatment: 87 Wound Status Wound Number: 2 Primary Venous Leg Ulcer Etiology: Wound Location: Right Malleolus - Medial Wound Open Wounding Event: Gradually Appeared Status: Date Acquired: 01/30/2016 Comorbid Cataracts, Chronic Obstructive Pulmonary Weeks Of Treatment: 85 History: Disease (COPD), Osteoarthritis Clustered Wound: Yes Photos Photo Uploaded By: Alejandro MullingPinkerton, Debra on 09/17/2017 16:59:00 Wound Measurements Length: (cm) 3.9 Width: (cm) 4 Depth: (cm) 0.1 Area: (cm) 12.252 Volume: (cm) 1.225 %  Reduction in Area: -6.1% % Reduction in Volume: -6.1% Epithelialization: None Tunneling: No Undermining: No Wound Description Full Thickness Without Exposed Support Classification: Structures Wound Margin: Distinct, outline attached Exudate Large Amount: Exudate Type: Serous Exudate Color: amber Foul Odor After Cleansing: No Slough/Fibrino Yes Wound Bed Granulation Amount: None Present (0%) Exposed Structure Necrotic Amount: Large (67-100%) Fascia Exposed: No Necrotic Quality: Adherent Slough Fat Layer (Subcutaneous Tissue) Exposed: Yes Tendon Exposed: No Muscle Exposed: No Joint Exposed: No Bone Exposed: No Sauser, Kourtney C. ([id number]) Periwound Skin Texture Texture Color No Abnormalities Noted: No No Abnormalities Noted: No Callus: No Atrophie Blanche: No Crepitus: No Cyanosis: No Excoriation: No Ecchymosis: No Induration: No Erythema: Yes Rash: No Erythema Location: Circumferential Scarring: Yes Hemosiderin Staining: Yes Mottled: No Moisture  Pallor: No No Abnormalities Noted: No Rubor: No Dry / Scaly: No Maceration: Yes Temperature / Pain Temperature: No Abnormality Tenderness on Palpation: Yes Wound Preparation Ulcer Cleansing: Rinsed/Irrigated with Saline Topical Anesthetic Applied: Other: lidocaine 4%, Treatment Notes Wound #2 (Right, Medial Malleolus) 1. Cleansed with: Clean wound with Normal Saline 2. Anesthetic Topical Lidocaine 4% cream to wound bed prior to debridement 3. Peri-wound Care: Barrier cream 4. Dressing Applied: Other dressing (specify in notes) 5. Secondary Dressing Applied ABD Pad Dry Gauze Kerlix/Conform Notes xtrasorb, dakins solution Electronic Signature(s) Signed: 09/17/2017 9:12:55 AM By: Alejandro Mulling Entered By: Alejandro Mulling on 09/17/2017 08:18:43 [name] ([id number]) -------------------------------------------------------------------------------- Vitals Details Patient Name: [name] Date of Service: 09/17/2017 8:00 AM Medical Record Number: [id number] Patient Account Number: [id number] Date of Birth/Sex: Feb 02, 1946 (72 y.o. Male) Treating RN: Ashok Cordia, [name] Primary Care Yen Wandell: [name] Other Clinician: Referring Sidni Fusco: [name] Treating Murdock Jellison/Extender: STONE III, HOYT Weeks in Treatment: 87 Vital Signs Time Taken: 08:09 Temperature (F): 97.7 Height (in): 76 Pulse (bpm): 76 Weight (lbs): 238 Respiratory Rate (breaths/min): 18 Body Mass Index (BMI): 29 Blood Pressure (mmHg): 146/70 Reference Range: 80 - 120 mg / dl Electronic Signature(s) Signed: 09/17/2017 9:12:55 AM By: Alejandro Mulling Entered By: Alejandro Mulling on 09/17/2017 08:14:03

## 2017-09-24 ENCOUNTER — Encounter: Payer: Medicare HMO | Admitting: Nurse Practitioner

## 2017-09-24 DIAGNOSIS — I87331 Chronic venous hypertension (idiopathic) with ulcer and inflammation of right lower extremity: Secondary | ICD-10-CM | POA: Diagnosis not present

## 2017-09-25 NOTE — Progress Notes (Signed)
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 aggressive in terms of the wounds on his lateral and medial right ankle in the setting of previous skin grafting. Yesterday he noticed drainage at one of his surgical sites from his venous ablation on the right calf. He took off the bandage over this area felt a "popping" sensation and a reddish-brown drainage. He is not complaining of any pain 10/16/16; he continues to make nice progression in terms of the wounds on the lateral  and medial malleolus. Both smaller using Iodoflex. He had a surgical area in his posterior mid calf we have been using iodoform.  All the wounds are down and dimensions 10/23/16; the patient arrives today with no complaints. He states the Iodoflex is a bit uncomfortable. He is not systemically unwell. We have been using Iodoflex to the lateral right ankle and the medial and Aquacel Ag to the reflux surgical wound on the posterior right calf. All of these wounds are doing well 10/30/16; patient states he has no pain no systemic symptoms. I changed him to [facility] last week. Although the wounds are doing well 11/06/16; patient reports no pain or systemic symptoms. We continue with Hydrofera Blue. Both wound areas on the medial and lateral ankle appear to be doing well with improvement and dimensions and improvement in the wound bed. 11/13/16; patient's dimensions continued to improve. We continue with Hydrofera Blue on the medial and lateral side. Appear to be doing well with healthy granulation and advancing epithelialization 11/20/16; patient's dimensions improving laterally by about half a centimeter in length. Otherwise no change on the medial side. Using [facility] 12/04/16; no major change in patient's wound dimensions. Intake nurse reports more drainage. 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 He is voicing no complaints or concerns. He has been tolerating his twice weekly compression therapy changes 12/11/16 Patient complains of pain and increased drainage.. wants hydrofera blue 12/18/16 improvement. Sorbact 12/25/16; medial wound is smaller, lateral measures the same. Still on sorbact 01/01/17; medial wound continues to be smaller, lateral measures about the same however there is clearly advancing epithelialization here as well in fact I think the wound will ultimately divided into  2 open areas 01/08/17; unfortunately today fairly significant regression in several areas. Surface of the lateral wound covered again in adherent necrotic material which is difficult to debridement. He has significant surrounding skin maceration. The expanding area of tissue epithelialization in the middle of the wound that was encouraging last week appears to be smaller. There is no surrounding tenderness. The area on the medial leg also did not seem to be as healthy as last week, the reason for this regression this week is not totally clear. We have been using Sorbact for the last 4 weeks. We'll switched of polymen AG which we will order via home medical supply. If there is a problem with this would switch back to Iodoflex 01/15/18; drainage,odor. No change. Switched to polymen last week 01/22/17; still continuous drainage. Culture I did last week showed a few Proteus pansensitive. I did this culture because of drainage. Put him on Augmentin which she has been taking since Saturday however he is developed 4-5 liquid bowel movements. He is also on Coumadin. Beyond this wound is not changed at all, still nonviable necrotic surface material which I debrided reveals healthy granulation line 01/29/17; still copious amounts of drainage reported by her intake nurse. Wound measuring slightly smaller. Currently fact on Iodoflex although I'm looking forward to changing back to perhaps [facility] or polymen AG 02/05/17; still large amounts of drainage and presenting with really large amounts of adherent slough and necrotic material over the remaining open area of the wound. We have been using Iodoflex but with little improvement in the surface. Change to [facility] 02/12/17; still large amount of drainage. Much less adherent slough however. Started [employer]	[place] last week DAMAURI, [name] ([id number]) 02/19/17; drainage is better this week. Much less adherent slough. Perhaps some improvement in  dimensions. Using Hydrofera Blue 02/26/17; severe venous insufficiency wounds on  the right lateral and right medial leg. Drainage is some better and slough is less adherent we've been using Hydrofera Blue 03/05/17 on evaluation today patient appears to be doing well. His wounds have been decreasing in size and overall he is pleased with how this is progressing. We are awaiting approval for the epigraph which has previously been recommended in the meantime the [facility] Dressing is to be doing very well for him. 03/12/17; wound dimensions are smaller still using Hydrofera Blue. Comes in on Fridays for a dressing change. 03/19/17; wound dimensions continued to contract. Healing of this wound is complicated by continuous significant drainage as well as recurrent buildup of necrotic surface material. We looked into Apligraf and he has a $290 co-pay per application but truthfully I think the drainage as well as the nonviable surface would preclude use of Apligraf there are any other skin substitute at this point therefore the continued plan will be debridement each clinic visit, 2 times a week dressing changes and continued use of Hydrofera Blue. improvement has been very slow but sustained 03/26/17 perhaps slight improvements in peripheral epithelialization is especially inferiorly. Still with large amount of drainage and tightly adherent necrotic surface on arrival. Along with the intake nurse I reviewed previous treatment. He worsened on Iodoflex and had a 4 week trial of sorbact, Polymen AG and a long courses of Aquacel Ag. He is not a candidate for advanced treatment options for many reasons 04/09/17 on evaluation today patient's right lower extremity wounds appear to be doing a little better. Fortunately he has no significant discomfort and has been tolerating the dressing changes including the wraps without complication. With that being said he really does not have swelling anymore compared to  what he has had in the past following his vascular intervention. He wonders if potentially we could attempt avoiding the rats to see if he could cleanse the wound in between to try to prevent some of the fiber and its buildup that occurs in the interim between when we see him week to week. No fevers, chills, nausea, or vomiting noted at this time. 04/16/17; noted that the staff made a choice last week not to put him in compression. The patient is changing his dressing at home using [facility] and changing every second day. His wife is washing the wound with saline. He is using kerlix. Surprisingly he has not developed a lot of edema. This choice was made because of the degree of fluid retention and maceration even with changing his dressing twice a week 04/23/17; absolutely no change. Using Hydrofera Blue. Recurrent tightly adherent nonviable surface material. This is been refractory to Iodoflex, sorbact and now Hydrofera Blue. More recently he has been changing his own dressings at home, cleansing the wound in the shower. He has not developed lower extremity edema 04/30/17; if anything the wound is larger still in adherent surface although it debrided easily today. I've been using Mehta honey for 1 week and I'd like to try and do it for a second week this see if we can get some form of viable surface here. 05/07/17; patient arrives with copious amounts of drainage and some pain in the superior part of the wound. He has not been systemically unwell. He's been changing this daily at home 05/14/17; patient arrives today complaining of less drainage and less pain. Dimensions slightly better. Surface culture I did of this last week grew MSSA I put him on dicloxacillin for 10 days. Patient requesting a further prescription  since he feels so much better. He did not obtain the [facility] AG for reasons that aren't clear, he has been using Hydrofera Blue 05/21/17; perhaps some less drainage and less pain. He is  completing another week of doxycycline. Unfortunately there is no change in the wound measurements are appearance still tightly adherent necrotic surface material that is really defied treatment. We have been using Hydrofera Blue most recently. He did not manage to get Anasept. He was told it was prescription at [facility] 05/29/17; absolutely no improvement in these wound areas. He had remote plastic surgery with who was done at [facility] in [place] surrounding this area. This was a traumatic wound with extensive plastic surgery/skin grafting at that time. I switched him to sorbact last week to see if he can do anything about the recurrent necrotic surface and drainage. This is largely been refractory to Iodoflex/Hydrofera Blue/alginates. I believe we tried Elby Showers and more recently Medi honey l however the drainage is really too excessive 06/04/17; patient went to see Dr. Ulice Bold. Per the patient she ordered him a compression stocking. Continued the Anasept gel and sorbact was already on. No major improvement in the condition of the wounds in fact the medial wound looks larger. Again per the patient she did not feel a skin graft or operative debridement was indicated The patient had previous venous ablation in February 2018. Last arterial studies were in December 2017 and were within normal limits 06/18/17; patient arrives back in clinic today using Anasept gel with sorbact. He changes this every day is wearing his own compression stocking. The patient actually saw Dr. Leta Baptist of plastic surgery. I've reviewed her note. The appointment was on 05/30/17. The basic issue with here was that she did not feel that skin grafts for patients with underlying stasis and edema have a good long-term success rate. She also discuss skin substitutes which has been done here as well however I have not been able to get the surface of these wounds to something that I think would support skin substitutes.  This is why he felt he might need an [name], [name]. ([id number]) operative debridement more aggressively than I can do in this clinic Review of systems; is otherwise negative he feels well 07/02/17; patient has been using Anasept gel with Sorbact. He changes this every day scrubs out the wound beds. Arrives today with the wound bed looking some better. Easier debridement 07/16/17; patient has been using Anasept gel was sorbact for about a month now. The necrotic service on his wound bed is better and the drainage is now down to minimal but we've not made any improvements in the epithelialization. Change to Santyl today. Surface of the wound is still not good enough to support skin substitutes 07/30/17 on evaluation today patient appears to be doing very well in regard to his right bimalleolus wounds. He has been tolerating the treatment with the Anasept gel and sorbact. However we were going to try and switch to Santyl to be more aggressive with these wounds. This was however cost prohibitive. Therefore we will likely go back to the sorbact. Nonetheless he is not having any significant discomfort he still is forming slough over the wound location but nothing too significant. The wounds do appear to be filling in nicely. 08/13/17; I have not seen this wound in about a month. There is not much change. The fact the area medially looks larger. He has been using sorbact and Anasept for over a month now without much effect.  Arrives in clinic stating that he does not want the area debrided 08/28/17; patient arrives with the areas on his right medial and right lateral ankle worse. The wounds of expanded there is erythema and still drainage. There is no pain and no tenderness. We had been using Keracel AG clearly not doing well. The patient has had previous ablations I'm going to send him back to vascular surgery to see if anything else can be done both wound areas are larger 09/10/17 on evaluation today  patient appears to be doing a little bit worse in regard to his medial and lateral malleolus ulcer's of the right lower extremity. He states that even the evening that we began utilizing the Iodoflex he started having burning. Subsequently this never really improved and he eventually discontinue the use of the Iodoflex altogether. Unfortunately he did not let us know about any of this until today. I'm unsure of any specific infection at this point although I am going to obtain a wound culture today in order to see if there's anything that could potentially be causing an infection as well. It does sound as if he's had the Iodoflex before and did not have this kind of reaction although this does sound very specific for a reaction to the Iodoflex. 09/17/17 on evaluation today patient appears to be doing significantly better compared to last week's evaluation. At that time it actually appears that he did have an infection the good news is that we have been able to get this under control with switching to the [facility] solution he's not having as much pain and discomfort he still does have some erythema surrounding the medial aspect wound. He has been tolerating the Dakin's soaked gauze dressing which is excellent and that his wounds do not seem to have as much adherent slough. Overall I'm pleased with the progress she's made in last week. 09/24/17 he is here in follow-up evaluation for right bimalleolar venous ulcers. He is tolerating Dakin solution. He is tolerating Bactrim with no GI complaints/disturbances. He tolerated debridement. We will continue with Dakin's, he was instructed to complete the Bactrim as ordered and follow-up next week Patient History Information obtained from Patient. Social History Former smoker - 10 years ago, Marital Status - Married, Alcohol Use - Moderate, Drug Use - No History, Caffeine Use - Never. Medical And Surgical History Notes Constitutional Symptoms (General  Health) Vein Filter (groin area); CODA; H/O Blood Clots; pulmonary hypertensive arterial disease Jarnagin, Brayten C. ([id number]) Objective Constitutional Vitals Time Taken: 8:18 AM, Height: 76 in, Weight: 238 lbs, BMI: 29, Temperature: 99.4 F, Pulse: 90 bpm, Respiratory Rate: 18 breaths/min, Blood Pressure: 159/97 mmHg. Integumentary (Hair, Skin) Wound #1 status is Open. Original cause of wound was Gradually Appeared. The wound is located on the Right,Lateral Malleolus. The wound measures 5.3cm length x 2.8cm width x 0.2cm depth; 11.655cm^2 area and 2.331cm^3 volume. There is Fat Layer (Subcutaneous Tissue) Exposed exposed. There is no tunneling or undermining noted. There is a large amount of serous drainage noted. The wound margin is distinct with the outline attached to the wound base. There is small (1-33%) granulation within the wound bed. There is a large (67-100%) amount of necrotic tissue within the wound bed including Adherent Slough. The periwound skin appearance exhibited: Scarring, Maceration, Hemosiderin Staining, Erythema. The periwound skin appearance did not exhibit: Callus, Crepitus, Excoriation, Induration, Rash, Dry/Scaly, Atrophie Blanche, Cyanosis, Ecchymosis, Mottled, Pallor, Rubor. The surrounding wound skin color is noted with erythema which is circumferential. Periwound temperature was  noted as No Abnormality. The periwound has tenderness on palpation. Wound #2 status is Open. Original cause of wound was Gradually Appeared. The wound is located on the Right,Medial Malleolus. The wound measures 3.6cm length x 4.2cm width x 0.2cm depth; 11.875cm^2 area and 2.375cm^3 volume. There is Fat Layer (Subcutaneous Tissue) Exposed exposed. There is no tunneling or undermining noted. There is a large amount of serous drainage noted. The wound margin is distinct with the outline attached to the wound base. There is small (1-33%) red granulation within the wound bed. There is a  large (67-100%) amount of necrotic tissue within the wound bed including Adherent Slough. The periwound skin appearance exhibited: Scarring, Maceration, Hemosiderin Staining, Erythema. The periwound skin appearance did not exhibit: Callus, Crepitus, Excoriation, Induration, Rash, Dry/Scaly, Atrophie Blanche, Cyanosis, Ecchymosis, Mottled, Pallor, Rubor. The surrounding wound skin color is noted with erythema which is circumferential. Periwound temperature was noted as No Abnormality. The periwound has tenderness on palpation. 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 There was a Skin/Subcutaneous Tissue Debridement ([id number]) debridement with total area of 14.84 sq cm performed by Bonnell Public, NP. with the following instrument(s): Curette to remove Viable and Non-Viable tissue/material including Exudate, Fibrin/Slough, and Subcutaneous after achieving pain control using Lidocaine 4% Topical Solution. A time out was conducted at 08:29, prior to the start of the procedure. A Minimum amount of bleeding was controlled with Pressure. The procedure was tolerated well with a pain level of 0 throughout and a pain level of 0 Peggs, Naven C. ([id number]) following the procedure. Post  Debridement Measurements: 5.3cm length x 2.8cm width x 0.3cm depth; 3.497cm^3 volume. Character of Wound/Ulcer Post Debridement requires further debridement. Severity of Tissue Post Debridement is: Fat layer exposed. 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 There was a Skin/Subcutaneous Tissue Debridement ([id number]) debridement with total area of 15.12 sq cm performed by Bonnell Public, NP. with the following instrument(s): Curette to remove Viable and Non-Viable tissue/material including Exudate, Fibrin/Slough, and Subcutaneous after achieving pain control using Lidocaine 4% Topical Solution. A time out was conducted at 08:29, prior to the start of the procedure. A Minimum amount of bleeding was controlled with Pressure. The procedure was tolerated well with a pain level of 0 throughout and a pain level of 0 following the procedure. Post Debridement Measurements: 3.6cm length x 4.2cm width x 0.3cm depth; 3.563cm^3 volume. Character of Wound/Ulcer Post Debridement requires further debridement. Severity of Tissue Post Debridement is: Fat layer exposed. Post procedure Diagnosis Wound #2: Same as Pre-Procedure Plan Wound Cleansing: Wound #1 Right,Lateral Malleolus: Clean wound with Normal Saline. Cleanse wound with mild soap and water May Shower, gently pat wound dry prior to applying new dressing. Wound #2 Right,Medial Malleolus: Clean wound with Normal Saline. Cleanse wound with  mild soap and water May Shower, gently pat wound dry prior to applying new dressing. Anesthetic (add to Medication List): Wound #1 Right,Lateral Malleolus: Topical Lidocaine 4% cream applied to wound bed prior to debridement (In Clinic Only). Wound #2 Right,Medial Malleolus: Topical Lidocaine 4% cream applied to wound bed prior to  debridement (In Clinic Only). Skin Barriers/Peri-Wound Care: Wound #1 Right,Lateral Malleolus: Barrier cream Wound #2 Right,Medial Malleolus: Barrier cream Primary Wound Dressing: Wound #1 Right,Lateral Malleolus: Other: - Dakins Solution Wound #2 Right,Medial Malleolus: Other: - Dakins Solution Secondary Dressing: Wound #1 Right,Lateral Malleolus: ABD pad Conform/Kerlix XtraSorb Wound #2 Right,Medial Malleolus: ABD pad Conform/Kerlix XtraSorb Dressing Change FrequencyJOLAN, [name] ([id number]) Wound #1 Right,Lateral Malleolus: Change dressing every other day. Wound #2 Right,Medial Malleolus: Change dressing every other day. Follow-up Appointments: Wound #1 Right,Lateral Malleolus: Return Appointment in 1 week. Wound #2 Right,Medial Malleolus: Return Appointment in 1 week. Edema Control: Wound #1 Right,Lateral Malleolus: Elevate legs to the level of the heart and pump ankles as often as possible Wound #2 Right,Medial Malleolus: Elevate legs to the level of the heart and pump ankles as often as possible Additional Orders / Instructions: Wound #1 Right,Lateral Malleolus: Increase protein intake. Wound #2 Right,Medial Malleolus: Increase protein intake. Medications-please add to medication list.: Wound #1 Right,Lateral Malleolus: Other: - Vitamin C, Zinc, MVI Wound #2 Right,Medial Malleolus: Other: - Vitamin C, Zinc, MVI The following medication(s) was prescribed: lidocaine topical 4 % cream 1 1 cream topical was prescribed at facility 1. Complete Bactrim as ordered 2. Continue with Dakin's wet-to-dry 3. Follow-up next week Electronic Signature(s) Signed: 09/24/2017 4:14:39 PM By: Bonnell Public Entered By: Bonnell Public on 09/24/2017 08:44:37 [name] ([id number]) -------------------------------------------------------------------------------- ROS/PFSH Details Patient Name: [name] Date of Service: 09/24/2017 8:45 AM Medical Record  Number: [id number] Patient Account Number: [id number] Date of Birth/Sex: 08-21-1946 (72 y.o. Male) Treating RN: Ashok Cordia, [name] Primary Care Provider: [name] Other Clinician: Referring Provider: [name] Treating Provider/Extender: Kathreen Cosier in Treatment: 61 Information Obtained From Patient Wound History Do you currently have one or more open woundso Yes How many open wounds do you currently haveo 1 Approximately how long have you had your woundso 5 months How have you been treating your wound(s) until nowo saline, dressing Has your wound(s) ever healed and then re-openedo Yes Have you had any lab work done in the past montho No Have you tested positive for an antibiotic resistant organism (MRSA, VRE)o No Have you tested positive for osteomyelitis (bone infection)o No Have you had any tests for circulation on your legso No Constitutional Symptoms (General Health) Medical History: Past Medical History Notes: Vein Filter (groin area); CODA; H/O Blood Clots; pulmonary hypertensive arterial disease Eyes Medical History: Positive for: Cataracts - removed Negative for: Glaucoma; Optic Neuritis Ear/Nose/Mouth/Throat Medical History: Negative for: Chronic sinus problems/congestion Hematologic/Lymphatic Medical History: Negative for: Anemia; Hemophilia; Human Immunodeficiency Virus; Lymphedema; Sickle Cell Disease Respiratory Medical History: Positive for: Chronic Obstructive Pulmonary Disease (COPD) Negative for: Aspiration; Asthma; Pneumothorax; Sleep Apnea; Tuberculosis Cardiovascular Medical History: Negative for: Angina; Arrhythmia; Congestive Heart Failure; Coronary Artery Disease; Deep Vein Thrombosis; Hypertension; Hypotension; Myocardial Infarction; Peripheral Arterial Disease; Peripheral Venous Disease; Phlebitis; Vasculitis Gastrointestinal [name], KNAUS. ([id number]) Medical History: Negative for: Cirrhosis ; Colitis; Crohnos; Hepatitis A;  Hepatitis B; Hepatitis C Endocrine Medical History: Negative for: Type I Diabetes; Type II Diabetes Genitourinary Medical History: Negative for: End Stage Renal Disease Immunological Medical History: Negative for: Lupus Erythematosus; Raynaudos Integumentary (Skin) Medical History: Negative for: History of  Burn; History of pressure wounds Musculoskeletal Medical History: Positive for: Osteoarthritis Negative for: Gout; Rheumatoid Arthritis; Osteomyelitis Neurologic Medical History: Negative for: Dementia; Neuropathy; Quadriplegia; Paraplegia; Seizure Disorder Oncologic Medical History: Negative for: Received Chemotherapy; Received Radiation Psychiatric Medical History: Negative for: Anorexia/bulimia; Confinement Anxiety HBO Extended History Items Eyes: Cataracts Immunizations Pneumococcal Vaccine: Received Pneumococcal Vaccination: No Implantable Devices Family and Social History Former smoker - 10 years ago; Marital Status - Married; Alcohol Use: Moderate; Drug Use: No History; Caffeine Use: Never; Advanced Directives: No; Patient does not want information on Advanced Directives; Living Will: No; Medical Power of Attorney: No TOME, WILSON ([id number]) Physician Affirmation I have reviewed and agree with the above information. Electronic Signature(s) Signed: 09/24/2017 4:14:39 PM By: Bonnell Public Signed: 09/24/2017 4:26:07 PM By: Alejandro Mulling Entered By: Bonnell Public on 09/24/2017 08:43:25 [name] ([id number]) -------------------------------------------------------------------------------- SuperBill Details Patient Name: [name] Date of Service: 09/24/2017 Medical Record Number: [id number] Patient Account Number: [id number] Date of Birth/Sex: [birth year] (72 y.o. 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 and decellularized allodermis, initial encounter T81.31XD Disruption of external operation (surgical) wound, not elsewhere classified, subsequent encounter L97.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 of skin Facility Procedures CPT4: Description Modifier Quantity Code 08657846 11042 - DEB SUBQ TISSUE 20 SQ CM/< 1 ICD-10 Diagnosis Description 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 Chronic venous hypertension (idiopathic) with ulcer and inflammation of right lower extremity L97.212 Non-pressure chronic ulcer  of right calf with fat layer exposed T85.613A 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, initial encounter CPT4: 96295284 11045 - DEB SUBQ TISS EA ADDL 20CM 1 ICD-10 Diagnosis Description 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 Chronic venous hypertension (idiopathic) with ulcer and inflammation of right lower extremity L97.212 Non-pressure chronic ulcer of right calf with fat layer exposed  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 ([id number]) Electronic Signature(s) Signed: 09/24/2017  4:14:39 PM By: Bonnell Public Entered By: Bonnell Public on 09/24/2017 08:46:31

## 2017-09-25 NOTE — Progress Notes (Signed)
[name], [name] ([id number]) Visit Report for 09/24/2017 Arrival Information Details Patient Name: [name], [name]. Date of Service: 09/24/2017 8:45 AM Medical Record Number: [id number] Patient Account Number: [id number] Date of Birth/Sex: 09/13/1945 (72 y.o. Male) Treating RN: [name] Primary Care Amariah Kierstead: [name] Other Clinician: Referring Jenesa Foresta: [name] Treating Luian Schumpert/Extender: Kathreen Cosier in Treatment: 20 Visit Information History Since Last Visit All ordered tests and consults were completed: No Patient Arrived: Ambulatory Added or deleted any medications: No Arrival Time: 08:18 Any new allergies or adverse reactions: No Accompanied By: self Had a fall or experienced change in No Transfer Assistance: None activities of daily living that may affect Patient Identification Verified: Yes risk of falls: Secondary Verification Process Yes Signs or symptoms of abuse/neglect since last visito No Completed: Hospitalized since last visit: No Patient Requires Transmission-Based No Has Dressing in Place as Prescribed: Yes Precautions: Pain Present Now: No Patient Has Alerts: Yes Patient Alerts: Patient on Blood Thinner Electronic Signature(s) Signed: 09/24/2017 4:26:07 PM By: Alejandro Mulling Entered By: Alejandro Mulling on 09/24/2017 08:18:47 [name] ([id number]) -------------------------------------------------------------------------------- Encounter Discharge Information Details Patient Name: [name] Date of Service: 09/24/2017 8:45 AM Medical Record Number: [id number] Patient Account Number: [id number] Date of Birth/Sex: 06/04/1946 (72 y.o. Male) Treating RN: [name] Primary Care Kaelob Persky: [name] Other Clinician: Referring Manisha Cancel: [name] Treating Ephram Kornegay/Extender: Kathreen Cosier in Treatment: 56 Encounter Discharge Information Items Discharge Pain Level: 0 Discharge Condition:  Stable Ambulatory Status: Ambulatory Discharge Destination: Home Private Transportation: Auto Accompanied By: self Schedule Follow-up Appointment: Yes Medication Reconciliation completed and provided No to Patient/Care Madeeha Costantino: Clinical Summary of Care: Electronic Signature(s) Signed: 09/24/2017 4:26:07 PM By: Alejandro Mulling Entered By: Alejandro Mulling on 09/24/2017 08:34:40 [name] ([id number]) -------------------------------------------------------------------------------- Multi Wound Chart Details Patient Name: [name] Date of Service: 09/24/2017 8:45 AM Medical Record Number: [id number] Patient Account Number: [id number] Date of Birth/Sex: 11/10/1945 (72 y.o. Male) Treating RN: Ashok Cordia, [name] Primary Care Vianne Grieshop: [name] Other Clinician: Referring Johari Bennetts: [name] Treating Aalijah Mims/Extender: Kathreen Cosier in Treatment: 55 Vital Signs Height(in): 76 Pulse(bpm): 90 Weight(lbs): 238 Blood Pressure(mmHg): 159/97 Body Mass Index(BMI): 29 Temperature(F): 99.4 Respiratory Rate 18 (breaths/min): Photos: [1:No Photos] [2:No Photos] [N/A:N/A] Wound Location: [1:Right Malleolus - Lateral] [2:Right Malleolus - Medial] [N/A:N/A] Wounding Event: [1:Gradually Appeared] [2:Gradually Appeared] [N/A:N/A] Primary Etiology: [1:Venous Leg Ulcer] [2:Venous Leg Ulcer] [N/A:N/A] Comorbid History: [1:Cataracts, Chronic Obstructive Cataracts, Chronic Obstructive N/A Pulmonary Disease (COPD), Pulmonary Disease (COPD), Osteoarthritis] [2:Osteoarthritis] Date Acquired: [1:08/11/2015] [2:01/30/2016] [N/A:N/A] Weeks of Treatment: [1:88] [2:86] [N/A:N/A] Wound Status: [1:Open] [2:Open] [N/A:N/A] Clustered Wound: [1:No] [2:Yes] [N/A:N/A] Measurements L x W x D [1:5.3x2.8x0.2] [2:3.6x4.2x0.2] [N/A:N/A] (cm) Area (cm) : [1:11.655] [2:11.875] [N/A:N/A] Volume (cm) : [1:2.331] [2:2.375] [N/A:N/A] % Reduction in Area: [1:32.50%] [2:-2.90%] [N/A:N/A] %  Reduction in Volume: [1:55.00%] [2:-105.60%] [N/A:N/A] Classification: [1:Full Thickness Without Exposed Support Structures] [2:Full Thickness Without Exposed Support Structures] [N/A:N/A] Exudate Amount: [1:Large] [2:Large] [N/A:N/A] Exudate Type: [1:Serous] [2:Serous] [N/A:N/A] Exudate Color: [1:amber] [2:amber] [N/A:N/A] Wound Margin: [1:Distinct, outline attached] [2:Distinct, outline attached] [N/A:N/A] Granulation Amount: [1:Small (1-33%)] [2:Small (1-33%)] [N/A:N/A] Granulation Quality: [1:N/A] [2:Red] [N/A:N/A] Necrotic Amount: [1:Large (67-100%)] [2:Large (67-100%)] [N/A:N/A] Exposed Structures: [1:Fat Layer (Subcutaneous Tissue) Exposed: Yes Fascia: No Tendon: No Muscle: No Joint: No Bone: No] [2:Fat Layer (Subcutaneous Tissue) Exposed: Yes Fascia: No Tendon: No Muscle: No Joint: No Bone: No] [N/A:N/A] Epithelialization: [1:None] [2:None] [N/A:N/A] Debridement: [1:Debridement (11042-11047)] [2:Debridement (11042-11047)] [N/A:N/A] Pre-procedure [1:08:29] [2:08:29] [N/A:N/A] Verification/Time Out Taken: [name], [name] ([id number]) Pain Control: Lidocaine  4% Topical Solution Lidocaine 4% Topical Solution N/A Tissue Debrided: Fibrin/Slough, Exudates, Fibrin/Slough, Exudates, N/A Subcutaneous Subcutaneous Level: Skin/Subcutaneous Tissue Skin/Subcutaneous Tissue N/A Debridement Area (sq cm): 14.84 15.12 N/A Instrument: Curette Curette N/A Bleeding: Minimum Minimum N/A Hemostasis Achieved: Pressure Pressure N/A Procedural Pain: 0 0 N/A Post Procedural Pain: 0 0 N/A Debridement Treatment Procedure was tolerated well Procedure was tolerated well N/A Response: Post Debridement 5.3x2.8x0.3 3.6x4.2x0.3 N/A Measurements L x W x D (cm) Post Debridement Volume: 3.497 3.563 N/A (cm) Periwound Skin Texture: Scarring: Yes Scarring: Yes N/A Excoriation: No Excoriation: No Induration: No Induration: No Callus: No Callus: No Crepitus: No Crepitus: No Rash: No Rash:  No Periwound Skin Moisture: Maceration: Yes Maceration: Yes N/A Dry/Scaly: No Dry/Scaly: No Periwound Skin Color: Erythema: Yes Erythema: Yes N/A Hemosiderin Staining: Yes Hemosiderin Staining: Yes Atrophie Blanche: No Atrophie Blanche: No Cyanosis: No Cyanosis: No Ecchymosis: No Ecchymosis: No Mottled: No Mottled: No Pallor: No Pallor: No Rubor: No Rubor: No Erythema Location: Circumferential Circumferential N/A Temperature: No Abnormality No Abnormality N/A Tenderness on Palpation: Yes Yes N/A Wound Preparation: Ulcer Cleansing: Ulcer Cleansing: N/A Rinsed/Irrigated with Saline Rinsed/Irrigated with Saline Topical Anesthetic Applied: Topical Anesthetic Applied: Other: lidocaine 4% Other: lidocaine 4% Procedures Performed: Debridement Debridement N/A Treatment Notes Wound #1 (Right, Lateral Malleolus) 1. Cleansed with: Clean wound with Normal Saline 2. Anesthetic Topical Lidocaine 4% cream to wound bed prior to debridement 3. Peri-wound Care: Barrier cream 4. Dressing Applied: Other dressing (specify in notes) 5. Secondary Dressing Applied ABD Pad Dry Gauze [name], [name] Kerlix/Conform Notes dakins solution Wound #2 (Right, Medial Malleolus) 1. Cleansed with: Clean wound with Normal Saline 2. Anesthetic Topical Lidocaine 4% cream to wound bed prior to debridement 3. Peri-wound Care: Barrier cream 4. Dressing Applied: Other dressing (specify in notes) 5. Secondary Dressing Applied ABD Pad Dry Gauze Kerlix/Conform Notes dakins solution Electronic Signature(s) Signed: 09/24/2017 4:14:39 PM By: Bonnell Public Entered By: Bonnell Public on 09/24/2017 08:41:56 [name] ([id number]) -------------------------------------------------------------------------------- Multi-Disciplinary Care Plan Details Patient Name: [name] Date of Service: 09/24/2017 8:45 AM Medical Record Number: [id number] Patient Account  Number: [id number] Date of Birth/Sex: 1945-09-05 (72 y.o. Male) Treating RN: [name] Primary Care Broadus Costilla: [name] Other Clinician: Referring Adis Sturgill: [name] Treating Guy Toney/Extender: Kathreen Cosier in Treatment: 10 Active Inactive ` Venous Leg Ulcer Nursing Diagnoses: Knowledge deficit related to disease process and management Goals: Patient will maintain optimal edema control Date Initiated: 04/03/2016 Target Resolution Date: 11/24/2016 Goal Status: Active Interventions: Compression as ordered Treatment Activities: Therapeutic compression applied : 04/03/2016 Notes: ` Wound/Skin Impairment Nursing Diagnoses: Impaired tissue integrity Goals: Ulcer/skin breakdown will heal within 14 weeks Date Initiated: 01/17/2016 Target Resolution Date: 11/24/2016 Goal Status: Active Interventions: Assess ulceration(s) every visit Notes: Electronic Signature(s) Signed: 09/24/2017 4:26:07 PM By: Alejandro Mulling Entered By: Alejandro Mulling on 09/24/2017 08:30:54 [name] ([id number]) -------------------------------------------------------------------------------- Pain Assessment Details Patient Name: [name] Date of Service: 09/24/2017 8:45 AM Medical Record Number: [id number] Patient Account Number: [id number] Date of Birth/Sex: [birth year] (72 y.o. Male) Treating RN: [name] Primary Care Berthe Oley: [name] Other Clinician: Referring Valena Ivanov: [name] Treating Audia Amick/Extender: Kathreen Cosier in Treatment: 66 Active Problems Location of Pain Severity and Description of Pain Patient Has Paino No Site Locations Pain Management and Medication Current Pain Management: Electronic Signature(s) Signed: 09/24/2017 4:26:07 PM By: Alejandro Mulling Entered By: Alejandro Mulling on 09/24/2017 08:18:52 [name]  ([id number]) -------------------------------------------------------------------------------- Patient/Caregiver Education Details Patient Name: [name] Date of Service:  09/24/2017 8:45 AM Medical Record Number: [id number] Patient Account Number: [id number] Date of Birth/Gender: 11/25/1945 (72 y.o. Male) Treating RN: [name] Primary Care Physician: [name] Other Clinician: Referring Physician: [name] Treating Physician/Extender: Kathreen Cosier in Treatment: 48 Education Assessment Education Provided To: Patient Education Topics Provided Wound/Skin Impairment: Handouts: Caring for Your Ulcer, Other: change dressing as ordered Methods: Demonstration, Explain/Verbal Responses: State content correctly Electronic Signature(s) Signed: 09/24/2017 4:26:07 PM By: Alejandro Mulling Entered By: Alejandro Mulling on 09/24/2017 08:34:32 [name] ([id number]) -------------------------------------------------------------------------------- Wound Assessment Details Patient Name: [name] Date of Service: 09/24/2017 8:45 AM Medical Record Number: [id number] Patient Account Number: [id number] Date of Birth/Sex: 02-05-1946 (72 y.o. Male) Treating RN: Ashok Cordia, [name] Primary Care Lionardo Haze: [name] Other Clinician: Referring Ginger Leeth: [name] Treating Jonnatan Hanners/Extender: Kathreen Cosier in Treatment: 88 Wound Status Wound Number: 1 Primary Venous Leg Ulcer Etiology: Wound Location: Right Malleolus - Lateral Wound Open Wounding Event: Gradually Appeared Status: Date Acquired: 08/11/2015 Comorbid Cataracts, Chronic Obstructive Pulmonary Weeks Of Treatment: 88 History: Disease (COPD), Osteoarthritis Clustered Wound: No Photos Photo Uploaded By: Alejandro Mulling on 09/24/2017 16:47:41 Wound Measurements Length: (cm) 5.3 Width: (cm) 2.8 Depth: (cm) 0.2 Area: (cm) 11.655 Volume: (cm) 2.331 % Reduction in Area: 32.5% %  Reduction in Volume: 55% Epithelialization: None Tunneling: No Undermining: No Wound Description Full Thickness Without Exposed Support Classification: Structures Wound Margin: Distinct, outline attached Exudate Large Amount: Exudate Type: Serous Exudate Color: amber Foul Odor After Cleansing: No Slough/Fibrino Yes Wound Bed Granulation Amount: Small (1-33%) Exposed Structure Necrotic Amount: Large (67-100%) Fascia Exposed: No Necrotic Quality: Adherent Slough Fat Layer (Subcutaneous Tissue) Exposed: Yes Tendon Exposed: No Muscle Exposed: No Joint Exposed: No Bone Exposed: No [name], [name] ([id number]) Periwound Skin Texture Texture Color No Abnormalities Noted: No No Abnormalities Noted: No Callus: No Atrophie Blanche: No Crepitus: No Cyanosis: No Excoriation: No Ecchymosis: No Induration: No Erythema: Yes Rash: No Erythema Location: Circumferential Scarring: Yes Hemosiderin Staining: Yes Mottled: No Moisture Pallor: No No Abnormalities Noted: No Rubor: No Dry / Scaly: No Maceration: Yes Temperature / Pain Temperature: No Abnormality Tenderness on Palpation: Yes Wound Preparation Ulcer Cleansing: Rinsed/Irrigated with Saline Topical Anesthetic Applied: Other: lidocaine 4%, Treatment Notes Wound #1 (Right, Lateral Malleolus) 1. Cleansed with: Clean wound with Normal Saline 2. Anesthetic Topical Lidocaine 4% cream to wound bed prior to debridement 3. Peri-wound Care: Barrier cream 4. Dressing Applied: Other dressing (specify in notes) 5. Secondary Dressing Applied ABD Pad Dry Gauze Kerlix/Conform Notes dakins solution Electronic Signature(s) Signed: 09/24/2017 4:26:07 PM By: Alejandro Mulling Entered By: Alejandro Mulling on 09/24/2017 08:26:18 [name] ([id number]) -------------------------------------------------------------------------------- Wound Assessment Details Patient Name: [name] Date of Service:  09/24/2017 8:45 AM Medical Record Number: [id number] Patient Account Number: [id number] Date of Birth/Sex: March 10, 1946 (72 y.o. Male) Treating RN: Ashok Cordia, [name] Primary Care Laurenashley Viar: [name] Other Clinician: Referring Anela Bensman: [name] Treating Demir Titsworth/Extender: Kathreen Cosier in Treatment: 88 Wound Status Wound Number: 2 Primary Venous Leg Ulcer Etiology: Wound Location: Right Malleolus - Medial Wound Open Wounding Event: Gradually Appeared Status: Date Acquired: 01/30/2016 Comorbid Cataracts, Chronic Obstructive Pulmonary Weeks Of Treatment: 86 History: Disease (COPD), Osteoarthritis Clustered Wound: Yes Photos Photo Uploaded By: Alejandro Mulling on 09/24/2017 16:47:42 Wound Measurements Length: (cm) 3.6 Width: (cm) 4.2 Depth: (cm) 0.2 Area: (cm) 11.875 Volume: (cm) 2.375 % Reduction in Area: -2.9% % Reduction in Volume: -105.6% Epithelialization: None Tunneling: No Undermining: No Wound Description Full Thickness Without Exposed Support Classification: Structures Wound Margin: Distinct,  outline attached Exudate Large Amount: Exudate Type: Serous Exudate Color: amber Foul Odor After Cleansing: No Slough/Fibrino Yes Wound Bed Granulation Amount: Small (1-33%) Exposed Structure Granulation Quality: Red Fascia Exposed: No Necrotic Amount: Large (67-100%) Fat Layer (Subcutaneous Tissue) Exposed: Yes Necrotic Quality: Adherent Slough Tendon Exposed: No Muscle Exposed: No Joint Exposed: No Bone Exposed: No [name], [name] ([id number]) Periwound Skin Texture Texture Color No Abnormalities Noted: No No Abnormalities Noted: No Callus: No Atrophie Blanche: No Crepitus: No Cyanosis: No Excoriation: No Ecchymosis: No Induration: No Erythema: Yes Rash: No Erythema Location: Circumferential Scarring: Yes Hemosiderin Staining: Yes Mottled: No Moisture Pallor: No No Abnormalities Noted: No Rubor: No Dry / Scaly: No Maceration: Yes  Temperature / Pain Temperature: No Abnormality Tenderness on Palpation: Yes Wound Preparation Ulcer Cleansing: Rinsed/Irrigated with Saline Topical Anesthetic Applied: Other: lidocaine 4%, Treatment Notes Wound #2 (Right, Medial Malleolus) 1. Cleansed with: Clean wound with Normal Saline 2. Anesthetic Topical Lidocaine 4% cream to wound bed prior to debridement 3. Peri-wound Care: Barrier cream 4. Dressing Applied: Other dressing (specify in notes) 5. Secondary Dressing Applied ABD Pad Dry Gauze Kerlix/Conform Notes dakins solution Electronic Signature(s) Signed: 09/24/2017 4:26:07 PM By: Alejandro MullingPinkerton, Debra Entered By: Alejandro MullingPinkerton, Debra on 09/24/2017 08:28:19 [name] StackBLACKWELL, [name] ([id number]) -------------------------------------------------------------------------------- Vitals Details Patient Name: [name] StackBLACKWELL, [name] Date of Service: 09/24/2017 8:45 AM Medical Record Number: [id number] Patient Account Number: [id number] Date of Birth/Sex: September 23, 1945 [age] y.o. Male) Treating RN: Ashok CordiaPinkerton, [name] Primary Care Tery Hoeger: Darreld McleanMILES, LINDA Other Clinician: Referring Aryn Kops: Darreld McleanMILES, LINDA Treating Osiris Charles/Extender: Kathreen Cosieroulter, Leah Weeks in Treatment: 2988 Vital Signs Time Taken: 08:18 Temperature (F): 99.4 Height (in): 76 Pulse (bpm): 90 Weight (lbs): 238 Respiratory Rate (breaths/min): 18 Body Mass Index (BMI): 29 Blood Pressure (mmHg): 159/97 Reference Range: 80 - 120 mg / dl Electronic Signature(s) Signed: 09/24/2017 4:26:07 PM By: Alejandro MullingPinkerton, Debra Entered By: Alejandro MullingPinkerton, Debra on 09/24/2017 08:21:27

## 2017-10-01 ENCOUNTER — Encounter: Payer: Medicare HMO | Attending: Physician Assistant | Admitting: Physician Assistant

## 2017-10-01 DIAGNOSIS — L97312 Non-pressure chronic ulcer of right ankle with fat layer exposed: Secondary | ICD-10-CM | POA: Diagnosis not present

## 2017-10-01 DIAGNOSIS — Y832 Surgical operation with anastomosis, bypass or graft as the cause of abnormal reaction of the patient, or of later complication, without mention of misadventure at the time of the procedure: Secondary | ICD-10-CM | POA: Diagnosis not present

## 2017-10-01 DIAGNOSIS — Z87891 Personal history of nicotine dependence: Secondary | ICD-10-CM | POA: Diagnosis not present

## 2017-10-01 DIAGNOSIS — Y839 Surgical procedure, unspecified as the cause of abnormal reaction of the patient, or of later complication, without mention of misadventure at the time of the procedure: Secondary | ICD-10-CM | POA: Insufficient documentation

## 2017-10-01 DIAGNOSIS — I87331 Chronic venous hypertension (idiopathic) with ulcer and inflammation of right lower extremity: Secondary | ICD-10-CM | POA: Diagnosis present

## 2017-10-01 DIAGNOSIS — T85613A Breakdown (mechanical) of artificial skin graft and decellularized allodermis, initial encounter: Secondary | ICD-10-CM | POA: Insufficient documentation

## 2017-10-01 DIAGNOSIS — T8131XA Disruption of external operation (surgical) wound, not elsewhere classified, initial encounter: Secondary | ICD-10-CM | POA: Insufficient documentation

## 2017-10-01 DIAGNOSIS — J449 Chronic obstructive pulmonary disease, unspecified: Secondary | ICD-10-CM | POA: Diagnosis not present

## 2017-10-02 ENCOUNTER — Ambulatory Visit (HOSPITAL_COMMUNITY)
Admission: RE | Admit: 2017-10-02 | Discharge: 2017-10-02 | Disposition: A | Discharge status: Home / Self Care | Payer: Medicare HMO | Source: Ambulatory Visit | Attending: Vascular Surgery | Admitting: Vascular Surgery

## 2017-10-02 ENCOUNTER — Ambulatory Visit (INDEPENDENT_AMBULATORY_CARE_PROVIDER_SITE_OTHER): Payer: Medicare HMO | Admitting: Vascular Surgery

## 2017-10-02 ENCOUNTER — Other Ambulatory Visit: Payer: Self-pay

## 2017-10-02 ENCOUNTER — Encounter: Payer: Self-pay | Admitting: Vascular Surgery

## 2017-10-02 VITALS — BP 122/72 | HR 85 | Temp 99.1°F | Resp 18 | Ht 76.0 in | Wt 217.0 lb

## 2017-10-02 DIAGNOSIS — L97311 Non-pressure chronic ulcer of right ankle limited to breakdown of skin: Secondary | ICD-10-CM | POA: Diagnosis not present

## 2017-10-02 DIAGNOSIS — I83013 Varicose veins of right lower extremity with ulcer of ankle: Secondary | ICD-10-CM

## 2017-10-02 DIAGNOSIS — I8311 Varicose veins of right lower extremity with inflammation: Secondary | ICD-10-CM | POA: Diagnosis present

## 2017-10-02 NOTE — Progress Notes (Signed)
[name] ([id number]) Visit Report for 10/01/2017 Arrival Information Details Patient Name: [name], [name]. Date of Service: 10/01/2017 3:15 PM Medical Record Number: [id number] Patient Account Number: [id number] Date of Birth/Sex: Nov 09, 1945 (72 y.o. Male) Treating RN: Ashok Cordia, [name] Primary Care Mishael Krysiak: [name] Other Clinician: Referring Karandeep Resende: [name] Treating Carleena Mires/Extender: Linwood Dibbles, HOYT Weeks in Treatment: 89 Visit Information History Since Last Visit All ordered tests and consults were completed: No Patient Arrived: Ambulatory Added or deleted any medications: No Arrival Time: 15:26 Any new allergies or adverse reactions: No Accompanied By: self Had a fall or experienced change in No Transfer Assistance: None activities of daily living that may affect Patient Identification Verified: Yes risk of falls: Secondary Verification Process No Signs or symptoms of abuse/neglect since last visito No Completed: Hospitalized since last visit: No Patient Requires Transmission-Based No Has Dressing in Place as Prescribed: Yes Precautions: Pain Present Now: No Patient Has Alerts: Yes Patient Alerts: Patient on Blood Thinner Electronic Signature(s) Signed: 10/01/2017 4:26:31 PM By: Alejandro Mulling Entered By: Alejandro Mulling on 10/01/2017 15:28:30 [name] ([id number]) -------------------------------------------------------------------------------- Encounter Discharge Information Details Patient Name: [name] Date of Service: 10/01/2017 3:15 PM Medical Record Number: [id number] Patient Account Number: [id number] Date of Birth/Sex: 1945-09-07 (72 y.o. Male) Treating RN: [name] Primary Care Hagan Maltz: [name] Other Clinician: Referring Zamari Bonsall: [name] Treating Quantarius Genrich/Extender: STONE III, HOYT Weeks in Treatment: 56 Encounter Discharge Information Items Discharge Pain Level: 0 Discharge Condition:  Stable Ambulatory Status: Ambulatory Discharge Destination: Home Private Transportation: Auto Accompanied By: self Schedule Follow-up Appointment: Yes Medication Reconciliation completed and provided No to Patient/Care Allesha Aronoff: Clinical Summary of Care: Electronic Signature(s) Signed: 10/01/2017 4:15:44 PM By: Alejandro Mulling Entered By: Alejandro Mulling on 10/01/2017 16:15:44 [name] ([id number]) -------------------------------------------------------------------------------- Lower Extremity Assessment Details Patient Name: [name] Date of Service: 10/01/2017 3:15 PM Medical Record Number: [id number] Patient Account Number: [id number] Date of Birth/Sex: 1945-09-03 (72 y.o. Male) Treating RN: [name] Primary Care Duell Holdren: [name] Other Clinician: Referring Giuseppina Quinones: [name] Treating Amiera Herzberg/Extender: STONE III, HOYT Weeks in Treatment: 89 Vascular Assessment Pulses: Dorsalis Pedis Palpable: [Right:Yes] Posterior Tibial Extremity colors, hair growth, and conditions: Extremity Color: [Right:Normal] Temperature of Extremity: [Right:Warm] Capillary Refill: [Right:< 3 seconds] Toe Nail Assessment Left: Right: Thick: Yes Discolored: Yes Deformed: Yes Improper Length and Hygiene: No Electronic Signature(s) Signed: 10/01/2017 4:26:31 PM By: Alejandro Mulling Entered By: Alejandro Mulling on 10/01/2017 15:37:33 [name] ([id number]) -------------------------------------------------------------------------------- Multi Wound Chart Details Patient Name: [name] Date of Service: 10/01/2017 3:15 PM Medical Record Number: [id number] Patient Account Number: [id number] Date of Birth/Sex: May 03, 1946 (72 y.o. Male) Treating RN: [name] Primary Care Kynadi Dragos: [name] Other Clinician: Referring Shawnte Demarest: [name] Treating Annalysse Shoemaker/Extender: STONE III, HOYT Weeks in Treatment: 89 Vital Signs Height(in):  76 Pulse(bpm): 86 Weight(lbs): 238 Blood Pressure(mmHg): 121/69 Body Mass Index(BMI): 29 Temperature(F): 98.5 Respiratory Rate 18 (breaths/min): Photos: [1:No Photos] [2:No Photos] [N/A:N/A] Wound Location: [1:Right Malleolus - Lateral] [2:Right Malleolus - Medial] [N/A:N/A] Wounding Event: [1:Gradually Appeared] [2:Gradually Appeared] [N/A:N/A] Primary Etiology: [1:Venous Leg Ulcer] [2:Venous Leg Ulcer] [N/A:N/A] Comorbid History: [1:Cataracts, Chronic Obstructive Cataracts, Chronic Obstructive N/A Pulmonary Disease (COPD), Pulmonary Disease (COPD), Osteoarthritis] [2:Osteoarthritis] Date Acquired: [1:08/11/2015] [2:01/30/2016] [N/A:N/A] Weeks of Treatment: [1:89] [2:87] [N/A:N/A] Wound Status: [1:Open] [2:Open] [N/A:N/A] Clustered Wound: [1:No] [2:Yes] [N/A:N/A] Measurements L x W x D [1:5.1x2.7x0.2] [2:3.6x3.8x0.2] [N/A:N/A] (cm) Area (cm) : [1:10.815] [2:10.744] [N/A:N/A] Volume (cm) : [1:2.163] [2:2.149] [N/A:N/A] % Reduction in Area: [1:37.40%] [2:6.90%] [N/A:N/A] % Reduction  in Volume: [1:58.30%] [2:-86.10%] [N/A:N/A] Classification: [1:Full Thickness Without Exposed Support Structures] [2:Full Thickness Without Exposed Support Structures] [N/A:N/A] Exudate Amount: [1:Large] [2:Large] [N/A:N/A] Exudate Type: [1:Serous] [2:Serous] [N/A:N/A] Exudate Color: [1:amber] [2:amber] [N/A:N/A] Wound Margin: [1:Distinct, outline attached] [2:Distinct, outline attached] [N/A:N/A] Granulation Amount: [1:Medium (34-66%)] [2:Medium (34-66%)] [N/A:N/A] Granulation Quality: [1:N/A] [2:Red] [N/A:N/A] Necrotic Amount: [1:Medium (34-66%)] [2:Medium (34-66%)] [N/A:N/A] Exposed Structures: [1:Fat Layer (Subcutaneous Tissue) Exposed: Yes Fascia: No Tendon: No Muscle: No Joint: No Bone: No] [2:Fat Layer (Subcutaneous Tissue) Exposed: Yes Fascia: No Tendon: No Muscle: No Joint: No Bone: No] [N/A:N/A] Epithelialization: [1:None] [2:None] [N/A:N/A] Periwound Skin Texture: [1:Scarring: Yes  Excoriation: No Induration: No] [2:Scarring: Yes Excoriation: No Induration: No] [N/A:N/A] Callus: No Callus: No Crepitus: No Crepitus: No Rash: No Rash: No Periwound Skin Moisture: Maceration: Yes Maceration: Yes N/A Dry/Scaly: No Dry/Scaly: No Periwound Skin Color: Erythema: Yes Erythema: Yes N/A Hemosiderin Staining: Yes Hemosiderin Staining: Yes Atrophie Blanche: No Atrophie Blanche: No Cyanosis: No Cyanosis: No Ecchymosis: No Ecchymosis: No Mottled: No Mottled: No Pallor: No Pallor: No Rubor: No Rubor: No Erythema Location: Circumferential Circumferential N/A Temperature: No Abnormality No Abnormality N/A Tenderness on Palpation: Yes Yes N/A Wound Preparation: Ulcer Cleansing: Ulcer Cleansing: N/A Rinsed/Irrigated with Saline Rinsed/Irrigated with Saline Topical Anesthetic Applied: Topical Anesthetic Applied: Other: lidocaine 4% Other: lidocaine 4% Treatment Notes Electronic Signature(s) Signed: 10/01/2017 4:26:31 PM By: Alejandro MullingPinkerton, Debra Entered By: Alejandro MullingPinkerton, Debra on 10/01/2017 15:38:13 [name] StackBLACKWELL, Jayion C. ([id number]) -------------------------------------------------------------------------------- Multi-Disciplinary Care Plan Details Patient Name: [name] StackBLACKWELL, Dontell C. Date of Service: 10/01/2017 3:15 PM Medical Record Number: [id number] Patient Account Number: [id number] Date of Birth/Sex: 05/28/1946 [age] y.o. Male) Treating RN: Phillis HaggisPinkerton, [name] Primary Care Brunella Wileman: Darreld McleanMILES, LINDA Other Clinician: Referring Toluwanimi Radebaugh: Darreld McleanMILES, LINDA Treating Rogan Ecklund/Extender: STONE III, HOYT Weeks in Treatment: 89 Active Inactive ` Venous Leg Ulcer Nursing Diagnoses: Knowledge deficit related to disease process and management Goals: Patient will maintain optimal edema control Date Initiated: 04/03/2016 Target Resolution Date: 11/24/2016 Goal Status: Active Interventions: Compression as ordered Treatment Activities: Therapeutic compression applied :  04/03/2016 Notes: ` Wound/Skin Impairment Nursing Diagnoses: Impaired tissue integrity Goals: Ulcer/skin breakdown will heal within 14 weeks Date Initiated: 01/17/2016 Target Resolution Date: 11/24/2016 Goal Status: Active Interventions: Assess ulceration(s) every visit Notes: Electronic Signature(s) Signed: 10/01/2017 4:26:31 PM By: Alejandro MullingPinkerton, Debra Entered By: Alejandro MullingPinkerton, Debra on 10/01/2017 15:37:58 [name] StackBLACKWELL, Klint C. ([id number]) -------------------------------------------------------------------------------- Pain Assessment Details Patient Name: [name] StackBLACKWELL, Ithan C. Date of Service: 10/01/2017 3:15 PM Medical Record Number: [id number] Patient Account Number: [id number] Date of Birth/Sex: 05/28/1946 [age] y.o. Male) Treating RN: Phillis HaggisPinkerton, [name] Primary Care Tyler Cubit: Darreld McleanMILES, LINDA Other Clinician: Referring Niyah Mamaril: Darreld McleanMILES, LINDA Treating Rydell Wiegel/Extender: STONE III, HOYT Weeks in Treatment: 89 Active Problems Location of Pain Severity and Description of Pain Patient Has Paino No Site Locations Pain Management and Medication Current Pain Management: Electronic Signature(s) Signed: 10/01/2017 4:26:31 PM By: Alejandro MullingPinkerton, Debra Entered By: Alejandro MullingPinkerton, Debra on 10/01/2017 15:28:46 [name] StackBLACKWELL, Navy C. ([id number]) -------------------------------------------------------------------------------- Patient/Caregiver Education Details Patient Name: [name] StackBLACKWELL, Patricia C. Date of Service: 10/01/2017 3:15 PM Medical Record Number: [id number] Patient Account Number: [id number] Date of Birth/Gender: 05/28/1946 [age] y.o. Male) Treating RN: Phillis HaggisPinkerton, [name] Primary Care Physician: Darreld McleanMILES, LINDA Other Clinician: Referring Physician: Darreld McleanMILES, LINDA Treating Physician/Extender: Lenda KelpSTONE III, HOYT Weeks in Treatment: 5089 Education Assessment Education Provided To: Patient Education Topics Provided Wound/Skin Impairment: Handouts: Caring for Your Ulcer, Other: change dressing as ordered Methods:  Demonstration, Explain/Verbal Responses: State content correctly Electronic Signature(s) Signed: 10/01/2017 4:26:31 PM By: Alejandro MullingPinkerton, Debra Entered By: Alejandro MullingPinkerton, Debra on 10/01/2017 16:16:01 [name] StackBLACKWELL, Duglas C. ([id number]) --------------------------------------------------------------------------------  Wound Assessment Details Patient Name: ASHANTI, LITTLES. Date of Service: 10/01/2017 3:15 PM Medical Record Number: [id number] Patient Account Number: [id number] Date of Birth/Sex: 1946/08/09 (72 y.o. Male) Treating RN: Ashok Cordia, [name] Primary Care Reghan Thul: [name] Other Clinician: Referring Earl Losee: [name] Treating Tacarra Justo/Extender: STONE III, HOYT Weeks in Treatment: 89 Wound Status Wound Number: 1 Primary Venous Leg Ulcer Etiology: Wound Location: Right Malleolus - Lateral Wound Open Wounding Event: Gradually Appeared Status: Date Acquired: 08/11/2015 Comorbid Cataracts, Chronic Obstructive Pulmonary Weeks Of Treatment: 89 History: Disease (COPD), Osteoarthritis Clustered Wound: No Photos Photo Uploaded By: Alejandro Mulling on 10/01/2017 16:33:17 Wound Measurements Length: (cm) 5.1 Width: (cm) 2.7 Depth: (cm) 0.2 Area: (cm) 10.815 Volume: (cm) 2.163 % Reduction in Area: 37.4% % Reduction in Volume: 58.3% Epithelialization: None Tunneling: No Undermining: No Wound Description Full Thickness Without Exposed Support Classification: Structures Wound Margin: Distinct, outline attached Exudate Large Amount: Exudate Type: Serous Exudate Color: amber Foul Odor After Cleansing: No Slough/Fibrino Yes Wound Bed Granulation Amount: Medium (34-66%) Exposed Structure Necrotic Amount: Medium (34-66%) Fascia Exposed: No Necrotic Quality: Adherent Slough Fat Layer (Subcutaneous Tissue) Exposed: Yes Tendon Exposed: No Muscle Exposed: No Joint Exposed: No Bone Exposed: No Gersten, Delynn C. ([id number]) Periwound Skin Texture Texture Color No  Abnormalities Noted: No No Abnormalities Noted: No Callus: No Atrophie Blanche: No Crepitus: No Cyanosis: No Excoriation: No Ecchymosis: No Induration: No Erythema: Yes Rash: No Erythema Location: Circumferential Scarring: Yes Hemosiderin Staining: Yes Mottled: No Moisture Pallor: No No Abnormalities Noted: No Rubor: No Dry / Scaly: No Maceration: Yes Temperature / Pain Temperature: No Abnormality Tenderness on Palpation: Yes Wound Preparation Ulcer Cleansing: Rinsed/Irrigated with Saline Topical Anesthetic Applied: Other: lidocaine 4%, Treatment Notes Wound #1 (Right, Lateral Malleolus) 1. Cleansed with: Clean wound with Normal Saline 2. Anesthetic Topical Lidocaine 4% cream to wound bed prior to debridement 3. Peri-wound Care: Barrier cream 5. Secondary Dressing Applied ABD Pad Dry Gauze Kerlix/Conform 7. Secured with Tape Notes dakins moistened gauze, xtrasorb Electronic Signature(s) Signed: 10/01/2017 4:26:31 PM By: Alejandro Mulling Entered By: Alejandro Mulling on 10/01/2017 15:35:24 [name] ([id number]) -------------------------------------------------------------------------------- Wound Assessment Details Patient Name: [name] Date of Service: 10/01/2017 3:15 PM Medical Record Number: [id number] Patient Account Number: [id number] Date of Birth/Sex: 1945/09/28 (72 y.o. Male) Treating RN: Ashok Cordia, [name] Primary Care Karin Pinedo: [name] Other Clinician: Referring Jeran Hiltz: [name] Treating Lofton Leon/Extender: STONE III, HOYT Weeks in Treatment: 89 Wound Status Wound Number: 2 Primary Venous Leg Ulcer Etiology: Wound Location: Right Malleolus - Medial Wound Open Wounding Event: Gradually Appeared Status: Date Acquired: 01/30/2016 Comorbid Cataracts, Chronic Obstructive Pulmonary Weeks Of Treatment: 87 History: Disease (COPD), Osteoarthritis Clustered Wound: Yes Photos Photo Uploaded By: Alejandro Mulling on 10/01/2017  16:33:18 Wound Measurements Length: (cm) 3.6 Width: (cm) 3.8 Depth: (cm) 0.2 Area: (cm) 10.744 Volume: (cm) 2.149 % Reduction in Area: 6.9% % Reduction in Volume: -86.1% Epithelialization: None Tunneling: No Undermining: No Wound Description Full Thickness Without Exposed Support Classification: Structures Wound Margin: Distinct, outline attached Exudate Large Amount: Exudate Type: Serous Exudate Color: amber Foul Odor After Cleansing: No Slough/Fibrino Yes Wound Bed Granulation Amount: Medium (34-66%) Exposed Structure Granulation Quality: Red Fascia Exposed: No Necrotic Amount: Medium (34-66%) Fat Layer (Subcutaneous Tissue) Exposed: Yes Necrotic Quality: Adherent Slough Tendon Exposed: No Muscle Exposed: No Joint Exposed: No Bone Exposed: No Wilhide, Kelin C. ([id number]) Periwound Skin Texture Texture Color No Abnormalities Noted: No No Abnormalities Noted: No Callus: No Atrophie Blanche: No Crepitus: No Cyanosis: No Excoriation: No Ecchymosis: No Induration: No  Erythema: Yes Rash: No Erythema Location: Circumferential Scarring: Yes Hemosiderin Staining: Yes Mottled: No Moisture Pallor: No No Abnormalities Noted: No Rubor: No Dry / Scaly: No Maceration: Yes Temperature / Pain Temperature: No Abnormality Tenderness on Palpation: Yes Wound Preparation Ulcer Cleansing: Rinsed/Irrigated with Saline Topical Anesthetic Applied: Other: lidocaine 4%, Treatment Notes Wound #2 (Right, Medial Malleolus) 1. Cleansed with: Clean wound with Normal Saline 2. Anesthetic Topical Lidocaine 4% cream to wound bed prior to debridement 3. Peri-wound Care: Barrier cream 5. Secondary Dressing Applied ABD Pad Dry Gauze Kerlix/Conform 7. Secured with Tape Notes dakins moistened gauze, xtrasorb Electronic Signature(s) Signed: 10/01/2017 4:26:31 PM By: Alejandro Mulling Entered By: Alejandro Mulling on 10/01/2017 15:34:03 [name]  ([id number]) -------------------------------------------------------------------------------- Vitals Details Patient Name: [name] Date of Service: 10/01/2017 3:15 PM Medical Record Number: [id number] Patient Account Number: [id number] Date of Birth/Sex: 12-16-1945 (72 y.o. Male) Treating RN: Ashok Cordia, [name] Primary Care Kenidi Elenbaas: [name] Other Clinician: Referring Arlisha Patalano: [name] Treating Loye Vento/Extender: STONE III, HOYT Weeks in Treatment: 89 Vital Signs Time Taken: 15:28 Temperature (F): 98.5 Height (in): 76 Pulse (bpm): 86 Weight (lbs): 238 Respiratory Rate (breaths/min): 18 Body Mass Index (BMI): 29 Blood Pressure (mmHg): 121/69 Reference Range: 80 - 120 mg / dl Electronic Signature(s) Signed: 10/01/2017 4:26:31 PM By: Alejandro Mulling Entered By: Alejandro Mulling on 10/01/2017 15:29:13

## 2017-10-02 NOTE — Progress Notes (Signed)
Requested by:  Martie RoundSpencer, Nicole, NP [address] [place] [id number]  Reason for consultation: chronic right foot venous ulcers    History of Present Illness   [name] is a 72 y.o. ([birth year]) male who presents with chief complaint: non-healing wounds in R leg.  Pt has had a 2 year history of R ankle ulcers.  The patient underwent at outside practice EVLA R GSV (duplicate) and SSV in 09/11/16 for VSU after trial of unna boots.  The patient has not healed his ulcers since then.    The patient has had prior history PE, history of DVT of unknown laterality, known history of varicose vein, known history of venous stasis ulcers, no history of  Lymphedema and known history of skin changes in lower legs.  There is no family history of venous disorders.  The patient has used compression stockings in the past.   Past Medical History:  Diagnosis Date  . Allergy   . DVT (deep venous thrombosis) (HCC)   . Peripheral artery disease (HCC)   . Status post ablation of incompetent vein using laser    Left Leg  . Varicose veins of both lower extremities     Past Surgical History:  Procedure Laterality Date  . CHOLECYSTECTOMY    . FOOT SURGERY Right   . HAND AMPUTATION Left   . IVC FILTER PLACEMENT ([facility] HX)    . SPLENECTOMY, TOTAL      Social History   Socioeconomic History  . Marital status: Married    Spouse name: Not on file  . Number of children: Not on file  . Years of education: Not on file  . Highest education level: Not on file  Social Needs  . Financial resource strain: Not on file  . Food insecurity - worry: Not on file  . Food insecurity - inability: Not on file  . Transportation needs - medical: Not on file  . Transportation needs - non-medical: Not on file  Occupational History  . Not on file  Tobacco Use  . Smoking status: Former Games developermoker  . Smokeless tobacco: Never Used  Substance and Sexual Activity  . Alcohol use: No  . Drug use: No  .  Sexual activity: Not on file  Other Topics Concern  . Not on file  Social History Narrative  . Not on file    Family History: patient is unable to detail the medical history of his parents   Current Outpatient Medications  Medication Sig Dispense Refill  . Cyanocobalamin (B-12) 1000 MCG CAPS Take by mouth.    . folic acid (FOLVITE) 1 MG tablet Take by mouth.    . Macitentan 10 MG TABS Take by mouth.    . Multiple Vitamin (MULTI-VITAMINS) TABS Take by mouth.    . rivaroxaban (XARELTO) 10 MG TABS tablet Take 10 mg by mouth daily.    . tadalafil, PAH, (ADCIRCA) 20 MG tablet Take by mouth.    . ALPRAZolam (XANAX) 0.5 MG tablet     . apixaban (ELIQUIS) 5 MG TABS tablet Take by mouth.    . cetirizine (ZYRTEC) 10 MG tablet Take by mouth.    . Omega-3 Fatty Acids (FISH OIL PO) Take by mouth.     No current facility-administered medications for this visit.     No Known Allergies  REVIEW OF SYSTEMS (negative unless checked):   Cardiac:  []  Chest pain or chest pressure? []  Shortness of breath upon activity? []  Shortness of breath when  lying flat? []  Irregular heart rhythm?  Vascular:  [x]  Pain in calf, thigh, or hip brought on by walking? []  Pain in feet at night that wakes you up from your sleep? []  Blood clot in your veins? []  Leg swelling?  Pulmonary:  []  Oxygen at home? []  Productive cough? []  Wheezing?  Neurologic:  []  Sudden weakness in arms or legs? []  Sudden numbness in arms or legs? []  Sudden onset of difficult speaking or slurred speech? []  Temporary loss of vision in one eye? []  Problems with dizziness?  Gastrointestinal:  []  Blood in stool? []  Vomited blood?  Genitourinary:  []  Burning when urinating? []  Blood in urine?  Psychiatric:  []  Major depression  Hematologic:  []  Bleeding problems? []  Problems with blood clotting?  Dermatologic:  []  Rashes or ulcers?  Constitutional:  []  Fever or chills?  Ear/Nose/Throat:  []  Change in hearing? []   Nose bleeds? []  Sore throat?  Musculoskeletal:  []  Back pain? []  Joint pain? []  Muscle pain?   Physical Examination     Vitals:   10/02/17 1426  BP: 122/72  Pulse: 85  Resp: 18  Temp: 99.1 F (37.3 C)  TempSrc: Oral  SpO2: 98%  Weight: 217 lb (98.4 kg)  Height: 6\' 4"  (1.93 m)   Body mass index is 26.41 kg/m.  General Alert, O x 3, WD, NAD  Head [place]/AT,    Ear/Nose/ Throat Hearing grossly intact, nares without erythema or drainage, oropharynx without Erythema or Exudate, Mallampati score: 3,   Eyes PERRLA, EOMI,    Neck Supple, mid-line trachea,    Pulmonary Sym exp, good B air movt, CTA B  Cardiac RRR, Nl S1, S2, no Murmurs, No rubs, No S3,S4  Vascular Vessel Right Left  Radial Palpable Palpable  Brachial Palpable Palpable  Carotid Palpable, No Bruit Palpable, No Bruit  Aorta Not palpable N/A  Femoral Palpable Palpable  Popliteal Not palpable Not palpable  PT not examined due to wound Faintly palpable  DP Faintly palpable Faintly palpable    Gastro- intestinal soft, non-distended, non-tender to palpation, No guarding or rebound, no HSM, no masses, no CVAT B, No palpable prominent aortic pulse,    Musculo- skeletal M/S 5/5 throughout except missing L hand, Extremities without ischemic changes except L hand amputation, No edema present, Varicosities present: B large varicosities, Lipodermatosclerosis present: R>L, R medial and lateral malleolus VSU: good granulation in both ulcers  Neurologic Cranial nerves 2-12 intact , Pain and light touch intact in extremities , Motor exam as listed above  Psychiatric Judgement intact, Mood & affect appropriate for pt's clinical situation  Dermatologic See M/S exam for extremity exam, No rashes otherwise noted  Lymphatic  Palpable lymph nodes: None    Non-invasive Vascular Imaging   RLE Venous Insufficiency Duplex (10/02/2017):   RLE:   no DVT and SVT,   no GSV reflux,   + SSV reflux: some change consistent with prior  obstructive mech,  + deep venous reflux: FV, PV   Outside Studies/Documentation   4 pages of outside documents were reviewed including: outpatient PCP chart.   Medical Decision Making   [name] is a 72 y.o. male who presents with: RLE chronic venous insufficiency (C6), chronic VSU   Nothing more to add to this patient's care as his pathology is predominantly deep venous reflux for which there is no procedure or medications.  Wound care already in progress with compression once healed.  Thank you for allowing [place] to participate in this patient's care.  Leonides Sake, MD, FACS [facility] Office: [phone] Pager: [phone]  10/02/2017, 2:51 PM

## 2017-10-04 NOTE — Progress Notes (Signed)
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 lateral wound. Wounds are larger 09/04/16; no real change in the condition of this patient's wound still copious amounts of surface slough. Started him on Iodoflex last week he is completing another course of Cefdinir or which I think was done empirically. His arterial study showed ABIs were 1.1 on the right 1.5 on the left. He did have a slightly reduced ABI in the right the left one was not obtained. Had calcification of the right posterior tibial artery. The interpretation was no segmental stenosis. His waveforms were triphasic. GERMAINE, SHENKER ([id number]) His reflux studies are later this month. Depending on this I'll send him for a vascular consultation, he may need to see plastic surgery as I believe he is had plastic surgery on this foot in the past. He had an injury to the foot in the 1980s. 1/16 /18 right lateral greater than right medial ankle wounds on the right in the setting of previous skin grafting. 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 He does not have arterial issues. Each week he comes in with the same adherent surface slough although there was less of this today 09/18/16; right lateral greater than right medial lower extremity wounds in the setting of previous skin grafting and trauma. He has least to vein laser ablation scheduled for February 2 for venous reflux. He does not have significant arterial disease. Problem has been very difficult to handle surface slough/necrotic tissue. Recently using Iodoflex for this with some, albeit slow improvement 09/25/16; right lateral greater than right medial lower extremity venous wounds in the setting of previous skin grafting. He is going for ablation surgery on February 2 after this he'll come back here for rewrap. He has been using Iodoflex as the primary dressing. 10/02/16; right lateral greater than right medial lower extremity wounds in the setting of previous skin grafting. He had his ablation surgery last week, I don't have a report. He tolerated this well. Came in with a thigh-high Unna boots on Friday. We have been using Iodoflex as the primary dressing. His measurements are improving 10/09/16; continues to make nice aggressive in terms of the wounds on his lateral and medial right ankle in the setting of previous skin grafting. Yesterday he noticed drainage at one of his surgical sites from his venous ablation on the right calf. He took off the bandage over this area felt a  "popping" sensation and a reddish-brown drainage. He is not complaining of any pain 10/16/16; he continues to make nice progression in terms of the wounds on the lateral and medial malleolus. Both smaller using Iodoflex. He had a surgical area in his posterior mid calf we have been using iodoform. All the wounds are down and dimensions 10/23/16; the patient arrives today with no complaints. He states the Iodoflex is a bit uncomfortable. He is not systemically unwell. We have been using Iodoflex to the lateral right ankle and the medial and Aquacel Ag to the reflux surgical wound on the posterior right calf. All of these wounds are doing well 10/30/16; patient states he has no pain no systemic symptoms. I changed him to [facility] last week. Although the wounds are doing well 11/06/16; patient reports no pain or systemic symptoms. We continue with Hydrofera Blue. Both wound areas on the medial and lateral ankle appear to be doing well with improvement and dimensions and improvement in the wound bed. 11/13/16; patient's dimensions continued to improve. We continue with Hydrofera Blue on the medial and lateral side. Appear to be doing well with healthy granulation and advancing epithelialization 11/20/16; patient's dimensions improving laterally by about half a centimeter in length. Otherwise no change on the medial side. Using [facility] 12/04/16; no major change in patient's wound dimensions. Intake nurse reports more drainage. 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 He is voicing no complaints or concerns. He has been tolerating his twice weekly compression therapy changes 12/11/16 Patient complains of pain and increased drainage.. wants hydrofera blue 12/18/16 improvement. Sorbact 12/25/16; medial wound is smaller, lateral measures the same. Still on sorbact 01/01/17; medial wound  continues to be smaller, lateral measures about the same however there is clearly advancing epithelialization here as well in fact I think the wound will ultimately divided into 2 open areas 01/08/17; unfortunately today fairly significant regression in several  areas. Surface of the lateral wound covered again in adherent necrotic material which is difficult to debridement. He has significant surrounding skin maceration. The expanding area of tissue epithelialization in the middle of the wound that was encouraging last week appears to be smaller. There is no surrounding tenderness. The area on the medial leg also did not seem to be as healthy as last week, the reason for this regression this week is not totally clear. We have been using Sorbact for the last 4 weeks. We'll switched of polymen AG which we will order via home medical supply. If there is a problem with this would switch back to Iodoflex 01/15/18; drainage,odor. No change. Switched to polymen last week 01/22/17; still continuous drainage. Culture I did last week showed a few Proteus pansensitive. I did this culture because of drainage. Put him on Augmentin which she has been taking since Saturday however he is developed 4-5 liquid bowel movements. He is also on Coumadin. Beyond this wound is not changed at all, still nonviable necrotic surface material which I debrided reveals healthy granulation line 01/29/17; still copious amounts of drainage reported by her intake nurse. Wound measuring slightly smaller. Currently fact on Iodoflex although I'm looking forward to changing back to perhaps [facility] or polymen AG 02/05/17; still large amounts of drainage and presenting with really large amounts of adherent slough and necrotic material over the remaining open area of the wound. We have been using Iodoflex but with little improvement in the surface. Change to [facility] 02/12/17; still large amount of drainage. Much less adherent slough  however. Started [employer]	[place] last week AJAMU, MAXON ([id number]) 02/19/17; drainage is better this week. Much less adherent slough. Perhaps some improvement in dimensions. Using [facility] 02/26/17; severe venous insufficiency wounds on the right lateral and right medial leg. Drainage is some better and slough is less adherent we've been using Hydrofera Blue 03/05/17 on evaluation today patient appears to be doing well. His wounds have been decreasing in size and overall he is pleased with how this is progressing. We are awaiting approval for the epigraph which has previously been recommended in the meantime the [facility] Dressing is to be doing very well for him. 03/12/17; wound dimensions are smaller still using Hydrofera Blue. Comes in on Fridays for a dressing change. 03/19/17; wound dimensions continued to contract. Healing of this wound is complicated by continuous significant drainage as well as recurrent buildup of necrotic surface material. We looked into Apligraf and he has a $290 co-pay per application but truthfully I think the drainage as well as the nonviable surface would preclude use of Apligraf there are any other skin substitute at this point therefore the continued plan will be debridement each clinic visit, 2 times a week dressing changes and continued use of Hydrofera Blue. improvement has been very slow but sustained 03/26/17 perhaps slight improvements in peripheral epithelialization is especially inferiorly. Still with large amount of drainage and tightly adherent necrotic surface on arrival. Along with the intake nurse I reviewed previous treatment. He worsened on Iodoflex and had a 4 week trial of sorbact, Polymen AG and a long courses of Aquacel Ag. He is not a candidate for advanced treatment options for many reasons 04/09/17 on evaluation today patient's right lower extremity wounds appear to be doing a little better. Fortunately he has no significant  discomfort and has been tolerating the dressing changes including the wraps without complication. With that being said he really does not have swelling  anymore compared to what he has had in the past following his vascular intervention. He wonders if potentially we could attempt avoiding the rats to see if he could cleanse the wound in between to try to prevent some of the fiber and its buildup that occurs in the interim between when we see him week to week. No fevers, chills, nausea, or vomiting noted at this time. 04/16/17; noted that the staff made a choice last week not to put him in compression. The patient is changing his dressing at home using [facility] and changing every second day. His wife is washing the wound with saline. He is using kerlix. Surprisingly he has not developed a lot of edema. This choice was made because of the degree of fluid retention and maceration even with changing his dressing twice a week 04/23/17; absolutely no change. Using Hydrofera Blue. Recurrent tightly adherent nonviable surface material. This is been refractory to Iodoflex, sorbact and now Hydrofera Blue. More recently he has been changing his own dressings at home, cleansing the wound in the shower. He has not developed lower extremity edema 04/30/17; if anything the wound is larger still in adherent surface although it debrided easily today. I've been using Mehta honey for 1 week and I'd like to try and do it for a second week this see if we can get some form of viable surface here. 05/07/17; patient arrives with copious amounts of drainage and some pain in the superior part of the wound. He has not been systemically unwell. He's been changing this daily at home 05/14/17; patient arrives today complaining of less drainage and less pain. Dimensions slightly better. Surface culture I did of this last week grew MSSA I put him on dicloxacillin for 10 days. Patient requesting a further prescription since he feels  so much better. He did not obtain the [facility] for reasons that aren't clear, he has been using Hydrofera Blue 05/21/17; perhaps some less drainage and less pain. He is completing another week of doxycycline. Unfortunately there is no change in the wound measurements are appearance still tightly adherent necrotic surface material that is really defied treatment. We have been using Hydrofera Blue most recently. He did not manage to get Anasept. He was told it was prescription at [facility] 05/29/17; absolutely no improvement in these wound areas. He had remote plastic surgery with who was done at [facility] in [place] surrounding this area. This was a traumatic wound with extensive plastic surgery/skin grafting at that time. I switched him to sorbact last week to see if he can do anything about the recurrent necrotic surface and drainage. This is largely been refractory to Iodoflex/Hydrofera Blue/alginates. I believe we tried Elby Showers and more recently Medi honey l however the drainage is really too excessive 06/04/17; patient went to see Dr. Ulice Bold. Per the patient she ordered him a compression stocking. Continued the Anasept gel and sorbact was already on. No major improvement in the condition of the wounds in fact the medial wound looks larger. Again per the patient she did not feel a skin graft or operative debridement was indicated The patient had previous venous ablation in February 2018. Last arterial studies were in December 2017 and were within normal limits 06/18/17; patient arrives back in clinic today using Anasept gel with sorbact. He changes this every day is wearing his own compression stocking. The patient actually saw Dr. Leta Baptist of plastic surgery. I've reviewed her note. The appointment was on 05/30/17. The basic issue with here  was that she did not feel that skin grafts for patients with underlying stasis and edema have a good long-term success rate. She also discuss  skin substitutes which has been done here as well however I have not been able to get the surface of these wounds to something that I think would support skin substitutes. This is why he felt he might need an BARTLETT, ENKE. ([id number]) operative debridement more aggressively than I can do in this clinic Review of systems; is otherwise negative he feels well 07/02/17; patient has been using Anasept gel with Sorbact. He changes this every day scrubs out the wound beds. Arrives today with the wound bed looking some better. Easier debridement 07/16/17; patient has been using Anasept gel was sorbact for about a month now. The necrotic service on his wound bed is better and the drainage is now down to minimal but we've not made any improvements in the epithelialization. Change to Santyl today. Surface of the wound is still not good enough to support skin substitutes 07/30/17 on evaluation today patient appears to be doing very well in regard to his right bimalleolus wounds. He has been tolerating the treatment with the Anasept gel and sorbact. However we were going to try and switch to Santyl to be more aggressive with these wounds. This was however cost prohibitive. Therefore we will likely go back to the sorbact. Nonetheless he is not having any significant discomfort he still is forming slough over the wound location but nothing too significant. The wounds do appear to be filling in nicely. 08/13/17; I have not seen this wound in about a month. There is not much change. The fact the area medially looks larger. He has been using sorbact and Anasept for over a month now without much effect. Arrives in clinic stating that he does not want the area debrided 08/28/17; patient arrives with the areas on his right medial and right lateral ankle worse. The wounds of expanded there is erythema and still drainage. There is no pain and no tenderness. We had been using Keracel AG clearly not doing well.  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 He states that even the evening that we began utilizing the Iodoflex he started having burning. Subsequently this never really improved and he eventually discontinue the use of the Iodoflex altogether. Unfortunately he did not let us know about any of this until today. I'm unsure of any specific infection at this point although I am going to obtain a wound culture today in order to see if there's anything that could potentially be causing an infection as well. It does sound as if he's had the Iodoflex before and did not have this kind of reaction although this does sound very specific for a reaction to the Iodoflex. 09/17/17 on evaluation today patient appears to be doing significantly better compared to last week's evaluation. At that time it actually appears that he did have an infection the good news is that we have been able to get this under control with switching to the [facility] solution he's not having as much pain and discomfort he still does have some erythema surrounding the medial aspect wound. He has been tolerating the Dakin's soaked gauze  dressing which is excellent and that his wounds do not seem to have as much adherent slough. Overall I'm pleased with the progress she's made in last week. 10/01/17 on evaluation today patient appears to be doing rather well in regard to his right medial and lateral malleolus ulcers. He has been tolerating the dressing changes specifically with the Dakin solution and seems to be doing very well. He is extremely pleased. Patient History Information obtained from Patient. Social History Former smoker - 10 years ago, Marital Status - Married, Alcohol  Use - Moderate, Drug Use - No History, Caffeine Use - Never. Medical And Surgical History Notes Constitutional Symptoms (General Health) Vein Filter (groin area); CODA; H/O Blood Clots; pulmonary hypertensive arterial disease Review of Systems (ROS) Constitutional Symptoms (General Health) Denies complaints or symptoms of Fever, Chills. Respiratory The patient has no complaints or symptoms. Cardiovascular The patient has no complaints or symptoms. Psychiatric The patient has no complaints or symptoms. KHALIK, PEWITT ([id number]) Objective Constitutional Well-nourished and well-hydrated in no acute distress. Vitals Time Taken: 3:28 PM, Height: 76 in, Weight: 238 lbs, BMI: 29, Temperature: 98.5 F, Pulse: 86 bpm, Respiratory Rate: 18 breaths/min, Blood Pressure: 121/69 mmHg. Respiratory normal breathing without difficulty. Psychiatric this patient is able to make decisions and demonstrates good insight into disease process. Alert and Oriented x 3. pleasant and cooperative. General Notes: Patient does have slough surrounding and covering the wound bed. With that being said this is much less tightly adhered then it has been in the past and overall he appears to be doing excellent at this point in time. I was able to perform sharp debridement today without any significant discomfort and patient was pleased with the appearance of the wound post debridement as was I. Integumentary (Hair, Skin) Wound #1 status is Open. Original cause of wound was Gradually Appeared. The wound is located on the Right,Lateral Malleolus. The wound measures 5.1cm length x 2.7cm width x 0.2cm depth; 10.815cm^2 area and 2.163cm^3 volume. There is Fat Layer (Subcutaneous Tissue) Exposed exposed. There is no tunneling or undermining noted. There is a large amount of serous drainage noted. The wound margin is distinct with the outline attached to the wound base. There is medium (34- 66%) granulation within the  wound bed. There is a medium (34-66%) amount of necrotic tissue within the wound bed including Adherent Slough. The periwound skin appearance exhibited: Scarring, Maceration, Hemosiderin Staining, Erythema. The periwound skin appearance did not exhibit: Callus, Crepitus, Excoriation, Induration, Rash, Dry/Scaly, Atrophie Blanche, Cyanosis, Ecchymosis, Mottled, Pallor, Rubor. The surrounding wound skin color is noted with erythema which is circumferential. Periwound temperature was noted as No Abnormality. The periwound has tenderness on palpation. Wound #2 status is Open. Original cause of wound was Gradually Appeared. The wound is located on the Right,Medial Malleolus. The wound measures 3.6cm length x 3.8cm width x 0.2cm depth; 10.744cm^2 area and 2.149cm^3 volume. There is Fat Layer (Subcutaneous Tissue) Exposed exposed. There is no tunneling or undermining noted. There is a large amount of serous drainage noted. The wound margin is distinct with the outline attached to the wound base. There is medium (34- 66%) red granulation within the wound bed. There is a medium (34-66%) amount of necrotic tissue within the wound bed including Adherent Slough. The periwound skin appearance exhibited: Scarring, Maceration, Hemosiderin Staining, Erythema. The periwound skin appearance did not exhibit: Callus, Crepitus, Excoriation, Induration, Rash, Dry/Scaly, Atrophie Blanche, Cyanosis, Ecchymosis, Mottled, Pallor, Rubor. The surrounding wound skin color is noted with erythema which is  circumferential. Periwound temperature was noted as No Abnormality. The periwound has tenderness on palpation. 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 There was a Skin/Subcutaneous Tissue Debridement ([id number]) debridement with total area of 13.77 sq cm performed by STONE III, HOYT E., PA-C. with the following instrument(s): Curette to remove Viable and Non-Viable tissue/material including Exudate, Fibrin/Slough, and Subcutaneous after achieving pain control using Lidocaine 4% Topical Solution. A time out was conducted at 15:37, prior to the start of the procedure. A Minimum amount of bleeding was controlled with Pressure. The procedure was tolerated well with a pain level of 0 throughout and a pain level of 0 following the procedure. Post Debridement Measurements: 5.1cm length x 2.7cm width x 0.3cm depth; 3.244cm^3 volume. Character of Wound/Ulcer Post Debridement requires further debridement. Severity of Tissue Post Debridement is: Fat layer exposed. 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 There was a Skin/Subcutaneous Tissue Debridement ([id number]) debridement with total area of 13.68 sq cm performed by STONE III, HOYT E., PA-C. with the following instrument(s): Curette to remove Viable and Non-Viable tissue/material including Exudate, Fibrin/Slough, and Subcutaneous after achieving pain control using Lidocaine 4% Topical Solution. A time out was conducted at 15:37, prior to the start  of the procedure. A Minimum amount of bleeding was controlled with Pressure. The procedure was tolerated well with a pain level of 0 throughout and a pain level of 0 following the procedure. Post Debridement Measurements: 3.6cm length x 3.8cm width x 0.3cm depth; 3.223cm^3 volume. Character of Wound/Ulcer Post Debridement requires further debridement. Severity of Tissue Post Debridement is: Fat layer exposed. Post procedure Diagnosis Wound #2: Same as Pre-Procedure Plan Wound Cleansing: Wound #1 Right,Lateral Malleolus: Clean wound with Normal Saline. Cleanse wound with mild soap and water May Shower, gently pat wound dry prior to applying new dressing. Wound #2 Right,Medial Malleolus: Clean wound with Normal Saline. Cleanse wound with mild soap and water May Shower, gently pat wound dry prior to applying new dressing. BRECKEN, DEWOODY ([id number]) Anesthetic (add to Medication List): Wound #1 Right,Lateral Malleolus: Topical Lidocaine 4% cream applied to wound bed prior to debridement (In Clinic Only). Wound #2 Right,Medial Malleolus: Topical Lidocaine 4% cream applied to wound bed prior to debridement (In Clinic Only). Skin Barriers/Peri-Wound Care: Wound #1 Right,Lateral Malleolus: Barrier cream Wound #2 Right,Medial Malleolus: Barrier cream Primary Wound Dressing: Wound #1 Right,Lateral Malleolus: Other: - Dakins Solution Wound #2 Right,Medial Malleolus: Other: - Dakins Solution Secondary Dressing: Wound #1 Right,Lateral Malleolus: ABD pad Conform/Kerlix XtraSorb Wound #2 Right,Medial Malleolus: ABD pad [employer] Xtra[employer]orb Dressing Change Frequency: Wound #1 Right,Lateral Malleolus: Change dressing every other day. Wound #2 Right,Medial Malleolus: Change dressing every other day. Follow-up Appointments: Wound #1 Right,Lateral Malleolus: Return Appointment in 1 week. Wound #2  Right,Medial Malleolus: Return Appointment in 1 week. Edema Control: Wound #1  Right,Lateral Malleolus: Elevate legs to the level of the heart and pump ankles as often as possible Wound #2 Right,Medial Malleolus: Elevate legs to the level of the heart and pump ankles as often as possible Additional Orders / Instructions: Wound #1 Right,Lateral Malleolus: Increase protein intake. Wound #2 Right,Medial Malleolus: Increase protein intake. Medications-please add to medication list.: Wound #1 Right,Lateral Malleolus: Other: - Vitamin C, Zinc, MVI Wound #2 Right,Medial Malleolus: Other: - Vitamin C, Zinc, MVI The following medication(s) was prescribed: lidocaine topical 4 % cream 1 1 cream topical was prescribed at facility I am going to recommend that we continue with the Current wound care measures for the next week. Patient is in agreement with this plan. Hopefully he will continue to show signs of improvement in regard to his ulcers as he has up to this point. KLAUS, CASTENEDA ([id number]) Please see above for specific wound care orders. We will see patient for re-evaluation in 1 week(s) here in the clinic. If anything worsens or changes patient will contact our office for additional recommendations. Electronic Signature(s) Signed: 10/01/2017 7:27:36 PM By: Lenda Kelp PA-C Entered By: Lenda Kelp on 10/01/2017 18:08:54 [name] ([id number]) -------------------------------------------------------------------------------- ROS/PFSH Details Patient Name: [name] Date of Service: 10/01/2017 3:15 PM Medical Record Number: [id number] Patient Account Number: [id number] Date of Birth/Sex: 03/23/1946 (72 y.o. Male) Treating RN: Ashok Cordia, [name] Primary Care Provider: [name] Other Clinician: Referring Provider: [name] Treating Provider/Extender: STONE III, HOYT Weeks in Treatment: 63 Information Obtained From Patient Wound History Do you currently have one or more open woundso Yes How many open wounds do you currently haveo  1 Approximately how long have you had your woundso 5 months How have you been treating your wound(s) until nowo saline, dressing Has your wound(s) ever healed and then re-openedo Yes Have you had any lab work done in the past montho No Have you tested positive for an antibiotic resistant organism (MRSA, VRE)o No Have you tested positive for osteomyelitis (bone infection)o No Have you had any tests for circulation on your legso No Constitutional Symptoms (General Health) Complaints and Symptoms: Negative for: Fever; Chills Medical History: Past Medical History Notes: Vein Filter (groin area); CODA; H/O Blood Clots; pulmonary hypertensive arterial disease Eyes Medical History: Positive for: Cataracts - removed Negative for: Glaucoma; Optic Neuritis Ear/Nose/Mouth/Throat Medical History: Negative for: Chronic sinus problems/congestion Hematologic/Lymphatic Medical History: Negative for: Anemia; Hemophilia; Human Immunodeficiency Virus; Lymphedema; Sickle Cell Disease Respiratory Complaints and Symptoms: No Complaints or Symptoms Medical History: Positive for: Chronic Obstructive Pulmonary Disease (COPD) Negative for: Aspiration; Asthma; Pneumothorax; Sleep Apnea; Tuberculosis Cardiovascular DAION, GINSBERG. ([id number]) Complaints and Symptoms: No Complaints or Symptoms Medical History: Negative for: Angina; Arrhythmia; Congestive Heart Failure; Coronary Artery Disease; Deep Vein Thrombosis; Hypertension; Hypotension; Myocardial Infarction; Peripheral Arterial Disease; Peripheral Venous Disease; Phlebitis; Vasculitis Gastrointestinal Medical History: Negative for: Cirrhosis ; Colitis; Crohnos; Hepatitis A; Hepatitis B; Hepatitis C Endocrine Medical History: Negative for: Type I Diabetes; Type II Diabetes Genitourinary Medical History: Negative for: End Stage Renal Disease Immunological Medical History: Negative for: Lupus Erythematosus; Raynaudos Integumentary  (Skin) Medical History: Negative for: History of Burn; History of pressure wounds Musculoskeletal Medical History: Positive for: Osteoarthritis Negative for: Gout; Rheumatoid Arthritis; Osteomyelitis Neurologic Medical History: Negative for: Dementia; Neuropathy; Quadriplegia; Paraplegia; Seizure Disorder Oncologic Medical History: Negative for: Received Chemotherapy; Received Radiation Psychiatric Complaints and Symptoms: No Complaints or Symptoms Medical History: Negative for: Anorexia/bulimia; Confinement  Anxiety HBO Extended History Items COYLE, STORDAHL ([id number]) Eyes: Cataracts Immunizations Pneumococcal Vaccine: Received Pneumococcal Vaccination: No Implantable Devices Family and Social History Former smoker - 10 years ago; Marital Status - Married; Alcohol Use: Moderate; Drug Use: No History; Caffeine Use: Never; Advanced Directives: No; Patient does not want information on Advanced Directives; Living Will: No; Medical Power of Attorney: No Physician Affirmation I have reviewed and agree with the above information. Electronic Signature(s) Signed: 10/01/2017 7:27:36 PM By: Lenda Kelp PA-C Signed: 10/03/2017 3:16:44 PM By: Alejandro Mulling Entered By: Lenda Kelp on 10/01/2017 18:07:31 [name] ([id number]) -------------------------------------------------------------------------------- SuperBill Details Patient Name: [name] Date of Service: 10/01/2017 Medical Record Number: [id number] Patient Account Number: [id number] Date of Birth/Sex: 03/18/1946 (72 y.o. 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, initial encounter T81.31XD Disruption of external operation (surgical) wound, not elsewhere classified, subsequent encounter L97.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 of skin Facility Procedures CPT4 Code: 01027253 Description: 11042 - DEB SUBQ TISSUE 20 SQ CM/< ICD-10 Diagnosis Description L97.212 Non-pressure chronic ulcer of right calf with fat layer expos Modifier: ed Quantity: 1 CPT4 Code: 66440347 Description: 11045 - DEB SUBQ TISS EA ADDL 20CM ICD-10 Diagnosis Description L97.212 Non-pressure chronic ulcer of right calf with fat layer expos Modifier: ed Quantity: 1 Physician Procedures CPT4 Code: 4259563 Description: 11042 - WC PHYS SUBQ TISS 20 SQ CM ICD-10 Diagnosis Description L97.212 Non-pressure chronic ulcer of right calf with fat layer expose Modifier: d Quantity: 1 CPT4 Code: 8756433 Description: 11045 - WC PHYS SUBQ TISS EA ADDL 20 CM ICD-10 Diagnosis Description L97.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

## 2017-10-08 ENCOUNTER — Encounter: Payer: Medicare HMO | Admitting: Physician Assistant

## 2017-10-08 DIAGNOSIS — I87331 Chronic venous hypertension (idiopathic) with ulcer and inflammation of right lower extremity: Secondary | ICD-10-CM | POA: Diagnosis not present

## 2017-10-09 NOTE — Progress Notes (Signed)
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 reflux. He does not have significant arterial  disease. Problem has been very difficult to handle surface slough/necrotic tissue. Recently using Iodoflex for  this with some, albeit slow improvement 09/25/16; right lateral greater than right medial lower extremity venous wounds in the setting of previous skin grafting. He is going for ablation surgery on February 2 after this he'll come back here for rewrap. He has been using Iodoflex as the primary dressing. 10/02/16; right lateral greater than right medial lower extremity wounds in the setting of previous skin grafting. He had his ablation surgery last week, I don't have a report. He tolerated this well. Came in with a thigh-high Unna boots on Friday. We have been using Iodoflex as the primary dressing. His measurements are improving 10/09/16; continues to make nice aggressive in terms of the wounds on his lateral and medial right ankle in the setting of previous skin grafting. Yesterday he noticed drainage at one of his surgical sites from his venous ablation on the right calf. He took off the bandage over this area felt a "popping" sensation and a reddish-brown drainage. He is not complaining of any pain 10/16/16; he continues to make nice progression in terms of the wounds on the lateral and medial malleolus. Both smaller using Iodoflex. He had a surgical area in his posterior mid calf we have been using iodoform. All the wounds are down and dimensions 10/23/16; the patient arrives today with no complaints. He states the Iodoflex is a bit uncomfortable. He is not systemically unwell. We have been using Iodoflex to the lateral right ankle and the medial and Aquacel Ag to the reflux surgical wound on the posterior right calf. All of these wounds are doing well 10/30/16; patient states he has no pain no systemic symptoms. I changed him to [facility] last week. Although the wounds are doing well 11/06/16; patient reports no pain or systemic symptoms. We continue with Hydrofera Blue. Both wound  areas on the medial and lateral ankle appear to be doing well with improvement and dimensions and improvement in the wound bed. 11/13/16; patient's dimensions continued to improve. We continue with Hydrofera Blue on the medial and lateral side. Appear to be doing well with healthy granulation and advancing epithelialization 11/20/16; patient's dimensions improving laterally by about half a centimeter in length. Otherwise no change on the medial side. Using [facility] 12/04/16; no major change in patient's wound dimensions. Intake nurse reports more drainage. 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 He is voicing no complaints or concerns. He has been tolerating his twice weekly compression therapy changes 12/11/16 Patient complains of pain and increased drainage.. wants hydrofera blue 12/18/16 improvement. Sorbact 12/25/16; medial wound is smaller, lateral measures the same. Still on sorbact 01/01/17; medial wound continues to be smaller, lateral measures about the same however there is clearly advancing epithelialization here as well in fact I think the wound will ultimately divided into 2 open areas 01/08/17; unfortunately today fairly significant regression in several areas. Surface of the lateral wound covered again in adherent necrotic material which is difficult to debridement. He has significant surrounding skin maceration. The expanding area of tissue epithelialization in the middle of the wound that was encouraging last week appears to be smaller. There is no surrounding tenderness. The area on the medial leg also did not seem to be as healthy as last week, the reason for this regression this week is not totally clear. We have been using Sorbact for the last 4 weeks. We'll switched of polymen AG which we will order via home  medical supply. If there is a problem with this would switch  back to Iodoflex 01/15/18; drainage,odor. No change. Switched to polymen last week 01/22/17; still continuous drainage. Culture I did last week showed a few Proteus pansensitive. I did this culture because of drainage. Put him on Augmentin which she has been taking since Saturday however he is developed 4-5 liquid bowel movements. He is also on Coumadin. Beyond this wound is not changed at all, still nonviable necrotic surface material which I debrided reveals healthy granulation line 01/29/17; still copious amounts of drainage reported by her intake nurse. Wound measuring slightly smaller. Currently fact on Iodoflex although I'm looking forward to changing back to perhaps [facility] or polymen AG 02/05/17; still large amounts of drainage and presenting with really large amounts of adherent slough and necrotic material over the remaining open area of the wound. We have been using Iodoflex but with little improvement in the surface. Change to [facility] 02/12/17; still large amount of drainage. Much less adherent slough however. Started [employer]	[place] last week ABDALRAHMAN, CLEMENTSON ([id number]) 02/19/17; drainage is better this week. Much less adherent slough. Perhaps some improvement in dimensions. Using [facility] 02/26/17; severe venous insufficiency wounds on the right lateral and right medial leg. Drainage is some better and slough is less adherent we've been using Hydrofera Blue 03/05/17 on evaluation today patient appears to be doing well. His wounds have been decreasing in size and overall he is pleased with how this is progressing. We are awaiting approval for the epigraph which has previously been recommended in the meantime the [facility] Dressing is to be doing very well for him. 03/12/17; wound dimensions are smaller still using Hydrofera Blue. Comes in on Fridays for a dressing change. 03/19/17; wound dimensions continued to contract. Healing of this wound is complicated by  continuous significant drainage as well as recurrent buildup of necrotic surface material. We looked into Apligraf and he has a $290 co-pay per application but truthfully I think the drainage as well as the nonviable surface would preclude use of Apligraf there are any other skin substitute at this point therefore the continued plan will be debridement each clinic visit, 2 times a week dressing changes and continued use of Hydrofera Blue. improvement has been very slow but sustained 03/26/17 perhaps slight improvements in peripheral epithelialization is especially inferiorly. Still with large amount of drainage and tightly adherent necrotic surface on arrival. Along with the intake nurse I reviewed previous treatment. He worsened on Iodoflex and had a 4 week trial of sorbact, Polymen AG and a long courses of Aquacel Ag. He is not a candidate for advanced treatment options for many reasons 04/09/17 on evaluation today patient's right lower extremity wounds appear to be doing a little better. Fortunately he has no significant discomfort and has been tolerating the dressing changes including the wraps without complication. With that being said he really does not have swelling anymore compared to what he has had in the past following his vascular intervention. He wonders if potentially we could attempt avoiding the rats to see if he could cleanse the wound in between to try to prevent some of the fiber and its buildup that occurs in the interim between when we see him week to week. No fevers, chills, nausea, or vomiting noted at this time. 04/16/17; noted that the staff made a choice last week not to put him in compression. The patient is changing his dressing at home using [facility] and changing every  second day. His wife is washing the wound with saline. He is using kerlix. Surprisingly he has not developed a lot of edema. This choice was made because of the degree of fluid retention and maceration  even with changing his dressing twice a week 04/23/17; absolutely no change. Using Hydrofera Blue. Recurrent tightly adherent nonviable surface material. This is been refractory to Iodoflex, sorbact and now Hydrofera Blue. More recently he has been changing his own dressings at home, cleansing the wound in the shower. He has not developed lower extremity edema 04/30/17; if anything the wound is larger still in adherent surface although it debrided easily today. I've been using Mehta honey for 1 week and I'd like to try and do it for a second week this see if we can get some form of viable surface here. 05/07/17; patient arrives with copious amounts of drainage and some pain in the superior part of the wound. He has not been systemically unwell. He's been changing this daily at home 05/14/17; patient arrives today complaining of less drainage and less pain. Dimensions slightly better. Surface culture I did of this last week grew MSSA I put him on dicloxacillin for 10 days. Patient requesting a further prescription since he feels so much better. He did not obtain the [facility] AG for reasons that aren't clear, he has been using Hydrofera Blue 05/21/17; perhaps some less drainage and less pain. He is completing another week of doxycycline. Unfortunately there is no change in the wound measurements are appearance still tightly adherent necrotic surface material that is really defied treatment. We have been using Hydrofera Blue most recently. He did not manage to get Anasept. He was told it was prescription at [facility] 05/29/17; absolutely no improvement in these wound areas. He had remote plastic surgery with who was done at [facility] in [place] surrounding this area. This was a traumatic wound with extensive plastic surgery/skin grafting at that time. I switched him to sorbact last week to see if he can do anything about the recurrent necrotic surface and drainage. This is largely been refractory to  Iodoflex/Hydrofera Blue/alginates. I believe we tried Elby Showers and more recently Medi honey l however the drainage is really too excessive 06/04/17; patient went to see Dr. Ulice Bold. Per the patient she ordered him a compression stocking. Continued the Anasept gel and sorbact was already on. No major improvement in the condition of the wounds in fact the medial wound looks larger. Again per the patient she did not feel a skin graft or operative debridement was indicated The patient had previous venous ablation in February 2018. Last arterial studies were in December 2017 and were within normal limits 06/18/17; patient arrives back in clinic today using Anasept gel with sorbact. He changes this every day is wearing his own compression stocking. The patient actually saw Dr. Leta Baptist of plastic surgery. I've reviewed her note. The appointment was on 05/30/17. The basic issue with here was that she did not feel that skin grafts for patients with underlying stasis and edema have a good long-term success rate. She also discuss skin substitutes which has been done here as well however I have not been able to get the surface of these wounds to something that I think would support skin substitutes. This is why he felt he might need an [name], [name]. ([id number]) operative debridement more aggressively than I can do in this clinic Review of systems; is otherwise negative he feels well 07/02/17; patient has been using Anasept gel with  Sorbact. He changes this every day scrubs out the wound beds. Arrives today with the wound bed looking some better. Easier debridement 07/16/17; patient has been using Anasept gel was sorbact for about a month now. The necrotic service on his wound bed is better and the drainage is now down to minimal but we've not made any improvements in the epithelialization. Change to Santyl today. Surface of the wound is still not good enough to support skin substitutes 07/30/17 on  evaluation today patient appears to be doing very well in regard to his right bimalleolus wounds. He has been tolerating the treatment with the Anasept gel and sorbact. However we were going to try and switch to Santyl to be more aggressive with these wounds. This was however cost prohibitive. Therefore we will likely go back to the sorbact. Nonetheless he is not having any significant discomfort he still is forming slough over the wound location but nothing too significant. The wounds do appear to be filling in nicely. 08/13/17; I have not seen this wound in about a month. There is not much change. The fact the area medially looks larger. He has been using sorbact and Anasept for over a month now without much effect. Arrives in clinic stating that he does not want the area debrided 08/28/17; patient arrives with the areas on his right medial and right lateral ankle worse. The wounds of expanded there is erythema and still drainage. There is no pain and no tenderness. We had been using Keracel AG clearly not doing well. 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 He states that even the evening that we began utilizing the Iodoflex he started having burning. Subsequently this never really improved and he eventually discontinue the use of the Iodoflex altogether. Unfortunately he did not let us know about any of this until today. I'm unsure of any specific infection at this point although I am going to obtain a wound culture today in order to see if there's anything that could potentially be causing an infection as well. It does sound as if he's had the Iodoflex before and did not have this kind of reaction although this does sound very  specific for a reaction to the Iodoflex. 09/17/17 on evaluation today patient appears to be doing significantly better compared to last week's evaluation. At that time it actually appears that he did have an infection the good news is that we have been able to get this under control with switching to the [facility] solution he's not having as much pain and discomfort he still does have some erythema surrounding the medial aspect wound. He has been tolerating the Dakin's soaked gauze dressing which is excellent and that his wounds do not seem to have as much adherent slough. Overall I'm pleased with the progress she's made in last week. 10/08/17 on evaluation today patient appears to be doing well in regard to his right medial and lateral malleolus ulcers. With that being said he has started to develop some green drainage at this point again. He tells me that this showed up just shortly after he quit taking his antibiotics. I do feel like that he has an underlying infection that even 19 day course of antibiotic therapy  does not seem to have fully resolved. Today we discussed potentially doing a 30 day course of antibiotics to try to see if we can completely knock this out. He is in agreement with the plan. Patient History Information obtained from Patient. Social History Former smoker - 10 years ago, Marital Status - Married, Alcohol Use - Moderate, Drug Use - No History, Caffeine Use - Never. Medical And Surgical History Notes Constitutional Symptoms (General Health) Vein Filter (groin area); CODA; H/O Blood Clots; pulmonary hypertensive arterial disease Review of Systems (ROS) Constitutional Symptoms (General Health) Denies complaints or symptoms of Fever, Chills. Respiratory The patient has no complaints or symptoms. Cardiovascular Complains or has symptoms of LE edema. [name], [name] ([id number]) Psychiatric The patient has no complaints or  symptoms. Objective Constitutional Well-nourished and well-hydrated in no acute distress. Vitals Time Taken: 9:12 AM, Height: 76 in, Weight: 238 lbs, BMI: 29, Temperature: 98.3 F, Pulse: 84 bpm, Respiratory Rate: 18 breaths/min, Blood Pressure: 138/75 mmHg. Respiratory normal breathing without difficulty. Psychiatric this patient is able to make decisions and demonstrates good insight into disease process. Alert and Oriented x 3. pleasant and cooperative. General Notes: Patient's wound beds were slough covered but again the slough did not appear to be as adherent at this point in time as previously this has been. I do believe the Lisette Grinder continues to be of benefit for him. He tolerated debridement well today without complication post debridement the wound appear to be doing much better. Integumentary (Hair, Skin) Wound #1 status is Open. Original cause of wound was Gradually Appeared. The wound is located on the Right,Lateral Malleolus. The wound measures 6.1cm length x 6.1cm width x 0.3cm depth; 29.225cm^2 area and 8.767cm^3 volume. There is Fat Layer (Subcutaneous Tissue) Exposed exposed. There is no tunneling or undermining noted. There is a large amount of serous drainage noted. The wound margin is distinct with the outline attached to the wound base. There is medium (34- 66%) red granulation within the wound bed. There is a medium (34-66%) amount of necrotic tissue within the wound bed including Adherent Slough. The periwound skin appearance exhibited: Scarring, Maceration, Hemosiderin Staining, Erythema. The periwound skin appearance did not exhibit: Callus, Crepitus, Excoriation, Induration, Rash, Dry/Scaly, Atrophie Blanche, Cyanosis, Ecchymosis, Mottled, Pallor, Rubor. The surrounding wound skin color is noted with erythema which is circumferential. Periwound temperature was noted as No Abnormality. The periwound has tenderness on palpation. Wound #2 status is Open. Original cause of  wound was Gradually Appeared. The wound is located on the Right,Medial Malleolus. The wound measures 4cm length x 3.6cm width x 0.2cm depth; 11.31cm^2 area and 2.262cm^3 volume. There is Fat Layer (Subcutaneous Tissue) Exposed exposed. There is no tunneling or undermining noted. There is a large amount of serous drainage noted. The wound margin is distinct with the outline attached to the wound base. There is medium (34-66%) red granulation within the wound bed. There is a medium (34-66%) amount of necrotic tissue within the wound bed including Adherent Slough. The periwound skin appearance exhibited: Scarring, Maceration, Hemosiderin Staining, Erythema. The periwound skin appearance did not exhibit: Callus, Crepitus, Excoriation, Induration, Rash, Dry/Scaly, Atrophie Blanche, Cyanosis, Ecchymosis, Mottled, Pallor, Rubor. The surrounding wound skin color is noted with erythema which is circumferential. Periwound temperature was noted as No Abnormality. The periwound has tenderness on palpation. 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 There was a Skin/Subcutaneous Tissue Debridement ([id number]) debridement with total area of 37.21 sq cm performed by STONE  III, HOYT E., PA-C. with the following instrument(s): Curette to remove Viable and Non-Viable tissue/material including Fibrin/Slough, Skin, and Subcutaneous after achieving pain control using Other (lidocaine 4%). A time out was conducted at 09:40, prior to the start of the procedure. A Minimum amount of bleeding was controlled with Pressure. The procedure was tolerated well with a pain level of 0 throughout and a pain level of 0 following the procedure. Post Debridement Measurements: 6.1cm length x 6.1cm width x 0.3cm depth; 8.767cm^3 volume. Character of Wound/Ulcer Post Debridement is stable. Severity of Tissue Post Debridement is: Limited to breakdown of skin. 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 There was a Skin/Subcutaneous Tissue Debridement ([id number]) debridement with total area of 14.4 sq cm performed by STONE III, HOYT E., PA-C. with the following instrument(s): Curette to remove Viable and Non-Viable tissue/material including Fibrin/Slough, Skin, and Subcutaneous after achieving pain control using Other (lidocaine 4%). A time out was conducted at 09:40, prior to the start of the procedure. A Minimum amount of bleeding was controlled with Pressure. The procedure was tolerated well with a pain level of 0 throughout and a pain level of 0 following the procedure. Post Debridement Measurements: 4cm length x 3.6cm width x 0.2cm depth; 2.262cm^3 volume. Character of Wound/Ulcer Post Debridement is stable. Severity of Tissue Post Debridement is: Fat layer exposed. Post procedure Diagnosis Wound #2: Same as Pre-Procedure Plan Wound Cleansing: Wound #1 Right,Lateral Malleolus: Clean wound with Normal Saline. Cleanse wound with mild soap and water May Shower, gently pat wound dry prior to applying new  dressing. Wound #2 Right,Medial Malleolus: Clean wound with Normal Saline. Cleanse wound with mild soap and water May Shower, gently pat wound dry prior to applying new dressing. Anesthetic (add to Medication List): Wound #1 Right,Lateral Malleolus: [name], [name] ([id number]) Topical Lidocaine 4% cream applied to wound bed prior to debridement (In Clinic Only). Wound #2 Right,Medial Malleolus: Topical Lidocaine 4% cream applied to wound bed prior to debridement (In Clinic Only). Skin Barriers/Peri-Wound Care: Wound #1 Right,Lateral Malleolus: Barrier cream Wound #2 Right,Medial Malleolus: Barrier cream Primary Wound Dressing: Wound #1 Right,Lateral Malleolus: Other: - Dakins Solution Wound #2 Right,Medial Malleolus: Other: - Dakins Solution Secondary Dressing: Wound #1 Right,Lateral Malleolus: ABD pad Conform/Kerlix XtraSorb Wound #2 Right,Medial Malleolus: ABD pad Conform/Kerlix XtraSorb Dressing Change Frequency: Wound #1 Right,Lateral Malleolus: Change dressing every other day. Wound #2 Right,Medial Malleolus: Change dressing every other day. Follow-up Appointments: Wound #1 Right,Lateral Malleolus: Return Appointment in 1 week. Wound #2 Right,Medial Malleolus: Return Appointment in 1 week. Edema Control: Wound #1 Right,Lateral Malleolus: Elevate legs to the level of the heart and pump ankles as often as possible Wound #2 Right,Medial Malleolus: Elevate legs to the level of the heart and pump ankles as often as possible Additional Orders /  Instructions: Wound #1 Right,Lateral Malleolus: Increase protein intake. Wound #2 Right,Medial Malleolus: Increase protein intake. Medications-please add to medication list.: Wound #1 Right,Lateral Malleolus: Other: - Vitamin C, Zinc, MVI Wound #2 Right,Medial Malleolus: Other: - Vitamin C, Zinc, MVI The following medication(s) was prescribed: Bactrim DS oral 800 mg-160 mg tablet 1 1 tablet oral taken 2 times a day  for 30 days starting 10/08/2017 I am going to recommend that we continue with the Current wound care measures although I'm also going to place a repeat order for the antibiotic for him and I'm gonna send that to the pharmacy today. Patient in agreement with this plan. We will see were things stand next week when we see him for reevaluation. [name], [name] ([id number]) Please see above for specific wound care orders. We will see patient for re-evaluation in 1 week(s) here in the clinic. If anything worsens or changes patient will contact our office for additional recommendations. Electronic Signature(s) Signed: 10/08/2017 5:22:42 PM By: Lenda Kelp PA-C Entered By: Lenda Kelp on 10/08/2017 09:57:28 [name] ([id number]) -------------------------------------------------------------------------------- ROS/PFSH Details Patient Name: [name] Date of Service: 10/08/2017 9:15 AM Medical Record Number: [id number] Patient Account Number: [id number] Date of Birth/Sex: 02-15-1946 (72 y.o. Male) Treating RN: Renne Crigler Primary Care Provider: [name] Other Clinician: Referring Provider: [name] Treating Provider/Extender: STONE III, HOYT Weeks in Treatment: 90 Information Obtained From Patient Wound History Do you currently have one or more open woundso Yes How many open wounds do you currently haveo 1 Approximately how long have you had your woundso 5 months How have you been treating your wound(s) until nowo saline, dressing Has your wound(s) ever healed and then re-openedo Yes Have you had any lab work done in the past montho No Have you tested positive for an antibiotic resistant organism (MRSA, VRE)o No Have you tested positive for osteomyelitis (bone infection)o No Have you had any tests for circulation on your legso No Constitutional Symptoms (General Health) Complaints and Symptoms: Negative for: Fever; Chills Medical History: Past  Medical History Notes: Vein Filter (groin area); CODA; H/O Blood Clots; pulmonary hypertensive arterial disease Cardiovascular Complaints and Symptoms: Positive for: LE edema Medical History: Negative for: Angina; Arrhythmia; Congestive Heart Failure; Coronary Artery Disease; Deep Vein Thrombosis; Hypertension; Hypotension; Myocardial Infarction; Peripheral Arterial Disease; Peripheral Venous Disease; Phlebitis; Vasculitis Eyes Medical History: Positive for: Cataracts - removed Negative for: Glaucoma; Optic Neuritis Ear/Nose/Mouth/Throat Medical History: Negative for: Chronic sinus problems/congestion Hematologic/Lymphatic Medical History: Negative for: Anemia; Hemophilia; Human Immunodeficiency Virus; Lymphedema; Sickle Cell Disease Respiratory [name], [name] ([id number]) Complaints and Symptoms: No Complaints or Symptoms Medical History: Positive for: Chronic Obstructive Pulmonary Disease (COPD) Negative for: Aspiration; Asthma; Pneumothorax; Sleep Apnea; Tuberculosis Gastrointestinal Medical History: Negative for: Cirrhosis ; Colitis; Crohnos; Hepatitis A; Hepatitis B; Hepatitis C Endocrine Medical History: Negative for: Type I Diabetes; Type II Diabetes Genitourinary Medical History: Negative for: End Stage Renal Disease Immunological Medical History: Negative for: Lupus Erythematosus; Raynaudos Integumentary (Skin) Medical History: Negative for: History of Burn; History of pressure wounds Musculoskeletal Medical History: Positive for: Osteoarthritis Negative for: Gout; Rheumatoid Arthritis; Osteomyelitis Neurologic Medical History: Negative for: Dementia; Neuropathy; Quadriplegia; Paraplegia; Seizure Disorder Oncologic Medical History: Negative for: Received Chemotherapy; Received Radiation Psychiatric Complaints and Symptoms: No Complaints or Symptoms Medical History: Negative for: Anorexia/bulimia; Confinement Anxiety HBO Extended History  Items Eyes: Cataracts [name], [name] ([id number]) Immunizations Pneumococcal Vaccine: Received Pneumococcal Vaccination: No Implantable Devices Family and Social History Former smoker - 10 years ago; Marital  Status - Married; Alcohol Use: Moderate; Drug Use: No History; Caffeine Use: Never; Advanced Directives: No; Patient does not want information on Advanced Directives; Living Will: No; Medical Power of Attorney: No Physician Affirmation I have reviewed and agree with the above information. Electronic Signature(s) Signed: 10/08/2017 4:28:38 PM By: Renne Crigler Signed: 10/08/2017 5:22:42 PM By: Lenda Kelp PA-C Entered By: Lenda Kelp on 10/08/2017 09:54:02 [name] ([id number]) -------------------------------------------------------------------------------- SuperBill Details Patient Name: [name] Date of Service: 10/08/2017 Medical Record Number: [id number] Patient Account Number: [id number] Date of Birth/Sex: Jul 28, 1946 (72 y.o. 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, initial encounter T81.31XD Disruption of external operation (surgical) wound, not elsewhere classified, subsequent encounter L97.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 of skin Facility Procedures CPT4 Code: 78295621 Description: 11042 - DEB SUBQ TISSUE 20 SQ CM/< ICD-10 Diagnosis Description L97.212 Non-pressure chronic ulcer  of right calf with fat layer expos Modifier: ed Quantity: 1 CPT4 Code: 30865784 Description: 11045 - DEB SUBQ TISS EA ADDL 20CM ICD-10 Diagnosis Description L97.212 Non-pressure chronic ulcer of right calf with fat layer expos Modifier: ed Quantity: 2 Physician Procedures CPT4 Code: 6962952 Description: 11042 - WC PHYS SUBQ TISS 20 SQ CM ICD-10 Diagnosis Description L97.212 Non-pressure chronic ulcer of right calf with fat layer expose Modifier: d Quantity: 1 CPT4 Code: 8413244 Description: 11045 - WC PHYS SUBQ TISS EA ADDL 20 CM ICD-10 Diagnosis Description L97.212 Non-pressure chronic ulcer of right calf with fat layer expose Modifier: d Quantity: 2 Electronic Signature(s) Signed: 10/08/2017 5:22:42 PM By: Lenda Kelp PA-C Entered By: Lenda Kelp on 10/08/2017 09:57:57

## 2017-10-09 NOTE — Progress Notes (Signed)
[name], [name] ([id number]) Visit Report for 10/08/2017 Arrival Information Details Patient Name: [name], [name] Date of Service: 10/08/2017 9:15 AM Medical Record Number: [id number] Patient Account Number: [id number] Date of Birth/Sex: 1945-10-05 18(71 y.o. Male) Treating RN: Renne CriglerFlinchum, Cheryl Primary Care Jayveion Stalling: Darreld McleanMILES, LINDA Other Clinician: Referring Ascencion Coye: Darreld McleanMILES, LINDA Treating Shoshanah Dapper/Extender: Linwood DibblesSTONE III, HOYT Weeks in Treatment: 90 Visit Information History Since Last Visit All ordered tests and consults were completed: No Patient Arrived: Ambulatory Added or deleted any medications: No Arrival Time: 09:10 Any new allergies or adverse reactions: No Accompanied By: self Had a fall or experienced change in No Transfer Assistance: None activities of daily living that may affect Patient Requires Transmission-Based No risk of falls: Precautions: Signs or symptoms of abuse/neglect since last visito No Patient Has Alerts: Yes Hospitalized since last visit: No Patient Alerts: Patient on Blood Pain Present Now: No Thinner Electronic Signature(s) Signed: 10/08/2017 4:28:38 PM By: Renne CriglerFlinchum, Cheryl Entered By: Renne CriglerFlinchum, Cheryl on 10/08/2017 09:12:03 [name], Azaryah C. ([id number]) -------------------------------------------------------------------------------- Encounter Discharge Information Details Patient Name: [name], [name] Date of Service: 10/08/2017 9:15 AM Medical Record Number: [id number] Patient Account Number: [id number] Date of Birth/Sex: 1945-10-05 24(71 y.o. Male) Treating RN: Renne CriglerFlinchum, Cheryl Primary Care Kieryn Burtis: Darreld McleanMILES, LINDA Other Clinician: Referring Clarion Mooneyhan: Darreld McleanMILES, LINDA Treating Gabrielly Mccrystal/Extender: STONE III, HOYT Weeks in Treatment: 90 Encounter Discharge Information Items Discharge Pain Level: 0 Discharge Condition: Stable Ambulatory Status: Ambulatory Discharge Destination: Home Transportation: Private Auto Accompanied By:  self Schedule Follow-up Appointment: Yes Medication Reconciliation completed and No provided to Patient/Care Lebron Nauert: Patient Clinical Summary of Care: Declined Electronic Signature(s) Signed: 10/08/2017 4:30:32 PM By: Gwenlyn PerkingMoore, Shelia Entered By: Gwenlyn PerkingMoore, Shelia on 10/08/2017 09:56:22 [name], Wai C. ([id number]) -------------------------------------------------------------------------------- Lower Extremity Assessment Details Patient Name: [name], [name] Date of Service: 10/08/2017 9:15 AM Medical Record Number: [id number] Patient Account Number: [id number] Date of Birth/Sex: 1945-10-05 [age] y.o. Male) Treating RN: Renne CriglerFlinchum, Cheryl Primary Care Leilanee Righetti: Darreld McleanMILES, LINDA Other Clinician: Referring Euell Schiff: Darreld McleanMILES, LINDA Treating Kamon Fahr/Extender: STONE III, HOYT Weeks in Treatment: 90 Edema Assessment Assessed: [Left: No] [Right: No] Edema: [Left: N] [Right: o] Vascular Assessment Claudication: Claudication Assessment [Right:None] Pulses: Dorsalis Pedis Palpable: [Right:Yes] Posterior Tibial Extremity colors, hair growth, and conditions: Extremity Color: [Right:Hyperpigmented] Hair Growth on Extremity: [Right:Yes] Temperature of Extremity: [Right:Warm] Capillary Refill: [Right:< 3 seconds] Toe Nail Assessment Left: Right: Thick: Yes Discolored: Yes Deformed: Yes Improper Length and Hygiene: Yes Electronic Signature(s) Signed: 10/08/2017 4:28:38 PM By: Renne CriglerFlinchum, Cheryl Entered By: Renne CriglerFlinchum, Cheryl on 10/08/2017 09:22:26 [name], [name] ([id number]) -------------------------------------------------------------------------------- Multi Wound Chart Details Patient Name: [name], [name] Date of Service: 10/08/2017 9:15 AM Medical Record Number: [id number] Patient Account Number: [id number] Date of Birth/Sex: 1945-10-05 [age] y.o. Male) Treating RN: Renne CriglerFlinchum, Cheryl Primary Care Juliahna Wiswell: Darreld McleanMILES, LINDA Other Clinician: Referring Tyra Gural: Darreld McleanMILES, LINDA Treating  Wing Schoch/Extender: STONE III, HOYT Weeks in Treatment: 90 Vital Signs Height(in): 76 Pulse(bpm): 84 Weight(lbs): 238 Blood Pressure(mmHg): 138/75 Body Mass Index(BMI): 29 Temperature(F): 98.3 Respiratory Rate 18 (breaths/min): Photos: [1:No Photos] [2:No Photos] [N/A:N/A] Wound Location: [1:Right Malleolus - Lateral] [2:Right Malleolus - Medial] [N/A:N/A] Wounding Event: [1:Gradually Appeared] [2:Gradually Appeared] [N/A:N/A] Primary Etiology: [1:Venous Leg Ulcer] [2:Venous Leg Ulcer] [N/A:N/A] Comorbid History: [1:Cataracts, Chronic Obstructive Cataracts, Chronic Obstructive N/A Pulmonary Disease (COPD), Pulmonary Disease (COPD), Osteoarthritis] [2:Osteoarthritis] Date Acquired: [1:08/11/2015] [2:01/30/2016] [N/A:N/A] Weeks of Treatment: [1:90] [2:88] [N/A:N/A] Wound Status: [1:Open] [2:Open] [N/A:N/A] Clustered Wound: [1:No] [2:Yes] [N/A:N/A] Measurements L x W x D [1:6.1x6.1x0.3] [2:4x3.6x0.2] [N/A:N/A] (cm) Area (cm) : [1:29.225] [2:11.31] [N/A:N/A] Volume (cm) : [1:8.767] [2:2.262] [N/A:N/A] % Reduction  in Area: [1:-69.10%] [2:2.00%] [N/A:N/A] % Reduction in Volume: [1:-69.10%] [2:-95.80%] [N/A:N/A] Classification: [1:Full Thickness Without Exposed Support Structures] [2:Full Thickness Without Exposed Support Structures] [N/A:N/A] Exudate Amount: [1:Large] [2:Large] [N/A:N/A] Exudate Type: [1:Serous] [2:Serous] [N/A:N/A] Exudate Color: [1:amber] [2:amber] [N/A:N/A] Wound Margin: [1:Distinct, outline attached] [2:Distinct, outline attached] [N/A:N/A] Granulation Amount: [1:Medium (34-66%)] [2:Medium (34-66%)] [N/A:N/A] Granulation Quality: [1:Red] [2:Red] [N/A:N/A] Necrotic Amount: [1:Medium (34-66%)] [2:Medium (34-66%)] [N/A:N/A] Exposed Structures: [1:Fat Layer (Subcutaneous Tissue) Exposed: Yes Fascia: No Tendon: No Muscle: No Joint: No Bone: No] [2:Fat Layer (Subcutaneous Tissue) Exposed: Yes Fascia: No Tendon: No Muscle: No Joint: No Bone: No]  [N/A:N/A] Epithelialization: [1:None] [2:None] [N/A:N/A] Periwound Skin Texture: [1:Scarring: Yes Excoriation: No Induration: No] [2:Scarring: Yes Excoriation: No Induration: No] [N/A:N/A] Callus: No Callus: No Crepitus: No Crepitus: No Rash: No Rash: No Periwound Skin Moisture: Maceration: Yes Maceration: Yes N/A Dry/Scaly: No Dry/Scaly: No Periwound Skin Color: Erythema: Yes Erythema: Yes N/A Hemosiderin Staining: Yes Hemosiderin Staining: Yes Atrophie Blanche: No Atrophie Blanche: No Cyanosis: No Cyanosis: No Ecchymosis: No Ecchymosis: No Mottled: No Mottled: No Pallor: No Pallor: No Rubor: No Rubor: No Erythema Location: Circumferential Circumferential N/A Temperature: No Abnormality No Abnormality N/A Tenderness on Palpation: Yes Yes N/A Wound Preparation: Ulcer Cleansing: Ulcer Cleansing: N/A Rinsed/Irrigated with Saline Rinsed/Irrigated with Saline Topical Anesthetic Applied: Topical Anesthetic Applied: Other: lidocaine 4% Other: lidocaine 4% Treatment Notes Electronic Signature(s) Signed: 10/08/2017 4:28:38 PM By: Renne Crigler Entered By: Renne Crigler on 10/08/2017 09:22:59 [name] Stack ([id number]) -------------------------------------------------------------------------------- Multi-Disciplinary Care Plan Details Patient Name: [name] Stack Date of Service: 10/08/2017 9:15 AM Medical Record Number: [id number] Patient Account Number: [id number] Date of Birth/Sex: [birth year] (72 y.o. Male) Treating RN: Renne Crigler Primary Care Elliotte Marsalis: [name] Other Clinician: Referring Denaya Horn: [name] Treating Mata Rowen/Extender: STONE III, HOYT Weeks in Treatment: 90 Active Inactive ` Venous Leg Ulcer Nursing Diagnoses: Knowledge deficit related to disease process and management Goals: Patient will maintain optimal edema control Date Initiated: 04/03/2016 Target Resolution Date: 11/24/2016 Goal Status:  Active Interventions: Compression as ordered Treatment Activities: Therapeutic compression applied : 04/03/2016 Notes: ` Wound/Skin Impairment Nursing Diagnoses: Impaired tissue integrity Goals: Ulcer/skin breakdown will heal within 14 weeks Date Initiated: 01/17/2016 Target Resolution Date: 11/24/2016 Goal Status: Active Interventions: Assess ulceration(s) every visit Notes: Electronic Signature(s) Signed: 10/08/2017 4:28:38 PM By: Renne Crigler Entered By: Renne Crigler on 10/08/2017 09:22:47 [name] Stack ([id number]) -------------------------------------------------------------------------------- Pain Assessment Details Patient Name: [name] Stack Date of Service: 10/08/2017 9:15 AM Medical Record Number: [id number] Patient Account Number: [id number] Date of Birth/Sex: 02/05/1946 (72 y.o. Male) Treating RN: Renne Crigler Primary Care Emilina Smarr: [name] Other Clinician: Referring Javian Nudd: [name] Treating Tunya Held/Extender: STONE III, HOYT Weeks in Treatment: 90 Active Problems Location of Pain Severity and Description of Pain Patient Has Paino No Site Locations Pain Management and Medication Current Pain Management: Goals for Pain Management Topical or injectable lidocaine is offered to patient for acute pain when surgical debridement is performed. If needed, Patient is instructed to use over the counter pain medication for the following 24-48 hours after debridement. Wound care MDs do not prescribed pain medications. Patient has chronic pain or uncontrolled pain. Patient has been instructed to make an appointment with their Primary Care Physician for pain management. Electronic Signature(s) Signed: 10/08/2017 4:28:38 PM By: Renne Crigler Entered By: Renne Crigler on 10/08/2017 14:56:09 [name] Stack ([id number]) -------------------------------------------------------------------------------- Patient/Caregiver Education  Details Patient Name: [name] Stack Date of Service: 10/08/2017 9:15 AM Medical Record Number: [id number] Patient Account Number: [id number] Date of Birth/Gender:  04-09-1946 (72 y.o. Male) Treating RN: Renne Crigler Primary Care Physician: [name] Other Clinician: Referring Physician: [name] Treating Physician/Extender: Linwood Dibbles, HOYT Weeks in Treatment: 43 Education Assessment Education Provided To: Patient Education Topics Provided Wound Debridement: Handouts: Wound Debridement Methods: Explain/Verbal Responses: State content correctly Wound/Skin Impairment: Handouts: Caring for Your Ulcer Methods: Explain/Verbal Responses: State content correctly Electronic Signature(s) Signed: 10/08/2017 4:28:38 PM By: Renne Crigler Entered By: Renne Crigler on 10/08/2017 09:46:34 Kennerson, [name] ([id number]) -------------------------------------------------------------------------------- Wound Assessment Details Patient Name: [name] Stack Date of Service: 10/08/2017 9:15 AM Medical Record Number: [id number] Patient Account Number: [id number] Date of Birth/Sex: 06-May-1946 (72 y.o. Male) Treating RN: Renne Crigler Primary Care Dunbar Buras: [name] Other Clinician: Referring Lavaughn Haberle: [name] Treating Aki Burdin/Extender: STONE III, HOYT Weeks in Treatment: 90 Wound Status Wound Number: 1 Primary Venous Leg Ulcer Etiology: Wound Location: Right Malleolus - Lateral Wound Open Wounding Event: Gradually Appeared Status: Date Acquired: 08/11/2015 Comorbid Cataracts, Chronic Obstructive Pulmonary Weeks Of Treatment: 90 History: Disease (COPD), Osteoarthritis Clustered Wound: No Photos Photo Uploaded By: Renne Crigler on 10/08/2017 16:30:49 Wound Measurements Length: (cm) 6.1 Width: (cm) 6.1 Depth: (cm) 0.3 Area: (cm) 29.225 Volume: (cm) 8.767 % Reduction in Area: -69.1% % Reduction in Volume: -69.1% Epithelialization:  None Tunneling: No Undermining: No Wound Description Full Thickness Without Exposed Support Classification: Structures Wound Margin: Distinct, outline attached Exudate Large Amount: Exudate Type: Serous Exudate Color: amber Foul Odor After Cleansing: No Slough/Fibrino Yes Wound Bed Granulation Amount: Medium (34-66%) Exposed Structure Granulation Quality: Red Fascia Exposed: No Necrotic Amount: Medium (34-66%) Fat Layer (Subcutaneous Tissue) Exposed: Yes Necrotic Quality: Adherent Slough Tendon Exposed: No Muscle Exposed: No Joint Exposed: No Bone Exposed: No Camberos, Zebulan C. ([id number]) Periwound Skin Texture Texture Color No Abnormalities Noted: No No Abnormalities Noted: No Callus: No Atrophie Blanche: No Crepitus: No Cyanosis: No Excoriation: No Ecchymosis: No Induration: No Erythema: Yes Rash: No Erythema Location: Circumferential Scarring: Yes Hemosiderin Staining: Yes Mottled: No Moisture Pallor: No No Abnormalities Noted: No Rubor: No Dry / Scaly: No Maceration: Yes Temperature / Pain Temperature: No Abnormality Tenderness on Palpation: Yes Wound Preparation Ulcer Cleansing: Rinsed/Irrigated with Saline Topical Anesthetic Applied: Other: lidocaine 4%, Treatment Notes Wound #1 (Right, Lateral Malleolus) 1. Cleansed with: Clean wound with Normal Saline 2. Anesthetic Topical Lidocaine 4% cream to wound bed prior to debridement 4. Dressing Applied: Other dressing (specify in notes) 5. Secondary Dressing Applied ABD Pad Notes dakins, xtrasorb, kerlix and coban wrap with tape Electronic Signature(s) Signed: 10/08/2017 4:28:38 PM By: Renne Crigler Entered By: Renne Crigler on 10/08/2017 09:19:10 [name] Stack ([id number]) -------------------------------------------------------------------------------- Wound Assessment Details Patient Name: [name] Stack Date of Service: 10/08/2017 9:15 AM Medical Record Number:  [id number] Patient Account Number: [id number] Date of Birth/Sex: 1946-06-20 (72 y.o. Male) Treating RN: Renne Crigler Primary Care Arne Schlender: [name] Other Clinician: Referring Island Dohmen: [name] Treating Reality Dejonge/Extender: STONE III, HOYT Weeks in Treatment: 90 Wound Status Wound Number: 2 Primary Venous Leg Ulcer Etiology: Wound Location: Right Malleolus - Medial Wound Open Wounding Event: Gradually Appeared Status: Date Acquired: 01/30/2016 Comorbid Cataracts, Chronic Obstructive Pulmonary Weeks Of Treatment: 88 History: Disease (COPD), Osteoarthritis Clustered Wound: Yes Photos Photo Uploaded By: Renne Crigler on 10/08/2017 16:31:03 Wound Measurements Length: (cm) 4 Width: (cm) 3.6 Depth: (cm) 0.2 Area: (cm) 11.31 Volume: (cm) 2.262 % Reduction in Area: 2% % Reduction in Volume: -95.8% Epithelialization: None Tunneling: No Undermining: No Wound Description Full Thickness Without Exposed Support Classification: Structures Wound Margin: Distinct, outline attached Exudate Large Amount:  Exudate Type: Serous Exudate Color: amber Foul Odor After Cleansing: No Slough/Fibrino Yes Wound Bed Granulation Amount: Medium (34-66%) Exposed Structure Granulation Quality: Red Fascia Exposed: No Necrotic Amount: Medium (34-66%) Fat Layer (Subcutaneous Tissue) Exposed: Yes Necrotic Quality: Adherent Slough Tendon Exposed: No Muscle Exposed: No Joint Exposed: No Bone Exposed: No Neyhart, Galen C. ([id number]) Periwound Skin Texture Texture Color No Abnormalities Noted: No No Abnormalities Noted: No Callus: No Atrophie Blanche: No Crepitus: No Cyanosis: No Excoriation: No Ecchymosis: No Induration: No Erythema: Yes Rash: No Erythema Location: Circumferential Scarring: Yes Hemosiderin Staining: Yes Mottled: No Moisture Pallor: No No Abnormalities Noted: No Rubor: No Dry / Scaly: No Maceration: Yes Temperature / Pain Temperature: No  Abnormality Tenderness on Palpation: Yes Wound Preparation Ulcer Cleansing: Rinsed/Irrigated with Saline Topical Anesthetic Applied: Other: lidocaine 4%, Treatment Notes Wound #2 (Right, Medial Malleolus) 1. Cleansed with: Clean wound with Normal Saline 2. Anesthetic Topical Lidocaine 4% cream to wound bed prior to debridement 4. Dressing Applied: Other dressing (specify in notes) 5. Secondary Dressing Applied ABD Pad Notes dakins, xtrasorb, kerlix and coban wrap with tape Electronic Signature(s) Signed: 10/08/2017 4:28:38 PM By: Renne Crigler Entered By: Renne Crigler on 10/08/2017 09:19:47 [name] Stack ([id number]) -------------------------------------------------------------------------------- Vitals Details Patient Name: [name] Stack Date of Service: 10/08/2017 9:15 AM Medical Record Number: [id number] Patient Account Number: [id number] Date of Birth/Sex: 1946-04-09 (72 y.o. Male) Treating RN: Renne Crigler Primary Care Ranen Doolin: [name] Other Clinician: Referring Dauna Ziska: [name] Treating Hashim Eichhorst/Extender: STONE III, HOYT Weeks in Treatment: 90 Vital Signs Time Taken: 09:12 Temperature (F): 98.3 Height (in): 76 Pulse (bpm): 84 Weight (lbs): 238 Respiratory Rate (breaths/min): 18 Body Mass Index (BMI): 29 Blood Pressure (mmHg): 138/75 Reference Range: 80 - 120 mg / dl Electronic Signature(s) Signed: 10/08/2017 4:28:38 PM By: Renne Crigler Entered By: Renne Crigler on 10/08/2017 09:12:39

## 2017-10-15 ENCOUNTER — Encounter: Payer: Medicare HMO | Admitting: Physician Assistant

## 2017-10-15 DIAGNOSIS — I87331 Chronic venous hypertension (idiopathic) with ulcer and inflammation of right lower extremity: Secondary | ICD-10-CM | POA: Diagnosis not present

## 2017-10-16 NOTE — Progress Notes (Signed)
DREAM, NODAL ([id number]) Visit Report for 10/15/2017 Arrival Information Details Patient Name: [name], [name]. Date of Service: 10/15/2017 11:15 AM Medical Record Number: [id number] Patient Account Number: [id number] Date of Birth/Sex: 14-Sep-1945 (72 y.o. Male) Treating RN: Curtis Sites Primary Care Vista Sawatzky: [name] Other Clinician: Referring Finch Costanzo: [name] Treating Ketzia Guzek/Extender: Linwood Dibbles, HOYT Weeks in Treatment: 91 Visit Information History Since Last Visit Added or deleted any medications: No Patient Arrived: Ambulatory Any new allergies or adverse reactions: No Arrival Time: 11:19 Had a fall or experienced change in No Accompanied By: self activities of daily living that may affect Transfer Assistance: None risk of falls: Patient Identification Verified: Yes Signs or symptoms of abuse/neglect since last visito No Secondary Verification Process Yes Hospitalized since last visit: No Completed: Has Dressing in Place as Prescribed: Yes Patient Requires Transmission-Based No Has Compression in Place as Prescribed: Yes Precautions: Pain Present Now: No Patient Has Alerts: Yes Patient Alerts: Patient on Blood Thinner Electronic Signature(s) Signed: 10/15/2017 4:30:06 PM By: Curtis Sites Entered By: Curtis Sites on 10/15/2017 11:19:45 [name] ([id number]) -------------------------------------------------------------------------------- Encounter Discharge Information Details Patient Name: [name] Date of Service: 10/15/2017 11:15 AM Medical Record Number: [id number] Patient Account Number: [id number] Date of Birth/Sex: September 27, 1945 (72 y.o. Male) Treating RN: Huel Coventry Primary Care Obdulio Mash: [name] Other Clinician: Referring Kymber Kosar: [name] Treating Jaquel Coomer/Extender: Linwood Dibbles, HOYT Weeks in Treatment: 32 Encounter Discharge Information Items Discharge Pain Level: 0 Discharge Condition:  Stable Ambulatory Status: Ambulatory Discharge Destination: Home Private Transportation: Auto Accompanied By: self Schedule Follow-up Appointment: Yes Medication Reconciliation completed and provided Yes to Patient/Care Keylani Perlstein: Clinical Summary of Care: Electronic Signature(s) Signed: 10/15/2017 5:09:02 PM By: Elliot Gurney, BSN, RN, CWS, Kim RN, BSN Entered By: Elliot Gurney, BSN, RN, CWS, Kim on 10/15/2017 11:53:58 [name] ([id number]) -------------------------------------------------------------------------------- Lower Extremity Assessment Details Patient Name: [name] Date of Service: 10/15/2017 11:15 AM Medical Record Number: [id number] Patient Account Number: [id number] Date of Birth/Sex: 07/08/1946 (72 y.o. Male) Treating RN: Curtis Sites Primary Care Eliud Polo: [name] Other Clinician: Referring Alisha Bacus: [name] Treating Karoline Fleer/Extender: STONE III, HOYT Weeks in Treatment: 91 Vascular Assessment Pulses: Dorsalis Pedis Palpable: [Right:Yes] Posterior Tibial Extremity colors, hair growth, and conditions: Extremity Color: [Right:Hyperpigmented] Hair Growth on Extremity: [Right:No] Temperature of Extremity: [Right:Warm] Capillary Refill: [Right:< 3 seconds] Toe Nail Assessment Left: Right: Thick: Yes Discolored: Yes Deformed: Yes Improper Length and Hygiene: Yes Electronic Signature(s) Signed: 10/15/2017 4:30:06 PM By: Curtis Sites Entered By: Curtis Sites on 10/15/2017 11:31:12 [name] ([id number]) -------------------------------------------------------------------------------- Multi Wound Chart Details Patient Name: [name] Date of Service: 10/15/2017 11:15 AM Medical Record Number: [id number] Patient Account Number: [id number] Date of Birth/Sex: 1945-12-27 (72 y.o. Male) Treating RN: [name] Primary Care Yovanny Coats: [name] Other Clinician: Referring Dorene Bruni: [name] Treating  Lillie Bollig/Extender: STONE III, HOYT Weeks in Treatment: 91 Vital Signs Height(in): 76 Pulse(bpm): 83 Weight(lbs): 238 Blood Pressure(mmHg): 141/64 Body Mass Index(BMI): 29 Temperature(F): 98.1 Respiratory Rate 18 (breaths/min): Photos: [N/A:N/A] Wound Location: Right Malleolus - Lateral Right Malleolus - Medial N/A Wounding Event: Gradually Appeared Gradually Appeared N/A Primary Etiology: Venous Leg Ulcer Venous Leg Ulcer N/A Comorbid History: Cataracts, Chronic Obstructive Cataracts, Chronic Obstructive N/A Pulmonary Disease (COPD), Pulmonary Disease (COPD), Osteoarthritis Osteoarthritis Date Acquired: 08/11/2015 01/30/2016 N/A Weeks of Treatment: 91 89 N/A Wound Status: Open Open N/A Clustered Wound: No Yes N/A Measurements L x W x D 6.1x6.1x0.2 4.1x3.5x0.2 N/A (cm) Area (cm) : 29.225 11.27 N/A Volume (cm) : 5.845 2.254 N/A %  Reduction in Area: -69.10% 2.40% N/A % Reduction in Volume: -12.80% -95.20% N/A Classification: Full Thickness Without Full Thickness Without N/A Exposed Support Structures Exposed Support Structures Exudate Amount: Large Large N/A Exudate Type: Serous Serous N/A Exudate Color: amber amber N/A Wound Margin: Distinct, outline attached Distinct, outline attached N/A Granulation Amount: Medium (34-66%) Medium (34-66%) N/A Granulation Quality: Red Red N/A Necrotic Amount: Medium (34-66%) Medium (34-66%) N/A Exposed Structures: Fat Layer (Subcutaneous Fat Layer (Subcutaneous N/A Tissue) Exposed: Yes Tissue) Exposed: Yes Fascia: No Fascia: No Tendon: No Tendon: No [name], [name] Muscle: No Muscle: No Joint: No Joint: No Bone: No Bone: No Epithelialization: None None N/A Periwound Skin Texture: Scarring: Yes Scarring: Yes N/A Excoriation: No Excoriation: No Induration: No Induration: No Callus: No Callus: No Crepitus: No Crepitus: No Rash: No Rash: No Periwound Skin Moisture: Maceration: Yes Maceration: Yes  N/A Dry/Scaly: No Dry/Scaly: No Periwound Skin Color: Erythema: Yes Erythema: Yes N/A Hemosiderin Staining: Yes Hemosiderin Staining: Yes Atrophie Blanche: No Atrophie Blanche: No Cyanosis: No Cyanosis: No Ecchymosis: No Ecchymosis: No Mottled: No Mottled: No Pallor: No Pallor: No Rubor: No Rubor: No Erythema Location: Circumferential Circumferential N/A Temperature: No Abnormality No Abnormality N/A Tenderness on Palpation: Yes Yes N/A Wound Preparation: Ulcer Cleansing: Ulcer Cleansing: N/A Rinsed/Irrigated with Saline Rinsed/Irrigated with Saline Topical Anesthetic Applied: Topical Anesthetic Applied: Other: lidocaine 4% Other: lidocaine 4% Treatment Notes Electronic Signature(s) Signed: 10/15/2017 4:06:48 PM By: Alejandro Mulling Entered By: Alejandro Mulling on 10/15/2017 11:40:52 [name] ([id number]) -------------------------------------------------------------------------------- Multi-Disciplinary Care Plan Details Patient Name: [name] Date of Service: 10/15/2017 11:15 AM Medical Record Number: [id number] Patient Account Number: [id number] Date of Birth/Sex: August 01, 1946 (72 y.o. Male) Treating RN: [name] Primary Care Ellena Kamen: [name] Other Clinician: Referring Nasiah Lehenbauer: [name] Treating Soraya Paquette/Extender: STONE III, HOYT Weeks in Treatment: 91 Active Inactive ` Venous Leg Ulcer Nursing Diagnoses: Knowledge deficit related to disease process and management Goals: Patient will maintain optimal edema control Date Initiated: 04/03/2016 Target Resolution Date: 11/24/2016 Goal Status: Active Interventions: Compression as ordered Treatment Activities: Therapeutic compression applied : 04/03/2016 Notes: ` Wound/Skin Impairment Nursing Diagnoses: Impaired tissue integrity Goals: Ulcer/skin breakdown will heal within 14 weeks Date Initiated: 01/17/2016 Target Resolution Date: 11/24/2016 Goal Status:  Active Interventions: Assess ulceration(s) every visit Notes: Electronic Signature(s) Signed: 10/15/2017 4:06:48 PM By: Alejandro Mulling Entered By: Alejandro Mulling on 10/15/2017 11:40:36 [name] ([id number]) -------------------------------------------------------------------------------- Pain Assessment Details Patient Name: [name] Date of Service: 10/15/2017 11:15 AM Medical Record Number: [id number] Patient Account Number: [id number] Date of Birth/Sex: Jun 27, 1946 (72 y.o. Male) Treating RN: Curtis Sites Primary Care Greenleigh Kauth: [name] Other Clinician: Referring Deddrick Saindon: [name] Treating Claressa Hughley/Extender: STONE III, HOYT Weeks in Treatment: 91 Active Problems Location of Pain Severity and Description of Pain Patient Has Paino Yes Site Locations Pain Location: Pain in Ulcers With Dressing Change: Yes Duration of the Pain. Constant / Intermittento Intermittent Pain Management and Medication Current Pain Management: Electronic Signature(s) Signed: 10/15/2017 4:30:06 PM By: Curtis Sites Entered By: Curtis Sites on 10/15/2017 11:20:01 [name] ([id number]) -------------------------------------------------------------------------------- Patient/Caregiver Education Details Patient Name: [name] Date of Service: 10/15/2017 11:15 AM Medical Record Number: [id number] Patient Account Number: [id number] Date of Birth/Gender: [birth year] (72 y.o. Male) Treating RN: Huel Coventry Primary Care Physician: [name] Other Clinician: Referring Physician: [name] Treating Physician/Extender: Lenda Kelp Weeks in Treatment: 95 Education Assessment Education Provided To: Patient Education Topics Provided Wound/Skin Impairment: Handouts: Caring for Your Ulcer Methods: Demonstration, Explain/Verbal Responses: State  content correctly Electronic Signature(s) Signed: 10/15/2017 5:09:02 PM By: Elliot Gurney, BSN, RN, CWS, Kim RN,  BSN Entered By: Elliot Gurney, BSN, RN, CWS, Kim on 10/15/2017 11:54:13 [name] ([id number]) -------------------------------------------------------------------------------- Wound Assessment Details Patient Name: [name] Date of Service: 10/15/2017 11:15 AM Medical Record Number: [id number] Patient Account Number: [id number] Date of Birth/Sex: 1945/10/23 (72 y.o. Male) Treating RN: Curtis Sites Primary Care Paysen Goza: [name] Other Clinician: Referring Taevon Aschoff: [name] Treating Evangelynn Lochridge/Extender: STONE III, HOYT Weeks in Treatment: 91 Wound Status Wound Number: 1 Primary Venous Leg Ulcer Etiology: Wound Location: Right Malleolus - Lateral Wound Open Wounding Event: Gradually Appeared Status: Date Acquired: 08/11/2015 Comorbid Cataracts, Chronic Obstructive Pulmonary Weeks Of Treatment: 91 History: Disease (COPD), Osteoarthritis Clustered Wound: No Photos Photo Uploaded By: Curtis Sites on 10/15/2017 11:34:07 Wound Measurements Length: (cm) 6.1 Width: (cm) 6.1 Depth: (cm) 0.2 Area: (cm) 29.225 Volume: (cm) 5.845 % Reduction in Area: -69.1% % Reduction in Volume: -12.8% Epithelialization: None Tunneling: No Undermining: No Wound Description Full Thickness Without Exposed Support Classification: Structures Wound Margin: Distinct, outline attached Exudate Large Amount: Exudate Type: Serous Exudate Color: amber Foul Odor After Cleansing: No Slough/Fibrino Yes Wound Bed Granulation Amount: Medium (34-66%) Exposed Structure Granulation Quality: Red Fascia Exposed: No Necrotic Amount: Medium (34-66%) Fat Layer (Subcutaneous Tissue) Exposed: Yes Necrotic Quality: Adherent Slough Tendon Exposed: No Muscle Exposed: No Joint Exposed: No Bone Exposed: No Web, Delaine C. ([id number]) Periwound Skin Texture Texture Color No Abnormalities Noted: No No Abnormalities Noted: No Callus: No Atrophie Blanche: No Crepitus:  No Cyanosis: No Excoriation: No Ecchymosis: No Induration: No Erythema: Yes Rash: No Erythema Location: Circumferential Scarring: Yes Hemosiderin Staining: Yes Mottled: No Moisture Pallor: No No Abnormalities Noted: No Rubor: No Dry / Scaly: No Maceration: Yes Temperature / Pain Temperature: No Abnormality Tenderness on Palpation: Yes Wound Preparation Ulcer Cleansing: Rinsed/Irrigated with Saline Topical Anesthetic Applied: Other: lidocaine 4%, Treatment Notes Wound #1 (Right, Lateral Malleolus) 1. Cleansed with: Cleanse wound with antibacterial soap and water 4. Dressing Applied: Other dressing (specify in notes) 5. Secondary Dressing Applied ABD Pad Notes Dakins, xsorb, abd, kerlix Electronic Signature(s) Signed: 10/15/2017 4:30:06 PM By: Curtis Sites Entered By: Curtis Sites on 10/15/2017 11:30:20 [name] ([id number]) -------------------------------------------------------------------------------- Wound Assessment Details Patient Name: [name] Date of Service: 10/15/2017 11:15 AM Medical Record Number: [id number] Patient Account Number: [id number] Date of Birth/Sex: 02-12-1946 (73 y.o. Male) Treating RN: Curtis Sites Primary Care Adana Marik: [name] Other Clinician: Referring Marshe Shrestha: [name] Treating Leronda Lewers/Extender: STONE III, HOYT Weeks in Treatment: 91 Wound Status Wound Number: 2 Primary Venous Leg Ulcer Etiology: Wound Location: Right Malleolus - Medial Wound Open Wounding Event: Gradually Appeared Status: Date Acquired: 01/30/2016 Comorbid Cataracts, Chronic Obstructive Pulmonary Weeks Of Treatment: 89 History: Disease (COPD), Osteoarthritis Clustered Wound: Yes Photos Photo Uploaded By: Curtis Sites on 10/15/2017 11:34:08 Wound Measurements Length: (cm) 4.1 Width: (cm) 3.5 Depth: (cm) 0.2 Area: (cm) 11.27 Volume: (cm) 2.254 % Reduction in Area: 2.4% % Reduction in Volume:  -95.2% Epithelialization: None Tunneling: No Undermining: No Wound Description Full Thickness Without Exposed Support Classification: Structures Wound Margin: Distinct, outline attached Exudate Large Amount: Exudate Type: Serous Exudate Color: amber Foul Odor After Cleansing: No Slough/Fibrino Yes Wound Bed Granulation Amount: Medium (34-66%) Exposed Structure Granulation Quality: Red Fascia Exposed: No Necrotic Amount: Medium (34-66%) Fat Layer (Subcutaneous Tissue) Exposed: Yes Necrotic Quality: Adherent Slough Tendon Exposed: No Muscle Exposed: No Joint Exposed: No Bone Exposed: No [place], Rayfield C. ([id number]) Periwound Skin Texture Texture Color No Abnormalities  Noted: No No Abnormalities Noted: No Callus: No Atrophie Blanche: No Crepitus: No Cyanosis: No Excoriation: No Ecchymosis: No Induration: No Erythema: Yes Rash: No Erythema Location: Circumferential Scarring: Yes Hemosiderin Staining: Yes Mottled: No Moisture Pallor: No No Abnormalities Noted: No Rubor: No Dry / Scaly: No Maceration: Yes Temperature / Pain Temperature: No Abnormality Tenderness on Palpation: Yes Wound Preparation Ulcer Cleansing: Rinsed/Irrigated with Saline Topical Anesthetic Applied: Other: lidocaine 4%, Treatment Notes Wound #2 (Right, Medial Malleolus) 1. Cleansed with: Cleanse wound with antibacterial soap and water 4. Dressing Applied: Other dressing (specify in notes) 5. Secondary Dressing Applied ABD Pad Notes Dakins, xsorb, abd, kerlix Electronic Signature(s) Signed: 10/15/2017 4:30:06 PM By: Curtis Sitesorthy, Joanna Entered By: Curtis Sitesorthy, Joanna on 10/15/2017 11:30:46 [name], [name] ([id number]) -------------------------------------------------------------------------------- Vitals Details Patient Name: [name], [name] Date of Service: 10/15/2017 11:15 AM Medical Record Number: [id number] Patient Account Number: [id number] Date of Birth/Sex: 12/28/1945 [age]  y.o. Male) Treating RN: Curtis Sitesorthy, Joanna Primary Care Kathryn Linarez: Darreld McleanMILES, LINDA Other Clinician: Referring Jamari Moten: Darreld McleanMILES, LINDA Treating Fran Mcree/Extender: STONE III, HOYT Weeks in Treatment: 91 Vital Signs Time Taken: 11:20 Temperature (F): 98.1 Height (in): 76 Pulse (bpm): 83 Weight (lbs): 238 Respiratory Rate (breaths/min): 18 Body Mass Index (BMI): 29 Blood Pressure (mmHg): 141/64 Reference Range: 80 - 120 mg / dl Electronic Signature(s) Signed: 10/15/2017 4:30:06 PM By: Curtis Sitesorthy, Joanna Entered By: Curtis Sitesorthy, Joanna on 10/15/2017 11:22:15

## 2017-10-16 NOTE — Progress Notes (Signed)
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 clinic today feeling a lot better states the pain is a lot better but nothing specific really was done other than to rewrap the foot also noted that he had arterial studies ordered in August although these were never done. It is reasonable to go ahead and reorder these. 08/14/16; generally arrives in a better state today in terms of the wounds he has taken cefdinir for one week. Our intake nurse reports copious amounts of drainage but the patient is complaining of much less pain. He is not had his PT and INR checked and I've asked him to do this today or tomorrow. 08/24/2016 -- patient arrives today after 10 days and said he had a stomach upset. His  arterial study was done and I have reviewed this report and find it to be within normal limits. However I did not note any venous duplex studies for reflux, and Dr. Leanord Hawking may have ordered these in the past but I will leave it to him to decide if he needs these. The patient has finished his course of cefdinir. 08/28/16; patient arrives today again with copious amounts of thick really green drainage for our intake nurse. He states he has a very tender spot at  the superior part of the lateral wound. Wounds are larger 09/04/16; no real change in the condition of this patient's wound still copious amounts of surface slough. Started him on Iodoflex last week he is completing another course of Cefdinir or which I think was done empirically. His arterial study showed ABIs were 1.1 on the right 1.5 on the left. He did have a slightly reduced ABI in the right the left one was not obtained. Had calcification of the right posterior tibial artery. The interpretation was no segmental stenosis. His waveforms were triphasic. [name], [name] ([id number]) His reflux studies are later this month. Depending on this I'll send him for a vascular consultation, he may need to see plastic surgery as I believe he is had plastic surgery on this foot in the past. He had an injury to the foot in the 1980s. 1/16 /18 right lateral greater than right medial ankle wounds on the right in the setting of previous skin grafting. 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 He does not have arterial issues. Each week he comes in with the same adherent surface slough although there was less of this today 09/18/16; right lateral greater than right medial lower extremity wounds in the setting of previous skin grafting and trauma. He has least to vein laser ablation scheduled for February 2 for venous reflux. He does not have significant arterial  disease. Problem has been very difficult to handle surface slough/necrotic tissue. Recently using Iodoflex for this with some, albeit slow improvement 09/25/16; right lateral greater than right medial lower extremity venous wounds in the setting of previous skin grafting. He is going for ablation surgery on February 2 after this he'll come back here for rewrap. He has been using Iodoflex as the primary dressing. 10/02/16; right lateral greater than right medial lower extremity wounds in the setting of previous skin grafting. He had his ablation surgery last week, I don't have a report. He tolerated this well. Came in with a thigh-high Unna boots on Friday. We have been using Iodoflex as the primary dressing. His measurements are improving 10/09/16; continues to make nice aggressive in terms of the wounds on his lateral and medial right ankle in the setting of previous skin grafting. Yesterday he noticed drainage at one of his surgical sites from his venous ablation on the right calf. He took off the bandage over this area felt a "popping" sensation and a reddish-brown drainage. He is not complaining of any pain 10/16/16; he continues to make nice progression in terms of the wounds on the lateral and medial malleolus. Both smaller using Iodoflex. He had a surgical area in his posterior mid calf we have been using iodoform. All the wounds are down and dimensions 10/23/16; the patient arrives today with no complaints. He states the Iodoflex is a bit uncomfortable. He is not systemically unwell. We have been using Iodoflex to the lateral right ankle and the medial and Aquacel Ag to the reflux surgical wound on the posterior right calf. All of these wounds are doing well 10/30/16; patient states he has no pain no systemic symptoms. I changed him to [facility] last week. Although the wounds are doing well 11/06/16; patient reports no pain or systemic symptoms. We continue with Hydrofera Blue. Both wound  areas on the medial and lateral ankle appear to be doing well with improvement and dimensions and improvement in the  wound bed. 11/13/16; patient's dimensions continued to improve. We continue with Hydrofera Blue on the medial and lateral side. Appear to be doing well with healthy granulation and advancing epithelialization 11/20/16; patient's dimensions improving laterally by about half a centimeter in length. Otherwise no change on the medial side. Using [facility] 12/04/16; no major change in patient's wound dimensions. Intake nurse reports more drainage. 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 He is voicing no complaints or concerns. He has been tolerating his twice weekly compression therapy changes 12/11/16 Patient complains of pain and increased drainage.. wants hydrofera blue 12/18/16 improvement. Sorbact 12/25/16; medial wound is smaller, lateral measures the same. Still on sorbact 01/01/17; medial wound continues to be smaller, lateral measures about the same however there is clearly advancing epithelialization here as well in fact I think the wound will ultimately divided into 2 open areas 01/08/17; unfortunately today fairly significant regression in several areas. Surface of the lateral wound covered again in adherent necrotic material which is difficult to debridement. He has significant surrounding skin maceration. The expanding area of tissue epithelialization in the middle of the wound that was encouraging last week appears to be smaller. There is no surrounding tenderness. The area on the medial leg also did not seem to be as healthy as last week, the reason for this regression this week is not totally clear. We have been using Sorbact for the last 4 weeks. We'll switched of polymen AG which we will order via home medical supply. If there is a problem with this would switch  back to Iodoflex 01/15/18; drainage,odor. No change. Switched to polymen last week 01/22/17; still continuous drainage. Culture I did last week showed a few Proteus pansensitive. I did this culture because of drainage. Put him on Augmentin which she has been taking since Saturday however he is developed 4-5 liquid bowel movements. He is also on Coumadin. Beyond this wound is not changed at all, still nonviable necrotic surface material which I debrided reveals healthy granulation line 01/29/17; still copious amounts of drainage reported by her intake nurse. Wound measuring slightly smaller. Currently fact on Iodoflex although I'm looking forward to changing back to perhaps [facility] or polymen AG 02/05/17; still large amounts of drainage and presenting with really large amounts of adherent slough and necrotic material over the remaining open area of the wound. We have been using Iodoflex but with little improvement in the surface. Change to [facility] 02/12/17; still large amount of drainage. Much less adherent slough however. Started [employer]	[place] last week BRENDON, CHRISTOFFEL ([id number]) 02/19/17; drainage is better this week. Much less adherent slough. Perhaps some improvement in dimensions. Using [facility] 02/26/17; severe venous insufficiency wounds on the right lateral and right medial leg. Drainage is some better and slough is less adherent we've been using Hydrofera Blue 03/05/17 on evaluation today patient appears to be doing well. His wounds have been decreasing in size and overall he is pleased with how this is progressing. We are awaiting approval for the epigraph which has previously been recommended in the meantime the [facility] Dressing is to be doing very well for him. 03/12/17; wound dimensions are smaller still using Hydrofera Blue. Comes in on Fridays for a dressing change. 03/19/17; wound dimensions continued to contract. Healing of this wound is complicated by  continuous significant drainage as well as recurrent buildup of necrotic surface material. We looked into  Apligraf and he has a $290 co-pay per application but truthfully I think the drainage as well as the nonviable surface would preclude use of Apligraf there are any other skin substitute at this point therefore the continued plan will be debridement each clinic visit, 2 times a week dressing changes and continued use of Hydrofera Blue. improvement has been very slow but sustained 03/26/17 perhaps slight improvements in peripheral epithelialization is especially inferiorly. Still with large amount of drainage and tightly adherent necrotic surface on arrival. Along with the intake nurse I reviewed previous treatment. He worsened on Iodoflex and had a 4 week trial of sorbact, Polymen AG and a long courses of Aquacel Ag. He is not a candidate for advanced treatment options for many reasons 04/09/17 on evaluation today patient's right lower extremity wounds appear to be doing a little better. Fortunately he has no significant discomfort and has been tolerating the dressing changes including the wraps without complication. With that being said he really does not have swelling anymore compared to what he has had in the past following his vascular intervention. He wonders if potentially we could attempt avoiding the rats to see if he could cleanse the wound in between to try to prevent some of the fiber and its buildup that occurs in the interim between when we see him week to week. No fevers, chills, nausea, or vomiting noted at this time. 04/16/17; noted that the staff made a choice last week not to put him in compression. The patient is changing his dressing at home using [facility] and changing every second day. His wife is washing the wound with saline. He is using kerlix. Surprisingly he has not developed a lot of edema. This choice was made because of the degree of fluid retention and maceration  even with changing his dressing twice a week 04/23/17; absolutely no change. Using Hydrofera Blue. Recurrent tightly adherent nonviable surface material. This is been refractory to Iodoflex, sorbact and now Hydrofera Blue. More recently he has been changing his own dressings at home, cleansing the wound in the shower. He has not developed lower extremity edema 04/30/17; if anything the wound is larger still in adherent surface although it debrided easily today. I've been using Mehta honey for 1 week and I'd like to try and do it for a second week this see if we can get some form of viable surface here. 05/07/17; patient arrives with copious amounts of drainage and some pain in the superior part of the wound. He has not been systemically unwell. He's been changing this daily at home 05/14/17; patient arrives today complaining of less drainage and less pain. Dimensions slightly better. Surface culture I did of this last week grew MSSA I put him on dicloxacillin for 10 days. Patient requesting a further prescription since he feels so much better. He did not obtain the [facility] AG for reasons that aren't clear, he has been using Hydrofera Blue 05/21/17; perhaps some less drainage and less pain. He is completing another week of doxycycline. Unfortunately there is no change in the wound measurements are appearance still tightly adherent necrotic surface material that is really defied treatment. We have been using Hydrofera Blue most recently. He did not manage to get Anasept. He was told it was prescription at [facility] 05/29/17; absolutely no improvement in these wound areas. He had remote plastic surgery with who was done at [facility] in [place] surrounding this area. This was a traumatic wound with extensive plastic surgery/skin grafting at  that time. I switched him to sorbact last week to see if he can do anything about the recurrent necrotic surface and drainage. This is largely been refractory to  Iodoflex/Hydrofera Blue/alginates. I believe we tried Elby [employer]ers and more recently Medi honey l however the drainage is really too excessive 06/04/17; patient went to see Dr. Ulice Bold. Per the patient she ordered him a compression stocking. Continued the Anasept gel and sorbact was already on. No major improvement in the condition of the wounds in fact the medial wound looks larger. Again per the patient she did not feel a skin graft or operative debridement was indicated The patient had previous venous ablation in February 2018. Last arterial studies were in December 2017 and were within normal limits 06/18/17; patient arrives back in clinic today using Anasept gel with sorbact. He changes this every day is wearing his own compression stocking. The patient actually saw Dr. Leta Baptist of plastic surgery. I've reviewed her note. The appointment was on 05/30/17. The basic issue with here was that she did not feel that skin grafts for patients with underlying stasis and edema have a good long-term success rate. She also discuss skin substitutes which has been done here as well however I have not been able to get the surface of these wounds to something that I think would support skin substitutes. This is why he felt he might need an [name], [name]. ([id number]) operative debridement more aggressively than I can do in this clinic Review of systems; is otherwise negative he feels well 07/02/17; patient has been using Anasept gel with Sorbact. He changes this every day scrubs out the wound beds. Arrives today with the wound bed looking some better. Easier debridement 07/16/17; patient has been using Anasept gel was sorbact for about a month now. The necrotic service on his wound bed is better and the drainage is now down to minimal but we've not made any improvements in the epithelialization. Change to Santyl today. Surface of the wound is still not good enough to support skin substitutes 07/30/17 on  evaluation today patient appears to be doing very well in regard to his right bimalleolus wounds. He has been tolerating the treatment with the Anasept gel and sorbact. However we were going to try and switch to Santyl to be more aggressive with these wounds. This was however cost prohibitive. Therefore we will likely go back to the sorbact. Nonetheless he is not having any significant discomfort he still is forming slough over the wound location but nothing too significant. The wounds do appear to be filling in nicely. 08/13/17; I have not seen this wound in about a month. There is not much change. The fact the area medially looks larger. He has been using sorbact and Anasept for over a month now without much effect. Arrives in clinic stating that he does not want the area debrided 08/28/17; patient arrives with the areas on his right medial and right lateral ankle worse. The wounds of expanded there is erythema and still drainage. There is no pain and no tenderness. We had been using Keracel AG clearly not doing well. 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 He states that even the evening that we began utilizing the  Iodoflex he started having burning. Subsequently this never really improved and he eventually discontinue the use of the Iodoflex altogether. Unfortunately he did not let us know about any of this until today. I'm unsure of any specific infection at this point although I am going to obtain a wound culture today in order to see if there's anything that could potentially be causing an infection as well. It does sound as if he's had the Iodoflex before and did not have this kind of reaction although this does sound very  specific for a reaction to the Iodoflex. 09/17/17 on evaluation today patient appears to be doing significantly better compared to last week's evaluation. At that time it actually appears that he did have an infection the good news is that we have been able to get this under control with switching to the [facility] solution he's not having as much pain and discomfort he still does have some erythema surrounding the medial aspect wound. He has been tolerating the Dakin's soaked gauze dressing which is excellent and that his wounds do not seem to have as much adherent slough. Overall I'm pleased with the progress she's made in last week. 10/08/17 on evaluation today patient appears to be doing well in regard to his right medial and lateral malleolus ulcers. With that being said he has started to develop some green drainage at this point again. He tells me that this showed up just shortly after he quit taking his antibiotics. I do feel like that he has an underlying infection that even 19 day course of antibiotic therapy does not seem to have fully resolved. Today we discussed potentially doing a 30 day course of antibiotics to try to see if we can completely knock this out. He is in agreement with the plan. 10/15/17 on evaluation today Mr. Humbarger presents for follow-up concerning his right medial and lateral malleolus ulcers. He does seem to be doing better since I placed him on the antibiotics last week. He was starting to develop infection again I do believe that has seem to resolve at this point. Fortunately he has no evidence of infection which is great news. Overall he did not have any significant discomfort he does have some swelling but he's been doing more work cleaning out his mother's house as well as car and otherwise due to her having somewhat recently passed. Patient History Information obtained from Patient. Social History Former smoker - 10 years ago, Marital Status - Married, Alcohol Use  - Moderate, Drug Use - No History, Caffeine Use - Never. Medical And Surgical History Notes Constitutional Symptoms (General Health) Vein Filter (groin area); CODA; H/O Blood Clots; pulmonary hypertensive arterial disease Review of Systems (ROS) [name], Ab C. ([id number]) Constitutional Symptoms (General Health) Denies complaints or symptoms of Fever, Chills. Respiratory The patient has no complaints or symptoms. Cardiovascular Complains or has symptoms of LE edema. Psychiatric The patient has no complaints or symptoms. Objective Constitutional Well-nourished and well-hydrated in no acute distress. Vitals Time Taken: 11:20 AM, Height: 76 in, Weight: 238 lbs, BMI: 29, Temperature: 98.1 F, Pulse: 83 bpm, Respiratory Rate: 18 breaths/min, Blood Pressure: 141/64 mmHg. Respiratory normal breathing without difficulty. Psychiatric this patient is able to make decisions and demonstrates good insight into disease process. Alert and Oriented x 3. pleasant and cooperative. General Notes: Patient's wound did appear to show signs of Slough covering although not as quickly as last week and it did not appear to be any purulent discharge which is excellent news. He  has been tolerating the dressing changes with the Dakin excellently at this point. He is still very pleased with how this has been going. Sharp debridement was required and was performed today without complication. Post debridement patients wound appeared to be doing much better. Integumentary (Hair, Skin) Wound #1 status is Open. Original cause of wound was Gradually Appeared. The wound is located on the Right,Lateral Malleolus. The wound measures 6.1cm length x 6.1cm width x 0.2cm depth; 29.225cm^2 area and 5.845cm^3 volume. There is Fat Layer (Subcutaneous Tissue) Exposed exposed. There is no tunneling or undermining noted. There is a large amount of serous drainage noted. The wound margin is distinct with the outline attached  to the wound base. There is medium (34- 66%) red granulation within the wound bed. There is a medium (34-66%) amount of necrotic tissue within the wound bed including Adherent Slough. The periwound skin appearance exhibited: Scarring, Maceration, Hemosiderin Staining, Erythema. The periwound skin appearance did not exhibit: Callus, Crepitus, Excoriation, Induration, Rash, Dry/Scaly, Atrophie Blanche, Cyanosis, Ecchymosis, Mottled, Pallor, Rubor. The surrounding wound skin color is noted with erythema which is circumferential. Periwound temperature was noted as No Abnormality. The periwound has tenderness on palpation. Wound #2 status is Open. Original cause of wound was Gradually Appeared. The wound is located on the Right,Medial Malleolus. The wound measures 4.1cm length x 3.5cm width x 0.2cm depth; 11.27cm^2 area and 2.254cm^3 volume. There is Fat Layer (Subcutaneous Tissue) Exposed exposed. There is no tunneling or undermining noted. There is a large amount of serous drainage noted. The wound margin is distinct with the outline attached to the wound base. There is medium (34- 66%) red granulation within the wound bed. There is a medium (34-66%) amount of necrotic tissue within the wound bed including Adherent Slough. The periwound skin appearance exhibited: Scarring, Maceration, Hemosiderin Staining, Erythema. The periwound skin appearance did not exhibit: Callus, Crepitus, Excoriation, Induration, Rash, Dry/Scaly, Atrophie Blanche, Cyanosis, Ecchymosis, Mottled, Pallor, Rubor. The surrounding wound skin color is noted with erythema which is [name], [name] circumferential. Periwound temperature was noted as No Abnormality. The periwound has tenderness on palpation. 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 There was a Skin/Subcutaneous Tissue Debridement ([id number]) debridement with total area of 37.21 sq cm performed by STONE III, Caci Orren E., PA-C. with the following instrument(s): Curette to remove Viable and Non-Viable tissue/material including Exudate, Fibrin/Slough, and Subcutaneous after achieving pain control using Lidocaine 4% Topical Solution. A time out was conducted at 11:40, prior to the start of the procedure. A Minimum amount of bleeding was controlled with Pressure. The procedure was tolerated well with a pain level of 0 throughout and a pain level of 0 following the procedure. Post Debridement Measurements: 6.1cm length x 6.1cm width x 0.3cm depth; 8.767cm^3 volume. Character of Wound/Ulcer Post Debridement requires further debridement. Severity of Tissue Post Debridement is: Fat layer exposed. Post procedure Diagnosis Wound #1: Same as Pre-Procedure  Wound #2 Pre-procedure diagnosis of Wound #2 is a Venous Leg Ulcer located on the Right,Medial Malleolus .Severity of Tissue Pre Debridement is: Fat layer exposed. There was a Skin/Subcutaneous Tissue Debridement ([id number]) debridement with total area of 14.35 sq cm performed by STONE III, Mihira Tozzi E., PA-C. with the following instrument(s): Curette to remove Viable and Non-Viable tissue/material including Exudate, Fibrin/Slough, and Subcutaneous after achieving pain control using Lidocaine  4% Topical Solution. A time out was conducted at 11:40, prior to the start of the procedure. A Minimum amount of bleeding was controlled with Pressure. The procedure was tolerated well with a pain level of 0 throughout and a pain level of 0 following the procedure. Post Debridement Measurements: 4.1cm length x 3.5cm width x 0.3cm depth; 3.381cm^3 volume. Character of Wound/Ulcer Post Debridement requires further debridement. Severity of Tissue Post Debridement is: Fat layer exposed. Post procedure Diagnosis Wound #2: Same as Pre-Procedure Plan Wound Cleansing: Wound #1 Right,Lateral Malleolus: Hem, Cisco C. ([id number]) Clean wound with Normal Saline. Cleanse wound with mild soap and water May Shower, gently pat wound dry prior to applying new dressing. Wound #2 Right,Medial Malleolus: Clean wound with Normal Saline. Cleanse wound with mild soap and water May Shower, gently pat wound dry prior to applying new dressing. Anesthetic (add to Medication List): Wound #1 Right,Lateral Malleolus: Topical Lidocaine 4% cream applied to wound bed prior to debridement (In Clinic Only). Wound #2 Right,Medial Malleolus: Topical Lidocaine 4% cream applied to wound bed prior to debridement (In Clinic Only). Skin Barriers/Peri-Wound Care: Wound #1 Right,Lateral Malleolus: Barrier cream Wound #2 Right,Medial Malleolus: Barrier cream Primary Wound Dressing: Wound #1 Right,Lateral Malleolus: Other: - Dakins Solution Wound #2 Right,Medial Malleolus: Other: - Dakins Solution Secondary Dressing: Wound #1 Right,Lateral Malleolus: ABD pad Dry Gauze Conform/Kerlix XtraSorb Wound #2 Right,Medial Malleolus: ABD pad Dry Gauze Conform/Kerlix XtraSorb Dressing Change Frequency: Wound #1 Right,Lateral Malleolus: Change dressing every day. Wound #2 Right,Medial Malleolus: Change dressing every day. Follow-up Appointments: Wound #1 Right,Lateral Malleolus: Return Appointment in 1 week. Wound  #2 Right,Medial Malleolus: Return Appointment in 1 week. Edema Control: Wound #1 Right,Lateral Malleolus: Elevate legs to the level of the heart and pump ankles as often as possible Wound #2 Right,Medial Malleolus: Elevate legs to the level of the heart and pump ankles as often as possible Additional Orders / Instructions: Wound #1 Right,Lateral Malleolus: Increase protein intake. Wound #2 Right,Medial Malleolus: Increase protein intake. Medications-please add to medication list.: Wound #1 Right,Lateral Malleolus: Other: - Vitamin C, Zinc, MVI Wound #2 Right,Medial Malleolus: Other: - Vitamin C, Zinc, MVI The following medication(s) was prescribed: lidocaine topical 4 % cream 1 1 cream topical was prescribed at facility [facility]. ([id number]) I am going to recommend that we continue with the Current wound care measures for the next week. Patient is in agreement with the plan. We will see were things stand at that point. If anything changes in the interim he will let us know. Otherwise for today I recommended that he try to get to the point where he could elevate his right leg in order to help with some of the fluid as he does seem to be somewhat swollen. Please see above for specific wound care orders. We will see patient for re-evaluation in 1 week(s) here in the clinic. If anything worsens or changes patient will contact our office for additional recommendations. Electronic Signature(s) Signed: 10/15/2017 5:27:42 PM By: Lenda Kelp PA-C Entered By: Lenda Kelp on 10/15/2017 11:58:22 [name] ([id number]) -------------------------------------------------------------------------------- ROS/PFSH  Details Patient Name: [name], KOBER. Date of Service: 10/15/2017 11:15 AM Medical Record Number: [id number] Patient Account Number: [id number] Date of Birth/Sex: [birth year] (72 y.o. Male) Treating RN: Ashok Cordia, [name] Primary Care Provider: [name] Other  Clinician: Referring Provider: [name] Treating Provider/Extender: STONE III, Nadene Witherspoon Weeks in Treatment: 20 Information Obtained From Patient Wound History Do you currently have one or more open woundso Yes How many open wounds do you currently haveo 1 Approximately how long have you had your woundso 5 months How have you been treating your wound(s) until nowo saline, dressing Has your wound(s) ever healed and then re-openedo Yes Have you had any lab work done in the past montho No Have you tested positive for an antibiotic resistant organism (MRSA, VRE)o No Have you tested positive for osteomyelitis (bone infection)o No Have you had any tests for circulation on your legso No Constitutional Symptoms (General Health) Complaints and Symptoms: Negative for: Fever; Chills Medical History: Past Medical History Notes: Vein Filter (groin area); CODA; H/O Blood Clots; pulmonary hypertensive arterial disease Cardiovascular Complaints and Symptoms: Positive for: LE edema Medical History: Negative for: Angina; Arrhythmia; Congestive Heart Failure; Coronary Artery Disease; Deep Vein Thrombosis; Hypertension; Hypotension; Myocardial Infarction; Peripheral Arterial Disease; Peripheral Venous Disease; Phlebitis; Vasculitis Eyes Medical History: Positive for: Cataracts - removed Negative for: Glaucoma; Optic Neuritis Ear/Nose/Mouth/Throat Medical History: Negative for: Chronic sinus problems/congestion Hematologic/Lymphatic Medical History: Negative for: Anemia; Hemophilia; Human Immunodeficiency Virus; Lymphedema; Sickle Cell Disease Respiratory Macdowell, [name] C. ([id number]) Complaints and Symptoms: No Complaints or Symptoms Medical History: Positive for: Chronic Obstructive Pulmonary Disease (COPD) Negative for: Aspiration; Asthma; Pneumothorax; Sleep Apnea; Tuberculosis Gastrointestinal Medical History: Negative for: Cirrhosis ; Colitis; Crohnos; Hepatitis A; Hepatitis B;  Hepatitis C Endocrine Medical History: Negative for: Type I Diabetes; Type II Diabetes Genitourinary Medical History: Negative for: End Stage Renal Disease Immunological Medical History: Negative for: Lupus Erythematosus; Raynaudos Integumentary (Skin) Medical History: Negative for: History of Burn; History of pressure wounds Musculoskeletal Medical History: Positive for: Osteoarthritis Negative for: Gout; Rheumatoid Arthritis; Osteomyelitis Neurologic Medical History: Negative for: Dementia; Neuropathy; Quadriplegia; Paraplegia; Seizure Disorder Oncologic Medical History: Negative for: Received Chemotherapy; Received Radiation Psychiatric Complaints and Symptoms: No Complaints or Symptoms Medical History: Negative for: Anorexia/bulimia; Confinement Anxiety HBO Extended History Items Eyes: Cataracts Kroner, Grayton C. ([id number]) Immunizations Pneumococcal Vaccine: Received Pneumococcal Vaccination: No Implantable Devices Family and Social History Former smoker - 10 years ago; Marital Status - Married; Alcohol Use: Moderate; Drug Use: No History; Caffeine Use: Never; Advanced Directives: No; Patient does not want information on Advanced Directives; Living Will: No; Medical Power of Attorney: No Physician Affirmation I have reviewed and agree with the above information. Electronic Signature(s) Signed: 10/15/2017 4:06:48 PM By: Alejandro Mulling Signed: 10/15/2017 5:27:42 PM By: Lenda Kelp PA-C Entered By: Lenda Kelp on 10/15/2017 11:57:21 [name] ([id number]) -------------------------------------------------------------------------------- SuperBill Details Patient Name: [name] Date of Service: 10/15/2017 Medical Record Number: [id number] Patient Account Number: [id number] Date of Birth/Sex: 12-30-1945 (72 y.o. 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 and decellularized allodermis, initial encounter T81.31XD Disruption of external operation (surgical) wound, not elsewhere  classified, subsequent encounter L97.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 of skin Facility Procedures CPT4 Code: 16109604 Description: 11042 - DEB SUBQ TISSUE 20 SQ CM/< ICD-10 Diagnosis Description L97.212 Non-pressure chronic ulcer of right calf with fat layer expos Modifier: ed Quantity: 1 CPT4 Code: 54098119 Description: 11045 - DEB SUBQ TISS EA ADDL 20CM ICD-10 Diagnosis Description L97.212 Non-pressure chronic ulcer of right calf with fat layer expos Modifier: ed Quantity: 2 Physician Procedures CPT4 Code: 1478295 Description: 11042 - WC PHYS SUBQ TISS 20 SQ CM ICD-10 Diagnosis Description L97.212 Non-pressure chronic ulcer of right calf with fat layer expose Modifier: d Quantity: 1 CPT4 Code: 6213086 Description: 11045 - WC PHYS SUBQ TISS EA ADDL 20 CM ICD-10 Diagnosis Description L97.212 Non-pressure chronic ulcer of right calf with fat layer expose Modifier: d Quantity: 2 Electronic Signature(s) Signed: 10/15/2017 5:27:42 PM By: Lenda Kelp PA-C Entered By: Lenda Kelp on 10/15/2017 11:58:48

## 2017-10-22 ENCOUNTER — Encounter: Payer: Medicare HMO | Admitting: Physician Assistant

## 2017-10-22 DIAGNOSIS — I87331 Chronic venous hypertension (idiopathic) with ulcer and inflammation of right lower extremity: Secondary | ICD-10-CM | POA: Diagnosis not present

## 2017-10-24 NOTE — Progress Notes (Signed)
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 on 12/8. A culture was done of the lateral wound that showed a combination of a few Proteus and Pseudomonas. Doxycycline might of covered the Proteus but would be unlikely to cover the Pseudomonas. He is on Coumadin. He arrives in the clinic today feeling a lot better states the pain is a lot better but nothing specific really was done other than to rewrap the foot also noted that he had arterial studies ordered in August although these were never done. It is  reasonable to go ahead and reorder these. 08/14/16; generally arrives in a better state today in terms of the wounds he has taken cefdinir for one week. Our intake nurse reports copious amounts of drainage but the patient is complaining of much less pain. He is not had his PT and INR checked and I've asked him to do this today or tomorrow. 08/24/2016 -- patient arrives today after 10 days and said he had a stomach upset. His arterial study was done and I have reviewed this report  and find it to be within normal limits. However I did not note any venous duplex studies for reflux, and Dr. Leanord Hawking may have ordered these in the past but I will leave it to him to decide if he needs these. The patient has finished his course of cefdinir. 08/28/16; patient arrives today again with copious amounts of thick really green drainage for our intake nurse. He states he has a very tender spot at the superior part of the lateral wound. Wounds are larger 09/04/16; no real change in the condition of this patient's wound still copious amounts of surface slough. Started him on Iodoflex last week he is completing another course of Cefdinir or which I think was done empirically. His arterial study showed ABIs were 1.1 on the right 1.5 on the left. He did have a slightly reduced ABI in the right the left one was not obtained. Had calcification of the right posterior tibial artery. The interpretation was no segmental stenosis. His waveforms were triphasic. DARION, MILEWSKI ([id number]) His reflux studies are later this month. Depending on this I'll send him for a vascular consultation, he may need to see plastic surgery as I believe he is had plastic surgery on this foot in the past. He had an injury to the foot in the 1980s. 1/16 /18 right lateral greater than right medial ankle wounds on the right in the setting of previous skin grafting. 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 He does not have arterial issues. Each week he comes in with the same adherent surface slough although there was less of this today 09/18/16; right lateral greater than right medial lower extremity wounds in the setting of previous skin grafting and trauma. He has least to vein laser ablation scheduled for February 2 for venous reflux. He does not have significant arterial disease. Problem has been very difficult to handle surface slough/necrotic tissue. Recently using Iodoflex for this with some, albeit slow improvement 09/25/16; right lateral greater than right medial lower extremity venous wounds in the setting of previous skin grafting. He is going for ablation surgery on February 2 after this he'll come back here for rewrap. He has been using Iodoflex as the primary dressing. 10/02/16; right lateral greater than right medial lower extremity wounds in the setting of previous skin grafting. He had his ablation surgery last week, I don't have a report. He tolerated this well. Came in with a thigh-high Unna boots on Friday. We have been using Iodoflex as the primary dressing. His measurements are improving 10/09/16; continues to make nice aggressive in terms of the wounds on his lateral and medial right ankle in the setting of previous skin grafting. Yesterday he noticed drainage at one of his surgical sites from his venous ablation on the right calf. He took off the bandage over this area felt a "popping" sensation and a reddish-brown drainage. He is not complaining of any pain 10/16/16; he continues to make nice progression in terms of the wounds on the lateral and medial malleolus. Both smaller using Iodoflex. He had a surgical area in his posterior mid calf we have been using iodoform. All the wounds are down and dimensions 10/23/16; the patient arrives today with no complaints. He states the Iodoflex is a bit uncomfortable. He is not systemically unwell. We  have been using Iodoflex to the lateral right ankle and the medial and Aquacel Ag  to the reflux surgical wound on the posterior right calf. All of these wounds are doing well 10/30/16; patient states he has no pain no systemic symptoms. I changed him to [facility] last week. Although the wounds are doing well 11/06/16; patient reports no pain or systemic symptoms. We continue with Hydrofera Blue. Both wound areas on the medial and lateral ankle appear to be doing well with improvement and dimensions and improvement in the wound bed. 11/13/16; patient's dimensions continued to improve. We continue with Hydrofera Blue on the medial and lateral side. Appear to be doing well with healthy granulation and advancing epithelialization 11/20/16; patient's dimensions improving laterally by about half a centimeter in length. Otherwise no change on the medial side. Using [facility] 12/04/16; no major change in patient's wound dimensions. Intake nurse reports more drainage. 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 He is voicing no complaints or concerns. He has been tolerating his twice weekly compression therapy changes 12/11/16 Patient complains of pain and increased drainage.. wants hydrofera blue 12/18/16 improvement. Sorbact 12/25/16; medial wound is smaller, lateral measures the same. Still on sorbact 01/01/17; medial wound continues to be smaller, lateral measures about the same however there is clearly advancing epithelialization here as well in fact I think the wound will ultimately divided into 2 open areas 01/08/17; unfortunately today fairly significant regression in several areas. Surface of the lateral wound covered again in adherent necrotic material which is difficult to debridement. He has significant surrounding skin maceration. The expanding area of tissue epithelialization in the  middle of the wound that was encouraging last week appears to be smaller. There is no surrounding tenderness. The area on the medial leg also did not seem to be as healthy as last week, the reason for this regression this week is not totally clear. We have been using Sorbact for the last 4 weeks. We'll switched of polymen AG which we will order via home medical supply. If there is a problem with this would switch back to Iodoflex 01/15/18; drainage,odor. No change. Switched to polymen last week 01/22/17; still continuous drainage. Culture I did last week showed a few Proteus pansensitive. I did this culture because of drainage. Put him on Augmentin which she has been taking since Saturday however he is developed 4-5 liquid bowel movements. He is also on Coumadin. Beyond this wound is not changed at all, still nonviable necrotic surface material which I debrided reveals healthy granulation line 01/29/17; still copious amounts of drainage reported by her intake nurse. Wound measuring slightly smaller. Currently fact on Iodoflex although I'm looking forward to changing back to perhaps [facility] or polymen AG 02/05/17; still large amounts of drainage and presenting with really large amounts of adherent slough and necrotic material over the remaining open area of the wound. We have been using Iodoflex but with little improvement in the surface. Change to [facility] 02/12/17; still large amount of drainage. Much less adherent slough however. Started [employer]	[place] last week MAMOUDOU, MULVEHILL ([id number]) 02/19/17; drainage is better this week. Much less adherent slough. Perhaps some improvement in dimensions. Using [facility] 02/26/17; severe venous insufficiency wounds on the right lateral and right medial leg. Drainage is some better and slough is less adherent we've been using Hydrofera Blue 03/05/17 on evaluation today patient appears to be doing well. His wounds have been decreasing in size  and overall he is pleased with  how this is progressing. We are awaiting approval for the epigraph which has previously been recommended in the meantime the [facility] Dressing is to be doing very well for him. 03/12/17; wound dimensions are smaller still using Hydrofera Blue. Comes in on Fridays for a dressing change. 03/19/17; wound dimensions continued to contract. Healing of this wound is complicated by continuous significant drainage as well as recurrent buildup of necrotic surface material. We looked into Apligraf and he has a $290 co-pay per application but truthfully I think the drainage as well as the nonviable surface would preclude use of Apligraf there are any other skin substitute at this point therefore the continued plan will be debridement each clinic visit, 2 times a week dressing changes and continued use of Hydrofera Blue. improvement has been very slow but sustained 03/26/17 perhaps slight improvements in peripheral epithelialization is especially inferiorly. Still with large amount of drainage and tightly adherent necrotic surface on arrival. Along with the intake nurse I reviewed previous treatment. He worsened on Iodoflex and had a 4 week trial of sorbact, Polymen AG and a long courses of Aquacel Ag. He is not a candidate for advanced treatment options for many reasons 04/09/17 on evaluation today patient's right lower extremity wounds appear to be doing a little better. Fortunately he has no significant discomfort and has been tolerating the dressing changes including the wraps without complication. With that being said he really does not have swelling anymore compared to what he has had in the past following his vascular intervention. He wonders if potentially we could attempt avoiding the rats to see if he could cleanse the wound in between to try to prevent some of the fiber and its buildup that occurs in the interim between when we see him week to week. No fevers, chills,  nausea, or vomiting noted at this time. 04/16/17; noted that the staff made a choice last week not to put him in compression. The patient is changing his dressing at home using [facility] and changing every second day. His wife is washing the wound with saline. He is using kerlix. Surprisingly he has not developed a lot of edema. This choice was made because of the degree of fluid retention and maceration even with changing his dressing twice a week 04/23/17; absolutely no change. Using Hydrofera Blue. Recurrent tightly adherent nonviable surface material. This is been refractory to Iodoflex, sorbact and now Hydrofera Blue. More recently he has been changing his own dressings at home, cleansing the wound in the shower. He has not developed lower extremity edema 04/30/17; if anything the wound is larger still in adherent surface although it debrided easily today. I've been using Mehta honey for 1 week and I'd like to try and do it for a second week this see if we can get some form of viable surface here. 05/07/17; patient arrives with copious amounts of drainage and some pain in the superior part of the wound. He has not been systemically unwell. He's been changing this daily at home 05/14/17; patient arrives today complaining of less drainage and less pain. Dimensions slightly better. Surface culture I did of this last week grew MSSA I put him on dicloxacillin for 10 days. Patient requesting a further prescription since he feels so much better. He did not obtain the [facility] for reasons that aren't clear, he has been using Hydrofera Blue 05/21/17; perhaps some less drainage and less pain. He is completing another week of doxycycline. Unfortunately there is no change  in the wound measurements are appearance still tightly adherent necrotic surface material that is really defied treatment. We have been using Hydrofera Blue most recently. He did not manage to get Anasept. He was told it  was prescription at [facility] 05/29/17; absolutely no improvement in these wound areas. He had remote plastic surgery with who was done at [facility] in [place] surrounding this area. This was a traumatic wound with extensive plastic surgery/skin grafting at that time. I switched him to sorbact last week to see if he can do anything about the recurrent necrotic surface and drainage. This is largely been refractory to Iodoflex/Hydrofera Blue/alginates. I believe we tried Elby [employer] and more recently Medi honey l however the drainage is really too excessive 06/04/17; patient went to see Dr. Ulice Bold. Per the patient she ordered him a compression stocking. Continued the Anasept gel and sorbact was already on. No major improvement in the condition of the wounds in fact the medial wound looks larger. Again per the patient she did not feel a skin graft or operative debridement was indicated The patient had previous venous ablation in February 2018. Last arterial studies were in December 2017 and were within normal limits 06/18/17; patient arrives back in clinic today using Anasept gel with sorbact. He changes this every day is wearing his own compression stocking. The patient actually saw Dr. Leta Baptist of plastic surgery. I've reviewed her note. The appointment was on 05/30/17. The basic issue with here was that she did not feel that skin grafts for patients with underlying stasis and edema have a good long-term success rate. She also discuss skin substitutes which has been done here as well however I have not been able to get the surface of these wounds to something that I think would support skin substitutes. This is why he felt he might need an KWAME, RYLAND. ([id number]) operative debridement more aggressively than I can do in this clinic Review of systems; is otherwise negative he feels well 07/02/17; patient has been using Anasept gel with Sorbact. He changes this every day scrubs out the  wound beds. Arrives today with the wound bed looking some better. Easier debridement 07/16/17; patient has been using Anasept gel was sorbact for about a month now. The necrotic service on his wound bed is better and the drainage is now down to minimal but we've not made any improvements in the epithelialization. Change to Santyl today. Surface of the wound is still not good enough to support skin substitutes 07/30/17 on evaluation today patient appears to be doing very well in regard to his right bimalleolus wounds. He has been tolerating the treatment with the Anasept gel and sorbact. However we were going to try and switch to Santyl to be more aggressive with these wounds. This was however cost prohibitive. Therefore we will likely go back to the sorbact. Nonetheless he is not having any significant discomfort he still is forming slough over the wound location but nothing too significant. The wounds do appear to be filling in nicely. 08/13/17; I have not seen this wound in about a month. There is not much change. The fact the area medially looks larger. He has been using sorbact and Anasept for over a month now without much effect. Arrives in clinic stating that he does not want the area debrided 08/28/17; patient arrives with the areas on his right medial and right lateral ankle worse. The wounds of expanded there is erythema and still drainage. There is no pain and no tenderness.  We had been using Keracel AG clearly not doing well. 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 He states that even the evening that we began utilizing the Iodoflex he started having burning. Subsequently this never really improved and he eventually  discontinue the use of the Iodoflex altogether. Unfortunately he did not let us know about any of this until today. I'm unsure of any specific infection at this point although I am going to obtain a wound culture today in order to see if there's anything that could potentially be causing an infection as well. It does sound as if he's had the Iodoflex before and did not have this kind of reaction although this does sound very specific for a reaction to the Iodoflex. 09/17/17 on evaluation today patient appears to be doing significantly better compared to last week's evaluation. At that time it actually appears that he did have an infection the good news is that we have been able to get this under control with switching to the [facility] solution he's not having as much pain and discomfort he still does have some erythema surrounding the medial aspect wound. He has been tolerating the Dakin's soaked gauze dressing which is excellent and that his wounds do not seem to have as much adherent slough. Overall I'm pleased with the progress she's made in last week. 10/08/17 on evaluation today patient appears to be doing well in regard to his right medial and lateral malleolus ulcers. With that being said he has started to develop some green drainage at this point again. He tells me that this showed up just shortly after he quit taking his antibiotics. I do feel like that he has an underlying infection that even 19 day course of antibiotic therapy does not seem to have fully resolved. Today we discussed potentially doing a 30 day course of antibiotics to try to see if we can completely knock this out. He is in agreement with the plan. 10/15/17 on evaluation today Mr. Hyams presents for follow-up concerning his right medial and lateral malleolus ulcers. He does seem to be doing better since I placed him on the antibiotics last week. He was starting to develop infection again I do believe that has seem to resolve  at this point. Fortunately he has no evidence of infection which is great news. Overall he did not have any significant discomfort he does have some swelling but he's been doing more work cleaning out his mother's house as well as car and otherwise due to her having somewhat recently passed. 10/22/17 on evaluation today patient appears to show evidence of maceration around the wound. Up to this point he really has not in the Dakin's has been of benefit for him. I'm not sure what changed over the past week however. He still has slough covering the wound bed but not nearly as significant as what's been noted previously. Overall I have been pleased with how things have progressed up until today. Patient does not seem to have any significant discomfort which is good news. Patient History Information obtained from Patient. Social History Former smoker - 10 years ago, Marital Status - Married, Alcohol Use - Moderate, Drug Use - No History, Caffeine Use - Never. Flury,  RESHAD SAAB ([id number]) Medical And Surgical History Notes Constitutional Symptoms (General Health) Vein Filter (groin area); CODA; H/O Blood Clots; pulmonary hypertensive arterial disease Review of Systems (ROS) Constitutional Symptoms (General Health) Denies complaints or symptoms of Fever, Chills. Respiratory The patient has no complaints or symptoms. Cardiovascular Complains or has symptoms of LE edema. Psychiatric The patient has no complaints or symptoms. Objective Constitutional Well-nourished and well-hydrated in no acute distress. Vitals Time Taken: 8:56 AM, Height: 76 in, Weight: 238 lbs, BMI: 29, Temperature: 98.4 F, Pulse: 84 bpm, Respiratory Rate: 18 breaths/min, Blood Pressure: 129/45 mmHg. Respiratory normal breathing without difficulty. Psychiatric this patient is able to make decisions and demonstrates good insight into disease process. Alert and Oriented x 3. pleasant and cooperative. General Notes:  Patient's wound does show evidence of periwound maceration which is new this week compared to prior weeks. Fortunately there does not appear to be any evidence of significant infection at this point. There is slough covering the wound and this did require sharp debridement today which I performed successfully without complication and post debridement the wound appear to be doing much better. Integumentary (Hair, Skin) Wound #1 status is Open. Original cause of wound was Gradually Appeared. The wound is located on the Right,Lateral Malleolus. The wound measures 6.3cm length x 6.5cm width x 0.2cm depth; 32.162cm^2 area and 6.432cm^3 volume. There is Fat Layer (Subcutaneous Tissue) Exposed exposed. There is no tunneling or undermining noted. There is a medium amount of serous drainage noted. The wound margin is distinct with the outline attached to the wound base. There is medium (34-66%) red granulation within the wound bed. There is a medium (34-66%) amount of necrotic tissue within the wound bed including Adherent Slough. The periwound skin appearance exhibited: Excoriation, Scarring, Maceration, Hemosiderin Staining, Erythema. The periwound skin appearance did not exhibit: Callus, Crepitus, Induration, Rash, Dry/Scaly, Atrophie Blanche, Cyanosis, Ecchymosis, Mottled, Pallor, Rubor. The surrounding wound skin color is noted with erythema which is circumferential. Periwound temperature was noted as No Abnormality. The periwound has tenderness on palpation. Wound #2 status is Open. Original cause of wound was Gradually Appeared. The wound is located on the Right,Medial Malleolus. The wound measures 4.4cm length x 3.8cm width x 0.1cm depth; 13.132cm^2 area and 1.313cm^3 volume. There is Fat Layer (Subcutaneous Tissue) Exposed exposed. There is no tunneling or undermining noted. There is a medium amount of serous drainage noted. The wound margin is distinct with the outline attached to the wound base.  There is medium Dutt, Brenin C. ([id number]) (34-66%) red granulation within the wound bed. There is a medium (34-66%) amount of necrotic tissue within the wound bed including Adherent Slough. The periwound skin appearance exhibited: Excoriation, Scarring, Maceration, Hemosiderin Staining, Erythema. The periwound skin appearance did not exhibit: Callus, Crepitus, Induration, Rash, Dry/Scaly, Atrophie Blanche, Cyanosis, Ecchymosis, Mottled, Pallor, Rubor. The surrounding wound skin color is noted with erythema which is circumferential. Periwound temperature was noted as No Abnormality. The periwound has tenderness on palpation. 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 There was a Skin/Subcutaneous Tissue Debridement ([id number]) debridement with total area of 40.95 sq cm performed by STONE III, HOYT E., PA-C. with the following instrument(s): Curette to remove Viable and Non-Viable tissue/material including Exudate, Fibrin/Slough, and Subcutaneous after achieving pain control using Lidocaine 4% Topical Solution. A time out was conducted at 09:33, prior to the start of the procedure. A Minimum amount of bleeding was controlled with Pressure. The procedure was tolerated well with a pain  level of 0 throughout and a pain level of 0 following the procedure. Post Debridement Measurements: 6.3cm length x 6.5cm width x 0.3cm depth; 9.649cm^3 volume. Character of Wound/Ulcer Post Debridement requires further debridement. Severity of Tissue Post Debridement is: Fat layer exposed. 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 There was a Skin/Subcutaneous Tissue Debridement ([id number]) debridement with total area of 16.72 sq cm performed by STONE III, HOYT E., PA-C. with the following instrument(s): Curette to remove Viable and Non-Viable tissue/material including Exudate, Fibrin/Slough, and Subcutaneous after achieving pain control using Lidocaine 4% Topical Solution. A time out was conducted at 09:33, prior to the start of the procedure. A Minimum amount of bleeding was controlled with Pressure. The procedure was tolerated well with a pain level of 0 throughout and a pain level of 0 following the procedure. Post Debridement Measurements: 4.4cm length x 3.8cm width x 0.2cm depth; 2.626cm^3 volume. Character of Wound/Ulcer Post Debridement requires further debridement. Severity of Tissue Post Debridement is: Fat layer exposed. Post procedure Diagnosis Wound #2: Same as Pre-Procedure Kovich, Anfernee C. ([id number]) Plan Wound Cleansing: Wound #1 Right,Lateral Malleolus: Clean wound with Normal Saline. Cleanse wound with mild soap and water May Shower, gently pat wound dry prior to applying new dressing. Wound #2 Right,Medial Malleolus: Clean wound with Normal Saline. Cleanse wound with mild soap and water May Shower, gently pat wound dry prior to applying new dressing. Anesthetic (add to Medication List): Wound #1 Right,Lateral Malleolus: Topical Lidocaine 4% cream applied to wound bed prior to  debridement (In Clinic Only). Wound #2 Right,Medial Malleolus: Topical Lidocaine 4% cream applied to wound bed prior to debridement (In Clinic Only). Skin Barriers/Peri-Wound Care: Wound #1 Right,Lateral Malleolus: Barrier cream Wound #2 Right,Medial Malleolus: Barrier cream Primary Wound Dressing: Wound #1 Right,Lateral Malleolus: Hydrogel Prisma Ag Wound #2 Right,Medial Malleolus: Hydrogel Prisma Ag Secondary Dressing: Wound #1 Right,Lateral Malleolus: ABD pad Dry Gauze Conform/Kerlix XtraSorb Wound #2 Right,Medial Malleolus: ABD pad Dry Gauze Conform/Kerlix XtraSorb Dressing Change Frequency: Wound #1 Right,Lateral Malleolus: Change dressing every day. Wound #2 Right,Medial Malleolus: Change dressing every day. Follow-up Appointments: Wound #1 Right,Lateral Malleolus: Return Appointment in 1 week. Wound #2 Right,Medial Malleolus: Return Appointment in 1 week. Edema Control: Wound #1 Right,Lateral Malleolus: Elevate legs to the level of the heart and pump ankles as often as possible Wound #2 Right,Medial Malleolus: Elevate legs to the level of the heart and pump ankles as often as possible Additional Orders / Instructions: Wound #1 Right,Lateral Malleolus: Increase protein intake. Wound #2 Right,Medial Malleolus: Glas, Klyde C. ([id number]) Increase protein intake. Medications-please add to medication list.: Wound #1 Right,Lateral Malleolus: Other: - Vitamin C, Zinc, MVI Wound #2 Right,Medial Malleolus: Other: - Vitamin C, Zinc, MVI The following medication(s) was prescribed: lidocaine topical 4 % cream 1 1 cream topical was prescribed  at facility I'm going to suggest that we switch to [facility] as far as patient's dressing is concerned to see if this can be of benefit for him. I'm concerned about the fact that he seems to be very macerated with the Current wound care measures utilizing the [place] s. Patient is in agreement with giving this a try. We will see  him for reevaluation in one weeks time to see were things stand. Please see above for specific wound care orders. We will see patient for re-evaluation in 1 week(s) here in the clinic. If anything worsens or changes patient will contact our office for additional recommendations. Electronic Signature(s) Signed: 10/22/2017 5:35:20 PM By: Lenda Kelp PA-C Entered By: Lenda Kelp on 10/22/2017 09:57:14 [name] Stack ([id number]) -------------------------------------------------------------------------------- ROS/PFSH Details Patient Name: [name] Stack Date of Service: 10/22/2017 9:00 AM Medical Record Number: [id number] Patient Account Number: [id number] Date of Birth/Sex: [birth year] (72 y.o. Male) Treating RN: Ashok Cordia, [name] Primary Care Provider: [name] Other Clinician: Referring Provider: [name] Treating Provider/Extender: STONE III, HOYT Weeks in Treatment: 71 Information Obtained From Patient Wound History Do you currently have one or more open woundso Yes How many open wounds do you currently haveo 1 Approximately how long have you had your woundso 5 months How have you been treating your wound(s) until nowo saline, dressing Has your wound(s) ever healed and then re-openedo Yes Have you had any lab work done in the past montho No Have you tested positive for an antibiotic resistant organism (MRSA, VRE)o No Have you tested positive for osteomyelitis (bone infection)o No Have you had any tests for circulation on your legso No Constitutional Symptoms (General Health) Complaints and Symptoms: Negative for: Fever; Chills Medical History: Past Medical History Notes: Vein Filter (groin area); CODA; H/O Blood Clots; pulmonary hypertensive arterial disease Cardiovascular Complaints and Symptoms: Positive for: LE edema Medical History: Negative for: Angina; Arrhythmia; Congestive Heart Failure; Coronary Artery Disease; Deep Vein  Thrombosis; Hypertension; Hypotension; Myocardial Infarction; Peripheral Arterial Disease; Peripheral Venous Disease; Phlebitis; Vasculitis Eyes Medical History: Positive for: Cataracts - removed Negative for: Glaucoma; Optic Neuritis Ear/Nose/Mouth/Throat Medical History: Negative for: Chronic sinus problems/congestion Hematologic/Lymphatic Medical History: Negative for: Anemia; Hemophilia; Human Immunodeficiency Virus; Lymphedema; Sickle Cell Disease Respiratory Barto, Lucious C. ([id number]) Complaints and Symptoms: No Complaints or Symptoms Medical History: Positive for: Chronic Obstructive Pulmonary Disease (COPD) Negative for: Aspiration; Asthma; Pneumothorax; Sleep Apnea; Tuberculosis Gastrointestinal Medical History: Negative for: Cirrhosis ; Colitis; Crohnos; Hepatitis A; Hepatitis B; Hepatitis C Endocrine Medical History: Negative for: Type I Diabetes; Type II Diabetes Genitourinary Medical History: Negative for: End Stage Renal Disease Immunological Medical History: Negative for: Lupus Erythematosus; Raynaudos Integumentary (Skin) Medical History: Negative for: History of Burn; History of pressure wounds Musculoskeletal Medical History: Positive for: Osteoarthritis Negative for: Gout; Rheumatoid Arthritis; Osteomyelitis Neurologic Medical History: Negative for: Dementia; Neuropathy; Quadriplegia; Paraplegia; Seizure Disorder Oncologic Medical History: Negative for: Received Chemotherapy; Received Radiation Psychiatric Complaints and Symptoms: No Complaints or Symptoms Medical History: Negative for: Anorexia/bulimia; Confinement Anxiety HBO Extended History Items Eyes: Cataracts Schildt, Jomari C. ([id number]) Immunizations Pneumococcal Vaccine: Received Pneumococcal Vaccination: No Implantable Devices Family and Social History Former smoker - 10 years ago; Marital Status - Married; Alcohol Use: Moderate; Drug Use: No History; Caffeine Use:  Never; Advanced Directives: No; Patient does not want information on Advanced Directives; Living Will: No; Medical Power of Attorney: No Physician Affirmation I have reviewed and agree with the above information. Electronic Signature(s) Signed: 10/22/2017 5:35:20 PM By: Lenda Kelp  PA-C Signed: 10/23/2017 4:30:35 PM By: Alejandro Mulling Entered By: Lenda Kelp on 10/22/2017 09:55:58 [name] Stack ([id number]) -------------------------------------------------------------------------------- SuperBill Details Patient Name: [name] Stack Date of Service: 10/22/2017 Medical Record Number: [id number] Patient Account Number: [id number] Date of Birth/Sex: [birth year] (72 y.o. 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, initial encounter T81.31XD Disruption of external operation (surgical) wound, not elsewhere classified, subsequent encounter L97.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 of skin Facility Procedures CPT4 Code: 57846962 Description: 11042 - DEB SUBQ TISSUE 20 SQ CM/< ICD-10 Diagnosis Description L97.212 Non-pressure chronic ulcer of right calf with fat layer expos Modifier: ed Quantity: 1 CPT4 Code: 95284132 Description: 11045 - DEB SUBQ TISS EA ADDL 20CM ICD-10 Diagnosis Description L97.212 Non-pressure chronic ulcer of right calf with fat layer expos Modifier: ed Quantity: 2 Physician Procedures CPT4 Code:  4401027 Description: 11042 - WC PHYS SUBQ TISS 20 SQ CM ICD-10 Diagnosis Description L97.212 Non-pressure chronic ulcer of right calf with fat layer expose Modifier: d Quantity: 1 CPT4 Code: 2536644 Description: 11045 - WC PHYS SUBQ TISS EA ADDL 20 CM ICD-10 Diagnosis Description L97.212 Non-pressure chronic ulcer of right calf with fat layer expose Modifier: d Quantity: 2 Electronic Signature(s) Signed: 10/22/2017 5:35:20 PM By: Lenda Kelp PA-C Entered By: Lenda Kelp on 10/22/2017 09:57:54

## 2017-10-27 NOTE — Progress Notes (Signed)
[name] ([id number]) Visit Report for 10/22/2017 Arrival Information Details Patient Name: [name], [name]. Date of Service: 10/22/2017 9:00 AM Medical Record Number: [id number] Patient Account Number: [id number] Date of Birth/Sex: 10-08-1945 (72 y.o. Male) Treating RN: Curtis Sites Primary Care Amer Alcindor: [name] Other Clinician: Referring Stefano Trulson: [name] Treating Roch Quach/Extender: Linwood Dibbles, HOYT Weeks in Treatment: 92 Visit Information History Since Last Visit Added or deleted any medications: No Patient Arrived: Ambulatory Any new allergies or adverse reactions: No Arrival Time: 08:55 Had a fall or experienced change in No Accompanied By: self activities of daily living that may affect Transfer Assistance: None risk of falls: Patient Identification Verified: Yes Signs or symptoms of abuse/neglect since last visito No Secondary Verification Process Yes Hospitalized since last visit: No Completed: Has Dressing in Place as Prescribed: Yes Patient Requires Transmission-Based No Has Compression in Place as Prescribed: Yes Precautions: Pain Present Now: No Patient Has Alerts: Yes Patient Alerts: Patient on Blood Thinner Electronic Signature(s) Signed: 10/22/2017 4:46:41 PM By: Curtis Sites Entered By: Curtis Sites on 10/22/2017 08:55:25 [name] ([id number]) -------------------------------------------------------------------------------- Encounter Discharge Information Details Patient Name: [name] Date of Service: 10/22/2017 9:00 AM Medical Record Number: [id number] Patient Account Number: [id number] Date of Birth/Sex: May 17, 1946 (72 y.o. Male) Treating RN: [name] Primary Care Jaylianna Tatlock: [name] Other Clinician: Referring Amaia Lavallie: [name] Treating Maille Halliwell/Extender: STONE III, HOYT Weeks in Treatment: 63 Encounter Discharge Information Items Discharge Pain Level: 0 Discharge Condition:  Stable Ambulatory Status: Ambulatory Discharge Destination: Home Transportation: Private Auto Accompanied By: self Schedule Follow-up Appointment: Yes Medication Reconciliation completed and No provided to Patient/Care Avalina Benko: Patient Clinical Summary of Care: Declined Electronic Signature(s) Signed: 10/25/2017 5:56:09 PM By: Renne Crigler Entered By: Renne Crigler on 10/22/2017 09:53:38 Stene, [name] ([id number]) -------------------------------------------------------------------------------- Lower Extremity Assessment Details Patient Name: [name] Date of Service: 10/22/2017 9:00 AM Medical Record Number: [id number] Patient Account Number: [id number] Date of Birth/Sex: October 02, 1945 (72 y.o. Male) Treating RN: Curtis Sites Primary Care Tonilynn Bieker: [name] Other Clinician: Referring Rina Adney: [name] Treating Kamoni Gentles/Extender: STONE III, HOYT Weeks in Treatment: 92 Vascular Assessment Pulses: Dorsalis Pedis Palpable: [Right:Yes] Posterior Tibial Extremity colors, hair growth, and conditions: Extremity Color: [Right:Hyperpigmented] Hair Growth on Extremity: [Right:No] Temperature of Extremity: [Right:Warm] Capillary Refill: [Right:< 3 seconds] Electronic Signature(s) Signed: 10/22/2017 4:46:41 PM By: Curtis Sites Entered By: Curtis Sites on 10/22/2017 09:05:08 [name] ([id number]) -------------------------------------------------------------------------------- Multi Wound Chart Details Patient Name: [name] Date of Service: 10/22/2017 9:00 AM Medical Record Number: [id number] Patient Account Number: [id number] Date of Birth/Sex: 07-22-1946 (72 y.o. Male) Treating RN: [name] Primary Care Clyda Smyth: [name] Other Clinician: Referring Quantavia Frith: [name] Treating Quinlin Conant/Extender: STONE III, HOYT Weeks in Treatment: 92 Vital Signs Height(in): 76 Pulse(bpm): 84 Weight(lbs): 238 Blood  Pressure(mmHg): 129/45 Body Mass Index(BMI): 29 Temperature(F): 98.4 Respiratory Rate 18 (breaths/min): Photos: [N/A:N/A] Wound Location: Right Malleolus - Lateral Right Malleolus - Medial N/A Wounding Event: Gradually Appeared Gradually Appeared N/A Primary Etiology: Venous Leg Ulcer Venous Leg Ulcer N/A Comorbid History: Cataracts, Chronic Obstructive Cataracts, Chronic Obstructive N/A Pulmonary Disease (COPD), Pulmonary Disease (COPD), Osteoarthritis Osteoarthritis Date Acquired: 08/11/2015 01/30/2016 N/A Weeks of Treatment: 92 90 N/A Wound Status: Open Open N/A Clustered Wound: No Yes N/A Measurements L x W x D 6.3x6.5x0.2 4.4x3.8x0.1 N/A (cm) Area (cm) : 32.162 13.132 N/A Volume (cm) : 6.432 1.313 N/A % Reduction in Area: -86.10% -13.70% N/A % Reduction in Volume: -24.10% -13.70% N/A Classification: Full Thickness Without Full Thickness Without N/A Exposed  Support Structures Exposed Support Structures Exudate Amount: Large Large N/A Exudate Type: Serous Serous N/A Exudate Color: amber amber N/A Wound Margin: Distinct, outline attached Distinct, outline attached N/A Granulation Amount: Medium (34-66%) Medium (34-66%) N/A Granulation Quality: Red Red N/A Necrotic Amount: Medium (34-66%) Medium (34-66%) N/A Exposed Structures: Fat Layer (Subcutaneous Fat Layer (Subcutaneous N/A Tissue) Exposed: Yes Tissue) Exposed: Yes Fascia: No Fascia: No Tendon: No Tendon: No Stroschein, Finch C. ([id number]) Muscle: No Muscle: No Joint: No Joint: No Bone: No Bone: No Epithelialization: None None N/A Periwound Skin Texture: Excoriation: Yes Excoriation: Yes N/A Scarring: Yes Scarring: Yes Induration: No Induration: No Callus: No Callus: No Crepitus: No Crepitus: No Rash: No Rash: No Periwound Skin Moisture: Maceration: Yes Maceration: Yes N/A Dry/Scaly: No Dry/Scaly: No Periwound Skin Color: Erythema: Yes Erythema: Yes N/A Hemosiderin Staining:  Yes Hemosiderin Staining: Yes Atrophie Blanche: No Atrophie Blanche: No Cyanosis: No Cyanosis: No Ecchymosis: No Ecchymosis: No Mottled: No Mottled: No Pallor: No Pallor: No Rubor: No Rubor: No Erythema Location: Circumferential Circumferential N/A Temperature: No Abnormality No Abnormality N/A Tenderness on Palpation: Yes Yes N/A Wound Preparation: Ulcer Cleansing: Ulcer Cleansing: N/A Rinsed/Irrigated with Saline Rinsed/Irrigated with Saline Topical Anesthetic Applied: Topical Anesthetic Applied: Other: lidocaine 4% Other: lidocaine 4% Treatment Notes Electronic Signature(s) Signed: 10/23/2017 4:30:35 PM By: Alejandro Mulling Entered By: Alejandro Mulling on 10/22/2017 09:31:29 [name] ([id number]) -------------------------------------------------------------------------------- Multi-Disciplinary Care Plan Details Patient Name: [name] Date of Service: 10/22/2017 9:00 AM Medical Record Number: [id number] Patient Account Number: [id number] Date of Birth/Sex: [birth year] (72 y.o. Male) Treating RN: [name] Primary Care Caryl Fate: [name] Other Clinician: Referring Kendell Gammon: [name] Treating Kento Gossman/Extender: STONE III, HOYT Weeks in Treatment: 92 Active Inactive ` Venous Leg Ulcer Nursing Diagnoses: Knowledge deficit related to disease process and management Goals: Patient will maintain optimal edema control Date Initiated: 04/03/2016 Target Resolution Date: 11/24/2016 Goal Status: Active Interventions: Compression as ordered Treatment Activities: Therapeutic compression applied : 04/03/2016 Notes: ` Wound/Skin Impairment Nursing Diagnoses: Impaired tissue integrity Goals: Ulcer/skin breakdown will heal within 14 weeks Date Initiated: 01/17/2016 Target Resolution Date: 11/24/2016 Goal Status: Active Interventions: Assess ulceration(s) every visit Notes: Electronic Signature(s) Signed: 10/23/2017 4:30:35 PM By: Alejandro Mulling Entered By: Alejandro Mulling on 10/22/2017 09:31:18 [name] ([id number]) -------------------------------------------------------------------------------- Pain Assessment Details Patient Name: [name] Date of Service: 10/22/2017 9:00 AM Medical Record Number: [id number] Patient Account Number: [id number] Date of Birth/Sex: 02-09-1946 (72 y.o. Male) Treating RN: Curtis Sites Primary Care Shalice Woodring: [name] Other Clinician: Referring Gustavo Dispenza: [name] Treating Dougles Kimmey/Extender: STONE III, HOYT Weeks in Treatment: 92 Active Problems Location of Pain Severity and Description of Pain Patient Has Paino Yes Site Locations Pain Location: Pain in Ulcers With Dressing Change: Yes Duration of the Pain. Constant / Intermittento Constant Pain Management and Medication Current Pain Management: Electronic Signature(s) Signed: 10/22/2017 4:46:41 PM By: Curtis Sites Entered By: Curtis Sites on 10/22/2017 08:55:45 [name] ([id number]) -------------------------------------------------------------------------------- Patient/Caregiver Education Details Patient Name: [name] Date of Service: 10/22/2017 9:00 AM Medical Record Number: [id number] Patient Account Number: [id number] Date of Birth/Gender: 18-May-1946 (72 y.o. Male) Treating RN: Renne Crigler Primary Care Physician: [name] Other Clinician: Referring Physician: [name] Treating Physician/Extender: Lenda Kelp Weeks in Treatment: 55 Education Assessment Education Provided To: Patient Education Topics Provided Wound Debridement: Handouts: Wound Debridement Methods: Explain/Verbal Responses: State content correctly Wound/Skin Impairment: Handouts: Caring for Your Ulcer Methods: Explain/Verbal Responses: State content correctly Electronic Signature(s) Signed: 10/25/2017 5:56:09 PM By: Renne Crigler Entered  By: Renne Crigler on 10/22/2017  09:53:55 [name] ([id number]) -------------------------------------------------------------------------------- Wound Assessment Details Patient Name: NILES, ESS. Date of Service: 10/22/2017 9:00 AM Medical Record Number: [id number] Patient Account Number: [id number] Date of Birth/Sex: [birth year] (72 y.o. Male) Treating RN: Curtis Sites Primary Care Lindsi Bayliss: [name] Other Clinician: Referring Joplin Canty: [name] Treating Aladdin Kollmann/Extender: STONE III, HOYT Weeks in Treatment: 92 Wound Status Wound Number: 1 Primary Venous Leg Ulcer Etiology: Wound Location: Right Malleolus - Lateral Wound Open Wounding Event: Gradually Appeared Status: Date Acquired: 08/11/2015 Comorbid Cataracts, Chronic Obstructive Pulmonary Weeks Of Treatment: 92 History: Disease (COPD), Osteoarthritis Clustered Wound: No Photos Wound Measurements Length: (cm) 6.3 Width: (cm) 6.5 Depth: (cm) 0.2 Area: (cm) 32.162 Volume: (cm) 6.432 % Reduction in Area: -86.1% % Reduction in Volume: -24.1% Epithelialization: None Tunneling: No Undermining: No Wound Description Full Thickness Without Exposed Support Foul Odo Classification: Structures Slough/F Wound Margin: Distinct, outline attached Exudate Medium Amount: Exudate Type: Serous Exudate Color: amber r After Cleansing: No ibrino Yes Wound Bed Granulation Amount: Medium (34-66%) Exposed Structure Granulation Quality: Red Fascia Exposed: No Necrotic Amount: Medium (34-66%) Fat Layer (Subcutaneous Tissue) Exposed: Yes Necrotic Quality: Adherent Slough Tendon Exposed: No Muscle Exposed: No Joint Exposed: No Bone Exposed: No Periwound Skin Texture Gioffre, Aylan C. ([id number]) Texture Color No Abnormalities Noted: No No Abnormalities Noted: No Callus: No Atrophie Blanche: No Crepitus: No Cyanosis: No Excoriation: Yes Ecchymosis: No Induration: No Erythema: Yes Rash: No Erythema Location:  Circumferential Scarring: Yes Hemosiderin Staining: Yes Mottled: No Moisture Pallor: No No Abnormalities Noted: No Rubor: No Dry / Scaly: No Maceration: Yes Temperature / Pain Temperature: No Abnormality Tenderness on Palpation: Yes Wound Preparation Ulcer Cleansing: Rinsed/Irrigated with Saline Topical Anesthetic Applied: Other: lidocaine 4%, Treatment Notes Wound #1 (Right, Lateral Malleolus) 1. Cleansed with: Clean wound with Normal Saline 2. Anesthetic Topical Lidocaine 4% cream to wound bed prior to debridement 3. Peri-wound Care: Barrier cream 4. Dressing Applied: Hydrogel Prisma Ag 5. Secondary Dressing Applied ABD Pad Dry Gauze Notes xtrasorb wrap with kerlix wrap and tape Electronic Signature(s) Signed: 10/22/2017 4:46:41 PM By: Curtis Sites Signed: 10/23/2017 4:30:35 PM By: Alejandro Mulling Entered By: Alejandro Mulling on 10/22/2017 09:43:20 [name] ([id number]) -------------------------------------------------------------------------------- Wound Assessment Details Patient Name: [name] Date of Service: 10/22/2017 9:00 AM Medical Record Number: [id number] Patient Account Number: [id number] Date of Birth/Sex: [birth year] (72 y.o. Male) Treating RN: Curtis Sites Primary Care Baxter Gonzalez: [name] Other Clinician: Referring Brittiny Levitz: [name] Treating Tj Kitchings/Extender: STONE III, HOYT Weeks in Treatment: 92 Wound Status Wound Number: 2 Primary Venous Leg Ulcer Etiology: Wound Location: Right Malleolus - Medial Wound Open Wounding Event: Gradually Appeared Status: Date Acquired: 01/30/2016 Comorbid Cataracts, Chronic Obstructive Pulmonary Weeks Of Treatment: 90 History: Disease (COPD), Osteoarthritis Clustered Wound: Yes Photos Wound Measurements Length: (cm) 4.4 Width: (cm) 3.8 Depth: (cm) 0.1 Area: (cm) 13.132 Volume: (cm) 1.313 % Reduction in Area: -13.7% % Reduction in Volume: -13.7% Epithelialization:  None Tunneling: No Undermining: No Wound Description Full Thickness Without Exposed Support Foul Odo Classification: Structures Slough/F Wound Margin: Distinct, outline attached Exudate Medium Amount: Exudate Type: Serous Exudate Color: amber r After Cleansing: No ibrino Yes Wound Bed Granulation Amount: Medium (34-66%) Exposed Structure Granulation Quality: Red Fascia Exposed: No Necrotic Amount: Medium (34-66%) Fat Layer (Subcutaneous Tissue) Exposed: Yes Necrotic Quality: Adherent Slough Tendon Exposed: No Muscle Exposed: No Joint Exposed: No Bone Exposed: No Periwound Skin Texture Theilen, Furious C. ([id number]) Texture Color No Abnormalities Noted: No No Abnormalities Noted: No Callus:  No Atrophie Blanche: No Crepitus: No Cyanosis: No Excoriation: Yes Ecchymosis: No Induration: No Erythema: Yes Rash: No Erythema Location: Circumferential Scarring: Yes Hemosiderin Staining: Yes Mottled: No Moisture Pallor: No No Abnormalities Noted: No Rubor: No Dry / Scaly: No Maceration: Yes Temperature / Pain Temperature: No Abnormality Tenderness on Palpation: Yes Wound Preparation Ulcer Cleansing: Rinsed/Irrigated with Saline Topical Anesthetic Applied: Other: lidocaine 4%, Treatment Notes Wound #2 (Right, Medial Malleolus) 1. Cleansed with: Clean wound with Normal Saline 2. Anesthetic Topical Lidocaine 4% cream to wound bed prior to debridement 3. Peri-wound Care: Barrier cream 4. Dressing Applied: Hydrogel Prisma Ag 5. Secondary Dressing Applied ABD Pad Dry Gauze Notes xtrasorb wrap with kerlix wrap and tape Electronic Signature(s) Signed: 10/22/2017 4:46:41 PM By: Curtis Sitesorthy, Joanna Signed: 10/23/2017 4:30:35 PM By: Alejandro MullingPinkerton, Debra Entered By: Alejandro MullingPinkerton, Debra on 10/22/2017 09:44:00 [name] StackBLACKWELL, Donaven C. ([id number]) -------------------------------------------------------------------------------- Vitals Details Patient Name: [name] StackBLACKWELL, Husayn  C. Date of Service: 10/22/2017 9:00 AM Medical Record Number: [id number] Patient Account Number: [id number] Date of Birth/Sex: 1945/12/10 63(71 y.o. Male) Treating RN: Curtis Sitesorthy, Joanna Primary Care Jerardo Costabile: Darreld McleanMILES, LINDA Other Clinician: Referring Yiselle Babich: Darreld McleanMILES, LINDA Treating Arvetta Araque/Extender: STONE III, HOYT Weeks in Treatment: 92 Vital Signs Time Taken: 08:56 Temperature (F): 98.4 Height (in): 76 Pulse (bpm): 84 Weight (lbs): 238 Respiratory Rate (breaths/min): 18 Body Mass Index (BMI): 29 Blood Pressure (mmHg): 129/45 Reference Range: 80 - 120 mg / dl Electronic Signature(s) Signed: 10/22/2017 4:46:41 PM By: Curtis Sitesorthy, Joanna Entered By: Curtis Sitesorthy, Joanna on 10/22/2017 08:58:30

## 2017-10-29 ENCOUNTER — Encounter: Payer: Medicare HMO | Attending: Physician Assistant | Admitting: Physician Assistant

## 2017-10-29 DIAGNOSIS — I87331 Chronic venous hypertension (idiopathic) with ulcer and inflammation of right lower extremity: Secondary | ICD-10-CM | POA: Insufficient documentation

## 2017-10-29 DIAGNOSIS — L97312 Non-pressure chronic ulcer of right ankle with fat layer exposed: Secondary | ICD-10-CM | POA: Insufficient documentation

## 2017-10-29 DIAGNOSIS — Z87891 Personal history of nicotine dependence: Secondary | ICD-10-CM | POA: Insufficient documentation

## 2017-10-29 DIAGNOSIS — Y839 Surgical procedure, unspecified as the cause of abnormal reaction of the patient, or of later complication, without mention of misadventure at the time of the procedure: Secondary | ICD-10-CM | POA: Insufficient documentation

## 2017-10-29 DIAGNOSIS — Y832 Surgical operation with anastomosis, bypass or graft as the cause of abnormal reaction of the patient, or of later complication, without mention of misadventure at the time of the procedure: Secondary | ICD-10-CM | POA: Diagnosis not present

## 2017-10-29 DIAGNOSIS — Z86718 Personal history of other venous thrombosis and embolism: Secondary | ICD-10-CM | POA: Diagnosis not present

## 2017-10-29 DIAGNOSIS — T8131XA Disruption of external operation (surgical) wound, not elsewhere classified, initial encounter: Secondary | ICD-10-CM | POA: Diagnosis not present

## 2017-10-29 DIAGNOSIS — T85613A Breakdown (mechanical) of artificial skin graft and decellularized allodermis, initial encounter: Secondary | ICD-10-CM | POA: Diagnosis not present

## 2017-10-29 DIAGNOSIS — J449 Chronic obstructive pulmonary disease, unspecified: Secondary | ICD-10-CM | POA: Insufficient documentation

## 2017-10-31 NOTE — Progress Notes (Signed)
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 of Cefdinir or which I think was done  empirically. His arterial study showed ABIs were 1.1 on the right 1.5 on the left. He did have a slightly reduced ABI in the right the left one was not obtained. Had calcification of the right posterior tibial artery. The interpretation was no segmental stenosis. His waveforms were triphasic. TYLON, KEMMERLING ([id number]) His reflux studies are later this month. Depending on this I'll send him for a vascular consultation, he  may need to see plastic surgery as I believe he is had plastic surgery on this foot in the past. He had an injury to the foot in the 1980s. 1/16 /18 right lateral greater than right medial ankle wounds on the right in the setting of previous skin grafting. 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 He does not have arterial issues. Each week he comes in with the same adherent surface slough although there was less of this today 09/18/16; right lateral greater than right medial lower extremity wounds in the setting of previous skin grafting and trauma. He has least to vein laser ablation scheduled for February 2 for venous reflux. He does not have significant arterial disease. Problem has been very difficult to handle surface slough/necrotic tissue. Recently using Iodoflex for this with some, albeit slow improvement 09/25/16; right lateral greater than right medial lower extremity venous wounds in the setting of previous skin grafting. He is going for ablation surgery on February 2 after this he'll come back here for rewrap. He has been using Iodoflex as the primary dressing. 10/02/16; right lateral greater than right medial lower extremity wounds in the setting of previous skin grafting. He had his ablation surgery last week, I don't have a report. He tolerated this well. Came in with a thigh-high Unna boots on Friday. We have been using Iodoflex as the primary dressing. His measurements are  improving 10/09/16; continues to make nice aggressive in terms of the wounds on his lateral and medial right ankle in the setting of previous skin grafting. Yesterday he noticed drainage at one of his surgical sites from his venous ablation on the right calf. He took off the bandage over this area felt a "popping" sensation and a reddish-brown drainage. He is not complaining of any pain 10/16/16; he continues to make nice progression in terms of the wounds on the lateral and medial malleolus. Both smaller using Iodoflex. He had a surgical area in his posterior mid calf we have been using iodoform. All the wounds are down and dimensions 10/23/16; the patient arrives today with no complaints. He states the Iodoflex is a bit uncomfortable. He is not systemically unwell. We have been using Iodoflex to the lateral right ankle and the medial and Aquacel Ag to the reflux surgical wound on the posterior right calf. All of these wounds are doing well 10/30/16; patient states he has no pain no systemic symptoms. I changed him to [facility] last week. Although the wounds are doing well 11/06/16; patient reports no pain or systemic symptoms. We continue with Hydrofera Blue. Both wound areas on the medial and lateral ankle appear to be doing well with improvement and dimensions and improvement in the wound bed. 11/13/16; patient's dimensions continued to improve. We continue with Hydrofera Blue on the medial and lateral side. Appear to be doing well with healthy granulation and advancing epithelialization 11/20/16; patient's dimensions improving laterally by about half a centimeter in length. Otherwise no change on the medial side. Using [facility] 12/04/16; no major change in patient's wound dimensions. Intake nurse reports more drainage. 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 He is voicing no  complaints or concerns. He has been tolerating his twice weekly compression therapy changes 12/11/16 Patient complains of pain and increased drainage.. wants hydrofera blue 12/18/16  improvement. Sorbact 12/25/16; medial wound is smaller, lateral measures the same. Still on sorbact 01/01/17; medial wound continues to be smaller, lateral measures about the same however there is clearly advancing epithelialization here as well in fact I think the wound will ultimately divided into 2 open areas 01/08/17; unfortunately today fairly significant regression in several areas. Surface of the lateral wound covered again in adherent necrotic material which is difficult to debridement. He has significant surrounding skin maceration. The expanding area of tissue epithelialization in the middle of the wound that was encouraging last week appears to be smaller. There is no surrounding tenderness. The area on the medial leg also did not seem to be as healthy as last week, the reason for this regression this week is not totally clear. We have been using Sorbact for the last 4 weeks. We'll switched of polymen AG which we will order via home medical supply. If there is a problem with this would switch back to Iodoflex 01/15/18; drainage,odor. No change. Switched to polymen last week 01/22/17; still continuous drainage. Culture I did last week showed a few Proteus pansensitive. I did this culture because of drainage. Put him on Augmentin which she has been taking since Saturday however he is developed 4-5 liquid bowel movements. He is also on Coumadin. Beyond this wound is not changed at all, still nonviable necrotic surface material which I debrided reveals healthy granulation line 01/29/17; still copious amounts of drainage reported by her intake nurse. Wound measuring slightly smaller. Currently fact on Iodoflex although I'm looking forward to changing back to perhaps [facility] or polymen AG 02/05/17; still large amounts  of drainage and presenting with really large amounts of adherent slough and necrotic material over the remaining open area of the wound. We have been using Iodoflex but with little improvement in the surface. Change to [facility] 02/12/17; still large amount of drainage. Much less adherent slough however. Started [employer]	[place] last week NEPHTALI, DOCKEN ([id number]) 02/19/17; drainage is better this week. Much less adherent slough. Perhaps some improvement in dimensions. Using [facility] 02/26/17; severe venous insufficiency wounds on the right lateral and right medial leg. Drainage is some better and slough is less adherent we've been using Hydrofera Blue 03/05/17 on evaluation today patient appears to be doing well. His wounds have been decreasing in size and overall he is pleased with how this is progressing. We are awaiting approval for the epigraph which has previously been recommended in the meantime the [facility] Dressing is to be doing very well for him. 03/12/17; wound dimensions are smaller still using Hydrofera Blue. Comes in on Fridays for a dressing change. 03/19/17; wound dimensions continued to contract. Healing of this wound is complicated by continuous significant drainage as well as recurrent buildup of necrotic surface material. We looked into Apligraf and he has a $290 co-pay per application but truthfully I think the drainage as well as the nonviable surface would preclude use of Apligraf there are any other skin substitute at this point therefore the continued plan will be debridement each clinic visit, 2 times a week dressing changes and continued use of Hydrofera Blue. improvement has been very slow but sustained 03/26/17 perhaps slight improvements in peripheral epithelialization is especially inferiorly. Still with large amount of drainage and tightly adherent necrotic surface on arrival. Along with the intake nurse I reviewed previous treatment. He worsened  on Iodoflex and had a 4 week trial of sorbact, Polymen AG and a long courses of Aquacel Ag.  He is not a candidate for advanced treatment options for many reasons 04/09/17 on evaluation today patient's right lower extremity wounds appear to be doing a little better. Fortunately he has no significant discomfort and has been tolerating the dressing changes including the wraps without complication. With that being said he really does not have swelling anymore compared to what he has had in the past following his vascular intervention. He wonders if potentially we could attempt avoiding the rats to see if he could cleanse the wound in between to try to prevent some of the fiber and its buildup that occurs in the interim between when we see him week to week. No fevers, chills, nausea, or vomiting noted at this time. 04/16/17; noted that the staff made a choice last week not to put him in compression. The patient is changing his dressing at home using [facility] and changing every second day. His wife is washing the wound with saline. He is using kerlix. Surprisingly he has not developed a lot of edema. This choice was made because of the degree of fluid retention and maceration even with changing his dressing twice a week 04/23/17; absolutely no change. Using Hydrofera Blue. Recurrent tightly adherent nonviable surface material. This is been refractory to Iodoflex, sorbact and now Hydrofera Blue. More recently he has been changing his own dressings at home, cleansing the wound in the shower. He has not developed lower extremity edema 04/30/17; if anything the wound is larger still in adherent surface although it debrided easily today. I've been using Mehta honey for 1 week and I'd like to try and do it for a second week this see if we can get some form of viable surface here. 05/07/17; patient arrives with copious amounts of drainage and some pain in the superior part of the wound. He has not  been systemically unwell. He's been changing this daily at home 05/14/17; patient arrives today complaining of less drainage and less pain. Dimensions slightly better. Surface culture I did of this last week grew MSSA I put him on dicloxacillin for 10 days. Patient requesting a further prescription since he feels so much better. He did not obtain the [facility] AG for reasons that aren't clear, he has been using Hydrofera Blue 05/21/17; perhaps some less drainage and less pain. He is completing another week of doxycycline. Unfortunately there is no change in the wound measurements are appearance still tightly adherent necrotic surface material that is really defied treatment. We have been using Hydrofera Blue most recently. He did not manage to get Anasept. He was told it was prescription at [facility] 05/29/17; absolutely no improvement in these wound areas. He had remote plastic surgery with who was done at [facility] in [place] this area. This was a traumatic wound with extensive plastic surgery/skin grafting at that time. I switched him to sorbact last week to see if he can do anything about the recurrent necrotic surface and drainage. This is largely been refractory to Iodoflex/Hydrofera Blue/alginates. I believe we tried Elby Showers and more recently Medi honey l however the drainage is really too excessive 06/04/17; patient went to see Dr. Ulice Bold. Per the patient she ordered him a compression stocking. Continued the Anasept gel and sorbact was already on. No major improvement in the condition of the wounds in fact the medial wound looks larger. Again per the patient she did not feel a skin graft or operative debridement was indicated The patient had previous venous ablation in February 2018. Last arterial  studies were in December 2017 and were within normal limits 06/18/17; patient arrives back in clinic today using Anasept gel with sorbact. He changes this every day is wearing his  own compression stocking. The patient actually saw Dr. Leta Baptist of plastic surgery. I've reviewed her note. The appointment was on 05/30/17. The basic issue with here was that she did not feel that skin grafts for patients with underlying stasis and edema have a good long-term success rate. She also discuss skin substitutes which has been done here as well however I have not been able to get the surface of these wounds to something that I think would support skin substitutes. This is why he felt he might need an EYAD, ROCHFORD. ([id number]) operative debridement more aggressively than I can do in this clinic Review of systems; is otherwise negative he feels well 07/02/17; patient has been using Anasept gel with Sorbact. He changes this every day scrubs out the wound beds. Arrives today with the wound bed looking some better. Easier debridement 07/16/17; patient has been using Anasept gel was sorbact for about a month now. The necrotic service on his wound bed is better and the drainage is now down to minimal but we've not made any improvements in the epithelialization. Change to Santyl today. Surface of the wound is still not good enough to support skin substitutes 07/30/17 on evaluation today patient appears to be doing very well in regard to his right bimalleolus wounds. He has been tolerating the treatment with the Anasept gel and sorbact. However we were going to try and switch to Santyl to be more aggressive with these wounds. This was however cost prohibitive. Therefore we will likely go back to the sorbact. Nonetheless he is not having any significant discomfort he still is forming slough over the wound location but nothing too significant. The wounds do appear to be filling in nicely. 08/13/17; I have not seen this wound in about a month. There is not much change. The fact the area medially looks larger. He has been using sorbact and Anasept for over a month now without much effect.  Arrives in clinic stating that he does not want the area debrided 08/28/17; patient arrives with the areas on his right medial and right lateral ankle worse. The wounds of expanded there is erythema and still drainage. There is no pain and no tenderness. We had been using Keracel AG clearly not doing well. 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 He states that even the evening that we began utilizing the Iodoflex he started having burning. Subsequently this never really improved and he eventually discontinue the use of the Iodoflex altogether. Unfortunately he did not let us know about any of this until today. I'm unsure of any specific infection at this point although I am going to obtain a wound culture today in order to see if there's anything that could potentially be causing an infection as well. It does sound as if he's had the Iodoflex before and did not have this kind of reaction although this does sound very specific for a reaction to the Iodoflex. 09/17/17 on evaluation today patient appears to be doing significantly better compared to last week's evaluation. At that time  it actually appears that he did have an infection the good news is that we have been able to get this under control with switching to the [facility] solution he's not having as much pain and discomfort he still does have some erythema surrounding the medial aspect wound. He has been tolerating the Dakin's soaked gauze dressing which is excellent and that his wounds do not seem to have as much adherent slough. Overall I'm pleased with the progress she's made in last week. 10/01/17 on evaluation today patient appears to be doing a little better compared to last week.  His wounds do not seem to be as macerated and overall I'm pleased with the appearance of the wound bed. He has been tolerating the dressing changes the only issue is he did not get the Prisma as far as the order that was placed and therefore he has not been able to reapply this himself. He's a little frustrating that regard. However overall I'm pleased with how things have progressed. Patient History Information obtained from Patient. Social History Former smoker - 10 years ago, Marital Status - Married, Alcohol Use - Moderate, Drug Use - No History, Caffeine Use - Never. Medical And Surgical History Notes Constitutional Symptoms (General Health) Vein Filter (groin area); CODA; H/O Blood Clots; pulmonary hypertensive arterial disease Review of Systems (ROS) Constitutional Symptoms (General Health) Denies complaints or symptoms of Fever, Chills. Respiratory The patient has no complaints or symptoms. Cardiovascular Complains or has symptoms of LE edema. Psychiatric YIANNI, SKILLING ([id number]) The patient has no complaints or symptoms. Objective Constitutional Well-nourished and well-hydrated in no acute distress. Vitals Time Taken: 8:19 AM, Height: 76 in, Weight: 238 lbs, BMI: 29, Temperature: 98.4 F, Pulse: 84 bpm, Respiratory Rate: 18 breaths/min, Blood Pressure: 132/71 mmHg. Respiratory normal breathing without difficulty. Psychiatric this patient is able to make decisions and demonstrates good insight into disease process. Alert and Oriented x 3. pleasant and cooperative. General Notes: Patient's wound does show slough covering but this was easily removed as far as sharp debridement was concerned he did not even seem to have as much bleeding as last week which leads me to believe there's not as much irritation and breakdown in general. Post debridement the wound bed appear to be doing much better and there did not appear to be any evidence of infection which is great  news. Integumentary (Hair, Skin) Wound #1 status is Open. Original cause of wound was Gradually Appeared. The wound is located on the Right,Lateral Malleolus. The wound measures 7cm length x 6.8cm width x 0.2cm depth; 37.385cm^2 area and 7.477cm^3 volume. There is Fat Layer (Subcutaneous Tissue) Exposed exposed. There is no tunneling or undermining noted. There is a large amount of serous drainage noted. The wound margin is distinct with the outline attached to the wound base. There is medium (34-66%) red granulation within the wound bed. There is a medium (34-66%) amount of necrotic tissue within the wound bed including Adherent Slough. The periwound skin appearance exhibited: Excoriation, Scarring, Maceration, Hemosiderin Staining, Erythema. The periwound skin appearance did not exhibit: Callus, Crepitus, Induration, Rash, Dry/Scaly, Atrophie Blanche, Cyanosis, Ecchymosis, Mottled, Pallor, Rubor. The surrounding wound skin color is noted with erythema which is circumferential. Periwound temperature was noted as No Abnormality. The periwound has tenderness on palpation. Wound #2 status is Open. Original cause of wound was Gradually Appeared. The wound is located on the Right,Medial Malleolus. The wound measures 4cm length x 3.8cm width x 0.1cm depth; 11.938cm^2 area and 1.194cm^3 volume. There is  Fat Layer (Subcutaneous Tissue) Exposed exposed. There is no tunneling or undermining noted. There is a large amount of serous drainage noted. The wound margin is distinct with the outline attached to the wound base. There is medium (34-66%) red granulation within the wound bed. There is a medium (34-66%) amount of necrotic tissue within the wound bed including Adherent Slough. The periwound skin appearance exhibited: Excoriation, Scarring, Maceration, Hemosiderin Staining, Erythema. The periwound skin appearance did not exhibit: Callus, Crepitus, Induration, Rash, Dry/Scaly, Atrophie Blanche, Cyanosis,  Ecchymosis, Mottled, Pallor, Rubor. The surrounding wound skin color is noted with erythema which is circumferential. Periwound temperature was noted as No Abnormality. The periwound has tenderness on palpation. 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 There was a Skin/Subcutaneous Tissue Debridement ([id number]) debridement with total area of 47.6 sq cm performed by STONE III, Shalisha Clausing E., PA-C. with the following instrument(s): Curette to remove Viable and Non-Viable tissue/material including Exudate, Fibrin/Slough, and Subcutaneous after achieving pain control using Lidocaine 4% Topical Solution. A time out was conducted at 08:32, prior to the start of the procedure. A Minimum amount of bleeding was controlled with Pressure. The procedure was tolerated well with a pain level of 0 throughout and a pain level of 0 following the procedure. Post Debridement Measurements: 7cm length x 6.8cm width x 0.3cm depth; 11.215cm^3 volume. Character of Wound/Ulcer Post Debridement requires further debridement. Severity of Tissue Post Debridement is: Fat layer exposed. Post procedure Diagnosis Wound #1: Same as Pre-Procedure Wound #2 Pre-procedure diagnosis of Wound #2  is a Venous Leg Ulcer located on the Right,Medial Malleolus .Severity of Tissue Pre Debridement is: Fat layer exposed. There was a Skin/Subcutaneous Tissue Debridement ([id number]) debridement with total area of 15.2 sq cm performed by STONE III, Mathhew Buysse E., PA-C. with the following instrument(s): Curette to remove Viable and Non-Viable tissue/material including Exudate, Fibrin/Slough, and Subcutaneous after achieving pain control using Lidocaine 4% Topical Solution. A time out was conducted at 08:32, prior to the start of the procedure. A Minimum amount of bleeding was controlled with Pressure. The procedure was tolerated well with a pain level of 0 throughout and a pain level of 0 following the procedure. Post Debridement Measurements: 4cm length x 3.8cm width x 0.2cm depth; 2.388cm^3 volume. Character of Wound/Ulcer Post Debridement requires further debridement. Severity of Tissue Post Debridement is: Fat layer exposed. Post procedure Diagnosis Wound #2: Same as Pre-Procedure Plan Wound Cleansing: Wound #1 Right,Lateral Malleolus: Clean wound with Normal Saline. Cleanse wound with mild soap and water May Shower, gently pat wound dry prior to applying new dressing. Wound #2 Right,Medial Malleolus: Clean wound with Normal Saline. Cleanse wound with mild soap and water May Shower, gently pat wound dry prior to applying new dressing. YADEN, SEITH ([id number]) Anesthetic (add to Medication List): Wound #1 Right,Lateral Malleolus: Topical Lidocaine 4% cream applied to wound bed prior to debridement (In Clinic Only). Wound #2  Right,Medial Malleolus: Topical Lidocaine 4% cream applied to wound bed prior to debridement (In Clinic Only). Skin Barriers/Peri-Wound Care: Wound #1 Right,Lateral Malleolus: Barrier cream Wound #2 Right,Medial Malleolus: Barrier cream Primary Wound Dressing: Wound #1 Right,Lateral Malleolus: Hydrogel Prisma Ag Wound #2 Right,Medial  Malleolus: Hydrogel Prisma Ag Secondary Dressing: Wound #1 Right,Lateral Malleolus: ABD pad Dry Gauze Conform/Kerlix XtraSorb Wound #2 Right,Medial Malleolus: ABD pad Dry Gauze Conform/Kerlix XtraSorb Dressing Change Frequency: Wound #1 Right,Lateral Malleolus: Change dressing every day. Wound #2 Right,Medial Malleolus: Change dressing every day. Follow-up Appointments: Wound #1 Right,Lateral Malleolus: Return Appointment in 1 week. Wound #2 Right,Medial Malleolus: Return Appointment in 1 week. Edema Control: Wound #1 Right,Lateral Malleolus: Elevate legs to the level of the heart and pump ankles as often as possible Wound #2 Right,Medial Malleolus: Elevate legs to the level of the heart and pump ankles as often as possible Additional Orders / Instructions: Wound #1 Right,Lateral Malleolus: Increase protein intake. Wound #2 Right,Medial Malleolus: Increase protein intake. Medications-please add to medication list.: Wound #1 Right,Lateral Malleolus: Other: - Vitamin C, Zinc, MVI Wound #2 Right,Medial Malleolus: Other: - Vitamin C, Zinc, MVI The following medication(s) was prescribed: lidocaine topical 4 % cream 1 1 cream topical was prescribed at facility [facility] ([id number]) I'm going to recommend at this point that we continue with the Prisma we will apply this day and we have gotten a confirmation that he should be getting his supplies today as well. Therefore he should have it for his next wound care change. We will see were things stand in one weeks time. Please see above for specific wound care orders. We will see patient for re-evaluation in 1 week(s) here in the clinic. If anything worsens or changes patient will contact our office for additional recommendations. Electronic Signature(s) Signed: 10/29/2017 7:21:18 PM By: Lenda Kelp PA-C Entered By: Lenda Kelp on 10/29/2017 08:45:29 [name]  ([id number]) -------------------------------------------------------------------------------- ROS/PFSH Details Patient Name: [name] Date of Service: 10/29/2017 8:00 AM Medical Record Number: [id number] Patient Account Number: [id number] Date of Birth/Sex: 1946-06-16 (72 y.o. Male) Treating RN: Ashok Cordia, [name] Primary Care Provider: [name] Other Clinician: Referring Provider: [name] Treating Provider/Extender: STONE III, Regena Delucchi Weeks in Treatment: 69 Information Obtained From Patient Wound History Do you currently have one or more open woundso Yes How many open wounds do you currently haveo 1 Approximately how long have you had your woundso 5 months How have you been treating your wound(s) until nowo saline, dressing Has your wound(s) ever healed and then re-openedo Yes Have you had any lab work done in the past montho No Have you tested positive for an antibiotic resistant organism (MRSA, VRE)o No Have you tested positive for osteomyelitis (bone infection)o No Have you had any tests for circulation on your legso No Constitutional Symptoms (General Health) Complaints and Symptoms: Negative for: Fever; Chills Medical History: Past Medical History Notes: Vein Filter (groin area); CODA; H/O Blood Clots; pulmonary hypertensive arterial disease Cardiovascular Complaints and Symptoms: Positive for: LE edema Medical History: Negative for: Angina; Arrhythmia; Congestive Heart Failure; Coronary Artery Disease; Deep Vein Thrombosis; Hypertension; Hypotension; Myocardial Infarction; Peripheral Arterial Disease; Peripheral Venous Disease; Phlebitis; Vasculitis Eyes Medical History: Positive for: Cataracts - removed Negative for: Glaucoma; Optic Neuritis Ear/Nose/Mouth/Throat Medical History: Negative for: Chronic sinus problems/congestion Hematologic/Lymphatic Medical History: Negative for: Anemia; Hemophilia; Human Immunodeficiency Virus; Lymphedema; Sickle Cell  Disease Respiratory Fronek, Azazel C. ([id number]) Complaints and Symptoms: No Complaints or Symptoms Medical History: Positive for: Chronic Obstructive Pulmonary Disease (  COPD) Negative for: Aspiration; Asthma; Pneumothorax; Sleep Apnea; Tuberculosis Gastrointestinal Medical History: Negative for: Cirrhosis ; Colitis; Crohnos; Hepatitis A; Hepatitis B; Hepatitis C Endocrine Medical History: Negative for: Type I Diabetes; Type II Diabetes Genitourinary Medical History: Negative for: End Stage Renal Disease Immunological Medical History: Negative for: Lupus Erythematosus; Raynaudos Integumentary (Skin) Medical History: Negative for: History of Burn; History of pressure wounds Musculoskeletal Medical History: Positive for: Osteoarthritis Negative for: Gout; Rheumatoid Arthritis; Osteomyelitis Neurologic Medical History: Negative for: Dementia; Neuropathy; Quadriplegia; Paraplegia; Seizure Disorder Oncologic Medical History: Negative for: Received Chemotherapy; Received Radiation Psychiatric Complaints and Symptoms: No Complaints or Symptoms Medical History: Negative for: Anorexia/bulimia; Confinement Anxiety HBO Extended History Items Eyes: Cataracts Kupfer, Rourke C. ([id number]) Immunizations Pneumococcal Vaccine: Received Pneumococcal Vaccination: No Implantable Devices Family and Social History Former smoker - 10 years ago; Marital Status - Married; Alcohol Use: Moderate; Drug Use: No History; Caffeine Use: Never; Advanced Directives: No; Patient does not want information on Advanced Directives; Living Will: No; Medical Power of Attorney: No Physician Affirmation I have reviewed and agree with the above information. Electronic Signature(s) Signed: 10/29/2017 7:21:18 PM By: Lenda Kelp PA-C Signed: 10/30/2017 4:15:20 PM By: Alejandro Mulling Entered By: Lenda Kelp on 10/29/2017 08:44:42 [name]  ([id number]) -------------------------------------------------------------------------------- SuperBill Details Patient Name: [name] Date of Service: 10/29/2017 Medical Record Number: [id number] Patient Account Number: [id number] Date of Birth/Sex: [birth year] (72 y.o. 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 and decellularized allodermis, initial encounter T81.31XD Disruption of external operation (surgical) wound, not elsewhere classified, subsequent encounter L97.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 of skin Facility Procedures CPT4 Code: 08657846 Description: 11042 - DEB SUBQ TISSUE 20 SQ CM/< ICD-10 Diagnosis Description L97.212 Non-pressure chronic ulcer of right calf with fat layer expos Modifier: ed Quantity: 1 CPT4 Code: 96295284 Description: 11045 - DEB SUBQ TISS EA ADDL 20CM ICD-10 Diagnosis Description L97.212 Non-pressure chronic ulcer of right calf with fat layer expos Modifier: ed Quantity: 3 Physician Procedures CPT4 Code: 1324401 Description: 11042 - WC PHYS SUBQ TISS 20 SQ CM ICD-10 Diagnosis Description L97.212 Non-pressure chronic ulcer of right calf with fat layer expose Modifier: d Quantity: 1 CPT4 Code: 0272536 Description: 11045 - WC PHYS SUBQ TISS EA ADDL 20 CM ICD-10 Diagnosis Description L97.212 Non-pressure chronic ulcer of right calf with fat layer expose Modifier: d Quantity: 3 Electronic Signature(s) Signed: 10/29/2017  7:21:18 PM By: Lenda Kelp PA-C Entered By: Lenda Kelp on 10/29/2017 08:46:53

## 2017-11-01 NOTE — Progress Notes (Signed)
HARNOOR, KOHLES ([id number]) Visit Report for 10/29/2017 Arrival Information Details Patient Name: [name], [name]. Date of Service: 10/29/2017 8:00 AM Medical Record Number: [id number] Patient Account Number: [id number] Date of Birth/Sex: August 26, 1946 (72 y.o. Male) Treating RN: Renne Crigler Primary Care Wednesday Ericsson: [name] Other Clinician: Referring Labradford Schnitker: [name] Treating Alexio Sroka/Extender: Linwood Dibbles, HOYT Weeks in Treatment: 93 Visit Information History Since Last Visit All ordered tests and consults were completed: No Patient Arrived: Ambulatory Added or deleted any medications: No Arrival Time: 08:18 Any new allergies or adverse reactions: No Accompanied By: self Had a fall or experienced change in No Transfer Assistance: None activities of daily living that may affect Patient Identification Verified: Yes risk of falls: Secondary Verification Process Yes Signs or symptoms of abuse/neglect since last visito No Completed: Hospitalized since last visit: No Patient Requires Transmission-Based No Pain Present Now: Yes Precautions: Patient Has Alerts: Yes Patient Alerts: Patient on Blood Thinner Electronic Signature(s) Signed: 10/30/2017 4:15:18 PM By: Renne Crigler Entered By: Renne Crigler on 10/29/2017 08:18:59 [name] ([id number]) -------------------------------------------------------------------------------- Encounter Discharge Information Details Patient Name: [name] Date of Service: 10/29/2017 8:00 AM Medical Record Number: [id number] Patient Account Number: [id number] Date of Birth/Sex: Feb 17, 1946 (72 y.o. Male) Treating RN: Renne Crigler Primary Care Hosanna Betley: [name] Other Clinician: Referring Jeyla Bulger: [name] Treating Aneyah Lortz/Extender: STONE III, HOYT Weeks in Treatment: 43 Encounter Discharge Information Items Discharge Pain Level: 0 Discharge Condition: Stable Ambulatory Status:  Ambulatory Discharge Destination: Home Transportation: Private Auto Accompanied By: self Schedule Follow-up Appointment: Yes Medication Reconciliation completed and No provided to Patient/Care Atwood Adcock: Patient Clinical Summary of Care: Declined Electronic Signature(s) Signed: 10/31/2017 11:42:25 AM By: Gwenlyn Perking Entered By: Gwenlyn Perking on 10/29/2017 08:50:43 [name] ([id number]) -------------------------------------------------------------------------------- Lower Extremity Assessment Details Patient Name: [name] Date of Service: 10/29/2017 8:00 AM Medical Record Number: [id number] Patient Account Number: [id number] Date of Birth/Sex: 06/04/1946 (72 y.o. Male) Treating RN: Renne Crigler Primary Care Jalacia Mattila: [name] Other Clinician: Referring Jakyri Brunkhorst: [name] Treating Shalan Neault/Extender: STONE III, HOYT Weeks in Treatment: 93 Edema Assessment Assessed: [Left: No] [Right: No] Edema: [Left: N] [Right: o] Vascular Assessment Claudication: Claudication Assessment [Right:None] Pulses: Dorsalis Pedis Palpable: [Right:Yes] Posterior Tibial Extremity colors, hair growth, and conditions: Extremity Color: [Right:Hyperpigmented] Hair Growth on Extremity: [Right:Yes] Temperature of Extremity: [Right:Warm] Capillary Refill: [Right:< 3 seconds] Toe Nail Assessment Left: Right: Thick: Yes Discolored: Yes Deformed: No Improper Length and Hygiene: Yes Electronic Signature(s) Signed: 10/30/2017 4:15:18 PM By: Renne Crigler Entered By: Renne Crigler on 10/29/2017 08:25:54 [name] ([id number]) -------------------------------------------------------------------------------- Multi Wound Chart Details Patient Name: [name] Date of Service: 10/29/2017 8:00 AM Medical Record Number: [id number] Patient Account Number: [id number] Date of Birth/Sex: [birth year] (72 y.o. Male) Treating RN: [name] Primary Care  Jovian Lembcke: [name] Other Clinician: Referring Eduin Friedel: [name] Treating Sheyenne Konz/Extender: STONE III, HOYT Weeks in Treatment: 93 Vital Signs Height(in): 76 Pulse(bpm): 84 Weight(lbs): 238 Blood Pressure(mmHg): 132/71 Body Mass Index(BMI): 29 Temperature(F): 98.4 Respiratory Rate 18 (breaths/min): Photos: [N/A:N/A] Wound Location: Right Malleolus - Lateral Right Malleolus - Medial N/A Wounding Event: Gradually Appeared Gradually Appeared N/A Primary Etiology: Venous Leg Ulcer Venous Leg Ulcer N/A Comorbid History: Cataracts, Chronic Obstructive Cataracts, Chronic Obstructive N/A Pulmonary Disease (COPD), Pulmonary Disease (COPD), Osteoarthritis Osteoarthritis Date Acquired: 08/11/2015 01/30/2016 N/A Weeks of Treatment: 93 91 N/A Wound Status: Open Open N/A Clustered Wound: No Yes N/A Measurements L x W x D 7x6.8x0.2 4x3.8x0.1 N/A (cm) Area (cm) : 37.385 11.938 N/A Volume (cm) :  7.477 1.194 N/A % Reduction in Area: -116.40% -3.40% N/A % Reduction in Volume: -44.20% -3.40% N/A Classification: Full Thickness Without Full Thickness Without N/A Exposed Support Structures Exposed Support Structures Exudate Amount: Large Large N/A Exudate Type: Serous Serous N/A Exudate Color: amber amber N/A Wound Margin: Distinct, outline attached Distinct, outline attached N/A Granulation Amount: Medium (34-66%) Medium (34-66%) N/A Granulation Quality: Red Red N/A Necrotic Amount: Medium (34-66%) Medium (34-66%) N/A Exposed Structures: Fat Layer (Subcutaneous Fat Layer (Subcutaneous N/A Tissue) Exposed: Yes Tissue) Exposed: Yes Fascia: No Fascia: No Tendon: No Tendon: No Ewart, Jhace C. ([id number]) Muscle: No Muscle: No Joint: No Joint: No Bone: No Bone: No Epithelialization: None None N/A Periwound Skin Texture: Excoriation: Yes Excoriation: Yes N/A Scarring: Yes Scarring: Yes Induration: No Induration: No Callus: No Callus: No Crepitus: No Crepitus:  No Rash: No Rash: No Periwound Skin Moisture: Maceration: Yes Maceration: Yes N/A Dry/Scaly: No Dry/Scaly: No Periwound Skin Color: Erythema: Yes Erythema: Yes N/A Hemosiderin Staining: Yes Hemosiderin Staining: Yes Atrophie Blanche: No Atrophie Blanche: No Cyanosis: No Cyanosis: No Ecchymosis: No Ecchymosis: No Mottled: No Mottled: No Pallor: No Pallor: No Rubor: No Rubor: No Erythema Location: Circumferential Circumferential N/A Temperature: No Abnormality No Abnormality N/A Tenderness on Palpation: Yes Yes N/A Wound Preparation: Ulcer Cleansing: Ulcer Cleansing: N/A Rinsed/Irrigated with Saline Rinsed/Irrigated with Saline Topical Anesthetic Applied: Topical Anesthetic Applied: Other: lidocaine 4% Other: lidocaine 4% Treatment Notes Electronic Signature(s) Signed: 10/30/2017 4:15:20 PM By: Alejandro MullingPinkerton, Debra Entered By: Alejandro MullingPinkerton, Debra on 10/29/2017 08:32:07 [name] StackBLACKWELL, [name] ([id number]) -------------------------------------------------------------------------------- Multi-Disciplinary Care Plan Details Patient Name: [name] StackBLACKWELL, Jorey C. Date of Service: 10/29/2017 8:00 AM Medical Record Number: [id number] Patient Account Number: [id number] Date of Birth/Sex: 03/13/1946 [age] y.o. Male) Treating RN: Phillis HaggisPinkerton, [name] Primary Care Collier Bohnet: Darreld McleanMILES, LINDA Other Clinician: Referring Emilygrace Grothe: Darreld McleanMILES, LINDA Treating Amiaya Mcneeley/Extender: STONE III, HOYT Weeks in Treatment: 93 Active Inactive ` Venous Leg Ulcer Nursing Diagnoses: Knowledge deficit related to disease process and management Goals: Patient will maintain optimal edema control Date Initiated: 04/03/2016 Target Resolution Date: 11/24/2016 Goal Status: Active Interventions: Compression as ordered Treatment Activities: Therapeutic compression applied : 04/03/2016 Notes: ` Wound/Skin Impairment Nursing Diagnoses: Impaired tissue integrity Goals: Ulcer/skin breakdown will heal within 14 weeks Date Initiated:  01/17/2016 Target Resolution Date: 11/24/2016 Goal Status: Active Interventions: Assess ulceration(s) every visit Notes: Electronic Signature(s) Signed: 10/30/2017 4:15:20 PM By: Alejandro MullingPinkerton, Debra Entered By: Alejandro MullingPinkerton, Debra on 10/29/2017 08:31:59 [name] StackBLACKWELL, Filipe C. ([id number]) -------------------------------------------------------------------------------- Pain Assessment Details Patient Name: [name] StackBLACKWELL, Murrel C. Date of Service: 10/29/2017 8:00 AM Medical Record Number: [id number] Patient Account Number: [id number] Date of Birth/Sex: 03/13/1946 [age] y.o. Male) Treating RN: Renne CriglerFlinchum, Cheryl Primary Care Kalee Mcclenathan: Darreld McleanMILES, LINDA Other Clinician: Referring Catherene Kaleta: Darreld McleanMILES, LINDA Treating Johannah Rozas/Extender: STONE III, HOYT Weeks in Treatment: 93 Active Problems Location of Pain Severity and Description of Pain Patient Has Paino Yes Site Locations Duration of the Pain. Constant / Intermittento Intermittent Rate the pain. Current Pain Level: 3 Character of Pain Describe the Pain: Other: stinging Pain Management and Medication Current Pain Management: Electronic Signature(s) Signed: 10/30/2017 4:15:18 PM By: Renne CriglerFlinchum, Cheryl Entered By: Renne CriglerFlinchum, Cheryl on 10/29/2017 08:19:21 [name] StackBLACKWELL, Boykin C. ([id number]) -------------------------------------------------------------------------------- Patient/Caregiver Education Details Patient Name: [name] StackBLACKWELL, Creedence C. Date of Service: 10/29/2017 8:00 AM Medical Record Number: [id number] Patient Account Number: [id number] Date of Birth/Gender: 03/13/1946 [age] y.o. Male) Treating RN: Renne CriglerFlinchum, Cheryl Primary Care Physician: Darreld McleanMILES, LINDA Other Clinician: Referring Physician: Darreld McleanMILES, LINDA Treating Physician/Extender: Lenda KelpSTONE III, HOYT Weeks in Treatment: 9393 Education Assessment Education Provided To: Patient Education Topics Provided Wound Debridement:  Handouts: Wound Debridement Methods: Explain/Verbal Responses: State content  correctly Wound/Skin Impairment: Handouts: Caring for Your Ulcer Methods: Explain/Verbal Responses: State content correctly Electronic Signature(s) Signed: 10/30/2017 4:15:18 PM By: Renne Crigler Entered By: Renne Crigler on 10/29/2017 08:49:49 [name] ([id number]) -------------------------------------------------------------------------------- Wound Assessment Details Patient Name: [name] Date of Service: 10/29/2017 8:00 AM Medical Record Number: [id number] Patient Account Number: [id number] Date of Birth/Sex: Feb 24, 1946 (72 y.o. Male) Treating RN: Renne Crigler Primary Care Ayomikun Starling: [name] Other Clinician: Referring Keyra Virella: [name] Treating Ruvi Fullenwider/Extender: STONE III, HOYT Weeks in Treatment: 93 Wound Status Wound Number: 1 Primary Venous Leg Ulcer Etiology: Wound Location: Right Malleolus - Lateral Wound Open Wounding Event: Gradually Appeared Status: Date Acquired: 08/11/2015 Comorbid Cataracts, Chronic Obstructive Pulmonary Weeks Of Treatment: 93 History: Disease (COPD), Osteoarthritis Clustered Wound: No Photos Photo Uploaded By: Renne Crigler on 10/29/2017 08:31:44 Wound Measurements Length: (cm) 7 Width: (cm) 6.8 Depth: (cm) 0.2 Area: (cm) 37.385 Volume: (cm) 7.477 % Reduction in Area: -116.4% % Reduction in Volume: -44.2% Epithelialization: None Tunneling: No Undermining: No Wound Description Full Thickness Without Exposed Support Classification: Structures Wound Margin: Distinct, outline attached Exudate Large Amount: Exudate Type: Serous Exudate Color: amber Foul Odor After Cleansing: No Slough/Fibrino Yes Wound Bed Granulation Amount: Medium (34-66%) Exposed Structure Granulation Quality: Red Fascia Exposed: No Necrotic Amount: Medium (34-66%) Fat Layer (Subcutaneous Tissue) Exposed: Yes Necrotic Quality: Adherent Slough Tendon Exposed: No Muscle Exposed: No Joint Exposed: No Bone  Exposed: No Muhs, Silus C. ([id number]) Periwound Skin Texture Texture Color No Abnormalities Noted: No No Abnormalities Noted: No Callus: No Atrophie Blanche: No Crepitus: No Cyanosis: No Excoriation: Yes Ecchymosis: No Induration: No Erythema: Yes Rash: No Erythema Location: Circumferential Scarring: Yes Hemosiderin Staining: Yes Mottled: No Moisture Pallor: No No Abnormalities Noted: No Rubor: No Dry / Scaly: No Maceration: Yes Temperature / Pain Temperature: No Abnormality Tenderness on Palpation: Yes Wound Preparation Ulcer Cleansing: Rinsed/Irrigated with Saline Topical Anesthetic Applied: Other: lidocaine 4%, Treatment Notes Wound #1 (Right, Lateral Malleolus) 1. Cleansed with: Clean wound with Normal Saline 2. Anesthetic Topical Lidocaine 4% cream to wound bed prior to debridement 4. Dressing Applied: Hydrogel Prisma Ag 5. Secondary Dressing Applied ABD Pad Notes xtrasorb, kerlix wrap Electronic Signature(s) Signed: 10/30/2017 4:15:18 PM By: Renne Crigler Entered By: Renne Crigler on 10/29/2017 08:65:78 [name] ([id number]) -------------------------------------------------------------------------------- Wound Assessment Details Patient Name: [name] Date of Service: 10/29/2017 8:00 AM Medical Record Number: [id number] Patient Account Number: [id number] Date of Birth/Sex: Mar 15, 1946 (72 y.o. Male) Treating RN: Renne Crigler Primary Care Anais Koenen: [name] Other Clinician: Referring Ryliee Figge: [name] Treating Lenor Provencher/Extender: STONE III, HOYT Weeks in Treatment: 93 Wound Status Wound Number: 2 Primary Venous Leg Ulcer Etiology: Wound Location: Right Malleolus - Medial Wound Open Wounding Event: Gradually Appeared Status: Date Acquired: 01/30/2016 Comorbid Cataracts, Chronic Obstructive Pulmonary Weeks Of Treatment: 91 History: Disease (COPD), Osteoarthritis Clustered Wound: Yes Photos Photo Uploaded  By: Renne Crigler on 10/29/2017 08:32:00 Wound Measurements Length: (cm) 4 Width: (cm) 3.8 Depth: (cm) 0.1 Area: (cm) 11.938 Volume: (cm) 1.194 % Reduction in Area: -3.4% % Reduction in Volume: -3.4% Epithelialization: None Tunneling: No Undermining: No Wound Description Full Thickness Without Exposed Support Classification: Structures Wound Margin: Distinct, outline attached Exudate Large Amount: Exudate Type: Serous Exudate Color: amber Foul Odor After Cleansing: No Slough/Fibrino Yes Wound Bed Granulation Amount: Medium (34-66%) Exposed Structure Granulation Quality: Red Fascia Exposed: No Necrotic Amount: Medium (34-66%) Fat Layer (Subcutaneous Tissue) Exposed: Yes Necrotic Quality: Adherent Slough Tendon Exposed: No Muscle  Exposed: No Joint Exposed: No Bone Exposed: No Milford, Dillen C. ([id number]) Periwound Skin Texture Texture Color No Abnormalities Noted: No No Abnormalities Noted: No Callus: No Atrophie Blanche: No Crepitus: No Cyanosis: No Excoriation: Yes Ecchymosis: No Induration: No Erythema: Yes Rash: No Erythema Location: Circumferential Scarring: Yes Hemosiderin Staining: Yes Mottled: No Moisture Pallor: No No Abnormalities Noted: No Rubor: No Dry / Scaly: No Maceration: Yes Temperature / Pain Temperature: No Abnormality Tenderness on Palpation: Yes Wound Preparation Ulcer Cleansing: Rinsed/Irrigated with Saline Topical Anesthetic Applied: Other: lidocaine 4%, Treatment Notes Wound #2 (Right, Medial Malleolus) 1. Cleansed with: Clean wound with Normal Saline 2. Anesthetic Topical Lidocaine 4% cream to wound bed prior to debridement 4. Dressing Applied: Hydrogel Prisma Ag 5. Secondary Dressing Applied ABD Pad Notes xtrasorb, kerlix wrap Electronic Signature(s) Signed: 10/30/2017 4:15:18 PM By: Renne Crigler Entered By: Renne Crigler on 10/29/2017 10:27:25 [name]  ([id number]) -------------------------------------------------------------------------------- Vitals Details Patient Name: [name] Date of Service: 10/29/2017 8:00 AM Medical Record Number: [id number] Patient Account Number: [id number] Date of Birth/Sex: [birth year] (72 y.o. Male) Treating RN: Renne Crigler Primary Care Guerino Caporale: [name] Other Clinician: Referring Corlene Sabia: [name] Treating Seibert Keeter/Extender: STONE III, HOYT Weeks in Treatment: 93 Vital Signs Time Taken: 08:19 Temperature (F): 98.4 Height (in): 76 Pulse (bpm): 84 Weight (lbs): 238 Respiratory Rate (breaths/min): 18 Body Mass Index (BMI): 29 Blood Pressure (mmHg): 132/71 Reference Range: 80 - 120 mg / dl Electronic Signature(s) Signed: 10/30/2017 4:15:18 PM By: Renne Crigler Entered By: Renne Crigler on 10/29/2017 08:24:18

## 2017-11-05 ENCOUNTER — Encounter: Payer: Medicare HMO | Admitting: Physician Assistant

## 2017-11-05 DIAGNOSIS — I87331 Chronic venous hypertension (idiopathic) with ulcer and inflammation of right lower extremity: Secondary | ICD-10-CM | POA: Diagnosis not present

## 2017-11-05 IMAGING — CR DG ANKLE COMPLETE 3+V*R*
1 series · 3 of 3 positions shown · non-contrast
Comparison: CT 01/30/2014 .

CLINICAL DATA: Pain.  No known injury.  Wound.

EXAM:
RIGHT ANKLE - COMPLETE 3+ VIEW

[Series 1: dg ankle complete right · 0.14mm/px · 3 of 3 slices shown]
[im 1/3]
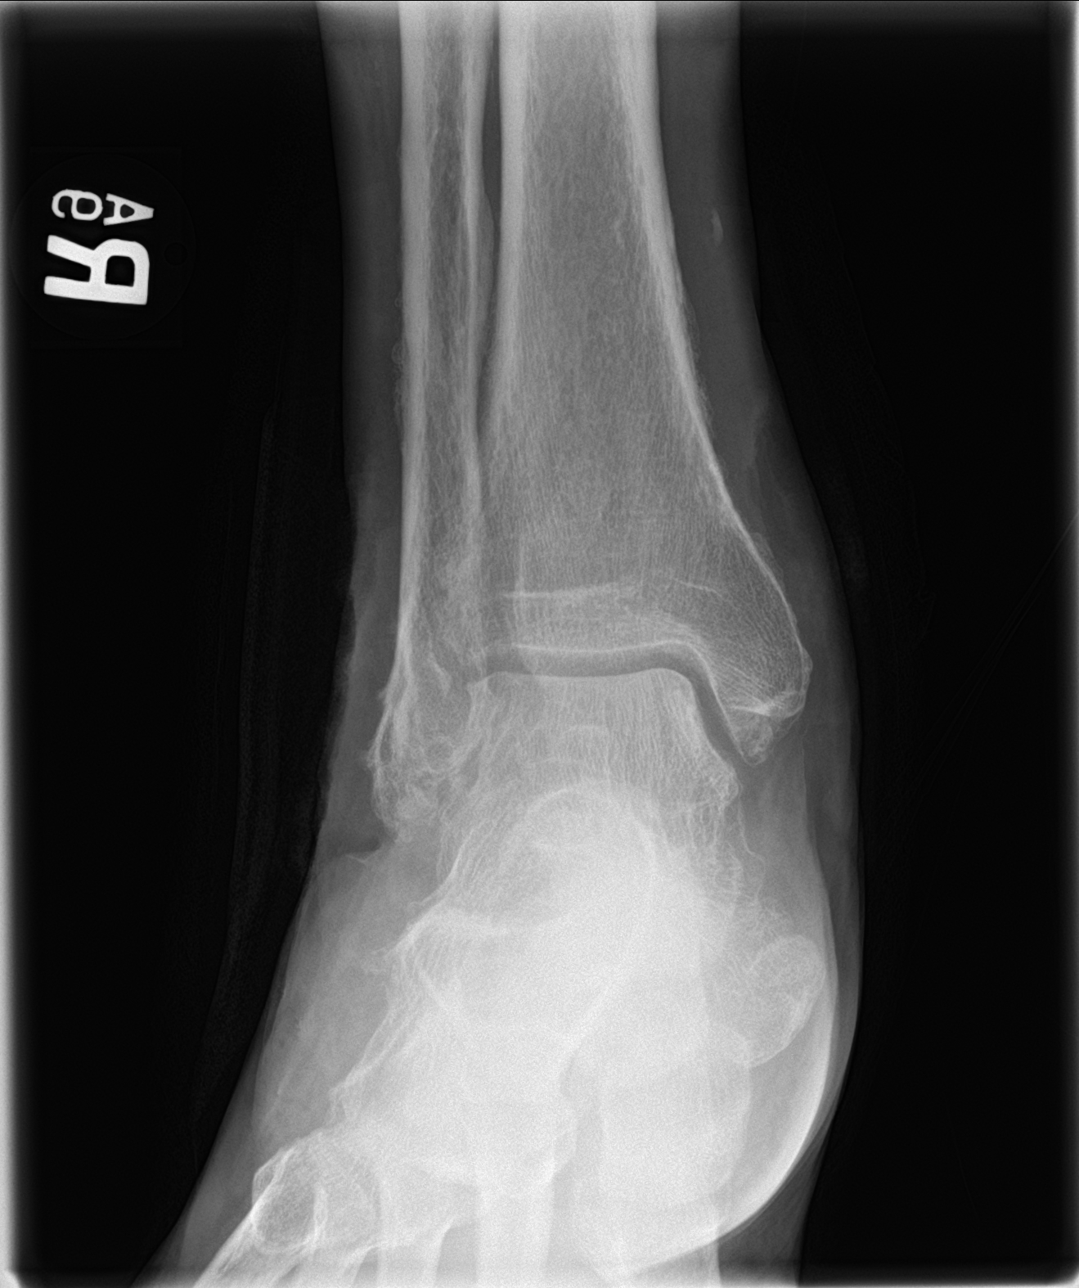
[im 2/3]
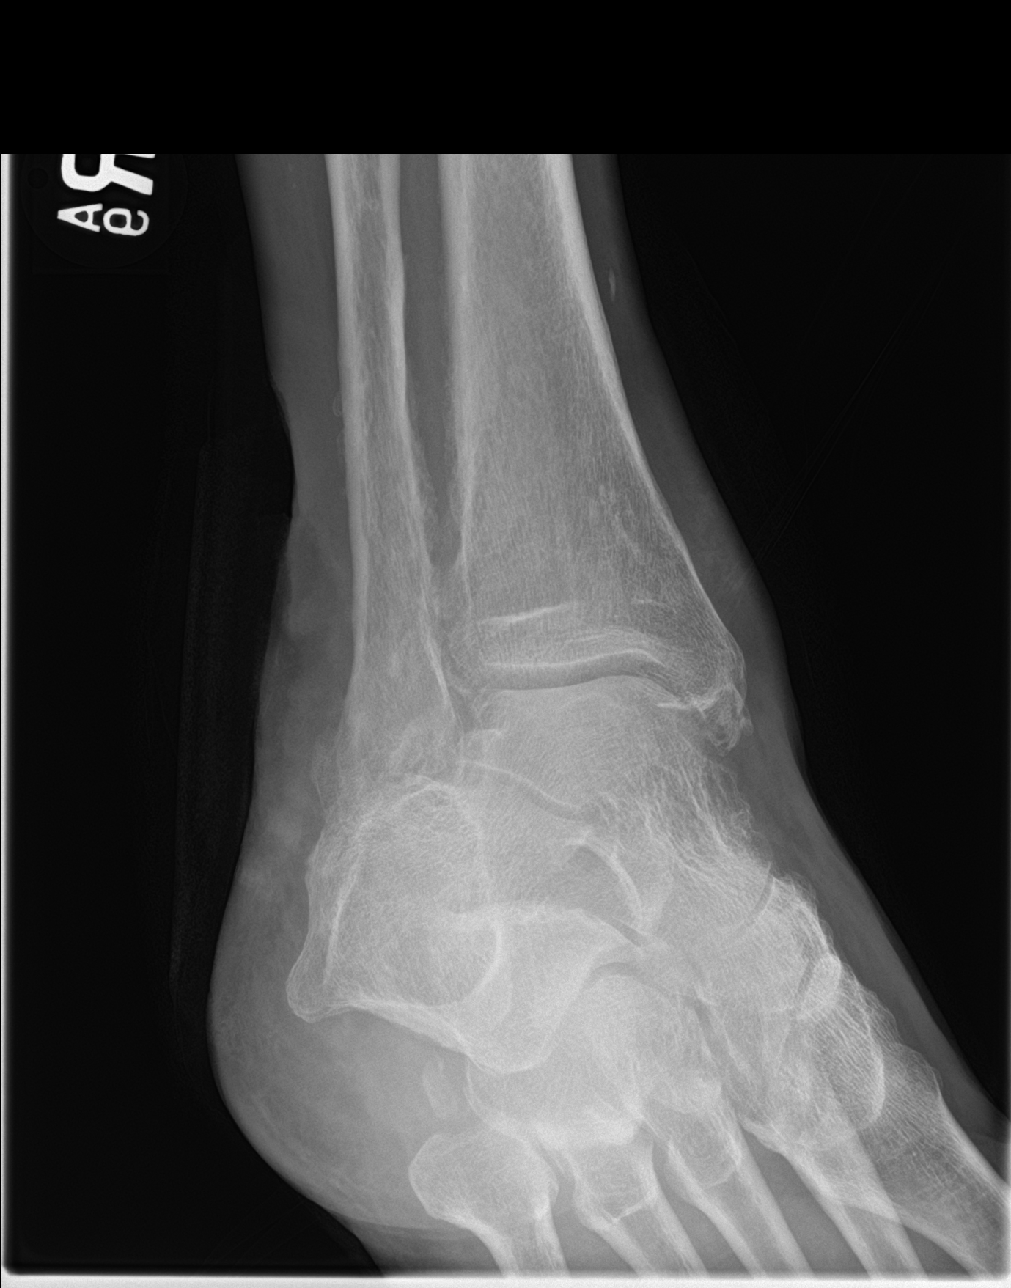
[im 3/3]
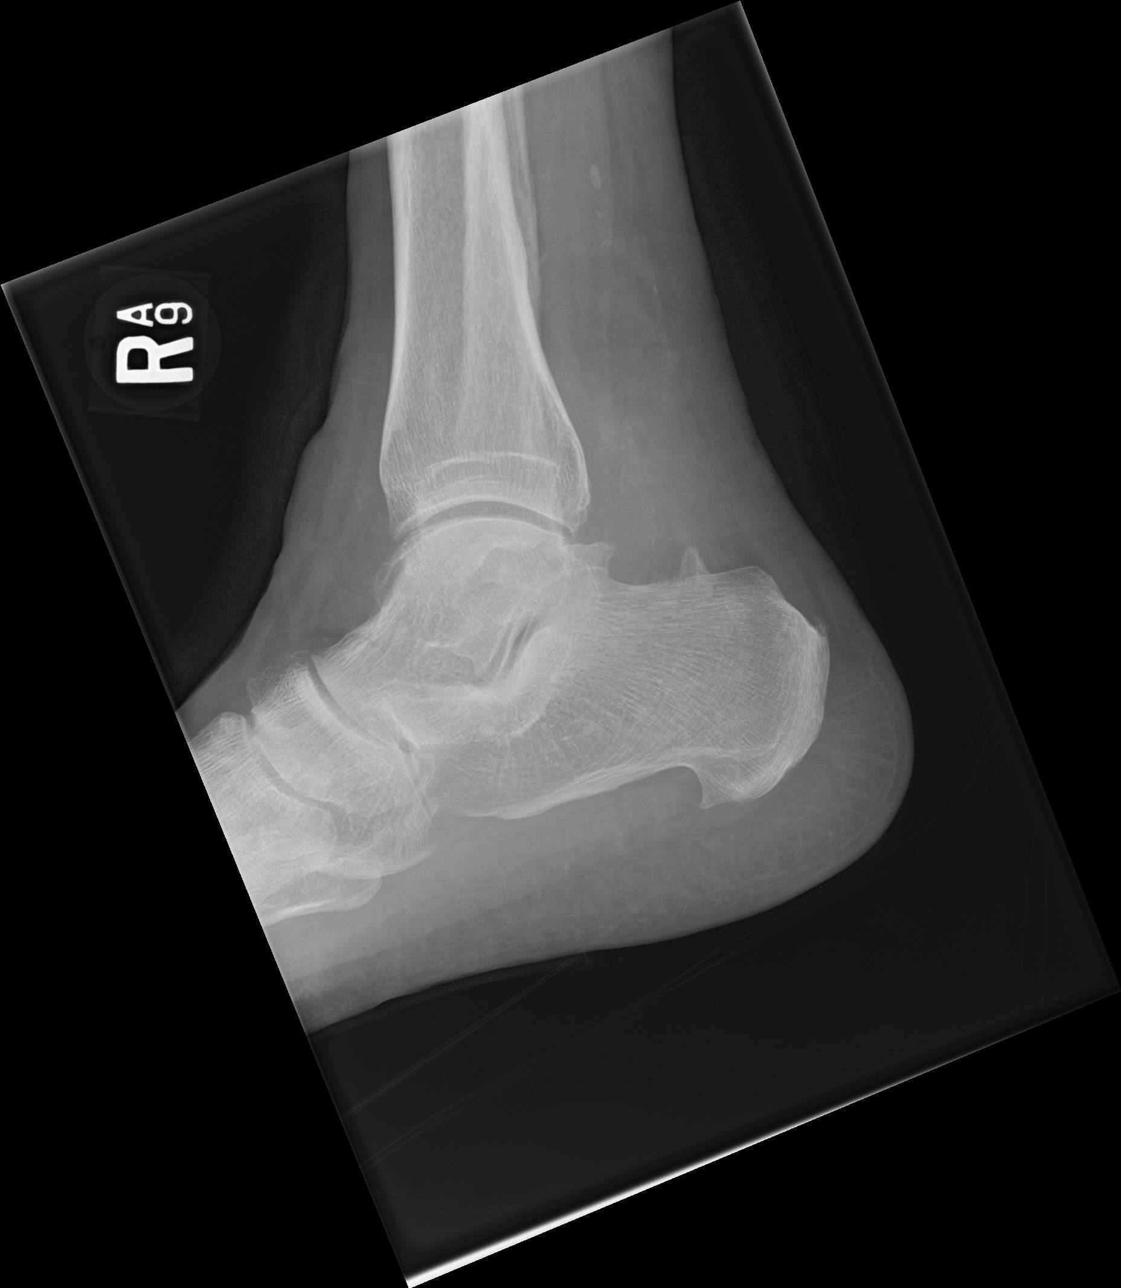

[3 of 3 positions shown; findings below may reference images not displayed]

FINDINGS: Soft tissue bandage noted. Wounds over the soft tissues of the ankle
cannot be excluded. No definite soft tissue air noted. Diffuse soft
tissue swelling is present. Peripheral vascular calcification.
Diffuse degenerative change. No acute or focal bony abnormality .
IMPRESSION: 1. Soft tissue bandage. Soft tissue wound cannot be excluded.
Diffuse soft tissue swelling. Peripheral vascular disease.

2. Diffuse degenerative change. No acute bony abnormality identified
.

## 2017-11-07 NOTE — Progress Notes (Signed)
KAHLEEL, FADELEY ([id number]) Visit Report for 11/05/2017 Arrival Information Details Patient Name: [name], [name]. Date of Service: 11/05/2017 9:00 AM Medical Record Number: [id number] Patient Account Number: [id number] Date of Birth/Sex: [birth year] (72 y.o. Male) Treating RN: [name] Primary Care [name]: [name] Other Clinician: Referring [name]: [name] Treating Ahilyn Nell/Extender: [name], [name] in Treatment: 94 Visit Information History Since Last Visit Added or deleted any medications: No Patient Arrived: Ambulatory Any new allergies or adverse reactions: No Arrival Time: 08:59 Had a fall or experienced change in No Accompanied By: self activities of daily living that may affect Transfer Assistance: None risk of falls: Patient Identification Verified: Yes Signs or symptoms of abuse/neglect since last visito No Secondary Verification Process Yes Hospitalized since last visit: No Completed: Has Dressing in Place as Prescribed: Yes Patient Requires Transmission-Based No Pain Present Now: No Precautions: Patient Has Alerts: Yes Patient Alerts: Patient on Blood Thinner Electronic Signature(s) Signed: 11/05/2017 4:54:11 PM By: [name] Entered By: [name] on 11/05/2017 08:59:48 [name] ([id number]) -------------------------------------------------------------------------------- Encounter Discharge Information Details Patient Name: [name] Date of Service: 11/05/2017 9:00 AM Medical Record Number: [id number] Patient Account Number: [id number] Date of Birth/Sex: 1945/12/12 (72 y.o. Male) Treating RN: [name] Primary Care Pepper Kerrick: [name] Other Clinician: Referring Jennfer Gassen: [name] Treating Para Cossey/Extender: [name] [name] in Treatment: 25 Encounter Discharge Information Items Discharge Pain Level: 0 Discharge Condition: Stable Ambulatory Status: Ambulatory Discharge  Destination: Home Transportation: Private Auto Accompanied By: self Schedule Follow-up Appointment: Yes Medication Reconciliation completed and No provided to Patient/Care Lakrisha Iseman: Patient Clinical Summary of Care: Declined Electronic Signature(s) Signed: 11/06/2017 8:28:46 AM By: [name] Entered By: [name] on 11/05/2017 09:42:55 [name] ([id number]) -------------------------------------------------------------------------------- Lower Extremity Assessment Details Patient Name: [name] Date of Service: 11/05/2017 9:00 AM Medical Record Number: [id number] Patient Account Number: [id number] Date of Birth/Sex: 06-08-1946 (72 y.o. Male) Treating RN: [name] Primary Care Rehmat Murtagh: [name] Other Clinician: Referring [name]: [name] Treating [name]/Extender: [name] [name] in Treatment: 94 Edema Assessment Assessed: [Left: No] [Right: No] Edema: [Left: N] [Right: o] Vascular Assessment Claudication: Claudication Assessment [Right:None] Pulses: Dorsalis Pedis Palpable: [Right:Yes] Posterior Tibial Extremity colors, hair growth, and conditions: Extremity Color: [Right:Hyperpigmented] Hair Growth on Extremity: [Right:No] Temperature of Extremity: [Right:Warm] Capillary Refill: [Right:< 3 seconds] Toe Nail Assessment Left: Right: Thick: Yes Discolored: Yes Deformed: Yes Improper Length and Hygiene: No Electronic Signature(s) Signed: 11/05/2017 4:54:11 PM By: [name] Entered By: [name] on 11/05/2017 09:09:56 [name] ([id number]) -------------------------------------------------------------------------------- Multi Wound Chart Details Patient Name: [name] Date of Service: 11/05/2017 9:00 AM Medical Record Number: [id number] Patient Account Number: [id number] Date of Birth/Sex: Aug 17, 1946 (72 y.o. Male) Treating RN: [name] Primary Care [name]: [name]  Other Clinician: Referring [name]: [name] Treating [name]/Extender: [name] [name] in Treatment: 94 Vital Signs Height(in): 76 Pulse(bpm): 71 Weight(lbs): 238 Blood Pressure(mmHg): 148/68 Body Mass Index(BMI): 29 Temperature(F): 98.3 Respiratory Rate 18 (breaths/min): Photos: [N/A:N/A] Wound Location: Right Malleolus - Lateral Right Malleolus - Medial N/A Wounding Event: Gradually Appeared Gradually Appeared N/A Primary Etiology: Venous Leg Ulcer Venous Leg Ulcer N/A Comorbid History: Cataracts, Chronic Obstructive Cataracts, Chronic Obstructive N/A Pulmonary Disease (COPD), Pulmonary Disease (COPD), Osteoarthritis Osteoarthritis Date Acquired: 08/11/2015 01/30/2016 N/A [name] of Treatment: 94 92 N/A Wound Status: Open Open N/A Clustered Wound: No Yes N/A Measurements L x W x D 6.3x6x0.2 4x3.7x0.1 N/A (cm) Area (cm) : 29.688 11.624 N/A Volume (cm) : 5.938  1.162 N/A % Reduction in Area: -71.80% -0.70% N/A % Reduction in Volume: -14.50% -0.60% N/A Classification: Full Thickness Without Full Thickness Without N/A Exposed Support Structures Exposed Support Structures Exudate Amount: Large Large N/A Exudate Type: Serous Serous N/A Exudate Color: amber amber N/A Wound Margin: Distinct, outline attached Distinct, outline attached N/A Granulation Amount: Small (1-33%) Small (1-33%) N/A Granulation Quality: Red Red N/A Necrotic Amount: Large (67-100%) Large (67-100%) N/A Exposed Structures: Fat Layer (Subcutaneous Fat Layer (Subcutaneous N/A Tissue) Exposed: Yes Tissue) Exposed: Yes Fascia: No Fascia: No Tendon: No Tendon: No [name], [name] Muscle: No Muscle: No Joint: No Joint: No Bone: No Bone: No Epithelialization: None None N/A Periwound Skin Texture: Excoriation: Yes Excoriation: Yes N/A Scarring: Yes Scarring: Yes Induration: No Induration: No Callus: No Callus: No Crepitus: No Crepitus: No Rash: No Rash:  No Periwound Skin Moisture: Maceration: Yes Maceration: Yes N/A Dry/Scaly: No Dry/Scaly: No Periwound Skin Color: Erythema: Yes Erythema: Yes N/A Hemosiderin Staining: Yes Hemosiderin Staining: Yes Atrophie Blanche: No Atrophie Blanche: No Cyanosis: No Cyanosis: No Ecchymosis: No Ecchymosis: No Mottled: No Mottled: No Pallor: No Pallor: No Rubor: No Rubor: No Erythema Location: Circumferential Circumferential N/A Temperature: No Abnormality No Abnormality N/A Tenderness on Palpation: Yes Yes N/A Wound Preparation: Ulcer Cleansing: Ulcer Cleansing: N/A Rinsed/Irrigated with Saline Rinsed/Irrigated with Saline Topical Anesthetic Applied: Topical Anesthetic Applied: Other: lidocaine 4% Other: lidocaine 4% Treatment Notes Electronic Signature(s) Signed: 11/06/2017 8:38:15 AM By: Alejandro Mulling Entered By: Alejandro Mulling on 11/05/2017 09:27:26 [name] ([id number]) -------------------------------------------------------------------------------- Multi-Disciplinary Care Plan Details Patient Name: [name] Date of Service: 11/05/2017 9:00 AM Medical Record Number: [id number] Patient Account Number: [id number] Date of Birth/Sex: October 11, 1945 (72 y.o. Male) Treating RN: [name] Primary Care Matas Burrows: [name] Other Clinician: Referring Lam Mccubbins: [name] Treating Hayleigh Bawa/Extender: [name] [name] in Treatment: 94 Active Inactive ` Venous Leg Ulcer Nursing Diagnoses: Knowledge deficit related to disease process and management Goals: Patient will maintain optimal edema control Date Initiated: 04/03/2016 Target Resolution Date: 11/24/2016 Goal Status: Active Interventions: Compression as ordered Treatment Activities: Therapeutic compression applied : 04/03/2016 Notes: ` Wound/Skin Impairment Nursing Diagnoses: Impaired tissue integrity Goals: Ulcer/skin breakdown will heal within 14 [name] Date Initiated: 01/17/2016 Target  Resolution Date: 11/24/2016 Goal Status: Active Interventions: Assess ulceration(s) every visit Notes: Electronic Signature(s) Signed: 11/06/2017 8:38:15 AM By: Alejandro Mulling Entered By: Alejandro Mulling on 11/05/2017 09:26:51 [name] ([id number]) -------------------------------------------------------------------------------- Pain Assessment Details Patient Name: [name] Date of Service: 11/05/2017 9:00 AM Medical Record Number: [id number] Patient Account Number: [id number] Date of Birth/Sex: [birth year] (72 y.o. Male) Treating RN: [name] Primary Care Dawnya Grams: [name] Other Clinician: Referring Dacie Mandel: [name] Treating Nijah Orlich/Extender: [name] [name] in Treatment: 94 Active Problems Location of Pain Severity and Description of Pain Patient Has Paino No Site Locations Pain Management and Medication Current Pain Management: Electronic Signature(s) Signed: 11/05/2017 4:54:11 PM By: [name] Entered By: [name] on 11/05/2017 08:59:57 [name] ([id number]) -------------------------------------------------------------------------------- Patient/Caregiver Education Details Patient Name: [name] Date of Service: 11/05/2017 9:00 AM Medical Record Number: [id number] Patient Account Number: [id number] Date of Birth/Gender: [birth year] (72 y.o. Male) Treating RN: [name] Primary Care Physician: [name] Other Clinician: Referring Physician: Darreld [name] Treating Physician/Extender: [name], [name] in Treatment: 72 Education Assessment Education Provided To: Patient Education Topics Provided Wound Debridement: Handouts: Wound Debridement Methods: Explain/Verbal Responses: State content correctly Wound/Skin Impairment: Handouts: Caring for Your Ulcer Methods: Explain/Verbal Responses: State content correctly Electronic Signature(s)  Signed: 11/06/2017 8:28:46 AM By: [name] CriglerFlinchum,  Cheryl Entered By: [name] CriglerFlinchum, Cheryl on 11/05/2017 09:43:16 [name] StackBLACKWELL, [name] ([id number]) -------------------------------------------------------------------------------- Wound Assessment Details Patient Name: [name] StackBLACKWELL, [name] Date of Service: 11/05/2017 9:00 AM Medical Record Number: [id number] Patient Account Number: [id number] Date of Birth/Sex: [birth year] [age] y.o. Male) Treating RN: [name] Sitesorthy, Joanna Primary Care Patriciaann Rabanal: [name] McleanMILES, LINDA Other Clinician: Referring Mikailah Morel: [name] McleanMILES, LINDA Treating Thurmon Mizell/Extender: [name] [name] in Treatment: 94 Wound Status Wound Number: 1 Primary Venous Leg Ulcer Etiology: Wound Location: Right Malleolus - Lateral Wound Open Wounding Event: Gradually Appeared Status: Date Acquired: 08/11/2015 Comorbid Cataracts, Chronic Obstructive Pulmonary [name] Of Treatment: 94 History: Disease (COPD), Osteoarthritis Clustered Wound: No Photos Photo Uploaded By: [name] Sitesorthy, Joanna on 11/05/2017 09:13:15 Wound Measurements Length: (cm) 6.3 Width: (cm) 6 Depth: (cm) 0.2 Area: (cm) 29.688 Volume: (cm) 5.938 % Reduction in Area: -71.8% % Reduction in Volume: -14.5% Epithelialization: None Tunneling: No Undermining: No Wound Description Full Thickness Without Exposed Support Classification: Structures Wound Margin: Distinct, outline attached Exudate Large Amount: Exudate Type: Serous Exudate Color: amber Foul Odor After Cleansing: No Slough/Fibrino Yes Wound Bed Granulation Amount: Small (1-33%) Exposed Structure Granulation Quality: Red Fascia Exposed: No Necrotic Amount: Large (67-100%) Fat Layer (Subcutaneous Tissue) Exposed: Yes Necrotic Quality: Adherent Slough Tendon Exposed: No Muscle Exposed: No Joint Exposed: No Bone Exposed: No Vorhees, Davy C. ([id number]) Periwound Skin Texture Texture Color No Abnormalities Noted: No No Abnormalities Noted: No Callus: No Atrophie Blanche: No Crepitus: No Cyanosis:  No Excoriation: Yes Ecchymosis: No Induration: No Erythema: Yes Rash: No Erythema Location: Circumferential Scarring: Yes Hemosiderin Staining: Yes Mottled: No Moisture Pallor: No No Abnormalities Noted: No Rubor: No Dry / Scaly: No Maceration: Yes Temperature / Pain Temperature: No Abnormality Tenderness on Palpation: Yes Wound Preparation Ulcer Cleansing: Rinsed/Irrigated with Saline Topical Anesthetic Applied: Other: lidocaine 4%, Treatment Notes Wound #1 (Right, Lateral Malleolus) 1. Cleansed with: Clean wound with Normal Saline 2. Anesthetic Topical Lidocaine 4% cream to wound bed prior to debridement 4. Dressing Applied: Hydrogel Prisma Ag 5. Secondary Dressing Applied ABD Pad Notes xtrasorb, kerlix and offloading shoe Electronic Signature(s) Signed: 11/05/2017 4:54:11 PM By: [name] Sitesorthy, Joanna Entered By: [name] Sitesorthy, Joanna on 11/05/2017 09:08:16 [name] StackBLACKWELL, [name] ([id number]) -------------------------------------------------------------------------------- Wound Assessment Details Patient Name: [name] StackBLACKWELL, [name] Date of Service: 11/05/2017 9:00 AM Medical Record Number: [id number] Patient Account Number: [id number] Date of Birth/Sex: [birth year] [age] y.o. Male) Treating RN: [name] Sitesorthy, Joanna Primary Care Nevaeha Finerty: [name] McleanMILES, LINDA Other Clinician: Referring Bekka Qian: [name] McleanMILES, LINDA Treating Shemeka Wardle/Extender: [name] [name] in Treatment: 94 Wound Status Wound Number: 2 Primary Venous Leg Ulcer Etiology: Wound Location: Right Malleolus - Medial Wound Open Wounding Event: Gradually Appeared Status: Date Acquired: 01/30/2016 Comorbid Cataracts, Chronic Obstructive Pulmonary [name] Of Treatment: 92 History: Disease (COPD), Osteoarthritis Clustered Wound: Yes Photos Photo Uploaded By: [name] Sitesorthy, Joanna on 11/05/2017 09:13:16 Wound Measurements Length: (cm) 4 Width: (cm) 3.7 Depth: (cm) 0.1 Area: (cm) 11.624 Volume: (cm) 1.162 % Reduction in Area: -0.7% %  Reduction in Volume: -0.6% Epithelialization: None Tunneling: No Undermining: No Wound Description Full Thickness Without Exposed Support Classification: Structures Wound Margin: Distinct, outline attached Exudate Large Amount: Exudate Type: Serous Exudate Color: amber Foul Odor After Cleansing: No Slough/Fibrino Yes Wound Bed Granulation Amount: Small (1-33%) Exposed Structure Granulation Quality: Red Fascia Exposed: No Necrotic Amount: Large (67-100%) Fat Layer (Subcutaneous Tissue) Exposed: Yes Necrotic Quality: Adherent Slough Tendon Exposed: No Muscle Exposed: No Joint Exposed: No Bone Exposed: No [name], [name] ([id number]) Periwound Skin Texture Texture Color No Abnormalities Noted: No No  Abnormalities Noted: No Callus: No Atrophie Blanche: No Crepitus: No Cyanosis: No Excoriation: Yes Ecchymosis: No Induration: No Erythema: Yes Rash: No Erythema Location: Circumferential Scarring: Yes Hemosiderin Staining: Yes Mottled: No Moisture Pallor: No No Abnormalities Noted: No Rubor: No Dry / Scaly: No Maceration: Yes Temperature / Pain Temperature: No Abnormality Tenderness on Palpation: Yes Wound Preparation Ulcer Cleansing: Rinsed/Irrigated with Saline Topical Anesthetic Applied: Other: lidocaine 4%, Treatment Notes Wound #2 (Right, Medial Malleolus) 1. Cleansed with: Clean wound with Normal Saline 2. Anesthetic Topical Lidocaine 4% cream to wound bed prior to debridement 4. Dressing Applied: Hydrogel Prisma Ag 5. Secondary Dressing Applied ABD Pad Notes xtrasorb, kerlix and offloading shoe Electronic Signature(s) Signed: 11/05/2017 4:54:11 PM By: [name] Entered By: [name] on 11/05/2017 09:09:19 [name] ([id number]) -------------------------------------------------------------------------------- Vitals Details Patient Name: [name] Date of Service: 11/05/2017 9:00 AM Medical Record Number:  [id number] Patient Account Number: [id number] Date of Birth/Sex: June 13, 1946 (72 y.o. Male) Treating RN: [name] Primary Care Hailyn Zarr: [name] Other Clinician: Referring Buel Molder: [name] Treating Fabiha Rougeau/Extender: [name] [name] in Treatment: 94 Vital Signs Time Taken: 09:00 Temperature (F): 98.3 Height (in): 76 Pulse (bpm): 71 Weight (lbs): 238 Respiratory Rate (breaths/min): 18 Body Mass Index (BMI): 29 Blood Pressure (mmHg): 148/68 Reference Range: 80 - 120 mg / dl Electronic Signature(s) Signed: 11/05/2017 4:54:11 PM By: [name] Entered By: [name] on 11/05/2017 09:02:50

## 2017-11-07 NOTE — Progress Notes (Signed)
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 amounts of surface slough. Started him on Iodoflex last week  he is completing another course of Cefdinir or which I think was done empirically. His arterial study showed ABIs were 1.1 on the right 1.5 on the left. He did have a slightly reduced ABI in the right the left one was not obtained. Had calcification of the right posterior tibial artery. The interpretation was no segmental stenosis. His waveforms were triphasic. TAYJON, HALLADAY ([id number]) His reflux studies are later this month. Depending on this  I'll send him for a vascular consultation, he may need to see plastic surgery as I believe he is had plastic surgery on this foot in the past. He had an injury to the foot in the 1980s. 1/16 /18 right lateral greater than right medial ankle wounds on the right in the setting of previous skin grafting. 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 He does not have arterial issues. Each week he comes in with the same adherent surface slough although there was less of this today 09/18/16; right lateral greater than right medial lower extremity wounds in the setting of previous skin grafting and trauma. He has least to vein laser ablation scheduled for February 2 for venous reflux. He does not have significant arterial disease. Problem has been very difficult to handle surface slough/necrotic tissue. Recently using Iodoflex for this with some, albeit slow improvement 09/25/16; right lateral greater than right medial lower extremity venous wounds in the setting of previous skin grafting. He is going for ablation surgery on February 2 after this he'll come back here for rewrap. He has been using Iodoflex as the primary dressing. 10/02/16; right lateral greater than right medial lower extremity wounds in the setting of previous skin grafting. He had his ablation surgery last week, I don't have a report. He tolerated this well. Came in with a thigh-high Unna boots on Friday. We have  been using Iodoflex as the primary dressing. His measurements are improving 10/09/16; continues to make nice aggressive in terms of the wounds on his lateral and medial right ankle in the setting of previous skin grafting. Yesterday he noticed drainage at one of his surgical sites from his venous ablation on the right calf. He took off the bandage over this area felt a "popping" sensation and a reddish-brown drainage. He is not complaining of any pain 10/16/16; he continues to make nice progression in terms of the wounds on the lateral and medial malleolus. Both smaller using Iodoflex. He had a surgical area in his posterior mid calf we have been using iodoform. All the wounds are down and dimensions 10/23/16; the patient arrives today with no complaints. He states the Iodoflex is a bit uncomfortable. He is not systemically unwell. We have been using Iodoflex to the lateral right ankle and the medial and Aquacel Ag to the reflux surgical wound on the posterior right calf. All of these wounds are doing well 10/30/16; patient states he has no pain no systemic symptoms. I changed him to [facility] last week. Although the wounds are doing well 11/06/16; patient reports no pain or systemic symptoms. We continue with Hydrofera Blue. Both wound areas on the medial and lateral ankle appear to be doing well with improvement and dimensions and improvement in the wound bed. 11/13/16; patient's dimensions continued to improve. We continue with Hydrofera Blue on the medial and lateral side. Appear to be doing well with healthy granulation and advancing epithelialization 11/20/16; patient's dimensions improving laterally by about half a centimeter in length. Otherwise no change on the medial side. Using [facility] 12/04/16; no major change in patient's wound dimensions. Intake nurse reports more drainage. 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 He is voicing no complaints or concerns. He has been tolerating his twice weekly compression therapy changes 12/11/16 Patient complains of  pain and increased drainage.. wants hydrofera blue 12/18/16 improvement. Sorbact 12/25/16; medial wound is smaller, lateral measures the same. Still on sorbact 01/01/17; medial wound continues to be smaller, lateral measures about the same however there is clearly advancing epithelialization here as well in fact I think the wound will ultimately divided into 2 open areas 01/08/17; unfortunately today fairly significant regression in several areas. Surface of the lateral wound covered again in adherent necrotic material which is difficult to debridement. He has significant surrounding skin maceration. The expanding area of tissue epithelialization in the middle of the wound that was encouraging last week appears to be smaller. There is no surrounding tenderness. The area on the medial leg also did not seem to be as healthy as last week, the reason for this regression this week is not totally clear. We have been using Sorbact for the last 4 weeks. We'll switched of polymen AG which we will order via home medical supply. If there is a problem with this would switch back to Iodoflex 01/15/18; drainage,odor. No change. Switched to polymen last week 01/22/17; still continuous drainage. Culture I did last week showed a few Proteus pansensitive. I did this culture because of drainage. Put him on Augmentin which she has been taking since Saturday however he is developed 4-5 liquid bowel movements. He is also on Coumadin. Beyond this wound is not changed at all, still nonviable necrotic surface material which I debrided reveals healthy granulation line 01/29/17; still copious amounts of drainage reported by her intake nurse. Wound measuring slightly smaller. Currently fact on Iodoflex although I'm looking forward to changing back to  perhaps [facility] or polymen AG 02/05/17; still large amounts of drainage and presenting with really large amounts of adherent slough and necrotic material over the remaining open area of the wound. We have been using Iodoflex but with little improvement in the surface. Change to [facility] 02/12/17; still large amount of drainage. Much less adherent slough however. Started [employer]	[place] last week JAROME, TRULL ([id number]) 02/19/17; drainage is better this week. Much less adherent slough. Perhaps some improvement in dimensions. Using [facility] 02/26/17; severe venous insufficiency wounds on the right lateral and right medial leg. Drainage is some better and slough is less adherent we've been using Hydrofera Blue 03/05/17 on evaluation today patient appears to be doing well. His wounds have been decreasing in size and overall he is pleased with how this is progressing. We are awaiting approval for the epigraph which has previously been recommended in the meantime the [facility] Dressing is to be doing very well for him. 03/12/17; wound dimensions are smaller still using Hydrofera Blue. Comes in on Fridays for a dressing change. 03/19/17; wound dimensions continued to contract. Healing of this wound is complicated by continuous significant drainage as well as recurrent buildup of necrotic surface material. We looked into Apligraf and he has a $290 co-pay per application but truthfully I think the drainage as well as the nonviable surface would preclude use of Apligraf there are any other skin substitute at this point therefore the continued plan will be debridement each clinic visit, 2 times a week dressing changes and continued use of Hydrofera Blue. improvement has been very slow but sustained 03/26/17 perhaps slight improvements in peripheral epithelialization is especially inferiorly. Still with large amount of drainage and tightly adherent necrotic surface on arrival. Along  with the intake nurse I reviewed previous treatment. He worsened on Iodoflex and had a 4 week trial of sorbact, Polymen  AG and a long courses of Aquacel Ag. He is not a candidate for advanced treatment options for many reasons 04/09/17 on evaluation today patient's right lower extremity wounds appear to be doing a little better. Fortunately he has no significant discomfort and has been tolerating the dressing changes including the wraps without complication. With that being said he really does not have swelling anymore compared to what he has had in the past following his vascular intervention. He wonders if potentially we could attempt avoiding the rats to see if he could cleanse the wound in between to try to prevent some of the fiber and its buildup that occurs in the interim between when we see him week to week. No fevers, chills, nausea, or vomiting noted at this time. 04/16/17; noted that the staff made a choice last week not to put him in compression. The patient is changing his dressing at home using [facility] and changing every second day. His wife is washing the wound with saline. He is using kerlix. Surprisingly he has not developed a lot of edema. This choice was made because of the degree of fluid retention and maceration even with changing his dressing twice a week 04/23/17; absolutely no change. Using Hydrofera Blue. Recurrent tightly adherent nonviable surface material. This is been refractory to Iodoflex, sorbact and now Hydrofera Blue. More recently he has been changing his own dressings at home, cleansing the wound in the shower. He has not developed lower extremity edema 04/30/17; if anything the wound is larger still in adherent surface although it debrided easily today. I've been using Mehta honey for 1 week and I'd like to try and do it for a second week this see if we can get some form of viable surface here. 05/07/17; patient arrives with copious amounts of drainage and some  pain in the superior part of the wound. He has not been systemically unwell. He's been changing this daily at home 05/14/17; patient arrives today complaining of less drainage and less pain. Dimensions slightly better. Surface culture I did of this last week grew MSSA I put him on dicloxacillin for 10 days. Patient requesting a further prescription since he feels so much better. He did not obtain the [facility] AG for reasons that aren't clear, he has been using Hydrofera Blue 05/21/17; perhaps some less drainage and less pain. He is completing another week of doxycycline. Unfortunately there is no change in the wound measurements are appearance still tightly adherent necrotic surface material that is really defied treatment. We have been using Hydrofera Blue most recently. He did not manage to get Anasept. He was told it was prescription at [facility] 05/29/17; absolutely no improvement in these wound areas. He had remote plastic surgery with who was done at [facility] in [place] surrounding this area. This was a traumatic wound with extensive plastic surgery/skin grafting at that time. I switched him to sorbact last week to see if he can do anything about the recurrent necrotic surface and drainage. This is largely been refractory to Iodoflex/Hydrofera Blue/alginates. I believe we tried Elby Showers and more recently Medi honey l however the drainage is really too excessive 06/04/17; patient went to see Dr. Ulice Bold. Per the patient she ordered him a compression stocking. Continued the Anasept gel and sorbact was already on. No major improvement in the condition of the wounds in fact the medial wound looks larger. Again per the patient she did not feel a skin graft or operative debridement was indicated The patient had  previous venous ablation in February 2018. Last arterial studies were in December 2017 and were within normal limits 06/18/17; patient arrives back in clinic today using Anasept gel  with sorbact. He changes this every day is wearing his own compression stocking. The patient actually saw Dr. Leta Baptist of plastic surgery. I've reviewed her note. The appointment was on 05/30/17. The basic issue with here was that she did not feel that skin grafts for patients with underlying stasis and edema have a good long-term success rate. She also discuss skin substitutes which has been done here as well however I have not been able to get the surface of these wounds to something that I think would support skin substitutes. This is why he felt he might need an TEX, CONROY. ([id number]) operative debridement more aggressively than I can do in this clinic Review of systems; is otherwise negative he feels well 07/02/17; patient has been using Anasept gel with Sorbact. He changes this every day scrubs out the wound beds. Arrives today with the wound bed looking some better. Easier debridement 07/16/17; patient has been using Anasept gel was sorbact for about a month now. The necrotic service on his wound bed is better and the drainage is now down to minimal but we've not made any improvements in the epithelialization. Change to Santyl today. Surface of the wound is still not good enough to support skin substitutes 07/30/17 on evaluation today patient appears to be doing very well in regard to his right bimalleolus wounds. He has been tolerating the treatment with the Anasept gel and sorbact. However we were going to try and switch to Santyl to be more aggressive with these wounds. This was however cost prohibitive. Therefore we will likely go back to the sorbact. Nonetheless he is not having any significant discomfort he still is forming slough over the wound location but nothing too significant. The wounds do appear to be filling in nicely. 08/13/17; I have not seen this wound in about a month. There is not much change. The fact the area medially looks larger. He has been using sorbact  and Anasept for over a month now without much effect. Arrives in clinic stating that he does not want the area debrided 08/28/17; patient arrives with the areas on his right medial and right lateral ankle worse. The wounds of expanded there is erythema and still drainage. There is no pain and no tenderness. We had been using Keracel AG clearly not doing well. 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 He states that even the evening that we began utilizing the Iodoflex he started having burning. Subsequently this never really improved and he eventually discontinue the use of the Iodoflex altogether. Unfortunately he did not let us know about any of this until today. I'm unsure of any specific infection at this point although I am going to obtain a wound culture today in order to see if there's anything that could potentially be causing an infection as well. It does sound as if he's had the Iodoflex before and did not have this kind of reaction although this does sound very specific for a reaction to the Iodoflex. 09/17/17 on evaluation today patient appears to be doing significantly better  compared to last week's evaluation. At that time it actually appears that he did have an infection the good news is that we have been able to get this under control with switching to the [facility] solution he's not having as much pain and discomfort he still does have some erythema surrounding the medial aspect wound. He has been tolerating the Dakin's soaked gauze dressing which is excellent and that his wounds do not seem to have as much adherent slough. Overall I'm pleased with the progress she's made in last week. 11/05/17 on evaluation today patient  appears to be doing better in regard to his right medial and lateral malleolus ulcers. He has been tolerating the dressing changes without complication. I do flight the Freada Bergeron is helping with additional granulation at this point in the wound beds do appear to be a little bit better. With that being said patient does not appear to have any evidence of significant infection at this point there is no erythema as he has previously noted he does note that the 30 day course of antibiotics I placed him on will end tomorrow. Patient History Information obtained from Patient. Social History Former smoker - 10 years ago, Marital Status - Married, Alcohol Use - Moderate, Drug Use - No History, Caffeine Use - Never. Medical And Surgical History Notes Constitutional Symptoms (General Health) Vein Filter (groin area); CODA; H/O Blood Clots; pulmonary hypertensive arterial disease Review of Systems (ROS) Constitutional Symptoms (General Health) Denies complaints or symptoms of Fever, Chills. Respiratory The patient has no complaints or symptoms. Cardiovascular The patient has no complaints or symptoms. JULIA, KULZER ([id number]) Psychiatric The patient has no complaints or symptoms. Objective Constitutional Well-nourished and well-hydrated in no acute distress. Vitals Time Taken: 9:00 AM, Height: 76 in, Weight: 238 lbs, BMI: 29, Temperature: 98.3 F, Pulse: 71 bpm, Respiratory Rate: 18 breaths/min, Blood Pressure: 148/68 mmHg. Respiratory normal breathing without difficulty. Psychiatric this patient is able to make decisions and demonstrates good insight into disease process. Alert and Oriented x 3. pleasant and cooperative. General Notes: Patient's wounds do have slough covering at this point but fortunately there does not appear to be infection. The slough was easily removed and I was able to sharply debride this with excellent results and post debridement the wound beds appear to be  clean and clear. There was additional granulation noted on the lateral malleolus in particular no maceration. Overall I'm very pleased with how things have progressed. Integumentary (Hair, Skin) Wound #1 status is Open. Original cause of wound was Gradually Appeared. The wound is located on the Right,Lateral Malleolus. The wound measures 6.3cm length x 6cm width x 0.2cm depth; 29.688cm^2 area and 5.938cm^3 volume. There is Fat Layer (Subcutaneous Tissue) Exposed exposed. There is no tunneling or undermining noted. There is a large amount of serous drainage noted. The wound margin is distinct with the outline attached to the wound base. There is small (1-33%) red granulation within the wound bed. There is a large (67-100%) amount of necrotic tissue within the wound bed including Adherent Slough. The periwound skin appearance exhibited: Excoriation, Scarring, Maceration, Hemosiderin Staining, Erythema. The periwound skin appearance did not exhibit: Callus, Crepitus, Induration, Rash, Dry/Scaly, Atrophie Blanche, Cyanosis, Ecchymosis, Mottled, Pallor, Rubor. The surrounding wound skin color is noted with erythema which is circumferential. Periwound temperature was noted as No Abnormality. The periwound has tenderness on palpation. Wound #2 status is Open. Original cause of wound was Gradually Appeared. The wound is located on the Right,Medial Malleolus. The wound  measures 4cm length x 3.7cm width x 0.1cm depth; 11.624cm^2 area and 1.162cm^3 volume. There is Fat Layer (Subcutaneous Tissue) Exposed exposed. There is no tunneling or undermining noted. There is a large amount of serous drainage noted. The wound margin is distinct with the outline attached to the wound base. There is small (1-33%) red granulation within the wound bed. There is a large (67-100%) amount of necrotic tissue within the wound bed including Adherent Slough. The periwound skin appearance exhibited: Excoriation, Scarring,  Maceration, Hemosiderin Staining, Erythema. The periwound skin appearance did not exhibit: Callus, Crepitus, Induration, Rash, Dry/Scaly, Atrophie Blanche, Cyanosis, Ecchymosis, Mottled, Pallor, Rubor. The surrounding wound skin color is noted with erythema which is circumferential. Periwound temperature was noted as No Abnormality. The periwound has tenderness on palpation. 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 There was a Skin/Subcutaneous Tissue Debridement ([id number]) debridement with total area of 37.8 sq cm performed by STONE III, HOYT E., PA-C. with the following instrument(s): Curette to remove Viable and Non-Viable tissue/material including Exudate, Fibrin/Slough, and Subcutaneous after achieving pain control using Lidocaine 4% Topical Solution. A time out was conducted at 09:28, prior to the start of the procedure. A Minimum amount of bleeding was controlled with Pressure. The procedure was tolerated well with a pain level of 0 throughout and a pain level of 0 following the procedure. Post Debridement Measurements: 6.3cm length x 6cm width x 0.3cm depth; 8.906cm^3 volume. Character of Wound/Ulcer Post Debridement requires further debridement.  Severity of Tissue Post Debridement is: Fat layer exposed. 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 There was a Skin/Subcutaneous Tissue Debridement ([id number]) debridement with total area of 14.8 sq cm performed by STONE III, HOYT E., PA-C. with the following instrument(s): Curette to remove Viable and Non-Viable tissue/material including Exudate, Fibrin/Slough, and Subcutaneous after achieving pain control using Lidocaine 4% Topical Solution. A time out was conducted at 09:28, prior to the start of the procedure. A Minimum amount of bleeding was controlled with Pressure. The procedure was tolerated well with a pain level of 0 throughout and a pain level of 0 following the procedure. Post Debridement Measurements: 4cm length x 3.7cm width x 0.2cm depth; 2.325cm^3 volume. Character of Wound/Ulcer Post Debridement requires further debridement. Severity of Tissue Post Debridement is: Fat layer exposed. Post procedure Diagnosis Wound #2: Same as Pre-Procedure Plan Wound Cleansing: Wound #1 Right,Lateral Malleolus: Clean wound with Normal Saline. Cleanse wound with mild soap and water May Shower, gently pat wound dry prior to applying new dressing. Wound #2 Right,Medial Malleolus: Clean wound with Normal Saline. TAKEEM, KROTZER ([id number]) Cleanse wound with mild soap and water May Shower, gently pat wound dry prior to applying new dressing. Anesthetic (add to Medication List): Wound #1 Right,Lateral Malleolus:  Topical Lidocaine 4% cream applied to wound bed prior to debridement (In Clinic Only). Wound #2 Right,Medial Malleolus: Topical Lidocaine 4% cream applied to wound bed prior to debridement (In Clinic Only). Skin Barriers/Peri-Wound Care: Wound #1 Right,Lateral Malleolus: Barrier cream Wound #2  Right,Medial Malleolus: Barrier cream Primary Wound Dressing: Wound #1 Right,Lateral Malleolus: Hydrogel Prisma Ag Wound #2 Right,Medial Malleolus: Hydrogel Prisma Ag Secondary Dressing: Wound #1 Right,Lateral Malleolus: ABD pad Dry Gauze Conform/Kerlix XtraSorb Wound #2 Right,Medial Malleolus: ABD pad Dry Gauze Conform/Kerlix XtraSorb Dressing Change Frequency: Wound #1 Right,Lateral Malleolus: Change dressing every day. Wound #2 Right,Medial Malleolus: Change dressing every day. Follow-up Appointments: Wound #1 Right,Lateral Malleolus: Return Appointment in 1 week. Wound #2 Right,Medial Malleolus: Return Appointment in 1 week. Edema Control: Wound #1 Right,Lateral Malleolus: Elevate legs to the level of the heart and pump ankles as often as possible Wound #2 Right,Medial Malleolus: Elevate legs to the level of the heart and pump ankles as often as possible Additional Orders / Instructions: Wound #1 Right,Lateral Malleolus: Increase protein intake. Wound #2 Right,Medial Malleolus: Increase protein intake. Medications-please add to medication list.: Wound #1 Right,Lateral Malleolus: Other: - Vitamin C, Zinc, MVI Wound #2 Right,Medial Malleolus: Other: - Vitamin C, Zinc, MVI The following medication(s) was prescribed: lidocaine topical 4 % cream 1 1 cream topical was prescribed at facility [facility]. ([id number]) I am going to recommend that we continue with the Prisma for the next week. Patient is in agreement with plan. We will see were things stand following. I did advise patient that I do not want him on another round of antibiotics at this point since everything seems to have cleared up there is no erythema I'm hopeful that this has taken care of the issue at this point. If not and if you continues to have additional issues we may need to consider a referral to Dr. Sampson Goon, infectious disease. We will see how things go. Please see above for specific  wound care orders. We will see patient for re-evaluation in 1 week(s) here in the clinic. If anything worsens or changes patient will contact our office for additional recommendations. Electronic Signature(s) Signed: 11/06/2017 12:09:36 AM By: Lenda Kelp PA-C Entered By: Lenda Kelp on 11/05/2017 09:41:12 [name] ([id number]) -------------------------------------------------------------------------------- ROS/PFSH Details Patient Name: [name] Date of Service: 11/05/2017 9:00 AM Medical Record Number: [id number] Patient Account Number: [id number] Date of Birth/Sex: 02-Aug-1946 (72 y.o. Male) Treating RN: Ashok Cordia, [name] Primary Care Provider: [name] Other Clinician: Referring Provider: [name] Treating Provider/Extender: STONE III, HOYT Weeks in Treatment: 2 Information Obtained From Patient Wound History Do you currently have one or more open woundso Yes How many open wounds do you currently haveo 1 Approximately how long have you had your woundso 5 months How have you been treating your wound(s) until nowo saline, dressing Has your wound(s) ever healed and then re-openedo Yes Have you had any lab work done in the past montho No Have you tested positive for an antibiotic resistant organism (MRSA, VRE)o No Have you tested positive for osteomyelitis (bone infection)o No Have you had any tests for circulation on your legso No Constitutional Symptoms (General Health) Complaints and Symptoms: Negative for: Fever; Chills Medical History: Past Medical History Notes: Vein Filter (groin area); CODA; H/O Blood Clots; pulmonary hypertensive arterial disease Eyes Medical History: Positive for: Cataracts - removed Negative for: Glaucoma; Optic Neuritis Ear/Nose/Mouth/Throat Medical History: Negative for: Chronic sinus problems/congestion Hematologic/Lymphatic Medical History: Negative for: Anemia; Hemophilia; Human Immunodeficiency Virus;  Lymphedema; Sickle Cell Disease Respiratory Complaints and Symptoms: No Complaints or Symptoms Medical History: Positive for: Chronic Obstructive Pulmonary Disease (COPD) Negative for: Aspiration; Asthma; Pneumothorax; Sleep Apnea; Tuberculosis Cardiovascular TAVIEN, CHESTNUT. ([id number]) Complaints and Symptoms: No Complaints or Symptoms Medical History: Negative for: Angina; Arrhythmia; Congestive Heart Failure; Coronary Artery Disease; Deep Vein Thrombosis; Hypertension; Hypotension; Myocardial Infarction; Peripheral Arterial Disease; Peripheral Venous Disease; Phlebitis; Vasculitis Gastrointestinal Medical History: Negative for: Cirrhosis ; Colitis; Crohnos; Hepatitis A; Hepatitis B; Hepatitis C Endocrine Medical History: Negative for: Type I Diabetes; Type II Diabetes Genitourinary Medical History: Negative for: End Stage Renal Disease Immunological Medical History: Negative for: Lupus Erythematosus; Raynaudos Integumentary (Skin) Medical History: Negative for: History of Burn; History of pressure wounds Musculoskeletal Medical History: Positive for: Osteoarthritis Negative for: Gout; Rheumatoid Arthritis; Osteomyelitis Neurologic Medical History: Negative for: Dementia; Neuropathy; Quadriplegia; Paraplegia; Seizure Disorder Oncologic Medical History: Negative for: Received Chemotherapy; Received Radiation Psychiatric Complaints and Symptoms: No Complaints or Symptoms Medical History: Negative for: Anorexia/bulimia; Confinement Anxiety HBO Extended History Items TYREON, FRIGON. ([id number]) Eyes: Cataracts Immunizations Pneumococcal Vaccine: Received Pneumococcal Vaccination: No Implantable Devices Family and Social History Former smoker - 10 years ago; Marital Status - Married; Alcohol Use: Moderate; Drug Use: No History; Caffeine Use: Never; Advanced Directives: No; Patient does not want information on Advanced Directives; Living Will: No; Medical  Power of Attorney: No Physician Affirmation I have reviewed and agree with the above information. Electronic Signature(s) Signed: 11/06/2017 12:09:36 AM By: Lenda Kelp PA-C Signed: 11/06/2017 8:38:15 AM By: Alejandro Mulling Entered By: Lenda Kelp on 11/05/2017 09:39:53 [name] ([id number]) -------------------------------------------------------------------------------- SuperBill Details Patient Name: [name] Date of Service: 11/05/2017 Medical Record Number: [id number] Patient Account Number: [id number] Date of Birth/Sex: November 20, 1945 (72 y.o. 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, initial encounter T81.31XD Disruption of external operation (surgical) wound, not elsewhere classified, subsequent encounter L97.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 of skin Facility Procedures CPT4 Code: 62952841 Description: 11042 - DEB SUBQ TISSUE 20 SQ CM/< ICD-10 Diagnosis Description L97.212 Non-pressure chronic ulcer of right calf with fat layer expos Modifier: ed Quantity: 1 CPT4 Code: 32440102 Description: 11045 - DEB SUBQ TISS EA ADDL 20CM ICD-10 Diagnosis Description L97.212 Non-pressure chronic ulcer of right calf with fat layer expos Modifier: ed Quantity: 2 Physician Procedures CPT4 Code: 7253664 Description: 11042 - WC PHYS SUBQ TISS 20 SQ CM ICD-10 Diagnosis Description L97.212 Non-pressure chronic ulcer of  right calf with fat layer expose Modifier: d Quantity: 1 CPT4 Code: 4034742 Description: 11045 - WC PHYS SUBQ TISS EA ADDL 20 CM ICD-10 Diagnosis Description L97.212 Non-pressure chronic ulcer of right calf with fat layer expose Modifier: d Quantity: 2 Electronic Signature(s) Signed: 11/06/2017 12:09:36 AM By: Lenda Kelp PA-C Entered By: Lenda Kelp on 11/05/2017 09:41:38

## 2017-11-12 ENCOUNTER — Encounter: Payer: Medicare HMO | Admitting: Physician Assistant

## 2017-11-12 DIAGNOSIS — I87331 Chronic venous hypertension (idiopathic) with ulcer and inflammation of right lower extremity: Secondary | ICD-10-CM | POA: Diagnosis not present

## 2017-11-17 NOTE — Progress Notes (Signed)
RUSHI, CHASEN ([id number]) Visit Report for 11/12/2017 Arrival Information Details Patient Name: [name], [name]. Date of Service: 11/12/2017 8:15 AM Medical Record Number: [id number] Patient Account Number: [id number] Date of Birth/Sex: [birth year] (72 y.o. M) Treating RN: Huel Coventry Primary Care Sumiye Hirth: [name] Other Clinician: Referring Durward Matranga: [name] Treating Sherry Rogus/Extender: Linwood Dibbles, HOYT Weeks in Treatment: 95 Visit Information History Since Last Visit Added or deleted any medications: No Patient Arrived: Ambulatory Any new allergies or adverse reactions: No Arrival Time: 08:18 Had a fall or experienced change in No Accompanied By: self activities of daily living that may affect Transfer Assistance: None risk of falls: Patient Identification Verified: Yes Signs or symptoms of abuse/neglect since last No Secondary Verification Process Yes visito Completed: Hospitalized since last visit: No Patient Requires Transmission-Based No Has Dressing in Place as Prescribed: Yes Precautions: Has Footwear/Offloading in Place as Yes Patient Has Alerts: Yes Prescribed: Patient Alerts: Patient on Blood Right: Surgical Shoe with Thinner Pressure Relief Insole Pain Present Now: No Electronic Signature(s) Signed: 11/15/2017 4:43:23 PM By: Elliot Gurney, BSN, RN, CWS, Kim RN, BSN Entered By: Elliot Gurney, BSN, RN, CWS, Kim on 11/12/2017 08:19:23 [name] ([id number]) -------------------------------------------------------------------------------- Encounter Discharge Information Details Patient Name: [name] Date of Service: 11/12/2017 8:15 AM Medical Record Number: [id number] Patient Account Number: [id number] Date of Birth/Sex: 09-26-1945 (72 y.o. M) Treating RN: Huel Coventry Primary Care Nyal Schachter: [name] Other Clinician: Referring Jettson Crable: [name] Treating Chrisette Man/Extender: Linwood Dibbles, HOYT Weeks in Treatment: 95 Encounter Discharge  Information Items Schedule Follow-up Appointment: No Medication Reconciliation completed and No provided to Patient/Care Clinton Dragone: Patient Clinical Summary of Care: Declined Electronic Signature(s) Signed: 11/13/2017 10:32:15 AM By: Gwenlyn Perking Entered By: Gwenlyn Perking on 11/12/2017 08:48:29 [name] ([id number]) -------------------------------------------------------------------------------- Lower Extremity Assessment Details Patient Name: [name] Date of Service: 11/12/2017 8:15 AM Medical Record Number: [id number] Patient Account Number: [id number] Date of Birth/Sex: 1945/09/20 (72 y.o. M) Treating RN: Huel Coventry Primary Care Tomekia Helton: [name] Other Clinician: Referring Aicia Babinski: [name] Treating Haston Casebolt/Extender: STONE III, HOYT Weeks in Treatment: 95 Edema Assessment Assessed: [Left: No] [Right: No] Edema: [Left: N] [Right: o] Vascular Assessment Claudication: Claudication Assessment [Right:None] Pulses: Dorsalis Pedis Palpable: [Right:Yes] Posterior Tibial Extremity colors, hair growth, and conditions: Extremity Color: [Right:Hyperpigmented] Hair Growth on Extremity: [Right:No] Temperature of Extremity: [Right:Warm] Capillary Refill: [Right:< 3 seconds] Toe Nail Assessment Left: Right: Thick: Yes Discolored: Yes Deformed: Yes Improper Length and Hygiene: Yes Electronic Signature(s) Signed: 11/15/2017 4:43:23 PM By: Elliot Gurney, BSN, RN, CWS, Kim RN, BSN Entered By: Elliot Gurney, BSN, RN, CWS, Kim on 11/12/2017 08:26:43 [name] ([id number]) -------------------------------------------------------------------------------- Multi Wound Chart Details Patient Name: [name] Date of Service: 11/12/2017 8:15 AM Medical Record Number: [id number] Patient Account Number: [id number] Date of Birth/Sex: Sep 02, 1945 (72 y.o. M) Treating RN: Huel Coventry Primary Care Valinda Fedie: [name] Other Clinician: Referring Kaylianna Detert: [name] Treating Brandis Matsuura/Extender: Linwood Dibbles, HOYT Weeks in Treatment: 95 Vital Signs Height(in): 76 Pulse(bpm): 79 Weight(lbs): 238 Blood Pressure(mmHg): 153/60 Body Mass Index(BMI): 29 Temperature(F): 98.2 Respiratory Rate 16 (breaths/min): Photos: [1:No Photos] [2:No Photos] [N/A:N/A] Wound Location: [1:Right, Lateral Malleolus] [2:Right, Medial Malleolus] [N/A:N/A] Wounding Event: [1:Gradually Appeared] [2:Gradually Appeared] [N/A:N/A] Primary Etiology: [1:Venous Leg Ulcer] [2:Venous Leg Ulcer] [N/A:N/A] Date Acquired: [1:08/11/2015] [2:01/30/2016] [N/A:N/A] Weeks of Treatment: [1:95] [2:93] [N/A:N/A] Wound Status: [1:Open] [2:Open] [N/A:N/A] Clustered Wound: [1:No] [2:Yes] [N/A:N/A] Measurements L x W x D [1:6x5.4x0.2] [2:3.7x3.5x0.1] [N/A:N/A] (cm) Area (cm) : [1:25.447] [2:10.171] [N/A:N/A] Volume (cm) : [1:5.089] [2:1.017] [N/A:N/A] % Reduction  in Area: [1:-47.30%] [2:11.90%] [N/A:N/A] % Reduction in Volume: [1:1.80%] [2:11.90%] [N/A:N/A] Classification: [1:Full Thickness Without Exposed Support Structures] [2:Full Thickness Without Exposed Support Structures] [N/A:N/A] Periwound Skin Texture: [1:No Abnormalities Noted] [2:No Abnormalities Noted] [N/A:N/A] Periwound Skin Moisture: [1:No Abnormalities Noted] [2:No Abnormalities Noted] [N/A:N/A] Periwound Skin Color: [1:No Abnormalities Noted No] [2:No Abnormalities Noted No] [N/A:N/A N/A] Treatment Notes Electronic Signature(s) Signed: 11/15/2017 4:43:23 PM By: Elliot Gurney, BSN, RN, CWS, Kim RN, BSN Entered By: Elliot Gurney, BSN, RN, CWS, Kim on 11/12/2017 08:28:05 [name] ([id number]) -------------------------------------------------------------------------------- Multi-Disciplinary Care Plan Details Patient Name: [name] Date of Service: 11/12/2017 8:15 AM Medical Record Number: [id number] Patient Account Number: [id number] Date of Birth/Sex: [birth year] (72 y.o. M) Treating RN: Huel Coventry Primary Care  Martice Doty: [name] Other Clinician: Referring Suan Pyeatt: [name] Treating Emori Kamau/Extender: STONE III, HOYT Weeks in Treatment: 95 Active Inactive ` Venous Leg Ulcer Nursing Diagnoses: Knowledge deficit related to disease process and management Goals: Patient will maintain optimal edema control Date Initiated: 04/03/2016 Target Resolution Date: 11/24/2016 Goal Status: Active Interventions: Compression as ordered Treatment Activities: Therapeutic compression applied : 04/03/2016 Notes: ` Wound/Skin Impairment Nursing Diagnoses: Impaired tissue integrity Goals: Ulcer/skin breakdown will heal within 14 weeks Date Initiated: 01/17/2016 Target Resolution Date: 11/24/2016 Goal Status: Active Interventions: Assess ulceration(s) every visit Notes: Electronic Signature(s) Signed: 11/15/2017 4:43:23 PM By: Elliot Gurney, BSN, RN, CWS, Kim RN, BSN Entered By: Elliot Gurney, BSN, RN, CWS, Kim on 11/12/2017 08:27:58 [name] ([id number]) -------------------------------------------------------------------------------- Pain Assessment Details Patient Name: [name] Date of Service: 11/12/2017 8:15 AM Medical Record Number: [id number] Patient Account Number: [id number] Date of Birth/Sex: [birth year] (72 y.o. M) Treating RN: Huel Coventry Primary Care Nikolay Demetriou: [name] Other Clinician: Referring Ryver Poblete: [name] Treating Prudy Candy/Extender: Linwood Dibbles, HOYT Weeks in Treatment: 95 Active Problems Location of Pain Severity and Description of Pain Patient Has Paino No Site Locations With Dressing Change: No Pain Management and Medication Current Pain Management: Goals for Pain Management Topical or injectable lidocaine is offered to patient for acute pain when surgical debridement is performed. If needed, Patient is instructed to use over the counter pain medication for the following 24-48 hours after debridement. Wound care MDs do not prescribed pain  medications. Patient has chronic pain or uncontrolled pain. Patient has been instructed to make an appointment with their Primary Care Physician for pain management. Electronic Signature(s) Signed: 11/15/2017 4:43:23 PM By: Elliot Gurney, BSN, RN, CWS, Kim RN, BSN Entered By: Elliot Gurney, BSN, RN, CWS, Kim on 11/12/2017 08:19:32 [name] ([id number]) -------------------------------------------------------------------------------- Wound Assessment Details Patient Name: [name] Date of Service: 11/12/2017 8:15 AM Medical Record Number: [id number] Patient Account Number: [id number] Date of Birth/Sex: 1946-05-09 (72 y.o. M) Treating RN: Huel Coventry Primary Care Charolette Bultman: [name] Other Clinician: Referring Ailyne Pawley: [name] Treating Elihue Ebert/Extender: STONE III, HOYT Weeks in Treatment: 95 Wound Status Wound Number: 1 Primary Etiology: Venous Leg Ulcer Wound Location: Right, Lateral Malleolus Wound Status: Open Wounding Event: Gradually Appeared Date Acquired: 08/11/2015 Weeks Of Treatment: 95 Clustered Wound: No Photos Photo Uploaded By: Elliot Gurney, BSN, RN, CWS, Kim on 11/12/2017 11:03:29 Wound Measurements Length: (cm) 6 Width: (cm) 5.4 Depth: (cm) 0.2 Area: (cm) 25.447 Volume: (cm) 5.089 % Reduction in Area: -47.3% % Reduction in Volume: 1.8% Wound Description Full Thickness Without Exposed Support Classification: Structures Periwound Skin Texture Texture Color No Abnormalities Noted: No No Abnormalities Noted: No Moisture No Abnormalities Noted: No Electronic Signature(s) Signed: 11/15/2017 4:43:23 PM By: Elliot Gurney, BSN, RN, CWS, Kim RN, BSN Entered By: Elliot Gurney, BSN,  RN, CWS, Kim on 11/12/2017 08:26:00 [name], [name] ([id number]) -------------------------------------------------------------------------------- Wound Assessment Details Patient Name: [name], [name] Date of Service: 11/12/2017 8:15 AM Medical Record Number: [id number] Patient  Account Number: [id number] Date of Birth/Sex: 1946-06-02 15(71 y.o. M) Treating RN: Huel CoventryWoody, Kim Primary Care Franchelle Foskett: Darreld McleanMILES, LINDA Other Clinician: Referring Velia Pamer: Darreld McleanMILES, LINDA Treating Zacharia Sowles/Extender: STONE III, HOYT Weeks in Treatment: 95 Wound Status Wound Number: 2 Primary Etiology: Venous Leg Ulcer Wound Location: Right, Medial Malleolus Wound Status: Open Wounding Event: Gradually Appeared Date Acquired: 01/30/2016 Weeks Of Treatment: 93 Clustered Wound: Yes Photos Photo Uploaded By: Elliot GurneyWoody, BSN, RN, CWS, Kim on 11/12/2017 11:03:29 Wound Measurements Length: (cm) 3.7 Width: (cm) 3.5 Depth: (cm) 0.1 Area: (cm) 10.171 Volume: (cm) 1.017 % Reduction in Area: 11.9% % Reduction in Volume: 11.9% Wound Description Full Thickness Without Exposed Support Classification: Structures Periwound Skin Texture Texture Color No Abnormalities Noted: No No Abnormalities Noted: No Moisture No Abnormalities Noted: No Electronic Signature(s) Signed: 11/15/2017 4:43:23 PM By: Elliot GurneyWoody, BSN, RN, CWS, Kim RN, BSN Entered By: Elliot GurneyWoody, BSN, RN, CWS, Kim on 11/12/2017 08:26:00 [name], [name] ([id number]) -------------------------------------------------------------------------------- Vitals Details Patient Name: [name], [name] Date of Service: 11/12/2017 8:15 AM Medical Record Number: [id number] Patient Account Number: [id number] Date of Birth/Sex: 1946-06-02 [age] y.o. M) Treating RN: Huel CoventryWoody, Kim Primary Care Leanore Biggers: Darreld McleanMILES, LINDA Other Clinician: Referring Ananda Sitzer: Darreld McleanMILES, LINDA Treating Maison Kestenbaum/Extender: STONE III, HOYT Weeks in Treatment: 95 Vital Signs Time Taken: 08:20 Temperature (F): 98.2 Height (in): 76 Pulse (bpm): 79 Weight (lbs): 238 Respiratory Rate (breaths/min): 16 Body Mass Index (BMI): 29 Blood Pressure (mmHg): 153/60 Reference Range: 80 - 120 mg / dl Electronic Signature(s) Signed: 11/15/2017 4:43:23 PM By: Elliot GurneyWoody, BSN, RN, CWS, Kim RN, BSN Entered By:  Elliot GurneyWoody, BSN, RN, CWS, Kim on 11/12/2017 08:21:17

## 2017-11-17 NOTE — Progress Notes (Signed)
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 right lateral greater than right medial lower extremity wounds in the setting of previous skin grafting and trauma. He has least to vein laser ablation scheduled for February 2 for venous reflux. He does not have significant arterial disease. Problem has been very difficult to handle surface slough/necrotic tissue. Recently using Iodoflex for this with some, albeit slow improvement 09/25/16;  right lateral greater than right medial lower extremity venous wounds in the setting of previous skin  grafting. He is going for ablation surgery on February 2 after this he'll come back here for rewrap. He has been using Iodoflex as the primary dressing. 10/02/16; right lateral greater than right medial lower extremity wounds in the setting of previous skin grafting. He had his ablation surgery last week, I don't have a report. He tolerated this well. Came in with a thigh-high Unna boots on Friday. We have been using Iodoflex as the primary dressing. His measurements are improving 10/09/16; continues to make nice aggressive in terms of the wounds on his lateral and medial right ankle in the setting of previous skin grafting. Yesterday he noticed drainage at one of his surgical sites from his venous ablation on the right calf. He took off the bandage over this area felt a "popping" sensation and a reddish-brown drainage. He is not complaining of any pain 10/16/16; he continues to make nice progression in terms of the wounds on the lateral and medial malleolus. Both smaller using Iodoflex. He had a surgical area in his posterior mid calf we have been using iodoform. All the wounds are down and dimensions 10/23/16; the patient arrives today with no complaints. He states the Iodoflex is a bit uncomfortable. He is not systemically unwell. We have been using Iodoflex to the lateral right ankle and the medial and Aquacel Ag to the reflux surgical wound on the posterior right calf. All of these wounds are doing well 10/30/16; patient states he has no pain no systemic symptoms. I changed him to [facility] last week. Although the wounds are doing well 11/06/16; patient reports no pain or systemic symptoms. We continue with Hydrofera Blue. Both wound areas on the medial and lateral ankle appear to be doing well with improvement and dimensions and improvement in the wound bed. 11/13/16; patient's dimensions continued to improve. We continue with Hydrofera Blue on the medial and lateral side. Appear to be doing  well with healthy granulation and advancing epithelialization 11/20/16; patient's dimensions improving laterally by about half a centimeter in length. Otherwise no change on the medial side. Using [facility] 12/04/16; no major change in patient's wound dimensions. Intake nurse reports more drainage. 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 He is voicing no complaints or concerns. He has been tolerating his twice weekly compression therapy changes 12/11/16 Patient complains of pain and increased drainage.. wants hydrofera blue 12/18/16 improvement. Sorbact 12/25/16; medial wound is smaller, lateral measures the same. Still on sorbact 01/01/17; medial wound continues to be smaller, lateral measures about the same however there is clearly advancing epithelialization here as well in fact I think the wound will ultimately divided into 2 open areas 01/08/17; unfortunately today fairly significant regression in several areas. Surface of the lateral wound covered again in adherent necrotic material which is difficult to debridement. He has significant surrounding skin maceration. The expanding area of tissue epithelialization in the middle of the wound that was encouraging last week appears to be smaller. There is no surrounding tenderness. The area on the medial leg also did not seem to be as healthy as last week, the reason for this regression this week is not totally clear. We have been using Sorbact for the last 4 weeks. We'll switched of polymen AG which we will order via home medical supply. If there is a  problem with this would switch back to Iodoflex 01/15/18; drainage,odor. No change. Switched to polymen last week 01/22/17; still continuous drainage. Culture I did last week showed a few Proteus pansensitive. I did this culture because of drainage. Put him on Augmentin which she has been taking  since Saturday however he is developed 4-5 liquid bowel movements. He is also on Coumadin. Beyond this wound is not changed at all, still nonviable necrotic surface material which I debrided reveals healthy granulation line 01/29/17; still copious amounts of drainage reported by her intake nurse. Wound measuring slightly smaller. Currently fact on Iodoflex although I'm looking forward to changing back to perhaps [facility] or polymen AG 02/05/17; still large amounts of drainage and presenting with really large amounts of adherent slough and necrotic material over the remaining open area of the wound. We have been using Iodoflex but with little improvement in the surface. Change to [facility] 02/12/17; still large amount of drainage. Much less adherent slough however. Started [employer]	[place] last week YERIEL, MINEO ([id number]) 02/19/17; drainage is better this week. Much less adherent slough. Perhaps some improvement in dimensions. Using [facility] 02/26/17; severe venous insufficiency wounds on the right lateral and right medial leg. Drainage is some better and slough is less adherent we've been using Hydrofera Blue 03/05/17 on evaluation today patient appears to be doing well. His wounds have been decreasing in size and overall he is pleased with how this is progressing. We are awaiting approval for the epigraph which has previously been recommended in the meantime the [facility] Dressing is to be doing very well for him. 03/12/17; wound dimensions are smaller still using Hydrofera Blue. Comes in on Fridays for a dressing change. 03/19/17; wound dimensions continued to contract. Healing of this wound is complicated by continuous significant drainage as well as recurrent buildup of necrotic surface material. We looked into Apligraf and he has a $290 co-pay per application but truthfully I think the drainage as well as the nonviable surface would preclude use of Apligraf there are any  other skin substitute at this point therefore the continued plan will be debridement each clinic visit, 2 times a week dressing changes and continued use of Hydrofera Blue. improvement has been very slow but sustained 03/26/17 perhaps slight improvements in peripheral epithelialization is especially inferiorly. Still with large amount of drainage and tightly adherent necrotic surface on arrival. Along with the intake nurse I reviewed previous treatment. He worsened on Iodoflex and had a 4 week trial of sorbact, Polymen AG and a long courses of Aquacel Ag. He is not a candidate for advanced treatment options for many reasons 04/09/17 on evaluation today patient's right lower extremity wounds appear to be doing a little better. Fortunately he has no significant discomfort and has been tolerating the dressing changes including the wraps without complication. With that being said he really does not have swelling anymore compared to what he has had in the past following his vascular intervention. He wonders if potentially we could attempt avoiding the rats to see if he could cleanse the wound in between to try to prevent some of the fiber and its buildup that occurs in the interim between when we see him week to week. No fevers, chills, nausea, or vomiting noted at this time. 04/16/17; noted that the staff made a choice last week not to put him in compression. The patient is changing his dressing at home using [facility] and changing every second day. His wife is washing  the wound with saline. He is using kerlix. Surprisingly he has not developed a lot of edema. This choice was made because of the degree of fluid retention and maceration even with changing his dressing twice a week 04/23/17; absolutely no change. Using Hydrofera Blue. Recurrent tightly adherent nonviable surface material. This is been refractory to Iodoflex, sorbact and now Hydrofera Blue. More recently he has been changing his own  dressings at home, cleansing the wound in the shower. He has not developed lower extremity edema 04/30/17; if anything the wound is larger still in adherent surface although it debrided easily today. I've been using Mehta honey for 1 week and I'd like to try and do it for a second week this see if we can get some form of viable surface here. 05/07/17; patient arrives with copious amounts of drainage and some pain in the superior part of the wound. He has not been systemically unwell. He's been changing this daily at home 05/14/17; patient arrives today complaining of less drainage and less pain. Dimensions slightly better. Surface culture I did of this last week grew MSSA I put him on dicloxacillin for 10 days. Patient requesting a further prescription since he feels so much better. He did not obtain the [facility] AG for reasons that aren't clear, he has been using Hydrofera Blue 05/21/17; perhaps some less drainage and less pain. He is completing another week of doxycycline. Unfortunately there is no change in the wound measurements are appearance still tightly adherent necrotic surface material that is really defied treatment. We have been using Hydrofera Blue most recently. He did not manage to get Anasept. He was told it was prescription at [facility] 05/29/17; absolutely no improvement in these wound areas. He had remote plastic surgery with who was done at [facility] in [place] surrounding this area. This was a traumatic wound with extensive plastic surgery/skin grafting at that time. I switched him to sorbact last week to see if he can do anything about the recurrent necrotic surface and drainage. This is largely been refractory to Iodoflex/Hydrofera Blue/alginates. I believe we tried Elby Showers and more recently Medi honey l however the drainage is really too excessive 06/04/17; patient went to see Dr. Ulice Bold. Per the patient she ordered him a compression stocking. Continued the Anasept gel  and sorbact was already on. No major improvement in the condition of the wounds in fact the medial wound looks larger. Again per the patient she did not feel a skin graft or operative debridement was indicated The patient had previous venous ablation in February 2018. Last arterial studies were in December 2017 and were within normal limits 06/18/17; patient arrives back in clinic today using Anasept gel with sorbact. He changes this every day is wearing his own compression stocking. The patient actually saw Dr. Leta Baptist of plastic surgery. I've reviewed her note. The appointment was on 05/30/17. The basic issue with here was that she did not feel that skin grafts for patients with underlying stasis and edema have a good long-term success rate. She also discuss skin substitutes which has been done here as well however I have not been able to get the surface of these wounds to something that I think would support skin substitutes. This is why he felt he might need an [name], [name]. ([id number]) operative debridement more aggressively than I can do in this clinic Review of systems; is otherwise negative he feels well 07/02/17; patient has been using Anasept gel with Sorbact. He changes this every day  scrubs out the wound beds. Arrives today with the wound bed looking some better. Easier debridement 07/16/17; patient has been using Anasept gel was sorbact for about a month now. The necrotic service on his wound bed is better and the drainage is now down to minimal but we've not made any improvements in the epithelialization. Change to Santyl today. Surface of the wound is still not good enough to support skin substitutes 07/30/17 on evaluation today patient appears to be doing very well in regard to his right bimalleolus wounds. He has been tolerating the treatment with the Anasept gel and sorbact. However we were going to try and switch to Santyl to be more aggressive with these wounds. This was  however cost prohibitive. Therefore we will likely go back to the sorbact. Nonetheless he is not having any significant discomfort he still is forming slough over the wound location but nothing too significant. The wounds do appear to be filling in nicely. 08/13/17; I have not seen this wound in about a month. There is not much change. The fact the area medially looks larger. He has been using sorbact and Anasept for over a month now without much effect. Arrives in clinic stating that he does not want the area debrided 08/28/17; patient arrives with the areas on his right medial and right lateral ankle worse. The wounds of expanded there is erythema and still drainage. There is no pain and no tenderness. We had been using Keracel AG clearly not doing well. 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 He states that even the evening that we began utilizing the Iodoflex he started having burning. Subsequently this never really improved and he eventually discontinue the use of the Iodoflex altogether. Unfortunately he did not let us know about any of this until today. I'm unsure of any specific infection at this point although I am going to obtain a wound culture today in order to see if there's anything that could potentially be causing an infection as well. It does sound as if he's had the Iodoflex before and did not have this kind of reaction although this does sound very specific for a reaction to the Iodoflex. 09/17/17 on evaluation today patient appears to be doing significantly better compared to last week's evaluation. At that time it actually appears that he did have an infection the good news is that we have been able to get this under  control with switching to the [facility] solution he's not having as much pain and discomfort he still does have some erythema surrounding the medial aspect wound. He has been tolerating the Dakin's soaked gauze dressing which is excellent and that his wounds do not seem to have as much adherent slough. Overall I'm pleased with the progress she's made in last week. 11/05/17 on evaluation today patient appears to be doing better in regard to his right medial and lateral malleolus ulcers. He has been tolerating the dressing changes without complication. I do flight the Freada Bergeron is helping with additional granulation at this point in the wound beds do appear to be a little bit better. With that being said patient does not appear to have any evidence of significant infection at this point there is no  erythema as he has previously noted he does note that the 30 day course of antibiotics I placed him on will end tomorrow. 11/12/17 on evaluation today patient actually appears to be doing excellent in regard to his right medial and lateral malleolus ulcers. He has been tolerating the dressing changes without complication. Fortunately he has no evidence of infection at this time and overall I believe he is progressing extremely nicely he has a lot of new epithelialization noted on evaluation today. Patient likewise is very pleased with the progress even just since last week. Patient History Information obtained from Patient. Social History Former smoker - 10 years ago, Marital Status - Married, Alcohol Use - Moderate, Drug Use - No History, Caffeine Use - Never. Medical And Surgical History Notes Constitutional Symptoms (General Health) Vein Filter (groin area); CODA; H/O Blood Clots; pulmonary hypertensive arterial disease Review of Systems (ROS) Constitutional Symptoms (General Health) WORTHY, BOSCHERT. ([id number]) Denies complaints or symptoms of Fever, Chills. Respiratory The patient has no complaints  or symptoms. Cardiovascular Complains or has symptoms of LE edema. Psychiatric The patient has no complaints or symptoms. Objective Constitutional Well-nourished and well-hydrated in no acute distress. Vitals Time Taken: 8:20 AM, Height: 76 in, Weight: 238 lbs, BMI: 29, Temperature: 98.2 F, Pulse: 79 bpm, Respiratory Rate: 16 breaths/min, Blood Pressure: 153/60 mmHg. Respiratory normal breathing without difficulty. Cardiovascular trace pitting edema of the bilateral lower extremities. Psychiatric this patient is able to make decisions and demonstrates good insight into disease process. Alert and Oriented x 3. pleasant and cooperative. General Notes: Patient's wounds did have evidence of slough covering the surface of the wound on evaluation today. With that being said this was easily removed without complication and post debridement the wound beds appear to be very clean with a excellent granular surface and good epithelialization along the border. There is no maceration noted. Integumentary (Hair, Skin) Wound #1 status is Open. Original cause of wound was Gradually Appeared. The wound is located on the Right,Lateral Malleolus. The wound measures 6cm length x 5.4cm width x 0.2cm depth; 25.447cm^2 area and 5.089cm^3 volume. Wound #2 status is Open. Original cause of wound was Gradually Appeared. The wound is located on the Right,Medial Malleolus. The wound measures 3.7cm length x 3.5cm width x 0.1cm depth; 10.171cm^2 area and 1.017cm^3 volume. 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 There was a Skin/Subcutaneous Tissue Debridement ([id number]) debridement with total area of 32.4 sq cm performed by STONE III, HOYT E., PA-C. with the following instrument(s): Curette to remove Viable and Non-Viable tissue/material including Fibrin/Slough and Subcutaneous after achieving pain control using Other (lidocaine 4%). A time out was conducted at 08:30, prior to the start of the procedure. A Moderate amount of bleeding was controlled with Pressure. The procedure was tolerated well with a pain level of 3 throughout and a pain level of 0 following the procedure. Post Debridement Measurements:  6cm length x 5.4cm width x 0.2cm depth; 5.089cm^3 volume. Character of Wound/Ulcer Post Debridement requires further debridement. Severity of Tissue Post Debridement is: Fat layer exposed. 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 There was a Skin/Subcutaneous Tissue Debridement ([id number]) debridement with total area of 12.95 sq cm performed by STONE III, HOYT E., PA-C. with the following instrument(s): Curette to remove Viable and Non-Viable tissue/material including Fibrin/Slough and Subcutaneous after achieving pain control using Other (lidocaine 4%). A time out was conducted at 08:30, prior to the start of the procedure. A Moderate amount of bleeding was controlled with Pressure. The procedure was tolerated well with a pain level of 3 throughout and a pain level of 0  following the procedure. Post Debridement Measurements: 3.7cm length x 3.5cm width x 0.2cm depth; 2.034cm^3 volume. Character of Wound/Ulcer Post Debridement requires further debridement. Severity of Tissue Post Debridement is: Fat layer exposed. Post procedure Diagnosis Wound #2: Same as Pre-Procedure Plan Wound Cleansing: Wound #1 Right,Lateral Malleolus: Clean wound with Normal Saline. Cleanse wound with mild soap and water May Shower, gently pat wound dry prior to applying new dressing. Wound #2 Right,Medial Malleolus: Clean wound with Normal Saline. Cleanse wound with mild soap and water May Shower, gently pat wound dry prior to applying new dressing. Anesthetic (add to Medication List): Wound #1 Right,Lateral Malleolus: Topical Lidocaine 4% cream applied to wound bed prior to debridement (In Clinic Only). Wound #2 Right,Medial Malleolus: Topical Lidocaine 4% cream applied to wound bed prior to debridement (In Clinic Only). Skin Barriers/Peri-Wound Care: JAFARI, MCKILLOP ([id number]) Wound #1 Right,Lateral Malleolus: Barrier cream Wound #2 Right,Medial Malleolus: Barrier cream Primary Wound Dressing: Wound #1 Right,Lateral Malleolus: Hydrogel Prisma Ag Wound #2 Right,Medial Malleolus: Hydrogel Prisma Ag Secondary Dressing: Wound #1 Right,Lateral Malleolus: ABD pad Conform/Kerlix XtraSorb Wound #2 Right,Medial Malleolus: ABD pad Conform/Kerlix XtraSorb Dressing Change Frequency: Wound #1 Right,Lateral Malleolus: Change dressing every day. Wound #2 Right,Medial Malleolus: Change dressing every day. Follow-up Appointments: Wound #1 Right,Lateral Malleolus: Return Appointment in 1 week. Wound #2 Right,Medial Malleolus: Return Appointment in 1 week. Edema Control: Wound #1 Right,Lateral Malleolus: Elevate legs to the level of the heart and pump ankles as often as possible Wound #2 Right,Medial Malleolus: Elevate legs to the level of the heart and pump ankles  as often as possible Additional Orders / Instructions: Wound #1 Right,Lateral Malleolus: Increase protein intake. Wound #2 Right,Medial Malleolus: Increase protein intake. Medications-please add to medication list.: Wound #1 Right,Lateral Malleolus: Other: - Vitamin C, Zinc, MVI Wound #2 Right,Medial Malleolus: Other: - Vitamin C, Zinc, MVI I am at this point in time going to recommend that we continue with the Current wound care measures since he seems to be doing so well at this point. Patient is in agreement with the plan and we will therefore continue with the Prisma. Please see above for specific wound care orders. We will see patient for re-evaluation in 1 week(s) here in the clinic. If anything worsens or changes patient will contact our office for additional recommendations. KYNDALL, CHAPLIN ([id number]) Electronic Signature(s) Signed: 11/13/2017 4:20:45 AM By: Lenda Kelp PA-C Entered By: Lenda Kelp on 11/12/2017 09:25:30 [name] ([id number]) -------------------------------------------------------------------------------- ROS/PFSH Details Patient Name: [name] Date of Service: 11/12/2017 8:15 AM Medical Record Number: [id number] Patient Account Number: [id number] Date of Birth/Sex: 10/18/1945 (72 y.o. M) Treating  RN: Huel Coventry Primary Care Provider: [name] Other Clinician: Referring Provider: [name] Treating Provider/Extender: Linwood Dibbles, HOYT Weeks in Treatment: 95 Information Obtained From Patient Wound History Do you currently have one or more open woundso Yes How many open wounds do you currently haveo 1 Approximately how long have you had your woundso 5 months How have you been treating your wound(s) until nowo saline, dressing Has your wound(s) ever healed and then re-openedo Yes Have you had any lab work done in the past montho No Have you tested positive for an antibiotic resistant organism (MRSA, VRE)o No Have you  tested positive for osteomyelitis (bone infection)o No Have you had any tests for circulation on your legso No Constitutional Symptoms (General Health) Complaints and Symptoms: Negative for: Fever; Chills Medical History: Past Medical History Notes: Vein Filter (groin area); CODA; H/O Blood Clots; pulmonary hypertensive arterial disease Cardiovascular Complaints and Symptoms: Positive for: LE edema Medical History: Negative for: Angina; Arrhythmia; Congestive Heart Failure; Coronary Artery Disease; Deep Vein Thrombosis; Hypertension; Hypotension; Myocardial Infarction; Peripheral Arterial Disease; Peripheral Venous Disease; Phlebitis; Vasculitis Eyes Medical History: Positive for: Cataracts - removed Negative for: Glaucoma; Optic Neuritis Ear/Nose/Mouth/Throat Medical History: Negative for: Chronic sinus problems/congestion Hematologic/Lymphatic Medical History: Negative for: Anemia; Hemophilia; Human Immunodeficiency Virus; Lymphedema; Sickle Cell Disease Respiratory Paxton, Odilon C. ([id number]) Complaints and Symptoms: No Complaints or Symptoms Medical History: Positive for: Chronic Obstructive Pulmonary Disease (COPD) Negative for: Aspiration; Asthma; Pneumothorax; Sleep Apnea; Tuberculosis Gastrointestinal Medical History: Negative for: Cirrhosis ; Colitis; Crohnos; Hepatitis A; Hepatitis B; Hepatitis C Endocrine Medical History: Negative for: Type I Diabetes; Type II Diabetes Genitourinary Medical History: Negative for: End Stage Renal Disease Immunological Medical History: Negative for: Lupus Erythematosus; Raynaudos Integumentary (Skin) Medical History: Negative for: History of Burn; History of pressure wounds Musculoskeletal Medical History: Positive for: Osteoarthritis Negative for: Gout; Rheumatoid Arthritis; Osteomyelitis Neurologic Medical History: Negative for: Dementia; Neuropathy; Quadriplegia; Paraplegia; Seizure Disorder Oncologic Medical  History: Negative for: Received Chemotherapy; Received Radiation Psychiatric Complaints and Symptoms: No Complaints or Symptoms Medical History: Negative for: Anorexia/bulimia; Confinement Anxiety HBO Extended History Items Eyes: Cataracts Woodlief, Talha C. ([id number]) Immunizations Pneumococcal Vaccine: Received Pneumococcal Vaccination: No Implantable Devices Family and Social History Former smoker - 10 years ago; Marital Status - Married; Alcohol Use: Moderate; Drug Use: No History; Caffeine Use: Never; Advanced Directives: No; Patient does not want information on Advanced Directives; Living Will: No; Medical Power of Attorney: No Physician Affirmation I have reviewed and agree with the above information. Electronic Signature(s) Signed: 11/13/2017 4:20:45 AM By: Lenda Kelp PA-C Signed: 11/15/2017 4:43:23 PM By: Elliot Gurney BSN, RN, CWS, Kim RN, BSN Entered By: Lenda Kelp on 11/12/2017 09:24:47 [name] ([id number]) -------------------------------------------------------------------------------- SuperBill Details Patient Name: [name] Date of Service: 11/12/2017 Medical Record Number: [id number] Patient Account Number: [id number] Date of Birth/Sex: September 14, 1945 (72 y.o. 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, initial encounter T81.31XD Disruption of external operation (surgical) wound, not elsewhere classified, subsequent  encounter L97.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 of skin Facility Procedures CPT4 Code: 02725366 Description: 11042 -  DEB SUBQ TISSUE 20 SQ CM/< ICD-10 Diagnosis Description L97.212 Non-pressure chronic ulcer of right calf with fat layer expos Modifier: ed Quantity: 1 CPT4 Code: 16109604 Description: 11045 - DEB SUBQ TISS EA ADDL 20CM ICD-10 Diagnosis Description L97.212 Non-pressure chronic ulcer of right calf with fat layer expos Modifier: ed Quantity: 2 Physician Procedures CPT4 Code: 5409811 Description: 11042 - WC PHYS SUBQ TISS 20 SQ CM ICD-10 Diagnosis Description L97.212 Non-pressure chronic ulcer of right calf with fat layer expose Modifier: d Quantity: 1 CPT4 Code: [id number] Description: 11045 - WC PHYS SUBQ TISS EA ADDL 20 CM ICD-10 Diagnosis Description 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

## 2017-11-19 ENCOUNTER — Encounter: Payer: Medicare HMO | Admitting: Physician Assistant

## 2017-11-19 DIAGNOSIS — I87331 Chronic venous hypertension (idiopathic) with ulcer and inflammation of right lower extremity: Secondary | ICD-10-CM | POA: Diagnosis not present

## 2017-11-20 ENCOUNTER — Other Ambulatory Visit: Payer: Self-pay | Admitting: Physician Assistant

## 2017-11-20 DIAGNOSIS — I87331 Chronic venous hypertension (idiopathic) with ulcer and inflammation of right lower extremity: Secondary | ICD-10-CM

## 2017-11-20 DIAGNOSIS — L97919 Non-pressure chronic ulcer of unspecified part of right lower leg with unspecified severity: Principal | ICD-10-CM

## 2017-11-21 NOTE — Progress Notes (Signed)
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 the lateral wound that showed a combination of a few Proteus and Pseudomonas. Doxycycline might of covered the Proteus but would be unlikely to cover the Pseudomonas. He is on Coumadin.  He arrives in the clinic today feeling a lot better states the pain is a lot better but nothing specific really was done other than to rewrap the foot also noted that he had arterial studies ordered in August although these were never done. It is reasonable to go ahead and reorder these. 08/14/16; generally arrives in a better state today in terms of the wounds he has taken cefdinir for one week. Our intake nurse reports copious amounts of drainage but  the patient is complaining of much less pain. He is not had his PT and INR checked and I've asked him to do this today or tomorrow. 08/24/2016 -- patient arrives today after 10 days and said he had a stomach upset. His arterial study was done and I have reviewed this report and find it to be within normal limits. However I did not note any venous duplex studies for reflux, and Dr. Leanord Hawkingobson may have ordered these in the past but I will leave it to him to decide if he needs these. The patient has finished his course of cefdinir. 08/28/16; patient arrives today again with copious amounts of thick really green drainage for our intake nurse. He states he has a very tender spot at the superior part of the lateral wound. Wounds are larger 09/04/16; no real change in the condition of this patient's wound still copious amounts of surface slough. Started him on Iodoflex last week he is completing another course of Cefdinir or which I think was done empirically. His arterial study showed ABIs were 1.1 on the right 1.5 on the left. He did have a slightly reduced ABI in the right the left one was not obtained. Had calcification of the right posterior tibial artery. The interpretation was no segmental stenosis. His waveforms were triphasic. [name] StackBLACKWELL, [place] C. ([id number]) His reflux studies are later this month. Depending on this I'll send him for a vascular consultation, he may need to see plastic surgery as I believe he is had plastic surgery on this foot in the  past. He had an injury to the foot in the 1980s. 1/16 /18 right lateral greater than right medial ankle wounds on the right in the setting of previous skin grafting. 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 He does not have arterial issues. Each week he comes in with the same adherent surface slough although there was less of this today 09/18/16; right lateral greater than right medial lower extremity wounds in the setting of previous skin grafting and trauma. He has least to vein laser ablation scheduled for February 2 for venous reflux. He does not have significant arterial disease. Problem has been very difficult to handle surface slough/necrotic tissue. Recently using Iodoflex for this with some, albeit slow improvement 09/25/16; right lateral greater than right medial lower extremity venous wounds in the setting of previous skin grafting. He is going for ablation surgery on February 2 after this he'll come back here for rewrap. He has been using Iodoflex as the primary dressing. 10/02/16; right lateral greater than right medial lower extremity wounds in the setting of previous skin grafting. He had his ablation surgery last week, I don't have a report. He tolerated this well. Came in with a thigh-high Unna boots on Friday. We have been using Iodoflex as the primary dressing. His measurements are improving 10/09/16; continues to make nice aggressive in terms of the wounds on his lateral and medial right ankle in the setting of previous skin grafting. Yesterday he noticed drainage at one of his surgical sites from his venous ablation on the right calf. He took off the bandage over this area felt a "popping" sensation and a reddish-brown drainage. He is not complaining of any pain 10/16/16; he continues to make nice progression in terms of the wounds on the lateral and medial malleolus. Both smaller using Iodoflex. He  had a surgical area in  his posterior mid calf we have been using iodoform. All the wounds are down and dimensions 10/23/16; the patient arrives today with no complaints. He states the Iodoflex is a bit uncomfortable. He is not systemically unwell. We have been using Iodoflex to the lateral right ankle and the medial and Aquacel Ag to the reflux surgical wound on the posterior right calf. All of these wounds are doing well 10/30/16; patient states he has no pain no systemic symptoms. I changed him to [facility] last week. Although the wounds are doing well 11/06/16; patient reports no pain or systemic symptoms. We continue with Hydrofera Blue. Both wound areas on the medial and lateral ankle appear to be doing well with improvement and dimensions and improvement in the wound bed. 11/13/16; patient's dimensions continued to improve. We continue with Hydrofera Blue on the medial and lateral side. Appear to be doing well with healthy granulation and advancing epithelialization 11/20/16; patient's dimensions improving laterally by about half a centimeter in length. Otherwise no change on the medial side. Using [facility] 12/04/16; no major change in patient's wound dimensions. Intake nurse reports more drainage. 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 He is voicing no complaints or concerns. He has been tolerating his twice weekly compression therapy changes 12/11/16 Patient complains of pain and increased drainage.. wants hydrofera blue 12/18/16 improvement. Sorbact 12/25/16; medial wound is smaller, lateral measures the same. Still on sorbact 01/01/17; medial wound continues to be smaller, lateral measures about the same however there is clearly advancing epithelialization here as well in fact I think the wound will ultimately divided into 2 open areas 01/08/17; unfortunately today fairly  significant regression in several areas. Surface of the lateral wound covered again in adherent necrotic material which is difficult to debridement. He has significant surrounding skin maceration. The expanding area of tissue epithelialization in the middle of the wound that was encouraging last week appears to be smaller. There is no surrounding tenderness. The area on the medial leg also did not seem to be as healthy as last week, the reason for this regression this week is not totally clear. We have been using Sorbact for the last 4 weeks. We'll switched of polymen AG which we will order via home medical supply. If there is a problem with this would switch back to Iodoflex 01/15/18; drainage,odor. No change. Switched to polymen last week 01/22/17; still continuous drainage. Culture I did last week showed a few Proteus pansensitive. I did this culture because of drainage. Put him on Augmentin which she has been taking since Saturday however he is developed 4-5 liquid bowel movements. He is also on Coumadin. Beyond this wound is not changed at all, still nonviable necrotic surface material which I debrided reveals healthy granulation line 01/29/17; still copious amounts of drainage reported by her intake nurse. Wound measuring slightly smaller. Currently fact on Iodoflex although I'm looking forward to changing back to perhaps [facility] or polymen AG 02/05/17; still large amounts of drainage and presenting with really large amounts of adherent slough and necrotic material over the remaining open area of the wound. We have been using Iodoflex but with little improvement in the surface. Change to [facility] 02/12/17; still large amount of drainage. Much less adherent slough however. Started [employer]	[place] last week [name], [name] ([id number]) 02/19/17; drainage is better this week. Much less adherent slough. Perhaps some improvement in dimensions. Using  Hydrofera Blue 02/26/17; severe venous  insufficiency wounds on the right lateral and right medial leg. Drainage is some better and slough is less adherent we've been using Hydrofera Blue 03/05/17 on evaluation today patient appears to be doing well. His wounds have been decreasing in size and overall he is pleased with how this is progressing. We are awaiting approval for the epigraph which has previously been recommended in the meantime the [facility] Dressing is to be doing very well for him. 03/12/17; wound dimensions are smaller still using Hydrofera Blue. Comes in on Fridays for a dressing change. 03/19/17; wound dimensions continued to contract. Healing of this wound is complicated by continuous significant drainage as well as recurrent buildup of necrotic surface material. We looked into Apligraf and he has a $290 co-pay per application but truthfully I think the drainage as well as the nonviable surface would preclude use of Apligraf there are any other skin substitute at this point therefore the continued plan will be debridement each clinic visit, 2 times a week dressing changes and continued use of Hydrofera Blue. improvement has been very slow but sustained 03/26/17 perhaps slight improvements in peripheral epithelialization is especially inferiorly. Still with large amount of drainage and tightly adherent necrotic surface on arrival. Along with the intake nurse I reviewed previous treatment. He worsened on Iodoflex and had a 4 week trial of sorbact, Polymen AG and a long courses of Aquacel Ag. He is not a candidate for advanced treatment options for many reasons 04/09/17 on evaluation today patient's right lower extremity wounds appear to be doing a little better. Fortunately he has no significant discomfort and has been tolerating the dressing changes including the wraps without complication. With that being said he really does not have swelling anymore compared to what he has had in the past following his vascular  intervention. He wonders if potentially we could attempt avoiding the rats to see if he could cleanse the wound in between to try to prevent some of the fiber and its buildup that occurs in the interim between when we see him week to week. No fevers, chills, nausea, or vomiting noted at this time. 04/16/17; noted that the staff made a choice last week not to put him in compression. The patient is changing his dressing at home using Centracare and changing every second day. His wife is washing the wound with saline. He is using kerlix. Surprisingly he has not developed a lot of edema. This choice was made because of the degree of fluid retention and maceration even with changing his dressing twice a week 04/23/17; absolutely no change. Using Hydrofera Blue. Recurrent tightly adherent nonviable surface material. This is been refractory to Iodoflex, sorbact and now Hydrofera Blue. More recently he has been changing his own dressings at home, cleansing the wound in the shower. He has not developed lower extremity edema 04/30/17; if anything the wound is larger still in adherent surface although it debrided easily today. I've been using Mehta honey for 1 week and I'd like to try and do it for a second week this see if we can get some form of viable surface here. 05/07/17; patient arrives with copious amounts of drainage and some pain in the superior part of the wound. He has not been systemically unwell. He's been changing this daily at home 05/14/17; patient arrives today complaining of less drainage and less pain. Dimensions slightly better. Surface culture I did of this last week grew MSSA I put him on  dicloxacillin for 10 days. Patient requesting a further prescription since he feels so much better. He did not obtain the [facility] AG for reasons that aren't clear, he has been using Hydrofera Blue 05/21/17; perhaps some less drainage and less pain. He is completing another week of doxycycline.  Unfortunately there is no change in the wound measurements are appearance still tightly adherent necrotic surface material that is really defied treatment. We have been using Hydrofera Blue most recently. He did not manage to get Anasept. He was told it was prescription at [facility] 05/29/17; absolutely no improvement in these wound areas. He had remote plastic surgery with who was done at [facility] in [place] surrounding this area. This was a traumatic wound with extensive plastic surgery/skin grafting at that time. I switched him to sorbact last week to see if he can do anything about the recurrent necrotic surface and drainage. This is largely been refractory to Iodoflex/Hydrofera Blue/alginates. I believe we tried Elby Showers and more recently Medi honey l however the drainage is really too excessive 06/04/17; patient went to see Dr. Ulice Bold. Per the patient she ordered him a compression stocking. Continued the Anasept gel and sorbact was already on. No major improvement in the condition of the wounds in fact the medial wound looks larger. Again per the patient she did not feel a skin graft or operative debridement was indicated The patient had previous venous ablation in February 2018. Last arterial studies were in December 2017 and were within normal limits 06/18/17; patient arrives back in clinic today using Anasept gel with sorbact. He changes this every day is wearing his own compression stocking. The patient actually saw Dr. Leta Baptist of plastic surgery. I've reviewed her note. The appointment was on 05/30/17. The basic issue with here was that she did not feel that skin grafts for patients with underlying stasis and edema have a good long-term success rate. She also discuss skin substitutes which has been done here as well however I have not been able to get the surface of these wounds to something that I think would support skin substitutes. This is why he felt he might need  an [name], [name]. ([id number]) operative debridement more aggressively than I can do in this clinic Review of systems; is otherwise negative he feels well 07/02/17; patient has been using Anasept gel with Sorbact. He changes this every day scrubs out the wound beds. Arrives today with the wound bed looking some better. Easier debridement 07/16/17; patient has been using Anasept gel was sorbact for about a month now. The necrotic service on his wound bed is better and the drainage is now down to minimal but we've not made any improvements in the epithelialization. Change to Santyl today. Surface of the wound is still not good enough to support skin substitutes 07/30/17 on evaluation today patient appears to be doing very well in regard to his right bimalleolus wounds. He has been tolerating the treatment with the Anasept gel and sorbact. However we were going to try and switch to Santyl to be more aggressive with these wounds. This was however cost prohibitive. Therefore we will likely go back to the sorbact. Nonetheless he is not having any significant discomfort he still is forming slough over the wound location but nothing too significant. The wounds do appear to be filling in nicely. 08/13/17; I have not seen this wound in about a month. There is not much change. The fact the area medially looks larger. He has been using sorbact and  Anasept for over a month now without much effect. Arrives in clinic stating that he does not want the area debrided 08/28/17; patient arrives with the areas on his right medial and right lateral ankle worse. The wounds of expanded there is erythema and still drainage. There is no pain and no tenderness. We had been using Keracel AG clearly not doing well. 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 He states that even the evening that we began utilizing the Iodoflex he started having burning. Subsequently this never really improved and he eventually discontinue the use of the Iodoflex altogether. Unfortunately he did not let us know about any of this until today. I'm unsure of any specific infection at this point although I am going to obtain a wound culture today in order to see if there's anything that could potentially be causing an infection as well. It does sound as if he's had the Iodoflex before and did not have this kind of reaction although this does sound very specific for a reaction to the Iodoflex. 09/17/17 on evaluation today patient appears to be doing significantly better compared to last week's evaluation. At that time it actually appears that he did have an infection the good news is that we have been able to get this under control with switching to the [facility] solution he's not having as much pain and discomfort he still does have some erythema surrounding the medial aspect wound. He has been tolerating the Dakin's soaked gauze dressing which is excellent and that his wounds do not seem to have as much adherent slough. Overall I'm pleased with the progress she's made in last week. 11/05/17 on evaluation today patient appears to be doing better in regard to his right medial and lateral malleolus ulcers. He has been tolerating the dressing changes without complication. I do flight the Freada Bergeron is helping with additional granulation at this point in the wound beds do appear to be a little bit better. With that being said patient does not appear to have any evidence of significant infection at this point there is no erythema as he has previously noted he does note that the 30 day course of antibiotics I placed him on will end tomorrow. 11/12/17 on evaluation today patient actually  appears to be doing excellent in regard to his right medial and lateral malleolus ulcers. He has been tolerating the dressing changes without complication. Fortunately he has no evidence of infection at this time and overall I believe he is progressing extremely nicely he has a lot of new epithelialization noted on evaluation today. Patient likewise is very pleased with the progress even just since last week. 11/19/17 on evaluation today patient appears to be doing about the same in regard to his ulcerations. He does not have any additional breakdown although he really has not noted any significant improvement either over the past week. With that being said the more concerning thing at this time is that he is beginning to show signs of erythema surrounding both wounds which is what typically happens and then he will develop into a more overt infection. This has been the course for some time. I hope that doing the 30 day in a biotic cycle this  past time would break that so that he would not have the same type of thing happen again. Nonetheless it appears that is digressing back into this infected state unfortunately. With that being said he is not having any pain currently which is good he typically does start to hurt more as this progresses. Fortunately there does not appear to be any evidence of infection systemically which is good news. Patient History Information obtained from Patient. Social History [name], [name] ([id number]) Former smoker - 10 years ago, Marital Status - Married, Alcohol Use - Moderate, Drug Use - No History, Caffeine Use - Never. Medical And Surgical History Notes Constitutional Symptoms (General Health) Vein Filter (groin area); CODA; H/O Blood Clots; pulmonary hypertensive arterial disease Review of Systems (ROS) Constitutional Symptoms (General Health) Denies complaints or symptoms of Fever, Chills. Respiratory The patient has no complaints or  symptoms. Cardiovascular The patient has no complaints or symptoms. Psychiatric The patient has no complaints or symptoms. Objective Constitutional Well-nourished and well-hydrated in no acute distress. Vitals Time Taken: 8:10 AM, Height: 76 in, Weight: 238 lbs, BMI: 29, Temperature: 98.1 F, Pulse: 78 bpm, Respiratory Rate: 16 breaths/min, Blood Pressure: 151/65 mmHg. Respiratory normal breathing without difficulty. Psychiatric this patient is able to make decisions and demonstrates good insight into disease process. Alert and Oriented x 3. pleasant and cooperative. General Notes: Patient's wound beds do appear to show the erythema surrounding the periwound although there is no purulent drainage noted at this point in time. He does have slough covering the surface of the wound and this was debrided away along with biofilm and subcutaneous tissue. Post debridement the wound beds both appear to be doing much better which is good news. Integumentary (Hair, Skin) Wound #1 status is Open. Original cause of wound was Gradually Appeared. The wound is located on the Right,Lateral Malleolus. The wound measures 7cm length x 6.4cm width x 0.2cm depth; 35.186cm^2 area and 7.037cm^3 volume. There is Fat Layer (Subcutaneous Tissue) Exposed exposed. There is no tunneling or undermining noted. There is a medium amount of serosanguineous drainage noted. The wound margin is distinct with the outline attached to the wound base. There is small (1-33%) red granulation within the wound bed. There is a large (67-100%) amount of necrotic tissue within the wound bed including Adherent Slough. The periwound skin appearance exhibited: Erythema. The periwound skin appearance did not exhibit: Callus, Crepitus, Excoriation, Induration, Rash, Scarring, Dry/Scaly, Maceration, Atrophie Blanche, Cyanosis, Ecchymosis, Hemosiderin Staining, Mottled, Pallor, Rubor. The surrounding wound skin color is noted with erythema  which is circumferential. Periwound temperature was noted as No Abnormality. The periwound has tenderness on palpation. Wound #2 status is Open. Original cause of wound was Gradually Appeared. The wound is located on the Right,Medial [place], [place] C. ([id number]) Malleolus. The wound measures 4cm length x 3.2cm width x 0.1cm depth; 10.053cm^2 area and 1.005cm^3 volume. There is Fat Layer (Subcutaneous Tissue) Exposed exposed. There is no tunneling or undermining noted. There is a medium amount of serous drainage noted. The wound margin is distinct with the outline attached to the wound base. There is medium (34- 66%) red granulation within the wound bed. There is a medium (34-66%) amount of necrotic tissue within the wound bed including Adherent Slough. The periwound skin appearance exhibited: Erythema. The periwound skin appearance did not exhibit: Callus, Crepitus, Excoriation, Induration, Rash, Scarring, Dry/Scaly, Maceration, Atrophie Blanche, Cyanosis, Ecchymosis, Hemosiderin Staining, Mottled, Pallor, Rubor. The surrounding wound skin color is noted with erythema which is circumferential. 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 There was a Excisional Skin/Subcutaneous Tissue Debridement with a total area of 44.8 sq cm performed by STONE III, Adden Strout E., PA-C. With the following instrument(s): Curette. to remove Viable and  Non-Viable tissue/material Material removed includes Subcutaneous Tissue, and Slough, Fibrin/Exudate, and Bivins after achieving pain control using Lidocaine 4% Topical Solution. No specimens were taken. A time out was conducted at 08:29, prior to the start of the procedure. A Minimum amount of bleeding was controlled with Pressure. The procedure was tolerated well with a pain level of 0 throughout and a pain level of 0 following the procedure. Post Debridement Measurements: 7cm length x 6.4cm width x 0.3cm depth; 10.556cm^3 volume. Character of Wound/Ulcer Post Debridement requires further debridement. Severity of Tissue Post Debridement is: Fat layer exposed. 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 There was a Excisional Skin/Subcutaneous Tissue Debridement with a total area of 12.8 sq cm performed by STONE III, Irianna Gilday E., PA-C. With the following instrument(s): Curette. to remove Viable and Non-Viable tissue/material Material removed includes Subcutaneous Tissue, and Slough, Fibrin/Exudate, and [place] after achieving pain control using Lidocaine 4% Topical Solution. No specimens were taken. A time out was conducted at 08:29, prior to the start of the procedure. A Minimum amount of bleeding was controlled with Pressure. The procedure was tolerated well with a pain level of 0 throughout and a pain level of 0 following the procedure. Post Debridement Measurements: 4cm length x 3.2cm width x 0.2cm depth; 2.011cm^3 volume. Character of Wound/Ulcer Post Debridement requires further debridement. Severity of Tissue Post Debridement is: Fat layer exposed. Post procedure Diagnosis Wound #2: Same as Pre-Procedure Birchall, Dewitte C. ([id number]) Plan Wound Cleansing: Wound #1 Right,Lateral Malleolus: Clean wound with Normal  Saline. Cleanse wound with mild soap and water May Shower, gently pat wound dry prior to applying new dressing. Wound #2 Right,Medial Malleolus: Clean wound with Normal Saline. Cleanse wound with mild soap and water May Shower, gently pat wound dry prior to applying new dressing. Anesthetic (add to Medication List): Wound #1 Right,Lateral Malleolus: Topical Lidocaine 4% cream applied to wound bed prior to debridement (In Clinic Only). Wound #2 Right,Medial Malleolus: Topical Lidocaine 4% cream applied to wound bed prior to debridement (In Clinic Only). Skin Barriers/Peri-Wound Care: Wound #1 Right,Lateral Malleolus: Barrier cream Wound #2 Right,Medial Malleolus: Barrier cream Primary Wound Dressing: Wound #1 Right,Lateral Malleolus: Gentamicin Sulfate Cream Silver Collagen Wound #2 Right,Medial Malleolus: Gentamicin Sulfate Cream Silver Collagen Secondary Dressing: Wound #1 Right,Lateral Malleolus: ABD pad Conform/Kerlix XtraSorb Wound #2 Right,Medial Malleolus: ABD pad Conform/Kerlix XtraSorb Dressing Change Frequency: Wound #1 Right,Lateral Malleolus: Change dressing every other day. Wound #2 Right,Medial Malleolus: Change dressing every other day. Follow-up Appointments: Wound #1 Right,Lateral Malleolus: Return Appointment in 1 week. Wound #2 Right,Medial Malleolus: Return Appointment in 1 week. Edema Control: Wound #1 Right,Lateral Malleolus: Elevate legs to the level of the heart and pump ankles as often  as possible Wound #2 Right,Medial Malleolus: Elevate legs to the level of the heart and pump ankles as often as possible Additional Orders / Instructions: Wound #1 Right,Lateral Malleolus: Increase protein intake. Wound #2 Right,Medial Malleolus: Nieblas, Sanjeev C. ([id number]) Increase protein intake. Medications-please add to medication list.: Wound #1 Right,Lateral Malleolus: Other: - Vitamin C, Zinc, MVI Wound #2 Right,Medial Malleolus: Other: -  Vitamin C, Zinc, MVI Consults ordered were: Infectious Disease Radiology ordered were: Magnetic Resonance Imaging (MRI) The following medication(s) was prescribed: lidocaine topical 4 % cream 1 1 cream topical was prescribed at facility gentamicin topical 0.1 % cream cream topical applied to the wound bed in a thin film every other day when the patient performs dressing changes as directed starting 11/19/2017 This point I'm gonna recommend that we continue with the Prisma although I am going to suggest gentamicin cream be applied to the wound bed and then subsequently the Prisma applied over top of this to be changed every other day as it has been up to this point. Patient is in agreement with this plan. The good news is the wound bed does appear to be better post debridement unfortunately he just continues to develop new instances of infection within a couple of weeks of discontinuing his antibiotics. This time it was almost 2 weeks to the date before I started noticing this redeveloping today. For that reason I am going to go ahead forward with ordering an MRI of the right ankle to evaluate for anything deeper and that may be occurring such as osteomyelitis just to ensure there is nothing that I'm missing. Subsequently we will continue to see him for follow-up and serial debridement's as necessary. I am hopeful that the gentamicin cream that we are gonna use topically will help to alleviate some of this your theme is/infection that seems to be setting back in. Please see above for specific wound care orders. We will see patient for re-evaluation in 1 week(s) here in the clinic. If anything worsens or changes patient will contact our office for additional recommendations. Electronic Signature(s) Signed: 11/19/2017 11:30:52 AM By: Lenda Kelp PA-C Entered By: Lenda Kelp on 11/19/2017 11:29:42 [name]  ([id number]) -------------------------------------------------------------------------------- ROS/PFSH Details Patient Name: [name] Date of Service: 11/19/2017 8:00 AM Medical Record Number: [id number] Patient Account Number: [id number] Date of Birth/Sex: [birth year] (72 y.o. M) Treating RN: [name] Primary Care Provider: [name] Other Clinician: Referring Provider: [name] Treating Provider/Extender: STONE III, Nymir Ringler Weeks in Treatment: 96 Information Obtained From Patient Wound History Do you currently have one or more open woundso Yes How many open wounds do you currently haveo 1 Approximately how long have you had your woundso 5 months How have you been treating your wound(s) until nowo saline, dressing Has your wound(s) ever healed and then re-openedo Yes Have you had any lab work done in the past montho No Have you tested positive for an antibiotic resistant organism (MRSA, VRE)o No Have you tested positive for osteomyelitis (bone infection)o No Have you had any tests for circulation on your legso No Constitutional Symptoms (General Health) Complaints and Symptoms: Negative for: Fever; Chills Medical History: Past Medical History Notes: Vein Filter (groin area); CODA; H/O Blood Clots; pulmonary hypertensive arterial disease Eyes Medical History: Positive for: Cataracts - removed Negative for: Glaucoma; Optic Neuritis Ear/Nose/Mouth/Throat Medical History: Negative for: Chronic sinus problems/congestion Hematologic/Lymphatic Medical History: Negative for: Anemia; Hemophilia; Human Immunodeficiency Virus; Lymphedema; Sickle Cell Disease Respiratory Complaints and Symptoms: No Complaints or Symptoms  Medical History: Positive for: Chronic Obstructive Pulmonary Disease (COPD) Negative for: Aspiration; Asthma; Pneumothorax; Sleep Apnea; Tuberculosis Cardiovascular [name], FREDERICKS. ([id number]) Complaints and Symptoms: No Complaints or  Symptoms Medical History: Negative for: Angina; Arrhythmia; Congestive Heart Failure; Coronary Artery Disease; Deep Vein Thrombosis; Hypertension; Hypotension; Myocardial Infarction; Peripheral Arterial Disease; Peripheral Venous Disease; Phlebitis; Vasculitis Gastrointestinal Medical History: Negative for: Cirrhosis ; Colitis; Crohnos; Hepatitis A; Hepatitis B; Hepatitis C Endocrine Medical History: Negative for: Type I Diabetes; Type II Diabetes Genitourinary Medical History: Negative for: End Stage Renal Disease Immunological Medical History: Negative for: Lupus Erythematosus; Raynaudos Integumentary (Skin) Medical History: Negative for: History of Burn; History of pressure wounds Musculoskeletal Medical History: Positive for: Osteoarthritis Negative for: Gout; Rheumatoid Arthritis; Osteomyelitis Neurologic Medical History: Negative for: Dementia; Neuropathy; Quadriplegia; Paraplegia; Seizure Disorder Oncologic Medical History: Negative for: Received Chemotherapy; Received Radiation Psychiatric Complaints and Symptoms: No Complaints or Symptoms Medical History: Negative for: Anorexia/bulimia; Confinement Anxiety HBO Extended History Items [name], [name]. ([id number]) Eyes: Cataracts Immunizations Pneumococcal Vaccine: Received Pneumococcal Vaccination: No Implantable Devices Family and Social History Former smoker - 10 years ago; Marital Status - Married; Alcohol Use: Moderate; Drug Use: No History; Caffeine Use: Never; Advanced Directives: No; Patient does not want information on Advanced Directives; Living Will: No; Medical Power of Attorney: No Physician Affirmation I have reviewed and agree with the above information. Electronic Signature(s) Signed: 11/19/2017 11:30:52 AM By: Lenda Kelp PA-C Signed: 11/20/2017 4:37:47 PM By: Alejandro Mulling Entered By: Lenda Kelp on 11/19/2017 11:24:22 [name]  ([id number]) -------------------------------------------------------------------------------- SuperBill Details Patient Name: [name] Date of Service: 11/19/2017 Medical Record Number: [id number] Patient Account Number: [id number] Date of Birth/Sex: 27-Oct-1945 (72 y.o. 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, initial encounter T81.31XD Disruption of external operation (surgical) wound, not elsewhere classified, subsequent encounter Facility Procedures CPT4 Code: 62952841 Description: 11042 - DEB SUBQ TISSUE 20 SQ CM/< ICD-10 Diagnosis Description L97.312 Non-pressure chronic ulcer of right ankle with fat layer expo Modifier: sed Quantity: 1 CPT4 Code: 32440102 Description: 11045 - DEB SUBQ TISS EA ADDL 20CM ICD-10 Diagnosis Description L97.312 Non-pressure chronic ulcer of right ankle with fat layer expo Modifier: sed Quantity: 2 Physician Procedures CPT4 Code: 7253664 Description: 11042 - WC PHYS SUBQ TISS 20 SQ CM ICD-10 Diagnosis Description L97.312 Non-pressure chronic ulcer of right ankle with fat layer expos Modifier: ed Quantity: 1 CPT4 Code: 4034742 Description: 11045 - WC PHYS SUBQ TISS EA ADDL 20 CM ICD-10 Diagnosis Description L97.312 Non-pressure chronic ulcer of right ankle with fat layer expos Modifier: ed Quantity: 2 Electronic Signature(s) Signed: 11/19/2017 11:30:52 AM By: Lenda Kelp PA-C Entered By: Lenda Kelp on 11/19/2017 11:30:16

## 2017-11-21 NOTE — Progress Notes (Signed)
KENDREW, PACI ([id number]) Visit Report for 11/19/2017 Arrival Information Details Patient Name: [name], [name]. Date of Service: 11/19/2017 8:00 AM Medical Record Number: [id number] Patient Account Number: [id number] Date of Birth/Sex: 1945/11/05 (72 y.o. M) Treating RN: Renne Crigler Primary Care Cyrene Gharibian: [name] Other Clinician: Referring Sibbie Flammia: [name] Treating Ryker Pherigo/Extender: Linwood Dibbles, HOYT Weeks in Treatment: 96 Visit Information History Since Last Visit All ordered tests and consults were completed: No Patient Arrived: Ambulatory Added or deleted any medications: No Arrival Time: 08:09 Any new allergies or adverse reactions: No Accompanied By: self Had a fall or experienced change in No Transfer Assistance: None activities of daily living that may affect Patient Identification Verified: Yes risk of falls: Secondary Verification Process Yes Signs or symptoms of abuse/neglect since last visito No Completed: Hospitalized since last visit: No Patient Requires Transmission-Based No Implantable device outside of the clinic excluding No Precautions: cellular tissue based products placed in the center Patient Has Alerts: Yes since last visit: Patient Alerts: Patient on Blood Pain Present Now: No Thinner Electronic Signature(s) Signed: 11/20/2017 7:46:43 AM By: Renne Crigler Entered By: Renne Crigler on 11/19/2017 08:10:27 [name] ([id number]) -------------------------------------------------------------------------------- Encounter Discharge Information Details Patient Name: [name] Date of Service: 11/19/2017 8:00 AM Medical Record Number: [id number] Patient Account Number: [id number] Date of Birth/Sex: [birth year] (72 y.o. M) Treating RN: [name] Primary Care Natthew Marlatt: [name] Other Clinician: Referring Alix Stowers: [name] Treating Yolandra Habig/Extender: STONE III, HOYT Weeks in Treatment:  18 Encounter Discharge Information Items Discharge Pain Level: 0 Discharge Condition: Stable Ambulatory Status: Ambulatory Discharge Destination: Home Transportation: Private Auto Accompanied By: self Schedule Follow-up Appointment: Yes Medication Reconciliation completed and No provided to Patient/Care Gizelle Whetsel: Patient Clinical Summary of Care: Declined Electronic Signature(s) Signed: 11/19/2017 9:40:24 AM By: Curtis Sites Entered By: Curtis Sites on 11/19/2017 09:40:24 [name] ([id number]) -------------------------------------------------------------------------------- Lower Extremity Assessment Details Patient Name: [name] Date of Service: 11/19/2017 8:00 AM Medical Record Number: [id number] Patient Account Number: [id number] Date of Birth/Sex: 1946/02/03 (72 y.o. M) Treating RN: Renne Crigler Primary Care Deepti Gunawan: [name] Other Clinician: Referring Brooklynne Pereida: [name] Treating Kollins Fenter/Extender: STONE III, HOYT Weeks in Treatment: 96 Edema Assessment Assessed: [Left: No] [Right: No] Edema: [Left: N] [Right: o] Vascular Assessment Claudication: Claudication Assessment [Right:None] Pulses: Dorsalis Pedis Palpable: [Right:Yes] Posterior Tibial Extremity colors, hair growth, and conditions: Extremity Color: [Right:Normal] Hair Growth on Extremity: [Right:Yes] Temperature of Extremity: [Right:Warm] Capillary Refill: [Right:< 3 seconds] Toe Nail Assessment Left: Right: Thick: No Discolored: No Deformed: No Improper Length and Hygiene: No Electronic Signature(s) Signed: 11/20/2017 7:46:43 AM By: Renne Crigler Entered By: Renne Crigler on 11/20/2017 07:46:09 [name] ([id number]) -------------------------------------------------------------------------------- Multi Wound Chart Details Patient Name: [name] Date of Service: 11/19/2017 8:00 AM Medical Record Number: [id number] Patient Account  Number: [id number] Date of Birth/Sex: [birth year] (72 y.o. M) Treating RN: [name] Primary Care Javeon Macmurray: [name] Other Clinician: Referring Demica Zook: [name] Treating Haleem Hanner/Extender: STONE III, HOYT Weeks in Treatment: 96 Vital Signs Height(in): 76 Pulse(bpm): 78 Weight(lbs): 238 Blood Pressure(mmHg): 151/65 Body Mass Index(BMI): 29 Temperature(F): 98.1 Respiratory Rate 16 (breaths/min): Photos: [1:No Photos] [2:No Photos] [N/A:N/A] Wound Location: [1:Right Malleolus - Lateral] [2:Right Malleolus - Medial] [N/A:N/A] Wounding Event: [1:Gradually Appeared] [2:Gradually Appeared] [N/A:N/A] Primary Etiology: [1:Venous Leg Ulcer] [2:Venous Leg Ulcer] [N/A:N/A] Comorbid History: [1:Cataracts, Chronic Obstructive Cataracts, Chronic Obstructive N/A Pulmonary Disease (COPD), Pulmonary Disease (COPD), Osteoarthritis] [2:Osteoarthritis] Date Acquired: [1:08/11/2015] [2:01/30/2016] [N/A:N/A] Weeks of Treatment: [1:96] [2:94] [N/A:N/A] Wound Status: [1:Open] [2:Open] [N/A:N/A] Clustered  Wound: [1:No] [2:Yes] [N/A:N/A] Measurements L x W x D [1:7x6.4x0.2] [2:4x3.2x0.1] [N/A:N/A] (cm) Area (cm) : [1:35.186] [2:10.053] [N/A:N/A] Volume (cm) : [1:7.037] [2:1.005] [N/A:N/A] % Reduction in Area: [1:-103.60%] [2:12.90%] [N/A:N/A] % Reduction in Volume: [1:-35.70%] [2:13.00%] [N/A:N/A] Classification: [1:Full Thickness Without Exposed Support Structures] [2:Full Thickness Without Exposed Support Structures] [N/A:N/A] Exudate Amount: [1:Medium] [2:Medium] [N/A:N/A] Exudate Type: [1:Serosanguineous] [2:Serous] [N/A:N/A] Exudate Color: [1:red, brown] [2:amber] [N/A:N/A] Wound Margin: [1:Distinct, outline attached] [2:Distinct, outline attached] [N/A:N/A] Granulation Amount: [1:Small (1-33%)] [2:Medium (34-66%)] [N/A:N/A] Granulation Quality: [1:Red] [2:Red] [N/A:N/A] Necrotic Amount: [1:Large (67-100%)] [2:Medium (34-66%)] [N/A:N/A] Exposed Structures: [1:Fat Layer  (Subcutaneous Tissue) Exposed: Yes Fascia: No Tendon: No Muscle: No Joint: No Bone: No] [2:Fat Layer (Subcutaneous Tissue) Exposed: Yes Fascia: No Tendon: No Muscle: No Joint: No Bone: No] [N/A:N/A] Epithelialization: [1:Small (1-33%)] [2:None] [N/A:N/A] Periwound Skin Texture: [1:Excoriation: No Induration: No Callus: No] [2:Excoriation: No Induration: No Callus: No] [N/A:N/A] Crepitus: No Crepitus: No Rash: No Rash: No Scarring: No Scarring: No Periwound Skin Moisture: Maceration: No Maceration: No N/A Dry/Scaly: No Dry/Scaly: No Periwound Skin Color: Erythema: Yes Erythema: Yes N/A Atrophie Blanche: No Atrophie Blanche: No Cyanosis: No Cyanosis: No Ecchymosis: No Ecchymosis: No Hemosiderin Staining: No Hemosiderin Staining: No Mottled: No Mottled: No Pallor: No Pallor: No Rubor: No Rubor: No Erythema Location: Circumferential Circumferential N/A Temperature: No Abnormality N/A N/A Tenderness on Palpation: Yes No N/A Wound Preparation: Ulcer Cleansing: Ulcer Cleansing: N/A Rinsed/Irrigated with Saline Rinsed/Irrigated with Saline Topical Anesthetic Applied: Topical Anesthetic Applied: Other: lidocaine 4% Other: lidocaine 4% Treatment Notes Electronic Signature(s) Signed: 11/20/2017 4:37:47 PM By: Alejandro Mulling Entered By: Alejandro Mulling on 11/19/2017 08:24:31 [name] ([id number]) -------------------------------------------------------------------------------- Multi-Disciplinary Care Plan Details Patient Name: [name] Date of Service: 11/19/2017 8:00 AM Medical Record Number: [id number] Patient Account Number: [id number] Date of Birth/Sex: Aug 08, 1946 (72 y.o. M) Treating RN: [name] Primary Care Chaise Passarella: [name] Other Clinician: Referring Anthony Roland: [name] Treating Oliverio Cho/Extender: STONE III, HOYT Weeks in Treatment: 96 Active Inactive ` Venous Leg Ulcer Nursing Diagnoses: Knowledge deficit related to  disease process and management Goals: Patient will maintain optimal edema control Date Initiated: 04/03/2016 Target Resolution Date: 11/24/2016 Goal Status: Active Interventions: Compression as ordered Treatment Activities: Therapeutic compression applied : 04/03/2016 Notes: ` Wound/Skin Impairment Nursing Diagnoses: Impaired tissue integrity Goals: Ulcer/skin breakdown will heal within 14 weeks Date Initiated: 01/17/2016 Target Resolution Date: 11/24/2016 Goal Status: Active Interventions: Assess ulceration(s) every visit Notes: Electronic Signature(s) Signed: 11/20/2017 4:37:47 PM By: Alejandro Mulling Entered By: Alejandro Mulling on 11/19/2017 08:23:58 [name] ([id number]) -------------------------------------------------------------------------------- Pain Assessment Details Patient Name: [name] Date of Service: 11/19/2017 8:00 AM Medical Record Number: [id number] Patient Account Number: [id number] Date of Birth/Sex: 21-Jul-1946 (72 y.o. M) Treating RN: Renne Crigler Primary Care Idriss Quackenbush: [name] Other Clinician: Referring Elohim Brune: [name] Treating Tammara Massing/Extender: STONE III, HOYT Weeks in Treatment: 96 Active Problems Location of Pain Severity and Description of Pain Patient Has Paino No Site Locations Pain Management and Medication Current Pain Management: Electronic Signature(s) Signed: 11/20/2017 7:46:43 AM By: Renne Crigler Entered By: Renne Crigler on 11/19/2017 08:10:40 [name] ([id number]) -------------------------------------------------------------------------------- Patient/Caregiver Education Details Patient Name: [name] Date of Service: 11/19/2017 8:00 AM Medical Record Number: [id number] Patient Account Number: [id number] Date of Birth/Gender: February 29, 1946 (72 y.o. M) Treating RN: Curtis Sites Primary Care Physician: [name] Other Clinician: Referring Physician: [name] Treating Physician/Extender: Linwood Dibbles, HOYT Weeks in Treatment: 48 Education Assessment Education Provided To: Patient Education Topics Provided Wound/Skin Impairment: Handouts: Other:  wound care as ordered Methods: Demonstration, Explain/Verbal Responses: State content correctly Electronic Signature(s) Signed: 11/19/2017 4:44:49 PM By: Curtis Sites Entered By: Curtis Sites on 11/19/2017 09:40:46 [name] ([id number]) -------------------------------------------------------------------------------- Wound Assessment Details Patient Name: [name] Date of Service: 11/19/2017 8:00 AM Medical Record Number: [id number] Patient Account Number: [id number] Date of Birth/Sex: Sep 02, 1945 (72 y.o. M) Treating RN: Renne Crigler Primary Care Kitzia Camus: [name] Other Clinician: Referring Tannon Peerson: [name] Treating Mylee Falin/Extender: STONE III, HOYT Weeks in Treatment: 96 Wound Status Wound Number: 1 Primary Venous Leg Ulcer Etiology: Wound Location: Right Malleolus - Lateral Wound Open Wounding Event: Gradually Appeared Status: Date Acquired: 08/11/2015 Comorbid Cataracts, Chronic Obstructive Pulmonary Weeks Of Treatment: 96 History: Disease (COPD), Osteoarthritis Clustered Wound: No Photos Photo Uploaded By: Renne Crigler on 11/20/2017 07:48:13 Wound Measurements Length: (cm) 7 Width: (cm) 6.4 Depth: (cm) 0.2 Area: (cm) 35.186 Volume: (cm) 7.037 % Reduction in Area: -103.6% % Reduction in Volume: -35.7% Epithelialization: Small (1-33%) Tunneling: No Undermining: No Wound Description Full Thickness Without Exposed Support Classification: Structures Wound Margin: Distinct, outline attached Exudate Medium Amount: Exudate Type: Serosanguineous Exudate Color: red, brown Foul Odor After Cleansing: No Slough/Fibrino Yes Wound Bed Granulation Amount: Small (1-33%) Exposed Structure Granulation Quality: Red Fascia Exposed:  No Necrotic Amount: Large (67-100%) Fat Layer (Subcutaneous Tissue) Exposed: Yes Necrotic Quality: Adherent Slough Tendon Exposed: No Muscle Exposed: No Joint Exposed: No Bone Exposed: No [name], [name] ([id number]) Periwound Skin Texture Texture Color No Abnormalities Noted: No No Abnormalities Noted: No Callus: No Atrophie Blanche: No Crepitus: No Cyanosis: No Excoriation: No Ecchymosis: No Induration: No Erythema: Yes Rash: No Erythema Location: Circumferential Scarring: No Hemosiderin Staining: No Mottled: No Moisture Pallor: No No Abnormalities Noted: No Rubor: No Dry / Scaly: No Maceration: No Temperature / Pain Temperature: No Abnormality Tenderness on Palpation: Yes Wound Preparation Ulcer Cleansing: Rinsed/Irrigated with Saline Topical Anesthetic Applied: Other: lidocaine 4%, Treatment Notes Wound #1 (Right, Lateral Malleolus) 1. Cleansed with: Clean wound with Normal Saline 2. Anesthetic Topical Lidocaine 4% cream to wound bed prior to debridement 4. Dressing Applied: Prisma Ag Other dressing (specify in notes) 5. Secondary Dressing Applied ABD Pad Kerlix/Conform Notes gentamycin cream, xtrasorb, kerlix and offloading shoe Electronic Signature(s) Signed: 11/20/2017 7:46:43 AM By: Renne Crigler Entered By: Renne Crigler on 11/19/2017 08:17:18 [name] ([id number]) -------------------------------------------------------------------------------- Wound Assessment Details Patient Name: [name] Date of Service: 11/19/2017 8:00 AM Medical Record Number: [id number] Patient Account Number: [id number] Date of Birth/Sex: [birth year] (72 y.o. M) Treating RN: Renne Crigler Primary Care Jabaree Mercado: [name] Other Clinician: Referring Carlise Stofer: [name] Treating Dream Nodal/Extender: STONE III, HOYT Weeks in Treatment: 96 Wound Status Wound Number: 2 Primary Venous Leg Ulcer Etiology: Wound Location: Right Malleolus  - Medial Wound Open Wounding Event: Gradually Appeared Status: Date Acquired: 01/30/2016 Comorbid Cataracts, Chronic Obstructive Pulmonary Weeks Of Treatment: 94 History: Disease (COPD), Osteoarthritis Clustered Wound: Yes Photos Photo Uploaded By: Renne Crigler on 11/20/2017 07:48:13 Wound Measurements Length: (cm) 4 Width: (cm) 3.2 Depth: (cm) 0.1 Area: (cm) 10.053 Volume: (cm) 1.005 % Reduction in Area: 12.9% % Reduction in Volume: 13% Epithelialization: None Tunneling: No Undermining: No Wound Description Full Thickness Without Exposed Support Classification: Structures Wound Margin: Distinct, outline attached Exudate Medium Amount: Exudate Type: Serous Exudate Color: amber Foul Odor After Cleansing: No Slough/Fibrino Yes Wound Bed Granulation Amount: Medium (34-66%) Exposed Structure Granulation Quality: Red Fascia Exposed: No Necrotic Amount: Medium (34-66%) Fat Layer (Subcutaneous Tissue) Exposed: Yes Necrotic Quality: Adherent Slough Tendon Exposed: No Muscle Exposed:  No Joint Exposed: No Bone Exposed: No [name], [name] ([id number]) Periwound Skin Texture Texture Color No Abnormalities Noted: No No Abnormalities Noted: No Callus: No Atrophie Blanche: No Crepitus: No Cyanosis: No Excoriation: No Ecchymosis: No Induration: No Erythema: Yes Rash: No Erythema Location: Circumferential Scarring: No Hemosiderin Staining: No Mottled: No Moisture Pallor: No No Abnormalities Noted: No Rubor: No Dry / Scaly: No Maceration: No Wound Preparation Ulcer Cleansing: Rinsed/Irrigated with Saline Topical Anesthetic Applied: Other: lidocaine 4%, Treatment Notes Wound #2 (Right, Medial Malleolus) 1. Cleansed with: Clean wound with Normal Saline 2. Anesthetic Topical Lidocaine 4% cream to wound bed prior to debridement 4. Dressing Applied: Prisma Ag Other dressing (specify in notes) 5. Secondary Dressing Applied ABD  Pad Kerlix/Conform Notes gentamycin cream, xtrasorb, kerlix and offloading shoe Electronic Signature(s) Signed: 11/20/2017 7:46:43 AM By: Renne CriglerFlinchum, [name] Entered By: Renne CriglerFlinchum, [name] on 11/19/2017 08:18:06 [name] StackBLACKWELL, [name] ([id number]) -------------------------------------------------------------------------------- Vitals Details Patient Name: [name] StackBLACKWELL, [name] Date of Service: 11/19/2017 8:00 AM Medical Record Number: [id number] Patient Account Number: [id number] Date of Birth/Sex: 18-Mar-1946 [age] y.o. M) Treating RN: Renne CriglerFlinchum, [name] Primary Care Mac Dowdell: Darreld McleanMILES, LINDA Other Clinician: Referring Damita Eppard: Darreld McleanMILES, LINDA Treating Fallen Crisostomo/Extender: STONE III, HOYT Weeks in Treatment: 96 Vital Signs Time Taken: 08:10 Temperature (F): 98.1 Height (in): 76 Pulse (bpm): 78 Weight (lbs): 238 Respiratory Rate (breaths/min): 16 Body Mass Index (BMI): 29 Blood Pressure (mmHg): 151/65 Reference Range: 80 - 120 mg / dl Electronic Signature(s) Signed: 11/20/2017 7:46:43 AM By: Renne CriglerFlinchum, [name] Entered By: Renne CriglerFlinchum, [name] on 11/19/2017 08:13:33

## 2017-11-26 ENCOUNTER — Encounter: Payer: Medicare HMO | Attending: Physician Assistant | Admitting: Physician Assistant

## 2017-11-26 DIAGNOSIS — I87331 Chronic venous hypertension (idiopathic) with ulcer and inflammation of right lower extremity: Secondary | ICD-10-CM | POA: Diagnosis not present

## 2017-11-26 DIAGNOSIS — L97312 Non-pressure chronic ulcer of right ankle with fat layer exposed: Secondary | ICD-10-CM | POA: Diagnosis not present

## 2017-11-26 DIAGNOSIS — Z95828 Presence of other vascular implants and grafts: Secondary | ICD-10-CM | POA: Insufficient documentation

## 2017-11-26 DIAGNOSIS — J449 Chronic obstructive pulmonary disease, unspecified: Secondary | ICD-10-CM | POA: Insufficient documentation

## 2017-11-26 DIAGNOSIS — M1990 Unspecified osteoarthritis, unspecified site: Secondary | ICD-10-CM | POA: Diagnosis not present

## 2017-11-26 DIAGNOSIS — Z86718 Personal history of other venous thrombosis and embolism: Secondary | ICD-10-CM | POA: Diagnosis not present

## 2017-11-26 DIAGNOSIS — Z7901 Long term (current) use of anticoagulants: Secondary | ICD-10-CM | POA: Insufficient documentation

## 2017-11-28 ENCOUNTER — Ambulatory Visit
Admission: RE | Admit: 2017-11-28 | Discharge: 2017-11-28 | Disposition: A | Discharge status: Home / Self Care | Payer: Medicare HMO | Source: Ambulatory Visit | Attending: Physician Assistant | Admitting: Physician Assistant

## 2017-11-28 DIAGNOSIS — L97919 Non-pressure chronic ulcer of unspecified part of right lower leg with unspecified severity: Secondary | ICD-10-CM | POA: Insufficient documentation

## 2017-11-28 DIAGNOSIS — I87331 Chronic venous hypertension (idiopathic) with ulcer and inflammation of right lower extremity: Secondary | ICD-10-CM | POA: Insufficient documentation

## 2017-11-29 NOTE — Progress Notes (Signed)
NAZARIO, RUSSOM ([id number]) Visit Report for 11/26/2017 Arrival Information Details Patient Name: [name], [name]. Date of Service: 11/26/2017 8:00 AM Medical Record Number: [id number] Patient Account Number: [id number] Date of Birth/Sex: 03/16/1946 (72 y.o. M) Treating RN: Renne Crigler Primary Care Zea Kostka: [name] Other Clinician: Referring Chetan Mehring: [name] Treating Kenae Lindquist/Extender: Linwood Dibbles, HOYT Weeks in Treatment: 97 Visit Information History Since Last Visit All ordered tests and consults were completed: No Patient Arrived: Ambulatory Added or deleted any medications: No Arrival Time: 08:05 Any new allergies or adverse reactions: No Accompanied By: self Had a fall or experienced change in No Transfer Assistance: None activities of daily living that may affect Patient Identification Verified: Yes risk of falls: Secondary Verification Process Yes Signs or symptoms of abuse/neglect since last visito No Completed: Hospitalized since last visit: No Patient Requires Transmission-Based No Implantable device outside of the clinic excluding No Precautions: cellular tissue based products placed in the center Patient Has Alerts: Yes since last visit: Patient Alerts: Patient on Blood Pain Present Now: No Thinner Electronic Signature(s) Signed: 11/26/2017 3:09:28 PM By: Renne Crigler Entered By: Renne Crigler on 11/26/2017 08:06:15 [name] ([id number]) -------------------------------------------------------------------------------- Encounter Discharge Information Details Patient Name: [name] Date of Service: 11/26/2017 8:00 AM Medical Record Number: [id number] Patient Account Number: [id number] Date of Birth/Sex: November 26, 1945 (72 y.o. M) Treating RN: [name] Primary Care Manasvini Whatley: [name] Other Clinician: Referring Jimmi Sidener: [name] Treating Al Gagen/Extender: STONE III, HOYT Weeks in Treatment: 18 Encounter  Discharge Information Items Discharge Pain Level: 0 Discharge Condition: Stable Ambulatory Status: Ambulatory Discharge Destination: Home Transportation: Private Auto Schedule Follow-up Appointment: Yes Medication Reconciliation completed and No provided to Patient/Care Cipriana Biller: Patient Clinical Summary of Care: Declined Electronic Signature(s) Signed: 11/26/2017 3:09:28 PM By: Renne Crigler Entered By: Renne Crigler on 11/26/2017 08:45:37 [name] ([id number]) -------------------------------------------------------------------------------- Lower Extremity Assessment Details Patient Name: [name] Date of Service: 11/26/2017 8:00 AM Medical Record Number: [id number] Patient Account Number: [id number] Date of Birth/Sex: 07/29/1946 (72 y.o. M) Treating RN: Renne Crigler Primary Care Saabir Blyth: [name] Other Clinician: Referring Noemi Ishmael: [name] Treating Janille Draughon/Extender: STONE III, HOYT Weeks in Treatment: 97 Edema Assessment Assessed: [Left: No] [Right: No] Edema: [Left: N] [Right: o] Vascular Assessment Claudication: Claudication Assessment [Right:None] Pulses: Dorsalis Pedis Palpable: [Right:Yes] Posterior Tibial Extremity colors, hair growth, and conditions: Extremity Color: [Right:Normal] Hair Growth on Extremity: [Right:No] Temperature of Extremity: [Right:Warm] Capillary Refill: [Right:< 3 seconds] Toe Nail Assessment Left: Right: Thick: Yes Discolored: Yes Deformed: No Improper Length and Hygiene: Yes Electronic Signature(s) Signed: 11/26/2017 3:09:28 PM By: Renne Crigler Entered By: Renne Crigler on 11/26/2017 08:13:49 [name] ([id number]) -------------------------------------------------------------------------------- Multi Wound Chart Details Patient Name: [name] Date of Service: 11/26/2017 8:00 AM Medical Record Number: [id number] Patient Account Number: [id number] Date of Birth/Sex:  25-Nov-1945 (72 y.o. M) Treating RN: [name] Primary Care Dianara Smullen: [name] Other Clinician: Referring Kambri Dismore: [name] Treating Brynnlee Cumpian/Extender: STONE III, HOYT Weeks in Treatment: 97 Vital Signs Height(in): 76 Pulse(bpm): 68 Weight(lbs): 238 Blood Pressure(mmHg): 130/61 Body Mass Index(BMI): 29 Temperature(F): 98.1 Respiratory Rate 18 (breaths/min): Photos: [1:No Photos] [2:No Photos] [N/A:N/A] Wound Location: [1:Right Malleolus - Lateral] [2:Right Malleolus - Medial] [N/A:N/A] Wounding Event: [1:Gradually Appeared] [2:Gradually Appeared] [N/A:N/A] Primary Etiology: [1:Venous Leg Ulcer] [2:Venous Leg Ulcer] [N/A:N/A] Comorbid History: [1:Cataracts, Chronic Obstructive Cataracts, Chronic Obstructive N/A Pulmonary Disease (COPD), Pulmonary Disease (COPD), Osteoarthritis] [2:Osteoarthritis] Date Acquired: [1:08/11/2015] [2:01/30/2016] [N/A:N/A] Weeks of Treatment: [1:97] [2:95] [N/A:N/A] Wound Status: [1:Open] [2:Open] [N/A:N/A] Clustered Wound: [1:No] [2:Yes] [  N/A:N/A] Measurements L x W x D [1:6.5x5.8x0.2] [2:4x3.4x0.1] [N/A:N/A] (cm) Area (cm) : [1:29.61] [2:10.681] [N/A:N/A] Volume (cm) : [1:5.922] [2:1.068] [N/A:N/A] % Reduction in Area: [1:-71.40%] [2:7.50%] [N/A:N/A] % Reduction in Volume: [1:-14.20%] [2:7.50%] [N/A:N/A] Classification: [1:Full Thickness Without Exposed Support Structures] [2:Full Thickness Without Exposed Support Structures] [N/A:N/A] Exudate Amount: [1:Large] [2:Medium] [N/A:N/A] Exudate Type: [1:Serosanguineous] [2:Serous] [N/A:N/A] Exudate Color: [1:red, brown] [2:amber] [N/A:N/A] Wound Margin: [1:Distinct, outline attached] [2:Distinct, outline attached] [N/A:N/A] Granulation Amount: [1:Small (1-33%)] [2:Medium (34-66%)] [N/A:N/A] Granulation Quality: [1:Red] [2:Red] [N/A:N/A] Necrotic Amount: [1:Large (67-100%)] [2:Medium (34-66%)] [N/A:N/A] Exposed Structures: [1:Fat Layer (Subcutaneous Tissue) Exposed: Yes Fascia: No  Tendon: No Muscle: No Joint: No Bone: No] [2:Fat Layer (Subcutaneous Tissue) Exposed: Yes Fascia: No Tendon: No Muscle: No Joint: No Bone: No] [N/A:N/A] Epithelialization: [1:Small (1-33%)] [2:None] [N/A:N/A] Periwound Skin Texture: [1:Excoriation: No Induration: No Callus: No] [2:Excoriation: No Induration: No Callus: No] [N/A:N/A] Crepitus: No Crepitus: No Rash: No Rash: No Scarring: No Scarring: No Periwound Skin Moisture: Maceration: No Maceration: No N/A Dry/Scaly: No Dry/Scaly: No Periwound Skin Color: Atrophie Blanche: No Atrophie Blanche: No N/A Cyanosis: No Cyanosis: No Ecchymosis: No Ecchymosis: No Erythema: No Erythema: No Hemosiderin Staining: No Hemosiderin Staining: No Mottled: No Mottled: No Pallor: No Pallor: No Rubor: No Rubor: No Temperature: No Abnormality No Abnormality N/A Tenderness on Palpation: Yes Yes N/A Wound Preparation: Ulcer Cleansing: Ulcer Cleansing: N/A Rinsed/Irrigated with Saline Rinsed/Irrigated with Saline Topical Anesthetic Applied: Topical Anesthetic Applied: Other: lidocaine 4% Other: lidocaine 4% Treatment Notes Electronic Signature(s) Signed: 11/28/2017 4:32:30 PM By: Alejandro Mulling Entered By: Alejandro Mulling on 11/26/2017 08:21:17 [name] ([id number]) -------------------------------------------------------------------------------- Multi-Disciplinary Care Plan Details Patient Name: [name] Date of Service: 11/26/2017 8:00 AM Medical Record Number: [id number] Patient Account Number: [id number] Date of Birth/Sex: Jun 03, 1946 (72 y.o. M) Treating RN: [name] Primary Care Angelissa Supan: [name] Other Clinician: Referring Mavryk Pino: [name] Treating Gerilynn Mccullars/Extender: STONE III, HOYT Weeks in Treatment: 30 Active Inactive ` Venous Leg Ulcer Nursing Diagnoses: Knowledge deficit related to disease process and management Goals: Patient will maintain optimal edema control Date Initiated:  04/03/2016 Target Resolution Date: 11/24/2016 Goal Status: Active Interventions: Compression as ordered Treatment Activities: Therapeutic compression applied : 04/03/2016 Notes: ` Wound/Skin Impairment Nursing Diagnoses: Impaired tissue integrity Goals: Ulcer/skin breakdown will heal within 14 weeks Date Initiated: 01/17/2016 Target Resolution Date: 11/24/2016 Goal Status: Active Interventions: Assess ulceration(s) every visit Notes: Electronic Signature(s) Signed: 11/28/2017 4:32:30 PM By: Alejandro Mulling Entered By: Alejandro Mulling on 11/26/2017 08:20:52 [name] ([id number]) -------------------------------------------------------------------------------- Pain Assessment Details Patient Name: [name] Date of Service: 11/26/2017 8:00 AM Medical Record Number: [id number] Patient Account Number: [id number] Date of Birth/Sex: [birth year] (72 y.o. M) Treating RN: Renne Crigler Primary Care Oluwatobiloba Martin: [name] Other Clinician: Referring Kathaleen Dudziak: [name] Treating Garielle Mroz/Extender: STONE III, HOYT Weeks in Treatment: 20 Active Problems Location of Pain Severity and Description of Pain Patient Has Paino No Site Locations Pain Management and Medication Current Pain Management: Electronic Signature(s) Signed: 11/26/2017 3:09:28 PM By: Renne Crigler Entered By: Renne Crigler on 11/26/2017 08:06:25 [name] ([id number]) -------------------------------------------------------------------------------- Patient/Caregiver Education Details Patient Name: [name] Date of Service: 11/26/2017 8:00 AM Medical Record Number: [id number] Patient Account Number: [id number] Date of Birth/Gender: 01-23-1946 (72 y.o. M) Treating RN: Renne Crigler Primary Care Physician: [name] Other Clinician: Referring Physician: [name] Treating Physician/Extender: Linwood Dibbles, HOYT Weeks in Treatment: 84 Education Assessment Education  Provided To: Patient Education Topics Provided Wound Debridement: Handouts: Wound Debridement Methods: Explain/Verbal Responses: State content correctly  Wound/Skin Impairment: Handouts: Caring for Your Ulcer Methods: Explain/Verbal Responses: State content correctly Electronic Signature(s) Signed: 11/26/2017 3:09:28 PM By: Renne CriglerFlinchum, Cheryl Entered By: Renne CriglerFlinchum, Cheryl on 11/26/2017 08:45:53 [name] StackBLACKWELL, [name] ([id number]) -------------------------------------------------------------------------------- Wound Assessment Details Patient Name: [name] StackBLACKWELL, [name] Date of Service: 11/26/2017 8:00 AM Medical Record Number: [id number] Patient Account Number: [id number] Date of Birth/Sex: 29-Jul-1946 15(71 y.o. M) Treating RN: Renne CriglerFlinchum, Cheryl Primary Care Cuma Polyakov: Darreld McleanMILES, LINDA Other Clinician: Referring Jahiem Franzoni: Darreld McleanMILES, LINDA Treating Manpreet Kemmer/Extender: STONE III, HOYT Weeks in Treatment: 97 Wound Status Wound Number: 1 Primary Venous Leg Ulcer Etiology: Wound Location: Right Malleolus - Lateral Wound Open Wounding Event: Gradually Appeared Status: Date Acquired: 08/11/2015 Comorbid Cataracts, Chronic Obstructive Pulmonary Weeks Of Treatment: 97 History: Disease (COPD), Osteoarthritis Clustered Wound: No Photos Photo Uploaded By: Renne CriglerFlinchum, Cheryl on 11/26/2017 10:51:47 Wound Measurements Length: (cm) 6.5 Width: (cm) 5.8 Depth: (cm) 0.2 Area: (cm) 29.61 Volume: (cm) 5.922 % Reduction in Area: -71.4% % Reduction in Volume: -14.2% Epithelialization: Small (1-33%) Tunneling: No Undermining: No Wound Description Full Thickness Without Exposed Support Classification: Structures Wound Margin: Distinct, outline attached Exudate Large Amount: Exudate Type: Serosanguineous Exudate Color: red, brown Foul Odor After Cleansing: No Slough/Fibrino Yes Wound Bed Granulation Amount: Small (1-33%) Exposed Structure Granulation Quality: Red Fascia Exposed: No Necrotic Amount:  Large (67-100%) Fat Layer (Subcutaneous Tissue) Exposed: Yes Necrotic Quality: Adherent Slough Tendon Exposed: No Muscle Exposed: No Joint Exposed: No Bone Exposed: No Tremont, [name] ([id number]) Periwound Skin Texture Texture Color No Abnormalities Noted: No No Abnormalities Noted: No Callus: No Atrophie Blanche: No Crepitus: No Cyanosis: No Excoriation: No Ecchymosis: No Induration: No Erythema: No Rash: No Hemosiderin Staining: No Scarring: No Mottled: No Pallor: No Moisture Rubor: No No Abnormalities Noted: No Dry / Scaly: No Temperature / Pain Maceration: No Temperature: No Abnormality Tenderness on Palpation: Yes Wound Preparation Ulcer Cleansing: Rinsed/Irrigated with Saline Topical Anesthetic Applied: Other: lidocaine 4%, Treatment Notes Wound #1 (Right, Lateral Malleolus) 1. Cleansed with: Clean wound with Normal Saline 2. Anesthetic Topical Lidocaine 4% cream to wound bed prior to debridement 3. Peri-wound Care: Barrier cream Other peri-wound care (specify in notes) 4. Dressing Applied: Prisma Ag 5. Secondary Dressing Applied ABD Pad Notes gentamycin to wound bed and cover with prisma ag, xtrasorb, kerlix wrap and tape with darko offloading shoe Electronic Signature(s) Signed: 11/26/2017 3:09:28 PM By: Renne CriglerFlinchum, Cheryl Entered By: Renne CriglerFlinchum, Cheryl on 11/26/2017 08:12:42 [name] StackBLACKWELL, [name] ([id number]) -------------------------------------------------------------------------------- Wound Assessment Details Patient Name: [name] StackBLACKWELL, [name] Date of Service: 11/26/2017 8:00 AM Medical Record Number: [id number] Patient Account Number: [id number] Date of Birth/Sex: 29-Jul-1946 [age] y.o. M) Treating RN: Renne CriglerFlinchum, Cheryl Primary Care Saundra Gin: Darreld McleanMILES, LINDA Other Clinician: Referring Andersyn Fragoso: Darreld McleanMILES, LINDA Treating Emil Weigold/Extender: STONE III, HOYT Weeks in Treatment: 97 Wound Status Wound Number: 2 Primary Venous Leg Ulcer Etiology: Wound  Location: Right Malleolus - Medial Wound Open Wounding Event: Gradually Appeared Status: Date Acquired: 01/30/2016 Comorbid Cataracts, Chronic Obstructive Pulmonary Weeks Of Treatment: 95 History: Disease (COPD), Osteoarthritis Clustered Wound: Yes Photos Photo Uploaded By: Renne CriglerFlinchum, Cheryl on 11/26/2017 10:51:49 Wound Measurements Length: (cm) 4 Width: (cm) 3.4 Depth: (cm) 0.1 Area: (cm) 10.681 Volume: (cm) 1.068 % Reduction in Area: 7.5% % Reduction in Volume: 7.5% Epithelialization: None Tunneling: No Undermining: No Wound Description Full Thickness Without Exposed Support Classification: Structures Wound Margin: Distinct, outline attached Exudate Medium Amount: Exudate Type: Serous Exudate Color: amber Foul Odor After Cleansing: No Slough/Fibrino Yes Wound Bed Granulation Amount: Medium (34-66%) Exposed Structure Granulation Quality: Red Fascia Exposed: No Necrotic Amount: Medium (34-66%)  Fat Layer (Subcutaneous Tissue) Exposed: Yes Necrotic Quality: Adherent Slough Tendon Exposed: No Muscle Exposed: No Joint Exposed: No Bone Exposed: No [name], [name] Periwound Skin Texture Texture Color No Abnormalities Noted: No No Abnormalities Noted: No Callus: No Atrophie Blanche: No Crepitus: No Cyanosis: No Excoriation: No Ecchymosis: No Induration: No Erythema: No Rash: No Hemosiderin Staining: No Scarring: No Mottled: No Pallor: No Moisture Rubor: No No Abnormalities Noted: No Dry / Scaly: No Temperature / Pain Maceration: No Temperature: No Abnormality Tenderness on Palpation: Yes Wound Preparation Ulcer Cleansing: Rinsed/Irrigated with Saline Topical Anesthetic Applied: Other: lidocaine 4%, Treatment Notes Wound #2 (Right, Medial Malleolus) 1. Cleansed with: Clean wound with Normal Saline 2. Anesthetic Topical Lidocaine 4% cream to wound bed prior to debridement 3. Peri-wound Care: Barrier cream Other peri-wound care  (specify in notes) 4. Dressing Applied: Prisma Ag 5. Secondary Dressing Applied ABD Pad Notes gentamycin to wound bed and cover with prisma ag, xtrasorb, kerlix wrap and tape with darko offloading shoe Electronic Signature(s) Signed: 11/26/2017 3:09:28 PM By: Renne Crigler Entered By: Renne Crigler on 11/26/2017 08:13:18 [name] ([id number]) -------------------------------------------------------------------------------- Vitals Details Patient Name: [name] Date of Service: 11/26/2017 8:00 AM Medical Record Number: [id number] Patient Account Number: [id number] Date of Birth/Sex: December 15, 1945 (72 y.o. M) Treating RN: Renne Crigler Primary Care Danyah Guastella: [name] Other Clinician: Referring Morelia Cassells: [name] Treating Aloysius Heinle/Extender: STONE III, HOYT Weeks in Treatment: 97 Vital Signs Time Taken: 08:06 Temperature (F): 98.1 Height (in): 76 Pulse (bpm): 68 Weight (lbs): 238 Respiratory Rate (breaths/min): 18 Body Mass Index (BMI): 29 Blood Pressure (mmHg): 130/61 Reference Range: 80 - 120 mg / dl Electronic Signature(s) Signed: 11/26/2017 3:09:28 PM By: Renne Crigler Entered By: Renne Crigler on 11/26/2017 08:08:34

## 2017-11-29 NOTE — Progress Notes (Signed)
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 on doxycycline he does not have blisters elsewhere on his body. I do not  believe this is a drug allergy. also mentioned that there was a copious purulent discharge from the wound and clinically there is no evidence of cellulitis. 08/07/16; I note that the patient came in for his nurse check on Friday apparently with blisters on his toes on the right than a lot of swelling in his forefoot. He continued on the doxycycline that I had prescribed on 12/8. A culture was done of the lateral wound that showed a combination of a few Proteus and Pseudomonas. Doxycycline might of covered the  Proteus but would be unlikely to cover the Pseudomonas. He is on Coumadin. He arrives in the clinic today feeling a lot better states the pain is a lot better but nothing specific really was done other than to rewrap the foot also noted that he had arterial studies ordered in August although these were never done. It is reasonable to go ahead and reorder these. 08/14/16; generally arrives in a better state today in terms of the wounds he has taken cefdinir for one week. Our intake nurse reports copious amounts of drainage but the patient is complaining of much less pain. He is not had his PT and INR checked and I've asked him to do this today or tomorrow. 08/24/2016 -- patient arrives today after 10 days and said he had a stomach upset. His arterial study was done and I have reviewed this report and find it to be within normal limits. However I did not note any venous duplex studies for reflux, and Dr. Leanord Hawking may have ordered these in the past but I will leave it to him to decide if he needs these. The patient has finished his course of cefdinir. 08/28/16; patient arrives today again with copious amounts of thick really green drainage for our intake nurse. He states he has a very tender spot at the superior part of the lateral wound. Wounds are larger 09/04/16; no real change in the condition of this patient's wound still copious amounts of surface slough. Started him on Iodoflex last week he is  completing another course of Cefdinir or which I think was done empirically. His arterial study showed ABIs were 1.1 on the right 1.5 on the left. He did have a slightly reduced ABI in the right the left one was not obtained. Had calcification of the right posterior tibial artery. The interpretation was no segmental stenosis. His waveforms were triphasic. [name], [name] ([id number]) His reflux studies are later this month. Depending on this I'll send him for a vascular consultation, he may need to see plastic surgery as I believe he is had plastic surgery on this foot in the past. He had an injury to the foot in the 1980s. 1/16 /18 right lateral greater than right medial ankle wounds on the right in the setting of previous skin grafting. 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 He does not have arterial issues. Each week he comes in with the same adherent surface slough although there was less of this today 09/18/16; right lateral greater than right medial lower extremity wounds in the setting of previous skin grafting and trauma. He has least to vein laser ablation scheduled for February 2 for venous reflux. He does not have significant arterial disease. Problem has been very difficult to handle surface slough/necrotic tissue. Recently using Iodoflex for this with some, albeit slow improvement 09/25/16; right lateral greater than right medial lower extremity venous wounds in the setting of previous skin grafting. He is going for ablation surgery on February 2 after this he'll come back here for rewrap. He has been using Iodoflex as the primary dressing. 10/02/16; right lateral greater than right medial lower extremity wounds in the setting of previous skin grafting. He had his ablation surgery last week, I don't have a report. He tolerated this well. Came in with a thigh-high Unna boots on Friday. We have been  using Iodoflex as the primary dressing. His measurements are  improving 10/09/16; continues to make nice aggressive in terms of the wounds on his lateral and medial right ankle in the setting of previous skin grafting. Yesterday he noticed drainage at one of his surgical sites from his venous ablation on the right calf. He took off the bandage over this area felt a "popping" sensation and a reddish-brown drainage. He is not complaining of any pain 10/16/16; he continues to make nice progression in terms of the wounds on the lateral and medial malleolus. Both smaller using Iodoflex. He had a surgical area in his posterior mid calf we have been using iodoform. All the wounds are down and dimensions 10/23/16; the patient arrives today with no complaints. He states the Iodoflex is a bit uncomfortable. He is not systemically unwell. We have been using Iodoflex to the lateral right ankle and the medial and Aquacel Ag to the reflux surgical wound on the posterior right calf. All of these wounds are doing well 10/30/16; patient states he has no pain no systemic symptoms. I changed him to [facility] last week. Although the wounds are doing well 11/06/16; patient reports no pain or systemic symptoms. We continue with Hydrofera Blue. Both wound areas on the medial and lateral ankle appear to be doing well with improvement and dimensions and improvement in the wound bed. 11/13/16; patient's dimensions continued to improve. We continue with Hydrofera Blue on the medial and lateral side. Appear to be doing well with healthy granulation and advancing epithelialization 11/20/16; patient's dimensions improving laterally by about half a centimeter in length. Otherwise no change on the medial side. Using [facility] 12/04/16; no major change in patient's wound dimensions. Intake nurse reports more drainage. 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 He is voicing no complaints or concerns. He has been tolerating his twice weekly compression therapy changes 12/11/16 Patient complains of pain and increased drainage.. wants hydrofera blue 12/18/16 improvement. Sorbact 12/25/16; medial wound is smaller, lateral measures the same. Still on sorbact 01/01/17; medial wound continues to be smaller, lateral measures about the same however there is clearly advancing epithelialization here as well in fact I think the wound will ultimately divided into 2 open areas 01/08/17; unfortunately today fairly significant regression in several areas. Surface of the lateral wound covered again in adherent necrotic material which is difficult to debridement. He has significant surrounding skin maceration. The expanding area of tissue epithelialization in the middle of the wound that was encouraging last week appears to be smaller. There is no surrounding tenderness. The area on the medial leg also did not seem to be as healthy as last week, the reason for this regression this week is not totally clear. We have been using Sorbact for the last 4 weeks. We'll switched of polymen AG which we will order via home medical supply. If there is a problem with this would switch back to Iodoflex 01/15/18; drainage,odor. No change. Switched to polymen last week 01/22/17; still continuous drainage. Culture I did last week showed a few Proteus pansensitive. I did this culture because of drainage. Put him on Augmentin which she has been taking since Saturday however he is developed 4-5 liquid bowel movements. He is also on Coumadin. Beyond this wound is not changed at all, still nonviable necrotic surface material which I debrided reveals healthy granulation line 01/29/17; still copious amounts of drainage reported by her intake nurse. Wound measuring slightly smaller. Currently  fact on Iodoflex although I'm looking forward to changing back to  perhaps [facility] or polymen AG 02/05/17; still large amounts of drainage and presenting with really large amounts of adherent slough and necrotic material over the remaining open area of the wound. We have been using Iodoflex but with little improvement in the surface. Change to [facility] 02/12/17; still large amount of drainage. Much less adherent slough however. Started [employer]	[place] last week [name], [name] ([id number]) 02/19/17; drainage is better this week. Much less adherent slough. Perhaps some improvement in dimensions. Using [facility] 02/26/17; severe venous insufficiency wounds on the right lateral and right medial leg. Drainage is some better and slough is less adherent we've been using Hydrofera Blue 03/05/17 on evaluation today patient appears to be doing well. His wounds have been decreasing in size and overall he is pleased with how this is progressing. We are awaiting approval for the epigraph which has previously been recommended in the meantime the [facility] Dressing is to be doing very well for him. 03/12/17; wound dimensions are smaller still using Hydrofera Blue. Comes in on Fridays for a dressing change. 03/19/17; wound dimensions continued to contract. Healing of this wound is complicated by continuous significant drainage as well as recurrent buildup of necrotic surface material. We looked into Apligraf and he has a $290 co-pay per application but truthfully I think the drainage as well as the nonviable surface would preclude use of Apligraf there are any other skin substitute at this point therefore the continued plan will be debridement each clinic visit, 2 times a week dressing changes and continued use of Hydrofera Blue. improvement has been very slow but sustained 03/26/17 perhaps slight improvements in peripheral epithelialization is especially inferiorly. Still with large amount of drainage and tightly adherent necrotic surface on arrival. Along  with the intake nurse I reviewed previous treatment. He worsened on Iodoflex and had a 4 week trial of sorbact, Polymen AG and a long courses of Aquacel Ag. He is not a candidate for advanced treatment options for many reasons 04/09/17 on evaluation today patient's right lower extremity wounds appear to be doing a little better. Fortunately he has no significant discomfort and has been tolerating the dressing changes including the wraps without complication. With that being said he really does not have swelling anymore compared to what he has had in the past following his vascular intervention. He wonders if potentially we could attempt avoiding the rats to see if he could cleanse the wound in between to try to prevent some of the fiber and its buildup that occurs in the interim between when we see him week to week. No fevers, chills, nausea, or vomiting noted at this time. 04/16/17; noted that the staff made a choice last week not to put him in compression. The patient is changing his dressing at home using [facility] and changing every second day. His wife is washing the wound with saline. He is using kerlix. Surprisingly he has not developed a lot of edema. This choice was made because of the degree of fluid retention and maceration even with changing his dressing twice a week 04/23/17; absolutely no change. Using Hydrofera Blue. Recurrent tightly adherent nonviable surface material. This is been refractory to Iodoflex, sorbact and now Hydrofera Blue. More recently he has been changing his own dressings at home, cleansing the wound in the shower. He has not developed lower extremity edema 04/30/17; if anything the wound is larger still in adherent surface although  it debrided easily today. I've been using Mehta honey for 1 week and I'd like to try and do it for a second week this see if we can get some form of viable surface here. 05/07/17; patient arrives with copious amounts of drainage and some  pain in the superior part of the wound. He has not been systemically unwell. He's been changing this daily at home 05/14/17; patient arrives today complaining of less drainage and less pain. Dimensions slightly better. Surface culture I did of this last week grew MSSA I put him on dicloxacillin for 10 days. Patient requesting a further prescription since he feels so much better. He did not obtain the [facility] AG for reasons that aren't clear, he has been using Hydrofera Blue 05/21/17; perhaps some less drainage and less pain. He is completing another week of doxycycline. Unfortunately there is no change in the wound measurements are appearance still tightly adherent necrotic surface material that is really defied treatment. We have been using Hydrofera Blue most recently. He did not manage to get Anasept. He was told it was prescription at [facility] 05/29/17; absolutely no improvement in these wound areas. He had remote plastic surgery with who was done at [facility] in [place] surrounding this area. This was a traumatic wound with extensive plastic surgery/skin grafting at that time. I switched him to sorbact last week to see if he can do anything about the recurrent necrotic surface and drainage. This is largely been refractory to Iodoflex/Hydrofera Blue/alginates. I believe we tried Elby Showers and more recently Medi honey l however the drainage is really too excessive 06/04/17; patient went to see Dr. Ulice Bold. Per the patient she ordered him a compression stocking. Continued the Anasept gel and sorbact was already on. No major improvement in the condition of the wounds in fact the medial wound looks larger. Again per the patient she did not feel a skin graft or operative debridement was indicated The patient had previous venous ablation in February 2018. Last arterial studies were in December 2017 and were within normal limits 06/18/17; patient arrives back in clinic today using Anasept gel  with sorbact. He changes this every day is wearing his own compression stocking. The patient actually saw Dr. Leta Baptist of plastic surgery. I've reviewed her note. The appointment was on 05/30/17. The basic issue with here was that she did not feel that skin grafts for patients with underlying stasis and edema have a good long-term success rate. She also discuss skin substitutes which has been done here as well however I have not been able to get the surface of these wounds to something that I think would support skin substitutes. This is why he felt he might need an MIRL, HILLERY. ([id number]) operative debridement more aggressively than I can do in this clinic Review of systems; is otherwise negative he feels well 07/02/17; patient has been using Anasept gel with Sorbact. He changes this every day scrubs out the wound beds. Arrives today with the wound bed looking some better. Easier debridement 07/16/17; patient has been using Anasept gel was sorbact for about a month now. The necrotic service on his wound bed is better and the drainage is now down to minimal but we've not made any improvements in the epithelialization. Change to Santyl today. Surface of the wound is still not good enough to support skin substitutes 07/30/17 on evaluation today patient appears to be doing very well in regard to his right bimalleolus wounds. He has been tolerating the treatment with  the Anasept gel and sorbact. However we were going to try and switch to Santyl to be more aggressive with these wounds. This was however cost prohibitive. Therefore we will likely go back to the sorbact. Nonetheless he is not having any significant discomfort he still is forming slough over the wound location but nothing too significant. The wounds do appear to be filling in nicely. 08/13/17; I have not seen this wound in about a month. There is not much change. The fact the area medially looks larger. He has been using sorbact  and Anasept for over a month now without much effect. Arrives in clinic stating that he does not want the area debrided 08/28/17; patient arrives with the areas on his right medial and right lateral ankle worse. The wounds of expanded there is erythema and still drainage. There is no pain and no tenderness. We had been using Keracel AG clearly not doing well. 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 He states that even the evening that we began utilizing the Iodoflex he started having burning. Subsequently this never really improved and he eventually discontinue the use of the Iodoflex altogether. Unfortunately he did not let us know about any of this until today. I'm unsure of any specific infection at this point although I am going to obtain a wound culture today in order to see if there's anything that could potentially be causing an infection as well. It does sound as if he's had the Iodoflex before and did not have this kind of reaction although this does sound very specific for a reaction to the Iodoflex. 09/17/17 on evaluation today patient appears to be doing significantly better compared to last week's evaluation. At that time it actually appears that he did have an infection the good news is that we have been able to get this under control with switching to the [facility] solution he's not having as much pain and discomfort he still does have some erythema surrounding the medial aspect wound. He has been tolerating the Dakin's soaked gauze dressing which is excellent and that his wounds do not seem to have as much adherent slough. Overall I'm pleased with the progress she's made in last week. 11/05/17 on evaluation today patient  appears to be doing better in regard to his right medial and lateral malleolus ulcers. He has been tolerating the dressing changes without complication. I do flight the Freada Bergeron is helping with additional granulation at this point in the wound beds do appear to be a little bit better. With that being said patient does not appear to have any evidence of significant infection at this point there is no erythema as he has previously noted he does note that the 30 day course of antibiotics I placed him on will end tomorrow. 11/12/17 on evaluation today patient actually appears to be doing excellent in regard to his right medial and lateral malleolus ulcers. He has been tolerating the dressing changes without complication. Fortunately he has no evidence of infection at this time and overall I believe he is progressing extremely nicely he has a lot of new epithelialization noted on evaluation today. Patient likewise is very pleased with the progress even just since last week. 11/19/17 on  evaluation today patient appears to be doing about the same in regard to his ulcerations. He does not have any additional breakdown although he really has not noted any significant improvement either over the past week. With that being said the more concerning thing at this time is that he is beginning to show signs of erythema surrounding both wounds which is what typically happens and then he will develop into a more overt infection. This has been the course for some time. I hope that doing the 30 day in a biotic cycle this past time would break that so that he would not have the same type of thing happen again. Nonetheless it appears that is digressing back into this infected state unfortunately. With that being said he is not having any pain currently which is good he typically does start to hurt more as this progresses. Fortunately there does not appear to be any evidence of infection systemically which is good  news. 11/26/17 on evaluation today patient appears to actually be doing rather well in regard to his ulcer compared to last week. He still has your theme and noted around both the medial and lateral malleolus ulcers of the right lower extremity. He does not seem to be quite as overtly infected however. He has been tolerating the dressing changes without complication which is good news. The gentamicin cream does seem to have been of benefit for him. With that being said he does actually have an appointment with Dr. Sampson GoonFitzgerald this morning to see if there's anything that would be recommended in the way of IV antibiotic [name], [name] ([id number]) therapy. Otherwise he still has slough noted on the surface of the wound. Patient History Information obtained from Patient. Social History Former smoker - 10 years ago, Marital Status - Married, Alcohol Use - Moderate, Drug Use - No History, Caffeine Use - Never. Medical And Surgical History Notes Constitutional Symptoms (General Health) Vein Filter (groin area); CODA; H/O Blood Clots; pulmonary hypertensive arterial disease Review of Systems (ROS) Constitutional Symptoms (General Health) Denies complaints or symptoms of Fever, Chills. Respiratory The patient has no complaints or symptoms. Cardiovascular Complains or has symptoms of LE edema. Psychiatric The patient has no complaints or symptoms. Objective Constitutional Well-nourished and well-hydrated in no acute distress. Vitals Time Taken: 8:06 AM, Height: 76 in, Weight: 238 lbs, BMI: 29, Temperature: 98.1 F, Pulse: 68 bpm, Respiratory Rate: 18 breaths/min, Blood Pressure: 130/61 mmHg. Respiratory normal breathing without difficulty. Cardiovascular trace pitting edema of the bilateral lower extremities. Psychiatric this patient is able to make decisions and demonstrates good insight into disease process. Alert and Oriented x 3. pleasant and cooperative. General Notes: Patient's  wounds did require sharp debridement today. He tolerated this well today without complication and post debridement the wound beds both appear to be much cleaner and are doing better. With that being said I still do want him to see Dr. Sampson GoonFitzgerald due to the fact we have been back and forth with this in the way of infection and seem to be struggling to get this under control. Integumentary (Hair, Skin) Wound #1 status is Open. Original cause of wound was Gradually Appeared. The wound is located on the Right,Lateral [place], Chidi C. ([id number]) Malleolus. The wound measures 6.5cm length x 5.8cm width x 0.2cm depth; 29.61cm^2 area and 5.922cm^3 volume. There is Fat Layer (Subcutaneous Tissue) Exposed exposed. There is no tunneling or undermining noted. There is a large amount of serosanguineous drainage noted. The wound margin is distinct with  the outline attached to the wound base. There is small (1-33%) red granulation within the wound bed. There is a large (67-100%) amount of necrotic tissue within the wound bed including Adherent Slough. The periwound skin appearance did not exhibit: Callus, Crepitus, Excoriation, Induration, Rash, Scarring, Dry/Scaly, Maceration, Atrophie Blanche, Cyanosis, Ecchymosis, Hemosiderin Staining, Mottled, Pallor, Rubor, Erythema. Periwound temperature was noted as No Abnormality. The periwound has tenderness on palpation. Wound #2 status is Open. Original cause of wound was Gradually Appeared. The wound is located on the Right,Medial Malleolus. The wound measures 4cm length x 3.4cm width x 0.1cm depth; 10.681cm^2 area and 1.068cm^3 volume. There is Fat Layer (Subcutaneous Tissue) Exposed exposed. There is no tunneling or undermining noted. There is a medium amount of serous drainage noted. The wound margin is distinct with the outline attached to the wound base. There is medium (34- 66%) red granulation within the wound bed. There is a medium (34-66%) amount of  necrotic tissue within the wound bed including Adherent Slough. The periwound skin appearance did not exhibit: Callus, Crepitus, Excoriation, Induration, Rash, Scarring, Dry/Scaly, Maceration, Atrophie Blanche, Cyanosis, Ecchymosis, Hemosiderin Staining, Mottled, Pallor, Rubor, Erythema. Periwound temperature was noted as No Abnormality. The periwound has tenderness on palpation. 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 There was a Excisional Skin/Subcutaneous Tissue Debridement with a total area of 37.7 sq cm performed by STONE III, HOYT E., PA-C. With the following instrument(s): Curette. to remove Viable and Non-Viable tissue/material Material removed includes Subcutaneous Tissue, and Slough, Fibrin/Exudate, and [place] after achieving pain control using Lidocaine 4% Topical Solution. No specimens were taken. A time out was conducted at 08:21, prior to the start of the procedure. A Minimum amount of bleeding was controlled with Pressure. The procedure was tolerated well with a pain level of 0 throughout and a pain level of 0 following the procedure. Post Debridement Measurements: 6.5cm length x 5.8cm width x 0.3cm depth; 8.883cm^3 volume. Character of Wound/Ulcer Post Debridement requires further debridement. Severity of Tissue Post Debridement is: Fat layer exposed. Post procedure Diagnosis Wound #1: Same as Pre-Procedure Wound  #2 Pre-procedure diagnosis of Wound #2 is a Venous Leg Ulcer located on the Right,Medial Malleolus .Severity of Tissue Pre Debridement is: Fat layer exposed. There was a Excisional Skin/Subcutaneous Tissue Debridement with a total area of 13.6 sq cm performed by STONE III, HOYT E., PA-C. With the following instrument(s): Curette. to remove Viable and Non-Viable tissue/material Material removed includes Subcutaneous Tissue, and Slough, Fibrin/Exudate, and [place] after achieving pain control using Lidocaine 4% Topical Solution. No specimens were taken. A time out was conducted at 08:21, prior to the start of the procedure. A Minimum amount of bleeding was controlled with Pressure. The procedure was tolerated well with a pain [name], [name] level of 0 throughout and a pain level of 0 following the procedure. Post Debridement Measurements: 4cm length x 3.4cm width x 0.2cm depth; 2.136cm^3 volume. Character of Wound/Ulcer Post Debridement requires further debridement. Severity of Tissue Post Debridement is: Fat layer exposed. Post procedure Diagnosis Wound #2: Same as Pre-Procedure  Plan Wound Cleansing: Wound #1 Right,Lateral Malleolus: Clean wound with Normal Saline. Cleanse wound with mild soap and water May Shower, gently pat wound dry prior to applying new dressing. Wound #2 Right,Medial Malleolus: Clean wound with Normal Saline. Cleanse wound with mild soap and water May Shower, gently pat wound dry prior to applying new dressing. Anesthetic (add to Medication List): Wound #1 Right,Lateral Malleolus: Topical Lidocaine 4% cream applied to wound bed prior to debridement (In Clinic Only). Wound #2 Right,Medial Malleolus: Topical Lidocaine 4% cream applied to wound bed prior to debridement (In Clinic Only). Skin Barriers/Peri-Wound Care: Wound #1 Right,Lateral Malleolus: Barrier cream Wound #2 Right,Medial Malleolus: Barrier cream Primary Wound Dressing: Wound #1  Right,Lateral Malleolus: Gentamicin Sulfate Cream Silver Collagen Wound #2 Right,Medial Malleolus: Gentamicin Sulfate Cream Silver Collagen Secondary Dressing: Wound #1 Right,Lateral Malleolus: ABD pad Conform/Kerlix XtraSorb Wound #2 Right,Medial Malleolus: ABD pad Conform/Kerlix XtraSorb Dressing Change Frequency: Wound #1 Right,Lateral Malleolus: Change dressing every other day. Wound #2 Right,Medial Malleolus: Change dressing every other day. Follow-up Appointments: Wound #1 Right,Lateral Malleolus: Return Appointment in 1 week. Wound #2 Right,Medial Malleolus: Return Appointment in 1 week. Edema Control: [name], [name] ([id number]) Wound #1 Right,Lateral Malleolus: Elevate legs to the level of the heart and pump ankles as often as possible Wound #2 Right,Medial Malleolus: Elevate legs to the level of the heart and pump ankles as often as possible Additional Orders / Instructions: Wound #1 Right,Lateral Malleolus: Increase protein intake. Wound #2 Right,Medial Malleolus: Increase protein intake. Medications-please add to medication list.: Wound #1 Right,Lateral Malleolus: Other: - Vitamin C, Zinc, MVI Wound #2 Right,Medial Malleolus: Other: - Vitamin C, Zinc, MVI The following medication(s) was prescribed: lidocaine topical 4 % cream 1 1 cream topical was prescribed at facility I'm gonna recommend that we continue with the Current wound care measures for the time being we will see what Dr. Sampson Goon has to say this morning later concerning the patient and were things will go from there from an antibiotic standpoint. Currently I do not have him on the oral antibiotics. Otherwise will see patient for reevaluation in one weeks time. Please see above for specific wound care orders. We will see patient for re-evaluation in 1 week(s) here in the clinic. If anything worsens or changes patient will contact our office for additional recommendations. Electronic  Signature(s) Signed: 11/26/2017 5:42:34 PM By: Lenda Kelp PA-C Entered By: Lenda Kelp on 11/26/2017 16:57:48 [name] ([id number]) -------------------------------------------------------------------------------- ROS/PFSH Details Patient Name: [name] Date of Service: 11/26/2017 8:00 AM Medical Record Number: [id number] Patient Account Number: [id number] Date of Birth/Sex: 1946/05/08 (72 y.o. M) Treating RN: [name] Primary Care Provider: [name] Other Clinician: Referring Provider: [name] Treating Provider/Extender: STONE III, HOYT Weeks in Treatment: 15 Information Obtained From Patient Wound History Do you currently have one or more open woundso Yes How many open wounds do you currently haveo 1 Approximately how long have you had your woundso 5 months How have you been treating your wound(s) until nowo saline, dressing Has your wound(s) ever healed and then re-openedo Yes Have you had any lab work done in the past montho No Have you tested positive for an antibiotic resistant organism (MRSA, VRE)o No Have you tested positive for osteomyelitis (bone infection)o No Have you had any tests for circulation on your legso No Constitutional Symptoms (General Health) Complaints and Symptoms: Negative for: Fever; Chills Medical History: Past Medical History Notes: Vein Filter (groin area); CODA; H/O Blood Clots; pulmonary hypertensive arterial disease  Cardiovascular Complaints and Symptoms: Positive for: LE edema Medical History: Negative for: Angina; Arrhythmia; Congestive Heart Failure; Coronary Artery Disease; Deep Vein Thrombosis; Hypertension; Hypotension; Myocardial Infarction; Peripheral Arterial Disease; Peripheral Venous Disease; Phlebitis; Vasculitis Eyes Medical History: Positive for: Cataracts - removed Negative for: Glaucoma; Optic Neuritis Ear/Nose/Mouth/Throat Medical History: Negative for: Chronic sinus  problems/congestion Hematologic/Lymphatic Medical History: Negative for: Anemia; Hemophilia; Human Immunodeficiency Virus; Lymphedema; Sickle Cell Disease Respiratory Stitt, Danile C. ([id number]) Complaints and Symptoms: No Complaints or Symptoms Medical History: Positive for: Chronic Obstructive Pulmonary Disease (COPD) Negative for: Aspiration; Asthma; Pneumothorax; Sleep Apnea; Tuberculosis Gastrointestinal Medical History: Negative for: Cirrhosis ; Colitis; Crohnos; Hepatitis A; Hepatitis B; Hepatitis C Endocrine Medical History: Negative for: Type I Diabetes; Type II Diabetes Genitourinary Medical History: Negative for: End Stage Renal Disease Immunological Medical History: Negative for: Lupus Erythematosus; Raynaudos Integumentary (Skin) Medical History: Negative for: History of Burn; History of pressure wounds Musculoskeletal Medical History: Positive for: Osteoarthritis Negative for: Gout; Rheumatoid Arthritis; Osteomyelitis Neurologic Medical History: Negative for: Dementia; Neuropathy; Quadriplegia; Paraplegia; Seizure Disorder Oncologic Medical History: Negative for: Received Chemotherapy; Received Radiation Psychiatric Complaints and Symptoms: No Complaints or Symptoms Medical History: Negative for: Anorexia/bulimia; Confinement Anxiety HBO Extended History Items Eyes: Cataracts Prell, Zebadiah C. ([id number]) Immunizations Pneumococcal Vaccine: Received Pneumococcal Vaccination: No Implantable Devices Family and Social History Former smoker - 10 years ago; Marital Status - Married; Alcohol Use: Moderate; Drug Use: No History; Caffeine Use: Never; Advanced Directives: No; Patient does not want information on Advanced Directives; Living Will: No; Medical Power of Attorney: No Physician Affirmation I have reviewed and agree with the above information. Electronic Signature(s) Signed: 11/26/2017 5:42:34 PM By: Lenda Kelp PA-C Signed: 11/28/2017  4:32:30 PM By: Alejandro Mulling Entered By: Lenda Kelp on 11/26/2017 16:56:50 [name] ([id number]) -------------------------------------------------------------------------------- SuperBill Details Patient Name: [name] Date of Service: 11/26/2017 Medical Record Number: [id number] Patient Account Number: [id number] Date of Birth/Sex: Dec 13, 1945 (73 y.o. 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, initial encounter T81.31XD Disruption of external operation (surgical) wound, not elsewhere classified, subsequent encounter Facility Procedures CPT4 Code: [id number] Description: 11042 - DEB SUBQ TISSUE 20 SQ CM/< ICD-10 Diagnosis Description L97.312 Non-pressure chronic ulcer of right ankle with fat layer expo Modifier: sed Quantity: 1 CPT4 Code: [id number] Description: 11045 - DEB SUBQ TISS EA ADDL 20CM ICD-10 Diagnosis Description L97.312 Non-pressure chronic ulcer of right ankle with fat layer expo Modifier: sed Quantity: 2 Physician Procedures CPT4 Code: [id number] Description: 11042 - WC PHYS SUBQ TISS 20 SQ CM ICD-10 Diagnosis Description L97.312 Non-pressure chronic ulcer of right ankle with fat layer expos Modifier: ed Quantity: 1 CPT4 Code: [id number] Description: 11045 - WC PHYS SUBQ TISS EA ADDL 20 CM ICD-10 Diagnosis Description L97.312 Non-pressure chronic ulcer of right ankle with fat layer expos Modifier: ed Quantity: 2 Electronic Signature(s) Signed: 11/26/2017 5:42:34 PM By: Lenda Kelp PA-C Entered By:  Lenda Kelp on 11/26/2017 16:58:14

## 2017-12-03 ENCOUNTER — Encounter: Payer: Medicare HMO | Admitting: Physician Assistant

## 2017-12-03 DIAGNOSIS — I87331 Chronic venous hypertension (idiopathic) with ulcer and inflammation of right lower extremity: Secondary | ICD-10-CM | POA: Diagnosis not present

## 2017-12-05 NOTE — Progress Notes (Signed)
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 of normal epithelialization. 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 He denies any complaints of pain or intolerance to compression. Both ulcers are smaller in size oriented today's measurements, both are covered with a softly adherent slough. 07/24/16; medial wound is smaller, lateral about the same although surface looks better. Still using Hydrofera Blue 07/31/16; arrives today complaining of pain in the lateral  part of his foot. Nurse reports a lot more drainage. He has been using Hydrofera Blue. Switch to silver alginate today 08/03/2016 -- I was asked to see the patient was here for a nurse visit today. I understand he had a lot of pain in his right lower extremity and was having blisters on his right foot which have not been there before. Though he started on doxycycline he does not have blisters elsewhere on his body. I do not believe this is a drug allergy. also mentioned that there was a copious purulent discharge from the wound and clinically there is no evidence of cellulitis. 08/07/16; I note that the patient came in for his nurse check on Friday apparently with blisters on his toes on the right than a lot of swelling in his forefoot. He continued on the doxycycline that I had prescribed on 12/8. A culture was done of the lateral wound that showed a combination of a few Proteus and Pseudomonas. Doxycycline might of covered the Proteus but would be unlikely to cover the Pseudomonas. He is on Coumadin. He arrives in the clinic today feeling a lot better states the pain is a lot better but nothing specific really was done other than to rewrap the foot also noted that he had arterial studies ordered in August although these were never done. It is reasonable to go ahead and reorder these. 08/14/16; generally arrives  in a better state today in terms of the wounds he has taken cefdinir for one week. Our intake nurse reports copious amounts of drainage but the patient is complaining of much less pain. He is not had his PT and INR checked and I've asked him to do this today or tomorrow. 08/24/2016 -- patient arrives today after 10 days and said he had a stomach upset. His arterial study was done and I have reviewed this report and find it to be within normal limits. However I did not note any venous duplex studies for reflux, and Dr. Leanord Hawking may have ordered these in the past but I will leave it to him to decide if he needs these. The patient has finished his course of cefdinir. 08/28/16; patient arrives today again with copious amounts of thick really green drainage for our intake nurse. He states he has a very tender spot at the superior part of the lateral wound. Wounds are larger 09/04/16; no real change in the condition of this patient's wound still copious amounts of surface slough. Started him on Iodoflex last week he is completing another course of Cefdinir or which I think was done empirically. His arterial study showed ABIs were 1.1 on the right 1.5 on the left. He did have a slightly reduced ABI in the right the left one was not obtained. Had calcification of the right posterior tibial artery. The interpretation was no segmental stenosis. His waveforms were triphasic. CARDEN, TEEL ([id number]) His reflux studies are later this month. Depending on this I'll send him for a vascular consultation, he may need to see plastic surgery as I believe he is had plastic surgery on this foot in the past. He had an injury to the foot in the 1980s. 1/16 /18 right lateral greater than right medial ankle wounds on the right in the setting of previous skin grafting. 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 He does not have arterial  issues. Each week he comes  in with the same adherent surface slough although there was less of this today 09/18/16; right lateral greater than right medial lower extremity wounds in the setting of previous skin grafting and trauma. He has least to vein laser ablation scheduled for February 2 for venous reflux. He does not have significant arterial disease. Problem has been very difficult to handle surface slough/necrotic tissue. Recently using Iodoflex for this with some, albeit slow improvement 09/25/16; right lateral greater than right medial lower extremity venous wounds in the setting of previous skin grafting. He is going for ablation surgery on February 2 after this he'll come back here for rewrap. He has been using Iodoflex as the primary dressing. 10/02/16; right lateral greater than right medial lower extremity wounds in the setting of previous skin grafting. He had his ablation surgery last week, I don't have a report. He tolerated this well. Came in with a thigh-high Unna boots on Friday. We have been using Iodoflex as the primary dressing. His measurements are improving 10/09/16; continues to make nice aggressive in terms of the wounds on his lateral and medial right ankle in the setting of previous skin grafting. Yesterday he noticed drainage at one of his surgical sites from his venous ablation on the right calf. He took off the bandage over this area felt a "popping" sensation and a reddish-brown drainage. He is not complaining of any pain 10/16/16; he continues to make nice progression in terms of the wounds on the lateral and medial malleolus. Both smaller using Iodoflex. He had a surgical area in his posterior mid calf we have been using iodoform. All the wounds are down and dimensions 10/23/16; the patient arrives today with no complaints. He states the Iodoflex is a bit uncomfortable. He is not systemically unwell. We have been using Iodoflex to the lateral right ankle and the medial  and Aquacel Ag to the reflux surgical wound on the posterior right calf. All of these wounds are doing well 10/30/16; patient states he has no pain no systemic symptoms. I changed him to [facility] last week. Although the wounds are doing well 11/06/16; patient reports no pain or systemic symptoms. We continue with Hydrofera Blue. Both wound areas on the medial and lateral ankle appear to be doing well with improvement and dimensions and improvement in the wound bed. 11/13/16; patient's dimensions continued to improve. We continue with Hydrofera Blue on the medial and lateral side. Appear to be doing well with healthy granulation and advancing epithelialization 11/20/16; patient's dimensions improving laterally by about half a centimeter in length. Otherwise no change on the medial side. Using [facility] 12/04/16; no major change in patient's wound dimensions. Intake nurse reports more drainage. 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 He is voicing no complaints or concerns. He has been tolerating his twice weekly compression therapy changes 12/11/16 Patient complains of pain and increased drainage.. wants hydrofera blue 12/18/16 improvement. Sorbact 12/25/16; medial wound is smaller, lateral measures the same. Still on sorbact 01/01/17; medial wound continues to be smaller, lateral measures about the same however there is clearly advancing epithelialization here as well in fact I think the wound will ultimately divided into 2 open areas 01/08/17; unfortunately today fairly significant regression in several areas. Surface of the lateral wound covered again in adherent necrotic material which is difficult to debridement. He has significant surrounding skin maceration. The expanding area of tissue epithelialization in  the middle of the wound that was encouraging last week appears to be  smaller. There is no surrounding tenderness. The area on the medial leg also did not seem to be as healthy as last week, the reason for this regression this week is not totally clear. We have been using Sorbact for the last 4 weeks. We'll switched of polymen AG which we will order via home medical supply. If there is a problem with this would switch back to Iodoflex 01/15/18; drainage,odor. No change. Switched to polymen last week 01/22/17; still continuous drainage. Culture I did last week showed a few Proteus pansensitive. I did this culture because of drainage. Put him on Augmentin which she has been taking since Saturday however he is developed 4-5 liquid bowel movements. He is also on Coumadin. Beyond this wound is not changed at all, still nonviable necrotic surface material which I debrided reveals healthy granulation line 01/29/17; still copious amounts of drainage reported by her intake nurse. Wound measuring slightly smaller. Currently fact on Iodoflex although I'm looking forward to changing back to perhaps [facility] or polymen AG 02/05/17; still large amounts of drainage and presenting with really large amounts of adherent slough and necrotic material over the remaining open area of the wound. We have been using Iodoflex but with little improvement in the surface. Change to [facility] 02/12/17; still large amount of drainage. Much less adherent slough however. Started [employer] [facility]	[place] last week TANNAR, BROKER ([id number]) 02/19/17; drainage is better this week. Much less adherent slough. Perhaps some improvement in dimensions. Using [facility] 02/26/17; severe venous insufficiency wounds on the right lateral and right medial leg. Drainage is some better and slough is less adherent we've been using Hydrofera Blue 03/05/17 on evaluation today patient appears to be doing well. His wounds have been decreasing in size and overall he is pleased with how this is progressing. We are  awaiting approval for the epigraph which has previously been recommended in the meantime the [facility] Dressing is to be doing very well for him. 03/12/17; wound dimensions are smaller still using Hydrofera Blue. Comes in on Fridays for a dressing change. 03/19/17; wound dimensions continued to contract. Healing of this wound is complicated by continuous significant drainage as well as recurrent buildup of necrotic surface material. We looked into Apligraf and he has a $290 co-pay per application but truthfully I think the drainage as well as the nonviable surface would preclude use of Apligraf there are any other skin substitute at this point therefore the continued plan will be debridement each clinic visit, 2 times a week dressing changes and continued use of Hydrofera Blue. improvement has been very slow but sustained 03/26/17 perhaps slight improvements in peripheral epithelialization is especially inferiorly. Still with large amount of drainage and tightly adherent necrotic surface on arrival. Along with the intake nurse I reviewed previous treatment. He worsened on Iodoflex and had a 4 week trial of sorbact, Polymen AG and a long courses of Aquacel Ag. He is not a candidate for advanced treatment options for many reasons 04/09/17 on evaluation today patient's right lower extremity wounds appear to be doing a little better. Fortunately he has no significant discomfort and has been tolerating the dressing changes including the wraps without complication. With that being said he really does not have swelling anymore compared to what he has had in the past following his vascular intervention. He wonders if potentially we could attempt avoiding the rats to see if he could cleanse  the wound in between to try to prevent some of the fiber and its buildup that occurs in the interim between when we see him week to week. No fevers, chills, nausea, or vomiting noted at this time. 04/16/17; noted that  the staff made a choice last week not to put him in compression. The patient is changing his dressing at home using [facility] and changing every second day. His wife is washing the wound with saline. He is using kerlix. Surprisingly he has not developed a lot of edema. This choice was made because of the degree of fluid retention and maceration even with changing his dressing twice a week 04/23/17; absolutely no change. Using Hydrofera Blue. Recurrent tightly adherent nonviable surface material. This is been refractory to Iodoflex, sorbact and now Hydrofera Blue. More recently he has been changing his own dressings at home, cleansing the wound in the shower. He has not developed lower extremity edema 04/30/17; if anything the wound is larger still in adherent surface although it debrided easily today. I've been using Mehta honey for 1 week and I'd like to try and do it for a second week this see if we can get some form of viable surface here. 05/07/17; patient arrives with copious amounts of drainage and some pain in the superior part of the wound. He has not been systemically unwell. He's been changing this daily at home 05/14/17; patient arrives today complaining of less drainage and less pain. Dimensions slightly better. Surface culture I did of this last week grew MSSA I put him on dicloxacillin for 10 days. Patient requesting a further prescription since he feels so much better. He did not obtain the [facility] AG for reasons that aren't clear, he has been using Hydrofera Blue 05/21/17; perhaps some less drainage and less pain. He is completing another week of doxycycline. Unfortunately there is no change in the wound measurements are appearance still tightly adherent necrotic surface material that is really defied treatment. We have been using Hydrofera Blue most recently. He did not manage to get Anasept. He was told it was prescription at [facility] 05/29/17; absolutely no improvement in these  wound areas. He had remote plastic surgery with who was done at [facility] in [place] surrounding this area. This was a traumatic wound with extensive plastic surgery/skin grafting at that time. I switched him to sorbact last week to see if he can do anything about the recurrent necrotic surface and drainage. This is largely been refractory to Iodoflex/Hydrofera Blue/alginates. I believe we tried Elby Showers and more recently Medi honey l however the drainage is really too excessive 06/04/17; patient went to see Dr. Ulice Bold. Per the patient she ordered him a compression stocking. Continued the Anasept gel and sorbact was already on. No major improvement in the condition of the wounds in fact the medial wound looks larger. Again per the patient she did not feel a skin graft or operative debridement was indicated The patient had previous venous ablation in February 2018. Last arterial studies were in December 2017 and were within normal limits 06/18/17; patient arrives back in clinic today using Anasept gel with sorbact. He changes this every day is wearing his own compression stocking. The patient actually saw Dr. Leta Baptist of plastic surgery. I've reviewed her note. The appointment was on 05/30/17. The basic issue with here was that she did not feel that skin grafts for patients with underlying stasis and edema have a good long-term success rate. She also discuss skin substitutes which has  been done here as well however I have not been able to get the surface of these wounds to something that I think would support skin substitutes. This is why he felt he might need an JOEANGEL, JEANPAUL. ([id number]) operative debridement more aggressively than I can do in this clinic Review of systems; is otherwise negative he feels well 07/02/17; patient has been using Anasept gel with Sorbact. He changes this every day scrubs out the wound beds. Arrives today with the wound bed looking some better. Easier  debridement 07/16/17; patient has been using Anasept gel was sorbact for about a month now. The necrotic service on his wound bed is better and the drainage is now down to minimal but we've not made any improvements in the epithelialization. Change to Santyl today. Surface of the wound is still not good enough to support skin substitutes 07/30/17 on evaluation today patient appears to be doing very well in regard to his right bimalleolus wounds. He has been tolerating the treatment with the Anasept gel and sorbact. However we were going to try and switch to Santyl to be more aggressive with these wounds. This was however cost prohibitive. Therefore we will likely go back to the sorbact. Nonetheless he is not having any significant discomfort he still is forming slough over the wound location but nothing too significant. The wounds do appear to be filling in nicely. 08/13/17; I have not seen this wound in about a month. There is not much change. The fact the area medially looks larger. He has been using sorbact and Anasept for over a month now without much effect. Arrives in clinic stating that he does not want the area debrided 08/28/17; patient arrives with the areas on his right medial and right lateral ankle worse. The wounds of expanded there is erythema and still drainage. There is no pain and no tenderness. We had been using Keracel AG clearly not doing well. 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 He states that even the evening that we began utilizing the Iodoflex he started having burning. Subsequently this never really improved and he eventually discontinue the use of the Iodoflex altogether. Unfortunately he did not let us  know about any of this until today. I'm unsure of any specific infection at this point although I am going to obtain a wound culture today in order to see if there's anything that could potentially be causing an infection as well. It does sound as if he's had the Iodoflex before and did not have this kind of reaction although this does sound very specific for a reaction to the Iodoflex. 09/17/17 on evaluation today patient appears to be doing significantly better compared to last week's evaluation. At that time it actually appears that he did have an infection the good news is that we have been able to get this under control with switching to the [facility] solution he's not having as much pain and discomfort he still does have some erythema surrounding the medial aspect wound. He has been tolerating the Dakin's soaked gauze dressing which is excellent and that his wounds do not seem to have as much adherent slough. Overall I'm pleased with the progress she's made in last week. 11/05/17  on evaluation today patient appears to be doing better in regard to his right medial and lateral malleolus ulcers. He has been tolerating the dressing changes without complication. I do flight the Freada Bergeron is helping with additional granulation at this point in the wound beds do appear to be a little bit better. With that being said patient does not appear to have any evidence of significant infection at this point there is no erythema as he has previously noted he does note that the 30 day course of antibiotics I placed him on will end tomorrow. 11/12/17 on evaluation today patient actually appears to be doing excellent in regard to his right medial and lateral malleolus ulcers. He has been tolerating the dressing changes without complication. Fortunately he has no evidence of infection at this time and overall I believe he is progressing extremely nicely he has a lot of new epithelialization noted on evaluation  today. Patient likewise is very pleased with the progress even just since last week. 11/19/17 on evaluation today patient appears to be doing about the same in regard to his ulcerations. He does not have any additional breakdown although he really has not noted any significant improvement either over the past week. With that being said the more concerning thing at this time is that he is beginning to show signs of erythema surrounding both wounds which is what typically happens and then he will develop into a more overt infection. This has been the course for some time. I hope that doing the 30 day in a biotic cycle this past time would break that so that he would not have the same type of thing happen again. Nonetheless it appears that is digressing back into this infected state unfortunately. With that being said he is not having any pain currently which is good he typically does start to hurt more as this progresses. Fortunately there does not appear to be any evidence of infection systemically which is good news. 11/26/17 on evaluation today patient appears to actually be doing rather well in regard to his ulcer compared to last week. He still has your theme and noted around both the medial and lateral malleolus ulcers of the right lower extremity. He does not seem to be quite as overtly infected however. He has been tolerating the dressing changes without complication which is good news. The gentamicin cream does seem to have been of benefit for him. With that being said he does actually have an appointment with Dr. Sampson Goon this morning to see if there's anything that would be recommended in the way of IV antibiotic Stripling, Jerritt C. ([id number]) therapy. Otherwise he still has slough noted on the surface of the wound. 12/03/17 on evaluation today patient presents for follow-up concerning his ongoing right lower extremity ulcers. He has been tolerating the dressing changes without complication  he states he has not really been using gentamicin on the lateral ankle and this seems to be doing very well. For that reason I think that is probably fine to put a hold on the gentamicin he states his burns and stings when he applies it and we maybe be able to just use this intermittently when things become more irritated. Other than that the good news is I did review his MRI which was performed on 11/28/17 and revealed that he has a negative MRI for abscess, osteomyelitis, or septic joint. Obviously these were the issues that I was concerned with the possibility of and I'm pleased to find that there  is no problems in that regard. I did also review Dr. Jarrett Ables note from infectious disease he discussed performed some lab work which apparently was done and then subsequently was going to consider the possibility of Keflex long-term for prevention of infection although this has not been prescribed as of yet. Patient History Information obtained from Patient. Social History Former smoker - 10 years ago, Marital Status - Married, Alcohol Use - Moderate, Drug Use - No History, Caffeine Use - Never. Medical And Surgical History Notes Constitutional Symptoms (General Health) Vein Filter (groin area); CODA; H/O Blood Clots; pulmonary hypertensive arterial disease Review of Systems (ROS) Constitutional Symptoms (General Health) Denies complaints or symptoms of Fever, Chills. Respiratory The patient has no complaints or symptoms. Cardiovascular The patient has no complaints or symptoms. Psychiatric The patient has no complaints or symptoms. Objective Constitutional Well-nourished and well-hydrated in no acute distress. Vitals Time Taken: 8:10 AM, Height: 76 in, Weight: 238 lbs, BMI: 29, Temperature: 98.2 F, Pulse: 65 bpm, Respiratory Rate: 18 breaths/min, Blood Pressure: 134/63 mmHg. Respiratory normal breathing without difficulty. Cardiovascular trace pitting edema of the bilateral lower  extremities. Psychiatric this patient is able to make decisions and demonstrates good insight into disease process. Alert and Oriented x 3. pleasant Jahnke, Selso C. ([id number]) and cooperative. General Notes: Patient's wounds did show slough covering the surface of the wound which was sharply debrided at this point without complication. Post debridement patient's wounds actually appear to be doing much better and he has a fairly good granulation bed. On the lateral ankle aspect the granular bed seems to be improving in quality in my opinion. Integumentary (Hair, Skin) Wound #1 status is Open. Original cause of wound was Gradually Appeared. The wound is located on the Right,Lateral Malleolus. The wound measures 6cm length x 5.8cm width x 0.2cm depth; 27.332cm^2 area and 5.466cm^3 volume. There is Fat Layer (Subcutaneous Tissue) Exposed exposed. There is no tunneling or undermining noted. There is a large amount of serosanguineous drainage noted. The wound margin is distinct with the outline attached to the wound base. There is small (1-33%) red granulation within the wound bed. There is a large (67-100%) amount of necrotic tissue within the wound bed including Adherent Slough. The periwound skin appearance did not exhibit: Callus, Crepitus, Excoriation, Induration, Rash, Scarring, Dry/Scaly, Maceration, Atrophie Blanche, Cyanosis, Ecchymosis, Hemosiderin Staining, Mottled, Pallor, Rubor, Erythema. Periwound temperature was noted as No Abnormality. The periwound has tenderness on palpation. Wound #2 status is Open. Original cause of wound was Gradually Appeared. The wound is located on the Right,Medial Malleolus. The wound measures 4cm length x 3.5cm width x 0.1cm depth; 10.996cm^2 area and 1.1cm^3 volume. There is Fat Layer (Subcutaneous Tissue) Exposed exposed. There is no tunneling or undermining noted. There is a medium amount of serous drainage noted. The wound margin is distinct with the  outline attached to the wound base. There is medium (34- 66%) red granulation within the wound bed. There is a medium (34-66%) amount of necrotic tissue within the wound bed including Adherent Slough. The periwound skin appearance did not exhibit: Callus, Crepitus, Excoriation, Induration, Rash, Scarring, Dry/Scaly, Maceration, Atrophie Blanche, Cyanosis, Ecchymosis, Hemosiderin Staining, Mottled, Pallor, Rubor, Erythema. Periwound temperature was noted as No Abnormality. The periwound has tenderness on palpation. 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 There was a Excisional Skin/Subcutaneous Tissue Debridement with a total area of 34.8 sq cm performed by STONE III, HOYT E., PA-C. With the following instrument(s): Curette. to remove Viable and Non-Viable tissue/material Material removed includes Subcutaneous Tissue, and Slough, Fibrin/Exudate, and [place] after achieving pain control using Lidocaine 4% Topical Solution. No specimens were taken. A time out was conducted at 08:21, prior to the start of the procedure. A Minimum amount of bleeding was controlled with Pressure. The procedure was tolerated well with a pain level of 0 throughout and a pain level of 0 following the procedure. Post Debridement Measurements: 6cm length x 5.8cm width x 0.3cm depth; 8.2cm^3 volume. Character of Wound/Ulcer Post Debridement requires further  debridement. Severity of Tissue Post Debridement is: Fat layer exposed. Post procedure Diagnosis Wound #1: Same as Pre-Procedure Thies, Gareth C. ([id number]) Wound #2 Pre-procedure diagnosis of Wound #2 is a Venous Leg Ulcer located on the Right,Medial Malleolus .Severity of Tissue Pre Debridement is: Fat layer exposed. There was a Excisional Skin/Subcutaneous Tissue Debridement with a total area of 14 sq cm performed by STONE III, HOYT E., PA-C. With the following instrument(s): Curette. to remove Viable and Non-Viable tissue/material Material removed includes Subcutaneous Tissue, and Slough, Fibrin/Exudate, and [place] after achieving pain control using Lidocaine 4% Topical Solution. No specimens were taken. A time out was conducted at 08:21, prior to the start of the procedure. A Minimum amount of bleeding was controlled with Pressure. The procedure was tolerated well with a pain level of 0 throughout and a pain level of 0 following the procedure. Post Debridement Measurements: 4cm length x 3.5cm width x 0.2cm depth; 2.199cm^3 volume. Character of Wound/Ulcer Post Debridement requires further debridement. Severity of Tissue Post Debridement is: Fat layer exposed. Post procedure Diagnosis Wound #2: Same as Pre-Procedure Plan Wound Cleansing: Wound #1 Right,Lateral Malleolus: Clean wound with Normal Saline. Cleanse wound with mild soap and water May Shower, gently pat wound dry prior to applying new dressing. Wound #2 Right,Medial Malleolus: Clean wound with Normal Saline. Cleanse wound with mild soap and water May Shower, gently pat wound dry prior to applying new dressing. Anesthetic (add to Medication List): Wound #1 Right,Lateral Malleolus: Topical Lidocaine 4% cream applied to wound bed prior to debridement (In Clinic Only). Wound #2 Right,Medial Malleolus: Topical Lidocaine 4% cream applied to wound bed prior to debridement (In Clinic Only). Skin Barriers/Peri-Wound  Care: Wound #1 Right,Lateral Malleolus: Barrier cream Wound #2 Right,Medial Malleolus: Barrier cream Primary Wound Dressing: Wound #1 Right,Lateral Malleolus: Hydrogel Silver Collagen Wound #2 Right,Medial Malleolus: Hydrogel Silver Collagen Secondary Dressing: Wound #1 Right,Lateral Malleolus: ABD pad Dry Gauze Conform/Kerlix XtraSorb Wound #2 Right,Medial Malleolus: ABD pad Dry Gauze Conform/Kerlix XtraSorb Vanderpool, Kyri C. ([id number]) Dressing Change Frequency: Wound #1 Right,Lateral Malleolus: Change dressing every other day. Wound #2 Right,Medial Malleolus: Change dressing every other day. Follow-up Appointments: Wound #1 Right,Lateral Malleolus: Return Appointment in 1 week. Wound #2 Right,Medial Malleolus: Return Appointment in 1 week. Edema Control: Wound #1 Right,Lateral Malleolus: Elevate legs to the level of the heart and pump ankles as often as possible Wound #2 Right,Medial Malleolus: Elevate legs to the level of the heart and pump ankles as often as possible Additional Orders / Instructions: Wound #1 Right,Lateral Malleolus: Increase protein intake. Wound #2 Right,Medial Malleolus:  Increase protein intake. Medications-please add to medication list.: Wound #1 Right,Lateral Malleolus: Other: - Vitamin C, Zinc, MVI Wound #2 Right,Medial Malleolus: Other: - Vitamin C, Zinc, MVI The following medication(s) was prescribed: lidocaine topical 4 % cream 1 1 cream topical was prescribed at facility I'm gonna suggest at this point in time that we continue with the Current wound care measures. Patient seems to be doing well. We will put a hold on the gentamicin for the time being he will use this just if things start to look infected to see if he can help manage. Otherwise hopefully the silver collagen will be sufficient to manage the bacterial by burden. Will otherwise see were things stand in one weeks time we see him for reevaluation. Please see above  for specific wound care orders. We will see patient for re-evaluation in 1 week(s) here in the clinic. If anything worsens or changes patient will contact our office for additional recommendations. Electronic Signature(s) Signed: 12/03/2017 8:41:23 PM By: Lenda Kelp PA-C Entered By: Lenda Kelp on 12/03/2017 08:39:05 [name] ([id number]) -------------------------------------------------------------------------------- ROS/PFSH Details Patient Name: [name] Date of Service: 12/03/2017 8:00 AM Medical Record Number: [id number] Patient Account Number: [id number] Date of Birth/Sex: [birth year] (72 y.o. M) Treating RN: [name] Primary Care Provider: [name] Other Clinician: Referring Provider: [name] Treating Provider/Extender: STONE III, HOYT Weeks in Treatment: 55 Information Obtained From Patient Wound History Do you currently have one or more open woundso Yes How many open wounds do you currently haveo 1 Approximately how long have you had your woundso 5 months How have you been treating your wound(s) until nowo saline, dressing Has your wound(s) ever healed and then re-openedo Yes Have you had any lab work done in the past montho No Have you tested positive for an antibiotic resistant organism (MRSA, VRE)o No Have you tested positive for osteomyelitis (bone infection)o No Have you had any tests for circulation on your legso No Constitutional Symptoms (General Health) Complaints and Symptoms: Negative for: Fever; Chills Medical History: Past Medical History Notes: Vein Filter (groin area); CODA; H/O Blood Clots; pulmonary hypertensive arterial disease Eyes Medical History: Positive for: Cataracts - removed Negative for: Glaucoma; Optic Neuritis Ear/Nose/Mouth/Throat Medical History: Negative for: Chronic sinus problems/congestion Hematologic/Lymphatic Medical History: Negative for: Anemia; Hemophilia; Human Immunodeficiency  Virus; Lymphedema; Sickle Cell Disease Respiratory Complaints and Symptoms: No Complaints or Symptoms Medical History: Positive for: Chronic Obstructive Pulmonary Disease (COPD) Negative for: Aspiration; Asthma; Pneumothorax; Sleep Apnea; Tuberculosis Cardiovascular JESSEN, SIEGMAN. ([id number]) Complaints and Symptoms: No Complaints or Symptoms Medical History: Negative for: Angina; Arrhythmia; Congestive Heart Failure; Coronary Artery Disease; Deep Vein Thrombosis; Hypertension; Hypotension; Myocardial Infarction; Peripheral Arterial Disease; Peripheral Venous Disease; Phlebitis; Vasculitis Gastrointestinal Medical History: Negative for: Cirrhosis ; Colitis; Crohnos; Hepatitis A; Hepatitis B; Hepatitis C Endocrine Medical History: Negative for: Type I Diabetes; Type II Diabetes Genitourinary Medical History: Negative for: End Stage Renal Disease Immunological Medical History: Negative for: Lupus Erythematosus; Raynaudos Integumentary (Skin) Medical History: Negative for: History of Burn; History of pressure wounds Musculoskeletal Medical History: Positive for: Osteoarthritis Negative for: Gout; Rheumatoid Arthritis; Osteomyelitis Neurologic Medical History: Negative for: Dementia; Neuropathy; Quadriplegia; Paraplegia; Seizure Disorder Oncologic Medical History: Negative for: Received Chemotherapy; Received Radiation Psychiatric Complaints and Symptoms: No Complaints or Symptoms Medical History: Negative for: Anorexia/bulimia; Confinement Anxiety HBO Extended History Items Wachob, Theoren C. ([id number]) Eyes: Cataracts Immunizations Pneumococcal Vaccine: Received Pneumococcal Vaccination: No Implantable Devices Family and Social History Former smoker - 10 years ago; Marital  Status - Married; Alcohol Use: Moderate; Drug Use: No History; Caffeine Use: Never; Advanced Directives: No; Patient does not want information on Advanced Directives; Living Will: No;  Medical Power of Attorney: No Physician Affirmation I have reviewed and agree with the above information. Electronic Signature(s) Signed: 12/03/2017 5:03:17 PM By: Alejandro Mulling Signed: 12/03/2017 8:41:23 PM By: Lenda Kelp PA-C Entered By: Lenda Kelp on 12/03/2017 08:36:59 [name] ([id number]) -------------------------------------------------------------------------------- SuperBill Details Patient Name: [name] Date of Service: 12/03/2017 Medical Record Number: [id number] Patient Account Number: [id number] Date of Birth/Sex: 09/07/1945 (72 y.o. 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, initial encounter T81.31XD Disruption of external operation (surgical) wound, not elsewhere classified, subsequent encounter Facility Procedures CPT4 Code: [id number] Description: 11042 - DEB SUBQ TISSUE 20 SQ CM/< ICD-10 Diagnosis Description L97.312 Non-pressure chronic ulcer of right ankle with fat layer expo Modifier: sed Quantity: 1 CPT4 Code: [id number] Description: 11045 - DEB SUBQ TISS EA ADDL 20CM ICD-10 Diagnosis Description L97.312 Non-pressure chronic ulcer of right ankle with fat layer expo Modifier: sed Quantity: 2 Physician Procedures CPT4 Code: 2355732 Description: 11042 - WC PHYS SUBQ TISS 20 SQ CM ICD-10 Diagnosis Description L97.312 Non-pressure chronic ulcer of right ankle with fat layer expos Modifier: ed Quantity: 1 CPT4 Code: [id number] Description: 11045 - WC PHYS SUBQ TISS EA ADDL 20 CM ICD-10  Diagnosis Description L97.312 Non-pressure chronic ulcer of right ankle with fat layer expos Modifier: ed Quantity: 2 Electronic Signature(s) Signed: 12/03/2017 8:41:23 PM By: Lenda Kelp PA-C Entered By: Lenda Kelp on 12/03/2017 08:39:26

## 2017-12-05 NOTE — Progress Notes (Signed)
[name], [name] ([id number]) Visit Report for 12/03/2017 Arrival Information Details Patient Name: [name], [name] Date of Service: 12/03/2017 8:00 AM Medical Record Number: [id number] Patient Account Number: [id number] Date of Birth/Sex: 20-Sep-1945 (72 y.o. M) Treating RN: Renne Crigler Primary Care Wrenly Lauritsen: [name] Other Clinician: Referring Evie Crumpler: [name] Treating Glendell Schlottman/Extender: Linwood Dibbles, HOYT Weeks in Treatment: 98 Visit Information History Since Last Visit All ordered tests and consults were completed: No Patient Arrived: Ambulatory Added or deleted any medications: No Arrival Time: 07:59 Any new allergies or adverse reactions: No Accompanied By: self Had a fall or experienced change in No Transfer Assistance: None activities of daily living that may affect Patient Identification Verified: Yes risk of falls: Secondary Verification Process Yes Signs or symptoms of abuse/neglect since last visito No Completed: Hospitalized since last visit: No Patient Requires Transmission-Based No Implantable device outside of the clinic excluding No Precautions: cellular tissue based products placed in the center Patient Has Alerts: Yes since last visit: Patient Alerts: Patient on Blood Pain Present Now: No Thinner Electronic Signature(s) Signed: 12/03/2017 4:09:12 PM By: Renne Crigler Entered By: Renne Crigler on 12/03/2017 08:00:20 [name] ([id number]) -------------------------------------------------------------------------------- Encounter Discharge Information Details Patient Name: [name] Date of Service: 12/03/2017 8:00 AM Medical Record Number: [id number] Patient Account Number: [id number] Date of Birth/Sex: Oct 11, 1945 (72 y.o. M) Treating RN: Renne Crigler Primary Care Shanya Ferriss: [name] Other Clinician: Referring Esdras Delair: [name] Treating Jermya Dowding/Extender: Linwood Dibbles, HOYT Weeks in Treatment: 19 Encounter  Discharge Information Items Facility Notification Discharge Pain Level: 0 Telephoned: Yes Discharge Condition: Stable Orders Sent: Yes Ambulatory Status: Ambulatory Additional Notification Discharge Destination: Home Telephoned: Yes Transportation: Private Auto Orders Sent: Yes Schedule Follow-up Appointment: Yes Medication Reconciliation completed and No provided to Patient/Care Lacy Sofia: Patient Clinical Summary of Care: Declined Electronic Signature(s) Signed: 12/03/2017 4:31:13 PM By: Gwenlyn Perking Entered By: Gwenlyn Perking on 12/03/2017 08:46:37 [name] ([id number]) -------------------------------------------------------------------------------- Lower Extremity Assessment Details Patient Name: [name] Date of Service: 12/03/2017 8:00 AM Medical Record Number: [id number] Patient Account Number: [id number] Date of Birth/Sex: 04-24-1946 (72 y.o. M) Treating RN: Renne Crigler Primary Care Royale Lennartz: [name] Other Clinician: Referring Brennley Curtice: [name] Treating Kaedynce Tapp/Extender: STONE III, HOYT Weeks in Treatment: 98 Edema Assessment Assessed: [Left: No] [Right: No] Edema: [Left: N] [Right: o] Vascular Assessment Claudication: Claudication Assessment [Right:None] Pulses: Dorsalis Pedis Palpable: [Right:Yes] Posterior Tibial Extremity colors, hair growth, and conditions: Extremity Color: [Right:Hyperpigmented] Temperature of Extremity: [Right:Warm] Capillary Refill: [Right:< 3 seconds] Toe Nail Assessment Left: Right: Thick: Yes Discolored: Yes Deformed: No Improper Length and Hygiene: No Electronic Signature(s) Signed: 12/03/2017 4:09:12 PM By: Renne Crigler Entered By: Renne Crigler on 12/03/2017 08:08:26 [name] ([id number]) -------------------------------------------------------------------------------- Multi Wound Chart Details Patient Name: [name] Date of Service: 12/03/2017 8:00 AM Medical  Record Number: [id number] Patient Account Number: [id number] Date of Birth/Sex: [birth year] (72 y.o. M) Treating RN: [name] Primary Care Mariapaula Krist: [name] Other Clinician: Referring Ravenne Wayment: [name] Treating Karman Biswell/Extender: STONE III, HOYT Weeks in Treatment: 98 Vital Signs Height(in): 76 Pulse(bpm): 65 Weight(lbs): 238 Blood Pressure(mmHg): 134/63 Body Mass Index(BMI): 29 Temperature(F): 98.2 Respiratory Rate 18 (breaths/min): Photos: [1:No Photos] [2:No Photos] [N/A:N/A] Wound Location: [1:Right Malleolus - Lateral] [2:Right Malleolus - Medial] [N/A:N/A] Wounding Event: [1:Gradually Appeared] [2:Gradually Appeared] [N/A:N/A] Primary Etiology: [1:Venous Leg Ulcer] [2:Venous Leg Ulcer] [N/A:N/A] Comorbid History: [1:Cataracts, Chronic Obstructive Cataracts, Chronic Obstructive N/A Pulmonary Disease (COPD), Pulmonary Disease (COPD), Osteoarthritis] [2:Osteoarthritis] Date Acquired: [1:08/11/2015] [2:01/30/2016] [N/A:N/A] Weeks of Treatment: [1:98] [2:96] [N/A:N/A]  Wound Status: [1:Open] [2:Open] [N/A:N/A] Clustered Wound: [1:No] [2:Yes] [N/A:N/A] Measurements L x W x D [1:6x5.8x0.2] [2:4x3.5x0.1] [N/A:N/A] (cm) Area (cm) : [1:27.332] [2:10.996] [N/A:N/A] Volume (cm) : [1:5.466] [2:1.1] [N/A:N/A] % Reduction in Area: [1:-58.20%] [2:4.80%] [N/A:N/A] % Reduction in Volume: [1:-5.40%] [2:4.80%] [N/A:N/A] Classification: [1:Full Thickness Without Exposed Support Structures] [2:Full Thickness Without Exposed Support Structures] [N/A:N/A] Exudate Amount: [1:Large] [2:Medium] [N/A:N/A] Exudate Type: [1:Serosanguineous] [2:Serous] [N/A:N/A] Exudate Color: [1:red, brown] [2:amber] [N/A:N/A] Wound Margin: [1:Distinct, outline attached] [2:Distinct, outline attached] [N/A:N/A] Granulation Amount: [1:Small (1-33%)] [2:Medium (34-66%)] [N/A:N/A] Granulation Quality: [1:Red] [2:Red] [N/A:N/A] Necrotic Amount: [1:Large (67-100%)] [2:Medium (34-66%)] [N/A:N/A] Exposed  Structures: [1:Fat Layer (Subcutaneous Tissue) Exposed: Yes Fascia: No Tendon: No Muscle: No Joint: No Bone: No] [2:Fat Layer (Subcutaneous Tissue) Exposed: Yes Fascia: No Tendon: No Muscle: No Joint: No Bone: No] [N/A:N/A] Epithelialization: [1:Small (1-33%)] [2:None] [N/A:N/A] Periwound Skin Texture: [1:Excoriation: No Induration: No Callus: No] [2:Excoriation: No Induration: No Callus: No] [N/A:N/A] Crepitus: No Crepitus: No Rash: No Rash: No Scarring: No Scarring: No Periwound Skin Moisture: Maceration: No Maceration: No N/A Dry/Scaly: No Dry/Scaly: No Periwound Skin Color: Atrophie Blanche: No Atrophie Blanche: No N/A Cyanosis: No Cyanosis: No Ecchymosis: No Ecchymosis: No Erythema: No Erythema: No Hemosiderin Staining: No Hemosiderin Staining: No Mottled: No Mottled: No Pallor: No Pallor: No Rubor: No Rubor: No Temperature: No Abnormality No Abnormality N/A Tenderness on Palpation: Yes Yes N/A Wound Preparation: Ulcer Cleansing: Ulcer Cleansing: N/A Rinsed/Irrigated with Saline Rinsed/Irrigated with Saline Topical Anesthetic Applied: Topical Anesthetic Applied: Other: lidocaine 4% Other: lidocaine 4% Treatment Notes Electronic Signature(s) Signed: 12/03/2017 5:03:17 PM By: Alejandro MullingPinkerton, Debra Entered By: Alejandro MullingPinkerton, Debra on 12/03/2017 08:20:14 [name], [name] ([id number]) -------------------------------------------------------------------------------- Multi-Disciplinary Care Plan Details Patient Name: [name], [name] Date of Service: 12/03/2017 8:00 AM Medical Record Number: [id number] Patient Account Number: [id number] Date of Birth/Sex: 1946/06/05 29(71 y.o. M) Treating RN: Phillis HaggisPinkerton, [name] Primary Care Miamor Ayler: Darreld McleanMILES, LINDA Other Clinician: Referring Dennis Hegeman: Darreld McleanMILES, LINDA Treating Nahun Kronberg/Extender: STONE III, HOYT Weeks in Treatment: 98 Active Inactive ` Venous Leg Ulcer Nursing Diagnoses: Knowledge deficit related to disease process and  management Goals: Patient will maintain optimal edema control Date Initiated: 04/03/2016 Target Resolution Date: 11/24/2016 Goal Status: Active Interventions: Compression as ordered Treatment Activities: Therapeutic compression applied : 04/03/2016 Notes: ` Wound/Skin Impairment Nursing Diagnoses: Impaired tissue integrity Goals: Ulcer/skin breakdown will heal within 14 weeks Date Initiated: 01/17/2016 Target Resolution Date: 11/24/2016 Goal Status: Active Interventions: Assess ulceration(s) every visit Notes: Electronic Signature(s) Signed: 12/03/2017 5:03:17 PM By: Alejandro MullingPinkerton, Debra Entered By: Alejandro MullingPinkerton, Debra on 12/03/2017 08:20:01 [name], [name] ([id number]) -------------------------------------------------------------------------------- Pain Assessment Details Patient Name: [name], [name] Date of Service: 12/03/2017 8:00 AM Medical Record Number: [id number] Patient Account Number: [id number] Date of Birth/Sex: 1946/06/05 50(71 y.o. M) Treating RN: Renne CriglerFlinchum, Cheryl Primary Care Kaylee Wombles: Darreld McleanMILES, LINDA Other Clinician: Referring Kashari Chalmers: Darreld McleanMILES, LINDA Treating Mathew Postiglione/Extender: STONE III, HOYT Weeks in Treatment: 98 Active Problems Location of Pain Severity and Description of Pain Patient Has Paino No Site Locations With Dressing Change: Yes Character of Pain Pain Management and Medication Current Pain Management: Electronic Signature(s) Signed: 12/03/2017 4:09:12 PM By: Renne CriglerFlinchum, Cheryl Entered By: Renne CriglerFlinchum, Cheryl on 12/03/2017 08:00:30 [name], [name] ([id number]) -------------------------------------------------------------------------------- Patient/Caregiver Education Details Patient Name: [name], [name] Date of Service: 12/03/2017 8:00 AM Medical Record Number: [id number] Patient Account Number: [id number] Date of Birth/Gender: 1946/06/05 22(71 y.o. M) Treating RN: Renne CriglerFlinchum, Cheryl Primary Care Physician: Darreld McleanMILES, LINDA Other Clinician: Referring  Physician: Darreld McleanMILES, LINDA Treating Physician/Extender: Linwood DibblesSTONE III, HOYT Weeks in Treatment: 4898 Education Assessment Education Provided  To: Patient Education Topics Provided Wound Debridement: Handouts: Wound Debridement Methods: Explain/Verbal Responses: State content correctly Wound/Skin Impairment: Handouts: Caring for Your Ulcer Methods: Explain/Verbal Responses: State content correctly Electronic Signature(s) Signed: 12/03/2017 4:09:12 PM By: Renne Crigler Entered By: Renne Crigler on 12/03/2017 08:43:12 [name] ([id number]) -------------------------------------------------------------------------------- Wound Assessment Details Patient Name: [name] Date of Service: 12/03/2017 8:00 AM Medical Record Number: [id number] Patient Account Number: [id number] Date of Birth/Sex: [birth year] (72 y.o. M) Treating RN: Renne Crigler Primary Care Kinlee Garrison: [name] Other Clinician: Referring Claretta Kendra: [name] Treating Rielly Corlett/Extender: STONE III, HOYT Weeks in Treatment: 98 Wound Status Wound Number: 1 Primary Venous Leg Ulcer Etiology: Wound Location: Right Malleolus - Lateral Wound Open Wounding Event: Gradually Appeared Status: Date Acquired: 08/11/2015 Comorbid Cataracts, Chronic Obstructive Pulmonary Weeks Of Treatment: 98 History: Disease (COPD), Osteoarthritis Clustered Wound: No Photos Wound Measurements Length: (cm) 6 Width: (cm) 5.8 Depth: (cm) 0.2 Area: (cm) 27.332 Volume: (cm) 5.466 % Reduction in Area: -58.2% % Reduction in Volume: -5.4% Epithelialization: Small (1-33%) Tunneling: No Undermining: No Wound Description Full Thickness Without Exposed Support Foul Odo Classification: Structures Slough/F Wound Margin: Distinct, outline attached Exudate Medium Amount: Exudate Type: Serosanguineous Exudate Color: red, brown r After Cleansing: No ibrino Yes Wound Bed Granulation Amount: Small (1-33%) Exposed  Structure Granulation Quality: Red Fascia Exposed: No Necrotic Amount: Large (67-100%) Fat Layer (Subcutaneous Tissue) Exposed: Yes Necrotic Quality: Adherent Slough Tendon Exposed: No Muscle Exposed: No Joint Exposed: No Bone Exposed: No Periwound Skin Texture Bekker, Rushawn C. ([id number]) Texture Color No Abnormalities Noted: No No Abnormalities Noted: No Callus: No Atrophie Blanche: No Crepitus: No Cyanosis: No Excoriation: No Ecchymosis: No Induration: No Erythema: No Rash: No Hemosiderin Staining: No Scarring: No Mottled: No Pallor: No Moisture Rubor: No No Abnormalities Noted: No Dry / Scaly: No Temperature / Pain Maceration: No Temperature: No Abnormality Tenderness on Palpation: Yes Wound Preparation Ulcer Cleansing: Rinsed/Irrigated with Saline Topical Anesthetic Applied: Other: lidocaine 4%, Treatment Notes Wound #1 (Right, Lateral Malleolus) 1. Cleansed with: Clean wound with Normal Saline 2. Anesthetic Topical Lidocaine 4% cream to wound bed prior to debridement 4. Dressing Applied: Hydrogel Prisma Ag 5. Secondary Dressing Applied ABD Pad Dry Gauze Notes xtrasorb, kerlix wrap with tape and darko offloading shoe Electronic Signature(s) Signed: 12/03/2017 4:45:52 PM By: Alejandro Mulling Signed: 12/04/2017 12:48:09 PM By: Renne Crigler Previous Signature: 12/03/2017 4:09:12 PM Version By: Renne Crigler Entered By: Alejandro Mulling on 12/03/2017 16:45:49 [name] ([id number]) -------------------------------------------------------------------------------- Wound Assessment Details Patient Name: [name] Date of Service: 12/03/2017 8:00 AM Medical Record Number: [id number] Patient Account Number: [id number] Date of Birth/Sex: 14-Jan-1946 (72 y.o. M) Treating RN: Renne Crigler Primary Care Nickoles Gregori: [name] Other Clinician: Referring Jerita Wimbush: [name] Treating Aerianna Losey/Extender: STONE III, HOYT Weeks in  Treatment: 98 Wound Status Wound Number: 2 Primary Venous Leg Ulcer Etiology: Wound Location: Right Malleolus - Medial Wound Open Wounding Event: Gradually Appeared Status: Date Acquired: 01/30/2016 Comorbid Cataracts, Chronic Obstructive Pulmonary Weeks Of Treatment: 96 History: Disease (COPD), Osteoarthritis Clustered Wound: Yes Photos Wound Measurements Length: (cm) 4 Width: (cm) 3.5 Depth: (cm) 0.1 Area: (cm) 10.996 Volume: (cm) 1.1 % Reduction in Area: 4.8% % Reduction in Volume: 4.8% Epithelialization: None Tunneling: No Undermining: No Wound Description Full Thickness Without Exposed Support Foul Odo Classification: Structures Slough/F Wound Margin: Distinct, outline attached Exudate Medium Amount: Exudate Type: Serous Exudate Color: amber r After Cleansing: No ibrino Yes Wound Bed Granulation Amount: Medium (34-66%) Exposed Structure Granulation Quality: Red Fascia Exposed: No Necrotic  Amount: Medium (34-66%) Fat Layer (Subcutaneous Tissue) Exposed: Yes Necrotic Quality: Adherent Slough Tendon Exposed: No Muscle Exposed: No Joint Exposed: No Bone Exposed: No Periwound Skin Texture Pelfrey, Jaber C. ([id number]) Texture Color No Abnormalities Noted: No No Abnormalities Noted: No Callus: No Atrophie Blanche: No Crepitus: No Cyanosis: No Excoriation: No Ecchymosis: No Induration: No Erythema: No Rash: No Hemosiderin Staining: No Scarring: No Mottled: No Pallor: No Moisture Rubor: No No Abnormalities Noted: No Dry / Scaly: No Temperature / Pain Maceration: No Temperature: No Abnormality Tenderness on Palpation: Yes Wound Preparation Ulcer Cleansing: Rinsed/Irrigated with Saline Topical Anesthetic Applied: Other: lidocaine 4%, Treatment Notes Wound #2 (Right, Medial Malleolus) 1. Cleansed with: Clean wound with Normal Saline 2. Anesthetic Topical Lidocaine 4% cream to wound bed prior to debridement 4. Dressing  Applied: Hydrogel Prisma Ag 5. Secondary Dressing Applied ABD Pad Dry Gauze Notes xtrasorb, kerlix wrap with tape and darko offloading shoe Electronic Signature(s) Signed: 12/03/2017 4:46:31 PM By: Alejandro Mulling Signed: 12/04/2017 12:48:09 PM By: Renne Crigler Previous Signature: 12/03/2017 4:09:12 PM Version By: Renne Crigler Entered By: Alejandro Mulling on 12/03/2017 16:46:30 [name] ([id number]) -------------------------------------------------------------------------------- Vitals Details Patient Name: [name] Date of Service: 12/03/2017 8:00 AM Medical Record Number: [id number] Patient Account Number: [id number] Date of Birth/Sex: [birth year] (72 y.o. M) Treating RN: Renne Crigler Primary Care Jaleeah Slight: [name] Other Clinician: Referring Elvie Maines: [name] Treating Lulubelle Simcoe/Extender: STONE III, HOYT Weeks in Treatment: 98 Vital Signs Time Taken: 08:10 Temperature (F): 98.2 Height (in): 76 Pulse (bpm): 65 Weight (lbs): 238 Respiratory Rate (breaths/min): 18 Body Mass Index (BMI): 29 Blood Pressure (mmHg): 134/63 Reference Range: 80 - 120 mg / dl Electronic Signature(s) Signed: 12/03/2017 4:09:12 PM By: Renne Crigler Entered By: Renne Crigler on 12/03/2017 08:01:22

## 2017-12-10 ENCOUNTER — Encounter: Payer: Medicare HMO | Admitting: Physician Assistant

## 2017-12-10 DIAGNOSIS — I87331 Chronic venous hypertension (idiopathic) with ulcer and inflammation of right lower extremity: Secondary | ICD-10-CM | POA: Diagnosis not present

## 2017-12-15 NOTE — Progress Notes (Signed)
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 medial malleoli region really does not bother him the right llateral malleoli region is more tender to palpation at this point in time especially in the central/inferior location. However  it does appear that the Santyl has done his job to loosen up the adherent slough at this point in time. Fortunately he has no interval signs or symptoms of infection locally or systemically no purulent discharge noted. 07/03/16 at this point in time today patient's wounds appear to be significantly improved over the right medial and lateral malleolus locations. He has much less tenderness at this point in time and the wounds appear clean her  although there is still adherent slough this is sufficiently improved over what I saw last week. I still see no evidence of local infection. 07/10/16; continued gradual improvement in the right medial and lateral malleolus locations. The lateral is more substantial wound now divided into 2 by a rim of normal epithelialization. 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 He denies any complaints of pain or intolerance to compression. Both ulcers are smaller in size oriented today's measurements, both are covered with a softly adherent slough. 07/24/16; medial wound is smaller, lateral about the same although surface looks better. Still using Hydrofera Blue 07/31/16; arrives today complaining of pain in the lateral part of his foot. Nurse reports a lot more drainage. He has been using Hydrofera Blue. Switch to silver alginate today 08/03/2016 -- I was asked to see the patient was here for a nurse visit today. I understand he had a lot of pain in his right lower extremity and was having blisters on his right foot which have not been there before. Though he started on doxycycline he does not have blisters elsewhere on his body. I do not believe this is a drug allergy. also mentioned that there was a copious purulent discharge from the wound and clinically there is no evidence of cellulitis. 08/07/16; I note that the patient came in for his nurse  check on Friday apparently with blisters on his toes on the right than a lot of swelling in his forefoot. He continued on the doxycycline that I had prescribed on 12/8. A culture was done of the lateral wound that showed a combination of a few Proteus and Pseudomonas. Doxycycline might of covered the Proteus but would be unlikely to cover the Pseudomonas. He is on Coumadin. He arrives in the clinic today feeling a lot better states the pain is a lot better but nothing specific really was done other than to rewrap the foot also noted that he had arterial studies ordered in August although these were never done. It is reasonable to go ahead and reorder these. 08/14/16; generally arrives in a better state today in terms of the wounds he has taken cefdinir for one week. Our intake nurse reports copious amounts of drainage but the patient is complaining of much less pain. He is not had his PT and INR checked and I've asked him to do this today or tomorrow. 08/24/2016 -- patient arrives today after 10 days and said he had a stomach upset. His arterial study was done and I have reviewed this report and find it to be within normal limits. However I did not note any venous duplex studies for reflux, and Dr. Leanord Hawking may have ordered these in the past but I will leave it to him to decide if he needs these. The patient has finished his course of cefdinir. 08/28/16; patient arrives today again with copious amounts of thick really green drainage for our intake nurse. He states he has a very tender spot at the superior part of the lateral wound. Wounds are larger 09/04/16; no real change in the condition of this patient's wound still copious amounts of surface slough. Started him on Iodoflex last week he is completing another course of Cefdinir or which I think was done empirically. His arterial study showed ABIs were 1.1 on the right 1.5 on the left. He did have a slightly reduced ABI in the right the left one  was  not obtained. Had calcification of the right posterior tibial artery. The interpretation was no segmental stenosis. His waveforms were triphasic. [name], [name] ([id number]) His reflux studies are later this month. Depending on this I'll send him for a vascular consultation, he may need to see plastic surgery as I believe he is had plastic surgery on this foot in the past. He had an injury to the foot in the 1980s. 1/16 /18 right lateral greater than right medial ankle wounds on the right in the setting of previous skin grafting. 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 He does not have arterial issues. Each week he comes in with the same adherent surface slough although there was less of this today 09/18/16; right lateral greater than right medial lower extremity wounds in the setting of previous skin grafting and trauma. He has least to vein laser ablation scheduled for February 2 for venous reflux. He does not have significant arterial disease. Problem has been very difficult to handle surface slough/necrotic tissue. Recently using Iodoflex for this with some, albeit slow improvement 09/25/16; right lateral greater than right medial lower extremity venous wounds in the setting of previous skin grafting. He is going for ablation surgery on February 2 after this he'll come back here for rewrap. He has been using Iodoflex as the primary dressing. 10/02/16; right lateral greater than right medial lower extremity wounds in the setting of previous skin grafting. He had his ablation surgery last week, I don't have a report. He tolerated this well. Came in with a thigh-high Unna boots on Friday. We have been using Iodoflex as the primary dressing. His measurements are improving 10/09/16; continues to make nice aggressive in terms of the wounds on his lateral and medial right ankle in the setting of previous skin  grafting. Yesterday he noticed drainage at one of his surgical sites from his venous ablation on the right calf. He took off the bandage over this area felt a "popping" sensation and a reddish-brown drainage. He is not complaining of any pain 10/16/16; he continues to make nice progression in terms of the wounds on the lateral and medial malleolus. Both smaller using Iodoflex. He had a surgical area in his posterior mid calf we have been using iodoform. All the wounds are down and dimensions 10/23/16; the patient arrives today with no complaints. He states the Iodoflex is a bit uncomfortable. He is not systemically unwell. We have been using Iodoflex to the lateral right ankle and the medial and Aquacel Ag to the reflux surgical wound on the posterior right calf. All of these wounds are doing well 10/30/16; patient states he has no pain no systemic symptoms. I changed him to [facility] last week. Although the wounds are doing well 11/06/16; patient reports no pain or systemic symptoms. We continue with Hydrofera Blue. Both wound areas on the medial and lateral ankle appear to be doing well with improvement and dimensions and improvement in the wound bed. 11/13/16; patient's dimensions continued to improve. We continue with Hydrofera Blue on the medial and lateral side. Appear to be doing well with healthy granulation and advancing epithelialization 11/20/16; patient's dimensions improving laterally by about half a centimeter in length. Otherwise no change on the medial side. Using [facility] 12/04/16; no major change in patient's wound dimensions. Intake nurse reports more drainage. 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 He is voicing no complaints or concerns. He has been tolerating his twice weekly compression therapy changes 12/11/16 Patient complains of pain and increased drainage..  wants hydrofera blue 12/18/16 improvement. Sorbact 12/25/16; medial wound is smaller, lateral measures the same. Still on sorbact 01/01/17; medial wound continues to be smaller, lateral measures about the same however there is clearly advancing epithelialization here as well in fact I think the wound will ultimately divided into 2 open areas 01/08/17; unfortunately today fairly significant regression in several areas. Surface of the lateral wound covered again in adherent necrotic material which is difficult to debridement. He has significant surrounding skin maceration. The expanding area of tissue epithelialization in the middle of the wound that was encouraging last week appears to be smaller. There is no surrounding tenderness. The area on the medial leg also did not seem to be as healthy as last week, the reason for this regression this week is not totally clear. We have been using Sorbact for the last 4 weeks. We'll switched of polymen AG which we will order via home medical supply. If there is a problem with this would switch back to Iodoflex 01/15/18; drainage,odor. No change. Switched to polymen last week 01/22/17; still continuous drainage. Culture I did last week showed a few Proteus pansensitive. I did this culture because of drainage. Put him on Augmentin which she has been taking since Saturday however he is developed 4-5 liquid bowel movements. He is also on Coumadin. Beyond this wound is not changed at all, still nonviable necrotic surface material which I debrided reveals healthy granulation line 01/29/17; still copious amounts of drainage reported by her intake nurse. Wound measuring slightly smaller. Currently fact on Iodoflex although I'm looking forward to changing back to perhaps [facility] or polymen AG 02/05/17; still large amounts of drainage and presenting with really large amounts of adherent slough and necrotic material over the remaining open area of the wound. We have been  using Iodoflex but with little improvement in the surface. Change to [facility] 02/12/17; still large amount of drainage. Much less adherent slough however. Started [employer] [place] last week [name], [name] ([id number]) 02/19/17; drainage is better this week. Much less adherent slough. Perhaps some improvement in dimensions. Using [facility] 02/26/17; severe venous insufficiency wounds on the right lateral and right medial leg. Drainage is some better and slough is less adherent we've been using Hydrofera Blue 03/05/17 on evaluation today patient appears to be doing well. His wounds have been decreasing in size and overall he is pleased with how this is progressing. We are awaiting approval for the epigraph which has previously been recommended in the meantime the [facility] Dressing is to be doing very well for him. 03/12/17; wound dimensions are smaller still using Hydrofera Blue. Comes in on Fridays for a dressing change. 03/19/17; wound dimensions continued to contract. Healing of this wound is complicated by continuous significant drainage as well as recurrent buildup of necrotic surface material. We looked into Apligraf and he has a $290 co-pay per application but truthfully I think the drainage as well as the nonviable surface would preclude use of Apligraf there are any other skin substitute at this point therefore the continued plan will be debridement each clinic visit, 2 times a week dressing changes and continued use of Hydrofera Blue. improvement has been very slow but sustained 03/26/17 perhaps slight improvements in peripheral epithelialization is especially inferiorly.  Still with large amount of drainage and tightly adherent necrotic surface on arrival. Along with the intake nurse I reviewed previous treatment. He worsened on Iodoflex and had a 4 week trial of sorbact, Polymen AG and a long courses of Aquacel Ag. He is not a candidate for advanced treatment options for many  reasons 04/09/17 on evaluation today patient's right lower extremity wounds appear to be doing a little better. Fortunately he has no significant discomfort and has been tolerating the dressing changes including the wraps without complication. With that being said he really does not have swelling anymore compared to what he has had in the past following his vascular intervention. He wonders if potentially we could attempt avoiding the rats to see if he could cleanse the wound in between to try to prevent some of the fiber and its buildup that occurs in the interim between when we see him week to week. No fevers, chills, nausea, or vomiting noted at this time. 04/16/17; noted that the staff made a choice last week not to put him in compression. The patient is changing his dressing at home using [facility] and changing every second day. His wife is washing the wound with saline. He is using kerlix. Surprisingly he has not developed a lot of edema. This choice was made because of the degree of fluid retention and maceration even with changing his dressing twice a week 04/23/17; absolutely no change. Using Hydrofera Blue. Recurrent tightly adherent nonviable surface material. This is been refractory to Iodoflex, sorbact and now Hydrofera Blue. More recently he has been changing his own dressings at home, cleansing the wound in the shower. He has not developed lower extremity edema 04/30/17; if anything the wound is larger still in adherent surface although it debrided easily today. I've been using Mehta honey for 1 week and I'd like to try and do it for a second week this see if we can get some form of viable surface here. 05/07/17; patient arrives with copious amounts of drainage and some pain in the superior part of the wound. He has not been systemically unwell. He's been changing this daily at home 05/14/17; patient arrives today complaining of less drainage and less pain. Dimensions slightly better.  Surface culture I did of this last week grew MSSA I put him on dicloxacillin for 10 days. Patient requesting a further prescription since he feels so much better. He did not obtain the [facility] for reasons that aren't clear, he has been using Hydrofera Blue 05/21/17; perhaps some less drainage and less pain. He is completing another week of doxycycline. Unfortunately there is no change in the wound measurements are appearance still tightly adherent necrotic surface material that is really defied treatment. We have been using Hydrofera Blue most recently. He did not manage to get Anasept. He was told it was prescription at [facility] 05/29/17; absolutely no improvement in these wound areas. He had remote plastic surgery with who was done at [facility] in [place] surrounding this area. This was a traumatic wound with extensive plastic surgery/skin grafting at that time. I switched him to sorbact last week to see if he can do anything about the recurrent necrotic surface and drainage. This is largely been refractory to Iodoflex/Hydrofera Blue/alginates. I believe we tried Elby Showers and more recently Medi honey l however the drainage is really too excessive 06/04/17; patient went to see Dr. Ulice Bold. Per the patient she ordered him a compression stocking. Continued the Anasept gel and sorbact was already  on. No major improvement in the condition of the wounds in fact the medial wound looks larger. Again per the patient she did not feel a skin graft or operative debridement was indicated The patient had previous venous ablation in February 2018. Last arterial studies were in December 2017 and were within normal limits 06/18/17; patient arrives back in clinic today using Anasept gel with sorbact. He changes this every day is wearing his own compression stocking. The patient actually saw Dr. Leta Baptist of plastic surgery. I've reviewed her note. The appointment was on 05/30/17. The basic issue with  here was that she did not feel that skin grafts for patients with underlying stasis and edema have a good long-term success rate. She also discuss skin substitutes which has been done here as well however I have not been able to get the surface of these wounds to something that I think would support skin substitutes. This is why he felt he might need an JAKOB, KIMBERLIN. ([id number]) operative debridement more aggressively than I can do in this clinic Review of systems; is otherwise negative he feels well 07/02/17; patient has been using Anasept gel with Sorbact. He changes this every day scrubs out the wound beds. Arrives today with the wound bed looking some better. Easier debridement 07/16/17; patient has been using Anasept gel was sorbact for about a month now. The necrotic service on his wound bed is better and the drainage is now down to minimal but we've not made any improvements in the epithelialization. Change to Santyl today. Surface of the wound is still not good enough to support skin substitutes 07/30/17 on evaluation today patient appears to be doing very well in regard to his right bimalleolus wounds. He has been tolerating the treatment with the Anasept gel and sorbact. However we were going to try and switch to Santyl to be more aggressive with these wounds. This was however cost prohibitive. Therefore we will likely go back to the sorbact. Nonetheless he is not having any significant discomfort he still is forming slough over the wound location but nothing too significant. The wounds do appear to be filling in nicely. 08/13/17; I have not seen this wound in about a month. There is not much change. The fact the area medially looks larger. He has been using sorbact and Anasept for over a month now without much effect. Arrives in clinic stating that he does not want the area debrided 08/28/17; patient arrives with the areas on his right medial and right lateral ankle worse. The wounds  of expanded there is erythema and still drainage. There is no pain and no tenderness. We had been using Keracel AG clearly not doing well. 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 He states that even the evening that we began utilizing the Iodoflex he started having burning. Subsequently this never really improved and he eventually discontinue the use of the Iodoflex altogether. Unfortunately he did not let us know about any of this until today. I'm unsure of any specific infection at this point although I am going to obtain a wound culture today in order to see if there's anything that could potentially be causing an infection as well. It does sound as if  he's had the Iodoflex before and did not have this kind of reaction although this does sound very specific for a reaction to the Iodoflex. 09/17/17 on evaluation today patient appears to be doing significantly better compared to last week's evaluation. At that time it actually appears that he did have an infection the good news is that we have been able to get this under control with switching to the [facility] solution he's not having as much pain and discomfort he still does have some erythema surrounding the medial aspect wound. He has been tolerating the Dakin's soaked gauze dressing which is excellent and that his wounds do not seem to have as much adherent slough. Overall I'm pleased with the progress she's made in last week. 11/05/17 on evaluation today patient appears to be doing better in regard to his right medial and lateral malleolus ulcers. He has been tolerating the dressing changes without complication. I do flight the Freada Bergeron is helping with additional granulation at this  point in the wound beds do appear to be a little bit better. With that being said patient does not appear to have any evidence of significant infection at this point there is no erythema as he has previously noted he does note that the 30 day course of antibiotics I placed him on will end tomorrow. 11/12/17 on evaluation today patient actually appears to be doing excellent in regard to his right medial and lateral malleolus ulcers. He has been tolerating the dressing changes without complication. Fortunately he has no evidence of infection at this time and overall I believe he is progressing extremely nicely he has a lot of new epithelialization noted on evaluation today. Patient likewise is very pleased with the progress even just since last week. 11/19/17 on evaluation today patient appears to be doing about the same in regard to his ulcerations. He does not have any additional breakdown although he really has not noted any significant improvement either over the past week. With that being said the more concerning thing at this time is that he is beginning to show signs of erythema surrounding both wounds which is what typically happens and then he will develop into a more overt infection. This has been the course for some time. I hope that doing the 30 day in a biotic cycle this past time would break that so that he would not have the same type of thing happen again. Nonetheless it appears that is digressing back into this infected state unfortunately. With that being said he is not having any pain currently which is good he typically does start to hurt more as this progresses. Fortunately there does not appear to be any evidence of infection systemically which is good news. 11/26/17 on evaluation today patient appears to actually be doing rather well in regard to his ulcer compared to last week. He still has your theme and noted around both the medial and lateral malleolus ulcers of the right lower  extremity. He does not seem to be quite as overtly infected however. He has been tolerating the dressing changes without complication which is good news. The gentamicin cream does seem to have been of benefit for him. With that being said he does actually have an appointment with Dr. Sampson Goon this morning to see if there's anything that would be recommended in the way of IV antibiotic [name], [name] ([id number]) therapy. Otherwise he still has slough noted on the surface of the wound. 12/03/17 on evaluation today patient presents for follow-up  concerning his ongoing right lower extremity ulcers. He has been tolerating the dressing changes without complication he states he has not really been using gentamicin on the lateral ankle and this seems to be doing very well. For that reason I think that is probably fine to put a hold on the gentamicin he states his burns and stings when he applies it and we maybe be able to just use this intermittently when things become more irritated. Other than that the good news is I did review his MRI which was performed on 11/28/17 and revealed that he has a negative MRI for abscess, osteomyelitis, or septic joint. Obviously these were the issues that I was concerned with the possibility of and I'm pleased to find that there is no problems in that regard. I did also review Dr. Jarrett Ables note from infectious disease he discussed performed some lab work which apparently was done and then subsequently was going to consider the possibility of Keflex long-term for prevention of infection although this has not been prescribed as of yet. 12/09/17 on evaluation today patient appears to be doing a little bit worse in regard to his right medial and lateral malleolus ulcers. Fortunately the wound does not seem to be significantly worse although he does have a lot more drainage especially the lateral aspect he is having a lot more pain than what he has noted more recently.  Fortunately there does not appear to be any evidence of systemic infection which is good news. Again he has seen Dr. Sampson Goon but he has not been placed on the potential Keflex which was recommended as a possibility at least yet. Patient History Information obtained from Patient. Social History Former smoker - 10 years ago, Marital Status - Married, Alcohol Use - Moderate, Drug Use - No History, Caffeine Use - Never. Medical And Surgical History Notes Constitutional Symptoms (General Health) Vein Filter (groin area); CODA; H/O Blood Clots; pulmonary hypertensive arterial disease Review of Systems (ROS) Constitutional Symptoms (General Health) Denies complaints or symptoms of Fever, Chills. Respiratory The patient has no complaints or symptoms. Cardiovascular Complains or has symptoms of LE edema. Psychiatric The patient has no complaints or symptoms. Objective Constitutional Well-nourished and well-hydrated in no acute distress. Vitals Time Taken: 8:15 AM, Height: 76 in, Weight: 238 lbs, BMI: 29, Temperature: 98.4 F, Pulse: 72 bpm, Respiratory Rate: 18 breaths/min, Blood Pressure: 125/59 mmHg. Respiratory normal breathing without difficulty. [name], [name] ([id number]) Psychiatric this patient is able to make decisions and demonstrates good insight into disease process. Alert and Oriented x 3. pleasant and cooperative. General Notes: Patient's wound does continue to show signs of slough covering at this point and it was marked painful to debride today than last week or several weeks prior even. In fact he really has not had as much discomfort as he does right now since I last treated him with the 30 days of antibiotics roughly starting two months ago. Post debridement the wounds did appear to be both much better at this point he does note he's been having a lot more drainage as well. Integumentary (Hair, Skin) Wound #1 status is Open. Original cause of wound was Gradually  Appeared. The wound is located on the Right,Lateral Malleolus. The wound measures 6cm length x 6.5cm width x 0.2cm depth; 30.631cm^2 area and 6.126cm^3 volume. There is Fat Layer (Subcutaneous Tissue) Exposed exposed. There is no tunneling or undermining noted. There is a large amount of serosanguineous drainage noted. The wound margin is distinct with the outline  attached to the wound base. There is small (1-33%) red granulation within the wound bed. There is a large (67-100%) amount of necrotic tissue within the wound bed including Adherent Slough. The periwound skin appearance did not exhibit: Callus, Crepitus, Excoriation, Induration, Rash, Scarring, Dry/Scaly, Maceration, Atrophie Blanche, Cyanosis, Ecchymosis, Hemosiderin Staining, Mottled, Pallor, Rubor, Erythema. Periwound temperature was noted as No Abnormality. The periwound has tenderness on palpation. Wound #2 status is Open. Original cause of wound was Gradually Appeared. The wound is located on the Right,Medial Malleolus. The wound measures 4cm length x 4cm width x 0.1cm depth; 12.566cm^2 area and 1.257cm^3 volume. There is Fat Layer (Subcutaneous Tissue) Exposed exposed. There is no tunneling or undermining noted. There is a large amount of serous drainage noted. The wound margin is distinct with the outline attached to the wound base. There is medium (34-66%) red granulation within the wound bed. There is a medium (34-66%) amount of necrotic tissue within the wound bed including Adherent Slough. The periwound skin appearance did not exhibit: Callus, Crepitus, Excoriation, Induration, Rash, Scarring, Dry/Scaly, Maceration, Atrophie Blanche, Cyanosis, Ecchymosis, Hemosiderin Staining, Mottled, Pallor, Rubor, Erythema. Periwound temperature was noted as No Abnormality. The periwound has tenderness on palpation. 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 There was a Excisional Skin/Subcutaneous Tissue Debridement with a total area of 39 sq cm performed by STONE III, Indria Bishara E., PA-C. With the following instrument(s): Curette. to remove Viable and Non-Viable tissue/material Material removed includes Subcutaneous Tissue, and Slough, Fibrin/Exudate, and [place] after achieving pain control using Lidocaine 4% Topical Solution. No specimens were taken. A time out was conducted at 08:29, prior to the start of the procedure. A Minimum amount of bleeding was controlled with Pressure. The procedure was tolerated well with a pain level of 0 throughout and a pain level of 0 following the procedure. Post Debridement Measurements: 6cm length x 6.5cm [name], [name] ([id number]) width x 0.3cm depth; 9.189cm^3 volume. Character of Wound/Ulcer Post Debridement requires further debridement. Severity of Tissue Post Debridement is: Fat layer exposed. 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 There was a Excisional Skin/Subcutaneous Tissue Debridement with a total area of 16 sq cm performed by STONE III, Khaidyn Staebell E., PA-C. With the following instrument(s): Curette. to remove Viable and Non-Viable tissue/material Material removed includes Subcutaneous Tissue, and Slough, Fibrin/Exudate, and [place]ough after achieving  pain control using Lidocaine 4% Topical Solution. No specimens were taken. A time out was conducted at 08:29, prior to the start of the procedure. A Minimum amount of bleeding was controlled with Pressure. The procedure was tolerated well with a pain level of 0 throughout and a pain level of 0 following the procedure. Post Debridement Measurements: 4cm length x 4cm width x 0.2cm depth; 2.513cm^3 volume. Character of Wound/Ulcer Post Debridement requires further debridement. Severity of Tissue Post Debridement is: Fat layer exposed. Post procedure Diagnosis Wound #2: Same as Pre-Procedure Plan Currently I'm  gonna recommend that he use a thin layer of the gentamicin cream prior to dressing applications in order to help with what appears to be an infection currently. Patient is in agreement with plan. Subsequently we also gonna contact Dr. Jarrett Ables office and talk to [facility] his nurse to see if there's anything they can do from the standpoint of oral medications they feel may be appropriate especially based on his lab work and everything that Dr. Sampson Goon has reviewed. Patient is in agreement with this plan. We will switch this week to the silver alginate dressing as well to see if that will be of benefit for him. Patient is in agreement with this plan. Please see above for specific wound care orders. We will see patient for re-evaluation in 1 week(s) here in the clinic. If anything worsens or changes patient will contact our office for additional recommendations. Electronic Signature(s) Signed: 12/11/2017 12:00:18 AM By: Lenda Kelp PA-C Entered By: Lenda Kelp on 12/10/2017 08:39:35 [name] ([id number]) -------------------------------------------------------------------------------- ROS/PFSH Details Patient Name: [name] Date of Service: 12/10/2017 8:00 AM Medical Record Number: [id number] Patient Account Number: [id number] Date of Birth/Sex: 1946/05/21 (72  y.o. M) Treating RN: [name] Primary Care Provider: [name] Other Clinician: Referring Provider: [name] Treating Provider/Extender: STONE III, Melah Ebling Weeks in Treatment: 99 Information Obtained From Patient Wound History Do you currently have one or more open woundso Yes How many open wounds do you currently haveo 1 Approximately how long have you had your woundso 5 months How have you been treating your wound(s) until nowo saline, dressing Has your wound(s) ever healed and then re-openedo Yes Have you had any lab work done in the past montho No Have you tested positive for an antibiotic resistant organism (MRSA, VRE)o No Have you tested positive for osteomyelitis (bone infection)o No Have you had any tests for circulation on your legso No Constitutional Symptoms (General Health) Complaints and Symptoms: Negative for: Fever; Chills Medical History: Past Medical History Notes: Vein Filter (groin area); CODA; H/O Blood Clots; pulmonary hypertensive arterial disease Cardiovascular Complaints and Symptoms: Positive for: LE edema Medical History: Negative for: Angina; Arrhythmia; Congestive Heart Failure; Coronary Artery Disease; Deep Vein Thrombosis; Hypertension; Hypotension; Myocardial Infarction; Peripheral Arterial Disease; Peripheral Venous Disease; Phlebitis; Vasculitis Eyes Medical History: Positive for: Cataracts - removed Negative for: Glaucoma; Optic Neuritis Ear/Nose/Mouth/Throat Medical History: Negative for: Chronic sinus problems/congestion Hematologic/Lymphatic Medical History: Negative for: Anemia; Hemophilia; Human Immunodeficiency Virus; Lymphedema; Sickle Cell Disease Respiratory Cabell, Rui C. ([id number]) Complaints and Symptoms: No Complaints or Symptoms Medical History: Positive for: Chronic Obstructive Pulmonary Disease (COPD) Negative for: Aspiration; Asthma; Pneumothorax; Sleep Apnea; Tuberculosis Gastrointestinal Medical  History: Negative for: Cirrhosis ; Colitis; Crohnos; Hepatitis A; Hepatitis B; Hepatitis C Endocrine Medical History: Negative for: Type I Diabetes; Type II Diabetes Genitourinary Medical History: Negative for: End Stage Renal Disease Immunological Medical History: Negative for: Lupus Erythematosus; Raynaudos Integumentary (Skin) Medical History: Negative for: History of Burn; History of pressure wounds Musculoskeletal Medical History: Positive for: Osteoarthritis Negative for: Gout; Rheumatoid Arthritis; Osteomyelitis Neurologic Medical History: Negative for: Dementia; Neuropathy; Quadriplegia; Paraplegia; Seizure Disorder Oncologic Medical History: Negative for: Received Chemotherapy; Received Radiation Psychiatric Complaints and Symptoms: No Complaints or Symptoms Medical History: Negative for: Anorexia/bulimia; Confinement Anxiety HBO Extended History Items Eyes: Cataracts Poth, Vitor C. ([id number]) Immunizations Pneumococcal Vaccine: Received Pneumococcal Vaccination: No Implantable Devices Family and Social History Former smoker - 10 years ago; Marital Status - Married; Alcohol Use: Moderate; Drug Use: No History; Caffeine Use:  Never; Advanced Directives: No; Patient does not want information on Advanced Directives; Living Will: No; Medical Power of Attorney: No Physician Affirmation I have reviewed and agree with the above information. Electronic Signature(s) Signed: 12/10/2017 3:21:08 PM By: Alejandro Mulling Signed: 12/11/2017 12:00:18 AM By: Lenda Kelp PA-C Entered By: Lenda Kelp on 12/10/2017 08:38:04 [name] ([id number]) -------------------------------------------------------------------------------- SuperBill Details Patient Name: [name] Date of Service: 12/10/2017 Medical Record Number: [id number] Patient Account Number: [id number] Date of Birth/Sex: 1946-02-08 (73 y.o. M) Treating RN: [name] Primary  Care Provider: [name] Other Clinician: Referring Provider: [name] Treating Provider/Extender: STONE III, Che Below Weeks in Treatment: 99 Diagnosis Coding ICD-10 Codes Code Description I87.331 Chronic venous hypertension (idiopathic) with ulcer and inflammation of right lower extremity L97.312 Non-pressure chronic ulcer of right ankle with fat layer exposed T85.613A Breakdown (mechanical) of artificial skin graft and decellularized allodermis, initial encounter T81.31XD Disruption of external operation (surgical) wound, not elsewhere classified, subsequent encounter Facility Procedures CPT4 Code: [id number] Description: 11042 - DEB SUBQ TISSUE 20 SQ CM/< ICD-10 Diagnosis Description L97.312 Non-pressure chronic ulcer of right ankle with fat layer expo Modifier: sed Quantity: 1 CPT4 Code: [id number] Description: 11045 - DEB SUBQ TISS EA ADDL 20CM ICD-10 Diagnosis Description L97.312 Non-pressure chronic ulcer of right ankle with fat layer expo Modifier: sed Quantity: 2 Physician Procedures CPT4: Description Modifier Quantity Code [id number] 99214 - WC PHYS LEVEL 4 - EST PT 25 1 ICD-10 Diagnosis Description I87.331 Chronic venous hypertension (idiopathic) with ulcer and inflammation of right lower extremity L97.312 Non-pressure chronic ulcer  of right ankle with fat layer exposed T85.613A Breakdown (mechanical) of artificial skin graft and decellularized allodermis, initial encounter T81.31XD Disruption of external operation (surgical) wound, not elsewhere classified, subsequent encounter CPT4: 8413244 11042 - WC PHYS SUBQ TISS 20 SQ CM 1 ICD-10 Diagnosis Description L97.312 Non-pressure chronic ulcer of right ankle with fat layer exposed CPT4: 0102725 11045 - WC PHYS SUBQ TISS EA ADDL 20 CM 2 ICD-10 Diagnosis Description L97.312 Non-pressure chronic ulcer of right ankle with fat layer exposed QUARTEZ, LAGOS ([id number]) Electronic Signature(s) Signed: 12/11/2017 12:00:18 AM By: Lenda Kelp PA-C Entered By: Lenda Kelp on 12/10/2017 08:40:15

## 2017-12-16 NOTE — Progress Notes (Signed)
AHIJAH, DEVERY ([id number]) Visit Report for 12/10/2017 Arrival Information Details Patient Name: [name], [name]. Date of Service: 12/10/2017 8:00 AM Medical Record Number: [id number] Patient Account Number: [id number] Date of Birth/Sex: 1946/01/18 (72 y.o. M) Treating RN: Renne Crigler Primary Care Mirabella Hilario: [name] Other Clinician: Referring Yon Schiffman: [name] Treating Dakota Stangl/Extender: Linwood Dibbles, HOYT Weeks in Treatment: 99 Visit Information History Since Last Visit All ordered tests and consults were completed: No Patient Arrived: Ambulatory Added or deleted any medications: No Arrival Time: 08:10 Any new allergies or adverse reactions: No Accompanied By: self Had a fall or experienced change in No Transfer Assistance: None activities of daily living that may affect Patient Identification Verified: Yes risk of falls: Secondary Verification Process Yes Signs or symptoms of abuse/neglect since last visito No Completed: Hospitalized since last visit: No Patient Requires Transmission-Based No Implantable device outside of the clinic excluding No Precautions: cellular tissue based products placed in the center Patient Has Alerts: Yes since last visit: Patient Alerts: Patient on Blood Pain Present Now: Yes Thinner Electronic Signature(s) Signed: 12/11/2017 4:34:44 PM By: Renne Crigler Entered By: Renne Crigler on 12/10/2017 08:14:56 [name] ([id number]) -------------------------------------------------------------------------------- Encounter Discharge Information Details Patient Name: [name] Date of Service: 12/10/2017 8:00 AM Medical Record Number: [id number] Patient Account Number: [id number] Date of Birth/Sex: 12-31-1945 (72 y.o. M) Treating RN: [name] Primary Care Carlie Corpus: [name] Other Clinician: Referring Sabrinna Yearwood: [name] Treating Kamaree Wheatley/Extender: STONE III, HOYT Weeks in Treatment:  65 Encounter Discharge Information Items Discharge Pain Level: 0 Discharge Condition: Stable Ambulatory Status: Ambulatory Discharge Destination: Home Transportation: Private Auto Accompanied By: self Schedule Follow-up Appointment: Yes Medication Reconciliation completed and No provided to Patient/Care Francessca Friis: Patient Clinical Summary of Care: Declined Electronic Signature(s) Signed: 12/10/2017 3:03:21 PM By: Gwenlyn Perking Entered By: Gwenlyn Perking on 12/10/2017 08:53:24 [name] ([id number]) -------------------------------------------------------------------------------- Lower Extremity Assessment Details Patient Name: [name] Date of Service: 12/10/2017 8:00 AM Medical Record Number: [id number] Patient Account Number: [id number] Date of Birth/Sex: [birth year] (72 y.o. M) Treating RN: Renne Crigler Primary Care Berdina Cheever: [name] Other Clinician: Referring Jacayla Nordell: [name] Treating Kobey Sides/Extender: STONE III, HOYT Weeks in Treatment: 99 Edema Assessment Assessed: [Left: No] [Right: No] Edema: [Left: N] [Right: o] Vascular Assessment Claudication: Claudication Assessment [Right:None] Pulses: Dorsalis Pedis Palpable: [Right:Yes] Posterior Tibial Extremity colors, hair growth, and conditions: Extremity Color: [Right:Normal] Hair Growth on Extremity: [Right:Yes] Temperature of Extremity: [Right:Warm] Capillary Refill: [Right:< 3 seconds] Toe Nail Assessment Left: Right: Thick: Yes Discolored: Yes Deformed: Yes Improper Length and Hygiene: Yes Electronic Signature(s) Signed: 12/11/2017 4:34:44 PM By: Renne Crigler Entered By: Renne Crigler on 12/10/2017 08:19:07 [name] ([id number]) -------------------------------------------------------------------------------- Multi Wound Chart Details Patient Name: [name] Date of Service: 12/10/2017 8:00 AM Medical Record Number: [id number] Patient Account  Number: [id number] Date of Birth/Sex: 1945-12-13 (72 y.o. M) Treating RN: [name] Primary Care Leshay Desaulniers: [name] Other Clinician: Referring Jeneane Pieczynski: [name] Treating Cinch Ormond/Extender: STONE III, HOYT Weeks in Treatment: 99 Vital Signs Height(in): 76 Pulse(bpm): 72 Weight(lbs): 238 Blood Pressure(mmHg): 125/59 Body Mass Index(BMI): 29 Temperature(F): 98.4 Respiratory Rate 18 (breaths/min): Photos: [1:No Photos] [2:No Photos] [N/A:N/A] Wound Location: [1:Right Malleolus - Lateral] [2:Right Malleolus - Medial] [N/A:N/A] Wounding Event: [1:Gradually Appeared] [2:Gradually Appeared] [N/A:N/A] Primary Etiology: [1:Venous Leg Ulcer] [2:Venous Leg Ulcer] [N/A:N/A] Comorbid History: [1:Cataracts, Chronic Obstructive Cataracts, Chronic Obstructive N/A Pulmonary Disease (COPD), Pulmonary Disease (COPD), Osteoarthritis] [2:Osteoarthritis] Date Acquired: [1:08/11/2015] [2:01/30/2016] [N/A:N/A] Weeks of Treatment: [1:99] [2:97] [N/A:N/A] Wound Status: [1:Open] [2:Open] [N/A:N/A] Clustered  Wound: [1:No] [2:Yes] [N/A:N/A] Measurements L x W x D [1:6x6.5x0.2] [2:4x4x0.1] [N/A:N/A] (cm) Area (cm) : [1:30.631] [2:12.566] [N/A:N/A] Volume (cm) : [1:6.126] [2:1.257] [N/A:N/A] % Reduction in Area: [1:-77.30%] [2:-8.80%] [N/A:N/A] % Reduction in Volume: [1:-18.20%] [2:-8.80%] [N/A:N/A] Classification: [1:Full Thickness Without Exposed Support Structures] [2:Full Thickness Without Exposed Support Structures] [N/A:N/A] Exudate Amount: [1:Large] [2:Large] [N/A:N/A] Exudate Type: [1:Serosanguineous] [2:Serous] [N/A:N/A] Exudate Color: [1:red, brown] [2:amber] [N/A:N/A] Wound Margin: [1:Distinct, outline attached] [2:Distinct, outline attached] [N/A:N/A] Granulation Amount: [1:Small (1-33%)] [2:Medium (34-66%)] [N/A:N/A] Granulation Quality: [1:Red] [2:Red] [N/A:N/A] Necrotic Amount: [1:Large (67-100%)] [2:Medium (34-66%)] [N/A:N/A] Exposed Structures: [1:Fat Layer (Subcutaneous  Tissue) Exposed: Yes Fascia: No Tendon: No Muscle: No Joint: No Bone: No] [2:Fat Layer (Subcutaneous Tissue) Exposed: Yes Fascia: No Tendon: No Muscle: No Joint: No Bone: No] [N/A:N/A] Epithelialization: [1:Small (1-33%)] [2:None] [N/A:N/A] Periwound Skin Texture: [1:Excoriation: No Induration: No Callus: No] [2:Excoriation: No Induration: No Callus: No] [N/A:N/A] Crepitus: No Crepitus: No Rash: No Rash: No Scarring: No Scarring: No Periwound Skin Moisture: Maceration: No Maceration: No N/A Dry/Scaly: No Dry/Scaly: No Periwound Skin Color: Atrophie Blanche: No Atrophie Blanche: No N/A Cyanosis: No Cyanosis: No Ecchymosis: No Ecchymosis: No Erythema: No Erythema: No Hemosiderin Staining: No Hemosiderin Staining: No Mottled: No Mottled: No Pallor: No Pallor: No Rubor: No Rubor: No Temperature: No Abnormality No Abnormality N/A Tenderness on Palpation: Yes Yes N/A Wound Preparation: Ulcer Cleansing: Ulcer Cleansing: N/A Rinsed/Irrigated with Saline Rinsed/Irrigated with Saline Topical Anesthetic Applied: Topical Anesthetic Applied: Other: lidocaine 4% Other: lidocaine 4% Treatment Notes Electronic Signature(s) Signed: 12/10/2017 3:21:08 PM By: Alejandro Mulling Entered By: Alejandro Mulling on 12/10/2017 08:26:56 [name] ([id number]) -------------------------------------------------------------------------------- Multi-Disciplinary Care Plan Details Patient Name: [name] Date of Service: 12/10/2017 8:00 AM Medical Record Number: [id number] Patient Account Number: [id number] Date of Birth/Sex: March 07, 1946 (72 y.o. M) Treating RN: [name] Primary Care Vang Kraeger: [name] Other Clinician: Referring Zainab Crumrine: [name] Treating Peja Allender/Extender: STONE III, HOYT Weeks in Treatment: 99 Active Inactive ` Venous Leg Ulcer Nursing Diagnoses: Knowledge deficit related to disease process and management Goals: Patient will maintain  optimal edema control Date Initiated: 04/03/2016 Target Resolution Date: 11/24/2016 Goal Status: Active Interventions: Compression as ordered Treatment Activities: Therapeutic compression applied : 04/03/2016 Notes: ` Wound/Skin Impairment Nursing Diagnoses: Impaired tissue integrity Goals: Ulcer/skin breakdown will heal within 14 weeks Date Initiated: 01/17/2016 Target Resolution Date: 11/24/2016 Goal Status: Active Interventions: Assess ulceration(s) every visit Notes: Electronic Signature(s) Signed: 12/10/2017 3:21:08 PM By: Alejandro Mulling Entered By: Alejandro Mulling on 12/10/2017 08:26:47 [name] ([id number]) -------------------------------------------------------------------------------- Pain Assessment Details Patient Name: [name] Date of Service: 12/10/2017 8:00 AM Medical Record Number: [id number] Patient Account Number: [id number] Date of Birth/Sex: 1946-02-27 (72 y.o. M) Treating RN: Renne Crigler Primary Care Renji Berwick: [name] Other Clinician: Referring Blayne Frankie: [name] Treating Navdeep Fessenden/Extender: STONE III, HOYT Weeks in Treatment: 99 Active Problems Location of Pain Severity and Description of Pain Patient Has Paino Yes Site Locations Duration of the Pain. Constant / Intermittento Intermittent Rate the pain. Current Pain Level: 2 Character of Pain Describe the Pain: Aching Pain Management and Medication Current Pain Management: Electronic Signature(s) Signed: 12/11/2017 4:34:44 PM By: Renne Crigler Entered By: Renne Crigler on 12/10/2017 08:15:15 [name] ([id number]) -------------------------------------------------------------------------------- Patient/Caregiver Education Details Patient Name: [name] Date of Service: 12/10/2017 8:00 AM Medical Record Number: [id number] Patient Account Number: [id number] Date of Birth/Gender: 09-Feb-1946 (72 y.o. M) Treating RN: [name] Primary  Care Physician: [name] Other Clinician: Referring Physician: [name] Treating Physician/Extender: Linwood Dibbles,  HOYT Weeks in Treatment: 7799 Education Assessment Education Provided To: Patient Education Topics Provided Wound/Skin Impairment: Handouts: Caring for Your Ulcer, Other: change dressing as ordered Methods: Demonstration, Explain/Verbal Responses: State content correctly Electronic Signature(s) Signed: 12/10/2017 3:21:08 PM By: Alejandro MullingPinkerton, Debra Entered By: Alejandro MullingPinkerton, Debra on 12/10/2017 08:43:07 [name], [name] ([id number]) -------------------------------------------------------------------------------- Wound Assessment Details Patient Name: [name], [name] Date of Service: 12/10/2017 8:00 AM Medical Record Number: [id number] Patient Account Number: [id number] Date of Birth/Sex: Jan 24, 1946 [age] y.o. M) Treating RN: Renne CriglerFlinchum, Cheryl Primary Care Michalina Calbert: Darreld McleanMILES, LINDA Other Clinician: Referring Maxwell Lemen: Darreld McleanMILES, LINDA Treating Deziah Renwick/Extender: STONE III, HOYT Weeks in Treatment: 99 Wound Status Wound Number: 1 Primary Venous Leg Ulcer Etiology: Wound Location: Right Malleolus - Lateral Wound Open Wounding Event: Gradually Appeared Status: Date Acquired: 08/11/2015 Comorbid Cataracts, Chronic Obstructive Pulmonary Weeks Of Treatment: 99 History: Disease (COPD), Osteoarthritis Clustered Wound: No Photos Photo Uploaded By: Renne CriglerFlinchum, Cheryl on 12/10/2017 08:27:22 Wound Measurements Length: (cm) 6 Width: (cm) 6.5 Depth: (cm) 0.2 Area: (cm) 30.631 Volume: (cm) 6.126 % Reduction in Area: -77.3% % Reduction in Volume: -18.2% Epithelialization: Small (1-33%) Tunneling: No Undermining: No Wound Description Full Thickness Without Exposed Support Classification: Structures Wound Margin: Distinct, outline attached Exudate Large Amount: Exudate Type: Serosanguineous Exudate Color: red, brown Foul Odor After Cleansing: No Slough/Fibrino  Yes Wound Bed Granulation Amount: Small (1-33%) Exposed Structure Granulation Quality: Red Fascia Exposed: No Necrotic Amount: Large (67-100%) Fat Layer (Subcutaneous Tissue) Exposed: Yes Necrotic Quality: Adherent Slough Tendon Exposed: No Muscle Exposed: No Joint Exposed: No Bone Exposed: No Pestka, Creed C. ([id number]) Periwound Skin Texture Texture Color No Abnormalities Noted: No No Abnormalities Noted: No Callus: No Atrophie Blanche: No Crepitus: No Cyanosis: No Excoriation: No Ecchymosis: No Induration: No Erythema: No Rash: No Hemosiderin Staining: No Scarring: No Mottled: No Pallor: No Moisture Rubor: No No Abnormalities Noted: No Dry / Scaly: No Temperature / Pain Maceration: No Temperature: No Abnormality Tenderness on Palpation: Yes Wound Preparation Ulcer Cleansing: Rinsed/Irrigated with Saline Topical Anesthetic Applied: Other: lidocaine 4%, Treatment Notes Wound #1 (Right, Lateral Malleolus) 1. Cleansed with: Clean wound with Normal Saline 2. Anesthetic Topical Lidocaine 4% cream to wound bed prior to debridement 4. Dressing Applied: Other dressing (specify in notes) 5. Secondary Dressing Applied ABD Pad Dry Gauze Kerlix/Conform 7. Secured with Tape Notes xtrasorn, silvercel, gentamycin cream Electronic Signature(s) Signed: 12/11/2017 4:34:44 PM By: Renne CriglerFlinchum, Cheryl Entered By: Renne CriglerFlinchum, Cheryl on 12/10/2017 08:20:02 [name], [name] ([id number]) -------------------------------------------------------------------------------- Wound Assessment Details Patient Name: [name], [name] Date of Service: 12/10/2017 8:00 AM Medical Record Number: [id number] Patient Account Number: [id number] Date of Birth/Sex: Jan 24, 1946 [age] y.o. M) Treating RN: Renne CriglerFlinchum, Cheryl Primary Care Kamori Kitchens: Darreld McleanMILES, LINDA Other Clinician: Referring Aileene Lanum: Darreld McleanMILES, LINDA Treating Angelina Venard/Extender: STONE III, HOYT Weeks in Treatment: 99 Wound Status Wound  Number: 2 Primary Venous Leg Ulcer Etiology: Wound Location: Right Malleolus - Medial Wound Open Wounding Event: Gradually Appeared Status: Date Acquired: 01/30/2016 Comorbid Cataracts, Chronic Obstructive Pulmonary Weeks Of Treatment: 97 History: Disease (COPD), Osteoarthritis Clustered Wound: Yes Photos Photo Uploaded By: Renne CriglerFlinchum, Cheryl on 12/10/2017 08:27:23 Wound Measurements Length: (cm) 4 Width: (cm) 4 Depth: (cm) 0.1 Area: (cm) 12.566 Volume: (cm) 1.257 % Reduction in Area: -8.8% % Reduction in Volume: -8.8% Epithelialization: None Tunneling: No Undermining: No Wound Description Full Thickness Without Exposed Support Classification: Structures Wound Margin: Distinct, outline attached Exudate Large Amount: Exudate Type: Serous Exudate Color: amber Foul Odor After Cleansing: No Slough/Fibrino Yes Wound Bed Granulation Amount: Medium (34-66%) Exposed Structure Granulation Quality: Red Fascia Exposed:  No Necrotic Amount: Medium (34-66%) Fat Layer (Subcutaneous Tissue) Exposed: Yes Necrotic Quality: Adherent Slough Tendon Exposed: No Muscle Exposed: No Joint Exposed: No Bone Exposed: No Hinz, Shepard C. ([id number]) Periwound Skin Texture Texture Color No Abnormalities Noted: No No Abnormalities Noted: No Callus: No Atrophie Blanche: No Crepitus: No Cyanosis: No Excoriation: No Ecchymosis: No Induration: No Erythema: No Rash: No Hemosiderin Staining: No Scarring: No Mottled: No Pallor: No Moisture Rubor: No No Abnormalities Noted: No Dry / Scaly: No Temperature / Pain Maceration: No Temperature: No Abnormality Tenderness on Palpation: Yes Wound Preparation Ulcer Cleansing: Rinsed/Irrigated with Saline Topical Anesthetic Applied: Other: lidocaine 4%, Treatment Notes Wound #2 (Right, Medial Malleolus) 1. Cleansed with: Clean wound with Normal Saline 2. Anesthetic Topical Lidocaine 4% cream to wound bed prior to debridement 4.  Dressing Applied: Other dressing (specify in notes) 5. Secondary Dressing Applied ABD Pad Dry Gauze Kerlix/Conform 7. Secured with Tape Notes xtrasorn, silvercel, gentamycin cream Electronic Signature(s) Signed: 12/11/2017 4:34:44 PM By: Renne Crigler Entered By: Renne Crigler on 12/10/2017 08:20:24 [name] ([id number]) -------------------------------------------------------------------------------- Vitals Details Patient Name: [name] Date of Service: 12/10/2017 8:00 AM Medical Record Number: [id number] Patient Account Number: [id number] Date of Birth/Sex: 14-Jul-1946 (71 y.o. M) Treating RN: Renne Crigler Primary Care Mckell Riecke: [name] Other Clinician: Referring Lagena Strand: [name] Treating Harvest Deist/Extender: STONE III, HOYT Weeks in Treatment: 99 Vital Signs Time Taken: 08:15 Temperature (F): 98.4 Height (in): 76 Pulse (bpm): 72 Weight (lbs): 238 Respiratory Rate (breaths/min): 18 Body Mass Index (BMI): 29 Blood Pressure (mmHg): 125/59 Reference Range: 80 - 120 mg / dl Electronic Signature(s) Signed: 12/11/2017 4:34:44 PM By: Renne Crigler Entered By: Renne Crigler on 12/10/2017 08:16:01

## 2017-12-17 ENCOUNTER — Encounter: Payer: Medicare HMO | Admitting: Physician Assistant

## 2017-12-17 DIAGNOSIS — I87331 Chronic venous hypertension (idiopathic) with ulcer and inflammation of right lower extremity: Secondary | ICD-10-CM | POA: Diagnosis not present

## 2017-12-23 NOTE — Progress Notes (Signed)
[name], [name] ([id number]) Visit Report for 12/17/2017 Arrival Information Details Patient Name: [name], [name]. Date of Service: 12/17/2017 8:00 AM Medical Record Number: [id number] Patient Account Number: [id number] Date of Birth/Sex: 1945/12/11 (72 y.o. M) Treating RN: Renne Crigler Primary Care Mckell Riecke: [name] Other Clinician: Referring Jaevon Paras: [name] Treating Hermila Millis/Extender: [name], [name] Weeks in Treatment: 100 Visit Information History Since Last Visit All ordered tests and consults were completed: No Patient Arrived: Ambulatory Added or deleted any medications: No Arrival Time: 08:05 Any new allergies or adverse reactions: No Accompanied By: sef Had a fall or experienced change in No Transfer Assistance: None activities of daily living that may affect Patient Identification Verified: Yes risk of falls: Secondary Verification Process Yes Signs or symptoms of abuse/neglect since last visito No Completed: Hospitalized since last visit: No Patient Requires Transmission-Based No Implantable device outside of the clinic excluding No Precautions: cellular tissue based products placed in the center Patient Has Alerts: Yes since last visit: Patient Alerts: Patient on Blood Pain Present Now: Yes Thinner Electronic Signature(s) Signed: 12/17/2017 4:51:25 PM By: Renne Crigler Entered By: Renne Crigler on 12/17/2017 08:06:47 [name] ([id number]) -------------------------------------------------------------------------------- Clinic Level of Care Assessment Details Patient Name: [name] Date of Service: 12/17/2017 8:00 AM Medical Record Number: [id number] Patient Account Number: [id number] Date of Birth/Sex: 09/18/1945 (72 y.o. M) Treating RN: Renne Crigler Primary Care Mariame Rybolt: [name] Other Clinician: Referring Dee Maday: [name] Treating Aram Domzalski/Extender: [name], [name] Weeks in Treatment:  100 Clinic Level of Care Assessment Items TOOL 4 Quantity Score X - Use when only an EandM is performed on FOLLOW-UP visit 1 0 ASSESSMENTS - Nursing Assessment / Reassessment X - Reassessment of Co-morbidities (includes updates in patient status) 1 10 X- 1 5 Reassessment of Adherence to Treatment Plan ASSESSMENTS - Wound and Skin Assessment / Reassessment  - Simple Wound Assessment / Reassessment - one wound 0 X- 2 5 Complex Wound Assessment / Reassessment - multiple wounds  - 0 Dermatologic / Skin Assessment (not related to wound area) ASSESSMENTS - Focused Assessment  - Circumferential Edema Measurements - multi extremities 0  - 0 Nutritional Assessment / Counseling / Intervention  - 0 Lower Extremity Assessment (monofilament, tuning fork, pulses)  - 0 Peripheral Arterial Disease Assessment (using hand held doppler) ASSESSMENTS - Ostomy and/or Continence Assessment and Care  - Incontinence Assessment and Management 0  - 0 Ostomy Care Assessment and Management (repouching, etc.) PROCESS - Coordination of Care  - Simple Patient / Family Education for ongoing care 0 X- 1 20 Complex (extensive) Patient / Family Education for ongoing care  - 0 Staff obtains Chiropractor, Records, Test Results / Process Orders  - 0 Staff telephones HHA, Nursing Homes / Clarify orders / etc  - 0 Routine Transfer to another Facility (non-emergent condition)  - 0 Routine Hospital Admission (non-emergent condition)  - 0 New Admissions / Manufacturing engineer / Ordering NPWT, Apligraf, etc.  - 0 Emergency Hospital Admission (emergent condition)  - 0 Simple Discharge Coordination [name], [name]. ([id number]) X- 1 15 Complex (extensive) Discharge Coordination PROCESS - Special Needs  - Pediatric / Minor Patient Management 0  - 0 Isolation Patient Management  - 0 Hearing / Language / Visual special needs  - 0 Assessment of Community assistance  (transportation, D/C planning, etc.)  - 0 Additional assistance / Altered mentation  - 0 Support Surface(s) Assessment (bed, cushion, seat, etc.) INTERVENTIONS - Wound Cleansing / Measurement  - Simple Wound Cleansing - one wound 0  X- 2 5 Complex Wound Cleansing - multiple wounds X- 1 5 Wound Imaging (photographs - any number of wounds) []  - 0 Wound Tracing (instead of photographs) []  - 0 Simple Wound Measurement - one wound X- 2 5 Complex Wound Measurement - multiple wounds INTERVENTIONS - Wound Dressings []  - Small Wound Dressing one or multiple wounds 0 X- 2 15 Medium Wound Dressing one or multiple wounds []  - 0 Large Wound Dressing one or multiple wounds []  - 0 Application of Medications - topical []  - 0 Application of Medications - injection INTERVENTIONS - Miscellaneous []  - External ear exam 0 []  - 0 Specimen Collection (cultures, biopsies, blood, body fluids, etc.) []  - 0 Specimen(s) / Culture(s) sent or taken to Lab for analysis []  - 0 Patient Transfer (multiple staff / Nurse, adult / Similar devices) []  - 0 Simple Staple / Suture removal (25 or less) []  - 0 Complex Staple / Suture removal (26 or more) []  - 0 Hypo / Hyperglycemic Management (close monitor of Blood Glucose) []  - 0 Ankle / Brachial Index (ABI) - do not check if billed separately X- 1 5 Vital Signs [name], [name] Has the patient been seen at the hospital within the last three years: Yes Total Score: 120 Level Of Care: New/Established - Level 4 Electronic Signature(s) Signed: 12/17/2017 4:51:25 PM By: Renne Crigler Entered By: Renne Crigler on 12/17/2017 08:42:11 [name] ([id number]) -------------------------------------------------------------------------------- Encounter Discharge Information Details Patient Name: [name] Date of Service: 12/17/2017 8:00 AM Medical Record Number: [id number] Patient Account Number: [id number] Date of  Birth/Sex: 1945/11/18 (72 y.o. M) Treating RN: [name] Primary Care Mykell Rawl: [name] Other Clinician: Referring Travor Royce: [name] Treating Tamecca Artiga/Extender: [name], [name] Weeks in Treatment: 100 Encounter Discharge Information Items Discharge Pain Level: 8 Discharge Condition: Stable Ambulatory Status: Ambulatory Discharge Destination: Home Transportation: Private Auto Schedule Follow-up Appointment: Yes Medication Reconciliation completed and No provided to Patient/Care Derrik Mceachern: Patient Clinical Summary of Care: Declined Electronic Signature(s) Signed: 12/17/2017 4:51:25 PM By: Renne Crigler Entered By: Renne Crigler on 12/17/2017 08:43:40 [name] ([id number]) -------------------------------------------------------------------------------- Lower Extremity Assessment Details Patient Name: [name] Date of Service: 12/17/2017 8:00 AM Medical Record Number: [id number] Patient Account Number: [id number] Date of Birth/Sex: 1946-06-12 (72 y.o. M) Treating RN: Renne Crigler Primary Care Laqueta Bonaventura: [name] Other Clinician: Referring Avantae Bither: [name] Treating Cris Talavera/Extender: [name], [name] Weeks in Treatment: 100 Edema Assessment Assessed: [Left: No] [Right: No] Edema: [Left: N] [Right: o] Vascular Assessment Claudication: Claudication Assessment [Right:None] Pulses: Dorsalis Pedis Palpable: [Right:Yes] Posterior Tibial Extremity colors, hair growth, and conditions: Extremity Color: [Right:Red] Hair Growth on Extremity: [Right:No] Temperature of Extremity: [Right:Warm] Capillary Refill: [Right:< 3 seconds] Toe Nail Assessment Left: Right: Thick: Yes Discolored: Yes Deformed: Yes Improper Length and Hygiene: Yes Electronic Signature(s) Signed: 12/17/2017 4:51:25 PM By: Renne Crigler Entered By: Renne Crigler on 12/17/2017 08:19:53 [name]  ([id number]) -------------------------------------------------------------------------------- Multi Wound Chart Details Patient Name: [name] Date of Service: 12/17/2017 8:00 AM Medical Record Number: [id number] Patient Account Number: [id number] Date of Birth/Sex: Mar 07, 1946 (72 y.o. M) Treating RN: Renne Crigler Primary Care Kimarion Chery: [name] Other Clinician: Referring Emmry Hinsch: [name] Treating Remberto Lienhard/Extender: [name], [name] Weeks in Treatment: 100 Vital Signs Height(in): 76 Pulse(bpm): 64 Weight(lbs): 238 Blood Pressure(mmHg): 136/66 Body Mass Index(BMI): 29 Temperature(F): 98.1 Respiratory Rate 18 (breaths/min): Photos: [1:No Photos] [2:No Photos] [4:No Photos] Wound Location: [1:Right Malleolus - Lateral] [2:Right Malleolus - Medial] [4:Right Lower Leg - Midline] Wounding Event: [1:Gradually  Appeared] [2:Gradually Appeared] [4:Gradually Appeared] Primary Etiology: [1:Venous Leg Ulcer] [2:Venous Leg Ulcer] [4:Venous Leg Ulcer] Comorbid History: [1:Cataracts, Chronic Obstructive Cataracts, Chronic Obstructive Cataracts, Chronic Obstructive Pulmonary Disease (COPD), Pulmonary Disease (COPD), Pulmonary Disease (COPD), Osteoarthritis] [2:Osteoarthritis] [4:Osteoarthritis] Date Acquired: [1:08/11/2015] [2:01/30/2016] [4:12/17/2017] Weeks of Treatment: [1:100] [2:98] [4:0] Wound Status: [1:Open] [2:Open] [4:Open] Clustered Wound: [1:No] [2:Yes] [4:Yes] Clustered Quantity: [1:N/A] [2:N/A] [4:2] Measurements L x W x D [1:6.5x6.5x0.2] [2:4x3.6x0.1] [4:1x1.5x0.1] (cm) Area (cm) : [1:33.183] [2:11.31] [4:1.178] Volume (cm) : [1:6.637] [2:1.131] [4:0.118] % Reduction in Area: [1:-92.00%] [2:2.00%] [4:N/A] % Reduction in Volume: [1:-28.00%] [2:2.10%] [4:N/A] Classification: [1:Full Thickness Without Exposed Support Structures] [2:Full Thickness Without Exposed Support Structures] [4:Partial Thickness] Exudate Amount: [1:Large] [2:Large] [4:Large] Exudate  Type: [1:Purulent] [2:Purulent] [4:Purulent] Exudate Color: [1:yellow, brown, green] [2:yellow, brown, green] [4:yellow, brown, green] Wound Margin: [1:Distinct, outline attached] [2:Distinct, outline attached] [4:Flat and Intact] Granulation Amount: [1:Small (1-33%)] [2:Medium (34-66%)] [4:Large (67-100%)] Granulation Quality: [1:Red] [2:Red] [4:Red] Necrotic Amount: [1:Large (67-100%)] [2:Medium (34-66%)] [4:None Present (0%)] Exposed Structures: [1:Fat Layer (Subcutaneous Tissue) Exposed: Yes Fascia: No Tendon: No Muscle: No Joint: No Bone: No] [2:Fat Layer (Subcutaneous Tissue) Exposed: Yes Fascia: No Tendon: No Muscle: No Joint: No Bone: No] [4:N/A] Epithelialization: [1:Small (1-33%)] [2:None] [4:None] Periwound Skin Texture: [1:Excoriation: Yes Induration: No] [2:Excoriation: No Induration: No] [4:Excoriation: Yes Induration: No] Callus: No Callus: No Callus: No Crepitus: No Crepitus: No Crepitus: No Rash: No Rash: No Rash: No Scarring: No Scarring: No Scarring: No Periwound Skin Moisture: Maceration: Yes Maceration: No Maceration: Yes Dry/Scaly: No Dry/Scaly: No Dry/Scaly: No Periwound Skin Color: Erythema: Yes Erythema: Yes Erythema: Yes Atrophie Blanche: No Atrophie Blanche: No Atrophie Blanche: No Cyanosis: No Cyanosis: No Cyanosis: No Ecchymosis: No Ecchymosis: No Ecchymosis: No Hemosiderin Staining: No Hemosiderin Staining: No Hemosiderin Staining: No Mottled: No Mottled: No Mottled: No Pallor: No Pallor: No Pallor: No Rubor: No Rubor: No Rubor: No Erythema Location: Circumferential Circumferential Circumferential Temperature: No Abnormality No Abnormality No Abnormality Tenderness on Palpation: Yes Yes Yes Wound Preparation: Ulcer Cleansing: Ulcer Cleansing: Ulcer Cleansing: Rinsed/Irrigated with Saline Rinsed/Irrigated with Saline Rinsed/Irrigated with Saline Topical Anesthetic Applied: Topical Anesthetic Applied: Topical Anesthetic  Applied: Other: lidocaine 4% Other: lidocaine 4% Other: lidocaine 4% Treatment Notes Electronic Signature(s) Signed: 12/17/2017 4:51:25 PM By: Renne Crigler Entered By: Renne Crigler on 12/17/2017 08:29:01 [name] ([id number]) -------------------------------------------------------------------------------- Multi-Disciplinary Care Plan Details Patient Name: [name] Date of Service: 12/17/2017 8:00 AM Medical Record Number: [id number] Patient Account Number: [id number] Date of Birth/Sex: 02/05/1946 (72 y.o. M) Treating RN: Renne Crigler Primary Care Jerra Huckeby: [name] Other Clinician: Referring Kaavya Puskarich: [name] Treating Francoise Chojnowski/Extender: [name], [name] Weeks in Treatment: 100 Active Inactive ` Venous Leg Ulcer Nursing Diagnoses: Knowledge deficit related to disease process and management Goals: Patient will maintain optimal edema control Date Initiated: 04/03/2016 Target Resolution Date: 11/24/2016 Goal Status: Active Interventions: Compression as ordered Treatment Activities: Therapeutic compression applied : 04/03/2016 Notes: ` Wound/Skin Impairment Nursing Diagnoses: Impaired tissue integrity Goals: Ulcer/skin breakdown will heal within 14 weeks Date Initiated: 01/17/2016 Target Resolution Date: 11/24/2016 Goal Status: Active Interventions: Assess ulceration(s) every visit Notes: Electronic Signature(s) Signed: 12/17/2017 4:51:25 PM By: Renne Crigler Entered By: Renne Crigler on 12/17/2017 08:28:50 [name] ([id number]) -------------------------------------------------------------------------------- Pain Assessment Details Patient Name: [name] Date of Service: 12/17/2017 8:00 AM Medical Record Number: [id number] Patient Account Number: [id number] Date of Birth/Sex: June 12, 1946 (72 y.o. M) Treating RN: Renne Crigler Primary Care Karlin Heilman: [name] Other Clinician: Referring Cory Rama: [name] Treating Nguyet Mercer/Extender: [name],  [name] Weeks in Treatment: 100 Active Problems Location of Pain Severity and Description of Pain Patient Has Paino Yes Site Locations Pain Location: Pain in Ulcers Duration of the Pain. Constant / Intermittento Constant Rate the pain. Current Pain Level: 10 Character of Pain Describe the Pain: Aching, Burning Pain Management and Medication Current Pain Management: Electronic Signature(s) Signed: 12/17/2017 4:51:25 PM By: Renne Crigler Entered By: Renne Crigler on 12/17/2017 08:07:04 [name] ([id number]) -------------------------------------------------------------------------------- Patient/Caregiver Education Details Patient Name: [name] Date of Service: 12/17/2017 8:00 AM Medical Record Number: [id number] Patient Account Number: [id number] Date of Birth/Gender: 08-13-1946 (72 y.o. M) Treating RN: Renne Crigler Primary Care Physician: [name] Other Clinician: Referring Physician: [name] Treating Physician/Extender: [name], [name] Weeks in Treatment: 100 Education Assessment Education Provided To: Patient Education Topics Provided Wound/Skin Impairment: Handouts: Caring for Your Ulcer Methods: Explain/Verbal Responses: State content correctly Electronic Signature(s) Signed: 12/17/2017 4:51:25 PM By: Renne Crigler Entered By: Renne Crigler on 12/17/2017 08:43:56 [name], [name] ([id number]) -------------------------------------------------------------------------------- Wound Assessment Details Patient Name: [name] Date of Service: 12/17/2017 8:00 AM Medical Record Number: [id number] Patient Account Number: [id number] Date of Birth/Sex: [birth year] (72 y.o. M) Treating RN: Renne Crigler Primary Care Raizy Auzenne: [name] Other Clinician: Referring Ishika Chesterfield: [name] Treating Aleea Hendry/Extender: [name], [name] Weeks in Treatment: 100 Wound Status Wound  Number: 1 Primary Venous Leg Ulcer Etiology: Wound Location: Right Malleolus - Lateral Wound Open Wounding Event: Gradually Appeared Status: Date Acquired: 08/11/2015 Comorbid Cataracts, Chronic Obstructive Pulmonary Weeks Of Treatment: 100 History: Disease (COPD), Osteoarthritis Clustered Wound: No Photos Wound Measurements Length: (cm) 6.5 Width: (cm) 6.5 Depth: (cm) 0.2 Area: (cm) 33.183 Volume: (cm) 6.637 % Reduction in Area: -92% % Reduction in Volume: -28% Epithelialization: Small (1-33%) Tunneling: No Undermining: No Wound Description Full Thickness Without Exposed Support Foul Odo Classification: Structures Slough/F Wound Margin: Distinct, outline attached Exudate Large Amount: Exudate Type: Purulent Exudate Color: yellow, brown, green r After Cleansing: No ibrino Yes Wound Bed Granulation Amount: Small (1-33%) Exposed Structure Granulation Quality: Red Fascia Exposed: No Necrotic Amount: Large (67-100%) Fat Layer (Subcutaneous Tissue) Exposed: Yes Necrotic Quality: Adherent Slough Tendon Exposed: No Muscle Exposed: No Joint Exposed: No Bone Exposed: No Periwound Skin Texture [name], [name] ([id number]) Texture Color No Abnormalities Noted: No No Abnormalities Noted: No Callus: No Atrophie Blanche: No Crepitus: No Cyanosis: No Excoriation: Yes Ecchymosis: No Induration: No Erythema: Yes Rash: No Erythema Location: Circumferential Scarring: No Hemosiderin Staining: No Mottled: No Moisture Pallor: No No Abnormalities Noted: No Rubor: No Dry / Scaly: No Maceration: Yes Temperature / Pain Temperature: No Abnormality Tenderness on Palpation: Yes Wound Preparation Ulcer Cleansing: Rinsed/Irrigated with Saline Topical Anesthetic Applied: Other: lidocaine 4%, Treatment Notes Wound #1 (Right, Lateral Malleolus) 1. Cleansed with: Clean wound with Normal Saline 2. Anesthetic Topical Lidocaine 4% cream to wound bed prior to  debridement 3. Peri-wound Care: Barrier cream Other peri-wound care (specify in notes) 4. Dressing Applied: Other dressing (specify in notes) Notes gentamycin, silvercel, xtrasorb, abd, kerrlix wrap and darko Astronomer Signature(s) Signed: 12/19/2017 3:28:21 PM By: Renne Crigler Previous Signature: 12/17/2017 4:51:25 PM Version By: Renne Crigler Entered By: Renne Crigler on 12/18/2017 16:45:10 [name] ([id number]) -------------------------------------------------------------------------------- Wound Assessment Details Patient Name: [name] Date of Service: 12/17/2017 8:00 AM Medical Record Number: [id number] Patient Account Number: [id number] Date of Birth/Sex: May 18, 1946 (72 y.o. M) Treating RN: Renne Crigler Primary Care Venezia Sargeant: [name] Other Clinician: Referring Malissie Musgrave: [name] Treating Louvenia Golomb/Extender: [name], [name] Weeks in  Treatment: 100 Wound Status Wound Number: 2 Primary Venous Leg Ulcer Etiology: Wound Location: Right Malleolus - Medial Wound Open Wounding Event: Gradually Appeared Status: Date Acquired: 01/30/2016 Comorbid Cataracts, Chronic Obstructive Pulmonary Weeks Of Treatment: 98 History: Disease (COPD), Osteoarthritis Clustered Wound: Yes Photos Wound Measurements Length: (cm) 4 Width: (cm) 3.6 Depth: (cm) 0.1 Area: (cm) 11.31 Volume: (cm) 1.131 % Reduction in Area: 2% % Reduction in Volume: 2.1% Epithelialization: None Tunneling: No Undermining: No Wound Description Full Thickness Without Exposed Support Foul Odo Classification: Structures Slough/F Wound Margin: Distinct, outline attached Exudate Large Amount: Exudate Type: Purulent Exudate Color: yellow, brown, green r After Cleansing: No ibrino Yes Wound Bed Granulation Amount: Medium (34-66%) Exposed Structure Granulation Quality: Red Fascia Exposed: No Necrotic Amount: Medium (34-66%) Fat Layer (Subcutaneous Tissue)  Exposed: Yes Necrotic Quality: Adherent Slough Tendon Exposed: No Muscle Exposed: No Joint Exposed: No Bone Exposed: No Periwound Skin Texture [name], [name] ([id number]) Texture Color No Abnormalities Noted: No No Abnormalities Noted: No Callus: No Atrophie Blanche: No Crepitus: No Cyanosis: No Excoriation: No Ecchymosis: No Induration: No Erythema: Yes Rash: No Erythema Location: Circumferential Scarring: No Hemosiderin Staining: No Mottled: No Moisture Pallor: No No Abnormalities Noted: No Rubor: No Dry / Scaly: No Maceration: No Temperature / Pain Temperature: No Abnormality Tenderness on Palpation: Yes Wound Preparation Ulcer Cleansing: Rinsed/Irrigated with Saline Topical Anesthetic Applied: Other: lidocaine 4%, Treatment Notes Wound #2 (Right, Medial Malleolus) 1. Cleansed with: Clean wound with Normal Saline 2. Anesthetic Topical Lidocaine 4% cream to wound bed prior to debridement 3. Peri-wound Care: Barrier cream Other peri-wound care (specify in notes) 4. Dressing Applied: Other dressing (specify in notes) Notes gentamycin, silvercel, xtrasorb, abd, kerrlix wrap and darko Astronomer Signature(s) Signed: 12/19/2017 3:28:21 PM By: Renne Crigler Previous Signature: 12/17/2017 4:51:25 PM Version By: Renne Crigler Entered By: Renne Crigler on 12/18/2017 16:46:36 [name], [name] ([id number]) -------------------------------------------------------------------------------- Wound Assessment Details Patient Name: [name] Date of Service: 12/17/2017 8:00 AM Medical Record Number: [id number] Patient Account Number: [id number] Date of Birth/Sex: 10-05-1945 (72 y.o. M) Treating RN: Renne Crigler Primary Care Eulanda Dorion: [name] Other Clinician: Referring Angeleen Horney: [name] Treating Mychal Decarlo/Extender: [name], [name] Weeks in Treatment: 100 Wound Status Wound Number: 4 Primary Venous Leg Ulcer Etiology: Wound  Location: Right Lower Leg - Midline Wound Open Wounding Event: Gradually Appeared Status: Date Acquired: 12/17/2017 Comorbid Cataracts, Chronic Obstructive Pulmonary Weeks Of Treatment: 0 History: Disease (COPD), Osteoarthritis Clustered Wound: Yes Photos Wound Measurements Length: (cm) 1 % Reduc Width: (cm) 1.5 % Reduc Depth: (cm) 0.1 Epithel Clustered Quantity: 2 Tunneli Area: (cm) 1.178 Underm Volume: (cm) 0.118 tion in Area: 0% tion in Volume: 0% ialization: None ng: No ining: No Wound Description Classification: Partial Thickness Wound Margin: Flat and Intact Exudate Amount: Large Exudate Type: Purulent Exudate Color: yellow, brown, green Foul Odor After Cleansing: No Slough/Fibrino No Wound Bed Granulation Amount: Large (67-100%) Granulation Quality: Red Necrotic Amount: None Present (0%) Periwound Skin Texture Texture Color No Abnormalities Noted: No No Abnormalities Noted: No Callus: No Atrophie Blanche: No Crepitus: No Cyanosis: No [name], [name] ([id number]) Excoriation: Yes Ecchymosis: No Induration: No Erythema: Yes Rash: No Erythema Location: Circumferential Scarring: No Hemosiderin Staining: No Mottled: No Moisture Pallor: No No Abnormalities Noted: No Rubor: No Dry / Scaly: No Maceration: Yes Temperature / Pain Temperature: No Abnormality Tenderness on Palpation: Yes Wound Preparation Ulcer Cleansing: Rinsed/Irrigated with Saline Topical Anesthetic Applied: Other: lidocaine 4%, Treatment Notes Wound #4 (Right, Midline Lower Leg) 1. Cleansed with: Clean  wound with Normal Saline 2. Anesthetic Topical Lidocaine 4% cream to wound bed prior to debridement 3. Peri-wound Care: Barrier cream Other peri-wound care (specify in notes) 4. Dressing Applied: Other dressing (specify in notes) Notes gentamycin, silvercel, xtrasorb, abd, kerrlix wrap and darko Astronomer Signature(s) Signed: 12/19/2017 3:28:21 PM By: Renne Crigler Previous Signature: 12/17/2017 4:51:25 PM Version By: Renne Crigler Entered By: Renne Crigler on 12/18/2017 16:47:50 [name] ([id number]) -------------------------------------------------------------------------------- Vitals Details Patient Name: [name] Date of Service: 12/17/2017 8:00 AM Medical Record Number: [id number] Patient Account Number: [id number] Date of Birth/Sex: Jan 10, 1946 (72 y.o. M) Treating RN: Renne Crigler Primary Care Ladeana Laplant: [name] Other Clinician: Referring Sohana Austell: [name] Treating Daylin Gruszka/Extender: [name], [name] Weeks in Treatment: 100 Vital Signs Time Taken: 08:07 Temperature (F): 98.1 Height (in): 76 Pulse (bpm): 64 Weight (lbs): 238 Respiratory Rate (breaths/min): 18 Body Mass Index (BMI): 29 Blood Pressure (mmHg): 136/66 Reference Range: 80 - 120 mg / dl Electronic Signature(s) Signed: 12/17/2017 4:51:25 PM By: Renne Crigler Entered By: Renne Crigler on 12/17/2017 08:13:00

## 2017-12-24 ENCOUNTER — Encounter: Payer: Medicare HMO | Admitting: Physician Assistant

## 2017-12-24 NOTE — Progress Notes (Signed)
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 his course of cefdinir. 08/28/16; patient arrives today again with copious amounts of thick really green drainage for our intake nurse. He states he has a very tender spot at the superior part of the lateral wound. Wounds are larger 09/04/16; no real change in the condition of this patient's wound still copious amounts of surface slough. Started him on  Iodoflex last week he is completing another course of Cefdinir or which I think was done empirically. His arterial study showed ABIs were 1.1 on the right 1.5 on the left. He did have a slightly reduced ABI in the right the left one was not obtained. Had calcification of the right posterior tibial artery. The interpretation was no segmental stenosis. His waveforms were triphasic. [name], [name] ([id number]) His reflux studies are later this month. Depending on this I'll send him for a vascular consultation, he may need to see plastic surgery as I believe he is had plastic surgery on this foot in the past. He had an injury to the foot in the 1980s. 1/16 /18 right lateral greater than right medial ankle wounds on the right in the setting of previous skin grafting. 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 He does not have arterial issues. Each week he comes in with the same adherent surface slough although there was less of this today 09/18/16; right lateral greater than right medial lower extremity wounds in the setting of previous skin grafting and trauma. He has least to vein laser ablation scheduled for February 2 for venous reflux. He does not have significant arterial disease. Problem has been very difficult to handle surface slough/necrotic tissue. Recently using Iodoflex for this with some, albeit slow improvement 09/25/16; right lateral greater than right medial lower extremity venous wounds in the setting of previous skin grafting. He is going for ablation surgery on February 2 after this he'll come back here for rewrap. He has been using Iodoflex as the primary dressing. 10/02/16; right lateral greater than right medial lower extremity wounds in the setting of previous skin grafting. He had his ablation surgery last week, I don't have a report. He tolerated this well. Came in with a thigh-high Unna boots on  Friday. We have been using Iodoflex as the primary dressing. His measurements are improving 10/09/16; continues to make nice aggressive in terms of the wounds on his lateral and medial right ankle in the setting of previous skin grafting. Yesterday he noticed drainage at one of his surgical sites from his venous ablation on the right calf. He took off the bandage over this area felt a "popping" sensation and a reddish-brown drainage. He is not complaining of any pain 10/16/16; he continues to make nice progression in terms of the wounds on the lateral and medial malleolus. Both smaller using Iodoflex. He had a surgical area in his posterior mid calf we have been using iodoform. All the wounds are down and dimensions 10/23/16; the patient arrives today with no complaints. He states the Iodoflex is a bit  uncomfortable. He is not systemically unwell. We have been using Iodoflex to the lateral right ankle and the medial and Aquacel Ag to the reflux surgical wound on the posterior right calf. All of these wounds are doing well 10/30/16; patient states he has no pain no systemic symptoms. I changed him to [facility] last week. Although the wounds are doing well 11/06/16; patient reports no pain or systemic symptoms. We continue with Hydrofera Blue. Both wound areas on the medial and lateral ankle appear to be doing well with improvement and dimensions and improvement in the wound bed. 11/13/16; patient's dimensions continued to improve. We continue with Hydrofera Blue on the medial and lateral side. Appear to be doing well with healthy granulation and advancing epithelialization 11/20/16; patient's dimensions improving laterally by about half a centimeter in length. Otherwise no change on the medial side. Using [facility] 12/04/16; no major change in patient's wound dimensions. Intake nurse reports more drainage. 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 He is voicing no complaints or concerns. He has been tolerating his twice weekly compression therapy changes 12/11/16 Patient complains of pain and increased drainage.. wants hydrofera blue 12/18/16 improvement. Sorbact 12/25/16; medial wound is smaller, lateral measures the same. Still on sorbact 01/01/17; medial wound continues to be smaller, lateral measures about the same however there is clearly advancing epithelialization here as well in fact I think the wound will ultimately divided into 2 open areas 01/08/17; unfortunately today fairly significant regression in several areas. Surface of the lateral wound covered again in adherent necrotic material which is difficult to debridement. He has significant surrounding skin maceration. The expanding area of tissue epithelialization in the middle of the wound that was encouraging last week appears to be smaller. There is no surrounding tenderness. The area on the medial leg also did not seem to be as healthy as last week, the reason for this regression this week is not totally clear. We have been using Sorbact for the last 4 [name]. We'll switched of polymen AG which we will order via home medical supply. If there is a problem with this would switch back to Iodoflex 01/15/18; drainage,odor. No change. Switched to polymen last week 01/22/17; still continuous drainage. Culture I did last week showed a few Proteus pansensitive. I did this culture because of drainage. Put him on Augmentin which she has been taking since Saturday however he is developed 4-5 liquid bowel movements. He is also on Coumadin. Beyond this wound is not changed at all, still nonviable necrotic surface material which I debrided reveals healthy granulation line 01/29/17; still copious amounts of drainage reported by her intake nurse. Wound measuring slightly smaller. Currently fact on Iodoflex although I'm looking forward  to changing back to perhaps [facility] or polymen AG 02/05/17; still large amounts of drainage and presenting with really large amounts of adherent slough and necrotic material over the remaining open area of the wound. We have been using Iodoflex but with little improvement in the surface. Change to [facility] 02/12/17; still large amount of drainage. Much less adherent slough however. Started [employer]	[place] last week [name], [name] ([id number]) 02/19/17; drainage is better this week. Much less adherent slough. Perhaps some improvement in dimensions. Using [facility] 02/26/17; severe venous insufficiency wounds on the right lateral and right medial leg. Drainage is some better and slough is less adherent we've been using Hydrofera Blue 03/05/17  on evaluation today patient appears to be doing well. His wounds have been decreasing in size and overall he is pleased with how this is progressing. We are awaiting approval for the epigraph which has previously been recommended in the meantime the [name] Dressing is to be doing very well for him. 03/12/17; wound dimensions are smaller still using Hydrofera Blue. Comes in on Fridays for a dressing change. 03/19/17; wound dimensions continued to contract. Healing of this wound is complicated by continuous significant drainage as well as recurrent buildup of necrotic surface material. We looked into Apligraf and he has a $290 co-pay per application but truthfully I think the drainage as well as the nonviable surface would preclude use of Apligraf there are any other skin substitute at this point therefore the continued plan will be debridement each clinic visit, 2 times a week dressing changes and continued use of Hydrofera Blue. improvement has been very slow but sustained 03/26/17 perhaps slight improvements in peripheral epithelialization is especially inferiorly. Still with large amount of drainage and tightly adherent necrotic surface  on arrival. Along with the intake nurse I reviewed previous treatment. He worsened on Iodoflex and had a 4 week trial of sorbact, Polymen AG and a long courses of Aquacel Ag. He is not a candidate for advanced treatment options for many reasons 04/09/17 on evaluation today patient's right lower extremity wounds appear to be doing a little better. Fortunately he has no significant discomfort and has been tolerating the dressing changes including the wraps without complication. With that being said he really does not have swelling anymore compared to what he has had in the past following his vascular intervention. He wonders if potentially we could attempt avoiding the rats to see if he could cleanse the wound in between to try to prevent some of the fiber and its buildup that occurs in the interim between when we see him week to week. No fevers, chills, nausea, or vomiting noted at this time. 04/16/17; noted that the staff made a choice last week not to put him in compression. The patient is changing his dressing at home using [facility] and changing every second day. His wife is washing the wound with saline. He is using kerlix. Surprisingly he has not developed a lot of edema. This choice was made because of the degree of fluid retention and maceration even with changing his dressing twice a week 04/23/17; absolutely no change. Using Hydrofera Blue. Recurrent tightly adherent nonviable surface material. This is been refractory to Iodoflex, sorbact and now Hydrofera Blue. More recently he has been changing his own dressings at home, cleansing the wound in the shower. He has not developed lower extremity edema 04/30/17; if anything the wound is larger still in adherent surface although it debrided easily today. I've been using Mehta honey for 1 week and I'd like to try and do it for a second week this see if we can get some form of viable surface here. 05/07/17; patient arrives with copious amounts of  drainage and some pain in the superior part of the wound. He has not been systemically unwell. He's been changing this daily at home 05/14/17; patient arrives today complaining of less drainage and less pain. Dimensions slightly better. Surface culture I did of this last week grew MSSA I put him on dicloxacillin for 10 days. Patient requesting a further prescription since he feels so much better. He did not obtain the kerramax AG for reasons that aren't clear, he has been  using Hydrofera Blue 05/21/17; perhaps some less drainage and less pain. He is completing another week of doxycycline. Unfortunately there is no change in the wound measurements are appearance still tightly adherent necrotic surface material that is really defied treatment. We have been using Hydrofera Blue most recently. He did not manage to get Anasept. He was told it was prescription at [facility] 05/29/17; absolutely no improvement in these wound areas. He had remote plastic surgery with who was done at [facility] in [place] surrounding this area. This was a traumatic wound with extensive plastic surgery/skin grafting at that time. I switched him to sorbact last week to see if he can do anything about the recurrent necrotic surface and drainage. This is largely been refractory to Iodoflex/Hydrofera Blue/alginates. I believe we tried Elby Showers and more recently Medi honey l however the drainage is really too excessive 06/04/17; patient went to see Dr. Ulice Bold. Per the patient she ordered him a compression stocking. Continued the Anasept gel and sorbact was already on. No major improvement in the condition of the wounds in fact the medial wound looks larger. Again per the patient she did not feel a skin graft or operative debridement was indicated The patient had previous venous ablation in February 2018. Last arterial studies were in December 2017 and were within normal limits 06/18/17; patient arrives back in clinic today  using Anasept gel with sorbact. He changes this every day is wearing his own compression stocking. The patient actually saw Dr. Leta Baptist of plastic surgery. I've reviewed her note. The appointment was on 05/30/17. The basic issue with here was that she did not feel that skin grafts for patients with underlying stasis and edema have a good long-term success rate. She also discuss skin substitutes which has been done here as well however I have not been able to get the surface of these wounds to something that I think would support skin substitutes. This is why he felt he might need an NYGEL, PROKOP. ([id number]) operative debridement more aggressively than I can do in this clinic Review of systems; is otherwise negative he feels well 07/02/17; patient has been using Anasept gel with Sorbact. He changes this every day scrubs out the wound beds. Arrives today with the wound bed looking some better. Easier debridement 07/16/17; patient has been using Anasept gel was sorbact for about a month now. The necrotic service on his wound bed is better and the drainage is now down to minimal but we've not made any improvements in the epithelialization. Change to Santyl today. Surface of the wound is still not good enough to support skin substitutes 07/30/17 on evaluation today patient appears to be doing very well in regard to his right bimalleolus wounds. He has been tolerating the treatment with the Anasept gel and sorbact. However we were going to try and switch to Santyl to be more aggressive with these wounds. This was however cost prohibitive. Therefore we will likely go back to the sorbact. Nonetheless he is not having any significant discomfort he still is forming slough over the wound location but nothing too significant. The wounds do appear to be filling in nicely. 08/13/17; I have not seen this wound in about a month. There is not much change. The fact the area medially looks larger. He has  been using sorbact and Anasept for over a month now without much effect. Arrives in clinic stating that he does not want the area debrided 08/28/17; patient arrives with the areas on his right  medial and right lateral ankle worse. The wounds of expanded there is erythema and still drainage. There is no pain and no tenderness. We had been using Keracel AG clearly not doing well. 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 He states that even the evening that we began utilizing the Iodoflex he started having burning. Subsequently this never really improved and he eventually discontinue the use of the Iodoflex altogether. Unfortunately he did not let us know about any of this until today. I'm unsure of any specific infection at this point although I am going to obtain a wound culture today in order to see if there's anything that could potentially be causing an infection as well. It does sound as if he's had the Iodoflex before and did not have this kind of reaction although this does sound very specific for a reaction to the Iodoflex. 09/17/17 on evaluation today patient appears to be doing significantly better compared to last week's evaluation. At that time it actually appears that he did have an infection the good news is that we have been able to get this under control with switching to the [facility] solution he's not having as much pain and discomfort he still does have some erythema surrounding the medial aspect wound. He has been tolerating the Dakin's soaked gauze dressing which is excellent and that his wounds do not seem to have as much adherent slough. Overall I'm pleased with the progress she's made in last week. 11/05/17 on evaluation  today patient appears to be doing better in regard to his right medial and lateral malleolus ulcers. He has been tolerating the dressing changes without complication. I do flight the Freada Bergeron is helping with additional granulation at this point in the wound beds do appear to be a little bit better. With that being said patient does not appear to have any evidence of significant infection at this point there is no erythema as he has previously noted he does note that the 30 day course of antibiotics I placed him on will end tomorrow. 11/12/17 on evaluation today patient actually appears to be doing excellent in regard to his right medial and lateral malleolus ulcers. He has been tolerating the dressing changes without complication. Fortunately he has no evidence of infection at this time and overall I believe he is progressing extremely nicely he has a lot of new epithelialization noted on evaluation today. Patient likewise is very pleased with the progress even just since last week. 11/19/17 on evaluation today patient appears to be doing about the same in regard to his ulcerations. He does not have any additional breakdown although he really has not noted any significant improvement either over the past week. With that being said the more concerning thing at this time is that he is beginning to show signs of erythema surrounding both wounds which is what typically happens and then he will develop into a more overt infection. This has been the course for some time. I hope that doing the 30 day in a biotic cycle this past time would break that so that he would not have the same type of thing happen again. Nonetheless it appears that is digressing back into this infected state unfortunately. With  that being said he is not having any pain currently which is good he typically does start to hurt more as this progresses. Fortunately there does not appear to be any evidence of infection systemically which is  good news. 11/26/17 on evaluation today patient appears to actually be doing rather well in regard to his ulcer compared to last week. He still has your theme and noted around both the medial and lateral malleolus ulcers of the right lower extremity. He does not seem to be quite as overtly infected however. He has been tolerating the dressing changes without complication which is good news. The gentamicin cream does seem to have been of benefit for him. With that being said he does actually have an appointment with Dr. Sampson Goon this morning to see if there's anything that would be recommended in the way of IV antibiotic [name], [name] therapy. Otherwise he still has slough noted on the surface of the wound. 12/03/17 on evaluation today patient presents for follow-up concerning his ongoing right lower extremity ulcers. He has been tolerating the dressing changes without complication he states he has not really been using gentamicin on the lateral ankle and this seems to be doing very well. For that reason I think that is probably fine to put a hold on the gentamicin he states his burns and stings when he applies it and we maybe be able to just use this intermittently when things become more irritated. Other than that the good news is I did review his MRI which was performed on 11/28/17 and revealed that he has a negative MRI for abscess, osteomyelitis, or septic joint. Obviously these were the issues that I was concerned with the possibility of and I'm pleased to find that there is no problems in that regard. I did also review Dr. Jarrett Ables note from infectious disease he discussed performed some lab work which apparently was done and then subsequently was going to consider the possibility of Keflex long-term for prevention of infection although this has not been prescribed as of yet. 12/09/17 on evaluation today patient appears to be doing a little bit worse in regard to his right  medial and lateral malleolus ulcers. Fortunately the wound does not seem to be significantly worse although he does have a lot more drainage especially the lateral aspect he is having a lot more pain than what he has noted more recently. Fortunately there does not appear to be any evidence of systemic infection which is good news. Again he has seen Dr. Sampson Goon but he has not been placed on the potential Keflex which was recommended as a possibility at least yet. 12/17/17 unfortunately on evaluation today the patient's wound appears to be doing much worse. He has been performing the dressing changes as directed. Upon a review of notes in epic it does appear that Dr. Jarrett Ables office specifically sending Keflex for the patient on the 16th which was last Tuesday. With that being said the patient states that though this was [facility] into [facility] that [facility] stated [facility] never received it and subsequently the patient never took anything. He tells me he has been in bed since Friday due to the increased pain and overall general malaise. No fevers, chills, nausea, or vomiting noted at this time. Patient History Information obtained from Patient. Social History Former smoker - 10 years ago, Marital Status - Married, Alcohol Use - Moderate, Drug Use - No History, Caffeine Use - Never. Medical And Surgical History Notes Constitutional Symptoms (General Health)  Vein Filter (groin area); CODA; H/O Blood Clots; pulmonary hypertensive arterial disease Review of Systems (ROS) Constitutional Symptoms (General Health) Denies complaints or symptoms of Fever, Chills. Respiratory The patient has no complaints or symptoms. Cardiovascular The patient has no complaints or symptoms. Psychiatric The patient has no complaints or symptoms. Objective Constitutional Well-nourished and well-hydrated in no acute distress. [name], [name] ([id number]) Vitals Time Taken: 8:07 AM, Height: 76 in, Weight: 238 lbs,  BMI: 29, Temperature: 98.1 F, Pulse: 64 bpm, Respiratory Rate: 18 breaths/min, Blood Pressure: 136/66 mmHg. Respiratory normal breathing without difficulty. clear to auscultation bilaterally. Cardiovascular regular rate and rhythm with normal S1, S2. Psychiatric this patient is able to make decisions and demonstrates good insight into disease process. Alert and Oriented x 3. patient is very depressed today concerning the status of his leg. She states that he has work to do but that he can t do it.. General Notes: Upon inspection patient's wound is significantly erythematous surrounding and spreading up the leg he actually has blistered areas noted as well unfortunatly. This definitely seems to be worse. When I question the patient regarding the antibiotics he states that he kept checking with [facility] and they stated they never received it. I'm not sure that he ever contacted Dr. Jarrett Ables office in follow-up. Integumentary (Hair, Skin) Wound #1 status is Open. Original cause of wound was Gradually Appeared. The wound is located on the Right,Lateral Malleolus. The wound measures 6.5cm length x 6.5cm width x 0.2cm depth; 33.183cm^2 area and 6.637cm^3 volume. There is Fat Layer (Subcutaneous Tissue) Exposed exposed. There is no tunneling or undermining noted. There is a large amount of purulent drainage noted. The wound margin is distinct with the outline attached to the wound base. There is small (1-33%) red granulation within the wound bed. There is a large (67-100%) amount of necrotic tissue within the wound bed including Adherent Slough. The periwound skin appearance exhibited: Excoriation, Maceration, Erythema. The periwound skin appearance did not exhibit: Callus, Crepitus, Induration, Rash, Scarring, Dry/Scaly, Atrophie Blanche, Cyanosis, Ecchymosis, Hemosiderin Staining, Mottled, Pallor, Rubor. The surrounding wound skin color is noted with erythema which is circumferential. Periwound  temperature was noted as No Abnormality. The periwound has tenderness on palpation. Wound #2 status is Open. Original cause of wound was Gradually Appeared. The wound is located on the Right,Medial Malleolus. The wound measures 4cm length x 3.6cm width x 0.1cm depth; 11.31cm^2 area and 1.131cm^3 volume. There is Fat Layer (Subcutaneous Tissue) Exposed exposed. There is no tunneling or undermining noted. There is a large amount of purulent drainage noted. The wound margin is distinct with the outline attached to the wound base. There is medium (34- 66%) red granulation within the wound bed. There is a medium (34-66%) amount of necrotic tissue within the wound bed including Adherent Slough. The periwound skin appearance exhibited: Erythema. The periwound skin appearance did not exhibit: Callus, Crepitus, Excoriation, Induration, Rash, Scarring, Dry/Scaly, Maceration, Atrophie Blanche, Cyanosis, Ecchymosis, Hemosiderin Staining, Mottled, Pallor, Rubor. The surrounding wound skin color is noted with erythema which is circumferential. Periwound temperature was noted as No Abnormality. The periwound has tenderness on palpation. Wound #4 status is Open. Original cause of wound was Gradually Appeared. The wound is located on the Right,Midline Lower Leg. The wound measures 1cm length x 1.5cm width x 0.1cm depth; 1.178cm^2 area and 0.118cm^3 volume. There is no tunneling or undermining noted. There is a large amount of purulent drainage noted. The wound margin is flat and intact. There is large (67-100%) red granulation  within the wound bed. There is no necrotic tissue within the wound bed. The periwound skin appearance exhibited: Excoriation, Maceration, Erythema. The periwound skin appearance did not exhibit: Callus, Crepitus, Induration, Rash, Scarring, Dry/Scaly, Atrophie Blanche, Cyanosis, Ecchymosis, Hemosiderin Staining, Mottled, Pallor, Rubor. The surrounding wound skin color is noted with erythema  which is circumferential. Periwound temperature was noted as No Abnormality. The periwound has tenderness on palpation. 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 Cleanse wound with mild soap and water May Shower, gently pat wound dry prior to applying new dressing. Wound #2 Right,Medial Malleolus: Clean wound with Normal Saline. Cleanse wound with mild soap and water May Shower, gently pat wound dry prior to applying new dressing. Wound #4 Right,Midline Lower Leg: Clean wound with Normal Saline. Cleanse wound with mild soap and water May Shower, gently pat wound dry prior to applying new dressing. Anesthetic (add to Medication List): Wound #1 Right,Lateral Malleolus: Topical Lidocaine 4% cream applied to wound bed prior to debridement (In Clinic Only). Wound #2 Right,Medial Malleolus: Topical Lidocaine 4% cream applied to wound bed prior to debridement (In Clinic Only). Skin Barriers/Peri-Wound Care: Wound #1 Right,Lateral Malleolus: Barrier cream Wound #4 Right,Midline Lower Leg: Barrier cream Wound #2 Right,Medial Malleolus: Barrier cream Primary Wound Dressing: Wound #1 Right,Lateral Malleolus: Gentamicin Sulfate Cream Silver Alginate Wound #4 Right,Midline Lower Leg: Gentamicin Sulfate Cream Silver Alginate Wound #2 Right,Medial Malleolus: Gentamicin Sulfate Cream Silver Alginate Secondary Dressing: Wound #1 Right,Lateral Malleolus: ABD pad Dry  Gauze Conform/Kerlix XtraSorb Wound #4 Right,Midline Lower Leg: ABD pad Dry Gauze Conform/Kerlix XtraSorb Wound #2 Right,Medial Malleolus: ABD pad Pflug, Cordarro C. ([id number]) Dry Gauze Conform/Kerlix XtraSorb Dressing Change Frequency: Wound #1 Right,Lateral Malleolus: Change dressing every other day. Wound #2 Right,Medial Malleolus: Change dressing every other day. Follow-up Appointments: Wound #1 Right,Lateral Malleolus: Return Appointment in 1 week. Wound #2 Right,Medial Malleolus: Return Appointment in 1 week. Edema Control: Wound #1 Right,Lateral Malleolus: Elevate legs to the level of the heart and pump ankles as often as possible Wound #2 Right,Medial Malleolus: Elevate legs to the level of the heart and pump ankles as often as possible Additional Orders / Instructions: Wound #1 Right,Lateral Malleolus: Increase protein intake. Wound #2 Right,Medial Malleolus: Increase protein intake. Medications-please add to medication list.: Wound #1 Right,Lateral Malleolus: Other: - Vitamin C, Zinc, MVI Wound #2 Right,Medial Malleolus: Other: - Vitamin C, Zinc, MVI I'm gonna suggest at this point in time contacting better Fitzgerald's office which I did do this morning. I was not however able to get in touch with them to make contact we are gonna be calling them back as I missed a return call. Nonetheless we will make sure that we touch bases today and let Mr. Deziel know exactly what needs to be done. He's in agreement this plan. Please see above for specific wound care orders. We will see patient for re-evaluation in 1 week(s) here in the clinic. If anything worsens or changes patient will contact our office for additional recommendations. I did finally get in touch with Dr. Jarrett Ables office and they are going to rescind his prescription to his pharmacy in [place], [place]. Subsequently  I do think that this Keflex will be of benefit for him. Subsequently I do think  that he needs to keep a close eye on this and if it's getting worse let us know. We did speak with his wife whenever Thomasena Edis advised them that the prescription would be at the pharmacy and she states that the patient was actually outside over the weekend planting flowers. This is in contrast to what he told me which was that he had been in bed since Friday. Otherwise we will see were things stand next week. Electronic Signature(s) Signed: 12/17/2017 8:59:01 PM By: Lenda Kelp PA-C Entered By: Lenda Kelp on 12/17/2017 10:44:44 [name] ([id number]) -------------------------------------------------------------------------------- ROS/PFSH Details Patient Name: [name] Date of Service: 12/17/2017 8:00 AM Medical Record Number: [id number] Patient Account Number: [id number] Date of Birth/Sex: 27-Mar-1946 (72 y.o. M) Treating RN: [name] Primary Care Provider: Darreld [name] Other Clinician: Referring Provider: Darreld [name] Treating Provider/Extender: STONE III, [name] in Treatment: 100 Information Obtained From Patient Wound History Do you currently have one or more open woundso Yes How many open wounds do you currently haveo 1 Approximately how long have you had your woundso 5 months How have you been treating your wound(s) until nowo saline, dressing Has your wound(s) ever healed and then re-openedo Yes Have you had any lab work done in the past montho No Have you tested positive for an antibiotic resistant organism (MRSA, VRE)o No Have you tested positive for osteomyelitis (bone infection)o No Have you had any tests for circulation on your legso No Constitutional Symptoms (General Health) Complaints and Symptoms: Negative for: Fever; Chills Medical History: Past Medical History Notes: Vein Filter (groin area); CODA; H/O Blood Clots; pulmonary hypertensive arterial disease Eyes Medical History: Positive for: Cataracts - removed Negative for:  Glaucoma; Optic Neuritis Ear/Nose/Mouth/Throat Medical History: Negative for: Chronic sinus problems/congestion Hematologic/Lymphatic Medical History: Negative for: Anemia; Hemophilia; Human Immunodeficiency Virus; Lymphedema; Sickle Cell Disease Respiratory Complaints and Symptoms: No Complaints or Symptoms Medical History: Positive for: Chronic Obstructive Pulmonary Disease (COPD) Negative for: Aspiration; Asthma; Pneumothorax; Sleep Apnea; Tuberculosis Cardiovascular JASEAN, AMBROSIA. ([id number]) Complaints and Symptoms: No Complaints or Symptoms Medical History: Negative for: Angina; Arrhythmia; Congestive Heart Failure; Coronary Artery Disease; Deep Vein Thrombosis; Hypertension; Hypotension; Myocardial Infarction; Peripheral Arterial Disease; Peripheral Venous Disease; Phlebitis; Vasculitis Gastrointestinal Medical History: Negative for: Cirrhosis ; Colitis; Crohnos; Hepatitis A; Hepatitis B; Hepatitis C Endocrine Medical History: Negative for: Type I Diabetes; Type II Diabetes Genitourinary Medical History: Negative for: End Stage Renal Disease Immunological Medical History: Negative for: Lupus Erythematosus; Raynaudos Integumentary (Skin) Medical History: Negative for: History of Burn; History of pressure wounds Musculoskeletal Medical History: Positive for: Osteoarthritis Negative for: Gout; Rheumatoid Arthritis; Osteomyelitis Neurologic Medical History: Negative for: Dementia; Neuropathy; Quadriplegia; Paraplegia; Seizure Disorder Oncologic Medical History: Negative for: Received Chemotherapy; Received Radiation Psychiatric Complaints and Symptoms: No Complaints or Symptoms Medical History: Negative for: Anorexia/bulimia; Confinement Anxiety HBO Extended History Items HILARIO, ROBARTS. ([id number]) Eyes: Cataracts Immunizations Pneumococcal Vaccine: Received Pneumococcal Vaccination: No Implantable Devices Family and Social History Former  smoker - 10 years ago; Marital Status - Married; Alcohol Use: Moderate; Drug Use: No History; Caffeine Use: Never; Advanced Directives: No; Patient does not want information on Advanced Directives; Living Will: No; Medical Power of Attorney: No Physician Affirmation I have reviewed and agree with the above information. Electronic Signature(s) Signed: 12/17/2017 8:59:01 PM By: Lenda Kelp PA-C Signed: 12/20/2017 4:29:16 PM By: Alejandro Mulling Entered By: Lenda Kelp on  12/17/2017 08:38:17 RAMA, MCCLINTOCK ([id number]) -------------------------------------------------------------------------------- SuperBill Details Patient Name: BADEN, BETSCH. Date of Service: 12/17/2017 Medical Record Number: [id number] Patient Account Number: [id number] Date of Birth/Sex: 1946/08/27 (72 y.o. 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, initial encounter T81.31XD Disruption of external operation (surgical) wound, not elsewhere classified, subsequent encounter Facility Procedures CPT4 Code: [id number] Description: 99214 - WOUND CARE VISIT-LEV 4 EST PT Modifier: Quantity: 1 Physician Procedures CPT4: Description Modifier Quantity Code [id number] 99214 - WC PHYS LEVEL 4 - EST PT 1 ICD-10 Diagnosis Description I87.331 Chronic venous hypertension (idiopathic) with ulcer and inflammation of right lower extremity L97.312 Non-pressure chronic ulcer of  right ankle with fat layer exposed T85.613A Breakdown (mechanical) of artificial skin graft and  decellularized allodermis, initial encounter T81.31XD Disruption of external operation (surgical) wound, not elsewhere classified, subsequent encounter Electronic Signature(s) Signed: 12/17/2017 8:59:01 PM By: Lenda Kelp PA-C Entered By: Lenda Kelp on 12/17/2017 09:11:04

## 2017-12-31 ENCOUNTER — Encounter: Payer: Medicare HMO | Attending: Physician Assistant | Admitting: Physician Assistant

## 2017-12-31 DIAGNOSIS — L97312 Non-pressure chronic ulcer of right ankle with fat layer exposed: Secondary | ICD-10-CM | POA: Insufficient documentation

## 2017-12-31 DIAGNOSIS — L97811 Non-pressure chronic ulcer of other part of right lower leg limited to breakdown of skin: Secondary | ICD-10-CM | POA: Diagnosis not present

## 2017-12-31 DIAGNOSIS — I87331 Chronic venous hypertension (idiopathic) with ulcer and inflammation of right lower extremity: Secondary | ICD-10-CM | POA: Insufficient documentation

## 2017-12-31 DIAGNOSIS — J449 Chronic obstructive pulmonary disease, unspecified: Secondary | ICD-10-CM | POA: Diagnosis not present

## 2017-12-31 DIAGNOSIS — Z87891 Personal history of nicotine dependence: Secondary | ICD-10-CM | POA: Insufficient documentation

## 2017-12-31 DIAGNOSIS — L97319 Non-pressure chronic ulcer of right ankle with unspecified severity: Secondary | ICD-10-CM | POA: Diagnosis present

## 2018-01-02 NOTE — Progress Notes (Signed)
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 Currently he tells me that the pain is doing very well about a 1 out of 10 today. Yesterday was a  little bit worse but he tells me that he was more active watering his flowers that day. Overall he feels that his symptoms are doing significantly better at this point in time. His edema continues to be controlled well with the 4-layer compression wrap and he really has not noted any odor at this point in time. He is tolerating the dressing changes when they are performed well. 06/05/16 at this point in time today patient currently shows no interval signs or symptoms of local or systemic infection. Again his pain level he rates to be a 1 out of 10 at most and overall he tells  me that generally this is not giving him much trouble. In fact he even feels maybe a little bit better than last week. We have continue with the 4-layer compression wrap in which she tolerates very well at this point. He is continuing to utilize the [place]. 06/12/16 I think there has been some progression in the status of both of these wounds over today again covered in a gelatinous surface. Has been using Hydrofera Blue. We had used Iodoflex in the past I'm not sure if there was an issue other than changing to something that might progress towards closure faster 06/19/16; he did not tolerate the Flexeril last week secondary to pain and this was changed on Friday back to [facility] area he continues to have copious amounts of gelatinous surface slough which is think inhibiting the speed of healing this area 06/26/16 patient over the last week has utilized the Santyl to try to loosen up some of the tightly adherent slough that was noted on evaluation last week. The good news is he tells me that the medial malleoli region really does not bother him the right llateral malleoli region is more tender to palpation at this point in time especially in the central/inferior location. However it does appear that the Santyl has done his job to loosen up the adherent slough at this point in time. Fortunately he has  no interval signs or symptoms of infection locally or systemically no purulent discharge noted. 07/03/16 at this point in time today patient's wounds appear to be significantly improved over the right medial and lateral malleolus locations. He has much less tenderness at this point in time and the wounds appear clean her although there is still adherent slough this is sufficiently improved over what I saw last week. I still see no evidence of local infection. 07/10/16; continued gradual improvement in the right medial and lateral malleolus locations. The lateral is more substantial wound now divided into 2 by a rim of normal epithelialization. 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 He denies any complaints of pain or intolerance to compression. Both ulcers are smaller in size oriented today's measurements, both are covered with a softly adherent slough. 07/24/16; medial wound is smaller, lateral about the same although surface looks better. Still using Hydrofera Blue 07/31/16; arrives today complaining of pain in the lateral part of his foot. Nurse reports a lot more drainage. He has been using Hydrofera Blue. Switch to silver alginate today 08/03/2016 -- I was asked to see the patient was here for a nurse visit today. I understand he had a lot of pain in his right lower extremity and was having blisters on his right foot which have not been there before. Though he started on doxycycline he does not have blisters elsewhere on his body. I do not believe this is a drug allergy. also mentioned that there was a copious purulent discharge from the wound and clinically there is no evidence of cellulitis. 08/07/16; I note that the patient came in for his nurse check on Friday apparently with blisters on his toes on the right than a lot of swelling in his forefoot. He continued on the  doxycycline that I had prescribed on 12/8. A culture was done of the lateral wound that showed a combination of a few Proteus and Pseudomonas. Doxycycline might of covered the Proteus but would be unlikely to cover the  Pseudomonas. He is on Coumadin. He arrives in the clinic today feeling a lot better states the pain is a lot better but nothing specific really was done other than to rewrap the foot also noted that he had arterial studies ordered in August although these were never done. It is reasonable to go ahead and reorder these. 08/14/16; generally arrives in a better state today in terms of the wounds he has taken cefdinir for one week. Our intake nurse reports copious amounts of drainage but the patient is complaining of much less pain. He is not had his PT and INR checked and I've asked him to do this today or tomorrow. 08/24/2016 -- patient arrives today after 10 days and said he had a stomach upset. His arterial study was done and I have reviewed this report and find it to be within normal limits. However I did not note any venous duplex studies for reflux, and Dr. Leanord Hawking may have ordered these in the past but I will leave it to him to decide if he needs these. The patient has finished his course of cefdinir. 08/28/16; patient arrives today again with copious amounts of thick really green drainage for our intake nurse. He states he has a very tender spot at the superior part of the lateral wound. Wounds are larger 09/04/16; no real change in the condition of this patient's wound still copious amounts of surface slough. Started him on Iodoflex last week he is completing another course of Cefdinir or which I think was done empirically. His arterial study showed ABIs were 1.1 on the right 1.5 on the left. He did have a slightly reduced ABI in the right the left one was not obtained. Had calcification of the right posterior tibial artery. The interpretation was no segmental stenosis. His  waveforms were triphasic. WOFFORD, STRATTON ([id number]) His reflux studies are later this month. Depending on this I'll send him for a vascular consultation, he may need to see plastic surgery as I believe he is had plastic surgery on this foot in the past. He had an injury to the foot in the 1980s. 1/16 /18 right lateral greater than right medial ankle wounds on the right in the setting of previous skin grafting. 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 He does not have arterial issues. Each week he comes in with the same adherent surface slough although there was less of this today 09/18/16; right lateral greater than right medial lower extremity wounds in the setting of previous skin grafting and trauma. He has least to vein laser ablation scheduled for February 2 for venous reflux. He does not have significant arterial disease. Problem has been very difficult to handle surface slough/necrotic tissue. Recently using Iodoflex for this with some, albeit slow improvement 09/25/16; right lateral greater than right medial lower extremity venous wounds in the setting of previous skin grafting. He is going for ablation surgery on February 2 after this he'll come back here for rewrap. He has been using Iodoflex as the primary dressing. 10/02/16; right lateral greater than right medial lower extremity wounds in the setting of previous skin grafting. He had his ablation surgery last week, I don't have a report. He tolerated this well. Came in with a thigh-high Unna boots on Friday. We have been using Iodoflex as the primary dressing. His measurements are improving 10/09/16; continues to make nice aggressive in terms  of the wounds on his lateral and medial right ankle in the setting of previous skin grafting. Yesterday he noticed drainage at one of his surgical sites from his venous ablation on the right calf. He took off  the bandage over this area felt a "popping" sensation and a reddish-brown drainage. He is not complaining of any pain 10/16/16; he continues to make nice progression in terms of the wounds on the lateral and medial malleolus. Both smaller using Iodoflex. He had a surgical area in his posterior mid calf we have been using iodoform. All the wounds are down and dimensions 10/23/16; the patient arrives today with no complaints. He states the Iodoflex is a bit uncomfortable. He is not systemically unwell. We have been using Iodoflex to the lateral right ankle and the medial and Aquacel Ag to the reflux surgical wound on the posterior right calf. All of these wounds are doing well 10/30/16; patient states he has no pain no systemic symptoms. I changed him to [facility] last week. Although the wounds are doing well 11/06/16; patient reports no pain or systemic symptoms. We continue with Hydrofera Blue. Both wound areas on the medial and lateral ankle appear to be doing well with improvement and dimensions and improvement in the wound bed. 11/13/16; patient's dimensions continued to improve. We continue with Hydrofera Blue on the medial and lateral side. Appear to be doing well with healthy granulation and advancing epithelialization 11/20/16; patient's dimensions improving laterally by about half a centimeter in length. Otherwise no change on the medial side. Using [facility] 12/04/16; no major change in patient's wound dimensions. Intake nurse reports more drainage. 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 He is voicing no complaints or concerns. He has been tolerating his twice weekly compression therapy changes 12/11/16 Patient complains of pain and increased drainage.. wants hydrofera blue 12/18/16 improvement. Sorbact 12/25/16; medial wound is smaller, lateral measures the same. Still on  sorbact 01/01/17; medial wound continues to be smaller, lateral measures about the same however there is clearly advancing epithelialization here as well in fact I think the wound will ultimately divided into 2 open areas 01/08/17; unfortunately today fairly significant regression in several areas. Surface of the lateral wound covered again in adherent necrotic material which is difficult to debridement. He has significant surrounding skin maceration. The expanding area of tissue epithelialization in the middle of the wound that was encouraging last week appears to be smaller. There is no surrounding tenderness. The area on the medial leg also did not seem to be as healthy as last week, the reason for this regression this week is not totally clear. We have been using Sorbact for the last 4 weeks. We'll switched of polymen AG which we will order via home medical supply. If there is a problem with this would switch back to Iodoflex 01/15/18; drainage,odor. No change. Switched to polymen last week 01/22/17; still continuous drainage. Culture I did last week showed a few Proteus pansensitive. I did this culture because of drainage. Put him on Augmentin which she has been taking since Saturday however he is developed 4-5 liquid bowel movements. He is also on Coumadin. Beyond this wound is not changed at all, still nonviable necrotic surface material which I debrided reveals healthy granulation line 01/29/17; still copious amounts of drainage reported by her intake nurse. Wound measuring slightly smaller. Currently fact on Iodoflex although I'm looking forward to  changing back to perhaps Hydrofera Blue or polymen AG 02/05/17; still large amounts of drainage and presenting with really large amounts of adherent slough and necrotic material over the remaining open area of the wound. We have been using Iodoflex but with little improvement in the surface. Change to [facility] 02/12/17; still large amount of  drainage. Much less adherent slough however. Started [employer]	[place] last week AIKEN, WITHEM ([id number]) 02/19/17; drainage is better this week. Much less adherent slough. Perhaps some improvement in dimensions. Using [facility] 02/26/17; severe venous insufficiency wounds on the right lateral and right medial leg. Drainage is some better and slough is less adherent we've been using Hydrofera Blue 03/05/17 on evaluation today patient appears to be doing well. His wounds have been decreasing in size and overall he is pleased with how this is progressing. We are awaiting approval for the epigraph which has previously been recommended in the meantime the [facility] Dressing is to be doing very well for him. 03/12/17; wound dimensions are smaller still using Hydrofera Blue. Comes in on Fridays for a dressing change. 03/19/17; wound dimensions continued to contract. Healing of this wound is complicated by continuous significant drainage as well as recurrent buildup of necrotic surface material. We looked into Apligraf and he has a $290 co-pay per application but truthfully I think the drainage as well as the nonviable surface would preclude use of Apligraf there are any other skin substitute at this point therefore the continued plan will be debridement each clinic visit, 2 times a week dressing changes and continued use of Hydrofera Blue. improvement has been very slow but sustained 03/26/17 perhaps slight improvements in peripheral epithelialization is especially inferiorly. Still with large amount of drainage and tightly adherent necrotic surface on arrival. Along with the intake nurse I reviewed previous treatment. He worsened on Iodoflex and had a 4 week trial of sorbact, Polymen AG and a long courses of Aquacel Ag. He is not a candidate for advanced treatment options for many reasons 04/09/17 on evaluation today patient's right lower extremity wounds appear to be doing a little better.  Fortunately he has no significant discomfort and has been tolerating the dressing changes including the wraps without complication. With that being said he really does not have swelling anymore compared to what he has had in the past following his vascular intervention. He wonders if potentially we could attempt avoiding the rats to see if he could cleanse the wound in between to try to prevent some of the fiber and its buildup that occurs in the interim between when we see him week to week. No fevers, chills, nausea, or vomiting noted at this time. 04/16/17; noted that the staff made a choice last week not to put him in compression. The patient is changing his dressing at home using [facility] and changing every second day. His wife is washing the wound with saline. He is using kerlix. Surprisingly he has not developed a lot of edema. This choice was made because of the degree of fluid retention and maceration even with changing his dressing twice a week 04/23/17; absolutely no change. Using Hydrofera Blue. Recurrent tightly adherent nonviable surface material. This is been refractory to Iodoflex, sorbact and now Hydrofera Blue. More recently he has been changing his own dressings at home, cleansing the wound in the shower. He has not developed lower extremity edema 04/30/17; if anything the wound is larger still in adherent surface although it debrided easily today. I've been using Bufford Buttner  honey for 1 week and I'd like to try and do it for a second week this see if we can get some form of viable surface here. 05/07/17; patient arrives with copious amounts of drainage and some pain in the superior part of the wound. He has not been systemically unwell. He's been changing this daily at home 05/14/17; patient arrives today complaining of less drainage and less pain. Dimensions slightly better. Surface culture I did of this last week grew MSSA I put him on dicloxacillin for 10 days. Patient requesting a  further prescription since he feels so much better. He did not obtain the [facility] AG for reasons that aren't clear, he has been using Hydrofera Blue 05/21/17; perhaps some less drainage and less pain. He is completing another week of doxycycline. Unfortunately there is no change in the wound measurements are appearance still tightly adherent necrotic surface material that is really defied treatment. We have been using Hydrofera Blue most recently. He did not manage to get Anasept. He was told it was prescription at [facility] 05/29/17; absolutely no improvement in these wound areas. He had remote plastic surgery with who was done at [facility] in [place] surrounding this area. This was a traumatic wound with extensive plastic surgery/skin grafting at that time. I switched him to sorbact last week to see if he can do anything about the recurrent necrotic surface and drainage. This is largely been refractory to Iodoflex/Hydrofera Blue/alginates. I believe we tried Elby Showers and more recently Medi honey [name] however the drainage is really too excessive 06/04/17; patient went to see Dr. Ulice Bold. Per the patient she ordered him a compression stocking. Continued the Anasept gel and sorbact was already on. No major improvement in the condition of the wounds in fact the medial wound looks larger. Again per the patient she did not feel a skin graft or operative debridement was indicated The patient had previous venous ablation in February 2018. Last arterial studies were in December 2017 and were within normal limits 06/18/17; patient arrives back in clinic today using Anasept gel with sorbact. He changes this every day is wearing his own compression stocking. The patient actually saw Dr. Leta Baptist of plastic surgery. I've reviewed her note. The appointment was on 05/30/17. The basic issue with here was that she did not feel that skin grafts for patients with underlying stasis and edema have a  good long-term success rate. She also discuss skin substitutes which has been done here as well however I have not been able to get the surface of these wounds to something that I think would support skin substitutes. This is why he felt he might need an EVENS, MENO. ([id number]) operative debridement more aggressively than I can do in this clinic Review of systems; is otherwise negative he feels well 07/02/17; patient has been using Anasept gel with Sorbact. He changes this every day scrubs out the wound beds. Arrives today with the wound bed looking some better. Easier debridement 07/16/17; patient has been using Anasept gel was sorbact for about a month now. The necrotic service on his wound bed is better and the drainage is now down to minimal but we've not made any improvements in the epithelialization. Change to Santyl today. Surface of the wound is still not good enough to support skin substitutes 07/30/17 on evaluation today patient appears to be doing very well in regard to his right bimalleolus wounds. He has been tolerating the treatment with the Anasept gel and sorbact. However we were  going to try and switch to Santyl to be more aggressive with these wounds. This was however cost prohibitive. Therefore we will likely go back to the sorbact. Nonetheless he is not having any significant discomfort he still is forming slough over the wound location but nothing too significant. The wounds do appear to be filling in nicely. 08/13/17; I have not seen this wound in about a month. There is not much change. The fact the area medially looks larger. He has been using sorbact and Anasept for over a month now without much effect. Arrives in clinic stating that he does not want the area debrided 08/28/17; patient arrives with the areas on his right medial and right lateral ankle worse. The wounds of expanded there is erythema and still drainage. There is no pain and no tenderness. We had been  using Keracel AG clearly not doing well. 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 He states that even the evening that we began utilizing the Iodoflex he started having burning. Subsequently this never really improved and he eventually discontinue the use of the Iodoflex altogether. Unfortunately he did not let us know about any of this until today. I'm unsure of any specific infection at this point although I am going to obtain a wound culture today in order to see if there's anything that could potentially be causing an infection as well. It does sound as if he's had the Iodoflex before and did not have this kind of reaction although this does sound very specific for a reaction to the Iodoflex. 09/17/17 on evaluation today patient appears to be doing significantly better compared to last week's evaluation. At that time it actually appears that he did have an infection the good news is that we have been able to get this under control with switching to the [facility] solution he's not having as much pain and discomfort he still does have some erythema surrounding the medial aspect wound. He has been tolerating the Dakin's soaked gauze dressing which is excellent and that his wounds do not seem to have as much adherent slough. Overall I'm pleased with the progress she's made in last week. 11/05/17 on evaluation today patient appears to be doing better in regard to his right medial and lateral malleolus ulcers. He has been tolerating the dressing changes without complication. I do flight the Freada Bergeron is helping with additional granulation at this point in the wound beds do appear to be a little bit better. With that being said patient does not  appear to have any evidence of significant infection at this point there is no erythema as he has previously noted he does note that the 30 day course of antibiotics I placed him on will end tomorrow. 11/12/17 on evaluation today patient actually appears to be doing excellent in regard to his right medial and lateral malleolus ulcers. He has been tolerating the dressing changes without complication. Fortunately he has no evidence of infection at this time and overall I believe he is progressing extremely nicely he has a lot of new epithelialization noted on evaluation today. Patient likewise is very pleased with the progress even just since last week. 11/19/17 on evaluation today patient appears to be doing about  the same in regard to his ulcerations. He does not have any additional breakdown although he really has not noted any significant improvement either over the past week. With that being said the more concerning thing at this time is that he is beginning to show signs of erythema surrounding both wounds which is what typically happens and then he will develop into a more overt infection. This has been the course for some time. I hope that doing the 30 day in a biotic cycle this past time would break that so that he would not have the same type of thing happen again. Nonetheless it appears that is digressing back into this infected state unfortunately. With that being said he is not having any pain currently which is good he typically does start to hurt more as this progresses. Fortunately there does not appear to be any evidence of infection systemically which is good news. 11/26/17 on evaluation today patient appears to actually be doing rather well in regard to his ulcer compared to last week. He still has your theme and noted around both the medial and lateral malleolus ulcers of the right lower extremity. He does not seem to be quite as overtly infected however. He has been tolerating the  dressing changes without complication which is good news. The gentamicin cream does seem to have been of benefit for him. With that being said he does actually have an appointment with Dr. Sampson Goon this morning to see if there's anything that would be recommended in the way of IV antibiotic [name], [name] ([id number]) therapy. Otherwise he still has slough noted on the surface of the wound. 12/03/17 on evaluation today patient presents for follow-up concerning his ongoing right lower extremity ulcers. He has been tolerating the dressing changes without complication he states he has not really been using gentamicin on the lateral ankle and this seems to be doing very well. For that reason I think that is probably fine to put a hold on the gentamicin he states his burns and stings when he applies it and we maybe be able to just use this intermittently when things become more irritated. Other than that the good news is I did review his MRI which was performed on 11/28/17 and revealed that he has a negative MRI for abscess, osteomyelitis, or septic joint. Obviously these were the issues that I was concerned with the possibility of and I'm pleased to find that there is no problems in that regard. I did also review Dr. Jarrett Ables note from infectious disease he discussed performed some lab work which apparently was done and then subsequently was going to consider the possibility of Keflex long-term for prevention of infection although this has not been prescribed as of yet. 12/09/17 on evaluation today patient appears to be doing a little bit worse in regard to his right medial and lateral malleolus ulcers. Fortunately the wound does not seem to be significantly worse although he does have a lot more drainage especially the lateral aspect he is having a lot more pain than what he has noted more recently. Fortunately there does not appear to be any evidence of systemic infection which is good news.  Again he has seen Dr. Sampson Goon but he has not been placed on the potential Keflex which was recommended as a possibility at least yet. 12/17/17 unfortunately on evaluation today the patient's wound appears to be doing much worse. He has been performing the dressing changes as directed. Upon a review of notes in  epic it does appear that Dr. Jarrett Ables office specifically sending Keflex for the patient on the 16th which was last Tuesday. With that being said the patient states that though this was [facility] that [facility] stated they never received it and subsequently the patient never took anything. He tells me he has been in bed since Friday due to the increased pain and overall general malaise. No fevers, chills, nausea, or vomiting noted at this time. 12/31/17 on evaluation today patient appears to be doing very well in regard to his right [employer] ulcers. Definitely much better than when I last saw him. He is on the Keflex as prescribed by Dr. Sampson Goon at this point. Fortunately he shows no signs of infection significantly at this point he also tells me that he is having no pain which is much different from when I last saw him. Currently we are using a silver alginate dressing and gentamicin topically. Patient History Information obtained from Patient. Social History Former smoker - 10 years ago, Marital Status - Married, Alcohol Use - Moderate, Drug Use - No History, Caffeine Use - Never. Medical And Surgical History Notes Constitutional Symptoms (General Health) Vein Filter (groin area); CODA; H/O Blood Clots; pulmonary hypertensive arterial disease Review of Systems (ROS) Constitutional Symptoms (General Health) Denies complaints or symptoms of Fever, Chills. Respiratory The patient has no complaints or symptoms. Cardiovascular Complains or has symptoms of LE edema. Psychiatric The patient has no complaints or symptoms. [name], [name]  ([id number]) Objective Constitutional Well-nourished and well-hydrated in no acute distress. Vitals Time Taken: 8:31 AM, Height: 76 in, Weight: 238 lbs, BMI: 29, Temperature: 98.6 F, Pulse: 70 bpm, Respiratory Rate: 16 breaths/min, Blood Pressure: 140/65 mmHg. Respiratory normal breathing without difficulty. Psychiatric this patient is able to make decisions and demonstrates good insight into disease process. Alert and Oriented x 3. pleasant and cooperative. General Notes: Currently patient's wound did show evidence of slough covering the surface of the wound which did require sharp debridement today. Post debridement the wound bed appears to be doing much better which is good news. With that being said these wounds are still fairly significant as far as the size is concerned we will continue to work on this. Integumentary (Hair, Skin) Wound #1 status is Open. Original cause of wound was Gradually Appeared. The wound is located on the Right,Lateral Malleolus. The wound measures 5.5cm length x 5cm width x 0.2cm depth; 21.598cm^2 area and 4.32cm^3 volume. Wound #2 status is Open. Original cause of wound was Gradually Appeared. The wound is located on the Right,Medial Malleolus. The wound measures 4cm length x 3.5cm width x 0.1cm depth; 10.996cm^2 area and 1.1cm^3 volume. Wound #4 status is Open. Original cause of wound was Gradually Appeared. The wound is located on the Right,Midline Lower Leg. The wound measures 1cm length x 0.5cm width x 0.1cm depth; 0.393cm^2 area and 0.039cm^3 volume. The wound is limited to skin breakdown. There is no tunneling or undermining noted. There is a none present amount of drainage noted. The wound margin is flat and intact. There is no granulation within the wound bed. There is a large (67-100%) amount of necrotic tissue within the wound bed including Eschar. The periwound skin appearance did not exhibit: Callus, Crepitus, Excoriation, Induration, Rash,  Scarring, Dry/Scaly, Maceration, Atrophie Blanche, Cyanosis, Ecchymosis, Hemosiderin Staining, Mottled, Pallor, Rubor, Erythema. Periwound temperature was noted as No Abnormality. The periwound has tenderness on palpation. 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 ([id number]) Wound #1 Pre-procedure diagnosis of Wound #1 is a Venous Leg Ulcer located on the Right,Lateral Malleolus .Severity of Tissue Pre Debridement is: Fat layer exposed. There was a Excisional Skin/Subcutaneous Tissue Debridement with a total area of 27.5 sq cm performed by STONE III, Yoni Lobos E., PA-C. With the following instrument(s): Curette. to remove Viable and Non-Viable tissue/material Material removed includes Subcutaneous Tissue, and Slough, Fibrin/Exudate, and [place] after achieving pain control using Lidocaine 4% Topical Solution. No specimens were taken. A time out was conducted at 09:39, prior to the start of the procedure. A Minimum amount of bleeding was controlled with Pressure. The procedure was tolerated well with a pain level of 0 throughout and a pain level of 0 following the procedure. Patient s Level of Consciousness post procedure was recorded as Awake and Alert and post-procedure vitals were taken including Temperature: 98.7 F, Pulse: 66 bpm, Respiratory Rate: 16 breaths/min, Blood Pressure: (128)/(70) mmHg. Post Debridement Measurements: 5.5cm length x 5cm width x 0.3cm depth; 6.48cm^3 volume. Character of Wound/Ulcer Post Debridement requires further debridement. Severity of Tissue Post  Debridement is: Fat layer exposed. 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 There was a Excisional Skin/Subcutaneous Tissue Debridement with a total area of 14 sq cm performed by STONE III, Yasmine Kilbourne E., PA-C. With the following instrument(s): Curette. to remove Viable and Non-Viable tissue/material Material removed includes Subcutaneous Tissue, and Slough, Fibrin/Exudate, and [place] after achieving pain control using Lidocaine 4% Topical Solution. No specimens were taken. A time out was conducted at 09:39, prior to the start of the procedure. A Minimum amount of bleeding was controlled with Pressure. The procedure was tolerated well with a pain level of 0 throughout and a pain level of 0 following the procedure. Patient s Level of Consciousness post procedure was recorded as Awake and Alert and post-procedure vitals were taken including Temperature: 98.7 F, Pulse: 66 bpm, Respiratory Rate: 16 breaths/min, Blood Pressure: (128)/(70) mmHg. Post Debridement Measurements: 4cm length x 3.5cm width x 0.2cm depth; 2.199cm^3 volume. Character of Wound/Ulcer Post Debridement requires further debridement. Severity of Tissue Post Debridement is: Fat layer exposed. Post procedure Diagnosis Wound #2: Same as Pre-Procedure Plan Wound Cleansing: Wound #1 Right,Lateral Malleolus: Clean wound with Normal Saline. Cleanse wound with mild soap and water May Shower, gently pat wound dry prior to applying new dressing. Wound #2 Right,Medial Malleolus: Clean wound with Normal Saline. Cleanse wound with mild soap and water May Shower, gently pat wound dry prior to applying new dressing. Wound #4 Right,Midline Lower Leg: Clean wound with Normal Saline. Cleanse wound with mild soap and water May Shower, gently pat wound dry prior to  applying new dressing. Anesthetic (add to Medication List): Wound #1 Right,Lateral Malleolus: Topical Lidocaine 4% cream applied to wound bed prior to debridement (In Clinic Only). Wound #2 Right,Medial Malleolus: Topical Lidocaine 4% cream applied to wound bed prior to debridement (In Clinic Only). Skin Barriers/Peri-Wound Care: Wound #1 Right,Lateral Malleolus: [name], LUCUS. ([id number]) Barrier cream Wound #2 Right,Medial Malleolus: Barrier cream Wound #4 Right,Midline Lower Leg: Barrier cream Primary Wound Dressing: Wound #1  Right,Lateral Malleolus: Gentamicin Sulfate Cream Silver Alginate Wound #2 Right,Medial Malleolus: Gentamicin Sulfate Cream Silver Alginate Wound #4 Right,Midline Lower Leg: Gentamicin Sulfate Cream Silver Alginate Secondary Dressing: Wound #1 Right,Lateral Malleolus: ABD pad Dry Gauze Conform/Kerlix XtraSorb Wound #2 Right,Medial Malleolus: ABD pad Dry Gauze Conform/Kerlix XtraSorb Wound #4 Right,Midline Lower Leg: ABD pad Dry Gauze Conform/Kerlix XtraSorb Dressing Change Frequency: Wound #1 Right,Lateral Malleolus: Change dressing every other day. Wound #2 Right,Medial Malleolus: Change dressing every other day. Follow-up Appointments: Wound #1 Right,Lateral Malleolus: Return Appointment in 1 week. Wound #2 Right,Medial Malleolus: Return Appointment in 1 week. Edema Control: Wound #1 Right,Lateral Malleolus: Elevate legs to the level of the heart and pump ankles as often as possible Wound #2 Right,Medial Malleolus: Elevate legs to the level of the heart and pump ankles as often as possible Additional Orders / Instructions: Wound #1 Right,Lateral Malleolus: Increase protein intake. Wound #2 Right,Medial Malleolus: Increase protein intake. Medications-please add to medication list.: Wound #1 Right,Lateral Malleolus: Other: - Vitamin C, Zinc, MVI Wound #2 Right,Medial Malleolus: Other: - Vitamin C, Zinc, MVI The following  medication(s) was prescribed: lidocaine topical 4 % cream 1 1 cream topical was prescribed at facility PATON, CRUM. ([id number]) I'm gonna recommend currently that we continue with the Current wound care measures for the next week. Patient is in agreement that plan. We will see were things stand at that point hopefully he will continue to make progress this is slow but again some progress is better than nothing. Please see above for specific wound care orders. We will see patient for re-evaluation in 1 week(s) here in the clinic. If anything worsens or changes patient will contact our office for additional recommendations. Electronic Signature(s) Signed: 12/31/2017 5:56:33 PM By: Lenda Kelp PA-C Entered By: Lenda Kelp on 12/31/2017 09:51:41 [name] ([id number]) -------------------------------------------------------------------------------- ROS/PFSH Details Patient Name: [name] Date of Service: 12/31/2017 8:30 AM Medical Record Number: [id number] Patient Account Number: [id number] Date of Birth/Sex: [birth year] (72 y.o. M) Treating RN: [name] Primary Care Provider: [name] Other Clinician: Referring Provider: [name] Treating Provider/Extender: STONE III, Dyanara Cozza Weeks in Treatment: 102 Information Obtained From Patient Wound History Do you currently have one or more open woundso Yes How many open wounds do you currently haveo 1 Approximately how long have you had your woundso 5 months How have you been treating your wound(s) until nowo saline, dressing Has your wound(s) ever healed and then re-openedo Yes Have you had any lab work done in the past montho No Have you tested positive for an antibiotic resistant organism (MRSA, VRE)o No Have you tested positive for osteomyelitis (bone infection)o No Have you had any tests for circulation on your legso No Constitutional Symptoms (General Health) Complaints and Symptoms: Negative  for: Fever; Chills Medical History: Past Medical History Notes: Vein Filter (groin area); CODA; H/O Blood Clots; pulmonary hypertensive arterial disease Cardiovascular Complaints and Symptoms: Positive for: LE edema Medical History: Negative for: Angina; Arrhythmia; Congestive Heart Failure; Coronary Artery Disease; Deep Vein Thrombosis; Hypertension; Hypotension; Myocardial Infarction; Peripheral Arterial Disease; Peripheral Venous Disease; Phlebitis; Vasculitis Eyes Medical History: Positive for: Cataracts - removed Negative for: Glaucoma; Optic Neuritis Ear/Nose/Mouth/Throat Medical History: Negative for: Chronic sinus problems/congestion Hematologic/Lymphatic Medical History: Negative for: Anemia; Hemophilia; Human Immunodeficiency Virus; Lymphedema; Sickle Cell Disease Respiratory Prophete, Brion C. ([id number]) Complaints and Symptoms: No Complaints or Symptoms Medical History: Positive for: Chronic Obstructive Pulmonary Disease (COPD) Negative for: Aspiration; Asthma; Pneumothorax; Sleep Apnea; Tuberculosis Gastrointestinal Medical History: Negative for: Cirrhosis ;  Colitis; Crohnos; Hepatitis A; Hepatitis B; Hepatitis C Endocrine Medical History: Negative for: Type I Diabetes; Type II Diabetes Genitourinary Medical History: Negative for: End Stage Renal Disease Immunological Medical History: Negative for: Lupus Erythematosus; Raynaudos Integumentary (Skin) Medical History: Negative for: History of Burn; History of pressure wounds Musculoskeletal Medical History: Positive for: Osteoarthritis Negative for: Gout; Rheumatoid Arthritis; Osteomyelitis Neurologic Medical History: Negative for: Dementia; Neuropathy; Quadriplegia; Paraplegia; Seizure Disorder Oncologic Medical History: Negative for: Received Chemotherapy; Received Radiation Psychiatric Complaints and Symptoms: No Complaints or Symptoms Medical History: Negative for: Anorexia/bulimia;  Confinement Anxiety HBO Extended History Items Eyes: Cataracts Nicotra, Ezrah C. ([id number]) Immunizations Pneumococcal Vaccine: Received Pneumococcal Vaccination: No Implantable Devices Family and Social History Former smoker - 10 years ago; Marital Status - Married; Alcohol Use: Moderate; Drug Use: No History; Caffeine Use: Never; Advanced Directives: No; Patient does not want information on Advanced Directives; Living Will: No; Medical Power of Attorney: No Physician Affirmation I have reviewed and agree with the above information. Electronic Signature(s) Signed: 12/31/2017 5:11:14 PM By: Alejandro Mulling Signed: 12/31/2017 5:56:33 PM By: Lenda Kelp PA-C Entered By: Lenda Kelp on 12/31/2017 09:50:49 [name] ([id number]) -------------------------------------------------------------------------------- SuperBill Details Patient Name: [name] Date of Service: 12/31/2017 Medical Record Number: [id number] Patient Account Number: [id number] Date of Birth/Sex: 08/03/1946 (72 y.o. 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, initial encounter T81.31XD Disruption of external operation (surgical) wound, not elsewhere classified, subsequent encounter Facility Procedures CPT4 Code: [id number] Description: 11042 - DEB SUBQ TISSUE 20 SQ CM/< ICD-10 Diagnosis Description L97.312 Non-pressure chronic ulcer of right ankle with fat layer expo Modifier: sed Quantity: 1 CPT4 Code: [id number] Description: 11045 - DEB SUBQ  TISS EA ADDL 20CM ICD-10 Diagnosis Description L97.312 Non-pressure chronic ulcer of right ankle with fat layer expo Modifier: sed Quantity: 2 Physician Procedures CPT4 Code: [id number] Description: 11042 - WC PHYS SUBQ TISS 20 SQ CM ICD-10 Diagnosis Description L97.312 Non-pressure chronic ulcer of right ankle with fat layer expos Modifier: ed Quantity: 1 CPT4 Code: [id number] Description: 11045 - WC PHYS SUBQ TISS EA ADDL 20 CM ICD-10 Diagnosis Description L97.312 Non-pressure chronic ulcer of right ankle with fat layer expos Modifier: ed Quantity: 2 Electronic Signature(s) Signed: 12/31/2017 5:56:33 PM By: Lenda Kelp PA-C Entered By: Lenda Kelp on 12/31/2017 09:52:03

## 2018-01-07 ENCOUNTER — Encounter: Payer: Medicare HMO | Admitting: Physician Assistant

## 2018-01-07 DIAGNOSIS — I87331 Chronic venous hypertension (idiopathic) with ulcer and inflammation of right lower extremity: Secondary | ICD-10-CM | POA: Diagnosis not present

## 2018-01-09 NOTE — Progress Notes (Signed)
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 really has not noted any odor at this point in time. He is tolerating the dressing changes when  they are performed well. 06/05/16 at this point in time today patient currently shows no interval signs or symptoms of local or systemic infection. Again his pain level he rates to be a 1 out of 10 at most and overall he tells me that generally this is not giving him much trouble. In fact he even feels maybe a little bit better than last week. We have continue with the 4-layer compression wrap in which she tolerates very well at this point. He is continuing to utilize  the Hydrofera Blue dressing. 06/12/16 I think there has been some progression in the status of both of these wounds over today again covered in a gelatinous surface. Has been using Hydrofera Blue. We had used Iodoflex in the past I'm not sure if there was an issue other than changing to something that might progress towards closure faster 06/19/16; he did not tolerate the Flexeril last week secondary to pain and this was changed on Friday back to [facility] area he continues to have copious amounts of gelatinous surface slough which is think inhibiting the speed of healing this area 06/26/16 patient over the last week has utilized the Santyl to try to loosen up some of the tightly adherent slough that was noted on evaluation last week. The good news is he tells me that the medial malleoli region really does not bother him the right llateral malleoli region is more tender to palpation at this point in time especially in the central/inferior location. However it does appear that the Santyl has done his job to loosen up the adherent slough at this point in time. Fortunately he has no interval signs or symptoms of infection locally or systemically no purulent discharge noted. 07/03/16 at this point in time today patient's wounds appear to be significantly improved over the right medial and lateral malleolus locations. He has much less tenderness at this point in time and the wounds appear clean her although there is still adherent  slough this is sufficiently improved over what I saw last week. I still see no evidence of local infection. 07/10/16; continued gradual improvement in the right medial and lateral malleolus locations. The lateral is more substantial wound now divided into 2 by a rim of normal epithelialization. 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 He denies any complaints of pain or intolerance to compression. Both ulcers are smaller in size oriented today's measurements, both are covered with a softly adherent slough. 07/24/16; medial wound is smaller, lateral about the same although surface looks better. Still using Hydrofera Blue 07/31/16; arrives today complaining of pain in the lateral part of his foot. Nurse reports a lot more drainage. He has been using Hydrofera Blue. Switch to silver alginate today 08/03/2016 -- I was asked to see the patient was here for a nurse visit today. I understand he had a lot of pain in his right lower extremity and was having blisters on his right foot which have not been there before. Though he started on doxycycline he does not have blisters elsewhere on his body. I do not believe this is a drug allergy. also mentioned that there was a copious purulent discharge from the wound and clinically there is no evidence of cellulitis. 08/07/16; I note that the patient came in for his nurse check on Friday apparently with blisters on his toes on the right than a lot of swelling in his forefoot. He continued on the doxycycline that I had prescribed on 12/8. A culture was done of the lateral wound that showed a combination of a few Proteus and Pseudomonas. Doxycycline might of covered the Proteus but would be unlikely to cover the Pseudomonas. He is on Coumadin. He arrives in the clinic today feeling a lot better states the pain is a lot better but nothing specific  really was done other than to rewrap the foot also noted that he had arterial studies ordered in August although  these were never done. It is reasonable to go ahead and reorder these. 08/14/16; generally arrives in a better state today in terms of the wounds he has taken cefdinir for one week. Our intake nurse reports copious amounts of drainage but the patient is complaining of much less pain. He is not had his PT and INR checked and I've asked him to do this today or tomorrow. 08/24/2016 -- patient arrives today after 10 days and said he had a stomach upset. His arterial study was done and I have reviewed this report and find it to be within normal limits. However I did not note any venous duplex studies for reflux, and Dr. Leanord Hawking may have ordered these in the past but I will leave it to him to decide if he needs these. The patient has finished his course of cefdinir. 08/28/16; patient arrives today again with copious amounts of thick really green drainage for our intake nurse. He states he has a very tender spot at the superior part of the lateral wound. Wounds are larger 09/04/16; no real change in the condition of this patient's wound still copious amounts of surface slough. Started him on Iodoflex last week he is completing another course of Cefdinir or which I think was done empirically. His arterial study showed ABIs were 1.1 on the right 1.5 on the left. He did have a slightly reduced ABI in the right the left one was not obtained. Had calcification of the right posterior tibial artery. The interpretation was no segmental stenosis. His waveforms were triphasic. [name], [name] ([id number]) His reflux studies are later this month. Depending on this I'll send him for a vascular consultation, he may need to see plastic surgery as I believe he is had plastic surgery on this foot in the past. He had an injury to the foot in the 1980s. 1/16 /18 right lateral greater than right medial ankle  wounds on the right in the setting of previous skin grafting. 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 He does not have arterial issues. Each week he comes in with the same adherent surface slough although there was less of this today 09/18/16; right lateral greater than right medial lower extremity wounds in the setting of previous skin grafting and trauma. He has least to vein laser ablation scheduled for February 2 for venous reflux. He does not have significant arterial disease. Problem has been very difficult to handle surface slough/necrotic tissue. Recently using Iodoflex for this with some, albeit slow improvement 09/25/16; right lateral greater than right medial lower extremity venous wounds in the setting of previous skin grafting. He is going for ablation surgery on February 2 after this he'll come back here for rewrap. He has been using Iodoflex as the primary dressing. 10/02/16; right lateral greater than right medial lower extremity wounds in the setting of previous skin grafting. He had his ablation surgery last week, I don't have a report. He tolerated this well. Came in with a thigh-high Unna boots on Friday. We have been using Iodoflex as the primary dressing. His measurements are improving 10/09/16; continues to make nice aggressive in terms of the wounds on his lateral and medial right ankle in the setting of previous skin grafting. Yesterday he noticed drainage at one of his surgical sites from his venous ablation on the right calf. He took off the bandage over this area felt a "  popping" sensation and a reddish-brown drainage. He is not complaining of any pain 10/16/16; he continues to make nice progression in terms of the wounds on the lateral and medial malleolus. Both smaller using Iodoflex. He had a surgical area in his posterior mid calf we have been using iodoform. All the wounds are down  and dimensions 10/23/16; the patient arrives today with no complaints. He states the Iodoflex is a bit uncomfortable. He is not systemically unwell. We have been using Iodoflex to the lateral right ankle and the medial and Aquacel Ag to the reflux surgical wound on the posterior right calf. All of these wounds are doing well 10/30/16; patient states he has no pain no systemic symptoms. I changed him to [facility] last week. Although the wounds are doing well 11/06/16; patient reports no pain or systemic symptoms. We continue with Hydrofera Blue. Both wound areas on the medial and lateral ankle appear to be doing well with improvement and dimensions and improvement in the wound bed. 11/13/16; patient's dimensions continued to improve. We continue with Hydrofera Blue on the medial and lateral side. Appear to be doing well with healthy granulation and advancing epithelialization 11/20/16; patient's dimensions improving laterally by about half a centimeter in length. Otherwise no change on the medial side. Using [facility] 12/04/16; no major change in patient's wound dimensions. Intake nurse reports more drainage. 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 He is voicing no complaints or concerns. He has been tolerating his twice weekly compression therapy changes 12/11/16 Patient complains of pain and increased drainage.. wants hydrofera blue 12/18/16 improvement. Sorbact 12/25/16; medial wound is smaller, lateral measures the same. Still on sorbact 01/01/17; medial wound continues to be smaller, lateral measures about the same however there is clearly advancing epithelialization here as well in fact I think the wound will ultimately divided into 2 open areas 01/08/17; unfortunately today fairly significant regression in several areas. Surface of the lateral wound covered again in adherent  necrotic material which is difficult to debridement. He has significant surrounding skin maceration. The expanding area of tissue epithelialization in the middle of the wound that was encouraging last week appears to be smaller. There is no surrounding tenderness. The area on the medial leg also did not seem to be as healthy as last week, the reason for this regression this week is not totally clear. We have been using Sorbact for the last 4 weeks. We'll switched of polymen AG which we will order via home medical supply. If there is a problem with this would switch back to Iodoflex 01/15/18; drainage,odor. No change. Switched to polymen last week 01/22/17; still continuous drainage. Culture I did last week showed a few Proteus pansensitive. I did this culture because of drainage. Put him on Augmentin which she has been taking since Saturday however he is developed 4-5 liquid bowel movements. He is also on Coumadin. Beyond this wound is not changed at all, still nonviable necrotic surface material which I debrided reveals healthy granulation line 01/29/17; still copious amounts of drainage reported by her intake nurse. Wound measuring slightly smaller. Currently fact on Iodoflex although I'm looking forward to changing back to perhaps [facility] or polymen AG 02/05/17; still large amounts of drainage and presenting with really large amounts of adherent slough and necrotic material over the remaining open area of the wound. We have been using Iodoflex but with little improvement in  the surface. Change to [facility] 02/12/17; still large amount of drainage. Much less adherent slough however. Started [employer] [place] last week [name], [name] ([id number]) 02/19/17; drainage is better this week. Much less adherent slough. Perhaps some improvement in dimensions. Using [facility] 02/26/17; severe venous insufficiency wounds on the right lateral and right medial leg. Drainage is some better and  slough is less adherent we've been using Hydrofera Blue 03/05/17 on evaluation today patient appears to be doing well. His wounds have been decreasing in size and overall he is pleased with how this is progressing. We are awaiting approval for the epigraph which has previously been recommended in the meantime the [facility] Dressing is to be doing very well for him. 03/12/17; wound dimensions are smaller still using Hydrofera Blue. Comes in on Fridays for a dressing change. 03/19/17; wound dimensions continued to contract. Healing of this wound is complicated by continuous significant drainage as well as recurrent buildup of necrotic surface material. We looked into Apligraf and he has a $290 co-pay per application but truthfully I think the drainage as well as the nonviable surface would preclude use of Apligraf there are any other skin substitute at this point therefore the continued plan will be debridement each clinic visit, 2 times a week dressing changes and continued use of Hydrofera Blue. improvement has been very slow but sustained 03/26/17 perhaps slight improvements in peripheral epithelialization is especially inferiorly. Still with large amount of drainage and tightly adherent necrotic surface on arrival. Along with the intake nurse I reviewed previous treatment. He worsened on Iodoflex and had a 4 week trial of sorbact, Polymen AG and a long courses of Aquacel Ag. He is not a candidate for advanced treatment options for many reasons 04/09/17 on evaluation today patient's right lower extremity wounds appear to be doing a little better. Fortunately he has no significant discomfort and has been tolerating the dressing changes including the wraps without complication. With that being said he really does not have swelling anymore compared to what he has had in the past following his vascular intervention. He wonders if potentially we could attempt avoiding the rats to see if he could  cleanse the wound in between to try to prevent some of the fiber and its buildup that occurs in the interim between when we see him week to week. No fevers, chills, nausea, or vomiting noted at this time. 04/16/17; noted that the staff made a choice last week not to put him in compression. The patient is changing his dressing at home using [facility] and changing every second day. His wife is washing the wound with saline. He is using kerlix. Surprisingly he has not developed a lot of edema. This choice was made because of the degree of fluid retention and maceration even with changing his dressing twice a week 04/23/17; absolutely no change. Using Hydrofera Blue. Recurrent tightly adherent nonviable surface material. This is been refractory to Iodoflex, sorbact and now Hydrofera Blue. More recently he has been changing his own dressings at home, cleansing the wound in the shower. He has not developed lower extremity edema 04/30/17; if anything the wound is larger still in adherent surface although it debrided easily today. I've been using Mehta honey for 1 week and I'd like to try and do it for a second week this see if we can get some form of viable surface here. 05/07/17; patient arrives with copious amounts of drainage and some pain in the superior part of the  wound. He has not been systemically unwell. He's been changing this daily at home 05/14/17; patient arrives today complaining of less drainage and less pain. Dimensions slightly better. Surface culture I did of this last week grew MSSA I put him on dicloxacillin for 10 days. Patient requesting a further prescription since he feels so much better. He did not obtain the [facility] AG for reasons that aren't clear, he has been using Hydrofera Blue 05/21/17; perhaps some less drainage and less pain. He is completing another week of doxycycline. Unfortunately there is no change in the wound measurements are appearance still tightly adherent  necrotic surface material that is really defied treatment. We have been using Hydrofera Blue most recently. He did not manage to get Anasept. He was told it was prescription at [facility] 05/29/17; absolutely no improvement in these wound areas. He had remote plastic surgery with who was done at [facility] in [place] surrounding this area. This was a traumatic wound with extensive plastic surgery/skin grafting at that time. I switched him to sorbact last week to see if he can do anything about the recurrent necrotic surface and drainage. This is largely been refractory to Iodoflex/Hydrofera Blue/alginates. I believe we tried Elby Showers and more recently Medi honey l however the drainage is really too excessive 06/04/17; patient went to see Dr. Ulice Bold. Per the patient she ordered him a compression stocking. Continued the Anasept gel and sorbact was already on. No major improvement in the condition of the wounds in fact the medial wound looks larger. Again per the patient she did not feel a skin graft or operative debridement was indicated The patient had previous venous ablation in February 2018. Last arterial studies were in December 2017 and were within normal limits 06/18/17; patient arrives back in clinic today using Anasept gel with sorbact. He changes this every day is wearing his own compression stocking. The patient actually saw Dr. Leta Baptist of plastic surgery. I've reviewed her note. The appointment was on 05/30/17. The basic issue with here was that she did not feel that skin grafts for patients with underlying stasis and edema have a good long-term success rate. She also discuss skin substitutes which has been done here as well however I have not been able to get the surface of these wounds to something that I think would support skin substitutes. This is why he felt he might need an [name], [name]. ([id number]) operative debridement more aggressively than I can do in this  clinic Review of systems; is otherwise negative he feels well 07/02/17; patient has been using Anasept gel with Sorbact. He changes this every day scrubs out the wound beds. Arrives today with the wound bed looking some better. Easier debridement 07/16/17; patient has been using Anasept gel was sorbact for about a month now. The necrotic service on his wound bed is better and the drainage is now down to minimal but we've not made any improvements in the epithelialization. Change to Santyl today. Surface of the wound is still not good enough to support skin substitutes 07/30/17 on evaluation today patient appears to be doing very well in regard to his right bimalleolus wounds. He has been tolerating the treatment with the Anasept gel and sorbact. However we were going to try and switch to Santyl to be more aggressive with these wounds. This was however cost prohibitive. Therefore we will likely go back to the sorbact. Nonetheless he is not having any significant discomfort he still is forming slough over the wound location  but nothing too significant. The wounds do appear to be filling in nicely. 08/13/17; I have not seen this wound in about a month. There is not much change. The fact the area medially looks larger. He has been using sorbact and Anasept for over a month now without much effect. Arrives in clinic stating that he does not want the area debrided 08/28/17; patient arrives with the areas on his right medial and right lateral ankle worse. The wounds of expanded there is erythema and still drainage. There is no pain and no tenderness. We had been using Keracel AG clearly not doing well. 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  He states that even the evening that we began utilizing the Iodoflex he started having burning. Subsequently this never really improved and he eventually discontinue the use of the Iodoflex altogether. Unfortunately he did not let us know about any of this until today. I'm unsure of any specific infection at this point although I am going to obtain a wound culture today in order to see if there's anything that could potentially be causing an infection as well. It does sound as if he's had the Iodoflex before and did not have this kind of reaction although this does sound very specific for a reaction to the Iodoflex. 09/17/17 on evaluation today patient appears to be doing significantly better compared to last week's evaluation. At that time it actually appears that he did have an infection the good news is that we have been able to get this under control with switching to the [facility] solution he's not having as much pain and discomfort he still does have some erythema surrounding the medial aspect wound. He has been tolerating the Dakin's soaked gauze dressing which is excellent and that his wounds do not seem to have as much adherent slough. Overall I'm pleased with the progress she's made in last week. 11/05/17 on evaluation today patient appears to be doing better in regard to his right medial and lateral malleolus ulcers. He has been tolerating the dressing changes without complication. I do flight the Freada Bergeron is helping with additional granulation at this point in the wound beds do appear to be a little bit better. With that being said patient does not appear to have any evidence of significant infection at this point there is no erythema as he has previously noted he does note that the 30 day course of antibiotics I placed him on will end tomorrow. 11/12/17 on evaluation today patient actually appears to be doing excellent in regard to his right medial and lateral malleolus ulcers. He has been  tolerating the dressing changes without complication. Fortunately he has no evidence of infection at this time and overall I believe he is progressing extremely nicely he has a lot of new epithelialization noted on evaluation today. Patient likewise is very pleased with the progress even just since last week. 11/19/17 on evaluation today patient appears to be doing about the same in regard to his ulcerations. He does not have any additional breakdown although he really has not noted any significant improvement either over the past week. With that being said the more concerning thing at this time is that he is beginning to  show signs of erythema surrounding both wounds which is what typically happens and then he will develop into a more overt infection. This has been the course for some time. I hope that doing the 30 day in a biotic cycle this past time would break that so that he would not have the same type of thing happen again. Nonetheless it appears that is digressing back into this infected state unfortunately. With that being said he is not having any pain currently which is good he typically does start to hurt more as this progresses. Fortunately there does not appear to be any evidence of infection systemically which is good news. 11/26/17 on evaluation today patient appears to actually be doing rather well in regard to his ulcer compared to last week. He still has your theme and noted around both the medial and lateral malleolus ulcers of the right lower extremity. He does not seem to be quite as overtly infected however. He has been tolerating the dressing changes without complication which is good news. The gentamicin cream does seem to have been of benefit for him. With that being said he does actually have an appointment with Dr. Sampson Goon this morning to see if there's anything that would be recommended in the way of IV antibiotic Sebastiano, Robby C. ([id number]) therapy. Otherwise he  still has slough noted on the surface of the wound. 12/03/17 on evaluation today patient presents for follow-up concerning his ongoing right lower extremity ulcers. He has been tolerating the dressing changes without complication he states he has not really been using gentamicin on the lateral ankle and this seems to be doing very well. For that reason I think that is probably fine to put a hold on the gentamicin he states his burns and stings when he applies it and we maybe be able to just use this intermittently when things become more irritated. Other than that the good news is I did review his MRI which was performed on 11/28/17 and revealed that he has a negative MRI for abscess, osteomyelitis, or septic joint. Obviously these were the issues that I was concerned with the possibility of and I'm pleased to find that there is no problems in that regard. I did also review Dr. Jarrett Ables note from infectious disease he discussed performed some lab work which apparently was done and then subsequently was going to consider the possibility of Keflex long-term for prevention of infection although this has not been prescribed as of yet. 12/09/17 on evaluation today patient appears to be doing a little bit worse in regard to his right medial and lateral malleolus ulcers. Fortunately the wound does not seem to be significantly worse although he does have a lot more drainage especially the lateral aspect he is having a lot more pain than what he has noted more recently. Fortunately there does not appear to be any evidence of systemic infection which is good news. Again he has seen Dr. Sampson Goon but he has not been placed on the potential Keflex which was recommended as a possibility at least yet. 12/17/17 unfortunately on evaluation today the patient's wound appears to be doing much worse. He has been performing the dressing changes as directed. Upon a review of notes in epic it does appear that Dr.  Jarrett Ables office specifically sending Keflex for the patient on the 16th which was last Tuesday. With that being said the patient states that though this was sent into [facility] that [facility] stated they never received it and subsequently the  patient never took anything. He tells me he has been in bed since Friday due to the increased pain and overall general malaise. No fevers, chills, nausea, or vomiting noted at this time. 12/31/17 on evaluation today patient appears to be doing very well in regard to his right [employer] ulcers. Definitely much better than when I last saw him. He is on the Keflex as prescribed by Dr. Sampson Goon at this point. Fortunately he shows no signs of infection significantly at this point he also tells me that he is having no pain which is much different from when I last saw him. Currently we are using a silver alginate dressing and gentamicin topically. 01/07/18 on evaluation today patient appears to be doing better overall in my pinion in regard to his wounds. He has been tolerating the dressing changes without complication and overall it seems that the Keflex has been of benefit for him. Patient is happy he states that he is not having pain like he has in the past in fact he's having no pain. Patient History Information obtained from Patient. Social History Former smoker - 10 years ago, Marital Status - Married, Alcohol Use - Moderate, Drug Use - No History, Caffeine Use - Never. Medical And Surgical History Notes Constitutional Symptoms (General Health) Vein Filter (groin area); CODA; H/O Blood Clots; pulmonary hypertensive arterial disease Review of Systems (ROS) Constitutional Symptoms (General Health) Denies complaints or symptoms of Fever, Chills. Respiratory The patient has no complaints or symptoms. Cardiovascular The patient has no complaints or symptoms. Psychiatric The patient has no complaints or symptoms. [name], [name]  ([id number]) Objective Constitutional Well-nourished and well-hydrated in no acute distress. Vitals Time Taken: 8:10 AM, Height: 76 in, Weight: 238 lbs, BMI: 29, Temperature: 98.2 F, Pulse: 68 bpm, Respiratory Rate: 16 breaths/min, Blood Pressure: 138/61 mmHg. Respiratory normal breathing without difficulty. Cardiovascular trace pitting edema of the bilateral lower extremities. Psychiatric this patient is able to make decisions and demonstrates good insight into disease process. Alert and Oriented x 3. pleasant and cooperative. General Notes: Overall the patient has been tolerating the dressing changes without complication his legs did require sharp debridement today as far as the right lower extremity is concerned anyway. Post debridement the wound bed appear to be doing much better. Integumentary (Hair, Skin) Wound #1 status is Open. Original cause of wound was Gradually Appeared. The wound is located on the Right,Lateral Malleolus. The wound measures 5cm length x 6.5cm width x 0.2cm depth; 25.525cm^2 area and 5.105cm^3 volume. Wound #2 status is Open. Original cause of wound was Gradually Appeared. The wound is located on the Right,Medial Malleolus. The wound measures 4cm length x 3.5cm width x 0.1cm depth; 10.996cm^2 area and 1.1cm^3 volume. Wound #4 status is Healed - Epithelialized. Original cause of wound was Gradually Appeared. The wound is located on the Right,Midline Lower Leg. The wound measures 0cm length x 0cm width x 0cm depth; 0cm^2 area and 0cm^3 volume. 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 ([id number]) Pre-procedure diagnosis of Wound #1 is a Venous Leg Ulcer located on the Right,Lateral Malleolus .Severity of Tissue Pre Debridement is: Fat layer exposed. There was a Excisional Skin/Subcutaneous Tissue Debridement with  a total area of 32.5 sq cm performed by STONE III, HOYT E., PA-C. With the following instrument(s): Curette to remove Viable and Non-Viable tissue/material. Material removed includes Subcutaneous Tissue, Slough, and Fibrin/Exudate after achieving pain control using Lidocaine 4% Topical Solution. No specimens were taken. A time out was conducted at 08:27, prior to the start of the procedure. A Minimum amount of bleeding was controlled with Pressure. The procedure was tolerated well with a pain level of 0 throughout and a pain level of 0 following the procedure. Patient s Level of Consciousness post procedure was recorded as Awake and Alert and post-procedure vitals were taken including Temperature: 98.2 F, Pulse: 60 bpm, Respiratory Rate: 16 breaths/min, Blood Pressure: (138)/(61) mmHg. Post Debridement Measurements: 5cm length x 6.5cm width x 0.3cm depth; 7.658cm^3 volume. Character of Wound/Ulcer Post Debridement requires further debridement. Severity of Tissue Post Debridement is: Fat layer exposed. 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 There was a Excisional Skin/Subcutaneous Tissue Debridement with a total area of 14 sq cm performed by STONE III, HOYT E., PA-C. With the following instrument(s): Curette to remove Viable and Non-Viable tissue/material. Material removed includes Subcutaneous Tissue, Slough, and Fibrin/Exudate after achieving pain control using Lidocaine 4% Topical Solution. No specimens were taken. A time out was  conducted at 08:27, prior to the start of the procedure. A Minimum amount of bleeding was controlled with Pressure. The procedure was tolerated well with a pain level of 0 throughout and a pain level of 0 following the procedure. Patient s Level of Consciousness post procedure was recorded as Awake and Alert and post-procedure vitals were taken including Temperature: 98.2 F, Pulse: 60 bpm, Respiratory Rate: 16 breaths/min, Blood Pressure: (138)/(61) mmHg. Post Debridement Measurements: 4cm length x 3.5cm width x 0.2cm depth; 2.199cm^3 volume. Character of Wound/Ulcer Post Debridement requires further debridement. Severity of Tissue Post Debridement is: Fat layer exposed. Post procedure Diagnosis Wound #2: Same as Pre-Procedure Plan Wound Cleansing: Wound #1 Right,Lateral Malleolus: Clean wound with Normal Saline. Cleanse wound with mild soap and water May Shower, gently pat wound dry prior to applying new dressing. Wound #2 Right,Medial Malleolus: Clean wound with Normal Saline. Cleanse wound with mild soap and water May Shower, gently pat wound dry prior to applying new dressing. Anesthetic (add to Medication List): Wound #1 Right,Lateral Malleolus: Topical Lidocaine 4% cream applied to wound bed prior to debridement (In Clinic Only). Wound #2 Right,Medial Malleolus: Topical Lidocaine 4% cream applied to wound bed prior to debridement (In Clinic Only). Skin Barriers/Peri-Wound Care: Wound #1 Right,Lateral Malleolus: Barrier cream Wound #2 Right,Medial Malleolus: Barrier cream Primary Wound Dressing: Wound #1 Right,Lateral Malleolus: Gentamicin Sulfate Cream - only on wound bed Marrufo, Kolston C. ([id number]) Silver Alginate - only on wound bed Wound #2 Right,Medial Malleolus: Gentamicin Sulfate Cream - only on wound bed Silver Alginate - only on wound bed Secondary Dressing: Wound #1 Right,Lateral Malleolus: ABD pad Dry Gauze Conform/Kerlix XtraSorb Wound #2 Right,Medial  Malleolus: ABD pad Dry Gauze Conform/Kerlix XtraSorb Dressing Change Frequency: Wound #1 Right,Lateral Malleolus: Change dressing every other day. Wound #2 Right,Medial Malleolus: Change dressing every other day. Follow-up Appointments: Wound #1 Right,Lateral Malleolus: Return Appointment in 1 week. Wound #2 Right,Medial Malleolus: Return Appointment in 1 week. Edema Control: Wound #1 Right,Lateral Malleolus: Elevate legs to the level of the heart and pump ankles as often as possible  Wound #2 Right,Medial Malleolus: Elevate legs to the level of the heart and pump ankles as often as possible Additional Orders / Instructions: Wound #1 Right,Lateral Malleolus: Increase protein intake. Wound #2 Right,Medial Malleolus: Increase protein intake. Medications-please add to medication list.: Wound #1 Right,Lateral Malleolus: Other: - Vitamin C, Zinc, MVI Wound #2 Right,Medial Malleolus: Other: - Vitamin C, Zinc, MVI The following medication(s) was prescribed: lidocaine topical 4 % cream 1 1 cream topical was prescribed at facility Currently I'm gonna suggest that we go ahead and continue with the current wound care measures since he seems to be doing well. Patient is in agreement this plan. We will see were things stand at follow-up. Please see above for specific wound care orders. We will see patient for re-evaluation in 1 week(s) here in the clinic. If anything worsens or changes patient will contact our office for additional recommendations. Electronic Signature(s) Signed: 01/08/2018 12:50:15 AM By: Lenda Kelp PA-C Entered By: Lenda Kelp on 01/07/2018 08:45:03 [name] ([id number]) -------------------------------------------------------------------------------- ROS/PFSH Details Patient Name: [name] Date of Service: 01/07/2018 8:00 AM Medical Record Number: [id number] Patient Account Number: [id number] Date of Birth/Sex: Oct 02, 1945 (72 y.o.  M) Treating RN: [name] Primary Care Provider: [name] Other Clinician: Referring Provider: [name] Treating Provider/Extender: STONE III, HOYT Weeks in Treatment: 103 Information Obtained From Patient Wound History Do you currently have one or more open woundso Yes How many open wounds do you currently haveo 1 Approximately how long have you had your woundso 5 months How have you been treating your wound(s) until nowo saline, dressing Has your wound(s) ever healed and then re-openedo Yes Have you had any lab work done in the past montho No Have you tested positive for an antibiotic resistant organism (MRSA, VRE)o No Have you tested positive for osteomyelitis (bone infection)o No Have you had any tests for circulation on your legso No Constitutional Symptoms (General Health) Complaints and Symptoms: Negative for: Fever; Chills Medical History: Past Medical History Notes: Vein Filter (groin area); CODA; H/O Blood Clots; pulmonary hypertensive arterial disease Eyes Medical History: Positive for: Cataracts - removed Negative for: Glaucoma; Optic Neuritis Ear/Nose/Mouth/Throat Medical History: Negative for: Chronic sinus problems/congestion Hematologic/Lymphatic Medical History: Negative for: Anemia; Hemophilia; Human Immunodeficiency Virus; Lymphedema; Sickle Cell Disease Respiratory Complaints and Symptoms: No Complaints or Symptoms Medical History: Positive for: Chronic Obstructive Pulmonary Disease (COPD) Negative for: Aspiration; Asthma; Pneumothorax; Sleep Apnea; Tuberculosis Cardiovascular ADIEL, ERNEY. ([id number]) Complaints and Symptoms: No Complaints or Symptoms Medical History: Negative for: Angina; Arrhythmia; Congestive Heart Failure; Coronary Artery Disease; Deep Vein Thrombosis; Hypertension; Hypotension; Myocardial Infarction; Peripheral Arterial Disease; Peripheral Venous Disease; Phlebitis; Vasculitis Gastrointestinal Medical  History: Negative for: Cirrhosis ; Colitis; Crohnos; Hepatitis A; Hepatitis B; Hepatitis C Endocrine Medical History: Negative for: Type I Diabetes; Type II Diabetes Genitourinary Medical History: Negative for: End Stage Renal Disease Immunological Medical History: Negative for: Lupus Erythematosus; Raynaudos Integumentary (Skin) Medical History: Negative for: History of Burn; History of pressure wounds Musculoskeletal Medical History: Positive for: Osteoarthritis Negative for: Gout; Rheumatoid Arthritis; Osteomyelitis Neurologic Medical History: Negative for: Dementia; Neuropathy; Quadriplegia; Paraplegia; Seizure Disorder Oncologic Medical History: Negative for: Received Chemotherapy; Received Radiation Psychiatric Complaints and Symptoms: No Complaints or Symptoms Medical History: Negative for: Anorexia/bulimia; Confinement Anxiety HBO Extended History Items JEDADIAH, ABDALLAH. ([id number]) Eyes: Cataracts Immunizations Pneumococcal Vaccine: Received Pneumococcal Vaccination: No Implantable Devices Family and Social History Former smoker - 10 years ago; Marital Status - Married; Alcohol Use: Moderate; Drug Use: No History; Caffeine  Use: Never; Advanced Directives: No; Patient does not want information on Advanced Directives; Living Will: No; Medical Power of Attorney: No Physician Affirmation I have reviewed and agree with the above information. Electronic Signature(s) Signed: 01/08/2018 12:50:15 AM By: Lenda Kelp PA-C Signed: 01/08/2018 4:20:38 PM By: Alejandro Mulling Entered By: Lenda Kelp on 01/07/2018 08:44:13 [name] ([id number]) -------------------------------------------------------------------------------- SuperBill Details Patient Name: [name] Date of Service: 01/07/2018 Medical Record Number: [id number] Patient Account Number: [id number] Date of Birth/Sex: 05-24-1946 (72 y.o. 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, initial encounter T81.31XD Disruption of external operation (surgical) wound, not elsewhere classified, subsequent encounter Facility Procedures CPT4 Code: [id number] Description: 11042 - DEB SUBQ TISSUE 20 SQ CM/< ICD-10 Diagnosis Description L97.312 Non-pressure chronic ulcer of right ankle with fat layer expo Modifier: sed Quantity: 1 CPT4 Code: [id number] Description: 11045 - DEB SUBQ TISS EA ADDL 20CM ICD-10 Diagnosis Description L97.312 Non-pressure chronic ulcer of right ankle with fat layer expo Modifier: sed Quantity: 2 Physician Procedures CPT4 Code: [id number] Description: 11042 - WC PHYS SUBQ TISS 20 SQ CM ICD-10 Diagnosis Description L97.312 Non-pressure chronic ulcer of right ankle with fat layer expos Modifier: ed Quantity: 1 CPT4 Code: [id number] Description: 11045 - WC PHYS SUBQ TISS EA ADDL 20 CM ICD-10 Diagnosis Description L97.312 Non-pressure chronic ulcer of right ankle with fat layer expos Modifier: ed Quantity: 2 Electronic Signature(s) Signed: 01/08/2018 12:50:15 AM By: Lenda Kelp PA-C Entered By: Lenda Kelp on 01/07/2018 08:45:21

## 2018-01-09 NOTE — Progress Notes (Signed)
[name], [name] ([id number]) Visit Report for 01/07/2018 Arrival Information Details Patient Name: [name], [name]. Date of Service: 01/07/2018 8:00 AM Medical Record Number: [id number] Patient Account Number: [id number] Date of Birth/Sex: January 26, 1946 (72 y.o. M) Treating RN: Huel Coventry Primary Care Zaniyah Wernette: [name] Other Clinician: Referring Pricilla Moehle: [name] Treating Azlan Hanway/Extender: Linwood Dibbles, HOYT Weeks in Treatment: 103 Visit Information History Since Last Visit Added or deleted any medications: No Patient Arrived: Ambulatory Any new allergies or adverse reactions: No Arrival Time: 08:09 Had a fall or experienced change in No Accompanied By: self activities of daily living that may affect Transfer Assistance: None risk of falls: Patient Identification Verified: Yes Signs or symptoms of abuse/neglect since last visito No Secondary Verification Process Yes Hospitalized since last visit: No Completed: Implantable device outside of the clinic excluding No Patient Requires Transmission-Based No cellular tissue based products placed in the center Precautions: since last visit: Patient Has Alerts: Yes Has Dressing in Place as Prescribed: Yes Patient Alerts: Patient on Blood Pain Present Now: No Thinner Electronic Signature(s) Signed: 01/07/2018 5:51:43 PM By: Elliot Gurney, BSN, RN, CWS, Kim RN, BSN Entered By: Elliot Gurney, BSN, RN, CWS, Kim on 01/07/2018 08:10:03 [name] ([id number]) -------------------------------------------------------------------------------- Encounter Discharge Information Details Patient Name: [name] Date of Service: 01/07/2018 8:00 AM Medical Record Number: [id number] Patient Account Number: [id number] Date of Birth/Sex: 1946-01-15 (72 y.o. M) Treating RN: [name] Primary Care Makayla Lanter: [name] Other Clinician: Referring Thaddius Manes: [name] Treating Day Greb/Extender: Linwood Dibbles, HOYT Weeks in Treatment:  103 Encounter Discharge Information Items Discharge Condition: Stable Ambulatory Status: Ambulatory Discharge Destination: Home Transportation: Private Auto Accompanied By: self Schedule Follow-up Appointment: No Clinical Summary of Care: Electronic Signature(s) Signed: 01/08/2018 4:20:38 PM By: Alejandro Mulling Entered By: Alejandro Mulling on 01/07/2018 08:45:08 [name] ([id number]) -------------------------------------------------------------------------------- Lower Extremity Assessment Details Patient Name: [name] Date of Service: 01/07/2018 8:00 AM Medical Record Number: [id number] Patient Account Number: [id number] Date of Birth/Sex: 02-Jun-1946 (72 y.o. M) Treating RN: Huel Coventry Primary Care Victoire Deans: [name] Other Clinician: Referring Olof Marcil: [name] Treating Rogelio Waynick/Extender: Linwood Dibbles, HOYT Weeks in Treatment: 103 Vascular Assessment Pulses: Dorsalis Pedis Palpable: [Right:Yes] Posterior Tibial Extremity colors, hair growth, and conditions: Extremity Color: [Right:Hyperpigmented] Hair Growth on Extremity: [Right:No] Temperature of Extremity: [Right:Warm] Capillary Refill: [Right:< 3 seconds] Toe Nail Assessment Left: Right: Thick: Yes Discolored: Yes Deformed: Yes Improper Length and Hygiene: Yes Electronic Signature(s) Signed: 01/07/2018 5:51:43 PM By: Elliot Gurney, BSN, RN, CWS, Kim RN, BSN Entered By: Elliot Gurney, BSN, RN, CWS, Kim on 01/07/2018 08:16:39 [name] ([id number]) -------------------------------------------------------------------------------- Multi Wound Chart Details Patient Name: [name] Date of Service: 01/07/2018 8:00 AM Medical Record Number: [id number] Patient Account Number: [id number] Date of Birth/Sex: 06-10-1946 (72 y.o. M) Treating RN: [name] Primary Care Kinney Sackmann: [name] Other Clinician: Referring Raylee Adamec: [name] Treating Drew Lips/Extender: STONE III, HOYT Weeks  in Treatment: 103 Vital Signs Height(in): 76 Pulse(bpm): 68 Weight(lbs): 238 Blood Pressure(mmHg): 138/61 Body Mass Index(BMI): 29 Temperature(F): 98.2 Respiratory Rate 16 (breaths/min): Photos: [1:No Photos] [2:No Photos] [4:No Photos] Wound Location: [1:Right, Lateral Malleolus] [2:Right, Medial Malleolus] [4:Right, Midline Lower Leg] Wounding Event: [1:Gradually Appeared] [2:Gradually Appeared] [4:Gradually Appeared] Primary Etiology: [1:Venous Leg Ulcer] [2:Venous Leg Ulcer] [4:Venous Leg Ulcer] Date Acquired: [1:08/11/2015] [2:01/30/2016] [4:12/17/2017] Weeks of Treatment: [1:103] [2:101] [4:3] Wound Status: [1:Open] [2:Open] [4:Healed - Epithelialized] Clustered Wound: [1:No] [2:Yes] [4:Yes] Measurements L x W x D [1:5x6.5x0.2] [2:4x3.5x0.1] [4:0x0x0] (cm) Area (cm) : [1:25.525] [2:10.996] [4:0] Volume (cm) : [1:5.105] [2:1.1] [4:0] %  Reduction in Area: [1:-47.70%] [2:4.80%] [4:100.00%] % Reduction in Volume: [1:1.50%] [2:4.80%] [4:100.00%] Classification: [1:Full Thickness Without Exposed Support Structures] [2:Full Thickness Without Exposed Support Structures] [4:Unclassifiable] Periwound Skin Texture: [1:No Abnormalities Noted] [2:No Abnormalities Noted] [4:No Abnormalities Noted] Periwound Skin Moisture: [1:No Abnormalities Noted] [2:No Abnormalities Noted] [4:No Abnormalities Noted] Periwound Skin Color: [1:No Abnormalities Noted No] [2:No Abnormalities Noted No] [4:No Abnormalities Noted No] Treatment Notes Electronic Signature(s) Signed: 01/08/2018 4:20:38 PM By: Alejandro Mulling Entered By: Alejandro Mulling on 01/07/2018 08:25:09 [name] ([id number]) -------------------------------------------------------------------------------- Multi-Disciplinary Care Plan Details Patient Name: [name] Date of Service: 01/07/2018 8:00 AM Medical Record Number: [id number] Patient Account Number: [id number] Date of Birth/Sex: [birth year] (72 y.o.  M) Treating RN: [name] Primary Care Katira Dumais: [name] Other Clinician: Referring Migel Hannis: [name] Treating Maryclare Nydam/Extender: STONE III, HOYT Weeks in Treatment: 103 Active Inactive ` Venous Leg Ulcer Nursing Diagnoses: Knowledge deficit related to disease process and management Goals: Patient will maintain optimal edema control Date Initiated: 04/03/2016 Target Resolution Date: 11/24/2016 Goal Status: Active Interventions: Compression as ordered Treatment Activities: Therapeutic compression applied : 04/03/2016 Notes: ` Wound/Skin Impairment Nursing Diagnoses: Impaired tissue integrity Goals: Ulcer/skin breakdown will heal within 14 weeks Date Initiated: 01/17/2016 Target Resolution Date: 11/24/2016 Goal Status: Active Interventions: Assess ulceration(s) every visit Notes: Electronic Signature(s) Signed: 01/08/2018 4:20:38 PM By: Alejandro Mulling Entered By: Alejandro Mulling on 01/07/2018 08:24:59 [name] ([id number]) -------------------------------------------------------------------------------- Pain Assessment Details Patient Name: [name] Date of Service: 01/07/2018 8:00 AM Medical Record Number: [id number] Patient Account Number: [id number] Date of Birth/Sex: 01-22-1946 (72 y.o. M) Treating RN: Huel Coventry Primary Care Nakaila Freeze: [name] Other Clinician: Referring Janasia Coverdale: [name] Treating Aerilyn Slee/Extender: Linwood Dibbles, HOYT Weeks in Treatment: 103 Active Problems Location of Pain Severity and Description of Pain Patient Has Paino No Site Locations Pain Management and Medication Current Pain Management: Goals for Pain Management Topical or injectable lidocaine is offered to patient for acute pain when surgical debridement is performed. If needed, Patient is instructed to use over the counter pain medication for the following 24-48 hours after debridement. Wound care MDs do not prescribed pain  medications. Patient has chronic pain or uncontrolled pain. Patient has been instructed to make an appointment with their Primary Care Physician for pain management. Electronic Signature(s) Signed: 01/07/2018 5:51:43 PM By: Elliot Gurney, BSN, RN, CWS, Kim RN, BSN Entered By: Elliot Gurney, BSN, RN, CWS, Kim on 01/07/2018 08:10:25 [name] ([id number]) -------------------------------------------------------------------------------- Patient/Caregiver Education Details Patient Name: [name] Date of Service: 01/07/2018 8:00 AM Medical Record Number: [id number] Patient Account Number: [id number] Date of Birth/Gender: 1946-02-04 (72 y.o. M) Treating RN: [name] Primary Care Physician: [name] Other Clinician: Referring Physician: [name] Treating Physician/Extender: Linwood Dibbles, HOYT Weeks in Treatment: 103 Education Assessment Education Provided To: Patient Education Topics Provided Wound/Skin Impairment: Handouts: Caring for Your Ulcer, Other: change dressing as ordered Methods: Demonstration, Explain/Verbal Responses: State content correctly Electronic Signature(s) Signed: 01/08/2018 4:20:38 PM By: Alejandro Mulling Entered By: Alejandro Mulling on 01/07/2018 08:45:23 [name] ([id number]) -------------------------------------------------------------------------------- Wound Assessment Details Patient Name: [name] Date of Service: 01/07/2018 8:00 AM Medical Record Number: [id number] Patient Account Number: [id number] Date of Birth/Sex: 10/06/1945 (72 y.o. M) Treating RN: Huel Coventry Primary Care Navneet Schmuck: [name] Other Clinician: Referring Jacelyn Cuen: [name] Treating Angelik Walls/Extender: STONE III, HOYT Weeks in Treatment: 103 Wound Status Wound Number: 1 Primary Etiology: Venous Leg Ulcer Wound Location: Right, Lateral Malleolus Wound Status: Open Wounding Event: Gradually Appeared Date Acquired: 08/11/2015 Weeks Of  Treatment: 103 Clustered Wound: No Photos Photo Uploaded By: Elliot Gurney, BSN, RN, CWS, Kim on 01/07/2018 09:09:25 Wound Measurements Length: (cm) 5 Width: (cm) 6.5 Depth: (cm) 0.2 Area: (cm) 25.525 Volume: (cm) 5.105 % Reduction in Area: -47.7% % Reduction in Volume: 1.5% Wound Description Full Thickness Without Exposed Support Classification: Structures Periwound Skin Texture Texture Color No Abnormalities Noted: No No Abnormalities Noted: No Moisture No Abnormalities Noted: No Treatment Notes Wound #1 (Right, Lateral Malleolus) 1. Cleansed with: Clean wound with Normal Saline 2. Anesthetic Topical Lidocaine 4% cream to wound bed prior to debridement [name], [name] 3. Peri-wound Care: Barrier cream 4. Dressing Applied: Other dressing (specify in notes) 5. Secondary Dressing Applied ABD Pad Dry Gauze Kerlix/Conform Notes gentamycin cream, silvercel, xtrasorb Electronic Signature(s) Signed: 01/07/2018 5:51:43 PM By: Elliot Gurney, BSN, RN, CWS, Kim RN, BSN Entered By: Elliot Gurney, BSN, RN, CWS, Kim on 01/07/2018 08:15:56 [name] ([id number]) -------------------------------------------------------------------------------- Wound Assessment Details Patient Name: [name] Date of Service: 01/07/2018 8:00 AM Medical Record Number: [id number] Patient Account Number: [id number] Date of Birth/Sex: 10/15/1945 (72 y.o. M) Treating RN: Huel Coventry Primary Care Addaleigh Nicholls: [name] Other Clinician: Referring Bryson Gavia: [name] Treating Jaylean Buenaventura/Extender: Linwood Dibbles, HOYT Weeks in Treatment: 103 Wound Status Wound Number: 2 Primary Etiology: Venous Leg Ulcer Wound Location: Right, Medial Malleolus Wound Status: Open Wounding Event: Gradually Appeared Date Acquired: 01/30/2016 Weeks Of Treatment: 101 Clustered Wound: Yes Photos Photo Uploaded By: Elliot Gurney, BSN, RN, CWS, Kim on 01/07/2018 09:09:25 Wound Measurements Length: (cm) 4 Width: (cm)  3.5 Depth: (cm) 0.1 Area: (cm) 10.996 Volume: (cm) 1.1 % Reduction in Area: 4.8% % Reduction in Volume: 4.8% Wound Description Full Thickness Without Exposed Support Classification: Structures Periwound Skin Texture Texture Color No Abnormalities Noted: No No Abnormalities Noted: No Moisture No Abnormalities Noted: No Treatment Notes Wound #2 (Right, Medial Malleolus) 1. Cleansed with: Clean wound with Normal Saline 2. Anesthetic Topical Lidocaine 4% cream to wound bed prior to debridement [name], [name] ([id number]) 3. Peri-wound Care: Barrier cream 4. Dressing Applied: Other dressing (specify in notes) 5. Secondary Dressing Applied ABD Pad Dry Gauze Kerlix/Conform Notes gentamycin cream, silvercel, xtrasorb Electronic Signature(s) Signed: 01/07/2018 5:51:43 PM By: Elliot Gurney, BSN, RN, CWS, Kim RN, BSN Entered By: Elliot Gurney, BSN, RN, CWS, Kim on 01/07/2018 08:15:56 [name] ([id number]) -------------------------------------------------------------------------------- Wound Assessment Details Patient Name: [name] Date of Service: 01/07/2018 8:00 AM Medical Record Number: [id number] Patient Account Number: [id number] Date of Birth/Sex: 02/20/1946 (72 y.o. M) Treating RN: Huel Coventry Primary Care Anitta Tenny: [name] Other Clinician: Referring Mikeal Winstanley: [name] Treating Adreanna Fickel/Extender: STONE III, HOYT Weeks in Treatment: 103 Wound Status Wound Number: 4 Primary Etiology: Venous Leg Ulcer Wound Location: Right, Midline Lower Leg Wound Status: Healed - Epithelialized Wounding Event: Gradually Appeared Date Acquired: 12/17/2017 Weeks Of Treatment: 3 Clustered Wound: Yes Photos Photo Uploaded By: Elliot Gurney, BSN, RN, CWS, Kim on 01/07/2018 09:10:12 Wound Measurements Length: (cm) 0 Width: (cm) 0 Depth: (cm) 0 Area: (cm) 0 Volume: (cm) 0 % Reduction in Area: 100% % Reduction in Volume: 100% Wound Description Classification:  Unclassifiable Periwound Skin Texture Texture Color No Abnormalities Noted: No No Abnormalities Noted: No Moisture No Abnormalities Noted: No Electronic Signature(s) Signed: 01/07/2018 5:51:43 PM By: Elliot Gurney, BSN, RN, CWS, Kim RN, BSN Entered By: Elliot Gurney, BSN, RN, CWS, Kim on 01/07/2018 08:15:07 [name] ([id number]) -------------------------------------------------------------------------------- Vitals Details Patient Name: [name] Date of Service: 01/07/2018 8:00 AM Medical Record Number: [id number] Patient Account Number: [id number] Date of Birth/Sex:  [birth year] (72 y.o. M) Treating RN: Huel Coventry Primary Care Oniya Mandarino: [name] Other Clinician: Referring Yanelle Sousa: [name] Treating Nicholus Chandran/Extender: Linwood Dibbles, HOYT Weeks in Treatment: 103 Vital Signs Time Taken: 08:10 Temperature (F): 98.2 Height (in): 76 Pulse (bpm): 68 Weight (lbs): 238 Respiratory Rate (breaths/min): 16 Body Mass Index (BMI): 29 Blood Pressure (mmHg): 138/61 Reference Range: 80 - 120 mg / dl Electronic Signature(s) Signed: 01/07/2018 5:51:43 PM By: Elliot Gurney, BSN, RN, CWS, Kim RN, BSN Entered By: Elliot Gurney, BSN, RN, CWS, Kim on 01/07/2018 08:10:46

## 2018-01-14 ENCOUNTER — Encounter: Payer: Medicare HMO | Admitting: Physician Assistant

## 2018-01-14 DIAGNOSIS — I87331 Chronic venous hypertension (idiopathic) with ulcer and inflammation of right lower extremity: Secondary | ICD-10-CM | POA: Diagnosis not present

## 2018-01-15 NOTE — Progress Notes (Signed)
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 better than last week. We have continue with the 4-layer compression wrap in which she tolerates very well at this point. He is continuing to utilize the [place]. 06/12/16 I think there has been some progression in the status of both of these  wounds over today again covered in a gelatinous surface.  Has been using Hydrofera Blue. We had used Iodoflex in the past I'm not sure if there was an issue other than changing to something that might progress towards closure faster 06/19/16; he did not tolerate the Flexeril last week secondary to pain and this was changed on Friday back to [facility] area he continues to have copious amounts of gelatinous surface slough which is think inhibiting the speed of healing this area 06/26/16 patient over the last week has utilized the Santyl to try to loosen up some of the tightly adherent slough that was noted on evaluation last week. The good news is he tells me that the medial malleoli region really does not bother him the right llateral malleoli region is more tender to palpation at this point in time especially in the central/inferior location. However it does appear that the Santyl has done his job to loosen up the adherent slough at this point in time. Fortunately he has no interval signs or symptoms of infection locally or systemically no purulent discharge noted. 07/03/16 at this point in time today patient's wounds appear to be significantly improved over the right medial and lateral malleolus locations. He has much less tenderness at this point in time and the wounds appear clean her although there is still adherent slough this is sufficiently improved over what I saw last week. I still see no evidence of local infection. 07/10/16; continued gradual improvement in the right medial and lateral malleolus locations. The lateral is more substantial wound now divided into 2 by a rim of normal epithelialization. 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 He denies any complaints of pain or intolerance to compression. Both ulcers are smaller in size oriented today's measurements,  both are covered with a softly adherent slough. 07/24/16; medial wound is smaller, lateral about the same although surface looks better. Still using Hydrofera Blue 07/31/16; arrives today complaining of pain in the lateral part of his foot. Nurse reports a lot more drainage. He has been using Hydrofera Blue. Switch to silver alginate today 08/03/2016 -- I was asked to see the patient was here for a nurse visit today. I understand he had a lot of pain in his right lower extremity and was having blisters on his right foot which have not been there before. Though he started on doxycycline he does not have blisters elsewhere on his body. I do not believe this is a drug allergy. also mentioned that there was a copious purulent discharge from the wound and clinically there is no evidence of cellulitis. 08/07/16; I note that the patient came in for his nurse check on Friday apparently with blisters on his toes on the right than a lot of swelling in his forefoot. He continued on the doxycycline that I had prescribed on 12/8. A culture was done of the lateral wound that showed a combination of a few Proteus and Pseudomonas. Doxycycline might of covered the Proteus but would be unlikely to cover the Pseudomonas. He is on Coumadin. He arrives in the clinic today feeling a lot better states the pain is a lot better but nothing specific really was done other than to rewrap the foot also noted that he had arterial studies ordered in August although these were never done. It is reasonable to go ahead and reorder these. 08/14/16; generally arrives in a better state today in terms of the wounds he has taken  cefdinir for one week. Our intake nurse reports copious amounts of drainage but the patient is complaining of much less pain. He is not had his PT and INR checked and I've asked him to do this today or tomorrow. 08/24/2016 -- patient arrives today after 10 days and said he had a stomach upset. His arterial study  was done and I have reviewed this report and find it to be within normal limits. However I did not note any venous duplex studies for reflux, and Dr. Leanord Hawking may have ordered these in the past but I will leave it to him to decide if he needs these. The patient has finished his course of cefdinir. 08/28/16; patient arrives today again with copious amounts of thick really green drainage for our intake nurse. He states he has a very tender spot at the superior part of the lateral wound. Wounds are larger 09/04/16; no real change in the condition of this patient's wound still copious amounts of surface slough. Started him on Iodoflex last week he is completing another course of Cefdinir or which I think was done empirically. His arterial study showed ABIs were 1.1 on the right 1.5 on the left. He did have a slightly reduced ABI in the right the left one was not obtained. Had calcification of the right posterior tibial artery. The interpretation was no segmental stenosis. His waveforms were triphasic. TELESFORO, BROSNAHAN ([id number]) His reflux studies are later this month. Depending on this I'll send him for a vascular consultation, he may need to see plastic surgery as I believe he is had plastic surgery on this foot in the past. He had an injury to the foot in the 1980s. 1/16 /18 right lateral greater than right medial ankle wounds on the right in the setting of previous skin grafting. 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 He does not have arterial issues. Each week he comes in with the same adherent surface slough although there was less of this today 09/18/16; right lateral greater than right medial lower extremity wounds in the setting of previous skin grafting and trauma. He has least to vein laser ablation scheduled for February 2 for venous reflux. He does not have significant arterial disease. Problem has been  very difficult to handle surface slough/necrotic tissue. Recently using Iodoflex for this with some, albeit slow improvement 09/25/16; right lateral greater than right medial lower extremity venous wounds in the setting of previous skin grafting. He is going for ablation surgery on February 2 after this he'll come back here for rewrap. He has been using Iodoflex as the primary dressing. 10/02/16; right lateral greater than right medial lower extremity wounds in the setting of previous skin grafting. He had his ablation surgery last week, I don't have a report. He tolerated this well. Came in with a thigh-high Unna boots on Friday. We have been using Iodoflex as the primary dressing. His measurements are improving 10/09/16; continues to make nice aggressive in terms of the wounds on his lateral and medial right ankle in the setting of previous skin grafting. Yesterday he noticed drainage at one of his surgical sites from his venous ablation on the right calf. He took off the bandage over this area felt a "popping" sensation and a reddish-brown drainage. He is not complaining of any pain 10/16/16; he continues to make nice progression in terms of the wounds on the lateral  and medial malleolus. Both smaller using Iodoflex. He had a surgical area in his posterior mid calf we have been using iodoform. All the wounds are down and dimensions 10/23/16; the patient arrives today with no complaints. He states the Iodoflex is a bit uncomfortable. He is not systemically unwell. We have been using Iodoflex to the lateral right ankle and the medial and Aquacel Ag to the reflux surgical wound on the posterior right calf. All of these wounds are doing well 10/30/16; patient states he has no pain no systemic symptoms. I changed him to [facility] last week. Although the wounds are doing well 11/06/16; patient reports no pain or systemic symptoms. We continue with Hydrofera Blue. Both wound areas on the medial and  lateral ankle appear to be doing well with improvement and dimensions and improvement in the wound bed. 11/13/16; patient's dimensions continued to improve. We continue with Hydrofera Blue on the medial and lateral side. Appear to be doing well with healthy granulation and advancing epithelialization 11/20/16; patient's dimensions improving laterally by about half a centimeter in length. Otherwise no change on the medial side. Using [facility] 12/04/16; no major change in patient's wound dimensions. Intake nurse reports more drainage. 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 He is voicing no complaints or concerns. He has been tolerating his twice weekly compression therapy changes 12/11/16 Patient complains of pain and increased drainage.. wants hydrofera blue 12/18/16 improvement. Sorbact 12/25/16; medial wound is smaller, lateral measures the same. Still on sorbact 01/01/17; medial wound continues to be smaller, lateral measures about the same however there is clearly advancing epithelialization here as well in fact I think the wound will ultimately divided into 2 open areas 01/08/17; unfortunately today fairly significant regression in several areas. Surface of the lateral wound covered again in adherent necrotic material which is difficult to debridement. He has significant surrounding skin maceration. The expanding area of tissue epithelialization in the middle of the wound that was encouraging last week appears to be smaller. There is no surrounding tenderness. The area on the medial leg also did not seem to be as healthy as last week, the reason for this regression this week is not totally clear. We have been using Sorbact for the last 4 weeks. We'll switched of polymen AG which we will order via home medical supply. If there is a problem with this would switch back to Iodoflex 01/15/18;  drainage,odor. No change. Switched to polymen last week 01/22/17; still continuous drainage. Culture I did last week showed a few Proteus pansensitive. I did this culture because of drainage. Put him on Augmentin which she has been taking since Saturday however he is developed 4-5 liquid bowel movements. He is also on Coumadin. Beyond this wound is not changed at all, still nonviable necrotic surface material which I debrided reveals healthy granulation line 01/29/17; still copious amounts of drainage reported by her intake nurse. Wound measuring slightly smaller. Currently fact on Iodoflex although I'm looking forward to changing back to perhaps [facility] or polymen AG 02/05/17; still large amounts of drainage and presenting with really large amounts of adherent slough and necrotic material over the remaining open area of the wound. We have been using Iodoflex but with little improvement in the surface. Change to [facility] 02/12/17; still large amount of drainage. Much less adherent slough however. Started [employer]	[place] last week TRAMEL, WESTBROOK ([id number]) 02/19/17; drainage  is better this week. Much less adherent slough. Perhaps some improvement in dimensions. Using [facility] 02/26/17; severe venous insufficiency wounds on the right lateral and right medial leg. Drainage is some better and slough is less adherent we've been using Hydrofera Blue 03/05/17 on evaluation today patient appears to be doing well. His wounds have been decreasing in size and overall he is pleased with how this is progressing. We are awaiting approval for the epigraph which has previously been recommended in the meantime the [facility] Dressing is to be doing very well for him. 03/12/17; wound dimensions are smaller still using Hydrofera Blue. Comes in on Fridays for a dressing change. 03/19/17; wound dimensions continued to contract. Healing of this wound is complicated by continuous significant drainage  as well as recurrent buildup of necrotic surface material. We looked into Apligraf and he has a $290 co-pay per application but truthfully I think the drainage as well as the nonviable surface would preclude use of Apligraf there are any other skin substitute at this point therefore the continued plan will be debridement each clinic visit, 2 times a week dressing changes and continued use of Hydrofera Blue. improvement has been very slow but sustained 03/26/17 perhaps slight improvements in peripheral epithelialization is especially inferiorly. Still with large amount of drainage and tightly adherent necrotic surface on arrival. Along with the intake nurse I reviewed previous treatment. He worsened on Iodoflex and had a 4 week trial of sorbact, Polymen AG and a long courses of Aquacel Ag. He is not a candidate for advanced treatment options for many reasons 04/09/17 on evaluation today patient's right lower extremity wounds appear to be doing a little better. Fortunately he has no significant discomfort and has been tolerating the dressing changes including the wraps without complication. With that being said he really does not have swelling anymore compared to what he has had in the past following his vascular intervention. He wonders if potentially we could attempt avoiding the rats to see if he could cleanse the wound in between to try to prevent some of the fiber and its buildup that occurs in the interim between when we see him week to week. No fevers, chills, nausea, or vomiting noted at this time. 04/16/17; noted that the staff made a choice last week not to put him in compression. The patient is changing his dressing at home using [facility] and changing every second day. His wife is washing the wound with saline. He is using kerlix. Surprisingly he has not developed a lot of edema. This choice was made because of the degree of fluid retention and maceration even with changing his dressing  twice a week 04/23/17; absolutely no change. Using Hydrofera Blue. Recurrent tightly adherent nonviable surface material. This is been refractory to Iodoflex, sorbact and now Hydrofera Blue. More recently he has been changing his own dressings at home, cleansing the wound in the shower. He has not developed lower extremity edema 04/30/17; if anything the wound is larger still in adherent surface although it debrided easily today. I've been using Mehta honey for 1 week and I'd like to try and do it for a second week this see if we can get some form of viable surface here. 05/07/17; patient arrives with copious amounts of drainage and some pain in the superior part of the wound. He has not been systemically unwell. He's been changing this daily at home 05/14/17; patient arrives today complaining of less drainage and less pain. Dimensions slightly better.  Surface culture I did of this last week grew MSSA I put him on dicloxacillin for 10 days. Patient requesting a further prescription since he feels so much better. He did not obtain the [facility] An AG for reasons that aren't clear, he has been using Hydrofera Blue 05/21/17; perhaps some less drainage and less pain. He is completing another week of doxycycline. Unfortunately there is no change in the wound measurements are appearance still tightly adherent necrotic surface material that is really defied treatment. We have been using Hydrofera Blue most recently. He did not manage to get Anasept. He was told it was prescription at [facility] 05/29/17; absolutely no improvement in these wound areas. He had remote plastic surgery with who was done at [facility] in [place] surrounding this area. This was a traumatic wound with extensive plastic surgery/skin grafting at that time. I switched him to sorbact last week to see if he can do anything about the recurrent necrotic surface and drainage. This is largely been refractory to Iodoflex/Hydrofera  Blue/alginates. I believe we tried Elby Showers and more recently Medi honey l however the drainage is really too excessive 06/04/17; patient went to see Dr. Ulice Bold. Per the patient she ordered him a compression stocking. Continued the Anasept gel and sorbact was already on. No major improvement in the condition of the wounds in fact the medial wound looks larger. Again per the patient she did not feel a skin graft or operative debridement was indicated The patient had previous venous ablation in February 2018. Last arterial studies were in December 2017 and were within normal limits 06/18/17; patient arrives back in clinic today using Anasept gel with sorbact. He changes this every day is wearing his own compression stocking. The patient actually saw Dr. Leta Baptist of plastic surgery. I've reviewed her note. The appointment was on 05/30/17. The basic issue with here was that she did not feel that skin grafts for patients with underlying stasis and edema have a good long-term success rate. She also discuss skin substitutes which has been done here as well however I have not been able to get the surface of these wounds to something that I think would support skin substitutes. This is why he felt he might need an TAJI, BARRETTO. ([id number]) operative debridement more aggressively than I can do in this clinic Review of systems; is otherwise negative he feels well 07/02/17; patient has been using Anasept gel with Sorbact. He changes this every day scrubs out the wound beds. Arrives today with the wound bed looking some better. Easier debridement 07/16/17; patient has been using Anasept gel was sorbact for about a month now. The necrotic service on his wound bed is better and the drainage is now down to minimal but we've not made any improvements in the epithelialization. Change to Santyl today. Surface of the wound is still not good enough to support skin substitutes 07/30/17 on evaluation today  patient appears to be doing very well in regard to his right bimalleolus wounds. He has been tolerating the treatment with the Anasept gel and sorbact. However we were going to try and switch to Santyl to be more aggressive with these wounds. This was however cost prohibitive. Therefore we will likely go back to the sorbact. Nonetheless he is not having any significant discomfort he still is forming slough over the wound location but nothing too significant. The wounds do appear to be filling in nicely. 08/13/17; I have not seen this wound in about a month. There is not much  change. The fact the area medially looks larger. He has been using sorbact and Anasept for over a month now without much effect. Arrives in clinic stating that he does not want the area debrided 08/28/17; patient arrives with the areas on his right medial and right lateral ankle worse. The wounds of expanded there is erythema and still drainage. There is no pain and no tenderness. We had been using Keracel AG clearly not doing well. 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 He states that even the evening that we began utilizing the Iodoflex he started having burning. Subsequently this never really improved and he eventually discontinue the use of the Iodoflex altogether. Unfortunately he did not let us know about any of this until today. I'm unsure of any specific infection at this point although I am going to obtain a wound culture today in order to see if there's anything that could potentially be causing an infection as well. It does sound as if he's had the Iodoflex before and did not have this kind of reaction although this does sound very specific for a reaction  to the Iodoflex. 09/17/17 on evaluation today patient appears to be doing significantly better compared to last week's evaluation. At that time it actually appears that he did have an infection the good news is that we have been able to get this under control with switching to the [facility] solution he's not having as much pain and discomfort he still does have some erythema surrounding the medial aspect wound. He has been tolerating the Dakin's soaked gauze dressing which is excellent and that his wounds do not seem to have as much adherent slough. Overall I'm pleased with the progress she's made in last week. 11/05/17 on evaluation today patient appears to be doing better in regard to his right medial and lateral malleolus ulcers. He has been tolerating the dressing changes without complication. I do flight the Freada Bergeron is helping with additional granulation at this point in the wound beds do appear to be a little bit better. With that being said patient does not appear to have any evidence of significant infection at this point there is no erythema as he has previously noted he does note that the 30 day course of antibiotics I placed him on will end tomorrow. 11/12/17 on evaluation today patient actually appears to be doing excellent in regard to his right medial and lateral malleolus ulcers. He has been tolerating the dressing changes without complication. Fortunately he has no evidence of infection at this time and overall I believe he is progressing extremely nicely he has a lot of new epithelialization noted on evaluation today. Patient likewise is very pleased with the progress even just since last week. 11/19/17 on evaluation today patient appears to be doing about the same in regard to his ulcerations. He does not have any additional breakdown although he really has not noted any significant improvement either over the past week. With that being said the more concerning thing at this time is that  he is beginning to show signs of erythema surrounding both wounds which is what typically happens and then he will develop into a more overt infection. This has been the course for  some time. I hope that doing the 30 day in a biotic cycle this past time would break that so that he would not have the same type of thing happen again. Nonetheless it appears that is digressing back into this infected state unfortunately. With that being said he is not having any pain currently which is good he typically does start to hurt more as this progresses. Fortunately there does not appear to be any evidence of infection systemically which is good news. 11/26/17 on evaluation today patient appears to actually be doing rather well in regard to his ulcer compared to last week. He still has your theme and noted around both the medial and lateral malleolus ulcers of the right lower extremity. He does not seem to be quite as overtly infected however. He has been tolerating the dressing changes without complication which is good news. The gentamicin cream does seem to have been of benefit for him. With that being said he does actually have an appointment with Dr. Sampson Goon this morning to see if there's anything that would be recommended in the way of IV antibiotic Mcbee, Chinonso C. ([id number]) therapy. Otherwise he still has slough noted on the surface of the wound. 12/03/17 on evaluation today patient presents for follow-up concerning his ongoing right lower extremity ulcers. He has been tolerating the dressing changes without complication he states he has not really been using gentamicin on the lateral ankle and this seems to be doing very well. For that reason I think that is probably fine to put a hold on the gentamicin he states his burns and stings when he applies it and we maybe be able to just use this intermittently when things become more irritated. Other than that the good news is I did review his MRI which  was performed on 11/28/17 and revealed that he has a negative MRI for abscess, osteomyelitis, or septic joint. Obviously these were the issues that I was concerned with the possibility of and I'm pleased to find that there is no problems in that regard. I did also review Dr. Jarrett Ables note from infectious disease he discussed performed some lab work which apparently was done and then subsequently was going to consider the possibility of Keflex long-term for prevention of infection although this has not been prescribed as of yet. 12/09/17 on evaluation today patient appears to be doing a little bit worse in regard to his right medial and lateral malleolus ulcers. Fortunately the wound does not seem to be significantly worse although he does have a lot more drainage especially the lateral aspect he is having a lot more pain than what he has noted more recently. Fortunately there does not appear to be any evidence of systemic infection which is good news. Again he has seen Dr. Sampson Goon but he has not been placed on the potential Keflex which was recommended as a possibility at least yet. 12/17/17 unfortunately on evaluation today the patient's wound appears to be doing much worse. He has been performing the dressing changes as directed. Upon a review of notes in epic it does appear that Dr. Jarrett Ables office specifically sending Keflex for the patient on the 16th which was last Tuesday. With that being said the patient states that though this was sent into [facility] that [facility] stated they never received it and subsequently the patient never took anything. He tells me he has been in bed since Friday due to the increased pain and overall general malaise. No fevers, chills, nausea, or vomiting  noted at this time. 12/31/17 on evaluation today patient appears to be doing very well in regard to his right [employer] ulcers. Definitely much better than when I last saw him. He is on the Keflex as prescribed  by Dr. Sampson Goon at this point. Fortunately he shows no signs of infection significantly at this point he also tells me that he is having no pain which is much different from when I last saw him. Currently we are using a silver alginate dressing and gentamicin topically. 01/07/18 on evaluation today patient appears to be doing better overall in my pinion in regard to his wounds. He has been tolerating the dressing changes without complication and overall it seems that the Keflex has been of benefit for him. Patient is happy he states that he is not having pain like he has in the past in fact he's having no pain. 01/14/18 on evaluation today patient appears to be doing better in regard to his right malleolus ulcers. He has been tolerating the dressing changes without complication and in general I feel like things are making great improvement over the past several weeks. I do feel like him being on the Keflex has made a big improvement as well. Overall I'm pleased with how things are progressing. Patient History Information obtained from Patient. Social History Former smoker - 10 years ago, Marital Status - Married, Alcohol Use - Moderate, Drug Use - No History, Caffeine Use - Never. Medical And Surgical History Notes Constitutional Symptoms (General Health) Vein Filter (groin area); CODA; H/O Blood Clots; pulmonary hypertensive arterial disease Review of Systems (ROS) Constitutional Symptoms (General Health) Denies complaints or symptoms of Fever, Chills. Respiratory The patient has no complaints or symptoms. Cardiovascular Complains or has symptoms of LE edema. Psychiatric The patient has no complaints or symptoms. TARIN, NAVAREZ ([id number]) Objective Constitutional Well-nourished and well-hydrated in no acute distress. Vitals Time Taken: 8:13 AM, Height: 76 in, Weight: 238 lbs, BMI: 29, Temperature: 98.2 F, Pulse: 69 bpm, Respiratory Rate: 16 breaths/min, Blood Pressure:  146/74 mmHg. Respiratory normal breathing without difficulty. Psychiatric this patient is able to make decisions and demonstrates good insight into disease process. Alert and Oriented x 3. pleasant and cooperative. General Notes: Patient's wounds show areas of new epithelialization which is good he has been some time without noting any prior to recent weeks. With that being said I'm overall happy with how things seem to be headed. Sharp debridement was required today and post debridement the wound beds appear to be doing much better. Integumentary (Hair, Skin) Wound #1 status is Open. Original cause of wound was Gradually Appeared. The wound is located on the Right,Lateral Malleolus. The wound measures 5.8cm length x 6cm width x 0.2cm depth; 27.332cm^2 area and 5.466cm^3 volume. There is Fat Layer (Subcutaneous Tissue) Exposed exposed. There is no tunneling or undermining noted. There is a large amount of serous drainage noted. The wound margin is distinct with the outline attached to the wound base. There is medium (34-66%) red, pink granulation within the wound bed. There is a medium (34-66%) amount of necrotic tissue within the wound bed including Adherent Slough. The periwound skin appearance did not exhibit: Callus, Crepitus, Excoriation, Induration, Rash, Scarring, Dry/Scaly, Maceration, Atrophie Blanche, Cyanosis, Ecchymosis, Hemosiderin Staining, Mottled, Pallor, Rubor, Erythema. Periwound temperature was noted as No Abnormality. The periwound has tenderness on palpation. Wound #2 status is Open. Original cause of wound was Gradually Appeared. The wound is located on the Right,Medial Malleolus. The wound measures 3.7cm length x  3.2cm width x 0.1cm depth; 9.299cm^2 area and 0.93cm^3 volume. There is Fat Layer (Subcutaneous Tissue) Exposed exposed. There is no tunneling or undermining noted. There is a large amount of serous drainage noted. The wound margin is distinct with the outline  attached to the wound base. There is medium (34-66%) red granulation within the wound bed. There is a medium (34-66%) amount of necrotic tissue within the wound bed including Adherent Slough. The periwound skin appearance did not exhibit: Callus, Crepitus, Excoriation, Induration, Rash, Scarring, Dry/Scaly, Maceration, Atrophie Blanche, Cyanosis, Ecchymosis, Hemosiderin Staining, Mottled, Pallor, Rubor, Erythema. 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 There was a Excisional Skin/Subcutaneous Tissue Debridement with a total area of 34.8 sq cm performed by STONE III, Khoen Genet E., PA-C. With the following instrument(s): Curette to remove Viable and Non-Viable tissue/material. Material removed includes Subcutaneous Tissue, Slough, and Biofilm after achieving pain control using Other (lidocaine 4%). No specimens were taken. A time out was conducted at 08:31, prior to the start of the procedure. A Minimum amount of bleeding was controlled with Pressure. The procedure was tolerated well with a pain level of 2 throughout and a pain level of 2 following the procedure. Patient s Level of Consciousness post procedure was recorded as Awake and Alert and post-procedure vitals were taken including Temperature: 98.2 F, Pulse: 69 bpm, Respiratory Rate: 16 breaths/min, Blood Pressure: (143)/(76) mmHg. Post Debridement  Measurements: 5.8cm length x 6cm width x 0.2cm depth; 5.466cm^3 volume. Character of Wound/Ulcer Post Debridement is stable. Severity of Tissue Post Debridement is: Fat layer exposed. 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 There was a Excisional Skin/Subcutaneous Tissue Debridement with a total area of 11.84 sq cm performed by STONE III, Oland Arquette E., PA-C. With the following instrument(s): Curette to remove Viable and Non-Viable tissue/material. Material removed includes Subcutaneous Tissue, Slough, and Biofilm after achieving pain control using Other (lidocaine 4%). No specimens were taken. A time out was conducted at 08:31, prior to the start of the procedure. A Minimum amount of bleeding was controlled with Pressure. The procedure was tolerated well with a pain level of 2 throughout and a pain level of 2 following the procedure. Patient s Level of Consciousness post procedure was recorded as Awake and Alert and post-procedure vitals were taken including Temperature: 98.2 F, Pulse: 69 bpm, Respiratory Rate: 16 breaths/min, Blood Pressure: (143)/(76) mmHg. Post Debridement Measurements: 3.7cm length x 3.2cm width x 0.2cm depth; 1.86cm^3 volume. Character of Wound/Ulcer Post Debridement is stable. Severity of Tissue Post Debridement is: Fat layer exposed. Post procedure Diagnosis Wound #2: Same as Pre-Procedure Plan Wound Cleansing: Wound #1 Right,Lateral Malleolus: Clean wound with Normal Saline. Cleanse wound with mild soap and water May Shower, gently pat wound dry prior to applying new dressing. Wound #2 Right,Medial Malleolus: Clean wound with Normal Saline. Cleanse wound with mild soap and water May Shower, gently pat wound dry prior  to applying new dressing. Anesthetic (add to Medication List): Wound #1  Right,Lateral Malleolus: Topical Lidocaine 4% cream applied to wound bed prior to debridement (In Clinic Only). Wound #2 Right,Medial Malleolus: Topical Lidocaine 4% cream applied to wound bed prior to debridement (In Clinic Only). DONNOVAN, STAMOUR ([id number]) Skin Barriers/Peri-Wound Care: Wound #1 Right,Lateral Malleolus: Barrier cream Wound #2 Right,Medial Malleolus: Barrier cream Primary Wound Dressing: Wound #1 Right,Lateral Malleolus: Gentamicin Sulfate Cream - only on wound bed Silver Alginate - only on wound bed Wound #2 Right,Medial Malleolus: Gentamicin Sulfate Cream - only on wound bed Silver Alginate - only on wound bed Secondary Dressing: Wound #1 Right,Lateral Malleolus: ABD pad Dry Gauze Conform/Kerlix XtraSorb Wound #2 Right,Medial Malleolus: ABD pad Dry Gauze Conform/Kerlix XtraSorb Dressing Change Frequency: Wound #1 Right,Lateral Malleolus: Change dressing every other day. Wound #2 Right,Medial Malleolus: Change dressing every other day. Follow-up Appointments: Wound #1 Right,Lateral Malleolus: Return Appointment in 1 week. Wound #2 Right,Medial Malleolus: Return Appointment in 1 week. Edema Control: Wound #1 Right,Lateral Malleolus: Elevate legs to the level of the heart and pump ankles as often as possible Wound #2 Right,Medial Malleolus: Elevate legs to the level of the heart and pump ankles as often as possible Additional Orders / Instructions: Wound #1 Right,Lateral Malleolus: Increase protein intake. Wound #2 Right,Medial Malleolus: Increase protein intake. Medications-please add to medication list.: Wound #1 Right,Lateral Malleolus: Other: - Vitamin C, Zinc, MVI Wound #2 Right,Medial Malleolus: Other: - Vitamin C, Zinc, MVI I'm gonna suggest currently that we go ahead and initiate the previous one care orders which have been doing well for him for the next week. He's in agreement the plan. I did suggest that he contact Dr. Sampson Goon  as well since he states that he's running low on the Keflex to see if that something that can be continued since he seems to be doing excellent on this he said no reoccurrence of infection during the time that he's been taking the Keflex. Patient is going to contact that to [place] he tells me. Otherwise we will see him for reevaluation in one weeks time to see were things stand at that point. Please see above for specific wound care orders. We will see patient for re-evaluation in 1 week(s) here in the clinic. If anything worsens or changes patient will contact our office for additional recommendations. NEVAAN, BUNTON ([id number]) Electronic Signature(s) Signed: 01/14/2018 6:53:41 PM By: Lenda Kelp PA-C Entered By: Lenda Kelp on 01/14/2018 09:18:44 [name] ([id number]) -------------------------------------------------------------------------------- ROS/PFSH Details Patient Name: [name] Date of Service: 01/14/2018 8:00 AM Medical Record Number: [id number] Patient Account Number: [id number] Date of Birth/Sex: [birth year] (72 y.o. M) Treating RN: Huel Coventry Primary Care Provider: [name] Other Clinician: Referring Provider: [name] Treating Provider/Extender: Linwood Dibbles, Grasiela Jonsson Weeks in Treatment: 104 Information Obtained From Patient Wound History Do you currently have one or more open woundso Yes How many open wounds do you currently haveo 1 Approximately how long have you had your woundso 5 months How have you been treating your wound(s) until nowo saline, dressing Has your wound(s) ever healed and then re-openedo Yes Have you had any lab work done in the past montho No Have you tested positive for an antibiotic resistant organism (MRSA, VRE)o No Have you tested positive for osteomyelitis (bone infection)o No Have you had any tests for circulation on your legso No Constitutional Symptoms (General Health) Complaints and  Symptoms: Negative for: Fever; Chills Medical History: Past Medical History Notes: Vein Filter (  groin area); CODA; H/O Blood Clots; pulmonary hypertensive arterial disease Cardiovascular Complaints and Symptoms: Positive for: LE edema Medical History: Negative for: Angina; Arrhythmia; Congestive Heart Failure; Coronary Artery Disease; Deep Vein Thrombosis; Hypertension; Hypotension; Myocardial Infarction; Peripheral Arterial Disease; Peripheral Venous Disease; Phlebitis; Vasculitis Eyes Medical History: Positive for: Cataracts - removed Negative for: Glaucoma; Optic Neuritis Ear/Nose/Mouth/Throat Medical History: Negative for: Chronic sinus problems/congestion Hematologic/Lymphatic Medical History: Negative for: Anemia; Hemophilia; Human Immunodeficiency Virus; Lymphedema; Sickle Cell Disease Respiratory Sedlack, Adyn C. ([id number]) Complaints and Symptoms: No Complaints or Symptoms Medical History: Positive for: Chronic Obstructive Pulmonary Disease (COPD) Negative for: Aspiration; Asthma; Pneumothorax; Sleep Apnea; Tuberculosis Gastrointestinal Medical History: Negative for: Cirrhosis ; Colitis; Crohnos; Hepatitis A; Hepatitis B; Hepatitis C Endocrine Medical History: Negative for: Type I Diabetes; Type II Diabetes Genitourinary Medical History: Negative for: End Stage Renal Disease Immunological Medical History: Negative for: Lupus Erythematosus; Raynaudos Integumentary (Skin) Medical History: Negative for: History of Burn; History of pressure wounds Musculoskeletal Medical History: Positive for: Osteoarthritis Negative for: Gout; Rheumatoid Arthritis; Osteomyelitis Neurologic Medical History: Negative for: Dementia; Neuropathy; Quadriplegia; Paraplegia; Seizure Disorder Oncologic Medical History: Negative for: Received Chemotherapy; Received Radiation Psychiatric Complaints and Symptoms: No Complaints or Symptoms Medical History: Negative for:  Anorexia/bulimia; Confinement Anxiety HBO Extended History Items Eyes: Cataracts Henery, Dillian C. ([id number]) Immunizations Pneumococcal Vaccine: Received Pneumococcal Vaccination: No Implantable Devices Family and Social History Former smoker - 10 years ago; Marital Status - Married; Alcohol Use: Moderate; Drug Use: No History; Caffeine Use: Never; Advanced Directives: No; Patient does not want information on Advanced Directives; Living Will: No; Medical Power of Attorney: No Physician Affirmation I have reviewed and agree with the above information. Electronic Signature(s) Signed: 01/14/2018 5:25:47 PM By: Elliot Gurney, BSN, RN, CWS, Kim RN, BSN Signed: 01/14/2018 6:53:41 PM By: Lenda Kelp PA-C Entered By: Lenda Kelp on 01/14/2018 09:17:39 [name] ([id number]) -------------------------------------------------------------------------------- SuperBill Details Patient Name: [name] Date of Service: 01/14/2018 Medical Record Number: [id number] Patient Account Number: [id number] Date of Birth/Sex: 12-21-1945 (72 y.o. 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, initial encounter T81.31XD Disruption of external operation (surgical) wound, not elsewhere classified, subsequent encounter Facility Procedures CPT4 Code: [id number] Description: 11042 - DEB SUBQ TISSUE 20 SQ CM/< ICD-10 Diagnosis Description L97.312 Non-pressure chronic ulcer of right ankle with fat layer expo Modifier: sed Quantity: 1 CPT4 Code:  [id number] Description: 11045 - DEB SUBQ TISS EA ADDL 20CM ICD-10 Diagnosis Description L97.312 Non-pressure chronic ulcer of right ankle with fat layer expo Modifier: sed Quantity: 2 Physician Procedures CPT4 Code: [id number] Description: 11042 - WC PHYS SUBQ TISS 20 SQ CM ICD-10 Diagnosis Description L97.312 Non-pressure chronic ulcer of right ankle with fat layer expos Modifier: ed Quantity: 1 CPT4 Code: [id number] Description: 11045 - WC PHYS SUBQ TISS EA ADDL 20 CM ICD-10 Diagnosis Description L97.312 Non-pressure chronic ulcer of right ankle with fat layer expos Modifier: ed Quantity: 2 Electronic Signature(s) Signed: 01/14/2018 6:53:41 PM By: Lenda Kelp PA-C Entered By: Lenda Kelp on 01/14/2018 09:19:07

## 2018-01-16 NOTE — Progress Notes (Signed)
[name], [name] ([id number]) Visit Report for 01/14/2018 Arrival Information Details Patient Name: [name], [name]. Date of Service: 01/14/2018 8:00 AM Medical Record Number: [id number] Patient Account Number: [id number] Date of Birth/Sex: October 27, 1945 (72 y.o. M) Treating RN: Renne Crigler Primary Care Rexanna Louthan: [name] Other Clinician: Referring January Bergthold: [name] Treating Anthoni Geerts/Extender: Linwood Dibbles, HOYT Weeks in Treatment: 104 Visit Information History Since Last Visit All ordered tests and consults were completed: No Patient Arrived: Ambulatory Added or deleted any medications: No Arrival Time: 08:12 Any new allergies or adverse reactions: No Accompanied By: self Had a fall or experienced change in No Transfer Assistance: None activities of daily living that may affect Patient Identification Verified: Yes risk of falls: Secondary Verification Process Yes Signs or symptoms of abuse/neglect since last visito No Completed: Hospitalized since last visit: No Patient Requires Transmission-Based No Implantable device outside of the clinic excluding No Precautions: cellular tissue based products placed in the center Patient Has Alerts: Yes since last visit: Patient Alerts: Patient on Blood Pain Present Now: No Thinner Electronic Signature(s) Signed: 01/15/2018 7:52:46 AM By: Renne Crigler Entered By: Renne Crigler on 01/14/2018 08:12:58 [name] ([id number]) -------------------------------------------------------------------------------- Encounter Discharge Information Details Patient Name: [name] Date of Service: 01/14/2018 8:00 AM Medical Record Number: [id number] Patient Account Number: [id number] Date of Birth/Sex: 11/06/1945 (72 y.o. M) Treating RN: Renne Crigler Primary Care Kurstyn Larios: [name] Other Clinician: Referring Pax Reasoner: [name] Treating Hae Ahlers/Extender: Linwood Dibbles, HOYT Weeks in Treatment:  104 Encounter Discharge Information Items Discharge Condition: Stable Ambulatory Status: Ambulatory Discharge Destination: Home Transportation: Private Auto Schedule Follow-up Appointment: Yes Clinical Summary of Care: Electronic Signature(s) Signed: 01/15/2018 7:52:46 AM By: Renne Crigler Entered By: Renne Crigler on 01/14/2018 08:53:56 [name] ([id number]) -------------------------------------------------------------------------------- Lower Extremity Assessment Details Patient Name: [name] Date of Service: 01/14/2018 8:00 AM Medical Record Number: [id number] Patient Account Number: [id number] Date of Birth/Sex: [birth year] (72 y.o. M) Treating RN: Renne Crigler Primary Care Bryannah Boston: [name] Other Clinician: Referring Rossetta Kama: [name] Treating Lakeena Downie/Extender: STONE III, HOYT Weeks in Treatment: 104 Edema Assessment Assessed: [Left: No] [Right: No] Edema: [Left: N] [Right: o] Vascular Assessment Claudication: Claudication Assessment [Right:None] Pulses: Dorsalis Pedis Palpable: [Right:Yes] Posterior Tibial Extremity colors, hair growth, and conditions: Extremity Color: [Right:Hyperpigmented] Hair Growth on Extremity: [Right:No] Temperature of Extremity: [Right:Warm] Capillary Refill: [Right:< 3 seconds] Toe Nail Assessment Left: Right: Thick: Yes Discolored: Yes Deformed: Yes Improper Length and Hygiene: Yes Electronic Signature(s) Signed: 01/15/2018 7:52:46 AM By: Renne Crigler Entered By: Renne Crigler on 01/14/2018 08:20:51 [name] ([id number]) -------------------------------------------------------------------------------- Multi Wound Chart Details Patient Name: [name] Date of Service: 01/14/2018 8:00 AM Medical Record Number: [id number] Patient Account Number: [id number] Date of Birth/Sex: [birth year] (72 y.o. M) Treating RN: Huel Coventry Primary Care Marielis Samara: [name] Other  Clinician: Referring Muadh Creasy: [name] Treating Kindrick Lankford/Extender: Linwood Dibbles, HOYT Weeks in Treatment: 104 Vital Signs Height(in): 76 Pulse(bpm): 69 Weight(lbs): 238 Blood Pressure(mmHg): 146/74 Body Mass Index(BMI): 29 Temperature(F): 98.2 Respiratory Rate 16 (breaths/min): Photos: [1:No Photos] [2:No Photos] [N/A:N/A] Wound Location: [1:Right Malleolus - Lateral] [2:Right Malleolus - Medial] [N/A:N/A] Wounding Event: [1:Gradually Appeared] [2:Gradually Appeared] [N/A:N/A] Primary Etiology: [1:Venous Leg Ulcer] [2:Venous Leg Ulcer] [N/A:N/A] Comorbid History: [1:Cataracts, Chronic Obstructive Cataracts, Chronic Obstructive N/A Pulmonary Disease (COPD), Pulmonary Disease (COPD), Osteoarthritis] [2:Osteoarthritis] Date Acquired: [1:08/11/2015] [2:01/30/2016] [N/A:N/A] Weeks of Treatment: [1:104] [2:102] [N/A:N/A] Wound Status: [1:Open] [2:Open] [N/A:N/A] Clustered Wound: [1:No] [2:Yes] [N/A:N/A] Measurements L x W x D [1:5.8x6x0.2] [2:3.7x3.2x0.1] [N/A:N/A] (cm) Area (cm) : [1:27.332] [  2:9.299] [N/A:N/A] Volume (cm) : [1:5.466] [2:0.93] [N/A:N/A] % Reduction in Area: [1:-58.20%] [2:19.50%] [N/A:N/A] % Reduction in Volume: [1:-5.40%] [2:19.50%] [N/A:N/A] Classification: [1:Full Thickness Without Exposed Support Structures] [2:Full Thickness Without Exposed Support Structures] [N/A:N/A] Exudate Amount: [1:Large] [2:Large] [N/A:N/A] Exudate Type: [1:Serous] [2:Serous] [N/A:N/A] Exudate Color: [1:amber] [2:amber] [N/A:N/A] Wound Margin: [1:Distinct, outline attached] [2:Distinct, outline attached] [N/A:N/A] Granulation Amount: [1:Medium (34-66%)] [2:Medium (34-66%)] [N/A:N/A] Granulation Quality: [1:Red, Pink] [2:Red] [N/A:N/A] Necrotic Amount: [1:Medium (34-66%)] [2:Medium (34-66%)] [N/A:N/A] Exposed Structures: [1:Fat Layer (Subcutaneous Tissue) Exposed: Yes Fascia: No Tendon: No Muscle: No Joint: No Bone: No] [2:Fat Layer (Subcutaneous Tissue) Exposed: Yes Fascia: No  Tendon: No Muscle: No Joint: No Bone: No] [N/A:N/A] Epithelialization: [1:None] [2:None] [N/A:N/A] Periwound Skin Texture: [1:Excoriation: No Induration: No Callus: No] [2:Excoriation: No Induration: No Callus: No] [N/A:N/A] Crepitus: No Crepitus: No Rash: No Rash: No Scarring: No Scarring: No Periwound Skin Moisture: Maceration: No Maceration: No N/A Dry/Scaly: No Dry/Scaly: No Periwound Skin Color: Atrophie Blanche: No Atrophie Blanche: No N/A Cyanosis: No Cyanosis: No Ecchymosis: No Ecchymosis: No Erythema: No Erythema: No Hemosiderin Staining: No Hemosiderin Staining: No Mottled: No Mottled: No Pallor: No Pallor: No Rubor: No Rubor: No Temperature: No Abnormality N/A N/A Tenderness on Palpation: Yes No N/A Wound Preparation: Ulcer Cleansing: Ulcer Cleansing: N/A Rinsed/Irrigated with Saline Rinsed/Irrigated with Saline Topical Anesthetic Applied: Topical Anesthetic Applied: Other: lidocAINE 4% Other: LIDOCAINE 4% Treatment Notes Electronic Signature(s) Signed: 01/14/2018 5:25:47 PM By: Elliot Gurney, BSN, RN, CWS, Kim RN, BSN Entered By: Elliot Gurney, BSN, RN, CWS, Kim on 01/14/2018 08:30:17 [name] ([id number]) -------------------------------------------------------------------------------- Multi-Disciplinary Care Plan Details Patient Name: [name] Date of Service: 01/14/2018 8:00 AM Medical Record Number: [id number] Patient Account Number: [id number] Date of Birth/Sex: 29-Jan-1946 (72 y.o. M) Treating RN: Huel Coventry Primary Care Jamiyla Ishee: [name] Other Clinician: Referring Kenedee Molesky: [name] Treating Adine Heimann/Extender: Linwood Dibbles, HOYT Weeks in Treatment: 104 Active Inactive ` Venous Leg Ulcer Nursing Diagnoses: Knowledge deficit related to disease process and management Goals: Patient will maintain optimal edema control Date Initiated: 04/03/2016 Target Resolution Date: 11/24/2016 Goal Status: Active Interventions: Compression as  ordered Treatment Activities: Therapeutic compression applied : 04/03/2016 Notes: ` Wound/Skin Impairment Nursing Diagnoses: Impaired tissue integrity Goals: Ulcer/skin breakdown will heal within 14 weeks Date Initiated: 01/17/2016 Target Resolution Date: 11/24/2016 Goal Status: Active Interventions: Assess ulceration(s) every visit Notes: Electronic Signature(s) Signed: 01/14/2018 5:25:47 PM By: Elliot Gurney, BSN, RN, CWS, Kim RN, BSN Entered By: Elliot Gurney, BSN, RN, CWS, Kim on 01/14/2018 08:30:07 [name] ([id number]) -------------------------------------------------------------------------------- Pain Assessment Details Patient Name: [name] Date of Service: 01/14/2018 8:00 AM Medical Record Number: [id number] Patient Account Number: [id number] Date of Birth/Sex: 06/19/1946 (72 y.o. M) Treating RN: Renne Crigler Primary Care Michaeal Davis: [name] Other Clinician: Referring Jennifer Holland: [name] Treating Aisling Emigh/Extender: STONE III, HOYT Weeks in Treatment: 104 Active Problems Location of Pain Severity and Description of Pain Patient Has Paino No Site Locations Pain Management and Medication Current Pain Management: Electronic Signature(s) Signed: 01/15/2018 7:52:46 AM By: Renne Crigler Entered By: Renne Crigler on 01/14/2018 08:13:06 [name] ([id number]) -------------------------------------------------------------------------------- Patient/Caregiver Education Details Patient Name: [name] Date of Service: 01/14/2018 8:00 AM Medical Record Number: [id number] Patient Account Number: [id number] Date of Birth/Gender: Jun 15, 1946 (72 y.o. M) Treating RN: Renne Crigler Primary Care Physician: [name] Other Clinician: Referring Physician: [name] Treating Physician/Extender: Skeet Simmer in Treatment: 104 Education Assessment Education Provided To: Patient Education Topics Provided Wound  Debridement: Handouts: Wound Debridement Methods: Explain/Verbal Responses: State content correctly Wound/Skin  Impairment: Handouts: Caring for Your Ulcer Methods: Explain/Verbal Responses: State content correctly Electronic Signature(s) Signed: 01/15/2018 7:52:46 AM By: Renne Crigler Entered By: Renne Crigler on 01/14/2018 08:54:13 [name] ([id number]) -------------------------------------------------------------------------------- Wound Assessment Details Patient Name: [name] Date of Service: 01/14/2018 8:00 AM Medical Record Number: [id number] Patient Account Number: [id number] Date of Birth/Sex: 25-Jan-1946 (72 y.o. M) Treating RN: Renne Crigler Primary Care Sarahgrace Broman: [name] Other Clinician: Referring Keghan Mcfarren: [name] Treating Gennett Garcia/Extender: STONE III, HOYT Weeks in Treatment: 104 Wound Status Wound Number: 1 Primary Venous Leg Ulcer Etiology: Wound Location: Right Malleolus - Lateral Wound Open Wounding Event: Gradually Appeared Status: Date Acquired: 08/11/2015 Comorbid Cataracts, Chronic Obstructive Pulmonary Weeks Of Treatment: 104 History: Disease (COPD), Osteoarthritis Clustered Wound: No Photos Photo Uploaded By: Renne Crigler on 01/15/2018 16:07:09 Wound Measurements Length: (cm) 5.8 Width: (cm) 6 Depth: (cm) 0.2 Area: (cm) 27.332 Volume: (cm) 5.466 % Reduction in Area: -58.2% % Reduction in Volume: -5.4% Epithelialization: None Tunneling: No Undermining: No Wound Description Full Thickness Without Exposed Support Classification: Structures Wound Margin: Distinct, outline attached Exudate Large Amount: Exudate Type: Serous Exudate Color: amber Foul Odor After Cleansing: No Slough/Fibrino Yes Wound Bed Granulation Amount: Medium (34-66%) Exposed Structure Granulation Quality: Red, Pink Fascia Exposed: No Necrotic Amount: Medium (34-66%) Fat Layer (Subcutaneous Tissue) Exposed:  Yes Necrotic Quality: Adherent Slough Tendon Exposed: No Muscle Exposed: No Joint Exposed: No Bone Exposed: No Trefry, Eh C. ([id number]) Periwound Skin Texture Texture Color No Abnormalities Noted: No No Abnormalities Noted: No Callus: No Atrophie Blanche: No Crepitus: No Cyanosis: No Excoriation: No Ecchymosis: No Induration: No Erythema: No Rash: No Hemosiderin Staining: No Scarring: No Mottled: No Pallor: No Moisture Rubor: No No Abnormalities Noted: No Dry / Scaly: No Temperature / Pain Maceration: No Temperature: No Abnormality Tenderness on Palpation: Yes Wound Preparation Ulcer Cleansing: Rinsed/Irrigated with Saline Topical Anesthetic Applied: Other: lidocAINE 4%, Treatment Notes Wound #1 (Right, Lateral Malleolus) 1. Cleansed with: Clean wound with Normal Saline 2. Anesthetic Topical Lidocaine 4% cream to wound bed prior to debridement 3. Peri-wound Care: Other peri-wound care (specify in notes) 4. Dressing Applied: Other dressing (specify in notes) 5. Secondary Dressing Applied ABD Pad Notes gentamycin cream on wound bed, cover with silver cell, xtrasorb, abd and kerlix wrap with tape Electronic Signature(s) Signed: 01/15/2018 7:52:46 AM By: Renne Crigler Entered By: Renne Crigler on 01/14/2018 08:22:50 [name] ([id number]) -------------------------------------------------------------------------------- Wound Assessment Details Patient Name: [name] Date of Service: 01/14/2018 8:00 AM Medical Record Number: [id number] Patient Account Number: [id number] Date of Birth/Sex: 1945-11-19 (72 y.o. M) Treating RN: Renne Crigler Primary Care Luz Burcher: [name] Other Clinician: Referring Hansel Devan: [name] Treating Aston Lieske/Extender: STONE III, HOYT Weeks in Treatment: 104 Wound Status Wound Number: 2 Primary Venous Leg Ulcer Etiology: Wound Location: Right Malleolus - Medial Wound Open Wounding Event:  Gradually Appeared Status: Date Acquired: 01/30/2016 Comorbid Cataracts, Chronic Obstructive Pulmonary Weeks Of Treatment: 102 History: Disease (COPD), Osteoarthritis Clustered Wound: Yes Photos Photo Uploaded By: Renne Crigler on 01/15/2018 16:07:09 Wound Measurements Length: (cm) 3.7 Width: (cm) 3.2 Depth: (cm) 0.1 Area: (cm) 9.299 Volume: (cm) 0.93 % Reduction in Area: 19.5% % Reduction in Volume: 19.5% Epithelialization: None Tunneling: No Undermining: No Wound Description Full Thickness Without Exposed Support Classification: Structures Wound Margin: Distinct, outline attached Exudate Large Amount: Exudate Type: Serous Exudate Color: amber Foul Odor After Cleansing: No Slough/Fibrino Yes Wound Bed Granulation Amount: Medium (34-66%) Exposed Structure Granulation Quality: Red Fascia Exposed: No Necrotic Amount: Medium (34-66%) Fat Layer (Subcutaneous  Tissue) Exposed: Yes Necrotic Quality: Adherent Slough Tendon Exposed: No Muscle Exposed: No Joint Exposed: No Bone Exposed: No Benko, Corrin C. ([id number]) Periwound Skin Texture Texture Color No Abnormalities Noted: No No Abnormalities Noted: No Callus: No Atrophie Blanche: No Crepitus: No Cyanosis: No Excoriation: No Ecchymosis: No Induration: No Erythema: No Rash: No Hemosiderin Staining: No Scarring: No Mottled: No Pallor: No Moisture Rubor: No No Abnormalities Noted: No Dry / Scaly: No Maceration: No Wound Preparation Ulcer Cleansing: Rinsed/Irrigated with Saline Topical Anesthetic Applied: Other: LIDOCAINE 4%, Treatment Notes Wound #2 (Right, Medial Malleolus) 1. Cleansed with: Clean wound with Normal Saline 2. Anesthetic Topical Lidocaine 4% cream to wound bed prior to debridement 3. Peri-wound Care: Other peri-wound care (specify in notes) 4. Dressing Applied: Other dressing (specify in notes) 5. Secondary Dressing Applied ABD Pad Notes gentamycin cream on wound bed,  cover with silver cell, xtrasorb, abd and kerlix wrap with tape Electronic Signature(s) Signed: 01/15/2018 7:52:46 AM By: Renne Crigler Entered By: Renne Crigler on 01/14/2018 08:23:29 [name] ([id number]) -------------------------------------------------------------------------------- Vitals Details Patient Name: [name] Date of Service: 01/14/2018 8:00 AM Medical Record Number: [id number] Patient Account Number: [id number] Date of Birth/Sex: 1946-08-05 (72 y.o. M) Treating RN: Renne Crigler Primary Care Kristyl Athens: [name] Other Clinician: Referring Brighten Buzzelli: [name] Treating Trent Gabler/Extender: STONE III, HOYT Weeks in Treatment: 104 Vital Signs Time Taken: 08:13 Temperature (F): 98.2 Height (in): 76 Pulse (bpm): 69 Weight (lbs): 238 Respiratory Rate (breaths/min): 16 Body Mass Index (BMI): 29 Blood Pressure (mmHg): 146/74 Reference Range: 80 - 120 mg / dl Electronic Signature(s) Signed: 01/15/2018 7:52:46 AM By: Renne Crigler Entered By: Renne Crigler on 01/14/2018 08:13:26

## 2018-01-21 ENCOUNTER — Encounter: Payer: Medicare HMO | Admitting: Physician Assistant

## 2018-01-21 DIAGNOSIS — I87331 Chronic venous hypertension (idiopathic) with ulcer and inflammation of right lower extremity: Secondary | ICD-10-CM | POA: Diagnosis not present

## 2018-01-22 NOTE — Progress Notes (Signed)
[name], [name] ([id number]) Visit Report for 12/31/2017 Arrival Information Details Patient Name: [name], [name]. Date of Service: 12/31/2017 8:30 AM Medical Record Number: [id number] Patient Account Number: [id number] Date of Birth/Sex: [birth year] (72 y.o. M) Treating RN: Huel Coventry Primary Care Garrie Elenes: [name] Other Clinician: Referring Deborra Phegley: [name] Treating Evani Shrider/Extender: Linwood Dibbles, HOYT Weeks in Treatment: 102 Visit Information History Since Last Visit Added or deleted any medications: No Patient Arrived: Ambulatory Any new allergies or adverse reactions: No Arrival Time: 08:30 Had a fall or experienced change in No Accompanied By: self activities of daily living that may affect Transfer Assistance: Manual risk of falls: Patient Identification Verified: Yes Signs or symptoms of abuse/neglect since last visito No Secondary Verification Process Yes Hospitalized since last visit: No Completed: Implantable device outside of the clinic excluding No Patient Requires Transmission-Based No cellular tissue based products placed in the center Precautions: since last visit: Patient Has Alerts: Yes Has Dressing in Place as Prescribed: Yes Patient Alerts: Patient on Blood Pain Present Now: No Thinner Electronic Signature(s) Signed: 01/01/2018 5:47:31 PM By: [name], BSN, RN, CWS, Kim RN, BSN Entered By: [name], BSN, RN, CWS, [name] on 12/31/2017 08:31:14 [name] ([id number]) -------------------------------------------------------------------------------- Encounter Discharge Information Details Patient Name: [name] Date of Service: 12/31/2017 8:30 AM Medical Record Number: [id number] Patient Account Number: [id number] Date of Birth/Sex: 1945/10/11 (72 y.o. M) Treating RN: [name] Primary Care Kiasia Chou: [name] Other Clinician: Referring Rose-Marie Hickling: [name] Treating Sheyann Sulton/Extender: Linwood Dibbles, HOYT Weeks in Treatment:  102 Encounter Discharge Information Items Discharge Condition: Stable Ambulatory Status: Ambulatory Discharge Destination: Home Transportation: Private Auto Accompanied By: self Schedule Follow-up Appointment: Yes Clinical Summary of Care: Patient Declined Electronic Signature(s) Signed: 01/22/2018 7:45:54 AM By: [name] Entered By: [name] on 12/31/2017 09:58:36 [name] ([id number]) -------------------------------------------------------------------------------- Lower Extremity Assessment Details Patient Name: [name] Date of Service: 12/31/2017 8:30 AM Medical Record Number: [id number] Patient Account Number: [id number] Date of Birth/Sex: [birth year] (72 y.o. M) Treating RN: Huel Coventry Primary Care Lamiya Naas: [name] Other Clinician: Referring Bienvenido Proehl: [name] Treating Echo Allsbrook/Extender: Linwood Dibbles, HOYT Weeks in Treatment: 102 Vascular Assessment Claudication: Claudication Assessment [Right:None] Pulses: Dorsalis Pedis Palpable: [Right:Yes] Posterior Tibial Extremity colors, hair growth, and conditions: Extremity Color: [Right:Hyperpigmented] Hair Growth on Extremity: [Right:No] Temperature of Extremity: [Right:Warm] Capillary Refill: [Right:< 3 seconds] Toe Nail Assessment Left: Right: Thick: Yes Discolored: Yes Deformed: Yes Improper Length and Hygiene: Yes Electronic Signature(s) Signed: 01/01/2018 5:47:31 PM By: [name], BSN, RN, CWS, Kim RN, BSN Entered By: [name], BSN, RN, CWS, [name] on 12/31/2017 08:42:15 [name] ([id number]) -------------------------------------------------------------------------------- Multi Wound Chart Details Patient Name: [name] Date of Service: 12/31/2017 8:30 AM Medical Record Number: [id number] Patient Account Number: [id number] Date of Birth/Sex: [birth year] (72 y.o. M) Treating RN: [name] Primary Care Chasity Outten: [name] Other Clinician: Referring Ghina Bittinger:  [name] Treating Brycelyn Gambino/Extender: STONE III, HOYT Weeks in Treatment: 102 Vital Signs Height(in): 76 Pulse(bpm): 70 Weight(lbs): 238 Blood Pressure(mmHg): 140/65 Body Mass Index(BMI): 29 Temperature(F): 98.6 Respiratory Rate 16 (breaths/min): Photos: Wound Location: Right, Lateral Malleolus Right, Medial Malleolus Right Lower Leg - Midline Wounding Event: Gradually Appeared Gradually Appeared Gradually Appeared Primary Etiology: Venous Leg Ulcer Venous Leg Ulcer Venous Leg Ulcer Comorbid History: N/A N/A Cataracts, Chronic Obstructive Pulmonary Disease (COPD), Osteoarthritis Date Acquired: 08/11/2015 01/30/2016 12/17/2017 Weeks of Treatment: 102 100 2 Wound Status: Open Open Open Clustered Wound: No Yes Yes Clustered Quantity: N/A N/A 2 Measurements L x W x D 5.5x5x0.2  4x3.5x0.1 1x0.5x0.1 (cm) Area (cm) : 21.598 10.996 0.393 Volume (cm) : 4.32 1.1 0.039 % Reduction in Area: -25.00% 4.80% 66.60% % Reduction in Volume: 16.70% 4.80% 66.90% Classification: Full Thickness Without Full Thickness Without Unclassifiable Exposed Support Structures Exposed Support Structures Exudate Amount: N/A N/A None Present Wound Margin: N/A N/A Flat and Intact Granulation Amount: N/A N/A None Present (0%) Necrotic Amount: N/A N/A Large (67-100%) Necrotic Tissue: N/A N/A Eschar Epithelialization: N/A N/A None Periwound Skin Texture: No Abnormalities Noted No Abnormalities Noted Excoriation: No Induration: No Callus: No Crepitus: No [name], [name] ([id number]) Rash: No Scarring: No Periwound Skin Moisture: No Abnormalities Noted No Abnormalities Noted Maceration: No Dry/Scaly: No Periwound Skin Color: No Abnormalities Noted No Abnormalities Noted Atrophie Blanche: No Cyanosis: No Ecchymosis: No Erythema: No Hemosiderin Staining: No Mottled: No Pallor: No Rubor: No Temperature: N/A N/A No Abnormality Tenderness on Palpation: No No Yes Wound Preparation: N/A  N/A Ulcer Cleansing: Rinsed/Irrigated with Saline Topical Anesthetic Applied: Other: lidocaine 4% Treatment Notes Electronic Signature(s) Signed: 12/31/2017 5:11:14 PM By: Alejandro Mulling Entered By: Alejandro Mulling on 12/31/2017 09:39:05 [name] ([id number]) -------------------------------------------------------------------------------- Multi-Disciplinary Care Plan Details Patient Name: [name] Date of Service: 12/31/2017 8:30 AM Medical Record Number: [id number] Patient Account Number: [id number] Date of Birth/Sex: 12/08/1945 (72 y.o. M) Treating RN: [name] Primary Care Errika Narvaiz: [name] Other Clinician: Referring Rhylen Shaheen: [name] Treating Rayetta Veith/Extender: STONE III, HOYT Weeks in Treatment: 102 Active Inactive ` Venous Leg Ulcer Nursing Diagnoses: Knowledge deficit related to disease process and management Goals: Patient will maintain optimal edema control Date Initiated: 04/03/2016 Target Resolution Date: 11/24/2016 Goal Status: Active Interventions: Compression as ordered Treatment Activities: Therapeutic compression applied : 04/03/2016 Notes: ` Wound/Skin Impairment Nursing Diagnoses: Impaired tissue integrity Goals: Ulcer/skin breakdown will heal within 14 weeks Date Initiated: 01/17/2016 Target Resolution Date: 11/24/2016 Goal Status: Active Interventions: Assess ulceration(s) every visit Notes: Electronic Signature(s) Signed: 12/31/2017 5:11:14 PM By: Alejandro Mulling Entered By: Alejandro Mulling on 12/31/2017 09:38:51 [name] ([id number]) -------------------------------------------------------------------------------- Pain Assessment Details Patient Name: [name] Date of Service: 12/31/2017 8:30 AM Medical Record Number: [id number] Patient Account Number: [id number] Date of Birth/Sex: 05-08-1946 (72 y.o. M) Treating RN: Huel Coventry Primary Care Rhen Dossantos: [name] Other  Clinician: Referring Rosibel Giacobbe: [name] Treating Taylon Coole/Extender: Linwood Dibbles, HOYT Weeks in Treatment: 102 Active Problems Location of Pain Severity and Description of Pain Patient Has Paino No Site Locations Pain Management and Medication Current Pain Management: Goals for Pain Management Topical or injectable lidocaine is offered to patient for acute pain when surgical debridement is performed. If needed, Patient is instructed to use over the counter pain medication for the following 24-48 hours after debridement. Wound care MDs do not prescribed pain medications. Patient has chronic pain or uncontrolled pain. Patient has been instructed to make an appointment with their Primary Care Physician for pain management. Electronic Signature(s) Signed: 01/01/2018 5:47:31 PM By: [name], BSN, RN, CWS, Kim RN, BSN Entered By: [name], BSN, RN, CWS, [name] on 12/31/2017 08:31:28 [name] ([id number]) -------------------------------------------------------------------------------- Patient/Caregiver Education Details Patient Name: [name] Date of Service: 12/31/2017 8:30 AM Medical Record Number: [id number] Patient Account Number: [id number] Date of Birth/Gender: [birth year] (72 y.o. M) Treating RN: [name] Primary Care Physician: [name] Other Clinician: Referring Physician: [name] Treating Physician/Extender: Linwood Dibbles, HOYT Weeks in Treatment: 102 Education Assessment Education Provided To: Patient Education Topics Provided Wound Debridement: Handouts: Wound Debridement Methods: Explain/Verbal Responses: State content correctly Wound/Skin Impairment: Handouts: Caring for Your  Ulcer Methods: Explain/Verbal Responses: State content correctly Electronic Signature(s) Signed: 01/22/2018 7:45:54 AM By: [name] Entered By: [name] on 12/31/2017 09:59:14 [name]  ([id number]) -------------------------------------------------------------------------------- Wound Assessment Details Patient Name: [name] Date of Service: 12/31/2017 8:30 AM Medical Record Number: [id number] Patient Account Number: [id number] Date of Birth/Sex: 1946/07/21 (72 y.o. M) Treating RN: Huel Coventry Primary Care Fatoumata Albaugh: [name] Other Clinician: Referring Denesha Brouse: [name] Treating Laia Wiley/Extender: STONE III, HOYT Weeks in Treatment: 102 Wound Status Wound Number: 1 Primary Etiology: Venous Leg Ulcer Wound Location: Right, Lateral Malleolus Wound Status: Open Wounding Event: Gradually Appeared Date Acquired: 08/11/2015 Weeks Of Treatment: 102 Clustered Wound: No Photos Photo Uploaded By: [name], BSN, RN, CWS, [name] on 12/31/2017 09:02:57 Wound Measurements Length: (cm) 5.5 Width: (cm) 5 Depth: (cm) 0.2 Area: (cm) 21.598 Volume: (cm) 4.32 % Reduction in Area: -25% % Reduction in Volume: 16.7% Wound Description Full Thickness Without Exposed Support Classification: Structures Periwound Skin Texture Texture Color No Abnormalities Noted: No No Abnormalities Noted: No Moisture No Abnormalities Noted: No Electronic Signature(s) Signed: 01/01/2018 5:47:31 PM By: [name], BSN, RN, CWS, Kim RN, BSN Entered By: [name], BSN, RN, CWS, [name] on 12/31/2017 08:38:08 [name] ([id number]) -------------------------------------------------------------------------------- Wound Assessment Details Patient Name: [name] Date of Service: 12/31/2017 8:30 AM Medical Record Number: [id number] Patient Account Number: [id number] Date of Birth/Sex: 07-29-1946 (72 y.o. M) Treating RN: Huel Coventry Primary Care Israel Wunder: [name] Other Clinician: Referring Grafton Warzecha: [name] Treating Nayelis Bonito/Extender: STONE III, HOYT Weeks in Treatment: 102 Wound Status Wound Number: 2 Primary Etiology: Venous Leg Ulcer Wound Location: Right, Medial  Malleolus Wound Status: Open Wounding Event: Gradually Appeared Date Acquired: 01/30/2016 Weeks Of Treatment: 100 Clustered Wound: Yes Photos Photo Uploaded By: [name], BSN, RN, CWS, [name] on 12/31/2017 09:02:57 Wound Measurements Length: (cm) 4 Width: (cm) 3.5 Depth: (cm) 0.1 Area: (cm) 10.996 Volume: (cm) 1.1 % Reduction in Area: 4.8% % Reduction in Volume: 4.8% Wound Description Full Thickness Without Exposed Support Classification: Structures Periwound Skin Texture Texture Color No Abnormalities Noted: No No Abnormalities Noted: No Moisture No Abnormalities Noted: No Electronic Signature(s) Signed: 01/01/2018 5:47:31 PM By: [name], BSN, RN, CWS, Kim RN, BSN Entered By: [name], BSN, RN, CWS, [name] on 12/31/2017 08:38:08 [name] ([id number]) -------------------------------------------------------------------------------- Wound Assessment Details Patient Name: [name] Date of Service: 12/31/2017 8:30 AM Medical Record Number: [id number] Patient Account Number: [id number] Date of Birth/Sex: 1946/07/25 (72 y.o. M) Treating RN: Huel Coventry Primary Care Niranjan Rufener: [name] Other Clinician: Referring Caprina Wussow: [name] Treating Talajah Slimp/Extender: STONE III, HOYT Weeks in Treatment: 102 Wound Status Wound Number: 4 Primary Venous Leg Ulcer Etiology: Wound Location: Right Lower Leg - Midline Wound Open Wounding Event: Gradually Appeared Status: Date Acquired: 12/17/2017 Comorbid Cataracts, Chronic Obstructive Pulmonary Weeks Of Treatment: 2 History: Disease (COPD), Osteoarthritis Clustered Wound: Yes Photos Photo Uploaded By: [name], BSN, RN, CWS, [name] on 12/31/2017 09:03:32 Wound Measurements Length: (cm) 1 % Reduct Width: (cm) 0.5 % Reduct Depth: (cm) 0.1 Epitheli Clustered Quantity: 2 Tunnelin Area: (cm) 0.393 Undermi Volume: (cm) 0.039 ion in Area: 66.6% ion in Volume: 66.9% alization: None g: No ning: No Wound  Description Classification: Unclassifiable Foul Odo Wound Margin: Flat and Intact Slough/F Exudate Amount: None Present r After Cleansing: No ibrino No Wound Bed Granulation Amount: None Present (0%) Exposed Structure Necrotic Amount: Large (67-100%) Fascia Exposed: No Necrotic Quality: Eschar Fat Layer (Subcutaneous Tissue) Exposed: No Tendon Exposed: No Muscle Exposed: No Joint Exposed: No Bone Exposed: No Limited  to Skin Breakdown Periwound Skin Texture Palen, Rafan C. ([id number]) Texture Color No Abnormalities Noted: No No Abnormalities Noted: No Callus: No Atrophie Blanche: No Crepitus: No Cyanosis: No Excoriation: No Ecchymosis: No Induration: No Erythema: No Rash: No Hemosiderin Staining: No Scarring: No Mottled: No Pallor: No Moisture Rubor: No No Abnormalities Noted: No Dry / Scaly: No Temperature / Pain Maceration: No Temperature: No Abnormality Tenderness on Palpation: Yes Wound Preparation Ulcer Cleansing: Rinsed/Irrigated with Saline Topical Anesthetic Applied: Other: lidocaine 4%, Electronic Signature(s) Signed: 01/01/2018 5:47:31 PM By: [name], BSN, RN, CWS, Kim RN, BSN Entered By: [name], BSN, RN, CWS, [name] on 12/31/2017 08:39:06 [name] ([id number]) -------------------------------------------------------------------------------- Vitals Details Patient Name: [name] Date of Service: 12/31/2017 8:30 AM Medical Record Number: [id number] Patient Account Number: [id number] Date of Birth/Sex: 1945/11/24 (72 y.o. M) Treating RN: Huel Coventry Primary Care Dayshon Roback: [name] Other Clinician: Referring Tank Difiore: [name] Treating Grethel Zenk/Extender: STONE III, HOYT Weeks in Treatment: 102 Vital Signs Time Taken: 08:31 Temperature (F): 98.6 Height (in): 76 Pulse (bpm): 70 Weight (lbs): 238 Respiratory Rate (breaths/min): 16 Body Mass Index (BMI): 29 Blood Pressure (mmHg): 140/65 Reference Range: 80 - 120 mg /  dl Electronic Signature(s) Signed: 01/01/2018 5:47:31 PM By: [name], BSN, RN, CWS, Kim RN, BSN Entered By: [name], BSN, RN, CWS, [name] on 12/31/2017 08:31:54

## 2018-01-26 NOTE — Progress Notes (Signed)
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 compression wrap in which  she tolerates very well at this point. He is continuing to utilize  the Hydrofera Blue dressing. 06/12/16 I think there has been some progression in the status of both of these wounds over today again covered in a gelatinous surface. Has been using Hydrofera Blue. We had used Iodoflex in the past I'm not sure if there was an issue other than changing to something that might progress towards closure faster 06/19/16; he did not tolerate the Flexeril last week secondary to pain and this was changed on Friday back to [facility] area he continues to have copious amounts of gelatinous surface slough which is think inhibiting the speed of healing this area 06/26/16 patient over the last week has utilized the Santyl to try to loosen up some of the tightly adherent slough that was noted on evaluation last week. The good news is he tells me that the medial malleoli region really does not bother him the right llateral malleoli region is more tender to palpation at this point in time especially in the central/inferior location. However it does appear that the Santyl has done his job to loosen up the adherent slough at this point in time. Fortunately he has no interval signs or symptoms of infection locally or systemically no purulent discharge noted. 07/03/16 at this point in time today patient's wounds appear to be significantly improved over the right medial and lateral malleolus locations. He has much less tenderness at this point in time and the wounds appear clean her although there is still adherent slough this is sufficiently improved over what I saw last week. I still see no evidence of local infection. 07/10/16; continued gradual improvement in the right medial and lateral malleolus locations. The lateral is more substantial wound now divided into 2 by a rim of normal epithelialization. Both areas have adherent surface slough and nonviable subcutaneous tissue 07-17-16- He continues to have progress  to his right medial and lateral malleolus ulcers. He denies any complaints of pain or intolerance to compression. Both ulcers are smaller in size oriented today's measurements, both are covered with a softly adherent slough. 07/24/16; medial wound is smaller, lateral about the same although surface looks better. Still using Hydrofera Blue 07/31/16; arrives today complaining of pain in the lateral part of his foot. Nurse reports a lot more drainage. He has been using Hydrofera Blue. Switch to silver alginate today 08/03/2016 -- I was asked to see the patient was here for a nurse visit today. I understand he had a lot of pain in his right lower extremity and was having blisters on his right foot which have not been there before. Though he started on doxycycline he does not have blisters elsewhere on his body. I do not believe this is a drug allergy. also mentioned that there was a copious purulent discharge from the wound and clinically there is no evidence of cellulitis. 08/07/16; I note that the patient came in for his nurse check on Friday apparently with blisters on his toes on the right than a lot of swelling in his forefoot. He continued on the doxycycline that I had prescribed on 12/8. A culture was done of the lateral wound that showed a combination of a few Proteus and Pseudomonas. Doxycycline might of covered the Proteus but would be unlikely to cover the Pseudomonas. He is on Coumadin. He arrives in the clinic today feeling a lot better states the pain is a lot better but nothing specific really was done other than to rewrap the foot also noted that he had arterial studies ordered in August although these  were never done. It is reasonable to go ahead and reorder these. 08/14/16; generally arrives in a better state today in terms of the wounds he has taken cefdinir for one week. Our intake nurse reports copious amounts of drainage but the patient is complaining of much less pain. He is not  had his PT and INR checked and I've asked him to do this today or tomorrow. 08/24/2016 -- patient arrives today after 10 days and said he had a stomach upset. His arterial study was done and I have reviewed this report and find it to be within normal limits. However I did not note any venous duplex studies for reflux, and Dr. Leanord Hawking may have ordered these in the past but I will leave it to him to decide if he needs these. The patient has finished his course of cefdinir. 08/28/16; patient arrives today again with copious amounts of thick really green drainage for our intake nurse. He states he has a very tender spot at the superior part of the lateral wound. Wounds are larger 09/04/16; no real change in the condition of this patient's wound still copious amounts of surface slough. Started him on Iodoflex last week he is completing another course of Cefdinir or which I think was done empirically. His arterial study showed ABIs were 1.1 on the right 1.5 on the left. He did have a slightly reduced ABI in the right the left one was not obtained. Had calcification of the right posterior tibial artery. The interpretation was no segmental stenosis. His waveforms were triphasic. DANZIG, MACGREGOR ([id number]) His reflux studies are later this month. Depending on this I'll send him for a vascular consultation, he may need to see plastic surgery as I believe he is had plastic surgery on this foot in the past. He had an injury to the foot in the 1980s. 1/16 /18 right lateral greater than right medial ankle wounds on the right in the setting of previous skin grafting. 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 He does not have arterial issues. Each week he comes in with the same adherent surface slough although there was less of this today 09/18/16; right lateral greater than right medial lower extremity wounds in the setting of  previous skin grafting and trauma. He has least to vein laser ablation scheduled for February 2 for venous reflux. He does not have significant arterial disease. Problem has been very difficult to handle surface slough/necrotic tissue. Recently using Iodoflex for this with some, albeit slow improvement 09/25/16; right lateral greater than right medial lower extremity venous wounds in the setting of previous skin grafting. He is going for ablation surgery on February 2 after this he'll come back here for rewrap. He has been using Iodoflex as the primary dressing. 10/02/16; right lateral greater than right medial lower extremity wounds in the setting of previous skin grafting. He had his ablation surgery last week, I don't have a report. He tolerated this well. Came in with a thigh-high Unna boots on Friday. We have been using Iodoflex as the primary dressing. His measurements are improving 10/09/16; continues to make nice aggressive in terms of the wounds on his lateral and medial right ankle in the setting of previous skin grafting. Yesterday he noticed drainage at one of his surgical sites from his venous ablation on the right calf. He took off the bandage over this area felt a "  popping" sensation and a reddish-brown drainage. He is not complaining of any pain 10/16/16; he continues to make nice progression in terms of the wounds on the lateral and medial malleolus. Both smaller using Iodoflex. He had a surgical area in his posterior mid calf we have been using iodoform. All the wounds are down and dimensions 10/23/16; the patient arrives today with no complaints. He states the Iodoflex is a bit uncomfortable. He is not systemically unwell. We have been using Iodoflex to the lateral right ankle and the medial and Aquacel Ag to the reflux surgical wound on the posterior right calf. All of these wounds are doing well 10/30/16; patient states he has no pain no systemic symptoms. I changed him to [facility] last week. Although the wounds are doing well 11/06/16; patient reports no pain or systemic symptoms. We continue with Hydrofera Blue. Both wound areas on the medial and lateral ankle appear to be doing well with improvement and dimensions and improvement in the wound bed. 11/13/16; patient's dimensions continued to improve. We continue with Hydrofera Blue on the medial and lateral side. Appear to be doing well with healthy granulation and advancing epithelialization 11/20/16; patient's dimensions improving laterally by about half a centimeter in length. Otherwise no change on the medial side. Using [facility] 12/04/16; no major change in patient's wound dimensions. Intake nurse reports more drainage. 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 He is voicing no complaints or concerns. He has been tolerating his twice weekly compression therapy changes 12/11/16 Patient complains of pain and increased drainage.. wants hydrofera blue 12/18/16 improvement. Sorbact 12/25/16; medial wound is smaller, lateral measures the same. Still on sorbact 01/01/17; medial wound continues to be smaller, lateral measures about the same however there is clearly advancing epithelialization here as well in fact I think the wound will ultimately divided into 2 open areas 01/08/17; unfortunately today fairly significant regression in several areas. Surface of the lateral wound covered again in adherent necrotic material which is difficult to debridement. He has significant surrounding skin maceration. The expanding area of tissue epithelialization in the middle of the wound that was encouraging last week appears to be smaller. There is no surrounding tenderness. The area on the medial leg also did not seem to be as healthy as last week, the reason for this regression this week is not totally clear. We have been using  Sorbact for the last 4 weeks. We'll switched of polymen AG which we will order via home medical supply. If there is a problem with this would switch back to Iodoflex 01/15/18; drainage,odor. No change. Switched to polymen last week 01/22/17; still continuous drainage. Culture I did last week showed a few Proteus pansensitive. I did this culture because of drainage. Put him on Augmentin which she has been taking since Saturday however he is developed 4-5 liquid bowel movements. He is also on Coumadin. Beyond this wound is not changed at all, still nonviable necrotic surface material which I debrided reveals healthy granulation line 01/29/17; still copious amounts of drainage reported by her intake nurse. Wound measuring slightly smaller. Currently fact on Iodoflex although I'm looking forward to changing back to perhaps [facility] or polymen AG 02/05/17; still large amounts of drainage and presenting with really large amounts of adherent slough and necrotic material over the remaining open area of the wound. We have been using Iodoflex but with little improvement in  the surface. Change to [facility] 02/12/17; still large amount of drainage. Much less adherent slough however. Started [employer] [place] last week VERLYN, LAMBERT ([id number]) 02/19/17; drainage is better this week. Much less adherent slough. Perhaps some improvement in dimensions. Using [facility] 02/26/17; severe venous insufficiency wounds on the right lateral and right medial leg. Drainage is some better and slough is less adherent we've been using Hydrofera Blue 03/05/17 on evaluation today patient appears to be doing well. His wounds have been decreasing in size and overall he is pleased with how this is progressing. We are awaiting approval for the epigraph which has previously been recommended in the meantime the [facility] Dressing is to be doing very well for him. 03/12/17; wound dimensions are smaller still using  Hydrofera Blue. Comes in on Fridays for a dressing change. 03/19/17; wound dimensions continued to contract. Healing of this wound is complicated by continuous significant drainage as well as recurrent buildup of necrotic surface material. We looked into Apligraf and he has a $290 co-pay per application but truthfully I think the drainage as well as the nonviable surface would preclude use of Apligraf there are any other skin substitute at this point therefore the continued plan will be debridement each clinic visit, 2 times a week dressing changes and continued use of Hydrofera Blue. improvement has been very slow but sustained 03/26/17 perhaps slight improvements in peripheral epithelialization is especially inferiorly. Still with large amount of drainage and tightly adherent necrotic surface on arrival. Along with the intake nurse I reviewed previous treatment. He worsened on Iodoflex and had a 4 week trial of sorbact, Polymen AG and a long courses of Aquacel Ag. He is not a candidate for advanced treatment options for many reasons 04/09/17 on evaluation today patient's right lower extremity wounds appear to be doing a little better. Fortunately he has no significant discomfort and has been tolerating the dressing changes including the wraps without complication. With that being said he really does not have swelling anymore compared to what he has had in the past following his vascular intervention. He wonders if potentially we could attempt avoiding the rats to see if he could cleanse the wound in between to try to prevent some of the fiber and its buildup that occurs in the interim between when we see him week to week. No fevers, chills, nausea, or vomiting noted at this time. 04/16/17; noted that the staff made a choice last week not to put him in compression. The patient is changing his dressing at home using [facility] and changing every second day. His wife is washing the wound with saline.  He is using kerlix. Surprisingly he has not developed a lot of edema. This choice was made because of the degree of fluid retention and maceration even with changing his dressing twice a week 04/23/17; absolutely no change. Using Hydrofera Blue. Recurrent tightly adherent nonviable surface material. This is been refractory to Iodoflex, sorbact and now Hydrofera Blue. More recently he has been changing his own dressings at home, cleansing the wound in the shower. He has not developed lower extremity edema 04/30/17; if anything the wound is larger still in adherent surface although it debrided easily today. I've been using Mehta honey for 1 week and I'd like to try and do it for a second week this see if we can get some form of viable surface here. 05/07/17; patient arrives with copious amounts of drainage and some pain in the superior part of the  wound. He has not been systemically unwell. He's been changing this daily at home 05/14/17; patient arrives today complaining of less drainage and less pain. Dimensions slightly better. Surface culture I did of this last week grew MSSA I put him on dicloxacillin for 10 days. Patient requesting a further prescription since he feels so much better. He did not obtain the [facility] AG for reasons that aren't clear, he has been using Hydrofera Blue 05/21/17; perhaps some less drainage and less pain. He is completing another week of doxycycline. Unfortunately there is no change in the wound measurements are appearance still tightly adherent necrotic surface material that is really defied treatment. We have been using Hydrofera Blue most recently. He did not manage to get Anasept. He was told it was prescription at [facility] 05/29/17; absolutely no improvement in these wound areas. He had remote plastic surgery with who was done at [facility] in [place] surrounding this area. This was a traumatic wound with extensive plastic surgery/skin grafting at that time. I  switched him to sorbact last week to see if he can do anything about the recurrent necrotic surface and drainage. This is largely been refractory to Iodoflex/Hydrofera Blue/alginates. I believe we tried Elby Showers and more recently Medi honey l however the drainage is really too excessive 06/04/17; patient went to see Dr. Ulice Bold. Per the patient she ordered him a compression stocking. Continued the Anasept gel and sorbact was already on. No major improvement in the condition of the wounds in fact the medial wound looks larger. Again per the patient she did not feel a skin graft or operative debridement was indicated The patient had previous venous ablation in February 2018. Last arterial studies were in December 2017 and were within normal limits 06/18/17; patient arrives back in clinic today using Anasept gel with sorbact. He changes this every day is wearing his own compression stocking. The patient actually saw Dr. Leta Baptist of plastic surgery. I've reviewed her note. The appointment was on 05/30/17. The basic issue with here was that she did not feel that skin grafts for patients with underlying stasis and edema have a good long-term success rate. She also discuss skin substitutes which has been done here as well however I have not been able to get the surface of these wounds to something that I think would support skin substitutes. This is why he felt he might need an JERELL, DEMERY. ([id number]) operative debridement more aggressively than I can do in this clinic Review of systems; is otherwise negative he feels well 07/02/17; patient has been using Anasept gel with Sorbact. He changes this every day scrubs out the wound beds. Arrives today with the wound bed looking some better. Easier debridement 07/16/17; patient has been using Anasept gel was sorbact for about a month now. The necrotic service on his wound bed is better and the drainage is now down to minimal but we've not made any  improvements in the epithelialization. Change to Santyl today. Surface of the wound is still not good enough to support skin substitutes 07/30/17 on evaluation today patient appears to be doing very well in regard to his right bimalleolus wounds. He has been tolerating the treatment with the Anasept gel and sorbact. However we were going to try and switch to Santyl to be more aggressive with these wounds. This was however cost prohibitive. Therefore we will likely go back to the sorbact. Nonetheless he is not having any significant discomfort he still is forming slough over the wound location  but nothing too significant. The wounds do appear to be filling in nicely. 08/13/17; I have not seen this wound in about a month. There is not much change. The fact the area medially looks larger. He has been using sorbact and Anasept for over a month now without much effect. Arrives in clinic stating that he does not want the area debrided 08/28/17; patient arrives with the areas on his right medial and right lateral ankle worse. The wounds of expanded there is erythema and still drainage. There is no pain and no tenderness. We had been using Keracel AG clearly not doing well. 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 He states that even the evening that we began utilizing the Iodoflex he started having burning. Subsequently this never really improved and he eventually discontinue the use of the Iodoflex altogether. Unfortunately he did not let us know about any of this until today. I'm unsure of any specific infection at this point although I am going to obtain a wound culture today in order to see if there's anything that could potentially be  causing an infection as well. It does sound as if he's had the Iodoflex before and did not have this kind of reaction although this does sound very specific for a reaction to the Iodoflex. 09/17/17 on evaluation today patient appears to be doing significantly better compared to last week's evaluation. At that time it actually appears that he did have an infection the good news is that we have been able to get this under control with switching to the [facility] solution he's not having as much pain and discomfort he still does have some erythema surrounding the medial aspect wound. He has been tolerating the Dakin's soaked gauze dressing which is excellent and that his wounds do not seem to have as much adherent slough. Overall I'm pleased with the progress she's made in last week. 11/05/17 on evaluation today patient appears to be doing better in regard to his right medial and lateral malleolus ulcers. He has been tolerating the dressing changes without complication. I do flight the Freada Bergeron is helping with additional granulation at this point in the wound beds do appear to be a little bit better. With that being said patient does not appear to have any evidence of significant infection at this point there is no erythema as he has previously noted he does note that the 30 day course of antibiotics I placed him on will end tomorrow. 11/12/17 on evaluation today patient actually appears to be doing excellent in regard to his right medial and lateral malleolus ulcers. He has been tolerating the dressing changes without complication. Fortunately he has no evidence of infection at this time and overall I believe he is progressing extremely nicely he has a lot of new epithelialization noted on evaluation today. Patient likewise is very pleased with the progress even just since last week. 11/19/17 on evaluation today patient appears to be doing about the same in regard to his ulcerations. He does not have  any additional breakdown although he really has not noted any significant improvement either over the past week. With that being said the more concerning thing at this time is that he is beginning to  show signs of erythema surrounding both wounds which is what typically happens and then he will develop into a more overt infection. This has been the course for some time. I hope that doing the 30 day in a biotic cycle this past time would break that so that he would not have the same type of thing happen again. Nonetheless it appears that is digressing back into this infected state unfortunately. With that being said he is not having any pain currently which is good he typically does start to hurt more as this progresses. Fortunately there does not appear to be any evidence of infection systemically which is good news. 11/26/17 on evaluation today patient appears to actually be doing rather well in regard to his ulcer compared to last week. He still has your theme and noted around both the medial and lateral malleolus ulcers of the right lower extremity. He does not seem to be quite as overtly infected however. He has been tolerating the dressing changes without complication which is good news. The gentamicin cream does seem to have been of benefit for him. With that being said he does actually have an appointment with Dr. Sampson Goon this morning to see if there's anything that would be recommended in the way of IV antibiotic Reierson, Bard C. ([id number]) therapy. Otherwise he still has slough noted on the surface of the wound. 12/03/17 on evaluation today patient presents for follow-up concerning his ongoing right lower extremity ulcers. He has been tolerating the dressing changes without complication he states he has not really been using gentamicin on the lateral ankle and this seems to be doing very well. For that reason I think that is probably fine to put a hold on the gentamicin he states  his burns and stings when he applies it and we maybe be able to just use this intermittently when things become more irritated. Other than that the good news is I did review his MRI which was performed on 11/28/17 and revealed that he has a negative MRI for abscess, osteomyelitis, or septic joint. Obviously these were the issues that I was concerned with the possibility of and I'm pleased to find that there is no problems in that regard. I did also review Dr. Jarrett Ables note from infectious disease he discussed performed some lab work which apparently was done and then subsequently was going to consider the possibility of Keflex long-term for prevention of infection although this has not been prescribed as of yet. 12/09/17 on evaluation today patient appears to be doing a little bit worse in regard to his right medial and lateral malleolus ulcers. Fortunately the wound does not seem to be significantly worse although he does have a lot more drainage especially the lateral aspect he is having a lot more pain than what he has noted more recently. Fortunately there does not appear to be any evidence of systemic infection which is good news. Again he has seen Dr. Sampson Goon but he has not been placed on the potential Keflex which was recommended as a possibility at least yet. 12/17/17 unfortunately on evaluation today the patient's wound appears to be doing much worse. He has been performing the dressing changes as directed. Upon a review of notes in epic it does appear that Dr. Jarrett Ables office specifically sending Keflex for the patient on the 16th which was last Tuesday. With that being said the patient states that though this was sent into [facility] that [facility] stated they never received it and subsequently the patient  never took anything. He tells me he has been in bed since Friday due to the increased pain and overall general malaise. No fevers, chills, nausea, or vomiting noted at this  time. 12/31/17 on evaluation today patient appears to be doing very well in regard to his right [employer] ulcers. Definitely much better than when I last saw him. He is on the Keflex as prescribed by Dr. Sampson Goon at this point. Fortunately he shows no signs of infection significantly at this point he also tells me that he is having no pain which is much different from when I last saw him. Currently we are using a silver alginate dressing and gentamicin topically. 01/07/18 on evaluation today patient appears to be doing better overall in my pinion in regard to his wounds. He has been tolerating the dressing changes without complication and overall it seems that the Keflex has been of benefit for him. Patient is happy he states that he is not having pain like he has in the past in fact he's having no pain. 01/14/18 on evaluation today patient appears to be doing better in regard to his right malleolus ulcers. He has been tolerating the dressing changes without complication and in general I feel like things are making great improvement over the past several weeks. I do feel like him being on the Keflex has made a big improvement as well. Overall I'm pleased with how things are progressing. 01/21/18 on evaluation today patient appears to be doing well in regard to the medial ankle ulcer. In regard to his right lateral ulcer there is a little bit of erythema in the periwound region but this is extremely minimal. He is having a little discomfort in the inferior/posterior portion of the wound but again there is no erythema at the site. I'm not certain that he's really having any superinfection at this point although I definitely believe he is having increased discomfort for that reason no sharp debridement will be done in regard to the lateral portion of his wound. Patient History Information obtained from Patient. Social History Former smoker - 10 years ago, Marital Status - Married, Alcohol Use -  Moderate, Drug Use - No History, Caffeine Use - Never. Medical And Surgical History Notes Constitutional Symptoms (General Health) Vein Filter (groin area); CODA; H/O Blood Clots; pulmonary hypertensive arterial disease Review of Systems (ROS) Constitutional Symptoms (General Health) Denies complaints or symptoms of Fever, Chills. LIRON, EISSLER ([id number]) Respiratory The patient has no complaints or symptoms. Cardiovascular Complains or has symptoms of LE edema. Psychiatric The patient has no complaints or symptoms. Objective Constitutional Well-nourished and well-hydrated in no acute distress. Vitals Time Taken: 10:54 AM, Height: 76 in, Weight: 238 lbs, BMI: 29, Temperature: 98.3 F, Pulse: 79 bpm, Respiratory Rate: 18 breaths/min, Blood Pressure: 135/66 mmHg. Respiratory normal breathing without difficulty. Psychiatric this patient is able to make decisions and demonstrates good insight into disease process. Alert and Oriented x 3. pleasant and cooperative. General Notes: Currently patient's wound bed did show slough noted over both surfaces. The medial wound was sharply debrided today with good result post debridement the wound bed appears to be doing much better and overall he has noted significant improvement in my pinion. With that being said the lateral ankle wound due to the pain was not sharply debrided today. He did have slough noted on the surface but we're just gonna let this ride for the next week due to the discomfort he's been experiencing. Integumentary (Hair, Skin) Wound #1 status  is Open. Original cause of wound was Gradually Appeared. The wound is located on the Right,Lateral Malleolus. The wound measures 6.3cm length x 5.8cm width x 0.2cm depth; 28.698cm^2 area and 5.74cm^3 volume. There is Fat Layer (Subcutaneous Tissue) Exposed exposed. There is no tunneling or undermining noted. There is a medium amount of purulent drainage noted. The wound margin is  distinct with the outline attached to the wound base. There is small (1-33%) pink granulation within the wound bed. There is a large (67-100%) amount of necrotic tissue within the wound bed including Adherent Slough. The periwound skin appearance did not exhibit: Callus, Crepitus, Excoriation, Induration, Rash, Scarring, Dry/Scaly, Maceration, Atrophie Blanche, Cyanosis, Ecchymosis, Hemosiderin Staining, Mottled, Pallor, Rubor, Erythema. Periwound temperature was noted as No Abnormality. The periwound has tenderness on palpation. Wound #2 status is Open. Original cause of wound was Gradually Appeared. The wound is located on the Right,Medial Malleolus. The wound measures 4.1cm length x 3.2cm width x 0.1cm depth; 10.304cm^2 area and 1.03cm^3 volume. There is Fat Layer (Subcutaneous Tissue) Exposed exposed. There is no tunneling or undermining noted. There is a medium amount of purulent drainage noted. The wound margin is distinct with the outline attached to the wound base. There is small (1-33%) red granulation within the wound bed. There is a large (67-100%) amount of necrotic tissue within the wound bed including Adherent Slough. The periwound skin appearance did not exhibit: Callus, Crepitus, Excoriation, Induration, Rash, Scarring, Dry/Scaly, Maceration, Atrophie Blanche, Cyanosis, Ecchymosis, Hemosiderin Staining, Mottled, Pallor, Rubor, Erythema. Periwound temperature was noted as No Abnormality. The periwound has tenderness on palpation. [name], Dougles C. 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 There was a Excisional Skin/Subcutaneous Tissue Debridement with a total area of 13.12 sq cm performed by STONE III, HOYT E., PA-C. With the following instrument(s): Curette to remove Viable and Non-Viable tissue/material. Material removed includes Subcutaneous Tissue, Slough, and Fibrin/Exudate after achieving pain control using Lidocaine 4% Topical Solution. No specimens were taken. A time out was conducted at 11:25, prior to the start of the procedure. A Minimum amount of bleeding was controlled with Pressure. The procedure was tolerated well with a pain level of 0 throughout and a pain level of 0 following the procedure. Patient s Level of Consciousness post procedure was recorded as Awake and Alert. Post Debridement Measurements: 4.1cm length x 3.2cm width x 0.2cm depth; 2.061cm^3 volume. Character of Wound/Ulcer Post Debridement requires further debridement. Severity of Tissue Post Debridement is: Fat layer exposed. Post procedure Diagnosis Wound #2: Same as Pre-Procedure Plan Wound Cleansing: Wound #1 Right,Lateral Malleolus: Clean wound with Normal Saline. Cleanse wound with mild soap and water May Shower, gently pat wound dry prior to applying new dressing. Wound #2 Right,Medial Malleolus: Clean wound with Normal Saline. Cleanse wound with mild soap and water May Shower, gently pat wound dry prior to applying new dressing. Anesthetic (add to Medication List): Wound #1 Right,Lateral Malleolus: Topical Lidocaine 4% cream applied to wound bed prior to debridement (In Clinic Only). Wound #2 Right,Medial Malleolus: Topical Lidocaine 4% cream applied to wound  bed prior to debridement (In Clinic Only). Skin Barriers/Peri-Wound Care: Wound #1 Right,Lateral Malleolus: Barrier cream Wound #2 Right,Medial  Malleolus: Barrier cream DURWOOD, DITTUS ([id number]) Primary Wound Dressing: Wound #1 Right,Lateral Malleolus: Gentamicin Sulfate Cream - only on wound bed Silver Alginate - only on wound bed Wound #2 Right,Medial Malleolus: Gentamicin Sulfate Cream - only on wound bed Silver Alginate - only on wound bed Secondary Dressing: Wound #1 Right,Lateral Malleolus: Dry Gauze Conform/Kerlix Wound #2 Right,Medial Malleolus: Dry Gauze Conform/Kerlix Dressing Change Frequency: Wound #1 Right,Lateral Malleolus: Change dressing every other day. Wound #2 Right,Medial Malleolus: Change dressing every other day. Follow-up Appointments: Wound #1 Right,Lateral Malleolus: Return Appointment in 1 week. Wound #2 Right,Medial Malleolus: Return Appointment in 1 week. Edema Control: Wound #1 Right,Lateral Malleolus: Elevate legs to the level of the heart and pump ankles as often as possible Wound #2 Right,Medial Malleolus: Elevate legs to the level of the heart and pump ankles as often as possible Additional Orders / Instructions: Wound #1 Right,Lateral Malleolus: Increase protein intake. Wound #2 Right,Medial Malleolus: Increase protein intake. Medications-please add to medication list.: Wound #1 Right,Lateral Malleolus: Other: - Vitamin C, Zinc, MVI Wound #2 Right,Medial Malleolus: Other: - Vitamin C, Zinc, MVI The following medication(s) was prescribed: lidocaine topical 4 % cream 1 1 cream topical was prescribed at facility I'm gonna suggest that we go ahead and continue with the Current wound care measures for the next week. Patient is in agreement with the plan. We will subsequently see were things stand at follow-up when he returns in one week. Hopefully he will have continued improvement and hopefully the pain will also be improved in regard to the lateral portion of his ankle. Otherwise if anything changes he'll let us know. Please see above for specific wound care orders. We will  see patient for re-evaluation in 1 week(s) here in the clinic. If anything worsens or changes patient will contact our office for additional recommendations. Electronic Signature(s) Signed: 01/22/2018 8:29:14 AM By: Lenda Kelp PA-C Entered By: Lenda Kelp on 01/21/2018 11:51:20 [name] ([id number]) [name], [name] ([id number]) -------------------------------------------------------------------------------- ROS/PFSH Details Patient Name: [name] Date of Service: 01/21/2018 10:30 AM Medical Record Number: [id number] Patient Account Number: [id number] Date of Birth/Sex: 10-30-1945 (72 y.o. M) Treating RN: [name] Primary Care Provider: [name] Other Clinician: Referring Provider: [name] Treating Provider/Extender: STONE III, HOYT Weeks in Treatment: 105 Information Obtained From Patient Wound History Do you currently have one or more open woundso Yes How many open wounds do you currently haveo 1 Approximately how long have you had your woundso 5 months How have you been treating your wound(s) until nowo saline, dressing Has your wound(s) ever healed and then re-openedo Yes Have you had any lab work done in the past montho No Have you tested positive for an antibiotic resistant organism (MRSA, VRE)o No Have you tested positive for osteomyelitis (bone infection)o No Have you had any tests for circulation on your legso No Constitutional Symptoms (General Health) Complaints and Symptoms: Negative for: Fever; Chills Medical History: Past Medical History Notes: Vein Filter (groin area); CODA; H/O Blood Clots; pulmonary hypertensive arterial disease Cardiovascular Complaints and Symptoms: Positive for: LE edema Medical History: Negative for: Angina; Arrhythmia; Congestive Heart Failure; Coronary Artery Disease; Deep Vein Thrombosis; Hypertension; Hypotension; Myocardial Infarction; Peripheral Arterial Disease; Peripheral Venous  Disease; Phlebitis; Vasculitis Eyes Medical History: Positive for: Cataracts - removed Negative for: Glaucoma; Optic Neuritis Ear/Nose/Mouth/Throat Medical History: Negative for: Chronic sinus problems/congestion Hematologic/Lymphatic Medical  History: Negative for: Anemia; Hemophilia; Human Immunodeficiency Virus; Lymphedema; Sickle Cell Disease Respiratory Yett, Giancarlo C. ([id number]) Complaints and Symptoms: No Complaints or Symptoms Medical History: Positive for: Chronic Obstructive Pulmonary Disease (COPD) Negative for: Aspiration; Asthma; Pneumothorax; Sleep Apnea; Tuberculosis Gastrointestinal Medical History: Negative for: Cirrhosis ; Colitis; Crohnos; Hepatitis A; Hepatitis B; Hepatitis C Endocrine Medical History: Negative for: Type I Diabetes; Type II Diabetes Genitourinary Medical History: Negative for: End Stage Renal Disease Immunological Medical History: Negative for: Lupus Erythematosus; Raynaudos Integumentary (Skin) Medical History: Negative for: History of Burn; History of pressure wounds Musculoskeletal Medical History: Positive for: Osteoarthritis Negative for: Gout; Rheumatoid Arthritis; Osteomyelitis Neurologic Medical History: Negative for: Dementia; Neuropathy; Quadriplegia; Paraplegia; Seizure Disorder Oncologic Medical History: Negative for: Received Chemotherapy; Received Radiation Psychiatric Complaints and Symptoms: No Complaints or Symptoms Medical History: Negative for: Anorexia/bulimia; Confinement Anxiety HBO Extended History Items Eyes: Cataracts Hinchliffe, Royce C. ([id number]) Immunizations Pneumococcal Vaccine: Received Pneumococcal Vaccination: No Implantable Devices Family and Social History Former smoker - 10 years ago; Marital Status - Married; Alcohol Use: Moderate; Drug Use: No History; Caffeine Use: Never; Advanced Directives: No; Patient does not want information on Advanced Directives; Living Will: No;  Medical Power of Attorney: No Physician Affirmation I have reviewed and agree with the above information. Electronic Signature(s) Signed: 01/22/2018 8:29:14 AM By: Lenda Kelp PA-C Signed: 01/24/2018 5:38:20 PM By: Alejandro Mulling Entered By: Lenda Kelp on 01/21/2018 11:49:56 [name] ([id number]) -------------------------------------------------------------------------------- SuperBill Details Patient Name: [name] Date of Service: 01/21/2018 Medical Record Number: [id number] Patient Account Number: [id number] Date of Birth/Sex: [birth year] (72 y.o. 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, initial encounter T81.31XD Disruption of external operation (surgical) wound, not elsewhere classified, subsequent encounter Facility Procedures CPT4 Code: 62952841 Description: 11042 - DEB SUBQ TISSUE 20 SQ CM/< ICD-10 Diagnosis Description L97.312 Non-pressure chronic ulcer of right ankle with fat layer expo Modifier: sed Quantity: 1 Physician Procedures CPT4 Code: 3244010 Description: 11042 - WC PHYS SUBQ TISS 20 SQ CM ICD-10 Diagnosis Description L97.312 Non-pressure chronic ulcer of right ankle with fat layer expo Modifier: sed Quantity: 1 Electronic Signature(s) Signed: 01/22/2018 8:29:14 AM By: Lenda Kelp PA-C Entered By: Lenda Kelp on 01/21/2018 11:51:43

## 2018-01-26 NOTE — Progress Notes (Signed)
[name], [name] ([id number]) Visit Report for 01/21/2018 Arrival Information Details Patient Name: [name], [name] Date of Service: 01/21/2018 10:30 AM Medical Record Number: [id number] Patient Account Number: [id number] Date of Birth/Sex: 1946/01/08 69(71 y.o. M) Treating RN: Curtis Sitesorthy, Joanna Primary Care Ezrie Bunyan: Darreld McleanMILES, LINDA Other Clinician: Referring Daylyn Azbill: Darreld McleanMILES, LINDA Treating Woodley Petzold/Extender: Linwood DibblesSTONE III, HOYT Weeks in Treatment: 105 Visit Information History Since Last Visit Added or deleted any medications: No Patient Arrived: Ambulatory Any new allergies or adverse reactions: No Arrival Time: 10:54 Had a fall or experienced change in No Accompanied By: self activities of daily living that may affect Transfer Assistance: None risk of falls: Patient Identification Verified: Yes Signs or symptoms of abuse/neglect since last visito No Secondary Verification Process Yes Hospitalized since last visit: No Completed: Implantable device outside of the clinic excluding No Patient Requires Transmission-Based No cellular tissue based products placed in the center Precautions: since last visit: Patient Has Alerts: Yes Has Dressing in Place as Prescribed: Yes Patient Alerts: Patient on Blood Pain Present Now: No Thinner Electronic Signature(s) Signed: 01/21/2018 5:40:20 PM By: Curtis Sitesorthy, Joanna Entered By: Curtis Sitesorthy, Joanna on 01/21/2018 10:54:32 [name], [name] ([id number]) -------------------------------------------------------------------------------- Lower Extremity Assessment Details Patient Name: [name], [name] Date of Service: 01/21/2018 10:30 AM Medical Record Number: [id number] Patient Account Number: [id number] Date of Birth/Sex: 1946/01/08 4(71 y.o. M) Treating RN: Curtis Sitesorthy, Joanna Primary Care Jadavion Spoelstra: Darreld McleanMILES, LINDA Other Clinician: Referring Viha Kriegel: Darreld McleanMILES, LINDA Treating Xxavier Noon/Extender: STONE III, HOYT Weeks in Treatment: 105 Edema  Assessment Assessed: [Left: No] [Right: No] Edema: [Left: N] [Right: o] Vascular Assessment Pulses: Dorsalis Pedis Palpable: [Right:Yes] Posterior Tibial Extremity colors, hair growth, and conditions: Extremity Color: [Right:Hyperpigmented] Hair Growth on Extremity: [Right:No] Temperature of Extremity: [Right:Warm] Capillary Refill: [Right:< 3 seconds] Toe Nail Assessment Left: Right: Thick: Yes Discolored: Yes Deformed: Yes Improper Length and Hygiene: Yes Electronic Signature(s) Signed: 01/21/2018 5:40:20 PM By: Curtis Sitesorthy, Joanna Entered By: Curtis Sitesorthy, Joanna on 01/21/2018 11:06:02 [name], [name] ([id number]) -------------------------------------------------------------------------------- Multi Wound Chart Details Patient Name: [name], [name] Date of Service: 01/21/2018 10:30 AM Medical Record Number: [id number] Patient Account Number: [id number] Date of Birth/Sex: 1946/01/08 2(71 y.o. M) Treating RN: Phillis HaggisPinkerton, [name] Primary Care Ronnell Clinger: Darreld McleanMILES, LINDA Other Clinician: Referring Vegas Coffin: Darreld McleanMILES, LINDA Treating Beckem Tomberlin/Extender: STONE III, HOYT Weeks in Treatment: 105 Vital Signs Height(in): 76 Pulse(bpm): 79 Weight(lbs): 238 Blood Pressure(mmHg): 135/66 Body Mass Index(BMI): 29 Temperature(F): 98.3 Respiratory Rate 18 (breaths/min): Photos: [1:No Photos] [2:No Photos] [N/A:N/A] Wound Location: [1:Right Malleolus - Lateral] [2:Right Malleolus - Medial] [N/A:N/A] Wounding Event: [1:Gradually Appeared] [2:Gradually Appeared] [N/A:N/A] Primary Etiology: [1:Venous Leg Ulcer] [2:Venous Leg Ulcer] [N/A:N/A] Comorbid History: [1:Cataracts, Chronic Obstructive Cataracts, Chronic Obstructive N/A Pulmonary Disease (COPD), Pulmonary Disease (COPD), Osteoarthritis] [2:Osteoarthritis] Date Acquired: [1:08/11/2015] [2:01/30/2016] [N/A:N/A] Weeks of Treatment: [1:105] [2:103] [N/A:N/A] Wound Status: [1:Open] [2:Open] [N/A:N/A] Clustered Wound: [1:No] [2:Yes]  [N/A:N/A] Measurements L x W x D [1:6.3x5.8x0.2] [2:4.1x3.2x0.1] [N/A:N/A] (cm) Area (cm) : [1:28.698] [2:10.304] [N/A:N/A] Volume (cm) : [1:5.74] [2:1.03] [N/A:N/A] % Reduction in Area: [1:-66.10%] [2:10.70%] [N/A:N/A] % Reduction in Volume: [1:-10.70%] [2:10.80%] [N/A:N/A] Classification: [1:Full Thickness Without Exposed Support Structures] [2:Full Thickness Without Exposed Support Structures] [N/A:N/A] Exudate Amount: [1:Medium] [2:Medium] [N/A:N/A] Exudate Type: [1:Purulent] [2:Purulent] [N/A:N/A] Exudate Color: [1:yellow, brown, green] [2:yellow, brown, green] [N/A:N/A] Wound Margin: [1:Distinct, outline attached] [2:Distinct, outline attached] [N/A:N/A] Granulation Amount: [1:Small (1-33%)] [2:Small (1-33%)] [N/A:N/A] Granulation Quality: [1:Pink] [2:Red] [N/A:N/A] Necrotic Amount: [1:Large (67-100%)] [2:Large (67-100%)] [N/A:N/A] Exposed Structures: [1:Fat Layer (Subcutaneous Tissue) Exposed: Yes Fascia: No Tendon: No Muscle: No Joint: No Bone: No] [2:Fat Layer (Subcutaneous  Tissue) Exposed: Yes Fascia: No Tendon: No Muscle: No Joint: No Bone: No] [N/A:N/A] Epithelialization: [1:None] [2:None] [N/A:N/A] Periwound Skin Texture: [1:Excoriation: No Induration: No Callus: No] [2:Excoriation: No Induration: No Callus: No] [N/A:N/A] Crepitus: No Crepitus: No Rash: No Rash: No Scarring: No Scarring: No Periwound Skin Moisture: Maceration: No Maceration: No N/A Dry/Scaly: No Dry/Scaly: No Periwound Skin Color: Atrophie Blanche: No Atrophie Blanche: No N/A Cyanosis: No Cyanosis: No Ecchymosis: No Ecchymosis: No Erythema: No Erythema: No Hemosiderin Staining: No Hemosiderin Staining: No Mottled: No Mottled: No Pallor: No Pallor: No Rubor: No Rubor: No Temperature: No Abnormality No Abnormality N/A Tenderness on Palpation: Yes Yes N/A Wound Preparation: Ulcer Cleansing: Ulcer Cleansing: N/A Rinsed/Irrigated with Saline Rinsed/Irrigated with Saline Topical  Anesthetic Applied: Topical Anesthetic Applied: Other: lidocAINE 4% Other: LIDOCAINE 4% Treatment Notes Electronic Signature(s) Signed: 01/24/2018 5:38:20 PM By: Alejandro Mulling Entered By: Alejandro Mulling on 01/21/2018 11:23:50 [name] ([id number]) -------------------------------------------------------------------------------- Multi-Disciplinary Care Plan Details Patient Name: [name] Date of Service: 01/21/2018 10:30 AM Medical Record Number: [id number] Patient Account Number: [id number] Date of Birth/Sex: 1946-06-07 (72 y.o. M) Treating RN: [name] Primary Care Aveline Daus: [name] Other Clinician: Referring Primrose Oler: [name] Treating Shealeigh Dunstan/Extender: STONE III, HOYT Weeks in Treatment: 105 Active Inactive ` Venous Leg Ulcer Nursing Diagnoses: Knowledge deficit related to disease process and management Goals: Patient will maintain optimal edema control Date Initiated: 04/03/2016 Target Resolution Date: 11/24/2016 Goal Status: Active Interventions: Compression as ordered Treatment Activities: Therapeutic compression applied : 04/03/2016 Notes: ` Wound/Skin Impairment Nursing Diagnoses: Impaired tissue integrity Goals: Ulcer/skin breakdown will heal within 14 weeks Date Initiated: 01/17/2016 Target Resolution Date: 11/24/2016 Goal Status: Active Interventions: Assess ulceration(s) every visit Notes: Electronic Signature(s) Signed: 01/24/2018 5:38:20 PM By: Alejandro Mulling Entered By: Alejandro Mulling on 01/21/2018 11:22:57 [name] ([id number]) -------------------------------------------------------------------------------- Pain Assessment Details Patient Name: [name] Date of Service: 01/21/2018 10:30 AM Medical Record Number: [id number] Patient Account Number: [id number] Date of Birth/Sex: Mar 04, 1946 (72 y.o. M) Treating RN: Curtis Sites Primary Care Savhanna Sliva: [name] Other Clinician: Referring  Jarren Para: [name] Treating Tallie Dodds/Extender: STONE III, HOYT Weeks in Treatment: 105 Active Problems Location of Pain Severity and Description of Pain Patient Has Paino Yes Site Locations Pain Location: Pain in Ulcers With Dressing Change: Yes Duration of the Pain. Constant / Intermittento Intermittent Pain Management and Medication Current Pain Management: Electronic Signature(s) Signed: 01/21/2018 5:40:20 PM By: Curtis Sites Entered By: Curtis Sites on 01/21/2018 10:54:44 [name] ([id number]) -------------------------------------------------------------------------------- Wound Assessment Details Patient Name: [name] Date of Service: 01/21/2018 10:30 AM Medical Record Number: [id number] Patient Account Number: [id number] Date of Birth/Sex: Sep 03, 1945 (72 y.o. M) Treating RN: Curtis Sites Primary Care Genessis Flanary: [name] Other Clinician: Referring Flora Ratz: [name] Treating Leilany Digeronimo/Extender: STONE III, HOYT Weeks in Treatment: 105 Wound Status Wound Number: 1 Primary Venous Leg Ulcer Etiology: Wound Location: Right Malleolus - Lateral Wound Open Wounding Event: Gradually Appeared Status: Date Acquired: 08/11/2015 Comorbid Cataracts, Chronic Obstructive Pulmonary Weeks Of Treatment: 105 History: Disease (COPD), Osteoarthritis Clustered Wound: No Photos Photo Uploaded By: Curtis Sites on 01/21/2018 15:25:42 Wound Measurements Length: (cm) 6.3 Width: (cm) 5.8 Depth: (cm) 0.2 Area: (cm) 28.698 Volume: (cm) 5.74 % Reduction in Area: -66.1% % Reduction in Volume: -10.7% Epithelialization: None Tunneling: No Undermining: No Wound Description Full Thickness Without Exposed Support Classification: Structures Wound Margin: Distinct, outline attached Exudate Medium Amount: Exudate Type: Purulent Exudate Color: yellow, brown, green Foul Odor After Cleansing: No Slough/Fibrino Yes Wound Bed Granulation Amount:  Small (1-33%) Exposed Structure Granulation Quality: Pink Fascia Exposed: No Necrotic Amount: Large (67-100%) Fat Layer (Subcutaneous Tissue) Exposed: Yes Necrotic Quality: Adherent Slough Tendon Exposed: No Muscle Exposed: No Joint Exposed: No Bone Exposed: No Casso, Hallie C. ([id number]) Periwound Skin Texture Texture Color No Abnormalities Noted: No No Abnormalities Noted: No Callus: No Atrophie Blanche: No Crepitus: No Cyanosis: No Excoriation: No Ecchymosis: No Induration: No Erythema: No Rash: No Hemosiderin Staining: No Scarring: No Mottled: No Pallor: No Moisture Rubor: No No Abnormalities Noted: No Dry / Scaly: No Temperature / Pain Maceration: No Temperature: No Abnormality Tenderness on Palpation: Yes Wound Preparation Ulcer Cleansing: Rinsed/Irrigated with Saline Topical Anesthetic Applied: Other: lidocAINE 4%, Electronic Signature(s) Signed: 01/21/2018 5:40:20 PM By: Curtis Sites Entered By: Curtis Sites on 01/21/2018 11:03:01 [name] ([id number]) -------------------------------------------------------------------------------- Wound Assessment Details Patient Name: [name] Date of Service: 01/21/2018 10:30 AM Medical Record Number: [id number] Patient Account Number: [id number] Date of Birth/Sex: 08-Nov-1945 (72 y.o. M) Treating RN: Curtis Sites Primary Care Diana Armijo: [name] Other Clinician: Referring Aravind Chrismer: [name] Treating Bruce Mayers/Extender: STONE III, HOYT Weeks in Treatment: 105 Wound Status Wound Number: 2 Primary Venous Leg Ulcer Etiology: Wound Location: Right Malleolus - Medial Wound Open Wounding Event: Gradually Appeared Status: Date Acquired: 01/30/2016 Comorbid Cataracts, Chronic Obstructive Pulmonary Weeks Of Treatment: 103 History: Disease (COPD), Osteoarthritis Clustered Wound: Yes Photos Photo Uploaded By: Curtis Sites on 01/21/2018 15:25:43 Wound Measurements Length: (cm)  4.1 Width: (cm) 3.2 Depth: (cm) 0.1 Area: (cm) 10.304 Volume: (cm) 1.03 % Reduction in Area: 10.7% % Reduction in Volume: 10.8% Epithelialization: None Tunneling: No Undermining: No Wound Description Full Thickness Without Exposed Support Classification: Structures Wound Margin: Distinct, outline attached Exudate Medium Amount: Exudate Type: Purulent Exudate Color: yellow, brown, green Foul Odor After Cleansing: No Slough/Fibrino Yes Wound Bed Granulation Amount: Small (1-33%) Exposed Structure Granulation Quality: Red Fascia Exposed: No Necrotic Amount: Large (67-100%) Fat Layer (Subcutaneous Tissue) Exposed: Yes Necrotic Quality: Adherent Slough Tendon Exposed: No Muscle Exposed: No Joint Exposed: No Bone Exposed: No [place], Azariah C. ([id number]) Periwound Skin Texture Texture Color No Abnormalities Noted: No No Abnormalities Noted: No Callus: No Atrophie Blanche: No Crepitus: No Cyanosis: No Excoriation: No Ecchymosis: No Induration: No Erythema: No Rash: No Hemosiderin Staining: No Scarring: No Mottled: No Pallor: No Moisture Rubor: No No Abnormalities Noted: No Dry / Scaly: No Temperature / Pain Maceration: No Temperature: No Abnormality Tenderness on Palpation: Yes Wound Preparation Ulcer Cleansing: Rinsed/Irrigated with Saline Topical Anesthetic Applied: Other: LIDOCAINE 4%, Electronic Signature(s) Signed: 01/21/2018 5:40:20 PM By: Curtis Sites Entered By: Curtis Sites on 01/21/2018 11:04:55 [name] ([id number]) -------------------------------------------------------------------------------- Vitals Details Patient Name: [name] Date of Service: 01/21/2018 10:30 AM Medical Record Number: [id number] Patient Account Number: [id number] Date of Birth/Sex: 1946/08/18 (72 y.o. M) Treating RN: Curtis Sites Primary Care Juda Lajeunesse: [name] Other Clinician: Referring Brynn Mulgrew: [name] Treating  Tynetta Bachmann/Extender: STONE III, HOYT Weeks in Treatment: 105 Vital Signs Time Taken: 10:54 Temperature (F): 98.3 Height (in): 76 Pulse (bpm): 79 Weight (lbs): 238 Respiratory Rate (breaths/min): 18 Body Mass Index (BMI): 29 Blood Pressure (mmHg): 135/66 Reference Range: 80 - 120 mg / dl Electronic Signature(s) Signed: 01/21/2018 5:40:20 PM By: Curtis Sites Entered By: Curtis Sites on 01/21/2018 10:56:13

## 2018-01-28 ENCOUNTER — Encounter: Payer: Medicare HMO | Attending: Physician Assistant | Admitting: Physician Assistant

## 2018-01-28 DIAGNOSIS — Z888 Allergy status to other drugs, medicaments and biological substances status: Secondary | ICD-10-CM | POA: Diagnosis not present

## 2018-01-28 DIAGNOSIS — Z87891 Personal history of nicotine dependence: Secondary | ICD-10-CM | POA: Insufficient documentation

## 2018-01-28 DIAGNOSIS — Z86718 Personal history of other venous thrombosis and embolism: Secondary | ICD-10-CM | POA: Diagnosis not present

## 2018-01-28 DIAGNOSIS — M1990 Unspecified osteoarthritis, unspecified site: Secondary | ICD-10-CM | POA: Diagnosis not present

## 2018-01-28 DIAGNOSIS — Z95828 Presence of other vascular implants and grafts: Secondary | ICD-10-CM | POA: Diagnosis not present

## 2018-01-28 DIAGNOSIS — H269 Unspecified cataract: Secondary | ICD-10-CM | POA: Insufficient documentation

## 2018-01-28 DIAGNOSIS — Z7901 Long term (current) use of anticoagulants: Secondary | ICD-10-CM | POA: Insufficient documentation

## 2018-01-28 DIAGNOSIS — L97312 Non-pressure chronic ulcer of right ankle with fat layer exposed: Secondary | ICD-10-CM | POA: Diagnosis not present

## 2018-01-28 DIAGNOSIS — L97319 Non-pressure chronic ulcer of right ankle with unspecified severity: Secondary | ICD-10-CM | POA: Diagnosis present

## 2018-01-28 DIAGNOSIS — J449 Chronic obstructive pulmonary disease, unspecified: Secondary | ICD-10-CM | POA: Diagnosis not present

## 2018-01-29 NOTE — Progress Notes (Signed)
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 be unhealthy with thick gelatinous slough and nonviable tissue. The wound will not heal like this. 04/20/2016 -- the patient was here for a nurse visit but I was asked to see the patient  as the slough was quite significant and the nurse needed for clarification regarding the ointment to be used. 04/24/16; the patient's wounds on the right medial and right lateral ankle/malleolus both look a lot better today. Less adherent slough healthier tissue. Dimensions better especially medially 05/01/16; the patient's wound surface continues to improve however he continues to require debridement switch her easier each week. Continue Santyl/Metahydrin mixture Hydrofera Blue next week. Still drainage on the medial aspect  according to the intake nurse 05/08/16; still using Santyl and Medihoney. Still a lot of drainage per her intake nurse. Patient has no complaints pain fever chills etc. 05/15/16 switched the Hydrofera Blue last week. Dimensions down especially in the medial right leg wound. Area on the lateral which is more substantial also looks better still requires debridement 05/22/16; we have been using Hydrofera Blue. 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 Currently he tells me that the pain is doing very well about a 1 out of 10 today. Yesterday was a little bit worse but he tells me that he was more active watering his flowers that day. Overall he feels that his symptoms are doing significantly better at this point in time. His edema continues to be controlled well with the 4-layer compression wrap and he really has not noted any odor at this point in time. He is tolerating the dressing changes when they are performed well. 06/05/16 at this point in time today patient currently shows no interval signs or symptoms of local or systemic infection. Again his pain level he rates to be a 1 out of 10 at most and overall he tells me that generally this is not giving him much trouble.  In fact he even feels maybe a little bit better than last week. We have continue with the 4-layer compression wrap in which she tolerates very well at this point. He is continuing to utilize the [place]. 06/12/16 I think there has been some progression in the status of both of these wounds over today again covered in a gelatinous surface. Has been using Hydrofera Blue. We had used Iodoflex in the past I'm not sure if there was an issue other than changing to something that might progress towards closure faster 06/19/16; he did not tolerate the Flexeril last week secondary to pain and this was changed on Friday back to [facility] area he continues to have copious amounts of gelatinous surface slough which is think inhibiting the speed of healing this area 06/26/16 patient over the last week has utilized the Santyl to try to loosen up some of the tightly adherent slough that was noted on evaluation last week. The good news is he tells me that the medial malleoli region really does not bother him the right llateral malleoli region is more tender to palpation at this point in time especially in the central/inferior location. However it does appear that the Santyl has done his job to loosen up the adherent slough at this point in time. Fortunately he has no interval signs or symptoms of infection locally or systemically no purulent discharge noted. 07/03/16 at this point in time today patient's wounds appear to be significantly improved over the right medial and lateral malleolus locations. He has much less tenderness at this point in time and the wounds appear clean her although there is still adherent slough this is sufficiently improved over what I saw last week. I still see no evidence of local infection. 07/10/16; continued gradual improvement in the right medial and lateral malleolus locations. The lateral is more substantial wound now divided into 2 by a rim of normal  epithelialization. 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 He denies any  complaints of pain or intolerance to compression. Both ulcers are smaller in size oriented today's measurements, both are covered with a softly adherent slough. 07/24/16; medial wound is smaller, lateral about the same although surface looks better. Still using Hydrofera Blue 07/31/16; arrives today complaining of pain in the lateral part of his foot. Nurse reports a lot more drainage. He has been using Hydrofera Blue. Switch to silver alginate today 08/03/2016 -- I was asked to see the patient was here for a nurse visit today. I understand he had a lot of pain in his right lower extremity and was having blisters on his right foot which have not been there before. Though he started on doxycycline he does not have blisters elsewhere on his body. I do not believe this is a drug allergy. also mentioned that there was a copious purulent discharge from the wound and clinically there is no evidence of cellulitis. 08/07/16; I note that the patient came in for his nurse check on Friday apparently with blisters on his toes on the right than a lot of swelling in his forefoot. He continued on the doxycycline that I had prescribed on 12/8. A culture was done of the lateral wound that showed a combination of a few Proteus and Pseudomonas. Doxycycline might of covered the Proteus but would be unlikely to cover the Pseudomonas. He is on Coumadin. He arrives in the clinic today feeling a lot better states the pain is a lot better but nothing specific really was done other than to rewrap the foot also noted that he had arterial studies ordered in August although these were never done. It is reasonable to go ahead and reorder these. 08/14/16; generally arrives in a better state today in terms of the wounds he has taken  cefdinir for one week. Our intake nurse reports copious amounts of drainage but the patient is complaining of much less pain. He is not had his PT and INR checked and I've asked him to do this today or tomorrow. 08/24/2016 -- patient arrives today after 10 days and said he had a stomach upset. His arterial study was done and I have reviewed this report and find it to be within normal limits. However I did not note any venous duplex studies for reflux, and Dr. Leanord Hawking may have ordered these in the past but I will leave it to him to decide if he needs these. The patient has finished his course of cefdinir. 08/28/16; patient arrives today again with copious amounts of thick really green drainage for our intake nurse. He states he has a very tender spot at the superior part of the lateral wound. Wounds are larger 09/04/16; no real change in the condition of this patient's wound still copious amounts of surface slough. Started him on Iodoflex last week he is completing another course of Cefdinir or which I think was done empirically. His arterial study showed ABIs were 1.1 on the right 1.5 on the left. He did have a slightly reduced ABI in the right the left one was not obtained. Had calcification of the right posterior tibial artery. The interpretation was no segmental stenosis. His waveforms were triphasic. NASON, CONRADT ([id number]) His reflux studies are later this month. Depending on this I'll send him for a vascular consultation, he may need to see plastic surgery as I believe he is had plastic surgery on this foot in the past. He had an injury to the foot in the 1980s. 1/16 /18 right lateral greater than  right medial ankle wounds on the right in the setting of previous skin grafting. 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 He does not have arterial issues. Each week he comes in with the same adherent surface  slough although there was less of this today 09/18/16; right lateral greater than right medial lower extremity wounds in the setting of previous skin grafting and trauma. He has least to vein laser ablation scheduled for February 2 for venous reflux. He does not have significant arterial disease. Problem has been very difficult to handle surface slough/necrotic tissue. Recently using Iodoflex for this with some, albeit slow improvement 09/25/16; right lateral greater than right medial lower extremity venous wounds in the setting of previous skin grafting. He is going for ablation surgery on February 2 after this he'll come back here for rewrap. He has been using Iodoflex as the primary dressing. 10/02/16; right lateral greater than right medial lower extremity wounds in the setting of previous skin grafting. He had his ablation surgery last week, I don't have a report. He tolerated this well. Came in with a thigh-high Unna boots on Friday. We have been using Iodoflex as the primary dressing. His measurements are improving 10/09/16; continues to make nice aggressive in terms of the wounds on his lateral and medial right ankle in the setting of previous skin grafting. Yesterday he noticed drainage at one of his surgical sites from his venous ablation on the right calf. He took off the bandage over this area felt a "popping" sensation and a reddish-brown drainage. He is not complaining of any pain 10/16/16; he continues to make nice progression in terms of the wounds on the lateral and medial malleolus. Both smaller using Iodoflex. He had a surgical area in his posterior mid calf we have been using iodoform. All the wounds are down and dimensions 10/23/16; the patient arrives today with no complaints. He states the Iodoflex is a bit uncomfortable. He is not systemically unwell. We have been using Iodoflex to the lateral right ankle and the medial and Aquacel Ag to the reflux surgical wound on the posterior  right calf. All of these wounds are doing well 10/30/16; patient states he has no pain no systemic symptoms. I changed him to [facility] last week. Although the wounds are doing well 11/06/16; patient reports no pain or systemic symptoms. We continue with Hydrofera Blue. Both wound areas on the medial and lateral ankle appear to be doing well with improvement and dimensions and improvement in the wound bed. 11/13/16; patient's dimensions continued to improve. We continue with Hydrofera Blue on the medial and lateral side. Appear to be doing well with healthy granulation and advancing epithelialization 11/20/16; patient's dimensions improving laterally by about half a centimeter in length. Otherwise no change on the medial side. Using [facility] 12/04/16; no major change in patient's wound dimensions. Intake nurse reports more drainage. 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 He is voicing no complaints or concerns. He has been tolerating his twice weekly compression therapy changes 12/11/16 Patient complains of pain and increased drainage.. wants hydrofera blue 12/18/16 improvement. Sorbact 12/25/16; medial wound is smaller, lateral measures the same. Still on sorbact 01/01/17; medial wound continues to be smaller, lateral measures about the same however there is clearly advancing epithelialization here as well in  fact I think the wound will ultimately divided into 2 open areas 01/08/17; unfortunately today fairly significant regression in several areas. Surface of the lateral wound covered again in adherent necrotic material which is difficult to debridement. He has significant surrounding skin maceration. The expanding area of tissue epithelialization in the middle of the wound that was encouraging last week appears to be smaller. There is no surrounding tenderness. The area on the medial  leg also did not seem to be as healthy as last week, the reason for this regression this week is not totally clear. We have been using Sorbact for the last 4 [name]. We'll switched of polymen AG which we will order via home medical supply. If there is a problem with this would switch back to Iodoflex 01/15/18; drainage,odor. No change. Switched to polymen last week 01/22/17; still continuous drainage. Culture I did last week showed a few Proteus pansensitive. I did this culture because of drainage. Put him on Augmentin which she has been taking since Saturday however he is developed 4-5 liquid bowel movements. He is also on Coumadin. Beyond this wound is not changed at all, still nonviable necrotic surface material which I debrided reveals healthy granulation line 01/29/17; still copious amounts of drainage reported by her intake nurse. Wound measuring slightly smaller. Currently fact on Iodoflex although I'm looking forward to changing back to perhaps [facility] or polymen AG 02/05/17; still large amounts of drainage and presenting with really large amounts of adherent slough and necrotic material over the remaining open area of the wound. We have been using Iodoflex but with little improvement in the surface. [facility] 02/12/17; still large amount of drainage. Much less adherent slough however. Started [employer]	[place] last week [name], [name] ([id number]) 02/19/17; drainage is better this week. Much less adherent slough. Perhaps some improvement in dimensions. Using [facility] 02/26/17; severe venous insufficiency wounds on the right lateral and right medial leg. Drainage is some better and slough is less adherent we've been using Hydrofera Blue 03/05/17 on evaluation today patient appears to be doing well. His wounds have been decreasing in size and overall he is pleased with how this is progressing. We are awaiting approval for the epigraph which has previously been  recommended in the meantime the [facility] D[facility] is to be doing very well for him. 03/12/17; wound dimensions are smaller still using Hydrofera Blue. Comes in on Fridays for a dressing change. 03/19/17; wound dimensions continued to contract. Healing of this wound is complicated by continuous significant drainage as well as recurrent buildup of necrotic surface material. We looked into Apligraf and he has a $290 co-pay per application but truthfully I think the drainage as well as the nonviable surface would preclude use of Apligraf there are any other skin substitute at this point therefore the continued plan will be debridement each clinic visit, 2 times a week dressing changes and continued use of Hydrofera Blue. improvement has been very slow but sustained 03/26/17 perhaps slight improvements in peripheral epithelialization is especially inferiorly. Still with large amount of drainage and tightly adherent necrotic surface on arrival. Along with the intake nurse I reviewed previous treatment. He worsened on Iodoflex and had a 4 week trial of sorbact, Polymen AG and a long courses of Aquacel Ag. He is not a candidate for advanced treatment options for many reasons 04/09/17 on evaluation today patient's right lower extremity wounds appear to be doing a little better. Fortunately he has no significant discomfort and has  been tolerating the dressing changes including the wraps without complication. With that being said he really does not have swelling anymore compared to what he has had in the past following his vascular intervention. He wonders if potentially we could attempt avoiding the rats to see if he could cleanse the wound in between to try to prevent some of the fiber and its buildup that occurs in the interim between when we see him week to week. No fevers, chills, nausea, or vomiting noted at this time. 04/16/17; noted that the staff made a choice last week not to put him in  compression. The patient is changing his dressing at home using [facility] and changing every second day. His wife is washing the wound with saline. He is using kerlix. Surprisingly he has not developed a lot of edema. This choice was made because of the degree of fluid retention and maceration even with changing his dressing twice a week 04/23/17; absolutely no change. Using Hydrofera Blue. Recurrent tightly adherent nonviable surface material. This is been refractory to Iodoflex, sorbact and now Hydrofera Blue. More recently he has been changing his own dressings at home, cleansing the wound in the shower. He has not developed lower extremity edema 04/30/17; if anything the wound is larger still in adherent surface although it debrided easily today. I've been using Mehta honey for 1 week and I'd like to try and do it for a second week this see if we can get some form of viable surface here. 05/07/17; patient arrives with copious amounts of drainage and some pain in the superior part of the wound. He has not been systemically unwell. He's been changing this daily at home 05/14/17; patient arrives today complaining of less drainage and less pain. Dimensions slightly better. Surface culture I did of this last week grew MSSA I put him on dicloxacillin for 10 days. Patient requesting a further prescription since he feels so much better. He did not obtain the [facility] AG for reasons that aren't clear, he has been using Hydrofera Blue 05/21/17; perhaps some less drainage and less pain. He is completing another week of doxycycline. Unfortunately there is no change in the wound measurements are appearance still tightly adherent necrotic surface material that is really defied treatment. We have been using Hydrofera Blue most recently. He did not manage to get Anasept. He was told it was prescription at [facility] 05/29/17; absolutely no improvement in these wound areas. He had remote plastic surgery with who was  done at [facility] in [place] surrounding this area. This was a traumatic wound with extensive plastic surgery/skin grafting at that time. I switched him to sorbact last week to see if he can do anything about the recurrent necrotic surface and drainage. This is largely been refractory to Iodoflex/Hydrofera Blue/alginates. I believe we tried Elby Showers and more recently Medi honey l however the drainage is really too excessive 06/04/17; patient went to see Dr. Ulice Bold. Per the patient she ordered him a compression stocking. Continued the Anasept gel and sorbact was already on. No major improvement in the condition of the wounds in fact the medial wound looks larger. Again per the patient she did not feel a skin graft or operative debridement was indicated The patient had previous venous ablation in February 2018. Last arterial studies were in December 2017 and were within normal limits 06/18/17; patient arrives back in clinic today using Anasept gel with sorbact. He changes this every day is wearing his own compression stocking. The patient  actually saw Dr. Leta Baptist of plastic surgery. I've reviewed her note. The appointment was on 05/30/17. The basic issue with here was that she did not feel that skin grafts for patients with underlying stasis and edema have a good long-term success rate. She also discuss skin substitutes which has been done here as well however I have not been able to get the surface of these wounds to something that I think would support skin substitutes. This is why he felt he might need an [name], [name]. ([id number]) operative debridement more aggressively than I can do in this clinic Review of systems; is otherwise negative he feels well 07/02/17; patient has been using Anasept gel with Sorbact. He changes this every day scrubs out the wound beds. Arrives today with the wound bed looking some better. Easier debridement 07/16/17; patient has been using Anasept gel  was sorbact for about a month now. The necrotic service on his wound bed is better and the drainage is now down to minimal but we've not made any improvements in the epithelialization. Change to Santyl today. Surface of the wound is still not good enough to support skin substitutes 07/30/17 on evaluation today patient appears to be doing very well in regard to his right bimalleolus wounds. He has been tolerating the treatment with the Anasept gel and sorbact. However we were going to try and switch to Santyl to be more aggressive with these wounds. This was however cost prohibitive. Therefore we will likely go back to the sorbact. Nonetheless he is not having any significant discomfort he still is forming slough over the wound location but nothing too significant. The wounds do appear to be filling in nicely. 08/13/17; I have not seen this wound in about a month. There is not much change. The fact the area medially looks larger. He has been using sorbact and Anasept for over a month now without much effect. Arrives in clinic stating that he does not want the area debrided 08/28/17; patient arrives with the areas on his right medial and right lateral ankle worse. The wounds of expanded there is erythema and still drainage. There is no pain and no tenderness. We had been using Keracel AG clearly not doing well. 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 He states that even the evening that we began utilizing the Iodoflex he started having burning. Subsequently this never really improved and he eventually discontinue the use of the Iodoflex altogether. Unfortunately he did not let us know about any of this until today. I'm unsure of any  specific infection at this point although I am going to obtain a wound culture today in order to see if there's anything that could potentially be causing an infection as well. It does sound as if he's had the Iodoflex before and did not have this kind of reaction although this does sound very specific for a reaction to the Iodoflex. 09/17/17 on evaluation today patient appears to be doing significantly better compared to last week's evaluation. At that time it actually appears that he did have an infection the good news is that we have been able to get this under control with switching to the [facility] solution he's not having as much pain  and discomfort he still does have some erythema surrounding the medial aspect wound. He has been tolerating the Dakin's soaked gauze dressing which is excellent and that his wounds do not seem to have as much adherent slough. Overall I'm pleased with the progress she's made in last week. 11/05/17 on evaluation today patient appears to be doing better in regard to his right medial and lateral malleolus ulcers. He has been tolerating the dressing changes without complication. I do flight the Freada Bergeron is helping with additional granulation at this point in the wound beds do appear to be a little bit better. With that being said patient does not appear to have any evidence of significant infection at this point there is no erythema as he has previously noted he does note that the 30 day course of antibiotics I placed him on will end tomorrow. 11/12/17 on evaluation today patient actually appears to be doing excellent in regard to his right medial and lateral malleolus ulcers. He has been tolerating the dressing changes without complication. Fortunately he has no evidence of infection at this time and overall I believe he is progressing extremely nicely he has a lot of new epithelialization noted on evaluation today. Patient likewise is very pleased with the progress even just  since last week. 11/19/17 on evaluation today patient appears to be doing about the same in regard to his ulcerations. He does not have any additional breakdown although he really has not noted any significant improvement either over the past week. With that being said the more concerning thing at this time is that he is beginning to show signs of erythema surrounding both wounds which is what typically happens and then he will develop into a more overt infection. This has been the course for some time. I hope that doing the 30 day in a biotic cycle this past time would break that so that he would not have the same type of thing happen again. Nonetheless it appears that is digressing back into this infected state unfortunately. With that being said he is not having any pain currently which is good he typically does start to hurt more as this progresses. Fortunately there does not appear to be any evidence of infection systemically which is good news. 11/26/17 on evaluation today patient appears to actually be doing rather well in regard to his ulcer compared to last week. He still has your theme and noted around both the medial and lateral malleolus ulcers of the right lower extremity. He does not seem to be quite as overtly infected however. He has been tolerating the dressing changes without complication which is good news. The gentamicin cream does seem to have been of benefit for him. With that being said he does actually have an appointment with Dr. Sampson Goon this morning to see if there's anything that would be recommended in the way of IV antibiotic [name], Tyaire C. ([id number]) therapy. Otherwise he still has slough noted on the surface of the wound. 12/03/17 on evaluation today patient presents for follow-up concerning his ongoing right lower extremity ulcers. He has been tolerating the dressing changes without complication he states he has not really been using gentamicin on the lateral  ankle and this seems to be doing very well. For that reason I think that is probably fine to put a hold on the gentamicin he states his burns and stings when he applies it and we maybe be able to just use this intermittently when things become more irritated. Other  than that the good news is I did review his MRI which was performed on 11/28/17 and revealed that he has a negative MRI for abscess, osteomyelitis, or septic joint. Obviously these were the issues that I was concerned with the possibility of and I'm pleased to find that there is no problems in that regard. I did also review Dr. Jarrett Ables note from infectious disease he discussed performed some lab work which apparently was done and then subsequently was going to consider the possibility of Keflex long-term for prevention of infection although this has not been prescribed as of yet. 12/09/17 on evaluation today patient appears to be doing a little bit worse in regard to his right medial and lateral malleolus ulcers. Fortunately the wound does not seem to be significantly worse although he does have a lot more drainage especially the lateral aspect he is having a lot more pain than what he has noted more recently. Fortunately there does not appear to be any evidence of systemic infection which is good news. Again he has seen Dr. Sampson Goon but he has not been placed on the potential Keflex which was recommended as a possibility at least yet. 12/17/17 unfortunately on evaluation today the patient's wound appears to be doing much worse. He has been performing the dressing changes as directed. Upon a review of notes in epic it does appear that Dr. Jarrett Ables office specifically sending Keflex for the patient on the 16th which was last Tuesday. With that being said the patient states that though this was [facility] into [facility] that [facility] stated they never received it and subsequently the patient never took anything. He tells me he has been in bed  since Friday due to the increased pain and overall general malaise. No fevers, chills, nausea, or vomiting noted at this time. 12/31/17 on evaluation today patient appears to be doing very well in regard to his right [employer] ulcers. Definitely much better than when I last saw him. He is on the Keflex as prescribed by Dr. Sampson Goon at this point. Fortunately he shows no signs of infection significantly at this point he also tells me that he is having no pain which is much different from when I last saw him. Currently we are using a silver alginate dressing and gentamicin topically. 01/07/18 on evaluation today patient appears to be doing better overall in my pinion in regard to his wounds. He has been tolerating the dressing changes without complication and overall it seems that the Keflex has been of benefit for him. Patient is happy he states that he is not having pain like he has in the past in fact he's having no pain. 01/14/18 on evaluation today patient appears to be doing better in regard to his right malleolus ulcers. He has been tolerating the dressing changes without complication and in general I feel like things are making great improvement over the past several [name]. I do feel like him being on the Keflex has made a big improvement as well. Overall I'm pleased with how things are progressing. 01/21/18 on evaluation today patient appears to be doing well in regard to the medial ankle ulcer. In regard to his right lateral ulcer there is a little bit of erythema in the periwound region but this is extremely minimal. He is having a little discomfort in the inferior/posterior portion of the wound but again there is no erythema at the site. I'm not certain that he's really having any superinfection at this point although I definitely believe  he is having increased discomfort for that reason no sharp debridement will be done in regard to the lateral portion of his wound. 01/28/18 undervaluation  today patient appears to be doing excellent in regard to his right medial and lateral malleolus ulcer. He has been tolerating the dressing changes well complication overall he seems to be showing signs of good improvement which is excellent. I'm definitely feeling like he showing evidence of new skin growth week by week which is great news. Patient History Information obtained from Patient. Social History Former smoker - 10 years ago, Marital Status - Married, Alcohol Use - Moderate, Drug Use - No History, Caffeine Use - Never. Medical And Surgical History Notes Constitutional Symptoms (General Health) Vein Filter (groin area); CODA; H/O Blood Clots; pulmonary hypertensive arterial disease Chadderdon, Jazziel C. ([id number]) Review of Systems (ROS) Constitutional Symptoms (General Health) Denies complaints or symptoms of Fever, Chills. Respiratory The patient has no complaints or symptoms. Cardiovascular Complains or has symptoms of LE edema. Psychiatric The patient has no complaints or symptoms. Objective Constitutional Well-nourished and well-hydrated in no acute distress. Vitals Time Taken: 9:04 AM, Height: 76 in, Weight: 238 lbs, BMI: 29, Temperature: 98.2 F, Pulse: 68 bpm, Respiratory Rate: 16 breaths/min, Blood Pressure: 136/68 mmHg. Respiratory normal breathing without difficulty. Psychiatric this patient is able to make decisions and demonstrates good insight into disease process. Alert and Oriented x 3. pleasant and cooperative. General Notes: Fortunately he's not having any significant discomfort today and therefore was able to perform sharp debridement of both wound sites at this point. This post debridement appears to be doing excellent and he has lots of new areas of skin growth noted at this point in time. Overall I'm very pleased. Integumentary (Hair, Skin) Wound #1 status is Open. Original cause of wound was Gradually Appeared. The wound is located on the  Right,Lateral Malleolus. The wound measures 6.5cm length x 3cm width x 0.2cm depth; 15.315cm^2 area and 3.063cm^3 volume. There is Fat Layer (Subcutaneous Tissue) Exposed exposed. There is no tunneling or undermining noted. There is a medium amount of purulent drainage noted. The wound margin is distinct with the outline attached to the wound base. There is small (1-33%) pink granulation within the wound bed. There is a large (67-100%) amount of necrotic tissue within the wound bed including Adherent Slough. The periwound skin appearance exhibited: Scarring, Erythema. The periwound skin appearance did not exhibit: Callus, Crepitus, Excoriation, Induration, Rash, Dry/Scaly, Maceration, Atrophie Blanche, Cyanosis, Ecchymosis, Hemosiderin Staining, Mottled, Pallor, Rubor. The surrounding wound skin color is noted with erythema which is circumferential. Periwound temperature was noted as No Abnormality. The periwound has tenderness on palpation. Wound #2 status is Open. Original cause of wound was Gradually Appeared. The wound is located on the Right,Medial Malleolus. The wound measures 4cm length x 3cm width x 0.1cm depth; 9.425cm^2 area and 0.942cm^3 volume. There is no tunneling or undermining noted. There is a small amount of purulent drainage noted. The wound margin is distinct with the outline attached to the wound base. There is small (1-33%) red granulation within the wound bed. There is a large (67-100%) amount of necrotic tissue within the wound bed including Adherent Slough. The periwound skin appearance did not exhibit: Callus, Crepitus, Excoriation, Induration, Rash, Scarring, Dry/Scaly, Maceration, Atrophie Blanche, Cyanosis, Ecchymosis, Hemosiderin Staining, Mottled, Pallor, Rubor, Erythema. Periwound temperature was noted as No Abnormality. The EISEN, ROBENSON C. ([id number]) periwound has tenderness on palpation. 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 There was a Excisional Skin/Subcutaneous Tissue Debridement with a total area of 19.5 sq cm performed by STONE III, [name] E., PA-C. With the following instrument(s): Curette to remove Viable and Non-Viable tissue/material. Material removed includes Subcutaneous Tissue, Slough, and Fibrin/Exudate after achieving pain control using Lidocaine 4% Topical Solution. No specimens were taken. A time out was conducted at 09:39, prior to the start of the procedure. A Minimum amount of bleeding was controlled with Pressure. The procedure was tolerated well with a pain level of 0 throughout and a pain level of 0 following the procedure. Patient s Level of Consciousness post procedure was recorded as Awake and Alert. Post Debridement Measurements: 6.5cm length x 3cm width x 0.3cm depth; 4.595cm^3 volume. Character of Wound/Ulcer Post Debridement requires further debridement. Severity of Tissue Post Debridement is: Fat layer exposed. 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 There was a Excisional Skin/Subcutaneous Tissue Debridement with a total area of 12 sq cm performed by STONE III, [name] E., PA-C. With the following instrument(s): Curette to remove Viable and Non-Viable tissue/material. Material removed includes Subcutaneous Tissue,  Slough, and Fibrin/Exudate after achieving pain control using Lidocaine 4% Topical Solution. No specimens were taken. A time out was conducted at 09:39, prior to the start of the procedure. A Minimum amount of bleeding was controlled with Pressure. The procedure was tolerated well with a pain level of 0 throughout and a pain level of 0 following the procedure. Patient s Level of Consciousness post procedure was recorded as Awake and Alert. Post Debridement Measurements: 4cm length x 3cm width x 0.2cm depth; 1.885cm^3 volume. Character of Wound/Ulcer Post Debridement requires further debridement. Severity of Tissue Post Debridement is: Fat layer exposed. Post procedure Diagnosis Wound #2: Same as Pre-Procedure Plan Wound Cleansing: Wound #1 Right,Lateral Malleolus: Whitsell, Future C. ([id number]) Clean wound with Normal Saline. Cleanse wound with mild soap and water May Shower, gently pat wound dry prior to applying new dressing. Wound #2 Right,Medial Malleolus: Clean wound with Normal Saline. Cleanse wound with mild soap and water May Shower, gently pat wound dry prior to applying new dressing. Anesthetic (add to Medication List): Wound #1 Right,Lateral Malleolus: Topical Lidocaine 4% cream applied to wound bed prior to debridement (In Clinic Only). Wound #2 Right,Medial Malleolus: Topical Lidocaine 4% cream applied to wound bed prior to debridement (In Clinic Only). Skin Barriers/Peri-Wound Care: Wound #1 Right,Lateral Malleolus: Barrier cream Wound #2 Right,Medial Malleolus: Barrier cream Primary Wound Dressing: Wound #1 Right,Lateral Malleolus: Gentamicin Sulfate Cream - only on wound bed Silver Alginate - only on wound bed Wound #2 Right,Medial Malleolus: Gentamicin Sulfate Cream - only on wound bed Silver Alginate - only on wound bed Secondary Dressing: Wound #1 Right,Lateral Malleolus: Dry Gauze Conform/Kerlix Wound #2 Right,Medial Malleolus: Dry  Gauze Conform/Kerlix Dressing Change Frequency: Wound #1 Right,Lateral Malleolus: Change dressing every other day. Wound #2 Right,Medial Malleolus: Change dressing every other day. Follow-up Appointments: Wound #1 Right,Lateral Malleolus: Return Appointment in 1 week. Wound #2 Right,Medial Malleolus: Return Appointment  in 1 week. Edema Control: Wound #1 Right,Lateral Malleolus: Elevate legs to the level of the heart and pump ankles as often as possible Wound #2 Right,Medial Malleolus: Elevate legs to the level of the heart and pump ankles as often as possible Additional Orders / Instructions: Wound #1 Right,Lateral Malleolus: Increase protein intake. Wound #2 Right,Medial Malleolus: Increase protein intake. Medications-please add to medication list.: Wound #1 Right,Lateral Malleolus: Other: - Vitamin C, Zinc, MVI Wound #2 Right,Medial Malleolus: Other: - Vitamin C, Zinc, MVI The following medication(s) was prescribed: lidocaine topical 4 % cream 1 1 cream topical was prescribed at facility FREDIS, MALKIEWICZ ([id number]) I am gonna suggest at this point in time that we continue with the Current wound care measures for the next week he's in agreement with plan. We will see you where he stands in one [name] time we see him back for reevaluation. Hopefully this will continue to improve week by week. Please see above for specific wound care orders. We will see patient for re-evaluation in 1 week(s) here in the clinic. If anything worsens or changes patient will contact our office for additional recommendations. Electronic Signature(s) Signed: 01/29/2018 8:38:06 AM By: Lenda Kelp PA-C Entered By: Lenda Kelp on 01/28/2018 09:51:46 [name] ([id number]) -------------------------------------------------------------------------------- ROS/PFSH Details Patient Name: [name] Date of Service: 01/28/2018 9:00 AM Medical Record Number: [id number] Patient  Account Number: [id number] Date of Birth/Sex: 10-01-1945 (72 y.o. M) Treating RN: [name] Primary Care Provider: Darreld [name] Other Clinician: Referring Provider: Darreld [name] Treating Provider/Extender: STONE III, [name] in Treatment: 106 Information Obtained From Patient Wound History Do you currently have one or more open woundso Yes How many open wounds do you currently haveo 1 Approximately how long have you had your woundso 5 months How have you been treating your wound(s) until nowo saline, dressing Has your wound(s) ever healed and then re-openedo Yes Have you had any lab work done in the past montho No Have you tested positive for an antibiotic resistant organism (MRSA, VRE)o No Have you tested positive for osteomyelitis (bone infection)o No Have you had any tests for circulation on your legso No Constitutional Symptoms (General Health) Complaints and Symptoms: Negative for: Fever; Chills Medical History: Past Medical History Notes: Vein Filter (groin area); CODA; H/O Blood Clots; pulmonary hypertensive arterial disease Cardiovascular Complaints and Symptoms: Positive for: LE edema Medical History: Negative for: Angina; Arrhythmia; Congestive Heart Failure; Coronary Artery Disease; Deep Vein Thrombosis; Hypertension; Hypotension; Myocardial Infarction; Peripheral Arterial Disease; Peripheral Venous Disease; Phlebitis; Vasculitis Eyes Medical History: Positive for: Cataracts - removed Negative for: Glaucoma; Optic Neuritis Ear/Nose/Mouth/Throat Medical History: Negative for: Chronic sinus problems/congestion Hematologic/Lymphatic Medical History: Negative for: Anemia; Hemophilia; Human Immunodeficiency Virus; Lymphedema; Sickle Cell Disease Respiratory Poplin, Kyler C. ([id number]) Complaints and Symptoms: No Complaints or Symptoms Medical History: Positive for: Chronic Obstructive Pulmonary Disease (COPD) Negative for: Aspiration; Asthma;  Pneumothorax; Sleep Apnea; Tuberculosis Gastrointestinal Medical History: Negative for: Cirrhosis ; Colitis; Crohnos; Hepatitis A; Hepatitis B; Hepatitis C Endocrine Medical History: Negative for: Type I Diabetes; Type II Diabetes Genitourinary Medical History: Negative for: End Stage Renal Disease Immunological Medical History: Negative for: Lupus Erythematosus; Raynaudos Integumentary (Skin) Medical History: Negative for: History of Burn; History of pressure wounds Musculoskeletal Medical History: Positive for: Osteoarthritis Negative for: Gout; Rheumatoid Arthritis; Osteomyelitis Neurologic Medical History: Negative for: Dementia; Neuropathy; Quadriplegia; Paraplegia; Seizure Disorder Oncologic Medical History: Negative for: Received Chemotherapy; Received Radiation Psychiatric Complaints and Symptoms: No Complaints or Symptoms Medical History: Negative for:  Anorexia/bulimia; Confinement Anxiety HBO Extended History Items Eyes: Cataracts Griffy, Ines C. ([id number]) Immunizations Pneumococcal Vaccine: Received Pneumococcal Vaccination: No Implantable Devices Family and Social History Former smoker - 10 years ago; Marital Status - Married; Alcohol Use: Moderate; Drug Use: No History; Caffeine Use: Never; Advanced Directives: No; Patient does not want information on Advanced Directives; Living Will: No; Medical Power of Attorney: No Physician Affirmation I have reviewed and agree with the above information. Electronic Signature(s) Signed: 01/28/2018 5:20:44 PM By: Alejandro Mulling Signed: 01/29/2018 8:38:06 AM By: Lenda Kelp PA-C Entered By: Lenda Kelp on 01/28/2018 09:51:04 [name] ([id number]) -------------------------------------------------------------------------------- SuperBill Details Patient Name: [name] Date of Service: 01/28/2018 Medical Record Number: [id number] Patient Account Number: [id number] Date of Birth/Sex:  May 21, 1946 (72 y.o. 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, initial encounter T81.31XD Disruption of external operation (surgical) wound, not elsewhere classified, subsequent encounter Facility Procedures CPT4 Code: 08657846 Description: 11042 - DEB SUBQ TISSUE 20 SQ CM/< ICD-10 Diagnosis Description L97.312 Non-pressure chronic ulcer of right ankle with fat layer expo Modifier: sed Quantity: 1 CPT4 Code: 96295284 Description: 11045 - DEB SUBQ TISS EA ADDL 20CM ICD-10 Diagnosis Description L97.312 Non-pressure chronic ulcer of right ankle with fat layer expo Modifier: sed Quantity: 1 Physician Procedures CPT4 Code: 1324401 Description: 11042 - WC PHYS SUBQ TISS 20 SQ CM ICD-10 Diagnosis Description L97.312 Non-pressure chronic ulcer of right ankle with fat layer expos Modifier: ed Quantity: 1 CPT4 Code: 0272536 Description: 11045 - WC PHYS SUBQ TISS EA ADDL 20 CM ICD-10 Diagnosis Description L97.312 Non-pressure chronic ulcer of right ankle with fat layer expos Modifier: ed Quantity: 1 Electronic Signature(s) Signed: 01/29/2018 8:38:06 AM By: Lenda Kelp PA-C Entered By: Lenda Kelp on 01/28/2018 09:52:23

## 2018-01-29 NOTE — Progress Notes (Signed)
[name] ([id number]) Visit Report for 01/28/2018 Arrival Information Details Patient Name: [name], [name]. Date of Service: 01/28/2018 9:00 AM Medical Record Number: [id number] Patient Account Number: [id number] Date of Birth/Sex: 1945-09-10 (72 y.o. M) Treating RN: Rema Jasmine Primary Care Cashe Gatt: [name] Other Clinician: Referring Geraldean Walen: [name] Treating Mellony Danziger/Extender: Linwood Dibbles, HOYT Weeks in Treatment: 106 Visit Information History Since Last Visit Added or deleted any medications: No Patient Arrived: Ambulatory Any new allergies or adverse reactions: No Arrival Time: 09:01 Had a fall or experienced change in No Accompanied By: self activities of daily living that may affect Transfer Assistance: None risk of falls: Patient Requires Transmission-Based No Signs or symptoms of abuse/neglect since last visito No Precautions: Hospitalized since last visit: No Patient Has Alerts: Yes Implantable device outside of the clinic excluding No Patient Alerts: Patient on Blood cellular tissue based products placed in the center Thinner since last visit: Has Dressing in Place as Prescribed: Yes Pain Present Now: No Electronic Signature(s) Signed: 01/28/2018 11:57:38 AM By: Rema Jasmine Entered By: Rema Jasmine on 01/28/2018 09:02:22 [name] ([id number]) -------------------------------------------------------------------------------- Encounter Discharge Information Details Patient Name: [name] Date of Service: 01/28/2018 9:00 AM Medical Record Number: [id number] Patient Account Number: [id number] Date of Birth/Sex: 1946-08-05 (72 y.o. M) Treating RN: Renne Crigler Primary Care Tarissa Kerin: [name] Other Clinician: Referring Dereonna Lensing: [name] Treating Kikuye Korenek/Extender: Linwood Dibbles, HOYT Weeks in Treatment: 106 Encounter Discharge Information Items Discharge Condition: Stable Ambulatory Status: Ambulatory Discharge Destination:  Home Transportation: Private Auto Schedule Follow-up Appointment: Yes Clinical Summary of Care: Electronic Signature(s) Signed: 01/28/2018 12:56:26 PM By: Renne Crigler Entered By: Renne Crigler on 01/28/2018 10:05:28 [name] ([id number]) -------------------------------------------------------------------------------- Lower Extremity Assessment Details Patient Name: [name] Date of Service: 01/28/2018 9:00 AM Medical Record Number: [id number] Patient Account Number: [id number] Date of Birth/Sex: 14-Sep-1945 (72 y.o. M) Treating RN: Rema Jasmine Primary Care Jamy Whyte: [name] Other Clinician: Referring Celia Friedland: [name] Treating Tri Chittick/Extender: STONE III, HOYT Weeks in Treatment: 106 Edema Assessment Assessed: [Left: No] [Right: No] Edema: [Left: N] [Right: o] Calf Left: Right: Point of Measurement: 40 cm From Medial Instep cm 36.5 cm Ankle Left: Right: Point of Measurement: 16 cm From Medial Instep cm 22 cm Vascular Assessment Claudication: Claudication Assessment [Right:None] Pulses: Dorsalis Pedis Palpable: [Right:Yes] Posterior Tibial Extremity colors, hair growth, and conditions: Extremity Color: [Right:Hyperpigmented] Hair Growth on Extremity: [Right:No] Temperature of Extremity: [Right:Warm] Capillary Refill: [Right:< 3 seconds] Toe Nail Assessment Left: Right: Thick: Yes Discolored: Yes Deformed: Yes Improper Length and Hygiene: No Electronic Signature(s) Signed: 01/28/2018 11:57:38 AM By: Rema Jasmine Entered By: Rema Jasmine on 01/28/2018 09:17:25 [name] ([id number]) -------------------------------------------------------------------------------- Multi Wound Chart Details Patient Name: [name] Date of Service: 01/28/2018 9:00 AM Medical Record Number: [id number] Patient Account Number: [id number] Date of Birth/Sex: May 30, 1946 (72 y.o. M) Treating RN: [name] Primary Care Kiernan Atkerson: [name]  Other Clinician: Referring Kaydan Wilhoite: [name] Treating Leverne Tessler/Extender: STONE III, HOYT Weeks in Treatment: 106 Vital Signs Height(in): 76 Pulse(bpm): 68 Weight(lbs): 238 Blood Pressure(mmHg): 136/68 Body Mass Index(BMI): 29 Temperature(F): 98.2 Respiratory Rate 16 (breaths/min): Photos: [N/A:N/A] Wound Location: Right Malleolus - Lateral Right Malleolus - Medial N/A Wounding Event: Gradually Appeared Gradually Appeared N/A Primary Etiology: Venous Leg Ulcer Venous Leg Ulcer N/A Comorbid History: Cataracts, Chronic Obstructive Cataracts, Chronic Obstructive N/A Pulmonary Disease (COPD), Pulmonary Disease (COPD), Osteoarthritis Osteoarthritis Date Acquired: 08/11/2015 01/30/2016 N/A Weeks of Treatment: 106 104 N/A Wound Status: Open Open N/A Clustered Wound: No Yes N/A  Measurements L x W x D 6.5x3x0.2 4x3x0.1 N/A (cm) Area (cm) : 15.315 9.425 N/A Volume (cm) : 3.063 0.942 N/A % Reduction in Area: 11.40% 18.40% N/A % Reduction in Volume: 40.90% 18.40% N/A Classification: Full Thickness Without Full Thickness Without N/A Exposed Support Structures Exposed Support Structures Exudate Amount: Medium Small N/A Exudate Type: Purulent Purulent N/A Exudate Color: yellow, brown, green yellow, brown, green N/A Wound Margin: Distinct, outline attached Distinct, outline attached N/A Granulation Amount: Small (1-33%) Small (1-33%) N/A Granulation Quality: Pink Red N/A Necrotic Amount: Large (67-100%) Large (67-100%) N/A Exposed Structures: Fat Layer (Subcutaneous Fascia: No N/A Tissue) Exposed: Yes Fat Layer (Subcutaneous Fascia: No Tissue) Exposed: No Tendon: No Tendon: No Brueckner, Abeer C. ([id number]) Muscle: No Muscle: No Joint: No Joint: No Bone: No Bone: No Epithelialization: None None N/A Periwound Skin Texture: Scarring: Yes Excoriation: No N/A Excoriation: No Induration: No Induration: No Callus: No Callus: No Crepitus: No Crepitus: No Rash:  No Rash: No Scarring: No Periwound Skin Moisture: Maceration: No Maceration: No N/A Dry/Scaly: No Dry/Scaly: No Periwound Skin Color: Erythema: Yes Atrophie Blanche: No N/A Atrophie Blanche: No Cyanosis: No Cyanosis: No Ecchymosis: No Ecchymosis: No Erythema: No Hemosiderin Staining: No Hemosiderin Staining: No Mottled: No Mottled: No Pallor: No Pallor: No Rubor: No Rubor: No Erythema Location: Circumferential N/A N/A Temperature: No Abnormality No Abnormality N/A Tenderness on Palpation: Yes Yes N/A Wound Preparation: Ulcer Cleansing: Ulcer Cleansing: N/A Rinsed/Irrigated with Saline Rinsed/Irrigated with Saline Topical Anesthetic Applied: Topical Anesthetic Applied: Other: lidocAINE 4% Other: LIDOCAINE 4% Treatment Notes Electronic Signature(s) Signed: 01/28/2018 5:20:44 PM By: Alejandro Mulling Entered By: Alejandro Mulling on 01/28/2018 09:38:30 [name] ([id number]) -------------------------------------------------------------------------------- Multi-Disciplinary Care Plan Details Patient Name: [name] Date of Service: 01/28/2018 9:00 AM Medical Record Number: [id number] Patient Account Number: [id number] Date of Birth/Sex: 1946-07-09 (72 y.o. M) Treating RN: [name] Primary Care Giamarie Bueche: [name] Other Clinician: Referring Yezenia Fredrick: [name] Treating Ninoska Goswick/Extender: STONE III, HOYT Weeks in Treatment: 106 Active Inactive ` Venous Leg Ulcer Nursing Diagnoses: Knowledge deficit related to disease process and management Goals: Patient will maintain optimal edema control Date Initiated: 04/03/2016 Target Resolution Date: 11/24/2016 Goal Status: Active Interventions: Compression as ordered Treatment Activities: Therapeutic compression applied : 04/03/2016 Notes: ` Wound/Skin Impairment Nursing Diagnoses: Impaired tissue integrity Goals: Ulcer/skin breakdown will heal within 14 weeks Date Initiated:  01/17/2016 Target Resolution Date: 11/24/2016 Goal Status: Active Interventions: Assess ulceration(s) every visit Notes: Electronic Signature(s) Signed: 01/28/2018 5:20:44 PM By: Alejandro Mulling Entered By: Alejandro Mulling on 01/28/2018 09:38:14 [name] ([id number]) -------------------------------------------------------------------------------- Pain Assessment Details Patient Name: [name] Date of Service: 01/28/2018 9:00 AM Medical Record Number: [id number] Patient Account Number: [id number] Date of Birth/Sex: 11/27/1945 (73 y.o. M) Treating RN: Rema Jasmine Primary Care Sharona Rovner: [name] Other Clinician: Referring Clearance Chenault: [name] Treating Melinda Pottinger/Extender: STONE III, HOYT Weeks in Treatment: 106 Active Problems Location of Pain Severity and Description of Pain Patient Has Paino No Site Locations Pain Management and Medication Current Pain Management: Electronic Signature(s) Signed: 01/28/2018 11:57:38 AM By: Rema Jasmine Entered By: Rema Jasmine on 01/28/2018 09:04:05 [name] ([id number]) -------------------------------------------------------------------------------- Patient/Caregiver Education Details Patient Name: [name] Date of Service: 01/28/2018 9:00 AM Medical Record Number: [id number] Patient Account Number: [id number] Date of Birth/Gender: [birth year] (72 y.o. M) Treating RN: Renne Crigler Primary Care Physician: [name] Other Clinician: Referring Physician: [name] Treating Physician/Extender: Skeet Simmer in Treatment: 106 Education Assessment Education Provided To: Patient Education Topics Provided Wound Debridement:  Handouts: Wound Debridement Methods: Explain/Verbal Responses: State content correctly Wound/Skin Impairment: Methods: Explain/Verbal Responses: State content correctly Electronic Signature(s) Signed: 01/28/2018 12:56:26 PM By: Renne Crigler Entered By: Renne Crigler  on 01/28/2018 10:05:45 [name] ([id number]) -------------------------------------------------------------------------------- Wound Assessment Details Patient Name: [name] Date of Service: 01/28/2018 9:00 AM Medical Record Number: [id number] Patient Account Number: [id number] Date of Birth/Sex: 1946-04-07 (72 y.o. M) Treating RN: Rema Jasmine Primary Care Lori Popowski: [name] Other Clinician: Referring Shermon Bozzi: [name] Treating Bonnita Newby/Extender: STONE III, HOYT Weeks in Treatment: 106 Wound Status Wound Number: 1 Primary Venous Leg Ulcer Etiology: Wound Location: Right Malleolus - Lateral Wound Open Wounding Event: Gradually Appeared Status: Date Acquired: 08/11/2015 Comorbid Cataracts, Chronic Obstructive Pulmonary Weeks Of Treatment: 106 History: Disease (COPD), Osteoarthritis Clustered Wound: No Photos Photo Uploaded By: Rema Jasmine on 01/28/2018 09:28:03 Wound Measurements Length: (cm) 6.5 Width: (cm) 3 Depth: (cm) 0.2 Area: (cm) 15.315 Volume: (cm) 3.063 % Reduction in Area: 11.4% % Reduction in Volume: 40.9% Epithelialization: None Tunneling: No Undermining: No Wound Description Full Thickness Without Exposed Support Foul Odo Classification: Structures Slough/F Wound Margin: Distinct, outline attached Exudate Medium Amount: Exudate Type: Purulent Exudate Color: yellow, brown, green r After Cleansing: No ibrino Yes Wound Bed Granulation Amount: Small (1-33%) Exposed Structure Granulation Quality: Pink Fascia Exposed: No Necrotic Amount: Large (67-100%) Fat Layer (Subcutaneous Tissue) Exposed: Yes Necrotic Quality: Adherent Slough Tendon Exposed: No Muscle Exposed: No Joint Exposed: No Bone Exposed: No Kibler, Bernhard C. ([id number]) Periwound Skin Texture Texture Color No Abnormalities Noted: No No Abnormalities Noted: No Callus: No Atrophie Blanche: No Crepitus: No Cyanosis: No Excoriation: No Ecchymosis:  No Induration: No Erythema: Yes Rash: No Erythema Location: Circumferential Scarring: Yes Hemosiderin Staining: No Mottled: No Moisture Pallor: No No Abnormalities Noted: No Rubor: No Dry / Scaly: No Maceration: No Temperature / Pain Temperature: No Abnormality Tenderness on Palpation: Yes Wound Preparation Ulcer Cleansing: Rinsed/Irrigated with Saline Topical Anesthetic Applied: Other: lidocAINE 4%, Treatment Notes Wound #1 (Right, Lateral Malleolus) 1. Cleansed with: Clean wound with Normal Saline 2. Anesthetic Topical Lidocaine 4% cream to wound bed prior to debridement 3. Peri-wound Care: Other peri-wound care (specify in notes) 4. Dressing Applied: Other dressing (specify in notes) 5. Secondary Dressing Applied ABD Pad Notes gentamycin, silvercell, abd wrap with kerlix and tape with darkl offloading shoe Electronic Signature(s) Signed: 01/28/2018 11:57:38 AM By: Rema Jasmine Entered By: Rema Jasmine on 01/28/2018 09:12:52 [name] ([id number]) -------------------------------------------------------------------------------- Wound Assessment Details Patient Name: [name] Date of Service: 01/28/2018 9:00 AM Medical Record Number: [id number] Patient Account Number: [id number] Date of Birth/Sex: Feb 05, 1946 (72 y.o. M) Treating RN: Rema Jasmine Primary Care Deward Sebek: [name] Other Clinician: Referring Wyndell Cardiff: [name] Treating Armella Stogner/Extender: STONE III, HOYT Weeks in Treatment: 106 Wound Status Wound Number: 2 Primary Venous Leg Ulcer Etiology: Wound Location: Right Malleolus - Medial Wound Open Wounding Event: Gradually Appeared Status: Date Acquired: 01/30/2016 Comorbid Cataracts, Chronic Obstructive Pulmonary Weeks Of Treatment: 104 History: Disease (COPD), Osteoarthritis Clustered Wound: Yes Photos Photo Uploaded By: Rema Jasmine on 01/28/2018 09:28:03 Wound Measurements Length: (cm) 4 Width: (cm) 3 Depth: (cm) 0.1 Area: (cm)  9.425 Volume: (cm) 0.942 % Reduction in Area: 18.4% % Reduction in Volume: 18.4% Epithelialization: None Tunneling: No Undermining: No Wound Description Full Thickness Without Exposed Support Foul Odo Classification: Structures Slough/F Wound Margin: Distinct, outline attached Exudate Small Amount: Exudate Type: Purulent Exudate Color: yellow, brown, green r After Cleansing: No ibrino Yes Wound Bed Granulation Amount: Small (1-33%) Exposed Structure Granulation  Quality: Red Fascia Exposed: No Necrotic Amount: Large (67-100%) Fat Layer (Subcutaneous Tissue) Exposed: No Necrotic Quality: Adherent Slough Tendon Exposed: No Muscle Exposed: No Joint Exposed: No Bone Exposed: No Hann, Coe C. ([id number]) Periwound Skin Texture Texture Color No Abnormalities Noted: No No Abnormalities Noted: No Callus: No Atrophie Blanche: No Crepitus: No Cyanosis: No Excoriation: No Ecchymosis: No Induration: No Erythema: No Rash: No Hemosiderin Staining: No Scarring: No Mottled: No Pallor: No Moisture Rubor: No No Abnormalities Noted: No Dry / Scaly: No Temperature / Pain Maceration: No Temperature: No Abnormality Tenderness on Palpation: Yes Wound Preparation Ulcer Cleansing: Rinsed/Irrigated with Saline Topical Anesthetic Applied: Other: LIDOCAINE 4%, Treatment Notes Wound #2 (Right, Medial Malleolus) 1. Cleansed with: Clean wound with Normal Saline 2. Anesthetic Topical Lidocaine 4% cream to wound bed prior to debridement 3. Peri-wound Care: Other peri-wound care (specify in notes) 4. Dressing Applied: Other dressing (specify in notes) 5. Secondary Dressing Applied ABD Pad Notes gentamycin, silvercell, abd wrap with kerlix and tape with darkl offloading shoe Electronic Signature(s) Signed: 01/28/2018 11:57:38 AM By: Rema JasmineNg, Wendi Entered By: Rema JasmineNg, Wendi on 01/28/2018 09:14:19 [name] StackBLACKWELL, Brylee C.  ([id number]) -------------------------------------------------------------------------------- Vitals Details Patient Name: [name] StackBLACKWELL, Baldwin C. Date of Service: 01/28/2018 9:00 AM Medical Record Number: [id number] Patient Account Number: [id number] Date of Birth/Sex: 1945/11/27 67(71 y.o. M) Treating RN: Rema JasmineNg, Wendi Primary Care Jessen Siegman: Darreld McleanMILES, LINDA Other Clinician: Referring Ladd Cen: Darreld McleanMILES, LINDA Treating Jozy Mcphearson/Extender: STONE III, HOYT Weeks in Treatment: 106 Vital Signs Time Taken: 09:04 Temperature (F): 98.2 Height (in): 76 Pulse (bpm): 68 Weight (lbs): 238 Respiratory Rate (breaths/min): 16 Body Mass Index (BMI): 29 Blood Pressure (mmHg): 136/68 Reference Range: 80 - 120 mg / dl Electronic Signature(s) Signed: 01/28/2018 11:57:38 AM By: Rema JasmineNg, Wendi Entered ByRema Jasmine: Ng, Wendi on 01/28/2018 09:04:38

## 2018-02-04 ENCOUNTER — Encounter: Payer: Medicare HMO | Admitting: Physician Assistant

## 2018-02-04 DIAGNOSIS — L97312 Non-pressure chronic ulcer of right ankle with fat layer exposed: Secondary | ICD-10-CM | POA: Diagnosis not present

## 2018-02-07 NOTE — Progress Notes (Signed)
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 point in time. His edema continues to be controlled well with the 4-layer compression wrap and he really has not noted any odor at this point in time. He is tolerating the dressing changes when they are performed  well. 06/05/16 at this point in time today patient currently shows no interval signs or symptoms of local or systemic infection. Again his pain level he rates to be a 1 out of 10 at most and overall he tells me that generally this is not giving him much trouble. In fact he even feels maybe a little bit better than last week. We have continue with the 4-layer compression wrap in which she tolerates very well at this point. He is continuing to utilize the [place]. 06/12/16 I think there has been some progression in the status of both of these wounds over today again covered in a gelatinous surface. Has been using Hydrofera Blue.  We had used Iodoflex in the past I'm not sure if there was an issue other than changing to something that might progress towards closure faster 06/19/16; he did not tolerate the Flexeril last week secondary to pain and this was changed on Friday back to [facility] area he continues to have copious amounts of gelatinous surface slough which is think inhibiting the speed of healing this area 06/26/16 patient over the last week has utilized the Santyl to try to loosen up some of the tightly adherent slough that was noted on evaluation last week. The good news is he tells me that the medial malleoli region really does not bother him the right llateral malleoli region is more tender to palpation at this point in time especially in the central/inferior location. However it does appear that the Santyl has done his job to loosen up the adherent slough at this point in time. Fortunately he has no interval signs or symptoms of infection locally or systemically no purulent discharge noted. 07/03/16 at this point in time today patient's wounds appear to be significantly improved over the right medial and lateral malleolus locations. He has much less tenderness at this point in time and the wounds appear clean her although there is still adherent slough this is  sufficiently improved over what I saw last week. I still see no evidence of local infection. 07/10/16; continued gradual improvement in the right medial and lateral malleolus locations. The lateral is more substantial wound now divided into 2 by a rim of normal epithelialization. 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 He denies any complaints of pain or intolerance to compression. Both ulcers are smaller in size oriented today's measurements, both are covered with a softly adherent slough. 07/24/16; medial wound is smaller, lateral about the same although surface looks better. Still using Hydrofera Blue 07/31/16; arrives today complaining of pain in the lateral part of his foot. Nurse reports a lot more drainage. He has been using Hydrofera Blue. Switch to silver alginate today 08/03/2016 -- I was asked to see the patient was here for a nurse visit today. I understand he had a lot of pain in his right lower extremity and was having blisters on his right foot which have not been there before. Though he started on doxycycline he does not have blisters elsewhere on his body. I do not believe this is a drug allergy. also mentioned that there was a copious purulent discharge from the wound and clinically there is no evidence of cellulitis. 08/07/16; I note that the patient came in for his nurse check on Friday apparently with blisters on his toes on the right than a lot of swelling in his forefoot. He continued on the doxycycline that I had prescribed on 12/8. A culture was done of the lateral wound that showed a combination of a few Proteus and Pseudomonas. Doxycycline might of covered the Proteus but would be unlikely to cover the Pseudomonas. He is on Coumadin. He arrives in the clinic today feeling a lot better states the pain is a lot better but nothing specific really was  done other than to rewrap the foot also noted that he had arterial studies ordered in August although these were never done. It is reasonable to go ahead and reorder these. 08/14/16; generally arrives in a better state today in terms of the wounds he has taken cefdinir for one week.  Our intake nurse reports copious amounts of drainage but the patient is complaining of much less pain. He is not had his PT and INR checked and I've asked him to do this today or tomorrow. 08/24/2016 -- patient arrives today after 10 days and said he had a stomach upset. His arterial study was done and I have reviewed this report and find it to be within normal limits. However I did not note any venous duplex studies for reflux, and Dr. Leanord Hawking may have ordered these in the past but I will leave it to him to decide if he needs these. The patient has finished his course of cefdinir. 08/28/16; patient arrives today again with copious amounts of thick really green drainage for our intake nurse. He states he has a very tender spot at the superior part of the lateral wound. Wounds are larger 09/04/16; no real change in the condition of this patient's wound still copious amounts of surface slough. Started him on Iodoflex last week he is completing another course of Cefdinir or which I think was done empirically. His arterial study showed ABIs were 1.1 on the right 1.5 on the left. He did have a slightly reduced ABI in the right the left one was not obtained. Had calcification of the right posterior tibial artery. The interpretation was no segmental stenosis. His waveforms were triphasic. [name], [name] ([id number]) His reflux studies are later this month. Depending on this I'll send him for a vascular consultation, he may need to see plastic surgery as I believe he is had plastic surgery on this foot in the past. He had an injury to the foot in the 1980s. 1/16 /18 right lateral greater than right medial ankle wounds on the  right in the setting of previous skin grafting. 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 He does not have arterial issues. Each week he comes in with the same adherent surface slough although there was less of this today 09/18/16; right lateral greater than right medial lower extremity wounds in the setting of previous skin grafting and trauma. He has least to vein laser ablation scheduled for February 2 for venous reflux. He does not have significant arterial disease. Problem has been very difficult to handle surface slough/necrotic tissue. Recently using Iodoflex for this with some, albeit slow improvement 09/25/16; right lateral greater than right medial lower extremity venous wounds in the setting of previous skin grafting. He is going for ablation surgery on February 2 after this he'll come back here for rewrap. He has been using Iodoflex as the primary dressing. 10/02/16; right lateral greater than right medial lower extremity wounds in the setting of previous skin grafting. He had his ablation surgery last week, I don't have a report. He tolerated this well. Came in with a thigh-high Unna boots on Friday. We have been using Iodoflex as the primary dressing. His measurements are improving 10/09/16; continues to make nice aggressive in terms of the wounds on his lateral and medial right ankle in the setting of previous skin grafting. Yesterday he noticed drainage at one of his surgical sites from his venous ablation on the right calf. He took off the bandage over this area felt a "popping" sensation and a reddish-brown drainage. He is not complaining of any pain 10/16/16; he continues to make nice progression in terms of the wounds on the lateral and medial malleolus. Both smaller  using Iodoflex. He had a surgical area in his posterior mid calf we have been using iodoform. All the wounds are down  and dimensions 10/23/16; the patient arrives today with no complaints. He states the Iodoflex is a bit uncomfortable. He is not systemically unwell. We have been using Iodoflex to the lateral right ankle and the medial and Aquacel Ag to the reflux surgical wound on the posterior right calf. All of these wounds are doing well 10/30/16; patient states he has no pain no systemic symptoms. I changed him to [facility] last week. Although the wounds are doing well 11/06/16; patient reports no pain or systemic symptoms. We continue with Hydrofera Blue. Both wound areas on the medial and lateral ankle appear to be doing well with improvement and dimensions and improvement in the wound bed. 11/13/16; patient's dimensions continued to improve. We continue with Hydrofera Blue on the medial and lateral side. Appear to be doing well with healthy granulation and advancing epithelialization 11/20/16; patient's dimensions improving laterally by about half a centimeter in length. Otherwise no change on the medial side. Using [facility] 12/04/16; no major change in patient's wound dimensions. Intake nurse reports more drainage. 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 He is voicing no complaints or concerns. He has been tolerating his twice weekly compression therapy changes 12/11/16 Patient complains of pain and increased drainage.. wants hydrofera blue 12/18/16 improvement. Sorbact 12/25/16; medial wound is smaller, lateral measures the same. Still on sorbact 01/01/17; medial wound continues to be smaller, lateral measures about the same however there is clearly advancing epithelialization here as well in fact I think the wound will ultimately divided into 2 open areas 01/08/17; unfortunately today fairly significant regression in several areas. Surface of the lateral wound covered again in adherent  necrotic material which is difficult to debridement. He has significant surrounding skin maceration. The expanding area of tissue epithelialization in the middle of the wound that was encouraging last week appears to be smaller. There is no surrounding tenderness. The area on the medial leg also did not seem to be as healthy as last week, the reason for this regression this week is not totally clear. We have been using Sorbact for the last 4 weeks. We'll switched of polymen AG which we will order via home medical supply. If there is a problem with this would switch back to Iodoflex 01/15/18; drainage,odor. No change. Switched to polymen last week 01/22/17; still continuous drainage. Culture I did last week showed a few Proteus pansensitive. I did this culture because of drainage. Put him on Augmentin which she has been taking since Saturday however he is developed 4-5 liquid bowel movements. He is also on Coumadin. Beyond this wound is not changed at all, still nonviable necrotic surface material which I debrided reveals healthy granulation line 01/29/17; still copious amounts of drainage reported by her intake nurse. Wound measuring slightly smaller. Currently fact on Iodoflex although I'm looking forward to changing back to perhaps [facility] or polymen AG 02/05/17; still large amounts of drainage and presenting with really large amounts of adherent slough and necrotic material over the remaining open area of the wound. We have been using Iodoflex but with little improvement in the surface. Change to [facility] 02/12/17; still large amount of drainage. Much less adherent slough however. Started [employer]	[name] [place] last week JENNER, ROSIER ([id number]) 02/19/17; drainage is better this week. Much  less adherent slough. Perhaps some improvement in dimensions. Using [facility] 02/26/17; severe venous insufficiency wounds on the right lateral and right medial leg. Drainage is some better and  slough is less adherent we've been using Hydrofera Blue 03/05/17 on evaluation today patient appears to be doing well. His wounds have been decreasing in size and overall he is pleased with how this is progressing. We are awaiting approval for the epigraph which has previously been recommended in the meantime the [facility] Dressing is to be doing very well for him. 03/12/17; wound dimensions are smaller still using Hydrofera Blue. Comes in on Fridays for a dressing change. 03/19/17; wound dimensions continued to contract. Healing of this wound is complicated by continuous significant drainage as well as recurrent buildup of necrotic surface material. We looked into Apligraf and he has a $290 co-pay per application but truthfully I think the drainage as well as the nonviable surface would preclude use of Apligraf there are any other skin substitute at this point therefore the continued plan will be debridement each clinic visit, 2 times a week dressing changes and continued use of Hydrofera Blue. improvement has been very slow but sustained 03/26/17 perhaps slight improvements in peripheral epithelialization is especially inferiorly. Still with large amount of drainage and tightly adherent necrotic surface on arrival. Along with the intake nurse I reviewed previous treatment. He worsened on Iodoflex and had a 4 week trial of sorbact, Polymen AG and a long courses of Aquacel Ag. He is not a candidate for advanced treatment options for many reasons 04/09/17 on evaluation today patient's right lower extremity wounds appear to be doing a little better. Fortunately he has no significant discomfort and has been tolerating the dressing changes including the wraps without complication. With that being said he really does not have swelling anymore compared to what he has had in the past following his vascular intervention. He wonders if potentially we could attempt avoiding the rats to see if he could  cleanse the wound in between to try to prevent some of the fiber and its buildup that occurs in the interim between when we see him week to week. No fevers, chills, nausea, or vomiting noted at this time. 04/16/17; noted that the staff made a choice last week not to put him in compression. The patient is changing his dressing at home using [facility] and changing every second day. His wife is washing the wound with saline. He is using kerlix. Surprisingly he has not developed a lot of edema. This choice was made because of the degree of fluid retention and maceration even with changing his dressing twice a week 04/23/17; absolutely no change. Using Hydrofera Blue. Recurrent tightly adherent nonviable surface material. This is been refractory to Iodoflex, sorbact and now Hydrofera Blue. More recently he has been changing his own dressings at home, cleansing the wound in the shower. He has not developed lower extremity edema 04/30/17; if anything the wound is larger still in adherent surface although it debrided easily today. I've been using Mehta honey for 1 week and I'd like to try and do it for a second week this see if we can get some form of viable surface here. 05/07/17; patient arrives with copious amounts of drainage and some pain in the superior part of the wound. He has not been systemically unwell. He's been changing this daily at home 05/14/17; patient arrives today complaining of less drainage and less pain. Dimensions slightly better. Surface culture I did of  this last week grew MSSA I put him on dicloxacillin for 10 days. Patient requesting a further prescription since he feels so much better. He did not obtain the [facility] for reasons that aren't clear, he has been using Hydrofera Blue 05/21/17; perhaps some less drainage and less pain. He is completing another week of doxycycline. Unfortunately there is no change in the wound measurements are appearance still tightly adherent  necrotic surface material that is really defied treatment. We have been using Hydrofera Blue most recently. He did not manage to get Anasept. He was told it was prescription at [facility] 05/29/17; absolutely no improvement in these wound areas. He had remote plastic surgery with who was done at [facility] in [place] surrounding this area. This was a traumatic wound with extensive plastic surgery/skin grafting at that time. I switched him to sorbact last week to see if he can do anything about the recurrent necrotic surface and drainage. This is largely been refractory to Iodoflex/Hydrofera Blue/alginates. I believe we tried Elby Showers and more recently Medi honey l however the drainage is really too excessive 06/04/17; patient went to see Dr. Ulice Bold. Per the patient she ordered him a compression stocking. Continued the Anasept gel and sorbact was already on. No major improvement in the condition of the wounds in fact the medial wound looks larger. Again per the patient she did not feel a skin graft or operative debridement was indicated The patient had previous venous ablation in February 2018. Last arterial studies were in December 2017 and were within normal limits 06/18/17; patient arrives back in clinic today using Anasept gel with sorbact. He changes this every day is wearing his own compression stocking. The patient actually saw Dr. Leta Baptist of plastic surgery. I've reviewed her note. The appointment was on 05/30/17. The basic issue with here was that she did not feel that skin grafts for patients with underlying stasis and edema have a good long-term success rate. She also discuss skin substitutes which has been done here as well however I have not been able to get the surface of these wounds to something that I think would support skin substitutes. This is why he felt he might need an [name], [name]. ([id number]) operative debridement more aggressively than I can do in this  clinic Review of systems; is otherwise negative he feels well 07/02/17; patient has been using Anasept gel with Sorbact. He changes this every day scrubs out the wound beds. Arrives today with the wound bed looking some better. Easier debridement 07/16/17; patient has been using Anasept gel was sorbact for about a month now. The necrotic service on his wound bed is better and the drainage is now down to minimal but we've not made any improvements in the epithelialization. Change to Santyl today. Surface of the wound is still not good enough to support skin substitutes 07/30/17 on evaluation today patient appears to be doing very well in regard to his right bimalleolus wounds. He has been tolerating the treatment with the Anasept gel and sorbact. However we were going to try and switch to Santyl to be more aggressive with these wounds. This was however cost prohibitive. Therefore we will likely go back to the sorbact. Nonetheless he is not having any significant discomfort he still is forming slough over the wound location but nothing too significant. The wounds do appear to be filling in nicely. 08/13/17; I have not seen this wound in about a month. There is not much change. The fact the area  medially looks larger. He has been using sorbact and Anasept for over a month now without much effect. Arrives in clinic stating that he does not want the area debrided 08/28/17; patient arrives with the areas on his right medial and right lateral ankle worse. The wounds of expanded there is erythema and still drainage. There is no pain and no tenderness. We had been using Keracel AG clearly not doing well. 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  He states that even the evening that we began utilizing the Iodoflex he started having burning. Subsequently this never really improved and he eventually discontinue the use of the Iodoflex altogether. Unfortunately he did not let us know about any of this until today. I'm unsure of any specific infection at this point although I am going to obtain a wound culture today in order to see if there's anything that could potentially be causing an infection as well. It does sound as if he's had the Iodoflex before and did not have this kind of reaction although this does sound very specific for a reaction to the Iodoflex. 09/17/17 on evaluation today patient appears to be doing significantly better compared to last week's evaluation. At that time it actually appears that he did have an infection the good news is that we have been able to get this under control with switching to the [facility] solution he's not having as much pain and discomfort he still does have some erythema surrounding the medial aspect wound. He has been tolerating the Dakin's soaked gauze dressing which is excellent and that his wounds do not seem to have as much adherent slough. Overall I'm pleased with the progress she's made in last week. 11/05/17 on evaluation today patient appears to be doing better in regard to his right medial and lateral malleolus ulcers. He has been tolerating the dressing changes without complication. I do flight the Freada Bergeron is helping with additional granulation at this point in the wound beds do appear to be a little bit better. With that being said patient does not appear to have any evidence of significant infection at this point there is no erythema as he has previously noted he does note that the 30 day course of antibiotics I placed him on will end tomorrow. 11/12/17 on evaluation today patient actually appears to be doing excellent in regard to his right medial and lateral malleolus ulcers. He has been  tolerating the dressing changes without complication. Fortunately he has no evidence of infection at this time and overall I believe he is progressing extremely nicely he has a lot of new epithelialization noted on evaluation today. Patient likewise is very pleased with the progress even just since last week. 11/19/17 on evaluation today patient appears to be doing about the same in regard to his ulcerations. He does not have any additional breakdown although he really has not noted any significant improvement either over the past week. With that being said the more concerning thing at this time is that he is beginning to show signs of erythema surrounding both wounds which is what typically happens and then he will develop into a more overt infection. This has been the course for some time. I hope that  doing the 30 day in a biotic cycle this past time would break that so that he would not have the same type of thing happen again. Nonetheless it appears that is digressing back into this infected state unfortunately. With that being said he is not having any pain currently which is good he typically does start to hurt more as this progresses. Fortunately there does not appear to be any evidence of infection systemically which is good news. 11/26/17 on evaluation today patient appears to actually be doing rather well in regard to his ulcer compared to last week. He still has your theme and noted around both the medial and lateral malleolus ulcers of the right lower extremity. He does not seem to be quite as overtly infected however. He has been tolerating the dressing changes without complication which is good news. The gentamicin cream does seem to have been of benefit for him. With that being said he does actually have an appointment with Dr. Sampson Goon this morning to see if there's anything that would be recommended in the way of IV antibiotic [name], [name] therapy. Otherwise he  still has slough noted on the surface of the wound. 12/03/17 on evaluation today patient presents for follow-up concerning his ongoing right lower extremity ulcers. He has been tolerating the dressing changes without complication he states he has not really been using gentamicin on the lateral ankle and this seems to be doing very well. For that reason I think that is probably fine to put a hold on the gentamicin he states his burns and stings when he applies it and we maybe be able to just use this intermittently when things become more irritated. Other than that the good news is I did review his MRI which was performed on 11/28/17 and revealed that he has a negative MRI for abscess, osteomyelitis, or septic joint. Obviously these were the issues that I was concerned with the possibility of and I'm pleased to find that there is no problems in that regard. I did also review Dr. Jarrett Ables note from infectious disease he discussed performed some lab work which apparently was done and then subsequently was going to consider the possibility of Keflex long-term for prevention of infection although this has not been prescribed as of yet. 12/09/17 on evaluation today patient appears to be doing a little bit worse in regard to his right medial and lateral malleolus ulcers. Fortunately the wound does not seem to be significantly worse although he does have a lot more drainage especially the lateral aspect he is having a lot more pain than what he has noted more recently. Fortunately there does not appear to be any evidence of systemic infection which is good news. Again he has seen Dr. Sampson Goon but he has not been placed on the potential Keflex which was recommended as a possibility at least yet. 12/17/17 unfortunately on evaluation today the patient's wound appears to be doing much worse. He has been performing the dressing changes as directed. Upon a review of notes in epic it does appear that Dr.  Jarrett Ables office specifically sending Keflex for the patient on the 16th which was last Tuesday. With that being said the patient states that though this was sent into [facility] that [facility] stated they never received it and subsequently the patient never took anything. He tells me he has been in bed since Friday due to the increased pain and overall general malaise. No fevers, chills, nausea, or vomiting noted at this time.  02/04/18 undervaluation today patient appears to actually be doing very well in regard to his right lateral and medial malleolus ulcers. He has been tolerating the dressing changes without complication. Overall especially the lateral malleolus ulcer is showing signs of being significantly smaller compared to previous evaluation. Overall I think she's making great progress at this point. Patient History Information obtained from Patient. Social History Former smoker - 10 years ago, Marital Status - Married, Alcohol Use - Moderate, Drug Use - No History, Caffeine Use - Never. Medical And Surgical History Notes Constitutional Symptoms (General Health) Vein Filter (groin area); CODA; H/O Blood Clots; pulmonary hypertensive arterial disease Review of Systems (ROS) Constitutional Symptoms (General Health) Denies complaints or symptoms of Fever, Chills. Respiratory The patient has no complaints or symptoms. Cardiovascular The patient has no complaints or symptoms. Psychiatric The patient has no complaints or symptoms. DEZMOND, DOWNIE ([id number]) Objective Constitutional Well-nourished and well-hydrated in no acute distress. Vitals Time Taken: 9:34 AM, Height: 76 in, Weight: 238 lbs, BMI: 29, Temperature: 97.9 F, Pulse: 64 bpm, Respiratory Rate: 18 breaths/min, Blood Pressure: 133/54 mmHg. Respiratory normal breathing without difficulty. clear to auscultation bilaterally. Psychiatric this patient is able to make decisions and demonstrates good insight into  disease process. Alert and Oriented x 3. pleasant and cooperative. General Notes: Patient's wound at this point did have slough covering the surface of the wound bed and in general he tolerated debridement very well today to clean this away. Post debridement the wound beds both medial and lateral both appear to be doing very well. There is no evidence of significant infection which is good news. Integumentary (Hair, Skin) Wound #1 status is Open. Original cause of wound was Gradually Appeared. The wound is located on the Right,Lateral Malleolus. The wound measures 5.5cm length x 3cm width x 0.2cm depth; 12.959cm^2 area and 2.592cm^3 volume. There is Fat Layer (Subcutaneous Tissue) Exposed exposed. There is no tunneling or undermining noted. There is a medium amount of purulent drainage noted. The wound margin is distinct with the outline attached to the wound base. There is small (1-33%) pink granulation within the wound bed. There is a large (67-100%) amount of necrotic tissue within the wound bed including Adherent Slough. The periwound skin appearance exhibited: Scarring, Erythema. The periwound skin appearance did not exhibit: Callus, Crepitus, Excoriation, Induration, Rash, Dry/Scaly, Maceration, Atrophie Blanche, Cyanosis, Ecchymosis, Hemosiderin Staining, Mottled, Pallor, Rubor. The surrounding wound skin color is noted with erythema which is circumferential. Periwound temperature was noted as No Abnormality. The periwound has tenderness on palpation. Wound #2 status is Open. Original cause of wound was Gradually Appeared. The wound is located on the Right,Medial Malleolus. The wound measures 3.9cm length x 3cm width x 0.1cm depth; 9.189cm^2 area and 0.919cm^3 volume. There is no tunneling or undermining noted. There is a small amount of purulent drainage noted. The wound margin is distinct with the outline attached to the wound base. There is small (1-33%) red granulation within the wound  bed. There is a large (67-100%) amount of necrotic tissue within the wound bed including Adherent Slough. The periwound skin appearance did not exhibit: Callus, Crepitus, Excoriation, Induration, Rash, Scarring, Dry/Scaly, Maceration, Atrophie Blanche, Cyanosis, Ecchymosis, Hemosiderin Staining, Mottled, Pallor, Rubor, Erythema. Periwound temperature was noted as No Abnormality. The periwound has tenderness on palpation. 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 ([id number]) Procedures Wound #1 Pre-procedure diagnosis of Wound #1 is a Venous Leg Ulcer located on the Right,Lateral Malleolus .Severity of Tissue Pre Debridement is: Fat layer exposed. There was a Excisional Skin/Subcutaneous Tissue Debridement with a total area of 16.5 sq cm performed by STONE III, HOYT E., PA-C. With the following instrument(s): Curette to remove Viable and Non-Viable tissue/material. Material removed includes Subcutaneous Tissue, Slough, and Fibrin/Exudate after achieving pain control using Lidocaine 4% Topical Solution. No specimens were taken. A time out was conducted at 09:52, prior to the start of the procedure. A Minimum amount of bleeding was controlled with Pressure. The procedure was tolerated well with a pain level of 0 throughout and a pain level of 0 following the procedure. Patient s Level of Consciousness post procedure was recorded as Awake and Alert. Post Debridement Measurements: 5.5cm length x 3cm width x 0.3cm depth; 3.888cm^3 volume. Character of Wound/Ulcer Post Debridement requires further debridement. Severity of Tissue Post  Debridement is: Fat layer exposed. 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 There was a Excisional Skin/Subcutaneous Tissue Debridement with a total area of 11.7 sq cm performed by STONE III, HOYT E., PA-C. With the following instrument(s): Curette to remove Viable and Non-Viable tissue/material. Material removed includes Subcutaneous Tissue, Slough, and Fibrin/Exudate after achieving pain control using Lidocaine 4% Topical Solution. No specimens were taken. A time out was conducted at 09:52, prior to the start of the procedure. A Minimum amount of bleeding was controlled with Pressure. The procedure was tolerated well with a pain level of 0 throughout and a pain level of 0 following the procedure. Patient s Level of Consciousness post procedure was recorded as Awake and Alert. Post Debridement Measurements: 3.9cm length x 3cm width x 0.2cm depth; 1.838cm^3 volume. Character of Wound/Ulcer Post Debridement requires further debridement. Severity of Tissue Post Debridement is: Fat layer exposed. Post procedure Diagnosis Wound #2: Same as Pre-Procedure Plan Wound Cleansing: Wound #1 Right,Lateral Malleolus: Clean wound with Normal Saline. Cleanse wound with mild soap and water May Shower, gently pat wound dry prior to applying new dressing. Wound #2 Right,Medial Malleolus: Clean wound with Normal Saline. Cleanse wound with mild soap and water May Shower, gently pat wound dry prior to applying new dressing. Anesthetic (add to Medication List): Wound #1 Right,Lateral Malleolus: Topical Lidocaine 4% cream applied to wound bed prior to debridement (In Clinic Only). Wound #2 Right,Medial Malleolus: Topical Lidocaine 4% cream applied to wound bed prior to debridement (In Clinic Only). Skin Barriers/Peri-Wound  Care: Wound #1 Right,Lateral Malleolus: Barrier cream Wound #2 Right,Medial Malleolus: Barrier cream Primary Wound Dressing: Wound #1 Right,Lateral Malleolus: [name], [name]. ([id number]) Gentamicin Sulfate Cream - only on wound bed Silver Alginate - only on wound bed Wound #2 Right,Medial Malleolus: Gentamicin Sulfate Cream - only on wound bed Silver Alginate - only on wound bed Secondary Dressing: Wound #1 Right,Lateral Malleolus: Dry Gauze Conform/Kerlix Wound #2 Right,Medial Malleolus: Dry Gauze Conform/Kerlix Dressing Change Frequency: Wound #1 Right,Lateral Malleolus: Change dressing every other day. Wound #2 Right,Medial Malleolus: Change dressing every other day. Follow-up Appointments: Wound #1 Right,Lateral Malleolus: Return Appointment in 1 week. Wound #2 Right,Medial Malleolus: Return Appointment in 1 week. Edema Control: Wound #1 Right,Lateral Malleolus: Elevate legs to the level of the heart and  pump ankles as often as possible Wound #2 Right,Medial Malleolus: Elevate legs to the level of the heart and pump ankles as often as possible Additional Orders / Instructions: Wound #1 Right,Lateral Malleolus: Increase protein intake. Wound #2 Right,Medial Malleolus: Increase protein intake. Medications-please add to medication list.: Wound #1 Right,Lateral Malleolus: Other: - Vitamin C, Zinc, MVI Wound #2 Right,Medial Malleolus: Other: - Vitamin C, Zinc, MVI The following medication(s) was prescribed: lidocaine topical 4 % cream 1 1 cream topical was prescribed at facility I am going to suggest currently that we go ahead and continue with the current wound care measures since he seems to be tolerating this well and showing signs of good improvement. Patient is in agreement the plan. Please see above for specific wound care orders. We will see patient for re-evaluation in 1 week(s) here in the clinic. If anything worsens or changes patient will contact our  office for additional recommendations. Electronic Signature(s) Signed: 02/04/2018 5:32:46 PM By: Lenda Kelp PA-C Entered By: Lenda Kelp on 02/04/2018 10:10:11 [name] ([id number]) -------------------------------------------------------------------------------- ROS/PFSH Details Patient Name: [name] Date of Service: 02/04/2018 9:30 AM Medical Record Number: [id number] Patient Account Number: [id number] Date of Birth/Sex: 1946/01/06 (72 y.o. M) Treating RN: [name] Primary Care Provider: [name] Other Clinician: Referring Provider: [name] Treating Provider/Extender: STONE III, HOYT Weeks in Treatment: 107 Information Obtained From Patient Wound History Do you currently have one or more open woundso Yes How many open wounds do you currently haveo 1 Approximately how long have you had your woundso 5 months How have you been treating your wound(s) until nowo saline, dressing Has your wound(s) ever healed and then re-openedo Yes Have you had any lab work done in the past montho No Have you tested positive for an antibiotic resistant organism (MRSA, VRE)o No Have you tested positive for osteomyelitis (bone infection)o No Have you had any tests for circulation on your legso No Constitutional Symptoms (General Health) Complaints and Symptoms: Negative for: Fever; Chills Medical History: Past Medical History Notes: Vein Filter (groin area); CODA; H/O Blood Clots; pulmonary hypertensive arterial disease Eyes Medical History: Positive for: Cataracts - removed Negative for: Glaucoma; Optic Neuritis Ear/Nose/Mouth/Throat Medical History: Negative for: Chronic sinus problems/congestion Hematologic/Lymphatic Medical History: Negative for: Anemia; Hemophilia; Human Immunodeficiency Virus; Lymphedema; Sickle Cell Disease Respiratory Complaints and Symptoms: No Complaints or Symptoms Medical History: Positive for: Chronic Obstructive  Pulmonary Disease (COPD) Negative for: Aspiration; Asthma; Pneumothorax; Sleep Apnea; Tuberculosis Cardiovascular DAYLIN, GRUSZKA. ([id number]) Complaints and Symptoms: No Complaints or Symptoms Medical History: Negative for: Angina; Arrhythmia; Congestive Heart Failure; Coronary Artery Disease; Deep Vein Thrombosis; Hypertension; Hypotension; Myocardial Infarction; Peripheral Arterial Disease; Peripheral Venous Disease; Phlebitis; Vasculitis Gastrointestinal Medical History: Negative for: Cirrhosis ; Colitis; Crohnos; Hepatitis A; Hepatitis B; Hepatitis C Endocrine Medical History: Negative for: Type I Diabetes; Type II Diabetes Genitourinary Medical History: Negative for: End Stage Renal Disease Immunological Medical History: Negative for: Lupus Erythematosus; Raynaudos Integumentary (Skin) Medical History: Negative for: History of Burn; History of pressure wounds Musculoskeletal Medical History: Positive for: Osteoarthritis Negative for: Gout; Rheumatoid Arthritis; Osteomyelitis Neurologic Medical History: Negative for: Dementia; Neuropathy; Quadriplegia; Paraplegia; Seizure Disorder Oncologic Medical History: Negative for: Received Chemotherapy; Received Radiation Psychiatric Complaints and Symptoms: No Complaints or Symptoms Medical History: Negative for: Anorexia/bulimia; Confinement Anxiety HBO Extended History Items RANIER, COACH. ([id number]) Eyes: Cataracts Immunizations Pneumococcal Vaccine: Received Pneumococcal Vaccination: No Implantable Devices Family and Social History Former smoker - 10 years ago; Marital Status - Married; Alcohol  Use: Moderate; Drug Use: No History; Caffeine Use: Never; Advanced Directives: No; Patient does not want information on Advanced Directives; Living Will: No; Medical Power of Attorney: No Physician Affirmation I have reviewed and agree with the above information. Electronic Signature(s) Signed: 02/04/2018  4:32:24 PM By: Alejandro Mulling Signed: 02/04/2018 5:32:46 PM By: Lenda Kelp PA-C Entered By: Lenda Kelp on 02/04/2018 10:09:20 [name] ([id number]) -------------------------------------------------------------------------------- SuperBill Details Patient Name: [name] Date of Service: 02/04/2018 Medical Record Number: [id number] Patient Account Number: [id number] Date of Birth/Sex: 1946-02-26 (72 y.o. 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, initial encounter T81.31XD Disruption of external operation (surgical) wound, not elsewhere classified, subsequent encounter Facility Procedures CPT4 Code: 78295621 Description: 11042 - DEB SUBQ TISSUE 20 SQ CM/< ICD-10 Diagnosis Description L97.312 Non-pressure chronic ulcer of right ankle with fat layer expo Modifier: sed Quantity: 1 CPT4 Code: 30865784 Description: 11045 - DEB SUBQ TISS EA ADDL 20CM ICD-10 Diagnosis Description L97.312 Non-pressure chronic ulcer of right ankle with fat layer expo Modifier: sed Quantity: 1 Physician Procedures CPT4 Code: 6962952 Description: 11042 - WC PHYS SUBQ TISS 20 SQ CM ICD-10 Diagnosis Description L97.312 Non-pressure chronic ulcer of right ankle with fat layer expos Modifier: ed Quantity: 1 CPT4 Code: 8413244 Description: 11045 - WC PHYS SUBQ TISS EA ADDL 20 CM ICD-10 Diagnosis Description L97.312 Non-pressure chronic ulcer of right ankle with fat layer expos Modifier: ed Quantity: 1 Electronic Signature(s) Signed:  02/04/2018 5:32:46 PM By: Lenda Kelp PA-C Entered By: Lenda Kelp on 02/04/2018 10:10:33

## 2018-02-07 NOTE — Progress Notes (Signed)
SARGON, SCOUTEN ([id number]) Visit Report for 02/04/2018 Arrival Information Details Patient Name: [name], [name]. Date of Service: 02/04/2018 9:30 AM Medical Record Number: [id number] Patient Account Number: [id number] Date of Birth/Sex: [birth year] (72 y.o. M) Treating RN: Curtis Sites Primary Care Alias Villagran: [name] Other Clinician: Referring Carley Glendenning: [name] Treating Santiaga Butzin/Extender: Linwood Dibbles, HOYT Weeks in Treatment: 107 Visit Information History Since Last Visit Added or deleted any medications: No Patient Arrived: Ambulatory Any new allergies or adverse reactions: No Arrival Time: 09:31 Had a fall or experienced change in No Accompanied By: self activities of daily living that may affect Transfer Assistance: None risk of falls: Patient Identification Verified: Yes Signs or symptoms of abuse/neglect since last visito No Secondary Verification Process Yes Hospitalized since last visit: No Completed: Implantable device outside of the clinic excluding No Patient Requires Transmission-Based No cellular tissue based products placed in the center Precautions: since last visit: Patient Has Alerts: Yes Has Dressing in Place as Prescribed: Yes Patient Alerts: Patient on Blood Has Compression in Place as Prescribed: Yes Thinner Pain Present Now: No Electronic Signature(s) Signed: 02/04/2018 3:48:56 PM By: Curtis Sites Entered By: Curtis Sites on 02/04/2018 09:32:02 [name] ([id number]) -------------------------------------------------------------------------------- Encounter Discharge Information Details Patient Name: [name] Date of Service: 02/04/2018 9:30 AM Medical Record Number: [id number] Patient Account Number: [id number] Date of Birth/Sex: 1946-07-26 (72 y.o. M) Treating RN: Renne Crigler Primary Care Akin Yi: [name] Other Clinician: Referring Patirica Longshore: [name] Treating Negar Sieler/Extender: Linwood Dibbles,  HOYT Weeks in Treatment: 107 Encounter Discharge Information Items Discharge Condition: Stable Ambulatory Status: Ambulatory Discharge Destination: Home Transportation: Private Auto Schedule Follow-up Appointment: Yes Clinical Summary of Care: Electronic Signature(s) Signed: 02/04/2018 11:44:07 AM By: Renne Crigler Entered By: Renne Crigler on 02/04/2018 10:13:21 [name] ([id number]) -------------------------------------------------------------------------------- Lower Extremity Assessment Details Patient Name: [name] Date of Service: 02/04/2018 9:30 AM Medical Record Number: [id number] Patient Account Number: [id number] Date of Birth/Sex: 06/05/1946 (72 y.o. M) Treating RN: Curtis Sites Primary Care Sedona Wenk: [name] Other Clinician: Referring Laken Lobato: [name] Treating Valinda Fedie/Extender: STONE III, HOYT Weeks in Treatment: 107 Edema Assessment Assessed: [Left: No] [Right: No] [Left: Edema] [Right: :] Calf Left: Right: Point of Measurement: 40 cm From Medial Instep cm 36.6 cm Ankle Left: Right: Point of Measurement: 16 cm From Medial Instep cm 22.2 cm Vascular Assessment Pulses: Dorsalis Pedis Palpable: [Right:Yes] Posterior Tibial Extremity colors, hair growth, and conditions: Extremity Color: [Right:Hyperpigmented] Hair Growth on Extremity: [Right:No] Temperature of Extremity: [Right:Warm] Capillary Refill: [Right:< 3 seconds] Toe Nail Assessment Left: Right: Thick: Yes Discolored: Yes Deformed: Yes Improper Length and Hygiene: No Electronic Signature(s) Signed: 02/04/2018 3:48:56 PM By: Curtis Sites Entered By: Curtis Sites on 02/04/2018 09:43:08 [name] ([id number]) -------------------------------------------------------------------------------- Multi Wound Chart Details Patient Name: [name] Date of Service: 02/04/2018 9:30 AM Medical Record Number: [id number] Patient Account Number:  [id number] Date of Birth/Sex: 1946-04-16 (72 y.o. M) Treating RN: [name] Primary Care Marlos Carmen: [name] Other Clinician: Referring Pyper Olexa: [name] Treating Travonte Byard/Extender: STONE III, HOYT Weeks in Treatment: 107 Vital Signs Height(in): 76 Pulse(bpm): 64 Weight(lbs): 238 Blood Pressure(mmHg): 133/54 Body Mass Index(BMI): 29 Temperature(F): 97.9 Respiratory Rate 18 (breaths/min): Photos: [N/A:N/A] Wound Location: Right Malleolus - Lateral Right Malleolus - Medial N/A Wounding Event: Gradually Appeared Gradually Appeared N/A Primary Etiology: Venous Leg Ulcer Venous Leg Ulcer N/A Comorbid History: Cataracts, Chronic Obstructive Cataracts, Chronic Obstructive N/A Pulmonary Disease (COPD), Pulmonary Disease (COPD), Osteoarthritis Osteoarthritis Date Acquired: 08/11/2015 01/30/2016 N/A Weeks of Treatment: 107 105  N/A Wound Status: Open Open N/A Clustered Wound: No Yes N/A Measurements L x W x D 5.5x3x0.2 3.9x3x0.1 N/A (cm) Area (cm) : 12.959 9.189 N/A Volume (cm) : 2.592 0.919 N/A % Reduction in Area: 25.00% 20.40% N/A % Reduction in Volume: 50.00% 20.40% N/A Classification: Full Thickness Without Full Thickness Without N/A Exposed Support Structures Exposed Support Structures Exudate Amount: Medium Small N/A Exudate Type: Purulent Purulent N/A Exudate Color: yellow, brown, green yellow, brown, green N/A Wound Margin: Distinct, outline attached Distinct, outline attached N/A Granulation Amount: Small (1-33%) Small (1-33%) N/A Granulation Quality: Pink Red N/A Necrotic Amount: Large (67-100%) Large (67-100%) N/A Exposed Structures: Fat Layer (Subcutaneous Fascia: No N/A Tissue) Exposed: Yes Fat Layer (Subcutaneous Fascia: No Tissue) Exposed: No Tendon: No Tendon: No Bresee, Tyion C. ([id number]) Muscle: No Muscle: No Joint: No Joint: No Bone: No Bone: No Epithelialization: None None N/A Periwound Skin Texture: Scarring:  Yes Excoriation: No N/A Excoriation: No Induration: No Induration: No Callus: No Callus: No Crepitus: No Crepitus: No Rash: No Rash: No Scarring: No Periwound Skin Moisture: Maceration: No Maceration: No N/A Dry/Scaly: No Dry/Scaly: No Periwound Skin Color: Erythema: Yes Atrophie Blanche: No N/A Atrophie Blanche: No Cyanosis: No Cyanosis: No Ecchymosis: No Ecchymosis: No Erythema: No Hemosiderin Staining: No Hemosiderin Staining: No Mottled: No Mottled: No Pallor: No Pallor: No Rubor: No Rubor: No Erythema Location: Circumferential N/A N/A Temperature: No Abnormality No Abnormality N/A Tenderness on Palpation: Yes Yes N/A Wound Preparation: Ulcer Cleansing: Ulcer Cleansing: N/A Rinsed/Irrigated with Saline Rinsed/Irrigated with Saline Topical Anesthetic Applied: Topical Anesthetic Applied: Other: lidocaine 4% Other: LIDOCAINE 4% Treatment Notes Electronic Signature(s) Signed: 02/04/2018 4:32:24 PM By: Alejandro MullingPinkerton, Debra Entered By: Alejandro MullingPinkerton, Debra on 02/04/2018 09:50:15 [name] StackBLACKWELL, [name] C. ([id number]) -------------------------------------------------------------------------------- Multi-Disciplinary Care Plan Details Patient Name: [name] StackBLACKWELL, Dozier C. Date of Service: 02/04/2018 9:30 AM Medical Record Number: [id number] Patient Account Number: [id number] Date of Birth/Sex: 29-Oct-1945 78(71 y.o. M) Treating RN: Phillis HaggisPinkerton, [name] Primary Care Shaunika Italiano: Darreld McleanMILES, LINDA Other Clinician: Referring Brisa Auth: Darreld McleanMILES, LINDA Treating Naara Kelty/Extender: STONE III, HOYT Weeks in Treatment: 107 Active Inactive ` Venous Leg Ulcer Nursing Diagnoses: Knowledge deficit related to disease process and management Goals: Patient will maintain optimal edema control Date Initiated: 04/03/2016 Target Resolution Date: 11/24/2016 Goal Status: Active Interventions: Compression as ordered Treatment Activities: Therapeutic compression applied : 04/03/2016 Notes: ` Wound/Skin  Impairment Nursing Diagnoses: Impaired tissue integrity Goals: Ulcer/skin breakdown will heal within 14 weeks Date Initiated: 01/17/2016 Target Resolution Date: 11/24/2016 Goal Status: Active Interventions: Assess ulceration(s) every visit Notes: Electronic Signature(s) Signed: 02/04/2018 4:32:24 PM By: Alejandro MullingPinkerton, Debra Entered By: Alejandro MullingPinkerton, Debra on 02/04/2018 09:50:03 [name] StackBLACKWELL, Martyn C. ([id number]) -------------------------------------------------------------------------------- Pain Assessment Details Patient Name: [name] StackBLACKWELL, Marvon C. Date of Service: 02/04/2018 9:30 AM Medical Record Number: [id number] Patient Account Number: [id number] Date of Birth/Sex: 29-Oct-1945 [age] y.o. M) Treating RN: Curtis Sitesorthy, Joanna Primary Care Efosa Treichler: Darreld McleanMILES, LINDA Other Clinician: Referring Kendrik Mcshan: Darreld McleanMILES, LINDA Treating Raeleigh Guinn/Extender: STONE III, HOYT Weeks in Treatment: 107 Active Problems Location of Pain Severity and Description of Pain Patient Has Paino No Site Locations Pain Management and Medication Current Pain Management: Electronic Signature(s) Signed: 02/04/2018 3:48:56 PM By: Curtis Sitesorthy, Joanna Entered By: Curtis Sitesorthy, Joanna on 02/04/2018 09:32:36 [name] StackBLACKWELL, Taitum C. ([id number]) -------------------------------------------------------------------------------- Patient/Caregiver Education Details Patient Name: [name] StackBLACKWELL, Zeph C. Date of Service: 02/04/2018 9:30 AM Medical Record Number: [id number] Patient Account Number: [id number] Date of Birth/Gender: 29-Oct-1945 [age] y.o. M) Treating RN: Renne CriglerFlinchum, Cheryl Primary Care Physician: Darreld McleanMILES, LINDA Other Clinician: Referring Physician: Darreld McleanMILES, LINDA Treating Physician/Extender: STONE III, HOYT Weeks in Treatment: 107  Education Assessment Education Provided To: Patient Education Topics Provided Wound Debridement: Handouts: Wound Debridement Methods: Explain/Verbal Responses: State content correctly Wound/Skin Impairment: Handouts: Caring  for Your Ulcer Methods: Explain/Verbal Responses: State content correctly Electronic Signature(s) Signed: 02/04/2018 11:44:07 AM By: Renne Crigler Entered By: Renne Crigler on 02/04/2018 10:13:42 [name] ([id number]) -------------------------------------------------------------------------------- Wound Assessment Details Patient Name: [name] Date of Service: 02/04/2018 9:30 AM Medical Record Number: [id number] Patient Account Number: [id number] Date of Birth/Sex: 1946-07-25 (72 y.o. M) Treating RN: Curtis Sites Primary Care Deshara Rossi: [name] Other Clinician: Referring Arden Axon: [name] Treating Denaisha Swango/Extender: STONE III, HOYT Weeks in Treatment: 107 Wound Status Wound Number: 1 Primary Venous Leg Ulcer Etiology: Wound Location: Right Malleolus - Lateral Wound Open Wounding Event: Gradually Appeared Status: Date Acquired: 08/11/2015 Comorbid Cataracts, Chronic Obstructive Pulmonary Weeks Of Treatment: 107 History: Disease (COPD), Osteoarthritis Clustered Wound: No Photos Photo Uploaded By: Curtis Sites on 02/04/2018 09:45:59 Wound Measurements Length: (cm) 5.5 Width: (cm) 3 Depth: (cm) 0.2 Area: (cm) 12.959 Volume: (cm) 2.592 % Reduction in Area: 25% % Reduction in Volume: 50% Epithelialization: None Tunneling: No Undermining: No Wound Description Full Thickness Without Exposed Support Classification: Structures Wound Margin: Distinct, outline attached Exudate Medium Amount: Exudate Type: Purulent Exudate Color: yellow, brown, green Foul Odor After Cleansing: No Slough/Fibrino Yes Wound Bed Granulation Amount: Small (1-33%) Exposed Structure Granulation Quality: Pink Fascia Exposed: No Necrotic Amount: Large (67-100%) Fat Layer (Subcutaneous Tissue) Exposed: Yes Necrotic Quality: Adherent Slough Tendon Exposed: No Muscle Exposed: No Joint Exposed: No Bone Exposed: No Milks, Adrin C.  ([id number]) Periwound Skin Texture Texture Color No Abnormalities Noted: No No Abnormalities Noted: No Callus: No Atrophie Blanche: No Crepitus: No Cyanosis: No Excoriation: No Ecchymosis: No Induration: No Erythema: Yes Rash: No Erythema Location: Circumferential Scarring: Yes Hemosiderin Staining: No Mottled: No Moisture Pallor: No No Abnormalities Noted: No Rubor: No Dry / Scaly: No Maceration: No Temperature / Pain Temperature: No Abnormality Tenderness on Palpation: Yes Wound Preparation Ulcer Cleansing: Rinsed/Irrigated with Saline Topical Anesthetic Applied: Other: lidocaine 4%, Treatment Notes Wound #1 (Right, Lateral Malleolus) 1. Cleansed with: Clean wound with Normal Saline 2. Anesthetic Topical Lidocaine 4% cream to wound bed prior to debridement 3. Peri-wound Care: Ointment 4. Dressing Applied: Prisma Ag Notes gentamycin in wound bed, kerlix wrap with darko shoe Electronic Signature(s) Signed: 02/04/2018 3:48:56 PM By: Curtis Sites Entered By: Curtis Sites on 02/04/2018 09:42:02 [name] ([id number]) -------------------------------------------------------------------------------- Wound Assessment Details Patient Name: [name] Date of Service: 02/04/2018 9:30 AM Medical Record Number: [id number] Patient Account Number: [id number] Date of Birth/Sex: [birth year] (72 y.o. M) Treating RN: Curtis Sites Primary Care Jeryn Cerney: [name] Other Clinician: Referring Emillio Ngo: [name] Treating Brayon Bielefeld/Extender: STONE III, HOYT Weeks in Treatment: 107 Wound Status Wound Number: 2 Primary Venous Leg Ulcer Etiology: Wound Location: Right Malleolus - Medial Wound Open Wounding Event: Gradually Appeared Status: Date Acquired: 01/30/2016 Comorbid Cataracts, Chronic Obstructive Pulmonary Weeks Of Treatment: 105 History: Disease (COPD), Osteoarthritis Clustered Wound: Yes Photos Photo Uploaded By: Curtis Sites on  02/04/2018 09:46:00 Wound Measurements Length: (cm) 3.9 Width: (cm) 3 Depth: (cm) 0.1 Area: (cm) 9.189 Volume: (cm) 0.919 % Reduction in Area: 20.4% % Reduction in Volume: 20.4% Epithelialization: None Tunneling: No Undermining: No Wound Description Full Thickness Without Exposed Support Classification: Structures Wound Margin: Distinct, outline attached Exudate Small Amount: Exudate Type: Purulent Exudate Color: yellow, brown, green Foul Odor After Cleansing: No Slough/Fibrino Yes Wound Bed Granulation Amount: Small (1-33%) Exposed Structure Granulation Quality: Red Fascia Exposed: No  Necrotic Amount: Large (67-100%) Fat Layer (Subcutaneous Tissue) Exposed: No Necrotic Quality: Adherent Slough Tendon Exposed: No Muscle Exposed: No Joint Exposed: No Bone Exposed: No Rafanan, Calin C. ([id number]) Periwound Skin Texture Texture Color No Abnormalities Noted: No No Abnormalities Noted: No Callus: No Atrophie Blanche: No Crepitus: No Cyanosis: No Excoriation: No Ecchymosis: No Induration: No Erythema: No Rash: No Hemosiderin Staining: No Scarring: No Mottled: No Pallor: No Moisture Rubor: No No Abnormalities Noted: No Dry / Scaly: No Temperature / Pain Maceration: No Temperature: No Abnormality Tenderness on Palpation: Yes Wound Preparation Ulcer Cleansing: Rinsed/Irrigated with Saline Topical Anesthetic Applied: Other: LIDOCAINE 4%, Treatment Notes Wound #2 (Right, Medial Malleolus) 1. Cleansed with: Clean wound with Normal Saline 2. Anesthetic Topical Lidocaine 4% cream to wound bed prior to debridement 3. Peri-wound Care: Ointment 4. Dressing Applied: Prisma Ag Notes gentamycin in wound bed, kerlix wrap with darko shoe Electronic Signature(s) Signed: 02/04/2018 3:48:56 PM By: Curtis Sites Entered By: Curtis Sites on 02/04/2018 09:42:30 [name]  ([id number]) -------------------------------------------------------------------------------- Vitals Details Patient Name: [name] Date of Service: 02/04/2018 9:30 AM Medical Record Number: [id number] Patient Account Number: [id number] Date of Birth/Sex: 1946/01/04 (72 y.o. M) Treating RN: Curtis Sites Primary Care Patrycja Mumpower: [name] Other Clinician: Referring Theseus Birnie: [name] Treating Fujiko Picazo/Extender: STONE III, HOYT Weeks in Treatment: 107 Vital Signs Time Taken: 09:34 Temperature (F): 97.9 Height (in): 76 Pulse (bpm): 64 Weight (lbs): 238 Respiratory Rate (breaths/min): 18 Body Mass Index (BMI): 29 Blood Pressure (mmHg): 133/54 Reference Range: 80 - 120 mg / dl Electronic Signature(s) Signed: 02/04/2018 3:48:56 PM By: Curtis Sites Entered By: Curtis Sites on 02/04/2018 09:35:09

## 2018-02-11 ENCOUNTER — Encounter: Payer: Medicare HMO | Admitting: Physician Assistant

## 2018-02-11 DIAGNOSIS — L97312 Non-pressure chronic ulcer of right ankle with fat layer exposed: Secondary | ICD-10-CM | POA: Diagnosis not present

## 2018-02-13 NOTE — Progress Notes (Signed)
[name], Kerri C. ([id number]) Visit Report for 02/11/2018 Arrival Information Details Patient Name: [name], [name] Date of Service: 02/11/2018 8:00 AM Medical Record Number: [id number] Patient Account Number: [id number] Date of Birth/Sex: August 22, 1946 [age] y.o. M) Treating RN: Renne CriglerFlinchum, Cheryl Primary Care Kammie Scioli: Darreld McleanMILES, LINDA Other Clinician: Referring Demetry Bendickson: Darreld McleanMILES, LINDA Treating Sonu Kruckenberg/Extender: Linwood DibblesSTONE III, HOYT Weeks in Treatment: 108 Visit Information History Since Last Visit All ordered tests and consults were completed: No Patient Arrived: Ambulatory Added or deleted any medications: No Arrival Time: 08:04 Any new allergies or adverse reactions: No Accompanied By: self Had a fall or experienced change in No Transfer Assistance: None activities of daily living that may affect Patient Identification Verified: Yes risk of falls: Secondary Verification Process Yes Pain Present Now: No Completed: Patient Requires Transmission-Based No Precautions: Patient Has Alerts: Yes Patient Alerts: Patient on Blood Thinner Electronic Signature(s) Signed: 02/11/2018 12:50:22 PM By: Renne CriglerFlinchum, Cheryl Entered By: Renne CriglerFlinchum, Cheryl on 02/11/2018 08:08:40 [name], [name] ([id number]) -------------------------------------------------------------------------------- Encounter Discharge Information Details Patient Name: [name], [name] Date of Service: 02/11/2018 8:00 AM Medical Record Number: [id number] Patient Account Number: [id number] Date of Birth/Sex: August 22, 1946 [age] y.o. M) Treating RN: Renne CriglerFlinchum, Cheryl Primary Care Keyanna Sandefer: Darreld McleanMILES, LINDA Other Clinician: Referring Keiasia Christianson: Darreld McleanMILES, LINDA Treating Leemon Ayala/Extender: Linwood DibblesSTONE III, HOYT Weeks in Treatment: 108 Encounter Discharge Information Items Discharge Condition: Stable Ambulatory Status: Ambulatory Discharge Destination: Home Transportation: Private Auto Schedule Follow-up Appointment: Yes Clinical Summary of  Care: Electronic Signature(s) Signed: 02/11/2018 12:50:22 PM By: Renne CriglerFlinchum, Cheryl Entered By: Renne CriglerFlinchum, Cheryl on 02/11/2018 08:45:43 [name], [name] ([id number]) -------------------------------------------------------------------------------- Lower Extremity Assessment Details Patient Name: [name], [name] Date of Service: 02/11/2018 8:00 AM Medical Record Number: [id number] Patient Account Number: [id number] Date of Birth/Sex: August 22, 1946 [age] y.o. M) Treating RN: Renne CriglerFlinchum, Cheryl Primary Care Baruc Tugwell: Darreld McleanMILES, LINDA Other Clinician: Referring Syriana Croslin: Darreld McleanMILES, LINDA Treating Corinthia Helmers/Extender: STONE III, HOYT Weeks in Treatment: 108 Edema Assessment Assessed: [Left: No] [Right: No] Edema: [Left: N] [Right: o] Calf Left: Right: Point of Measurement: 40 cm From Medial Instep cm 35 cm Ankle Left: Right: Point of Measurement: 16 cm From Medial Instep cm 22 cm Vascular Assessment Claudication: Claudication Assessment [Right:None] Pulses: Dorsalis Pedis Palpable: [Right:Yes] Posterior Tibial Extremity colors, hair growth, and conditions: Extremity Color: [Right:Normal] Hair Growth on Extremity: [Right:No] Capillary Refill: [Right:< 3 seconds] Toe Nail Assessment Left: Right: Thick: Yes Discolored: Yes Deformed: Yes Improper Length and Hygiene: Yes Electronic Signature(s) Signed: 02/11/2018 12:50:22 PM By: Renne CriglerFlinchum, Cheryl Entered By: Renne CriglerFlinchum, Cheryl on 02/11/2018 08:13:01 [name], Ariv C. ([id number]) -------------------------------------------------------------------------------- Multi Wound Chart Details Patient Name: [name], [name] Date of Service: 02/11/2018 8:00 AM Medical Record Number: [id number] Patient Account Number: [id number] Date of Birth/Sex: August 22, 1946 [age] y.o. M) Treating RN: Phillis HaggisPinkerton, [name] Primary Care Diem Pagnotta: Darreld McleanMILES, LINDA Other Clinician: Referring Anyely Cunning: Darreld McleanMILES, LINDA Treating Levar Fayson/Extender: STONE III, HOYT Weeks in Treatment:  108 Vital Signs Height(in): 76 Pulse(bpm): 71 Weight(lbs): 238 Blood Pressure(mmHg): 131/64 Body Mass Index(BMI): 29 Temperature(F): 98.3 Respiratory Rate 18 (breaths/min): Photos: [1:No Photos] [2:No Photos] [N/A:N/A] Wound Location: [1:Right Malleolus - Lateral] [2:Right Malleolus - Medial] [N/A:N/A] Wounding Event: [1:Gradually Appeared] [2:Gradually Appeared] [N/A:N/A] Primary Etiology: [1:Venous Leg Ulcer] [2:Venous Leg Ulcer] [N/A:N/A] Comorbid History: [1:Cataracts, Chronic Obstructive Cataracts, Chronic Obstructive N/A Pulmonary Disease (COPD), Pulmonary Disease (COPD), Osteoarthritis] [2:Osteoarthritis] Date Acquired: [1:08/11/2015] [2:01/30/2016] [N/A:N/A] Weeks of Treatment: [1:108] [2:106] [N/A:N/A] Wound Status: [1:Open] [2:Open] [N/A:N/A] Clustered Wound: [1:No] [2:Yes] [N/A:N/A] Measurements L x W x D [1:6.2x3x0.2] [2:3.8x3x0.1] [N/A:N/A] (cm) Area (cm) : [1:14.608] [2:8.954] [N/A:N/A] Volume (cm) : [1:2.922] [2:0.895] [N/A:N/A] %  Reduction in Area: [1:15.50%] [2:22.40%] [N/A:N/A] % Reduction in Volume: [1:43.60%] [2:22.50%] [N/A:N/A] Classification: [1:Full Thickness Without Exposed Support Structures] [2:Full Thickness Without Exposed Support Structures] [N/A:N/A] Exudate Amount: [1:Medium] [2:Small] [N/A:N/A] Exudate Type: [1:Purulent] [2:Purulent] [N/A:N/A] Exudate Color: [1:yellow, brown, green] [2:yellow, brown, green] [N/A:N/A] Wound Margin: [1:Distinct, outline attached] [2:Distinct, outline attached] [N/A:N/A] Granulation Amount: [1:Small (1-33%)] [2:Small (1-33%)] [N/A:N/A] Granulation Quality: [1:Pink] [2:Red] [N/A:N/A] Necrotic Amount: [1:Large (67-100%)] [2:Large (67-100%)] [N/A:N/A] Exposed Structures: [1:Fat Layer (Subcutaneous Tissue) Exposed: Yes Fascia: No Tendon: No Muscle: No Joint: No Bone: No] [2:Fascia: No Fat Layer (Subcutaneous Tissue) Exposed: No Tendon: No Muscle: No Joint: No Bone: No] [N/A:N/A] Epithelialization: [1:None] [2:None]  [N/A:N/A] Periwound Skin Texture: [1:Scarring: Yes Excoriation: No Induration: No] [2:Excoriation: No Induration: No Callus: No] [N/A:N/A] Callus: No Crepitus: No Crepitus: No Rash: No Rash: No Scarring: No Periwound Skin Moisture: Maceration: No Maceration: No N/A Dry/Scaly: No Dry/Scaly: No Periwound Skin Color: Erythema: Yes Atrophie Blanche: No N/A Atrophie Blanche: No Cyanosis: No Cyanosis: No Ecchymosis: No Ecchymosis: No Erythema: No Hemosiderin Staining: No Hemosiderin Staining: No Mottled: No Mottled: No Pallor: No Pallor: No Rubor: No Rubor: No Erythema Location: Circumferential N/A N/A Temperature: No Abnormality No Abnormality N/A Tenderness on Palpation: Yes Yes N/A Wound Preparation: Ulcer Cleansing: Ulcer Cleansing: N/A Rinsed/Irrigated with Saline Rinsed/Irrigated with Saline Topical Anesthetic Applied: Topical Anesthetic Applied: Other: lidocaine 4% Other: LIDOCAINE 4% Treatment Notes Electronic Signature(s) Signed: 02/11/2018 4:21:54 PM By: Alejandro Mulling Entered By: Alejandro Mulling on 02/11/2018 08:21:00 [name] Stack ([id number]) -------------------------------------------------------------------------------- Multi-Disciplinary Care Plan Details Patient Name: [name] Stack Date of Service: 02/11/2018 8:00 AM Medical Record Number: [id number] Patient Account Number: [id number] Date of Birth/Sex: 12-10-1945 (72 y.o. M) Treating RN: [name] Primary Care Kaisei Gilbo: [name] Other Clinician: Referring Naomee Nowland: [name] Treating Lorian Yaun/Extender: STONE III, HOYT Weeks in Treatment: 108 Active Inactive ` Venous Leg Ulcer Nursing Diagnoses: Knowledge deficit related to disease process and management Goals: Patient will maintain optimal edema control Date Initiated: 04/03/2016 Target Resolution Date: 11/24/2016 Goal Status: Active Interventions: Compression as ordered Treatment Activities: Therapeutic  compression applied : 04/03/2016 Notes: ` Wound/Skin Impairment Nursing Diagnoses: Impaired tissue integrity Goals: Ulcer/skin breakdown will heal within 14 weeks Date Initiated: 01/17/2016 Target Resolution Date: 11/24/2016 Goal Status: Active Interventions: Assess ulceration(s) every visit Notes: Electronic Signature(s) Signed: 02/11/2018 4:21:54 PM By: Alejandro Mulling Entered By: Alejandro Mulling on 02/11/2018 08:20:51 [name] Stack ([id number]) -------------------------------------------------------------------------------- Pain Assessment Details Patient Name: [name] Stack Date of Service: 02/11/2018 8:00 AM Medical Record Number: [id number] Patient Account Number: [id number] Date of Birth/Sex: 1945-12-03 (72 y.o. M) Treating RN: Renne Crigler Primary Care Kawon Willcutt: [name] Other Clinician: Referring Thalya Fouche: [name] Treating Malania Gawthrop/Extender: STONE III, HOYT Weeks in Treatment: 108 Active Problems Location of Pain Severity and Description of Pain Patient Has Paino No Site Locations Pain Management and Medication Current Pain Management: Electronic Signature(s) Signed: 02/11/2018 12:50:22 PM By: Renne Crigler Entered By: Renne Crigler on 02/11/2018 08:08:49 [name] Stack ([id number]) -------------------------------------------------------------------------------- Patient/Caregiver Education Details Patient Name: [name] Stack Date of Service: 02/11/2018 8:00 AM Medical Record Number: [id number] Patient Account Number: [id number] Date of Birth/Gender: 03/24/1946 (72 y.o. M) Treating RN: Renne Crigler Primary Care Physician: [name] Other Clinician: Referring Physician: [name] Treating Physician/Extender: Skeet Simmer in Treatment: 108 Education Assessment Education Provided To: Patient Education Topics Provided Wound Debridement: Handouts: Wound Debridement Methods: Explain/Verbal Responses: State  content correctly Wound/Skin Impairment: Handouts: Caring for Your Ulcer Methods: Explain/Verbal Responses: State content correctly Electronic Signature(s) Signed: 02/11/2018  12:50:22 PM By: Renne Crigler Entered By: Renne Crigler on 02/11/2018 08:45:59 [name] Stack ([id number]) -------------------------------------------------------------------------------- Wound Assessment Details Patient Name: [name] Stack Date of Service: 02/11/2018 8:00 AM Medical Record Number: [id number] Patient Account Number: [id number] Date of Birth/Sex: Aug 25, 1946 (72 y.o. M) Treating RN: Renne Crigler Primary Care Rochelle Nephew: [name] Other Clinician: Referring Aireana Ryland: [name] Treating Jeronda Don/Extender: STONE III, HOYT Weeks in Treatment: 108 Wound Status Wound Number: 1 Primary Venous Leg Ulcer Etiology: Wound Location: Right Malleolus - Lateral Wound Open Wounding Event: Gradually Appeared Status: Date Acquired: 08/11/2015 Comorbid Cataracts, Chronic Obstructive Pulmonary Weeks Of Treatment: 108 History: Disease (COPD), Osteoarthritis Clustered Wound: No Photos Photo Uploaded By: Renne Crigler on 02/11/2018 16:09:31 Wound Measurements Length: (cm) 6.2 Width: (cm) 3 Depth: (cm) 0.2 Area: (cm) 14.608 Volume: (cm) 2.922 % Reduction in Area: 15.5% % Reduction in Volume: 43.6% Epithelialization: None Tunneling: No Undermining: No Wound Description Full Thickness Without Exposed Support Classification: Structures Wound Margin: Distinct, outline attached Exudate Medium Amount: Exudate Type: Purulent Exudate Color: yellow, brown, green Foul Odor After Cleansing: No Slough/Fibrino Yes Wound Bed Granulation Amount: Small (1-33%) Exposed Structure Granulation Quality: Pink Fascia Exposed: No Necrotic Amount: Large (67-100%) Fat Layer (Subcutaneous Tissue) Exposed: Yes Necrotic Quality: Adherent Slough Tendon Exposed: No Muscle Exposed: No Joint  Exposed: No Bone Exposed: No Vane, Alyxander C. ([id number]) Periwound Skin Texture Texture Color No Abnormalities Noted: No No Abnormalities Noted: No Callus: No Atrophie Blanche: No Crepitus: No Cyanosis: No Excoriation: No Ecchymosis: No Induration: No Erythema: Yes Rash: No Erythema Location: Circumferential Scarring: Yes Hemosiderin Staining: No Mottled: No Moisture Pallor: No No Abnormalities Noted: No Rubor: No Dry / Scaly: No Maceration: No Temperature / Pain Temperature: No Abnormality Tenderness on Palpation: Yes Wound Preparation Ulcer Cleansing: Rinsed/Irrigated with Saline Topical Anesthetic Applied: Other: lidocaine 4%, Treatment Notes Wound #1 (Right, Lateral Malleolus) 1. Cleansed with: Clean wound with Normal Saline 2. Anesthetic Topical Lidocaine 4% cream to wound bed prior to debridement 3. Peri-wound Care: Other peri-wound care (specify in notes) 4. Dressing Applied: Prisma Ag 5. Secondary Dressing Applied Kerlix/Conform Notes gentamycin cream in wound bed Electronic Signature(s) Signed: 02/11/2018 12:50:22 PM By: Renne Crigler Entered By: Renne Crigler on 02/11/2018 08:10:43 [name] Stack ([id number]) -------------------------------------------------------------------------------- Wound Assessment Details Patient Name: [name] Stack Date of Service: 02/11/2018 8:00 AM Medical Record Number: [id number] Patient Account Number: [id number] Date of Birth/Sex: 11/02/1945 (72 y.o. M) Treating RN: Renne Crigler Primary Care Taia Bramlett: [name] Other Clinician: Referring Mayah Urquidi: [name] Treating Mashayla Lavin/Extender: STONE III, HOYT Weeks in Treatment: 108 Wound Status Wound Number: 2 Primary Venous Leg Ulcer Etiology: Wound Location: Right Malleolus - Medial Wound Open Wounding Event: Gradually Appeared Status: Date Acquired: 01/30/2016 Comorbid Cataracts, Chronic Obstructive Pulmonary Weeks Of Treatment:  106 History: Disease (COPD), Osteoarthritis Clustered Wound: Yes Photos Photo Uploaded By: Renne Crigler on 02/11/2018 16:09:32 Wound Measurements Length: (cm) 3.8 Width: (cm) 3 Depth: (cm) 0.1 Area: (cm) 8.954 Volume: (cm) 0.895 % Reduction in Area: 22.4% % Reduction in Volume: 22.5% Epithelialization: None Tunneling: No Undermining: No Wound Description Full Thickness Without Exposed Support Classification: Structures Wound Margin: Distinct, outline attached Exudate Small Amount: Exudate Type: Purulent Exudate Color: yellow, brown, green Foul Odor After Cleansing: No Slough/Fibrino Yes Wound Bed Granulation Amount: Small (1-33%) Exposed Structure Granulation Quality: Red Fascia Exposed: No Necrotic Amount: Large (67-100%) Fat Layer (Subcutaneous Tissue) Exposed: No Necrotic Quality: Adherent Slough Tendon Exposed: No Muscle Exposed: No Joint Exposed: No Bone Exposed: No Almeda, Cornelis C. ([id number]) Periwound Skin  Texture Texture Color No Abnormalities Noted: No No Abnormalities Noted: No Callus: No Atrophie Blanche: No Crepitus: No Cyanosis: No Excoriation: No Ecchymosis: No Induration: No Erythema: No Rash: No Hemosiderin Staining: No Scarring: No Mottled: No Pallor: No Moisture Rubor: No No Abnormalities Noted: No Dry / Scaly: No Temperature / Pain Maceration: No Temperature: No Abnormality Tenderness on Palpation: Yes Wound Preparation Ulcer Cleansing: Rinsed/Irrigated with Saline Topical Anesthetic Applied: Other: LIDOCAINE 4%, Treatment Notes Wound #2 (Right, Medial Malleolus) 1. Cleansed with: Clean wound with Normal Saline 2. Anesthetic Topical Lidocaine 4% cream to wound bed prior to debridement 3. Peri-wound Care: Other peri-wound care (specify in notes) 4. Dressing Applied: Prisma Ag 5. Secondary Dressing Applied Kerlix/Conform Notes gentamycin cream in wound bed Electronic Signature(s) Signed: 02/11/2018  12:50:22 PM By: Renne Crigler Entered By: Renne Crigler on 02/11/2018 08:11:18 [name] Stack ([id number]) -------------------------------------------------------------------------------- Vitals Details Patient Name: [name] Stack Date of Service: 02/11/2018 8:00 AM Medical Record Number: [id number] Patient Account Number: [id number] Date of Birth/Sex: 08/04/1946 (72 y.o. M) Treating RN: Renne Crigler Primary Care Mikhaela Zaugg: [name] Other Clinician: Referring Tyniah Kastens: [name] Treating Davier Tramell/Extender: STONE III, HOYT Weeks in Treatment: 108 Vital Signs Time Taken: 08:08 Temperature (F): 98.3 Height (in): 76 Pulse (bpm): 71 Weight (lbs): 238 Respiratory Rate (breaths/min): 18 Body Mass Index (BMI): 29 Blood Pressure (mmHg): 131/64 Reference Range: 80 - 120 mg / dl Electronic Signature(s) Signed: 02/11/2018 12:50:22 PM By: Renne Crigler Entered By: Renne Crigler on 02/11/2018 08:09:16

## 2018-02-13 NOTE — Progress Notes (Signed)
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 any odor at this point in time. He is tolerating the dressing changes when they are performed well. 06/05/16 at this point  in time today patient currently shows no interval signs or symptoms of local or systemic infection. Again his pain level he rates to be a 1 out of 10 at most and overall he tells me that generally this is not giving him much trouble. In fact he even feels maybe a little bit better than last week. We have continue with the 4-layer compression wrap in which she tolerates very well at this point. He is continuing to utilize the [place]. 06/12/16 I think there has been some progression in the status of both of these wounds over today again  covered in a gelatinous surface. Has been using Hydrofera Blue. We had used Iodoflex in the past I'm not sure if there was an issue other than changing to something that might progress towards closure faster 06/19/16; he did not tolerate the Flexeril last week secondary to pain and this was changed on Friday back to [facility] area he continues to have copious amounts of gelatinous surface slough which is think inhibiting the speed of healing this area 06/26/16 patient over the last week has utilized the Santyl to try to loosen up some of the tightly adherent slough that was noted on evaluation last week. The good news is he tells me that the medial malleoli region really does not bother him the right llateral malleoli region is more tender to palpation at this point in time especially in the central/inferior location. However it does appear that the Santyl has done his job to loosen up the adherent slough at this point in time. Fortunately he has no interval signs or symptoms of infection locally or systemically no purulent discharge noted. 07/03/16 at this point in time today patient's wounds appear to be significantly improved over the right medial and lateral malleolus locations. He has much less tenderness at this point in time and the wounds appear clean her although there is still adherent slough this is sufficiently improved over what I saw  last week. I still see no evidence of local infection. 07/10/16; continued gradual improvement in the right medial and lateral malleolus locations. The lateral is more substantial wound now divided into 2 by a rim of normal epithelialization. 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 He denies any complaints of pain or intolerance to compression. Both ulcers are smaller in size oriented today's measurements, both are covered with a softly adherent slough. 07/24/16; medial wound is smaller, lateral about the same although surface looks better. Still using Hydrofera Blue 07/31/16; arrives today complaining of pain in the lateral part of his foot. Nurse reports a lot more drainage. He has been using Hydrofera Blue. Switch to silver alginate today 08/03/2016 -- I was asked to see the patient was here for a nurse visit today. I understand he had a lot of pain in his right lower extremity and was having blisters on his right foot which have not been there before. Though he started on doxycycline he does not have blisters elsewhere on his body. I do not believe this is a drug allergy. also mentioned that there was a copious purulent discharge from the wound and clinically there is no evidence of cellulitis. 08/07/16; I note that the patient came in for his nurse check on Friday apparently with blisters on his toes on the right than a lot of swelling in his forefoot. He continued on the doxycycline that I had prescribed on 12/8. A culture was done of the lateral wound that showed a combination of a few Proteus and Pseudomonas. Doxycycline might of covered the Proteus but would be unlikely to cover the Pseudomonas. He is on Coumadin. He arrives in the clinic today feeling a lot better states the pain is a lot better but nothing specific really was done other than to rewrap the foot also  noted that he had arterial studies ordered in August although these were never done. It is reasonable to go ahead and reorder these. 08/14/16; generally arrives in a better state today in terms  of the wounds he has taken cefdinir for one week. Our intake nurse reports copious amounts of drainage but the patient is complaining of much less pain. He is not had his PT and INR checked and I've asked him to do this today or tomorrow. 08/24/2016 -- patient arrives today after 10 days and said he had a stomach upset. His arterial study was done and I have reviewed this report and find it to be within normal limits. However I did not note any venous duplex studies for reflux, and Dr. Leanord Hawking may have ordered these in the past but I will leave it to him to decide if he needs these. The patient has finished his course of cefdinir. 08/28/16; patient arrives today again with copious amounts of thick really green drainage for our intake nurse. He states he has a very tender spot at the superior part of the lateral wound. Wounds are larger 09/04/16; no real change in the condition of this patient's wound still copious amounts of surface slough. Started him on Iodoflex last week he is completing another course of Cefdinir or which I think was done empirically. His arterial study showed ABIs were 1.1 on the right 1.5 on the left. He did have a slightly reduced ABI in the right the left one was not obtained. Had calcification of the right posterior tibial artery. The interpretation was no segmental stenosis. His waveforms were triphasic. [name], [name] ([id number]) His reflux studies are later this month. Depending on this I'll send him for a vascular consultation, he may need to see plastic surgery as I believe he is had plastic surgery on this foot in the past. He had an injury to the foot in the 1980s. 1/16 /18 right lateral greater than right medial ankle wounds on the right in the setting of previous skin  grafting. 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 He does not have arterial issues. Each week he comes in with the same adherent surface slough although there was less of this today 09/18/16; right lateral greater than right medial lower extremity wounds in the setting of previous skin grafting and trauma. He has least to vein laser ablation scheduled for February 2 for venous reflux. He does not have significant arterial disease. Problem has been very difficult to handle surface slough/necrotic tissue. Recently using Iodoflex for this with some, albeit slow improvement 09/25/16; right lateral greater than right medial lower extremity venous wounds in the setting of previous skin grafting. He is going for ablation surgery on February 2 after this he'll come back here for rewrap. He has been using Iodoflex as the primary dressing. 10/02/16; right lateral greater than right medial lower extremity wounds in the setting of previous skin grafting. He had his ablation surgery last week, I don't have a report. He tolerated this well. Came in with a thigh-high Unna boots on Friday. We have been using Iodoflex as the primary dressing. His measurements are improving 10/09/16; continues to make nice aggressive in terms of the wounds on his lateral and medial right ankle in the setting of previous skin grafting. Yesterday he noticed drainage at one of his surgical sites from his venous ablation on the right calf. He took off the bandage over this area felt a "popping" sensation and a reddish-brown drainage. He is not complaining of any pain 10/16/16; he continues to make nice progression in terms of  the wounds on the lateral and medial malleolus. Both smaller using Iodoflex. He had a surgical area in his posterior mid calf we have been using iodoform. All the wounds are down and dimensions 10/23/16; the patient arrives today with  no complaints. He states the Iodoflex is a bit uncomfortable. He is not systemically unwell. We have been using Iodoflex to the lateral right ankle and the medial and Aquacel Ag to the reflux surgical wound on the posterior right calf. All of these wounds are doing well 10/30/16; patient states he has no pain no systemic symptoms. I changed him to [facility] last week. Although the wounds are doing well 11/06/16; patient reports no pain or systemic symptoms. We continue with Hydrofera Blue. Both wound areas on the medial and lateral ankle appear to be doing well with improvement and dimensions and improvement in the wound bed. 11/13/16; patient's dimensions continued to improve. We continue with Hydrofera Blue on the medial and lateral side. Appear to be doing well with healthy granulation and advancing epithelialization 11/20/16; patient's dimensions improving laterally by about half a centimeter in length. Otherwise no change on the medial side. Using [facility] 12/04/16; no major change in patient's wound dimensions. Intake nurse reports more drainage. 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 He is voicing no complaints or concerns. He has been tolerating his twice weekly compression therapy changes 12/11/16 Patient complains of pain and increased drainage.. wants hydrofera blue 12/18/16 improvement. Sorbact 12/25/16; medial wound is smaller, lateral measures the same. Still on sorbact 01/01/17; medial wound continues to be smaller, lateral measures about the same however there is clearly advancing epithelialization here as well in fact I think the wound will ultimately divided into 2 open areas 01/08/17; unfortunately today fairly significant regression in several areas. Surface of the lateral wound covered again in adherent necrotic material which is difficult to debridement. He has  significant surrounding skin maceration. The expanding area of tissue epithelialization in the middle of the wound that was encouraging last week appears to be smaller. There is no surrounding tenderness. The area on the medial leg also did not seem to be as healthy as last week, the reason for this regression this week is not totally clear. We have been using Sorbact for the last 4 weeks. We'll switched of polymen AG which we will order via home medical supply. If there is a problem with this would switch back to Iodoflex 01/15/18; drainage,odor. No change. Switched to polymen last week 01/22/17; still continuous drainage. Culture I did last week showed a few Proteus pansensitive. I did this culture because of drainage. Put him on Augmentin which she has been taking since Saturday however he is developed 4-5 liquid bowel movements. He is also on Coumadin. Beyond this wound is not changed at all, still nonviable necrotic surface material which I debrided reveals healthy granulation line 01/29/17; still copious amounts of drainage reported by her intake nurse. Wound measuring slightly smaller. Currently fact on Iodoflex although I'm looking forward to changing back to perhaps [facility] or polymen AG 02/05/17; still large amounts of drainage and presenting with really large amounts of adherent slough and necrotic material over the remaining open area of the wound. We have been using Iodoflex but with little improvement in the surface. Change to [facility] 02/12/17; still large amount of drainage. Much less adherent slough however. Started Hydrofera Blue last week Kubicki,  KERMIT ARNETTE ([id number]) 02/19/17; drainage is better this week. Much less adherent slough. Perhaps some improvement in dimensions. Using [facility] 02/26/17; severe venous insufficiency wounds on the right lateral and right medial leg. Drainage is some better and slough is less adherent we've been using Hydrofera Blue 03/05/17  on evaluation today patient appears to be doing well. His wounds have been decreasing in size and overall he is pleased with how this is progressing. We are awaiting approval for the epigraph which has previously been recommended in the meantime the [facility] Dressing is to be doing very well for him. 03/12/17; wound dimensions are smaller still using Hydrofera Blue. Comes in on Fridays for a dressing change. 03/19/17; wound dimensions continued to contract. Healing of this wound is complicated by continuous significant drainage as well as recurrent buildup of necrotic surface material. We looked into Apligraf and he has a $290 co-pay per application but truthfully I think the drainage as well as the nonviable surface would preclude use of Apligraf there are any other skin substitute at this point therefore the continued plan will be debridement each clinic visit, 2 times a week dressing changes and continued use of Hydrofera Blue. improvement has been very slow but sustained 03/26/17 perhaps slight improvements in peripheral epithelialization is especially inferiorly. Still with large amount of drainage and tightly adherent necrotic surface on arrival. Along with the intake nurse I reviewed previous treatment. He worsened on Iodoflex and had a 4 week trial of sorbact, Polymen AG and a long courses of Aquacel Ag. He is not a candidate for advanced treatment options for many reasons 04/09/17 on evaluation today patient's right lower extremity wounds appear to be doing a little better. Fortunately he has no significant discomfort and has been tolerating the dressing changes including the wraps without complication. With that being said he really does not have swelling anymore compared to what he has had in the past following his vascular intervention. He wonders if potentially we could attempt avoiding the rats to see if he could cleanse the wound in between to try to prevent some of the fiber and  its buildup that occurs in the interim between when we see him week to week. No fevers, chills, nausea, or vomiting noted at this time. 04/16/17; noted that the staff made a choice last week not to put him in compression. The patient is changing his dressing at home using [facility] and changing every second day. His wife is washing the wound with saline. He is using kerlix. Surprisingly he has not developed a lot of edema. This choice was made because of the degree of fluid retention and maceration even with changing his dressing twice a week 04/23/17; absolutely no change. Using Hydrofera Blue. Recurrent tightly adherent nonviable surface material. This is been refractory to Iodoflex, sorbact and now Hydrofera Blue. More recently he has been changing his own dressings at home, cleansing the wound in the shower. He has not developed lower extremity edema 04/30/17; if anything the wound is larger still in adherent surface although it debrided easily today. I've been using Mehta honey for 1 week and I'd like to try and do it for a second week this see if we can get some form of viable surface here. 05/07/17; patient arrives with copious amounts of drainage and some pain in the superior part of the wound. He has not been systemically unwell. He's been changing this daily at home 05/14/17; patient arrives today complaining of less drainage and  less pain. Dimensions slightly better. Surface culture I did of this last week grew MSSA I put him on dicloxacillin for 10 days. Patient requesting a further prescription since he feels so much better. He did not obtain the [facility] AG for reasons that aren't clear, he has been using Hydrofera Blue 05/21/17; perhaps some less drainage and less pain. He is completing another week of doxycycline. Unfortunately there is no change in the wound measurements are appearance still tightly adherent necrotic surface material that is really defied treatment. We have been  using Hydrofera Blue most recently. He did not manage to get Anasept. He was told it was prescription at [facility] 05/29/17; absolutely no improvement in these wound areas. He had remote plastic surgery with who was done at [facility] in [place] surrounding this area. This was a traumatic wound with extensive plastic surgery/skin grafting at that time. I switched him to sorbact last week to see if he can do anything about the recurrent necrotic surface and drainage. This is largely been refractory to Iodoflex/Hydrofera Blue/alginates. I believe we tried Elby Showers and more recently Medi honey l however the drainage is really too excessive 06/04/17; patient went to see Dr. Ulice Bold. Per the patient she ordered him a compression stocking. Continued the Anasept gel and sorbact was already on. No major improvement in the condition of the wounds in fact the medial wound looks larger. Again per the patient she did not feel a skin graft or operative debridement was indicated The patient had previous venous ablation in February 2018. Last arterial studies were in December 2017 and were within normal limits 06/18/17; patient arrives back in clinic today using Anasept gel with sorbact. He changes this every day is wearing his own compression stocking. The patient actually saw Dr. Leta Baptist of plastic surgery. I've reviewed her note. The appointment was on 05/30/17. The basic issue with here was that she did not feel that skin grafts for patients with underlying stasis and edema have a good long-term success rate. She also discuss skin substitutes which has been done here as well however I have not been able to get the surface of these wounds to something that I think would support skin substitutes. This is why he felt he might need an [name], WOJNAROWSKI. ([id number]) operative debridement more aggressively than I can do in this clinic Review of systems; is otherwise negative he feels well 07/02/17; patient  has been using Anasept gel with Sorbact. He changes this every day scrubs out the wound beds. Arrives today with the wound bed looking some better. Easier debridement 07/16/17; patient has been using Anasept gel was sorbact for about a month now. The necrotic service on his wound bed is better and the drainage is now down to minimal but we've not made any improvements in the epithelialization. Change to Santyl today. Surface of the wound is still not good enough to support skin substitutes 07/30/17 on evaluation today patient appears to be doing very well in regard to his right bimalleolus wounds. He has been tolerating the treatment with the Anasept gel and sorbact. However we were going to try and switch to Santyl to be more aggressive with these wounds. This was however cost prohibitive. Therefore we will likely go back to the sorbact. Nonetheless he is not having any significant discomfort he still is forming slough over the wound location but nothing too significant. The wounds do appear to be filling in nicely. 08/13/17; I have not seen this wound in about a  month. There is not much change. The fact the area medially looks larger. He has been using sorbact and Anasept for over a month now without much effect. Arrives in clinic stating that he does not want the area debrided 08/28/17; patient arrives with the areas on his right medial and right lateral ankle worse. The wounds of expanded there is erythema and still drainage. There is no pain and no tenderness. We had been using Keracel AG clearly not doing well. 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 He states that even the evening that we began utilizing the Iodoflex he started  having burning. Subsequently this never really improved and he eventually discontinue the use of the Iodoflex altogether. Unfortunately he did not let us know about any of this until today. I'm unsure of any specific infection at this point although I am going to obtain a wound culture today in order to see if there's anything that could potentially be causing an infection as well. It does sound as if he's had the Iodoflex before and did not have this kind of reaction although this does sound very specific for a reaction to the Iodoflex. 09/17/17 on evaluation today patient appears to be doing significantly better compared to last week's evaluation. At that time it actually appears that he did have an infection the good news is that we have been able to get this under control with switching to the [facility] solution he's not having as much pain and discomfort he still does have some erythema surrounding the medial aspect wound. He has been tolerating the Dakin's soaked gauze dressing which is excellent and that his wounds do not seem to have as much adherent slough. Overall I'm pleased with the progress she's made in last week. 11/05/17 on evaluation today patient appears to be doing better in regard to his right medial and lateral malleolus ulcers. He has been tolerating the dressing changes without complication. I do flight the Freada Bergeron is helping with additional granulation at this point in the wound beds do appear to be a little bit better. With that being said patient does not appear to have any evidence of significant infection at this point there is no erythema as he has previously noted he does note that the 30 day course of antibiotics I placed him on will end tomorrow. 11/12/17 on evaluation today patient actually appears to be doing excellent in regard to his right medial and lateral malleolus ulcers. He has been tolerating the dressing changes without complication. Fortunately he has no evidence  of infection at this time and overall I believe he is progressing extremely nicely he has a lot of new epithelialization noted on evaluation today. Patient likewise is very pleased with the progress even just since last week. 11/19/17 on evaluation today patient appears to be doing about the same in regard to his ulcerations. He does not have any additional breakdown although he really has not noted any significant improvement either over the past week. With that being said the more concerning thing at this time is that he is beginning to show signs of erythema surrounding both wounds which is what typically happens and then he will develop into a more overt infection. This  has been the course for some time. I hope that doing the 30 day in a biotic cycle this past time would break that so that he would not have the same type of thing happen again. Nonetheless it appears that is digressing back into this infected state unfortunately. With that being said he is not having any pain currently which is good he typically does start to hurt more as this progresses. Fortunately there does not appear to be any evidence of infection systemically which is good news. 11/26/17 on evaluation today patient appears to actually be doing rather well in regard to his ulcer compared to last week. He still has your theme and noted around both the medial and lateral malleolus ulcers of the right lower extremity. He does not seem to be quite as overtly infected however. He has been tolerating the dressing changes without complication which is good news. The gentamicin cream does seem to have been of benefit for him. With that being said he does actually have an appointment with Dr. Sampson Goon this morning to see if there's anything that would be recommended in the way of IV antibiotic Millon, Adeel C. ([id number]) therapy. Otherwise he still has slough noted on the surface of the wound. 12/03/17 on evaluation today patient  presents for follow-up concerning his ongoing right lower extremity ulcers. He has been tolerating the dressing changes without complication he states he has not really been using gentamicin on the lateral ankle and this seems to be doing very well. For that reason I think that is probably fine to put a hold on the gentamicin he states his burns and stings when he applies it and we maybe be able to just use this intermittently when things become more irritated. Other than that the good news is I did review his MRI which was performed on 11/28/17 and revealed that he has a negative MRI for abscess, osteomyelitis, or septic joint. Obviously these were the issues that I was concerned with the possibility of and I'm pleased to find that there is no problems in that regard. I did also review Dr. Jarrett Ables note from infectious disease he discussed performed some lab work which apparently was done and then subsequently was going to consider the possibility of Keflex long-term for prevention of infection although this has not been prescribed as of yet. 12/09/17 on evaluation today patient appears to be doing a little bit worse in regard to his right medial and lateral malleolus ulcers. Fortunately the wound does not seem to be significantly worse although he does have a lot more drainage especially the lateral aspect he is having a lot more pain than what he has noted more recently. Fortunately there does not appear to be any evidence of systemic infection which is good news. Again he has seen Dr. Sampson Goon but he has not been placed on the potential Keflex which was recommended as a possibility at least yet. 12/17/17 unfortunately on evaluation today the patient's wound appears to be doing much worse. He has been performing the dressing changes as directed. Upon a review of notes in epic it does appear that Dr. Jarrett Ables office specifically sending Keflex for the patient on the 16th which was last  Tuesday. With that being said the patient states that though this was [facility] that [facility] stated they never received it and subsequently the patient never took anything. He tells me he has been in bed since Friday due to the increased pain and overall general malaise.  No fevers, chills, nausea, or vomiting noted at this time. 02/11/18 on evaluation today patient appears to be doing fairly well in regard to his right medial and lateral malleolus ulcers. He's been tolerating the dressing changes without complication. Fortunately there does not appear to be any evidence of infection. Overall very pleased with the progress that has been made. Patient History Information obtained from Patient. Social History Former smoker - 10 years ago, Marital Status - Married, Alcohol Use - Moderate, Drug Use - No History, Caffeine Use - Never. Medical And Surgical History Notes Constitutional Symptoms (General Health) Vein Filter (groin area); CODA; H/O Blood Clots; pulmonary hypertensive arterial disease Review of Systems (ROS) Constitutional Symptoms (General Health) Denies complaints or symptoms of Fever, Chills. Respiratory The patient has no complaints or symptoms. Cardiovascular The patient has no complaints or symptoms. Psychiatric The patient has no complaints or symptoms. Objective [name], [name] ([id number]) Constitutional Well-nourished and well-hydrated in no acute distress. Vitals Time Taken: 8:08 AM, Height: 76 in, Weight: 238 lbs, BMI: 29, Temperature: 98.3 F, Pulse: 71 bpm, Respiratory Rate: 18 breaths/min, Blood Pressure: 131/64 mmHg. Respiratory normal breathing without difficulty. Psychiatric this patient is able to make decisions and demonstrates good insight into disease process. Alert and Oriented x 3. pleasant and cooperative. General Notes: I'm gonna go ahead at this point and proceed with debridement in regard to both wounds. Patient tolerated this well  today without complication post debridement the wounds actually appear to be doing significantly better which is great news. Overall I'm very happy with how this is progressing especially in regard to the lateral wound although the medial also seems to be making slow but sure progress. Integumentary (Hair, Skin) Wound #1 status is Open. Original cause of wound was Gradually Appeared. The wound is located on the Right,Lateral Malleolus. The wound measures 6.2cm length x 3cm width x 0.2cm depth; 14.608cm^2 area and 2.922cm^3 volume. There is Fat Layer (Subcutaneous Tissue) Exposed exposed. There is no tunneling or undermining noted. There is a medium amount of purulent drainage noted. The wound margin is distinct with the outline attached to the wound base. There is small (1-33%) pink granulation within the wound bed. There is a large (67-100%) amount of necrotic tissue within the wound bed including Adherent Slough. The periwound skin appearance exhibited: Scarring, Erythema. The periwound skin appearance did not exhibit: Callus, Crepitus, Excoriation, Induration, Rash, Dry/Scaly, Maceration, Atrophie Blanche, Cyanosis, Ecchymosis, Hemosiderin Staining, Mottled, Pallor, Rubor. The surrounding wound skin color is noted with erythema which is circumferential. Periwound temperature was noted as No Abnormality. The periwound has tenderness on palpation. Wound #2 status is Open. Original cause of wound was Gradually Appeared. The wound is located on the Right,Medial Malleolus. The wound measures 3.8cm length x 3cm width x 0.1cm depth; 8.954cm^2 area and 0.895cm^3 volume. There is no tunneling or undermining noted. There is a small amount of purulent drainage noted. The wound margin is distinct with the outline attached to the wound base. There is small (1-33%) red granulation within the wound bed. There is a large (67-100%) amount of necrotic tissue within the wound bed including Adherent Slough. The  periwound skin appearance did not exhibit: Callus, Crepitus, Excoriation, Induration, Rash, Scarring, Dry/Scaly, Maceration, Atrophie Blanche, Cyanosis, Ecchymosis, Hemosiderin Staining, Mottled, Pallor, Rubor, Erythema. Periwound temperature was noted as No Abnormality. The periwound has tenderness on palpation. 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 ([id number]) Procedures Wound #1 Pre-procedure diagnosis of Wound #1 is a Venous Leg Ulcer located on the Right,Lateral Malleolus .Severity of Tissue Pre Debridement is: Fat layer exposed. There was a Excisional Skin/Subcutaneous Tissue Debridement with a total area of 18.6 sq cm performed by STONE III, Lindbergh Winkles E., PA-C. With the following instrument(s): Curette to remove Viable and Non-Viable tissue/material. Material removed includes Subcutaneous Tissue, Slough, and Fibrin/Exudate after achieving pain control using Lidocaine 4% Topical Solution. No specimens were taken. A time out was conducted at 08:22, prior to the start of the procedure. A Minimum amount of bleeding was controlled with Pressure. The procedure was tolerated well with a pain level of 0 throughout and a pain level of 0 following the procedure. Patient s Level of Consciousness post procedure was recorded as Awake and Alert. Post Debridement Measurements: 6.2cm length x 3cm width x 0.3cm depth; 4.383cm^3 volume. Character of Wound/Ulcer Post Debridement requires further debridement. Severity of Tissue Post Debridement is: Fat layer exposed. Post procedure Diagnosis Wound #1: Same as Pre-Procedure Wound  #2 Pre-procedure diagnosis of Wound #2 is a Venous Leg Ulcer located on the Right,Medial Malleolus .Severity of Tissue Pre Debridement is: Fat layer exposed. There was a Excisional Skin/Subcutaneous Tissue Debridement with a total area of 11.4 sq cm performed by STONE III, Venola Castello E., PA-C. With the following instrument(s): Curette to remove Viable and Non-Viable tissue/material. Material removed includes Subcutaneous Tissue, Slough, and Fibrin/Exudate after achieving pain control using Lidocaine 4% Topical Solution. No specimens were taken. A time out was conducted at 08:22, prior to the start of the procedure. A Minimum amount of bleeding was controlled with Pressure. The procedure was tolerated well with a pain level of 0 throughout and a pain level of 0 following the procedure. Patient s Level of Consciousness post procedure was recorded as Awake and Alert. Post Debridement Measurements: 3.8cm length x 3cm width x 0.2cm depth; 1.791cm^3 volume. Character of Wound/Ulcer Post Debridement requires further debridement. Severity of Tissue Post Debridement is: Fat layer exposed. Post procedure Diagnosis Wound #2: Same as Pre-Procedure Plan Wound Cleansing: Wound #1 Right,Lateral Malleolus: Clean wound with Normal Saline. Cleanse wound with mild soap and water May Shower, gently pat wound dry prior to applying new dressing. Wound #2 Right,Medial Malleolus: Clean wound with Normal Saline. Cleanse wound with mild soap and water May Shower, gently pat wound dry prior to applying new dressing. Anesthetic (add to Medication List): Wound #1 Right,Lateral Malleolus: Topical Lidocaine 4% cream applied to wound bed prior to debridement (In Clinic Only). Wound #2 Right,Medial Malleolus: Topical Lidocaine 4% cream applied to wound bed prior to debridement (In Clinic Only). Skin Barriers/Peri-Wound Care: Wound #1 Right,Lateral Malleolus: Barrier cream Wound #2 Right,Medial Malleolus: Barrier  cream Primary Wound Dressing: Wound #1 Right,Lateral Malleolus: Gentamicin Sulfate Cream - only on wound bed Cattell, Hiroshi C. ([id number]) Silver Collagen Wound #2 Right,Medial Malleolus: Gentamicin Sulfate Cream - only on wound bed Silver Collagen Secondary Dressing: Wound #1 Right,Lateral Malleolus: Dry Gauze Conform/Kerlix Wound #2 Right,Medial Malleolus: Dry Gauze Conform/Kerlix Dressing Change Frequency: Wound #1 Right,Lateral Malleolus: Change dressing every other day. Wound #2 Right,Medial Malleolus: Change dressing every other day. Follow-up Appointments: Wound #1 Right,Lateral Malleolus: Return Appointment in 2 weeks. Wound #2 Right,Medial Malleolus: Return Appointment in 2 weeks. Edema Control: Wound #1 Right,Lateral Malleolus: Elevate legs to the level of the heart and  pump ankles as often as possible Wound #2 Right,Medial Malleolus: Elevate legs to the level of the heart and pump ankles as often as possible Additional Orders / Instructions: Wound #1 Right,Lateral Malleolus: Increase protein intake. Wound #2 Right,Medial Malleolus: Increase protein intake. Medications-please add to medication list.: Wound #1 Right,Lateral Malleolus: Other: - Vitamin C, Zinc, MVI Wound #2 Right,Medial Malleolus: Other: - Vitamin C, Zinc, MVI The following medication(s) was prescribed: lidocaine topical 4 % cream 1 1 cream topical was prescribed at facility gentamicin topical 0.1 % cream cream topical applied in a thin film on the surface of the wounds with each dressing change starting 02/11/2018 At this time my suggestion for the patient is that we continue with the Current wound care measures. I am going to send in a prescription for the gentamicin cream that he has previously been using he just ran out and does need a refill. We will otherwise see were things stand in two weeks time we see him back for reevaluation. He tells me that he actually has an appointment with  [facility] next week on Tuesday and therefore won't be able to make his appointment here. I think that is fine we will see him and were things stand in two weeks time. Please see above for specific wound care orders. We will see patient for re-evaluation in 2 week(s) here in the clinic. If anything worsens or changes patient will contact our office for additional recommendations. Electronic Signature(s) Signed: 02/12/2018 7:52:50 AM By: Lenda Kelp PA-C Entered By: Lenda Kelp on 02/11/2018 08:38:40 [name] ([id number]) [name], [name] ([id number]) -------------------------------------------------------------------------------- ROS/PFSH Details Patient Name: [name] Date of Service: 02/11/2018 8:00 AM Medical Record Number: [id number] Patient Account Number: [id number] Date of Birth/Sex: 01-30-1946 (72 y.o. M) Treating RN: [name] Primary Care Provider: [name] Other Clinician: Referring Provider: [name] Treating Provider/Extender: STONE III, Elyssa Pendelton Weeks in Treatment: 108 Information Obtained From Patient Wound History Do you currently have one or more open woundso Yes How many open wounds do you currently haveo 1 Approximately how long have you had your woundso 5 months How have you been treating your wound(s) until nowo saline, dressing Has your wound(s) ever healed and then re-openedo Yes Have you had any lab work done in the past montho No Have you tested positive for an antibiotic resistant organism (MRSA, VRE)o No Have you tested positive for osteomyelitis (bone infection)o No Have you had any tests for circulation on your legso No Constitutional Symptoms (General Health) Complaints and Symptoms: Negative for: Fever; Chills Medical History: Past Medical History Notes: Vein Filter (groin area); CODA; H/O Blood Clots; pulmonary hypertensive arterial disease Eyes Medical History: Positive for: Cataracts - removed Negative for:  Glaucoma; Optic Neuritis Ear/Nose/Mouth/Throat Medical History: Negative for: Chronic sinus problems/congestion Hematologic/Lymphatic Medical History: Negative for: Anemia; Hemophilia; Human Immunodeficiency Virus; Lymphedema; Sickle Cell Disease Respiratory Complaints and Symptoms: No Complaints or Symptoms Medical History: Positive for: Chronic Obstructive Pulmonary Disease (COPD) Negative for: Aspiration; Asthma; Pneumothorax; Sleep Apnea; Tuberculosis Cardiovascular IZAIYAH, KLEINMAN. ([id number]) Complaints and Symptoms: No Complaints or Symptoms Medical History: Negative for: Angina; Arrhythmia; Congestive Heart Failure; Coronary Artery Disease; Deep Vein Thrombosis; Hypertension; Hypotension; Myocardial Infarction; Peripheral Arterial Disease; Peripheral Venous Disease; Phlebitis; Vasculitis Gastrointestinal Medical History: Negative for: Cirrhosis ; Colitis; Crohnos; Hepatitis A; Hepatitis B; Hepatitis C Endocrine Medical History: Negative for: Type I Diabetes; Type II Diabetes Genitourinary Medical History: Negative for: End Stage Renal Disease Immunological Medical History: Negative for: Lupus Erythematosus; Raynaudos  Integumentary (Skin) Medical History: Negative for: History of Burn; History of pressure wounds Musculoskeletal Medical History: Positive for: Osteoarthritis Negative for: Gout; Rheumatoid Arthritis; Osteomyelitis Neurologic Medical History: Negative for: Dementia; Neuropathy; Quadriplegia; Paraplegia; Seizure Disorder Oncologic Medical History: Negative for: Received Chemotherapy; Received Radiation Psychiatric Complaints and Symptoms: No Complaints or Symptoms Medical History: Negative for: Anorexia/bulimia; Confinement Anxiety HBO Extended History Items ELAZAR, ARGABRIGHT. ([id number]) Eyes: Cataracts Immunizations Pneumococcal Vaccine: Received Pneumococcal Vaccination: No Implantable Devices Family and Social History Former  smoker - 10 years ago; Marital Status - Married; Alcohol Use: Moderate; Drug Use: No History; Caffeine Use: Never; Advanced Directives: No; Patient does not want information on Advanced Directives; Living Will: No; Medical Power of Attorney: No Physician Affirmation I have reviewed and agree with the above information. Electronic Signature(s) Signed: 02/11/2018 4:21:54 PM By: Alejandro Mulling Signed: 02/12/2018 7:52:50 AM By: Lenda Kelp PA-C Entered By: Lenda Kelp on 02/11/2018 08:32:24 [name] ([id number]) -------------------------------------------------------------------------------- SuperBill Details Patient Name: [name] Date of Service: 02/11/2018 Medical Record Number: [id number] Patient Account Number: [id number] Date of Birth/Sex: 05-10-1946 (72 y.o. 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, initial encounter T81.31XD Disruption of external operation (surgical) wound, not elsewhere classified, subsequent encounter Facility Procedures CPT4 Code: 52841324 Description: 11042 - DEB SUBQ TISSUE 20 SQ CM/< ICD-10 Diagnosis Description L97.312 Non-pressure chronic ulcer of right ankle with fat layer expo Modifier: sed Quantity: 1 CPT4 Code: 40102725 Description: 11045 - DEB SUBQ TISS EA ADDL 20CM ICD-10 Diagnosis Description L97.312 Non-pressure chronic ulcer of right ankle with fat layer expo Modifier: sed Quantity: 1 Physician Procedures CPT4 Code: 3664403 Description: 11042 - WC PHYS SUBQ TISS 20  SQ CM ICD-10 Diagnosis Description L97.312 Non-pressure chronic ulcer of right ankle with fat layer expos Modifier: ed Quantity: 1 CPT4 Code: 4742595 Description: 11045 - WC PHYS SUBQ TISS EA ADDL 20 CM ICD-10 Diagnosis Description L97.312 Non-pressure chronic ulcer of right ankle with fat layer expos Modifier: ed Quantity: 1 Electronic Signature(s) Signed: 02/12/2018 7:52:50 AM By: Lenda Kelp PA-C Entered By: Lenda Kelp on 02/11/2018 08:35:59

## 2018-02-25 ENCOUNTER — Encounter: Payer: Medicare HMO | Attending: Physician Assistant | Admitting: Physician Assistant

## 2018-02-25 DIAGNOSIS — L97312 Non-pressure chronic ulcer of right ankle with fat layer exposed: Secondary | ICD-10-CM | POA: Insufficient documentation

## 2018-02-25 DIAGNOSIS — T8131XD Disruption of external operation (surgical) wound, not elsewhere classified, subsequent encounter: Secondary | ICD-10-CM | POA: Insufficient documentation

## 2018-02-25 DIAGNOSIS — Y832 Surgical operation with anastomosis, bypass or graft as the cause of abnormal reaction of the patient, or of later complication, without mention of misadventure at the time of the procedure: Secondary | ICD-10-CM | POA: Diagnosis not present

## 2018-02-25 DIAGNOSIS — T85613A Breakdown (mechanical) of artificial skin graft and decellularized allodermis, initial encounter: Secondary | ICD-10-CM | POA: Diagnosis not present

## 2018-02-25 DIAGNOSIS — I87331 Chronic venous hypertension (idiopathic) with ulcer and inflammation of right lower extremity: Secondary | ICD-10-CM | POA: Insufficient documentation

## 2018-02-26 NOTE — Progress Notes (Signed)
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  overall he tells me that generally this is not giving him much trouble. In fact he even feels maybe a little bit better than last week. We have continue with the 4-layer compression wrap in which she tolerates very well at this point. He is continuing to utilize the [place]. 06/12/16 I think there has been some progression in the status of both of these wounds over today again covered in a gelatinous surface. Has been using Hydrofera Blue. We had used Iodoflex in the past I'm not sure if there was an issue other than changing to something that might progress towards closure faster  06/19/16; he did not tolerate the Flexeril last week secondary to pain and this was changed on Friday back to [facility] area he continues to have copious amounts of gelatinous surface slough which is think inhibiting the speed of healing this area 06/26/16 patient over the last week has utilized the Santyl to try to loosen up some of the tightly adherent slough that was noted on evaluation last week. The good news is he tells me that the medial malleoli region really does not bother him the right llateral malleoli region is more tender to palpation at this point in time especially in the central/inferior location. However it does appear that the Santyl has done his job to loosen up the adherent slough at this point in time. Fortunately he has no interval signs or symptoms of infection locally or systemically no purulent discharge noted. 07/03/16 at this point in time today patient's wounds appear to be significantly improved over the right medial and lateral malleolus locations. He has much less tenderness at this point in time and the wounds appear clean her although there is still adherent slough this is sufficiently improved over what I saw last week. I still see no evidence of local infection. 07/10/16; continued gradual improvement in the right medial and lateral malleolus locations. The lateral is  more substantial wound now divided into 2 by a rim of normal epithelialization. 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 He denies any complaints of pain or intolerance to compression. Both ulcers are smaller in size oriented today's measurements, both are covered with a softly adherent slough. 07/24/16; medial wound is smaller, lateral about the same although surface looks better. Still using Hydrofera Blue 07/31/16; arrives today complaining of pain in the lateral part of his foot. Nurse reports a lot more drainage. He has been using Hydrofera Blue. Switch to silver alginate today 08/03/2016 -- I was asked to see the patient was here for a nurse visit today. I understand he had a lot of pain in his right lower extremity and was having blisters on his right foot which have not been there before. Though he started on doxycycline he does not have blisters elsewhere on his body. I do not believe this is a drug allergy. also mentioned that there was a copious purulent discharge from the wound and clinically there is no evidence of cellulitis. 08/07/16; I note that the patient came in for his nurse check on Friday apparently with blisters on his toes on the right than a lot of swelling in his forefoot. He continued on the doxycycline that I had prescribed on 12/8. A culture was done of the lateral wound that showed a combination of a few Proteus and Pseudomonas. Doxycycline might of covered the Proteus but would be unlikely to cover the Pseudomonas. He is on Coumadin. He arrives in the clinic today feeling a lot better states the pain is a lot better but nothing specific really was done other than to rewrap the foot also noted that he had arterial studies ordered in August although these were never done. It is reasonable to go ahead and reorder these. 08/14/16; generally arrives  in a better state today in terms of the wounds he has taken cefdinir for one week. Our intake nurse reports copious amounts of drainage but the patient is complaining of much less pain. He is not had his PT and INR checked  and I've asked him to do this today or tomorrow. 08/24/2016 -- patient arrives today after 10 days and said he had a stomach upset. His arterial study was done and I have reviewed this report and find it to be within normal limits. However I did not note any venous duplex studies for reflux, and Dr. Leanord Hawking may have ordered these in the past but I will leave it to him to decide if he needs these. The patient has finished his course of cefdinir. 08/28/16; patient arrives today again with copious amounts of thick really green drainage for our intake nurse. He states he has a very tender spot at the superior part of the lateral wound. Wounds are larger 09/04/16; no real change in the condition of this patient's wound still copious amounts of surface slough. Started him on Iodoflex last week he is completing another course of Cefdinir or which I think was done empirically. His arterial study showed ABIs were 1.1 on the right 1.5 on the left. He did have a slightly reduced ABI in the right the left one was not obtained. Had calcification of the right posterior tibial artery. The interpretation was no segmental stenosis. His waveforms were triphasic. [name], [name] ([id number]) His reflux studies are later this month. Depending on this I'll send him for a vascular consultation, he may need to see plastic surgery as I believe he is had plastic surgery on this foot in the past. He had an injury to the foot in the 1980s. 1/16 /18 right lateral greater than right medial ankle wounds on the right in the setting of previous skin grafting. 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 He does not have arterial  issues. Each week he comes in with the same adherent surface slough although there was less of this today 09/18/16; right lateral greater than right medial lower extremity wounds in the setting of previous skin grafting and trauma. He has least to vein laser ablation scheduled for February 2 for venous reflux. He does not have significant arterial disease. Problem has been very difficult to handle surface slough/necrotic tissue. Recently using Iodoflex for this with some, albeit slow improvement 09/25/16; right lateral greater than right medial lower extremity venous wounds in the setting of previous skin grafting. He is going for ablation surgery on February 2 after this he'll come back here for rewrap. He has been using Iodoflex as the primary dressing. 10/02/16; right lateral greater than right medial lower extremity wounds in the setting of previous skin grafting. He had his ablation surgery last week, I don't have a report. He tolerated this well. Came in with a thigh-high Unna boots on Friday. We have been using Iodoflex as the primary dressing. His measurements are improving 10/09/16; continues to make nice aggressive in terms of the wounds on his lateral and medial right ankle in the setting of previous skin grafting. Yesterday he noticed drainage at one of his surgical sites from his venous ablation on the right calf. He took off the bandage over this area felt a "popping" sensation and a reddish-brown drainage. He is not complaining of any pain 10/16/16; he continues to make nice progression in terms of the wounds on the lateral and medial malleolus. Both smaller using Iodoflex. He had a surgical area in his posterior mid calf we have been using iodoform. All the wounds are down and dimensions 10/23/16; the  patient arrives today with no complaints. He states the Iodoflex is a bit uncomfortable. He is not systemically unwell. We have been using Iodoflex to the lateral right ankle and the medial  and Aquacel Ag to the reflux surgical wound on the posterior right calf. All of these wounds are doing well 10/30/16; patient states he has no pain no systemic symptoms. I changed him to [facility] last week. Although the wounds are doing well 11/06/16; patient reports no pain or systemic symptoms. We continue with Hydrofera Blue. Both wound areas on the medial and lateral ankle appear to be doing well with improvement and dimensions and improvement in the wound bed. 11/13/16; patient's dimensions continued to improve. We continue with Hydrofera Blue on the medial and lateral side. Appear to be doing well with healthy granulation and advancing epithelialization 11/20/16; patient's dimensions improving laterally by about half a centimeter in length. Otherwise no change on the medial side. Using [facility] 12/04/16; no major change in patient's wound dimensions. Intake nurse reports more drainage. 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 He is voicing no complaints or concerns. He has been tolerating his twice weekly compression therapy changes 12/11/16 Patient complains of pain and increased drainage.. wants hydrofera blue 12/18/16 improvement. Sorbact 12/25/16; medial wound is smaller, lateral measures the same. Still on sorbact 01/01/17; medial wound continues to be smaller, lateral measures about the same however there is clearly advancing epithelialization here as well in fact I think the wound will ultimately divided into 2 open areas 01/08/17; unfortunately today fairly significant regression in several areas. Surface of the lateral wound covered again in adherent necrotic material which is difficult to debridement. He has significant surrounding skin maceration. The expanding area of tissue epithelialization in the middle of the wound that was encouraging last week appears to be  smaller. There is no surrounding tenderness. The area on the medial leg also did not seem to be as healthy as last week, the reason for this regression this week is not totally clear. We have been using Sorbact for the last 4 weeks. We'll switched of polymen AG which we will order via home medical supply. If there is a problem with this would switch back to Iodoflex 01/15/18; drainage,odor. No change. Switched to polymen last week 01/22/17; still continuous drainage. Culture I did last week showed a few Proteus pansensitive. I did this culture because of drainage. Put him on Augmentin which she has been taking since Saturday however he is developed 4-5 liquid bowel movements. He is also on Coumadin. Beyond this wound is not changed at all, still nonviable necrotic surface material which I debrided reveals healthy granulation line 01/29/17; still copious amounts of drainage reported by her intake nurse. Wound measuring slightly smaller. Currently fact on Iodoflex although I'm looking forward to changing back to perhaps [facility] or polymen AG 02/05/17; still large amounts of drainage and presenting with really large amounts of adherent slough and necrotic material over the remaining open area of the wound. We have been using Iodoflex but with little improvement in the surface. Change to [facility] 02/12/17; still large amount of drainage. Much less adherent slough however. Started [employer]	[place] last week JADON, HARBAUGH ([id number]) 02/19/17; drainage is better this week. Much less adherent slough. Perhaps some improvement in dimensions. Using [facility] 02/26/17; severe venous insufficiency wounds on the right lateral and right medial leg. Drainage is  some better and slough is less adherent we've been using Hydrofera Blue 03/05/17 on evaluation today patient appears to be doing well. His wounds have been decreasing in size and overall he is pleased with how this is progressing. We are  awaiting approval for the epigraph which has previously been recommended in the meantime the [facility] Dressing is to be doing very well for him. 03/12/17; wound dimensions are smaller still using Hydrofera Blue. Comes in on Fridays for a dressing change. 03/19/17; wound dimensions continued to contract. Healing of this wound is complicated by continuous significant drainage as well as recurrent buildup of necrotic surface material. We looked into Apligraf and he has a $290 co-pay per application but truthfully I think the drainage as well as the nonviable surface would preclude use of Apligraf there are any other skin substitute at this point therefore the continued plan will be debridement each clinic visit, 2 times a week dressing changes and continued use of Hydrofera Blue. improvement has been very slow but sustained 03/26/17 perhaps slight improvements in peripheral epithelialization is especially inferiorly. Still with large amount of drainage and tightly adherent necrotic surface on arrival. Along with the intake nurse I reviewed previous treatment. He worsened on Iodoflex and had a 4 week trial of sorbact, Polymen AG and a long courses of Aquacel Ag. He is not a candidate for advanced treatment options for many reasons 04/09/17 on evaluation today patient's right lower extremity wounds appear to be doing a little better. Fortunately he has no significant discomfort and has been tolerating the dressing changes including the wraps without complication. With that being said he really does not have swelling anymore compared to what he has had in the past following his vascular intervention. He wonders if potentially we could attempt avoiding the rats to see if he could cleanse the wound in between to try to prevent some of the fiber and its buildup that occurs in the interim between when we see him week to week. No fevers, chills, nausea, or vomiting noted at this time. 04/16/17; noted that  the staff made a choice last week not to put him in compression. The patient is changing his dressing at home using [facility] and changing every second day. His wife is washing the wound with saline. He is using kerlix. Surprisingly he has not developed a lot of edema. This choice was made because of the degree of fluid retention and maceration even with changing his dressing twice a week 04/23/17; absolutely no change. Using Hydrofera Blue. Recurrent tightly adherent nonviable surface material. This is been refractory to Iodoflex, sorbact and now Hydrofera Blue. More recently he has been changing his own dressings at home, cleansing the wound in the shower. He has not developed lower extremity edema 04/30/17; if anything the wound is larger still in adherent surface although it debrided easily today. I've been using Mehta honey for 1 week and I'd like to try and do it for a second week this see if we can get some form of viable surface here. 05/07/17; patient arrives with copious amounts of drainage and some pain in the superior part of the wound. He has not been systemically unwell. He's been changing this daily at home 05/14/17; patient arrives today complaining of less drainage and less pain. Dimensions slightly better. Surface culture I did of this last week grew MSSA I put him on dicloxacillin for 10 days. Patient requesting a further prescription since he feels so much better. He did  not obtain the [facility] AG for reasons that aren't clear, he has been using Hydrofera Blue 05/21/17; perhaps some less drainage and less pain. He is completing another week of doxycycline. Unfortunately there is no change in the wound measurements are appearance still tightly adherent necrotic surface material that is really defied treatment. We have been using Hydrofera Blue most recently. He did not manage to get Anasept. He was told it was prescription at [facility] 05/29/17; absolutely no improvement in these  wound areas. He had remote plastic surgery with who was done at [facility] in [place] surrounding this area. This was a traumatic wound with extensive plastic surgery/skin grafting at that time. I switched him to sorbact last week to see if he can do anything about the recurrent necrotic surface and drainage. This is largely been refractory to Iodoflex/Hydrofera Blue/alginates. I believe we tried Elby Showers and more recently Medi honey l however the drainage is really too excessive 06/04/17; patient went to see Dr. Ulice Bold. Per the patient she ordered him a compression stocking. Continued the Anasept gel and sorbact was already on. No major improvement in the condition of the wounds in fact the medial wound looks larger. Again per the patient she did not feel a skin graft or operative debridement was indicated The patient had previous venous ablation in February 2018. Last arterial studies were in December 2017 and were within normal limits 06/18/17; patient arrives back in clinic today using Anasept gel with sorbact. He changes this every day is wearing his own compression stocking. The patient actually saw Dr. Leta Baptist of plastic surgery. I've reviewed her note. The appointment was on 05/30/17. The basic issue with here was that she did not feel that skin grafts for patients with underlying stasis and edema have a good long-term success rate. She also discuss skin substitutes which has been done here as well however I have not been able to get the surface of these wounds to something that I think would support skin substitutes. This is why he felt he might need an PAZ, WINSETT. ([id number]) operative debridement more aggressively than I can do in this clinic Review of systems; is otherwise negative he feels well 07/02/17; patient has been using Anasept gel with Sorbact. He changes this every day scrubs out the wound beds. Arrives today with the wound bed looking some better. Easier  debridement 07/16/17; patient has been using Anasept gel was sorbact for about a month now. The necrotic service on his wound bed is better and the drainage is now down to minimal but we've not made any improvements in the epithelialization. Change to Santyl today. Surface of the wound is still not good enough to support skin substitutes 07/30/17 on evaluation today patient appears to be doing very well in regard to his right bimalleolus wounds. He has been tolerating the treatment with the Anasept gel and sorbact. However we were going to try and switch to Santyl to be more aggressive with these wounds. This was however cost prohibitive. Therefore we will likely go back to the sorbact. Nonetheless he is not having any significant discomfort he still is forming slough over the wound location but nothing too significant. The wounds do appear to be filling in nicely. 08/13/17; I have not seen this wound in about a month. There is not much change. The fact the area medially looks larger. He has been using sorbact and Anasept for over a month now without much effect. Arrives in clinic stating that he does not  want the area debrided 08/28/17; patient arrives with the areas on his right medial and right lateral ankle worse. The wounds of expanded there is erythema and still drainage. There is no pain and no tenderness. We had been using Keracel AG clearly not doing well. 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 He states that even the evening that we began utilizing the Iodoflex he started having burning. Subsequently this never really improved and he eventually discontinue the use of the Iodoflex altogether. Unfortunately he did not let us  know about any of this until today. I'm unsure of any specific infection at this point although I am going to obtain a wound culture today in order to see if there's anything that could potentially be causing an infection as well. It does sound as if he's had the Iodoflex before and did not have this kind of reaction although this does sound very specific for a reaction to the Iodoflex. 09/17/17 on evaluation today patient appears to be doing significantly better compared to last week's evaluation. At that time it actually appears that he did have an infection the good news is that we have been able to get this under control with switching to the [facility] solution he's not having as much pain and discomfort he still does have some erythema surrounding the medial aspect wound. He has been tolerating the Dakin's soaked gauze dressing which is excellent and that his wounds do not seem to have as much adherent slough. Overall I'm pleased with the progress she's made in last week. 11/05/17 on evaluation today patient appears to be doing better in regard to his right medial and lateral malleolus ulcers. He has been tolerating the dressing changes without complication. I do flight the Freada Bergeron is helping with additional granulation at this point in the wound beds do appear to be a little bit better. With that being said patient does not appear to have any evidence of significant infection at this point there is no erythema as he has previously noted he does note that the 30 day course of antibiotics I placed him on will end tomorrow. 11/12/17 on evaluation today patient actually appears to be doing excellent in regard to his right medial and lateral malleolus ulcers. He has been tolerating the dressing changes without complication. Fortunately he has no evidence of infection at this time and overall I believe he is progressing extremely nicely he has a lot of new epithelialization noted on evaluation  today. Patient likewise is very pleased with the progress even just since last week. 11/19/17 on evaluation today patient appears to be doing about the same in regard to his ulcerations. He does not have any additional breakdown although he really has not noted any significant improvement either over the past week. With that being said the more concerning thing at this time is that he is beginning to show signs of erythema surrounding both wounds which is what typically happens and then he will develop into a more overt infection. This has been the course for some time. I hope that doing the 30 day in a biotic cycle this past time would break that so that he would not have the same type of thing happen  again. Nonetheless it appears that is digressing back into this infected state unfortunately. With that being said he is not having any pain currently which is good he typically does start to hurt more as this progresses. Fortunately there does not appear to be any evidence of infection systemically which is good news. 11/26/17 on evaluation today patient appears to actually be doing rather well in regard to his ulcer compared to last week. He still has your theme and noted around both the medial and lateral malleolus ulcers of the right lower extremity. He does not seem to be quite as overtly infected however. He has been tolerating the dressing changes without complication which is good news. The gentamicin cream does seem to have been of benefit for him. With that being said he does actually have an appointment with Dr. Sampson Goon this morning to see if there's anything that would be recommended in the way of IV antibiotic Posten, Jasaiah C. ([id number]) therapy. Otherwise he still has slough noted on the surface of the wound. 12/03/17 on evaluation today patient presents for follow-up concerning his ongoing right lower extremity ulcers. He has been tolerating the dressing changes without complication  he states he has not really been using gentamicin on the lateral ankle and this seems to be doing very well. For that reason I think that is probably fine to put a hold on the gentamicin he states his burns and stings when he applies it and we maybe be able to just use this intermittently when things become more irritated. Other than that the good news is I did review his MRI which was performed on 11/28/17 and revealed that he has a negative MRI for abscess, osteomyelitis, or septic joint. Obviously these were the issues that I was concerned with the possibility of and I'm pleased to find that there is no problems in that regard. I did also review Dr. Jarrett Ables note from infectious disease he discussed performed some lab work which apparently was done and then subsequently was going to consider the possibility of Keflex long-term for prevention of infection although this has not been prescribed as of yet. 12/09/17 on evaluation today patient appears to be doing a little bit worse in regard to his right medial and lateral malleolus ulcers. Fortunately the wound does not seem to be significantly worse although he does have a lot more drainage especially the lateral aspect he is having a lot more pain than what he has noted more recently. Fortunately there does not appear to be any evidence of systemic infection which is good news. Again he has seen Dr. Sampson Goon but he has not been placed on the potential Keflex which was recommended as a possibility at least yet. 12/17/17 unfortunately on evaluation today the patient's wound appears to be doing much worse. He has been performing the dressing changes as directed. Upon a review of notes in epic it does appear that Dr. Jarrett Ables office specifically sending Keflex for the patient on the 16th which was last Tuesday. With that being said the patient states that though this was [facility] into [facility] that [facility] stated they never received it and subsequently  the patient never took anything. He tells me he has been in bed since Friday due to the increased pain and overall general malaise. No fevers, chills, nausea, or vomiting noted at this time. 02/25/18 on evaluation today patient appears to be doing rather well in regard to his bilateral right malleolus ulcers. He has been tolerating the dressing changes  without complication. Currently there does not appear to be any evidence of infection. Overall I'm very pleased with the progress that seems to been made up to this point. Patient History Information obtained from Patient. Social History Former smoker - 10 years ago, Marital Status - Married, Alcohol Use - Moderate, Drug Use - No History, Caffeine Use - Never. Medical And Surgical History Notes Constitutional Symptoms (General Health) Vein Filter (groin area); CODA; H/O Blood Clots; pulmonary hypertensive arterial disease Review of Systems (ROS) Constitutional Symptoms (General Health) Denies complaints or symptoms of Fever, Chills. Respiratory The patient has no complaints or symptoms. Cardiovascular Complains or has symptoms of LE edema. Psychiatric The patient has no complaints or symptoms. [name], [name] ([id number]) Objective Constitutional Well-nourished and well-hydrated in no acute distress. Vitals Time Taken: 8:07 AM, Height: 76 in, Weight: 238 lbs, BMI: 29, Temperature: 97.9 F, Pulse: 69 bpm, Respiratory Rate: 18 breaths/min, Blood Pressure: 139/67 mmHg. Respiratory normal breathing without difficulty. Psychiatric this patient is able to make decisions and demonstrates good insight into disease process. Alert and Oriented x 3. pleasant and cooperative. General Notes: Patient's wound bed shows signs of some Slough noted on the surface of the wound in regard to both. This was sharply debrided today without complication post debridement the wound beds appear to be doing much better I do think the Prisma still an  appropriate dressing for the time being. Integumentary (Hair, Skin) Wound #1 status is Open. Original cause of wound was Gradually Appeared. The wound is located on the Right,Lateral Malleolus. The wound measures 5.8cm length x 4.2cm width x 0.1cm depth; 19.132cm^2 area and 1.913cm^3 volume. There is Fat Layer (Subcutaneous Tissue) Exposed exposed. There is no tunneling or undermining noted. There is a medium amount of purulent drainage noted. The wound margin is distinct with the outline attached to the wound base. There is small (1-33%) pink granulation within the wound bed. There is a large (67-100%) amount of necrotic tissue within the wound bed including Adherent Slough. The periwound skin appearance exhibited: Scarring, Erythema. The periwound skin appearance did not exhibit: Callus, Crepitus, Excoriation, Induration, Rash, Dry/Scaly, Maceration, Atrophie Blanche, Cyanosis, Ecchymosis, Hemosiderin Staining, Mottled, Pallor, Rubor. The surrounding wound skin color is noted with erythema which is circumferential. Periwound temperature was noted as No Abnormality. The periwound has tenderness on palpation. Wound #2 status is Open. Original cause of wound was Gradually Appeared. The wound is located on the Right,Medial Malleolus. The wound measures 4cm length x 3.1cm width x 0.1cm depth; 9.739cm^2 area and 0.974cm^3 volume. There is no tunneling or undermining noted. There is a small amount of purulent drainage noted. The wound margin is distinct with the outline attached to the wound base. There is small (1-33%) red granulation within the wound bed. There is a large (67-100%) amount of necrotic tissue within the wound bed including Adherent Slough. The periwound skin appearance did not exhibit: Callus, Crepitus, Excoriation, Induration, Rash, Scarring, Dry/Scaly, Maceration, Atrophie Blanche, Cyanosis, Ecchymosis, Hemosiderin Staining, Mottled, Pallor, Rubor, Erythema. Periwound temperature was  noted as No Abnormality. The periwound has tenderness on palpation. 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 ([id number]) Procedures Wound #1 Pre-procedure diagnosis of Wound #1 is a Venous Leg Ulcer located on the Right,Lateral Malleolus .Severity of  Tissue Pre Debridement is: Fat layer exposed. There was a Excisional Skin/Subcutaneous Tissue Debridement with a total area of 24.36 sq cm performed by STONE III, HOYT E., PA-C. With the following instrument(s): Curette to remove Viable and Non-Viable tissue/material. Material removed includes Subcutaneous Tissue, Slough, and Fibrin/Exudate after achieving pain control using Lidocaine 4% Topical Solution. No specimens were taken. A time out was conducted at 08:28, prior to the start of the procedure. A Minimum amount of bleeding was controlled with Pressure. The procedure was tolerated well with a pain level of 0 throughout and a pain level of 0 following the procedure. Patient s Level of Consciousness post procedure was recorded as Awake and Alert. Post Debridement Measurements: 5.8cm length x 4.2cm width x 0.2cm depth; 3.826cm^3 volume. Character of Wound/Ulcer Post Debridement requires further debridement. Severity of Tissue Post Debridement is: Fat layer exposed. 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 There was a Excisional Skin/Subcutaneous Tissue Debridement with a total area of 12.4 sq cm performed  by STONE III, HOYT E., PA-C. With the following instrument(s): Curette to remove Viable and Non-Viable tissue/material. Material removed includes Subcutaneous Tissue, Slough, and Fibrin/Exudate after achieving pain control using Lidocaine 4% Topical Solution. No specimens were taken. A time out was conducted at 08:28, prior to the start of the procedure. A Minimum amount of bleeding was controlled with Pressure. The procedure was tolerated well with a pain level of 0 throughout and a pain level of 0 following the procedure. Patient s Level of Consciousness post procedure was recorded as Awake and Alert. Post Debridement Measurements: 4cm length x 3.1cm width x 0.2cm depth; 1.948cm^3 volume. Character of Wound/Ulcer Post Debridement requires further debridement. Severity of Tissue Post Debridement is: Fat layer exposed. Post procedure Diagnosis Wound #2: Same as Pre-Procedure Plan Wound Cleansing: Wound #1 Right,Lateral Malleolus: Clean wound with Normal Saline. Cleanse wound with mild soap and water May Shower, gently pat wound dry prior to applying new dressing. Wound #2 Right,Medial Malleolus: Clean wound with Normal Saline. Cleanse wound with mild soap and water May Shower, gently pat wound dry prior to applying new dressing. Anesthetic (add to Medication List): Wound #1 Right,Lateral Malleolus: Topical Lidocaine 4% cream applied to wound bed prior to debridement (In Clinic Only). Wound #2 Right,Medial Malleolus: Topical Lidocaine 4% cream applied to wound bed prior to debridement (In Clinic Only). Skin Barriers/Peri-Wound Care: Wound #1 Right,Lateral Malleolus: Barrier cream Wound #2 Right,Medial Malleolus: Barrier cream ALMOND, FITZGIBBON ([id number]) Primary Wound Dressing: Wound #1 Right,Lateral Malleolus: Gentamicin Sulfate Cream - only on wound bed Silver Collagen Wound #2 Right,Medial Malleolus: Gentamicin Sulfate Cream - only on wound bed Silver Collagen Secondary  Dressing: Wound #1 Right,Lateral Malleolus: Dry Gauze Conform/Kerlix Wound #2 Right,Medial Malleolus: Dry Gauze Conform/Kerlix Dressing Change Frequency: Wound #1 Right,Lateral Malleolus: Change dressing every other day. Wound #2 Right,Medial Malleolus: Change dressing every other day. Follow-up Appointments: Wound #1 Right,Lateral Malleolus: Return Appointment in 2 weeks. Wound #2 Right,Medial Malleolus: Return Appointment in 2 weeks. Edema Control: Wound #1 Right,Lateral Malleolus: Elevate legs to the level of the heart and pump ankles as often as possible Wound #2 Right,Medial Malleolus: Elevate legs to the level of the heart and pump ankles as often as possible Additional Orders / Instructions: Wound #1 Right,Lateral Malleolus: Increase protein intake. Wound #2 Right,Medial Malleolus: Increase protein intake. Medications-please add to medication  list.: Wound #1 Right,Lateral Malleolus: Other: - Vitamin C, Zinc, MVI Wound #2 Right,Medial Malleolus: Other: - Vitamin C, Zinc, MVI The following medication(s) was prescribed: lidocaine topical 4 % cream 1 1 cream topical was prescribed at facility I am going to suggest that we continue with the Current wound care measures at this point. The patient is in agreement the plan. We will subsequently see were things stand at follow-up in one weeks time. Please see above for specific wound care orders. We will see patient for re-evaluation in 1 week(s) here in the clinic. If anything worsens or changes patient will contact our office for additional recommendations. Electronic Signature(s) Signed: 02/26/2018 1:22:53 AM By: Lenda Kelp PA-C Entered By: Lenda Kelp on 02/25/2018 09:07:13 [name] ([id number]) -------------------------------------------------------------------------------- ROS/PFSH Details Patient Name: [name] Date of Service: 02/25/2018 8:00 AM Medical Record Number: [id number] Patient  Account Number: [id number] Date of Birth/Sex: 06-14-1946 (72 y.o. M) Treating RN: [name] Primary Care Provider: [name] Other Clinician: Referring Provider: [name] Treating Provider/Extender: STONE III, HOYT Weeks in Treatment: 110 Information Obtained From Patient Wound History Do you currently have one or more open woundso Yes How many open wounds do you currently haveo 1 Approximately how long have you had your woundso 5 months How have you been treating your wound(s) until nowo saline, dressing Has your wound(s) ever healed and then re-openedo Yes Have you had any lab work done in the past montho No Have you tested positive for an antibiotic resistant organism (MRSA, VRE)o No Have you tested positive for osteomyelitis (bone infection)o No Have you had any tests for circulation on your legso No Constitutional Symptoms (General Health) Complaints and Symptoms: Negative for: Fever; Chills Medical History: Past Medical History Notes: Vein Filter (groin area); CODA; H/O Blood Clots; pulmonary hypertensive arterial disease Cardiovascular Complaints and Symptoms: Positive for: LE edema Medical History: Negative for: Angina; Arrhythmia; Congestive Heart Failure; Coronary Artery Disease; Deep Vein Thrombosis; Hypertension; Hypotension; Myocardial Infarction; Peripheral Arterial Disease; Peripheral Venous Disease; Phlebitis; Vasculitis Eyes Medical History: Positive for: Cataracts - removed Negative for: Glaucoma; Optic Neuritis Ear/Nose/Mouth/Throat Medical History: Negative for: Chronic sinus problems/congestion Hematologic/Lymphatic Medical History: Negative for: Anemia; Hemophilia; Human Immunodeficiency Virus; Lymphedema; Sickle Cell Disease Respiratory Bassett, Deaven C. ([id number]) Complaints and Symptoms: No Complaints or Symptoms Medical History: Positive for: Chronic Obstructive Pulmonary Disease (COPD) Negative for: Aspiration; Asthma;  Pneumothorax; Sleep Apnea; Tuberculosis Gastrointestinal Medical History: Negative for: Cirrhosis ; Colitis; Crohnos; Hepatitis A; Hepatitis B; Hepatitis C Endocrine Medical History: Negative for: Type I Diabetes; Type II Diabetes Genitourinary Medical History: Negative for: End Stage Renal Disease Immunological Medical History: Negative for: Lupus Erythematosus; Raynaudos Integumentary (Skin) Medical History: Negative for: History of Burn; History of pressure wounds Musculoskeletal Medical History: Positive for: Osteoarthritis Negative for: Gout; Rheumatoid Arthritis; Osteomyelitis Neurologic Medical History: Negative for: Dementia; Neuropathy; Quadriplegia; Paraplegia; Seizure Disorder Oncologic Medical History: Negative for: Received Chemotherapy; Received Radiation Psychiatric Complaints and Symptoms: No Complaints or Symptoms Medical History: Negative for: Anorexia/bulimia; Confinement Anxiety HBO Extended History Items Eyes: Cataracts [place], Jakyri C. ([id number]) Immunizations Pneumococcal Vaccine: Received Pneumococcal Vaccination: No Implantable Devices Family and Social History Former smoker - 10 years ago; Marital Status - Married; Alcohol Use: Moderate; Drug Use: No History; Caffeine Use: Never; Advanced Directives: No; Patient does not want information on Advanced Directives; Living Will: No; Medical Power of Attorney: No Physician Affirmation I have reviewed and agree with the above information. Electronic Signature(s) Signed: 02/25/2018 4:10:39 PM By: Alejandro Mulling  Signed: 02/26/2018 1:22:53 AM By: Lenda Kelp PA-C Entered By: Lenda Kelp on 02/25/2018 09:06:27 [name] ([id number]) -------------------------------------------------------------------------------- SuperBill Details Patient Name: [name] Date of Service: 02/25/2018 Medical Record Number: [id number] Patient Account Number: [id number] Date of Birth/Sex:  [birth year] (72 y.o. 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, initial encounter T81.31XD Disruption of external operation (surgical) wound, not elsewhere classified, subsequent encounter Facility Procedures CPT4 Code: 78295621 Description: 11042 - DEB SUBQ TISSUE 20 SQ CM/< ICD-10 Diagnosis Description L97.312 Non-pressure chronic ulcer of right ankle with fat layer expo Modifier: sed Quantity: 1 CPT4 Code: 30865784 Description: 11045 - DEB SUBQ TISS EA ADDL 20CM ICD-10 Diagnosis Description L97.312 Non-pressure chronic ulcer of right ankle with fat layer expo Modifier: sed Quantity: 1 Physician Procedures CPT4 Code: 6962952 Description: 11042 - WC PHYS SUBQ TISS 20 SQ CM ICD-10 Diagnosis Description L97.312 Non-pressure chronic ulcer of right ankle with fat layer expos Modifier: ed Quantity: 1 CPT4 Code: 8413244 Description: 11045 - WC PHYS SUBQ TISS EA ADDL 20 CM ICD-10 Diagnosis Description L97.312 Non-pressure chronic ulcer of right ankle with fat layer expos Modifier: ed Quantity: 1 Electronic Signature(s) Signed: 02/26/2018 1:22:53 AM By: Lenda Kelp PA-C Entered By: Lenda Kelp on 02/25/2018 09:07:35

## 2018-02-27 NOTE — Progress Notes (Signed)
[name], Leanard C. ([id number]) Visit Report for 02/25/2018 Arrival Information Details Patient Name: [name], Argyle C. Date of Service: 02/25/2018 8:00 AM Medical Record Number: [id number] Patient Account Number: [id number] Date of Birth/Sex: 1945/12/31 34(71 y.o. M) Treating RN: Curtis Sitesorthy, Joanna Primary Care Finnick Orosz: Darreld McleanMILES, LINDA Other Clinician: Referring Brynnley Dayrit: Darreld McleanMILES, LINDA Treating Courney Garrod/Extender: Linwood DibblesSTONE III, HOYT Weeks in Treatment: 110 Visit Information History Since Last Visit Added or deleted any medications: No Patient Arrived: Ambulatory Any new allergies or adverse reactions: No Arrival Time: 08:03 Had a fall or experienced change in No Accompanied By: self activities of daily living that may affect Transfer Assistance: None risk of falls: Patient Identification Verified: Yes Signs or symptoms of abuse/neglect since last visito No Secondary Verification Process Yes Hospitalized since last visit: No Completed: Implantable device outside of the clinic excluding No Patient Requires Transmission-Based No cellular tissue based products placed in the center Precautions: since last visit: Patient Has Alerts: Yes Has Dressing in Place as Prescribed: Yes Patient Alerts: Patient on Blood Pain Present Now: No Thinner Electronic Signature(s) Signed: 02/25/2018 4:10:25 PM By: Curtis Sitesorthy, Joanna Entered By: Curtis Sitesorthy, Joanna on 02/25/2018 08:06:58 [name], Lynell C. ([id number]) -------------------------------------------------------------------------------- Encounter Discharge Information Details Patient Name: [name], Antonyo C. Date of Service: 02/25/2018 8:00 AM Medical Record Number: [id number] Patient Account Number: [id number] Date of Birth/Sex: 1945/12/31 [age] y.o. M) Treating RN: Renne CriglerFlinchum, Cheryl Primary Care Filomena Pokorney: Darreld McleanMILES, LINDA Other Clinician: Referring Angelena Sand: Darreld McleanMILES, LINDA Treating Spruha Weight/Extender: Linwood DibblesSTONE III, HOYT Weeks in Treatment: 110 Encounter Discharge  Information Items Discharge Condition: Stable Ambulatory Status: Ambulatory Discharge Destination: Home Transportation: Private Auto Schedule Follow-up Appointment: Yes Clinical Summary of Care: Electronic Signature(s) Signed: 02/26/2018 3:34:51 PM By: Renne CriglerFlinchum, Cheryl Entered By: Renne CriglerFlinchum, Cheryl on 02/25/2018 08:48:36 [name], Eldar C. ([id number]) -------------------------------------------------------------------------------- Lower Extremity Assessment Details Patient Name: [name], Xavyer C. Date of Service: 02/25/2018 8:00 AM Medical Record Number: [id number] Patient Account Number: [id number] Date of Birth/Sex: 1945/12/31 [age] y.o. M) Treating RN: Curtis Sitesorthy, Joanna Primary Care Toa Mia: Darreld McleanMILES, LINDA Other Clinician: Referring Kareema Keitt: Darreld McleanMILES, LINDA Treating Ngoc Detjen/Extender: STONE III, HOYT Weeks in Treatment: 110 Edema Assessment Assessed: [Left: No] [Right: No] [Left: Edema] [Right: :] Calf Left: Right: Point of Measurement: 40 cm From Medial Instep cm 35.1 cm Ankle Left: Right: Point of Measurement: 16 cm From Medial Instep cm 21.8 cm Vascular Assessment Pulses: Dorsalis Pedis Palpable: [Right:Yes] Posterior Tibial Extremity colors, hair growth, and conditions: Extremity Color: [Right:Hyperpigmented] Hair Growth on Extremity: [Right:No] Temperature of Extremity: [Right:Warm] Capillary Refill: [Right:< 3 seconds] Toe Nail Assessment Left: Right: Thick: Yes Discolored: Yes Deformed: No Improper Length and Hygiene: No Electronic Signature(s) Signed: 02/25/2018 4:10:25 PM By: Curtis Sitesorthy, Joanna Entered By: Curtis Sitesorthy, Joanna on 02/25/2018 08:15:08 [name], Meliton C. ([id number]) -------------------------------------------------------------------------------- Multi Wound Chart Details Patient Name: [name], Zahmir C. Date of Service: 02/25/2018 8:00 AM Medical Record Number: [id number] Patient Account Number: [id number] Date of Birth/Sex: 1945/12/31 68(71 y.o. M) Treating  RN: Phillis HaggisPinkerton, [name] Primary Care Nimrod Wendt: Darreld McleanMILES, LINDA Other Clinician: Referring Cristella Stiver: Darreld McleanMILES, LINDA Treating Benigno Check/Extender: STONE III, HOYT Weeks in Treatment: 110 Vital Signs Height(in): 76 Pulse(bpm): 69 Weight(lbs): 238 Blood Pressure(mmHg): 139/67 Body Mass Index(BMI): 29 Temperature(F): 97.9 Respiratory Rate 18 (breaths/min): Photos: [1:No Photos] [2:No Photos] [N/A:N/A] Wound Location: [1:Right Malleolus - Lateral] [2:Right Malleolus - Medial] [N/A:N/A] Wounding Event: [1:Gradually Appeared] [2:Gradually Appeared] [N/A:N/A] Primary Etiology: [1:Venous Leg Ulcer] [2:Venous Leg Ulcer] [N/A:N/A] Comorbid History: [1:Cataracts, Chronic Obstructive Cataracts, Chronic Obstructive N/A Pulmonary Disease (COPD), Pulmonary Disease (COPD), Osteoarthritis] [2:Osteoarthritis] Date Acquired: [1:08/11/2015] [2:01/30/2016] [N/A:N/A] Weeks of Treatment: [1:110] [2:108] [N/A:N/A] Wound Status: [  1:Open] [2:Open] [N/A:N/A] Clustered Wound: [1:No] [2:Yes] [N/A:N/A] Measurements L x W x D [1:5.8x4.2x0.1] [2:4x3.1x0.1] [N/A:N/A] (cm) Area (cm) : [1:19.132] [2:9.739] [N/A:N/A] Volume (cm) : [1:1.913] [2:0.974] [N/A:N/A] % Reduction in Area: [1:-10.70%] [2:15.60%] [N/A:N/A] % Reduction in Volume: [1:63.10%] [2:15.70%] [N/A:N/A] Classification: [1:Full Thickness Without Exposed Support Structures] [2:Full Thickness Without Exposed Support Structures] [N/A:N/A] Exudate Amount: [1:Medium] [2:Small] [N/A:N/A] Exudate Type: [1:Purulent] [2:Purulent] [N/A:N/A] Exudate Color: [1:yellow, brown, green] [2:yellow, brown, green] [N/A:N/A] Wound Margin: [1:Distinct, outline attached] [2:Distinct, outline attached] [N/A:N/A] Granulation Amount: [1:Small (1-33%)] [2:Small (1-33%)] [N/A:N/A] Granulation Quality: [1:Pink] [2:Red] [N/A:N/A] Necrotic Amount: [1:Large (67-100%)] [2:Large (67-100%)] [N/A:N/A] Exposed Structures: [1:Fat Layer (Subcutaneous Tissue) Exposed: Yes Fascia: No Tendon: No  Muscle: No Joint: No Bone: No] [2:Fascia: No Fat Layer (Subcutaneous Tissue) Exposed: No Tendon: No Muscle: No Joint: No Bone: No] [N/A:N/A] Epithelialization: [1:None] [2:None] [N/A:N/A] Periwound Skin Texture: [1:Scarring: Yes Excoriation: No Induration: No] [2:Excoriation: No Induration: No Callus: No] [N/A:N/A] Callus: No Crepitus: No Crepitus: No Rash: No Rash: No Scarring: No Periwound Skin Moisture: Maceration: No Maceration: No N/A Dry/Scaly: No Dry/Scaly: No Periwound Skin Color: Erythema: Yes Atrophie Blanche: No N/A Atrophie Blanche: No Cyanosis: No Cyanosis: No Ecchymosis: No Ecchymosis: No Erythema: No Hemosiderin Staining: No Hemosiderin Staining: No Mottled: No Mottled: No Pallor: No Pallor: No Rubor: No Rubor: No Erythema Location: Circumferential N/A N/A Temperature: No Abnormality No Abnormality N/A Tenderness on Palpation: Yes Yes N/A Wound Preparation: Ulcer Cleansing: Ulcer Cleansing: N/A Rinsed/Irrigated with Saline Rinsed/Irrigated with Saline Topical Anesthetic Applied: Topical Anesthetic Applied: Other: lidocaine 4% Other: LIDOCAINE 4% Treatment Notes Electronic Signature(s) Signed: 02/25/2018 4:10:39 PM By: Alejandro Mulling Entered By: Alejandro Mulling on 02/25/2018 40:98:11 [name] Stack ([id number]) -------------------------------------------------------------------------------- Multi-Disciplinary Care Plan Details Patient Name: [name] Stack Date of Service: 02/25/2018 8:00 AM Medical Record Number: [id number] Patient Account Number: [id number] Date of Birth/Sex: February 25, 1946 (72 y.o. M) Treating RN: [name] Primary Care Audreyana Huntsberry: [name] Other Clinician: Referring Shirlyn Savin: [name] Treating Bianca Raneri/Extender: STONE III, HOYT Weeks in Treatment: 110 Active Inactive ` Venous Leg Ulcer Nursing Diagnoses: Knowledge deficit related to disease process and management Goals: Patient will maintain optimal  edema control Date Initiated: 04/03/2016 Target Resolution Date: 11/24/2016 Goal Status: Active Interventions: Compression as ordered Treatment Activities: Therapeutic compression applied : 04/03/2016 Notes: ` Wound/Skin Impairment Nursing Diagnoses: Impaired tissue integrity Goals: Ulcer/skin breakdown will heal within 14 weeks Date Initiated: 01/17/2016 Target Resolution Date: 11/24/2016 Goal Status: Active Interventions: Assess ulceration(s) every visit Notes: Electronic Signature(s) Signed: 02/25/2018 4:10:39 PM By: Alejandro Mulling Entered By: Alejandro Mulling on 02/25/2018 08:25:59 [name] Stack ([id number]) -------------------------------------------------------------------------------- Pain Assessment Details Patient Name: [name] Stack Date of Service: 02/25/2018 8:00 AM Medical Record Number: [id number] Patient Account Number: [id number] Date of Birth/Sex: 1946-01-10 (72 y.o. M) Treating RN: Curtis Sites Primary Care Jhony Antrim: [name] Other Clinician: Referring Barbee Mamula: [name] Treating Jalani Rominger/Extender: STONE III, HOYT Weeks in Treatment: 110 Active Problems Location of Pain Severity and Description of Pain Patient Has Paino Yes Site Locations Pain Location: Pain in Ulcers With Dressing Change: No Duration of the Pain. Constant / Intermittento Intermittent Pain Management and Medication Current Pain Management: Goals for Pain Management ike a toothache at night Electronic Signature(s) Signed: 02/25/2018 4:10:25 PM By: Curtis Sites Entered By: Curtis Sites on 02/25/2018 08:14:38 [name] Stack ([id number]) -------------------------------------------------------------------------------- Patient/Caregiver Education Details Patient Name: [name] Stack Date of Service: 02/25/2018 8:00 AM Medical Record Number: [id number] Patient Account Number: [id number] Date of Birth/Gender: May 21, 1946 (72 y.o. M) Treating RN: Renne Crigler  Primary Care Physician: MILES, LINDA Other Clinician: Referring Physician: [name] Treating Physician/Extender: Linwood Dibbles, HOYT Weeks in Treatment: 110 Education Assessment Education Provided To: Patient Education Topics Provided Wound Debridement: Handouts: Wound Debridement Methods: Explain/Verbal Responses: State content correctly Wound/Skin Impairment: Handouts: Caring for Your Ulcer Methods: Explain/Verbal Responses: State content correctly Electronic Signature(s) Signed: 02/26/2018 3:34:51 PM By: Renne Crigler Entered By: Renne Crigler on 02/25/2018 08:48:51 Greenley, [name] ([id number]) -------------------------------------------------------------------------------- Wound Assessment Details Patient Name: [name] Stack Date of Service: 02/25/2018 8:00 AM Medical Record Number: [id number] Patient Account Number: [id number] Date of Birth/Sex: 08/26/1946 (72 y.o. M) Treating RN: Curtis Sites Primary Care Fatina Sprankle: [name] Other Clinician: Referring Ruqayyah Lute: [name] Treating Manan Olmo/Extender: STONE III, HOYT Weeks in Treatment: 110 Wound Status Wound Number: 1 Primary Venous Leg Ulcer Etiology: Wound Location: Right Malleolus - Lateral Wound Open Wounding Event: Gradually Appeared Status: Date Acquired: 08/11/2015 Comorbid Cataracts, Chronic Obstructive Pulmonary Weeks Of Treatment: 110 History: Disease (COPD), Osteoarthritis Clustered Wound: No Photos Photo Uploaded By: Curtis Sites on 02/25/2018 08:31:27 Wound Measurements Length: (cm) 5.8 Width: (cm) 4.2 Depth: (cm) 0.1 Area: (cm) 19.132 Volume: (cm) 1.913 % Reduction in Area: -10.7% % Reduction in Volume: 63.1% Epithelialization: None Tunneling: No Undermining: No Wound Description Full Thickness Without Exposed Support Classification: Structures Wound Margin: Distinct, outline attached Exudate Medium Amount: Exudate Type: Purulent Exudate Color: yellow,  brown, green Foul Odor After Cleansing: No Slough/Fibrino Yes Wound Bed Granulation Amount: Small (1-33%) Exposed Structure Granulation Quality: Pink Fascia Exposed: No Necrotic Amount: Large (67-100%) Fat Layer (Subcutaneous Tissue) Exposed: Yes Necrotic Quality: Adherent Slough Tendon Exposed: No Muscle Exposed: No Joint Exposed: No Bone Exposed: No Patin, Shrey C. ([id number]) Periwound Skin Texture Texture Color No Abnormalities Noted: No No Abnormalities Noted: No Callus: No Atrophie Blanche: No Crepitus: No Cyanosis: No Excoriation: No Ecchymosis: No Induration: No Erythema: Yes Rash: No Erythema Location: Circumferential Scarring: Yes Hemosiderin Staining: No Mottled: No Moisture Pallor: No No Abnormalities Noted: No Rubor: No Dry / Scaly: No Maceration: No Temperature / Pain Temperature: No Abnormality Tenderness on Palpation: Yes Wound Preparation Ulcer Cleansing: Rinsed/Irrigated with Saline Topical Anesthetic Applied: Other: lidocaine 4%, Treatment Notes Wound #1 (Right, Lateral Malleolus) 1. Cleansed with: Clean wound with Normal Saline 2. Anesthetic Topical Lidocaine 4% cream to wound bed prior to debridement 3. Peri-wound Care: Other peri-wound care (specify in notes) 4. Dressing Applied: Prisma Ag 5. Secondary Dressing Applied Non-Adherent pad Notes gentamycin in wound bed, conform wrap with tape and stretch net Electronic Signature(s) Signed: 02/25/2018 4:10:25 PM By: Curtis Sites Entered By: Curtis Sites on 02/25/2018 08:13:18 [name] Stack ([id number]) -------------------------------------------------------------------------------- Wound Assessment Details Patient Name: [name] Stack Date of Service: 02/25/2018 8:00 AM Medical Record Number: [id number] Patient Account Number: [id number] Date of Birth/Sex: [birth year] (72 y.o. M) Treating RN: Curtis Sites Primary Care Jazzlin Clements: [name] Other  Clinician: Referring Dannisha Eckmann: [name] Treating Neshia Mckenzie/Extender: STONE III, HOYT Weeks in Treatment: 110 Wound Status Wound Number: 2 Primary Venous Leg Ulcer Etiology: Wound Location: Right Malleolus - Medial Wound Open Wounding Event: Gradually Appeared Status: Date Acquired: 01/30/2016 Comorbid Cataracts, Chronic Obstructive Pulmonary Weeks Of Treatment: 108 History: Disease (COPD), Osteoarthritis Clustered Wound: Yes Photos Photo Uploaded By: Curtis Sites on 02/25/2018 08:31:28 Wound Measurements Length: (cm) 4 Width: (cm) 3.1 Depth: (cm) 0.1 Area: (cm) 9.739 Volume: (cm) 0.974 % Reduction in Area: 15.6% % Reduction in Volume: 15.7% Epithelialization: None Tunneling: No Undermining: No Wound Description Full Thickness Without Exposed Support Classification: Structures Wound Margin: Distinct, outline attached  Exudate Small Amount: Exudate Type: Purulent Exudate Color: yellow, brown, green Foul Odor After Cleansing: No Slough/Fibrino Yes Wound Bed Granulation Amount: Small (1-33%) Exposed Structure Granulation Quality: Red Fascia Exposed: No Necrotic Amount: Large (67-100%) Fat Layer (Subcutaneous Tissue) Exposed: No Necrotic Quality: Adherent Slough Tendon Exposed: No Muscle Exposed: No Joint Exposed: No Bone Exposed: No Feldmeier, Raphel C. ([id number]) Periwound Skin Texture Texture Color No Abnormalities Noted: No No Abnormalities Noted: No Callus: No Atrophie Blanche: No Crepitus: No Cyanosis: No Excoriation: No Ecchymosis: No Induration: No Erythema: No Rash: No Hemosiderin Staining: No Scarring: No Mottled: No Pallor: No Moisture Rubor: No No Abnormalities Noted: No Dry / Scaly: No Temperature / Pain Maceration: No Temperature: No Abnormality Tenderness on Palpation: Yes Wound Preparation Ulcer Cleansing: Rinsed/Irrigated with Saline Topical Anesthetic Applied: Other: LIDOCAINE 4%, Treatment Notes Wound #2 (Right,  Medial Malleolus) 1. Cleansed with: Clean wound with Normal Saline 2. Anesthetic Topical Lidocaine 4% cream to wound bed prior to debridement 3. Peri-wound Care: Other peri-wound care (specify in notes) 4. Dressing Applied: Prisma Ag 5. Secondary Dressing Applied Non-Adherent pad Notes gentamycin in wound bed, conform wrap with tape and stretch net Electronic Signature(s) Signed: 02/25/2018 4:10:25 PM By: Curtis Sites Entered By: Curtis Sites on 02/25/2018 08:13:33 [name] Stack ([id number]) -------------------------------------------------------------------------------- Vitals Details Patient Name: [name] Stack Date of Service: 02/25/2018 8:00 AM Medical Record Number: [id number] Patient Account Number: [id number] Date of Birth/Sex: Sep 16, 1945 (72 y.o. M) Treating RN: Curtis Sites Primary Care Clovis Mankins: [name] Other Clinician: Referring Haddon Fyfe: [name] Treating Hassen Bruun/Extender: STONE III, HOYT Weeks in Treatment: 110 Vital Signs Time Taken: 08:07 Temperature (F): 97.9 Height (in): 76 Pulse (bpm): 69 Weight (lbs): 238 Respiratory Rate (breaths/min): 18 Body Mass Index (BMI): 29 Blood Pressure (mmHg): 139/67 Reference Range: 80 - 120 mg / dl Electronic Signature(s) Signed: 02/25/2018 4:10:25 PM By: Curtis Sites Entered By: Curtis Sites on 02/25/2018 08:07:44

## 2018-03-11 ENCOUNTER — Encounter: Payer: Medicare HMO | Admitting: Physician Assistant

## 2018-03-11 DIAGNOSIS — I87331 Chronic venous hypertension (idiopathic) with ulcer and inflammation of right lower extremity: Secondary | ICD-10-CM | POA: Diagnosis not present

## 2018-03-13 NOTE — Progress Notes (Signed)
TARIQUE, LOVEALL ([id number]) Visit Report for 03/11/2018 Arrival Information Details Patient Name: [name], [name]. Date of Service: 03/11/2018 8:00 AM Medical Record Number: [id number] Patient Account Number: [id number] Date of Birth/Sex: 1946-02-04 (72 y.o. M) Treating RN: Renne Crigler Primary Care Chayton Murata: [name] Other Clinician: Referring Natori Gudino: [name] Treating Olga Bourbeau/Extender: Linwood Dibbles, HOYT Weeks in Treatment: 112 Visit Information History Since Last Visit All ordered tests and consults were completed: No Patient Arrived: Ambulatory Added or deleted any medications: No Arrival Time: 08:07 Any new allergies or adverse reactions: No Accompanied By: self Had a fall or experienced change in No Transfer Assistance: None activities of daily living that may affect Patient Identification Verified: Yes risk of falls: Secondary Verification Process Yes Signs or symptoms of abuse/neglect since last visito No Completed: Hospitalized since last visit: No Patient Requires Transmission-Based No Implantable device outside of the clinic excluding No Precautions: cellular tissue based products placed in the center Patient Has Alerts: Yes since last visit: Patient Alerts: Patient on Blood Pain Present Now: No Thinner Electronic Signature(s) Signed: 03/11/2018 4:58:15 PM By: Renne Crigler Entered By: Renne Crigler on 03/11/2018 08:07:43 [name] ([id number]) -------------------------------------------------------------------------------- Encounter Discharge Information Details Patient Name: [name] Date of Service: 03/11/2018 8:00 AM Medical Record Number: [id number] Patient Account Number: [id number] Date of Birth/Sex: [birth year] (72 y.o. M) Treating RN: Renne Crigler Primary Care Breydon Senters: [name] Other Clinician: Referring Rio Kidane: [name] Treating Arbor Leer/Extender: Linwood Dibbles, HOYT Weeks in Treatment:  112 Encounter Discharge Information Items Discharge Condition: Stable Ambulatory Status: Ambulatory Discharge Destination: Home Transportation: Private Auto Schedule Follow-up Appointment: Yes Clinical Summary of Care: Electronic Signature(s) Signed: 03/11/2018 4:58:15 PM By: Renne Crigler Entered By: Renne Crigler on 03/11/2018 08:42:39 [name] ([id number]) -------------------------------------------------------------------------------- Lower Extremity Assessment Details Patient Name: [name] Date of Service: 03/11/2018 8:00 AM Medical Record Number: [id number] Patient Account Number: [id number] Date of Birth/Sex: [birth year] (72 y.o. M) Treating RN: Renne Crigler Primary Care Elnoria Livingston: [name] Other Clinician: Referring Brooklin Rieger: [name] Treating Okey Zelek/Extender: STONE III, HOYT Weeks in Treatment: 112 Edema Assessment Assessed: [Left: No] [Right: No] [Left: Edema] [Right: :] Calf Left: Right: Point of Measurement: 40 cm From Medial Instep cm 35.5 cm Ankle Left: Right: Point of Measurement: 16 cm From Medial Instep cm 22 cm Vascular Assessment Claudication: Claudication Assessment [Right:None] Pulses: Dorsalis Pedis Palpable: [Right:Yes] Posterior Tibial Extremity colors, hair growth, and conditions: Extremity Color: [Right:Normal] Hair Growth on Extremity: [Right:Yes] Temperature of Extremity: [Right:Warm] Capillary Refill: [Right:< 3 seconds] Toe Nail Assessment Left: Right: Thick: Yes Discolored: Yes Deformed: Yes Improper Length and Hygiene: Yes Electronic Signature(s) Signed: 03/11/2018 4:58:15 PM By: Renne Crigler Entered By: Renne Crigler on 03/11/2018 08:16:38 [name] ([id number]) -------------------------------------------------------------------------------- Multi Wound Chart Details Patient Name: [name] Date of Service: 03/11/2018 8:00 AM Medical Record Number:  [id number] Patient Account Number: [id number] Date of Birth/Sex: [birth year] (72 y.o. M) Treating RN: [name] Primary Care Kalesha Irving: [name] Other Clinician: Referring Cameo Shewell: [name] Treating Desani Sprung/Extender: STONE III, HOYT Weeks in Treatment: 112 Vital Signs Height(in): 76 Pulse(bpm): 68 Weight(lbs): 238 Blood Pressure(mmHg): 158/67 Body Mass Index(BMI): 29 Temperature(F): 98.0 Respiratory Rate 18 (breaths/min): Photos: [1:No Photos] [2:No Photos] [N/A:N/A] Wound Location: [1:Right Malleolus - Lateral] [2:Right Malleolus - Medial] [N/A:N/A] Wounding Event: [1:Gradually Appeared] [2:Gradually Appeared] [N/A:N/A] Primary Etiology: [1:Venous Leg Ulcer] [2:Venous Leg Ulcer] [N/A:N/A] Comorbid History: [1:Cataracts, Chronic Obstructive Cataracts, Chronic Obstructive N/A Pulmonary Disease (COPD), Pulmonary Disease (COPD), Osteoarthritis] [2:Osteoarthritis] Date Acquired: [1:08/11/2015] [2:01/30/2016] [N/A:N/A] Weeks of Treatment: [  1:112] [2:110] [N/A:N/A] Wound Status: [1:Open] [2:Open] [N/A:N/A] Clustered Wound: [1:No] [2:Yes] [N/A:N/A] Measurements L x W x D [1:6x5x0.2] [2:3.8x3x0.2] [N/A:N/A] (cm) Area (cm) : [1:23.562] [2:8.954] [N/A:N/A] Volume (cm) : [1:4.712] [2:1.791] [N/A:N/A] % Reduction in Area: [1:-36.40%] [2:22.40%] [N/A:N/A] % Reduction in Volume: [1:9.10%] [2:-55.10%] [N/A:N/A] Classification: [1:Full Thickness Without Exposed Support Structures] [2:Full Thickness Without Exposed Support Structures] [N/A:N/A] Exudate Amount: [1:Medium] [2:Medium] [N/A:N/A] Exudate Type: [1:Purulent] [2:Purulent] [N/A:N/A] Exudate Color: [1:yellow, brown, green] [2:yellow, brown, green] [N/A:N/A] Wound Margin: [1:Distinct, outline attached] [2:Distinct, outline attached] [N/A:N/A] Granulation Amount: [1:Small (1-33%)] [2:Small (1-33%)] [N/A:N/A] Granulation Quality: [1:Pink] [2:Red] [N/A:N/A] Necrotic Amount: [1:Large (67-100%)] [2:Large (67-100%)]  [N/A:N/A] Exposed Structures: [1:Fat Layer (Subcutaneous Tissue) Exposed: Yes Fascia: No Tendon: No Muscle: No Joint: No Bone: No] [2:Fat Layer (Subcutaneous Tissue) Exposed: Yes Fascia: No Tendon: No Muscle: No Joint: No Bone: No] [N/A:N/A] Epithelialization: [1:None] [2:None] [N/A:N/A] Periwound Skin Texture: [1:Scarring: Yes Excoriation: No Induration: No] [2:Excoriation: No Induration: No Callus: No] [N/A:N/A] Callus: No Crepitus: No Crepitus: No Rash: No Rash: No Scarring: No Periwound Skin Moisture: Maceration: No Maceration: No N/A Dry/Scaly: No Dry/Scaly: No Periwound Skin Color: Erythema: Yes Atrophie Blanche: No N/A Atrophie Blanche: No Cyanosis: No Cyanosis: No Ecchymosis: No Ecchymosis: No Erythema: No Hemosiderin Staining: No Hemosiderin Staining: No Mottled: No Mottled: No Pallor: No Pallor: No Rubor: No Rubor: No Erythema Location: Circumferential N/A N/A Temperature: No Abnormality No Abnormality N/A Tenderness on Palpation: Yes Yes N/A Wound Preparation: Ulcer Cleansing: Ulcer Cleansing: N/A Rinsed/Irrigated with Saline Rinsed/Irrigated with Saline Topical Anesthetic Applied: Topical Anesthetic Applied: Other: lidocaine 4% Other: LIDOCAINE 4% Treatment Notes Electronic Signature(s) Signed: 03/12/2018 5:07:56 PM By: Alejandro Mulling Entered By: Alejandro Mulling on 03/11/2018 08:22:29 [name] ([id number]) -------------------------------------------------------------------------------- Multi-Disciplinary Care Plan Details Patient Name: [name] Date of Service: 03/11/2018 8:00 AM Medical Record Number: [id number] Patient Account Number: [id number] Date of Birth/Sex: [birth year] (72 y.o. M) Treating RN: [name] Primary Care Dreyden Rohrman: [name] Other Clinician: Referring Twisha Vanpelt: [name] Treating Shavar Gorka/Extender: STONE III, HOYT Weeks in Treatment: 112 Active Inactive ` Venous Leg Ulcer Nursing  Diagnoses: Knowledge deficit related to disease process and management Goals: Patient will maintain optimal edema control Date Initiated: 04/03/2016 Target Resolution Date: 11/24/2016 Goal Status: Active Interventions: Compression as ordered Treatment Activities: Therapeutic compression applied : 04/03/2016 Notes: ` Wound/Skin Impairment Nursing Diagnoses: Impaired tissue integrity Goals: Ulcer/skin breakdown will heal within 14 weeks Date Initiated: 01/17/2016 Target Resolution Date: 11/24/2016 Goal Status: Active Interventions: Assess ulceration(s) every visit Notes: Electronic Signature(s) Signed: 03/12/2018 5:07:56 PM By: Alejandro Mulling Entered By: Alejandro Mulling on 03/11/2018 08:22:19 [name] ([id number]) -------------------------------------------------------------------------------- Pain Assessment Details Patient Name: [name] Date of Service: 03/11/2018 8:00 AM Medical Record Number: [id number] Patient Account Number: [id number] Date of Birth/Sex: Jan 13, 1946 (72 y.o. M) Treating RN: Renne Crigler Primary Care Kam Rahimi: [name] Other Clinician: Referring Lovella Hardie: [name] Treating Mc Bloodworth/Extender: STONE III, HOYT Weeks in Treatment: 112 Active Problems Location of Pain Severity and Description of Pain Patient Has Paino No Site Locations Pain Management and Medication Current Pain Management: Electronic Signature(s) Signed: 03/11/2018 4:58:15 PM By: Renne Crigler Entered By: Renne Crigler on 03/11/2018 08:08:27 [name] ([id number]) -------------------------------------------------------------------------------- Patient/Caregiver Education Details Patient Name: [name] Date of Service: 03/11/2018 8:00 AM Medical Record Number: [id number] Patient Account Number: [id number] Date of Birth/Gender: 09/24/1945 (72 y.o. M) Treating RN: Renne Crigler Primary Care Physician: [name] Other  Clinician: Referring Physician: [name] Treating Physician/Extender: Linwood Dibbles, HOYT Weeks in Treatment: 112 Education  Assessment Education Provided To: Patient Education Topics Provided Wound Debridement: Handouts: Wound Debridement Methods: Explain/Verbal Responses: State content correctly Wound/Skin Impairment: Handouts: Caring for Your Ulcer Methods: Explain/Verbal Responses: State content correctly Electronic Signature(s) Signed: 03/11/2018 4:58:15 PM By: Renne Crigler Entered By: Renne Crigler on 03/11/2018 08:42:54 [name] ([id number]) -------------------------------------------------------------------------------- Wound Assessment Details Patient Name: [name] Date of Service: 03/11/2018 8:00 AM Medical Record Number: [id number] Patient Account Number: [id number] Date of Birth/Sex: [birth year] (72 y.o. M) Treating RN: Renne Crigler Primary Care Alonni Heimsoth: [name] Other Clinician: Referring Laurna Shetley: [name] Treating Aiven Kampe/Extender: STONE III, HOYT Weeks in Treatment: 112 Wound Status Wound Number: 1 Primary Venous Leg Ulcer Etiology: Wound Location: Right Malleolus - Lateral Wound Open Wounding Event: Gradually Appeared Status: Date Acquired: 08/11/2015 Comorbid Cataracts, Chronic Obstructive Pulmonary Weeks Of Treatment: 112 History: Disease (COPD), Osteoarthritis Clustered Wound: No Photos Photo Uploaded By: Renne Crigler on 03/11/2018 16:59:54 Wound Measurements Length: (cm) 6 Width: (cm) 5 Depth: (cm) 0.2 Area: (cm) 23.562 Volume: (cm) 4.712 % Reduction in Area: -36.4% % Reduction in Volume: 9.1% Epithelialization: None Tunneling: No Undermining: No Wound Description Full Thickness Without Exposed Support Classification: Structures Wound Margin: Distinct, outline attached Exudate Medium Amount: Exudate Type: Purulent Exudate Color: yellow, brown, green Foul Odor After Cleansing:  No Slough/Fibrino Yes Wound Bed Granulation Amount: Small (1-33%) Exposed Structure Granulation Quality: Pink Fascia Exposed: No Necrotic Amount: Large (67-100%) Fat Layer (Subcutaneous Tissue) Exposed: Yes Necrotic Quality: Adherent Slough Tendon Exposed: No Muscle Exposed: No Joint Exposed: No Bone Exposed: No [name], [name] ([id number]) Periwound Skin Texture Texture Color No Abnormalities Noted: No No Abnormalities Noted: No Callus: No Atrophie Blanche: No Crepitus: No Cyanosis: No Excoriation: No Ecchymosis: No Induration: No Erythema: Yes Rash: No Erythema Location: Circumferential Scarring: Yes Hemosiderin Staining: No Mottled: No Moisture Pallor: No No Abnormalities Noted: No Rubor: No Dry / Scaly: No Maceration: No Temperature / Pain Temperature: No Abnormality Tenderness on Palpation: Yes Wound Preparation Ulcer Cleansing: Rinsed/Irrigated with Saline Topical Anesthetic Applied: Other: lidocaine 4%, Treatment Notes Wound #1 (Right, Lateral Malleolus) 1. Cleansed with: Clean wound with Normal Saline 2. Anesthetic Topical Lidocaine 4% cream to wound bed prior to debridement 3. Peri-wound Care: Other peri-wound care (specify in notes) 4. Dressing Applied: Dry Gauze Prisma Ag Notes gentamycin, prisma, dry gauze and kerlix wrap Electronic Signature(s) Signed: 03/11/2018 4:58:15 PM By: Renne Crigler Entered By: Renne Crigler on 03/11/2018 08:14:48 [name] ([id number]) -------------------------------------------------------------------------------- Wound Assessment Details Patient Name: [name] Date of Service: 03/11/2018 8:00 AM Medical Record Number: [id number] Patient Account Number: [id number] Date of Birth/Sex: [birth year] (72 y.o. M) Treating RN: Renne Crigler Primary Care Jesselle Laflamme: [name] Other Clinician: Referring Marquasha Brutus: [name] Treating Brooks Stotz/Extender: STONE III, HOYT Weeks in  Treatment: 112 Wound Status Wound Number: 2 Primary Venous Leg Ulcer Etiology: Wound Location: Right Malleolus - Medial Wound Open Wounding Event: Gradually Appeared Status: Date Acquired: 01/30/2016 Comorbid Cataracts, Chronic Obstructive Pulmonary Weeks Of Treatment: 110 History: Disease (COPD), Osteoarthritis Clustered Wound: Yes Photos Photo Uploaded By: Renne Crigler on 03/11/2018 16:59:55 Wound Measurements Length: (cm) 3.8 Width: (cm) 3 Depth: (cm) 0.2 Area: (cm) 8.954 Volume: (cm) 1.791 % Reduction in Area: 22.4% % Reduction in Volume: -55.1% Epithelialization: None Tunneling: No Undermining: No Wound Description Full Thickness Without Exposed Support Classification: Structures Wound Margin: Distinct, outline attached Exudate Medium Amount: Exudate Type: Purulent Exudate Color: yellow, brown, green Foul Odor After Cleansing: No Slough/Fibrino Yes Wound Bed Granulation Amount: Small (1-33%) Exposed Structure Granulation Quality: Red  Fascia Exposed: No Necrotic Amount: Large (67-100%) Fat Layer (Subcutaneous Tissue) Exposed: Yes Necrotic Quality: Adherent Slough Tendon Exposed: No Muscle Exposed: No Joint Exposed: No Bone Exposed: No [name], [name] ([id number]) Periwound Skin Texture Texture Color No Abnormalities Noted: No No Abnormalities Noted: No Callus: No Atrophie Blanche: No Crepitus: No [id number]: No Excoriation: No Ecchymosis: No Induration: No Erythema: No Rash: No Hemosiderin Staining: No Scarring: No Mottled: No Pallor: No Moisture Rubor: No No Abnormalities Noted: No Dry / Scaly: No Temperature / Pain Maceration: No Temperature: No Abnormality Tenderness on Palpation: Yes Wound Preparation Ulcer Cleansing: Rinsed/Irrigated with Saline Topical Anesthetic Applied: Other: LIDOCAINE 4%, Treatment Notes Wound #2 (Right, Medial Malleolus) 1. Cleansed with: Clean wound with Normal Saline 2. Anesthetic Topical  Lidocaine 4% cream to wound bed prior to debridement 3. Peri-wound Care: Other peri-wound care (specify in notes) 4. Dressing Applied: Dry Gauze Prisma Ag Notes gentamycin, prisma, dry gauze and kerlix wrap Electronic Signature(s) Signed: 03/11/2018 4:58:15 PM By: Renne CriglerFlinchum, Cheryl Entered By: Renne CriglerFlinchum, Cheryl on 03/11/2018 08:15:21 [name], [name] ([id number]) -------------------------------------------------------------------------------- Vitals Details Patient Name: [name], [name] Date of Service: 03/11/2018 8:00 AM Medical Record Number: [id number] Patient Account Number: [id number] Date of Birth/Sex: 11/24/1945 [age] y.o. M) Treating RN: Renne CriglerFlinchum, Cheryl Primary Care Vergie Zahm: Darreld McleanMILES, LINDA Other Clinician: Referring Carrianne Hyun: Darreld McleanMILES, LINDA Treating Tamaira Ciriello/Extender: STONE III, HOYT Weeks in Treatment: 112 Vital Signs Time Taken: 08:08 Temperature (F): 98.0 Height (in): 76 Pulse (bpm): 68 Weight (lbs): 238 Respiratory Rate (breaths/min): 18 Body Mass Index (BMI): 29 Blood Pressure (mmHg): 158/67 Reference Range: 80 - 120 mg / dl Electronic Signature(s) Signed: 03/11/2018 4:58:15 PM By: Renne CriglerFlinchum, Cheryl Entered By: Renne CriglerFlinchum, Cheryl on 03/11/2018 08:08:59

## 2018-03-13 NOTE — Progress Notes (Signed)
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 watering his flowers that day. Overall he feels that his symptoms are doing significantly better at this point in time. His edema continues to be controlled well with the 4-layer compression wrap and he  really has not noted any odor at this point in time. He is tolerating the dressing changes when they are performed well. 06/05/16 at this point in time today patient currently shows no interval signs or symptoms of local or systemic infection. Again his pain level he rates to be a 1 out of 10 at most and overall he tells me that generally this is not giving him much trouble. In fact he even feels maybe a little bit better than last week. We have continue with the 4-layer compression wrap in which she tolerates very well at this  point. He is continuing to utilize the [place]. 06/12/16 I think there has been some progression in the status of both of these wounds over today again covered in a gelatinous surface. Has been using Hydrofera Blue. We had used Iodoflex in the past I'm not sure if there was an issue other than changing to something that might progress towards closure faster 06/19/16; he did not tolerate the Flexeril last week secondary to pain and this was changed on Friday back to [facility] area he continues to have copious amounts of gelatinous surface slough which is think inhibiting the speed of healing this area 06/26/16 patient over the last week has utilized the Santyl to try to loosen up some of the tightly adherent slough that was noted on evaluation last week. The good news is he tells me that the medial malleoli region really does not bother him the right llateral malleoli region is more tender to palpation at this point in time especially in the central/inferior location. However it does appear that the Santyl has done his job to loosen up the adherent slough at this point in time. Fortunately he has no interval signs or symptoms of infection locally or systemically no purulent discharge noted. 07/03/16 at this point in time today patient's wounds appear to be significantly improved over the right medial and lateral malleolus locations. He has much less  tenderness at this point in time and the wounds appear clean her although there is still adherent slough this is sufficiently improved over what I saw last week. I still see no evidence of local infection. 07/10/16; continued gradual improvement in the right medial and lateral malleolus locations. The lateral is more substantial wound now divided into 2 by a rim of normal epithelialization. 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 He denies any complaints of pain or intolerance to compression. Both ulcers are smaller in size oriented today's measurements, both are covered with a softly adherent slough. 07/24/16; medial wound is smaller, lateral about the same although surface looks better. Still using Hydrofera Blue 07/31/16; arrives today complaining of pain in the lateral part of his foot. Nurse reports a lot more drainage. He has been using Hydrofera Blue. Switch to silver alginate today 08/03/2016 -- I was asked to see the patient was here for a nurse visit today. I understand he had a lot of pain in his right lower extremity and was having blisters on his right foot which have not been there before. Though he started on doxycycline he does not have blisters elsewhere on his body. I do not believe this is a drug allergy. also mentioned that there was a copious purulent discharge from the wound and clinically there is no evidence of cellulitis. 08/07/16; I note that the patient came in for his nurse check on Friday apparently with blisters on his toes on the right than a lot of swelling in his forefoot. He continued on the doxycycline that I had prescribed on 12/8. A culture was done of the lateral wound that showed a combination of a few Proteus and Pseudomonas. Doxycycline might of covered the Proteus but would be unlikely to cover the Pseudomonas. He is on Coumadin. He  arrives in the clinic today feeling a lot better states the pain is a lot better but nothing specific really was done other than to rewrap the foot also noted that he had  arterial studies ordered in August although these were never done. It is reasonable to go ahead and reorder these. 08/14/16; generally arrives in a better state today in terms of the wounds he has taken cefdinir for one week. Our intake nurse reports copious amounts of drainage but the patient is complaining of much less pain. He is not had his PT and INR checked and I've asked him to do this today or tomorrow. 08/24/2016 -- patient arrives today after 10 days and said he had a stomach upset. His arterial study was done and I have reviewed this report and find it to be within normal limits. However I did not note any venous duplex studies for reflux, and Dr. Leanord Hawking may have ordered these in the past but I will leave it to him to decide if he needs these. The patient has finished his course of cefdinir. 08/28/16; patient arrives today again with copious amounts of thick really green drainage for our intake nurse. He states he has a very tender spot at the superior part of the lateral wound. Wounds are larger 09/04/16; no real change in the condition of this patient's wound still copious amounts of surface slough. Started him on Iodoflex last week he is completing another course of Cefdinir or which I think was done empirically. His arterial study showed ABIs were 1.1 on the right 1.5 on the left. He did have a slightly reduced ABI in the right the left one was not obtained. Had calcification of the right posterior tibial artery. The interpretation was no segmental stenosis. His waveforms were triphasic. CALIPH, BOROWIAK ([id number]) His reflux studies are later this month. Depending on this I'll send him for a vascular consultation, he may need to see plastic surgery as I believe he is had plastic surgery on this foot in the past.  He had an injury to the foot in the 1980s. 1/16 /18 right lateral greater than right medial ankle wounds on the right in the setting of previous skin grafting. 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 He does not have arterial issues. Each week he comes in with the same adherent surface slough although there was less of this today 09/18/16; right lateral greater than right medial lower extremity wounds in the setting of previous skin grafting and trauma. He has least to vein laser ablation scheduled for February 2 for venous reflux. He does not have significant arterial disease. Problem has been very difficult to handle surface slough/necrotic tissue. Recently using Iodoflex for this with some, albeit slow improvement 09/25/16; right lateral greater than right medial lower extremity venous wounds in the setting of previous skin grafting. He is going for ablation surgery on February 2 after this he'll come back here for rewrap. He has been using Iodoflex as the primary dressing. 10/02/16; right lateral greater than right medial lower extremity wounds in the setting of previous skin grafting. He had his ablation surgery last week, I don't have a report. He tolerated this well. Came in with a thigh-high Unna boots on Friday. We have been using Iodoflex as the primary dressing. His measurements are improving 10/09/16; continues to make nice aggressive in terms of the wounds on his lateral and medial right ankle in the setting of previous skin grafting. Yesterday he noticed drainage at one of his surgical sites from his venous ablation on the right calf. He took off the  bandage over this area felt a "popping" sensation and a reddish-brown drainage. He is not complaining of any pain 10/16/16; he continues to make nice progression in terms of the wounds on the lateral and medial malleolus. Both smaller using Iodoflex. He had a  surgical area in his posterior mid calf we have been using iodoform. All the wounds are down and dimensions 10/23/16; the patient arrives today with no complaints. He states the Iodoflex is a bit uncomfortable. He is not systemically unwell. We have been using Iodoflex to the lateral right ankle and the medial and Aquacel Ag to the reflux surgical wound on the posterior right calf. All of these wounds are doing well 10/30/16; patient states he has no pain no systemic symptoms. I changed him to [facility] last week. Although the wounds are doing well 11/06/16; patient reports no pain or systemic symptoms. We continue with Hydrofera Blue. Both wound areas on the medial and lateral ankle appear to be doing well with improvement and dimensions and improvement in the wound bed. 11/13/16; patient's dimensions continued to improve. We continue with Hydrofera Blue on the medial and lateral side. Appear to be doing well with healthy granulation and advancing epithelialization 11/20/16; patient's dimensions improving laterally by about half a centimeter in length. Otherwise no change on the medial side. Using [facility] 12/04/16; no major change in patient's wound dimensions. Intake nurse reports more drainage. 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 He is voicing no complaints or concerns. He has been tolerating his twice weekly compression therapy changes 12/11/16 Patient complains of pain and increased drainage.. wants hydrofera blue 12/18/16 improvement. Sorbact 12/25/16; medial wound is smaller, lateral measures the same. Still on sorbact 01/01/17; medial wound continues to be smaller, lateral measures about the same however there is clearly advancing epithelialization here as well in fact I think the wound will ultimately divided into 2 open areas 01/08/17; unfortunately today fairly significant  regression in several areas. Surface of the lateral wound covered again in adherent necrotic material which is difficult to debridement. He has significant surrounding skin maceration. The expanding area of tissue epithelialization in the middle of the wound that was encouraging last week appears to be smaller. There is no surrounding tenderness. The area on the medial leg also did not seem to be as healthy as last week, the reason for this regression this week is not totally clear. We have been using Sorbact for the last 4 weeks. We'll switched of polymen AG which we will order via home medical supply. If there is a problem with this would switch back to Iodoflex 01/15/18; drainage,odor. No change. Switched to polymen last week 01/22/17; still continuous drainage. Culture I did last week showed a few Proteus pansensitive. I did this culture because of drainage. Put him on Augmentin which she has been taking since Saturday however he is developed 4-5 liquid bowel movements. He is also on Coumadin. Beyond this wound is not changed at all, still nonviable necrotic surface material which I debrided reveals healthy granulation line 01/29/17; still copious amounts of drainage reported by her intake nurse. Wound measuring slightly smaller. Currently fact on Iodoflex although I'm looking forward to changing back to perhaps [facility] or polymen AG 02/05/17; still large amounts of drainage and presenting with really large amounts of adherent slough and necrotic material over the remaining open area of the wound. We have been using  Iodoflex but with little improvement in the surface. Change to [facility] 02/12/17; still large amount of drainage. Much less adherent slough however. Started [employer]	[place] last week ULAS, ZUERCHER ([id number]) 02/19/17; drainage is better this week. Much less adherent slough. Perhaps some improvement in dimensions. Using [facility] 02/26/17; severe venous  insufficiency wounds on the right lateral and right medial leg. Drainage is some better and slough is less adherent we've been using Hydrofera Blue 03/05/17 on evaluation today patient appears to be doing well. His wounds have been decreasing in size and overall he is pleased with how this is progressing. We are awaiting approval for the epigraph which has previously been recommended in the meantime the [facility] is to be doing very well for him. 03/12/17; wound dimensions are smaller still using Hydrofera Blue. Comes in on Fridays for a dressing change. 03/19/17; wound dimensions continued to contract. Healing of this wound is complicated by continuous significant drainage as well as recurrent buildup of necrotic surface material. We looked into Apligraf and he has a $290 co-pay per application but truthfully I think the drainage as well as the nonviable surface would preclude use of Apligraf there are any other skin substitute at this point therefore the continued plan will be debridement each clinic visit, 2 times a week dressing changes and continued use of Hydrofera Blue. improvement has been very slow but sustained 03/26/17 perhaps slight improvements in peripheral epithelialization is especially inferiorly. Still with large amount of drainage and tightly adherent necrotic surface on arrival. Along with the intake nurse I reviewed previous treatment. He worsened on Iodoflex and had a 4 week trial of sorbact, Polymen AG and a long courses of Aquacel Ag. He is not a candidate for advanced treatment options for many reasons 04/09/17 on evaluation today patient's right lower extremity wounds appear to be doing a little better. Fortunately he has no significant discomfort and has been tolerating the dressing changes including the wraps without complication. With that being said he really does not have swelling anymore compared to what he has had in the past following his vascular  intervention. He wonders if potentially we could attempt avoiding the rats to see if he could cleanse the wound in between to try to prevent some of the fiber and its buildup that occurs in the interim between when we see him week to week. No fevers, chills, nausea, or vomiting noted at this time. 04/16/17; noted that the staff made a choice last week not to put him in compression. The patient is changing his dressing at home using [facility] and changing every second day. His wife is washing the wound with saline. He is using kerlix. Surprisingly he has not developed a lot of edema. This choice was made because of the degree of fluid retention and maceration even with changing his dressing twice a week 04/23/17; absolutely no change. Using Hydrofera Blue. Recurrent tightly adherent nonviable surface material. This is been refractory to Iodoflex, sorbact and now Hydrofera Blue. More recently he has been changing his own dressings at home, cleansing the wound in the shower. He has not developed lower extremity edema 04/30/17; if anything the wound is larger still in adherent surface although it debrided easily today. I've been using Mehta honey for 1 week and I'd like to try and do it for a second week this see if we can get some form of viable surface here. 05/07/17; patient arrives with copious amounts of drainage and some pain  in the superior part of the wound. He has not been systemically unwell. He's been changing this daily at home 05/14/17; patient arrives today complaining of less drainage and less pain. Dimensions slightly better. Surface culture I did of this last week grew MSSA I put him on dicloxacillin for 10 days. Patient requesting a further prescription since he feels so much better. He did not obtain the [facility] for reasons that aren't clear, he has been using Hydrofera Blue 05/21/17; perhaps some less drainage and less pain. He is completing another week of doxycycline.  Unfortunately there is no change in the wound measurements are appearance still tightly adherent necrotic surface material that is really defied treatment. We have been using Hydrofera Blue most recently. He did not manage to get Anasept. He was told it was prescription at [facility] 05/29/17; absolutely no improvement in these wound areas. He had remote plastic surgery with who was done at [facility] in [place] surrounding this area. This was a traumatic wound with extensive plastic surgery/skin grafting at that time. I switched him to sorbact last week to see if he can do anything about the recurrent necrotic surface and drainage. This is largely been refractory to Iodoflex/Hydrofera Blue/alginates. I believe we tried Elby Showers and more recently Medi honey l however the drainage is really too excessive 06/04/17; patient went to see Dr. Ulice Bold. Per the patient she ordered him a compression stocking. Continued the Anasept gel and sorbact was already on. No major improvement in the condition of the wounds in fact the medial wound looks larger. Again per the patient she did not feel a skin graft or operative debridement was indicated The patient had previous venous ablation in February 2018. Last arterial studies were in December 2017 and were within normal limits 06/18/17; patient arrives back in clinic today using Anasept gel with sorbact. He changes this every day is wearing his own compression stocking. The patient actually saw Dr. Leta Baptist of plastic surgery. I've reviewed her note. The appointment was on 05/30/17. The basic issue with here was that she did not feel that skin grafts for patients with underlying stasis and edema have a good long-term success rate. She also discuss skin substitutes which has been done here as well however I have not been able to get the surface of these wounds to something that I think would support skin substitutes. This is why he felt he might need  an BRETTON, TANDY. ([id number]) operative debridement more aggressively than I can do in this clinic Review of systems; is otherwise negative he feels well 07/02/17; patient has been using Anasept gel with Sorbact. He changes this every day scrubs out the wound beds. Arrives today with the wound bed looking some better. Easier debridement 07/16/17; patient has been using Anasept gel was sorbact for about a month now. The necrotic service on his wound bed is better and the drainage is now down to minimal but we've not made any improvements in the epithelialization. Change to Santyl today. Surface of the wound is still not good enough to support skin substitutes 07/30/17 on evaluation today patient appears to be doing very well in regard to his right bimalleolus wounds. He has been tolerating the treatment with the Anasept gel and sorbact. However we were going to try and switch to Santyl to be more aggressive with these wounds. This was however cost prohibitive. Therefore we will likely go back to the sorbact. Nonetheless he is not having any significant discomfort he still is  forming slough over the wound location but nothing too significant. The wounds do appear to be filling in nicely. 08/13/17; I have not seen this wound in about a month. There is not much change. The fact the area medially looks larger. He has been using sorbact and Anasept for over a month now without much effect. Arrives in clinic stating that he does not want the area debrided 08/28/17; patient arrives with the areas on his right medial and right lateral ankle worse. The wounds of expanded there is erythema and still drainage. There is no pain and no tenderness. We had been using Keracel AG clearly not doing well. 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 He states that even the evening that we began utilizing the Iodoflex he started having burning. Subsequently this never really improved and he eventually discontinue the use of the Iodoflex altogether. Unfortunately he did not let us know about any of this until today. I'm unsure of any specific infection at this point although I am going to obtain a wound culture today in order to see if there's anything that could potentially be causing an infection as well. It does sound as if he's had the Iodoflex before and did not have this kind of reaction although this does sound very specific for a reaction to the Iodoflex. 09/17/17 on evaluation today patient appears to be doing significantly better compared to last week's evaluation. At that time it actually appears that he did have an infection the good news is that we have been able to get this under control with switching to the [facility] solution he's not having as much pain and discomfort he still does have some erythema surrounding the medial aspect wound. He has been tolerating the Dakin's soaked gauze dressing which is excellent and that his wounds do not seem to have as much adherent slough. Overall I'm pleased with the progress she's made in last week. 11/05/17 on evaluation today patient appears to be doing better in regard to his right medial and lateral malleolus ulcers. He has been tolerating the dressing changes without complication. I do flight the Freada Bergeron is helping with additional granulation at this point in the wound beds do appear to be a little bit better. With that being said patient does not appear to have any evidence of significant infection at this point there is no erythema as he has previously noted he does note that the 30 day course of antibiotics I placed him on will end tomorrow. 11/12/17 on evaluation today patient actually  appears to be doing excellent in regard to his right medial and lateral malleolus ulcers. He has been tolerating the dressing changes without complication. Fortunately he has no evidence of infection at this time and overall I believe he is progressing extremely nicely he has a lot of new epithelialization noted on evaluation today. Patient likewise is very pleased with the progress even just since last week. 11/19/17 on evaluation today patient appears to be doing about the same in regard to his ulcerations. He does not have any additional breakdown although he really has not noted any significant improvement either over the past week. With that being said the more concerning thing at this time  is that he is beginning to show signs of erythema surrounding both wounds which is what typically happens and then he will develop into a more overt infection. This has been the course for some time. I hope that doing the 30 day in a biotic cycle this past time would break that so that he would not have the same type of thing happen again. Nonetheless it appears that is digressing back into this infected state unfortunately. With that being said he is not having any pain currently which is good he typically does start to hurt more as this progresses. Fortunately there does not appear to be any evidence of infection systemically which is good news. 11/26/17 on evaluation today patient appears to actually be doing rather well in regard to his ulcer compared to last week. He still has your theme and noted around both the medial and lateral malleolus ulcers of the right lower extremity. He does not seem to be quite as overtly infected however. He has been tolerating the dressing changes without complication which is good news. The gentamicin cream does seem to have been of benefit for him. With that being said he does actually have an appointment with Dr. Sampson Goon this morning to see if there's anything that would  be recommended in the way of IV antibiotic Ishii, Waller C. ([id number]) therapy. Otherwise he still has slough noted on the surface of the wound. 12/03/17 on evaluation today patient presents for follow-up concerning his ongoing right lower extremity ulcers. He has been tolerating the dressing changes without complication he states he has not really been using gentamicin on the lateral ankle and this seems to be doing very well. For that reason I think that is probably fine to put a hold on the gentamicin he states his burns and stings when he applies it and we maybe be able to just use this intermittently when things become more irritated. Other than that the good news is I did review his MRI which was performed on 11/28/17 and revealed that he has a negative MRI for abscess, osteomyelitis, or septic joint. Obviously these were the issues that I was concerned with the possibility of and I'm pleased to find that there is no problems in that regard. I did also review Dr. Jarrett Ables note from infectious disease he discussed performed some lab work which apparently was done and then subsequently was going to consider the possibility of Keflex long-term for prevention of infection although this has not been prescribed as of yet. 12/09/17 on evaluation today patient appears to be doing a little bit worse in regard to his right medial and lateral malleolus ulcers. Fortunately the wound does not seem to be significantly worse although he does have a lot more drainage especially the lateral aspect he is having a lot more pain than what he has noted more recently. Fortunately there does not appear to be any evidence of systemic infection which is good news. Again he has seen Dr. Sampson Goon but he has not been placed on the potential Keflex which was recommended as a possibility at least yet. 12/17/17 unfortunately on evaluation today the patient's wound appears to be doing much worse. He has been performing  the dressing changes as directed. Upon a review of notes in epic it does appear that Dr. Jarrett Ables office specifically sending Keflex for the patient on the 16th which was last Tuesday. With that being said the patient states that though this was sent into [facility] that [facility] stated they  never received it and subsequently the patient never took anything. He tells me he has been in bed since Friday due to the increased pain and overall general malaise. No fevers, chills, nausea, or vomiting noted at this time. 02/25/18 on evaluation today patient appears to be doing rather well in regard to his bilateral right malleolus ulcers. He has been tolerating the dressing changes without complication. Currently there does not appear to be any evidence of infection. Overall I'm very pleased with the progress that seems to been made up to this point. 03/11/18 on evaluation today patient actually appears to be doing very well in regard to his right ankle ulcers. He's been tolerating the dressing changes without complication. Fortunately there does not appear to be any evidence of sniffing infection I do believe that the gentamicin cream continues to be of benefit for him. Fortunately he is showing signs of healing in which time I see him. He also tells me he's having no pain. Patient History Information obtained from Patient. Social History Former smoker - 10 years ago, Marital Status - Married, Alcohol Use - Moderate, Drug Use - No History, Caffeine Use - Never. Medical And Surgical History Notes Constitutional Symptoms (General Health) Vein Filter (groin area); CODA; H/O Blood Clots; pulmonary hypertensive arterial disease Review of Systems (ROS) Constitutional Symptoms (General Health) Denies complaints or symptoms of Fever, Chills. Respiratory The patient has no complaints or symptoms. Cardiovascular The patient has no complaints or symptoms. Psychiatric The patient has no complaints or  symptoms. BRONSON, BRESSMAN ([id number]) Objective Constitutional Well-nourished and well-hydrated in no acute distress. Vitals Time Taken: 8:08 AM, Height: 76 in, Weight: 238 lbs, BMI: 29, Temperature: 98.0 F, Pulse: 68 bpm, Respiratory Rate: 18 breaths/min, Blood Pressure: 158/67 mmHg. Respiratory normal breathing without difficulty. clear to auscultation bilaterally. Cardiovascular regular rate and rhythm with normal S1, S2. no clubbing, cyanosis, significant edema, Psychiatric this patient is able to make decisions and demonstrates good insight into disease process. Alert and Oriented x 3. pleasant and cooperative. General Notes: On inspection today patient's wound bed actually show signs of good improvement there is epithelialization noted and overall his swelling seems to be under great control. In general we haven't really known a lot of swelling recently anyway. Sharp debridement was performed and of both ulcers and he tolerated this without complication post debridement the wound bed appears to be doing significantly better. Integumentary (Hair, Skin) Wound #1 status is Open. Original cause of wound was Gradually Appeared. The wound is located on the Right,Lateral Malleolus. The wound measures 6cm length x 5cm width x 0.2cm depth; 23.562cm^2 area and 4.712cm^3 volume. There is Fat Layer (Subcutaneous Tissue) Exposed exposed. There is no tunneling or undermining noted. There is a medium amount of purulent drainage noted. The wound margin is distinct with the outline attached to the wound base. There is small (1-33%) pink granulation within the wound bed. There is a large (67-100%) amount of necrotic tissue within the wound bed including Adherent Slough. The periwound skin appearance exhibited: Scarring, Erythema. The periwound skin appearance did not exhibit: Callus, Crepitus, Excoriation, Induration, Rash, Dry/Scaly, Maceration, Atrophie Blanche, Cyanosis,  Ecchymosis, Hemosiderin Staining, Mottled, Pallor, Rubor. The surrounding wound skin color is noted with erythema which is circumferential. Periwound temperature was noted as No Abnormality. The periwound has tenderness on palpation. Wound #2 status is Open. Original cause of wound was Gradually Appeared. The wound is located on the Right,Medial Malleolus. The wound measures 3.8cm length x 3cm width x 0.2cm depth;  8.954cm^2 area and 1.791cm^3 volume. There is Fat Layer (Subcutaneous Tissue) Exposed exposed. There is no tunneling or undermining noted. There is a medium amount of purulent drainage noted. The wound margin is distinct with the outline attached to the wound base. There is small (1-33%) red granulation within the wound bed. There is a large (67-100%) amount of necrotic tissue within the wound bed including Adherent Slough. The periwound skin appearance did not exhibit: Callus, Crepitus, Excoriation, Induration, Rash, Scarring, Dry/Scaly, Maceration, Atrophie Blanche, Cyanosis, Ecchymosis, Hemosiderin Staining, Mottled, Pallor, Rubor, Erythema. Periwound temperature was noted as No Abnormality. The periwound has tenderness on palpation. 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 ([id number]) Disruption of external operation (surgical) wound, not elsewhere classified, subsequent encounter Procedures Wound #1 Pre-procedure diagnosis of Wound #1 is a Venous Leg Ulcer located on the Right,Lateral Malleolus .Severity of Tissue Pre Debridement is: Fat layer exposed. There was a Excisional Skin/Subcutaneous Tissue Debridement with a total area of 30 sq cm performed by STONE III, Kaysen Sefcik E., PA-C. With the following instrument(s):  Curette to remove Viable and Non-Viable tissue/material. Material removed includes Subcutaneous Tissue, Slough, and Fibrin/Exudate after achieving pain control using Lidocaine 4% Topical Solution. No specimens were taken. A time out was conducted at 08:24, prior to the start of the procedure. A Minimum amount of bleeding was controlled with Pressure. The procedure was tolerated well with a pain level of 0 throughout and a pain level of 0 following the procedure. Patient s Level of Consciousness post procedure was recorded as Awake and Alert. Post Debridement Measurements: 6cm length x 5cm width x 0.3cm depth; 7.069cm^3 volume. Character of Wound/Ulcer Post Debridement requires further debridement. Severity of Tissue Post Debridement is: Fat layer exposed. 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 There was a Excisional Skin/Subcutaneous Tissue Debridement with a total area of 11.4 sq cm performed by STONE III, Kemisha Bonnette E., PA-C. With the following instrument(s): Curette to remove Viable and Non-Viable tissue/material. Material removed includes Subcutaneous Tissue, Slough, and Fibrin/Exudate after achieving pain control using Lidocaine 4% Topical Solution. No specimens were taken. A time out was conducted at 08:24, prior to the start of the procedure. A Minimum amount of bleeding was controlled with Pressure. The procedure was tolerated well with a pain level of 0 throughout and a pain level of 0 following the procedure. Patient s Level of Consciousness post procedure was recorded as Awake and Alert. Post Debridement Measurements: 3.8cm length x 3cm width x 0.3cm depth; 2.686cm^3 volume. Character of Wound/Ulcer Post Debridement requires further debridement. Severity of Tissue Post Debridement is: Fat layer exposed. Post  procedure Diagnosis Wound #2: Same as Pre-Procedure Plan Wound Cleansing: Wound #1 Right,Lateral Malleolus: Clean wound with Normal Saline. Cleanse wound with mild soap and water May Shower, gently pat wound dry prior to applying new dressing. Wound #2 Right,Medial Malleolus: Clean wound with Normal Saline. Cleanse wound with mild soap and water May Shower, gently pat wound dry prior to applying new dressing. Anesthetic (add to Medication List): Wound #1 Right,Lateral Malleolus: Topical Lidocaine 4% cream applied to wound bed prior to debridement (In Clinic Only). Wound #2 Right,Medial Malleolus: Topical Lidocaine 4% cream applied  to wound bed prior to debridement (In Clinic Only). Skin Barriers/Peri-Wound Care: BRADYN, SOWARD ([id number]) Wound #1 Right,Lateral Malleolus: Barrier cream Wound #2 Right,Medial Malleolus: Barrier cream Primary Wound Dressing: Wound #1 Right,Lateral Malleolus: Gentamicin Sulfate Cream - only on wound bed Silver Collagen Wound #2 Right,Medial Malleolus: Gentamicin Sulfate Cream - only on wound bed Silver Collagen Secondary Dressing: Wound #1 Right,Lateral Malleolus: Dry Gauze Conform/Kerlix Wound #2 Right,Medial Malleolus: Dry Gauze Conform/Kerlix Dressing Change Frequency: Wound #1 Right,Lateral Malleolus: Change dressing every other day. Wound #2 Right,Medial Malleolus: Change dressing every other day. Follow-up Appointments: Wound #1 Right,Lateral Malleolus: Return Appointment in 2 weeks. Wound #2 Right,Medial Malleolus: Return Appointment in 2 weeks. Edema Control: Wound #1 Right,Lateral Malleolus: Elevate legs to the level of the heart and pump ankles as often as possible Wound #2 Right,Medial Malleolus: Elevate legs to the level of the heart and pump ankles as often as possible Additional Orders / Instructions: Wound #1 Right,Lateral Malleolus: Increase protein intake. Wound #2 Right,Medial Malleolus: Increase protein  intake. Medications-please add to medication list.: Wound #1 Right,Lateral Malleolus: Other: - Vitamin C, Zinc, MVI Wound #2 Right,Medial Malleolus: Other: - Vitamin C, Zinc, MVI The following medication(s) was prescribed: lidocaine topical 4 % cream 1 1 cream topical was prescribed at facility At this point I am going to recommend that we continue with the Current wound care measures since he seems to be doing so well. The patient is in agreement with plan. We will subsequently see were things stand at follow-up. Please see above for specific wound care orders. We will see patient for re-evaluation in 2 week(s) here in the clinic. If anything worsens or changes patient will contact our office for additional recommendations. Electronic Signature(s) Signed: 03/12/2018 1:39:21 PM By: Lorene Dy, Mehama ([id number]) Entered By: Lenda Kelp on 03/11/2018 09:10:02 [name] ([id number]) -------------------------------------------------------------------------------- ROS/PFSH Details Patient Name: [name] Date of Service: 03/11/2018 8:00 AM Medical Record Number: [id number] Patient Account Number: [id number] Date of Birth/Sex: 06-Jul-1946 (72 y.o. M) Treating RN: [name] Primary Care Provider: [name] Other Clinician: Referring Provider: [name] Treating Provider/Extender: STONE III, Velita Quirk Weeks in Treatment: 112 Information Obtained From Patient Wound History Do you currently have one or more open woundso Yes How many open wounds do you currently haveo 1 Approximately how long have you had your woundso 5 months How have you been treating your wound(s) until nowo saline, dressing Has your wound(s) ever healed and then re-openedo Yes Have you had any lab work done in the past montho No Have you tested positive for an antibiotic resistant organism (MRSA, VRE)o No Have you tested positive for osteomyelitis (bone infection)o  No Have you had any tests for circulation on your legso No Constitutional Symptoms (General Health) Complaints and Symptoms: Negative for: Fever; Chills Medical History: Past Medical History Notes: Vein Filter (groin area); CODA; H/O Blood Clots; pulmonary hypertensive arterial disease Eyes Medical History: Positive for: Cataracts - removed Negative for: Glaucoma; Optic Neuritis Ear/Nose/Mouth/Throat Medical History: Negative for: Chronic sinus problems/congestion Hematologic/Lymphatic Medical History: Negative for: Anemia; Hemophilia; Human Immunodeficiency Virus; Lymphedema; Sickle Cell Disease Respiratory Complaints and Symptoms: No Complaints or Symptoms Medical History: Positive for: Chronic Obstructive Pulmonary Disease (COPD) Negative for: Aspiration; Asthma; Pneumothorax; Sleep Apnea; Tuberculosis Cardiovascular SELIM, DURDEN. ([id number]) Complaints and Symptoms: No Complaints or Symptoms Medical History: Negative for: Angina; Arrhythmia; Congestive Heart Failure; Coronary Artery Disease; Deep Vein Thrombosis; Hypertension; Hypotension; Myocardial Infarction; Peripheral Arterial Disease; Peripheral Venous Disease; Phlebitis;  Vasculitis Gastrointestinal Medical History: Negative for: Cirrhosis ; Colitis; Crohnos; Hepatitis A; Hepatitis B; Hepatitis C Endocrine Medical History: Negative for: Type I Diabetes; Type II Diabetes Genitourinary Medical History: Negative for: End Stage Renal Disease Immunological Medical History: Negative for: Lupus Erythematosus; Raynaudos Integumentary (Skin) Medical History: Negative for: History of Burn; History of pressure wounds Musculoskeletal Medical History: Positive for: Osteoarthritis Negative for: Gout; Rheumatoid Arthritis; Osteomyelitis Neurologic Medical History: Negative for: Dementia; Neuropathy; Quadriplegia; Paraplegia; Seizure Disorder Oncologic Medical History: Negative for: Received Chemotherapy;  Received Radiation Psychiatric Complaints and Symptoms: No Complaints or Symptoms Medical History: Negative for: Anorexia/bulimia; Confinement Anxiety HBO Extended History Items SCORPIO, FORTIN. ([id number]) Eyes: Cataracts Immunizations Pneumococcal Vaccine: Received Pneumococcal Vaccination: No Implantable Devices Family and Social History Former smoker - 10 years ago; Marital Status - Married; Alcohol Use: Moderate; Drug Use: No History; Caffeine Use: Never; Advanced Directives: No; Patient does not want information on Advanced Directives; Living Will: No; Medical Power of Attorney: No Physician Affirmation I have reviewed and agree with the above information. Electronic Signature(s) Signed: 03/12/2018 1:39:21 PM By: Lenda Kelp PA-C Signed: 03/12/2018 5:07:56 PM By: Alejandro Mulling Entered By: Lenda Kelp on 03/11/2018 09:09:03 [name] ([id number]) -------------------------------------------------------------------------------- SuperBill Details Patient Name: [name] Date of Service: 03/11/2018 Medical Record Number: [id number] Patient Account Number: [id number] Date of Birth/Sex: [birth year] (72 y.o. 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, initial encounter T81.31XD Disruption of external operation (surgical) wound, not elsewhere classified, subsequent encounter Facility Procedures CPT4 Code: 08657846 Description: 11042 - DEB SUBQ TISSUE 20 SQ CM/< ICD-10 Diagnosis Description L97.312  Non-pressure chronic ulcer of right ankle with fat layer expo Modifier: sed Quantity: 1 CPT4 Code: 96295284 Description: 11045 - DEB SUBQ TISS EA ADDL 20CM ICD-10 Diagnosis Description L97.312 Non-pressure chronic ulcer of right ankle with fat layer expo Modifier: sed Quantity: 2 Physician Procedures CPT4 Code: 1324401 Description: 11042 - WC PHYS SUBQ TISS 20 SQ CM ICD-10 Diagnosis Description L97.312 Non-pressure chronic ulcer of right ankle with fat layer expos Modifier: ed Quantity: 1 CPT4 Code: [id number] Description: 11045 - WC PHYS SUBQ TISS EA ADDL 20 CM ICD-10 Diagnosis Description L97.312 Non-pressure chronic ulcer of right ankle with fat layer expos Modifier: ed Quantity: 2 Electronic Signature(s) Signed: 03/12/2018 1:39:21 PM By: Lenda Kelp PA-C Entered By: Lenda Kelp on 03/11/2018 09:10:34

## 2018-03-25 ENCOUNTER — Encounter: Payer: Medicare HMO | Admitting: Physician Assistant

## 2018-03-25 DIAGNOSIS — I87331 Chronic venous hypertension (idiopathic) with ulcer and inflammation of right lower extremity: Secondary | ICD-10-CM | POA: Diagnosis not present

## 2018-04-06 NOTE — Progress Notes (Signed)
TIA, GELB ([id number]) Visit Report for 03/25/2018 Arrival Information Details Patient Name: [name], [name]. Date of Service: 03/25/2018 8:00 AM Medical Record Number: [id number] Patient Account Number: [id number] Date of Birth/Sex: 1946/02/11 (72 y.o. M) Treating RN: Rema Jasmine Primary Care Donnelle Olmeda: [name] Other Clinician: Referring Ebrahim Deremer: [name] Treating Issis Lindseth/Extender: Linwood Dibbles, HOYT Weeks in Treatment: 114 Visit Information History Since Last Visit Added or deleted any medications: No Patient Arrived: Ambulatory Any new allergies or adverse reactions: No Arrival Time: 08:16 Had a fall or experienced change in No Accompanied By: self activities of daily living that may affect Transfer Assistance: None risk of falls: Patient Identification Verified: Yes Signs or symptoms of abuse/neglect since last visito No Secondary Verification Process Yes Hospitalized since last visit: No Completed: Implantable device outside of the clinic excluding No Patient Requires Transmission-Based No cellular tissue based products placed in the center Precautions: since last visit: Patient Has Alerts: Yes Has Dressing in Place as Prescribed: Yes Patient Alerts: Patient on Blood Pain Present Now: No Thinner Electronic Signature(s) Signed: 03/28/2018 4:42:53 PM By: Rema Jasmine Entered By: Rema Jasmine on 03/25/2018 08:16:45 [name] ([id number]) -------------------------------------------------------------------------------- Encounter Discharge Information Details Patient Name: [name] Date of Service: 03/25/2018 8:00 AM Medical Record Number: [id number] Patient Account Number: [id number] Date of Birth/Sex: [birth year] (72 y.o. M) Treating RN: Huel Coventry Primary Care Aaminah Forrester: [name] Other Clinician: Referring Madgeline Rayo: [name] Treating Brihany Butch/Extender: Linwood Dibbles, HOYT Weeks in Treatment: 114 Encounter Discharge Information  Items Discharge Condition: Stable Ambulatory Status: Ambulatory Discharge Destination: Home Transportation: Private Auto Schedule Follow-up Appointment: Yes Clinical Summary of Care: Electronic Signature(s) Signed: 03/28/2018 6:17:48 PM By: Elliot Gurney, BSN, RN, CWS, Kim RN, BSN Entered By: Elliot Gurney, BSN, RN, CWS, Kim on 03/25/2018 08:51:36 [name] ([id number]) -------------------------------------------------------------------------------- Lower Extremity Assessment Details Patient Name: [name] Date of Service: 03/25/2018 8:00 AM Medical Record Number: [id number] Patient Account Number: [id number] Date of Birth/Sex: [birth year] (72 y.o. M) Treating RN: Rema Jasmine Primary Care Ramaya Guile: [name] Other Clinician: Referring Braleigh Massoud: [name] Treating Aloise Copus/Extender: STONE III, HOYT Weeks in Treatment: 114 Edema Assessment Assessed: [Left: No] [Right: No] [Left: Edema] [Right: :] Calf Left: Right: Point of Measurement: 40 cm From Medial Instep cm 35 cm Ankle Left: Right: Point of Measurement: 16 cm From Medial Instep cm 21.5 cm Vascular Assessment Claudication: Claudication Assessment [Right:None] Pulses: Dorsalis Pedis Palpable: [Right:Yes] Posterior Tibial Extremity colors, hair growth, and conditions: Extremity Color: [Right:Hyperpigmented] Temperature of Extremity: [Right:Warm] Capillary Refill: [Right:< 3 seconds] Toe Nail Assessment Left: Right: Thick: Yes Discolored: Yes Deformed: Yes Improper Length and Hygiene: No Electronic Signature(s) Signed: 03/28/2018 4:42:53 PM By: Rema Jasmine Entered By: Rema Jasmine on 03/25/2018 08:24:23 [name] ([id number]) -------------------------------------------------------------------------------- Multi Wound Chart Details Patient Name: [name] Date of Service: 03/25/2018 8:00 AM Medical Record Number: [id number] Patient Account Number: [id number] Date of Birth/Sex: 1946-02-16 (72 y.o.  M) Treating RN: Huel Coventry Primary Care Camran Keady: [name] Other Clinician: Referring Garrette Caine: [name] Treating Lavonte Palos/Extender: Linwood Dibbles, HOYT Weeks in Treatment: 114 Vital Signs Height(in): 76 Pulse(bpm): 74 Weight(lbs): 238 Blood Pressure(mmHg): 137/64 Body Mass Index(BMI): 29 Temperature(F): 98.5 Respiratory Rate 16 (breaths/min): Photos: [1:No Photos] [2:No Photos] [N/A:N/A] Wound Location: [1:Right Malleolus - Lateral] [2:Right Malleolus - Medial] [N/A:N/A] Wounding Event: [1:Gradually Appeared] [2:Gradually Appeared] [N/A:N/A] Primary Etiology: [1:Venous Leg Ulcer] [2:Venous Leg Ulcer] [N/A:N/A] Comorbid History: [1:Cataracts, Chronic Obstructive Cataracts, Chronic Obstructive N/A Pulmonary Disease (COPD), Pulmonary Disease (COPD), Osteoarthritis] [2:Osteoarthritis] Date Acquired: [1:08/11/2015] [2:01/30/2016] [N/A:N/A] Weeks  of Treatment: [1:114] [2:112] [N/A:N/A] Wound Status: [1:Open] [2:Open] [N/A:N/A] Clustered Wound: [1:No] [2:Yes] [N/A:N/A] Measurements L x W x D [1:6x3x0.2] [2:3.8x3x0.2] [N/A:N/A] (cm) Area (cm) : [1:14.137] [2:8.954] [N/A:N/A] Volume (cm) : [1:2.827] [2:1.791] [N/A:N/A] % Reduction in Area: [1:18.20%] [2:22.40%] [N/A:N/A] % Reduction in Volume: [1:45.50%] [2:-55.10%] [N/A:N/A] Classification: [1:Full Thickness Without Exposed Support Structures] [2:Full Thickness Without Exposed Support Structures] [N/A:N/A] Exudate Amount: [1:Medium] [2:Medium] [N/A:N/A] Exudate Type: [1:Purulent] [2:Purulent] [N/A:N/A] Exudate Color: [1:yellow, brown, green] [2:yellow, brown, green] [N/A:N/A] Wound Margin: [1:Distinct, outline attached] [2:Distinct, outline attached] [N/A:N/A] Granulation Amount: [1:Small (1-33%)] [2:Small (1-33%)] [N/A:N/A] Granulation Quality: [1:Pink] [2:Red] [N/A:N/A] Necrotic Amount: [1:Large (67-100%)] [2:Large (67-100%)] [N/A:N/A] Exposed Structures: [1:Fat Layer (Subcutaneous Tissue) Exposed: Yes Fascia: No Tendon: No  Muscle: No Joint: No Bone: No] [2:Fat Layer (Subcutaneous Tissue) Exposed: Yes Fascia: No Tendon: No Muscle: No Joint: No Bone: No] [N/A:N/A] Epithelialization: [1:None] [2:None] [N/A:N/A] Periwound Skin Texture: [1:Scarring: Yes Excoriation: No Induration: No] [2:Excoriation: No Induration: No Callus: No] [N/A:N/A] Callus: No Crepitus: No Crepitus: No Rash: No Rash: No Scarring: No Periwound Skin Moisture: Maceration: No Maceration: No N/A Dry/Scaly: No Dry/Scaly: No Periwound Skin Color: Erythema: Yes Erythema: Yes N/A Atrophie Blanche: No Atrophie Blanche: No Cyanosis: No Cyanosis: No Ecchymosis: No Ecchymosis: No Hemosiderin Staining: No Hemosiderin Staining: No Mottled: No Mottled: No Pallor: No Pallor: No Rubor: No Rubor: No Erythema Location: Circumferential Circumferential N/A Temperature: No Abnormality No Abnormality N/A Tenderness on Palpation: Yes Yes N/A Wound Preparation: Ulcer Cleansing: Ulcer Cleansing: N/A Rinsed/Irrigated with Saline Rinsed/Irrigated with Saline Topical Anesthetic Applied: Topical Anesthetic Applied: Other: lidocaine 4% Other: LIDOCAINE 4% Treatment Notes Electronic Signature(s) Signed: 03/28/2018 6:17:48 PM By: Elliot Gurney, BSN, RN, CWS, Kim RN, BSN Entered By: Elliot Gurney, BSN, RN, CWS, Kim on 03/25/2018 16:10:96 [name] ([id number]) -------------------------------------------------------------------------------- Multi-Disciplinary Care Plan Details Patient Name: [name] Date of Service: 03/25/2018 8:00 AM Medical Record Number: [id number] Patient Account Number: [id number] Date of Birth/Sex: 10-24-1945 (72 y.o. M) Treating RN: Huel Coventry Primary Care Zawadi Aplin: [name] Other Clinician: Referring Tylik Treese: [name] Treating Jowan Skillin/Extender: Linwood Dibbles, HOYT Weeks in Treatment: 114 Active Inactive ` Venous Leg Ulcer Nursing Diagnoses: Knowledge deficit related to disease process and  management Goals: Patient will maintain optimal edema control Date Initiated: 04/03/2016 Target Resolution Date: 11/24/2016 Goal Status: Active Interventions: Compression as ordered Treatment Activities: Therapeutic compression applied : 04/03/2016 Notes: ` Wound/Skin Impairment Nursing Diagnoses: Impaired tissue integrity Goals: Ulcer/skin breakdown will heal within 14 weeks Date Initiated: 01/17/2016 Target Resolution Date: 11/24/2016 Goal Status: Active Interventions: Assess ulceration(s) every visit Notes: Electronic Signature(s) Signed: 03/28/2018 6:17:48 PM By: Elliot Gurney, BSN, RN, CWS, Kim RN, BSN Entered By: Elliot Gurney, BSN, RN, CWS, Kim on 03/25/2018 21:30:86 [name] ([id number]) -------------------------------------------------------------------------------- Pain Assessment Details Patient Name: [name] Date of Service: 03/25/2018 8:00 AM Medical Record Number: [id number] Patient Account Number: [id number] Date of Birth/Sex: 21-Sep-1945 (72 y.o. M) Treating RN: Rema Jasmine Primary Care Giavonni Fonder: [name] Other Clinician: Referring Rorey Bisson: [name] Treating Tanylah Schnoebelen/Extender: STONE III, HOYT Weeks in Treatment: 114 Active Problems Location of Pain Severity and Description of Pain Patient Has Paino No Site Locations Pain Management and Medication Current Pain Management: Goals for Pain Management Topical or injectable lidocaine is offered to patient for acute pain when surgical debridement is performed. If needed, Patient is instructed to use over the counter pain medication for the following 24-48 hours after debridement. Wound care MDs do not prescribed pain medications. Patient has chronic pain or uncontrolled pain. Patient has been instructed  to make an appointment with their Primary Care Physician for pain management. Electronic Signature(s) Signed: 03/28/2018 4:42:53 PM By: Rema JasmineNg, Wendi Entered By: Rema JasmineNg, Wendi on 03/25/2018 08:17:29 [name] StackBLACKWELL,  Romar C. ([id number]) -------------------------------------------------------------------------------- Patient/Caregiver Education Details Patient Name: [name] StackBLACKWELL, Ladon C. Date of Service: 03/25/2018 8:00 AM Medical Record Number: [id number] Patient Account Number: [id number] Date of Birth/Gender: [birth year](71 y.o. M) Treating RN: Huel CoventryWoody, Kim Primary Care Physician: Darreld McleanMILES, LINDA Other Clinician: Referring Physician: Darreld McleanMILES, LINDA Treating Physician/Extender: Skeet SimmerSTONE III, HOYT Weeks in Treatment: 114 Education Assessment Education Provided To: Patient Education Topics Provided Wound Debridement: Handouts: Wound Debridement Methods: Explain/Verbal Responses: State content correctly Wound/Skin Impairment: Handouts: Caring for Your Ulcer Methods: Explain/Verbal Responses: State content correctly Electronic Signature(s) Signed: 03/28/2018 6:17:48 PM By: Elliot GurneyWoody, BSN, RN, CWS, Kim RN, BSN Entered By: Elliot GurneyWoody, BSN, RN, CWS, Kim on 03/25/2018 08:51:53 [name] StackBLACKWELL, Kelvyn C. ([id number]) -------------------------------------------------------------------------------- Wound Assessment Details Patient Name: [name] StackBLACKWELL, Aldrich C. Date of Service: 03/25/2018 8:00 AM Medical Record Number: [id number] Patient Account Number: [id number] Date of Birth/Sex: [birth year](71 y.o. M) Treating RN: Rema JasmineNg, Wendi Primary Care Kylle Lall: Darreld McleanMILES, LINDA Other Clinician: Referring Kenyana Husak: Darreld McleanMILES, LINDA Treating Avry Monteleone/Extender: STONE III, HOYT Weeks in Treatment: 114 Wound Status Wound Number: 1 Primary Venous Leg Ulcer Etiology: Wound Location: Right Malleolus - Lateral Wound Open Wounding Event: Gradually Appeared Status: Date Acquired: 08/11/2015 Comorbid Cataracts, Chronic Obstructive Pulmonary Weeks Of Treatment: 114 History: Disease (COPD), Osteoarthritis Clustered Wound: No Photos Photo Uploaded By: Rema JasmineNg, Wendi on 03/25/2018 08:31:15 Wound Measurements Length: (cm) 6 Width: (cm) 3 Depth: (cm) 0.2 Area:  (cm) 14.137 Volume: (cm) 2.827 % Reduction in Area: 18.2% % Reduction in Volume: 45.5% Epithelialization: None Tunneling: No Undermining: No Wound Description Full Thickness Without Exposed Support Foul Odo Classification: Structures Slough/F Wound Margin: Distinct, outline attached Exudate Medium Amount: Exudate Type: Purulent Exudate Color: yellow, brown, green r After Cleansing: No ibrino Yes Wound Bed Granulation Amount: Small (1-33%) Exposed Structure Granulation Quality: Pink Fascia Exposed: No Necrotic Amount: Large (67-100%) Fat Layer (Subcutaneous Tissue) Exposed: Yes Necrotic Quality: Adherent Slough Tendon Exposed: No Muscle Exposed: No Joint Exposed: No Bone Exposed: No Periwound Skin Texture Texture Color Protzman, Reyes C. ([id number]) No Abnormalities Noted: No No Abnormalities Noted: No Callus: No Atrophie Blanche: No Crepitus: No Cyanosis: No Excoriation: No Ecchymosis: No Induration: No Erythema: Yes Rash: No Erythema Location: Circumferential Scarring: Yes Hemosiderin Staining: No Mottled: No Moisture Pallor: No No Abnormalities Noted: No Rubor: No Dry / Scaly: No Maceration: No Temperature / Pain Temperature: No Abnormality Tenderness on Palpation: Yes Wound Preparation Ulcer Cleansing: Rinsed/Irrigated with Saline Topical Anesthetic Applied: Other: lidocaine 4%, Treatment Notes Wound #1 (Right, Lateral Malleolus) 1. Cleansed with: Clean wound with Normal Saline 2. Anesthetic Topical Lidocaine 4% cream to wound bed prior to debridement 3. Peri-wound Care: Other peri-wound care (specify in notes) 4. Dressing Applied: Prisma Ag Notes gentamycin, prisma gauze and conform wrap with darko shoe Electronic Signature(s) Signed: 03/28/2018 4:42:53 PM By: Rema JasmineNg, Wendi Entered By: Rema JasmineNg, Wendi on 03/25/2018 08:22:02 [name] StackBLACKWELL, Juaquin C. ([id number]) -------------------------------------------------------------------------------- Wound  Assessment Details Patient Name: [name] StackBLACKWELL, Aashish C. Date of Service: 03/25/2018 8:00 AM Medical Record Number: [id number] Patient Account Number: [id number] Date of Birth/Sex: [birth year](71 y.o. M) Treating RN: Rema JasmineNg, Wendi Primary Care Niv Darley: Darreld McleanMILES, LINDA Other Clinician: Referring Isauro Skelley: Darreld McleanMILES, LINDA Treating Shamir Sedlar/Extender: STONE III, HOYT Weeks in Treatment: 114 Wound Status Wound Number: 2 Primary Venous Leg Ulcer Etiology: Wound Location: Right Malleolus - Medial Wound Open Wounding Event: Gradually Appeared Status: Date Acquired: 01/30/2016 Comorbid Cataracts, Chronic  Obstructive Pulmonary Weeks Of Treatment: 112 History: Disease (COPD), Osteoarthritis Clustered Wound: Yes Photos Photo Uploaded By: Rema Jasmine on 03/25/2018 08:31:15 Wound Measurements Length: (cm) 3.8 Width: (cm) 3 Depth: (cm) 0.2 Area: (cm) 8.954 Volume: (cm) 1.791 % Reduction in Area: 22.4% % Reduction in Volume: -55.1% Epithelialization: None Tunneling: No Undermining: No Wound Description Full Thickness Without Exposed Support Foul Odo Classification: Structures Slough/F Wound Margin: Distinct, outline attached Exudate Medium Amount: Exudate Type: Purulent Exudate Color: yellow, brown, green r After Cleansing: No ibrino Yes Wound Bed Granulation Amount: Small (1-33%) Exposed Structure Granulation Quality: Red Fascia Exposed: No Necrotic Amount: Large (67-100%) Fat Layer (Subcutaneous Tissue) Exposed: Yes Necrotic Quality: Adherent Slough Tendon Exposed: No Muscle Exposed: No Joint Exposed: No Bone Exposed: No Periwound Skin Texture Texture Color Brigante, Lopez C. ([id number]) No Abnormalities Noted: No No Abnormalities Noted: No Callus: No Atrophie Blanche: No Crepitus: No Cyanosis: No Excoriation: No Ecchymosis: No Induration: No Erythema: Yes Rash: No Erythema Location: Circumferential Scarring: No Hemosiderin Staining: No Mottled: No Moisture Pallor:  No No Abnormalities Noted: No Rubor: No Dry / Scaly: No Maceration: No Temperature / Pain Temperature: No Abnormality Tenderness on Palpation: Yes Wound Preparation Ulcer Cleansing: Rinsed/Irrigated with Saline Topical Anesthetic Applied: Other: LIDOCAINE 4%, Treatment Notes Wound #2 (Right, Medial Malleolus) 1. Cleansed with: Clean wound with Normal Saline 2. Anesthetic Topical Lidocaine 4% cream to wound bed prior to debridement 3. Peri-wound Care: Other peri-wound care (specify in notes) 4. Dressing Applied: Prisma Ag Notes gentamycin, prisma gauze and conform wrap with darko shoe Electronic Signature(s) Signed: 03/28/2018 4:42:53 PM By: Rema Jasmine Entered By: Rema Jasmine on 03/25/2018 08:22:54 [name] ([id number]) -------------------------------------------------------------------------------- Vitals Details Patient Name: [name] Date of Service: 03/25/2018 8:00 AM Medical Record Number: [id number] Patient Account Number: [id number] Date of Birth/Sex: 03-17-1946 (72 y.o. M) Treating RN: Rema Jasmine Primary Care Deja Pisarski: [name] Other Clinician: Referring Sherre Wooton: [name] Treating Irvin Bastin/Extender: STONE III, HOYT Weeks in Treatment: 114 Vital Signs Time Taken: 08:10 Temperature (F): 98.5 Height (in): 76 Pulse (bpm): 74 Weight (lbs): 238 Respiratory Rate (breaths/min): 16 Body Mass Index (BMI): 29 Blood Pressure (mmHg): 137/64 Reference Range: 80 - 120 mg / dl Electronic Signature(s) Signed: 03/28/2018 4:42:53 PM By: Rema Jasmine Entered ByRema Jasmine on 03/25/2018 08:17:53

## 2018-04-08 ENCOUNTER — Encounter: Payer: Medicare HMO | Attending: Nurse Practitioner | Admitting: Nurse Practitioner

## 2018-04-08 DIAGNOSIS — I87311 Chronic venous hypertension (idiopathic) with ulcer of right lower extremity: Secondary | ICD-10-CM | POA: Insufficient documentation

## 2018-04-08 DIAGNOSIS — L97312 Non-pressure chronic ulcer of right ankle with fat layer exposed: Secondary | ICD-10-CM | POA: Diagnosis not present

## 2018-04-08 DIAGNOSIS — T85613A Breakdown (mechanical) of artificial skin graft and decellularized allodermis, initial encounter: Secondary | ICD-10-CM | POA: Diagnosis not present

## 2018-04-08 DIAGNOSIS — T8131XD Disruption of external operation (surgical) wound, not elsewhere classified, subsequent encounter: Secondary | ICD-10-CM | POA: Diagnosis not present

## 2018-04-09 NOTE — Progress Notes (Signed)
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 think there has been some progression in the status of both of these wounds over today again covered in a gelatinous surface. Has been using Hydrofera Blue. We had used Iodoflex in the past I'm not sure if there was an issue other than changing to something that might progress towards closure  faster 06/19/16; he did not tolerate the Flexeril last week secondary to pain and this was changed on Friday back to [facility]  area he continues to have copious amounts of gelatinous surface slough which is think inhibiting the speed of healing this area 06/26/16 patient over the last week has utilized the Santyl to try to loosen up some of the tightly adherent slough that was noted on evaluation last week. The good news is he tells me that the medial malleoli region really does not bother him the right llateral malleoli region is more tender to palpation at this point in time especially in the central/inferior location. However it does appear that the Santyl has done his job to loosen up the adherent slough at this point in time. Fortunately he has no interval signs or symptoms of infection locally or systemically no purulent discharge noted. 07/03/16 at this point in time today patient's wounds appear to be significantly improved over the right medial and lateral malleolus locations. He has much less tenderness at this point in time and the wounds appear clean her although there is still adherent slough this is sufficiently improved over what I saw last week. I still see no evidence of local infection. 07/10/16; continued gradual improvement in the right medial and lateral malleolus locations. The lateral is more substantial wound now divided into 2 by a rim of normal epithelialization. 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 He denies any complaints of pain or intolerance to compression. Both ulcers are smaller in size oriented today's measurements, both are covered with a softly adherent slough. 07/24/16; medial wound is smaller, lateral about the same although surface looks better. Still using Hydrofera Blue 07/31/16; arrives today complaining of pain in the lateral  part of his foot. Nurse reports a lot more drainage. He has been using Hydrofera Blue. Switch to silver alginate today 08/03/2016 -- I was asked to see the patient was here for a nurse visit today. I understand he had a lot of pain in his right lower extremity and was having blisters on his right foot which have not been there before. Though he started on doxycycline he does not have blisters elsewhere on his body. I do not believe this is a drug allergy. also mentioned that there was a copious purulent discharge from the wound and clinically there is no evidence of cellulitis. 08/07/16; I note that the patient came in for his nurse check on Friday apparently with blisters on his toes on the right than a lot of swelling in his forefoot. He continued on the doxycycline that I had prescribed on 12/8. A culture was done of the lateral wound that showed a combination of a few Proteus and Pseudomonas. Doxycycline might of covered the Proteus but would be unlikely to cover the Pseudomonas. He is on Coumadin. He arrives in the clinic today feeling a lot better states the pain is a lot better but nothing specific really was done other than to rewrap the foot also noted that he had arterial studies ordered in August although these were never done. It is reasonable to go ahead and reorder these. 08/14/16; generally arrives in a better state today in terms of the wounds he has taken cefdinir for one week. Our intake nurse reports copious amounts of drainage but the patient is complaining of much less pain. He is not had his PT and INR checked and I've asked him to do this today or tomorrow. 08/24/2016 -- patient arrives today after 10 days and said he had a  stomach upset. His arterial study was done and I have reviewed this report and find it to be within normal limits. However I did not note any venous duplex studies for reflux, and Dr. Leanord Hawkingobson may have ordered these in the past but I will leave it to him to  decide if he needs these. The patient has finished his course of cefdinir. 08/28/16; patient arrives today again with copious amounts of thick really green drainage for our intake nurse. He states he has a very tender spot at the superior part of the lateral wound. Wounds are larger 09/04/16; no real change in the condition of this patient's wound still copious amounts of surface slough. Started him on Iodoflex last week he is completing another course of Cefdinir or which I think was done empirically. His arterial study showed ABIs were 1.1 on the right 1.5 on the left. He did have a slightly reduced ABI in the right the left one was not obtained. Had calcification of the right posterior tibial artery. The interpretation was no segmental stenosis. His waveforms were triphasic. [name] StackBLACKWELL, Damarco C. ([id number]) His reflux studies are later this month. Depending on this I'll send him for a vascular consultation, he may need to see plastic surgery as I believe he is had plastic surgery on this foot in the past. He had an injury to the foot in the 1980s. 1/16 /18 right lateral greater than right medial ankle wounds on the right in the setting of previous skin grafting. 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 He does not have arterial issues. Each week he comes in with the same adherent surface slough although there was less of this today 09/18/16; right lateral greater than right medial lower extremity wounds in the setting of previous skin grafting and trauma. He has least to vein laser ablation scheduled for February 2 for venous reflux. He does not have significant arterial disease. Problem has been very difficult to handle surface slough/necrotic tissue. Recently using Iodoflex for this with some, albeit slow improvement 09/25/16; right lateral greater than right medial lower extremity venous wounds in the setting of  previous skin grafting. He is going for ablation surgery on February 2 after this he'll come back here for rewrap. He has been using Iodoflex as the primary dressing. 10/02/16; right lateral greater than right medial lower extremity wounds in the setting of previous skin grafting. He had his ablation surgery last week, I don't have a report. He tolerated this well. Came in with a thigh-high Unna boots on Friday. We have been using Iodoflex as the primary dressing. His measurements are improving 10/09/16; continues to make nice aggressive in terms of the wounds on his lateral and medial right ankle in the setting of previous skin grafting. Yesterday he noticed drainage at one of his surgical sites from his venous ablation on the right calf. He took off the bandage over this area felt a "popping" sensation and a reddish-brown drainage. He is not complaining of any pain 10/16/16; he continues to make nice progression in terms of the wounds on the lateral and medial malleolus. Both smaller using Iodoflex. He had a surgical area in his posterior mid calf we have been using iodoform. All the wounds are down and dimensions 10/23/16; the patient arrives today with no complaints. He states the Iodoflex is a bit uncomfortable. He is not systemically unwell. We have been  using Iodoflex to the lateral right ankle and the medial and Aquacel Ag to the reflux surgical wound on the posterior right calf. All of these wounds are doing well 10/30/16; patient states he has no pain no systemic symptoms. I changed him to [facility] last week. Although the wounds are doing well 11/06/16; patient reports no pain or systemic symptoms. We continue with Hydrofera Blue. Both wound areas on the medial and lateral ankle appear to be doing well with improvement and dimensions and improvement in the wound bed. 11/13/16; patient's dimensions continued to improve. We continue with Hydrofera Blue on the medial and lateral side.  Appear to be doing well with healthy granulation and advancing epithelialization 11/20/16; patient's dimensions improving laterally by about half a centimeter in length. Otherwise no change on the medial side. Using [facility] 12/04/16; no major change in patient's wound dimensions. Intake nurse reports more drainage. 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 He is voicing no complaints or concerns. He has been tolerating his twice weekly compression therapy changes 12/11/16 Patient complains of pain and increased drainage.. wants hydrofera blue 12/18/16 improvement. Sorbact 12/25/16; medial wound is smaller, lateral measures the same. Still on sorbact 01/01/17; medial wound continues to be smaller, lateral measures about the same however there is clearly advancing epithelialization here as well in fact I think the wound will ultimately divided into 2 open areas 01/08/17; unfortunately today fairly significant regression in several areas. Surface of the lateral wound covered again in adherent necrotic material which is difficult to debridement. He has significant surrounding skin maceration. The expanding area of tissue epithelialization in the middle of the wound that was encouraging last week appears to be smaller. There is no surrounding tenderness. The area on the medial leg also did not seem to be as healthy as last week, the reason for this regression this week is not totally clear. We have been using Sorbact for the last 4 weeks. We'll switched of polymen AG which we will order via home medical supply. If there is a problem with this would switch back to Iodoflex 01/15/18; drainage,odor. No change. Switched to polymen last week 01/22/17; still continuous drainage. Culture I did last week showed a few Proteus pansensitive. I did this culture because of drainage. Put him on Augmentin which  she has been taking since Saturday however he is developed 4-5 liquid bowel movements. He is also on Coumadin. Beyond this wound is not changed at all, still nonviable necrotic surface material which I debrided reveals healthy granulation line 01/29/17; still copious amounts of drainage reported by her intake nurse. Wound measuring slightly smaller. Currently fact on Iodoflex although I'm looking forward to changing back to perhaps [facility] or polymen AG 02/05/17; still large amounts of drainage and presenting with really large amounts of adherent slough and necrotic material over the remaining open area of the wound. We have been using Iodoflex but with little improvement in the surface. Change to [facility] 02/12/17; still large amount of drainage. Much less adherent slough however. Started [employer]	[place] last week LAURENT, CARGILE ([id number]) 02/19/17; drainage is better this week. Much less adherent slough. Perhaps some improvement in dimensions. Using [facility] 02/26/17; severe venous insufficiency wounds on the right lateral and right medial leg. Drainage is some better and slough is less adherent we've been using Hydrofera Blue 03/05/17 on evaluation today patient appears to be doing well.  His wounds have been decreasing in size and overall he is pleased with how this is progressing. We are awaiting approval for the epigraph which has previously been recommended in the meantime the [facility] Dressing is to be doing very well for him. 03/12/17; wound dimensions are smaller still using Hydrofera Blue. Comes in on Fridays for a dressing change. 03/19/17; wound dimensions continued to contract. Healing of this wound is complicated by continuous significant drainage as well as recurrent buildup of necrotic surface material. We looked into Apligraf and he has a $290 co-pay per application but truthfully I think the drainage as well as the nonviable surface would preclude use of  Apligraf there are any other skin substitute at this point therefore the continued plan will be debridement each clinic visit, 2 times a week dressing changes and continued use of Hydrofera Blue. improvement has been very slow but sustained 03/26/17 perhaps slight improvements in peripheral epithelialization is especially inferiorly. Still with large amount of drainage and tightly adherent necrotic surface on arrival. Along with the intake nurse I reviewed previous treatment. He worsened on Iodoflex and had a 4 week trial of sorbact, Polymen AG and a long courses of Aquacel Ag. He is not a candidate for advanced treatment options for many reasons 04/09/17 on evaluation today patient's right lower extremity wounds appear to be doing a little better. Fortunately he has no significant discomfort and has been tolerating the dressing changes including the wraps without complication. With that being said he really does not have swelling anymore compared to what he has had in the past following his vascular intervention. He wonders if potentially we could attempt avoiding the rats to see if he could cleanse the wound in between to try to prevent some of the fiber and its buildup that occurs in the interim between when we see him week to week. No fevers, chills, nausea, or vomiting noted at this time. 04/16/17; noted that the staff made a choice last week not to put him in compression. The patient is changing his dressing at home using [facility] and changing every second day. His wife is washing the wound with saline. He is using kerlix. Surprisingly he has not developed a lot of edema. This choice was made because of the degree of fluid retention and maceration even with changing his dressing twice a week 04/23/17; absolutely no change. Using Hydrofera Blue. Recurrent tightly adherent nonviable surface material. This is been refractory to Iodoflex, sorbact and now Hydrofera Blue. More recently he has  been changing his own dressings at home, cleansing the wound in the shower. He has not developed lower extremity edema 04/30/17; if anything the wound is larger still in adherent surface although it debrided easily today. I've been using Mehta honey for 1 week and I'd like to try and do it for a second week this see if we can get some form of viable surface here. 05/07/17; patient arrives with copious amounts of drainage and some pain in the superior part of the wound. He has not been systemically unwell. He's been changing this daily at home 05/14/17; patient arrives today complaining of less drainage and less pain. Dimensions slightly better. Surface culture I did of this last week grew MSSA I put him on dicloxacillin for 10 days. Patient requesting a further prescription since he feels so much better. He did not obtain the [facility] AG for reasons that aren't clear, he has been using Hydrofera Blue 05/21/17; perhaps some less drainage and  less pain. He is completing another week of doxycycline. Unfortunately there is no change in the wound measurements are appearance still tightly adherent necrotic surface material that is really defied treatment. We have been using Hydrofera Blue most recently. He did not manage to get Anasept. [facility]; absolutely no improvement in these wound areas. He had remote plastic surgery with who was done at [facility] [place] surrounding this area. This was a traumatic wound with extensive plastic surgery/skin grafting at that time. I switched him to sorbact last week to see if he can do anything about the recurrent necrotic surface and drainage. This is largely been refractory to Iodoflex/Hydrofera Blue/alginates. I believe we tried Elby ShowersSanty and more recently Medi honey l however the drainage is really too excessive 06/04/17; patient went to see Dr. Ulice Boldillingham. Per the patient she ordered him a compression stocking.  Continued the Anasept gel and sorbact was already on. No major improvement in the condition of the wounds in fact the medial wound looks larger. Again per the patient she did not feel a skin graft or operative debridement was indicated The patient had previous venous ablation in February 2018. Last arterial studies were in December 2017 and were within normal limits 06/18/17; patient arrives back in clinic today using Anasept gel with sorbact. He changes this every day is wearing his own compression stocking. The patient actually saw Dr. Leta Baptisthimmappa of plastic surgery. I've reviewed her note. The appointment was on 05/30/17. The basic issue with here was that she did not feel that skin grafts for patients with underlying stasis and edema have a good long-term success rate. She also discuss skin substitutes which has been done here as well however I have not been able to get the surface of these wounds to something that I think would support skin substitutes. This is why he felt he might need an [name] StackBLACKWELL, Jahon C. ([id number]) operative debridement more aggressively than I can do in this clinic Review of systems; is otherwise negative he feels well 07/02/17; patient has been using Anasept gel with Sorbact. He changes this every day scrubs out the wound beds. Arrives today with the wound bed looking some better. Easier debridement 07/16/17; patient has been using Anasept gel was sorbact for about a month now. The necrotic service on his wound bed is better and the drainage is now down to minimal but we've not made any improvements in the epithelialization. Change to Santyl today. Surface of the wound is still not good enough to support skin substitutes 07/30/17 on evaluation today patient appears to be doing very well in regard to his right bimalleolus wounds. He has been tolerating the treatment with the Anasept gel and sorbact. However we were going to try and switch to Santyl to be more aggressive  with these wounds. This was however cost prohibitive. Therefore we will likely go back to the sorbact. Nonetheless he is not having any significant discomfort he still is forming slough over the wound location but nothing too significant. The wounds do appear to be filling in nicely. 08/13/17; I have not seen this wound in about a month. There is not much change. The fact the area medially looks larger. He has been using sorbact and Anasept for over a month now without much effect. Arrives in clinic stating that he does not want the area debrided 08/28/17; patient arrives with the areas on his right medial and right lateral ankle worse. The wounds of  expanded there is erythema and still drainage. There is no pain and no tenderness. We had been using Keracel AG clearly not doing well. 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 He states that even the evening that we began utilizing the Iodoflex he started having burning. Subsequently this never really improved and he eventually discontinue the use of the Iodoflex altogether. Unfortunately he did not let us know about any of this until today. I'm unsure of any specific infection at this point although I am going to obtain a wound culture today in order to see if there's anything that could potentially be causing an infection as well. It does sound as if he's had the Iodoflex before and did not have this kind of reaction although this does sound very specific for a reaction to the Iodoflex. 09/17/17 on evaluation today patient appears to be doing significantly better compared to last week's evaluation. At that time it actually appears that he did have an infection the good news is that we have  been able to get this under control with switching to the [facility] solution he's not having as much pain and discomfort he still does have some erythema surrounding the medial aspect wound. He has been tolerating the Dakin's soaked gauze dressing which is excellent and that his wounds do not seem to have as much adherent slough. Overall I'm pleased with the progress she's made in last week. 11/05/17 on evaluation today patient appears to be doing better in regard to his right medial and lateral malleolus ulcers. He has been tolerating the dressing changes without complication. I do flight the Freada Bergeron is helping with additional granulation at this point in the wound beds do appear to be a little bit better. With that being said patient does not appear to have any evidence of significant infection at this point there is no erythema as he has previously noted he does note that the 30 day course of antibiotics I placed him on will end tomorrow. 11/12/17 on evaluation today patient actually appears to be doing excellent in regard to his right medial and lateral malleolus ulcers. He has been tolerating the dressing changes without complication. Fortunately he has no evidence of infection at this time and overall I believe he is progressing extremely nicely he has a lot of new epithelialization noted on evaluation today. Patient likewise is very pleased with the progress even just since last week. 11/19/17 on evaluation today patient appears to be doing about the same in regard to his ulcerations. He does not have any additional breakdown although he really has not noted any significant improvement either over the past week. With that being said the more concerning thing at this time is that he is beginning to show signs of erythema surrounding both wounds which is what typically happens and then he will develop into a more overt infection. This has been the course for some time. I hope that doing the 30 day in a  biotic cycle this past time would break that so that he would not have the same type of thing happen again. Nonetheless it appears that is digressing back into this infected state unfortunately. With that being said he is not having any  pain currently which is good he typically does start to hurt more as this progresses. Fortunately there does not appear to be any evidence of infection systemically which is good news. 11/26/17 on evaluation today patient appears to actually be doing rather well in regard to his ulcer compared to last week. He still has your theme and noted around both the medial and lateral malleolus ulcers of the right lower extremity. He does not seem to be quite as overtly infected however. He has been tolerating the dressing changes without complication which is good news. The gentamicin cream does seem to have been of benefit for him. With that being said he does actually have an appointment with Dr. Sampson Goon this morning to see if there's anything that would be recommended in the way of IV antibiotic Greenwood, Chavez C. ([id number]) therapy. Otherwise he still has slough noted on the surface of the wound. 12/03/17 on evaluation today patient presents for follow-up concerning his ongoing right lower extremity ulcers. He has been tolerating the dressing changes without complication he states he has not really been using gentamicin on the lateral ankle and this seems to be doing very well. For that reason I think that is probably fine to put a hold on the gentamicin he states his burns and stings when he applies it and we maybe be able to just use this intermittently when things become more irritated. Other than that the good news is I did review his MRI which was performed on 11/28/17 and revealed that he has a negative MRI for abscess, osteomyelitis, or septic joint. Obviously these were the issues that I was concerned with the possibility of and I'm pleased to find that there is  no problems in that regard. I did also review Dr. Jarrett Ables note from infectious disease he discussed performed some lab work which apparently was done and then subsequently was going to consider the possibility of Keflex long-term for prevention of infection although this has not been prescribed as of yet. 12/09/17 on evaluation today patient appears to be doing a little bit worse in regard to his right medial and lateral malleolus ulcers. Fortunately the wound does not seem to be significantly worse although he does have a lot more drainage especially the lateral aspect he is having a lot more pain than what he has noted more recently. Fortunately there does not appear to be any evidence of systemic infection which is good news. Again he has seen Dr. Sampson Goon but he has not been placed on the potential Keflex which was recommended as a possibility at least yet. 12/17/17 unfortunately on evaluation today the patient's wound appears to be doing much worse. He has been performing the dressing changes as directed. Upon a review of notes in epic it does appear that Dr. Jarrett Ables office specifically sending Keflex for the patient on the 16th which was last Tuesday. With that being said the patient states that though this was sent into [facility] that [facility] stated they never received it and subsequently the patient never took anything. He tells me he has been in bed since Friday due to the increased pain and overall general malaise. No fevers, chills, nausea, or vomiting noted at this time. 02/25/18 on evaluation today patient appears to be doing rather well in regard to his bilateral right malleolus ulcers. He has been tolerating the dressing changes without complication. Currently there does not appear to be any evidence of infection. Overall I'm very pleased with the progress that seems to  been made up to this point. 03/11/18 on evaluation today patient actually appears to be doing very well in  regard to his right ankle ulcers. He's been tolerating the dressing changes without complication. Fortunately there does not appear to be any evidence of sniffing infection I do believe that the gentamicin cream continues to be of benefit for him. Fortunately he is showing signs of healing in which time I see him. He also tells me he's having no pain. 03/25/18 on evaluation today patient actually appears to show signs of improvement especially in regard to the right lateral ankle ulcer. He has been tolerating the dressing changes without complication. In general I'm very pleased with the progress that has been made up to this point. Patient History Information obtained from Patient. Social History Former smoker - 10 years ago, Marital Status - Married, Alcohol Use - Moderate, Drug Use - No History, Caffeine Use - Never. Medical And Surgical History Notes Constitutional Symptoms (General Health) Vein Filter (groin area); CODA; H/O Blood Clots; pulmonary hypertensive arterial disease Review of Systems (ROS) Constitutional Symptoms (General Health) Denies complaints or symptoms of Fever, Chills. Respiratory The patient has no complaints or symptoms. Cardiovascular The patient has no complaints or symptoms. Psychiatric The patient has no complaints or symptoms. MASAKI, ROTHBAUER ([id number]) Objective Constitutional Well-nourished and well-hydrated in no acute distress. Vitals Time Taken: 8:10 AM, Height: 76 in, Weight: 238 lbs, BMI: 29, Temperature: 98.5 F, Pulse: 74 bpm, Respiratory Rate: 16 breaths/min, Blood Pressure: 137/64 mmHg. Respiratory normal breathing without difficulty. Psychiatric this patient is able to make decisions and demonstrates good insight into disease process. Alert and Oriented x 3. pleasant and cooperative. General Notes: Patient's wound currently is shown signs of continued improvement especially laterally on the medial aspect though it may be improving  it's not quite as rapid and significant as the lateral. Nonetheless I'm pleased in both regards and think that continuing with the current therapy would be most appropriate for him for the patient. Post debridement both wounds appear better. Integumentary (Hair, Skin) Wound #1 status is Open. Original cause of wound was Gradually Appeared. The wound is located on the Right,Lateral Malleolus. The wound measures 6cm length x 3cm width x 0.2cm depth; 14.137cm^2 area and 2.827cm^3 volume. There is Fat Layer (Subcutaneous Tissue) Exposed exposed. There is no tunneling or undermining noted. There is a medium amount of purulent drainage noted. The wound margin is distinct with the outline attached to the wound base. There is small (1-33%) pink granulation within the wound bed. There is a large (67-100%) amount of necrotic tissue within the wound bed including Adherent Slough. The periwound skin appearance exhibited: Scarring, Erythema. The periwound skin appearance did not exhibit: Callus, Crepitus, Excoriation, Induration, Rash, Dry/Scaly, Maceration, Atrophie Blanche, Cyanosis, Ecchymosis, Hemosiderin Staining, Mottled, Pallor, Rubor. The surrounding wound skin color is noted with erythema which is circumferential. Periwound temperature was noted as No Abnormality. The periwound has tenderness on palpation. Wound #2 status is Open. Original cause of wound was Gradually Appeared. The wound is located on the Right,Medial Malleolus. The wound measures 3.8cm length x 3cm width x 0.2cm depth; 8.954cm^2 area and 1.791cm^3 volume. There is Fat Layer (Subcutaneous Tissue) Exposed exposed. There is no tunneling or undermining noted. There is a medium amount of purulent drainage noted. The wound margin is distinct with the outline attached to the wound base. There is small (1-33%) red granulation within the wound bed. There is a large (67-100%) amount of necrotic tissue within the wound  bed including Adherent  Slough. The periwound skin appearance exhibited: Erythema. The periwound skin appearance did not exhibit: Callus, Crepitus, Excoriation, Induration, Rash, Scarring, Dry/Scaly, Maceration, Atrophie Blanche, Cyanosis, Ecchymosis, Hemosiderin Staining, Mottled, Pallor, Rubor. The surrounding wound skin color is noted with erythema which is circumferential. Periwound temperature was noted as No Abnormality. The periwound has tenderness on palpation. 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 There was a Excisional Skin/Subcutaneous Tissue Debridement with a total area of 18 sq cm performed by STONE III, Jamorion Gomillion E., PA-C. With the following instrument(s): Curette to remove Viable and Non-Viable tissue/material. Material removed includes Subcutaneous Tissue, Slough, and Biofilm after achieving pain control using Other (lidocaine 4%). No specimens were taken. A time out was conducted at 08:29, prior to the start of the procedure. A Minimum amount of bleeding was controlled with Pressure. The procedure was tolerated well with a pain level of 0 throughout and a pain level of 0 following the procedure. Patient s Level of Consciousness post procedure was recorded as Awake and Alert. Post Debridement Measurements: 6cm length x 3cm width x 0.3cm depth; 4.241cm^3 volume. Character of Wound/Ulcer Post Debridement is improved. Severity of Tissue Post Debridement is: Fat  layer exposed. 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 There was a Excisional Skin/Subcutaneous Tissue Debridement with a total area of 11.4 sq cm performed by STONE III, Maclain Cohron E., PA-C. With the following instrument(s): Curette to remove Viable and Non-Viable tissue/material. Material removed includes Subcutaneous Tissue, Slough, and Biofilm after achieving pain control using Other (lidocaine 4%). No specimens were taken. A time out was conducted at 08:29, prior to the start of the procedure. A Minimum amount of bleeding was controlled with Pressure. The procedure was tolerated well with a pain level of 0 throughout and a pain level of 0 following the procedure. Patient s Level of Consciousness post procedure was recorded as Awake and Alert. Post Debridement Measurements: 3.8cm length x 3cm width x 0.3cm depth; 2.686cm^3 volume. Character of Wound/Ulcer Post Debridement is improved. Severity of Tissue Post Debridement is: Fat layer exposed. Post procedure Diagnosis Wound #2: Same as Pre-Procedure Plan Wound Cleansing: Wound #1 Right,Lateral Malleolus: Clean wound with Normal Saline. Cleanse wound with mild soap and water May Shower, gently pat wound dry prior to applying new dressing. Wound #2 Right,Medial Malleolus: Clean wound with Normal Saline. Cleanse wound with mild soap and water May Shower, gently pat wound dry prior to applying new dressing. Anesthetic (add to Medication List): Wound #1 Right,Lateral Malleolus: Topical Lidocaine 4% cream applied to wound bed prior to debridement (In Clinic Only). Wound #2 Right,Medial Malleolus: STACEY, MAURA. ([id number]) Topical Lidocaine 4% cream applied to wound bed prior to debridement (In Clinic Only). Skin Barriers/Peri-Wound Care: Wound #1 Right,Lateral  Malleolus: Barrier cream Wound #2 Right,Medial Malleolus: Barrier cream Primary Wound Dressing: Wound #1 Right,Lateral Malleolus: Gentamicin Sulfate Cream - only on wound bed Silver Collagen Wound #2 Right,Medial Malleolus: Gentamicin  Sulfate Cream - only on wound bed Silver Collagen Secondary Dressing: Wound #1 Right,Lateral Malleolus: Dry Gauze Conform/Kerlix Wound #2 Right,Medial Malleolus: Dry Gauze Conform/Kerlix Dressing Change Frequency: Wound #1 Right,Lateral Malleolus: Change dressing every other day. Wound #2 Right,Medial Malleolus: Change dressing every other day. Follow-up Appointments: Wound #1 Right,Lateral Malleolus: Return Appointment in 2 weeks. Wound #2 Right,Medial Malleolus: Return Appointment in 2 weeks. Edema Control: Wound #1 Right,Lateral Malleolus: Elevate legs to the level of the heart and pump ankles as often as possible Wound #2 Right,Medial Malleolus: Elevate legs to the level of the heart and pump ankles as often as possible Additional Orders / Instructions: Wound #1 Right,Lateral Malleolus: Increase protein intake. Wound #2 Right,Medial Malleolus: Increase protein intake. Medications-please add to medication list.: Wound #1 Right,Lateral Malleolus: Other: - Vitamin C, Zinc, MVI Wound #2 Right,Medial Malleolus: Other: - Vitamin C, Zinc, MVI We will continue with the Current wound care measures for the next week. And the patient is in agreement the plan. If anything changes in the interim he will let us know otherwise hopefully he will continue to show signs of improvement week by week. Please see above for specific wound care orders. We will see patient for re-evaluation in 1 week(s) here in the clinic. If anything worsens or changes patient will contact our office for additional recommendations. Electronic Signature(s) Signed: 03/25/2018 9:24:47 PM By: Lorene Dy, [facility] ([id number]) Entered By: Lenda Kelp on  03/25/2018 08:39:45 [name] ([id number]) -------------------------------------------------------------------------------- ROS/PFSH Details Patient Name: [name] Date of Service: 03/25/2018 8:00 AM Medical Record Number: [id number] Patient Account Number: [id number] Date of Birth/Sex: [birth year] (72 y.o. M) Treating RN: [name] Primary Care Provider: [name] Other Clinician: Referring Provider: [name] Treating Provider/Extender: STONE III, Dorsie Burich Weeks in Treatment: 114 Information Obtained From Patient Wound History Do you currently have one or more open woundso Yes How many open wounds do you currently haveo 1 Approximately how long have you had your woundso 5 months How have you been treating your wound(s) until nowo saline, dressing Has your wound(s) ever healed and then re-openedo Yes Have you had any lab work done in the past montho No Have you tested positive for an antibiotic resistant organism (MRSA, VRE)o No Have you tested positive for osteomyelitis (bone infection)o No Have you had any tests for circulation on your legso No Constitutional Symptoms (General Health) Complaints and Symptoms: Negative for: Fever; Chills Medical History: Past Medical History Notes: Vein Filter (groin area); CODA; H/O Blood Clots; pulmonary hypertensive arterial disease Eyes Medical History: Positive for: Cataracts - removed Negative for: Glaucoma; Optic Neuritis Ear/Nose/Mouth/Throat Medical History: Negative for: Chronic sinus problems/congestion Hematologic/Lymphatic Medical History: Negative for: Anemia; Hemophilia; Human Immunodeficiency Virus; Lymphedema; Sickle Cell Disease Respiratory Complaints and Symptoms: No Complaints or Symptoms Medical History: Positive for: Chronic Obstructive Pulmonary Disease (COPD) Negative for: Aspiration; Asthma; Pneumothorax; Sleep Apnea; Tuberculosis Cardiovascular WILMAN, TUCKER.  ([id number]) Complaints and Symptoms: No Complaints or Symptoms Medical History: Negative for: Angina; Arrhythmia; Congestive Heart Failure; Coronary Artery Disease; Deep Vein Thrombosis; Hypertension; Hypotension; Myocardial Infarction; Peripheral Arterial Disease; Peripheral Venous Disease; Phlebitis; Vasculitis Gastrointestinal Medical History: Negative for: Cirrhosis ; Colitis; Crohnos; Hepatitis A; Hepatitis B; Hepatitis C Endocrine Medical History: Negative for: Type I Diabetes; Type II Diabetes Genitourinary Medical History: Negative for: End Stage Renal Disease Immunological Medical History: Negative for: Lupus Erythematosus; Raynaudos Integumentary (Skin) Medical History: Negative for: History of Burn; History of pressure wounds Musculoskeletal Medical History: Positive for: Osteoarthritis Negative  for: Gout; Rheumatoid Arthritis; Osteomyelitis Neurologic Medical History: Negative for: Dementia; Neuropathy; Quadriplegia; Paraplegia; Seizure Disorder Oncologic Medical History: Negative for: Received Chemotherapy; Received Radiation Psychiatric Complaints and Symptoms: No Complaints or Symptoms Medical History: Negative for: Anorexia/bulimia; Confinement Anxiety HBO Extended History Items IDEN, STRIPLING. ([id number]) Eyes: Cataracts Immunizations Pneumococcal Vaccine: Received Pneumococcal Vaccination: No Implantable Devices Family and Social History Former smoker - 10 years ago; Marital Status - Married; Alcohol Use: Moderate; Drug Use: No History; Caffeine Use: Never; Advanced Directives: No; Patient does not want information on Advanced Directives; Living Will: No; Medical Power of Attorney: No Physician Affirmation I have reviewed and agree with the above information. Electronic Signature(s) Signed: 03/25/2018 9:24:47 PM By: Lenda Kelp PA-C Signed: 03/31/2018 4:59:33 PM By: Alejandro Mulling Entered By: Lenda Kelp on 03/25/2018  08:38:38 [name] ([id number]) -------------------------------------------------------------------------------- SuperBill Details Patient Name: [name] Date of Service: 03/25/2018 Medical Record Number: [id number] Patient Account Number: [id number] Date of Birth/Sex: 1946-08-09 (72 y.o. 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, initial encounter T81.31XD Disruption of external operation (surgical) wound, not elsewhere classified, subsequent encounter Facility Procedures CPT4 Code: 08657846 Description: 11042 - DEB SUBQ TISSUE 20 SQ CM/< ICD-10 Diagnosis Description L97.312 Non-pressure chronic ulcer of right ankle with fat layer expo Modifier: sed Quantity: 1 CPT4 Code: 96295284 Description: 11045 - DEB SUBQ TISS EA ADDL 20CM ICD-10 Diagnosis Description L97.312 Non-pressure chronic ulcer of right ankle with fat layer expo Modifier: sed Quantity: 1 Physician Procedures CPT4 Code: 1324401 Description: 11042 - WC PHYS SUBQ TISS 20 SQ CM ICD-10 Diagnosis Description L97.312 Non-pressure chronic ulcer of right ankle with fat layer expos Modifier: ed Quantity: 1 CPT4 Code: [id number] Description: 11045 - WC PHYS SUBQ TISS EA ADDL 20 CM ICD-10 Diagnosis Description L97.312 Non-pressure chronic ulcer of right ankle with fat layer expos Modifier: ed Quantity: 1 Electronic Signature(s) Signed: 03/25/2018 9:24:47 PM By: Lenda Kelp PA-C Entered By: Lenda Kelp on 03/25/2018 08:40:02

## 2018-04-20 NOTE — Progress Notes (Signed)
LAIKEN, NOHR ([id number]) Visit Report for 04/08/2018 Arrival Information Details Patient Name: [name], [name]. Date of Service: 04/08/2018 8:30 AM Medical Record Number: [id number] Patient Account Number: [id number] Date of Birth/Sex: 1946/02/21 (72 y.o. M) Treating RN: Renne Crigler Primary Care Raevon Broom: [name] Other Clinician: Referring Habeeb Puertas: [name] Treating Rhoderick Farrel/Extender: Kathreen Cosier in Treatment: 116 Visit Information History Since Last Visit All ordered tests and consults were completed: No Patient Arrived: Ambulatory Added or deleted any medications: No Arrival Time: 08:18 Any new allergies or adverse reactions: No Accompanied By: self Had a fall or experienced change in No Transfer Assistance: None activities of daily living that may affect Patient Identification Verified: Yes risk of falls: Secondary Verification Process Yes Signs or symptoms of abuse/neglect since last visito No Completed: Hospitalized since last visit: No Patient Requires Transmission-Based No Implantable device outside of the clinic excluding No Precautions: cellular tissue based products placed in the center Patient Has Alerts: Yes since last visit: Patient Alerts: Patient on Blood Pain Present Now: No Thinner Electronic Signature(s) Signed: 04/09/2018 9:21:22 AM By: Renne Crigler Entered By: Renne Crigler on 04/08/2018 08:19:36 [name] ([id number]) -------------------------------------------------------------------------------- Encounter Discharge Information Details Patient Name: [name] Date of Service: 04/08/2018 8:30 AM Medical Record Number: [id number] Patient Account Number: [id number] Date of Birth/Sex: [birth year] (72 y.o. M) Treating RN: Renne Crigler Primary Care Kailiana Granquist: [name] Other Clinician: Referring Kainan Patty: [name] Treating Kimiya Brunelle/Extender: Kathreen Cosier in Treatment:  116 Encounter Discharge Information Items Discharge Condition: Stable Ambulatory Status: Ambulatory Discharge Destination: Home Transportation: Private Auto Schedule Follow-up Appointment: Yes Clinical Summary of Care: Electronic Signature(s) Signed: 04/09/2018 9:21:22 AM By: Renne Crigler Entered By: Renne Crigler on 04/08/2018 08:45:27 [name] ([id number]) -------------------------------------------------------------------------------- Lower Extremity Assessment Details Patient Name: [name] Date of Service: 04/08/2018 8:30 AM Medical Record Number: [id number] Patient Account Number: [id number] Date of Birth/Sex: July 24, 1946 (72 y.o. M) Treating RN: Renne Crigler Primary Care Michiah Mudry: [name] Other Clinician: Referring Dayonna Selbe: [name] Treating Tanji Storrs/Extender: Kathreen Cosier in Treatment: 116 Edema Assessment Assessed: [Left: No] [Right: No] Edema: [Left: Ye] [Right: s] Calf Left: Right: Point of Measurement: 40 cm From Medial Instep cm 35 cm Ankle Left: Right: Point of Measurement: 16 cm From Medial Instep cm 22 cm Vascular Assessment Claudication: Claudication Assessment [Right:None] Pulses: Dorsalis Pedis Palpable: [Right:Yes] Posterior Tibial Extremity colors, hair growth, and conditions: Extremity Color: [Right:Normal] Hair Growth on Extremity: [Right:No] Temperature of Extremity: [Right:Warm] Capillary Refill: [Right:< 3 seconds] Toe Nail Assessment Left: Right: Thick: Yes Discolored: Yes Deformed: No Improper Length and Hygiene: No Electronic Signature(s) Signed: 04/09/2018 9:21:22 AM By: Renne Crigler Entered By: Renne Crigler on 04/08/2018 08:29:16 [name] ([id number]) -------------------------------------------------------------------------------- Multi Wound Chart Details Patient Name: [name] Date of Service: 04/08/2018 8:30 AM Medical Record Number: [id number] Patient  Account Number: [id number] Date of Birth/Sex: 05-10-1946 (72 y.o. M) Treating RN: Huel Coventry Primary Care Audianna Landgren: [name] Other Clinician: Referring Vue Pavon: [name] Treating Keats Kingry/Extender: Kathreen Cosier in Treatment: 116 Vital Signs Height(in): 76 Pulse(bpm): 87 Weight(lbs): 238 Blood Pressure(mmHg): 126/63 Body Mass Index(BMI): 29 Temperature(F): 98.1 Respiratory Rate 16 (breaths/min): Photos: [1:No Photos] [2:No Photos] [N/A:N/A] Wound Location: [1:Right Malleolus - Lateral] [2:Right Malleolus - Medial] [N/A:N/A] Wounding Event: [1:Gradually Appeared] [2:Gradually Appeared] [N/A:N/A] Primary Etiology: [1:Venous Leg Ulcer] [2:Venous Leg Ulcer] [N/A:N/A] Comorbid History: [1:Cataracts, Chronic Obstructive Cataracts, Chronic Obstructive N/A Pulmonary Disease (COPD), Pulmonary Disease (COPD), Osteoarthritis] [2:Osteoarthritis] Date Acquired: [1:08/11/2015] [2:01/30/2016] [N/A:N/A] Weeks of Treatment: [1:116] [2:114] [N/A:N/A]  Wound Status: [1:Open] [2:Open] [N/A:N/A] Clustered Wound: [1:No] [2:Yes] [N/A:N/A] Measurements L x W x D [1:6x5x0.2] [2:4x2.8x0.2] [N/A:N/A] (cm) Area (cm) : [1:23.562] [2:8.796] [N/A:N/A] Volume (cm) : [1:4.712] [2:1.759] [N/A:N/A] % Reduction in Area: [1:-36.40%] [2:23.80%] [N/A:N/A] % Reduction in Volume: [1:9.10%] [2:-52.30%] [N/A:N/A] Classification: [1:Full Thickness Without Exposed Support Structures] [2:Full Thickness Without Exposed Support Structures] [N/A:N/A] Exudate Amount: [1:Medium] [2:Medium] [N/A:N/A] Exudate Type: [1:Serosanguineous] [2:Serosanguineous] [N/A:N/A] Exudate Color: [1:red, brown] [2:red, brown] [N/A:N/A] Wound Margin: [1:Distinct, outline attached] [2:Distinct, outline attached] [N/A:N/A] Granulation Amount: [1:Small (1-33%)] [2:Small (1-33%)] [N/A:N/A] Granulation Quality: [1:Pink] [2:Red] [N/A:N/A] Necrotic Amount: [1:Large (67-100%)] [2:Large (67-100%)] [N/A:N/A] Necrotic Tissue: [1:Eschar,  Adherent Slough] [2:Adherent Slough] [N/A:N/A] Exposed Structures: [1:Fat Layer (Subcutaneous Tissue) Exposed: Yes Fascia: No Tendon: No Muscle: No Joint: No Bone: No] [2:Fat Layer (Subcutaneous Tissue) Exposed: Yes Fascia: No Tendon: No Muscle: No Joint: No Bone: No] [N/A:N/A] Epithelialization: [1:None] [2:None] [N/A:N/A] Debridement: [1:Debridement - Selective/Open Wound] [2:Debridement - Selective/Open Wound] [N/A:N/A] Pre-procedure 08:37 08:37 N/A Verification/Time Out Taken: Pain Control: Other Other N/A Level: Non-Viable Tissue Non-Viable Tissue N/A Debridement Area (sq cm): 19.8 8 N/A Instrument: Curette Curette N/A Bleeding: Minimum Minimum N/A Hemostasis Achieved: Pressure Pressure N/A Procedural Pain: 0 0 N/A Post Procedural Pain: 0 0 N/A Debridement Treatment Procedure was tolerated well Procedure was tolerated well N/A Response: Post Debridement 6x5x0.2 4x2.8x0.2 N/A Measurements L x W x D (cm) Post Debridement Volume: 4.712 1.759 N/A (cm) Periwound Skin Texture: Scarring: Yes Excoriation: No N/A Excoriation: No Induration: No Induration: No Callus: No Callus: No Crepitus: No Crepitus: No Rash: No Rash: No Scarring: No Periwound Skin Moisture: Maceration: No Maceration: No N/A Dry/Scaly: No Dry/Scaly: No Periwound Skin Color: Atrophie Blanche: No Atrophie Blanche: No N/A Cyanosis: No Cyanosis: No Ecchymosis: No Ecchymosis: No Erythema: No Erythema: No Hemosiderin Staining: No Hemosiderin Staining: No Mottled: No Mottled: No Pallor: No Pallor: No Rubor: No Rubor: No Temperature: No Abnormality No Abnormality N/A Tenderness on Palpation: Yes Yes N/A Wound Preparation: Ulcer Cleansing: Ulcer Cleansing: N/A Rinsed/Irrigated with Saline Rinsed/Irrigated with Saline Topical Anesthetic Applied: Topical Anesthetic Applied: Other: lidocaine 4% Other: LIDOCAINE 4% Procedures Performed: Debridement Debridement N/A Treatment Notes Wound #1  (Right, Lateral Malleolus) 1. Cleansed with: Clean wound with Normal Saline 2. Anesthetic Topical Lidocaine 4% cream to wound bed prior to debridement 3. Peri-wound Care: Other peri-wound care (specify in notes) 4. Dressing Applied: Dry Gauze 5. Secondary Dressing Applied Kerlix/Conform Wound #2 (Right, Medial Malleolus) [name], [name] ([id number]) 1. Cleansed with: Clean wound with Normal Saline 2. Anesthetic Topical Lidocaine 4% cream to wound bed prior to debridement 3. Peri-wound Care: Other peri-wound care (specify in notes) 4. Dressing Applied: Dry Gauze 5. Secondary Dressing Applied Kerlix/Conform Electronic Signature(s) Signed: 04/08/2018 8:47:54 AM By: Bonnell Publicoulter, Leah Entered By: Bonnell Publicoulter, Leah on 04/08/2018 08:47:54 [name] StackBLACKWELL, [name] ([id number]) -------------------------------------------------------------------------------- Multi-Disciplinary Care Plan Details Patient Name: [name] StackBLACKWELL, [name] Date of Service: 04/08/2018 8:30 AM Medical Record Number: [id number] Patient Account Number: [id number] Date of Birth/Sex: 1946-07-11 [age] y.o. M) Treating RN: Huel CoventryWoody, Kim Primary Care Sharronda Schweers: Darreld McleanMILES, LINDA Other Clinician: Referring Gusta Marksberry: Darreld McleanMILES, LINDA Treating Lis Savitt/Extender: Kathreen Cosieroulter, Leah Weeks in Treatment: 116 Active Inactive ` Venous Leg Ulcer Nursing Diagnoses: Knowledge deficit related to disease process and management Goals: Patient will maintain optimal edema control Date Initiated: 04/03/2016 Target Resolution Date: 11/24/2016 Goal Status: Active Interventions: Compression as ordered Treatment Activities: Therapeutic compression applied : 04/03/2016 Notes: ` Wound/Skin Impairment Nursing Diagnoses: Impaired tissue integrity Goals: Ulcer/skin breakdown will heal within 14 weeks Date Initiated: 01/17/2016 Target Resolution  Date: 11/24/2016 Goal Status: Active Interventions: Assess ulceration(s) every visit Notes: Electronic  Signature(s) Signed: 04/08/2018 6:04:06 PM By: Elliot Gurney, BSN, RN, CWS, Kim RN, BSN Entered By: Elliot Gurney, BSN, RN, CWS, Kim on 04/08/2018 08:34:32 [name] ([id number]) -------------------------------------------------------------------------------- Pain Assessment Details Patient Name: [name] Date of Service: 04/08/2018 8:30 AM Medical Record Number: [id number] Patient Account Number: [id number] Date of Birth/Sex: 04/29/1946 (72 y.o. M) Treating RN: Renne Crigler Primary Care Milliana Reddoch: [name] Other Clinician: Referring Dieter Hane: [name] Treating Jimmi Sidener/Extender: Kathreen Cosier in Treatment: 116 Active Problems Location of Pain Severity and Description of Pain Patient Has Paino No Site Locations Pain Management and Medication Current Pain Management: Electronic Signature(s) Signed: 04/09/2018 9:21:22 AM By: Renne Crigler Entered By: Renne Crigler on 04/08/2018 08:20:06 [name] ([id number]) -------------------------------------------------------------------------------- Patient/Caregiver Education Details Patient Name: [name] Date of Service: 04/08/2018 8:30 AM Medical Record Number: [id number] Patient Account Number: [id number] Date of Birth/Gender: 1946-05-12 (72 y.o. M) Treating RN: Renne Crigler Primary Care Physician: [name] Other Clinician: Referring Physician: [name] Treating Physician/Extender: Kathreen Cosier in Treatment: 116 Education Assessment Education Provided To: Patient Education Topics Provided Wound Debridement: Handouts: Wound Debridement Methods: Explain/Verbal Responses: State content correctly Wound/Skin Impairment: Handouts: Caring for Your Ulcer Methods: Explain/Verbal Responses: State content correctly Electronic Signature(s) Signed: 04/09/2018 9:21:22 AM By: Renne Crigler Entered By: Renne Crigler on 04/08/2018 08:45:42 [name], [name]  ([id number]) -------------------------------------------------------------------------------- Wound Assessment Details Patient Name: [name] Date of Service: 04/08/2018 8:30 AM Medical Record Number: [id number] Patient Account Number: [id number] Date of Birth/Sex: 08-31-1945 (72 y.o. M) Treating RN: Renne Crigler Primary Care Maymunah Stegemann: [name] Other Clinician: Referring Apolonio Cutting: [name] Treating Rosaire Cueto/Extender: Kathreen Cosier in Treatment: 116 Wound Status Wound Number: 1 Primary Venous Leg Ulcer Etiology: Wound Location: Right Malleolus - Lateral Wound Open Wounding Event: Gradually Appeared Status: Date Acquired: 08/11/2015 Comorbid Cataracts, Chronic Obstructive Pulmonary Weeks Of Treatment: 116 History: Disease (COPD), Osteoarthritis Clustered Wound: No Photos Photo Uploaded By: Renne Crigler on 04/08/2018 13:06:14 Wound Measurements Length: (cm) 6 Width: (cm) 5 Depth: (cm) 0.2 Area: (cm) 23.562 Volume: (cm) 4.712 % Reduction in Area: -36.4% % Reduction in Volume: 9.1% Epithelialization: None Tunneling: No Undermining: No Wound Description Full Thickness Without Exposed Support Classification: Structures Wound Margin: Distinct, outline attached Exudate Medium Amount: Exudate Type: Serosanguineous Exudate Color: red, brown Foul Odor After Cleansing: No Slough/Fibrino Yes Wound Bed Granulation Amount: Small (1-33%) Exposed Structure Granulation Quality: Pink Fascia Exposed: No Necrotic Amount: Large (67-100%) Fat Layer (Subcutaneous Tissue) Exposed: Yes Necrotic Quality: Eschar, Adherent Slough Tendon Exposed: No Muscle Exposed: No Joint Exposed: No Bone Exposed: No [name], [name] ([id number]) Periwound Skin Texture Texture Color No Abnormalities Noted: No No Abnormalities Noted: No Callus: No Atrophie Blanche: No Crepitus: No Cyanosis: No Excoriation: No Ecchymosis: No Induration: No Erythema:  No Rash: No Hemosiderin Staining: No Scarring: Yes Mottled: No Pallor: No Moisture Rubor: No No Abnormalities Noted: No Dry / Scaly: No Temperature / Pain Maceration: No Temperature: No Abnormality Tenderness on Palpation: Yes Wound Preparation Ulcer Cleansing: Rinsed/Irrigated with Saline Topical Anesthetic Applied: Other: lidocaine 4%, Treatment Notes Wound #1 (Right, Lateral Malleolus) 1. Cleansed with: Clean wound with Normal Saline 2. Anesthetic Topical Lidocaine 4% cream to wound bed prior to debridement 3. Peri-wound Care: Other peri-wound care (specify in notes) 4. Dressing Applied: Dry Gauze 5. Secondary Dressing Applied Kerlix/Conform Electronic Signature(s) Signed: 04/09/2018 9:21:22 AM By: Renne Crigler Entered By: Renne Crigler on 04/08/2018 74:25:95 [name] ([id number]) --------------------------------------------------------------------------------  Wound Assessment Details Patient Name: [name], ESKELSON. Date of Service: 04/08/2018 8:30 AM Medical Record Number: [id number] Patient Account Number: [id number] Date of Birth/Sex: January 28, 1946 (72 y.o. M) Treating RN: Renne Crigler Primary Care Crandall Harvel: [name] Other Clinician: Referring Malyk Girouard: [name] Treating Shameek Nyquist/Extender: Kathreen Cosier in Treatment: 116 Wound Status Wound Number: 2 Primary Venous Leg Ulcer Etiology: Wound Location: Right Malleolus - Medial Wound Open Wounding Event: Gradually Appeared Status: Date Acquired: 01/30/2016 Comorbid Cataracts, Chronic Obstructive Pulmonary Weeks Of Treatment: 114 History: Disease (COPD), Osteoarthritis Clustered Wound: Yes Photos Photo Uploaded By: Renne Crigler on 04/08/2018 13:06:15 Wound Measurements Length: (cm) 4 Width: (cm) 2.8 Depth: (cm) 0.2 Area: (cm) 8.796 Volume: (cm) 1.759 % Reduction in Area: 23.8% % Reduction in Volume: -52.3% Epithelialization: None Tunneling: No Undermining:  No Wound Description Full Thickness Without Exposed Support Classification: Structures Wound Margin: Distinct, outline attached Exudate Medium Amount: Exudate Type: Serosanguineous Exudate Color: red, brown Foul Odor After Cleansing: No Slough/Fibrino Yes Wound Bed Granulation Amount: Small (1-33%) Exposed Structure Granulation Quality: Red Fascia Exposed: No Necrotic Amount: Large (67-100%) Fat Layer (Subcutaneous Tissue) Exposed: Yes Necrotic Quality: Adherent Slough Tendon Exposed: No Muscle Exposed: No Joint Exposed: No Bone Exposed: No [name], [name] Periwound Skin Texture Texture Color No Abnormalities Noted: No No Abnormalities Noted: No Callus: No Atrophie Blanche: No Crepitus: No Cyanosis: No Excoriation: No Ecchymosis: No Induration: No Erythema: No Rash: No Hemosiderin Staining: No Scarring: No Mottled: No Pallor: No Moisture Rubor: No No Abnormalities Noted: No Dry / Scaly: No Temperature / Pain Maceration: No Temperature: No Abnormality Tenderness on Palpation: Yes Wound Preparation Ulcer Cleansing: Rinsed/Irrigated with Saline Topical Anesthetic Applied: Other: LIDOCAINE 4%, Treatment Notes Wound #2 (Right, Medial Malleolus) 1. Cleansed with: Clean wound with Normal Saline 2. Anesthetic Topical Lidocaine 4% cream to wound bed prior to debridement 3. Peri-wound Care: Other peri-wound care (specify in notes) 4. Dressing Applied: Dry Gauze 5. Secondary Dressing Applied Kerlix/Conform Electronic Signature(s) Signed: 04/09/2018 9:21:22 AM By: Renne Crigler Entered By: Renne Crigler on 04/08/2018 08:27:13 [name] ([id number]) -------------------------------------------------------------------------------- Vitals Details Patient Name: [name] Date of Service: 04/08/2018 8:30 AM Medical Record Number: [id number] Patient Account Number: [id number] Date of Birth/Sex: June 18, 1946 (72 y.o.  M) Treating RN: Renne Crigler Primary Care Kindell Strada: [name] Other Clinician: Referring Tareva Leske: [name] Treating Hubbard Seldon/Extender: Kathreen Cosier in Treatment: 116 Vital Signs Time Taken: 08:20 Temperature (F): 98.1 Height (in): 76 Pulse (bpm): 87 Weight (lbs): 238 Respiratory Rate (breaths/min): 16 Body Mass Index (BMI): 29 Blood Pressure (mmHg): 126/63 Reference Range: 80 - 120 mg / dl Electronic Signature(s) Signed: 04/09/2018 9:21:22 AM By: Renne Crigler Entered By: Renne Crigler on 04/08/2018 08:20:45

## 2018-04-20 NOTE — Progress Notes (Signed)
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 He denies any complaints of pain or intolerance to compression. Both ulcers are smaller in size oriented today's measurements, both are covered with a softly adherent slough. 07/24/16; medial wound is smaller, lateral about the same although surface looks better. Still using Hydrofera Blue 07/31/16; arrives today complaining of  pain in the lateral part of his foot. Nurse reports a lot more drainage. He has been using Hydrofera Blue. Switch to silver alginate today 08/03/2016 -- I was asked to see the patient was here for a nurse visit today. I understand he had a  lot of pain in his right lower extremity and was having blisters on his right foot which have not been there before. Though he started on doxycycline he does not have blisters elsewhere on his body. I do not believe this is a drug allergy. also mentioned that there was a copious purulent discharge from the wound and clinically there is no evidence of cellulitis. 08/07/16; I note that the patient came in for his nurse check on Friday apparently with blisters on his toes on the right than a lot of swelling in his forefoot. He continued on the doxycycline that I had prescribed on 12/8. A culture was done of the lateral wound that showed a combination of a few Proteus and Pseudomonas. Doxycycline might of covered the Proteus but would be unlikely to cover the Pseudomonas. He is on Coumadin. He arrives in the clinic today feeling a lot better states the pain is a lot better but nothing specific really was done other than to rewrap the foot also noted that he had arterial studies ordered in August although these were never done. It is reasonable to go ahead and reorder these. 08/14/16; generally arrives in a better state today in terms of the wounds he has taken cefdinir for one week. Our intake nurse reports copious amounts of drainage but the patient is complaining of much less pain. He is not had his PT and INR checked and I've asked him to do this today or tomorrow. 08/24/2016 -- patient arrives today after 10 days and said he had a stomach upset. His arterial study was done and I have reviewed this report and find it to be within normal limits. However I did not note any venous duplex studies for reflux, and Dr. Leanord Hawking may have ordered these in the past but I will leave it to him to decide if he needs these. The patient has finished his course of cefdinir. 08/28/16; patient arrives today again with copious amounts of thick really green drainage for our intake nurse. He states he has a very tender  spot at the superior part of the lateral wound. Wounds are larger 09/04/16; no real change in the condition of this patient's wound still copious amounts of surface slough. Started him on Iodoflex last week he is completing another course of Cefdinir or which I think was done empirically. His arterial study showed ABIs were 1.1 on the right 1.5 on the left. He did have a slightly reduced ABI in the right the left one was not obtained. Had calcification of the right posterior tibial artery. The interpretation was no segmental stenosis. His waveforms were triphasic. [place], ICE ([id number]) His reflux studies are later this month. Depending on this I'll send him for a vascular consultation, he may need to see plastic surgery as I believe he is had plastic surgery on this foot in the past. He had an injury to the foot in the 1980s. 1/16 /18 right lateral greater than right medial ankle wounds on the right in the setting of previous skin grafting. 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 He does not have arterial issues.  Each week he comes in with the same adherent surface slough although there was less of this today 09/18/16; right lateral greater than right medial lower extremity wounds in the setting of previous skin grafting and trauma. He has least to vein laser ablation scheduled for February 2 for venous reflux. He does not have significant arterial disease. Problem has been very difficult to handle surface slough/necrotic tissue. Recently using Iodoflex for this with some, albeit slow improvement 09/25/16; right lateral greater than right medial lower extremity venous wounds in the setting of previous skin grafting. He is going for ablation surgery on February 2 after this he'll come back here for rewrap. He has been using Iodoflex as the primary dressing. 10/02/16; right lateral greater than right medial  lower extremity wounds in the setting of previous skin grafting. He had his ablation surgery last week, I don't have a report. He tolerated this well. Came in with a thigh-high Unna boots on Friday. We have been using Iodoflex as the primary dressing. His measurements are improving 10/09/16; continues to make nice aggressive in terms of the wounds on his lateral and medial right ankle in the setting of previous skin grafting. Yesterday he noticed drainage at one of his surgical sites from his venous ablation on the right calf. He took off the bandage over this area felt a "popping" sensation and a reddish-brown drainage. He is not complaining of any pain 10/16/16; he continues to make nice progression in terms of the wounds on the lateral and medial malleolus. Both smaller using Iodoflex. He had a surgical area in his posterior mid calf we have been using iodoform. All the wounds are down and dimensions 10/23/16; the patient arrives today with no complaints. He states the Iodoflex is a bit uncomfortable. He is not systemically unwell. We have been using Iodoflex to the lateral right ankle and the medial and Aquacel Ag to the reflux surgical wound on the posterior right calf. All of these wounds are doing well 10/30/16; patient states he has no pain no systemic symptoms. I changed him to [facility] last week. Although the wounds are doing well 11/06/16; patient reports no pain or systemic symptoms. We continue with Hydrofera Blue. Both wound areas on the medial and lateral ankle appear to be doing well with improvement and dimensions and improvement in the wound bed. 11/13/16; patient's dimensions continued to improve. We continue with Hydrofera Blue on the medial and lateral side. Appear to be doing well with healthy granulation and advancing epithelialization 11/20/16; patient's dimensions improving laterally by about half a centimeter in length. Otherwise no change on the medial side. Using  [facility] 12/04/16; no major change in patient's wound dimensions. Intake nurse reports more drainage. 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 He is voicing no complaints or concerns. He has been tolerating his twice weekly compression therapy changes 12/11/16 Patient complains of pain and increased drainage.. wants hydrofera blue 12/18/16 improvement. Sorbact 12/25/16; medial wound is smaller, lateral measures the same. Still on sorbact 01/01/17; medial wound continues to be smaller, lateral measures about the same however there is clearly advancing epithelialization here as well in fact I think the wound will ultimately divided into 2 open areas 01/08/17; unfortunately today fairly significant regression in several areas. Surface of the lateral wound covered again in adherent necrotic material which is difficult to debridement. He has significant surrounding skin maceration. The expanding  area of tissue epithelialization in the middle of the wound that was encouraging last week appears to be smaller. There is no surrounding tenderness. The area on the medial leg also did not seem to be as healthy as last week, the reason for this regression this week is not totally clear. We have been using Sorbact for the last 4 weeks. We'll switched of polymen AG which we will order via home medical supply. If there is a problem with this would switch back to Iodoflex 01/15/18; drainage,odor. No change. Switched to polymen last week 01/22/17; still continuous drainage. Culture I did last week showed a few Proteus pansensitive. I did this culture because of drainage. Put him on Augmentin which she has been taking since Saturday however he is developed 4-5 liquid bowel movements. He is also on Coumadin. Beyond this wound is not changed at all, still nonviable necrotic surface material which I debrided  reveals healthy granulation line 01/29/17; still copious amounts of drainage reported by her intake nurse. Wound measuring slightly smaller. Currently fact on Iodoflex although I'm looking forward to changing back to perhaps [facility] or polymen AG 02/05/17; still large amounts of drainage and presenting with really large amounts of adherent slough and necrotic material over the remaining open area of the wound. We have been using Iodoflex but with little improvement in the surface. Change to [facility] 02/12/17; still large amount of drainage. Much less adherent slough however. Started [employer]	[place] last week YOSMAR, RYKER ([id number]) 02/19/17; drainage is better this week. Much less adherent slough. Perhaps some improvement in dimensions. Using [facility] 02/26/17; severe venous insufficiency wounds on the right lateral and right medial leg. Drainage is some better and slough is less adherent we've been using Hydrofera Blue 03/05/17 on evaluation today patient appears to be doing well. His wounds have been decreasing in size and overall he is pleased with how this is progressing. We are awaiting approval for the epigraph which has previously been recommended in the meantime the [facility] Dressing is to be doing very well for him. 03/12/17; wound dimensions are smaller still using Hydrofera Blue. Comes in on Fridays for a dressing change. 03/19/17; wound dimensions continued to contract. Healing of this wound is complicated by continuous significant drainage as well as recurrent buildup of necrotic surface material. We looked into Apligraf and he has a $290 co-pay per application but truthfully I think the drainage as well as the nonviable surface would preclude use of Apligraf there are any other skin substitute at this point therefore the continued plan will be debridement each clinic visit, 2 times a week dressing changes and continued use of Hydrofera Blue. improvement has  been very slow but sustained 03/26/17 perhaps slight improvements in peripheral epithelialization is especially inferiorly. Still with large amount of drainage and tightly adherent necrotic surface on arrival. Along with the intake nurse I reviewed previous treatment. He worsened on Iodoflex and had a 4 week trial of sorbact, Polymen AG and a long courses of Aquacel Ag. He is not a candidate for advanced treatment options for many reasons 04/09/17 on evaluation today patient's right lower extremity wounds appear to be doing a little better. Fortunately he has no significant discomfort and has been tolerating the dressing changes including the wraps without complication. With that being said he really does not have swelling anymore compared to what he has had in the past following his vascular intervention. He wonders if potentially we could attempt avoiding the rats to  see if he could cleanse the wound in between to try to prevent some of the fiber and its buildup that occurs in the interim between when we see him week to week. No fevers, chills, nausea, or vomiting noted at this time. 04/16/17; noted that the staff made a choice last week not to put him in compression. The patient is changing his dressing at home using [facility] and changing every second day. His wife is washing the wound with saline. He is using kerlix. Surprisingly he has not developed a lot of edema. This choice was made because of the degree of fluid retention and maceration even with changing his dressing twice a week 04/23/17; absolutely no change. Using Hydrofera Blue. Recurrent tightly adherent nonviable surface material. This is been refractory to Iodoflex, sorbact and now Hydrofera Blue. More recently he has been changing his own dressings at home, cleansing the wound in the shower. He has not developed lower extremity edema 04/30/17; if anything the wound is larger still in adherent surface although it debrided easily  today. I've been using Mehta honey for 1 week and I'd like to try and do it for a second week this see if we can get some form of viable surface here. 05/07/17; patient arrives with copious amounts of drainage and some pain in the superior part of the wound. He has not been systemically unwell. He's been changing this daily at home 05/14/17; patient arrives today complaining of less drainage and less pain. Dimensions slightly better. Surface culture I did of this last week grew MSSA I put him on dicloxacillin for 10 days. Patient requesting a further prescription since he feels so much better. He did not obtain the [facility] AG for reasons that aren't clear, he has been using Hydrofera Blue 05/21/17; perhaps some less drainage and less pain. He is completing another week of doxycycline. Unfortunately there is no change in the wound measurements are appearance still tightly adherent necrotic surface material that is really defied treatment. We have been using Hydrofera Blue most recently. He did not manage to get Anasept. He was told it was prescription at [facility] 05/29/17; absolutely no improvement in these wound areas. He had remote plastic surgery with who was done at [facility] in [place] surrounding this area. This was a traumatic wound with extensive plastic surgery/skin grafting at that time. I switched him to sorbact last week to see if he can do anything about the recurrent necrotic surface and drainage. This is largely been refractory to Iodoflex/Hydrofera Blue/alginates. I believe we tried Elby Showers and more recently Medi honey l however the drainage is really too excessive 06/04/17; patient went to see Dr. Ulice Bold. Per the patient she ordered him a compression stocking. Continued the Anasept gel and sorbact was already on. No major improvement in the condition of the wounds in fact the medial wound looks larger. Again per the patient she did not feel a skin graft or operative  debridement was indicated The patient had previous venous ablation in February 2018. Last arterial studies were in December 2017 and were within normal limits 06/18/17; patient arrives back in clinic today using Anasept gel with sorbact. He changes this every day is wearing his own compression stocking. The patient actually saw Dr. Leta Baptist of plastic surgery. I've reviewed her note. The appointment was on 05/30/17. The basic issue with here was that she did not feel that skin grafts for patients with underlying stasis and edema have a good long-term success rate. She also  discuss skin substitutes which has been done here as well however I have not been able to get the surface of these wounds to something that I think would support skin substitutes. This is why he felt he might need an DISHON, KEHOE. ([id number]) operative debridement more aggressively than I can do in this clinic Review of systems; is otherwise negative he feels well 07/02/17; patient has been using Anasept gel with Sorbact. He changes this every day scrubs out the wound beds. Arrives today with the wound bed looking some better. Easier debridement 07/16/17; patient has been using Anasept gel was sorbact for about a month now. The necrotic service on his wound bed is better and the drainage is now down to minimal but we've not made any improvements in the epithelialization. Change to Santyl today. Surface of the wound is still not good enough to support skin substitutes 07/30/17 on evaluation today patient appears to be doing very well in regard to his right bimalleolus wounds. He has been tolerating the treatment with the Anasept gel and sorbact. However we were going to try and switch to Santyl to be more aggressive with these wounds. This was however cost prohibitive. Therefore we will likely go back to the sorbact. Nonetheless he is not having any significant discomfort he still is forming slough over the wound location  but nothing too significant. The wounds do appear to be filling in nicely. 08/13/17; I have not seen this wound in about a month. There is not much change. The fact the area medially looks larger. He has been using sorbact and Anasept for over a month now without much effect. Arrives in clinic stating that he does not want the area debrided 08/28/17; patient arrives with the areas on his right medial and right lateral ankle worse. The wounds of expanded there is erythema and still drainage. There is no pain and no tenderness. We had been using Keracel AG clearly not doing well. 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 He states that even the evening that we began utilizing the Iodoflex he started having burning. Subsequently this never really improved and he eventually discontinue the use of the Iodoflex altogether. Unfortunately he did not let us know about any of this until today. I'm unsure of any specific infection at this point although I am going to obtain a wound culture today in order to see if there's anything that could potentially be causing an infection as well. It does sound as if he's had the Iodoflex before and did not have this kind of reaction although this does sound very specific for a reaction to the Iodoflex. 09/17/17 on evaluation today patient appears to be doing significantly better compared to last week's evaluation. At that time it actually appears that he did have an infection the good news is that we have been able to get this under control with switching to the [facility] solution he's not having as much pain and discomfort he still does have some erythema surrounding the medial aspect wound. He has been  tolerating the Dakin's soaked gauze dressing which is excellent and that his wounds do not seem to have as much adherent slough. Overall I'm pleased with the progress she's  made in last week. 11/05/17 on evaluation today patient appears to be doing better in regard to his right medial and lateral malleolus ulcers. He has been tolerating the dressing changes without complication. I do flight the Freada Bergeron is helping with additional granulation at this point in the wound beds do appear to be a little bit better. With that being said patient does not appear to have any evidence of significant infection at this point there is no erythema as he has previously noted he does note that the 30 day course of antibiotics I placed him on will end tomorrow. 11/12/17 on evaluation today patient actually appears to be doing excellent in regard to his right medial and lateral malleolus ulcers. He has been tolerating the dressing changes without complication. Fortunately he has no evidence of infection at this time and overall I believe he is progressing extremely nicely he has a lot of new epithelialization noted on evaluation today. Patient likewise is very pleased with the progress even just since last week. 11/19/17 on evaluation today patient appears to be doing about the same in regard to his ulcerations. He does not have any additional breakdown although he really has not noted any significant improvement either over the past week. With that being said the more concerning thing at this time is that he is beginning to show signs of erythema surrounding both wounds which is what typically happens and then he will develop into a more overt infection. This has been the course for some time. I hope that doing the 30 day in a biotic cycle this past time would break that so that he would not have the same type of thing happen again. Nonetheless it appears that is digressing back into this infected state unfortunately. With  that being said he is not having any pain currently which is good he typically does start to hurt more as this progresses. Fortunately there does not appear to be any evidence of infection systemically which is good news. 11/26/17 on evaluation today patient appears to actually be doing rather well in regard to his ulcer compared to last week. He still has your theme and noted around both the medial and lateral malleolus ulcers of the right lower extremity. He does not seem to be quite as overtly infected however. He has been tolerating the dressing changes without complication which is good news. The gentamicin cream does seem to have been of benefit for him. With that being said he does actually have an appointment with Dr. Sampson Goon this morning to see if there's anything that would be recommended in the way of IV antibiotic Wilensky, Adley C. ([id number]) therapy. Otherwise he still has slough noted on the surface of the wound. 12/03/17 on evaluation today patient presents for follow-up concerning his ongoing right lower extremity ulcers. He has been tolerating the dressing changes without complication he states he has not really been using gentamicin on the lateral ankle and this seems to be doing very well. For that reason I think that is probably fine to put a hold on the gentamicin he states his burns and stings when he applies it and we maybe be able to just use this intermittently when things become more irritated. Other than that the good news is I did review his MRI which was performed on 11/28/17 and revealed that he has a negative MRI for abscess, osteomyelitis, or septic joint. Obviously these were the issues that I was concerned with the possibility of and I'm pleased  to find that there is no problems in that regard. I did also review Dr. Jarrett Ables note from infectious disease he discussed performed some lab work which apparently was done and then subsequently was going to consider  the possibility of Keflex long-term for prevention of infection although this has not been prescribed as of yet. 12/09/17 on evaluation today patient appears to be doing a little bit worse in regard to his right medial and lateral malleolus ulcers. Fortunately the wound does not seem to be significantly worse although he does have a lot more drainage especially the lateral aspect he is having a lot more pain than what he has noted more recently. Fortunately there does not appear to be any evidence of systemic infection which is good news. Again he has seen Dr. Sampson Goon but he has not been placed on the potential Keflex which was recommended as a possibility at least yet. 12/17/17 unfortunately on evaluation today the patient's wound appears to be doing much worse. He has been performing the dressing changes as directed. Upon a review of notes in epic it does appear that Dr. Jarrett Ables office specifically sending Keflex for the patient on the 16th which was last Tuesday. With that being said the patient states that though this [facility] that [facility] stated [facility] never [facility] it and subsequently the patient never took anything. He tells me he has been in bed since Friday due to the increased pain and overall general malaise. No fevers, chills, nausea, or vomiting noted at this time. 02/25/18 on evaluation today patient appears to be doing rather well in regard to his bilateral right malleolus ulcers. He has been tolerating the dressing changes without complication. Currently there does not appear to be any evidence of infection. Overall I'm very pleased with the progress that seems to been made up to this point. 03/11/18 on evaluation today patient actually appears to be doing very well in regard to his right ankle ulcers. He's been tolerating the dressing changes without complication. Fortunately there does not appear to be any evidence of sniffing infection I do believe that the  gentamicin cream continues to be of benefit for him. Fortunately he is showing signs of healing in which time I see him. He also tells me he's having no pain. 03/25/18 on evaluation today patient actually appears to show signs of improvement especially in regard to the right lateral ankle ulcer. He has been tolerating the dressing changes without complication. In general I'm very pleased with the progress that has been made up to this point. 04/08/18- He presents in follow up evaluation for right bimalleolar wounds; there is essentially no change. He admits to removing the dressing at night to allow the wounds to be "air out". We will continue with same treatment plan and he will follow up in two weeks Objective Constitutional Vitals Time Taken: 8:20 AM, Height: 76 in, Weight: 238 lbs, BMI: 29, Temperature: 98.1 F, Pulse: 87 bpm, Respiratory Rate: 16 breaths/min, Blood Pressure: 126/63 mmHg. Integumentary (Hair, Skin) Wound #1 status is Open. Original cause of wound was Gradually Appeared. The wound is located on the Right,Lateral Roslyn, Abednego C. ([id number]) Malleolus. The wound measures 6cm length x 5cm width x 0.2cm depth; 23.562cm^2 area and 4.712cm^3 volume. There is Fat Layer (Subcutaneous Tissue) Exposed exposed. There is no tunneling or undermining noted. There is a medium amount of serosanguineous drainage noted. The wound margin is distinct with the outline attached to the wound base. There is small (1-33%) pink  granulation within the wound bed. There is a large (67-100%) amount of necrotic tissue within the wound bed including Eschar and Adherent Slough. The periwound skin appearance exhibited: Scarring. The periwound skin appearance did not exhibit: Callus, Crepitus, Excoriation, Induration, Rash, Dry/Scaly, Maceration, Atrophie Blanche, Cyanosis, Ecchymosis, Hemosiderin Staining, Mottled, Pallor, Rubor, Erythema. Periwound temperature was noted as No Abnormality. The periwound  has tenderness on palpation. Wound #2 status is Open. Original cause of wound was Gradually Appeared. The wound is located on the Right,Medial Malleolus. The wound measures 4cm length x 2.8cm width x 0.2cm depth; 8.796cm^2 area and 1.759cm^3 volume. There is Fat Layer (Subcutaneous Tissue) Exposed exposed. There is no tunneling or undermining noted. There is a medium amount of serosanguineous drainage noted. The wound margin is distinct with the outline attached to the wound base. There is small (1-33%) red granulation within the wound bed. There is a large (67-100%) amount of necrotic tissue within the wound bed including Adherent Slough. The periwound skin appearance did not exhibit: Callus, Crepitus, Excoriation, Induration, Rash, Scarring, Dry/Scaly, Maceration, Atrophie Blanche, Cyanosis, Ecchymosis, Hemosiderin Staining, Mottled, Pallor, Rubor, Erythema. Periwound temperature was noted as No Abnormality. The periwound has tenderness on palpation. 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 There was a Selective/Open Wound Non-Viable Tissue Debridement with a total area of 19.8 sq cm performed by Bonnell Public, NP. With the following instrument(s): Curette to remove Non-Viable tissue/material. Material removed includes Fibrin/Exudate after achieving pain control using Other (lidocaine 4%). No specimens were taken. A time out was conducted at 08:37, prior to the start of the procedure. A Minimum amount  of bleeding was controlled with Pressure. The procedure was tolerated well with a pain level of 0 throughout and a pain level of 0 following the procedure. Patient s Level of Consciousness post procedure was recorded as Awake and Alert. Post Debridement Measurements: 6cm length x 5cm width x 0.2cm depth; 4.712cm^3 volume. Character of Wound/Ulcer Post Debridement is stable. Severity of Tissue Post Debridement is: Fat layer exposed. 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 There was a Selective/Open Wound Non-Viable Tissue Debridement with a total area of 8 sq cm performed by Bonnell Public, NP. With the following instrument(s): Curette to remove Non-Viable tissue/material. Material removed includes Fibrin/Exudate after achieving pain control using Other (lidocaine 4%). No specimens were taken. A time out was conducted at 08:37, prior to the start of the procedure. A Minimum amount of bleeding was controlled with Pressure. The procedure was tolerated well with a pain level of 0 throughout and a pain level of 0 following the procedure. NACHUM, DEROSSETT ([id number]) Patient s Level of Consciousness post procedure was recorded as Awake and Alert. Post Debridement Measurements: 4cm length x 2.8cm width x 0.2cm depth; 1.759cm^3 volume. Character of Wound/Ulcer Post Debridement is stable. Severity of Tissue Post Debridement is: Fat layer exposed. Post procedure Diagnosis Wound #2: Same as Pre-Procedure Plan Wound Cleansing: Wound #1 Right,Lateral Malleolus: Clean wound with Normal Saline.  Cleanse wound with mild soap and water May Shower, gently pat wound dry prior to applying new dressing. Wound #2 Right,Medial Malleolus: Clean wound with Normal Saline. Cleanse wound with mild soap and water May Shower, gently pat  wound dry prior to applying new dressing. Anesthetic (add to Medication List): Wound #1 Right,Lateral Malleolus: Topical Lidocaine 4% cream applied to wound bed prior to debridement (In Clinic Only). Wound #2 Right,Medial Malleolus: Topical Lidocaine 4% cream applied to wound bed prior to debridement (In Clinic Only). Skin Barriers/Peri-Wound Care: Wound #1 Right,Lateral Malleolus: Barrier cream Wound #2 Right,Medial Malleolus: Barrier cream Primary Wound Dressing: Wound #1 Right,Lateral Malleolus: Gentamicin Sulfate Cream - only on wound bed Silver Collagen Wound #2 Right,Medial Malleolus: Gentamicin Sulfate Cream - only on wound bed Silver Collagen Secondary Dressing: Wound #1 Right,Lateral Malleolus: Dry Gauze Conform/Kerlix Wound #2 Right,Medial Malleolus: Dry Gauze Conform/Kerlix Dressing Change Frequency: Wound #1 Right,Lateral Malleolus: Change dressing every other day. Wound #2 Right,Medial Malleolus: Change dressing every other day. Follow-up Appointments: Wound #1 Right,Lateral Malleolus: Return Appointment in 2 weeks. Wound #2 Right,Medial Malleolus: Return Appointment in 2 weeks. Edema Control: Wound #1 Right,Lateral Malleolus: Elevate legs to the level of the heart and pump ankles as often as possible Wound #2 Right,Medial Malleolus: Auvil, Rajvir C. ([id number]) Elevate legs to the level of the heart and pump ankles as often as possible Additional Orders / Instructions: Wound #1 Right,Lateral Malleolus: Increase protein intake. Wound #2 Right,Medial Malleolus: Increase protein intake. Electronic Signature(s) Signed: 04/08/2018 8:50:31 AM By: Bonnell Public Entered By: Bonnell Public on 04/08/2018 08:50:31 [name] ([id number]) -------------------------------------------------------------------------------- SuperBill Details Patient Name: [name] Date of Service: 04/08/2018 Medical Record Number: [id number] Patient Account  Number: [id number] Date of Birth/Sex: 1946/01/19 (72 y.o. 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, sequela T81.31XS Disruption of external operation (surgical) wound, not elsewhere classified, sequela Facility Procedures CPT4: Description Modifier Quantity Code [id number] - DEBRIDE WOUND 1ST 20 SQ CM OR < 1 ICD-10 Diagnosis Description I87.331 Chronic venous hypertension (idiopathic) with ulcer and inflammation of right lower extremity L97.312 Non-pressure chronic  ulcer of right ankle with fat layer exposed CPT4: [id number] 97598 - DEBRIDE WOUND EA ADDL 20 SQ CM 1 ICD-10 Diagnosis Description I87.331 Chronic venous hypertension (idiopathic) with ulcer and inflammation of right lower extremity L97.312 Non-pressure chronic ulcer of right ankle with fat layer  exposed Physician Procedures CPT4: Description Modifier Quantity Code [id number] 97597 - WC PHYS DEBR WO ANESTH 20 SQ CM 1 ICD-10 Diagnosis Description I87.331 Chronic venous hypertension (idiopathic) with ulcer and inflammation of right lower extremity L97.312 Non-pressure chronic  ulcer of right ankle with fat layer exposed CPT4: [id number] 97598 - WC PHYS DEBR WO ANESTH EA ADD 20 CM 1 ICD-10 Diagnosis Description I87.331 Chronic venous hypertension (idiopathic) with ulcer and inflammation of right lower extremity L97.312 Non-pressure chronic ulcer of right ankle with fat  layer exposed Electronic Signature(s) Signed: 04/08/2018 8:51:00 AM By: [name] Cline, [name]  ([id number]) Entered By: Bonnell Public on 04/08/2018 08:51:00

## 2018-04-22 ENCOUNTER — Encounter: Payer: Medicare HMO | Admitting: Physician Assistant

## 2018-04-22 DIAGNOSIS — I87311 Chronic venous hypertension (idiopathic) with ulcer of right lower extremity: Secondary | ICD-10-CM | POA: Diagnosis not present

## 2018-05-06 ENCOUNTER — Encounter: Payer: Medicare HMO | Attending: Physician Assistant | Admitting: Physician Assistant

## 2018-05-06 DIAGNOSIS — T85613S Breakdown (mechanical) of artificial skin graft and decellularized allodermis, sequela: Secondary | ICD-10-CM | POA: Insufficient documentation

## 2018-05-06 DIAGNOSIS — M1990 Unspecified osteoarthritis, unspecified site: Secondary | ICD-10-CM | POA: Diagnosis not present

## 2018-05-06 DIAGNOSIS — Z86718 Personal history of other venous thrombosis and embolism: Secondary | ICD-10-CM | POA: Insufficient documentation

## 2018-05-06 DIAGNOSIS — Z95828 Presence of other vascular implants and grafts: Secondary | ICD-10-CM | POA: Insufficient documentation

## 2018-05-06 DIAGNOSIS — L97312 Non-pressure chronic ulcer of right ankle with fat layer exposed: Secondary | ICD-10-CM | POA: Insufficient documentation

## 2018-05-06 DIAGNOSIS — Z87891 Personal history of nicotine dependence: Secondary | ICD-10-CM | POA: Diagnosis not present

## 2018-05-06 DIAGNOSIS — T8131XS Disruption of external operation (surgical) wound, not elsewhere classified, sequela: Secondary | ICD-10-CM | POA: Diagnosis not present

## 2018-05-06 DIAGNOSIS — J449 Chronic obstructive pulmonary disease, unspecified: Secondary | ICD-10-CM | POA: Diagnosis not present

## 2018-05-07 NOTE — Progress Notes (Signed)
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 ankle/malleolus both look a lot better today. Less adherent slough healthier tissue. Dimensions better especially medially 05/01/16; the patient's wound surface continues to improve however he continues to require debridement switch her easier each week. Continue Santyl/Metahydrin  mixture Hydrofera Blue next week. Still drainage on the medial aspect according to the intake nurse 05/08/16; still using Santyl and Medihoney. Still a lot of drainage per her  intake nurse. Patient has no complaints pain fever chills etc. 05/15/16 switched the Hydrofera Blue last week. Dimensions down especially in the medial right leg wound. Area on the lateral which is more substantial also looks better still requires debridement 05/22/16; we have been using Hydrofera Blue. 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 Currently he tells me that the pain is doing very well about a 1 out of 10 today. Yesterday was a little bit worse but he tells me that he was more active watering his flowers that day. Overall he feels that his symptoms are doing significantly better at this point in time. His edema continues to be controlled well with the 4-layer compression wrap and he really has not noted any odor at this point in time. He is tolerating the dressing changes when they are performed well. 06/05/16 at this point in time today patient currently shows no interval signs or symptoms of local or systemic infection. Again his pain level he rates to be a 1 out of 10 at most and overall he tells me that generally this is not giving him much trouble. In fact he even feels maybe a little bit better than last week. We have continue with the 4-layer compression wrap in which she tolerates very well at this point. He is continuing to utilize the [place]. 06/12/16 I think there has been some progression in the status of both of these wounds over today again covered in a gelatinous surface. Has been using Hydrofera Blue. We had used Iodoflex in the past I'm not sure if there was an issue other than changing to something that might progress towards closure faster 06/19/16; he did not tolerate the Flexeril last week secondary to pain and this was changed on Friday back to  [facility] area he continues to have copious amounts of gelatinous surface slough which is think inhibiting the speed of healing this area 06/26/16 patient over the last week has utilized the Santyl to try to loosen up some of the tightly adherent slough that was noted on evaluation last week. The good news is he tells me that the medial malleoli region really does not bother him the right llateral malleoli region is more tender to palpation at this point in time especially in the central/inferior location. However it does appear that the Santyl has done his job to loosen up the adherent slough at this point in time. Fortunately he has no interval signs or symptoms of infection locally or systemically no purulent discharge noted. 07/03/16 at this point in time today patient's wounds appear to be significantly improved over the right medial and lateral malleolus locations. He has much less tenderness at this point in time and the wounds appear clean her although there is still adherent slough this is sufficiently improved over what I saw last week. I still see no evidence of local infection. 07/10/16; continued gradual improvement in the right medial and lateral malleolus locations. The lateral is more substantial wound now divided into 2 by a rim of normal epithelialization. Both areas have adherent surface slough and nonviable subcutaneous tissue 07-17-16- He continues to have progress  to his right medial and lateral malleolus ulcers. He denies any complaints of pain or intolerance to compression. Both ulcers are smaller in size oriented today's measurements, both are covered with a softly adherent slough. 07/24/16; medial wound is smaller, lateral about the same although surface looks better. Still using Hydrofera Blue 07/31/16; arrives today complaining of pain in the lateral part of his foot. Nurse reports a lot more drainage. He has been using Hydrofera Blue. Switch to silver alginate  today 08/03/2016 -- I was asked to see the patient was here for a nurse visit today. I understand he had a lot of pain in his right lower extremity and was having blisters on his right foot which have not been there before. Though he started on doxycycline he does not have blisters elsewhere on his body. I do not believe this is a drug allergy. also mentioned that there was a copious purulent discharge from the wound and clinically there is no evidence of cellulitis. 08/07/16; I note that the patient came in for his nurse check on Friday apparently with blisters on his toes on the right than a lot of swelling in his forefoot. He continued on the doxycycline that I had prescribed on 12/8. A culture was done of the lateral wound that showed a combination of a few Proteus and Pseudomonas. Doxycycline might of covered the Proteus but would be unlikely to cover the Pseudomonas. He is on Coumadin. He arrives in the clinic today feeling a lot better states the pain is a lot better but nothing specific really was done other than to rewrap the foot also noted that he had arterial studies ordered in August although these were never done. It is reasonable to go ahead and reorder these. 08/14/16; generally arrives in a better state today in terms of the wounds he has taken cefdinir for one week. Our intake nurse reports copious amounts of drainage but the patient is complaining of much less pain. He is not had his PT and INR checked and I've asked him to do this today or tomorrow. 08/24/2016 -- patient arrives today after 10 days and said he had a stomach upset. His arterial study was done and I have reviewed this report and find it to be within normal limits. However I did not note any venous duplex studies for reflux, and Dr. Leanord Hawking may have ordered these in the past but I will leave it to him to decide if he needs these. The patient has finished his course of cefdinir. 08/28/16; patient arrives today again  with copious amounts of thick really green drainage for our intake nurse. He states he has a very tender spot at the superior part of the lateral wound. Wounds are larger 09/04/16; no real change in the condition of this patient's wound still copious amounts of surface slough. Started him on Iodoflex last week he is completing another course of Cefdinir or which I think was done empirically. His arterial study showed ABIs were 1.1 on the right 1.5 on the left. He did have a slightly reduced ABI in the right the left one was not obtained. Had calcification of the right posterior tibial artery. The interpretation was no segmental stenosis. His waveforms were triphasic. [name], [name] ([id number]) His reflux studies are later this month. Depending on this I'll send him for a vascular consultation, he may need to see plastic surgery as I believe he is had plastic surgery on this foot in the past. He had an injury  to the foot in the 1980s. 1/16 /18 right lateral greater than right medial ankle wounds on the right in the setting of previous skin grafting. 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 He does not have arterial issues. Each week he comes in with the same adherent surface slough although there was less of this today 09/18/16; right lateral greater than right medial lower extremity wounds in the setting of previous skin grafting and trauma. He has least to vein laser ablation scheduled for February 2 for venous reflux. He does not have significant arterial disease. Problem has been very difficult to handle surface slough/necrotic tissue. Recently using Iodoflex for this with some, albeit slow improvement 09/25/16; right lateral greater than right medial lower extremity venous wounds in the setting of previous skin grafting. He is going for ablation surgery on February 2 after this he'll come back here for rewrap.  He has been using Iodoflex as the primary dressing. 10/02/16; right lateral greater than right medial lower extremity wounds in the setting of previous skin grafting. He had his ablation surgery last week, I don't have a report. He tolerated this well. Came in with a thigh-high Unna boots on Friday. We have been using Iodoflex as the primary dressing. His measurements are improving 10/09/16; continues to make nice aggressive in terms of the wounds on his lateral and medial right ankle in the setting of previous skin grafting. Yesterday he noticed drainage at one of his surgical [name] from his venous ablation on the right calf. He took off the bandage over this area felt a "popping" sensation and a reddish-brown drainage. He is not complaining of any pain 10/16/16; he continues to make nice progression in terms of the wounds on the lateral and medial malleolus. Both smaller using Iodoflex. He had a surgical area in his posterior mid calf we have been using iodoform. All the wounds are down and dimensions 10/23/16; the patient arrives today with no complaints. He states the Iodoflex is a bit uncomfortable. He is not systemically unwell. We have been using Iodoflex to the lateral right ankle and the medial and Aquacel Ag to the reflux surgical wound on the posterior right calf. All of these wounds are doing well 10/30/16; patient states he has no pain no systemic symptoms. I changed him to [facility] last week. Although the wounds are doing well 11/06/16; patient reports no pain or systemic symptoms. We continue with Hydrofera Blue. Both wound areas on the medial and lateral ankle appear to be doing well with improvement and dimensions and improvement in the wound bed. 11/13/16; patient's dimensions continued to improve. We continue with Hydrofera Blue on the medial and lateral side. Appear to be doing well with healthy granulation and advancing epithelialization 11/20/16; patient's dimensions improving  laterally by about half a centimeter in length. Otherwise no change on the medial side. Using [facility] 12/04/16; no major change in patient's wound dimensions. Intake nurse reports more drainage. 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 He is voicing no complaints or concerns. He has been tolerating his twice weekly compression therapy changes 12/11/16 Patient complains of pain and increased drainage.. wants hydrofera blue 12/18/16 improvement. Sorbact 12/25/16; medial wound is smaller, lateral measures the same. Still on sorbact 01/01/17; medial wound continues to be smaller, lateral measures about  the same however there is clearly advancing epithelialization here as well in fact I think the wound will ultimately divided into 2 open areas 01/08/17; unfortunately today fairly significant regression in several areas. Surface of the lateral wound covered again in adherent necrotic material which is difficult to debridement. He has significant surrounding skin maceration. The expanding area of tissue epithelialization in the middle of the wound that was encouraging last week appears to be smaller. There is no surrounding tenderness. The area on the medial leg also did not seem to be as healthy as last week, the reason for this regression this week is not totally clear. We have been using Sorbact for the last 4 weeks. We'll switched of polymen AG which we will order via home medical supply. If there is a problem with this would switch back to Iodoflex 01/15/18; drainage,odor. No change. Switched to polymen last week 01/22/17; still continuous drainage. Culture I did last week showed a few Proteus pansensitive. I did this culture because of drainage. Put him on Augmentin which she has been taking since Saturday however he is developed 4-5 liquid bowel movements. He is also on Coumadin. Beyond this  wound is not changed at all, still nonviable necrotic surface material which I debrided reveals healthy granulation line 01/29/17; still copious amounts of drainage reported by her intake nurse. Wound measuring slightly smaller. Currently fact on Iodoflex although I'm looking forward to changing back to perhaps [facility] or polymen AG 02/05/17; still large amounts of drainage and presenting with really large amounts of adherent slough and necrotic material over the remaining open area of the wound. We have been using Iodoflex but with little improvement in the surface. Change to [facility] 02/12/17; still large amount of drainage. Much less adherent slough however. Started [employer]	[place] last week [name], SEYDEL ([id number]) 02/19/17; drainage is better this week. Much less adherent slough. Perhaps some improvement in dimensions. Using [facility] 02/26/17; severe venous insufficiency wounds on the right lateral and right medial leg. Drainage is some better and slough is less adherent we've been using Hydrofera Blue 03/05/17 on evaluation today patient appears to be doing well. His wounds have been decreasing in size and overall he is pleased with how this is progressing. We are awaiting approval for the epigraph which has previously been recommended in the meantime the [facility] Dressing is to be doing very well for him. 03/12/17; wound dimensions are smaller still using Hydrofera Blue. Comes in on Fridays for a dressing change. 03/19/17; wound dimensions continued to contract. Healing of this wound is complicated by continuous significant drainage as well as recurrent buildup of necrotic surface material. We looked into Apligraf and he has a $290 co-pay per application but truthfully I think the drainage as well as the nonviable surface would preclude use of Apligraf there are any other skin substitute at this point therefore the continued plan will be debridement each clinic visit,  2 times a week dressing changes and continued use of Hydrofera Blue. improvement has been very slow but sustained 03/26/17 perhaps slight improvements in peripheral epithelialization is especially inferiorly. Still with large amount of drainage and tightly adherent necrotic surface on arrival. Along with the intake nurse I reviewed previous treatment. He worsened on Iodoflex and had a 4 week trial of sorbact, Polymen AG and a long courses of Aquacel Ag. He is not a candidate for advanced treatment options for many reasons 04/09/17 on evaluation today patient's right lower extremity wounds appear to be  doing a little better. Fortunately he has no significant discomfort and has been tolerating the dressing changes including the wraps without complication. With that being said he really does not have swelling anymore compared to what he has had in the past following his vascular intervention. He wonders if potentially we could attempt avoiding the rats to see if he could cleanse the wound in between to try to prevent some of the fiber and its buildup that occurs in the interim between when we see him week to week. No fevers, chills, nausea, or vomiting noted at this time. 04/16/17; noted that the staff made a choice last week not to put him in compression. The patient is changing his dressing at home using [facility] and changing every second day. His wife is washing the wound with saline. He is using kerlix. Surprisingly he has not developed a lot of edema. This choice was made because of the degree of fluid retention and maceration even with changing his dressing twice a week 04/23/17; absolutely no change. Using Hydrofera Blue. Recurrent tightly adherent nonviable surface material. This is been refractory to Iodoflex, sorbact and now Hydrofera Blue. More recently he has been changing his own dressings at home, cleansing the wound in the shower. He has not developed lower extremity edema 04/30/17; if  anything the wound is larger still in adherent surface although it debrided easily today. I've been using Mehta honey for 1 week and I'd like to try and do it for a second week this see if we can get some form of viable surface here. 05/07/17; patient arrives with copious amounts of drainage and some pain in the superior part of the wound. He has not been systemically unwell. He's been changing this daily at home 05/14/17; patient arrives today complaining of less drainage and less pain. Dimensions slightly better. Surface culture I did of this last week grew MSSA I put him on dicloxacillin for 10 days. Patient requesting a further prescription since he feels so much better. He did not obtain the [facility] AG for reasons that aren't clear, he has been using Hydrofera Blue 05/21/17; perhaps some less drainage and less pain. He is completing another week of doxycycline. Unfortunately there is no change in the wound measurements are appearance still tightly adherent necrotic surface material that is really defied treatment. We have been using Hydrofera Blue most recently. He did not manage to get Anasept. He was told it was prescription at [facility] 05/29/17; absolutely no improvement in these wound areas. He had remote plastic surgery with who was done at [facility] in [place] surrounding this area. This was a traumatic wound with extensive plastic surgery/skin grafting at that time. I switched him to sorbact last week to see if he can do anything about the recurrent necrotic surface and drainage. This is largely been refractory to Iodoflex/Hydrofera Blue/alginates. I believe we tried Elby Showers and more recently Medi honey l however the drainage is really too excessive 06/04/17; patient went to see Dr. Ulice Bold. Per the patient she ordered him a compression stocking. Continued the Anasept gel and sorbact was already on. No major improvement in the condition of the wounds in fact the medial wound looks  larger. Again per the patient she did not feel a skin graft or operative debridement was indicated The patient had previous venous ablation in February 2018. Last arterial studies were in December 2017 and were within normal limits 06/18/17; patient arrives back in clinic today using Anasept gel with sorbact. He  changes this every day is wearing his own compression stocking. The patient actually saw Dr. Leta Baptist of plastic surgery. I've reviewed her note. The appointment was on 05/30/17. The basic issue with here was that she did not feel that skin grafts for patients with underlying stasis and edema have a good long-term success rate. She also discuss skin substitutes which has been done here as well however I have not been able to get the surface of these wounds to something that I think would support skin substitutes. This is why he felt he might need an ADRICK, [name]. ([id number]) operative debridement more aggressively than I can do in this clinic Review of systems; is otherwise negative he feels well 07/02/17; patient has been using Anasept gel with Sorbact. He changes this every day scrubs out the wound beds. Arrives today with the wound bed looking some better. Easier debridement 07/16/17; patient has been using Anasept gel was sorbact for about a month now. The necrotic service on his wound bed is better and the drainage is now down to minimal but we've not made any improvements in the epithelialization. Change to Santyl today. Surface of the wound is still not good enough to support skin substitutes 07/30/17 on evaluation today patient appears to be doing very well in regard to his right bimalleolus wounds. He has been tolerating the treatment with the Anasept gel and sorbact. However we were going to try and switch to Santyl to be more aggressive with these wounds. This was however cost prohibitive. Therefore we will likely go back to the sorbact. Nonetheless he is not having any  significant discomfort he still is forming slough over the wound location but nothing too significant. The wounds do appear to be filling in nicely. 08/13/17; I have not seen this wound in about a month. There is not much change. The fact the area medially looks larger. He has been using sorbact and Anasept for over a month now without much effect. Arrives in clinic stating that he does not want the area debrided 08/28/17; patient arrives with the areas on his right medial and right lateral ankle worse. The wounds of expanded there is erythema and still drainage. There is no pain and no tenderness. We had been using Keracel AG clearly not doing well. 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 He states that even the evening that we began utilizing the Iodoflex he started having burning. Subsequently this never really improved and he eventually discontinue the use of the Iodoflex altogether. Unfortunately he did not let us know about any of this until today. I'm unsure of any specific infection at this point although I am going to obtain a wound culture today in order to see if there's anything that could potentially be causing an infection as well. It does sound as if he's had the Iodoflex before and did not have this kind of reaction although this does sound very specific for a reaction to the Iodoflex. 09/17/17 on evaluation today patient appears to be doing significantly better compared to last week's evaluation. At that time it actually appears that he did have an infection the good news is that we have been able to get this under control with  switching to the [facility] solution he's not having as much pain and discomfort he still does have  some erythema surrounding the medial aspect wound. He has been tolerating the Dakin's soaked gauze dressing which is excellent and that his wounds do not seem to have as much adherent slough. Overall I'm pleased with the progress she's made in last week. 11/05/17 on evaluation today patient appears to be doing better in regard to his right medial and lateral malleolus ulcers. He has been tolerating the dressing changes without complication. I do flight the Freada Bergeron is helping with additional granulation at this point in the wound beds do appear to be a little bit better. With that being said patient does not appear to have any evidence of significant infection at this point there is no erythema as he has previously noted he does note that the 30 day course of antibiotics I placed him on will end tomorrow. 11/12/17 on evaluation today patient actually appears to be doing excellent in regard to his right medial and lateral malleolus ulcers. He has been tolerating the dressing changes without complication. Fortunately he has no evidence of infection at this time and overall I believe he is progressing extremely nicely he has a lot of new epithelialization noted on evaluation today. Patient likewise is very pleased with the progress even just since last week. 11/19/17 on evaluation today patient appears to be doing about the same in regard to his ulcerations. He does not have any additional breakdown although he really has not noted any significant improvement either over the past week. With that being said the more concerning thing at this time is that he is beginning to show signs of erythema surrounding both wounds which is what typically happens and then he will develop into a more overt infection. This has been the course for some time. I hope that doing the 30 day in a biotic cycle this past time would break that so that he would not have the same type of thing happen again. Nonetheless it appears that  is digressing back into this infected state unfortunately. With that being said he is not having any pain currently which is good he typically does start to hurt more as this progresses. Fortunately there does not appear to be any evidence of infection systemically which is good news. 11/26/17 on evaluation today patient appears to actually be doing rather well in regard to his ulcer compared to last week. He still has your theme and noted around both the medial and lateral malleolus ulcers of the right lower extremity. He does not seem to be quite as overtly infected however. He has been tolerating the dressing changes without complication which is good news. The gentamicin cream does seem to have been of benefit for him. With that being said he does actually have an appointment with Dr. Sampson Goon this morning to see if there's anything that would be recommended in the way of IV antibiotic Gatchel, Jaccob C. ([id number]) therapy. Otherwise he still has slough noted on the surface of the wound. 12/03/17 on evaluation today patient presents for follow-up concerning his ongoing right lower extremity ulcers. He has been tolerating the dressing changes without complication he states he has not really been using gentamicin on the lateral ankle and this seems to be doing very well. For that reason I think that is probably fine to put a hold on the gentamicin he states his burns and stings when he applies it and we maybe be able  to just use this intermittently when things become more irritated. Other than that the good news is I did review his MRI which was performed on 11/28/17 and revealed that he has a negative MRI for abscess, osteomyelitis, or septic joint. Obviously these were the issues that I was concerned with the possibility of and I'm pleased to find that there is no problems in that regard. I did also review Dr. Jarrett Ables note from infectious disease he discussed performed some lab work which  apparently was done and then subsequently was going to consider the possibility of Keflex long-term for prevention of infection although this has not been prescribed as of yet. 12/09/17 on evaluation today patient appears to be doing a little bit worse in regard to his right medial and lateral malleolus ulcers. Fortunately the wound does not seem to be significantly worse although he does have a lot more drainage especially the lateral aspect he is having a lot more pain than what he has noted more recently. Fortunately there does not appear to be any evidence of systemic infection which is good news. Again he has seen Dr. Sampson Goon but he has not been placed on the potential Keflex which was recommended as a possibility at least yet. 12/17/17 unfortunately on evaluation today the patient's wound appears to be doing much worse. He has been performing the dressing changes as directed. Upon a review of notes in epic it does appear that Dr. Jarrett Ables office specifically sending Keflex for the patient on the 16th which was last Tuesday. With that being said the patient states that though this was sent into [facility] that [facility] stated they never received it and subsequently the patient never took anything. He tells me he has been in bed since Friday due to the increased pain and overall general malaise. No fevers, chills, nausea, or vomiting noted at this time. 02/25/18 on evaluation today patient appears to be doing rather well in regard to his bilateral right malleolus ulcers. He has been tolerating the dressing changes without complication. Currently there does not appear to be any evidence of infection. Overall I'm very pleased with the progress that seems to been made up to this point. 03/11/18 on evaluation today patient actually appears to be doing very well in regard to his right ankle ulcers. He's been tolerating the dressing changes without complication. Fortunately there does not appear to be  any evidence of sniffing infection I do believe that the gentamicin cream continues to be of benefit for him. Fortunately he is showing signs of healing in which time I see him. He also tells me he's having no pain. 03/25/18 on evaluation today patient actually appears to show signs of improvement especially in regard to the right lateral ankle ulcer. He has been tolerating the dressing changes without complication. In general I'm very pleased with the progress that has been made up to this point. 04/08/18- He presents in follow up evaluation for right bimalleolar wounds; there is essentially no change. He admits to removing the dressing at night to allow the wounds to be "air out". We will continue with same treatment plan and he will follow up in two weeks 04/22/18 on evaluation today patient seems very frustrated with this situation currently as far as the ulcer on his right medial and lateral malleolus is concerned. He states this has been going on a very long time and obviously he does have a lot of bills as well is far as wound care is concerned. With that  being said he tells me that he's been tolerating the dressing changes well although he did clarify that what he is doing is putting the medicine on in the morning after shower and then subsequently he takes the dressing off at night leaving it open to air. I previously asked him about this and I was not aware that he was doing this. Nonetheless that may be slowing things down a bit at this point. Fortunately there does not appear to be any evidence of severe infection although he does have some evidence of your theme is surrounding the wound bed. He is still using the gentamicin cream. 05/06/18 on evaluation today patient actually presents for evaluation of his right medial malleolus ulcers. He's been tolerating the dressing changes without complication. With that being said it does appear that he is doing better compared to last evaluation.  He's not having the pain like he did previous. He also states that blisters that he had coming up when I saw him last did resolve and ended up doing okay he did go to the ER they gave him some cream to put on it he's not sure what the name of the cream was. He was down in [facility] when he called me and I spoke with him previous. Nonetheless he states that they told him to put the medicine on his our due list. With that being said I'm not really 100% sure that the medicine had anything to do with this due to the fact that again he was having the blisters when he came into the office which is why we actually put them on the anabiotic I was concerned about him having an infection which was causing the blistering and increased pain in his extremity. Nonetheless this is something that we can definitely be cautious of in the future it was Bactrim Cancelliere, Demetris C. ([id number]) that we had him on. Overall I'm glad he is doing better. Patient History Information obtained from Patient. Social History Former smoker - 10 years ago, Marital Status - Married, Alcohol Use - Moderate, Drug Use - No History, Caffeine Use - Never. Medical And Surgical History Notes Constitutional Symptoms (General Health) Vein Filter (groin area); CODA; H/O Blood Clots; pulmonary hypertensive arterial disease Review of Systems (ROS) Constitutional Symptoms (General Health) Denies complaints or symptoms of Fever, Chills. Respiratory The patient has no complaints or symptoms. Cardiovascular The patient has no complaints or symptoms. Psychiatric The patient has no complaints or symptoms. Objective Constitutional Well-nourished and well-hydrated in no acute distress. Vitals Time Taken: 8:00 AM, Height: 76 in, Weight: 238 lbs, BMI: 29, Temperature: 98.3 F, Pulse: 78 bpm, Respiratory Rate: 18 breaths/min, Blood Pressure: 155/68 mmHg. Respiratory normal breathing without difficulty. Psychiatric this patient is able  to make decisions and demonstrates good insight into disease process. Alert and Oriented x 3. pleasant and cooperative. General Notes: Patient's wound bed actually show signs of good improvement at this point in time compared to previous he did have some Slough noted that of course we did not do any sharp debridement at the last visit. For that reason I do think that that's gonna be required today definitely. Since he's not having pain I think this is a good time to do so. I did perform sharp debridement which patient tolerated without any discomfort post debridement both wound bed [name] appear to be doing significantly better. Integumentary (Hair, Skin) Wound #1 status is Open. Original cause of wound was Gradually Appeared. The wound is located on the  Right,Lateral Malleolus. The wound measures 4.5cm length x 6cm width x 0.2cm depth; 21.206cm^2 area and 4.241cm^3 volume. There is Fat Layer (Subcutaneous Tissue) Exposed exposed. There is no tunneling or undermining noted. There is a medium amount of serosanguineous drainage noted. The wound margin is distinct with the outline attached to the wound base. There is small Toren, Darrian C. ([id number]) (1-33%) pink granulation within the wound bed. There is a large (67-100%) amount of necrotic tissue within the wound bed including Eschar and Adherent Slough. The periwound skin appearance exhibited: Scarring. The periwound skin appearance did not exhibit: Callus, Crepitus, Excoriation, Induration, Rash, Dry/Scaly, Maceration, Atrophie Blanche, Cyanosis, Ecchymosis, Hemosiderin Staining, Mottled, Pallor, Rubor, Erythema. Periwound temperature was noted as No Abnormality. The periwound has tenderness on palpation. Wound #2 status is Open. Original cause of wound was Gradually Appeared. The wound is located on the Right,Medial Malleolus. The wound measures 4.2cm length x 3cm width x 0.2cm depth; 9.896cm^2 area and 1.979cm^3 volume. There is Fat Layer  (Subcutaneous Tissue) Exposed exposed. There is no tunneling or undermining noted. There is a medium amount of serosanguineous drainage noted. The wound margin is distinct with the outline attached to the wound base. There is small (1-33%) red granulation within the wound bed. There is a large (67-100%) amount of necrotic tissue within the wound bed including Adherent Slough. The periwound skin appearance did not exhibit: Callus, Crepitus, Excoriation, Induration, Rash, Scarring, Dry/Scaly, Maceration, Atrophie Blanche, Cyanosis, Ecchymosis, Hemosiderin Staining, Mottled, Pallor, Rubor, Erythema. Periwound temperature was noted as No Abnormality. The periwound has tenderness on palpation. 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 There was a Excisional Skin/Subcutaneous Tissue Debridement with a total area of 27 sq cm performed by [name], Leron Stoffers E., PA-C. With the following instrument(s): Curette to remove Viable and Non-Viable tissue/material. Material removed includes Subcutaneous Tissue and Slough and after achieving pain control using Lidocaine 4% Topical Solution. No specimens were taken. A time out was conducted at 08:28, prior to the start of the procedure. A Minimum amount of bleeding was controlled with Pressure. The procedure was tolerated well with a pain level of 0 throughout and a pain level of 0 following the procedure. Patient s Level of Consciousness post procedure was recorded as Awake  and Alert. Post Debridement Measurements: 4.5cm length x 6cm width x 0.3cm depth; 6.362cm^3 volume. Character of Wound/Ulcer Post Debridement is improved. Severity of Tissue Post Debridement is: Fat layer exposed. 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 There was a Excisional Skin/Subcutaneous Tissue Debridement with a total area of 12.6 sq cm performed by [name], Jayceon Troy E., PA-C. With the following instrument(s): Curette to remove Viable and Non-Viable tissue/material. Material removed includes Subcutaneous Tissue and Slough and after achieving pain control using Lidocaine 4% Topical Solution. No specimens were taken. A time out was conducted at 08:31, prior to the start of the procedure. A Minimum amount of bleeding was controlled with Pressure. The procedure was tolerated well with a  pain level of 0 throughout and a pain level of 0 following the procedure. Patient s Level of Consciousness post procedure was recorded as Awake and Alert. Post Debridement Measurements: 4.2cm length x 3cm width x 0.3cm depth; 2.969cm^3 volume. Character of Wound/Ulcer Post Debridement is improved. Severity of Tissue Post Debridement is: Fat layer exposed. [name], [name] ([id number]) Post procedure Diagnosis Wound #2: Same as Pre-Procedure Plan Wound Cleansing: Wound #1 Right,Lateral Malleolus: Clean wound with Normal Saline. May Shower, gently pat wound dry prior to applying new dressing. - wash with hibiclens daily and leave on 5 mins Wound #2 Right,Medial Malleolus: Clean wound with Normal Saline. May Shower, gently pat wound dry prior to applying new dressing. - wash with hibiclens and leave on 5 mins Anesthetic (add to Medication List): Wound #1 Right,Lateral Malleolus: Topical Lidocaine 4% cream applied to wound bed  prior to debridement (In Clinic Only). Wound #2 Right,Medial Malleolus: Topical Lidocaine 4% cream applied to wound bed prior to debridement (In Clinic Only). Primary Wound Dressing: Wound #1 Right,Lateral Malleolus: Gentamicin Sulfate Cream - only on wound bed Hydrafera Blue Ready Transfer - use hydrafera blue classic in the home and moisten with saline Wound #2 Right,Medial Malleolus: Gentamicin Sulfate Cream - only on wound bed Hydrafera Blue Ready Transfer - use hydrafera blue classic in the home and moisten with saline Secondary Dressing: Wound #1 Right,Lateral Malleolus: Dry Gauze Conform/Kerlix Wound #2 Right,Medial Malleolus: Dry Gauze Conform/Kerlix Dressing Change Frequency: Wound #1 Right,Lateral Malleolus: Change dressing every other day. Wound #2 Right,Medial Malleolus: Change dressing every other day. Follow-up Appointments: Wound #1 Right,Lateral Malleolus: Return Appointment in 2 weeks. Wound #2 Right,Medial Malleolus: Return Appointment in 2 weeks. Edema Control: Wound #1 Right,Lateral Malleolus: Elevate legs to the level of the heart and pump ankles as often as possible Wound #2 Right,Medial Malleolus: Elevate legs to the level of the heart and pump ankles as often as possible Additional Orders / Instructions: Wound #1 Right,Lateral Malleolus: Increase protein intake. Wound #2 Right,Medial Malleolus: Increase protein intake. MAKHI, MUZQUIZ ([id number]) Brion Aliment suggest currently that we continue with the above wound care measures for the next week. The patient is in agreement with plan. We will subsequently see were things stand at follow-up. In regard to the medication we can definitely add that as an alert for him as far as elegies are concerned although again I'm not sure that the Bactrim is truly a causative agent for the blistering since he Artie had the blistering previous. Nonetheless the patient would like for [place] to add this to his  list. Please see above for specific wound care orders. We will see patient for re-evaluation in 2 weeks(s) here in the clinic. If anything worsens or changes patient will contact our office for additional recommendations. Electronic Signature(s) Signed: 05/06/2018 1:59:57 PM By: [name] Kelp PA-C Entered By: [name] on 05/06/2018 08:42:30 [name] ([id number]) -------------------------------------------------------------------------------- ROS/PFSH Details Patient Name: [name] Date of Service: 05/06/2018 8:00 AM Medical Record Number: [id number] Patient Account Number: [id number] Date of Birth/Sex: Jun 16, 1946 (72 y.o. M) Treating RN: [name] Primary Care Provider: Darreld [name] Other Clinician: Referring Provider: Darreld [name] Treating Provider/Extender: [name], Jammy Plotkin Weeks in Treatment: 120 Information Obtained From Patient Wound History Do you currently have one or more open woundso Yes How many open wounds do you currently haveo 1 Approximately how long have you had your woundso 5 months How have you been treating your wound(s) until nowo saline, dressing Has your wound(s)  ever healed and then re-openedo Yes Have you had any lab work done in the past montho No Have you tested positive for an antibiotic resistant organism (MRSA, VRE)o No Have you tested positive for osteomyelitis (bone infection)o No Have you had any tests for circulation on your legso No Constitutional Symptoms (General Health) Complaints and Symptoms: Negative for: Fever; Chills Medical History: Past Medical History Notes: Vein Filter (groin area); CODA; H/O Blood Clots; pulmonary hypertensive arterial disease Eyes Medical History: Positive for: Cataracts - removed Negative for: Glaucoma; Optic Neuritis Ear/Nose/Mouth/Throat Medical History: Negative for: Chronic sinus problems/congestion Hematologic/Lymphatic Medical History: Negative for: Anemia; Hemophilia;  Human Immunodeficiency Virus; Lymphedema; Sickle Cell Disease Respiratory Complaints and Symptoms: No Complaints or Symptoms Medical History: Positive for: Chronic Obstructive Pulmonary Disease (COPD) Negative for: Aspiration; Asthma; Pneumothorax; Sleep Apnea; Tuberculosis Cardiovascular KINGDAVID, LEINBACH. ([id number]) Complaints and Symptoms: No Complaints or Symptoms Medical History: Negative for: Angina; Arrhythmia; Congestive Heart Failure; Coronary Artery Disease; Deep Vein Thrombosis; Hypertension; Hypotension; Myocardial Infarction; Peripheral Arterial Disease; Peripheral Venous Disease; Phlebitis; Vasculitis Gastrointestinal Medical History: Negative for: Cirrhosis ; Colitis; Crohnos; Hepatitis A; Hepatitis B; Hepatitis C Endocrine Medical History: Negative for: Type I Diabetes; Type II Diabetes Genitourinary Medical History: Negative for: End Stage Renal Disease Immunological Medical History: Negative for: Lupus Erythematosus; Raynaudos Integumentary (Skin) Medical History: Negative for: History of Burn; History of pressure wounds Musculoskeletal Medical History: Positive for: Osteoarthritis Negative for: Gout; Rheumatoid Arthritis; Osteomyelitis Neurologic Medical History: Negative for: Dementia; Neuropathy; Quadriplegia; Paraplegia; Seizure Disorder Oncologic Medical History: Negative for: Received Chemotherapy; Received Radiation Psychiatric Complaints and Symptoms: No Complaints or Symptoms Medical History: Negative for: Anorexia/bulimia; Confinement Anxiety HBO Extended History Items MARTRELL, EGUIA. ([id number]) Eyes: Cataracts Immunizations Pneumococcal Vaccine: Received Pneumococcal Vaccination: No Implantable Devices Family and Social History Former smoker - 10 years ago; Marital Status - Married; Alcohol Use: Moderate; Drug Use: No History; Caffeine Use: Never; Advanced Directives: No; Patient does not want information on Advanced  Directives; Living Will: No; Medical Power of Attorney: No Physician Affirmation I have reviewed and agree with the above information. Electronic Signature(s) Signed: 05/06/2018 1:59:57 PM By: [name] Kelp PA-C Signed: 05/06/2018 4:54:17 PM By: [name] Entered By: [name] on 05/06/2018 08:40:10 [name] ([id number]) -------------------------------------------------------------------------------- SuperBill Details Patient Name: [name] Date of Service: 05/06/2018 Medical Record Number: [id number] Patient Account Number: [id number] Date of Birth/Sex: [birth year] (72 y.o. 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, sequela T81.31XS Disruption of external operation (surgical) wound, not elsewhere classified, sequela Facility Procedures CPT4 Code: 38756433 Description: 11042 - DEB SUBQ TISSUE 20 SQ CM/< ICD-10 Diagnosis Description L97.312 Non-pressure chronic ulcer of right ankle with fat layer ex Modifier: posed Quantity: 1 CPT4 Code: 29518841 Description: 11045 - DEB SUBQ TISS EA ADDL 20CM ICD-10 Diagnosis Description L97.312 Non-pressure chronic ulcer of right ankle with fat layer ex Modifier: posed Quantity: 1 Physician Procedures CPT4 Code: 6606301 Description: 11042 - WC PHYS SUBQ TISS 20 SQ CM ICD-10 Diagnosis Description L97.312 Non-pressure chronic ulcer of right ankle with fat layer exp Modifier: osed Quantity: 1 CPT4 Code: 6010932 Description: 11045 - WC PHYS SUBQ TISS EA ADDL 20 CM ICD-10  Diagnosis Description L97.312 Non-pressure chronic ulcer of right ankle with fat layer exp Modifier: osed Quantity: 1 Electronic Signature(s) Signed: 05/06/2018 1:59:57 PM By: [name] Kelp PA-C Entered By: [name] on 05/06/2018 08:42:45

## 2018-05-07 NOTE — Progress Notes (Signed)
[name], [name] ([id number]) Visit Report for 05/06/2018 Arrival Information Details Patient Name: [name], [name]. Date of Service: 05/06/2018 8:00 AM Medical Record Number: [id number] Patient Account Number: [id number] Date of Birth/Sex: 12-11-1945 (72 y.o. M) Treating RN: Curtis Sites Primary Care Caralynn Gelber: [name] Other Clinician: Referring Arvell Pulsifer: [name] Treating Kermitt Harjo/Extender: STONE III, HOYT Weeks in Treatment: 120 Visit Information History Since Last Visit Added or deleted any medications: No Patient Arrived: Ambulatory Any new allergies or adverse reactions: No Arrival Time: 08:00 Had a fall or experienced change in No Accompanied By: self activities of daily living that may affect Transfer Assistance: None risk of falls: Patient Identification Verified: Yes Signs or symptoms of abuse/neglect since last visito No Secondary Verification Process Yes Hospitalized since last visit: No Completed: Implantable device outside of the clinic excluding No Patient Requires Transmission-Based No cellular tissue based products placed in the center Precautions: since last visit: Patient Has Alerts: Yes Has Dressing in Place as Prescribed: Yes Patient Alerts: Patient on Blood Pain Present Now: No Thinner Electronic Signature(s) Signed: 05/06/2018 11:45:15 AM By: Dayton Martes RCP, RRT, CHT Entered By: Dayton Martes on 05/06/2018 08:02:03 [name] ([id number]) -------------------------------------------------------------------------------- Encounter Discharge Information Details Patient Name: [name] Date of Service: 05/06/2018 8:00 AM Medical Record Number: [id number] Patient Account Number: [id number] Date of Birth/Sex: [birth year] (72 y.o. M) Treating RN: Curtis Sites Primary Care Yuktha Kerchner: [name] Other Clinician: Referring Dema Timmons: [name] Treating Rogue Pautler/Extender: Linwood Dibbles,  HOYT Weeks in Treatment: 120 Encounter Discharge Information Items Discharge Condition: Stable Ambulatory Status: Ambulatory Discharge Destination: Home Transportation: Private Auto Accompanied By: self Schedule Follow-up Appointment: Yes Clinical Summary of Care: Electronic Signature(s) Signed: 05/06/2018 4:54:17 PM By: Curtis Sites Entered By: Curtis Sites on 05/06/2018 08:46:06 [name] ([id number]) -------------------------------------------------------------------------------- Lower Extremity Assessment Details Patient Name: [name] Date of Service: 05/06/2018 8:00 AM Medical Record Number: [id number] Patient Account Number: [id number] Date of Birth/Sex: 09-23-1945 (72 y.o. M) Treating RN: Renne Crigler Primary Care Juliauna Stueve: [name] Other Clinician: Referring Milaya Hora: [name] Treating Solae Norling/Extender: STONE III, HOYT Weeks in Treatment: 120 Edema Assessment Assessed: [Left: No] [Right: No] Edema: [Left: Ye] [Right: s] Calf Left: Right: Point of Measurement: 40 cm From Medial Instep cm 35.8 cm Ankle Left: Right: Point of Measurement: 16 cm From Medial Instep cm 22 cm Vascular Assessment Pulses: Dorsalis Pedis Palpable: [Right:Yes] Posterior Tibial Extremity colors, hair growth, and conditions: Extremity Color: [Right:Hyperpigmented] Hair Growth on Extremity: [Right:No] Temperature of Extremity: [Right:Warm] Capillary Refill: [Right:< 3 seconds] Toe Nail Assessment Left: Right: Thick: Yes Discolored: Yes Deformed: No Improper Length and Hygiene: No Electronic Signature(s) Signed: 05/06/2018 4:32:14 PM By: Renne Crigler Entered By: Renne Crigler on 05/06/2018 08:10:28 [name] ([id number]) -------------------------------------------------------------------------------- Multi Wound Chart Details Patient Name: [name] Date of Service: 05/06/2018 8:00 AM Medical Record Number:  [id number] Patient Account Number: [id number] Date of Birth/Sex: 08-06-1946 (72 y.o. M) Treating RN: Curtis Sites Primary Care Brent Taillon: [name] Other Clinician: Referring Ninfa Giannelli: [name] Treating Monna Crean/Extender: STONE III, HOYT Weeks in Treatment: 120 Vital Signs Height(in): 76 Pulse(bpm): 78 Weight(lbs): 238 Blood Pressure(mmHg): 155/68 Body Mass Index(BMI): 29 Temperature(F): 98.3 Respiratory Rate 18 (breaths/min): Photos: [1:No Photos] [2:No Photos] [N/A:N/A] Wound Location: [1:Right Malleolus - Lateral] [2:Right Malleolus - Medial] [N/A:N/A] Wounding Event: [1:Gradually Appeared] [2:Gradually Appeared] [N/A:N/A] Primary Etiology: [1:Venous Leg Ulcer] [2:Venous Leg Ulcer] [N/A:N/A] Comorbid History: [1:Cataracts, Chronic Obstructive Cataracts, Chronic Obstructive N/A Pulmonary Disease (COPD), Pulmonary Disease (COPD), Osteoarthritis] [2:Osteoarthritis] Date Acquired: [1:08/11/2015] [2:01/30/2016] [  N/A:N/A] Weeks of Treatment: [1:120] [2:118] [N/A:N/A] Wound Status: [1:Open] [2:Open] [N/A:N/A] Clustered Wound: [1:No] [2:Yes] [N/A:N/A] Measurements L x W x D [1:4.5x6x0.2] [2:4.2x3x0.2] [N/A:N/A] (cm) Area (cm) : [1:21.206] [2:9.896] [N/A:N/A] Volume (cm) : [1:4.241] [2:1.979] [N/A:N/A] % Reduction in Area: [1:-22.70%] [2:14.30%] [N/A:N/A] % Reduction in Volume: [1:18.20%] [2:-71.30%] [N/A:N/A] Classification: [1:Full Thickness Without Exposed Support Structures] [2:Full Thickness Without Exposed Support Structures] [N/A:N/A] Exudate Amount: [1:Medium] [2:Medium] [N/A:N/A] Exudate Type: [1:Serosanguineous] [2:Serosanguineous] [N/A:N/A] Exudate Color: [1:red, brown] [2:red, brown] [N/A:N/A] Wound Margin: [1:Distinct, outline attached] [2:Distinct, outline attached] [N/A:N/A] Granulation Amount: [1:Small (1-33%)] [2:Small (1-33%)] [N/A:N/A] Granulation Quality: [1:Pink] [2:Red] [N/A:N/A] Necrotic Amount: [1:Large (67-100%)] [2:Large (67-100%)]  [N/A:N/A] Necrotic Tissue: [1:Eschar, Adherent Slough] [2:Adherent Slough] [N/A:N/A] Exposed Structures: [1:Fat Layer (Subcutaneous Tissue) Exposed: Yes Fascia: No Tendon: No Muscle: No Joint: No Bone: No] [2:Fat Layer (Subcutaneous Tissue) Exposed: Yes Fascia: No Tendon: No Muscle: No Joint: No Bone: No] [N/A:N/A] Epithelialization: [1:None] [2:None] [N/A:N/A] Periwound Skin Texture: [1:Scarring: Yes Excoriation: No] [2:Excoriation: No Induration: No] [N/A:N/A] Induration: No Callus: No Callus: No Crepitus: No Crepitus: No Rash: No Rash: No Scarring: No Periwound Skin Moisture: Maceration: No Maceration: No N/A Dry/Scaly: No Dry/Scaly: No Periwound Skin Color: Atrophie Blanche: No Atrophie Blanche: No N/A Cyanosis: No Cyanosis: No Ecchymosis: No Ecchymosis: No Erythema: No Erythema: No Hemosiderin Staining: No Hemosiderin Staining: No Mottled: No Mottled: No Pallor: No Pallor: No Rubor: No Rubor: No Temperature: No Abnormality No Abnormality N/A Tenderness on Palpation: Yes Yes N/A Wound Preparation: Ulcer Cleansing: Ulcer Cleansing: N/A Rinsed/Irrigated with Saline Rinsed/Irrigated with Saline Topical Anesthetic Applied: Topical Anesthetic Applied: Other: lidocaine 4% Other: LIDOCAINE 4% Treatment Notes Electronic Signature(s) Signed: 05/06/2018 4:54:17 PM By: Curtis Sites Entered By: Curtis Sites on 05/06/2018 08:26:57 [name] ([id number]) -------------------------------------------------------------------------------- Multi-Disciplinary Care Plan Details Patient Name: [name] Date of Service: 05/06/2018 8:00 AM Medical Record Number: [id number] Patient Account Number: [id number] Date of Birth/Sex: 1946/04/19 (72 y.o. M) Treating RN: Curtis Sites Primary Care Poseidon Pam: [name] Other Clinician: Referring Reilyn Nelson: [name] Treating Maniyah Moller/Extender: STONE III, HOYT Weeks in Treatment: 120 Active Inactive ` Venous  Leg Ulcer Nursing Diagnoses: Knowledge deficit related to disease process and management Goals: Patient will maintain optimal edema control Date Initiated: 04/03/2016 Target Resolution Date: 11/24/2016 Goal Status: Active Interventions: Compression as ordered Treatment Activities: Therapeutic compression applied : 04/03/2016 Notes: ` Wound/Skin Impairment Nursing Diagnoses: Impaired tissue integrity Goals: Ulcer/skin breakdown will heal within 14 weeks Date Initiated: 01/17/2016 Target Resolution Date: 11/24/2016 Goal Status: Active Interventions: Assess ulceration(s) every visit Notes: Electronic Signature(s) Signed: 05/06/2018 4:54:17 PM By: Curtis Sites Entered By: Curtis Sites on 05/06/2018 08:25:33 [name] ([id number]) -------------------------------------------------------------------------------- Pain Assessment Details Patient Name: [name] Date of Service: 05/06/2018 8:00 AM Medical Record Number: [id number] Patient Account Number: [id number] Date of Birth/Sex: September 11, 1945 (72 y.o. M) Treating RN: Curtis Sites Primary Care Ashleyann Shoun: [name] Other Clinician: Referring Syris Brookens: [name] Treating Jarrell Armond/Extender: STONE III, HOYT Weeks in Treatment: 120 Active Problems Location of Pain Severity and Description of Pain Patient Has Paino No Site Locations Pain Management and Medication Current Pain Management: Electronic Signature(s) Signed: 05/06/2018 11:45:15 AM By: Dayton Martes RCP, RRT, CHT Signed: 05/06/2018 4:54:17 PM By: Curtis Sites Entered By: Dayton Martes on 05/06/2018 08:02:12 [name] ([id number]) -------------------------------------------------------------------------------- Patient/Caregiver Education Details Patient Name: [name] Date of Service: 05/06/2018 8:00 AM Medical Record Number: [id number] Patient Account Number: [id number] Date of Birth/Gender:  05-17-1946 (72 y.o. M) Treating RN: Curtis Sites Primary Care Physician: MILES,  LINDA Other Clinician: Referring Physician: MILES, LINDA Treating Physician/Extender: Linwood Dibbles, HOYT Weeks in Treatment: 120 Education Assessment Education Provided To: Patient Education Topics Provided Wound/Skin Impairment: Handouts: Other: wound care as ordered Methods: Demonstration, Explain/Verbal Responses: State content correctly Electronic Signature(s) Signed: 05/06/2018 4:54:17 PM By: Curtis Sites Entered By: Curtis Sites on 05/06/2018 08:46:21 [name] ([id number]) -------------------------------------------------------------------------------- Wound Assessment Details Patient Name: [name] Date of Service: 05/06/2018 8:00 AM Medical Record Number: [id number] Patient Account Number: [id number] Date of Birth/Sex: 04/03/1946 (72 y.o. M) Treating RN: Renne Crigler Primary Care Fredrich Cory: [name] Other Clinician: Referring Zyion Doxtater: [name] Treating Carnel Stegman/Extender: STONE III, HOYT Weeks in Treatment: 120 Wound Status Wound Number: 1 Primary Venous Leg Ulcer Etiology: Wound Location: Right Malleolus - Lateral Wound Open Wounding Event: Gradually Appeared Status: Date Acquired: 08/11/2015 Comorbid Cataracts, Chronic Obstructive Pulmonary Weeks Of Treatment: 120 History: Disease (COPD), Osteoarthritis Clustered Wound: No Photos Photo Uploaded By: Renne Crigler on 05/06/2018 10:15:44 Wound Measurements Length: (cm) 4.5 Width: (cm) 6 Depth: (cm) 0.2 Area: (cm) 21.206 Volume: (cm) 4.241 % Reduction in Area: -22.7% % Reduction in Volume: 18.2% Epithelialization: None Tunneling: No Undermining: No Wound Description Full Thickness Without Exposed Support Classification: Structures Wound Margin: Distinct, outline attached Exudate Medium Amount: Exudate Type: Serosanguineous Exudate Color: red, brown Foul Odor After Cleansing:  No Slough/Fibrino Yes Wound Bed Granulation Amount: Small (1-33%) Exposed Structure Granulation Quality: Pink Fascia Exposed: No Necrotic Amount: Large (67-100%) Fat Layer (Subcutaneous Tissue) Exposed: Yes Necrotic Quality: Eschar, Adherent Slough Tendon Exposed: No Muscle Exposed: No Joint Exposed: No Bone Exposed: No [name], [name] ([id number]) Periwound Skin Texture Texture Color No Abnormalities Noted: No No Abnormalities Noted: No Callus: No Atrophie Blanche: No Crepitus: No Cyanosis: No Excoriation: No Ecchymosis: No Induration: No Erythema: No Rash: No Hemosiderin Staining: No Scarring: Yes Mottled: No Pallor: No Moisture Rubor: No No Abnormalities Noted: No Dry / Scaly: No Temperature / Pain Maceration: No Temperature: No Abnormality Tenderness on Palpation: Yes Wound Preparation Ulcer Cleansing: Rinsed/Irrigated with Saline Topical Anesthetic Applied: Other: lidocaine 4%, Treatment Notes Wound #1 (Right, Lateral Malleolus) 1. Cleansed with: Clean wound with Normal Saline 2. Anesthetic Topical Lidocaine 4% cream to wound bed prior to debridement 3. Peri-wound Care: Other peri-wound care (specify in notes) 4. Dressing Applied: Hydrafera Blue 5. Secondary Dressing Applied ABD Pad Kerlix/Conform Notes gentamycin cream Electronic Signature(s) Signed: 05/06/2018 4:32:14 PM By: Renne Crigler Signed: 05/06/2018 4:54:17 PM By: Curtis Sites Entered By: Curtis Sites on 05/06/2018 08:26:18 [name] ([id number]) -------------------------------------------------------------------------------- Wound Assessment Details Patient Name: [name] Date of Service: 05/06/2018 8:00 AM Medical Record Number: [id number] Patient Account Number: [id number] Date of Birth/Sex: 11/18/1945 (72 y.o. M) Treating RN: Renne Crigler Primary Care Damian Buckles: [name] Other Clinician: Referring Ciela Mahajan: [name] Treating  Shakeila Pfarr/Extender: STONE III, HOYT Weeks in Treatment: 120 Wound Status Wound Number: 2 Primary Venous Leg Ulcer Etiology: Wound Location: Right Malleolus - Medial Wound Open Wounding Event: Gradually Appeared Status: Date Acquired: 01/30/2016 Comorbid Cataracts, Chronic Obstructive Pulmonary Weeks Of Treatment: 118 History: Disease (COPD), Osteoarthritis Clustered Wound: Yes Photos Photo Uploaded By: Renne Crigler on 05/06/2018 10:15:45 Wound Measurements Length: (cm) 4.2 Width: (cm) 3 Depth: (cm) 0.2 Area: (cm) 9.896 Volume: (cm) 1.979 % Reduction in Area: 14.3% % Reduction in Volume: -71.3% Epithelialization: None Tunneling: No Undermining: No Wound Description Full Thickness Without Exposed Support Classification: Structures Wound Margin: Distinct, outline attached Exudate Medium Amount: Exudate Type: Serosanguineous Exudate Color: red, brown Foul Odor After Cleansing: No Slough/Fibrino Yes  Wound Bed Granulation Amount: Small (1-33%) Exposed Structure Granulation Quality: Red Fascia Exposed: No Necrotic Amount: Large (67-100%) Fat Layer (Subcutaneous Tissue) Exposed: Yes Necrotic Quality: Adherent Slough Tendon Exposed: No Muscle Exposed: No Joint Exposed: No Bone Exposed: No [name], [name] Periwound Skin Texture Texture Color No Abnormalities Noted: No No Abnormalities Noted: No Callus: No Atrophie Blanche: No Crepitus: No Cyanosis: No Excoriation: No Ecchymosis: No Induration: No Erythema: No Rash: No Hemosiderin Staining: No Scarring: No Mottled: No Pallor: No Moisture Rubor: No No Abnormalities Noted: No Dry / Scaly: No Temperature / Pain Maceration: No Temperature: No Abnormality Tenderness on Palpation: Yes Wound Preparation Ulcer Cleansing: Rinsed/Irrigated with Saline Topical Anesthetic Applied: Other: LIDOCAINE 4%, Treatment Notes Wound #2 (Right, Medial Malleolus) 1. Cleansed with: Clean wound with  Normal Saline 2. Anesthetic Topical Lidocaine 4% cream to wound bed prior to debridement 3. Peri-wound Care: Other peri-wound care (specify in notes) 4. Dressing Applied: Hydrafera Blue 5. Secondary Dressing Applied ABD Pad Kerlix/Conform Notes gentamycin cream Electronic Signature(s) Signed: 05/06/2018 4:32:14 PM By: Renne Crigler Signed: 05/06/2018 4:54:17 PM By: Curtis Sites Entered By: Curtis Sites on 05/06/2018 08:26:39 [name] ([id number]) -------------------------------------------------------------------------------- Vitals Details Patient Name: [name] Date of Service: 05/06/2018 8:00 AM Medical Record Number: [id number] Patient Account Number: [id number] Date of Birth/Sex: 1945-09-24 (72 y.o. M) Treating RN: Curtis Sites Primary Care Chirstina Haan: [name] Other Clinician: Referring Delphin Funes: [name] Treating Charrie Mcconnon/Extender: STONE III, HOYT Weeks in Treatment: 120 Vital Signs Time Taken: 08:00 Temperature (F): 98.3 Height (in): 76 Pulse (bpm): 78 Weight (lbs): 238 Respiratory Rate (breaths/min): 18 Body Mass Index (BMI): 29 Blood Pressure (mmHg): 155/68 Reference Range: 80 - 120 mg / dl Electronic Signature(s) Signed: 05/06/2018 11:45:15 AM By: Dayton Martes RCP, RRT, CHT Entered By: Weyman Rodney, Lucio Edward on 05/06/2018 08:03:59

## 2018-05-20 ENCOUNTER — Encounter: Payer: Medicare HMO | Admitting: Physician Assistant

## 2018-05-20 DIAGNOSIS — L97312 Non-pressure chronic ulcer of right ankle with fat layer exposed: Secondary | ICD-10-CM | POA: Diagnosis not present

## 2018-05-22 NOTE — Progress Notes (Signed)
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 the ointment to be used. 04/24/16; the patient's wounds on the right medial and right lateral ankle/malleolus both look a lot better today. Less adherent slough healthier tissue. Dimensions better  especially medially 05/01/16; the patient's wound surface continues to improve however he continues to require debridement switch her easier each week. Continue Santyl/Metahydrin mixture Hydrofera Blue next week. Still drainage on the medial aspect according to the intake nurse 05/08/16; still using Santyl and Medihoney. Still a lot of drainage per her intake nurse. Patient has no complaints pain fever chills etc. 05/15/16 switched the Hydrofera Blue last week. Dimensions down especially in the medial right leg wound. Area on the lateral which is more substantial also looks better still requires debridement 05/22/16; we have been using Hydrofera Blue. 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 Currently he tells me that the pain is doing very well about a 1 out of 10 today. Yesterday was a little bit worse but he tells me that he was more active watering his flowers that day. Overall he feels that his symptoms are doing significantly better at this point in time. His edema continues to be controlled well with the 4-layer compression wrap and he really has not noted any odor at this point in time. He is tolerating the dressing changes when they are performed well. 06/05/16 at this point in time today patient currently shows no interval signs or symptoms of local or systemic infection. Again his pain level he rates to be a 1 out of 10 at most and overall he tells me that generally this is not giving him much trouble. In fact he even feels maybe a little bit better than last week. We have continue with the 4-layer compression wrap in which she tolerates very well at this point. He is continuing to utilize the [place]. 06/12/16 I think there has been some progression in the status of  both of these wounds over today again covered in a gelatinous surface. Has been using Hydrofera Blue. We had used Iodoflex in the past I'm not sure if there was an issue other than changing to something that might progress towards closure faster 06/19/16; he did not tolerate the Flexeril last week secondary to pain and this was changed on Friday back to [facility] area he continues to have copious amounts of gelatinous surface slough which is think inhibiting the speed of healing this area 06/26/16 patient over the last week has utilized the Santyl to try to loosen up some of the tightly adherent slough that was noted on evaluation last week. The good news is he tells me that the medial malleoli region really does not bother him the right llateral malleoli region is more tender to palpation at this point in time especially in the central/inferior location. However it does appear that the Santyl has done his job to loosen up the adherent slough at this point in time. Fortunately he has no interval signs or symptoms of infection locally or systemically no purulent discharge noted. 07/03/16 at this point in time today patient's wounds appear to be significantly improved over the right medial and lateral malleolus locations. He has much less tenderness at this point in time and the wounds appear clean her although there is still adherent slough this is sufficiently improved over what I  saw last week. I still see no evidence of local infection. 07/10/16; continued gradual improvement in the right medial and lateral malleolus locations. The lateral is more substantial wound now divided into 2 by a rim of normal epithelialization. 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 He denies any complaints of pain or intolerance to compression. Both ulcers are smaller in size oriented today's  measurements, both are covered with a softly adherent slough. 07/24/16; medial wound is smaller, lateral about the same although surface looks better. Still using Hydrofera Blue 07/31/16; arrives today complaining of pain in the lateral part of his foot. Nurse reports a lot more drainage. He has been using Hydrofera Blue. Switch to silver alginate today 08/03/2016 -- I was asked to see the patient was here for a nurse visit today. I understand he had a lot of pain in his right lower extremity and was having blisters on his right foot which have not been there before. Though he started on doxycycline he does not have blisters elsewhere on his body. I do not believe this is a drug allergy. also mentioned that there was a copious purulent discharge from the wound and clinically there is no evidence of cellulitis. 08/07/16; I note that the patient came in for his nurse check on Friday apparently with blisters on his toes on the right than a lot of swelling in his forefoot. He continued on the doxycycline that I had prescribed on 12/8. A culture was done of the lateral wound that showed a combination of a few Proteus and Pseudomonas. Doxycycline might of covered the Proteus but would be unlikely to cover the Pseudomonas. He is on Coumadin. He arrives in the clinic today feeling a lot better states the pain is a lot better but nothing specific really was done other than to rewrap the foot also noted that he had arterial studies ordered in August although these were never done. It is reasonable to go ahead and reorder these. 08/14/16; generally arrives in a better state today in terms of the wounds he has taken cefdinir for one week. Our intake nurse reports copious amounts of drainage but the patient is complaining of much less pain. He is not had his PT and INR checked and I've asked him to do this today or tomorrow. 08/24/2016 -- patient arrives today after 10 days and said he had a stomach upset. His  arterial study was done and I have reviewed this report and find it to be within normal limits. However I did not note any venous duplex studies for reflux, and Dr. Leanord Hawking may have ordered these in the past but I will leave it to him to decide if he needs these. The patient has finished his course of cefdinir. 08/28/16; patient arrives today again with copious amounts of thick really green drainage for our intake nurse. He states he has a very tender spot at the superior part of the lateral wound. Wounds are larger 09/04/16; no real change in the condition of this patient's wound still copious amounts of surface slough. Started him on Iodoflex last week he is completing another course of Cefdinir or which I think was done empirically. His arterial study showed ABIs were 1.1 on the right 1.5 on the left. He did have a slightly reduced ABI in the right the left one was not obtained. Had calcification of the right posterior tibial artery. The  interpretation was no segmental stenosis. His waveforms were triphasic. [name], [name] ([id number]) His reflux studies are later this month. Depending on this I'll send him for a vascular consultation, he may need to see plastic surgery as I believe he is had plastic surgery on this foot in the past. He had an injury to the foot in the 1980s. 1/16 /18 right lateral greater than right medial ankle wounds on the right in the setting of previous skin grafting. 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 He does not have arterial issues. Each week he comes in with the same adherent surface slough although there was less of this today 09/18/16; right lateral greater than right medial lower extremity wounds in the setting of previous skin grafting and trauma. He has least to vein laser ablation scheduled for February 2 for venous reflux. He does not have significant arterial  disease. Problem has been very difficult to handle surface slough/necrotic tissue. Recently using Iodoflex for this with some, albeit slow improvement 09/25/16; right lateral greater than right medial lower extremity venous wounds in the setting of previous skin grafting. He is going for ablation surgery on February 2 after this he'll come back here for rewrap. He has been using Iodoflex as the primary dressing. 10/02/16; right lateral greater than right medial lower extremity wounds in the setting of previous skin grafting. He had his ablation surgery last week, I don't have a report. He tolerated this well. Came in with a thigh-high Unna boots on Friday. We have been using Iodoflex as the primary dressing. His measurements are improving 10/09/16; continues to make nice aggressive in terms of the wounds on his lateral and medial right ankle in the setting of previous skin grafting. Yesterday he noticed drainage at one of his surgical sites from his venous ablation on the right calf. He took off the bandage over this area felt a "popping" sensation and a reddish-brown drainage. He is not complaining of any pain 10/16/16; he continues to make nice progression in terms of the wounds on the lateral and medial malleolus. Both smaller using Iodoflex. He had a surgical area in his posterior mid calf we have been using iodoform. All the wounds are down and dimensions 10/23/16; the patient arrives today with no complaints. He states the Iodoflex is a bit uncomfortable. He is not systemically unwell. We have been using Iodoflex to the lateral right ankle and the medial and Aquacel Ag to the reflux surgical wound on the posterior right calf. All of these wounds are doing well 10/30/16; patient states he has no pain no systemic symptoms. I changed him to [facility] last week. Although the wounds are doing well 11/06/16; patient reports no pain or systemic symptoms. We continue with Hydrofera Blue. Both wound  areas on the medial and lateral ankle appear to be doing well with improvement and dimensions and improvement in the wound bed. 11/13/16; patient's dimensions continued to improve. We continue with Hydrofera Blue on the medial and lateral side. Appear to be doing well with healthy granulation and advancing epithelialization 11/20/16; patient's dimensions improving laterally by about half a centimeter in length. Otherwise no change on the medial side. Using [facility] 12/04/16; no major change in patient's wound dimensions. Intake nurse reports more drainage. The patient states no pain, no systemic symptoms including fever or chills 11/27/16- patient is here for follow-up evaluation of his  bimalleolar ulcers. He is voicing no complaints or concerns. He has been tolerating his twice weekly compression therapy changes 12/11/16 Patient complains of pain and increased drainage.. wants hydrofera blue 12/18/16 improvement. Sorbact 12/25/16; medial wound is smaller, lateral measures the same. Still on sorbact 01/01/17; medial wound continues to be smaller, lateral measures about the same however there is clearly advancing epithelialization here as well in fact I think the wound will ultimately divided into 2 open areas 01/08/17; unfortunately today fairly significant regression in several areas. Surface of the lateral wound covered again in adherent necrotic material which is difficult to debridement. He has significant surrounding skin maceration. The expanding area of tissue epithelialization in the middle of the wound that was encouraging last week appears to be smaller. There is no surrounding tenderness. The area on the medial leg also did not seem to be as healthy as last week, the reason for this regression this week is not totally clear. We have been using Sorbact for the last 4 weeks. We'll switched of polymen AG which we will order via home medical supply. If there is a problem with this would switch  back to Iodoflex 01/15/18; drainage,odor. No change. Switched to polymen last week 01/22/17; still continuous drainage. Culture I did last week showed a few Proteus pansensitive. I did this culture because of drainage. Put him on Augmentin which she has been taking since Saturday however he is developed 4-5 liquid bowel movements. He is also on Coumadin. Beyond this wound is not changed at all, still nonviable necrotic surface material which I debrided reveals healthy granulation line 01/29/17; still copious amounts of drainage reported by her intake nurse. Wound measuring slightly smaller. Currently fact on Iodoflex although I'm looking forward to changing back to perhaps [facility] or polymen AG 02/05/17; still large amounts of drainage and presenting with really large amounts of adherent slough and necrotic material over the remaining open area of the wound. We have been using Iodoflex but with little improvement in the surface. Change to [facility] 02/12/17; still large amount of drainage. Much less adherent slough however. Started [employer]	[place] last week [name], [name] ([id number]) 02/19/17; drainage is better this week. Much less adherent slough. Perhaps some improvement in dimensions. Using [facility] 02/26/17; severe venous insufficiency wounds on the right lateral and right medial leg. Drainage is some better and slough is less adherent we've been using Hydrofera Blue 03/05/17 on evaluation today patient appears to be doing well. His wounds have been decreasing in size and overall he is pleased with how this is progressing. We are awaiting approval for the epigraph which has previously been recommended in the meantime the [facility] Dressing is to be doing very well for him. 03/12/17; wound dimensions are smaller still using Hydrofera Blue. Comes in on Fridays for a dressing change. 03/19/17; wound dimensions continued to contract. Healing of this wound is complicated by  continuous significant drainage as well as recurrent buildup of necrotic surface material. We looked into Apligraf and he has a $290 co-pay per application but truthfully I think the drainage as well as the nonviable surface would preclude use of Apligraf there are any other skin substitute at this point therefore the continued plan will be debridement each clinic visit, 2 times a week dressing changes and continued use of Hydrofera Blue. improvement has been very slow but sustained 03/26/17 perhaps slight improvements in peripheral epithelialization is especially inferiorly. Still with large amount of drainage and tightly adherent necrotic surface on  arrival. Along with the intake nurse I reviewed previous treatment. He worsened on Iodoflex and had a 4 week trial of sorbact, Polymen AG and a long courses of Aquacel Ag. He is not a candidate for advanced treatment options for many reasons 04/09/17 on evaluation today patient's right lower extremity wounds appear to be doing a little better. Fortunately he has no significant discomfort and has been tolerating the dressing changes including the wraps without complication. With that being said he really does not have swelling anymore compared to what he has had in the past following his vascular intervention. He wonders if potentially we could attempt avoiding the rats to see if he could cleanse the wound in between to try to prevent some of the fiber and its buildup that occurs in the interim between when we see him week to week. No fevers, chills, nausea, or vomiting noted at this time. 04/16/17; noted that the staff made a choice last week not to put him in compression. The patient is changing his dressing at home using [facility] and changing every second day. His wife is washing the wound with saline. He is using kerlix. Surprisingly he has not developed a lot of edema. This choice was made because of the degree of fluid retention and maceration  even with changing his dressing twice a week 04/23/17; absolutely no change. Using Hydrofera Blue. Recurrent tightly adherent nonviable surface material. This is been refractory to Iodoflex, sorbact and now Hydrofera Blue. More recently he has been changing his own dressings at home, cleansing the wound in the shower. He has not developed lower extremity edema 04/30/17; if anything the wound is larger still in adherent surface although it debrided easily today. I've been using Mehta honey for 1 week and I'd like to try and do it for a second week this see if we can get some form of viable surface here. 05/07/17; patient arrives with copious amounts of drainage and some pain in the superior part of the wound. He has not been systemically unwell. He's been changing this daily at home 05/14/17; patient arrives today complaining of less drainage and less pain. Dimensions slightly better. Surface culture I did of this last week grew MSSA I put him on dicloxacillin for 10 days. Patient requesting a further prescription since he feels so much better. He did not obtain the [facility] for reasons that aren't clear, he has been using Hydrofera Blue 05/21/17; perhaps some less drainage and less pain. He is completing another week of doxycycline. Unfortunately there is no change in the wound measurements are appearance still tightly adherent necrotic surface material that is really defied treatment. We have been using Hydrofera Blue most recently. He did not manage to get Anasept. He was told it was prescription at [facility] 05/29/17; absolutely no improvement in these wound areas. He had remote plastic surgery with who was done at [facility] in [place] surrounding this area. This was a traumatic wound with extensive plastic surgery/skin grafting at that time. I switched him to sorbact last week to see if he can do anything about the recurrent necrotic surface and drainage. This is largely been refractory to  Iodoflex/Hydrofera Blue/alginates. I believe we tried Elby Showers and more recently Medi honey l however the drainage is really too excessive 06/04/17; patient went to see Dr. Ulice Bold. Per the patient she ordered him a compression stocking. Continued the Anasept gel and sorbact was already on. No major improvement in the condition of the wounds in fact  the medial wound looks larger. Again per the patient she did not feel a skin graft or operative debridement was indicated The patient had previous venous ablation in February 2018. Last arterial studies were in December 2017 and were within normal limits 06/18/17; patient arrives back in clinic today using Anasept gel with sorbact. He changes this every day is wearing his own compression stocking. The patient actually saw Dr. Leta Baptist of plastic surgery. I've reviewed her note. The appointment was on 05/30/17. The basic issue with here was that she did not feel that skin grafts for patients with underlying stasis and edema have a good long-term success rate. She also discuss skin substitutes which has been done here as well however I have not been able to get the surface of these wounds to something that I think would support skin substitutes. This is why he felt he might need an [name], [name]. ([id number]) operative debridement more aggressively than I can do in this clinic Review of systems; is otherwise negative he feels well 07/02/17; patient has been using Anasept gel with Sorbact. He changes this every day scrubs out the wound beds. Arrives today with the wound bed looking some better. Easier debridement 07/16/17; patient has been using Anasept gel was sorbact for about a month now. The necrotic service on his wound bed is better and the drainage is now down to minimal but we've not made any improvements in the epithelialization. Change to Santyl today. Surface of the wound is still not good enough to support skin substitutes 07/30/17 on  evaluation today patient appears to be doing very well in regard to his right bimalleolus wounds. He has been tolerating the treatment with the Anasept gel and sorbact. However we were going to try and switch to Santyl to be more aggressive with these wounds. This was however cost prohibitive. Therefore we will likely go back to the sorbact. Nonetheless he is not having any significant discomfort he still is forming slough over the wound location but nothing too significant. The wounds do appear to be filling in nicely. 08/13/17; I have not seen this wound in about a month. There is not much change. The fact the area medially looks larger. He has been using sorbact and Anasept for over a month now without much effect. Arrives in clinic stating that he does not want the area debrided 08/28/17; patient arrives with the areas on his right medial and right lateral ankle worse. The wounds of expanded there is erythema and still drainage. There is no pain and no tenderness. We had been using Keracel AG clearly not doing well. 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 He states that even the evening that we began utilizing the Iodoflex he started having burning. Subsequently this never really improved and he eventually discontinue the use of the Iodoflex altogether. Unfortunately he did not let us know about any of this until today. I'm unsure of any specific infection at this point although I am going to obtain a wound culture today in order to see if there's anything that could potentially be causing an infection as well. It does sound as if he's had the Iodoflex before and did not have this kind of reaction  although this does sound very  specific for a reaction to the Iodoflex. 09/17/17 on evaluation today patient appears to be doing significantly better compared to last week's evaluation. At that time it actually appears that he did have an infection the good news is that we have been able to get this under control with switching to the [facility] solution he's not having as much pain and discomfort he still does have some erythema surrounding the medial aspect wound. He has been tolerating the Dakin's soaked gauze dressing which is excellent and that his wounds do not seem to have as much adherent slough. Overall I'm pleased with the progress she's made in last week. 11/05/17 on evaluation today patient appears to be doing better in regard to his right medial and lateral malleolus ulcers. He has been tolerating the dressing changes without complication. I do flight the Freada Bergeron is helping with additional granulation at this point in the wound beds do appear to be a little bit better. With that being said patient does not appear to have any evidence of significant infection at this point there is no erythema as he has previously noted he does note that the 30 day course of antibiotics I placed him on will end tomorrow. 11/12/17 on evaluation today patient actually appears to be doing excellent in regard to his right medial and lateral malleolus ulcers. He has been tolerating the dressing changes without complication. Fortunately he has no evidence of infection at this time and overall I believe he is progressing extremely nicely he has a lot of new epithelialization noted on evaluation today. Patient likewise is very pleased with the progress even just since last week. 11/19/17 on evaluation today patient appears to be doing about the same in regard to his ulcerations. He does not have any additional breakdown although he really has not noted any significant improvement either over the past week. With that being said the more concerning  thing at this time is that he is beginning to show signs of erythema surrounding both wounds which is what typically happens and then he will develop into a more overt infection. This has been the course for some time. I hope that doing the 30 day in a biotic cycle this past time would break that so that he would not have the same type of thing happen again. Nonetheless it appears that is digressing back into this infected state unfortunately. With that being said he is not having any pain currently which is good he typically does start to hurt more as this progresses. Fortunately there does not appear to be any evidence of infection systemically which is good news. 11/26/17 on evaluation today patient appears to actually be doing rather well in regard to his ulcer compared to last week. He still has your theme and noted around both the medial and lateral malleolus ulcers of the right lower extremity. He does not seem to be quite as overtly infected however. He has been tolerating the dressing changes without complication which is good news. The gentamicin cream does seem to have been of benefit for him. With that being said he does actually have an appointment with Dr. Sampson Goon this morning to see if there's anything that would be recommended in the way of IV antibiotic [name], [name] therapy. Otherwise he still has slough noted on the surface of the wound. 12/03/17 on evaluation today patient presents for follow-up concerning his ongoing right lower extremity ulcers. He has been tolerating the dressing  changes without complication he states he has not really been using gentamicin on the lateral ankle and this seems to be doing very well. For that reason I think that is probably fine to put a hold on the gentamicin he states his burns and stings when he applies it and we maybe be able to just use this intermittently when things become more irritated. Other than that the good news is  I did review his MRI which was performed on 11/28/17 and revealed that he has a negative MRI for abscess, osteomyelitis, or septic joint. Obviously these were the issues that I was concerned with the possibility of and I'm pleased to find that there is no problems in that regard. I did also review Dr. Jarrett Ables note from infectious disease he discussed performed some lab work which apparently was done and then subsequently was going to consider the possibility of Keflex long-term for prevention of infection although this has not been prescribed as of yet. 12/09/17 on evaluation today patient appears to be doing a little bit worse in regard to his right medial and lateral malleolus ulcers. Fortunately the wound does not seem to be significantly worse although he does have a lot more drainage especially the lateral aspect he is having a lot more pain than what he has noted more recently. Fortunately there does not appear to be any evidence of systemic infection which is good news. Again he has seen Dr. Sampson Goon but he has not been placed on the potential Keflex which was recommended as a possibility at least yet. 12/17/17 unfortunately on evaluation today the patient's wound appears to be doing much worse. He has been performing the dressing changes as directed. Upon a review of notes in epic it does appear that Dr. Jarrett Ables office specifically sending Keflex for the patient on the 16th which was last Tuesday. With that being said the patient states that though this was sent into [facility] that [facility] stated they never received it and subsequently the patient never took anything. He tells me he has been in bed since Friday due to the increased pain and overall general malaise. No fevers, chills, nausea, or vomiting noted at this time. 02/25/18 on evaluation today patient appears to be doing rather well in regard to his bilateral right malleolus ulcers. He has been tolerating the dressing changes  without complication. Currently there does not appear to be any evidence of infection. Overall I'm very pleased with the progress that seems to been made up to this point. 03/11/18 on evaluation today patient actually appears to be doing very well in regard to his right ankle ulcers. He's been tolerating the dressing changes without complication. Fortunately there does not appear to be any evidence of sniffing infection I do believe that the gentamicin cream continues to be of benefit for him. Fortunately he is showing signs of healing in which time I see him. He also tells me he's having no pain. 03/25/18 on evaluation today patient actually appears to show signs of improvement especially in regard to the right lateral ankle ulcer. He has been tolerating the dressing changes without complication. In general I'm very pleased with the progress that has been made up to this point. 04/08/18- He presents in follow up evaluation for right bimalleolar wounds; there is essentially no change. He admits to removing the dressing at night to allow the wounds to be "air out". We will continue with same treatment plan and he will follow up in two weeks 04/22/18 on  evaluation today patient seems very frustrated with this situation currently as far as the ulcer on his right medial and lateral malleolus is concerned. He states this has been going on a very long time and obviously he does have a lot of bills as well is far as wound care is concerned. With that being said he tells me that he's been tolerating the dressing changes well although he did clarify that what he is doing is putting the medicine on in the morning after shower and then subsequently he takes the dressing off at night leaving it open to air. I previously asked him about this and I was not aware that he was doing this. Nonetheless that may be slowing things down a bit at this point. Fortunately there does not appear to be any evidence of severe  infection although he does have some evidence of your theme is surrounding the wound bed. He is still using the gentamicin cream. 05/06/18 on evaluation today patient actually presents for evaluation of his right medial malleolus ulcers. He's been tolerating the dressing changes without complication. With that being said it does appear that he is doing better compared to last evaluation. He's not having the pain like he did previous. He also states that blisters that he had coming up when I saw him last did resolve and ended up doing okay he did go to the ER they gave him some cream to put on it he's not sure what the name of the cream was. He was down in [facility] when he called me and I spoke with him previous. Nonetheless he states that they told him to put the medicine on his our due list. With that being said I'm not really 100% sure that the medicine had anything to do with this due to the fact that again he was having the blisters when he came into the office which is why we actually put them on the anabiotic I was concerned about him having an infection which was causing the blistering and increased pain in his extremity. Nonetheless this is something that we can definitely be cautious of in the future it was Bactrim [name], [name] that we had him on. Overall I'm glad he is doing better. 05/20/18 on evaluation today patient actually appears to be doing fairly well in regard to his ulcer on the right lateral and medial malleolus locations. Both seem to be doing decently well he does have slough building up on the surface of the wound. He has been using the Hibiclense as I instructed him. Overall there's no evidence of significant infection which is good news. Patient History Information obtained from Patient. Social History Former smoker - 10 years ago, Marital Status - Married, Alcohol Use - Moderate, Drug Use - No History, Caffeine Use - Never. Medical And Surgical  History Notes Constitutional Symptoms (General Health) Vein Filter (groin area); CODA; H/O Blood Clots; pulmonary hypertensive arterial disease Review of Systems (ROS) Constitutional Symptoms (General Health) Denies complaints or symptoms of Fever, Chills. Respiratory The patient has no complaints or symptoms. Cardiovascular The patient has no complaints or symptoms. Psychiatric The patient has no complaints or symptoms. Objective Constitutional Well-nourished and well-hydrated in no acute distress. Vitals Time Taken: 8:05 AM, Height: 76 in, Weight: 238 lbs, BMI: 29, Temperature: 98.0 F, Pulse: 71 bpm, Respiratory Rate: 18 breaths/min, Blood Pressure: 145/74 mmHg. Respiratory normal breathing without difficulty. Psychiatric this patient is able to make decisions and demonstrates good insight into disease  process. Alert and Oriented x 3. pleasant and cooperative. General Notes: Patient's wounds did both require sharp debridement today. The patient tolerated this without complication and post debridement the wound beds appear to be doing better. With that being said I did question whether or not something that such as Apligraf would be potentially of benefit for the patient. Upon discussion it looks like we did check this once before in 2008 although he had a $300 co-pay which he was not able to do at that point. Nonetheless this may be something that would be of great benefit for him in my pinion and I'm wondering if we should we check into this again. KAISER, [name] ([id number]) Integumentary (Hair, Skin) Wound #1 status is Open. Original cause of wound was Gradually Appeared. The wound is located on the Right,Lateral Malleolus. The wound measures 4.5cm length x 6cm width x 0.2cm depth; 21.206cm^2 area and 4.241cm^3 volume. There is Fat Layer (Subcutaneous Tissue) Exposed exposed. There is no tunneling or undermining noted. There is a medium amount of serous drainage noted. The  wound margin is distinct with the outline attached to the wound base. There is small (1-33%) pink granulation within the wound bed. There is a large (67-100%) amount of necrotic tissue within the wound bed including Eschar and Adherent Slough. The periwound skin appearance exhibited: Scarring. The periwound skin appearance did not exhibit: Callus, Crepitus, Excoriation, Induration, Rash, Dry/Scaly, Maceration, Atrophie Blanche, Cyanosis, Ecchymosis, Hemosiderin Staining, Mottled, Pallor, Rubor, Erythema. Periwound temperature was noted as No Abnormality. The periwound has tenderness on palpation. Wound #2 status is Open. Original cause of wound was Gradually Appeared. The wound is located on the Right,Medial Malleolus. The wound measures 4.2cm length x 2.8cm width x 0.2cm depth; 9.236cm^2 area and 1.847cm^3 volume. There is Fat Layer (Subcutaneous Tissue) Exposed exposed. There is no tunneling or undermining noted. There is a medium amount of serous drainage noted. The wound margin is distinct with the outline attached to the wound base. There is small (1-33%) red granulation within the wound bed. There is a large (67-100%) amount of necrotic tissue within the wound bed including Adherent Slough. The periwound skin appearance did not exhibit: Callus, Crepitus, Excoriation, Induration, Rash, Scarring, Dry/Scaly, Maceration, Atrophie Blanche, Cyanosis, Ecchymosis, Hemosiderin Staining, Mottled, Pallor, Rubor, Erythema. Periwound temperature was noted as No Abnormality. The periwound has tenderness on palpation. 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 There was a Excisional Skin/Subcutaneous Tissue Debridement with a total area of 27 sq cm performed by STONE III, Drago Hammonds E., PA-C. With the following instrument(s): Curette to remove Viable and Non-Viable tissue/material. Material removed includes Subcutaneous Tissue and Slough and after achieving pain control using Lidocaine 4% Topical Solution. No specimens were taken. A time out was conducted at 08:21, prior to the start of the procedure. A Minimum amount of bleeding was controlled with Pressure. The procedure was tolerated well with a pain level of 0 throughout and a pain level of 0 following the procedure. Post Debridement Measurements: 4.5cm length x 6cm width x 0.3cm depth; 6.362cm^3 volume. Character of Wound/Ulcer Post Debridement is improved. Severity of Tissue Post  Debridement is: Fat layer exposed. 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 There was a Excisional Skin/Subcutaneous Tissue Debridement with a total area of 11.76 sq cm performed by STONE III, Maren Wiesen E., PA-C. With the following instrument(s): Curette to remove Viable and Non-Viable tissue/material. Material removed includes Subcutaneous Tissue and Slough and after achieving pain control using Lidocaine Rubis, Jamie C. ([id number]) 4% Topical Solution. No specimens were taken. A time out was conducted at 08:24, prior to the start of the procedure. A Minimum amount of bleeding was controlled with Pressure. The procedure was tolerated well with a pain level of 0 throughout and a pain level of 0 following the procedure. Post Debridement Measurements: 4.2cm length x 2.8cm width x 0.3cm  depth; 2.771cm^3 volume. Character of Wound/Ulcer Post Debridement is improved. Severity of Tissue Post Debridement is: Fat layer exposed. Post procedure Diagnosis Wound #2: Same as Pre-Procedure Plan Wound Cleansing: Wound #1 Right,Lateral Malleolus: Clean wound with Normal Saline. May Shower, gently pat wound dry prior to applying new dressing. - wash with hibiclens daily and leave on 5 mins Wound #2 Right,Medial Malleolus: Clean wound with Normal Saline. May Shower, gently pat wound dry prior to applying new dressing. - wash with hibiclens and leave on 5 mins Anesthetic (add to Medication List): Wound #1 Right,Lateral Malleolus: Topical Lidocaine 4% cream applied to wound bed prior to debridement (In Clinic Only). Wound #2 Right,Medial Malleolus: Topical Lidocaine 4% cream applied to wound bed prior to debridement (In Clinic Only). Primary Wound Dressing: Wound #1 Right,Lateral Malleolus: Gentamicin Sulfate Cream - only on wound bed Hydrafera Blue Ready Transfer - use hydrafera blue classic in the home and moisten with saline Wound #2 Right,Medial Malleolus: Gentamicin Sulfate Cream - only on wound bed Hydrafera Blue Ready Transfer - use hydrafera blue classic in the home and moisten with saline Secondary Dressing: Wound #1 Right,Lateral Malleolus: Dry Gauze Conform/Kerlix Wound #2 Right,Medial Malleolus: Dry Gauze Conform/Kerlix Dressing Change Frequency: Wound #1 Right,Lateral Malleolus: Change dressing every other day. Wound #2 Right,Medial Malleolus: Change dressing every other day. Follow-up Appointments: Wound #1 Right,Lateral Malleolus: Return Appointment in 2 weeks. Wound #2 Right,Medial Malleolus: Return Appointment in 2 weeks. Edema Control: Wound #1 Right,Lateral Malleolus: Elevate legs to the level of the heart and pump ankles as often as possible Wound #2 Right,Medial Malleolus: Elevate legs to the level of the heart and pump ankles as often as  possible Additional Orders / Instructions: Wound #1 Right,Lateral Malleolus: Increase protein intake. [name], [name] ([id number]) Wound #2 Right,Medial Malleolus: Increase protein intake. We will subsequently see the patient back for reevaluation to see were things stand in two weeks time. If anything changes the meantime he'll let me know. We will in the interim see if we can get approval for Apligraf to see if that will be of benefit for him as well the patient is in agreement with that plan. If anything changes he'll let me know. Please see above for specific wound care orders. We will see patient for re-evaluation in 2 week(s) here in the clinic. If anything worsens or changes patient will contact our office for additional recommendations. Electronic Signature(s) Signed: 05/20/2018 5:36:35 PM By: Lenda Kelp PA-C Entered By: Lenda Kelp on 05/20/2018 09:06:00 [name] ([id number]) -------------------------------------------------------------------------------- ROS/PFSH Details Patient Name: [name] Date of Service: 05/20/2018 8:00 AM Medical  Record Number: [id number] Patient Account Number: [id number] Date of Birth/Sex: June 16, 1946 (72 y.o. M) Treating RN: Curtis Sites Primary Care Provider: [name] Other Clinician: Referring Provider: [name] Treating Provider/Extender: STONE III, Filimon Miranda Weeks in Treatment: 122 Information Obtained From Patient Wound History Do you currently have one or more open woundso Yes How many open wounds do you currently haveo 1 Approximately how long have you had your woundso 5 months How have you been treating your wound(s) until nowo saline, dressing Has your wound(s) ever healed and then re-openedo Yes Have you had any lab work done in the past montho No Have you tested positive for an antibiotic resistant organism (MRSA, VRE)o No Have you tested positive for osteomyelitis (bone infection)o No Have you  had any tests for circulation on your legso No Constitutional Symptoms (General Health) Complaints and Symptoms: Negative for: Fever; Chills Medical History: Past Medical History Notes: Vein Filter (groin area); CODA; H/O Blood Clots; pulmonary hypertensive arterial disease Eyes Medical History: Positive for: Cataracts - removed Negative for: Glaucoma; Optic Neuritis Ear/Nose/Mouth/Throat Medical History: Negative for: Chronic sinus problems/congestion Hematologic/Lymphatic Medical History: Negative for: Anemia; Hemophilia; Human Immunodeficiency Virus; Lymphedema; Sickle Cell Disease Respiratory Complaints and Symptoms: No Complaints or Symptoms Medical History: Positive for: Chronic Obstructive Pulmonary Disease (COPD) Negative for: Aspiration; Asthma; Pneumothorax; Sleep Apnea; Tuberculosis Cardiovascular [name], HUBBERT. ([id number]) Complaints and Symptoms: No Complaints or Symptoms Medical History: Negative for: Angina; Arrhythmia; Congestive Heart Failure; Coronary Artery Disease; Deep Vein Thrombosis; Hypertension; Hypotension; Myocardial Infarction; Peripheral Arterial Disease; Peripheral Venous Disease; Phlebitis; Vasculitis Gastrointestinal Medical History: Negative for: Cirrhosis ; Colitis; Crohnos; Hepatitis A; Hepatitis B; Hepatitis C Endocrine Medical History: Negative for: Type I Diabetes; Type II Diabetes Genitourinary Medical History: Negative for: End Stage Renal Disease Immunological Medical History: Negative for: Lupus Erythematosus; Raynaudos Integumentary (Skin) Medical History: Negative for: History of Burn; History of pressure wounds Musculoskeletal Medical History: Positive for: Osteoarthritis Negative for: Gout; Rheumatoid Arthritis; Osteomyelitis Neurologic Medical History: Negative for: Dementia; Neuropathy; Quadriplegia; Paraplegia; Seizure Disorder Oncologic Medical History: Negative for: Received Chemotherapy; Received  Radiation Psychiatric Complaints and Symptoms: No Complaints or Symptoms Medical History: Negative for: Anorexia/bulimia; Confinement Anxiety HBO Extended History Items KYMONI, MONDAY. ([id number]) Eyes: Cataracts Immunizations Pneumococcal Vaccine: Received Pneumococcal Vaccination: No Implantable Devices Family and Social History Former smoker - 10 years ago; Marital Status - Married; Alcohol Use: Moderate; Drug Use: No History; Caffeine Use: Never; Advanced Directives: No; Patient does not want information on Advanced Directives; Living Will: No; Medical Power of Attorney: No Physician Affirmation I have reviewed and agree with the above information. Electronic Signature(s) Signed: 05/20/2018 5:36:35 PM By: Lenda Kelp PA-C Signed: 05/21/2018 5:04:30 PM By: Curtis Sites Entered By: Lenda Kelp on 05/20/2018 09:05:02 [name] ([id number]) -------------------------------------------------------------------------------- SuperBill Details Patient Name: [name] Date of Service: 05/20/2018 Medical Record Number: [id number] Patient Account Number: [id number] Date of Birth/Sex: 1946/05/15 (72 y.o. 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, sequela T81.31XS Disruption of external operation (surgical) wound, not elsewhere classified, sequela Facility Procedures CPT4 Code: 24401027 Description: 11042 - DEB SUBQ TISSUE 20 SQ CM/< ICD-10 Diagnosis Description L97.312 Non-pressure chronic ulcer of right  ankle with fat layer ex Modifier: posed Quantity: 1 CPT4 Code: 16109604 Description: 11045 - DEB SUBQ TISS EA ADDL 20CM ICD-10 Diagnosis Description L97.312 Non-pressure chronic ulcer of right ankle with fat layer ex Modifier: posed Quantity: 1 Physician Procedures CPT4 Code: 5409811 Description: 11042 - WC PHYS SUBQ TISS 20 SQ CM ICD-10 Diagnosis Description L97.312 Non-pressure chronic ulcer of right ankle with fat layer exp Modifier: osed Quantity: 1 CPT4 Code: 9147829 Description: 11045 - WC PHYS SUBQ TISS EA ADDL 20 CM ICD-10 Diagnosis Description L97.312 Non-pressure chronic ulcer of right ankle with fat layer exp Modifier: osed Quantity: 1 Electronic Signature(s) Signed: 05/20/2018 5:36:35 PM By: Lenda Kelp PA-C Entered By: Lenda Kelp on 05/20/2018 56:21:30

## 2018-05-22 NOTE — Progress Notes (Signed)
[name], [name] ([id number]) Visit Report for 05/20/2018 Arrival Information Details Patient Name: [name], [name]. Date of Service: 05/20/2018 8:00 AM Medical Record Number: [id number] Patient Account Number: [id number] Date of Birth/Sex: Dec 03, 1945 (72 y.o. M) Treating RN: Renne Crigler Primary Care Marsella Suman: [name] Other Clinician: Referring Bari Handshoe: [name] Treating Malacki Mcphearson/Extender: Linwood Dibbles, HOYT Weeks in Treatment: 122 Visit Information History Since Last Visit All ordered tests and consults were completed: No Patient Arrived: Ambulatory Added or deleted any medications: No Arrival Time: 08:03 Any new allergies or adverse reactions: No Accompanied By: self Had a fall or experienced change in No Transfer Assistance: None activities of daily living that may affect Patient Identification Verified: Yes risk of falls: Secondary Verification Process Yes Signs or symptoms of abuse/neglect since last visito No Completed: Hospitalized since last visit: No Patient Requires Transmission-Based No Implantable device outside of the clinic excluding No Precautions: cellular tissue based products placed in the center Patient Has Alerts: Yes since last visit: Patient Alerts: Patient on Blood Pain Present Now: No Thinner Electronic Signature(s) Signed: 05/20/2018 10:33:34 AM By: Renne Crigler Entered By: Renne Crigler on 05/20/2018 08:03:49 [name] ([id number]) -------------------------------------------------------------------------------- Encounter Discharge Information Details Patient Name: [name] Date of Service: 05/20/2018 8:00 AM Medical Record Number: [id number] Patient Account Number: [id number] Date of Birth/Sex: June 21, 1946 (72 y.o. M) Treating RN: Huel Coventry Primary Care Stephnie Parlier: [name] Other Clinician: Referring Treyvon Blahut: [name] Treating Jyll Tomaro/Extender: Linwood Dibbles, HOYT Weeks in Treatment:  122 Encounter Discharge Information Items Discharge Condition: Stable Ambulatory Status: Ambulatory Discharge Destination: Home Transportation: Other Accompanied By: self Schedule Follow-up Appointment: Yes Clinical Summary of Care: Post Procedure Vitals: Temperature (F): 98 Pulse (bpm): 71 Respiratory Rate (breaths/min): 18 Blood Pressure (mmHg): 145/74 Electronic Signature(s) Signed: 05/20/2018 9:55:53 AM By: Elliot Gurney, BSN, RN, CWS, Kim RN, BSN Entered By: Elliot Gurney, BSN, RN, CWS, Kim on 05/20/2018 09:55:53 [name] ([id number]) -------------------------------------------------------------------------------- Lower Extremity Assessment Details Patient Name: [name] Date of Service: 05/20/2018 8:00 AM Medical Record Number: [id number] Patient Account Number: [id number] Date of Birth/Sex: 1945-10-14 (72 y.o. M) Treating RN: Renne Crigler Primary Care Doreene Forrey: [name] Other Clinician: Referring Autumm Hattery: [name] Treating Jaymien Landin/Extender: STONE III, HOYT Weeks in Treatment: 122 Edema Assessment Assessed: [Left: No] [Right: No] Edema: [Left: Ye] [Right: s] Calf Left: Right: Point of Measurement: 40 cm From Medial Instep cm 34 cm Ankle Left: Right: Point of Measurement: 16 cm From Medial Instep cm 21.4 cm Vascular Assessment Claudication: Claudication Assessment [Right:None] Pulses: Dorsalis Pedis Palpable: [Right:Yes] Posterior Tibial Extremity colors, hair growth, and conditions: Extremity Color: [Right:Normal] Hair Growth on Extremity: [Right:No] Temperature of Extremity: [Right:Warm] Capillary Refill: [Right:< 3 seconds] Toe Nail Assessment Left: Right: Thick: Yes Discolored: Yes Deformed: Yes Improper Length and Hygiene: No Electronic Signature(s) Signed: 05/20/2018 10:33:34 AM By: Renne Crigler Entered By: Renne Crigler on 05/20/2018 08:10:55 [name]  ([id number]) -------------------------------------------------------------------------------- Multi Wound Chart Details Patient Name: [name] Date of Service: 05/20/2018 8:00 AM Medical Record Number: [id number] Patient Account Number: [id number] Date of Birth/Sex: 04-17-1946 (72 y.o. M) Treating RN: Curtis Sites Primary Care Jathen Sudano: [name] Other Clinician: Referring Devon Kingdon: [name] Treating Rock Sobol/Extender: STONE III, HOYT Weeks in Treatment: 122 Vital Signs Height(in): 76 Pulse(bpm): 71 Weight(lbs): 238 Blood Pressure(mmHg): 145/74 Body Mass Index(BMI): 29 Temperature(F): 98.0 Respiratory Rate 18 (breaths/min): Photos: [1:No Photos] [2:No Photos] [N/A:N/A] Wound Location: [1:Right Malleolus - Lateral] [2:Right Malleolus - Medial] [N/A:N/A] Wounding Event: [1:Gradually Appeared] [2:Gradually Appeared] [N/A:N/A] Primary Etiology: [1:Venous Leg Ulcer] [2:Venous  Leg Ulcer] [N/A:N/A] Comorbid History: [1:Cataracts, Chronic Obstructive Cataracts, Chronic Obstructive N/A Pulmonary Disease (COPD), Pulmonary Disease (COPD), Osteoarthritis] [2:Osteoarthritis] Date Acquired: [1:08/11/2015] [2:01/30/2016] [N/A:N/A] Weeks of Treatment: [1:122] [2:120] [N/A:N/A] Wound Status: [1:Open] [2:Open] [N/A:N/A] Clustered Wound: [1:No] [2:Yes] [N/A:N/A] Measurements L x W x D [1:4.5x6x0.2] [2:4.2x2.8x0.2] [N/A:N/A] (cm) Area (cm) : [1:21.206] [2:9.236] [N/A:N/A] Volume (cm) : [1:4.241] [2:1.847] [N/A:N/A] % Reduction in Area: [1:-22.70%] [2:20.00%] [N/A:N/A] % Reduction in Volume: [1:18.20%] [2:-59.90%] [N/A:N/A] Classification: [1:Full Thickness Without Exposed Support Structures] [2:Full Thickness Without Exposed Support Structures] [N/A:N/A] Exudate Amount: [1:Medium] [2:Medium] [N/A:N/A] Exudate Type: [1:Serous] [2:Serous] [N/A:N/A] Exudate Color: [1:amber] [2:amber] [N/A:N/A] Wound Margin: [1:Distinct, outline attached] [2:Distinct, outline attached]  [N/A:N/A] Granulation Amount: [1:Small (1-33%)] [2:Small (1-33%)] [N/A:N/A] Granulation Quality: [1:Pink] [2:Red] [N/A:N/A] Necrotic Amount: [1:Large (67-100%)] [2:Large (67-100%)] [N/A:N/A] Necrotic Tissue: [1:Eschar, Adherent Slough] [2:Adherent Slough] [N/A:N/A] Exposed Structures: [1:Fat Layer (Subcutaneous Tissue) Exposed: Yes Fascia: No Tendon: No Muscle: No Joint: No Bone: No] [2:Fat Layer (Subcutaneous Tissue) Exposed: Yes Fascia: No Tendon: No Muscle: No Joint: No Bone: No] [N/A:N/A] Epithelialization: [1:None] [2:None] [N/A:N/A] Periwound Skin Texture: [1:Scarring: Yes Excoriation: No] [2:Excoriation: No Induration: No] [N/A:N/A] Induration: No Callus: No Callus: No Crepitus: No Crepitus: No Rash: No Rash: No Scarring: No Periwound Skin Moisture: Maceration: No Maceration: No N/A Dry/Scaly: No Dry/Scaly: No Periwound Skin Color: Atrophie Blanche: No Atrophie Blanche: No N/A Cyanosis: No Cyanosis: No Ecchymosis: No Ecchymosis: No Erythema: No Erythema: No Hemosiderin Staining: No Hemosiderin Staining: No Mottled: No Mottled: No Pallor: No Pallor: No Rubor: No Rubor: No Temperature: No Abnormality No Abnormality N/A Tenderness on Palpation: Yes Yes N/A Wound Preparation: Ulcer Cleansing: Ulcer Cleansing: N/A Rinsed/Irrigated with Saline Rinsed/Irrigated with Saline Topical Anesthetic Applied: Topical Anesthetic Applied: Other: lidocaine 4% Other: LIDOCAINE 4% Treatment Notes Electronic Signature(s) Signed: 05/21/2018 5:04:30 PM By: Curtis Sites Entered By: Curtis Sites on 05/20/2018 08:20:36 [name] ([id number]) -------------------------------------------------------------------------------- Multi-Disciplinary Care Plan Details Patient Name: [name] Date of Service: 05/20/2018 8:00 AM Medical Record Number: [id number] Patient Account Number: [id number] Date of Birth/Sex: 05/27/1946 (72 y.o. M) Treating RN: Curtis Sites Primary Care Autumn Gunn: [name] Other Clinician: Referring Darelle Kings: [name] Treating Shanay Woolman/Extender: STONE III, HOYT Weeks in Treatment: 122 Active Inactive ` Venous Leg Ulcer Nursing Diagnoses: Knowledge deficit related to disease process and management Goals: Patient will maintain optimal edema control Date Initiated: 04/03/2016 Target Resolution Date: 11/24/2016 Goal Status: Active Interventions: Compression as ordered Treatment Activities: Therapeutic compression applied : 04/03/2016 Notes: ` Wound/Skin Impairment Nursing Diagnoses: Impaired tissue integrity Goals: Ulcer/skin breakdown will heal within 14 weeks Date Initiated: 01/17/2016 Target Resolution Date: 11/24/2016 Goal Status: Active Interventions: Assess ulceration(s) every visit Notes: Electronic Signature(s) Signed: 05/21/2018 5:04:30 PM By: Curtis Sites Entered By: Curtis Sites on 05/20/2018 08:20:27 [name] ([id number]) -------------------------------------------------------------------------------- Pain Assessment Details Patient Name: [name] Date of Service: 05/20/2018 8:00 AM Medical Record Number: [id number] Patient Account Number: [id number] Date of Birth/Sex: 08/24/1946 (72 y.o. M) Treating RN: Renne Crigler Primary Care Kimley Apsey: [name] Other Clinician: Referring Nicoles Sedlacek: [name] Treating Zayn Selley/Extender: STONE III, HOYT Weeks in Treatment: 122 Active Problems Location of Pain Severity and Description of Pain Patient Has Paino No Site Locations Pain Management and Medication Current Pain Management: Electronic Signature(s) Signed: 05/20/2018 10:33:34 AM By: Renne Crigler Entered By: Renne Crigler on 05/20/2018 08:04:39 [name] ([id number]) -------------------------------------------------------------------------------- Patient/Caregiver Education Details Patient Name: [name] Date of Service:  05/20/2018 8:00 AM Medical Record Number: [id number] Patient Account Number: [id number] Date of Birth/Gender: [birth year] (72  y.o. M) Treating RN: Huel Coventry Primary Care Physician: [name] Other Clinician: Referring Physician: [name] Treating Physician/Extender: Skeet Simmer in Treatment: 122 Education Assessment Education Provided To: Patient Education Topics Provided Venous: Handouts: Controlling Swelling with Multilayered Compression Wraps Methods: Demonstration, Explain/Verbal Responses: State content correctly [facility]: Wound/Skin Impairment: Handouts: Caring for Your Ulcer Methods: Demonstration, Explain/Verbal Responses: State content correctly Electronic Signature(s) Signed: 05/20/2018 4:44:51 PM By: Elliot Gurney, BSN, RN, CWS, Kim RN, BSN Entered By: Elliot Gurney, BSN, RN, CWS, Kim on 05/20/2018 09:56:32 [name] ([id number]) -------------------------------------------------------------------------------- Wound Assessment Details Patient Name: [name] Date of Service: 05/20/2018 8:00 AM Medical Record Number: [id number] Patient Account Number: [id number] Date of Birth/Sex: Mar 15, 1946 (72 y.o. M) Treating RN: Renne Crigler Primary Care Kimber Fritts: [name] Other Clinician: Referring Giovanna Kemmerer: [name] Treating Henrique Parekh/Extender: STONE III, HOYT Weeks in Treatment: 122 Wound Status Wound Number: 1 Primary Venous Leg Ulcer Etiology: Wound Location: Right Malleolus - Lateral Wound Open Wounding Event: Gradually Appeared Status: Date Acquired: 08/11/2015 Comorbid Cataracts, Chronic Obstructive Pulmonary Weeks Of Treatment: 122 History: Disease (COPD), Osteoarthritis Clustered Wound: No Photos Photo Uploaded By: Renne Crigler on 05/20/2018 13:55:00 Wound Measurements Length: (cm) 4.5 Width: (cm) 6 Depth: (cm) 0.2 Area: (cm) 21.206 Volume: (cm) 4.241 % Reduction in Area: -22.7% % Reduction in  Volume: 18.2% Epithelialization: None Tunneling: No Undermining: No Wound Description Full Thickness Without Exposed Support Classification: Structures Wound Margin: Distinct, outline attached Exudate Medium Amount: Exudate Type: Serous Exudate Color: amber Foul Odor After Cleansing: No Slough/Fibrino Yes Wound Bed Granulation Amount: Small (1-33%) Exposed Structure Granulation Quality: Pink Fascia Exposed: No Necrotic Amount: Large (67-100%) Fat Layer (Subcutaneous Tissue) Exposed: Yes Necrotic Quality: Eschar, Adherent Slough Tendon Exposed: No Muscle Exposed: No Joint Exposed: No Bone Exposed: No Benavidez, Laura C. ([id number]) Periwound Skin Texture Texture Color No Abnormalities Noted: No No Abnormalities Noted: No Callus: No Atrophie Blanche: No Crepitus: No Cyanosis: No Excoriation: No Ecchymosis: No Induration: No Erythema: No Rash: No Hemosiderin Staining: No Scarring: Yes Mottled: No Pallor: No Moisture Rubor: No No Abnormalities Noted: No Dry / Scaly: No Temperature / Pain Maceration: No Temperature: No Abnormality Tenderness on Palpation: Yes Wound Preparation Ulcer Cleansing: Rinsed/Irrigated with Saline Topical Anesthetic Applied: Other: lidocaine 4%, Treatment Notes Wound #1 (Right, Lateral Malleolus) 3. Peri-wound Care: Other peri-wound care (specify in notes) 4. Dressing Applied: Hydrafera Blue 5. Secondary Dressing Applied ABD Pad Notes Gentamycin, H blue, abd, conform and tape Electronic Signature(s) Signed: 05/20/2018 10:33:34 AM By: Renne Crigler Entered By: Renne Crigler on 05/20/2018 08:14:46 [name] ([id number]) -------------------------------------------------------------------------------- Wound Assessment Details Patient Name: [name] Date of Service: 05/20/2018 8:00 AM Medical Record Number: [id number] Patient Account Number: [id number] Date of Birth/Sex: 1946-03-03 (72 y.o. M) Treating  RN: Renne Crigler Primary Care Mariadelosang Wynns: [name] Other Clinician: Referring Felina Tello: [name] Treating Kilan Banfill/Extender: STONE III, HOYT Weeks in Treatment: 122 Wound Status Wound Number: 2 Primary Venous Leg Ulcer Etiology: Wound Location: Right Malleolus - Medial Wound Open Wounding Event: Gradually Appeared Status: Date Acquired: 01/30/2016 Comorbid Cataracts, Chronic Obstructive Pulmonary Weeks Of Treatment: 120 History: Disease (COPD), Osteoarthritis Clustered Wound: Yes Photos Photo Uploaded By: Renne Crigler on 05/20/2018 13:55:01 Wound Measurements Length: (cm) 4.2 Width: (cm) 2.8 Depth: (cm) 0.2 Area: (cm) 9.236 Volume: (cm) 1.847 % Reduction in Area: 20% % Reduction in Volume: -59.9% Epithelialization: None Tunneling: No Undermining: No Wound Description Full Thickness Without Exposed Support Classification: Structures Wound Margin: Distinct, outline attached Exudate Medium Amount:  Exudate Type: Serous Exudate Color: amber Foul Odor After Cleansing: No Slough/Fibrino Yes Wound Bed Granulation Amount: Small (1-33%) Exposed Structure Granulation Quality: Red Fascia Exposed: No Necrotic Amount: Large (67-100%) Fat Layer (Subcutaneous Tissue) Exposed: Yes Necrotic Quality: Adherent Slough Tendon Exposed: No Muscle Exposed: No Joint Exposed: No Bone Exposed: No Alcantar, Dwon C. ([id number]) Periwound Skin Texture Texture Color No Abnormalities Noted: No No Abnormalities Noted: No Callus: No Atrophie Blanche: No Crepitus: No Cyanosis: No Excoriation: No Ecchymosis: No Induration: No Erythema: No Rash: No Hemosiderin Staining: No Scarring: No Mottled: No Pallor: No Moisture Rubor: No No Abnormalities Noted: No Dry / Scaly: No Temperature / Pain Maceration: No Temperature: No Abnormality Tenderness on Palpation: Yes Wound Preparation Ulcer Cleansing: Rinsed/Irrigated with Saline Topical Anesthetic Applied: Other:  LIDOCAINE 4%, Treatment Notes Wound #2 (Right, Medial Malleolus) 3. Peri-wound Care: Other peri-wound care (specify in notes) 4. Dressing Applied: Hydrafera Blue 5. Secondary Dressing Applied ABD Pad Notes Gentamycin, H blue, abd, conform and tape Electronic Signature(s) Signed: 05/20/2018 10:33:34 AM By: Renne Crigler Entered By: Renne Crigler on 05/20/2018 08:15:06 [name] ([id number]) -------------------------------------------------------------------------------- Vitals Details Patient Name: [name] Date of Service: 05/20/2018 8:00 AM Medical Record Number: [id number] Patient Account Number: [id number] Date of Birth/Sex: December 10, 1945 (72 y.o. M) Treating RN: Renne Crigler Primary Care Collie Wernick: [name] Other Clinician: Referring Octave Montrose: [name] Treating Annya Lizana/Extender: STONE III, HOYT Weeks in Treatment: 122 Vital Signs Time Taken: 08:05 Temperature (F): 98.0 Height (in): 76 Pulse (bpm): 71 Weight (lbs): 238 Respiratory Rate (breaths/min): 18 Body Mass Index (BMI): 29 Blood Pressure (mmHg): 145/74 Reference Range: 80 - 120 mg / dl Electronic Signature(s) Signed: 05/20/2018 10:33:34 AM By: Renne Crigler Entered By: Renne Crigler on 05/20/2018 08:05:05

## 2018-05-29 IMAGING — MR MR ANKLE*R* W/O CM
6 series · 40 of 40 positions shown · non-contrast
Comparison: Plain films right ankle 05/07/2017.

CLINICAL DATA: Skin wounds about the right ankle for 2 years which
have increased in size and are now draining.

EXAM:
MRI OF THE RIGHT ANKLE WITHOUT CONTRAST
TECHNIQUE: Multiplanar, multisequence MR imaging of the ankle was performed. No
intravenous contrast was administered.

[Series 3: PD fat-sat · axial · 3.0mm · 0.56mm/px · z∈[-86,+90]mm · 9 of 50 slices shown]
[im 1/50]
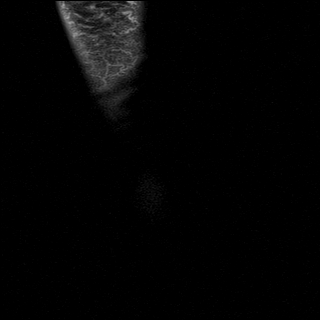
[im 7/50]
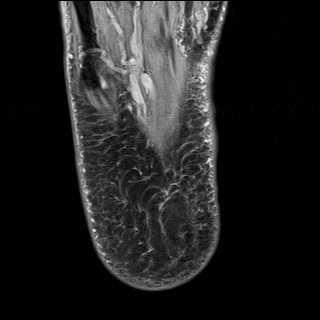
[im 13/50]
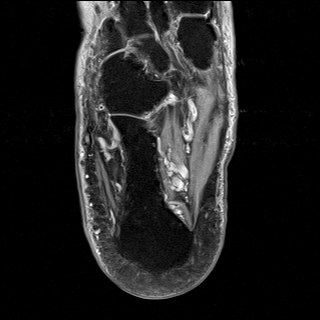
[im 19/50]
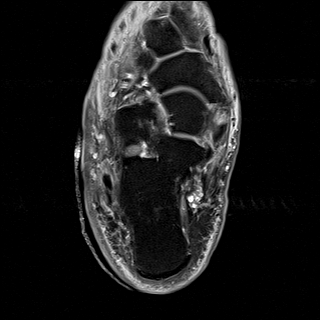
[im 25/50]
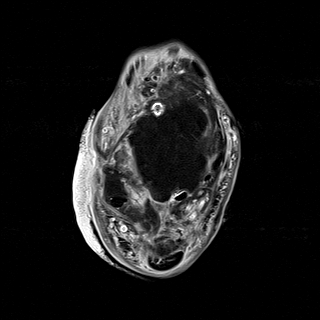
[im 31/50]
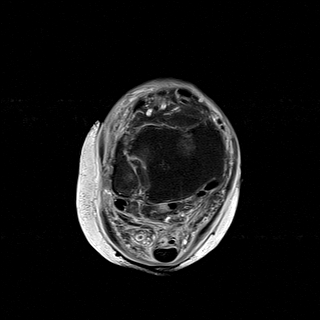
[im 37/50]
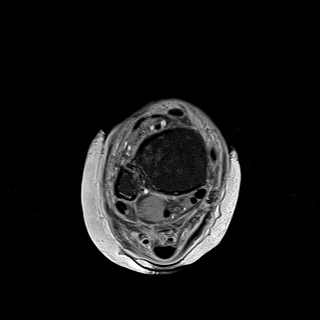
[im 43/50]
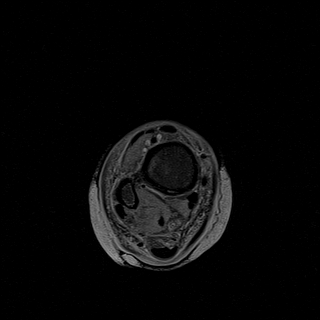
[im 50/50]
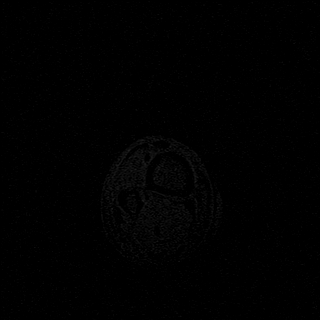

[Series 4: T2 fat-sat · axial · 3.0mm · 0.56mm/px · z∈[-86,+90]mm · 8 of 50 slices shown (1 of 3)]
[im 1/50]
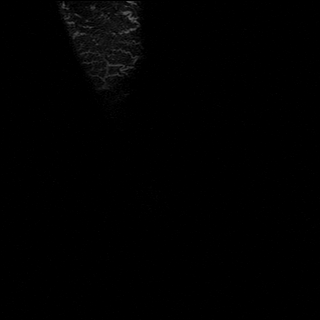
[im 8/50]
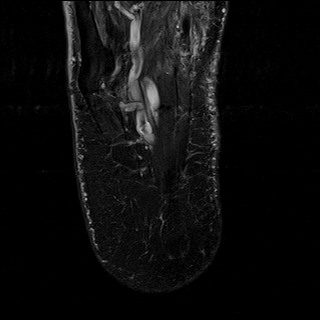
[im 15/50]
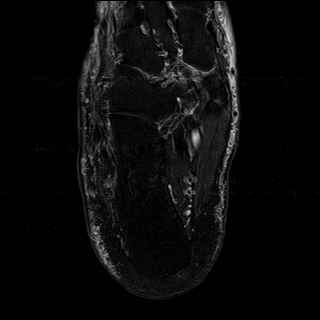
[im 22/50]
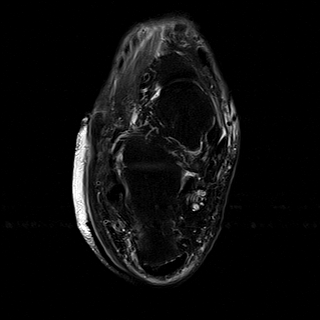
[im 29/50]
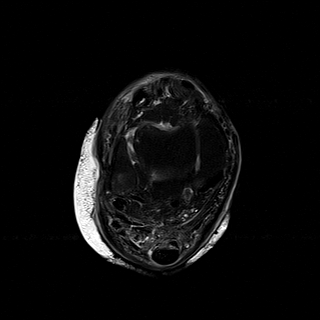
[im 36/50]
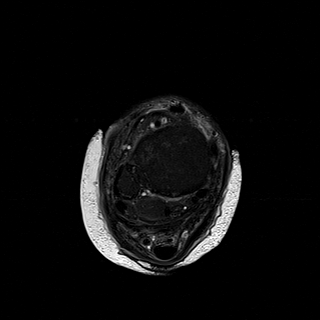
[im 43/50]
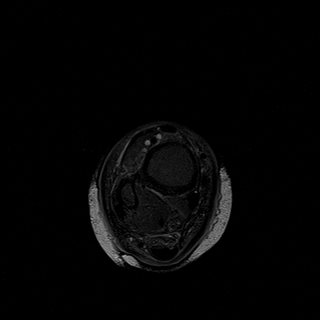
[im 50/50]
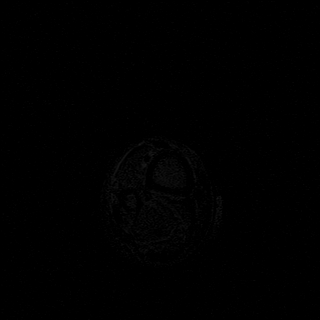

[Series 5: T2 fat-sat · coronal · 3.0mm · 0.56mm/px · 8 of 50 slices shown (2 of 3)]
[im 1/50]
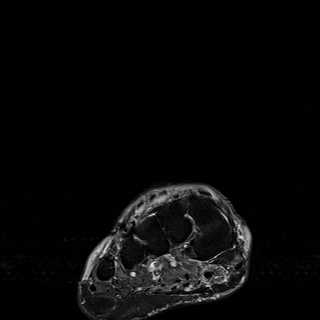
[im 8/50]
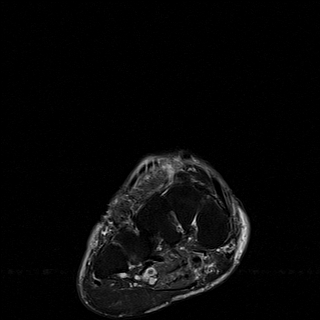
[im 15/50]
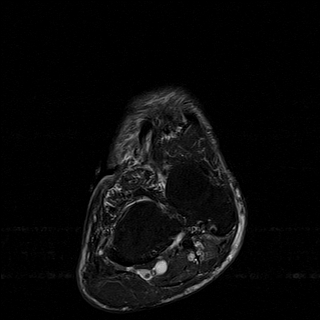
[im 22/50]
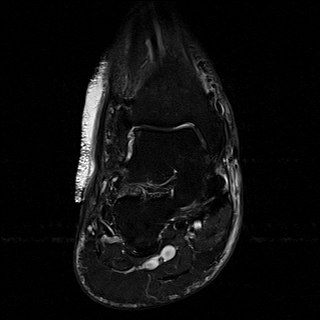
[im 29/50]
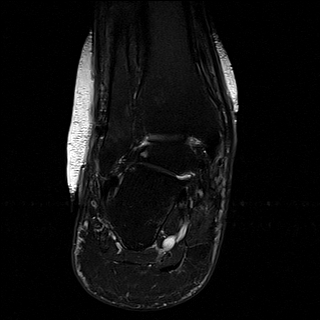
[im 36/50]
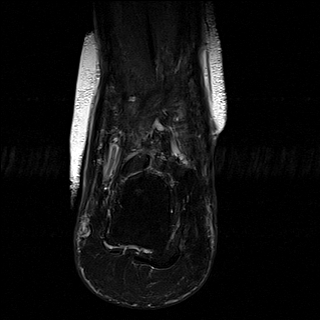
[im 43/50]
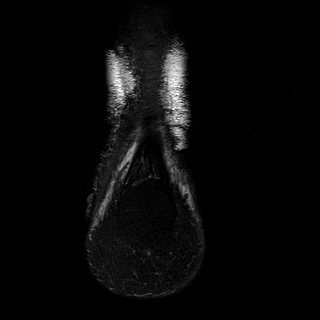
[im 50/50]
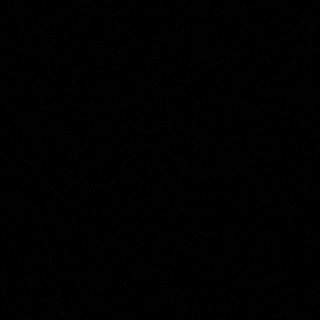

[Series 6: T1 · sagittal · 3.0mm · 0.56mm/px · 5 of 34 slices shown]
[im 1/34]
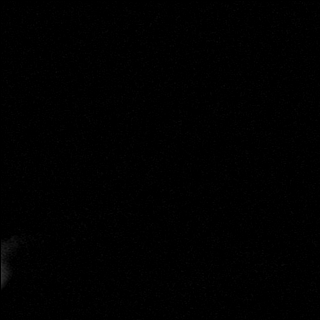
[im 9/34]
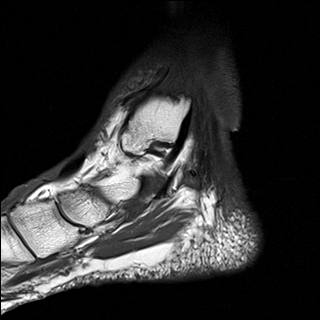
[im 17/34]
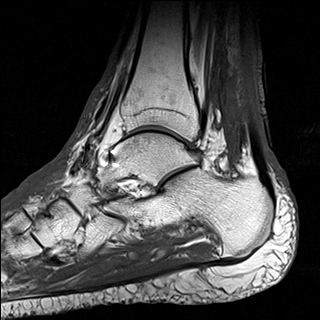
[im 25/34]
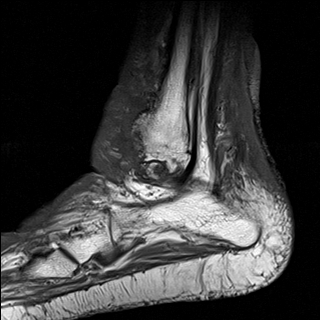
[im 34/34]
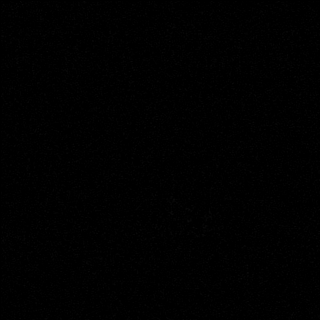

[Series 7: T2 fat-sat · sagittal · 3.0mm · 0.70mm/px · 5 of 34 slices shown (3 of 3)]
[im 1/34]
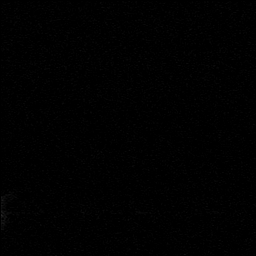
[im 9/34]
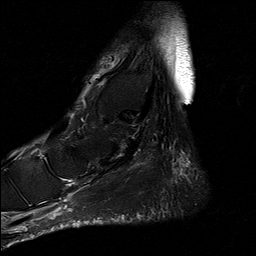
[im 17/34]
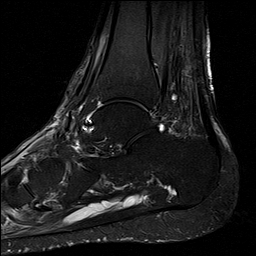
[im 25/34]
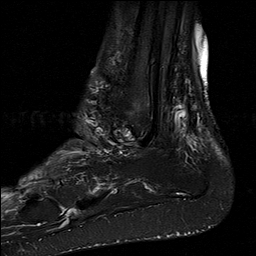
[im 34/34]
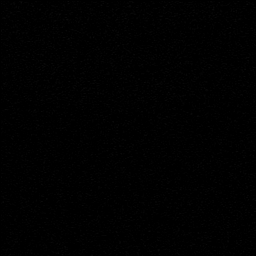

[Series 8: STIR · sagittal · 3.0mm · 0.35mm/px · 5 of 34 slices shown]
[im 1/34]
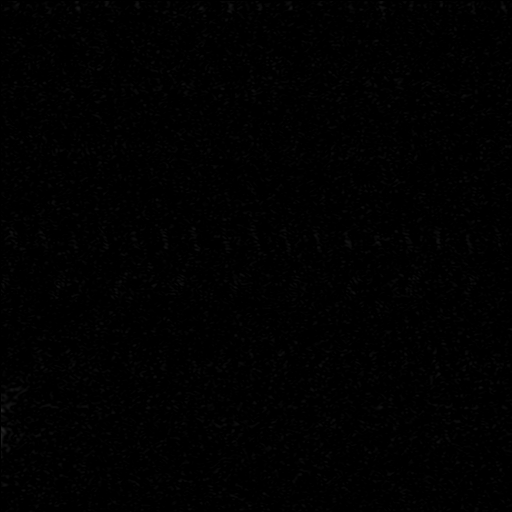
[im 9/34]
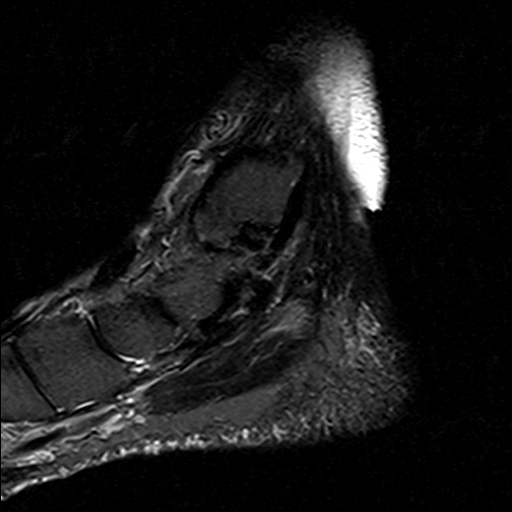
[im 17/34]
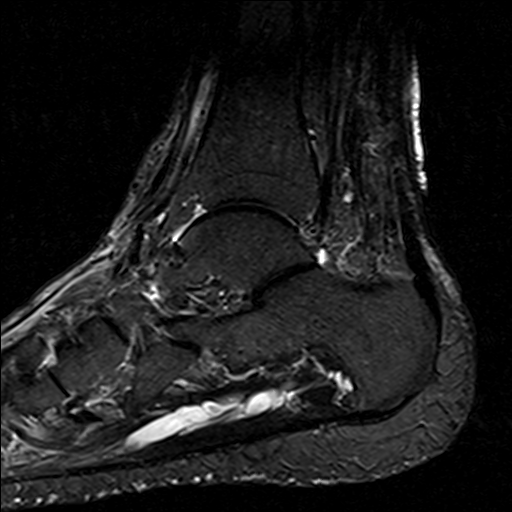
[im 25/34]
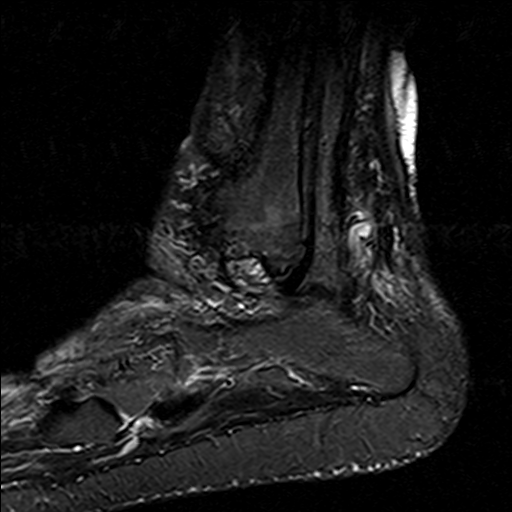
[im 34/34]
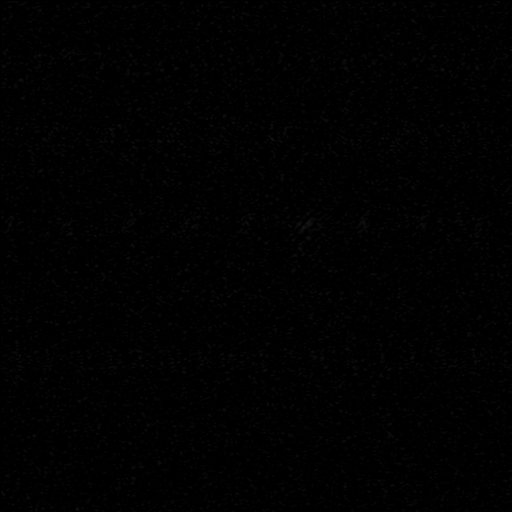

[40 of 40 positions shown; findings below may reference images not displayed]

FINDINGS: TENDONS

Peroneal: Intact.

Posteromedial: Intact.

Anterior: Intact.

Achilles: Intact.

Plantar Fascia: Normal.

LIGAMENTS

Lateral: The anterior talofibular ligament appears attenuated
consistent with remote sprain. Lateral ligaments are otherwise
intact.

Medial: Intact.

CARTILAGE

Ankle Joint: Negative.

Subtalar Joints/Sinus Tarsi: Negative.

Bones: No acute bony abnormality. There is some susceptibility
artifact about the distal fibula consistent with fixation of a
remote healed fracture. Hardware has been removed.

Other: The patient's skin wounds are not readily visible. There is
bandaging about the posterior aspect of the ankle. No abscess is
identified.
IMPRESSION: Negative for abscess, osteomyelitis or septic joint.

Remote healed distal fibular fracture.

Attenuated appearance of the calcaneofibular ligament consistent
with chronic sprain.

## 2018-06-02 ENCOUNTER — Encounter: Payer: Medicare HMO | Attending: Physician Assistant | Admitting: Physician Assistant

## 2018-06-02 DIAGNOSIS — J449 Chronic obstructive pulmonary disease, unspecified: Secondary | ICD-10-CM | POA: Diagnosis not present

## 2018-06-02 DIAGNOSIS — L97312 Non-pressure chronic ulcer of right ankle with fat layer exposed: Secondary | ICD-10-CM | POA: Diagnosis not present

## 2018-06-02 DIAGNOSIS — Z86718 Personal history of other venous thrombosis and embolism: Secondary | ICD-10-CM | POA: Insufficient documentation

## 2018-06-02 DIAGNOSIS — Z7901 Long term (current) use of anticoagulants: Secondary | ICD-10-CM | POA: Insufficient documentation

## 2018-06-02 DIAGNOSIS — I872 Venous insufficiency (chronic) (peripheral): Secondary | ICD-10-CM | POA: Diagnosis not present

## 2018-06-02 DIAGNOSIS — Z87891 Personal history of nicotine dependence: Secondary | ICD-10-CM | POA: Diagnosis not present

## 2018-06-02 DIAGNOSIS — M1990 Unspecified osteoarthritis, unspecified site: Secondary | ICD-10-CM | POA: Diagnosis not present

## 2018-06-03 ENCOUNTER — Ambulatory Visit: Payer: Medicare HMO | Admitting: Physician Assistant

## 2018-06-05 NOTE — Progress Notes (Signed)
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 Switched to RTD last week. 03/27/16 patient is been using Iodoflex. Wound bed has improved and debridement is certainly easier 04/10/2016 -- he has been scheduled for a venous duplex study towards the end of the month 04/17/16; has been using silver alginate, states  that the Iodoflex was hurting his wound and since that is been changed he has had  no pain unfortunately the surface of the wound continues to be unhealthy with thick gelatinous slough and nonviable tissue. The wound will not heal like this. 04/20/2016 -- the patient was here for a nurse visit but I was asked to see the patient as the slough was quite significant and the nurse needed for clarification regarding the ointment to be used. 04/24/16; the patient's wounds on the right medial and right lateral ankle/malleolus both look a lot better today. Less adherent slough healthier tissue. Dimensions better especially medially 05/01/16; the patient's wound surface continues to improve however he continues to require debridement switch her easier each week. Continue Santyl/Metahydrin mixture Hydrofera Blue next week. Still drainage on the medial aspect according to the intake nurse 05/08/16; still using Santyl and Medihoney. Still a lot of drainage per her intake nurse. Patient has no complaints pain fever chills etc. 05/15/16 switched the Hydrofera Blue last week. Dimensions down especially in the medial right leg wound. Area on the lateral which is more substantial also looks better still requires debridement 05/22/16; we have been using Hydrofera Blue. 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 Currently he tells me that the pain is doing very well about a 1 out of 10 today. Yesterday was a little bit worse but he tells me that he was more active watering his flowers that day. Overall he feels that his symptoms are doing significantly better at this point in time. His edema continues to be controlled well with the 4-layer compression wrap and he really has not noted any odor at this point in time. He is tolerating the dressing changes when they are performed well. 06/05/16 at this  point in time today patient currently shows no interval signs or symptoms of local or systemic infection. Again his pain level he rates to be a 1 out of 10 at most and overall he tells me that generally this is not giving him much trouble. In fact he even feels maybe a little bit better than last week. We have continue with the 4-layer compression wrap in which she tolerates very well at this point. He is continuing to utilize the [place]. 06/12/16 I think there has been some progression in the status of both of these wounds over today again covered in a gelatinous surface. Has been using Hydrofera Blue. We had used Iodoflex in the past I'm not sure if there was an issue other than changing to something that might progress towards closure faster 06/19/16; he did not tolerate the Flexeril last week secondary to pain and this was changed on Friday back to [facility] area he continues to have copious amounts of gelatinous surface slough which is think inhibiting the speed of healing this area 06/26/16 patient over the last week has utilized the Santyl to try to loosen up some of the tightly adherent slough that was noted on evaluation last week. The good news is he tells me that the medial malleoli region really does not bother him the right llateral malleoli region is more tender to palpation at this point in time especially in the central/inferior location. However it does appear that the Santyl has done his job to loosen up the adherent slough at this  point in time. Fortunately he has no interval signs or symptoms of infection locally or systemically no purulent discharge noted. 07/03/16 at this point in time today patient's wounds appear to be significantly improved over the right medial and lateral malleolus locations. He has much less tenderness at this point in time and the wounds appear clean her although there is still adherent slough this is sufficiently improved over what  I saw last week. I still see no evidence of local infection. 07/10/16; continued gradual improvement in the right medial and lateral malleolus locations. The lateral is more substantial wound now divided into 2 by a rim of normal epithelialization. 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 He denies any complaints of pain or intolerance to compression. Both ulcers are smaller in size oriented today's measurements, both are covered with a softly adherent slough. 07/24/16; medial wound is smaller, lateral about the same although surface looks better. Still using Hydrofera Blue 07/31/16; arrives today complaining of pain in the lateral part of his foot. Nurse reports a lot more drainage. He has been using Hydrofera Blue. Switch to silver alginate today 08/03/2016 -- I was asked to see the patient was here for a nurse visit today. I understand he had a lot of pain in his right lower extremity and was having blisters on his right foot which have not been there before. Though he started on doxycycline he does not have blisters elsewhere on his body. I do not believe this is a drug allergy. also mentioned that there was a copious purulent discharge from the wound and clinically there is no evidence of cellulitis. 08/07/16; I note that the patient came in for his nurse check on Friday apparently with blisters on his toes on the right than a lot of swelling in his forefoot. He continued on the doxycycline that I had prescribed on 12/8. A culture was done of the lateral wound that showed a combination of a few Proteus and Pseudomonas. Doxycycline might of covered the Proteus but would be unlikely to cover the Pseudomonas. He is on Coumadin. He arrives in the clinic today feeling a lot better states the pain is a lot better but nothing specific really was done other than to rewrap the  foot also noted that he had arterial studies ordered in August although these were never done. It is reasonable to go ahead and reorder these. 08/14/16; generally arrives in a better state today in terms of the wounds he has taken cefdinir for one week. Our intake nurse reports copious amounts of drainage but the patient is complaining of much less pain. He is not had his PT and INR checked and I've asked him to do this today or tomorrow. 08/24/2016 -- patient arrives today after 10 days and said he had a stomach upset. His arterial study was done and I have reviewed this report and find it to be within normal limits. However I did not note any venous duplex studies for reflux, and Dr. Leanord Hawking may have ordered these in the past but I will leave it to him to decide if he needs these. The patient has finished his course of cefdinir. 08/28/16; patient arrives today again with copious amounts of thick really green drainage for our intake nurse. He states he has a very tender spot at the superior part of the lateral wound. Wounds are larger 09/04/16; no real change in  the condition of this patient's wound still copious amounts of surface slough. Started him on Iodoflex last week he is completing another course of Cefdinir or which I think was done empirically. His arterial study showed ABIs were 1.1 on the right 1.5 on the left. He did have a slightly reduced ABI in the right the left one was not obtained. Had calcification of the right posterior tibial artery. The interpretation was no segmental stenosis. His waveforms were triphasic. [name], [name] ([id number]) His reflux studies are later this month. Depending on this I'll send him for a vascular consultation, he may need to see plastic surgery as I believe he is had plastic surgery on this foot in the past. He had an injury to the foot in the 1980s. 1/16 /18 right lateral greater than right medial ankle wounds on the right in the setting of  previous skin grafting. 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 He does not have arterial issues. Each week he comes in with the same adherent surface slough although there was less of this today 09/18/16; right lateral greater than right medial lower extremity wounds in the setting of previous skin grafting and trauma. He has least to vein laser ablation scheduled for February 2 for venous reflux. He does not have significant arterial disease. Problem has been very difficult to handle surface slough/necrotic tissue. Recently using Iodoflex for this with some, albeit slow improvement 09/25/16; right lateral greater than right medial lower extremity venous wounds in the setting of previous skin grafting. He is going for ablation surgery on February 2 after this he'll come back here for rewrap. He has been using Iodoflex as the primary dressing. 10/02/16; right lateral greater than right medial lower extremity wounds in the setting of previous skin grafting. He had his ablation surgery last week, I don't have a report. He tolerated this well. Came in with a thigh-high Unna boots on Friday. We have been using Iodoflex as the primary dressing. His measurements are improving 10/09/16; continues to make nice aggressive in terms of the wounds on his lateral and medial right ankle in the setting of previous skin grafting. Yesterday he noticed drainage at one of his surgical sites from his venous ablation on the right calf. He took off the bandage over this area felt a "popping" sensation and a reddish-brown drainage. He is not complaining of any pain 10/16/16; he continues to make nice progression in terms of the wounds on the lateral and medial malleolus. Both smaller using Iodoflex. He had a surgical area in his posterior mid calf we have been using iodoform. All the wounds are down and dimensions 10/23/16; the patient  arrives today with no complaints. He states the Iodoflex is a bit uncomfortable. He is not systemically unwell. We have been using Iodoflex to the lateral right ankle and the medial and Aquacel Ag to the reflux surgical wound on the posterior right calf. All of these wounds are doing well 10/30/16; patient states he has no pain no systemic symptoms. I changed him to [facility] last week. Although the wounds are doing well 11/06/16; patient reports no pain or systemic symptoms. We continue with Hydrofera Blue. Both wound areas on the medial and lateral ankle appear to be doing well with improvement and dimensions and improvement in the wound bed. 11/13/16; patient's dimensions continued to improve. We continue with Hydrofera Blue on the  medial and lateral side. Appear to be doing well with healthy granulation and advancing epithelialization 11/20/16; patient's dimensions improving laterally by about half a centimeter in length. Otherwise no change on the medial side. Using [facility] 12/04/16; no major change in patient's wound dimensions. Intake nurse reports more drainage. 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 He is voicing no complaints or concerns. He has been tolerating his twice weekly compression therapy changes 12/11/16 Patient complains of pain and increased drainage.. wants hydrofera blue 12/18/16 improvement. Sorbact 12/25/16; medial wound is smaller, lateral measures the same. Still on sorbact 01/01/17; medial wound continues to be smaller, lateral measures about the same however there is clearly advancing epithelialization here as well in fact I think the wound will ultimately divided into 2 open areas 01/08/17; unfortunately today fairly significant regression in several areas. Surface of the lateral wound covered again in adherent necrotic material which is difficult to  debridement. He has significant surrounding skin maceration. The expanding area of tissue epithelialization in the middle of the wound that was encouraging last week appears to be smaller. There is no surrounding tenderness. The area on the medial leg also did not seem to be as healthy as last week, the reason for this regression this week is not totally clear. We have been using Sorbact for the last 4 weeks. We'll switched of polymen AG which we will order via home medical supply. If there is a problem with this would switch back to Iodoflex 01/15/18; drainage,odor. No change. Switched to polymen last week 01/22/17; still continuous drainage. Culture I did last week showed a few Proteus pansensitive. I did this culture because of drainage. Put him on Augmentin which she has been taking since Saturday however he is developed 4-5 liquid bowel movements. He is also on Coumadin. Beyond this wound is not changed at all, still nonviable necrotic surface material which I debrided reveals healthy granulation line 01/29/17; still copious amounts of drainage reported by her intake nurse. Wound measuring slightly smaller. Currently fact on Iodoflex although I'm looking forward to changing back to perhaps [facility] or polymen AG 02/05/17; still large amounts of drainage and presenting with really large amounts of adherent slough and necrotic material over the remaining open area of the wound. We have been using Iodoflex but with little improvement in the surface. Change to [facility] 02/12/17; still large amount of drainage. Much less adherent slough however. Started [employer]	[place] last week STARR, ENGEL ([id number]) 02/19/17; drainage is better this week. Much less adherent slough. Perhaps some improvement in dimensions. Using [facility] 02/26/17; severe venous insufficiency wounds on the right lateral and right medial leg. Drainage is some better and slough is less adherent we've been using  Hydrofera Blue 03/05/17 on evaluation today patient appears to be doing well. His wounds have been decreasing in size and overall he is pleased with how this is progressing. We are awaiting approval for the epigraph which has previously been recommended in the meantime the [facility] Dressing is to be doing very well for him. 03/12/17; wound dimensions are smaller still using Hydrofera Blue. Comes in on Fridays for a dressing change. 03/19/17; wound dimensions continued to contract. Healing of this wound is complicated by continuous significant drainage as well as recurrent buildup of necrotic surface material. We looked into Apligraf and he has a $290 co-pay per application but truthfully I think the drainage  as well as the nonviable surface would preclude use of Apligraf there are any other skin substitute at this point therefore the continued plan will be debridement each clinic visit, 2 times a week dressing changes and continued use of Hydrofera Blue. improvement has been very slow but sustained 03/26/17 perhaps slight improvements in peripheral epithelialization is especially inferiorly. Still with large amount of drainage and tightly adherent necrotic surface on arrival. Along with the intake nurse I reviewed previous treatment. He worsened on Iodoflex and had a 4 week trial of sorbact, Polymen AG and a long courses of Aquacel Ag. He is not a candidate for advanced treatment options for many reasons 04/09/17 on evaluation today patient's right lower extremity wounds appear to be doing a little better. Fortunately he has no significant discomfort and has been tolerating the dressing changes including the wraps without complication. With that being said he really does not have swelling anymore compared to what he has had in the past following his vascular intervention. He wonders if potentially we could attempt avoiding the rats to see if he could cleanse the wound in between to try to  prevent some of the fiber and its buildup that occurs in the interim between when we see him week to week. No fevers, chills, nausea, or vomiting noted at this time. 04/16/17; noted that the staff made a choice last week not to put him in compression. The patient is changing his dressing at home using [facility] and changing every second day. His wife is washing the wound with saline. He is using kerlix. Surprisingly he has not developed a lot of edema. This choice was made because of the degree of fluid retention and maceration even with changing his dressing twice a week 04/23/17; absolutely no change. Using Hydrofera Blue. Recurrent tightly adherent nonviable surface material. This is been refractory to Iodoflex, sorbact and now Hydrofera Blue. More recently he has been changing his own dressings at home, cleansing the wound in the shower. He has not developed lower extremity edema 04/30/17; if anything the wound is larger still in adherent surface although it debrided easily today. I've been using Mehta honey for 1 week and I'd like to try and do it for a second week this see if we can get some form of viable surface here. 05/07/17; patient arrives with copious amounts of drainage and some pain in the superior part of the wound. He has not been systemically unwell. He's been changing this daily at home 05/14/17; patient arrives today complaining of less drainage and less pain. Dimensions slightly better. Surface culture I did of this last week grew MSSA I put him on dicloxacillin for 10 days. Patient requesting a further prescription since he feels so much better. He did not obtain the [facility] AG for reasons that aren't clear, he has been using Hydrofera Blue 05/21/17; perhaps some less drainage and less pain. He is completing another week of doxycycline. Unfortunately there is no change in the wound measurements are appearance still tightly adherent necrotic surface material that is really  defied treatment. We have been using Hydrofera Blue most recently. He did not manage to get Anasept. He was told it was prescription at [facility] 05/29/17; absolutely no improvement in these wound areas. He had remote plastic surgery with who was done at [facility] in [place] surrounding this area. This was a traumatic wound with extensive plastic surgery/skin grafting at that time. I switched him to sorbact last week to see if he can  do anything about the recurrent necrotic surface and drainage. This is largely been refractory to Iodoflex/Hydrofera Blue/alginates. I believe we tried Elby Showers and more recently Medi honey l however the drainage is really too excessive 06/04/17; patient went to see Dr. Ulice Bold. Per the patient she ordered him a compression stocking. Continued the Anasept gel and sorbact was already on. No major improvement in the condition of the wounds in fact the medial wound looks larger. Again per the patient she did not feel a skin graft or operative debridement was indicated The patient had previous venous ablation in February 2018. Last arterial studies were in December 2017 and were within normal limits 06/18/17; patient arrives back in clinic today using Anasept gel with sorbact. He changes this every day is wearing his own compression stocking. The patient actually saw Dr. Leta Baptist of plastic surgery. I've reviewed her note. The appointment was on 05/30/17. The basic issue with here was that she did not feel that skin grafts for patients with underlying stasis and edema have a good long-term success rate. She also discuss skin substitutes which has been done here as well however I have not been able to get the surface of these wounds to something that I think would support skin substitutes. This is why he felt he might need an ROLIN, SCHULT. ([id number]) operative debridement more aggressively than I can do in this clinic Review of systems; is otherwise negative  he feels well 07/02/17; patient has been using Anasept gel with Sorbact. He changes this every day scrubs out the wound beds. Arrives today with the wound bed looking some better. Easier debridement 07/16/17; patient has been using Anasept gel was sorbact for about a month now. The necrotic service on his wound bed is better and the drainage is now down to minimal but we've not made any improvements in the epithelialization. Change to Santyl today. Surface of the wound is still not good enough to support skin substitutes 07/30/17 on evaluation today patient appears to be doing very well in regard to his right bimalleolus wounds. He has been tolerating the treatment with the Anasept gel and sorbact. However we were going to try and switch to Santyl to be more aggressive with these wounds. This was however cost prohibitive. Therefore we will likely go back to the sorbact. Nonetheless he is not having any significant discomfort he still is forming slough over the wound location but nothing too significant. The wounds do appear to be filling in nicely. 08/13/17; I have not seen this wound in about a month. There is not much change. The fact the area medially looks larger. He has been using sorbact and Anasept for over a month now without much effect. Arrives in clinic stating that he does not want the area debrided 08/28/17; patient arrives with the areas on his right medial and right lateral ankle worse. The wounds of expanded there is erythema and still drainage. There is no pain and no tenderness. We had been using Keracel AG clearly not doing well. 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 He states that even the evening that we began  utilizing the Iodoflex he started having burning. Subsequently this never really improved and he eventually discontinue  the use of the Iodoflex altogether. Unfortunately he did not let us know about any of this until today. I'm unsure of any specific infection at this point although I am going to obtain a wound culture today in order to see if there's anything that could potentially be causing an infection as well. It does sound as if he's had the Iodoflex before and did not have this kind of reaction although this does sound very specific for a reaction to the Iodoflex. 09/17/17 on evaluation today patient appears to be doing significantly better compared to last week's evaluation. At that time it actually appears that he did have an infection the good news is that we have been able to get this under control with switching to the [employer] solution he's not having as much pain and discomfort he still does have some erythema surrounding the medial aspect wound. He has been tolerating the Dakin's soaked gauze dressing which is excellent and that his wounds do not seem to have as much adherent slough. Overall I'm pleased with the progress she's made in last week. 11/05/17 on evaluation today patient appears to be doing better in regard to his right medial and lateral malleolus ulcers. He has been tolerating the dressing changes without complication. I do flight the Freada Bergeron is helping with additional granulation at this point in the wound beds do appear to be a little bit better. With that being said patient does not appear to have any evidence of significant infection at this point there is no erythema as he has previously noted he does note that the 30 day course of antibiotics I placed him on will end tomorrow. 11/12/17 on evaluation today patient actually appears to be doing excellent in regard to his right medial and lateral malleolus ulcers. He has been tolerating the dressing changes without  complication. Fortunately he has no evidence of infection at this time and overall I believe he is progressing extremely nicely he has a lot of new epithelialization noted on evaluation today. Patient likewise is very pleased with the progress even just since last week. 11/19/17 on evaluation today patient appears to be doing about the same in regard to his ulcerations. He does not have any additional breakdown although he really has not noted any significant improvement either over the past week. With that being said the more concerning thing at this time is that he is beginning to show signs of erythema surrounding both wounds which is what typically happens and then he will develop into a more overt infection. This has been the course for some time. I hope that doing the 30 day in a biotic cycle this past time would break that so that he would not have the same type of thing happen again. Nonetheless it appears that is digressing back into this infected state unfortunately. With that being said he is not having any pain currently which is good he typically does start to hurt more as this progresses. Fortunately there does not appear to be any evidence of infection systemically which is good news. 11/26/17 on evaluation today patient appears to actually be doing rather well in regard to his ulcer compared to last week. He still has your theme and noted around both the medial and lateral malleolus ulcers of the right lower extremity. He does not seem to be quite as overtly infected however. He has been tolerating the dressing changes without complication which is good news. The gentamicin cream does seem to have been of  benefit for him. With that being said he does actually have an appointment with Dr. Sampson Goon this morning to see if there's anything that would be recommended in the way of IV antibiotic [name], Kimoni [name]. ([id number]) therapy. Otherwise he still has slough noted on the surface of  the wound. 12/03/17 on evaluation today patient presents for follow-up concerning his ongoing right lower extremity ulcers. He has been tolerating the dressing changes without complication he states he has not really been using gentamicin on the lateral ankle and this seems to be doing very well. For that reason I think that is probably fine to put a hold on the gentamicin he states his burns and stings when he applies it and we maybe be able to just use this intermittently when things become more irritated. Other than that the good news is I did review his MRI which was performed on 11/28/17 and revealed that he has a negative MRI for abscess, osteomyelitis, or septic joint. Obviously these were the issues that I was concerned with the possibility of and I'm pleased to find that there is no problems in that regard. I did also review Dr. Jarrett Ables note from infectious disease he discussed performed some lab work which apparently was done and then subsequently was going to consider the possibility of Keflex long-term for prevention of infection although this has not been prescribed as of yet. 12/09/17 on evaluation today patient appears to be doing a little bit worse in regard to his right medial and lateral malleolus ulcers. Fortunately the wound does not seem to be significantly worse although he does have a lot more drainage especially the lateral aspect he is having a lot more pain than what he has noted more recently. Fortunately there does not appear to be any evidence of systemic infection which is good news. Again he has seen Dr. Sampson Goon but he has not been placed on the potential Keflex which was recommended as a possibility at least yet. 12/17/17 unfortunately on evaluation today the patient's wound appears to be doing much worse. He has been performing the dressing changes as directed. Upon a review of notes in epic it does appear that Dr. Jarrett Ables office specifically sending Keflex  for the patient on the 16th which was last Tuesday. With that being said the patient states that though this was sent into [facility] that [facility] stated they never received it and subsequently the patient never took anything. He tells me he has been in bed since Friday due to the increased pain and overall general malaise. No fevers, chills, nausea, or vomiting noted at this time. 02/25/18 on evaluation today patient appears to be doing rather well in regard to his bilateral right malleolus ulcers. He has been tolerating the dressing changes without complication. Currently there does not appear to be any evidence of infection. Overall I'm very pleased with the progress that seems to been made up to this point. 03/11/18 on evaluation today patient actually appears to be doing very well in regard to his right ankle ulcers. He's been tolerating the dressing changes without complication. Fortunately there does not appear to be any evidence of sniffing infection I do believe that the gentamicin cream continues to be of benefit for him. Fortunately he is showing signs of healing in which time I see him. He also tells me he's having no pain. 03/25/18 on evaluation today patient actually appears to show signs of improvement especially in regard to the right lateral ankle ulcer. He has been  tolerating the dressing changes without complication. In general I'm very pleased with the progress that has been made up to this point. 04/08/18- He presents in follow up evaluation for right bimalleolar wounds; there is essentially no change. He admits to removing the dressing at night to allow the wounds to be "air out". We will continue with same treatment plan and he will follow up in two weeks 04/22/18 on evaluation today patient seems very frustrated with this situation currently as far as the ulcer on his right medial and lateral malleolus is concerned. He states this has been going on a very long time and obviously he  does have a lot of bills as well is far as wound care is concerned. With that being said he tells me that he's been tolerating the dressing changes well although he did clarify that what he is doing is putting the medicine on in the morning after shower and then subsequently he takes the dressing off at night leaving it open to air. I previously asked him about this and I was not aware that he was doing this. Nonetheless that may be slowing things down a bit at this point. Fortunately there does not appear to be any evidence of severe infection although he does have some evidence of your theme is surrounding the wound bed. He is still using the gentamicin cream. 05/06/18 on evaluation today patient actually presents for evaluation of his right medial malleolus ulcers. He's been tolerating the dressing changes without complication. With that being said it does appear that he is doing better compared to last evaluation. He's not having the pain like he did previous. He also states that blisters that he had coming up when I saw him last did resolve and ended up doing okay he did go to the ER they gave him some cream to put on it he's not sure what the name of the cream was. He was down in [facility] when he called me and I spoke with him previous. Nonetheless he states that they told him to put the medicine on his our due list. With that being said I'm not really 100% sure that the medicine had anything to do with this due to the fact that again he was having the blisters when he came into the office which is why we actually put them on the anabiotic I was concerned about him having an infection which was causing the blistering and increased pain in his extremity. Nonetheless this is something that we can definitely be cautious of in the future it was Bactrim Falzon, [name]. ([id number]) that we had him on. Overall I'm glad he is doing better. 05/20/18 on evaluation today patient actually appears  to be doing fairly well in regard to his ulcer on the right lateral and medial malleolus locations. Both seem to be doing decently well he does have slough building up on the surface of the wound. He has been using the Hibiclense as I instructed him. Overall there's no evidence of significant infection which is good news. 06/02/18 on evaluation today patient actually appears to be doing excellent in regard to his right medial and lateral malleolus ulcers. He has been tolerating the dressing changes without complication. There does not appear to be any evidence of infection at this time which is great news. In general I feel like he has made great progress in the past several weeks. He has good granulation noted over areas that have not had good granulation  previously and epithelialization even more so in several areas. Both wounds measured smaller. Patient History Information obtained from Patient. Social History Former smoker - 10 years ago, Marital Status - Married, Alcohol Use - Moderate, Drug Use - No History, Caffeine Use - Never. Medical And Surgical History Notes Constitutional Symptoms (General Health) Vein Filter (groin area); CODA; H/O Blood Clots; pulmonary hypertensive arterial disease Review of Systems (ROS) Constitutional Symptoms (General Health) Denies complaints or symptoms of Fever, Chills. Respiratory The patient has no complaints or symptoms. Cardiovascular The patient has no complaints or symptoms. Psychiatric The patient has no complaints or symptoms. Objective Constitutional Well-nourished and well-hydrated in no acute distress. Vitals Time Taken: 8:01 AM, Height: 76 in, Weight: 238 lbs, BMI: 29, Temperature: 98.3 F, Pulse: 69 bpm, Respiratory Rate: 18 breaths/min, Blood Pressure: 145/71 mmHg. Respiratory normal breathing without difficulty. Psychiatric this patient is able to make decisions and demonstrates good insight into disease process. Alert and  Oriented x 3. pleasant and cooperative. [name], [name] ([id number]) General Notes: On inspection today patient's wound beds actually appear to be doing excellent in fact there was much less slough noted today then at any point recent that I have seen him. He's been tolerating the dressing changes without complication. Overall I'm very pleased with how things appear. Integumentary (Hair, Skin) Wound #1 status is Open. Original cause of wound was Gradually Appeared. The wound is located on the Right,Lateral Malleolus. The wound measures 4cm length x 3.5cm width x 0.2cm depth; 10.996cm^2 area and 2.199cm^3 volume. Wound #2 status is Open. Original cause of wound was Gradually Appeared. The wound is located on the Right,Medial Malleolus. The wound measures 3.8cm length x 2.5cm width x 0.2cm depth; 7.461cm^2 area and 1.492cm^3 volume. 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 There was a Excisional Skin/Subcutaneous Tissue Debridement with a total area of 14 sq cm performed by STONE III, HOYT E., PA-[name]. With the following instrument(s): Curette to remove Viable and Non-Viable tissue/material. Material removed includes Subcutaneous Tissue, Slough, and Biofilm after achieving pain control using Other (lidocaine 4%). No specimens were taken. A time out was conducted at 08:16, prior to the start of the procedure. A Minimum amount of bleeding was controlled with Pressure. The procedure was tolerated well  with a pain level of 0 throughout and a pain level of 0 following the procedure. Post Debridement Measurements: 4cm length x 3.5cm width x 0.2cm depth; 2.199cm^3 volume. Character of Wound/Ulcer Post Debridement is stable. Severity of Tissue Post Debridement is: Fat layer exposed. 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 There was a Excisional Skin/Subcutaneous Tissue Debridement with a total area of 9.5 sq cm performed by STONE III, HOYT E., PA-[name]. With the following instrument(s): Curette to remove Viable and Non-Viable tissue/material. Material removed includes Subcutaneous Tissue, Slough, and Biofilm after achieving pain control using Other (lidocaine 4%). No specimens were taken. A time out was conducted at 08:16, prior to the start of the procedure. A Minimum  amount of bleeding was controlled with Pressure. The procedure was tolerated well with a pain level of 0 throughout and a pain level of 0 following the procedure. Post Debridement Measurements: 3.8cm length x 2.5cm width x 0.3cm depth; 2.238cm^3 volume. Character of Wound/Ulcer Post Debridement is stable. Severity of Tissue Post Debridement is: Fat layer exposed. Post procedure Diagnosis Wound #2: Same as Pre-Procedure [name], [name]. ([id number]) Plan Wound Cleansing: Wound #1 Right,Lateral Malleolus: Clean wound with Normal Saline. May Shower, gently pat wound dry prior to applying new dressing. - wash with hibiclens daily and leave on 5 mins Wound #2 Right,Medial Malleolus: Clean wound with Normal Saline. May Shower, gently pat wound dry prior to applying new dressing. - wash with hibiclens and leave on 5 mins Anesthetic (add to Medication List): Wound #1 Right,Lateral Malleolus: Topical Lidocaine 4% cream applied to wound bed prior to  debridement (In Clinic Only). Wound #2 Right,Medial Malleolus: Topical Lidocaine 4% cream applied to wound bed prior to debridement (In Clinic Only). Primary Wound Dressing: Wound #1 Right,Lateral Malleolus: Hydrafera Blue Ready Transfer - use hydrafera blue classic in the home and moisten with saline Wound #2 Right,Medial Malleolus: Hydrafera Blue Ready Transfer - use hydrafera blue classic in the home and moisten with saline Secondary Dressing: Wound #1 Right,Lateral Malleolus: Dry Gauze Conform/Kerlix Wound #2 Right,Medial Malleolus: Dry Gauze Conform/Kerlix Dressing Change Frequency: Wound #1 Right,Lateral Malleolus: Change dressing every other day. Wound #2 Right,Medial Malleolus: Change dressing every other day. Follow-up Appointments: Wound #1 Right,Lateral Malleolus: Return Appointment in 2 weeks. Wound #2 Right,Medial Malleolus: Return Appointment in 2 weeks. Edema Control: Wound #1 Right,Lateral Malleolus: Elevate legs to the level of the heart and pump ankles as often as possible Wound #2 Right,Medial Malleolus: Elevate legs to the level of the heart and pump ankles as often as possible Additional Orders / Instructions: Wound #1 Right,Lateral Malleolus: Increase protein intake. Wound #2 Right,Medial Malleolus: Increase protein intake. My suggestion currently is gonna be that we actually continue with the above wound care measures for the next week. Patient is in agreement the plan. If anything changes or worsens in the interim he will let me know. Otherwise will see were things stand at follow-up. Please see above for specific wound care orders. We will see patient for re-evaluation in 2 week(s) here in the clinic. If anything worsens or changes patient will contact our office for additional recommendations. HERCULES, HASLER ([id number]) Electronic Signature(s) Signed: 06/02/2018 1:33:13 PM By: Lenda Kelp PA-[name] Entered By: Lenda Kelp on 06/02/2018  08:30:13 [name] ([id number]) -------------------------------------------------------------------------------- ROS/PFSH Details Patient Name: [name] Date of Service: 06/02/2018 8:00 AM Medical Record Number: [id number] Patient Account Number: [id number] Date of Birth/Sex: Dec 01, 1945 (72 y.o. M) Treating RN: Renne Crigler Primary Care Provider: [name] Other Clinician: Referring Provider: [name] Treating Provider/Extender: STONE III, HOYT Weeks in Treatment: 123 Information Obtained From Patient Wound History Do you currently have one or more open woundso Yes How many open wounds do you currently haveo 1 Approximately how long have you had your woundso 5 months How have you been treating your wound(s) until nowo saline, dressing Has your wound(s) ever healed and then re-openedo Yes Have you had any lab work done in the past montho No Have you tested positive for an antibiotic resistant organism (MRSA, VRE)o No Have you tested positive for osteomyelitis (bone infection)o No Have you had any tests for circulation on your legso No Constitutional Symptoms (General Health) Complaints and Symptoms: Negative  for: Fever; Chills Medical History: Past Medical History Notes: Vein Filter (groin area); CODA; H/O Blood Clots; pulmonary hypertensive arterial disease Eyes Medical History: Positive for: Cataracts - removed Negative for: Glaucoma; Optic Neuritis Ear/Nose/Mouth/Throat Medical History: Negative for: Chronic sinus problems/congestion Hematologic/Lymphatic Medical History: Negative for: Anemia; Hemophilia; Human Immunodeficiency Virus; Lymphedema; Sickle Cell Disease Respiratory Complaints and Symptoms: No Complaints or Symptoms Medical History: Positive for: Chronic Obstructive Pulmonary Disease (COPD) Negative for: Aspiration; Asthma; Pneumothorax; Sleep Apnea; Tuberculosis Cardiovascular JESPER, STIREWALT. ([id number]) Complaints and  Symptoms: No Complaints or Symptoms Medical History: Negative for: Angina; Arrhythmia; Congestive Heart Failure; Coronary Artery Disease; Deep Vein Thrombosis; Hypertension; Hypotension; Myocardial Infarction; Peripheral Arterial Disease; Peripheral Venous Disease; Phlebitis; Vasculitis Gastrointestinal Medical History: Negative for: Cirrhosis ; Colitis; Crohnos; Hepatitis A; Hepatitis B; Hepatitis [name] Endocrine Medical History: Negative for: Type I Diabetes; Type II Diabetes Genitourinary Medical History: Negative for: End Stage Renal Disease Immunological Medical History: Negative for: Lupus Erythematosus; Raynaudos Integumentary (Skin) Medical History: Negative for: History of Burn; History of pressure wounds Musculoskeletal Medical History: Positive for: Osteoarthritis Negative for: Gout; Rheumatoid Arthritis; Osteomyelitis Neurologic Medical History: Negative for: Dementia; Neuropathy; Quadriplegia; Paraplegia; Seizure Disorder Oncologic Medical History: Negative for: Received Chemotherapy; Received Radiation Psychiatric Complaints and Symptoms: No Complaints or Symptoms Medical History: Negative for: Anorexia/bulimia; Confinement Anxiety HBO Extended History Items HAYGEN, ZEBROWSKI. ([id number]) Eyes: Cataracts Immunizations Pneumococcal Vaccine: Received Pneumococcal Vaccination: No Implantable Devices Family and Social History Former smoker - 10 years ago; Marital Status - Married; Alcohol Use: Moderate; Drug Use: No History; Caffeine Use: Never; Advanced Directives: No; Patient does not want information on Advanced Directives; Living Will: No; Medical Power of Attorney: No Physician Affirmation I have reviewed and agree with the above information. Electronic Signature(s) Signed: 06/02/2018 1:33:13 PM By: Lenda Kelp PA-[name] Signed: 06/02/2018 4:35:19 PM By: Renne Crigler Entered By: Lenda Kelp on 06/02/2018 08:27:21 [name]  ([id number]) -------------------------------------------------------------------------------- SuperBill Details Patient Name: [name] Date of Service: 06/02/2018 Medical Record Number: [id number] Patient Account Number: [id number] Date of Birth/Sex: Jun 06, 1946 (72 y.o. 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, sequela T81.31XS Disruption of external operation (surgical) wound, not elsewhere classified, sequela Facility Procedures CPT4: Description Modifier Quantity Code 51884166 11042 - DEB SUBQ TISSUE 20 SQ CM/< 1 ICD-10 Diagnosis Description I87.331 Chronic venous hypertension (idiopathic) with ulcer and inflammation of right lower extremity L97.312 Non-pressure chronic ulcer  of right ankle with fat layer exposed T85.613S 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, sequela T81.31XS Disruption of external operation (surgical) wound, not elsewhere classified, sequela CPT4: 06301601 11045 - DEB SUBQ TISS EA ADDL 20CM 1 ICD-10 Diagnosis Description L97.312 Non-pressure chronic ulcer of right ankle with fat layer exposed Physician Procedures CPT4: Description Modifier Quantity Code 0932355 11042 - WC PHYS SUBQ TISS 20 SQ CM 1 ICD-10 Diagnosis Description I87.331 Chronic venous hypertension (idiopathic) with ulcer and inflammation of right lower extremity L97.312 Non-pressure chronic ulcer of  right ankle with fat layer exposed T85.613S Breakdown (mechanical) of artificial skin graft and decellularized  allodermis, sequela T81.31XS Disruption of external operation (surgical) wound, not elsewhere classified, sequela CPT4: 7322025 11045 - WC PHYS SUBQ TISS  EA ADDL 20 CM 1 ICD-10 Diagnosis Description L97.312 Non-pressure chronic ulcer of right ankle with fat layer exposed Electronic Signature(s) SKYE, RODARTE ([id number]) Signed: 06/02/2018 1:33:13 PM By: Lenda Kelp PA-[name] Entered By: Lenda Kelp on 06/02/2018 08:30:31

## 2018-06-07 NOTE — Progress Notes (Signed)
[name], [name] ([id number]) Visit Report for 06/02/2018 Arrival Information Details Patient Name: [name], [name]. Date of Service: 06/02/2018 8:00 AM Medical Record Number: [id number] Patient Account Number: [id number] Date of Birth/Sex: 1946-04-09 (72 y.o. M) Treating RN: Renne Crigler Primary Care Jaydi Bray: [name] Other Clinician: Referring Wynetta Seith: [name] Treating Trella Thurmond/Extender: Linwood Dibbles, HOYT Weeks in Treatment: 123 Visit Information History Since Last Visit Added or deleted any medications: No Patient Arrived: Ambulatory Any new allergies or adverse reactions: No Arrival Time: 08:00 Had a fall or experienced change in No Accompanied By: self activities of daily living that may affect Transfer Assistance: None risk of falls: Patient Identification Verified: Yes Signs or symptoms of abuse/neglect since last visito No Secondary Verification Process Yes Implantable device outside of the clinic excluding No Completed: cellular tissue based products placed in the center Patient Requires Transmission-Based No since last visit: Precautions: Has Dressing in Place as Prescribed: Yes Patient Has Alerts: Yes Pain Present Now: No Patient Alerts: Patient on Blood Thinner Electronic Signature(s) Signed: 06/02/2018 11:56:54 AM By: Dayton Martes RCP, RRT, CHT Entered By: Dayton Martes on 06/02/2018 08:01:11 [name] ([id number]) -------------------------------------------------------------------------------- Encounter Discharge Information Details Patient Name: [name] Date of Service: 06/02/2018 8:00 AM Medical Record Number: [id number] Patient Account Number: [id number] Date of Birth/Sex: [birth year] (72 y.o. M) Treating RN: Renne Crigler Primary Care Mekai Wilkinson: [name] Other Clinician: Referring Shonnie Poudrier: [name] Treating Court Gracia/Extender: Linwood Dibbles, HOYT Weeks in Treatment:  123 Encounter Discharge Information Items Discharge Condition: Stable Ambulatory Status: Ambulatory Discharge Destination: Home Transportation: Private Auto Accompanied By: self Schedule Follow-up Appointment: Yes Clinical Summary of Care: Post Procedure Vitals: Temperature (F): 98.3 Pulse (bpm): 69 Respiratory Rate (breaths/min): 16 Blood Pressure (mmHg): 145/71 Electronic Signature(s) Signed: 06/02/2018 4:35:19 PM By: Renne Crigler Entered By: Renne Crigler on 06/02/2018 08:31:33 [name] ([id number]) -------------------------------------------------------------------------------- Lower Extremity Assessment Details Patient Name: [name] Date of Service: 06/02/2018 8:00 AM Medical Record Number: [id number] Patient Account Number: [id number] Date of Birth/Sex: [birth year] (71 y.o. M) Treating RN: Huel Coventry Primary Care Lemond Griffee: [name] Other Clinician: Referring Meigan Pates: [name] Treating Kaylina Cahue/Extender: Linwood Dibbles, HOYT Weeks in Treatment: 123 Edema Assessment Assessed: [Left: No] [Right: No] [Left: Edema] [Right: :] Calf Left: Right: Point of Measurement: 40 cm From Medial Instep cm 35.4 cm Ankle Left: Right: Point of Measurement: 16 cm From Medial Instep cm 21.5 cm Vascular Assessment Pulses: Dorsalis Pedis Palpable: [Right:Yes] Posterior Tibial Extremity colors, hair growth, and conditions: Extremity Color: [Right:Hyperpigmented] Hair Growth on Extremity: [Right:No] Temperature of Extremity: [Right:Warm] Capillary Refill: [Right:< 3 seconds] Toe Nail Assessment Left: Right: Thick: Yes Discolored: Yes Deformed: Yes Improper Length and Hygiene: Yes Electronic Signature(s) Signed: 06/04/2018 5:34:51 PM By: Elliot Gurney, BSN, RN, CWS, Kim RN, BSN Entered By: Elliot Gurney, BSN, RN, CWS, Kim on 06/02/2018 08:09:32 [name] ([id number]) -------------------------------------------------------------------------------- Multi  Wound Chart Details Patient Name: [name] Date of Service: 06/02/2018 8:00 AM Medical Record Number: [id number] Patient Account Number: [id number] Date of Birth/Sex: 03-08-1946 (72 y.o. M) Treating RN: Renne Crigler Primary Care Anthony Tamburo: [name] Other Clinician: Referring Fallen Crisostomo: [name] Treating Latrece Nitta/Extender: STONE III, HOYT Weeks in Treatment: 123 Vital Signs Height(in): 76 Pulse(bpm): 69 Weight(lbs): 238 Blood Pressure(mmHg): 145/71 Body Mass Index(BMI): 29 Temperature(F): 98.3 Respiratory Rate 18 (breaths/min): Photos: [N/A:N/A] Wound Location: Right, Lateral Malleolus Right, Medial Malleolus N/A Wounding Event: Gradually Appeared Gradually Appeared N/A Primary Etiology: Venous Leg Ulcer Venous Leg Ulcer N/A Date Acquired: 08/11/2015 01/30/2016 N/A Weeks of Treatment:  123 121 N/A Wound Status: Open Open N/A Clustered Wound: No Yes N/A Measurements L x W x D 4x3.5x0.2 3.8x2.5x0.2 N/A (cm) Area (cm) : 10.996 7.461 N/A Volume (cm) : 2.199 1.492 N/A % Reduction in Area: 36.40% 35.40% N/A % Reduction in Volume: 57.60% -29.20% N/A Classification: Full Thickness Without Full Thickness Without N/A Exposed Support Structures Exposed Support Structures Debridement: Debridement - Excisional Debridement - Excisional N/A Pre-procedure 08:16 08:16 N/A Verification/Time Out Taken: Pain Control: Other Other N/A Tissue Debrided: Subcutaneous, Slough Subcutaneous, Slough N/A Level: Skin/Subcutaneous Tissue Skin/Subcutaneous Tissue N/A Debridement Area (sq cm): 14 9.5 N/A Instrument: Curette Curette N/A Bleeding: Minimum Minimum N/A Hemostasis Achieved: Pressure Pressure N/A Procedural Pain: 0 0 N/A Post Procedural Pain: 0 0 N/A Debridement Treatment Procedure was tolerated well Procedure was tolerated well N/A Response: [name], [name] ([id number]) Post Debridement 4x3.5x0.2 3.8x2.5x0.3 N/A Measurements L x W x D (cm) Post Debridement  Volume: 2.199 2.238 N/A (cm) Periwound Skin Texture: No Abnormalities Noted No Abnormalities Noted N/A Periwound Skin Moisture: No Abnormalities Noted No Abnormalities Noted N/A Periwound Skin Color: No Abnormalities Noted No Abnormalities Noted N/A Tenderness on Palpation: No No N/A Procedures Performed: Debridement Debridement N/A Treatment Notes Electronic Signature(s) Signed: 06/02/2018 4:35:19 PM By: Renne Crigler Entered By: Renne Crigler on 06/02/2018 08:22:06 [name] ([id number]) -------------------------------------------------------------------------------- Multi-Disciplinary Care Plan Details Patient Name: [name] Date of Service: 06/02/2018 8:00 AM Medical Record Number: [id number] Patient Account Number: [id number] Date of Birth/Sex: 05/25/1946 (72 y.o. M) Treating RN: Renne Crigler Primary Care Glen Blatchley: [name] Other Clinician: Referring Eliazar Olivar: [name] Treating Reagen Haberman/Extender: STONE III, HOYT Weeks in Treatment: 123 Active Inactive ` Venous Leg Ulcer Nursing Diagnoses: Knowledge deficit related to disease process and management Goals: Patient will maintain optimal edema control Date Initiated: 04/03/2016 Target Resolution Date: 11/24/2016 Goal Status: Active Interventions: Compression as ordered Treatment Activities: Therapeutic compression applied : 04/03/2016 Notes: ` Wound/Skin Impairment Nursing Diagnoses: Impaired tissue integrity Goals: Ulcer/skin breakdown will heal within 14 weeks Date Initiated: 01/17/2016 Target Resolution Date: 11/24/2016 Goal Status: Active Interventions: Assess ulceration(s) every visit Notes: Electronic Signature(s) Signed: 06/02/2018 4:35:19 PM By: Renne Crigler Entered By: Renne Crigler on 06/02/2018 08:15:21 [name] ([id number]) -------------------------------------------------------------------------------- Pain Assessment Details Patient Name: [name] Date of Service: 06/02/2018 8:00 AM Medical Record Number: [id number] Patient Account Number: [id number] Date of Birth/Sex: 1946-08-12 (72 y.o. M) Treating RN: Renne Crigler Primary Care Caitlynn Ju: [name] Other Clinician: Referring Lynzee Lindquist: [name] Treating Emary Zalar/Extender: STONE III, HOYT Weeks in Treatment: 123 Active Problems Location of Pain Severity and Description of Pain Patient Has Paino No Site Locations Pain Management and Medication Current Pain Management: Electronic Signature(s) Signed: 06/02/2018 11:56:54 AM By: Dayton Martes RCP, RRT, CHT Signed: 06/02/2018 4:35:19 PM By: Renne Crigler Entered By: Dayton Martes on 06/02/2018 08:01:20 [name] ([id number]) -------------------------------------------------------------------------------- Patient/Caregiver Education Details Patient Name: [name] Date of Service: 06/02/2018 8:00 AM Medical Record Number: [id number] Patient Account Number: [id number] Date of Birth/Gender: May 28, 1946 (72 y.o. M) Treating RN: Renne Crigler Primary Care Physician: [name] Other Clinician: Referring Physician: [name] Treating Physician/Extender: Skeet Simmer in Treatment: 123 Education Assessment Education Provided To: Patient Education Topics Provided Wound Debridement: Handouts: Wound Debridement Methods: Explain/Verbal Responses: State content correctly Wound/Skin Impairment: Handouts: Caring for Your Ulcer Methods: Explain/Verbal Responses: State content correctly Electronic Signature(s) Signed: 06/02/2018 4:35:19 PM By: Renne Crigler Entered By: Renne Crigler on 06/02/2018 08:21:53 [name] ([id number]) -------------------------------------------------------------------------------- Wound Assessment Details Patient  Name: [name], [name] Date of Service: 06/02/2018 8:00 AM Medical Record Number:  [id number] Patient Account Number: [id number] Date of Birth/Sex: Jan 16, 1946 (72 y.o. M) Treating RN: Huel Coventry Primary Care Deadrick Stidd: [name] Other Clinician: Referring Elianna Windom: [name] Treating Jadan Hinojos/Extender: Linwood Dibbles, HOYT Weeks in Treatment: 123 Wound Status Wound Number: 1 Primary Etiology: Venous Leg Ulcer Wound Location: Right, Lateral Malleolus Wound Status: Open Wounding Event: Gradually Appeared Date Acquired: 08/11/2015 Weeks Of Treatment: 123 Clustered Wound: No Photos Photo Uploaded By: Curtis Sites on 06/02/2018 08:13:23 Wound Measurements Length: (cm) 4 Width: (cm) 3.5 Depth: (cm) 0.2 Area: (cm) 10.996 Volume: (cm) 2.199 % Reduction in Area: 36.4% % Reduction in Volume: 57.6% Wound Description Full Thickness Without Exposed Support Classification: Structures Periwound Skin Texture Texture Color No Abnormalities Noted: No No Abnormalities Noted: No Moisture No Abnormalities Noted: No Treatment Notes Wound #1 (Right, Lateral Malleolus) 1. Cleansed with: Clean wound with Normal Saline 2. Anesthetic Topical Lidocaine 4% cream to wound bed prior to debridement [name], [name] ([id number]) 4. Dressing Applied: Hydrafera Blue 5. Secondary Dressing Applied ABD and Kerlix/Conform 6. Footwear/Offloading device applied Surgical shoe Electronic Signature(s) Signed: 06/04/2018 5:34:51 PM By: Elliot Gurney, BSN, RN, CWS, Kim RN, BSN Entered By: Elliot Gurney, BSN, RN, CWS, Kim on 06/02/2018 78:46:96 [name] ([id number]) -------------------------------------------------------------------------------- Wound Assessment Details Patient Name: [name] Date of Service: 06/02/2018 8:00 AM Medical Record Number: [id number] Patient Account Number: [id number] Date of Birth/Sex: 04-17-1946 (72 y.o. M) Treating RN: Huel Coventry Primary Care Kayna Suppa: [name] Other Clinician: Referring Lesslie Mckeehan: [name] Treating Melvina Pangelinan/Extender:  Linwood Dibbles, HOYT Weeks in Treatment: 123 Wound Status Wound Number: 2 Primary Etiology: Venous Leg Ulcer Wound Location: Right, Medial Malleolus Wound Status: Open Wounding Event: Gradually Appeared Date Acquired: 01/30/2016 Weeks Of Treatment: 121 Clustered Wound: Yes Photos Photo Uploaded By: Curtis Sites on 06/02/2018 08:13:24 Wound Measurements Length: (cm) 3.8 Width: (cm) 2.5 Depth: (cm) 0.2 Area: (cm) 7.461 Volume: (cm) 1.492 % Reduction in Area: 35.4% % Reduction in Volume: -29.2% Wound Description Full Thickness Without Exposed Support Classification: Structures Periwound Skin Texture Texture Color No Abnormalities Noted: No No Abnormalities Noted: No Moisture No Abnormalities Noted: No Treatment Notes Wound #2 (Right, Medial Malleolus) 1. Cleansed with: Clean wound with Normal Saline 2. Anesthetic Topical Lidocaine 4% cream to wound bed prior to debridement [name], [name] ([id number]) 4. Dressing Applied: Hydrafera Blue 5. Secondary Dressing Applied ABD and Kerlix/Conform 6. Footwear/Offloading device applied Surgical shoe Electronic Signature(s) Signed: 06/04/2018 5:34:51 PM By: Elliot Gurney, BSN, RN, CWS, Kim RN, BSN Entered By: Elliot Gurney, BSN, RN, CWS, Kim on 06/02/2018 08:07:57 [name] ([id number]) -------------------------------------------------------------------------------- Vitals Details Patient Name: [name] Date of Service: 06/02/2018 8:00 AM Medical Record Number: [id number] Patient Account Number: [id number] Date of Birth/Sex: 01-11-1946 (72 y.o. M) Treating RN: Renne Crigler Primary Care Chelsa Stout: [name] Other Clinician: Referring Jessica Seidman: [name] Treating Tuwanna Krausz/Extender: STONE III, HOYT Weeks in Treatment: 123 Vital Signs Time Taken: 08:01 Temperature (F): 98.3 Height (in): 76 Pulse (bpm): 69 Weight (lbs): 238 Respiratory Rate (breaths/min): 18 Body Mass Index (BMI): 29 Blood Pressure  (mmHg): 145/71 Reference Range: 80 - 120 mg / dl Electronic Signature(s) Signed: 06/02/2018 11:56:54 AM By: Dayton Martes RCP, RRT, CHT Entered By: Dayton Martes on 06/02/2018 08:04:50

## 2018-06-17 ENCOUNTER — Encounter: Payer: Medicare HMO | Admitting: Physician Assistant

## 2018-06-17 DIAGNOSIS — L97312 Non-pressure chronic ulcer of right ankle with fat layer exposed: Secondary | ICD-10-CM | POA: Diagnosis not present

## 2018-06-20 NOTE — Progress Notes (Signed)
[name], [name] ([id number]) Visit Report for 06/17/2018 Arrival Information Details Patient Name: [name], [name]. Date of Service: 06/17/2018 8:00 AM Medical Record Number: [id number] Patient Account Number: [id number] Date of Birth/Sex: 1946-08-22 (72 y.o. M) Treating RN: Curtis Sites Primary Care Kendal Raffo: [name] Other Clinician: Referring Treyshaun Keatts: [name] Treating Jazsmine Macari/Extender: Linwood Dibbles, HOYT Weeks in Treatment: 126 Visit Information History Since Last Visit Added or deleted any medications: No Patient Arrived: Ambulatory Any new allergies or adverse reactions: No Arrival Time: 07:59 Had a fall or experienced change in No Accompanied By: self activities of daily living that may affect Transfer Assistance: Manual risk of falls: Patient Identification Verified: Yes Signs or symptoms of abuse/neglect since last visito No Secondary Verification Process Yes Hospitalized since last visit: No Completed: Implantable device outside of the clinic excluding No Patient Requires Transmission-Based No cellular tissue based products placed in the center Precautions: since last visit: Patient Has Alerts: Yes Has Dressing in Place as Prescribed: Yes Patient Alerts: Patient on Blood Pain Present Now: No Thinner Electronic Signature(s) Signed: 06/17/2018 2:02:18 PM By: Dayton Martes RCP, RRT, CHT Entered By: Dayton Martes on 06/17/2018 08:02:32 [name] ([id number]) -------------------------------------------------------------------------------- Encounter Discharge Information Details Patient Name: [name] Date of Service: 06/17/2018 8:00 AM Medical Record Number: [id number] Patient Account Number: [id number] Date of Birth/Sex: [birth year] (72 y.o. M) Treating RN: Curtis Sites Primary Care Arielis Leonhart: [name] Other Clinician: Referring Chardonnay Holzmann: [name] Treating Alasha Mcguinness/Extender: Linwood Dibbles,  HOYT Weeks in Treatment: 126 Encounter Discharge Information Items Discharge Condition: Stable Ambulatory Status: Ambulatory Discharge Destination: Home Transportation: Private Auto Accompanied By: self Schedule Follow-up Appointment: Yes Clinical Summary of Care: Post Procedure Vitals: Temperature (F): 98.4 Pulse (bpm): 75 Respiratory Rate (breaths/min): 18 Blood Pressure (mmHg): 147/78 Electronic Signature(s) Signed: 06/17/2018 8:46:39 AM By: Curtis Sites Entered By: Curtis Sites on 06/17/2018 08:46:38 [name] ([id number]) -------------------------------------------------------------------------------- Lower Extremity Assessment Details Patient Name: [name] Date of Service: 06/17/2018 8:00 AM Medical Record Number: [id number] Patient Account Number: [id number] Date of Birth/Sex: 01/20/1946 (72 y.o. M) Treating RN: Curtis Sites Primary Care Calil Amor: [name] Other Clinician: Referring Mihira Tozzi: [name] Treating Isella Slatten/Extender: STONE III, HOYT Weeks in Treatment: 126 Edema Assessment Assessed: [Left: No] [Right: No] [Left: Edema] [Right: :] Calf Left: Right: Point of Measurement: 40 cm From Medial Instep cm 35.6 cm Ankle Left: Right: Point of Measurement: 16 cm From Medial Instep cm 21.5 cm Vascular Assessment Pulses: Dorsalis Pedis Palpable: [Right:Yes] Posterior Tibial Extremity colors, hair growth, and conditions: Extremity Color: [Right:Hyperpigmented] Hair Growth on Extremity: [Right:Yes] Temperature of Extremity: [Right:Warm] Capillary Refill: [Right:< 3 seconds] Toe Nail Assessment Left: Right: Thick: Yes Discolored: Yes Deformed: Yes Improper Length and Hygiene: No Electronic Signature(s) Signed: 06/17/2018 5:03:55 PM By: Curtis Sites Entered By: Curtis Sites on 06/17/2018 08:11:06 [name] ([id number]) -------------------------------------------------------------------------------- Multi  Wound Chart Details Patient Name: [name] Date of Service: 06/17/2018 8:00 AM Medical Record Number: [id number] Patient Account Number: [id number] Date of Birth/Sex: 13-Apr-1946 (72 y.o. M) Treating RN: Curtis Sites Primary Care Keane Martelli: [name] Other Clinician: Referring Almando Brawley: [name] Treating Keari Miu/Extender: STONE III, HOYT Weeks in Treatment: 126 Vital Signs Height(in): 76 Pulse(bpm): 75 Weight(lbs): 238 Blood Pressure(mmHg): 147/78 Body Mass Index(BMI): 29 Temperature(F): 98.4 Respiratory Rate 18 (breaths/min): Photos: [1:No Photos] [2:No Photos] [N/A:N/A] Wound Location: [1:Right Malleolus - Lateral] [2:Right Malleolus - Medial] [N/A:N/A] Wounding Event: [1:Gradually Appeared] [2:Gradually Appeared] [N/A:N/A] Primary Etiology: [1:Venous Leg Ulcer] [2:Venous Leg Ulcer] [N/A:N/A] Comorbid History: [1:Cataracts, Chronic Obstructive  Cataracts, Chronic Obstructive N/A Pulmonary Disease (COPD), Pulmonary Disease (COPD), Osteoarthritis] [2:Osteoarthritis] Date Acquired: [1:08/11/2015] [2:01/30/2016] [N/A:N/A] Weeks of Treatment: [1:126] [2:124] [N/A:N/A] Wound Status: [1:Open] [2:Open] [N/A:N/A] Clustered Wound: [1:No] [2:Yes] [N/A:N/A] Measurements L x W x D [1:4.5x3.6x0.2] [2:3x2.4x0.2] [N/A:N/A] (cm) Area (cm) : [1:12.723] [2:5.655] [N/A:N/A] Volume (cm) : [1:2.545] [2:1.131] [N/A:N/A] % Reduction in Area: [1:26.40%] [2:51.00%] [N/A:N/A] % Reduction in Volume: [1:50.90%] [2:2.10%] [N/A:N/A] Classification: [1:Full Thickness Without Exposed Support Structures] [2:Full Thickness Without Exposed Support Structures] [N/A:N/A] Exudate Amount: [1:Medium] [2:Medium] [N/A:N/A] Exudate Type: [1:Serous] [2:Serous] [N/A:N/A] Exudate Color: [1:amber] [2:amber] [N/A:N/A] Wound Margin: [1:Flat and Intact] [2:Flat and Intact] [N/A:N/A] Granulation Amount: [1:Medium (34-66%)] [2:Small (1-33%)] [N/A:N/A] Granulation Quality: [1:Pink] [2:Pink]  [N/A:N/A] Necrotic Amount: [1:Medium (34-66%)] [2:Large (67-100%)] [N/A:N/A] Exposed Structures: [1:Fascia: No Fat Layer (Subcutaneous Tissue) Exposed: No Tendon: No Muscle: No Joint: No Bone: No] [2:Fat Layer (Subcutaneous Tissue) Exposed: Yes Fascia: No Tendon: No Muscle: No Joint: No Bone: No] [N/A:N/A] Epithelialization: [1:N/A] [2:None] [N/A:N/A] Periwound Skin Texture: [1:Excoriation: No Induration: No Callus: No] [2:Excoriation: No Induration: No Callus: No] [N/A:N/A] Crepitus: No Crepitus: No Rash: No Rash: No Scarring: No Scarring: No Periwound Skin Moisture: Maceration: No Maceration: No N/A Dry/Scaly: No Dry/Scaly: No Periwound Skin Color: Hemosiderin Staining: Yes Hemosiderin Staining: Yes N/A Atrophie Blanche: No Atrophie Blanche: No Cyanosis: No Cyanosis: No Ecchymosis: No Ecchymosis: No Erythema: No Erythema: No Mottled: No Mottled: No Pallor: No Pallor: No Rubor: No Rubor: No Temperature: No Abnormality N/A N/A Tenderness on Palpation: No No N/A Wound Preparation: Ulcer Cleansing: Ulcer Cleansing: N/A Rinsed/Irrigated with Saline Rinsed/Irrigated with Saline Topical Anesthetic Applied: Topical Anesthetic Applied: Other: lidocaine 4% Other: lidocaine 4% Treatment Notes Electronic Signature(s) Signed: 06/17/2018 5:03:55 PM By: Curtis Sites Entered By: Curtis Sites on 06/17/2018 08:32:32 [name] ([id number]) -------------------------------------------------------------------------------- Multi-Disciplinary Care Plan Details Patient Name: [name] Date of Service: 06/17/2018 8:00 AM Medical Record Number: [id number] Patient Account Number: [id number] Date of Birth/Sex: 1946/04/20 (72 y.o. M) Treating RN: Curtis Sites Primary Care Fynn Vanblarcom: [name] Other Clinician: Referring Dezyrae Kensinger: [name] Treating Briceyda Abdullah/Extender: STONE III, HOYT Weeks in Treatment: 126 Active Inactive ` Venous Leg Ulcer Nursing  Diagnoses: Knowledge deficit related to disease process and management Goals: Patient will maintain optimal edema control Date Initiated: 04/03/2016 Target Resolution Date: 11/24/2016 Goal Status: Active Interventions: Compression as ordered Treatment Activities: Therapeutic compression applied : 04/03/2016 Notes: ` Wound/Skin Impairment Nursing Diagnoses: Impaired tissue integrity Goals: Ulcer/skin breakdown will heal within 14 weeks Date Initiated: 01/17/2016 Target Resolution Date: 11/24/2016 Goal Status: Active Interventions: Assess ulceration(s) every visit Notes: Electronic Signature(s) Signed: 06/17/2018 5:03:55 PM By: Curtis Sites Entered By: Curtis Sites on 06/17/2018 08:32:24 [name] ([id number]) -------------------------------------------------------------------------------- Pain Assessment Details Patient Name: [name] Date of Service: 06/17/2018 8:00 AM Medical Record Number: [id number] Patient Account Number: [id number] Date of Birth/Sex: 20-Jul-1946 (72 y.o. M) Treating RN: Curtis Sites Primary Care Lisamarie Coke: [name] Other Clinician: Referring Kjuan Seipp: [name] Treating Rafan Sanders/Extender: STONE III, HOYT Weeks in Treatment: 126 Active Problems Location of Pain Severity and Description of Pain Patient Has Paino No Site Locations Pain Management and Medication Current Pain Management: Electronic Signature(s) Signed: 06/17/2018 2:02:18 PM By: Dayton Martes RCP, RRT, CHT Signed: 06/17/2018 5:03:55 PM By: Curtis Sites Entered By: Dayton Martes on 06/17/2018 08:02:42 [name] ([id number]) -------------------------------------------------------------------------------- Patient/Caregiver Education Details Patient Name: [name] Date of Service: 06/17/2018 8:00 AM Medical Record Number: [id number] Patient Account Number: [id number] Date of Birth/Gender: [birth year] (72 y.o.  M) Treating RN: Francesco Sor,  Mardene Celeste Primary Care Physician: [name] Other Clinician: Referring Physician: [name] Treating Physician/Extender: Skeet Simmer in Treatment: 126 Education Assessment Education Provided To: Patient Education Topics Provided Wound/Skin Impairment: Handouts: Other: wound care to continue as ordered Methods: Demonstration, Explain/Verbal Responses: State content correctly Electronic Signature(s) Signed: 06/17/2018 5:03:55 PM By: Curtis Sites Entered By: Curtis Sites on 06/17/2018 08:44:33 [name] ([id number]) -------------------------------------------------------------------------------- Wound Assessment Details Patient Name: [name] Date of Service: 06/17/2018 8:00 AM Medical Record Number: [id number] Patient Account Number: [id number] Date of Birth/Sex: 11-28-1945 (72 y.o. M) Treating RN: Curtis Sites Primary Care Mack Thurmon: [name] Other Clinician: Referring [name] Klingbeil: [name] Treating Amerah Puleo/Extender: STONE III, HOYT Weeks in Treatment: 126 Wound Status Wound Number: 1 Primary Venous Leg Ulcer Etiology: Wound Location: Right Malleolus - Lateral Wound Open Wounding Event: Gradually Appeared Status: Date Acquired: 08/11/2015 Comorbid Cataracts, Chronic Obstructive Pulmonary Weeks Of Treatment: 126 History: Disease (COPD), Osteoarthritis Clustered Wound: No Photos Photo Uploaded By: Francie Massing on 06/18/2018 11:11:36 Wound Measurements Length: (cm) 4.5 Width: (cm) 3.6 Depth: (cm) 0.2 Area: (cm) 12.723 Volume: (cm) 2.545 % Reduction in Area: 26.4% % Reduction in Volume: 50.9% Tunneling: No Undermining: No Wound Description Full Thickness Without Exposed Support Foul Odo Classification: Structures Slough/F Wound Margin: Flat and Intact Exudate Medium Amount: Exudate Type: Serous Exudate Color: amber r After Cleansing: No ibrino Yes Wound Bed Granulation Amount: Medium  (34-66%) Exposed Structure Granulation Quality: Pink Fascia Exposed: No Necrotic Amount: Medium (34-66%) Fat Layer (Subcutaneous Tissue) Exposed: No Necrotic Quality: Adherent Slough Tendon Exposed: No Muscle Exposed: No Joint Exposed: No Bone Exposed: No Yawn, [name] ([id number]) Periwound Skin Texture Texture Color No Abnormalities Noted: No No Abnormalities Noted: No Callus: No Atrophie Blanche: No Crepitus: No Cyanosis: No Excoriation: No Ecchymosis: No Induration: No Erythema: No Rash: No Hemosiderin Staining: Yes Scarring: No Mottled: No Pallor: No Moisture Rubor: No No Abnormalities Noted: No Dry / Scaly: No Temperature / Pain Maceration: No Temperature: No Abnormality Wound Preparation Ulcer Cleansing: Rinsed/Irrigated with Saline Topical Anesthetic Applied: Other: lidocaine 4%, Treatment Notes Wound #1 (Right, Lateral Malleolus) 1. Cleansed with: Clean wound with Normal Saline 2. Anesthetic Topical Lidocaine 4% cream to wound bed prior to debridement 4. Dressing Applied: Hydrafera Blue 5. Secondary Dressing Applied ABD and Kerlix/Conform Electronic Signature(s) Signed: 06/17/2018 5:03:55 PM By: Curtis Sites Entered By: Curtis Sites on 06/17/2018 08:08:54 [name] ([id number]) -------------------------------------------------------------------------------- Wound Assessment Details Patient Name: [name] Date of Service: 06/17/2018 8:00 AM Medical Record Number: [id number] Patient Account Number: [id number] Date of Birth/Sex: January 29, 1946 (71 y.o. M) Treating RN: Curtis Sites Primary Care Kie Calvin: [name] Other Clinician: Referring Jushua Waltman: [name] Treating Briyah Wheelwright/Extender: STONE III, HOYT Weeks in Treatment: 126 Wound Status Wound Number: 2 Primary Venous Leg Ulcer Etiology: Wound Location: Right Malleolus - Medial Wound Open Wounding Event: Gradually Appeared Status: Date Acquired:  01/30/2016 Comorbid Cataracts, Chronic Obstructive Pulmonary Weeks Of Treatment: 124 History: Disease (COPD), Osteoarthritis Clustered Wound: Yes Photos Photo Uploaded By: Francie Massing on 06/18/2018 11:11:36 Wound Measurements Length: (cm) 3 Width: (cm) 2.4 Depth: (cm) 0.2 Area: (cm) 5.655 Volume: (cm) 1.131 % Reduction in Area: 51% % Reduction in Volume: 2.1% Epithelialization: None Tunneling: No Undermining: No Wound Description Full Thickness Without Exposed Support Foul Odo Classification: Structures Slough/F Wound Margin: Flat and Intact Exudate Medium Amount: Exudate Type: Serous Exudate Color: amber r After Cleansing: No ibrino Yes Wound Bed Granulation Amount: Small (1-33%) Exposed Structure Granulation Quality: Pink Fascia Exposed: No Necrotic Amount: Large (  67-100%) Fat Layer (Subcutaneous Tissue) Exposed: Yes Necrotic Quality: Adherent Slough Tendon Exposed: No Muscle Exposed: No Joint Exposed: No Bone Exposed: No [name], [name] ([id number]) Periwound Skin Texture Texture Color No Abnormalities Noted: No No Abnormalities Noted: No Callus: No Atrophie Blanche: No Crepitus: No Cyanosis: No Excoriation: No Ecchymosis: No Induration: No Erythema: No Rash: No Hemosiderin Staining: Yes Scarring: No Mottled: No Pallor: No Moisture Rubor: No No Abnormalities Noted: No Dry / Scaly: No Maceration: No Wound Preparation Ulcer Cleansing: Rinsed/Irrigated with Saline Topical Anesthetic Applied: Other: lidocaine 4%, Treatment Notes Wound #2 (Right, Medial Malleolus) 1. Cleansed with: Clean wound with Normal Saline 2. Anesthetic Topical Lidocaine 4% cream to wound bed prior to debridement 4. Dressing Applied: Hydrafera Blue 5. Secondary Dressing Applied ABD and Kerlix/Conform Electronic Signature(s) Signed: 06/17/2018 5:03:55 PM By: Curtis Sites Entered By: Curtis Sites on 06/17/2018 08:09:37 [name]  ([id number]) -------------------------------------------------------------------------------- Vitals Details Patient Name: [name] Date of Service: 06/17/2018 8:00 AM Medical Record Number: [id number] Patient Account Number: [id number] Date of Birth/Sex: April 25, 1946 (72 y.o. M) Treating RN: Curtis Sites Primary Care Lenola Lockner: [name] Other Clinician: Referring Demeshia Sherburne: [name] Treating Taressa Rauh/Extender: STONE III, HOYT Weeks in Treatment: 126 Vital Signs Time Taken: 08:00 Temperature (F): 98.4 Height (in): 76 Pulse (bpm): 75 Weight (lbs): 238 Respiratory Rate (breaths/min): 18 Body Mass Index (BMI): 29 Blood Pressure (mmHg): 147/78 Reference Range: 80 - 120 mg / dl Electronic Signature(s) Signed: 06/17/2018 2:02:18 PM By: Dayton Martes RCP, RRT, CHT Entered By: Weyman Rodney, Lucio Edward on 06/17/2018 08:05:29

## 2018-06-20 NOTE — Progress Notes (Signed)
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 hurting his wound and since that is been changed he has had no pain  unfortunately the surface of the wound continues to be unhealthy with thick gelatinous slough and nonviable tissue. The wound will not heal like this. 04/20/2016 -- the patient was here for a nurse visit but I was asked to see the patient as the slough was quite significant and the nurse needed for clarification regarding the ointment to be used. 04/24/16; the patient's wounds on the right medial and right lateral ankle/malleolus both look a lot better today. Less adherent slough healthier tissue. Dimensions better especially medially 05/01/16; the patient's wound surface continues to improve however he continues to require debridement switch her easier each week. Continue Santyl/Metahydrin mixture Hydrofera Blue next week. Still drainage on the medial aspect according to the intake nurse 05/08/16; still using Santyl and Medihoney. Still a lot of drainage per her intake nurse. Patient has no complaints pain fever chills etc. 05/15/16 switched the Hydrofera Blue last week. Dimensions down especially in the medial right leg wound. Area on the lateral which is more substantial also looks better still requires debridement 05/22/16; we have been using Hydrofera Blue. 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 Currently he tells me that the pain is doing very well about a 1 out of 10 today. Yesterday was a little bit worse but he tells me that he was more active watering his flowers that day. Overall he feels that his symptoms are doing significantly better at this point in time. His edema continues to be controlled well with the 4-layer compression wrap and he really has not noted any odor at this point in time. He is tolerating the dressing changes when they are performed well. 06/05/16 at this point  in time today patient currently shows no interval signs or symptoms of local or systemic infection. Again his pain level he rates to be a 1 out of 10 at most and overall he tells me that generally this is not giving him much trouble. In fact he even feels maybe a little bit better than last week. We have continue with the 4-layer compression wrap in which she tolerates very well at this point. He is continuing to utilize the [place]. 06/12/16 I think there has been some progression in the status of both of these wounds over today again covered in a gelatinous surface. Has been using Hydrofera Blue. We had used Iodoflex in the past I'm not sure if there was an issue other than changing to something that might progress towards closure faster 06/19/16; he did not tolerate the Flexeril last week secondary to pain and this was changed on Friday back to [facility] area he continues to have copious amounts of gelatinous surface slough which is think inhibiting the speed of healing this area 06/26/16 patient over the last week has utilized the Santyl to try to loosen up some of the tightly adherent slough that was noted on evaluation last week. The good news is he tells me that the medial malleoli region really does not bother him the right llateral malleoli region is more tender to palpation at this point in time especially in the central/inferior location. However it does appear that the Santyl has done his job to loosen up the adherent  slough at this point in time. Fortunately he has no interval signs or symptoms of infection locally or systemically no purulent discharge noted. 07/03/16 at this point in time today patient's wounds appear to be significantly improved over the right medial and lateral malleolus locations. He has much less tenderness at this point in time and the wounds appear clean her although there is still adherent slough this is sufficiently improved over what I saw  last week. I still see no evidence of local infection. 07/10/16; continued gradual improvement in the right medial and lateral malleolus locations. The lateral is more substantial wound now divided into 2 by a rim of normal epithelialization. 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 He denies any complaints of pain or intolerance to compression. Both ulcers are smaller in size oriented today's measurements, both are covered with a softly adherent slough. 07/24/16; medial wound is smaller, lateral about the same although surface looks better. Still using Hydrofera Blue 07/31/16; arrives today complaining of pain in the lateral part of his foot. Nurse reports a lot more drainage. He has been using Hydrofera Blue. Switch to silver alginate today 08/03/2016 -- I was asked to see the patient was here for a nurse visit today. I understand he had a lot of pain in his right lower extremity and was having blisters on his right foot which have not been there before. Though he started on doxycycline he does not have blisters elsewhere on his body. I do not believe this is a drug allergy. also mentioned that there was a copious purulent discharge from the wound and clinically there is no evidence of cellulitis. 08/07/16; I note that the patient came in for his nurse check on Friday apparently with blisters on his toes on the right than a lot of swelling in his forefoot. He continued on the doxycycline that I had prescribed on 12/8. A culture was done of the lateral wound that showed a combination of a few Proteus and Pseudomonas. Doxycycline might of covered the Proteus but would be unlikely to cover the Pseudomonas. He is on Coumadin. He arrives in the clinic today feeling a lot better states the pain is a lot better but nothing specific really was done other than to rewrap the foot also  noted that he had arterial studies ordered in August although these were never done. It is reasonable to go ahead and reorder these. 08/14/16; generally arrives in a better state today in terms of the wounds he has taken cefdinir for one week. Our intake nurse reports copious amounts of drainage but the patient is complaining of much less pain. He is not had his PT and INR checked and I've asked him to do this today or tomorrow. 08/24/2016 -- patient arrives today after 10 days and said he had a stomach upset. His arterial study was done and I have reviewed this report and find it to be within normal limits. However I did not note any venous duplex studies for reflux, and Dr. Leanord Hawking may have ordered these in the past but I will leave it to him to decide if he needs these. The patient has finished his course of cefdinir. 08/28/16; patient arrives today again with copious amounts of thick really green drainage for our intake nurse. He states he has a very tender spot at the superior part of the lateral wound. Wounds are larger 09/04/16; no  real change in the condition of this patient's wound still copious amounts of surface slough. Started him on Iodoflex last week he is completing another course of Cefdinir or which I think was done empirically. His arterial study showed ABIs were 1.1 on the right 1.5 on the left. He did have a slightly reduced ABI in the right the left one was not obtained. Had calcification of the right posterior tibial artery. The interpretation was no segmental stenosis. His waveforms were triphasic. CANNON, ARREOLA ([id number]) His reflux studies are later this month. Depending on this I'll send him for a vascular consultation, he may need to see plastic surgery as I believe he is had plastic surgery on this foot in the past. He had an injury to the foot in the 1980s. 1/16 /18 right lateral greater than right medial ankle wounds on the right in the setting of previous skin  grafting. 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 He does not have arterial issues. Each week he comes in with the same adherent surface slough although there was less of this today 09/18/16; right lateral greater than right medial lower extremity wounds in the setting of previous skin grafting and trauma. He has least to vein laser ablation scheduled for February 2 for venous reflux. He does not have significant arterial disease. Problem has been very difficult to handle surface slough/necrotic tissue. Recently using Iodoflex for this with some, albeit slow improvement 09/25/16; right lateral greater than right medial lower extremity venous wounds in the setting of previous skin grafting. He is going for ablation surgery on February 2 after this he'll come back here for rewrap. He has been using Iodoflex as the primary dressing. 10/02/16; right lateral greater than right medial lower extremity wounds in the setting of previous skin grafting. He had his ablation surgery last week, I don't have a report. He tolerated this well. Came in with a thigh-high Unna boots on Friday. We have been using Iodoflex as the primary dressing. His measurements are improving 10/09/16; continues to make nice aggressive in terms of the wounds on his lateral and medial right ankle in the setting of previous skin grafting. Yesterday he noticed drainage at one of his surgical sites from his venous ablation on the right calf. He took off the bandage over this area felt a "popping" sensation and a reddish-brown drainage. He is not complaining of any pain 10/16/16; he continues to make nice progression in terms of the wounds on the lateral and medial malleolus. Both smaller using Iodoflex. He had a surgical area in his posterior mid calf we have been using iodoform. All the wounds are down and dimensions 10/23/16; the patient arrives today with  no complaints. He states the Iodoflex is a bit uncomfortable. He is not systemically unwell. We have been using Iodoflex to the lateral right ankle and the medial and Aquacel Ag to the reflux surgical wound on the posterior right calf. All of these wounds are doing well 10/30/16; patient states he has no pain no systemic symptoms. I changed him to [facility] last week. Although the wounds are doing well 11/06/16; patient reports no pain or systemic symptoms. We continue with Hydrofera Blue. Both wound areas on the medial and lateral ankle appear to be doing well with improvement and dimensions and improvement in the wound bed. 11/13/16; patient's dimensions continued to improve. We continue with Hydrofera  Blue on the medial and lateral side. Appear to be doing well with healthy granulation and advancing epithelialization 11/20/16; patient's dimensions improving laterally by about half a centimeter in length. Otherwise no change on the medial side. Using [facility] 12/04/16; no major change in patient's wound dimensions. Intake nurse reports more drainage. 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 He is voicing no complaints or concerns. He has been tolerating his twice weekly compression therapy changes 12/11/16 Patient complains of pain and increased drainage.. wants hydrofera blue 12/18/16 improvement. Sorbact 12/25/16; medial wound is smaller, lateral measures the same. Still on sorbact 01/01/17; medial wound continues to be smaller, lateral measures about the same however there is clearly advancing epithelialization here as well in fact I think the wound will ultimately divided into 2 open areas 01/08/17; unfortunately today fairly significant regression in several areas. Surface of the lateral wound covered again in adherent necrotic material which is difficult to debridement. He has  significant surrounding skin maceration. The expanding area of tissue epithelialization in the middle of the wound that was encouraging last week appears to be smaller. There is no surrounding tenderness. The area on the medial leg also did not seem to be as healthy as last week, the reason for this regression this week is not totally clear. We have been using Sorbact for the last 4 weeks. We'll switched of polymen AG which we will order via home medical supply. If there is a problem with this would switch back to Iodoflex 01/15/18; drainage,odor. No change. Switched to polymen last week 01/22/17; still continuous drainage. Culture I did last week showed a few Proteus pansensitive. I did this culture because of drainage. Put him on Augmentin which she has been taking since Saturday however he is developed 4-5 liquid bowel movements. He is also on Coumadin. Beyond this wound is not changed at all, still nonviable necrotic surface material which I debrided reveals healthy granulation line 01/29/17; still copious amounts of drainage reported by her intake nurse. Wound measuring slightly smaller. Currently fact on Iodoflex although I'm looking forward to changing back to perhaps [facility] or polymen AG 02/05/17; still large amounts of drainage and presenting with really large amounts of adherent slough and necrotic material over the remaining open area of the wound. We have been using Iodoflex but with little improvement in the surface. Change to [facility] 02/12/17; still large amount of drainage. Much less adherent slough however. Started [employer]	[place] last week JAYR, LUPERCIO ([id number]) 02/19/17; drainage is better this week. Much less adherent slough. Perhaps some improvement in dimensions. Using [facility] 02/26/17; severe venous insufficiency wounds on the right lateral and right medial leg. Drainage is some better and slough is less adherent we've been using Hydrofera Blue 03/05/17  on evaluation today patient appears to be doing well. His wounds have been decreasing in size and overall he is pleased with how this is progressing. We are awaiting approval for the epigraph which has previously been recommended in the meantime the [facility] Dressing is to be doing very well for him. 03/12/17; wound dimensions are smaller still using Hydrofera Blue. Comes in on Fridays for a dressing change. 03/19/17; wound dimensions continued to contract. Healing of this wound is complicated by continuous significant drainage as well as recurrent buildup of necrotic surface material. We looked into Apligraf and he has a $290 co-pay per application but truthfully I  think the drainage as well as the nonviable surface would preclude use of Apligraf there are any other skin substitute at this point therefore the continued plan will be debridement each clinic visit, 2 times a week dressing changes and continued use of Hydrofera Blue. improvement has been very slow but sustained 03/26/17 perhaps slight improvements in peripheral epithelialization is especially inferiorly. Still with large amount of drainage and tightly adherent necrotic surface on arrival. Along with the intake nurse I reviewed previous treatment. He worsened on Iodoflex and had a 4 week trial of sorbact, Polymen AG and a long courses of Aquacel Ag. He is not a candidate for advanced treatment options for many reasons 04/09/17 on evaluation today patient's right lower extremity wounds appear to be doing a little better. Fortunately he has no significant discomfort and has been tolerating the dressing changes including the wraps without complication. With that being said he really does not have swelling anymore compared to what he has had in the past following his vascular intervention. He wonders if potentially we could attempt avoiding the rats to see if he could cleanse the wound in between to try to prevent some of the fiber and  its buildup that occurs in the interim between when we see him week to week. No fevers, chills, nausea, or vomiting noted at this time. 04/16/17; noted that the staff made a choice last week not to put him in compression. The patient is changing his dressing at home using [facility] and changing every second day. His wife is washing the wound with saline. He is using kerlix. Surprisingly he has not developed a lot of edema. This choice was made because of the degree of fluid retention and maceration even with changing his dressing twice a week 04/23/17; absolutely no change. Using Hydrofera Blue. Recurrent tightly adherent nonviable surface material. This is been refractory to Iodoflex, sorbact and now Hydrofera Blue. More recently he has been changing his own dressings at home, cleansing the wound in the shower. He has not developed lower extremity edema 04/30/17; if anything the wound is larger still in adherent surface although it debrided easily today. I've been using Mehta honey for 1 week and I'd like to try and do it for a second week this see if we can get some form of viable surface here. 05/07/17; patient arrives with copious amounts of drainage and some pain in the superior part of the wound. He has not been systemically unwell. He's been changing this daily at home 05/14/17; patient arrives today complaining of less drainage and less pain. Dimensions slightly better. Surface culture I did of this last week grew MSSA I put him on dicloxacillin for 10 days. Patient requesting a further prescription since he feels so much better. He did not obtain the [facility] AG for reasons that aren't clear, he has been using Hydrofera Blue 05/21/17; perhaps some less drainage and less pain. He is completing another week of doxycycline. Unfortunately there is no change in the wound measurements are appearance still tightly adherent necrotic surface material that is really defied treatment. We have been  using Hydrofera Blue most recently. He did not manage to get Anasept. He was told it was prescription at [facility] 05/29/17; absolutely no improvement in these wound areas. He had remote plastic surgery with who was done at [facility] in [place] surrounding this area. This was a traumatic wound with extensive plastic surgery/skin grafting at that time. I switched him to sorbact last week to see  if he can do anything about the recurrent necrotic surface and drainage. This is largely been refractory to Iodoflex/Hydrofera Blue/alginates. I believe we tried Elby Showers and more recently Medi honey l however the drainage is really too excessive 06/04/17; patient went to see Dr. Ulice Bold. Per the patient she ordered him a compression stocking. Continued the Anasept gel and sorbact was already on. No major improvement in the condition of the wounds in fact the medial wound looks larger. Again per the patient she did not feel a skin graft or operative debridement was indicated The patient had previous venous ablation in February 2018. Last arterial studies were in December 2017 and were within normal limits 06/18/17; patient arrives back in clinic today using Anasept gel with sorbact. He changes this every day is wearing his own compression stocking. The patient actually saw Dr. Leta Baptist of plastic surgery. I've reviewed her note. The appointment was on 05/30/17. The basic issue with here was that she did not feel that skin grafts for patients with underlying stasis and edema have a good long-term success rate. She also discuss skin substitutes which has been done here as well however I have not been able to get the surface of these wounds to something that I think would support skin substitutes. This is why he felt he might need an SASHA, RUETH. ([id number]) operative debridement more aggressively than I can do in this clinic Review of systems; is otherwise negative he feels well 07/02/17; patient  has been using Anasept gel with Sorbact. He changes this every day scrubs out the wound beds. Arrives today with the wound bed looking some better. Easier debridement 07/16/17; patient has been using Anasept gel was sorbact for about a month now. The necrotic service on his wound bed is better and the drainage is now down to minimal but we've not made any improvements in the epithelialization. Change to Santyl today. Surface of the wound is still not good enough to support skin substitutes 07/30/17 on evaluation today patient appears to be doing very well in regard to his right bimalleolus wounds. He has been tolerating the treatment with the Anasept gel and sorbact. However we were going to try and switch to Santyl to be more aggressive with these wounds. This was however cost prohibitive. Therefore we will likely go back to the sorbact. Nonetheless he is not having any significant discomfort he still is forming slough over the wound location but nothing too significant. The wounds do appear to be filling in nicely. 08/13/17; I have not seen this wound in about a month. There is not much change. The fact the area medially looks larger. He has been using sorbact and Anasept for over a month now without much effect. Arrives in clinic stating that he does not want the area debrided 08/28/17; patient arrives with the areas on his right medial and right lateral ankle worse. The wounds of expanded there is erythema and still drainage. There is no pain and no tenderness. We had been using Keracel AG clearly not doing well. 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 He states that even the evening that we began utilizing the Iodoflex he started  having burning. Subsequently this never really improved and  he eventually discontinue the use of the Iodoflex altogether. Unfortunately he did not let us know about any of this until today. I'm unsure of any specific infection at this point although I am going to obtain a wound culture today in order to see if there's anything that could potentially be causing an infection as well. It does sound as if he's had the Iodoflex before and did not have this kind of reaction although this does sound very specific for a reaction to the Iodoflex. 09/17/17 on evaluation today patient appears to be doing significantly better compared to last week's evaluation. At that time it actually appears that he did have an infection the good news is that we have been able to get this under control with switching to the [facility] solution he's not having as much pain and discomfort he still does have some erythema surrounding the medial aspect wound. He has been tolerating the Dakin's soaked gauze dressing which is excellent and that his wounds do not seem to have as much adherent slough. Overall I'm pleased with the progress she's made in last week. 11/05/17 on evaluation today patient appears to be doing better in regard to his right medial and lateral malleolus ulcers. He has been tolerating the dressing changes without complication. I do flight the Freada Bergeron is helping with additional granulation at this point in the wound beds do appear to be a little bit better. With that being said patient does not appear to have any evidence of significant infection at this point there is no erythema as he has previously noted he does note that the 30 day course of antibiotics I placed him on will end tomorrow. 11/12/17 on evaluation today patient actually appears to be doing excellent in regard to his right medial and lateral malleolus ulcers. He has been tolerating the dressing changes without complication. Fortunately he has no evidence  of infection at this time and overall I believe he is progressing extremely nicely he has a lot of new epithelialization noted on evaluation today. Patient likewise is very pleased with the progress even just since last week. 11/19/17 on evaluation today patient appears to be doing about the same in regard to his ulcerations. He does not have any additional breakdown although he really has not noted any significant improvement either over the past week. With that being said the more concerning thing at this time is that he is beginning to show signs of erythema surrounding both wounds which is what typically happens and then he will develop into a more overt infection. This has been the course for some time. I hope that doing the 30 day in a biotic cycle this past time would break that so that he would not have the same type of thing happen again. Nonetheless it appears that is digressing back into this infected state unfortunately. With that being said he is not having any pain currently which is good he typically does start to hurt more as this progresses. Fortunately there does not appear to be any evidence of infection systemically which is good news. 11/26/17 on evaluation today patient appears to actually be doing rather well in regard to his ulcer compared to last week. He still has your theme and noted around both the medial and lateral malleolus ulcers of the right lower extremity. He does not seem to be quite as overtly infected however. He has been tolerating the dressing changes without complication which is good news. The gentamicin cream does seem to  have been of benefit for him. With that being said he does actually have an appointment with Dr. Sampson Goon this morning to see if there's anything that would be recommended in the way of IV antibiotic Fleishman, Leiby C. ([id number]) therapy. Otherwise he still has slough noted on the surface of the wound. 12/03/17 on evaluation today patient  presents for follow-up concerning his ongoing right lower extremity ulcers. He has been tolerating the dressing changes without complication he states he has not really been using gentamicin on the lateral ankle and this seems to be doing very well. For that reason I think that is probably fine to put a hold on the gentamicin he states his burns and stings when he applies it and we maybe be able to just use this intermittently when things become more irritated. Other than that the good news is I did review his MRI which was performed on 11/28/17 and revealed that he has a negative MRI for abscess, osteomyelitis, or septic joint. Obviously these were the issues that I was concerned with the possibility of and I'm pleased to find that there is no problems in that regard. I did also review Dr. Jarrett Ables note from infectious disease he discussed performed some lab work which apparently was done and then subsequently was going to consider the possibility of Keflex long-term for prevention of infection although this has not been prescribed as of yet. 12/09/17 on evaluation today patient appears to be doing a little bit worse in regard to his right medial and lateral malleolus ulcers. Fortunately the wound does not seem to be significantly worse although he does have a lot more drainage especially the lateral aspect he is having a lot more pain than what he has noted more recently. Fortunately there does not appear to be any evidence of systemic infection which is good news. Again he has seen Dr. Sampson Goon but he has not been placed on the potential Keflex which was recommended as a possibility at least yet. 12/17/17 unfortunately on evaluation today the patient's wound appears to be doing much worse. He has been performing the dressing changes as directed. Upon a review of notes in epic it does appear that Dr. Jarrett Ables office specifically sending Keflex for the patient on the 16th which was last  Tuesday. With that being said the patient states that though this was sent into [facility] that [facility] stated they never received it and subsequently the patient never took anything. He tells me he has been in bed since Friday due to the increased pain and overall general malaise. No fevers, chills, nausea, or vomiting noted at this time. 02/25/18 on evaluation today patient appears to be doing rather well in regard to his bilateral right malleolus ulcers. He has been tolerating the dressing changes without complication. Currently there does not appear to be any evidence of infection. Overall I'm very pleased with the progress that seems to been made up to this point. 03/11/18 on evaluation today patient actually appears to be doing very well in regard to his right ankle ulcers. He's been tolerating the dressing changes without complication. Fortunately there does not appear to be any evidence of sniffing infection I do believe that the gentamicin cream continues to be of benefit for him. Fortunately he is showing signs of healing in which time I see him. He also tells me he's having no pain. 03/25/18 on evaluation today patient actually appears to show signs of improvement especially in regard to the right lateral ankle ulcer.  He has been tolerating the dressing changes without complication. In general I'm very pleased with the progress that has been made up to this point. 04/08/18- He presents in follow up evaluation for right bimalleolar wounds; there is essentially no change. He admits to removing the dressing at night to allow the wounds to be "air out". We will continue with same treatment plan and he will follow up in two weeks 04/22/18 on evaluation today patient seems very frustrated with this situation currently as far as the ulcer on his right medial and lateral malleolus is concerned. He states this has been going on a very long time and obviously he does have a lot of bills as well is far as  wound care is concerned. With that being said he tells me that he's been tolerating the dressing changes well although he did clarify that what he is doing is putting the medicine on in the morning after shower and then subsequently he takes the dressing off at night leaving it open to air. I previously asked him about this and I was not aware that he was doing this. Nonetheless that may be slowing things down a bit at this point. Fortunately there does not appear to be any evidence of severe infection although he does have some evidence of your theme is surrounding the wound bed. He is still using the gentamicin cream. 05/06/18 on evaluation today patient actually presents for evaluation of his right medial malleolus ulcers. He's been tolerating the dressing changes without complication. With that being said it does appear that he is doing better compared to last evaluation. He's not having the pain like he did previous. He also states that blisters that he had coming up when I saw him last did resolve and ended up doing okay he did go to the ER they gave him some cream to put on it he's not sure what the name of the cream was. He was down in [facility] when he called me and I spoke with him previous. Nonetheless he states that they told him to put the medicine on his our due list. With that being said I'm not really 100% sure that the medicine had anything to do with this due to the fact that again he was having the blisters when he came into the office which is why we actually put them on the anabiotic I was concerned about him having an infection which was causing the blistering and increased pain in his extremity. Nonetheless this is something that we can definitely be cautious of in the future it was Bactrim Friede, Gadiel C. ([id number]) that we had him on. Overall I'm glad he is doing better. 05/20/18 on evaluation today patient actually appears to be doing fairly well in regard to his  ulcer on the right lateral and medial malleolus locations. Both seem to be doing decently well he does have slough building up on the surface of the wound. He has been using the Hibiclense as I instructed him. Overall there's no evidence of significant infection which is good news. 06/02/18 on evaluation today patient actually appears to be doing excellent in regard to his right medial and lateral malleolus ulcers. He has been tolerating the dressing changes without complication. There does not appear to be any evidence of infection at this time which is great news. In general I feel like he has made great progress in the past several weeks. He has good granulation noted over areas that have not  had good granulation previously and epithelialization even more so in several areas. Both wounds measured smaller. 06/17/18 an evaluation today patient actually appears to be doing excellent in regard to his right lateral and medial malleolus ulcers. He has been tolerating the dressing changes without complication fortunately the Hydrofera Blue Dressing to be doing excellent for him. He's actually making progress each time I see him. Patient History Information obtained from Patient. Social History Former smoker - 10 years ago, Marital Status - Married, Alcohol Use - Moderate, Drug Use - No History, Caffeine Use - Never. Medical And Surgical History Notes Constitutional Symptoms (General Health) Vein Filter (groin area); CODA; H/O Blood Clots; pulmonary hypertensive arterial disease Review of Systems (ROS) Constitutional Symptoms (General Health) Denies complaints or symptoms of Fever, Chills. Respiratory The patient has no complaints or symptoms. Cardiovascular The patient has no complaints or symptoms. Psychiatric The patient has no complaints or symptoms. Objective Constitutional Well-nourished and well-hydrated in no acute distress. Vitals Time Taken: 8:00 AM, Height: 76 in, Weight: 238 lbs,  BMI: 29, Temperature: 98.4 F, Pulse: 75 bpm, Respiratory Rate: 18 breaths/min, Blood Pressure: 147/78 mmHg. Respiratory normal breathing without difficulty. Psychiatric [name], KERNER ([id number]) this patient is able to make decisions and demonstrates good insight into disease process. Alert and Oriented x 3. pleasant and cooperative. General Notes: patient's wound bed currently did show some Slough noted on the surface of the wound which did require some sharp debridement today. He tolerated this actually without complication post debridement the wound bed appears to be doing Significantly better. Fortunately the patient's wound is showing no signs of infection and post debridement appears to be doing even better. Integumentary (Hair, Skin) Wound #1 status is Open. Original cause of wound was Gradually Appeared. The wound is located on the Right,Lateral Malleolus. The wound measures 4.5cm length x 3.6cm width x 0.2cm depth; 12.723cm^2 area and 2.545cm^3 volume. There is no tunneling or undermining noted. There is a medium amount of serous drainage noted. The wound margin is flat and intact. There is medium (34-66%) pink granulation within the wound bed. There is a medium (34-66%) amount of necrotic tissue within the wound bed including Adherent Slough. The periwound skin appearance exhibited: Hemosiderin Staining. The periwound skin appearance did not exhibit: Callus, Crepitus, Excoriation, Induration, Rash, Scarring, Dry/Scaly, Maceration, Atrophie Blanche, Cyanosis, Ecchymosis, Mottled, Pallor, Rubor, Erythema. Periwound temperature was noted as No Abnormality. Wound #2 status is Open. Original cause of wound was Gradually Appeared. The wound is located on the Right,Medial Malleolus. The wound measures 3cm length x 2.4cm width x 0.2cm depth; 5.655cm^2 area and 1.131cm^3 volume. There is Fat Layer (Subcutaneous Tissue) Exposed exposed. There is no tunneling or undermining noted.  There is a medium amount of serous drainage noted. The wound margin is flat and intact. There is small (1-33%) pink granulation within the wound bed. There is a large (67-100%) amount of necrotic tissue within the wound bed including Adherent Slough. The periwound skin appearance exhibited: Hemosiderin Staining. The periwound skin appearance did not exhibit: Callus, Crepitus, Excoriation, Induration, Rash, Scarring, Dry/Scaly, Maceration, Atrophie Blanche, Cyanosis, Ecchymosis, Mottled, Pallor, Rubor, Erythema. 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 There was a Excisional Skin/Subcutaneous Tissue Debridement with a total area of 16.2 sq cm performed by STONE III, HOYT E., PA-C. With the following instrument(s): Curette to remove Viable and Non-Viable tissue/material. Material removed includes Subcutaneous Tissue and Slough and after achieving pain control using Lidocaine 4% Topical Solution. No specimens were taken. A time out was conducted at 08:31, prior to the start of the procedure. A Minimum amount of bleeding was controlled with Pressure. The procedure was tolerated well with a pain level of 0 throughout and a pain level of 0 following the procedure. Post Debridement Measurements: 4.5cm length x 3.6cm width x 0.3cm depth; 3.817cm^3 volume. Character of Wound/Ulcer Post Debridement is improved. Severity of Tissue Post Debridement is: Fat layer exposed. JOANTHAN, HLAVACEK ([id number]) Post  procedure Diagnosis Wound #1: Same as Pre-Procedure Wound #2 Pre-procedure diagnosis of Wound #2 is a Venous Leg Ulcer located on the Right,Medial Malleolus .Severity of Tissue Pre Debridement is: Fat layer exposed. There was a Excisional Skin/Subcutaneous Tissue Debridement with a total area of 7.2 sq cm performed by STONE III, HOYT E., PA-C. With the following instrument(s): Curette to remove Viable and Non-Viable tissue/material. Material removed includes Subcutaneous Tissue and Slough and after achieving pain control using Lidocaine 4% Topical Solution. No specimens were taken. A time out was conducted at 08:35, prior to the start of the procedure. A Minimum amount of bleeding was controlled with Pressure. The procedure was tolerated well with a pain level of 0 throughout and a pain level of 0 following the procedure. Post Debridement Measurements: 3cm length x 2.4cm width x 0.3cm depth; 1.696cm^3 volume. Character of Wound/Ulcer Post Debridement is improved. Severity of Tissue Post Debridement is: Fat layer exposed. Post procedure Diagnosis Wound #2: Same as Pre-Procedure Plan Wound Cleansing: Wound #1 Right,Lateral Malleolus: Clean wound with Normal Saline. May Shower, gently pat wound dry prior to applying new dressing. - wash with hibiclens daily and leave on 5 mins Wound #2 Right,Medial Malleolus: Clean wound with Normal Saline. May Shower, gently pat wound dry prior to applying new dressing. - wash with hibiclens and leave on 5 mins Anesthetic (add to Medication List): Wound #1 Right,Lateral Malleolus: Topical Lidocaine 4% cream applied to wound bed prior to debridement (In Clinic Only). Wound #2 Right,Medial Malleolus: Topical Lidocaine 4% cream applied to wound bed prior to debridement (In Clinic Only). Primary Wound Dressing: Wound #1 Right,Lateral Malleolus: Hydrafera Blue Ready Transfer - use hydrafera blue classic in the home and moisten with saline Wound #2 Right,Medial  Malleolus: Hydrafera Blue Ready Transfer - use hydrafera blue classic in the home and moisten with saline Secondary Dressing: Wound #1 Right,Lateral Malleolus: Dry Gauze Conform/Kerlix Wound #2 Right,Medial Malleolus: Dry Gauze Conform/Kerlix Dressing Change Frequency: Wound #1 Right,Lateral Malleolus: Change dressing every other day. Wound #2 Right,Medial Malleolus: Change dressing every other day. Follow-up Appointments: Wound #1 Right,Lateral Malleolus: Return Appointment in 2 weeks. Wound #2 Right,Medial Malleolus: Return Appointment in 2 weeks. Edema Control: Wound #1 Right,Lateral Malleolus: Elevate legs to the level of the heart and pump ankles as often as possible Terwilliger, Colden C. ([id number]) Wound #2 Right,Medial Malleolus: Elevate legs to the level of the heart and pump ankles as often as possible Additional Orders / Instructions: Wound #1 Right,Lateral Malleolus: Increase protein intake. Wound #2 Right,Medial Malleolus: Increase protein intake. At this time my suggestion is going to be that we initiate the above wound care measures for the next week. The patient is in  agreement with plan this would be a continuation of what we been doing which seems to be doing excellent for him. If anything changes or worsens the meantime he will contact the office and let me know. Please see above for specific wound care orders. We will see patient for re-evaluation in 2 week(s) here in the clinic. If anything worsens or changes patient will contact our office for additional recommendations. Electronic Signature(s) Signed: 06/19/2018 1:45:00 AM By: Lenda Kelp PA-C Entered By: Lenda Kelp on 06/17/2018 08:49:20 [name] ([id number]) -------------------------------------------------------------------------------- ROS/PFSH Details Patient Name: [name] Date of Service: 06/17/2018 8:00 AM Medical Record Number: [id number] Patient Account Number:  [id number] Date of Birth/Sex: 11-26-1945 (72 y.o. M) Treating RN: Curtis Sites Primary Care Provider: [name] Other Clinician: Referring Provider: [name] Treating Provider/Extender: STONE III, HOYT Weeks in Treatment: 126 Information Obtained From Patient Wound History Do you currently have one or more open woundso Yes How many open wounds do you currently haveo 1 Approximately how long have you had your woundso 5 months How have you been treating your wound(s) until nowo saline, dressing Has your wound(s) ever healed and then re-openedo Yes Have you had any lab work done in the past montho No Have you tested positive for an antibiotic resistant organism (MRSA, VRE)o No Have you tested positive for osteomyelitis (bone infection)o No Have you had any tests for circulation on your legso No Constitutional Symptoms (General Health) Complaints and Symptoms: Negative for: Fever; Chills Medical History: Past Medical History Notes: Vein Filter (groin area); CODA; H/O Blood Clots; pulmonary hypertensive arterial disease Eyes Medical History: Positive for: Cataracts - removed Negative for: Glaucoma; Optic Neuritis Ear/Nose/Mouth/Throat Medical History: Negative for: Chronic sinus problems/congestion Hematologic/Lymphatic Medical History: Negative for: Anemia; Hemophilia; Human Immunodeficiency Virus; Lymphedema; Sickle Cell Disease Respiratory Complaints and Symptoms: No Complaints or Symptoms Medical History: Positive for: Chronic Obstructive Pulmonary Disease (COPD) Negative for: Aspiration; Asthma; Pneumothorax; Sleep Apnea; Tuberculosis Cardiovascular RITHY, MANDLEY. ([id number]) Complaints and Symptoms: No Complaints or Symptoms Medical History: Negative for: Angina; Arrhythmia; Congestive Heart Failure; Coronary Artery Disease; Deep Vein Thrombosis; Hypertension; Hypotension; Myocardial Infarction; Peripheral Arterial Disease; Peripheral Venous Disease;  Phlebitis; Vasculitis Gastrointestinal Medical History: Negative for: Cirrhosis ; Colitis; Crohnos; Hepatitis A; Hepatitis B; Hepatitis C Endocrine Medical History: Negative for: Type I Diabetes; Type II Diabetes Genitourinary Medical History: Negative for: End Stage Renal Disease Immunological Medical History: Negative for: Lupus Erythematosus; Raynaudos Integumentary (Skin) Medical History: Negative for: History of Burn; History of pressure wounds Musculoskeletal Medical History: Positive for: Osteoarthritis Negative for: Gout; Rheumatoid Arthritis; Osteomyelitis Neurologic Medical History: Negative for: Dementia; Neuropathy; Quadriplegia; Paraplegia; Seizure Disorder Oncologic Medical History: Negative for: Received Chemotherapy; Received Radiation Psychiatric Complaints and Symptoms: No Complaints or Symptoms Medical History: Negative for: Anorexia/bulimia; Confinement Anxiety HBO Extended History Items KEMARION, ABBEY. ([id number]) Eyes: Cataracts Immunizations Pneumococcal Vaccine: Received Pneumococcal Vaccination: No Implantable Devices Family and Social History Former smoker - 10 years ago; Marital Status - Married; Alcohol Use: Moderate; Drug Use: No History; Caffeine Use: Never; Advanced Directives: No; Patient does not want information on Advanced Directives; Living Will: No; Medical Power of Attorney: No Physician Affirmation I have reviewed and agree with the above information. Electronic Signature(s) Signed: 06/17/2018 5:03:55 PM By: Curtis Sites Signed: 06/19/2018 1:45:00 AM By: Lenda Kelp PA-C Entered By: Lenda Kelp on 06/17/2018 08:48:07 [name] ([id number]) -------------------------------------------------------------------------------- SuperBill Details Patient Name: [name] Date of Service: 06/17/2018 Medical Record Number: [id number] Patient Account  Number: [id number] Date of Birth/Sex: [birth year] (72  y.o. 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, sequela T81.31XS Disruption of external operation (surgical) wound, not elsewhere classified, sequela Facility Procedures CPT4 Code: 78295621 Description: 11042 - DEB SUBQ TISSUE 20 SQ CM/< ICD-10 Diagnosis Description L97.312 Non-pressure chronic ulcer of right ankle with fat layer ex Modifier: posed Quantity: 1 CPT4 Code: 30865784 Description: 11045 - DEB SUBQ TISS EA ADDL 20CM ICD-10 Diagnosis Description L97.312 Non-pressure chronic ulcer of right ankle with fat layer ex Modifier: posed Quantity: 1 Physician Procedures CPT4 Code: 6962952 Description: 11042 - WC PHYS SUBQ TISS 20 SQ CM ICD-10 Diagnosis Description L97.312 Non-pressure chronic ulcer of right ankle with fat layer exp Modifier: osed Quantity: 1 CPT4 Code: 8413244 Description: 11045 - WC PHYS SUBQ TISS EA ADDL 20 CM ICD-10 Diagnosis Description L97.312 Non-pressure chronic ulcer of right ankle with fat layer exp Modifier: osed Quantity: 1 Electronic Signature(s) Signed: 06/19/2018 1:45:00 AM By: Lenda Kelp PA-C Entered By: Lenda Kelp on 06/17/2018 08:49:49

## 2018-07-01 ENCOUNTER — Ambulatory Visit: Payer: Medicare HMO | Admitting: Physician Assistant

## 2018-07-08 ENCOUNTER — Encounter: Payer: Medicare HMO | Attending: Physician Assistant | Admitting: Physician Assistant

## 2018-07-08 DIAGNOSIS — Z87891 Personal history of nicotine dependence: Secondary | ICD-10-CM | POA: Diagnosis not present

## 2018-07-08 DIAGNOSIS — Z7901 Long term (current) use of anticoagulants: Secondary | ICD-10-CM | POA: Diagnosis not present

## 2018-07-08 DIAGNOSIS — J449 Chronic obstructive pulmonary disease, unspecified: Secondary | ICD-10-CM | POA: Insufficient documentation

## 2018-07-08 DIAGNOSIS — L97312 Non-pressure chronic ulcer of right ankle with fat layer exposed: Secondary | ICD-10-CM | POA: Insufficient documentation

## 2018-07-08 DIAGNOSIS — I87331 Chronic venous hypertension (idiopathic) with ulcer and inflammation of right lower extremity: Secondary | ICD-10-CM | POA: Insufficient documentation

## 2018-07-08 DIAGNOSIS — Z86718 Personal history of other venous thrombosis and embolism: Secondary | ICD-10-CM | POA: Diagnosis not present

## 2018-07-08 DIAGNOSIS — I872 Venous insufficiency (chronic) (peripheral): Secondary | ICD-10-CM | POA: Diagnosis not present

## 2018-07-10 NOTE — Progress Notes (Signed)
[name] ([id number]) Visit Report for 07/08/2018 Arrival Information Details Patient Name: [name], [name]. Date of Service: 07/08/2018 8:15 AM Medical Record Number: [id number] Patient Account Number: [id number] Date of Birth/Sex: [birth year] (72 y.o. M) Treating RN: Curtis Sites Primary Care Camrynn Mcclintic: [name] Other Clinician: Referring Leeon Makar: [name] Treating Haileyann Staiger/Extender: Linwood Dibbles, HOYT Weeks in Treatment: 129 Visit Information History Since Last Visit Added or deleted any medications: No Patient Arrived: Ambulatory Any new allergies or adverse reactions: No Arrival Time: 08:22 Had a fall or experienced change in No Accompanied By: self activities of daily living that may affect Transfer Assistance: None risk of falls: Patient Identification Verified: Yes Signs or symptoms of abuse/neglect since last visito No Secondary Verification Process Yes Hospitalized since last visit: No Completed: Implantable device outside of the clinic excluding No Patient Requires Transmission-Based No cellular tissue based products placed in the center Precautions: since last visit: Patient Has Alerts: Yes Has Dressing in Place as Prescribed: Yes Patient Alerts: Patient on Blood Pain Present Now: No Thinner Electronic Signature(s) Signed: 07/08/2018 2:49:09 PM By: Dayton Martes RCP, RRT, CHT Entered By: Dayton Martes on 07/08/2018 08:23:22 [name] ([id number]) -------------------------------------------------------------------------------- Encounter Discharge Information Details Patient Name: [name] Date of Service: 07/08/2018 8:15 AM Medical Record Number: [id number] Patient Account Number: [id number] Date of Birth/Sex: 04/22/1946 (72 y.o. M) Treating RN: Curtis Sites Primary Care Mariya Mottley: [name] Other Clinician: Referring Thoren Hosang: [name] Treating Ronniesha Seibold/Extender: Linwood Dibbles,  HOYT Weeks in Treatment: 129 Encounter Discharge Information Items Post Procedure Vitals Discharge Condition: Stable Temperature (F): 98.5 Ambulatory Status: Ambulatory Pulse (bpm): 80 Discharge Destination: Home Respiratory Rate (breaths/min): 18 Transportation: Private Auto Blood Pressure (mmHg): 145/74 Accompanied By: self Schedule Follow-up Appointment: Yes Clinical Summary of Care: Electronic Signature(s) Signed: 07/08/2018 5:03:03 PM By: Curtis Sites Entered By: Curtis Sites on 07/08/2018 08:56:30 [name] ([id number]) -------------------------------------------------------------------------------- Lower Extremity Assessment Details Patient Name: [name] Date of Service: 07/08/2018 8:15 AM Medical Record Number: [id number] Patient Account Number: [id number] Date of Birth/Sex: March 04, 1946 (72 y.o. M) Treating RN: Huel Coventry Primary Care Fawn Desrocher: [name] Other Clinician: Referring Tamorah Hada: [name] Treating Mya Suell/Extender: STONE III, HOYT Weeks in Treatment: 129 Edema Assessment Assessed: [Left: No] [Right: Yes] Edema: [Left: N] [Right: o] Vascular Assessment Pulses: Dorsalis Pedis Palpable: [Right:Yes] Posterior Tibial Extremity colors, hair growth, and conditions: Extremity Color: [Right:Hyperpigmented] Hair Growth on Extremity: [Right:No] Temperature of Extremity: [Right:Warm] Capillary Refill: [Right:< 3 seconds] Toe Nail Assessment Left: Right: Thick: Yes Discolored: Yes Deformed: Yes Improper Length and Hygiene: No Electronic Signature(s) Signed: 07/08/2018 4:48:54 PM By: Elliot Gurney, BSN, RN, CWS, Kim RN, BSN Entered By: Elliot Gurney, BSN, RN, CWS, Kim on 07/08/2018 08:32:01 [name] ([id number]) -------------------------------------------------------------------------------- Multi Wound Chart Details Patient Name: [name] Date of Service: 07/08/2018 8:15 AM Medical Record Number: [id number] Patient  Account Number: [id number] Date of Birth/Sex: July 17, 1946 (72 y.o. M) Treating RN: Huel Coventry Primary Care Metha Kolasa: [name] Other Clinician: Referring Jerell Demery: [name] Treating Yaret Hush/Extender: Linwood Dibbles, HOYT Weeks in Treatment: 129 Vital Signs Height(in): 76 Pulse(bpm): 80 Weight(lbs): 238 Blood Pressure(mmHg): 145/74 Body Mass Index(BMI): 29 Temperature(F): 98.5 Respiratory Rate 18 (breaths/min): Photos: [1:No Photos] [2:No Photos] [N/A:N/A] Wound Location: [1:Right, Lateral Malleolus] [2:Right, Medial Malleolus] [N/A:N/A] Wounding Event: [1:Gradually Appeared] [2:Gradually Appeared] [N/A:N/A] Primary Etiology: [1:Venous Leg Ulcer] [2:Venous Leg Ulcer] [N/A:N/A] Date Acquired: [1:08/11/2015] [2:01/30/2016] [N/A:N/A] Weeks of Treatment: [1:129] [2:127] [N/A:N/A] Wound Status: [1:Open] [2:Open] [N/A:N/A] Clustered Wound: [1:No] [2:Yes] [N/A:N/A] Measurements L x W x  D [1:4x4x0.2] [2:3.2x2.5x0.2] [N/A:N/A] (cm) Area (cm) : [1:12.566] [2:6.283] [N/A:N/A] Volume (cm) : [1:2.513] [2:1.257] [N/A:N/A] % Reduction in Area: [1:27.30%] [2:45.60%] [N/A:N/A] % Reduction in Volume: [1:51.50%] [2:-8.80%] [N/A:N/A] Classification: [1:Full Thickness Without Exposed Support Structures] [2:Full Thickness Without Exposed Support Structures] [N/A:N/A] Periwound Skin Texture: [1:No Abnormalities Noted] [2:No Abnormalities Noted] [N/A:N/A] Periwound Skin Moisture: [1:No Abnormalities Noted] [2:No Abnormalities Noted] [N/A:N/A] Periwound Skin Color: [1:No Abnormalities Noted No] [2:No Abnormalities Noted No] [N/A:N/A N/A] Treatment Notes Electronic Signature(s) Signed: 07/08/2018 4:48:54 PM By: Elliot GurneyWoody, BSN, RN, CWS, Kim RN, BSN Entered By: Elliot GurneyWoody, BSN, RN, CWS, Kim on 07/08/2018 08:32:21 [name] StackBLACKWELL, Joanthony C. ([id number]) -------------------------------------------------------------------------------- Multi-Disciplinary Care Plan Details Patient Name: [name] StackBLACKWELL, Caiden C. Date of  Service: 07/08/2018 8:15 AM Medical Record Number: [id number] Patient Account Number: [id number] Date of Birth/Sex: 02-08-1946 [age] y.o. M) Treating RN: Huel CoventryWoody, Kim Primary Care Kashae Carstens: Darreld McleanMILES, LINDA Other Clinician: Referring Winnie Barsky: Darreld McleanMILES, LINDA Treating Torri Michalski/Extender: Linwood DibblesSTONE III, HOYT Weeks in Treatment: 129 Active Inactive ` Venous Leg Ulcer Nursing Diagnoses: Knowledge deficit related to disease process and management Goals: Patient will maintain optimal edema control Date Initiated: 04/03/2016 Target Resolution Date: 11/24/2016 Goal Status: Active Interventions: Compression as ordered Treatment Activities: Therapeutic compression applied : 04/03/2016 Notes: ` Wound/Skin Impairment Nursing Diagnoses: Impaired tissue integrity Goals: Ulcer/skin breakdown will heal within 14 weeks Date Initiated: 01/17/2016 Target Resolution Date: 11/24/2016 Goal Status: Active Interventions: Assess ulceration(s) every visit Notes: Electronic Signature(s) Signed: 07/08/2018 4:48:54 PM By: Elliot GurneyWoody, BSN, RN, CWS, Kim RN, BSN Entered By: Elliot GurneyWoody, BSN, RN, CWS, Kim on 07/08/2018 08:32:13 [name] StackBLACKWELL, Mazin C. ([id number]) -------------------------------------------------------------------------------- Pain Assessment Details Patient Name: [name] StackBLACKWELL, Burdell C. Date of Service: 07/08/2018 8:15 AM Medical Record Number: [id number] Patient Account Number: [id number] Date of Birth/Sex: 02-08-1946 [age] y.o. M) Treating RN: Curtis Sitesorthy, Joanna Primary Care Haneen Bernales: Darreld McleanMILES, LINDA Other Clinician: Referring Seva Chancy: Darreld McleanMILES, LINDA Treating Vencil Basnett/Extender: STONE III, HOYT Weeks in Treatment: 129 Active Problems Location of Pain Severity and Description of Pain Patient Has Paino No Site Locations Pain Management and Medication Current Pain Management: Electronic Signature(s) Signed: 07/08/2018 2:49:09 PM By: Dayton MartesWallace, RCP,RRT,CHT, Sallie RCP, RRT, CHT Signed: 07/08/2018 5:03:03 PM By: Curtis Sitesorthy,  Joanna Entered By: Dayton MartesWallace, RCP,RRT,CHT, Sallie on 07/08/2018 08:23:29 [name] StackBLACKWELL, Zaydin C. ([id number]) -------------------------------------------------------------------------------- Patient/Caregiver Education Details Patient Name: [name] StackBLACKWELL, Abdullahi C. Date of Service: 07/08/2018 8:15 AM Medical Record Number: [id number] Patient Account Number: [id number] Date of Birth/Gender: 02-08-1946 (72 y.o. M) Treating RN: Curtis Sitesorthy, Joanna Primary Care Physician: Darreld McleanMILES, LINDA Other Clinician: Referring Physician: Darreld McleanMILES, LINDA Treating Physician/Extender: Skeet SimmerSTONE III, HOYT Weeks in Treatment: 129 Education Assessment Education Provided To: Patient Education Topics Provided Wound/Skin Impairment: Handouts: Other: wound care as ordered Methods: Demonstration, Explain/Verbal Responses: State content correctly Electronic Signature(s) Signed: 07/08/2018 5:03:03 PM By: Curtis Sitesorthy, Joanna Entered By: Curtis Sitesorthy, Joanna on 07/08/2018 08:52:52 [name] StackBLACKWELL, Quincy C. ([id number]) -------------------------------------------------------------------------------- Wound Assessment Details Patient Name: [name] StackBLACKWELL, Rondey C. Date of Service: 07/08/2018 8:15 AM Medical Record Number: [id number] Patient Account Number: [id number] Date of Birth/Sex: 02-08-1946 [age] y.o. M) Treating RN: Huel CoventryWoody, Kim Primary Care [name] [name]: Darreld McleanMILES, LINDA Other Clinician: Referring Sebrina Kessner: Darreld McleanMILES, LINDA Treating Bexton Haak/Extender: STONE III, HOYT Weeks in Treatment: 129 Wound Status Wound Number: 1 Primary Etiology: Venous Leg Ulcer Wound Location: Right, Lateral Malleolus Wound Status: Open Wounding Event: Gradually Appeared Date Acquired: 08/11/2015 Weeks Of Treatment: 129 Clustered Wound: No Wound Measurements Length: (cm) 4 Width: (cm) 4 Depth: (cm) 0.2 Area: (cm) 12.566 Volume: (cm) 2.513 % Reduction in Area: 27.3% % Reduction in Volume: 51.5% Wound Description Full Thickness Without Exposed  Support Classification: Structures  Periwound Skin Texture Texture Color No Abnormalities Noted: No No Abnormalities Noted: No Moisture No Abnormalities Noted: No Treatment Notes Wound #1 (Right, Lateral Malleolus) Notes hydrafera blue, ABD pad, conform and tape Electronic Signature(s) Signed: 07/08/2018 4:48:54 PM By: Elliot Gurney, BSN, RN, CWS, Kim RN, BSN Entered By: Elliot Gurney, BSN, RN, CWS, Kim on 07/08/2018 08:30:27 [name] ([id number]) -------------------------------------------------------------------------------- Wound Assessment Details Patient Name: [name] Date of Service: 07/08/2018 8:15 AM Medical Record Number: [id number] Patient Account Number: [id number] Date of Birth/Sex: 1945/10/24 (72 y.o. M) Treating RN: Huel Coventry Primary Care Latricia Cerrito: [name] Other Clinician: Referring Jerusalem Wert: [name] Treating Nehemie Casserly/Extender: STONE III, HOYT Weeks in Treatment: 129 Wound Status Wound Number: 2 Primary Etiology: Venous Leg Ulcer Wound Location: Right, Medial Malleolus Wound Status: Open Wounding Event: Gradually Appeared Date Acquired: 01/30/2016 Weeks Of Treatment: 127 Clustered Wound: Yes Wound Measurements Length: (cm) 3.2 Width: (cm) 2.5 Depth: (cm) 0.2 Area: (cm) 6.283 Volume: (cm) 1.257 % Reduction in Area: 45.6% % Reduction in Volume: -8.8% Wound Description Full Thickness Without Exposed Support Classification: Structures Periwound Skin Texture Texture Color No Abnormalities Noted: No No Abnormalities Noted: No Moisture No Abnormalities Noted: No Treatment Notes Wound #2 (Right, Medial Malleolus) Notes hydrafera blue, ABD pad, conform and tape Electronic Signature(s) Signed: 07/08/2018 4:48:54 PM By: Elliot Gurney, BSN, RN, CWS, Kim RN, BSN Entered By: Elliot Gurney, BSN, RN, CWS, Kim on 07/08/2018 08:30:27 [name] ([id number]) -------------------------------------------------------------------------------- Vitals  Details Patient Name: [name] Date of Service: 07/08/2018 8:15 AM Medical Record Number: [id number] Patient Account Number: [id number] Date of Birth/Sex: 02-20-1946 (72 y.o. M) Treating RN: Curtis Sites Primary Care Ryeleigh Santore: [name] Other Clinician: Referring Angel Weedon: [name] Treating Shakeira Rhee/Extender: STONE III, HOYT Weeks in Treatment: 129 Vital Signs Time Taken: 08:22 Temperature (F): 98.5 Height (in): 76 Pulse (bpm): 80 Weight (lbs): 238 Respiratory Rate (breaths/min): 18 Body Mass Index (BMI): 29 Blood Pressure (mmHg): 145/74 Reference Range: 80 - 120 mg / dl Electronic Signature(s) Signed: 07/08/2018 2:49:09 PM By: Dayton Martes RCP, RRT, CHT Entered By: Weyman Rodney, Lucio Edward on 07/08/2018 08:26:43

## 2018-07-10 NOTE — Progress Notes (Signed)
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 f/u. he admits out in rain on Saturday and soaked his right leg. he did not share with his wife and he  didn't notify the [facility]. he has an odor today that is c/w pseudomonas. Wound has greenish tan slough. there is no periwound erythema, induration, or fluctuance. wound has deteriorated since previous visit. denies fever, chills, body aches or malaise. no increased pain. 03/13/16: C+S showed proteus. He has not received AB'S. Switched to RTD last week. 03/27/16 patient is been using Iodoflex. Wound bed has improved and debridement is certainly easier 04/10/2016 -- he has been scheduled for a venous duplex study towards the end of the month 04/17/16; has been using silver alginate, states that the Iodoflex was hurting his wound and since that is been changed he has had no pain unfortunately the surface of the wound continues to be unhealthy with thick gelatinous slough and nonviable tissue. The wound will not heal like this. 04/20/2016 -- the patient was here for a nurse visit but I was asked to see the patient as the slough was quite significant and the nurse needed for clarification regarding the ointment to be used. 04/24/16; the patient's wounds on the right medial and right lateral ankle/malleolus both look a lot better today. Less adherent slough healthier tissue. Dimensions better especially medially 05/01/16; the patient's wound surface continues to improve however he continues to require debridement switch her easier each week. Continue Santyl/Metahydrin mixture Hydrofera Blue next week. Still drainage on the medial aspect according to the intake nurse 05/08/16; still using Santyl and Medihoney. Still a lot of drainage per her intake nurse. Patient has no complaints pain fever chills etc. 05/15/16 switched the Hydrofera Blue last week. Dimensions down especially in the medial right leg wound. Area on the lateral which is more substantial also looks better still requires debridement 05/22/16; we have been using Hydrofera Blue. 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 Currently he tells me that the pain is doing very well about a 1 out of 10 today. Yesterday was a little bit worse but he tells me that he was more active watering his flowers that day. Overall he feels that his symptoms are doing significantly better at this point in time. His edema continues to be controlled well with the 4-layer compression wrap and he really has not noted any odor at this point in time. He is tolerating the dressing changes when they are performed well. 06/05/16 at this point in time today patient currently shows no interval signs or symptoms of local or systemic infection. Again his pain level he rates to be a 1 out of 10 at most and overall he tells me that generally this is not giving him much trouble. In fact he even feels maybe a little bit better than last week. We have continue with the 4-layer compression wrap in which she tolerates very well at this point. He is continuing to utilize the [place]. 06/12/16 I think there has been some progression in the status of both of these wounds over today again covered in a gelatinous surface. Has been using Hydrofera Blue. We had used Iodoflex in the past I'm not sure if there was an issue other than changing to something that might progress towards closure faster 06/19/16; he did not tolerate the Flexeril last week secondary to pain and this was changed on Friday back to [facility] area he continues to have copious amounts of gelatinous surface slough  which is think inhibiting the speed of healing this area 06/26/16 patient over the last week has utilized the Santyl to try to loosen up some of the tightly adherent slough that was noted on evaluation last week. The good news is he tells me that the medial malleoli region really  does not bother him the right llateral malleoli region is more tender to palpation at this point in time especially in the central/inferior location. However it does appear that the Santyl has done his job to loosen up the adherent slough at this point in time. Fortunately he has no interval signs or symptoms of infection locally or systemically no purulent discharge noted. 07/03/16 at this point in time today patient's wounds appear to be significantly improved over the right medial and lateral malleolus locations. He has much less tenderness at this point in time and the wounds appear clean her although there is still adherent slough this is sufficiently improved over what I saw last week. I still see no evidence of local infection. 07/10/16; continued gradual improvement in the right medial and lateral malleolus locations. The lateral is more substantial wound now divided into 2 by a rim of normal epithelialization. 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 He denies any complaints of pain or intolerance to compression. Both ulcers are smaller in size oriented today's measurements, both are covered with a softly adherent slough. 07/24/16; medial wound is smaller, lateral about the same although surface looks better. Still using Hydrofera Blue 07/31/16; arrives today complaining of pain in the lateral part of his foot. Nurse reports a lot more drainage. He has been using Hydrofera Blue. Switch to silver alginate today 08/03/2016 -- I was asked to see the patient was here for a nurse visit today. I understand he had a lot of pain in his right lower extremity and was having blisters on his right foot which have not been there before. Though he started on doxycycline he does not have blisters elsewhere on his body. I do not believe this is a drug allergy. also mentioned that there  was a copious purulent discharge from the wound and clinically there is no evidence of cellulitis. 08/07/16; I note that the patient came in for his nurse check on Friday apparently with blisters on his toes on the right than a lot of swelling in his forefoot. He continued on the doxycycline that I had prescribed on 12/8. A culture was done of the lateral wound that showed a combination of a few Proteus and Pseudomonas. Doxycycline might of covered the Proteus but would be unlikely to cover the Pseudomonas. He is on Coumadin. He arrives in the clinic today feeling a lot better states the pain is a lot better but nothing specific really was done other than to rewrap the foot also noted that he had arterial studies ordered in August although these were never done. It is reasonable to go ahead and reorder these. 08/14/16; generally arrives in a better state today in terms of the wounds he has taken cefdinir for one week. Our intake nurse reports copious amounts of drainage but the patient is complaining of much less pain. He is not had his PT and INR checked and I've asked him to do this today or tomorrow. 08/24/2016 -- patient arrives today after 10 days and said he had a stomach upset. His arterial study was done and I have  reviewed this report and find it to be within normal limits. However I did not note any venous duplex studies for reflux, and Dr. Leanord Hawking may have ordered these in the past but I will leave it to him to decide if he needs these. The patient has finished his course of cefdinir. 08/28/16; patient arrives today again with copious amounts of thick really green drainage for our intake nurse. He states he has a very tender spot at the superior part of the lateral wound. Wounds are larger 09/04/16; no real change in the condition of this patient's wound still copious amounts of surface slough. Started him on Iodoflex last week he is completing another course of Cefdinir or which I think was  done empirically. His arterial study showed ABIs were 1.1 on the right 1.5 on the left. He did have a slightly reduced ABI in the right the left one was not obtained. Had calcification of the right posterior tibial artery. The interpretation was no segmental stenosis. His waveforms were triphasic. [name], [name] ([id number]) His reflux studies are later this month. Depending on this I'll send him for a vascular consultation, he may need to see plastic surgery as I believe he is had plastic surgery on this foot in the past. He had an injury to the foot in the 1980s. 1/16 /18 right lateral greater than right medial ankle wounds on the right in the setting of previous skin grafting. 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 He does not have arterial issues. Each week he comes in with the same adherent surface slough although there was less of this today 09/18/16; right lateral greater than right medial lower extremity wounds in the setting of previous skin grafting and trauma. He has least to vein laser ablation scheduled for February 2 for venous reflux. He does not have significant arterial disease. Problem has been very difficult to handle surface slough/necrotic tissue. Recently using Iodoflex for this with some, albeit slow improvement 09/25/16; right lateral greater than right medial lower extremity venous wounds in the setting of previous skin grafting. He is going for ablation surgery on February 2 after this he'll come back here for rewrap. He has been using Iodoflex as the primary dressing. 10/02/16; right lateral greater than right medial lower extremity wounds in the setting of previous skin grafting. He had his ablation surgery last week, I don't have a report. He tolerated this well. Came in with a thigh-high Unna boots on Friday. We have been using Iodoflex as the primary dressing. His measurements are  improving 10/09/16; continues to make nice aggressive in terms of the wounds on his lateral and medial right ankle in the setting of previous skin grafting. Yesterday he noticed drainage at one of his surgical sites from his venous ablation on the right calf. He took off the bandage over this area felt a "popping" sensation and a reddish-brown drainage. He is not complaining of any pain 10/16/16; he continues to make nice progression in terms of the wounds on the lateral and medial malleolus. Both smaller using Iodoflex. He had a surgical area in his posterior mid calf we have been using iodoform. All the wounds are down and dimensions 10/23/16; the patient arrives today with no complaints. He states the Iodoflex is a bit uncomfortable. He is not systemically unwell. We have been using Iodoflex to the lateral right ankle and the medial  and Aquacel Ag to the reflux surgical wound on the posterior right calf. All of these wounds are doing well 10/30/16; patient states he has no pain no systemic symptoms. I changed him to [facility] last week. Although the wounds are doing well 11/06/16; patient reports no pain or systemic symptoms. We continue with Hydrofera Blue. Both wound areas on the medial and lateral ankle appear to be doing well with improvement and dimensions and improvement in the wound bed. 11/13/16; patient's dimensions continued to improve. We continue with Hydrofera Blue on the medial and lateral side. Appear to be doing well with healthy granulation and advancing epithelialization 11/20/16; patient's dimensions improving laterally by about half a centimeter in length. Otherwise no change on the medial side. Using [facility] 12/04/16; no major change in patient's wound dimensions. Intake nurse reports more drainage. 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 He is voicing no  complaints or concerns. He has been tolerating his twice weekly compression therapy changes 12/11/16 Patient complains of pain and increased drainage.. wants hydrofera blue 12/18/16 improvement. Sorbact 12/25/16; medial wound is smaller, lateral measures the same. Still on sorbact 01/01/17; medial wound continues to be smaller, lateral measures about the same however there is clearly advancing epithelialization here as well in fact I think the wound will ultimately divided into 2 open areas 01/08/17; unfortunately today fairly significant regression in several areas. Surface of the lateral wound covered again in adherent necrotic material which is difficult to debridement. He has significant surrounding skin maceration. The expanding area of tissue epithelialization in the middle of the wound that was encouraging last week appears to be smaller. There is no surrounding tenderness. The area on the medial leg also did not seem to be as healthy as last week, the reason for this regression this week is not totally clear. We have been using Sorbact for the last 4 weeks. We'll switched of polymen AG which we will order via home medical supply. If there is a problem with this would switch back to Iodoflex 01/15/18; drainage,odor. No change. Switched to polymen last week 01/22/17; still continuous drainage. Culture I did last week showed a few Proteus pansensitive. I did this culture because of drainage. Put him on Augmentin which she has been taking since Saturday however he is developed 4-5 liquid bowel movements. He is also on Coumadin. Beyond this wound is not changed at all, still nonviable necrotic surface material which I debrided reveals healthy granulation line 01/29/17; still copious amounts of drainage reported by her intake nurse. Wound measuring slightly smaller. Currently fact on Iodoflex although I'm looking forward to changing back to perhaps [facility] or polymen AG 02/05/17; still large amounts  of drainage and presenting with really large amounts of adherent slough and necrotic material over the remaining open area of the wound. We have been using Iodoflex but with little improvement in the surface. Change to [facility] 02/12/17; still large amount of drainage. Much less adherent slough however. Started [employer]	[place] last week DIONYSIOS, MASSMAN ([id number]) 02/19/17; drainage is better this week. Much less adherent slough. Perhaps some improvement in dimensions. Using [facility] 02/26/17; severe venous insufficiency wounds on the right lateral and right medial leg. Drainage is some better and slough is less adherent we've been using Hydrofera Blue 03/05/17 on evaluation today patient appears to be doing well. His wounds have been decreasing in size and overall he  is pleased with how this is progressing. We are awaiting approval for the epigraph which has previously been recommended in the meantime the [facility] Dressing is to be doing very well for him. 03/12/17; wound dimensions are smaller still using Hydrofera Blue. Comes in on Fridays for a dressing change. 03/19/17; wound dimensions continued to contract. Healing of this wound is complicated by continuous significant drainage as well as recurrent buildup of necrotic surface material. We looked into Apligraf and he has a $290 co-pay per application but truthfully I think the drainage as well as the nonviable surface would preclude use of Apligraf there are any other skin substitute at this point therefore the continued plan will be debridement each clinic visit, 2 times a week dressing changes and continued use of Hydrofera Blue. improvement has been very slow but sustained 03/26/17 perhaps slight improvements in peripheral epithelialization is especially inferiorly. Still with large amount of drainage and tightly adherent necrotic surface on arrival. Along with the intake nurse I reviewed previous treatment. He worsened  on Iodoflex and had a 4 week trial of sorbact, Polymen AG and a long courses of Aquacel Ag. He is not a candidate for advanced treatment options for many reasons 04/09/17 on evaluation today patient's right lower extremity wounds appear to be doing a little better. Fortunately he has no significant discomfort and has been tolerating the dressing changes including the wraps without complication. With that being said he really does not have swelling anymore compared to what he has had in the past following his vascular intervention. He wonders if potentially we could attempt avoiding the rats to see if he could cleanse the wound in between to try to prevent some of the fiber and its buildup that occurs in the interim between when we see him week to week. No fevers, chills, nausea, or vomiting noted at this time. 04/16/17; noted that the staff made a choice last week not to put him in compression. The patient is changing his dressing at home using [facility] and changing every second day. His wife is washing the wound with saline. He is using kerlix. Surprisingly he has not developed a lot of edema. This choice was made because of the degree of fluid retention and maceration even with changing his dressing twice a week 04/23/17; absolutely no change. Using Hydrofera Blue. Recurrent tightly adherent nonviable surface material. This is been refractory to Iodoflex, sorbact and now Hydrofera Blue. More recently he has been changing his own dressings at home, cleansing the wound in the shower. He has not developed lower extremity edema 04/30/17; if anything the wound is larger still in adherent surface although it debrided easily today. I've been using Mehta honey for 1 week and I'd like to try and do it for a second week this see if we can get some form of viable surface here. 05/07/17; patient arrives with copious amounts of drainage and some pain in the superior part of the wound. He has not  been systemically unwell. He's been changing this daily at home 05/14/17; patient arrives today complaining of less drainage and less pain. Dimensions slightly better. Surface culture I did of this last week grew MSSA I put him on dicloxacillin for 10 days. Patient requesting a further prescription since he feels so much better. He did not obtain the [facility] AG for reasons that aren't clear, he has been using Hydrofera Blue 05/21/17; perhaps some less drainage and less pain. He is completing another week of doxycycline. Unfortunately  there is no change in the wound measurements are appearance still tightly adherent necrotic surface material that is really defied treatment. We have been using Hydrofera Blue most recently. He did not manage to get Anasept. He was told it was prescription at [facility] 05/29/17; absolutely no improvement in these wound areas. He had remote plastic surgery with who was done at [facility] in [place] surrounding this area. This was a traumatic wound with extensive plastic surgery/skin grafting at that time. I switched him to sorbact last week to see if he can do anything about the recurrent necrotic surface and drainage. This is largely been refractory to Iodoflex/Hydrofera Blue/alginates. I believe we tried Elby [employer]ers and more recently Medi honey l however the drainage is really too excessive 06/04/17; patient went to see Dr. Ulice Bold. Per the patient she ordered him a compression stocking. Continued the Anasept gel and sorbact was already on. No major improvement in the condition of the wounds in fact the medial wound looks larger. Again per the patient she did not feel a skin graft or operative debridement was indicated The patient had previous venous ablation in February 2018. Last arterial studies were in December 2017 and were within normal limits 06/18/17; patient arrives back in clinic today using Anasept gel with sorbact. He changes this every day is wearing his  own compression stocking. The patient actually saw Dr. Leta Baptist of plastic surgery. I've reviewed her note. The appointment was on 05/30/17. The basic issue with here was that she did not feel that skin grafts for patients with underlying stasis and edema have a good long-term success rate. She also discuss skin substitutes which has been done here as well however I have not been able to get the surface of these wounds to something that I think would support skin substitutes. This is why he felt he might need an ZIERE, DOCKEN. ([id number]) operative debridement more aggressively than I can do in this clinic Review of systems; is otherwise negative he feels well 07/02/17; patient has been using Anasept gel with Sorbact. He changes this every day scrubs out the wound beds. Arrives today with the wound bed looking some better. Easier debridement 07/16/17; patient has been using Anasept gel was sorbact for about a month now. The necrotic service on his wound bed is better and the drainage is now down to minimal but we've not made any improvements in the epithelialization. Change to Santyl today. Surface of the wound is still not good enough to support skin substitutes 07/30/17 on evaluation today patient appears to be doing very well in regard to his right bimalleolus wounds. He has been tolerating the treatment with the Anasept gel and sorbact. However we were going to try and switch to Santyl to be more aggressive with these wounds. This was however cost prohibitive. Therefore we will likely go back to the sorbact. Nonetheless he is not having any significant discomfort he still is forming slough over the wound location but nothing too significant. The wounds do appear to be filling in nicely. 08/13/17; I have not seen this wound in about a month. There is not much change. The fact the area medially looks larger. He has been using sorbact and Anasept for over a month now without much effect.  Arrives in clinic stating that he does not want the area debrided 08/28/17; patient arrives with the areas on his right medial and right lateral ankle worse. The wounds of expanded there is erythema and still drainage. There is no  pain and no tenderness. We had been using Keracel AG clearly not doing well. 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 He states that even the evening that we began utilizing the Iodoflex he started having burning. Subsequently this never really improved and he eventually discontinue the use of the Iodoflex altogether. Unfortunately he did not let us know about any of this until today. I'm unsure of any specific infection at this point although I am going to obtain a wound culture today in order to see if there's anything that could potentially be causing an infection as well. It does sound as if he's had the Iodoflex before and did not have this kind of reaction although this does sound very specific for a reaction to the Iodoflex. 09/17/17 on evaluation today patient appears to be doing significantly better compared to last week's evaluation. At that time it actually appears that he did have an infection the good news is that we have been able to get this under control with switching to the [facility] solution he's not having as much pain and discomfort he still does have some erythema surrounding the medial aspect wound. He has been tolerating the Dakin's soaked gauze dressing which is excellent and that his wounds do not seem to have as much adherent slough. Overall I'm pleased with the progress she's made in last week. 11/05/17 on evaluation today patient appears to be doing better in regard to his right medial  and lateral malleolus ulcers. He has been tolerating the dressing changes without complication. I do flight the Freada Bergeron is helping with additional granulation at this point in the wound beds do appear to be a little bit better. With that being said patient does not appear to have any evidence of significant infection at this point there is no erythema as he has previously noted he does note that the 30 day course of antibiotics I placed him on will end tomorrow. 11/12/17 on evaluation today patient actually appears to be doing excellent in regard to his right medial and lateral malleolus ulcers. He has been tolerating the dressing changes without complication. Fortunately he has no evidence of infection at this time and overall I believe he is progressing extremely nicely he has a lot of new epithelialization noted on evaluation today. Patient likewise is very pleased with the progress even just since last week. 11/19/17 on evaluation today patient appears to be doing about the same in regard to his ulcerations. He does not have any additional breakdown although he really has not noted any significant improvement either over the past week. With that being said the more concerning thing at this time is that he is beginning to show signs of erythema surrounding both wounds which is what typically happens and then he will develop into a more overt infection. This has been the course for some time. I hope that doing the 30 day in a biotic cycle this past time would break that so that he would not have the same type of thing happen again. Nonetheless it appears that is digressing back into this infected state unfortunately. With that being said he is not having any pain currently which is good he typically does start to hurt  more as this progresses. Fortunately there does not appear to be any evidence of infection systemically which is good news. 11/26/17 on evaluation today patient appears to actually be  doing rather well in regard to his ulcer compared to last week. He still has your theme and noted around both the medial and lateral malleolus ulcers of the right lower extremity. He does not seem to be quite as overtly infected however. He has been tolerating the dressing changes without complication which is good news. The gentamicin cream does seem to have been of benefit for him. With that being said he does actually have an appointment with Dr. Sampson Goon this morning to see if there's anything that would be recommended in the way of IV antibiotic [name], [name] therapy. Otherwise he still has slough noted on the surface of the wound. 12/03/17 on evaluation today patient presents for follow-up concerning his ongoing right lower extremity ulcers. He has been tolerating the dressing changes without complication he states he has not really been using gentamicin on the lateral ankle and this seems to be doing very well. For that reason I think that is probably fine to put a hold on the gentamicin he states his burns and stings when he applies it and we maybe be able to just use this intermittently when things become more irritated. Other than that the good news is I did review his MRI which was performed on 11/28/17 and revealed that he has a negative MRI for abscess, osteomyelitis, or septic joint. Obviously these were the issues that I was concerned with the possibility of and I'm pleased to find that there is no problems in that regard. I did also review Dr. Jarrett Ables note from infectious disease he discussed performed some lab work which apparently was done and then subsequently was going to consider the possibility of Keflex long-term for prevention of infection although this has not been prescribed as of yet. 12/09/17 on evaluation today patient appears to be doing a little bit worse in regard to his right medial and lateral malleolus ulcers. Fortunately the wound does not  seem to be significantly worse although he does have a lot more drainage especially the lateral aspect he is having a lot more pain than what he has noted more recently. Fortunately there does not appear to be any evidence of systemic infection which is good news. Again he has seen Dr. Sampson Goon but he has not been placed on the potential Keflex which was recommended as a possibility at least yet. 12/17/17 unfortunately on evaluation today the patient's wound appears to be doing much worse. He has been performing the dressing changes as directed. Upon a review of notes in epic it does appear that Dr. Jarrett Ables office specifically sending Keflex for the patient on the 16th which was last Tuesday. With that being said the patient states that though this was sent into [facility] that [facility] stated they never received it and subsequently the patient never took anything. He tells me he has been in bed since Friday due to the increased pain and overall general malaise. No fevers, chills, nausea, or vomiting noted at this time. 02/25/18 on evaluation today patient appears to be doing rather well in regard to his bilateral right malleolus ulcers. He has been tolerating the dressing changes without complication. Currently there does not appear to be any evidence of infection. Overall I'm very pleased with the progress that seems to been made up to this point. 03/11/18 on evaluation today  patient actually appears to be doing very well in regard to his right ankle ulcers. He's been tolerating the dressing changes without complication. Fortunately there does not appear to be any evidence of sniffing infection I do believe that the gentamicin cream continues to be of benefit for him. Fortunately he is showing signs of healing in which time I see him. He also tells me he's having no pain. 03/25/18 on evaluation today patient actually appears to show signs of improvement especially in regard to the right  lateral ankle ulcer. He has been tolerating the dressing changes without complication. In general I'm very pleased with the progress that has been made up to this point. 04/08/18- He presents in follow up evaluation for right bimalleolar wounds; there is essentially no change. He admits to removing the dressing at night to allow the wounds to be "air out". We will continue with same treatment plan and he will follow up in two weeks 04/22/18 on evaluation today patient seems very frustrated with this situation currently as far as the ulcer on his right medial and lateral malleolus is concerned. He states this has been going on a very long time and obviously he does have a lot of bills as well is far as wound care is concerned. With that being said he tells me that he's been tolerating the dressing changes well although he did clarify that what he is doing is putting the medicine on in the morning after shower and then subsequently he takes the dressing off at night leaving it open to air. I previously asked him about this and I was not aware that he was doing this. Nonetheless that may be slowing things down a bit at this point. Fortunately there does not appear to be any evidence of severe infection although he does have some evidence of your theme is surrounding the wound bed. He is still using the gentamicin cream. 05/06/18 on evaluation today patient actually presents for evaluation of his right medial malleolus ulcers. He's been tolerating the dressing changes without complication. With that being said it does appear that he is doing better compared to last evaluation. He's not having the pain like he did previous. He also states that blisters that he had coming up when I saw him last did resolve and ended up doing okay he did go to the ER they gave him some cream to put on it he's not sure what the name of the cream was. He was down in [facility] when he called me and I spoke with him previous.  Nonetheless he states that they told him to put the medicine on his our due list. With that being said I'm not really 100% sure that the medicine had anything to do with this due to the fact that again he was having the blisters when he came into the office which is why we actually put them on the anabiotic I was concerned about him having an infection which was causing the blistering and increased pain in his extremity. Nonetheless this is something that we can definitely be cautious of in the future it was Bactrim Benison, Brewster C. ([id number]) that we had him on. Overall I'm glad he is doing better. 05/20/18 on evaluation today patient actually appears to be doing fairly well in regard to his ulcer on the right lateral and medial malleolus locations. Both seem to be doing decently well he does have slough building up on the surface of the wound. He has  been using the Hibiclense as I instructed him. Overall there's no evidence of significant infection which is good news. 06/02/18 on evaluation today patient actually appears to be doing excellent in regard to his right medial and lateral malleolus ulcers. He has been tolerating the dressing changes without complication. There does not appear to be any evidence of infection at this time which is great news. In general I feel like he has made great progress in the past several weeks. He has good granulation noted over areas that have not had good granulation previously and epithelialization even more so in several areas. Both wounds measured smaller. 06/17/18 an evaluation today patient actually appears to be doing excellent in regard to his right lateral and medial malleolus ulcers. He has been tolerating the dressing changes without complication fortunately the Hydrofera Blue Dressing to be doing excellent for him. He's actually making progress each time I see him. 07/08/18 on evaluation today patient's wounds on the right medial and lateral  malleolus or region show signs of improvement currently. He has been tolerating the dressing changes without complication at this time. He does feel like the [facility] Dressing has been of benefit for him. No fevers, chills, nausea, or vomiting noted at this time. Patient History Information obtained from Patient. Social History Former smoker - 10 years ago, Marital Status - Married, Alcohol Use - Moderate, Drug Use - No History, Caffeine Use - Never. Medical And Surgical History Notes Constitutional Symptoms (General Health) Vein Filter (groin area); CODA; H/O Blood Clots; pulmonary hypertensive arterial disease Review of Systems (ROS) Constitutional Symptoms (General Health) Denies complaints or symptoms of Fever, Chills. Respiratory The patient has no complaints or symptoms. Cardiovascular The patient has no complaints or symptoms. Psychiatric The patient has no complaints or symptoms. Objective Constitutional Well-nourished and well-hydrated in no acute distress. Vitals Time Taken: 8:22 AM, Height: 76 in, Weight: 238 lbs, BMI: 29, Temperature: 98.5 F, Pulse: 80 bpm, Respiratory Rate: 18 breaths/min, Blood Pressure: 145/74 mmHg. [name], [name] ([id number]) Respiratory normal breathing without difficulty. Psychiatric this patient is able to make decisions and demonstrates good insight into disease process. Alert and Oriented x 3. pleasant and cooperative. General Notes: On evaluation today patient's wound bed actually shows evidence of good improvement post debridement. He continues to show signs of everything getting smaller and there's no evidence of infection at this time which is also great news. Overall very pleased in this regard. Integumentary (Hair, Skin) Wound #1 status is Open. Original cause of wound was Gradually Appeared. The wound is located on the Right,Lateral Malleolus. The wound measures 4cm length x 4cm width x 0.2cm depth; 12.566cm^2 area and  2.513cm^3 volume. Wound #2 status is Open. Original cause of wound was Gradually Appeared. The wound is located on the Right,Medial Malleolus. The wound measures 3.2cm length x 2.5cm width x 0.2cm depth; 6.283cm^2 area and 1.257cm^3 volume. 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 There was a Excisional Skin/Subcutaneous Tissue Debridement with a total area of 16 sq cm performed by STONE III, HOYT E., PA-C. With the following instrument(s): Curette to remove Viable and Non-Viable tissue/material.  Material removed includes Subcutaneous Tissue and Slough and after achieving pain control using Lidocaine 4% Topical Solution. No specimens were taken. A time out was conducted at 08:45, prior to the start of the procedure. A Minimum amount of bleeding was controlled with Pressure. The procedure was tolerated well with a pain level of 0 throughout and a pain level of 0 following the procedure. Post Debridement Measurements: 4cm length x 4cm width x 0.3cm depth; 3.77cm^3 volume. Character of Wound/Ulcer Post Debridement is improved. Severity of Tissue Post Debridement is: Fat layer exposed. 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 There was a Excisional Skin/Subcutaneous Tissue Debridement with a total area of 8 sq cm performed by  STONE III, HOYT E., PA-C. With the following instrument(s): Curette to remove Viable and Non-Viable tissue/material. Material removed includes Subcutaneous Tissue and Slough and after achieving pain control using Lidocaine 4% Topical Solution. No specimens were taken. A time out was conducted at 08:49, prior to the start of the procedure. A [name], Deron C. ([id number]) Minimum amount of bleeding was controlled with Pressure. The procedure was tolerated well with a pain level of 0 throughout and a pain level of 0 following the procedure. Post Debridement Measurements: 3.2cm length x 2.5cm width x 0.3cm depth; 1.885cm^3 volume. Character of Wound/Ulcer Post Debridement is improved. Severity of Tissue Post Debridement is: Fat layer exposed. Post procedure Diagnosis Wound #2: Same as Pre-Procedure Plan Wound Cleansing: Wound #1 Right,Lateral Malleolus: Clean wound with Normal Saline. May Shower, gently pat wound dry prior to applying new dressing. - wash with hibiclens daily and leave on 5 mins Wound #2 Right,Medial Malleolus: Clean wound with Normal Saline. May Shower, gently pat wound dry prior to applying new dressing. - wash with hibiclens and leave on 5 mins Anesthetic (add to Medication List): Wound #1 Right,Lateral Malleolus: Topical Lidocaine 4% cream applied to wound bed prior to debridement (In Clinic Only). Wound #2 Right,Medial Malleolus: Topical Lidocaine 4% cream applied to wound bed prior to debridement (In Clinic Only). Primary Wound Dressing: Wound #1 Right,Lateral Malleolus: Hydrafera Blue Ready Transfer - use hydrafera blue classic in the home and moisten with saline Wound #2 Right,Medial Malleolus: Hydrafera Blue Ready Transfer - use hydrafera blue classic in the home and moisten with saline Secondary Dressing: Wound #1 Right,Lateral Malleolus: Dry Gauze Conform/Kerlix Wound #2 Right,Medial Malleolus: Dry Gauze Conform/Kerlix Dressing Change Frequency: Wound #1  Right,Lateral Malleolus: Change dressing every other day. Wound #2 Right,Medial Malleolus: Change dressing every other day. Follow-up Appointments: Wound #1 Right,Lateral Malleolus: Return Appointment in 2 weeks. Wound #2 Right,Medial Malleolus: Return Appointment in 2 weeks. Edema Control: Wound #1 Right,Lateral Malleolus: Elevate legs to the level of the heart and pump ankles as often as possible Wound #2 Right,Medial Malleolus: Elevate legs to the level of the heart and pump ankles as often as possible Additional Orders / Instructions: Wound #1 Right,Lateral Malleolus: Increase protein intake. Wound #2 Right,Medial Malleolus: Increase protein intake. [name], [name] ([id number]) I am going to recommend that we continue with the above wound care measures for the next week. The patient is in agreement with that plan. If anything changes or worsens in the meantime he will contact the office and let me know. Otherwise we will see were things stand at follow-up. Please see above for specific wound care orders. We will see patient for re-evaluation  in 2 week(s) here in the clinic. If anything worsens or changes patient will contact our office for additional recommendations. Electronic Signature(s) Signed: 07/08/2018 9:12:01 AM By: Lenda Kelp PA-C Entered By: Lenda Kelp on 07/08/2018 09:07:55 [name] ([id number]) -------------------------------------------------------------------------------- ROS/PFSH Details Patient Name: [name] Date of Service: 07/08/2018 8:15 AM Medical Record Number: [id number] Patient Account Number: [id number] Date of Birth/Sex: 11/15/1945 (72 y.o. M) Treating RN: Curtis Sites Primary Care Provider: [name] Other Clinician: Referring Provider: [name] Treating Provider/Extender: STONE III, HOYT Weeks in Treatment: 129 Information Obtained From Patient Wound History Do you currently have one or more open  woundso Yes How many open wounds do you currently haveo 1 Approximately how long have you had your woundso 5 months How have you been treating your wound(s) until nowo saline, dressing Has your wound(s) ever healed and then re-openedo Yes Have you had any lab work done in the past montho No Have you tested positive for an antibiotic resistant organism (MRSA, VRE)o No Have you tested positive for osteomyelitis (bone infection)o No Have you had any tests for circulation on your legso No Constitutional Symptoms (General Health) Complaints and Symptoms: Negative for: Fever; Chills Medical History: Past Medical History Notes: Vein Filter (groin area); CODA; H/O Blood Clots; pulmonary hypertensive arterial disease Eyes Medical History: Positive for: Cataracts - removed Negative for: Glaucoma; Optic Neuritis Ear/Nose/Mouth/Throat Medical History: Negative for: Chronic sinus problems/congestion Hematologic/Lymphatic Medical History: Negative for: Anemia; Hemophilia; Human Immunodeficiency Virus; Lymphedema; Sickle Cell Disease Respiratory Complaints and Symptoms: No Complaints or Symptoms Medical History: Positive for: Chronic Obstructive Pulmonary Disease (COPD) Negative for: Aspiration; Asthma; Pneumothorax; Sleep Apnea; Tuberculosis Cardiovascular [name], OKI. ([id number]) Complaints and Symptoms: No Complaints or Symptoms Medical History: Negative for: Angina; Arrhythmia; Congestive Heart Failure; Coronary Artery Disease; Deep Vein Thrombosis; Hypertension; Hypotension; Myocardial Infarction; Peripheral Arterial Disease; Peripheral Venous Disease; Phlebitis; Vasculitis Gastrointestinal Medical History: Negative for: Cirrhosis ; Colitis; Crohnos; Hepatitis A; Hepatitis B; Hepatitis C Endocrine Medical History: Negative for: Type I Diabetes; Type II Diabetes Genitourinary Medical History: Negative for: End Stage Renal Disease Immunological Medical History: Negative  for: Lupus Erythematosus; Raynaudos Integumentary (Skin) Medical History: Negative for: History of Burn; History of pressure wounds Musculoskeletal Medical History: Positive for: Osteoarthritis Negative for: Gout; Rheumatoid Arthritis; Osteomyelitis Neurologic Medical History: Negative for: Dementia; Neuropathy; Quadriplegia; Paraplegia; Seizure Disorder Oncologic Medical History: Negative for: Received Chemotherapy; Received Radiation Psychiatric Complaints and Symptoms: No Complaints or Symptoms Medical History: Negative for: Anorexia/bulimia; Confinement Anxiety HBO Extended History Items UTHMAN, MROCZKOWSKI. ([id number]) Eyes: Cataracts Immunizations Pneumococcal Vaccine: Received Pneumococcal Vaccination: No Implantable Devices Family and Social History Former smoker - 10 years ago; Marital Status - Married; Alcohol Use: Moderate; Drug Use: No History; Caffeine Use: Never; Advanced Directives: No; Patient does not want information on Advanced Directives; Living Will: No; Medical Power of Attorney: No Physician Affirmation I have reviewed and agree with the above information. Electronic Signature(s) Signed: 07/08/2018 9:12:01 AM By: Lenda Kelp PA-C Signed: 07/08/2018 5:03:03 PM By: Curtis Sites Entered By: Lenda Kelp on 07/08/2018 09:06:53 [name] ([id number]) -------------------------------------------------------------------------------- SuperBill Details Patient Name: [name] Date of Service: 07/08/2018 Medical Record Number: [id number] Patient Account Number: [id number] Date of Birth/Sex: 1945-12-25 (72 y.o. 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, sequela T81.31XS Disruption of external operation (surgical) wound, not elsewhere classified, sequela Facility Procedures CPT4 Code: 56213086 Description: 11042 - DEB SUBQ TISSUE 20 SQ CM/< ICD-10 Diagnosis Description L97.312 Non-pressure chronic ulcer of right ankle with fat layer ex Modifier: posed Quantity: 1 CPT4 Code: 57846962 Description: 11045 - DEB SUBQ TISS EA ADDL 20CM ICD-10 Diagnosis Description L97.312 Non-pressure chronic ulcer of right ankle with fat layer ex Modifier: posed Quantity: 1 Physician Procedures CPT4 Code: 9528413 Description: 11042 - WC PHYS SUBQ TISS 20 SQ CM ICD-10 Diagnosis Description L97.312 Non-pressure chronic ulcer of right ankle with fat layer exp Modifier: osed Quantity: 1 CPT4 Code: 2440102 Description: 11045 - WC PHYS SUBQ TISS EA ADDL 20 CM ICD-10 Diagnosis Description L97.312 Non-pressure chronic ulcer of right ankle with fat layer exp Modifier: osed Quantity: 1 Electronic Signature(s) Signed: 07/08/2018 9:12:01 AM By: Lenda Kelp PA-C Entered By: Lenda Kelp on 07/08/2018 09:08:15

## 2018-07-19 ENCOUNTER — Emergency Department (HOSPITAL_COMMUNITY)
Admission: EM | Admit: 2018-07-19 | Discharge: 2018-07-19 | Disposition: A | Discharge status: Home / Self Care | Payer: Medicare HMO | Attending: Emergency Medicine | Admitting: Emergency Medicine

## 2018-07-19 ENCOUNTER — Other Ambulatory Visit: Payer: Self-pay

## 2018-07-19 ENCOUNTER — Encounter (HOSPITAL_COMMUNITY): Payer: Self-pay

## 2018-07-19 DIAGNOSIS — Y929 Unspecified place or not applicable: Secondary | ICD-10-CM | POA: Insufficient documentation

## 2018-07-19 DIAGNOSIS — S51812A Laceration without foreign body of left forearm, initial encounter: Secondary | ICD-10-CM | POA: Insufficient documentation

## 2018-07-19 DIAGNOSIS — Z79899 Other long term (current) drug therapy: Secondary | ICD-10-CM | POA: Diagnosis not present

## 2018-07-19 DIAGNOSIS — Y939 Activity, unspecified: Secondary | ICD-10-CM | POA: Insufficient documentation

## 2018-07-19 DIAGNOSIS — Z7901 Long term (current) use of anticoagulants: Secondary | ICD-10-CM | POA: Insufficient documentation

## 2018-07-19 DIAGNOSIS — S41112A Laceration without foreign body of left upper arm, initial encounter: Secondary | ICD-10-CM

## 2018-07-19 DIAGNOSIS — W260XXA Contact with knife, initial encounter: Secondary | ICD-10-CM | POA: Insufficient documentation

## 2018-07-19 DIAGNOSIS — Y998 Other external cause status: Secondary | ICD-10-CM | POA: Insufficient documentation

## 2018-07-19 DIAGNOSIS — Z87891 Personal history of nicotine dependence: Secondary | ICD-10-CM | POA: Insufficient documentation

## 2018-07-19 DIAGNOSIS — Z86718 Personal history of other venous thrombosis and embolism: Secondary | ICD-10-CM | POA: Diagnosis not present

## 2018-07-19 MED ORDER — CEPHALEXIN 500 MG PO CAPS: 500.0000 mg | ORAL_CAPSULE | Freq: Two times a day (BID) | ORAL | 0 refills | Status: DC

## 2018-07-19 MED ORDER — DOUBLE ANTIBIOTIC 500-10000 UNIT/GM EX OINT
TOPICAL_OINTMENT | Freq: Once | CUTANEOUS | Status: AC
  Administered 2018-07-19: 2 via TOPICAL
  Filled 2018-07-19: qty 1

## 2018-07-19 MED ORDER — DOUBLE ANTIBIOTIC 500-10000 UNIT/GM EX OINT: TOPICAL_OINTMENT | Freq: Once | CUTANEOUS | Status: DC

## 2018-07-19 NOTE — ED Provider Notes (Signed)
[name]   CSN: [id number] Arrival date & time: 07/19/18  1804     History   Chief Complaint Chief Complaint  Patient presents with  . Laceration    HPI [name] is a 72 y.o. male.  Patient cut his left forearm 2 days ago.  Patient does not have a hand on that left arm and has a superficial laceration that is about 3 cm distal forearm  The history is provided by the patient. No language interpreter was used.  Laceration   The incident occurred 2 days ago. Pain location: Left forearm. The laceration is 3 cm in size. The laceration mechanism was a a clean knife. The pain is at a severity of 3/10. The pain is mild. The pain has been constant since onset. He reports no foreign bodies present. His tetanus status is UTD.    Past Medical History:  Diagnosis Date  . Allergy   . DVT (deep venous thrombosis) (HCC)   . Peripheral artery disease (HCC)   . Status post ablation of incompetent vein using laser    Left Leg  . Varicose veins of both lower extremities     Patient Active Problem List   Diagnosis Date Noted  . Swelling of limb 09/11/2016  . Lower limb ulcer, ankle, right, with fat layer exposed (HCC) 09/11/2016  . Varicose veins of lower extremities with ulcer (HCC) 09/11/2016    Past Surgical History:  Procedure Laterality Date  . CHOLECYSTECTOMY    . FOOT SURGERY Right   . HAND AMPUTATION Left   . IVC FILTER PLACEMENT ([facility] HX)    . SPLENECTOMY, TOTAL          Home Medications    Prior to Admission medications   Medication Sig Start Date End Date Taking? Authorizing Provider  ALPRAZolam Prudy Feeler(XANAX) 0.5 MG tablet  09/24/16   [provider]  apixaban (ELIQUIS) 5 MG TABS tablet Take by mouth.    [provider]  cephALEXin (KEFLEX) 500 MG capsule Take 1 capsule (500 mg total) by mouth 2 (two) times daily. 07/19/18   Bethann BerkshireZammit, Velora Horstman, MD  cetirizine (ZYRTEC) 10 MG tablet Take by mouth.    [provider]  Cyanocobalamin (B-12) 1000 MCG CAPS Take by mouth. 02/06/10   [provider]  folic acid (FOLVITE) 1 MG tablet Take by mouth. 02/06/10   [provider]  Macitentan 10 MG TABS Take by mouth.    [provider]  Multiple Vitamin (MULTI-VITAMINS) TABS Take by mouth.    [provider]  Omega-3 Fatty Acids (FISH OIL PO) Take by mouth.    [provider]  rivaroxaban (XARELTO) 10 MG TABS tablet Take 10 mg by mouth daily.    [provider]  tadalafil, PAH, (ADCIRCA) 20 MG tablet Take by mouth.    [provider]    Family History No family history on file.  Social History Social History   Tobacco Use  . Smoking status: Former Games developermoker  . Smokeless tobacco: Never Used  Substance Use Topics  . Alcohol use: No  . Drug use: No     Allergies   Patient has no known allergies.   Review of Systems Review of Systems  Constitutional: Negative for appetite change and fatigue.  HENT: Negative for congestion, ear discharge and sinus pressure.   Eyes: Negative for discharge.  Respiratory: Negative for cough.   Cardiovascular: Negative for chest pain.  Gastrointestinal: Negative for abdominal pain  and diarrhea.  Genitourinary: Negative for frequency and hematuria.  Musculoskeletal: Negative for back pain.       Laceration left forearm  Skin: Negative for rash.  Neurological: Negative for seizures and headaches.  Psychiatric/Behavioral: Negative for hallucinations.     Physical Exam Updated Vital Signs BP (!) 151/77 (BP Location: Right Arm)   Pulse 88   Temp 98.2 F (36.8 C) (Oral)   Resp 16   Ht 6\' 4"  (1.93 m)   Wt 98.9 kg   SpO2 96%   BMI 26.54 kg/m   Physical Exam  Constitutional: He is oriented to person, place, and time. He appears well-developed.  HENT:  Head: Normocephalic.  Eyes: Conjunctivae and EOM are normal. No scleral icterus.  Neck: Neck supple. No thyromegaly present.    Cardiovascular: Normal rate and regular rhythm. Exam reveals no gallop and no friction rub.  No murmur heard. Pulmonary/Chest: No stridor. He has no wheezes. He has no rales. He exhibits no tenderness.  Abdominal: He exhibits no distension. There is no tenderness. There is no rebound.  Musculoskeletal: Normal range of motion. He exhibits no edema.  3 cm laceration left forearm superficial area looks clean  Lymphadenopathy:    He has no cervical adenopathy.  Neurological: He is oriented to person, place, and time. He exhibits normal muscle tone. Coordination normal.  Skin: No rash noted. No erythema.  Psychiatric: He has a normal mood and affect. His behavior is normal.     ED Treatments / Results  Labs (all labs ordered are listed, but only abnormal results are displayed) Labs Reviewed - No data to display  EKG None  Radiology No results found.  Procedures Procedures (including critical care time)  Medications Ordered in ED Medications - No data to display   Initial Impression / Assessment and Plan / ED Course  I have reviewed the triage vital signs and the nursing notes.  Pertinent labs & imaging results that were available during my care of the patient were reviewed by me and considered in my medical decision making (see chart for details).     She was 64 days old laceration to left forearm.  He will continue to clean it 2-3 times a day with soap and water or peroxide.  He is given some Keflex to be taken for 5 days and he will follow-up with his doctor next week  Final Clinical Impressions(s) / ED Diagnoses   Final diagnoses:  Laceration of left upper extremity, initial encounter    ED Discharge Orders         Ordered    cephALEXin (KEFLEX) 500 MG capsule  2 times daily     07/19/18 Elna Breslow, MD 07/19/18 1836

## 2018-07-19 NOTE — ED Notes (Signed)
Dr Estell HarpinZammit has assessed

## 2018-07-19 NOTE — ED Notes (Signed)
Dr Z in to assess 

## 2018-07-19 NOTE — ED Notes (Signed)
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   Wound is open and would have required stitches had he come on thursday

## 2018-07-19 NOTE — ED Triage Notes (Signed)
PT acidentally cut left forearm with a utility knife Thursday.  Wife says she has been cleaning it but now it is swollen.  Pt denies pain.

## 2018-07-19 NOTE — Discharge Instructions (Addendum)
Clean area 2-3 times a day with peroxide or soap and water.  Put dressing on after cleaning laceration.  Follow-up beginning of next week with your doctor for recheck

## 2018-07-22 ENCOUNTER — Encounter: Payer: Medicare HMO | Admitting: Physician Assistant

## 2018-07-22 DIAGNOSIS — I87331 Chronic venous hypertension (idiopathic) with ulcer and inflammation of right lower extremity: Secondary | ICD-10-CM | POA: Diagnosis not present

## 2018-07-24 NOTE — Progress Notes (Signed)
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 States his pain level is better 02/29/16; still complaining of a lot of drainage even though we are putting absorbent material over the Santyl and bringing him back on Fridays for dressing changes. He is not complaining of pain. Her intake nurse notes blistering 03/07/16: pt  returns today for f/u. he admits  out in rain on Saturday and soaked his right leg. he did not share with his wife and he didn't notify the [facility]. he has an odor today that is c/w pseudomonas. Wound has greenish tan slough. there is no periwound erythema, induration, or fluctuance. wound has deteriorated since previous visit. denies fever, chills, body aches or malaise. no increased pain. 03/13/16: C+S showed proteus. He has not received AB'S. Switched to RTD last week. 03/27/16 patient is been using Iodoflex. Wound bed has improved and debridement is certainly easier 04/10/2016 -- he has been scheduled for a venous duplex study towards the end of the month 04/17/16; has been using silver alginate, states that the Iodoflex was hurting his wound and since that is been changed he has had no pain unfortunately the surface of the wound continues to be unhealthy with thick gelatinous slough and nonviable tissue. The wound will not heal like this. 04/20/2016 -- the patient was here for a nurse visit but I was asked to see the patient as the slough was quite significant and the nurse needed for clarification regarding the ointment to be used. 04/24/16; the patient's wounds on the right medial and right lateral ankle/malleolus both look a lot better today. Less adherent slough healthier tissue. Dimensions better especially medially 05/01/16; the patient's wound surface continues to improve however he continues to require debridement switch her easier each week. Continue Santyl/Metahydrin mixture Hydrofera Blue next week. Still drainage on the medial aspect according to the intake nurse 05/08/16; still using Santyl and Medihoney. Still a lot of drainage per her intake nurse. Patient has no complaints pain fever chills etc. 05/15/16 switched the Hydrofera Blue last week. Dimensions down especially in the medial right leg wound. Area on the lateral which is more substantial also looks better still requires  debridement 05/22/16; we have been using Hydrofera Blue. 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 Currently he tells me that the pain is doing very well about a 1 out of 10 today. Yesterday was a little bit worse but he tells me that he was more active watering his flowers that day. Overall he feels that his symptoms are doing significantly better at this point in time. His edema continues to be controlled well with the 4-layer compression wrap and he really has not noted any odor at this point in time. He is tolerating the dressing changes when they are performed well. 06/05/16 at this point in time today patient currently shows no interval signs or symptoms of local or systemic infection. Again his pain level he rates to be a 1 out of 10 at most and overall he tells me that generally this is not giving him much trouble. In fact he even feels maybe a little bit better than last week. We have continue with the 4-layer compression wrap in which she tolerates very well at this point. He is continuing to utilize the [place]. 06/12/16 I think there has been some progression in the status of both of these wounds over today again covered in a gelatinous surface. Has been using Hydrofera Blue. We had used Iodoflex in the past I'm not sure if there was an issue other than changing to something that might progress towards closure faster 06/19/16; he did not tolerate the Flexeril last week secondary to pain and this  was changed on Friday back to [facility] area he continues to have copious amounts of gelatinous surface slough which is think inhibiting the speed of healing this area 06/26/16 patient over the last week has utilized the Santyl to try to loosen up some of the tightly  adherent slough that was noted on evaluation last week. The good news is he tells me that the medial malleoli region really does not bother him the right llateral malleoli region is more tender to palpation at this point in time especially in the central/inferior location. However it does appear that the Santyl has done his job to loosen up the adherent slough at this point in time. Fortunately he has no interval signs or symptoms of infection locally or systemically no purulent discharge noted. 07/03/16 at this point in time today patient's wounds appear to be significantly improved over the right medial and lateral malleolus locations. He has much less tenderness at this point in time and the wounds appear clean her although there is still adherent slough this is sufficiently improved over what I saw last week. I still see no evidence of local infection. 07/10/16; continued gradual improvement in the right medial and lateral malleolus locations. The lateral is more substantial wound now divided into 2 by a rim of normal epithelialization. 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 He denies any complaints of pain or intolerance to compression. Both ulcers are smaller in size oriented today's measurements, both are covered with a softly adherent slough. 07/24/16; medial wound is smaller, lateral about the same although surface looks better. Still using Hydrofera Blue 07/31/16; arrives today complaining of pain in the lateral part of his foot. Nurse reports a lot more drainage. He has been using Hydrofera Blue. Switch to silver alginate today 08/03/2016 -- I was asked to see the patient was here for a nurse visit today. I understand he had a lot of pain in his right lower extremity and was having blisters on his right foot which have not been there before. Though he started on  doxycycline he does not have blisters elsewhere on his body. I do not believe this is a drug allergy. also mentioned that there was a copious purulent discharge from the wound and clinically there is no evidence of cellulitis. 08/07/16; I note that the patient came in for his nurse check on Friday apparently with blisters on his toes on the right than a lot of swelling in his forefoot. He continued on the doxycycline that I had prescribed on 12/8. A culture was done of the lateral wound that showed a combination of a few Proteus and Pseudomonas. Doxycycline might of covered the Proteus but would be unlikely to cover the Pseudomonas. He is on Coumadin. He arrives in the clinic today feeling a lot better states the pain is a lot better but nothing specific really was done other than to rewrap the foot also noted that he had arterial studies ordered in August although these were never done. It is reasonable to go ahead and reorder these. 08/14/16; generally arrives in a better state today in terms of the wounds he has taken cefdinir for one week. Our intake nurse reports copious amounts of drainage but the patient is complaining of much less pain. He is not had his PT and INR checked and I've asked him to do this today or tomorrow. 08/24/2016 -- patient arrives  today after 10 days and said he had a stomach upset. His arterial study was done and I have reviewed this report and find it to be within normal limits. However I did not note any venous duplex studies for reflux, and Dr. Leanord Hawking may have ordered these in the past but I will leave it to him to decide if he needs these. The patient has finished his course of cefdinir. 08/28/16; patient arrives today again with copious amounts of thick really green drainage for our intake nurse. He states he has a very tender spot at the superior part of the lateral wound. Wounds are larger 09/04/16; no real change in the condition of this patient's wound still  copious amounts of surface slough. Started him on Iodoflex last week he is completing another course of Cefdinir or which I think was done empirically. His arterial study showed ABIs were 1.1 on the right 1.5 on the left. He did have a slightly reduced ABI in the right the left one was not obtained. Had calcification of the right posterior tibial artery. The interpretation was no segmental stenosis. His waveforms were triphasic. GRASON, BRAILSFORD ([id number]) His reflux studies are later this month. Depending on this I'll send him for a vascular consultation, he may need to see plastic surgery as I believe he is had plastic surgery on this foot in the past. He had an injury to the foot in the 1980s. 1/16 /18 right lateral greater than right medial ankle wounds on the right in the setting of previous skin grafting. 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 He does not have arterial issues. Each week he comes in with the same adherent surface slough although there was less of this today 09/18/16; right lateral greater than right medial lower extremity wounds in the setting of previous skin grafting and trauma. He has least to vein laser ablation scheduled for February 2 for venous reflux. He does not have significant arterial disease. Problem has been very difficult to handle surface slough/necrotic tissue. Recently using Iodoflex for this with some, albeit slow improvement 09/25/16; right lateral greater than right medial lower extremity venous wounds in the setting of previous skin grafting. He is going for ablation surgery on February 2 after this he'll come back here for rewrap. He has been using Iodoflex as the primary dressing. 10/02/16; right lateral greater than right medial lower extremity wounds in the setting of previous skin grafting. He had his ablation surgery last week, I don't have a report. He tolerated  this well. Came in with a thigh-high Unna boots on Friday. We have been using Iodoflex as the primary dressing. His measurements are improving 10/09/16; continues to make nice aggressive in terms of the wounds on his lateral and medial right ankle in the setting of previous skin grafting. Yesterday he noticed drainage at one of his surgical sites from his venous ablation on the right calf. He took off the bandage over this area felt a "popping" sensation and a reddish-brown drainage. He is not complaining of any pain 10/16/16; he continues to make nice progression in terms of the wounds on the lateral and medial malleolus. Both smaller using Iodoflex. He had a surgical area in his posterior mid calf we have been using iodoform. All the wounds are down and dimensions 10/23/16; the patient arrives today with no complaints. He states the Iodoflex is a bit  uncomfortable. He is not systemically unwell. We have been using Iodoflex to the lateral right ankle and the medial and Aquacel Ag to the reflux surgical wound on the posterior right calf. All of these wounds are doing well 10/30/16; patient states he has no pain no systemic symptoms. I changed him to [facility]. Although the wounds are doing well 11/06/16; patient reports no pain or systemic symptoms. We continue with Hydrofera Blue. Both wound areas on the medial and lateral ankle appear to be doing well with improvement and dimensions and improvement in the wound bed. 11/13/16; patient's dimensions continued to improve. We continue with Hydrofera Blue on the medial and lateral side. Appear to be doing well with healthy granulation and advancing epithelialization 11/20/16; patient's dimensions improving laterally by about half a centimeter in length. Otherwise no change on the medial side. [facility] 12/04/16; no major change in patient's wound dimensions. Intake nurse reports more drainage. The patient states no pain, no systemic  symptoms including fever or chills 11/27/16- patient is here for follow-up evaluation of his bimalleolar ulcers. He is voicing no complaints or concerns. He has been tolerating his twice weekly compression therapy changes 12/11/16 Patient complains of pain and increased drainage.. wants hydrofera blue 12/18/16 improvement. Sorbact 12/25/16; medial wound is smaller, lateral measures the same. Still on sorbact 01/01/17; medial wound continues to be smaller, lateral measures about the same however there is clearly advancing epithelialization here as well in fact I think the wound will ultimately divided into 2 open areas 01/08/17; unfortunately today fairly significant regression in several areas. Surface of the lateral wound covered again in adherent necrotic material which is difficult to debridement. He has significant surrounding skin maceration. The expanding area of tissue epithelialization in the middle of the wound that was encouraging last week appears to be smaller. There is no surrounding tenderness. The area on the medial leg also did not seem to be as healthy as last week, the reason for this regression this week is not totally clear. We have been using Sorbact for the last 4 weeks. We'll switched of polymen AG which we will order via home medical supply. If there is a problem with this would switch back to Iodoflex 01/15/18; drainage,odor. No change. Switched to polymen last week 01/22/17; still continuous drainage. Culture I did last week showed a few Proteus pansensitive. I did this culture because of drainage. Put him on Augmentin which she has been taking since Saturday however he is developed 4-5 liquid bowel movements. He is also on Coumadin. Beyond this wound is not changed at all, still nonviable necrotic surface material which I debrided reveals healthy granulation line 01/29/17; still copious amounts of drainage reported by her intake nurse. Wound measuring slightly smaller. Currently fact  on Iodoflex although I'm looking forward to changing back to perhaps [facility]fer[facility] Blue or polymen AG 02/05/17; still large amounts of drainage and presenting with really large amounts of adherent slough and necrotic material over the remaining open area of the wound. We have been using Iodoflex but with little improvement in the surface. Change to [facility] 02/12/17; still large amount of drainage. Much less adherent slough however. Started [employer]	[place] [employer] last week [name] StackBLACKWELL, Travian C. ([id number]) 02/19/17; drainage is better this week. Much less adherent slough. Perhaps some improvement in dimensions. Using [facility] 02/26/17; severe venous insufficiency wounds on the right lateral and right medial leg. Drainage is some better and slough is less adherent we've been using Hydrofera Blue 03/05/17  on evaluation today patient appears to be doing well. His wounds have been decreasing in size and overall he is pleased with how this is progressing. We are awaiting approval for the epigraph which has previously been recommended in the meantime the [facility] Dressing is to be doing very well for him. 03/12/17; wound dimensions are smaller still using Hydrofera Blue. Comes in on Fridays for a dressing change. 03/19/17; wound dimensions continued to contract. Healing of this wound is complicated by continuous significant drainage as well as recurrent buildup of necrotic surface material. We looked into Apligraf and he has a $290 co-pay per application but truthfully I think the drainage as well as the nonviable surface would preclude use of Apligraf there are any other skin substitute at this point therefore the continued plan will be debridement each clinic visit, 2 times a week dressing changes and continued use of Hydrofera Blue. improvement has been very slow but sustained 03/26/17 perhaps slight improvements in peripheral epithelialization is especially inferiorly. Still with large amount of  drainage and tightly adherent necrotic surface on arrival. Along with the intake nurse I reviewed previous treatment. He worsened on Iodoflex and had a 4 week trial of sorbact, Polymen AG and a long courses of Aquacel Ag. He is not a candidate for advanced treatment options for many reasons 04/09/17 on evaluation today patient's right lower extremity wounds appear to be doing a little better. Fortunately he has no significant discomfort and has been tolerating the dressing changes including the wraps without complication. With that being said he really does not have swelling anymore compared to what he has had in the past following his vascular intervention. He wonders if potentially we could attempt avoiding the rats to see if he could cleanse the wound in between to try to prevent some of the fiber and its buildup that occurs in the interim between when we see him week to week. No fevers, chills, nausea, or vomiting noted at this time. 04/16/17; noted that the staff made a choice last week not to put him in compression. The patient is changing his dressing at home using [facility] and changing every second day. His wife is washing the wound with saline. He is using kerlix. Surprisingly he has not developed a lot of edema. This choice was made because of the degree of fluid retention and maceration even with changing his dressing twice a week 04/23/17; absolutely no change. Using Hydrofera Blue. Recurrent tightly adherent nonviable surface material. This is been refractory to Iodoflex, sorbact and now Hydrofera Blue. More recently he has been changing his own dressings at home, cleansing the wound in the shower. He has not developed lower extremity edema 04/30/17; if anything the wound is larger still in adherent surface although it debrided easily today. I've been using Mehta honey for 1 week and I'd like to try and do it for a second week this see if we can get some form of viable surface  here. 05/07/17; patient arrives with copious amounts of drainage and some pain in the superior part of the wound. He has not been systemically unwell. He's been changing this daily at home 05/14/17; patient arrives today complaining of less drainage and less pain. Dimensions slightly better. Surface culture I did of this last week grew MSSA I put him on dicloxacillin for 10 days. Patient requesting a further prescription since he feels so much better. He did not obtain the kerramax AG for reasons that aren't clear, he has been  using Hydrofera Blue 05/21/17; perhaps some less drainage and less pain. He is completing another week of doxycycline. Unfortunately there is no change in the wound measurements are appearance still tightly adherent necrotic surface material that is really defied treatment. We have been using Hydrofera Blue most recently. He did not manage to get Anasept. He was told it was prescription at [facility] 05/29/17; absolutely no improvement in these wound areas. He had remote plastic surgery with who was done at [facility] in [place] surrounding this area. This was a traumatic wound with extensive plastic surgery/skin grafting at that time. I switched him to sorbact last week to see if he can do anything about the recurrent necrotic surface and drainage. This is largely been refractory to Iodoflex/Hydrofera Blue/alginates. I believe we tried Elby Showers and more recently Medi honey l however the drainage is really too excessive 06/04/17; patient went to see Dr. Ulice Bold. Per the patient she ordered him a compression stocking. Continued the Anasept gel and sorbact was already on. No major improvement in the condition of the wounds in fact the medial wound looks larger. Again per the patient she did not feel a skin graft or operative debridement was indicated The patient had previous venous ablation in February 2018. Last arterial studies were in December 2017 and were within normal  limits 06/18/17; patient arrives back in clinic today using Anasept gel with sorbact. He changes this every day is wearing his own compression stocking. The patient actually saw Dr. Leta Baptist of plastic surgery. I've reviewed her note. The appointment was on 05/30/17. The basic issue with here was that she did not feel that skin grafts for patients with underlying stasis and edema have a good long-term success rate. She also discuss skin substitutes which has been done here as well however I have not been able to get the surface of these wounds to something that I think would support skin substitutes. This is why he felt he might need an [name], [name]. ([id number]) operative debridement more aggressively than I can do in this clinic Review of systems; is otherwise negative he feels well 07/02/17; patient has been using Anasept gel with Sorbact. He changes this every day scrubs out the wound beds. Arrives today with the wound bed looking some better. Easier debridement 07/16/17; patient has been using Anasept gel was sorbact for about a month now. The necrotic service on his wound bed is better and the drainage is now down to minimal but we've not made any improvements in the epithelialization. Change to Santyl today. Surface of the wound is still not good enough to support skin substitutes 07/30/17 on evaluation today patient appears to be doing very well in regard to his right bimalleolus wounds. He has been tolerating the treatment with the Anasept gel and sorbact. However we were going to try and switch to Santyl to be more aggressive with these wounds. This was however cost prohibitive. Therefore we will likely go back to the sorbact. Nonetheless he is not having any significant discomfort he still is forming slough over the wound location but nothing too significant. The wounds do appear to be filling in nicely. 08/13/17; I have not seen this wound in about a month. There is not much  change. The fact the area medially looks larger. He has been using sorbact and Anasept for over a month now without much effect. Arrives in clinic stating that he does not want the area debrided 08/28/17; patient arrives with the areas on his right  medial and right lateral ankle worse. The wounds of expanded there is erythema and still drainage. There is no pain and no tenderness. We had been using Keracel AG clearly not doing well. 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 He states that even the evening that we began utilizing the Iodoflex he started having burning. Subsequently this never really improved and he eventually discontinue the use of the Iodoflex altogether. Unfortunately he did not let us know about any of this until today. I'm unsure of any specific infection at this point although I am going to obtain a wound culture today in order to see if there's anything that could potentially be causing an infection as well. It does sound as if he's had the Iodoflex before and did not have this kind of reaction although this does sound very specific for a reaction to the Iodoflex. 09/17/17 on evaluation today patient appears to be doing significantly better compared to last week's evaluation. At that time it actually appears that he did have an infection the good news is that we have been able to get this under control with switching to the [facility] solution he's not having as much pain and discomfort he still does have some erythema surrounding the medial aspect wound. He has been tolerating the Dakin's soaked gauze dressing which is excellent and that his wounds do not seem to have as much adherent slough. Overall I'm pleased with the  progress she's made in last week. 11/05/17 on evaluation today patient appears to be doing better in regard to his right medial and lateral malleolus ulcers. He has been tolerating the dressing changes without complication. I do flight the Freada Bergeron is helping with additional granulation at this point in the wound beds do appear to be a little bit better. With that being said patient does not appear to have any evidence of significant infection at this point there is no erythema as he has previously noted he does note that the 30 day course of antibiotics I placed him on will end tomorrow. 11/12/17 on evaluation today patient actually appears to be doing excellent in regard to his right medial and lateral malleolus ulcers. He has been tolerating the dressing changes without complication. Fortunately he has no evidence of infection at this time and overall I believe he is progressing extremely nicely he has a lot of new epithelialization noted on evaluation today. Patient likewise is very pleased with the progress even just since last week. 11/19/17 on evaluation today patient appears to be doing about the same in regard to his ulcerations. He does not have any additional breakdown although he really has not noted any significant improvement either over the past week. With that being said the more concerning thing at this time is that he is beginning to show signs of erythema surrounding both wounds which is what typically happens and then he will develop into a more overt infection. This has been the course for some time. I hope that doing the 30 day in a biotic cycle this past time would break that so that he would not have the same type of thing happen again. Nonetheless it appears that is digressing back into this infected state unfortunately. With  that being said he is not having any pain currently which is good he typically does start to hurt more as this progresses. Fortunately there does not appear  to be any evidence of infection systemically which is good news. 11/26/17 on evaluation today patient appears to actually be doing rather well in regard to his ulcer compared to last week. He still has your theme and noted around both the medial and lateral malleolus ulcers of the right lower extremity. He does not seem to be quite as overtly infected however. He has been tolerating the dressing changes without complication which is good news. The gentamicin cream does seem to have been of benefit for him. With that being said he does actually have an appointment with Dr. Sampson Goon this morning to see if there's anything that would be recommended in the way of IV antibiotic [name], [name] therapy. Otherwise he still has slough noted on the surface of the wound. 12/03/17 on evaluation today patient presents for follow-up concerning his ongoing right lower extremity ulcers. He has been tolerating the dressing changes without complication he states he has not really been using gentamicin on the lateral ankle and this seems to be doing very well. For that reason I think that is probably fine to put a hold on the gentamicin he states his burns and stings when he applies it and we maybe be able to just use this intermittently when things become more irritated. Other than that the good news is I did review his MRI which was performed on 11/28/17 and revealed that he has a negative MRI for abscess, osteomyelitis, or septic joint. Obviously these were the issues that I was concerned with the possibility of and I'm pleased to find that there is no problems in that regard. I did also review Dr. Jarrett Ables note from infectious disease he discussed performed some lab work which apparently was done and then subsequently was going to consider the possibility of Keflex long-term for prevention of infection although this has not been prescribed as of yet. 12/09/17 on evaluation today patient appears to  be doing a little bit worse in regard to his right medial and lateral malleolus ulcers. Fortunately the wound does not seem to be significantly worse although he does have a lot more drainage especially the lateral aspect he is having a lot more pain than what he has noted more recently. Fortunately there does not appear to be any evidence of systemic infection which is good news. Again he has seen Dr. Sampson Goon but he has not been placed on the potential Keflex which was recommended as a possibility at least yet. 12/17/17 unfortunately on evaluation today the patient's wound appears to be doing much worse. He has been performing the dressing changes as directed. Upon a review of notes in epic it does appear that Dr. Jarrett Ables office specifically sending Keflex for the patient on the 16th which was last Tuesday. With that being said the patient states that though this was sent into [facility] that [facility] stated they never received it and subsequently the patient never took anything. He tells me he has been in bed since Friday due to the increased pain and overall general malaise. No fevers, chills, nausea, or vomiting noted at this time. 02/25/18 on evaluation today patient appears to be doing rather well in regard to his bilateral right malleolus ulcers. He has been tolerating the dressing changes without complication. Currently there does not appear to be any evidence of infection. Overall  I'm very pleased with the progress that seems to been made up to this point. 03/11/18 on evaluation today patient actually appears to be doing very well in regard to his right ankle ulcers. He's been tolerating the dressing changes without complication. Fortunately there does not appear to be any evidence of sniffing infection I do believe that the gentamicin cream continues to be of benefit for him. Fortunately he is showing signs of healing in which time I see him. He also tells me he's having no pain. 03/25/18  on evaluation today patient actually appears to show signs of improvement especially in regard to the right lateral ankle ulcer. He has been tolerating the dressing changes without complication. In general I'm very pleased with the progress that has been made up to this point. 04/08/18- He presents in follow up evaluation for right bimalleolar wounds; there is essentially no change. He admits to removing the dressing at night to allow the wounds to be "air out". We will continue with same treatment plan and he will follow up in two weeks 04/22/18 on evaluation today patient seems very frustrated with this situation currently as far as the ulcer on his right medial and lateral malleolus is concerned. He states this has been going on a very long time and obviously he does have a lot of bills as well is far as wound care is concerned. With that being said he tells me that he's been tolerating the dressing changes well although he did clarify that what he is doing is putting the medicine on in the morning after shower and then subsequently he takes the dressing off at night leaving it open to air. I previously asked him about this and I was not aware that he was doing this. Nonetheless that may be slowing things down a bit at this point. Fortunately there does not appear to be any evidence of severe infection although he does have some evidence of your theme is surrounding the wound bed. He is still using the gentamicin cream. 05/06/18 on evaluation today patient actually presents for evaluation of his right medial malleolus ulcers. He's been tolerating the dressing changes without complication. With that being said it does appear that he is doing better compared to last evaluation. He's not having the pain like he did previous. He also states that blisters that he had coming up when I saw him last did resolve and ended up doing okay he did go to the ER they gave him some cream to put on it he's not sure  what the name of the cream was. He was down in [facility] when he called me and I spoke with him previous. Nonetheless he states that they told him to put the medicine on his our due list. With that being said I'm not really 100% sure that the medicine had anything to do with this due to the fact that again he was having the blisters when he came into the office which is why we actually put them on the anabiotic I was concerned about him having an infection which was causing the blistering and increased pain in his extremity. Nonetheless this is something that we can definitely be cautious of in the future it was Bactrim Goel, Lin C. ([id number]) that we had him on. Overall I'm glad he is doing better. 05/20/18 on evaluation today patient actually appears to be doing fairly well in regard to his ulcer on the right lateral and medial malleolus locations. Both seem  to be doing decently well he does have slough building up on the surface of the wound. He has been using the Hibiclense as I instructed him. Overall there's no evidence of significant infection which is good news. 06/02/18 on evaluation today patient actually appears to be doing excellent in regard to his right medial and lateral malleolus ulcers. He has been tolerating the dressing changes without complication. There does not appear to be any evidence of infection at this time which is great news. In general I feel like he has made great progress in the past several weeks. He has good granulation noted over areas that have not had good granulation previously and epithelialization even more so in several areas. Both wounds measured smaller. 06/17/18 an evaluation today patient actually appears to be doing excellent in regard to his right lateral and medial malleolus ulcers. He has been tolerating the dressing changes without complication fortunately the Hydrofera Blue Dressing to be doing excellent for him. He's actually making  progress each time I see him. 07/08/18 on evaluation today patient's wounds on the right medial and lateral malleolus or region show signs of improvement currently. He has been tolerating the dressing changes without complication at this time. He does feel like the [facility] Dressing has been of benefit for him. No fevers, chills, nausea, or vomiting noted at this time. 07/22/18 and evaluation today patient actually appears to be doing excellent in regard to his right medial and lateral malleolus ulcers. He's been tolerating the dressing changes without complication the Hydrofera Blue Dressing years be excellent for him. Overall I'm extremely pleased in this regard. Patient History Information obtained from Patient. Social History Former smoker - 10 years ago, Marital Status - Married, Alcohol Use - Moderate, Drug Use - No History, Caffeine Use - Never. Medical And Surgical History Notes Constitutional Symptoms (General Health) Vein Filter (groin area); CODA; H/O Blood Clots; pulmonary hypertensive arterial disease Review of Systems (ROS) Constitutional Symptoms (General Health) Denies complaints or symptoms of Fever, Chills. Respiratory The patient has no complaints or symptoms. Cardiovascular The patient has no complaints or symptoms. Psychiatric The patient has no complaints or symptoms. Objective Constitutional Well-nourished and well-hydrated in no acute distress. [name], [name] ([id number]) Vitals Time Taken: 8:25 AM, Height: 76 in, Weight: 238 lbs, BMI: 29, Temperature: 98.4 F, Pulse: 78 bpm, Respiratory Rate: 18 breaths/min, Blood Pressure: 133/62 mmHg. Respiratory normal breathing without difficulty. Psychiatric this patient is able to make decisions and demonstrates good insight into disease process. Alert and Oriented x 3. pleasant and cooperative. General Notes: Upon inspection today patient's wound did require sharp debridement. I was able to remove slough  and necrotic debris from the surface of the wound down to good subcutaneous tissue which he tolerated without complication. Post debridement the wound bed was excellent. Integumentary (Hair, Skin) Wound #1 status is Open. Original cause of wound was Gradually Appeared. The wound is located on the Right,Lateral Malleolus. The wound measures 4cm length x 4cm width x 0.2cm depth; 12.566cm^2 area and 2.513cm^3 volume. There is Fat Layer (Subcutaneous Tissue) Exposed exposed. There is no tunneling or undermining noted. There is a medium amount of serosanguineous drainage noted. The wound margin is flat and intact. There is small (1-33%) pink granulation within the wound bed. There is a large (67-100%) amount of necrotic tissue within the wound bed including Adherent Slough. The periwound skin appearance exhibited: Dry/Scaly, Hemosiderin Staining. The periwound skin appearance did not exhibit: Callus, Crepitus, Excoriation, Induration, Rash, Scarring, Maceration, Atrophie  Blanche, Cyanosis, Ecchymosis, Mottled, Pallor, Rubor, Erythema. Wound #2 status is Open. Original cause of wound was Gradually Appeared. The wound is located on the Right,Medial Malleolus. The wound measures 3.1cm length x 2.2cm width x 0.2cm depth; 5.356cm^2 area and 1.071cm^3 volume. There is Fat Layer (Subcutaneous Tissue) Exposed exposed. There is no tunneling or undermining noted. There is a medium amount of serosanguineous drainage noted. The wound margin is flat and intact. There is small (1-33%) pink granulation within the wound bed. There is a large (67-100%) amount of necrotic tissue within the wound bed including Adherent Slough. The periwound skin appearance exhibited: Dry/Scaly, Hemosiderin Staining. The periwound skin appearance did not exhibit: Callus, Crepitus, Excoriation, Induration, Rash, Scarring, Maceration, Atrophie Blanche, Cyanosis, Ecchymosis, Mottled, Pallor, Rubor, Erythema. 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 There was a Excisional Skin/Subcutaneous Tissue Debridement with a total area of 16 sq [name], [name] cm performed by STONE III, Math Brazie E., PA-C. With the following instrument(s): Curette to remove Viable and Non-Viable tissue/material. Material removed includes Subcutaneous Tissue and Slough and after achieving pain control using Lidocaine 4% Topical Solution. No specimens were taken. A time out was conducted at 09:03, prior to the start of the procedure. A Minimum amount of bleeding was controlled with Pressure. The procedure was tolerated well with a pain level of 0 throughout and a pain level of 0 following the procedure. Post Debridement Measurements: 4cm length x 4cm width x 0.3cm depth; 3.77cm^3 volume. Character of Wound/Ulcer Post Debridement is improved. Severity of Tissue Post Debridement is: Fat layer exposed. 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 There was a Excisional Skin/Subcutaneous Tissue Debridement with a total area of 6.82 sq cm performed by STONE III, [name] Rippeon E., PA-C. With the following instrument(s): Curette to remove Viable and Non-Viable tissue/material. Material removed includes Subcutaneous Tissue and Slough and after achieving pain control using Lidocaine 4%  Topical Solution. No specimens were taken. A time out was conducted at 09:01, prior to the start of the procedure. A Minimum amount of bleeding was controlled with Pressure. The procedure was tolerated well with a pain level of 0 throughout and a pain level of 0 following the procedure. Post Debridement Measurements: 3.1cm length x 2.2cm width x 0.3cm depth; 1.607cm^3 volume. Character of Wound/Ulcer Post Debridement is improved. Severity of Tissue Post Debridement is: Fat layer exposed. Post procedure Diagnosis Wound #2: Same as Pre-Procedure Plan Wound Cleansing: Wound #1 Right,Lateral Malleolus: Clean wound with Normal Saline. May Shower, gently pat wound dry prior to applying new dressing. - wash with hibiclens daily and leave on 5 mins Wound #2 Right,Medial Malleolus: Clean wound with Normal Saline. May Shower, gently pat wound dry prior to applying new dressing. - wash with hibiclens and leave on 5 mins Anesthetic (add to Medication List): Wound #1 Right,Lateral Malleolus: Topical Lidocaine 4% cream applied to wound bed prior to debridement (In Clinic Only). Wound #2 Right,Medial Malleolus: Topical Lidocaine 4% cream applied to  wound bed prior to debridement (In Clinic Only). Primary Wound Dressing: Wound #1 Right,Lateral Malleolus: Hydrafera Blue Ready Transfer Wound #2 Right,Medial Malleolus: Hydrafera Blue Ready Transfer Secondary Dressing: Wound #1 Right,Lateral Malleolus: Dry Gauze Conform/Kerlix Wound #2 Right,Medial Malleolus: Dry Gauze Conform/Kerlix Dressing Change Frequency: Wound #1 Right,Lateral Malleolus: Change dressing every other day. Wound #2 Right,Medial Malleolus: Change dressing every other day. Follow-up Appointments: JENNY, OMDAHL ([id number]) Wound #1 Right,Lateral Malleolus: Return Appointment in 2 weeks. Wound #2 Right,Medial Malleolus: Return Appointment in 2 weeks. Edema Control: Wound #1 Right,Lateral Malleolus: Elevate legs to the  level of the heart and pump ankles as often as possible Wound #2 Right,Medial Malleolus: Elevate legs to the level of the heart and pump ankles as often as possible Additional Orders / Instructions: Wound #1 Right,Lateral Malleolus: Increase protein intake. Wound #2 Right,Medial Malleolus: Increase protein intake. I'm gonna suggest at this point that we go ahead and continue with the above wound care measures since the patient seems to be doing well. He's in agreement with this plan. If anything changes or worsens meantime he will contact the office and let me know otherwise will see him back for reevaluation. Please see above for specific wound care orders. We will see patient for re-evaluation in 2 week(s) here in the clinic. If anything worsens or changes patient will contact our office for additional recommendations. Electronic Signature(s) Signed: 07/23/2018 12:54:11 AM By: Lenda Kelp PA-C Entered By: Lenda Kelp on 07/22/2018 11:18:58 [name] ([id number]) -------------------------------------------------------------------------------- ROS/PFSH Details Patient Name: [name] Date of Service: 07/22/2018 8:15 AM Medical Record Number: [id number] Patient Account Number: [id number] Date of Birth/Sex: [birth year] (72 y.o. M) Treating RN: Curtis Sites Primary Care Provider: [name] Other Clinician: Referring Provider: [name] Treating Provider/Extender: STONE III, Onda Kattner Weeks in Treatment: 131 Information Obtained From Patient Wound History Do you currently have one or more open woundso Yes How many open wounds do you currently haveo 1 Approximately how long have you had your woundso 5 months How have you been treating your wound(s) until nowo saline, dressing Has your wound(s) ever healed and then re-openedo Yes Have you had any lab work done in the past montho No Have you tested positive for an antibiotic resistant organism (MRSA, VRE)o  No Have you tested positive for osteomyelitis (bone infection)o No Have you had any tests for circulation on your legso No Constitutional Symptoms (General Health) Complaints and Symptoms: Negative for: Fever; Chills Medical History: Past Medical History Notes: Vein Filter (groin area); CODA; H/O Blood Clots; pulmonary hypertensive arterial disease Eyes Medical History: Positive for: Cataracts - removed Negative for: Glaucoma; Optic Neuritis Ear/Nose/Mouth/Throat Medical History: Negative for: Chronic sinus problems/congestion Hematologic/Lymphatic Medical History: Negative for: Anemia; Hemophilia; Human Immunodeficiency Virus; Lymphedema; Sickle Cell Disease Respiratory Complaints and Symptoms: No Complaints or Symptoms Medical History: Positive for: Chronic Obstructive Pulmonary Disease (COPD) Negative for: Aspiration; Asthma; Pneumothorax; Sleep Apnea; Tuberculosis Cardiovascular DAIVEON, MARKMAN. ([id number]) Complaints and Symptoms: No Complaints or Symptoms Medical History: Negative for: Angina; Arrhythmia; Congestive Heart Failure; Coronary Artery Disease; Deep Vein Thrombosis; Hypertension; Hypotension; Myocardial Infarction; Peripheral Arterial Disease; Peripheral Venous Disease; Phlebitis; Vasculitis Gastrointestinal Medical History: Negative for: Cirrhosis ; Colitis; Crohnos; Hepatitis A; Hepatitis B; Hepatitis C Endocrine Medical History: Negative for: Type I Diabetes; Type II Diabetes Genitourinary Medical History: Negative for: End Stage Renal Disease Immunological Medical History: Negative for: Lupus Erythematosus; Raynaudos Integumentary (Skin) Medical History: Negative for: History of Burn; History of pressure wounds Musculoskeletal Medical History: Positive for:  Osteoarthritis Negative for: Gout; Rheumatoid Arthritis; Osteomyelitis Neurologic Medical History: Negative for: Dementia; Neuropathy; Quadriplegia; Paraplegia; Seizure  Disorder Oncologic Medical History: Negative for: Received Chemotherapy; Received Radiation Psychiatric Complaints and Symptoms: No Complaints or Symptoms Medical History: Negative for: Anorexia/bulimia; Confinement Anxiety HBO Extended History Items AHMANI, PREHN. ([id number]) Eyes: Cataracts Immunizations Pneumococcal Vaccine: Received Pneumococcal Vaccination: No Implantable Devices Family and Social History Former smoker - 10 years ago; Marital Status - Married; Alcohol Use: Moderate; Drug Use: No History; Caffeine Use: Never; Advanced Directives: No; Patient does not want information on Advanced Directives; Living Will: No; Medical Power of Attorney: No Physician Affirmation I have reviewed and agree with the above information. Electronic Signature(s) Signed: 07/22/2018 5:02:25 PM By: Curtis Sites Signed: 07/23/2018 12:54:11 AM By: Lenda Kelp PA-C Entered By: Lenda Kelp on 07/22/2018 11:18:11 [name] ([id number]) -------------------------------------------------------------------------------- SuperBill Details Patient Name: [name] Date of Service: 07/22/2018 Medical Record Number: [id number] Patient Account Number: [id number] Date of Birth/Sex: 10-13-1945 (72 y.o. 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, sequela T81.31XS Disruption of external operation (surgical) wound, not elsewhere classified, sequela Facility Procedures CPT4 Code: 62952841 Description: 11042 - DEB SUBQ  TISSUE 20 SQ CM/< ICD-10 Diagnosis Description L97.312 Non-pressure chronic ulcer of right ankle with fat layer ex Modifier: posed Quantity: 1 CPT4 Code: 32440102 Description: 11045 - DEB SUBQ TISS EA ADDL 20CM ICD-10 Diagnosis Description L97.312 Non-pressure chronic ulcer of right ankle with fat layer ex Modifier: posed Quantity: 1 Physician Procedures CPT4 Code: 7253664 Description: 11042 - WC PHYS SUBQ TISS 20 SQ CM ICD-10 Diagnosis Description L97.312 Non-pressure chronic ulcer of right ankle with fat layer exp Modifier: osed Quantity: 1 CPT4 Code: 4034742 Description: 11045 - WC PHYS SUBQ TISS EA ADDL 20 CM ICD-10 Diagnosis Description L97.312 Non-pressure chronic ulcer of right ankle with fat layer exp Modifier: osed Quantity: 1 Electronic Signature(s) Signed: 07/23/2018 12:54:11 AM By: Lenda Kelp PA-C Entered By: Lenda Kelp on 07/22/2018 11:19:16

## 2018-08-05 ENCOUNTER — Encounter: Payer: Medicare HMO | Attending: Physician Assistant | Admitting: Physician Assistant

## 2018-08-05 DIAGNOSIS — Z87891 Personal history of nicotine dependence: Secondary | ICD-10-CM | POA: Diagnosis not present

## 2018-08-05 DIAGNOSIS — L97312 Non-pressure chronic ulcer of right ankle with fat layer exposed: Secondary | ICD-10-CM | POA: Insufficient documentation

## 2018-08-05 DIAGNOSIS — J449 Chronic obstructive pulmonary disease, unspecified: Secondary | ICD-10-CM | POA: Diagnosis not present

## 2018-08-05 DIAGNOSIS — Z7901 Long term (current) use of anticoagulants: Secondary | ICD-10-CM | POA: Diagnosis not present

## 2018-08-05 DIAGNOSIS — I87331 Chronic venous hypertension (idiopathic) with ulcer and inflammation of right lower extremity: Secondary | ICD-10-CM | POA: Diagnosis not present

## 2018-08-05 DIAGNOSIS — Z86718 Personal history of other venous thrombosis and embolism: Secondary | ICD-10-CM | POA: Insufficient documentation

## 2018-08-10 NOTE — Progress Notes (Signed)
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 arrives with irritation on the medial aspect of his ankle noticed by her intake nurse. The patient is noted pain in the area over the last day or 2. There are four new tiny wounds in this area. His co-pay for TheraSkin application is really high I think beyond her means 02/07/16; patient is improved C+S cultures MSSA completed Doxy. using iodoflex 02/15/16; patient arrived today with the wound and roughly the same condition. Extensive area on the right lateral foot and ankle. Using Iodoflex. He came in last week with a cluster of new wounds on the medial aspect of the same ankle. 02/22/16; once again the patient complains of a lot of drainage coming out of this wound. We brought  him back in on Friday  for a dressing change has been using Iodoflex. States his pain level is better 02/29/16; still complaining of a lot of drainage even though we are putting absorbent material over the Santyl and bringing him back on Fridays for dressing changes. He is not complaining of pain. Her intake nurse notes blistering 03/07/16: pt returns today for f/u. he admits out in rain on Saturday and soaked his right leg. he did not share with his wife and he didn't notify the [facility]. he has an odor today that is c/w pseudomonas. Wound has greenish tan slough. there is no periwound erythema, induration, or fluctuance. wound has deteriorated since previous visit. denies fever, chills, body aches or malaise. no increased pain. 03/13/16: C+S showed proteus. He has not received AB'S. Switched to RTD last week. 03/27/16 patient is been using Iodoflex. Wound bed has improved and debridement is certainly easier 04/10/2016 -- he has been scheduled for a venous duplex study towards the end of the month 04/17/16; has been using silver alginate, states that the Iodoflex was hurting his wound and since that is been changed he has had no pain unfortunately the surface of the wound continues to be unhealthy with thick gelatinous slough and nonviable tissue. The wound will not heal like this. 04/20/2016 -- the patient was here for a nurse visit but I was asked to see the patient as the slough was quite significant and the nurse needed for clarification regarding the ointment to be used. 04/24/16; the patient's wounds on the right medial and right lateral ankle/malleolus both look a lot better today. Less adherent slough healthier tissue. Dimensions better especially medially 05/01/16; the patient's wound surface continues to improve however he continues to require debridement switch her easier each week. Continue Santyl/Metahydrin mixture Hydrofera Blue next week. Still drainage on the medial aspect according to  the intake nurse 05/08/16; still using Santyl and Medihoney. Still a lot of drainage per her intake nurse. Patient has no complaints pain fever chills etc. 05/15/16 switched the Hydrofera Blue last week. Dimensions down especially in the medial right leg wound. Area on the lateral which is more substantial also looks better still requires debridement 05/22/16; we have been using Hydrofera Blue. 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 Currently he tells me that the pain is doing very well about a 1 out of 10 today. Yesterday was a little bit worse but he tells me that he was more active watering his flowers that day. Overall he feels that his symptoms are doing significantly better at this point in time. His edema continues to be controlled well with the 4-layer compression wrap and he really has not noted any odor at this point in time. He is tolerating the dressing changes when they are performed well. 06/05/16 at this point in time today patient currently shows no interval signs or symptoms of local or systemic infection. Again his pain level he rates to be a 1 out of 10 at most and overall he tells me that generally this is not giving him much trouble. In fact he even feels maybe a little bit better than last week. We have continue with the 4-layer compression wrap in which she tolerates very well at this point. He is continuing to utilize the [place]. 06/12/16 I think there has been some progression in  the status of both of these wounds over today again covered in a gelatinous surface. Has been using Hydrofera Blue. We had used Iodoflex in the past I'm not sure if there was an issue other than changing to something that might progress towards closure faster 06/19/16; he  did not tolerate the Flexeril last week secondary to pain and this was changed on Friday back to [facility] area he continues to have copious amounts of gelatinous surface slough which is think inhibiting the speed of healing this area 06/26/16 patient over the last week has utilized the Santyl to try to loosen up some of the tightly adherent slough that was noted on evaluation last week. The good news is he tells me that the medial malleoli region really does not bother him the right llateral malleoli region is more tender to palpation at this point in time especially in the central/inferior location. However it does appear that the Santyl has done his job to loosen up the adherent slough at this point in time. Fortunately he has no interval signs or symptoms of infection locally or systemically no purulent discharge noted. 07/03/16 at this point in time today patient's wounds appear to be significantly improved over the right medial and lateral malleolus locations. He has much less tenderness at this point in time and the wounds appear clean her although there is still adherent slough this is sufficiently improved over what I saw last week. I still see no evidence of local infection. 07/10/16; continued gradual improvement in the right medial and lateral malleolus locations. The lateral is more substantial wound now divided into 2 by a rim of normal epithelialization. 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 He denies any complaints of pain or intolerance to compression. Both ulcers are smaller in size oriented today's measurements, both are covered with a softly adherent slough. 07/24/16; medial wound is smaller, lateral about the same although surface looks better. Still using Hydrofera Blue 07/31/16; arrives today complaining of pain in the lateral part of his foot.  Nurse reports a lot more drainage. He has been using Hydrofera Blue. Switch to silver alginate today 08/03/2016 -- I was asked to see the patient was here for a nurse visit today. I understand he had a lot of pain in his right lower extremity and was having blisters on his right foot which have not been there before. Though he started on doxycycline he does not have blisters elsewhere on his body. I do not believe this is a drug allergy. also mentioned that there was a copious purulent discharge from the wound and clinically there is no evidence of cellulitis. 08/07/16; I note that the patient came in for his nurse check on Friday apparently with blisters on his toes on the right than a lot of swelling in his forefoot. He continued on the doxycycline that I had prescribed on 12/8. A culture was done of the lateral wound that showed a combination of a few Proteus and Pseudomonas. Doxycycline might of covered the Proteus but would be unlikely to cover the Pseudomonas. He is on Coumadin. He arrives in the clinic today feeling a lot better states the pain is a lot better but nothing specific really was done other than to rewrap the foot also noted that he had arterial studies ordered in August although these were never done. It is reasonable to go ahead and reorder these.  08/14/16; generally arrives in a better state today in terms of the wounds he has taken cefdinir for one week. Our intake nurse reports copious amounts of drainage but the patient is complaining of much less pain. He is not had his PT and INR checked and I've asked him to do this today or tomorrow. 08/24/2016 -- patient arrives today after 10 days and said he had a stomach upset. His arterial study was done and I have reviewed this report and find it to be within normal limits. However I did not note any venous duplex studies for reflux, and Dr. Leanord Hawking may have ordered these in the past but I will leave it to him to decide if he needs  these. The patient has finished his course of cefdinir. 08/28/16; patient arrives today again with copious amounts of thick really green drainage for our intake nurse. He states he has a very tender spot at the superior part of the lateral wound. Wounds are larger 09/04/16; no real change in the condition of this patient's wound still copious amounts of surface slough. Started him on Iodoflex last week he is completing another course of Cefdinir or which I think was done empirically. His arterial study showed ABIs were 1.1 on the right 1.5 on the left. He did have a slightly reduced ABI in the right the left one was not obtained. Had calcification of the right posterior tibial artery. The interpretation was no segmental stenosis. His waveforms were triphasic. AIDRIC, ENDICOTT ([id number]) His reflux studies are later this month. Depending on this I'll send him for a vascular consultation, he may need to see plastic surgery as I believe he is had plastic surgery on this foot in the past. He had an injury to the foot in the 1980s. 1/16 /18 right lateral greater than right medial ankle wounds on the right in the setting of previous skin grafting. 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 He does not have arterial issues. Each week he comes in with the same adherent surface slough although there was less of this today 09/18/16; right lateral greater than right medial lower extremity wounds in the setting of previous skin grafting and trauma. He has least to vein laser ablation scheduled for February 2 for venous reflux. He does not have significant arterial disease. Problem has been very difficult to handle surface slough/necrotic tissue. Recently using Iodoflex for this with some, albeit slow improvement 09/25/16; right lateral greater than right medial lower extremity venous wounds in the setting of previous skin  grafting. He is going for ablation surgery on February 2 after this he'll come back here for rewrap. He has been using Iodoflex as the primary dressing. 10/02/16; right lateral greater than right medial lower extremity wounds in the setting of previous skin grafting. He had his ablation surgery last week, I don't have a report. He tolerated this well. Came in with a thigh-high Unna boots on Friday. We have been using Iodoflex as the primary dressing. His measurements are improving 10/09/16; continues to make nice aggressive in terms of the wounds on his lateral and medial right ankle in the setting of previous skin grafting. Yesterday he noticed drainage at one of his surgical sites from his venous ablation on the right calf. He took off the bandage over this area felt a "popping" sensation and a reddish-brown drainage. He is not complaining of any  pain 10/16/16; he continues to make nice progression in terms of the wounds on the lateral and medial malleolus. Both smaller using Iodoflex. He had a surgical area in his posterior mid calf we have been using iodoform. All the wounds are down and dimensions 10/23/16; the patient arrives today with no complaints. He states the Iodoflex is a bit uncomfortable. He is not systemically unwell. We have been using Iodoflex to the lateral right ankle and the medial and Aquacel Ag to the reflux surgical wound on the posterior right calf. All of these wounds are doing well 10/30/16; patient states he has no pain no systemic symptoms. I changed him to [facility] last week. Although the wounds are doing well 11/06/16; patient reports no pain or systemic symptoms. We continue with Hydrofera Blue. Both wound areas on the medial and lateral ankle appear to be doing well with improvement and dimensions and improvement in the wound bed. 11/13/16; patient's dimensions continued to improve. We continue with Hydrofera Blue on the medial and lateral side. Appear to be doing  well with healthy granulation and advancing epithelialization 11/20/16; patient's dimensions improving laterally by about half a centimeter in length. Otherwise no change on the medial side. Using [facility] 12/04/16; no major change in patient's wound dimensions. Intake nurse reports more drainage. 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 He is voicing no complaints or concerns. He has been tolerating his twice weekly compression therapy changes 12/11/16 Patient complains of pain and increased drainage.. wants hydrofera blue 12/18/16 improvement. Sorbact 12/25/16; medial wound is smaller, lateral measures the same. Still on sorbact 01/01/17; medial wound continues to be smaller, lateral measures about the same however there is clearly advancing epithelialization here as well in fact I think the wound will ultimately divided into 2 open areas 01/08/17; unfortunately today fairly significant regression in several areas. Surface of the lateral wound covered again in adherent necrotic material which is difficult to debridement. He has significant surrounding skin maceration. The expanding area of tissue epithelialization in the middle of the wound that was encouraging last week appears to be smaller. There is no surrounding tenderness. The area on the medial leg also did not seem to be as healthy as last week, the reason for this regression this week is not totally clear. We have been using Sorbact for the last 4 weeks. We'll switched of polymen AG which we will order via home medical supply. If there is a problem with this would switch back to Iodoflex 01/15/18; drainage,odor. No change. Switched to polymen last week 01/22/17; still continuous drainage. Culture I did last week showed a few Proteus pansensitive. I did this culture because of drainage. Put him on Augmentin which she has been taking  since Saturday however he is developed 4-5 liquid bowel movements. He is also on Coumadin. Beyond this wound is not changed at all, still nonviable necrotic surface material which I debrided reveals healthy granulation line 01/29/17; still copious amounts of drainage reported by her intake nurse. Wound measuring slightly smaller. Currently fact on Iodoflex although I'm looking forward to changing back to perhaps [facility] or polymen AG 02/05/17; still large amounts of drainage and presenting with really large amounts of adherent slough and necrotic material over the remaining open area of the wound. We have been using Iodoflex but with little improvement in the surface. Change to [facility] 02/12/17; still large amount of drainage.  Much less adherent slough however. Started [employer]	[place] last week ZAKKARY, THIBAULT ([id number]) 02/19/17; drainage is better this week. Much less adherent slough. Perhaps some improvement in dimensions. Using [facility] 02/26/17; severe venous insufficiency wounds on the right lateral and right medial leg. Drainage is some better and slough is less adherent we've been using Hydrofera Blue 03/05/17 on evaluation today patient appears to be doing well. His wounds have been decreasing in size and overall he is pleased with how this is progressing. We are awaiting approval for the epigraph which has previously been recommended in the meantime the [facility] Dressing is to be doing very well for him. 03/12/17; wound dimensions are smaller still using Hydrofera Blue. Comes in on Fridays for a dressing change. 03/19/17; wound dimensions continued to contract. Healing of this wound is complicated by continuous significant drainage as well as recurrent buildup of necrotic surface material. We looked into Apligraf and he has a $290 co-pay per application but truthfully I think the drainage as well as the nonviable surface would preclude use of Apligraf there are any  other skin substitute at this point therefore the continued plan will be debridement each clinic visit, 2 times a week dressing changes and continued use of Hydrofera Blue. improvement has been very slow but sustained 03/26/17 perhaps slight improvements in peripheral epithelialization is especially inferiorly. Still with large amount of drainage and tightly adherent necrotic surface on arrival. Along with the intake nurse I reviewed previous treatment. He worsened on Iodoflex and had a 4 week trial of sorbact, Polymen AG and a long courses of Aquacel Ag. He is not a candidate for advanced treatment options for many reasons 04/09/17 on evaluation today patient's right lower extremity wounds appear to be doing a little better. Fortunately he has no significant discomfort and has been tolerating the dressing changes including the wraps without complication. With that being said he really does not have swelling anymore compared to what he has had in the past following his vascular intervention. He wonders if potentially we could attempt avoiding the rats to see if he could cleanse the wound in between to try to prevent some of the fiber and its buildup that occurs in the interim between when we see him week to week. No fevers, chills, nausea, or vomiting noted at this time. 04/16/17; noted that the staff made a choice last week not to put him in compression. The patient is changing his dressing at home using [facility] and changing every second day. His wife is washing the wound with saline. He is using kerlix. Surprisingly he has not developed a lot of edema. This choice was made because of the degree of fluid retention and maceration even with changing his dressing twice a week 04/23/17; absolutely no change. Using Hydrofera Blue. Recurrent tightly adherent nonviable surface material. This is been refractory to Iodoflex, sorbact and now Hydrofera Blue. More recently he has been changing his own  dressings at home, cleansing the wound in the shower. He has not developed lower extremity edema 04/30/17; if anything the wound is larger still in adherent surface although it debrided easily today. I've been using Mehta honey for 1 week and I'd like to try and do it for a second week this see if we can get some form of viable surface here. 05/07/17; patient arrives with copious amounts of drainage and some pain in the superior part of the wound. He has not been systemically unwell. He's been changing this daily  at home 05/14/17; patient arrives today complaining of less drainage and less pain. Dimensions slightly better. Surface culture I did of this last week grew MSSA I put him on dicloxacillin for 10 days. Patient requesting a further prescription since he feels so much better. He did not obtain the [facility] for reasons that aren't clear, he has been using Hydrofera Blue 05/21/17; perhaps some less drainage and less pain. He is completing another week of doxycycline. Unfortunately there is no change in the wound measurements are appearance still tightly adherent necrotic surface material that is really defied treatment. We have been using Hydrofera Blue most recently. He did not manage to get Anasept. He was told it was prescription at [facility] 05/29/17; absolutely no improvement in these wound areas. He had remote plastic surgery with who was done at [facility] in [place] surrounding this area. This was a traumatic wound with extensive plastic surgery/skin grafting at that time. I switched him to sorbact last week to see if he can do anything about the recurrent necrotic surface and drainage. This is largely been refractory to Iodoflex/Hydrofera Blue/alginates. I believe we tried Elby [employer]ers and more recently Medi honey l however the drainage is really too excessive 06/04/17; patient went to see Dr. Ulice Bold. Per the patient she ordered him a compression stocking. Continued the Anasept gel  and sorbact was already on. No major improvement in the condition of the wounds in fact the medial wound looks larger. Again per the patient she did not feel a skin graft or operative debridement was indicated The patient had previous venous ablation in February 2018. Last arterial studies were in December 2017 and were within normal limits 06/18/17; patient arrives back in clinic today using Anasept gel with sorbact. He changes this every day is wearing his own compression stocking. The patient actually saw Dr. Leta Baptist of plastic surgery. I've reviewed her note. The appointment was on 05/30/17. The basic issue with here was that she did not feel that skin grafts for patients with underlying stasis and edema have a good long-term success rate. She also discuss skin substitutes which has been done here as well however I have not been able to get the surface of these wounds to something that I think would support skin substitutes. This is why he felt he might need an CARLE, FENECH. ([id number]) operative debridement more aggressively than I can do in this clinic Review of systems; is otherwise negative he feels well 07/02/17; patient has been using Anasept gel with Sorbact. He changes this every day scrubs out the wound beds. Arrives today with the wound bed looking some better. Easier debridement 07/16/17; patient has been using Anasept gel was sorbact for about a month now. The necrotic service on his wound bed is better and the drainage is now down to minimal but we've not made any improvements in the epithelialization. Change to Santyl today. Surface of the wound is still not good enough to support skin substitutes 07/30/17 on evaluation today patient appears to be doing very well in regard to his right bimalleolus wounds. He has been tolerating the treatment with the Anasept gel and sorbact. However we were going to try and switch to Santyl to be more aggressive with these wounds. This was  however cost prohibitive. Therefore we will likely go back to the sorbact. Nonetheless he is not having any significant discomfort he still is forming slough over the wound location but nothing too significant. The wounds do appear to be filling in  nicely. 08/13/17; I have not seen this wound in about a month. There is not much change. The fact the area medially looks larger. He has been using sorbact and Anasept for over a month now without much effect. Arrives in clinic stating that he does not want the area debrided 08/28/17; patient arrives with the areas on his right medial and right lateral ankle worse. The wounds of expanded there is erythema and still drainage. There is no pain and no tenderness. We had been using Keracel AG clearly not doing well. 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 He states that even the evening that we began utilizing the Iodoflex he started having burning. Subsequently this never really improved and he eventually discontinue the use of the Iodoflex altogether. Unfortunately he did not let us know about any of this until today. I'm unsure of any specific infection at this point although I am going to obtain a wound culture today in order to see if there's anything that could potentially be causing an infection as well. It does sound as if he's had the Iodoflex before and did not have this kind of reaction although this does sound very specific for a reaction to the Iodoflex. 09/17/17 on evaluation today patient appears to be doing significantly better compared to last week's evaluation. At that time it actually appears that he did have an infection the good news is that we have been able to get this under  control with switching to the [facility] solution he's not having as much pain and discomfort he still does have some erythema surrounding the medial aspect wound. He has been tolerating the Dakin's soaked gauze dressing which is excellent and that his wounds do not seem to have as much adherent slough. Overall I'm pleased with the progress she's made in last week. 11/05/17 on evaluation today patient appears to be doing better in regard to his right medial and lateral malleolus ulcers. He has been tolerating the dressing changes without complication. I do flight the Freada Bergeron is helping with additional granulation at this point in the wound beds do appear to be a little bit better. With that being said patient does not appear to have any evidence of significant infection at this point there is no erythema as he has previously noted he does note that the 30 day course of antibiotics I placed him on will end tomorrow. 11/12/17 on evaluation today patient actually appears to be doing excellent in regard to his right medial and lateral malleolus ulcers. He has been tolerating the dressing changes without complication. Fortunately he has no evidence of infection at this time and overall I believe he is progressing extremely nicely he has a lot of new epithelialization noted on evaluation today. Patient likewise is very pleased with the progress even just since last week. 11/19/17 on evaluation today patient appears to be doing about the same in regard to his ulcerations. He does not have any additional breakdown although he really has not noted any significant improvement either over the past week. With that being said the more concerning thing at this time is that he is beginning to show signs of erythema surrounding both wounds which is what typically happens and  then he will develop into a more overt infection. This has been the course for some time. I hope that doing the 30 day in a biotic cycle this past time  would break that so that he would not have the same type of thing happen again. Nonetheless it appears that is digressing back into this infected state unfortunately. With that being said he is not having any pain currently which is good he typically does start to hurt more as this progresses. Fortunately there does not appear to be any evidence of infection systemically which is good news. 11/26/17 on evaluation today patient appears to actually be doing rather well in regard to his ulcer compared to last week. He still has your theme and noted around both the medial and lateral malleolus ulcers of the right lower extremity. He does not seem to be quite as overtly infected however. He has been tolerating the dressing changes without complication which is good news. The gentamicin cream does seem to have been of benefit for him. With that being said he does actually have an appointment with Dr. Sampson Goon this morning to see if there's anything that would be recommended in the way of IV antibiotic Emert, Dashiell C. ([id number]) therapy. Otherwise he still has slough noted on the surface of the wound. 12/03/17 on evaluation today patient presents for follow-up concerning his ongoing right lower extremity ulcers. He has been tolerating the dressing changes without complication he states he has not really been using gentamicin on the lateral ankle and this seems to be doing very well. For that reason I think that is probably fine to put a hold on the gentamicin he states his burns and stings when he applies it and we maybe be able to just use this intermittently when things become more irritated. Other than that the good news is I did review his MRI which was performed on 11/28/17 and revealed that he has a negative MRI for abscess, osteomyelitis, or septic joint. Obviously these were the issues that I was concerned with the possibility of and I'm pleased to find that there is no problems in that regard. I  did also review Dr. Jarrett Ables note from infectious disease he discussed performed some lab work which apparently was done and then subsequently was going to consider the possibility of Keflex long-term for prevention of infection although this has not been prescribed as of yet. 12/09/17 on evaluation today patient appears to be doing a little bit worse in regard to his right medial and lateral malleolus ulcers. Fortunately the wound does not seem to be significantly worse although he does have a lot more drainage especially the lateral aspect he is having a lot more pain than what he has noted more recently. Fortunately there does not appear to be any evidence of systemic infection which is good news. Again he has seen Dr. Sampson Goon but he has not been placed on the potential Keflex which was recommended as a possibility at least yet. 12/17/17 unfortunately on evaluation today the patient's wound appears to be doing much worse. He has been performing the dressing changes as directed. Upon a review of notes in epic it does appear that Dr. Jarrett Ables office specifically sending Keflex for the patient on the 16th which was last Tuesday. With that being said the patient states that though this was sent into [facility] that [facility] stated they never received it and subsequently the patient never took anything. He tells me he has been in bed since  Friday due to the increased pain and overall general malaise. No fevers, chills, nausea, or vomiting noted at this time. 02/25/18 on evaluation today patient appears to be doing rather well in regard to his bilateral right malleolus ulcers. He has been tolerating the dressing changes without complication. Currently there does not appear to be any evidence of infection. Overall I'm very pleased with the progress that seems to been made up to this point. 03/11/18 on evaluation today patient actually appears to be doing very well in regard to his right ankle ulcers.  He's been tolerating the dressing changes without complication. Fortunately there does not appear to be any evidence of sniffing infection I do believe that the gentamicin cream continues to be of benefit for him. Fortunately he is showing signs of healing in which time I see him. He also tells me he's having no pain. 03/25/18 on evaluation today patient actually appears to show signs of improvement especially in regard to the right lateral ankle ulcer. He has been tolerating the dressing changes without complication. In general I'm very pleased with the progress that has been made up to this point. 04/08/18- He presents in follow up evaluation for right bimalleolar wounds; there is essentially no change. He admits to removing the dressing at night to allow the wounds to be "air out". We will continue with same treatment plan and he will follow up in two weeks 04/22/18 on evaluation today patient seems very frustrated with this situation currently as far as the ulcer on his right medial and lateral malleolus is concerned. He states this has been going on a very long time and obviously he does have a lot of bills as well is far as wound care is concerned. With that being said he tells me that he's been tolerating the dressing changes well although he did clarify that what he is doing is putting the medicine on in the morning after shower and then subsequently he takes the dressing off at night leaving it open to air. I previously asked him about this and I was not aware that he was doing this. Nonetheless that may be slowing things down a bit at this point. Fortunately there does not appear to be any evidence of severe infection although he does have some evidence of your theme is surrounding the wound bed. He is still using the gentamicin cream. 05/06/18 on evaluation today patient actually presents for evaluation of his right medial malleolus ulcers. He's been tolerating the dressing changes without  complication. With that being said it does appear that he is doing better compared to last evaluation. He's not having the pain like he did previous. He also states that blisters that he had coming up when I saw him last did resolve and ended up doing okay he did go to the ER they gave him some cream to put on it he's not sure what the name of the cream was. He was down in [facility] when he called me and I spoke with him previous. Nonetheless he states that they told him to put the medicine on his our due list. With that being said I'm not really 100% sure that the medicine had anything to do with this due to the fact that again he was having the blisters when he came into the office which is why we actually put them on the anabiotic I was concerned about him having an infection which was causing the blistering and increased pain in his extremity.  Nonetheless this is something that we can definitely be cautious of in the future it was Bactrim Yang, Liem C. ([id number]) that we had him on. Overall I'm glad he is doing better. 05/20/18 on evaluation today patient actually appears to be doing fairly well in regard to his ulcer on the right lateral and medial malleolus locations. Both seem to be doing decently well he does have slough building up on the surface of the wound. He has been using the Hibiclense as I instructed him. Overall there's no evidence of significant infection which is good news. 06/02/18 on evaluation today patient actually appears to be doing excellent in regard to his right medial and lateral malleolus ulcers. He has been tolerating the dressing changes without complication. There does not appear to be any evidence of infection at this time which is great news. In general I feel like he has made great progress in the past several weeks. He has good granulation noted over areas that have not had good granulation previously and epithelialization even more so in several areas.  Both wounds measured smaller. 06/17/18 an evaluation today patient actually appears to be doing excellent in regard to his right lateral and medial malleolus ulcers. He has been tolerating the dressing changes without complication fortunately the Hydrofera Blue Dressing to be doing excellent for him. He's actually making progress each time I see him. 07/08/18 on evaluation today patient's wounds on the right medial and lateral malleolus or region show signs of improvement currently. He has been tolerating the dressing changes without complication at this time. He does feel like the [facility] Dressing has been of benefit for him. No fevers, chills, nausea, or vomiting noted at this time. 07/22/18 and evaluation today patient actually appears to be doing excellent in regard to his right medial and lateral malleolus ulcers. He's been tolerating the dressing changes without complication the Hydrofera Blue Dressing years be excellent for him. Overall I'm extremely pleased in this regard. 08/05/18 on evaluation today patient's right lateral and medial malleolus ulcers appear to be doing well at this point. There does not appear to be any evidence of infection which is good news. Overall he does have some Slough noted which is going to require sharp debridement but other than this I see no current issues Patient History Information obtained from Patient. Social History Former smoker - 10 years ago, Marital Status - Married, Alcohol Use - Moderate, Drug Use - No History, Caffeine Use - Never. Medical And Surgical History Notes Constitutional Symptoms (General Health) Vein Filter (groin area); CODA; H/O Blood Clots; pulmonary hypertensive arterial disease Review of Systems (ROS) Constitutional Symptoms (General Health) Denies complaints or symptoms of Fatigue, Fever, Chills. Respiratory The patient has no complaints or symptoms. Cardiovascular The patient has no complaints or  symptoms. Psychiatric The patient has no complaints or symptoms. Objective NANDAN, WILLEMS ([id number]) Constitutional Well-nourished and well-hydrated in no acute distress. Vitals Time Taken: 8:11 AM, Height: 76 in, Weight: 238 lbs, BMI: 29, Temperature: 98.1 F, Pulse: 82 bpm, Respiratory Rate: 16 breaths/min, Blood Pressure: 42/63 mmHg. Respiratory normal breathing without difficulty. Cardiovascular no clubbing, cyanosis, significant edema, Psychiatric this patient is able to make decisions and demonstrates good insight into disease process. Alert and Oriented x 3. pleasant and cooperative. General Notes: Patient's wound bed currently did require sharp debridement at both sides which was performed today without complication and post debridement the wound beds appeared better. Overall I do think he continues to make good progress. Integumentary (Hair,  Skin) Wound #1 status is Open. Original cause of wound was Gradually Appeared. The wound is located on the Right,Lateral Malleolus. The wound measures 3.8cm length x 4cm width x 0.2cm depth; 11.938cm^2 area and 2.388cm^3 volume. Wound #2 status is Open. Original cause of wound was Gradually Appeared. The wound is located on the Right,Medial Malleolus. The wound measures 3cm length x 2.2cm width x 0.2cm depth; 5.184cm^2 area and 1.037cm^3 volume. 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 There was a Excisional Skin/Subcutaneous Tissue  Debridement with a total area of 15.2 sq cm performed by STONE III, Scottie Metayer E., PA-C. With the following instrument(s): Curette to remove Viable and Non-Viable tissue/material. Material removed includes Subcutaneous Tissue and Slough and after achieving pain control using Lidocaine 4% Topical Solution. No specimens were taken. A time out was conducted at 08:30, prior to the start of the procedure. A Minimum amount of bleeding was controlled with Pressure. The procedure was tolerated well with a pain level of 0 throughout and a pain level of 0 following the procedure. Post Debridement Measurements: 3.8cm length x 4cm width x 0.3cm depth; 3.581cm^3 volume. Character of Wound/Ulcer Post Debridement is improved. Severity of Tissue Post Debridement is: Fat layer exposed. MARQUETTE, BLODGETT ([id number]) Post procedure Diagnosis Wound #1: Same as Pre-Procedure Wound #2 Pre-procedure diagnosis of Wound #2 is a Venous Leg Ulcer located on the Right,Medial Malleolus .Severity of Tissue Pre Debridement is: Fat layer exposed. There was a Excisional Skin/Subcutaneous Tissue Debridement with a total area of 6.6 sq cm performed by STONE III, Josecarlos Harriott E., PA-C. With the following instrument(s): Curette to remove Viable and Non-Viable tissue/material. Material removed includes Subcutaneous Tissue and Slough and after achieving pain control using Lidocaine 4% Topical Solution. No specimens were taken. A time out was conducted at 08:28, prior to the start of the procedure. A Minimum amount of bleeding was controlled with Pressure. The procedure was tolerated well with a pain level of 0 throughout and a pain level of 0 following the procedure. Post Debridement Measurements: 3cm length x 2.2cm width x 0.3cm depth; 1.555cm^3 volume. Character of Wound/Ulcer Post Debridement is improved. Severity of Tissue Post Debridement is: Fat layer exposed. Post procedure Diagnosis Wound #2: Same as Pre-Procedure Plan Wound  Cleansing: Wound #1 Right,Lateral Malleolus: Clean wound with Normal Saline. May Shower, gently pat wound dry prior to applying new dressing. - wash with hibiclens daily and leave on 5 mins Wound #2 Right,Medial Malleolus: Clean wound with Normal Saline. May Shower, gently pat wound dry prior to applying new dressing. - wash with hibiclens and leave on 5 mins Anesthetic (add to Medication List): Wound #1 Right,Lateral Malleolus: Topical Lidocaine 4% cream applied to wound bed prior to debridement (In Clinic Only). Wound #2 Right,Medial Malleolus: Topical Lidocaine 4% cream applied to wound bed prior to debridement (In Clinic Only). Primary Wound Dressing: Wound #1 Right,Lateral Malleolus: Hydrafera Blue Ready Transfer Wound #2 Right,Medial Malleolus: Hydrafera Blue Ready Transfer Secondary Dressing: Wound #1 Right,Lateral Malleolus: Dry Gauze Conform/Kerlix Wound #2 Right,Medial Malleolus: Dry Gauze Conform/Kerlix Dressing Change Frequency: Wound #1 Right,Lateral Malleolus: Change dressing every other day. Wound #2  Right,Medial Malleolus: Change dressing every other day. Follow-up Appointments: Wound #1 Right,Lateral Malleolus: Return Appointment in 2 weeks. Wound #2 Right,Medial Malleolus: Return Appointment in 2 weeks. Edema Control: Wound #1 Right,Lateral Malleolus: Elevate legs to the level of the heart and pump ankles as often as possible Wieand, Tajah C. ([id number]) Wound #2 Right,Medial Malleolus: Elevate legs to the level of the heart and pump ankles as often as possible Additional Orders / Instructions: Wound #1 Right,Lateral Malleolus: Increase protein intake. Wound #2 Right,Medial Malleolus: Increase protein intake. I'm in a recommend currently that we go ahead and continue with the above wound care measures since the patient seems to be doing so well. He is in agreement with plan. If anything changes or worsens meantime he will contact the office and let  me know. Please see above for specific wound care orders. We will see patient for re-evaluation in 2 week(s) here in the clinic. If anything worsens or changes patient will contact our office for additional recommendations. Electronic Signature(s) Signed: 08/08/2018 8:01:46 PM By: Lenda Kelp PA-C Entered By: Lenda Kelp on 08/05/2018 08:58:57 [name] ([id number]) -------------------------------------------------------------------------------- ROS/PFSH Details Patient Name: [name] Date of Service: 08/05/2018 8:00 AM Medical Record Number: [id number] Patient Account Number: [id number] Date of Birth/Sex: [birth year] (72 y.o. M) Treating RN: Curtis Sites Primary Care Provider: [name] Other Clinician: Referring Provider: [name] Treating Provider/Extender: STONE III, Javonda Suh Weeks in Treatment: 133 Information Obtained From Patient Wound History Do you currently have one or more open woundso Yes How many open wounds do you currently haveo 1 Approximately how long have you had your woundso 5 months How have you been treating your wound(s) until nowo saline, dressing Has your wound(s) ever healed and then re-openedo Yes Have you had any lab work done in the past montho No Have you tested positive for an antibiotic resistant organism (MRSA, VRE)o No Have you tested positive for osteomyelitis (bone infection)o No Have you had any tests for circulation on your legso No Constitutional Symptoms (General Health) Complaints and Symptoms: Negative for: Fatigue; Fever; Chills Medical History: Past Medical History Notes: Vein Filter (groin area); CODA; H/O Blood Clots; pulmonary hypertensive arterial disease Eyes Medical History: Positive for: Cataracts - removed Negative for: Glaucoma; Optic Neuritis Ear/Nose/Mouth/Throat Medical History: Negative for: Chronic sinus problems/congestion Hematologic/Lymphatic Medical History: Negative for: Anemia;  Hemophilia; Human Immunodeficiency Virus; Lymphedema; Sickle Cell Disease Respiratory Complaints and Symptoms: No Complaints or Symptoms Medical History: Positive for: Chronic Obstructive Pulmonary Disease (COPD) Negative for: Aspiration; Asthma; Pneumothorax; Sleep Apnea; Tuberculosis Cardiovascular TARAN, HAYNESWORTH. ([id number]) Complaints and Symptoms: No Complaints or Symptoms Medical History: Negative for: Angina; Arrhythmia; Congestive Heart Failure; Coronary Artery Disease; Deep Vein Thrombosis; Hypertension; Hypotension; Myocardial Infarction; Peripheral Arterial Disease; Peripheral Venous Disease; Phlebitis; Vasculitis Gastrointestinal Medical History: Negative for: Cirrhosis ; Colitis; Crohnos; Hepatitis A; Hepatitis B; Hepatitis C Endocrine Medical History: Negative for: Type I Diabetes; Type II Diabetes Genitourinary Medical History: Negative for: End Stage Renal Disease Immunological Medical History: Negative for: Lupus Erythematosus; Raynaudos Integumentary (Skin) Medical History: Negative for: History of Burn; History of pressure wounds Musculoskeletal Medical History: Positive for: Osteoarthritis Negative for: Gout; Rheumatoid Arthritis; Osteomyelitis Neurologic Medical History: Negative for: Dementia; Neuropathy; Quadriplegia; Paraplegia; Seizure Disorder Oncologic Medical History: Negative for: Received Chemotherapy; Received Radiation Psychiatric Complaints and Symptoms: No Complaints or Symptoms Medical History: Negative for: Anorexia/bulimia; Confinement Anxiety HBO Extended History Items Canedo, Herny C. ([id number]) Eyes: Cataracts Immunizations Pneumococcal Vaccine: Received Pneumococcal Vaccination: No Implantable Devices Family and  Social History Former smoker - 10 years ago; Marital Status - Married; Alcohol Use: Moderate; Drug Use: No History; Caffeine Use: Never; Advanced Directives: No; Patient does not want information on  Advanced Directives; Living Will: No; Medical Power of Attorney: No Physician Affirmation I have reviewed and agree with the above information. Electronic Signature(s) Signed: 08/05/2018 5:28:58 PM By: Curtis Sites Signed: 08/08/2018 8:01:46 PM By: Lenda Kelp PA-C Entered By: Lenda Kelp on 08/05/2018 08:58:21 [name] ([id number]) -------------------------------------------------------------------------------- SuperBill Details Patient Name: [name] Date of Service: 08/05/2018 Medical Record Number: [id number] Patient Account Number: [id number] Date of Birth/Sex: 29-Nov-1945 (72 y.o. 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, sequela T81.31XS Disruption of external operation (surgical) wound, not elsewhere classified, sequela Facility Procedures CPT4 Code: 95284132 Description: 11042 - DEB SUBQ TISSUE 20 SQ CM/< ICD-10 Diagnosis Description L97.312 Non-pressure chronic ulcer of right ankle with fat layer ex Modifier: posed Quantity: 1 CPT4 Code: 44010272 Description: 11045 - DEB SUBQ TISS EA ADDL 20CM ICD-10 Diagnosis Description L97.312 Non-pressure chronic ulcer of right ankle with fat layer ex Modifier: posed Quantity: 1 Physician Procedures CPT4 Code: 5366440 Description: 11042 - WC PHYS SUBQ TISS 20 SQ CM ICD-10 Diagnosis Description L97.312 Non-pressure chronic ulcer of right ankle with fat layer exp Modifier: osed Quantity: 1 CPT4 Code: 3474259 Description: 11045 - WC PHYS SUBQ TISS EA ADDL  20 CM ICD-10 Diagnosis Description L97.312 Non-pressure chronic ulcer of right ankle with fat layer exp Modifier: osed Quantity: 1 Electronic Signature(s) Signed: 08/08/2018 8:01:46 PM By: Lenda Kelp PA-C Entered By: Lenda Kelp on 08/05/2018 08:59:13

## 2018-08-10 NOTE — Progress Notes (Signed)
DOMENICO, ACHORD ([id number]) Visit Report for 08/05/2018 Arrival Information Details Patient Name: [name], [name]. Date of Service: 08/05/2018 8:00 AM Medical Record Number: [id number] Patient Account Number: [id number] Date of Birth/Sex: 09-22-1945 (72 y.o. M) Treating RN: Curtis Sites Primary Care Karine Garn: [name] Other Clinician: Referring Sherell Christoffel: [name] Treating Alysen Smylie/Extender: Linwood Dibbles, HOYT Weeks in Treatment: 133 Visit Information History Since Last Visit Added or deleted any medications: No Patient Arrived: Ambulatory Any new allergies or adverse reactions: No Arrival Time: 08:11 Had a fall or experienced change in No Accompanied By: self activities of daily living that may affect Transfer Assistance: None risk of falls: Patient Identification Verified: Yes Signs or symptoms of abuse/neglect since last visito No Secondary Verification Process Yes Hospitalized since last visit: No Completed: Implantable device outside of the clinic excluding No Patient Requires Transmission-Based No cellular tissue based products placed in the center Precautions: since last visit: Patient Has Alerts: Yes Has Dressing in Place as Prescribed: Yes Patient Alerts: Patient on Blood Pain Present Now: No Thinner Electronic Signature(s) Signed: 08/05/2018 1:28:07 PM By: Dayton Martes RCP, RRT, CHT Entered By: Dayton Martes on 08/05/2018 08:11:41 [name] ([id number]) -------------------------------------------------------------------------------- Encounter Discharge Information Details Patient Name: [name] Date of Service: 08/05/2018 8:00 AM Medical Record Number: [id number] Patient Account Number: [id number] Date of Birth/Sex: 17-Feb-1946 (72 y.o. M) Treating RN: Curtis Sites Primary Care Aina Rossbach: [name] Other Clinician: Referring Raeann Offner: [name] Treating Adeel Guiffre/Extender: Linwood Dibbles,  HOYT Weeks in Treatment: 133 Encounter Discharge Information Items Post Procedure Vitals Discharge Condition: Stable Temperature (F): 98.1 Ambulatory Status: Ambulatory Pulse (bpm): 82 Discharge Destination: Home Respiratory Rate (breaths/min): 16 Transportation: Private Auto Blood Pressure (mmHg): 142/63 Accompanied By: self Schedule Follow-up Appointment: Yes Clinical Summary of Care: Electronic Signature(s) Signed: 08/05/2018 5:28:58 PM By: Curtis Sites Entered By: Curtis Sites on 08/05/2018 08:35:43 [name] ([id number]) -------------------------------------------------------------------------------- Lower Extremity Assessment Details Patient Name: [name] Date of Service: 08/05/2018 8:00 AM Medical Record Number: [id number] Patient Account Number: [id number] Date of Birth/Sex: Jan 09, 1946 (72 y.o. M) Treating RN: Huel Coventry Primary Care Plez Belton: [name] Other Clinician: Referring Omolola Mittman: [name] Treating Stephano Arrants/Extender: Linwood Dibbles, HOYT Weeks in Treatment: 133 Edema Assessment Assessed: [Left: No] [Right: No] [Left: Edema] [Right: :] Calf Left: Right: Point of Measurement: 40 cm From Medial Instep cm 35 cm Ankle Left: Right: Point of Measurement: 14 cm From Medial Instep cm 22 cm Vascular Assessment Pulses: Dorsalis Pedis Palpable: [Right:Yes] Posterior Tibial Extremity colors, hair growth, and conditions: Extremity Color: [Right:Hyperpigmented] Hair Growth on Extremity: [Right:No] Temperature of Extremity: [Right:Warm] Capillary Refill: [Right:< 3 seconds] Toe Nail Assessment Left: Right: Thick: Yes Discolored: Yes Deformed: No Improper Length and Hygiene: No Electronic Signature(s) Signed: 08/06/2018 5:09:03 PM By: Elliot Gurney, BSN, RN, CWS, Kim RN, BSN Entered By: Elliot Gurney, BSN, RN, CWS, Kim on 08/05/2018 08:19:54 [name]  ([id number]) -------------------------------------------------------------------------------- Multi Wound Chart Details Patient Name: [name] Date of Service: 08/05/2018 8:00 AM Medical Record Number: [id number] Patient Account Number: [id number] Date of Birth/Sex: 1946-01-05 (72 y.o. M) Treating RN: Curtis Sites Primary Care Dimonique Bourdeau: [name] Other Clinician: Referring Tayson Schnelle: [name] Treating Maritza Goldsborough/Extender: STONE III, HOYT Weeks in Treatment: 133 Vital Signs Height(in): 76 Pulse(bpm): 82 Weight(lbs): 238 Blood Pressure(mmHg): 42/63 Body Mass Index(BMI): 29 Temperature(F): 98.1 Respiratory Rate 16 (breaths/min): Photos: [1:No Photos] [2:No Photos] [N/A:N/A] Wound Location: [1:Right, Lateral Malleolus] [2:Right, Medial Malleolus] [N/A:N/A] Wounding Event: [1:Gradually Appeared] [2:Gradually Appeared] [N/A:N/A] Primary Etiology: [1:Venous Leg Ulcer] [2:Venous Leg Ulcer] [  N/A:N/A] Date Acquired: [1:08/11/2015] [2:01/30/2016] [N/A:N/A] Weeks of Treatment: [1:133] [2:131] [N/A:N/A] Wound Status: [1:Open] [2:Open] [N/A:N/A] Clustered Wound: [1:No] [2:Yes] [N/A:N/A] Measurements L x W x D [1:3.8x4x0.2] [2:3x2.2x0.2] [N/A:N/A] (cm) Area (cm) : [1:11.938] [2:5.184] [N/A:N/A] Volume (cm) : [1:2.388] [2:1.037] [N/A:N/A] % Reduction in Area: [1:30.90%] [2:55.10%] [N/A:N/A] % Reduction in Volume: [1:53.90%] [2:10.20%] [N/A:N/A] Classification: [1:Full Thickness Without Exposed Support Structures] [2:Full Thickness Without Exposed Support Structures] [N/A:N/A] Periwound Skin Texture: [1:No Abnormalities Noted] [2:No Abnormalities Noted] [N/A:N/A] Periwound Skin Moisture: [1:No Abnormalities Noted] [2:No Abnormalities Noted] [N/A:N/A] Periwound Skin Color: [1:No Abnormalities Noted No] [2:No Abnormalities Noted No] [N/A:N/A N/A] Treatment Notes Electronic Signature(s) Signed: 08/05/2018 5:28:58 PM By: Curtis Sitesorthy, Joanna Entered By: Curtis Sitesorthy, Joanna on 08/05/2018  08:27:06 [name] StackBLACKWELL, [name] ([id number]) -------------------------------------------------------------------------------- Multi-Disciplinary Care Plan Details Patient Name: [name] StackBLACKWELL, [name] Date of Service: 08/05/2018 8:00 AM Medical Record Number: [id number] Patient Account Number: [id number] Date of Birth/Sex: [birth year](72 y.o. M) Treating RN: Curtis Sitesorthy, Joanna Primary Care Rayen Dafoe: Darreld McleanMILES, LINDA Other Clinician: Referring Teyton Pattillo: Darreld McleanMILES, LINDA Treating Ellakate Gonsalves/Extender: STONE III, HOYT Weeks in Treatment: 133 Active Inactive Venous Leg Ulcer Nursing Diagnoses: Knowledge deficit related to disease process and management Goals: Patient will maintain optimal edema control Date Initiated: 04/03/2016 Target Resolution Date: 11/24/2016 Goal Status: Active Interventions: Compression as ordered Treatment Activities: Therapeutic compression applied : 04/03/2016 Notes: Wound/Skin Impairment Nursing Diagnoses: Impaired tissue integrity Goals: Ulcer/skin breakdown will heal within 14 weeks Date Initiated: 01/17/2016 Target Resolution Date: 11/24/2016 Goal Status: Active Interventions: Assess ulceration(s) every visit Notes: Electronic Signature(s) Signed: 08/05/2018 5:28:58 PM By: Curtis Sitesorthy, Joanna Entered By: Curtis Sitesorthy, Joanna on 08/05/2018 08:26:46 [name] StackBLACKWELL, [name] ([id number]) -------------------------------------------------------------------------------- Pain Assessment Details Patient Name: [name] StackBLACKWELL, [name] Date of Service: 08/05/2018 8:00 AM Medical Record Number: [id number] Patient Account Number: [id number] Date of Birth/Sex: [birth year](72 y.o. M) Treating RN: Curtis Sitesorthy, Joanna Primary Care Sahithi Ordoyne: Darreld McleanMILES, LINDA Other Clinician: Referring Chevelle Coulson: Darreld McleanMILES, LINDA Treating Delayla Hoffmaster/Extender: STONE III, HOYT Weeks in Treatment: 133 Active Problems Location of Pain Severity and Description of Pain Patient Has Paino No Site Locations Pain Management and  Medication Current Pain Management: Electronic Signature(s) Signed: 08/05/2018 1:28:07 PM By: Dayton MartesWallace, RCP,RRT,CHT, Sallie RCP, RRT, CHT Signed: 08/05/2018 5:28:58 PM By: Curtis Sitesorthy, Joanna Entered By: Dayton MartesWallace, RCP,RRT,CHT, Sallie on 08/05/2018 08:11:50 [name] StackBLACKWELL, Azell C. ([id number]) -------------------------------------------------------------------------------- Patient/Caregiver Education Details Patient Name: [name] StackBLACKWELL, [name] Date of Service: 08/05/2018 8:00 AM Medical Record Number: [id number] Patient Account Number: [id number] Date of Birth/Gender: [birth year](72 y.o. M) Treating RN: Curtis Sitesorthy, Joanna Primary Care Physician: Darreld McleanMILES, LINDA Other Clinician: Referring Physician: Darreld McleanMILES, LINDA Treating Physician/Extender: Skeet SimmerSTONE III, HOYT Weeks in Treatment: 133 Education Assessment Education Provided To: Patient Education Topics Provided Wound/Skin Impairment: Handouts: Other: wound care as ordered Methods: Demonstration, Explain/Verbal Responses: State content correctly Electronic Signature(s) Signed: 08/05/2018 5:28:58 PM By: Curtis Sitesorthy, Joanna Entered By: Curtis Sitesorthy, Joanna on 08/05/2018 42:59:5608:27:28 [name] StackBLACKWELL, [name] ([id number]) -------------------------------------------------------------------------------- Wound Assessment Details Patient Name: [name] StackBLACKWELL, [name] Date of Service: 08/05/2018 8:00 AM Medical Record Number: [id number] Patient Account Number: [id number] Date of Birth/Sex: [birth year](72 y.o. M) Treating RN: Huel CoventryWoody, Kim Primary Care Kirin Pastorino: Darreld McleanMILES, LINDA Other Clinician: Referring Brunilda Eble: Darreld McleanMILES, LINDA Treating Alinda Egolf/Extender: STONE III, HOYT Weeks in Treatment: 133 Wound Status Wound Number: 1 Primary Etiology: Venous Leg Ulcer Wound Location: Right, Lateral Malleolus Wound Status: Open Wounding Event: Gradually Appeared Date Acquired: 08/11/2015 Weeks Of Treatment: 133 Clustered Wound: No Photos Photo Uploaded By: Elliot GurneyWoody, BSN, RN, CWS, Kim on 08/05/2018  09:08:03 Wound Measurements Length: (cm) 3.8 Width: (cm) 4 Depth: (cm) 0.2 Area: (cm) 11.938 Volume: (  cm) 2.388 % Reduction in Area: 30.9% % Reduction in Volume: 53.9% Wound Description Full Thickness Without Exposed Support Classification: Structures Periwound Skin Texture Texture Color No Abnormalities Noted: No No Abnormalities Noted: No Moisture No Abnormalities Noted: No Treatment Notes Wound #1 (Right, Lateral Malleolus) Notes hydrafera blue, abd, conform, tape Electronic Signature(s) [name], [name] ([id number]) Signed: 08/06/2018 5:09:03 PM By: Elliot Gurney, BSN, RN, CWS, Kim RN, BSN Entered By: Elliot Gurney, BSN, RN, CWS, Kim on 08/05/2018 08:18:33 [name] ([id number]) -------------------------------------------------------------------------------- Wound Assessment Details Patient Name: [name] Date of Service: 08/05/2018 8:00 AM Medical Record Number: [id number] Patient Account Number: [id number] Date of Birth/Sex: [birth year] (72 y.o. M) Treating RN: Huel Coventry Primary Care Janalee Grobe: [name] Other Clinician: Referring Myleka Moncure: [name] Treating Kimorah Ridolfi/Extender: STONE III, HOYT Weeks in Treatment: 133 Wound Status Wound Number: 2 Primary Etiology: Venous Leg Ulcer Wound Location: Right, Medial Malleolus Wound Status: Open Wounding Event: Gradually Appeared Date Acquired: 01/30/2016 Weeks Of Treatment: 131 Clustered Wound: Yes Photos Photo Uploaded By: Elliot Gurney, BSN, RN, CWS, Kim on 08/05/2018 09:08:04 Wound Measurements Length: (cm) 3 Width: (cm) 2.2 Depth: (cm) 0.2 Area: (cm) 5.184 Volume: (cm) 1.037 % Reduction in Area: 55.1% % Reduction in Volume: 10.2% Wound Description Full Thickness Without Exposed Support Classification: Structures Periwound Skin Texture Texture Color No Abnormalities Noted: No No Abnormalities Noted: No Moisture No Abnormalities Noted: No Treatment Notes Wound #2 (Right, Medial  Malleolus) Notes hydrafera blue, abd, conform, tape Electronic Signature(s) [name], [name] ([id number]) Signed: 08/06/2018 5:09:03 PM By: Elliot Gurney, BSN, RN, CWS, Kim RN, BSN Entered By: Elliot Gurney, BSN, RN, CWS, Kim on 08/05/2018 08:18:34 [name] ([id number]) -------------------------------------------------------------------------------- Vitals Details Patient Name: [name] Date of Service: 08/05/2018 8:00 AM Medical Record Number: [id number] Patient Account Number: [id number] Date of Birth/Sex: [birth year] (72 y.o. M) Treating RN: Curtis Sites Primary Care Franshesca Chipman: [name] Other Clinician: Referring Irene Mitcham: [name] Treating Shaquandra Galano/Extender: STONE III, HOYT Weeks in Treatment: 133 Vital Signs Time Taken: 08:11 Temperature (F): 98.1 Height (in): 76 Pulse (bpm): 82 Weight (lbs): 238 Respiratory Rate (breaths/min): 16 Body Mass Index (BMI): 29 Blood Pressure (mmHg): 42/63 Reference Range: 80 - 120 mg / dl Electronic Signature(s) Signed: 08/05/2018 1:28:07 PM By: Dayton Martes RCP, RRT, CHT Entered By: Dayton Martes on 08/05/2018 08:14:18

## 2018-08-19 ENCOUNTER — Encounter: Payer: Medicare HMO | Admitting: Physician Assistant

## 2018-08-19 DIAGNOSIS — I87331 Chronic venous hypertension (idiopathic) with ulcer and inflammation of right lower extremity: Secondary | ICD-10-CM | POA: Diagnosis not present

## 2018-08-20 NOTE — Progress Notes (Signed)
[name], [name] ([id number]) Visit Report for 08/19/2018 Arrival Information Details Patient Name: [name], [name] Date of Service: 08/19/2018 8:00 AM Medical Record Number: [id number] Patient Account Number: [id number] Date of Birth/Sex: Feb 02, 1946 [age] y.o. M) Treating RN: Huel CoventryWoody, Kim Primary Care Aldea Avis: Darreld McleanMILES, LINDA Other Clinician: Referring Annalei Friesz: Darreld McleanMILES, LINDA Treating Patriciaann Rabanal/Extender: Linwood DibblesSTONE III, HOYT Weeks in Treatment: 135 Visit Information History Since Last Visit Added or deleted any medications: No Patient Arrived: Ambulatory Any new allergies or adverse reactions: No Arrival Time: 08:04 Had a fall or experienced change in No Accompanied By: self activities of daily living that may affect Transfer Assistance: None risk of falls: Patient Identification Verified: Yes Signs or symptoms of abuse/neglect since last visito No Secondary Verification Process Yes Hospitalized since last visit: No Completed: Implantable device outside of the clinic excluding No Patient Requires Transmission-Based No cellular tissue based products placed in the center Precautions: since last visit: Patient Has Alerts: Yes Has Dressing in Place as Prescribed: Yes Patient Alerts: Patient on Blood Pain Present Now: No Thinner Electronic Signature(s) Signed: 08/19/2018 1:27:23 PM By: Elliot GurneyWoody, BSN, RN, CWS, Kim RN, BSN Entered By: Elliot GurneyWoody, BSN, RN, CWS, Kim on 08/19/2018 08:05:24 [name], [name] ([id number]) -------------------------------------------------------------------------------- Encounter Discharge Information Details Patient Name: [name], [name] Date of Service: 08/19/2018 8:00 AM Medical Record Number: [id number] Patient Account Number: [id number] Date of Birth/Sex: Feb 02, 1946 [age] y.o. M) Treating RN: Curtis Sitesorthy, Joanna Primary Care Tanessa Tidd: Darreld McleanMILES, LINDA Other Clinician: Referring Emylia Latella: Darreld McleanMILES, LINDA Treating Shalena Ezzell/Extender: Linwood DibblesSTONE III, HOYT Weeks in  Treatment: 135 Encounter Discharge Information Items Post Procedure Vitals Discharge Condition: Stable Temperature (F): 98.2 Ambulatory Status: Ambulatory Pulse (bpm): 87 Discharge Destination: Home Respiratory Rate (breaths/min): 16 Transportation: Private Auto Blood Pressure (mmHg): 155/85 Accompanied By: self Schedule Follow-up Appointment: Yes Clinical Summary of Care: Electronic Signature(s) Signed: 08/19/2018 1:47:23 PM By: Curtis Sitesorthy, Joanna Entered By: Curtis Sitesorthy, Joanna on 08/19/2018 08:34:07 [name], [name] ([id number]) -------------------------------------------------------------------------------- Lower Extremity Assessment Details Patient Name: [name], [name] Date of Service: 08/19/2018 8:00 AM Medical Record Number: [id number] Patient Account Number: [id number] Date of Birth/Sex: Feb 02, 1946 [age] y.o. M) Treating RN: Huel CoventryWoody, Kim Primary Care Lanika Colgate: Darreld McleanMILES, LINDA Other Clinician: Referring Anyelo Mccue: Darreld McleanMILES, LINDA Treating Shaleah Nissley/Extender: Linwood DibblesSTONE III, HOYT Weeks in Treatment: 135 Vascular Assessment Pulses: Dorsalis Pedis Palpable: [Right:Yes] Posterior Tibial Extremity colors, hair growth, and conditions: Extremity Color: [Right:Hyperpigmented] Hair Growth on Extremity: [Right:No] Temperature of Extremity: [Right:Warm] Capillary Refill: [Right:< 3 seconds] Toe Nail Assessment Left: Right: Discolored: Yes Deformed: No Improper Length and Hygiene: No Electronic Signature(s) Signed: 08/19/2018 1:27:23 PM By: Elliot GurneyWoody, BSN, RN, CWS, Kim RN, BSN Entered By: Elliot GurneyWoody, BSN, RN, CWS, Kim on 08/19/2018 08:09:52 [name], [name] ([id number]) -------------------------------------------------------------------------------- Multi Wound Chart Details Patient Name: [name], [name] Date of Service: 08/19/2018 8:00 AM Medical Record Number: [id number] Patient Account Number: [id number] Date of Birth/Sex: Feb 02, 1946 [age] y.o. M) Treating RN: Curtis Sitesorthy, Joanna Primary  Care Madeliene Tejera: Darreld McleanMILES, LINDA Other Clinician: Referring Hetvi Shawhan: Darreld McleanMILES, LINDA Treating Mackson Botz/Extender: STONE III, HOYT Weeks in Treatment: 135 Vital Signs Height(in): 76 Pulse(bpm): 87 Weight(lbs): 238 Blood Pressure(mmHg): 155/85 Body Mass Index(BMI): 29 Temperature(F): 98.2 Respiratory Rate 16 (breaths/min): Photos: [1:No Photos] [2:No Photos] [N/A:N/A] Wound Location: [1:Right, Lateral Malleolus] [2:Right, Medial Malleolus] [N/A:N/A] Wounding Event: [1:Gradually Appeared] [2:Gradually Appeared] [N/A:N/A] Primary Etiology: [1:Venous Leg Ulcer] [2:Venous Leg Ulcer] [N/A:N/A] Date Acquired: [1:08/11/2015] [2:01/30/2016] [N/A:N/A] Weeks of Treatment: [1:135] [2:133] [N/A:N/A] Wound Status: [1:Open] [2:Open] [N/A:N/A] Clustered Wound: [1:No] [2:Yes] [N/A:N/A] Measurements L x W x D [1:4.8x4.5x0.2] [2:2.5x2.2x0.2] [N/A:N/A] (cm) Area (cm) : [1:16.965] [2:4.32] [N/A:N/A]  Volume (cm) : [1:3.393] [2:0.864] [N/A:N/A] % Reduction in Area: [1:1.80%] [2:62.60%] [N/A:N/A] % Reduction in Volume: [1:34.50%] [2:25.20%] [N/A:N/A] Classification: [1:Full Thickness Without Exposed Support Structures] [2:Full Thickness Without Exposed Support Structures] [N/A:N/A] Periwound Skin Texture: [1:No Abnormalities Noted] [2:No Abnormalities Noted] [N/A:N/A] Periwound Skin Moisture: [1:No Abnormalities Noted] [2:No Abnormalities Noted] [N/A:N/A] Periwound Skin Color: [1:No Abnormalities Noted No] [2:No Abnormalities Noted No] [N/A:N/A N/A] Treatment Notes Electronic Signature(s) Signed: 08/19/2018 1:47:23 PM By: Curtis Sites Entered By: Curtis Sites on 08/19/2018 08:17:01 [name] Stack ([id number]) -------------------------------------------------------------------------------- Multi-Disciplinary Care Plan Details Patient Name: [name] Stack Date of Service: 08/19/2018 8:00 AM Medical Record Number: [id number] Patient Account Number: [id number] Date of Birth/Sex: [birth year] (72  y.o. M) Treating RN: Curtis Sites Primary Care Ivelis Norgard: [name] Other Clinician: Referring Jonie Burdell: [name] Treating Rajvi Armentor/Extender: STONE III, HOYT Weeks in Treatment: 135 Active Inactive Venous Leg Ulcer Nursing Diagnoses: Knowledge deficit related to disease process and management Goals: Patient will maintain optimal edema control Date Initiated: 04/03/2016 Target Resolution Date: 11/24/2016 Goal Status: Active Interventions: Compression as ordered Treatment Activities: Therapeutic compression applied : 04/03/2016 Notes: Wound/Skin Impairment Nursing Diagnoses: Impaired tissue integrity Goals: Ulcer/skin breakdown will heal within 14 weeks Date Initiated: 01/17/2016 Target Resolution Date: 11/24/2016 Goal Status: Active Interventions: Assess ulceration(s) every visit Notes: Electronic Signature(s) Signed: 08/19/2018 1:47:23 PM By: Curtis Sites Entered By: Curtis Sites on 08/19/2018 08:16:53 [name] Stack ([id number]) -------------------------------------------------------------------------------- Pain Assessment Details Patient Name: [name] Stack Date of Service: 08/19/2018 8:00 AM Medical Record Number: [id number] Patient Account Number: [id number] Date of Birth/Sex: 1945-10-20 (72 y.o. M) Treating RN: Huel Coventry Primary Care Lillyen Schow: [name] Other Clinician: Referring Elese Rane: [name] Treating Rocklin Soderquist/Extender: Linwood Dibbles, HOYT Weeks in Treatment: 135 Active Problems Location of Pain Severity and Description of Pain Patient Has Paino No Site Locations With Dressing Change: No Pain Management and Medication Current Pain Management: Electronic Signature(s) Signed: 08/19/2018 1:27:23 PM By: Elliot Gurney, BSN, RN, CWS, Kim RN, BSN Entered By: Elliot Gurney, BSN, RN, CWS, Kim on 08/19/2018 08:05:33 [name] Stack ([id number]) -------------------------------------------------------------------------------- Patient/Caregiver  Education Details Patient Name: [name] Stack Date of Service: 08/19/2018 8:00 AM Medical Record Number: [id number] Patient Account Number: [id number] Date of Birth/Gender: [birth year] (72 y.o. M) Treating RN: Curtis Sites Primary Care Physician: [name] Other Clinician: Referring Physician: [name] Treating Physician/Extender: Skeet Simmer in Treatment: 135 Education Assessment Education Provided To: Patient Education Topics Provided Wound/Skin Impairment: Handouts: Other: wound care to continue as ordered Methods: Demonstration, Explain/Verbal Responses: State content correctly Electronic Signature(s) Signed: 08/19/2018 1:47:23 PM By: Curtis Sites Entered By: Curtis Sites on 08/19/2018 08:34:55 [name] Stack ([id number]) -------------------------------------------------------------------------------- Wound Assessment Details Patient Name: [name] Stack Date of Service: 08/19/2018 8:00 AM Medical Record Number: [id number] Patient Account Number: [id number] Date of Birth/Sex: 09-12-1945 (71 y.o. M) Treating RN: Huel Coventry Primary Care Jhanvi Drakeford: [name] Other Clinician: Referring Aftin Lye: [name] Treating Sequita Wise/Extender: Linwood Dibbles, HOYT Weeks in Treatment: 135 Wound Status Wound Number: 1 Primary Etiology: Venous Leg Ulcer Wound Location: Right, Lateral Malleolus Wound Status: Open Wounding Event: Gradually Appeared Date Acquired: 08/11/2015 Weeks Of Treatment: 135 Clustered Wound: No Photos Photo Uploaded By: Elliot Gurney, BSN, RN, CWS, Kim on 08/19/2018 11:45:13 Wound Measurements Length: (cm) 4.8 Width: (cm) 4.5 Depth: (cm) 0.2 Area: (cm) 16.965 Volume: (cm) 3.393 % Reduction in Area: 1.8% % Reduction in Volume: 34.5% Wound Description Full Thickness Without Exposed Support Classification: Structures Periwound Skin Texture Texture Color No Abnormalities Noted: No No Abnormalities Noted: No Moisture No  Abnormalities Noted: No Treatment Notes Wound #1 (Right, Lateral Malleolus) Notes hydrafera blue, abd, conform Electronic Signature(s) [name], Manson C. ([id number]) Signed: 08/19/2018 1:27:23 PM By: Elliot GurneyWoody, BSN, RN, CWS, Kim RN, BSN Entered By: Elliot GurneyWoody, BSN, RN, CWS, Kim on 08/19/2018 08:09:12 [name], Cezar C. ([id number]) -------------------------------------------------------------------------------- Wound Assessment Details Patient Name: [name], Garvin C. Date of Service: 08/19/2018 8:00 AM Medical Record Number: [id number] Patient Account Number: [id number] Date of Birth/Sex: [birth year] [age] y.o. M) Treating RN: Huel CoventryWoody, Kim Primary Care Kimori Tartaglia: Darreld McleanMILES, LINDA Other Clinician: Referring Pammie Chirino: Darreld McleanMILES, LINDA Treating Vue Pavon/Extender: Linwood DibblesSTONE III, HOYT Weeks in Treatment: 135 Wound Status Wound Number: 2 Primary Etiology: Venous Leg Ulcer Wound Location: Right, Medial Malleolus Wound Status: Open Wounding Event: Gradually Appeared Date Acquired: 01/30/2016 Weeks Of Treatment: 133 Clustered Wound: Yes Photos Photo Uploaded By: Elliot GurneyWoody, BSN, RN, CWS, Kim on 08/19/2018 11:45:14 Wound Measurements Length: (cm) 2.5 Width: (cm) 2.2 Depth: (cm) 0.2 Area: (cm) 4.32 Volume: (cm) 0.864 % Reduction in Area: 62.6% % Reduction in Volume: 25.2% Wound Description Full Thickness Without Exposed Support Classification: Structures Periwound Skin Texture Texture Color No Abnormalities Noted: No No Abnormalities Noted: No Moisture No Abnormalities Noted: No Treatment Notes Wound #2 (Right, Medial Malleolus) Notes hydrafera blue, abd, conform Electronic Signature(s) [name], Gurjit C. ([id number]) Signed: 08/19/2018 1:27:23 PM By: Elliot GurneyWoody, BSN, RN, CWS, Kim RN, BSN Entered By: Elliot GurneyWoody, BSN, RN, CWS, Kim on 08/19/2018 08:09:13 [name], Diane C. ([id number]) -------------------------------------------------------------------------------- Vitals Details Patient Name:  [name], Jordy C. Date of Service: 08/19/2018 8:00 AM Medical Record Number: [id number] Patient Account Number: [id number] Date of Birth/Sex: [birth year] [age] y.o. M) Treating RN: Huel CoventryWoody, Kim Primary Care Jahaan Vanwagner: Darreld McleanMILES, LINDA Other Clinician: Referring Shontell Prosser: Darreld McleanMILES, LINDA Treating Jenney Brester/Extender: Linwood DibblesSTONE III, HOYT Weeks in Treatment: 135 Vital Signs Time Taken: 08:05 Temperature (F): 98.2 Height (in): 76 Pulse (bpm): 87 Weight (lbs): 238 Respiratory Rate (breaths/min): 16 Body Mass Index (BMI): 29 Blood Pressure (mmHg): 155/85 Reference Range: 80 - 120 mg / dl Electronic Signature(s) Signed: 08/19/2018 1:27:23 PM By: Elliot GurneyWoody, BSN, RN, CWS, Kim RN, BSN Entered By: Elliot GurneyWoody, BSN, RN, CWS, Kim on 08/19/2018 08:05:54

## 2018-08-20 NOTE — Progress Notes (Signed)
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 patient is noted pain in the area over the last day or 2. There are four new tiny wounds in this area. His  co-pay for TheraSkin application is really high I think beyond her  means 02/07/16; patient is improved C+S cultures MSSA completed Doxy. using iodoflex 02/15/16; patient arrived today with the wound and roughly the same condition. Extensive area on the right lateral foot and ankle. Using Iodoflex. He came in last week with a cluster of new wounds on the medial aspect of the same ankle. 02/22/16; once again the patient complains of a lot of drainage coming out of this wound. We brought him back in on Friday for a dressing change has been using Iodoflex. States his pain level is better 02/29/16; still complaining of a lot of drainage even though we are putting absorbent material over the Santyl and bringing him back on Fridays for dressing changes. He is not complaining of pain. Her intake nurse notes blistering 03/07/16: pt returns today for f/u. he admits out in rain on Saturday and soaked his right leg. he did not share with his wife and he didn't notify the [facility]. he has an odor today that is c/w pseudomonas. Wound has greenish tan slough. there is no periwound erythema, induration, or fluctuance. wound has deteriorated since previous visit. denies fever, chills, body aches or malaise. no increased pain. 03/13/16: C+S showed proteus. He has not received AB'S. Switched to RTD last week. 03/27/16 patient is been using Iodoflex. Wound bed has improved and debridement is certainly easier 04/10/2016 -- he has been scheduled for a venous duplex study towards the end of the month 04/17/16; has been using silver alginate, states that the Iodoflex was hurting his wound and since that is been changed he has had no pain unfortunately the surface of the wound continues to be unhealthy with thick gelatinous slough and nonviable tissue. The wound will not heal like this. 04/20/2016 -- the patient was here for a nurse visit but I was asked to see the patient as the slough was quite significant and the nurse needed for  clarification regarding the ointment to be used. 04/24/16; the patient's wounds on the right medial and right lateral ankle/malleolus both look a lot better today. Less adherent slough healthier tissue. Dimensions better especially medially 05/01/16; the patient's wound surface continues to improve however he continues to require debridement switch her easier each week. Continue Santyl/Metahydrin mixture Hydrofera Blue next week. Still drainage on the medial aspect according to the intake nurse 05/08/16; still using Santyl and Medihoney. Still a lot of drainage per her intake nurse. Patient has no complaints pain fever chills etc. 05/15/16 switched the Hydrofera Blue last week. Dimensions down especially in the medial right leg wound. Area on the lateral which is more substantial also looks better still requires debridement 05/22/16; we have been using Hydrofera Blue. 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 Currently he tells me that the pain is doing very well about a 1 out of 10 today. Yesterday was a little bit worse but he tells me that he was more active watering his flowers that day. Overall he feels that his symptoms are doing significantly better at this point in time. His edema continues to be controlled well with the 4-layer compression wrap and he really has not noted any odor at this point in time. He is tolerating the dressing changes when they are performed well. 06/05/16 at this point in time today patient currently shows no interval signs or symptoms of local or systemic infection.  Again his pain level he rates to be a 1 out of 10 at most and overall he tells me that generally this is not giving him much trouble. In fact he even feels maybe a little bit better than last week. We  have continue with the 4-layer compression wrap in which she tolerates very well at this point. He is continuing to utilize the [place]. 06/12/16 I think there has been some progression in the status of both of these wounds over today again covered in a gelatinous surface. Has been using Hydrofera Blue. We had used Iodoflex in the past I'm not sure if there was an issue other than changing to something that might progress towards closure faster 06/19/16; he did not tolerate the Flexeril last week secondary to pain and this was changed on Friday back to [facility] area he continues to have copious amounts of gelatinous surface slough which is think inhibiting the speed of healing this area 06/26/16 patient over the last week has utilized the Santyl to try to loosen up some of the tightly adherent slough that was noted on evaluation last week. The good news is he tells me that the medial malleoli region really does not bother him the right llateral malleoli region is more tender to palpation at this point in time especially in the central/inferior location. However it does appear that the Santyl has done his job to loosen up the adherent slough at this point in time. Fortunately he has no interval signs or symptoms of infection locally or systemically no purulent discharge noted. 07/03/16 at this point in time today patient's wounds appear to be significantly improved over the right medial and lateral malleolus locations. He has much less tenderness at this point in time and the wounds appear clean her although there is still adherent slough this is sufficiently improved over what I saw last week. I still see no evidence of local infection. 07/10/16; continued gradual improvement in the right medial and lateral malleolus locations. The lateral is more substantial wound now divided into 2 by a rim of normal epithelialization. Both areas have adherent surface slough and  nonviable subcutaneous tissue 07-17-16- He continues to have progress to his right medial and lateral malleolus ulcers. He denies any complaints of pain or intolerance to compression. Both ulcers are smaller in size oriented today's measurements, both are covered with a softly adherent slough. 07/24/16; medial wound is smaller, lateral about the same although surface looks better. Still using Hydrofera Blue 07/31/16; arrives today complaining of pain in the lateral part of his foot. Nurse reports a lot more drainage. He has been using Hydrofera Blue. Switch to silver alginate today 08/03/2016 -- I was asked to see the patient was here for a nurse visit today. I understand he had a lot of pain in his right lower extremity and was having blisters on his right foot which have not been there before. Though he started on doxycycline he does not have blisters elsewhere on his body. I do not believe this is a drug allergy. also mentioned that there was a copious purulent discharge from the wound and clinically there is no evidence of cellulitis. 08/07/16; I note that the patient came in for his nurse check on Friday apparently with blisters on his toes on the right than a lot of swelling in his forefoot. He continued on the doxycycline that I had prescribed on 12/8. A culture was done of the lateral wound that showed a combination  of a few Proteus and Pseudomonas. Doxycycline might of covered the Proteus but would be unlikely to cover the Pseudomonas. He is on Coumadin. He arrives in the clinic today feeling a lot better states the pain is a lot better but nothing specific really was done other than to rewrap the foot also noted that he had arterial studies ordered in August although these were never done. It is reasonable to go ahead and reorder these. 08/14/16; generally arrives in a better state today in terms of the wounds he has taken cefdinir for one week. Our intake nurse reports copious amounts  of drainage but the patient is complaining of much less pain. He is not had his PT and INR checked and I've asked him to do this today or tomorrow. 08/24/2016 -- patient arrives today after 10 days and said he had a stomach upset. His arterial study was done and I have reviewed this report and find it to be within normal limits. However I did not note any venous duplex studies for reflux, and Dr. Leanord Hawkingobson may have ordered these in the past but I will leave it to him to decide if he needs these. The patient has finished his course of cefdinir. 08/28/16; patient arrives today again with copious amounts of thick really green drainage for our intake nurse. He states he has a very tender spot at the superior part of the lateral wound. Wounds are larger 09/04/16; no real change in the condition of this patient's wound still copious amounts of surface slough. Started him on Iodoflex last week he is completing another course of Cefdinir or which I think was done empirically. His arterial study showed ABIs were 1.1 on the right 1.5 on the left. He did have a slightly reduced ABI in the right the left one was not obtained. Had calcification of the right posterior tibial artery. The interpretation was no segmental stenosis. His waveforms were triphasic. [name] StackBLACKWELL, Zaidyn C. ([id number]) His reflux studies are later this month. Depending on this I'll send him for a vascular consultation, he may need to see plastic surgery as I believe he is had plastic surgery on this foot in the past. He had an injury to the foot in the 1980s. 1/16 /18 right lateral greater than right medial ankle wounds on the right in the setting of previous skin grafting. 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 He does not have arterial issues. Each week he comes in with the same adherent surface slough although there was less of this today 09/18/16; right lateral  greater than right medial lower extremity wounds in the setting of previous skin grafting and trauma. He has least to vein laser ablation scheduled for February 2 for venous reflux. He does not have significant arterial disease. Problem has been very difficult to handle surface slough/necrotic tissue. Recently using Iodoflex for this with some, albeit slow improvement 09/25/16; right lateral greater than right medial lower extremity venous wounds in the setting of previous skin grafting. He is going for ablation surgery on February 2 after this he'll come back here for rewrap. He has been using Iodoflex as the primary dressing. 10/02/16; right lateral greater than right medial lower extremity wounds in the setting of previous skin grafting. He had his ablation surgery last week, I don't have a report. He tolerated this well. Came in with a thigh-high Unna boots on Friday. We  have been using Iodoflex as the primary dressing. His measurements are improving 10/09/16; continues to make nice aggressive in terms of the wounds on his lateral and medial right ankle in the setting of previous skin grafting. Yesterday he noticed drainage at one of his surgical sites from his venous ablation on the right calf. He took off the bandage over this area felt a "popping" sensation and a reddish-brown drainage. He is not complaining of any pain 10/16/16; he continues to make nice progression in terms of the wounds on the lateral and medial malleolus. Both smaller using Iodoflex. He had a surgical area in his posterior mid calf we have been using iodoform. All the wounds are down and dimensions 10/23/16; the patient arrives today with no complaints. He states the Iodoflex is a bit uncomfortable. He is not systemically unwell. We have been using Iodoflex to the lateral right ankle and the medial and Aquacel Ag to the reflux surgical wound on the posterior right calf. All of these wounds are doing well 10/30/16; patient  states he has no pain no systemic symptoms. I changed him to [facility] last week. Although the wounds are doing well 11/06/16; patient reports no pain or systemic symptoms. We continue with Hydrofera Blue. Both wound areas on the medial and lateral ankle appear to be doing well with improvement and dimensions and improvement in the wound bed. 11/13/16; patient's dimensions continued to improve. We continue with Hydrofera Blue on the medial and lateral side. Appear to be doing well with healthy granulation and advancing epithelialization 11/20/16; patient's dimensions improving laterally by about half a centimeter in length. Otherwise no change on the medial side. Using [facility] 12/04/16; no major change in patient's wound dimensions. Intake nurse reports more drainage. 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 He is voicing no complaints or concerns. He has been tolerating his twice weekly compression therapy changes 12/11/16 Patient complains of pain and increased drainage.. wants hydrofera blue 12/18/16 improvement. Sorbact 12/25/16; medial wound is smaller, lateral measures the same. Still on sorbact 01/01/17; medial wound continues to be smaller, lateral measures about the same however there is clearly advancing epithelialization here as well in fact I think the wound will ultimately divided into 2 open areas 01/08/17; unfortunately today fairly significant regression in several areas. Surface of the lateral wound covered again in adherent necrotic material which is difficult to debridement. He has significant surrounding skin maceration. The expanding area of tissue epithelialization in the middle of the wound that was encouraging last week appears to be smaller. There is no surrounding tenderness. The area on the medial leg also did not seem to be as healthy as last week, the reason  for this regression this week is not totally clear. We have been using Sorbact for the last 4 weeks. We'll switched of polymen AG which we will order via home medical supply. If there is a problem with this would switch back to Iodoflex 01/15/18; drainage,odor. No change. Switched to polymen last week 01/22/17; still continuous drainage. Culture I did last week showed a few Proteus pansensitive. I did this culture because of drainage. Put him on Augmentin which she has been taking since Saturday however he is developed 4-5 liquid bowel movements. He is also on Coumadin. Beyond this wound is not changed at all, still nonviable necrotic surface material which I debrided reveals healthy granulation line 01/29/17; still copious amounts  of drainage reported by her intake nurse. Wound measuring slightly smaller. Currently fact on Iodoflex although I'm looking forward to changing back to perhaps [facility] or polymen AG 02/05/17; still large amounts of drainage and presenting with really large amounts of adherent slough and necrotic material over the remaining open area of the wound. We have been using Iodoflex but with little improvement in the surface. Change to [facility] 02/12/17; still large amount of drainage. Much less adherent slough however. Started [employer]	[place] last week [name], [name] ([id number]) 02/19/17; drainage is better this week. Much less adherent slough. Perhaps some improvement in dimensions. Using [facility] 02/26/17; severe venous insufficiency wounds on the right lateral and right medial leg. Drainage is some better and slough is less adherent we've been using Hydrofera Blue 03/05/17 on evaluation today patient appears to be doing well. His wounds have been decreasing in size and overall he is pleased with how this is progressing. We are awaiting approval for the epigraph which has previously been recommended in the meantime the [facility] Dressing is to be doing  very well for him. 03/12/17; wound dimensions are smaller still using Hydrofera Blue. Comes in on Fridays for a dressing change. 03/19/17; wound dimensions continued to contract. Healing of this wound is complicated by continuous significant drainage as well as recurrent buildup of necrotic surface material. We looked into Apligraf and he has a $290 co-pay per application but truthfully I think the drainage as well as the nonviable surface would preclude use of Apligraf there are any other skin substitute at this point therefore the continued plan will be debridement each clinic visit, 2 times a week dressing changes and continued use of Hydrofera Blue. improvement has been very slow but sustained 03/26/17 perhaps slight improvements in peripheral epithelialization is especially inferiorly. Still with large amount of drainage and tightly adherent necrotic surface on arrival. Along with the intake nurse I reviewed previous treatment. He worsened on Iodoflex and had a 4 week trial of sorbact, Polymen AG and a long courses of Aquacel Ag. He is not a candidate for advanced treatment options for many reasons 04/09/17 on evaluation today patient's right lower extremity wounds appear to be doing a little better. Fortunately he has no significant discomfort and has been tolerating the dressing changes including the wraps without complication. With that being said he really does not have swelling anymore compared to what he has had in the past following his vascular intervention. He wonders if potentially we could attempt avoiding the rats to see if he could cleanse the wound in between to try to prevent some of the fiber and its buildup that occurs in the interim between when we see him week to week. No fevers, chills, nausea, or vomiting noted at this time. 04/16/17; noted that the staff made a choice last week not to put him in compression. The patient is changing his dressing at home using [facility] and  changing every second day. His wife is washing the wound with saline. He is using kerlix. Surprisingly he has not developed a lot of edema. This choice was made because of the degree of fluid retention and maceration even with changing his dressing twice a week 04/23/17; absolutely no change. Using Hydrofera Blue. Recurrent tightly adherent nonviable surface material. This is been refractory to Iodoflex, sorbact and now Hydrofera Blue. More recently he has been changing his own dressings at home, cleansing the wound in the shower. He has not developed lower extremity edema  04/30/17; if anything the wound is larger still in adherent surface although it debrided easily today. I've been using Mehta honey for 1 week and I'd like to try and do it for a second week this see if we can get some form of viable surface here. 05/07/17; patient arrives with copious amounts of drainage and some pain in the superior part of the wound. He has not been systemically unwell. He's been changing this daily at home 05/14/17; patient arrives today complaining of less drainage and less pain. Dimensions slightly better. Surface culture I did of this last week grew MSSA I put him on dicloxacillin for 10 days. Patient requesting a further prescription since he feels so much better. He did not obtain the [facility] AG for reasons that aren't clear, he has been using Hydrofera Blue 05/21/17; perhaps some less drainage and less pain. He is completing another week of doxycycline. Unfortunately there is no change in the wound measurements are appearance still tightly adherent necrotic surface material that is really defied treatment. We have been using Hydrofera Blue most recently. He did not manage to get Anasept. He was told it was prescription at [facility] 05/29/17; absolutely no improvement in these wound areas. He had remote plastic surgery with who was done at [facility] in [place] surrounding this area. This was a traumatic  wound with extensive plastic surgery/skin grafting at that time. I switched him to sorbact last week to see if he can do anything about the recurrent necrotic surface and drainage. This is largely been refractory to Iodoflex/Hydrofera Blue/alginates. I believe we tried Elby [employer]ers and more recently Medi honey l however the drainage is really too excessive 06/04/17; patient went to see Dr. Ulice Bold. Per the patient she ordered him a compression stocking. Continued the Anasept gel and sorbact was already on. No major improvement in the condition of the wounds in fact the medial wound looks larger. Again per the patient she did not feel a skin graft or operative debridement was indicated The patient had previous venous ablation in February 2018. Last arterial studies were in December 2017 and were within normal limits 06/18/17; patient arrives back in clinic today using Anasept gel with sorbact. He changes this every day is wearing his own compression stocking. The patient actually saw Dr. Leta Baptist of plastic surgery. I've reviewed her note. The appointment was on 05/30/17. The basic issue with here was that she did not feel that skin grafts for patients with underlying stasis and edema have a good long-term success rate. She also discuss skin substitutes which has been done here as well however I have not been able to get the surface of these wounds to something that I think would support skin substitutes. This is why he felt he might need an [name], [name]. ([id number]) operative debridement more aggressively than I can do in this clinic Review of systems; is otherwise negative he feels well 07/02/17; patient has been using Anasept gel with Sorbact. He changes this every day scrubs out the wound beds. Arrives today with the wound bed looking some better. Easier debridement 07/16/17; patient has been using Anasept gel was sorbact for about a month now. The necrotic service on his wound bed is better  and the drainage is now down to minimal but we've not made any improvements in the epithelialization. Change to Santyl today. Surface of the wound is still not good enough to support skin substitutes 07/30/17 on evaluation today patient appears to be doing very well in regard  to his right bimalleolus wounds. He has been tolerating the treatment with the Anasept gel and sorbact. However we were going to try and switch to Santyl to be more aggressive with these wounds. This was however cost prohibitive. Therefore we will likely go back to the sorbact. Nonetheless he is not having any significant discomfort he still is forming slough over the wound location but nothing too significant. The wounds do appear to be filling in nicely. 08/13/17; I have not seen this wound in about a month. There is not much change. The fact the area medially looks larger. He has been using sorbact and Anasept for over a month now without much effect. Arrives in clinic stating that he does not want the area debrided 08/28/17; patient arrives with the areas on his right medial and right lateral ankle worse. The wounds of expanded there is erythema and still drainage. There is no pain and no tenderness. We had been using Keracel AG clearly not doing well. 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 He states that even the evening that we began utilizing the Iodoflex he started having burning. Subsequently this never really improved and he eventually discontinue the use of the Iodoflex altogether. Unfortunately he did not let us know about any of this until today. I'm unsure of any specific infection at this point although I am going to obtain a wound culture today in  order to see if there's anything that could potentially be causing an infection as well. It does sound as if he's had the Iodoflex before and did not have this kind of reaction although this does sound very specific for a reaction to the Iodoflex. 09/17/17 on evaluation today patient appears to be doing significantly better compared to last week's evaluation. At that time it actually appears that he did have an infection the good news is that we have been able to get this under control with switching to the [facility] solution he's not having as much pain and discomfort he still does have some erythema surrounding the medial aspect wound. He has been tolerating the Dakin's soaked gauze dressing which is excellent and that his wounds do not seem to have as much adherent slough. Overall I'm pleased with the progress she's made in last week. 11/05/17 on evaluation today patient appears to be doing better in regard to his right medial and lateral malleolus ulcers. He has been tolerating the dressing changes without complication. I do flight the Freada Bergeron is helping with additional granulation at this point in the wound beds do appear to be a little bit better. With that being said patient does not appear to have any evidence of significant infection at this point there is no erythema as he has previously noted he does note that the 30 day course of antibiotics I placed him on will end tomorrow. 11/12/17 on evaluation today patient actually appears to be doing excellent in regard to his right medial and lateral malleolus ulcers. He has been tolerating the dressing changes without complication. Fortunately he has no evidence of infection at this time and overall I believe he is progressing extremely nicely he has a lot of new epithelialization noted on evaluation today. Patient likewise is very  pleased with the progress even just since last week. 11/19/17 on evaluation today patient appears to be doing about the same  in regard to his ulcerations. He does not have any additional breakdown although he really has not noted any significant improvement either over the past week. With that being said the more concerning thing at this time is that he is beginning to show signs of erythema surrounding both wounds which is what typically happens and then he will develop into a more overt infection. This has been the course for some time. I hope that doing the 30 day in a biotic cycle this past time would break that so that he would not have the same type of thing happen again. Nonetheless it appears that is digressing back into this infected state unfortunately. With that being said he is not having any pain currently which is good he typically does start to hurt more as this progresses. Fortunately there does not appear to be any evidence of infection systemically which is good news. 11/26/17 on evaluation today patient appears to actually be doing rather well in regard to his ulcer compared to last week. He still has your theme and noted around both the medial and lateral malleolus ulcers of the right lower extremity. He does not seem to be quite as overtly infected however. He has been tolerating the dressing changes without complication which is good news. The gentamicin cream does seem to have been of benefit for him. With that being said he does actually have an appointment with Dr. Sampson Goon this morning to see if there's anything that would be recommended in the way of IV antibiotic Chilson, Eladio C. ([id number]) therapy. Otherwise he still has slough noted on the surface of the wound. 12/03/17 on evaluation today patient presents for follow-up concerning his ongoing right lower extremity ulcers. He has been tolerating the dressing changes without complication he states he has not really been using gentamicin on the lateral ankle and this seems to be doing very well. For that reason I think that is probably fine  to put a hold on the gentamicin he states his burns and stings when he applies it and we maybe be able to just use this intermittently when things become more irritated. Other than that the good news is I did review his MRI which was performed on 11/28/17 and revealed that he has a negative MRI for abscess, osteomyelitis, or septic joint. Obviously these were the issues that I was concerned with the possibility of and I'm pleased to find that there is no problems in that regard. I did also review Dr. Jarrett Ables note from infectious disease he discussed performed some lab work which apparently was done and then subsequently was going to consider the possibility of Keflex long-term for prevention of infection although this has not been prescribed as of yet. 12/09/17 on evaluation today patient appears to be doing a little bit worse in regard to his right medial and lateral malleolus ulcers. Fortunately the wound does not seem to be significantly worse although he does have a lot more drainage especially the lateral aspect he is having a lot more pain than what he has noted more recently. Fortunately there does not appear to be any evidence of systemic infection which is good news. Again he has seen Dr. Sampson Goon but he has not been placed on the potential Keflex which was recommended as a possibility at least yet. 12/17/17 unfortunately on evaluation today the patient's wound appears to  be doing much worse. He has been performing the dressing changes as directed. Upon a review of notes in epic it does appear that Dr. Jarrett Ables office specifically sending Keflex for the patient on the 16th which was last Tuesday. With that being said the patient states that though this was sent into [facility] that [facility] stated they never received it and subsequently the patient never took anything. He tells me he has been in bed since Friday due to the increased pain and overall general malaise. No fevers, chills,  nausea, or vomiting noted at this time. 02/25/18 on evaluation today patient appears to be doing rather well in regard to his bilateral right malleolus ulcers. He has been tolerating the dressing changes without complication. Currently there does not appear to be any evidence of infection. Overall I'm very pleased with the progress that seems to been made up to this point. 03/11/18 on evaluation today patient actually appears to be doing very well in regard to his right ankle ulcers. He's been tolerating the dressing changes without complication. Fortunately there does not appear to be any evidence of sniffing infection I do believe that the gentamicin cream continues to be of benefit for him. Fortunately he is showing signs of healing in which time I see him. He also tells me he's having no pain. 03/25/18 on evaluation today patient actually appears to show signs of improvement especially in regard to the right lateral ankle ulcer. He has been tolerating the dressing changes without complication. In general I'm very pleased with the progress that has been made up to this point. 04/08/18- He presents in follow up evaluation for right bimalleolar wounds; there is essentially no change. He admits to removing the dressing at night to allow the wounds to be "air out". We will continue with same treatment plan and he will follow up in two weeks 04/22/18 on evaluation today patient seems very frustrated with this situation currently as far as the ulcer on his right medial and lateral malleolus is concerned. He states this has been going on a very long time and obviously he does have a lot of bills as well is far as wound care is concerned. With that being said he tells me that he's been tolerating the dressing changes well although he did clarify that what he is doing is putting the medicine on in the morning after shower and then subsequently he takes the dressing off at night leaving it open to air. I  previously asked him about this and I was not aware that he was doing this. Nonetheless that may be slowing things down a bit at this point. Fortunately there does not appear to be any evidence of severe infection although he does have some evidence of your theme is surrounding the wound bed. He is still using the gentamicin cream. 05/06/18 on evaluation today patient actually presents for evaluation of his right medial malleolus ulcers. He's been tolerating the dressing changes without complication. With that being said it does appear that he is doing better compared to last evaluation. He's not having the pain like he did previous. He also states that blisters that he had coming up when I saw him last did resolve and ended up doing okay he did go to the ER they gave him some cream to put on it he's not sure what the name of the cream was. He was down in [facility] when he called me and I spoke with him previous. Nonetheless he states  that they told him to put the medicine on his our due list. With that being said I'm not really 100% sure that the medicine had anything to do with this due to the fact that again he was having the blisters when he came into the office which is why we actually put them on the anabiotic I was concerned about him having an infection which was causing the blistering and increased pain in his extremity. Nonetheless this is something that we can definitely be cautious of in the future it was Bactrim Kamphuis, [name] ([id number]) that we had him on. Overall I'm glad he is doing better. 05/20/18 on evaluation today patient actually appears to be doing fairly well in regard to his ulcer on the right lateral and medial malleolus locations. Both seem to be doing decently well he does have slough building up on the surface of the wound. He has been using the Hibiclense as I instructed him. Overall there's no evidence of significant infection which is good news. 06/02/18 on  evaluation today patient actually appears to be doing excellent in regard to his right medial and lateral malleolus ulcers. He has been tolerating the dressing changes without complication. There does not appear to be any evidence of infection at this time which is great news. In general I feel like he has made great progress in the past several weeks. He has good granulation noted over areas that have not had good granulation previously and epithelialization even more so in several areas. Both wounds measured smaller. 06/17/18 an evaluation today patient actually appears to be doing excellent in regard to his right lateral and medial malleolus ulcers. He has been tolerating the dressing changes without complication fortunately the Hydrofera Blue Dressing to be doing excellent for him. He's actually making progress each time I see him. 07/08/18 on evaluation today patient's wounds on the right medial and lateral malleolus or region show signs of improvement currently. He has been tolerating the dressing changes without complication at this time. He does feel like the [facility] Dressing has been of benefit for him. No fevers, chills, nausea, or vomiting noted at this time. 07/22/18 and evaluation today patient actually appears to be doing excellent in regard to his right medial and lateral malleolus ulcers. He's been tolerating the dressing changes without complication the Hydrofera Blue Dressing years be excellent for him. Overall I'm extremely pleased in this regard. 08/05/18 on evaluation today patient's right lateral and medial malleolus ulcers appear to be doing well at this point. There does not appear to be any evidence of infection which is good news. Overall he does have some Slough noted which is going to require sharp debridement but other than this I see no current issues 08/19/18 on evaluation today patient actually appears to be doing better yet again in regard to his lower [place]  ulcer on the right medial and lateral malleolus regions. He is having no pain and no significant signs of infection which is great news. Overall things seem to be progressing quite nicely which is great news. No fevers, chills, nausea, or vomiting noted at this time. Patient History Information obtained from Patient. Social History Former smoker - 10 years ago, Marital Status - Married, Alcohol Use - Moderate, Drug Use - No History, Caffeine Use - Never. Medical And Surgical History Notes Constitutional Symptoms (General Health) Vein Filter (groin area); CODA; H/O Blood Clots; pulmonary hypertensive arterial disease Review of Systems (ROS) Constitutional Symptoms (General Health) Denies complaints or  symptoms of Fever, Chills. Respiratory The patient has no complaints or symptoms. Cardiovascular The patient has no complaints or symptoms. Psychiatric The patient has no complaints or symptoms. OCTAVIS, [name] ([id number]) Objective Constitutional Well-nourished and well-hydrated in no acute distress. Vitals Time Taken: 8:05 AM, Height: 76 in, Weight: 238 lbs, BMI: 29, Temperature: 98.2 F, Pulse: 87 bpm, Respiratory Rate: 16 breaths/min, Blood Pressure: 155/85 mmHg. Respiratory normal breathing without difficulty. Psychiatric this patient is able to make decisions and demonstrates good insight into disease process. Alert and Oriented x 3. pleasant and cooperative. General Notes: Patient's wound bed currently show signs of good granulation at this time the was some [facility] noted which require sharp debridement today. Post debridement the wound bed appears to be doing significantly better which is great news he had pain only at one location about in the central portion of one otherwise everything appears to be excellent. Integumentary (Hair, Skin) Wound #1 status is Open. Original cause of wound was Gradually Appeared. The wound is located on the Right,Lateral Malleolus. The  wound measures 4.8cm length x 4.5cm width x 0.2cm depth; 16.965cm^2 area and 3.393cm^3 volume. Wound #2 status is Open. Original cause of wound was Gradually Appeared. The wound is located on the Right,Medial Malleolus. The wound measures 2.5cm length x 2.2cm width x 0.2cm depth; 4.32cm^2 area and 0.864cm^3 volume. 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 There was a Excisional Skin/Subcutaneous Tissue Debridement with a total area of 21.6 sq cm performed by STONE III, HOYT E., PA-C. With the following instrument(s): Curette to remove Viable and Non-Viable tissue/material. Material removed includes Subcutaneous Tissue and Slough and after achieving pain control using Lidocaine 4% Topical Solution. No specimens were taken. A time out was conducted at 08:20, prior to the start of the procedure. A Minimum amount of bleeding was controlled with Pressure. The procedure was tolerated well with a pain level of 0 throughout [name], [name] ([id number]) and a pain level of 0 following the procedure. Post Debridement Measurements: 4.8cm length x 4.5cm width x 0.3cm depth; 5.089cm^3 volume. Character of Wound/Ulcer Post Debridement is improved. Severity of Tissue Post Debridement is: Fat layer exposed. Post procedure Diagnosis Wound #1: Same as Pre-Procedure Wound #2 Pre-procedure diagnosis of Wound #2 is a Venous Leg Ulcer located on the Right,Medial Malleolus .Severity of Tissue Pre Debridement is: Fat  layer exposed. There was a Excisional Skin/Subcutaneous Tissue Debridement with a total area of 5.5 sq cm performed by STONE III, HOYT E., PA-C. With the following instrument(s): Curette to remove Viable and Non-Viable tissue/material. Material removed includes Subcutaneous Tissue and Slough and after achieving pain control using Lidocaine 4% Topical Solution. No specimens were taken. A time out was conducted at 08:18, prior to the start of the procedure. A Minimum amount of bleeding was controlled with Pressure. The procedure was tolerated well with a pain level of 0 throughout and a pain level of 0 following the procedure. Post Debridement Measurements: 2.5cm length x 2.2cm width x 0.3cm depth; 1.296cm^3 volume. Character of Wound/Ulcer Post Debridement is improved. Severity of Tissue Post Debridement is: Fat layer exposed. Post procedure Diagnosis  Wound #2: Same as Pre-Procedure Plan Wound Cleansing: Wound #1 Right,Lateral Malleolus: Clean wound with Normal Saline. May Shower, gently pat wound dry prior to applying new dressing. - wash with hibiclens daily and leave on 5 mins Wound #2 Right,Medial Malleolus: Clean wound with Normal Saline. May Shower, gently pat wound dry prior to applying new dressing. - wash with hibiclens and leave on 5 mins Anesthetic (add to Medication List): Wound #1 Right,Lateral Malleolus: Topical Lidocaine 4% cream applied to wound bed prior to debridement (In Clinic Only). Wound #2 Right,Medial Malleolus: Topical Lidocaine 4% cream applied to wound bed prior to debridement (In Clinic Only). Primary Wound Dressing: Wound #1 Right,Lateral Malleolus: Hydrafera Blue Ready Transfer Wound #2 Right,Medial Malleolus: Hydrafera Blue Ready Transfer Secondary Dressing: Wound #1 Right,Lateral Malleolus: Dry Gauze Conform/Kerlix Wound #2 Right,Medial Malleolus: Dry Gauze Conform/Kerlix Dressing Change Frequency: Wound #1 Right,Lateral Malleolus: Change dressing  every other day. Wound #2 Right,Medial Malleolus: Change dressing every other day. Follow-up Appointments: Wound #1 Right,Lateral Malleolus: Return Appointment in 2 weeks. Wound #2 Right,Medial Malleolus: Return Appointment in 2 weeks. KAYTON, DUNAJ ([id number]) Edema Control: Wound #1 Right,Lateral Malleolus: Elevate legs to the level of the heart and pump ankles as often as possible Wound #2 Right,Medial Malleolus: Elevate legs to the level of the heart and pump ankles as often as possible Additional Orders / Instructions: Wound #1 Right,Lateral Malleolus: Increase protein intake. Wound #2 Right,Medial Malleolus: Increase protein intake. I'm gonna recommend currently that we continue with the above wound care measures for the next week. The patient is in agreement with the plan. We will subsequently see were things stand at follow-up. Please see above for specific wound care orders. We will see patient for re-evaluation in 2 week(s) here in the clinic. If anything worsens or changes patient will contact our office for additional recommendations. Electronic Signature(s) Signed: 08/19/2018 1:28:21 PM By: Lenda Kelp PA-C Entered By: Lenda Kelp on 08/19/2018 08:27:21 [name] ([id number]) -------------------------------------------------------------------------------- ROS/PFSH Details Patient Name: [name] Date of Service: 08/19/2018 8:00 AM Medical Record Number: [id number] Patient Account Number: [id number] Date of Birth/Sex: 04-10-1946 (72 y.o. M) Treating RN: Curtis Sites Primary Care Provider: [name] Other Clinician: Referring Provider: [name] Treating Provider/Extender: STONE III, HOYT Weeks in Treatment: 135 Information Obtained From Patient Wound History Do you currently have one or more open woundso Yes How many open wounds do you currently haveo 1 Approximately how long have you had your woundso 5 months How have  you been treating your wound(s) until nowo saline, dressing Has your wound(s) ever healed and then re-openedo Yes Have you had any lab work done in the past montho No Have you tested positive for an antibiotic resistant organism (MRSA, VRE)o No Have you tested positive for osteomyelitis (bone infection)o No Have you had any tests for circulation on your legso No Constitutional Symptoms (General Health) Complaints and Symptoms: Negative for: Fever; Chills Medical History: Past Medical History Notes: Vein Filter (groin area); CODA; H/O Blood Clots; pulmonary hypertensive arterial disease Eyes Medical History: Positive for: Cataracts - removed Negative for: Glaucoma; Optic Neuritis Ear/Nose/Mouth/Throat Medical History: Negative for: Chronic sinus problems/congestion Hematologic/Lymphatic Medical History: Negative for: Anemia; Hemophilia; Human Immunodeficiency Virus; Lymphedema; Sickle Cell Disease Respiratory Complaints and Symptoms: No Complaints or Symptoms Medical History: Positive for: Chronic Obstructive Pulmonary Disease (COPD) Negative for: Aspiration; Asthma; Pneumothorax; Sleep Apnea; Tuberculosis Cardiovascular ALAIN, DESCHENE. ([id number]) Complaints and Symptoms: No Complaints or Symptoms Medical History: Negative for: Angina; Arrhythmia; Congestive Heart  Failure; Coronary Artery Disease; Deep Vein Thrombosis; Hypertension; Hypotension; Myocardial Infarction; Peripheral Arterial Disease; Peripheral Venous Disease; Phlebitis; Vasculitis Gastrointestinal Medical History: Negative for: Cirrhosis ; Colitis; Crohnos; Hepatitis A; Hepatitis B; Hepatitis C Endocrine Medical History: Negative for: Type I Diabetes; Type II Diabetes Genitourinary Medical History: Negative for: End Stage Renal Disease Immunological Medical History: Negative for: Lupus Erythematosus; Raynaudos Integumentary (Skin) Medical History: Negative for: History of Burn; History of pressure  wounds Musculoskeletal Medical History: Positive for: Osteoarthritis Negative for: Gout; Rheumatoid Arthritis; Osteomyelitis Neurologic Medical History: Negative for: Dementia; Neuropathy; Quadriplegia; Paraplegia; Seizure Disorder Oncologic Medical History: Negative for: Received Chemotherapy; Received Radiation Psychiatric Complaints and Symptoms: No Complaints or Symptoms Medical History: Negative for: Anorexia/bulimia; Confinement Anxiety HBO Extended History Items [name] StackBLACKWELL, Elster C. ([id number]) Eyes: Cataracts Immunizations Pneumococcal Vaccine: Received Pneumococcal Vaccination: No Implantable Devices Family and Social History Former smoker - 10 years ago; Marital Status - Married; Alcohol Use: Moderate; Drug Use: No History; Caffeine Use: Never; Advanced Directives: No; Patient does not want information on Advanced Directives; Living Will: No; Medical Power of Attorney: No Physician Affirmation I have reviewed and agree with the above information. Electronic Signature(s) Signed: 08/19/2018 1:28:21 PM By: Lenda KelpStone III, Hoyt PA-C Signed: 08/19/2018 1:47:23 PM By: Curtis Sitesorthy, Joanna Entered By: Lenda KelpStone III, Hoyt on 08/19/2018 08:26:31 [name] StackBLACKWELL, Axtyn C. ([id number]) -------------------------------------------------------------------------------- SuperBill Details Patient Name: [name] StackBLACKWELL, Neel C. Date of Service: 08/19/2018 Medical Record Number: [id number] Patient Account Number: [id number] Date of Birth/Sex: 1946-02-03 [age] y.o. M) Treating RN: Curtis Sitesorthy, Joanna Primary Care Provider: Darreld McleanMILES, LINDA Other Clinician: Referring Provider: Darreld McleanMILES, LINDA Treating Provider/Extender: Linwood DibblesSTONE III, HOYT Weeks in Treatment: 135 Diagnosis Coding ICD-10 Codes Code Description I87.331 Chronic venous hypertension (idiopathic) with ulcer and inflammation of right lower extremity L97.312 Non-pressure chronic ulcer of right ankle with fat layer exposed T85.613S Breakdown (mechanical) of  artificial skin graft and decellularized allodermis, sequela T81.31XS Disruption of external operation (surgical) wound, not elsewhere classified, sequela Facility Procedures CPT4 Code: [id number] Description: 11042 - DEB SUBQ TISSUE 20 SQ CM/< ICD-10 Diagnosis Description L97.312 Non-pressure chronic ulcer of right ankle with fat layer ex Modifier: posed Quantity: 1 CPT4 Code: 3244010236100018 Description: 11045 - DEB SUBQ TISS EA ADDL 20CM ICD-10 Diagnosis Description L97.312 Non-pressure chronic ulcer of right ankle with fat layer ex Modifier: posed Quantity: 1 Physician Procedures CPT4 Code: [id number] Description: 11042 - WC PHYS SUBQ TISS 20 SQ CM ICD-10 Diagnosis Description L97.312 Non-pressure chronic ulcer of right ankle with fat layer exp Modifier: osed Quantity: 1 CPT4 Code: [id number] Description: 11045 - WC PHYS SUBQ TISS EA ADDL 20 CM ICD-10 Diagnosis Description L97.312 Non-pressure chronic ulcer of right ankle with fat layer exp Modifier: osed Quantity: 1 Electronic Signature(s) Signed: 08/19/2018 1:28:21 PM By: Lenda KelpStone III, Hoyt PA-C Entered By: Lenda KelpStone III, Hoyt on 08/19/2018 08:27:54

## 2018-09-02 ENCOUNTER — Ambulatory Visit: Payer: Medicare HMO | Admitting: Physician Assistant

## 2018-09-09 ENCOUNTER — Encounter: Payer: Medicare HMO | Attending: Physician Assistant | Admitting: Physician Assistant

## 2018-09-09 DIAGNOSIS — J449 Chronic obstructive pulmonary disease, unspecified: Secondary | ICD-10-CM | POA: Diagnosis not present

## 2018-09-09 DIAGNOSIS — Z87891 Personal history of nicotine dependence: Secondary | ICD-10-CM | POA: Insufficient documentation

## 2018-09-09 DIAGNOSIS — L97312 Non-pressure chronic ulcer of right ankle with fat layer exposed: Secondary | ICD-10-CM | POA: Diagnosis not present

## 2018-09-09 DIAGNOSIS — M1990 Unspecified osteoarthritis, unspecified site: Secondary | ICD-10-CM | POA: Diagnosis not present

## 2018-09-09 DIAGNOSIS — I87311 Chronic venous hypertension (idiopathic) with ulcer of right lower extremity: Secondary | ICD-10-CM | POA: Diagnosis not present

## 2018-09-09 DIAGNOSIS — I872 Venous insufficiency (chronic) (peripheral): Secondary | ICD-10-CM | POA: Diagnosis not present

## 2018-09-09 DIAGNOSIS — Z7901 Long term (current) use of anticoagulants: Secondary | ICD-10-CM | POA: Diagnosis not present

## 2018-09-09 DIAGNOSIS — Z86718 Personal history of other venous thrombosis and embolism: Secondary | ICD-10-CM | POA: Insufficient documentation

## 2018-09-09 DIAGNOSIS — E11622 Type 2 diabetes mellitus with other skin ulcer: Secondary | ICD-10-CM | POA: Diagnosis not present

## 2018-09-10 NOTE — Progress Notes (Signed)
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 05/15/16 switched the Hydrofera Blue last week. Dimensions down especially in the medial right leg wound. Area on the lateral which is more substantial also looks better still requires debridement 05/22/16; we have been using Hydrofera Blue. 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 Currently he tells me that the pain is doing very well about a 1 out of 10 today. Yesterday was a little bit worse but he tells me that he was more active watering his  flowers that day. Overall he feels that his symptoms are doing significantly better at this point in time. His edema continues to be controlled well with the 4-layer compression wrap and he really has not noted any odor at this point in time. He is tolerating the dressing changes when they are performed well. 06/05/16 at this point in time today patient currently shows no interval signs or symptoms of local or systemic infection. Again his pain level he rates to be a 1 out of 10 at most and overall he tells me that generally this is not giving him much trouble. In fact he even feels maybe a little bit better than last week. We have continue with the 4-layer compression wrap in which she tolerates very well at this point. He is continuing to utilize the [place]. 06/12/16 I think there has been some progression in the status of both of these wounds over today again covered in a gelatinous surface. Has been using Hydrofera Blue. We had used Iodoflex in the past I'm not sure if there was an issue other than changing to something that might progress towards closure faster 06/19/16; he did not tolerate the Flexeril last week secondary to pain and this was changed on Friday back to [facility] area he continues to have copious amounts of gelatinous surface slough which is think inhibiting the speed of healing this area 06/26/16 patient over the last week has utilized the Santyl to try to loosen up some of the tightly adherent slough that was noted on evaluation last week. The good news is he tells me that the medial malleoli region really does not bother him the right llateral malleoli region is more tender to palpation at this point in time especially in the central/inferior location. However it does appear that the Santyl has done his job to loosen up the adherent slough at this point in time. Fortunately he has no interval signs or symptoms of infection locally or systemically no  purulent discharge noted. 07/03/16 at this point in time today patient's wounds appear to be significantly improved over the right medial and lateral malleolus locations. He has much less tenderness at this point in time and the wounds appear clean her although there is still adherent slough this is sufficiently improved over what I saw last week. I still see no evidence of local infection. 07/10/16; continued gradual improvement in the right medial and lateral malleolus locations. The lateral is more substantial wound now divided into 2 by a rim of normal epithelialization. Both areas have adherent surface slough and nonviable subcutaneous tissue 07-17-16- He continues to have progress to his right medial  and lateral malleolus ulcers. He denies any complaints of pain or intolerance to compression. Both ulcers are smaller in size oriented today's measurements, both are covered with a softly adherent slough. 07/24/16; medial wound is smaller, lateral about the same although surface looks better. Still using Hydrofera Blue 07/31/16; arrives today complaining of pain in the lateral part of his foot. Nurse reports a lot more drainage. He has been using Hydrofera Blue. Switch to silver alginate today 08/03/2016 -- I was asked to see the patient was here for a nurse visit today. I understand he had a lot of pain in his right lower extremity and was having blisters on his right foot which have not been there before. Though he started on doxycycline he does not have blisters elsewhere on his body. I do not believe this is a drug allergy. also mentioned that there was a copious purulent discharge from the wound and clinically there is no evidence of cellulitis. 08/07/16; I note that the patient came in for his nurse check on Friday apparently with blisters on his toes on the right than a lot of swelling in his forefoot. He continued on the doxycycline that I had prescribed on 12/8. A culture was done of the  lateral wound that showed a combination of a few Proteus and Pseudomonas. Doxycycline might of covered the Proteus but would be unlikely to cover the Pseudomonas. He is on Coumadin. He arrives in the clinic today feeling a lot better states the pain is a lot better but nothing specific really was done other than to rewrap the foot also noted that he had arterial studies ordered in August although these were never done. It is reasonable to go ahead and reorder these. 08/14/16; generally arrives in a better state today in terms of the wounds he has taken cefdinir for one week. Our intake nurse reports copious amounts of drainage but the patient is complaining of much less pain. He is not had his PT and INR checked and I've asked him to do this today or tomorrow. 08/24/2016 -- patient arrives today after 10 days and said he had a stomach upset. His arterial study was done and I have reviewed this report and find it to be within normal limits. However I did not note any venous duplex studies for reflux, and Dr. Leanord Hawking may have ordered these in the past but I will leave it to him to decide if he needs these. The patient has finished his course of cefdinir. 08/28/16; patient arrives today again with copious amounts of thick really green drainage for our intake nurse. He states he has a very tender spot at the superior part of the lateral wound. Wounds are larger 09/04/16; no real change in the condition of this patient's wound still copious amounts of surface slough. Started him on Iodoflex last week he is completing another course of Cefdinir or which I think was done empirically. His arterial study showed ABIs were 1.1 on the right 1.5 on the left. He did have a slightly reduced ABI in the right the left one was not obtained. Had calcification of the right posterior tibial artery. The interpretation was no segmental stenosis. His waveforms were triphasic. [name], [name] ([id number]) His reflux  studies are later this month. Depending on this I'll send him for a vascular consultation, he may need to see plastic surgery as I believe he is had plastic surgery on this foot in the past. He had an injury to the foot in  the 1980s. 1/16 /18 right lateral greater than right medial ankle wounds on the right in the setting of previous skin grafting. 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 He does not have arterial issues. Each week he comes in with the same adherent surface slough although there was less of this today 09/18/16; right lateral greater than right medial lower extremity wounds in the setting of previous skin grafting and trauma. He has least to vein laser ablation scheduled for February 2 for venous reflux. He does not have significant arterial disease. Problem has been very difficult to handle surface slough/necrotic tissue. Recently using Iodoflex for this with some, albeit slow improvement 09/25/16; right lateral greater than right medial lower extremity venous wounds in the setting of previous skin grafting. He is going for ablation surgery on February 2 after this he'll come back here for rewrap. He has been using Iodoflex as the primary dressing. 10/02/16; right lateral greater than right medial lower extremity wounds in the setting of previous skin grafting. He had his ablation surgery last week, I don't have a report. He tolerated this well. Came in with a thigh-high Unna boots on Friday. We have been using Iodoflex as the primary dressing. His measurements are improving 10/09/16; continues to make nice aggressive in terms of the wounds on his lateral and medial right ankle in the setting of previous skin grafting. Yesterday he noticed drainage at one of his surgical sites from his venous ablation on the right calf. He took off the bandage over this area felt a "popping" sensation and a reddish-brown  drainage. He is not complaining of any pain 10/16/16; he continues to make nice progression in terms of the wounds on the lateral and medial malleolus. Both smaller using Iodoflex. He had a surgical area in his posterior mid calf we have been using iodoform. All the wounds are down and dimensions 10/23/16; the patient arrives today with no complaints. He states the Iodoflex is a bit uncomfortable. He is not systemically unwell. We have been using Iodoflex to the lateral right ankle and the medial and Aquacel Ag to the reflux surgical wound on the posterior right calf. All of these wounds are doing well 10/30/16; patient states he has no pain no systemic symptoms. I changed him to [facility] last week. Although the wounds are doing well 11/06/16; patient reports no pain or systemic symptoms. We continue with Hydrofera Blue. Both wound areas on the medial and lateral ankle appear to be doing well with improvement and dimensions and improvement in the wound bed. 11/13/16; patient's dimensions continued to improve. We continue with Hydrofera Blue on the medial and lateral side. Appear to be doing well with healthy granulation and advancing epithelialization 11/20/16; patient's dimensions improving laterally by about half a centimeter in length. Otherwise no change on the medial side. Using [facility] 12/04/16; no major change in patient's wound dimensions. Intake nurse reports more drainage. 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 He is voicing no complaints or concerns. He has been tolerating his twice weekly compression therapy changes 12/11/16 Patient complains of pain and increased drainage.. wants hydrofera blue 12/18/16 improvement. Sorbact 12/25/16; medial wound is smaller, lateral measures the same. Still on sorbact 01/01/17; medial wound continues to be smaller, lateral measures about  the same however there  is clearly advancing epithelialization here as well in fact I think the wound will ultimately divided into 2 open areas 01/08/17; unfortunately today fairly significant regression in several areas. Surface of the lateral wound covered again in adherent necrotic material which is difficult to debridement. He has significant surrounding skin maceration. The expanding area of tissue epithelialization in the middle of the wound that was encouraging last week appears to be smaller. There is no surrounding tenderness. The area on the medial leg also did not seem to be as healthy as last week, the reason for this regression this week is not totally clear. We have been using Sorbact for the last 4 weeks. We'll switched of polymen AG which we will order via home medical supply. If there is a problem with this would switch back to Iodoflex 01/15/18; drainage,odor. No change. Switched to polymen last week 01/22/17; still continuous drainage. Culture I did last week showed a few Proteus pansensitive. I did this culture because of drainage. Put him on Augmentin which she has been taking since Saturday however he is developed 4-5 liquid bowel movements. He is also on Coumadin. Beyond this wound is not changed at all, still nonviable necrotic surface material which I debrided reveals healthy granulation line 01/29/17; still copious amounts of drainage reported by her intake nurse. Wound measuring slightly smaller. Currently fact on Iodoflex although I'm looking forward to changing back to perhaps [facility] or polymen AG 02/05/17; still large amounts of drainage and presenting with really large amounts of adherent slough and necrotic material over the remaining open area of the wound. We have been using Iodoflex but with little improvement in the surface. Change to [facility] 02/12/17; still large amount of drainage. Much less adherent slough however. Started [employer]	[place] last  week [name], [name] ([id number]) 02/19/17; drainage is better this week. Much less adherent slough. Perhaps some improvement in dimensions. Using [facility] 02/26/17; severe venous insufficiency wounds on the right lateral and right medial leg. Drainage is some better and slough is less adherent we've been using Hydrofera Blue 03/05/17 on evaluation today patient appears to be doing well. His wounds have been decreasing in size and overall he is pleased with how this is progressing. We are awaiting approval for the epigraph which has previously been recommended in the meantime the [facility] Dressing is to be doing very well for him. 03/12/17; wound dimensions are smaller still using Hydrofera Blue. Comes in on Fridays for a dressing change. 03/19/17; wound dimensions continued to contract. Healing of this wound is complicated by continuous significant drainage as well as recurrent buildup of necrotic surface material. We looked into Apligraf and he has a $290 co-pay per application but truthfully I think the drainage as well as the nonviable surface would preclude use of Apligraf there are any other skin substitute at this point therefore the continued plan will be debridement each clinic visit, 2 times a week dressing changes and continued use of Hydrofera Blue. improvement has been very slow but sustained 03/26/17 perhaps slight improvements in peripheral epithelialization is especially inferiorly. Still with large amount of drainage and tightly adherent necrotic surface on arrival. Along with the intake nurse I reviewed previous treatment. He worsened on Iodoflex and had a 4 week trial of sorbact, Polymen AG and a long courses of Aquacel Ag. He is not a candidate for advanced treatment options for many reasons 04/09/17 on evaluation today patient's right lower extremity wounds appear to be doing a little better. Fortunately  he has no significant discomfort and has been tolerating the  dressing changes including the wraps without complication. With that being said he really does not have swelling anymore compared to what he has had in the past following his vascular intervention. He wonders if potentially we could attempt avoiding the rats to see if he could cleanse the wound in between to try to prevent some of the fiber and its buildup that occurs in the interim between when we see him week to week. No fevers, chills, nausea, or vomiting noted at this time. 04/16/17; noted that the staff made a choice last week not to put him in compression. The patient is changing his dressing at home using [facility] and changing every second day. His wife is washing the wound with saline. He is using kerlix. Surprisingly he has not developed a lot of edema. This choice was made because of the degree of fluid retention and maceration even with changing his dressing twice a week 04/23/17; absolutely no change. Using Hydrofera Blue. Recurrent tightly adherent nonviable surface material. This is been refractory to Iodoflex, sorbact and now Hydrofera Blue. More recently he has been changing his own dressings at home, cleansing the wound in the shower. He has not developed lower extremity edema 04/30/17; if anything the wound is larger still in adherent surface although it debrided easily today. I've been using Mehta honey for 1 week and I'd like to try and do it for a second week this see if we can get some form of viable surface here. 05/07/17; patient arrives with copious amounts of drainage and some pain in the superior part of the wound. He has not been systemically unwell. He's been changing this daily at home 05/14/17; patient arrives today complaining of less drainage and less pain. Dimensions slightly better. Surface culture I did of this last week grew MSSA I put him on dicloxacillin for 10 days. Patient requesting a further prescription since he feels so much better. He did not obtain the  [facility] for reasons that aren't clear, he has been using Hydrofera Blue 05/21/17; perhaps some less drainage and less pain. He is completing another week of doxycycline. Unfortunately there is no change in the wound measurements are appearance still tightly adherent necrotic surface material that is really defied treatment. We have been using Hydrofera Blue most recently. He did not manage to get Anasept. He was told it was prescription at [facility] 05/29/17; absolutely no improvement in these wound areas. He had remote plastic surgery with who was done at [facility] in [place] surrounding this area. This was a traumatic wound with extensive plastic surgery/skin grafting at that time. I switched him to sorbact last week to see if he can do anything about the recurrent necrotic surface and drainage. This is largely been refractory to Iodoflex/Hydrofera Blue/alginates. I believe we tried Elby Showers and more recently Medi honey l however the drainage is really too excessive 06/04/17; patient went to see Dr. Ulice Bold. Per the patient she ordered him a compression stocking. Continued the Anasept gel and sorbact was already on. No major improvement in the condition of the wounds in fact the medial wound looks larger. Again per the patient she did not feel a skin graft or operative debridement was indicated The patient had previous venous ablation in February 2018. Last arterial studies were in December 2017 and were within normal limits 06/18/17; patient arrives back in clinic today using Anasept gel with sorbact. He changes this every day is  wearing his own compression stocking. The patient actually saw Dr. Leta Baptist of plastic surgery. I've reviewed her note. The appointment was on 05/30/17. The basic issue with here was that she did not feel that skin grafts for patients with underlying stasis and edema have a good long-term success rate. She also discuss skin substitutes which has been done here  as well however I have not been able to get the surface of these wounds to something that I think would support skin substitutes. This is why he felt he might need an [name], [name]. ([id number]) operative debridement more aggressively than I can do in this clinic Review of systems; is otherwise negative he feels well 07/02/17; patient has been using Anasept gel with Sorbact. He changes this every day scrubs out the wound beds. Arrives today with the wound bed looking some better. Easier debridement 07/16/17; patient has been using Anasept gel was sorbact for about a month now. The necrotic service on his wound bed is better and the drainage is now down to minimal but we've not made any improvements in the epithelialization. Change to Santyl today. Surface of the wound is still not good enough to support skin substitutes 07/30/17 on evaluation today patient appears to be doing very well in regard to his right bimalleolus wounds. He has been tolerating the treatment with the Anasept gel and sorbact. However we were going to try and switch to Santyl to be more aggressive with these wounds. This was however cost prohibitive. Therefore we will likely go back to the sorbact. Nonetheless he is not having any significant discomfort he still is forming slough over the wound location but nothing too significant. The wounds do appear to be filling in nicely. 08/13/17; I have not seen this wound in about a month. There is not much change. The fact the area medially looks larger. He has been using sorbact and Anasept for over a month now without much effect. Arrives in clinic stating that he does not want the area debrided 08/28/17; patient arrives with the areas on his right medial and right lateral ankle worse. The wounds of expanded there is erythema and still drainage. There is no pain and no tenderness. We had been using Keracel AG clearly not doing well. The patient has had previous ablations I'm going  to send him back to vascular surgery to see if anything else can be done both wound areas are larger 09/10/17 on evaluation today patient appears to be doing a little bit worse in regard to his medial and lateral malleolus ulcer's of the right lower extremity. He states that even the evening that we began utilizing the Iodoflex he started having burning. Subsequently this never really improved and he eventually discontinue the use of the Iodoflex altogether. Unfortunately he did not let us know about any of this until today. I'm unsure of any specific infection at this point although I am going to obtain a wound culture today in order to see if there's anything that could potentially be causing an infection as well. It does sound as if he's had the Iodoflex before and did not have this kind of reaction although this does sound very specific for a reaction to the Iodoflex. 09/17/17 on evaluation today patient appears to be doing significantly better compared to last week's evaluation. At that time it actually appears that he did have an infection the good news is that we have been able to get this under control with switching to the [facility]  solution he's not having as much pain and discomfort he still does have some erythema surrounding the medial aspect wound. He has been tolerating the Dakin's soaked gauze dressing which is excellent and that his wounds do not seem to have as much adherent slough. Overall I'm pleased with the progress she's made in last week. 11/05/17 on evaluation today patient appears to be doing better in regard to his right medial and lateral malleolus ulcers. He has been tolerating the dressing changes without complication. I do flight the Freada Bergeron is helping with additional granulation at this point in the wound beds do appear to be a little bit better. With that being said patient does not appear to have any evidence of significant infection at this point there is no erythema as he  has previously noted he does note that the 30 day course of antibiotics I placed him on will end tomorrow. 11/12/17 on evaluation today patient actually appears to be doing excellent in regard to his right medial and lateral malleolus ulcers. He has been tolerating the dressing changes without complication. Fortunately he has no evidence of infection at this time and overall I believe he is progressing extremely nicely he has a lot of new epithelialization noted on evaluation today. Patient likewise is very pleased with the progress even just since last week. 11/19/17 on evaluation today patient appears to be doing about the same in regard to his ulcerations. He does not have any additional breakdown although he really has not noted any significant improvement either over the past week. With that being said the more concerning thing at this time is that he is beginning to show signs of erythema surrounding both wounds which is what typically happens and then he will develop into a more overt infection. This has been the course for some time. I hope that doing the 30 day in a biotic cycle this past time would break that so that he would not have the same type of thing happen again. Nonetheless it appears that is digressing back into this infected state unfortunately. With that being said he is not having any pain currently which is good he typically does start to hurt more as this progresses. Fortunately there does not appear to be any evidence of infection systemically which is good news. 11/26/17 on evaluation today patient appears to actually be doing rather well in regard to his ulcer compared to last week. He still has your theme and noted around both the medial and lateral malleolus ulcers of the right lower extremity. He does not seem to be quite as overtly infected however. He has been tolerating the dressing changes without complication which is good news. The gentamicin cream does seem to have  been of benefit for him. With that being said he does actually have an appointment with Dr. Sampson Goon this morning to see if there's anything that would be recommended in the way of IV antibiotic [name], [name] therapy. Otherwise he still has slough noted on the surface of the wound. 12/03/17 on evaluation today patient presents for follow-up concerning his ongoing right lower extremity ulcers. He has been tolerating the dressing changes without complication he states he has not really been using gentamicin on the lateral ankle and this seems to be doing very well. For that reason I think that is probably fine to put a hold on the gentamicin he states his burns and stings when he applies it and we maybe be able to just use this  intermittently when things become more irritated. Other than that the good news is I did review his MRI which was performed on 11/28/17 and revealed that he has a negative MRI for abscess, osteomyelitis, or septic joint. Obviously these were the issues that I was concerned with the possibility of and I'm pleased to find that there is no problems in that regard. I did also review Dr. Jarrett Ables note from infectious disease he discussed performed some lab work which apparently was done and then subsequently was going to consider the possibility of Keflex long-term for prevention of infection although this has not been prescribed as of yet. 12/09/17 on evaluation today patient appears to be doing a little bit worse in regard to his right medial and lateral malleolus ulcers. Fortunately the wound does not seem to be significantly worse although he does have a lot more drainage especially the lateral aspect he is having a lot more pain than what he has noted more recently. Fortunately there does not appear to be any evidence of systemic infection which is good news. Again he has seen Dr. Sampson Goon but he has not been placed on the potential Keflex which was  recommended as a possibility at least yet. 12/17/17 unfortunately on evaluation today the patient's wound appears to be doing much worse. He has been performing the dressing changes as directed. Upon a review of notes in epic it does appear that Dr. Jarrett Ables office specifically sending Keflex for the patient on the 16th which was last Tuesday. With that being said the patient states that though this was sent into [facility] that [facility] stated they never received it and subsequently the patient never took anything. He tells me he has been in bed since Friday due to the increased pain and overall general malaise. No fevers, chills, nausea, or vomiting noted at this time. 02/25/18 on evaluation today patient appears to be doing rather well in regard to his bilateral right malleolus ulcers. He has been tolerating the dressing changes without complication. Currently there does not appear to be any evidence of infection. Overall I'm very pleased with the progress that seems to been made up to this point. 03/11/18 on evaluation today patient actually appears to be doing very well in regard to his right ankle ulcers. He's been tolerating the dressing changes without complication. Fortunately there does not appear to be any evidence of sniffing infection I do believe that the gentamicin cream continues to be of benefit for him. Fortunately he is showing signs of healing in which time I see him. He also tells me he's having no pain. 03/25/18 on evaluation today patient actually appears to show signs of improvement especially in regard to the right lateral ankle ulcer. He has been tolerating the dressing changes without complication. In general I'm very pleased with the progress that has been made up to this point. 04/08/18- He presents in follow up evaluation for right bimalleolar wounds; there is essentially no change. He admits to removing the dressing at night to allow the wounds to be "air out". We will  continue with same treatment plan and he will follow up in two weeks 04/22/18 on evaluation today patient seems very frustrated with this situation currently as far as the ulcer on his right medial and lateral malleolus is concerned. He states this has been going on a very long time and obviously he does have a lot of bills as well is far as wound care is concerned. With that being said he tells  me that he's been tolerating the dressing changes well although he did clarify that what he is doing is putting the medicine on in the morning after shower and then subsequently he takes the dressing off at night leaving it open to air. I previously asked him about this and I was not aware that he was doing this. Nonetheless that may be slowing things down a bit at this point. Fortunately there does not appear to be any evidence of severe infection although he does have some evidence of your theme is surrounding the wound bed. He is still using the gentamicin cream. 05/06/18 on evaluation today patient actually presents for evaluation of his right medial malleolus ulcers. He's been tolerating the dressing changes without complication. With that being said it does appear that he is doing better compared to last evaluation. He's not having the pain like he did previous. He also states that blisters that he had coming up when I saw him last did resolve and ended up doing okay he did go to the ER they gave him some cream to put on it he's not sure what the name of the cream was. He was down in [facility] when he called me and I spoke with him previous. Nonetheless he states that they told him to put the medicine on his our due list. With that being said I'm not really 100% sure that the medicine had anything to do with this due to the fact that again he was having the blisters when he came into the office which is why we actually put them on the anabiotic I was concerned about him having an infection which was  causing the blistering and increased pain in his extremity. Nonetheless this is something that we can definitely be cautious of in the future it was Bactrim Fee, Lemar C. ([id number]) that we had him on. Overall I'm glad he is doing better. 05/20/18 on evaluation today patient actually appears to be doing fairly well in regard to his ulcer on the right lateral and medial malleolus locations. Both seem to be doing decently well he does have slough building up on the surface of the wound. He has been using the Hibiclense as I instructed him. Overall there's no evidence of significant infection which is good news. 06/02/18 on evaluation today patient actually appears to be doing excellent in regard to his right medial and lateral malleolus ulcers. He has been tolerating the dressing changes without complication. There does not appear to be any evidence of infection at this time which is great news. In general I feel like he has made great progress in the past several weeks. He has good granulation noted over areas that have not had good granulation previously and epithelialization even more so in several areas. Both wounds measured smaller. 06/17/18 an evaluation today patient actually appears to be doing excellent in regard to his right lateral and medial malleolus ulcers. He has been tolerating the dressing changes without complication fortunately the Hydrofera Blue Dressing to be doing excellent for him. He's actually making progress each time I see him. 07/08/18 on evaluation today patient's wounds on the right medial and lateral malleolus or region show signs of improvement currently. He has been tolerating the dressing changes without complication at this time. He does feel like the [facility] Dressing has been of benefit for him. No fevers, chills, nausea, or vomiting noted at this time. 07/22/18 and evaluation today patient actually appears to be doing excellent in  regard to his right  medial and lateral malleolus ulcers. He's been tolerating the dressing changes without complication the Hydrofera Blue Dressing years be excellent for him. Overall I'm extremely pleased in this regard. 08/05/18 on evaluation today patient's right lateral and medial malleolus ulcers appear to be doing well at this point. There does not appear to be any evidence of infection which is good news. Overall he does have some Slough noted which is going to require sharp debridement but other than this I see no current issues 08/19/18 on evaluation today patient actually appears to be doing better yet again in regard to his lower [place] ulcer on the right medial and lateral malleolus regions. He is having no pain and no significant signs of infection which is great news. Overall things seem to be progressing quite nicely which is great news. No fevers, chills, nausea, or vomiting noted at this time. 09/09/18 on evaluation today patient appears to be doing very well in regard to his medial and lateral ankle ulcers. He's been tolerating the dressing changes without complication. Fortunately there is no evidence of infection at this time which is good news. No fevers, chills, nausea, or vomiting noted at this time. Patient History Information obtained from Patient. Social History Former smoker - 10 years ago, Marital Status - Married, Alcohol Use - Moderate, Drug Use - No History, Caffeine Use - Never. Medical And Surgical History Notes Constitutional Symptoms (General Health) Vein Filter (groin area); CODA; H/O Blood Clots; pulmonary hypertensive arterial disease Review of Systems (ROS) Constitutional Symptoms (General Health) Denies complaints or symptoms of Fever, Chills. Respiratory The patient has no complaints or symptoms. Cardiovascular The patient has no complaints or symptoms. Psychiatric The patient has no complaints or symptoms. LONI, ABDON  ([id number]) Objective Constitutional Well-nourished and well-hydrated in no acute distress. Vitals Time Taken: 8:04 AM, Height: 76 in, Weight: 238 lbs, BMI: 29, Temperature: 98.2 F, Pulse: 84 bpm, Respiratory Rate: 16 breaths/min, Blood Pressure: 139/69 mmHg. Respiratory normal breathing without difficulty. clear to auscultation bilaterally. Cardiovascular regular rate and rhythm with normal S1, S2. Psychiatric this patient is able to make decisions and demonstrates good insight into disease process. Alert and Oriented x 3. pleasant and cooperative. General Notes: Patient's wound bed currently did require sharp debridement at this point. I was able to clean away the necrotic material from the surface of the wound which is saline and gauze and therefore sharp debridement ended up not being absolutely necessary. He really did not want me to "cut on it" anyway. Overall I feel like things are looking well. Integumentary (Hair, Skin) Wound #1 status is Open. Original cause of wound was Gradually Appeared. The wound is located on the Right,Lateral Malleolus. The wound measures 5.2cm length x 3.8cm width x 0.2cm depth; 15.519cm^2 area and 3.104cm^3 volume. Wound #2 status is Open. Original cause of wound was Gradually Appeared. The wound is located on the Right,Medial Malleolus. The wound measures 3cm length x 1.8cm width x 0.2cm depth; 4.241cm^2 area and 0.848cm^3 volume. 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 ([id number]) Wound Cleansing: Wound #1 Right,Lateral Malleolus: Clean wound with Normal Saline. May Shower, gently pat wound dry prior  to applying new dressing. - wash with hibiclens daily and leave on 5  mins Wound #2 Right,Medial Malleolus: Clean wound with Normal Saline. May Shower, gently pat wound dry prior to applying new dressing. - wash with hibiclens and leave on 5 mins Anesthetic (add to Medication List): Wound #1 Right,Lateral Malleolus: Topical Lidocaine 4% cream applied to wound bed prior to debridement (In Clinic Only). Wound #2 Right,Medial Malleolus: Topical Lidocaine 4% cream applied to wound bed prior to debridement (In Clinic Only). Primary Wound Dressing: Wound #1 Right,Lateral Malleolus: Hydrafera Blue Ready Transfer Wound #2 Right,Medial Malleolus: Hydrafera Blue Ready Transfer Secondary Dressing: Wound #1 Right,Lateral Malleolus: Dry Gauze Conform/Kerlix Wound #2 Right,Medial Malleolus: Dry Gauze Conform/Kerlix Dressing Change Frequency: Wound #1 Right,Lateral Malleolus: Change dressing every other day. Wound #2 Right,Medial Malleolus: Change dressing every other day. Follow-up Appointments: Wound #1 Right,Lateral Malleolus: Return Appointment in 2 weeks. Wound #2 Right,Medial Malleolus: Return Appointment in 2 weeks. Edema Control: Wound #1 Right,Lateral Malleolus: Elevate legs to the level of the heart and pump ankles as often as possible Wound #2 Right,Medial Malleolus: Elevate legs to the level of the heart and pump ankles as often as possible Additional Orders / Instructions: Wound #1 Right,Lateral Malleolus: Increase protein intake. Wound #2 Right,Medial Malleolus: Increase protein intake. My suggestion currently is gonna be that we go ahead and continue with the Current wound care measures since his wounds do seem to be doing well the patient is in agreement with the plan. We will subsequently see were things stand at follow-up. Please see above for specific wound care orders. We will see patient for re-evaluation in 1 week(s) here in the clinic. If anything worsens or changes  patient will contact our office for additional recommendations. Electronic Signature(s) Signed: 09/09/2018 5:17:02 PM By: Lorene Dy, Stewardson ([id number]) Entered By: Lenda Kelp on 09/09/2018 08:30:55 [name] ([id number]) -------------------------------------------------------------------------------- ROS/PFSH Details Patient Name: [name] Date of Service: 09/09/2018 8:00 AM Medical Record Number: [id number] Patient Account Number: [id number] Date of Birth/Sex: 1945-12-07 (73 y.o. M) Treating RN: Curtis Sites Primary Care Provider: [name] Other Clinician: Referring Provider: [name] Treating Provider/Extender: STONE III, HOYT Weeks in Treatment: 138 Information Obtained From Patient Wound History Do you currently have one or more open woundso Yes How many open wounds do you currently haveo 1 Approximately how long have you had your woundso 5 months How have you been treating your wound(s) until nowo saline, dressing Has your wound(s) ever healed and then re-openedo Yes Have you had any lab work done in the past montho No Have you tested positive for an antibiotic resistant organism (MRSA, VRE)o No Have you tested positive for osteomyelitis (bone infection)o No Have you had any tests for circulation on your legso No Constitutional Symptoms (General Health) Complaints and Symptoms: Negative for: Fever; Chills Medical History: Past Medical History Notes: Vein Filter (groin area); CODA; H/O Blood Clots; pulmonary hypertensive arterial disease Eyes Medical History: Positive for: Cataracts - removed Negative for: Glaucoma; Optic Neuritis Ear/Nose/Mouth/Throat Medical History: Negative for: Chronic sinus problems/congestion Hematologic/Lymphatic Medical History: Negative for: Anemia; Hemophilia; Human Immunodeficiency Virus; Lymphedema; Sickle Cell Disease Respiratory Complaints and Symptoms: No Complaints or  Symptoms Medical History: Positive for: Chronic Obstructive Pulmonary Disease (COPD) Negative for: Aspiration; Asthma; Pneumothorax; Sleep Apnea; Tuberculosis Cardiovascular [name], [name]. ([id number]) Complaints and Symptoms: No Complaints or Symptoms Medical History: Negative for: Angina; Arrhythmia; Congestive Heart Failure; Coronary Artery Disease; Deep Vein Thrombosis; Hypertension; Hypotension; Myocardial Infarction; Peripheral Arterial Disease; Peripheral Venous Disease; Phlebitis; Vasculitis Gastrointestinal Medical History: Negative for: Cirrhosis ; Colitis;  Crohnos; Hepatitis A; Hepatitis B; Hepatitis C Endocrine Medical History: Negative for: Type I Diabetes; Type II Diabetes Genitourinary Medical History: Negative for: End Stage Renal Disease Immunological Medical History: Negative for: Lupus Erythematosus; Raynaudos Integumentary (Skin) Medical History: Negative for: History of Burn; History of pressure wounds Musculoskeletal Medical History: Positive for: Osteoarthritis Negative for: Gout; Rheumatoid Arthritis; Osteomyelitis Neurologic Medical History: Negative for: Dementia; Neuropathy; Quadriplegia; Paraplegia; Seizure Disorder Oncologic Medical History: Negative for: Received Chemotherapy; Received Radiation Psychiatric Complaints and Symptoms: No Complaints or Symptoms Medical History: Negative for: Anorexia/bulimia; Confinement Anxiety HBO Extended History Items [name], [name]. ([id number]) Eyes: Cataracts Immunizations Pneumococcal Vaccine: Received Pneumococcal Vaccination: No Implantable Devices Family and Social History Former smoker - 10 years ago; Marital Status - Married; Alcohol Use: Moderate; Drug Use: No History; Caffeine Use: Never; Advanced Directives: No; Patient does not want information on Advanced Directives; Living Will: No; Medical Power of Attorney: No Physician Affirmation I have reviewed and agree with the above  information. Electronic Signature(s) Signed: 09/09/2018 5:17:02 PM By: Lenda Kelp PA-C Signed: 09/09/2018 5:22:36 PM By: Curtis Sites Entered By: Lenda Kelp on 09/09/2018 08:30:24 [name] ([id number]) -------------------------------------------------------------------------------- SuperBill Details Patient Name: [name] Date of Service: 09/09/2018 Medical Record Number: [id number] Patient Account Number: [id number] Date of Birth/Sex: [birth year] (73 y.o. 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, sequela T81.31XS Disruption of external operation (surgical) wound, not elsewhere classified, sequela Facility Procedures CPT4 Code: 08657846 Description: 96295 - WOUND CARE VISIT-LEV 5 EST PT Modifier: Quantity: 1 Physician Procedures CPT4: Description Modifier Quantity Code [id number] - WC PHYS LEVEL 4 - EST PT 1 ICD-10 Diagnosis Description I87.331 Chronic venous hypertension (idiopathic) with ulcer and inflammation of right lower extremity L97.312 Non-pressure chronic ulcer of  right ankle with fat layer exposed T85.613S 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

## 2018-09-10 NOTE — Progress Notes (Signed)
JAQUILLE, STICKLE ([id number]) Visit Report for 09/09/2018 Arrival Information Details Patient Name: [name], [name]. Date of Service: 09/09/2018 8:00 AM Medical Record Number: [id number] Patient Account Number: [id number] Date of Birth/Sex: Mar 21, 1946 (73 y.o. M) Treating RN: Huel Coventry Primary Care Anshul Meddings: [name] Other Clinician: Referring Tecora Eustache: [name] Treating Chenita Ruda/Extender: Linwood Dibbles, HOYT Weeks in Treatment: 138 Visit Information History Since Last Visit Added or deleted any medications: No Patient Arrived: Ambulatory Any new allergies or adverse reactions: No Arrival Time: 08:03 Had a fall or experienced change in No Accompanied By: self activities of daily living that may affect Transfer Assistance: None risk of falls: Patient Identification Verified: Yes Signs or symptoms of abuse/neglect since last visito No Secondary Verification Process Yes Hospitalized since last visit: No Completed: Implantable device outside of the clinic excluding No Patient Requires Transmission-Based No cellular tissue based products placed in the center Precautions: since last visit: Patient Has Alerts: Yes Has Dressing in Place as Prescribed: Yes Patient Alerts: Patient on Blood Pain Present Now: No Thinner Electronic Signature(s) Signed: 09/09/2018 5:06:25 PM By: Elliot Gurney, BSN, RN, CWS, Kim RN, BSN Entered By: Elliot Gurney, BSN, RN, CWS, Kim on 09/09/2018 08:04:06 [name] ([id number]) -------------------------------------------------------------------------------- Clinic Level of Care Assessment Details Patient Name: [name] Date of Service: 09/09/2018 8:00 AM Medical Record Number: [id number] Patient Account Number: [id number] Date of Birth/Sex: April 21, 1946 (72 y.o. M) Treating RN: Curtis Sites Primary Care Merve Hotard: [name] Other Clinician: Referring Girtrude Enslin: [name] Treating Abbygayle Helfand/Extender: Linwood Dibbles, HOYT Weeks in Treatment:  138 Clinic Level of Care Assessment Items TOOL 4 Quantity Score []  - Use when only an EandM is performed on FOLLOW-UP visit 0 ASSESSMENTS - Nursing Assessment / Reassessment X - Reassessment of Co-morbidities (includes updates in patient status) 1 10 X- 1 5 Reassessment of Adherence to Treatment Plan ASSESSMENTS - Wound and Skin Assessment / Reassessment []  - Simple Wound Assessment / Reassessment - one wound 0 X- 2 5 Complex Wound Assessment / Reassessment - multiple wounds []  - 0 Dermatologic / Skin Assessment (not related to wound area) ASSESSMENTS - Focused Assessment []  - Circumferential Edema Measurements - multi extremities 0 []  - 0 Nutritional Assessment / Counseling / Intervention X- 1 5 Lower Extremity Assessment (monofilament, tuning fork, pulses) []  - 0 Peripheral Arterial Disease Assessment (using hand held doppler) ASSESSMENTS - Ostomy and/or Continence Assessment and Care []  - Incontinence Assessment and Management 0 []  - 0 Ostomy Care Assessment and Management (repouching, etc.) PROCESS - Coordination of Care X - Simple Patient / Family Education for ongoing care 1 15 []  - 0 Complex (extensive) Patient / Family Education for ongoing care X- 1 10 Staff obtains Chiropractor, Records, Test Results / Process Orders []  - 0 Staff telephones HHA, Nursing Homes / Clarify orders / etc []  - 0 Routine Transfer to another Facility (non-emergent condition) []  - 0 Routine Hospital Admission (non-emergent condition) []  - 0 New Admissions / Manufacturing engineer / Ordering NPWT, Apligraf, etc. []  - 0 Emergency Hospital Admission (emergent condition) X- 1 10 Simple Discharge Coordination EINAR, KRIEG. ([id number]) []  - 0 Complex (extensive) Discharge Coordination PROCESS - Special Needs []  - Pediatric / Minor Patient Management 0 []  - 0 Isolation Patient Management []  - 0 Hearing / Language / Visual special needs []  - 0 Assessment of Community assistance  (transportation, D/C planning, etc.) []  - 0 Additional assistance / Altered mentation []  - 0 Support Surface(s) Assessment (bed, cushion, seat, etc.) INTERVENTIONS - Wound Cleansing / Measurement []  -  Simple Wound Cleansing - one wound 0 X- 22 5 Complex Wound Cleansing - multiple wounds X- 1 5 Wound Imaging (photographs - any number of wounds) []  - 0 Wound Tracing (instead of photographs) []  - 0 Simple Wound Measurement - one wound X- 2 5 Complex Wound Measurement - multiple wounds INTERVENTIONS - Wound Dressings X - Small Wound Dressing one or multiple wounds 2 10 []  - 0 Medium Wound Dressing one or multiple wounds []  - 0 Large Wound Dressing one or multiple wounds []  - 0 Application of Medications - topical []  - 0 Application of Medications - injection INTERVENTIONS - Miscellaneous []  - External ear exam 0 []  - 0 Specimen Collection (cultures, biopsies, blood, body fluids, etc.) []  - 0 Specimen(s) / Culture(s) sent or taken to Lab for analysis []  - 0 Patient Transfer (multiple staff / Nurse, adultHoyer Lift / Similar devices) []  - 0 Simple Staple / Suture removal (25 or less) []  - 0 Complex Staple / Suture removal (26 or more) []  - 0 Hypo / Hyperglycemic Management (close monitor of Blood Glucose) []  - 0 Ankle / Brachial Index (ABI) - do not check if billed separately X- 1 5 Vital Signs Profitt, Valerian C. ([id number]) Has the patient been seen at the hospital within the last three years: Yes Total Score: 215 Level Of Care: New/Established - Level 5 Electronic Signature(s) Signed: 09/09/2018 5:22:36 PM By: Curtis Sitesorthy, Joanna Entered By: Curtis Sitesorthy, Joanna on 09/09/2018 08:25:33 [name], Jaspreet C. ([id number]) -------------------------------------------------------------------------------- Encounter Discharge Information Details Patient Name: [name], Zymarion C. Date of Service: 09/09/2018 8:00 AM Medical Record Number: [id number] Patient Account Number: [id number] Date of  Birth/Sex: 10/31/1945 [age] y.o. M) Treating RN: Huel CoventryWoody, Kim Primary Care Yamili Lichtenwalner: Darreld McleanMILES, LINDA Other Clinician: Referring Sandrea Boer: Darreld McleanMILES, LINDA Treating Ria Redcay/Extender: Linwood DibblesSTONE III, HOYT Weeks in Treatment: 138 Encounter Discharge Information Items Discharge Condition: Stable Ambulatory Status: Ambulatory Discharge Destination: Home Transportation: Private Auto Accompanied By: self Schedule Follow-up Appointment: Yes Clinical Summary of Care: Electronic Signature(s) Signed: 09/09/2018 5:07:49 PM By: Elliot GurneyWoody, BSN, RN, CWS, Kim RN, BSN Entered By: Elliot GurneyWoody, BSN, RN, CWS, Kim on 09/09/2018 17:07:49 [name], Ezrael C. ([id number]) -------------------------------------------------------------------------------- Lower Extremity Assessment Details Patient Name: [name], Agustin C. Date of Service: 09/09/2018 8:00 AM Medical Record Number: [id number] Patient Account Number: [id number] Date of Birth/Sex: 10/31/1945 [age] y.o. M) Treating RN: Huel CoventryWoody, Kim Primary Care Shinika Estelle: Darreld McleanMILES, LINDA Other Clinician: Referring Shelene Krage: Darreld McleanMILES, LINDA Treating Terrion Poblano/Extender: Linwood DibblesSTONE III, HOYT Weeks in Treatment: 138 Vascular Assessment Pulses: Dorsalis Pedis Palpable: [Right:Yes] Posterior Tibial Extremity colors, hair growth, and conditions: Extremity Color: [Right:Hyperpigmented] Hair Growth on Extremity: [Right:No] Temperature of Extremity: [Right:Warm] Capillary Refill: [Right:< 3 seconds] Toe Nail Assessment Left: Right: Thick: Yes Discolored: Yes Deformed: Yes Improper Length and Hygiene: Yes Electronic Signature(s) Signed: 09/09/2018 5:06:25 PM By: Elliot GurneyWoody, BSN, RN, CWS, Kim RN, BSN Entered By: Elliot GurneyWoody, BSN, RN, CWS, Kim on 09/09/2018 08:08:57 [name], Marsel C. ([id number]) -------------------------------------------------------------------------------- Multi Wound Chart Details Patient Name: [name], Akram C. Date of Service: 09/09/2018 8:00 AM Medical Record Number: [id number] Patient  Account Number: [id number] Date of Birth/Sex: 10/31/1945 [age] y.o. M) Treating RN: Curtis Sitesorthy, Joanna Primary Care Shonda Mandarino: Darreld McleanMILES, LINDA Other Clinician: Referring Annamae Shivley: Darreld McleanMILES, LINDA Treating Rishab Stoudt/Extender: STONE III, HOYT Weeks in Treatment: 138 Vital Signs Height(in): 76 Pulse(bpm): 84 Weight(lbs): 238 Blood Pressure(mmHg): 139/69 Body Mass Index(BMI): 29 Temperature(F): 98.2 Respiratory Rate 16 (breaths/min): Photos: [1:No Photos] [2:No Photos] [N/A:N/A] Wound Location: [1:Right, Lateral Malleolus] [2:Right, Medial Malleolus] [N/A:N/A] Wounding Event: [1:Gradually Appeared] [2:Gradually Appeared] [N/A:N/A] Primary Etiology: [1:Venous Leg Ulcer] [2:Venous Leg  Ulcer] [N/A:N/A] Date Acquired: [1:08/11/2015] [2:01/30/2016] [N/A:N/A] Weeks of Treatment: [1:138] [2:136] [N/A:N/A] Wound Status: [1:Open] [2:Open] [N/A:N/A] Clustered Wound: [1:No] [2:Yes] [N/A:N/A] Measurements L x W x D [1:5.2x3.8x0.2] [2:3x1.8x0.2] [N/A:N/A] (cm) Area (cm) : [1:15.519] [2:4.241] [N/A:N/A] Volume (cm) : [1:3.104] [2:0.848] [N/A:N/A] % Reduction in Area: [1:10.20%] [2:63.30%] [N/A:N/A] % Reduction in Volume: [1:40.10%] [2:26.60%] [N/A:N/A] Classification: [1:Full Thickness Without Exposed Support Structures] [2:Full Thickness Without Exposed Support Structures] [N/A:N/A] Periwound Skin Texture: [1:No Abnormalities Noted] [2:No Abnormalities Noted] [N/A:N/A] Periwound Skin Moisture: [1:No Abnormalities Noted] [2:No Abnormalities Noted] [N/A:N/A] Periwound Skin Color: [1:No Abnormalities Noted No] [2:No Abnormalities Noted No] [N/A:N/A N/A] Treatment Notes Electronic Signature(s) Signed: 09/09/2018 5:22:36 PM By: Curtis Sites Entered By: Curtis Sites on 09/09/2018 08:22:26 [name] ([id number]) -------------------------------------------------------------------------------- Multi-Disciplinary Care Plan Details Patient Name: [name] Date of Service: 09/09/2018 8:00  AM Medical Record Number: [id number] Patient Account Number: [id number] Date of Birth/Sex: Aug 08, 1946 (73 y.o. M) Treating RN: Curtis Sites Primary Care Danton Palmateer: [name] Other Clinician: Referring Eduardo Honor: [name] Treating Lauren Modisette/Extender: STONE III, HOYT Weeks in Treatment: 138 Active Inactive Venous Leg Ulcer Nursing Diagnoses: Knowledge deficit related to disease process and management Goals: Patient will maintain optimal edema control Date Initiated: 04/03/2016 Target Resolution Date: 11/24/2016 Goal Status: Active Interventions: Compression as ordered Treatment Activities: Therapeutic compression applied : 04/03/2016 Notes: Wound/Skin Impairment Nursing Diagnoses: Impaired tissue integrity Goals: Ulcer/skin breakdown will heal within 14 weeks Date Initiated: 01/17/2016 Target Resolution Date: 11/24/2016 Goal Status: Active Interventions: Assess ulceration(s) every visit Notes: Electronic Signature(s) Signed: 09/09/2018 5:22:36 PM By: Curtis Sites Entered By: Curtis Sites on 09/09/2018 08:22:10 [name] ([id number]) -------------------------------------------------------------------------------- Pain Assessment Details Patient Name: [name] Date of Service: 09/09/2018 8:00 AM Medical Record Number: [id number] Patient Account Number: [id number] Date of Birth/Sex: 08-03-1946 (73 y.o. M) Treating RN: Huel Coventry Primary Care Peta Peachey: [name] Other Clinician: Referring Auna Mikkelsen: [name] Treating Ariyel Jeangilles/Extender: Linwood Dibbles, HOYT Weeks in Treatment: 138 Active Problems Location of Pain Severity and Description of Pain Patient Has Paino No Site Locations Pain Management and Medication Current Pain Management: Electronic Signature(s) Signed: 09/09/2018 5:06:25 PM By: Elliot Gurney, BSN, RN, CWS, Kim RN, BSN Entered By: Elliot Gurney, BSN, RN, CWS, Kim on 09/09/2018 08:04:34 [name]  ([id number]) -------------------------------------------------------------------------------- Patient/Caregiver Education Details Patient Name: [name] Date of Service: 09/09/2018 8:00 AM Medical Record Number: [id number] Patient Account Number: [id number] Date of Birth/Gender: [birth year] (73 y.o. M) Treating RN: Curtis Sites Primary Care Physician: [name] Other Clinician: Referring Physician: [name] Treating Physician/Extender: Skeet Simmer in Treatment: 138 Education Assessment Education Provided To: Patient Education Topics Provided Wound/Skin Impairment: Handouts: Other: wound care as ordered Methods: Demonstration, Explain/Verbal Responses: State content correctly Electronic Signature(s) Signed: 09/09/2018 5:22:36 PM By: Curtis Sites Entered By: Curtis Sites on 09/09/2018 08:24:18 [name] ([id number]) -------------------------------------------------------------------------------- Wound Assessment Details Patient Name: [name] Date of Service: 09/09/2018 8:00 AM Medical Record Number: [id number] Patient Account Number: [id number] Date of Birth/Sex: 09/27/1945 (73 y.o. M) Treating RN: Huel Coventry Primary Care Zia Kanner: [name] Other Clinician: Referring Rasul Decola: [name] Treating Hisayo Delossantos/Extender: STONE III, HOYT Weeks in Treatment: 138 Wound Status Wound Number: 1 Primary Venous Leg Ulcer Etiology: Wound Location: Right Malleolus - Lateral Wound Open Wounding Event: Gradually Appeared Status: Date Acquired: 08/11/2015 Comorbid Cataracts, Chronic Obstructive Pulmonary Weeks Of Treatment: 138 History: Disease (COPD), Osteoarthritis Clustered Wound: No Photos Wound Measurements Length: (cm) 5.2 Width: (cm) 3.8 Depth: (cm) 0.2 Area: (cm) 15.519 Volume: (cm) 3.104 % Reduction in  Area: 10.2% % Reduction in Volume: 40.1% Epithelialization: None Tunneling: No Undermining: No Wound  Description Full Thickness Without Exposed Support Foul Odo Classification: Structures Slough/F Wound Margin: Flat and Intact Exudate Medium Amount: Exudate Type: Serous Exudate Color: amber r After Cleansing: No ibrino No Wound Bed Granulation Amount: Medium (34-66%) Exposed Structure Granulation Quality: Red Fascia Exposed: No Necrotic Amount: Medium (34-66%) Fat Layer (Subcutaneous Tissue) Exposed: Yes Necrotic Quality: Adherent Slough Tendon Exposed: No Muscle Exposed: No Joint Exposed: No Bone Exposed: No Periwound Skin Texture Anna, Robey C. ([id number]) Texture Color No Abnormalities Noted: No No Abnormalities Noted: No Callus: No Atrophie Blanche: No Crepitus: No Cyanosis: No Excoriation: No Ecchymosis: No Induration: No Erythema: No Rash: No Hemosiderin Staining: No Scarring: Yes Mottled: No Pallor: No Moisture Rubor: No No Abnormalities Noted: No Dry / Scaly: No Temperature / Pain Maceration: No Temperature: No Abnormality Tenderness on Palpation: Yes Wound Preparation Ulcer Cleansing: Rinsed/Irrigated with Saline Topical Anesthetic Applied: Other: lidocaine 4%, Treatment Notes Wound #1 (Right, Lateral Malleolus) Notes Gentamycin, hydrofera blue, gauze and conform Electronic Signature(s) Signed: 09/09/2018 5:14:12 PM By: Curtis Sitesorthy, Joanna Signed: 09/09/2018 6:10:12 PM By: Elliot GurneyWoody, BSN, RN, CWS, Kim RN, BSN Previous Signature: 09/09/2018 5:06:25 PM Version By: Elliot GurneyWoody, BSN, RN, CWS, Kim RN, BSN Entered By: Curtis Sitesorthy, Joanna on 09/09/2018 17:14:11 [name], Raven C. ([id number]) -------------------------------------------------------------------------------- Wound Assessment Details Patient Name: [name], Cillian C. Date of Service: 09/09/2018 8:00 AM Medical Record Number: [id number] Patient Account Number: [id number] Date of Birth/Sex: 15-May-1946 [age] y.o. M) Treating RN: Huel CoventryWoody, Kim Primary Care Azaan Leask: Darreld McleanMILES, LINDA Other Clinician: Referring  Aedin Jeansonne: Darreld McleanMILES, LINDA Treating Marquee Fuchs/Extender: STONE III, HOYT Weeks in Treatment: 138 Wound Status Wound Number: 2 Primary Venous Leg Ulcer Etiology: Wound Location: Right Malleolus - Medial Wound Open Wounding Event: Gradually Appeared Status: Date Acquired: 01/30/2016 Comorbid Cataracts, Chronic Obstructive Pulmonary Weeks Of Treatment: 136 History: Disease (COPD), Osteoarthritis Clustered Wound: Yes Photos Wound Measurements Length: (cm) 3 Width: (cm) 1.8 Depth: (cm) 0.2 Area: (cm) 4.241 Volume: (cm) 0.848 % Reduction in Area: 63.3% % Reduction in Volume: 26.6% Epithelialization: None Tunneling: No Undermining: No Wound Description Full Thickness Without Exposed Support Foul Odo Classification: Structures Slough/F Wound Margin: Flat and Intact Exudate Medium Amount: Exudate Type: Serous Exudate Color: amber r After Cleansing: No ibrino Yes Wound Bed Granulation Amount: Medium (34-66%) Exposed Structure Granulation Quality: Red Fascia Exposed: No Necrotic Amount: Medium (34-66%) Fat Layer (Subcutaneous Tissue) Exposed: Yes Necrotic Quality: Adherent Slough Tendon Exposed: No Muscle Exposed: No Joint Exposed: No Bone Exposed: No Periwound Skin Texture Snelson, Miles C. ([id number]) Texture Color No Abnormalities Noted: No No Abnormalities Noted: No Callus: No Atrophie Blanche: No Crepitus: No Cyanosis: No Excoriation: No Ecchymosis: No Induration: No Erythema: No Rash: No Hemosiderin Staining: No Scarring: Yes Mottled: No Pallor: No Moisture Rubor: No No Abnormalities Noted: No Dry / Scaly: No Temperature / Pain Maceration: No Tenderness on Palpation: Yes Wound Preparation Ulcer Cleansing: Rinsed/Irrigated with Saline Topical Anesthetic Applied: Other: lidocaine 4%, Treatment Notes Wound #2 (Right, Medial Malleolus) Notes Gentamycin, hydrofera blue, gauze and conform Electronic Signature(s) Signed: 09/09/2018 5:14:55 PM By:  Curtis Sitesorthy, Joanna Signed: 09/09/2018 6:10:12 PM By: Elliot GurneyWoody, BSN, RN, CWS, Kim RN, BSN Previous Signature: 09/09/2018 5:06:25 PM Version By: Elliot GurneyWoody, BSN, RN, CWS, Kim RN, BSN Entered By: Curtis Sitesorthy, Joanna on 09/09/2018 17:14:54 [name], Acheron C. ([id number]) -------------------------------------------------------------------------------- Vitals Details Patient Name: [name], [name] Date of Service: 09/09/2018 8:00 AM Medical Record Number: [id number] Patient Account Number: [id number] Date of Birth/Sex: 15-May-1946 [age] y.o. M) Treating RN:  Huel Coventry Primary Care Danita Proud: [name] Other Clinician: Referring Miliano Cotten: [name] Treating Demetrius Barrell/Extender: Linwood Dibbles, HOYT Weeks in Treatment: 138 Vital Signs Time Taken: 08:04 Temperature (F): 98.2 Height (in): 76 Pulse (bpm): 84 Weight (lbs): 238 Respiratory Rate (breaths/min): 16 Body Mass Index (BMI): 29 Blood Pressure (mmHg): 139/69 Reference Range: 80 - 120 mg / dl Electronic Signature(s) Signed: 09/09/2018 5:06:25 PM By: Elliot Gurney, BSN, RN, CWS, Kim RN, BSN Entered By: Elliot Gurney, BSN, RN, CWS, Kim on 09/09/2018 08:04:54

## 2018-09-23 ENCOUNTER — Encounter: Payer: Medicare HMO | Admitting: Physician Assistant

## 2018-09-23 DIAGNOSIS — E11622 Type 2 diabetes mellitus with other skin ulcer: Secondary | ICD-10-CM | POA: Diagnosis not present

## 2018-09-25 NOTE — Progress Notes (Signed)
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 close. He is not a diabetic but does have a history of blood clots and is on chronic Coumadin and also has an IVC filter  in place. Wound is quite extensive measuring  5. 4 x 4 by 0.3. They have been using some thermal wound product and sprayed that the obtained on the Internet for the last 5-6 monthsing much progress. This started as a small open wound that expanded. 01/24/16; the patient is been receiving Santyl changed daily by his wife. Continue debridement. Patient has no complaints 01/31/16; the patient arrives with irritation on the medial aspect of his ankle noticed by her intake nurse. The patient is noted pain in the area over the last day or 2. There are four new tiny wounds in this area. His co-pay for TheraSkin application is really high I think beyond her means 02/07/16; patient is improved C+S cultures MSSA completed Doxy. using iodoflex 02/15/16; patient arrived today with the wound and roughly the same condition. Extensive area on the right lateral foot and ankle. Using Iodoflex. He came in last week with a cluster of new wounds on the medial aspect of the same ankle. 02/22/16; once again the patient complains of a lot of drainage coming out of this wound. We brought him back in on Friday for a dressing change has been using Iodoflex. States his pain level is better 02/29/16; still complaining of a lot of drainage even though we are putting absorbent material over the Santyl and bringing him back on Fridays for dressing changes. He is not complaining of pain. Her intake nurse notes blistering 03/07/16: pt returns today for f/u. he admits out in rain on Saturday and soaked his right leg. he did not share with his wife and he didn't notify the [facility]. he has an odor today that is c/w pseudomonas. Wound has greenish tan slough. there is no periwound erythema, induration, or fluctuance. wound has deteriorated since previous visit. denies fever, chills, body aches or malaise. no increased pain. 03/13/16: C+S showed proteus. He has not received AB'S. Switched to RTD last week. 03/27/16 patient is been using Iodoflex.  Wound bed has improved and debridement is certainly easier 04/10/2016 -- he has been scheduled for a venous duplex study towards the end of the month 04/17/16; has been using silver alginate, states that the Iodoflex was hurting his wound and since that is been changed he has had no pain unfortunately the surface of the wound continues to be unhealthy with thick gelatinous slough and nonviable tissue. The wound will not heal like this. 04/20/2016 -- the patient was here for a nurse visit but I was asked to see the patient as the slough was quite significant and the nurse needed for clarification regarding the ointment to be used. 04/24/16; the patient's wounds on the right medial and right lateral ankle/malleolus both look a lot better today. Less adherent slough healthier tissue. Dimensions better especially medially 05/01/16; the patient's wound surface continues to improve however he continues to require debridement switch her easier each week. Continue Santyl/Metahydrin mixture Hydrofera Blue next week. Still drainage on the medial aspect according to the intake nurse 05/08/16; still using Santyl and Medihoney. Still a lot of drainage per her intake nurse. Patient has no complaints pain fever chills etc. 05/15/16 switched the Hydrofera Blue last week. Dimensions down especially in the medial right leg wound. Area on the lateral which is more substantial also looks better still requires debridement 05/22/16; we have been using Hydrofera Blue. 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  Currently he tells me that the pain is doing very well about a 1 out of 10 today. Yesterday was a little bit worse but he tells me that he was more active watering his flowers that day. Overall he feels that his  symptoms are doing significantly better at this point in time. His edema continues to be controlled well with the 4-layer compression wrap and he really has not noted any odor at this point in time. He is tolerating the dressing changes when they are performed well. 06/05/16 at this point in time today patient currently shows no interval signs or symptoms of local or systemic infection. Again his pain level he rates to be a 1 out of 10 at most and overall he tells me that generally this is not giving him much trouble. In fact he even feels maybe a little bit better than last week. We have continue with the 4-layer compression wrap in which she tolerates very well at this point. He is continuing to utilize the [place]. 06/12/16 I think there has been some progression in the status of both of these wounds over today again covered in a gelatinous surface. Has been using Hydrofera Blue. We had used Iodoflex in the past I'm not sure if there was an issue other than changing to something that might progress towards closure faster 06/19/16; he did not tolerate the Flexeril last week secondary to pain and this was changed on Friday back to [facility] area he continues to have copious amounts of gelatinous surface slough which is think inhibiting the speed of healing this area 06/26/16 patient over the last week has utilized the Santyl to try to loosen up some of the tightly adherent slough that was noted on evaluation last week. The good news is he tells me that the medial malleoli region really does not bother him the right llateral malleoli region is more tender to palpation at this point in time especially in the central/inferior location. However it does appear that the Santyl has done his job to loosen up the adherent slough at this point in time. Fortunately he has no interval signs or symptoms of infection locally or systemically no purulent discharge noted. 07/03/16 at this point  in time today patient's wounds appear to be significantly improved over the right medial and lateral malleolus locations. He has much less tenderness at this point in time and the wounds appear clean her although there is still adherent slough this is sufficiently improved over what I saw last week. I still see no evidence of local infection. 07/10/16; continued gradual improvement in the right medial and lateral malleolus locations. The lateral is more substantial wound now divided into 2 by a rim of normal epithelialization. 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 He denies any complaints of pain or intolerance to compression. Both ulcers are smaller in size oriented today's measurements, both are covered with a softly adherent slough. 07/24/16; medial wound is smaller, lateral about the same although surface looks better. Still using Hydrofera Blue 07/31/16; arrives today complaining of pain in the lateral part of his foot. Nurse reports a lot more drainage. He has been using Hydrofera Blue. Switch to silver alginate today 08/03/2016 -- I was asked to see the patient was here for a nurse visit today. I understand he had a lot of pain in his right lower extremity and was  having blisters on his right foot which have not been there before. Though he started on doxycycline he does not have blisters elsewhere on his body. I do not believe this is a drug allergy. also mentioned that there was a copious purulent discharge from the wound and clinically there is no evidence of cellulitis. 08/07/16; I note that the patient came in for his nurse check on Friday apparently with blisters on his toes on the right than a lot of swelling in his forefoot. He continued on the doxycycline that I had prescribed on 12/8. A culture was done of the lateral wound that showed a combination of a few  Proteus and Pseudomonas. Doxycycline might of covered the Proteus but would be unlikely to cover the Pseudomonas. He is on Coumadin. He arrives in the clinic today feeling a lot better states the pain is a lot better but nothing specific really was done other than to rewrap the foot also noted that he had arterial studies ordered in August although these were never done. It is reasonable to go ahead and reorder these. 08/14/16; generally arrives in a better state today in terms of the wounds he has taken cefdinir for one week. Our intake nurse reports copious amounts of drainage but the patient is complaining of much less pain. He is not had his PT and INR checked and I've asked him to do this today or tomorrow. 08/24/2016 -- patient arrives today after 10 days and said he had a stomach upset. His arterial study was done and I have reviewed this report and find it to be within normal limits. However I did not note any venous duplex studies for reflux, and Dr. Leanord Hawking may have ordered these in the past but I will leave it to him to decide if he needs these. The patient has finished his course of cefdinir. 08/28/16; patient arrives today again with copious amounts of thick really green drainage for our intake nurse. He states he has a very tender spot at the superior part of the lateral wound. Wounds are larger 09/04/16; no real change in the condition of this patient's wound still copious amounts of surface slough. Started him on Iodoflex last week he is completing another course of Cefdinir or which I think was done empirically. His arterial study showed ABIs were 1.1 on the right 1.5 on the left. He did have a slightly reduced ABI in the right the left one was not obtained. Had calcification of the right posterior tibial artery. The interpretation was no segmental stenosis. His waveforms were triphasic. JOSHUWA, VECCHIO ([id number]) His reflux studies are later this month. Depending on this I'll  send him for a vascular consultation, he may need to see plastic surgery as I believe he is had plastic surgery on this foot in the past. He had an injury to the foot in the 1980s. 1/16 /18 right lateral greater than right medial ankle wounds on the right in the setting of previous skin grafting. 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 He does not have arterial issues. Each week he comes in with the same adherent surface slough although there was less of this today 09/18/16; right lateral greater than right medial lower extremity wounds in the setting of previous skin grafting and trauma. He has least to vein laser ablation scheduled for February 2 for venous reflux. He does not have significant  arterial disease. Problem has been very difficult to handle surface slough/necrotic tissue. Recently using Iodoflex for this with some, albeit slow improvement 09/25/16; right lateral greater than right medial lower extremity venous wounds in the setting of previous skin grafting. He is going for ablation surgery on February 2 after this he'll come back here for rewrap. He has been using Iodoflex as the primary dressing. 10/02/16; right lateral greater than right medial lower extremity wounds in the setting of previous skin grafting. He had his ablation surgery last week, I don't have a report. He tolerated this well. Came in with a thigh-high Unna boots on Friday. We have been using Iodoflex as the primary dressing. His measurements are improving 10/09/16; continues to make nice aggressive in terms of the wounds on his lateral and medial right ankle in the setting of previous skin grafting. Yesterday he noticed drainage at one of his surgical sites from his venous ablation on the right calf. He took off the bandage over this area felt a "popping" sensation and a reddish-brown drainage. He is not complaining of  any pain 10/16/16; he continues to make nice progression in terms of the wounds on the lateral and medial malleolus. Both smaller using Iodoflex. He had a surgical area in his posterior mid calf we have been using iodoform. All the wounds are down and dimensions 10/23/16; the patient arrives today with no complaints. He states the Iodoflex is a bit uncomfortable. He is not systemically unwell. We have been using Iodoflex to the lateral right ankle and the medial and Aquacel Ag to the reflux surgical wound on the posterior right calf. All of these wounds are doing well 10/30/16; patient states he has no pain no systemic symptoms. I changed him to [facility] last week. Although the wounds are doing well 11/06/16; patient reports no pain or systemic symptoms. We continue with Hydrofera Blue. Both wound areas on the medial and lateral ankle appear to be doing well with improvement and dimensions and improvement in the wound bed. 11/13/16; patient's dimensions continued to improve. We continue with Hydrofera Blue on the medial and lateral side. Appear to be doing well with healthy granulation and advancing epithelialization 11/20/16; patient's dimensions improving laterally by about half a centimeter in length. Otherwise no change on the medial side. Using [facility] 12/04/16; no major change in patient's wound dimensions. Intake nurse reports more drainage. 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 He is voicing no complaints or concerns. He has been tolerating his twice weekly compression therapy changes 12/11/16 Patient complains of pain and increased drainage.. wants hydrofera blue 12/18/16 improvement. Sorbact 12/25/16; medial wound is smaller, lateral measures the same. Still on sorbact 01/01/17; medial wound continues to be smaller, lateral measures about the same however there is clearly  advancing epithelialization here as well in fact I think the wound will ultimately divided into 2 open areas 01/08/17; unfortunately today fairly significant regression in several areas. Surface of the lateral wound covered again in adherent necrotic material which is difficult to debridement. He has significant surrounding skin maceration. The expanding area of tissue epithelialization in the middle of the wound that was encouraging last week appears to be smaller. There is no surrounding tenderness. The area on the medial leg also did not seem to be as healthy as last week, the reason for this regression this week is not totally clear. We have been using  Sorbact for the last 4 weeks. We'll switched of polymen AG which we will order via home medical supply. If there is a problem with this would switch back to Iodoflex 01/15/18; drainage,odor. No change. Switched to polymen last week 01/22/17; still continuous drainage. Culture I did last week showed a few Proteus pansensitive. I did this culture because of drainage. Put him on Augmentin which she has been taking since Saturday however he is developed 4-5 liquid bowel movements. He is also on Coumadin. Beyond this wound is not changed at all, still nonviable necrotic surface material which I debrided reveals healthy granulation line 01/29/17; still copious amounts of drainage reported by her intake nurse. Wound measuring slightly smaller. Currently fact on Iodoflex although I'm looking forward to changing back to [facility] or polymen AG 02/05/17; still large amounts of drainage and presenting with really large amounts of adherent slough and necrotic material over the remaining open area of the wound. We have been using Iodoflex but with little improvement in the surface. [facility] 02/12[facility]17; still large amount of drainage. Much less adherent slough however. Started [facility]	[place] last week [name] StackBLACKWELL, Quince C.  ([id number]) 02/19/17; drainage is better this week. Much less adherent slough. Perhaps some improvement in dimensions. Using [facility] 02/26/17; severe venous insufficiency wounds on the right lateral and right medial leg. Drainage is some better and slough is less adherent we've been using Hydrofera Blue 03/05/17 on evaluation today patient appears to be doing well. His wounds have been decreasing in size and overall he is pleased with how this is progressing. We are awaiting approval for the epigraph which has previously been recommended in the meantime the [facility] Dressing is to be doing very well for him. 03/12/17; wound dimensions are smaller still using Hydrofera Blue. Comes in on Fridays for a dressing change. 03/19/17; wound dimensions continued to contract. Healing of this wound is complicated by continuous significant drainage as well as recurrent buildup of necrotic surface material. We looked into Apligraf and he has a $290 co-pay per application but truthfully I think the drainage as well as the nonviable surface would preclude use of Apligraf there are any other skin substitute at this point therefore the continued plan will be debridement each clinic visit, 2 times a week dressing changes and continued use of Hydrofera Blue. improvement has been very slow but sustained 03/26/17 perhaps slight improvements in peripheral epithelialization is especially inferiorly. Still with large amount of drainage and tightly adherent necrotic surface on arrival. Along with the intake nurse I reviewed previous treatment. He worsened on Iodoflex and had a 4 week trial of sorbact, Polymen AG and a long courses of Aquacel Ag. He is not a candidate for advanced treatment options for many reasons 04/09/17 on evaluation today patient's right lower extremity wounds appear to be doing a little better. Fortunately he has no significant discomfort and has been tolerating the dressing changes including the  wraps without complication. With that being said he really does not have swelling anymore compared to what he has had in the past following his vascular intervention. He wonders if potentially we could attempt avoiding the rats to see if he could cleanse the wound in between to try to prevent some of the fiber and its buildup that occurs in the interim between when we see him week to week. No fevers, chills, nausea, or vomiting noted at this time. 04/16/17; noted that the staff made a choice last week not to put  him in compression. The patient is changing his dressing at home using [facility] and changing every second day. His wife is washing the wound with saline. He is using kerlix. Surprisingly he has not developed a lot of edema. This choice was made because of the degree of fluid retention and maceration even with changing his dressing twice a week 04/23/17; absolutely no change. Using Hydrofera Blue. Recurrent tightly adherent nonviable surface material. This is been refractory to Iodoflex, sorbact and now Hydrofera Blue. More recently he has been changing his own dressings at home, cleansing the wound in the shower. He has not developed lower extremity edema 04/30/17; if anything the wound is larger still in adherent surface although it debrided easily today. I've been using Mehta honey for 1 week and I'd like to try and do it for a second week this see if we can get some form of viable surface here. 05/07/17; patient arrives with copious amounts of drainage and some pain in the superior part of the wound. He has not been systemically unwell. He's been changing this daily at home 05/14/17; patient arrives today complaining of less drainage and less pain. Dimensions slightly better. Surface culture I did of this last week grew MSSA I put him on dicloxacillin for 10 days. Patient requesting a further prescription since he feels so much better. He did not obtain the [facility] for reasons that  aren't clear, he has been using Hydrofera Blue 05/21/17; perhaps some less drainage and less pain. He is completing another week of doxycycline. Unfortunately there is no change in the wound measurements are appearance still tightly adherent necrotic surface material that is really defied treatment. We have been using Hydrofera Blue most recently. He did not manage to get Anasept. He was told it was prescription at [facility] 05/29/17; absolutely no improvement in these wound areas. He had remote plastic surgery with who was done at [facility] in [place] surrounding this area. This was a traumatic wound with extensive plastic surgery/skin grafting at that time. I switched him to sorbact last week to see if he can do anything about the recurrent necrotic surface and drainage. This is largely been refractory to Iodoflex/Hydrofera Blue/alginates. I believe we tried Elby Showers and more recently Medi honey l however the drainage is really too excessive 06/04/17; patient went to see Dr. Ulice Bold. Per the patient she ordered him a compression stocking. Continued the Anasept gel and sorbact was already on. No major improvement in the condition of the wounds in fact the medial wound looks larger. Again per the patient she did not feel a skin graft or operative debridement was indicated The patient had previous venous ablation in February 2018. Last arterial studies were in December 2017 and were within normal limits 06/18/17; patient arrives back in clinic today using Anasept gel with sorbact. He changes this every day is wearing his own compression stocking. The patient actually saw Dr. Leta Baptist of plastic surgery. I've reviewed her note. The appointment was on 05/30/17. The basic issue with here was that she did not feel that skin grafts for patients with underlying stasis and edema have a good long-term success rate. She also discuss skin substitutes which has been done here as well however I have not  been able to get the surface of these wounds to something that I think would support skin substitutes. This is why he felt he might need an QUINNLAN, ABRUZZO. ([id number]) operative debridement more aggressively than I can do in this clinic  Review of systems; is otherwise negative he feels well 07/02/17; patient has been using Anasept gel with Sorbact. He changes this every day scrubs out the wound beds. Arrives today with the wound bed looking some better. Easier debridement 07/16/17; patient has been using Anasept gel was sorbact for about a month now. The necrotic service on his wound bed is better and the drainage is now down to minimal but we've not made any improvements in the epithelialization. Change to Santyl today. Surface of the wound is still not good enough to support skin substitutes 07/30/17 on evaluation today patient appears to be doing very well in regard to his right bimalleolus wounds. He has been tolerating the treatment with the Anasept gel and sorbact. However we were going to try and switch to Santyl to be more aggressive with these wounds. This was however cost prohibitive. Therefore we will likely go back to the sorbact. Nonetheless he is not having any significant discomfort he still is forming slough over the wound location but nothing too significant. The wounds do appear to be filling in nicely. 08/13/17; I have not seen this wound in about a month. There is not much change. The fact the area medially looks larger. He has been using sorbact and Anasept for over a month now without much effect. Arrives in clinic stating that he does not want the area debrided 08/28/17; patient arrives with the areas on his right medial and right lateral ankle worse. The wounds of expanded there is erythema and still drainage. There is no pain and no tenderness. We had been using Keracel AG clearly not doing well. The patient has had previous ablations I'm going to send him back to  vascular surgery to see if anything else can be done both wound areas are larger 09/10/17 on evaluation today patient appears to be doing a little bit worse in regard to his medial and lateral malleolus ulcer's of the right lower extremity. He states that even the evening that we began utilizing the Iodoflex he started having burning. Subsequently this never really improved and he eventually discontinue the use of the Iodoflex altogether. Unfortunately he did not let us know about any of this until today. I'm unsure of any specific infection at this point although I am going to obtain a wound culture today in order to see if there's anything that could potentially be causing an infection as well. It does sound as if he's had the Iodoflex before and did not have this kind of reaction although this does sound very specific for a reaction to the Iodoflex. 09/17/17 on evaluation today patient appears to be doing significantly better compared to last week's evaluation. At that time it actually appears that he did have an infection the good news is that we have been able to get this under control with switching to the [facility] solution he's not having as much pain and discomfort he still does have some erythema surrounding the medial aspect wound. He has been tolerating the Dakin's soaked gauze dressing which is excellent and that his wounds do not seem to have as much adherent slough. Overall I'm pleased with the progress she's made in last week. 11/05/17 on evaluation today patient appears to be doing better in regard to his right medial and lateral malleolus ulcers. He has been tolerating the dressing changes without complication. I do flight the Freada Bergeronrisma is helping with additional granulation at this point in the wound beds do appear to be a little bit  better. With that being said patient does not appear to have any evidence of significant infection at this point there is no erythema as he has previously  noted he does note that the 30 day course of antibiotics I placed him on will end tomorrow. 11/12/17 on evaluation today patient actually appears to be doing excellent in regard to his right medial and lateral malleolus ulcers. He has been tolerating the dressing changes without complication. Fortunately he has no evidence of infection at this time and overall I believe he is progressing extremely nicely he has a lot of new epithelialization noted on evaluation today. Patient likewise is very pleased with the progress even just since last week. 11/19/17 on evaluation today patient appears to be doing about the same in regard to his ulcerations. He does not have any additional breakdown although he really has not noted any significant improvement either over the past week. With that being said the more concerning thing at this time is that he is beginning to show signs of erythema surrounding both wounds which is what typically happens and then he will develop into a more overt infection. This has been the course for some time. I hope that doing the 30 day in a biotic cycle this past time would break that so that he would not have the same type of thing happen again. Nonetheless it appears that is digressing back into this infected state unfortunately. With that being said he is not having any pain currently which is good he typically does start to hurt more as this progresses. Fortunately there does not appear to be any evidence of infection systemically which is good news. 11/26/17 on evaluation today patient appears to actually be doing rather well in regard to his ulcer compared to last week. He still has your theme and noted around both the medial and lateral malleolus ulcers of the right lower extremity. He does not seem to be quite as overtly infected however. He has been tolerating the dressing changes without complication which is good news. The gentamicin cream does seem to have been of benefit  for him. With that being said he does actually have an appointment with Dr. Sampson Goon this morning to see if there's anything that would be recommended in the way of IV antibiotic Crocket, Jahquan C. ([id number]) therapy. Otherwise he still has slough noted on the surface of the wound. 12/03/17 on evaluation today patient presents for follow-up concerning his ongoing right lower extremity ulcers. He has been tolerating the dressing changes without complication he states he has not really been using gentamicin on the lateral ankle and this seems to be doing very well. For that reason I think that is probably fine to put a hold on the gentamicin he states his burns and stings when he applies it and we maybe be able to just use this intermittently when things become more irritated. Other than that the good news is I did review his MRI which was performed on 11/28/17 and revealed that he has a negative MRI for abscess, osteomyelitis, or septic joint. Obviously these were the issues that I was concerned with the possibility of and I'm pleased to find that there is no problems in that regard. I did also review Dr. Jarrett Ables note from infectious disease he discussed performed some lab work which apparently was done and then subsequently was going to consider the possibility of Keflex long-term for prevention of infection although this has not been prescribed as of yet. 12/09/17  on evaluation today patient appears to be doing a little bit worse in regard to his right medial and lateral malleolus ulcers. Fortunately the wound does not seem to be significantly worse although he does have a lot more drainage especially the lateral aspect he is having a lot more pain than what he has noted more recently. Fortunately there does not appear to be any evidence of systemic infection which is good news. Again he has seen Dr. Sampson Goon but he has not been placed on the potential Keflex which was recommended as a  possibility at least yet. 12/17/17 unfortunately on evaluation today the patient's wound appears to be doing much worse. He has been performing the dressing changes as directed. Upon a review of notes in epic it does appear that Dr. Jarrett Ables office specifically sending Keflex for the patient on the 16th which was last Tuesday. With that being said the patient states that though this was sent into [facility] that [facility] stated they never received it and subsequently the patient never took anything. He tells me he has been in bed since Friday due to the increased pain and overall general malaise. No fevers, chills, nausea, or vomiting noted at this time. 02/25/18 on evaluation today patient appears to be doing rather well in regard to his bilateral right malleolus ulcers. He has been tolerating the dressing changes without complication. Currently there does not appear to be any evidence of infection. Overall I'm very pleased with the progress that seems to been made up to this point. 03/11/18 on evaluation today patient actually appears to be doing very well in regard to his right ankle ulcers. He's been tolerating the dressing changes without complication. Fortunately there does not appear to be any evidence of sniffing infection I do believe that the gentamicin cream continues to be of benefit for him. Fortunately he is showing signs of healing in which time I see him. He also tells me he's having no pain. 03/25/18 on evaluation today patient actually appears to show signs of improvement especially in regard to the right lateral ankle ulcer. He has been tolerating the dressing changes without complication. In general I'm very pleased with the progress that has been made up to this point. 04/08/18- He presents in follow up evaluation for right bimalleolar wounds; there is essentially no change. He admits to removing the dressing at night to allow the wounds to be "air out". We will continue with same  treatment plan and he will follow up in two weeks 04/22/18 on evaluation today patient seems very frustrated with this situation currently as far as the ulcer on his right medial and lateral malleolus is concerned. He states this has been going on a very long time and obviously he does have a lot of bills as well is far as wound care is concerned. With that being said he tells me that he's been tolerating the dressing changes well although he did clarify that what he is doing is putting the medicine on in the morning after shower and then subsequently he takes the dressing off at night leaving it open to air. I previously asked him about this and I was not aware that he was doing this. Nonetheless that may be slowing things down a bit at this point. Fortunately there does not appear to be any evidence of severe infection although he does have some evidence of your theme is surrounding the wound bed. He is still using the gentamicin cream. 05/06/18 on evaluation today patient  actually presents for evaluation of his right medial malleolus ulcers. He's been tolerating the dressing changes without complication. With that being said it does appear that he is doing better compared to last evaluation. He's not having the pain like he did previous. He also states that blisters that he had coming up when I saw him last did resolve and ended up doing okay he did go to the ER they gave him some cream to put on it he's not sure what the name of the cream was. He was down in [facility] when he called me and I spoke with him previous. Nonetheless he states that they told him to put the medicine on his our due list. With that being said I'm not really 100% sure that the medicine had anything to do with this due to the fact that again he was having the blisters when he came into the office which is why we actually put them on the anabiotic I was concerned about him having an infection which was causing the  blistering and increased pain in his extremity. Nonetheless this is something that we can definitely be cautious of in the future it was Bactrim [place], [place] C. ([id number]) that we had him on. Overall I'm glad he is doing better. 05/20/18 on evaluation today patient actually appears to be doing fairly well in regard to his ulcer on the right lateral and medial malleolus locations. Both seem to be doing decently well he does have slough building up on the surface of the wound. He has been using the Hibiclense as I instructed him. Overall there's no evidence of significant infection which is good news. 06/02/18 on evaluation today patient actually appears to be doing excellent in regard to his right medial and lateral malleolus ulcers. He has been tolerating the dressing changes without complication. There does not appear to be any evidence of infection at this time which is great news. In general I feel like he has made great progress in the past several weeks. He has good granulation noted over areas that have not had good granulation previously and epithelialization even more so in several areas. Both wounds measured smaller. 06/17/18 an evaluation today patient actually appears to be doing excellent in regard to his right lateral and medial malleolus ulcers. He has been tolerating the dressing changes without complication fortunately the Hydrofera Blue Dressing to be doing excellent for him. He's actually making progress each time I see him. 07/08/18 on evaluation today patient's wounds on the right medial and lateral malleolus or region show signs of improvement currently. He has been tolerating the dressing changes without complication at this time. He does feel like the [facility] Dressing has been of benefit for him. No fevers, chills, nausea, or vomiting noted at this time. 07/22/18 and evaluation today patient actually appears to be doing excellent in regard to his right medial and  lateral malleolus ulcers. He's been tolerating the dressing changes without complication the Hydrofera Blue Dressing years be excellent for him. Overall I'm extremely pleased in this regard. 08/05/18 on evaluation today patient's right lateral and medial malleolus ulcers appear to be doing well at this point. There does not appear to be any evidence of infection which is good news. Overall he does have some Slough noted which is going to require sharp debridement but other than this I see no current issues 08/19/18 on evaluation today patient actually appears to be doing better yet again in regard to his lower [employer]  ulcer on the right medial and lateral malleolus regions. He is having no pain and no significant signs of infection which is great news. Overall things seem to be progressing quite nicely which is great news. No fevers, chills, nausea, or vomiting noted at this time. 09/09/18 on evaluation today patient appears to be doing very well in regard to his medial and lateral ankle ulcers. He's been tolerating the dressing changes without complication. Fortunately there is no evidence of infection at this time which is good news. No fevers, chills, nausea, or vomiting noted at this time. 09/23/18 on evaluation today patient appears to be doing rather well in regard to his right medial and lateral ankle ulcers. He is been tolerating the dressing changes and still things continue to do excellent. Overall very pleased with the progress and there's no signs of infection today. Patient History Information obtained from Patient. Social History Former smoker - 10 years ago, Marital Status - Married, Alcohol Use - Moderate, Drug Use - No History, Caffeine Use - Never. Medical History Eyes Patient has history of Cataracts - removed Denies history of Glaucoma, Optic Neuritis Ear/Nose/Mouth/Throat Denies history of Chronic sinus problems/congestion Hematologic/Lymphatic Denies history of Anemia,  Hemophilia, Human Immunodeficiency Virus, Lymphedema, Sickle Cell Disease Respiratory Patient has history of Chronic Obstructive Pulmonary Disease (COPD) Denies history of Aspiration, Asthma, Pneumothorax, Sleep Apnea, Tuberculosis Joaquin, Jakai C. ([id number]) Cardiovascular Denies history of Angina, Arrhythmia, Congestive Heart Failure, Coronary Artery Disease, Deep Vein Thrombosis, Hypertension, Hypotension, Myocardial Infarction, Peripheral Arterial Disease, Peripheral Venous Disease, Phlebitis, Vasculitis Gastrointestinal Denies history of Cirrhosis , Colitis, Crohn s, Hepatitis A, Hepatitis B, Hepatitis C Endocrine Denies history of Type I Diabetes, Type II Diabetes Genitourinary Denies history of End Stage Renal Disease Immunological Denies history of Lupus Erythematosus, Raynaud s Integumentary (Skin) Denies history of History of Burn, History of pressure wounds Musculoskeletal Patient has history of Osteoarthritis Denies history of Gout, Rheumatoid Arthritis, Osteomyelitis Neurologic Denies history of Dementia, Neuropathy, Quadriplegia, Paraplegia, Seizure Disorder Oncologic Denies history of Received Chemotherapy, Received Radiation Psychiatric Denies history of Anorexia/bulimia, Confinement Anxiety Medical And Surgical History Notes Constitutional Symptoms (General Health) Vein Filter (groin area); CODA; H/O Blood Clots; pulmonary hypertensive arterial disease Review of Systems (ROS) Constitutional Symptoms (General Health) Denies complaints or symptoms of Fever, Chills. Respiratory The patient has no complaints or symptoms. Cardiovascular The patient has no complaints or symptoms. Psychiatric The patient has no complaints or symptoms. Objective Constitutional Well-nourished and well-hydrated in no acute distress. Vitals Time Taken: 8:08 AM, Height: 76 in, Weight: 238 lbs, BMI: 29, Temperature: 98.2 F, Pulse: 82 bpm, Respiratory Rate: 16 breaths/min, Blood  Pressure: 143/74 mmHg. Respiratory normal breathing without difficulty. Psychiatric ELISHA, COOKSEY ([id number]) this patient is able to make decisions and demonstrates good insight into disease process. Alert and Oriented x 3. pleasant and cooperative. General Notes: Patient's wound bed currently shows signs of good granulation at this time the was some [facility] noted which required sharp debridement have both sides. This was performed without complication post debridement the wound bed's appear to be doing much better. Overall I do feel like he has made excellent progress. Integumentary (Hair, Skin) Wound #1 status is Open. Original cause of wound was Gradually Appeared. The wound is located on the Right,Lateral Malleolus. The wound measures 4cm length x 4.2cm width x 0.2cm depth; 13.195cm^2 area and 2.639cm^3 volume. There is Fat Layer (Subcutaneous Tissue) Exposed exposed. There is no tunneling or undermining noted. There is a medium amount of serous drainage noted. The  wound margin is flat and intact. There is medium (34-66%) red granulation within the wound bed. There is a medium (34-66%) amount of necrotic tissue within the wound bed including Adherent Slough. The periwound skin appearance exhibited: Scarring. The periwound skin appearance did not exhibit: Callus, Crepitus, Excoriation, Induration, Rash, Dry/Scaly, Maceration, Atrophie Blanche, Cyanosis, Ecchymosis, Hemosiderin Staining, Mottled, Pallor, Rubor, Erythema. Periwound temperature was noted as No Abnormality. The periwound has tenderness on palpation. Wound #2 status is Open. Original cause of wound was Gradually Appeared. The wound is located on the Right,Medial Malleolus. The wound measures 2cm length x 1.8cm width x 0.2cm depth; 2.827cm^2 area and 0.565cm^3 volume. There is Fat Layer (Subcutaneous Tissue) Exposed exposed. There is a medium amount of serous drainage noted. The wound margin is flat and intact. There is no  granulation within the wound bed. There is a large (67-100%) amount of necrotic tissue within the wound bed including Adherent Slough. The periwound skin appearance exhibited: Scarring. The periwound skin appearance did not exhibit: Callus, Crepitus, Excoriation, Induration, Rash, Dry/Scaly, Maceration, Atrophie Blanche, Cyanosis, Ecchymosis, Hemosiderin Staining, Mottled, Pallor, Rubor, Erythema. The periwound has tenderness on palpation. 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 There was a Excisional Skin/Subcutaneous Tissue Debridement with a total area of 16.8 sq cm performed by STONE III, HOYT E., PA-C. With the following instrument(s): Curette to remove Viable and Non-Viable tissue/material. Material removed includes Subcutaneous Tissue and Slough and after achieving pain control using Lidocaine 4% Topical Solution. No specimens were taken. A time out was conducted at 08:26, prior to the start of the procedure. A Minimum amount of bleeding was controlled with Pressure. The procedure was tolerated well with a pain level of 0 throughout and a pain level of 0 following the procedure. Post Debridement Measurements: 4cm length x 4.2cm width x 0.3cm depth; 3.958cm^3 volume. Character of Wound/Ulcer Post Debridement is improved. Severity of Tissue Post Debridement is: Fat layer exposed. Post procedure Diagnosis Wound #1: Same as Pre-Procedure Volker, Lido C. ([id number]) Wound #2 Pre-procedure  diagnosis of Wound #2 is a Venous Leg Ulcer located on the Right,Medial Malleolus .Severity of Tissue Pre Debridement is: Fat layer exposed. There was a Excisional Skin/Subcutaneous Tissue Debridement with a total area of 3.6 sq cm performed by STONE III, HOYT E., PA-C. With the following instrument(s): Curette to remove Viable and Non-Viable tissue/material. Material removed includes Subcutaneous Tissue and Slough and after achieving pain control using Lidocaine 4% Topical Solution. No specimens were taken. A time out was conducted at 08:24, prior to the start of the procedure. A Minimum amount of bleeding was controlled with Pressure. The procedure was tolerated well with a pain level of 0 throughout and a pain level of 0 following the procedure. Post Debridement Measurements: 2cm length x 1.8cm width x 0.3cm depth; 0.848cm^3 volume. Character of Wound/Ulcer Post Debridement is improved. Severity of Tissue Post Debridement is: Fat layer exposed. Post procedure Diagnosis Wound #2: Same as Pre-Procedure Plan Wound Cleansing: Wound #1 Right,Lateral Malleolus: Clean wound with Normal Saline. May Shower, gently pat wound dry prior to applying new dressing. - wash with hibiclens daily and leave  on 5 mins Wound #2 Right,Medial Malleolus: Clean wound with Normal Saline. May Shower, gently pat wound dry prior to applying new dressing. - wash with hibiclens and leave on 5 mins Anesthetic (add to Medication List): Wound #1 Right,Lateral Malleolus: Topical Lidocaine 4% cream applied to wound bed prior to debridement (In Clinic Only). Wound #2 Right,Medial Malleolus: Topical Lidocaine 4% cream applied to wound bed prior to debridement (In Clinic Only). Primary Wound Dressing: Wound #1 Right,Lateral Malleolus: Hydrafera Blue Ready Transfer Other: - Gentamicin cream to wound bed Wound #2 Right,Medial Malleolus: Hydrafera Blue Ready Transfer Other: - Gentamicin cream to wound bed Secondary  Dressing: Wound #1 Right,Lateral Malleolus: Dry Gauze Conform/Kerlix Wound #2 Right,Medial Malleolus: Dry Gauze Conform/Kerlix Dressing Change Frequency: Wound #1 Right,Lateral Malleolus: Change dressing every other day. Wound #2 Right,Medial Malleolus: Change dressing every other day. Follow-up Appointments: Wound #1 Right,Lateral Malleolus: Return Appointment in 2 weeks. Wound #2 Right,Medial Malleolus: Return Appointment in 2 weeks. Edema Control: Wound #1 Right,Lateral Malleolus: Elevate legs to the level of the heart and pump ankles as often as possible Ceja, Anton C. ([id number]) Wound #2 Right,Medial Malleolus: Elevate legs to the level of the heart and pump ankles as often as possible Additional Orders / Instructions: Wound #1 Right,Lateral Malleolus: Increase protein intake. Wound #2 Right,Medial Malleolus: Increase protein intake. My suggestion at this point is gonna be that we go ahead and continue with the above wound care measures for the next two weeks. The patient is in agreement that plan. If anything changes in the meantime he will contact the office and let me know. Please see above for specific wound care orders. We will see patient for re-evaluation in 2 week(s) here in the clinic. If anything worsens or changes patient will contact our office for additional recommendations. Electronic Signature(s) Signed: 09/25/2018 1:01:02 AM By: Lenda Kelp PA-C Entered By: Lenda Kelp on 09/23/2018 08:36:24 [name] ([id number]) -------------------------------------------------------------------------------- ROS/PFSH Details Patient Name: [name] Date of Service: 09/23/2018 8:00 AM Medical Record Number: [id number] Patient Account Number: [id number] Date of Birth/Sex: [birth year] (73 y.o. M) Treating RN: Curtis Sites Primary Care Provider: [name] Other Clinician: Referring Provider: [name] Treating Provider/Extender:  STONE III, HOYT Weeks in Treatment: 140 Information Obtained From Patient Wound History Do you currently have one or more open woundso Yes How many open wounds do you currently haveo 1 Approximately how long have you had your woundso 5 months How have you been treating your wound(s) until nowo saline, dressing Has your wound(s) ever healed and then re-openedo Yes Have you had any lab work done in the past montho No Have you tested positive for an antibiotic resistant organism (MRSA, VRE)o No Have you tested positive for osteomyelitis (bone infection)o No Have you had any tests for circulation on your legso No Constitutional Symptoms (General Health) Complaints and Symptoms: Negative for: Fever; Chills Medical History: Past Medical History Notes: Vein Filter (groin area); CODA; H/O Blood Clots; pulmonary hypertensive arterial disease Eyes Medical History: Positive for: Cataracts - removed Negative for: Glaucoma; Optic Neuritis Ear/Nose/Mouth/Throat Medical History: Negative for: Chronic sinus problems/congestion Hematologic/Lymphatic Medical History: Negative for: Anemia; Hemophilia; Human Immunodeficiency Virus; Lymphedema; Sickle Cell Disease Respiratory Complaints and Symptoms: No Complaints or Symptoms Medical History: Positive for: Chronic Obstructive Pulmonary Disease (COPD) Negative for: Aspiration; Asthma; Pneumothorax; Sleep Apnea; Tuberculosis Cardiovascular SKYY, MCKNIGHT. ([id number]) Complaints and Symptoms: No Complaints or Symptoms Medical History: Negative for: Angina; Arrhythmia; Congestive Heart Failure; Coronary Artery Disease; Deep Vein Thrombosis; Hypertension;  Hypotension; Myocardial Infarction; Peripheral Arterial Disease; Peripheral Venous Disease; Phlebitis; Vasculitis Gastrointestinal Medical History: Negative for: Cirrhosis ; Colitis; Crohnos; Hepatitis A; Hepatitis B; Hepatitis C Endocrine Medical History: Negative for: Type I Diabetes;  Type II Diabetes Genitourinary Medical History: Negative for: End Stage Renal Disease Immunological Medical History: Negative for: Lupus Erythematosus; Raynaudos Integumentary (Skin) Medical History: Negative for: History of Burn; History of pressure wounds Musculoskeletal Medical History: Positive for: Osteoarthritis Negative for: Gout; Rheumatoid Arthritis; Osteomyelitis Neurologic Medical History: Negative for: Dementia; Neuropathy; Quadriplegia; Paraplegia; Seizure Disorder Oncologic Medical History: Negative for: Received Chemotherapy; Received Radiation Psychiatric Complaints and Symptoms: No Complaints or Symptoms Medical History: Negative for: Anorexia/bulimia; Confinement Anxiety HBO Extended History Items ATHARV, BARRIERE. ([id number]) Eyes: Cataracts Immunizations Pneumococcal Vaccine: Received Pneumococcal Vaccination: No Implantable Devices Family and Social History Former smoker - 10 years ago; Marital Status - Married; Alcohol Use: Moderate; Drug Use: No History; Caffeine Use: Never; Advanced Directives: No; Patient does not want information on Advanced Directives; Living Will: No; Medical Power of Attorney: No Physician Affirmation I have reviewed and agree with the above information. Electronic Signature(s) Signed: 09/23/2018 4:47:13 PM By: Curtis Sites Signed: 09/25/2018 1:01:02 AM By: Lenda Kelp PA-C Entered By: Lenda Kelp on 09/23/2018 08:35:48 [name] ([id number]) -------------------------------------------------------------------------------- SuperBill Details Patient Name: [name] Date of Service: 09/23/2018 Medical Record Number: [id number] Patient Account Number: [id number] Date of Birth/Sex: 29-Dec-1945 (73 y.o. 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, sequela T81.31XS Disruption of external operation (surgical) wound, not elsewhere classified, sequela Facility Procedures CPT4 Code: 08657846 Description: 11042 - DEB SUBQ TISSUE 20 SQ CM/< ICD-10 Diagnosis Description L97.312 Non-pressure chronic ulcer of right ankle with fat layer ex Modifier: posed Quantity: 1 CPT4 Code: 96295284 Description: 11045 - DEB SUBQ TISS EA ADDL 20CM ICD-10 Diagnosis Description L97.312 Non-pressure chronic ulcer of right ankle with fat layer ex Modifier: posed Quantity: 1 Physician Procedures CPT4 Code: 1324401 Description: 11042 - WC PHYS SUBQ TISS 20 SQ CM ICD-10 Diagnosis Description L97.312 Non-pressure chronic ulcer of right ankle with fat layer exp Modifier: osed Quantity: 1 CPT4 Code: 0272536 Description: 11045 - WC PHYS SUBQ TISS EA ADDL 20 CM ICD-10 Diagnosis Description L97.312 Non-pressure chronic ulcer of right ankle with fat layer exp Modifier: osed Quantity: 1 Electronic Signature(s) Signed: 09/25/2018 1:01:02 AM By: Lenda Kelp PA-C Entered By: Lenda Kelp on 09/23/2018 08:36:46

## 2018-09-25 NOTE — Progress Notes (Signed)
[name], Roi C. ([id number]) Visit Report for 09/23/2018 Arrival Information Details Patient Name: [name], Taegan C. Date of Service: 09/23/2018 8:00 AM Medical Record Number: [id number] Patient Account Number: [id number] Date of Birth/Sex: Nov 11, 1945 [age] y.o. M) Treating RN: Huel CoventryWoody, Kim Primary Care Itzae Miralles: Darreld McleanMILES, LINDA Other Clinician: Referring Darvell Monteforte: Darreld McleanMILES, LINDA Treating Jesaiah Fabiano/Extender: Linwood DibblesSTONE III, HOYT Weeks in Treatment: 140 Visit Information History Since Last Visit Added or deleted any medications: No Patient Arrived: Ambulatory Any new allergies or adverse reactions: No Arrival Time: 08:05 Had a fall or experienced change in No Accompanied By: self activities of daily living that may affect Transfer Assistance: None risk of falls: Patient Identification Verified: Yes Signs or symptoms of abuse/neglect since last visito No Secondary Verification Process Yes Hospitalized since last visit: No Completed: Implantable device outside of the clinic excluding No Patient Requires Transmission-Based No cellular tissue based products placed in the center Precautions: since last visit: Patient Has Alerts: Yes Has Dressing in Place as Prescribed: Yes Patient Alerts: Patient on Blood Pain Present Now: No Thinner Electronic Signature(s) Signed: 09/23/2018 6:17:11 PM By: Elliot GurneyWoody, BSN, RN, CWS, Kim RN, BSN Entered By: Elliot GurneyWoody, BSN, RN, CWS, Kim on 09/23/2018 08:05:48 [name], Martavious C. ([id number]) -------------------------------------------------------------------------------- Encounter Discharge Information Details Patient Name: [name], [place] C. Date of Service: 09/23/2018 8:00 AM Medical Record Number: [id number] Patient Account Number: [id number] Date of Birth/Sex: Nov 11, 1945 [age] y.o. M) Treating RN: Curtis Sitesorthy, Joanna Primary Care Elric Tirado: Darreld McleanMILES, LINDA Other Clinician: Referring Skanda Worlds: Darreld McleanMILES, LINDA Treating Archit Leger/Extender: STONE III, HOYT Weeks in Treatment:  140 Encounter Discharge Information Items Post Procedure Vitals Discharge Condition: Stable Temperature (F): 98.2 Ambulatory Status: Ambulatory Pulse (bpm): 82 Discharge Destination: Home Respiratory Rate (breaths/min): 16 Transportation: Private Auto Blood Pressure (mmHg): 143/74 Accompanied By: self Schedule Follow-up Appointment: Yes Clinical Summary of Care: Electronic Signature(s) Signed: 09/23/2018 4:47:13 PM By: Curtis Sitesorthy, Joanna Entered By: Curtis Sitesorthy, Joanna on 09/23/2018 08:32:19 [name], Aveion C. ([id number]) -------------------------------------------------------------------------------- Lower Extremity Assessment Details Patient Name: [name], Vincenzo C. Date of Service: 09/23/2018 8:00 AM Medical Record Number: [id number] Patient Account Number: [id number] Date of Birth/Sex: Nov 11, 1945 [age] y.o. M) Treating RN: Huel CoventryWoody, Kim Primary Care Densel Kronick: Darreld McleanMILES, LINDA Other Clinician: Referring Cassian Torelli: Darreld McleanMILES, LINDA Treating Idell Hissong/Extender: STONE III, HOYT Weeks in Treatment: 140 Edema Assessment Assessed: [Left: No] [Right: No] Edema: [Left: N] [Right: o] Vascular Assessment Pulses: Dorsalis Pedis Palpable: [Right:Yes] Posterior Tibial Extremity colors, hair growth, and conditions: Extremity Color: [Right:Hyperpigmented] Hair Growth on Extremity: [Right:No] Temperature of Extremity: [Right:Warm] Capillary Refill: [Right:< 3 seconds] Toe Nail Assessment Left: Right: Thick: Yes Discolored: Yes Deformed: No Improper Length and Hygiene: No Electronic Signature(s) Signed: 09/23/2018 6:17:11 PM By: Elliot GurneyWoody, BSN, RN, CWS, Kim RN, BSN Entered By: Elliot GurneyWoody, BSN, RN, CWS, Kim on 09/23/2018 08:10:51 [name], Esgar C. ([id number]) -------------------------------------------------------------------------------- Multi Wound Chart Details Patient Name: [name], Tommey C. Date of Service: 09/23/2018 8:00 AM Medical Record Number: [id number] Patient Account Number: [id number] Date  of Birth/Sex: Nov 11, 1945 [age] y.o. M) Treating RN: Curtis Sitesorthy, Joanna Primary Care Lori Liew: Darreld McleanMILES, LINDA Other Clinician: Referring German Manke: Darreld McleanMILES, LINDA Treating Shaakira Borrero/Extender: STONE III, HOYT Weeks in Treatment: 140 Vital Signs Height(in): 76 Pulse(bpm): 82 Weight(lbs): 238 Blood Pressure(mmHg): 143/74 Body Mass Index(BMI): 29 Temperature(F): 98.2 Respiratory Rate 16 (breaths/min): Photos: [N/A:N/A] Wound Location: Right Malleolus - Lateral Right Malleolus - Medial N/A Wounding Event: Gradually Appeared Gradually Appeared N/A Primary Etiology: Venous Leg Ulcer Venous Leg Ulcer N/A Comorbid History: Cataracts, Chronic Obstructive Cataracts, Chronic Obstructive N/A Pulmonary Disease (COPD), Pulmonary Disease (COPD), Osteoarthritis Osteoarthritis Date Acquired: 08/11/2015 01/30/2016 N/A Weeks of Treatment:  140 138 N/A Wound Status: Open Open N/A Clustered Wound: No Yes N/A Measurements L x W x D 4x4.2x0.2 2x1.8x0.2 N/A (cm) Area (cm) : 13.195 2.827 N/A Volume (cm) : 2.639 0.565 N/A % Reduction in Area: 23.60% 75.50% N/A % Reduction in Volume: 49.10% 51.10% N/A Classification: Full Thickness Without Full Thickness Without N/A Exposed Support Structures Exposed Support Structures Exudate Amount: Medium Medium N/A Exudate Type: Serous Serous N/A Exudate Color: amber amber N/A Wound Margin: Flat and Intact Flat and Intact N/A Granulation Amount: Medium (34-66%) None Present (0%) N/A Granulation Quality: Red N/A N/A Necrotic Amount: Medium (34-66%) Large (67-100%) N/A Exposed Structures: Fat Layer (Subcutaneous Fat Layer (Subcutaneous N/A Tissue) Exposed: Yes Tissue) Exposed: Yes Fascia: No Fascia: No Tendon: No Tendon: No Driggs, Sagar C. ([id number]) Muscle: No Muscle: No Joint: No Joint: No Bone: No Bone: No Epithelialization: None None N/A Periwound Skin Texture: Scarring: Yes Scarring: Yes N/A Excoriation: No Excoriation: No Induration:  No Induration: No Callus: No Callus: No Crepitus: No Crepitus: No Rash: No Rash: No Periwound Skin Moisture: Maceration: No Maceration: No N/A Dry/Scaly: No Dry/Scaly: No Periwound Skin Color: Atrophie Blanche: No Atrophie Blanche: No N/A Cyanosis: No Cyanosis: No Ecchymosis: No Ecchymosis: No Erythema: No Erythema: No Hemosiderin Staining: No Hemosiderin Staining: No Mottled: No Mottled: No Pallor: No Pallor: No Rubor: No Rubor: No Temperature: No Abnormality N/A N/A Tenderness on Palpation: Yes Yes N/A Wound Preparation: Ulcer Cleansing: Ulcer Cleansing: N/A Rinsed/Irrigated with Saline Rinsed/Irrigated with Saline Topical Anesthetic Applied: Topical Anesthetic Applied: Other: lidocaine 4% Other: lidocaine 4% Treatment Notes Electronic Signature(s) Signed: 09/23/2018 4:47:13 PM By: Curtis Sitesorthy, Joanna Entered By: Curtis Sitesorthy, Joanna on 09/23/2018 08:25:22 [name], Aariz C. ([id number]) -------------------------------------------------------------------------------- Multi-Disciplinary Care Plan Details Patient Name: [name], Faaris C. Date of Service: 09/23/2018 8:00 AM Medical Record Number: [id number] Patient Account Number: [id number] Date of Birth/Sex: August 09, 1946 [age] y.o. M) Treating RN: Huel CoventryWoody, Kim Primary Care Jini Horiuchi: Darreld McleanMILES, LINDA Other Clinician: Referring Levetta Bognar: Darreld McleanMILES, LINDA Treating Kahlia Lagunes/Extender: Linwood DibblesSTONE III, HOYT Weeks in Treatment: 140 Active Inactive Venous Leg Ulcer Nursing Diagnoses: Knowledge deficit related to disease process and management Goals: Patient will maintain optimal edema control Date Initiated: 04/03/2016 Target Resolution Date: 11/24/2016 Goal Status: Active Interventions: Compression as ordered Treatment Activities: Therapeutic compression applied : 04/03/2016 Notes: Wound/Skin Impairment Nursing Diagnoses: Impaired tissue integrity Goals: Ulcer/skin breakdown will heal within 14 weeks Date Initiated: 01/17/2016 Target  Resolution Date: 11/24/2016 Goal Status: Active Interventions: Assess ulceration(s) every visit Notes: Electronic Signature(s) Signed: 09/23/2018 4:47:13 PM By: Curtis Sitesorthy, Joanna Signed: 09/23/2018 6:17:11 PM By: Elliot GurneyWoody, BSN, RN, CWS, Kim RN, BSN Entered By: Curtis Sitesorthy, Joanna on 09/23/2018 08:25:15 [name], Pleasant C. ([id number]) -------------------------------------------------------------------------------- Pain Assessment Details Patient Name: [name], Christianjames C. Date of Service: 09/23/2018 8:00 AM Medical Record Number: [id number] Patient Account Number: [id number] Date of Birth/Sex: August 09, 1946 [age] y.o. M) Treating RN: Huel CoventryWoody, Kim Primary Care Vada Swift: Darreld McleanMILES, LINDA Other Clinician: Referring Bellany Elbaum: Darreld McleanMILES, LINDA Treating Dalonte Hardage/Extender: Linwood DibblesSTONE III, HOYT Weeks in Treatment: 140 Active Problems Location of Pain Severity and Description of Pain Patient Has Paino No Site Locations With Dressing Change: No Pain Management and Medication Current Pain Management: Electronic Signature(s) Signed: 09/23/2018 6:17:11 PM By: Elliot GurneyWoody, BSN, RN, CWS, Kim RN, BSN Entered By: Elliot GurneyWoody, BSN, RN, CWS, Kim on 09/23/2018 08:05:54 [name], Tayden C. ([id number]) -------------------------------------------------------------------------------- Patient/Caregiver Education Details Patient Name: [name], Bradlee C. Date of Service: 09/23/2018 8:00 AM Medical Record Number: [id number] Patient Account Number: [id number] Date of Birth/Gender: August 09, 1946 [age] y.o. M) Treating RN: Curtis Sitesorthy, Joanna Primary Care Physician: Darreld McleanMILES, LINDA  Other Clinician: Referring Physician: MILES, LINDA Treating Physician/Extender: Linwood Dibbles, HOYT Weeks in Treatment: 140 Education Assessment Education Provided To: Patient Education Topics Provided Wound/Skin Impairment: Handouts: Other: wound care as ordered Methods: Demonstration, Explain/Verbal Responses: State content correctly Electronic Signature(s) Signed: 09/23/2018  4:47:13 PM By: Curtis Sites Entered By: Curtis Sites on 09/23/2018 08:32:37 [name] ([id number]) -------------------------------------------------------------------------------- Wound Assessment Details Patient Name: [name] Date of Service: 09/23/2018 8:00 AM Medical Record Number: [id number] Patient Account Number: [id number] Date of Birth/Sex: 09-28-1945 (73 y.o. M) Treating RN: Huel Coventry Primary Care Marianita Botkin: [name] Other Clinician: Referring Cheyne Boulden: [name] Treating Dishawn Bhargava/Extender: STONE III, HOYT Weeks in Treatment: 140 Wound Status Wound Number: 1 Primary Venous Leg Ulcer Etiology: Wound Location: Right Malleolus - Lateral Wound Open Wounding Event: Gradually Appeared Status: Date Acquired: 08/11/2015 Comorbid Cataracts, Chronic Obstructive Pulmonary Weeks Of Treatment: 140 History: Disease (COPD), Osteoarthritis Clustered Wound: No Photos Photo Uploaded By: Elliot Gurney, BSN, RN, CWS, Kim on 09/23/2018 08:15:07 Wound Measurements Length: (cm) 4 Width: (cm) 4.2 Depth: (cm) 0.2 Area: (cm) 13.195 Volume: (cm) 2.639 % Reduction in Area: 23.6% % Reduction in Volume: 49.1% Epithelialization: None Tunneling: No Undermining: No Wound Description Full Thickness Without Exposed Support Foul Odo Classification: Structures Slough/F Wound Margin: Flat and Intact Exudate Medium Amount: Exudate Type: Serous Exudate Color: amber r After Cleansing: No ibrino No Wound Bed Granulation Amount: Medium (34-66%) Exposed Structure Granulation Quality: Red Fascia Exposed: No Necrotic Amount: Medium (34-66%) Fat Layer (Subcutaneous Tissue) Exposed: Yes Necrotic Quality: Adherent Slough Tendon Exposed: No Muscle Exposed: No Joint Exposed: No Bone Exposed: No Harpham, Sahej C. ([id number]) Periwound Skin Texture Texture Color No Abnormalities Noted: No No Abnormalities Noted: No Callus: No Atrophie Blanche: No Crepitus:  No Cyanosis: No Excoriation: No Ecchymosis: No Induration: No Erythema: No Rash: No Hemosiderin Staining: No Scarring: Yes Mottled: No Pallor: No Moisture Rubor: No No Abnormalities Noted: No Dry / Scaly: No Temperature / Pain Maceration: No Temperature: No Abnormality Tenderness on Palpation: Yes Wound Preparation Ulcer Cleansing: Rinsed/Irrigated with Saline Topical Anesthetic Applied: Other: lidocaine 4%, Treatment Notes Wound #1 (Right, Lateral Malleolus) Notes Gentamycin, hydrafera blue, ABD and conform Electronic Signature(s) Signed: 09/23/2018 6:17:11 PM By: Elliot Gurney, BSN, RN, CWS, Kim RN, BSN Entered By: Elliot Gurney, BSN, RN, CWS, Kim on 09/23/2018 08:09:56 [name] ([id number]) -------------------------------------------------------------------------------- Wound Assessment Details Patient Name: [name] Date of Service: 09/23/2018 8:00 AM Medical Record Number: [id number] Patient Account Number: [id number] Date of Birth/Sex: 1946/02/15 (73 y.o. M) Treating RN: Huel Coventry Primary Care Gage Treiber: [name] Other Clinician: Referring Azzie Thiem: [name] Treating Maanav Kassabian/Extender: STONE III, HOYT Weeks in Treatment: 140 Wound Status Wound Number: 2 Primary Venous Leg Ulcer Etiology: Wound Location: Right Malleolus - Medial Wound Open Wounding Event: Gradually Appeared Status: Date Acquired: 01/30/2016 Comorbid Cataracts, Chronic Obstructive Pulmonary Weeks Of Treatment: 138 History: Disease (COPD), Osteoarthritis Clustered Wound: Yes Photos Photo Uploaded By: Elliot Gurney, BSN, RN, CWS, Kim on 09/23/2018 08:15:08 Wound Measurements Length: (cm) 2 Width: (cm) 1.8 Depth: (cm) 0.2 Area: (cm) 2.827 Volume: (cm) 0.565 % Reduction in Area: 75.5% % Reduction in Volume: 51.1% Epithelialization: None Wound Description Full Thickness Without Exposed Support Foul Odo Classification: Structures Slough/F Wound Margin: Flat and  Intact Exudate Medium Amount: Exudate Type: Serous Exudate Color: amber r After Cleansing: No ibrino Yes Wound Bed Granulation Amount: None Present (0%) Exposed Structure Necrotic Amount: Large (67-100%) Fascia Exposed: No Necrotic Quality: Adherent Slough Fat Layer (Subcutaneous Tissue) Exposed: Yes Tendon Exposed: No Muscle Exposed: No Joint  Exposed: No Bone Exposed: No Claros, Trevan C. ([id number]) Periwound Skin Texture Texture Color No Abnormalities Noted: No No Abnormalities Noted: No Callus: No Atrophie Blanche: No Crepitus: No Cyanosis: No Excoriation: No Ecchymosis: No Induration: No Erythema: No Rash: No Hemosiderin Staining: No Scarring: Yes Mottled: No Pallor: No Moisture Rubor: No No Abnormalities Noted: No Dry / Scaly: No Temperature / Pain Maceration: No Tenderness on Palpation: Yes Wound Preparation Ulcer Cleansing: Rinsed/Irrigated with Saline Topical Anesthetic Applied: Other: lidocaine 4%, Treatment Notes Wound #2 (Right, Medial Malleolus) Notes Gentamycin, hydrafera blue, ABD and conform Electronic Signature(s) Signed: 09/23/2018 6:17:11 PM By: Elliot Gurney, BSN, RN, CWS, Kim RN, BSN Entered By: Elliot Gurney, BSN, RN, CWS, Kim on 09/23/2018 08:10:20 [name] ([id number]) -------------------------------------------------------------------------------- Vitals Details Patient Name: [name] Date of Service: 09/23/2018 8:00 AM Medical Record Number: [id number] Patient Account Number: [id number] Date of Birth/Sex: 05/17/1946 (73 y.o. M) Treating RN: Curtis Sites Primary Care Rheana Casebolt: [name] Other Clinician: Referring Benuel Ly: [name] Treating Austen Wygant/Extender: STONE III, HOYT Weeks in Treatment: 140 Vital Signs Time Taken: 08:08 Temperature (F): 98.2 Height (in): 76 Pulse (bpm): 82 Weight (lbs): 238 Respiratory Rate (breaths/min): 16 Body Mass Index (BMI): 29 Blood Pressure (mmHg): 143/74 Reference  Range: 80 - 120 mg / dl Airway Electronic Signature(s) Signed: 09/23/2018 4:05:03 PM By: Dayton Martes RCP, RRT, CHT Entered By: Weyman Rodney, Lucio Edward on 09/23/2018 08:09:41

## 2018-10-07 ENCOUNTER — Encounter: Payer: Medicare HMO | Attending: Physician Assistant | Admitting: Physician Assistant

## 2018-10-07 DIAGNOSIS — T85613A Breakdown (mechanical) of artificial skin graft and decellularized allodermis, initial encounter: Secondary | ICD-10-CM | POA: Insufficient documentation

## 2018-10-07 DIAGNOSIS — I87331 Chronic venous hypertension (idiopathic) with ulcer and inflammation of right lower extremity: Secondary | ICD-10-CM | POA: Insufficient documentation

## 2018-10-07 DIAGNOSIS — Y832 Surgical operation with anastomosis, bypass or graft as the cause of abnormal reaction of the patient, or of later complication, without mention of misadventure at the time of the procedure: Secondary | ICD-10-CM | POA: Insufficient documentation

## 2018-10-07 DIAGNOSIS — J449 Chronic obstructive pulmonary disease, unspecified: Secondary | ICD-10-CM | POA: Diagnosis not present

## 2018-10-07 DIAGNOSIS — L97312 Non-pressure chronic ulcer of right ankle with fat layer exposed: Secondary | ICD-10-CM | POA: Insufficient documentation

## 2018-10-12 NOTE — Progress Notes (Signed)
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 for 1 week and I'd like to try and do it for a second week this see if we can get some form of viable surface here. 05/07/17; patient arrives with copious amounts of drainage and some pain in the superior part of the wound. He has not been systemically unwell. He's been changing this daily at home 05/14/17; patient arrives today complaining of less drainage and less pain. Dimensions slightly better. Surface culture I did of this last week grew MSSA I put him on dicloxacillin for 10 days. Patient requesting a further prescription since he feels so much better. He did not obtain the [facility] for reasons that aren't clear, he has been using Hydrofera Blue 05/21/17; perhaps some less drainage and less pain. He is completing another week of doxycycline. Unfortunately there is no change in the wound measurements are appearance still tightly adherent necrotic surface material that is really defied treatment. We have been using Hydrofera Blue most recently. He did not manage to get Anasept. He was told it was prescription at [facility] 05/29/17; absolutely no improvement  in these wound areas. He had remote plastic surgery with who was done at [facility] in [place] surrounding this area. This was a traumatic wound with extensive plastic surgery/skin grafting at that time. I switched him to sorbact last week to see if he can do anything about the recurrent necrotic surface and drainage. This is largely been refractory to Iodoflex/Hydrofera Blue/alginates. I believe we tried Elby Showers and more recently Medi honey l however the drainage is really too excessive 06/04/17; patient went to see Dr. Ulice Bold. Per the patient she ordered him a compression stocking. Continued the Anasept gel and sorbact was already on. No major improvement in the condition of the wounds in fact the medial wound looks larger. Again per the patient she did not feel a skin graft or operative debridement was indicated The patient had previous venous ablation in February 2018. Last arterial studies were in December 2017 and were within normal limits 06/18/17; patient arrives  back in clinic today using Anasept gel with sorbact. He changes this every day is wearing his own compression stocking. The patient actually saw Dr. Leta Baptist of plastic surgery. I've reviewed her note. The appointment was on 05/30/17. The basic issue with here was that she did not feel that skin grafts for patients with underlying stasis and edema have a good long-term success rate. She also discuss skin substitutes which has been done here as well however I have not been able to get the surface of these wounds to something that I think would support skin substitutes. This is why he felt he might need an operative debridement more aggressively than I can do in this clinic Review of systems; is otherwise negative he feels well 07/02/17; patient has been using Anasept gel with Sorbact. He changes this every day scrubs out the wound beds. Arrives today with the wound bed looking some better. Easier debridement 07/16/17;  patient has been using Anasept gel was sorbact for about a month now. The necrotic service on his wound bed is better and the drainage is now down to minimal but we've not made any improvements in the epithelialization. Change to SHAFIN, POLLIO ([id number]) Santyl today. Surface of the wound is still not good enough to support skin substitutes 07/30/17 on evaluation today patient appears to be doing very well in regard to his right bimalleolus wounds. He has been tolerating the treatment with the Anasept gel and sorbact. However we were going to try and switch to Santyl to be more aggressive with these wounds. This was however cost prohibitive. Therefore we will likely go back to the sorbact. Nonetheless he is not having any significant discomfort he still is forming slough over the wound location but nothing too significant. The wounds do appear to be filling in nicely. 08/13/17; I have not seen this wound in about a month. There is not much change. The fact the area medially looks larger. He has been using sorbact and Anasept for over a month now without much effect. Arrives in clinic stating that he does not want the area debrided 08/28/17; patient arrives with the areas on his right medial and right lateral ankle worse. The wounds of expanded there is erythema and still drainage. There is no pain and no tenderness. We had been using Keracel AG clearly not doing well. 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 He states that even the evening that we began utilizing the Iodoflex he started having burning. Subsequently this never really improved and he eventually discontinue the use of the Iodoflex altogether. Unfortunately he  did not let us know about any of this until today. I'm unsure of any specific infection at this point although I am going to obtain a wound culture today in order to see if there's anything that could potentially be causing an infection as well. It does sound as if he's had the Iodoflex before and did not have this kind of reaction although this does sound very specific for a reaction to the Iodoflex. 09/17/17 on evaluation today patient appears to be doing significantly better compared to last week's evaluation. At that time it actually appears that he did have an infection the good news is  that we have been able to get this under control with switching to the [facility] solution he's not having as much pain and discomfort he still does have some erythema surrounding the medial aspect wound. He has been tolerating the Dakin's soaked gauze dressing which is excellent and that his wounds do not seem to have as much adherent slough. Overall I'm pleased with the progress she's made in last week. 11/05/17 on evaluation today patient appears to be doing better in regard to his right medial and lateral malleolus ulcers. He has been tolerating the dressing changes without complication. I do flight the Freada Bergeron is helping with additional granulation at this point in the wound beds do appear to be a little bit better. With that being said patient does not appear to have any evidence of significant infection at this point there is no erythema as he has previously noted he does note that the 30 day course of antibiotics I placed him on will end tomorrow. 11/12/17 on evaluation today patient actually appears to be doing excellent in regard to his right medial and lateral malleolus ulcers. He has been tolerating the dressing changes without complication. Fortunately he has no evidence of infection at this time and overall I believe he is progressing extremely nicely he has a lot of new epithelialization noted on  evaluation today. Patient likewise is very pleased with the progress even just since last week. 11/19/17 on evaluation today patient appears to be doing about the same in regard to his ulcerations. He does not have any additional breakdown although he really has not noted any significant improvement either over the past week. With that being said the more concerning thing at this time is that he is beginning to show signs of erythema surrounding both wounds which is what typically happens and then he will develop into a more overt infection. This has been the course for some time. I hope that doing the 30 day in a biotic cycle this past time would break that so that he would not have the same type of thing happen again. Nonetheless it appears that is digressing back into this infected state unfortunately. With that being said he is not having any pain currently which is good he typically does start to hurt more as this progresses. Fortunately there does not appear to be any evidence of infection systemically which is good news. 11/26/17 on evaluation today patient appears to actually be doing rather well in regard to his ulcer compared to last week. He still has your theme and noted around both the medial and lateral malleolus ulcers of the right lower extremity. He does not seem to be quite as overtly infected however. He has been tolerating the dressing changes without complication which is good news. The gentamicin cream does seem to have been of benefit for him. With that being said he does actually have an appointment with Dr. Sampson Goon this morning to see if there's anything that would be recommended in the way of IV antibiotic therapy. Otherwise he still has slough noted on the surface of the wound. 12/03/17 on evaluation today patient presents for follow-up concerning his ongoing right lower extremity ulcers. He has been tolerating the dressing changes without complication he states he has not  really been using gentamicin on the lateral ankle and this seems to be doing very well. For that reason I think that is probably fine to put a hold on the gentamicin he states his burns and stings when he  applies it and we maybe be able to just use this intermittently when things become more irritated. CATCHER, DEHOYOS ([id number]) Other than that the good news is I did review his MRI which was performed on 11/28/17 and revealed that he has a negative MRI for abscess, osteomyelitis, or septic joint. Obviously these were the issues that I was concerned with the possibility of and I'm pleased to find that there is no problems in that regard. I did also review Dr. Jarrett Ables note from infectious disease he discussed performed some lab work which apparently was done and then subsequently was going to consider the possibility of Keflex long-term for prevention of infection although this has not been prescribed as of yet. 12/09/17 on evaluation today patient appears to be doing a little bit worse in regard to his right medial and lateral malleolus ulcers. Fortunately the wound does not seem to be significantly worse although he does have a lot more drainage especially the lateral aspect he is having a lot more pain than what he has noted more recently. Fortunately there does not appear to be any evidence of systemic infection which is good news. Again he has seen Dr. Sampson Goon but he has not been placed on the potential Keflex which was recommended as a possibility at least yet. 12/17/17 unfortunately on evaluation today the patient's wound appears to be doing much worse. He has been performing the dressing changes as directed. Upon a review of notes in epic it does appear that Dr. Jarrett Ables office specifically sending Keflex for the patient on the 16th which was last Tuesday. With that being said the patient states that though this was sent into [facility] that [facility] stated they never received it and  subsequently the patient never took anything. He tells me he has been in bed since Friday due to the increased pain and overall general malaise. No fevers, chills, nausea, or vomiting noted at this time. 02/25/18 on evaluation today patient appears to be doing rather well in regard to his bilateral right malleolus ulcers. He has been tolerating the dressing changes without complication. Currently there does not appear to be any evidence of infection. Overall I'm very pleased with the progress that seems to been made up to this point. 03/11/18 on evaluation today patient actually appears to be doing very well in regard to his right ankle ulcers. He's been tolerating the dressing changes without complication. Fortunately there does not appear to be any evidence of sniffing infection I do believe that the gentamicin cream continues to be of benefit for him. Fortunately he is showing signs of healing in which time I see him. He also tells me he's having no pain. 03/25/18 on evaluation today patient actually appears to show signs of improvement especially in regard to the right lateral ankle ulcer. He has been tolerating the dressing changes without complication. In general I'm very pleased with the progress that has been made up to this point. 04/08/18- He presents in follow up evaluation for right bimalleolar wounds; there is essentially no change. He admits to removing the dressing at night to allow the wounds to be "air out". We will continue with same treatment plan and he will follow up in two weeks 04/22/18 on evaluation today patient seems very frustrated with this situation currently as far as the ulcer on his right medial and lateral malleolus is concerned. He states this has been going on a very long time and obviously he does have a lot of bills as  well is far as wound care is concerned. With that being said he tells me that he's been tolerating the dressing changes well although he did clarify  that what he is doing is putting the medicine on in the morning after shower and then subsequently he takes the dressing off at night leaving it open to air. I previously asked him about this and I was not aware that he was doing this. Nonetheless that may be slowing things down a bit at this point. Fortunately there does not appear to be any evidence of severe infection although he does have some evidence of your theme is surrounding the wound bed. He is still using the gentamicin cream. 05/06/18 on evaluation today patient actually presents for evaluation of his right medial malleolus ulcers. He's been tolerating the dressing changes without complication. With that being said it does appear that he is doing better compared to last evaluation. He's not having the pain like he did previous. He also states that blisters that he had coming up when I saw him last did resolve and ended up doing okay he did go to the ER they gave him some cream to put on it he's not sure what the name of the cream was. He was down in [facility] when he called me and I spoke with him previous. Nonetheless he states that they told him to put the medicine on his our due list. With that being said I'm not really 100% sure that the medicine had anything to do with this due to the fact that again he was having the blisters when he came into the office which is why we actually put them on the anabiotic I was concerned about him having an infection which was causing the blistering and increased pain in his extremity. Nonetheless this is something that we can definitely be cautious of in the future it was Bactrim that we had him on. Overall I'm glad he is doing better. 05/20/18 on evaluation today patient actually appears to be doing fairly well in regard to his ulcer on the right lateral and medial malleolus locations. Both seem to be doing decently well he does have slough building up on the surface of the wound. He has been  using the Hibiclense as I instructed him. Overall there's no evidence of significant infection which is good news. CHINMAY, DOTHARD ([id number]) 06/02/18 on evaluation today patient actually appears to be doing excellent in regard to his right medial and lateral malleolus ulcers. He has been tolerating the dressing changes without complication. There does not appear to be any evidence of infection at this time which is great news. In general I feel like he has made great progress in the past several weeks. He has good granulation noted over areas that have not had good granulation previously and epithelialization even more so in several areas. Both wounds measured smaller. 06/17/18 an evaluation today patient actually appears to be doing excellent in regard to his right lateral and medial malleolus ulcers. He has been tolerating the dressing changes without complication fortunately the Hydrofera Blue Dressing to be doing excellent for him. He's actually making progress each time I see him. 07/08/18 on evaluation today patient's wounds on the right medial and lateral malleolus or region show signs of improvement currently. He has been tolerating the dressing changes without complication at this time. He does feel like the [facility] Dressing has been of benefit for him. No fevers, chills, nausea, or vomiting noted  at this time. 07/22/18 and evaluation today patient actually appears to be doing excellent in regard to his right medial and lateral malleolus ulcers. He's been tolerating the dressing changes without complication the Hydrofera Blue Dressing years be excellent for him. Overall I'm extremely pleased in this regard. 08/05/18 on evaluation today patient's right lateral and medial malleolus ulcers appear to be doing well at this point. There does not appear to be any evidence of infection which is good news. Overall he does have some Slough noted which is going to require sharp  debridement but other than this I see no current issues 08/19/18 on evaluation today patient actually appears to be doing better yet again in regard to his lower [place] ulcer on the right medial and lateral malleolus regions. He is having no pain and no significant signs of infection which is great news. Overall things seem to be progressing quite nicely which is great news. No fevers, chills, nausea, or vomiting noted at this time. 09/09/18 on evaluation today patient appears to be doing very well in regard to his medial and lateral ankle ulcers. He's been tolerating the dressing changes without complication. Fortunately there is no evidence of infection at this time which is good news. No fevers, chills, nausea, or vomiting noted at this time. 09/23/18 on evaluation today patient appears to be doing rather well in regard to his right medial and lateral ankle ulcers. He is been tolerating the dressing changes and still things continue to do excellent. Overall very pleased with the progress and there's no signs of infection today. 10/07/18 on evaluation today patient appears to be doing very well in regard to his right medial and lateral ankle ulcers. He's been tolerating the dressing changes without complication. Fortunately there's no signs of infection at this time. Overall the patient states that he is very pleased with how things seem to be progressing. Electronic Signature(s) Signed: 10/12/2018 7:15:48 AM By: Lenda Kelp PA-C Entered By: Lenda Kelp on 10/07/2018 08:41:42 [name] ([id number]) -------------------------------------------------------------------------------- Physical Exam Details Patient Name: [name] Date of Service: 10/07/2018 8:00 AM Medical Record Number: [id number] Patient Account Number: [id number] Date of Birth/Sex: Dec 16, 1945 (73 y.o. M) Treating RN: Curtis Sites Primary Care Provider: [name] Other Clinician: Referring Provider:  [name] Treating Provider/Extender: STONE III, Melanee Cordial Weeks in Treatment: 142 Constitutional Well-nourished and well-hydrated in no acute distress. Respiratory normal breathing without difficulty. Psychiatric this patient is able to make decisions and demonstrates good insight into disease process. Alert and Oriented x 3. pleasant and cooperative. Notes Patient's wound bed currently shows signs of good granulation at this time. Fortunately there is no evidence of infection currently which is also good news. There was some Slough noted that did require sharp debridement this was performed lightly over the surface of the wound clearing away the slough. All in all he seems to be making excellent progress which is wonderful news and very pleased with how things have been progressing over the past several months. Electronic Signature(s) Signed: 10/12/2018 7:15:48 AM By: Lenda Kelp PA-C Entered By: Lenda Kelp on 10/07/2018 08:42:12 [name] ([id number]) -------------------------------------------------------------------------------- Physician Orders Details Patient Name: [name] Date of Service: 10/07/2018 8:00 AM Medical Record Number: [id number] Patient Account Number: [id number] Date of Birth/Sex: [birth year] (73 y.o. 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 o May Shower, gently pat wound dry prior to applying new dressing. - wash with hibiclens daily and leave on 5 mins Wound #2 Right,Medial Malleolus o Clean wound with Normal Saline. o May Shower, gently pat wound dry prior to applying new dressing. - wash with hibiclens and leave on 5 mins Anesthetic (add to Medication List) Wound #1 Right,Lateral Malleolus o Topical Lidocaine 4% cream applied to wound bed prior to debridement (In Clinic Only). Wound #2 Right,Medial Malleolus o Topical Lidocaine 4% cream applied to wound bed prior to debridement (In Clinic Only). Primary Wound Dressing Wound #1 Right,Lateral Malleolus o Hydrafera Blue Ready Transfer o Other: - Gentamicin cream to wound bed Wound #2 Right,Medial Malleolus o Hydrafera Blue Ready Transfer o Other: - Gentamicin cream to wound bed Secondary Dressing Wound #1 Right,Lateral Malleolus o Dry Gauze o Conform/Kerlix Wound #2 Right,Medial Malleolus o Dry Gauze o Conform/Kerlix Dressing Change Frequency Renfrow, Alastor C. ([id number]) Wound #1 Right,Lateral Malleolus o Change dressing every other day. Wound #2 Right,Medial Malleolus o Change dressing every other day. Follow-up Appointments Wound #1 Right,Lateral Malleolus o Return Appointment in 2 weeks. Wound #2 Right,Medial Malleolus o Return Appointment in 2 weeks. Edema Control Wound #1 Right,Lateral Malleolus o Elevate legs to the level of the heart and pump ankles as often as possible Wound #2 Right,Medial Malleolus o Elevate legs to the level of the heart and pump ankles as often as possible Additional Orders / Instructions Wound #1 Right,Lateral Malleolus o Increase protein intake. Wound #2 Right,Medial Malleolus o Increase protein intake. Electronic Signature(s) Signed: 10/07/2018 4:52:44 PM By: Curtis Sites Signed: 10/12/2018 7:15:48  AM By: Lenda Kelp PA-C Entered By: Curtis Sites on 10/07/2018 08:39:35 [name] ([id number]) -------------------------------------------------------------------------------- Problem List Details Patient Name: [name] Date of Service: 10/07/2018 8:00 AM Medical Record Number: [id number] Patient Account Number: [id number] Date of Birth/Sex: [birth year] (73 y.o. 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 M) Treating RN: Curtis Sites Primary Care Provider: [name] Other Clinician: Referring Provider: [name] Treating Provider/Extender: Linwood Dibbles,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 He states that even the evening that we began utilizing the Iodoflex he started having burning. Subsequently this never really improved and he eventually discontinue the use of the Iodoflex altogether. Unfortunately he did not let us know about any of this until today. I'm unsure of any specific infection at this point although I am going to obtain a wound culture today in order to see if there's anything that could potentially be causing an infection as well. It does sound as if he's had the Iodoflex before and did not have this kind of reaction although this does sound very specific for a reaction to the Iodoflex. 09/17/17 on evaluation today patient appears to be doing significantly better compared to last week's evaluation. At that time it actually appears that he did have an infection the good news is that we have been able to get this under control with switching to the [facility] solution he's not having as much pain and discomfort he still does have some erythema surrounding the medial aspect wound. He has been tolerating the Dakin's soaked gauze  dressing which is excellent and that his wounds do not seem to have as much adherent slough. Overall I'm pleased with the progress she's made in last  week. 11/05/17 on evaluation today patient appears to be doing better in regard to his right medial and lateral malleolus ulcers. He has been tolerating the dressing changes without complication. I do flight the Freada Bergeron is helping with additional granulation at this point in the wound beds do appear to be a little bit better. With that being said patient does not appear to have any evidence of significant infection at this point there is no erythema as he has previously noted he does note that the 30 day course of antibiotics I placed him on will end tomorrow. 11/12/17 on evaluation today patient actually appears to be doing excellent in regard to his right medial and lateral malleolus ulcers. He has been tolerating the dressing changes without complication. Fortunately he has no evidence of infection at this time and overall I believe he is progressing extremely nicely he has a lot of new epithelialization noted on evaluation today. Patient likewise is very pleased with the progress even just since last week. 11/19/17 on evaluation today patient appears to be doing about the same in regard to his ulcerations. He does not have any additional breakdown although he really has not noted any significant improvement either over the past week. With that being said the more concerning thing at this time is that he is beginning to show signs of erythema surrounding both wounds which is what typically happens and then he will develop into a more overt infection. This has been the course for some time. I hope that doing the 30 day in a biotic cycle this past time would break that so that he would not have the same type of thing happen again. Nonetheless it appears that is digressing back into this infected state unfortunately. With that being said he is not having any pain currently which is good he typically does start to hurt more as this progresses. Fortunately there does not appear to be any evidence of  infection systemically which is good news. 11/26/17 on evaluation today patient appears to actually be doing rather well in regard to his ulcer compared to last week. He still has your theme and noted around both the medial and lateral malleolus ulcers of the right lower extremity. He does not seem to be quite as overtly infected however. He has been tolerating the dressing changes without complication which is good news. The gentamicin cream does seem to have been of benefit for him. With that being said he does actually have an appointment with Dr. Sampson Goon this morning to see if there's anything that would be recommended in the way of IV antibiotic Hittle, Gladys C. ([id number]) therapy. Otherwise he still has slough noted on the surface of the wound. 12/03/17 on evaluation today patient presents for follow-up concerning his ongoing right lower extremity ulcers. He has been tolerating the dressing changes without complication he states he has not really been using gentamicin on the lateral ankle and this seems to be doing very well. For that reason I think that is probably fine to put a hold on the gentamicin he states his burns and stings when he applies it and we maybe be able to just use this intermittently when things become more irritated. Other than that the good news is I did review his MRI which was performed on 11/28/17 and revealed that he has  a negative MRI for abscess, osteomyelitis, or septic joint. Obviously these were the issues that I was concerned with the possibility of and I'm pleased to find that there is no problems in that regard. I did also review Dr. Jarrett Ables note from infectious disease he discussed performed some lab work which apparently was done and then subsequently was going to consider the possibility of Keflex long-term for prevention of infection although this has not been prescribed as of yet. 12/09/17 on evaluation today patient appears to be doing a little bit  worse in regard to his right medial and lateral malleolus ulcers. Fortunately the wound does not seem to be significantly worse although he does have a lot more drainage especially the lateral aspect he is having a lot more pain than what he has noted more recently. Fortunately there does not appear to be any evidence of systemic infection which is good news. Again he has seen Dr. Sampson Goon but he has not been placed on the potential Keflex which was recommended as a possibility at least yet. 12/17/17 unfortunately on evaluation today the patient's wound appears to be doing much worse. He has been performing the dressing changes as directed. Upon a review of notes in epic it does appear that Dr. Jarrett Ables office specifically sending Keflex for the patient on the 16th which was last Tuesday. With that being said the patient states that though this was sent into [facility] that [facility] stated they never received it and subsequently the patient never took anything. He tells me he has been in bed since Friday due to the increased pain and overall general malaise. No fevers, chills, nausea, or vomiting noted at this time. 02/25/18 on evaluation today patient appears to be doing rather well in regard to his bilateral right malleolus ulcers. He has been tolerating the dressing changes without complication. Currently there does not appear to be any evidence of infection. Overall I'm very pleased with the progress that seems to been made up to this point. 03/11/18 on evaluation today patient actually appears to be doing very well in regard to his right ankle ulcers. He's been tolerating the dressing changes without complication. Fortunately there does not appear to be any evidence of sniffing infection I do believe that the gentamicin cream continues to be of benefit for him. Fortunately he is showing signs of healing in which time I see him. He also tells me he's having no pain. 03/25/18 on evaluation today  patient actually appears to show signs of improvement especially in regard to the right lateral ankle ulcer. He has been tolerating the dressing changes without complication. In general I'm very pleased with the progress that has been made up to this point. 04/08/18- He presents in follow up evaluation for right bimalleolar wounds; there is essentially no change. He admits to removing the dressing at night to allow the wounds to be "air out". We will continue with same treatment plan and he will follow up in two weeks 04/22/18 on evaluation today patient seems very frustrated with this situation currently as far as the ulcer on his right medial and lateral malleolus is concerned. He states this has been going on a very long time and obviously he does have a lot of bills as well is far as wound care is concerned. With that being said he tells me that he's been tolerating the dressing changes well although he did clarify that what he is doing is putting the medicine on in the morning after shower  and then subsequently he takes the dressing off at night leaving it open to air. I previously asked him about this and I was not aware that he was doing this. Nonetheless that may be slowing things down a bit at this point. Fortunately there does not appear to be any evidence of severe infection although he does have some evidence of your theme is surrounding the wound bed. He is still using the gentamicin cream. 05/06/18 on evaluation today patient actually presents for evaluation of his right medial malleolus ulcers. He's been tolerating the dressing changes without complication. With that being said it does appear that he is doing better compared to last evaluation. He's not having the pain like he did previous. He also states that blisters that he had coming up when I saw him last did resolve and ended up doing okay he did go to the ER they gave him some cream to put on it he's not sure what the name of the  cream was. He was down in [facility] when he called me and I spoke with him previous. Nonetheless he states that they told him to put the medicine on his our due list. With that being said I'm not really 100% sure that the medicine had anything to do with this due to the fact that again he was having the blisters when he came into the office which is why we actually put them on the anabiotic I was concerned about him having an infection which was causing the blistering and increased pain in his extremity. Nonetheless this is something that we can definitely be cautious of in the future it was Bactrim Auston, Jaydan C. ([id number]) that we had him on. Overall I'm glad he is doing better. 05/20/18 on evaluation today patient actually appears to be doing fairly well in regard to his ulcer on the right lateral and medial malleolus locations. Both seem to be doing decently well he does have slough building up on the surface of the wound. He has been using the Hibiclense as I instructed him. Overall there's no evidence of significant infection which is good news. 06/02/18 on evaluation today patient actually appears to be doing excellent in regard to his right medial and lateral malleolus ulcers. He has been tolerating the dressing changes without complication. There does not appear to be any evidence of infection at this time which is great news. In general I feel like he has made great progress in the past several weeks. He has good granulation noted over areas that have not had good granulation previously and epithelialization even more so in several areas. Both wounds measured smaller. 06/17/18 an evaluation today patient actually appears to be doing excellent in regard to his right lateral and medial malleolus ulcers. He has been tolerating the dressing changes without complication fortunately the Hydrofera Blue Dressing to be doing excellent for him. He's actually making progress each time I see  him. 07/08/18 on evaluation today patient's wounds on the right medial and lateral malleolus or region show signs of improvement currently. He has been tolerating the dressing changes without complication at this time. He does feel like the [facility] Dressing has been of benefit for him. No fevers, chills, nausea, or vomiting noted at this time. 07/22/18 and evaluation today patient actually appears to be doing excellent in regard to his right medial and lateral malleolus ulcers. He's been tolerating the dressing changes without complication the Hydrofera Blue Dressing years be excellent for him. Overall  I'm extremely pleased in this regard. 08/05/18 on evaluation today patient's right lateral and medial malleolus ulcers appear to be doing well at this point. There does not appear to be any evidence of infection which is good news. Overall he does have some Slough noted which is going to require sharp debridement but other than this I see no current issues 08/19/18 on evaluation today patient actually appears to be doing better yet again in regard to his lower Trinity ulcer on the right medial and lateral malleolus regions. He is having no pain and no significant signs of infection which is great news. Overall things seem to be progressing quite nicely which is great news. No fevers, chills, nausea, or vomiting noted at this time. 09/09/18 on evaluation today patient appears to be doing very well in regard to his medial and lateral ankle ulcers. He's been tolerating the dressing changes without complication. Fortunately there is no evidence of infection at this time which is good news. No fevers, chills, nausea, or vomiting noted at this time. 09/23/18 on evaluation today patient appears to be doing rather well in regard to his right medial and lateral ankle ulcers. He is been tolerating the dressing changes and still things continue to do excellent. Overall very pleased with the progress  and there's no signs of infection today. 10/07/18 on evaluation today patient appears to be doing very well in regard to his right medial and lateral ankle ulcers. He's been tolerating the dressing changes without complication. Fortunately there's no signs of infection at this time. Overall the patient states that he is very pleased with how things seem to be progressing. Patient History Information obtained from Patient. Social History Former smoker - 10 years ago, Marital Status - Married, Alcohol Use - Moderate, Drug Use - No History, Caffeine Use - Never. Medical History Eyes Patient has history of Cataracts - removed Denies history of Glaucoma, Optic Neuritis Ear/Nose/Mouth/Throat Denies history of Chronic sinus problems/congestion Hematologic/Lymphatic GLEEN, RIPBERGER ([id number]) Denies history of Anemia, Hemophilia, Human Immunodeficiency Virus, Lymphedema, Sickle Cell Disease Respiratory Patient has history of Chronic Obstructive Pulmonary Disease (COPD) Denies history of Aspiration, Asthma, Pneumothorax, Sleep Apnea, Tuberculosis Cardiovascular Denies history of Angina, Arrhythmia, Congestive Heart Failure, Coronary Artery Disease, Deep Vein Thrombosis, Hypertension, Hypotension, Myocardial Infarction, Peripheral Arterial Disease, Peripheral Venous Disease, Phlebitis, Vasculitis Gastrointestinal Denies history of Cirrhosis , Colitis, Crohn s, Hepatitis A, Hepatitis B, Hepatitis C Endocrine Denies history of Type I Diabetes, Type II Diabetes Genitourinary Denies history of End Stage Renal Disease Immunological Denies history of Lupus Erythematosus, Raynaud s Integumentary (Skin) Denies history of History of Burn, History of pressure wounds Musculoskeletal Patient has history of Osteoarthritis Denies history of Gout, Rheumatoid Arthritis, Osteomyelitis Neurologic Denies history of Dementia, Neuropathy, Quadriplegia, Paraplegia, Seizure Disorder Oncologic Denies  history of Received Chemotherapy, Received Radiation Psychiatric Denies history of Anorexia/bulimia, Confinement Anxiety Medical And Surgical History Notes Constitutional Symptoms (General Health) Vein Filter (groin area); CODA; H/O Blood Clots; pulmonary hypertensive arterial disease Review of Systems (ROS) Constitutional Symptoms (General Health) Denies complaints or symptoms of Fever, Chills. Respiratory The patient has no complaints or symptoms. Cardiovascular The patient has no complaints or symptoms. Psychiatric The patient has no complaints or symptoms. Objective Constitutional Well-nourished and well-hydrated in no acute distress. Vitals Time Taken: 8:02 AM, Height: 76 in, Weight: 238 lbs, BMI: 29, Temperature: 98.5 F, Pulse: 90 bpm, Respiratory Rate: 16 breaths/min, Blood Pressure: 152/78 mmHg. ELIMELECH, HOUSEMAN ([id number]) Respiratory normal breathing without difficulty. Psychiatric this patient is  able to make decisions and demonstrates good insight into disease process. Alert and Oriented x 3. pleasant and cooperative. General Notes: Patient's wound bed currently shows signs of good granulation at this time. Fortunately there is no evidence of infection currently which is also good news. There was some Slough noted that did require sharp debridement this was performed lightly over the surface of the wound clearing away the slough. All in all he seems to be making excellent progress which is wonderful news and very pleased with how things have been progressing over the past several months. Integumentary (Hair, Skin) Wound #1 status is Open. Original cause of wound was Gradually Appeared. The wound is located on the Right,Lateral Malleolus. The wound measures 5cm length x 3.6cm width x 0.1cm depth; 14.137cm^2 area and 1.414cm^3 volume. There is Fat Layer (Subcutaneous Tissue) Exposed exposed. There is no tunneling or undermining noted. There is a medium amount of  serous drainage noted. The wound margin is flat and intact. There is medium (34-66%) red granulation within the wound bed. There is a medium (34-66%) amount of necrotic tissue within the wound bed including Adherent Slough. The periwound skin appearance exhibited: Scarring, Hemosiderin Staining. The periwound skin appearance did not exhibit: Callus, Crepitus, Excoriation, Induration, Rash, Dry/Scaly, Maceration, Atrophie Blanche, Cyanosis, Ecchymosis, Mottled, Pallor, Rubor, Erythema. Periwound temperature was noted as No Abnormality. The periwound has tenderness on palpation. Wound #2 status is Open. Original cause of wound was Gradually Appeared. The wound is located on the Right,Medial Malleolus. The wound measures 2cm length x 1.5cm width x 0.2cm depth; 2.356cm^2 area and 0.471cm^3 volume. There is Fat Layer (Subcutaneous Tissue) Exposed exposed. There is no tunneling or undermining noted. There is a medium amount of serous drainage noted. The wound margin is flat and intact. There is no granulation within the wound bed. There is a large (67-100%) amount of necrotic tissue within the wound bed including Adherent Slough. The periwound skin appearance exhibited: Scarring. The periwound skin appearance did not exhibit: Callus, Crepitus, Excoriation, Induration, Rash, Dry/Scaly, Maceration, Atrophie Blanche, Cyanosis, Ecchymosis, Hemosiderin Staining, Mottled, Pallor, Rubor, Erythema. Periwound temperature was noted as No Abnormality. The periwound has tenderness on palpation. 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 There was a Excisional Skin/Subcutaneous Tissue Debridement with a total area of 18 sq cm performed by STONE III, Jazz Rogala E., PA-C. With the following instrument(s): Curette to remove Viable and Non-Viable tissue/material. Material removed includes Subcutaneous Tissue and Slough and after achieving pain control using Lidocaine 4% Topical Solution. No specimens were taken. A time out was conducted at 08:31, prior to the start of the procedure. A Bumpus, Mahlik C. ([id number]) Minimum amount of bleeding was controlled with Pressure. The procedure was tolerated well with a pain level of 0 throughout and a pain level of 0 following the procedure. Post Debridement Measurements: 5cm length x 3.6cm width x 0.2cm depth; 2.827cm^3 volume. Character of Wound/Ulcer Post Debridement is improved. Severity of Tissue Post Debridement is: Fat layer exposed. Post procedure Diagnosis Wound #1: Same as Pre-Procedure Wound #2 Pre-procedure diagnosis of Wound #2 is a Venous Leg Ulcer located on the  Right,Medial Malleolus .Severity of Tissue Pre Debridement is: Fat layer exposed. There was a Excisional Skin/Subcutaneous Tissue Debridement with a total area of 3 sq cm performed by STONE III, Cumi Sanagustin E., PA-C. With the following instrument(s): Curette to remove Viable and Non-Viable tissue/material. Material removed includes Subcutaneous Tissue and Slough and after achieving pain control using Lidocaine 4% Topical Solution. No specimens were taken. A time out was conducted at 08:35, prior to the start of the procedure. A Minimum amount of bleeding was controlled with Pressure. The procedure was tolerated well with a pain level of 0 throughout and a pain level of 0 following the procedure. Post Debridement Measurements: 2cm length x 1.5cm width x 0.3cm depth; 0.707cm^3 volume. Character of Wound/Ulcer Post  Debridement is improved. Severity of Tissue Post Debridement is: Fat layer exposed. Post procedure Diagnosis Wound #2: Same as Pre-Procedure Plan Wound Cleansing: Wound #1 Right,Lateral Malleolus: Clean wound with Normal Saline. May Shower, gently pat wound dry prior to applying new dressing. - wash with hibiclens daily and leave on 5 mins Wound #2 Right,Medial Malleolus: Clean wound with Normal Saline. May Shower, gently pat wound dry prior to applying new dressing. - wash with hibiclens and leave on 5 mins Anesthetic (add to Medication List): Wound #1 Right,Lateral Malleolus: Topical Lidocaine 4% cream applied to wound bed prior to debridement (In Clinic Only). Wound #2 Right,Medial Malleolus: Topical Lidocaine 4% cream applied to wound bed prior to debridement (In Clinic Only). Primary Wound Dressing: Wound #1 Right,Lateral Malleolus: Hydrafera Blue Ready Transfer Other: - Gentamicin cream to wound bed Wound #2 Right,Medial Malleolus: Hydrafera Blue Ready Transfer Other: - Gentamicin cream to wound bed Secondary Dressing: Wound #1 Right,Lateral Malleolus: Dry Gauze Conform/Kerlix Wound #2 Right,Medial Malleolus: Dry Gauze Conform/Kerlix Dressing Change Frequency: Wound #1 Right,Lateral Malleolus: Change dressing every other day. Wound #2 Right,Medial Malleolus: Change dressing every other day. Follow-up Appointments: Wound #1 Right,Lateral Malleolus: CELESTER, LECH ([id number]) Return Appointment in 2 weeks. Wound #2 Right,Medial Malleolus: Return Appointment in 2 weeks. Edema Control: Wound #1 Right,Lateral Malleolus: Elevate legs to the level of the heart and pump ankles as often as possible Wound #2 Right,Medial Malleolus: Elevate legs to the level of the heart and pump ankles as often as possible Additional Orders / Instructions: Wound #1 Right,Lateral Malleolus: Increase protein intake. Wound #2 Right,Medial Malleolus: Increase protein intake. My  suggestion currently is gonna be that we continue with the above wound care measures for the next week. Patient is in agreement with plan. We will subsequently see were things stand at follow-up. Please see above for specific wound care orders. We will see patient for re-evaluation in 1 week(s) here in the clinic. If anything worsens or changes patient will contact our office for additional recommendations. Electronic Signature(s) Signed: 10/12/2018 7:15:48 AM By: Lenda Kelp PA-C Entered By: Lenda Kelp on 10/07/2018 08:42:27 [name] ([id number]) -------------------------------------------------------------------------------- ROS/PFSH Details Patient Name: [name] Date of Service: 10/07/2018 8:00 AM Medical Record Number: [id number] Patient Account Number: [id number] Date of Birth/Sex: 27-Nov-1945 ([id number] y.o. M) Treating RN: Curtis Sites Primary Care Provider: [name] Other Clinician: Referring Provider: [name] Treating Provider/Extender: STONE III, Rosina Cressler Weeks in Treatment: 142 Information Obtained From Patient Wound History Do you currently have one or more open woundso Yes How many open wounds do you currently haveo 1 Approximately how long have you had your woundso 5 months How have you been treating your wound(s) until nowo saline, dressing Has your wound(s) ever healed and then  re-openedo Yes Have you had any lab work done in the past montho No Have you tested positive for an antibiotic resistant organism (MRSA, VRE)o No Have you tested positive for osteomyelitis (bone infection)o No Have you had any tests for circulation on your legso No Constitutional Symptoms (General Health) Complaints and Symptoms: Negative for: Fever; Chills Medical History: Past Medical History Notes: Vein Filter (groin area); CODA; H/O Blood Clots; pulmonary hypertensive arterial disease Eyes Medical History: Positive for: Cataracts - removed Negative for:  Glaucoma; Optic Neuritis Ear/Nose/Mouth/Throat Medical History: Negative for: Chronic sinus problems/congestion Hematologic/Lymphatic Medical History: Negative for: Anemia; Hemophilia; Human Immunodeficiency Virus; Lymphedema; Sickle Cell Disease Respiratory Complaints and Symptoms: No Complaints or Symptoms Medical History: Positive for: Chronic Obstructive Pulmonary Disease (COPD) Negative for: Aspiration; Asthma; Pneumothorax; Sleep Apnea; Tuberculosis Cardiovascular [name] StackBLACKWELL, Salathiel C. ([id number]) Complaints and Symptoms: No Complaints or Symptoms Medical History: Negative for: Angina; Arrhythmia; Congestive Heart Failure; Coronary Artery Disease; Deep Vein Thrombosis; Hypertension; Hypotension; Myocardial Infarction; Peripheral Arterial Disease; Peripheral Venous Disease; Phlebitis; Vasculitis Gastrointestinal Medical History: Negative for: Cirrhosis ; Colitis; Crohnos; Hepatitis A; Hepatitis B; Hepatitis C Endocrine Medical History: Negative for: Type I Diabetes; Type II Diabetes Genitourinary Medical History: Negative for: End Stage Renal Disease Immunological Medical History: Negative for: Lupus Erythematosus; Raynaudos Integumentary (Skin) Medical History: Negative for: History of Burn; History of pressure wounds Musculoskeletal Medical History: Positive for: Osteoarthritis Negative for: Gout; Rheumatoid Arthritis; Osteomyelitis Neurologic Medical History: Negative for: Dementia; Neuropathy; Quadriplegia; Paraplegia; Seizure Disorder Oncologic Medical History: Negative for: Received Chemotherapy; Received Radiation Psychiatric Complaints and Symptoms: No Complaints or Symptoms Medical History: Negative for: Anorexia/bulimia; Confinement Anxiety HBO Extended History Items [name] StackBLACKWELL, Marcello C. ([id number]) Eyes: Cataracts Immunizations Pneumococcal Vaccine: Received Pneumococcal Vaccination: No Implantable Devices Family and Social History Former  smoker - 10 years ago; Marital Status - Married; Alcohol Use: Moderate; Drug Use: No History; Caffeine Use: Never; Advanced Directives: No; Patient does not want information on Advanced Directives; Living Will: No; Medical Power of Attorney: No Physician Affirmation I have reviewed and agree with the above information. Electronic Signature(s) Signed: 10/07/2018 4:52:44 PM By: Curtis Sitesorthy, Joanna Signed: 10/12/2018 7:15:48 AM By: Lenda KelpStone III, Halcyon Heck PA-C Entered By: Lenda KelpStone III, Treyshaun Keatts on 10/07/2018 08:41:55 [name] StackBLACKWELL, Jayzon C. ([id number]) -------------------------------------------------------------------------------- SuperBill Details Patient Name: [name] StackBLACKWELL, Chasen C. Date of Service: 10/07/2018 Medical Record Number: [id number] Patient Account Number: [id number] Date of Birth/Sex: 11/03/1945 [age] y.o. 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, sequela T81.31XS Disruption of external operation (surgical) wound, not elsewhere classified, sequela Facility Procedures CPT4 Code: 6295284136100012 Description: 11042 - DEB SUBQ TISSUE 20 SQ CM/< ICD-10 Diagnosis Description L97.312 Non-pressure chronic ulcer of right ankle with fat layer ex Modifier: posed Quantity: 1 CPT4 Code: 3244010236100018 Description: 11045 - DEB SUBQ TISS EA ADDL 20CM ICD-10 Diagnosis Description L97.312 Non-pressure chronic ulcer of right ankle with fat layer ex Modifier: posed Quantity: 1 Physician Procedures CPT4 Code: 72536646770168 Description: 11042 - WC PHYS SUBQ TISS 20 SQ CM ICD-10 Diagnosis  Description L97.312 Non-pressure chronic ulcer of right ankle with fat layer exp Modifier: osed Quantity: 1 CPT4 Code: [id number] Description: 11045 - WC PHYS SUBQ TISS EA ADDL 20 CM ICD-10 Diagnosis Description L97.312 Non-pressure  chronic ulcer of right ankle with fat layer exp Modifier: osed Quantity: 1 Electronic Signature(s) Signed: 10/12/2018 7:15:48 AM By: Lenda Kelp PA-C Entered By: Lenda Kelp on 10/07/2018 08:42:46

## 2018-10-12 NOTE — Progress Notes (Signed)
HUEY, NATHE ([id number]) Visit Report for 10/07/2018 Arrival Information Details Patient Name: [name], [name]. Date of Service: 10/07/2018 8:00 AM Medical Record Number: [id number] Patient Account Number: [id number] Date of Birth/Sex: 01/25/1946 (73 y.o. M) Treating RN: Rodell Perna Primary Care Valery Chance: [name] Other Clinician: Referring Marlyn Rabine: [name] Treating Sevyn Paredez/Extender: Linwood Dibbles, HOYT Weeks in Treatment: 142 Visit Information History Since Last Visit Added or deleted any medications: No Patient Arrived: Ambulatory Any new allergies or adverse reactions: No Arrival Time: 08:07 Had a fall or experienced change in No Accompanied By: self activities of daily living that may affect Transfer Assistance: None risk of falls: Patient Identification Verified: Yes Signs or symptoms of abuse/neglect since last visito No Patient Requires Transmission-Based No Hospitalized since last visit: No Precautions: Pain Present Now: No Patient Has Alerts: Yes Patient Alerts: Patient on Blood Thinner Electronic Signature(s) Signed: 10/07/2018 4:35:39 PM By: Rodell Perna Entered By: Rodell Perna on 10/07/2018 08:07:58 [name] ([id number]) -------------------------------------------------------------------------------- Encounter Discharge Information Details Patient Name: [name] Date of Service: 10/07/2018 8:00 AM Medical Record Number: [id number] Patient Account Number: [id number] Date of Birth/Sex: 1945-11-20 (73 y.o. M) Treating RN: Curtis Sites Primary Care Particia Strahm: [name] Other Clinician: Referring Jaice Digioia: [name] Treating Sugey Trevathan/Extender: Linwood Dibbles, HOYT Weeks in Treatment: 142 Encounter Discharge Information Items Post Procedure Vitals Discharge Condition: Stable Temperature (F): 98.5 Ambulatory Status: Ambulatory Pulse (bpm): 90 Discharge Destination: Home Respiratory Rate (breaths/min): 18 Transportation:  Private Auto Blood Pressure (mmHg): 152/78 Accompanied By: self Schedule Follow-up Appointment: Yes Clinical Summary of Care: Electronic Signature(s) Signed: 10/07/2018 4:52:44 PM By: Curtis Sites Entered By: Curtis Sites on 10/07/2018 09:01:27 [name] ([id number]) -------------------------------------------------------------------------------- Lower Extremity Assessment Details Patient Name: [name] Date of Service: 10/07/2018 8:00 AM Medical Record Number: [id number] Patient Account Number: [id number] Date of Birth/Sex: 01-03-1946 (73 y.o. M) Treating RN: Rodell Perna Primary Care Tyrie Porzio: [name] Other Clinician: Referring Jersey Ravenscroft: [name] Treating Coal Nearhood/Extender: STONE III, HOYT Weeks in Treatment: 142 Edema Assessment Assessed: [Left: No] [Right: No] Edema: [Left: N] [Right: o] Vascular Assessment Pulses: Dorsalis Pedis Palpable: [Right:Yes] Posterior Tibial Extremity colors, hair growth, and conditions: Extremity Color: [Right:Hyperpigmented] Hair Growth on Extremity: [Right:No] Temperature of Extremity: [Right:Warm] Capillary Refill: [Right:< 3 seconds] Toe Nail Assessment Left: Right: Thick: Yes Discolored: Yes Deformed: No Improper Length and Hygiene: No Electronic Signature(s) Signed: 10/07/2018 4:35:39 PM By: Rodell Perna Entered By: Rodell Perna on 10/07/2018 08:24:54 [name] ([id number]) -------------------------------------------------------------------------------- Multi Wound Chart Details Patient Name: [name] Date of Service: 10/07/2018 8:00 AM Medical Record Number: [id number] Patient Account Number: [id number] Date of Birth/Sex: 1946/02/02 (73 y.o. M) Treating RN: Curtis Sites Primary Care Jaedah Lords: [name] Other Clinician: Referring Rane Blitch: [name] Treating Lorielle Boehning/Extender: STONE III, HOYT Weeks in Treatment: 142 Vital Signs Height(in): 76 Pulse(bpm):  90 Weight(lbs): 238 Blood Pressure(mmHg): 152/78 Body Mass Index(BMI): 29 Temperature(F): 98.5 Respiratory Rate 16 (breaths/min): Photos: [1:No Photos] [2:No Photos] [N/A:N/A] Wound Location: [1:Right Malleolus - Lateral] [2:Right Malleolus - Medial] [N/A:N/A] Wounding Event: [1:Gradually Appeared] [2:Gradually Appeared] [N/A:N/A] Primary Etiology: [1:Venous Leg Ulcer] [2:Venous Leg Ulcer] [N/A:N/A] Comorbid History: [1:Cataracts, Chronic Obstructive Cataracts, Chronic Obstructive N/A Pulmonary Disease (COPD), Pulmonary Disease (COPD), Osteoarthritis] [2:Osteoarthritis] Date Acquired: [1:08/11/2015] [2:01/30/2016] [N/A:N/A] Weeks of Treatment: [1:142] [2:140] [N/A:N/A] Wound Status: [1:Open] [2:Open] [N/A:N/A] Clustered Wound: [1:No] [2:Yes] [N/A:N/A] Measurements L x W x D [1:5x3.6x0.1] [2:2x1.5x0.2] [N/A:N/A] (cm) Area (cm) : [1:14.137] [2:2.356] [N/A:N/A] Volume (cm) : [1:1.414] [2:0.471] [N/A:N/A] % Reduction in Area: [1:18.20%] [2:79.60%] [N/A:N/A] %  Reduction in Volume: [1:72.70%] [2:59.20%] [N/A:N/A] Classification: [1:Full Thickness Without Exposed Support Structures] [2:Full Thickness Without Exposed Support Structures] [N/A:N/A] Exudate Amount: [1:Medium] [2:Medium] [N/A:N/A] Exudate Type: [1:Serous] [2:Serous] [N/A:N/A] Exudate Color: [1:amber] [2:amber] [N/A:N/A] Wound Margin: [1:Flat and Intact] [2:Flat and Intact] [N/A:N/A] Granulation Amount: [1:Medium (34-66%)] [2:None Present (0%)] [N/A:N/A] Granulation Quality: [1:Red] [2:N/A] [N/A:N/A] Necrotic Amount: [1:Medium (34-66%)] [2:Large (67-100%)] [N/A:N/A] Exposed Structures: [1:Fat Layer (Subcutaneous Tissue) Exposed: Yes Fascia: No Tendon: No Muscle: No Joint: No Bone: No] [2:Fat Layer (Subcutaneous Tissue) Exposed: Yes Fascia: No Tendon: No Muscle: No Joint: No Bone: No] [N/A:N/A] Epithelialization: [1:Small (1-33%)] [2:None] [N/A:N/A] Periwound Skin Texture: [1:Scarring: Yes Excoriation: No Induration: No]  [2:Scarring: Yes Excoriation: No Induration: No] [N/A:N/A] Callus: No Callus: No Crepitus: No Crepitus: No Rash: No Rash: No Periwound Skin Moisture: Maceration: No Maceration: No N/A Dry/Scaly: No Dry/Scaly: No Periwound Skin Color: Hemosiderin Staining: Yes Atrophie Blanche: No N/A Atrophie Blanche: No Cyanosis: No Cyanosis: No Ecchymosis: No Ecchymosis: No Erythema: No Erythema: No Hemosiderin Staining: No Mottled: No Mottled: No Pallor: No Pallor: No Rubor: No Rubor: No Temperature: No Abnormality No Abnormality N/A Tenderness on Palpation: Yes Yes N/A Wound Preparation: Ulcer Cleansing: Ulcer Cleansing: N/A Rinsed/Irrigated with Saline Rinsed/Irrigated with Saline Topical Anesthetic Applied: Topical Anesthetic Applied: Other: lidocaine 4% Other: lidocaine 4% Treatment Notes Electronic Signature(s) Signed: 10/07/2018 4:52:44 PM By: Curtis Sites Entered By: Curtis Sites on 10/07/2018 08:31:13 [name] ([id number]) -------------------------------------------------------------------------------- Multi-Disciplinary Care Plan Details Patient Name: [name] Date of Service: 10/07/2018 8:00 AM Medical Record Number: [id number] Patient Account Number: [id number] Date of Birth/Sex: 11/19/1945 (73 y.o. M) Treating RN: Curtis Sites Primary Care Roshan Roback: [name] Other Clinician: Referring Dayja Loveridge: [name] Treating Raliyah Montella/Extender: STONE III, HOYT Weeks in Treatment: 142 Active Inactive Venous Leg Ulcer Nursing Diagnoses: Knowledge deficit related to disease process and management Goals: Patient will maintain optimal edema control Date Initiated: 04/03/2016 Target Resolution Date: 11/24/2016 Goal Status: Active Interventions: Compression as ordered Treatment Activities: Therapeutic compression applied : 04/03/2016 Notes: Wound/Skin Impairment Nursing Diagnoses: Impaired tissue integrity Goals: Ulcer/skin breakdown will  heal within 14 weeks Date Initiated: 01/17/2016 Target Resolution Date: 11/24/2016 Goal Status: Active Interventions: Assess ulceration(s) every visit Notes: Electronic Signature(s) Signed: 10/07/2018 4:52:44 PM By: Curtis Sites Entered By: Curtis Sites on 10/07/2018 08:31:02 [name] ([id number]) -------------------------------------------------------------------------------- Pain Assessment Details Patient Name: [name] Date of Service: 10/07/2018 8:00 AM Medical Record Number: [id number] Patient Account Number: [id number] Date of Birth/Sex: Oct 28, 1945 (73 y.o. M) Treating RN: Rodell Perna Primary Care Dashay Giesler: [name] Other Clinician: Referring Amandine Covino: [name] Treating Nori Poland/Extender: STONE III, HOYT Weeks in Treatment: 142 Active Problems Location of Pain Severity and Description of Pain Patient Has Paino No Site Locations Pain Management and Medication Current Pain Management: Electronic Signature(s) Signed: 10/07/2018 4:35:39 PM By: Rodell Perna Entered By: Rodell Perna on 10/07/2018 08:08:08 [name] ([id number]) -------------------------------------------------------------------------------- Patient/Caregiver Education Details Patient Name: [name] Date of Service: 10/07/2018 8:00 AM Medical Record Number: [id number] Patient Account Number: [id number] Date of Birth/Gender: July 02, 1946 (73 y.o. M) Treating RN: Curtis Sites Primary Care Physician: [name] Other Clinician: Referring Physician: [name] Treating Physician/Extender: Skeet Simmer in Treatment: 142 Education Assessment Education Provided To: Patient Education Topics Provided Wound/Skin Impairment: Handouts: Other: wound care to continue as ordered Methods: Demonstration, Explain/Verbal Responses: State content correctly Electronic Signature(s) Signed: 10/07/2018 4:52:44 PM By: Curtis Sites Entered By: Curtis Sites on  10/07/2018 08:57:49 [name] ([id number]) -------------------------------------------------------------------------------- Wound Assessment Details Patient Name: [name],  [name] Date of Service: 10/07/2018 8:00 AM Medical Record Number: [id number] Patient Account Number: [id number] Date of Birth/Sex: 1946-03-02 [age] y.o. M) Treating RN: Rodell PernaScott, Dajea Primary Care Jelisa [place]: Darreld McleanMILES, LINDA Other Clinician: Referring Theodore Virgin: Darreld McleanMILES, LINDA Treating Kaveh Kissinger/Extender: STONE III, HOYT Weeks in Treatment: 142 Wound Status Wound Number: 1 Primary Venous Leg Ulcer Etiology: Wound Location: Right Malleolus - Lateral Wound Open Wounding Event: Gradually Appeared Status: Date Acquired: 08/11/2015 Comorbid Cataracts, Chronic Obstructive Pulmonary Weeks Of Treatment: 142 History: Disease (COPD), Osteoarthritis Clustered Wound: No Photos Photo Uploaded By: Rodell PernaScott, Dajea on 10/07/2018 10:18:49 Wound Measurements Length: (cm) 5 Width: (cm) 3.6 Depth: (cm) 0.1 Area: (cm) 14.137 Volume: (cm) 1.414 % Reduction in Area: 18.2% % Reduction in Volume: 72.7% Epithelialization: Small (1-33%) Tunneling: No Undermining: No Wound Description Full Thickness Without Exposed Support Foul Odo Classification: Structures Slough/F Wound Margin: Flat and Intact Exudate Medium Amount: Exudate Type: Serous Exudate Color: amber r After Cleansing: No ibrino Yes Wound Bed Granulation Amount: Medium (34-66%) Exposed Structure Granulation Quality: Red Fascia Exposed: No Necrotic Amount: Medium (34-66%) Fat Layer (Subcutaneous Tissue) Exposed: Yes Necrotic Quality: Adherent Slough Tendon Exposed: No Muscle Exposed: No Joint Exposed: No Bone Exposed: No [name], [name] ([id number]) Periwound Skin Texture Texture Color No Abnormalities Noted: No No Abnormalities Noted: No Callus: No Atrophie Blanche: No Crepitus: No Cyanosis: No Excoriation: No Ecchymosis: No Induration:  No Erythema: No Rash: No Hemosiderin Staining: Yes Scarring: Yes Mottled: No Pallor: No Moisture Rubor: No No Abnormalities Noted: No Dry / Scaly: No Temperature / Pain Maceration: No Temperature: No Abnormality Tenderness on Palpation: Yes Wound Preparation Ulcer Cleansing: Rinsed/Irrigated with Saline Topical Anesthetic Applied: Other: lidocaine 4%, Treatment Notes Wound #1 (Right, Lateral Malleolus) Notes Gentamycin, hydrafera blue, ABD and conform Electronic Signature(s) Signed: 10/07/2018 10:17:35 AM By: Rodell PernaScott, Dajea Entered By: Rodell PernaScott, Dajea on 10/07/2018 10:17:33 [name] StackBLACKWELL, [name] ([id number]) -------------------------------------------------------------------------------- Wound Assessment Details Patient Name: [name] StackBLACKWELL, [name] Date of Service: 10/07/2018 8:00 AM Medical Record Number: [id number] Patient Account Number: [id number] Date of Birth/Sex: 1946-03-02 11(72 y.o. M) Treating RN: Rodell PernaScott, Dajea Primary Care Leeanna Slaby: Darreld McleanMILES, LINDA Other Clinician: Referring Abdishakur Gottschall: Darreld McleanMILES, LINDA Treating Fontella Shan/Extender: STONE III, HOYT Weeks in Treatment: 142 Wound Status Wound Number: 2 Primary Venous Leg Ulcer Etiology: Wound Location: Right Malleolus - Medial Wound Open Wounding Event: Gradually Appeared Status: Date Acquired: 01/30/2016 Comorbid Cataracts, Chronic Obstructive Pulmonary Weeks Of Treatment: 140 History: Disease (COPD), Osteoarthritis Clustered Wound: Yes Photos Photo Uploaded By: Rodell PernaScott, Dajea on 10/07/2018 10:18:50 Wound Measurements Length: (cm) 2 Width: (cm) 1.5 Depth: (cm) 0.2 Area: (cm) 2.356 Volume: (cm) 0.471 % Reduction in Area: 79.6% % Reduction in Volume: 59.2% Epithelialization: None Tunneling: No Undermining: No Wound Description Full Thickness Without Exposed Support Foul Odo Classification: Structures Slough/F Wound Margin: Flat and Intact Exudate Medium Amount: Exudate Type: Serous Exudate Color: amber r After  Cleansing: No ibrino Yes Wound Bed Granulation Amount: None Present (0%) Exposed Structure Necrotic Amount: Large (67-100%) Fascia Exposed: No Necrotic Quality: Adherent Slough Fat Layer (Subcutaneous Tissue) Exposed: Yes Tendon Exposed: No Muscle Exposed: No Joint Exposed: No Bone Exposed: No [name], [name] ([id number]) Periwound Skin Texture Texture Color No Abnormalities Noted: No No Abnormalities Noted: No Callus: No Atrophie Blanche: No Crepitus: No Cyanosis: No Excoriation: No Ecchymosis: No Induration: No Erythema: No Rash: No Hemosiderin Staining: No Scarring: Yes Mottled: No Pallor: No Moisture Rubor: No No Abnormalities Noted: No Dry / Scaly: No Temperature / Pain Maceration: No Temperature: No Abnormality Tenderness on Palpation: Yes Wound Preparation Ulcer  Cleansing: Rinsed/Irrigated with Saline Topical Anesthetic Applied: Other: lidocaine 4%, Treatment Notes Wound #2 (Right, Medial Malleolus) Notes Gentamycin, hydrafera blue, ABD and conform Electronic Signature(s) Signed: 10/07/2018 4:35:39 PM By: Rodell Perna Entered By: Rodell Perna on 10/07/2018 08:24:10 [name] ([id number]) -------------------------------------------------------------------------------- Vitals Details Patient Name: [name] Date of Service: 10/07/2018 8:00 AM Medical Record Number: [id number] Patient Account Number: [id number] Date of Birth/Sex: [birth year] (73 y.o. M) Treating RN: Rodell Perna Primary Care Carrine Kroboth: [name] Other Clinician: Referring Jyra Lagares: [name] Treating Demarrius Guerrero/Extender: STONE III, HOYT Weeks in Treatment: 142 Vital Signs Time Taken: 08:02 Temperature (F): 98.5 Height (in): 76 Pulse (bpm): 90 Weight (lbs): 238 Respiratory Rate (breaths/min): 16 Body Mass Index (BMI): 29 Blood Pressure (mmHg): 152/78 Reference Range: 80 - 120 mg / dl Airway Electronic Signature(s) Signed: 10/07/2018 4:35:39 PM By:  Rodell Perna Entered By: Rodell Perna on 10/07/2018 08:09:27

## 2018-10-21 ENCOUNTER — Encounter: Payer: Medicare HMO | Admitting: Physician Assistant

## 2018-10-21 DIAGNOSIS — I87331 Chronic venous hypertension (idiopathic) with ulcer and inflammation of right lower extremity: Secondary | ICD-10-CM | POA: Diagnosis not present

## 2018-10-22 NOTE — Progress Notes (Signed)
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 the medial aspect of the same ankle. 02/22/16; once again the patient complains of a lot of drainage coming out of this wound. We brought him back in on Friday for a dressing change has been using Iodoflex. States his pain level is  better 02/29/16; still complaining of a lot of drainage even though we are putting absorbent material over the Santyl and bringing him back on Fridays for dressing changes. He is not complaining of pain. Her intake nurse notes blistering 03/07/16: pt returns today for f/u. he admits out in rain on Saturday and soaked his right leg. he did not share with his wife and he didn't notify the [facility]. he has an odor today that is c/w pseudomonas. Wound has greenish tan slough. there is no periwound erythema, induration, or fluctuance. wound has deteriorated since previous visit. denies fever, chills, body aches or malaise. no increased pain. 03/13/16: C+S showed proteus. He has not received AB'S. Switched to RTD last week. 03/27/16 patient is been using Iodoflex. Wound bed has improved and debridement is certainly easier 04/10/2016 -- he has been scheduled for a venous duplex study towards the end of the month 04/17/16; has been using silver alginate, states that the Iodoflex was hurting his wound and since that is been changed he has had no pain unfortunately the surface of the wound continues to be unhealthy with thick gelatinous slough and nonviable tissue. The wound will not heal like this. 04/20/2016 -- the patient was here for a nurse visit but I was asked to see the patient as the slough was quite significant and the nurse needed for clarification regarding the ointment to be used. 04/24/16; the patient's wounds on the right medial and right lateral ankle/malleolus both look a lot better today. Less adherent slough healthier tissue. Dimensions better especially medially 05/01/16; the patient's wound surface continues to improve however he continues to require debridement switch her easier each week. Continue Santyl/Metahydrin mixture Hydrofera Blue next week. Still drainage on the medial aspect according to the intake nurse 05/08/16; still using Santyl and Medihoney. Still a lot of drainage per her intake  nurse. Patient has no complaints pain fever chills etc. 05/15/16 switched the Hydrofera Blue last week. Dimensions down especially in the medial right leg wound. Area on the lateral which is more substantial also looks better still requires debridement 05/22/16; we have been using Hydrofera Blue. 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 Currently he tells me that the pain is doing very well about a 1 out of 10 today. Yesterday was a little bit worse but he tells me that he was more active watering his flowers that day. Overall he feels that his symptoms are doing significantly better at this point in time. His edema continues to be controlled well with the 4-layer compression wrap and he really has not noted any odor at this point in time. He is tolerating the dressing changes when they are performed well. 06/05/16 at this point in time today patient currently shows no interval signs or symptoms of local or systemic infection. Again his pain level he rates to be a 1 out of 10 at most and overall he tells me that generally this is not giving  him much trouble. In fact he even feels maybe a little bit better than last week. We have continue with the 4-layer compression wrap in which she tolerates very well at this point. He is continuing to utilize the [place]. 06/12/16 I think there has been some progression in the status of both of these wounds over today again covered in a gelatinous surface. Has been using Hydrofera Blue. We had used Iodoflex in the past I'm not sure if there was an issue other than changing to something that might progress towards closure faster 06/19/16; he did not tolerate the Flexeril last week secondary to pain and this was changed on Friday back to  [facility] area he continues to have copious amounts of gelatinous surface slough which is think inhibiting the speed of healing this area 06/26/16 patient over the last week has utilized the Santyl to try to loosen up some of the tightly adherent slough that was noted on evaluation last week. The good news is he tells me that the medial malleoli region really does not bother him the right llateral malleoli region is more tender to palpation at this point in time especially in the central/inferior location. However it does appear that the Santyl has done his job to loosen up the adherent slough at this point in time. Fortunately he has no interval signs or symptoms of infection locally or systemically no purulent discharge noted. 07/03/16 at this point in time today patient's wounds appear to be significantly improved over the right medial and lateral malleolus locations. He has much less tenderness at this point in time and the wounds appear clean her although there is still adherent slough this is sufficiently improved over what I saw last week. I still see no evidence of local infection. 07/10/16; continued gradual improvement in the right medial and lateral malleolus locations. The lateral is more substantial wound now divided into 2 by a rim of normal epithelialization. 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 He denies any complaints of pain or intolerance to compression. Both ulcers are smaller in size oriented today's measurements, both are covered with a softly adherent slough. 07/24/16; medial wound is smaller, lateral about the same although surface looks better. Still using Hydrofera Blue 07/31/16; arrives today complaining of pain in the lateral part of his foot. Nurse reports a lot more drainage. He has been using Hydrofera Blue. Switch to silver alginate  today 08/03/2016 -- I was asked to see the patient was here for a nurse visit today. I understand he had a lot of pain in his right lower extremity and was having blisters on his right foot which have not been there before. Though he started on doxycycline he does not have blisters elsewhere on his body. I do not believe this is a drug allergy. also mentioned that there was a copious purulent discharge from the wound and clinically there is no evidence of cellulitis. 08/07/16; I note that the patient came in for his nurse check on Friday apparently with blisters on his toes on the right than a lot of swelling in his forefoot. He continued on the doxycycline that I had prescribed on 12/8. A culture was done of the lateral wound that showed a combination of a few Proteus and Pseudomonas. Doxycycline might of covered the Proteus but would be unlikely to cover the Pseudomonas. He is on Coumadin. He arrives  in the clinic today feeling a lot better states the pain is a lot better but nothing specific really was done other than to rewrap the foot also noted that he had arterial studies ordered in August although these were never done. It is reasonable to go ahead and reorder these. 08/14/16; generally arrives in a better state today in terms of the wounds he has taken cefdinir for one week. Our intake nurse reports copious amounts of drainage but the patient is complaining of much less pain. He is not had his PT and INR checked and I've asked him to do this today or tomorrow. 08/24/2016 -- patient arrives today after 10 days and said he had a stomach upset. His arterial study was done and I have reviewed this report and find it to be within normal limits. However I did not note any venous duplex studies for reflux, and Dr. Leanord Hawking may have ordered these in the past but I will leave it to him to decide if he needs these. The patient has finished his course of cefdinir. 08/28/16; patient arrives today again  with copious amounts of thick really green drainage for our intake nurse. He states he has a very tender spot at the superior part of the lateral wound. Wounds are larger 09/04/16; no real change in the condition of this patient's wound still copious amounts of surface slough. Started him on Iodoflex last week he is completing another course of Cefdinir or which I think was done empirically. His arterial study showed ABIs were 1.1 on the right 1.5 on the left. He did have a slightly reduced ABI in the right the left one was not obtained. Had calcification of the right posterior tibial artery. The interpretation was no segmental stenosis. His waveforms were triphasic. [name], [name] ([id number]) His reflux studies are later this month. Depending on this I'll send him for a vascular consultation, he may need to see plastic surgery as I believe he is had plastic surgery on this foot in the past. He had an injury to the foot in the 1980s. 1/16 /18 right lateral greater than right medial ankle wounds on the right in the setting of previous skin grafting. 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 He does not have arterial issues. Each week he comes in with the same adherent surface slough although there was less of this today 09/18/16; right lateral greater than right medial lower extremity wounds in the setting of previous skin grafting and trauma. He has least to vein laser ablation scheduled for February 2 for venous reflux. He does not have significant arterial disease. Problem has been very difficult to handle surface slough/necrotic tissue. Recently using Iodoflex for this with some, albeit slow improvement 09/25/16; right lateral greater than right medial lower extremity venous wounds in the setting of previous skin grafting. He is going for ablation surgery on February 2 after this he'll come back here for rewrap.  He has been using Iodoflex as the primary dressing. 10/02/16; right lateral greater than right medial lower extremity wounds in the setting of previous skin grafting. He had his ablation surgery last week, I don't have a report. He tolerated this well. Came in with a thigh-high Unna boots on Friday. We have been using Iodoflex as the primary dressing. His measurements are improving 10/09/16; continues to make nice aggressive in terms of the wounds on his lateral  and medial right ankle in the setting of previous skin grafting. Yesterday he noticed drainage at one of his surgical sites from his venous ablation on the right calf. He took off the bandage over this area felt a "popping" sensation and a reddish-brown drainage. He is not complaining of any pain 10/16/16; he continues to make nice progression in terms of the wounds on the lateral and medial malleolus. Both smaller using Iodoflex. He had a surgical area in his posterior mid calf we have been using iodoform. All the wounds are down and dimensions 10/23/16; the patient arrives today with no complaints. He states the Iodoflex is a bit uncomfortable. He is not systemically unwell. We have been using Iodoflex to the lateral right ankle and the medial and Aquacel Ag to the reflux surgical wound on the posterior right calf. All of these wounds are doing well 10/30/16; patient states he has no pain no systemic symptoms. I changed him to [facility] last week. Although the wounds are doing well 11/06/16; patient reports no pain or systemic symptoms. We continue with Hydrofera Blue. Both wound areas on the medial and lateral ankle appear to be doing well with improvement and dimensions and improvement in the wound bed. 11/13/16; patient's dimensions continued to improve. We continue with Hydrofera Blue on the medial and lateral side. Appear to be doing well with healthy granulation and advancing epithelialization 11/20/16; patient's dimensions improving  laterally by about half a centimeter in length. Otherwise no change on the medial side. Using [facility] 12/04/16; no major change in patient's wound dimensions. Intake nurse reports more drainage. 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 He is voicing no complaints or concerns. He has been tolerating his twice weekly compression therapy changes 12/11/16 Patient complains of pain and increased drainage.. wants hydrofera blue 12/18/16 improvement. Sorbact 12/25/16; medial wound is smaller, lateral measures the same. Still on sorbact 01/01/17; medial wound continues to be smaller, lateral measures about the same however there is clearly advancing epithelialization here as well in fact I think the wound will ultimately divided into 2 open areas 01/08/17; unfortunately today fairly significant regression in several areas. Surface of the lateral wound covered again in adherent necrotic material which is difficult to debridement. He has significant surrounding skin maceration. The expanding area of tissue epithelialization in the middle of the wound that was encouraging last week appears to be smaller. There is no surrounding tenderness. The area on the medial leg also did not seem to be as healthy as last week, the reason for this regression this week is not totally clear. We have been using Sorbact for the last 4 weeks. We'll switched of polymen AG which we will order via home medical supply. If there is a problem with this would switch back to Iodoflex 01/15/18; drainage,odor. No change. Switched to polymen last week 01/22/17; still continuous drainage. Culture I did last week showed a few Proteus pansensitive. I did this culture because of drainage. Put him on Augmentin which she has been taking since Saturday however he is developed 4-5 liquid bowel movements. He is also on Coumadin. Beyond this  wound is not changed at all, still nonviable necrotic surface material which I debrided reveals healthy granulation line 01/29/17; still copious amounts of drainage reported by her intake nurse. Wound measuring slightly smaller. Currently fact on Iodoflex although I'm looking forward to changing back to perhaps [facility]  or polymen AG 02/05/17; still large amounts of drainage and presenting with really large amounts of adherent slough and necrotic material over the remaining open area of the wound. We have been using Iodoflex but with little improvement in the surface. Change to [facility] 02/12/17; still large amount of drainage. Much less adherent slough however. Started [employer]	[place] last week CARLOUS, OLIVARES ([id number]) 02/19/17; drainage is better this week. Much less adherent slough. Perhaps some improvement in dimensions. Using [facility] 02/26/17; severe venous insufficiency wounds on the right lateral and right medial leg. Drainage is some better and slough is less adherent we've been using Hydrofera Blue 03/05/17 on evaluation today patient appears to be doing well. His wounds have been decreasing in size and overall he is pleased with how this is progressing. We are awaiting approval for the epigraph which has previously been recommended in the meantime the [facility] Dressing is to be doing very well for him. 03/12/17; wound dimensions are smaller still using Hydrofera Blue. Comes in on Fridays for a dressing change. 03/19/17; wound dimensions continued to contract. Healing of this wound is complicated by continuous significant drainage as well as recurrent buildup of necrotic surface material. We looked into Apligraf and he has a $290 co-pay per application but truthfully I think the drainage as well as the nonviable surface would preclude use of Apligraf there are any other skin substitute at this point therefore the continued plan will be debridement each clinic visit,  2 times a week dressing changes and continued use of Hydrofera Blue. improvement has been very slow but sustained 03/26/17 perhaps slight improvements in peripheral epithelialization is especially inferiorly. Still with large amount of drainage and tightly adherent necrotic surface on arrival. Along with the intake nurse I reviewed previous treatment. He worsened on Iodoflex and had a 4 week trial of sorbact, Polymen AG and a long courses of Aquacel Ag. He is not a candidate for advanced treatment options for many reasons 04/09/17 on evaluation today patient's right lower extremity wounds appear to be doing a little better. Fortunately he has no significant discomfort and has been tolerating the dressing changes including the wraps without complication. With that being said he really does not have swelling anymore compared to what he has had in the past following his vascular intervention. He wonders if potentially we could attempt avoiding the rats to see if he could cleanse the wound in between to try to prevent some of the fiber and its buildup that occurs in the interim between when we see him week to week. No fevers, chills, nausea, or vomiting noted at this time. 04/16/17; noted that the staff made a choice last week not to put him in compression. The patient is changing his dressing at home using [facility] and changing every second day. His wife is washing the wound with saline. He is using kerlix. Surprisingly he has not developed a lot of edema. This choice was made because of the degree of fluid retention and maceration even with changing his dressing twice a week 04/23/17; absolutely no change. Using Hydrofera Blue. Recurrent tightly adherent nonviable surface material. This is been refractory to Iodoflex, sorbact and now Hydrofera Blue. More recently he has been changing his own dressings at home, cleansing the wound in the shower. He has not developed lower extremity edema 04/30/17; if  anything the wound is larger still in adherent surface although it debrided easily today. I've been using Mehta honey for 1 week and I'd  like to try and do it for a second week this see if we can get some form of viable surface here. 05/07/17; patient arrives with copious amounts of drainage and some pain in the superior part of the wound. He has not been systemically unwell. He's been changing this daily at home 05/14/17; patient arrives today complaining of less drainage and less pain. Dimensions slightly better. Surface culture I did of this last week grew MSSA I put him on dicloxacillin for 10 days. Patient requesting a further prescription since he feels so much better. He did not obtain the [facility] AG for reasons that aren't clear, he has been using Hydrofera Blue 05/21/17; perhaps some less drainage and less pain. He is completing another week of doxycycline. Unfortunately there is no change in the wound measurements are appearance still tightly adherent necrotic surface material that is really defied treatment. We have been using Hydrofera Blue most recently. He did not manage to get Anasept. He was told it was prescription at [facility] 05/29/17; absolutely no improvement in these wound areas. He had remote plastic surgery with who was done at [facility] in [place] surrounding this area. This was a traumatic wound with extensive plastic surgery/skin grafting at that time. I switched him to sorbact last week to see if he can do anything about the recurrent necrotic surface and drainage. This is largely been refractory to Iodoflex/Hydrofera Blue/alginates. I believe we tried [employer] and more recently Medi honey l however the drainage is really too excessive 06/04/17; patient went to see Dr. Ulice Bold. Per the patient she ordered him a compression stocking. Continued the Anasept gel and sorbact was already on. No major improvement in the condition of the wounds in fact the medial wound looks  larger. Again per the patient she did not feel a skin graft or operative debridement was indicated The patient had previous venous ablation in February 2018. Last arterial studies were in December 2017 and were within normal limits 06/18/17; patient arrives back in clinic today using Anasept gel with sorbact. He changes this every day is wearing his own compression stocking. The patient actually saw Dr. Leta Baptist of plastic surgery. I've reviewed her note. The appointment was on 05/30/17. The basic issue with here was that she did not feel that skin grafts for patients with underlying stasis and edema have a good long-term success rate. She also discuss skin substitutes which has been done here as well however I have not been able to get the surface of these wounds to something that I think would support skin substitutes. This is why he felt he might need an AKIN, YI. ([id number]) operative debridement more aggressively than I can do in this clinic Review of systems; is otherwise negative he feels well 07/02/17; patient has been using Anasept gel with Sorbact. He changes this every day scrubs out the wound beds. Arrives today with the wound bed looking some better. Easier debridement 07/16/17; patient has been using Anasept gel was sorbact for about a month now. The necrotic service on his wound bed is better and the drainage is now down to minimal but we've not made any improvements in the epithelialization. Change to Santyl today. Surface of the wound is still not good enough to support skin substitutes 07/30/17 on evaluation today patient appears to be doing very well in regard to his right bimalleolus wounds. He has been tolerating the treatment with the Anasept gel and sorbact. However we were going to try and switch to [facility]  to be more aggressive with these wounds. This was however cost prohibitive. Therefore we will likely go back to the sorbact. Nonetheless he is not having any  significant discomfort he still is forming slough over the wound location but nothing too significant. The wounds do appear to be filling in nicely. 08/13/17; I have not seen this wound in about a month. There is not much change. The fact the area medially looks larger. He has been using sorbact and Anasept for over a month now without much effect. Arrives in clinic stating that he does not want the area debrided 08/28/17; patient arrives with the areas on his right medial and right lateral ankle worse. The wounds of expanded there is erythema and still drainage. There is no pain and no tenderness. We had been using Keracel AG clearly not doing well. 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 He states that even the evening that we began utilizing the Iodoflex he started having burning. Subsequently this never really improved and he eventually discontinue the use of the Iodoflex altogether. Unfortunately he did not let us know about any of this until today. I'm unsure of any specific infection at this point although I am going to obtain a wound culture today in order to see if there's anything that could potentially be causing an infection as well. It does sound as if he's had the Iodoflex before and did not have this kind of reaction although this does sound very specific for a reaction to the Iodoflex. 09/17/17 on evaluation today patient appears to be doing significantly better compared to last week's evaluation. At that time it actually appears that he did have an infection the good news is that we have been able to get this under control with switching to the [facility] solution he's not having as much pain and discomfort he still does have  some erythema surrounding the medial aspect wound. He has been tolerating the Dakin's soaked gauze dressing which is excellent and that his wounds do not seem to have as much adherent slough. Overall I'm pleased with the progress she's made in last week. 11/05/17 on evaluation today patient appears to be doing better in regard to his right medial and lateral malleolus ulcers. He has been tolerating the dressing changes without complication. I do flight the Freada Bergeron is helping with additional granulation at this point in the wound beds do appear to be a little bit better. With that being said patient does not appear to have any evidence of significant infection at this point there is no erythema as he has previously noted he does note that the 30 day course of antibiotics I placed him on will end tomorrow. 11/12/17 on evaluation today patient actually appears to be doing excellent in regard to his right medial and lateral malleolus ulcers. He has been tolerating the dressing changes without complication. Fortunately he has no evidence of infection at this time and overall I believe he is progressing extremely nicely he has a lot of new epithelialization noted on evaluation today. Patient likewise is very pleased with the progress even just since last week. 11/19/17 on evaluation today patient appears to be doing about the same in regard to his ulcerations.  He does not have any additional breakdown although he really has not noted any significant improvement either over the past week. With that being said the more concerning thing at this time is that he is beginning to show signs of erythema surrounding both wounds which is what typically happens and then he will develop into a more overt infection. This has been the course for some time. I hope that doing the 30 day in a biotic cycle this past time would break that so that he would not have the same type of thing happen again. Nonetheless it appears that  is digressing back into this infected state unfortunately. With that being said he is not having any pain currently which is good he typically does start to hurt more as this progresses. Fortunately there does not appear to be any evidence of infection systemically which is good news. 11/26/17 on evaluation today patient appears to actually be doing rather well in regard to his ulcer compared to last week. He still has your theme and noted around both the medial and lateral malleolus ulcers of the right lower extremity. He does not seem to be quite as overtly infected however. He has been tolerating the dressing changes without complication which is good news. The gentamicin cream does seem to have been of benefit for him. With that being said he does actually have an appointment with Dr. Sampson Goon this morning to see if there's anything that would be recommended in the way of IV antibiotic Moss, [name] therapy. Otherwise he still has slough noted on the surface of the wound. 12/03/17 on evaluation today patient presents for follow-up concerning his ongoing right lower extremity ulcers. He has been tolerating the dressing changes without complication he states he has not really been using gentamicin on the lateral ankle and this seems to be doing very well. For that reason I think that is probably fine to put a hold on the gentamicin he states his burns and stings when he applies it and we maybe be able to just use this intermittently when things become more irritated. Other than that the good news is I did review his MRI which was performed on 11/28/17 and revealed that he has a negative MRI for abscess, osteomyelitis, or septic joint. Obviously these were the issues that I was concerned with the possibility of and I'm pleased to find that there is no problems in that regard. I did also review Dr. Jarrett Ables note from infectious disease he discussed performed some lab work which  apparently was done and then subsequently was going to consider the possibility of Keflex long-term for prevention of infection although this has not been prescribed as of yet. 12/09/17 on evaluation today patient appears to be doing a little bit worse in regard to his right medial and lateral malleolus ulcers. Fortunately the wound does not seem to be significantly worse although he does have a lot more drainage especially the lateral aspect he is having a lot more pain than what he has noted more recently. Fortunately there does not appear to be any evidence of systemic infection which is good news. Again he has seen Dr. Sampson Goon but he has not been placed on the potential Keflex which was recommended as a possibility at least yet. 12/17/17 unfortunately on evaluation today the patient's wound appears to be doing much worse. He has been performing the dressing changes as directed. Upon a review of notes in epic it does appear that Dr. Jarrett Ables  office specifically sending Keflex for the patient on the 16th which was last Tuesday. With that being said the patient states that though this was sent into [facility] that [facility] stated they never received it and subsequently the patient never took anything. He tells me he has been in bed since Friday due to the increased pain and overall general malaise. No fevers, chills, nausea, or vomiting noted at this time. 02/25/18 on evaluation today patient appears to be doing rather well in regard to his bilateral right malleolus ulcers. He has been tolerating the dressing changes without complication. Currently there does not appear to be any evidence of infection. Overall I'm very pleased with the progress that seems to been made up to this point. 03/11/18 on evaluation today patient actually appears to be doing very well in regard to his right ankle ulcers. He's been tolerating the dressing changes without complication. Fortunately there does not appear to be  any evidence of sniffing infection I do believe that the gentamicin cream continues to be of benefit for him. Fortunately he is showing signs of healing in which time I see him. He also tells me he's having no pain. 03/25/18 on evaluation today patient actually appears to show signs of improvement especially in regard to the right lateral ankle ulcer. He has been tolerating the dressing changes without complication. In general I'm very pleased with the progress that has been made up to this point. 04/08/18- He presents in follow up evaluation for right bimalleolar wounds; there is essentially no change. He admits to removing the dressing at night to allow the wounds to be "air out". We will continue with same treatment plan and he will follow up in two weeks 04/22/18 on evaluation today patient seems very frustrated with this situation currently as far as the ulcer on his right medial and lateral malleolus is concerned. He states this has been going on a very long time and obviously he does have a lot of bills as well is far as wound care is concerned. With that being said he tells me that he's been tolerating the dressing changes well although he did clarify that what he is doing is putting the medicine on in the morning after shower and then subsequently he takes the dressing off at night leaving it open to air. I previously asked him about this and I was not aware that he was doing this. Nonetheless that may be slowing things down a bit at this point. Fortunately there does not appear to be any evidence of severe infection although he does have some evidence of your theme is surrounding the wound bed. He is still using the gentamicin cream. 05/06/18 on evaluation today patient actually presents for evaluation of his right medial malleolus ulcers. He's been tolerating the dressing changes without complication. With that being said it does appear that he is doing better compared to last evaluation.  He's not having the pain like he did previous. He also states that blisters that he had coming up when I saw him last did resolve and ended up doing okay he did go to the ER they gave him some cream to put on it he's not sure what the name of the cream was. He was down in [facility] when he called me and I spoke with him previous. Nonetheless he states that they told him to put the medicine on his our due list. With that being said I'm not really 100% sure that the medicine had  anything to do with this due to the fact that again he was having the blisters when he came into the office which is why we actually put them on the anabiotic I was concerned about him having an infection which was causing the blistering and increased pain in his extremity. Nonetheless this is something that we can definitely be cautious of in the future it was Bactrim [name], [name] ([id number]) that we had him on. Overall I'm glad he is doing better. 05/20/18 on evaluation today patient actually appears to be doing fairly well in regard to his ulcer on the right lateral and medial malleolus locations. Both seem to be doing decently well he does have slough building up on the surface of the wound. He has been using the Hibiclense as I instructed him. Overall there's no evidence of significant infection which is good news. 06/02/18 on evaluation today patient actually appears to be doing excellent in regard to his right medial and lateral malleolus ulcers. He has been tolerating the dressing changes without complication. There does not appear to be any evidence of infection at this time which is great news. In general I feel like he has made great progress in the past several weeks. He has good granulation noted over areas that have not had good granulation previously and epithelialization even more so in several areas. Both wounds measured smaller. 06/17/18 an evaluation today patient actually appears to be doing  excellent in regard to his right lateral and medial malleolus ulcers. He has been tolerating the dressing changes without complication fortunately the Hydrofera Blue Dressing to be doing excellent for him. He's actually making progress each time I see him. 07/08/18 on evaluation today patient's wounds on the right medial and lateral malleolus or region show signs of improvement currently. He has been tolerating the dressing changes without complication at this time. He does feel like the [facility] Dressing has been of benefit for him. No fevers, chills, nausea, or vomiting noted at this time. 07/22/18 and evaluation today patient actually appears to be doing excellent in regard to his right medial and lateral malleolus ulcers. He's been tolerating the dressing changes without complication the Hydrofera Blue Dressing years be excellent for him. Overall I'm extremely pleased in this regard. 08/05/18 on evaluation today patient's right lateral and medial malleolus ulcers appear to be doing well at this point. There does not appear to be any evidence of infection which is good news. Overall he does have some Slough noted which is going to require sharp debridement but other than this I see no current issues 08/19/18 on evaluation today patient actually appears to be doing better yet again in regard to his lower Trinity ulcer on the right medial and lateral malleolus regions. He is having no pain and no significant signs of infection which is great news. Overall things seem to be progressing quite nicely which is great news. No fevers, chills, nausea, or vomiting noted at this time. 09/09/18 on evaluation today patient appears to be doing very well in regard to his medial and lateral ankle ulcers. He's been tolerating the dressing changes without complication. Fortunately there is no evidence of infection at this time which is good news. No fevers, chills, nausea, or vomiting noted at this  time. 09/23/18 on evaluation today patient appears to be doing rather well in regard to his right medial and lateral ankle ulcers. He is been tolerating the dressing changes and still things continue to do excellent. Overall very  pleased with the progress and there's no signs of infection today. 10/07/18 on evaluation today patient appears to be doing very well in regard to his right medial and lateral ankle ulcers. He's been tolerating the dressing changes without complication. Fortunately there's no signs of infection at this time. Overall the patient states that he is very pleased with how things seem to be progressing. 10/21/18 on evaluation today patient actually appears to be doing very well in regard to his right medial and lateral malleolus ulcers. He's been tolerating the dressing changes without complication overall I'm very pleased with how things seem to be progressing. There's no signs of infection. Patient History Information obtained from Patient. Social History Former smoker - 10 years ago, Marital Status - Married, Alcohol Use - Moderate, Drug Use - No History, Caffeine Use - Never. Medical History Eyes Patient has history of Cataracts - removed Denison, Anothony C. ([id number]) Denies history of Glaucoma, Optic Neuritis Ear/Nose/Mouth/Throat Denies history of Chronic sinus problems/congestion Hematologic/Lymphatic Denies history of Anemia, Hemophilia, Human Immunodeficiency Virus, Lymphedema, Sickle Cell Disease Respiratory Patient has history of Chronic Obstructive Pulmonary Disease (COPD) Denies history of Aspiration, Asthma, Pneumothorax, Sleep Apnea, Tuberculosis Cardiovascular Denies history of Angina, Arrhythmia, Congestive Heart Failure, Coronary Artery Disease, Deep Vein Thrombosis, Hypertension, Hypotension, Myocardial Infarction, Peripheral Arterial Disease, Peripheral Venous Disease, Phlebitis, Vasculitis Gastrointestinal Denies history of Cirrhosis , Colitis,  Crohn s, Hepatitis A, Hepatitis B, Hepatitis C Endocrine Denies history of Type I Diabetes, Type II Diabetes Genitourinary Denies history of End Stage Renal Disease Immunological Denies history of Lupus Erythematosus, Raynaud s Integumentary (Skin) Denies history of History of Burn, History of pressure wounds Musculoskeletal Patient has history of Osteoarthritis Denies history of Gout, Rheumatoid Arthritis, Osteomyelitis Neurologic Denies history of Dementia, Neuropathy, Quadriplegia, Paraplegia, Seizure Disorder Oncologic Denies history of Received Chemotherapy, Received Radiation Psychiatric Denies history of Anorexia/bulimia, Confinement Anxiety Medical And Surgical History Notes Constitutional Symptoms (General Health) Vein Filter (groin area); CODA; H/O Blood Clots; pulmonary hypertensive arterial disease Review of Systems (ROS) Constitutional Symptoms (General Health) Denies complaints or symptoms of Fever, Chills. Respiratory The patient has no complaints or symptoms. Cardiovascular The patient has no complaints or symptoms. Psychiatric The patient has no complaints or symptoms. Objective Constitutional Well-nourished and well-hydrated in no acute distress. [name], [name] ([id number]) Vitals Time Taken: 8:08 AM, Height: 76 in, Weight: 238 lbs, BMI: 29, Temperature: 98.3 F, Pulse: 74 bpm, Respiratory Rate: 16 breaths/min, Blood Pressure: 145/68 mmHg. Respiratory normal breathing without difficulty. clear to auscultation bilaterally. Cardiovascular regular rate and rhythm with normal S1, S2. Psychiatric this patient is able to make decisions and demonstrates good insight into disease process. Alert and Oriented x 3. pleasant and cooperative. General Notes: Patient's wound bed at this time shows again excellent granulation as well as epithelialization I'm very pleased with how it seems to be progressing at this point. Fortunately there is no signs of  infection. Integumentary (Hair, Skin) Wound #1 status is Open. Original cause of wound was Gradually Appeared. The wound is located on the Right,Lateral Malleolus. The wound measures 5.2cm length x 3.4cm width x 0.1cm depth; 13.886cm^2 area and 1.389cm^3 volume. There is Fat Layer (Subcutaneous Tissue) Exposed exposed. There is no tunneling or undermining noted. There is a medium amount of serous drainage noted. The wound margin is flat and intact. There is medium (34-66%) red granulation within the wound bed. There is a medium (34-66%) amount of necrotic tissue within the wound bed including Adherent Slough. The periwound skin appearance exhibited: Scarring,  Hemosiderin Staining. The periwound skin appearance did not exhibit: Callus, Crepitus, Excoriation, Induration, Rash, Dry/Scaly, Maceration, Atrophie Blanche, Cyanosis, Ecchymosis, Mottled, Pallor, Rubor, Erythema. Periwound temperature was noted as No Abnormality. Wound #2 status is Open. Original cause of wound was Gradually Appeared. The wound is located on the Right,Medial Malleolus. The wound measures 2.3cm length x 1.5cm width x 0.2cm depth; 2.71cm^2 area and 0.542cm^3 volume. There is Fat Layer (Subcutaneous Tissue) Exposed exposed. There is no tunneling or undermining noted. There is a medium amount of serous drainage noted. The wound margin is flat and intact. There is no granulation within the wound bed. There is a large (67-100%) amount of necrotic tissue within the wound bed including Adherent Slough. The periwound skin appearance exhibited: Scarring. The periwound skin appearance did not exhibit: Callus, Crepitus, Excoriation, Induration, Rash, Dry/Scaly, Maceration, Atrophie Blanche, Cyanosis, Ecchymosis, Hemosiderin Staining, Mottled, Pallor, Rubor, Erythema. Periwound temperature was noted as No Abnormality. 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 ([id number]) Pre-procedure diagnosis of Wound #1 is a Venous Leg Ulcer located on the Right,Lateral Malleolus .Severity of Tissue Pre Debridement is: Fat layer exposed. There was a Excisional Skin/Subcutaneous Tissue Debridement with a total area of 17.68 sq cm performed by STONE III, HOYT E., PA-C. With the following instrument(s): Curette to remove Viable and Non-Viable tissue/material. Material removed includes Subcutaneous Tissue and Slough and after achieving pain control using Lidocaine 4% Topical Solution. No specimens were taken. A time out was conducted at 08:35, prior to the start of the procedure. A Minimum amount of bleeding was controlled with Pressure. The procedure was tolerated well with a pain level of 0 throughout and a pain level of 0 following the procedure. Post Debridement Measurements: 5.2cm length x 3.4cm width x 0.2cm depth; 2.777cm^3 volume. Character of Wound/Ulcer Post Debridement is improved. Severity of Tissue Post Debridement is: Fat layer exposed. Post procedure Diagnosis Wound #1: Same as Pre-Procedure Plan Wound Cleansing: Wound #1 Right,Lateral Malleolus: Clean wound with Normal Saline. May Shower, gently pat wound dry prior to applying new dressing. - wash with hibiclens daily and leave on 5 mins Wound #2 Right,Medial Malleolus: Clean wound with Normal Saline. May Shower, gently pat wound dry prior to applying new dressing. - wash with hibiclens and leave on 5 mins Anesthetic (add to Medication List): Wound #1 Right,Lateral Malleolus: Topical Lidocaine 4% cream applied to wound bed prior to debridement (In Clinic Only). Wound #2 Right,Medial Malleolus: Topical Lidocaine  4% cream applied to wound bed prior to debridement (In Clinic Only). Primary Wound Dressing: Wound #1 Right,Lateral Malleolus: Hydrafera Blue Ready Transfer Other: - Gentamicin cream to wound bed Wound #2 Right,Medial Malleolus: Hydrafera Blue Ready Transfer Other: - Gentamicin cream to wound bed Secondary Dressing: Wound #1 Right,Lateral Malleolus: Dry Gauze Conform/Kerlix Wound #2 Right,Medial Malleolus: Dry Gauze Conform/Kerlix Dressing Change Frequency: Wound #1 Right,Lateral Malleolus: Change dressing every other day. Wound #2 Right,Medial Malleolus: Change dressing every other day. Follow-up Appointments: Wound #1 Right,Lateral Malleolus: Return Appointment in 2 weeks. Wound #2 Right,Medial Malleolus: Return Appointment in 2 weeks. Edema Control: Wound #1 Right,Lateral Malleolus: Elevate legs to the level of the heart and pump ankles as often as possible Wound #2 Right,Medial Malleolus: Elevate legs to the level of the heart and  pump ankles as often as possible Dishner, Zekiah C. ([id number]) Additional Orders / Instructions: Wound #1 Right,Lateral Malleolus: Increase protein intake. Wound #2 Right,Medial Malleolus: Increase protein intake. I'm in a recommend that we continue with the above wound care measures for the next week. Patient is in agreement with that plan. My hope is that he will continue to show signs of good improvement. I believe that we've Artie checked into skin substitute for him possibly even Apligraf in the past. However I'm gonna double check and make sure as this would be something that I think could be beneficial for him as well. I just cannot remember for sure whether we ran this or not. Otherwise will see him back for follow-up. Please see above for specific wound care orders. We will see patient for re-evaluation in 2 week(s) here in the clinic. If anything worsens or changes patient will contact our office for additional  recommendations. Electronic Signature(s) Signed: 10/21/2018 9:39:30 AM By: Lenda Kelp PA-C Entered By: Lenda Kelp on 10/21/2018 08:42:08 [name] ([id number]) -------------------------------------------------------------------------------- ROS/PFSH Details Patient Name: [name] Date of Service: 10/21/2018 8:00 AM Medical Record Number: [id number] Patient Account Number: [id number] Date of Birth/Sex: June 23, 1946 (73 y.o. M) Treating RN: Curtis Sites Primary Care Provider: [name] Other Clinician: Referring Provider: [name] Treating Provider/Extender: STONE III, HOYT Weeks in Treatment: 144 Information Obtained From Patient Wound History Do you currently have one or more open woundso Yes How many open wounds do you currently haveo 1 Approximately how long have you had your woundso 5 months How have you been treating your wound(s) until nowo saline, dressing Has your wound(s) ever healed and then re-openedo Yes Have you had any lab work done in the past montho No Have you tested positive for an antibiotic resistant organism (MRSA, VRE)o No Have you tested positive for osteomyelitis (bone infection)o No Have you had any tests for circulation on your legso No Constitutional Symptoms (General Health) Complaints and Symptoms: Negative for: Fever; Chills Medical History: Past Medical History Notes: Vein Filter (groin area); CODA; H/O Blood Clots; pulmonary hypertensive arterial disease Eyes Medical History: Positive for: Cataracts - removed Negative for: Glaucoma; Optic Neuritis Ear/Nose/Mouth/Throat Medical History: Negative for: Chronic sinus problems/congestion Hematologic/Lymphatic Medical History: Negative for: Anemia; Hemophilia; Human Immunodeficiency Virus; Lymphedema; Sickle Cell Disease Respiratory Complaints and Symptoms: No Complaints or Symptoms Medical History: Positive for: Chronic Obstructive Pulmonary Disease  (COPD) Negative for: Aspiration; Asthma; Pneumothorax; Sleep Apnea; Tuberculosis Cardiovascular PHARRELL, LEDFORD. ([id number]) Complaints and Symptoms: No Complaints or Symptoms Medical History: Negative for: Angina; Arrhythmia; Congestive Heart Failure; Coronary Artery Disease; Deep Vein Thrombosis; Hypertension; Hypotension; Myocardial Infarction; Peripheral Arterial Disease; Peripheral Venous Disease; Phlebitis; Vasculitis Gastrointestinal Medical History: Negative for: Cirrhosis ; Colitis; Crohnos; Hepatitis A; Hepatitis B; Hepatitis C Endocrine Medical History: Negative for: Type I Diabetes; Type II Diabetes Genitourinary Medical History: Negative for: End Stage Renal Disease Immunological Medical History: Negative for: Lupus Erythematosus; Raynaudos Integumentary (Skin) Medical History: Negative for: History of Burn; History of pressure wounds Musculoskeletal Medical History: Positive for: Osteoarthritis Negative for: Gout; Rheumatoid Arthritis; Osteomyelitis Neurologic Medical History: Negative for: Dementia; Neuropathy; Quadriplegia; Paraplegia; Seizure Disorder Oncologic Medical History: Negative for: Received Chemotherapy; Received Radiation Psychiatric Complaints and Symptoms: No Complaints or Symptoms Medical History: Negative for: Anorexia/bulimia; Confinement Anxiety HBO Extended History Items Schiano, Conway C. ([id number]) Eyes: Cataracts Immunizations Pneumococcal Vaccine: Received Pneumococcal Vaccination: No Implantable Devices No devices added Family and Social History Former smoker - 10 years ago; Marital Status -  Married; Alcohol Use: Moderate; Drug Use: No History; Caffeine Use: Never; Advanced Directives: No; Patient does not want information on Advanced Directives; Living Will: No; Medical Power of Attorney: No Physician Affirmation I have reviewed and agree with the above information. Electronic Signature(s) Signed: 10/21/2018  9:39:30 AM By: Lenda Kelp PA-C Signed: 10/21/2018 5:30:34 PM By: Curtis Sites Entered By: Lenda Kelp on 10/21/2018 08:41:01 [name] ([id number]) -------------------------------------------------------------------------------- SuperBill Details Patient Name: [name] Date of Service: 10/21/2018 Medical Record Number: [id number] Patient Account Number: [id number] Date of Birth/Sex: 10/05/1945 (73 y.o. 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, sequela T81.31XS Disruption of external operation (surgical) wound, not elsewhere classified, sequela Facility Procedures CPT4 Code: 78295621 Description: 11042 - DEB SUBQ TISSUE 20 SQ CM/< ICD-10 Diagnosis Description L97.312 Non-pressure chronic ulcer of right ankle with fat layer ex Modifier: posed Quantity: 1 Physician Procedures CPT4 Code: 3086578 Description: 11042 - WC PHYS SUBQ TISS 20 SQ CM ICD-10 Diagnosis Description L97.312 Non-pressure chronic ulcer of right ankle with fat layer ex Modifier: posed Quantity: 1 Electronic Signature(s) Signed: 10/21/2018 9:39:30 AM By: Lenda Kelp PA-C Entered By: Lenda Kelp on 10/21/2018 08:42:19

## 2018-10-23 NOTE — Progress Notes (Signed)
VAREN, HINK ([id number]) Visit Report for 10/21/2018 Arrival Information Details Patient Name: [name], [name]. Date of Service: 10/21/2018 8:00 AM Medical Record Number: [id number] Patient Account Number: [id number] Date of Birth/Sex: 11/27/1945 (73 y.o. M) Treating RN: Rodell Perna Primary Care Sai Zinn: [name] Other Clinician: Referring Hiba Garry: [name] Treating Yassen Kinnett/Extender: Linwood Dibbles, HOYT Weeks in Treatment: 144 Visit Information History Since Last Visit All ordered tests and consults were completed: No Patient Arrived: Ambulatory Added or deleted any medications: No Arrival Time: 08:08 Any new allergies or adverse reactions: No Accompanied By: self Had a fall or experienced change in No Transfer Assistance: None activities of daily living that may affect Patient Requires Transmission-Based No risk of falls: Precautions: Signs or symptoms of abuse/neglect since last visito No Patient Has Alerts: Yes Hospitalized since last visit: No Patient Alerts: Patient on Blood Implantable device outside of the clinic excluding No Thinner cellular tissue based products placed in the center since last visit: Pain Present Now: No Electronic Signature(s) Signed: 10/22/2018 9:17:40 AM By: Rodell Perna Entered By: Rodell Perna on 10/21/2018 08:09:20 [name] ([id number]) -------------------------------------------------------------------------------- Encounter Discharge Information Details Patient Name: [name] Date of Service: 10/21/2018 8:00 AM Medical Record Number: [id number] Patient Account Number: [id number] Date of Birth/Sex: 1945/09/10 (73 y.o. M) Treating RN: Arnette Norris Primary Care Makaylynn Bonillas: [name] Other Clinician: Referring Liddie Chichester: [name] Treating Audie Stayer/Extender: Linwood Dibbles, HOYT Weeks in Treatment: 144 Encounter Discharge Information Items Post Procedure Vitals Discharge Condition: Stable Temperature  (F): 98.3 Ambulatory Status: Ambulatory Pulse (bpm): 74 Discharge Destination: Home Respiratory Rate (breaths/min): 16 Transportation: Private Auto Blood Pressure (mmHg): 145/68 Accompanied By: self Schedule Follow-up Appointment: Yes Clinical Summary of Care: Electronic Signature(s) Signed: 10/21/2018 11:13:25 AM By: Arnette Norris Entered By: Arnette Norris on 10/21/2018 08:50:19 [name] ([id number]) -------------------------------------------------------------------------------- Lower Extremity Assessment Details Patient Name: [name] Date of Service: 10/21/2018 8:00 AM Medical Record Number: [id number] Patient Account Number: [id number] Date of Birth/Sex: Nov 21, 1945 (73 y.o. M) Treating RN: Rodell Perna Primary Care Akhil Piscopo: [name] Other Clinician: Referring Tauheedah Bok: [name] Treating Kerilyn Cortner/Extender: STONE III, HOYT Weeks in Treatment: 144 Edema Assessment Assessed: [Left: No] [Right: No] Edema: [Left: N] [Right: o] Electronic Signature(s) Signed: 10/22/2018 9:17:40 AM By: Rodell Perna Entered By: Rodell Perna on 10/21/2018 08:18:09 [name] ([id number]) -------------------------------------------------------------------------------- Multi Wound Chart Details Patient Name: [name] Date of Service: 10/21/2018 8:00 AM Medical Record Number: [id number] Patient Account Number: [id number] Date of Birth/Sex: 1945/11/30 (73 y.o. M) Treating RN: Curtis Sites Primary Care Ladeidra Borys: [name] Other Clinician: Referring Ernestina Joe: [name] Treating Gradie Ohm/Extender: STONE III, HOYT Weeks in Treatment: 144 Vital Signs Height(in): 76 Pulse(bpm): 74 Weight(lbs): 238 Blood Pressure(mmHg): 145/68 Body Mass Index(BMI): 29 Temperature(F): 98.3 Respiratory Rate 16 (breaths/min): Photos: [1:No Photos] [2:No Photos] [N/A:N/A] Wound Location: [1:Right Malleolus - Lateral] [2:Right Malleolus - Medial]  [N/A:N/A] Wounding Event: [1:Gradually Appeared] [2:Gradually Appeared] [N/A:N/A] Primary Etiology: [1:Venous Leg Ulcer] [2:Venous Leg Ulcer] [N/A:N/A] Comorbid History: [1:Cataracts, Chronic Obstructive Cataracts, Chronic Obstructive N/A Pulmonary Disease (COPD), Pulmonary Disease (COPD), Osteoarthritis] [2:Osteoarthritis] Date Acquired: [1:08/11/2015] [2:01/30/2016] [N/A:N/A] Weeks of Treatment: [1:144] [2:142] [N/A:N/A] Wound Status: [1:Open] [2:Open] [N/A:N/A] Clustered Wound: [1:No] [2:Yes] [N/A:N/A] Measurements L x W x D [1:5.2x3.4x0.1] [2:2.3x1.5x0.2] [N/A:N/A] (cm) Area (cm) : [1:13.886] [2:2.71] [N/A:N/A] Volume (cm) : [1:1.389] [2:0.542] [N/A:N/A] % Reduction in Area: [1:19.60%] [2:76.50%] [N/A:N/A] % Reduction in Volume: [1:73.20%] [2:53.10%] [N/A:N/A] Classification: [1:Full Thickness Without Exposed Support Structures] [2:Full Thickness Without Exposed Support Structures] [N/A:N/A] Exudate Amount: [1:Medium] [2:Medium] [N/A:N/A]  Exudate Type: [1:Serous] [2:Serous] [N/A:N/A] Exudate Color: [1:amber] [2:amber] [N/A:N/A] Wound Margin: [1:Flat and Intact] [2:Flat and Intact] [N/A:N/A] Granulation Amount: [1:Medium (34-66%)] [2:None Present (0%)] [N/A:N/A] Granulation Quality: [1:Red] [2:N/A] [N/A:N/A] Necrotic Amount: [1:Medium (34-66%)] [2:Large (67-100%)] [N/A:N/A] Exposed Structures: [1:Fat Layer (Subcutaneous Tissue) Exposed: Yes Fascia: No Tendon: No Muscle: No Joint: No Bone: No] [2:Fat Layer (Subcutaneous Tissue) Exposed: Yes Fascia: No Tendon: No Muscle: No Joint: No Bone: No] [N/A:N/A] Epithelialization: [1:Small (1-33%)] [2:None] [N/A:N/A] Periwound Skin Texture: [1:Scarring: Yes Excoriation: No Induration: No] [2:Scarring: Yes Excoriation: No Induration: No] [N/A:N/A] Callus: No Callus: No Crepitus: No Crepitus: No Rash: No Rash: No Periwound Skin Moisture: Maceration: No Maceration: No N/A Dry/Scaly: No Dry/Scaly: No Periwound Skin Color: Hemosiderin  Staining: Yes Atrophie Blanche: No N/A Atrophie Blanche: No Cyanosis: No Cyanosis: No Ecchymosis: No Ecchymosis: No Erythema: No Erythema: No Hemosiderin Staining: No Mottled: No Mottled: No Pallor: No Pallor: No Rubor: No Rubor: No Temperature: No Abnormality No Abnormality N/A Tenderness on Palpation: No No N/A Wound Preparation: Ulcer Cleansing: Ulcer Cleansing: N/A Rinsed/Irrigated with Saline Rinsed/Irrigated with Saline Topical Anesthetic Applied: Topical Anesthetic Applied: Other: lidocaine 4% Other: lidocaine 4% Treatment Notes Electronic Signature(s) Signed: 10/21/2018 5:30:34 PM By: Curtis Sites Entered By: Curtis Sites on 10/21/2018 08:34:49 [name] ([id number]) -------------------------------------------------------------------------------- Multi-Disciplinary Care Plan Details Patient Name: [name] Date of Service: 10/21/2018 8:00 AM Medical Record Number: [id number] Patient Account Number: [id number] Date of Birth/Sex: [birth year] (73 y.o. M) Treating RN: Curtis Sites Primary Care Jessiah Wojnar: [name] Other Clinician: Referring Granville Whitefield: [name] Treating Anival Pasha/Extender: STONE III, HOYT Weeks in Treatment: 144 Active Inactive Venous Leg Ulcer Nursing Diagnoses: Knowledge deficit related to disease process and management Goals: Patient will maintain optimal edema control Date Initiated: 04/03/2016 Target Resolution Date: 11/24/2016 Goal Status: Active Interventions: Compression as ordered Treatment Activities: Therapeutic compression applied : 04/03/2016 Notes: Wound/Skin Impairment Nursing Diagnoses: Impaired tissue integrity Goals: Ulcer/skin breakdown will heal within 14 weeks Date Initiated: 01/17/2016 Target Resolution Date: 11/24/2016 Goal Status: Active Interventions: Assess ulceration(s) every visit Notes: Electronic Signature(s) Signed: 10/21/2018 5:30:34 PM By: Curtis Sites Entered By: Curtis Sites on 10/21/2018 08:34:32 [name] ([id number]) -------------------------------------------------------------------------------- Pain Assessment Details Patient Name: [name] Date of Service: 10/21/2018 8:00 AM Medical Record Number: [id number] Patient Account Number: [id number] Date of Birth/Sex: May 06, 1946 (72 y.o. M) Treating RN: Rodell Perna Primary Care Neno Hohensee: [name] Other Clinician: Referring Carmen Tolliver: [name] Treating Deasha Clendenin/Extender: STONE III, HOYT Weeks in Treatment: 144 Active Problems Location of Pain Severity and Description of Pain Patient Has Paino No Site Locations Pain Management and Medication Current Pain Management: Electronic Signature(s) Signed: 10/22/2018 9:17:40 AM By: Rodell Perna Entered By: Rodell Perna on 10/21/2018 08:09:26 [name] ([id number]) -------------------------------------------------------------------------------- Patient/Caregiver Education Details Patient Name: [name] Date of Service: 10/21/2018 8:00 AM Medical Record Number: [id number] Patient Account Number: [id number] Date of Birth/Gender: August 30, 1945 (73 y.o. M) Treating RN: Curtis Sites Primary Care Physician: [name] Other Clinician: Referring Physician: [name] Treating Physician/Extender: Skeet Simmer in Treatment: 144 Education Assessment Education Provided To: Patient Education Topics Provided Wound/Skin Impairment: Handouts: Other: wound care as ordered Methods: Demonstration, Explain/Verbal Responses: State content correctly Electronic Signature(s) Signed: 10/21/2018 11:13:25 AM By: Arnette Norris Entered By: Arnette Norris on 10/21/2018 08:50:24 [name] ([id number]) -------------------------------------------------------------------------------- Wound Assessment Details Patient Name: [name] Date of Service: 10/21/2018 8:00 AM Medical Record Number:  [id number] Patient Account Number: [id number] Date of Birth/Sex: 20-Mar-1946 (73 y.o. M) Treating RN: Rodell Perna  Primary Care Gillie Fleites: [name] Other Clinician: Referring Garnette Greb: [name] Treating Ahmiya Abee/Extender: STONE III, HOYT Weeks in Treatment: 144 Wound Status Wound Number: 1 Primary Venous Leg Ulcer Etiology: Wound Location: Right Malleolus - Lateral Wound Open Wounding Event: Gradually Appeared Status: Date Acquired: 08/11/2015 Comorbid Cataracts, Chronic Obstructive Pulmonary Weeks Of Treatment: 144 History: Disease (COPD), Osteoarthritis Clustered Wound: No Photos Photo Uploaded By: Rodell Perna on 10/21/2018 08:36:25 Wound Measurements Length: (cm) 5.2 Width: (cm) 3.4 Depth: (cm) 0.1 Area: (cm) 13.886 Volume: (cm) 1.389 % Reduction in Area: 19.6% % Reduction in Volume: 73.2% Epithelialization: Small (1-33%) Tunneling: No Undermining: No Wound Description Full Thickness Without Exposed Support Foul Odo Classification: Structures Slough/F Wound Margin: Flat and Intact Exudate Medium Amount: Exudate Type: Serous Exudate Color: amber r After Cleansing: No ibrino Yes Wound Bed Granulation Amount: Medium (34-66%) Exposed Structure Granulation Quality: Red Fascia Exposed: No Necrotic Amount: Medium (34-66%) Fat Layer (Subcutaneous Tissue) Exposed: Yes Necrotic Quality: Adherent Slough Tendon Exposed: No Muscle Exposed: No Joint Exposed: No Bone Exposed: No [name], [name] ([id number]) Periwound Skin Texture Texture Color No Abnormalities Noted: No No Abnormalities Noted: No Callus: No Atrophie Blanche: No Crepitus: No Cyanosis: No Excoriation: No Ecchymosis: No Induration: No Erythema: No Rash: No Hemosiderin Staining: Yes Scarring: Yes Mottled: No Pallor: No Moisture Rubor: No No Abnormalities Noted: No Dry / Scaly: No Temperature / Pain Maceration: No Temperature: No Abnormality Wound Preparation Ulcer  Cleansing: Rinsed/Irrigated with Saline Topical Anesthetic Applied: Other: lidocaine 4%, Treatment Notes Wound #1 (Right, Lateral Malleolus) Notes gentamicin cream to wound bed, hydrfera blue, ABD pad, and conform secured with Medipore tape. Electronic Signature(s) Signed: 10/22/2018 9:17:40 AM By: Rodell Perna Entered By: Rodell Perna on 10/21/2018 08:15:42 [name] ([id number]) -------------------------------------------------------------------------------- Wound Assessment Details Patient Name: [name] Date of Service: 10/21/2018 8:00 AM Medical Record Number: [id number] Patient Account Number: [id number] Date of Birth/Sex: 1945-11-01 (73 y.o. M) Treating RN: Rodell Perna Primary Care Zaidin Blyden: [name] Other Clinician: Referring Dwayne Begay: [name] Treating Curry Seefeldt/Extender: STONE III, HOYT Weeks in Treatment: 144 Wound Status Wound Number: 2 Primary Venous Leg Ulcer Etiology: Wound Location: Right Malleolus - Medial Wound Open Wounding Event: Gradually Appeared Status: Date Acquired: 01/30/2016 Comorbid Cataracts, Chronic Obstructive Pulmonary Weeks Of Treatment: 142 History: Disease (COPD), Osteoarthritis Clustered Wound: Yes Photos Photo Uploaded By: Rodell Perna on 10/21/2018 08:36:25 Wound Measurements Length: (cm) 2.3 Width: (cm) 1.5 Depth: (cm) 0.2 Area: (cm) 2.71 Volume: (cm) 0.542 % Reduction in Area: 76.5% % Reduction in Volume: 53.1% Epithelialization: None Tunneling: No Undermining: No Wound Description Full Thickness Without Exposed Support Foul Odo Classification: Structures Slough/F Wound Margin: Flat and Intact Exudate Medium Amount: Exudate Type: Serous Exudate Color: amber r After Cleansing: No ibrino Yes Wound Bed Granulation Amount: None Present (0%) Exposed Structure Necrotic Amount: Large (67-100%) Fascia Exposed: No Necrotic Quality: Adherent Slough Fat Layer (Subcutaneous Tissue) Exposed:  Yes Tendon Exposed: No Muscle Exposed: No Joint Exposed: No Bone Exposed: No [name], [name] ([id number]) Periwound Skin Texture Texture Color No Abnormalities Noted: No No Abnormalities Noted: No Callus: No Atrophie Blanche: No Crepitus: No Cyanosis: No Excoriation: No Ecchymosis: No Induration: No Erythema: No Rash: No Hemosiderin Staining: No Scarring: Yes Mottled: No Pallor: No Moisture Rubor: No No Abnormalities Noted: No Dry / Scaly: No Temperature / Pain Maceration: No Temperature: No Abnormality Wound Preparation Ulcer Cleansing: Rinsed/Irrigated with Saline Topical Anesthetic Applied: Other: lidocaine 4%, Treatment Notes Wound #2 (Right, Medial Malleolus) Notes gentamicin cream to wound bed, hydrfera blue, ABD  pad, and conform secured with Medipore tape. Electronic Signature(s) Signed: 10/22/2018 9:17:40 AM By: Rodell Perna Entered By: Rodell Perna on 10/21/2018 08:16:09 [name] ([id number]) -------------------------------------------------------------------------------- Vitals Details Patient Name: [name] Date of Service: 10/21/2018 8:00 AM Medical Record Number: [id number] Patient Account Number: [id number] Date of Birth/Sex: 05-19-1946 (73 y.o. M) Treating RN: Rodell Perna Primary Care Adekunle Rohrbach: [name] Other Clinician: Referring Nikolaj Geraghty: [name] Treating Tayja Manzer/Extender: STONE III, HOYT Weeks in Treatment: 144 Vital Signs Time Taken: 08:08 Temperature (F): 98.3 Height (in): 76 Pulse (bpm): 74 Weight (lbs): 238 Respiratory Rate (breaths/min): 16 Body Mass Index (BMI): 29 Blood Pressure (mmHg): 145/68 Reference Range: 80 - 120 mg / dl Electronic Signature(s) Signed: 10/22/2018 9:17:40 AM By: Rodell Perna Entered By: Rodell Perna on 10/21/2018 08:09:45

## 2018-11-04 ENCOUNTER — Other Ambulatory Visit
Admission: RE | Admit: 2018-11-04 | Discharge: 2018-11-04 | Disposition: A | Discharge status: Home / Self Care | Payer: Medicare HMO | Source: Ambulatory Visit | Attending: Physician Assistant | Admitting: Physician Assistant

## 2018-11-04 ENCOUNTER — Encounter: Payer: Medicare HMO | Attending: Physician Assistant | Admitting: Physician Assistant

## 2018-11-04 DIAGNOSIS — M1990 Unspecified osteoarthritis, unspecified site: Secondary | ICD-10-CM | POA: Diagnosis not present

## 2018-11-04 DIAGNOSIS — I10 Essential (primary) hypertension: Secondary | ICD-10-CM | POA: Diagnosis not present

## 2018-11-04 DIAGNOSIS — Z7901 Long term (current) use of anticoagulants: Secondary | ICD-10-CM | POA: Diagnosis not present

## 2018-11-04 DIAGNOSIS — E11622 Type 2 diabetes mellitus with other skin ulcer: Secondary | ICD-10-CM | POA: Diagnosis not present

## 2018-11-04 DIAGNOSIS — Z87891 Personal history of nicotine dependence: Secondary | ICD-10-CM | POA: Diagnosis not present

## 2018-11-04 DIAGNOSIS — J449 Chronic obstructive pulmonary disease, unspecified: Secondary | ICD-10-CM | POA: Insufficient documentation

## 2018-11-04 DIAGNOSIS — I872 Venous insufficiency (chronic) (peripheral): Secondary | ICD-10-CM | POA: Diagnosis not present

## 2018-11-04 DIAGNOSIS — B999 Unspecified infectious disease: Secondary | ICD-10-CM | POA: Diagnosis present

## 2018-11-04 DIAGNOSIS — Z888 Allergy status to other drugs, medicaments and biological substances status: Secondary | ICD-10-CM | POA: Diagnosis not present

## 2018-11-04 DIAGNOSIS — L97312 Non-pressure chronic ulcer of right ankle with fat layer exposed: Secondary | ICD-10-CM | POA: Insufficient documentation

## 2018-11-04 DIAGNOSIS — Z86718 Personal history of other venous thrombosis and embolism: Secondary | ICD-10-CM | POA: Insufficient documentation

## 2018-11-06 NOTE — Progress Notes (Signed)
[name], [name] ([id number]) Visit Report for 11/04/2018 Arrival Information Details Patient Name: [name], [name]. Date of Service: 11/04/2018 8:00 AM Medical Record Number: [id number] Patient Account Number: [id number] Date of Birth/Sex: 06/19/1946 (73 y.o. M) Treating RN: Curtis Sites Primary Care Joselynne Killam: [name] Other Clinician: Referring Abeera Flannery: [name] Treating Vishruth Seoane/Extender: Linwood Dibbles, HOYT Weeks in Treatment: 146 Visit Information History Since Last Visit Added or deleted any medications: No Patient Arrived: Ambulatory Any new allergies or adverse reactions: No Arrival Time: 08:07 Had a fall or experienced change in No Accompanied By: self activities of daily living that may affect Transfer Assistance: None risk of falls: Patient Identification Verified: Yes Signs or symptoms of abuse/neglect since last visito No Secondary Verification Process Yes Hospitalized since last visit: No Completed: Implantable device outside of the clinic excluding No Patient Requires Transmission-Based No cellular tissue based products placed in the center Precautions: since last visit: Patient Has Alerts: Yes Has Dressing in Place as Prescribed: Yes Patient Alerts: Patient on Blood Pain Present Now: Yes Thinner Electronic Signature(s) Signed: 11/04/2018 3:28:00 PM By: Dayton Martes RCP, RRT, CHT Entered By: Dayton Martes on 11/04/2018 08:08:29 [name] ([id number]) -------------------------------------------------------------------------------- Encounter Discharge Information Details Patient Name: [name] Date of Service: 11/04/2018 8:00 AM Medical Record Number: [id number] Patient Account Number: [id number] Date of Birth/Sex: 03/28/1946 (73 y.o. M) Treating RN: Curtis Sites Primary Care Tima Curet: [name] Other Clinician: Referring Quintarius Ferns: [name] Treating Ranessa Kosta/Extender: Linwood Dibbles,  HOYT Weeks in Treatment: 146 Encounter Discharge Information Items Post Procedure Vitals Discharge Condition: Stable Temperature (F): 98.0 Ambulatory Status: Ambulatory Pulse (bpm): 73 Discharge Destination: Home Respiratory Rate (breaths/min): 16 Transportation: Private Auto Blood Pressure (mmHg): 133/70 Accompanied By: self Schedule Follow-up Appointment: Yes Clinical Summary of Care: Electronic Signature(s) Signed: 11/05/2018 4:23:17 PM By: Curtis Sites Entered By: Curtis Sites on 11/04/2018 08:42:51 [name] ([id number]) -------------------------------------------------------------------------------- Lower Extremity Assessment Details Patient Name: [name] Date of Service: 11/04/2018 8:00 AM Medical Record Number: [id number] Patient Account Number: [id number] Date of Birth/Sex: 02-21-1946 (73 y.o. M) Treating RN: Rodell Perna Primary Care Miki Labuda: [name] Other Clinician: Referring Jerika Wales: [name] Treating Brailynn Breth/Extender: STONE III, HOYT Weeks in Treatment: 146 Edema Assessment Assessed: [Left: No] [Right: No] Edema: [Left: N] [Right: o] Vascular Assessment Pulses: Dorsalis Pedis Palpable: [Right:Yes] Posterior Tibial Extremity colors, hair growth, and conditions: Extremity Color: [Right:Hyperpigmented] Hair Growth on Extremity: [Right:No] Temperature of Extremity: [Right:Warm] Capillary Refill: [Right:< 3 seconds] Toe Nail Assessment Left: Right: Thick: Yes Discolored: Yes Deformed: No Improper Length and Hygiene: No Electronic Signature(s) Signed: 11/04/2018 10:08:09 AM By: Rodell Perna Entered By: Rodell Perna on 11/04/2018 08:17:38 [name] ([id number]) -------------------------------------------------------------------------------- Multi Wound Chart Details Patient Name: [name] Date of Service: 11/04/2018 8:00 AM Medical Record Number: [id number] Patient Account Number: [id number] Date of  Birth/Sex: 05/26/1946 (73 y.o. M) Treating RN: Curtis Sites Primary Care Anneliese Leblond: [name] Other Clinician: Referring Katee Wentland: [name] Treating Kaelem Brach/Extender: STONE III, HOYT Weeks in Treatment: 146 Vital Signs Height(in): 76 Pulse(bpm): 73 Weight(lbs): 238 Blood Pressure(mmHg): 133/70 Body Mass Index(BMI): 29 Temperature(F): 98.0 Respiratory Rate 16 (breaths/min): Photos: [1:No Photos] [2:No Photos] [N/A:N/A] Wound Location: [1:Right Malleolus - Lateral] [2:Right Malleolus - Medial] [N/A:N/A] Wounding Event: [1:Gradually Appeared] [2:Gradually Appeared] [N/A:N/A] Primary Etiology: [1:Venous Leg Ulcer] [2:Venous Leg Ulcer] [N/A:N/A] Comorbid History: [1:Cataracts, Chronic Obstructive Cataracts, Chronic Obstructive N/A Pulmonary Disease (COPD), Pulmonary Disease (COPD), Osteoarthritis] [2:Osteoarthritis] Date Acquired: [1:08/11/2015] [2:01/30/2016] [N/A:N/A] Weeks of Treatment: [1:146] [2:144] [N/A:N/A] Wound Status: [1:Open] [2:Open] [  N/A:N/A] Clustered Wound: [1:No] [2:Yes] [N/A:N/A] Measurements L x W x D [1:6.5x2.7x0.3] [2:1.9x1.5x0.2] [N/A:N/A] (cm) Area (cm) : [1:13.784] [2:2.238] [N/A:N/A] Volume (cm) : [1:4.135] [2:0.448] [N/A:N/A] % Reduction in Area: [1:20.20%] [2:80.60%] [N/A:N/A] % Reduction in Volume: [1:20.20%] [2:61.20%] [N/A:N/A] Classification: [1:Full Thickness Without Exposed Support Structures] [2:Full Thickness Without Exposed Support Structures] [N/A:N/A] Exudate Amount: [1:Medium] [2:Medium] [N/A:N/A] Exudate Type: [1:Serous] [2:Serous] [N/A:N/A] Exudate Color: [1:amber] [2:amber] [N/A:N/A] Wound Margin: [1:Flat and Intact] [2:Flat and Intact] [N/A:N/A] Granulation Amount: [1:Small (1-33%)] [2:Small (1-33%)] [N/A:N/A] Granulation Quality: [1:Pink] [2:Red] [N/A:N/A] Necrotic Amount: [1:Large (67-100%)] [2:Large (67-100%)] [N/A:N/A] Exposed Structures: [1:Fat Layer (Subcutaneous Tissue) Exposed: Yes Fascia: No Tendon: No Muscle: No  Joint: No Bone: No] [2:Fat Layer (Subcutaneous Tissue) Exposed: Yes Fascia: No Tendon: No Muscle: No Joint: No Bone: No] [N/A:N/A] Epithelialization: [1:Small (1-33%)] [2:None] [N/A:N/A] Periwound Skin Texture: [1:Scarring: Yes Excoriation: No Induration: No] [2:Scarring: Yes Excoriation: No Induration: No] [N/A:N/A] Callus: No Callus: No Crepitus: No Crepitus: No Rash: No Rash: No Periwound Skin Moisture: Maceration: No Maceration: No N/A Dry/Scaly: No Dry/Scaly: No Periwound Skin Color: Hemosiderin Staining: Yes Atrophie Blanche: No N/A Atrophie Blanche: No Cyanosis: No Cyanosis: No Ecchymosis: No Ecchymosis: No Erythema: No Erythema: No Hemosiderin Staining: No Mottled: No Mottled: No Pallor: No Pallor: No Rubor: No Rubor: No Temperature: No Abnormality No Abnormality N/A Tenderness on Palpation: No No N/A Wound Preparation: Ulcer Cleansing: Ulcer Cleansing: N/A Rinsed/Irrigated with Saline Rinsed/Irrigated with Saline Topical Anesthetic Applied: Topical Anesthetic Applied: Other: lidocaine 4% Other: lidocaine 4% Treatment Notes Electronic Signature(s) Signed: 11/05/2018 4:23:17 PM By: Curtis Sites Entered By: Curtis Sites on 11/04/2018 08:30:19 [name] ([id number]) -------------------------------------------------------------------------------- Multi-Disciplinary Care Plan Details Patient Name: [name] Date of Service: 11/04/2018 8:00 AM Medical Record Number: [id number] Patient Account Number: [id number] Date of Birth/Sex: 10/20/1945 (73 y.o. M) Treating RN: Curtis Sites Primary Care Kamaree Berkel: [name] Other Clinician: Referring Arik Husmann: [name] Treating Nichalas Coin/Extender: STONE III, HOYT Weeks in Treatment: 146 Active Inactive Venous Leg Ulcer Nursing Diagnoses: Knowledge deficit related to disease process and management Goals: Patient will maintain optimal edema control Date Initiated: 04/03/2016 Target  Resolution Date: 11/24/2016 Goal Status: Active Interventions: Compression as ordered Treatment Activities: Therapeutic compression applied : 04/03/2016 Notes: Wound/Skin Impairment Nursing Diagnoses: Impaired tissue integrity Goals: Ulcer/skin breakdown will heal within 14 weeks Date Initiated: 01/17/2016 Target Resolution Date: 11/24/2016 Goal Status: Active Interventions: Assess ulceration(s) every visit Notes: Electronic Signature(s) Signed: 11/05/2018 4:23:17 PM By: Curtis Sites Entered By: Curtis Sites on 11/04/2018 08:30:09 [name] ([id number]) -------------------------------------------------------------------------------- Pain Assessment Details Patient Name: [name] Date of Service: 11/04/2018 8:00 AM Medical Record Number: [id number] Patient Account Number: [id number] Date of Birth/Sex: 28-Jun-1946 (73 y.o. M) Treating RN: Curtis Sites Primary Care Angeliyah Kirkey: [name] Other Clinician: Referring Bertel Venard: [name] Treating Gearald Stonebraker/Extender: STONE III, HOYT Weeks in Treatment: 146 Active Problems Location of Pain Severity and Description of Pain Patient Has Paino Yes Site Locations Pain Management and Medication Current Pain Management: Electronic Signature(s) Signed: 11/04/2018 3:28:00 PM By: Sallee Provencal, RRT, CHT Signed: 11/05/2018 4:23:17 PM By: Curtis Sites Entered By: Dayton Martes on 11/04/2018 08:08:38 [name] ([id number]) -------------------------------------------------------------------------------- Patient/Caregiver Education Details Patient Name: [name] Date of Service: 11/04/2018 8:00 AM Medical Record Number: [id number] Patient Account Number: [id number] Date of Birth/Gender: [birth year] (73 y.o. M) Treating RN: Curtis Sites Primary Care Physician: [name] Other Clinician: Referring Physician: [name] Treating Physician/Extender: Linwood Dibbles,  HOYT Weeks in Treatment: 146 Education Assessment Education Provided To: Patient  Education Topics Provided Wound/Skin Impairment: Handouts: Other: wound care as ordered Methods: Demonstration, Explain/Verbal Responses: State content correctly Electronic Signature(s) Signed: 11/05/2018 4:23:17 PM By: Curtis Sites Entered By: Curtis Sites on 11/04/2018 08:39:36 [name] ([id number]) -------------------------------------------------------------------------------- Wound Assessment Details Patient Name: [name] Date of Service: 11/04/2018 8:00 AM Medical Record Number: [id number] Patient Account Number: [id number] Date of Birth/Sex: July 19, 1946 (73 y.o. M) Treating RN: Rodell Perna Primary Care Bless Belshe: [name] Other Clinician: Referring Hartman Minahan: [name] Treating Detron Carras/Extender: STONE III, HOYT Weeks in Treatment: 146 Wound Status Wound Number: 1 Primary Venous Leg Ulcer Etiology: Wound Location: Right Malleolus - Lateral Wound Open Wounding Event: Gradually Appeared Status: Date Acquired: 08/11/2015 Comorbid Cataracts, Chronic Obstructive Pulmonary Weeks Of Treatment: 146 History: Disease (COPD), Osteoarthritis Clustered Wound: No Photos Photo Uploaded By: Rodell Perna on 11/04/2018 08:31:09 Wound Measurements Length: (cm) 6.5 Width: (cm) 2.7 Depth: (cm) 0.3 Area: (cm) 13.784 Volume: (cm) 4.135 % Reduction in Area: 20.2% % Reduction in Volume: 20.2% Epithelialization: Small (1-33%) Tunneling: No Undermining: No Wound Description Full Thickness Without Exposed Support Foul Odo Classification: Structures Slough/F Wound Margin: Flat and Intact Exudate Medium Amount: Exudate Type: Serous Exudate Color: amber r After Cleansing: No ibrino Yes Wound Bed Granulation Amount: Small (1-33%) Exposed Structure Granulation Quality: Pink Fascia Exposed: No Necrotic Amount: Large (67-100%) Fat Layer (Subcutaneous Tissue)  Exposed: Yes Necrotic Quality: Adherent Slough Tendon Exposed: No Muscle Exposed: No Joint Exposed: No Bone Exposed: No Sawyer, Ulices C. ([id number]) Periwound Skin Texture Texture Color No Abnormalities Noted: No No Abnormalities Noted: No Callus: No Atrophie Blanche: No Crepitus: No Cyanosis: No Excoriation: No Ecchymosis: No Induration: No Erythema: No Rash: No Hemosiderin Staining: Yes Scarring: Yes Mottled: No Pallor: No Moisture Rubor: No No Abnormalities Noted: No Dry / Scaly: No Temperature / Pain Maceration: No Temperature: No Abnormality Wound Preparation Ulcer Cleansing: Rinsed/Irrigated with Saline Topical Anesthetic Applied: Other: lidocaine 4%, Treatment Notes Wound #1 (Right, Lateral Malleolus) Notes gentamicin cream to wound bed, hydrfera blue, ABD pad, and conform secured with Medipore tape. Electronic Signature(s) Signed: 11/04/2018 10:08:09 AM By: Rodell Perna Entered By: Rodell Perna on 11/04/2018 08:16:19 [name] ([id number]) -------------------------------------------------------------------------------- Wound Assessment Details Patient Name: [name] Date of Service: 11/04/2018 8:00 AM Medical Record Number: [id number] Patient Account Number: [id number] Date of Birth/Sex: 19-Aug-1946 (73 y.o. M) Treating RN: Rodell Perna Primary Care Aizlyn Schifano: [name] Other Clinician: Referring Kristelle Cavallaro: [name] Treating Jaaziah Schulke/Extender: STONE III, HOYT Weeks in Treatment: 146 Wound Status Wound Number: 2 Primary Venous Leg Ulcer Etiology: Wound Location: Right Malleolus - Medial Wound Open Wounding Event: Gradually Appeared Status: Date Acquired: 01/30/2016 Comorbid Cataracts, Chronic Obstructive Pulmonary Weeks Of Treatment: 144 History: Disease (COPD), Osteoarthritis Clustered Wound: Yes Photos Photo Uploaded By: Rodell Perna on 11/04/2018 08:31:10 Wound Measurements Length: (cm) 1.9 Width: (cm) 1.5 Depth:  (cm) 0.2 Area: (cm) 2.238 Volume: (cm) 0.448 % Reduction in Area: 80.6% % Reduction in Volume: 61.2% Epithelialization: None Tunneling: No Undermining: No Wound Description Full Thickness Without Exposed Support Foul Odo Classification: Structures Slough/F Wound Margin: Flat and Intact Exudate Medium Amount: Exudate Type: Serous Exudate Color: amber r After Cleansing: No ibrino Yes Wound Bed Granulation Amount: Small (1-33%) Exposed Structure Granulation Quality: Red Fascia Exposed: No Necrotic Amount: Large (67-100%) Fat Layer (Subcutaneous Tissue) Exposed: Yes Necrotic Quality: Adherent Slough Tendon Exposed: No Muscle Exposed: No Joint Exposed: No Bone Exposed: No Donaghue, Daxx C. ([id number]) Periwound Skin Texture Texture Color No Abnormalities Noted: No No Abnormalities Noted: No Callus: No  Atrophie Blanche: No Crepitus: No Cyanosis: No Excoriation: No Ecchymosis: No Induration: No Erythema: No Rash: No Hemosiderin Staining: No Scarring: Yes Mottled: No Pallor: No Moisture Rubor: No No Abnormalities Noted: No Dry / Scaly: No Temperature / Pain Maceration: No Temperature: No Abnormality Wound Preparation Ulcer Cleansing: Rinsed/Irrigated with Saline Topical Anesthetic Applied: Other: lidocaine 4%, Treatment Notes Wound #2 (Right, Medial Malleolus) Notes gentamicin cream to wound bed, hydrfera blue, ABD pad, and conform secured with Medipore tape. Electronic Signature(s) Signed: 11/04/2018 10:08:09 AM By: Rodell Perna Entered By: Rodell Perna on 11/04/2018 08:17:12 [name] ([id number]) -------------------------------------------------------------------------------- Vitals Details Patient Name: [name] Date of Service: 11/04/2018 8:00 AM Medical Record Number: [id number] Patient Account Number: [id number] Date of Birth/Sex: [birth year] (73 y.o. M) Treating RN: Curtis Sites Primary Care Dahlila Pfahler: [name]  Other Clinician: Referring Jag Lenz: [name] Treating Jehan Ranganathan/Extender: STONE III, HOYT Weeks in Treatment: 146 Vital Signs Time Taken: 08:08 Temperature (F): 98.0 Height (in): 76 Pulse (bpm): 73 Weight (lbs): 238 Respiratory Rate (breaths/min): 16 Body Mass Index (BMI): 29 Blood Pressure (mmHg): 133/70 Reference Range: 80 - 120 mg / dl Electronic Signature(s) Signed: 11/04/2018 3:28:00 PM By: Dayton Martes RCP, RRT, CHT Entered By: Dayton Martes on 11/04/2018 08:11:15

## 2018-11-06 NOTE — Progress Notes (Signed)
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 He is not a diabetic but does have a history of blood clots and is on chronic Coumadin and  also has an IVC filter in place. Wound is quite extensive measuring 5. 4 x 4 by 0.3. They have been using some thermal wound product and sprayed that the obtained on the Internet for the last 5-6 monthsing much progress. This started as a small open wound that expanded. 01/24/16; the patient is been receiving Santyl changed daily by his wife. Continue debridement. Patient has no complaints 01/31/16; the patient arrives with irritation on the medial aspect of his ankle noticed by her intake nurse. The patient is noted pain in the area over the last day or 2. There are four new tiny wounds in this area. His co-pay for TheraSkin application is really high I think beyond her means 02/07/16; patient is improved C+S cultures MSSA completed Doxy. using iodoflex 02/15/16; patient arrived today with the wound and roughly the same condition. Extensive area on the right lateral foot and ankle. Using Iodoflex. He came in last week with a cluster of new wounds on the medial aspect of the same ankle. 02/22/16; once again the patient complains of a lot of drainage coming out of this wound. We brought him back in on Friday for a dressing change has been using Iodoflex. States his pain level is better 02/29/16; still complaining of a lot of drainage even though we are putting absorbent material over the Santyl and bringing him back on Fridays for dressing changes. He is not complaining of pain. Her intake nurse notes blistering 03/07/16: pt returns today for f/u. he admits out in rain on Saturday and soaked his right leg. he did not share with his wife and he didn't notify the [facility]. he has an odor today that is c/w pseudomonas. Wound has greenish tan slough. there is no periwound erythema, induration, or fluctuance. wound has deteriorated since previous visit. denies fever, chills, body aches or malaise. no increased pain. 03/13/16: C+S showed proteus. He has not received AB'S. Switched to RTD last week. 03/27/16 patient is  been using Iodoflex. Wound bed has improved and debridement is certainly easier 04/10/2016 -- he has been scheduled for a venous duplex study towards the end of the month 04/17/16; has been using silver alginate, states that the Iodoflex was hurting his wound and since that is been changed he has had no pain unfortunately the surface of the wound continues to be unhealthy with thick gelatinous slough and nonviable tissue. The wound will not heal like this. 04/20/2016 -- the patient was here for a nurse visit but I was asked to see the patient as the slough was quite significant and the nurse needed for clarification regarding the ointment to be used. 04/24/16; the patient's wounds on the right medial and right lateral ankle/malleolus both look a lot better today. Less adherent slough healthier tissue. Dimensions better especially medially 05/01/16; the patient's wound surface continues to improve however he continues to require debridement switch her easier each week. Continue Santyl/Metahydrin mixture Hydrofera Blue next week. Still drainage on the medial aspect according to the intake nurse 05/08/16; still using Santyl and Medihoney. Still a lot of drainage per her intake nurse. Patient has no complaints pain fever chills etc. 05/15/16 switched the Hydrofera Blue last week. Dimensions down especially in the  medial right leg wound. Area on the lateral which is more substantial also looks better still requires debridement 05/22/16; we have been using Hydrofera Blue. 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 Currently he tells me that the pain is doing very well about a 1 out of 10 today. Yesterday was a little bit worse but he tells me that he was more active watering his flowers that day. Overall  he feels that his symptoms are doing significantly better at this point in time. His edema continues to be controlled well with the 4-layer compression wrap and he really has not noted any odor at this point in time. He is tolerating the dressing changes when they are performed well. 06/05/16 at this point in time today patient currently shows no interval signs or symptoms of local or systemic infection. Again his pain level he rates to be a 1 out of 10 at most and overall he tells me that generally this is not giving him much trouble. In fact he even feels maybe a little bit better than last week. We have continue with the 4-layer compression wrap in which she tolerates very well at this point. He is continuing to utilize the [place]. 06/12/16 I think there has been some progression in the status of both of these wounds over today again covered in a gelatinous surface. Has been using Hydrofera Blue. We had used Iodoflex in the past I'm not sure if there was an issue other than changing to something that might progress towards closure faster 06/19/16; he did not tolerate the Flexeril last week secondary to pain and this was changed on Friday back to [facility] area he continues to have copious amounts of gelatinous surface slough which is think inhibiting the speed of healing this area 06/26/16 patient over the last week has utilized the Santyl to try to loosen up some of the tightly adherent slough that was noted on evaluation last week. The good news is he tells me that the medial malleoli region really does not bother him the right llateral malleoli region is more tender to palpation at this point in time especially in the central/inferior location. However it does appear that the Santyl has done his job to loosen up the adherent slough at this point in time. Fortunately he has no interval signs or symptoms of infection locally or systemically no purulent discharge  noted. 07/03/16 at this point in time today patient's wounds appear to be significantly improved over the right medial and lateral malleolus locations. He has much less tenderness at this point in time and the wounds appear clean her although there is still adherent slough this is sufficiently improved over what I saw last week. I still see no evidence of local infection. 07/10/16; continued gradual improvement in the right medial and lateral malleolus locations. The lateral is more substantial wound now divided into 2 by a rim of normal epithelialization. 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 He denies any complaints of pain or intolerance to compression. Both ulcers are smaller in size oriented today's measurements, both are covered with a softly adherent slough. 07/24/16; medial wound is smaller, lateral about the same although surface looks better. Still using Hydrofera  Blue 07/31/16; arrives today complaining of pain in the lateral part of his foot. Nurse reports a lot more drainage. He has been using Hydrofera Blue. Switch to silver alginate today 08/03/2016 -- I was asked to see the patient was here for a nurse visit today. I understand he had a lot of pain in his right lower extremity and was having blisters on his right foot which have not been there before. Though he started on doxycycline he does not have blisters elsewhere on his body. I do not believe this is a drug allergy. also mentioned that there was a copious purulent discharge from the wound and clinically there is no evidence of cellulitis. 08/07/16; I note that the patient came in for his nurse check on Friday apparently with blisters on his toes on the right than a lot of swelling in his forefoot. He continued on the doxycycline that I had prescribed on 12/8. A culture was done of the lateral wound that  showed a combination of a few Proteus and Pseudomonas. Doxycycline might of covered the Proteus but would be unlikely to cover the Pseudomonas. He is on Coumadin. He arrives in the clinic today feeling a lot better states the pain is a lot better but nothing specific really was done other than to rewrap the foot also noted that he had arterial studies ordered in August although these were never done. It is reasonable to go ahead and reorder these. 08/14/16; generally arrives in a better state today in terms of the wounds he has taken cefdinir for one week. Our intake nurse reports copious amounts of drainage but the patient is complaining of much less pain. He is not had his PT and INR checked and I've asked him to do this today or tomorrow. 08/24/2016 -- patient arrives today after 10 days and said he had a stomach upset. His arterial study was done and I have reviewed this report and find it to be within normal limits. However I did not note any venous duplex studies for reflux, and Dr. Leanord Hawking may have ordered these in the past but I will leave it to him to decide if he needs these. The patient has finished his course of cefdinir. 08/28/16; patient arrives today again with copious amounts of thick really green drainage for our intake nurse. He states he has a very tender spot at the superior part of the lateral wound. Wounds are larger 09/04/16; no real change in the condition of this patient's wound still copious amounts of surface slough. Started him on Iodoflex last week he is completing another course of Cefdinir or which I think was done empirically. His arterial study showed ABIs were 1.1 on the right 1.5 on the left. He did have a slightly reduced ABI in the right the left one was not obtained. Had calcification of the right posterior tibial artery. The interpretation was no segmental stenosis. His waveforms were triphasic. CHANSON, TEEMS ([id number]) His reflux studies are later this  month. Depending on this I'll send him for a vascular consultation, he may need to see plastic surgery as I believe he is had plastic surgery on this foot in the past. He had an injury to the foot in the 1980s. 1/16 /18 right lateral greater than right medial ankle wounds on the right in the setting of previous skin grafting. 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  He does not have arterial issues. Each week he comes in with the same adherent surface slough although there was less of this today 09/18/16; right lateral greater than right medial lower extremity wounds in the setting of previous skin grafting and trauma. He has least to vein laser ablation scheduled for February 2 for venous reflux. He does not have significant arterial disease. Problem has been very difficult to handle surface slough/necrotic tissue. Recently using Iodoflex for this with some, albeit slow improvement 09/25/16; right lateral greater than right medial lower extremity venous wounds in the setting of previous skin grafting. He is going for ablation surgery on February 2 after this he'll come back here for rewrap. He has been using Iodoflex as the primary dressing. 10/02/16; right lateral greater than right medial lower extremity wounds in the setting of previous skin grafting. He had his ablation surgery last week, I don't have a report. He tolerated this well. Came in with a thigh-high Unna boots on Friday. We have been using Iodoflex as the primary dressing. His measurements are improving 10/09/16; continues to make nice aggressive in terms of the wounds on his lateral and medial right ankle in the setting of previous skin grafting. Yesterday he noticed drainage at one of his surgical sites from his venous ablation on the right calf. He took off the bandage over this area felt a "popping" sensation and a reddish-brown drainage. He is not  complaining of any pain 10/16/16; he continues to make nice progression in terms of the wounds on the lateral and medial malleolus. Both smaller using Iodoflex. He had a surgical area in his posterior mid calf we have been using iodoform. All the wounds are down and dimensions 10/23/16; the patient arrives today with no complaints. He states the Iodoflex is a bit uncomfortable. He is not systemically unwell. We have been using Iodoflex to the lateral right ankle and the medial and Aquacel Ag to the reflux surgical wound on the posterior right calf. All of these wounds are doing well 10/30/16; patient states he has no pain no systemic symptoms. I changed him to [facility] last week. Although the wounds are doing well 11/06/16; patient reports no pain or systemic symptoms. We continue with Hydrofera Blue. Both wound areas on the medial and lateral ankle appear to be doing well with improvement and dimensions and improvement in the wound bed. 11/13/16; patient's dimensions continued to improve. We continue with Hydrofera Blue on the medial and lateral side. Appear to be doing well with healthy granulation and advancing epithelialization 11/20/16; patient's dimensions improving laterally by about half a centimeter in length. Otherwise no change on the medial side. Using [facility] 12/04/16; no major change in patient's wound dimensions. Intake nurse reports more drainage. 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 He is voicing no complaints or concerns. He has been tolerating his twice weekly compression therapy changes 12/11/16 Patient complains of pain and increased drainage.. wants hydrofera blue 12/18/16 improvement. Sorbact 12/25/16; medial wound is smaller, lateral measures the same. Still on sorbact 01/01/17; medial wound continues to be smaller, lateral measures about the same however  there is clearly advancing epithelialization here as well in fact I think the wound will ultimately divided into 2 open areas 01/08/17; unfortunately today fairly significant regression in several areas. Surface of the lateral wound covered again in adherent necrotic material which is difficult to debridement. He has  significant surrounding skin maceration. The expanding area of tissue epithelialization in the middle of the wound that was encouraging last week appears to be smaller. There is no surrounding tenderness. The area on the medial leg also did not seem to be as healthy as last week, the reason for this regression this week is not totally clear. We have been using Sorbact for the last 4 weeks. We'll switched of polymen AG which we will order via home medical supply. If there is a problem with this would switch back to Iodoflex 01/15/18; drainage,odor. No change. Switched to polymen last week 01/22/17; still continuous drainage. Culture I did last week showed a few Proteus pansensitive. I did this culture because of drainage. Put him on Augmentin which she has been taking since Saturday however he is developed 4-5 liquid bowel movements. He is also on Coumadin. Beyond this wound is not changed at all, still nonviable necrotic surface material which I debrided reveals healthy granulation line 01/29/17; still copious amounts of drainage reported by her intake nurse. Wound measuring slightly smaller. Currently fact on Iodoflex although I'm looking forward to changing back to perhaps [facility] or polymen AG 02/05/17; still large amounts of drainage and presenting with really large amounts of adherent slough and necrotic material over the remaining open area of the wound. We have been using Iodoflex but with little improvement in the surface. Change to [facility] 02/12/17; still large amount of drainage. Much less adherent slough however. Started [employer] last week CARLEE, TESFAYE  ([id number]) 02/19/17; drainage is better this week. Much less adherent slough. Perhaps some improvement in dimensions. Using [facility] 02/26/17; severe venous insufficiency wounds on the right lateral and right medial leg. Drainage is some better and slough is less adherent we've been using Hydrofera Blue 03/05/17 on evaluation today patient appears to be doing well. His wounds have been decreasing in size and overall he is pleased with how this is progressing. We are awaiting approval for the epigraph which has previously been recommended in the meantime the [facility] Dressing is to be doing very well for him. 03/12/17; wound dimensions are smaller still using Hydrofera Blue. Comes in on Fridays for a dressing change. 03/19/17; wound dimensions continued to contract. Healing of this wound is complicated by continuous significant drainage as well as recurrent buildup of necrotic surface material. We looked into Apligraf and he has a $290 co-pay per application but truthfully I think the drainage as well as the nonviable surface would preclude use of Apligraf there are any other skin substitute at this point therefore the continued plan will be debridement each clinic visit, 2 times a week dressing changes and continued use of Hydrofera Blue. improvement has been very slow but sustained 03/26/17 perhaps slight improvements in peripheral epithelialization is especially inferiorly. Still with large amount of drainage and tightly adherent necrotic surface on arrival. Along with the intake nurse I reviewed previous treatment. He worsened on Iodoflex and had a 4 week trial of sorbact, Polymen AG and a long courses of Aquacel Ag. He is not a candidate for advanced treatment options for many reasons 04/09/17 on evaluation today patient's right lower extremity wounds appear to be doing a little better. Fortunately he has no significant discomfort and has been tolerating the dressing changes including the  wraps without complication. With that being said he really does not have swelling anymore compared to what he has had in the past following his vascular intervention. He wonders if potentially we  could attempt avoiding the rats to see if he could cleanse the wound in between to try to prevent some of the fiber and its buildup that occurs in the interim between when we see him week to week. No fevers, chills, nausea, or vomiting noted at this time. 04/16/17; noted that the staff made a choice last week not to put him in compression. The patient is changing his dressing at home using [facility] and changing every second day. His wife is washing the wound with saline. He is using kerlix. Surprisingly he has not developed a lot of edema. This choice was made because of the degree of fluid retention and maceration even with changing his dressing twice a week 04/23/17; absolutely no change. Using Hydrofera Blue. Recurrent tightly adherent nonviable surface material. This is been refractory to Iodoflex, sorbact and now Hydrofera Blue. More recently he has been changing his own dressings at home, cleansing the wound in the shower. He has not developed lower extremity edema 04/30/17; if anything the wound is larger still in adherent surface although it debrided easily today. I've been using Mehta honey for 1 week and I'd like to try and do it for a second week this see if we can get some form of viable surface here. 05/07/17; patient arrives with copious amounts of drainage and some pain in the superior part of the wound. He has not been systemically unwell. He's been changing this daily at home 05/14/17; patient arrives today complaining of less drainage and less pain. Dimensions slightly better. Surface culture I did of this last week grew MSSA I put him on dicloxacillin for 10 days. Patient requesting a further prescription since he feels so much better. He did not obtain the [facility] AG for reasons that  aren't clear, he has been using Hydrofera Blue 05/21/17; perhaps some less drainage and less pain. He is completing another week of doxycycline. Unfortunately there is no change in the wound measurements are appearance still tightly adherent necrotic surface material that is really defied treatment. We have been using Hydrofera Blue most recently. He did not manage to get Anasept. He was told it was prescription at [facility] 05/29/17; absolutely no improvement in these wound areas. He had remote plastic surgery with who was done at [facility] in [place] surrounding this area. This was a traumatic wound with extensive plastic surgery/skin grafting at that time. I switched him to sorbact last week to see if he can do anything about the recurrent necrotic surface and drainage. This is largely been refractory to Iodoflex/Hydrofera Blue/alginates. I believe we tried Elby Showers and more recently Medi honey l however the drainage is really too excessive 06/04/17; patient went to see Dr. Ulice Bold. Per the patient she ordered him a compression stocking. Continued the Anasept gel and sorbact was already on. No major improvement in the condition of the wounds in fact the medial wound looks larger. Again per the patient she did not feel a skin graft or operative debridement was indicated The patient had previous venous ablation in February 2018. Last arterial studies were in December 2017 and were within normal limits 06/18/17; patient arrives back in clinic today using Anasept gel with sorbact. He changes this every day is wearing his own compression stocking. The patient actually saw Dr. Leta Baptist of plastic surgery. I've reviewed her note. The appointment was on 05/30/17. The basic issue with here was that she did not feel that skin grafts for patients with underlying stasis and edema have a good  long-term success rate. She also discuss skin substitutes which has been done here as well however I have not  been able to get the surface of these wounds to something that I think would support skin substitutes. This is why he felt he might need an TOSHIYUKI, FREDELL. ([id number]) operative debridement more aggressively than I can do in this clinic Review of systems; is otherwise negative he feels well 07/02/17; patient has been using Anasept gel with Sorbact. He changes this every day scrubs out the wound beds. Arrives today with the wound bed looking some better. Easier debridement 07/16/17; patient has been using Anasept gel was sorbact for about a month now. The necrotic service on his wound bed is better and the drainage is now down to minimal but we've not made any improvements in the epithelialization. Change to Santyl today. Surface of the wound is still not good enough to support skin substitutes 07/30/17 on evaluation today patient appears to be doing very well in regard to his right bimalleolus wounds. He has been tolerating the treatment with the Anasept gel and sorbact. However we were going to try and switch to Santyl to be more aggressive with these wounds. This was however cost prohibitive. Therefore we will likely go back to the sorbact. Nonetheless he is not having any significant discomfort he still is forming slough over the wound location but nothing too significant. The wounds do appear to be filling in nicely. 08/13/17; I have not seen this wound in about a month. There is not much change. The fact the area medially looks larger. He has been using sorbact and Anasept for over a month now without much effect. Arrives in clinic stating that he does not want the area debrided 08/28/17; patient arrives with the areas on his right medial and right lateral ankle worse. The wounds of expanded there is erythema and still drainage. There is no pain and no tenderness. We had been using Keracel AG clearly not doing well. The patient has had previous ablations I'm going to send him back to  vascular surgery to see if anything else can be done both wound areas are larger 09/10/17 on evaluation today patient appears to be doing a little bit worse in regard to his medial and lateral malleolus ulcer's of the right lower extremity. He states that even the evening that we began utilizing the Iodoflex he started having burning. Subsequently this never really improved and he eventually discontinue the use of the Iodoflex altogether. Unfortunately he did not let us know about any of this until today. I'm unsure of any specific infection at this point although I am going to obtain a wound culture today in order to see if there's anything that could potentially be causing an infection as well. It does sound as if he's had the Iodoflex before and did not have this kind of reaction although this does sound very specific for a reaction to the Iodoflex. 09/17/17 on evaluation today patient appears to be doing significantly better compared to last week's evaluation. At that time it actually appears that he did have an infection the good news is that we have been able to get this under control with switching to the [facility] solution he's not having as much pain and discomfort he still does have some erythema surrounding the medial aspect wound. He has been tolerating the Dakin's soaked gauze dressing which is excellent and that his wounds do not seem to have as much adherent slough. Overall I'm  pleased with the progress she's made in last week. 11/05/17 on evaluation today patient appears to be doing better in regard to his right medial and lateral malleolus ulcers. He has been tolerating the dressing changes without complication. I do flight the Freada Bergeron is helping with additional granulation at this point in the wound beds do appear to be a little bit better. With that being said patient does not appear to have any evidence of significant infection at this point there is no erythema as he has previously  noted he does note that the 30 day course of antibiotics I placed him on will end tomorrow. 11/12/17 on evaluation today patient actually appears to be doing excellent in regard to his right medial and lateral malleolus ulcers. He has been tolerating the dressing changes without complication. Fortunately he has no evidence of infection at this time and overall I believe he is progressing extremely nicely he has a lot of new epithelialization noted on evaluation today. Patient likewise is very pleased with the progress even just since last week. 11/19/17 on evaluation today patient appears to be doing about the same in regard to his ulcerations. He does not have any additional breakdown although he really has not noted any significant improvement either over the past week. With that being said the more concerning thing at this time is that he is beginning to show signs of erythema surrounding both wounds which is what typically happens and then he will develop into a more overt infection. This has been the course for some time. I hope that doing the 30 day in a biotic cycle this past time would break that so that he would not have the same type of thing happen again. Nonetheless it appears that is digressing back into this infected state unfortunately. With that being said he is not having any pain currently which is good he typically does start to hurt more as this progresses. Fortunately there does not appear to be any evidence of infection systemically which is good news. 11/26/17 on evaluation today patient appears to actually be doing rather well in regard to his ulcer compared to last week. He still has your theme and noted around both the medial and lateral malleolus ulcers of the right lower extremity. He does not seem to be quite as overtly infected however. He has been tolerating the dressing changes without complication which is good news. The gentamicin cream does seem to have been of benefit  for him. With that being said he does actually have an appointment with Dr. Sampson Goon this morning to see if there's anything that would be recommended in the way of IV antibiotic Malerba, Kristopher C. ([id number]) therapy. Otherwise he still has slough noted on the surface of the wound. 12/03/17 on evaluation today patient presents for follow-up concerning his ongoing right lower extremity ulcers. He has been tolerating the dressing changes without complication he states he has not really been using gentamicin on the lateral ankle and this seems to be doing very well. For that reason I think that is probably fine to put a hold on the gentamicin he states his burns and stings when he applies it and we maybe be able to just use this intermittently when things become more irritated. Other than that the good news is I did review his MRI which was performed on 11/28/17 and revealed that he has a negative MRI for abscess, osteomyelitis, or septic joint. Obviously these were the issues that I was concerned with  the possibility of and I'm pleased to find that there is no problems in that regard. I did also review Dr. Jarrett AblesFitzgerald's note from infectious disease he discussed performed some lab work which apparently was done and then subsequently was going to consider the possibility of Keflex long-term for prevention of infection although this has not been prescribed as of yet. 12/09/17 on evaluation today patient appears to be doing a little bit worse in regard to his right medial and lateral malleolus ulcers. Fortunately the wound does not seem to be significantly worse although he does have a lot more drainage especially the lateral aspect he is having a lot more pain than what he has noted more recently. Fortunately there does not appear to be any evidence of systemic infection which is good news. Again he has seen Dr. Sampson GoonFitzgerald but he has not been placed on the potential Keflex which was recommended as a  possibility at least yet. 12/17/17 unfortunately on evaluation today the patient's wound appears to be doing much worse. He has been performing the dressing changes as directed. Upon a review of notes in epic it does appear that Dr. Jarrett AblesFitzgerald's office specifically sending Keflex for the patient on the 16th which was last Tuesday. With that being said the patient states that though this was sent into [facility] that [facility] stated they never received it and subsequently the patient never took anything. He tells me he has been in bed since Friday due to the increased pain and overall general malaise. No fevers, chills, nausea, or vomiting noted at this time. 02/25/18 on evaluation today patient appears to be doing rather well in regard to his bilateral right malleolus ulcers. He has been tolerating the dressing changes without complication. Currently there does not appear to be any evidence of infection. Overall I'm very pleased with the progress that seems to been made up to this point. 03/11/18 on evaluation today patient actually appears to be doing very well in regard to his right ankle ulcers. He's been tolerating the dressing changes without complication. Fortunately there does not appear to be any evidence of sniffing infection I do believe that the gentamicin cream continues to be of benefit for him. Fortunately he is showing signs of healing in which time I see him. He also tells me he's having no pain. 03/25/18 on evaluation today patient actually appears to show signs of improvement especially in regard to the right lateral ankle ulcer. He has been tolerating the dressing changes without complication. In general I'm very pleased with the progress that has been made up to this point. 04/08/18- He presents in follow up evaluation for right bimalleolar wounds; there is essentially no change. He admits to removing the dressing at night to allow the wounds to be "air out". We will continue with same  treatment plan and he will follow up in two weeks 04/22/18 on evaluation today patient seems very frustrated with this situation currently as far as the ulcer on his right medial and lateral malleolus is concerned. He states this has been going on a very long time and obviously he does have a lot of bills as well is far as wound care is concerned. With that being said he tells me that he's been tolerating the dressing changes well although he did clarify that what he is doing is putting the medicine on in the morning after shower and then subsequently he takes the dressing off at night leaving it open to air. I previously asked him  about this and I was not aware that he was doing this. Nonetheless that may be slowing things down a bit at this point. Fortunately there does not appear to be any evidence of severe infection although he does have some evidence of your theme is surrounding the wound bed. He is still using the gentamicin cream. 05/06/18 on evaluation today patient actually presents for evaluation of his right medial malleolus ulcers. He's been tolerating the dressing changes without complication. With that being said it does appear that he is doing better compared to last evaluation. He's not having the pain like he did previous. He also states that blisters that he had coming up when I saw him last did resolve and ended up doing okay he did go to the ER they gave him some cream to put on it he's not sure what the name of the cream was. He was down in [facility] when he called me and I spoke with him previous. Nonetheless he states that they told him to put the medicine on his our due list. With that being said I'm not really 100% sure that the medicine had anything to do with this due to the fact that again he was having the blisters when he came into the office which is why we actually put them on the anabiotic I was concerned about him having an infection which was causing the  blistering and increased pain in his extremity. Nonetheless this is something that we can definitely be cautious of in the future it was Bactrim Guirguis, Devell C. ([id number]) that we had him on. Overall I'm glad he is doing better. 05/20/18 on evaluation today patient actually appears to be doing fairly well in regard to his ulcer on the right lateral and medial malleolus locations. Both seem to be doing decently well he does have slough building up on the surface of the wound. He has been using the Hibiclense as I instructed him. Overall there's no evidence of significant infection which is good news. 06/02/18 on evaluation today patient actually appears to be doing excellent in regard to his right medial and lateral malleolus ulcers. He has been tolerating the dressing changes without complication. There does not appear to be any evidence of infection at this time which is great news. In general I feel like he has made great progress in the past several weeks. He has good granulation noted over areas that have not had good granulation previously and epithelialization even more so in several areas. Both wounds measured smaller. 06/17/18 an evaluation today patient actually appears to be doing excellent in regard to his right lateral and medial malleolus ulcers. He has been tolerating the dressing changes without complication fortunately the Hydrofera Blue Dressing to be doing excellent for him. He's actually making progress each time I see him. 07/08/18 on evaluation today patient's wounds on the right medial and lateral malleolus or region show signs of improvement currently. He has been tolerating the dressing changes without complication at this time. He does feel like the [facility] Dressing has been of benefit for him. No fevers, chills, nausea, or vomiting noted at this time. 07/22/18 and evaluation today patient actually appears to be doing excellent in regard to his right medial and  lateral malleolus ulcers. He's been tolerating the dressing changes without complication the Hydrofera Blue Dressing years be excellent for him. Overall I'm extremely pleased in this regard. 08/05/18 on evaluation today patient's right lateral and medial malleolus ulcers appear to  be doing well at this point. There does not appear to be any evidence of infection which is good news. Overall he does have some Slough noted which is going to require sharp debridement but other than this I see no current issues 08/19/18 on evaluation today patient actually appears to be doing better yet again in regard to his lower [place] ulcer on the right medial and lateral malleolus regions. He is having no pain and no significant signs of infection which is great news. Overall things seem to be progressing quite nicely which is great news. No fevers, chills, nausea, or vomiting noted at this time. 09/09/18 on evaluation today patient appears to be doing very well in regard to his medial and lateral ankle ulcers. He's been tolerating the dressing changes without complication. Fortunately there is no evidence of infection at this time which is good news. No fevers, chills, nausea, or vomiting noted at this time. 09/23/18 on evaluation today patient appears to be doing rather well in regard to his right medial and lateral ankle ulcers. He is been tolerating the dressing changes and still things continue to do excellent. Overall very pleased with the progress and there's no signs of infection today. 10/07/18 on evaluation today patient appears to be doing very well in regard to his right medial and lateral ankle ulcers. He's been tolerating the dressing changes without complication. Fortunately there's no signs of infection at this time. Overall the patient states that he is very pleased with how things seem to be progressing. 10/21/18 on evaluation today patient actually appears to be doing very well in regard to his  right medial and lateral malleolus ulcers. He's been tolerating the dressing changes without complication overall I'm very pleased with how things seem to be progressing. There's no signs of infection. 11/04/18 on evaluation today patient actually appears to be feeling very well at this point. The medial wound bed actually show signs of improvement and looks to be doing very well. The lateral wing then actually is not want to touch with this show signs of increased drainage for some more Slough buildup. He's also having more pain which he states started last night. Nonetheless I'm concerned in this regard about the possibility of infection. No fevers, chills, nausea, or vomiting noted at this time. He has been since August 2019 since he last was pretty for infection. Patient History Information obtained from Patient. Social History BEDFORD, WINSOR ([id number]) Former smoker - 10 years ago, Marital Status - Married, Alcohol Use - Moderate, Drug Use - No History, Caffeine Use - Never. Medical History Eyes Patient has history of Cataracts - removed Denies history of Glaucoma, Optic Neuritis Ear/Nose/Mouth/Throat Denies history of Chronic sinus problems/congestion Hematologic/Lymphatic Denies history of Anemia, Hemophilia, Human Immunodeficiency Virus, Lymphedema, Sickle Cell Disease Respiratory Patient has history of Chronic Obstructive Pulmonary Disease (COPD) Denies history of Aspiration, Asthma, Pneumothorax, Sleep Apnea, Tuberculosis Cardiovascular Denies history of Angina, Arrhythmia, Congestive Heart Failure, Coronary Artery Disease, Deep Vein Thrombosis, Hypertension, Hypotension, Myocardial Infarction, Peripheral Arterial Disease, Peripheral Venous Disease, Phlebitis, Vasculitis Gastrointestinal Denies history of Cirrhosis , Colitis, Crohn s, Hepatitis A, Hepatitis B, Hepatitis C Endocrine Denies history of Type I Diabetes, Type II Diabetes Genitourinary Denies history of  End Stage Renal Disease Immunological Denies history of Lupus Erythematosus, Raynaud s Integumentary (Skin) Denies history of History of Burn, History of pressure wounds Musculoskeletal Patient has history of Osteoarthritis Denies history of Gout, Rheumatoid Arthritis, Osteomyelitis Neurologic Denies history of Dementia, Neuropathy, Quadriplegia, Paraplegia, Seizure  Disorder Oncologic Denies history of Received Chemotherapy, Received Radiation Psychiatric Denies history of Anorexia/bulimia, Confinement Anxiety Medical And Surgical History Notes Constitutional Symptoms (General Health) Vein Filter (groin area); CODA; H/O Blood Clots; pulmonary hypertensive arterial disease Review of Systems (ROS) Constitutional Symptoms (General Health) Denies complaints or symptoms of Fever, Chills. Respiratory The patient has no complaints or symptoms. Cardiovascular The patient has no complaints or symptoms. Psychiatric The patient has no complaints or symptoms. JORAN, KALLAL ([id number]) Objective Constitutional Well-nourished and well-hydrated in no acute distress. Vitals Time Taken: 8:08 AM, Height: 76 in, Weight: 238 lbs, BMI: 29, Temperature: 98.0 F, Pulse: 73 bpm, Respiratory Rate: 16 breaths/min, Blood Pressure: 133/70 mmHg. Respiratory normal breathing without difficulty. Psychiatric this patient is able to make decisions and demonstrates good insight into disease process. Alert and Oriented x 3. pleasant and cooperative. General Notes: Patient's wound bed currently shows evidence of good granulation at this time. There was some Slough buildup on the lateral wound which I very gently debrided away since he was having more discomfort. Post debridement I noticed that there was more drainage coming from the inferior portion of the wound which again is different compared is normal. For that reason I did go ahead and culture some fluid from this area we will see what this  shows. Integumentary (Hair, Skin) Wound #1 status is Open. Original cause of wound was Gradually Appeared. The wound is located on the Right,Lateral Malleolus. The wound measures 6.5cm length x 2.7cm width x 0.3cm depth; 13.784cm^2 area and 4.135cm^3 volume. There is Fat Layer (Subcutaneous Tissue) Exposed exposed. There is no tunneling or undermining noted. There is a medium amount of serous drainage noted. The wound margin is flat and intact. There is small (1-33%) pink granulation within the wound bed. There is a large (67-100%) amount of necrotic tissue within the wound bed including Adherent Slough. The periwound skin appearance exhibited: Scarring, Hemosiderin Staining. The periwound skin appearance did not exhibit: Callus, Crepitus, Excoriation, Induration, Rash, Dry/Scaly, Maceration, Atrophie Blanche, Cyanosis, Ecchymosis, Mottled, Pallor, Rubor, Erythema. Periwound temperature was noted as No Abnormality. Wound #2 status is Open. Original cause of wound was Gradually Appeared. The wound is located on the Right,Medial Malleolus. The wound measures 1.9cm length x 1.5cm width x 0.2cm depth; 2.238cm^2 area and 0.448cm^3 volume. There is Fat Layer (Subcutaneous Tissue) Exposed exposed. There is no tunneling or undermining noted. There is a medium amount of serous drainage noted. The wound margin is flat and intact. There is small (1-33%) red granulation within the wound bed. There is a large (67-100%) amount of necrotic tissue within the wound bed including Adherent Slough. The periwound skin appearance exhibited: Scarring. The periwound skin appearance did not exhibit: Callus, Crepitus, Excoriation, Induration, Rash, Dry/Scaly, Maceration, Atrophie Blanche, Cyanosis, Ecchymosis, Hemosiderin Staining, Mottled, Pallor, Rubor, Erythema. Periwound temperature was noted as No Abnormality. 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 ([id number]) Procedures Wound #1 Pre-procedure diagnosis of Wound #1 is a Venous Leg Ulcer located on the Right,Lateral Malleolus .Severity of Tissue Pre Debridement is: Fat layer exposed. There was a Excisional Skin/Subcutaneous Tissue Debridement with a total area of 17.55 sq cm performed by STONE III, HOYT E., PA-C. With the following instrument(s): Curette to remove Viable and Non-Viable tissue/material. Material removed  includes Subcutaneous Tissue, Slough, and Biofilm after achieving pain control using Lidocaine 4% Topical Solution. 1 specimen was taken by a Swab and sent to the lab per facility protocol. A time out was conducted at 08:32, prior to the start of the procedure. A Minimum amount of bleeding was controlled with Pressure. The procedure was tolerated well with a pain level of 0 throughout and a pain level of 0 following the procedure. Post Debridement Measurements: 6.5cm length x 2.7cm width x 0.4cm depth; 5.513cm^3 volume. Character of Wound/Ulcer Post Debridement is improved. Severity of Tissue Post Debridement is: Fat layer exposed. Post procedure Diagnosis Wound #1: Same as Pre-Procedure Plan Wound Cleansing: Wound #1 Right,Lateral Malleolus: Clean wound with Normal Saline. May Shower, gently pat wound dry prior to applying new dressing. - wash with hibiclens daily and leave on 5 mins Wound #2 Right,Medial Malleolus: Clean wound with Normal Saline. May Shower, gently pat wound dry prior to applying new dressing. - wash with hibiclens and leave on 5 mins Anesthetic (add to Medication List): Wound #1 Right,Lateral Malleolus: Topical Lidocaine 4% cream applied to wound bed prior to debridement (In  Clinic Only). Wound #2 Right,Medial Malleolus: Topical Lidocaine 4% cream applied to wound bed prior to debridement (In Clinic Only). Primary Wound Dressing: Wound #1 Right,Lateral Malleolus: Hydrafera Blue Ready Transfer Other: - Gentamicin cream to wound bed Wound #2 Right,Medial Malleolus: Hydrafera Blue Ready Transfer Other: - Gentamicin cream to wound bed Secondary Dressing: Wound #1 Right,Lateral Malleolus: Dry Gauze Conform/Kerlix Wound #2 Right,Medial Malleolus: Dry Gauze Conform/Kerlix Dressing Change Frequency: Wound #1 Right,Lateral Malleolus: Change dressing every other day. Wound #2 Right,Medial Malleolus: Change dressing every other day. Follow-up Appointments: Wound #1 Right,Lateral Malleolus: Return Appointment in 2 weeks. Wound #2 Right,Medial Malleolus: Return Appointment in 2 weeks. AVRAM, DANIELSON ([id number]) Edema Control: Wound #1 Right,Lateral Malleolus: Elevate legs to the level of the heart and pump ankles as often as possible Wound #2 Right,Medial Malleolus: Elevate legs to the level of the heart and pump ankles as often as possible Additional Orders / Instructions: Wound #1 Right,Lateral Malleolus: Increase protein intake. Wound #2 Right,Medial Malleolus: Increase protein intake. Medications-please add to medication list.: Wound #1 Right,Lateral Malleolus: P.O. Antibiotics Laboratory ordered were: Wound culture routine The following medication(s) was prescribed: Bactrim DS oral 800 mg-160 mg tablet 1 1 tablet oral taken 2 times a day for 14 days starting 11/04/2018 I'm in a recommend at this point that we go ahead and continue with the above wound care measures for the next week. He is in agreement the plan. I did send in a prescription for Bactrim which is had before and done very well with. I'm in a recommend that we go ahead and continue with the otherwise Current wound care measures since he's done well. Please see above for specific  wound care orders. We will see patient for re-evaluation in 2 week(s) here in the clinic. If anything worsens or changes patient will contact our office for additional recommendations. Electronic Signature(s) Signed: 11/05/2018 1:14:32 PM By: Lenda Kelp PA-C Entered By: Lenda Kelp on 11/04/2018 08:43:58 [name] ([id number]) -------------------------------------------------------------------------------- ROS/PFSH Details Patient Name: [name] Date of Service: 11/04/2018 8:00 AM Medical Record Number: [id number] Patient Account Number: [id number] Date of Birth/Sex: [birth year] (73 y.o. M) Treating RN: Curtis Sites Primary Care Provider: [name] Other Clinician: Referring Provider: [name] Treating Provider/Extender: STONE III, HOYT Weeks in Treatment: 146 Information Obtained From Patient Wound History Do you currently have one or  more open woundso Yes How many open wounds do you currently haveo 1 Approximately how long have you had your woundso 5 months How have you been treating your wound(s) until nowo saline, dressing Has your wound(s) ever healed and then re-openedo Yes Have you had any lab work done in the past montho No Have you tested positive for an antibiotic resistant organism (MRSA, VRE)o No Have you tested positive for osteomyelitis (bone infection)o No Have you had any tests for circulation on your legso No Constitutional Symptoms (General Health) Complaints and Symptoms: Negative for: Fever; Chills Medical History: Past Medical History Notes: Vein Filter (groin area); CODA; H/O Blood Clots; pulmonary hypertensive arterial disease Eyes Medical History: Positive for: Cataracts - removed Negative for: Glaucoma; Optic Neuritis Ear/Nose/Mouth/Throat Medical History: Negative for: Chronic sinus problems/congestion Hematologic/Lymphatic Medical History: Negative for: Anemia; Hemophilia; Human Immunodeficiency Virus;  Lymphedema; Sickle Cell Disease Respiratory Complaints and Symptoms: No Complaints or Symptoms Medical History: Positive for: Chronic Obstructive Pulmonary Disease (COPD) Negative for: Aspiration; Asthma; Pneumothorax; Sleep Apnea; Tuberculosis Cardiovascular ROGELIO, WAYNICK. ([id number]) Complaints and Symptoms: No Complaints or Symptoms Medical History: Negative for: Angina; Arrhythmia; Congestive Heart Failure; Coronary Artery Disease; Deep Vein Thrombosis; Hypertension; Hypotension; Myocardial Infarction; Peripheral Arterial Disease; Peripheral Venous Disease; Phlebitis; Vasculitis Gastrointestinal Medical History: Negative for: Cirrhosis ; Colitis; Crohnos; Hepatitis A; Hepatitis B; Hepatitis C Endocrine Medical History: Negative for: Type I Diabetes; Type II Diabetes Genitourinary Medical History: Negative for: End Stage Renal Disease Immunological Medical History: Negative for: Lupus Erythematosus; Raynaudos Integumentary (Skin) Medical History: Negative for: History of Burn; History of pressure wounds Musculoskeletal Medical History: Positive for: Osteoarthritis Negative for: Gout; Rheumatoid Arthritis; Osteomyelitis Neurologic Medical History: Negative for: Dementia; Neuropathy; Quadriplegia; Paraplegia; Seizure Disorder Oncologic Medical History: Negative for: Received Chemotherapy; Received Radiation Psychiatric Complaints and Symptoms: No Complaints or Symptoms Medical History: Negative for: Anorexia/bulimia; Confinement Anxiety HBO Extended History Items MARCELIS, WISSNER. ([id number]) Eyes: Cataracts Immunizations Pneumococcal Vaccine: Received Pneumococcal Vaccination: No Implantable Devices No devices added Family and Social History Former smoker - 10 years ago; Marital Status - Married; Alcohol Use: Moderate; Drug Use: No History; Caffeine Use: Never; Advanced Directives: No; Patient does not want information on Advanced Directives; Living  Will: No; Medical Power of Attorney: No Physician Affirmation I have reviewed and agree with the above information. Electronic Signature(s) Signed: 11/05/2018 1:14:32 PM By: Lenda Kelp PA-C Signed: 11/05/2018 4:23:17 PM By: Curtis Sites Entered By: Lenda Kelp on 11/04/2018 08:42:54 [name] ([id number]) -------------------------------------------------------------------------------- SuperBill Details Patient Name: [name] Date of Service: 11/04/2018 Medical Record Number: [id number] Patient Account Number: [id number] Date of Birth/Sex: Jan 30, 1946 (73 y.o. 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, sequela T81.31XS Disruption of external operation (surgical) wound, not elsewhere classified, sequela Facility Procedures CPT4 Code: 36644034 Description: 11042 - DEB SUBQ TISSUE 20 SQ CM/< ICD-10 Diagnosis Description L97.312 Non-pressure chronic ulcer of right ankle with fat layer ex Modifier: posed Quantity: 1 Physician Procedures CPT4 Code: [id number] Description: 11042 - WC PHYS SUBQ TISS 20 SQ CM ICD-10 Diagnosis Description 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

## 2018-11-07 LAB — AEROBIC CULTURE W GRAM STAIN (SUPERFICIAL SPECIMEN): Gram Stain: NONE SEEN

## 2018-11-07 LAB — AEROBIC CULTURE  (SUPERFICIAL SPECIMEN)

## 2018-11-18 ENCOUNTER — Ambulatory Visit: Payer: Medicare HMO | Admitting: Physician Assistant

## 2018-12-16 ENCOUNTER — Encounter: Payer: Medicare HMO | Attending: Physician Assistant | Admitting: Physician Assistant

## 2018-12-16 ENCOUNTER — Other Ambulatory Visit: Payer: Self-pay

## 2018-12-16 DIAGNOSIS — J449 Chronic obstructive pulmonary disease, unspecified: Secondary | ICD-10-CM | POA: Insufficient documentation

## 2018-12-16 DIAGNOSIS — I87311 Chronic venous hypertension (idiopathic) with ulcer of right lower extremity: Secondary | ICD-10-CM | POA: Diagnosis not present

## 2018-12-16 DIAGNOSIS — L97312 Non-pressure chronic ulcer of right ankle with fat layer exposed: Secondary | ICD-10-CM | POA: Diagnosis present

## 2018-12-16 DIAGNOSIS — M1990 Unspecified osteoarthritis, unspecified site: Secondary | ICD-10-CM | POA: Diagnosis not present

## 2018-12-19 NOTE — Progress Notes (Signed)
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 in the shower. He has not developed lower extremity edema 04/30/17; if anything the wound is larger still in adherent surface although it debrided easily today. I've been using Mehta honey for 1 week and I'd like to try and do it for a second week this see if we can get some form of viable surface here. 05/07/17; patient arrives with copious amounts of drainage and some pain in the superior part of the wound. He has not been systemically unwell. He's been changing this daily at home 05/14/17; patient arrives today complaining of less drainage and less pain. Dimensions slightly better. Surface culture I did of this last week grew MSSA I put him on dicloxacillin for 10 days. Patient requesting a further prescription since he feels so much better. He did not obtain the [facility] AG for reasons that aren't clear, he has been using Hydrofera Blue 05/21/17; perhaps some less drainage and less pain. He is completing another week of doxycycline. Unfortunately there is no change in the wound measurements are appearance still tightly adherent necrotic surface material that is really defied treatment. We have been using Hydrofera Blue most recently. He did not manage to get Anasept. He was told it  was prescription at [facility] 05/29/17; absolutely no improvement in these wound areas. He had remote plastic surgery with who was done at [facility] in [place] surrounding this area. This was a traumatic wound with extensive plastic surgery/skin grafting at that time. I switched him to sorbact last week to see if he can do anything about the recurrent necrotic surface and drainage. This is largely been refractory to Iodoflex/Hydrofera Blue/alginates. I believe we tried Elby Showers and more recently Medi honey l however the drainage is really too excessive 06/04/17; patient went to see Dr. Ulice Bold. Per the patient she ordered him a compression stocking. Continued the Anasept gel and sorbact was already on. No major improvement in the condition of the wounds in fact the medial wound looks larger. Again per the patient she did not feel a skin graft or operative debridement was indicated The patient had previous venous ablation in February 2018. Last arterial studies were in December 2017 and were within normal limits 06/18/17;  patient arrives back in clinic today using Anasept gel with sorbact. He changes this every day is wearing his own compression stocking. The patient actually saw Dr. Leta Baptist of plastic surgery. I've reviewed her note. The appointment was on 05/30/17. The basic issue with here was that she did not feel that skin grafts for patients with underlying stasis and edema have a good long-term success rate. She also discuss skin substitutes which has been done here as well however I have not been able to get the surface of these wounds to something that I think would support skin substitutes. This is why he felt he might need an operative debridement more aggressively than I can do in this clinic Review of systems; is otherwise negative he feels well 07/02/17; patient has been using Anasept gel with Sorbact. He changes this every day scrubs out the wound beds. Arrives today with the  wound bed looking some better. Easier debridement 07/16/17; patient has been using Anasept gel was sorbact for about a month now. The necrotic service on his wound bed is better and the drainage is now down to minimal but we've not made any improvements in the epithelialization. Change to DAQUAWN, SEELMAN ([id number]) Santyl today. Surface of the wound is still not good enough to support skin substitutes 07/30/17 on evaluation today patient appears to be doing very well in regard to his right bimalleolus wounds. He has been tolerating the treatment with the Anasept gel and sorbact. However we were going to try and switch to Santyl to be more aggressive with these wounds. This was however cost prohibitive. Therefore we will likely go back to the sorbact. Nonetheless he is not having any significant discomfort he still is forming slough over the wound location but nothing too significant. The wounds do appear to be filling in nicely. 08/13/17; I have not seen this wound in about a month. There is not much change. The fact the area medially looks larger. He has been using sorbact and Anasept for over a month now without much effect. Arrives in clinic stating that he does not want the area debrided 08/28/17; patient arrives with the areas on his right medial and right lateral ankle worse. The wounds of expanded there is erythema and still drainage. There is no pain and no tenderness. We had been using Keracel AG clearly not doing well. 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 He states that even the evening that we began utilizing the Iodoflex he started having burning. Subsequently this never really improved and he eventually discontinue  the use of the Iodoflex altogether. Unfortunately he did not let us know about any of this until today. I'm unsure of any specific infection at this point although I am going to obtain a wound culture today in order to see if there's anything that could potentially be causing an infection as well. It does sound as if he's had the Iodoflex before and did not have this kind of reaction although this does sound very specific for a reaction to the Iodoflex. 09/17/17 on evaluation today patient appears to be doing significantly better compared to last week's evaluation. At that time it actually appears that he did have an infection the good  news is that we have been able to get this under control with switching to the [facility] solution he's not having as much pain and discomfort he still does have some erythema surrounding the medial aspect wound. He has been tolerating the Dakin's soaked gauze dressing which is excellent and that his wounds do not seem to have as much adherent slough. Overall I'm pleased with the progress she's made in last week. 11/05/17 on evaluation today patient appears to be doing better in regard to his right medial and lateral malleolus ulcers. He has been tolerating the dressing changes without complication. I do flight the Freada Bergeron is helping with additional granulation at this point in the wound beds do appear to be a little bit better. With that being said patient does not appear to have any evidence of significant infection at this point there is no erythema as he has previously noted he does note that the 30 day course of antibiotics I placed him on will end tomorrow. 11/12/17 on evaluation today patient actually appears to be doing excellent in regard to his right medial and lateral malleolus ulcers. He has been tolerating the dressing changes without complication. Fortunately he has no evidence of infection at this time and overall I believe he is progressing extremely nicely he  has a lot of new epithelialization noted on evaluation today. Patient likewise is very pleased with the progress even just since last week. 11/19/17 on evaluation today patient appears to be doing about the same in regard to his ulcerations. He does not have any additional breakdown although he really has not noted any significant improvement either over the past week. With that being said the more concerning thing at this time is that he is beginning to show signs of erythema surrounding both wounds which is what typically happens and then he will develop into a more overt infection. This has been the course for some time. I hope that doing the 30 day in a biotic cycle this past time would break that so that he would not have the same type of thing happen again. Nonetheless it appears that is digressing back into this infected state unfortunately. With that being said he is not having any pain currently which is good he typically does start to hurt more as this progresses. Fortunately there does not appear to be any evidence of infection systemically which is good news. 11/26/17 on evaluation today patient appears to actually be doing rather well in regard to his ulcer compared to last week. He still has your theme and noted around both the medial and lateral malleolus ulcers of the right lower extremity. He does not seem to be quite as overtly infected however. He has been tolerating the dressing changes without complication which is good news. The gentamicin cream does seem to have been of benefit for him. With that being said he does actually have an appointment with Dr. Sampson Goon this morning to see if there's anything that would be recommended in the way of IV antibiotic therapy. Otherwise he still has slough noted on the surface of the wound. 12/03/17 on evaluation today patient presents for follow-up concerning his ongoing right lower extremity ulcers. He has been tolerating the dressing changes  without complication he states he has not really been using gentamicin on the lateral ankle and this seems to be doing very well. For that reason I think that is probably fine to put a hold on the gentamicin he states his burns and stings  when he applies it and we maybe be able to just use this intermittently when things become more irritated. LARRI, YEHLE ([id number]) Other than that the good news is I did review his MRI which was performed on 11/28/17 and revealed that he has a negative MRI for abscess, osteomyelitis, or septic joint. Obviously these were the issues that I was concerned with the possibility of and I'm pleased to find that there is no problems in that regard. I did also review Dr. Jarrett Ables note from infectious disease he discussed performed some lab work which apparently was done and then subsequently was going to consider the possibility of Keflex long-term for prevention of infection although this has not been prescribed as of yet. 12/09/17 on evaluation today patient appears to be doing a little bit worse in regard to his right medial and lateral malleolus ulcers. Fortunately the wound does not seem to be significantly worse although he does have a lot more drainage especially the lateral aspect he is having a lot more pain than what he has noted more recently. Fortunately there does not appear to be any evidence of systemic infection which is good news. Again he has seen Dr. Sampson Goon but he has not been placed on the potential Keflex which was recommended as a possibility at least yet. 12/17/17 unfortunately on evaluation today the patient's wound appears to be doing much worse. He has been performing the dressing changes as directed. Upon a review of notes in epic it does appear that Dr. Jarrett Ables office specifically sending Keflex for the patient on the 16th which was last Tuesday. With that being said the patient states that though this was sent into [facility] that  [facility] stated they never received it and subsequently the patient never took anything. He tells me he has been in bed since Friday due to the increased pain and overall general malaise. No fevers, chills, nausea, or vomiting noted at this time. 02/25/18 on evaluation today patient appears to be doing rather well in regard to his bilateral right malleolus ulcers. He has been tolerating the dressing changes without complication. Currently there does not appear to be any evidence of infection. Overall I'm very pleased with the progress that seems to been made up to this point. 03/11/18 on evaluation today patient actually appears to be doing very well in regard to his right ankle ulcers. He's been tolerating the dressing changes without complication. Fortunately there does not appear to be any evidence of sniffing infection I do believe that the gentamicin cream continues to be of benefit for him. Fortunately he is showing signs of healing in which time I see him. He also tells me he's having no pain. 03/25/18 on evaluation today patient actually appears to show signs of improvement especially in regard to the right lateral ankle ulcer. He has been tolerating the dressing changes without complication. In general I'm very pleased with the progress that has been made up to this point. 04/08/18- He presents in follow up evaluation for right bimalleolar wounds; there is essentially no change. He admits to removing the dressing at night to allow the wounds to be "air out". We will continue with same treatment plan and he will follow up in two weeks 04/22/18 on evaluation today patient seems very frustrated with this situation currently as far as the ulcer on his right medial and lateral malleolus is concerned. He states this has been going on a very long time and obviously he does have a lot of  bills as well is far as wound care is concerned. With that being said he tells me that he's been tolerating the  dressing changes well although he did clarify that what he is doing is putting the medicine on in the morning after shower and then subsequently he takes the dressing off at night leaving it open to air. I previously asked him about this and I was not aware that he was doing this. Nonetheless that may be slowing things down a bit at this point. Fortunately there does not appear to be any evidence of severe infection although he does have some evidence of your theme is surrounding the wound bed. He is still using the gentamicin cream. 05/06/18 on evaluation today patient actually presents for evaluation of his right medial malleolus ulcers. He's been tolerating the dressing changes without complication. With that being said it does appear that he is doing better compared to last evaluation. He's not having the pain like he did previous. He also states that blisters that he had coming up when I saw him last did resolve and ended up doing okay he did go to the ER they gave him some cream to put on it he's not sure what the name of the cream was. He was down in [facility] when he called me and I spoke with him previous. Nonetheless he states that they told him to put the medicine on his our due list. With that being said I'm not really 100% sure that the medicine had anything to do with this due to the fact that again he was having the blisters when he came into the office which is why we actually put them on the anabiotic I was concerned about him having an infection which was causing the blistering and increased pain in his extremity. Nonetheless this is something that we can definitely be cautious of in the future it was Bactrim that we had him on. Overall I'm glad he is doing better. 05/20/18 on evaluation today patient actually appears to be doing fairly well in regard to his ulcer on the right lateral and medial malleolus locations. Both seem to be doing decently well he does have slough building  up on the surface of the wound. He has been using the Hibiclense as I instructed him. Overall there's no evidence of significant infection which is good news. SUEDE, GREENAWALT ([id number]) 06/02/18 on evaluation today patient actually appears to be doing excellent in regard to his right medial and lateral malleolus ulcers. He has been tolerating the dressing changes without complication. There does not appear to be any evidence of infection at this time which is great news. In general I feel like he has made great progress in the past several weeks. He has good granulation noted over areas that have not had good granulation previously and epithelialization even more so in several areas. Both wounds measured smaller. 06/17/18 an evaluation today patient actually appears to be doing excellent in regard to his right lateral and medial malleolus ulcers. He has been tolerating the dressing changes without complication fortunately the Hydrofera Blue Dressing to be doing excellent for him. He's actually making progress each time I see him. 07/08/18 on evaluation today patient's wounds on the right medial and lateral malleolus or region show signs of improvement currently. He has been tolerating the dressing changes without complication at this time. He does feel like the [facility] Dressing has been of benefit for him. No fevers, chills, nausea, or  vomiting noted at this time. 07/22/18 and evaluation today patient actually appears to be doing excellent in regard to his right medial and lateral malleolus ulcers. He's been tolerating the dressing changes without complication the Hydrofera Blue Dressing years be excellent for him. Overall I'm extremely pleased in this regard. 08/05/18 on evaluation today patient's right lateral and medial malleolus ulcers appear to be doing well at this point. There does not appear to be any evidence of infection which is good news. Overall he does have some Slough  noted which is going to require sharp debridement but other than this I see no current issues 08/19/18 on evaluation today patient actually appears to be doing better yet again in regard to his lower Trinity ulcer on the right medial and lateral malleolus regions. He is having no pain and no significant signs of infection which is great news. Overall things seem to be progressing quite nicely which is great news. No fevers, chills, nausea, or vomiting noted at this time. 09/09/18 on evaluation today patient appears to be doing very well in regard to his medial and lateral ankle ulcers. He's been tolerating the dressing changes without complication. Fortunately there is no evidence of infection at this time which is good news. No fevers, chills, nausea, or vomiting noted at this time. 09/23/18 on evaluation today patient appears to be doing rather well in regard to his right medial and lateral ankle ulcers. He is been tolerating the dressing changes and still things continue to do excellent. Overall very pleased with the progress and there's no signs of infection today. 10/07/18 on evaluation today patient appears to be doing very well in regard to his right medial and lateral ankle ulcers. He's been tolerating the dressing changes without complication. Fortunately there's no signs of infection at this time. Overall the patient states that he is very pleased with how things seem to be progressing. 10/21/18 on evaluation today patient actually appears to be doing very well in regard to his right medial and lateral malleolus ulcers. He's been tolerating the dressing changes without complication overall I'm very pleased with how things seem to be progressing. There's no signs of infection. 11/04/18 on evaluation today patient actually appears to be feeling very well at this point. The medial wound bed actually show signs of improvement and looks to be doing very well. The lateral wing then actually is not  want to touch with this show signs of increased drainage for some more Slough buildup. He's also having more pain which he states started last night. Nonetheless I'm concerned in this regard about the possibility of infection. No fevers, chills, nausea, or vomiting noted at this time. He has been since August 2019 since he last was pretty for infection. 12/16/18 on evaluation today patient appears to be doing very well in regard to his medial and lateral malleolus ulcers. It has been a little over a month since I've last seen him secondary to family issues that he's had as well as issues with the coronavirus pandemic. Nonetheless he has continued to follow the directions for dressing changes in the interim since I last saw and and appears to be doing excellent. In fact his medial ulcer seems significantly smaller the lateral is also smaller but again it was a larger wound to begin with. Overall I do feel like he's making good progress however. Electronic SignatureDominguezs) Signed: 12/19/2018 8:49:36 AM By: [name] Kelp [name] Entered By: [name] on 12/16/2018 08:49:24 [name])  ANGLE, KAREL ([id number]) -------------------------------------------------------------------------------- Physical Exam Details Patient Name: BERKLEY, CRONKRIGHT. Date of Service: 12/16/2018 8:15 AM Medical Record Number: [id number] Patient Account Number: [id number] Date of Birth/Sex: Oct 11, 1945 (73 y.o. M) Treating RN: [name] Primary Care Provider: [name] Other Clinician: Referring Provider: [name] Treating Provider/Extender: [name], [name] Weeks in Treatment: 152 Constitutional Well-nourished and well-hydrated in no acute distress. Respiratory normal breathing without difficulty. Psychiatric this patient is able to make decisions and demonstrates good insight into disease process. Alert and Oriented x 3. pleasant and cooperative. Notes Patient's wounds  currently did require some sharp debridement he has some dry exudate as well as Slough noted on the surface of the wound which I was able to carefully sharply debride today without complication post debridement he appears to be doing much better. Electronic SignatureDominguezs) Signed: 12/19/2018 8:49:36 AM By: [name] Kelp [name] Entered By: [name] on 12/16/2018 08:49:53 [name]) -------------------------------------------------------------------------------- Physician Orders Details Patient Name: [name] Date of Service: 12/16/2018 8:15 AM Medical Record Number: [id number] Patient Account Number: [id number] Date of Birth/Sex: 31-Oct-1945 (73 y.o. 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 o May Shower, gently pat wound dry prior to applying new dressing. - wash with hibiclens daily and leave on 5 mins Wound #2 Right,Medial Malleolus o Clean wound with Normal Saline. o May Shower, gently pat wound dry prior to applying new dressing. - wash with hibiclens and leave on 5 mins Anesthetic (add to Medication List) Wound #1 Right,Lateral Malleolus o Topical Lidocaine 4% cream applied to wound bed prior to debridement (In Clinic Only). Wound #2 Right,Medial Malleolus o Topical Lidocaine 4%  cream applied to wound bed prior to debridement (In Clinic Only). Primary Wound Dressing Wound #1 Right,Lateral Malleolus o Hydrafera Blue Ready Transfer o Other: - Gentamicin cream to wound bed Wound #2 Right,Medial Malleolus o Hydrafera Blue Ready Transfer o Other: - Gentamicin cream to wound bed Secondary Dressing Wound #1 Right,Lateral Malleolus o Dry Gauze o Conform/Kerlix Wound #2 Right,Medial Malleolus o Dry Gauze o Conform/Kerlix Dressing Change Frequency Caesar, Adonay C. ([id number]) Wound #1 Right,Lateral Malleolus o Change dressing every other day. Wound #2 Right,Medial Malleolus o Change dressing every other day. Follow-up Appointments Wound #1 Right,Lateral Malleolus o Return Appointment in 2 weeks. Wound #2 Right,Medial Malleolus o Return Appointment in 2 weeks. Edema Control Wound #1 Right,Lateral Malleolus o Elevate legs to the level of the heart and pump ankles as often as possible Wound #2 Right,Medial Malleolus o Elevate legs to the level of the heart and pump ankles as often as possible Additional Orders / Instructions Wound #1 Right,Lateral Malleolus o Increase protein intake. Wound #2 Right,Medial Malleolus o Increase protein intake. Medications-please add to medication list. Wound #1 Right,Lateral Malleolus o P.O. Antibiotics Patient Medications Allergies: iodine Notifications Medication Indication Start End Bactrim DS 12/16/2018 DOSE 1 - oral 800 mg-160 mg tablet - 1 tablet oral taken 2 times a day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 Review of systems; is otherwise negative he feels well 07/02/17; patient has been using Anasept gel with Sorbact. He changes this every day scrubs out the wound beds. Arrives today with the wound bed looking some better. Easier debridement 07/16/17; patient has been using Anasept gel was sorbact for about a month now. The necrotic service on his wound bed is better and the drainage is now down to minimal but we've not made any improvements in the epithelialization. Change to Santyl today. Surface of the wound is still not good enough to support skin substitutes 07/30/17 on evaluation today patient appears to be doing very well in regard to his right bimalleolus wounds. He has been tolerating the treatment with the Anasept gel and sorbact. However we were going to try and switch to Santyl to be more aggressive with these wounds. This was however cost prohibitive. Therefore we will likely go back to the sorbact. Nonetheless he is not having any significant discomfort he still is forming slough over the wound location but nothing too significant. The wounds do appear to be filling in nicely. 08/13/17; I have not seen this wound in about a month. There is not much change. The fact the area medially looks larger. He has been using sorbact and Anasept for over a month now without much effect. Arrives in clinic stating that he does not want the area debrided 08/28/17; patient arrives with the areas on his right medial and right lateral ankle worse. The wounds of expanded there  is erythema and still drainage. There is no pain and no tenderness. We had been  using Keracel AG clearly not doing well. 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 He states that even the evening that we began utilizing the Iodoflex he started having burning. Subsequently this never really improved and he eventually discontinue the use of the Iodoflex altogether. Unfortunately he did not let us know about any of this until today. I'm unsure of any specific infection at this point although I am going to obtain a wound culture today in order to see if there's anything that could potentially be causing an infection as well. It does sound as if he's had the Iodoflex before and did not have this kind of reaction although this does sound very specific for a reaction to the Iodoflex. 09/17/17 on evaluation today patient appears to be doing significantly better compared to last week's evaluation. At that time it actually appears that he did have an infection the good news is that we have been able to get this under control with switching to the [facility] solution he's not having as much pain and discomfort he still does have some erythema surrounding the medial aspect wound. He has been tolerating the Dakin's soaked gauze dressing which is excellent and that his wounds do not seem to have as much adherent slough. Overall I'm pleased with the progress she's made in last week. 11/05/17 on evaluation today patient appears to be doing better in regard to his right medial and lateral malleolus ulcers. He has been tolerating the dressing changes without complication. I do flight the Freada Bergeron is helping with additional granulation at this point in the wound beds do appear to be a little bit better. With that being said patient does not  appear to have any evidence of significant infection at this point there is no erythema as he has previously noted he does note that the 30 day course of antibiotics I placed him on will end tomorrow. 11/12/17 on evaluation today patient actually appears to be doing excellent in regard to his right medial and lateral malleolus ulcers. He has been tolerating the dressing changes without complication. Fortunately he has no evidence of infection at this time and overall I believe he is progressing extremely nicely he has a lot of new epithelialization noted on evaluation today. Patient likewise is very pleased with the progress even just since last week. 11/19/17 on evaluation today patient appears to be doing about the same in regard to his ulcerations. He does not have any additional breakdown although he really has not noted any significant improvement either over the past week. With that being said the more concerning thing at this time is that he is beginning to show signs of erythema surrounding both wounds which is what typically happens and then he will develop into a more overt infection. This has been the course for some time. I hope that doing the 30 day in a biotic cycle this past time would break that so that he would not have the same type of thing happen again. Nonetheless it appears that is digressing back into this infected state unfortunately. With that being said he is not having any pain currently  which is good he typically does start to hurt more as this progresses. Fortunately there does not appear to be any evidence of infection systemically which is good news. 11/26/17 on evaluation today patient appears to actually be doing rather well in regard to his ulcer compared to last week. He still has your theme and noted around both the medial and lateral malleolus ulcers of the right lower extremity. He does not seem to be quite as overtly infected however. He has been tolerating the  dressing changes without complication which is good news. The gentamicin cream does seem to have been of benefit for him. With that being said he does actually have an appointment with Dr. Sampson Goon this morning to see if there's anything that would be recommended in the way of IV antibiotic Crisostomo, Kacey C. ([id number]) therapy. Otherwise he still has slough noted on the surface of the wound. 12/03/17 on evaluation today patient presents for follow-up concerning his ongoing right lower extremity ulcers. He has been tolerating the dressing changes without complication he states he has not really been using gentamicin on the lateral ankle and this seems to be doing very well. For that reason I think that is probably fine to put a hold on the gentamicin he states his burns and stings when he applies it and we maybe be able to just use this intermittently when things become more irritated. Other than that the good news is I did review his MRI which was performed on 11/28/17 and revealed that he has a negative MRI for abscess, osteomyelitis, or septic joint. Obviously these were the issues that I was concerned with the possibility of and I'm pleased to find that there is no problems in that regard. I did also review Dr. Jarrett Ables note from infectious disease he discussed performed some lab work which apparently was done and then subsequently was going to consider the possibility of Keflex long-term for prevention of infection although this has not been prescribed as of yet. 12/09/17 on evaluation today patient appears to be doing a little bit worse in regard to his right medial and lateral malleolus ulcers. Fortunately the wound does not seem to be significantly worse although he does have a lot more drainage especially the lateral aspect he is having a lot more pain than what he has noted more recently. Fortunately there does not appear to be any evidence of systemic infection which is good news.  Again he has seen Dr. Sampson Goon but he has not been placed on the potential Keflex which was recommended as a possibility at least yet. 12/17/17 unfortunately on evaluation today the patient's wound appears to be doing much worse. He has been performing the dressing changes as directed. Upon a review of notes in epic it does appear that Dr. Jarrett Ables office specifically sending Keflex for the patient on the 16th which was last Tuesday. With that being said the patient states that though this was sent into [facility] that [facility] stated they never received it and subsequently the patient never took anything. He tells me he has been in bed since Friday due to the increased pain and overall general malaise. No fevers, chills, nausea, or vomiting noted at this time. 02/25/18 on evaluation today patient appears to be doing rather well in regard to his bilateral right malleolus ulcers. He has been tolerating the dressing changes without complication. Currently there does not appear to be any evidence of infection. Overall I'm very pleased with the progress that seems to been  made up to this point. 03/11/18 on evaluation today patient actually appears to be doing very well in regard to his right ankle ulcers. He's been tolerating the dressing changes without complication. Fortunately there does not appear to be any evidence of sniffing infection I do believe that the gentamicin cream continues to be of benefit for him. Fortunately he is showing signs of healing in which time I see him. He also tells me he's having no pain. 03/25/18 on evaluation today patient actually appears to show signs of improvement especially in regard to the right lateral ankle ulcer. He has been tolerating the dressing changes without complication. In general I'm very pleased with the progress that has been made up to this point. 04/08/18- He presents in follow up evaluation for right bimalleolar wounds; there is essentially no change.  He admits to removing the dressing at night to allow the wounds to be "air out". We will continue with same treatment plan and he will follow up in two weeks 04/22/18 on evaluation today patient seems very frustrated with this situation currently as far as the ulcer on his right medial and lateral malleolus is concerned. He states this has been going on a very long time and obviously he does have a lot of bills as well is far as wound care is concerned. With that being said he tells me that he's been tolerating the dressing changes well although he did clarify that what he is doing is putting the medicine on in the morning after shower and then subsequently he takes the dressing off at night leaving it open to air. I previously asked him about this and I was not aware that he was doing this. Nonetheless that may be slowing things down a bit at this point. Fortunately there does not appear to be any evidence of severe infection although he does have some evidence of your theme is surrounding the wound bed. He is still using the gentamicin cream. 05/06/18 on evaluation today patient actually presents for evaluation of his right medial malleolus ulcers. He's been tolerating the dressing changes without complication. With that being said it does appear that he is doing better compared to last evaluation. He's not having the pain like he did previous. He also states that blisters that he had coming up when I saw him last did resolve and ended up doing okay he did go to the ER they gave him some cream to put on it he's not sure what the name of the cream was. He was down in [facility] when he called me and I spoke with him previous. Nonetheless he states that they told him to put the medicine on his our due list. With that being said I'm not really 100% sure that the medicine had anything to do with this due to the fact that again he was having the blisters when he came into the office which is why  we actually put them on the anabiotic I was concerned about him having an infection which was causing the blistering and increased pain in his extremity. Nonetheless this is something that we can definitely be cautious of in the future it was Bactrim Dejager, Maria C. ([id number]) that we had him on. Overall I'm glad he is doing better. 05/20/18 on evaluation today patient actually appears to be doing fairly well in regard to his ulcer on the right lateral and medial malleolus locations. Both seem to be doing decently well he does have slough building  up on the surface of the wound. He has been using the Hibiclense as I instructed him. Overall there's no evidence of significant infection which is good news. 06/02/18 on evaluation today patient actually appears to be doing excellent in regard to his right medial and lateral malleolus ulcers. He has been tolerating the dressing changes without complication. There does not appear to be any evidence of infection at this time which is great news. In general I feel like he has made great progress in the past several weeks. He has good granulation noted over areas that have not had good granulation previously and epithelialization even more so in several areas. Both wounds measured smaller. 06/17/18 an evaluation today patient actually appears to be doing excellent in regard to his right lateral and medial malleolus ulcers. He has been tolerating the dressing changes without complication fortunately the Hydrofera Blue Dressing to be doing excellent for him. He's actually making progress each time I see him. 07/08/18 on evaluation today patient's wounds on the right medial and lateral malleolus or region show signs of improvement currently. He has been tolerating the dressing changes without complication at this time. He does feel like the [facility] has been of benefit for him. No fevers, chills, nausea, or vomiting noted at this  time. 07/22/18 and evaluation today patient actually appears to be doing excellent in regard to his right medial and lateral malleolus ulcers. He's been tolerating the dressing changes without complication the Hydrofera Blue Dressing years be excellent for him. Overall I'm extremely pleased in this regard. 08/05/18 on evaluation today patient's right lateral and medial malleolus ulcers appear to be doing well at this point. There does not appear to be any evidence of infection which is good news. Overall he does have some Slough noted which is going to require sharp debridement but other than this I see no current issues 08/19/18 on evaluation today patient actually appears to be doing better yet again in regard to his lower Trinity ulcer on the right medial and lateral malleolus regions. He is having no pain and no significant signs of infection which is great news. Overall things seem to be progressing quite nicely which is great news. No fevers, chills, nausea, or vomiting noted at this time. 09/09/18 on evaluation today patient appears to be doing very well in regard to his medial and lateral ankle ulcers. He's been tolerating the dressing changes without complication. Fortunately there is no evidence of infection at this time which is good news. No fevers, chills, nausea, or vomiting noted at this time. 09/23/18 on evaluation today patient appears to be doing rather well in regard to his right medial and lateral ankle ulcers. He is been tolerating the dressing changes and still things continue to do excellent. Overall very pleased with the progress and there's no signs of infection today. 10/07/18 on evaluation today patient appears to be doing very well in regard to his right medial and lateral ankle ulcers. He's been tolerating the dressing changes without complication. Fortunately there's no signs of infection at this time. Overall the patient states that he is very pleased with how things  seem to be progressing. 10/21/18 on evaluation today patient actually appears to be doing very well in regard to his right medial and lateral malleolus ulcers. He's been tolerating the dressing changes without complication overall I'm very pleased with how things seem to be progressing. There's no signs of infection. 11/04/18 on evaluation today patient actually appears to be feeling  very well at this point. The medial wound bed actually show signs of improvement and looks to be doing very well. The lateral wing then actually is not want to touch with this show signs of increased drainage for some more Slough buildup. He's also having more pain which he states started last night. Nonetheless I'm concerned in this regard about the possibility of infection. No fevers, chills, nausea, or vomiting noted at this time. He has been since August 2019 since he last was pretty for infection. 12/16/18 on evaluation today patient appears to be doing very well in regard to his medial and lateral malleolus ulcers. It has been a little over a month since I've last seen him secondary to family issues that he's had as well as issues with the coronavirus pandemic. Nonetheless he has continued to follow the directions for dressing changes in the interim since I last saw and and appears to be doing excellent. In fact his medial ulcer seems significantly smaller the lateral is also smaller but again it was a larger wound to begin with. Overall I do feel like he's making good progress however. MOSTAFA, YUAN ([id number]) Patient History Information obtained from Patient. Social History Former smoker - 10 years ago, Marital Status - Married, Alcohol Use - Moderate, Drug Use - No History, Caffeine Use - Never. Medical History Eyes Patient has history of Cataracts - removed Denies history of Glaucoma, Optic Neuritis Ear/Nose/Mouth/Throat Denies history of Chronic sinus  problems/congestion Hematologic/Lymphatic Denies history of Anemia, Hemophilia, Human Immunodeficiency Virus, Lymphedema, Sickle Cell Disease Respiratory Patient has history of Chronic Obstructive Pulmonary Disease (COPD) Denies history of Aspiration, Asthma, Pneumothorax, Sleep Apnea, Tuberculosis Cardiovascular Denies history of Angina, Arrhythmia, Congestive Heart Failure, Coronary Artery Disease, Deep Vein Thrombosis, Hypertension, Hypotension, Myocardial Infarction, Peripheral Arterial Disease, Peripheral Venous Disease, Phlebitis, Vasculitis Gastrointestinal Denies history of Cirrhosis , Colitis, Crohn s, Hepatitis A, Hepatitis B, Hepatitis C Endocrine Denies history of Type I Diabetes, Type II Diabetes Genitourinary Denies history of End Stage Renal Disease Immunological Denies history of Lupus Erythematosus, Raynaud s Integumentary (Skin) Denies history of History of Burn, History of pressure wounds Musculoskeletal Patient has history of Osteoarthritis Denies history of Gout, Rheumatoid Arthritis, Osteomyelitis Neurologic Denies history of Dementia, Neuropathy, Quadriplegia, Paraplegia, Seizure Disorder Oncologic Denies history of Received Chemotherapy, Received Radiation Psychiatric Denies history of Anorexia/bulimia, Confinement Anxiety Medical And Surgical History Notes Constitutional Symptoms (General Health) Vein Filter (groin area); CODA; H/O Blood Clots; pulmonary hypertensive arterial disease Review of Systems (ROS) Constitutional Symptoms (General Health) Denies complaints or symptoms of Fatigue, Fever, Chills, Marked Weight Change. Respiratory Denies complaints or symptoms of Chronic or frequent coughs, Shortness of Breath. Cardiovascular Denies complaints or symptoms of Chest pain, LE edema. Psychiatric Denies complaints or symptoms of Anxiety, Claustrophobia. AKSHATH, MCCAREY ([id number]) Objective Constitutional Well-nourished and well-hydrated in  no acute distress. Vitals Time Taken: 8:15 AM, Height: 76 in, Weight: 238 lbs, BMI: 29, Temperature: 97.8 F, Pulse: 72 bpm, Respiratory Rate: 18 breaths/min, Blood Pressure: 154/73 mmHg. Respiratory normal breathing without difficulty. Psychiatric this patient is able to make decisions and demonstrates good insight into disease process. Alert and Oriented x 3. pleasant and cooperative. General Notes: Patient's wounds currently did require some sharp debridement he has some dry exudate as well as Slough noted on the surface of the wound which I was able to carefully sharply debride today without complication post debridement he appears to be doing much better. Integumentary (Hair, Skin) Wound #1 status is Open. Original cause of  wound was Gradually Appeared. The wound is located on the Right,Lateral Malleolus. The wound measures 4.3cm length x 2.6cm width x 0.2cm depth; 8.781cm^2 area and 1.756cm^3 volume. There is Fat Layer (Subcutaneous Tissue) Exposed exposed. There is no tunneling or undermining noted. There is a medium amount of serous drainage noted. The wound margin is flat and intact. There is small (1-33%) pink granulation within the wound bed. There is a large (67-100%) amount of necrotic tissue within the wound bed including Adherent Slough. The periwound skin appearance exhibited: Scarring, Hemosiderin Staining. The periwound skin appearance did not exhibit: Callus, Crepitus, Excoriation, Induration, Rash, Dry/Scaly, Maceration, Atrophie Blanche, Cyanosis, Ecchymosis, Mottled, Pallor, Rubor, Erythema. Periwound temperature was noted as No Abnormality. Wound #2 status is Open. Original cause of wound was Gradually Appeared. The wound is located on the Right,Medial Malleolus. The wound measures 1.2cm length x 0.7cm width x 0.1cm depth; 0.66cm^2 area and 0.066cm^3 volume. There is Fat Layer (Subcutaneous Tissue) Exposed exposed. There is no tunneling or undermining noted. There is a  medium amount of serous drainage noted. The wound margin is flat and intact. There is large (67-100%) red granulation within the wound bed. There is a small (1-33%) amount of necrotic tissue within the wound bed including Adherent Slough. The periwound skin appearance exhibited: Scarring. The periwound skin appearance did not exhibit: Callus, Crepitus, Excoriation, Induration, Rash, Dry/Scaly, Maceration, Atrophie Blanche, Cyanosis, Ecchymosis, Hemosiderin Staining, Mottled, Pallor, Rubor, Erythema. Periwound temperature was noted as No Abnormality. 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 ([id number]) Procedures Wound #1 Pre-procedure diagnosis of Wound #1 is a Venous Leg Ulcer located on the Right,Lateral Malleolus .Severity of Tissue Pre Debridement is: Fat layer exposed. There was a Excisional Skin/Subcutaneous Tissue Debridement with a total area of 11.18 sq cm performed by [name], [name] E., [name]. With the following instrumentDominguezs): Curette to remove Viable and Non-Viable tissue/material. Material removed includes Subcutaneous Tissue, Slough, and Fibrin/Exudate after achieving pain control using Lidocaine 4% Topical Solution. No specimens were taken. A time out was conducted at 08:36, prior to the start of the procedure. A Minimum amount of bleeding was controlled with Pressure. The procedure was tolerated well with a pain level of 0 throughout and a pain level of 0 following the procedure. Post Debridement Measurements: 4.3cm length x 2.6cm width x 0.3cm depth; 2.634cm^3 volume. Character of Wound/Ulcer Post Debridement is improved. Severity of  Tissue Post Debridement is: Fat layer exposed. 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 There was a Excisional Skin/Subcutaneous Tissue Debridement with a total area of 0.84 sq cm performed by [name], [name] E., [name]. With the following instrumentDominguezs): Curette to remove Viable and Non-Viable tissue/material. Material removed includes Subcutaneous Tissue, Slough, and Fibrin/Exudate after achieving pain control using Lidocaine 4% Topical Solution. No specimens were taken. A time out was conducted at 08:41, prior to the start of the procedure. A Minimum amount of bleeding was controlled with Pressure. The procedure was tolerated well with a pain level of 0 throughout and a pain level of 0 following the procedure. Post Debridement Measurements: 1.2cm length x 0.7cm width x 0.2cm  depth; 0.132cm^3 volume. Character of Wound/Ulcer Post Debridement is improved. Severity of Tissue Post Debridement is: Fat layer exposed. Post procedure Diagnosis Wound #2: Same as Pre-Procedure Plan Wound Cleansing: Wound #1 Right,Lateral Malleolus: Clean wound with Normal Saline. May Shower, gently pat wound dry prior to applying new dressing. - wash with hibiclens daily and leave on 5 mins Wound #2 Right,Medial Malleolus: Clean wound with Normal Saline. May Shower, gently pat wound dry prior to applying new dressing. - wash with hibiclens and leave on 5 mins Anesthetic (add to Medication List): Wound #1 Right,Lateral Malleolus: Topical Lidocaine 4% cream applied to wound bed prior to debridement (In Clinic Only). Wound #2 Right,Medial Malleolus: Topical Lidocaine 4% cream applied to wound bed prior to debridement (In Clinic Only). Primary Wound Dressing: Wound #1 Right,Lateral Malleolus: Hydrafera Blue Ready Transfer Other: - Gentamicin  cream to wound bed Wound #2 Right,Medial Malleolus: Hydrafera Blue Ready Transfer CORRION, STIREWALT ([id number]) Other: - Gentamicin cream to wound bed Secondary Dressing: Wound #1 Right,Lateral Malleolus: Dry Gauze Conform/Kerlix Wound #2 Right,Medial Malleolus: Dry Gauze Conform/Kerlix Dressing Change Frequency: Wound #1 Right,Lateral Malleolus: Change dressing every other day. Wound #2 Right,Medial Malleolus: Change dressing every other day. Follow-up Appointments: Wound #1 Right,Lateral Malleolus: Return Appointment in 2 weeks. Wound #2 Right,Medial Malleolus: Return Appointment in 2 weeks. Edema Control: Wound #1 Right,Lateral Malleolus: Elevate legs to the level of the heart and pump ankles as often as possible Wound #2 Right,Medial Malleolus: Elevate legs to the level of the heart and pump ankles as often as possible Additional Orders / Instructions: Wound #1 Right,Lateral Malleolus: Increase protein intake. Wound #2 Right,Medial Malleolus: Increase protein intake. Medications-please add to medication list.: Wound #1 Right,Lateral Malleolus: P.O. Antibiotics The following medicationDominguezs) was prescribed: Bactrim DS oral 800 mg-160 mg tablet 1 1 tablet oral taken 2 times a day for 10 days starting 12/16/2018 My suggestion at this point is gonna be that we continue with the above wound care measures for the next week and the patient is in agreement with that plan. We will subsequently see were things stand at follow-up. If anything changes or worsens meantime he let me know. He did request due to some increase drainage that is noted that I give him any refill of the anabiotic for short time. He seems to be doing so well I do not want to backtrack since he is noticing an increase in the drainage which previously has heralded an increase in the infection I'm gonna go ahead and refill the Bactrim for him today. We will subsequently see him back for reevaluation in two  weeks. Please see above for specific wound care orders. We will see patient for re-evaluation in 2 weekDominguezs) here in the clinic. If anything worsens or changes patient will contact our office for additional recommendations. Electronic SignatureDominguezs) Signed: 12/19/2018 8:49:36 AM By: [name] Kelp [name] Entered By: [name] on 12/16/2018 08:50:34 [name]) -------------------------------------------------------------------------------- ROS/PFSH Details Patient Name: [name] Date of Service: 12/16/2018 8:15 AM Medical Record Number: [id number] Patient Account Number: [id number] Date of Birth/Sex: 1945/09/15 (73 y.o. M) Treating RN: [name] Primary Care Provider: [name] Other Clinician: Referring Provider: [name] Treating Provider/Extender: [name], [name] Weeks in Treatment: 152 Information Obtained From Patient Constitutional Symptoms (General Health) Complaints and Symptoms: Negative for: Fatigue; Fever; Chills; Marked Weight Change Medical History: Past Medical History Notes: Vein Filter (groin area); CODA; H/O Blood Clots; pulmonary hypertensive arterial disease Respiratory Complaints and Symptoms: Negative for: Chronic  or frequent coughs; Shortness of Breath Medical History: Positive for: Chronic Obstructive Pulmonary Disease (COPD) Negative for: Aspiration; Asthma; Pneumothorax; Sleep Apnea; Tuberculosis Cardiovascular Complaints and Symptoms: Negative for: Chest pain; LE edema Medical History: Negative for: Angina; Arrhythmia; Congestive Heart Failure; Coronary Artery Disease; Deep Vein Thrombosis; Hypertension; Hypotension; Myocardial Infarction; Peripheral Arterial Disease; Peripheral Venous Disease; Phlebitis; Vasculitis Psychiatric Complaints and Symptoms: Negative for: Anxiety; Claustrophobia Medical History: Negative for: Anorexia/bulimia; Confinement Anxiety Eyes Medical History: Positive for: Cataracts -  removed Negative for: Glaucoma; Optic Neuritis Ear/Nose/Mouth/Throat Medical History: Negative for: Chronic sinus problems/congestion LINDELL, RENFREW ([id number]) Hematologic/Lymphatic Medical History: Negative for: Anemia; Hemophilia; Human Immunodeficiency Virus; Lymphedema; Sickle Cell Disease Gastrointestinal Medical History: Negative for: Cirrhosis ; Colitis; Crohnos; Hepatitis A; Hepatitis B; Hepatitis C Endocrine Medical History: Negative for: Type I Diabetes; Type II Diabetes Genitourinary Medical History: Negative for: End Stage Renal Disease Immunological Medical History: Negative for: Lupus Erythematosus; Raynaudos Integumentary (Skin) Medical History: Negative for: History of Burn; History of pressure wounds Musculoskeletal Medical History: Positive for: Osteoarthritis Negative for: Gout; Rheumatoid Arthritis; Osteomyelitis Neurologic Medical History: Negative for: Dementia; Neuropathy; Quadriplegia; Paraplegia; Seizure Disorder Oncologic Medical History: Negative for: Received Chemotherapy; Received Radiation HBO Extended History Items Eyes: Cataracts Immunizations Pneumococcal Vaccine: Received Pneumococcal Vaccination: No Implantable Devices No devices added Family and Social History Former smoker - 10 years ago; Marital Status - Married; Alcohol Use: Moderate; Drug Use: No History; Caffeine Use: Never TOBY, BREITHAUPT ([id number]) Physician Affirmation I have reviewed and agree with the above information. Electronic SignatureDominguezs) Signed: 12/16/2018 2:55:40 PM By: [name] Signed: 12/19/2018 8:49:36 AM By: [name] Kelp [name] Entered By: [name] on 12/16/2018 08:49:42 [name]) -------------------------------------------------------------------------------- SuperBill Details Patient Name: [name] Date of Service: 12/16/2018 Medical Record Number: [id number] Patient Account Number:  [id number] Date of Birth/Sex: [birth year] (73 y.o. 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, sequela T81.31XS Disruption of external operation (surgical) wound, not elsewhere classified, sequela Facility Procedures CPT4 Code: 88416606 Description: 11042 - DEB SUBQ TISSUE 20 SQ CM/< ICD-10 Diagnosis Description L97.312 Non-pressure chronic ulcer of right ankle with fat layer ex Modifier: posed Quantity: 1 Physician Procedures CPT4 Code: 3016010 Description: 11042 - WC PHYS SUBQ TISS 20 SQ CM ICD-10 Diagnosis Description L97.312 Non-pressure chronic ulcer of right ankle with fat layer ex Modifier: posed Quantity: 1 Electronic SignatureDominguezs) Signed: 12/19/2018 8:49:36 AM By: [name] Kelp [name] Entered By: [name] on 12/16/2018 08:51:13

## 2018-12-19 NOTE — Progress Notes (Signed)
[name], [name] ([id number]) Visit Report for 12/16/2018 Arrival Information Details Patient Name: [name]. Date of Service: 12/16/2018 8:15 AM Medical Record Number: [id number] Patient Account Number: [id number] Date of Birth/Sex: [birth year] (74 y.o. M) Treating RN: [name] Primary Care [name]: [name] Other Clinician: Referring [name]: [name] Treating [name]/Extender: [name], [name] in Treatment: 152 Visit Information History Since Last Visit Added or deleted any medications: No Patient Arrived: Ambulatory Any new allergies or adverse reactions: No Arrival Time: 08:14 Had a fall or experienced change in No Accompanied By: self activities of daily living that may affect Transfer Assistance: None risk of falls: Patient Identification Verified: Yes Signs or symptoms of abuse/neglect since last visito No Secondary Verification Process Yes Hospitalized since last visit: No Completed: Implantable device outside of the clinic excluding No Patient Requires Transmission-Based No cellular tissue based products placed in the center Precautions: since last visit: Patient Has Alerts: Yes Has Dressing in Place as Prescribed: Yes Patient Alerts: Patient on Blood Pain Present Now: No Thinner Electronic Signature(s) Signed: 12/16/2018 2:55:40 PM By: [name] Entered By: [name] on 12/16/2018 08:15:00 [name] ([id number]) -------------------------------------------------------------------------------- Encounter Discharge Information Details Patient Name: [name] Date of Service: 12/16/2018 8:15 AM Medical Record Number: [id number] Patient Account Number: [id number] Date of Birth/Sex: 07-04-1946 (73 y.o. M) Treating RN: [name] Primary Care Rechy Bost: [name] Other Clinician: Referring Enzo Treu: [name] Treating Janyia Guion/Extender: [name], [name] in Treatment: 152 Encounter Discharge  Information Items Post Procedure Vitals Discharge Condition: Stable Temperature (F): 97.8 Ambulatory Status: Ambulatory Pulse (bpm): 72 Discharge Destination: Home Respiratory Rate (breaths/min): 16 Transportation: Private Auto Blood Pressure (mmHg): 154/73 Accompanied By: self Schedule Follow-up Appointment: Yes Clinical Summary of Care: Electronic Signature(s) Signed: 12/16/2018 8:57:50 AM By: [name] Previous Signature: 12/16/2018 8:56:49 AM Version By: [name] Entered By: [name] on 12/16/2018 08:57:50 [name] ([id number]) -------------------------------------------------------------------------------- Lower Extremity Assessment Details Patient Name: [name] Date of Service: 12/16/2018 8:15 AM Medical Record Number: [id number] Patient Account Number: [id number] Date of Birth/Sex: 1946-03-29 (73 y.o. M) Treating RN: [name] Primary Care Kester Stimpson: [name] Other Clinician: Referring Anahi Belmar: [name] Treating Qamar Aughenbaugh/Extender: STONE III, [name] in Treatment: 152 Edema Assessment Assessed: [Left: No] [Right: No] Edema: [Left: N] [Right: o] Vascular Assessment Pulses: Dorsalis Pedis Palpable: [Right:Yes] Electronic Signature(s) Signed: 12/16/2018 2:55:40 PM By: [name] Entered By: [name] on 12/16/2018 08:28:06 [name] ([id number]) -------------------------------------------------------------------------------- Multi Wound Chart Details Patient Name: [name] Date of Service: 12/16/2018 8:15 AM Medical Record Number: [id number] Patient Account Number: [id number] Date of Birth/Sex: [birth year] (73 y.o. M) Treating RN: [name] Primary Care Desirey Keahey: [name] Other Clinician: Referring Dakai Braithwaite: [name] Treating Aubre Quincy/Extender: STONE III, [name] in Treatment: 152 Vital Signs Height(in): 76 Pulse(bpm): 72 Weight(lbs): 238 Blood Pressure(mmHg):  154/73 Body Mass Index(BMI): 29 Temperature(F): 97.8 Respiratory Rate 18 (breaths/min): Photos: [N/A:N/A] Wound Location: Right Malleolus - Lateral Right Malleolus - Medial N/A Wounding Event: Gradually Appeared Gradually Appeared N/A Primary Etiology: Venous Leg Ulcer Venous Leg Ulcer N/A Comorbid History: Cataracts, Chronic Obstructive Cataracts, Chronic Obstructive N/A Pulmonary Disease (COPD), Pulmonary Disease (COPD), Osteoarthritis Osteoarthritis Date Acquired: 08/11/2015 01/30/2016 N/A [name] of Treatment: 152 150 N/A Wound Status: Open Open N/A Clustered Wound: No Yes N/A Measurements L x W x D 4.3x2.6x0.2 1.2x0.7x0.1 N/A (cm) Area (cm) : 8.781 0.66 N/A Volume (cm) : 1.756 0.066 N/A % Reduction in Area: 49.20% 94.30% N/A % Reduction in Volume: 66.10% 94.30%  N/A Classification: Full Thickness Without Full Thickness Without N/A Exposed Support Structures Exposed Support Structures Exudate Amount: Medium Medium N/A Exudate Type: Serous Serous N/A Exudate Color: amber amber N/A Wound Margin: Flat and Intact Flat and Intact N/A Granulation Amount: Small (1-33%) Large (67-100%) N/A Granulation Quality: Pink Red N/A Necrotic Amount: Large (67-100%) Small (1-33%) N/A Exposed Structures: Fat Layer (Subcutaneous Fat Layer (Subcutaneous N/A Tissue) Exposed: Yes Tissue) Exposed: Yes Fascia: No Fascia: No Tendon: No Tendon: No Raiche, Macaulay C. ([id number]) Muscle: No Muscle: No Joint: No Joint: No Bone: No Bone: No Epithelialization: Small (1-33%) None N/A Periwound Skin Texture: Scarring: Yes Scarring: Yes N/A Excoriation: No Excoriation: No Induration: No Induration: No Callus: No Callus: No Crepitus: No Crepitus: No Rash: No Rash: No Periwound Skin Moisture: Maceration: No Maceration: No N/A Dry/Scaly: No Dry/Scaly: No Periwound Skin Color: Hemosiderin Staining: Yes Atrophie Blanche: No N/A Atrophie Blanche: No Cyanosis: No Cyanosis:  No Ecchymosis: No Ecchymosis: No Erythema: No Erythema: No Hemosiderin Staining: No Mottled: No Mottled: No Pallor: No Pallor: No Rubor: No Rubor: No Temperature: No Abnormality No Abnormality N/A Tenderness on Palpation: No No N/A Treatment Notes Electronic Signature(s) Signed: 12/16/2018 2:55:40 PM By: [name] Entered By: [name] on 12/16/2018 08:35:13 [name] ([id number]) -------------------------------------------------------------------------------- Multi-Disciplinary Care Plan Details Patient Name: [name] Date of Service: 12/16/2018 8:15 AM Medical Record Number: [id number] Patient Account Number: [id number] Date of Birth/Sex: 1945/09/01 (73 y.o. M) Treating RN: [name] Primary Care Saadiq Poche: [name] Other Clinician: Referring Lizzett Nobile: [name] Treating Derrika Ruffalo/Extender: STONE III, [name] in Treatment: 152 Active Inactive Venous Leg Ulcer Nursing Diagnoses: Knowledge deficit related to disease process and management Goals: Patient will maintain optimal edema control Date Initiated: 04/03/2016 Target Resolution Date: 11/24/2016 Goal Status: Active Interventions: Compression as ordered Treatment Activities: Therapeutic compression applied : 04/03/2016 Notes: Wound/Skin Impairment Nursing Diagnoses: Impaired tissue integrity Goals: Ulcer/skin breakdown will heal within 14 [name] Date Initiated: 01/17/2016 Target Resolution Date: 11/24/2016 Goal Status: Active Interventions: Assess ulceration(s) every visit Notes: Electronic Signature(s) Signed: 12/16/2018 2:55:40 PM By: [name] Entered By: [name] on 12/16/2018 08:35:00 [name] ([id number]) -------------------------------------------------------------------------------- Pain Assessment Details Patient Name: [name] Date of Service: 12/16/2018 8:15 AM Medical Record Number: [id number] Patient Account Number:  [id number] Date of Birth/Sex: 1945/09/24 (73 y.o. M) Treating RN: [name] Primary Care Laramie Gelles: [name] Other Clinician: Referring Nanci Lakatos: [name] Treating Halla Chopp/Extender: STONE III, [name] in Treatment: 152 Active Problems Location of Pain Severity and Description of Pain Patient Has Paino No Site Locations Pain Management and Medication Current Pain Management: Electronic Signature(s) Signed: 12/16/2018 2:55:40 PM By: [name] Entered By: [name] on 12/16/2018 08:15:08 [name] ([id number]) -------------------------------------------------------------------------------- Patient/Caregiver Education Details Patient Name: [name] Date of Service: 12/16/2018 8:15 AM Medical Record Number: [id number] Patient Account Number: [id number] Date of Birth/Gender: [birth year] (73 y.o. M) Treating RN: [name] Primary Care Physician: [name] Other Clinician: Referring Physician: Darreld [name] Treating Physician/Extender: Skeet Simmer in Treatment: 152 Education Assessment Education Provided To: Patient Education Topics Provided Wound/Skin Impairment: Handouts: Other: wound care as ordered Methods: Explain/Verbal Responses: State content correctly Electronic Signature(s) Signed: 12/16/2018 2:55:40 PM By: [name] Entered By: [name] on 12/16/2018 08:58:22 Strassner, [name] ([id number]) -------------------------------------------------------------------------------- Wound Assessment Details Patient Name: [name] Date of Service: 12/16/2018 8:15 AM Medical Record Number: [id number] Patient Account Number: [id number] Date of Birth/Sex: 08-22-1946 (72 y.o. M) Treating RN: [name] Primary Care Desiray Orchard: MILES,  LINDA Other Clinician: Referring Jovie Swanner: MILES, LINDA Treating Waynesha Rammel/Extender: STONE III, [name] in Treatment: 152 Wound Status Wound Number: 1 Primary Venous  Leg Ulcer Etiology: Wound Location: Right Malleolus - Lateral Wound Open Wounding Event: Gradually Appeared Status: Date Acquired: 08/11/2015 Comorbid Cataracts, Chronic Obstructive Pulmonary [name] Of Treatment: 152 History: Disease (COPD), Osteoarthritis Clustered Wound: No Photos Photo Uploaded By: [name] on 12/16/2018 08:29:01 Wound Measurements Length: (cm) 4.3 Width: (cm) 2.6 Depth: (cm) 0.2 Area: (cm) 8.781 Volume: (cm) 1.756 % Reduction in Area: 49.2% % Reduction in Volume: 66.1% Epithelialization: Small (1-33%) Tunneling: No Undermining: No Wound Description Full Thickness Without Exposed Support Classification: Structures Wound Margin: Flat and Intact Exudate Medium Amount: Exudate Type: Serous Exudate Color: amber Foul Odor After Cleansing: No Slough/Fibrino Yes Wound Bed Granulation Amount: Small (1-33%) Exposed Structure Granulation Quality: Pink Fascia Exposed: No Necrotic Amount: Large (67-100%) Fat Layer (Subcutaneous Tissue) Exposed: Yes Necrotic Quality: Adherent Slough Tendon Exposed: No Muscle Exposed: No Joint Exposed: No Bone Exposed: No Swor, Yuvin C. ([id number]) Periwound Skin Texture Texture Color No Abnormalities Noted: No No Abnormalities Noted: No Callus: No Atrophie Blanche: No Crepitus: No Cyanosis: No Excoriation: No Ecchymosis: No Induration: No Erythema: No Rash: No Hemosiderin Staining: Yes Scarring: Yes Mottled: No Pallor: No Moisture Rubor: No No Abnormalities Noted: No Dry / Scaly: No Temperature / Pain Maceration: No Temperature: No Abnormality Treatment Notes Wound #1 (Right, Lateral Malleolus) Notes gentamicin cream to wound bed, hydrafera blue, ABD pad, and conform secured with Medipore tape. Electronic Signature(s) Signed: 12/16/2018 2:55:40 PM By: [name] Entered By: [name] on 12/16/2018 08:27:18 [name]  ([id number]) -------------------------------------------------------------------------------- Wound Assessment Details Patient Name: [name] Date of Service: 12/16/2018 8:15 AM Medical Record Number: [id number] Patient Account Number: [id number] Date of Birth/Sex: [birth year] (73 y.o. M) Treating RN: [name] Primary Care Jermaine Neuharth: [name] Other Clinician: Referring Docie Abramovich: [name] Treating Detria Cummings/Extender: STONE III, [name] in Treatment: 152 Wound Status Wound Number: 2 Primary Venous Leg Ulcer Etiology: Wound Location: Right Malleolus - Medial Wound Open Wounding Event: Gradually Appeared Status: Date Acquired: 01/30/2016 Comorbid Cataracts, Chronic Obstructive Pulmonary [name] Of Treatment: 150 History: Disease (COPD), Osteoarthritis Clustered Wound: Yes Photos Photo Uploaded By: [name] on 12/16/2018 08:29:02 Wound Measurements Length: (cm) 1.2 Width: (cm) 0.7 Depth: (cm) 0.1 Area: (cm) 0.66 Volume: (cm) 0.066 % Reduction in Area: 94.3% % Reduction in Volume: 94.3% Epithelialization: None Tunneling: No Undermining: No Wound Description Full Thickness Without Exposed Support Classification: Structures Wound Margin: Flat and Intact Exudate Medium Amount: Exudate Type: Serous Exudate Color: amber Foul Odor After Cleansing: No Slough/Fibrino Yes Wound Bed Granulation Amount: Large (67-100%) Exposed Structure Granulation Quality: Red Fascia Exposed: No Necrotic Amount: Small (1-33%) Fat Layer (Subcutaneous Tissue) Exposed: Yes Necrotic Quality: Adherent Slough Tendon Exposed: No Muscle Exposed: No Joint Exposed: No Bone Exposed: No Almanza, Ramzey C. ([id number]) Periwound Skin Texture Texture Color No Abnormalities Noted: No No Abnormalities Noted: No Callus: No Atrophie Blanche: No Crepitus: No Cyanosis: No Excoriation: No Ecchymosis: No Induration: No Erythema: No Rash: No Hemosiderin Staining:  No Scarring: Yes Mottled: No Pallor: No Moisture Rubor: No No Abnormalities Noted: No Dry / Scaly: No Temperature / Pain Maceration: No Temperature: No Abnormality Treatment Notes Wound #2 (Right, Medial Malleolus) Notes gentamicin cream to wound bed, hydrafera blue, ABD pad, and conform secured with Medipore tape. Electronic Signature(s) Signed: 12/16/2018 2:55:40 PM By: [name] Entered By: [name] on 12/16/2018 08:27:46 [name] ([id number]) -------------------------------------------------------------------------------- Vitals Details Patient  Name: Wenzler, Suresh C. Date of Service: 12/16/2018 8:15 AM Medical Record Number: [id number] Patient Account Number: [id number] Date of Birth/Sex: [birth year](72 y.o. M) Treating RN: [name] Sitesorthy, Joanna Primary Care Pastor Sgro: [name] McleanMILES, LINDA Other Clinician: Referring Cherlyn Syring: [name] McleanMILES, LINDA Treating Maria Gallicchio/Extender: STONE III, [name] in Treatment: 152 Vital Signs Time Taken: 08:15 Temperature (F): 97.8 Height (in): 76 Pulse (bpm): 72 Weight (lbs): 238 Respiratory Rate (breaths/min): 18 Body Mass Index (BMI): 29 Blood Pressure (mmHg): 154/73 Reference Range: 80 - 120 mg / dl Electronic Signature(s) Signed: 12/16/2018 2:55:40 PM By: [name] Sitesorthy, Joanna Entered By: [name] Sitesorthy, Joanna on 12/16/2018 08:20:14

## 2018-12-30 ENCOUNTER — Ambulatory Visit: Payer: Medicare HMO | Admitting: Physician Assistant

## 2019-01-06 ENCOUNTER — Ambulatory Visit: Payer: Medicare HMO | Admitting: Physician Assistant

## 2019-01-13 ENCOUNTER — Other Ambulatory Visit: Payer: Self-pay

## 2019-01-13 ENCOUNTER — Encounter: Payer: Medicare HMO | Attending: Physician Assistant | Admitting: Physician Assistant

## 2019-01-13 DIAGNOSIS — T85613S Breakdown (mechanical) of artificial skin graft and decellularized allodermis, sequela: Secondary | ICD-10-CM | POA: Insufficient documentation

## 2019-01-13 DIAGNOSIS — Z7901 Long term (current) use of anticoagulants: Secondary | ICD-10-CM | POA: Insufficient documentation

## 2019-01-13 DIAGNOSIS — L97312 Non-pressure chronic ulcer of right ankle with fat layer exposed: Secondary | ICD-10-CM | POA: Insufficient documentation

## 2019-01-13 DIAGNOSIS — T8131XS Disruption of external operation (surgical) wound, not elsewhere classified, sequela: Secondary | ICD-10-CM | POA: Insufficient documentation

## 2019-01-13 DIAGNOSIS — J449 Chronic obstructive pulmonary disease, unspecified: Secondary | ICD-10-CM | POA: Insufficient documentation

## 2019-01-13 DIAGNOSIS — I87331 Chronic venous hypertension (idiopathic) with ulcer and inflammation of right lower extremity: Secondary | ICD-10-CM | POA: Diagnosis not present

## 2019-01-13 DIAGNOSIS — Z87891 Personal history of nicotine dependence: Secondary | ICD-10-CM | POA: Insufficient documentation

## 2019-01-13 DIAGNOSIS — L97322 Non-pressure chronic ulcer of left ankle with fat layer exposed: Secondary | ICD-10-CM | POA: Insufficient documentation

## 2019-01-14 NOTE — Progress Notes (Signed)
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 larger. Again per the patient she did not feel a skin graft or operative debridement was indicated The patient had previous venous ablation in February 2018. Last arterial studies were in December 2017 and were within normal limits 06/18/17; patient arrives back in clinic today using Anasept gel with sorbact. He changes this every day is wearing his own compression stocking. The patient actually saw Dr. Leta Baptist of plastic surgery. I've reviewed her note. The appointment was on 05/30/17. The basic issue with here was that she did not feel that skin grafts for patients with underlying stasis and edema have a good long-term success rate. She also discuss skin substitutes which has been done here as well however I have not been able to get the surface of these wounds to something that I think would support skin substitutes. This is why he felt he might need an operative debridement more aggressively than I can do in this clinic Review of systems; is otherwise negative he feels well 07/02/17; patient has been using Anasept gel with Sorbact. He changes this every day scrubs out the wound beds.  Arrives today with the wound bed looking some better. Easier debridement 07/16/17; patient has been using Anasept gel was sorbact for about a month now. The necrotic service on his wound bed is better and the drainage is now down to minimal but we've not made any improvements in the epithelialization. Change to Santyl today. Surface of the wound is still not good enough to support skin substitutes 07/30/17 on evaluation today patient appears to be doing very well in regard to his right bimalleolus wounds. He has been tolerating the  treatment with the Anasept gel and sorbact. However we were going to try and switch to Santyl to be more aggressive with these wounds. This was however cost prohibitive. Therefore we will likely go back to the sorbact. Nonetheless he is not having any significant discomfort he still is forming slough over the wound location but nothing too significant. The wounds do appear to be filling in nicely. 08/13/17; I have not seen this wound in about a month. There is not much change. The fact the area medially looks larger. He has been using sorbact and Anasept for over a month now without much effect. Arrives in clinic stating that he does not want the area debrided 08/28/17; patient arrives with the areas on his right medial and right lateral ankle worse. The wounds of expanded there is erythema and still drainage. There is no pain and no tenderness. We had been using Keracel AG clearly not doing well. 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 He states that even the evening that we began utilizing the Iodoflex he started having burning. Subsequently this never really improved and he eventually discontinue the use  of the Iodoflex altogether. Unfortunately he did not let us know about any of this until today. I'm unsure of any specific infection at this point although I am going to obtain a wound culture today in order to see if there's anything that could potentially be causing an infection as well. It does sound as if he's had the Iodoflex before and did not have this kind of reaction although this does sound very specific for a reaction to the Iodoflex. 09/17/17 on evaluation today patient appears to be doing significantly better compared to last week's evaluation. At that time it actually appears that he did have an infection the good news is that we have been able to get this under control with switching to the [facility] solution he's not having as much pain and discomfort he still does have some erythema surrounding the medial aspect wound. He has been tolerating the Dakin's soaked gauze dressing which is excellent and that his wounds do not seem to have as much adherent slough. Overall I'm pleased with the progress she's made in last week. 11/05/17 on evaluation today patient appears to be doing better in regard to his right medial and lateral malleolus ulcers. He has been tolerating the dressing changes without complication. I do flight the Freada Bergeron is helping with additional granulation at this point in the wound beds do appear to be a little bit better. With that being said patient does not appear to have any Stejskal, Jemarion C. ([id number]) evidence of significant infection at this point there is no erythema as he has previously noted he does note that the 30 day course of antibiotics I placed him on will end tomorrow. 11/12/17 on evaluation today patient actually appears to be doing excellent in regard to his right medial and lateral malleolus ulcers. He has been tolerating the dressing changes without complication. Fortunately he has no evidence of infection at this time and overall I believe he is  progressing extremely nicely he has a lot of new epithelialization noted on evaluation today. Patient likewise is very pleased with the progress even  just since last week. 11/19/17 on evaluation today patient appears to be doing about the same in regard to his ulcerations. He does not have any additional breakdown although he really has not noted any significant improvement either over the past week. With that being said the more concerning thing at this time is that he is beginning to show signs of erythema surrounding both wounds which is what typically happens and then he will develop into a more overt infection. This has been the course for some time. I hope that doing the 30 day in a biotic cycle this past time would break that so that he would not have the same type of thing happen again. Nonetheless it appears that is digressing back into this infected state unfortunately. With that being said he is not having any pain currently which is good he typically does start to hurt more as this progresses. Fortunately there does not appear to be any evidence of infection systemically which is good news. 11/26/17 on evaluation today patient appears to actually be doing rather well in regard to his ulcer compared to last week. He still has your theme and noted around both the medial and lateral malleolus ulcers of the right lower extremity. He does not seem to be quite as overtly infected however. He has been tolerating the dressing changes without complication which is good news. The gentamicin cream does seem to have been of benefit for him. With that being said he does actually have an appointment with Dr. Sampson Goon this morning to see if there's anything that would be recommended in the way of IV antibiotic therapy. Otherwise he still has slough noted on the surface of the wound. 12/03/17 on evaluation today patient presents for follow-up concerning his ongoing right lower extremity ulcers. He has  been tolerating the dressing changes without complication he states he has not really been using gentamicin on the lateral ankle and this seems to be doing very well. For that reason I think that is probably fine to put a hold on the gentamicin he states his burns and stings when he applies it and we maybe be able to just use this intermittently when things become more irritated. Other than that the good news is I did review his MRI which was performed on 11/28/17 and revealed that he has a negative MRI for abscess, osteomyelitis, or septic joint. Obviously these were the issues that I was concerned with the possibility of and I'm pleased to find that there is no problems in that regard. I did also review Dr. Jarrett Ables note from infectious disease he discussed performed some lab work which apparently was done and then subsequently was going to consider the possibility of Keflex long-term for prevention of infection although this has not been prescribed as of yet. 12/09/17 on evaluation today patient appears to be doing a little bit worse in regard to his right medial and lateral malleolus ulcers. Fortunately the wound does not seem to be significantly worse although he does have a lot more drainage especially the lateral aspect he is having a lot more pain than what he has noted more recently. Fortunately there does not appear to be any evidence of systemic infection which is good news. Again he has seen Dr. Sampson Goon but he has not been placed on the potential Keflex which was recommended as a possibility at least yet. 12/17/17 unfortunately on evaluation today the patient's wound appears to be doing much worse. He has been performing the dressing  changes as directed. Upon a review of notes in epic it does appear that Dr. Jarrett Ables office specifically sending Keflex for the patient on the 16th which was last Tuesday. With that being said the patient states that though this was sent into [facility]  that [facility] stated they never received it and subsequently the patient never took anything. He tells me he has been in bed since Friday due to the increased pain and overall general malaise. No fevers, chills, nausea, or vomiting noted at this time. 02/25/18 on evaluation today patient appears to be doing rather well in regard to his bilateral right malleolus ulcers. He has been tolerating the dressing changes without complication. Currently there does not appear to be any evidence of infection. Overall I'm very pleased with the progress that seems to been made up to this point. 03/11/18 on evaluation today patient actually appears to be doing very well in regard to his right ankle ulcers. He's been tolerating the dressing changes without complication. Fortunately there does not appear to be any evidence of sniffing infection I do believe that the gentamicin cream continues to be of benefit for him. Fortunately he is showing signs of healing in which time I see him. He also tells me he's having no pain. 03/25/18 on evaluation today patient actually appears to show signs of improvement especially in regard to the right lateral ankle ulcer. He has been tolerating the dressing changes without complication. In general I'm very pleased with the progress that has been made up to this point. 04/08/18- He presents in follow up evaluation for right bimalleolar wounds; there is essentially no change. He admits to removing the dressing at night to allow the wounds to be "air out". We will continue with same treatment plan and he will follow up in two weeks 04/22/18 on evaluation today patient seems very frustrated with this situation currently as far as the ulcer on his right medial and lateral malleolus is concerned. He states this has been going on a very long time and obviously he does have a lot of bills as well is far as wound care is concerned. With that being said he tells me that he's been tolerating the  dressing changes well although he did clarify that what he is doing is putting the medicine on in the morning after shower and then subsequently he Bermea, Maciej C. ([id number]) takes the dressing off at night leaving it open to air. I previously asked him about this and I was not aware that he was doing this. Nonetheless that may be slowing things down a bit at this point. Fortunately there does not appear to be any evidence of severe infection although he does have some evidence of your theme is surrounding the wound bed. He is still using the gentamicin cream. 05/06/18 on evaluation today patient actually presents for evaluation of his right medial malleolus ulcers. He's been tolerating the dressing changes without complication. With that being said it does appear that he is doing better compared to last evaluation. He's not having the pain like he did previous. He also states that blisters that he had coming up when I saw him last did resolve and ended up doing okay he did go to the ER they gave him some cream to put on it he's not sure what the name of the cream was. He was down in [facility] when he called me and I spoke with him previous. Nonetheless he states that they told him to put  the medicine on his our due list. With that being said I'm not really 100% sure that the medicine had anything to do with this due to the fact that again he was having the blisters when he came into the office which is why we actually put them on the anabiotic I was concerned about him having an infection which was causing the blistering and increased pain in his extremity. Nonetheless this is something that we can definitely be cautious of in the future it was Bactrim that we had him on. Overall I'm glad he is doing better. 05/20/18 on evaluation today patient actually appears to be doing fairly well in regard to his ulcer on the right lateral and medial malleolus locations. Both seem to be doing decently  well he does have slough building up on the surface of the wound. He has been using the Hibiclense as I instructed him. Overall there's no evidence of significant infection which is good news. 06/02/18 on evaluation today patient actually appears to be doing excellent in regard to his right medial and lateral malleolus ulcers. He has been tolerating the dressing changes without complication. There does not appear to be any evidence of infection at this time which is great news. In general I feel like he has made great progress in the past several weeks. He has good granulation noted over areas that have not had good granulation previously and epithelialization even more so in several areas. Both wounds measured smaller. 06/17/18 an evaluation today patient actually appears to be doing excellent in regard to his right lateral and medial malleolus ulcers. He has been tolerating the dressing changes without complication fortunately the Hydrofera Blue Dressing to be doing excellent for him. He's actually making progress each time I see him. 07/08/18 on evaluation today patient's wounds on the right medial and lateral malleolus or region show signs of improvement currently. He has been tolerating the dressing changes without complication at this time. He does feel like the [facility] Dressing has been of benefit for him. No fevers, chills, nausea, or vomiting noted at this time. 07/22/18 and evaluation today patient actually appears to be doing excellent in regard to his right medial and lateral malleolus ulcers. He's been tolerating the dressing changes without complication the Hydrofera Blue Dressing years be excellent for him. Overall I'm extremely pleased in this regard. 08/05/18 on evaluation today patient's right lateral and medial malleolus ulcers appear to be doing well at this point. There does not appear to be any evidence of infection which is good news. Overall he does have some Slough  noted which is going to require sharp debridement but other than this I see no current issues 08/19/18 on evaluation today patient actually appears to be doing better yet again in regard to his lower [place] ulcer on the right medial and lateral malleolus regions. He is having no pain and no significant signs of infection which is great news. Overall things seem to be progressing quite nicely which is great news. No fevers, chills, nausea, or vomiting noted at this time. 09/09/18 on evaluation today patient appears to be doing very well in regard to his medial and lateral ankle ulcers. He's been tolerating the dressing changes without complication. Fortunately there is no evidence of infection at this time which is good news. No fevers, chills, nausea, or vomiting noted at this time. 09/23/18 on evaluation today patient appears to be doing rather well in regard to his right medial and lateral ankle ulcers.  He is been tolerating the dressing changes and still things continue to do excellent. Overall very pleased with the progress and there's no signs of infection today. 10/07/18 on evaluation today patient appears to be doing very well in regard to his right medial and lateral ankle ulcers. He's been tolerating the dressing changes without complication. Fortunately there's no signs of infection at this time. Overall the patient states that he is very pleased with how things seem to be progressing. 10/21/18 on evaluation today patient actually appears to be doing very well in regard to his right medial and lateral malleolus ulcers. He's been tolerating the dressing changes without complication overall I'm very pleased with how things seem to be progressing. There's no signs of infection. 11/04/18 on evaluation today patient actually appears to be feeling very well at this point. The medial wound bed actually show signs of improvement and looks to be doing very well. The lateral wing then actually is not  want to touch with this show signs of increased drainage for some more Slough buildup. He's also having more pain which he states started last night. Nonetheless I'm concerned in this regard about the possibility of infection. No fevers, chills, nausea, or vomiting noted at this time. He has been since August 2019 since he last was pretty for infection. [name], [name] ([id number]) 12/16/18 on evaluation today patient appears to be doing very well in regard to his medial and lateral malleolus ulcers. It has been a little over a month since I've last seen him secondary to family issues that he's had as well as issues with the coronavirus pandemic. Nonetheless he has continued to follow the directions for dressing changes in the interim since I last saw and and appears to be doing excellent. In fact his medial ulcer seems significantly smaller the lateral is also smaller but again it was a larger wound to begin with. Overall I do feel like he's making good progress however. 01/13/19 on evaluation today patient actually appears to be doing very well in regard to his ankle ulcers. The medial seems to be almost completely healed. The lateral is doing much better even compared to last time I saw him. I'm very pleased overall with how things seem to be progressing. The patient is likewise extremely happy. Electronic Signature(s) Signed: 01/13/2019 11:00:46 PM By: Lenda Kelp PA-C Entered By: Lenda Kelp on 01/13/2019 09:01:40 [name] ([id number]) -------------------------------------------------------------------------------- Physical Exam Details Patient Name: [name] Date of Service: 01/13/2019 8:00 AM Medical Record Number: [id number] Patient Account Number: [id number] Date of Birth/Sex: [birth year] (73 y.o. M) Treating RN: Arnette Norris Primary Care Provider: [name] Other Clinician: Referring Provider: [name] Treating Provider/Extender: STONE III,  HOYT Weeks in Treatment: 156 Constitutional Well-nourished and well-hydrated in no acute distress. Respiratory normal breathing without difficulty. clear to auscultation bilaterally. Cardiovascular regular rate and rhythm with normal S1, S2. Psychiatric this patient is able to make decisions and demonstrates good insight into disease process. Alert and Oriented x 3. pleasant and cooperative. Notes Patient's wound bed currently showed signs of good granulation at this time. Fortunately there does not appear to be any evidence of active infection at this point. No fevers, chills, nausea, or vomiting noted at this time. Electronic Signature(s) Signed: 01/13/2019 11:00:46 PM By: Lenda Kelp PA-C Entered By: Lenda Kelp on 01/13/2019 09:02:09 [name] ([id number]) -------------------------------------------------------------------------------- Physician Orders Details Patient Name: [name] Date of Service: 01/13/2019 8:00 AM Medical Record Number: [id number]  Patient Account Number: [id number] Date of Birth/Sex: Jan 23, 1946 (73 y.o. 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 o May Shower, gently pat wound dry prior to applying new dressing. - wash with hibiclens daily and leave on 5 mins Wound #2  Right,Medial Malleolus o Clean wound with Normal Saline. o May Shower, gently pat wound dry prior to applying new dressing. - wash with hibiclens and leave on 5 mins Anesthetic (add to Medication List) Wound #1 Right,Lateral Malleolus o Topical Lidocaine 4% cream applied to wound bed prior to debridement (In Clinic Only). Wound #2 Right,Medial Malleolus o Topical Lidocaine 4% cream applied to wound bed prior to debridement (In Clinic Only). Primary Wound Dressing Wound #1 Right,Lateral Malleolus o Hydrafera Blue Ready Transfer o Other: - Gentamicin cream to wound bed Wound #2 Right,Medial Malleolus o Hydrafera Blue Ready Transfer o Other: - Gentamicin cream to wound bed Secondary Dressing Wound #1 Right,Lateral Malleolus o Dry Gauze o Conform/Kerlix Wound #2 Right,Medial Malleolus o Dry Gauze o Conform/Kerlix Dressing Change Frequency Wound #1 Right,Lateral Malleolus o Change dressing every other day. Wound #2 Right,Medial Malleolus o Change dressing every other day. Follow-up Appointments Wound #1 Right,Lateral Malleolus QUINTERIOUS, WALRAVEN. ([id number]) o Return Appointment in 2 weeks. Wound #2 Right,Medial Malleolus o Return Appointment in 2 weeks. Edema Control Wound #1 Right,Lateral Malleolus o Elevate legs to the level of the heart and pump ankles as often as possible Wound #2 Right,Medial Malleolus o Elevate legs to the level of the heart and pump ankles as often as possible Additional Orders / Instructions Wound #1 Right,Lateral Malleolus o Increase protein intake. Wound #2 Right,Medial Malleolus o Increase protein intake. Electronic Signature(s) Signed: 01/13/2019 4:39:00 PM By: Arnette Norris Signed: 01/13/2019 11:00:46 PM By: Lenda Kelp PA-C Entered By: Arnette Norris on 01/13/2019 08:33:51 [name] ([id number]) -------------------------------------------------------------------------------- Problem  List Details Patient Name: [name] Date of Service: 01/13/2019 8:00 AM Medical Record Number: [id number] Patient Account Number: [id number] Date of Birth/Sex: 10/04/1945 (72 y.o. 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 M) Treating RN: Arnette Norris Primary Care Provider: [name] Other Clinician: Referring Provider: [name] Treating Provider/Extender: Linwood Dibbles, HOYT Weeks  in Treatment: 156 Subjective Chief Complaint Information obtained from Patient Mr. 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 having burning. Subsequently this never really improved and he eventually discontinue the use of the Iodoflex altogether. Unfortunately he did not let us know about any of this until today. I'm unsure of any specific infection at this point although I am going to obtain a wound culture today in order to see if there's anything that could potentially be causing an infection as well. It does sound as if he's had the Iodoflex before and did not have this kind of reaction although this does sound very specific for a reaction to the Iodoflex. 09/17/17 on evaluation today patient appears to be doing significantly better compared to last week's evaluation. At that time it actually appears that he did have an infection the good news is that we have been able  to get this under control with switching to the [facility] solution he's not having as much pain and discomfort he still does have some erythema surrounding the medial aspect wound. He has been tolerating the Dakin's soaked gauze dressing which is excellent and that his wounds do not seem Pare, Takeem C. ([id number]) to have as much adherent slough. Overall I'm pleased with the progress she's made in last week. 11/05/17 on evaluation today patient appears to be doing better in regard to his right medial and lateral malleolus ulcers. He has been tolerating the dressing changes without  complication. I do flight the Freada Bergeron is helping with additional granulation at this point in the wound beds do appear to be a little bit better. With that being said patient does not appear to have any evidence of significant infection at this point there is no erythema as he has previously noted he does note that the 30 day course of antibiotics I placed him on will end tomorrow. 11/12/17 on evaluation today patient actually appears to be doing excellent in regard to his right medial and lateral malleolus ulcers. He has been tolerating the dressing changes without complication. Fortunately he has no evidence of infection at this time and overall I believe he is progressing extremely nicely he has a lot of new epithelialization noted on evaluation today. Patient likewise is very pleased with the progress even just since last week. 11/19/17 on evaluation today patient appears to be doing about the same in regard to his ulcerations. He does not have any additional breakdown although he really has not noted any significant improvement either over the past week. With that being said the more concerning thing at this time is that he is beginning to show signs of erythema surrounding both wounds which is what typically happens and then he will develop into a more overt infection. This has been the course for some time. I hope that doing the 30 day in a biotic cycle this past time would break that so that he would not have the same type of thing happen again. Nonetheless it appears that is digressing back into this infected state unfortunately. With that being said he is not having any pain currently which is good he typically does start to hurt more as this progresses. Fortunately there does not appear to be any evidence of infection systemically which is good news. 11/26/17 on evaluation today patient appears to actually be doing rather well in regard to his ulcer compared to last week. He still has your  theme and noted around both the medial and lateral malleolus ulcers of the right lower extremity. He does not seem to be quite as overtly infected however. He has been tolerating the dressing changes without complication which is good news. The gentamicin cream does seem to have been of benefit for him. With that being said he does actually have an appointment with Dr. Sampson Goon this morning to see if there's anything that would be recommended in the way of IV antibiotic therapy. Otherwise he still has slough noted on the surface of the wound. 12/03/17 on evaluation today patient presents for follow-up concerning his ongoing right lower extremity ulcers. He has been tolerating the dressing changes without complication he states he has not really been using gentamicin on the lateral ankle and this seems to be doing very well. For that reason I think that is probably fine to put a hold on the gentamicin he states his burns and stings when he applies  it and we maybe be able to just use this intermittently when things become more irritated. Other than that the good news is I did review his MRI which was performed on 11/28/17 and revealed that he has a negative MRI for abscess, osteomyelitis, or septic joint. Obviously these were the issues that I was concerned with the possibility of and I'm pleased to find that there is no problems in that regard. I did also review Dr. Jarrett Ables note from infectious disease he discussed performed some lab work which apparently was done and then subsequently was going to consider the possibility of Keflex long-term for prevention of infection although this has not been prescribed as of yet. 12/09/17 on evaluation today patient appears to be doing a little bit worse in regard to his right medial and lateral malleolus ulcers. Fortunately the wound does not seem to be significantly worse although he does have a lot more drainage especially the lateral aspect he is having a  lot more pain than what he has noted more recently. Fortunately there does not appear to be any evidence of systemic infection which is good news. Again he has seen Dr. Sampson Goon but he has not been placed on the potential Keflex which was recommended as a possibility at least yet. 12/17/17 unfortunately on evaluation today the patient's wound appears to be doing much worse. He has been performing the dressing changes as directed. Upon a review of notes in epic it does appear that Dr. Jarrett Ables office specifically sending Keflex for the patient on the 16th which was last Tuesday. With that being said the patient states that though this was sent into [facility] that [facility] stated they never received it and subsequently the patient never took anything. He tells me he has been in bed since Friday due to the increased pain and overall general malaise. No fevers, chills, nausea, or vomiting noted at this time. 02/25/18 on evaluation today patient appears to be doing rather well in regard to his bilateral right malleolus ulcers. He has been tolerating the dressing changes without complication. Currently there does not appear to be any evidence of infection. Overall I'm very pleased with the progress that seems to been made up to this point. 03/11/18 on evaluation today patient actually appears to be doing very well in regard to his right ankle ulcers. He's been tolerating the dressing changes without complication. Fortunately there does not appear to be any evidence of sniffing infection I do believe that the gentamicin cream continues to be of benefit for him. Fortunately he is showing signs of healing in which time I see him. He also tells me he's having no pain. 03/25/18 on evaluation today patient actually appears to show signs of improvement especially in regard to the right lateral ankle ulcer. He has been tolerating the dressing changes without complication. In general I'm very pleased with the  progress that has been made up to this point. 04/08/18- He presents in follow up evaluation for right bimalleolar wounds; there is essentially no change. He admits to removing the dressing at night to allow the wounds to be "air out". We will continue with same treatment plan and he will follow up in two weeks [name], [name] 04/22/18 on evaluation today patient seems very frustrated with this situation currently as far as the ulcer on his right medial and lateral malleolus is concerned. He states this has been going on a very long time and obviously he does have a lot of bills as  well is far as wound care is concerned. With that being said he tells me that he's been tolerating the dressing changes well although he did clarify that what he is doing is putting the medicine on in the morning after shower and then subsequently he takes the dressing off at night leaving it open to air. I previously asked him about this and I was not aware that he was doing this. Nonetheless that may be slowing things down a bit at this point. Fortunately there does not appear to be any evidence of severe infection although he does have some evidence of your theme is surrounding the wound bed. He is still using the gentamicin cream. 05/06/18 on evaluation today patient actually presents for evaluation of his right medial malleolus ulcers. He's been tolerating the dressing changes without complication. With that being said it does appear that he is doing better compared to last evaluation. He's not having the pain like he did previous. He also states that blisters that he had coming up when I saw him last did resolve and ended up doing okay he did go to the ER they gave him some cream to put on it he's not sure what the name of the cream was. He was down in [facility] when he called me and I spoke with him previous. Nonetheless he states that they told him to put the medicine on his our due list. With  that being said I'm not really 100% sure that the medicine had anything to do with this due to the fact that again he was having the blisters when he came into the office which is why we actually put them on the anabiotic I was concerned about him having an infection which was causing the blistering and increased pain in his extremity. Nonetheless this is something that we can definitely be cautious of in the future it was Bactrim that we had him on. Overall I'm glad he is doing better. 05/20/18 on evaluation today patient actually appears to be doing fairly well in regard to his ulcer on the right lateral and medial malleolus locations. Both seem to be doing decently well he does have slough building up on the surface of the wound. He has been using the Hibiclense as I instructed him. Overall there's no evidence of significant infection which is good news. 06/02/18 on evaluation today patient actually appears to be doing excellent in regard to his right medial and lateral malleolus ulcers. He has been tolerating the dressing changes without complication. There does not appear to be any evidence of infection at this time which is great news. In general I feel like he has made great progress in the past several weeks. He has good granulation noted over areas that have not had good granulation previously and epithelialization even more so in several areas. Both wounds measured smaller. 06/17/18 an evaluation today patient actually appears to be doing excellent in regard to his right lateral and medial malleolus ulcers. He has been tolerating the dressing changes without complication fortunately the Hydrofera Blue Dressing to be doing excellent for him. He's actually making progress each time I see him. 07/08/18 on evaluation today patient's wounds on the right medial and lateral malleolus or region show signs of improvement currently. He has been tolerating the dressing changes without complication at  this time. He does feel like the [facility] Dressing has been of benefit for him. No fevers, chills, nausea, or vomiting noted at this time. 07/22/18  and evaluation today patient actually appears to be doing excellent in regard to his right medial and lateral malleolus ulcers. He's been tolerating the dressing changes without complication the Hydrofera Blue Dressing years be excellent for him. Overall I'm extremely pleased in this regard. 08/05/18 on evaluation today patient's right lateral and medial malleolus ulcers appear to be doing well at this point. There does not appear to be any evidence of infection which is good news. Overall he does have some Slough noted which is going to require sharp debridement but other than this I see no current issues 08/19/18 on evaluation today patient actually appears to be doing better yet again in regard to his lower Trinity ulcer on the right medial and lateral malleolus regions. He is having no pain and no significant signs of infection which is great news. Overall things seem to be progressing quite nicely which is great news. No fevers, chills, nausea, or vomiting noted at this time. 09/09/18 on evaluation today patient appears to be doing very well in regard to his medial and lateral ankle ulcers. He's been tolerating the dressing changes without complication. Fortunately there is no evidence of infection at this time which is good news. No fevers, chills, nausea, or vomiting noted at this time. 09/23/18 on evaluation today patient appears to be doing rather well in regard to his right medial and lateral ankle ulcers. He is been tolerating the dressing changes and still things continue to do excellent. Overall very pleased with the progress and there's no signs of infection today. 10/07/18 on evaluation today patient appears to be doing very well in regard to his right medial and lateral ankle ulcers. He's been tolerating the dressing changes without  complication. Fortunately there's no signs of infection at this time. Overall the patient states that he is very pleased with how things seem to be progressing. 10/21/18 on evaluation today patient actually appears to be doing very well in regard to his right medial and lateral malleolus ulcers. He's been tolerating the dressing changes without complication overall I'm very pleased with how things seem to be progressing. There's no signs of infection. SHAUNE, WESTFALL ([id number]) 11/04/18 on evaluation today patient actually appears to be feeling very well at this point. The medial wound bed actually show signs of improvement and looks to be doing very well. The lateral wing then actually is not want to touch with this show signs of increased drainage for some more Slough buildup. He's also having more pain which he states started last night. Nonetheless I'm concerned in this regard about the possibility of infection. No fevers, chills, nausea, or vomiting noted at this time. He has been since August 2019 since he last was pretty for infection. 12/16/18 on evaluation today patient appears to be doing very well in regard to his medial and lateral malleolus ulcers. It has been a little over a month since I've last seen him secondary to family issues that he's had as well as issues with the coronavirus pandemic. Nonetheless he has continued to follow the directions for dressing changes in the interim since I last saw and and appears to be doing excellent. In fact his medial ulcer seems significantly smaller the lateral is also smaller but again it was a larger wound to begin with. Overall I do feel like he's making good progress however. 01/13/19 on evaluation today patient actually appears to be doing very well in regard to his ankle ulcers. The medial seems to be almost completely healed.  The lateral is doing much better even compared to last time I saw him. I'm very pleased overall with how things  seem to be progressing. The patient is likewise extremely happy. Patient History Information obtained from Patient. Social History Former smoker - 10 years ago, Marital Status - Married, Alcohol Use - Moderate, Drug Use - No History, Caffeine Use - Never. Medical History Eyes Patient has history of Cataracts - removed Denies history of Glaucoma, Optic Neuritis Ear/Nose/Mouth/Throat Denies history of Chronic sinus problems/congestion Hematologic/Lymphatic Denies history of Anemia, Hemophilia, Human Immunodeficiency Virus, Lymphedema, Sickle Cell Disease Respiratory Patient has history of Chronic Obstructive Pulmonary Disease (COPD) Denies history of Aspiration, Asthma, Pneumothorax, Sleep Apnea, Tuberculosis Cardiovascular Denies history of Angina, Arrhythmia, Congestive Heart Failure, Coronary Artery Disease, Deep Vein Thrombosis, Hypertension, Hypotension, Myocardial Infarction, Peripheral Arterial Disease, Peripheral Venous Disease, Phlebitis, Vasculitis Gastrointestinal Denies history of Cirrhosis , Colitis, Crohn s, Hepatitis A, Hepatitis B, Hepatitis C Endocrine Denies history of Type I Diabetes, Type II Diabetes Genitourinary Denies history of End Stage Renal Disease Immunological Denies history of Lupus Erythematosus, Raynaud s Integumentary (Skin) Denies history of History of Burn, History of pressure wounds Musculoskeletal Patient has history of Osteoarthritis Denies history of Gout, Rheumatoid Arthritis, Osteomyelitis Neurologic Denies history of Dementia, Neuropathy, Quadriplegia, Paraplegia, Seizure Disorder Oncologic Denies history of Received Chemotherapy, Received Radiation Psychiatric Denies history of Anorexia/bulimia, Confinement Anxiety Medical And Surgical History Notes Constitutional Symptoms (General Health) Vein Filter (groin area); CODA; H/O Blood Clots; pulmonary hypertensive arterial disease Review of Systems (ROS) Constitutional Symptoms (General  Health) Denies complaints or symptoms of Fatigue, Fever, Chills, Marked Weight Change. Respiratory Denies complaints or symptoms of Chronic or frequent coughs, Shortness of Breath. ELVYN, KROHN ([id number]) Cardiovascular Denies complaints or symptoms of Chest pain, LE edema. Psychiatric Denies complaints or symptoms of Anxiety, Claustrophobia. Objective Constitutional Well-nourished and well-hydrated in no acute distress. Vitals Time Taken: 8:10 AM, Height: 76 in, Weight: 238 lbs, BMI: 29, Temperature: 98.4 F, Pulse: 75 bpm, Respiratory Rate: 18 breaths/min, Blood Pressure: 147/73 mmHg. Respiratory normal breathing without difficulty. clear to auscultation bilaterally. Cardiovascular regular rate and rhythm with normal S1, S2. Psychiatric this patient is able to make decisions and demonstrates good insight into disease process. Alert and Oriented x 3. pleasant and cooperative. General Notes: Patient's wound bed currently showed signs of good granulation at this time. Fortunately there does not appear to be any evidence of active infection at this point. No fevers, chills, nausea, or vomiting noted at this time. Integumentary (Hair, Skin) Wound #1 status is Open. Original cause of wound was Gradually Appeared. The wound is located on the Right,Lateral Malleolus. The wound measures 6cm length x 1.8cm width x 0.2cm depth; 8.482cm^2 area and 1.696cm^3 volume. There is Fat Layer (Subcutaneous Tissue) Exposed exposed. There is no tunneling or undermining noted. There is a medium amount of serous drainage noted. The wound margin is flat and intact. There is small (1-33%) pink granulation within the wound bed. There is a large (67-100%) amount of necrotic tissue within the wound bed including Adherent Slough. Wound #2 status is Open. Original cause of wound was Gradually Appeared. The wound is located on the Right,Medial Malleolus. The wound measures 0.5cm length x 0.4cm width x 0.1cm  depth; 0.157cm^2 area and 0.016cm^3 volume. There is Fat Layer (Subcutaneous Tissue) Exposed exposed. There is no tunneling or undermining noted. There is a medium amount of serous drainage noted. The wound margin is flat and intact. There is large (67-100%) red granulation within the  wound bed. There is a small (1-33%) amount of necrotic tissue within the wound bed including Adherent Slough. 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 Procedures Wound #1 Pre-procedure diagnosis of Wound #1 is a Venous Leg Ulcer located on the Right,Lateral Malleolus .Severity of Tissue Pre Debridement is: Fat layer exposed. There was a Excisional Skin/Subcutaneous Tissue Debridement with a total area of 10.8 sq cm performed by STONE III, HOYT E., PA-C. With the following instrument(s): Curette to remove Viable and Non-Viable tissue/material. Material removed includes Subcutaneous Tissue and Slough and after achieving pain control using Lidocaine. No specimens were taken. A time out was conducted at 08:28, prior to the start of the procedure. There was no bleeding. The procedure was tolerated well with a pain level of 1 throughout and a pain level of 1 following the procedure. Post Debridement Measurements: 6cm length x 1.8cm width x 0.2cm depth; 1.696cm^3 volume. Character of Wound/Ulcer Post Debridement is improved. Severity of Tissue Post Debridement is: Fat layer exposed. Post procedure Diagnosis Wound #1: Same as Pre-Procedure Plan Wound Cleansing: Wound #1 Right,Lateral Malleolus: Clean wound with Normal Saline. May Shower, gently pat wound dry prior to  applying new dressing. - wash with hibiclens daily and leave on 5 mins Wound #2 Right,Medial Malleolus: Clean wound with Normal Saline. May Shower, gently pat wound dry prior to applying new dressing. - wash with hibiclens and leave on 5 mins Anesthetic (add to Medication List): Wound #1 Right,Lateral Malleolus: Topical Lidocaine 4% cream applied to wound bed prior to debridement (In Clinic Only). Wound #2 Right,Medial Malleolus: Topical Lidocaine 4% cream applied to wound bed prior to debridement (In Clinic Only). Primary Wound Dressing: Wound #1 Right,Lateral Malleolus: Hydrafera Blue Ready Transfer Other: - Gentamicin cream to wound bed Wound #2 Right,Medial Malleolus: Hydrafera Blue Ready Transfer Other: - Gentamicin cream to wound bed Secondary Dressing: Wound #1 Right,Lateral Malleolus: Dry Gauze Conform/Kerlix Wound #2 Right,Medial Malleolus: Dry Gauze Conform/Kerlix Dressing Change Frequency: Wound #1 Right,Lateral Malleolus: Change dressing every other day. Wound #2 Right,Medial Malleolus: Change dressing every other day. Follow-up Appointments: Wound #1 Right,Lateral Malleolus: Return Appointment in 2 weeks. Wound #2 Right,Medial Malleolus: Return Appointment in 2 weeks. Edema Control: Wound #1 Right,Lateral Malleolus: Elevate legs to the level of the heart and pump ankles as often as possible Wound #2 Right,Medial Malleolus: Elevate legs to the level of the heart and pump ankles as often as possible Additional Orders / Instructions: Wound #1 Right,Lateral Malleolus: Increase protein intake. Wound #2 Right,Medial Malleolus: Increase protein intake. [name], [name] ([id number]) At this point my suggestion is gonna be that we continue with the above wound to measures since the patient seems to be doing so well he is in agreement that plan. We will subsequently see were things stand at follow-up. If anything changes or worsens meantime he will contact the  office and let me know. Please see above for specific wound care orders. We will see patient for re-evaluation in Please see above for specific wound care orders. We will see patient for re-evaluation in 2 week(s) here in the clinic. If anything worsens or changes patient will contact our office for additional recommendations. Electronic Signature(s) Signed: 01/13/2019 11:00:46 PM By: Lenda Kelp PA-C Entered By: Lenda Kelp  on 01/13/2019 09:12:48 SAQUAN, [name] ([id number]) -------------------------------------------------------------------------------- ROS/PFSH Details Patient Name: [name], [name]. Date of Service: 01/13/2019 8:00 AM Medical Record Number: [id number] Patient Account Number: [id number] Date of Birth/Sex: 16-May-1946 (73 y.o. M) Treating RN: Arnette Norris Primary Care Provider: [name] Other Clinician: Referring Provider: [name] Treating Provider/Extender: STONE III, HOYT Weeks in Treatment: 156 Information Obtained From Patient Constitutional Symptoms (General Health) Complaints and Symptoms: Negative for: Fatigue; Fever; Chills; Marked Weight Change Medical History: Past Medical History Notes: Vein Filter (groin area); CODA; H/O Blood Clots; pulmonary hypertensive arterial disease Respiratory Complaints and Symptoms: Negative for: Chronic or frequent coughs; Shortness of Breath Medical History: Positive for: Chronic Obstructive Pulmonary Disease (COPD) Negative for: Aspiration; Asthma; Pneumothorax; Sleep Apnea; Tuberculosis Cardiovascular Complaints and Symptoms: Negative for: Chest pain; LE edema Medical History: Negative for: Angina; Arrhythmia; Congestive Heart Failure; Coronary Artery Disease; Deep Vein Thrombosis; Hypertension; Hypotension; Myocardial Infarction; Peripheral Arterial Disease; Peripheral Venous Disease; Phlebitis; Vasculitis Psychiatric Complaints and Symptoms: Negative for: Anxiety; Claustrophobia Medical  History: Negative for: Anorexia/bulimia; Confinement Anxiety Eyes Medical History: Positive for: Cataracts - removed Negative for: Glaucoma; Optic Neuritis Ear/Nose/Mouth/Throat Medical History: Negative for: Chronic sinus problems/congestion Hematologic/Lymphatic Medical History: Negative for: Anemia; Hemophilia; Human Immunodeficiency Virus; Lymphedema; Sickle Cell Disease Gastrointestinal Medical History: Negative for: Cirrhosis ; Colitis; Crohnos; Hepatitis A; Hepatitis B; Hepatitis C Mcnall, Revere C. ([id number]) Endocrine Medical History: Negative for: Type I Diabetes; Type II Diabetes Genitourinary Medical History: Negative for: End Stage Renal Disease Immunological Medical History: Negative for: Lupus Erythematosus; Raynaudos Integumentary (Skin) Medical History: Negative for: History of Burn; History of pressure wounds Musculoskeletal Medical History: Positive for: Osteoarthritis Negative for: Gout; Rheumatoid Arthritis; Osteomyelitis Neurologic Medical History: Negative for: Dementia; Neuropathy; Quadriplegia; Paraplegia; Seizure Disorder Oncologic Medical History: Negative for: Received Chemotherapy; Received Radiation HBO Extended History Items Eyes: Cataracts Immunizations Pneumococcal Vaccine: Received Pneumococcal Vaccination: No Implantable Devices No devices added Family and Social History Former smoker - 10 years ago; Marital Status - Married; Alcohol Use: Moderate; Drug Use: No History; Caffeine Use: Never Physician Affirmation I have reviewed and agree with the above information. Electronic Signature(s) Signed: 01/13/2019 4:39:00 PM By: Arnette Norris Signed: 01/13/2019 11:00:46 PM By: Lenda Kelp PA-C Entered By: Lenda Kelp on 01/13/2019 09:01:56 [name] ([id number]) -------------------------------------------------------------------------------- SuperBill Details Patient Name: [name] Date of Service:  01/13/2019 Medical Record Number: [id number] Patient Account Number: [id number] Date of Birth/Sex: 05-12-1946 (73 y.o. 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, sequela T81.31XS Disruption of external operation (surgical) wound, not elsewhere classified, sequela Facility Procedures CPT4 Code: 64403474 Description: 11042 - DEB SUBQ TISSUE 20 SQ CM/< ICD-10 Diagnosis Description L97.312 Non-pressure chronic ulcer of right ankle with fat layer ex Modifier: posed Quantity: 1 Physician Procedures CPT4 Code: 2595638 Description: 11042 - WC PHYS SUBQ TISS 20 SQ CM ICD-10 Diagnosis Description L97.312 Non-pressure chronic ulcer of right ankle with fat layer ex Modifier: posed Quantity: 1 Electronic Signature(s) Signed: 01/13/2019 11:00:46 PM By: Lenda Kelp PA-C Entered By: Lenda Kelp on 01/13/2019 09:13:18

## 2019-01-14 NOTE — Progress Notes (Signed)
[name], Piotr C. ([id number]) Visit Report for 01/13/2019 Arrival Information Details Patient Name: [name], [name] Date of Service: 01/13/2019 8:00 AM Medical Record Number: [id number] Patient Account Number: [id number] Date of Birth/Sex: [birth year] [age] y.o. M) Treating RN: Arnette NorrisBiell, Kristina Primary Care Canda Podgorski: Darreld McleanMILES, LINDA Other Clinician: Referring Sivan Quast: Darreld McleanMILES, LINDA Treating Carroll Lingelbach/Extender: Linwood DibblesSTONE III, HOYT Weeks in Treatment: 156 Visit Information History Since Last Visit Added or deleted any medications: No Patient Arrived: Ambulatory Any new allergies or adverse reactions: No Arrival Time: 08:10 Had a fall or experienced change in No Accompanied By: self activities of daily living that may affect Transfer Assistance: None risk of falls: Patient Identification Verified: Yes Signs or symptoms of abuse/neglect since last visito No Secondary Verification Process Yes Hospitalized since last visit: No Completed: Has Dressing in Place as Prescribed: Yes Patient Requires Transmission-Based No Pain Present Now: No Precautions: Patient Has Alerts: Yes Patient Alerts: Patient on Blood Thinner Electronic Signature(s) Signed: 01/13/2019 4:39:00 PM By: Arnette NorrisBiell, Kristina Entered By: Arnette NorrisBiell, Kristina on 01/13/2019 08:12:15 [name], [name] ([id number]) -------------------------------------------------------------------------------- Lower Extremity Assessment Details Patient Name: [name], Avett C. Date of Service: 01/13/2019 8:00 AM Medical Record Number: [id number] Patient Account Number: [id number] Date of Birth/Sex: [birth year] [age] y.o. M) Treating RN: Arnette NorrisBiell, Kristina Primary Care Kaynan Klonowski: Darreld McleanMILES, LINDA Other Clinician: Referring Aava Deland: Darreld McleanMILES, LINDA Treating Layali Freund/Extender: STONE III, HOYT Weeks in Treatment: 156 Vascular Assessment Pulses: Dorsalis Pedis Palpable: [Right:Yes] Posterior Tibial Palpable: [Right:Yes] Electronic Signature(s) Signed:  01/13/2019 4:39:00 PM By: Arnette NorrisBiell, Kristina Entered By: Arnette NorrisBiell, Kristina on 01/13/2019 08:19:59 [name], [name] ([id number]) -------------------------------------------------------------------------------- Multi Wound Chart Details Patient Name: [name], [name] Date of Service: 01/13/2019 8:00 AM Medical Record Number: [id number] Patient Account Number: [id number] Date of Birth/Sex: [birth year] [age] y.o. M) Treating RN: Arnette NorrisBiell, Kristina Primary Care Donae Kueker: Darreld McleanMILES, LINDA Other Clinician: Referring Rashanda Magloire: Darreld McleanMILES, LINDA Treating Camala Talwar/Extender: STONE III, HOYT Weeks in Treatment: 156 Vital Signs Height(in): 76 Pulse(bpm): 75 Weight(lbs): 238 Blood Pressure(mmHg): 147/73 Body Mass Index(BMI): 29 Temperature(F): 98.4 Respiratory Rate 18 (breaths/min): Photos: [N/A:N/A] Wound Location: Right Malleolus - Lateral Right Malleolus - Medial N/A Wounding Event: Gradually Appeared Gradually Appeared N/A Primary Etiology: Venous Leg Ulcer Venous Leg Ulcer N/A Comorbid History: Cataracts, Chronic Obstructive Cataracts, Chronic Obstructive N/A Pulmonary Disease (COPD), Pulmonary Disease (COPD), Osteoarthritis Osteoarthritis Date Acquired: 08/11/2015 01/30/2016 N/A Weeks of Treatment: 156 154 N/A Wound Status: Open Open N/A Clustered Wound: No Yes N/A Measurements L x W x D 6x1.8x0.2 0.5x0.4x0.1 N/A (cm) Area (cm) : 8.482 0.157 N/A Volume (cm) : 1.696 0.016 N/A % Reduction in Area: 50.90% 98.60% N/A % Reduction in Volume: 67.30% 98.60% N/A Classification: Full Thickness Without Full Thickness Without N/A Exposed Support Structures Exposed Support Structures Exudate Amount: Medium Medium N/A Exudate Type: Serous Serous N/A Exudate Color: amber amber N/A Wound Margin: Flat and Intact Flat and Intact N/A Granulation Amount: Small (1-33%) Large (67-100%) N/A Granulation Quality: Pink Red N/A Necrotic Amount: Large (67-100%) Small (1-33%) N/A Exposed Structures: Fat Layer  (Subcutaneous Fat Layer (Subcutaneous N/A Tissue) Exposed: Yes Tissue) Exposed: Yes Fascia: No Fascia: No Tendon: No Tendon: No Muscle: No Muscle: No Joint: No Joint: No Bone: No Bone: No Epithelialization: Small (1-33%) None N/A Treatment Notes [name], [name] ([id number]) Electronic Signature(s) Signed: 01/13/2019 4:39:00 PM By: Arnette NorrisBiell, Kristina Entered By: Arnette NorrisBiell, Kristina on 01/13/2019 08:25:54 [name], [name] ([id number]) -------------------------------------------------------------------------------- Multi-Disciplinary Care Plan Details Patient Name: [name], [name] Date of Service: 01/13/2019 8:00 AM Medical Record Number: [id number] Patient Account Number: [id number] Date of Birth/Sex: [birth year] [age] y.o. M)  Treating RN: Arnette Norris Primary Care Cassi Jenne: [name] Other Clinician: Referring Tarl Cephas: [name] Treating Tag Wurtz/Extender: Linwood Dibbles, HOYT Weeks in Treatment: 156 Active Inactive Venous Leg Ulcer Nursing Diagnoses: Knowledge deficit related to disease process and management Goals: Patient will maintain optimal edema control Date Initiated: 04/03/2016 Target Resolution Date: 11/24/2016 Goal Status: Active Interventions: Compression as ordered Treatment Activities: Therapeutic compression applied : 04/03/2016 Notes: Wound/Skin Impairment Nursing Diagnoses: Impaired tissue integrity Goals: Ulcer/skin breakdown will heal within 14 weeks Date Initiated: 01/17/2016 Target Resolution Date: 11/24/2016 Goal Status: Active Interventions: Assess ulceration(s) every visit Notes: Electronic Signature(s) Signed: 01/13/2019 4:39:00 PM By: Arnette Norris Entered By: Arnette Norris on 01/13/2019 08:25:42 [name] ([id number]) -------------------------------------------------------------------------------- Pain Assessment Details Patient Name: [name] Date of Service: 01/13/2019 8:00 AM Medical Record Number:  [id number] Patient Account Number: [id number] Date of Birth/Sex: 08/26/1946 (73 y.o. M) Treating RN: Arnette Norris Primary Care Evalee Gerard: [name] Other Clinician: Referring Ginamarie Banfield: [name] Treating Syliva Mee/Extender: STONE III, HOYT Weeks in Treatment: 156 Active Problems Location of Pain Severity and Description of Pain Patient Has Paino No Site Locations Pain Management and Medication Current Pain Management: Electronic Signature(s) Signed: 01/13/2019 4:39:00 PM By: Arnette Norris Entered By: Arnette Norris on 01/13/2019 08:12:22 [name] ([id number]) -------------------------------------------------------------------------------- Patient/Caregiver Education Details Patient Name: [name] Date of Service: 01/13/2019 8:00 AM Medical Record Number: [id number] Patient Account Number: [id number] Date of Birth/Gender: 02/14/1946 (73 y.o. M) Treating RN: Arnette Norris Primary Care Physician: [name] Other Clinician: Referring Physician: [name] Treating Physician/Extender: Skeet Simmer in Treatment: 156 Education Assessment Education Provided To: Patient Education Topics Provided Wound/Skin Impairment: Handouts: Caring for Your Ulcer Methods: Demonstration, Explain/Verbal Responses: State content correctly Electronic Signature(s) Signed: 01/13/2019 4:39:00 PM By: Arnette Norris Entered By: Arnette Norris on 01/13/2019 49:70:26 [name] ([id number]) -------------------------------------------------------------------------------- Wound Assessment Details Patient Name: [name] Date of Service: 01/13/2019 8:00 AM Medical Record Number: [id number] Patient Account Number: [id number] Date of Birth/Sex: [birth year] (73 y.o. M) Treating RN: Arnette Norris Primary Care Eryn Krejci: [name] Other Clinician: Referring Donnielle Addison: [name] Treating Candee Hoon/Extender: STONE III, HOYT Weeks in Treatment:  156 Wound Status Wound Number: 1 Primary Venous Leg Ulcer Etiology: Wound Location: Right Malleolus - Lateral Wound Open Wounding Event: Gradually Appeared Status: Date Acquired: 08/11/2015 Comorbid Cataracts, Chronic Obstructive Pulmonary Weeks Of Treatment: 156 History: Disease (COPD), Osteoarthritis Clustered Wound: No Photos Wound Measurements Length: (cm) 6 Width: (cm) 1.8 Depth: (cm) 0.2 Area: (cm) 8.482 Volume: (cm) 1.696 % Reduction in Area: 50.9% % Reduction in Volume: 67.3% Epithelialization: Small (1-33%) Tunneling: No Undermining: No Wound Description Full Thickness Without Exposed Support Classification: Structures Wound Margin: Flat and Intact Exudate Medium Amount: Exudate Type: Serous Exudate Color: amber Foul Odor After Cleansing: No Slough/Fibrino Yes Wound Bed Granulation Amount: Small (1-33%) Exposed Structure Granulation Quality: Pink Fascia Exposed: No Necrotic Amount: Large (67-100%) Fat Layer (Subcutaneous Tissue) Exposed: Yes Necrotic Quality: Adherent Slough Tendon Exposed: No Muscle Exposed: No Joint Exposed: No Bone Exposed: No Electronic Signature(s) Signed: 01/13/2019 4:39:00 PM By: Arnette Norris Entered By: Arnette Norris on 01/13/2019 08:18:49 [name] ([id number]) -------------------------------------------------------------------------------- Wound Assessment Details Patient Name: [name] Date of Service: 01/13/2019 8:00 AM Medical Record Number: [id number] Patient Account Number: [id number] Date of Birth/Sex: Oct 24, 1945 (73 y.o. M) Treating RN: Arnette Norris Primary Care Derrell Milanes: [name] Other Clinician: Referring Aerith Canal: [name] Treating Syncere Eble/Extender: STONE III, HOYT Weeks in Treatment: 156 Wound Status Wound Number: 2 Primary Venous Leg Ulcer Etiology:  Wound Location: Right Malleolus - Medial Wound Open Wounding Event: Gradually Appeared Status: Date Acquired:  01/30/2016 Comorbid Cataracts, Chronic Obstructive Pulmonary Weeks Of Treatment: 154 History: Disease (COPD), Osteoarthritis Clustered Wound: Yes Photos Wound Measurements Length: (cm) 0.5 Width: (cm) 0.4 Depth: (cm) 0.1 Area: (cm) 0.157 Volume: (cm) 0.016 % Reduction in Area: 98.6% % Reduction in Volume: 98.6% Epithelialization: None Tunneling: No Undermining: No Wound Description Full Thickness Without Exposed Support Classification: Structures Wound Margin: Flat and Intact Exudate Medium Amount: Exudate Type: Serous Exudate Color: amber Foul Odor After Cleansing: No Slough/Fibrino Yes Wound Bed Granulation Amount: Large (67-100%) Exposed Structure Granulation Quality: Red Fascia Exposed: No Necrotic Amount: Small (1-33%) Fat Layer (Subcutaneous Tissue) Exposed: Yes Necrotic Quality: Adherent Slough Tendon Exposed: No Muscle Exposed: No Joint Exposed: No Bone Exposed: No Electronic Signature(s) Signed: 01/13/2019 4:39:00 PM By: Arnette Norris Entered By: Arnette Norris on 01/13/2019 08:19:27 [name] ([id number]) -------------------------------------------------------------------------------- Vitals Details Patient Name: [name] Date of Service: 01/13/2019 8:00 AM Medical Record Number: [id number] Patient Account Number: [id number] Date of Birth/Sex: July 05, 1946 (73 y.o. M) Treating RN: Arnette Norris Primary Care Elli Groesbeck: [name] Other Clinician: Referring Maddox Hlavaty: [name] Treating Razi Hickle/Extender: STONE III, HOYT Weeks in Treatment: 156 Vital Signs Time Taken: 08:10 Temperature (F): 98.4 Height (in): 76 Pulse (bpm): 75 Weight (lbs): 238 Respiratory Rate (breaths/min): 18 Body Mass Index (BMI): 29 Blood Pressure (mmHg): 147/73 Reference Range: 80 - 120 mg / dl Electronic Signature(s) Signed: 01/13/2019 4:39:00 PM By: Arnette Norris Entered By: Arnette Norris on 01/13/2019 08:12:35

## 2019-01-27 ENCOUNTER — Encounter: Payer: Medicare HMO | Attending: Physician Assistant | Admitting: Physician Assistant

## 2019-01-27 ENCOUNTER — Other Ambulatory Visit: Payer: Self-pay

## 2019-01-27 DIAGNOSIS — L97312 Non-pressure chronic ulcer of right ankle with fat layer exposed: Secondary | ICD-10-CM | POA: Insufficient documentation

## 2019-01-27 DIAGNOSIS — J449 Chronic obstructive pulmonary disease, unspecified: Secondary | ICD-10-CM | POA: Diagnosis not present

## 2019-01-27 DIAGNOSIS — M1990 Unspecified osteoarthritis, unspecified site: Secondary | ICD-10-CM | POA: Diagnosis not present

## 2019-01-27 DIAGNOSIS — I87311 Chronic venous hypertension (idiopathic) with ulcer of right lower extremity: Secondary | ICD-10-CM | POA: Diagnosis present

## 2019-01-28 NOTE — Progress Notes (Signed)
METIN, OSWALD ([id number]) Visit Report for 01/27/2019 Arrival Information Details Patient Name: [name], [name]. Date of Service: 01/27/2019 8:00 AM Medical Record Number: [id number] Patient Account Number: [id number] Date of Birth/Sex: [birth year] (73 y.o. M) Treating RN: Huel Coventry Primary Care Sedonia Kitner: [name] Other Clinician: Referring Shamanda Len: [name] Treating Tanvir Hipple/Extender: Linwood Dibbles, HOYT Weeks in Treatment: 158 Visit Information History Since Last Visit Added or deleted any medications: No Patient Arrived: Ambulatory Any new allergies or adverse reactions: No Arrival Time: 08:14 Had a fall or experienced change in No Accompanied By: self activities of daily living that may affect Transfer Assistance: None risk of falls: Patient Identification Verified: Yes Signs or symptoms of abuse/neglect since last visito No Secondary Verification Process Yes Hospitalized since last visit: No Completed: Implantable device outside of the clinic excluding No Patient Requires Transmission-Based No cellular tissue based products placed in the center Precautions: since last visit: Patient Has Alerts: Yes Has Dressing in Place as Prescribed: Yes Patient Alerts: Patient on Blood Pain Present Now: No Thinner Electronic Signature(s) Signed: 01/27/2019 5:54:26 PM By: Elliot Gurney, BSN, RN, CWS, Kim RN, BSN Entered By: Elliot Gurney, BSN, RN, CWS, Kim on 01/27/2019 08:14:44 [name] ([id number]) -------------------------------------------------------------------------------- Encounter Discharge Information Details Patient Name: [name] Date of Service: 01/27/2019 8:00 AM Medical Record Number: [id number] Patient Account Number: [id number] Date of Birth/Sex: [birth year] (73 y.o. M) Treating RN: Huel Coventry Primary Care Kacie Huxtable: [name] Other Clinician: Referring Tyreka Henneke: [name] Treating Kaine Mcquillen/Extender: Linwood Dibbles, HOYT Weeks in Treatment:  158 Encounter Discharge Information Items Post Procedure Vitals Discharge Condition: Stable Temperature (F): 98 Ambulatory Status: Ambulatory Pulse (bpm): 71 Discharge Destination: Home Respiratory Rate (breaths/min): 16 Transportation: Private Auto Blood Pressure (mmHg): 145/62 Accompanied By: self Schedule Follow-up Appointment: No Clinical Summary of Care: Electronic Signature(s) Signed: 01/27/2019 5:54:26 PM By: Elliot Gurney, BSN, RN, CWS, Kim RN, BSN Entered By: Elliot Gurney, BSN, RN, CWS, Kim on 01/27/2019 08:44:37 [name] ([id number]) -------------------------------------------------------------------------------- Lower Extremity Assessment Details Patient Name: [name] Date of Service: 01/27/2019 8:00 AM Medical Record Number: [id number] Patient Account Number: [id number] Date of Birth/Sex: Jan 08, 1946 (73 y.o. M) Treating RN: Huel Coventry Primary Care Azzure Garabedian: [name] Other Clinician: Referring Rochester Serpe: [name] Treating Cachet Mccutchen/Extender: Lenda Kelp Weeks in Treatment: 158 Vascular Assessment Pulses: Dorsalis Pedis Palpable: [Right:Yes] Electronic Signature(s) Signed: 01/27/2019 5:54:26 PM By: Elliot Gurney, BSN, RN, CWS, Kim RN, BSN Entered By: Elliot Gurney, BSN, RN, CWS, Kim on 01/27/2019 08:22:33 [name] ([id number]) -------------------------------------------------------------------------------- Multi Wound Chart Details Patient Name: [name] Date of Service: 01/27/2019 8:00 AM Medical Record Number: [id number] Patient Account Number: [id number] Date of Birth/Sex: [birth year] (73 y.o. M) Treating RN: Huel Coventry Primary Care Anndrea Mihelich: [name] Other Clinician: Referring Janete Quilling: [name] Treating Jayant Kriz/Extender: Linwood Dibbles, HOYT Weeks in Treatment: 158 Vital Signs Height(in): 76 Pulse(bpm): 71 Weight(lbs): 238 Blood Pressure(mmHg): 145/62 Body Mass Index(BMI): 29 Temperature(F): 98.2 Respiratory  Rate 16 (breaths/min): Photos: [N/A:N/A] Wound Location: Right Malleolus - Lateral Right Malleolus - Medial N/A Wounding Event: Gradually Appeared Gradually Appeared N/A Primary Etiology: Venous Leg Ulcer Venous Leg Ulcer N/A Comorbid History: Cataracts, Chronic Obstructive Cataracts, Chronic Obstructive N/A Pulmonary Disease (COPD), Pulmonary Disease (COPD), Osteoarthritis Osteoarthritis Date Acquired: 08/11/2015 01/30/2016 N/A Weeks of Treatment: 158 156 N/A Wound Status: Open Open N/A Clustered Wound: No Yes N/A Measurements L x W x D 4.5x2.5x0.2 1x0.7x0.1 N/A (cm) Area (cm) : 8.836 0.55 N/A Volume (cm) : 1.767 0.055 N/A % Reduction in Area: 48.90% 95.20% N/A % Reduction in  Volume: 65.90% 95.20% N/A Classification: Full Thickness Without Full Thickness Without N/A Exposed Support Structures Exposed Support Structures Exudate Amount: Medium Medium N/A Exudate Type: Serous Serous N/A Exudate Color: amber amber N/A Wound Margin: Flat and Intact Flat and Intact N/A Granulation Amount: Small (1-33%) Medium (34-66%) N/A Granulation Quality: Pink Pink N/A Necrotic Amount: Large (67-100%) Medium (34-66%) N/A Exposed Structures: Fat Layer (Subcutaneous Fat Layer (Subcutaneous N/A Tissue) Exposed: Yes Tissue) Exposed: Yes Fascia: No Fascia: No Tendon: No Tendon: No Mi, Fabio C. ([id number]) Muscle: No Muscle: No Joint: No Joint: No Bone: No Bone: No Epithelialization: Small (1-33%) Small (1-33%) N/A Treatment Notes Electronic Signature(s) Signed: 01/27/2019 5:54:26 PM By: Elliot Gurney, BSN, RN, CWS, Kim RN, BSN Entered By: Elliot Gurney, BSN, RN, CWS, Kim on 01/27/2019 08:27:42 [name] ([id number]) -------------------------------------------------------------------------------- Multi-Disciplinary Care Plan Details Patient Name: [name] Date of Service: 01/27/2019 8:00 AM Medical Record Number: [id number] Patient Account Number: [id number] Date of  Birth/Sex: Feb 18, 1946 (73 y.o. M) Treating RN: Huel Coventry Primary Care Semir Brill: [name] Other Clinician: Referring Jonahtan Manseau: [name] Treating Charlotta Lapaglia/Extender: Linwood Dibbles, HOYT Weeks in Treatment: 158 Active Inactive Venous Leg Ulcer Nursing Diagnoses: Knowledge deficit related to disease process and management Goals: Patient will maintain optimal edema control Date Initiated: 04/03/2016 Target Resolution Date: 11/24/2016 Goal Status: Active Interventions: Compression as ordered Treatment Activities: Therapeutic compression applied : 04/03/2016 Notes: Wound/Skin Impairment Nursing Diagnoses: Impaired tissue integrity Goals: Ulcer/skin breakdown will heal within 14 weeks Date Initiated: 01/17/2016 Target Resolution Date: 11/24/2016 Goal Status: Active Interventions: Assess ulceration(s) every visit Notes: Electronic Signature(s) Signed: 01/27/2019 5:54:26 PM By: Elliot Gurney, BSN, RN, CWS, Kim RN, BSN Entered By: Elliot Gurney, BSN, RN, CWS, Kim on 01/27/2019 08:27:34 [name] ([id number]) -------------------------------------------------------------------------------- Pain Assessment Details Patient Name: [name] Date of Service: 01/27/2019 8:00 AM Medical Record Number: [id number] Patient Account Number: [id number] Date of Birth/Sex: 02-27-1946 (73 y.o. M) Treating RN: Huel Coventry Primary Care Laraina Sulton: [name] Other Clinician: Referring Naturi Alarid: [name] Treating Seaira Byus/Extender: Linwood Dibbles, HOYT Weeks in Treatment: 158 Active Problems Location of Pain Severity and Description of Pain Patient Has Paino No Site Locations Pain Management and Medication Current Pain Management: Notes patient denies pain at this time. Electronic Signature(s) Signed: 01/27/2019 5:54:26 PM By: Elliot Gurney, BSN, RN, CWS, Kim RN, BSN Entered By: Elliot Gurney, BSN, RN, CWS, Kim on 01/27/2019 08:15:04 [name]  ([id number]) -------------------------------------------------------------------------------- Patient/Caregiver Education Details Patient Name: [name] Date of Service: 01/27/2019 8:00 AM Medical Record Number: [id number] Patient Account Number: [id number] Date of Birth/Gender: 06-Apr-1946 (73 y.o. M) Treating RN: Huel Coventry Primary Care Physician: [name] Other Clinician: Referring Physician: [name] Treating Physician/Extender: Skeet Simmer in Treatment: 158 Education Assessment Education Provided To: Patient Education Topics Provided Wound/Skin Impairment: Handouts: Caring for Your Ulcer Methods: Demonstration, Explain/Verbal Responses: State content correctly Electronic Signature(s) Signed: 01/27/2019 5:54:26 PM By: Elliot Gurney, BSN, RN, CWS, Kim RN, BSN Entered By: Elliot Gurney, BSN, RN, CWS, Kim on 01/27/2019 08:40:58 [name] ([id number]) -------------------------------------------------------------------------------- Wound Assessment Details Patient Name: [name] Date of Service: 01/27/2019 8:00 AM Medical Record Number: [id number] Patient Account Number: [id number] Date of Birth/Sex: 26-Mar-1946 (73 y.o. M) Treating RN: Huel Coventry Primary Care Kaidence Sant: [name] Other Clinician: Referring Leiam Hopwood: [name] Treating Jaben Benegas/Extender: STONE III, HOYT Weeks in Treatment: 158 Wound Status Wound Number: 1 Primary Venous Leg Ulcer Etiology: Wound Location: Right Malleolus - Lateral Wound Open Wounding Event: Gradually Appeared Status: Date Acquired: 08/11/2015 Comorbid Cataracts, Chronic Obstructive Pulmonary Weeks Of  Treatment: 158 History: Disease (COPD), Osteoarthritis Clustered Wound: No Photos Wound Measurements Length: (cm) 4.5 Width: (cm) 2.5 Depth: (cm) 0.2 Area: (cm) 8.836 Volume: (cm) 1.767 % Reduction in Area: 48.9% % Reduction in Volume: 65.9% Epithelialization: Small (1-33%) Tunneling:  No Undermining: No Wound Description Full Thickness Without Exposed Support Foul Odo Classification: Structures Slough/F Wound Margin: Flat and Intact Exudate Medium Amount: Exudate Type: Serous Exudate Color: amber r After Cleansing: No ibrino Yes Wound Bed Granulation Amount: Small (1-33%) Exposed Structure Granulation Quality: Pink Fascia Exposed: No Necrotic Amount: Large (67-100%) Fat Layer (Subcutaneous Tissue) Exposed: Yes Necrotic Quality: Adherent Slough Tendon Exposed: No Muscle Exposed: No Joint Exposed: No Bone Exposed: No [name], [name] ([id number]) Treatment Notes Wound #1 (Right, Lateral Malleolus) 1. Cleansed with: Clean wound with Normal Saline 2. Anesthetic Topical Lidocaine 4% cream to wound bed prior to debridement 4. Dressing Applied: Hydrafera Blue 5. Secondary Dressing Applied Kerlix/Conform Notes Gentamycin applies to wound bed, covered with Hydrofera Blue Ready Transfer, secured with conform and tape. Electronic Signature(s) Signed: 01/27/2019 5:54:26 PM By: Elliot GurneyWoody, BSN, RN, CWS, Kim RN, BSN Entered By: Elliot GurneyWoody, BSN, RN, CWS, Kim on 01/27/2019 08:20:44 [name], Tammy C. ([id number]) -------------------------------------------------------------------------------- Wound Assessment Details Patient Name: [name], [name] Date of Service: 01/27/2019 8:00 AM Medical Record Number: [id number] Patient Account Number: [id number] Date of Birth/Sex: 10-30-1945 [age] y.o. M) Treating RN: Huel CoventryWoody, Kim Primary Care Karnell Vanderloop: Darreld McleanMILES, LINDA Other Clinician: Referring Katora Fini: Darreld McleanMILES, LINDA Treating Margalit Leece/Extender: STONE III, HOYT Weeks in Treatment: 158 Wound Status Wound Number: 2 Primary Venous Leg Ulcer Etiology: Wound Location: Right Malleolus - Medial Wound Open Wounding Event: Gradually Appeared Status: Date Acquired: 01/30/2016 Comorbid Cataracts, Chronic Obstructive Pulmonary Weeks Of Treatment: 156 History: Disease (COPD),  Osteoarthritis Clustered Wound: Yes Photos Wound Measurements Length: (cm) 1 Width: (cm) 0.7 Depth: (cm) 0.1 Area: (cm) 0.55 Volume: (cm) 0.055 % Reduction in Area: 95.2% % Reduction in Volume: 95.2% Epithelialization: Small (1-33%) Tunneling: No Undermining: No Wound Description Full Thickness Without Exposed Support Foul Odo Classification: Structures Slough/F Wound Margin: Flat and Intact Exudate Medium Amount: Exudate Type: Serous Exudate Color: amber r After Cleansing: No ibrino Yes Wound Bed Granulation Amount: Medium (34-66%) Exposed Structure Granulation Quality: Pink Fascia Exposed: No Necrotic Amount: Medium (34-66%) Fat Layer (Subcutaneous Tissue) Exposed: Yes Necrotic Quality: Adherent Slough Tendon Exposed: No Muscle Exposed: No Joint Exposed: No Bone Exposed: No [name], [name] ([id number]) Treatment Notes Wound #2 (Right, Medial Malleolus) 1. Cleansed with: Clean wound with Normal Saline 2. Anesthetic Topical Lidocaine 4% cream to wound bed prior to debridement 4. Dressing Applied: Hydrafera Blue 5. Secondary Dressing Applied Kerlix/Conform Notes Gentamycin applies to wound bed, covered with Hydrofera Blue Ready Transfer, secured with conform and tape. Electronic Signature(s) Signed: 01/27/2019 5:54:26 PM By: Elliot GurneyWoody, BSN, RN, CWS, Kim RN, BSN Entered By: Elliot GurneyWoody, BSN, RN, CWS, Kim on 01/27/2019 08:22:16 [name], [name] ([id number]) -------------------------------------------------------------------------------- Vitals Details Patient Name: [name], [name] Date of Service: 01/27/2019 8:00 AM Medical Record Number: [id number] Patient Account Number: [id number] Date of Birth/Sex: 10-30-1945 [age] y.o. M) Treating RN: Huel CoventryWoody, Kim Primary Care Deicy Rusk: Darreld McleanMILES, LINDA Other Clinician: Referring Beula Joyner: Darreld McleanMILES, LINDA Treating Soledad Budreau/Extender: STONE III, HOYT Weeks in Treatment: 158 Vital Signs Time Taken: 08:15 Temperature (F):  98.2 Height (in): 76 Pulse (bpm): 71 Weight (lbs): 238 Respiratory Rate (breaths/min): 16 Body Mass Index (BMI): 29 Blood Pressure (mmHg): 145/62 Reference Range: 80 - 120 mg / dl Electronic Signature(s) Signed: 01/27/2019 5:54:26 PM By: Elliot GurneyWoody, BSN, RN, CWS, Kim RN, BSN Entered  By: Elliot Gurney, BSN, RN, CWS, Kim on 01/27/2019 16:10:96

## 2019-01-30 NOTE — Progress Notes (Signed)
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 today. I've been using Mehta honey for 1 week and I'd like to try and do it for a second week this see if we can get some form of viable surface here. 05/07/17; patient arrives with copious amounts of drainage and some pain in the superior part of the wound. He has not been systemically unwell. He's been changing this daily at home 05/14/17; patient arrives today complaining of less drainage and less pain. Dimensions slightly better. Surface culture I did of this last week grew MSSA I put him on dicloxacillin for 10 days. Patient requesting a further prescription since he feels so much better. He did not obtain the [facility] for reasons that aren't clear, he has been using Hydrofera Blue 05/21/17; perhaps some less drainage and less pain. He is completing another week of doxycycline. Unfortunately there is no change in the wound measurements are appearance still tightly adherent necrotic surface material that is really defied treatment. We have been using Hydrofera Blue most recently. He did not manage to get Anasept. He was told it was prescription at [facility] 05/29/17; absolutely no improvement in these wound areas.  He had remote plastic surgery with who was done at [facility] in [place] surrounding this area. This was a traumatic wound with extensive plastic surgery/skin grafting at that time. I switched him to sorbact last week to see if he can do anything about the recurrent necrotic surface and drainage. This is largely been refractory to Iodoflex/Hydrofera Blue/alginates. I believe we tried Elby Showers and more recently Medi honey l however the drainage is really too excessive 06/04/17; patient went to see Dr. Ulice Bold. Per the patient she ordered him a compression stocking. Continued the Anasept gel and sorbact was already on. No major improvement in the condition of the wounds in fact the medial wound looks larger. Again per the patient she did not feel a skin graft or operative debridement was indicated The patient had previous venous ablation in February 2018. Last arterial studies were in December 2017 and were within normal limits 06/18/17;  patient arrives back in clinic today using Anasept gel with sorbact. He changes this every day is wearing his own compression stocking. The patient actually saw Dr. Leta Baptist of plastic surgery. I've reviewed her note. The appointment was on 05/30/17. The basic issue with here was that she did not feel that skin grafts for patients with underlying stasis and edema have a good long-term success rate. She also discuss skin substitutes which has been done here as well however I have not been able to get the surface of these wounds to something that I think would support skin substitutes. This is why he felt he might need an operative debridement more aggressively than I can do in this clinic Review of systems; is otherwise negative he feels well 07/02/17; patient has been using Anasept gel with Sorbact. He changes this every day scrubs out the wound beds. Arrives today with the wound bed looking some better. Easier debridement 07/16/17; patient has been using  Anasept gel was sorbact for about a month now. The necrotic service on his wound bed is better and the drainage is now down to minimal but we've not made any improvements in the epithelialization. Change to [name], [name] ([id number]) Santyl today. Surface of the wound is still not good enough to support skin substitutes 07/30/17 on evaluation today patient appears to be doing very well in regard to his right bimalleolus wounds. He has been tolerating the treatment with the Anasept gel and sorbact. However we were going to try and switch to Santyl to be more aggressive with these wounds. This was however cost prohibitive. Therefore we will likely go back to the sorbact. Nonetheless he is not having any significant discomfort he still is forming slough over the wound location but nothing too significant. The wounds do appear to be filling in nicely. 08/13/17; I have not seen this wound in about a month. There is not much change. The fact the area medially looks larger. He has been using sorbact and Anasept for over a month now without much effect. Arrives in clinic stating that he does not want the area debrided 08/28/17; patient arrives with the areas on his right medial and right lateral ankle worse. The wounds of expanded there is erythema and still drainage. There is no pain and no tenderness. We had been using Keracel AG clearly not doing well. 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 He states that even the evening that we began utilizing the Iodoflex he started having burning. Subsequently this never really improved and he eventually discontinue the use of the Iodoflex altogether. Unfortunately he did not let us know about any  of this until today. I'm unsure of any specific infection at this point although I am going to obtain a wound culture today in order to see if there's anything that could potentially be causing an infection as well. It does sound as if he's had the Iodoflex before and did not have this kind of reaction although this does sound very specific for a reaction to the Iodoflex. 09/17/17 on evaluation today patient appears to be doing significantly better compared to last week's evaluation. At that time it actually appears that he did have an infection the good  news is that we have been able to get this under control with switching to the [facility] solution he's not having as much pain and discomfort he still does have some erythema surrounding the medial aspect wound. He has been tolerating the Dakin's soaked gauze dressing which is excellent and that his wounds do not seem to have as much adherent slough. Overall I'm pleased with the progress she's made in last week. 11/05/17 on evaluation today patient appears to be doing better in regard to his right medial and lateral malleolus ulcers. He has been tolerating the dressing changes without complication. I do flight the Freada Bergeron is helping with additional granulation at this point in the wound beds do appear to be a little bit better. With that being said patient does not appear to have any evidence of significant infection at this point there is no erythema as he has previously noted he does note that the 30 day course of antibiotics I placed him on will end tomorrow. 11/12/17 on evaluation today patient actually appears to be doing excellent in regard to his right medial and lateral malleolus ulcers. He has been tolerating the dressing changes without complication. Fortunately he has no evidence of infection at this time and overall I believe he is progressing extremely nicely he has a lot of new epithelialization noted on evaluation today. Patient likewise is  very pleased with the progress even just since last week. 11/19/17 on evaluation today patient appears to be doing about the same in regard to his ulcerations. He does not have any additional breakdown although he really has not noted any significant improvement either over the past week. With that being said the more concerning thing at this time is that he is beginning to show signs of erythema surrounding both wounds which is what typically happens and then he will develop into a more overt infection. This has been the course for some time. I hope that doing the 30 day in a biotic cycle this past time would break that so that he would not have the same type of thing happen again. Nonetheless it appears that is digressing back into this infected state unfortunately. With that being said he is not having any pain currently which is good he typically does start to hurt more as this progresses. Fortunately there does not appear to be any evidence of infection systemically which is good news. 11/26/17 on evaluation today patient appears to actually be doing rather well in regard to his ulcer compared to last week. He still has your theme and noted around both the medial and lateral malleolus ulcers of the right lower extremity. He does not seem to be quite as overtly infected however. He has been tolerating the dressing changes without complication which is good news. The gentamicin cream does seem to have been of benefit for him. With that being said he does actually have an appointment with Dr. Sampson Goon this morning to see if there's anything that would be recommended in the way of IV antibiotic therapy. Otherwise he still has slough noted on the surface of the wound. 12/03/17 on evaluation today patient presents for follow-up concerning his ongoing right lower extremity ulcers. He has been tolerating the dressing changes without complication he states he has not really been using gentamicin on the  lateral ankle and this seems to be doing very well. For that reason I think that is probably fine to put a hold on the gentamicin he states his burns and stings  when he applies it and we maybe be able to just use this intermittently when things become more irritated. [name], [name] ([id number]) Other than that the good news is I did review his MRI which was performed on 11/28/17 and revealed that he has a negative MRI for abscess, osteomyelitis, or septic joint. Obviously these were the issues that I was concerned with the possibility of and I'm pleased to find that there is no problems in that regard. I did also review Dr. Jarrett Ables note from infectious disease he discussed performed some lab work which apparently was done and then subsequently was going to consider the possibility of Keflex long-term for prevention of infection although this has not been prescribed as of yet. 12/09/17 on evaluation today patient appears to be doing a little bit worse in regard to his right medial and lateral malleolus ulcers. Fortunately the wound does not seem to be significantly worse although he does have a lot more drainage especially the lateral aspect he is having a lot more pain than what he has noted more recently. Fortunately there does not appear to be any evidence of systemic infection which is good news. Again he has seen Dr. Sampson Goon but he has not been placed on the potential Keflex which was recommended as a possibility at least yet. 12/17/17 unfortunately on evaluation today the patient's wound appears to be doing much worse. He has been performing the dressing changes as directed. Upon a review of notes in epic it does appear that Dr. Jarrett Ables office specifically sending Keflex for the patient on the 16th which was last Tuesday. With that being said the patient states that though this was sent into [facility] that [facility] stated they never received it and subsequently the patient never took  anything. He tells me he has been in bed since Friday due to the increased pain and overall general malaise. No fevers, chills, nausea, or vomiting noted at this time. 02/25/18 on evaluation today patient appears to be doing rather well in regard to his bilateral right malleolus ulcers. He has been tolerating the dressing changes without complication. Currently there does not appear to be any evidence of infection. Overall I'm very pleased with the progress that seems to been made up to this point. 03/11/18 on evaluation today patient actually appears to be doing very well in regard to his right ankle ulcers. He's been tolerating the dressing changes without complication. Fortunately there does not appear to be any evidence of sniffing infection I do believe that the gentamicin cream continues to be of benefit for him. Fortunately he is showing signs of healing in which time I see him. He also tells me he's having no pain. 03/25/18 on evaluation today patient actually appears to show signs of improvement especially in regard to the right lateral ankle ulcer. He has been tolerating the dressing changes without complication. In general I'm very pleased with the progress that has been made up to this point. 04/08/18- He presents in follow up evaluation for right bimalleolar wounds; there is essentially no change. He admits to removing the dressing at night to allow the wounds to be "air out". We will continue with same treatment plan and he will follow up in two weeks 04/22/18 on evaluation today patient seems very frustrated with this situation currently as far as the ulcer on his right medial and lateral malleolus is concerned. He states this has been going on a very long time and obviously he does have a lot of  bills as well is far as wound care is concerned. With that being said he tells me that he's been tolerating the dressing changes well although he did clarify that what he is doing is putting the  medicine on in the morning after shower and then subsequently he takes the dressing off at night leaving it open to air. I previously asked him about this and I was not aware that he was doing this. Nonetheless that may be slowing things down a bit at this point. Fortunately there does not appear to be any evidence of severe infection although he does have some evidence of your theme is surrounding the wound bed. He is still using the gentamicin cream. 05/06/18 on evaluation today patient actually presents for evaluation of his right medial malleolus ulcers. He's been tolerating the dressing changes without complication. With that being said it does appear that he is doing better compared to last evaluation. He's not having the pain like he did previous. He also states that blisters that he had coming up when I saw him last did resolve and ended up doing okay he did go to the ER they gave him some cream to put on it he's not sure what the name of the cream was. He was down in [facility] when he called me and I spoke with him previous. Nonetheless he states that they told him to put the medicine on his our due list. With that being said I'm not really 100% sure that the medicine had anything to do with this due to the fact that again he was having the blisters when he came into the office which is why we actually put them on the anabiotic I was concerned about him having an infection which was causing the blistering and increased pain in his extremity. Nonetheless this is something that we can definitely be cautious of in the future it was Bactrim that we had him on. Overall I'm glad he is doing better. 05/20/18 on evaluation today patient actually appears to be doing fairly well in regard to his ulcer on the right lateral and medial malleolus locations. Both seem to be doing decently well he does have slough building up on the surface of the wound. He has been using the Hibiclense as I instructed  him. Overall there's no evidence of significant infection which is good news. [name], [name] ([id number]) 06/02/18 on evaluation today patient actually appears to be doing excellent in regard to his right medial and lateral malleolus ulcers. He has been tolerating the dressing changes without complication. There does not appear to be any evidence of infection at this time which is great news. In general I feel like he has made great progress in the past several weeks. He has good granulation noted over areas that have not had good granulation previously and epithelialization even more so in several areas. Both wounds measured smaller. 06/17/18 an evaluation today patient actually appears to be doing excellent in regard to his right lateral and medial malleolus ulcers. He has been tolerating the dressing changes without complication fortunately the Hydrofera Blue Dressing to be doing excellent for him. He's actually making progress each time I see him. 07/08/18 on evaluation today patient's wounds on the right medial and lateral malleolus or region show signs of improvement currently. He has been tolerating the dressing changes without complication at this time. He does feel like the [facility] Dressing has been of benefit for him. No fevers, chills, nausea, or  vomiting noted at this time. 07/22/18 and evaluation today patient actually appears to be doing excellent in regard to his right medial and lateral malleolus ulcers. He's been tolerating the dressing changes without complication the Hydrofera Blue Dressing years be excellent for him. Overall I'm extremely pleased in this regard. 08/05/18 on evaluation today patient's right lateral and medial malleolus ulcers appear to be doing well at this point. There does not appear to be any evidence of infection which is good news. Overall he does have some Slough noted which is going to require sharp debridement but other than this I see no  current issues 08/19/18 on evaluation today patient actually appears to be doing better yet again in regard to his lower Trinity ulcer on the right medial and lateral malleolus regions. He is having no pain and no significant signs of infection which is great news. Overall things seem to be progressing quite nicely which is great news. No fevers, chills, nausea, or vomiting noted at this time. 09/09/18 on evaluation today patient appears to be doing very well in regard to his medial and lateral ankle ulcers. He's been tolerating the dressing changes without complication. Fortunately there is no evidence of infection at this time which is good news. No fevers, chills, nausea, or vomiting noted at this time. 09/23/18 on evaluation today patient appears to be doing rather well in regard to his right medial and lateral ankle ulcers. He is been tolerating the dressing changes and still things continue to do excellent. Overall very pleased with the progress and there's no signs of infection today. 10/07/18 on evaluation today patient appears to be doing very well in regard to his right medial and lateral ankle ulcers. He's been tolerating the dressing changes without complication. Fortunately there's no signs of infection at this time. Overall the patient states that he is very pleased with how things seem to be progressing. 10/21/18 on evaluation today patient actually appears to be doing very well in regard to his right medial and lateral malleolus ulcers. He's been tolerating the dressing changes without complication overall I'm very pleased with how things seem to be progressing. There's no signs of infection. 11/04/18 on evaluation today patient actually appears to be feeling very well at this point. The medial wound bed actually show signs of improvement and looks to be doing very well. The lateral wing then actually is not want to touch with this show signs of increased drainage for some more Slough  buildup. He's also having more pain which he states started last night. Nonetheless I'm concerned in this regard about the possibility of infection. No fevers, chills, nausea, or vomiting noted at this time. He has been since August 2019 since he last was pretty for infection. 12/16/18 on evaluation today patient appears to be doing very well in regard to his medial and lateral malleolus ulcers. It has been a little over a month since I've last seen him secondary to family issues that he's had as well as issues with the coronavirus pandemic. Nonetheless he has continued to follow the directions for dressing changes in the interim since I last saw and and appears to be doing excellent. In fact his medial ulcer seems significantly smaller the lateral is also smaller but again it was a larger wound to begin with. Overall I do feel like he's making good progress however. 01/13/19 on evaluation today patient actually appears to be doing very well in regard to his ankle ulcers. The medial seems to be  almost completely healed. The lateral is doing much better even compared to last time I saw him. I'm very pleased overall with how things seem to be progressing. The patient is likewise extremely happy. 01/27/19 on evaluation today patient appears to be doing very well in regard to his lower extremity ulcers. He is been tolerating Menendez, Karver C. ([id number]) the dressing changes without complication. Fortunately he seems to be making good progress. Overall been very pleased over the past several visits with the way the wound seem to be closing in quite nicely. There's no sign of active infection at this time. Electronic Signature(s) Signed: 01/28/2019 2:50:08 AM By: Lenda Kelp PA-C Entered By: Lenda Kelp on 01/28/2019 00:02:05 [name] ([id number]) -------------------------------------------------------------------------------- Physical Exam Details Patient Name: [name] Date of Service: 01/27/2019 8:00 AM Medical Record Number: [id number] Patient Account Number: [id number] Date of Birth/Sex: 04/07/1946 (73 y.o. M) Treating RN: Curtis Sites Primary Care Provider: [name] Other Clinician: Referring Provider: [name] Treating Provider/Extender: STONE III, HOYT Weeks in Treatment: 158 Constitutional Well-nourished and well-hydrated in no acute distress. Respiratory normal breathing without difficulty. clear to auscultation bilaterally. Cardiovascular regular rate and rhythm with normal S1, S2. Psychiatric this patient is able to make decisions and demonstrates good insight into disease process. Alert and Oriented x 3. pleasant and cooperative. Notes Patient's wound bed currently showed signs of good granulation at those locations. With some Slough noted that part sharp debridement of both sites as well. He tolerated the debridement well today without complication post debridement wound bed appears to be doing much better which is excellent news. Electronic Signature(s) Signed: 01/28/2019 2:50:08 AM By: Lenda Kelp PA-C Entered By: Lenda Kelp on 01/28/2019 00:02:52 [name] ([id number]) -------------------------------------------------------------------------------- Physician Orders Details Patient Name: [name] Date of Service: 01/27/2019 8:00 AM Medical Record Number: [id number] Patient Account Number: [id number] Date of Birth/Sex: 1946/04/07 (73 y.o. 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 o May Shower, gently pat wound dry prior to applying new dressing. - wash with hibiclens daily and leave on 5 mins Wound #2 Right,Medial Malleolus o Clean wound with Normal Saline. o May Shower, gently pat wound dry prior to applying new dressing. - wash with hibiclens daily and leave on 5 mins Anesthetic (add to Medication List) Wound #1 Right,Lateral Malleolus o Topical Lidocaine 4% cream applied to wound bed prior to debridement (In Clinic Only). Wound #2 Right,Medial Malleolus o Topical Lidocaine 4% cream applied to wound bed prior to debridement (In Clinic Only). Primary Wound Dressing Wound #1 Right,Lateral Malleolus o Hydrafera Blue Ready Transfer o Other: - Gentamicin cream to wound bed Wound #2 Right,Medial Malleolus o Hydrafera Blue Ready Transfer o Other: - Gentamicin cream to wound bed Secondary Dressing Wound #1 Right,Lateral Malleolus o Dry Gauze o Conform/Kerlix Wound #2 Right,Medial Malleolus o Dry Gauze o Conform/Kerlix Dressing Change Frequency Sperling, Torre C. ([id number]) Wound #1 Right,Lateral Malleolus o Change dressing every other day. Wound #2 Right,Medial Malleolus   o Change dressing every other day. Follow-up Appointments Wound #1 Right,Lateral Malleolus o Return Appointment in 2 weeks. Wound #2 Right,Medial Malleolus o Return Appointment in 2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 grafts for patients with underlying stasis and edema have a good long-term success rate. She also discuss skin substitutes which has been done here as well however I have not been able to get the surface of these wounds to something that I think would support skin substitutes. This is why he felt he might need an [name], [name]. ([id number]) operative debridement more aggressively than I can do in this clinic Review of systems; is otherwise negative he feels well 07/02/17; patient has been using Anasept gel with Sorbact. He changes this every day scrubs out the wound beds. Arrives today with the wound bed looking some better. Easier debridement 07/16/17; patient has been using Anasept gel was sorbact for about a month now. The necrotic service on his wound bed is better and the drainage is now down to minimal but we've not made any improvements in the epithelialization. Change to Santyl today. Surface of the wound is still not good enough to support skin substitutes 07/30/17 on evaluation today patient appears to be doing very well in regard to his right bimalleolus wounds. He has been tolerating the treatment with the Anasept gel and sorbact. However we were going to try and switch to Santyl to be more aggressive with these wounds. This was however cost prohibitive.  Therefore we will likely go back to the sorbact. Nonetheless he is not having any significant discomfort he still is forming slough  over the wound location but nothing too significant. The wounds do appear to be filling in nicely. 08/13/17; I have not seen this wound in about a month. There is not much change. The fact the area medially looks larger. He has been using sorbact and Anasept for over a month now without much effect. Arrives in clinic stating that he does not want the area debrided 08/28/17; patient arrives with the areas on his right medial and right lateral ankle worse. The wounds of expanded there is erythema and still drainage. There is no pain and no tenderness. We had been using Keracel AG clearly not doing well. 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 He states that even the evening that we began utilizing the Iodoflex he started having burning. Subsequently this never really improved and he eventually discontinue the use of the Iodoflex altogether. Unfortunately he did not let us know about any of this until today. I'm unsure of any specific infection at this point although I am going to obtain a wound culture today in order to see if there's anything that could potentially be causing an infection as well. It does sound as if he's had the Iodoflex before and did not have this kind of reaction although this does sound very specific for a reaction to the Iodoflex. 09/17/17 on evaluation today patient appears to be doing significantly better compared to last week's evaluation. At that time it actually appears that he did have an infection the good news is that we have been able to get this under control with switching to the [facility] solution he's not having as much pain and discomfort he still does have some erythema surrounding the medial aspect  wound. He has been tolerating the Dakin's soaked gauze dressing which is excellent and that his wounds do not seem to have as much adherent slough. Overall I'm pleased with the progress she's made in last week. 11/05/17 on evaluation today patient appears to be doing better in regard to his right medial and lateral malleolus ulcers. He has been tolerating the dressing changes without complication. I do flight the Freada Bergeron is helping with additional granulation at this point in the wound beds do appear to be a little bit better. With that being said patient does not appear to have any evidence of significant infection at this point there is no erythema as he has previously noted he does note that the 30 day course of antibiotics I placed him on will end tomorrow. 11/12/17 on evaluation today patient actually appears to be doing excellent in regard to his right medial and lateral malleolus ulcers. He has been tolerating the dressing changes without complication. Fortunately he has no evidence of infection at this time and overall I believe he is progressing extremely nicely he has a lot of new epithelialization noted on evaluation today. Patient likewise is very pleased with the progress even just since last week. 11/19/17 on evaluation today patient appears to be doing about the same in regard to his ulcerations. He does not have any additional breakdown although he really has not  noted any significant improvement either over the past week. With that being said the more concerning thing at this time is that he is beginning to show signs of erythema surrounding both wounds which is what typically happens and then he will develop into a more overt infection. This has been the course for some time. I hope that doing the 30 day in a biotic cycle this past time would break that so that he would not have the same type of thing happen again. Nonetheless it appears that is digressing back into this infected state  unfortunately. With that being said he is not having any pain currently which is good he typically does start to hurt more as this progresses. Fortunately there does not appear to be any evidence of infection systemically which is good news. 11/26/17 on evaluation today patient appears to actually be doing rather well in regard to his ulcer compared to last week. He still has your theme and noted around both the medial and lateral malleolus ulcers of the right lower extremity. He does not seem to be quite as overtly infected however. He has been tolerating the dressing changes without complication which is good news. The gentamicin cream does seem to have been of benefit for him. With that being said he does actually have an appointment with Dr. Sampson Goon this morning to see if there's anything that would be recommended in the way of IV antibiotic [name], [name] therapy. Otherwise he still has slough noted on the surface of the wound. 12/03/17 on evaluation today patient presents for follow-up concerning his ongoing right lower extremity ulcers. He has been tolerating the dressing changes without complication he states he has not really been using gentamicin on the lateral ankle and this seems to be doing very well. For that reason I think that is probably fine to put a hold on the gentamicin he states his burns and stings when he applies it and we maybe be able to just use this intermittently when things become more irritated. Other than that the good news is I did review his MRI which was performed on 11/28/17 and revealed that he has a negative MRI for abscess, osteomyelitis, or septic joint. Obviously these were the issues that I was concerned with the possibility of and I'm pleased to find that there is no problems in that regard. I did also review Dr. Jarrett Ables note from infectious disease he discussed performed some lab work which apparently was done and then subsequently was  going to consider the possibility of Keflex long-term for prevention of infection although this has not been prescribed as of yet. 12/09/17 on evaluation today patient appears to be doing a little bit worse in regard to his right medial and lateral malleolus ulcers. Fortunately the wound does not seem to be significantly worse although he does have a lot more drainage especially the lateral aspect he is having a lot more pain than what he has noted more recently. Fortunately there does not appear to be any evidence of systemic infection which is good news. Again he has seen Dr. Sampson Goon but he has not been placed on the potential Keflex which was recommended as a possibility at least yet. 12/17/17 unfortunately on evaluation today the patient's wound appears to be doing much worse. He has been performing the dressing changes as directed. Upon a review of notes in epic it does appear that Dr. Jarrett Ables office specifically sending Keflex for the patient on the 16th which was  last Tuesday. With that being said the patient states that though this was sent into [facility] that [facility] stated they never received it and subsequently the patient never took anything. He tells me he has been in bed since Friday due to the increased pain and overall general malaise. No fevers, chills, nausea, or vomiting noted at this time. 02/25/18 on evaluation today patient appears to be doing rather well in regard to his bilateral right malleolus ulcers. He has been tolerating the dressing changes without complication. Currently there does not appear to be any evidence of infection. Overall I'm very pleased with the progress that seems to been made up to this point. 03/11/18 on evaluation today patient actually appears to be doing very well in regard to his right ankle ulcers. He's been tolerating the dressing changes without complication. Fortunately there does not appear to be any evidence of sniffing infection I do  believe that the gentamicin cream continues to be of benefit for him. Fortunately he is showing signs of healing in which time I see him. He also tells me he's having no pain. 03/25/18 on evaluation today patient actually appears to show signs of improvement especially in regard to the right lateral ankle ulcer. He has been tolerating the dressing changes without complication. In general I'm very pleased with the progress that has been made up to this point. 04/08/18- He presents in follow up evaluation for right bimalleolar wounds; there is essentially no change. He admits to removing the dressing at night to allow the wounds to be "air out". We will continue with same treatment plan and he will follow up in two weeks 04/22/18 on evaluation today patient seems very frustrated with this situation currently as far as the ulcer on his right medial and lateral malleolus is concerned. He states this has been going on a very long time and obviously he does have a lot of bills as well is far as wound care is concerned. With that being said he tells me that he's been tolerating the dressing changes well although he did clarify that what he is doing is putting the medicine on in the morning after shower and then subsequently he takes the dressing off at night leaving it open to air. I previously asked him about this and I was not aware that he was doing this. Nonetheless that may be slowing things down a bit at this point. Fortunately there does not appear to be any evidence of severe infection although he does have some evidence of your theme is surrounding the wound bed. He is still using the gentamicin cream. 05/06/18 on evaluation today patient actually presents for evaluation of his right medial malleolus ulcers. He's been tolerating the dressing changes without complication. With that being said it does appear that he is doing better compared to last evaluation. He's not having the pain like he did  previous. He also states that blisters that he had coming up when I saw him last did resolve and ended up doing okay he did go to the ER they gave him some cream to put on it he's not sure what the name of the cream was. He was down in [facility] when he called me and I spoke with him previous. Nonetheless he states that they told him to put the medicine on his our due list. With that being said I'm not really 100% sure that the medicine had anything to do with this due to the fact that again he  was having the blisters when he came into the office which is why we actually put them on the anabiotic I was concerned about him having an infection which was causing the blistering and increased pain in his extremity. Nonetheless this is something that we can definitely be cautious of in the future it was Bactrim [name], [name] that we had him on. Overall I'm glad he is doing better. 05/20/18 on evaluation today patient actually appears to be doing fairly well in regard to his ulcer on the right lateral and medial malleolus locations. Both seem to be doing decently well he does have slough building up on the surface of the wound. He has been using the Hibiclense as I instructed him. Overall there's no evidence of significant infection which is good news. 06/02/18 on evaluation today patient actually appears to be doing excellent in regard to his right medial and lateral malleolus ulcers. He has been tolerating the dressing changes without complication. There does not appear to be any evidence of infection at this time which is great news. In general I feel like he has made great progress in the past several weeks. He has good granulation noted over areas that have not had good granulation previously and epithelialization even more so in several areas. Both wounds measured smaller. 06/17/18 an evaluation today patient actually appears to be doing excellent in regard to his right lateral  and medial malleolus ulcers. He has been tolerating the dressing changes without complication fortunately the Hydrofera Blue Dressing to be doing excellent for him. He's actually making progress each time I see him. 07/08/18 on evaluation today patient's wounds on the right medial and lateral malleolus or region show signs of improvement currently. He has been tolerating the dressing changes without complication at this time. He does feel like the [facility] Dressing has been of benefit for him. No fevers, chills, nausea, or vomiting noted at this time. 07/22/18 and evaluation today patient actually appears to be doing excellent in regard to his right medial and lateral malleolus ulcers. He's been tolerating the dressing changes without complication the Hydrofera Blue Dressing years be excellent for him. Overall I'm extremely pleased in this regard. 08/05/18 on evaluation today patient's right lateral and medial malleolus ulcers appear to be doing well at this point. There does not appear to be any evidence of infection which is good news. Overall he does have some Slough noted which is going to require sharp debridement but other than this I see no current issues 08/19/18 on evaluation today patient actually appears to be doing better yet again in regard to his lower Trinity ulcer on the right medial and lateral malleolus regions. He is having no pain and no significant signs of infection which is great news. Overall things seem to be progressing quite nicely which is great news. No fevers, chills, nausea, or vomiting noted at this time. 09/09/18 on evaluation today patient appears to be doing very well in regard to his medial and lateral ankle ulcers. He's been tolerating the dressing changes without complication. Fortunately there is no evidence of infection at this time which is good news. No fevers, chills, nausea, or vomiting noted at this time. 09/23/18 on evaluation today patient appears  to be doing rather well in regard to his right medial and lateral ankle ulcers. He is been tolerating the dressing changes and still things continue to do excellent. Overall very pleased with the progress and there's no signs of infection today. 10/07/18  on evaluation today patient appears to be doing very well in regard to his right medial and lateral ankle ulcers. He's been tolerating the dressing changes without complication. Fortunately there's no signs of infection at this time. Overall the patient states that he is very pleased with how things seem to be progressing. 10/21/18 on evaluation today patient actually appears to be doing very well in regard to his right medial and lateral malleolus ulcers. He's been tolerating the dressing changes without complication overall I'm very pleased with how things seem to be progressing. There's no signs of infection. 11/04/18 on evaluation today patient actually appears to be feeling very well at this point. The medial wound bed actually show signs of improvement and looks to be doing very well. The lateral wing then actually is not want to touch with this show signs of increased drainage for some more Slough buildup. He's also having more pain which he states started last night. Nonetheless I'm concerned in this regard about the possibility of infection. No fevers, chills, nausea, or vomiting noted at this time. He has been since August 2019 since he last was pretty for infection. 12/16/18 on evaluation today patient appears to be doing very well in regard to his medial and lateral malleolus ulcers. It has been a little over a month since I've last seen him secondary to family issues that he's had as well as issues with the coronavirus pandemic. Nonetheless he has continued to follow the directions for dressing changes in the interim since I last saw and and appears to be doing excellent. In fact his medial ulcer seems significantly smaller the lateral is  also smaller but again it was a larger wound to begin with. Overall I do feel like he's making good progress however. [name], [name] ([id number]) 01/13/19 on evaluation today patient actually appears to be doing very well in regard to his ankle ulcers. The medial seems to be almost completely healed. The lateral is doing much better even compared to last time I saw him. I'm very pleased overall with how things seem to be progressing. The patient is likewise extremely happy. 01/27/19 on evaluation today patient appears to be doing very well in regard to his lower extremity ulcers. He is been tolerating the dressing changes without complication. Fortunately he seems to be making good progress. Overall been very pleased over the past several visits with the way the wound seem to be closing in quite nicely. There's no sign of active infection at this time. Patient History Information obtained from Patient. Social History Former smoker - 10 years ago, Marital Status - Married, Alcohol Use - Moderate, Drug Use - No History, Caffeine Use - Never. Medical History Eyes Patient has history of Cataracts - removed Denies history of Glaucoma, Optic Neuritis Ear/Nose/Mouth/Throat Denies history of Chronic sinus problems/congestion Hematologic/Lymphatic Denies history of Anemia, Hemophilia, Human Immunodeficiency Virus, Lymphedema, Sickle Cell Disease Respiratory Patient has history of Chronic Obstructive Pulmonary Disease (COPD) Denies history of Aspiration, Asthma, Pneumothorax, Sleep Apnea, Tuberculosis Cardiovascular Denies history of Angina, Arrhythmia, Congestive Heart Failure, Coronary Artery Disease, Deep Vein Thrombosis, Hypertension, Hypotension, Myocardial Infarction, Peripheral Arterial Disease, Peripheral Venous Disease, Phlebitis, Vasculitis Gastrointestinal Denies history of Cirrhosis , Colitis, Crohn s, Hepatitis A, Hepatitis B, Hepatitis C Endocrine Denies history of Type I  Diabetes, Type II Diabetes Genitourinary Denies history of End Stage Renal Disease Immunological Denies history of Lupus Erythematosus, Raynaud s Integumentary (Skin) Denies history of History of Burn, History of pressure wounds Musculoskeletal Patient has  history of Osteoarthritis Denies history of Gout, Rheumatoid Arthritis, Osteomyelitis Neurologic Denies history of Dementia, Neuropathy, Quadriplegia, Paraplegia, Seizure Disorder Oncologic Denies history of Received Chemotherapy, Received Radiation Psychiatric Denies history of Anorexia/bulimia, Confinement Anxiety Medical And Surgical History Notes Constitutional Symptoms (General Health) Vein Filter (groin area); CODA; H/O Blood Clots; pulmonary hypertensive arterial disease Review of Systems (ROS) Constitutional Symptoms (General Health) Denies complaints or symptoms of Fatigue, Fever, Chills, Marked Weight Change. [name], Adelfo C. ([id number]) Respiratory Denies complaints or symptoms of Chronic or frequent coughs, Shortness of Breath. Cardiovascular Denies complaints or symptoms of Chest pain, LE edema. Psychiatric Denies complaints or symptoms of Anxiety, Claustrophobia. Objective Constitutional Well-nourished and well-hydrated in no acute distress. Vitals Time Taken: 8:15 AM, Height: 76 in, Weight: 238 lbs, BMI: 29, Temperature: 98.2 F, Pulse: 71 bpm, Respiratory Rate: 16 breaths/min, Blood Pressure: 145/62 mmHg. Respiratory normal breathing without difficulty. clear to auscultation bilaterally. Cardiovascular regular rate and rhythm with normal S1, S2. Psychiatric this patient is able to make decisions and demonstrates good insight into disease process. Alert and Oriented x 3. pleasant and cooperative. General Notes: Patient's wound bed currently showed signs of good granulation at those locations. With some Slough noted that part sharp debridement of both sites as well. He tolerated the debridement well  today without complication post debridement wound bed appears to be doing much better which is excellent news. Integumentary (Hair, Skin) Wound #1 status is Open. Original cause of wound was Gradually Appeared. The wound is located on the Right,Lateral Malleolus. The wound measures 4.5cm length x 2.5cm width x 0.2cm depth; 8.836cm^2 area and 1.767cm^3 volume. There is Fat Layer (Subcutaneous Tissue) Exposed exposed. There is no tunneling or undermining noted. There is a medium amount of serous drainage noted. The wound margin is flat and intact. There is small (1-33%) pink granulation within the wound bed. There is a large (67-100%) amount of necrotic tissue within the wound bed including Adherent Slough. Wound #2 status is Open. Original cause of wound was Gradually Appeared. The wound is located on the Right,Medial Malleolus. The wound measures 1cm length x 0.7cm width x 0.1cm depth; 0.55cm^2 area and 0.055cm^3 volume. There is Fat Layer (Subcutaneous Tissue) Exposed exposed. There is no tunneling or undermining noted. There is a medium amount of serous drainage noted. The wound margin is flat and intact. There is medium (34-66%) pink granulation within the wound bed. There is a medium (34-66%) amount of necrotic tissue within the wound bed including Adherent Slough. Assessment [name], [name] 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 There was a Excisional Skin/Subcutaneous Tissue Debridement  with a total area of 11.25 sq cm performed by STONE III, HOYT E., PA-C. With the following instrument(s): Curette to remove Viable and Non-Viable tissue/material. Material removed includes Subcutaneous Tissue, Slough, and Fibrin/Exudate after achieving pain control using Lidocaine. No specimens were taken. A time out was conducted at 08:24, prior to the start of the procedure. A Minimum amount of bleeding was controlled with Pressure. The procedure was tolerated well. Post Debridement Measurements: 4.5cm length x 2.5cm width x 0.1cm depth; 0.884cm^3 volume. Character of Wound/Ulcer Post  Debridement requires further debridement. Severity of Tissue Post Debridement is: Fat layer exposed. 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 There was a Excisional Skin/Subcutaneous Tissue Debridement with a total area of 0.7 sq cm performed by STONE III, HOYT E., PA-C. With the following instrument(s): Curette to remove Viable and Non-Viable tissue/material. Material removed includes Subcutaneous Tissue, Slough, and Fibrin/Exudate after achieving pain control using Lidocaine. No specimens were taken. A time out was conducted at 08:24, prior to the start of the procedure. A Minimum amount of bleeding was controlled with Pressure. The procedure was tolerated well. Post Debridement Measurements: 1cm length x 0.7cm width x 0.1cm depth; 0.055cm^3 volume. Character of Wound/Ulcer Post Debridement requires further debridement. Severity of Tissue Post Debridement is: Fat layer exposed. Post procedure Diagnosis Wound #2: Same as Pre-Procedure Plan Wound Cleansing: Wound #1 Right,Lateral Malleolus: Clean wound with Normal Saline. May Shower, gently pat wound dry prior to applying new dressing. - wash with hibiclens daily and leave on 5  mins Wound #2 Right,Medial Malleolus: Clean wound with Normal Saline. May Shower, gently pat wound dry prior to applying new dressing. - wash with hibiclens daily and leave on 5 mins Anesthetic (add to Medication List): Wound #1 Right,Lateral Malleolus: Topical Lidocaine 4% cream applied to wound bed prior to debridement (In Clinic Only). Wound #2 Right,Medial Malleolus: NAZEER, ROMNEY. ([id number]) Topical Lidocaine 4% cream applied to wound bed prior to debridement (In Clinic Only). Primary Wound Dressing: Wound #1 Right,Lateral Malleolus: Hydrafera Blue Ready Transfer Other: - Gentamicin cream to wound bed Wound #2 Right,Medial Malleolus: Hydrafera Blue Ready Transfer Other: - Gentamicin cream to wound bed Secondary Dressing: Wound #1 Right,Lateral Malleolus: Dry Gauze Conform/Kerlix Wound #2 Right,Medial Malleolus: Dry Gauze Conform/Kerlix Dressing Change Frequency: Wound #1 Right,Lateral Malleolus: Change dressing every other day. Wound #2 Right,Medial Malleolus: Change dressing every other day. Follow-up Appointments: Wound #1 Right,Lateral Malleolus: Return Appointment in 2 weeks. Wound #2 Right,Medial Malleolus: Return Appointment in 2 weeks. Edema Control: Wound #1 Right,Lateral Malleolus: Elevate legs to the level of the heart and pump ankles as often as possible Wound #2 Right,Medial Malleolus: Elevate legs to the level of the heart and pump ankles as often as possible Additional Orders / Instructions: Wound #1 Right,Lateral Malleolus: Increase protein intake. Wound #2 Right,Medial Malleolus: Increase protein intake. I'm gonna suggest we continue with the above wound care measures for the next week and the patient is in agreement with that plan. If anything changes or worsens in the meantime he will contact the office and let me know. Please see above for specific wound care orders. We will see patient for re-evaluation in 2 week(s) here in the clinic.  If anything worsens or changes patient will contact our office for additional recommendations. Electronic Signature(s) Signed: 01/28/2019 2:50:08 AM By: Lenda Kelp PA-C Entered By: Lenda Kelp on 01/28/2019 00:03:06 [name] ([id number]) -------------------------------------------------------------------------------- ROS/PFSH Details Patient Name: [name] Date of Service: 01/27/2019 8:00 AM Medical Record Number: [id number] Patient Account Number: [id number] Date of Birth/Sex: [birth year] (73 y.o. M) Treating RN: Huel Coventry Primary Care Provider: [name] Other Clinician: Referring Provider: [name] Treating Provider/Extender: STONE III, HOYT Weeks in Treatment: 158 Information Obtained From Patient Constitutional Symptoms (General Health) Complaints and Symptoms: Negative for: Fatigue; Fever; Chills; Marked Weight Change Medical History: Past Medical History Notes: Vein Filter (  groin area); CODA; H/O Blood Clots; pulmonary hypertensive arterial disease Respiratory Complaints and Symptoms: Negative for: Chronic or frequent coughs; Shortness of Breath Medical History: Positive for: Chronic Obstructive Pulmonary Disease (COPD) Negative for: Aspiration; Asthma; Pneumothorax; Sleep Apnea; Tuberculosis Cardiovascular Complaints and Symptoms: Negative for: Chest pain; LE edema Medical History: Negative for: Angina; Arrhythmia; Congestive Heart Failure; Coronary Artery Disease; Deep Vein Thrombosis; Hypertension; Hypotension; Myocardial Infarction; Peripheral Arterial Disease; Peripheral Venous Disease; Phlebitis; Vasculitis Psychiatric Complaints and Symptoms: Negative for: Anxiety; Claustrophobia Medical History: Negative for: Anorexia/bulimia; Confinement Anxiety Eyes Medical History: Positive for: Cataracts - removed Negative for: Glaucoma; Optic Neuritis Ear/Nose/Mouth/Throat Medical History: Negative for: Chronic sinus  problems/congestion LESLEE, HAUETER ([id number]) Hematologic/Lymphatic Medical History: Negative for: Anemia; Hemophilia; Human Immunodeficiency Virus; Lymphedema; Sickle Cell Disease Gastrointestinal Medical History: Negative for: Cirrhosis ; Colitis; Crohnos; Hepatitis A; Hepatitis B; Hepatitis C Endocrine Medical History: Negative for: Type I Diabetes; Type II Diabetes Genitourinary Medical History: Negative for: End Stage Renal Disease Immunological Medical History: Negative for: Lupus Erythematosus; Raynaudos Integumentary (Skin) Medical History: Negative for: History of Burn; History of pressure wounds Musculoskeletal Medical History: Positive for: Osteoarthritis Negative for: Gout; Rheumatoid Arthritis; Osteomyelitis Neurologic Medical History: Negative for: Dementia; Neuropathy; Quadriplegia; Paraplegia; Seizure Disorder Oncologic Medical History: Negative for: Received Chemotherapy; Received Radiation HBO Extended History Items Eyes: Cataracts Immunizations Pneumococcal Vaccine: Received Pneumococcal Vaccination: No Implantable Devices No devices added Family and Social History Former smoker - 10 years ago; Marital Status - Married; Alcohol Use: Moderate; Drug Use: No History; Caffeine Use: Never KENYATTE, CHATMON ([id number]) Physician Affirmation I have reviewed and agree with the above information. Electronic Signature(s) Signed: 01/29/2019 8:12:09 AM By: Elliot Gurney, BSN, RN, CWS, Kim RN, BSN Signed: 01/30/2019 11:48:14 AM By: Lenda Kelp PA-C Previous Signature: 01/28/2019 2:50:08 AM Version By: Lenda Kelp PA-C Entered By: Elliot Gurney BSN, RN, CWS, Kim on 01/29/2019 07:54:13 [name] ([id number]) -------------------------------------------------------------------------------- SuperBill Details Patient Name: [name] Date of Service: 01/27/2019 Medical Record Number: [id number] Patient Account Number: [id number] Date of Birth/Sex:  11-30-1945 (73 y.o. 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, sequela T81.31XS Disruption of external operation (surgical) wound, not elsewhere classified, sequela Facility Procedures CPT4 Code: 09628366 Description: 11042 - DEB SUBQ TISSUE 20 SQ CM/< ICD-10 Diagnosis Description L97.312 Non-pressure chronic ulcer of right ankle with fat layer ex Modifier: posed Quantity: 1 Physician Procedures CPT4 Code: [id number] Description: 11042 - WC PHYS SUBQ TISS 20 SQ CM ICD-10 Diagnosis Description 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

## 2019-02-10 ENCOUNTER — Other Ambulatory Visit: Payer: Self-pay

## 2019-02-10 ENCOUNTER — Encounter: Payer: Medicare HMO | Admitting: Physician Assistant

## 2019-02-10 DIAGNOSIS — L97312 Non-pressure chronic ulcer of right ankle with fat layer exposed: Secondary | ICD-10-CM | POA: Diagnosis not present

## 2019-02-11 NOTE — Progress Notes (Signed)
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 was already on. No major improvement in the condition of the wounds in fact the medial wound looks larger. Again per the patient she did not feel a skin graft or operative debridement was indicated The patient had previous venous ablation in February 2018. Last arterial studies were in December 2017 and were within normal limits 06/18/17; patient arrives back in clinic today using Anasept gel with sorbact. He changes this every day is wearing his own compression stocking. The patient actually saw Dr. Leta Baptist of plastic surgery. I've reviewed her note. The appointment was on 05/30/17. The basic issue with here was that she did not feel that skin grafts for patients with underlying stasis and edema have a good long-term success rate. She also discuss skin substitutes which has been done here as well however I have not been able to get the surface of these wounds to something that I think would support skin substitutes. This is why he felt he might need an operative debridement more aggressively than I can do in this clinic Review of systems; is otherwise negative he feels well 07/02/17; patient has been using Anasept gel with Sorbact. He changes this  every day scrubs out the wound beds. Arrives today with the wound bed looking some better. Easier debridement 07/16/17; patient has been using Anasept gel was sorbact for about a month now. The necrotic service on his wound bed is better and the drainage is now down to minimal but we've not made any improvements in the epithelialization. Change to KENSHIN, SPLAWN ([id number]) Santyl today. Surface of the wound is still not good enough to support skin substitutes 07/30/17 on evaluation today patient appears to be doing very well in  regard to his right bimalleolus wounds. He has been tolerating the treatment with the Anasept gel and sorbact. However we were going to try and switch to Santyl to be more aggressive with these wounds. This was however cost prohibitive. Therefore we will likely go back to the sorbact. Nonetheless he is not having any significant discomfort he still is forming slough over the wound location but nothing too significant. The wounds do appear to be filling in nicely. 08/13/17; I have not seen this wound in about a month. There is not much change. The fact the area medially looks larger. He has been using sorbact and Anasept for over a month now without much effect. Arrives in clinic stating that he does not want the area debrided 08/28/17; patient arrives with the areas on his right medial and right lateral ankle worse. The wounds of expanded there is erythema and still drainage. There is no pain and no tenderness. We had been using Keracel AG clearly not doing well. 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 He states that even the evening that we began utilizing the Iodoflex he started having  burning. Subsequently this never really improved and he eventually discontinue the use of the Iodoflex altogether. Unfortunately he did not let us know about any of this until today. I'm unsure of any specific infection at this point although I am going to obtain a wound culture today in order to see if there's anything that could potentially be causing an infection as well. It does sound as if he's had the Iodoflex before and did not have this kind of reaction although this does sound very specific for a reaction to the Iodoflex. 09/17/17 on evaluation today patient appears to be doing significantly better compared to last week's evaluation. At that time it actually appears that he did have an infection the good news is that we have been able to get this under control with switching to the [facility] solution he's not having as much pain and discomfort he still does have some erythema surrounding the medial aspect wound. He has been tolerating the Dakin's soaked gauze dressing which is excellent and that his wounds do not seem to have as much adherent slough. Overall I'm pleased with the progress she's made in last week. 11/05/17 on evaluation today patient appears to be doing better in regard to his right medial and lateral malleolus ulcers. He has been tolerating the dressing changes without complication. I do flight the Freada Bergeronrisma is helping with additional granulation at this point in the wound beds do appear to be a little bit better. With that being said patient does not appear to have any evidence of significant infection at this point there is no erythema as he has previously noted he does note that the 30 day course of antibiotics I placed him on will end tomorrow. 11/12/17 on evaluation today patient actually appears to be doing excellent in regard to his right medial and lateral malleolus ulcers. He has been tolerating the dressing changes without complication. Fortunately he has no evidence of  infection at this time and overall I believe he is progressing extremely nicely he has a lot of new epithelialization noted on evaluation today. Patient likewise  is very pleased with the progress even just since last week. 11/19/17 on evaluation today patient appears to be doing about the same in regard to his ulcerations. He does not have any additional breakdown although he really has not noted any significant improvement either over the past week. With that being said the more concerning thing at this time is that he is beginning to show signs of erythema surrounding both wounds which is what typically happens and then he will develop into a more overt infection. This has been the course for some time. I hope that doing the 30 day in a biotic cycle this past time would break that so that he would not have the same type of thing happen again. Nonetheless it appears that is digressing back into this infected state unfortunately. With that being said he is not having any pain currently which is good he typically does start to hurt more as this progresses. Fortunately there does not appear to be any evidence of infection systemically which is good news. 11/26/17 on evaluation today patient appears to actually be doing rather well in regard to his ulcer compared to last week. He still has your theme and noted around both the medial and lateral malleolus ulcers of the right lower extremity. He does not seem to be quite as overtly infected however. He has been tolerating the dressing changes without complication which is good news. The gentamicin cream does seem to have been of benefit for him. With that being said he does actually have an appointment with Dr. Sampson Goon this morning to see if there's anything that would be recommended in the way of IV antibiotic therapy. Otherwise he still has slough noted on the surface of the wound. 12/03/17 on evaluation today patient presents for follow-up concerning  his ongoing right lower extremity ulcers. He has been tolerating the dressing changes without complication he states he has not really been using gentamicin on the lateral ankle and this seems to be doing very well. For that reason I think that is probably fine to put a hold on the gentamicin he states his burns and stings when he applies it and we maybe be able to just use this intermittently when things become more irritated. [name], [name] ([id number]) Other than that the good news is I did review his MRI which was performed on 11/28/17 and revealed that he has a negative MRI for abscess, osteomyelitis, or septic joint. Obviously these were the issues that I was concerned with the possibility of and I'm pleased to find that there is no problems in that regard. I did also review Dr. Jarrett Ables note from infectious disease he discussed performed some lab work which apparently was done and then subsequently was going to consider the possibility of Keflex long-term for prevention of infection although this has not been prescribed as of yet. 12/09/17 on evaluation today patient appears to be doing a little bit worse in regard to his right medial and lateral malleolus ulcers. Fortunately the wound does not seem to be significantly worse although he does have a lot more drainage especially the lateral aspect he is having a lot more pain than what he has noted more recently. Fortunately there does not appear to be any evidence of systemic infection which is good news. Again he has seen Dr. Sampson Goon but he has not been placed on the potential Keflex which was recommended as a possibility at least yet. 12/17/17 unfortunately on evaluation today the patient's wound appears  to be doing much worse. He has been performing the dressing changes as directed. Upon a review of notes in epic it does appear that Dr. Jarrett Ables office specifically sending Keflex for the patient on the 16th which was last  Tuesday. With that being said the patient states that though this was sent into [facility] that [facility] stated they never received it and subsequently the patient never took anything. He tells me he has been in bed since Friday due to the increased pain and overall general malaise. No fevers, chills, nausea, or vomiting noted at this time. 02/25/18 on evaluation today patient appears to be doing rather well in regard to his bilateral right malleolus ulcers. He has been tolerating the dressing changes without complication. Currently there does not appear to be any evidence of infection. Overall I'm very pleased with the progress that seems to been made up to this point. 03/11/18 on evaluation today patient actually appears to be doing very well in regard to his right ankle ulcers. He's been tolerating the dressing changes without complication. Fortunately there does not appear to be any evidence of sniffing infection I do believe that the gentamicin cream continues to be of benefit for him. Fortunately he is showing signs of healing in which time I see him. He also tells me he's having no pain. 03/25/18 on evaluation today patient actually appears to show signs of improvement especially in regard to the right lateral ankle ulcer. He has been tolerating the dressing changes without complication. In general I'm very pleased with the progress that has been made up to this point. 04/08/18- He presents in follow up evaluation for right bimalleolar wounds; there is essentially no change. He admits to removing the dressing at night to allow the wounds to be "air out". We will continue with same treatment plan and he will follow up in two weeks 04/22/18 on evaluation today patient seems very frustrated with this situation currently as far as the ulcer on his right medial and lateral malleolus is concerned. He states this has been going on a very long time and obviously he does have a lot of bills as well is far as  wound care is concerned. With that being said he tells me that he's been tolerating the dressing changes well although he did clarify that what he is doing is putting the medicine on in the morning after shower and then subsequently he takes the dressing off at night leaving it open to air. I previously asked him about this and I was not aware that he was doing this. Nonetheless that may be slowing things down a bit at this point. Fortunately there does not appear to be any evidence of severe infection although he does have some evidence of your theme is surrounding the wound bed. He is still using the gentamicin cream. 05/06/18 on evaluation today patient actually presents for evaluation of his right medial malleolus ulcers. He's been tolerating the dressing changes without complication. With that being said it does appear that he is doing better compared to last evaluation. He's not having the pain like he did previous. He also states that blisters that he had coming up when I saw him last did resolve and ended up doing okay he did go to the ER they gave him some cream to put on it he's not sure what the name of the cream was. He was down in [facility] when he called me and I spoke with him previous. Nonetheless he  states that they told him to put the medicine on his our due list. With that being said I'm not really 100% sure that the medicine had anything to do with this due to the fact that again he was having the blisters when he came into the office which is why we actually put them on the anabiotic I was concerned about him having an infection which was causing the blistering and increased pain in his extremity. Nonetheless this is something that we can definitely be cautious of in the future it was Bactrim that we had him on. Overall I'm glad he is doing better. 05/20/18 on evaluation today patient actually appears to be doing fairly well in regard to his ulcer on the right lateral and  medial malleolus locations. Both seem to be doing decently well he does have slough building up on the surface of the wound. He has been using the Hibiclense as I instructed him. Overall there's no evidence of significant infection which is good news. RIKKI, SMESTAD ([id number]) 06/02/18 on evaluation today patient actually appears to be doing excellent in regard to his right medial and lateral malleolus ulcers. He has been tolerating the dressing changes without complication. There does not appear to be any evidence of infection at this time which is great news. In general I feel like he has made great progress in the past several weeks. He has good granulation noted over areas that have not had good granulation previously and epithelialization even more so in several areas. Both wounds measured smaller. 06/17/18 an evaluation today patient actually appears to be doing excellent in regard to his right lateral and medial malleolus ulcers. He has been tolerating the dressing changes without complication fortunately the Hydrofera Blue Dressing to be doing excellent for him. He's actually making progress each time I see him. 07/08/18 on evaluation today patient's wounds on the right medial and lateral malleolus or region show signs of improvement currently. He has been tolerating the dressing changes without complication at this time. He does feel like the [facility] Dressing has been of benefit for him. No fevers, chills, nausea, or vomiting noted at this time. 07/22/18 and evaluation today patient actually appears to be doing excellent in regard to his right medial and lateral malleolus ulcers. He's been tolerating the dressing changes without complication the Hydrofera Blue Dressing years be excellent for him. Overall I'm extremely pleased in this regard. 08/05/18 on evaluation today patient's right lateral and medial malleolus ulcers appear to be doing well at this point. There does not  appear to be any evidence of infection which is good news. Overall he does have some Slough noted which is going to require sharp debridement but other than this I see no current issues 08/19/18 on evaluation today patient actually appears to be doing better yet again in regard to his lower Trinity ulcer on the right medial and lateral malleolus regions. He is having no pain and no significant signs of infection which is great news. Overall things seem to be progressing quite nicely which is great news. No fevers, chills, nausea, or vomiting noted at this time. 09/09/18 on evaluation today patient appears to be doing very well in regard to his medial and lateral ankle ulcers. He's been tolerating the dressing changes without complication. Fortunately there is no evidence of infection at this time which is good news. No fevers, chills, nausea, or vomiting noted at this time. 09/23/18 on evaluation today patient appears to be doing rather  well in regard to his right medial and lateral ankle ulcers. He is been tolerating the dressing changes and still things continue to do excellent. Overall very pleased with the progress and there's no signs of infection today. 10/07/18 on evaluation today patient appears to be doing very well in regard to his right medial and lateral ankle ulcers. He's been tolerating the dressing changes without complication. Fortunately there's no signs of infection at this time. Overall the patient states that he is very pleased with how things seem to be progressing. 10/21/18 on evaluation today patient actually appears to be doing very well in regard to his right medial and lateral malleolus ulcers. He's been tolerating the dressing changes without complication overall I'm very pleased with how things seem to be progressing. There's no signs of infection. 11/04/18 on evaluation today patient actually appears to be feeling very well at this point. The medial wound bed actually  show signs of improvement and looks to be doing very well. The lateral wing then actually is not want to touch with this show signs of increased drainage for some more Slough buildup. He's also having more pain which he states started last night. Nonetheless I'm concerned in this regard about the possibility of infection. No fevers, chills, nausea, or vomiting noted at this time. He has been since August 2019 since he last was pretty for infection. 12/16/18 on evaluation today patient appears to be doing very well in regard to his medial and lateral malleolus ulcers. It has been a little over a month since I've last seen him secondary to family issues that he's had as well as issues with the coronavirus pandemic. Nonetheless he has continued to follow the directions for dressing changes in the interim since I last saw and and appears to be doing excellent. In fact his medial ulcer seems significantly smaller the lateral is also smaller but again it was a larger wound to begin with. Overall I do feel like he's making good progress however. 01/13/19 on evaluation today patient actually appears to be doing very well in regard to his ankle ulcers. The medial seems to be almost completely healed. The lateral is doing much better even compared to last time I saw him. I'm very pleased overall with how things seem to be progressing. The patient is likewise extremely happy. 01/27/19 on evaluation today patient appears to be doing very well in regard to his lower extremity ulcers. He is been tolerating Trompeter, Truth C. ([id number]) the dressing changes without complication. Fortunately he seems to be making good progress. Overall been very pleased over the past several visits with the way the wound seem to be closing in quite nicely. There's no sign of active infection at this time. 02/10/19 on evaluation today patient appears to be doing excellent in regard to both wounds on his right lower extremity.  He's been tolerating the dressing changes without complication. Fortunately there's no signs of active infection at this time. Both forms are significantly smaller especially the medial but even the lateral has shown signs of excellent progress in fact it separated into really to wounds there were still measurements as one for the time being that may change in the next visit or so. Electronic Signature(s) Signed: 02/11/2019 6:29:07 AM By: Lenda Kelp PA-C Entered By: Lenda Kelp on 02/10/2019 08:34:20 [name] ([id number]) -------------------------------------------------------------------------------- Physical Exam Details Patient Name: [name] Date of Service: 02/10/2019 8:00 AM Medical Record Number: [id number] Patient Account Number: [id number] Date  of Birth/Sex: 12-18-1945 (73 y.o. M) Treating RN: Curtis Sites Primary Care Provider: [name] Other Clinician: Referring Provider: [name] Treating Provider/Extender: STONE III, Gemini Bunte Weeks in Treatment: 160 Constitutional Well-nourished and well-hydrated in no acute distress. Respiratory normal breathing without difficulty. Notes Patient's wound bed currently shows excellent improvement there's absolutely no signs of infection everything looks to be doing excellent his wife is taking great care of this. Overall very pleased with the progress and how things appear at this time. He has no significant swelling. Electronic Signature(s) Signed: 02/11/2019 6:29:07 AM By: Lenda Kelp PA-C Entered By: Lenda Kelp on 02/10/2019 08:34:47 [name] ([id number]) -------------------------------------------------------------------------------- Physician Orders Details Patient Name: [name] Date of Service: 02/10/2019 8:00 AM Medical Record Number: [id number] Patient Account Number: [id number] Date of Birth/Sex: 05-14-1946 (73 y.o. 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 o May Shower, gently pat wound dry prior to applying new dressing. - wash with hibiclens daily and leave on 5 mins Wound #2 Right,Medial Malleolus o Clean wound with Normal Saline. o May Shower, gently pat wound dry prior to applying new dressing. - wash with hibiclens daily and leave on 5 mins Anesthetic (add to Medication List) Wound #1 Right,Lateral Malleolus o Topical Lidocaine 4% cream applied to wound bed prior to debridement (In Clinic Only). Wound #2 Right,Medial Malleolus o Topical Lidocaine 4% cream applied to wound bed prior to debridement (In Clinic Only). Primary Wound Dressing Wound #1 Right,Lateral Malleolus o Hydrafera Blue Ready Transfer o Other: - Gentamicin cream to wound bed Wound #2 Right,Medial Malleolus o Hydrafera Blue Ready Transfer o Other: - Gentamicin cream to wound bed Secondary Dressing Wound #1 Right,Lateral Malleolus o Dry Gauze o Conform/Kerlix Wound #2 Right,Medial Malleolus o Dry Gauze o Conform/Kerlix Dressing Change Frequency Matsunaga, [name] ([id number]) Wound #1 Right,Lateral Malleolus o Change dressing every other day. Wound #2 Right,Medial Malleolus o Change dressing every other day. Follow-up Appointments o Return Appointment in 1 month Edema  Control Wound #1 Right,Lateral Malleolus o Elevate legs to the level of the heart and pump ankles as often as possible Wound #2 Right,Medial Malleolus o Elevate legs to the level of the heart and pump ankles as often as possible Additional Orders / Instructions Wound #1 Right,Lateral Malleolus o Increase protein intake. Wound #2 Right,Medial Malleolus o Increase protein intake. Electronic Signature(s) Signed: 02/10/2019 4:26:15 PM By: Curtis Sites Signed: 02/11/2019 6:29:07 AM By: Lenda Kelp PA-C Entered By: Curtis Sites on 02/10/2019 08:32:55 [name] ([id number]) -------------------------------------------------------------------------------- Problem List Details Patient Name: [name] Date of Service: 02/10/2019 8:00 AM Medical Record Number: [id number] Patient Account Number: [id number] Date of Birth/Sex: 12/25/1945 (73 y.o. 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 medially looks larger. He has been using sorbact and Anasept for over a month now without much effect. Arrives in clinic stating that he does not want the area debrided 08/28/17; patient arrives with the areas on his right medial and right lateral ankle worse. The wounds of expanded there is erythema and still drainage. There is no pain and no tenderness. We had been using Keracel AG clearly not doing well. 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 He states that even the evening that we began utilizing the Iodoflex he started having burning. Subsequently this never really improved and he eventually  discontinue the use of the Iodoflex altogether. Unfortunately he did not let us know about any of this until today. I'm unsure of any specific infection at this point although I am going to obtain a wound culture today in order to see if there's anything that could potentially be causing an infection as well. It does sound as if he's had the Iodoflex before and did not have this kind of reaction although this does sound very specific for a reaction to the Iodoflex. 09/17/17 on evaluation today patient appears to be doing significantly better compared to  last week's evaluation. At that time it actually appears that he did have an infection the good news is that we have been able to get this under control with switching to the [facility] solution he's not having as much pain and discomfort he still does have some erythema surrounding the medial aspect wound. He has been tolerating the Dakin's soaked gauze dressing which is excellent and that his wounds do not seem to have as much adherent slough. Overall I'm pleased with the progress she's made in last week. 11/05/17 on evaluation today patient appears to be doing better in regard to his right medial and lateral malleolus ulcers. He has been tolerating the dressing changes without complication. I do flight the Freada Bergeron is helping with additional granulation at this point in the wound beds do appear to be a little bit better. With that being said patient does not appear to have any evidence of significant infection at this point there is no erythema as he has previously noted he does note that the 30 day course of antibiotics I placed him on will end tomorrow. 11/12/17 on evaluation today patient actually appears to be doing excellent in regard to his right medial and lateral malleolus ulcers. He has been tolerating the dressing changes without complication. Fortunately he has no evidence of infection at this time and overall I believe he is progressing extremely nicely he has a lot of new epithelialization noted on evaluation today. Patient likewise is very pleased with the progress even just since last week. 11/19/17 on evaluation today patient appears to be doing about the same in regard to his ulcerations. He does not have any additional breakdown although he really has not noted any significant improvement either over the past week. With that being said the more concerning thing at this time is that he is beginning to show signs of erythema surrounding both wounds which is what typically happens and then  he will develop into a more overt infection. This has been the course for some time. I hope that doing the 30 day in a biotic cycle this past time would break that so that he would not have the same type of thing happen again. Nonetheless it appears that is digressing back into this infected state unfortunately. With that being said he is not having any pain currently which is good he typically does start to hurt more as this progresses. Fortunately there does not appear to be any evidence of infection systemically which is good news. 11/26/17 on evaluation today patient appears to actually be doing rather well in regard to his ulcer compared to last week. He still has your theme and noted around both the medial and lateral malleolus ulcers of the right lower extremity. He does not seem to be quite as overtly infected however. He has been tolerating the dressing changes without complication which is good news. The gentamicin cream does seem to have  been of benefit for him. With that being said he does actually have an appointment with Dr. Sampson Goon this morning to see if there's anything that would be recommended in the way of IV antibiotic [name], [name] ([id number]) therapy. Otherwise he still has slough noted on the surface of the wound. 12/03/17 on evaluation today patient presents for follow-up concerning his ongoing right lower extremity ulcers. He has been tolerating the dressing changes without complication he states he has not really been using gentamicin on the lateral ankle and this seems to be doing very well. For that reason I think that is probably fine to put a hold on the gentamicin he states his burns and stings when he applies it and we maybe be able to just use this intermittently when things become more irritated. Other than that the good news is I did review his MRI which was performed on 11/28/17 and revealed that he has a negative MRI for abscess, osteomyelitis, or septic  joint. Obviously these were the issues that I was concerned with the possibility of and I'm pleased to find that there is no problems in that regard. I did also review Dr. Jarrett Ables note from infectious disease he discussed performed some lab work which apparently was done and then subsequently was going to consider the possibility of Keflex long-term for prevention of infection although this has not been prescribed as of yet. 12/09/17 on evaluation today patient appears to be doing a little bit worse in regard to his right medial and lateral malleolus ulcers. Fortunately the wound does not seem to be significantly worse although he does have a lot more drainage especially the lateral aspect he is having a lot more pain than what he has noted more recently. Fortunately there does not appear to be any evidence of systemic infection which is good news. Again he has seen Dr. Sampson Goon but he has not been placed on the potential Keflex which was recommended as a possibility at least yet. 12/17/17 unfortunately on evaluation today the patient's wound appears to be doing much worse. He has been performing the dressing changes as directed. Upon a review of notes in epic it does appear that Dr. Jarrett Ables office specifically sending Keflex for the patient on the 16th which was last Tuesday. With that being said the patient states that though this was sent into [facility] that [facility] stated they never received it and subsequently the patient never took anything. He tells me he has been in bed since Friday due to the increased pain and overall general malaise. No fevers, chills, nausea, or vomiting noted at this time. 02/25/18 on evaluation today patient appears to be doing rather well in regard to his bilateral right malleolus ulcers. He has been tolerating the dressing changes without complication. Currently there does not appear to be any evidence of infection. Overall I'm very pleased with the progress  that seems to been made up to this point. 03/11/18 on evaluation today patient actually appears to be doing very well in regard to his right ankle ulcers. He's been tolerating the dressing changes without complication. Fortunately there does not appear to be any evidence of sniffing infection I do believe that the gentamicin cream continues to be of benefit for him. Fortunately he is showing signs of healing in which time I see him. He also tells me he's having no pain. 03/25/18 on evaluation today patient actually appears to show signs of improvement especially in regard to the right lateral ankle ulcer. He  has been tolerating the dressing changes without complication. In general I'm very pleased with the progress that has been made up to this point. 04/08/18- He presents in follow up evaluation for right bimalleolar wounds; there is essentially no change. He admits to removing the dressing at night to allow the wounds to be "air out". We will continue with same treatment plan and he will follow up in two weeks 04/22/18 on evaluation today patient seems very frustrated with this situation currently as far as the ulcer on his right medial and lateral malleolus is concerned. He states this has been going on a very long time and obviously he does have a lot of bills as well is far as wound care is concerned. With that being said he tells me that he's been tolerating the dressing changes well although he did clarify that what he is doing is putting the medicine on in the morning after shower and then subsequently he takes the dressing off at night leaving it open to air. I previously asked him about this and I was not aware that he was doing this. Nonetheless that may be slowing things down a bit at this point. Fortunately there does not appear to be any evidence of severe infection although he does have some evidence of your theme is surrounding the wound bed. He is still using the gentamicin  cream. 05/06/18 on evaluation today patient actually presents for evaluation of his right medial malleolus ulcers. He's been tolerating the dressing changes without complication. With that being said it does appear that he is doing better compared to last evaluation. He's not having the pain like he did previous. He also states that blisters that he had coming up when I saw him last did resolve and ended up doing okay he did go to the ER they gave him some cream to put on it he's not sure what the name of the cream was. He was down in [facility] when he called me and I spoke with him previous. Nonetheless he states that they told him to put the medicine on his our due list. With that being said I'm not really 100% sure that the medicine had anything to do with this due to the fact that again he was having the blisters when he came into the office which is why we actually put them on the anabiotic I was concerned about him having an infection which was causing the blistering and increased pain in his extremity. Nonetheless this is something that we can definitely be cautious of in the future it was Bactrim [name], [name] that we had him on. Overall I'm glad he is doing better. 05/20/18 on evaluation today patient actually appears to be doing fairly well in regard to his ulcer on the right lateral and medial malleolus locations. Both seem to be doing decently well he does have slough building up on the surface of the wound. He has been using the Hibiclense as I instructed him. Overall there's no evidence of significant infection which is good news. 06/02/18 on evaluation today patient actually appears to be doing excellent in regard to his right medial and lateral malleolus ulcers. He has been tolerating the dressing changes without complication. There does not appear to be any evidence of infection at this time which is great news. In general I feel like he has made great progress in  the past several weeks. He has good granulation noted over areas that have not had  good granulation previously and epithelialization even more so in several areas. Both wounds measured smaller. 06/17/18 an evaluation today patient actually appears to be doing excellent in regard to his right lateral and medial malleolus ulcers. He has been tolerating the dressing changes without complication fortunately the Hydrofera Blue Dressing to be doing excellent for him. He's actually making progress each time I see him. 07/08/18 on evaluation today patient's wounds on the right medial and lateral malleolus or region show signs of improvement currently. He has been tolerating the dressing changes without complication at this time. He does feel like the [facility] Dressing has been of benefit for him. No fevers, chills, nausea, or vomiting noted at this time. 07/22/18 and evaluation today patient actually appears to be doing excellent in regard to his right medial and lateral malleolus ulcers. He's been tolerating the dressing changes without complication the Hydrofera Blue Dressing years be excellent for him. Overall I'm extremely pleased in this regard. 08/05/18 on evaluation today patient's right lateral and medial malleolus ulcers appear to be doing well at this point. There does not appear to be any evidence of infection which is good news. Overall he does have some Slough noted which is going to require sharp debridement but other than this I see no current issues 08/19/18 on evaluation today patient actually appears to be doing better yet again in regard to his lower Trinity ulcer on the right medial and lateral malleolus regions. He is having no pain and no significant signs of infection which is great news. Overall things seem to be progressing quite nicely which is great news. No fevers, chills, nausea, or vomiting noted at this time. 09/09/18 on evaluation today patient appears to be doing very  well in regard to his medial and lateral ankle ulcers. He's been tolerating the dressing changes without complication. Fortunately there is no evidence of infection at this time which is good news. No fevers, chills, nausea, or vomiting noted at this time. 09/23/18 on evaluation today patient appears to be doing rather well in regard to his right medial and lateral ankle ulcers. He is been tolerating the dressing changes and still things continue to do excellent. Overall very pleased with the progress and there's no signs of infection today. 10/07/18 on evaluation today patient appears to be doing very well in regard to his right medial and lateral ankle ulcers. He's been tolerating the dressing changes without complication. Fortunately there's no signs of infection at this time. Overall the patient states that he is very pleased with how things seem to be progressing. 10/21/18 on evaluation today patient actually appears to be doing very well in regard to his right medial and lateral malleolus ulcers. He's been tolerating the dressing changes without complication overall I'm very pleased with how things seem to be progressing. There's no signs of infection. 11/04/18 on evaluation today patient actually appears to be feeling very well at this point. The medial wound bed actually show signs of improvement and looks to be doing very well. The lateral wing then actually is not want to touch with this show signs of increased drainage for some more Slough buildup. He's also having more pain which he states started last night. Nonetheless I'm concerned in this regard about the possibility of infection. No fevers, chills, nausea, or vomiting noted at this time. He has been since August 2019 since he last was pretty for infection. 12/16/18 on evaluation today patient appears to be doing very well in regard to  his medial and lateral malleolus ulcers. It has been a little over a month since I've last seen him  secondary to family issues that he's had as well as issues with the coronavirus pandemic. Nonetheless he has continued to follow the directions for dressing changes in the interim since I last saw and and appears to be doing excellent. In fact his medial ulcer seems significantly smaller the lateral is also smaller but again it was a larger wound to begin with. Overall I do feel like he's making good progress however. [name], CHOYCE ([id number]) 01/13/19 on evaluation today patient actually appears to be doing very well in regard to his ankle ulcers. The medial seems to be almost completely healed. The lateral is doing much better even compared to last time I saw him. I'm very pleased overall with how things seem to be progressing. The patient is likewise extremely happy. 01/27/19 on evaluation today patient appears to be doing very well in regard to his lower extremity ulcers. He is been tolerating the dressing changes without complication. Fortunately he seems to be making good progress. Overall been very pleased over the past several visits with the way the wound seem to be closing in quite nicely. There's no sign of active infection at this time. 02/10/19 on evaluation today patient appears to be doing excellent in regard to both wounds on his right lower extremity. He's been tolerating the dressing changes without complication. Fortunately there's no signs of active infection at this time. Both forms are significantly smaller especially the medial but even the lateral has shown signs of excellent progress in fact it separated into really to wounds there were still measurements as one for the time being that may change in the next visit or so. Patient History Information obtained from Patient. Social History Former smoker - 10 years ago, Marital Status - Married, Alcohol Use - Moderate, Drug Use - No History, Caffeine Use - Never. Medical History Eyes Patient has history of Cataracts -  removed Denies history of Glaucoma, Optic Neuritis Ear/Nose/Mouth/Throat Denies history of Chronic sinus problems/congestion Hematologic/Lymphatic Denies history of Anemia, Hemophilia, Human Immunodeficiency Virus, Lymphedema, Sickle Cell Disease Respiratory Patient has history of Chronic Obstructive Pulmonary Disease (COPD) Denies history of Aspiration, Asthma, Pneumothorax, Sleep Apnea, Tuberculosis Cardiovascular Denies history of Angina, Arrhythmia, Congestive Heart Failure, Coronary Artery Disease, Deep Vein Thrombosis, Hypertension, Hypotension, Myocardial Infarction, Peripheral Arterial Disease, Peripheral Venous Disease, Phlebitis, Vasculitis Gastrointestinal Denies history of Cirrhosis , Colitis, Crohn s, Hepatitis A, Hepatitis B, Hepatitis C Endocrine Denies history of Type I Diabetes, Type II Diabetes Genitourinary Denies history of End Stage Renal Disease Immunological Denies history of Lupus Erythematosus, Raynaud s Integumentary (Skin) Denies history of History of Burn, History of pressure wounds Musculoskeletal Patient has history of Osteoarthritis Denies history of Gout, Rheumatoid Arthritis, Osteomyelitis Neurologic Denies history of Dementia, Neuropathy, Quadriplegia, Paraplegia, Seizure Disorder Oncologic Denies history of Received Chemotherapy, Received Radiation Psychiatric Denies history of Anorexia/bulimia, Confinement Anxiety Medical And Surgical History Notes CAMEO, SHEWELL ([id number]) Constitutional Symptoms (General Health) Vein Filter (groin area); CODA; H/O Blood Clots; pulmonary hypertensive arterial disease Review of Systems (ROS) Constitutional Symptoms (General Health) Denies complaints or symptoms of Fatigue, Fever, Chills, Marked Weight Change. Respiratory Denies complaints or symptoms of Chronic or frequent coughs, Shortness of Breath. Cardiovascular Denies complaints or symptoms of Chest pain, LE edema. Psychiatric Denies  complaints or symptoms of Anxiety, Claustrophobia. Objective Constitutional Well-nourished and well-hydrated in no acute distress. Vitals Time Taken: 8:13 AM, Height: 76 in,  Weight: 238 lbs, BMI: 29, Temperature: 98.3 F, Pulse: 62 bpm, Respiratory Rate: 16 breaths/min, Blood Pressure: 167/77 mmHg. Respiratory normal breathing without difficulty. General Notes: Patient's wound bed currently shows excellent improvement there's absolutely no signs of infection everything looks to be doing excellent his wife is taking great care of this. Overall very pleased with the progress and how things appear at this time. He has no significant swelling. Integumentary (Hair, Skin) Wound #1 status is Open. Original cause of wound was Gradually Appeared. The wound is located on the Right,Lateral Malleolus. The wound measures 3cm length x 2.5cm width x 0.2cm depth; 5.89cm^2 area and 1.178cm^3 volume. There is Fat Layer (Subcutaneous Tissue) Exposed exposed. There is no tunneling or undermining noted. There is a medium amount of serous drainage noted. The wound margin is flat and intact. There is small (1-33%) pink granulation within the wound bed. There is a large (67-100%) amount of necrotic tissue within the wound bed including Adherent Slough. Wound #2 status is Open. Original cause of wound was Gradually Appeared. The wound is located on the Right,Medial Malleolus. The wound measures 0.5cm length x 0.2cm width x 0.1cm depth; 0.079cm^2 area and 0.008cm^3 volume. There is Fat Layer (Subcutaneous Tissue) Exposed exposed. There is no tunneling or undermining noted. There is a medium amount of serous drainage noted. The wound margin is flat and intact. There is small (1-33%) pink granulation within the wound bed. There is a medium (34-66%) amount of necrotic tissue within the wound bed including Adherent Slough. 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 There was a Excisional Skin/Subcutaneous Tissue Debridement with a total area of 7.5 sq cm performed by STONE III, Maryclaire Stoecker E., PA-C. With the following instrument(s): Curette to remove Viable and Non-Viable tissue/material. Material removed includes Subcutaneous Tissue, Slough, and Biofilm after achieving pain control using Lidocaine 4% Topical Solution. No specimens were taken. A time out was conducted at 08:28, prior to the start of the procedure. A Minimum amount of bleeding was controlled with Pressure. The procedure was tolerated well with a pain level of 0 throughout and a pain level of 0 following the procedure. Post Debridement Measurements: 3cm length x 2.5cm width x 0.3cm depth; 1.767cm^3 volume. Character of Wound/Ulcer Post Debridement is improved. Severity of Tissue Post Debridement is: Fat layer exposed. Post procedure Diagnosis Wound #1: Same as Pre-Procedure Plan Wound Cleansing: Wound #1 Right,Lateral Malleolus: Clean wound with Normal Saline. May Shower, gently pat wound dry prior to applying new dressing. - wash with hibiclens daily and leave on 5 mins Wound #2 Right,Medial Malleolus: Clean wound with Normal Saline. May Shower, gently pat wound dry prior to applying new dressing. - wash with hibiclens daily and leave on 5 mins Anesthetic (add to Medication List): Wound #1 Right,Lateral Malleolus: Topical Lidocaine 4% cream applied to wound bed prior to debridement (In Clinic Only). Wound #2 Right,Medial  Malleolus: Topical Lidocaine 4% cream applied to wound bed prior to debridement (In Clinic Only). Primary Wound Dressing: Wound #1 Right,Lateral Malleolus: Hydrafera Blue Ready Transfer Other: - Gentamicin cream to wound bed Wound #2 Right,Medial Malleolus:  Hydrafera Blue Ready Transfer Other: - Gentamicin cream to wound bed Secondary Dressing: Wound #1 Right,Lateral Malleolus: Dry Gauze Conform/Kerlix Wound #2 Right,Medial Malleolus: Dry Gauze Conform/Kerlix Allcock, Ido C. ([id number]) Dressing Change Frequency: Wound #1 Right,Lateral Malleolus: Change dressing every other day. Wound #2 Right,Medial Malleolus: Change dressing every other day. Follow-up Appointments: Return Appointment in 1 month Edema Control: Wound #1 Right,Lateral Malleolus: Elevate legs to the level of the heart and pump ankles as often as possible Wound #2 Right,Medial Malleolus: Elevate legs to the level of the heart and pump ankles as often as possible Additional Orders / Instructions: Wound #1 Right,Lateral Malleolus: Increase protein intake. Wound #2 Right,Medial Malleolus: Increase protein intake. My suggestion at this point is gonna be that we go ahead and continue with the above wound care measures for the next week and the patient is in agreement with plan. I did perform sharp agreement that away [facility] and biofilm from the surface of the wound which the patient tolerated without complication. Overall I feel like he's on the right path towards healing completely. Healed. I will see him back for reevaluation one months time as he wants to stretch this out since is doing well I'm okay with that. Please see above for specific wound care orders. We will see patient for re-evaluation in 1 week(s) here in the clinic. If anything worsens or changes patient will contact our office for additional recommendations. Electronic Signature(s) Signed: 02/11/2019 6:29:07 AM By: Lenda Kelp PA-C Entered  By: Lenda Kelp on 02/10/2019 08:35:20 [name] ([id number]) -------------------------------------------------------------------------------- ROS/PFSH Details Patient Name: [name] Date of Service: 02/10/2019 8:00 AM Medical Record Number: [id number] Patient Account Number: [id number] Date of Birth/Sex: 1946-05-16 (73 y.o. M) Treating RN: Curtis Sites Primary Care Provider: [name] Other Clinician: Referring Provider: [name] Treating Provider/Extender: STONE III, Diella Gillingham Weeks in Treatment: 160 Information Obtained From Patient Constitutional Symptoms (General Health) Complaints and Symptoms: Negative for: Fatigue; Fever; Chills; Marked Weight Change Medical History: Past Medical History Notes: Vein Filter (groin area); CODA; H/O Blood Clots; pulmonary hypertensive arterial disease Respiratory Complaints and Symptoms: Negative for: Chronic or frequent coughs; Shortness of Breath Medical History: Positive for: Chronic Obstructive Pulmonary Disease (COPD) Negative for: Aspiration; Asthma; Pneumothorax; Sleep Apnea; Tuberculosis Cardiovascular Complaints and Symptoms: Negative for: Chest pain; LE edema Medical History: Negative for: Angina; Arrhythmia; Congestive Heart Failure; Coronary Artery Disease; Deep Vein Thrombosis; Hypertension; Hypotension; Myocardial Infarction; Peripheral Arterial Disease; Peripheral Venous Disease; Phlebitis; Vasculitis Psychiatric Complaints and Symptoms: Negative for: Anxiety; Claustrophobia Medical History: Negative for: Anorexia/bulimia; Confinement Anxiety Eyes Medical History: Positive for: Cataracts - removed Negative for: Glaucoma; Optic Neuritis Ear/Nose/Mouth/Throat Medical History: Negative for: Chronic sinus problems/congestion LEOMAR, WESTBERG ([id number]) Hematologic/Lymphatic Medical History: Negative for: Anemia; Hemophilia; Human Immunodeficiency Virus; Lymphedema; Sickle Cell  Disease Gastrointestinal Medical History: Negative for: Cirrhosis ; Colitis; Crohnos; Hepatitis A; Hepatitis B; Hepatitis C Endocrine Medical History: Negative for: Type I Diabetes; Type II Diabetes Genitourinary Medical History: Negative for: End Stage Renal Disease Immunological Medical History: Negative for: Lupus Erythematosus; Raynaudos Integumentary (Skin) Medical History: Negative for: History of Burn; History of pressure wounds Musculoskeletal Medical History: Positive for: Osteoarthritis Negative for: Gout; Rheumatoid Arthritis; Osteomyelitis Neurologic Medical History: Negative for: Dementia; Neuropathy; Quadriplegia; Paraplegia; Seizure Disorder Oncologic Medical History: Negative for: Received Chemotherapy; Received Radiation HBO Extended History Items Eyes: Cataracts Immunizations Pneumococcal Vaccine: Received Pneumococcal Vaccination: No Implantable Devices No devices added Family and Social History Former smoker - 10 years ago; Marital Status - Married; Alcohol  Use: Moderate; Drug Use: No History; Caffeine Use: Never MARLO, GOODRICH ([id number]) Physician Affirmation I have reviewed and agree with the above information. Electronic Signature(s) Signed: 02/10/2019 4:26:15 PM By: Curtis Sites Signed: 02/11/2019 6:29:07 AM By: Lenda Kelp PA-C Entered By: Lenda Kelp on 02/10/2019 08:34:35 [name] ([id number]) -------------------------------------------------------------------------------- SuperBill Details Patient Name: [name] Date of Service: 02/10/2019 Medical Record Number: [id number] Patient Account Number: [id number] Date of Birth/Sex: 25-Jan-1946 (73 y.o. 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 (mechanical) of artificial skin graft and decellularized allodermis, sequela T81.31XS Disruption of external operation (surgical) wound, not elsewhere classified, sequela Facility Procedures CPT4 Code: 96295284 Description: 11042 - DEB SUBQ TISSUE 20 SQ CM/< ICD-10 Diagnosis Description L97.312 Non-pressure chronic ulcer of right ankle with fat layer ex Modifier: posed Quantity: 1 Physician Procedures CPT4 Code: 1324401 Description: 11042 - WC PHYS SUBQ TISS 20 SQ CM ICD-10 Diagnosis Description L97.312 Non-pressure chronic ulcer of right ankle with fat layer ex Modifier: posed Quantity: 1 Electronic Signature(s) Signed: 02/11/2019 6:29:07 AM By: Lenda Kelp PA-C Entered By: Lenda Kelp on 02/10/2019 08:35:35

## 2019-02-11 NOTE — Progress Notes (Addendum)
TAURUS, ALAMO ([id number]) Visit Report for 02/10/2019 Arrival Information Details Patient Name: [name], [name]. Date of Service: 02/10/2019 8:00 AM Medical Record Number: [id number] Patient Account Number: [id number] Date of Birth/Sex: 11/08/1945 (73 y.o. M) Treating RN: Army Melia Primary Care Perlie Scheuring: Delight Stare Other Clinician: Referring Dakiya Puopolo: Delight Stare Treating Kepler Mccabe/Extender: STONE III, HOYT Weeks in Treatment: 160 Visit Information History Since Last Visit Added or deleted any medications: No Patient Arrived: Ambulatory Any new allergies or adverse reactions: No Arrival Time: 08:12 Had a fall or experienced change in No Accompanied By: self activities of daily living that may affect Transfer Assistance: None risk of falls: Patient Requires Transmission-Based No Signs or symptoms of abuse/neglect since last visito No Precautions: Hospitalized since last visit: No Patient Has Alerts: Yes Has Dressing in Place as Prescribed: Yes Patient Alerts: Patient on Blood Pain Present Now: No Thinner Electronic Signature(s) Signed: 02/10/2019 9:57:52 AM By: Army Melia Entered By: Army Melia on 02/10/2019 08:13:47 [name] ([id number]) -------------------------------------------------------------------------------- Encounter Discharge Information Details Patient Name: [name] Date of Service: 02/10/2019 8:00 AM Medical Record Number: [id number] Patient Account Number: [id number] Date of Birth/Sex: 12-19-1945 (73 y.o. M) Treating RN: Cornell Barman Primary Care Meshia Rau: Delight Stare Other Clinician: Referring Xadrian Craighead: Delight Stare Treating Hamzeh Tall/Extender: Melburn Hake, HOYT Weeks in Treatment: 160 Encounter Discharge Information Items Post Procedure Vitals Discharge Condition: Stable Temperature (F): 98.3 Ambulatory Status: Ambulatory Pulse (bpm): 62 Discharge Destination: Home Respiratory Rate (breaths/min): 16 Transportation:  Private Auto Blood Pressure (mmHg): 167/77 Accompanied By: self Schedule Follow-up Appointment: Yes Clinical Summary of Care: Electronic Signature(s) Signed: 02/10/2019 11:56:50 AM By: Gretta Cool, BSN, RN, CWS, Kim RN, BSN Entered By: Gretta Cool, BSN, RN, CWS, Kim on 02/10/2019 08:45:25 [name] ([id number]) -------------------------------------------------------------------------------- Lower Extremity Assessment Details Patient Name: [name] Date of Service: 02/10/2019 8:00 AM Medical Record Number: [id number] Patient Account Number: [id number] Date of Birth/Sex: September 06, 1945 (73 y.o. M) Treating RN: Army Melia Primary Care Mccall Will: Delight Stare Other Clinician: Referring Errick Salts: Delight Stare Treating Macyn Shropshire/Extender: STONE III, HOYT Weeks in Treatment: 160 Edema Assessment Assessed: [Left: No] [Right: No] Edema: [Left: N] [Right: o] Vascular Assessment Pulses: Dorsalis Pedis Palpable: [Right:Yes] Electronic Signature(s) Signed: 02/10/2019 9:57:52 AM By: Army Melia Entered By: Army Melia on 02/10/2019 08:19:55 [name] ([id number]) -------------------------------------------------------------------------------- Multi Wound Chart Details Patient Name: [name] Date of Service: 02/10/2019 8:00 AM Medical Record Number: [id number] Patient Account Number: [id number] Date of Birth/Sex: [birth year] (73 y.o. M) Treating RN: Montey Hora Primary Care Zhaire Locker: Delight Stare Other Clinician: Referring Zephaniah Lubrano: Delight Stare Treating Roizy Harold/Extender: STONE III, HOYT Weeks in Treatment: 160 Vital Signs Height(in): 76 Pulse(bpm): 62 Weight(lbs): 238 Blood Pressure(mmHg): 167/77 Body Mass Index(BMI): 29 Temperature(F): 98.3 Respiratory Rate 16 (breaths/min): Photos: [1:No Photos] [2:No Photos] [N/A:N/A] Wound Location: [1:Right Malleolus - Lateral] [2:Right, Medial Malleolus] [N/A:N/A] Wounding Event: [1:Gradually Appeared]  [2:Gradually Appeared] [N/A:N/A] Primary Etiology: [1:Venous Leg Ulcer] [2:Venous Leg Ulcer] [N/A:N/A] Comorbid History: [1:Cataracts, Chronic Obstructive Cataracts, Chronic Obstructive N/A Pulmonary Disease (COPD), Pulmonary Disease (COPD), Osteoarthritis] [2:Osteoarthritis] Date Acquired: [1:08/11/2015] [2:01/30/2016] [N/A:N/A] Weeks of Treatment: [1:160] [2:158] [N/A:N/A] Wound Status: [1:Open] [2:Open] [N/A:N/A] Clustered Wound: [1:No] [2:Yes] [N/A:N/A] Measurements L x W x D [1:3x2.5x0.2] [2:0.5x0.2x0.1] [N/A:N/A] (cm) Area (cm) : [1:5.89] [6:0.630] [N/A:N/A] Volume (cm) : [1:1.178] [2:0.008] [N/A:N/A] % Reduction in Area: [1:65.90%] [2:99.30%] [N/A:N/A] % Reduction in Volume: [1:77.30%] [2:99.30%] [N/A:N/A] Classification: [1:Full Thickness Without Exposed Support Structures] [2:Full Thickness Without Exposed Support Structures] [N/A:N/A] Exudate Amount: [1:Medium] [2:Medium] [N/A:N/A] Exudate Type: [1:Serous] [2:Serous] [N/A:N/A] Exudate  Color: [1:amber] [2:amber] [N/A:N/A] Wound Margin: [1:Flat and Intact] [2:Flat and Intact] [N/A:N/A] Granulation Amount: [1:Small (1-33%)] [2:Small (1-33%)] [N/A:N/A] Granulation Quality: [1:Pink] [2:Pink] [N/A:N/A] Necrotic Amount: [1:Large (67-100%)] [2:Medium (34-66%)] [N/A:N/A] Exposed Structures: [1:Fat Layer (Subcutaneous Tissue) Exposed: Yes Fascia: No Tendon: No Muscle: No Joint: No Bone: No Small (1-33%)] [2:Fat Layer (Subcutaneous Tissue) Exposed: Yes Fascia: No Tendon: No Muscle: No Joint: No Bone: No Small (1-33%)] [N/A:N/A N/A] Treatment Notes [name], [name] ([id number]) Electronic Signature(s) Signed: 02/10/2019 4:26:15 PM By: Curtis Sitesorthy, Joanna Entered By: Curtis Sitesorthy, Joanna on 02/10/2019 08:27:30 [name], [name] ([id number]) -------------------------------------------------------------------------------- Multi-Disciplinary Care Plan Details Patient Name: [name], [name] Date of Service: 02/10/2019 8:00 AM Medical Record  Number: [id number] Patient Account Number: [id number] Date of Birth/Sex: 05/19/1946 [age] y.o. M) Treating RN: Curtis Sitesorthy, Joanna Primary Care Kyle Stansell: Darreld McleanMILES, LINDA Other Clinician: Referring Meeya Goldin: Darreld McleanMILES, LINDA Treating Trinna Kunst/Extender: Linwood DibblesSTONE III, HOYT Weeks in Treatment: 160 Active Inactive Electronic Signature(s) Signed: 04/10/2019 1:18:14 PM By: Elliot GurneyWoody, BSN, RN, CWS, Kim RN, BSN Signed: 04/24/2019 1:03:09 PM By: Curtis Sitesorthy, Joanna Previous Signature: 02/10/2019 4:26:15 PM Version By: Curtis Sitesorthy, Joanna Entered By: Elliot GurneyWoody, BSN, RN, CWS, Kim on 04/10/2019 13:18:13 [name], Marvens C. ([id number]) -------------------------------------------------------------------------------- Pain Assessment Details Patient Name: [name], [name] Date of Service: 02/10/2019 8:00 AM Medical Record Number: [id number] Patient Account Number: [id number] Date of Birth/Sex: 05/19/1946 [age] y.o. M) Treating RN: Rodell PernaScott, Dajea Primary Care Lary Eckardt: Darreld McleanMILES, LINDA Other Clinician: Referring Aulani Shipton: Darreld McleanMILES, LINDA Treating Aoife Bold/Extender: STONE III, HOYT Weeks in Treatment: 160 Active Problems Location of Pain Severity and Description of Pain Patient Has Paino No Site Locations Pain Management and Medication Current Pain Management: Electronic Signature(s) Signed: 02/10/2019 9:57:52 AM By: Rodell PernaScott, Dajea Entered By: Rodell PernaScott, Dajea on 02/10/2019 08:13:53 [name], [name] ([id number]) -------------------------------------------------------------------------------- Patient/Caregiver Education Details Patient Name: [name], [name] Date of Service: 02/10/2019 8:00 AM Medical Record Number: [id number] Patient Account Number: [id number] Date of Birth/Gender: 05/19/1946 [age] y.o. M) Treating RN: Curtis Sitesorthy, Joanna Primary Care Physician: Darreld McleanMILES, LINDA Other Clinician: Referring Physician: Darreld McleanMILES, LINDA Treating Physician/Extender: Linwood DibblesSTONE III, HOYT Weeks in Treatment: 160 Education Assessment Education Provided  To: Patient Education Topics Provided Wound/Skin Impairment: Handouts: Other: wound care as ordered Methods: Demonstration, Explain/Verbal Responses: State content correctly Electronic Signature(s) Signed: 02/10/2019 4:26:15 PM By: Curtis Sitesorthy, Joanna Entered By: Curtis Sitesorthy, Joanna on 02/10/2019 08:33:20 [name], [name] ([id number]) -------------------------------------------------------------------------------- Wound Assessment Details Patient Name: [name], [name] Date of Service: 02/10/2019 8:00 AM Medical Record Number: [id number] Patient Account Number: [id number] Date of Birth/Sex: 05/19/1946 [age] y.o. M) Treating RN: Rodell PernaScott, Dajea Primary Care Gayla Benn: Darreld McleanMILES, LINDA Other Clinician: Referring Jerian Morais: Darreld McleanMILES, LINDA Treating Zarra Geffert/Extender: STONE III, HOYT Weeks in Treatment: 160 Wound Status Wound Number: 1 Primary Venous Leg Ulcer Etiology: Wound Location: Right Malleolus - Lateral Wound Open Wounding Event: Gradually Appeared Status: Date Acquired: 08/11/2015 Comorbid Cataracts, Chronic Obstructive Pulmonary Weeks Of Treatment: 160 History: Disease (COPD), Osteoarthritis Clustered Wound: No Photos Photo Uploaded By: Rodell PernaScott, Dajea on 02/10/2019 09:11:28 Wound Measurements Length: (cm) 3 Width: (cm) 2.5 Depth: (cm) 0.2 Area: (cm) 5.89 Volume: (cm) 1.178 % Reduction in Area: 65.9% % Reduction in Volume: 77.3% Epithelialization: Small (1-33%) Tunneling: No Undermining: No Wound Description Full Thickness Without Exposed Support Foul Odo Classification: Structures Slough/F Wound Margin: Flat and Intact Exudate Medium Amount: Exudate Type: Serous Exudate Color: amber r After Cleansing: No ibrino Yes Wound Bed Granulation Amount: Small (1-33%) Exposed Structure Granulation Quality: Pink Fascia Exposed: No Necrotic Amount: Large (67-100%) Fat Layer (Subcutaneous Tissue) Exposed: Yes Necrotic Quality: Adherent Slough Tendon Exposed: No Muscle Exposed:  No Joint Exposed: No Bone Exposed: No [name], [name] ([id number]) Electronic Signature(s) Signed: 02/10/2019 9:57:52 AM By: Rodell PernaScott, Dajea Entered By: Rodell PernaScott, Dajea on 02/10/2019 08:19:13 [name], [name] ([id number]) -------------------------------------------------------------------------------- Wound Assessment Details Patient Name: [name], Demontay C. Date of Service: 02/10/2019 8:00 AM Medical Record Number: [id number] Patient Account Number: [id number] Date of Birth/Sex: [birth year] [age] y.o. M) Treating RN: Curtis Sitesorthy, Joanna Primary Care Emalina Dubreuil: Darreld McleanMILES, LINDA Other Clinician: Referring Viet Kemmerer: Darreld McleanMILES, LINDA Treating Francis [name]/Extender: STONE III, HOYT Weeks in Treatment: 160 Wound Status Wound Number: 2 Primary Venous Leg Ulcer Etiology: Wound Location: Right, Medial Malleolus Wound Open Wounding Event: Gradually Appeared Status: Date Acquired: 01/30/2016 Comorbid Cataracts, Chronic Obstructive Pulmonary Weeks Of Treatment: 158 History: Disease (COPD), Osteoarthritis Clustered Wound: Yes Photos Photo Uploaded By: Rodell PernaScott, Dajea on 02/10/2019 09:11:29 Wound Measurements Length: (cm) 0.5 Width: (cm) 0.2 Depth: (cm) 0.1 Area: (cm) 0.079 Volume: (cm) 0.008 % Reduction in Area: 99.3% % Reduction in Volume: 99.3% Epithelialization: Small (1-33%) Tunneling: No Undermining: No Wound Description Full Thickness Without Exposed Support Foul Odo Classification: Structures Slough/F Wound Margin: Flat and Intact Exudate Medium Amount: Exudate Type: Serous Exudate Color: amber r After Cleansing: No ibrino Yes Wound Bed Granulation Amount: Small (1-33%) Exposed Structure Granulation Quality: Pink Fascia Exposed: No Necrotic Amount: Medium (34-66%) Fat Layer (Subcutaneous Tissue) Exposed: Yes Necrotic Quality: Adherent Slough Tendon Exposed: No Muscle Exposed: No Joint Exposed: No Bone Exposed: No [name], [name] ([id number]) Electronic Signature(s) Signed:  02/10/2019 4:26:15 PM By: Curtis Sitesorthy, Joanna Entered By: Curtis Sitesorthy, Joanna on 02/10/2019 08:26:45 [name], [name] ([id number]) -------------------------------------------------------------------------------- Vitals Details Patient Name: [name], [name] Date of Service: 02/10/2019 8:00 AM Medical Record Number: [id number] Patient Account Number: [id number] Date of Birth/Sex: [birth year] [age] y.o. M) Treating RN: Rodell PernaScott, Dajea Primary Care Jasmyne Lodato: Darreld McleanMILES, LINDA Other Clinician: Referring Dailey Buccheri: Darreld McleanMILES, LINDA Treating Aleksander Edmiston/Extender: STONE III, HOYT Weeks in Treatment: 160 Vital Signs Time Taken: 08:13 Temperature (F): 98.3 Height (in): 76 Pulse (bpm): 62 Weight (lbs): 238 Respiratory Rate (breaths/min): 16 Body Mass Index (BMI): 29 Blood Pressure (mmHg): 167/77 Reference Range: 80 - 120 mg / dl Electronic Signature(s) Signed: 02/10/2019 9:57:52 AM By: Rodell PernaScott, Dajea Entered By: Rodell PernaScott, Dajea on 02/10/2019 08:14:08

## 2019-03-10 ENCOUNTER — Ambulatory Visit: Payer: Medicare HMO | Admitting: Internal Medicine

## 2019-08-26 ENCOUNTER — Encounter (HOSPITAL_COMMUNITY): Payer: Self-pay

## 2019-08-26 ENCOUNTER — Emergency Department (HOSPITAL_COMMUNITY): Payer: Medicare HMO

## 2019-08-26 ENCOUNTER — Other Ambulatory Visit: Payer: Self-pay

## 2019-08-26 ENCOUNTER — Inpatient Hospital Stay (HOSPITAL_COMMUNITY)
Admission: EM | Admit: 2019-08-26 | Discharge: 2019-08-30 | DRG: 177 | Disposition: A | Discharge status: Home / Self Care | Payer: Medicare HMO | Attending: Internal Medicine | Admitting: Internal Medicine

## 2019-08-26 DIAGNOSIS — L97909 Non-pressure chronic ulcer of unspecified part of unspecified lower leg with unspecified severity: Secondary | ICD-10-CM | POA: Diagnosis present

## 2019-08-26 DIAGNOSIS — I739 Peripheral vascular disease, unspecified: Secondary | ICD-10-CM | POA: Diagnosis present

## 2019-08-26 DIAGNOSIS — Z86718 Personal history of other venous thrombosis and embolism: Secondary | ICD-10-CM

## 2019-08-26 DIAGNOSIS — J9691 Respiratory failure, unspecified with hypoxia: Secondary | ICD-10-CM | POA: Diagnosis present

## 2019-08-26 DIAGNOSIS — J189 Pneumonia, unspecified organism: Secondary | ICD-10-CM | POA: Insufficient documentation

## 2019-08-26 DIAGNOSIS — U071 COVID-19: Principal | ICD-10-CM | POA: Diagnosis present

## 2019-08-26 DIAGNOSIS — R3915 Urgency of urination: Secondary | ICD-10-CM | POA: Diagnosis present

## 2019-08-26 DIAGNOSIS — J069 Acute upper respiratory infection, unspecified: Secondary | ICD-10-CM | POA: Diagnosis not present

## 2019-08-26 DIAGNOSIS — Z9081 Acquired absence of spleen: Secondary | ICD-10-CM

## 2019-08-26 DIAGNOSIS — R7401 Elevation of levels of liver transaminase levels: Secondary | ICD-10-CM | POA: Diagnosis present

## 2019-08-26 DIAGNOSIS — I83013 Varicose veins of right lower extremity with ulcer of ankle: Secondary | ICD-10-CM | POA: Diagnosis present

## 2019-08-26 DIAGNOSIS — R739 Hyperglycemia, unspecified: Secondary | ICD-10-CM | POA: Diagnosis not present

## 2019-08-26 DIAGNOSIS — Z87891 Personal history of nicotine dependence: Secondary | ICD-10-CM | POA: Diagnosis not present

## 2019-08-26 DIAGNOSIS — R350 Frequency of micturition: Secondary | ICD-10-CM | POA: Diagnosis present

## 2019-08-26 DIAGNOSIS — J9601 Acute respiratory failure with hypoxia: Secondary | ICD-10-CM | POA: Diagnosis not present

## 2019-08-26 DIAGNOSIS — I2724 Chronic thromboembolic pulmonary hypertension: Secondary | ICD-10-CM | POA: Diagnosis present

## 2019-08-26 DIAGNOSIS — R7989 Other specified abnormal findings of blood chemistry: Secondary | ICD-10-CM | POA: Diagnosis present

## 2019-08-26 DIAGNOSIS — J1289 Other viral pneumonia: Secondary | ICD-10-CM

## 2019-08-26 DIAGNOSIS — E876 Hypokalemia: Secondary | ICD-10-CM | POA: Diagnosis present

## 2019-08-26 DIAGNOSIS — D72823 Leukemoid reaction: Secondary | ICD-10-CM | POA: Diagnosis present

## 2019-08-26 DIAGNOSIS — Z8249 Family history of ischemic heart disease and other diseases of the circulatory system: Secondary | ICD-10-CM

## 2019-08-26 DIAGNOSIS — E611 Iron deficiency: Secondary | ICD-10-CM | POA: Diagnosis present

## 2019-08-26 DIAGNOSIS — Z7901 Long term (current) use of anticoagulants: Secondary | ICD-10-CM

## 2019-08-26 DIAGNOSIS — L97311 Non-pressure chronic ulcer of right ankle limited to breakdown of skin: Secondary | ICD-10-CM | POA: Diagnosis present

## 2019-08-26 DIAGNOSIS — I272 Pulmonary hypertension, unspecified: Secondary | ICD-10-CM | POA: Diagnosis present

## 2019-08-26 DIAGNOSIS — J440 Chronic obstructive pulmonary disease with acute lower respiratory infection: Secondary | ICD-10-CM | POA: Diagnosis present

## 2019-08-26 DIAGNOSIS — Z79899 Other long term (current) drug therapy: Secondary | ICD-10-CM | POA: Diagnosis not present

## 2019-08-26 DIAGNOSIS — I83009 Varicose veins of unspecified lower extremity with ulcer of unspecified site: Secondary | ICD-10-CM | POA: Diagnosis present

## 2019-08-26 DIAGNOSIS — T380X5A Adverse effect of glucocorticoids and synthetic analogues, initial encounter: Secondary | ICD-10-CM | POA: Diagnosis not present

## 2019-08-26 DIAGNOSIS — D539 Nutritional anemia, unspecified: Secondary | ICD-10-CM

## 2019-08-26 DIAGNOSIS — J1282 Pneumonia due to coronavirus disease 2019: Secondary | ICD-10-CM | POA: Diagnosis not present

## 2019-08-26 DIAGNOSIS — I8290 Acute embolism and thrombosis of unspecified vein: Secondary | ICD-10-CM | POA: Diagnosis present

## 2019-08-26 LAB — CBC WITH DIFFERENTIAL/PLATELET
Abs Immature Granulocytes: 0.23 10*3/uL — ABNORMAL HIGH (ref 0.00–0.07)
Basophils Absolute: 0 10*3/uL (ref 0.0–0.1)
Basophils Relative: 0 %
Eosinophils Absolute: 0 10*3/uL (ref 0.0–0.5)
Eosinophils Relative: 0 %
HCT: 31.3 % — ABNORMAL LOW (ref 39.0–52.0)
Hemoglobin: 10.4 g/dL — ABNORMAL LOW (ref 13.0–17.0)
Immature Granulocytes: 1 %
Lymphocytes Relative: 8 %
Lymphs Abs: 1.4 10*3/uL (ref 0.7–4.0)
MCH: 33.8 pg (ref 26.0–34.0)
MCHC: 33.2 g/dL (ref 30.0–36.0)
MCV: 101.6 fL — ABNORMAL HIGH (ref 80.0–100.0)
Monocytes Absolute: 1.6 10*3/uL — ABNORMAL HIGH (ref 0.1–1.0)
Monocytes Relative: 10 %
Neutro Abs: 13.5 10*3/uL — ABNORMAL HIGH (ref 1.7–7.7)
Neutrophils Relative %: 81 %
Platelets: 461 10*3/uL — ABNORMAL HIGH (ref 150–400)
RBC: 3.08 MIL/uL — ABNORMAL LOW (ref 4.22–5.81)
RDW: 14 % (ref 11.5–15.5)
WBC: 16.8 10*3/uL — ABNORMAL HIGH (ref 4.0–10.5)
nRBC: 0.3 % — ABNORMAL HIGH (ref 0.0–0.2)

## 2019-08-26 LAB — COMPREHENSIVE METABOLIC PANEL
ALT: 26 U/L (ref 0–44)
AST: 47 U/L — ABNORMAL HIGH (ref 15–41)
Albumin: 3.4 g/dL — ABNORMAL LOW (ref 3.5–5.0)
Alkaline Phosphatase: 63 U/L (ref 38–126)
Anion gap: 13 (ref 5–15)
BUN: 17 mg/dL (ref 8–23)
CO2: 21 mmol/L — ABNORMAL LOW (ref 22–32)
Calcium: 8.7 mg/dL — ABNORMAL LOW (ref 8.9–10.3)
Chloride: 101 mmol/L (ref 98–111)
Creatinine, Ser: 1.07 mg/dL (ref 0.61–1.24)
GFR calc Af Amer: >60 mL/min (ref >60)
GFR calc non Af Amer: >60 mL/min (ref >60)
Glucose, Bld: 118 mg/dL — ABNORMAL HIGH (ref 70–99)
Potassium: 3.3 mmol/L — ABNORMAL LOW (ref 3.5–5.1)
Sodium: 135 mmol/L (ref 135–145)
Total Bilirubin: 2.1 mg/dL — ABNORMAL HIGH (ref 0.3–1.2)
Total Protein: 8.4 g/dL — ABNORMAL HIGH (ref 6.5–8.1)

## 2019-08-26 LAB — PROCALCITONIN: Procalcitonin: 0.82 ng/mL

## 2019-08-26 LAB — PROTIME-INR
INR: 1.1 (ref 0.8–1.2)
Prothrombin Time: 14.1 seconds (ref 11.4–15.2)

## 2019-08-26 LAB — CBG MONITORING, ED
Glucose-Capillary: 118 mg/dL — ABNORMAL HIGH (ref 70–99)
Glucose-Capillary: 134 mg/dL — ABNORMAL HIGH (ref 70–99)
Glucose-Capillary: 182 mg/dL — ABNORMAL HIGH (ref 70–99)

## 2019-08-26 LAB — URINALYSIS, ROUTINE W REFLEX MICROSCOPIC
Bacteria, UA: NONE SEEN
Bilirubin Urine: NEGATIVE
Glucose, UA: NEGATIVE mg/dL
Hgb urine dipstick: NEGATIVE
Ketones, ur: NEGATIVE mg/dL
Leukocytes,Ua: NEGATIVE
Nitrite: NEGATIVE
Protein, ur: 100 mg/dL — AB
Specific Gravity, Urine: 1.013 (ref 1.005–1.030)
pH: 6 (ref 5.0–8.0)

## 2019-08-26 LAB — POC SARS CORONAVIRUS 2 AG -  ED: SARS Coronavirus 2 Ag: NEGATIVE

## 2019-08-26 LAB — LACTIC ACID, PLASMA
Lactic Acid, Venous: 1.1 mmol/L (ref 0.5–1.9)
Lactic Acid, Venous: 1.2 mmol/L (ref 0.5–1.9)

## 2019-08-26 LAB — D-DIMER, QUANTITATIVE: D-Dimer, Quant: 1.93 ug/mL-FEU — ABNORMAL HIGH (ref 0.00–0.50)

## 2019-08-26 LAB — C-REACTIVE PROTEIN: CRP: 15.4 mg/dL — ABNORMAL HIGH (ref <1.0)

## 2019-08-26 LAB — ABO/RH: ABO/RH(D): O POS

## 2019-08-26 LAB — RESPIRATORY PANEL BY RT PCR (FLU A&B, COVID)
Influenza A by PCR: NEGATIVE
Influenza B by PCR: NEGATIVE
SARS Coronavirus 2 by RT PCR: POSITIVE — AB

## 2019-08-26 LAB — APTT: aPTT: 43 seconds — ABNORMAL HIGH (ref 24–36)

## 2019-08-26 LAB — FERRITIN: Ferritin: 1204 ng/mL — ABNORMAL HIGH (ref 24–336)

## 2019-08-26 LAB — FIBRINOGEN: Fibrinogen: 748 mg/dL — ABNORMAL HIGH (ref 210–475)

## 2019-08-26 LAB — SARS CORONAVIRUS 2 (TAT 6-24 HRS): SARS Coronavirus 2: POSITIVE — AB

## 2019-08-26 LAB — LACTATE DEHYDROGENASE: LDH: 336 U/L — ABNORMAL HIGH (ref 98–192)

## 2019-08-26 MED ORDER — FOLIC ACID 1 MG PO TABS
1.0000 mg | ORAL_TABLET | Freq: Every day | ORAL | Status: DC
  Administered 2019-08-26 – 2019-08-30 (×5): 1 mg via ORAL
  Filled 2019-08-26 (×5): qty 1

## 2019-08-26 MED ORDER — GUAIFENESIN ER 600 MG PO TB12
600.0000 mg | ORAL_TABLET | Freq: Two times a day (BID) | ORAL | Status: DC
  Administered 2019-08-26 – 2019-08-30 (×9): 600 mg via ORAL
  Filled 2019-08-26 (×9): qty 1

## 2019-08-26 MED ORDER — SODIUM CHLORIDE 0.9% FLUSH
3.0000 mL | Freq: Two times a day (BID) | INTRAVENOUS | Status: DC
  Administered 2019-08-26 – 2019-08-30 (×7): 3 mL via INTRAVENOUS

## 2019-08-26 MED ORDER — POTASSIUM CHLORIDE CRYS ER 20 MEQ PO TBCR
40.0000 meq | EXTENDED_RELEASE_TABLET | ORAL | Status: AC
  Administered 2019-08-26 (×2): 40 meq via ORAL
  Filled 2019-08-26: qty 2

## 2019-08-26 MED ORDER — SODIUM CHLORIDE 0.9% FLUSH
3.0000 mL | INTRAVENOUS | Status: DC | PRN
  Administered 2019-08-27: 3 mL via INTRAVENOUS

## 2019-08-26 MED ORDER — TRAZODONE HCL 50 MG PO TABS
50.0000 mg | ORAL_TABLET | Freq: Every evening | ORAL | Status: DC | PRN
  Administered 2019-08-28 – 2019-08-29 (×2): 50 mg via ORAL
  Filled 2019-08-26 (×2): qty 1

## 2019-08-26 MED ORDER — METHYLPREDNISOLONE SODIUM SUCC 125 MG IJ SOLR
125.0000 mg | Freq: Once | INTRAMUSCULAR | Status: AC
  Administered 2019-08-26: 125 mg via INTRAVENOUS
  Filled 2019-08-26: qty 2

## 2019-08-26 MED ORDER — ONDANSETRON HCL 4 MG/2ML IJ SOLN: 4.0000 mg | Freq: Four times a day (QID) | INTRAMUSCULAR | Status: DC | PRN

## 2019-08-26 MED ORDER — CEPHALEXIN 500 MG PO CAPS: 500.0000 mg | ORAL_CAPSULE | Freq: Two times a day (BID) | ORAL | Status: DC

## 2019-08-26 MED ORDER — INSULIN ASPART 100 UNIT/ML ~~LOC~~ SOLN
0.0000 [IU] | Freq: Every day | SUBCUTANEOUS | Status: DC
  Administered 2019-08-27: 2 [IU] via SUBCUTANEOUS
  Administered 2019-08-28: 3 [IU] via SUBCUTANEOUS
  Filled 2019-08-26: qty 1

## 2019-08-26 MED ORDER — SODIUM CHLORIDE 0.9 % IV SOLN
100.0000 mg | Freq: Every day | INTRAVENOUS | Status: AC
  Administered 2019-08-27 – 2019-08-29 (×3): 100 mg via INTRAVENOUS
  Filled 2019-08-26 (×4): qty 20

## 2019-08-26 MED ORDER — SODIUM CHLORIDE 0.9 % IV SOLN
250.0000 mL | INTRAVENOUS | Status: DC | PRN
  Administered 2019-08-28: 250 mL via INTRAVENOUS

## 2019-08-26 MED ORDER — SODIUM CHLORIDE 0.9 % IV SOLN
500.0000 mg | INTRAVENOUS | Status: DC
  Administered 2019-08-26 – 2019-08-30 (×5): 500 mg via INTRAVENOUS
  Filled 2019-08-26 (×6): qty 500

## 2019-08-26 MED ORDER — INSULIN ASPART 100 UNIT/ML ~~LOC~~ SOLN
0.0000 [IU] | Freq: Three times a day (TID) | SUBCUTANEOUS | Status: DC
  Administered 2019-08-27 – 2019-08-28 (×2): 1 [IU] via SUBCUTANEOUS
  Administered 2019-08-28: 2 [IU] via SUBCUTANEOUS
  Administered 2019-08-29 – 2019-08-30 (×2): 1 [IU] via SUBCUTANEOUS
  Administered 2019-08-30: 2 [IU] via SUBCUTANEOUS

## 2019-08-26 MED ORDER — HYDROCOD POLST-CPM POLST ER 10-8 MG/5ML PO SUER
5.0000 mL | Freq: Two times a day (BID) | ORAL | Status: DC | PRN
  Administered 2019-08-29: 5 mL via ORAL
  Filled 2019-08-26: qty 5

## 2019-08-26 MED ORDER — POLYETHYLENE GLYCOL 3350 17 G PO PACK: 17.0000 g | PACK | Freq: Every day | ORAL | Status: DC | PRN

## 2019-08-26 MED ORDER — ZINC SULFATE 220 (50 ZN) MG PO CAPS
220.0000 mg | ORAL_CAPSULE | Freq: Every day | ORAL | Status: DC
  Administered 2019-08-27 – 2019-08-30 (×4): 220 mg via ORAL
  Filled 2019-08-26 (×4): qty 1

## 2019-08-26 MED ORDER — ACETAMINOPHEN 325 MG PO TABS
650.0000 mg | ORAL_TABLET | Freq: Four times a day (QID) | ORAL | Status: DC | PRN
  Administered 2019-08-28 – 2019-08-30 (×5): 650 mg via ORAL
  Filled 2019-08-26 (×5): qty 2

## 2019-08-26 MED ORDER — METHYLPREDNISOLONE SODIUM SUCC 125 MG IJ SOLR
60.0000 mg | Freq: Two times a day (BID) | INTRAMUSCULAR | Status: DC
  Administered 2019-08-26 – 2019-08-30 (×8): 60 mg via INTRAVENOUS
  Filled 2019-08-26 (×8): qty 2

## 2019-08-26 MED ORDER — ALBUTEROL SULFATE HFA 108 (90 BASE) MCG/ACT IN AERS
2.0000 | INHALATION_SPRAY | Freq: Four times a day (QID) | RESPIRATORY_TRACT | Status: DC
  Administered 2019-08-26 – 2019-08-28 (×10): 2 via RESPIRATORY_TRACT
  Filled 2019-08-26 (×2): qty 6.7

## 2019-08-26 MED ORDER — ADULT MULTIVITAMIN W/MINERALS CH
1.0000 | ORAL_TABLET | Freq: Every day | ORAL | Status: DC
  Administered 2019-08-28 – 2019-08-30 (×3): 1 via ORAL
  Filled 2019-08-26 (×4): qty 1

## 2019-08-26 MED ORDER — GUAIFENESIN-DM 100-10 MG/5ML PO SYRP
10.0000 mL | ORAL_SOLUTION | ORAL | Status: DC | PRN
  Administered 2019-08-28: 10 mL via ORAL
  Filled 2019-08-26: qty 10

## 2019-08-26 MED ORDER — THIAMINE HCL 100 MG PO TABS
100.0000 mg | ORAL_TABLET | Freq: Every day | ORAL | Status: DC
  Administered 2019-08-27 – 2019-08-30 (×4): 100 mg via ORAL
  Filled 2019-08-26 (×4): qty 1

## 2019-08-26 MED ORDER — FOLIC ACID 1 MG PO TABS: 1.0000 mg | ORAL_TABLET | Freq: Every day | ORAL | Status: DC

## 2019-08-26 MED ORDER — ACETAMINOPHEN 650 MG RE SUPP: 650.0000 mg | Freq: Four times a day (QID) | RECTAL | Status: DC | PRN

## 2019-08-26 MED ORDER — SODIUM CHLORIDE 0.9 % IV SOLN
2.0000 g | INTRAVENOUS | Status: DC
  Administered 2019-08-26 – 2019-08-30 (×5): 2 g via INTRAVENOUS
  Filled 2019-08-26 (×5): qty 20

## 2019-08-26 MED ORDER — ASCORBIC ACID 500 MG PO TABS
500.0000 mg | ORAL_TABLET | Freq: Every day | ORAL | Status: DC
  Administered 2019-08-27 – 2019-08-30 (×4): 500 mg via ORAL
  Filled 2019-08-26 (×4): qty 1

## 2019-08-26 MED ORDER — SODIUM CHLORIDE 0.9 % IV SOLN
200.0000 mg | Freq: Once | INTRAVENOUS | Status: AC
  Administered 2019-08-26: 200 mg via INTRAVENOUS
  Filled 2019-08-26: qty 40

## 2019-08-26 MED ORDER — ONDANSETRON HCL 4 MG PO TABS: 4.0000 mg | ORAL_TABLET | Freq: Four times a day (QID) | ORAL | Status: DC | PRN

## 2019-08-26 MED ORDER — APIXABAN 5 MG PO TABS
5.0000 mg | ORAL_TABLET | Freq: Two times a day (BID) | ORAL | Status: DC
  Administered 2019-08-26 – 2019-08-30 (×9): 5 mg via ORAL
  Filled 2019-08-26 (×9): qty 1

## 2019-08-26 MED ORDER — ALBUTEROL SULFATE (2.5 MG/3ML) 0.083% IN NEBU: 2.5000 mg | INHALATION_SOLUTION | RESPIRATORY_TRACT | Status: DC | PRN

## 2019-08-26 MED ORDER — POTASSIUM CHLORIDE CRYS ER 20 MEQ PO TBCR
40.0000 meq | EXTENDED_RELEASE_TABLET | Freq: Once | ORAL | Status: AC
  Administered 2019-08-26: 40 meq via ORAL
  Filled 2019-08-26: qty 2

## 2019-08-26 MED ORDER — LORATADINE 10 MG PO TABS
10.0000 mg | ORAL_TABLET | Freq: Every day | ORAL | Status: DC
  Administered 2019-08-26 – 2019-08-30 (×5): 10 mg via ORAL
  Filled 2019-08-26 (×5): qty 1

## 2019-08-26 NOTE — ED Provider Notes (Signed)
[facility] EMERGENCY DEPARTMENT Provider Note   CSN: [id number] Arrival date & time: 08/26/19  0335   History Chief Complaint  Patient presents with  . Shortness of Breath    [name] is a 73 y.o. male.  The history is provided by the patient.  Shortness of Breath  He has history of peripheral vascular disease, DVT and is anticoagulated on apixaban comes in because of shortness of breath for the last 2 days.  He has had a nonproductive cough and subjective fever.  He denies chills or sweats.  He denies change in sense of smell or taste.  He denies rhinorrhea or sore throat.  There has been no nausea, vomiting, diarrhea.  He denies arthralgias or myalgias.  He denies exposure to COVID-19.  He did have a COVID-19 test done yesterday, but does not know the results.  He has had urinary urgency and frequency.  EMS noted fever and gave acetaminophen.  Past Medical History:  Diagnosis Date  . Allergy   . DVT (deep venous thrombosis) (HCC)   . Peripheral artery disease (HCC)   . Status post ablation of incompetent vein using laser    Left Leg  . Varicose veins of both lower extremities     Patient Active Problem List   Diagnosis Date Noted  . Swelling of limb 09/11/2016  . Lower limb ulcer, ankle, right, with fat layer exposed (HCC) 09/11/2016  . Varicose veins of lower extremities with ulcer (HCC) 09/11/2016    Past Surgical History:  Procedure Laterality Date  . CHOLECYSTECTOMY    . FOOT SURGERY Right   . HAND AMPUTATION Left   . IVC FILTER PLACEMENT ([facility] HX)    . SPLENECTOMY, TOTAL         No family history on file.  Social History   Tobacco Use  . Smoking status: Former Games developermoker  . Smokeless tobacco: Never Used  Substance Use Topics  . Alcohol use: No  . Drug use: No    Home Medications Prior to Admission medications   Medication Sig Start Date End Date Taking? Authorizing Provider  ALPRAZolam Prudy Feeler(XANAX) 0.5 MG tablet  09/24/16   [provider]  apixaban (ELIQUIS) 5 MG TABS tablet Take by mouth.    [provider]  cephALEXin (KEFLEX) 500 MG capsule Take 1 capsule (500 mg total) by mouth 2 (two) times daily. 07/19/18   Bethann BerkshireZammit, Joseph, MD  cetirizine (ZYRTEC) 10 MG tablet Take by mouth.    [provider]  Cyanocobalamin (B-12) 1000 MCG CAPS Take by mouth. 02/06/10   [provider]  folic acid (FOLVITE) 1 MG tablet Take by mouth. 02/06/10   [provider]  Macitentan 10 MG TABS Take by mouth.    [provider]  Multiple Vitamin (MULTI-VITAMINS) TABS Take by mouth.    [provider]  Omega-3 Fatty Acids (FISH OIL PO) Take by mouth.    [provider]  rivaroxaban (XARELTO) 10 MG TABS tablet Take 10 mg by mouth daily.    [provider]  tadalafil, PAH, (ADCIRCA) 20 MG tablet Take by mouth.    [provider]    Allergies    Patient has no known allergies.  Review of Systems   Review of Systems  Respiratory: Positive for shortness of breath.   All other systems reviewed and are negative.   Physical Exam Updated Vital Signs BP (!) 100/59 (BP Location: Left Arm)   Pulse (!) 108   Temp (!)  101 F (38.3 C) (Oral)   Resp (!) 28   Ht 6\' 4"  (1.93 m)   Wt 100.7 kg   SpO2 90%   BMI 27.02 kg/m   Physical Exam Vitals and nursing note reviewed.   73 year old male, resting comfortably and in no acute distress. Vital signs are significant for elevated respiratory rate, heart rate, temperature. Oxygen saturation is 90%, which is hypoxic Head is normocephalic and atraumatic. PERRLA, EOMI. Oropharynx is clear. Neck is nontender and supple without adenopathy or JVD. Back is nontender and there is no CVA tenderness. Lungs have few rales at each base.  There are no wheezes or rhonchi. Chest is nontender. Heart has regular rate and rhythm without murmur. Abdomen is soft, flat, nontender without masses or hepatosplenomegaly and peristalsis is  normoactive. Extremities: Status post left distal forearm amputation.  No cyanosis or edema. Skin is warm and dry without rash. Neurologic: Mental status is normal, cranial nerves are intact, there are no motor or sensory deficits.  ED Results / Procedures / Treatments   Labs (all labs ordered are listed, but only abnormal results are displayed) Labs Reviewed  COMPREHENSIVE METABOLIC PANEL - Abnormal; Notable for the following components:      Result Value   Potassium 3.3 (*)    CO2 21 (*)    Glucose, Bld 118 (*)    Calcium 8.7 (*)    Total Protein 8.4 (*)    Albumin 3.4 (*)    AST 47 (*)    Total Bilirubin 2.1 (*)    All other components within normal limits  CBC WITH DIFFERENTIAL/PLATELET - Abnormal; Notable for the following components:   WBC 16.8 (*)    RBC 3.08 (*)    Hemoglobin 10.4 (*)    HCT 31.3 (*)    MCV 101.6 (*)    Platelets 461 (*)    nRBC 0.3 (*)    Neutro Abs 13.5 (*)    Monocytes Absolute 1.6 (*)    Abs Immature Granulocytes 0.23 (*)    All other components within normal limits  URINALYSIS, ROUTINE W REFLEX MICROSCOPIC - Abnormal; Notable for the following components:   Protein, ur 100 (*)    All other components within normal limits  CBG MONITORING, ED - Abnormal; Notable for the following components:   Glucose-Capillary 118 (*)    All other components within normal limits  CULTURE, BLOOD (ROUTINE X 2)  CULTURE, BLOOD (ROUTINE X 2)  URINE CULTURE  SARS CORONAVIRUS 2 (TAT 6-24 HRS)  LACTIC ACID, PLASMA  LACTIC ACID, PLASMA  APTT  PROTIME-INR  POC SARS CORONAVIRUS 2 AG -  ED    EKG EKG Interpretation  Date/Time:  Wednesday August 26 2019 04:14:56 EST Ventricular Rate:  108 PR Interval:    QRS Duration: 97 QT Interval:  347 QTC Calculation: 466 R Axis:   -10 Text Interpretation: Sinus tachycardia Consider left atrial enlargement RSR' in V1 or V2, right VCD or RVH When compared with ECG of 10/21/2014, Premature ventricular complexes are no  longer present Confirmed by 10/23/2014 (Dione Booze) on 08/26/2019 4:24:07 AM   Radiology DG Chest Port 1 View  Result Date: 08/26/2019 CLINICAL DATA:  Fever and shortness of breath EXAM: PORTABLE CHEST 1 VIEW COMPARISON:  10/21/2014 FINDINGS: Patchy bilateral pulmonary infiltrate greater on the right. Generous heart size that is stable. No effusion or pneumothorax IMPRESSION: Patchy bilateral pneumonia. Electronically Signed   By: 10/23/2014 M.D.   On: 08/26/2019 04:33    Procedures  Procedures  CRITICAL CARE Performed by: Delora Fuel Total critical care time: 50 minutes Critical care time was exclusive of separately billable procedures and treating other patients. Critical care was necessary to treat or prevent imminent or life-threatening deterioration. Critical care was time spent personally by me on the following activities: development of treatment plan with patient and/or surrogate as well as nursing, discussions with consultants, evaluation of patient's response to treatment, examination of patient, obtaining history from patient or surrogate, ordering and performing treatments and interventions, ordering and review of laboratory studies, ordering and review of radiographic studies, pulse oximetry and re-evaluation of patient's condition.  Medications Ordered in ED Medications - No data to display  ED Course  I have reviewed the triage vital signs and the nursing notes.  Pertinent labs & imaging results that were available during my care of the patient were reviewed by me and considered in my medical decision making (see chart for details).  MDM Rules/Calculators/A&P Fever with respiratory and urinary systems likely sources.  Sepsis labs are obtained and will check chest x-ray and urinalysis.  Old records are reviewed, and he has no relevant past visits.   Chest x-ray shows bilateral pneumonia. Urinalysis shows no evidence of infection. WBC is normal but with slight left shift.  Macrocytic anemia is present with hemoglobin slightly lower than last recorded value in 2015. Lactic acid is normal. Potassium is slightly low and he is given a dose of oral potassium. Mild elevation of AST is not felt to be clinically significant. Patient is remained hemodynamically stable in the ED. Case is discussed with Dr. Olevia Bowens of [facility], who agrees agrees to admit the patient.  [name] was evaluated in Emergency Department on 08/26/2019 for the symptoms described in the history of present illness. He was evaluated in the context of the global COVID-19 pandemic, which necessitated consideration that the patient might be at risk for infection with the SARS-CoV-2 virus that causes COVID-19. Institutional protocols and algorithms that pertain to the evaluation of patients at risk for COVID-19 are in a state of rapid change based on information released by regulatory bodies including the CDC and federal and state organizations. These policies and algorithms were followed during the patient's care in the ED.  Final Clinical Impression(s) / ED Diagnoses Final diagnoses:  Community acquired pneumonia of left lung, unspecified part of lung  Hypokalemia  Macrocytic anemia  Elevated AST (SGOT)    Rx / DC Orders ED Discharge Orders    None       Delora Fuel, MD 44/92/01 [phone]

## 2019-08-26 NOTE — ED Notes (Signed)
Have contacted pharmacy

## 2019-08-26 NOTE — ED Notes (Signed)
Pt reports he cannot have any swabs placed in his Right Nare for viral testing. Pt reports he would incur a really bad nose bleed that's hard to stop.

## 2019-08-26 NOTE — ED Notes (Signed)
Patient pulled IV out.  Nurse notified.

## 2019-08-26 NOTE — ED Notes (Signed)
CRITICAL VALUE ALERT  Critical Value:  COVID +  Date & Time Notied:  *08/26/2019 1055  Provider Notified: Dr. Joesph Fillers  Orders Received/Actions taken: None yet

## 2019-08-26 NOTE — ED Triage Notes (Signed)
Pt arrived via [facility] from home c/o SOB X 2-3 Days. Pt presents with unproductive cough, febrile, urinary frequency and tachypnea. EMS report administering 1000mg  Tylenol prior to arrival. Pt reports being seen in [facility] but says they told him they couldn't find anything wrong with him. Pt is A+o X 4 and ambulatory.

## 2019-08-26 NOTE — H&P (Addendum)
Patient Demographics:    [name], is a 73 y.o. 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   H/o Pulmonary HTN (HCC)    --Patient is found to be hypoxic in the ED requiring up to 4 L of oxygen -Had fevers up to 101 and was tachycardic up to HR  108 also tachypneic of 2 respiratory understanding  1)Acute hypoxic respiratory failure secondary to COVID-19 infection/pneumonia--- The treatment plan and use of medications  for treatment of COVID-19 infection and possible side effects were discussed with patient/wife,  explained that there is No proven definitive treatment for COVID-19 infection, any medications used here are based on published clinical articles/anecdotal data which at times and not yet peer-reviewed or randomized control trials. Complete risks and long-term side effects are unknown, however in the best clinical judgment they seem to be of some clinical benefit . --potential side effects of Remdesivir including, but not limited to allergic reaction, nausea, vomiting, elevated LFTs discussed with patient Denton Lank/wife ,also discussed potential steroid side effect including elevated blood sugars, elevated blood pressure, psychosis/anxiety,  insomnia --Patient/wife verbalizes understanding and agrees to treatment protocols   --Patient is positive for COVID-19 infection, chest x-ray with findings of infiltrates/opacities,  patient is  hypoxic and requiring continuous supplemental oxygen---patient meets criteria for initiation of Remdesivir AND Decadron/Steroid therapy per protocol  --Elevated inflammatory markers noted including fibrinogen of 748, LDH of 336, D-dimer of 1.93, CRP of 15.4, ferritin of 1204 --Check and trend fibrinogen, CRP, pro calcitonin, CBC, BMP, d-dimer, LDH, ferritin and LFTs --Supplemental oxygen to keep O2 sats above 93% -Follow serial chest x-rays and ABGs as indicated --- Encourage prone positioning for More than 16 hours/day in increments of 2 to 3 hours at a time if able to tolerate --Attempt to maintain euvolemic state --Zinc and vitamin C as ordered -Albuterol inhaler as needed   2)Leukocytosis and pneumonia--- -WBC is 16.8 , patient received Rocephin and azithromycin in the ED and blood cultures were sent,  -Procalcitonin pending, anticipate that leukocytosis will be perpetuated by steroids -Consider stopping Rocephin/azithromycin if procalcitonin negative  3)H/o DVT--- stable, continue Eliquis  4)Social/Ethics--- plan of care discussed with patient and wife, CODE STATUS reviewed, patient is a full code  With History of - Reviewed by me  Past Medical History:  Diagnosis Date  . Allergy   . DVT (deep venous thrombosis) (HCC)   . Peripheral artery disease (HCC)   . Status post ablation of incompetent vein using laser    Left Leg  .  Varicose veins of both lower extremities       Past Surgical History:  Procedure Laterality Date  . CHOLECYSTECTOMY    . FOOT SURGERY Right   . HAND AMPUTATION Left   . IVC FILTER PLACEMENT ([facility] HX)    . SPLENECTOMY, TOTAL        Chief Complaint  Patient presents with  . Shortness of Breath      HPI:    [name]  is a 73 y.o. male with past medical history relevant for PAD, H/o Pulm HTN, h/o DVT on apixaban resents to the ED with subjective fevers and mostly dry cough -Additional history obtained from patient's wife by phone -No  vomiting, had 2 episodes of diarrhea in the last 24 hours -No chest pains or palpitations or dizziness  In ED CXR patchy bilateral pneumonia -Covid test is positive -Elevated inflammatory markers noted including fibrinogen of 748, LDH of 336, D-dimer of 1.93, CRP of 15.4, ferritin of 1204   -Lactic acid is 1.2, INR is 1.1, creatinine 1.0, AST 47 ALT 26 -WBC is 16.8 (patient received Rocephin and azithromycin in the ED and blood cultures were sent)  --Patient is found to be hypoxic in the ED requiring up to 4 L of oxygen -Had fevers up to 101 and was tachycardic up to HR  108 also tachypneic of 2 respiratory understanding    Review of systems:    In addition to the HPI above,   A full Review of  Systems was done, all other systems reviewed are negative except as noted above in HPI , .    Social History:  Reviewed by me    Social History   Tobacco Use  . Smoking status: Former Games developer  . Smokeless tobacco: Never Used  Substance Use Topics  . Alcohol use: No      Family History :  Reviewed by me  HTN   Home Medications:   Prior to Admission medications   Medication Sig Start Date End Date Taking? Authorizing Provider  ALPRAZolam Prudy Feeler) 0.5 MG tablet  09/24/16   [provider]  apixaban (ELIQUIS) 5 MG TABS tablet Take by mouth.    [provider]  cephALEXin (KEFLEX) 500 MG capsule Take 1 capsule (500 mg total) by mouth 2 (two) times daily. 07/19/18   Bethann Berkshire, MD  cetirizine (ZYRTEC) 10 MG tablet Take by mouth.    [provider]  Cyanocobalamin (B-12) 1000 MCG CAPS Take by mouth. 02/06/10   [provider]  folic acid (FOLVITE) 1 MG tablet Take by mouth. 02/06/10   [provider]  Macitentan 10 MG TABS Take by mouth.    [provider]  Multiple Vitamin (MULTI-VITAMINS) TABS Take by mouth.    [provider]  Omega-3 Fatty Acids (FISH OIL PO) Take by mouth.    [provider]  rivaroxaban  (XARELTO) 10 MG TABS tablet Take 10 mg by mouth daily.    [provider]  tadalafil, PAH, (ADCIRCA) 20 MG tablet Take by mouth.    [provider]     Allergies:    No Known Allergies   Physical Exam:   Vitals  Blood pressure (!) 121/55, pulse (!) 106, temperature 98.1 F (36.7 C), temperature source Oral, resp. rate (!) 28, height  (1.93 m), weight 100.7 kg, SpO2 96 %.  Temp:  [98.1 F (36.7 C)-101 F (38.3 C)] 98.1 F (36.7 C) (12/30 1208) Pulse Rate:  [91-108] 103 (12/30 1400) Resp:  [  18-30] 26 (12/30 1400) BP: (97-129)/(38-64) 129/63 (12/30 1300) SpO2:  [90 %-100 %] 97 % (12/30 1400) FiO2 (%):  [2 %] 2 % (12/30 0630) Weight:  [100.7 kg] 100.7 kg (12/30 0403)   Physical Examination: General appearance - alert,  and in no distress  Mental status - alert, oriented to person, place, and time,  Eyes - sclera anicteric Nose- [place] 4 L/min Neck - supple, no JVD elevation , Chest -diminished in bases with scattered rhonchi bilaterally, tachypneic  heart - S1 and S2 normal, regular , tachycardic Abdomen - soft, nontender, nondistended, no masses or organomegaly Neurological - screening mental status exam normal, neck supple without rigidity, cranial nerves II through XII intact, DTR's normal and symmetric Extremities - no pedal edema noted, intact peripheral pulses  Skin - warm, dry     Data Review:    CBC Recent Labs  Lab 08/26/19 0446  WBC 16.8*  HGB 10.4*  HCT 31.3*  PLT 461*  MCV 101.6*  MCH 33.8  MCHC 33.2  RDW 14.0  LYMPHSABS 1.4  MONOABS 1.6*  EOSABS 0.0  BASOSABS 0.0   ------------------------------------------------------------------------------------------------------------------  Chemistries  Recent Labs  Lab 08/26/19 0446  NA 135  K 3.3*  CL 101  CO2 21*  GLUCOSE 118*  BUN 17  CREATININE 1.07  CALCIUM 8.7*  AST 47*  ALT 26  ALKPHOS 63  BILITOT 2.1*    ------------------------------------------------------------------------------------------------------------------ estimated creatinine clearance is 77.7 mL/min (by C-G formula based on SCr of 1.07 mg/dL). ------------------------------------------------------------------------------------------------------------------ No results for input(s): TSH, T4TOTAL, T3FREE, THYROIDAB in the last 72 hours.  Invalid input(s): FREET3   Coagulation profile Recent Labs  Lab 08/26/19 0446  INR 1.1   ------------------------------------------------------------------------------------------------------------------- No results for input(s): DDIMER in the last 72 hours. -------------------------------------------------------------------------------------------------------------------  Cardiac Enzymes No results for input(s): CKMB, TROPONINI, MYOGLOBIN in the last 168 hours.  Invalid input(s): CK ------------------------------------------------------------------------------------------------------------------    Component Value Date/Time   BNP 16,185 (H) 04/13/2014 1019     ---------------------------------------------------------------------------------------------------------------  Urinalysis    Component Value Date/Time   COLORURINE YELLOW 08/26/2019 0404   APPEARANCEUR CLEAR 08/26/2019 0404   APPEARANCEUR Hazy 04/13/2014 1629   LABSPEC 1.013 08/26/2019 0404   LABSPEC 1.013 04/13/2014 1629   PHURINE 6.0 08/26/2019 0404   GLUCOSEU NEGATIVE 08/26/2019 0404   GLUCOSEU Negative 04/13/2014 1629   HGBUR NEGATIVE 08/26/2019 0404   BILIRUBINUR NEGATIVE 08/26/2019 0404   BILIRUBINUR Negative 04/13/2014 1629   KETONESUR NEGATIVE 08/26/2019 0404   PROTEINUR 100 (A) 08/26/2019 0404   UROBILINOGEN 1.0 04/15/2009 0853   NITRITE NEGATIVE 08/26/2019 0404   LEUKOCYTESUR NEGATIVE 08/26/2019 0404   LEUKOCYTESUR Negative 04/13/2014 1629    ----------------------------------------------------------------------------------------------------------------   Imaging Results:    DG Chest Port 1 View  Result Date: 08/26/2019 CLINICAL DATA:  Fever and shortness of breath EXAM: PORTABLE CHEST 1 VIEW COMPARISON:  10/21/2014 FINDINGS: Patchy bilateral pulmonary infiltrate greater on the right. Generous heart size that is stable. No effusion or pneumothorax IMPRESSION: Patchy bilateral pneumonia. Electronically Signed   By: Marnee Spring M.D.   On: 08/26/2019 04:33    Radiological Exams on Admission: DG Chest Port 1 View  Result Date: 08/26/2019 CLINICAL DATA:  Fever and shortness of breath EXAM: PORTABLE CHEST 1 VIEW COMPARISON:  10/21/2014 FINDINGS: Patchy bilateral pulmonary infiltrate greater on the right. Generous heart size that is stable. No effusion or pneumothorax IMPRESSION: Patchy bilateral pneumonia. Electronically Signed   By: Marnee Spring M.D.   On: 08/26/2019 04:33   DVT Prophylaxis -SCD /Eliquis AM  Labs Ordered, also please review Full Orders  Family Communication: Admission, patients condition and plan of care including tests being ordered have been discussed with the patient and wife who indicate understanding and agree with the plan   Code Status - Full Code  Likely DC to  home  Condition   Stable  Roxan Hockey M.D on 08/26/2019 at 12:44 PM Go to [email or web address] -  for contact info  [facility] - Office  [phone]

## 2019-08-26 NOTE — ED Notes (Signed)
Mom calling to discuss isolation guidelines for COVID-19.  Discussed recommendations per CDC.  Mom was concerned daycare was asking for testing after 10 day isolation period.  RN explained, it was not necessary, but daycare could have their own guidelines.  Mom will review CDC guidelines on website and talk with daycare.

## 2019-08-27 DIAGNOSIS — J069 Acute upper respiratory infection, unspecified: Secondary | ICD-10-CM

## 2019-08-27 DIAGNOSIS — I272 Pulmonary hypertension, unspecified: Secondary | ICD-10-CM

## 2019-08-27 DIAGNOSIS — E876 Hypokalemia: Secondary | ICD-10-CM

## 2019-08-27 DIAGNOSIS — D539 Nutritional anemia, unspecified: Secondary | ICD-10-CM

## 2019-08-27 LAB — GLUCOSE, CAPILLARY: Glucose-Capillary: 212 mg/dL — ABNORMAL HIGH (ref 70–99)

## 2019-08-27 LAB — CBC WITH DIFFERENTIAL/PLATELET
Abs Immature Granulocytes: 0.08 10*3/uL — ABNORMAL HIGH (ref 0.00–0.07)
Basophils Absolute: 0 10*3/uL (ref 0.0–0.1)
Basophils Relative: 0 %
Eosinophils Absolute: 0 10*3/uL (ref 0.0–0.5)
Eosinophils Relative: 0 %
HCT: 30.6 % — ABNORMAL LOW (ref 39.0–52.0)
Hemoglobin: 9.7 g/dL — ABNORMAL LOW (ref 13.0–17.0)
Immature Granulocytes: 1 %
Lymphocytes Relative: 13 %
Lymphs Abs: 0.9 10*3/uL (ref 0.7–4.0)
MCH: 32.8 pg (ref 26.0–34.0)
MCHC: 31.7 g/dL (ref 30.0–36.0)
MCV: 103.4 fL — ABNORMAL HIGH (ref 80.0–100.0)
Monocytes Absolute: 0.4 10*3/uL (ref 0.1–1.0)
Monocytes Relative: 5 %
Neutro Abs: 5.8 10*3/uL (ref 1.7–7.7)
Neutrophils Relative %: 81 %
Platelets: 499 10*3/uL — ABNORMAL HIGH (ref 150–400)
RBC: 2.96 MIL/uL — ABNORMAL LOW (ref 4.22–5.81)
RDW: 14.3 % (ref 11.5–15.5)
WBC: 7.1 10*3/uL (ref 4.0–10.5)
nRBC: 0.6 % — ABNORMAL HIGH (ref 0.0–0.2)

## 2019-08-27 LAB — COMPREHENSIVE METABOLIC PANEL
ALT: 26 U/L (ref 0–44)
AST: 40 U/L (ref 15–41)
Albumin: 2.9 g/dL — ABNORMAL LOW (ref 3.5–5.0)
Alkaline Phosphatase: 59 U/L (ref 38–126)
Anion gap: 12 (ref 5–15)
BUN: 20 mg/dL (ref 8–23)
CO2: 20 mmol/L — ABNORMAL LOW (ref 22–32)
Calcium: 8.5 mg/dL — ABNORMAL LOW (ref 8.9–10.3)
Chloride: 105 mmol/L (ref 98–111)
Creatinine, Ser: 0.75 mg/dL (ref 0.61–1.24)
GFR calc Af Amer: >60 mL/min (ref >60)
GFR calc non Af Amer: >60 mL/min (ref >60)
Glucose, Bld: 170 mg/dL — ABNORMAL HIGH (ref 70–99)
Potassium: 4.1 mmol/L (ref 3.5–5.1)
Sodium: 137 mmol/L (ref 135–145)
Total Bilirubin: 0.9 mg/dL (ref 0.3–1.2)
Total Protein: 7.8 g/dL (ref 6.5–8.1)

## 2019-08-27 LAB — FERRITIN: Ferritin: 998 ng/mL — ABNORMAL HIGH (ref 24–336)

## 2019-08-27 LAB — URINE CULTURE: Culture: <10000 — AB

## 2019-08-27 LAB — C-REACTIVE PROTEIN: CRP: 12.5 mg/dL — ABNORMAL HIGH (ref <1.0)

## 2019-08-27 LAB — CBG MONITORING, ED
Glucose-Capillary: 159 mg/dL — ABNORMAL HIGH (ref 70–99)
Glucose-Capillary: 162 mg/dL — ABNORMAL HIGH (ref 70–99)

## 2019-08-27 LAB — PROCALCITONIN: Procalcitonin: 0.56 ng/mL

## 2019-08-27 LAB — MAGNESIUM: Magnesium: 2.3 mg/dL (ref 1.7–2.4)

## 2019-08-27 LAB — PHOSPHORUS: Phosphorus: 2.9 mg/dL (ref 2.5–4.6)

## 2019-08-27 LAB — D-DIMER, QUANTITATIVE: D-Dimer, Quant: 1.52 ug/mL-FEU — ABNORMAL HIGH (ref 0.00–0.50)

## 2019-08-27 NOTE — Progress Notes (Signed)
PROGRESS NOTE    [name]  OZH:[id number] DOB: May 29, 1946 DOA: 08/26/2019 PCP: Leanna SatoMiles, Linda M, MD    Brief Narrative:  HPI per Dr. Mariea ClontsEmokpae:  [name]  is a 73 y.o. male with past medical history relevant for PAD, H/o Pulm HTN, h/o DVT on apixaban resents to the ED with subjective fevers and mostly dry cough -Additional history obtained from patient's wife by phone -No vomiting, had 2 episodes of diarrhea in the last 24 hours -No chest pains or palpitations or dizziness  In ED CXR patchy bilateral pneumonia -Covid test is positive -Elevated inflammatory markers noted including fibrinogen of 748, LDH of 336, D-dimer of 1.93, CRP of 15.4, ferritin of 1204   -Lactic acid is 1.2, INR is 1.1, creatinine 1.0, AST 47 ALT 26 -WBC is 16.8 (patient received Rocephin and azithromycin in the ED and blood cultures were sent)  --Patient is found to be hypoxic in the ED requiring up to 4 L of oxygen -Had fevers up to 101 and was tachycardic up to HR  108 also tachypneic of 2 respiratory understanding   Assessment & Plan:   Principal Problem:   Pneumonia due to COVID-19 virus Active Problems:   Varicose veins of lower extremities with ulcer (HCC)   Acute respiratory disease due to COVID-19 virus   Acute Respiratory failure with hypoxia (HCC)   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 Acute respiratory failure with hypoxia.  Secondary to COVID-19 pneumonia.  He is still requiring supplemental oxygen and has increased respiratory rate.  Continue to monitor and wean down oxygen as tolerated. 2. COVID-19 pneumonia.  Started on remdesivir and intravenous steroids per protocol.  Continue supportive treatment. 3. Leukocytosis and pneumonia.  WBC elevated on admission.  Started on intravenous antibiotics.  Procalcitonin is mildly elevated at 0.56.  Continue IV antibiotics for now. 4. History of DVT.  Continue on Eliquis.   DVT prophylaxis:  Apixaban Code Status: Full code Family Communication: Discussed with patient Disposition Plan: Discharge once respiratory status has improved and remdesivir course has been completed   Consultants:     Procedures:     Antimicrobials:   Ceftriaxone 12/30 >  Azithromycin 12/30 >   Subjective: Patient says he is feeling better.  No shortness of breath or cough.  He does have increased respiratory rate.  Objective: Vitals:   08/27/19 1300 08/27/19 1345 08/27/19 1600 08/27/19 1904  BP:   126/64 (!) 110/96  Pulse: 89 98 90 95  Resp: 18 18 (!) 22 17  Temp:      TempSrc:      SpO2: 96% 94% 98% 93%  Weight:      Height:        Intake/Output Summary (Last 24 hours) at 08/27/2019 2022 Last data filed at 08/26/2019 2211 Gross per 24 hour  Intake 3 ml  Output -  Net 3 ml   Filed Weights   08/26/19 0403  Weight: 100.7 kg    Examination:  General exam: Appears calm and comfortable  Respiratory system: Clear to auscultation.  Increased respiratory effort Cardiovascular system: S1 & S2 heard, RRR. No JVD, murmurs, rubs, gallops or clicks. No pedal edema. Gastrointestinal system: Abdomen is nondistended, soft and nontender. No organomegaly or masses felt. Normal bowel sounds heard. Central nervous system: Alert and oriented. No focal neurological deficits. Extremities: Symmetric 5 x 5 power. Skin: No rashes, lesions or ulcers Psychiatry: Judgement and insight appear normal. Mood &  affect appropriate.     Data Reviewed: I have personally reviewed following labs and imaging studies  CBC: Recent Labs  Lab 08/26/19 0446 08/27/19 0405  WBC 16.8* 7.1  NEUTROABS 13.5* 5.8  HGB 10.4* 9.7*  HCT 31.3* 30.6*  MCV 101.6* 103.4*  PLT 461* 151*   Basic Metabolic Panel: Recent Labs  Lab 08/26/19 0446 08/27/19 0405  NA 135 137  K 3.3* 4.1  CL 101 105  CO2 21* 20*  GLUCOSE 118* 170*  BUN 17 20  CREATININE 1.07 0.75  CALCIUM 8.7* 8.5*  MG  --  2.3  PHOS  --  2.9    GFR: Estimated Creatinine Clearance: 104 mL/min (by C-G formula based on SCr of 0.75 mg/dL). Liver Function Tests: Recent Labs  Lab 08/26/19 0446 08/27/19 0405  AST 47* 40  ALT 26 26  ALKPHOS 63 59  BILITOT 2.1* 0.9  PROT 8.4* 7.8  ALBUMIN 3.4* 2.9*   No results for input(s): LIPASE, AMYLASE in the last 168 hours. No results for input(s): AMMONIA in the last 168 hours. Coagulation Profile: Recent Labs  Lab 08/26/19 0446  INR 1.1   Cardiac Enzymes: No results for input(s): CKTOTAL, CKMB, CKMBINDEX, TROPONINI in the last 168 hours. BNP (last 3 results) No results for input(s): PROBNP in the last 8760 hours. HbA1C: No results for input(s): HGBA1C in the last 72 hours. CBG: Recent Labs  Lab 08/26/19 0355 08/26/19 1747 08/26/19 2206 08/27/19 0750 08/27/19 1254  GLUCAP 118* 134* 182* 159* 162*   Lipid Profile: No results for input(s): CHOL, HDL, LDLCALC, TRIG, CHOLHDL, LDLDIRECT in the last 72 hours. Thyroid Function Tests: No results for input(s): TSH, T4TOTAL, FREET4, T3FREE, THYROIDAB in the last 72 hours. Anemia Panel: Recent Labs    08/26/19 1332 08/27/19 0405  FERRITIN 1,204* 998*   Sepsis Labs: Recent Labs  Lab 08/26/19 0446 08/26/19 0659 08/26/19 1332 08/27/19 0405  PROCALCITON  --   --  0.82 0.56  LATICACIDVEN 1.1 1.2  --   --     Recent Results (from the past 240 hour(s))  Urine culture     Status: Abnormal   Collection Time: 08/26/19  4:04 AM   Specimen: In/Out Cath Urine  Result Value Ref Range Status   Specimen Description   Final    IN/OUT CATH URINE Performed at [facility], [address] [place] [address], [place], [place] [address]    Special Requests   Final    NONE Performed at [facility], [address] [place], [place] [address]    Culture (A)  Final    <10,000 COLONIES/mL INSIGNIFICANT GROWTH Performed at [facility], [place] [address], [place], [place] [address]    Report Status 08/27/2019 FINAL  Final  Blood Culture (routine x  2)     Status: None (Preliminary result)   Collection Time: 08/26/19  4:50 AM   Specimen: BLOOD RIGHT ARM  Result Value Ref Range Status   Specimen Description BLOOD RIGHT ARM  Final   Special Requests   Final    BOTTLES DRAWN AEROBIC AND ANAEROBIC Blood Culture adequate volume   Culture   Final    NO GROWTH 1 DAY Performed at [facility], [address] [place], [place] [address]    Report Status PENDING  Incomplete  Blood Culture (routine x 2)     Status: None (Preliminary result)   Collection Time: 08/26/19  4:55 AM   Specimen: BLOOD RIGHT HAND  Result Value Ref Range Status   Specimen  Description BLOOD RIGHT HAND  Final   Special Requests   Final    BOTTLES DRAWN AEROBIC AND ANAEROBIC Blood Culture adequate volume   Culture   Final    NO GROWTH 1 DAY Performed at [facility], [address] [place], [place] [address]    Report Status PENDING  Incomplete  SARS CORONAVIRUS 2 (TAT 6-24 HRS) Nasopharyngeal Nasopharyngeal Swab     Status: Abnormal   Collection Time: 08/26/19  5:03 AM   Specimen: Nasopharyngeal Swab  Result Value Ref Range Status   SARS Coronavirus 2 POSITIVE (A) NEGATIVE Final    Comment: emailed L. Berdik RN 14:15 08/26/19 ([email or web address]) (NOTE) SARS-CoV-2 target nucleic acids are DETECTED. The SARS-CoV-2 RNA is generally detectable in upper and lower respiratory specimens during the acute phase of infection. Positive results are indicative of the presence of SARS-CoV-2 RNA. Clinical correlation with patient history and other diagnostic information is  necessary to determine patient infection status. Positive results do not rule out bacterial infection or co-infection with other viruses.  The expected result is Negative. Fact Sheet for Patients: HairSlick.no Fact Sheet for Healthcare Providers: quierodirigir.com This test is not yet approved or cleared by the [place] FDA and  has been authorized for  detection and/or diagnosis of SARS-CoV-2 by FDA under an Emergency Use Authorization (EUA). This EUA will remain  in effect (meaning this test can be used) for the duration of the COVID-19 declaration un der Section 564(b)(1) of the Act, 21 U.S.C. section 360bbb-3(b)(1), unless the authorization is terminated or revoked sooner. Performed at [facility], [address], [place], [place] [address]   Respiratory Panel by RT PCR (Flu A&B, Covid) - Nasopharyngeal Swab     Status: Abnormal   Collection Time: 08/26/19  9:15 AM   Specimen: Nasopharyngeal Swab  Result Value Ref Range Status   SARS Coronavirus 2 by RT PCR POSITIVE (A) NEGATIVE Final    Comment: RESULT CALLED TO, READ BACK BY AND VERIFIED WITH: FARMER,E. AT 1050 ON 08/26/2019 BY BAUGHAM,M. (NOTE) SARS-CoV-2 target nucleic acids are DETECTED. SARS-CoV-2 RNA is generally detectable in upper respiratory specimens  during the acute phase of infection. Positive results are indicative of the presence of the identified virus, but do not rule out bacterial infection or co-infection with other pathogens not detected by the test. Clinical correlation with patient history and other diagnostic information is necessary to determine patient infection status. The expected result is Negative. Fact Sheet for Patients:  https://www.moore.com/ Fact Sheet for Healthcare Providers: https://www.young.biz/ This test is not yet approved or cleared by the [place] FDA and  has been authorized for detection and/or diagnosis of SARS-CoV-2 by FDA under an Emergency Use Authorization (EUA).  This EUA will remain in effect (meaning this test can  be used) for the duration of  the COVID-19 declaration under Section 564(b)(1) of the Act, 21 U.S.C. section 360bbb-3(b)(1), unless the authorization is terminated or revoked sooner.    Influenza A by PCR NEGATIVE NEGATIVE Final   Influenza B by PCR NEGATIVE  NEGATIVE Final    Comment: (NOTE) The Xpert Xpress SARS-CoV-2/FLU/RSV assay is intended as an aid in  the diagnosis of influenza from Nasopharyngeal swab specimens and  should not be used as a sole basis for treatment. Nasal washings and  aspirates are unacceptable for Xpert Xpress SARS-CoV-2/FLU/RSV  testing. Fact Sheet for Patients: https://www.moore.com/ Fact Sheet for Healthcare Providers: https://www.young.biz/ This test is not yet approved or cleared by the [place] and  has been authorized for detection and/or diagnosis of SARS-CoV-2 by  FDA under an Emergency Use Authorization (EUA). This EUA will remain  in effect (meaning this test can be used) for the duration of the  Covid-19 declaration under Section 564(b)(1) of the Act, 21  U.S.C. section 360bbb-3(b)(1), unless the authorization is  terminated or revoked. Performed at [facility], [address] [place], [place] [address]          Radiology Studies: [facility] Chest [facility] 1 View  Result Date: 08/26/2019 CLINICAL DATA:  Fever and shortness of breath EXAM: PORTABLE CHEST 1 VIEW COMPARISON:  10/21/2014 FINDINGS: Patchy bilateral pulmonary infiltrate greater on the right. Generous heart size that is stable. No effusion or pneumothorax IMPRESSION: Patchy bilateral pneumonia. Electronically Signed   By: Marnee Spring M.D.   On: 08/26/2019 04:33        Scheduled Meds: . albuterol  2 puff Inhalation Q6H  . apixaban  5 mg Oral BID  . vitamin C  500 mg Oral Daily  . folic acid  1 mg Oral Daily  . guaiFENesin  600 mg Oral BID  . insulin aspart  0-5 Units Subcutaneous QHS  . insulin aspart  0-6 Units Subcutaneous TID WC  . loratadine  10 mg Oral Daily  . methylPREDNISolone (SOLU-MEDROL) injection  60 mg Intravenous Q12H  . multivitamin with minerals  1 tablet Oral Daily  . sodium chloride flush  3 mL Intravenous Q12H  . thiamine  100 mg Oral Daily  . zinc sulfate  220 mg  Oral Daily   Continuous Infusions: . sodium chloride    . azithromycin 500 mg (08/27/19 0457)  . cefTRIAXone (ROCEPHIN)  IV Stopped (08/27/19 0453)  . remdesivir 100 mg in NS 100 mL 100 mg (08/27/19 1257)     LOS: 1 day    Time spent:    Erick Blinks, MD [facility]   If 7PM-7AM, please contact night-coverage [email or web address]  08/27/2019, 8:22 PM

## 2019-08-28 ENCOUNTER — Inpatient Hospital Stay (HOSPITAL_COMMUNITY): Payer: Medicare HMO

## 2019-08-28 DIAGNOSIS — E876 Hypokalemia: Secondary | ICD-10-CM

## 2019-08-28 DIAGNOSIS — J9601 Acute respiratory failure with hypoxia: Secondary | ICD-10-CM

## 2019-08-28 DIAGNOSIS — J1282 Pneumonia due to coronavirus disease 2019: Secondary | ICD-10-CM

## 2019-08-28 LAB — COMPREHENSIVE METABOLIC PANEL
ALT: 29 U/L (ref 0–44)
AST: 40 U/L (ref 15–41)
Albumin: 3.4 g/dL — ABNORMAL LOW (ref 3.5–5.0)
Alkaline Phosphatase: 67 U/L (ref 38–126)
Anion gap: 12 (ref 5–15)
BUN: 22 mg/dL (ref 8–23)
CO2: 20 mmol/L — ABNORMAL LOW (ref 22–32)
Calcium: 8.6 mg/dL — ABNORMAL LOW (ref 8.9–10.3)
Chloride: 105 mmol/L (ref 98–111)
Creatinine, Ser: 0.86 mg/dL (ref 0.61–1.24)
GFR calc Af Amer: >60 mL/min (ref >60)
GFR calc non Af Amer: >60 mL/min (ref >60)
Glucose, Bld: 147 mg/dL — ABNORMAL HIGH (ref 70–99)
Potassium: 3.7 mmol/L (ref 3.5–5.1)
Sodium: 137 mmol/L (ref 135–145)
Total Bilirubin: 0.9 mg/dL (ref 0.3–1.2)
Total Protein: 8.8 g/dL — ABNORMAL HIGH (ref 6.5–8.1)

## 2019-08-28 LAB — CBC WITH DIFFERENTIAL/PLATELET
Abs Immature Granulocytes: 0.34 10*3/uL — ABNORMAL HIGH (ref 0.00–0.07)
Basophils Absolute: 0.1 10*3/uL (ref 0.0–0.1)
Basophils Relative: 0 %
Eosinophils Absolute: 0 10*3/uL (ref 0.0–0.5)
Eosinophils Relative: 0 %
HCT: 37 % — ABNORMAL LOW (ref 39.0–52.0)
Hemoglobin: 11.9 g/dL — ABNORMAL LOW (ref 13.0–17.0)
Immature Granulocytes: 1 %
Lymphocytes Relative: 5 %
Lymphs Abs: 1.5 10*3/uL (ref 0.7–4.0)
MCH: 33.7 pg (ref 26.0–34.0)
MCHC: 32.2 g/dL (ref 30.0–36.0)
MCV: 104.8 fL — ABNORMAL HIGH (ref 80.0–100.0)
Monocytes Absolute: 1.9 10*3/uL — ABNORMAL HIGH (ref 0.1–1.0)
Monocytes Relative: 6 %
Neutro Abs: 29.2 10*3/uL — ABNORMAL HIGH (ref 1.7–7.7)
Neutrophils Relative %: 88 %
Platelets: 592 10*3/uL — ABNORMAL HIGH (ref 150–400)
RBC: 3.53 MIL/uL — ABNORMAL LOW (ref 4.22–5.81)
RDW: 14.9 % (ref 11.5–15.5)
WBC: 33 10*3/uL — ABNORMAL HIGH (ref 4.0–10.5)
nRBC: 0.3 % — ABNORMAL HIGH (ref 0.0–0.2)

## 2019-08-28 LAB — GLUCOSE, CAPILLARY
Glucose-Capillary: 150 mg/dL — ABNORMAL HIGH (ref 70–99)
Glucose-Capillary: 152 mg/dL — ABNORMAL HIGH (ref 70–99)
Glucose-Capillary: 218 mg/dL — ABNORMAL HIGH (ref 70–99)
Glucose-Capillary: 298 mg/dL — ABNORMAL HIGH (ref 70–99)

## 2019-08-28 LAB — MAGNESIUM: Magnesium: 2.4 mg/dL (ref 1.7–2.4)

## 2019-08-28 LAB — LACTIC ACID, PLASMA
Lactic Acid, Venous: 5 mmol/L (ref 0.5–1.9)
Lactic Acid, Venous: 3.4 mmol/L (ref 0.5–1.9)

## 2019-08-28 LAB — D-DIMER, QUANTITATIVE: D-Dimer, Quant: 2.16 ug/mL-FEU — ABNORMAL HIGH (ref 0.00–0.50)

## 2019-08-28 LAB — HEMOGLOBIN A1C
Hgb A1c MFr Bld: 4.6 % — ABNORMAL LOW (ref 4.8–5.6)
Mean Plasma Glucose: 85.32 mg/dL

## 2019-08-28 LAB — PHOSPHORUS: Phosphorus: 2.7 mg/dL (ref 2.5–4.6)

## 2019-08-28 LAB — IRON AND TIBC
Iron: 23 ug/dL — ABNORMAL LOW (ref 45–182)
Saturation Ratios: 9 % — ABNORMAL LOW (ref 17.9–39.5)
TIBC: 255 ug/dL (ref 250–450)
UIBC: 232 ug/dL

## 2019-08-28 LAB — VITAMIN B12: Vitamin B-12: 3735 pg/mL — ABNORMAL HIGH (ref 180–914)

## 2019-08-28 LAB — PROCALCITONIN: Procalcitonin: 0.26 ng/mL

## 2019-08-28 LAB — FERRITIN: Ferritin: 996 ng/mL — ABNORMAL HIGH (ref 24–336)

## 2019-08-28 LAB — FOLATE: Folate: 20.7 ng/mL (ref >5.9)

## 2019-08-28 LAB — C-REACTIVE PROTEIN: CRP: 2.7 mg/dL — ABNORMAL HIGH (ref <1.0)

## 2019-08-28 MED ORDER — IOHEXOL 350 MG/ML SOLN
100.0000 mL | Freq: Once | INTRAVENOUS | Status: AC | PRN
  Administered 2019-08-28: 100 mL via INTRAVENOUS

## 2019-08-28 MED ORDER — ALBUTEROL SULFATE HFA 108 (90 BASE) MCG/ACT IN AERS
2.0000 | INHALATION_SPRAY | Freq: Three times a day (TID) | RESPIRATORY_TRACT | Status: DC
  Administered 2019-08-29 – 2019-08-30 (×5): 2 via RESPIRATORY_TRACT
  Filled 2019-08-28: qty 6.7

## 2019-08-28 MED ORDER — SODIUM CHLORIDE 0.9 % IV BOLUS
1000.0000 mL | Freq: Once | INTRAVENOUS | Status: AC
  Administered 2019-08-28: 1000 mL via INTRAVENOUS

## 2019-08-28 NOTE — Progress Notes (Signed)
Second 1000 ml Normal Saline bolus completed. Pt denies c/o, VSS. Congested, non-prod cough noted occasionally. Pt tolerating po food/fluids without difficulty.

## 2019-08-28 NOTE — Progress Notes (Addendum)
Notified by RN of elevated lactate 5.0 -suspect may be more related to hypoxia rather than overwhelming sepsis as pt is afebrile and hemodynamically stable -give fluid bolus -CTA chest -as stated earlier, pt is clinically better than 12/31 -blood culture remains neg and CRP+PCT decreasing   DTat

## 2019-08-28 NOTE — Plan of Care (Signed)

## 2019-08-28 NOTE — Progress Notes (Signed)
PROGRESS NOTE  [name] OVF:[id number] DOB: [birth year] DOA: 08/26/2019 PCP: Marguerita Merles, MD  Brief History:  74 year old male with history of DVT on apixaban, pulmonary hypertension, and chronic right lower extremity ulcer presenting with 2-day history of fevers, chills, nonproductive cough, shortness of breath.  Patient also has some loose stools approximate 24 hours prior to admission.  In addition, he stated that he lost his sense of taste on 08/22/2019.  He denied any nausea, vomiting, abdominal pain, dysuria, hematuria, headache, hemoptysis, hematochezia, melena.  In the emergency department, the patient was febrile up to 101.0 F and hypoxic requiring 4 L nasal cannula.  His COVID-19 test was positive.  BMP and LFTs were largely unremarkable.  WBC was 16.8 with hemoglobin 10.4 and platelets 499,000.  He had elevated inflammatory markers including CRP 15.4, ferritin 1204, and D-dimer 1.93.  Chest x-ray showed bilateral patchy infiltrates.  The patient was started on remdesivir and IV Solu-Medrol.  Assessment/Plan: Acute respiratory failure with hypoxia -Secondary to COVID-19 pneumonia -Stable presently on 3 L nasal cannula -Continue IV remdesivir and steroids -CRP 15.4>>> 2.7 -D-dimer 1.93>>> 2.16 -Ferritin 1204>>> 996 -Procalcitonin 0.82>>>0.26  History of DVT -Continue apixaban with which the patient has been compliant prior to admission  Leukocytosis -Suspect may be due to steroids versus spurious result as the patient is clinically improving -Repeat CBC with differential in a.m. -blood cultures remain negative -UA--neg for pyuria  Loose stool -Related to the patient's COVID-19 disease -only having one BM daily without abd pain  Thrombocytosis -Likely stress reaction -Check iron studies  Chronic Right foot/leg wound -good granulation tissue -no sign of infection  Hyperglycemia -likely stress and steroid induced -check A1C      Disposition  Plan:   Home in 2-3 days  Family Communication:   Family at bedside  Consultants:  none  Code Status:  FULL  DVT Prophylaxis:  apixaban   Procedures: As Listed in Progress Note Above  Antibiotics: None      Subjective: Patient denies fevers, chills, headache, chest pain, dyspnea, nausea, vomiting, diarrhea, abdominal pain, dysuria, hematuria, hematochezia, and melena.   Objective: Vitals:   08/27/19 2000 08/27/19 2100 08/27/19 2156 08/28/19 0506  BP: (!) 112/59 123/72 128/69 130/65  Pulse: 91 89 92 (!) 103  Resp: (!) 25 (!) 26 18 18   Temp:   98.4 F (36.9 C) 98.5 F (36.9 C)  TempSrc:   Oral Oral  SpO2: 95% 99% (!) 87% 92%  Weight:      Height:        Intake/Output Summary (Last 24 hours) at 08/28/2019 1232 Last data filed at 08/28/2019 1034 Gross per 24 hour  Intake 593.22 ml  Output 700 ml  Net -106.78 ml   Weight change:  Exam:   General:  Pt is alert, follows commands appropriately, not in acute distress  HEENT: No icterus, No thrush, No neck mass, [place]/AT  Cardiovascular: RRR, S1/S2, no rubs, no gallops  Respiratory: fine bibasilar rales. No wheeze  Abdomen: Soft/+BS, non tender, non distended, no guarding  Extremities: No edema, No lymphangitis, No petechiae, No rashes, no synovitis;  Right leg wound with good granulation--no odor, no pus, no necrosis   Data Reviewed: I have personally reviewed following labs and imaging studies Basic Metabolic Panel: Recent Labs  Lab 08/26/19 0446 08/27/19 0405 08/28/19 0746  NA 135 137 137  K 3.3* 4.1 3.7  CL 101 105 105  CO2 21* 20* 20*  GLUCOSE 118* 170* 147*  BUN 17 20 22   CREATININE 1.07 0.75 0.86  CALCIUM 8.7* 8.5* 8.6*  MG  --  2.3 2.4  PHOS  --  2.9 2.7   Liver Function Tests: Recent Labs  Lab 08/26/19 0446 08/27/19 0405 08/28/19 0746  AST 47* 40 40  ALT 26 26 29   ALKPHOS 63 59 67  BILITOT 2.1* 0.9 0.9  PROT 8.4* 7.8 8.8*  ALBUMIN 3.4* 2.9* 3.4*   No results for input(s): LIPASE,  AMYLASE in the last 168 hours. No results for input(s): AMMONIA in the last 168 hours. Coagulation Profile: Recent Labs  Lab 08/26/19 0446  INR 1.1   CBC: Recent Labs  Lab 08/26/19 0446 08/27/19 0405 08/28/19 0746  WBC 16.8* 7.1 33.0*  NEUTROABS 13.5* 5.8 29.2*  HGB 10.4* 9.7* 11.9*  HCT 31.3* 30.6* 37.0*  MCV 101.6* 103.4* 104.8*  PLT 461* 499* 592*   Cardiac Enzymes: No results for input(s): CKTOTAL, CKMB, CKMBINDEX, TROPONINI in the last 168 hours. BNP: Invalid input(s): POCBNP CBG: Recent Labs  Lab 08/27/19 0750 08/27/19 1254 08/27/19 2200 08/28/19 0745 08/28/19 1131  GLUCAP 159* 162* 212* 150* 152*   HbA1C: No results for input(s): HGBA1C in the last 72 hours. Urine analysis:    Component Value Date/Time   COLORURINE YELLOW 08/26/2019 0404   APPEARANCEUR CLEAR 08/26/2019 0404   APPEARANCEUR Hazy 04/13/2014 1629   LABSPEC 1.013 08/26/2019 0404   LABSPEC 1.013 04/13/2014 1629   PHURINE 6.0 08/26/2019 0404   GLUCOSEU NEGATIVE 08/26/2019 0404   GLUCOSEU Negative 04/13/2014 1629   HGBUR NEGATIVE 08/26/2019 0404   BILIRUBINUR NEGATIVE 08/26/2019 0404   BILIRUBINUR Negative 04/13/2014 1629   KETONESUR NEGATIVE 08/26/2019 0404   PROTEINUR 100 (A) 08/26/2019 0404   UROBILINOGEN 1.0 04/15/2009 0853   NITRITE NEGATIVE 08/26/2019 0404   LEUKOCYTESUR NEGATIVE 08/26/2019 0404   LEUKOCYTESUR Negative 04/13/2014 1629   Sepsis Labs: @LABRCNTIP (procalcitonin:4,lacticidven:4) ) Recent Results (from the past 240 hour(s))  Urine culture     Status: Abnormal   Collection Time: 08/26/19  4:04 AM   Specimen: In/Out Cath Urine  Result Value Ref Range Status   Specimen Description   Final    IN/OUT CATH URINE Performed at [facility], [address], [place], [facility] [address]    Special Requests   Final    NONE Performed at [facility], [address] [place], [facility] [address]    Culture (A)  Final    <10,000 COLONIES/mL INSIGNIFICANT GROWTH Performed at  [facility], [address], [place], [facility] [address]    Report Status 08/27/2019 FINAL  Final  Blood Culture (routine x 2)     Status: None (Preliminary result)   Collection Time: 08/26/19  4:50 AM   Specimen: BLOOD RIGHT ARM  Result Value Ref Range Status   Specimen Description BLOOD RIGHT ARM  Final   Special Requests   Final    BOTTLES DRAWN AEROBIC AND ANAEROBIC Blood Culture adequate volume   Culture   Final    NO GROWTH 2 DAYS Performed at [facility], [address] [place], [facility] [address]    Report Status PENDING  Incomplete  Blood Culture (routine x 2)     Status: None (Preliminary result)   Collection Time: 08/26/19  4:55 AM   Specimen: BLOOD RIGHT HAND  Result Value Ref Range Status   Specimen Description BLOOD RIGHT HAND  Final   Special Requests   Final    BOTTLES DRAWN AEROBIC AND  ANAEROBIC Blood Culture adequate volume   Culture   Final    NO GROWTH 2 DAYS Performed at [facility], [address] [place], [place] [address]    Report Status PENDING  Incomplete  SARS CORONAVIRUS 2 (Lelon Ikard 6-24 HRS) Nasopharyngeal Nasopharyngeal Swab     Status: Abnormal   Collection Time: 08/26/19  5:03 AM   Specimen: Nasopharyngeal Swab  Result Value Ref Range Status   SARS Coronavirus 2 POSITIVE (A) NEGATIVE Final    Comment: emailed L. Berdik RN 14:15 08/26/19 ([email or web address]) (NOTE) SARS-CoV-2 target nucleic acids are DETECTED. The SARS-CoV-2 RNA is generally detectable in upper and lower respiratory specimens during the acute phase of infection. Positive results are indicative of the presence of SARS-CoV-2 RNA. Clinical correlation with patient history and other diagnostic information is  necessary to determine patient infection status. Positive results do not rule out bacterial infection or co-infection with other viruses.  The expected result is Negative. Fact Sheet for Patients: HairSlick.no Fact Sheet for Healthcare  Providers: quierodirigir.com This test is not yet approved or cleared by the [place] FDA and  has been authorized for detection and/or diagnosis of SARS-CoV-2 by FDA under an Emergency Use Authorization (EUA). This EUA will remain  in effect (meaning this test can be used) for the duration of the COVID-19 declaration un der Section 564(b)(1) of the Act, 21 U.S.C. section 360bbb-3(b)(1), unless the authorization is terminated or revoked sooner. Performed at [facility], [address], [place], [place] [address]   Respiratory Panel by RT PCR (Flu A&B, Covid) - Nasopharyngeal Swab     Status: Abnormal   Collection Time: 08/26/19  9:15 AM   Specimen: Nasopharyngeal Swab  Result Value Ref Range Status   SARS Coronavirus 2 by RT PCR POSITIVE (A) NEGATIVE Final    Comment: RESULT CALLED TO, READ BACK BY AND VERIFIED WITH: FARMER,E. AT 1050 ON 08/26/2019 BY BAUGHAM,M. (NOTE) SARS-CoV-2 target nucleic acids are DETECTED. SARS-CoV-2 RNA is generally detectable in upper respiratory specimens  during the acute phase of infection. Positive results are indicative of the presence of the identified virus, but do not rule out bacterial infection or co-infection with other pathogens not detected by the test. Clinical correlation with patient history and other diagnostic information is necessary to determine patient infection status. The expected result is Negative. Fact Sheet for Patients:  https://www.moore.com/ Fact Sheet for Healthcare Providers: https://www.young.biz/ This test is not yet approved or cleared by the [place] FDA and  has been authorized for detection and/or diagnosis of SARS-CoV-2 by FDA under an Emergency Use Authorization (EUA).  This EUA will remain in effect (meaning this test can  be used) for the duration of  the COVID-19 declaration under Section 564(b)(1) of the Act, 21 U.S.C. section  360bbb-3(b)(1), unless the authorization is terminated or revoked sooner.    Influenza A by PCR NEGATIVE NEGATIVE Final   Influenza B by PCR NEGATIVE NEGATIVE Final    Comment: (NOTE) The Xpert Xpress SARS-CoV-2/FLU/RSV assay is intended as an aid in  the diagnosis of influenza from Nasopharyngeal swab specimens and  should not be used as a sole basis for treatment. Nasal washings and  aspirates are unacceptable for Xpert Xpress SARS-CoV-2/FLU/RSV  testing. Fact Sheet for Patients: https://www.moore.com/ Fact Sheet for Healthcare Providers: https://www.young.biz/ This test is not yet approved or cleared by the [place] FDA and  has been authorized for detection and/or diagnosis of SARS-CoV-2 by  FDA under an Emergency Use Authorization (EUA). This  EUA will remain  in effect (meaning this test can be used) for the duration of the  Covid-19 declaration under Section 564(b)(1) of the Act, 21  U.S.C. section 360bbb-3(b)(1), unless the authorization is  terminated or revoked. Performed at [facility], [address], [place], [place] [address]      Scheduled Meds: . albuterol  2 puff Inhalation Q6H  . apixaban  5 mg Oral BID  . vitamin C  500 mg Oral Daily  . folic acid  1 mg Oral Daily  . guaiFENesin  600 mg Oral BID  . insulin aspart  0-5 Units Subcutaneous QHS  . insulin aspart  0-6 Units Subcutaneous TID WC  . loratadine  10 mg Oral Daily  . methylPREDNISolone (SOLU-MEDROL) injection  60 mg Intravenous Q12H  . multivitamin with minerals  1 tablet Oral Daily  . sodium chloride flush  3 mL Intravenous Q12H  . thiamine  100 mg Oral Daily  . zinc sulfate  220 mg Oral Daily   Continuous Infusions: . sodium chloride    . azithromycin 500 mg (08/28/19 3570)  . cefTRIAXone (ROCEPHIN)  IV 2 g (08/28/19 0537)  . remdesivir 100 mg in NS 100 mL 100 mg (08/28/19 1025)    Procedures/Studies: DG Chest Port 1 View  Result Date:  08/26/2019 CLINICAL DATA:  Fever and shortness of breath EXAM: PORTABLE CHEST 1 VIEW COMPARISON:  10/21/2014 FINDINGS: Patchy bilateral pulmonary infiltrate greater on the right. Generous heart size that is stable. No effusion or pneumothorax IMPRESSION: Patchy bilateral pneumonia. Electronically Signed   By: Marnee Spring M.D.   On: 08/26/2019 04:33    Catarina Hartshorn, DO  [facility] Pager [phone]  If 7PM-7AM, please contact night-coverage [email or web address] Password [email or web address] 08/28/2019, 12:32 PM   LOS: 2 days

## 2019-08-28 NOTE — Progress Notes (Signed)
CRITICAL VALUE ALERT  Critical Value:  Lactic Acid 5.0  Date & Time Noted:  08/28/2019 @ 1512  Provider Notified: Dr. Arbutus Leas  Orders Received/Actions taken: None yet at this time

## 2019-08-28 NOTE — Progress Notes (Signed)
Pt to CT via wheelchair

## 2019-08-28 NOTE — Progress Notes (Signed)
Pt back to room from CT. IVF bolus continues, was stopped during CT scan per CT staff. Pt remains with 850 ml to infuse.

## 2019-08-29 LAB — COMPREHENSIVE METABOLIC PANEL
ALT: 42 U/L (ref 0–44)
AST: 55 U/L — ABNORMAL HIGH (ref 15–41)
Albumin: 2.7 g/dL — ABNORMAL LOW (ref 3.5–5.0)
Alkaline Phosphatase: 62 U/L (ref 38–126)
Anion gap: 10 (ref 5–15)
BUN: 19 mg/dL (ref 8–23)
CO2: 22 mmol/L (ref 22–32)
Calcium: 8.6 mg/dL — ABNORMAL LOW (ref 8.9–10.3)
Chloride: 111 mmol/L (ref 98–111)
Creatinine, Ser: 0.82 mg/dL (ref 0.61–1.24)
GFR calc Af Amer: >60 mL/min (ref >60)
GFR calc non Af Amer: >60 mL/min (ref >60)
Glucose, Bld: 135 mg/dL — ABNORMAL HIGH (ref 70–99)
Potassium: 4.1 mmol/L (ref 3.5–5.1)
Sodium: 143 mmol/L (ref 135–145)
Total Bilirubin: 1.1 mg/dL (ref 0.3–1.2)
Total Protein: 7 g/dL (ref 6.5–8.1)

## 2019-08-29 LAB — CBC WITH DIFFERENTIAL/PLATELET
Abs Immature Granulocytes: 0.29 10*3/uL — ABNORMAL HIGH (ref 0.00–0.07)
Basophils Absolute: 0 10*3/uL (ref 0.0–0.1)
Basophils Relative: 0 %
Eosinophils Absolute: 0 10*3/uL (ref 0.0–0.5)
Eosinophils Relative: 0 %
HCT: 27.9 % — ABNORMAL LOW (ref 39.0–52.0)
Hemoglobin: 9.2 g/dL — ABNORMAL LOW (ref 13.0–17.0)
Immature Granulocytes: 1 %
Lymphocytes Relative: 3 %
Lymphs Abs: 1 10*3/uL (ref 0.7–4.0)
MCH: 33.7 pg (ref 26.0–34.0)
MCHC: 33 g/dL (ref 30.0–36.0)
MCV: 102.2 fL — ABNORMAL HIGH (ref 80.0–100.0)
Monocytes Absolute: 2.3 10*3/uL — ABNORMAL HIGH (ref 0.1–1.0)
Monocytes Relative: 8 %
Neutro Abs: 26.7 10*3/uL — ABNORMAL HIGH (ref 1.7–7.7)
Neutrophils Relative %: 88 %
Platelets: 633 10*3/uL — ABNORMAL HIGH (ref 150–400)
RBC: 2.73 MIL/uL — ABNORMAL LOW (ref 4.22–5.81)
RDW: 15.7 % — ABNORMAL HIGH (ref 11.5–15.5)
WBC: 30.3 10*3/uL — ABNORMAL HIGH (ref 4.0–10.5)
nRBC: 0.4 % — ABNORMAL HIGH (ref 0.0–0.2)

## 2019-08-29 LAB — C-REACTIVE PROTEIN: CRP: 1.1 mg/dL — ABNORMAL HIGH (ref <1.0)

## 2019-08-29 LAB — GLUCOSE, CAPILLARY
Glucose-Capillary: 129 mg/dL — ABNORMAL HIGH (ref 70–99)
Glucose-Capillary: 160 mg/dL — ABNORMAL HIGH (ref 70–99)
Glucose-Capillary: 145 mg/dL — ABNORMAL HIGH (ref 70–99)
Glucose-Capillary: 127 mg/dL — ABNORMAL HIGH (ref 70–99)

## 2019-08-29 LAB — PROCALCITONIN: Procalcitonin: 0.17 ng/mL

## 2019-08-29 LAB — PHOSPHORUS: Phosphorus: 3.4 mg/dL (ref 2.5–4.6)

## 2019-08-29 LAB — FERRITIN: Ferritin: 701 ng/mL — ABNORMAL HIGH (ref 24–336)

## 2019-08-29 LAB — MAGNESIUM: Magnesium: 2.3 mg/dL (ref 1.7–2.4)

## 2019-08-29 LAB — D-DIMER, QUANTITATIVE: D-Dimer, Quant: 2.13 ug/mL-FEU — ABNORMAL HIGH (ref 0.00–0.50)

## 2019-08-29 MED ORDER — ZINC SULFATE 220 (50 ZN) MG PO CAPS: 220.0000 mg | ORAL_CAPSULE | Freq: Every day | ORAL | Status: DC

## 2019-08-29 MED ORDER — ASCORBIC ACID 500 MG PO TABS: 500.0000 mg | ORAL_TABLET | Freq: Every day | ORAL | Status: AC

## 2019-08-29 MED ORDER — APIXABAN 5 MG PO TABS: 5.0000 mg | ORAL_TABLET | Freq: Two times a day (BID) | ORAL | Status: DC

## 2019-08-29 NOTE — Consult Note (Signed)
WOC Nurse wound consult note Consultation was completed by review of records, images and assistance from the bedside nurse/clinical staff.  Reason for Consult: lower extremity wounds After review of record; it looks as if he as been followed in the past by wound care center (Dr. Leanord Hawking) and by vascular service Wound type: full thickness ulcerations RLE Pressure Injury POA: NA Measurement: see nursing flow sheet Wound bed: clean; pale; appear dry; punched out appearance Drainage (amount, consistency, odor) see nursing flow sheet Periwound: intact Dressing procedure/placement/frequency: Chronic wounds; hydrogel daily.   Follow up in wound care center or with [facility]VS as an outpatient   Re consult if needed, will not follow at this time. Thanks  Melaney Tellefsen [employer], RN,CWOCN, CNS, CWON-AP [phone])

## 2019-08-29 NOTE — Plan of Care (Signed)

## 2019-08-29 NOTE — Progress Notes (Signed)
Pt A&O x4 this am. States did not sleep well last pm despite po Trazadone at bedtime. Denies any current complaint. Resp even and non-labored at rest, states some increased SOB with up to BR. Reminded pt to be sure to wear O2 while OOB, stated understanding.

## 2019-08-29 NOTE — Progress Notes (Signed)
PROGRESS NOTE  [name] EXB:[id number] DOB: 05-Mar-1946 DOA: 08/26/2019 PCP: Leanna Sato, MD Brief History:  74 year old male with history of DVT on apixaban, pulmonary hypertension(CTEPH), and chronic right lower extremity ulcer presenting with 2-day history  of fevers, chills, nonproductive cough, shortness of breath.  Patient also has some loose stools approximate 24 hours prior to admission.  In addition, he stated that he lost his sense of taste on 08/22/2019.  He denied any nausea, vomiting, abdominal pain, dysuria, hematuria, headache, hemoptysis, hematochezia, melena.  In the emergency department, the patient was febrile up to 101.0 F and hypoxic requiring 4 L nasal cannula.  His COVID-19 test was positive.  BMP and LFTs were largely unremarkable.  WBC was 16.8 with hemoglobin 10.4 and platelets 499,000.  He had elevated inflammatory markers including CRP 15.4, ferritin 1204, and D-dimer 1.93.  Chest x-ray showed bilateral patchy infiltrates.  The patient was started on remdesivir and IV Solu-Medrol.  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 -blood cultures remain negative -UA--neg for pyuria -remains afebrile and hemodynamically stable  Loose stool -Related to the patient's COVID-19 disease -only having one BM daily without abd pain  Thrombocytosis -stress reaction and  iron deficiency  Chronic Right foot/leg wound -good granulation tissue -no sign of infection -see pictures below  Hyperglycemia -likely stress and steroid induced -check A1C--4.6      Disposition Plan:   Home 1/3 if stable Family Communication:   Family at bedside  Consultants:  none  Code Status:  FULL  DVT Prophylaxis:  apixaban   Procedures: As Listed in Progress Note Above  Antibiotics: None      Subjective: Patient denies fevers, chills, headache, chest pain, dyspnea, nausea, vomiting, diarrhea, abdominal pain, dysuria, hematuria, hematochezia, and melena.   Objective: Vitals:   08/28/19 2013 08/28/19 2023 08/29/19 0507 08/29/19 0515  BP: (!) 130/59  111/63   Pulse: (!) 116 98 86   Resp: 20  20   Temp: 98.3 F (36.8 C)  98.6 F (37 C)   TempSrc: Oral  Oral   SpO2: 92%  (!) 89% 90%  Weight:      Height:        Intake/Output Summary (Last 24 hours) at 08/29/2019 1136 Last data filed at 08/29/2019 1121 Gross per 24 hour  Intake 1538.47 ml  Output 1885 ml  Net -346.53 ml   Weight change:  Exam:   General:  Pt is alert, follows commands appropriately, not in acute distress  HEENT: No icterus, No thrush, No neck mass, [place]/AT  Cardiovascular: RRR, S1/S2, no rubs, no gallops  Respiratory: CTA bilaterally, no wheezing, no crackles, no rhonchi  Abdomen: Soft/+BS, non tender, non distended, no guarding  Extremities: No edema, No lymphangitis, No petechiae, No rashes, no synovitis       Data Reviewed: I have personally reviewed following labs and imaging studies Basic Metabolic Panel: Recent Labs  Lab 08/26/19 0446 08/27/19 0405 08/28/19 0746 08/29/19 0730  NA  135 137 137 143  K 3.3* 4.1 3.7 4.1  CL 101 105 105 111  CO2 21* 20* 20* 22  GLUCOSE 118* 170* 147* 135*  BUN 17 20 22 19   CREATININE 1.07 0.75 0.86 0.82  CALCIUM 8.7* 8.5* 8.6* 8.6*  MG  --  2.3 2.4 2.3  PHOS  --  2.9 2.7 3.4   Liver Function Tests: Recent  Labs  Lab 08/26/19 0446 08/27/19 0405 08/28/19 0746 08/29/19 0730  AST 47* 40 40 55*  ALT 26 26 29  42  ALKPHOS 63 59 67 62  BILITOT 2.1* 0.9 0.9 1.1  PROT 8.4* 7.8 8.8* 7.0  ALBUMIN 3.4* 2.9* 3.4* 2.7*   No results for input(s): LIPASE, AMYLASE in the last 168 hours. No results for input(s): AMMONIA in the last 168 hours. Coagulation Profile: Recent Labs  Lab 08/26/19 0446  INR 1.1   CBC: Recent Labs  Lab 08/26/19 0446 08/27/19 0405 08/28/19 0746 08/29/19 0730  WBC 16.8* 7.1 33.0* 30.3*  NEUTROABS 13.5* 5.8 29.2* 26.7*  HGB 10.4* 9.7* 11.9* 9.2*  HCT 31.3* 30.6* 37.0* 27.9*  MCV 101.6* 103.4* 104.8* 102.2*  PLT 461* 499* 592* 633*   Cardiac Enzymes: No results for input(s): CKTOTAL, CKMB, CKMBINDEX, TROPONINI in the last 168 hours. BNP: Invalid input(s): POCBNP CBG: Recent Labs  Lab 08/28/19 1131 08/28/19 1637 08/28/19 2101 08/29/19 0740 08/29/19 1120  GLUCAP 152* 218* 298* 129* 160*   HbA1C: Recent Labs    08/28/19 1345  HGBA1C 4.6*   Urine analysis:    Component Value Date/Time   COLORURINE YELLOW 08/26/2019 0404   APPEARANCEUR CLEAR 08/26/2019 0404   APPEARANCEUR Hazy 04/13/2014 1629   LABSPEC 1.013 08/26/2019 0404   LABSPEC 1.013 04/13/2014 1629   PHURINE 6.0 08/26/2019 0404   GLUCOSEU NEGATIVE 08/26/2019 0404   GLUCOSEU Negative 04/13/2014 1629   HGBUR NEGATIVE 08/26/2019 0404   BILIRUBINUR NEGATIVE 08/26/2019 0404   BILIRUBINUR Negative 04/13/2014 1629   KETONESUR NEGATIVE 08/26/2019 0404   PROTEINUR 100 (A) 08/26/2019 0404   UROBILINOGEN 1.0 04/15/2009 0853   NITRITE NEGATIVE 08/26/2019 0404   LEUKOCYTESUR NEGATIVE 08/26/2019 0404   LEUKOCYTESUR Negative 04/13/2014 1629   Sepsis Labs: @LABRCNTIP (procalcitonin:4,lacticidven:4) ) Recent Results (from the past 240 hour(s))  Urine culture     Status: Abnormal   Collection Time: 08/26/19  4:04 AM   Specimen: In/Out Cath Urine  Result Value Ref Range Status   Specimen Description    Final    IN/OUT CATH URINE Performed at [facility], [address], [place], [facility] [address]    Special Requests   Final    NONE Performed at [facility], [address], [place], [facility] [address]    Culture (A)  Final    <10,000 COLONIES/mL INSIGNIFICANT GROWTH Performed at [facility], [address], [place], [facility] [address]    Report Status 08/27/2019 FINAL  Final  Blood Culture (routine x 2)     Status: None (Preliminary result)   Collection Time: 08/26/19  4:50 AM   Specimen: BLOOD RIGHT ARM  Result Value Ref Range Status   Specimen Description BLOOD RIGHT ARM  Final   Special Requests   Final    BOTTLES DRAWN AEROBIC AND ANAEROBIC Blood Culture adequate volume   Culture   Final    NO GROWTH 3 DAYS Performed at [facility], [address], [place], [facility] [address]    Report Status PENDING  Incomplete  Blood Culture (routine x 2)  Status: None (Preliminary result)   Collection Time: 08/26/19  4:55 AM   Specimen: BLOOD RIGHT HAND  Result Value Ref Range Status   Specimen Description BLOOD RIGHT HAND  Final   Special Requests   Final    BOTTLES DRAWN AEROBIC AND ANAEROBIC Blood Culture adequate volume   Culture   Final    NO GROWTH 3 DAYS Performed at [facility], [address] [place], [place] [address]    Report Status PENDING  Incomplete  SARS CORONAVIRUS 2 (Eisen Robenson 6-24 HRS) Nasopharyngeal Nasopharyngeal Swab     Status: Abnormal   Collection Time: 08/26/19  5:03 AM   Specimen: Nasopharyngeal Swab  Result Value Ref Range Status   SARS Coronavirus 2 POSITIVE (A) NEGATIVE Final    Comment: emailed L. Berdik RN 14:15 08/26/19 ([email or web address]) (NOTE) SARS-CoV-2 target nucleic acids are DETECTED. The SARS-CoV-2 RNA is generally detectable in upper and lower respiratory specimens during the acute phase of infection. Positive results are indicative of the presence of SARS-CoV-2 RNA. Clinical correlation with patient history and other diagnostic information is   necessary to determine patient infection status. Positive results do not rule out bacterial infection or co-infection with other viruses.  The expected result is Negative. Fact Sheet for Patients: SugarRoll.be Fact Sheet for Healthcare Providers: https://www.woods-mathews.com/ This test is not yet approved or cleared by the [place] FDA and  has been authorized for detection and/or diagnosis of SARS-CoV-2 by FDA under an Emergency Use Authorization (EUA). This EUA will remain  in effect (meaning this test can be used) for the duration of the COVID-19 declaration un der Section 564(b)(1) of the Act, 21 U.S.C. section 360bbb-3(b)(1), unless the authorization is terminated or revoked sooner. Performed at [facility], [place] [address], [place],  [address]   Respiratory Panel by RT PCR (Flu A&B, Covid) - Nasopharyngeal Swab     Status: Abnormal   Collection Time: 08/26/19  9:15 AM   Specimen: Nasopharyngeal Swab  Result Value Ref Range Status   SARS Coronavirus 2 by RT PCR POSITIVE (A) NEGATIVE Final    Comment: RESULT CALLED TO, READ BACK BY AND VERIFIED WITH: FARMER,E. AT 1050 ON 08/26/2019 BY BAUGHAM,M. (NOTE) SARS-CoV-2 target nucleic acids are DETECTED. SARS-CoV-2 RNA is generally detectable in upper respiratory specimens  during the acute phase of infection. Positive results are indicative of the presence of the identified virus, but do not rule out bacterial infection or co-infection with other pathogens not detected by the test. Clinical correlation with patient history and other diagnostic information is necessary to determine patient infection status. The expected result is Negative. Fact Sheet for Patients:  PinkCheek.be Fact Sheet for Healthcare Providers: GravelBags.it This test is not yet approved or cleared by the [place] FDA and  has been  authorized for detection and/or diagnosis of SARS-CoV-2 by FDA under an Emergency Use Authorization (EUA).  This EUA will remain in effect (meaning this test can  be used) for the duration of  the COVID-19 declaration under Section 564(b)(1) of the Act, 21 U.S.C. section 360bbb-3(b)(1), unless the authorization is terminated or revoked sooner.    Influenza A by PCR NEGATIVE NEGATIVE Final   Influenza B by PCR NEGATIVE NEGATIVE Final    Comment: (NOTE) The Xpert Xpress SARS-CoV-2/FLU/RSV assay is intended as an aid in  the diagnosis of influenza from Nasopharyngeal swab specimens and  should not be used as a sole basis for treatment. Nasal washings and  aspirates are unacceptable for Xpert Xpress SARS-CoV-2/FLU/RSV  testing. Fact Sheet for Patients: https://www.moore.com/ Fact Sheet for Healthcare Providers: https://www.young.biz/ This test is not yet approved or cleared by the [place] FDA and  has been authorized for detection and/or diagnosis of SARS-CoV-2 by  FDA under an Emergency Use Authorization (EUA). This EUA will remain  in effect (meaning this test can be used) for the duration of the  Covid-19 declaration under Section 564(b)(1) of the Act, 21  U.S.C. section 360bbb-3(b)(1), unless the authorization is  terminated or revoked. Performed at [facility], [address] [place], [place] [address]      Scheduled Meds: . albuterol  2 puff Inhalation TID  . apixaban  5 mg Oral BID  . vitamin C  500 mg Oral Daily  . folic acid  1 mg Oral Daily  . guaiFENesin  600 mg Oral BID  . insulin aspart  0-5 Units Subcutaneous QHS  . insulin aspart  0-6 Units Subcutaneous TID WC  . loratadine  10 mg Oral Daily  . methylPREDNISolone (SOLU-MEDROL) injection  60 mg Intravenous Q12H  . multivitamin with minerals  1 tablet Oral Daily  . sodium chloride flush  3 mL Intravenous Q12H  . thiamine  100 mg Oral Daily  . zinc sulfate  220 mg Oral  Daily   Continuous Infusions: . sodium chloride 250 mL (08/28/19 1828)  . azithromycin 500 mg (08/29/19 0550)  . cefTRIAXone (ROCEPHIN)  IV 2 g (08/29/19 0514)    Procedures/Studies: CT ANGIO CHEST PE W OR WO CONTRAST  Result Date: 08/28/2019 CLINICAL DATA:  Hypoxemia, elevated D-dimer EXAM: CT ANGIOGRAPHY CHEST WITH CONTRAST TECHNIQUE: Multidetector CT imaging of the chest was performed using the standard protocol during bolus administration of intravenous contrast. Multiplanar CT image reconstructions and MIPs were obtained to evaluate the vascular anatomy. CONTRAST:  OMNIPAQUE IOHEXOL 350 MG/ML SOLN COMPARISON:  Chest radiograph, 08/26/2019, CT chest angiogram, 10/21/2014 FINDINGS: Cardiovascular: Satisfactory opacification of the pulmonary arteries to the segmental level. No evidence of acute pulmonary embolism. Suspect residua of chronic embolus, for example subtle calcification within the right lower lobar segmental vessels (series 9, image 71). Normal heart size. Left coronary artery calcifications. No pericardial effusion. Enlargement of the main pulmonary artery to 4.7 cm. Mediastinum/Nodes: Unchanged prominent mediastinal lymph nodes. Thyroid gland, trachea, and esophagus demonstrate no significant findings. Lungs/Pleura: Extensive irregular bilateral ground-glass airspace opacity. Underlying chronic bibasilar scarring or fibrosis. Mild dependent bronchiectasis. No pleural effusion or pneumothorax. Upper Abdomen: No acute abnormality. Nonobstructive left nephrolithiasis. Musculoskeletal: No chest wall abnormality. No acute or significant osseous findings. Review of the MIP images confirms the above findings. IMPRESSION: 1. Negative examination for acute pulmonary embolism. 2. Suspect residua of chronic embolus, for example subtle calcification within the right lower lobar segmental vessels. Enlargement of the main pulmonary artery to 4.7 cm. 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 3. Extensive irregular, predominantly peripheral bilateral ground-glass airspace opacity, findings consistent with multifocal infection, particularly COVID-19 if suspected. 4. Underlying chronic bibasilar scarring or fibrosis and bronchiectasis. 5. Coronary artery disease. 6. Aortic Atherosclerosis (ICD10-I70.0). 7. Nonobstructive left nephrolithiasis. Electronically Signed   By: Lauralyn Primes M.D.   On: 08/28/2019 16:59   DG Chest Port 1 View  Result Date: 08/26/2019 CLINICAL DATA:  Fever and shortness of breath EXAM: PORTABLE CHEST 1 VIEW COMPARISON:  10/21/2014 FINDINGS: Patchy bilateral pulmonary infiltrate greater on the right. Generous heart size that is stable. No effusion or pneumothorax IMPRESSION: Patchy bilateral pneumonia. Electronically Signed   By: Kathrynn Ducking.D.  On: 08/26/2019 04:33    Catarina HartshornDavid Tavaria Mackins, DO  [facility] Pager [phone]  If 7PM-7AM, please contact night-coverage [email or web address] Password [email or web address] 08/29/2019, 11:36 AM   LOS: 3 days

## 2019-08-29 NOTE — Progress Notes (Signed)
Entered pt's room to find him up walking around room without oxygen. "I need to stretch my legs some." Pt tachypneic, 32/min. Checked SpO2 while up and off O2, SaO2 only 84%, heart rate 119/min. Had pt sit back on bed and reapply O2 and rest. Pt's SaO2 eventually up to 92-94% on 2lpm [place], heart rate down into the low 90"s and resp rate down to 24-25/min. Advised pt to keep O2 on while he is up and moving around. Pt has long O2 extension tubing intact. Pt stated understanding.

## 2019-08-29 NOTE — Discharge Summary (Signed)
Physician Discharge Summary  [name]:[id number] DOB: [birth year] DOA: 08/26/2019  PCP: Marguerita Merles, MD  Admit date: 08/26/2019 Discharge date: 08/30/2019  Admitted From: Home Disposition:  Home   Recommendations for Outpatient Follow-up:  1. Follow up with PCP in 1-2 weeks 2. Please obtain BMP/CBC in one week 3. Please consider referral to heme/onc to eval for possible MDS 4. Return to wound care center to follow with Dr. Dellia Nims    Discharge Condition: Stable CODE STATUS: FULL Diet recommendation: Regular   Brief/Interim Summary: 74 year old male with history of DVT on apixaban, pulmonary hypertension(CTEPH), and chronic right lower extremity ulcer presenting with 2-day history  of fevers, chills, nonproductive cough, shortness of breath. Patient also has some loose stools approximate 24 hours prior to admission. In addition, he stated that he lost his sense of taste on 08/22/2019. He denied any nausea, vomiting, abdominal pain, dysuria, hematuria, headache, hemoptysis, hematochezia, melena. In the emergency department, the patient was febrile up to 101.0 F andhypoxic requiring 4 L nasal cannula. His COVID-19 test was positive. BMP and LFTs were largely unremarkable. WBC was 16.8 with hemoglobin 10.4 and platelets 499,000. He had elevated inflammatory markers including CRP 15.4, ferritin 1204, and D-dimer 1.93. Chest x-ray showed bilateral patchy infiltrates. The patient was started on remdesivir and IV Solu-Medrol.  He improved clinically and finished 5 days remdesivir.  He did not have any further dyspnea but desaturated with ambulation.  He will be sent home with 3 L Elmer  Discharge Diagnoses:  Acute respiratory failure with hypoxia -Secondary to COVID-19 pneumonia -Stable presentlyon 3 L nasal cannula -Continue IV remdesivir--finished 5 days -d/c home with 5 more days of steroids -CRP 15.4>>>2.7>>>1.1 -D-dimer  1.93>>>2.16 -Ferritin1204>>>996>>701 -Procalcitonin0.82>>>0.26 -08/28/19 CTA chest--no acute PE -ambulatory pulse ox on RA on day of dc showed desaturation <88%-->set up 3L -suspect pt's COPD has contributed for his need for oxygent as pt's is significantly improved clinically without dyspnea -d/c home with cefdinir and azithro x 2 more days to finish one week  History of DVT -Continue apixaban with which the patient has been compliant prior to admission  Leukocytosis/Leukemoid reaction -stress leukemoid reaction vs steroids -cannot completely rule out MDS -medical records reviewed and 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  -outpt hematology eval -Repeat CBC with differential in one week after d/c -blood cultures remain negative -UA--neg for pyuria -remains afebrile and hemodynamically stable  Loose stool -Related to the patient's COVID-19 disease -only having one BM daily without abd pain  Thrombocytosis -stress reaction and iron deficiency -start po iron after d/c once stable -iron saturation 9%  Chronic Right foot/leg wound -good granulation tissue -no sign of infection -continue wound care with silver alginate  Hyperglycemia -likely stress and steroid induced -check A1C--4.6    Discharge Instructions   Allergies as of 08/30/2019   No Known Allergies     Medication List    TAKE these medications   apixaban 5 MG Tabs tablet Commonly known as: ELIQUIS Take 1 tablet (5 mg total) by mouth 2 (two) times daily.   ascorbic acid 500 MG tablet Commonly known as: VITAMIN C Take 1 tablet (500 mg total) by mouth daily.   azithromycin 500 MG tablet Commonly known as: ZITHROMAX Take 1 tablet (500 mg total) by mouth daily. Start taking on: August 31, 2019   B-12 1000 MCG Caps Take 1 capsule by mouth daily.   cefdinir 300 MG capsule Commonly known as: OMNICEF Take 1 capsule (300 mg total)  by mouth 2 (two) times daily. Start taking on:  August 31, 2019   Multi-Vitamins Tabs Take 1 tablet by mouth daily.   predniSONE 20 MG tablet Commonly known as: DELTASONE Take 3 tablets (60 mg total) by mouth 2 (two) times daily with a meal.   zinc sulfate 220 (50 Zn) MG capsule Take 1 capsule (220 mg total) by mouth daily.            Durable Medical Equipment  (From admission, onward)         Start     Ordered   08/30/19 1227  For home use only DME oxygen  Once    Question Answer Comment  Length of Need 6 Months   Mode or (Route) Nasal cannula   Liters per Minute 3   Frequency Continuous (stationary and portable oxygen unit needed)   Oxygen conserving device Yes   Oxygen delivery system Gas      08/30/19 1227         Follow-up Information    Doreatha Massed, MD Follow up in 2 week(s).   Specialty: Hematology Contact information: [address] [place] [address] [phone]          No Known Allergies  Consultations:  none   Procedures/Studies: CT ANGIO CHEST PE W OR WO CONTRAST  Result Date: 08/28/2019 CLINICAL DATA:  Hypoxemia, elevated D-dimer EXAM: CT ANGIOGRAPHY CHEST WITH CONTRAST TECHNIQUE: Multidetector CT imaging of the chest was performed using the standard protocol during bolus administration of intravenous contrast. Multiplanar CT image reconstructions and MIPs were obtained to evaluate the vascular anatomy. CONTRAST:  OMNIPAQUE IOHEXOL 350 MG/ML SOLN COMPARISON:  Chest radiograph, 08/26/2019, CT chest angiogram, 10/21/2014 FINDINGS: Cardiovascular: Satisfactory opacification of the pulmonary arteries to the segmental level. No evidence of acute pulmonary embolism. Suspect residua of chronic embolus, for example subtle calcification within the right lower lobar segmental vessels (series 9, image 71). Normal heart size. Left coronary artery calcifications. No pericardial effusion. Enlargement of the main pulmonary artery to 4.7 cm. Mediastinum/Nodes: Unchanged prominent mediastinal  lymph nodes. Thyroid gland, trachea, and esophagus demonstrate no significant findings. Lungs/Pleura: Extensive irregular bilateral ground-glass airspace opacity. Underlying chronic bibasilar scarring or fibrosis. Mild dependent bronchiectasis. No pleural effusion or pneumothorax. Upper Abdomen: No acute abnormality. Nonobstructive left nephrolithiasis. Musculoskeletal: No chest wall abnormality. No acute or significant osseous findings. Review of the MIP images confirms the above findings. IMPRESSION: 1. Negative examination for acute pulmonary embolism. 2. Suspect residua of chronic embolus, for example subtle calcification within the right lower lobar segmental vessels. Enlargement of the main pulmonary artery to 4.7 cm. 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 3. Extensive irregular, predominantly peripheral bilateral ground-glass airspace opacity, findings consistent with multifocal infection, particularly COVID-19 if suspected. 4. Underlying chronic bibasilar scarring or fibrosis and bronchiectasis. 5. Coronary artery disease. 6. Aortic Atherosclerosis (ICD10-I70.0). 7. Nonobstructive left nephrolithiasis. Electronically Signed   By: Lauralyn Primes M.D.   On: 08/28/2019 16:59   DG Chest Port 1 View  Result Date: 08/26/2019 CLINICAL DATA:  Fever and shortness of breath EXAM: PORTABLE CHEST 1 VIEW COMPARISON:  10/21/2014 FINDINGS: Patchy bilateral pulmonary infiltrate greater on the right. Generous heart size that is stable. No effusion or pneumothorax IMPRESSION: Patchy bilateral pneumonia. Electronically Signed   By: Marnee Spring M.D.   On: 08/26/2019 04:33        Discharge Exam: Vitals:   08/29/19 2230 08/30/19 0459  BP:  123/62  Pulse:  86  Resp: 20 (!)  24  Temp:  98.3 F (36.8 C)  SpO2:  93%   Vitals:   08/29/19 2111 08/29/19 2214 08/29/19 2230 08/30/19 0459  BP: 122/71   123/62  Pulse: (!) 101 86  86  Resp: (!) 24 (!) 28 20 (!) 24  Temp:  99.8 F (37.7 C)   98.3 F (36.8 C)  TempSrc: Oral   Oral  SpO2: 92% 93%  93%  Weight:      Height:        General: Pt is alert, awake, not in acute distress Cardiovascular: RRR, S1/S2 +, no rubs, no gallops Respiratory: bibasilar rales. No wheeze Abdominal: Soft, NT, ND, bowel sounds + Extremities: no edema, no cyanosis   The results of significant diagnostics from this hospitalization (including imaging, microbiology, ancillary and laboratory) are listed below for reference.    Significant Diagnostic Studies: CT ANGIO CHEST PE W OR WO CONTRAST  Result Date: 08/28/2019 CLINICAL DATA:  Hypoxemia, elevated D-dimer EXAM: CT ANGIOGRAPHY CHEST WITH CONTRAST TECHNIQUE: Multidetector CT imaging of the chest was performed using the standard protocol during bolus administration of intravenous contrast. Multiplanar CT image reconstructions and MIPs were obtained to evaluate the vascular anatomy. CONTRAST:  OMNIPAQUE IOHEXOL 350 MG/ML SOLN COMPARISON:  Chest radiograph, 08/26/2019, CT chest angiogram, 10/21/2014 FINDINGS: Cardiovascular: Satisfactory opacification of the pulmonary arteries to the segmental level. No evidence of acute pulmonary embolism. Suspect residua of chronic embolus, for example subtle calcification within the right lower lobar segmental vessels (series 9, image 71). Normal heart size. Left coronary artery calcifications. No pericardial effusion. Enlargement of the main pulmonary artery to 4.7 cm. Mediastinum/Nodes: Unchanged prominent mediastinal lymph nodes. Thyroid gland, trachea, and esophagus demonstrate no significant findings. Lungs/Pleura: Extensive irregular bilateral ground-glass airspace opacity. Underlying chronic bibasilar scarring or fibrosis. Mild dependent bronchiectasis. No pleural effusion or pneumothorax. Upper Abdomen: No acute abnormality. Nonobstructive left nephrolithiasis. Musculoskeletal: No chest wall abnormality. No acute or significant osseous  findings. Review of the MIP images confirms the above findings. IMPRESSION: 1. Negative examination for acute pulmonary embolism. 2. Suspect residua of chronic embolus, for example subtle calcification within the right lower lobar segmental vessels. Enlargement of the main pulmonary artery to 4.7 cm. 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 3. Extensive irregular, predominantly peripheral bilateral ground-glass airspace opacity, findings consistent with multifocal infection, particularly COVID-19 if suspected. 4. Underlying chronic bibasilar scarring or fibrosis and bronchiectasis. 5. Coronary artery disease. 6. Aortic Atherosclerosis (ICD10-I70.0). 7. Nonobstructive left nephrolithiasis. Electronically Signed   By: Lauralyn Primes M.D.   On: 08/28/2019 16:59   DG Chest Port 1 View  Result Date: 08/26/2019 CLINICAL DATA:  Fever and shortness of breath EXAM: PORTABLE CHEST 1 VIEW COMPARISON:  10/21/2014 FINDINGS: Patchy bilateral pulmonary infiltrate greater on the right. Generous heart size that is stable. No effusion or pneumothorax IMPRESSION: Patchy bilateral pneumonia. Electronically Signed   By: Marnee Spring M.D.   On: 08/26/2019 04:33     Microbiology: Recent Results (from the past 240 hour(s))  Urine culture     Status: Abnormal   Collection Time: 08/26/19  4:04 AM   Specimen: In/Out Cath Urine  Result Value Ref Range Status   Specimen Description   Final    IN/OUT CATH URINE Performed at [facility], [address] [place], [place] [address]    Special Requests   Final    NONE Performed at [facility], [address], [place], [place] [address]    Culture (A)  Final    <  10,000 COLONIES/mL INSIGNIFICANT GROWTH Performed at [facility], [address] [place], [place] [address]    Report Status 08/27/2019 FINAL  Final  Blood Culture (routine x 2)     Status: None (Preliminary result)   Collection Time: 08/26/19  4:50 AM   Specimen:  BLOOD RIGHT ARM  Result Value Ref Range Status   Specimen Description BLOOD RIGHT ARM  Final   Special Requests   Final    BOTTLES DRAWN AEROBIC AND ANAEROBIC Blood Culture adequate volume   Culture   Final    NO GROWTH 3 DAYS Performed at [facility], [address] [place], [place] [address]    Report Status PENDING  Incomplete  Blood Culture (routine x 2)     Status: None (Preliminary result)   Collection Time: 08/26/19  4:55 AM   Specimen: BLOOD RIGHT HAND  Result Value Ref Range Status   Specimen Description BLOOD RIGHT HAND  Final   Special Requests   Final    BOTTLES DRAWN AEROBIC AND ANAEROBIC Blood Culture adequate volume   Culture   Final    NO GROWTH 3 DAYS Performed at [facility], [address] [place], [place] [address]    Report Status PENDING  Incomplete  SARS CORONAVIRUS 2 (Gertrude Bucks 6-24 HRS) Nasopharyngeal Nasopharyngeal Swab     Status: Abnormal   Collection Time: 08/26/19  5:03 AM   Specimen: Nasopharyngeal Swab  Result Value Ref Range Status   SARS Coronavirus 2 POSITIVE (A) NEGATIVE Final    Comment: emailed L. Berdik RN 14:15 08/26/19 ([email or web address]) (NOTE) SARS-CoV-2 target nucleic acids are DETECTED. The SARS-CoV-2 RNA is generally detectable in upper and lower respiratory specimens during the acute phase of infection. Positive results are indicative of the presence of SARS-CoV-2 RNA. Clinical correlation with patient history and other diagnostic information is  necessary to determine patient infection status. Positive results do not rule out bacterial infection or co-infection with other viruses.  The expected result is Negative. Fact Sheet for Patients: HairSlick.no Fact Sheet for Healthcare Providers: quierodirigir.com This test is not yet approved or cleared by the [place] FDA and  has been authorized for detection and/or diagnosis of SARS-CoV-2 by FDA under an Emergency Use Authorization (EUA).  This EUA will remain  in effect (meaning this test can be used) for the duration of the COVID-19 declaration un der Section 564(b)(1) of the Act, 21 U.S.C. section 360bbb-3(b)(1), unless the authorization is terminated or revoked sooner. Performed at [facility], [address], [place], [place] [address]   Respiratory Panel by RT PCR (Flu A&B, Covid) - Nasopharyngeal Swab     Status: Abnormal   Collection Time: 08/26/19  9:15 AM   Specimen: Nasopharyngeal Swab  Result Value Ref Range Status   SARS Coronavirus 2 by RT PCR POSITIVE (A) NEGATIVE Final    Comment: RESULT CALLED TO, READ BACK BY AND VERIFIED WITH: FARMER,E. AT 1050 ON 08/26/2019 BY BAUGHAM,M. (NOTE) SARS-CoV-2 target nucleic acids are DETECTED. SARS-CoV-2 RNA is generally detectable in upper respiratory specimens  during the acute phase of infection. Positive results are indicative of the presence of the identified virus, but do not rule out bacterial infection or co-infection with other pathogens not detected by the test. Clinical correlation with patient history and other diagnostic information is necessary to determine patient infection status. The expected result is Negative. Fact Sheet for Patients:  https://www.moore.com/ Fact Sheet for Healthcare Providers: https://www.young.biz/ This test is not yet approved or cleared by  the [place] FDA and  has been authorized for detection and/or diagnosis of SARS-CoV-2 by FDA under an Emergency Use Authorization (EUA).  This EUA will remain in effect (meaning this test can  be used) for the duration of  the COVID-19 declaration under Section 564(b)(1) of the Act, 21 U.S.C. section 360bbb-3(b)(1), unless the authorization is terminated or revoked sooner.    Influenza A by PCR NEGATIVE NEGATIVE Final   Influenza B by PCR NEGATIVE NEGATIVE Final    Comment: (NOTE) The Xpert Xpress SARS-CoV-2/FLU/RSV assay is intended as an  aid in  the diagnosis of influenza from Nasopharyngeal swab specimens and  should not be used as a sole basis for treatment. Nasal washings and  aspirates are unacceptable for Xpert Xpress SARS-CoV-2/FLU/RSV  testing. Fact Sheet for Patients: https://www.moore.com/https://www.fda.gov/media/142436/download Fact Sheet for Healthcare Providers: https://www.young.biz/https://www.fda.gov/media/142435/download This test is not yet approved or cleared by the [place] FDA and  has been authorized for detection and/or diagnosis of SARS-CoV-2 by  FDA under an Emergency Use Authorization (EUA). This EUA will remain  in effect (meaning this test can be used) for the duration of the  Covid-19 declaration under Section 564(b)(1) of the Act, 21  U.S.C. section 360bbb-3(b)(1), unless the authorization is  terminated or revoked. Performed at [facility], [address] [place], [place] [id number]      Labs: Basic Metabolic Panel: Recent Labs  Lab 08/26/19 0446 08/27/19 0405 08/28/19 0746 08/29/19 0730 08/30/19 0816  NA 135 137 137 143 140  K 3.3* 4.1 3.7 4.1 4.2  CL 101 105 105 111 107  CO2 21* 20* 20* 22 24  GLUCOSE 118* 170* 147* 135* 128*  BUN 17 20 22 19 19   CREATININE 1.07 0.75 0.86 0.82 0.86  CALCIUM 8.7* 8.5* 8.6* 8.6* 8.7*  MG  --  2.3 2.4 2.3  --   PHOS  --  2.9 2.7 3.4  --    Liver Function Tests: Recent Labs  Lab 08/26/19 0446 08/27/19 0405 08/28/19 0746 08/29/19 0730 08/30/19 0816  AST 47* 40 40 55* 49*  ALT 26 26 29  42 68*  ALKPHOS 63 59 67 62 75  BILITOT 2.1* 0.9 0.9 1.1 1.3*  PROT 8.4* 7.8 8.8* 7.0 8.1  ALBUMIN 3.4* 2.9* 3.4* 2.7* 2.9*   No results for input(s): LIPASE, AMYLASE in the last 168 hours. No results for input(s): AMMONIA in the last 168 hours. CBC: Recent Labs  Lab 08/26/19 0446 08/27/19 0405 08/28/19 0746 08/29/19 0730 08/30/19 0816  WBC 16.8* 7.1 33.0* 30.3* 32.7*  NEUTROABS 13.5* 5.8 29.2* 26.7* 26.9*  HGB 10.4* 9.7* 11.9* 9.2* 10.6*  HCT 31.3* 30.6* 37.0* 27.9* 33.0*  MCV  101.6* 103.4* 104.8* 102.2* 102.2*  PLT 461* 499* 592* 633* 619*   Cardiac Enzymes: No results for input(s): CKTOTAL, CKMB, CKMBINDEX, TROPONINI in the last 168 hours. BNP: Invalid input(s): POCBNP CBG: Recent Labs  Lab 08/29/19 1120 08/29/19 1556 08/29/19 2108 08/30/19 0736 08/30/19 1059  GLUCAP 160* 145* 127* 125* 170*    Time coordinating discharge:  36 minutes  Signed:  Catarina Hartshornavid Towana Stenglein, DO [facility] Pager: [phone] 08/30/2019, 12:52 PM

## 2019-08-30 LAB — CBC WITH DIFFERENTIAL/PLATELET
Abs Immature Granulocytes: 1.45 10*3/uL — ABNORMAL HIGH (ref 0.00–0.07)
Basophils Absolute: 0.1 10*3/uL (ref 0.0–0.1)
Basophils Relative: 0 %
Eosinophils Absolute: 0 10*3/uL (ref 0.0–0.5)
Eosinophils Relative: 0 %
HCT: 33 % — ABNORMAL LOW (ref 39.0–52.0)
Hemoglobin: 10.6 g/dL — ABNORMAL LOW (ref 13.0–17.0)
Immature Granulocytes: 4 %
Lymphocytes Relative: 5 %
Lymphs Abs: 1.5 10*3/uL (ref 0.7–4.0)
MCH: 32.8 pg (ref 26.0–34.0)
MCHC: 32.1 g/dL (ref 30.0–36.0)
MCV: 102.2 fL — ABNORMAL HIGH (ref 80.0–100.0)
Monocytes Absolute: 2.8 10*3/uL — ABNORMAL HIGH (ref 0.1–1.0)
Monocytes Relative: 9 %
Neutro Abs: 26.9 10*3/uL — ABNORMAL HIGH (ref 1.7–7.7)
Neutrophils Relative %: 82 %
Platelets: 619 10*3/uL — ABNORMAL HIGH (ref 150–400)
RBC: 3.23 MIL/uL — ABNORMAL LOW (ref 4.22–5.81)
RDW: 16.5 % — ABNORMAL HIGH (ref 11.5–15.5)
WBC: 32.7 10*3/uL — ABNORMAL HIGH (ref 4.0–10.5)
nRBC: 0.9 % — ABNORMAL HIGH (ref 0.0–0.2)

## 2019-08-30 LAB — GLUCOSE, CAPILLARY
Glucose-Capillary: 125 mg/dL — ABNORMAL HIGH (ref 70–99)
Glucose-Capillary: 170 mg/dL — ABNORMAL HIGH (ref 70–99)
Glucose-Capillary: 209 mg/dL — ABNORMAL HIGH (ref 70–99)

## 2019-08-30 LAB — COMPREHENSIVE METABOLIC PANEL
ALT: 68 U/L — ABNORMAL HIGH (ref 0–44)
AST: 49 U/L — ABNORMAL HIGH (ref 15–41)
Albumin: 2.9 g/dL — ABNORMAL LOW (ref 3.5–5.0)
Alkaline Phosphatase: 75 U/L (ref 38–126)
Anion gap: 9 (ref 5–15)
BUN: 19 mg/dL (ref 8–23)
CO2: 24 mmol/L (ref 22–32)
Calcium: 8.7 mg/dL — ABNORMAL LOW (ref 8.9–10.3)
Chloride: 107 mmol/L (ref 98–111)
Creatinine, Ser: 0.86 mg/dL (ref 0.61–1.24)
GFR calc Af Amer: >60 mL/min (ref >60)
GFR calc non Af Amer: >60 mL/min (ref >60)
Glucose, Bld: 128 mg/dL — ABNORMAL HIGH (ref 70–99)
Potassium: 4.2 mmol/L (ref 3.5–5.1)
Sodium: 140 mmol/L (ref 135–145)
Total Bilirubin: 1.3 mg/dL — ABNORMAL HIGH (ref 0.3–1.2)
Total Protein: 8.1 g/dL (ref 6.5–8.1)

## 2019-08-30 LAB — D-DIMER, QUANTITATIVE: D-Dimer, Quant: 3.44 ug/mL-FEU — ABNORMAL HIGH (ref 0.00–0.50)

## 2019-08-30 LAB — C-REACTIVE PROTEIN: CRP: 3.7 mg/dL — ABNORMAL HIGH (ref <1.0)

## 2019-08-30 LAB — FERRITIN: Ferritin: 608 ng/mL — ABNORMAL HIGH (ref 24–336)

## 2019-08-30 MED ORDER — CEFDINIR 300 MG PO CAPS: 300.0000 mg | ORAL_CAPSULE | Freq: Two times a day (BID) | ORAL | 0 refills | Status: DC

## 2019-08-30 MED ORDER — AZITHROMYCIN 250 MG PO TABS: 500.0000 mg | ORAL_TABLET | Freq: Every day | ORAL | Status: DC

## 2019-08-30 MED ORDER — PREDNISONE 20 MG PO TABS: 60.0000 mg | ORAL_TABLET | Freq: Two times a day (BID) | ORAL | 0 refills | Status: DC

## 2019-08-30 MED ORDER — AZITHROMYCIN 500 MG PO TABS: 500.0000 mg | ORAL_TABLET | Freq: Every day | ORAL | 0 refills | Status: DC

## 2019-08-30 MED ORDER — CEFDINIR 300 MG PO CAPS: 300.0000 mg | ORAL_CAPSULE | Freq: Two times a day (BID) | ORAL | Status: DC

## 2019-08-30 MED ORDER — SODIUM CHLORIDE 0.9 % IV SOLN
100.0000 mg | Freq: Once | INTRAVENOUS | Status: AC
  Administered 2019-08-30: 100 mg via INTRAVENOUS
  Filled 2019-08-30: qty 20

## 2019-08-30 MED ORDER — PREDNISONE 20 MG PO TABS
60.0000 mg | ORAL_TABLET | Freq: Two times a day (BID) | ORAL | Status: DC
  Administered 2019-08-30: 17:00:00 60 mg via ORAL
  Filled 2019-08-30: qty 3

## 2019-08-30 NOTE — TOC Transition Note (Signed)
Transition of Care [facility]) - CM/SW Discharge Note   Patient Details  Name: [name] MRN: [id number] Date of Birth: 1945-11-13  Transition of Care [facility]) CM/SW Contact:  Larwance Rote, LCSW Phone Number: 08/30/2019, 4:05 PM  Clinical Narrative:   Patient schedule to discharge today w/Oxygen.  Patient and family notified.  [facility] to contact family.  Equipment will be delivered to pt's AP hospital bedside.   Final next level of care: Home/Self Care Barriers to Discharge: Barriers Resolved  Patient Goals and CMS Choice   CMS Medicare.gov Compare Post Acute Care list provided to:: Patient Choice offered to / list presented to : Patient  Discharge Placement   Discharge Plan and Services              DME Arranged: Oxygen DME Agency: [facility] Date DME Agency Contacted: 08/30/19 Time DME Agency Contacted: 1603 Representative spoke with at DME Agency: [place] [phone]     Social Determinants of Health (SDOH) Interventions   Readmission Risk Interventions No flowsheet data found.

## 2019-08-30 NOTE — Progress Notes (Addendum)
Received call from [facility], patient needs home oxygen for discharge; Spouse called to verify home address [address] [place], [place] [address] [employer] called; clinical information given telephonically; spoke to Arlys John with [employer] / Driver - he will deliver oxygen to the hospital.  Message sent to [facility] to fax clinical information - demographic, H&P, home 02 orders, qualifying 02 sats and DC summary to [phone]. Once they receive the information , they will process the order and deliver the oxygen. Jiles Crocker [address] [facility] Advanced Care Supervisor [phone]  3:50pm - Received call from [facility]; she was called also to arrange for home oxygen and she set it up with [employer]. A portable oxygen tank will be delivered to the hospital around 6pm. [facility] called order for oxygen cancelled. Abelino Derrick RN,MHA,BSN

## 2019-08-30 NOTE — Plan of Care (Signed)

## 2019-08-30 NOTE — Progress Notes (Signed)
SATURATION QUALIFICATIONS: (This note is used to comply with regulatory documentation for home oxygen)  Patient Saturations on Room Air at Rest = 90  Patient Saturations on Room Air while Ambulating = 82  Patient Saturations on 3 Liters of oxygen while Ambulating = 92-93%  Please briefly explain why patient needs home oxygen: To maintain 02 sat at 90% or above during ambulation.  DTat

## 2019-08-31 LAB — CULTURE, BLOOD (ROUTINE X 2)
Culture: NO GROWTH
Culture: NO GROWTH
Special Requests: ADEQUATE
Special Requests: ADEQUATE

## 2020-03-15 ENCOUNTER — Encounter (HOSPITAL_COMMUNITY): Payer: Self-pay | Admitting: *Deleted

## 2020-03-15 ENCOUNTER — Emergency Department (HOSPITAL_COMMUNITY): Payer: Medicare HMO

## 2020-03-15 ENCOUNTER — Emergency Department (HOSPITAL_COMMUNITY)
Admission: EM | Admit: 2020-03-15 | Discharge: 2020-03-15 | Disposition: A | Discharge status: Home / Self Care | Payer: Medicare HMO | Attending: Emergency Medicine | Admitting: Emergency Medicine

## 2020-03-15 ENCOUNTER — Other Ambulatory Visit: Payer: Self-pay

## 2020-03-15 DIAGNOSIS — Z87891 Personal history of nicotine dependence: Secondary | ICD-10-CM | POA: Insufficient documentation

## 2020-03-15 DIAGNOSIS — R0789 Other chest pain: Secondary | ICD-10-CM | POA: Diagnosis not present

## 2020-03-15 DIAGNOSIS — Z7901 Long term (current) use of anticoagulants: Secondary | ICD-10-CM | POA: Diagnosis not present

## 2020-03-15 DIAGNOSIS — R079 Chest pain, unspecified: Secondary | ICD-10-CM | POA: Diagnosis present

## 2020-03-15 MED ORDER — HYDROCODONE-ACETAMINOPHEN 5-325 MG PO TABS: 1.0000 | ORAL_TABLET | Freq: Four times a day (QID) | ORAL | 0 refills | Status: DC | PRN

## 2020-03-15 MED ORDER — HYDROCODONE-ACETAMINOPHEN 5-325 MG PO TABS
1.0000 | ORAL_TABLET | Freq: Once | ORAL | Status: AC
  Administered 2020-03-15: 1 via ORAL
  Filled 2020-03-15: qty 1
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
## 2020-03-15 NOTE — Discharge Instructions (Addendum)
Follow-up with your family doctor next week if any problems 

## 2020-03-15 NOTE — ED Notes (Signed)
Went in to wake pt for breakfast. Pt looked at the breakfast tray and laid back down and covered himself up. Tray is on bedside table.

## 2021-02-02 ENCOUNTER — Other Ambulatory Visit: Payer: Self-pay

## 2021-02-02 ENCOUNTER — Encounter (HOSPITAL_COMMUNITY): Payer: Self-pay | Admitting: Emergency Medicine

## 2021-02-02 ENCOUNTER — Emergency Department (HOSPITAL_COMMUNITY)
Admission: EM | Admit: 2021-02-02 | Discharge: 2021-02-02 | Disposition: A | Discharge status: Home / Self Care | Payer: Medicare HMO | Attending: Emergency Medicine | Admitting: Emergency Medicine

## 2021-02-02 ENCOUNTER — Emergency Department (HOSPITAL_COMMUNITY): Payer: Medicare HMO

## 2021-02-02 DIAGNOSIS — Z20822 Contact with and (suspected) exposure to covid-19: Secondary | ICD-10-CM | POA: Diagnosis not present

## 2021-02-02 DIAGNOSIS — Z87891 Personal history of nicotine dependence: Secondary | ICD-10-CM | POA: Diagnosis not present

## 2021-02-02 DIAGNOSIS — Z7901 Long term (current) use of anticoagulants: Secondary | ICD-10-CM | POA: Diagnosis not present

## 2021-02-02 DIAGNOSIS — R519 Headache, unspecified: Secondary | ICD-10-CM

## 2021-02-02 DIAGNOSIS — E876 Hypokalemia: Secondary | ICD-10-CM

## 2021-02-02 DIAGNOSIS — Z8616 Personal history of COVID-19: Secondary | ICD-10-CM | POA: Diagnosis not present

## 2021-02-02 LAB — CBC WITH DIFFERENTIAL/PLATELET
Abs Immature Granulocytes: 0.33 10*3/uL — ABNORMAL HIGH (ref 0.00–0.07)
Basophils Absolute: 0.2 10*3/uL — ABNORMAL HIGH (ref 0.0–0.1)
Basophils Relative: 1 %
Eosinophils Absolute: 0.4 10*3/uL (ref 0.0–0.5)
Eosinophils Relative: 3 %
HCT: 30.3 % — ABNORMAL LOW (ref 39.0–52.0)
Hemoglobin: 10.3 g/dL — ABNORMAL LOW (ref 13.0–17.0)
Immature Granulocytes: 3 %
Lymphocytes Relative: 13 %
Lymphs Abs: 1.8 10*3/uL (ref 0.7–4.0)
MCH: 36.3 pg — ABNORMAL HIGH (ref 26.0–34.0)
MCHC: 34 g/dL (ref 30.0–36.0)
MCV: 106.7 fL — ABNORMAL HIGH (ref 80.0–100.0)
Monocytes Absolute: 1.3 10*3/uL — ABNORMAL HIGH (ref 0.1–1.0)
Monocytes Relative: 10 %
Neutro Abs: 9.4 10*3/uL — ABNORMAL HIGH (ref 1.7–7.7)
Neutrophils Relative %: 70 %
Platelets: 504 10*3/uL — ABNORMAL HIGH (ref 150–400)
RBC: 2.84 MIL/uL — ABNORMAL LOW (ref 4.22–5.81)
RDW: 16.3 % — ABNORMAL HIGH (ref 11.5–15.5)
WBC: 13.4 10*3/uL — ABNORMAL HIGH (ref 4.0–10.5)
nRBC: 0.2 % (ref 0.0–0.2)

## 2021-02-02 LAB — BASIC METABOLIC PANEL
Anion gap: 8 (ref 5–15)
BUN: 13 mg/dL (ref 8–23)
CO2: 23 mmol/L (ref 22–32)
Calcium: 8.5 mg/dL — ABNORMAL LOW (ref 8.9–10.3)
Chloride: 111 mmol/L (ref 98–111)
Creatinine, Ser: 1.02 mg/dL (ref 0.61–1.24)
GFR, Estimated: >60 mL/min (ref >60)
Glucose, Bld: 103 mg/dL — ABNORMAL HIGH (ref 70–99)
Potassium: 3.2 mmol/L — ABNORMAL LOW (ref 3.5–5.1)
Sodium: 142 mmol/L (ref 135–145)

## 2021-02-02 LAB — URINALYSIS, ROUTINE W REFLEX MICROSCOPIC
Bacteria, UA: NONE SEEN
Bilirubin Urine: NEGATIVE
Glucose, UA: NEGATIVE mg/dL
Hgb urine dipstick: NEGATIVE
Ketones, ur: NEGATIVE mg/dL
Leukocytes,Ua: NEGATIVE
Nitrite: NEGATIVE
Protein, ur: 30 mg/dL — AB
Specific Gravity, Urine: 1.014 (ref 1.005–1.030)
pH: 5 (ref 5.0–8.0)

## 2021-02-02 LAB — RESP PANEL BY RT-PCR (FLU A&B, COVID) ARPGX2
Influenza A by PCR: NEGATIVE
Influenza B by PCR: NEGATIVE
SARS Coronavirus 2 by RT PCR: NEGATIVE

## 2021-02-02 MED ORDER — SODIUM CHLORIDE 0.9 % IV BOLUS
500.0000 mL | Freq: Once | INTRAVENOUS | Status: AC
  Administered 2021-02-02: 500 mL via INTRAVENOUS

## 2021-02-02 MED ORDER — ACETAMINOPHEN 325 MG PO TABS
650.0000 mg | ORAL_TABLET | Freq: Once | ORAL | Status: AC
  Administered 2021-02-02: 650 mg via ORAL
  Filled 2021-02-02: qty 2

## 2021-02-02 MED ORDER — POTASSIUM CHLORIDE ER 10 MEQ PO TBCR: 10.0000 meq | EXTENDED_RELEASE_TABLET | Freq: Two times a day (BID) | ORAL | 0 refills | Status: DC

## 2021-02-02 MED ORDER — DEXAMETHASONE SODIUM PHOSPHATE 10 MG/ML IJ SOLN
6.0000 mg | Freq: Once | INTRAMUSCULAR | Status: AC
  Administered 2021-02-02: 6 mg via INTRAVENOUS
  Filled 2021-02-02: qty 1

## 2021-02-02 NOTE — ED Triage Notes (Signed)
C/o headache for the past 2 days

## 2021-02-02 NOTE — ED Provider Notes (Signed)
[name] Provider Note   CSN: [id number] Arrival date & time: 02/02/21  1111     History Chief Complaint  Patient presents with   Headache    [name] StackLonnie C [name] is a 75 y.o. male.  The history is provided by the patient.  Headache Pain location:  Occipital Quality:  Unable to specify Radiates to:  Does not radiate Associated symptoms: no abdominal pain, no back pain, no cough, no diarrhea, no dizziness, no fever, no nausea, no neck pain, no neck stiffness, no numbness, no vomiting and no weakness       [name] is a 75 y.o. male, with a history of PAD, DVT, presenting to the ED with headache for the last two days.  Patient states his headache began while he was outside watering his garden.  He has tried [name] powders which will temporarily relieve the pain. Pain is to the occipital region, constant, "feels like a headache," nonradiating.  He also notes an area of tenderness with a raised area on the skin to the lower occipital region of the scalp. Denies anticoagulation.  Patient states he takes no prescribed medications at all. Denies fever/chills, N/V/D, neck pain/stiffness, cough, shortness of breath, chest pain, back pain, numbness, weakness, speech deficit, facial deficit, falls/trauma, dizziness, vision changes, or any other complaints.   Past Medical History:  Diagnosis Date   Allergy    DVT (deep venous thrombosis) (HCC)    Peripheral artery disease (HCC)    Status post ablation of incompetent vein using laser    Left Leg   Varicose veins of both lower extremities     Patient Active Problem List   Diagnosis Date Noted   Hypokalemia    CAP (community acquired pneumonia) 08/26/2019   PNA (pneumonia) 08/26/2019   Acute respiratory disease due to COVID-19 virus 08/26/2019   Pneumonia due to COVID-19 virus 08/26/2019   Acute Respiratory failure with hypoxia (HCC) 08/26/2019   H/o VTE (venous thromboembolism)/ H/o DVT and PE  08/26/2019   H/o Pulmonary HTN (HCC) 08/26/2019   Swelling of limb 09/11/2016   Lower limb ulcer, ankle, right, with fat layer exposed (HCC) 09/11/2016   Varicose veins of lower extremities with ulcer (HCC) 09/11/2016    Past Surgical History:  Procedure Laterality Date   CHOLECYSTECTOMY     FOOT SURGERY Right    HAND AMPUTATION Left    IVC FILTER PLACEMENT ([facility] HX)     SPLENECTOMY, TOTAL         History reviewed. No pertinent family history.  Social History   Tobacco Use   Smoking status: Former    Pack years: 0.00   Smokeless tobacco: Never  Vaping Use   Vaping Use: Never used  Substance Use Topics   Alcohol use: No   Drug use: No    Home Medications Prior to Admission medications   Medication Sig Start Date End Date Taking? Authorizing Provider  apixaban (ELIQUIS) 5 MG TABS tablet Take 1 tablet (5 mg total) by mouth 2 (two) times daily. 08/29/19   Catarina Hartshornat, David, MD  ascorbic acid (VITAMIN C) 500 MG tablet Take 1 tablet (500 mg total) by mouth daily. 08/30/19   Catarina Hartshornat, David, MD  Cyanocobalamin (B-12) 1000 MCG CAPS Take 1 capsule by mouth daily.  02/06/10   [provider]  Multiple Vitamin (MULTI-VITAMINS) TABS Take 1 tablet by mouth daily.     [provider]  zinc sulfate 220 (50 Zn) MG capsule Take 1 capsule (220  mg total) by mouth daily. 08/30/19   Catarina Hartshorn, MD    Allergies    Aspirin  Review of Systems   Review of Systems  Constitutional:  Negative for chills, diaphoresis and fever.  Eyes:  Negative for visual disturbance.  Respiratory:  Negative for cough and shortness of breath.   Cardiovascular:  Negative for chest pain.  Gastrointestinal:  Negative for abdominal pain, diarrhea, nausea and vomiting.  Genitourinary:  Negative for dysuria.  Musculoskeletal:  Negative for back pain, neck pain and neck stiffness.  Neurological:  Positive for headaches. Negative for dizziness, syncope, facial asymmetry, speech difficulty, weakness, light-headedness  and numbness.  All other systems reviewed and are negative.  Physical Exam Updated Vital Signs BP 140/77   Pulse 92   Temp 98.4 F (36.9 C) (Oral)   Resp 18   Ht 6\' 4"  (1.93 m)   Wt 98.4 kg   SpO2 95%   BMI 26.41 kg/m   Physical Exam Vitals and nursing note reviewed.  Constitutional:      General: He is not in acute distress.    Appearance: He is well-developed. He is not diaphoretic.  HENT:     Head: Normocephalic and atraumatic.     Comments: Small area of tenderness to the skin of the scalp in the central occipital region without noted swelling, fluctuance, erythema, exudate.  No other areas of tenderness noted.    Right Ear: Tympanic membrane, ear canal and external ear normal.     Left Ear: Tympanic membrane, ear canal and external ear normal.     Mouth/Throat:     Mouth: Mucous membranes are moist.     Pharynx: Oropharynx is clear.  Eyes:     Conjunctiva/sclera: Conjunctivae normal.  Cardiovascular:     Rate and Rhythm: Normal rate and regular rhythm.     Pulses: Normal pulses.          Radial pulses are 2+ on the right side and 2+ on the left side.       Posterior tibial pulses are 2+ on the right side and 2+ on the left side.     Heart sounds: Normal heart sounds.     Comments: Tactile temperature in the extremities appropriate and equal bilaterally. Pulmonary:     Effort: Pulmonary effort is normal. No respiratory distress.     Breath sounds: Normal breath sounds.  Abdominal:     Palpations: Abdomen is soft.     Tenderness: There is no abdominal tenderness. There is no guarding.  Musculoskeletal:     Cervical back: Normal range of motion and neck supple. No tenderness.     Right lower leg: No edema.     Left lower leg: No edema.  Lymphadenopathy:     Cervical: No cervical adenopathy.  Skin:    General: Skin is warm and dry.  Neurological:     Mental Status: He is alert and oriented to person, place, and time.     Comments: No noted acute cognitive  deficit. Sensation grossly intact to light touch in the extremities.   Grip strong on right. Left hand with previous amputation.  Strength 5/5 in all extremities.  No gait disturbance.  Coordination intact.  Cranial nerves III-XII grossly intact.  Handles oral secretions without noted difficulty.  No noted phonation or speech deficit. No facial droop.   Psychiatric:        Mood and Affect: Mood and affect normal.        Speech: Speech normal.  Behavior: Behavior normal.    ED Results / Procedures / Treatments   Labs (all labs ordered are listed, but only abnormal results are displayed) Labs Reviewed  BASIC METABOLIC PANEL - Abnormal; Notable for the following components:      Result Value   Potassium 3.2 (*)    Glucose, Bld 103 (*)    Calcium 8.5 (*)    All other components within normal limits  CBC WITH DIFFERENTIAL/PLATELET - Abnormal; Notable for the following components:   WBC 13.4 (*)    RBC 2.84 (*)    Hemoglobin 10.3 (*)    HCT 30.3 (*)    MCV 106.7 (*)    MCH 36.3 (*)    RDW 16.3 (*)    Platelets 504 (*)    Neutro Abs 9.4 (*)    Monocytes Absolute 1.3 (*)    Basophils Absolute 0.2 (*)    Abs Immature Granulocytes 0.33 (*)    All other components within normal limits  RESP PANEL BY RT-PCR (FLU A&B, COVID) ARPGX2  URINALYSIS, ROUTINE W REFLEX MICROSCOPIC   Hemoglobin  Date Value Ref Range Status  02/02/2021 10.3 (L) 13.0 - 17.0 g/dL Final  51/09/5850 77.8 (L) 13.0 - 17.0 g/dL Final  24/23/5361 9.2 (L) 13.0 - 17.0 g/dL Final  44/31/5400 86.7 (L) 13.0 - 17.0 g/dL Final   HGB  Date Value Ref Range Status  07/21/2014 11.6 (L) 13.0 - 18.0 g/dL Final  61/95/0932 67.1 (L) 13.0 - 18.0 g/dL Final  24/58/0998 33.8 (L) 13.0 - 18.0 g/dL Final  25/12/3974 73.4 (L) 13.0 - 18.0 g/dL Final   WBC  Date Value Ref Range Status  02/02/2021 13.4 (H) 4.0 - 10.5 K/uL Final  08/30/2019 32.7 (H) 4.0 - 10.5 K/uL Final  08/29/2019 30.3 (H) 4.0 - 10.5 K/uL Final   08/28/2019 33.0 (H) 4.0 - 10.5 K/uL Final    Comment:    WHITE COUNT CONFIRMED ON SMEAR    EKG None  Radiology CT Head Wo Contrast  Result Date: 02/02/2021 CLINICAL DATA:  Right headache radiating to the right neck. EXAM: CT HEAD WITHOUT CONTRAST TECHNIQUE: Contiguous axial images were obtained from the base of the skull through the vertex without intravenous contrast. COMPARISON:  March 04, 2014 FINDINGS: Brain: No evidence of acute infarction, hemorrhage, hydrocephalus, extra-axial collection or mass lesion/mass effect. Mild bilateral periventricular white matter small vessel ischemic changes are identified. Vascular: No hyperdense vessel is identified. Skull: Normal. Negative for fracture or focal lesion. Sinuses/Orbits: No acute finding. Other: None. IMPRESSION: 1. No focal acute intracranial abnormality identified. 2. Mild bilateral periventricular white matter small vessel ischemic change. Electronically Signed   By: Sherian Rein M.D.   On: 02/02/2021 13:33    Procedures Procedures   Medications Ordered in ED Medications  acetaminophen (TYLENOL) tablet 650 mg (650 mg Oral Given 02/02/21 1224)  sodium chloride 0.9 % bolus 500 mL (0 mLs Intravenous Stopped 02/02/21 1426)  dexamethasone (DECADRON) injection 6 mg (6 mg Intravenous Given 02/02/21 1244)    ED Course  I have reviewed the triage vital signs and the nursing notes.  Pertinent labs & imaging results that were available during my care of the patient were reviewed by me and considered in my medical decision making (see chart for details).  Clinical Course as of 02/02/21 1518  Thu Feb 02, 2021  1345 Patient states he feels much better with resolution in his headache.  We further discussed the plan to perform CTA head and neck.  Patient agrees  to move forward with this plan. [SJ]  1349 Platelets(!): 504 Last recorded values around 600 last year. [SJ]  1442 Patient states he does not want to do the CTA head & neck.  [SJ]     Clinical Course User Index [SJ] Juliyah Mergen, Hillard Danker, PA-C   MDM Rules/Calculators/A&P                          Patient presents complaining of headache for the last couple days. No focal neurologic deficits noted. Patient is nontoxic appearing, afebrile, not tachycardic, not tachypneic, not hypotensive, maintains excellent SPO2 on room air, and is in no apparent distress.   I have reviewed the patient's chart to obtain more information.   I reviewed and interpreted the patient's labs and radiological studies. Mild leukocytosis nonspecific at this time.  Mild hypokalemia noted.  Patient states he does not want to take any medications for this.  CTA head and neck was discussed and initially agreed to by the patient.  Later in his course, he changed his mind on this imaging study.  I had a frank conversation with the patient and family member at the bedside.  I discussed the possibility for abnormalities within the head or neck that could be causing the patient's symptoms.  Patient acknowledges this and continues to decline additional imaging.  The patient was given instructions for home care as well as return precautions. Patient voices understanding of these instructions, accepts the plan, and is comfortable with discharge.  Findings and plan of care discussed with attending physician, Coralee Pesa, MD.    Vitals:   02/02/21 1119 02/02/21 1123 02/02/21 1458  BP: 140/77  (!) 142/77  Pulse: 92  72  Resp: 18  18  Temp: 98.4 F (36.9 C)  98.3 F (36.8 C)  TempSrc: Oral    SpO2: 95%  97%  Weight:  98.4 kg   Height:  6\' 4"  (1.93 m)      Final Clinical Impression(s) / ED Diagnoses Final diagnoses:  Occipital headache    Rx / DC Orders ED Discharge Orders     None        , PA-C 02/02/21 1518    [place], 04/04/21, DO 02/02/21 1519

## 2021-02-02 NOTE — ED Notes (Signed)
C/o pain in back of head. Denies any injury.

## 2021-02-02 NOTE — Discharge Instructions (Addendum)
Headache Your lab results are overall reassuring. Low potassium (hypokalemia): Your potassium was lower than normal. Please refer to the attached pages regarding changes in diet to increase your potassium. You have also been prescribed a course of potassium supplementation. Finish this course of medication and recommend having your potassium rechecked through a primary care provider.  For future headaches please try the following regimen: Take 400 mg of ibuprofen every 6 hours for the next 3 days. After this time, this medication may be used as needed for pain. Take this medication with food to avoid upset stomach. Acetaminophen: Should you continue to have additional pain while taking the ibuprofen, you may add in acetaminophen (generic for Tylenol) as needed. Your daily total maximum amount of acetaminophen from all sources should be limited to 4000mg /day for persons without liver problems, or 2000mg /day for those with liver problems.  Hydration: Have a goal of about a half liter of water every couple hours to stay well hydrated.   Sleep: Please be sure to get plenty of sleep with a goal of 8 hours per night. Having a regular bed time and bedtime routine can help with this.  Screens: Reduce the amount of time you are in front of screens.  Take about a 5-10-minute break every hour or every couple hours to give your eyes rest.  Do not use screens in dark rooms.  Glasses with a blue light filter may also help reduce eye fatigue.  Stress: Take steps to reduce stress as much as possible.   Follow up: Follow-up with your primary care provider on this issue.  May also need to follow-up with the neurologist for increased frequency of headaches.

## 2022-01-19 ENCOUNTER — Other Ambulatory Visit: Payer: Self-pay

## 2022-01-19 ENCOUNTER — Encounter (HOSPITAL_COMMUNITY): Payer: Self-pay | Admitting: Emergency Medicine

## 2022-01-19 ENCOUNTER — Emergency Department (HOSPITAL_COMMUNITY)
Admission: EM | Admit: 2022-01-19 | Discharge: 2022-01-19 | Disposition: A | Discharge status: Home / Self Care | Payer: Medicare HMO | Attending: Emergency Medicine | Admitting: Emergency Medicine

## 2022-01-19 DIAGNOSIS — Z7901 Long term (current) use of anticoagulants: Secondary | ICD-10-CM | POA: Insufficient documentation

## 2022-01-19 DIAGNOSIS — D75839 Thrombocytosis, unspecified: Secondary | ICD-10-CM | POA: Diagnosis not present

## 2022-01-19 DIAGNOSIS — R519 Headache, unspecified: Secondary | ICD-10-CM | POA: Diagnosis present

## 2022-01-19 DIAGNOSIS — G4485 Primary stabbing headache: Secondary | ICD-10-CM

## 2022-01-19 MED ORDER — ACETAMINOPHEN 500 MG PO TABS
1000.0000 mg | ORAL_TABLET | Freq: Once | ORAL | Status: AC
  Administered 2022-01-19: 1000 mg via ORAL
  Filled 2022-01-19: qty 2

## 2022-01-19 MED ORDER — METOCLOPRAMIDE HCL 5 MG/ML IJ SOLN
10.0000 mg | Freq: Once | INTRAMUSCULAR | Status: AC
  Administered 2022-01-19: 10 mg via INTRAMUSCULAR
  Filled 2022-01-19: qty 2

## 2022-01-19 MED ORDER — KETOROLAC TROMETHAMINE 30 MG/ML IJ SOLN
30.0000 mg | Freq: Once | INTRAMUSCULAR | Status: AC
  Administered 2022-01-19: 30 mg via INTRAMUSCULAR
  Filled 2022-01-19: qty 1

## 2022-01-19 MED ORDER — ACETAMINOPHEN 500 MG PO TABS: 500.0000 mg | ORAL_TABLET | Freq: Four times a day (QID) | ORAL | 0 refills | Status: AC | PRN

## 2022-01-19 NOTE — ED Provider Notes (Signed)
[facility] EMERGENCY DEPARTMENT Provider Note   CSN: [id number] Arrival date & time: 01/19/22  0174     History  Chief Complaint  Patient presents with   Headache    [name] is a 76 y.o. male.  HPI     76 year old patient comes in with chief complaint of headache.  He indicates that he started having headache over the vertex, 5 days ago.  The headache has been constant since then.  He has taken over-the-counter medications including Excedrin for migraine and has not had any relief.  The relief when it does occur has been transient.  He denies any associated vision change, dizziness, lightheadedness, focal weakness, focal numbness.  He had headaches like this few months back.  Our records indicate a visit for headache last year.  Patient indicates the headache appears to be similar.  He did not follow-up with PCP or neurologist for it.  Additionally, patient at that time had elevated platelet count.  He indicates that he has not followed up with anyone about it.  He saw his PCP just few days back, there was no blood work done at that time.  Currently, patient wants to go home.  He does not want any work-up in the ED if it can be done as an outpatient.  Home Medications Prior to Admission medications   Medication Sig Start Date End Date Taking? Authorizing Provider  acetaminophen (TYLENOL) 500 MG tablet Take 1 tablet (500 mg total) by mouth every 6 (six) hours as needed. 01/19/22  Yes Derwood Kaplan, MD  apixaban (ELIQUIS) 5 MG TABS tablet Take 1 tablet (5 mg total) by mouth 2 (two) times daily. 08/29/19   Catarina Hartshorn, MD  ascorbic acid (VITAMIN C) 500 MG tablet Take 1 tablet (500 mg total) by mouth daily. 08/30/19   Catarina Hartshorn, MD  Cyanocobalamin (B-12) 1000 MCG CAPS Take 1 capsule by mouth daily.  02/06/10   [provider]  Multiple Vitamin (MULTI-VITAMINS) TABS Take 1 tablet by mouth daily.     [provider]  potassium chloride (KLOR-CON) 10 MEQ tablet  Take 1 tablet (10 mEq total) by mouth 2 (two) times daily for 5 days. 02/02/21 02/07/21  Joy, Shawn C, PA-C  zinc sulfate 220 (50 Zn) MG capsule Take 1 capsule (220 mg total) by mouth daily. 08/30/19   Catarina Hartshorn, MD      Allergies    Patient has no active allergies.    Review of Systems   Review of Systems  Physical Exam Updated Vital Signs BP 136/65 (BP Location: Right Arm)   Pulse 68   Temp 97.7 F (36.5 C) (Oral)   Resp 16   Ht 6\' 4"  (1.93 m)   Wt 98.4 kg   SpO2 100%   BMI 26.41 kg/m  Physical Exam Vitals and nursing note reviewed.  Constitutional:      Appearance: He is well-developed.  HENT:     Head: Atraumatic.  Eyes:     General: No visual field deficit.    Comments: No nystagmus  Cardiovascular:     Rate and Rhythm: Normal rate.  Pulmonary:     Effort: Pulmonary effort is normal.  Musculoskeletal:     Cervical back: Neck supple.  Skin:    General: Skin is warm.  Neurological:     Mental Status: He is alert and oriented to person, place, and time.     GCS: GCS eye subscore is 4. GCS verbal subscore is 5. GCS motor  subscore is 6.     Cranial Nerves: No cranial nerve deficit, dysarthria or facial asymmetry.     Sensory: No sensory deficit.     Motor: No weakness.     Coordination: Coordination normal.    ED Results / Procedures / Treatments   Labs (all labs ordered are listed, but only abnormal results are displayed) Labs Reviewed - No data to display  EKG None  Radiology No results found.  Procedures Procedures    Medications Ordered in ED Medications  ketorolac (TORADOL) 30 MG/ML injection 30 mg (30 mg Intramuscular Given 01/19/22 1215)  metoCLOPramide (REGLAN) injection 10 mg (10 mg Intramuscular Given 01/19/22 1213)  acetaminophen (TYLENOL) tablet 1,000 mg (1,000 mg Oral Given 01/19/22 1213)    ED Course/ Medical Decision Making/ A&P                           Medical Decision Making Risk OTC drugs. Prescription drug  management.   76 year old male comes in with chief complaint of headache.  Constant moderately severe headache described as stabbing and burning type pain over the vertex.  There is no associated neurodeficits.  In June 2022, patient had CT scan of his brain which was negative.  He also had elevated platelet count at that time.  Currently patient has nonfocal neuro exam.  I discussed with him that we would like to recheck his CBC and if the platelet count is still elevated, we might want to consider advanced imaging like CT venogram.  Patient and spouse at the bedside.  Spouse also provides meaningful history.  She states that patient normally does not go to see PCP, however when he did go on Tuesday and discussed the headache, the PCP indicated to them that this is cluster headache and did not order any further work-up.  She is amenable to taking him to the PCP and have the PCP order advanced imaging if needed and do some blood work if needed.  For now, both the patient and the spouse want some relief from the pain.  We will give him IM medications.  I advised patient not to take Excedrin Migraine given that it has caffeine in it.  Tylenol for 100 mg every every 6 hours have been recommended and we have advised PCP follow-up for repeat blood work.  Strict ER return precautions have been discussed, and patient is agreeing with the plan and is comfortable with the workup done and the recommendations from the ER.  They will return to the ER if patient starts having confusion, seizures.   Final Clinical Impression(s) / ED Diagnoses Final diagnoses:  Primary stabbing headache  Thrombocytosis    Rx / DC Orders ED Discharge Orders          Ordered    acetaminophen (TYLENOL) 500 MG tablet  Every 6 hours PRN        01/19/22 1214              Derwood Kaplan, MD 01/19/22 1235

## 2022-01-19 NOTE — ED Triage Notes (Signed)
Pt to the ED with complaints of a headache for the past 5 days.  Pt was seen by the PCP on Tuesday, but was advised to take OTC pain relievers.

## 2022-01-19 NOTE — Discharge Instructions (Addendum)
We saw in the ER for headache.  It is unclear why you are having headache.  As discussed, in the past you had elevated platelet count.  We wanted to proceed with advanced imaging, you have declined that option for now.  You have preferred following up with your primary care doctor, which is also fine.  We recommend that you take Tylenol finder milligrams every 4-6 hours for pain control. Please call your primary care doctor today for an appointment next week.  Return to the ER if you start having one-sided weakness, numbness, confusion, seizures, vision change or if the headache gets worse

## 2022-07-27 ENCOUNTER — Encounter: Payer: Self-pay | Admitting: Family Medicine

## 2022-10-05 ENCOUNTER — Encounter (HOSPITAL_COMMUNITY): Payer: Self-pay | Admitting: *Deleted

## 2022-10-05 ENCOUNTER — Other Ambulatory Visit (HOSPITAL_COMMUNITY): Payer: Medicare HMO

## 2022-10-05 ENCOUNTER — Emergency Department (HOSPITAL_COMMUNITY)
Admission: EM | Admit: 2022-10-05 | Discharge: 2022-10-05 | Disposition: A | Discharge status: Home / Self Care | Payer: Medicare HMO | Attending: Emergency Medicine | Admitting: Emergency Medicine

## 2022-10-05 ENCOUNTER — Emergency Department (HOSPITAL_COMMUNITY): Payer: Medicare HMO

## 2022-10-05 ENCOUNTER — Other Ambulatory Visit: Payer: Self-pay

## 2022-10-05 DIAGNOSIS — Z7901 Long term (current) use of anticoagulants: Secondary | ICD-10-CM | POA: Insufficient documentation

## 2022-10-05 DIAGNOSIS — M545 Low back pain, unspecified: Secondary | ICD-10-CM

## 2022-10-05 NOTE — ED Notes (Signed)
Patient transported to X-ray 

## 2022-10-05 NOTE — ED Notes (Signed)
Pt d/c home with daughter per MD order, discharge summary reviewed, pt verbalizes understanding. No s/s of acute distress noted at discharge.

## 2022-10-05 NOTE — ED Provider Notes (Signed)
[name]   CSN: [id number] Arrival date & time: 10/05/22  2056     History  Chief Complaint  Patient presents with   [name]    [name] is a 78 y.o. male.   Fall   This patient is a 77 year old male, he is on Eliquis, he presents approximately 1 week after having a fall, he states he fell landing on his lower back off of a porch, he has had pain for the last week but this is not preventing him from walking or standing however he does have pain when he tries to change position.  No numbness or weakness going down the legs.  He points to just above the gluteal cleft to the location of the pain.    Home Medications Prior to Admission medications   Medication Sig Start Date End Date Taking? Authorizing Provider  acetaminophen (TYLENOL) 500 MG tablet Take 1 tablet (500 mg total) by mouth every 6 (six) hours as needed. 01/19/22   Varney Biles, MD  apixaban (ELIQUIS) 5 MG TABS tablet Take 1 tablet (5 mg total) by mouth 2 (two) times daily. 08/29/19   Orson Eva, MD  ascorbic acid (VITAMIN C) 500 MG tablet Take 1 tablet (500 mg total) by mouth daily. 08/30/19   Orson Eva, MD  Cyanocobalamin (B-12) 1000 MCG CAPS Take 1 capsule by mouth daily.  02/06/10   [provider]  Multiple Vitamin (MULTI-VITAMINS) TABS Take 1 tablet by mouth daily.     [provider]  potassium chloride (KLOR-CON) 10 MEQ tablet Take 1 tablet (10 mEq total) by mouth 2 (two) times daily for 5 days. 02/02/21 02/07/21  Joy, Shawn C, PA-C  zinc sulfate 220 (50 Zn) MG capsule Take 1 capsule (220 mg total) by mouth daily. 08/30/19   Orson Eva, MD      Allergies    Patient has no known allergies.    Review of Systems   Review of Systems  All other systems reviewed and are negative.   Physical Exam Updated Vital Signs BP 132/84 (BP Location: Right Arm)   Pulse 97   Temp 98.9 F (37.2 C) (Oral)   Resp 18   Ht 1.93 m (6' 4"$ )   Wt  49.9 kg   SpO2 100%   BMI 13.39 kg/m  Physical Exam Vitals and nursing [name] reviewed.  Constitutional:      General: He is not in acute distress.    Appearance: He is well-developed.  HENT:     Head: Normocephalic and atraumatic.     Mouth/Throat:     Pharynx: No oropharyngeal exudate.  Eyes:     General: No scleral icterus.       Right eye: No discharge.        Left eye: No discharge.     Conjunctiva/sclera: Conjunctivae normal.     Pupils: Pupils are equal, round, and reactive to light.  Neck:     Thyroid: No thyromegaly.     Vascular: No JVD.  Cardiovascular:     Rate and Rhythm: Normal rate and regular rhythm.     Heart sounds: Normal heart sounds. No murmur heard.    No friction rub. No gallop.  Pulmonary:     Effort: Pulmonary effort is normal. No respiratory distress.     Breath sounds: Normal breath sounds. No wheezing or rales.  Abdominal:     General: Bowel sounds are normal. There is no distension.  Palpations: Abdomen is soft. There is no mass.     Tenderness: There is no abdominal tenderness.  Musculoskeletal:        General: Tenderness present. Normal range of motion.     Cervical back: Normal range of motion and neck supple.     Right lower leg: No edema.     Left lower leg: No edema.     Comments: There is tenderness to palpation over the mid lower back around L5.  There is paraspinal tenderness as well  Lymphadenopathy:     Cervical: No cervical adenopathy.  Skin:    General: Skin is warm and dry.     Findings: No erythema or rash.  Neurological:     Mental Status: He is alert.     Coordination: Coordination normal.     Comments: Normal gait, normal strength and sensation of the bilateral lower extremities  Psychiatric:        Behavior: Behavior normal.     ED Results / Procedures / Treatments   Labs (all labs ordered are listed, but only abnormal results are displayed) Labs Reviewed - No data to display  EKG None  Radiology DG Lumbar  Spine Complete  Result Date: 10/05/2022 CLINICAL DATA:  Trauma, fall. EXAM: LUMBAR SPINE - COMPLETE 4+ VIEW COMPARISON:  None Available. FINDINGS: Study is limited. Patient refused lateral imaging due to pain. No visible fracture or malalignment. Lateral spurring. SI joints and hip joints symmetric. Prior cholecystectomy.  IVC filter in place. IMPRESSION: Limited study. Patient refused lateral imaging. No definite acute process. Electronically Signed   By: Rolm Baptise M.D.   On: 10/05/2022 21:42    Procedures Procedures    Medications Ordered in ED Medications - No data to display  ED Course/ Medical Decision Making/ A&P                             Medical Decision Making Amount and/or Complexity of Data Reviewed Radiology: ordered.   Patient is overall well-appearing and ambulatory but has tenderness over his lower back where he had had a fall.  Will need an x-ray to make sure there is no signs of fracture.  The patient is agreeable to the plan.  He does not have any high risk features at this time.  Patient refused lateral x-rays, AP x-rays do not show any obvious fractures.  Patient is ambulatory without neurosymptoms, he can be discharged home and follow-up as needed.  Recommended supportive care with over-the-counter anti-inflammatories or Tylenol        Final Clinical Impression(s) / ED Diagnoses Final diagnoses:  Acute midline low back pain without sciatica    Rx / DC Orders ED Discharge Orders     None         Noemi Chapel, MD 10/05/22 2205

## 2022-10-05 NOTE — ED Triage Notes (Signed)
Pt fell backwards off a porch few feet high, landing ground with grass happened this past Tuesday. Denies hitting his head.  C/o lower back pain since.

## 2022-10-05 NOTE — Discharge Instructions (Addendum)
Your x-rays do not definitively show any broken bones.  You may take Tylenol or ibuprofen, because the x-rays were not completed I cannot tell you for sure that there was not a broken bone but there was nothing obvious on the x-ray.  See your doctor if no better in 1 week but understand that this may take some time to improve  Return to the emergency department for severe or worsening pain, weakness, numbness or difficulty urinating

## 2024-01-09 ENCOUNTER — Ambulatory Visit: Admitting: Urology

## 2024-01-09 VITALS — BP 106/75 | HR 88 | Ht 76.0 in | Wt 202.5 lb

## 2024-01-09 DIAGNOSIS — Z125 Encounter for screening for malignant neoplasm of prostate: Secondary | ICD-10-CM | POA: Diagnosis not present

## 2024-01-09 DIAGNOSIS — R350 Frequency of micturition: Secondary | ICD-10-CM | POA: Diagnosis not present

## 2024-01-09 DIAGNOSIS — N401 Enlarged prostate with lower urinary tract symptoms: Secondary | ICD-10-CM

## 2024-01-09 DIAGNOSIS — R3911 Hesitancy of micturition: Secondary | ICD-10-CM | POA: Diagnosis not present

## 2024-01-09 LAB — BLADDER SCAN AMB NON-IMAGING: Scan Result: 49

## 2024-01-09 MED ORDER — TAMSULOSIN HCL 0.4 MG PO CAPS: 0.4000 mg | ORAL_CAPSULE | Freq: Every day | ORAL | 11 refills | Status: DC

## 2024-01-09 NOTE — Progress Notes (Signed)
 Elfrieda Grise Plume,acting as a scribe for [name] Gimenez, MD.,have documented all relevant documentation on the behalf of [name] Gimenez, MD,as directed by  [name] Gimenez, MD while in the presence of [name] Gimenez, MD.  01/09/24 1:31 PM   [name] 15-Apr-1946 [id number]  Referring provider: Aneita Baptise, PA [address] [place],  [place] [address]  Chief Complaint  Patient presents with   New Patient (Initial Visit)   urinary hesitency    HPI: 78 year old male with a personal history of chronic back pain, DVT, tobacco abuse, COPD, and CHF presents today for evaluation of urinary hesitancy. He was referred by Aneita Baptise, his primary care PA.  He reports difficulty starting his stream, weak stream, and increased frequency of urination. He sometimes feels the need to urinate again shortly after voiding, with the second void being minimal. These symptoms have been present intermittently for four to five months, with recent exacerbation over the past two to three weeks. He denies any leg weakness or numbness. He has a history of consuming large amounts of sodas and fruit juices, but has since reduced his intake. He is not currently on any BPH medications. He is concerned about the possibility of prostate issues, as he has been informed that his prostate is enlarged. He has not had any recent prostate cancer screening.  He experiences back pain, which improves with movement in the morning.    He is accompanied by a family member who provides additional history.  Results for orders placed or performed in visit on 01/09/24  Bladder Scan (Post Void Residual) in office  Result Value Ref Range   Scan Result 49 ml       IPSS     Row Name 01/09/24 1000         International Prostate Symptom Score   How often have you had the sensation of not emptying your bladder? Less than 1 in 5     How often have you had to urinate less than every two hours? About half the time      How often have you found you stopped and started again several times when you urinated? Less than 1 in 5 times     How often have you found it difficult to postpone urination? Less than half the time     How often have you had a weak urinary stream? About half the time     How often have you had to strain to start urination? Not at All     How many times did you typically get up at night to urinate? 2 Times     Total IPSS Score 12       Quality of Life due to urinary symptoms   If you were to spend the rest of your life with your urinary condition just the way it is now how would you feel about that? Mostly Disatisfied              Score:  1-7 Mild 8-19 Moderate 20-35 Severe   PMH: Past Medical History:  Diagnosis Date   Allergy    DVT (deep venous thrombosis) (HCC)    Peripheral artery disease (HCC)    Status post ablation of incompetent vein using laser    Left Leg   Varicose veins of both lower extremities     Surgical History: Past Surgical History:  Procedure Laterality Date   CHOLECYSTECTOMY     FOOT SURGERY Right  HAND AMPUTATION Left    IVC FILTER PLACEMENT ([facility] HX)     SPLENECTOMY, TOTAL      Home Medications:  Allergies as of 01/09/2024   No Known Allergies      Medication List        Accurate as of Jan 09, 2024  1:31 PM. If you have any questions, ask your nurse or doctor.          STOP taking these medications    apixaban  5 MG Tabs tablet Commonly known as: ELIQUIS    potassium chloride  10 MEQ tablet Commonly known as: KLOR-CON    zinc  sulfate (50mg  elemental zinc ) 220 (50 Zn) MG capsule       TAKE these medications    acetaminophen  500 MG tablet Commonly known as: TYLENOL  Take 1 tablet (500 mg total) by mouth every 6 (six) hours as needed.   ascorbic acid  500 MG tablet Commonly known as: VITAMIN C Take 1 tablet (500 mg total) by mouth daily.   B-12 1000 MCG Caps Take 1 capsule by mouth daily.   Multi-Vitamins Tabs Take  1 tablet by mouth daily.   tamsulosin  0.4 MG Caps capsule Commonly known as: FLOMAX  Take 1 capsule (0.4 mg total) by mouth daily.         Social History:  reports that he has quit smoking. He has never used smokeless tobacco. He reports that he does not drink alcohol and does not use drugs.   Physical Exam: BP 106/75   Pulse 88   Ht 6\' 4"  (1.93 m)   Wt 202 lb 8 oz (91.9 kg)   BMI 24.65 kg/m   Constitutional:  Alert and oriented, No acute distress. HEENT: [place] AT, moist mucus membranes.  Trachea midline, no masses. GU: Prostate is enlarged, no nodules felt. Neurologic: Grossly intact, no focal deficits, moving all 4 extremities. Psychiatric: Normal mood and affect.   Assessment & Plan:    1. BPH with LUTS - He presents with symptoms of weak urinary stream, frequency, and incomplete bladder emptying, consistent with BPH.  - A digital rectal exam revealed an enlarged prostate without nodules.  - Initiate treatment with Flomax  0.4 mg daily to improve urinary flow and bladder emptying.  - Education provided on potential side effects, including dizziness and retrograde ejaculation.  - A urine sample is needed to rule out any urinary tract infection or other issues. He was unable to provide a sample today but will be prepared to provide one at the follow-up visit. - Follow-up in six weeks to assess the effectiveness of the medication and adjust treatment as necessary.  2. Prostate Cancer Screening - Although routine PSA screening is not typically performed at age 54, given the recent onset of urinary symptoms, a PSA test will be conducted to rule out prostate cancer.  - A rectal exam today showed no nodules, which is reassuring.  3. Chronic Back Pain - He reports chronic back pain that improves with movement, suggestive of possible arthritis.  - No new treatment plan was discussed during this visit.   Return in about 6 weeks (around 02/20/2024) for PA visit with UA, IPSS,  PVR.  I have reviewed the above documentation for accuracy and completeness, and I agree with the above.   [name] Gimenez, MD    [facility] [address], [address] [place], [place] [address] [phone]

## 2024-01-10 LAB — PSA: Prostate Specific Ag, Serum: 6 ng/mL — ABNORMAL HIGH (ref 0.0–4.0)

## 2024-02-03 ENCOUNTER — Telehealth: Payer: Self-pay | Admitting: Urology

## 2024-02-03 NOTE — Telephone Encounter (Signed)
 Patient's wife called stating that patient had a reaction to

## 2024-02-03 NOTE — Telephone Encounter (Signed)
 Patient's wife called and stated that patient has had side effects of fever and vomiting from the Flomax  that was prescribed. Wife states that patient stopped taking RX about 5 days ago and will keep his 02/20/24 appointment. Wife states that patient is feeling better now and wants to know if he should continue not taking RX or should he be taking it? Please advise patient.

## 2024-02-03 NOTE — Telephone Encounter (Signed)
 Fever and vomiting are not common side effects of Flomax . I wonder if he contracted a virus around the time he started the medication. I'd recommend he try the medicine again to see if he tolerates it better now that his other symptoms have resolved. If fever and vomiting recur, ok to stop the Flomax  for good.

## 2024-02-04 NOTE — Telephone Encounter (Signed)
 Pt informed, voiced understanding

## 2024-02-20 ENCOUNTER — Ambulatory Visit (INDEPENDENT_AMBULATORY_CARE_PROVIDER_SITE_OTHER): Admitting: Physician Assistant

## 2024-02-20 ENCOUNTER — Encounter: Payer: Self-pay | Admitting: Physician Assistant

## 2024-02-20 VITALS — BP 129/77 | HR 88 | Ht 76.0 in | Wt 199.6 lb

## 2024-02-20 DIAGNOSIS — R3911 Hesitancy of micturition: Secondary | ICD-10-CM

## 2024-02-20 DIAGNOSIS — R972 Elevated prostate specific antigen [PSA]: Secondary | ICD-10-CM | POA: Diagnosis not present

## 2024-02-20 DIAGNOSIS — N401 Enlarged prostate with lower urinary tract symptoms: Secondary | ICD-10-CM

## 2024-02-20 LAB — URINALYSIS, COMPLETE
Bilirubin, UA: NEGATIVE
Glucose, UA: NEGATIVE
Ketones, UA: NEGATIVE
Leukocytes,UA: NEGATIVE
Nitrite, UA: NEGATIVE
RBC, UA: NEGATIVE
Specific Gravity, UA: 1.01 (ref 1.005–1.030)
Urobilinogen, Ur: 1 mg/dL (ref 0.2–1.0)
pH, UA: 6 (ref 5.0–7.5)

## 2024-02-20 LAB — MICROSCOPIC EXAMINATION: Bacteria, UA: NONE SEEN

## 2024-02-20 LAB — BLADDER SCAN AMB NON-IMAGING

## 2024-02-20 MED ORDER — TADALAFIL 5 MG PO TABS: 5.0000 mg | ORAL_TABLET | Freq: Every day | ORAL | 11 refills | Status: DC

## 2024-02-20 NOTE — Progress Notes (Signed)
 02/20/2024 10:16 AM   [name] 1946/04/16 [id number]  CC: Chief Complaint  Patient presents with   Follow-up   HPI: [name] is a 78 y.o. male with PMH chronic back pain, DVT, tobacco abuse, COPD, CHF, pulmonary hypertension, and BPH with LUTS who presents today for symptom recheck on Flomax .  He is a today by his wife, who provides the majority of HPI.  Notably, when he saw Dr. Penne last month, a one-time PSA resulted at 6.0.  No historical values available for review.  Today they report that he developed palpitations and vomiting twice while on Flomax , and ultimately stopped it after 2 weeks.  It had been helping with his voiding symptoms.  These episodes have not recurred since stopping the alpha-blocker.  He switched to an OTC Ideal Prostate+ supplement that contains saw palmetto a couple of weeks ago.  He reports he is still having occasional weak stream.  In-office UA today positive for trace protein; urine microscopy pan negative. PVR 84mL.  PMH: Past Medical History:  Diagnosis Date   Allergy    DVT (deep venous thrombosis) (HCC)    Peripheral artery disease (HCC)    Pulmonary hypertension (HCC)    Status post ablation of incompetent vein using laser    Left Leg   Varicose veins of both lower extremities     Surgical History: Past Surgical History:  Procedure Laterality Date   CHOLECYSTECTOMY     FOOT SURGERY Right    HAND AMPUTATION Left    IVC FILTER PLACEMENT ([facility] HX)     SPLENECTOMY, TOTAL      Home Medications:  Allergies as of 02/20/2024   No Known Allergies      Medication List        Accurate as of February 20, 2024 10:16 AM. If you have any questions, ask your nurse or doctor.          acetaminophen  500 MG tablet Commonly known as: TYLENOL  Take 1 tablet (500 mg total) by mouth every 6 (six) hours as needed.   ascorbic acid  500 MG tablet Commonly known as: VITAMIN C Take 1 tablet (500 mg total) by mouth daily.    B-12 1000 MCG Caps Take 1 capsule by mouth daily.   Multi-Vitamins Tabs Take 1 tablet by mouth daily.   tamsulosin  0.4 MG Caps capsule Commonly known as: FLOMAX  Take 1 capsule (0.4 mg total) by mouth daily.        Allergies:  No Known Allergies  Family History: No family history on file.  Social History:   reports that he has quit smoking. He has never used smokeless tobacco. He reports that he does not drink alcohol and does not use drugs.  Physical Exam: BP 129/77   Pulse 88   Ht 6' 4 (1.93 m)   Wt 199 lb 9.6 oz (90.5 kg)   BMI 24.30 kg/m   Constitutional:  Alert and oriented, no acute distress, nontoxic appearing HEENT: [place], AT Cardiovascular: No clubbing, cyanosis, or edema Respiratory: Normal respiratory effort, no increased work of breathing Skin: No rashes, bruises or suspicious lesions Neurologic: Grossly intact, no focal deficits, moving all 4 extremities Psychiatric: Normal mood and affect  Laboratory Data: Results for orders placed or performed in visit on 02/20/24  Microscopic Examination   Collection Time: 02/20/24 10:08 AM   Urine  Result Value Ref Range   WBC, UA 0-5 0 - 5 /hpf   RBC, Urine 0-2 0 - 2 /hpf  Epithelial Cells (non renal) 0-10 0 - 10 /hpf   Bacteria, UA None seen None seen/Few  Urinalysis, Complete   Collection Time: 02/20/24 10:08 AM  Result Value Ref Range   Specific Gravity, UA 1.010 1.005 - 1.030   pH, UA 6.0 5.0 - 7.5   Color, UA Yellow Yellow   Appearance Ur Clear Clear   Leukocytes,UA Negative Negative   Protein,UA Trace Negative/Trace   Glucose, UA Negative Negative   Ketones, UA Negative Negative   RBC, UA Negative Negative   Bilirubin, UA Negative Negative   Urobilinogen, Ur 1.0 0.2 - 1.0 mg/dL   Nitrite, UA Negative Negative   Microscopic Examination Comment    Microscopic Examination See below:   BLADDER SCAN AMB NON-IMAGING   Collection Time: 02/20/24 10:13 AM  Result Value Ref Range   Scan Result 84ml     Assessment & Plan:   1. Benign prostatic hyperplasia with urinary hesitancy (Primary) Unable to tolerate alpha blockers due to side effects.  Will try low-dose daily tadalafil as an alternative.  He is emptying appropriately and UA is bland today. - BLADDER SCAN AMB NON-IMAGING - Urinalysis, Complete - tadalafil (CIALIS) 5 MG tablet; Take 1 tablet (5 mg total) by mouth daily.  Dispense: 30 tablet; Refill: 11  2. Elevated PSA PSA is appropriate at 6.0 in light of his BPH and age, though with new obstructive voiding symptoms over the past several months, we will plan to repeat PSA in 5 months to trend.  If stable, will discontinue PSA screening. - PSA Total (Reflex To Free); Future   Return in about 5 months (around 07/22/2024) for IPSS, PVR, PSA prior.  Lucie Hones, PA-C  [facility] [place] [address] [place], [place] [address] [phone]

## 2024-07-15 ENCOUNTER — Other Ambulatory Visit

## 2024-07-15 DIAGNOSIS — R972 Elevated prostate specific antigen [PSA]: Secondary | ICD-10-CM

## 2024-07-16 LAB — FPSA% REFLEX
% FREE PSA: 13 %
PSA, FREE: 0.78 ng/mL

## 2024-07-16 LAB — PSA TOTAL (REFLEX TO FREE): Prostate Specific Ag, Serum: 6 ng/mL — ABNORMAL HIGH (ref 0.0–4.0)

## 2024-07-22 ENCOUNTER — Ambulatory Visit: Admitting: Physician Assistant

## 2024-07-22 VITALS — BP 127/77 | HR 56 | Ht 76.0 in | Wt 198.0 lb

## 2024-07-22 DIAGNOSIS — R3911 Hesitancy of micturition: Secondary | ICD-10-CM

## 2024-07-22 DIAGNOSIS — N401 Enlarged prostate with lower urinary tract symptoms: Secondary | ICD-10-CM | POA: Diagnosis not present

## 2024-07-22 DIAGNOSIS — R972 Elevated prostate specific antigen [PSA]: Secondary | ICD-10-CM

## 2024-07-22 LAB — BLADDER SCAN AMB NON-IMAGING: Scan Result: 100

## 2024-07-22 MED ORDER — FINASTERIDE 5 MG PO TABS: 5.0000 mg | ORAL_TABLET | Freq: Every day | ORAL | 3 refills | Status: AC

## 2024-07-22 NOTE — Progress Notes (Signed)
 07/22/2024 10:26 AM   [name] September 11, 1945 [id number]  CC: Chief Complaint  Patient presents with   5 mos sx recheck   HPI: [name] is a 78 y.o. male with PMH chronic back pain, DVT, tobacco abuse, COPD, CHF, pulmonary hypertension, and BPH with LUTS who failed Flomax  due to side effects who presents today for symptom recheck on tadalafil  5 mg daily.  He is accompanied today by his wife, who provides the majority of HPI.  Today he reports ongoing urinary hesitancy and urgency.  He reports that his PCP advised him to stop daily Cialis  due to hypotension.  PSA stable at 6.0, 13% free.  He had a benign DRE with Dr. Penne 6 months ago.  IPSS 14/unhappy as below.  PVR 100 mL.   IPSS     Row Name 07/22/24 1000         International Prostate Symptom Score   How often have you had the sensation of not emptying your bladder? Less than 1 in 5     How often have you had to urinate less than every two hours? Less than half the time     How often have you found you stopped and started again several times when you urinated? Less than half the time     How often have you found it difficult to postpone urination? More than half the time     How often have you had a weak urinary stream? Less than half the time     How often have you had to strain to start urination? Not at All     Total IPSS Score 11       Quality of Life due to urinary symptoms   If you were to spend the rest of your life with your urinary condition just the way it is now how would you feel about that? Unhappy         PMH: Past Medical History:  Diagnosis Date   Allergy    DVT (deep venous thrombosis) (HCC)    Peripheral artery disease    Pulmonary hypertension (HCC)    Status post ablation of incompetent vein using laser    Left Leg   Varicose veins of both lower extremities     Surgical History: Past Surgical History:  Procedure Laterality Date   CHOLECYSTECTOMY     FOOT SURGERY  Right    HAND AMPUTATION Left    IVC FILTER PLACEMENT ([facility] HX)     SPLENECTOMY, TOTAL      Home Medications:  Allergies as of 07/22/2024   No Known Allergies      Medication List        Accurate as of July 22, 2024 10:26 AM. If you have any questions, ask your nurse or doctor.          acetaminophen  500 MG tablet Commonly known as: TYLENOL  Take 1 tablet (500 mg total) by mouth every 6 (six) hours as needed.   ascorbic acid  500 MG tablet Commonly known as: VITAMIN C Take 1 tablet (500 mg total) by mouth daily.   B-12 1000 MCG Caps Take 1 capsule by mouth daily.   Multi-Vitamins Tabs Take 1 tablet by mouth daily.   tadalafil  5 MG tablet Commonly known as: CIALIS  Take 1 tablet (5 mg total) by mouth daily.        Allergies:  No Known Allergies  Family History: No family history on file.  Social History:  reports that he has quit smoking. He has never used smokeless tobacco. He reports that he does not drink alcohol and does not use drugs.  Physical Exam: BP 127/77   Pulse (!) 56   Ht 6' 4 (1.93 m)   Wt 198 lb (89.8 kg)   SpO2 93%   BMI 24.10 kg/m   Constitutional:  Alert and oriented, no acute distress, nontoxic appearing HEENT: [place], AT Cardiovascular: No clubbing, cyanosis, or edema Respiratory: Normal respiratory effort, no increased work of breathing Skin: No rashes, bruises or suspicious lesions Neurologic: Grossly intact, no focal deficits, moving all 4 extremities Psychiatric: Normal mood and affect  Laboratory Data: Results for orders placed or performed in visit on 07/22/24  Bladder Scan (Post Void Residual) in office   Collection Time: 07/22/24 10:09 AM  Result Value Ref Range   Scan Result 100    Assessment & Plan:   1. Benign prostatic hyperplasia with urinary hesitancy (Primary) He has failed Flomax  and tadalafil .  He continues to have bothersome voiding symptoms, but is emptying appropriately.  We discussed options including  finasteride  versus cystoscopy for consideration of outlet procedures and they prefer the former, which is reasonable.  We discussed that this would be a lifelong medication and that onset would be gradual over the next 6 to 9 months. - Bladder Scan (Post Void Residual) in office - finasteride  (PROSCAR ) 5 MG tablet; Take 1 tablet (5 mg total) by mouth daily.  Dispense: 90 tablet; Refill: 3  2. Elevated PSA Stable, age-appropriate, however free PSA is rather low.  In light of his comorbidities, we will monitor this with another PSA in 6 months on finasteride .  Will only pursue additional workup if his PSA does not drop as anticipated on finasteride .  Return in about 6 months (around 01/19/2025) for IPSS/PVR/PSA.  Lucie Hones, PA-C  [facility] [place] [address], [address] [place], [place] [address] [phone]

## 2024-09-10 ENCOUNTER — Encounter: Payer: Self-pay | Admitting: Internal Medicine

## 2024-09-14 NOTE — Progress Notes (Unsigned)
 " Cardiology Office Note   Date:  09/15/2024  ID:  Eloy, Fehl [birth year], MRN [id number] PCP: Buren Rock HERO, MD  [facility] Providers Cardiologist:  Caron Poser, MD     History of Present Illness [name] is a 79 y.o. 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  Seen by PCP for this issue 09/09/2024.  Previously followed with [facility]; last seen 01/2023.  He was supposed to establish care with the [facility] clinic but this never occurred.  No recent LDL on file.  Patient and wife report that he is generally feeling well.  He was previously on oxygen for CTEPH but after a few years of treatment, he no longer needed oxygen or any of his previous PAH therapies.  His only complaint today is occasional palpitations which occur on a weekly basis.  He also notes that he sometimes gets very tired after working in the yard or working on his car.  Any syncope.  Denies any dyspnea with routine activities  Relevant CVD History -TTE 02/2023 LVEF 50 to 55%, mildly dilated RV with normal function with PASP 24, moderate MAC, mildly dilated ascending aorta -TTE 09/2016 normal biventricular function, mild MR, mild TR -TTE 04/2015 normal biventricular function, mild MR, moderate TR; markedly improved biventricular function from study 09/2014 -Invasive pulmonary angiogram 10/2014 pruning of the peripheral pulmonary arteries bilaterally with dilation of the main pulmonary arteries, compatible with pulmonary arterial hypertension. -CT scan PE study 09/2014: No evidence of acute or chronic PE and large vessels. 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  -Right heart catheterization February 2016 (Dr. Dodie): RA: 16 mmHg (mean), RV: 72/ 10 mmHg, PA: 72/ 34 48 mmHg, (mean), PCWP:  7 mmHg (mean), AV O2: 6.6 vol%, Cardiac output: 4.3 L/min, Cardiac index: 1.8 L/min-m2, PVR: 9.4 Wood units.  -TTE 09/2014: EF 40%, moderate global decrease in left ventricular contractility with hypo-kinetic walls. Severely enlarged right ventricle with global decrease in wall motion with TAPSE 1.1 cm. RVSP 103 mmHg.    ROS: Pt denies any chest discomfort, jaw pain, arm pain, presyncope, orthopnea, PND, or LE edema.  Studies Reviewed I have independently reviewed the patient's ECG, previous cardiac studies, previous medical records, previous blood work.  Physical Exam VS:  BP (!) 90/58 (BP Location: Right Arm, Patient Position: Sitting, Cuff Size: Normal)   Pulse 93   Ht 6' 4 (1.93 m)   Wt 206 lb (93.4 kg)   SpO2 98%   BMI 25.08 kg/m        Wt Readings from Last 3 Encounters:  09/15/24 206 lb (93.4 kg)  07/22/24 198 lb (89.8 kg)  02/20/24 199 lb 9.6 oz (90.5 kg)    GEN: No acute distress. NECK: No JVD; No carotid bruits. CARDIAC: RRR, no murmurs, rubs, gallops. RESPIRATORY:  Clear to auscultation. EXTREMITIES:  Warm and well-perfused. No edema.  ASSESSMENT AND PLAN History of CTEPH History of severe pulmonary hypertension History of heart failure with recovered ejection fraction Paroxysmal tachycardia Palpitation Patient presents to establish care.  He does have some hypotension, but this is essentially asymptomatic.  He denies any signs or symptoms of heart failure.  Somewhat remarkable, he was previously on oxygen and PAH therapies but has not been on either of these things (including anticoagulation) for about the past 5 years. His IVC  filter is reportedly still in place. The only symptoms he reports today are occasional palpitations and occasional dyspnea with significant exertion; he is able to do most of his daily activities without limitation.   Plan: - Monitor to evaluate for arrhythmia - Echocardiogram to recheck LV and RV function - We will hold off on any GDMT  given minimal symptoms and documented recovery of ejection fraction while off of medications - Discussed obtaining a right heart catheterization; they would like to hold off for now since he is not very symptomatic and obtain noninvasive studies first.  We will plan to discuss this at next visit. - If his echo shows significant RV dysfunction and right heart cath redemonstrates severe PAH, then we will refer him to our advanced heart failure team for further care and consideration of pulmonary vasodilator therapy; hypotension may limit options        Dispo: RTC 3 months to review cardiac studies and discuss utility of right heart catheterization  Signed, Caron Poser, MD  "

## 2024-09-15 ENCOUNTER — Encounter: Payer: Self-pay | Admitting: Family Medicine

## 2024-09-15 ENCOUNTER — Ambulatory Visit

## 2024-09-15 VITALS — BP 90/58 | HR 93 | Ht 76.0 in | Wt 206.0 lb

## 2024-09-15 DIAGNOSIS — R002 Palpitations: Secondary | ICD-10-CM

## 2024-09-15 DIAGNOSIS — I2724 Chronic thromboembolic pulmonary hypertension: Secondary | ICD-10-CM | POA: Diagnosis not present

## 2024-09-15 DIAGNOSIS — I272 Pulmonary hypertension, unspecified: Secondary | ICD-10-CM

## 2024-09-15 DIAGNOSIS — I5020 Unspecified systolic (congestive) heart failure: Secondary | ICD-10-CM | POA: Diagnosis not present

## 2024-09-15 DIAGNOSIS — Z8679 Personal history of other diseases of the circulatory system: Secondary | ICD-10-CM

## 2024-09-15 DIAGNOSIS — I479 Paroxysmal tachycardia, unspecified: Secondary | ICD-10-CM | POA: Diagnosis not present

## 2024-09-15 NOTE — Patient Instructions (Signed)
 Medication Instructions:  Your physician recommends that you continue on your current medications as directed. Please refer to the Current Medication list given to you today.  *If you need a refill on your cardiac medications before your next appointment, please call your pharmacy*  Lab Work: No labs ordered today  If you have labs (blood work) drawn today and your tests are completely normal, you will receive your results only by: MyChart Message (if you have MyChart) OR A paper copy in the mail If you have any lab test that is abnormal or we need to change your treatment, we will call you to review the results.  Testing/Procedures: Your physician has requested that you have an echocardiogram. Echocardiography is a painless test that uses sound waves to create images of your heart. It provides your doctor with information about the size and shape of your heart and how well your heart's chambers and valves are working.   You may receive an ultrasound enhancing agent through an IV if needed to better visualize your heart during the echo. This procedure takes approximately one hour.  There are no restrictions for this procedure.  This will take place at [address] [facility]) [address], [place] [address]  Please note: We ask at that you not bring children with you during ultrasound (echo/ vascular) testing. Due to room size and safety concerns, children are not allowed in the ultrasound rooms during exams. Our front office staff cannot provide observation of children in our lobby area while testing is being conducted. An adult accompanying a patient to their appointment will only be allowed in the ultrasound room at the discretion of the ultrasound technician under special circumstances. We apologize for any inconvenience.   ZIO XT- Long Term Monitor Instructions  Your physician has requested you wear a ZIO patch monitor for 14 days.  This is a single patch monitor. [employer]  supplies one patch monitor per enrollment. Additional stickers are not available. Please do not apply patch if you will be having a Nuclear Stress Test, Echocardiogram, Cardiac CT, MRI, or Chest Xray during the period you would be wearing the monitor. The patch cannot be worn during these tests. You cannot remove and re-apply the ZIO XT patch monitor.  Your ZIO patch monitor will be mailed 3 day USPS to your address on file. It may take 3-5 days to receive your monitor after you have been enrolled. Once you have received your monitor, please review the enclosed instructions. Your monitor has already been registered assigning a specific monitor serial number to you.  Billing and Patient Assistance Program Information  We have supplied [employer] with any of your insurance information on file for billing purposes.  [employer] offers a sliding scale Patient Assistance Program for patients that do not have insurance, or whose insurance does not completely cover the cost of the ZIO monitor.  You must apply for the Patient Assistance Program to qualify for this discounted rate.  To apply, please call [employer] at [phone], select option 4, select option 2, ask to apply for Patient Assistance Program. Meredeth will ask your household income, and how many people are in your household. They will quote your out-of-pocket cost based on that information. [employer] will also be able to set up a 72-month, interest-free payment plan if needed.  Applying the monitor   Shave hair from upper left chest.  Hold abrader disc by orange tab. Rub abrader in 40 strokes over the upper left chest as indicated in  your monitor instructions.  Clean area with 4 enclosed alcohol pads. Let dry.  Apply patch as indicated in monitor instructions. Patch will be placed under collarbone on left side of chest with arrow pointing upward.  Rub patch adhesive wings for 2 minutes. Remove white label marked 1. Remove the white label marked 2. Rub  patch adhesive wings for 2 additional minutes.  While looking in a mirror, press and release button in center of patch. A small green light will flash 3-4 times. This will be your only indicator that the monitor has been turned on.   After Applying Monitor: Do not shower for the first 24 hours. You may shower after the first 24 hours.  Press the button if you feel a symptom. You will hear a small click. Record Date, Time and Symptom in the Patient Logbook.   After Completing 14 Days: When you are ready to remove the patch, follow instructions on the last 2 pages of Patient Logbook.  Stick patch monitor into the tabs at the bottom of the return box.  Place Patient Logbook in the blue and white box. Use locking tab on box and tape box closed securely. The blue and white box has prepaid postage on it. Please place it in the mailbox as soon as possible. Your physician should have your test results approximately 7-14 days after the monitor has been mailed back to [facility].   Troubleshooting: Call [facility] at [phone] if you have questions regarding your ZIO XT patch monitor.  Call them immediately if you see an orange light blinking on your monitor.  If your monitor falls off in less than 4 days, contact our Monitor department at [phone].  If your monitor becomes loose or falls off after 4 days call [employer] at [phone] for suggestions on securing your monitor.    Follow-Up: At [facility], you and your health needs are our priority.  As part of our continuing mission to provide you with exceptional heart care, our providers are all part of one team.  This team includes your primary Cardiologist (physician) and Advanced Practice Providers or APPs (Physician Assistants and Nurse Practitioners) who all work together to provide you with the care you need, when you need it.  Your next appointment:   3 month(s)  Provider:   Caron Poser, MD   We  recommend signing up for the patient portal called MyChart.  Sign up information is provided on this After Visit Summary.  MyChart is used to connect with patients for Virtual Visits (Telemedicine).  Patients are able to view lab/test results, encounter notes, upcoming appointments, etc.  Non-urgent messages can be sent to your provider as well.   To learn more about what you can do with MyChart, go to [email or web address].

## 2024-09-16 ENCOUNTER — Inpatient Hospital Stay

## 2024-09-16 ENCOUNTER — Inpatient Hospital Stay: Admitting: Internal Medicine

## 2024-09-23 ENCOUNTER — Inpatient Hospital Stay: Admitting: Internal Medicine

## 2024-09-23 ENCOUNTER — Inpatient Hospital Stay

## 2024-09-28 ENCOUNTER — Ambulatory Visit

## 2024-12-14 ENCOUNTER — Ambulatory Visit

## 2025-01-19 ENCOUNTER — Ambulatory Visit: Admitting: Physician Assistant
